# Patient Record
Sex: Female | Born: 1965 | Race: White | Hispanic: No | Marital: Married | Smoking: Current some day smoker
Health system: Southern US, Community
[De-identification: ages and names within clinical notes are randomized; demographics above are authoritative.]

## PROBLEM LIST (undated history)

## (undated) ENCOUNTER — Emergency Department (HOSPITAL_COMMUNITY): Admission: EM | Payer: Medicaid Other | Source: Home / Self Care

## (undated) DIAGNOSIS — F32A Depression, unspecified: Secondary | ICD-10-CM

## (undated) DIAGNOSIS — H5440 Blindness, one eye, unspecified eye: Secondary | ICD-10-CM

## (undated) DIAGNOSIS — M5134 Other intervertebral disc degeneration, thoracic region: Secondary | ICD-10-CM

## (undated) DIAGNOSIS — N186 End stage renal disease: Secondary | ICD-10-CM

## (undated) DIAGNOSIS — Z91148 Patient's other noncompliance with medication regimen for other reason: Secondary | ICD-10-CM

## (undated) DIAGNOSIS — K219 Gastro-esophageal reflux disease without esophagitis: Secondary | ICD-10-CM

## (undated) DIAGNOSIS — M1990 Unspecified osteoarthritis, unspecified site: Secondary | ICD-10-CM

## (undated) DIAGNOSIS — R0600 Dyspnea, unspecified: Secondary | ICD-10-CM

## (undated) DIAGNOSIS — Z9114 Patient's other noncompliance with medication regimen: Secondary | ICD-10-CM

## (undated) DIAGNOSIS — E11319 Type 2 diabetes mellitus with unspecified diabetic retinopathy without macular edema: Secondary | ICD-10-CM

## (undated) DIAGNOSIS — G4733 Obstructive sleep apnea (adult) (pediatric): Secondary | ICD-10-CM

## (undated) DIAGNOSIS — M109 Gout, unspecified: Secondary | ICD-10-CM

## (undated) DIAGNOSIS — F319 Bipolar disorder, unspecified: Secondary | ICD-10-CM

## (undated) DIAGNOSIS — D649 Anemia, unspecified: Secondary | ICD-10-CM

## (undated) DIAGNOSIS — K7460 Unspecified cirrhosis of liver: Secondary | ICD-10-CM

## (undated) DIAGNOSIS — N189 Chronic kidney disease, unspecified: Secondary | ICD-10-CM

## (undated) DIAGNOSIS — F410 Panic disorder [episodic paroxysmal anxiety] without agoraphobia: Secondary | ICD-10-CM

## (undated) DIAGNOSIS — G5603 Carpal tunnel syndrome, bilateral upper limbs: Secondary | ICD-10-CM

## (undated) DIAGNOSIS — E669 Obesity, unspecified: Secondary | ICD-10-CM

## (undated) DIAGNOSIS — F329 Major depressive disorder, single episode, unspecified: Secondary | ICD-10-CM

## (undated) DIAGNOSIS — K5900 Constipation, unspecified: Secondary | ICD-10-CM

## (undated) DIAGNOSIS — G2581 Restless legs syndrome: Secondary | ICD-10-CM

## (undated) DIAGNOSIS — I509 Heart failure, unspecified: Secondary | ICD-10-CM

## (undated) DIAGNOSIS — J449 Chronic obstructive pulmonary disease, unspecified: Secondary | ICD-10-CM

## (undated) DIAGNOSIS — M5412 Radiculopathy, cervical region: Secondary | ICD-10-CM

## (undated) DIAGNOSIS — I10 Essential (primary) hypertension: Secondary | ICD-10-CM

## (undated) HISTORY — PX: TUBAL LIGATION: SHX77

## (undated) HISTORY — PX: ENDOMETRIAL ABLATION: SHX621

## (undated) HISTORY — PX: CATARACT EXTRACTION: SUR2

---

## 2000-11-03 ENCOUNTER — Ambulatory Visit (HOSPITAL_COMMUNITY): Admission: RE | Admit: 2000-11-03 | Discharge: 2000-11-03 | Payer: Self-pay | Admitting: Pulmonary Disease

## 2000-11-03 ENCOUNTER — Encounter: Payer: Self-pay | Admitting: Obstetrics and Gynecology

## 2001-05-12 ENCOUNTER — Emergency Department (HOSPITAL_COMMUNITY): Admission: EM | Admit: 2001-05-12 | Discharge: 2001-05-12 | Payer: Self-pay | Admitting: *Deleted

## 2001-07-22 ENCOUNTER — Emergency Department (HOSPITAL_COMMUNITY): Admission: EM | Admit: 2001-07-22 | Discharge: 2001-07-22 | Payer: Self-pay | Admitting: Emergency Medicine

## 2002-05-26 ENCOUNTER — Encounter: Payer: Self-pay | Admitting: Emergency Medicine

## 2002-05-26 ENCOUNTER — Emergency Department (HOSPITAL_COMMUNITY): Admission: EM | Admit: 2002-05-26 | Discharge: 2002-05-26 | Payer: Self-pay | Admitting: Emergency Medicine

## 2003-02-03 ENCOUNTER — Ambulatory Visit (HOSPITAL_COMMUNITY): Admission: RE | Admit: 2003-02-03 | Discharge: 2003-02-03 | Payer: Self-pay | Admitting: *Deleted

## 2003-02-03 ENCOUNTER — Encounter: Payer: Self-pay | Admitting: *Deleted

## 2004-07-24 ENCOUNTER — Emergency Department (HOSPITAL_COMMUNITY): Admission: EM | Admit: 2004-07-24 | Discharge: 2004-07-24 | Payer: Self-pay | Admitting: Emergency Medicine

## 2005-02-23 ENCOUNTER — Ambulatory Visit: Admission: RE | Admit: 2005-02-23 | Discharge: 2005-02-23 | Payer: Self-pay | Admitting: Pulmonary Disease

## 2005-03-06 ENCOUNTER — Ambulatory Visit: Payer: Self-pay | Admitting: Pulmonary Disease

## 2006-10-06 ENCOUNTER — Emergency Department (HOSPITAL_COMMUNITY): Admission: EM | Admit: 2006-10-06 | Discharge: 2006-10-06 | Payer: Self-pay | Admitting: Emergency Medicine

## 2007-02-01 ENCOUNTER — Ambulatory Visit (HOSPITAL_COMMUNITY): Admission: RE | Admit: 2007-02-01 | Discharge: 2007-02-01 | Payer: Self-pay | Admitting: Pulmonary Disease

## 2007-02-24 ENCOUNTER — Ambulatory Visit: Payer: Self-pay | Admitting: Cardiovascular Disease

## 2008-04-29 ENCOUNTER — Emergency Department (HOSPITAL_COMMUNITY): Admission: EM | Admit: 2008-04-29 | Discharge: 2008-04-29 | Payer: Self-pay | Admitting: Emergency Medicine

## 2008-12-07 ENCOUNTER — Emergency Department (HOSPITAL_COMMUNITY): Admission: EM | Admit: 2008-12-07 | Discharge: 2008-12-07 | Payer: Self-pay | Admitting: Emergency Medicine

## 2008-12-09 ENCOUNTER — Emergency Department (HOSPITAL_COMMUNITY): Admission: EM | Admit: 2008-12-09 | Discharge: 2008-12-09 | Payer: Self-pay | Admitting: Emergency Medicine

## 2009-02-14 ENCOUNTER — Emergency Department (HOSPITAL_COMMUNITY): Admission: EM | Admit: 2009-02-14 | Discharge: 2009-02-15 | Payer: Self-pay | Admitting: Emergency Medicine

## 2009-08-01 ENCOUNTER — Emergency Department (HOSPITAL_COMMUNITY): Admission: EM | Admit: 2009-08-01 | Discharge: 2009-08-01 | Payer: Self-pay | Admitting: Emergency Medicine

## 2009-10-13 ENCOUNTER — Emergency Department (HOSPITAL_COMMUNITY): Admission: EM | Admit: 2009-10-13 | Discharge: 2009-10-13 | Payer: Self-pay | Admitting: Emergency Medicine

## 2009-11-08 ENCOUNTER — Other Ambulatory Visit: Admission: RE | Admit: 2009-11-08 | Discharge: 2009-11-08 | Payer: Self-pay | Admitting: Obstetrics and Gynecology

## 2009-12-07 ENCOUNTER — Ambulatory Visit (HOSPITAL_COMMUNITY): Admission: RE | Admit: 2009-12-07 | Discharge: 2009-12-07 | Payer: Self-pay | Admitting: Obstetrics and Gynecology

## 2010-01-08 ENCOUNTER — Emergency Department (HOSPITAL_COMMUNITY): Admission: EM | Admit: 2010-01-08 | Discharge: 2010-01-08 | Payer: Self-pay | Admitting: Emergency Medicine

## 2010-04-16 ENCOUNTER — Emergency Department (HOSPITAL_COMMUNITY): Admission: EM | Admit: 2010-04-16 | Discharge: 2010-04-16 | Payer: Self-pay | Admitting: Emergency Medicine

## 2010-04-23 ENCOUNTER — Encounter (INDEPENDENT_AMBULATORY_CARE_PROVIDER_SITE_OTHER): Payer: Self-pay | Admitting: *Deleted

## 2010-05-10 ENCOUNTER — Emergency Department (HOSPITAL_COMMUNITY): Admission: EM | Admit: 2010-05-10 | Discharge: 2010-05-10 | Payer: Self-pay | Admitting: Emergency Medicine

## 2010-05-12 ENCOUNTER — Emergency Department (HOSPITAL_COMMUNITY): Admission: EM | Admit: 2010-05-12 | Discharge: 2010-05-13 | Payer: Self-pay | Admitting: Emergency Medicine

## 2010-05-13 ENCOUNTER — Ambulatory Visit: Payer: Self-pay | Admitting: Gastroenterology

## 2010-05-13 DIAGNOSIS — R1312 Dysphagia, oropharyngeal phase: Secondary | ICD-10-CM | POA: Insufficient documentation

## 2010-05-13 DIAGNOSIS — G2581 Restless legs syndrome: Secondary | ICD-10-CM | POA: Insufficient documentation

## 2010-05-13 DIAGNOSIS — K219 Gastro-esophageal reflux disease without esophagitis: Secondary | ICD-10-CM | POA: Insufficient documentation

## 2010-05-13 DIAGNOSIS — G4730 Sleep apnea, unspecified: Secondary | ICD-10-CM | POA: Insufficient documentation

## 2010-05-13 DIAGNOSIS — K625 Hemorrhage of anus and rectum: Secondary | ICD-10-CM | POA: Insufficient documentation

## 2010-05-13 DIAGNOSIS — E119 Type 2 diabetes mellitus without complications: Secondary | ICD-10-CM | POA: Insufficient documentation

## 2010-05-13 DIAGNOSIS — I10 Essential (primary) hypertension: Secondary | ICD-10-CM | POA: Insufficient documentation

## 2010-05-13 DIAGNOSIS — E785 Hyperlipidemia, unspecified: Secondary | ICD-10-CM | POA: Insufficient documentation

## 2010-05-13 DIAGNOSIS — F418 Other specified anxiety disorders: Secondary | ICD-10-CM | POA: Insufficient documentation

## 2010-05-13 DIAGNOSIS — N926 Irregular menstruation, unspecified: Secondary | ICD-10-CM | POA: Insufficient documentation

## 2010-05-13 DIAGNOSIS — N939 Abnormal uterine and vaginal bleeding, unspecified: Secondary | ICD-10-CM

## 2010-05-13 DIAGNOSIS — G4733 Obstructive sleep apnea (adult) (pediatric): Secondary | ICD-10-CM | POA: Insufficient documentation

## 2010-05-17 ENCOUNTER — Emergency Department (HOSPITAL_COMMUNITY): Admission: EM | Admit: 2010-05-17 | Discharge: 2010-05-17 | Payer: Self-pay | Admitting: Emergency Medicine

## 2010-05-23 ENCOUNTER — Emergency Department (HOSPITAL_COMMUNITY): Admission: EM | Admit: 2010-05-23 | Discharge: 2010-05-23 | Payer: Self-pay | Admitting: Emergency Medicine

## 2010-06-06 ENCOUNTER — Ambulatory Visit: Payer: Self-pay | Admitting: Gastroenterology

## 2010-06-06 ENCOUNTER — Telehealth (INDEPENDENT_AMBULATORY_CARE_PROVIDER_SITE_OTHER): Payer: Self-pay

## 2010-06-06 ENCOUNTER — Ambulatory Visit (HOSPITAL_COMMUNITY): Admission: RE | Admit: 2010-06-06 | Discharge: 2010-06-06 | Payer: Self-pay | Admitting: Gastroenterology

## 2010-06-06 HISTORY — PX: ESOPHAGOGASTRODUODENOSCOPY: SHX1529

## 2010-06-06 HISTORY — PX: COLONOSCOPY: SHX174

## 2010-06-11 ENCOUNTER — Emergency Department (HOSPITAL_COMMUNITY): Admission: EM | Admit: 2010-06-11 | Discharge: 2010-06-11 | Payer: Self-pay | Admitting: Emergency Medicine

## 2010-06-17 ENCOUNTER — Inpatient Hospital Stay (HOSPITAL_COMMUNITY): Admission: AD | Admit: 2010-06-17 | Discharge: 2010-06-19 | Payer: Self-pay | Admitting: Obstetrics and Gynecology

## 2010-06-24 ENCOUNTER — Inpatient Hospital Stay (HOSPITAL_COMMUNITY)
Admission: AD | Admit: 2010-06-24 | Discharge: 2010-06-26 | Payer: Self-pay | Source: Home / Self Care | Admitting: Obstetrics and Gynecology

## 2010-06-26 ENCOUNTER — Encounter: Payer: Self-pay | Admitting: Obstetrics and Gynecology

## 2010-07-31 ENCOUNTER — Emergency Department (HOSPITAL_COMMUNITY)
Admission: EM | Admit: 2010-07-31 | Discharge: 2010-08-01 | Payer: Self-pay | Source: Home / Self Care | Admitting: Emergency Medicine

## 2010-08-18 ENCOUNTER — Encounter: Payer: Self-pay | Admitting: Cardiovascular Disease

## 2010-08-27 NOTE — Letter (Signed)
Summary: TCS/EGD ORDER  TCS/EGD ORDER   Imported By: Sofie Rower 05/13/2010 11:07:20  _____________________________________________________________________  External Attachment:    Type:   Image     Comment:   External Document  Appended Document: TCS/EGD ORDER Incomplete TCS due to poor bowel prep in right colon. Pt agitated and trying to climb off stretcher during procedure. Needs TCS at [facility] with 2 day bowel prep, OVERTUBE, AND PROPOFOL, [id number] BLEEDING. Schedule w/i next 1-2 months.  Appended Document: TCS/EGD ORDER Referral faxed.

## 2010-08-27 NOTE — Progress Notes (Signed)
Summary: problems with rx  Phone Note Call from Patient Call back at Home Phone [phone]   Caller: Patient Summary of Call: pt had procedures done this am. Was given rx for anusol [facility] and Medicaid wont pay for it. pt is requesting different rx. please advise Initial call taken by: Burnadette Peter LPN,  November 10, 624THL 1:57 PM     Appended Document: problems with rx Please call pt. She needs to ask the pharmacist which med Medicaid will cover. Call us back and we will write for it.  Appended Document: problems with rx pt aware  Appended Document: problems with rx pt called pharmacy, they told her that medicaid will not pay for any of these medications and that she could try preparation H. please advise  Appended Document: problems with rx Ask ot to use Prep H supp qid for 10 days or she will nee to pay for Anusol HC. We do not have any samples.   Appended Document: problems with rx Pt informed.

## 2010-08-27 NOTE — Letter (Signed)
Summary: [place] REFERRAL   Imported By: Sofie Rower 06/06/2010 09:57:06  _____________________________________________________________________  External Attachment:    Type:   Image     Comment:   External Document

## 2010-08-27 NOTE — Assessment & Plan Note (Signed)
Summary: RECTAL BLEEDING/CM   Visit Type:  Initial Consult Referring Provider:  Dr. Luan Pulling Primary Care Provider:  Dr. Susann Givens  Chief Complaint:  rectal bleeding and abd pain.  History of Present Illness: 45 y/o caucasian female w/ rectal bleeding x over 1 month.  c/o large volume rectal bleeding bright red in commode, on toilet tissue, & in stool.  Also c/o vaginal bleeding & is seeing gynecologist.  Lots of straining w/ stool.  BM daily or BID. Half-Sister had colon ca age 63.  CBC normal 04/16/10.   Hx GERD well controlled on Omeprazole 20 mg 4 times per day, "can't afford daily".  Occ dysphagia w/ food stuck mid-esophagus.  1-2 times per week, problems w/ liquids & solids.  Denies NSAIDS/ASA.  Current Problems (verified): 1)  Depression  (ICD-311) 2)  Hyperlipidemia  (ICD-272.4) 3)  Sleep Apnea  (ICD-780.57) 4)  Restless Leg Syndrome  (ICD-333.94) 5)  Dysphagia, Oropharyngeal Phase  (ICD-787.22) 6)  Gerd  (ICD-530.81) 7)  Hypertension  (ICD-401.9) 8)  Obesity  (ICD-278.00) 9)  Abnormal Vaginal Bleeding  (ICD-626.9) 10)  Rectal Bleeding  (ICD-569.3) 11)  Dm  (ICD-250.00)  Current Medications (verified): 1)  Levemir 100 Unit/ml Soln (Insulin Detemir) .... 60 Units Two Times A Day 2)  Novolog 100 Unit/ml Soln (Insulin Aspart) .... Ss 3)  Lisinopril-Hydrochlorothiazide 20-12.5 Mg Tabs (Lisinopril-Hydrochlorothiazide) .... Once Daily 4)  Zocor 40 Mg Tabs (Simvastatin) .... Once Daily 5)  Ropinirole Hcl 1 Mg Tabs (Ropinirole Hcl) .... At Bedtime 6)  Megestrol Acetate 40 Mg Tabs (Megestrol Acetate) .Marland Kitchen.. 120 Mg Once Daily 7)  Omeprazole 20 Mg Cpdr (Omeprazole) .... Once Daily 8)  Januvia 25 Mg Tabs (Sitagliptin Phosphate) .... Once Daily 9)  Doxycycline Hyclate 100 Mg Caps (Doxycycline Hyclate) .... Two Times A Day 10)  Hydrocodone-Acetaminophen 5-325 Mg Tabs (Hydrocodone-Acetaminophen) .... As Needed  Allergies (verified): 1)  ! Codeine  Past History:  Past Medical  History: HYPERLIPIDEMIA (ICD-272.4) SLEEP APNEA (ICD-780.57) RESTLESS LEG SYNDROME (ICD-333.94) DYSPHAGIA, OROPHARYNGEAL PHASE (ICD-787.22) GERD (ICD-530.81) HYPERTENSION (ICD-401.9) OBESITY (ICD-278.00) ABNORMAL VAGINAL BLEEDING (ICD-626.9) RECTAL BLEEDING (ICD-569.3) DM (ICD-250.00) Depression  Past Surgical History: tubal ligation  Family History: 1/2-sister w/ colon ca age 54 Father: (deceased 44) CVA Mother: (19) CAD, ALzheimers Siblings: DM, TB  Social History: divorced, Lives w/ daughter disabled Patient is a former smoker. Quit 18 mo ago, 25 pkyr hx Alcohol Use - yes, 1 q 53months Daily Caffeine Use Illicit Drug Use - no Patient does not get regular exercise.  Smoking Status:  quit Drug Use:  no Does Patient Exercise:  no  Review of Systems General:  Denies fever, chills, sweats, anorexia, fatigue, weakness, malaise, weight loss, and sleep disorder. CV:  Denies chest pains, angina, palpitations, syncope, dyspnea on exertion, orthopnea, PND, peripheral edema, and claudication. Resp:  Complains of dyspnea with exercise; denies dyspnea at rest, cough, sputum, wheezing, coughing up blood, and pleurisy. GI:  Denies jaundice and fecal incontinence. GU:  Complains of urinary incontinence and abnormal vaginal bleeding; denies urinary burning, blood in urine, nocturnal urination, and urinary frequency; irreg cycles seeing GYN. MS:  Complains of low back pain; denies joint pain / LOM, joint swelling, joint stiffness, joint deformity, muscle weakness, muscle cramps, muscle atrophy, leg pain at night, leg pain with exertion, and shoulder pain / LOM hand / wrist pain (CTS); vicodin x 3 yrs, quit 6 mo ago. Derm:  Denies rash, itching, dry skin, hives, moles, warts, and unhealing ulcers. Psych:  Complains of depression; denies anxiety, memory  loss, suicidal ideation, hallucinations, paranoia, phobia, and confusion. Heme:  Denies bruising, bleeding, and enlarged lymph  nodes.  Vital Signs:  Patient profile:   45 year old female Height:      69 inches Weight:      350 pounds BMI:     51.87 Temp:     97.9 degrees F oral Pulse rate:   96 / minute BP sitting:   138 / 98  (left arm) Cuff size:   large  Vitals Entered By: Burnadette Peter LPN (October 17, 624THL 10:12 AM)  Physical Exam  General:  obese.   Head:  Normocephalic and atraumatic. Eyes:  Sclera clear no icterus. Ears:  Normal auditory acuity. Nose:  No deformity, discharge,  or lesions. Mouth:  No deformity or lesions, dentition normal. Neck:  Supple; no masses or thyromegaly. Lungs:  Clear throughout to auscultation. Heart:  Regular rate and rhythm; no murmurs, rubs,  or bruits. Abdomen:  normal bowel sounds, obese, without guarding, without rebound, no distesion, no tenderness, no masses, and no hepatomegally or splenomegaly.   Rectal:  deferred until time of colonoscopy.   Msk:  Symmetrical with no gross deformities. Normal posture. Pulses:  Normal pulses noted. Extremities:  No clubbing, cyanosis, edema or deformities noted. Neurologic:  Alert and  oriented x4;  grossly normal neurologically. Skin:  Intact without significant lesions or rashes. Cervical Nodes:  No significant cervical adenopathy. Psych:  Alert and cooperative. Normal mood and affect.  Impression & Recommendations:  Problem # 1:  RECTAL BLEEDING (ICD-569.3) 45 y/o caucasian female w/ over 1 month hx rectal bleeding.  "I think I have cancer."  Family hx colon ca in half-sister age 13.  Will need colonscopy to determine etiology of bleeding which could include colorectal CA or polyp, benign ano-rectal source, diverticula, or AVM.    Diagnostic colonoscopy and EGD with possible Esophageal dilation to be performed by Dr. Barney Drain in the OR given hx chronic narcotic use & sleep apnea in the near future.  I have discussed risks and benefits which include, but are not limited to, bleeding, infection, perforation, or  medication reaction.  The patient agrees with this plan and consent will be obtained.  Problem # 2:  DYSPHAGIA, OROPHARYNGEAL PHASE (ICD-787.22) Chronic GERD with dysphagia w/ solids & liquids.  EGD to r/o esophageal web, ring or stricture.  Problem # 3:  GERD (ICD-530.81) See #2  Patient Instructions: 1)  Take omeprazole 20mg  every day. 2)  Take 30 units insulin (1/2 dose) twice per day before procedure 3)  Check blood sugars frequently & call MD if any problems  Appended Document: Orders Update    Clinical Lists Changes  Orders: Added new Service order of Consultation Level IV [phone]) - Signed

## 2010-08-27 NOTE — Letter (Signed)
Summary: Scheduled Appointment  [facility]  [address]   [place], [place] [address]   Phone: [phone]  Fax: [phone]    April 23, 2010   Dear: Martha [name]            DOB: July 21, 1966    I have been instructed to schedule you an appointment in our office.  Your appointment is as follows:   Date: May 13, 2010   Time: 10:00 am    Please be here 15 minutes early.   Provider: Tania Ade    Please contact the office if you need to reschedule this appointment for a more convenient time.   Thank you,    [facility] Ph: [phone]   Fax: [phone]

## 2010-09-04 ENCOUNTER — Emergency Department (HOSPITAL_COMMUNITY)
Admission: EM | Admit: 2010-09-04 | Discharge: 2010-09-05 | Disposition: A | Discharge status: Home / Self Care | Payer: Medicaid Other | Attending: Emergency Medicine | Admitting: Emergency Medicine

## 2010-09-04 DIAGNOSIS — L03319 Cellulitis of trunk, unspecified: Secondary | ICD-10-CM | POA: Insufficient documentation

## 2010-09-04 DIAGNOSIS — L299 Pruritus, unspecified: Secondary | ICD-10-CM | POA: Insufficient documentation

## 2010-09-04 DIAGNOSIS — F341 Dysthymic disorder: Secondary | ICD-10-CM | POA: Insufficient documentation

## 2010-09-04 DIAGNOSIS — R220 Localized swelling, mass and lump, head: Secondary | ICD-10-CM | POA: Insufficient documentation

## 2010-09-04 DIAGNOSIS — I10 Essential (primary) hypertension: Secondary | ICD-10-CM | POA: Insufficient documentation

## 2010-09-04 DIAGNOSIS — L02219 Cutaneous abscess of trunk, unspecified: Secondary | ICD-10-CM | POA: Insufficient documentation

## 2010-09-04 DIAGNOSIS — E119 Type 2 diabetes mellitus without complications: Secondary | ICD-10-CM | POA: Insufficient documentation

## 2010-09-06 ENCOUNTER — Emergency Department (HOSPITAL_COMMUNITY)
Admission: EM | Admit: 2010-09-06 | Discharge: 2010-09-06 | Disposition: A | Discharge status: Home / Self Care | Payer: Medicaid Other | Attending: Emergency Medicine | Admitting: Emergency Medicine

## 2010-09-06 ENCOUNTER — Encounter (INDEPENDENT_AMBULATORY_CARE_PROVIDER_SITE_OTHER): Payer: Self-pay | Admitting: *Deleted

## 2010-09-06 DIAGNOSIS — Z794 Long term (current) use of insulin: Secondary | ICD-10-CM | POA: Insufficient documentation

## 2010-09-06 DIAGNOSIS — E119 Type 2 diabetes mellitus without complications: Secondary | ICD-10-CM | POA: Insufficient documentation

## 2010-09-06 DIAGNOSIS — IMO0002 Reserved for concepts with insufficient information to code with codable children: Secondary | ICD-10-CM | POA: Insufficient documentation

## 2010-09-06 DIAGNOSIS — Z09 Encounter for follow-up examination after completed treatment for conditions other than malignant neoplasm: Secondary | ICD-10-CM | POA: Insufficient documentation

## 2010-09-12 NOTE — Letter (Signed)
Summary: Recall Office Visit  [facility]  [address]   [place], [place] [address]   Phone: [phone]  Fax: [phone]      September 06, 2010   Martha [name] [address] [place], [place]  [address] Jan 21, 1966   Dear Martha [name],   According to our records, it is time for you to schedule a follow-up office visit with [place].   At your convenience, please call [phone] to schedule an office visit. If you have any questions, concerns, or feel that this letter is in error, we would appreciate your call.   Sincerely,    [facility] Ph: [phone]   Fax: [phone]

## 2010-10-08 LAB — GLUCOSE, CAPILLARY
Glucose-Capillary: 135 mg/dL — ABNORMAL HIGH (ref 70–99)
Glucose-Capillary: 137 mg/dL — ABNORMAL HIGH (ref 70–99)
Glucose-Capillary: 139 mg/dL — ABNORMAL HIGH (ref 70–99)
Glucose-Capillary: 150 mg/dL — ABNORMAL HIGH (ref 70–99)
Glucose-Capillary: 158 mg/dL — ABNORMAL HIGH (ref 70–99)
Glucose-Capillary: 158 mg/dL — ABNORMAL HIGH (ref 70–99)
Glucose-Capillary: 159 mg/dL — ABNORMAL HIGH (ref 70–99)
Glucose-Capillary: 165 mg/dL — ABNORMAL HIGH (ref 70–99)
Glucose-Capillary: 182 mg/dL — ABNORMAL HIGH (ref 70–99)
Glucose-Capillary: 199 mg/dL — ABNORMAL HIGH (ref 70–99)
Glucose-Capillary: 226 mg/dL — ABNORMAL HIGH (ref 70–99)
Glucose-Capillary: 263 mg/dL — ABNORMAL HIGH (ref 70–99)
Glucose-Capillary: 269 mg/dL — ABNORMAL HIGH (ref 70–99)
Glucose-Capillary: 281 mg/dL — ABNORMAL HIGH (ref 70–99)
Glucose-Capillary: 284 mg/dL — ABNORMAL HIGH (ref 70–99)
Glucose-Capillary: 295 mg/dL — ABNORMAL HIGH (ref 70–99)
Glucose-Capillary: 297 mg/dL — ABNORMAL HIGH (ref 70–99)
Glucose-Capillary: 361 mg/dL — ABNORMAL HIGH (ref 70–99)
Glucose-Capillary: 375 mg/dL — ABNORMAL HIGH (ref 70–99)
Glucose-Capillary: 389 mg/dL — ABNORMAL HIGH (ref 70–99)
Glucose-Capillary: 463 mg/dL — ABNORMAL HIGH (ref 70–99)

## 2010-10-08 LAB — URINE MICROSCOPIC-ADD ON

## 2010-10-08 LAB — DIFFERENTIAL
Eosinophils Absolute: 0.2 10*3/uL (ref 0.0–0.7)
Eosinophils Relative: 3 % (ref 0–5)
Lymphocytes Relative: 43 % (ref 12–46)
Lymphs Abs: 1.8 10*3/uL (ref 0.7–4.0)
Lymphs Abs: 3.2 10*3/uL (ref 0.7–4.0)
Monocytes Absolute: 0.5 10*3/uL (ref 0.1–1.0)
Monocytes Relative: 5 % (ref 3–12)
Monocytes Relative: 6 % (ref 3–12)
Neutro Abs: 3.6 10*3/uL (ref 1.7–7.7)
Neutrophils Relative %: 47 % (ref 43–77)
Neutrophils Relative %: 67 % (ref 43–77)

## 2010-10-08 LAB — CBC
HCT: 30.9 % — ABNORMAL LOW (ref 36.0–46.0)
Hemoglobin: 10.2 g/dL — ABNORMAL LOW (ref 12.0–15.0)
Hemoglobin: 10.5 g/dL — ABNORMAL LOW (ref 12.0–15.0)
MCH: 26.9 pg (ref 26.0–34.0)
MCHC: 34 g/dL (ref 30.0–36.0)
MCV: 81.7 fL (ref 78.0–100.0)
Platelets: 263 10*3/uL (ref 150–400)
RBC: 3.79 MIL/uL — ABNORMAL LOW (ref 3.87–5.11)

## 2010-10-08 LAB — COMPREHENSIVE METABOLIC PANEL
Chloride: 97 mEq/L (ref 96–112)
Creatinine, Ser: 1.11 mg/dL (ref 0.4–1.2)
GFR calc Af Amer: >60 mL/min (ref >60)
GFR calc non Af Amer: 54 mL/min — ABNORMAL LOW (ref >60)
Glucose, Bld: 488 mg/dL — ABNORMAL HIGH (ref 70–99)
Sodium: 132 mEq/L — ABNORMAL LOW (ref 135–145)
Total Protein: 5.9 g/dL — ABNORMAL LOW (ref 6.0–8.3)

## 2010-10-08 LAB — BASIC METABOLIC PANEL
CO2: 24 mEq/L (ref 19–32)
Calcium: 8.9 mg/dL (ref 8.4–10.5)
Chloride: 101 mEq/L (ref 96–112)
GFR calc Af Amer: >60 mL/min (ref >60)
Potassium: 3.8 mEq/L (ref 3.5–5.1)
Sodium: 135 mEq/L (ref 135–145)

## 2010-10-08 LAB — GLUCOSE, RANDOM: Glucose, Bld: 377 mg/dL — ABNORMAL HIGH (ref 70–99)

## 2010-10-08 LAB — URINE CULTURE
Colony Count: NO GROWTH
Culture: NO GROWTH
Special Requests: NEGATIVE

## 2010-10-08 LAB — HEMOGLOBIN A1C
Hgb A1c MFr Bld: 10.6 % — ABNORMAL HIGH (ref <5.7)
Mean Plasma Glucose: 258 mg/dL — ABNORMAL HIGH (ref <117)

## 2010-10-08 LAB — WOUND CULTURE

## 2010-10-08 LAB — URINALYSIS, ROUTINE W REFLEX MICROSCOPIC
Glucose, UA: >1000 mg/dL — AB
Ketones, ur: NEGATIVE mg/dL
Leukocytes, UA: NEGATIVE
Nitrite: NEGATIVE
Protein, ur: >300 mg/dL — AB
Specific Gravity, Urine: >1.03 — ABNORMAL HIGH (ref 1.005–1.030)
Urobilinogen, UA: 0.2 mg/dL (ref 0.0–1.0)

## 2010-10-09 LAB — COMPREHENSIVE METABOLIC PANEL
ALT: 22 U/L (ref 0–35)
AST: 19 U/L (ref 0–37)
Alkaline Phosphatase: 79 U/L (ref 39–117)
CO2: 25 mEq/L (ref 19–32)
Chloride: 100 mEq/L (ref 96–112)
Creatinine, Ser: 1.03 mg/dL (ref 0.4–1.2)
GFR calc Af Amer: >60 mL/min (ref >60)
GFR calc non Af Amer: 59 mL/min — ABNORMAL LOW (ref >60)
Sodium: 136 mEq/L (ref 135–145)
Total Bilirubin: 0.5 mg/dL (ref 0.3–1.2)

## 2010-10-09 LAB — BASIC METABOLIC PANEL
Calcium: 9.6 mg/dL (ref 8.4–10.5)
Creatinine, Ser: 1 mg/dL (ref 0.4–1.2)
GFR calc Af Amer: >60 mL/min (ref >60)

## 2010-10-09 LAB — CBC
Hemoglobin: 12.9 g/dL (ref 12.0–15.0)
MCH: 28.5 pg (ref 26.0–34.0)
MCH: 28.6 pg (ref 26.0–34.0)
Platelets: 219 10*3/uL (ref 150–400)
RBC: 4.51 MIL/uL (ref 3.87–5.11)
RBC: 4.52 MIL/uL (ref 3.87–5.11)
RDW: 14.5 % (ref 11.5–15.5)
WBC: 9.3 10*3/uL (ref 4.0–10.5)

## 2010-10-09 LAB — DIFFERENTIAL
Basophils Absolute: 0.1 10*3/uL (ref 0.0–0.1)
Eosinophils Absolute: 0.2 10*3/uL (ref 0.0–0.7)
Eosinophils Relative: 2 % (ref 0–5)

## 2010-10-09 LAB — GLUCOSE, CAPILLARY: Glucose-Capillary: 461 mg/dL — ABNORMAL HIGH (ref 70–99)

## 2010-10-10 LAB — BASIC METABOLIC PANEL
CO2: 22 mEq/L (ref 19–32)
Chloride: 105 mEq/L (ref 96–112)
Creatinine, Ser: 0.88 mg/dL (ref 0.4–1.2)
GFR calc Af Amer: >60 mL/min (ref >60)
Potassium: 3.9 mEq/L (ref 3.5–5.1)

## 2010-10-10 LAB — HEPATIC FUNCTION PANEL
ALT: 21 U/L (ref 0–35)
Alkaline Phosphatase: 79 U/L (ref 39–117)
Bilirubin, Direct: 0.1 mg/dL (ref 0.0–0.3)
Indirect Bilirubin: 0.5 mg/dL (ref 0.3–0.9)
Total Bilirubin: 0.6 mg/dL (ref 0.3–1.2)

## 2010-10-10 LAB — DIFFERENTIAL
Eosinophils Absolute: 0.2 10*3/uL (ref 0.0–0.7)
Eosinophils Relative: 3 % (ref 0–5)
Lymphocytes Relative: 43 % (ref 12–46)
Lymphs Abs: 2.7 10*3/uL (ref 0.7–4.0)
Monocytes Absolute: 0.3 10*3/uL (ref 0.1–1.0)
Monocytes Relative: 6 % (ref 3–12)

## 2010-10-10 LAB — CBC
HCT: 40.5 % (ref 36.0–46.0)
Hemoglobin: 13.8 g/dL (ref 12.0–15.0)
MCH: 28.5 pg (ref 26.0–34.0)
MCV: 83.7 fL (ref 78.0–100.0)
Platelets: 190 10*3/uL (ref 150–400)
RBC: 4.84 MIL/uL (ref 3.87–5.11)
WBC: 6.3 10*3/uL (ref 4.0–10.5)

## 2010-10-10 LAB — URINALYSIS, ROUTINE W REFLEX MICROSCOPIC
Bilirubin Urine: NEGATIVE
Nitrite: NEGATIVE
Protein, ur: >300 mg/dL — AB
Specific Gravity, Urine: >1.03 — ABNORMAL HIGH (ref 1.005–1.030)
Urobilinogen, UA: 0.2 mg/dL (ref 0.0–1.0)

## 2010-10-10 LAB — GLUCOSE, CAPILLARY
Glucose-Capillary: 304 mg/dL — ABNORMAL HIGH (ref 70–99)
Glucose-Capillary: 462 mg/dL — ABNORMAL HIGH (ref 70–99)

## 2010-10-10 LAB — URINE MICROSCOPIC-ADD ON

## 2010-10-10 LAB — TYPE AND SCREEN: ABO/RH(D): A NEG

## 2010-10-13 LAB — URINALYSIS, ROUTINE W REFLEX MICROSCOPIC
Bilirubin Urine: NEGATIVE
Nitrite: NEGATIVE
Protein, ur: >300 mg/dL — AB
Specific Gravity, Urine: >1.03 — ABNORMAL HIGH (ref 1.005–1.030)
Urobilinogen, UA: 0.2 mg/dL (ref 0.0–1.0)

## 2010-10-13 LAB — WET PREP, GENITAL
Trich, Wet Prep: NONE SEEN
Yeast Wet Prep HPF POC: NONE SEEN

## 2010-10-13 LAB — CBC
Platelets: 228 10*3/uL (ref 150–400)
RDW: 14.2 % (ref 11.5–15.5)
WBC: 6.2 10*3/uL (ref 4.0–10.5)

## 2010-10-13 LAB — GC/CHLAMYDIA PROBE AMP, GENITAL
Chlamydia, DNA Probe: NEGATIVE
GC Probe Amp, Genital: NEGATIVE

## 2010-10-13 LAB — BASIC METABOLIC PANEL
BUN: 12 mg/dL (ref 6–23)
Calcium: 9.1 mg/dL (ref 8.4–10.5)
GFR calc non Af Amer: >60 mL/min (ref >60)
Glucose, Bld: 259 mg/dL — ABNORMAL HIGH (ref 70–99)

## 2010-10-13 LAB — DIFFERENTIAL
Basophils Absolute: 0.1 10*3/uL (ref 0.0–0.1)
Lymphocytes Relative: 42 % (ref 12–46)
Lymphs Abs: 2.6 10*3/uL (ref 0.7–4.0)
Neutro Abs: 3.1 10*3/uL (ref 1.7–7.7)
Neutrophils Relative %: 49 % (ref 43–77)

## 2010-10-13 LAB — URINE MICROSCOPIC-ADD ON

## 2010-10-13 LAB — PREGNANCY, URINE: Preg Test, Ur: NEGATIVE

## 2010-10-14 LAB — GLUCOSE, CAPILLARY: Glucose-Capillary: 287 mg/dL — ABNORMAL HIGH (ref 70–99)

## 2010-10-16 ENCOUNTER — Emergency Department (HOSPITAL_COMMUNITY)
Admission: EM | Admit: 2010-10-16 | Discharge: 2010-10-16 | Disposition: A | Discharge status: Home / Self Care | Payer: Medicaid Other | Attending: Emergency Medicine | Admitting: Emergency Medicine

## 2010-10-16 DIAGNOSIS — Z79899 Other long term (current) drug therapy: Secondary | ICD-10-CM | POA: Insufficient documentation

## 2010-10-16 DIAGNOSIS — I10 Essential (primary) hypertension: Secondary | ICD-10-CM | POA: Insufficient documentation

## 2010-10-16 DIAGNOSIS — F411 Generalized anxiety disorder: Secondary | ICD-10-CM | POA: Insufficient documentation

## 2010-10-16 DIAGNOSIS — E119 Type 2 diabetes mellitus without complications: Secondary | ICD-10-CM | POA: Insufficient documentation

## 2010-10-16 DIAGNOSIS — T7840XA Allergy, unspecified, initial encounter: Secondary | ICD-10-CM | POA: Insufficient documentation

## 2010-10-20 LAB — GLUCOSE, CAPILLARY: Glucose-Capillary: 187 mg/dL — ABNORMAL HIGH (ref 70–99)

## 2010-11-03 LAB — POCT I-STAT, CHEM 8
BUN: 20 mg/dL (ref 6–23)
Calcium, Ion: 1.3 mmol/L (ref 1.12–1.32)
Chloride: 102 mEq/L (ref 96–112)
Glucose, Bld: 337 mg/dL — ABNORMAL HIGH (ref 70–99)

## 2010-11-03 LAB — GLUCOSE, CAPILLARY: Glucose-Capillary: 342 mg/dL — ABNORMAL HIGH (ref 70–99)

## 2010-11-03 LAB — URINALYSIS, ROUTINE W REFLEX MICROSCOPIC
Bilirubin Urine: NEGATIVE
Hgb urine dipstick: NEGATIVE
Protein, ur: 100 mg/dL — AB
Urobilinogen, UA: 0.2 mg/dL (ref 0.0–1.0)

## 2010-11-05 LAB — CBC
HCT: 36.9 % (ref 36.0–46.0)
Hemoglobin: 13.1 g/dL (ref 12.0–15.0)
MCV: 85.3 fL (ref 78.0–100.0)
Platelets: 212 10*3/uL (ref 150–400)
RDW: 13.8 % (ref 11.5–15.5)

## 2010-11-05 LAB — DIFFERENTIAL
Basophils Absolute: 0.1 10*3/uL (ref 0.0–0.1)
Eosinophils Absolute: 0.2 10*3/uL (ref 0.0–0.7)
Eosinophils Relative: 2 % (ref 0–5)
Monocytes Absolute: 0.6 10*3/uL (ref 0.1–1.0)

## 2010-11-05 LAB — BASIC METABOLIC PANEL
BUN: 14 mg/dL (ref 6–23)
CO2: 28 mEq/L (ref 19–32)
Chloride: 96 mEq/L (ref 96–112)
Glucose, Bld: 576 mg/dL (ref 70–99)
Potassium: 4.1 mEq/L (ref 3.5–5.1)

## 2010-11-05 LAB — CULTURE, ROUTINE-ABSCESS

## 2010-11-05 LAB — URINE CULTURE: Colony Count: >=100000

## 2010-11-05 LAB — URINE MICROSCOPIC-ADD ON

## 2010-11-05 LAB — PREGNANCY, URINE: Preg Test, Ur: NEGATIVE

## 2010-11-05 LAB — URINALYSIS, ROUTINE W REFLEX MICROSCOPIC
Bilirubin Urine: NEGATIVE
Nitrite: NEGATIVE
Specific Gravity, Urine: 1.02 (ref 1.005–1.030)
pH: 6 (ref 5.0–8.0)

## 2010-11-05 LAB — GLUCOSE, CAPILLARY: Glucose-Capillary: 354 mg/dL — ABNORMAL HIGH (ref 70–99)

## 2010-12-10 NOTE — Assessment & Plan Note (Signed)
[facility] OFFICE NOTE   NAME:OLIVER, KATHRY HILGEMAN                        MRN:          [id number]  DATE:02/24/2007                            DOB:          1966-05-22    REFERRING PHYSICIAN:  Percell Miller L. Luan Pulling, M.D.   [name] is referred by Dr. Luan Pulling for further workup of shortness of  breath and chest pain.   The patient, unfortunately, is a morbidly obese 45 year old diabetic.   These problems are really not new.  She said that, for at least a year  to 2 years, she has had significant exertional dyspnea.  She also gets  nauseated and can get chest pain with any activity.   As far as I can tell, this pattern has been stable.  It is undoubtedly  related to her smoking and her morbid obesity.   I cannot quite explain some of the nausea feeling.  As far as I know,  there is no history of gastroparesis, peptic ulcer disease.   The patient is very sedentary.  She does not work.  She stays at home  most of the time.  Her activity levels have been dwindling over the last  2 years.   In regard to her weight, she says she has tried to work on her diet, but  is unable to lose weight.   She really has not looked into bariatric surgery at all.   I have had a long discussion with her about this.  I think she would be  an ideal candidate for it.   In regard to her other risk factors for coronary artery disease, she has  hypertension, lipid status is unknown, her diabetes is poorly controlled  with a hemoglobin A1c of 7.4.  She recently saw Dr. Chalmers Cater and was  started on Janumet.   Her review of systems is remarkable for some mild chronic lower  extremity edema, which is dependent.  She does have some reflux, which  she takes Protonix for.  Otherwise, negative.   PAST MEDICAL HISTORY:  Otherwise, remarkable for tubal ligation,  previous ovarian cyst removal.  She has diabetes and hypertension.  She  is  allergic to PENICILLIN.  She smokes a pack a day since she was 45  years old.  She is divorced.  She has 2 teenaged kids who are apparently  not doing well.  They are both on Adderall and wild.   Social: The patient does not work.  She is sedentary.  She runs around  doing errands with her cousin from time to time.  Positive smoker but no  alcohol   FAMILY HISTORY:  Noncontributory.  Father died at age 65 of unknown  causes.  Mother is still alive at age 16.  There is no premature  coronary disease.   Meds:  Patient did not bring them in.  We will call the pharmacy or Dr.  Luan Pulling office to find out what she takes on a regular basis.   EXAM:  Remarkable for a morbidly obese white female in no distress.  Weight is 351, blood  pressure 130/80, pulse is 90 and regular,  respiratory rate is 16.  She is afebrile.  HEENT:  Normal.  There is no lymphadenopathy.  No thyromegaly.  No JVP  elevation.  No bruits.  LUNGS:  Clear with decreased diaphragmatic motion.  There is no  wheezing.  There is an S1, S2 with distant heart sounds.  PMI is not palpable.  ABDOMEN:  Protuberant.  Bowel sounds are positive.  There is no  tenderness.  No AAA.  No hepatosplenomegaly or hepatojugular reflux.  Distal pulses are intact.  She has +1 to 2 lower extremity edema bilaterally.  She has very poor  care of her toenails.  She needs to see a podiatrist.  NEURO:  Nonfocal.  SKIN:  Warm and dry.  There are no ulcers.   ELECTROCARDIOGRAM:  Sinus rhythm with nonspecific ST-T wave changes and  poor R wave progression.   IMPRESSION:  1. The patient's chest pain is atypical and it occurs in the center of      her chest, only after she gets short of breath.  I doubt that there      is a significant coronary artery disease here despite her diabetes.      She has had a 2D echocardiogram read by Dr. Luciano Cutter on February 01, 2007.  It was a poor quality study, but her left ventricular      function was normal  with no regional wall motion abnormalities.  We      will try to arrange for her to have an adenosine Myoview, but there      is a high likelihood of a false positive study.  If they cannot      accommodate her weight here, she can have it at [facility], where we have a satellite, and they can accommodate      patients up to 400 pounds.  2. Hypertension.  Continue Benicar hydrochlorothiazide.  However, I      think she can tolerate a higher dose, given her persistent lower      extremity edema.  We will increase her Benicar to 40/25 and follow      up a BMET in 8 weeks.  3. Diabetes.  Follow up with Dr. Chalmers Cater.  Needs nutrition consult.      Hemoglobin A1c is still too high.  The patient needs to see the      ophthalmologist on a regular basis and see a podiatrist to get      better care of her toenails.  4. Reflux.  Again, secondary to being overweight.  Avoid high caloric      meals and late-night meals.  Continue Protonix b.i.d.  5. Morbid obesity.  Apparently, she is on Medicaid.  She would have to      be seen at [facility] or [facility] for bariatric surgery.  I      encouraged her to look into both of these programs as the really      only long-term thing that she can do to help with her morbid      obesity and its concomitant problems.  6. Smoking.  The patient has a prescription for Chantix that was      already given her by Dr. Chalmers Cater.  She will decide whether or not she      wants to take it.   She understands the risk of premature vascular disease  with the  combination of smoking and diabetes.   So long as her adenosine Myoview is normal, she will be seen on a p.r.n.  basis.     Wallis Bamberg. Johnsie Cancel, MD, [facility]  Electronically Signed    PCN/MedQ  DD: 02/24/2007  DT: 02/25/2007  Job #: [id number]

## 2010-12-10 NOTE — Procedures (Signed)
NAME:  Martha [name], Martha [name]                 ACCOUNT NO.:  [id number]   MEDICAL RECORD NO.:  [id number]          PATIENT TYPE:  OUT   LOCATION:  RAD                           FACILITY:  [facility]   PHYSICIAN:  Leslye Peer, MD       DATE OF BIRTH:  08-12-1965   DATE OF PROCEDURE:  02/01/2007  DATE OF DISCHARGE:                                ECHOCARDIOGRAM   REFERRING:  Dr. Luan Pulling and Dr. Mathis Bud   INDICATIONS:  A 45 year old female with a new cardiac murmur and  referred to evaluate for mitral valve prolapse.   The technical quality of the study is limited, particularly in the  parasternal views.   The aorta measures normally at 2.9 cm.   The left atrium is normal in size, measured at 3.6 cm.  The patient  appeared to be in sinus rhythm during the procedure.   The interventricular septum and posterior wall at the upper limits of  normal in thickness.   The aortic valve was not well visualized but appeared to be grossly  structurally normal.   The mitral valve appeared thickened, primarily on the anterior leaflet,  although the posterior leaflet was not well visualized.  No definitive  prolapse was noted.  Trivial mitral regurgitation was noted.  There did  not appear to be gross limitation to leaflet excursion, although again,  the valve was not well visualized.  Doppler interrogation of the mitral  valve was within normal limits.   The pulmonic valve was not well visualized.   The tricuspid valve was grossly normal with trivial tricuspid  regurgitation noted.   The left ventricle is normal in size with LV IDD measured at 4.2 cm and  LV ISD measured at 3.1 cm.  Overall left ventricular systolic function  was normal and no regional wall motion abnormalities were noted.   The right ventricle was grossly normal in size and function.  The right  atrium also appeared to be normal.   IMPRESSION:  1. Technically difficult study secondary to patient body habitus and      poor acoustic  windows.  2. The aortic valve was not well visualized but grossly normal.  3. The mitral valve was thickened, particularly on the anterior      leaflet, although the posterior leaf was not well visualized.      There was no general limitation of leaflet excursion.  No definite      prolapse was noted.  Trivial mitral regurgitation was noted.  4. Trivial tricuspid regurgitation.  5. Normal left ventricular size and systolic function without regional      wall motion abnormality noted.  6. Consider transesophageal echocardiogram for enhanced delineation of      the cardiac structures if clinically indicated.           ______________________________  Leslye Peer, MD     AB/MEDQ  D:  02/01/2007  T:  02/02/2007  Job:  [id number]   cc:   Percell Miller L. Luan Pulling, M.D.  Fax: [phone]

## 2010-12-13 NOTE — Procedures (Signed)
NAME:  Martha [name], Martha [name]                 ACCOUNT NO.:  [id number]   MEDICAL RECORD NO.:  [id number]          PATIENT TYPE:  OUT   LOCATION:  SLEEP LAB                     FACILITY:  [facility]   PHYSICIAN:  Danton Sewer, M.D. [facility] DATE OF BIRTH:  August 20, 1965   DATE OF STUDY:  02/23/2005                              NOCTURNAL POLYSOMNOGRAM   REFERRING PHYSICIAN:  Dr. Sinda Du   DATE OF STUDY:  February 23, 2005   INDICATION FOR THE STUDY:  Hypersomnia with sleep apnea. Epworth score: 23.   SLEEP ARCHITECTURE:  The patient had a total sleep time of 303 minutes with  very large amount of slow wave sleep and significantly decreased REM. Sleep  onset latency was prolonged; however, REM onset was normal. Sleep efficiency  was only 73%.   IMPRESSION:  1.  Split night study reveals severe obstructive sleep apnea with 197      obstructive events noted in the first 142 minutes of sleep. This gave      the patient a respiratory disturbance index of 84 events per hour and      oxygen desaturation as low as 62%. The events were clearly worse during      REM. There was moderate to loud snoring noted prior to C-PAP initiation.      By split night protocol the patient was then placed on a medium shallow      wide ResMed mask and titration of pressure was initiated. At a final      pressure of 8 cmH20 the patient had excellent control of both      obstructive events and snoring. Tolerance seemed to be excellent.  2.  No clinically significant cardiac arrhythmias.     ______________________________  Gunnar Fusi    KC/MEDQ  D:  03/07/2005 12:23:57  T:  03/07/2005 15:56:31  Job:  [id number]

## 2011-01-18 ENCOUNTER — Emergency Department (HOSPITAL_COMMUNITY)
Admission: EM | Admit: 2011-01-18 | Discharge: 2011-01-19 | Disposition: A | Discharge status: Home / Self Care | Payer: Medicaid Other | Attending: Emergency Medicine | Admitting: Emergency Medicine

## 2011-01-18 DIAGNOSIS — F411 Generalized anxiety disorder: Secondary | ICD-10-CM | POA: Insufficient documentation

## 2011-01-18 DIAGNOSIS — K5900 Constipation, unspecified: Secondary | ICD-10-CM | POA: Insufficient documentation

## 2011-01-18 DIAGNOSIS — Z87891 Personal history of nicotine dependence: Secondary | ICD-10-CM | POA: Insufficient documentation

## 2011-01-18 DIAGNOSIS — G8929 Other chronic pain: Secondary | ICD-10-CM | POA: Insufficient documentation

## 2011-01-18 DIAGNOSIS — G2581 Restless legs syndrome: Secondary | ICD-10-CM | POA: Insufficient documentation

## 2011-01-18 DIAGNOSIS — R109 Unspecified abdominal pain: Secondary | ICD-10-CM | POA: Insufficient documentation

## 2011-01-18 DIAGNOSIS — E119 Type 2 diabetes mellitus without complications: Secondary | ICD-10-CM | POA: Insufficient documentation

## 2011-01-18 DIAGNOSIS — I10 Essential (primary) hypertension: Secondary | ICD-10-CM | POA: Insufficient documentation

## 2011-01-18 DIAGNOSIS — G4730 Sleep apnea, unspecified: Secondary | ICD-10-CM | POA: Insufficient documentation

## 2011-01-18 DIAGNOSIS — M549 Dorsalgia, unspecified: Secondary | ICD-10-CM | POA: Insufficient documentation

## 2011-01-19 ENCOUNTER — Emergency Department (HOSPITAL_COMMUNITY): Payer: Medicaid Other

## 2011-04-29 LAB — DIFFERENTIAL
Basophils Absolute: 0
Basophils Relative: 1
Eosinophils Absolute: 0.2
Eosinophils Relative: 2
Lymphocytes Relative: 30
Lymphs Abs: 2.6
Monocytes Absolute: 0.4
Monocytes Relative: 4
Neutro Abs: 5.4
Neutrophils Relative %: 63

## 2011-04-29 LAB — POCT CARDIAC MARKERS
CKMB, poc: <1 — ABNORMAL LOW
Myoglobin, poc: 37.6
Troponin i, poc: <0.05

## 2011-04-29 LAB — URINALYSIS, ROUTINE W REFLEX MICROSCOPIC
Glucose, UA: >1000 — AB
Ketones, ur: 15 — AB
Leukocytes, UA: NEGATIVE
Protein, ur: 100 — AB
pH: 5.5

## 2011-04-29 LAB — PROTIME-INR
INR: 0.9
Prothrombin Time: 12.5

## 2011-04-29 LAB — COMPREHENSIVE METABOLIC PANEL
ALT: 40 — ABNORMAL HIGH
AST: 29
Albumin: 3.7
Alkaline Phosphatase: 73
Chloride: 98
GFR calc Af Amer: >60
Potassium: 4.2
Sodium: 135
Total Bilirubin: 0.6
Total Protein: 7

## 2011-04-29 LAB — URINE MICROSCOPIC-ADD ON

## 2011-04-29 LAB — URINE CULTURE

## 2011-04-29 LAB — CBC
HCT: 42.1
MCHC: 34.3
MCV: 88.1
RBC: 4.78
WBC: 8.6

## 2011-04-29 LAB — BILIRUBIN, DIRECT: Bilirubin, Direct: 0.1

## 2011-04-29 LAB — PREGNANCY, URINE: Preg Test, Ur: NEGATIVE

## 2011-06-22 ENCOUNTER — Emergency Department (HOSPITAL_COMMUNITY)
Admission: EM | Admit: 2011-06-22 | Discharge: 2011-06-22 | Disposition: A | Discharge status: Home / Self Care | Payer: Medicaid Other | Attending: Emergency Medicine | Admitting: Emergency Medicine

## 2011-06-22 DIAGNOSIS — Z9851 Tubal ligation status: Secondary | ICD-10-CM | POA: Insufficient documentation

## 2011-06-22 DIAGNOSIS — A499 Bacterial infection, unspecified: Secondary | ICD-10-CM | POA: Insufficient documentation

## 2011-06-22 DIAGNOSIS — G4733 Obstructive sleep apnea (adult) (pediatric): Secondary | ICD-10-CM | POA: Insufficient documentation

## 2011-06-22 DIAGNOSIS — G2581 Restless legs syndrome: Secondary | ICD-10-CM | POA: Insufficient documentation

## 2011-06-22 DIAGNOSIS — I10 Essential (primary) hypertension: Secondary | ICD-10-CM | POA: Insufficient documentation

## 2011-06-22 DIAGNOSIS — E119 Type 2 diabetes mellitus without complications: Secondary | ICD-10-CM | POA: Insufficient documentation

## 2011-06-22 DIAGNOSIS — N390 Urinary tract infection, site not specified: Secondary | ICD-10-CM | POA: Insufficient documentation

## 2011-06-22 DIAGNOSIS — N760 Acute vaginitis: Secondary | ICD-10-CM | POA: Insufficient documentation

## 2011-06-22 DIAGNOSIS — B9689 Other specified bacterial agents as the cause of diseases classified elsewhere: Secondary | ICD-10-CM

## 2011-06-22 DIAGNOSIS — K219 Gastro-esophageal reflux disease without esophagitis: Secondary | ICD-10-CM | POA: Insufficient documentation

## 2011-06-22 DIAGNOSIS — Z87891 Personal history of nicotine dependence: Secondary | ICD-10-CM | POA: Insufficient documentation

## 2011-06-22 HISTORY — DX: Gastro-esophageal reflux disease without esophagitis: K21.9

## 2011-06-22 HISTORY — DX: Restless legs syndrome: G25.81

## 2011-06-22 HISTORY — DX: Essential (primary) hypertension: I10

## 2011-06-22 HISTORY — DX: Obstructive sleep apnea (adult) (pediatric): G47.33

## 2011-06-22 LAB — URINALYSIS, ROUTINE W REFLEX MICROSCOPIC
Glucose, UA: 500 mg/dL — AB
Leukocytes, UA: NEGATIVE
Protein, ur: >300 mg/dL — AB
Specific Gravity, Urine: >1.03 — ABNORMAL HIGH (ref 1.005–1.030)
Urobilinogen, UA: 0.2 mg/dL (ref 0.0–1.0)

## 2011-06-22 LAB — URINE MICROSCOPIC-ADD ON

## 2011-06-22 LAB — WET PREP, GENITAL: Trich, Wet Prep: NONE SEEN

## 2011-06-22 MED ORDER — CEPHALEXIN 500 MG PO CAPS: 500.0000 mg | ORAL_CAPSULE | Freq: Four times a day (QID) | ORAL | Status: AC

## 2011-06-22 MED ORDER — CEPHALEXIN 500 MG PO CAPS
500.0000 mg | ORAL_CAPSULE | Freq: Once | ORAL | Status: AC
  Administered 2011-06-22: 500 mg via ORAL
  Filled 2011-06-22: qty 1

## 2011-06-22 MED ORDER — METRONIDAZOLE 500 MG PO TABS: 500.0000 mg | ORAL_TABLET | Freq: Two times a day (BID) | ORAL | Status: AC

## 2011-06-22 MED ORDER — FLUCONAZOLE 150 MG PO TABS: ORAL_TABLET | ORAL | Status: DC

## 2011-06-22 MED ORDER — PHENAZOPYRIDINE HCL 100 MG PO TABS
100.0000 mg | ORAL_TABLET | Freq: Once | ORAL | Status: AC
  Administered 2011-06-22: 100 mg via ORAL
  Filled 2011-06-22: qty 1

## 2011-06-22 MED ORDER — ONDANSETRON HCL 4 MG PO TABS
4.0000 mg | ORAL_TABLET | Freq: Once | ORAL | Status: AC
  Administered 2011-06-22: 4 mg via ORAL
  Filled 2011-06-22: qty 1

## 2011-06-22 MED ORDER — METRONIDAZOLE 500 MG PO TABS
500.0000 mg | ORAL_TABLET | Freq: Once | ORAL | Status: AC
Start: 2011-06-22 — End: 2011-06-22
  Administered 2011-06-22: 500 mg via ORAL
  Filled 2011-06-22: qty 1

## 2011-06-22 NOTE — ED Notes (Signed)
Pt presents with vaginal itching and vaginal burning. Pt states she has not tried any OTC medications.

## 2011-06-22 NOTE — ED Provider Notes (Signed)
History     CSN: [id number] Arrival date & time: 06/22/2011  8:19 PM   None     Chief Complaint  Patient presents with  . Vaginal Itching  . Vaginal Pain    (Consider location/radiation/quality/duration/timing/severity/associated sxs/prior treatment) HPI Comments: Pt states she has a history of uncontrolled DM.  For the past week she has been having vaginal itching and discharge. She states she was up most of the night going to the bathroom and itching or having irritation.  Patient is a 45 y.o. female presenting with vaginal itching and vaginal pain. The history is provided by the patient.  Vaginal Itching This is a recurrent problem. The current episode started in the past 7 days. The problem occurs constantly. The problem has been unchanged. Associated symptoms include fatigue, nausea and urinary symptoms. Pertinent negatives include no abdominal pain, arthralgias, chest pain, coughing, neck pain or rash. The symptoms are aggravated by nothing. She has tried nothing for the symptoms. The treatment provided no relief.  Vaginal Pain Associated symptoms include fatigue, nausea and urinary symptoms. Pertinent negatives include no abdominal pain, arthralgias, chest pain, coughing, neck pain or rash.    Past Medical History  Diagnosis Date  . Diabetes mellitus   . Hypertension   . RLS (restless legs syndrome)   . Acid reflux   . OSA (obstructive sleep apnea)     Past Surgical History  Procedure Date  . Tubal ligation     No family history on file.  History  Substance Use Topics  . Smoking status: Former Research scientist (life sciences)  . Smokeless tobacco: Not on file  . Alcohol Use: No    OB History    Grav Para Term Preterm Abortions TAB SAB Ect Mult Living                  Review of Systems  Constitutional: Positive for fatigue. Negative for activity change.       All ROS Neg except as noted in HPI  HENT: Negative for nosebleeds and neck pain.   Eyes: Negative for photophobia and  discharge.  Respiratory: Negative for cough, shortness of breath and wheezing.   Cardiovascular: Negative for chest pain and palpitations.  Gastrointestinal: Positive for nausea. Negative for abdominal pain and blood in stool.  Genitourinary: Positive for vaginal pain. Negative for dysuria, frequency and hematuria.  Musculoskeletal: Negative for back pain and arthralgias.  Skin: Negative.  Negative for rash.  Neurological: Negative for dizziness, seizures and speech difficulty.  Psychiatric/Behavioral: Negative for hallucinations and confusion.    Allergies  Codeine  Home Medications  No current outpatient prescriptions on file.  BP 106/91  Pulse 84  Temp(Src) 98.4 F (36.9 C) (Oral)  Resp 18  Ht 5\' 8"  (1.727 m)  Wt 375 lb 9.6 oz (170.371 kg)  BMI 57.11 kg/m2  SpO2 98%  Physical Exam  Nursing note and vitals reviewed. Constitutional: She is oriented to person, place, and time. She appears well-developed and well-nourished.  Non-toxic appearance.  HENT:  Head: Normocephalic.  Right Ear: Tympanic membrane and external ear normal.  Left Ear: Tympanic membrane and external ear normal.  Eyes: EOM and lids are normal. Pupils are equal, round, and reactive to light.  Neck: Normal range of motion. Neck supple. Carotid bruit is not present.  Cardiovascular: Normal rate, regular rhythm, normal heart sounds, intact distal pulses and normal pulses.   Pulmonary/Chest: Breath sounds normal. No respiratory distress.  Abdominal: Soft. Bowel sounds are normal. There is no tenderness. There is  no guarding.  Genitourinary: Vaginal discharge found.       Increase vaginal redness present. No lesions. No mass. Chaperone present during exam.  Musculoskeletal: Normal range of motion.  Lymphadenopathy:       Head (right side): No submandibular adenopathy present.       Head (left side): No submandibular adenopathy present.    She has no cervical adenopathy.  Neurological: She is alert and  oriented to person, place, and time. She has normal strength. No cranial nerve deficit or sensory deficit.  Skin: Skin is warm and dry.  Psychiatric: She has a normal mood and affect. Her speech is normal.    ED Course  Procedures (including critical care time)   Labs Reviewed  URINALYSIS, ROUTINE W REFLEX MICROSCOPIC   No results found.   Dx:1. Bacterial Vaginosis   2. UTI   MDM  I have reviewed nursing notes, vital signs, and all appropriate lab and imaging results for this patient.        Lenox Ahr, [place] 06/22/11 [phone]

## 2011-06-22 NOTE — ED Notes (Signed)
Pt set up for pelvic exam.

## 2011-06-23 NOTE — ED Provider Notes (Signed)
Medical screening examination/treatment/procedure(s) were performed by non-physician practitioner and as supervising physician I was immediately available for consultation/collaboration.  Nat Christen, MD 06/23/11 2059

## 2011-06-24 LAB — GC/CHLAMYDIA PROBE AMP, GENITAL: Chlamydia, DNA Probe: NEGATIVE

## 2011-07-17 ENCOUNTER — Encounter (HOSPITAL_COMMUNITY): Payer: Self-pay | Admitting: Emergency Medicine

## 2011-07-17 ENCOUNTER — Emergency Department (HOSPITAL_COMMUNITY)
Admission: EM | Admit: 2011-07-17 | Discharge: 2011-07-17 | Disposition: A | Discharge status: Home / Self Care | Payer: Medicaid Other | Attending: Emergency Medicine | Admitting: Emergency Medicine

## 2011-07-17 DIAGNOSIS — Z79899 Other long term (current) drug therapy: Secondary | ICD-10-CM | POA: Insufficient documentation

## 2011-07-17 DIAGNOSIS — R350 Frequency of micturition: Secondary | ICD-10-CM | POA: Insufficient documentation

## 2011-07-17 DIAGNOSIS — R300 Dysuria: Secondary | ICD-10-CM

## 2011-07-17 DIAGNOSIS — I10 Essential (primary) hypertension: Secondary | ICD-10-CM | POA: Insufficient documentation

## 2011-07-17 DIAGNOSIS — N899 Noninflammatory disorder of vagina, unspecified: Secondary | ICD-10-CM | POA: Insufficient documentation

## 2011-07-17 DIAGNOSIS — E119 Type 2 diabetes mellitus without complications: Secondary | ICD-10-CM | POA: Insufficient documentation

## 2011-07-17 DIAGNOSIS — Z794 Long term (current) use of insulin: Secondary | ICD-10-CM | POA: Insufficient documentation

## 2011-07-17 DIAGNOSIS — N3090 Cystitis, unspecified without hematuria: Secondary | ICD-10-CM | POA: Insufficient documentation

## 2011-07-17 HISTORY — DX: Bipolar disorder, unspecified: F31.9

## 2011-07-17 HISTORY — DX: Depression, unspecified: F32.A

## 2011-07-17 HISTORY — DX: Major depressive disorder, single episode, unspecified: F32.9

## 2011-07-17 LAB — URINALYSIS, ROUTINE W REFLEX MICROSCOPIC
Leukocytes, UA: NEGATIVE
Nitrite: NEGATIVE
Protein, ur: >300 mg/dL — AB
Urobilinogen, UA: 0.2 mg/dL (ref 0.0–1.0)

## 2011-07-17 LAB — URINE MICROSCOPIC-ADD ON

## 2011-07-17 LAB — POCT PREGNANCY, URINE: Preg Test, Ur: NEGATIVE

## 2011-07-17 LAB — WET PREP, GENITAL
Clue Cells Wet Prep HPF POC: NONE SEEN
Trich, Wet Prep: NONE SEEN

## 2011-07-17 MED ORDER — CEPHALEXIN 500 MG PO CAPS: 500.0000 mg | ORAL_CAPSULE | Freq: Four times a day (QID) | ORAL | Status: AC

## 2011-07-17 NOTE — ED Provider Notes (Signed)
History   This chart was scribed for Alfonzo Feller, DO by Carolyne Littles. The patient was seen in room APA08/APA08 and the patient's care was started at 7:55AM.   CSN: [id number]  Arrival date & time 07/17/11  [id number]   Chief Complaint  Patient presents with  . Vaginal Itching    HPI Pt was seen at 0755. Martha [name] is a 45 y.o. female seen at 7:55AM who presents to the Emergency Department complaining of gradual onset and persistence of constant dysuria, urinary frequency and vaginal itching x2 days.  Patient reports h/o of similar symptoms which were associated with an episode of bacterial vaginosis.  Denies vaginal discharge/bleeding, no abd pain, no flank pain, no fevers, no N/V/D, no rash.   PCP:  Dr Luan Pulling Past Medical History  Diagnosis Date  . Diabetes mellitus   . Hypertension   . RLS (restless legs syndrome)   . Acid reflux   . OSA (obstructive sleep apnea)   . Bipolar 1 disorder   . Depression     Past Surgical History  Procedure Date  . Tubal ligation   . Endometrial ablation     Family History  Problem Relation Age of Onset  . Adopted: Yes    History  Substance Use Topics  . Smoking status: Former Smoker -- 1.5 packs/day for 25 years    Types: Cigarettes    Quit date: 10/23/2007  . Smokeless tobacco: Never Used  . Alcohol Use: No    OB History    Grav Para Term Preterm Abortions TAB SAB Ect Mult Living   2 2 1 1      2       Review of Systems ROS: Statement: All systems negative except as marked or noted in the HPI; Constitutional: Negative for fever and chills. ; ; Eyes: Negative for eye pain, redness and discharge. ; ; ENMT: Negative for ear pain, hoarseness, nasal congestion, sinus pressure and sore throat. ; ; Cardiovascular: Negative for chest pain, palpitations, diaphoresis, dyspnea and peripheral edema. ; ; Respiratory: Negative for cough, wheezing and stridor. ; ; Gastrointestinal: Negative for nausea, vomiting, diarrhea, abdominal pain,  blood in stool, hematemesis, jaundice and rectal bleeding. . ; ; Genitourinary: +dysuria, urinary frequency.  Negative for flank pain and hematuria.; GYN:  No vaginal bleeding, no vaginal discharge, no vulvar pain, +vaginal itching.; ; Musculoskeletal: Negative for back pain and neck pain. Negative for swelling and trauma.; ; Skin: Negative for pruritus, rash, abrasions, blisters, bruising and skin lesion.; ; Neuro: Negative for headache, lightheadedness and neck stiffness. Negative for weakness, altered level of consciousness , altered mental status, extremity weakness, paresthesias, involuntary movement, seizure and syncope.     Allergies  Codeine  Home Medications   Current Outpatient Rx  Name Route Sig Dispense Refill  . FLUCONAZOLE 150 MG PO TABS  1 po today for vaginal itching, may repeat in 1 week if needed. 2 tablet 0  . INSULIN DETEMIR 100 UNIT/ML [place] SOLN Subcutaneous Inject into the skin at bedtime.      Marland Kitchen LISINOPRIL-HYDROCHLOROTHIAZIDE 20-12.5 MG PO TABS Oral Take 1 tablet by mouth daily.      Marland Kitchen OMEPRAZOLE 20 MG PO CPDR Oral Take 20 mg by mouth daily.      Marland Kitchen ROPINIROLE HCL 1 MG PO TABS Oral Take 1 mg by mouth 3 (three) times daily.      Marland Kitchen SIMVASTATIN 20 MG PO TABS Oral Take 20 mg by mouth at bedtime.  BP 152/99  Pulse 85  Temp(Src) 97.7 F (36.5 C) (Oral)  Resp 19  Ht 5\' 7"  (1.702 m)  Wt 355 lb (161.027 kg)  BMI 55.60 kg/m2  SpO2 98%  Physical Exam 0800: Physical examination:  Nursing notes reviewed; Vital signs and O2 SAT reviewed;  Constitutional: Well developed, Well nourished, Well hydrated, In no acute distress; Head:  Normocephalic, atraumatic; Eyes: EOMI, PERRL, No scleral icterus; ENMT: Mouth and pharynx normal, Mucous membranes moist; Neck: Supple, Full range of motion, No lymphadenopathy; Cardiovascular: Regular rate and rhythm, No murmur, rub, or gallop; Respiratory: Breath sounds clear & equal bilaterally, No rales, rhonchi, wheezes, or rub, Normal  respiratory effort/excursion; Chest: Nontender, Movement normal; Abdomen: Soft, Nontender, Nondistended, Normal bowel sounds; Genitourinary: No CVA tenderness; Pelvic exam performed with permission of pt and female ED tech assist during exam.  External genitalia w/o lesions. Vaginal vault with small amount of thick white discharge.  Cervix w/o lesions, not friable, GC/chlam and wet prep obtained and sent to lab.  Bimanual exam w/o CMT, uterine or adenexal tenderness.   Extremities: Pulses normal, No tenderness, No edema, No calf edema or asymmetry.; Neuro: AA&Ox3, Major CN grossly intact.  No gross focal motor or sensory deficits in extremities.; Skin: Color normal, Warm, Dry, no rash.    ED Course  Procedures   MDM  MDM Reviewed: previous chart, nursing note and vitals Interpretation: labs   Results for orders placed during the hospital encounter of 07/17/11  WET PREP, GENITAL      Component Value Range   Yeast, Wet Prep NONE SEEN  NONE SEEN    Trich, Wet Prep NONE SEEN  NONE SEEN    Clue Cells, Wet Prep NONE SEEN  NONE SEEN    WBC, Wet Prep HPF POC FEW (*) NONE SEEN   URINALYSIS, ROUTINE W REFLEX MICROSCOPIC      Component Value Range   Color, Urine YELLOW  YELLOW    APPearance CLEAR  CLEAR    Specific Gravity, Urine 1.025  1.005 - 1.030    pH 6.0  5.0 - 8.0    Glucose, UA >1000 (*) NEGATIVE (mg/dL)   Hgb urine dipstick SMALL (*) NEGATIVE    Bilirubin Urine NEGATIVE  NEGATIVE    Ketones, ur NEGATIVE  NEGATIVE (mg/dL)   Protein, ur >300 (*) NEGATIVE (mg/dL)   Urobilinogen, UA 0.2  0.0 - 1.0 (mg/dL)   Nitrite NEGATIVE  NEGATIVE    Leukocytes, UA NEGATIVE  NEGATIVE   PREGNANCY, URINE      Component Value Range   Preg Test, Ur NEGATIVE    URINE MICROSCOPIC-ADD ON      Component Value Range   Squamous Epithelial / LPF MANY (*) RARE    WBC, UA 3-6  <3 (WBC/hpf)   RBC / HPF 0-2  <3 (RBC/hpf)   Bacteria, UA FEW (*) RARE       9:46 AM:  Pt continues to c/o urinary freq and  dysuria while here in the ED.  Describes her symptoms as "just like when I get a UTI."  Possibly low level cystitis, will tx with keflex.  UC pending.  Dx testing d/w pt.  Questions answered.  Verb understanding, agreeable to d/c home with outpt f/u.       I personally performed the services described in this documentation, which was scribed in my presence. The recorded information has been reviewed and considered.  [place], DO 07/18/11 1117

## 2011-07-17 NOTE — ED Notes (Signed)
Patient c/o burning and itching in vaginal area x1 day. Patient also reports urinary frequency. Denies any discharge.

## 2011-07-18 LAB — URINE CULTURE: Culture  Setup Time: 201212201120

## 2011-09-11 ENCOUNTER — Other Ambulatory Visit (HOSPITAL_COMMUNITY): Payer: Self-pay | Admitting: Pulmonary Disease

## 2011-09-11 DIAGNOSIS — Z139 Encounter for screening, unspecified: Secondary | ICD-10-CM

## 2011-09-18 ENCOUNTER — Ambulatory Visit (HOSPITAL_COMMUNITY)
Admission: RE | Admit: 2011-09-18 | Discharge: 2011-09-18 | Disposition: A | Discharge status: Home / Self Care | Payer: Medicaid Other | Source: Ambulatory Visit | Attending: Pulmonary Disease | Admitting: Pulmonary Disease

## 2011-09-18 DIAGNOSIS — Z139 Encounter for screening, unspecified: Secondary | ICD-10-CM

## 2011-09-18 DIAGNOSIS — Z1231 Encounter for screening mammogram for malignant neoplasm of breast: Secondary | ICD-10-CM | POA: Insufficient documentation

## 2011-11-19 ENCOUNTER — Telehealth: Payer: Self-pay | Admitting: Gastroenterology

## 2011-11-19 NOTE — Telephone Encounter (Signed)
Pt called this afternoon to let us know she has been waiting 6 months to hear from [facility] that we referred her to. She was following up on this. Please call her at [phone]

## 2011-11-20 NOTE — Telephone Encounter (Signed)
Pt was scheduled back in 2011 but had to cancel her appt- She now wants to be referred back to [facility]- I have faxed over a new referral and all notes to [place]- they will contact her to schedule - pt is aware and ok with this plan

## 2011-11-26 HISTORY — PX: COLONOSCOPY: SHX174

## 2012-01-08 ENCOUNTER — Encounter (HOSPITAL_COMMUNITY): Payer: Self-pay

## 2012-01-08 ENCOUNTER — Emergency Department (HOSPITAL_COMMUNITY)
Admission: EM | Admit: 2012-01-08 | Discharge: 2012-01-08 | Disposition: A | Discharge status: Home / Self Care | Payer: Medicaid Other | Attending: Emergency Medicine | Admitting: Emergency Medicine

## 2012-01-08 ENCOUNTER — Emergency Department (HOSPITAL_COMMUNITY): Payer: Medicaid Other

## 2012-01-08 DIAGNOSIS — F319 Bipolar disorder, unspecified: Secondary | ICD-10-CM | POA: Insufficient documentation

## 2012-01-08 DIAGNOSIS — M79605 Pain in left leg: Secondary | ICD-10-CM

## 2012-01-08 DIAGNOSIS — M79609 Pain in unspecified limb: Secondary | ICD-10-CM | POA: Insufficient documentation

## 2012-01-08 DIAGNOSIS — Z87891 Personal history of nicotine dependence: Secondary | ICD-10-CM | POA: Insufficient documentation

## 2012-01-08 DIAGNOSIS — I10 Essential (primary) hypertension: Secondary | ICD-10-CM | POA: Insufficient documentation

## 2012-01-08 DIAGNOSIS — G2581 Restless legs syndrome: Secondary | ICD-10-CM | POA: Insufficient documentation

## 2012-01-08 DIAGNOSIS — K219 Gastro-esophageal reflux disease without esophagitis: Secondary | ICD-10-CM | POA: Insufficient documentation

## 2012-01-08 DIAGNOSIS — G4733 Obstructive sleep apnea (adult) (pediatric): Secondary | ICD-10-CM | POA: Insufficient documentation

## 2012-01-08 DIAGNOSIS — E119 Type 2 diabetes mellitus without complications: Secondary | ICD-10-CM | POA: Insufficient documentation

## 2012-01-08 LAB — BASIC METABOLIC PANEL
Calcium: 9.4 mg/dL (ref 8.4–10.5)
GFR calc non Af Amer: 83 mL/min — ABNORMAL LOW (ref >90)
Sodium: 133 mEq/L — ABNORMAL LOW (ref 135–145)

## 2012-01-08 LAB — GLUCOSE, CAPILLARY
Glucose-Capillary: 488 mg/dL — ABNORMAL HIGH (ref 70–99)
Glucose-Capillary: 412 mg/dL — ABNORMAL HIGH (ref 70–99)

## 2012-01-08 MED ORDER — OXYCODONE-ACETAMINOPHEN 10-650 MG PO TABS: 1.0000 | ORAL_TABLET | Freq: Four times a day (QID) | ORAL | Status: AC | PRN

## 2012-01-08 MED ORDER — KETOROLAC TROMETHAMINE 60 MG/2ML IM SOLN
60.0000 mg | Freq: Once | INTRAMUSCULAR | Status: AC
  Administered 2012-01-08: 60 mg via INTRAMUSCULAR
  Filled 2012-01-08: qty 2

## 2012-01-08 MED ORDER — SODIUM CHLORIDE 0.9 % IV SOLN
Freq: Once | INTRAVENOUS | Status: AC
  Administered 2012-01-08: 16:00:00 via INTRAVENOUS

## 2012-01-08 MED ORDER — SODIUM CHLORIDE 0.9 % IV SOLN
Freq: Once | INTRAVENOUS | Status: AC
  Administered 2012-01-08: 14:00:00 via INTRAVENOUS

## 2012-01-08 NOTE — ED Notes (Signed)
Pt waiting to be seen by MD. Pt alert and oriented x 3. Skin warm and dry. Color pink.

## 2012-01-08 NOTE — Discharge Instructions (Signed)
Xray was normal.  Pain meds.  Most likely pulled muscle.  See your dr

## 2012-01-08 NOTE — ED Provider Notes (Signed)
History   This chart was scribed for Nat Christen, MD by Carolyne Littles. The patient was seen in room APA06/APA06. Patient's care was started at 1148.    CSN: [id number]  Arrival date & time 01/08/12  1148   First MD Initiated Contact with Patient 01/08/12 1339      Chief Complaint  Patient presents with  . Groin Pain    (Consider location/radiation/quality/duration/timing/severity/associated sxs/prior treatment) HPI Martha [name] is a 46 y.o. female who presents to the Emergency Department complaining of waxing and waning moderate pain to groin localized specifically to left medial proximal thigh which intermittently radiates to region of left proximal humerus described as shooting onset 3 days ago and persistent since. Patient notes pain is aggravated with certain positions. Denie SOB, chest pain, nausea, vomiting, decreased appetite. Patient with h/o diabetes, HTN, RLS, tubal ligation, endometrial ablation  Past Medical History  Diagnosis Date  . Diabetes mellitus   . Hypertension   . RLS (restless legs syndrome)   . Acid reflux   . OSA (obstructive sleep apnea)   . Bipolar 1 disorder   . Depression     Past Surgical History  Procedure Date  . Tubal ligation   . Endometrial ablation     Family History  Problem Relation Age of Onset  . Adopted: Yes    History  Substance Use Topics  . Smoking status: Former Smoker -- 1.5 packs/day for 25 years    Types: Cigarettes    Quit date: 10/23/2007  . Smokeless tobacco: Never Used  . Alcohol Use: No    OB History    Grav Para Term Preterm Abortions TAB SAB Ect Mult Living   2 2 1 1      2       Review of Systems  Allergies  Codeine  Home Medications   Current Outpatient Rx  Name Route Sig Dispense Refill  . ALPRAZOLAM 1 MG PO TABS Oral Take 1 mg by mouth 4 (four) times daily.      . INSULIN DETEMIR 100 UNIT/ML [place] SOLN Subcutaneous Inject 80 Units into the skin 2 (two) times daily.     Marland Kitchen  LISINOPRIL-HYDROCHLOROTHIAZIDE 20-12.5 MG PO TABS Oral Take 1 tablet by mouth 2 (two) times daily.     Marland Kitchen OMEPRAZOLE 20 MG PO CPDR Oral Take 20 mg by mouth daily.      . OXYCODONE-ACETAMINOPHEN 10-325 MG PO TABS Oral Take 1 tablet by mouth 4 (four) times daily.      Marland Kitchen ROPINIROLE HCL 1 MG PO TABS Oral Take 1 mg by mouth at bedtime.     Marland Kitchen SIMVASTATIN 20 MG PO TABS Oral Take 20 mg by mouth at bedtime.        BP 146/93  Pulse 104  Temp 98.3 F (36.8 C) (Oral)  Resp 16  Ht 5\' 11"  (1.803 m)  Wt 375 lb (170.099 kg)  BMI 52.30 kg/m2  SpO2 95%  Physical Exam  Nursing note and vitals reviewed. Constitutional: She is oriented to person, place, and time. She appears well-developed and well-nourished. No distress.       Morbidly obese.  HENT:  Head: Normocephalic and atraumatic.  Eyes: EOM are normal. Pupils are equal, round, and reactive to light.  Neck: Neck supple. No tracheal deviation present.  Cardiovascular: Normal rate and regular rhythm.   No murmur heard. Pulmonary/Chest: Effort normal. No respiratory distress. She has no wheezes.  Abdominal: Soft. She exhibits no distension.  Musculoskeletal: Normal range of motion. She  exhibits tenderness. She exhibits no edema.       minimal tenderness to left medial proximal thigh  Neurological: She is alert and oriented to person, place, and time. No sensory deficit.  Skin: Skin is warm and dry.  Psychiatric: She has a normal mood and affect. Her behavior is normal.    ED Course  Procedures (including critical care time)  DIAGNOSTIC STUDIES: Oxygen Saturation is 95% on room air, adequate by my interpretation.    COORDINATION OF CARE: 2:13PM-Will administer pain medication and obtain x-ray of left femur.    Labs Reviewed  GLUCOSE, CAPILLARY - Abnormal; Notable for the following:    Glucose-Capillary 488 (*)     All other components within normal limits  BASIC METABOLIC PANEL - Abnormal; Notable for the following:    Sodium 133 (*)      Chloride 95 (*)     Glucose, Bld 498 (*)     GFR calc non Af Amer 83 (*)     All other components within normal limits   Dg Femur Left  01/08/2012  *RADIOLOGY REPORT*  Clinical Data: Lower extremity pain.  LEFT FEMUR - 2 VIEW  Comparison: None.  Findings: No acute bony or joint abnormality is identified.  There appears to be some degenerative disease about the right hip although visualization is limited due to the patient's body habitus.  Enthesopathic change quadriceps tendon insertion on the patella noted.  IMPRESSION: No acute finding.  Right hip degenerative disease.  Original Report Authenticated By: Arvid Right. Luther Parody, M.D.     No diagnosis found.    MDM  No clinical evidence of dvt.  Left femur xray neg.  D/c home c percocet 10/650  #15      I personally performed the services described in this documentation, which was scribed in my presence. The recorded information has been reviewed and considered.    Nat Christen, MD 01/08/12 1626

## 2012-01-08 NOTE — ED Notes (Signed)
Pt c/o left groin pain x 3 days.

## 2012-01-08 NOTE — ED Notes (Signed)
Pt c/o pain in her left groin especially with movement and walking. Able to move all extremities. No acute distress. Alert and oriented x 3. Skin warm and dry. Color pink.

## 2012-01-22 ENCOUNTER — Encounter (HOSPITAL_COMMUNITY): Payer: Self-pay | Admitting: *Deleted

## 2012-01-22 ENCOUNTER — Emergency Department (HOSPITAL_COMMUNITY)
Admission: EM | Admit: 2012-01-22 | Discharge: 2012-01-22 | Disposition: A | Discharge status: Home / Self Care | Payer: Medicaid Other | Attending: Emergency Medicine | Admitting: Emergency Medicine

## 2012-01-22 ENCOUNTER — Emergency Department (HOSPITAL_COMMUNITY): Payer: Medicaid Other

## 2012-01-22 DIAGNOSIS — R059 Cough, unspecified: Secondary | ICD-10-CM | POA: Insufficient documentation

## 2012-01-22 DIAGNOSIS — I10 Essential (primary) hypertension: Secondary | ICD-10-CM | POA: Insufficient documentation

## 2012-01-22 DIAGNOSIS — Z87891 Personal history of nicotine dependence: Secondary | ICD-10-CM | POA: Insufficient documentation

## 2012-01-22 DIAGNOSIS — K219 Gastro-esophageal reflux disease without esophagitis: Secondary | ICD-10-CM | POA: Insufficient documentation

## 2012-01-22 DIAGNOSIS — F319 Bipolar disorder, unspecified: Secondary | ICD-10-CM | POA: Insufficient documentation

## 2012-01-22 DIAGNOSIS — Z794 Long term (current) use of insulin: Secondary | ICD-10-CM | POA: Insufficient documentation

## 2012-01-22 DIAGNOSIS — G2581 Restless legs syndrome: Secondary | ICD-10-CM | POA: Insufficient documentation

## 2012-01-22 DIAGNOSIS — E119 Type 2 diabetes mellitus without complications: Secondary | ICD-10-CM | POA: Insufficient documentation

## 2012-01-22 DIAGNOSIS — Z79899 Other long term (current) drug therapy: Secondary | ICD-10-CM | POA: Insufficient documentation

## 2012-01-22 DIAGNOSIS — F4310 Post-traumatic stress disorder, unspecified: Secondary | ICD-10-CM | POA: Insufficient documentation

## 2012-01-22 DIAGNOSIS — R05 Cough: Secondary | ICD-10-CM | POA: Insufficient documentation

## 2012-01-22 MED ORDER — IPRATROPIUM BROMIDE 0.02 % IN SOLN
0.5000 mg | Freq: Once | RESPIRATORY_TRACT | Status: AC
  Administered 2012-01-22: 0.5 mg via RESPIRATORY_TRACT
  Filled 2012-01-22: qty 2.5

## 2012-01-22 MED ORDER — AZITHROMYCIN 250 MG PO TABS: ORAL_TABLET | ORAL | Status: DC

## 2012-01-22 MED ORDER — ALBUTEROL SULFATE (5 MG/ML) 0.5% IN NEBU
2.5000 mg | INHALATION_SOLUTION | Freq: Once | RESPIRATORY_TRACT | Status: AC
  Administered 2012-01-22: 2.5 mg via RESPIRATORY_TRACT
  Filled 2012-01-22: qty 0.5

## 2012-01-22 MED ORDER — ALBUTEROL SULFATE HFA 108 (90 BASE) MCG/ACT IN AERS
INHALATION_SPRAY | RESPIRATORY_TRACT | Status: AC
  Filled 2012-01-22: qty 6.7

## 2012-01-22 MED ORDER — ALBUTEROL SULFATE HFA 108 (90 BASE) MCG/ACT IN AERS: 2.0000 | INHALATION_SPRAY | RESPIRATORY_TRACT | Status: DC | PRN

## 2012-01-22 MED ORDER — BENZONATATE 100 MG PO CAPS: 200.0000 mg | ORAL_CAPSULE | Freq: Three times a day (TID) | ORAL | Status: DC | PRN

## 2012-01-22 MED ORDER — ALBUTEROL SULFATE HFA 108 (90 BASE) MCG/ACT IN AERS: 1.0000 | INHALATION_SPRAY | RESPIRATORY_TRACT | Status: DC | PRN

## 2012-01-22 MED ORDER — BENZONATATE 200 MG PO CAPS: 200.0000 mg | ORAL_CAPSULE | Freq: Three times a day (TID) | ORAL | Status: AC

## 2012-01-22 NOTE — ED Provider Notes (Signed)
History     CSN: [id number]  Arrival date & time 01/22/12  1006   First MD Initiated Contact with Patient 01/22/12 1025      Chief Complaint  Patient presents with  . Cough    (Consider location/radiation/quality/duration/timing/severity/associated sxs/prior treatment) Patient is a 46 y.o. female presenting with cough. The history is provided by the patient.  Cough This is a new problem. The current episode started 2 days ago. The problem occurs every few minutes. The problem has not changed since onset.The cough is productive of sputum. There has been no fever. Associated symptoms include chills, headaches, rhinorrhea, sore throat and myalgias. Pertinent negatives include no chest pain, no ear congestion, no ear pain, no shortness of breath, no wheezing and no eye redness. She has tried nothing for the symptoms. The treatment provided no relief. She is not a smoker. Her past medical history does not include pneumonia or asthma.    Past Medical History  Diagnosis Date  . Diabetes mellitus   . Hypertension   . RLS (restless legs syndrome)   . Acid reflux   . OSA (obstructive sleep apnea)   . Bipolar 1 disorder   . Depression     Past Surgical History  Procedure Date  . Tubal ligation   . Endometrial ablation     Family History  Problem Relation Age of Onset  . Adopted: Yes    History  Substance Use Topics  . Smoking status: Former Smoker -- 1.5 packs/day for 25 years    Types: Cigarettes    Quit date: 10/23/2007  . Smokeless tobacco: Never Used  . Alcohol Use: No    OB History    Grav Para Term Preterm Abortions TAB SAB Ect Mult Living   2 2 1 1      2       Review of Systems  Constitutional: Positive for chills. Negative for fever, activity change and appetite change.  HENT: Positive for congestion, sore throat and rhinorrhea. Negative for ear pain, facial swelling, trouble swallowing, neck pain and neck stiffness.   Eyes: Negative for redness and visual  disturbance.  Respiratory: Positive for cough. Negative for chest tightness, shortness of breath, wheezing and stridor.   Cardiovascular: Negative for chest pain.  Gastrointestinal: Negative for nausea, vomiting and abdominal pain.  Musculoskeletal: Positive for myalgias.  Skin: Negative.   Neurological: Positive for headaches. Negative for dizziness, weakness and numbness.  Hematological: Negative for adenopathy.  Psychiatric/Behavioral: Negative for confusion.  All other systems reviewed and are negative.    Allergies  Codeine  Home Medications   Current Outpatient Rx  Name Route Sig Dispense Refill  . ALPRAZOLAM 1 MG PO TABS Oral Take 1 mg by mouth 4 (four) times daily.      . INSULIN DETEMIR 100 UNIT/ML [place] SOLN Subcutaneous Inject 80 Units into the skin 2 (two) times daily.     Marland Kitchen LISINOPRIL-HYDROCHLOROTHIAZIDE 20-12.5 MG PO TABS Oral Take 1 tablet by mouth 2 (two) times daily.     Marland Kitchen OMEPRAZOLE 20 MG PO CPDR Oral Take 20 mg by mouth daily.      . OXYCODONE-ACETAMINOPHEN 10-325 MG PO TABS Oral Take 1 tablet by mouth 4 (four) times daily.      Marland Kitchen ROPINIROLE HCL 1 MG PO TABS Oral Take 1 mg by mouth at bedtime.     Marland Kitchen SIMVASTATIN 20 MG PO TABS Oral Take 20 mg by mouth at bedtime.        BP 153/110  Pulse  95  Temp 97.9 F (36.6 C) (Oral)  Resp 20  Ht 5\' 8"  (1.727 m)  Wt 355 lb (161.027 kg)  BMI 53.98 kg/m2  SpO2 94%  Physical Exam  Nursing note and vitals reviewed. Constitutional: She is oriented to person, place, and time. She appears well-developed and well-nourished. No distress.  HENT:  Head: Normocephalic and atraumatic. No trismus in the jaw.  Right Ear: Tympanic membrane and ear canal normal.  Left Ear: Tympanic membrane and ear canal normal.  Nose: Mucosal edema and rhinorrhea present.  Mouth/Throat: Uvula is midline and mucous membranes are normal. No uvula swelling. Posterior oropharyngeal erythema present. No oropharyngeal exudate, posterior oropharyngeal edema  or tonsillar abscesses.  Neck: Normal range of motion and phonation normal. Neck supple. No Brudzinski's sign and no Kernig's sign noted.  Cardiovascular: Normal rate, regular rhythm, normal heart sounds and intact distal pulses.   No murmur heard. Pulmonary/Chest: Effort normal. No respiratory distress. She has no wheezes. She has no rales.       Coarse lung sounds bilaterally  Abdominal: Soft. Bowel sounds are normal. She exhibits no distension. There is no tenderness.  Musculoskeletal: She exhibits no edema.  Lymphadenopathy:    She has no cervical adenopathy.  Neurological: She is alert and oriented to person, place, and time. She exhibits normal muscle tone. Coordination normal.  Skin: Skin is warm and dry.    ED Course  Procedures (including critical care time)  Labs Reviewed - No data to display Dg Chest 2 View  01/22/2012  *RADIOLOGY REPORT*  Clinical Data: Cough and shortness of breath.  CHEST - 2 VIEW  Comparison: Chest x-ray 04/29/2008.  Findings: Lung volumes are normal.  No consolidative airspace disease.  No pleural effusions.  No pneumothorax.  No pulmonary nodule or mass noted.  Pulmonary vasculature and the cardiomediastinal silhouette are within normal limits.  IMPRESSION: 1. No radiographic evidence of acute cardiopulmonary disease.  Original Report Authenticated By: Etheleen Mayhew, M.D.        MDM    Patient is feeling better.  Lung sounds improved.  Nasal congestion present.  Agrees to close follow-up with her PMD.  No dyspnea, tachycardia or tachypnea to suggest PE  The patient appears reasonably screened and/or stabilized for discharge and I doubt any other medical condition or other [facility] requiring further screening, evaluation, or treatment in the ED at this time prior to discharge.   Dispensed albuterol inhaler from ED  Prescribed:  zithromax Tessalon perles    Zacarias Krauter L. [place], [place] 01/26/12 2355

## 2012-01-22 NOTE — ED Notes (Signed)
Pt c/o cough, congestion, and headache x 2 days. Pt states that she is unable to cough anything up.

## 2012-01-22 NOTE — ED Notes (Signed)
Pt stated she has been sick for several days with cough and congestion.  Also has headache now.

## 2012-01-28 NOTE — ED Provider Notes (Signed)
Medical screening examination/treatment/procedure(s) were performed by non-physician practitioner and as supervising physician I was immediately available for consultation/collaboration. Rolland Porter, MD, FACEP   [name], MD 01/28/12 [phone]

## 2012-02-08 ENCOUNTER — Encounter (HOSPITAL_COMMUNITY): Payer: Self-pay | Admitting: *Deleted

## 2012-02-08 ENCOUNTER — Emergency Department (HOSPITAL_COMMUNITY)
Admission: EM | Admit: 2012-02-08 | Discharge: 2012-02-09 | Disposition: A | Discharge status: Home / Self Care | Payer: Medicaid Other | Attending: Emergency Medicine | Admitting: Emergency Medicine

## 2012-02-08 DIAGNOSIS — Z87891 Personal history of nicotine dependence: Secondary | ICD-10-CM | POA: Insufficient documentation

## 2012-02-08 DIAGNOSIS — I10 Essential (primary) hypertension: Secondary | ICD-10-CM | POA: Insufficient documentation

## 2012-02-08 DIAGNOSIS — K219 Gastro-esophageal reflux disease without esophagitis: Secondary | ICD-10-CM | POA: Insufficient documentation

## 2012-02-08 DIAGNOSIS — K644 Residual hemorrhoidal skin tags: Secondary | ICD-10-CM | POA: Insufficient documentation

## 2012-02-08 DIAGNOSIS — E119 Type 2 diabetes mellitus without complications: Secondary | ICD-10-CM | POA: Insufficient documentation

## 2012-02-08 DIAGNOSIS — A630 Anogenital (venereal) warts: Secondary | ICD-10-CM | POA: Insufficient documentation

## 2012-02-08 DIAGNOSIS — Z79899 Other long term (current) drug therapy: Secondary | ICD-10-CM | POA: Insufficient documentation

## 2012-02-08 DIAGNOSIS — F319 Bipolar disorder, unspecified: Secondary | ICD-10-CM | POA: Insufficient documentation

## 2012-02-08 DIAGNOSIS — Z794 Long term (current) use of insulin: Secondary | ICD-10-CM | POA: Insufficient documentation

## 2012-02-08 DIAGNOSIS — K649 Unspecified hemorrhoids: Secondary | ICD-10-CM

## 2012-02-08 NOTE — ED Notes (Signed)
Pt reports rectal pain that started 45 min to hour ago. Pt states she has had 3 to 4 bowel movements today & now feels like she needs to but can't

## 2012-02-08 NOTE — ED Notes (Addendum)
Pt reports rectal pain described as a cramping. Pt w/ hx of hemmoriods & p

## 2012-02-08 NOTE — ED Provider Notes (Signed)
History   This chart was scribed for Martha Lower, MD by Marin Comment . The patient was seen in room APA07/APA07.    CSN: [id number]  Arrival date & time 02/08/12  2257   First MD Initiated Contact with Patient 02/08/12 2258      Chief Complaint  Patient presents with  . rectal pain     (Consider location/radiation/quality/duration/timing/severity/associated sxs/prior treatment) HPI Martha [name] is a 46 y.o. female who presents to the Emergency Department complaining of constant, moderate rectal pain that started 45 minutes PTA. Pt describes the rectal pain as cramping Pt states that she has had 3-4 BMs today, and now feels constipated. Patient denies any fevers or vomiting. Pt has a h/o hemorrhoids and diabetes in which she takes insulin for.no ABD pain o/w. intermittent rectal bleeding BRB that she attributed to hemorrhoids.    Past Medical History  Diagnosis Date  . Diabetes mellitus   . Hypertension   . RLS (restless legs syndrome)   . Acid reflux   . OSA (obstructive sleep apnea)   . Bipolar 1 disorder   . Depression     Past Surgical History  Procedure Date  . Tubal ligation   . Endometrial ablation     Family History  Problem Relation Age of Onset  . Adopted: Yes    History  Substance Use Topics  . Smoking status: Former Smoker -- 1.5 packs/day for 25 years    Types: Cigarettes    Quit date: 10/23/2007  . Smokeless tobacco: Never Used  . Alcohol Use: No    OB History    Grav Para Term Preterm Abortions TAB SAB Ect Mult Living   2 2 1 1      2       Review of Systems  Constitutional: Negative for fever and chills.  HENT: Negative for neck pain and neck stiffness.   Eyes: Negative for pain.  Respiratory: Negative for shortness of breath.   Cardiovascular: Negative for chest pain.  Gastrointestinal: Negative for vomiting, abdominal pain and abdominal distention.  Genitourinary: Negative for dysuria, vaginal bleeding and vaginal pain.    Musculoskeletal: Negative for back pain.  Skin: Negative for rash.  Neurological: Negative for headaches.  All other systems reviewed and are negative.   A complete 10 system review of systems was obtained and all systems are negative except as noted in the HPI and PMH.   Allergies  Codeine  Home Medications   Current Outpatient Rx  Name Route Sig Dispense Refill  . ALPRAZOLAM 1 MG PO TABS Oral Take 1 mg by mouth 4 (four) times daily.      . AZITHROMYCIN 250 MG PO TABS  Take two tablets on day one, then one tab qd days 2-5 6 tablet 0  . INSULIN DETEMIR 100 UNIT/ML [place] SOLN Subcutaneous Inject 80 Units into the skin 2 (two) times daily.     Marland Kitchen LISINOPRIL-HYDROCHLOROTHIAZIDE 20-12.5 MG PO TABS Oral Take 1 tablet by mouth 2 (two) times daily.     Marland Kitchen OMEPRAZOLE 20 MG PO CPDR Oral Take 20 mg by mouth daily.      . OXYCODONE-ACETAMINOPHEN 10-325 MG PO TABS Oral Take 1 tablet by mouth 4 (four) times daily.      Marland Kitchen ROPINIROLE HCL 1 MG PO TABS Oral Take 1 mg by mouth at bedtime.     Marland Kitchen SIMVASTATIN 20 MG PO TABS Oral Take 20 mg by mouth at bedtime.        BP 148/86  Pulse 75  Temp 97.5 F (36.4 C) (Oral)  Resp 20  Ht 5\' 8"  (1.727 m)  Wt 376 lb 8 oz (170.779 kg)  BMI 57.25 kg/m2  SpO2 96%  Physical Exam  Nursing note and vitals reviewed. Constitutional: She is oriented to person, place, and time. She appears well-developed and well-nourished. No distress.  HENT:  Head: Normocephalic and atraumatic.  Mouth/Throat: Oropharynx is clear and moist.  Eyes: EOM are normal. Pupils are equal, round, and reactive to light.  Neck: Normal range of motion. Neck supple. No tracheal deviation present.  Cardiovascular: Normal rate, regular rhythm and normal heart sounds.   Pulmonary/Chest: Effort normal and breath sounds normal. No respiratory distress. She has no wheezes.  Abdominal: Soft. She exhibits no distension. There is no tenderness.  Genitourinary: Rectal exam shows external hemorrhoid.        External hemorrhoids and anal warts. No thrombus hemmorrhoids no active bleeding. No masses. Brown stool.   Musculoskeletal: Normal range of motion. She exhibits no edema.  Neurological: She is alert and oriented to person, place, and time. No sensory deficit.  Skin: Skin is warm and dry. No rash noted.  Psychiatric: She has a normal mood and affect. Her behavior is normal.    ED Course  Procedures (including critical care time)  DIAGNOSTIC STUDIES: Oxygen Saturation is 96% on room air, adequate by my interpretation.    COORDINATION OF CARE:  2322: Discussed planned course of treatment with the patient, who is agreeable at this time.  2325: Preformed rectal exam. Will refer to general surgeon and prescribe lidocaine jelly. Discussed warm baths with epsom salt.    PROCEDURE 11:57 PM ANOSCOPY Consent obtained, PT prepped and draped in appropriate fashion. Time out performed and anoscopy performed. No bleeding internal hemorrhoids. Does have external hemorrhoids and warts without thrombosis or active bleeding, brown stool. PT tolerated procedure well.    MDM  Rectal pain due to external hemorrhoids and warts. Plan epson salt warm soaks, lido jelly and Gen Surgery referral. Colace prescribed and PT aware of avoiding constipation.    I personally performed the services described in this documentation, which was scribed in my presence. The recorded information has been reviewed and considered.          Martha Lower, MD 02/09/12 [phone]

## 2012-02-09 MED ORDER — LIDOCAINE HCL 2 % EX GEL: CUTANEOUS | Status: DC | PRN

## 2012-02-09 MED ORDER — DOCUSATE SODIUM 100 MG PO CAPS: 100.0000 mg | ORAL_CAPSULE | Freq: Two times a day (BID) | ORAL | Status: AC

## 2012-02-09 NOTE — ED Notes (Signed)
Pt alert & oriented x4, stable gait. Patient given discharge instructions, paperwork & prescription(s). Patient  instructed to stop at the registration desk to finish any additional paperwork. Patient verbalized understanding. Pt left department w/ no further questions. 

## 2012-02-16 ENCOUNTER — Other Ambulatory Visit (HOSPITAL_COMMUNITY)
Admission: RE | Admit: 2012-02-16 | Discharge: 2012-02-16 | Disposition: A | Discharge status: Home / Self Care | Payer: Medicaid Other | Source: Ambulatory Visit | Attending: General Surgery | Admitting: General Surgery

## 2012-02-16 ENCOUNTER — Encounter: Payer: Self-pay | Admitting: General Surgery

## 2012-02-16 DIAGNOSIS — A630 Anogenital (venereal) warts: Secondary | ICD-10-CM | POA: Insufficient documentation

## 2012-02-17 NOTE — Consult Note (Signed)
NAME:  Martha [name], Martha [name]                      ACCOUNT NO.:  MEDICAL RECORD NO.:  [id number]  LOCATION:                                 FACILITY:  PHYSICIAN:  Felicie Morn, M.D. DATE OF BIRTH:  1965-12-25  DATE OF CONSULTATION: DATE OF DISCHARGE:                                CONSULTATION   ADDRESS:  Teressa Lower, MD [address] [place] [address].  BODY:  Dear Aaron Edelman:  I saw Martha [name] in my office on February 16, 2012, after she saw you in the emergency room on February 09, 2012.  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  I was able to take care of this under local anesthesia without complication in my office.  I told her to be sure and follow up with her own doctor who is Dr. Luan Pulling for continued followup of her type 1 diabetes.  I will follow her perioperatively and perform an anoscopy later on to make sure that there are no other intraanal problems.  Again, I thank you kindly for sending this nonemergent referral my way. I was glad to be of some help.     Felicie Morn, M.D.     WB/MEDQ  D:  02/16/2012  T:  02/16/2012  Job:  [id number]  cc:   Dr. Luan Pulling

## 2012-07-28 DIAGNOSIS — J449 Chronic obstructive pulmonary disease, unspecified: Secondary | ICD-10-CM

## 2012-07-28 HISTORY — DX: Chronic obstructive pulmonary disease, unspecified: J44.9

## 2012-08-30 ENCOUNTER — Emergency Department (HOSPITAL_COMMUNITY): Payer: Medicaid Other

## 2012-08-30 ENCOUNTER — Encounter (HOSPITAL_COMMUNITY): Payer: Self-pay | Admitting: *Deleted

## 2012-08-30 ENCOUNTER — Emergency Department (HOSPITAL_COMMUNITY)
Admission: EM | Admit: 2012-08-30 | Discharge: 2012-08-30 | Disposition: A | Discharge status: Home / Self Care | Payer: Medicaid Other | Attending: Emergency Medicine | Admitting: Emergency Medicine

## 2012-08-30 DIAGNOSIS — R6889 Other general symptoms and signs: Secondary | ICD-10-CM | POA: Insufficient documentation

## 2012-08-30 DIAGNOSIS — R062 Wheezing: Secondary | ICD-10-CM | POA: Insufficient documentation

## 2012-08-30 DIAGNOSIS — R110 Nausea: Secondary | ICD-10-CM | POA: Insufficient documentation

## 2012-08-30 DIAGNOSIS — Z8659 Personal history of other mental and behavioral disorders: Secondary | ICD-10-CM | POA: Insufficient documentation

## 2012-08-30 DIAGNOSIS — Z8669 Personal history of other diseases of the nervous system and sense organs: Secondary | ICD-10-CM | POA: Insufficient documentation

## 2012-08-30 DIAGNOSIS — Z87891 Personal history of nicotine dependence: Secondary | ICD-10-CM | POA: Insufficient documentation

## 2012-08-30 DIAGNOSIS — I10 Essential (primary) hypertension: Secondary | ICD-10-CM | POA: Insufficient documentation

## 2012-08-30 DIAGNOSIS — Z794 Long term (current) use of insulin: Secondary | ICD-10-CM | POA: Insufficient documentation

## 2012-08-30 DIAGNOSIS — J3489 Other specified disorders of nose and nasal sinuses: Secondary | ICD-10-CM | POA: Insufficient documentation

## 2012-08-30 DIAGNOSIS — R0602 Shortness of breath: Secondary | ICD-10-CM | POA: Insufficient documentation

## 2012-08-30 DIAGNOSIS — E1169 Type 2 diabetes mellitus with other specified complication: Secondary | ICD-10-CM | POA: Insufficient documentation

## 2012-08-30 DIAGNOSIS — R739 Hyperglycemia, unspecified: Secondary | ICD-10-CM

## 2012-08-30 DIAGNOSIS — Z79899 Other long term (current) drug therapy: Secondary | ICD-10-CM | POA: Insufficient documentation

## 2012-08-30 DIAGNOSIS — J209 Acute bronchitis, unspecified: Secondary | ICD-10-CM

## 2012-08-30 DIAGNOSIS — K219 Gastro-esophageal reflux disease without esophagitis: Secondary | ICD-10-CM | POA: Insufficient documentation

## 2012-08-30 LAB — BASIC METABOLIC PANEL
GFR calc Af Amer: 89 mL/min — ABNORMAL LOW (ref >90)
GFR calc non Af Amer: 77 mL/min — ABNORMAL LOW (ref >90)
Potassium: 4 mEq/L (ref 3.5–5.1)
Sodium: 134 mEq/L — ABNORMAL LOW (ref 135–145)

## 2012-08-30 MED ORDER — INSULIN ASPART 100 UNIT/ML ~~LOC~~ SOLN
20.0000 [IU] | Freq: Once | SUBCUTANEOUS | Status: AC
  Administered 2012-08-30: 20 [IU] via SUBCUTANEOUS
  Filled 2012-08-30: qty 1

## 2012-08-30 MED ORDER — ALBUTEROL SULFATE (5 MG/ML) 0.5% IN NEBU
5.0000 mg | INHALATION_SOLUTION | Freq: Once | RESPIRATORY_TRACT | Status: AC
  Administered 2012-08-30: 5 mg via RESPIRATORY_TRACT
  Filled 2012-08-30: qty 1

## 2012-08-30 MED ORDER — ALBUTEROL SULFATE HFA 108 (90 BASE) MCG/ACT IN AERS
2.0000 | INHALATION_SPRAY | Freq: Once | RESPIRATORY_TRACT | Status: AC
  Administered 2012-08-30: 2 via RESPIRATORY_TRACT
  Filled 2012-08-30 (×2): qty 6.7

## 2012-08-30 MED ORDER — IPRATROPIUM BROMIDE 0.02 % IN SOLN
0.5000 mg | Freq: Once | RESPIRATORY_TRACT | Status: AC
  Administered 2012-08-30: 0.5 mg via RESPIRATORY_TRACT
  Filled 2012-08-30: qty 2.5

## 2012-08-30 NOTE — ED Provider Notes (Signed)
History     CSN: [id number]  Arrival date & time 08/30/12  1603   First MD Initiated Contact with Patient 08/30/12 1647      Chief Complaint  Patient presents with  . Cough    (Consider location/radiation/quality/duration/timing/severity/associated sxs/prior treatment) HPI Comments: Martha [name] is a 47 y.o. Female presenting with URI type symptoms including cough which has been nonproductive, sneezing, nasal congestion with clear rhinorrhea but also with complaints of nausea without emesis, and several episodes of loose watery stools today.  She denies abdominal pain and fever.  She has been intermittently wheezing.   She is taking an OTC cough and cold medication which is not relieving her symptoms.  She denies dizziness,increased thirst and urinary frequency.  Incidentally,  She notes her diabetes is never well controlled,  And she "lives" in the the 400-500 range, but was 325 just prior to arrival here today. She takes levemir and sliding scale novolog,  And states her pcp supplied her with a 10 day sample of Victoza which is the only medicine that controls her diabetes,  The other 20 days of the month she is out of control and has not been able to get her insurance to cover this medicine.  Her pcp is trying to get this med covered for her.      The history is provided by the patient.    Past Medical History  Diagnosis Date  . Diabetes mellitus   . Hypertension   . RLS (restless legs syndrome)   . Acid reflux   . OSA (obstructive sleep apnea)   . Bipolar 1 disorder   . Depression     Past Surgical History  Procedure Date  . Tubal ligation   . Endometrial ablation     Family History  Problem Relation Age of Onset  . Adopted: Yes    History  Substance Use Topics  . Smoking status: Former Smoker -- 1.5 packs/day for 25 years    Types: Cigarettes    Quit date: 10/23/2007  . Smokeless tobacco: Never Used  . Alcohol Use: No    OB History    Grav Para Term Preterm  Abortions TAB SAB Ect Mult Living   2 2 1 1      2       Review of Systems  Constitutional: Negative for fever and chills.  HENT: Positive for congestion and rhinorrhea. Negative for sore throat and neck pain.   Eyes: Negative.   Respiratory: Positive for cough, shortness of breath and wheezing. Negative for chest tightness.   Cardiovascular: Negative for chest pain and palpitations.  Gastrointestinal: Positive for nausea and diarrhea. Negative for vomiting and abdominal pain.  Genitourinary: Negative.  Negative for dysuria and frequency.  Musculoskeletal: Negative for joint swelling and arthralgias.  Skin: Negative.  Negative for rash and wound.  Neurological: Negative for dizziness, weakness, light-headedness, numbness and headaches.  Hematological: Negative.   Psychiatric/Behavioral: Negative.     Allergies  Codeine  Home Medications   Current Outpatient Rx  Name  Route  Sig  Dispense  Refill  . ALPRAZOLAM 1 MG PO TABS   Oral   Take 1 mg by mouth 4 (four) times daily.           . AZITHROMYCIN 250 MG PO TABS      Take two tablets on day one, then one tab qd days 2-5   6 tablet   0   . INSULIN DETEMIR 100 UNIT/ML [place] SOLN  Subcutaneous   Inject 80 Units into the skin 2 (two) times daily.          Marland Kitchen LIDOCAINE HCL 2 % EX GEL   Topical   Apply topically as needed.   30 mL   0   . LISINOPRIL-HYDROCHLOROTHIAZIDE 20-12.5 MG PO TABS   Oral   Take 1 tablet by mouth 2 (two) times daily.          Marland Kitchen OMEPRAZOLE 20 MG PO CPDR   Oral   Take 20 mg by mouth daily.           . OXYCODONE-ACETAMINOPHEN 10-325 MG PO TABS   Oral   Take 1 tablet by mouth 4 (four) times daily.           Marland Kitchen ROPINIROLE HCL 1 MG PO TABS   Oral   Take 1 mg by mouth at bedtime.          Marland Kitchen SIMVASTATIN 20 MG PO TABS   Oral   Take 20 mg by mouth at bedtime.             BP 143/78  Pulse 86  Temp 98.4 F (36.9 C) (Oral)  Resp 20  Ht 5\' 8"  (1.727 m)  Wt 400 lb (181.439 kg)  BMI  60.82 kg/m2  SpO2 98%  Physical Exam  Constitutional: She is oriented to person, place, and time. She appears well-developed and well-nourished.  HENT:  Head: Normocephalic and atraumatic.  Right Ear: Tympanic membrane and ear canal normal.  Left Ear: Tympanic membrane and ear canal normal.  Nose: Rhinorrhea present.  Mouth/Throat: Uvula is midline, oropharynx is clear and moist and mucous membranes are normal. Mucous membranes are not dry. No oropharyngeal exudate, posterior oropharyngeal edema, posterior oropharyngeal erythema or tonsillar abscesses.  Eyes: Conjunctivae normal are normal.  Cardiovascular: Normal rate and normal heart sounds.   Pulmonary/Chest: Effort normal and breath sounds normal. No respiratory distress. She has no wheezes. She has no rales.  Abdominal: Soft. There is no tenderness.  Musculoskeletal: Normal range of motion.  Neurological: She is alert and oriented to person, place, and time.  Skin: Skin is warm and dry. No rash noted.  Psychiatric: She has a normal mood and affect.    ED Course  Procedures (including critical care time)  Labs Reviewed - No data to display No results found.   No diagnosis found.   6:45 PM at reexam, patient is now actively with expiratory wheeze bilateral mid lung fields.  Will give albuterol and Atrovent nebulizer prior to discharge home.  Also albuterol MDI given for home use.  Examined post albuterol neb,  No wheezing,  Clear lung fields throughout.    Pt given her sliding scale dose of 20 units SQ novolog prior to dc as her bmet results glucose 420,  No acidosis. Pt has no nausea, vomiting, abd pain, weakness or other sx suggesting acute hyperglycemic event.  MDM  Pt without any evidence for acidosis,  States her presenting blood glucose levels are her norm and primarily not her reason for being here or her concern tonight.  She does feel improved after receiving the nebulizer tx.  She understands she needs to take her  Levemir once home and that she needs close f/u with Dr Luan Pulling - to call his office for appt tomorrow.  Also had our [place] speak with patient re ways she might be able to get her Victoza filled.   She was given an albuterol mdi for  home use.    Chest xray and labs reviewed prior to dc home.          Evalee Jefferson, [place] 08/31/12 [phone]

## 2012-08-30 NOTE — ED Notes (Signed)
Nausea, cough, sneezing , runny nose.  Nonproductive cough

## 2012-09-01 ENCOUNTER — Emergency Department (HOSPITAL_COMMUNITY)
Admission: EM | Admit: 2012-09-01 | Discharge: 2012-09-01 | Disposition: A | Discharge status: Home / Self Care | Payer: Medicaid Other | Attending: Emergency Medicine | Admitting: Emergency Medicine

## 2012-09-01 ENCOUNTER — Encounter (HOSPITAL_COMMUNITY): Payer: Self-pay

## 2012-09-01 DIAGNOSIS — G4733 Obstructive sleep apnea (adult) (pediatric): Secondary | ICD-10-CM | POA: Insufficient documentation

## 2012-09-01 DIAGNOSIS — R112 Nausea with vomiting, unspecified: Secondary | ICD-10-CM

## 2012-09-01 DIAGNOSIS — F319 Bipolar disorder, unspecified: Secondary | ICD-10-CM | POA: Insufficient documentation

## 2012-09-01 DIAGNOSIS — R6889 Other general symptoms and signs: Secondary | ICD-10-CM | POA: Insufficient documentation

## 2012-09-01 DIAGNOSIS — J029 Acute pharyngitis, unspecified: Secondary | ICD-10-CM | POA: Insufficient documentation

## 2012-09-01 DIAGNOSIS — Z79899 Other long term (current) drug therapy: Secondary | ICD-10-CM | POA: Insufficient documentation

## 2012-09-01 DIAGNOSIS — Z87891 Personal history of nicotine dependence: Secondary | ICD-10-CM | POA: Insufficient documentation

## 2012-09-01 DIAGNOSIS — K219 Gastro-esophageal reflux disease without esophagitis: Secondary | ICD-10-CM | POA: Insufficient documentation

## 2012-09-01 DIAGNOSIS — J3489 Other specified disorders of nose and nasal sinuses: Secondary | ICD-10-CM | POA: Insufficient documentation

## 2012-09-01 DIAGNOSIS — R0602 Shortness of breath: Secondary | ICD-10-CM | POA: Insufficient documentation

## 2012-09-01 DIAGNOSIS — Z794 Long term (current) use of insulin: Secondary | ICD-10-CM | POA: Insufficient documentation

## 2012-09-01 DIAGNOSIS — E669 Obesity, unspecified: Secondary | ICD-10-CM | POA: Insufficient documentation

## 2012-09-01 DIAGNOSIS — J9801 Acute bronchospasm: Secondary | ICD-10-CM

## 2012-09-01 DIAGNOSIS — R197 Diarrhea, unspecified: Secondary | ICD-10-CM | POA: Insufficient documentation

## 2012-09-01 DIAGNOSIS — J40 Bronchitis, not specified as acute or chronic: Secondary | ICD-10-CM

## 2012-09-01 DIAGNOSIS — R062 Wheezing: Secondary | ICD-10-CM | POA: Insufficient documentation

## 2012-09-01 DIAGNOSIS — E119 Type 2 diabetes mellitus without complications: Secondary | ICD-10-CM | POA: Insufficient documentation

## 2012-09-01 DIAGNOSIS — I10 Essential (primary) hypertension: Secondary | ICD-10-CM | POA: Insufficient documentation

## 2012-09-01 DIAGNOSIS — Z8739 Personal history of other diseases of the musculoskeletal system and connective tissue: Secondary | ICD-10-CM | POA: Insufficient documentation

## 2012-09-01 LAB — POCT I-STAT, CHEM 8
Hemoglobin: 12.9 g/dL (ref 12.0–15.0)
Sodium: 135 mEq/L (ref 135–145)
TCO2: 23 mmol/L (ref 0–100)

## 2012-09-01 LAB — GLUCOSE, CAPILLARY: Glucose-Capillary: 449 mg/dL — ABNORMAL HIGH (ref 70–99)

## 2012-09-01 MED ORDER — MUCINEX DM 30-600 MG PO TB12: 1.0000 | ORAL_TABLET | Freq: Two times a day (BID) | ORAL | Status: DC

## 2012-09-01 MED ORDER — PROMETHAZINE HCL 25 MG PO TABS: 25.0000 mg | ORAL_TABLET | Freq: Four times a day (QID) | ORAL | Status: DC | PRN

## 2012-09-01 MED ORDER — IPRATROPIUM BROMIDE 0.02 % IN SOLN
0.5000 mg | Freq: Once | RESPIRATORY_TRACT | Status: AC
  Administered 2012-09-01: 0.5 mg via RESPIRATORY_TRACT
  Filled 2012-09-01: qty 2.5

## 2012-09-01 MED ORDER — ALBUTEROL SULFATE (5 MG/ML) 0.5% IN NEBU
5.0000 mg | INHALATION_SOLUTION | Freq: Once | RESPIRATORY_TRACT | Status: AC
  Administered 2012-09-01: 5 mg via RESPIRATORY_TRACT
  Filled 2012-09-01: qty 1

## 2012-09-01 MED ORDER — ONDANSETRON 8 MG PO TBDP
8.0000 mg | ORAL_TABLET | Freq: Once | ORAL | Status: AC
  Administered 2012-09-01: 8 mg via ORAL
  Filled 2012-09-01: qty 1

## 2012-09-01 NOTE — ED Notes (Signed)
Pt's pulse ox. Stayed between 90-91, pt states she felt fine while ambulating. RN is aware.

## 2012-09-01 NOTE — ED Provider Notes (Signed)
History   This chart was scribed for Martha Norrie, MD by Martha [name], ED Scribe. The patient was seen in room APA19/APA19. Patient's care was started at 0753.    CSN: [id number]  Arrival date & time 09/01/12  0751   First MD Initiated Contact with Patient 09/01/12 [phone]      Chief Complaint  Patient presents with  . Cough  . Nasal Congestion    The history is provided by the patient. No language interpreter was used.  Martha [name] is a 47 y.o. female who presents to the Emergency Department complaining of 4 days of persistent, moderate dry cough with associated sneezing, rhinorrhea, sore throat, SOB and wheezing. She reports she had one episode of vomiting this morning and has had some diarrhea which is chronic. She was seen on 08/30/12 and was given an inhaler for bronchitis. She reports she has been using her inhaler every hour with mild relief. She denies any fever, chills. She states her SOB is aggravated with laying down and coughs frequently at night. She denies taking any OTC medications for her cough. She denies any prior hospitalizations due to breathing issues. She has a h/o bronchitis.   She does not check her CBG's at home, states she hasn't taken her medications yet today b/o the vomiting episode.   PCP: Dr. Luan [name]  Past Medical History  Diagnosis Date  . Diabetes mellitus   . Hypertension   . RLS (restless legs syndrome)   . Acid reflux   . OSA (obstructive sleep apnea)   . Bipolar 1 disorder   . Depression     Past Surgical History  Procedure Date  . Tubal ligation   . Endometrial ablation     Family History  Problem Relation Age of Onset  . Adopted: Yes    History  Substance Use Topics  . Smoking status: Former Smoker -- 1.5 packs/day for 25 years    Types: Cigarettes    Quit date: 10/23/2007  . Smokeless tobacco: Never Used  . Alcohol Use: No  She reports smoking cessation 3 years ago Denies alcohol use On disability for DM and HTN She  lives with daughter   OB History    Grav Para Term Preterm Abortions TAB SAB Ect Mult Living   2 2 1 1      2       Review of Systems  Constitutional: Negative for fever and chills.  HENT: Positive for congestion, sore throat, rhinorrhea and sneezing.   Respiratory: Positive for cough, shortness of breath and wheezing.   All other systems reviewed and are negative.    Allergies  Codeine  Home Medications   Current Outpatient Rx  Name  Route  Sig  Dispense  Refill  . ALPRAZOLAM 1 MG PO TABS   Oral   Take 1 mg by mouth 4 (four) times daily.           Marland Kitchen DEXTROMETHORPHAN-GUAIFENESIN 10-100 MG/5ML PO LIQD   Oral   Take 5 mLs by mouth once as needed. For cold/flu symptoms         . INSULIN ASPART 100 UNIT/ML [place] SOLN   Subcutaneous   Inject 6-12 Units into the skin 3 (three) times daily before meals. As directed per sliding scale         . INSULIN DETEMIR 100 UNIT/ML [place] SOLN   Subcutaneous   Inject 80 Units into the skin 2 (two) times daily.          Marland Kitchen  LISINOPRIL-HYDROCHLOROTHIAZIDE 20-12.5 MG PO TABS   Oral   Take 1 tablet by mouth 2 (two) times daily.          Marland Kitchen OMEPRAZOLE 20 MG PO CPDR   Oral   Take 20 mg by mouth daily.           . OXYCODONE-ACETAMINOPHEN 10-325 MG PO TABS   Oral   Take 1 tablet by mouth 4 (four) times daily.           Marland Kitchen ROPINIROLE HCL 1 MG PO TABS   Oral   Take 1 mg by mouth at bedtime.          Marland Kitchen SIMVASTATIN 20 MG PO TABS   Oral   Take 20 mg by mouth at bedtime.             Triage Vitals: BP 176/99  Pulse 110  Temp 98.4 F (36.9 C) (Oral)  Resp 22  SpO2 95%  Vital signs normal except hypertension and tachycardia  Physical Exam  Nursing note and vitals reviewed. Constitutional: She is oriented to person, place, and time. She appears well-developed and well-nourished. No distress.       Obese.  HENT:  Head: Normocephalic and atraumatic.  Right Ear: External ear normal.  Left Ear: External ear normal.  Nose:  Nose normal.  Mouth/Throat: Oropharynx is clear and moist. No oropharyngeal exudate.  Eyes: Conjunctivae normal and EOM are normal. Pupils are equal, round, and reactive to light.  Neck: Normal range of motion. Neck supple. No tracheal deviation present.  Cardiovascular: Normal rate, regular rhythm and normal heart sounds.  Exam reveals no gallop and no friction rub.   No murmur heard. Pulmonary/Chest: Effort normal. No respiratory distress. She has decreased breath sounds. She has no wheezes. She has no rhonchi. She has no rales.  Abdominal: Soft. Bowel sounds are normal. She exhibits no distension. There is no tenderness. There is no rebound and no guarding.  Musculoskeletal: Normal range of motion. She exhibits no edema.  Neurological: She is alert and oriented to person, place, and time.  Skin: Skin is warm and dry.  Psychiatric: She has a normal mood and affect. Her behavior is normal.    ED Course  Procedures (including critical care time)   Medications  albuterol (PROVENTIL) (5 MG/ML) 0.5% nebulizer solution 5 mg (5 mg Nebulization Given 09/01/12 0858)  ipratropium (ATROVENT) nebulizer solution 0.5 mg (0.5 mg Nebulization Given 09/01/12 0858)     DIAGNOSTIC STUDIES: Oxygen Saturation is 95% on room air, adequate by my interpretation.    COORDINATION OF CARE:  08:10-Discussed planned course of treatment with the patient including a CBG, breathing treatment and Zofran, who is agreeable at this time.   08:33-Recheck: Informed pt of high CBG. Will order additional blood work. She is agreeable with plan.   09:10-Recheck: She states that she normally takes 80 units of Novolog in the morning. Discussed ordering insulin in ED and she wants to take hers when she gets home. She reports improvement of her symptoms after breathing treatment. Upon re-evaluation, lungs are clear. She has a spacer for her inhaler. Will d/c home.   Review of prior ED visits shows her CBG is usually in the high  300-400 range. Pt has only had cough for 4 days and she isn't a smoker, antibiotics not started today. Would like to start prednisone but with her uncontrolled diabetes feel it would not be wise.   Results for orders placed during the hospital encounter of 09/01/12  GLUCOSE, CAPILLARY      Component Value Range   Glucose-Capillary 449 (*) 70 - 99 mg/dL  POCT I-STAT, CHEM 8      Component Value Range   Sodium 135  135 - 145 mEq/L   Potassium 4.0  3.5 - 5.1 mEq/L   Chloride 105  96 - 112 mEq/L   BUN 8  6 - 23 mg/dL   Creatinine, Ser 1.10  0.50 - 1.10 mg/dL   Glucose, Bld 428 (*) 70 - 99 mg/dL   Calcium, Ion 1.14  1.12 - 1.23 mmol/L   TCO2 23  0 - 100 mmol/L   Hemoglobin 12.9  12.0 - 15.0 g/dL   HCT 38.0  36.0 - 46.0 %   Laboratory interpretation all normal except hyperglycemia  1. Bronchitis   2. Nausea and vomiting   3. Bronchospasm     New Prescriptions   DEXTROMETHORPHAN-GUAIFENESIN (MUCINEX DM) 30-600 MG TB12    Take 1 tablet by mouth 2 (two) times daily.   PROMETHAZINE (PHENERGAN) 25 MG TABLET    Take 1 tablet (25 mg total) by mouth every 6 (six) hours as needed for nausea.   Plan discharge  Rolland Porter, MD, FACEP   MDM    I personally performed the services described in this documentation, which was scribed in my presence. The recorded information has been reviewed and considered.  Rolland Porter, MD, FACEP      Martha Norrie, MD 09/01/12 [phone]

## 2012-09-01 NOTE — ED Provider Notes (Signed)
Medical screening examination/treatment/procedure(s) were performed by non-physician practitioner and as supervising physician I was immediately available for consultation/collaboration. Rolland Porter, MD, [facility]   [name], MD 09/01/12 [phone]

## 2012-09-01 NOTE — ED Notes (Signed)
Sick since sat w/ cough and sneezing, treated in er 2 days ago and told bronchitis.  Today woke up and vomited x 1.

## 2012-10-08 DIAGNOSIS — Z6841 Body Mass Index (BMI) 40.0 and over, adult: Secondary | ICD-10-CM | POA: Insufficient documentation

## 2013-01-12 ENCOUNTER — Encounter (HOSPITAL_COMMUNITY): Payer: Self-pay | Admitting: *Deleted

## 2013-01-12 ENCOUNTER — Emergency Department (HOSPITAL_COMMUNITY)
Admission: EM | Admit: 2013-01-12 | Discharge: 2013-01-12 | Disposition: A | Discharge status: Home / Self Care | Payer: Medicaid Other | Attending: Emergency Medicine | Admitting: Emergency Medicine

## 2013-01-12 DIAGNOSIS — G2581 Restless legs syndrome: Secondary | ICD-10-CM | POA: Insufficient documentation

## 2013-01-12 DIAGNOSIS — Z794 Long term (current) use of insulin: Secondary | ICD-10-CM | POA: Insufficient documentation

## 2013-01-12 DIAGNOSIS — E119 Type 2 diabetes mellitus without complications: Secondary | ICD-10-CM | POA: Insufficient documentation

## 2013-01-12 DIAGNOSIS — Z87891 Personal history of nicotine dependence: Secondary | ICD-10-CM | POA: Insufficient documentation

## 2013-01-12 DIAGNOSIS — Z3202 Encounter for pregnancy test, result negative: Secondary | ICD-10-CM | POA: Insufficient documentation

## 2013-01-12 DIAGNOSIS — J3489 Other specified disorders of nose and nasal sinuses: Secondary | ICD-10-CM | POA: Insufficient documentation

## 2013-01-12 DIAGNOSIS — K219 Gastro-esophageal reflux disease without esophagitis: Secondary | ICD-10-CM | POA: Insufficient documentation

## 2013-01-12 DIAGNOSIS — R51 Headache: Secondary | ICD-10-CM | POA: Insufficient documentation

## 2013-01-12 DIAGNOSIS — G4733 Obstructive sleep apnea (adult) (pediatric): Secondary | ICD-10-CM | POA: Insufficient documentation

## 2013-01-12 DIAGNOSIS — Z79899 Other long term (current) drug therapy: Secondary | ICD-10-CM | POA: Insufficient documentation

## 2013-01-12 DIAGNOSIS — F319 Bipolar disorder, unspecified: Secondary | ICD-10-CM | POA: Insufficient documentation

## 2013-01-12 DIAGNOSIS — R110 Nausea: Secondary | ICD-10-CM | POA: Insufficient documentation

## 2013-01-12 DIAGNOSIS — R519 Headache, unspecified: Secondary | ICD-10-CM

## 2013-01-12 DIAGNOSIS — I10 Essential (primary) hypertension: Secondary | ICD-10-CM

## 2013-01-12 LAB — CBC WITH DIFFERENTIAL/PLATELET
Basophils Absolute: 0.1 10*3/uL (ref 0.0–0.1)
Eosinophils Absolute: 0.2 10*3/uL (ref 0.0–0.7)
Lymphocytes Relative: 46 % (ref 12–46)
Lymphs Abs: 3 10*3/uL (ref 0.7–4.0)
MCH: 29.5 pg (ref 26.0–34.0)
Neutrophils Relative %: 45 % (ref 43–77)
Platelets: 193 10*3/uL (ref 150–400)
RBC: 5.16 MIL/uL — ABNORMAL HIGH (ref 3.87–5.11)
RDW: 13.6 % (ref 11.5–15.5)
WBC: 6.4 10*3/uL (ref 4.0–10.5)

## 2013-01-12 LAB — URINE MICROSCOPIC-ADD ON

## 2013-01-12 LAB — COMPREHENSIVE METABOLIC PANEL
ALT: 21 U/L (ref 0–35)
AST: 12 U/L (ref 0–37)
Alkaline Phosphatase: 82 U/L (ref 39–117)
GFR calc Af Amer: 79 mL/min — ABNORMAL LOW (ref >90)
Glucose, Bld: 312 mg/dL — ABNORMAL HIGH (ref 70–99)
Potassium: 4.1 mEq/L (ref 3.5–5.1)
Sodium: 134 mEq/L — ABNORMAL LOW (ref 135–145)
Total Protein: 6.5 g/dL (ref 6.0–8.3)

## 2013-01-12 LAB — URINALYSIS, ROUTINE W REFLEX MICROSCOPIC
Bilirubin Urine: NEGATIVE
Glucose, UA: >1000 mg/dL — AB
Specific Gravity, Urine: 1.025 (ref 1.005–1.030)
Urobilinogen, UA: 0.2 mg/dL (ref 0.0–1.0)
pH: 6 (ref 5.0–8.0)

## 2013-01-12 LAB — PREGNANCY, URINE: Preg Test, Ur: NEGATIVE

## 2013-01-12 MED ORDER — IBUPROFEN 400 MG PO TABS
400.0000 mg | ORAL_TABLET | Freq: Once | ORAL | Status: AC
  Administered 2013-01-12: 400 mg via ORAL
  Filled 2013-01-12: qty 1

## 2013-01-12 MED ORDER — HYDROCODONE-ACETAMINOPHEN 5-325 MG PO TABS
1.0000 | ORAL_TABLET | Freq: Once | ORAL | Status: AC
  Administered 2013-01-12: 1 via ORAL
  Filled 2013-01-12: qty 1

## 2013-01-12 NOTE — ED Notes (Signed)
Pt c/o headache and nausea x 2 weeks.  Saw Dr. Luan Pulling yesterday and bp medication was changed.  Pt still c/o headache and nausea this am.

## 2013-01-12 NOTE — ED Provider Notes (Signed)
History     CSN: [id number]  Arrival date & time 01/12/13  [id number]   First MD Initiated Contact with Patient 01/12/13 [phone]      Chief Complaint  Patient presents with  . Hypertension     HPI Pt was seen at [place].  Per pt, c/o gradual onset and persistence of constant frontal headache, sinus congestion, nasal congestion and nausea for the past 2 weeks. Pt states she was eval by her PMD yesterday for same with her "BP meds changed around."  States she "still feels the same."  States her "face hurts" when she puts on her CPAP mask at night. Denies sore throat, no fevers, no rash, no neck pain, no CP/palpitations, no SOB/cough, no abd pain, no vomiting/diarrhea. Denies headache was sudden or maximal in onset or at any time.  Denies visual changes, no focal motor weakness, no tingling/numbness in extremities.      Past Medical History  Diagnosis Date  . Diabetes mellitus   . Hypertension   . RLS (restless legs syndrome)   . Acid reflux   . OSA (obstructive sleep apnea)   . Bipolar 1 disorder   . Depression     Past Surgical History  Procedure Laterality Date  . Tubal ligation    . Endometrial ablation      Family History  Problem Relation Age of Onset  . Adopted: Yes    History  Substance Use Topics  . Smoking status: Former Smoker -- 1.50 packs/day for 25 years    Types: Cigarettes    Quit date: 10/23/2007  . Smokeless tobacco: Never Used  . Alcohol Use: No    OB History   Grav Para Term Preterm Abortions TAB SAB Ect Mult Living   2 2 1 1      2       Review of Systems ROS: Statement: All systems negative except as marked or noted in the HPI; Constitutional: Negative for fever and chills. ; ; Eyes: Negative for eye pain, redness and discharge. ; ; ENMT: Negative for ear pain, hoarseness, sore throat. +nasal congestion and sinus pressure. ; ; Cardiovascular: Negative for chest pain, palpitations, diaphoresis, dyspnea and peripheral edema. ; ; Respiratory: Negative for  cough, wheezing and stridor. ; ; Gastrointestinal: +nausea. Negative for vomiting, diarrhea, abdominal pain, blood in stool, hematemesis, jaundice and rectal bleeding. . ; ; Genitourinary: Negative for dysuria, flank pain and hematuria. ; ; Musculoskeletal: Negative for back pain and neck pain. Negative for swelling and trauma.; ; Skin: Negative for pruritus, rash, abrasions, blisters, bruising and skin lesion.; ; Neuro: Negative for headache, lightheadedness and neck stiffness. Negative for weakness, altered level of consciousness , altered mental status, extremity weakness, paresthesias, involuntary movement, seizure and syncope.       Allergies  Codeine  Home Medications   Current Outpatient Rx  Name  Route  Sig  Dispense  Refill  . ALPRAZolam (XANAX) 1 MG tablet   Oral   Take 1 mg by mouth 4 (four) times daily.           Marland Kitchen Dextromethorphan-Guaifenesin (MUCINEX DM) 30-600 MG TB12   Oral   Take 1 tablet by mouth 2 (two) times daily.   28 each   0   . dextromethorphan-guaiFENesin (TUSSIN DM) 10-100 MG/5ML liquid   Oral   Take 5 mLs by mouth once as needed. For cold/flu symptoms         . insulin aspart (NOVOLOG FLEXPEN) 100 UNIT/ML injection   Subcutaneous  Inject 6-12 Units into the skin 3 (three) times daily before meals. As directed per sliding scale         . insulin detemir (LEVEMIR) 100 UNIT/ML injection   Subcutaneous   Inject 80 Units into the skin 2 (two) times daily.          Marland Kitchen lisinopril-hydrochlorothiazide (PRINZIDE,ZESTORETIC) 20-12.5 MG per tablet   Oral   Take 1 tablet by mouth 2 (two) times daily.          Marland Kitchen omeprazole (PRILOSEC) 20 MG capsule   Oral   Take 20 mg by mouth daily.           Marland Kitchen oxyCODONE-acetaminophen (PERCOCET) 10-325 MG per tablet   Oral   Take 1 tablet by mouth 4 (four) times daily.           . promethazine (PHENERGAN) 25 MG tablet   Oral   Take 1 tablet (25 mg total) by mouth every 6 (six) hours as needed for nausea.    10 tablet   0   . rOPINIRole (REQUIP) 1 MG tablet   Oral   Take 1 mg by mouth at bedtime.          . simvastatin (ZOCOR) 20 MG tablet   Oral   Take 20 mg by mouth at bedtime.             BP 160/99  Pulse 76  Temp(Src) 97.6 F (36.4 C) (Oral)  SpO2 98%  Physical Exam 0720: Physical examination:  Nursing notes reviewed; Vital signs and O2 SAT reviewed;  Constitutional: Well developed, Well nourished, Well hydrated, In no acute distress; Head:  Normocephalic, atraumatic; Eyes: EOMI, PERRL, No scleral icterus; ENMT: TM's clear bilat. +edemetous nasal turbinates bilat with clear rhinorrhea. +tenderness to percuss frontal and maxillary sinuses. Mouth and pharynx normal, Mucous membranes moist; Neck: Supple, Full range of motion, No lymphadenopathy; Cardiovascular: Regular rate and rhythm, No murmur, rub, or gallop; Respiratory: Breath sounds clear & equal bilaterally, No rales, rhonchi, wheezes.  Speaking full sentences with ease, Normal respiratory effort/excursion; Chest: Nontender, Movement normal; Abdomen: Soft, Nontender, Nondistended, Normal bowel sounds; Genitourinary: No CVA tenderness; Extremities: Pulses normal, No tenderness, No edema, No calf edema or asymmetry.; Neuro: AA&Ox3, Major CN grossly intact. No facial droop. Speech clear. Climbs on and off stretcher easily by herself. Gait steady. No gross focal motor or sensory deficits in extremities.; Skin: Color normal, Warm, Dry.   ED Course  Procedures      MDM  MDM Reviewed: previous chart, nursing note and vitals Interpretation: labs   Results for orders placed during the hospital encounter of 01/12/13  CBC WITH DIFFERENTIAL      Result Value Range   WBC 6.4  4.0 - 10.5 K/uL   RBC 5.16 (*) 3.87 - 5.11 MIL/uL   Hemoglobin 15.2 (*) 12.0 - 15.0 g/dL   HCT 44.1  36.0 - 46.0 %   MCV 85.5  78.0 - 100.0 fL   MCH 29.5  26.0 - 34.0 pg   MCHC 34.5  30.0 - 36.0 g/dL   RDW 13.6  11.5 - 15.5 %   Platelets 193  150 - 400  K/uL   Neutrophils Relative % 45  43 - 77 %   Neutro Abs 2.9  1.7 - 7.7 K/uL   Lymphocytes Relative 46  12 - 46 %   Lymphs Abs 3.0  0.7 - 4.0 K/uL   Monocytes Relative 5  3 - 12 %   Monocytes Absolute  0.3  0.1 - 1.0 K/uL   Eosinophils Relative 3  0 - 5 %   Eosinophils Absolute 0.2  0.0 - 0.7 K/uL   Basophils Relative 1  0 - 1 %   Basophils Absolute 0.1  0.0 - 0.1 K/uL  COMPREHENSIVE METABOLIC PANEL      Result Value Range   Sodium 134 (*) 135 - 145 mEq/L   Potassium 4.1  3.5 - 5.1 mEq/L   Chloride 98  96 - 112 mEq/L   CO2 27  19 - 32 mEq/L   Glucose, Bld 312 (*) 70 - 99 mg/dL   BUN 25 (*) 6 - 23 mg/dL   Creatinine, Ser 0.99  0.50 - 1.10 mg/dL   Calcium 9.6  8.4 - 10.5 mg/dL   Total Protein 6.5  6.0 - 8.3 g/dL   Albumin 2.7 (*) 3.5 - 5.2 g/dL   AST 12  0 - 37 U/L   ALT 21  0 - 35 U/L   Alkaline Phosphatase 82  39 - 117 U/L   Total Bilirubin 0.2 (*) 0.3 - 1.2 mg/dL   GFR calc non Af Amer 68 (*) >90 mL/min   GFR calc Af Amer 79 (*) >90 mL/min  LIPASE, BLOOD      Result Value Range   Lipase 62 (*) 11 - 59 U/L  URINALYSIS, ROUTINE W REFLEX MICROSCOPIC      Result Value Range   Color, Urine YELLOW  YELLOW   APPearance CLEAR  CLEAR   Specific Gravity, Urine 1.025  1.005 - 1.030   pH 6.0  5.0 - 8.0   Glucose, UA >1000 (*) NEGATIVE mg/dL   Hgb urine dipstick TRACE (*) NEGATIVE   Bilirubin Urine NEGATIVE  NEGATIVE   Ketones, ur NEGATIVE  NEGATIVE mg/dL   Protein, ur >300 (*) NEGATIVE mg/dL   Urobilinogen, UA 0.2  0.0 - 1.0 mg/dL   Nitrite NEGATIVE  NEGATIVE   Leukocytes, UA NEGATIVE  NEGATIVE  PREGNANCY, URINE      Result Value Range   Preg Test, Ur NEGATIVE  NEGATIVE  URINE MICROSCOPIC-ADD ON      Result Value Range   Squamous Epithelial / LPF MANY (*) RARE   WBC, UA 0-2  <3 WBC/hpf   RBC / HPF 0-2  <3 RBC/hpf   Bacteria, UA FEW (*) RARE     0820:  Pt medicated for pain. Labs reassuring. Glucose elevated, but pt not acidotic with AG 9.  Neuro exam continues intact,  abd exam benign. No c/o CP/SOB. Pt reassured. Wants to go home now. Tx sinus congestion symptomatically at this time. Dx and testing d/w pt and family.  Questions answered.  Verb understanding, agreeable to d/c home with outpt f/u.            Alfonzo Feller, DO 01/12/13 2212

## 2013-01-12 NOTE — ED Notes (Signed)
States she has felt bad for the past 2 weeks, today she has a headache and feels nauseated, states Dr Luan Pulling changed her meds yesterday and she still feels bad

## 2013-02-11 ENCOUNTER — Emergency Department (HOSPITAL_COMMUNITY)
Admission: EM | Admit: 2013-02-11 | Discharge: 2013-02-11 | Disposition: A | Discharge status: Home / Self Care | Payer: Medicaid Other | Attending: Emergency Medicine | Admitting: Emergency Medicine

## 2013-02-11 ENCOUNTER — Encounter (HOSPITAL_COMMUNITY): Payer: Self-pay | Admitting: *Deleted

## 2013-02-11 ENCOUNTER — Emergency Department (HOSPITAL_COMMUNITY): Payer: Medicaid Other

## 2013-02-11 DIAGNOSIS — Y9389 Activity, other specified: Secondary | ICD-10-CM | POA: Insufficient documentation

## 2013-02-11 DIAGNOSIS — Z79899 Other long term (current) drug therapy: Secondary | ICD-10-CM | POA: Insufficient documentation

## 2013-02-11 DIAGNOSIS — K219 Gastro-esophageal reflux disease without esophagitis: Secondary | ICD-10-CM | POA: Insufficient documentation

## 2013-02-11 DIAGNOSIS — I10 Essential (primary) hypertension: Secondary | ICD-10-CM | POA: Insufficient documentation

## 2013-02-11 DIAGNOSIS — E119 Type 2 diabetes mellitus without complications: Secondary | ICD-10-CM | POA: Insufficient documentation

## 2013-02-11 DIAGNOSIS — Z87891 Personal history of nicotine dependence: Secondary | ICD-10-CM | POA: Insufficient documentation

## 2013-02-11 DIAGNOSIS — S8010XA Contusion of unspecified lower leg, initial encounter: Secondary | ICD-10-CM | POA: Insufficient documentation

## 2013-02-11 DIAGNOSIS — G2581 Restless legs syndrome: Secondary | ICD-10-CM | POA: Insufficient documentation

## 2013-02-11 DIAGNOSIS — Y92410 Unspecified street and highway as the place of occurrence of the external cause: Secondary | ICD-10-CM | POA: Insufficient documentation

## 2013-02-11 DIAGNOSIS — Z8669 Personal history of other diseases of the nervous system and sense organs: Secondary | ICD-10-CM | POA: Insufficient documentation

## 2013-02-11 DIAGNOSIS — S8012XA Contusion of left lower leg, initial encounter: Secondary | ICD-10-CM

## 2013-02-11 DIAGNOSIS — F319 Bipolar disorder, unspecified: Secondary | ICD-10-CM | POA: Insufficient documentation

## 2013-02-11 MED ORDER — ONDANSETRON 4 MG PO TBDP
4.0000 mg | ORAL_TABLET | Freq: Once | ORAL | Status: AC
  Administered 2013-02-11: 4 mg via ORAL
  Filled 2013-02-11: qty 1

## 2013-02-11 MED ORDER — HYDROCODONE-ACETAMINOPHEN 5-325 MG PO TABS
1.0000 | ORAL_TABLET | Freq: Once | ORAL | Status: AC
  Administered 2013-02-11: 1 via ORAL
  Filled 2013-02-11: qty 1

## 2013-02-11 NOTE — ED Notes (Signed)
Pt fell c/o knee pain. Bruising to left knee, leg, and ankle.

## 2013-02-11 NOTE — ED Notes (Signed)
Lt lower leg is greatly constused. And swollen,  Fell on Monday and struck lt lower leg on side board of truck.Good pedal pulses. Seen by Dr Luan Pulling yesterday for this and told to cont taking her same pain pills.

## 2013-02-11 NOTE — ED Provider Notes (Signed)
History    CSN: [id number] Arrival date & time 02/11/13  2121  First MD Initiated Contact with Patient 02/11/13 2308     Chief Complaint  Patient presents with  . Fall  . Leg Pain  . Knee Pain   (Consider location/radiation/quality/duration/timing/severity/associated sxs/prior Treatment) Patient is a 47 y.o. female presenting with fall, leg pain, and knee pain. The history is provided by the patient.  Fall This is a new problem. The current episode started in the past 7 days. The problem has been gradually worsening. Pertinent negatives include no fever, nausea, neck pain or vomiting. The symptoms are aggravated by standing and walking. She has tried oral narcotics (prednisone) for the symptoms. The treatment provided no relief.  Leg Pain Location:  Leg Time since incident:  5 days Injury: yes   Leg location:  L leg and L lower leg Pain details:    Quality:  Throbbing   Severity:  Severe   Onset quality:  Sudden   Duration:  5 days   Timing:  Constant Associated symptoms: no fever and no neck pain   Knee Pain Associated symptoms: no fever and no neck pain    Martha [name] is a 47 y.o. female who presents to the ED with left lower leg pain. The pain started 5 days ago when she was getting into a truck and she slipped and fell. She went to see Dr. Luan Pulling yesterday and was started on prednisone. She also has Oxycodone at home for pain. Today she was getting into her car and closed the same leg in the car door. She complains of increased pain and swelling. She wants to be sure she didn't break anything. Past Medical History  Diagnosis Date  . Diabetes mellitus   . Hypertension   . RLS (restless legs syndrome)   . Acid reflux   . OSA (obstructive sleep apnea)   . Bipolar 1 disorder   . Depression    Past Surgical History  Procedure Laterality Date  . Tubal ligation    . Endometrial ablation     Family History  Problem Relation Age of Onset  . Adopted: Yes   History    Substance Use Topics  . Smoking status: Former Smoker -- 1.50 packs/day for 25 years    Types: Cigarettes    Quit date: 10/23/2007  . Smokeless tobacco: Never Used  . Alcohol Use: No   OB History   Grav Para Term Preterm Abortions TAB SAB Ect Mult Living   2 2 1 1      2      Review of Systems  Constitutional: Negative for fever and appetite change.  HENT: Negative for neck pain.   Respiratory: Negative for chest tightness.   Gastrointestinal: Negative for nausea and vomiting.  Musculoskeletal:       Left lower leg pain  Skin: Negative for wound.       Ecchymosis of left lower leg  Psychiatric/Behavioral: The patient is not nervous/anxious.     Allergies  Codeine  Home Medications   Current Outpatient Rx  Name  Route  Sig  Dispense  Refill  . albuterol (PROAIR HFA) 108 (90 BASE) MCG/ACT inhaler   Inhalation   Inhale 2 puffs into the lungs every 6 (six) hours as needed for wheezing or shortness of breath.         Marland Kitchen albuterol (PROVENTIL) (2.5 MG/3ML) 0.083% nebulizer solution   Nebulization   Take 2.5 mg by nebulization every 6 (six) hours  as needed for wheezing or shortness of breath.         . ALPRAZolam (XANAX) 1 MG tablet   Oral   Take 1 mg by mouth 4 (four) times daily.           Marland Kitchen amLODipine (NORVASC) 10 MG tablet   Oral   Take 10 mg by mouth daily.         . Canagliflozin (INVOKANA) 300 MG TABS   Oral   Take 300 mg by mouth daily.         . Liraglutide (VICTOZA) 18 MG/3ML SOPN   Subcutaneous   Inject 1.8 mLs into the skin daily.         Marland Kitchen lisinopril-hydrochlorothiazide (PRINZIDE,ZESTORETIC) 20-12.5 MG per tablet   Oral   Take 1 tablet by mouth 2 (two) times daily.          . methylPREDNISolone (MEDROL DOSEPAK) 4 MG tablet   Oral   Take by mouth as directed. follow package directions (6,5,4,3,2,1)         . omeprazole (PRILOSEC) 20 MG capsule   Oral   Take 20 mg by mouth daily.           Marland Kitchen oxyCODONE-acetaminophen (PERCOCET)  10-325 MG per tablet   Oral   Take 1 tablet by mouth 4 (four) times daily.           Marland Kitchen rOPINIRole (REQUIP) 1 MG tablet   Oral   Take 1 mg by mouth at bedtime.          . simvastatin (ZOCOR) 20 MG tablet   Oral   Take 20 mg by mouth at bedtime.           Marland Kitchen UNKNOWN TO PATIENT   Topical   Apply 1 application topically daily as needed (for irritation (vaginal area) COMPOUND MEDICATION: Ketoconazole mixed with another medication  (name is unknown)).          BP 158/105  Pulse 89  Temp(Src) 97.7 F (36.5 C) (Oral)  Resp 22  Ht 5\' 8"  (1.727 m)  Wt 375 lb (170.099 kg)  BMI 57.03 kg/m2  SpO2 99% Physical Exam  Nursing note and vitals reviewed. Constitutional: She is oriented to person, place, and time. No distress.  Morbidly obese  HENT:  Head: Normocephalic.  Eyes: EOM are normal.  Neck: Neck supple.  Cardiovascular: Normal rate.   Pulmonary/Chest: Effort normal.  Musculoskeletal:       Left lower leg: She exhibits tenderness and swelling. She exhibits no deformity.       Legs: Ecchymosis noted lower left leg and medial aspect of left foot. Pedal pulses equal, adequate circulation, good touch sensation.  Neurological: She is alert and oriented to person, place, and time. She has normal strength. No cranial nerve deficit or sensory deficit.  Skin: Skin is warm and dry.  Psychiatric: She has a normal mood and affect. Her behavior is normal.    ED Course  Procedures (including critical care time) Labs Reviewed - No data to display Dg Tibia/fibula Left  02/11/2013   *RADIOLOGY REPORT*  Clinical Data: Left ankle and midshaft lower leg pain and swelling; bruising status post fall.  LEFT TIBIA AND FIBULA - 2 VIEW  Comparison: None.  Findings: There is no evidence of fracture or dislocation.  The tibia and fibula appear intact.  The ankle mortise is incompletely assessed, but appears grossly unremarkable.  The knee joint is incompletely assessed, but is also grossly normal in  appearance.  Plantar and posterior calcaneal spurs are seen.  Known soft tissue swelling is not well characterized on radiograph.  IMPRESSION: No evidence of fracture or dislocation.   Original Report Authenticated By: Santa Lighter, M.D.    MDM: Dr. Christy Gentles in to examine the patient.  47 y.o. female with contusion and ecchymosis to left lower leg and foot.  I have reviewed this patient's vital signs, nurses notes, appropriate imaging and discussed findings and plan of care with the patient. No signs of compartment syndrome at this time. Discussed with the patient signs and symptoms to look for. She voices understanding. She will stay off the leg as much as possible, elevate and ice. She will continue her pain management plan and follow up with Dr. Luan Pulling. She will return here if symptoms worsen.    [facility] Bunnie Pion, [place] 02/12/13 [phone]

## 2013-02-12 NOTE — ED Provider Notes (Signed)
Medical screening examination/treatment/procedure(s) were conducted as a shared visit with non-physician practitioner(s) and myself.  I personally evaluated the patient during the encounter  Pt without signs of compartment syndrome.  Distal pulses intact.  She had negative xray of LE.  Stable for d/c.  We discussed strict return precautions  Sharyon Cable, MD 02/12/13 [id number]

## 2013-02-20 ENCOUNTER — Emergency Department (HOSPITAL_COMMUNITY)
Admission: EM | Admit: 2013-02-20 | Discharge: 2013-02-21 | Disposition: A | Discharge status: Home / Self Care | Payer: Medicaid Other | Attending: Emergency Medicine | Admitting: Emergency Medicine

## 2013-02-20 ENCOUNTER — Encounter (HOSPITAL_COMMUNITY): Payer: Self-pay | Admitting: *Deleted

## 2013-02-20 DIAGNOSIS — Z8669 Personal history of other diseases of the nervous system and sense organs: Secondary | ICD-10-CM | POA: Insufficient documentation

## 2013-02-20 DIAGNOSIS — K219 Gastro-esophageal reflux disease without esophagitis: Secondary | ICD-10-CM | POA: Insufficient documentation

## 2013-02-20 DIAGNOSIS — R739 Hyperglycemia, unspecified: Secondary | ICD-10-CM

## 2013-02-20 DIAGNOSIS — I10 Essential (primary) hypertension: Secondary | ICD-10-CM | POA: Insufficient documentation

## 2013-02-20 DIAGNOSIS — Z79899 Other long term (current) drug therapy: Secondary | ICD-10-CM | POA: Insufficient documentation

## 2013-02-20 DIAGNOSIS — R0602 Shortness of breath: Secondary | ICD-10-CM | POA: Insufficient documentation

## 2013-02-20 DIAGNOSIS — Z87891 Personal history of nicotine dependence: Secondary | ICD-10-CM | POA: Insufficient documentation

## 2013-02-20 DIAGNOSIS — M7989 Other specified soft tissue disorders: Secondary | ICD-10-CM

## 2013-02-20 DIAGNOSIS — M79605 Pain in left leg: Secondary | ICD-10-CM

## 2013-02-20 DIAGNOSIS — S8012XD Contusion of left lower leg, subsequent encounter: Secondary | ICD-10-CM

## 2013-02-20 DIAGNOSIS — E1169 Type 2 diabetes mellitus with other specified complication: Secondary | ICD-10-CM | POA: Insufficient documentation

## 2013-02-20 DIAGNOSIS — L03116 Cellulitis of left lower limb: Secondary | ICD-10-CM

## 2013-02-20 DIAGNOSIS — F3289 Other specified depressive episodes: Secondary | ICD-10-CM | POA: Insufficient documentation

## 2013-02-20 DIAGNOSIS — L02419 Cutaneous abscess of limb, unspecified: Secondary | ICD-10-CM | POA: Insufficient documentation

## 2013-02-20 DIAGNOSIS — F329 Major depressive disorder, single episode, unspecified: Secondary | ICD-10-CM | POA: Insufficient documentation

## 2013-02-20 DIAGNOSIS — R7989 Other specified abnormal findings of blood chemistry: Secondary | ICD-10-CM

## 2013-02-20 LAB — CBC WITH DIFFERENTIAL/PLATELET
Basophils Absolute: 0.1 10*3/uL (ref 0.0–0.1)
Basophils Relative: 1 % (ref 0–1)
Eosinophils Absolute: 0.1 10*3/uL (ref 0.0–0.7)
Eosinophils Relative: 2 % (ref 0–5)
HCT: 40.2 % (ref 36.0–46.0)
MCH: 29.8 pg (ref 26.0–34.0)
MCHC: 34.6 g/dL (ref 30.0–36.0)
MCV: 86.3 fL (ref 78.0–100.0)
Monocytes Absolute: 0.5 10*3/uL (ref 0.1–1.0)
Platelets: 223 10*3/uL (ref 150–400)
RDW: 13.7 % (ref 11.5–15.5)
WBC: 7.8 10*3/uL (ref 4.0–10.5)

## 2013-02-20 LAB — BASIC METABOLIC PANEL
CO2: 26 mEq/L (ref 19–32)
Calcium: 9.4 mg/dL (ref 8.4–10.5)
Creatinine, Ser: 1.15 mg/dL — ABNORMAL HIGH (ref 0.50–1.10)
GFR calc Af Amer: 66 mL/min — ABNORMAL LOW (ref >90)
GFR calc non Af Amer: 57 mL/min — ABNORMAL LOW (ref >90)
Sodium: 132 mEq/L — ABNORMAL LOW (ref 135–145)

## 2013-02-20 LAB — GLUCOSE, CAPILLARY: Glucose-Capillary: 355 mg/dL — ABNORMAL HIGH (ref 70–99)

## 2013-02-20 LAB — D-DIMER, QUANTITATIVE: D-Dimer, Quant: 0.53 ug/mL-FEU — ABNORMAL HIGH (ref 0.00–0.48)

## 2013-02-20 MED ORDER — SULFAMETHOXAZOLE-TMP DS 800-160 MG PO TABS
1.0000 | ORAL_TABLET | Freq: Once | ORAL | Status: AC
  Administered 2013-02-20: 1 via ORAL
  Filled 2013-02-20: qty 1

## 2013-02-20 MED ORDER — INSULIN ASPART 100 UNIT/ML ~~LOC~~ SOLN
6.0000 [IU] | Freq: Once | SUBCUTANEOUS | Status: AC
  Administered 2013-02-20: 5 [IU] via SUBCUTANEOUS

## 2013-02-20 MED ORDER — ENOXAPARIN SODIUM 100 MG/ML ~~LOC~~ SOLN: 1.5000 mg/kg | Freq: Once | SUBCUTANEOUS | Status: DC

## 2013-02-20 MED ORDER — SULFAMETHOXAZOLE-TRIMETHOPRIM 800-160 MG PO TABS: 1.0000 | ORAL_TABLET | Freq: Two times a day (BID) | ORAL | Status: DC

## 2013-02-20 MED ORDER — ENOXAPARIN SODIUM 100 MG/ML ~~LOC~~ SOLN
1.5000 mg/kg | Freq: Once | SUBCUTANEOUS | Status: AC
  Administered 2013-02-20: 240 mg via SUBCUTANEOUS
  Filled 2013-02-20: qty 3

## 2013-02-20 NOTE — ED Provider Notes (Signed)
CSN: [id number]     Arrival date & time 02/20/13  2115 History  This chart was scribed for [name], MD by Elby Beck, ED Scribe. This patient was seen in room APA10/APA10 and the patient's care was started at 9:58 PM.   First MD Initiated Contact with Patient 02/20/13 2137     Chief Complaint  Patient presents with  . Leg Swelling  . Joint Swelling    The history is provided by the patient. No language interpreter was used.   HPI Comments: AZRIELA FIELDEN is a 47 y.o. female with a history of DM, HTN, RLS and Bipolar 1 disorder who presents to the Emergency Department complaining of 2 weeks of gradual onset, gradually worsening left lower leg pain, swelling bruising after an accidental fall, with an onset of redness and tactile fever to the area 3 days ago that is gradually worsening. She states that she was trying to get into the backseat of a truck and accidentally fell 2 weeks ago. She denies history of prior injuries, infections or blood clots in her legs. She states that she has seen her PCP. Dr. Luan Pulling about this, and she was also seen in the ED on 02/11/13 for this. She was seen in the ED when she hit her leg again on 7/18 and had an xray of her leg done. She states that she was given prednisone by Dr Luan Pulling which helped, but she states that she ran out. Pt is a former smoker of 1.5 packs/day of 25 years, who quit in 2009. She states that she does not work, and that she is on disability, which she believes is due to obstructive sleep apnea. She states that she is SOB at baseline. She denies chest pain, swelling in her right leg, fever, chills or any other symptoms.  PCP- Dr. Sinda Du Orthopedist- None   Past Medical History  Diagnosis Date  . Diabetes mellitus   . Hypertension   . RLS (restless legs syndrome)   . Acid reflux   . OSA (obstructive sleep apnea)   . Bipolar 1 disorder   . Depression    Past Surgical History  Procedure Laterality Date  . Tubal ligation     . Endometrial ablation     Family History  Problem Relation Age of Onset  . Adopted: Yes   History  Substance Use Topics  . Smoking status: Former Smoker -- 1.50 packs/day for 25 years    Types: Cigarettes    Quit date: 10/23/2007  . Smokeless tobacco: Never Used  . Alcohol Use: No  lives at home Lives with spouse Uses CPAP at night  OB History   Grav Para Term Preterm Abortions TAB SAB Ect Mult Living   2 2 1 1      2      Review of Systems  Constitutional: Negative for fever and chills.  Respiratory: Positive for shortness of breath (baseline).   Cardiovascular: Positive for leg swelling (left). Negative for chest pain.  Skin: Negative for rash.  All other systems reviewed and are negative.    Allergies  Codeine  Home Medications   Current Outpatient Rx  Name  Route  Sig  Dispense  Refill  . albuterol (PROAIR HFA) 108 (90 BASE) MCG/ACT inhaler   Inhalation   Inhale 2 puffs into the lungs every 6 (six) hours as needed for wheezing or shortness of breath.         Marland Kitchen albuterol (PROVENTIL) (2.5 MG/3ML) 0.083% nebulizer solution  Nebulization   Take 2.5 mg by nebulization every 6 (six) hours as needed for wheezing or shortness of breath.         . ALPRAZolam (XANAX) 1 MG tablet   Oral   Take 1 mg by mouth 4 (four) times daily.           Marland Kitchen amLODipine (NORVASC) 10 MG tablet   Oral   Take 10 mg by mouth daily.         . Canagliflozin (INVOKANA) 300 MG TABS   Oral   Take 300 mg by mouth daily.         . Liraglutide (VICTOZA) 18 MG/3ML SOPN   Subcutaneous   Inject 1.8 mLs into the skin daily.         Marland Kitchen lisinopril-hydrochlorothiazide (PRINZIDE,ZESTORETIC) 20-12.5 MG per tablet   Oral   Take 1 tablet by mouth 2 (two) times daily.          Marland Kitchen omeprazole (PRILOSEC) 20 MG capsule   Oral   Take 20 mg by mouth daily.           Marland Kitchen oxyCODONE-acetaminophen (PERCOCET) 10-325 MG per tablet   Oral   Take 1 tablet by mouth 4 (four) times daily.            Marland Kitchen rOPINIRole (REQUIP) 1 MG tablet   Oral   Take 1 mg by mouth at bedtime.          . simvastatin (ZOCOR) 20 MG tablet   Oral   Take 20 mg by mouth at bedtime.           Marland Kitchen UNKNOWN TO PATIENT   Topical   Apply 1 application topically daily as needed (for irritation (vaginal area) COMPOUND MEDICATION: Ketoconazole mixed with another medication  (name is unknown)).         . methylPREDNISolone (MEDROL DOSEPAK) 4 MG tablet   Oral   Take by mouth as directed. follow package directions (6,5,4,3,2,1)          Triage Vitals: BP 160/104  Pulse 90  Temp(Src) 98.1 F (36.7 C) (Oral)  Resp 20  Ht 5\' 8"  (1.727 m)  Wt 375 lb (170.099 kg)  BMI 57.03 kg/m2  SpO2 100%  Physical Exam  Nursing note and vitals reviewed. Constitutional: She is oriented to person, place, and time. She appears well-developed and well-nourished.  Non-toxic appearance. She does not appear ill. No distress.  HENT:  Head: Normocephalic and atraumatic.  Right Ear: External ear normal.  Left Ear: External ear normal.  Nose: Nose normal. No mucosal edema or rhinorrhea.  Mouth/Throat: Oropharynx is clear and moist and mucous membranes are normal. No dental abscesses or edematous.  Eyes: Conjunctivae and EOM are normal. Pupils are equal, round, and reactive to light.  Neck: Normal range of motion and full passive range of motion without pain. Neck supple.  Cardiovascular: Normal rate, regular rhythm and normal heart sounds.  Exam reveals no gallop and no friction rub.   No murmur heard. Pulmonary/Chest: Effort normal and breath sounds normal. No respiratory distress. She has no wheezes. She has no rhonchi. She has no rales. She exhibits no tenderness and no crepitus.  Abdominal: Soft. Normal appearance and bowel sounds are normal. She exhibits no distension. There is no tenderness. There is no rebound and no guarding.  Musculoskeletal: Normal range of motion. She exhibits tenderness. She exhibits no edema.   Moves all extremities well.  Diffuse swelling of her left leg. Bruising and swelling of  the dorsum of her left foot laterally with bruising and swelling distal to the medial malleolus. Linear bruising on the medial aspect of left calf. Bruising of the proximal lateral lower leg. 2.5 cm area of increased redness and tenderness on the proximal left lower leg just inferior to the lateral knee, that seems to be the area of most pain.  Neurological: She is alert and oriented to person, place, and time. She has normal strength. No cranial nerve deficit.  Skin: Skin is warm, dry and intact. No rash noted. No erythema. No pallor.  Psychiatric: She has a normal mood and affect. Her speech is normal and behavior is normal. Her mood appears not anxious.    ED Course   Medications  insulin aspart (novoLOG) injection 6 Units (not administered)  sulfamethoxazole-trimethoprim (BACTRIM DS) 800-160 MG per tablet 1 tablet (not administered)  enoxaparin (LOVENOX) injection 240 mg (not administered)    Procedures (including critical care time)  DIAGNOSTIC STUDIES: Oxygen Saturation is 100% on RA, normal by my interpretation.    COORDINATION OF CARE: 10:05 PM- Pt advised of the suspicion that she may have of cellulitis. Pt advised of plan for diagnostic lab work and pt agrees.  Pt agreeable to coming back tomorrow to get an outpatient doppler US of her leg. She was given Lovenox one and a half milligrams per KG which will last 24 hours to give her time to get her outpatient study done.  Review of the [place] controlled substance site shows patient has been getting #120 hydrocodone 10/325 monthly from Dr. Luan Pulling, not the oxycodone but she told the pharmacy tech.  She was given subcutaneous insulin in the ED for her hyperglycemia.  Results for orders placed during the hospital encounter of 02/20/13  CBC WITH DIFFERENTIAL      Result Value Range   WBC 7.8  4.0 - 10.5 K/uL   RBC 4.66  3.87 - 5.11  MIL/uL   Hemoglobin 13.9  12.0 - 15.0 g/dL   HCT 40.2  36.0 - 46.0 %   MCV 86.3  78.0 - 100.0 fL   MCH 29.8  26.0 - 34.0 pg   MCHC 34.6  30.0 - 36.0 g/dL   RDW 13.7  11.5 - 15.5 %   Platelets 223  150 - 400 K/uL   Neutrophils Relative % 47  43 - 77 %   Neutro Abs 3.7  1.7 - 7.7 K/uL   Lymphocytes Relative 44  12 - 46 %   Lymphs Abs 3.4  0.7 - 4.0 K/uL   Monocytes Relative 6  3 - 12 %   Monocytes Absolute 0.5  0.1 - 1.0 K/uL   Eosinophils Relative 2  0 - 5 %   Eosinophils Absolute 0.1  0.0 - 0.7 K/uL   Basophils Relative 1  0 - 1 %   Basophils Absolute 0.1  0.0 - 0.1 K/uL  BASIC METABOLIC PANEL      Result Value Range   Sodium 132 (*) 135 - 145 mEq/L   Potassium 4.0  3.5 - 5.1 mEq/L   Chloride 96  96 - 112 mEq/L   CO2 26  19 - 32 mEq/L   Glucose, Bld 407 (*) 70 - 99 mg/dL   BUN 16  6 - 23 mg/dL   Creatinine, Ser 1.15 (*) 0.50 - 1.10 mg/dL   Calcium 9.4  8.4 - 10.5 mg/dL   GFR calc non Af Amer 57 (*) >90 mL/min   GFR calc Af Amer 66 (*) >  90 mL/min  D-DIMER, QUANTITATIVE      Result Value Range   D-Dimer, Quant 0.53 (*) 0.00 - 0.48 ug/mL-FEU   Laboratory interpretation all normal except hyperglycemia, mildly elevated d-dimer, renal insufficiency   Dg Tibia/fibula Left 02/11/2013  IMPRESSION: No evidence of fracture or dislocation.   Original Report Authenticated By: Santa Lighter, M.D.     1. Hyperglycemia   2. Swelling of left lower extremity   3. Superficial bruising of lower leg, left, subsequent encounter   4. Cellulitis of left lower extremity    New Prescriptions   SULFAMETHOXAZOLE-TRIMETHOPRIM (SEPTRA DS) 800-160 MG PER TABLET    Take 1 tablet by mouth 2 (two) times daily.    Plan discharge   Rolland Porter, MD, FACEP   MDM   I personally performed the services described in this documentation, which was scribed in my presence. The recorded information has been reviewed and considered.  Rolland Porter, MD, Abram Sander    [name], MD 02/20/13 [phone]

## 2013-02-20 NOTE — ED Notes (Addendum)
Pt fell out of a truck last week. Pt states for the last 3 days her left leg is red, warm,  and swollen and her ankle is swollen as well.

## 2013-02-21 ENCOUNTER — Ambulatory Visit (HOSPITAL_COMMUNITY)
Admit: 2013-02-21 | Discharge: 2013-02-21 | Disposition: A | Discharge status: Home / Self Care | Payer: Medicaid Other | Attending: Emergency Medicine | Admitting: Emergency Medicine

## 2013-02-21 DIAGNOSIS — M7989 Other specified soft tissue disorders: Secondary | ICD-10-CM | POA: Insufficient documentation

## 2013-02-21 DIAGNOSIS — R7989 Other specified abnormal findings of blood chemistry: Secondary | ICD-10-CM

## 2013-02-21 DIAGNOSIS — M79605 Pain in left leg: Secondary | ICD-10-CM

## 2013-02-21 DIAGNOSIS — M79609 Pain in unspecified limb: Secondary | ICD-10-CM | POA: Insufficient documentation

## 2013-03-14 ENCOUNTER — Emergency Department (HOSPITAL_COMMUNITY): Payer: Medicaid Other

## 2013-03-14 ENCOUNTER — Encounter (HOSPITAL_COMMUNITY): Payer: Self-pay | Admitting: Emergency Medicine

## 2013-03-14 ENCOUNTER — Emergency Department (HOSPITAL_COMMUNITY)
Admission: EM | Admit: 2013-03-14 | Discharge: 2013-03-14 | Disposition: A | Discharge status: Home / Self Care | Payer: Medicaid Other | Attending: Emergency Medicine | Admitting: Emergency Medicine

## 2013-03-14 DIAGNOSIS — Z8669 Personal history of other diseases of the nervous system and sense organs: Secondary | ICD-10-CM | POA: Insufficient documentation

## 2013-03-14 DIAGNOSIS — R350 Frequency of micturition: Secondary | ICD-10-CM | POA: Insufficient documentation

## 2013-03-14 DIAGNOSIS — F17200 Nicotine dependence, unspecified, uncomplicated: Secondary | ICD-10-CM | POA: Insufficient documentation

## 2013-03-14 DIAGNOSIS — E119 Type 2 diabetes mellitus without complications: Secondary | ICD-10-CM | POA: Insufficient documentation

## 2013-03-14 DIAGNOSIS — M549 Dorsalgia, unspecified: Secondary | ICD-10-CM | POA: Insufficient documentation

## 2013-03-14 DIAGNOSIS — R52 Pain, unspecified: Secondary | ICD-10-CM | POA: Insufficient documentation

## 2013-03-14 DIAGNOSIS — Z9851 Tubal ligation status: Secondary | ICD-10-CM | POA: Insufficient documentation

## 2013-03-14 DIAGNOSIS — R109 Unspecified abdominal pain: Secondary | ICD-10-CM

## 2013-03-14 DIAGNOSIS — K219 Gastro-esophageal reflux disease without esophagitis: Secondary | ICD-10-CM | POA: Insufficient documentation

## 2013-03-14 DIAGNOSIS — R51 Headache: Secondary | ICD-10-CM | POA: Insufficient documentation

## 2013-03-14 DIAGNOSIS — R110 Nausea: Secondary | ICD-10-CM | POA: Insufficient documentation

## 2013-03-14 DIAGNOSIS — R197 Diarrhea, unspecified: Secondary | ICD-10-CM

## 2013-03-14 DIAGNOSIS — Z794 Long term (current) use of insulin: Secondary | ICD-10-CM | POA: Insufficient documentation

## 2013-03-14 DIAGNOSIS — F329 Major depressive disorder, single episode, unspecified: Secondary | ICD-10-CM | POA: Insufficient documentation

## 2013-03-14 DIAGNOSIS — Z79899 Other long term (current) drug therapy: Secondary | ICD-10-CM | POA: Insufficient documentation

## 2013-03-14 DIAGNOSIS — F3289 Other specified depressive episodes: Secondary | ICD-10-CM | POA: Insufficient documentation

## 2013-03-14 DIAGNOSIS — Z87891 Personal history of nicotine dependence: Secondary | ICD-10-CM | POA: Insufficient documentation

## 2013-03-14 DIAGNOSIS — I10 Essential (primary) hypertension: Secondary | ICD-10-CM | POA: Insufficient documentation

## 2013-03-14 DIAGNOSIS — Z3202 Encounter for pregnancy test, result negative: Secondary | ICD-10-CM | POA: Insufficient documentation

## 2013-03-14 LAB — CBC WITH DIFFERENTIAL/PLATELET
Basophils Absolute: 0 10*3/uL (ref 0.0–0.1)
Basophils Relative: 0 % (ref 0–1)
Eosinophils Absolute: 0.2 10*3/uL (ref 0.0–0.7)
Hemoglobin: 11.7 g/dL — ABNORMAL LOW (ref 12.0–15.0)
MCH: 29 pg (ref 26.0–34.0)
MCHC: 33.4 g/dL (ref 30.0–36.0)
Monocytes Relative: 7 % (ref 3–12)
Neutrophils Relative %: 62 % (ref 43–77)
RDW: 14.3 % (ref 11.5–15.5)

## 2013-03-14 LAB — URINALYSIS, ROUTINE W REFLEX MICROSCOPIC
Bilirubin Urine: NEGATIVE
Nitrite: NEGATIVE
Protein, ur: >300 mg/dL — AB
Urobilinogen, UA: 0.2 mg/dL (ref 0.0–1.0)

## 2013-03-14 LAB — COMPREHENSIVE METABOLIC PANEL
ALT: 12 U/L (ref 0–35)
Albumin: 2.2 g/dL — ABNORMAL LOW (ref 3.5–5.2)
Alkaline Phosphatase: 96 U/L (ref 39–117)
Potassium: 3.5 mEq/L (ref 3.5–5.1)
Sodium: 137 mEq/L (ref 135–145)
Total Protein: 6.8 g/dL (ref 6.0–8.3)

## 2013-03-14 LAB — URINE MICROSCOPIC-ADD ON

## 2013-03-14 MED ORDER — IOHEXOL 300 MG/ML  SOLN
50.0000 mL | Freq: Once | INTRAMUSCULAR | Status: AC | PRN
  Administered 2013-03-14: 50 mL via ORAL

## 2013-03-14 MED ORDER — IOHEXOL 300 MG/ML  SOLN: 50.0000 mL | Freq: Once | INTRAMUSCULAR | Status: DC | PRN

## 2013-03-14 MED ORDER — OXYCODONE-ACETAMINOPHEN 5-325 MG PO TABS
1.0000 | ORAL_TABLET | Freq: Once | ORAL | Status: AC
  Administered 2013-03-14: 1 via ORAL
  Filled 2013-03-14: qty 1

## 2013-03-14 MED ORDER — SODIUM CHLORIDE 0.9 % IV SOLN
INTRAVENOUS | Status: DC
  Administered 2013-03-14: 14:00:00 via INTRAVENOUS

## 2013-03-14 MED ORDER — IOHEXOL 300 MG/ML  SOLN
125.0000 mL | Freq: Once | INTRAMUSCULAR | Status: AC | PRN
  Administered 2013-03-14: 125 mL via INTRAVENOUS

## 2013-03-14 MED ORDER — OXYCODONE-ACETAMINOPHEN 5-325 MG PO TABS: 1.0000 | ORAL_TABLET | ORAL | Status: DC | PRN

## 2013-03-14 MED ORDER — SODIUM CHLORIDE 0.9 % IV BOLUS (SEPSIS)
1000.0000 mL | Freq: Once | INTRAVENOUS | Status: AC
  Administered 2013-03-14: 1000 mL via INTRAVENOUS

## 2013-03-14 NOTE — ED Notes (Signed)
Pt states she is hurting across her belly. States she thinks she may have pulled a muscle. Also, complain of a headache and diarrhea

## 2013-03-14 NOTE — ED Provider Notes (Signed)
CSN: [id number]     Arrival date & time 03/14/13  1130 History  This chart was scribed for Richarda Blade, MD by Ludger Nutting, ED Scribe. This patient was seen in room APA04/APA04 and the patient's care was started 11:56 AM.    Chief Complaint  Patient presents with  . Abdominal Pain  . Headache  . Nausea  . Diarrhea    The history is provided by the patient. No language interpreter was used.    HPI Comments: Martha [name] is a 47 y.o. female who presents to the Emergency Department complaining of ongoing, intermittent, unchanged, generalizeed abdominal pain that started about 1 week ago. Pt states she has associated diarrhea; 2 episodes today and 3-4 episodes yesterday and the day before. She reports having urinary frequency and back pain. She has used a heating pad without relief. Her LNMP was 2 years ago. She denies dysuria.   PCP Dr. Luan Pulling Past Medical History  Diagnosis Date  . Diabetes mellitus   . Hypertension   . RLS (restless legs syndrome)   . Acid reflux   . OSA (obstructive sleep apnea)   . Bipolar 1 disorder   . Depression    Past Surgical History  Procedure Laterality Date  . Tubal ligation    . Endometrial ablation     Family History  Problem Relation Age of Onset  . Adopted: Yes   History  Substance Use Topics  . Smoking status: Former Smoker -- 1.50 packs/day for 25 years    Types: Cigarettes    Quit date: 10/23/2007  . Smokeless tobacco: Never Used  . Alcohol Use: No   OB History   Grav Para Term Preterm Abortions TAB SAB Ect Mult Living   2 2 1 1      2      Review of Systems  Gastrointestinal: Positive for nausea, abdominal pain and diarrhea. Negative for vomiting.  Genitourinary: Positive for frequency. Negative for dysuria.  Musculoskeletal: Positive for back pain.  All other systems reviewed and are negative.    Allergies  Codeine  Home Medications   Current Outpatient Rx  Name  Route  Sig  Dispense  Refill  . albuterol  (PROAIR HFA) 108 (90 BASE) MCG/ACT inhaler   Inhalation   Inhale 2 puffs into the lungs every 6 (six) hours as needed for wheezing or shortness of breath.         Marland Kitchen albuterol (PROVENTIL) (2.5 MG/3ML) 0.083% nebulizer solution   Nebulization   Take 2.5 mg by nebulization every 6 (six) hours as needed for wheezing or shortness of breath.         . ALPRAZolam (XANAX) 1 MG tablet   Oral   Take 1 mg by mouth 4 (four) times daily.           Marland Kitchen amLODipine (NORVASC) 10 MG tablet   Oral   Take 10 mg by mouth daily.         . Canagliflozin (INVOKANA) 300 MG TABS   Oral   Take 300 mg by mouth daily.         . insulin aspart (NOVOLOG) 100 UNIT/ML injection   Subcutaneous   Inject 10-20 Units into the skin 3 (three) times daily with meals.         . insulin glargine (LANTUS) 100 UNIT/ML injection   Subcutaneous   Inject 80 Units into the skin at bedtime.         Marland Kitchen lisinopril-hydrochlorothiazide (PRINZIDE,ZESTORETIC) 20-12.5 MG per  tablet   Oral   Take 1 tablet by mouth 2 (two) times daily.          . Multiple Vitamin (MULTIVITAMIN WITH MINERALS) TABS tablet   Oral   Take 1 tablet by mouth daily.         Marland Kitchen omeprazole (PRILOSEC) 20 MG capsule   Oral   Take 20 mg by mouth daily.           Marland Kitchen oxyCODONE-acetaminophen (PERCOCET) 10-325 MG per tablet   Oral   Take 1 tablet by mouth 4 (four) times daily.           Marland Kitchen rOPINIRole (REQUIP) 1 MG tablet   Oral   Take 1 mg by mouth at bedtime.          . simvastatin (ZOCOR) 20 MG tablet   Oral   Take 20 mg by mouth at bedtime.           Marland Kitchen UNKNOWN TO PATIENT   Topical   Apply 1 application topically daily as needed (for irritation (vaginal area) COMPOUND MEDICATION: Ketoconazole mixed with another medication  (name is unknown)).         Marland Kitchen oxyCODONE-acetaminophen (PERCOCET) 5-325 MG per tablet   Oral   Take 1 tablet by mouth every 4 (four) hours as needed for pain.   20 tablet   0    BP 173/97  Pulse 89   Temp(Src) 98.1 F (36.7 C) (Oral)  Resp 18  Ht 5\' 8"  (1.727 m)  Wt 372 lb (168.738 kg)  BMI 56.58 kg/m2  SpO2 97% Physical Exam  Nursing note and vitals reviewed. Constitutional: She is oriented to person, place, and time. She appears well-developed and well-nourished.  Obese.   HENT:  Head: Normocephalic and atraumatic.  Eyes: Conjunctivae and EOM are normal. Pupils are equal, round, and reactive to light.  Neck: Normal range of motion and phonation normal. Neck supple.  Cardiovascular: Normal rate, regular rhythm and intact distal pulses.   Pulmonary/Chest: Effort normal and breath sounds normal. She exhibits no tenderness.  Abdominal: Soft. She exhibits no distension. There is tenderness. There is no guarding.  Diffusely tender over abdomen.   Musculoskeletal: Normal range of motion.  Neurological: She is alert and oriented to person, place, and time. She has normal strength. She exhibits normal muscle tone.  Skin: Skin is warm and dry.  Psychiatric: She has a normal mood and affect. Her behavior is normal. Judgment and thought content normal.    ED Course   Procedures (including critical care time)  DIAGNOSTIC STUDIES: Oxygen Saturation is 97% on RA, normal by my interpretation.    COORDINATION OF CARE: 12:27 PM Discussed treatment plan with pt at bedside and pt agreed to plan.   Labs Reviewed  CBC WITH DIFFERENTIAL - Abnormal; Notable for the following:    Hemoglobin 11.7 (*)    HCT 35.0 (*)    All other components within normal limits  URINALYSIS, ROUTINE W REFLEX MICROSCOPIC - Abnormal; Notable for the following:    Glucose, UA >1000 (*)    Hgb urine dipstick SMALL (*)    Protein, ur >300 (*)    All other components within normal limits  COMPREHENSIVE METABOLIC PANEL - Abnormal; Notable for the following:    Glucose, Bld 309 (*)    Albumin 2.2 (*)    GFR calc non Af Amer 71 (*)    GFR calc Af Amer 82 (*)    All other components within normal limits  URINE  MICROSCOPIC-ADD ON - Abnormal; Notable for the following:    Squamous Epithelial / LPF MANY (*)    Bacteria, UA MANY (*)    Casts GRANULAR CAST (*)    All other components within normal limits  OCCULT BLOOD, POC DEVICE - Abnormal; Notable for the following:    Fecal Occult Bld POSITIVE (*)    All other components within normal limits  URINE CULTURE  STOOL CULTURE  OVA AND PARASITE EXAMINATION  PREGNANCY, URINE   Ct Abdomen Pelvis W Contrast  03/14/2013   *RADIOLOGY REPORT*  Clinical Data: Abdominal pain.  CT ABDOMEN AND PELVIS WITH CONTRAST  Technique:  Multidetector CT imaging of the abdomen and pelvis was performed following the standard protocol during bolus administration of intravenous contrast.  Contrast: 124mL OMNIPAQUE IOHEXOL 300 MG/ML  SOLN  Comparison: CT abdomen and pelvis 04/16/2010 and chest two views abdomen 01/19/2011.  Findings: Minimal atelectasis is seen in the lung bases.  The lung bases are otherwise clear.  No pleural or pericardial effusion.  As on the prior study, a 2.2 cm stone is identified within the gallbladder without evidence of cholecystitis.  The liver is low attenuating consistent with fatty infiltration.  No focal liver lesion.  The adrenal glands, spleen, pancreas and right kidney appear normal.  Small left renal cyst is identified.  Uterus, adnexa and urinary bladder are unremarkable.  The stomach, small and large bowel and appendix appear normal.  No focal bony abnormality is identified.  IMPRESSION:  1.  No acute finding or finding to explain the patient's symptoms. 2.  Fatty infiltration of the liver. 3.  2.2 cm gallstone without evidence of cholecystitis.   Original Report Authenticated By: Orlean Patten, M.D.   1. Abdominal pain, acute   2. Diarrhea     MDM  Nonspecific Abdominal Pain with Diarrhea, no evident colitis on CT. Pt improved with ED treatment. Doubt metabolic instability, serious bacterial infection or impending vascular collapse; the  patient is stable for discharge.   Nursing Notes Reviewed/ Care Coordinated Applicable Imaging Reviewed Interpretation of Laboratory Data incorporated into ED treatment  Plan: Home Medications- Percocet; Home Treatments- increase fiber, watch for progressive sx; return here if the recommended treatment, does not improve the symptoms; Recommended follow up- PCP check up in 1 week  I personally performed the services described in this documentation, which was scribed in my presence. The recorded information has been reviewed and is accurate.   Richarda Blade, MD 03/14/13 2032

## 2013-03-14 NOTE — ED Notes (Signed)
States that she started having nausea, diarrhea, abdominal pain with a headache about 2-3 days ago.

## 2013-03-14 NOTE — ED Notes (Signed)
Pt want to know when the doctor is going to come back in and give her her result. She is hungry and wants to get something to eat.

## 2013-03-15 LAB — OVA AND PARASITE EXAMINATION: Ova and parasites: NONE SEEN

## 2013-03-16 LAB — URINE CULTURE

## 2013-03-18 LAB — STOOL CULTURE

## 2013-04-15 ENCOUNTER — Ambulatory Visit (INDEPENDENT_AMBULATORY_CARE_PROVIDER_SITE_OTHER): Payer: Medicaid Other | Admitting: Gastroenterology

## 2013-04-15 ENCOUNTER — Encounter: Payer: Self-pay | Admitting: Gastroenterology

## 2013-04-15 VITALS — BP 154/93 | HR 91 | Temp 97.2°F | Ht 68.0 in | Wt 358.2 lb

## 2013-04-15 DIAGNOSIS — R109 Unspecified abdominal pain: Secondary | ICD-10-CM

## 2013-04-15 DIAGNOSIS — R1010 Upper abdominal pain, unspecified: Secondary | ICD-10-CM

## 2013-04-15 DIAGNOSIS — K429 Umbilical hernia without obstruction or gangrene: Secondary | ICD-10-CM | POA: Insufficient documentation

## 2013-04-15 DIAGNOSIS — K219 Gastro-esophageal reflux disease without esophagitis: Secondary | ICD-10-CM

## 2013-04-15 DIAGNOSIS — D649 Anemia, unspecified: Secondary | ICD-10-CM | POA: Insufficient documentation

## 2013-04-15 DIAGNOSIS — K625 Hemorrhage of anus and rectum: Secondary | ICD-10-CM

## 2013-04-15 NOTE — Assessment & Plan Note (Signed)
Poorly controlled on current regimen. Patient needs significant weight loss to better manage her reflux. Currently going to [facility] for weight management. Await labs. May or may not need a repeat upper endoscopy versus trial of different PPI based on hemoglobin level. Recent heme positive stool could have been secondary to hemorrhoids or acute diarrhea. Further recommendations to follow.

## 2013-04-15 NOTE — Assessment & Plan Note (Signed)
Recent mild anemia but significant change from H&H in July. Bright red blood per rectum likely due to internal hemorrhoids. Colonoscopy up-to-date as noted. Followup on recent labs done by Dr. Luan Pulling. Further recommendations to follow. Possible candidate for hemorrhoid banding unless her morbid obesity limits evaluation.

## 2013-04-15 NOTE — Patient Instructions (Addendum)
1. We've requested a copy of recent labs from Dr. Luan Pulling. Further recommendations to follow.

## 2013-04-15 NOTE — Progress Notes (Signed)
Primary Care Physician:  Alonza Bogus, MD  Primary Gastroenterologist:  Barney Drain, MD   Chief Complaint  Patient presents with  . Rectal Bleeding    HPI:  Martha [name] is a 47 y.o. female here at the request of Dr. Luan Pulling for further evaluation of anemia, Hemoccult-positive stool. Recently she presented to the emergency department with acute onset mid-to upper crampy abdominal pain. Some nausea no vomiting. Abdominal pain had been present for several days prior to ER visit. She has CT of abdomen pelvis as below, unremarkable. Stool studies were negative. She was Hemoccult-positive. She was mildly anemic with a hemoglobin 11.7. Notably in March of 2014 at [facility] her hemoglobin was 14.3. Patient states her abdominal pain has resolved. It was her only episode. She's afraid it will return. At baseline she always has loose stools, usually a couple per day. Rare solid stool. Complains of bright red blood per rectum intermittently. Drips all over the toilet and floor. Denies melena. Complains of breakthrough heartburn on Prilosec twice a day. Has some dysphagia to solid hard foods.    Current Outpatient Prescriptions  Medication Sig Dispense Refill  . albuterol (PROAIR HFA) 108 (90 BASE) MCG/ACT inhaler Inhale 2 puffs into the lungs every 6 (six) hours as needed for wheezing or shortness of breath.      Marland Kitchen albuterol (PROVENTIL) (2.5 MG/3ML) 0.083% nebulizer solution Take 2.5 mg by nebulization every 6 (six) hours as needed for wheezing or shortness of breath.      . ALPRAZolam (XANAX) 1 MG tablet Take 1 mg by mouth 4 (four) times daily.        Marland Kitchen amLODipine (NORVASC) 10 MG tablet Take 10 mg by mouth daily.      . furosemide (LASIX) 40 MG tablet Take 40 mg by mouth.      Marland Kitchen HYDROcodone-acetaminophen (NORCO) 10-325 MG per tablet Take 1 tablet by mouth every 6 (six) hours as needed for pain.      Marland Kitchen insulin aspart (NOVOLOG) 100 UNIT/ML injection Inject 10-20 Units into the skin 3 (three) times daily  with meals.      . insulin glargine (LANTUS) 100 UNIT/ML injection Inject 80 Units into the skin at bedtime.      Marland Kitchen lisinopril-hydrochlorothiazide (PRINZIDE,ZESTORETIC) 20-12.5 MG per tablet Take 1 tablet by mouth 2 (two) times daily.       . metFORMIN (GLUCOPHAGE) 1000 MG tablet Take 1,000 mg by mouth 2 (two) times daily with a meal.      . omeprazole (PRILOSEC) 20 MG capsule Take 20 mg by mouth 2 (two) times daily.       Marland Kitchen rOPINIRole (REQUIP) 1 MG tablet Take 1 mg by mouth at bedtime.       . simvastatin (ZOCOR) 20 MG tablet Take 40 mg by mouth at bedtime.       . Multiple Vitamin (MULTIVITAMIN WITH MINERALS) TABS tablet Take 1 tablet by mouth daily.      Marland Kitchen oxyCODONE-acetaminophen (PERCOCET) 10-325 MG per tablet Take 1 tablet by mouth 4 (four) times daily.        Marland Kitchen oxyCODONE-acetaminophen (PERCOCET) 5-325 MG per tablet Take 1 tablet by mouth every 4 (four) hours as needed for pain.  20 tablet  0  . UNKNOWN TO PATIENT Apply 1 application topically daily as needed (for irritation (vaginal area) COMPOUND MEDICATION: Ketoconazole mixed with another medication  (name is unknown)).       No current facility-administered medications for this visit.    Allergies as of  04/15/2013 - Review Complete 03/14/2013  Allergen Reaction Noted  . Codeine Nausea And Vomiting     Past Medical History  Diagnosis Date  . Diabetes mellitus   . Hypertension   . RLS (restless legs syndrome)   . Acid reflux   . OSA (obstructive sleep apnea)   . Bipolar 1 disorder   . Depression     Past Surgical History  Procedure Laterality Date  . Tubal ligation    . Endometrial ablation    . Colonoscopy  06/06/2010    [id number] bleeding secondary to internal hemorrhoids but incomplete evaluation secondary to poor right colon prep/small rectal and sigmoid colon polyps (hyperplastic). PROPOFOL  . Esophagogastroduodenoscopy  06/06/2010    [id number] erythema and edema of body of stomach, with sessile polypoid lesions.  bx benign. no h.pylori  . Colonoscopy  May 2013    Dr. Gilliam/[facility]: 5 mm a descending colon polyp, hyperplastic. Adequate bowel prep.    Family History  Problem Relation Age of Onset  . Adopted: Yes    History   Social History  . Marital Status: Divorced    Spouse Name: N/A    Number of Children: N/A  . Years of Education: N/A   Occupational History  . Not on file.   Social History Main Topics  . Smoking status: Former Smoker -- 1.50 packs/day for 25 years    Types: Cigarettes    Quit date: 10/23/2007  . Smokeless tobacco: Never Used  . Alcohol Use: No  . Drug Use: No  . Sexual Activity: Yes    Birth Control/ Protection: Surgical   Other Topics Concern  . Not on file   Social History Narrative  . No narrative on file      ROS:  General: Negative for anorexia, weight loss, fever, chills, fatigue, weakness. Eyes: Negative for vision changes.  ENT: Negative for hoarseness, difficulty swallowing , nasal congestion. CV: Negative for chest pain, angina, palpitations, dyspnea on exertion, peripheral edema.  Respiratory: Negative for dyspnea at rest, dyspnea on exertion, cough, sputum, wheezing.  GI: See history of present illness. GU:  Negative for dysuria, hematuria, urinary incontinence, urinary frequency, nocturnal urination.  MS: Chronic pain  Derm: Negative for rash or itching.  Neuro: Negative for weakness, abnormal sensation, seizure, frequent headaches, memory loss, confusion.  Psych: Negative for anxiety, depression, suicidal ideation, hallucinations.  Endo: Negative for unusual weight change.  Heme: Negative for bruising or bleeding. Allergy: Negative for rash or hives.    Physical Examination:  BP 154/93  Pulse 91  Temp(Src) 97.2 F (36.2 C) (Oral)  Ht 5\' 8"  (1.727 m)  Wt 358 lb 3.2 oz (162.478 kg)  BMI 54.48 kg/m2   General: Morbidly obese, well-developed in no acute distress.  Head: Normocephalic, atraumatic.   Eyes: Conjunctiva pink, no  icterus. Mouth: Oropharyngeal mucosa moist and pink , no lesions erythema or exudate. Neck: Supple without thyromegaly, masses, or lymphadenopathy.  Lungs: Clear to auscultation bilaterally.  Heart: Regular rate and rhythm, no murmurs rubs or gallops.  Abdomen: Bowel sounds are normal, nontender, nondistended, no hepatosplenomegaly or masses, no abdominal bruits or    hernia , no rebound or guarding.   Rectal: Not performed Extremities: No lower extremity edema. No clubbing or deformities.  Neuro: Alert and oriented x 4 , grossly normal neurologically.  Skin: Warm and dry, no rash or jaundice.   Psych: Alert and cooperative, normal mood and affect.  Labs: Lab Results  Component Value Date   WBC 7.2 03/14/2013  HGB 11.7* 03/14/2013   HCT 35.0* 03/14/2013   MCV 86.6 03/14/2013   PLT 227 03/14/2013   Lab Results  Component Value Date   CREATININE 0.96 03/14/2013   BUN 10 03/14/2013   NA 137 03/14/2013   K 3.5 03/14/2013   CL 100 03/14/2013   CO2 29 03/14/2013   Lab Results  Component Value Date   ALT 12 03/14/2013   AST 8 03/14/2013   ALKPHOS 96 03/14/2013   BILITOT 0.3 03/14/2013     Imaging Studies: CT abdomen pelvis with contrast on 03/14/2013   IMPRESSION:  1. No acute finding or finding to explain the patient's symptoms.  2. Fatty infiltration of the liver.  3. 2.2 cm gallstone without evidence of cholecystitis.

## 2013-04-15 NOTE — Assessment & Plan Note (Signed)
Single episode of upper abdominal pain, resolved. Patient has known gallstone therefore I've asked her to pay more attention if she's having some postprandial abdominal pain. Currently patient has been asymptomatic for several weeks.

## 2013-04-18 ENCOUNTER — Telehealth: Payer: Self-pay | Admitting: Gastroenterology

## 2013-04-18 NOTE — Telephone Encounter (Signed)
Patient is asking what her next step will be? EGD vs. Hemorrhoid Banding Please advise?

## 2013-04-18 NOTE — Telephone Encounter (Signed)
I called pt and she said she is getting worried. She wants to know what she needs to do next. She said she is having a lot of bright red blood and it drips on the floor when she uses the toilet. She said she can hardly go anywhere because it has got to be so frequent. She is feeling weak also. I told her if she is losing that much blood and also weak she should go to the ED. I am sending Dr. Oneida Alar a message and pager.

## 2013-04-18 NOTE — Progress Notes (Signed)
CC'd to PCP 

## 2013-04-19 NOTE — Telephone Encounter (Signed)
PLEASE CALL PT. SHE SHOULD START ANUSOL SUPP OR CREAM. PLEASE LET us KNOW WHICH ONE SHE PREFERS.  SHE MAY SEE ME IN THE [facility] FOR HEMORRHOID BANDING TOMORROW AT SEP 24 AT 1115, OCT 1 AT 1115 OR AT 145 PM OCT 2.

## 2013-04-19 NOTE — Telephone Encounter (Signed)
Called and LMOM for a return call.  

## 2013-04-20 ENCOUNTER — Telehealth: Payer: Self-pay | Admitting: Gastroenterology

## 2013-04-20 MED ORDER — HYDROCORTISONE ACETATE 25 MG RE SUPP: 25.0000 mg | Freq: Two times a day (BID) | RECTAL | Status: DC

## 2013-04-20 NOTE — Telephone Encounter (Signed)
PLEASE CALL PT.  Rx sent.  

## 2013-04-20 NOTE — Addendum Note (Signed)
Addended by: [name] on: 04/20/2013 09:17 AM   Modules accepted: Orders

## 2013-04-20 NOTE — Telephone Encounter (Signed)
Pt is aware Rx sent.

## 2013-04-20 NOTE — Telephone Encounter (Signed)
Pt is aware of banding on 10/2 at 145 with SF and appt card was mailed

## 2013-04-20 NOTE — Telephone Encounter (Signed)
The rx that was called in for patient her insurance will not cover it and she wants something else that will be covered. Please advise and call her at [phone]

## 2013-04-20 NOTE — Telephone Encounter (Signed)
Pt wants the Rx for suppositories sent to [facility]. Manuela Schwartz, pt wants the Oct 2 at 1:45 appt for the hemorrhoid banding).

## 2013-04-21 MED ORDER — HYDROCORTISONE 2.5 % RE CREA: TOPICAL_CREAM | Freq: Two times a day (BID) | RECTAL | Status: DC

## 2013-04-21 NOTE — Telephone Encounter (Signed)
LMOM for pt to call and let me know what is going on.

## 2013-04-21 NOTE — Telephone Encounter (Signed)
PLEASE CALL PT. She needs to ask her pharmacy what medicine will her insurance cover and call me back. I will write for it.

## 2013-04-21 NOTE — Addendum Note (Signed)
Addended by: [name] on: 04/21/2013 04:08 PM   Modules accepted: Orders

## 2013-04-21 NOTE — Telephone Encounter (Signed)
Called and spoke to April at [facility]. The only thing that would be covered is the Proctozone HCL cream.

## 2013-04-21 NOTE — Telephone Encounter (Signed)
Pt called back and said that the Anusol would cost her $38.00 and she cannot afford it. Please advise!

## 2013-04-21 NOTE — Telephone Encounter (Signed)
Called and informed pt.  

## 2013-04-21 NOTE — Telephone Encounter (Signed)
PLEASE CALL PT.  Rx sent.  

## 2013-04-26 NOTE — Progress Notes (Signed)
Called and informed pt.  

## 2013-04-26 NOTE — Progress Notes (Signed)
Received copy of labs for Dr. Luan Pulling dated 03/16/2013. White blood cell count 6700, hemoglobin 11.5, hematocrit 35.9, MCV 87.8, platelets 273,000, iron 35, TIBC 206, iron saturation 17%, ferritin 340, vitamin B12 436, folate 16.6.  She had a hemoglobin of 14.3 in March 2014 Heme positive stool recently.  Upcoming hemorrhoid banding on Thursday by Dr. Oneida Alar. Colonoscopy is up-to-date done by Dr. Arsenio Loader.  Patient likely needs upper endoscopy for further evaluation of anemia. To discuss with Dr. Oneida Alar.

## 2013-04-28 ENCOUNTER — Encounter: Payer: Self-pay | Admitting: Gastroenterology

## 2013-04-28 ENCOUNTER — Ambulatory Visit (INDEPENDENT_AMBULATORY_CARE_PROVIDER_SITE_OTHER): Payer: Medicaid Other | Admitting: Gastroenterology

## 2013-04-28 ENCOUNTER — Other Ambulatory Visit: Payer: Self-pay | Admitting: Gastroenterology

## 2013-04-28 VITALS — BP 160/98 | HR 113 | Temp 98.4°F | Ht 67.0 in | Wt 366.4 lb

## 2013-04-28 DIAGNOSIS — R1319 Other dysphagia: Secondary | ICD-10-CM

## 2013-04-28 DIAGNOSIS — K625 Hemorrhage of anus and rectum: Secondary | ICD-10-CM

## 2013-04-28 DIAGNOSIS — R1312 Dysphagia, oropharyngeal phase: Secondary | ICD-10-CM

## 2013-04-28 DIAGNOSIS — K648 Other hemorrhoids: Secondary | ICD-10-CM | POA: Insufficient documentation

## 2013-04-28 NOTE — Progress Notes (Signed)
SYMPTOMS: LARGE AMOUNT OF RECTAL BLEEDING: DAILY AFTER EVERY BM,  OCCASIONAL PAIN, ITCHING, BURNING. C/o solid dysphagia. LAST EGD/DIL NOV 2011. HAS 2 MIXED RACE GRANDCHILDREN.  CONSTIPATION: NO DIARRHEA: YES  STRAINS WITH BMs: YES  TIME SPENT ON TOILET: 10 MINS TISSUE POKES OUT OF RECTUM: YES- GOES BACK BY ITSELF FIBER SUPPLEMENTS: NO  GLASSES OF WATER/DAY: 6-8: NO   ADDITIONAL QUESTIONS:  LATEX ALLERGY: NO PREGNANT: NO ERECTILE DYSFUNCTION MEDS OR NITRATES: NO ANTICOAGULATION/ANTIPLATELET MEDS: NO DIAGNOSED WITH CROHN'S DISEASE, PROCTITIS, PORTAL HTN, OR ANAL/RECTAL CA: NO TAKING IMMUNOSUPPRESSANTS/XRT: NO   PLAN: 1. DISCUSSED MANAGEMENT OPTIONS FOR PT.  2. ANOSCOPY/[facility] BANDING TODAY FOLLOWED BY FLEX/SIG OH BANDING 3. EGD/DIL WITH PROPOFOL   PROCEDURE TECHNIQUE: BENEFITS RISK EXPLAINED TO PT. ANOSCOPY PERFORMED. BULGING INTERNAL HEMORRHOID COLUMN IN THE R POSTERIOR AND ANTERIOR COLUMNS. ONE [facility] BAND PLACED IN RIGHT POSTERIOR POSITION. POST-BANDING RECTAL EXAM REVEALED GOOD PLACEMENT. EXAM NON-TENDER.

## 2013-04-28 NOTE — Patient Instructions (Addendum)
YOU WILL BE SCHEDULE FOR A FLEX SIG WITH HEMORRHOID BANDING AND UPPER ENDOSCOPY WITH DILATION IN 2-3 WEEKS.  DRINK WATER TO KEEP YOUR URINE LIGHT YELLOW.  CONTINUE YOUR WEIGHT LOSS EFFORTS.  FOLLOW A LOW FAT/SOFT MECHANICAL DIET. SEE INFO BELOW.  MEATS SHOULD BE CHOPPED OR GROUND ONLY. DO NOT EAT CHUNKS OF ANYTHING.  CONTINUE PROCTOZONE FOR 10 DAYS.  Use Prilosec 30 minutes prior to your meals TWICE DAILY.  FOLLOW UP IN 6 MOS.   SOFT MECHANICAL DIET This SOFT MECHANICAL DIET is restricted to:  Foods that are moist, soft-textured, and easy to chew and swallow. MEATS SHOULD BE CHOPPED OR GROUND.  Meats that are ground or are minced no larger than one-quarter inch pieces. Meats are moist with gravy or sauce added.   Foods that do not include bread or bread-like textures except soft pancakes, well-moistened with syrup or sauce.   Textures with some chewing ability required.   Casseroles without rice.   Cooked vegetables that are less than half an inch in size and easily mashed with a fork. No cooked corn, peas, broccoli, cauliflower, cabbage, Brussels sprouts, asparagus, or other fibrous, non-tender or rubbery cooked vegetables.   Canned fruit except for pineapple. Fruit must be cut into pieces no larger than half an inch in size.   Foods that do not include nuts, seeds, coconut, or sticky textures.    FOOD TEXTURES FOR DYSPHAGIA DIET LEVEL 2 -SOFT MECHANICAL DIET (includes all foods on Dysphagia Diet Level 1 - Pureed, in addition to the foods listed below)  FOOD GROUP: Breads. RECOMMENDED: Soft pancakes, well-moistened with syrup or sauce. AVOID: All others.  FOOD GROUP: Cereals. RECOMMENDED: Cooked cereals with little texture, including oatmeal. Unprocessed wheat bran stirred into cereals for bulk. Note: If thin liquids are restricted, it is important that all of the liquid is absorbed into the cereal. AVOID: All dry cereals and any cooked cereals that may contain flax seeds  or other seeds or nuts. Whole-grain, dry, or coarse cereals. Cereals with nuts, seeds, dried fruit, and/or coconut.  FOOD GROUP: Desserts. RECOMMENDED: Pudding, custard. Soft fruit pies with bottom crust only. Canned fruit (excluding pineapple). Soft, moist cakes with icing.Frozen malts, milk shakes, frozen yogurt, eggnog, nutritional supplements, ice cream, sherbet, regular or sugar-free gelatin, or any foods that become thin liquid at either room (70 F) or body temperature (98 F). AVOID: Dry, coarse cakes and cookies. Anything with nuts, seeds, coconut, pineapple, or dried fruit. Breakfast yogurt with nuts. Rice or bread pudding.  FOOD GROUP: Fats. RECOMMENDED: Butter, margarine, cream for cereal (depending on liquid consistency recommendations), gravy, cream sauces, sour cream, sour cream dips with soft additives, mayonnaise, salad dressings, cream cheese, cream cheese spreads with soft additives, whipped toppings. AVOID: All fats with coarse or chunky additives.  FOOD GROUP: Fruits. RECOMMENDED: Soft drained, canned, or cooked fruits without seeds or skin. Fresh soft and ripe banana. Fruit juices with a small amount of pulp. If thin liquids are restricted, fruit juices should be thickened to appropriate consistency. AVOID: Fresh or frozen fruits. Cooked fruit with skin or seeds. Dried fruits. Fresh, canned, or cooked pineapple.  FOOD GROUP: Meats and Meat Substitutes. (Meat pieces should not exceed 1/4 of an inch cube and should be tender.) RECOMMENDED: Moistened ground or cooked meat, poultry, or fish. Moist ground or tender meat may be served with gravy or sauce. Casseroles without rice. Moist macaroni and cheese, well-cooked pasta with meat sauce, tuna noodle casserole, soft, moist lasagna. Moist meatballs, meatloaf, or  fish loaf. Protein salads, such as tuna or egg without large chunks, celery, or onion. Cottage cheese, smooth quiche without large chunks. Poached, scrambled, or  soft-cooked eggs (egg yolks should not be "runny" but should be moist and able to be mashed with butter, margarine, or other moisture added to them). (Cook eggs to 160 F or use pasteurized eggs for safety.) Souffls may have small, soft chunks. Tofu. Well-cooked, slightly mashed, moist legumes, such as baked beans. All meats or protein substitutes should be served with sauces or moistened to help maintain cohesiveness in the oral cavity. AVOID: Dry meats, tough meats (such as bacon, sausage, hot dogs, bratwurst). Dry casseroles or casseroles with rice or large chunks. Peanut butter. Cheese slices and cubes. Hard-cooked or crisp fried eggs. Sandwiches.Pizza.  FOOD GROUP: Potatoes and Starches. RECOMMENDED: Well-cooked, moistened, boiled, baked, or mashed potatoes. Well-cooked shredded hash brown potatoes that are not crisp. (All potatoes need to be moist and in sauces.)Well-cooked noodles in sauce. Spaetzel or soft dumplings that have been moistened with butter or gravy. AVOID: Potato skins and chips. Fried or French-fried potatoes. Rice.  FOOD GROUP: Soups. RECOMMENDED: Soups with easy-to-chew or easy-to-swallow meats or vegetables: Particle sizes in soups should be less than 1/2 inch. Soups will need to be thickened to appropriate consistency if soup is thinner than prescribed liquid consistency. AVOID: Soups with large chunks of meat and vegetables. Soups with rice, corn, peas.  FOOD GROUP: Vegetables. RECOMMENDED: All soft, well-cooked vegetables. Vegetables should be less than a half inch. Should be easily mashed with a fork. AVOID: Cooked corn and peas. Broccoli, cabbage, Brussels sprouts, asparagus, or other fibrous, non-tender or rubbery cooked vegetables.  FOOD GROUP: Miscellaneous. RECOMMENDED: Jams and preserves without seeds, jelly. Sauces, salsas, etc., that may have small tender chunks less than 1/2 inch. Soft, smooth chocolate bars that are easily chewed. AVOID: Seeds, nuts, coconut,  or sticky foods. Chewy candies such as caramels or licorice.      Low-Fat Diet BREADS, CEREALS, PASTA, RICE, DRIED PEAS, AND BEANS These products are high in carbohydrates and most are low in fat. Therefore, they can be increased in the diet as substitutes for fatty foods. They too, however, contain calories and should not be eaten in excess. Cereals can be eaten for snacks as well as for breakfast.   FRUITS AND VEGETABLES It is good to eat fruits and vegetables. Besides being sources of fiber, both are rich in vitamins and some minerals. They help you get the daily allowances of these nutrients. Fruits and vegetables can be used for snacks and desserts.  MEATS Limit lean meat, chicken, [place], and fish to no more than 6 ounces per day. Beef, Pork, and Lamb Use lean cuts of beef, pork, and lamb. Lean cuts include:  Extra-lean ground beef.  Arm roast.  Sirloin tip.  Center-cut ham.  Round steak.  Loin chops.  Rump roast.  Tenderloin.  Trim all fat off the outside of meats before cooking. It is not necessary to severely decrease the intake of red meat, but lean choices should be made. Lean meat is rich in protein and contains a highly absorbable form of iron. Premenopausal women, in particular, should avoid reducing lean red meat because this could increase the risk for low red blood cells (iron-deficiency anemia).  Chicken and [place] These are good sources of protein. The fat of poultry can be reduced by removing the skin and underlying fat layers before cooking. Chicken and [place] can be substituted for lean red meat  in the diet. Poultry should not be fried or covered with high-fat sauces. Fish and Shellfish Fish is a good source of protein. Shellfish contain cholesterol, but they usually are low in saturated fatty acids. The preparation of fish is important. Like chicken and [place], they should not be fried or covered with high-fat sauces. EGGS Egg whites contain no fat or  cholesterol. They can be eaten often. Try 1 to 2 egg whites instead of whole eggs in recipes or use egg substitutes that do not contain yolk. MILK AND DAIRY PRODUCTS Use skim or 1% milk instead of 2% or whole milk. Decrease whole milk, natural, and processed cheeses. Use nonfat or low-fat (2%) cottage cheese or low-fat cheeses made from vegetable oils. Choose nonfat or low-fat (1 to 2%) yogurt. Experiment with evaporated skim milk in recipes that call for heavy cream. Substitute low-fat yogurt or low-fat cottage cheese for sour cream in dips and salad dressings. Have at least 2 servings of low-fat dairy products, such as 2 glasses of skim (or 1%) milk each day to help get your daily calcium intake. FATS AND OILS Reduce the total intake of fats, especially saturated fat. Butterfat, lard, and beef fats are high in saturated fat and cholesterol. These should be avoided as much as possible. Vegetable fats do not contain cholesterol, but certain vegetable fats, such as coconut oil, palm oil, and palm kernel oil are very high in saturated fats. These should be limited. These fats are often used in bakery goods, processed foods, popcorn, oils, and nondairy creamers. Vegetable shortenings and some peanut butters contain hydrogenated oils, which are also saturated fats. Read the labels on these foods and check for saturated vegetable oils. Unsaturated vegetable oils and fats do not raise blood cholesterol. However, they should be limited because they are fats and are high in calories. Total fat should still be limited to 30% of your daily caloric intake. Desirable liquid vegetable oils are corn oil, cottonseed oil, olive oil, canola oil, safflower oil, soybean oil, and sunflower oil. Peanut oil is not as good, but small amounts are acceptable. Buy a heart-healthy tub margarine that has no partially hydrogenated oils in the ingredients. Mayonnaise and salad dressings often are made from unsaturated fats, but they should  also be limited because of their high calorie and fat content. Seeds, nuts, peanut butter, olives, and avocados are high in fat, but the fat is mainly the unsaturated type. These foods should be limited mainly to avoid excess calories and fat. OTHER EATING TIPS Snacks  Most sweets should be limited as snacks. They tend to be rich in calories and fats, and their caloric content outweighs their nutritional value. Some good choices in snacks are graham crackers, melba toast, soda crackers, bagels (no egg), English muffins, fruits, and vegetables. These snacks are preferable to snack crackers, [place] fries, TORTILLA CHIPS, and POTATO chips. Popcorn should be air-popped or cooked in small amounts of liquid vegetable oil. Desserts Eat fruit, low-fat yogurt, and fruit ices instead of pastries, cake, and cookies. Sherbet, angel food cake, gelatin dessert, frozen low-fat yogurt, or other frozen products that do not contain saturated fat (pure fruit juice bars, frozen ice pops) are also acceptable.  COOKING METHODS Choose those methods that use little or no fat. They include: Poaching.  Braising.  Steaming.  Grilling.  Baking.  Stir-frying.  Broiling.  Microwaving.  Foods can be cooked in a nonstick pan without added fat, or use a nonfat cooking spray in regular cookware. Limit fried  foods and avoid frying in saturated fat. Add moisture to lean meats by using water, broth, cooking wines, and other nonfat or low-fat sauces along with the cooking methods mentioned above. Soups and stews should be chilled after cooking. The fat that forms on top after a few hours in the refrigerator should be skimmed off. When preparing meals, avoid using excess salt. Salt can contribute to raising blood pressure in some people.  EATING AWAY FROM HOME Order entres, potatoes, and vegetables without sauces or butter. When meat exceeds the size of a deck of cards (3 to 4 ounces), the rest can be taken home for another  meal. Choose vegetable or fruit salads and ask for low-calorie salad dressings to be served on the side. Use dressings sparingly. Limit high-fat toppings, such as bacon, crumbled eggs, cheese, sunflower seeds, and olives. Ask for heart-healthy tub margarine instead of butter.

## 2013-04-28 NOTE — Assessment & Plan Note (Signed)
DYSPHAGIA GETTING WORSE TO SOLIDS  EGD/DIL WIT PROPOFOL DUE TO POLYPHARMACY SOFT MECH DIET OPV IN 6 MOS

## 2013-04-28 NOTE — Assessment & Plan Note (Signed)
COMPLICATED BY RECTAL BLEEDING.  R POS BAND TODAY. FLEX SIG/IH BANDING 2-3 WEEKS FROM TODAY OPV IN 6 MOS

## 2013-04-29 ENCOUNTER — Encounter (HOSPITAL_COMMUNITY): Payer: Self-pay | Admitting: Pharmacy Technician

## 2013-04-29 NOTE — Progress Notes (Signed)
REVIEWED.  

## 2013-05-02 LAB — CBC
%SAT: 17
HGB: 14.3 g/dL
Iron: 35
MCV: 87.8 fL
Vitamin B-12: 436
WBC: 6.7
platelet count: 273

## 2013-05-09 ENCOUNTER — Other Ambulatory Visit: Payer: Self-pay | Admitting: Gastroenterology

## 2013-05-11 ENCOUNTER — Other Ambulatory Visit: Payer: Self-pay | Admitting: Gastroenterology

## 2013-05-11 ENCOUNTER — Ambulatory Visit (HOSPITAL_COMMUNITY): Admission: RE | Admit: 2013-05-11 | Payer: Medicaid Other | Source: Ambulatory Visit | Admitting: Gastroenterology

## 2013-05-11 ENCOUNTER — Encounter (HOSPITAL_COMMUNITY): Admission: RE | Payer: Self-pay | Source: Ambulatory Visit

## 2013-05-11 SURGERY — SIGMOIDOSCOPY, FLEXIBLE
Anesthesia: Moderate Sedation

## 2013-05-12 ENCOUNTER — Encounter (HOSPITAL_COMMUNITY): Payer: Self-pay | Admitting: Pharmacy Technician

## 2013-05-13 ENCOUNTER — Encounter (HOSPITAL_COMMUNITY): Payer: Self-pay

## 2013-05-13 ENCOUNTER — Encounter (HOSPITAL_COMMUNITY)
Admission: RE | Admit: 2013-05-13 | Discharge: 2013-05-13 | Disposition: A | Discharge status: Home / Self Care | Payer: Medicaid Other | Source: Ambulatory Visit | Attending: Gastroenterology | Admitting: Gastroenterology

## 2013-05-13 ENCOUNTER — Other Ambulatory Visit: Payer: Self-pay

## 2013-05-13 DIAGNOSIS — Z01818 Encounter for other preprocedural examination: Secondary | ICD-10-CM | POA: Insufficient documentation

## 2013-05-13 DIAGNOSIS — Z01810 Encounter for preprocedural cardiovascular examination: Secondary | ICD-10-CM | POA: Insufficient documentation

## 2013-05-13 DIAGNOSIS — Z01812 Encounter for preprocedural laboratory examination: Secondary | ICD-10-CM | POA: Insufficient documentation

## 2013-05-13 HISTORY — DX: Anemia, unspecified: D64.9

## 2013-05-13 LAB — BASIC METABOLIC PANEL
BUN: 17 mg/dL (ref 6–23)
Chloride: 100 mEq/L (ref 96–112)
GFR calc Af Amer: 78 mL/min — ABNORMAL LOW (ref >90)
GFR calc non Af Amer: 67 mL/min — ABNORMAL LOW (ref >90)
Potassium: 4.2 mEq/L (ref 3.5–5.1)
Sodium: 134 mEq/L — ABNORMAL LOW (ref 135–145)

## 2013-05-13 LAB — HEMOGLOBIN AND HEMATOCRIT, BLOOD
HCT: 39.5 % (ref 36.0–46.0)
Hemoglobin: 13.5 g/dL (ref 12.0–15.0)

## 2013-05-13 NOTE — Patient Instructions (Signed)
Martha [name]  05/13/2013   Your procedure is scheduled on:  05/17/2013  Report to [facility] at  745  AM.  Call this number if you have problems the morning of surgery: [phone]   Remember:   Do not eat food or drink liquids after midnight.   Take these medicines the morning of surgery with A SIP OF WATER:  Xanax, norvasc, norco, lisinopril. Take 1/2 of your insulin dosage night before your surgery and take your proair and your proventil before you come.   Do not wear jewelry, make-up or nail polish.  Do not wear lotions, powders, or perfumes.   Do not shave 48 hours prior to surgery. Men may shave face and neck.  Do not bring valuables to the hospital.  [facility] is not responsible for any belongings or valuables.               Contacts, dentures or bridgework may not be worn into surgery.  Leave suitcase in the car. After surgery it may be brought to your room.  For patients admitted to the hospital, discharge time is determined by your treatment team.               Patients discharged the day of surgery will not be allowed to drive home.  Name and phone number of your driver: family  Special Instructions: N/A   Please read over the following fact sheets that you were given: Pain Booklet, Coughing and Deep Breathing, Surgical Site Infection Prevention, Anesthesia Post-op Instructions and Care and Recovery After Surgery Esophageal Dilatation The esophagus is the long, narrow tube which carries food and liquid from the mouth to the stomach. Esophageal dilatation is the technique used to stretch a blocked or narrowed portion of the esophagus. This procedure is used when a part of the esophagus has become so narrow that it becomes difficult, painful or even impossible to swallow. This is generally an uncomplicated form of treatment. When this is not successful, chest surgery may be required. This is a much more extensive form of treatment with a longer recovery  time. CAUSES  Some of the more common causes of blockage or strictures of the esophagus are:  Narrowing from longstanding inflammation (soreness and redness) of the lower esophagus. This comes from the constant exposure of the lower esophagus to the acid which bubbles up from the stomach. Over time this causes scarring and narrowing of the lower esophagus.  Hiatal hernia in which a small part of the stomach bulges (herniates) up through the diaphragm. This can cause a gradual narrowing of the end of the esophagus.  Schatzki's Ring is a narrow ring of benign (non-cancerous) fibrous tissue which constricts the lower esophagus. The reason for this is not known.  Scleroderma is a connective tissue disorder that affects the esophagus and makes swallowing difficult.  Achalasia is an absence of nerves to the lower esophagus and to the esophageal sphincter. This is the circular muscle between the stomach and esophagus that relaxes to allow food into the stomach. After swallowing, it contracts to keep food in the stomach. This absence of nerves may be congenital (present since birth). This can cause irregular spasms of the lower esophageal muscle. This spasm does not open up to allow food and fluid through. The result is a persistent blockage with subsequent slow trickling of the esophageal contents into the stomach.  Strictures may develop from swallowing materials which damage the esophagus. Some  examples are strong acids or alkalis such as lye.  Growths such as benign (non-cancerous) and malignant (cancerous) tumors can block the esophagus.  Heredity (present since birth) causes. DIAGNOSIS  Your caregiver often suspects this problem by taking a medical history. They will also do a physical exam. They can then prove their suspicions using X-rays and endoscopy. Endoscopy is an exam in which a tube like a small flexible telescope is used to look at your esophagus.  TREATMENT There are different  stretching (dilating) techniques which can be used. Simple bougie dilatation may be done in the office. This usually takes only a couple minutes. A numbing (anesthetic) spray of the throat is used. Endoscopy, when done, is done in an endoscopy suite, under mild sedation. When fluoroscopy is used, the procedure is performed in X-ray. Other techniques require a little longer time. Recovery is usually quick. There is no waiting time to begin eating and drinking to test success of the treatment. Following are some of the methods used. Narrowing of the esophagus is treated by making it bigger. Commonly this is a mechanical problem which can be treated with stretching. This can be done in different ways. Your caregiver will discuss these with you. Some of the means used are:  A series of graduated (increasing thickness) flexible dilators can be used. These are weighted tubes passed through the esophagus into the stomach. The tubes used become progressively larger until the desired stretched size is reached. Graduated dilators are a simple and quick way of opening the esophagus. No visualization is required.  Another method is the use of endoscopy to place a flexible wire across the stricture. The endoscope is removed and the wire left in place. A dilator with a hole through it from end to end is guided down the esophagus and across the stricture. One or more of these dilators are passed over the wire. At the end of the exam, the wire is removed. This type of treatment may be performed in the X-ray department under fluoroscopy. An advantage of this procedure is the examiner is visualizing the end opening in the esophagus.  Stretching of the esophagus may be done using balloons. Deflated balloons are placed through the endoscope and across the stricture. This type of balloon dilatation is often done at the time of endoscopy or fluoroscopy. Flexible endoscopy allows the examiner to directly view the stricture. A  balloon is inserted in the deflated form into the area of narrowing. It is then inflated with air to a certain pressure that is pre-set for a given circumference. When inflated, it becomes sausage shaped, stretched, and makes the stricture larger.  Achalasia requires a longer larger balloon-type dilator. This is frequently done under X-ray control. In this situation, the spastic muscle fibers in the lower esophagus are stretched. All of the above procedures make the passage of food and water into the stomach easier. They also make it easier for stomach contents to reflux back into the esophagus. Special medications may be used following the procedure to help prevent further stricturing. Proton-pump inhibitor medications are good at decreasing the amount of acid in the stomach juice. When stomach juice refluxes into the esophagus, the juice is no longer as acidic and is less likely to burn or scar the esophagus. RISKS AND COMPLICATIONS Esophageal dilatation is usually performed effectively and without problems. Some complications that can occur are:  A small amount of bleeding almost always happens where the stretching takes place. If this is too excessive it  may require more aggressive treatment.  An uncommon complication is perforation (making a hole) of the esophagus. The esophagus is thin. It is easy to make a hole in it. If this happens, an operation may be necessary to repair this.  A small, undetected perforation could lead to an infection in the chest. This can be very serious. HOME CARE INSTRUCTIONS   If you received sedation for your procedure, do not drive, make important decisions, or perform any activities requiring your full coordination. Do not drink alcohol, take sedatives, or use any mind altering chemicals unless instructed by your caregiver.  You may use throat lozenges or warm salt water gargles if you have throat discomfort  You can begin eating and drinking normally on return  home unless instructed otherwise. Do not purposely try to force large chunks of food down to test the benefits of your procedure.  Mild discomfort can be eased with sips of ice water.  Medications for discomfort may or may not be needed. SEEK IMMEDIATE MEDICAL CARE IF:   You begin vomiting up blood.  You develop black tarry stools  You develop chills or an unexplained temperature of over 101 F (38.3 C)  You develop chest or abdominal pain.  You develop shortness of breath or feel lightheaded or faint.  Your swallowing is becoming more painful, difficult, or you are unable to swallow. MAKE SURE YOU:   Understand these instructions.  Will watch your condition.  Will get help right away if you are not doing well or get worse. Document Released: 09/04/2005 Document Revised: 10/06/2011 Document Reviewed: 10/22/2005 [facility] Patient Information 2014 [place]. Esophagogastroduodenoscopy Esophagogastroduodenoscopy (EGD) is a procedure to examine the lining of the esophagus, stomach, and first part of the small intestine (duodenum). A long, flexible, lighted tube with a camera attached (endoscope) is inserted down the throat to view these organs. This procedure is done to detect problems or abnormalities, such as inflammation, bleeding, ulcers, or growths, in order to treat them. The procedure lasts about 5 20 minutes. It is usually an outpatient procedure, but it may need to be performed in emergency cases in the hospital. LET YOUR CAREGIVER KNOW ABOUT:   Allergies to food or medicine.  All medicines you are taking, including vitamins, herbs, eyedrops, and over-the-counter medicines and creams.  Use of steroids (by mouth or creams).  Previous problems you or members of your family have had with the use of anesthetics.  Any blood disorders you have.  Previous surgeries you have had.  Other health problems you have.  Possibility of pregnancy, if this applies. RISKS AND  COMPLICATIONS  Generally, EGD is a safe procedure. However, as with any procedure, complications can occur. Possible complications include:  Infection.  Bleeding.  Tearing (perforation) of the esophagus, stomach, or duodenum.  Difficulty breathing or not being able to breath.  Excessive sweating.  Spasms of the larynx.  Slowed heartbeat.  Low blood pressure. BEFORE THE PROCEDURE  Do not eat or drink anything for 6 8 hours before the procedure or as directed by your caregiver.  Ask your caregiver about changing or stopping your regular medicines.  If you wear dentures, be prepared to remove them before the procedure.  Arrange for someone to drive you home after the procedure. PROCEDURE   A vein will be accessed to give medicines and fluids. A medicine to relax you (sedative) and a pain reliever will be given through that access into the vein.  A numbing medicine (local anesthetic) may be  sprayed on your throat for comfort and to stop you from gagging or coughing.  A mouth guard may be placed in your mouth to protect your teeth and to keep you from biting on the endoscope.  You will be asked to lie on your left side.  The endoscope is inserted down your throat and into the esophagus, stomach, and duodenum.  Air is put through the endoscope to allow your caregiver to view the lining of your esophagus clearly.  The esophagus, stomach, and duodenum is then examined. During the exam, your caregiver may:  Remove tissue to be examined under a microscope (biopsy) for inflammation, infection, or other medical problems.  Remove growths.  Remove objects (foreign bodies) that are stuck.  Treat any bleeding with medicines or other devices that stop tissues from bleeding (hot cauters, clipping devices).  Widen (dilate) or stretch narrowed areas of the esophagus and stomach.  The endoscope will then be withdrawn. AFTER THE PROCEDURE  You will be taken to a recovery area to be  monitored. You will be able to go home once you are stable and alert.  Do not eat or drink anything until the local anesthetic and numbing medicines have worn off. You may choke.  It is normal to feel bloated, have pain with swallowing, or have a sore throat for a short time. This will wear off.  Your caregiver should be able to discuss his or her findings with you. It will take longer to discuss the test results if any biopsies were taken. Document Released: 11/14/2004 Document Revised: 06/30/2012 Document Reviewed: 06/16/2012 [facility] Patient Information 2014 [place], [place]. PATIENT INSTRUCTIONS POST-ANESTHESIA  IMMEDIATELY FOLLOWING SURGERY:  Do not drive or operate machinery for the first twenty four hours after surgery.  Do not make any important decisions for twenty four hours after surgery or while taking narcotic pain medications or sedatives.  If you develop intractable nausea and vomiting or a severe headache please notify your doctor immediately.  FOLLOW-UP:  Please make an appointment with your surgeon as instructed. You do not need to follow up with anesthesia unless specifically instructed to do so.  WOUND CARE INSTRUCTIONS (if applicable):  Keep a dry clean dressing on the anesthesia/puncture wound site if there is drainage.  Once the wound has quit draining you may leave it open to air.  Generally you should leave the bandage intact for twenty four hours unless there is drainage.  If the epidural site drains for more than 36-48 hours please call the anesthesia department.  QUESTIONS?:  Please feel free to call your physician or the hospital operator if you have any questions, and they will be happy to assist you.

## 2013-05-16 NOTE — Pre-Procedure Instructions (Signed)
Dr Patsey Berthold aware of 350 glucose, will recheck CBG on arrival.

## 2013-05-16 NOTE — Pre-Procedure Instructions (Signed)
Left message for patient to take full dosage of insulin this pm instead of 1/2 dosage as instructed da of preop due to glucose being 350.

## 2013-05-17 ENCOUNTER — Encounter (HOSPITAL_COMMUNITY): Payer: Self-pay | Admitting: *Deleted

## 2013-05-17 ENCOUNTER — Encounter (HOSPITAL_COMMUNITY)
Admission: RE | Disposition: A | Discharge status: Home Health | Payer: Self-pay | Source: Ambulatory Visit | Attending: Gastroenterology

## 2013-05-17 ENCOUNTER — Ambulatory Visit (HOSPITAL_COMMUNITY)
Admission: RE | Admit: 2013-05-17 | Discharge: 2013-05-17 | Disposition: A | Discharge status: Home Health | Payer: Medicaid Other | Source: Ambulatory Visit | Attending: Gastroenterology | Admitting: Gastroenterology

## 2013-05-17 ENCOUNTER — Ambulatory Visit: Admit: 2013-05-17 | Payer: Self-pay | Admitting: Gastroenterology

## 2013-05-17 ENCOUNTER — Ambulatory Visit (HOSPITAL_COMMUNITY): Payer: Medicaid Other | Admitting: Anesthesiology

## 2013-05-17 ENCOUNTER — Encounter (HOSPITAL_COMMUNITY): Payer: Medicaid Other | Admitting: Anesthesiology

## 2013-05-17 DIAGNOSIS — K2960 Other gastritis without bleeding: Secondary | ICD-10-CM

## 2013-05-17 DIAGNOSIS — Z01812 Encounter for preprocedural laboratory examination: Secondary | ICD-10-CM | POA: Insufficient documentation

## 2013-05-17 DIAGNOSIS — R1013 Epigastric pain: Secondary | ICD-10-CM

## 2013-05-17 DIAGNOSIS — R1310 Dysphagia, unspecified: Secondary | ICD-10-CM

## 2013-05-17 DIAGNOSIS — Z6841 Body Mass Index (BMI) 40.0 and over, adult: Secondary | ICD-10-CM | POA: Insufficient documentation

## 2013-05-17 DIAGNOSIS — K648 Other hemorrhoids: Secondary | ICD-10-CM | POA: Insufficient documentation

## 2013-05-17 DIAGNOSIS — K625 Hemorrhage of anus and rectum: Secondary | ICD-10-CM

## 2013-05-17 DIAGNOSIS — K3189 Other diseases of stomach and duodenum: Secondary | ICD-10-CM | POA: Insufficient documentation

## 2013-05-17 DIAGNOSIS — K2970 Gastritis, unspecified, without bleeding: Secondary | ICD-10-CM | POA: Insufficient documentation

## 2013-05-17 DIAGNOSIS — I10 Essential (primary) hypertension: Secondary | ICD-10-CM | POA: Insufficient documentation

## 2013-05-17 DIAGNOSIS — Z794 Long term (current) use of insulin: Secondary | ICD-10-CM | POA: Insufficient documentation

## 2013-05-17 DIAGNOSIS — E119 Type 2 diabetes mellitus without complications: Secondary | ICD-10-CM | POA: Insufficient documentation

## 2013-05-17 HISTORY — PX: SAVORY DILATION: SHX5439

## 2013-05-17 HISTORY — PX: HEMORRHOID BANDING: SHX5850

## 2013-05-17 HISTORY — PX: FLEXIBLE SIGMOIDOSCOPY: SHX5431

## 2013-05-17 HISTORY — PX: ESOPHAGOGASTRODUODENOSCOPY (EGD) WITH PROPOFOL: SHX5813

## 2013-05-17 HISTORY — PX: BIOPSY: SHX5522

## 2013-05-17 LAB — GLUCOSE, CAPILLARY
Glucose-Capillary: 202 mg/dL — ABNORMAL HIGH (ref 70–99)
Glucose-Capillary: 164 mg/dL — ABNORMAL HIGH (ref 70–99)
Glucose-Capillary: 142 mg/dL — ABNORMAL HIGH (ref 70–99)

## 2013-05-17 SURGERY — SIGMOIDOSCOPY, FLEXIBLE
Anesthesia: Monitor Anesthesia Care | Site: Rectum

## 2013-05-17 SURGERY — SIGMOIDOSCOPY, FLEXIBLE
Anesthesia: Monitor Anesthesia Care

## 2013-05-17 MED ORDER — ONDANSETRON HCL 4 MG/2ML IJ SOLN: 4.0000 mg | Freq: Once | INTRAMUSCULAR | Status: DC | PRN

## 2013-05-17 MED ORDER — MIDAZOLAM HCL 2 MG/2ML IJ SOLN
1.0000 mg | INTRAMUSCULAR | Status: DC | PRN
  Administered 2013-05-17 (×2): 2 mg via INTRAVENOUS
  Filled 2013-05-17: qty 2

## 2013-05-17 MED ORDER — ONDANSETRON HCL 4 MG/2ML IJ SOLN
4.0000 mg | Freq: Once | INTRAMUSCULAR | Status: AC
  Administered 2013-05-17: 4 mg via INTRAVENOUS

## 2013-05-17 MED ORDER — FENTANYL CITRATE 0.05 MG/ML IJ SOLN
INTRAMUSCULAR | Status: AC
  Filled 2013-05-17: qty 2

## 2013-05-17 MED ORDER — SIMETHICONE 40 MG/0.6ML PO SUSP
ORAL | Status: DC | PRN
  Administered 2013-05-17: 10:00:00

## 2013-05-17 MED ORDER — GLYCOPYRROLATE 0.2 MG/ML IJ SOLN
INTRAMUSCULAR | Status: AC
  Filled 2013-05-17: qty 1

## 2013-05-17 MED ORDER — LIDOCAINE HCL (CARDIAC) 20 MG/ML IV SOLN
INTRAVENOUS | Status: DC | PRN
  Administered 2013-05-17: 20 mg via INTRAVENOUS
  Administered 2013-05-17: 30 mg via INTRAVENOUS

## 2013-05-17 MED ORDER — MIDAZOLAM HCL 2 MG/2ML IJ SOLN
INTRAMUSCULAR | Status: AC
  Filled 2013-05-17: qty 2

## 2013-05-17 MED ORDER — MINERAL OIL PO OIL
TOPICAL_OIL | ORAL | Status: AC
  Filled 2013-05-17: qty 30

## 2013-05-17 MED ORDER — GLYCOPYRROLATE 0.2 MG/ML IJ SOLN
0.2000 mg | Freq: Once | INTRAMUSCULAR | Status: AC
  Administered 2013-05-17: 0.2 mg via INTRAVENOUS

## 2013-05-17 MED ORDER — BUTAMBEN-TETRACAINE-BENZOCAINE 2-2-14 % EX AERO
1.0000 | INHALATION_SPRAY | Freq: Once | CUTANEOUS | Status: AC
  Administered 2013-05-17: 1 via TOPICAL
  Filled 2013-05-17: qty 56

## 2013-05-17 MED ORDER — PROPOFOL 10 MG/ML IV BOLUS
INTRAVENOUS | Status: AC
  Filled 2013-05-17: qty 20

## 2013-05-17 MED ORDER — LACTATED RINGERS IV SOLN
INTRAVENOUS | Status: DC
  Administered 2013-05-17: 09:00:00 via INTRAVENOUS

## 2013-05-17 MED ORDER — PROPOFOL INFUSION 10 MG/ML OPTIME
INTRAVENOUS | Status: DC | PRN
  Administered 2013-05-17: 100 ug/kg/min via INTRAVENOUS
  Administered 2013-05-17 (×2): via INTRAVENOUS

## 2013-05-17 MED ORDER — FENTANYL CITRATE 0.05 MG/ML IJ SOLN
25.0000 ug | INTRAMUSCULAR | Status: AC
  Administered 2013-05-17 (×2): 25 ug via INTRAVENOUS

## 2013-05-17 MED ORDER — FENTANYL CITRATE 0.05 MG/ML IJ SOLN: 25.0000 ug | INTRAMUSCULAR | Status: DC | PRN

## 2013-05-17 MED ORDER — PROPOFOL 10 MG/ML IV BOLUS
INTRAVENOUS | Status: DC | PRN
  Administered 2013-05-17: 30 mg via INTRAVENOUS

## 2013-05-17 MED ORDER — ONDANSETRON HCL 4 MG/2ML IJ SOLN
INTRAMUSCULAR | Status: AC
  Filled 2013-05-17: qty 2

## 2013-05-17 SURGICAL SUPPLY — 26 items
BAND LIGATOR SUPER 7 2.8 (MISCELLANEOUS) ×4 IMPLANT
BLOCK BITE 60FR ADLT L/F BLUE (MISCELLANEOUS) ×4 IMPLANT
CONT PRE FILLED 20ML (MISCELLANEOUS) ×4 IMPLANT
ELECT REM PT RETURN 9FT ADLT (ELECTROSURGICAL) ×4
ELECTRODE REM PT RTRN 9FT ADLT (ELECTROSURGICAL) ×3 IMPLANT
FCP BXJMBJMB 240X2.8X (CUTTING FORCEPS) ×3
FLOOR PAD 36X40 (MISCELLANEOUS) ×4
FORCEP RJ3 GP 1.8X160 W-NEEDLE (CUTTING FORCEPS) IMPLANT
FORCEPS BIOP RAD 4 LRG CAP 4 (CUTTING FORCEPS) ×4 IMPLANT
FORCEPS BIOP RJ4 240 W/NDL (CUTTING FORCEPS) ×1
FORCEPS BXJMBJMB 240X2.8X (CUTTING FORCEPS) ×3 IMPLANT
INJECTOR/SNARE I SNARE (MISCELLANEOUS) ×4 IMPLANT
LUBRICANT JELLY 4.5OZ STERILE (MISCELLANEOUS) ×4 IMPLANT
MANIFOLD NEPTUNE II (INSTRUMENTS) ×4 IMPLANT
NEEDLE SCLEROTHERAPY 25GX240 (NEEDLE) ×4 IMPLANT
PAD FLOOR 36X40 (MISCELLANEOUS) ×3 IMPLANT
PROBE APC STR FIRE (PROBE) ×4 IMPLANT
PROBE INJECTION GOLD (MISCELLANEOUS) ×1
PROBE INJECTION GOLD 7FR (MISCELLANEOUS) ×3 IMPLANT
SNARE ROTATE MED OVAL 20MM (MISCELLANEOUS) IMPLANT
SNARE SHORT THROW 13M SML OVAL (MISCELLANEOUS) ×4 IMPLANT
SYR 50ML LL SCALE MARK (SYRINGE) ×4 IMPLANT
TRAP SPECIMEN MUCOUS 40CC (MISCELLANEOUS) ×4 IMPLANT
TUBING ENDO SMARTCAP PENTAX (MISCELLANEOUS) ×4 IMPLANT
TUBING IRRIGATION ENDOGATOR (MISCELLANEOUS) ×4 IMPLANT
WATER STERILE IRR 1000ML POUR (IV SOLUTION) ×4 IMPLANT

## 2013-05-17 NOTE — Anesthesia Postprocedure Evaluation (Signed)
  Anesthesia Post-op Note  Patient: Martha [name]  Procedure(s) Performed: Procedure(s) with comments: FLEXIBLE SIGMOIDOSCOPY WITH PROPOFOL (N/A) ESOPHAGOGASTRODUODENOSCOPY (EGD) WITH PROPOFOL (N/A) - g junction is 42 cm SAVORY DILATION (N/A) - 14/15/16 HEMORRHOID BANDING (N/A) - 2 bands placed BIOPSY (N/A)  Patient Location: PACU  Anesthesia Type:MAC  Level of Consciousness: awake, alert  and oriented  Airway and Oxygen Therapy: Patient Spontanous Breathing  Post-op Pain: none  Post-op Assessment: Post-op Vital signs reviewed, Patient's Cardiovascular Status Stable, Respiratory Function Stable, Patent Airway and No signs of Nausea or vomiting  Post-op Vital Signs: Reviewed and stable  Complications: No apparent anesthesia complications

## 2013-05-17 NOTE — Anesthesia Preprocedure Evaluation (Addendum)
Anesthesia Evaluation  Patient identified by MRN, date of birth, ID band Patient awake    Reviewed: Allergy & Precautions, H&P , NPO status , Patient's Chart, lab work & pertinent test results  Airway Mallampati: II TM Distance: >3 FB Neck ROM: Full    Dental  (+) Teeth Intact, Poor Dentition and Chipped,    Pulmonary sleep apnea and Continuous Positive Airway Pressure Ventilation ,  breath sounds clear to auscultation        Cardiovascular hypertension, Pt. on medications Rhythm:Regular Rate:Normal     Neuro/Psych PSYCHIATRIC DISORDERS Depression Bipolar Disorder    GI/Hepatic GERD-  Medicated,  Endo/Other  diabetes, Poorly Controlled, Type 2, Insulin DependentMorbid obesity  Renal/GU      Musculoskeletal   Abdominal   Peds  Hematology   Anesthesia Other Findings   Reproductive/Obstetrics                           Anesthesia Physical Anesthesia Plan  ASA: III  Anesthesia Plan: MAC   Post-op Pain Management:    Induction: Intravenous  Airway Management Planned: Simple Face Mask  Additional Equipment:   Intra-op Plan:   Post-operative Plan:   Informed Consent: I have reviewed the patients History and Physical, chart, labs and discussed the procedure including the risks, benefits and alternatives for the proposed anesthesia with the patient or authorized representative who has indicated his/her understanding and acceptance.   Dental advisory given  Plan Discussed with:   Anesthesia Plan Comments:         Anesthesia Quick Evaluation

## 2013-05-17 NOTE — H&P (View-Only) (Signed)
SYMPTOMS: LARGE AMOUNT OF RECTAL BLEEDING: DAILY AFTER EVERY BM,  OCCASIONAL PAIN, ITCHING, BURNING. C/o solid dysphagia. LAST EGD/DIL NOV 2011. HAS 2 MIXED RACE GRANDCHILDREN.  CONSTIPATION: NO DIARRHEA: YES  STRAINS WITH BMs: YES  TIME SPENT ON TOILET: 10 MINS TISSUE POKES OUT OF RECTUM: YES- GOES BACK BY ITSELF FIBER SUPPLEMENTS: NO  GLASSES OF WATER/DAY: 6-8: NO   ADDITIONAL QUESTIONS:  LATEX ALLERGY: NO PREGNANT: NO ERECTILE DYSFUNCTION MEDS OR NITRATES: NO ANTICOAGULATION/ANTIPLATELET MEDS: NO DIAGNOSED WITH CROHN'S DISEASE, PROCTITIS, PORTAL HTN, OR ANAL/RECTAL CA: NO TAKING IMMUNOSUPPRESSANTS/XRT: NO   PLAN: 1. DISCUSSED MANAGEMENT OPTIONS FOR PT.  2. ANOSCOPY/CRH BANDING TODAY FOLLOWED BY FLEX/SIG OH BANDING 3. EGD/DIL WITH PROPOFOL   PROCEDURE TECHNIQUE: BENEFITS RISK EXPLAINED TO PT. ANOSCOPY PERFORMED. BULGING INTERNAL HEMORRHOID COLUMN IN THE R POSTERIOR AND ANTERIOR COLUMNS. ONE CRH BAND PLACED IN RIGHT POSTERIOR POSITION. POST-BANDING RECTAL EXAM REVEALED GOOD PLACEMENT. EXAM NON-TENDER.

## 2013-05-17 NOTE — Transfer of Care (Signed)
Immediate Anesthesia Transfer of Care Note  Patient: Martha [name]  Procedure(s) Performed: Procedure(s) with comments: FLEXIBLE SIGMOIDOSCOPY WITH PROPOFOL (N/A) ESOPHAGOGASTRODUODENOSCOPY (EGD) WITH PROPOFOL (N/A) - g junction is 42 cm SAVORY DILATION (N/A) - 14/15/16 HEMORRHOID BANDING (N/A) - 2 bands placed BIOPSY (N/A)  Patient Location: PACU  Anesthesia Type:MAC  Level of Consciousness: awake  Airway & Oxygen Therapy: Patient Spontanous Breathing  Post-op Assessment: Report given to PACU RN  Post vital signs: Reviewed and stable  Complications: No apparent anesthesia complications

## 2013-05-17 NOTE — OR Nursing (Signed)
Patient informed Dr. Oneida Alar that she did not want her results discussed with her sister-in-law. Dr. Oneida Alar aware, PACU nurses informed, Post-op nurses informed and OR team informed. Patient understands that sister-in-law has to sign discharge form stating that she takes responsibility for the patient. Patient verbalized understanding.

## 2013-05-17 NOTE — Interval H&P Note (Signed)
History and Physical Interval Note:  05/17/2013 8:45 AM  Martha [name]  has presented today for surgery, with the diagnosis of BRBPR AND DYSPHAGIA  The various methods of treatment have been discussed with the patient and family. After consideration of risks, benefits and other options for treatment, the patient has consented to  Procedure(s): FLEXIBLE SIGMOIDOSCOPY WITH PROPOFOL (N/A) ESOPHAGOGASTRODUODENOSCOPY (EGD) WITH PROPOFOL (N/A) SAVORY DILATION (N/A) MALONEY DILATION (N/A) HEMORRHOID BANDING (N/A) as a surgical intervention .  The patient's history has been reviewed, patient examined, no change in status, stable for surgery.  I have reviewed the patient's chart and labs.  Questions were answered to the patient's satisfaction.     [employer]

## 2013-05-19 ENCOUNTER — Encounter (HOSPITAL_COMMUNITY): Payer: Self-pay | Admitting: Gastroenterology

## 2013-05-19 NOTE — Op Note (Signed)
[facility] [address] [place], [address]   FLEX SIGMOIDOSCOPY PROCEDURE REPORT  PATIENT: Martha [name], Martha [name]  MR#: [id number] BIRTHDATE: 04-May-1966 , 47  yrs. old GENDER: Female ENDOSCOPIST: Barney Drain, MD REFERRED [id number] Luan Pulling, M.D. PROCEDURE DATE:  05/17/2013 PROCEDURE:   Hemorrhoidectomy via banding, clips or ligation and Sigmoidoscopy, screening INDICATIONS:rectal bleeding. MEDICATIONS: MAC sedation, administered by CRNA  DESCRIPTION OF PROCEDURE:    Physical exam was performed.  Informed consent was obtained from the patient after explaining the benefits, risks, and alternatives to procedure.  The patient was connected to monitor and placed in left lateral position. Continuous oxygen was provided by nasal cannula and IV medicine administered through an indwelling cannula.  After administration of sedation and rectal exam, the patients rectum was intubated and the     colonoscope was advanced under direct visualization to the cecum.  The scope was removed slowly by carefully examining the color, texture, anatomy, and integrity mucosa on the way out.  The patient was recovered in endoscopy and discharged home in satisfactory condition.    COLON FINDINGS: The colonic mucosa appeared normal and Moderate sized internal hemorrhoids were found. 2 BANDS PLACED. PRIRO BANDING SIT IDENTIFIED AND SMAL ULCER SEEN ABOVE THE DENTATE LINE.   PREP QUALITY: gooD     COMPLICATIONS: None  ENDOSCOPIC IMPRESSION: 1.   RECTAL BLEEDING DUE TO Moderate sized internal hemorrhoids  RECOMMENDATIONS: DRINK WATER CONTINUE YOUR WEIGHT LOSS EFFORTS. FOLLOW A HIGH FIBER/DIABETIC DIET.  AVOID ITEMS THAT CAUSE BLOATING.  BIOPSY RESULTS WILL BE BACK IN 7 DAYS.  FOLLOW UP NOV 5 AT 1115.       _______________________________ Lorrin MaisBarney Drain, MD 05/19/2013 2:09 PM

## 2013-05-19 NOTE — Op Note (Signed)
[facility] [address] [place], [address]   ENDOSCOPY PROCEDURE REPORT  PATIENT: Martha, [name]  MR#: [id number] BIRTHDATE: 1965/11/27 , 61  yrs. old GENDER: Female  ENDOSCOPIST: Barney Drain, MD REFFERED [id number] Luan Pulling, M.D.  PROCEDURE DATE:  05/17/2013 PROCEDURE:   EGD with biopsy and EGD with dilatation over guidewire  INDICATIONS:1.  dysphagia.   2.  dyspepsia.  BMI 54 MEDICATIONS: MAC sedation, administered by CRNA TOPICAL ANESTHETIC: Cetacaine Spray  DESCRIPTION OF PROCEDURE:   After the risks benefits and alternatives of the procedure were thoroughly explained, informed consent was obtained.  The     endoscope was introduced through the mouth and advanced to the second portion of the duodenum. The instrument was slowly withdrawn as the mucosa was carefully examined.  Prior to withdrawal of the scope, the guidwire was placed.  The esophagus was dilated successfully.  The patient was recovered in endoscopy and discharged home in satisfactory condition.   ESOPHAGUS: The esophagus was otherwise normal. BIOPSIES OBTAINED 20 CMA ND 35 CM FROM THE TEETH TO EVALUATE FOR EOSINOPHILIC ESOPHAGISTI. GE JXN 42 CM FROM THE TEETH.  EMPIRIC DILATION PERFORMED DUE TO C/O DYSPHAGIA.STOMACH: B Moderate nodular gastritis (inflammation) was found in the gastric antrum. BIOPSIES OBTAINED VIA COLD FORCEPS. DUODENUM: The duodenal mucosa showed no abnormalities in the bulb and second portion of the duodenum.   Dilation was then performed at [place]: Savary over guidewire Size(s): 14-16 mm Resistance: minimal Heme: none  COMPLICATIONS: There were no complications.  ENDOSCOPIC IMPRESSION: 1.   NORMAL ESOPHAGUS 2.   MODERATE Nodular gastritis  RECOMMENDATIONS: CONTINUE YOUR WEIGHT LOSS EFFORTS. TAKE OMEPRAZOLE 30 MINUTES PRIOR TO MEALS TWICE DAILY. AVOID ITEMS THAT TRIGGER GASTRITIS. FOLLOW A HIGH FIBER/DIABETIC DIET.  AVOID ITEMS THAT CAUSE  BLOATING.  BIOPSY RESULTS WILL BE BACK IN 7 DAYS.  FOLLOW UP Jun 01 1114.      _______________________________ Lorrin MaisBarney Drain, MD 05/19/2013 2:16 PM      PATIENT NAME:  Martha, [name] MR#: [id number]

## 2013-05-19 NOTE — Progress Notes (Signed)
Called Dr. Oneida Alar about patient being constipated. Ordered to inform patient to take Doculax 10 mg at 1600 (4PM) and again at at 1900 (7PM). Called patient. Relayed information. Patient verbalized understanding.

## 2013-05-23 ENCOUNTER — Telehealth: Payer: Self-pay | Admitting: Gastroenterology

## 2013-05-23 DIAGNOSIS — K648 Other hemorrhoids: Secondary | ICD-10-CM

## 2013-05-23 NOTE — Telephone Encounter (Signed)
Please call pt. HER stomach Bx DID NOT SHOW H PYLORI INFECTION.    CONTINUE WEIGHT LOSS EFFORTS. TAKE OMEPRAZOLE 30 MINUTES PRIOR TO MEALS TWICE DAILY.  AVOID ITEMS THAT TRIGGER GASTRITIS.   FOLLOW A HIGH FIBER/DIABETIC DIET. AVOID ITEMS THAT CAUSE BLOATING  FOLLOW UP IN NOV 5 AT 1115 AM-ENDO BANDING FOLLOW UP.

## 2013-05-24 NOTE — Telephone Encounter (Signed)
Appt made

## 2013-05-27 DIAGNOSIS — Z794 Long term (current) use of insulin: Secondary | ICD-10-CM | POA: Insufficient documentation

## 2013-05-27 DIAGNOSIS — G2581 Restless legs syndrome: Secondary | ICD-10-CM | POA: Insufficient documentation

## 2013-05-27 DIAGNOSIS — Z862 Personal history of diseases of the blood and blood-forming organs and certain disorders involving the immune mechanism: Secondary | ICD-10-CM | POA: Insufficient documentation

## 2013-05-27 DIAGNOSIS — K219 Gastro-esophageal reflux disease without esophagitis: Secondary | ICD-10-CM | POA: Insufficient documentation

## 2013-05-27 DIAGNOSIS — Z87891 Personal history of nicotine dependence: Secondary | ICD-10-CM | POA: Insufficient documentation

## 2013-05-27 DIAGNOSIS — E119 Type 2 diabetes mellitus without complications: Secondary | ICD-10-CM | POA: Insufficient documentation

## 2013-05-27 DIAGNOSIS — I10 Essential (primary) hypertension: Secondary | ICD-10-CM | POA: Insufficient documentation

## 2013-05-27 DIAGNOSIS — F319 Bipolar disorder, unspecified: Secondary | ICD-10-CM | POA: Insufficient documentation

## 2013-05-27 DIAGNOSIS — Z79899 Other long term (current) drug therapy: Secondary | ICD-10-CM | POA: Insufficient documentation

## 2013-05-27 DIAGNOSIS — J40 Bronchitis, not specified as acute or chronic: Secondary | ICD-10-CM | POA: Insufficient documentation

## 2013-05-27 DIAGNOSIS — J329 Chronic sinusitis, unspecified: Secondary | ICD-10-CM | POA: Insufficient documentation

## 2013-05-27 DIAGNOSIS — G4733 Obstructive sleep apnea (adult) (pediatric): Secondary | ICD-10-CM | POA: Insufficient documentation

## 2013-05-27 NOTE — Telephone Encounter (Signed)
LMOM to call.

## 2013-05-28 ENCOUNTER — Emergency Department (HOSPITAL_COMMUNITY): Payer: Medicaid Other

## 2013-05-28 ENCOUNTER — Emergency Department (HOSPITAL_COMMUNITY)
Admission: EM | Admit: 2013-05-28 | Discharge: 2013-05-28 | Disposition: A | Discharge status: Home / Self Care | Payer: Medicaid Other | Attending: Emergency Medicine | Admitting: Emergency Medicine

## 2013-05-28 ENCOUNTER — Encounter (HOSPITAL_COMMUNITY): Payer: Self-pay | Admitting: Emergency Medicine

## 2013-05-28 DIAGNOSIS — J329 Chronic sinusitis, unspecified: Secondary | ICD-10-CM

## 2013-05-28 DIAGNOSIS — J40 Bronchitis, not specified as acute or chronic: Secondary | ICD-10-CM

## 2013-05-28 MED ORDER — HYDROCOD POLST-CHLORPHEN POLST 10-8 MG/5ML PO LQCR
5.0000 mL | Freq: Once | ORAL | Status: AC
  Administered 2013-05-28: 5 mL via ORAL
  Filled 2013-05-28: qty 5

## 2013-05-28 MED ORDER — OXYMETAZOLINE HCL 0.05 % NA SOLN
1.0000 | Freq: Once | NASAL | Status: AC
  Administered 2013-05-28: 1 via NASAL
  Filled 2013-05-28: qty 15

## 2013-05-28 MED ORDER — PREDNISONE 10 MG PO TABS: ORAL_TABLET | ORAL | Status: DC

## 2013-05-28 MED ORDER — ALBUTEROL SULFATE (5 MG/ML) 0.5% IN NEBU
2.5000 mg | INHALATION_SOLUTION | Freq: Once | RESPIRATORY_TRACT | Status: AC
  Administered 2013-05-28: 2.5 mg via RESPIRATORY_TRACT
  Filled 2013-05-28: qty 0.5

## 2013-05-28 MED ORDER — PHENYLEPH-DIPHENHYD-CODEINE 7.5-10-7.5 MG/5ML PO SYRP: 5.0000 mL | ORAL_SOLUTION | Freq: Four times a day (QID) | ORAL | Status: DC

## 2013-05-28 MED ORDER — PREDNISONE 50 MG PO TABS
60.0000 mg | ORAL_TABLET | Freq: Once | ORAL | Status: AC
  Administered 2013-05-28: 60 mg via ORAL
  Filled 2013-05-28 (×2): qty 1

## 2013-05-28 NOTE — ED Provider Notes (Signed)
Pharmacy called and states the tussionex is off the market, substituted Mucinex DM BID #[id number]  [name], MD 05/28/13 1704

## 2013-05-28 NOTE — ED Provider Notes (Signed)
CSN: [id number]     Arrival date & time 05/27/13  2356 History   First MD Initiated Contact with Patient 05/28/13 0005     Chief Complaint  Patient presents with  . Cough  . Shortness of Breath   (Consider location/radiation/quality/duration/timing/severity/associated sxs/prior Treatment) Patient is a 47 y.o. female presenting with cough and shortness of breath. The history is provided by the patient.  Cough Cough characteristics:  Non-productive Severity:  Moderate Onset quality:  Gradual Duration:  2 weeks Timing:  Intermittent Progression:  Worsening Smoker: no   Context: weather changes   Relieved by:  Nothing Worsened by:  Lying down Associated symptoms: rhinorrhea, shortness of breath and sinus congestion   Associated symptoms: no chest pain, no chills, no eye discharge and no wheezing   Risk factors: no recent travel   Shortness of Breath Associated symptoms: cough   Associated symptoms: no abdominal pain, no chest pain, no neck pain and no wheezing     Past Medical History  Diagnosis Date  . Diabetes mellitus   . Hypertension   . RLS (restless legs syndrome)   . Acid reflux   . Bipolar 1 disorder   . Depression   . OSA (obstructive sleep apnea)     cpap  . Anemia    Past Surgical History  Procedure Laterality Date  . Tubal ligation    . Endometrial ablation    . Colonoscopy  06/06/2010    [id number] bleeding secondary to internal hemorrhoids but incomplete evaluation secondary to poor right colon prep/small rectal and sigmoid colon polyps (hyperplastic). PROPOFOL  . Esophagogastroduodenoscopy  06/06/2010    [id number] erythema and edema of body of stomach, with sessile polypoid lesions. bx benign. no h.pylori  . Colonoscopy  May 2013    Dr. Gilliam/[facility]: 5 mm a descending colon polyp, hyperplastic. Adequate bowel prep.  . Flexible sigmoidoscopy N/A 05/17/2013    Procedure: FLEXIBLE SIGMOIDOSCOPY WITH PROPOFOL;  Surgeon: [name], MD;  Location: [facility];  Service: Endoscopy;  Laterality: N/A;  . Esophagogastroduodenoscopy (egd) with propofol N/A 05/17/2013    Procedure: ESOPHAGOGASTRODUODENOSCOPY (EGD) WITH PROPOFOL;  Surgeon: [name], MD;  Location: [facility];  Service: Endoscopy;  Laterality: N/A;  g junction is 42 cm  . Savory dilation N/A 05/17/2013    Procedure: SAVORY DILATION;  Surgeon: [name], MD;  Location: [facility];  Service: Endoscopy;  Laterality: N/A;  14/15/16  . Hemorrhoid banding N/A 05/17/2013    Procedure: HEMORRHOID BANDING;  Surgeon: [name], MD;  Location: [facility];  Service: Endoscopy;  Laterality: N/A;  2 bands placed  . Esophageal biopsy N/A 05/17/2013    Procedure: BIOPSY;  Surgeon: [name], MD;  Location: [facility];  Service: Endoscopy;  Laterality: N/A;   Family History  Problem Relation Age of Onset  . Adopted: Yes   History  Substance Use Topics  . Smoking status: Former Smoker -- 1.50 packs/day for 25 years    Types: Cigarettes    Quit date: 10/23/2007  . Smokeless tobacco: Never Used  . Alcohol Use: No   OB History   Grav Para Term Preterm Abortions TAB SAB Ect Mult Living   2 2 1 1      2      Review of Systems  Constitutional: Negative for chills and activity change.       All ROS Neg except as noted in HPI  HENT: Positive for rhinorrhea. Negative for nosebleeds.   Eyes: Negative for photophobia  and discharge.  Respiratory: Positive for cough and shortness of breath. Negative for wheezing.   Cardiovascular: Negative for chest pain and palpitations.  Gastrointestinal: Negative for abdominal pain and blood in stool.  Genitourinary: Negative for dysuria, frequency and hematuria.  Musculoskeletal: Negative for arthralgias, back pain and neck pain.  Skin: Negative.   Neurological: Negative for dizziness, seizures and speech difficulty.  Psychiatric/Behavioral: Negative for hallucinations and confusion.    Allergies  Codeine  Home Medications   Current Outpatient Rx   Name  Route  Sig  Dispense  Refill  . albuterol (PROAIR HFA) 108 (90 BASE) MCG/ACT inhaler   Inhalation   Inhale 2 puffs into the lungs every 6 (six) hours as needed for wheezing or shortness of breath.         Marland Kitchen albuterol (PROVENTIL) (2.5 MG/3ML) 0.083% nebulizer solution   Nebulization   Take 2.5 mg by nebulization every 6 (six) hours as needed for wheezing or shortness of breath.         . ALPRAZolam (XANAX) 1 MG tablet   Oral   Take 1 mg by mouth 4 (four) times daily.           Marland Kitchen amLODipine (NORVASC) 10 MG tablet   Oral   Take 10 mg by mouth daily.         Marland Kitchen HYDROcodone-acetaminophen (NORCO) 10-325 MG per tablet   Oral   Take 1 tablet by mouth every 6 (six) hours as needed for pain.         Marland Kitchen insulin aspart (NOVOLOG) 100 UNIT/ML injection   Subcutaneous   Inject 10-20 Units into the skin 3 (three) times daily. Inject under skin 25 units before meals and hold if skips meal.  Do not give at bedtime  Correction AC (G-120) 2520         . Insulin Glargine (LANTUS SOLOSTAR) 100 UNIT/ML SOPN   Subcutaneous   Inject 80 Units into the skin. Inject under skin 80 units at bedtime (divide into 2 equal injections)         . lisinopril-hydrochlorothiazide (PRINZIDE,ZESTORETIC) 20-12.5 MG per tablet   Oral   Take 1 tablet by mouth 2 (two) times daily.          . metFORMIN (GLUCOPHAGE) 1000 MG tablet   Oral   Take 1,000 mg by mouth 2 (two) times daily with a meal.         . omeprazole (PRILOSEC) 20 MG capsule   Oral   Take 20 mg by mouth 2 (two) times daily.          Marland Kitchen rOPINIRole (REQUIP) 1 MG tablet   Oral   Take 1 mg by mouth at bedtime.          . simvastatin (ZOCOR) 40 MG tablet   Oral   Take 40 mg by mouth every evening.         Marland Kitchen UNKNOWN TO PATIENT   Topical   Apply 1 application topically daily as needed (for irritation (vaginal area) COMPOUND MEDICATION: Ketoconazole mixed with another medication  (name is unknown)).          BP 204/116   Pulse 91  Temp(Src) 98.3 F (36.8 C) (Oral)  Resp 24  Ht 5\' 7"  (1.702 m)  Wt 358 lb (162.388 kg)  BMI 56.06 kg/m2  SpO2 96% Physical Exam  Nursing note and vitals reviewed. Constitutional: She is oriented to person, place, and time. She appears well-developed and well-nourished.  Non-toxic appearance.  HENT:  Head: Normocephalic.  Right Ear: Tympanic membrane and external ear normal.  Left Ear: Tympanic membrane and external ear normal.  Nasal congestion present.  Eyes: EOM and lids are normal. Pupils are equal, round, and reactive to light.  Neck: Normal range of motion. Neck supple. Carotid bruit is not present.  Cardiovascular: Normal rate, regular rhythm, normal heart sounds, intact distal pulses and normal pulses.   Pulmonary/Chest: Breath sounds normal. No respiratory distress.  Few scattered rhonchi present. Symmetrical rise and fall of the chest.  Abdominal: Soft. Bowel sounds are normal. There is no tenderness. There is no guarding.  Musculoskeletal: Normal range of motion.  Lymphadenopathy:       Head (right side): No submandibular adenopathy present.       Head (left side): No submandibular adenopathy present.    She has no cervical adenopathy.  Neurological: She is alert and oriented to person, place, and time. She has normal strength. No cranial nerve deficit or sensory deficit.  Skin: Skin is warm and dry.  Psychiatric: She has a normal mood and affect. Her speech is normal.    ED Course  Procedures (including critical care time) Labs Review Labs Reviewed - No data to display Imaging Review No results found.  EKG Interpretation   None       MDM  No diagnosis found. *I have reviewed nursing notes, vital signs, and all appropriate lab and imaging results for this patient.**  `Chest xray is negative for acute problem. Pulse ox 96% on room air. WNL by my interpretation. Pt speaks in complete sentences. Rx for prednisone given. Pt to continue albuterol. Use  tylenol for fever and aching. She will see her PCP for recheck. She will return to the ED if any change in condition.  Lenox Ahr, PA-C 06/06/13 2111  Nat Christen, MD 06/07/13 [phone]

## 2013-05-28 NOTE — ED Notes (Signed)
Pt reports cough for about a week, with SOB.  Pt has chronic bronchitis, no relief from nebulizer treatments.

## 2013-05-29 ENCOUNTER — Encounter (HOSPITAL_COMMUNITY): Payer: Self-pay | Admitting: Emergency Medicine

## 2013-05-29 ENCOUNTER — Inpatient Hospital Stay (HOSPITAL_COMMUNITY)
Admission: EM | Admit: 2013-05-29 | Discharge: 2013-06-02 | DRG: 600 | Disposition: A | Discharge status: Home Health | Payer: Medicaid Other | Attending: Pulmonary Disease | Admitting: Pulmonary Disease

## 2013-05-29 DIAGNOSIS — Z6841 Body Mass Index (BMI) 40.0 and over, adult: Secondary | ICD-10-CM

## 2013-05-29 DIAGNOSIS — E669 Obesity, unspecified: Secondary | ICD-10-CM

## 2013-05-29 DIAGNOSIS — F411 Generalized anxiety disorder: Secondary | ICD-10-CM | POA: Diagnosis present

## 2013-05-29 DIAGNOSIS — F319 Bipolar disorder, unspecified: Secondary | ICD-10-CM | POA: Diagnosis present

## 2013-05-29 DIAGNOSIS — K219 Gastro-esophageal reflux disease without esophagitis: Secondary | ICD-10-CM | POA: Diagnosis present

## 2013-05-29 DIAGNOSIS — IMO0001 Reserved for inherently not codable concepts without codable children: Secondary | ICD-10-CM | POA: Diagnosis present

## 2013-05-29 DIAGNOSIS — J40 Bronchitis, not specified as acute or chronic: Secondary | ICD-10-CM | POA: Diagnosis present

## 2013-05-29 DIAGNOSIS — Z87891 Personal history of nicotine dependence: Secondary | ICD-10-CM

## 2013-05-29 DIAGNOSIS — N611 Abscess of the breast and nipple: Secondary | ICD-10-CM

## 2013-05-29 DIAGNOSIS — D649 Anemia, unspecified: Secondary | ICD-10-CM | POA: Diagnosis present

## 2013-05-29 DIAGNOSIS — E1165 Type 2 diabetes mellitus with hyperglycemia: Secondary | ICD-10-CM

## 2013-05-29 DIAGNOSIS — R7309 Other abnormal glucose: Secondary | ICD-10-CM

## 2013-05-29 DIAGNOSIS — G2581 Restless legs syndrome: Secondary | ICD-10-CM | POA: Diagnosis present

## 2013-05-29 DIAGNOSIS — R739 Hyperglycemia, unspecified: Secondary | ICD-10-CM

## 2013-05-29 DIAGNOSIS — N61 Mastitis without abscess: Principal | ICD-10-CM

## 2013-05-29 DIAGNOSIS — I10 Essential (primary) hypertension: Secondary | ICD-10-CM | POA: Diagnosis present

## 2013-05-29 DIAGNOSIS — G4733 Obstructive sleep apnea (adult) (pediatric): Secondary | ICD-10-CM | POA: Diagnosis present

## 2013-05-29 DIAGNOSIS — E119 Type 2 diabetes mellitus without complications: Secondary | ICD-10-CM

## 2013-05-29 DIAGNOSIS — Z794 Long term (current) use of insulin: Secondary | ICD-10-CM

## 2013-05-29 HISTORY — DX: Unspecified osteoarthritis, unspecified site: M19.90

## 2013-05-29 HISTORY — DX: Chronic obstructive pulmonary disease, unspecified: J44.9

## 2013-05-29 LAB — URINALYSIS, ROUTINE W REFLEX MICROSCOPIC
Bilirubin Urine: NEGATIVE
Ketones, ur: NEGATIVE mg/dL
Nitrite: NEGATIVE
Protein, ur: 100 mg/dL — AB
Specific Gravity, Urine: 1.02 (ref 1.005–1.030)
Urobilinogen, UA: 0.2 mg/dL (ref 0.0–1.0)

## 2013-05-29 LAB — CBC WITH DIFFERENTIAL/PLATELET
Basophils Absolute: 0 10*3/uL (ref 0.0–0.1)
Basophils Relative: 1 % (ref 0–1)
Eosinophils Relative: 3 % (ref 0–5)
HCT: 38.3 % (ref 36.0–46.0)
Hemoglobin: 13.2 g/dL (ref 12.0–15.0)
Lymphocytes Relative: 31 % (ref 12–46)
MCH: 29.5 pg (ref 26.0–34.0)
MCHC: 34.5 g/dL (ref 30.0–36.0)
MCV: 85.5 fL (ref 78.0–100.0)
Monocytes Absolute: 0.4 10*3/uL (ref 0.1–1.0)
Neutro Abs: 5.2 10*3/uL (ref 1.7–7.7)
Platelets: 227 10*3/uL (ref 150–400)
RDW: 14.5 % (ref 11.5–15.5)
WBC: 8.6 10*3/uL (ref 4.0–10.5)

## 2013-05-29 LAB — BASIC METABOLIC PANEL
CO2: 24 mEq/L (ref 19–32)
Calcium: 9 mg/dL (ref 8.4–10.5)
GFR calc Af Amer: 69 mL/min — ABNORMAL LOW (ref >90)
Sodium: 129 mEq/L — ABNORMAL LOW (ref 135–145)

## 2013-05-29 LAB — BLOOD GAS, VENOUS
FIO2: 0.21 %
O2 Saturation: 53 %
Patient temperature: 98.6
pH, Ven: 7.348 — ABNORMAL HIGH (ref 7.250–7.300)
pO2, Ven: 29.2 mmHg — CL (ref 30.0–45.0)

## 2013-05-29 LAB — GLUCOSE, CAPILLARY
Glucose-Capillary: 547 mg/dL — ABNORMAL HIGH (ref 70–99)
Glucose-Capillary: 511 mg/dL — ABNORMAL HIGH (ref 70–99)

## 2013-05-29 MED ORDER — SODIUM CHLORIDE 0.9 % IV SOLN
1000.0000 mL | INTRAVENOUS | Status: DC
  Administered 2013-05-29: 1000 mL via INTRAVENOUS

## 2013-05-29 MED ORDER — SODIUM CHLORIDE 0.9 % IV BOLUS (SEPSIS)
500.0000 mL | Freq: Once | INTRAVENOUS | Status: AC
  Administered 2013-05-29: 500 mL via INTRAVENOUS

## 2013-05-29 MED ORDER — CLINDAMYCIN PHOSPHATE 600 MG/50ML IV SOLN
600.0000 mg | Freq: Once | INTRAVENOUS | Status: AC
  Administered 2013-05-29: 600 mg via INTRAVENOUS
  Filled 2013-05-29: qty 50

## 2013-05-29 MED ORDER — SODIUM CHLORIDE 0.9 % IV SOLN
INTRAVENOUS | Status: DC
  Administered 2013-05-30: 02:00:00 via INTRAVENOUS
  Filled 2013-05-29: qty 1

## 2013-05-29 NOTE — ED Provider Notes (Signed)
CSN: [id number]     Arrival date & time 05/29/13  1811 History   First MD Initiated Contact with Patient 05/29/13 2008     Chief Complaint  Patient presents with  . Abscess   (Consider location/radiation/quality/duration/timing/severity/associated sxs/prior Treatment) Patient is a 47 y.o. female presenting with abscess. The history is provided by the patient.  Abscess Associated symptoms: no fever, no headaches, no nausea and no vomiting    patient presents with an abscess to her right breast. She states it's been there for a week. She was seen in the ED yesterday for difficulty breathing, she should mention at that time. She states the breast started to drain after she arrived in the ED. She denies fever. She states that her blood sugars have been high at home. She is a diabetic. She states she's had previous abscesses, but this one is bad. No cough. No vomiting. No nausea.  Past Medical History  Diagnosis Date  . Diabetes mellitus   . Hypertension   . RLS (restless legs syndrome)   . Acid reflux   . Bipolar 1 disorder   . Depression   . OSA (obstructive sleep apnea)     cpap  . Anemia    Past Surgical History  Procedure Laterality Date  . Tubal ligation    . Endometrial ablation    . Colonoscopy  06/06/2010    [id number] bleeding secondary to internal hemorrhoids but incomplete evaluation secondary to poor right colon prep/small rectal and sigmoid colon polyps (hyperplastic). PROPOFOL  . Esophagogastroduodenoscopy  06/06/2010    [id number] erythema and edema of body of stomach, with sessile polypoid lesions. bx benign. no h.pylori  . Colonoscopy  May 2013    Dr. Gilliam/[facility]: 5 mm a descending colon polyp, hyperplastic. Adequate bowel prep.  . Flexible sigmoidoscopy N/A 05/17/2013    Procedure: FLEXIBLE SIGMOIDOSCOPY WITH PROPOFOL;  Surgeon: [name], MD;  Location: [facility];  Service: Endoscopy;  Laterality: N/A;  . Esophagogastroduodenoscopy (egd) with propofol N/A  05/17/2013    Procedure: ESOPHAGOGASTRODUODENOSCOPY (EGD) WITH PROPOFOL;  Surgeon: [name], MD;  Location: [facility];  Service: Endoscopy;  Laterality: N/A;  g junction is 42 cm  . Savory dilation N/A 05/17/2013    Procedure: SAVORY DILATION;  Surgeon: [name], MD;  Location: [facility];  Service: Endoscopy;  Laterality: N/A;  14/15/16  . Hemorrhoid banding N/A 05/17/2013    Procedure: HEMORRHOID BANDING;  Surgeon: [name], MD;  Location: [facility];  Service: Endoscopy;  Laterality: N/A;  2 bands placed  . Esophageal biopsy N/A 05/17/2013    Procedure: BIOPSY;  Surgeon: [name], MD;  Location: [facility];  Service: Endoscopy;  Laterality: N/A;   Family History  Problem Relation Age of Onset  . Adopted: Yes   History  Substance Use Topics  . Smoking status: Former Smoker -- 1.50 packs/day for 25 years    Types: Cigarettes    Quit date: 10/23/2007  . Smokeless tobacco: Never Used  . Alcohol Use: No   OB History   Grav Para Term Preterm Abortions TAB SAB Ect Mult Living   2 2 1 1      2      Review of Systems  Constitutional: Negative for fever, chills, activity change and appetite change.  Eyes: Negative for pain.  Respiratory: Positive for shortness of breath. Negative for chest tightness.   Cardiovascular: Negative for chest pain and leg swelling.  Gastrointestinal: Negative for nausea, vomiting, abdominal pain and diarrhea.  Genitourinary: Negative for flank pain.  Musculoskeletal: Negative for back pain and neck stiffness.  Skin: Positive for color change and wound. Negative for rash.  Neurological: Negative for weakness, numbness and headaches.  Psychiatric/Behavioral: Negative for behavioral problems.    Allergies  Codeine  Home Medications   Current Outpatient Rx  Name  Route  Sig  Dispense  Refill  . albuterol (PROAIR HFA) 108 (90 BASE) MCG/ACT inhaler   Inhalation   Inhale 2 puffs into the lungs every 6 (six) hours as needed for wheezing or  shortness of breath.         Marland Kitchen albuterol (PROVENTIL) (2.5 MG/3ML) 0.083% nebulizer solution   Nebulization   Take 2.5 mg by nebulization every 6 (six) hours as needed for wheezing or shortness of breath.         . ALPRAZolam (XANAX) 1 MG tablet   Oral   Take 1 mg by mouth 4 (four) times daily.           Marland Kitchen amLODipine (NORVASC) 10 MG tablet   Oral   Take 10 mg by mouth daily.         Marland Kitchen HYDROcodone-acetaminophen (NORCO) 10-325 MG per tablet   Oral   Take 1 tablet by mouth every 6 (six) hours as needed for pain.         Marland Kitchen insulin aspart (NOVOLOG) 100 UNIT/ML injection   Subcutaneous   Inject 10-20 Units into the skin 3 (three) times daily. Inject under skin 25 units before meals and hold if skips meal.  Do not give at bedtime  Correction AC (G-120) 2520         . insulin aspart (NOVOLOG) 100 UNIT/ML injection      Inject under skin 25 units before meals and hold if skips meal.  Do not give at bedtimeCorrection AC (G-120) 2520         . Insulin Glargine (LANTUS SOLOSTAR) 100 UNIT/ML SOPN   Subcutaneous   Inject 80 Units into the skin. Inject under skin 80 units at bedtime (divide into 2 equal injections)         . lisinopril-hydrochlorothiazide (PRINZIDE,ZESTORETIC) 20-12.5 MG per tablet   Oral   Take 1 tablet by mouth 2 (two) times daily.          . metFORMIN (GLUCOPHAGE) 1000 MG tablet   Oral   Take 1,000 mg by mouth 2 (two) times daily with a meal.         . omeprazole (PRILOSEC) 20 MG capsule   Oral   Take 20 mg by mouth 2 (two) times daily.          . predniSONE (DELTASONE) 10 MG tablet      5,4,3,2,1 - take with food   15 tablet   0   . rOPINIRole (REQUIP) 1 MG tablet   Oral   Take 1 mg by mouth at bedtime.          . simvastatin (ZOCOR) 40 MG tablet   Oral   Take 40 mg by mouth every evening.         Marland Kitchen UNKNOWN TO PATIENT   Topical   Apply 1 application topically daily as needed (for irritation (vaginal area) COMPOUND MEDICATION:  Ketoconazole mixed with another medication  (name is unknown)).          BP 182/107  Pulse 104  Temp(Src) 98.2 F (36.8 C) (Oral)  Resp 18  SpO2 99% Physical Exam  Constitutional: She is oriented to person,  place, and time.  Patient is obese  HENT:  Head: Normocephalic and atraumatic.  Cardiovascular: Normal rate and regular rhythm.   Pulmonary/Chest: Effort normal and breath sounds normal.  Abdominal: Soft. Bowel sounds are normal.  Musculoskeletal: She exhibits no edema.  Neurological: She is alert and oriented to person, place, and time.  Skin:  Approximately 10 x 15 cm erythematous area to lateral right breast towards the nipple. There is some induration. Centrally there is approximately a 4 x 7 cm firm mass. On the center of this there is an approximately 3 cm raised fluctuant area. There is some minimal purulent drainage.  Psychiatric: She has a normal mood and affect.    ED Course  INCISION AND DRAINAGE Date/Time: 05/29/2013 9:00 PM Performed by: Davonna Belling R. Authorized by: Mackie Pai Consent: Verbal consent obtained. written consent not obtained. Risks and benefits: risks, benefits and alternatives were discussed Consent given by: patient Patient understanding: patient states understanding of the procedure being performed Patient consent: the patient's understanding of the procedure matches consent given Relevant documents: relevant documents present and verified Test results: test results available and properly labeled Site marked: the operative site was marked Required items: required blood products, implants, devices, and special equipment available Patient identity confirmed: verbally with patient and arm band Time out: Immediately prior to procedure a "time out" was called to verify the correct patient, procedure, equipment, support staff and site/side marked as required. Type: abscess Body area: trunk Location details: right breast Patient  sedated: no Scalpel size: 11 Incision type: Single puncture with a scalpel. Complexity: simple Drainage: purulent Drainage amount: moderate Wound treatment: Wound left open. Irrigated with 40 cc of normal saline. Patient tolerance: Patient tolerated the procedure well with no immediate complications.   (including critical care time) Labs Review Labs Reviewed  GLUCOSE, CAPILLARY - Abnormal; Notable for the following:    Glucose-Capillary 547 (*)    All other components within normal limits  BASIC METABOLIC PANEL - Abnormal; Notable for the following:    Sodium 129 (*)    Chloride 95 (*)    Glucose, Bld 547 (*)    GFR calc non Af Amer 60 (*)    GFR calc Af Amer 69 (*)    All other components within normal limits  BLOOD GAS, VENOUS - Abnormal; Notable for the following:    pH, Ven 7.348 (*)    pO2, Ven 29.2 (*)    Bicarbonate 24.1 (*)    All other components within normal limits  URINALYSIS, ROUTINE W REFLEX MICROSCOPIC - Abnormal; Notable for the following:    Glucose, UA >1000 (*)    Hgb urine dipstick TRACE (*)    Protein, ur 100 (*)    All other components within normal limits  WOUND CULTURE  CULTURE, ROUTINE-ABSCESS  CBC WITH DIFFERENTIAL  PREGNANCY, URINE  URINE MICROSCOPIC-ADD ON   Imaging Review Dg Chest 2 View  05/28/2013   CLINICAL DATA:  Dyspnea for 2 weeks, history bronchitis  EXAM: CHEST  2 VIEW  COMPARISON:  08/30/2012  FINDINGS: Normal heart size, mediastinal contours, and pulmonary vascularity.  Lungs clear.  No pleural effusion or pneumothorax.  Bones unremarkable.  IMPRESSION: No acute abnormalities.   Electronically Signed   By: Lavonia Dana M.D.   On: 05/28/2013 00:46    EKG Interpretation   None       MDM   1. Breast abscess   2. Hyperglycemia   3. Diabetes mellitus, type 2    Patient  with breast abscess with cellulitis. Due to the desire for good cosmetic outcome the center fluctuant area was drained and irrigated but was not opened with a  large incision and was not packed. Patient be admitted for IV antibiotics. Patient is hyperglycemic but not in DKA.    Jasper Riling. Alvino Chapel, MD 05/29/13 2147

## 2013-05-29 NOTE — ED Notes (Signed)
CRITICAL VALUE ALERT  Critical value received:  PO2 29.2  Date of notification:  05/29/13  Time of notification:  2129  Critical value read back:yes  Nurse who received alert: Matilde Haymaker, RN  MD notified (1st page):  2129  Time of first page:  2129  Responding MD:  Dr. Alvino Chapel  Time MD responded:  2129

## 2013-05-29 NOTE — ED Notes (Signed)
Dr. Megan Salon notified that patient's blood sugar via CBG is 511. Dr. Megan Salon stated to start insulin drip. AC notified of need for insulin drip.

## 2013-05-29 NOTE — H&P (Signed)
[facility] History and Physical  Martha [name]  [id number]  DOB: Aug 24, 1965   DOA:05/29/2013   PCP:   Alonza Bogus, MD   Chief Complaint:  Uncontrolled blood sugar  HPI: Martha [name] is a 47 y.o. female.   Morbidly obese Caucasian, youngest child in her 34s, had incision and drainage of breast abscess in the emergency room today and was noted to have a markedly elevated blood sugar. Admission was requested for diabetic management. Reports blood sugars are usually controlled at home  Rewiew of Systems:   All systems negative except as marked bold or noted in the HPI;  Constitutional:    malaise, fever and chills. ;  Eyes:   eye pain, redness and discharge. ;  ENMT:   ear pain, hoarseness, nasal congestion, sinus pressure and sore throat. ;  Cardiovascular:    chest pain, palpitations, diaphoresis, dyspnea and peripheral edema.  Respiratory:   cough, hemoptysis, wheezing and stridor. ;  Gastrointestinal:  nausea, vomiting, diarrhea, constipation, abdominal pain, melena, blood in stool, hematemesis, jaundice and rectal bleeding. unusual weight loss..   Genitourinary:    frequency, dysuria, incontinence,flank pain and hematuria; Musculoskeletal:   back pain and neck pain.  swelling and trauma.;  Skin: .  pruritus, rash, abrasions, bruising and skin lesion.; ulcerations Neuro:    headache, lightheadedness and neck stiffness.  weakness, altered level of consciousness, altered mental status, extremity weakness, burning feet, involuntary movement, seizure and syncope.  Psych:    anxiety, depression, insomnia, tearfulness, panic attacks, hallucinations, paranoia, suicidal or homicidal ideation    Past Medical History  Diagnosis Date  . Diabetes mellitus   . Hypertension   . RLS (restless legs syndrome)   . Acid reflux   . Bipolar 1 disorder   . Depression   . OSA (obstructive sleep apnea)     cpap  . Anemia     Past Surgical History  Procedure Laterality Date  .  Tubal ligation    . Endometrial ablation    . Colonoscopy  06/06/2010    [id number] bleeding secondary to internal hemorrhoids but incomplete evaluation secondary to poor right colon prep/small rectal and sigmoid colon polyps (hyperplastic). PROPOFOL  . Esophagogastroduodenoscopy  06/06/2010    [id number] erythema and edema of body of stomach, with sessile polypoid lesions. bx benign. no h.pylori  . Colonoscopy  May 2013    Dr. Gilliam/[facility]: 5 mm a descending colon polyp, hyperplastic. Adequate bowel prep.  . Flexible sigmoidoscopy N/A 05/17/2013    Procedure: FLEXIBLE SIGMOIDOSCOPY WITH PROPOFOL;  Surgeon: [name], MD;  Location: [facility];  Service: Endoscopy;  Laterality: N/A;  . Esophagogastroduodenoscopy (egd) with propofol N/A 05/17/2013    Procedure: ESOPHAGOGASTRODUODENOSCOPY (EGD) WITH PROPOFOL;  Surgeon: [name], MD;  Location: [facility];  Service: Endoscopy;  Laterality: N/A;  g junction is 42 cm  . Savory dilation N/A 05/17/2013    Procedure: SAVORY DILATION;  Surgeon: [name], MD;  Location: [facility];  Service: Endoscopy;  Laterality: N/A;  14/15/16  . Hemorrhoid banding N/A 05/17/2013    Procedure: HEMORRHOID BANDING;  Surgeon: [name], MD;  Location: [facility];  Service: Endoscopy;  Laterality: N/A;  2 bands placed  . Esophageal biopsy N/A 05/17/2013    Procedure: BIOPSY;  Surgeon: [name], MD;  Location: [facility];  Service: Endoscopy;  Laterality: N/A;    Medications:  HOME MEDS: Prior to Admission medications   Medication Sig Start Date End Date Taking? Authorizing Provider  albuterol (  PROAIR HFA) 108 (90 BASE) MCG/ACT inhaler Inhale 2 puffs into the lungs every 6 (six) hours as needed for wheezing or shortness of breath.   Yes Historical Provider, MD  albuterol (PROVENTIL) (2.5 MG/3ML) 0.083% nebulizer solution Take 2.5 mg by nebulization every 6 (six) hours as needed for wheezing or shortness of breath.   Yes Historical Provider, MD  ALPRAZolam  Duanne Moron) 1 MG tablet Take 1 mg by mouth 4 (four) times daily.     Yes Historical Provider, MD  amLODipine (NORVASC) 10 MG tablet Take 10 mg by mouth daily.   Yes Historical Provider, MD  HYDROcodone-acetaminophen (NORCO) 10-325 MG per tablet Take 1 tablet by mouth every 6 (six) hours as needed for pain.   Yes Historical Provider, MD  insulin aspart (NOVOLOG) 100 UNIT/ML injection Inject 10-20 Units into the skin 3 (three) times daily. Inject under skin 25 units before meals and hold if skips meal.  Do not give at bedtime  Correction AC (G-120) 2520 03/02/13  Yes Historical Provider, MD  insulin aspart (NOVOLOG) 100 UNIT/ML injection Inject under skin 25 units before meals and hold if skips meal.  Do not give at bedtimeCorrection AC (G-120) 2520 03/02/13  Yes Historical Provider, MD  Insulin Glargine (LANTUS SOLOSTAR) 100 UNIT/ML SOPN Inject 80 Units into the skin. Inject under skin 80 units at bedtime (divide into 2 equal injections) 03/03/13  Yes Historical Provider, MD  lisinopril-hydrochlorothiazide (PRINZIDE,ZESTORETIC) 20-12.5 MG per tablet Take 1 tablet by mouth 2 (two) times daily.    Yes Historical Provider, MD  metFORMIN (GLUCOPHAGE) 1000 MG tablet Take 1,000 mg by mouth 2 (two) times daily with a meal.   Yes Historical Provider, MD  omeprazole (PRILOSEC) 20 MG capsule Take 20 mg by mouth 2 (two) times daily.    Yes Historical Provider, MD  predniSONE (DELTASONE) 10 MG tablet 5,4,3,2,1 - take with food 05/28/13  Yes Lenox Ahr, PA-C  rOPINIRole (REQUIP) 1 MG tablet Take 1 mg by mouth at bedtime.    Yes Historical Provider, MD  simvastatin (ZOCOR) 40 MG tablet Take 40 mg by mouth every evening.   Yes Historical Provider, MD  UNKNOWN TO PATIENT Apply 1 application topically daily as needed (for irritation (vaginal area) COMPOUND MEDICATION: Ketoconazole mixed with another medication  (name is unknown)).   Yes Historical Provider, MD     Allergies:  Allergies  Allergen Reactions  . Codeine  Nausea And Vomiting    Tolerates the medication if taken with food    Social History:   reports that she quit smoking about 5 years ago. Her smoking use included Cigarettes. She has a 37.5 pack-year smoking history. She has never used smokeless tobacco. She reports that she does not drink alcohol or use illicit drugs.  Family History: Family History  Problem Relation Age of Onset  . Adopted: Yes     Physical Exam: Filed Vitals:   05/29/13 1815  BP: 182/107  Pulse: 104  Temp: 98.2 F (36.8 C)  TempSrc: Oral  Resp: 18  SpO2: 99%   Blood pressure 182/107, pulse 104, temperature 98.2 F (36.8 C), temperature source Oral, resp. rate 18, SpO2 99.00%. There is no weight on file to calculate BMI.   GEN:  Pleasant morbidly obese Caucasian lady lying bed in no acute distress; cooperative with exam PSYCH:  alert and oriented x4; neither anxious nor depressed; affect is appropriate. HEENT: Mucous membranes pink and anicteric; PERRLA; EOM intact; no cervical lymphadenopathy nor thyromegaly or carotid bruit; no JVD;  Breasts:: Extensive erythema especially of the inferior outer quadrant of pendulous breasts; 2 inch diameter area of abscess with small puncture wound from the emergency room; no CHEST WALL: No tenderness CHEST: Normal respiration, clear to auscultation bilaterally HEART: Regular rate and rhythm; no murmurs rubs or gallops ABDOMEN: Obese, soft non-tender; no masses, no organomegaly, normal abdominal bowel sounds; large pannus; Rectal Exam: Not done EXTREMITIES: ; no edema; no ulcerations. Genitalia: not examined PULSES: 2+ and symmetric SKIN: Normal hydration no rash or ulceration other than breast CNS: Cranial nerves 2-12 grossly intact no focal lateralizing neurologic deficit   Labs on Admission:  Basic Metabolic Panel:  Recent Labs Lab 05/29/13 2015  NA 129*  K 3.9  CL 95*  CO2 24  GLUCOSE 547*  BUN 17  CREATININE 1.10  CALCIUM 9.0   Liver Function  Tests: No results found for this basename: AST, ALT, ALKPHOS, BILITOT, PROT, ALBUMIN,  in the last 168 hours No results found for this basename: LIPASE, AMYLASE,  in the last 168 hours No results found for this basename: AMMONIA,  in the last 168 hours CBC:  Recent Labs Lab 05/29/13 2015  WBC 8.6  NEUTROABS 5.2  HGB 13.2  HCT 38.3  MCV 85.5  PLT 227   Cardiac Enzymes: No results found for this basename: CKTOTAL, CKMB, CKMBINDEX, TROPONINI,  in the last 168 hours BNP: No components found with this basename: POCBNP,  D-dimer: No components found with this basename: D-DIMER,  CBG:  Recent Labs Lab 05/29/13 1818  [id number]*    Radiological Exams on Admission: Dg Chest 2 View  05/28/2013   CLINICAL DATA:  Dyspnea for 2 weeks, history bronchitis  EXAM: CHEST  2 VIEW  COMPARISON:  08/30/2012  FINDINGS: Normal heart size, mediastinal contours, and pulmonary vascularity.  Lungs clear.  No pleural effusion or pneumothorax.  Bones unremarkable.  IMPRESSION: No acute abnormalities.   Electronically Signed   By: Lavonia Dana M.D.   On: 05/28/2013 00:46     Assessment/Plan   Active Problems:   OBESITY   HYPERTENSION   Breast abscess   Diabetes mellitus, type 2   Hyperglycemia   PLAN: Intravenous antibiotics for a breast infection Intravenous insulin by glucose stabilizer Continue home medication  Other plans as per orders.  Code Status: Full code   Martha [name] Nocturnist [facility] Pager [phone]   05/29/2013, 10:06 PM

## 2013-05-29 NOTE — ED Notes (Signed)
Pt c/o abscess to right breast area for a week, denies any drainage

## 2013-05-30 DIAGNOSIS — E1165 Type 2 diabetes mellitus with hyperglycemia: Secondary | ICD-10-CM

## 2013-05-30 DIAGNOSIS — R739 Hyperglycemia, unspecified: Secondary | ICD-10-CM

## 2013-05-30 DIAGNOSIS — N611 Abscess of the breast and nipple: Secondary | ICD-10-CM

## 2013-05-30 DIAGNOSIS — E119 Type 2 diabetes mellitus without complications: Secondary | ICD-10-CM

## 2013-05-30 LAB — GLUCOSE, CAPILLARY
Glucose-Capillary: 316 mg/dL — ABNORMAL HIGH (ref 70–99)
Glucose-Capillary: 173 mg/dL — ABNORMAL HIGH (ref 70–99)
Glucose-Capillary: 187 mg/dL — ABNORMAL HIGH (ref 70–99)
Glucose-Capillary: 175 mg/dL — ABNORMAL HIGH (ref 70–99)
Glucose-Capillary: 174 mg/dL — ABNORMAL HIGH (ref 70–99)
Glucose-Capillary: 168 mg/dL — ABNORMAL HIGH (ref 70–99)
Glucose-Capillary: 210 mg/dL — ABNORMAL HIGH (ref 70–99)
Glucose-Capillary: 155 mg/dL — ABNORMAL HIGH (ref 70–99)

## 2013-05-30 LAB — CBC
HCT: 34.3 % — ABNORMAL LOW (ref 36.0–46.0)
Hemoglobin: 11.8 g/dL — ABNORMAL LOW (ref 12.0–15.0)
MCHC: 34.4 g/dL (ref 30.0–36.0)
Platelets: 192 10*3/uL (ref 150–400)
RDW: 14.5 % (ref 11.5–15.5)

## 2013-05-30 LAB — BASIC METABOLIC PANEL
BUN: 15 mg/dL (ref 6–23)
Calcium: 8.5 mg/dL (ref 8.4–10.5)
Chloride: 105 mEq/L (ref 96–112)
Creatinine, Ser: 1.1 mg/dL (ref 0.50–1.10)
GFR calc Af Amer: 69 mL/min — ABNORMAL LOW (ref >90)
GFR calc non Af Amer: 60 mL/min — ABNORMAL LOW (ref >90)

## 2013-05-30 LAB — HEMOGLOBIN A1C: Mean Plasma Glucose: 289 mg/dL — ABNORMAL HIGH (ref <117)

## 2013-05-30 MED ORDER — AMLODIPINE BESYLATE 5 MG PO TABS
10.0000 mg | ORAL_TABLET | Freq: Every day | ORAL | Status: DC
  Administered 2013-05-30 – 2013-06-02 (×4): 10 mg via ORAL
  Filled 2013-05-30 (×4): qty 2

## 2013-05-30 MED ORDER — SODIUM CHLORIDE 0.9 % IV SOLN
INTRAVENOUS | Status: DC
  Administered 2013-05-30: 150 mL/h via INTRAVENOUS

## 2013-05-30 MED ORDER — ALUM & MAG HYDROXIDE-SIMETH 200-200-20 MG/5ML PO SUSP
30.0000 mL | ORAL | Status: DC | PRN
  Administered 2013-05-30: 30 mL via ORAL
  Filled 2013-05-30: qty 30

## 2013-05-30 MED ORDER — INSULIN REGULAR BOLUS VIA INFUSION
0.0000 [IU] | Freq: Three times a day (TID) | INTRAVENOUS | Status: DC
  Filled 2013-05-30: qty 10

## 2013-05-30 MED ORDER — RISAQUAD PO CAPS
2.0000 | ORAL_CAPSULE | Freq: Every day | ORAL | Status: DC
  Administered 2013-05-30 – 2013-06-02 (×4): 2 via ORAL
  Filled 2013-05-30 (×5): qty 2

## 2013-05-30 MED ORDER — HYDROCHLOROTHIAZIDE 12.5 MG PO CAPS
12.5000 mg | ORAL_CAPSULE | Freq: Every day | ORAL | Status: DC
  Administered 2013-05-30: 12.5 mg via ORAL
  Filled 2013-05-30: qty 1

## 2013-05-30 MED ORDER — INSULIN ASPART 100 UNIT/ML ~~LOC~~ SOLN
0.0000 [IU] | Freq: Three times a day (TID) | SUBCUTANEOUS | Status: DC
  Administered 2013-05-30 (×2): 4 [IU] via SUBCUTANEOUS
  Administered 2013-05-31: 15 [IU] via SUBCUTANEOUS
  Administered 2013-05-31: 20 [IU] via SUBCUTANEOUS
  Administered 2013-05-31: 11 [IU] via SUBCUTANEOUS
  Administered 2013-06-01: 12 [IU] via SUBCUTANEOUS
  Administered 2013-06-01: 20 [IU] via SUBCUTANEOUS
  Administered 2013-06-01: 4 [IU] via SUBCUTANEOUS
  Administered 2013-06-02: 15 [IU] via SUBCUTANEOUS
  Administered 2013-06-02: 11 [IU] via SUBCUTANEOUS

## 2013-05-30 MED ORDER — LISINOPRIL 10 MG PO TABS
20.0000 mg | ORAL_TABLET | Freq: Every day | ORAL | Status: DC
  Administered 2013-05-30: 20 mg via ORAL
  Filled 2013-05-30: qty 2

## 2013-05-30 MED ORDER — INSULIN GLARGINE 100 UNIT/ML ~~LOC~~ SOLN
40.0000 [IU] | Freq: Two times a day (BID) | SUBCUTANEOUS | Status: DC
  Administered 2013-05-30 (×2): 40 [IU] via SUBCUTANEOUS
  Filled 2013-05-30 (×3): qty 0.4

## 2013-05-30 MED ORDER — DEXTROSE 50 % IV SOLN: 25.0000 mL | INTRAVENOUS | Status: DC | PRN

## 2013-05-30 MED ORDER — ROPINIROLE HCL 1 MG PO TABS
1.0000 mg | ORAL_TABLET | Freq: Every day | ORAL | Status: DC
  Administered 2013-05-30 – 2013-06-01 (×4): 1 mg via ORAL
  Filled 2013-05-30 (×5): qty 1

## 2013-05-30 MED ORDER — INSULIN ASPART 100 UNIT/ML ~~LOC~~ SOLN
0.0000 [IU] | Freq: Every day | SUBCUTANEOUS | Status: DC
  Administered 2013-06-01: 4 [IU] via SUBCUTANEOUS

## 2013-05-30 MED ORDER — INSULIN ASPART 100 UNIT/ML ~~LOC~~ SOLN
8.0000 [IU] | Freq: Three times a day (TID) | SUBCUTANEOUS | Status: DC
  Administered 2013-05-30 (×2): 8 [IU] via SUBCUTANEOUS

## 2013-05-30 MED ORDER — PANTOPRAZOLE SODIUM 40 MG IV SOLR
40.0000 mg | Freq: Once | INTRAVENOUS | Status: AC
  Administered 2013-05-30: 40 mg via INTRAVENOUS
  Filled 2013-05-30: qty 40

## 2013-05-30 MED ORDER — HEPARIN SODIUM (PORCINE) 5000 UNIT/ML IJ SOLN
5000.0000 [IU] | Freq: Three times a day (TID) | INTRAMUSCULAR | Status: DC
  Administered 2013-05-30 – 2013-06-01 (×8): 5000 [IU] via SUBCUTANEOUS
  Filled 2013-05-30 (×8): qty 1

## 2013-05-30 MED ORDER — SODIUM CHLORIDE 0.9 % IJ SOLN
3.0000 mL | Freq: Two times a day (BID) | INTRAMUSCULAR | Status: DC
  Administered 2013-05-30 – 2013-06-02 (×6): 3 mL via INTRAVENOUS

## 2013-05-30 MED ORDER — DEXTROSE-NACL 5-0.45 % IV SOLN
INTRAVENOUS | Status: DC
  Administered 2013-05-30: 05:00:00 via INTRAVENOUS

## 2013-05-30 MED ORDER — ACETAMINOPHEN 325 MG PO TABS
650.0000 mg | ORAL_TABLET | ORAL | Status: DC | PRN
  Administered 2013-05-30: 650 mg via ORAL
  Filled 2013-05-30: qty 2

## 2013-05-30 MED ORDER — SORBITOL 70 % SOLN: 30.0000 mL | Freq: Every day | Status: DC | PRN

## 2013-05-30 MED ORDER — LISINOPRIL-HYDROCHLOROTHIAZIDE 20-12.5 MG PO TABS: 1.0000 | ORAL_TABLET | Freq: Two times a day (BID) | ORAL | Status: DC

## 2013-05-30 MED ORDER — FLEET ENEMA 7-19 GM/118ML RE ENEM: 1.0000 | ENEMA | Freq: Once | RECTAL | Status: AC | PRN

## 2013-05-30 MED ORDER — INSULIN ASPART 100 UNIT/ML ~~LOC~~ SOLN
5.0000 [IU] | Freq: Once | SUBCUTANEOUS | Status: AC
  Administered 2013-05-30: 5 [IU] via SUBCUTANEOUS

## 2013-05-30 MED ORDER — CLINDAMYCIN PHOSPHATE 600 MG/50ML IV SOLN
INTRAVENOUS | Status: AC
  Filled 2013-05-30: qty 50

## 2013-05-30 MED ORDER — PANTOPRAZOLE SODIUM 40 MG PO TBEC
40.0000 mg | DELAYED_RELEASE_TABLET | Freq: Two times a day (BID) | ORAL | Status: DC
  Administered 2013-05-30 – 2013-06-02 (×7): 40 mg via ORAL
  Filled 2013-05-30 (×7): qty 1

## 2013-05-30 MED ORDER — ALBUTEROL SULFATE (5 MG/ML) 0.5% IN NEBU
2.5000 mg | INHALATION_SOLUTION | Freq: Four times a day (QID) | RESPIRATORY_TRACT | Status: DC | PRN
  Administered 2013-05-30 – 2013-05-31 (×3): 2.5 mg via RESPIRATORY_TRACT
  Filled 2013-05-30 (×3): qty 0.5

## 2013-05-30 MED ORDER — SODIUM CHLORIDE 0.9 % IV SOLN
INTRAVENOUS | Status: DC
  Administered 2013-05-30: 8.7 [IU]/h via INTRAVENOUS
  Filled 2013-05-30: qty 1

## 2013-05-30 MED ORDER — SIMVASTATIN 20 MG PO TABS
40.0000 mg | ORAL_TABLET | Freq: Every evening | ORAL | Status: DC
  Administered 2013-05-30 – 2013-06-01 (×3): 40 mg via ORAL
  Filled 2013-05-30 (×3): qty 2

## 2013-05-30 MED ORDER — ALPRAZOLAM 1 MG PO TABS
1.0000 mg | ORAL_TABLET | Freq: Four times a day (QID) | ORAL | Status: DC
  Administered 2013-05-30 – 2013-06-02 (×15): 1 mg via ORAL
  Filled 2013-05-30: qty 1
  Filled 2013-05-30 (×2): qty 2
  Filled 2013-05-30 (×8): qty 1
  Filled 2013-05-30: qty 2
  Filled 2013-05-30 (×3): qty 1

## 2013-05-30 MED ORDER — CLINDAMYCIN PHOSPHATE 600 MG/50ML IV SOLN
600.0000 mg | Freq: Three times a day (TID) | INTRAVENOUS | Status: DC
  Administered 2013-05-30 – 2013-06-02 (×11): 600 mg via INTRAVENOUS
  Filled 2013-05-30 (×14): qty 50

## 2013-05-30 MED ORDER — ONDANSETRON HCL 4 MG/2ML IJ SOLN: 4.0000 mg | INTRAMUSCULAR | Status: DC | PRN

## 2013-05-30 MED ORDER — ROPINIROLE HCL 1 MG PO TABS
ORAL_TABLET | ORAL | Status: AC
  Filled 2013-05-30: qty 1

## 2013-05-30 MED ORDER — ALPRAZOLAM 0.5 MG PO TABS
1.0000 mg | ORAL_TABLET | Freq: Four times a day (QID) | ORAL | Status: DC
Start: 2013-05-30 — End: 2013-05-30

## 2013-05-30 NOTE — Progress Notes (Signed)
UR chart review completed.  

## 2013-05-30 NOTE — Telephone Encounter (Signed)
PT returned call and was informed. She said to cancel her appt for 06/01/2013. She is in the hospital now. She will have to reschedule later. Routing to [place] to cancel.

## 2013-05-30 NOTE — Progress Notes (Signed)
Pt to be transferred to floor per MD order. Report called to RN. Pt transferred via wheelchair with personal belongings.

## 2013-05-30 NOTE — Progress Notes (Signed)
Subjective: She was admitted last night with uncontrolled diabetes, bronchitis and a breast abscess. Her last for blood sugars have been in range  Objective: Vital signs in last 24 hours: Temp:  [98.1 F (36.7 C)-98.6 F (37 C)] 98.6 F (37 C) (11/03 0731) Pulse Rate:  [81-104] 81 (11/03 0600) Resp:  [10-20] 12 (11/03 0600) BP: (114-182)/(55-107) 114/55 mmHg (11/03 0600) SpO2:  [97 %-100 %] 97 % (11/03 0600) Weight:  [169.4 kg (373 lb 7.4 oz)] 169.4 kg (373 lb 7.4 oz) (11/02 2315) Weight change:  Last BM Date: 05/29/13  Intake/Output from previous day: 11/02 0701 - 11/03 0700 In: 710 [I.V.:660; IV Piggyback:50] Out: 400 [Urine:400]  PHYSICAL EXAM General appearance: alert, cooperative, mild distress and morbidly obese Resp: rhonchi bilaterally Cardio: regular rate and rhythm, S1, S2 normal, no murmur, click, rub or gallop GI: soft, non-tender; bowel sounds normal; no masses,  no organomegaly Extremities: extremities normal, atraumatic, no cyanosis or edema  Lab Results:    Basic Metabolic Panel:  Recent Labs  05/29/13 2015 05/30/13 0455  NA 129* 139  K 3.9 3.2*  CL 95* 105  CO2 24 26  GLUCOSE 547* 173*  BUN 17 15  CREATININE 1.10 1.10  CALCIUM 9.0 8.5   Liver Function Tests: No results found for this basename: AST, ALT, ALKPHOS, BILITOT, PROT, ALBUMIN,  in the last 72 hours No results found for this basename: LIPASE, AMYLASE,  in the last 72 hours No results found for this basename: AMMONIA,  in the last 72 hours CBC:  Recent Labs  05/29/13 2015 05/30/13 0455  WBC 8.6 8.0  NEUTROABS 5.2  --   HGB 13.2 11.8*  HCT 38.3 34.3*  MCV 85.5 85.3  PLT 227 192   Cardiac Enzymes: No results found for this basename: CKTOTAL, CKMB, CKMBINDEX, TROPONINI,  in the last 72 hours BNP: No results found for this basename: PROBNP,  in the last 72 hours D-Dimer: No results found for this basename: DDIMER,  in the last 72 hours CBG:  Recent Labs  05/30/13 0237  05/30/13 0331 05/30/13 0438 05/30/13 0528 05/30/13 0623 05/30/13 0731  GLUCAP 278* 261* 187* 173* 175* 174*   Hemoglobin A1C: No results found for this basename: HGBA1C,  in the last 72 hours Fasting Lipid Panel: No results found for this basename: CHOL, HDL, LDLCALC, TRIG, CHOLHDL, LDLDIRECT,  in the last 72 hours Thyroid Function Tests: No results found for this basename: TSH, T4TOTAL, FREET4, T3FREE, THYROIDAB,  in the last 72 hours Anemia Panel: No results found for this basename: VITAMINB12, FOLATE, FERRITIN, TIBC, IRON, RETICCTPCT,  in the last 72 hours Coagulation: No results found for this basename: LABPROT, INR,  in the last 72 hours Urine Drug Screen: Drugs of Abuse  No results found for this basename: labopia, cocainscrnur, labbenz, amphetmu, thcu, labbarb    Alcohol Level: No results found for this basename: ETH,  in the last 72 hours Urinalysis:  Recent Labs  05/29/13 2017  COLORURINE YELLOW  LABSPEC 1.020  PHURINE 6.5  GLUCOSEU >1000*  HGBUR TRACE*  BILIRUBINUR NEGATIVE  KETONESUR NEGATIVE  PROTEINUR 100*  UROBILINOGEN 0.2  NITRITE NEGATIVE  LEUKOCYTESUR NEGATIVE   Misc. Labs:  ABGS  Recent Labs  05/29/13 2110  TCO2 22.0  HCO3 24.1*   CULTURES Recent Results (from the past 240 hour(s))  MRSA PCR SCREENING     Status: None   Collection Time    05/29/13 11:12 PM      Result Value Range Status  MRSA by PCR NEGATIVE  NEGATIVE Final   Comment:            The GeneXpert MRSA Assay (FDA     approved for NASAL specimens     only), is one component of a     comprehensive MRSA colonization     surveillance program. It is not     intended to diagnose MRSA     infection nor to guide or     monitor treatment for     MRSA infections.   Studies/Results: No results found.  Medications:  Prior to Admission:  Prescriptions prior to admission  Medication Sig Dispense Refill  . albuterol (PROAIR HFA) 108 (90 BASE) MCG/ACT inhaler Inhale 2 puffs  into the lungs every 6 (six) hours as needed for wheezing or shortness of breath.      Marland Kitchen albuterol (PROVENTIL) (2.5 MG/3ML) 0.083% nebulizer solution Take 2.5 mg by nebulization every 6 (six) hours as needed for wheezing or shortness of breath.      . ALPRAZolam (XANAX) 1 MG tablet Take 1 mg by mouth 4 (four) times daily.        Marland Kitchen amLODipine (NORVASC) 10 MG tablet Take 10 mg by mouth daily.      Marland Kitchen HYDROcodone-acetaminophen (NORCO) 10-325 MG per tablet Take 1 tablet by mouth every 6 (six) hours as needed for pain.      Marland Kitchen insulin aspart (NOVOLOG) 100 UNIT/ML injection Inject 10-20 Units into the skin 3 (three) times daily. Inject under skin 25 units before meals and hold if skips meal.  Do not give at bedtime  Correction AC (G-120) 2520      . insulin aspart (NOVOLOG) 100 UNIT/ML injection Inject under skin 25 units before meals and hold if skips meal.  Do not give at bedtimeCorrection AC (G-120) 2520      . Insulin Glargine (LANTUS SOLOSTAR) 100 UNIT/ML SOPN Inject 80 Units into the skin. Inject under skin 80 units at bedtime (divide into 2 equal injections)      . lisinopril-hydrochlorothiazide (PRINZIDE,ZESTORETIC) 20-12.5 MG per tablet Take 1 tablet by mouth 2 (two) times daily.       . metFORMIN (GLUCOPHAGE) 1000 MG tablet Take 1,000 mg by mouth 2 (two) times daily with a meal.      . omeprazole (PRILOSEC) 20 MG capsule Take 20 mg by mouth 2 (two) times daily.       . predniSONE (DELTASONE) 10 MG tablet 5,4,3,2,1 - take with food  15 tablet  0  . rOPINIRole (REQUIP) 1 MG tablet Take 1 mg by mouth at bedtime.       . simvastatin (ZOCOR) 40 MG tablet Take 40 mg by mouth every evening.      Marland Kitchen UNKNOWN TO PATIENT Apply 1 application topically daily as needed (for irritation (vaginal area) COMPOUND MEDICATION: Ketoconazole mixed with another medication  (name is unknown)).       Scheduled: . acidophilus  2 capsule Oral Daily  . ALPRAZolam  1 mg Oral QID  . amLODipine  10 mg Oral Daily  . clindamycin  (CLEOCIN) IV  600 mg Intravenous Q8H  . heparin  5,000 Units Subcutaneous Q8H  . lisinopril  20 mg Oral Daily   And  . hydrochlorothiazide  12.5 mg Oral Daily  . insulin regular  0-10 Units Intravenous TID WC  . pantoprazole  40 mg Oral BID AC  . rOPINIRole  1 mg Oral QHS  . simvastatin  40 mg Oral QPM  .  sodium chloride  3 mL Intravenous Q12H   Continuous: . sodium chloride Stopped (05/30/13 0448)  . dextrose 5 % and 0.45% NaCl 75 mL/hr at 05/30/13 0448  . insulin (NOVOLIN-R) infusion 5.7 Units/hr (05/30/13 0730)   [id number], albuterol, alum & mag hydroxide-simeth, dextrose, ondansetron (ZOFRAN) IV, sodium phosphate, sorbitol  Assesment: She has a breast abscess. She has bronchitis and has uncontrolled diabetes. Her blood sugar has not been very well controlled as an outpatient. She had seen an endocrinologist here and was dissatisfied with her care so she has been sent to [facility] but has not made that appointment yet. She's hoping to have obesity surgery but we have to have her blood sugar better controlled. Active Problems:   OBESITY   HYPERTENSION   Breast abscess   Diabetes mellitus, type 2   Hyperglycemia    Plan: Transfer out of ICU switch her to basal insulin and sliding scale    LOS: 1 day   Ardeth Repetto L 05/30/2013, 8:05 AM

## 2013-05-30 NOTE — Telephone Encounter (Signed)
OV cancelled °

## 2013-05-30 NOTE — Telephone Encounter (Signed)
LMOM to call.

## 2013-05-31 LAB — GLUCOSE, CAPILLARY
Glucose-Capillary: 480 mg/dL — ABNORMAL HIGH (ref 70–99)
Glucose-Capillary: 312 mg/dL — ABNORMAL HIGH (ref 70–99)
Glucose-Capillary: 291 mg/dL — ABNORMAL HIGH (ref 70–99)

## 2013-05-31 MED ORDER — HYDROCHLOROTHIAZIDE 12.5 MG PO CAPS
12.5000 mg | ORAL_CAPSULE | Freq: Every day | ORAL | Status: DC
  Administered 2013-05-31 – 2013-06-02 (×3): 12.5 mg via ORAL
  Filled 2013-05-31 (×3): qty 1

## 2013-05-31 MED ORDER — INSULIN GLARGINE 100 UNIT/ML ~~LOC~~ SOLN
45.0000 [IU] | Freq: Two times a day (BID) | SUBCUTANEOUS | Status: DC
  Administered 2013-05-31 (×2): 45 [IU] via SUBCUTANEOUS
  Filled 2013-05-31 (×3): qty 0.45

## 2013-05-31 MED ORDER — INSULIN ASPART 100 UNIT/ML ~~LOC~~ SOLN
5.0000 [IU] | Freq: Once | SUBCUTANEOUS | Status: AC
  Administered 2013-05-31: 5 [IU] via SUBCUTANEOUS

## 2013-05-31 MED ORDER — HYDROCODONE-ACETAMINOPHEN 10-325 MG PO TABS
1.0000 | ORAL_TABLET | ORAL | Status: DC | PRN
  Administered 2013-06-01 – 2013-06-02 (×2): 1 via ORAL
  Filled 2013-05-31 (×2): qty 1

## 2013-05-31 MED ORDER — LISINOPRIL 10 MG PO TABS
40.0000 mg | ORAL_TABLET | Freq: Every day | ORAL | Status: DC
  Administered 2013-05-31 – 2013-06-02 (×3): 40 mg via ORAL
  Filled 2013-05-31 (×3): qty 4

## 2013-05-31 MED ORDER — INSULIN ASPART 100 UNIT/ML ~~LOC~~ SOLN
12.0000 [IU] | Freq: Three times a day (TID) | SUBCUTANEOUS | Status: DC
  Administered 2013-05-31 (×2): 12 [IU] via SUBCUTANEOUS

## 2013-05-31 NOTE — Progress Notes (Signed)
Subjective: She says she feels somewhat better. She still having some drainage from her right breast abscess. Her blood sugar has been fairly markedly elevated this morning. Her blood pressure has been up. She has some pain in her breast but it is not severe  Objective: Vital signs in last 24 hours: Temp:  [97.7 F (36.5 C)-98.8 F (37.1 C)] 97.7 F (36.5 C) (11/04 0509) Pulse Rate:  [84-87] 84 (11/04 0509) Resp:  [20] 20 (11/04 0509) BP: (122-155)/(72-101) 155/101 mmHg (11/04 0509) SpO2:  [95 %-100 %] 98 % (11/04 0509) Weight:  [168.9 kg (372 lb 5.7 oz)] 168.9 kg (372 lb 5.7 oz) (11/04 0509) Weight change: -0.5 kg (-1 lb 1.6 oz) Last BM Date: 05/29/13  Intake/Output from previous day: 11/03 0701 - 11/04 0700 In: 1216.3 [P.O.:730; I.V.:336.3; IV Piggyback:150] Out: 1300 [Urine:1300]  PHYSICAL EXAM General appearance: alert, cooperative and morbidly obese Resp: clear to auscultation bilaterally Cardio: regular rate and rhythm, S1, S2 normal, no murmur, click, rub or gallop GI: soft, non-tender; bowel sounds normal; no masses,  no organomegaly Extremities: extremities normal, atraumatic, no cyanosis or edema She has drainage from the right breast abscess with surrounding erythema and cellulitis  Lab Results:    Basic Metabolic Panel:  Recent Labs  05/29/13 2015 05/30/13 0455 05/30/13 2156  NA 129* 139  --   K 3.9 3.2*  --   CL 95* 105  --   CO2 24 26  --   GLUCOSE 547* 173* 445*  BUN 17 15  --   CREATININE 1.10 1.10  --   CALCIUM 9.0 8.5  --    Liver Function Tests: No results found for this basename: AST, ALT, ALKPHOS, BILITOT, PROT, ALBUMIN,  in the last 72 hours No results found for this basename: LIPASE, AMYLASE,  in the last 72 hours No results found for this basename: AMMONIA,  in the last 72 hours CBC:  Recent Labs  05/29/13 2015 05/30/13 0455  WBC 8.6 8.0  NEUTROABS 5.2  --   HGB 13.2 11.8*  HCT 38.3 34.3*  MCV 85.5 85.3  PLT 227 192   Cardiac  Enzymes: No results found for this basename: CKTOTAL, CKMB, CKMBINDEX, TROPONINI,  in the last 72 hours BNP: No results found for this basename: PROBNP,  in the last 72 hours D-Dimer: No results found for this basename: DDIMER,  in the last 72 hours CBG:  Recent Labs  05/30/13 0838 05/30/13 0926 05/30/13 1024 05/30/13 1126 05/30/13 1651 05/30/13 2108  GLUCAP 168* 210* 174* 155* 252* 435*   Hemoglobin A1C:  Recent Labs  05/30/13 0455  HGBA1C 11.7*   Fasting Lipid Panel: No results found for this basename: CHOL, HDL, LDLCALC, TRIG, CHOLHDL, LDLDIRECT,  in the last 72 hours Thyroid Function Tests: No results found for this basename: TSH, T4TOTAL, FREET4, T3FREE, THYROIDAB,  in the last 72 hours Anemia Panel: No results found for this basename: VITAMINB12, FOLATE, FERRITIN, TIBC, IRON, RETICCTPCT,  in the last 72 hours Coagulation: No results found for this basename: LABPROT, INR,  in the last 72 hours Urine Drug Screen: Drugs of Abuse  No results found for this basename: labopia, cocainscrnur, labbenz, amphetmu, thcu, labbarb    Alcohol Level: No results found for this basename: ETH,  in the last 72 hours Urinalysis:  Recent Labs  05/29/13 2017  COLORURINE YELLOW  LABSPEC 1.020  PHURINE 6.5  GLUCOSEU >1000*  HGBUR TRACE*  BILIRUBINUR NEGATIVE  KETONESUR NEGATIVE  PROTEINUR 100*  UROBILINOGEN 0.2  NITRITE  NEGATIVE  LEUKOCYTESUR NEGATIVE   Misc. Labs:  ABGS  Recent Labs  05/29/13 2110  TCO2 22.0  HCO3 24.1*   CULTURES Recent Results (from the past 240 hour(s))  CULTURE, ROUTINE-ABSCESS     Status: None   Collection Time    05/29/13  8:30 PM      Result Value Range Status   Specimen Description ABSCESS BREAST   Final   Special Requests NONE   Final   Gram Stain     Final   Value: ABUNDANT WBC PRESENT, PREDOMINANTLY PMN     NO SQUAMOUS EPITHELIAL CELLS SEEN     ABUNDANT GRAM POSITIVE COCCI     IN PAIRS FEW GRAM NEGATIVE RODS     Performed at  [facility]   Culture PENDING   Incomplete   Report Status PENDING   Incomplete  MRSA PCR SCREENING     Status: None   Collection Time    05/29/13 11:12 PM      Result Value Range Status   MRSA by PCR NEGATIVE  NEGATIVE Final   Comment:            The GeneXpert MRSA Assay (FDA     approved for NASAL specimens     only), is one component of a     comprehensive MRSA colonization     surveillance program. It is not     intended to diagnose MRSA     infection nor to guide or     monitor treatment for     MRSA infections.   Studies/Results: No results found.  Medications:  Prior to Admission:  Prescriptions prior to admission  Medication Sig Dispense Refill  . albuterol (PROAIR HFA) 108 (90 BASE) MCG/ACT inhaler Inhale 2 puffs into the lungs every 6 (six) hours as needed for wheezing or shortness of breath.      Marland Kitchen albuterol (PROVENTIL) (2.5 MG/3ML) 0.083% nebulizer solution Take 2.5 mg by nebulization every 6 (six) hours as needed for wheezing or shortness of breath.      . ALPRAZolam (XANAX) 1 MG tablet Take 1 mg by mouth 4 (four) times daily.        Marland Kitchen amLODipine (NORVASC) 10 MG tablet Take 10 mg by mouth daily.      Marland Kitchen HYDROcodone-acetaminophen (NORCO) 10-325 MG per tablet Take 1 tablet by mouth every 6 (six) hours as needed for pain.      Marland Kitchen insulin aspart (NOVOLOG) 100 UNIT/ML injection Inject 10-20 Units into the skin 3 (three) times daily. Inject under skin 25 units before meals and hold if skips meal.  Do not give at bedtime  Correction AC (G-120) 2520      . insulin aspart (NOVOLOG) 100 UNIT/ML injection Inject under skin 25 units before meals and hold if skips meal.  Do not give at bedtimeCorrection AC (G-120) 2520      . Insulin Glargine (LANTUS SOLOSTAR) 100 UNIT/ML SOPN Inject 80 Units into the skin. Inject under skin 80 units at bedtime (divide into 2 equal injections)      . lisinopril-hydrochlorothiazide (PRINZIDE,ZESTORETIC) 20-12.5 MG per tablet Take 1 tablet by  mouth 2 (two) times daily.       . metFORMIN (GLUCOPHAGE) 1000 MG tablet Take 1,000 mg by mouth 2 (two) times daily with a meal.      . omeprazole (PRILOSEC) 20 MG capsule Take 20 mg by mouth 2 (two) times daily.       . predniSONE (DELTASONE) 10 MG tablet 5,4,3,2,1 -  take with food  15 tablet  0  . rOPINIRole (REQUIP) 1 MG tablet Take 1 mg by mouth at bedtime.       . simvastatin (ZOCOR) 40 MG tablet Take 40 mg by mouth every evening.      Marland Kitchen UNKNOWN TO PATIENT Apply 1 application topically daily as needed (for irritation (vaginal area) COMPOUND MEDICATION: Ketoconazole mixed with another medication  (name is unknown)).       Scheduled: . acidophilus  2 capsule Oral Daily  . ALPRAZolam  1 mg Oral QID  . amLODipine  10 mg Oral Daily  . clindamycin (CLEOCIN) IV  600 mg Intravenous Q8H  . heparin  5,000 Units Subcutaneous Q8H  . lisinopril  20 mg Oral Daily   And  . hydrochlorothiazide  12.5 mg Oral Daily  . insulin aspart  0-20 Units Subcutaneous TID WC  . insulin aspart  0-5 Units Subcutaneous QHS  . insulin aspart  12 Units Subcutaneous TID WC  . insulin glargine  45 Units Subcutaneous BID  . pantoprazole  40 mg Oral BID AC  . rOPINIRole  1 mg Oral QHS  . simvastatin  40 mg Oral QPM  . sodium chloride  3 mL Intravenous Q12H   Continuous:  [id number], albuterol, alum & mag hydroxide-simeth, dextrose, HYDROcodone-acetaminophen, ondansetron (ZOFRAN) IV, sorbitol  Assesment: She has a breast abscess. Her blood sugar is uncontrolled and I think that's probably related to the abscess but she has had very difficult time getting her blood sugar controlled and he weighed. Her blood pressure is up. She has morbid obesity which is I think the basis of most of her medical problems Active Problems:   OBESITY   HYPERTENSION   Breast abscess   Diabetes mellitus, type 2   Hyperglycemia    Plan: I have modified her insulin treatment. I will modify her blood pressure medications.     LOS: 2 days   Queen Abbett L 05/31/2013, 8:41 AM

## 2013-05-31 NOTE — Care Management Note (Addendum)
    Page 1 of 1   06/02/2013     1:56:54 PM   CARE MANAGEMENT NOTE 06/02/2013  Patient:  Martha [name], Martha [name]   Account Number:  [id number]  Date Initiated:  05/31/2013  Documentation initiated by:  Theophilus Kinds  Subjective/Objective Assessment:   Pt admitted from home with breast abcess and hyperglycemia. Pt lives with her boyfriend and children and pt will return home at discharge. Pt has cpap, showe chair, and walker for home use. Pt would like w/c at discharge. Pt fairly     Action/Plan:   independent with ADL's at discharge. Will arrange w/c with [facility] at discharge when orders written.   Anticipated DC Date:  06/01/2013   Anticipated DC Plan:  [place]  CM consult      Choice offered to / List presented to:             Status of service:  Completed, signed off Medicare Important Message given?   (If response is "NO", the following Medicare IM given date fields will be blank) Date Medicare IM given:   Date Additional Medicare IM given:    Discharge Disposition:  HOME/SELF CARE  Per UR Regulation:    If discussed at Long Length of Stay Meetings, dates discussed:    Comments:  05/31/13 [place], RN BSN CM

## 2013-06-01 ENCOUNTER — Encounter (HOSPITAL_COMMUNITY): Payer: Medicaid Other | Admitting: Anesthesiology

## 2013-06-01 ENCOUNTER — Ambulatory Visit: Payer: Medicaid Other | Admitting: Gastroenterology

## 2013-06-01 ENCOUNTER — Inpatient Hospital Stay (HOSPITAL_COMMUNITY): Payer: Medicaid Other | Admitting: Anesthesiology

## 2013-06-01 ENCOUNTER — Encounter (HOSPITAL_COMMUNITY)
Admission: EM | Disposition: A | Discharge status: Home Health | Payer: Self-pay | Source: Home / Self Care | Attending: Pulmonary Disease

## 2013-06-01 ENCOUNTER — Encounter (HOSPITAL_COMMUNITY): Payer: Self-pay | Admitting: *Deleted

## 2013-06-01 HISTORY — PX: IRRIGATION AND DEBRIDEMENT ABSCESS: SHX5252

## 2013-06-01 LAB — GLUCOSE, CAPILLARY
Glucose-Capillary: 362 mg/dL — ABNORMAL HIGH (ref 70–99)
Glucose-Capillary: 176 mg/dL — ABNORMAL HIGH (ref 70–99)

## 2013-06-01 SURGERY — IRRIGATION AND DEBRIDEMENT ABSCESS
Anesthesia: General | Site: Breast | Laterality: Right | Wound class: Dirty or Infected

## 2013-06-01 MED ORDER — ONDANSETRON HCL 4 MG/2ML IJ SOLN: 4.0000 mg | Freq: Once | INTRAMUSCULAR | Status: DC | PRN

## 2013-06-01 MED ORDER — MIDAZOLAM HCL 2 MG/2ML IJ SOLN
INTRAMUSCULAR | Status: AC
  Filled 2013-06-01: qty 2

## 2013-06-01 MED ORDER — FENTANYL CITRATE 0.05 MG/ML IJ SOLN
INTRAMUSCULAR | Status: AC
  Filled 2013-06-01: qty 5

## 2013-06-01 MED ORDER — INSULIN GLARGINE 100 UNIT/ML ~~LOC~~ SOLN
50.0000 [IU] | Freq: Two times a day (BID) | SUBCUTANEOUS | Status: DC
  Administered 2013-06-01 – 2013-06-02 (×3): 50 [IU] via SUBCUTANEOUS
  Filled 2013-06-01 (×5): qty 0.5

## 2013-06-01 MED ORDER — SUCCINYLCHOLINE CHLORIDE 20 MG/ML IJ SOLN
INTRAMUSCULAR | Status: DC | PRN
  Administered 2013-06-01: 120 mg via INTRAVENOUS

## 2013-06-01 MED ORDER — SUCCINYLCHOLINE CHLORIDE 20 MG/ML IJ SOLN
INTRAMUSCULAR | Status: AC
  Filled 2013-06-01: qty 1

## 2013-06-01 MED ORDER — LIDOCAINE HCL (PF) 1 % IJ SOLN
INTRAMUSCULAR | Status: AC
  Filled 2013-06-01: qty 5

## 2013-06-01 MED ORDER — ONDANSETRON HCL 4 MG/2ML IJ SOLN
4.0000 mg | Freq: Once | INTRAMUSCULAR | Status: AC
  Administered 2013-06-01: 4 mg via INTRAVENOUS

## 2013-06-01 MED ORDER — FENTANYL CITRATE 0.05 MG/ML IJ SOLN
25.0000 ug | INTRAMUSCULAR | Status: AC
  Administered 2013-06-01 (×2): 25 ug via INTRAVENOUS

## 2013-06-01 MED ORDER — FENTANYL CITRATE 0.05 MG/ML IJ SOLN
INTRAMUSCULAR | Status: DC | PRN
  Administered 2013-06-01: 100 ug via INTRAVENOUS
  Administered 2013-06-01: 50 ug via INTRAVENOUS

## 2013-06-01 MED ORDER — FENTANYL CITRATE 0.05 MG/ML IJ SOLN
INTRAMUSCULAR | Status: AC
  Filled 2013-06-01: qty 2

## 2013-06-01 MED ORDER — FENTANYL CITRATE 0.05 MG/ML IJ SOLN: 25.0000 ug | INTRAMUSCULAR | Status: DC | PRN

## 2013-06-01 MED ORDER — MIDAZOLAM HCL 2 MG/2ML IJ SOLN
1.0000 mg | INTRAMUSCULAR | Status: DC | PRN
  Administered 2013-06-01 (×2): 2 mg via INTRAVENOUS

## 2013-06-01 MED ORDER — STERILE WATER FOR IRRIGATION IR SOLN
Status: DC | PRN
  Administered 2013-06-01 (×2): 1000 mL

## 2013-06-01 MED ORDER — FENTANYL CITRATE 0.05 MG/ML IJ SOLN: 25.0000 ug | INTRAMUSCULAR | Status: DC

## 2013-06-01 MED ORDER — LIDOCAINE HCL 1 % IJ SOLN
INTRAMUSCULAR | Status: DC | PRN
  Administered 2013-06-01: 30 mg via INTRADERMAL

## 2013-06-01 MED ORDER — 0.9 % SODIUM CHLORIDE (POUR BTL) OPTIME
TOPICAL | Status: DC | PRN
  Administered 2013-06-01: 1000 mL

## 2013-06-01 MED ORDER — ROCURONIUM BROMIDE 100 MG/10ML IV SOLN
INTRAVENOUS | Status: DC | PRN
  Administered 2013-06-01: 5 mg via INTRAVENOUS

## 2013-06-01 MED ORDER — PROPOFOL 10 MG/ML IV BOLUS
INTRAVENOUS | Status: DC | PRN
  Administered 2013-06-01: 180 mg via INTRAVENOUS
  Administered 2013-06-01: 5 mg via INTRAVENOUS

## 2013-06-01 MED ORDER — ROCURONIUM BROMIDE 50 MG/5ML IV SOLN
INTRAVENOUS | Status: AC
  Filled 2013-06-01: qty 1

## 2013-06-01 MED ORDER — PROPOFOL 10 MG/ML IV EMUL
INTRAVENOUS | Status: AC
  Filled 2013-06-01: qty 20

## 2013-06-01 MED ORDER — ONDANSETRON HCL 4 MG/2ML IJ SOLN
INTRAMUSCULAR | Status: AC
  Filled 2013-06-01: qty 2

## 2013-06-01 MED ORDER — INSULIN ASPART 100 UNIT/ML ~~LOC~~ SOLN
18.0000 [IU] | Freq: Three times a day (TID) | SUBCUTANEOUS | Status: DC
  Administered 2013-06-01 – 2013-06-02 (×4): 18 [IU] via SUBCUTANEOUS

## 2013-06-01 MED ORDER — LACTATED RINGERS IV SOLN
INTRAVENOUS | Status: DC
  Administered 2013-06-01: 14:00:00 via INTRAVENOUS

## 2013-06-01 SURGICAL SUPPLY — 28 items
BAG HAMPER (MISCELLANEOUS) ×2 IMPLANT
BANDAGE CONFORM 2  STR LF (GAUZE/BANDAGES/DRESSINGS) ×2 IMPLANT
CLEANER TIP ELECTROSURG 2X2 (MISCELLANEOUS) ×2 IMPLANT
CLOTH BEACON ORANGE TIMEOUT ST (SAFETY) ×2 IMPLANT
COVER LIGHT HANDLE STERIS (MISCELLANEOUS) ×4 IMPLANT
ELECT REM PT RETURN 9FT ADLT (ELECTROSURGICAL) ×2
ELECTRODE REM PT RTRN 9FT ADLT (ELECTROSURGICAL) ×1 IMPLANT
GLOVE BIOGEL PI IND STRL 7.0 (GLOVE) ×1 IMPLANT
GLOVE BIOGEL PI INDICATOR 7.0 (GLOVE) ×1
GLOVE ECLIPSE 7.0 STRL STRAW (GLOVE) ×4 IMPLANT
GLOVE SKINSENSE NS SZ7.0 (GLOVE) ×1
GLOVE SKINSENSE STRL SZ7.0 (GLOVE) ×1 IMPLANT
GOWN STRL REIN XL XLG (GOWN DISPOSABLE) ×4 IMPLANT
KIT ROOM TURNOVER APOR (KITS) ×2 IMPLANT
MANIFOLD NEPTUNE II (INSTRUMENTS) ×2 IMPLANT
MARKER SKIN DUAL TIP RULER LAB (MISCELLANEOUS) ×2 IMPLANT
NS IRRIG 1000ML POUR BTL (IV SOLUTION) ×2 IMPLANT
PACK BASIC LIMB (CUSTOM PROCEDURE TRAY) IMPLANT
PACK MINOR (CUSTOM PROCEDURE TRAY) ×4 IMPLANT
PAD ABD 5X9 TENDERSORB (GAUZE/BANDAGES/DRESSINGS) ×2 IMPLANT
PAD ARMBOARD 7.5X6 YLW CONV (MISCELLANEOUS) ×2 IMPLANT
SET BASIN LINEN APH (SET/KITS/TRAYS/PACK) ×2 IMPLANT
SPONGE GAUZE 4X4 12PLY (GAUZE/BANDAGES/DRESSINGS) ×2 IMPLANT
SPONGE LAP 18X18 X RAY DECT (DISPOSABLE) ×2 IMPLANT
SWAB CULTURE LIQ STUART DBL (MISCELLANEOUS) IMPLANT
SYR BULB IRRIGATION 50ML (SYRINGE) ×4 IMPLANT
TAPE PAPER 3X10 WHT MICROPORE (GAUZE/BANDAGES/DRESSINGS) ×2 IMPLANT
WATER STERILE IRR 1000ML POUR (IV SOLUTION) ×4 IMPLANT

## 2013-06-01 NOTE — Progress Notes (Signed)
Surgery asked to see this morbidly obese insulin dependant diabetic for a right breast abscess which was incompletely drained in the ER 2 days ago.  Still has some residual fluctuance on exam as well as cellulitis.  Will plan for I&D and debridement in OR today.  Pt last  ate at 8:00 AM this was discussed with anesthesia.  Will need IV sedation and local.  Pt seen and examined and chart reviewed and procedure and risks explained and informed consent obtained.  Filed Vitals:   06/01/13 1250  BP: 137/84  Pulse: 74  Temp: 97.4 F (36.3 C)  Resp: 21    Blood sugar still in 300's.

## 2013-06-01 NOTE — Transfer of Care (Signed)
Immediate Anesthesia Transfer of Care Note  Patient: Martha [name]  Procedure(s) Performed: Procedure(s): INCISION AND DRAINAGE AND DEBRIDEMENT ABSCESS RIGHT BREAST (Right)  Patient Location: PACU  Anesthesia Type:General  Level of Consciousness: awake, alert  and oriented  Airway & Oxygen Therapy: Patient Spontanous Breathing and Patient connected to face mask oxygen  Post-op Assessment: Report given to PACU RN  Post vital signs: Reviewed and stable  Complications: No apparent anesthesia complications

## 2013-06-01 NOTE — Brief Op Note (Signed)
05/29/2013 - 06/01/2013  2:50 PM  PATIENT:  Martha [name]  47 y.o. female  PRE-OPERATIVE DIAGNOSIS:  Abscess of Right Breast  POST-OPERATIVE DIAGNOSIS:  Abscess of Right Breast (Mastitis)  PROCEDURE:  Procedure(s): INCISION AND DRAINAGE AND DEBRIDEMENT ABSCESS RIGHT BREAST (Right)  SURGEON:  Surgeon(s) and Role:    * [name], MD - Primary  PHYSICIAN ASSISTANT:   ASSISTANTS: none   ANESTHESIA:   general  EBL:  Total I/O In: 1000 [P.O.:700; I.V.:300] Out: 20 [Blood:20]  BLOOD ADMINISTERED:none  DRAINS: none   LOCAL MEDICATIONS USED:  NONE  SPECIMEN:  Source of Specimen:  right breast tissue.  DISPOSITION OF SPECIMEN:  PATHOLOGY  COUNTS:  YES  TOURNIQUET:  * No tourniquets in log *  DICTATION: .Other Dictation: Dictation Number OR dict.# [id number].  PLAN OF CARE: Admit to inpatient   PATIENT DISPOSITION:  PACU - hemodynamically stable.   Delay start of Pharmacological VTE agent (>24hrs) due to surgical blood loss or risk of bleeding: not applicable

## 2013-06-01 NOTE — Anesthesia Procedure Notes (Addendum)
Procedure Name: Intubation Date/Time: 06/01/2013 2:11 PM Performed by: Tressie Stalker E Pre-anesthesia Checklist: Patient identified, Patient being monitored, Timeout performed, Emergency Drugs available and Suction available Patient Re-evaluated:Patient Re-evaluated prior to inductionOxygen Delivery Method: Circle System Utilized Preoxygenation: Pre-oxygenation with 100% oxygen Intubation Type: IV induction, Rapid sequence and Cricoid Pressure applied Ventilation: Mask ventilation without difficulty Laryngoscope Size: Mac and 3 Grade View: Grade I Tube type: Oral Tube size: 7.0 mm Number of attempts: 1 Airway Equipment and Method: stylet Placement Confirmation: ETT inserted through vocal cords under direct vision,  positive ETCO2 and breath sounds checked- equal and bilateral Secured at: 22 cm Tube secured with: Tape Dental Injury: Teeth and Oropharynx as per pre-operative assessment

## 2013-06-01 NOTE — Anesthesia Postprocedure Evaluation (Signed)
  Anesthesia Post-op Note  Patient: Selinda Michaels  Procedure(s) Performed: Procedure(s): INCISION AND DRAINAGE AND DEBRIDEMENT ABSCESS RIGHT BREAST (Right)  Patient Location: PACU  Anesthesia Type:General  Level of Consciousness: awake, alert  and oriented  Airway and Oxygen Therapy: Patient Spontanous Breathing and Patient connected to face mask oxygen  Post-op Pain: none  Post-op Assessment: Post-op Vital signs reviewed, Patient's Cardiovascular Status Stable, Respiratory Function Stable, Patent Airway and No signs of Nausea or vomiting  Post-op Vital Signs: Reviewed and stable  Complications: No apparent anesthesia complications

## 2013-06-01 NOTE — Progress Notes (Signed)
Subjective: She continues to have trouble with her blood sugar has been is mostly in the 3 and 400s. She feels like she's got more problem with her breasts..  Objective: Vital signs in last 24 hours: Temp:  [97.5 F (36.4 C)-98.3 F (36.8 C)] 97.5 F (36.4 C) (11/05 0424) Pulse Rate:  [74-95] 74 (11/05 0424) Resp:  [20] 20 (11/05 0424) BP: (145-155)/(69-93) 155/88 mmHg (11/05 0424) SpO2:  [95 %-98 %] 97 % (11/05 0424) Weight:  [170.4 kg (375 lb 10.6 oz)] 170.4 kg (375 lb 10.6 oz) (11/05 0424) Weight change: 1.5 kg (3 lb 4.9 oz) Last BM Date: 05/30/13  Intake/Output from previous day: 11/04 0701 - 11/05 0700 In: 1830 [P.O.:1680; IV Piggyback:150] Out: 750 [Urine:750]  PHYSICAL EXAM General appearance: alert, cooperative, mild distress and morbidly obese Resp: clear to auscultation bilaterally Cardio: regular rate and rhythm, S1, S2 normal, no murmur, click, rub or gallop GI: soft, non-tender; bowel sounds normal; no masses,  no organomegaly Extremities: extremities normal, atraumatic, no cyanosis or edema  Lab Results:    Basic Metabolic Panel:  Recent Labs  05/29/13 2015 05/30/13 0455 05/30/13 2156  NA 129* 139  --   K 3.9 3.2*  --   CL 95* 105  --   CO2 24 26  --   GLUCOSE 547* 173* 445*  BUN 17 15  --   CREATININE 1.10 1.10  --   CALCIUM 9.0 8.5  --    Liver Function Tests: No results found for this basename: AST, ALT, ALKPHOS, BILITOT, PROT, ALBUMIN,  in the last 72 hours No results found for this basename: LIPASE, AMYLASE,  in the last 72 hours No results found for this basename: AMMONIA,  in the last 72 hours CBC:  Recent Labs  05/29/13 2015 05/30/13 0455  WBC 8.6 8.0  NEUTROABS 5.2  --   HGB 13.2 11.8*  HCT 38.3 34.3*  MCV 85.5 85.3  PLT 227 192   Cardiac Enzymes: No results found for this basename: CKTOTAL, CKMB, CKMBINDEX, TROPONINI,  in the last 72 hours BNP: No results found for this basename: PROBNP,  in the last 72 hours D-Dimer: No  results found for this basename: DDIMER,  in the last 72 hours CBG:  Recent Labs  05/30/13 1651 05/30/13 2108 05/31/13 0754 05/31/13 1124 05/31/13 1643 05/31/13 2145  GLUCAP 252* 435* 480* 312* 291* 417*   Hemoglobin A1C:  Recent Labs  05/30/13 0455  HGBA1C 11.7*   Fasting Lipid Panel: No results found for this basename: CHOL, HDL, LDLCALC, TRIG, CHOLHDL, LDLDIRECT,  in the last 72 hours Thyroid Function Tests: No results found for this basename: TSH, T4TOTAL, FREET4, T3FREE, THYROIDAB,  in the last 72 hours Anemia Panel: No results found for this basename: VITAMINB12, FOLATE, FERRITIN, TIBC, IRON, RETICCTPCT,  in the last 72 hours Coagulation: No results found for this basename: LABPROT, INR,  in the last 72 hours Urine Drug Screen: Drugs of Abuse  No results found for this basename: labopia, cocainscrnur, labbenz, amphetmu, thcu, labbarb    Alcohol Level: No results found for this basename: ETH,  in the last 72 hours Urinalysis:  Recent Labs  05/29/13 2017  COLORURINE YELLOW  LABSPEC 1.020  PHURINE 6.5  GLUCOSEU >1000*  HGBUR TRACE*  BILIRUBINUR NEGATIVE  KETONESUR NEGATIVE  PROTEINUR 100*  UROBILINOGEN 0.2  NITRITE NEGATIVE  LEUKOCYTESUR NEGATIVE   Misc. Labs:  ABGS  Recent Labs  05/29/13 2110  TCO2 22.0  HCO3 24.1*   CULTURES Recent Results (from  the past 240 hour(s))  CULTURE, ROUTINE-ABSCESS     Status: None   Collection Time    05/29/13  8:30 PM      Result Value Range Status   Specimen Description ABSCESS BREAST   Final   Special Requests NONE   Final   Gram Stain     Final   Value: ABUNDANT WBC PRESENT, PREDOMINANTLY PMN     NO SQUAMOUS EPITHELIAL CELLS SEEN     ABUNDANT GRAM POSITIVE COCCI     IN PAIRS FEW GRAM NEGATIVE RODS     Performed at [facility]   Culture     Final   Value: NO GROWTH 3 DAYS     Performed at [facility]   Report Status PENDING   Incomplete  MRSA PCR SCREENING     Status: None    Collection Time    05/29/13 11:12 PM      Result Value Range Status   MRSA by PCR NEGATIVE  NEGATIVE Final   Comment:            The GeneXpert MRSA Assay (FDA     approved for NASAL specimens     only), is one component of a     comprehensive MRSA colonization     surveillance program. It is not     intended to diagnose MRSA     infection nor to guide or     monitor treatment for     MRSA infections.   Studies/Results: No results found.  Medications:  Prior to Admission:  Prescriptions prior to admission  Medication Sig Dispense Refill  . albuterol (PROAIR HFA) 108 (90 BASE) MCG/ACT inhaler Inhale 2 puffs into the lungs every 6 (six) hours as needed for wheezing or shortness of breath.      Marland Kitchen albuterol (PROVENTIL) (2.5 MG/3ML) 0.083% nebulizer solution Take 2.5 mg by nebulization every 6 (six) hours as needed for wheezing or shortness of breath.      . ALPRAZolam (XANAX) 1 MG tablet Take 1 mg by mouth 4 (four) times daily.        Marland Kitchen amLODipine (NORVASC) 10 MG tablet Take 10 mg by mouth daily.      Marland Kitchen HYDROcodone-acetaminophen (NORCO) 10-325 MG per tablet Take 1 tablet by mouth every 6 (six) hours as needed for pain.      Marland Kitchen insulin aspart (NOVOLOG) 100 UNIT/ML injection Inject 10-20 Units into the skin 3 (three) times daily. Inject under skin 25 units before meals and hold if skips meal.  Do not give at bedtime  Correction AC (G-120) 2520      . insulin aspart (NOVOLOG) 100 UNIT/ML injection Inject under skin 25 units before meals and hold if skips meal.  Do not give at bedtimeCorrection AC (G-120) 2520      . Insulin Glargine (LANTUS SOLOSTAR) 100 UNIT/ML SOPN Inject 80 Units into the skin. Inject under skin 80 units at bedtime (divide into 2 equal injections)      . lisinopril-hydrochlorothiazide (PRINZIDE,ZESTORETIC) 20-12.5 MG per tablet Take 1 tablet by mouth 2 (two) times daily.       . metFORMIN (GLUCOPHAGE) 1000 MG tablet Take 1,000 mg by mouth 2 (two) times daily with a meal.       . omeprazole (PRILOSEC) 20 MG capsule Take 20 mg by mouth 2 (two) times daily.       . predniSONE (DELTASONE) 10 MG tablet 5,4,3,2,1 - take with food  15 tablet  0  .  rOPINIRole (REQUIP) 1 MG tablet Take 1 mg by mouth at bedtime.       . simvastatin (ZOCOR) 40 MG tablet Take 40 mg by mouth every evening.      Marland Kitchen UNKNOWN TO PATIENT Apply 1 application topically daily as needed (for irritation (vaginal area) COMPOUND MEDICATION: Ketoconazole mixed with another medication  (name is unknown)).       Scheduled: . acidophilus  2 capsule Oral Daily  . ALPRAZolam  1 mg Oral QID  . amLODipine  10 mg Oral Daily  . clindamycin (CLEOCIN) IV  600 mg Intravenous Q8H  . heparin  5,000 Units Subcutaneous Q8H  . lisinopril  40 mg Oral Daily   And  . hydrochlorothiazide  12.5 mg Oral Daily  . insulin aspart  0-20 Units Subcutaneous TID WC  . insulin aspart  0-5 Units Subcutaneous QHS  . insulin aspart  18 Units Subcutaneous TID WC  . insulin glargine  50 Units Subcutaneous BID  . pantoprazole  40 mg Oral BID AC  . rOPINIRole  1 mg Oral QHS  . simvastatin  40 mg Oral QPM  . sodium chloride  3 mL Intravenous Q12H   Continuous:  [id number], albuterol, alum & mag hydroxide-simeth, dextrose, HYDROcodone-acetaminophen, ondansetron (ZOFRAN) IV, sorbitol  Assesment: Her blood sugar is not controlled. I think this may be because of the breast abscess but she has not had good control of her blood sugar as an outpatient either. She does have a breast abscess and I think she's going to need surgical consultation to be sure that we don't need to drain this further. She has morbid obesity which I think is the basis of most of her problems. She has hypertension which is better controlled with changing her medications. She has some trouble with anxiety. Active Problems:   OBESITY   HYPERTENSION   Breast abscess   Diabetes mellitus, type 2   Hyperglycemia    Plan: She is already on resistant sliding scale.  I'm going to increase Lantus again today and increase the mealtime insulin. I will ask for surgical consultation.    LOS: 3 days   Alita Waldren L 06/01/2013, 8:35 AM

## 2013-06-01 NOTE — Anesthesia Preprocedure Evaluation (Signed)
Anesthesia Evaluation  Patient identified by MRN, date of birth, ID band Patient awake    Reviewed: Allergy & Precautions, H&P , NPO status , Patient's Chart, lab work & pertinent test results, reviewed documented beta blocker date and time   Airway Mallampati: II TM Distance: >3 FB Neck ROM: Full    Dental  (+) Teeth Intact, Poor Dentition and Chipped,    Pulmonary sleep apnea and Continuous Positive Airway Pressure Ventilation , former smoker,  breath sounds clear to auscultation        Cardiovascular hypertension, Pt. on medications Rhythm:Regular Rate:Normal     Neuro/Psych PSYCHIATRIC DISORDERS Depression Bipolar Disorder    GI/Hepatic GERD-  Medicated and Controlled,  Endo/Other  diabetes, Poorly Controlled, Type 2, Insulin DependentMorbid obesity  Renal/GU      Musculoskeletal   Abdominal   Peds  Hematology   Anesthesia Other Findings   Reproductive/Obstetrics                           Anesthesia Physical Anesthesia Plan  ASA: III  Anesthesia Plan: General   Post-op Pain Management:    Induction: Intravenous, Rapid sequence and Cricoid pressure planned  Airway Management Planned: Oral ETT  Additional Equipment:   Intra-op Plan:   Post-operative Plan: Extubation in OR  Informed Consent: I have reviewed the patients History and Physical, chart, labs and discussed the procedure including the risks, benefits and alternatives for the proposed anesthesia with the patient or authorized representative who has indicated his/her understanding and acceptance.     Plan Discussed with:   Anesthesia Plan Comments:         Anesthesia Quick Evaluation

## 2013-06-02 LAB — GLUCOSE, CAPILLARY
Glucose-Capillary: 117 mg/dL — ABNORMAL HIGH (ref 70–99)
Glucose-Capillary: 330 mg/dL — ABNORMAL HIGH (ref 70–99)
Glucose-Capillary: 313 mg/dL — ABNORMAL HIGH (ref 70–99)

## 2013-06-02 LAB — CULTURE, ROUTINE-ABSCESS

## 2013-06-02 MED ORDER — INSULIN ASPART 100 UNIT/ML ~~LOC~~ SOLN: 18.0000 [IU] | Freq: Three times a day (TID) | SUBCUTANEOUS | Status: DC

## 2013-06-02 NOTE — Progress Notes (Signed)
POD # 1  Wound clean resolving erythema and cellulitis. BID  dressing changes explained and pt instructed as to wound care.  I will follow perioperatively.  Extra strength tylenol OK for pain. D/C as per med service when diabetes under control.No further antbiotic therapy needed, wound care should be sufficient. Pt told to contact office for appointment.  Filed Vitals:   06/02/13 1020  BP: 131/78  Pulse: 79  Temp: 97.3 F (36.3 C)  Resp: 20

## 2013-06-02 NOTE — Progress Notes (Signed)
Patient received discharge instructions along with follow up appointments and prescriptions. Patient verbalized understanding of all instructions. Patient was escorted by staff via wheelchair to vehicle. Patient discharged to home in stable condition. 

## 2013-06-02 NOTE — Progress Notes (Signed)
Inpatient Diabetes Program Recommendations  AACE/ADA: New Consensus Statement on Inpatient Glycemic Control (2013)  Target Ranges:  Prepandial:   less than 140 mg/dL      Peak postprandial:   less than 180 mg/dL (1-2 hours)      Critically ill patients:  140 - 180 mg/dL   Results for CYNARA, AGUILLARD (MRN [id number]) as of 06/02/2013 09:05  Ref. Range 06/01/2013 07:35 06/01/2013 11:22 06/01/2013 13:27 06/01/2013 15:06 06/01/2013 16:16 06/01/2013 21:59 06/02/2013 07:49  Glucose-Capillary Latest Range: 70-99 mg/dL 388 (H) 362 (H) 176 (H) 117 (H) 157 (H) 330 (H) 253 (H)   Inpatient Diabetes Program Recommendations Insulin - Basal: Please consider increasing HS Lantus; recommend ordering Lantus 50 units QAM and Lantus 55 units QHS. Insulin - Meal Coverage: Please consider increasing meal coverage to Novolog 25 units TID with meals.  Note: Note Dr. Luan Pulling has been making daily adjustments to insulin regimen to improve inpatient glycemic control.  Yesterday patient ate breakfast and was NPO for lunch due to procedure yesterday afternoon.  Carb Modified diet was resumed after surgical procedure and patient ate supper.  Fasting blood sugar this morning was 253 mg/dl.  Please consider ordering Lantus 50 units QAM, Lantus 55 units QHS, and Novolog 25 units TID with meals for meal coverage.  Will continue to follow.  Thanks, Barnie Alderman, RN, MSN, CCRN Diabetes Coordinator Inpatient Diabetes Program [phone] (Team Pager) [phone] ([facility] office) [phone] [facility] office)

## 2013-06-02 NOTE — Progress Notes (Signed)
Subjective: She had surgery on her breast yesterday. She says she feels much better. Her blood sugar is better.  Objective: Vital signs in last 24 hours: Temp:  [97.3 F (36.3 C)-98.3 F (36.8 C)] 97.3 F (36.3 C) (11/06 0634) Pulse Rate:  [74-104] 77 (11/06 0634) Resp:  [15-31] 20 (11/06 0634) BP: (125-167)/(67-111) 140/95 mmHg (11/06 0634) SpO2:  [90 %-100 %] 96 % (11/06 0634) Weight:  [169.328 kg (373 lb 4.8 oz)] 169.328 kg (373 lb 4.8 oz) (11/06 0356) Weight change: -1.072 kg (-2 lb 5.8 oz) Last BM Date: 05/30/13  Intake/Output from previous day: 11/05 0701 - 11/06 0700 In: 1880 [P.O.:1180; I.V.:700] Out: 20 [Blood:20]  PHYSICAL EXAM General appearance: alert, cooperative, mild distress and morbidly obese Resp: clear to auscultation bilaterally Cardio: regular rate and rhythm, S1, S2 normal, no murmur, click, rub or gallop GI: soft, non-tender; bowel sounds normal; no masses,  no organomegaly Extremities: extremities normal, atraumatic, no cyanosis or edema  Lab Results:    Basic Metabolic Panel:  Recent Labs  05/30/13 2156  GLUCOSE 445*   Liver Function Tests: No results found for this basename: AST, ALT, ALKPHOS, BILITOT, PROT, ALBUMIN,  in the last 72 hours No results found for this basename: LIPASE, AMYLASE,  in the last 72 hours No results found for this basename: AMMONIA,  in the last 72 hours CBC: No results found for this basename: WBC, NEUTROABS, HGB, HCT, MCV, PLT,  in the last 72 hours Cardiac Enzymes: No results found for this basename: CKTOTAL, CKMB, CKMBINDEX, TROPONINI,  in the last 72 hours BNP: No results found for this basename: PROBNP,  in the last 72 hours D-Dimer: No results found for this basename: DDIMER,  in the last 72 hours CBG:  Recent Labs  06/01/13 1122 06/01/13 1327 06/01/13 1506 06/01/13 1616 06/01/13 2159 06/02/13 0749  GLUCAP 362* 176* 117* 157* 330* 253*   Hemoglobin A1C: No results found for this basename: HGBA1C,   in the last 72 hours Fasting Lipid Panel: No results found for this basename: CHOL, HDL, LDLCALC, TRIG, CHOLHDL, LDLDIRECT,  in the last 72 hours Thyroid Function Tests: No results found for this basename: TSH, T4TOTAL, FREET4, T3FREE, THYROIDAB,  in the last 72 hours Anemia Panel: No results found for this basename: VITAMINB12, FOLATE, FERRITIN, TIBC, IRON, RETICCTPCT,  in the last 72 hours Coagulation: No results found for this basename: LABPROT, INR,  in the last 72 hours Urine Drug Screen: Drugs of Abuse  No results found for this basename: labopia, cocainscrnur, labbenz, amphetmu, thcu, labbarb    Alcohol Level: No results found for this basename: ETH,  in the last 72 hours Urinalysis: No results found for this basename: COLORURINE, APPERANCEUR, LABSPEC, PHURINE, GLUCOSEU, HGBUR, BILIRUBINUR, KETONESUR, PROTEINUR, UROBILINOGEN, NITRITE, LEUKOCYTESUR,  in the last 72 hours Misc. Labs:  ABGS No results found for this basename: PHART, PCO2, PO2ART, TCO2, HCO3,  in the last 72 hours CULTURES Recent Results (from the past 240 hour(s))  CULTURE, ROUTINE-ABSCESS     Status: None   Collection Time    05/29/13  8:30 PM      Result Value Range Status   Specimen Description ABSCESS BREAST   Final   Special Requests NONE   Final   Gram Stain     Final   Value: ABUNDANT WBC PRESENT, PREDOMINANTLY PMN     NO SQUAMOUS EPITHELIAL CELLS SEEN     ABUNDANT GRAM POSITIVE COCCI     IN PAIRS FEW GRAM NEGATIVE RODS  Performed at [facility]     Final   Value: NO GROWTH 3 DAYS     Performed at [facility]   Report Status PENDING   Incomplete  MRSA PCR SCREENING     Status: None   Collection Time    05/29/13 11:12 PM      Result Value Range Status   MRSA by PCR NEGATIVE  NEGATIVE Final   Comment:            The GeneXpert MRSA Assay (FDA     approved for NASAL specimens     only), is one component of a     comprehensive MRSA colonization     surveillance  program. It is not     intended to diagnose MRSA     infection nor to guide or     monitor treatment for     MRSA infections.   Studies/Results: No results found.  Medications:  Prior to Admission:  Prescriptions prior to admission  Medication Sig Dispense Refill  . albuterol (PROAIR HFA) 108 (90 BASE) MCG/ACT inhaler Inhale 2 puffs into the lungs every 6 (six) hours as needed for wheezing or shortness of breath.      Marland Kitchen albuterol (PROVENTIL) (2.5 MG/3ML) 0.083% nebulizer solution Take 2.5 mg by nebulization every 6 (six) hours as needed for wheezing or shortness of breath.      . ALPRAZolam (XANAX) 1 MG tablet Take 1 mg by mouth 4 (four) times daily.        Marland Kitchen amLODipine (NORVASC) 10 MG tablet Take 10 mg by mouth daily.      Marland Kitchen HYDROcodone-acetaminophen (NORCO) 10-325 MG per tablet Take 1 tablet by mouth every 6 (six) hours as needed for pain.      Marland Kitchen insulin aspart (NOVOLOG) 100 UNIT/ML injection Inject 10-20 Units into the skin 3 (three) times daily. Inject under skin 25 units before meals and hold if skips meal.  Do not give at bedtime  Correction AC (G-120) 2520      . insulin aspart (NOVOLOG) 100 UNIT/ML injection Inject under skin 25 units before meals and hold if skips meal.  Do not give at bedtimeCorrection AC (G-120) 2520      . Insulin Glargine (LANTUS SOLOSTAR) 100 UNIT/ML SOPN Inject 80 Units into the skin. Inject under skin 80 units at bedtime (divide into 2 equal injections)      . lisinopril-hydrochlorothiazide (PRINZIDE,ZESTORETIC) 20-12.5 MG per tablet Take 1 tablet by mouth 2 (two) times daily.       . metFORMIN (GLUCOPHAGE) 1000 MG tablet Take 1,000 mg by mouth 2 (two) times daily with a meal.      . omeprazole (PRILOSEC) 20 MG capsule Take 20 mg by mouth 2 (two) times daily.       . predniSONE (DELTASONE) 10 MG tablet 5,4,3,2,1 - take with food  15 tablet  0  . rOPINIRole (REQUIP) 1 MG tablet Take 1 mg by mouth at bedtime.       . simvastatin (ZOCOR) 40 MG tablet Take 40 mg  by mouth every evening.      Marland Kitchen UNKNOWN TO PATIENT Apply 1 application topically daily as needed (for irritation (vaginal area) COMPOUND MEDICATION: Ketoconazole mixed with another medication  (name is unknown)).       Scheduled: . acidophilus  2 capsule Oral Daily  . ALPRAZolam  1 mg Oral QID  . amLODipine  10 mg Oral Daily  . clindamycin (CLEOCIN) IV  600 mg Intravenous Q8H  . lisinopril  40 mg Oral Daily   And  . hydrochlorothiazide  12.5 mg Oral Daily  . insulin aspart  0-20 Units Subcutaneous TID WC  . insulin aspart  0-5 Units Subcutaneous QHS  . insulin aspart  18 Units Subcutaneous TID WC  . insulin glargine  50 Units Subcutaneous BID  . pantoprazole  40 mg Oral BID AC  . rOPINIRole  1 mg Oral QHS  . simvastatin  40 mg Oral QPM  . sodium chloride  3 mL Intravenous Q12H   Continuous:  [id number], albuterol, alum & mag hydroxide-simeth, dextrose, HYDROcodone-acetaminophen, ondansetron (ZOFRAN) IV, sorbitol  Assesment: She was admitted with a breast abscess. She has uncontrolled diabetes but that is much better since she had the abscess drained again. She has morbid obesity and his planning on obesity surgery. Active Problems:   OBESITY   HYPERTENSION   Breast abscess   Diabetes mellitus, type 2   Hyperglycemia    Plan: Continue current treatments.    LOS: 4 days   Ludwig Tugwell L 06/02/2013, 9:04 AM

## 2013-06-02 NOTE — Anesthesia Postprocedure Evaluation (Signed)
  Anesthesia Post-op Note  Patient: Martha [name]  Procedure(s) Performed: Procedure(s): INCISION AND DRAINAGE AND DEBRIDEMENT ABSCESS RIGHT BREAST (Right)  Patient Location: Room 302  Anesthesia Type:General  Level of Consciousness: awake, alert , oriented and patient cooperative  Airway and Oxygen Therapy: Patient Spontanous Breathing  Post-op Pain: mild  Post-op Assessment: Post-op Vital signs reviewed, Patient's Cardiovascular Status Stable, Respiratory Function Stable, Patent Airway, No signs of Nausea or vomiting and Pain level controlled  Post-op Vital Signs: Reviewed and stable  Complications: No apparent anesthesia complications

## 2013-06-02 NOTE — Op Note (Signed)
NAMEMarland Kitchen  TRISTON, BLAKEY NO.:  [id number]  MEDICAL RECORD NO.:  [id number]  LOCATION:  A302                          FACILITY:  [facility]  PHYSICIAN:  Felicie Morn, M.D. DATE OF BIRTH:  [birth year]  DATE OF PROCEDURE:  06/01/2013 DATE OF DISCHARGE:                              OPERATIVE REPORT   SURGEON:  Felicie Morn, M.D.  PREOPERATIVE DIAGNOSIS:  Breast abscess mastitis.  POSTOPERATIVE DIAGNOSIS:  Breast abscess mastitis.  PROCEDURE:  I and D and debridement, right breast abscess.  Note this wound classification contaminated.  SPECIMEN:  Right breast tissue.  Note that this is a 47 year old insulin-dependent, morbidly obese, white female, who presented to the emergency room with a breast abscess.  This was partially drained.  She continued, however, to have signs of cellulitis and mastitis of her right breast with continued difficulty in controlling her blood sugars.  Surgery was consulted.  I felt that there was likely an incompletely drained abscess and we planned for surgery. We discussed complications not limited to, but including bleeding, infection, and the possibility that further surgery might be required and the need for wound packing and postoperative wound care.  Informed consent was obtained.  GROSS OPERATIVE FINDINGS:  Those consistent with right breast mastitis as mentioned above.  TECHNIQUE:  The patient was placed in supine position after adequate administration of general anesthesia via endotracheal intubation.  Her right breast was prepped with Betadine solution and draped in the usual manner.  An elliptical incision was carried out around the entire inflamed and abscess process in her right breast.  This was approximately 4 x 3 cm in diameter.  This was debrided down to normal breast tissue, irrigated with normal saline solution.  The bleeding was controlled with a cautery device and I then packed the wound with 2 inch saline  soaked roll gauze.  The wound was packed open and a sterile dressing was applied.  The patient will be returned to the floor and to Dr. Luan Pulling service.  We will also have followup wound care to see this patient and I will follow this patient as well perioperatively as an outpatient.  Intraoperatively there were no complications.  The patient had minimal blood loss less than 25 mL and received approximately 350 mL of crystalloids intraoperatively.  After closure as noted, all sponge, needle, and instrument counts were found to be correct.     Felicie Morn, M.D.     WB/MEDQ  D:  06/01/2013  T:  06/02/2013  Job:  [id number]  cc:   Percell Miller L. Luan Pulling, M.D. Fax: [phone]

## 2013-06-03 ENCOUNTER — Encounter (HOSPITAL_COMMUNITY): Payer: Self-pay | Admitting: General Surgery

## 2013-06-07 NOTE — ED Provider Notes (Signed)
Medical screening examination/treatment/procedure(s) were performed by non-physician practitioner and as supervising physician I was immediately available for consultation/collaboration.  EKG Interpretation   None        Nat Christen, MD 06/07/13 [phone]

## 2013-06-10 NOTE — Discharge Summary (Signed)
Physician Discharge Summary  Patient ID: Martha [name] MRN: [id number] DOB/AGE: Dec 04, 1965 47 y.o. Primary Care Physician:Arielys Wandersee L, MD Admit date: 05/29/2013 Discharge date: 06/10/2013    Discharge Diagnoses:   Active Problems:   OBESITY   HYPERTENSION   Breast abscess   Diabetes mellitus, type 2   Hyperglycemia     Medication List         ALPRAZolam 1 MG tablet  Commonly known as:  XANAX  Take 1 mg by mouth 4 (four) times daily.     amLODipine 10 MG tablet  Commonly known as:  NORVASC  Take 10 mg by mouth daily.     HYDROcodone-acetaminophen 10-325 MG per tablet  Commonly known as:  NORCO  Take 1 tablet by mouth every 6 (six) hours as needed for pain.     insulin aspart 100 UNIT/ML injection  Commonly known as:  novoLOG  Inject 10-20 Units into the skin 3 (three) times daily. Inject under skin 25 units before meals and hold if skips meal.  Do not give at bedtime  Correction AC (G-120) 2520     insulin aspart 100 UNIT/ML injection  Commonly known as:  novoLOG  Inject under skin 25 units before meals and hold if skips meal.  Do not give at bedtimeCorrection AC (G-120) 2520     LANTUS SOLOSTAR 100 UNIT/ML Sopn  Generic drug:  Insulin Glargine  Inject 80 Units into the skin. Inject under skin 80 units at bedtime (divide into 2 equal injections)     lisinopril-hydrochlorothiazide 20-12.5 MG per tablet  Commonly known as:  PRINZIDE,ZESTORETIC  Take 1 tablet by mouth 2 (two) times daily.     metFORMIN 1000 MG tablet  Commonly known as:  GLUCOPHAGE  Take 1,000 mg by mouth 2 (two) times daily with a meal.     omeprazole 20 MG capsule  Commonly known as:  PRILOSEC  Take 20 mg by mouth 2 (two) times daily.     predniSONE 10 MG tablet  Commonly known as:  DELTASONE  5,4,3,2,1 - take with food     PROAIR HFA 108 (90 BASE) MCG/ACT inhaler  Generic drug:  albuterol  Inhale 2 puffs into the lungs every 6 (six) hours as needed for wheezing or shortness of  breath.     albuterol (2.5 MG/3ML) 0.083% nebulizer solution  Commonly known as:  PROVENTIL  Take 2.5 mg by nebulization every 6 (six) hours as needed for wheezing or shortness of breath.     rOPINIRole 1 MG tablet  Commonly known as:  REQUIP  Take 1 mg by mouth at bedtime.     simvastatin 40 MG tablet  Commonly known as:  ZOCOR  Take 40 mg by mouth every evening.     UNKNOWN TO PATIENT  Apply 1 application topically daily as needed (for irritation (vaginal area) COMPOUND MEDICATION: Ketoconazole mixed with another medication  (name is unknown)).        Discharged Condition: Improved    Consults: Gen. surgery, Dr. Romona Curls  Significant Diagnostic Studies: Dg Chest 2 View  05/28/2013   CLINICAL DATA:  Dyspnea for 2 weeks, history bronchitis  EXAM: CHEST  2 VIEW  COMPARISON:  08/30/2012  FINDINGS: Normal heart size, mediastinal contours, and pulmonary vascularity.  Lungs clear.  No pleural effusion or pneumothorax.  Bones unremarkable.  IMPRESSION: No acute abnormalities.   Electronically Signed   By: Lavonia Dana M.D.   On: 05/28/2013 00:46    Lab Results: Basic Metabolic Panel: No results  found for this basename: NA, K, CL, CO2, GLUCOSE, BUN, CREATININE, CALCIUM, MG, PHOS,  in the last 72 hours Liver Function Tests: No results found for this basename: AST, ALT, ALKPHOS, BILITOT, PROT, ALBUMIN,  in the last 72 hours   CBC: No results found for this basename: WBC, NEUTROABS, HGB, HCT, MCV, PLT,  in the last 72 hours  No results found for this or any previous visit (from the past 240 hour(s)).   Hospital Course: This is a 47 year old who came to the emergency department because of an abscess in her breast. She has had multiple similar episodes. She was found to have markedly elevated blood sugar. Her abscess was drained in the emergency department but she continued to have high blood sugar and pain in the breast area. She was treated with antibiotics and had consultation with  general surgery and had debridement of the abscess. Her blood sugar was treated and improved. She had some trouble with her blood pressure and her medications were altered.  Discharge Exam: Blood pressure 131/78, pulse 79, temperature 97.3 F (36.3 C), temperature source Axillary, resp. rate 20, height 5\' 7"  (1.702 m), weight 169.328 kg (373 lb 4.8 oz), SpO2 93.00%. She is morbidly obese. Her breasts abscess is much improved and is clean and dry now. Her chest is clear. Her heart is regular.  Disposition: Home. She was given instructions on how to dress the abscess. Her medications for diabetes were altered      Discharge Orders   Future Orders Complete By Expires   Discharge patient  As directed         Signed: Elric Tirado L Pager [phone]  06/10/2013, 7:51 AM

## 2013-10-09 ENCOUNTER — Inpatient Hospital Stay (HOSPITAL_COMMUNITY)
Admission: EM | Admit: 2013-10-09 | Discharge: 2013-10-14 | DRG: 600 | Disposition: A | Discharge status: Home / Self Care | Payer: Medicaid Other | Attending: Pulmonary Disease | Admitting: Pulmonary Disease

## 2013-10-09 ENCOUNTER — Encounter (HOSPITAL_COMMUNITY): Payer: Self-pay | Admitting: Emergency Medicine

## 2013-10-09 DIAGNOSIS — IMO0002 Reserved for concepts with insufficient information to code with codable children: Secondary | ICD-10-CM

## 2013-10-09 DIAGNOSIS — F319 Bipolar disorder, unspecified: Secondary | ICD-10-CM | POA: Diagnosis present

## 2013-10-09 DIAGNOSIS — R739 Hyperglycemia, unspecified: Secondary | ICD-10-CM

## 2013-10-09 DIAGNOSIS — I10 Essential (primary) hypertension: Secondary | ICD-10-CM | POA: Diagnosis present

## 2013-10-09 DIAGNOSIS — J45909 Unspecified asthma, uncomplicated: Secondary | ICD-10-CM | POA: Diagnosis present

## 2013-10-09 DIAGNOSIS — E669 Obesity, unspecified: Secondary | ICD-10-CM

## 2013-10-09 DIAGNOSIS — E1165 Type 2 diabetes mellitus with hyperglycemia: Secondary | ICD-10-CM | POA: Diagnosis present

## 2013-10-09 DIAGNOSIS — E11621 Type 2 diabetes mellitus with foot ulcer: Secondary | ICD-10-CM | POA: Diagnosis present

## 2013-10-09 DIAGNOSIS — G2581 Restless legs syndrome: Secondary | ICD-10-CM | POA: Diagnosis present

## 2013-10-09 DIAGNOSIS — L97529 Non-pressure chronic ulcer of other part of left foot with unspecified severity: Secondary | ICD-10-CM

## 2013-10-09 DIAGNOSIS — I5033 Acute on chronic diastolic (congestive) heart failure: Secondary | ICD-10-CM | POA: Diagnosis present

## 2013-10-09 DIAGNOSIS — Z87891 Personal history of nicotine dependence: Secondary | ICD-10-CM

## 2013-10-09 DIAGNOSIS — K219 Gastro-esophageal reflux disease without esophagitis: Secondary | ICD-10-CM | POA: Diagnosis present

## 2013-10-09 DIAGNOSIS — IMO0001 Reserved for inherently not codable concepts without codable children: Secondary | ICD-10-CM | POA: Diagnosis present

## 2013-10-09 DIAGNOSIS — N61 Mastitis without abscess: Principal | ICD-10-CM | POA: Diagnosis present

## 2013-10-09 DIAGNOSIS — G4733 Obstructive sleep apnea (adult) (pediatric): Secondary | ICD-10-CM | POA: Diagnosis present

## 2013-10-09 DIAGNOSIS — Z6841 Body Mass Index (BMI) 40.0 and over, adult: Secondary | ICD-10-CM

## 2013-10-09 DIAGNOSIS — F411 Generalized anxiety disorder: Secondary | ICD-10-CM | POA: Diagnosis present

## 2013-10-09 DIAGNOSIS — N611 Abscess of the breast and nipple: Secondary | ICD-10-CM

## 2013-10-09 DIAGNOSIS — I509 Heart failure, unspecified: Secondary | ICD-10-CM | POA: Diagnosis present

## 2013-10-09 LAB — COMPREHENSIVE METABOLIC PANEL
ALT: <5 U/L (ref 0–35)
AST: <5 U/L (ref 0–37)
Albumin: 2.2 g/dL — ABNORMAL LOW (ref 3.5–5.2)
Alkaline Phosphatase: 129 U/L — ABNORMAL HIGH (ref 39–117)
BUN: 15 mg/dL (ref 6–23)
CALCIUM: 8.8 mg/dL (ref 8.4–10.5)
CO2: 23 mEq/L (ref 19–32)
Chloride: 89 mEq/L — ABNORMAL LOW (ref 96–112)
Creatinine, Ser: 0.95 mg/dL (ref 0.50–1.10)
GFR calc non Af Amer: 71 mL/min — ABNORMAL LOW (ref >90)
GFR, EST AFRICAN AMERICAN: 83 mL/min — AB (ref >90)
GLUCOSE: 690 mg/dL — AB (ref 70–99)
Potassium: 3.7 mEq/L (ref 3.7–5.3)
SODIUM: 126 meq/L — AB (ref 137–147)
Total Bilirubin: <0.2 mg/dL — ABNORMAL LOW (ref 0.3–1.2)
Total Protein: 5.8 g/dL — ABNORMAL LOW (ref 6.0–8.3)

## 2013-10-09 LAB — GLUCOSE, CAPILLARY
GLUCOSE-CAPILLARY: 205 mg/dL — AB (ref 70–99)
GLUCOSE-CAPILLARY: 215 mg/dL — AB (ref 70–99)
GLUCOSE-CAPILLARY: 246 mg/dL — AB (ref 70–99)
GLUCOSE-CAPILLARY: 299 mg/dL — AB (ref 70–99)
GLUCOSE-CAPILLARY: 326 mg/dL — AB (ref 70–99)
Glucose-Capillary: 423 mg/dL — ABNORMAL HIGH (ref 70–99)
Glucose-Capillary: 433 mg/dL — ABNORMAL HIGH (ref 70–99)
Glucose-Capillary: 454 mg/dL — ABNORMAL HIGH (ref 70–99)
Glucose-Capillary: 281 mg/dL — ABNORMAL HIGH (ref 70–99)
Glucose-Capillary: 334 mg/dL — ABNORMAL HIGH (ref 70–99)
Glucose-Capillary: 313 mg/dL — ABNORMAL HIGH (ref 70–99)
Glucose-Capillary: 304 mg/dL — ABNORMAL HIGH (ref 70–99)
Glucose-Capillary: 249 mg/dL — ABNORMAL HIGH (ref 70–99)
Glucose-Capillary: 245 mg/dL — ABNORMAL HIGH (ref 70–99)
Glucose-Capillary: 251 mg/dL — ABNORMAL HIGH (ref 70–99)
Glucose-Capillary: 306 mg/dL — ABNORMAL HIGH (ref 70–99)
Glucose-Capillary: 224 mg/dL — ABNORMAL HIGH (ref 70–99)
Glucose-Capillary: 232 mg/dL — ABNORMAL HIGH (ref 70–99)

## 2013-10-09 LAB — URINALYSIS, ROUTINE W REFLEX MICROSCOPIC
Bilirubin Urine: NEGATIVE
Glucose, UA: >1000 mg/dL — AB
Ketones, ur: NEGATIVE mg/dL
LEUKOCYTES UA: NEGATIVE
NITRITE: NEGATIVE
Protein, ur: >300 mg/dL — AB
SPECIFIC GRAVITY, URINE: 1.015 (ref 1.005–1.030)
UROBILINOGEN UA: 0.2 mg/dL (ref 0.0–1.0)
pH: 6 (ref 5.0–8.0)

## 2013-10-09 LAB — CBC WITH DIFFERENTIAL/PLATELET
Basophils Absolute: 0 10*3/uL (ref 0.0–0.1)
Basophils Relative: 1 % (ref 0–1)
EOS ABS: 0.2 10*3/uL (ref 0.0–0.7)
EOS PCT: 2 % (ref 0–5)
HCT: 36.5 % (ref 36.0–46.0)
Hemoglobin: 13 g/dL (ref 12.0–15.0)
LYMPHS ABS: 3.2 10*3/uL (ref 0.7–4.0)
Lymphocytes Relative: 49 % — ABNORMAL HIGH (ref 12–46)
MCH: 29.7 pg (ref 26.0–34.0)
MCHC: 35.6 g/dL (ref 30.0–36.0)
MCV: 83.5 fL (ref 78.0–100.0)
MONO ABS: 0.4 10*3/uL (ref 0.1–1.0)
MONOS PCT: 6 % (ref 3–12)
Neutro Abs: 2.7 10*3/uL (ref 1.7–7.7)
Neutrophils Relative %: 42 % — ABNORMAL LOW (ref 43–77)
PLATELETS: 215 10*3/uL (ref 150–400)
RBC: 4.37 MIL/uL (ref 3.87–5.11)
RDW: 13.7 % (ref 11.5–15.5)
WBC: 6.6 10*3/uL (ref 4.0–10.5)

## 2013-10-09 LAB — CBG MONITORING, ED
Glucose-Capillary: >600 mg/dL (ref 70–99)
Glucose-Capillary: 415 mg/dL — ABNORMAL HIGH (ref 70–99)

## 2013-10-09 LAB — URINE MICROSCOPIC-ADD ON

## 2013-10-09 LAB — MRSA PCR SCREENING: MRSA by PCR: NEGATIVE

## 2013-10-09 MED ORDER — DEXTROSE-NACL 5-0.45 % IV SOLN
INTRAVENOUS | Status: DC
  Administered 2013-10-09: 07:00:00 via INTRAVENOUS

## 2013-10-09 MED ORDER — ALBUTEROL SULFATE (2.5 MG/3ML) 0.083% IN NEBU
2.5000 mg | INHALATION_SOLUTION | Freq: Four times a day (QID) | RESPIRATORY_TRACT | Status: DC | PRN
  Administered 2013-10-10 – 2013-10-12 (×3): 2.5 mg via RESPIRATORY_TRACT
  Filled 2013-10-09 (×3): qty 3

## 2013-10-09 MED ORDER — IPRATROPIUM-ALBUTEROL 0.5-2.5 (3) MG/3ML IN SOLN
3.0000 mL | Freq: Three times a day (TID) | RESPIRATORY_TRACT | Status: DC
  Administered 2013-10-10 – 2013-10-14 (×10): 3 mL via RESPIRATORY_TRACT
  Filled 2013-10-09 (×13): qty 3

## 2013-10-09 MED ORDER — INSULIN ASPART 100 UNIT/ML IV SOLN
10.0000 [IU] | Freq: Once | INTRAVENOUS | Status: AC
  Administered 2013-10-09: 10 [IU] via INTRAVENOUS

## 2013-10-09 MED ORDER — SODIUM CHLORIDE 0.9 % IJ SOLN
3.0000 mL | Freq: Two times a day (BID) | INTRAMUSCULAR | Status: DC
  Administered 2013-10-10 – 2013-10-14 (×2): 3 mL via INTRAVENOUS

## 2013-10-09 MED ORDER — PANTOPRAZOLE SODIUM 40 MG PO TBEC
40.0000 mg | DELAYED_RELEASE_TABLET | Freq: Every day | ORAL | Status: DC
  Administered 2013-10-09 – 2013-10-14 (×6): 40 mg via ORAL
  Filled 2013-10-09 (×6): qty 1

## 2013-10-09 MED ORDER — PREDNISONE 10 MG PO TABS
5.0000 mg | ORAL_TABLET | Freq: Two times a day (BID) | ORAL | Status: DC
  Administered 2013-10-09 – 2013-10-14 (×12): 5 mg via ORAL
  Filled 2013-10-09 (×11): qty 1

## 2013-10-09 MED ORDER — IPRATROPIUM-ALBUTEROL 0.5-2.5 (3) MG/3ML IN SOLN
3.0000 mL | RESPIRATORY_TRACT | Status: DC
  Administered 2013-10-09 (×3): 3 mL via RESPIRATORY_TRACT
  Filled 2013-10-09 (×2): qty 3

## 2013-10-09 MED ORDER — ALBUTEROL SULFATE HFA 108 (90 BASE) MCG/ACT IN AERS: 2.0000 | INHALATION_SPRAY | Freq: Four times a day (QID) | RESPIRATORY_TRACT | Status: DC | PRN

## 2013-10-09 MED ORDER — SODIUM CHLORIDE 0.9 % IV SOLN
INTRAVENOUS | Status: DC
  Administered 2013-10-09: 17.7 [IU]/h via INTRAVENOUS
  Administered 2013-10-09: 23:00:00 via INTRAVENOUS
  Administered 2013-10-09: 4.4 [IU]/h via INTRAVENOUS
  Administered 2013-10-09: 3.6 [IU]/h via INTRAVENOUS
  Filled 2013-10-09 (×2): qty 1

## 2013-10-09 MED ORDER — ONDANSETRON HCL 4 MG/2ML IJ SOLN: 4.0000 mg | Freq: Four times a day (QID) | INTRAMUSCULAR | Status: DC | PRN

## 2013-10-09 MED ORDER — SODIUM CHLORIDE 0.9 % IV BOLUS (SEPSIS)
1000.0000 mL | Freq: Once | INTRAVENOUS | Status: AC
  Administered 2013-10-09: 1000 mL via INTRAVENOUS

## 2013-10-09 MED ORDER — ACETAMINOPHEN 650 MG RE SUPP: 650.0000 mg | Freq: Four times a day (QID) | RECTAL | Status: DC | PRN

## 2013-10-09 MED ORDER — AMLODIPINE BESYLATE 5 MG PO TABS
10.0000 mg | ORAL_TABLET | Freq: Every day | ORAL | Status: DC
  Administered 2013-10-09 – 2013-10-14 (×6): 10 mg via ORAL
  Filled 2013-10-09 (×6): qty 2

## 2013-10-09 MED ORDER — HYDROCODONE-ACETAMINOPHEN 5-325 MG PO TABS
1.0000 | ORAL_TABLET | ORAL | Status: DC | PRN
  Administered 2013-10-09 – 2013-10-13 (×2): 1 via ORAL
  Filled 2013-10-09 (×2): qty 1

## 2013-10-09 MED ORDER — SODIUM CHLORIDE 0.9 % IV SOLN
INTRAVENOUS | Status: AC
  Administered 2013-10-09: 01:00:00 via INTRAVENOUS

## 2013-10-09 MED ORDER — ENOXAPARIN SODIUM 80 MG/0.8ML ~~LOC~~ SOLN
80.0000 mg | SUBCUTANEOUS | Status: DC
  Administered 2013-10-09: 80 mg via SUBCUTANEOUS
  Filled 2013-10-09: qty 0.8

## 2013-10-09 MED ORDER — ROPINIROLE HCL 1 MG PO TABS
1.0000 mg | ORAL_TABLET | Freq: Every day | ORAL | Status: DC
  Administered 2013-10-09 – 2013-10-13 (×5): 1 mg via ORAL
  Filled 2013-10-09 (×8): qty 1

## 2013-10-09 MED ORDER — ACETAMINOPHEN 325 MG PO TABS: 650.0000 mg | ORAL_TABLET | Freq: Four times a day (QID) | ORAL | Status: DC | PRN

## 2013-10-09 MED ORDER — SODIUM CHLORIDE 0.9 % IV SOLN
INTRAVENOUS | Status: DC
  Administered 2013-10-09 (×3): via INTRAVENOUS
  Administered 2013-10-10: 125 mL via INTRAVENOUS
  Administered 2013-10-11 – 2013-10-12 (×4): via INTRAVENOUS

## 2013-10-09 MED ORDER — SIMVASTATIN 20 MG PO TABS: 40.0000 mg | ORAL_TABLET | Freq: Every evening | ORAL | Status: DC

## 2013-10-09 MED ORDER — ONDANSETRON HCL 4 MG PO TABS: 4.0000 mg | ORAL_TABLET | Freq: Four times a day (QID) | ORAL | Status: DC | PRN

## 2013-10-09 MED ORDER — HYDROMORPHONE HCL PF 1 MG/ML IJ SOLN
1.0000 mg | INTRAMUSCULAR | Status: DC | PRN
  Administered 2013-10-12 – 2013-10-13 (×3): 1 mg via INTRAVENOUS
  Filled 2013-10-09 (×3): qty 1

## 2013-10-09 MED ORDER — ATORVASTATIN CALCIUM 20 MG PO TABS
20.0000 mg | ORAL_TABLET | Freq: Every day | ORAL | Status: DC
  Administered 2013-10-09 – 2013-10-13 (×4): 20 mg via ORAL
  Filled 2013-10-09 (×5): qty 1

## 2013-10-09 MED ORDER — ALPRAZOLAM 1 MG PO TABS
1.0000 mg | ORAL_TABLET | Freq: Four times a day (QID) | ORAL | Status: DC
  Administered 2013-10-09 – 2013-10-14 (×18): 1 mg via ORAL
  Filled 2013-10-09 (×3): qty 1
  Filled 2013-10-09: qty 2
  Filled 2013-10-09: qty 1
  Filled 2013-10-09 (×2): qty 2
  Filled 2013-10-09: qty 1
  Filled 2013-10-09: qty 2
  Filled 2013-10-09 (×3): qty 1
  Filled 2013-10-09 (×2): qty 2
  Filled 2013-10-09: qty 1
  Filled 2013-10-09 (×4): qty 2
  Filled 2013-10-09: qty 1

## 2013-10-09 MED ORDER — CLINDAMYCIN PHOSPHATE 600 MG/50ML IV SOLN
600.0000 mg | Freq: Once | INTRAVENOUS | Status: AC
  Administered 2013-10-09: 600 mg via INTRAVENOUS
  Filled 2013-10-09: qty 50

## 2013-10-09 NOTE — ED Provider Notes (Signed)
CSN: [id number]     Arrival date & time 10/09/13  0204 History   First MD Initiated Contact with Patient 10/09/13 0215     Chief Complaint  Patient presents with  . Hyperglycemia     (Consider location/radiation/quality/duration/timing/severity/associated sxs/prior Treatment) HPI Patient presents with several days of uncontrolled glucose. She denies any nausea, vomiting, abdominal pain, dysuria or constitutional symptoms. She's had no recent change to her insulin dosing and states she has been compliant. She has had an abscess under her left breast that his grown in size and become inflamed or the past week. She thinks this may be causing her blood sugar instability. The abscess is spontaneously draining purulent fluid. She's had no fevers or chills. Past Medical History  Diagnosis Date  . Diabetes mellitus   . Hypertension   . RLS (restless legs syndrome)   . Acid reflux   . Bipolar 1 disorder   . Depression   . OSA (obstructive sleep apnea)     cpap  . Anemia   . COPD (chronic obstructive pulmonary disease)     bronchitis  . Arthritis    Past Surgical History  Procedure Laterality Date  . Tubal ligation    . Endometrial ablation    . Colonoscopy  06/06/2010    [id number] bleeding secondary to internal hemorrhoids but incomplete evaluation secondary to poor right colon prep/small rectal and sigmoid colon polyps (hyperplastic). PROPOFOL  . Esophagogastroduodenoscopy  06/06/2010    [id number] erythema and edema of body of stomach, with sessile polypoid lesions. bx benign. no h.pylori  . Colonoscopy  May 2013    Dr. Gilliam/[facility]: 5 mm a descending colon polyp, hyperplastic. Adequate bowel prep.  . Flexible sigmoidoscopy N/A 05/17/2013    Procedure: FLEXIBLE SIGMOIDOSCOPY WITH PROPOFOL;  Surgeon: [name], MD;  Location: [facility];  Service: Endoscopy;  Laterality: N/A;  . Esophagogastroduodenoscopy (egd) with propofol N/A 05/17/2013    Procedure: ESOPHAGOGASTRODUODENOSCOPY  (EGD) WITH PROPOFOL;  Surgeon: [name], MD;  Location: [facility];  Service: Endoscopy;  Laterality: N/A;  g junction is 42 cm  . Savory dilation N/A 05/17/2013    Procedure: SAVORY DILATION;  Surgeon: [name], MD;  Location: [facility];  Service: Endoscopy;  Laterality: N/A;  14/15/16  . Hemorrhoid banding N/A 05/17/2013    Procedure: HEMORRHOID BANDING;  Surgeon: [name], MD;  Location: [facility];  Service: Endoscopy;  Laterality: N/A;  2 bands placed  . Esophageal biopsy N/A 05/17/2013    Procedure: BIOPSY;  Surgeon: [name], MD;  Location: [facility];  Service: Endoscopy;  Laterality: N/A;  . Irrigation and debridement abscess Right 06/01/2013    Procedure: INCISION AND DRAINAGE AND DEBRIDEMENT ABSCESS RIGHT BREAST;  Surgeon: [name], MD;  Location: [facility];  Service: General;  Laterality: Right;   Family History  Problem Relation Age of Onset  . Adopted: Yes   History  Substance Use Topics  . Smoking status: Former Smoker -- 1.50 packs/day for 25 years    Types: Cigarettes    Quit date: 10/23/2007  . Smokeless tobacco: Never Used  . Alcohol Use: No   OB History   Grav Para Term Preterm Abortions TAB SAB Ect Mult Living   2 2 1 1      2      Review of Systems  Unable to perform ROS Constitutional: Negative for fever, chills and fatigue.  Respiratory: Negative for cough and shortness of breath.   Cardiovascular: Negative for chest pain.  Gastrointestinal: Negative for nausea, vomiting, abdominal pain, diarrhea and constipation.  Endocrine: Positive for polydipsia and polyuria.  Genitourinary: Negative for dysuria, frequency and flank pain.  Musculoskeletal: Negative for back pain.  Skin: Negative for wound.  Neurological: Negative for dizziness, weakness, light-headedness, numbness and headaches.  All other systems reviewed and are negative.      Allergies  Codeine  Home Medications   Current Outpatient Rx  Name  Route  Sig  Dispense  Refill   . albuterol (PROAIR HFA) 108 (90 BASE) MCG/ACT inhaler   Inhalation   Inhale 2 puffs into the lungs every 6 (six) hours as needed for wheezing or shortness of breath.         Marland Kitchen albuterol (PROVENTIL) (2.5 MG/3ML) 0.083% nebulizer solution   Nebulization   Take 2.5 mg by nebulization every 6 (six) hours as needed for wheezing or shortness of breath.         . ALPRAZolam (XANAX) 1 MG tablet   Oral   Take 1 mg by mouth 4 (four) times daily.           Marland Kitchen amLODipine (NORVASC) 10 MG tablet   Oral   Take 10 mg by mouth daily.         Marland Kitchen HYDROcodone-acetaminophen (NORCO) 10-325 MG per tablet   Oral   Take 1 tablet by mouth every 6 (six) hours as needed for pain.         Marland Kitchen insulin aspart (NOVOLOG) 100 UNIT/ML injection   Subcutaneous   Inject 10-20 Units into the skin 3 (three) times daily. Inject under skin 25 units before meals and hold if skips meal.  Do not give at bedtime  Correction AC (G-120) 2520         . insulin aspart (NOVOLOG) 100 UNIT/ML injection      Inject under skin 25 units before meals and hold if skips meal.  Do not give at bedtimeCorrection AC (G-120) 2520         . Insulin Glargine (LANTUS SOLOSTAR) 100 UNIT/ML SOPN   Subcutaneous   Inject 80 Units into the skin. Inject under skin 80 units at bedtime (divide into 2 equal injections)         . lisinopril-hydrochlorothiazide (PRINZIDE,ZESTORETIC) 20-12.5 MG per tablet   Oral   Take 1 tablet by mouth 2 (two) times daily.          . metFORMIN (GLUCOPHAGE) 1000 MG tablet   Oral   Take 1,000 mg by mouth 2 (two) times daily with a meal.         . omeprazole (PRILOSEC) 20 MG capsule   Oral   Take 20 mg by mouth 2 (two) times daily.          . predniSONE (DELTASONE) 10 MG tablet      5,4,3,2,1 - take with food   15 tablet   0   . rOPINIRole (REQUIP) 1 MG tablet   Oral   Take 1 mg by mouth at bedtime.          . simvastatin (ZOCOR) 40 MG tablet   Oral   Take 40 mg by mouth every  evening.         Marland Kitchen UNKNOWN TO PATIENT   Topical   Apply 1 application topically daily as needed (for irritation (vaginal area) COMPOUND MEDICATION: Ketoconazole mixed with another medication  (name is unknown)).          There were no vitals taken for this visit. Physical  Exam  Nursing note and vitals reviewed. Constitutional: She is oriented to person, place, and time. She appears well-developed and well-nourished. No distress.  HENT:  Head: Normocephalic and atraumatic.  Mouth/Throat: Oropharynx is clear and moist.  Eyes: EOM are normal. Pupils are equal, round, and reactive to light.  Neck: Normal range of motion. Neck supple.  Cardiovascular: Normal rate and regular rhythm.   Pulmonary/Chest: Effort normal and breath sounds normal. No respiratory distress. She has no wheezes. She has no rales.  Abdominal: Soft. Bowel sounds are normal. She exhibits no distension. There is no tenderness. There is no rebound and no guarding.  Musculoskeletal: Normal range of motion. She exhibits no edema and no tenderness.  Neurological: She is alert and oriented to person, place, and time.  Moves all extremities without deficit. Sensation grossly intact.  Skin: Skin is warm and dry. No rash noted. No erythema.  Fluctuant mass with roughly 1-2 cm diameter under the left breast. This is spontaneously draining purulent fluid. I don't see any evidence of cellulitis no the tissue over the mass is hyperpigmented.  Psychiatric: She has a normal mood and affect. Her behavior is normal.    ED Course  Procedures (including critical care time) Labs Review Labs Reviewed  CBG MONITORING, ED - Abnormal; Notable for the following:    Glucose-Capillary >600 (*)    All other components within normal limits  CBC WITH DIFFERENTIAL  COMPREHENSIVE METABOLIC PANEL  URINALYSIS, ROUTINE W REFLEX MICROSCOPIC   Imaging Review No results found.   EKG Interpretation None      MDM   Final diagnoses:  None       Discussed with Dr. Ardath Sax. Will admit to step down bed for insulin drip.   The patient's breast abscess is draining and I do not believe additional surgical procedures are necessary at this point. We'll start on antibiotics given the patient's immune compromised status.  Julianne Rice, MD 10/09/13 [phone]

## 2013-10-09 NOTE — ED Notes (Signed)
CRITICAL VALUE ALERT  Critical value received:  CBG >600  Date of notification:  10/09/13  Time of notification:  0216  Critical value read back: Yes  Nurse who received alert:  Elwanda Brooklyn, RN  MD notified: Dr. Lita Mains  Time MD responded:  [phone]

## 2013-10-09 NOTE — ED Notes (Signed)
Pt reporting that today her glucose has been registering high at home. Also c/o a boil to the left breast. States she has had to have surgery for this same problem before.

## 2013-10-09 NOTE — Progress Notes (Signed)
Patient has our cpap unit with her home mask, patient puts on and takes off cpap by herself. Will continue to monitor

## 2013-10-10 ENCOUNTER — Encounter (HOSPITAL_COMMUNITY): Payer: Self-pay

## 2013-10-10 LAB — GLUCOSE, CAPILLARY
GLUCOSE-CAPILLARY: 155 mg/dL — AB (ref 70–99)
GLUCOSE-CAPILLARY: 136 mg/dL — AB (ref 70–99)
GLUCOSE-CAPILLARY: 204 mg/dL — AB (ref 70–99)
Glucose-Capillary: 105 mg/dL — ABNORMAL HIGH (ref 70–99)
Glucose-Capillary: 107 mg/dL — ABNORMAL HIGH (ref 70–99)
Glucose-Capillary: 218 mg/dL — ABNORMAL HIGH (ref 70–99)
Glucose-Capillary: 384 mg/dL — ABNORMAL HIGH (ref 70–99)
Glucose-Capillary: 390 mg/dL — ABNORMAL HIGH (ref 70–99)
Glucose-Capillary: 414 mg/dL — ABNORMAL HIGH (ref 70–99)

## 2013-10-10 LAB — BASIC METABOLIC PANEL
BUN: 12 mg/dL (ref 6–23)
CALCIUM: 8.5 mg/dL (ref 8.4–10.5)
CHLORIDE: 109 meq/L (ref 96–112)
CO2: 25 mEq/L (ref 19–32)
CREATININE: 0.99 mg/dL (ref 0.50–1.10)
GFR calc Af Amer: 79 mL/min — ABNORMAL LOW (ref >90)
GFR calc non Af Amer: 68 mL/min — ABNORMAL LOW (ref >90)
Glucose, Bld: 116 mg/dL — ABNORMAL HIGH (ref 70–99)
Potassium: 3.5 mEq/L — ABNORMAL LOW (ref 3.7–5.3)
Sodium: 144 mEq/L (ref 137–147)

## 2013-10-10 LAB — CBC
HCT: 30.7 % — ABNORMAL LOW (ref 36.0–46.0)
Hemoglobin: 10.6 g/dL — ABNORMAL LOW (ref 12.0–15.0)
MCH: 29.2 pg (ref 26.0–34.0)
MCHC: 34.5 g/dL (ref 30.0–36.0)
MCV: 84.6 fL (ref 78.0–100.0)
Platelets: 163 10*3/uL (ref 150–400)
RBC: 3.63 MIL/uL — ABNORMAL LOW (ref 3.87–5.11)
RDW: 13.9 % (ref 11.5–15.5)
WBC: 4.8 10*3/uL (ref 4.0–10.5)

## 2013-10-10 MED ORDER — INSULIN ASPART 100 UNIT/ML ~~LOC~~ SOLN
25.0000 [IU] | Freq: Three times a day (TID) | SUBCUTANEOUS | Status: DC
  Administered 2013-10-10 – 2013-10-11 (×2): 25 [IU] via SUBCUTANEOUS

## 2013-10-10 MED ORDER — ENOXAPARIN SODIUM 80 MG/0.8ML ~~LOC~~ SOLN: 80.0000 mg | SUBCUTANEOUS | Status: DC

## 2013-10-10 MED ORDER — INSULIN ASPART 100 UNIT/ML ~~LOC~~ SOLN
0.0000 [IU] | Freq: Three times a day (TID) | SUBCUTANEOUS | Status: DC
  Administered 2013-10-10 (×2): 15 [IU] via SUBCUTANEOUS
  Administered 2013-10-11: 11 [IU] via SUBCUTANEOUS
  Administered 2013-10-11: 5 [IU] via SUBCUTANEOUS
  Administered 2013-10-12: 3 [IU] via SUBCUTANEOUS
  Administered 2013-10-12: 15 [IU] via SUBCUTANEOUS
  Administered 2013-10-12: 2 [IU] via SUBCUTANEOUS
  Administered 2013-10-13: 3 [IU] via SUBCUTANEOUS
  Administered 2013-10-13: 5 [IU] via SUBCUTANEOUS
  Administered 2013-10-14: 3 [IU] via SUBCUTANEOUS

## 2013-10-10 MED ORDER — CIPROFLOXACIN IN D5W 400 MG/200ML IV SOLN
400.0000 mg | Freq: Two times a day (BID) | INTRAVENOUS | Status: DC
  Administered 2013-10-10 – 2013-10-14 (×8): 400 mg via INTRAVENOUS
  Filled 2013-10-10 (×7): qty 200

## 2013-10-10 MED ORDER — INSULIN GLARGINE 100 UNIT/ML ~~LOC~~ SOLN
80.0000 [IU] | Freq: Every day | SUBCUTANEOUS | Status: DC
  Administered 2013-10-10: 80 [IU] via SUBCUTANEOUS
  Filled 2013-10-10 (×2): qty 0.8

## 2013-10-10 NOTE — Progress Notes (Signed)
Message left for Dr. Romona Curls to consult.

## 2013-10-10 NOTE — Progress Notes (Signed)
Surgery  48 year old insulin dependant morbidly obese W. Female with Left breast abscess.  Has had Right breast abscess treated by me last year and has presented with another abscess on the left side.  The cellulitis has diminished but she still drainage.  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  Will plan for incision and drainage and debridement in OR in AM.  Procedure and risks explained and informed consent obtained.  Bloodsugar are under control at present and low potassium has been corrected.  Filed Vitals:   10/10/13 1137  BP: 139/87  Pulse: 93  Temp: 97.7 F (36.5 C)  Resp: 16   Consult dictated, dict. # [id number].

## 2013-10-10 NOTE — Progress Notes (Signed)
UR chart review completed.  

## 2013-10-10 NOTE — Progress Notes (Signed)
Subjective: She says she feels much better. She still has some complaints of pain in her left breast. Her blood sugar is better.  Objective: Vital signs in last 24 hours: Temp:  [97.6 F (36.4 C)-98.2 F (36.8 C)] 97.7 F (36.5 C) (03/16 0400) Pulse Rate:  [78-97] 78 (03/16 0615) Resp:  [0-25] 15 (03/16 0615) BP: (105-171)/(56-96) 144/89 mmHg (03/16 0615) SpO2:  [91 %-100 %] 100 % (03/16 0615) Weight:  [165.2 kg (364 lb 3.2 oz)] 165.2 kg (364 lb 3.2 oz) (03/16 0500) Weight change: 5.1 kg (11 lb 3.9 oz) Last BM Date: 10/08/13  Intake/Output from previous day: 03/15 0701 - 03/16 0700 In: 5897.2 [P.O.:2700; I.V.:3197.2] Out: 3450 [Urine:3450]  PHYSICAL EXAM General appearance: alert, cooperative, no distress and morbidly obese Resp: clear to auscultation bilaterally Cardio: regular rate and rhythm, S1, S2 normal, no murmur, click, rub or gallop GI: soft, non-tender; bowel sounds normal; no masses,  no organomegaly Extremities: extremities normal, atraumatic, no cyanosis or edema  Lab Results:    Basic Metabolic Panel:  Recent Labs  10/09/13 0245 10/10/13 0500  NA 126* 144  K 3.7 3.5*  CL 89* 109  CO2 23 25  GLUCOSE 690* 116*  BUN 15 12  CREATININE 0.95 0.99  CALCIUM 8.8 8.5   Liver Function Tests:  Recent Labs  10/09/13 0245  AST <5  ALT <5  ALKPHOS 129*  BILITOT <0.2*  PROT 5.8*  ALBUMIN 2.2*   No results found for this basename: LIPASE, AMYLASE,  in the last 72 hours No results found for this basename: AMMONIA,  in the last 72 hours CBC:  Recent Labs  10/09/13 0245 10/10/13 0500  WBC 6.6 4.8  NEUTROABS 2.7  --   HGB 13.0 10.6*  HCT 36.5 30.7*  MCV 83.5 84.6  PLT 215 163   Cardiac Enzymes: No results found for this basename: CKTOTAL, CKMB, CKMBINDEX, TROPONINI,  in the last 72 hours BNP: No results found for this basename: PROBNP,  in the last 72 hours D-Dimer: No results found for this basename: DDIMER,  in the last 72  hours CBG:  Recent Labs  10/09/13 2347 10/10/13 0058 10/10/13 0201 10/10/13 0254 10/10/13 0456 10/10/13 0613  GLUCAP 205* 218* 155* 136* 105* 107*   Hemoglobin A1C: No results found for this basename: HGBA1C,  in the last 72 hours Fasting Lipid Panel: No results found for this basename: CHOL, HDL, LDLCALC, TRIG, CHOLHDL, LDLDIRECT,  in the last 72 hours Thyroid Function Tests: No results found for this basename: TSH, T4TOTAL, FREET4, T3FREE, THYROIDAB,  in the last 72 hours Anemia Panel: No results found for this basename: VITAMINB12, FOLATE, FERRITIN, TIBC, IRON, RETICCTPCT,  in the last 72 hours Coagulation: No results found for this basename: LABPROT, INR,  in the last 72 hours Urine Drug Screen: Drugs of Abuse  No results found for this basename: labopia, cocainscrnur, labbenz, amphetmu, thcu, labbarb    Alcohol Level: No results found for this basename: ETH,  in the last 72 hours Urinalysis:  Recent Labs  10/09/13 0245  COLORURINE YELLOW  LABSPEC 1.015  PHURINE 6.0  GLUCOSEU >1000*  HGBUR SMALL*  BILIRUBINUR NEGATIVE  KETONESUR NEGATIVE  PROTEINUR >300*  UROBILINOGEN 0.2  NITRITE NEGATIVE  LEUKOCYTESUR NEGATIVE   Misc. Labs:  ABGS No results found for this basename: PHART, PCO2, PO2ART, TCO2, HCO3,  in the last 72 hours CULTURES Recent Results (from the past 240 hour(s))  MRSA PCR SCREENING     Status: None   Collection Time  10/09/13  4:27 AM      Result Value Ref Range Status   MRSA by PCR NEGATIVE  NEGATIVE Final   Comment:            The GeneXpert MRSA Assay (FDA     approved for NASAL specimens     only), is one component of a     comprehensive MRSA colonization     surveillance program. It is not     intended to diagnose MRSA     infection nor to guide or     monitor treatment for     MRSA infections.   Studies/Results: No results found.  Medications:  Prior to Admission:  Prescriptions prior to admission  Medication Sig Dispense  Refill  . albuterol (PROAIR HFA) 108 (90 BASE) MCG/ACT inhaler Inhale 2 puffs into the lungs every 6 (six) hours as needed for wheezing or shortness of breath.      Marland Kitchen albuterol (PROVENTIL) (2.5 MG/3ML) 0.083% nebulizer solution Take 2.5 mg by nebulization 4 (four) times daily.       Marland Kitchen ALPRAZolam (XANAX) 1 MG tablet Take 1 mg by mouth 4 (four) times daily.        Marland Kitchen amLODipine (NORVASC) 10 MG tablet Take 10 mg by mouth daily.      Marland Kitchen HYDROcodone-acetaminophen (NORCO) 10-325 MG per tablet Take 1 tablet by mouth every 6 (six) hours as needed for pain.      Marland Kitchen insulin aspart (NOVOLOG) 100 UNIT/ML injection Inject 10-20 Units into the skin 3 (three) times daily. Inject under skin 25 units before meals and hold if skips meal.  Do not give at bedtime  Correction AC (G-120) 2520      . Insulin Glargine (LANTUS SOLOSTAR) 100 UNIT/ML SOPN Inject 80 Units into the skin. Inject under skin 80 units at bedtime (divide into 2 equal injections)      . lisinopril-hydrochlorothiazide (PRINZIDE,ZESTORETIC) 20-12.5 MG per tablet Take 1 tablet by mouth 2 (two) times daily.       . metFORMIN (GLUCOPHAGE) 1000 MG tablet Take 1,000 mg by mouth 2 (two) times daily with a meal.      . omeprazole (PRILOSEC) 20 MG capsule Take 20 mg by mouth 2 (two) times daily.       Marland Kitchen rOPINIRole (REQUIP) 1 MG tablet Take 1 mg by mouth at bedtime.       . simvastatin (ZOCOR) 40 MG tablet Take 40 mg by mouth every evening.      Marland Kitchen UNKNOWN TO PATIENT Apply 1 application topically daily as needed (for irritation (vaginal area) COMPOUND MEDICATION: Ketoconazole mixed with another medication  (name is unknown)).       Scheduled: . ALPRAZolam  1 mg Oral QID  . amLODipine  10 mg Oral Daily  . atorvastatin  20 mg Oral q1800  . enoxaparin (LOVENOX) injection  80 mg Subcutaneous Q24H  . insulin aspart  0-15 Units Subcutaneous TID WC  . insulin glargine  80 Units Subcutaneous QHS  . ipratropium-albuterol  3 mL Nebulization TID  . pantoprazole  40 mg  Oral Daily  . predniSONE  5 mg Oral BID WC  . rOPINIRole  1 mg Oral QHS  . sodium chloride  3 mL Intravenous Q12H   Continuous: . sodium chloride 125 mL/hr at 10/09/13 2357   [id number], acetaminophen, albuterol, HYDROcodone-acetaminophen, HYDROmorphone (DILAUDID) injection, ondansetron (ZOFRAN) IV, ondansetron  Assesment: She was admitted with uncontrolled diabetes. She has morbid obesity. She has what appears to be a breast  abscess. She has had multiple abscesses in the past and they frequently cause her to have trouble with blood sugar control Active Problems:   Hyperglycemia   Uncontrolled diabetes mellitus    Plan: Surgical consultation. Continue other treatments.    LOS: 1 day   Preslea Rhodus L 10/10/2013, 8:23 AM

## 2013-10-10 NOTE — Progress Notes (Signed)
Inpatient Diabetes Program Recommendations  AACE/ADA: New Consensus Statement on Inpatient Glycemic Control (2013)  Target Ranges:  Prepandial:   less than 140 mg/dL      Peak postprandial:   less than 180 mg/dL (1-2 hours)      Critically ill patients:  140 - 180 mg/dL   Results for ANBER, KARPENKO (MRN [id number]) as of 10/10/2013 13:51  Ref. Range 10/10/2013 00:58 10/10/2013 02:01 10/10/2013 02:54 10/10/2013 04:56 10/10/2013 06:13 10/10/2013 08:26 10/10/2013 11:34  Glucose-Capillary Latest Range: 70-99 mg/dL 218 (H) 155 (H) 136 (H) 105 (H) 107 (H) 204 (H) 390 (H)   Diabetes history: DM2 Outpatient Diabetes medications: Lantus 80 units (given in two divided doses), Novolog 25 units TID with meal, Novolog 10-20 units TID correction scale, and Metformin 1000 mg BID Current orders for Inpatient glycemic control: Lantus 80 units QHS, Novolog 0-15 units AC  Inpatient Diabetes Program Recommendations Insulin - Basal: Please consider changing Lantus to be given now by changing order to Lantus 80 units Q24H (or Lantus 40 units BID) since the patient did not receive any basal insulin since transitioning off the insulin drip. Insulin - Meal Coverage: Since diet has been resumed, may want to consider ordering Novolog meal coverage.  Note: Noted patient was transitioned of insulin drip this morning around 6:15 am and CBG at that time was 107 mg/dl.  At time of transition off IV insulin, patient was not given any basal insulin. CBG noted to be 204 this morning at 8:26 am and 390 mg/dl at 11:34 am.  Georgiann Mohs, RN to discuss.  Jan reports that since patient was NPO she held 8am Novolog correction since she was unsure of surgery plan for today.  Patient was given Novolog 15 units at 12:35 for CBG of 390 mg/dl at 11:34.  Asked that Jan, RN call Dr. Luan Pulling to request basal insulin be given now with change to Lantus 80 units Q24H or Lantus 40 units BID.  Jan, RN reports that patient has resumed diet since surgery will  not be done today but will likely be NPO after midnight.  Asked that she ask Dr. Luan Pulling about adding meal coverage while diet is ordered.  Will continue to follow.  Thanks, Barnie Alderman, RN, MSN, CCRN Diabetes Coordinator Inpatient Diabetes Program [phone] (Team Pager) [phone] ([facility] office) [phone] [facility] office)

## 2013-10-10 NOTE — H&P (Signed)
NAMEMarland Kitchen  Martha, [name]                 ACCOUNT NO.:  [id number]  MEDICAL RECORD NO.:  [id number]  LOCATION:  IC10                          FACILITY:  [facility]  PHYSICIAN:  Carlisa Eble L. Luan Pulling, M.D.DATE OF BIRTH:  1965/11/05  DATE OF ADMISSION:  10/09/2013 DATE OF DISCHARGE:  LH                             HISTORY & PHYSICAL   REASON FOR ADMISSION:  Uncontrolled blood sugar.  HISTORY:  [name] is a 48 year old, who has a long known history of diabetes and who was in her usual state of health when she had what she thinks is an abscess under her left breast.  In the past, this has caused her to have difficulty with control of her blood sugar.  She has some spontaneous drainage but still feels like it is enlarging.  She has not had any other changes.  PAST MEDICAL HISTORY:  Positive for: 1. Diabetes. 2. Hypertension. 3. Restless legs syndrome. 4. Bipolar disease. 5. Obstructive sleep apnea. 6. Morbid obesity. 7. GERD. 8. Depression. 9. Previous history of anemia.  SURGICAL HISTORY:  She has had a tubal ligation, endometrial ablation, EGD and colonoscopy, Savary dilatation, banding of hemorrhoids, and had I and D of an abscess of her right breast about 6 months ago.  FAMILY HISTORY:  Unknown.  SOCIAL HISTORY:  She has about a 40 pack year smoking history but stopped about 6 years ago.  She does not use any alcohol.  REVIEW OF SYSTEMS:  She has not had any fever, chills, but has had fatigue.  She has not had any cough or shortness of breath.  No chest pain.  No nausea, vomiting, diarrhea.  She has had increasing urine output.  She complains of numbness of the thumb and first 2 fingers of both hands.  MEDICATIONS:  At home: 1. She takes albuterol inhaler as needed. 2. Albuterol nebulizer as needed. 3. Xanax 1 mg q.i.d. 4. Amlodipine 10 mg daily. 5. Norco 10/325 q.6 hours p.r.n. pain. 6. NovoLog insulin she uses a sliding scale and gives 25 units before     meals. 7. She takes  Lantus 80 units at bedtime. 8. Lisinopril/hydrochlorothiazide 20/12.5 one b.i.d. 9. Metformin 1000 mg b.i.d. 10.Omeprazole 20 mg b.i.d. 11.She had been on prednisone but is not now. 12.Ropinirole 1 mg at bedtime. 13.Simvastatin 40 mg at bedtime. 14.She is also using some sort of a vaginal cream and is not totally     clear what that is.  PHYSICAL EXAMINATION:  GENERAL:  She is awake and alert.  She is morbidly obese. HEENT:  Her pupils are reactive.  Nose and throat are clear.  Mucous membranes are moist. NECK:  Supple without masses. CHEST:  Clear. HEART:  Regular without gallop. ABDOMEN:  Soft. EXTREMITIES:  No edema.  She has a what feels like an abscess that is about 5 cm underneath her left breast that does have some spontaneous drainage but still has fullness.  ASSESSMENT:  She has uncontrolled diabetes probably related to the breast abscess.  PLAN:  For her to have consultation with General Surgery.  Continue with her other treatments.     Sheelah Ritacco L. Luan Pulling, M.D.     ELH/MEDQ  D:  10/10/2013  T:  10/10/2013  Job:  [id number]

## 2013-10-11 ENCOUNTER — Encounter (HOSPITAL_COMMUNITY)
Admission: EM | Disposition: A | Discharge status: Home / Self Care | Payer: Self-pay | Source: Home / Self Care | Attending: Pulmonary Disease

## 2013-10-11 ENCOUNTER — Encounter (HOSPITAL_COMMUNITY): Payer: Self-pay | Admitting: *Deleted

## 2013-10-11 ENCOUNTER — Encounter (HOSPITAL_COMMUNITY): Payer: Medicaid Other | Admitting: Anesthesiology

## 2013-10-11 ENCOUNTER — Inpatient Hospital Stay (HOSPITAL_COMMUNITY): Payer: Medicaid Other | Admitting: Anesthesiology

## 2013-10-11 HISTORY — PX: INCISION AND DRAINAGE ABSCESS: SHX5864

## 2013-10-11 LAB — GLUCOSE, CAPILLARY
GLUCOSE-CAPILLARY: 340 mg/dL — AB (ref 70–99)
GLUCOSE-CAPILLARY: 248 mg/dL — AB (ref 70–99)
Glucose-Capillary: 415 mg/dL — ABNORMAL HIGH (ref 70–99)

## 2013-10-11 LAB — GLUCOSE, RANDOM: Glucose, Bld: 525 mg/dL — ABNORMAL HIGH (ref 70–99)

## 2013-10-11 SURGERY — INCISION AND DRAINAGE, ABSCESS
Anesthesia: General | Site: Breast | Laterality: Left

## 2013-10-11 MED ORDER — FENTANYL CITRATE 0.05 MG/ML IJ SOLN: 25.0000 ug | INTRAMUSCULAR | Status: DC | PRN

## 2013-10-11 MED ORDER — ONDANSETRON HCL 4 MG/2ML IJ SOLN
4.0000 mg | Freq: Once | INTRAMUSCULAR | Status: AC
  Administered 2013-10-11: 4 mg via INTRAVENOUS

## 2013-10-11 MED ORDER — STERILE WATER FOR IRRIGATION IR SOLN
Status: DC | PRN
  Administered 2013-10-11: 1000 mL

## 2013-10-11 MED ORDER — PROPOFOL 10 MG/ML IV BOLUS
INTRAVENOUS | Status: AC
  Filled 2013-10-11: qty 20

## 2013-10-11 MED ORDER — LIDOCAINE HCL (PF) 1 % IJ SOLN
INTRAMUSCULAR | Status: AC
  Filled 2013-10-11: qty 5

## 2013-10-11 MED ORDER — INSULIN GLARGINE 100 UNIT/ML ~~LOC~~ SOLN
90.0000 [IU] | Freq: Every day | SUBCUTANEOUS | Status: DC
  Administered 2013-10-11 – 2013-10-13 (×3): 90 [IU] via SUBCUTANEOUS
  Filled 2013-10-11 (×6): qty 0.9

## 2013-10-11 MED ORDER — MIDAZOLAM HCL 2 MG/2ML IJ SOLN
1.0000 mg | INTRAMUSCULAR | Status: DC | PRN
  Administered 2013-10-11: 2 mg via INTRAVENOUS

## 2013-10-11 MED ORDER — LACTATED RINGERS IV SOLN
INTRAVENOUS | Status: DC
  Administered 2013-10-11: 1000 mL via INTRAVENOUS

## 2013-10-11 MED ORDER — INSULIN ASPART 100 UNIT/ML ~~LOC~~ SOLN
30.0000 [IU] | Freq: Three times a day (TID) | SUBCUTANEOUS | Status: DC
  Administered 2013-10-11 – 2013-10-12 (×4): 30 [IU] via SUBCUTANEOUS

## 2013-10-11 MED ORDER — ENOXAPARIN SODIUM 80 MG/0.8ML ~~LOC~~ SOLN
80.0000 mg | SUBCUTANEOUS | Status: DC
  Administered 2013-10-11 – 2013-10-13 (×3): 80 mg via SUBCUTANEOUS
  Filled 2013-10-11 (×3): qty 0.8

## 2013-10-11 MED ORDER — LACTATED RINGERS IV SOLN
INTRAVENOUS | Status: DC | PRN
  Administered 2013-10-11: 07:00:00 via INTRAVENOUS

## 2013-10-11 MED ORDER — FENTANYL CITRATE 0.05 MG/ML IJ SOLN
INTRAMUSCULAR | Status: AC
  Filled 2013-10-11: qty 5

## 2013-10-11 MED ORDER — PROPOFOL 10 MG/ML IV BOLUS
INTRAVENOUS | Status: DC | PRN
  Administered 2013-10-11: 150 mg via INTRAVENOUS

## 2013-10-11 MED ORDER — SUCCINYLCHOLINE CHLORIDE 20 MG/ML IJ SOLN
INTRAMUSCULAR | Status: AC
  Filled 2013-10-11: qty 1

## 2013-10-11 MED ORDER — ONDANSETRON HCL 4 MG/2ML IJ SOLN
INTRAMUSCULAR | Status: AC
  Filled 2013-10-11: qty 2

## 2013-10-11 MED ORDER — 0.9 % SODIUM CHLORIDE (POUR BTL) OPTIME
TOPICAL | Status: DC | PRN
  Administered 2013-10-11: 1000 mL

## 2013-10-11 MED ORDER — SUCCINYLCHOLINE CHLORIDE 20 MG/ML IJ SOLN
INTRAMUSCULAR | Status: DC | PRN
  Administered 2013-10-11: 120 mg via INTRAVENOUS

## 2013-10-11 MED ORDER — LIDOCAINE HCL (PF) 1 % IJ SOLN
INTRAMUSCULAR | Status: AC
  Filled 2013-10-11: qty 10

## 2013-10-11 MED ORDER — LIDOCAINE HCL (CARDIAC) 20 MG/ML IV SOLN
INTRAVENOUS | Status: DC | PRN
  Administered 2013-10-11: 50 mg via INTRAVENOUS

## 2013-10-11 MED ORDER — MIDAZOLAM HCL 2 MG/2ML IJ SOLN
INTRAMUSCULAR | Status: AC
  Filled 2013-10-11: qty 2

## 2013-10-11 MED ORDER — FENTANYL CITRATE 0.05 MG/ML IJ SOLN
INTRAMUSCULAR | Status: DC | PRN
  Administered 2013-10-11 (×3): 50 ug via INTRAVENOUS
  Administered 2013-10-11: 100 ug via INTRAVENOUS

## 2013-10-11 MED ORDER — ONDANSETRON HCL 4 MG/2ML IJ SOLN: 4.0000 mg | Freq: Once | INTRAMUSCULAR | Status: DC | PRN

## 2013-10-11 SURGICAL SUPPLY — 28 items
BAG HAMPER (MISCELLANEOUS) ×2 IMPLANT
BNDG CONFORM 2 STRL LF (GAUZE/BANDAGES/DRESSINGS) IMPLANT
CLOTH BEACON ORANGE TIMEOUT ST (SAFETY) ×2 IMPLANT
COVER LIGHT HANDLE STERIS (MISCELLANEOUS) ×4 IMPLANT
ELECT REM PT RETURN 9FT ADLT (ELECTROSURGICAL) ×2
ELECTRODE REM PT RTRN 9FT ADLT (ELECTROSURGICAL) ×1 IMPLANT
GLOVE BIOGEL PI IND STRL 7.0 (GLOVE) ×1 IMPLANT
GLOVE BIOGEL PI INDICATOR 7.0 (GLOVE) ×1
GLOVE ECLIPSE 6.5 STRL STRAW (GLOVE) ×2 IMPLANT
GLOVE EXAM NITRILE MD LF STRL (GLOVE) ×2 IMPLANT
GLOVE SKINSENSE NS SZ7.0 (GLOVE) ×2
GLOVE SKINSENSE STRL SZ7.0 (GLOVE) ×2 IMPLANT
GOWN STRL REUS W/TWL LRG LVL3 (GOWN DISPOSABLE) ×4 IMPLANT
KIT ROOM TURNOVER APOR (KITS) ×2 IMPLANT
MANIFOLD NEPTUNE II (INSTRUMENTS) ×2 IMPLANT
MARKER SKIN DUAL TIP RULER LAB (MISCELLANEOUS) ×2 IMPLANT
NS IRRIG 1000ML POUR BTL (IV SOLUTION) ×2 IMPLANT
PACK BASIC LIMB (CUSTOM PROCEDURE TRAY) IMPLANT
PACK MINOR (CUSTOM PROCEDURE TRAY) ×2 IMPLANT
PAD ABD 5X9 TENDERSORB (GAUZE/BANDAGES/DRESSINGS) ×2 IMPLANT
PAD ARMBOARD 7.5X6 YLW CONV (MISCELLANEOUS) ×2 IMPLANT
SET BASIN LINEN APH (SET/KITS/TRAYS/PACK) ×2 IMPLANT
SPONGE GAUZE 4X4 12PLY (GAUZE/BANDAGES/DRESSINGS) ×2 IMPLANT
SWAB CULTURE LIQ STUART DBL (MISCELLANEOUS) ×2 IMPLANT
SYR BULB IRRIGATION 50ML (SYRINGE) ×2 IMPLANT
TAPE CLOTH SURG 4X10 WHT LF (GAUZE/BANDAGES/DRESSINGS) ×2 IMPLANT
TOWEL OR 17X26 4PK STRL BLUE (TOWEL DISPOSABLE) IMPLANT
WATER STERILE IRR 1000ML POUR (IV SOLUTION) ×4 IMPLANT

## 2013-10-11 NOTE — Anesthesia Procedure Notes (Signed)
Procedure Name: Intubation Date/Time: 10/11/2013 7:43 AM Performed by: Andree Elk, AMY A Pre-anesthesia Checklist: Patient identified, Patient being monitored, Timeout performed, Emergency Drugs available and Suction available Patient Re-evaluated:Patient Re-evaluated prior to inductionOxygen Delivery Method: Circle System Utilized Preoxygenation: Pre-oxygenation with 100% oxygen Intubation Type: IV induction, Rapid sequence and Cricoid Pressure applied Ventilation: Mask ventilation without difficulty Laryngoscope Size: 3 and Miller Grade View: Grade I Tube type: Oral Tube size: 7.0 mm Number of attempts: 1 Airway Equipment and Method: stylet Placement Confirmation: ETT inserted through vocal cords under direct vision,  positive ETCO2 and breath sounds checked- equal and bilateral Secured at: 21 cm Tube secured with: Tape Dental Injury: Teeth and Oropharynx as per pre-operative assessment

## 2013-10-11 NOTE — Consult Note (Signed)
NAME:  KEYWANNA, WENIG NO.:  [id number]  MEDICAL RECORD NO.:  [id number]  LOCATION:  IC10                          FACILITY:  [facility]  PHYSICIAN:  Felicie Morn, M.D. DATE OF BIRTH:  31-Aug-1965  DATE OF CONSULTATION:  10/10/2013 DATE OF DISCHARGE:                                CONSULTATION   NOTE:  This is a 48 year old white female who is known to me in the past for a right breast abscess.  She has been admitted to Dr. Luan Pulling Service for recurrent abscess this time on the left side.  She was admitted and stabilized by the Medical Service as her blood sugars were out of control.  These are now currently under control and her electrolyte imbalance has been corrected.  I have reviewed her chart and examined her.  In essence, she has an area on the medial aspect of her left breast, which is draining and shows areas of perhaps a localized area of hydradenitis suppurativa with continuous drainage.  Cellulitis is really minimal.  At this point, antibiotics have been initiated and as she is stable, we will take her to surgery, where this will be incised and drained and debrided of the affected apocrine tissue causing this problem.  We discussed this in detail with her.  She understands this as she has already undergone this procedure essentially in the past.  I have reviewed her chart.  Other medical problems besides her insulin-dependent diabetes have include the following; morbid obesity, hypertension, bipolar disease. She suffers from GERD and depression, and she does have some sleep apnea problems as well.  We will plan for surgery on her in the morning.  She has been taken off Lovenox in preparation for this and we will plan for surgery in the morning.  We discussed this in detail with her discussing complications, not limited to, but including bleeding, infection, and recurrence and informed consent was obtained.     Felicie Morn,  M.D.     WB/MEDQ  D:  10/10/2013  T:  10/11/2013  Job:  [id number]  cc:   Percell Miller L. Luan Pulling, M.D. Fax: [phone]

## 2013-10-11 NOTE — Op Note (Signed)
NAME:  Martha [name], Martha [name] NO.:  [id number]  MEDICAL RECORD NO.:  [id number]  LOCATION:  APPO                          FACILITY:  [facility]  PHYSICIAN:  Felicie Morn, M.D. DATE OF BIRTH:  [birth year]  DATE OF PROCEDURE:  10/11/2013 DATE OF DISCHARGE:                              OPERATIVE REPORT   DIAGNOSIS:  Left breast abscess secondary to possible hidradenitis suppurativa.  NOTE:  This is a 48 year old morbidly obese, diabetic who presented to the Medical Service with her blood sugars out of control, and an abscess on her left breast.  Surgery was consulted.  I had seen her last year for almost the identical situation occurring in the right breast where she had an area of her breast excised and packed open.  We discussed the need to do the same procedure with her at this time.  We discussed complications, not limited to, but including bleeding, infection, and recurrence.  Informed consent was obtained.  GROSS OPERATIVE FINDINGS:  The patient had an area of induration and bloody like discharge from her left breast.  This was cultured and packed open.  Wound classification contaminated.  TECHNIQUE:  The patient was placed in the supine position.  After the adequate administration of general anesthesia via endotracheal intubation, her left breast was prepped with Betadine solution and draped in usual manner.  An elliptical incision was carried out over the area of infected breast, this was removed and sent as a specimen.  After cultures were obtained, the wound was then irrigated with normal saline solution and packed with iodoform.  A half-inch iodoform gauze had been soaked in saline.  A sterile dressing was applied.  Prior to closure, all sponge, needle, and instrument counts to be correct.  Estimated blood loss was less than 20 mL.  There were no complications.  The patient was taken to the recovery room in satisfactory condition.  This patient will remain on  Dr. Luan Pulling' service for continued blood sugar monitoring.  We will also obtain a consult for outpatient wound care.     Felicie Morn, M.D.     WB/MEDQ  D:  10/11/2013  T:  10/11/2013  Job:  [id number]  cc:   Percell Miller L. Luan Pulling, M.D. Fax: [phone]

## 2013-10-11 NOTE — Progress Notes (Signed)
Surgery  Filed Vitals:   10/11/13 0700  BP: 161/95  Pulse:   Temp:   Resp: 19  temp:97.6, pulse 93. O2 sat 96% on RA.  BS is still elevated, will co ordinate with Anesthesia.  This will improve after I&D  And debridement.

## 2013-10-11 NOTE — Anesthesia Preprocedure Evaluation (Addendum)
Anesthesia Evaluation  Patient identified by MRN, date of birth, ID band Patient awake    Reviewed: Allergy & Precautions, H&P , NPO status , Patient's Chart, lab work & pertinent test results, reviewed documented beta blocker date and time   Airway Mallampati: II TM Distance: >3 FB Neck ROM: Full    Dental  (+) Teeth Intact, Poor Dentition, Chipped,    Pulmonary sleep apnea and Continuous Positive Airway Pressure Ventilation , COPDformer smoker,  breath sounds clear to auscultation        Cardiovascular hypertension, Pt. on medications Rhythm:Regular Rate:Normal     Neuro/Psych PSYCHIATRIC DISORDERS Depression Bipolar Disorder    GI/Hepatic GERD-  Medicated and Controlled,  Endo/Other  diabetes, Poorly Controlled, Type 2, Insulin DependentMorbid obesity  Renal/GU      Musculoskeletal   Abdominal   Peds  Hematology   Anesthesia Other Findings   Reproductive/Obstetrics                          Anesthesia Physical Anesthesia Plan  ASA: III  Anesthesia Plan: General   Post-op Pain Management:    Induction: Intravenous, Rapid sequence and Cricoid pressure planned  Airway Management Planned: Oral ETT  Additional Equipment:   Intra-op Plan:   Post-operative Plan: Extubation in OR  Informed Consent: I have reviewed the patients History and Physical, chart, labs and discussed the procedure including the risks, benefits and alternatives for the proposed anesthesia with the patient or authorized representative who has indicated his/her understanding and acceptance.   Dental advisory given  Plan Discussed with:   Anesthesia Plan Comments: (Hyperglycemia  is slowly improving will continue to monitor.)       Anesthesia Quick Evaluation

## 2013-10-11 NOTE — Brief Op Note (Signed)
10/09/2013 - 10/11/2013  8:22 AM  PATIENT:  Martha [name]  48 y.o. female  PRE-OPERATIVE DIAGNOSIS:  left breast abscess  POST-OPERATIVE DIAGNOSIS:  left breast abscess  PROCEDURE:  Procedure(s): INCISION AND DRAINAGE DEBRIDEMENT LEFT BREAST  ABSCESS (Left)  SURGEON:  Surgeon(s) and Role:    * [name], MD - Primary  PHYSICIAN ASSISTANT:   ASSISTANTS: none   ANESTHESIA:   general  EBL:     BLOOD ADMINISTERED:none  DRAINS: none   LOCAL MEDICATIONS USED:  NONE  SPECIMEN:  Source of Specimen:  Left breast abscess and culture.  DISPOSITION OF SPECIMEN:  PATHOLOGY  COUNTS:  YES  TOURNIQUET:  * No tourniquets in log *  DICTATION: .Other Dictation: Dictation Number OR dict.[id number].  PLAN OF CARE: Admit to inpatient   PATIENT DISPOSITION:  PACU - hemodynamically stable.   Delay start of Pharmacological VTE agent (>24hrs) due to surgical blood loss or risk of bleeding: yes

## 2013-10-11 NOTE — Progress Notes (Signed)
Post OP Check  Dressings dry and in tact and pt has minimal pain.  Blood sugar now down in 200"s.  Have coordinated wound care for 9:00 AM weith wound care service at which time we will instruct son as to dressing changes.  This should not be a problem as he helped last year with post op wound abscess of the right breast.  Filed Vitals:   10/11/13 1200  BP:   Pulse:   Temp:   Resp: 16  temp 97.8, BP 140/62  Pt can be discharged after dressing change in AM as per DR. Hawkins.

## 2013-10-11 NOTE — Anesthesia Postprocedure Evaluation (Signed)
  Anesthesia Post-op Note  Patient: Martha [name]  Procedure(s) Performed: Procedure(s): INCISION AND DRAINAGE DEBRIDEMENT LEFT BREAST  ABSCESS (Left)  Patient Location: PACU  Anesthesia Type:General  Level of Consciousness: awake, alert , oriented and patient cooperative  Airway and Oxygen Therapy: Patient Spontanous Breathing and Patient connected to face mask oxygen  Post-op Pain: none  Post-op Assessment: Post-op Vital signs reviewed, Patient's Cardiovascular Status Stable, Respiratory Function Stable, Patent Airway, No signs of Nausea or vomiting and Pain level controlled  Post-op Vital Signs: Reviewed and stable  Complications: No apparent anesthesia complications

## 2013-10-11 NOTE — Progress Notes (Signed)
Inpatient Diabetes Program Recommendations  AACE/ADA: New Consensus Statement on Inpatient Glycemic Control (2013)  Target Ranges:  Prepandial:   less than 140 mg/dL      Peak postprandial:   less than 180 mg/dL (1-2 hours)      Critically ill patients:  140 - 180 mg/dL   Results for Martha [name], Martha [name] (MRN [id number]) as of 10/11/2013 14:11  Ref. Range 10/10/2013 08:26 10/10/2013 11:34 10/10/2013 16:22 10/10/2013 21:00 10/11/2013 08:28 10/11/2013 11:42  Glucose-Capillary Latest Range: 70-99 mg/dL 204 (H) 390 (H) 414 (H) 384 (H) 415 (H) 340 (H)   Diabetes history: DM2  Outpatient Diabetes medications: Lantus 80 units (given in two divided doses), Novolog 25 units TID with meal, Novolog 10-20 units TID correction scale, and Metformin 1000 mg BID  Current orders for Inpatient glycemic control: Lantus 80 units QHS, Novolog 0-15 units AC, Novolog 25 units TID with meals  Inpatient Diabetes Program Recommendations Insulin - Basal: Please consider increasing Lantus to 90 units QHS. Correction (SSI): Please consider increasing Novolog correction to resistant scale and adding Novolog bedtime correction scale. Insulin - Meal Coverage: Please consider increasing Novolog meal coverage to 30 units TID with meals.  Thanks, Barnie Alderman, RN, MSN, CCRN Diabetes Coordinator Inpatient Diabetes Program [phone] (Team Pager) [phone] ([facility] office) [phone] [facility] office)

## 2013-10-11 NOTE — Progress Notes (Addendum)
PT TRANSFERRING TO ROOM 201. PT ALERT AND ORIENTED.SKIN WARM AND DRY. LT BREAST DRG CLEAN DRY AND INTACT. LT FORE IV PATENT. DENIES ANY PAIN OR DISCOMFORT.. SUPPLIES FOR DRSG CHANGE IN AM SENT WITH PT. TRANSFER REPORT CALLED TO CECILY ALSTON RN ON 200.

## 2013-10-11 NOTE — Transfer of Care (Signed)
Immediate Anesthesia Transfer of Care Note  Patient: Martha [name]  Procedure(s) Performed: Procedure(s): INCISION AND DRAINAGE DEBRIDEMENT LEFT BREAST  ABSCESS (Left)  Patient Location: PACU  Anesthesia Type:General  Level of Consciousness: awake, alert , oriented and patient cooperative  Airway & Oxygen Therapy: Patient Spontanous Breathing and Patient connected to face mask oxygen  Post-op Assessment: Report given to PACU RN and Post -op Vital signs reviewed and stable  Post vital signs: Reviewed and stable  Complications: No apparent anesthesia complications

## 2013-10-11 NOTE — Preoperative (Signed)
Beta Blockers   Reason not to administer Beta Blockers:Not Applicable 

## 2013-10-12 ENCOUNTER — Inpatient Hospital Stay (HOSPITAL_COMMUNITY): Payer: Medicaid Other

## 2013-10-12 LAB — GLUCOSE, CAPILLARY
GLUCOSE-CAPILLARY: 354 mg/dL — AB (ref 70–99)
GLUCOSE-CAPILLARY: 148 mg/dL — AB (ref 70–99)
Glucose-Capillary: 472 mg/dL — ABNORMAL HIGH (ref 70–99)
Glucose-Capillary: 354 mg/dL — ABNORMAL HIGH (ref 70–99)
Glucose-Capillary: 182 mg/dL — ABNORMAL HIGH (ref 70–99)
Glucose-Capillary: 128 mg/dL — ABNORMAL HIGH (ref 70–99)

## 2013-10-12 MED ORDER — FUROSEMIDE 10 MG/ML IJ SOLN
40.0000 mg | Freq: Once | INTRAMUSCULAR | Status: AC
  Administered 2013-10-12: 40 mg via INTRAVENOUS
  Filled 2013-10-12: qty 4

## 2013-10-12 MED ORDER — DOXYCYCLINE HYCLATE 50 MG PO CAPS: 100.0000 mg | ORAL_CAPSULE | Freq: Two times a day (BID) | ORAL | Status: DC

## 2013-10-12 NOTE — Progress Notes (Signed)
CXR results called to Dr. Luan Pulling.  Order given for Lasix 40 mg IV.

## 2013-10-12 NOTE — Addendum Note (Signed)
Addendum created 10/12/13 1034 by Vista Deck, CRNA   Modules edited: Notes Section   Notes Section:  File: [id number]

## 2013-10-12 NOTE — Progress Notes (Signed)
Patient's IV fluids 125/ml/hr.  Dr. Luan Pulling notified.  Gave order to decrease to [facility].

## 2013-10-12 NOTE — Progress Notes (Signed)
RN to patient's room.  She had previously stated she was ready for discharge earlier and Dr. Luan Pulling notified.  Patient states she feels like she "it is like she has something in her throat and cannot get it our when she coughs."  O2 sats on RA 94-95%.  Pt had breathing tx per MAR.  Did not take morning breathing tx.  Pulse 115-119.  Breathing heavy.  Lunch time CBG taken 182.  Dr. Luan Pulling notified.  CXR ordered.  Discharge cancelled.  Patient resting in bed.  Will continue to monitor.

## 2013-10-12 NOTE — Anesthesia Postprocedure Evaluation (Signed)
  Anesthesia Post-op Note  Patient: Martha [name]   Procedure(s) Performed: Procedure(s): INCISION AND DRAINAGE AND DEBRIDEMENT LEFT BREAST  ABSCESS (Left)  Patient Location: 203  Anesthesia Type:General  Level of Consciousness: awake  Airway and Oxygen Therapy: Patient Spontanous Breathing  Post-op Pain: mild  Post-op Assessment: Post-op Vital signs reviewed, Patient's Cardiovascular Status Stable, Respiratory Function Stable, No signs of Nausea or vomiting, Adequate PO intake and Pain level controlled  Post-op Vital Signs: Reviewed and stable  Complications: No apparent anesthesia complications

## 2013-10-12 NOTE — Consult Note (Addendum)
WOC wound consult note Reason for Consult: Consult requested to assist Dr Romona Curls with first post-op dressing change to left breast; he was detained and unable to be present. Wound type: Full thickness post-op incision. Measurement: 2X4X4cm Wound bed: Beefy red Drainage (amount, consistency, odor) Mod amt red drainage, no odor Periwound: Intact skin surrounding Dressing procedure/placement/frequency: Packed with moist gauze.  Pt medicated for pain prior to procedure and tolerated well with minimal discomfort. Demonstrated procedure for packing wound with moist gauze to pt and son. Both state they are familiar with procedure from previous wound to right breast which healed and deny further questions regarding home care. Please re-consult if further assistance is needed.  Thank-you,  Julien Girt MSN, [place], [place], [place], [place]

## 2013-10-12 NOTE — Progress Notes (Signed)
POD#1  Filed Vitals:   10/12/13 0643  BP: 146/84  Pulse: 106  Temp: 97.5 F (36.4 C)  Resp: 20   Wound clean and dry.  Dressing change understood by pt"s son who took care of this before.  Pt will call office for follow up appointment.    Pt will be discharged as per Dr. Luan Pulling.  Blood sugars are still in high 200's and pt C/O breathing problems.  These will be addressed by med service prior to discharge.

## 2013-10-12 NOTE — Progress Notes (Signed)
Inpatient Diabetes Program Recommendations  AACE/ADA: New Consensus Statement on Inpatient Glycemic Control (2013)  Target Ranges:  Prepandial:   less than 140 mg/dL      Peak postprandial:   less than 180 mg/dL (1-2 hours)      Critically ill patients:  140 - 180 mg/dL   Results for DEVEAH, WILLMS (MRN [id number]) as of 10/12/2013 09:38  Ref. Range 10/10/2013 06:13 10/10/2013 08:26 10/10/2013 11:34 10/10/2013 16:22 10/10/2013 21:00 10/11/2013 08:28 10/11/2013 11:42 10/11/2013 16:10  Glucose-Capillary Latest Range: 70-99 mg/dL 107 (H) 204 (H) 390 (H) 414 (H) 384 (H) 415 (H) 340 (H) 248 (H)   Diabetes history: DM2  Outpatient Diabetes medications: Lantus 80 units (given in two divided doses), Novolog 25 units TID with meal, Novolog 10-20 units TID correction scale, and Metformin 1000 mg BID  Current orders for Inpatient glycemic control: Lantus 90 units QHS, Novolog 0-15 units AC, Novolog 30 units TID with meals  Inpatient Diabetes Program Recommendations Insulin - Basal: Please consider increasing Lantus to 100 units QHS (may want to consider splitting in two doses and giving Lantus 50 units BID). Correction (SSI): Please increase Novolog correction scale to Resistant scale and add BEDTIME correction (recommend using same resistant scale ACHS versus using the bedtime correction scale).  Thanks, Barnie Alderman, RN, MSN, CCRN Diabetes Coordinator Inpatient Diabetes Program [phone] (Team Pager) [phone] ([facility] office) [phone] [facility] office)

## 2013-10-13 ENCOUNTER — Encounter (HOSPITAL_COMMUNITY): Payer: Self-pay | Admitting: General Surgery

## 2013-10-13 DIAGNOSIS — I059 Rheumatic mitral valve disease, unspecified: Secondary | ICD-10-CM

## 2013-10-13 LAB — GLUCOSE, CAPILLARY
GLUCOSE-CAPILLARY: 118 mg/dL — AB (ref 70–99)
GLUCOSE-CAPILLARY: 236 mg/dL — AB (ref 70–99)

## 2013-10-13 LAB — BASIC METABOLIC PANEL
BUN: 11 mg/dL (ref 6–23)
CO2: 27 mEq/L (ref 19–32)
CREATININE: 1 mg/dL (ref 0.50–1.10)
Calcium: 8.7 mg/dL (ref 8.4–10.5)
Chloride: 106 mEq/L (ref 96–112)
GFR calc Af Amer: 78 mL/min — ABNORMAL LOW (ref >90)
GFR, EST NON AFRICAN AMERICAN: 67 mL/min — AB (ref >90)
GLUCOSE: 166 mg/dL — AB (ref 70–99)
POTASSIUM: 3.3 meq/L — AB (ref 3.7–5.3)
Sodium: 144 mEq/L (ref 137–147)

## 2013-10-13 MED ORDER — FUROSEMIDE 10 MG/ML IJ SOLN
40.0000 mg | Freq: Two times a day (BID) | INTRAMUSCULAR | Status: DC
  Administered 2013-10-13 – 2013-10-14 (×3): 40 mg via INTRAVENOUS
  Filled 2013-10-13 (×5): qty 4

## 2013-10-13 MED ORDER — INSULIN ASPART 100 UNIT/ML ~~LOC~~ SOLN
10.0000 [IU] | Freq: Three times a day (TID) | SUBCUTANEOUS | Status: DC
  Administered 2013-10-13 – 2013-10-14 (×2): 10 [IU] via SUBCUTANEOUS

## 2013-10-13 NOTE — Progress Notes (Signed)
Late entry 1030 - Dressing changed by MHawkins, RN & ARogers, RN per orders

## 2013-10-13 NOTE — Progress Notes (Signed)
NAMEMILEY, GYGI                 ACCOUNT NO.:  [id number]  MEDICAL RECORD NO.:  [id number]  LOCATION:  A303                          FACILITY:  [facility]  PHYSICIAN:  Iline Buchinger L. Luan Pulling, M.D.DATE OF BIRTH:  1966/02/03  DATE OF PROCEDURE: DATE OF DISCHARGE:                                PROGRESS NOTE   SUBJECTIVE:  [name] was admitted to the hospital with uncontrolled diabetes, which is thought to be mostly related to her breast abscess. She had treatment by Dr. Romona Curls, and says that her breast is doing better.  She does not feel as well as she did.  She has no other new complaints.  OBJECTIVE:  GENERAL:  She is awake and alert. VITAL SIGNS:  Temperature 97.5, pulse 79, respirations 14, blood pressure 122/63, O2 sat 92%. HEENT:  Her pupils are reactive.  Nose and throat are clear.  She is morbidly obese. CHEST:  Her breast scar surgery area is dry.  Her chest is relatively clear.  ASSESSMENT:  She may be able to go home later today.  We will see what happened after she gets her dressing change and how she feels.  She is going to let me know how she feels after she has the dressing change, etc.     Martha [name] L. Luan Pulling, M.D.     ELH/MEDQ  D:  10/12/2013  T:  10/13/2013  Job:  [id number]

## 2013-10-13 NOTE — Progress Notes (Signed)
Subjective: She says she feels better but is still somewhat short of breath. She had some trouble with low oxygen level again during the night even with her CPAP. She says she feels like she may need oxygen at home because she gets short of breath with minimal exertion. Chest x-ray yesterday was suggestive of volume overload and her IV fluids have been discontinued and she's back on Lasix echocardiogram has been ordered and is pending  Objective: Vital signs in last 24 hours: Temp:  [97.5 F (36.4 C)-98.5 F (36.9 C)] 97.5 F (36.4 C) (03/19 0550) Pulse Rate:  [103-117] 103 (03/19 0550) Resp:  [20] 20 (03/19 0550) BP: (153-162)/(87-94) 153/87 mmHg (03/19 0550) SpO2:  [89 %-96 %] 89 % (03/19 0654) FiO2 (%):  [21 %] 21 % (03/18 1035) Weight change:  Last BM Date: 10/11/13  Intake/Output from previous day: 03/18 0701 - 03/19 0700 In: 1882.8 [P.O.:720; I.V.:962.8; IV Piggyback:200] Out: -   PHYSICAL EXAM General appearance: alert, cooperative, moderate distress and morbidly obese Resp: rhonchi bilaterally Cardio: regular rate and rhythm, S1, S2 normal, no murmur, click, rub or gallop GI: soft, non-tender; bowel sounds normal; no masses,  no organomegaly Extremities: extremities normal, atraumatic, no cyanosis or edema  Lab Results:    Basic Metabolic Panel:  Recent Labs  10/11/13 0450  GLUCOSE 525*   Liver Function Tests: No results found for this basename: AST, ALT, ALKPHOS, BILITOT, PROT, ALBUMIN,  in the last 72 hours No results found for this basename: LIPASE, AMYLASE,  in the last 72 hours No results found for this basename: AMMONIA,  in the last 72 hours CBC: No results found for this basename: WBC, NEUTROABS, HGB, HCT, MCV, PLT,  in the last 72 hours Cardiac Enzymes: No results found for this basename: CKTOTAL, CKMB, CKMBINDEX, TROPONINI,  in the last 72 hours BNP: No results found for this basename: PROBNP,  in the last 72 hours D-Dimer: No results found for  this basename: DDIMER,  in the last 72 hours CBG:  Recent Labs  10/11/13 2126 10/12/13 0723 10/12/13 1105 10/12/13 1634 10/12/13 2142 10/13/13 0713  GLUCAP 354* 354* 182* 128* 148* 118*   Hemoglobin A1C: No results found for this basename: HGBA1C,  in the last 72 hours Fasting Lipid Panel: No results found for this basename: CHOL, HDL, LDLCALC, TRIG, CHOLHDL, LDLDIRECT,  in the last 72 hours Thyroid Function Tests: No results found for this basename: TSH, T4TOTAL, FREET4, T3FREE, THYROIDAB,  in the last 72 hours Anemia Panel: No results found for this basename: VITAMINB12, FOLATE, FERRITIN, TIBC, IRON, RETICCTPCT,  in the last 72 hours Coagulation: No results found for this basename: LABPROT, INR,  in the last 72 hours Urine Drug Screen: Drugs of Abuse  No results found for this basename: labopia, cocainscrnur, labbenz, amphetmu, thcu, labbarb    Alcohol Level: No results found for this basename: ETH,  in the last 72 hours Urinalysis: No results found for this basename: COLORURINE, APPERANCEUR, LABSPEC, PHURINE, GLUCOSEU, HGBUR, BILIRUBINUR, KETONESUR, PROTEINUR, UROBILINOGEN, NITRITE, LEUKOCYTESUR,  in the last 72 hours Misc. Labs:  ABGS No results found for this basename: PHART, PCO2, PO2ART, TCO2, HCO3,  in the last 72 hours CULTURES Recent Results (from the past 240 hour(s))  MRSA PCR SCREENING     Status: None   Collection Time    10/09/13  4:27 AM      Result Value Ref Range Status   MRSA by PCR NEGATIVE  NEGATIVE Final   Comment:  The GeneXpert MRSA Assay (FDA     approved for NASAL specimens     only), is one component of a     comprehensive MRSA colonization     surveillance program. It is not     intended to diagnose MRSA     infection nor to guide or     monitor treatment for     MRSA infections.  CULTURE, ROUTINE-ABSCESS     Status: None   Collection Time    10/11/13  8:02 AM      Result Value Ref Range Status   Specimen Description  ABSCESS LEFT BREAST   Final   Special Requests CIPRO 400mg    Final   Gram Stain     Final   Value: RARE WBC PRESENT, PREDOMINANTLY PMN     NO SQUAMOUS EPITHELIAL CELLS SEEN     RARE GRAM POSITIVE COCCI     IN PAIRS     Performed at [facility]   Culture     Final   Value: NO GROWTH 2 DAYS     Performed at [facility]   Report Status PENDING   Incomplete  ANAEROBIC CULTURE     Status: None   Collection Time    10/11/13  8:02 AM      Result Value Ref Range Status   Specimen Description ABSCESS LEFT BREAST   Final   Special Requests CIPRO 400mg    Final   Gram Stain     Final   Value: RARE WBC PRESENT, PREDOMINANTLY PMN     NO SQUAMOUS EPITHELIAL CELLS SEEN     RARE GRAM POSITIVE COCCI     IN PAIRS     Performed at [facility]   Culture     Final   Value: NO ANAEROBES ISOLATED; CULTURE IN PROGRESS FOR 5 DAYS     Performed at [facility]   Report Status PENDING   Incomplete   Studies/Results: Dg Chest 2 View  10/12/2013   CLINICAL DATA:  Wheezing.  EXAM: CHEST  2 VIEW  COMPARISON:  May 28, 2013.  FINDINGS: The heart size and mediastinal contours are within normal limits. Mild interstitial densities with Kerley B-lines are seen in both lung bases concerning for mild pulmonary edema. No pleural effusion or pneumothorax is noted. The visualized skeletal structures are unremarkable.  IMPRESSION: Findings consistent with mild bilateral basilar pulmonary edema.   Electronically Signed   By: Sabino Dick M.D.   On: 10/12/2013 12:05    Medications:  Prior to Admission:  Prescriptions prior to admission  Medication Sig Dispense Refill  . albuterol (PROAIR HFA) 108 (90 BASE) MCG/ACT inhaler Inhale 2 puffs into the lungs every 6 (six) hours as needed for wheezing or shortness of breath.      Marland Kitchen albuterol (PROVENTIL) (2.5 MG/3ML) 0.083% nebulizer solution Take 2.5 mg by nebulization 4 (four) times daily.       Marland Kitchen ALPRAZolam (XANAX) 1 MG tablet Take 1 mg by  mouth 4 (four) times daily.        Marland Kitchen amLODipine (NORVASC) 10 MG tablet Take 10 mg by mouth daily.      Marland Kitchen HYDROcodone-acetaminophen (NORCO) 10-325 MG per tablet Take 1 tablet by mouth every 6 (six) hours as needed for pain.      Marland Kitchen insulin aspart (NOVOLOG) 100 UNIT/ML injection Inject 10-20 Units into the skin 3 (three) times daily. Inject under skin 25 units before meals and hold if skips meal.  Do  not give at bedtime  Correction AC (G-120) 2520      . Insulin Glargine (LANTUS SOLOSTAR) 100 UNIT/ML SOPN Inject 80 Units into the skin. Inject under skin 80 units at bedtime (divide into 2 equal injections)      . lisinopril-hydrochlorothiazide (PRINZIDE,ZESTORETIC) 20-12.5 MG per tablet Take 1 tablet by mouth 2 (two) times daily.       . metFORMIN (GLUCOPHAGE) 1000 MG tablet Take 1,000 mg by mouth 2 (two) times daily with a meal.      . omeprazole (PRILOSEC) 20 MG capsule Take 20 mg by mouth 2 (two) times daily.       Marland Kitchen rOPINIRole (REQUIP) 1 MG tablet Take 1 mg by mouth at bedtime.       . simvastatin (ZOCOR) 40 MG tablet Take 40 mg by mouth every evening.      Marland Kitchen UNKNOWN TO PATIENT Apply 1 application topically daily as needed (for irritation (vaginal area) COMPOUND MEDICATION: Ketoconazole mixed with another medication  (name is unknown)).       Scheduled: . ALPRAZolam  1 mg Oral QID  . amLODipine  10 mg Oral Daily  . atorvastatin  20 mg Oral q1800  . ciprofloxacin  400 mg Intravenous Q12H  . enoxaparin (LOVENOX) injection  80 mg Subcutaneous Q24H  . furosemide  40 mg Intravenous Q12H  . insulin aspart  0-15 Units Subcutaneous TID WC  . insulin aspart  30 Units Subcutaneous TID WC  . insulin glargine  90 Units Subcutaneous QHS  . ipratropium-albuterol  3 mL Nebulization TID  . pantoprazole  40 mg Oral Daily  . predniSONE  5 mg Oral BID WC  . rOPINIRole  1 mg Oral QHS  . sodium chloride  3 mL Intravenous Q12H   Continuous: . sodium chloride 10 mL/hr at 10/12/13 1250   [id number],  acetaminophen, albuterol, HYDROcodone-acetaminophen, HYDROmorphone (DILAUDID) injection, ondansetron (ZOFRAN) IV, ondansetron  Assesment: She was admitted with uncontrolled diabetes and it's felt that probably related to a breast abscess. She is morbidly obese which is unchanged. She has sleep apnea and her oxygenation was not good despite CPAP. She has some asthma at baseline and has a smoking history so there may be an element of COPD. She may have some congestive heart failure Active Problems:   Hyperglycemia   Uncontrolled diabetes mellitus    Plan: Continue Lasix and increase it to twice a day. Check echocardiogram. She may need oxygen at home    LOS: 4 days   Xzayvier Fagin L 10/13/2013, 8:57 AM

## 2013-10-13 NOTE — Progress Notes (Signed)
POD #2  Filed Vitals:   10/13/13 0550  BP: 153/87  Pulse: 103  Temp: 97.5 F (36.4 C)  Resp: 20  Wound clean and dry.  No clinical sign of residual infection.  Discharge as per med. Service.  Con't same dressing change regimen while in hospital.

## 2013-10-13 NOTE — Progress Notes (Signed)
*  PRELIMINARY RESULTS* Echocardiogram 2D Echocardiogram has been performed.  Joice, [place] 10/13/2013, 9:50 AM

## 2013-10-13 NOTE — Care Management Note (Addendum)
    Page 1 of 2   10/14/2013     10:48:18 AM   CARE MANAGEMENT NOTE 10/14/2013  Patient:  Martha [name], Martha [name]   Account Number:  [id number]  Date Initiated:  10/13/2013  Documentation initiated by:  Theophilus Kinds  Subjective/Objective Assessment:   Pt admitted from home with breast abcess and hyperglycemia. Pt lives with her son and will return home at discharge. Pt is independent with ALD's.     Action/Plan:   Pts son will do dressing changes for pt. Pt may need home O2 at discharge. Will continue to follow for discharge planning needs.   Anticipated DC Date:  10/16/2013   Anticipated DC Plan:  HOME/SELF CARE      DC Planning Services  CM consult      PAC Choice  DURABLE MEDICAL EQUIPMENT   Choice offered to / List presented to:  C-1 Patient   DME arranged  [facility]      DME agency  [facility]        Status of service:  Completed, signed off Medicare Important Message given?   (If response is "NO", the following Medicare IM given date fields will be blank) Date Medicare IM given:   Date Additional Medicare IM given:    Discharge Disposition:  HOME/SELF CARE  Per UR Regulation:    If discussed at Long Length of Stay Meetings, dates discussed:    Comments:  10/14/13 [place], RN BSN CM Pt discharged home today with home O2 with [employer] (per pts choice). Pt will have portable delivered to pts room prior to discharge. Concentrator will be delivered to pts home after discharge. Pt and pts nurse aware of discharge arrangements.  10/13/13 [place], RN BSN CM

## 2013-10-13 NOTE — Progress Notes (Signed)
This morning while getting vital signs the NT let me know that the patients O2 sats were in the 80's. I removed the patients cpap machine and put her on 2L of O2 via nasal canula. Her O2 came up to 96. I called RT and notified them that I put the patient on oxygen to bring her sats up. I continued to monitor the patient.

## 2013-10-14 LAB — CULTURE, ROUTINE-ABSCESS

## 2013-10-14 LAB — BASIC METABOLIC PANEL
BUN: 10 mg/dL (ref 6–23)
CHLORIDE: 102 meq/L (ref 96–112)
CO2: 33 mEq/L — ABNORMAL HIGH (ref 19–32)
CREATININE: 1.15 mg/dL — AB (ref 0.50–1.10)
Calcium: 9 mg/dL (ref 8.4–10.5)
GFR calc Af Amer: 66 mL/min — ABNORMAL LOW (ref >90)
GFR calc non Af Amer: 57 mL/min — ABNORMAL LOW (ref >90)
Glucose, Bld: 220 mg/dL — ABNORMAL HIGH (ref 70–99)
POTASSIUM: 3.2 meq/L — AB (ref 3.7–5.3)
Sodium: 144 mEq/L (ref 137–147)

## 2013-10-14 LAB — GLUCOSE, CAPILLARY
GLUCOSE-CAPILLARY: 199 mg/dL — AB (ref 70–99)
GLUCOSE-CAPILLARY: 154 mg/dL — AB (ref 70–99)
Glucose-Capillary: 237 mg/dL — ABNORMAL HIGH (ref 70–99)

## 2013-10-14 MED ORDER — FUROSEMIDE 40 MG PO TABS: 40.0000 mg | ORAL_TABLET | Freq: Every day | ORAL | Status: DC

## 2013-10-14 MED ORDER — POTASSIUM CHLORIDE ER 20 MEQ PO TBCR
8.0000 meq | EXTENDED_RELEASE_TABLET | Freq: Every day | ORAL | Status: DC
Start: 2013-10-14 — End: 2013-11-02

## 2013-10-14 NOTE — Clinical Documentation Improvement (Signed)
Please clarify documentation in the medical record of "debridement."  Please document the below (4) key elements in a progress note:   Type of instrument used?   Depth of debridement/type of tissue removed? (e.g., skin, subcutaneous tissue, fascia, muscle, bone, other)   Did the debridement extend outside or beyond the wound margins?   Excisional vs. nonexcisional?   Irrigation of wound (e.g, Versajet)?   Location of wound?   Document the size of the debridement.  Risk Factors: S/P PROCEDURE: Procedure(s): INCISION AND DRAINAGE DEBRIDEMENT LEFT BREAST ABSCESS (Left)     Diagnostics: "GROSS OPERATIVE FINDINGS: The patient had an area of induration and bloody like discharge from her left breast. This was cultured and packed open."  Treatment "An elliptical incision was carried out over the area of infected breast, this was removed and sent as a specimen"   Thank You, Estella Husk ,RN Clinical Documentation Specialist:  [facility]

## 2013-10-14 NOTE — Progress Notes (Addendum)
O2 sats at Rest on Room Air: 86%

## 2013-10-14 NOTE — Progress Notes (Signed)
Patients BP was elevated this morning. Gave morning BP medicine early. Will pass on in report to day shift nurse, and continue to monitor the patient.

## 2013-10-14 NOTE — Progress Notes (Signed)
Nutrition Brief Note  RD pulled to chart due to LOS  Wt Readings from Last 15 Encounters:  10/11/13 367 lb 1.1 oz (166.5 kg)  10/11/13 367 lb 1.1 oz (166.5 kg)  06/02/13 373 lb 4.8 oz (169.328 kg)  06/02/13 373 lb 4.8 oz (169.328 kg)  05/28/13 358 lb (162.388 kg)  05/17/13 368 lb (166.924 kg)  05/17/13 368 lb (166.924 kg)  05/13/13 367 lb (166.47 kg)  04/28/13 366 lb 6.4 oz (166.198 kg)  04/15/13 358 lb 3.2 oz (162.478 kg)  03/14/13 372 lb (168.738 kg)  02/20/13 356 lb (161.481 kg)  02/11/13 375 lb (170.099 kg)  08/30/12 400 lb (181.439 kg)  02/08/12 376 lb 8 oz (170.779 kg)    Body mass index is 55.83 kg/(m^2). Patient meets criteria for extreme obesity, class III based on current BMI.   Current diet order is Carb Modified, patient is consuming approximately 100% of meals at this time. Labs and medications reviewed.   No nutrition interventions warranted at this time. If nutrition issues arise, please consult RD.   Joshlynn Alfonzo A. Jimmye Norman, RD, LDN Pager: [phone]

## 2013-10-14 NOTE — Progress Notes (Signed)
Patient being d/c home with Oxygen. IV cath removed and intact. No pain/swelling at site. Patient verbalizes understanding of instructions.

## 2013-10-16 LAB — ANAEROBIC CULTURE

## 2013-10-17 LAB — GLUCOSE, CAPILLARY: Glucose-Capillary: 172 mg/dL — ABNORMAL HIGH (ref 70–99)

## 2013-10-17 NOTE — Discharge Summary (Signed)
Physician Discharge Summary  Patient ID: Martha [name] MRN: [id number] DOB/AGE: 1965/09/15 48 y.o. Primary Care Physician:Ac Colan L, MD Admit date: 10/09/2013 Discharge date: 10/17/2013    Discharge Diagnoses:   Active Problems:   Hyperglycemia   Uncontrolled diabetes mellitus  morbid obesity Hypertension Breast abscess Anxiety Chronic low back pain Acute on chronic diastolic heart failure Left ventricular hypertrophy    Medication List         ALPRAZolam 1 MG tablet  Commonly known as:  XANAX  Take 1 mg by mouth 4 (four) times daily.     amLODipine 10 MG tablet  Commonly known as:  NORVASC  Take 10 mg by mouth daily.     doxycycline 50 MG capsule  Commonly known as:  VIBRAMYCIN  Take 2 capsules (100 mg total) by mouth 2 (two) times daily.     furosemide 40 MG tablet  Commonly known as:  LASIX  Take 1 tablet (40 mg total) by mouth daily.     HYDROcodone-acetaminophen 10-325 MG per tablet  Commonly known as:  NORCO  Take 1 tablet by mouth every 6 (six) hours as needed for pain.     insulin aspart 100 UNIT/ML injection  Commonly known as:  novoLOG  Inject 10-20 Units into the skin 3 (three) times daily. Inject under skin 25 units before meals and hold if skips meal.  Do not give at bedtime  Correction AC (G-120) 2520     LANTUS SOLOSTAR 100 UNIT/ML Solostar Pen  Generic drug:  Insulin Glargine  Inject 80 Units into the skin. Inject under skin 80 units at bedtime (divide into 2 equal injections)     lisinopril-hydrochlorothiazide 20-12.5 MG per tablet  Commonly known as:  PRINZIDE,ZESTORETIC  Take 1 tablet by mouth 2 (two) times daily.     metFORMIN 1000 MG tablet  Commonly known as:  GLUCOPHAGE  Take 1,000 mg by mouth 2 (two) times daily with a meal.     omeprazole 20 MG capsule  Commonly known as:  PRILOSEC  Take 20 mg by mouth 2 (two) times daily.     Potassium Chloride ER 20 MEQ Tbcr  Take 8 mEq by mouth daily.     PROAIR HFA 108 (90 BASE)  MCG/ACT inhaler  Generic drug:  albuterol  Inhale 2 puffs into the lungs every 6 (six) hours as needed for wheezing or shortness of breath.     albuterol (2.5 MG/3ML) 0.083% nebulizer solution  Commonly known as:  PROVENTIL  Take 2.5 mg by nebulization 4 (four) times daily.     rOPINIRole 1 MG tablet  Commonly known as:  REQUIP  Take 1 mg by mouth at bedtime.     simvastatin 40 MG tablet  Commonly known as:  ZOCOR  Take 40 mg by mouth every evening.     UNKNOWN TO PATIENT  Apply 1 application topically daily as needed (for irritation (vaginal area) COMPOUND MEDICATION: Ketoconazole mixed with another medication  (name is unknown)).        Discharged Condition: Improved    Consults: General surgery  Significant Diagnostic Studies: Dg Chest 2 View  10/12/2013   CLINICAL DATA:  Wheezing.  EXAM: CHEST  2 VIEW  COMPARISON:  May 28, 2013.  FINDINGS: The heart size and mediastinal contours are within normal limits. Mild interstitial densities with Kerley B-lines are seen in both lung bases concerning for mild pulmonary edema. No pleural effusion or pneumothorax is noted. The visualized skeletal structures are unremarkable.  IMPRESSION: Findings consistent  with mild bilateral basilar pulmonary edema.   Electronically Signed   By: Sabino Dick M.D.   On: 10/12/2013 12:05    Lab Results: Basic Metabolic Panel: No results found for this basename: NA, K, CL, CO2, GLUCOSE, BUN, CREATININE, CALCIUM, MG, PHOS,  in the last 72 hours Liver Function Tests: No results found for this basename: AST, ALT, ALKPHOS, BILITOT, PROT, ALBUMIN,  in the last 72 hours   CBC: No results found for this basename: WBC, NEUTROABS, HGB, HCT, MCV, PLT,  in the last 72 hours  Recent Results (from the past 240 hour(s))  MRSA PCR SCREENING     Status: None   Collection Time    10/09/13  4:27 AM      Result Value Ref Range Status   MRSA by PCR NEGATIVE  NEGATIVE Final   Comment:            The GeneXpert  MRSA Assay (FDA     approved for NASAL specimens     only), is one component of a     comprehensive MRSA colonization     surveillance program. It is not     intended to diagnose MRSA     infection nor to guide or     monitor treatment for     MRSA infections.  CULTURE, ROUTINE-ABSCESS     Status: None   Collection Time    10/11/13  8:02 AM      Result Value Ref Range Status   Specimen Description ABSCESS LEFT BREAST   Final   Special Requests CIPRO 400mg    Final   Gram Stain     Final   Value: RARE WBC PRESENT, PREDOMINANTLY PMN     NO SQUAMOUS EPITHELIAL CELLS SEEN     RARE GRAM POSITIVE COCCI     IN PAIRS     Performed at [facility]   Culture     Final   Value: MULTIPLE ORGANISMS PRESENT, NONE PREDOMINANT     Note: NO STAPHYLOCOCCUS AUREUS ISOLATED NO GROUP A STREP (S.PYOGENES) ISOLATED     Performed at [facility]   Report Status 10/14/2013 FINAL   Final  ANAEROBIC CULTURE     Status: None   Collection Time    10/11/13  8:02 AM      Result Value Ref Range Status   Specimen Description ABSCESS LEFT BREAST   Final   Special Requests CIPRO 400mg    Final   Gram Stain     Final   Value: RARE WBC PRESENT, PREDOMINANTLY PMN     NO SQUAMOUS EPITHELIAL CELLS SEEN     RARE GRAM POSITIVE COCCI     IN PAIRS     Performed at [facility]   Culture     Final   Value: NO ANAEROBES ISOLATED     Performed at [facility]   Report Status 10/16/2013 FINAL   Final     Hospital Course: She was admitted with uncontrolled blood sugar. She had not been taking her insulin at home and actually just got her insulin about one day prior to admission. Her blood sugars in the 4 and 500 range. She feels this is related to a breast abscess. She was noted to have an abscess underneath her left breast. It has some surrounding erythema. She was started on insulin continuous infusion then switch to nasal insulin and sliding scale. She had consultation with general  surgery and underwent incision and drainage of the  abscess. Postoperatively she had what appeared to be acute on chronic diastolic congestive heart failure which was treated with Lasix. Echocardiogram showed left ventricular hypertrophy and diastolic dysfunction. This improved with treatment. Her blood sugars were better. She was back to baseline by the time of  Discharge Exam: Blood pressure 156/105, pulse 107, temperature 97.4 F (36.3 C), temperature source Oral, resp. rate 20, height 5\' 8"  (1.727 m), weight 166.5 kg (367 lb 1.1 oz), SpO2 86.00%. She is morbidly obese. Her chest is clear. Her heart is regular. Her abdomen soft. The breast abscess looks better  Disposition: Home she will be on home oxygen      Discharge Orders   Future Orders Complete By Expires   For home use only DME oxygen  As directed    Questions:     Mode or (Route):  Nasal cannula   Liters per Minute:  2   Frequency:  Continuous (stationary and portable oxygen unit needed)   Oxygen conserving device:  No        Signed: [place] Agent L   10/17/2013, 9:02 AM

## 2013-10-18 NOTE — Progress Notes (Signed)
UR chart review completed.  

## 2013-10-19 NOTE — Progress Notes (Signed)
EGD OCT 2014 [facility] GASTRIC/ESO BX  FSIG OCT 2014 [facility] BANDINGx2

## 2013-11-02 ENCOUNTER — Encounter (HOSPITAL_COMMUNITY): Payer: Self-pay | Admitting: Emergency Medicine

## 2013-11-02 ENCOUNTER — Emergency Department (HOSPITAL_COMMUNITY): Payer: Medicaid Other

## 2013-11-02 ENCOUNTER — Emergency Department (HOSPITAL_COMMUNITY)
Admission: EM | Admit: 2013-11-02 | Discharge: 2013-11-02 | Disposition: A | Discharge status: Home / Self Care | Payer: Medicaid Other | Attending: Emergency Medicine | Admitting: Emergency Medicine

## 2013-11-02 DIAGNOSIS — F329 Major depressive disorder, single episode, unspecified: Secondary | ICD-10-CM | POA: Insufficient documentation

## 2013-11-02 DIAGNOSIS — J449 Chronic obstructive pulmonary disease, unspecified: Secondary | ICD-10-CM | POA: Insufficient documentation

## 2013-11-02 DIAGNOSIS — M79609 Pain in unspecified limb: Secondary | ICD-10-CM | POA: Insufficient documentation

## 2013-11-02 DIAGNOSIS — I10 Essential (primary) hypertension: Secondary | ICD-10-CM | POA: Insufficient documentation

## 2013-11-02 DIAGNOSIS — M129 Arthropathy, unspecified: Secondary | ICD-10-CM | POA: Insufficient documentation

## 2013-11-02 DIAGNOSIS — Z9981 Dependence on supplemental oxygen: Secondary | ICD-10-CM | POA: Insufficient documentation

## 2013-11-02 DIAGNOSIS — M25449 Effusion, unspecified hand: Secondary | ICD-10-CM | POA: Insufficient documentation

## 2013-11-02 DIAGNOSIS — Z794 Long term (current) use of insulin: Secondary | ICD-10-CM | POA: Insufficient documentation

## 2013-11-02 DIAGNOSIS — R739 Hyperglycemia, unspecified: Secondary | ICD-10-CM

## 2013-11-02 DIAGNOSIS — K219 Gastro-esophageal reflux disease without esophagitis: Secondary | ICD-10-CM | POA: Insufficient documentation

## 2013-11-02 DIAGNOSIS — E119 Type 2 diabetes mellitus without complications: Secondary | ICD-10-CM | POA: Insufficient documentation

## 2013-11-02 DIAGNOSIS — G4733 Obstructive sleep apnea (adult) (pediatric): Secondary | ICD-10-CM | POA: Insufficient documentation

## 2013-11-02 DIAGNOSIS — F3289 Other specified depressive episodes: Secondary | ICD-10-CM | POA: Insufficient documentation

## 2013-11-02 DIAGNOSIS — Z79899 Other long term (current) drug therapy: Secondary | ICD-10-CM | POA: Insufficient documentation

## 2013-11-02 DIAGNOSIS — Z87891 Personal history of nicotine dependence: Secondary | ICD-10-CM | POA: Insufficient documentation

## 2013-11-02 DIAGNOSIS — J4489 Other specified chronic obstructive pulmonary disease: Secondary | ICD-10-CM | POA: Insufficient documentation

## 2013-11-02 DIAGNOSIS — G2581 Restless legs syndrome: Secondary | ICD-10-CM | POA: Insufficient documentation

## 2013-11-02 DIAGNOSIS — Z862 Personal history of diseases of the blood and blood-forming organs and certain disorders involving the immune mechanism: Secondary | ICD-10-CM | POA: Insufficient documentation

## 2013-11-02 DIAGNOSIS — Z792 Long term (current) use of antibiotics: Secondary | ICD-10-CM | POA: Insufficient documentation

## 2013-11-02 LAB — CBC WITH DIFFERENTIAL/PLATELET
BASOS ABS: 0 10*3/uL (ref 0.0–0.1)
Basophils Relative: 1 % (ref 0–1)
Eosinophils Absolute: 0.1 10*3/uL (ref 0.0–0.7)
Eosinophils Relative: 2 % (ref 0–5)
HEMATOCRIT: 33.5 % — AB (ref 36.0–46.0)
Hemoglobin: 11.2 g/dL — ABNORMAL LOW (ref 12.0–15.0)
LYMPHS PCT: 40 % (ref 12–46)
Lymphs Abs: 1.9 10*3/uL (ref 0.7–4.0)
MCH: 28.4 pg (ref 26.0–34.0)
MCHC: 33.4 g/dL (ref 30.0–36.0)
MCV: 84.8 fL (ref 78.0–100.0)
MONO ABS: 0.3 10*3/uL (ref 0.1–1.0)
Monocytes Relative: 6 % (ref 3–12)
NEUTROS ABS: 2.5 10*3/uL (ref 1.7–7.7)
NEUTROS PCT: 51 % (ref 43–77)
PLATELETS: 213 10*3/uL (ref 150–400)
RBC: 3.95 MIL/uL (ref 3.87–5.11)
RDW: 14 % (ref 11.5–15.5)
WBC: 4.8 10*3/uL (ref 4.0–10.5)

## 2013-11-02 LAB — CBG MONITORING, ED
Glucose-Capillary: 529 mg/dL — ABNORMAL HIGH (ref 70–99)
Glucose-Capillary: 484 mg/dL — ABNORMAL HIGH (ref 70–99)
Glucose-Capillary: 398 mg/dL — ABNORMAL HIGH (ref 70–99)

## 2013-11-02 LAB — COMPREHENSIVE METABOLIC PANEL
ALBUMIN: 2.3 g/dL — AB (ref 3.5–5.2)
ALT: 17 U/L (ref 0–35)
AST: 11 U/L (ref 0–37)
Alkaline Phosphatase: 93 U/L (ref 39–117)
BUN: 16 mg/dL (ref 6–23)
CHLORIDE: 97 meq/L (ref 96–112)
CO2: 23 mEq/L (ref 19–32)
CREATININE: 1.39 mg/dL — AB (ref 0.50–1.10)
Calcium: 8.7 mg/dL (ref 8.4–10.5)
GFR calc Af Amer: 52 mL/min — ABNORMAL LOW (ref >90)
GFR, EST NON AFRICAN AMERICAN: 45 mL/min — AB (ref >90)
Glucose, Bld: 619 mg/dL (ref 70–99)
Potassium: 4.1 mEq/L (ref 3.7–5.3)
SODIUM: 136 meq/L — AB (ref 137–147)
Total Protein: 6 g/dL (ref 6.0–8.3)

## 2013-11-02 MED ORDER — SODIUM CHLORIDE 0.9 % IV BOLUS (SEPSIS)
1000.0000 mL | Freq: Once | INTRAVENOUS | Status: AC
  Administered 2013-11-02: 1000 mL via INTRAVENOUS

## 2013-11-02 MED ORDER — INSULIN ASPART 100 UNIT/ML ~~LOC~~ SOLN
15.0000 [IU] | Freq: Once | SUBCUTANEOUS | Status: AC
  Administered 2013-11-02: 15 [IU] via SUBCUTANEOUS
  Filled 2013-11-02: qty 1

## 2013-11-02 NOTE — ED Notes (Addendum)
Pain to left hand. Unable to hold anything in that hand without dropping it. Pain to first three digits of left hand. CBG of 568

## 2013-11-02 NOTE — Discharge Instructions (Signed)
Increase your insulin to 55 units twice a day and follow up with dr. Luan Pulling next week.

## 2013-11-02 NOTE — ED Provider Notes (Signed)
CSN: [id number]     Arrival date & time 11/02/13  1756 History   First MD Initiated Contact with Patient 11/02/13 1843    This chart was scribed for [name], MD by Terressa Koyanagi, ED Scribe. This patient was seen in room APA19/APA19 and the patient's care was started at 6:46 PM.  PCP: Alonza Bogus, MD  Chief Complaint  Patient presents with  . Hand Pain  . Hyperglycemia   Patient is a 48 y.o. female presenting with hand pain and hyperglycemia. The history is provided by the patient. No language interpreter was used.  Hand Pain This is a new problem. The problem occurs constantly. The problem has not changed since onset.Pertinent negatives include no chest pain, no abdominal pain and no headaches. Nothing relieves the symptoms. Treatments tried: stress ball   Hyperglycemia Associated symptoms: no abdominal pain, no chest pain and no fatigue    HPI Comments: Martha [name] is a 48 y.o. female, with a history of DM, HTN, COPD and arthritis, who presents to the Emergency Department complaining of constant, worsening left hand pain with associated hyperglycemia. Pt describes her hand pain as though "lightinig [were] going through [her] fingers." Pt further states that she has difficulty holding anything in her left hand. Pt reports that she has been using stress balls to alleviate the pain without relief. Pt further reports that her sugar levels were within normal limits prior to arrival to the ED.   Pt was last treated at the ED for hyperglycemia on 10/09/13.   Past Medical History  Diagnosis Date  . Diabetes mellitus   . Hypertension   . RLS (restless legs syndrome)   . Acid reflux   . Bipolar 1 disorder   . Depression   . OSA (obstructive sleep apnea)     cpap  . Anemia   . COPD (chronic obstructive pulmonary disease)     bronchitis  . Arthritis    Past Surgical History  Procedure Laterality Date  . Tubal ligation    . Endometrial ablation    . Colonoscopy  06/06/2010    [id number] bleeding secondary to internal hemorrhoids but incomplete evaluation secondary to poor right colon prep/small rectal and sigmoid colon polyps (hyperplastic). PROPOFOL  . Esophagogastroduodenoscopy  06/06/2010    [id number] erythema and edema of body of stomach, with sessile polypoid lesions. bx benign. no h.pylori  . Colonoscopy  May 2013    Dr. Gilliam/[facility]: 5 mm a descending colon polyp, hyperplastic. Adequate bowel prep.  . Flexible sigmoidoscopy N/A 05/17/2013    Procedure: FLEXIBLE SIGMOIDOSCOPY WITH PROPOFOL;  Surgeon: [name], MD;  Location: [facility];  Service: Endoscopy;  Laterality: N/A;  . Esophagogastroduodenoscopy (egd) with propofol N/A 05/17/2013    Procedure: ESOPHAGOGASTRODUODENOSCOPY (EGD) WITH PROPOFOL;  Surgeon: [name], MD;  Location: [facility];  Service: Endoscopy;  Laterality: N/A;  g junction is 42 cm  . Savory dilation N/A 05/17/2013    Procedure: SAVORY DILATION;  Surgeon: [name], MD;  Location: [facility];  Service: Endoscopy;  Laterality: N/A;  14/15/16  . Hemorrhoid banding N/A 05/17/2013    Procedure: HEMORRHOID BANDING;  Surgeon: [name], MD;  Location: [facility];  Service: Endoscopy;  Laterality: N/A;  2 bands placed  . Esophageal biopsy N/A 05/17/2013    Procedure: BIOPSY;  Surgeon: [name], MD;  Location: [facility];  Service: Endoscopy;  Laterality: N/A;  . Irrigation and debridement abscess Right 06/01/2013    Procedure: INCISION AND  DRAINAGE AND DEBRIDEMENT ABSCESS RIGHT BREAST;  Surgeon: [name], MD;  Location: [facility];  Service: General;  Laterality: Right;  . Incision and drainage abscess Left 10/11/2013    Procedure: INCISION AND DRAINAGE AND DEBRIDEMENT LEFT BREAST  ABSCESS;  Surgeon: [name], MD;  Location: [facility];  Service: General;  Laterality: Left;   Family History  Problem Relation Age of Onset  . Adopted: Yes   History  Substance Use Topics  . Smoking status: Former Smoker -- 1.50 packs/day  for 25 years    Types: Cigarettes    Quit date: 10/23/2007  . Smokeless tobacco: Former Systems developer  . Alcohol Use: No   OB History   Grav Para Term Preterm Abortions TAB SAB Ect Mult Living   2 2 1 1      2      Review of Systems  Constitutional: Negative for appetite change and fatigue.  HENT: Negative for congestion, ear discharge and sinus pressure.   Eyes: Negative for discharge.  Respiratory: Negative for cough.   Cardiovascular: Negative for chest pain.  Gastrointestinal: Negative for abdominal pain and diarrhea.  Genitourinary: Negative for frequency and hematuria.  Musculoskeletal: Positive for joint swelling and myalgias (left hand pain). Negative for back pain.  Skin: Negative for rash.  Neurological: Negative for seizures and headaches.  Psychiatric/Behavioral: Negative for hallucinations.      Allergies  Codeine  Home Medications   Current Outpatient Rx  Name  Route  Sig  Dispense  Refill  . albuterol (PROAIR HFA) 108 (90 BASE) MCG/ACT inhaler   Inhalation   Inhale 2 puffs into the lungs every 6 (six) hours as needed for wheezing or shortness of breath.         Marland Kitchen albuterol (PROVENTIL) (2.5 MG/3ML) 0.083% nebulizer solution   Nebulization   Take 2.5 mg by nebulization 4 (four) times daily.          Marland Kitchen ALPRAZolam (XANAX) 1 MG tablet   Oral   Take 1 mg by mouth 4 (four) times daily.           Marland Kitchen amLODipine (NORVASC) 10 MG tablet   Oral   Take 10 mg by mouth daily.         Marland Kitchen doxycycline (VIBRAMYCIN) 50 MG capsule   Oral   Take 2 capsules (100 mg total) by mouth 2 (two) times daily.   40 capsule   1   . furosemide (LASIX) 40 MG tablet   Oral   Take 1 tablet (40 mg total) by mouth daily.   30 tablet   2   . HYDROcodone-acetaminophen (NORCO) 10-325 MG per tablet   Oral   Take 1 tablet by mouth every 6 (six) hours as needed for pain.         Marland Kitchen insulin aspart (NOVOLOG) 100 UNIT/ML injection   Subcutaneous   Inject 10-20 Units into the skin 3  (three) times daily. Inject under skin 25 units before meals and hold if skips meal.  Do not give at bedtime  Correction AC (G-120) 2520         . Insulin Glargine (LANTUS SOLOSTAR) 100 UNIT/ML SOPN   Subcutaneous   Inject 80 Units into the skin. Inject under skin 80 units at bedtime (divide into 2 equal injections)         . lisinopril-hydrochlorothiazide (PRINZIDE,ZESTORETIC) 20-12.5 MG per tablet   Oral   Take 1 tablet by mouth 2 (two) times daily.          Marland Kitchen  metFORMIN (GLUCOPHAGE) 1000 MG tablet   Oral   Take 1,000 mg by mouth 2 (two) times daily with a meal.         . omeprazole (PRILOSEC) 20 MG capsule   Oral   Take 20 mg by mouth 2 (two) times daily.          . potassium chloride 20 MEQ TBCR   Oral   Take 8 mEq by mouth daily.   30 tablet   5   . rOPINIRole (REQUIP) 1 MG tablet   Oral   Take 1 mg by mouth at bedtime.          . simvastatin (ZOCOR) 40 MG tablet   Oral   Take 40 mg by mouth every evening.         Marland Kitchen UNKNOWN TO PATIENT   Topical   Apply 1 application topically daily as needed (for irritation (vaginal area) COMPOUND MEDICATION: Ketoconazole mixed with another medication  (name is unknown)).          Triage Vitals: BP 134/70  Pulse 107  Temp(Src) 98.3 F (36.8 C) (Oral)  Resp 20  Ht 5\' 8"  (1.727 m)  Wt 345 lb (156.491 kg)  BMI 52.47 kg/m2  SpO2 99% Physical Exam  Constitutional: She is oriented to person, place, and time. She appears well-developed.  HENT:  Head: Normocephalic.  Eyes: Conjunctivae and EOM are normal. No scleral icterus.  Neck: Neck supple. No thyromegaly present.  Cardiovascular: Normal rate and regular rhythm.  Exam reveals no gallop and no friction rub.   No murmur heard. Pulmonary/Chest: No stridor. She has no wheezes. She has no rales. She exhibits no tenderness.  Abdominal: She exhibits no distension. There is no tenderness. There is no rebound.  Musculoskeletal: Normal range of motion. She exhibits no  edema.  Mild swelling of the MCT joint in left hand   Lymphadenopathy:    She has no cervical adenopathy.  Neurological: She is oriented to person, place, and time. She exhibits normal muscle tone. Coordination normal.  Skin: No rash noted. No erythema.  Psychiatric: She has a normal mood and affect. Her behavior is normal.    ED Course  Procedures (including critical care time) DIAGNOSTIC STUDIES: Oxygen Saturation is 99% on RA, normal by my interpretation.    COORDINATION OF CARE:  6:49 PM-Discussed treatment plan which includes meds, imaging and labs, including CBG monitoring, with pt at bedside and pt agreed to plan.   Labs Review Labs Reviewed - No data to display Imaging Review No results found.   EKG Interpretation None      MDM   Final diagnoses:  None    Hyperglycemia The chart was scribed for me under my direct supervision.  I personally performed the history, physical, and medical decision making and all procedures in the evaluation of this patient.[name], MD 11/02/13 [phone]

## 2013-11-02 NOTE — ED Notes (Signed)
CRITICAL VALUE ALERT  Critical value received:  Glucose 619  Date of notification:  11/02/13  Time of notification:  2106  Critical value read back:yes  Nurse who received alert:  Matilde Haymaker, RN  MD notified (1st page):  Dr. Roderic Palau  Time of first page:  2107

## 2013-11-03 LAB — CBG MONITORING, ED: GLUCOSE-CAPILLARY: 568 mg/dL — AB (ref 70–99)

## 2013-12-20 ENCOUNTER — Other Ambulatory Visit (HOSPITAL_COMMUNITY): Payer: Self-pay | Admitting: Pulmonary Disease

## 2013-12-20 ENCOUNTER — Ambulatory Visit (HOSPITAL_COMMUNITY)
Admission: RE | Admit: 2013-12-20 | Discharge: 2013-12-20 | Disposition: A | Discharge status: Home / Self Care | Payer: Medicaid Other | Source: Ambulatory Visit | Attending: Pulmonary Disease | Admitting: Pulmonary Disease

## 2013-12-20 DIAGNOSIS — M549 Dorsalgia, unspecified: Secondary | ICD-10-CM

## 2013-12-20 DIAGNOSIS — IMO0002 Reserved for concepts with insufficient information to code with codable children: Secondary | ICD-10-CM | POA: Insufficient documentation

## 2013-12-27 ENCOUNTER — Other Ambulatory Visit (HOSPITAL_COMMUNITY): Payer: Self-pay | Admitting: Pulmonary Disease

## 2013-12-27 DIAGNOSIS — M549 Dorsalgia, unspecified: Secondary | ICD-10-CM

## 2014-01-02 ENCOUNTER — Ambulatory Visit (HOSPITAL_COMMUNITY): Admission: RE | Admit: 2014-01-02 | Payer: Medicaid Other | Source: Ambulatory Visit

## 2014-01-10 ENCOUNTER — Ambulatory Visit (HOSPITAL_COMMUNITY)
Admission: RE | Admit: 2014-01-10 | Discharge: 2014-01-10 | Disposition: A | Discharge status: Home / Self Care | Payer: Medicaid Other | Source: Ambulatory Visit | Attending: Pulmonary Disease | Admitting: Pulmonary Disease

## 2014-01-10 DIAGNOSIS — M549 Dorsalgia, unspecified: Secondary | ICD-10-CM

## 2014-01-18 ENCOUNTER — Emergency Department (HOSPITAL_COMMUNITY)
Admission: EM | Admit: 2014-01-18 | Discharge: 2014-01-18 | Disposition: A | Discharge status: Home / Self Care | Payer: Medicaid Other | Attending: Emergency Medicine | Admitting: Emergency Medicine

## 2014-01-18 ENCOUNTER — Other Ambulatory Visit: Payer: Self-pay | Admitting: Pulmonary Disease

## 2014-01-18 ENCOUNTER — Encounter (HOSPITAL_COMMUNITY): Payer: Self-pay | Admitting: Emergency Medicine

## 2014-01-18 DIAGNOSIS — J4489 Other specified chronic obstructive pulmonary disease: Secondary | ICD-10-CM | POA: Insufficient documentation

## 2014-01-18 DIAGNOSIS — Z87891 Personal history of nicotine dependence: Secondary | ICD-10-CM | POA: Insufficient documentation

## 2014-01-18 DIAGNOSIS — E669 Obesity, unspecified: Secondary | ICD-10-CM | POA: Insufficient documentation

## 2014-01-18 DIAGNOSIS — R109 Unspecified abdominal pain: Secondary | ICD-10-CM | POA: Insufficient documentation

## 2014-01-18 DIAGNOSIS — IMO0002 Reserved for concepts with insufficient information to code with codable children: Secondary | ICD-10-CM | POA: Insufficient documentation

## 2014-01-18 DIAGNOSIS — G2581 Restless legs syndrome: Secondary | ICD-10-CM | POA: Insufficient documentation

## 2014-01-18 DIAGNOSIS — Z9981 Dependence on supplemental oxygen: Secondary | ICD-10-CM | POA: Insufficient documentation

## 2014-01-18 DIAGNOSIS — R110 Nausea: Secondary | ICD-10-CM | POA: Insufficient documentation

## 2014-01-18 DIAGNOSIS — Z862 Personal history of diseases of the blood and blood-forming organs and certain disorders involving the immune mechanism: Secondary | ICD-10-CM | POA: Insufficient documentation

## 2014-01-18 DIAGNOSIS — M549 Dorsalgia, unspecified: Secondary | ICD-10-CM

## 2014-01-18 DIAGNOSIS — I10 Essential (primary) hypertension: Secondary | ICD-10-CM | POA: Insufficient documentation

## 2014-01-18 DIAGNOSIS — J449 Chronic obstructive pulmonary disease, unspecified: Secondary | ICD-10-CM | POA: Insufficient documentation

## 2014-01-18 DIAGNOSIS — M129 Arthropathy, unspecified: Secondary | ICD-10-CM | POA: Insufficient documentation

## 2014-01-18 DIAGNOSIS — K219 Gastro-esophageal reflux disease without esophagitis: Secondary | ICD-10-CM | POA: Insufficient documentation

## 2014-01-18 DIAGNOSIS — G4733 Obstructive sleep apnea (adult) (pediatric): Secondary | ICD-10-CM | POA: Insufficient documentation

## 2014-01-18 DIAGNOSIS — Z79899 Other long term (current) drug therapy: Secondary | ICD-10-CM | POA: Insufficient documentation

## 2014-01-18 DIAGNOSIS — E119 Type 2 diabetes mellitus without complications: Secondary | ICD-10-CM | POA: Insufficient documentation

## 2014-01-18 DIAGNOSIS — K5900 Constipation, unspecified: Secondary | ICD-10-CM | POA: Insufficient documentation

## 2014-01-18 DIAGNOSIS — R1010 Upper abdominal pain, unspecified: Secondary | ICD-10-CM

## 2014-01-18 HISTORY — DX: Other intervertebral disc degeneration, thoracic region: M51.34

## 2014-01-18 LAB — CBC WITH DIFFERENTIAL/PLATELET
Basophils Absolute: 0 10*3/uL (ref 0.0–0.1)
Basophils Relative: 1 % (ref 0–1)
EOS ABS: 0.3 10*3/uL (ref 0.0–0.7)
Eosinophils Relative: 5 % (ref 0–5)
HCT: 42.1 % (ref 36.0–46.0)
HEMOGLOBIN: 14.5 g/dL (ref 12.0–15.0)
LYMPHS ABS: 2.1 10*3/uL (ref 0.7–4.0)
LYMPHS PCT: 42 % (ref 12–46)
MCH: 28.1 pg (ref 26.0–34.0)
MCHC: 34.4 g/dL (ref 30.0–36.0)
MCV: 81.6 fL (ref 78.0–100.0)
MONOS PCT: 5 % (ref 3–12)
Monocytes Absolute: 0.2 10*3/uL (ref 0.1–1.0)
NEUTROS PCT: 47 % (ref 43–77)
Neutro Abs: 2.4 10*3/uL (ref 1.7–7.7)
Platelets: 213 10*3/uL (ref 150–400)
RBC: 5.16 MIL/uL — AB (ref 3.87–5.11)
RDW: 13.6 % (ref 11.5–15.5)
WBC: 5.1 10*3/uL (ref 4.0–10.5)

## 2014-01-18 LAB — URINE MICROSCOPIC-ADD ON

## 2014-01-18 LAB — URINALYSIS, ROUTINE W REFLEX MICROSCOPIC
Bilirubin Urine: NEGATIVE
GLUCOSE, UA: 500 mg/dL — AB
Ketones, ur: NEGATIVE mg/dL
LEUKOCYTES UA: NEGATIVE
Nitrite: NEGATIVE
Specific Gravity, Urine: 1.02 (ref 1.005–1.030)
Urobilinogen, UA: 0.2 mg/dL (ref 0.0–1.0)
pH: 6 (ref 5.0–8.0)

## 2014-01-18 LAB — COMPREHENSIVE METABOLIC PANEL
ALT: 13 U/L (ref 0–35)
AST: 10 U/L (ref 0–37)
Albumin: 2.4 g/dL — ABNORMAL LOW (ref 3.5–5.2)
Alkaline Phosphatase: 80 U/L (ref 39–117)
BUN: 16 mg/dL (ref 6–23)
CALCIUM: 9.3 mg/dL (ref 8.4–10.5)
CO2: 27 mEq/L (ref 19–32)
Chloride: 103 mEq/L (ref 96–112)
Creatinine, Ser: 1.25 mg/dL — ABNORMAL HIGH (ref 0.50–1.10)
GFR calc non Af Amer: 51 mL/min — ABNORMAL LOW (ref >90)
GFR, EST AFRICAN AMERICAN: 59 mL/min — AB (ref >90)
Glucose, Bld: 278 mg/dL — ABNORMAL HIGH (ref 70–99)
Potassium: 3.9 mEq/L (ref 3.7–5.3)
Sodium: 140 mEq/L (ref 137–147)
TOTAL PROTEIN: 6.2 g/dL (ref 6.0–8.3)
Total Bilirubin: 0.2 mg/dL — ABNORMAL LOW (ref 0.3–1.2)

## 2014-01-18 LAB — LIPASE, BLOOD: Lipase: 26 U/L (ref 11–59)

## 2014-01-18 MED ORDER — MORPHINE SULFATE 4 MG/ML IJ SOLN
4.0000 mg | Freq: Once | INTRAMUSCULAR | Status: AC
  Administered 2014-01-18: 4 mg via INTRAVENOUS
  Filled 2014-01-18: qty 1

## 2014-01-18 MED ORDER — SODIUM CHLORIDE 0.9 % IV BOLUS (SEPSIS)
1000.0000 mL | Freq: Once | INTRAVENOUS | Status: AC
  Administered 2014-01-18: 1000 mL via INTRAVENOUS

## 2014-01-18 MED ORDER — ONDANSETRON HCL 4 MG/2ML IJ SOLN
4.0000 mg | Freq: Once | INTRAMUSCULAR | Status: DC
  Filled 2014-01-18: qty 2

## 2014-01-18 MED ORDER — ONDANSETRON HCL 4 MG/2ML IJ SOLN
4.0000 mg | Freq: Once | INTRAMUSCULAR | Status: AC
  Administered 2014-01-18: 4 mg via INTRAVENOUS

## 2014-01-18 MED ORDER — ONDANSETRON HCL 4 MG PO TABS: 4.0000 mg | ORAL_TABLET | Freq: Three times a day (TID) | ORAL | Status: DC | PRN

## 2014-01-18 NOTE — ED Provider Notes (Signed)
CSN: [id number]     Arrival date & time 01/18/14  1254 History   First MD Initiated Contact with Patient 01/18/14 1358     Chief Complaint  Patient presents with  . Abdominal Pain     (Consider location/radiation/quality/duration/timing/severity/associated sxs/prior Treatment) The history is provided by the patient.    Martha [name] is a 48 y.o. female with a past medical history of diabetes, htn, acid reflux and copd presenting with a 3 day history of waxing and waning crampy upper abdominal pain.  She reports nasuea without emesis but has been able to maintain PO intake. She denies fevers, chills, diarrhea, rather has fairly chronic constipation.  Her last stool which was quite hard was last night.  She is concerned about seeing something white in this stool, and did not have her glasses on, but wondered about a worm when she saw it.  It was not moving and she has seen nothing similar before or since this stool.  She has had no foreign travel, no history of eating improperly cooked pork or beef and no exposures to any individuals with these risk factors. She has tried no treatments for her symtpoms has has found no alleviators. Movement makes her symptoms worse and is better at rest.     Past Medical History  Diagnosis Date  . Diabetes mellitus   . Hypertension   . RLS (restless legs syndrome)   . Acid reflux   . Bipolar 1 disorder   . Depression   . OSA (obstructive sleep apnea)     cpap  . Anemia   . COPD (chronic obstructive pulmonary disease)     bronchitis  . Arthritis   . Degenerative disc disease, thoracic    Past Surgical History  Procedure Laterality Date  . Tubal ligation    . Endometrial ablation    . Colonoscopy  06/06/2010    CXT:[id number] bleeding secondary to internal hemorrhoids but incomplete evaluation secondary to poor right colon prep/small rectal and sigmoid colon polyps (hyperplastic). PROPOFOL  . Esophagogastroduodenoscopy  06/06/2010    KJZ:PEPVRUS  erythema and edema of body of stomach, with sessile polypoid lesions. bx benign. no h.pylori  . Colonoscopy  May 2013    Dr. Gilliam/[facility]: 5 mm a descending colon polyp, hyperplastic. Adequate bowel prep.  . Flexible sigmoidoscopy N/A 05/17/2013    Procedure: FLEXIBLE SIGMOIDOSCOPY WITH PROPOFOL;  Surgeon: West Bali, MD;  Location: [facility];  Service: Endoscopy;  Laterality: N/A;  . Esophagogastroduodenoscopy (egd) with propofol N/A 05/17/2013    Procedure: ESOPHAGOGASTRODUODENOSCOPY (EGD) WITH PROPOFOL;  Surgeon: West Bali, MD;  Location: [facility];  Service: Endoscopy;  Laterality: N/A;  g junction is 42 cm  . Savory dilation N/A 05/17/2013    Procedure: SAVORY DILATION;  Surgeon: West Bali, MD;  Location: [facility];  Service: Endoscopy;  Laterality: N/A;  14/15/16  . Hemorrhoid banding N/A 05/17/2013    Procedure: HEMORRHOID BANDING;  Surgeon: West Bali, MD;  Location: [facility];  Service: Endoscopy;  Laterality: N/A;  2 bands placed  . Esophageal biopsy N/A 05/17/2013    Procedure: BIOPSY;  Surgeon: West Bali, MD;  Location: [facility];  Service: Endoscopy;  Laterality: N/A;  . Irrigation and debridement abscess Right 06/01/2013    Procedure: INCISION AND DRAINAGE AND DEBRIDEMENT ABSCESS RIGHT BREAST;  Surgeon: Marlane Hatcher, MD;  Location: [facility];  Service: General;  Laterality: Right;  . Incision and drainage abscess Left 10/11/2013    Procedure: INCISION AND  DRAINAGE AND DEBRIDEMENT LEFT BREAST  ABSCESS;  Surgeon: [name], MD;  Location: [facility];  Service: General;  Laterality: Left;   Family History  Problem Relation Age of Onset  . Adopted: Yes   History  Substance Use Topics  . Smoking status: Former Smoker -- 1.50 packs/day for 25 years    Types: Cigarettes    Quit date: 10/23/2007  . Smokeless tobacco: Former Systems developer  . Alcohol Use: No   OB History   Grav Para Term Preterm Abortions TAB SAB Ect Mult Living   '2 2 1 1      2     '$ Review of Systems   Constitutional: Negative for fever and chills.  HENT: Negative for congestion and sore throat.   Eyes: Negative.   Respiratory: Negative for chest tightness and shortness of breath.   Cardiovascular: Negative for chest pain.  Gastrointestinal: Positive for nausea, abdominal pain and constipation. Negative for vomiting and diarrhea.  Genitourinary: Negative.   Musculoskeletal: Negative for arthralgias, joint swelling and neck pain.  Skin: Negative.  Negative for rash and wound.  Neurological: Negative for dizziness, weakness, light-headedness, numbness and headaches.  Psychiatric/Behavioral: Negative.       Allergies  Codeine  Home Medications   Prior to Admission medications   Medication Sig Start Date End Date Taking? Authorizing Provider  albuterol (PROVENTIL) (2.5 MG/3ML) 0.083% nebulizer solution Take 2.5 mg by nebulization 4 (four) times daily.    Yes Historical Provider, MD  ALPRAZolam Duanne Moron) 1 MG tablet Take 1 mg by mouth 4 (four) times daily.     Yes Historical Provider, MD  amLODipine (NORVASC) 10 MG tablet Take 10 mg by mouth daily.   Yes Historical Provider, MD  CINNAMON PO Take 1 capsule by mouth daily.   Yes Historical Provider, MD  HYDROcodone-acetaminophen (NORCO) 10-325 MG per tablet Take 1 tablet by mouth every 6 (six) hours as needed for pain.   Yes Historical Provider, MD  Insulin Aspart Prot & Aspart (NOVOLOG MIX 70/30 FLEXPEN) (70-30) 100 UNIT/ML Pen Inject 60 Units into the skin 2 (two) times daily.    Yes Historical Provider, MD  lisinopril-hydrochlorothiazide (PRINZIDE,ZESTORETIC) 20-12.5 MG per tablet Take 1 tablet by mouth 2 (two) times daily.    Yes Historical Provider, MD  metFORMIN (GLUCOPHAGE) 1000 MG tablet Take 1,000 mg by mouth 2 (two) times daily with a meal.   Yes Historical Provider, MD  omeprazole (PRILOSEC) 20 MG capsule Take 20 mg by mouth 2 (two) times daily.    Yes Historical Provider, MD  Pseudoephedrine-Ibuprofen (ADVIL COLD/SINUS PO) Take  1 tablet by mouth as needed (sinus headache).   Yes Historical Provider, MD  rOPINIRole (REQUIP) 1 MG tablet Take 1 mg by mouth at bedtime.    Yes Historical Provider, MD  simvastatin (ZOCOR) 40 MG tablet Take 40 mg by mouth every evening.   Yes Historical Provider, MD  albuterol (PROAIR HFA) 108 (90 BASE) MCG/ACT inhaler Inhale 2 puffs into the lungs every 6 (six) hours as needed for wheezing or shortness of breath.    Historical Provider, MD  ondansetron (ZOFRAN) 4 MG tablet Take 1 tablet (4 mg total) by mouth every 8 (eight) hours as needed for nausea. 01/18/14   Evalee Jefferson, PA-C  UNKNOWN TO PATIENT Apply 1 application topically daily as needed (for irritation (vaginal area) COMPOUND MEDICATION: Ketoconazole mixed with another medication  (name is unknown)).    Historical Provider, MD   BP 175/96  Pulse 83  Temp(Src) 97.9 F (  36.6 C) (Oral)  Resp 20  Ht $R'5\' 8"'SF$  (1.727 m)  Wt 365 lb (165.563 kg)  BMI 55.51 kg/m2  SpO2 96% Physical Exam  Nursing note and vitals reviewed. Constitutional: She appears well-developed and well-nourished.  obese  HENT:  Head: Normocephalic and atraumatic.  Eyes: Conjunctivae are normal.  Neck: Normal range of motion.  Cardiovascular: Normal rate, regular rhythm, normal heart sounds and intact distal pulses.   Pulmonary/Chest: Effort normal and breath sounds normal. She has no wheezes.  Abdominal: Soft. Bowel sounds are normal. There is no tenderness.  Musculoskeletal: Normal range of motion.  Neurological: She is alert.  Skin: Skin is warm and dry.  Psychiatric: She has a normal mood and affect.    ED Course  Procedures (including critical care time) Labs Review Labs Reviewed  COMPREHENSIVE METABOLIC PANEL - Abnormal; Notable for the following:    Glucose, Bld 278 (*)    Creatinine, Ser 1.25 (*)    Albumin 2.4 (*)    Total Bilirubin 0.2 (*)    GFR calc non Af Amer 51 (*)    GFR calc Af Amer 59 (*)    All other components within normal limits  CBC  WITH DIFFERENTIAL - Abnormal; Notable for the following:    RBC 5.16 (*)    All other components within normal limits  URINALYSIS, ROUTINE W REFLEX MICROSCOPIC - Abnormal; Notable for the following:    Glucose, UA 500 (*)    Hgb urine dipstick SMALL (*)    Protein, ur >300 (*)    All other components within normal limits  URINE MICROSCOPIC-ADD ON - Abnormal; Notable for the following:    Squamous Epithelial / LPF MANY (*)    Bacteria, UA FEW (*)    Casts GRANULAR CAST (*)    All other components within normal limits  LIPASE, BLOOD    Imaging Review No results found.   EKG Interpretation None      MDM   Final diagnoses:  Pain of upper abdomen    Patients labs and/or radiological studies were viewed and considered during the medical decision making and disposition process. Pt with upper abdominal pain, normal GI workup, hyperglycemia which is chronic, no acidosis.  She appears comfortable and serial exams reveal a normal abdominal exam, no guarding, non acute abdomen.  At last exam,  She was sitting in a chair, had set up her tablet and was reading.  No distress. Stool sample for O & P was not provided.  Collection kit given, pt will return to lab if she can produce a sample.  She was prescribed zofran prn.  Advised recheck by pcp or return here for worsened or persistent sx.    Evalee Jefferson, PA-C 01/19/14 2149

## 2014-01-18 NOTE — ED Notes (Signed)
Abdominal pain for 2-3 days

## 2014-01-20 NOTE — ED Provider Notes (Signed)
Medical screening examination/treatment/procedure(s) were conducted as a shared visit with non-physician practitioner(s) and myself.  I personally evaluated the patient during the encounter.   EKG Interpretation None     No clinical evidence of an acute abdomen. White count normal. Liver functions normal  Nat Christen, MD 01/20/14 [phone]

## 2014-01-29 ENCOUNTER — Ambulatory Visit
Admission: RE | Admit: 2014-01-29 | Discharge: 2014-01-29 | Disposition: A | Discharge status: Home / Self Care | Payer: Medicaid Other | Source: Ambulatory Visit | Attending: Pulmonary Disease | Admitting: Pulmonary Disease

## 2014-01-29 DIAGNOSIS — M549 Dorsalgia, unspecified: Secondary | ICD-10-CM

## 2014-02-03 ENCOUNTER — Emergency Department (HOSPITAL_COMMUNITY): Payer: Medicaid Other

## 2014-02-03 ENCOUNTER — Encounter (HOSPITAL_COMMUNITY): Payer: Self-pay | Admitting: Emergency Medicine

## 2014-02-03 ENCOUNTER — Emergency Department (HOSPITAL_COMMUNITY)
Admission: EM | Admit: 2014-02-03 | Discharge: 2014-02-03 | Disposition: A | Discharge status: Home / Self Care | Payer: Medicaid Other | Attending: Emergency Medicine | Admitting: Emergency Medicine

## 2014-02-03 DIAGNOSIS — G4733 Obstructive sleep apnea (adult) (pediatric): Secondary | ICD-10-CM | POA: Insufficient documentation

## 2014-02-03 DIAGNOSIS — Z87891 Personal history of nicotine dependence: Secondary | ICD-10-CM | POA: Insufficient documentation

## 2014-02-03 DIAGNOSIS — Z9889 Other specified postprocedural states: Secondary | ICD-10-CM | POA: Diagnosis not present

## 2014-02-03 DIAGNOSIS — Z79899 Other long term (current) drug therapy: Secondary | ICD-10-CM | POA: Diagnosis not present

## 2014-02-03 DIAGNOSIS — I10 Essential (primary) hypertension: Secondary | ICD-10-CM | POA: Diagnosis not present

## 2014-02-03 DIAGNOSIS — K8000 Calculus of gallbladder with acute cholecystitis without obstruction: Secondary | ICD-10-CM | POA: Insufficient documentation

## 2014-02-03 DIAGNOSIS — J449 Chronic obstructive pulmonary disease, unspecified: Secondary | ICD-10-CM | POA: Insufficient documentation

## 2014-02-03 DIAGNOSIS — M129 Arthropathy, unspecified: Secondary | ICD-10-CM | POA: Insufficient documentation

## 2014-02-03 DIAGNOSIS — K8020 Calculus of gallbladder without cholecystitis without obstruction: Secondary | ICD-10-CM

## 2014-02-03 DIAGNOSIS — Z794 Long term (current) use of insulin: Secondary | ICD-10-CM | POA: Diagnosis not present

## 2014-02-03 DIAGNOSIS — E119 Type 2 diabetes mellitus without complications: Secondary | ICD-10-CM | POA: Insufficient documentation

## 2014-02-03 DIAGNOSIS — F319 Bipolar disorder, unspecified: Secondary | ICD-10-CM | POA: Insufficient documentation

## 2014-02-03 DIAGNOSIS — J4489 Other specified chronic obstructive pulmonary disease: Secondary | ICD-10-CM | POA: Insufficient documentation

## 2014-02-03 DIAGNOSIS — E669 Obesity, unspecified: Secondary | ICD-10-CM | POA: Insufficient documentation

## 2014-02-03 DIAGNOSIS — R739 Hyperglycemia, unspecified: Secondary | ICD-10-CM

## 2014-02-03 DIAGNOSIS — R1032 Left lower quadrant pain: Secondary | ICD-10-CM | POA: Insufficient documentation

## 2014-02-03 LAB — COMPREHENSIVE METABOLIC PANEL
ALBUMIN: 2.4 g/dL — AB (ref 3.5–5.2)
ALT: 12 U/L (ref 0–35)
ANION GAP: 10 (ref 5–15)
AST: 9 U/L (ref 0–37)
Alkaline Phosphatase: 88 U/L (ref 39–117)
BUN: 17 mg/dL (ref 6–23)
CO2: 29 mEq/L (ref 19–32)
Calcium: 9.3 mg/dL (ref 8.4–10.5)
Chloride: 102 mEq/L (ref 96–112)
Creatinine, Ser: 1.35 mg/dL — ABNORMAL HIGH (ref 0.50–1.10)
GFR calc Af Amer: 54 mL/min — ABNORMAL LOW (ref >90)
GFR calc non Af Amer: 47 mL/min — ABNORMAL LOW (ref >90)
Glucose, Bld: 413 mg/dL — ABNORMAL HIGH (ref 70–99)
Potassium: 3.8 mEq/L (ref 3.7–5.3)
SODIUM: 141 meq/L (ref 137–147)
TOTAL PROTEIN: 6.3 g/dL (ref 6.0–8.3)
Total Bilirubin: 0.2 mg/dL — ABNORMAL LOW (ref 0.3–1.2)

## 2014-02-03 LAB — URINALYSIS, ROUTINE W REFLEX MICROSCOPIC
BILIRUBIN URINE: NEGATIVE
Glucose, UA: >1000 mg/dL — AB
Ketones, ur: NEGATIVE mg/dL
Leukocytes, UA: NEGATIVE
NITRITE: NEGATIVE
PH: 6.5 (ref 5.0–8.0)
Protein, ur: >300 mg/dL — AB
Specific Gravity, Urine: 1.02 (ref 1.005–1.030)
UROBILINOGEN UA: 0.2 mg/dL (ref 0.0–1.0)

## 2014-02-03 LAB — CBC WITH DIFFERENTIAL/PLATELET
BASOS PCT: 1 % (ref 0–1)
Basophils Absolute: 0.1 10*3/uL (ref 0.0–0.1)
EOS ABS: 0.2 10*3/uL (ref 0.0–0.7)
Eosinophils Relative: 3 % (ref 0–5)
HCT: 42.9 % (ref 36.0–46.0)
HEMOGLOBIN: 14.9 g/dL (ref 12.0–15.0)
Lymphocytes Relative: 35 % (ref 12–46)
Lymphs Abs: 2.4 10*3/uL (ref 0.7–4.0)
MCH: 28.4 pg (ref 26.0–34.0)
MCHC: 34.7 g/dL (ref 30.0–36.0)
MCV: 81.7 fL (ref 78.0–100.0)
Monocytes Absolute: 0.3 10*3/uL (ref 0.1–1.0)
Monocytes Relative: 5 % (ref 3–12)
NEUTROS ABS: 3.8 10*3/uL (ref 1.7–7.7)
NEUTROS PCT: 56 % (ref 43–77)
PLATELETS: 212 10*3/uL (ref 150–400)
RBC: 5.25 MIL/uL — ABNORMAL HIGH (ref 3.87–5.11)
RDW: 13.8 % (ref 11.5–15.5)
WBC: 6.7 10*3/uL (ref 4.0–10.5)

## 2014-02-03 LAB — LIPASE, BLOOD: Lipase: 35 U/L (ref 11–59)

## 2014-02-03 LAB — URINE MICROSCOPIC-ADD ON

## 2014-02-03 MED ORDER — IOHEXOL 300 MG/ML  SOLN
50.0000 mL | Freq: Once | INTRAMUSCULAR | Status: AC | PRN
  Administered 2014-02-03: 50 mL via ORAL

## 2014-02-03 MED ORDER — SODIUM CHLORIDE 0.9 % IV SOLN
1000.0000 mL | Freq: Once | INTRAVENOUS | Status: AC
  Administered 2014-02-03: 1000 mL via INTRAVENOUS

## 2014-02-03 MED ORDER — IOHEXOL 300 MG/ML  SOLN
100.0000 mL | Freq: Once | INTRAMUSCULAR | Status: AC | PRN
  Administered 2014-02-03: 100 mL via INTRAVENOUS

## 2014-02-03 MED ORDER — PROMETHAZINE HCL 25 MG PO TABS: 25.0000 mg | ORAL_TABLET | Freq: Four times a day (QID) | ORAL | Status: DC | PRN

## 2014-02-03 MED ORDER — OMEPRAZOLE 20 MG PO CPDR: DELAYED_RELEASE_CAPSULE | ORAL | Status: DC

## 2014-02-03 MED ORDER — SODIUM CHLORIDE 0.9 % IV SOLN: 1000.0000 mL | INTRAVENOUS | Status: DC

## 2014-02-03 MED ORDER — ONDANSETRON HCL 4 MG/2ML IJ SOLN
4.0000 mg | Freq: Once | INTRAMUSCULAR | Status: AC
  Administered 2014-02-03: 4 mg via INTRAVENOUS
  Filled 2014-02-03: qty 2

## 2014-02-03 MED ORDER — FENTANYL CITRATE 0.05 MG/ML IJ SOLN
50.0000 ug | INTRAMUSCULAR | Status: DC | PRN
  Administered 2014-02-03 (×2): 50 ug via INTRAVENOUS
  Filled 2014-02-03 (×2): qty 2

## 2014-02-03 NOTE — ED Notes (Signed)
Dr Tomi Bamberger at bedside discussing pain management and diabetes management with pt.

## 2014-02-03 NOTE — ED Notes (Signed)
Pt reports LLQ abdominal pain x2 weeks. Pt reports was seen for same last week with no relief. Pt reports nausea and intermittent "knot" above umbilicus. nad noted. Pt denies any v/d,gu symptoms.

## 2014-02-03 NOTE — ED Provider Notes (Signed)
CSN: [id number]     Arrival date & time 02/03/14  1347 History  This chart was scribed for [name], MD by Ludger Nutting, ED Scribe. This patient was seen in room [facility]08/[facility]08 and the patient's care was started 2:32 PM.    Chief Complaint  Patient presents with  . Abdominal Pain    The history is provided by the patient. No language interpreter was used.    HPI Comments: Martha [name] is a 48 y.o. female who presents to the Emergency Department complaining of 2 weeks of intermittent swelling above the umbilicus without pain. She also reports 2 weeks of intermittent left sided abdominal pain that became constant 2 days ago. She describes the pain as achy. She has associated nausea, decreased appetite, and urinary frequency. She states the pain is aggravated by coughing, walking and lying supine. She states the pain is alleviated with standing and sitting. She denies history of similar symptoms in the past. She denies fever, dysuria, hematuria, hematochezia. Patient was seen on 01/18/14 for similar symptoms and was discharged home. She reports having multiple BM's over the last 1-2 days without relief of pain.    PCP Dr Luan Pulling GI Dr Oneida Alar Surgeon Dr Romona Curls for "boils"  Past Medical History  Diagnosis Date  . Diabetes mellitus   . Hypertension   . RLS (restless legs syndrome)   . Acid reflux   . Bipolar 1 disorder   . Depression   . OSA (obstructive sleep apnea)     cpap  . Anemia   . COPD (chronic obstructive pulmonary disease)     bronchitis  . Arthritis   . Degenerative disc disease, thoracic    Past Surgical History  Procedure Laterality Date  . Tubal ligation    . Endometrial ablation    . Colonoscopy  06/06/2010    [id number] bleeding secondary to internal hemorrhoids but incomplete evaluation secondary to poor right colon prep/small rectal and sigmoid colon polyps (hyperplastic). PROPOFOL  . Esophagogastroduodenoscopy  06/06/2010    [id number] erythema and edema of  body of stomach, with sessile polypoid lesions. bx benign. no h.pylori  . Colonoscopy  May 2013    Dr. Gilliam/[facility]: 5 mm a descending colon polyp, hyperplastic. Adequate bowel prep.  . Flexible sigmoidoscopy N/A 05/17/2013    Procedure: FLEXIBLE SIGMOIDOSCOPY WITH PROPOFOL;  Surgeon: [name], MD;  Location: [facility];  Service: Endoscopy;  Laterality: N/A;  . Esophagogastroduodenoscopy (egd) with propofol N/A 05/17/2013    Procedure: ESOPHAGOGASTRODUODENOSCOPY (EGD) WITH PROPOFOL;  Surgeon: [name], MD;  Location: [facility];  Service: Endoscopy;  Laterality: N/A;  g junction is 42 cm  . Savory dilation N/A 05/17/2013    Procedure: SAVORY DILATION;  Surgeon: [name], MD;  Location: [facility];  Service: Endoscopy;  Laterality: N/A;  14/15/16  . Hemorrhoid banding N/A 05/17/2013    Procedure: HEMORRHOID BANDING;  Surgeon: [name], MD;  Location: [facility];  Service: Endoscopy;  Laterality: N/A;  2 bands placed  . Esophageal biopsy N/A 05/17/2013    Procedure: BIOPSY;  Surgeon: [name], MD;  Location: [facility];  Service: Endoscopy;  Laterality: N/A;  . Irrigation and debridement abscess Right 06/01/2013    Procedure: INCISION AND DRAINAGE AND DEBRIDEMENT ABSCESS RIGHT BREAST;  Surgeon: [name], MD;  Location: [facility];  Service: General;  Laterality: Right;  . Incision and drainage abscess Left 10/11/2013    Procedure: INCISION AND DRAINAGE AND DEBRIDEMENT LEFT BREAST  ABSCESS;  Surgeon: [name], MD;  Location: [facility];  Service: General;  Laterality: Left;   Family History  Problem Relation Age of Onset  . Adopted: Yes   History  Substance Use Topics  . Smoking status: Former Smoker -- 1.50 packs/day for 25 years    Types: Cigarettes    Quit date: 10/23/2007  . Smokeless tobacco: Former Systems developer  . Alcohol Use: No   Pt on disability  OB History   Grav Para Term Preterm Abortions TAB SAB Ect Mult Living   2 2 1 1      2      Review of Systems   Constitutional: Positive for appetite change (decreased).  Gastrointestinal: Positive for nausea and abdominal pain. Negative for constipation.  Genitourinary: Positive for frequency.  All other systems reviewed and are negative.     Allergies  Codeine  Home Medications   Prior to Admission medications   Medication Sig Start Date End Date Taking? Authorizing Provider  albuterol (PROAIR HFA) 108 (90 BASE) MCG/ACT inhaler Inhale 2 puffs into the lungs every 6 (six) hours as needed for wheezing or shortness of breath.    Historical Provider, MD  albuterol (PROVENTIL) (2.5 MG/3ML) 0.083% nebulizer solution Take 2.5 mg by nebulization 4 (four) times daily.     Historical Provider, MD  ALPRAZolam Duanne Moron) 1 MG tablet Take 1 mg by mouth 4 (four) times daily.      Historical Provider, MD  amLODipine (NORVASC) 10 MG tablet Take 10 mg by mouth daily.    Historical Provider, MD  CINNAMON PO Take 1 capsule by mouth daily.    Historical Provider, MD  HYDROcodone-acetaminophen (NORCO) 10-325 MG per tablet Take 1 tablet by mouth every 6 (six) hours as needed for pain.    Historical Provider, MD  Insulin Aspart Prot & Aspart (NOVOLOG MIX 70/30 FLEXPEN) (70-30) 100 UNIT/ML Pen Inject 60 Units into the skin 2 (two) times daily.     Historical Provider, MD  lisinopril-hydrochlorothiazide (PRINZIDE,ZESTORETIC) 20-12.5 MG per tablet Take 1 tablet by mouth 2 (two) times daily.     Historical Provider, MD  metFORMIN (GLUCOPHAGE) 1000 MG tablet Take 1,000 mg by mouth 2 (two) times daily with a meal.    Historical Provider, MD  omeprazole (PRILOSEC) 20 MG capsule Take 20 mg by mouth 2 (two) times daily.     Historical Provider, MD  ondansetron (ZOFRAN) 4 MG tablet Take 1 tablet (4 mg total) by mouth every 8 (eight) hours as needed for nausea. 01/18/14   Evalee Jefferson, PA-C  Pseudoephedrine-Ibuprofen (ADVIL COLD/SINUS PO) Take 1 tablet by mouth as needed (sinus headache).    Historical Provider, MD  rOPINIRole  (REQUIP) 1 MG tablet Take 1 mg by mouth at bedtime.     Historical Provider, MD  simvastatin (ZOCOR) 40 MG tablet Take 40 mg by mouth every evening.    Historical Provider, MD  UNKNOWN TO PATIENT Apply 1 application topically daily as needed (for irritation (vaginal area) COMPOUND MEDICATION: Ketoconazole mixed with another medication  (name is unknown)).    Historical Provider, MD   BP 208/112  Pulse 93  Temp(Src) 99 F (37.2 C) (Oral)  Resp 22  Ht 5\' 7"  (1.702 m)  Wt 375 lb (170.099 kg)  BMI 58.72 kg/m2  SpO2 97%  Vital signs normal   Physical Exam  Nursing note and vitals reviewed. Constitutional: She is oriented to person, place, and time. She appears well-developed and well-nourished.  Non-toxic appearance. She does not appear ill. She  appears distressed.  obese  HENT:  Head: Normocephalic and atraumatic.  Right Ear: External ear normal.  Left Ear: External ear normal.  Nose: Nose normal. No mucosal edema or rhinorrhea.  Mouth/Throat: Oropharynx is clear and moist and mucous membranes are normal. No dental abscesses or uvula swelling.  Eyes: Conjunctivae and EOM are normal. Pupils are equal, round, and reactive to light.  Neck: Normal range of motion and full passive range of motion without pain. Neck supple.  Cardiovascular: Normal rate, regular rhythm and normal heart sounds.  Exam reveals no gallop and no friction rub.   No murmur heard. Pulmonary/Chest: Effort normal and breath sounds normal. No respiratory distress. She has no wheezes. She has no rhonchi. She has no rales. She exhibits no tenderness and no crepitus.  Abdominal: Soft. Normal appearance and bowel sounds are normal. She exhibits no distension. There is tenderness. There is no rebound and no guarding.    Tenderness to palpation LLQ and left lateral abdomen  Don't feel supraumbilical fullness when laying flat or semi-erect. Visually saw it when sitting up during interview.   Musculoskeletal: Normal range of  motion. She exhibits no edema and no tenderness.  Moves all extremities well.   Neurological: She is alert and oriented to person, place, and time. She has normal strength. No cranial nerve deficit.  Skin: Skin is warm, dry and intact. No rash noted. No erythema. No pallor.  Psychiatric: She has a normal mood and affect. Her speech is normal and behavior is normal. Her mood appears not anxious.    ED Course  Procedures (including critical care time) Medications  0.9 %  sodium chloride infusion (1,000 mLs Intravenous New Bag/Given 02/03/14 1503)    Followed by  0.9 %  sodium chloride infusion (not administered)  fentaNYL (SUBLIMAZE) injection 50 mcg (50 mcg Intravenous Given 02/03/14 1504)  ondansetron (ZOFRAN) injection 4 mg (4 mg Intravenous Given 02/03/14 1504)  iohexol (OMNIPAQUE) 300 MG/ML solution 50 mL (50 mLs Oral Contrast Given 02/03/14 1552)  iohexol (OMNIPAQUE) 300 MG/ML solution 100 mL (100 mLs Intravenous Contrast Given 02/03/14 1551)     DIAGNOSTIC STUDIES: Oxygen Saturation is 97% on RA, adequate by my interpretation.    COORDINATION OF CARE: 2:32 PM Discussed treatment plan with pt at bedside and pt agreed to plan.  Review of old records: She had a colonoscopy in May 2013 which showed a polyp that was removed but otherwise normal. PT was seen in the ED on 6/24 for abdominal pain.  16:40 pt getting her results of her tests (UA still pending). She is requesting to eat and drink  Unfortunately the etiology of her pain was not found today.     Labs Review Results for orders placed during the hospital encounter of 02/03/14  CBC WITH DIFFERENTIAL      Result Value Ref Range   WBC 6.7  4.0 - 10.5 K/uL   RBC 5.25 (*) 3.87 - 5.11 MIL/uL   Hemoglobin 14.9  12.0 - 15.0 g/dL   HCT 42.9  36.0 - 46.0 %   MCV 81.7  78.0 - 100.0 fL   MCH 28.4  26.0 - 34.0 pg   MCHC 34.7  30.0 - 36.0 g/dL   RDW 13.8  11.5 - 15.5 %   Platelets 212  150 - 400 K/uL   Neutrophils Relative % 56  43  - 77 %   Neutro Abs 3.8  1.7 - 7.7 K/uL   Lymphocytes Relative 35  12 - 46 %  Lymphs Abs 2.4  0.7 - 4.0 K/uL   Monocytes Relative 5  3 - 12 %   Monocytes Absolute 0.3  0.1 - 1.0 K/uL   Eosinophils Relative 3  0 - 5 %   Eosinophils Absolute 0.2  0.0 - 0.7 K/uL   Basophils Relative 1  0 - 1 %   Basophils Absolute 0.1  0.0 - 0.1 K/uL  COMPREHENSIVE METABOLIC PANEL      Result Value Ref Range   Sodium 141  137 - 147 mEq/L   Potassium 3.8  3.7 - 5.3 mEq/L   Chloride 102  96 - 112 mEq/L   CO2 29  19 - 32 mEq/L   Glucose, Bld 413 (*) 70 - 99 mg/dL   BUN 17  6 - 23 mg/dL   Creatinine, Ser 1.35 (*) 0.50 - 1.10 mg/dL   Calcium 9.3  8.4 - 10.5 mg/dL   Total Protein 6.3  6.0 - 8.3 g/dL   Albumin 2.4 (*) 3.5 - 5.2 g/dL   AST 9  0 - 37 U/L   ALT 12  0 - 35 U/L   Alkaline Phosphatase 88  39 - 117 U/L   Total Bilirubin 0.2 (*) 0.3 - 1.2 mg/dL   GFR calc non Af Amer 47 (*) >90 mL/min   GFR calc Af Amer 54 (*) >90 mL/min   Anion gap 10  5 - 15  LIPASE, BLOOD      Result Value Ref Range   Lipase 35  11 - 59 U/L  URINALYSIS, ROUTINE W REFLEX MICROSCOPIC      Result Value Ref Range   Color, Urine YELLOW  YELLOW   APPearance CLEAR  CLEAR   Specific Gravity, Urine 1.020  1.005 - 1.030   pH 6.5  5.0 - 8.0   Glucose, UA >1000 (*) NEGATIVE mg/dL   Hgb urine dipstick SMALL (*) NEGATIVE   Bilirubin Urine NEGATIVE  NEGATIVE   Ketones, ur NEGATIVE  NEGATIVE mg/dL   Protein, ur >300 (*) NEGATIVE mg/dL   Urobilinogen, UA 0.2  0.0 - 1.0 mg/dL   Nitrite NEGATIVE  NEGATIVE   Leukocytes, UA NEGATIVE  NEGATIVE  URINE MICROSCOPIC-ADD ON      Result Value Ref Range   Squamous Epithelial / LPF MANY (*) RARE   WBC, UA 7-10  <3 WBC/hpf   RBC / HPF 3-6  <3 RBC/hpf   Bacteria, UA FEW (*) RARE    Laboratory interpretation all normal except hyperglycemia, glucosuria with proteinuria, contaminated urine sample    Imaging Review Ct Abdomen Pelvis W Contrast  02/03/2014   CLINICAL DATA:  LEFT lower  quadrant pain, nausea, fever, question diverticulitis or hernia, past history diabetes, hypertension, COPD, and anemia, smoking  EXAM: CT ABDOMEN AND PELVIS WITH CONTRAST  TECHNIQUE: Multidetector CT imaging of the abdomen and pelvis was performed using the standard protocol following bolus administration of intravenous contrast. Sagittal and coronal MPR images reconstructed from axial data set.  CONTRAST:  190mL OMNIPAQUE IOHEXOL 300 MG/ML SOLN IV, 21mL OMNIPAQUE IOHEXOL 300 MG/ML SOLN ORALLY  COMPARISON:  03/14/2013  FINDINGS: Lung bases clear.  Calcified 2.3 cm diameter gallstone within gallbladder.  Liver, spleen, pancreas, kidneys, and adrenal glands normal.  Unremarkable bladder, ureters, uterus, and adnexae.  Normal appendix.  Stomach and bowel loops unremarkable.  Specifically no evidence of colonic diverticula or wall thickening to suggest acute diverticulitis.  No mass, adenopathy, free fluid, free air or inflammatory process.  No hernia or acute bone lesions.  IMPRESSION: Cholelithiasis.  Otherwise negative exam.   Electronically Signed   By: Lavonia Dana M.D.   On: 02/03/2014 16:13     EKG Interpretation None      MDM   Final diagnoses:  Left lower quadrant pain  Gallstone  Hyperglycemia    New Prescriptions   OMEPRAZOLE (PRILOSEC) 20 MG CAPSULE    Take 1 po BID x 2 weeks then once a day   PROMETHAZINE (PHENERGAN) 25 MG TABLET    Take 1 tablet (25 mg total) by mouth every 6 (six) hours as needed for nausea or vomiting.     Plan discharge  Rolland Porter, MD, FACEP   I personally performed the services described in this documentation, which was scribed in my presence. The recorded information has been reviewed and considered.  Rolland Porter, MD, FACEP   [name], MD 02/03/14 [phone]

## 2014-02-03 NOTE — Discharge Instructions (Signed)
You need to follow a diabetic diet, your blood sugar was in the 400's today. You blood tests did not show anything acute today and your CT scan showed you have a calcified gallstone but there was no abnormality that would explain the pain in your left side. Take the omeprazole as prescribed and follow up with the gastroenterologist on call tonight, Dr Oneida Alar. Call her office to get an appointment.  Use the phenergan for nausea or vomiting.

## 2014-02-04 ENCOUNTER — Encounter (HOSPITAL_COMMUNITY): Payer: Self-pay | Admitting: Emergency Medicine

## 2014-02-04 ENCOUNTER — Emergency Department (HOSPITAL_COMMUNITY)
Admission: EM | Admit: 2014-02-04 | Discharge: 2014-02-05 | Disposition: A | Discharge status: Home / Self Care | Payer: Medicaid Other | Attending: Emergency Medicine | Admitting: Emergency Medicine

## 2014-02-04 DIAGNOSIS — F319 Bipolar disorder, unspecified: Secondary | ICD-10-CM | POA: Diagnosis not present

## 2014-02-04 DIAGNOSIS — R112 Nausea with vomiting, unspecified: Secondary | ICD-10-CM | POA: Insufficient documentation

## 2014-02-04 DIAGNOSIS — K219 Gastro-esophageal reflux disease without esophagitis: Secondary | ICD-10-CM | POA: Insufficient documentation

## 2014-02-04 DIAGNOSIS — N289 Disorder of kidney and ureter, unspecified: Secondary | ICD-10-CM

## 2014-02-04 DIAGNOSIS — Z87891 Personal history of nicotine dependence: Secondary | ICD-10-CM | POA: Diagnosis not present

## 2014-02-04 DIAGNOSIS — R1084 Generalized abdominal pain: Secondary | ICD-10-CM | POA: Diagnosis present

## 2014-02-04 DIAGNOSIS — R739 Hyperglycemia, unspecified: Secondary | ICD-10-CM

## 2014-02-04 DIAGNOSIS — G4733 Obstructive sleep apnea (adult) (pediatric): Secondary | ICD-10-CM | POA: Insufficient documentation

## 2014-02-04 DIAGNOSIS — E119 Type 2 diabetes mellitus without complications: Secondary | ICD-10-CM | POA: Insufficient documentation

## 2014-02-04 DIAGNOSIS — Z79899 Other long term (current) drug therapy: Secondary | ICD-10-CM | POA: Insufficient documentation

## 2014-02-04 DIAGNOSIS — I10 Essential (primary) hypertension: Secondary | ICD-10-CM | POA: Diagnosis not present

## 2014-02-04 DIAGNOSIS — R1033 Periumbilical pain: Secondary | ICD-10-CM | POA: Diagnosis not present

## 2014-02-04 DIAGNOSIS — Z7982 Long term (current) use of aspirin: Secondary | ICD-10-CM | POA: Insufficient documentation

## 2014-02-04 DIAGNOSIS — M129 Arthropathy, unspecified: Secondary | ICD-10-CM | POA: Insufficient documentation

## 2014-02-04 DIAGNOSIS — Z862 Personal history of diseases of the blood and blood-forming organs and certain disorders involving the immune mechanism: Secondary | ICD-10-CM | POA: Diagnosis not present

## 2014-02-04 DIAGNOSIS — J4489 Other specified chronic obstructive pulmonary disease: Secondary | ICD-10-CM | POA: Insufficient documentation

## 2014-02-04 DIAGNOSIS — J449 Chronic obstructive pulmonary disease, unspecified: Secondary | ICD-10-CM | POA: Diagnosis not present

## 2014-02-04 DIAGNOSIS — E876 Hypokalemia: Secondary | ICD-10-CM

## 2014-02-04 LAB — URINALYSIS, ROUTINE W REFLEX MICROSCOPIC
Bilirubin Urine: NEGATIVE
Glucose, UA: 500 mg/dL — AB
Ketones, ur: NEGATIVE mg/dL
Leukocytes, UA: NEGATIVE
Nitrite: NEGATIVE
Specific Gravity, Urine: 1.02 (ref 1.005–1.030)
UROBILINOGEN UA: 0.2 mg/dL (ref 0.0–1.0)
pH: 7 (ref 5.0–8.0)

## 2014-02-04 LAB — URINE MICROSCOPIC-ADD ON

## 2014-02-04 LAB — CBG MONITORING, ED: Glucose-Capillary: 191 mg/dL — ABNORMAL HIGH (ref 70–99)

## 2014-02-04 MED ORDER — ONDANSETRON HCL 4 MG/2ML IJ SOLN
4.0000 mg | Freq: Once | INTRAMUSCULAR | Status: AC
Start: 2014-02-04 — End: 2014-02-04
  Administered 2014-02-04: 4 mg via INTRAVENOUS
  Filled 2014-02-04: qty 2

## 2014-02-04 MED ORDER — AMLODIPINE BESYLATE 5 MG PO TABS
10.0000 mg | ORAL_TABLET | Freq: Once | ORAL | Status: AC
  Administered 2014-02-04: 10 mg via ORAL
  Filled 2014-02-04: qty 2

## 2014-02-04 MED ORDER — MORPHINE SULFATE 4 MG/ML IJ SOLN
4.0000 mg | Freq: Once | INTRAMUSCULAR | Status: AC
  Administered 2014-02-04: 4 mg via INTRAVENOUS
  Filled 2014-02-04: qty 1

## 2014-02-04 MED ORDER — LISINOPRIL 10 MG PO TABS
10.0000 mg | ORAL_TABLET | ORAL | Status: AC
  Administered 2014-02-04: 10 mg via ORAL
  Filled 2014-02-04: qty 1

## 2014-02-04 MED ORDER — SODIUM CHLORIDE 0.9 % IV BOLUS (SEPSIS)
1000.0000 mL | Freq: Once | INTRAVENOUS | Status: AC
  Administered 2014-02-04: 1000 mL via INTRAVENOUS

## 2014-02-04 NOTE — ED Notes (Signed)
Pt c/o continued  pain to her left lower abdomen. Pt c/o nausea.

## 2014-02-04 NOTE — ED Provider Notes (Signed)
CSN: [id number]     Arrival date & time 02/04/14  2116 History   This chart was scribed for Johnna Acosta, MD by Girtha Hake, ED Scribe. The patient was seen in [facility]. The patient's care was started at 11:19 PM.   No chief complaint on file.  The history is provided by the patient.   HPI Comments: GLORIANNA CRANNELL is a 48 y.o. female with a history of DM who presents to the Emergency Department complaining of persistent periumbilical abdominal pain beginning several days ago. Patient denies diarrhea, fever, or chills. She reports that the quality of the pain varies (sometimes aching, sometimes burning). Patient reports associated nausea and vomiting (with eating). Patient reports dysuria earlier today.   EMR reviewed. CT scan from yesterday without acute findings done with contrast.  Had ED visit - cholelithiais found, no signs of cholecystitis  Off PO diabetic because of CT. Remains hyperglycemic.  She has been compliant with insulin.   PCP is Dr. Luan Pulling.  Past Medical History  Diagnosis Date  . Diabetes mellitus   . Hypertension   . RLS (restless legs syndrome)   . Acid reflux   . Bipolar 1 disorder   . Depression   . OSA (obstructive sleep apnea)     cpap  . Anemia   . COPD (chronic obstructive pulmonary disease)     bronchitis  . Arthritis   . Degenerative disc disease, thoracic    Past Surgical History  Procedure Laterality Date  . Tubal ligation    . Endometrial ablation    . Colonoscopy  06/06/2010    [id number] bleeding secondary to internal hemorrhoids but incomplete evaluation secondary to poor right colon prep/small rectal and sigmoid colon polyps (hyperplastic). PROPOFOL  . Esophagogastroduodenoscopy  06/06/2010    [id number] erythema and edema of body of stomach, with sessile polypoid lesions. bx benign. no h.pylori  . Colonoscopy  May 2013    Dr. Gilliam/[facility]: 5 mm a descending colon polyp, hyperplastic. Adequate bowel prep.  . Flexible sigmoidoscopy  N/A 05/17/2013    Procedure: FLEXIBLE SIGMOIDOSCOPY WITH PROPOFOL;  Surgeon: [name], MD;  Location: [facility];  Service: Endoscopy;  Laterality: N/A;  . Esophagogastroduodenoscopy (egd) with propofol N/A 05/17/2013    Procedure: ESOPHAGOGASTRODUODENOSCOPY (EGD) WITH PROPOFOL;  Surgeon: [name], MD;  Location: [facility];  Service: Endoscopy;  Laterality: N/A;  g junction is 42 cm  . Savory dilation N/A 05/17/2013    Procedure: SAVORY DILATION;  Surgeon: [name], MD;  Location: [facility];  Service: Endoscopy;  Laterality: N/A;  14/15/16  . Hemorrhoid banding N/A 05/17/2013    Procedure: HEMORRHOID BANDING;  Surgeon: [name], MD;  Location: [facility];  Service: Endoscopy;  Laterality: N/A;  2 bands placed  . Esophageal biopsy N/A 05/17/2013    Procedure: BIOPSY;  Surgeon: [name], MD;  Location: [facility];  Service: Endoscopy;  Laterality: N/A;  . Irrigation and debridement abscess Right 06/01/2013    Procedure: INCISION AND DRAINAGE AND DEBRIDEMENT ABSCESS RIGHT BREAST;  Surgeon: [name], MD;  Location: [facility];  Service: General;  Laterality: Right;  . Incision and drainage abscess Left 10/11/2013    Procedure: INCISION AND DRAINAGE AND DEBRIDEMENT LEFT BREAST  ABSCESS;  Surgeon: [name], MD;  Location: [facility];  Service: General;  Laterality: Left;   Family History  Problem Relation Age of Onset  . Adopted: Yes   History  Substance Use Topics  . Smoking status:  Former Smoker -- 1.50 packs/day for 25 years    Types: Cigarettes    Quit date: 10/23/2007  . Smokeless tobacco: Former Systems developer  . Alcohol Use: No   OB History   Grav Para Term Preterm Abortions TAB SAB Ect Mult Living   2 2 1 1      2      Review of Systems  Constitutional: Negative for fever and chills.  Gastrointestinal: Positive for nausea and vomiting. Negative for diarrhea.  Genitourinary: Positive for dysuria.  All other systems reviewed and are negative.     Allergies   Codeine  Home Medications   Prior to Admission medications   Medication Sig Start Date End Date Taking? Authorizing Provider  albuterol (PROAIR HFA) 108 (90 BASE) MCG/ACT inhaler Inhale 2 puffs into the lungs every 6 (six) hours as needed for wheezing or shortness of breath.   Yes Historical Provider, MD  albuterol (PROVENTIL) (2.5 MG/3ML) 0.083% nebulizer solution Take 2.5 mg by nebulization 4 (four) times daily.    Yes Historical Provider, MD  ALPRAZolam Duanne Moron) 1 MG tablet Take 1 mg by mouth 4 (four) times daily.     Yes Historical Provider, MD  amLODipine (NORVASC) 10 MG tablet Take 10 mg by mouth daily.   Yes Historical Provider, MD  CINNAMON PO Take 1 capsule by mouth daily.   Yes Historical Provider, MD  HYDROcodone-acetaminophen (NORCO) 10-325 MG per tablet Take 1 tablet by mouth every 6 (six) hours as needed for pain.   Yes Historical Provider, MD  Insulin Aspart Prot & Aspart (NOVOLOG MIX 70/30 FLEXPEN) (70-30) 100 UNIT/ML Pen Inject 60 Units into the skin 2 (two) times daily.    Yes Historical Provider, MD  lisinopril-hydrochlorothiazide (PRINZIDE,ZESTORETIC) 20-12.5 MG per tablet Take 1 tablet by mouth 2 (two) times daily.    Yes Historical Provider, MD  metFORMIN (GLUCOPHAGE) 1000 MG tablet Take 1,000 mg by mouth 2 (two) times daily with a meal.   Yes Historical Provider, MD  omeprazole (PRILOSEC) 20 MG capsule Take 20 mg by mouth 2 (two) times daily.    Yes Historical Provider, MD  promethazine (PHENERGAN) 25 MG tablet Take 1 tablet (25 mg total) by mouth every 6 (six) hours as needed for nausea or vomiting. 02/03/14  Yes [name], MD  rOPINIRole (REQUIP) 1 MG tablet Take 1 mg by mouth at bedtime.    Yes Historical Provider, MD  simvastatin (ZOCOR) 40 MG tablet Take 40 mg by mouth every evening.   Yes Historical Provider, MD  UNKNOWN TO PATIENT Apply 1 application topically daily as needed (for irritation (vaginal area) COMPOUND MEDICATION: Ketoconazole mixed with another  medication  (name is unknown)).   Yes Historical Provider, MD  HYDROcodone-acetaminophen (NORCO/VICODIN) 5-325 MG per tablet Take 2 tablets by mouth every 4 (four) hours as needed for moderate pain. 02/05/14   Johnna Acosta, MD  potassium chloride SA (K-DUR,KLOR-CON) 20 MEQ tablet Take 1 tablet (20 mEq total) by mouth 2 (two) times daily. 02/05/14   Johnna Acosta, MD  Pseudoephedrine-Ibuprofen (ADVIL COLD/SINUS PO) Take 1 tablet by mouth as needed (sinus headache).    Historical Provider, MD   Triage Vitals: BP 186/117  Pulse 91  Temp(Src) 97.6 F (36.4 C) (Oral)  Resp 24  Ht 5\' 7"  (1.702 m)  Wt 350 lb (158.759 kg)  BMI 54.80 kg/m2  SpO2 95% Physical Exam  Nursing note and vitals reviewed. Constitutional: She is oriented to person, place, and time. She appears well-developed and well-nourished.  No distress.  HENT:  Head: Normocephalic and atraumatic.  Mouth/Throat: Oropharynx is clear and moist. No oropharyngeal exudate.  Mucous membranes are dry.   Eyes: Conjunctivae and EOM are normal. Pupils are equal, round, and reactive to light. Right eye exhibits no discharge. Left eye exhibits no discharge. No scleral icterus.  Neck: Normal range of motion. Neck supple. No JVD present. No tracheal deviation present. No thyromegaly present.  Cardiovascular: Normal rate, regular rhythm, normal heart sounds and intact distal pulses.  Exam reveals no gallop and no friction rub.   No murmur heard. Pulmonary/Chest: Effort normal and breath sounds normal. No respiratory distress. She has no wheezes. She has no rales.  Abdominal: Soft. Bowel sounds are normal. She exhibits no distension and no mass. There is tenderness. There is no guarding.  Diffuse mild abdominal tenderness without guarding.   Mobese with no peritoneal signs.   Musculoskeletal: Normal range of motion. She exhibits no edema and no tenderness.  Lymphadenopathy:    She has no cervical adenopathy.  Neurological: She is alert and  oriented to person, place, and time. Coordination normal.  Skin: Skin is warm and dry. No rash noted. No erythema.  Psychiatric: She has a normal mood and affect. Her behavior is normal.    ED Course  Procedures (including critical care time) DIAGNOSTIC STUDIES: Oxygen Saturation is 95% on room air, normal by my interpretation.    COORDINATION OF CARE: 11:23 PM-Hypertensive. Hasn't taken medicines. Doses ordered here. Rule out DKA. Hydrate, pain control.    Labs Review Labs Reviewed  URINALYSIS, ROUTINE W REFLEX MICROSCOPIC - Abnormal; Notable for the following:    APPearance CLOUDY (*)    Glucose, UA 500 (*)    Hgb urine dipstick TRACE (*)    Protein, ur >300 (*)    All other components within normal limits  URINE MICROSCOPIC-ADD ON - Abnormal; Notable for the following:    Squamous Epithelial / LPF FEW (*)    Bacteria, UA MANY (*)    All other components within normal limits  BASIC METABOLIC PANEL - Abnormal; Notable for the following:    Potassium 3.1 (*)    Glucose, Bld 175 (*)    Creatinine, Ser 1.32 (*)    GFR calc non Af Amer 48 (*)    GFR calc Af Amer 55 (*)    All other components within normal limits  CBG MONITORING, ED - Abnormal; Notable for the following:    Glucose-Capillary 191 (*)    All other components within normal limits  CBC WITH DIFFERENTIAL  LACTIC ACID, PLASMA    Imaging Review Ct Abdomen Pelvis W Contrast  02/03/2014   CLINICAL DATA:  LEFT lower quadrant pain, nausea, fever, question diverticulitis or hernia, past history diabetes, hypertension, COPD, and anemia, smoking  EXAM: CT ABDOMEN AND PELVIS WITH CONTRAST  TECHNIQUE: Multidetector CT imaging of the abdomen and pelvis was performed using the standard protocol following bolus administration of intravenous contrast. Sagittal and coronal MPR images reconstructed from axial data set.  CONTRAST:  173mL OMNIPAQUE IOHEXOL 300 MG/ML SOLN IV, 65mL OMNIPAQUE IOHEXOL 300 MG/ML SOLN ORALLY  COMPARISON:   03/14/2013  FINDINGS: Lung bases clear.  Calcified 2.3 cm diameter gallstone within gallbladder.  Liver, spleen, pancreas, kidneys, and adrenal glands normal.  Unremarkable bladder, ureters, uterus, and adnexae.  Normal appendix.  Stomach and bowel loops unremarkable.  Specifically no evidence of colonic diverticula or wall thickening to suggest acute diverticulitis.  No mass, adenopathy, free fluid, free air or inflammatory  process.  No hernia or acute bone lesions.  IMPRESSION: Cholelithiasis.  Otherwise negative exam.   Electronically Signed   By: Lavonia Dana M.D.   On: 02/03/2014 16:13      MDM   Final diagnoses:  Generalized abdominal pain  Hyperglycemia  Renal insufficiency  Hypokalemia    Ongoing abd pain - w/u yesterday neg for acute findings - today has normal CBC, much improved CBG.  UA with normal SG and no ketones.  She has proteinuria - BMP pending at this time, lactic pending - fluids ordered, insulin held given improved CBG.  Labs with mild renal insuff - pt informed - CBG < 200, no DKA, no other acute findings - pt ambulatory and im p;roved after meds, will d/c home for further outp tw/u.    Meds given in ED:  Medications  sodium chloride 0.9 % bolus 1,000 mL (0 mLs Intravenous Stopped 02/05/14 0019)  morphine 4 MG/ML injection 4 mg (4 mg Intravenous Given 02/04/14 2343)  ondansetron (ZOFRAN) injection 4 mg (4 mg Intravenous Given 02/04/14 2343)  amLODipine (NORVASC) tablet 10 mg (10 mg Oral Given 02/04/14 2342)  lisinopril (PRINIVIL,ZESTRIL) tablet 10 mg (10 mg Oral Given 02/04/14 2340)    New Prescriptions   HYDROCODONE-ACETAMINOPHEN (NORCO/VICODIN) 5-325 MG PER TABLET    Take 2 tablets by mouth every 4 (four) hours as needed for moderate pain.   POTASSIUM CHLORIDE SA (K-DUR,KLOR-CON) 20 MEQ TABLET    Take 1 tablet (20 mEq total) by mouth 2 (two) times daily.      I personally performed the services described in this documentation, which was scribed in my presence. The  recorded information has been reviewed and is accurate.      Johnna Acosta, MD 02/05/14 [phone]

## 2014-02-05 LAB — LACTIC ACID, PLASMA: Lactic Acid, Venous: 1.3 mmol/L (ref 0.5–2.2)

## 2014-02-05 LAB — CBC WITH DIFFERENTIAL/PLATELET
BASOS ABS: 0.1 10*3/uL (ref 0.0–0.1)
Basophils Relative: 1 % (ref 0–1)
Eosinophils Absolute: 0.3 10*3/uL (ref 0.0–0.7)
Eosinophils Relative: 4 % (ref 0–5)
HEMATOCRIT: 40.4 % (ref 36.0–46.0)
Hemoglobin: 14 g/dL (ref 12.0–15.0)
LYMPHS PCT: 42 % (ref 12–46)
Lymphs Abs: 2.6 10*3/uL (ref 0.7–4.0)
MCH: 28.1 pg (ref 26.0–34.0)
MCHC: 34.7 g/dL (ref 30.0–36.0)
MCV: 81.1 fL (ref 78.0–100.0)
MONO ABS: 0.4 10*3/uL (ref 0.1–1.0)
MONOS PCT: 6 % (ref 3–12)
Neutro Abs: 2.9 10*3/uL (ref 1.7–7.7)
Neutrophils Relative %: 47 % (ref 43–77)
Platelets: 223 10*3/uL (ref 150–400)
RBC: 4.98 MIL/uL (ref 3.87–5.11)
RDW: 13.7 % (ref 11.5–15.5)
WBC: 6.3 10*3/uL (ref 4.0–10.5)

## 2014-02-05 LAB — BASIC METABOLIC PANEL
Anion gap: 11 (ref 5–15)
BUN: 12 mg/dL (ref 6–23)
CHLORIDE: 103 meq/L (ref 96–112)
CO2: 27 meq/L (ref 19–32)
Calcium: 8.7 mg/dL (ref 8.4–10.5)
Creatinine, Ser: 1.32 mg/dL — ABNORMAL HIGH (ref 0.50–1.10)
GFR calc non Af Amer: 48 mL/min — ABNORMAL LOW (ref >90)
GFR, EST AFRICAN AMERICAN: 55 mL/min — AB (ref >90)
GLUCOSE: 175 mg/dL — AB (ref 70–99)
POTASSIUM: 3.1 meq/L — AB (ref 3.7–5.3)
Sodium: 141 mEq/L (ref 137–147)

## 2014-02-05 MED ORDER — POTASSIUM CHLORIDE CRYS ER 20 MEQ PO TBCR: 20.0000 meq | EXTENDED_RELEASE_TABLET | Freq: Two times a day (BID) | ORAL | Status: DC

## 2014-02-05 MED ORDER — HYDROCODONE-ACETAMINOPHEN 5-325 MG PO TABS: 2.0000 | ORAL_TABLET | ORAL | Status: DC | PRN

## 2014-02-05 NOTE — Discharge Instructions (Signed)
Your testing has shown that you have some kidney trouble compared to normal for you - they are still working but not at 100%.  They need to be rechecked at your doctors office in one week.  Your potassium was slightly low - take the medicine as prescribed.  Your CT scan yesterday showed no other ACUTE problems - you do have a gall stone but it is not likely causing your symptoms.  Return to the ER for severe or worsening symtpoms including pain or vomiting not controlled with medications.

## 2014-02-09 ENCOUNTER — Encounter: Payer: Self-pay | Admitting: Gastroenterology

## 2014-02-09 ENCOUNTER — Ambulatory Visit (INDEPENDENT_AMBULATORY_CARE_PROVIDER_SITE_OTHER): Payer: Medicaid Other | Admitting: Gastroenterology

## 2014-02-09 ENCOUNTER — Encounter (INDEPENDENT_AMBULATORY_CARE_PROVIDER_SITE_OTHER): Payer: Self-pay

## 2014-02-09 VITALS — BP 173/104 | HR 85 | Temp 97.8°F | Ht 67.0 in | Wt 345.2 lb

## 2014-02-09 DIAGNOSIS — R195 Other fecal abnormalities: Secondary | ICD-10-CM

## 2014-02-09 MED ORDER — PROMETHAZINE HCL 25 MG PO TABS: 25.0000 mg | ORAL_TABLET | Freq: Four times a day (QID) | ORAL | Status: DC | PRN

## 2014-02-09 MED ORDER — METRONIDAZOLE 500 MG PO TABS: 500.0000 mg | ORAL_TABLET | Freq: Three times a day (TID) | ORAL | Status: DC

## 2014-02-09 MED ORDER — DICYCLOMINE HCL 10 MG PO CAPS: 10.0000 mg | ORAL_CAPSULE | Freq: Three times a day (TID) | ORAL | Status: DC

## 2014-02-09 NOTE — Patient Instructions (Signed)
I sent a nausea medication to your pharmacy to take once every 6 hours as needed. This causes drowsiness, so take only as needed.  I sent an antibiotic called Flagyl to your pharmacy to treat any possible infection not caught by CT. I also want to get stool samples in case we are dealing with a bacterial infection.  Bentyl was also called into your pharmacy to take with meals and at bedtime for cramping and loose stool.  Follow a low residue diet until symptoms start improving.   Low-Fiber Diet Fiber is found in fruits, vegetables, and whole grains. A low-fiber diet restricts fibrous foods that are not digested in the small intestine. A diet containing about 10-15 grams of fiber per day is considered low fiber. Low-fiber diets may be used to:  Promote healing and rest the bowel during intestinal flare-ups.  Prevent blockage of a partially obstructed or narrowed gastrointestinal tract.  Reduce fecal weight and volume.  Slow the movement of feces. You may be on a low-fiber diet as a transitional diet following surgery, after an injury (trauma), or because of a short (acute) or lifelong (chronic) illness. Your health care provider will determine the length of time you need to stay on this diet.  WHAT DO I NEED TO KNOW ABOUT A LOW-FIBER DIET? Always check the fiber content on the packaging's Nutrition Facts label, especially on foods from the grains list. Ask your dietitian if you have questions about specific foods that are related to your condition, especially if the food is not listed below. In general, a low-fiber food will have less than 2 g of fiber. WHAT FOODS CAN I EAT? Grains All breads and crackers made with white flour. Sweet rolls, doughnuts, waffles, pancakes, [place] toast, bagels. Pretzels, Melba toast, zwieback. Well-cooked cereals, such as cornmeal, farina, or cream cereals. Dry cereals that do not contain whole grains, fruit, or nuts, such as refined corn, wheat, rice, and oat  cereals. Potatoes prepared any way without skins, plain pastas and noodles, refined white rice. Use white flour for baking and making sauces. Use allowed list of grains for casseroles, dumplings, and puddings.  Vegetables Strained tomato and vegetable juices. Fresh lettuce, cucumber, spinach. Well-cooked (no skin or pulp) or canned vegetables, such as asparagus, bean sprouts, beets, carrots, green beans, mushrooms, potatoes, pumpkin, spinach, yellow squash, tomato sauce/puree, turnips, yams, and zucchini. Keep servings limited to  cup.  Fruits All fruit juices except prune juice. Cooked or canned fruits without skin and seeds, such as applesauce, apricots, cherries, fruit cocktail, grapefruit, grapes, mandarin oranges, melons, peaches, pears, pineapple, and plums. Fresh fruits without skin, such as apricots, avocados, bananas, melons, pineapple, nectarines, and peaches. Keep servings limited to  cup or 1 piece.  Meat and Other Protein Sources Ground or well-cooked tender beef, ham, veal, lamb, pork, or poultry. Eggs, plain cheese. Fish, oysters, shrimp, lobster, and other seafood. Liver, organ meats. Smooth nut butters. Dairy All milk products and alternative dairy substitutes, such as soy, rice, almond, and coconut, not containing added whole nuts, seeds, or added fruit. Beverages Decaf coffee, fruit, and vegetable juices or smoothies (small amounts, with no pulp or skins, and with fruits from allowed list), sports drinks, herbal tea. Condiments Ketchup, mustard, vinegar, cream sauce, cheese sauce, cocoa powder. Spices in moderation, such as allspice, basil, bay leaves, celery powder or leaves, cinnamon, cumin powder, curry powder, ginger, mace, marjoram, onion or garlic powder, oregano, paprika, parsley flakes, ground pepper, rosemary, sage, savory, tarragon, thyme, and turmeric. Sweets  and Desserts Plain cakes and cookies, pie made with allowed fruit, pudding, custard, cream pie. Gelatin, fruit,  ice, sherbet, frozen ice pops. Ice cream, ice milk without nuts. Plain hard candy, honey, jelly, molasses, syrup, sugar, chocolate syrup, gumdrops, marshmallows. Limit overall sugar intake.  Fats and Oil Margarine, butter, cream, mayonnaise, salad oils, plain salad dressings made from allowed foods. Choose healthy fats such as olive oil, canola oil, and omega-3 fatty acids (such as found in salmon or tuna) when possible.  Other Bouillon, broth, or cream soups made from allowed foods. Any strained soup. Casseroles or mixed dishes made with allowed foods. The items listed above may not be a complete list of recommended foods or beverages. Contact your dietitian for more options.  WHAT FOODS ARE NOT RECOMMENDED? Grains All whole wheat and whole grain breads and crackers. Multigrains, rye, bran seeds, nuts, or coconut. Cereals containing whole grains, multigrains, bran, coconut, nuts, raisins. Cooked or dry oatmeal, steel-cut oats. Coarse wheat cereals, granola. Cereals advertised as high fiber. Potato skins. Whole grain pasta, wild or brown rice. Popcorn. Coconut flour. Bran, buckwheat, corn bread, multigrains, rye, wheat germ.  Vegetables Fresh, cooked or canned vegetables, such as artichokes, asparagus, beet greens, broccoli, Brussels sprouts, cabbage, celery, cauliflower, corn, eggplant, kale, legumes or beans, okra, peas, and tomatoes. Avoid large servings of any vegetables, especially raw vegetables.  Fruits Fresh fruits, such as apples with or without skin, berries, cherries, figs, grapes, grapefruit, guavas, kiwis, mangoes, oranges, papayas, pears, persimmons, pineapple, and pomegranate. Prune juice and juices with pulp, stewed or dried prunes. Dried fruits, dates, raisins. Fruit seeds or skins. Avoid large servings of all fresh fruits. Meats and Other Protein Sources Tough, fibrous meats with gristle. Chunky nut butter. Cheese made with seeds, nuts, or other foods not recommended. Nuts, seeds,  legumes (beans, including baked beans), dried peas, beans, lentils.  Dairy Yogurt or cheese that contains nuts, seeds, or added fruit.  Beverages Fruit juices with high pulp, prune juice. Caffeinated coffee and teas.  Condiments Coconut, maple syrup, pickles, olives. Sweets and Desserts Desserts, cookies, or candies that contain nuts or coconut, chunky peanut butter, dried fruits. Jams, preserves with seeds, marmalade. Large amounts of sugar and sweets. Any other dessert made with fruits from the not recommended list.  Other Soups made from vegetables that are not recommended or that contain other foods not recommended.  The items listed above may not be a complete list of foods and beverages to avoid. Contact your dietitian for more information. Document Released: 01/03/2002 Document Revised: 07/19/2013 Document Reviewed: 06/06/2013 [facility] Patient Information 2015 [place], [place]. This information is not intended to replace advice given to you by your health care provider. Make sure you discuss any questions you have with your health care provider.

## 2014-02-09 NOTE — Assessment & Plan Note (Signed)
48 year old female with significant change in bowel habits to include 3-4 non-bloody loose stools daily, significant LLQ pain, and with Tmax of only 99. Labs unrevealing, CT without overt evidence for diverticulitis. Cholelithiasis noted but pain is not congruent with biliary origin. With change in bowel habits, will order Cdiff, stool culture, and Giardia to assess for infectious process. Will empirically add Flagyl in interim. Supportive measures to include Phenergan and Bentyl. Although no evidence of diverticulitis on CT, she does have known diverticulosis and significant LLQ discomfort. Low-residue diet until symptoms improve. Patient to call if no improvement. EGD and lower GI evaluation up-to-date as mentioned in medical history.

## 2014-02-09 NOTE — Progress Notes (Signed)
Referring Provider: Alonza Bogus, MD Primary Care Physician:  Alonza Bogus, MD Primary GI: Dr. Oneida Alar   CC: Abdominal pain, N/V  HPI:   Martha [name] presents today as an urgent work-in secondary to abdominal pain, N/V, and diarrhea. Notes 3 weeks history of LLQ discomfort only relieved with pain medication. Associated loose stools 3-4 times per day, non-bloody. No precipitating or relieving factors. Prior to this would have 1 BM twice a day. No recent antibiotic exposure, sick contacts. No well water. Associated N/V intermittently. However, she reports eating salad and a few slices of pizza yesterday. This aggravated her symptoms. Max temp 99.0. Seen in ED several times over past week. CT with cholelithiasis otherwise negative. CBC normal, lactic acid normal. Lipase and LFTs normal.   Past Medical History  Diagnosis Date  . Diabetes mellitus   . Hypertension   . RLS (restless legs syndrome)   . Acid reflux   . Bipolar 1 disorder   . Depression   . OSA (obstructive sleep apnea)     cpap  . Anemia   . COPD (chronic obstructive pulmonary disease)     bronchitis  . Arthritis   . Degenerative disc disease, thoracic     Past Surgical History  Procedure Laterality Date  . Tubal ligation    . Endometrial ablation    . Colonoscopy  06/06/2010    [id number] bleeding secondary to internal hemorrhoids but incomplete evaluation secondary to poor right colon prep/small rectal and sigmoid colon polyps (hyperplastic). PROPOFOL  . Esophagogastroduodenoscopy  06/06/2010    [id number] erythema and edema of body of stomach, with sessile polypoid lesions. bx benign. no h.pylori  . Colonoscopy  May 2013    Dr. Gilliam/[facility]: 5 mm a descending colon polyp, hyperplastic. Adequate bowel prep.  . Flexible sigmoidoscopy N/A 05/17/2013    Dr. Oneida Alar: moderate sized internal hemorrhoids  . Esophagogastroduodenoscopy (egd) with propofol N/A 05/17/2013    Dr. Oneida Alar: normal esophagus,  moderate nodular gastritis, negative path, empiric Savary dilation  . Savory dilation N/A 05/17/2013    Procedure: SAVORY DILATION;  Surgeon: [name], MD;  Location: [facility];  Service: Endoscopy;  Laterality: N/A;  14/15/16  . Hemorrhoid banding N/A 05/17/2013    Procedure: HEMORRHOID BANDING;  Surgeon: [name], MD;  Location: [facility];  Service: Endoscopy;  Laterality: N/A;  2 bands placed  . Esophageal biopsy N/A 05/17/2013    Procedure: BIOPSY;  Surgeon: [name], MD;  Location: [facility];  Service: Endoscopy;  Laterality: N/A;  . Irrigation and debridement abscess Right 06/01/2013    Procedure: INCISION AND DRAINAGE AND DEBRIDEMENT ABSCESS RIGHT BREAST;  Surgeon: [name], MD;  Location: [facility];  Service: General;  Laterality: Right;  . Incision and drainage abscess Left 10/11/2013    Procedure: INCISION AND DRAINAGE AND DEBRIDEMENT LEFT BREAST  ABSCESS;  Surgeon: [name], MD;  Location: [facility];  Service: General;  Laterality: Left;    Current Outpatient Prescriptions  Medication Sig Dispense Refill  . albuterol (PROAIR HFA) 108 (90 BASE) MCG/ACT inhaler Inhale 2 puffs into the lungs every 6 (six) hours as needed for wheezing or shortness of breath.      Marland Kitchen albuterol (PROVENTIL) (2.5 MG/3ML) 0.083% nebulizer solution Take 2.5 mg by nebulization 4 (four) times daily.       Marland Kitchen ALPRAZolam (XANAX) 1 MG tablet Take 1 mg by mouth 4 (four) times daily.        Marland Kitchen amLODipine (NORVASC)  10 MG tablet Take 10 mg by mouth daily.      Marland Kitchen CINNAMON PO Take 1 capsule by mouth daily.      Marland Kitchen HYDROcodone-acetaminophen (NORCO/VICODIN) 5-325 MG per tablet Take 2 tablets by mouth every 4 (four) hours as needed for moderate pain.  6 tablet  0  . Insulin Aspart Prot & Aspart (NOVOLOG MIX 70/30 FLEXPEN) (70-30) 100 UNIT/ML Pen Inject 60 Units into the skin 2 (two) times daily.       Marland Kitchen lisinopril-hydrochlorothiazide (PRINZIDE,ZESTORETIC) 20-12.5 MG per tablet Take 1 tablet by mouth 2 (two)  times daily.       . metFORMIN (GLUCOPHAGE) 1000 MG tablet Take 1,000 mg by mouth 2 (two) times daily with a meal.      . omeprazole (PRILOSEC) 20 MG capsule Take 20 mg by mouth 2 (two) times daily.       . potassium chloride SA (K-DUR,KLOR-CON) 20 MEQ tablet Take 1 tablet (20 mEq total) by mouth 2 (two) times daily.  6 tablet  0  . Pseudoephedrine-Ibuprofen (ADVIL COLD/SINUS PO) Take 1 tablet by mouth as needed (sinus headache).      Marland Kitchen rOPINIRole (REQUIP) 1 MG tablet Take 1 mg by mouth at bedtime.       . simvastatin (ZOCOR) 40 MG tablet Take 40 mg by mouth every evening.      Marland Kitchen UNKNOWN TO PATIENT Apply 1 application topically daily as needed (for irritation (vaginal area) COMPOUND MEDICATION: Ketoconazole mixed with another medication  (name is unknown)).      . dicyclomine (BENTYL) 10 MG capsule Take 1 capsule (10 mg total) by mouth 4 (four) times daily -  before meals and at bedtime.  120 capsule  3  . HYDROcodone-acetaminophen (NORCO) 10-325 MG per tablet Take 1 tablet by mouth every 6 (six) hours as needed for pain.      . metroNIDAZOLE (FLAGYL) 500 MG tablet Take 1 tablet (500 mg total) by mouth 3 (three) times daily.  21 tablet  0  . promethazine (PHENERGAN) 25 MG tablet Take 1 tablet (25 mg total) by mouth every 6 (six) hours as needed for nausea or vomiting.  30 tablet  0   No current facility-administered medications for this visit.    Allergies as of 02/09/2014 - Review Complete 02/09/2014  Allergen Reaction Noted  . Codeine Nausea And Vomiting     Family History  Problem Relation Age of Onset  . Adopted: Yes    History   Social History  . Marital Status: Divorced    Spouse Name: N/A    Number of Children: N/A  . Years of Education: N/A   Social History Main Topics  . Smoking status: Former Smoker -- 1.50 packs/day for 25 years    Types: Cigarettes    Quit date: 10/23/2007  . Smokeless tobacco: Former Systems developer     Comment: Quit x 6 years ago  . Alcohol Use: No  . Drug  Use: No  . Sexual Activity: Yes    Birth Control/ Protection: Surgical   Other Topics Concern  . None   Social History Narrative  . None    Review of Systems: As mentioned in HPI.   Physical Exam: BP 173/104  Pulse 85  Temp(Src) 97.8 F (36.6 C) (Oral)  Ht 5\' 7"  (1.702 m)  Wt 345 lb 3.2 oz (156.582 kg)  BMI 54.05 kg/m2 General:   Alert and oriented. No distress noted. Pleasant and cooperative.  Head:  Normocephalic and atraumatic. Eyes:  Conjuctiva clear without scleral icterus. Mouth:  Oral mucosa pink and moist. Good dentition. No lesions. Heart:  S1, S2 present without murmurs, rubs, or gallops. Regular rate and rhythm. Abdomen:  +BS, soft, TTP to left of umbilicus and LLQ, and non-distended. Obese. No rebound or guarding Msk:  Symmetrical without gross deformities. Normal posture. Extremities:  Without edema. Neurologic:  Alert and  oriented x4;  grossly normal neurologically. Skin:  Intact without significant lesions or rashes. Psych:  Alert and cooperative. Normal mood and affect.  Lab Results  Component Value Date   WBC 6.3 02/05/2014   HGB 14.0 02/05/2014   HCT 40.4 02/05/2014   MCV 81.1 02/05/2014   PLT 223 02/05/2014   Lab Results  Component Value Date   ALT 12 02/03/2014   AST 9 02/03/2014   ALKPHOS 88 02/03/2014   BILITOT 0.2* 02/03/2014   Lab Results  Component Value Date   CREATININE 1.32* 02/05/2014   BUN 12 02/05/2014   NA 141 02/05/2014   K 3.1* 02/05/2014   CL 103 02/05/2014   CO2 27 02/05/2014   Lab Results  Component Value Date   LIPASE 35 02/03/2014

## 2014-02-11 LAB — CLOSTRIDIUM DIFFICILE BY PCR: CDIFFPCR: NOT DETECTED

## 2014-02-13 ENCOUNTER — Telehealth: Payer: Self-pay

## 2014-02-13 LAB — GIARDIA ANTIGEN: GIARDIA SCREEN (EIA): NEGATIVE

## 2014-02-13 MED FILL — Hydrocodone-Acetaminophen Tab 5-325 MG: ORAL | Qty: 6 | Status: AC

## 2014-02-13 NOTE — Telephone Encounter (Signed)
Pt left VM asking for results of labs. Said she is still hurting right bad. I tried to call and got VM, LMOM for a return call.

## 2014-02-14 ENCOUNTER — Telehealth: Payer: Self-pay | Admitting: Gastroenterology

## 2014-02-14 LAB — STOOL CULTURE

## 2014-02-14 NOTE — Telephone Encounter (Signed)
LMOM to call.

## 2014-02-14 NOTE — Telephone Encounter (Signed)
Please call patient back at [phone]. She was returning call to DS

## 2014-02-14 NOTE — Telephone Encounter (Signed)
Pt called and said that she is still having intermittent pain in her abdomen and left side. It hurts mostly when she is trying to sleep.  I did tell her that her stools were negative and I will check with Laban Emperor, NP about her plan of care.

## 2014-02-15 MED ORDER — HYOSCYAMINE SULFATE 0.125 MG SL SUBL: 0.1250 mg | SUBLINGUAL_TABLET | SUBLINGUAL | Status: DC | PRN

## 2014-02-15 NOTE — Telephone Encounter (Signed)
I have sent in Levsin to her pharmacy. Is diarrhea better?  Stool studies, CT, all unrevealing.  Sounds more of a musculoskeletal component; is it worse with movement?

## 2014-02-16 ENCOUNTER — Telehealth: Payer: Self-pay | Admitting: Gastroenterology

## 2014-02-16 NOTE — Telephone Encounter (Signed)
Pt aware of results 

## 2014-02-16 NOTE — Telephone Encounter (Signed)
Pt was returning call to [place]. [phone]

## 2014-02-16 NOTE — Telephone Encounter (Signed)
Pt called and was informed. She will get the Levsin. She said the diarrhea is better and the pain is a little worse when she moves around.

## 2014-02-16 NOTE — Telephone Encounter (Signed)
I feel she needs to see her PCP.  May have a musculoskeletal etiology.

## 2014-02-16 NOTE — Telephone Encounter (Signed)
LMOM to call.

## 2014-02-17 NOTE — Telephone Encounter (Signed)
LMOM to call.

## 2014-02-20 ENCOUNTER — Telehealth: Payer: Self-pay | Admitting: Gastroenterology

## 2014-02-20 NOTE — Telephone Encounter (Signed)
Pt returned call and was informed to see her PCP. She also wanted me to tell Laban Emperor, NP that she has a knot that comes up on her stomach sometimes and it disappears. She would like to know what Vicente Males thinks about that.

## 2014-02-20 NOTE — Telephone Encounter (Signed)
Tried to call pt. LMOM for a return call. ( See note of 02/13/2014 for the information for pt. ).

## 2014-02-20 NOTE — Telephone Encounter (Signed)
Pt LMOM several times during lunch that she was returning call to someone that had tried calling her on Friday.

## 2014-02-21 NOTE — Progress Notes (Signed)
cc'd to pcp 

## 2014-02-23 ENCOUNTER — Telehealth: Payer: Self-pay | Admitting: Gastroenterology

## 2014-02-23 NOTE — Telephone Encounter (Signed)
Where exactly is the "knot" when it appears?  Is she referring to a GYN ultrasound? I would say to make an appt with GYN for sure if she hasn't already. CT was unrevealing, but it is good to check all possibilities.  How is diarrhea?

## 2014-02-23 NOTE — Telephone Encounter (Signed)
LMOM to call.

## 2014-02-23 NOTE — Telephone Encounter (Signed)
I will review with the radiologist if there is any evidence of hernia not called on the CT.

## 2014-02-23 NOTE — Telephone Encounter (Signed)
Pt returned call. I informed her that Vicente Males was going to review with radiologist.  Pt would like to know if an [place] would be helpful, said that is the only thing that she has not had.

## 2014-02-23 NOTE — Telephone Encounter (Signed)
Patient has called back again. She said someone had tried calling her while she was laying down. I told her that I would have nurse call her back again after she finishes with the patients here.

## 2014-02-27 ENCOUNTER — Telehealth: Payer: Self-pay | Admitting: Gastroenterology

## 2014-02-27 NOTE — Telephone Encounter (Signed)
Spoke with pt. ( See note that started on 02/13/2014).

## 2014-02-27 NOTE — Telephone Encounter (Signed)
PT said the "knot" appears above just above belly button. IT appears when she stands, but not when sitting. Diarrhea is much better. Regular BM's although she has to sit for awhile for the stool to come out. Please advise!

## 2014-02-27 NOTE — Telephone Encounter (Signed)
I spoke with pt. See note that started on 02/13/2014.

## 2014-02-27 NOTE — Telephone Encounter (Signed)
Pt had called on 7/30. She said that she had been laying down and missed DS call. Please call patient back.

## 2014-02-28 NOTE — Telephone Encounter (Signed)
LMOM to call.

## 2014-02-28 NOTE — Telephone Encounter (Signed)
Pt returned call and was informed. She said " the problem is that it does hurt". She said she can sit in her chair in the bathtub and the knot comes up and it does hurt. Please advise!

## 2014-02-28 NOTE — Telephone Encounter (Signed)
No evidence of hernia on CT. If not painful, would not worry.

## 2014-03-01 NOTE — Telephone Encounter (Signed)
Recommend OV with Dr. Oneida Alar.

## 2014-03-01 NOTE — Telephone Encounter (Signed)
Pt returned call and was informed. Routing to [place] to schedule.

## 2014-03-01 NOTE — Telephone Encounter (Signed)
LMOM to call.

## 2014-03-01 NOTE — Telephone Encounter (Signed)
Pt is aware of OV with SF on 9/10 at 2 and appt card mailed

## 2014-03-06 NOTE — Telephone Encounter (Signed)
Pt called stating she cannot wait until 04/06/14 at 2:00 for her appt she is having bad abd pain and side pain, per pt she has a knot on her stomach and when she lays down the pain in her side starts. Pt said she will go to the ER or PCP until the appt but she would like to know what she can do for this pain. Please advise [phone]

## 2014-03-07 NOTE — Telephone Encounter (Signed)
LATE ENTRY: I called pt yesterday and advised if her pain was bad to go to the ED. Called just now and left Vm for a return call to see how she is doing.

## 2014-03-13 NOTE — Telephone Encounter (Signed)
I called and spoke to pt. She is at Dr. Luan Pulling office now about some other issues but said that she will show Dr.Hawkins the knot. She did say that she is doing some better, sometimes a little nausea. She is aware of the appt on 04/06/2014 and will call if she needs something before then.

## 2014-03-14 NOTE — Progress Notes (Addendum)
REVIEWED. SEND PT TO SURGERY IF SHE HAS A PAINFUL KNOT  THAT COMES AND GOES.

## 2014-03-14 NOTE — Telephone Encounter (Signed)
REVIEWED. Please cal pt. She may see a Psychologist, sport and exercise if she thinks she has a hernia. She can see a Psychologist, sport and exercise in [place], or [place].

## 2014-03-15 NOTE — Progress Notes (Signed)
Noted. Please see recommendations by Dr. Oneida Alar.

## 2014-03-15 NOTE — Telephone Encounter (Signed)
LMOM to call.

## 2014-03-16 NOTE — Telephone Encounter (Signed)
I called and informed pt. She said that Dr. Luan Pulling did confirm that she has a hernia.  OK to refer to [place], Dr. Arnoldo Morale.

## 2014-03-16 NOTE — Progress Notes (Signed)
See note dated 02/13/2014. Pt is aware and Darius Bump will make the referral.

## 2014-03-17 ENCOUNTER — Other Ambulatory Visit: Payer: Self-pay | Admitting: Gastroenterology

## 2014-03-17 DIAGNOSIS — R1010 Upper abdominal pain, unspecified: Secondary | ICD-10-CM

## 2014-03-17 NOTE — Telephone Encounter (Signed)
The Referral to Dr. Arnoldo Morale needs to come from her PCP Dr. Luan Pulling because of her [place] Medicaid and I have spoken to [facility] at Dr. Luan Pulling office regarding this matter and she will refer [name] over

## 2014-03-20 ENCOUNTER — Telehealth: Payer: Self-pay | Admitting: Gastroenterology

## 2014-03-20 NOTE — Telephone Encounter (Signed)
See phone note of 02/13/2014.

## 2014-03-20 NOTE — Telephone Encounter (Signed)
Pt is aware that Dr. Luan Pulling, PCP has to do the referral to Dr. Arnoldo Morale and they are aware and working on it.

## 2014-03-20 NOTE — Telephone Encounter (Signed)
Spoke to patient and let her know her appoint with the surgeon was in the process of being set up and someone would be in contact with her.

## 2014-03-23 ENCOUNTER — Other Ambulatory Visit: Payer: Self-pay | Admitting: Gastroenterology

## 2014-03-23 NOTE — Telephone Encounter (Signed)
I have Left Message again for Martha [name] at Dr. Luan Pulling office to f/u to see if referral has been made

## 2014-03-30 ENCOUNTER — Emergency Department (HOSPITAL_COMMUNITY)
Admission: EM | Admit: 2014-03-30 | Discharge: 2014-03-30 | Disposition: A | Discharge status: Home / Self Care | Payer: Medicaid Other | Attending: Emergency Medicine | Admitting: Emergency Medicine

## 2014-03-30 ENCOUNTER — Emergency Department (HOSPITAL_COMMUNITY): Payer: Medicaid Other

## 2014-03-30 ENCOUNTER — Encounter (HOSPITAL_COMMUNITY): Payer: Self-pay | Admitting: Emergency Medicine

## 2014-03-30 DIAGNOSIS — F319 Bipolar disorder, unspecified: Secondary | ICD-10-CM | POA: Diagnosis not present

## 2014-03-30 DIAGNOSIS — Z87891 Personal history of nicotine dependence: Secondary | ICD-10-CM | POA: Insufficient documentation

## 2014-03-30 DIAGNOSIS — M129 Arthropathy, unspecified: Secondary | ICD-10-CM | POA: Insufficient documentation

## 2014-03-30 DIAGNOSIS — Z794 Long term (current) use of insulin: Secondary | ICD-10-CM | POA: Insufficient documentation

## 2014-03-30 DIAGNOSIS — R1032 Left lower quadrant pain: Secondary | ICD-10-CM | POA: Insufficient documentation

## 2014-03-30 DIAGNOSIS — Z792 Long term (current) use of antibiotics: Secondary | ICD-10-CM | POA: Diagnosis not present

## 2014-03-30 DIAGNOSIS — J4489 Other specified chronic obstructive pulmonary disease: Secondary | ICD-10-CM | POA: Insufficient documentation

## 2014-03-30 DIAGNOSIS — Z862 Personal history of diseases of the blood and blood-forming organs and certain disorders involving the immune mechanism: Secondary | ICD-10-CM | POA: Insufficient documentation

## 2014-03-30 DIAGNOSIS — I10 Essential (primary) hypertension: Secondary | ICD-10-CM | POA: Diagnosis not present

## 2014-03-30 DIAGNOSIS — Z79899 Other long term (current) drug therapy: Secondary | ICD-10-CM | POA: Diagnosis not present

## 2014-03-30 DIAGNOSIS — G4733 Obstructive sleep apnea (adult) (pediatric): Secondary | ICD-10-CM | POA: Diagnosis not present

## 2014-03-30 DIAGNOSIS — R1011 Right upper quadrant pain: Secondary | ICD-10-CM

## 2014-03-30 DIAGNOSIS — G2581 Restless legs syndrome: Secondary | ICD-10-CM | POA: Insufficient documentation

## 2014-03-30 DIAGNOSIS — K458 Other specified abdominal hernia without obstruction or gangrene: Secondary | ICD-10-CM | POA: Insufficient documentation

## 2014-03-30 DIAGNOSIS — J449 Chronic obstructive pulmonary disease, unspecified: Secondary | ICD-10-CM | POA: Diagnosis not present

## 2014-03-30 DIAGNOSIS — K8020 Calculus of gallbladder without cholecystitis without obstruction: Secondary | ICD-10-CM | POA: Diagnosis not present

## 2014-03-30 DIAGNOSIS — Z9981 Dependence on supplemental oxygen: Secondary | ICD-10-CM | POA: Insufficient documentation

## 2014-03-30 DIAGNOSIS — E119 Type 2 diabetes mellitus without complications: Secondary | ICD-10-CM | POA: Diagnosis not present

## 2014-03-30 DIAGNOSIS — Z9889 Other specified postprocedural states: Secondary | ICD-10-CM | POA: Insufficient documentation

## 2014-03-30 DIAGNOSIS — K219 Gastro-esophageal reflux disease without esophagitis: Secondary | ICD-10-CM | POA: Insufficient documentation

## 2014-03-30 LAB — URINALYSIS, ROUTINE W REFLEX MICROSCOPIC
Bilirubin Urine: NEGATIVE
Glucose, UA: >1000 mg/dL — AB
Ketones, ur: NEGATIVE mg/dL
LEUKOCYTES UA: NEGATIVE
Nitrite: NEGATIVE
PH: 6 (ref 5.0–8.0)
Protein, ur: 100 mg/dL — AB
SPECIFIC GRAVITY, URINE: 1.02 (ref 1.005–1.030)
Urobilinogen, UA: 0.2 mg/dL (ref 0.0–1.0)

## 2014-03-30 LAB — URINE MICROSCOPIC-ADD ON

## 2014-03-30 LAB — COMPREHENSIVE METABOLIC PANEL
ALBUMIN: 2.8 g/dL — AB (ref 3.5–5.2)
ALT: 12 U/L (ref 0–35)
AST: 9 U/L (ref 0–37)
Alkaline Phosphatase: 84 U/L (ref 39–117)
Anion gap: 14 (ref 5–15)
BUN: 24 mg/dL — ABNORMAL HIGH (ref 6–23)
CO2: 24 mEq/L (ref 19–32)
CREATININE: 1.26 mg/dL — AB (ref 0.50–1.10)
Calcium: 9.5 mg/dL (ref 8.4–10.5)
Chloride: 97 mEq/L (ref 96–112)
GFR calc Af Amer: 58 mL/min — ABNORMAL LOW (ref >90)
GFR calc non Af Amer: 50 mL/min — ABNORMAL LOW (ref >90)
Glucose, Bld: 526 mg/dL — ABNORMAL HIGH (ref 70–99)
Potassium: 4 mEq/L (ref 3.7–5.3)
SODIUM: 135 meq/L — AB (ref 137–147)
Total Bilirubin: 0.3 mg/dL (ref 0.3–1.2)
Total Protein: 6.7 g/dL (ref 6.0–8.3)

## 2014-03-30 LAB — CBC WITH DIFFERENTIAL/PLATELET
BASOS PCT: 1 % (ref 0–1)
Basophils Absolute: 0 10*3/uL (ref 0.0–0.1)
EOS PCT: 3 % (ref 0–5)
Eosinophils Absolute: 0.1 10*3/uL (ref 0.0–0.7)
HEMATOCRIT: 36.5 % (ref 36.0–46.0)
Hemoglobin: 12.8 g/dL (ref 12.0–15.0)
Lymphocytes Relative: 45 % (ref 12–46)
Lymphs Abs: 2.2 10*3/uL (ref 0.7–4.0)
MCH: 28.5 pg (ref 26.0–34.0)
MCHC: 35.1 g/dL (ref 30.0–36.0)
MCV: 81.3 fL (ref 78.0–100.0)
MONO ABS: 0.3 10*3/uL (ref 0.1–1.0)
Monocytes Relative: 5 % (ref 3–12)
NEUTROS ABS: 2.2 10*3/uL (ref 1.7–7.7)
Neutrophils Relative %: 46 % (ref 43–77)
Platelets: 190 10*3/uL (ref 150–400)
RBC: 4.49 MIL/uL (ref 3.87–5.11)
RDW: 14.1 % (ref 11.5–15.5)
WBC: 4.9 10*3/uL (ref 4.0–10.5)

## 2014-03-30 LAB — LIPASE, BLOOD: LIPASE: 79 U/L — AB (ref 11–59)

## 2014-03-30 MED ORDER — SODIUM CHLORIDE 0.9 % IV BOLUS (SEPSIS)
1000.0000 mL | Freq: Once | INTRAVENOUS | Status: AC
  Administered 2014-03-30: 1000 mL via INTRAVENOUS

## 2014-03-30 MED ORDER — ONDANSETRON HCL 4 MG/2ML IJ SOLN
4.0000 mg | Freq: Once | INTRAMUSCULAR | Status: AC
Start: 2014-03-30 — End: 2014-03-30
  Administered 2014-03-30: 4 mg via INTRAVENOUS
  Filled 2014-03-30: qty 2

## 2014-03-30 MED ORDER — HYDROMORPHONE HCL PF 1 MG/ML IJ SOLN
1.0000 mg | Freq: Once | INTRAMUSCULAR | Status: AC
  Administered 2014-03-30: 1 mg via INTRAVENOUS
  Filled 2014-03-30: qty 1

## 2014-03-30 MED ORDER — ONDANSETRON 4 MG PO TBDP: ORAL_TABLET | ORAL | Status: DC

## 2014-03-30 MED ORDER — OXYCODONE-ACETAMINOPHEN 5-325 MG PO TABS: 1.0000 | ORAL_TABLET | Freq: Four times a day (QID) | ORAL | Status: DC | PRN

## 2014-03-30 NOTE — ED Notes (Signed)
MD at bedside. 

## 2014-03-30 NOTE — ED Notes (Signed)
PT says she was diagnosed with an abdominal hernia about a month ago and she states the pain is getting worse.

## 2014-03-30 NOTE — Discharge Instructions (Signed)
Follow up with dr. Arnoldo Morale next week.  Call today for an appointment.  Return if needed

## 2014-03-30 NOTE — ED Provider Notes (Signed)
CSN: [id number]     Arrival date & time 03/30/14  [id number] History  This chart was scribed for [name], MD by Lowella Petties, ED Scribe. The patient was seen in room [facility]. Patient's care was started at 8:55 AM.     Chief Complaint  Patient presents with  . Hernia   Patient is a 48 y.o. female presenting with abdominal pain. The history is provided by the patient. No language interpreter was used.  Abdominal Pain Pain location:  LLQ Pain quality: aching and sharp   Pain radiates to:  Epigastric region and RUQ Pain severity:  Severe Onset quality:  Gradual Timing:  Constant Progression:  Worsening Chronicity:  Recurrent Relieved by:  Nothing Worsened by:  Palpation Ineffective treatments:  None tried Associated symptoms: nausea   Associated symptoms: no chest pain, no cough, no diarrhea, no fatigue and no hematuria    HPI Comments: Martha [name] is a 48 y.o. female who presents to the Emergency Department complaining of a constant painful knot in her LLQ onset 2 months ago and gradually worsening. She states that the pain radiates into her epigastric region. She reports associated Nausea. She denies vomiting. She states that she was diagnosed with an abdominal hernia about a month ago and that the pain is getting worse. She reports a history of gall stones.  Past Medical History  Diagnosis Date  . Diabetes mellitus   . Hypertension   . RLS (restless legs syndrome)   . Acid reflux   . Bipolar 1 disorder   . Depression   . OSA (obstructive sleep apnea)     cpap  . Anemia   . COPD (chronic obstructive pulmonary disease)     bronchitis  . Arthritis   . Degenerative disc disease, thoracic    Past Surgical History  Procedure Laterality Date  . Tubal ligation    . Endometrial ablation    . Colonoscopy  06/06/2010    [id number] bleeding secondary to internal hemorrhoids but incomplete evaluation secondary to poor right colon prep/small rectal and sigmoid colon polyps  (hyperplastic). PROPOFOL  . Esophagogastroduodenoscopy  06/06/2010    [id number] erythema and edema of body of stomach, with sessile polypoid lesions. bx benign. no h.pylori  . Colonoscopy  May 2013    Dr. Gilliam/[facility]: 5 mm a descending colon polyp, hyperplastic. Adequate bowel prep.  . Flexible sigmoidoscopy N/A 05/17/2013    Dr. Oneida Alar: moderate sized internal hemorrhoids  . Esophagogastroduodenoscopy (egd) with propofol N/A 05/17/2013    Dr. Oneida Alar: normal esophagus, moderate nodular gastritis, negative path, empiric Savary dilation  . Savory dilation N/A 05/17/2013    Procedure: SAVORY DILATION;  Surgeon: [name], MD;  Location: [facility];  Service: Endoscopy;  Laterality: N/A;  14/15/16  . Hemorrhoid banding N/A 05/17/2013    Procedure: HEMORRHOID BANDING;  Surgeon: [name], MD;  Location: [facility];  Service: Endoscopy;  Laterality: N/A;  2 bands placed  . Esophageal biopsy N/A 05/17/2013    Procedure: BIOPSY;  Surgeon: [name], MD;  Location: [facility];  Service: Endoscopy;  Laterality: N/A;  . Irrigation and debridement abscess Right 06/01/2013    Procedure: INCISION AND DRAINAGE AND DEBRIDEMENT ABSCESS RIGHT BREAST;  Surgeon: [name], MD;  Location: [facility];  Service: General;  Laterality: Right;  . Incision and drainage abscess Left 10/11/2013    Procedure: INCISION AND DRAINAGE AND DEBRIDEMENT LEFT BREAST  ABSCESS;  Surgeon: [name], MD;  Location: [facility];  Service: General;  Laterality: Left;   Family History  Problem Relation Age of Onset  . Adopted: Yes   History  Substance Use Topics  . Smoking status: Former Smoker -- 1.50 packs/day for 25 years    Types: Cigarettes    Quit date: 10/23/2007  . Smokeless tobacco: Former Systems developer     Comment: Quit x 6 years ago  . Alcohol Use: No   OB History   Grav Para Term Preterm Abortions TAB SAB Ect Mult Living   2 2 1 1      2      Review of Systems  Constitutional: Negative for appetite change  and fatigue.  HENT: Negative for congestion, ear discharge and sinus pressure.   Eyes: Negative for discharge.  Respiratory: Negative for cough.   Cardiovascular: Negative for chest pain.  Gastrointestinal: Positive for nausea and abdominal pain. Negative for diarrhea.  Genitourinary: Negative for frequency and hematuria.  Musculoskeletal: Negative for back pain.  Skin: Negative for rash.  Neurological: Negative for seizures and headaches.  Psychiatric/Behavioral: Negative for hallucinations.   Allergies  Codeine  Home Medications   Prior to Admission medications   Medication Sig Start Date End Date Taking? Authorizing Provider  albuterol (PROAIR HFA) 108 (90 BASE) MCG/ACT inhaler Inhale 2 puffs into the lungs every 6 (six) hours as needed for wheezing or shortness of breath.    Historical Provider, MD  albuterol (PROVENTIL) (2.5 MG/3ML) 0.083% nebulizer solution Take 2.5 mg by nebulization 4 (four) times daily.     Historical Provider, MD  ALPRAZolam Duanne Moron) 1 MG tablet Take 1 mg by mouth 4 (four) times daily.      Historical Provider, MD  amLODipine (NORVASC) 10 MG tablet Take 10 mg by mouth daily.    Historical Provider, MD  CINNAMON PO Take 1 capsule by mouth daily.    Historical Provider, MD  dicyclomine (BENTYL) 10 MG capsule Take 1 capsule (10 mg total) by mouth 4 (four) times daily -  before meals and at bedtime. 02/09/14   Orvil Feil, NP  HYDROcodone-acetaminophen (NORCO) 10-325 MG per tablet Take 1 tablet by mouth every 6 (six) hours as needed for pain.    Historical Provider, MD  HYDROcodone-acetaminophen (NORCO/VICODIN) 5-325 MG per tablet Take 2 tablets by mouth every 4 (four) hours as needed for moderate pain. 02/05/14   Johnna Acosta, MD  hyoscyamine (LEVSIN SL) 0.125 MG SL tablet Place 1 tablet (0.125 mg total) under the tongue every 4 (four) hours as needed. 02/15/14   Orvil Feil, NP  Insulin Aspart Prot & Aspart (NOVOLOG MIX 70/30 FLEXPEN) (70-30) 100 UNIT/ML Pen Inject  60 Units into the skin 2 (two) times daily.     Historical Provider, MD  lisinopril-hydrochlorothiazide (PRINZIDE,ZESTORETIC) 20-12.5 MG per tablet Take 1 tablet by mouth 2 (two) times daily.     Historical Provider, MD  metFORMIN (GLUCOPHAGE) 1000 MG tablet Take 1,000 mg by mouth 2 (two) times daily with a meal.    Historical Provider, MD  metroNIDAZOLE (FLAGYL) 500 MG tablet Take 1 tablet (500 mg total) by mouth 3 (three) times daily. 02/09/14   Orvil Feil, NP  omeprazole (PRILOSEC) 20 MG capsule Take 20 mg by mouth 2 (two) times daily.     Historical Provider, MD  potassium chloride SA (K-DUR,KLOR-CON) 20 MEQ tablet Take 1 tablet (20 mEq total) by mouth 2 (two) times daily. 02/05/14   Johnna Acosta, MD  promethazine (PHENERGAN) 25 MG tablet Take 1 tablet (25  mg total) by mouth every 6 (six) hours as needed for nausea or vomiting. 02/09/14   Orvil Feil, NP  Pseudoephedrine-Ibuprofen (ADVIL COLD/SINUS PO) Take 1 tablet by mouth as needed (sinus headache).    Historical Provider, MD  rOPINIRole (REQUIP) 1 MG tablet Take 1 mg by mouth at bedtime.     Historical Provider, MD  simvastatin (ZOCOR) 40 MG tablet Take 40 mg by mouth every evening.    Historical Provider, MD  UNKNOWN TO PATIENT Apply 1 application topically daily as needed (for irritation (vaginal area) COMPOUND MEDICATION: Ketoconazole mixed with another medication  (name is unknown)).    Historical Provider, MD   Triage Vitals: BP 155/83  Pulse 82  Temp(Src) 97.7 F (36.5 C) (Oral)  Resp 18  Ht 5\' 7"  (1.702 m)  Wt 355 lb (161.027 kg)  BMI 55.59 kg/m2  SpO2 100% Physical Exam  Nursing note and vitals reviewed. Constitutional: She is oriented to person, place, and time. She appears well-developed.  HENT:  Head: Normocephalic.  Eyes: Conjunctivae and EOM are normal. No scleral icterus.  Neck: Neck supple. No thyromegaly present.  Cardiovascular: Normal rate and regular rhythm.  Exam reveals no gallop and no friction rub.   No  murmur heard. Pulmonary/Chest: No stridor. She has no wheezes. She has no rales. She exhibits no tenderness.  Abdominal: She exhibits no distension. There is no tenderness. There is no rebound.  I can fee an abdominal hernia that is about 3 CM diameter and just superior to the umbilicus. She also has severe RUQ tenderness.   Musculoskeletal: Normal range of motion. She exhibits no edema.  Lymphadenopathy:    She has no cervical adenopathy.  Neurological: She is oriented to person, place, and time. She exhibits normal muscle tone. Coordination normal.  Skin: No rash noted. No erythema.  Psychiatric: She has a normal mood and affect. Her behavior is normal.    ED Course  Procedures (including critical care time) DIAGNOSTIC STUDIES: Oxygen Saturation is 100% on room air, normal by my interpretation.    COORDINATION OF CARE: 9:01 AM-Discussed treatment plan which includes CT-Scan with pt at bedside and pt agreed to plan.   Labs Review Labs Reviewed - No data to display  Imaging Review No results found.   EKG Interpretation None      MDM   Final diagnoses:  None   Pt with gall stones and abd hernia.  Nl  Labs.  Pt to follow up with general surgery   The chart was scribed for me under my direct supervision.  I personally performed the history, physical, and medical decision making and all procedures in the evaluation of this patient.[name], MD 03/30/14 [id number]

## 2014-04-02 ENCOUNTER — Emergency Department (HOSPITAL_COMMUNITY)
Admission: EM | Admit: 2014-04-02 | Discharge: 2014-04-03 | Disposition: A | Discharge status: Home / Self Care | Payer: Medicaid Other | Attending: Emergency Medicine | Admitting: Emergency Medicine

## 2014-04-02 ENCOUNTER — Encounter (HOSPITAL_COMMUNITY): Payer: Self-pay | Admitting: Emergency Medicine

## 2014-04-02 DIAGNOSIS — J4489 Other specified chronic obstructive pulmonary disease: Secondary | ICD-10-CM | POA: Insufficient documentation

## 2014-04-02 DIAGNOSIS — I10 Essential (primary) hypertension: Secondary | ICD-10-CM | POA: Diagnosis not present

## 2014-04-02 DIAGNOSIS — G4733 Obstructive sleep apnea (adult) (pediatric): Secondary | ICD-10-CM | POA: Diagnosis not present

## 2014-04-02 DIAGNOSIS — Z9851 Tubal ligation status: Secondary | ICD-10-CM | POA: Diagnosis not present

## 2014-04-02 DIAGNOSIS — R1013 Epigastric pain: Secondary | ICD-10-CM | POA: Insufficient documentation

## 2014-04-02 DIAGNOSIS — M129 Arthropathy, unspecified: Secondary | ICD-10-CM | POA: Insufficient documentation

## 2014-04-02 DIAGNOSIS — E119 Type 2 diabetes mellitus without complications: Secondary | ICD-10-CM | POA: Insufficient documentation

## 2014-04-02 DIAGNOSIS — F329 Major depressive disorder, single episode, unspecified: Secondary | ICD-10-CM | POA: Insufficient documentation

## 2014-04-02 DIAGNOSIS — Z794 Long term (current) use of insulin: Secondary | ICD-10-CM | POA: Insufficient documentation

## 2014-04-02 DIAGNOSIS — Z9889 Other specified postprocedural states: Secondary | ICD-10-CM | POA: Diagnosis not present

## 2014-04-02 DIAGNOSIS — J449 Chronic obstructive pulmonary disease, unspecified: Secondary | ICD-10-CM | POA: Insufficient documentation

## 2014-04-02 DIAGNOSIS — Z862 Personal history of diseases of the blood and blood-forming organs and certain disorders involving the immune mechanism: Secondary | ICD-10-CM | POA: Diagnosis not present

## 2014-04-02 DIAGNOSIS — G2581 Restless legs syndrome: Secondary | ICD-10-CM | POA: Insufficient documentation

## 2014-04-02 DIAGNOSIS — R739 Hyperglycemia, unspecified: Secondary | ICD-10-CM

## 2014-04-02 DIAGNOSIS — F3289 Other specified depressive episodes: Secondary | ICD-10-CM | POA: Insufficient documentation

## 2014-04-02 DIAGNOSIS — Z87891 Personal history of nicotine dependence: Secondary | ICD-10-CM | POA: Insufficient documentation

## 2014-04-02 DIAGNOSIS — Z9981 Dependence on supplemental oxygen: Secondary | ICD-10-CM | POA: Diagnosis not present

## 2014-04-02 DIAGNOSIS — K219 Gastro-esophageal reflux disease without esophagitis: Secondary | ICD-10-CM | POA: Insufficient documentation

## 2014-04-02 DIAGNOSIS — Z79899 Other long term (current) drug therapy: Secondary | ICD-10-CM | POA: Insufficient documentation

## 2014-04-02 DIAGNOSIS — R109 Unspecified abdominal pain: Secondary | ICD-10-CM | POA: Diagnosis present

## 2014-04-02 LAB — COMPREHENSIVE METABOLIC PANEL
ALT: 12 U/L (ref 0–35)
AST: 8 U/L (ref 0–37)
Albumin: 2.7 g/dL — ABNORMAL LOW (ref 3.5–5.2)
Alkaline Phosphatase: 88 U/L (ref 39–117)
Anion gap: 15 (ref 5–15)
BUN: 32 mg/dL — AB (ref 6–23)
CALCIUM: 9.3 mg/dL (ref 8.4–10.5)
CO2: 23 mEq/L (ref 19–32)
CREATININE: 1.2 mg/dL — AB (ref 0.50–1.10)
Chloride: 93 mEq/L — ABNORMAL LOW (ref 96–112)
GFR calc non Af Amer: 53 mL/min — ABNORMAL LOW (ref >90)
GFR, EST AFRICAN AMERICAN: 62 mL/min — AB (ref >90)
GLUCOSE: 555 mg/dL — AB (ref 70–99)
Potassium: 4.2 mEq/L (ref 3.7–5.3)
Sodium: 131 mEq/L — ABNORMAL LOW (ref 137–147)
Total Bilirubin: 0.2 mg/dL — ABNORMAL LOW (ref 0.3–1.2)
Total Protein: 6.7 g/dL (ref 6.0–8.3)

## 2014-04-02 LAB — CBC WITH DIFFERENTIAL/PLATELET
Basophils Absolute: 0 10*3/uL (ref 0.0–0.1)
Basophils Relative: 1 % (ref 0–1)
EOS ABS: 0.1 10*3/uL (ref 0.0–0.7)
EOS PCT: 1 % (ref 0–5)
HCT: 37.7 % (ref 36.0–46.0)
Hemoglobin: 13.3 g/dL (ref 12.0–15.0)
LYMPHS ABS: 2.6 10*3/uL (ref 0.7–4.0)
Lymphocytes Relative: 43 % (ref 12–46)
MCH: 29 pg (ref 26.0–34.0)
MCHC: 35.3 g/dL (ref 30.0–36.0)
MCV: 82.1 fL (ref 78.0–100.0)
Monocytes Absolute: 0.3 10*3/uL (ref 0.1–1.0)
Monocytes Relative: 4 % (ref 3–12)
Neutro Abs: 3.1 10*3/uL (ref 1.7–7.7)
Neutrophils Relative %: 51 % (ref 43–77)
Platelets: 204 10*3/uL (ref 150–400)
RBC: 4.59 MIL/uL (ref 3.87–5.11)
RDW: 14.1 % (ref 11.5–15.5)
WBC: 6 10*3/uL (ref 4.0–10.5)

## 2014-04-02 LAB — URINALYSIS, ROUTINE W REFLEX MICROSCOPIC
BILIRUBIN URINE: NEGATIVE
Glucose, UA: >1000 mg/dL — AB
Ketones, ur: NEGATIVE mg/dL
Leukocytes, UA: NEGATIVE
Nitrite: NEGATIVE
Protein, ur: >300 mg/dL — AB
SPECIFIC GRAVITY, URINE: 1.01 (ref 1.005–1.030)
UROBILINOGEN UA: 0.2 mg/dL (ref 0.0–1.0)
pH: 6.5 (ref 5.0–8.0)

## 2014-04-02 LAB — URINE MICROSCOPIC-ADD ON

## 2014-04-02 LAB — LIPASE, BLOOD: Lipase: 80 U/L — ABNORMAL HIGH (ref 11–59)

## 2014-04-02 MED ORDER — ONDANSETRON HCL 4 MG/2ML IJ SOLN
4.0000 mg | Freq: Once | INTRAMUSCULAR | Status: AC
  Administered 2014-04-02: 4 mg via INTRAVENOUS
  Filled 2014-04-02: qty 2

## 2014-04-02 MED ORDER — ONDANSETRON 4 MG PO TBDP
4.0000 mg | ORAL_TABLET | Freq: Once | ORAL | Status: DC
  Filled 2014-04-02: qty 1

## 2014-04-02 MED ORDER — GI COCKTAIL ~~LOC~~
30.0000 mL | Freq: Once | ORAL | Status: AC
  Administered 2014-04-02: 30 mL via ORAL
  Filled 2014-04-02: qty 30

## 2014-04-02 MED ORDER — PANTOPRAZOLE SODIUM 40 MG IV SOLR
40.0000 mg | Freq: Once | INTRAVENOUS | Status: AC
Start: 2014-04-02 — End: 2014-04-02
  Administered 2014-04-02: 40 mg via INTRAVENOUS
  Filled 2014-04-02: qty 40

## 2014-04-02 MED ORDER — MORPHINE SULFATE 4 MG/ML IJ SOLN
4.0000 mg | Freq: Once | INTRAMUSCULAR | Status: AC
  Administered 2014-04-02: 4 mg via INTRAVENOUS

## 2014-04-02 MED ORDER — SODIUM CHLORIDE 0.9 % IV BOLUS (SEPSIS)
1000.0000 mL | Freq: Once | INTRAVENOUS | Status: AC
  Administered 2014-04-02: 1000 mL via INTRAVENOUS

## 2014-04-02 MED ORDER — INSULIN ASPART 100 UNIT/ML ~~LOC~~ SOLN
10.0000 [IU] | Freq: Once | SUBCUTANEOUS | Status: AC
  Administered 2014-04-02: 10 [IU] via SUBCUTANEOUS
  Filled 2014-04-02: qty 1

## 2014-04-02 MED ORDER — MORPHINE SULFATE 4 MG/ML IJ SOLN
4.0000 mg | Freq: Once | INTRAMUSCULAR | Status: DC
  Filled 2014-04-02: qty 1

## 2014-04-02 NOTE — ED Notes (Signed)
Pt c/o abdominal pain. Pt states it hurts so bad, that it is getting numb. Pt states she was told she needs to have her gallbladder removed but can't be seen by Dr. Romona Curls until the 21st of Sept.

## 2014-04-02 NOTE — ED Notes (Signed)
CRITICAL VALUE ALERT  Critical value received:  Glucose  Date of notification:  09/06  Time of notification:  2315  Critical value read back:Yes.   Nurse who received alert:  bkn  MD notified (1st page):  Dr Lita Mains  Time of first page:  2317  MD notified (2nd page):  Time of second page:  Responding MD:    Time MD responded:

## 2014-04-02 NOTE — ED Provider Notes (Signed)
CSN: [id number]     Arrival date & time 04/02/14  2151 History   First MD Initiated Contact with Patient 04/02/14 2304    This chart was scribed for Julianne Rice, MD by Edison Simon, ED Scribe. This patient was seen in room APOTF/OTF and the patient's care was started at 11:28 PM.     Chief Complaint  Patient presents with  . Abdominal Pain   The history is provided by the patient. No language interpreter was used.    HPI Comments: FRANKLIN MCNEISH is a 48 y.o. female who presents to the Emergency Department complaining of  constant upper abdominal pain with onset over a month ago. She reports having a hernia and a gallstone. She states she has an appointment to see a surgeon on the September 21. She states she took pain medication tonight at 7:00PM which decreased pain but did not achieve remission of symptoms. She reports eating lots of pineapple and that pain is worse after eating pineapple, tomatoes, and spicy foods. She reports history of deteriorating disc in her back and diabetes with current hyperglycemia. Denies any nausea, vomiting or diarrhea. She's had no constipation. No blood in her stool. Denies any fevers or chills.  Past Medical History  Diagnosis Date  . Diabetes mellitus   . Hypertension   . RLS (restless legs syndrome)   . Acid reflux   . Bipolar 1 disorder   . Depression   . OSA (obstructive sleep apnea)     cpap  . Anemia   . COPD (chronic obstructive pulmonary disease)     bronchitis  . Arthritis   . Degenerative disc disease, thoracic    Past Surgical History  Procedure Laterality Date  . Tubal ligation    . Endometrial ablation    . Colonoscopy  06/06/2010    [id number] bleeding secondary to internal hemorrhoids but incomplete evaluation secondary to poor right colon prep/small rectal and sigmoid colon polyps (hyperplastic). PROPOFOL  . Esophagogastroduodenoscopy  06/06/2010    [id number] erythema and edema of body of stomach, with sessile polypoid  lesions. bx benign. no h.pylori  . Colonoscopy  May 2013    Dr. Gilliam/[facility]: 5 mm a descending colon polyp, hyperplastic. Adequate bowel prep.  . Flexible sigmoidoscopy N/A 05/17/2013    Dr. Oneida Alar: moderate sized internal hemorrhoids  . Esophagogastroduodenoscopy (egd) with propofol N/A 05/17/2013    Dr. Oneida Alar: normal esophagus, moderate nodular gastritis, negative path, empiric Savary dilation  . Savory dilation N/A 05/17/2013    Procedure: SAVORY DILATION;  Surgeon: [name], MD;  Location: [facility];  Service: Endoscopy;  Laterality: N/A;  14/15/16  . Hemorrhoid banding N/A 05/17/2013    Procedure: HEMORRHOID BANDING;  Surgeon: [name], MD;  Location: [facility];  Service: Endoscopy;  Laterality: N/A;  2 bands placed  . Esophageal biopsy N/A 05/17/2013    Procedure: BIOPSY;  Surgeon: [name], MD;  Location: [facility];  Service: Endoscopy;  Laterality: N/A;  . Irrigation and debridement abscess Right 06/01/2013    Procedure: INCISION AND DRAINAGE AND DEBRIDEMENT ABSCESS RIGHT BREAST;  Surgeon: [name], MD;  Location: [facility];  Service: General;  Laterality: Right;  . Incision and drainage abscess Left 10/11/2013    Procedure: INCISION AND DRAINAGE AND DEBRIDEMENT LEFT BREAST  ABSCESS;  Surgeon: [name], MD;  Location: [facility];  Service: General;  Laterality: Left;   Family History  Problem Relation Age of Onset  . Adopted: Yes   History  Substance Use Topics  . Smoking status: Former Smoker -- 1.50 packs/day for 25 years    Types: Cigarettes    Quit date: 10/23/2007  . Smokeless tobacco: Former Systems developer     Comment: Quit x 6 years ago  . Alcohol Use: No   OB History   Grav Para Term Preterm Abortions TAB SAB Ect Mult Living   2 2 1 1      2      Review of Systems  Constitutional: Negative for chills.  Respiratory: Negative for chest tightness and shortness of breath.   Cardiovascular: Negative for chest pain.  Gastrointestinal: Positive for  abdominal pain. Negative for nausea, vomiting, diarrhea and constipation.  Genitourinary: Negative for dysuria, frequency, hematuria and flank pain.  Musculoskeletal: Negative for back pain, neck pain and neck stiffness.  Skin: Negative for rash and wound.  Neurological: Negative for dizziness, weakness, light-headedness, numbness and headaches.  All other systems reviewed and are negative.     Allergies  Codeine  Home Medications   Prior to Admission medications   Medication Sig Start Date End Date Taking? Authorizing Provider  albuterol (PROVENTIL) (2.5 MG/3ML) 0.083% nebulizer solution Take 2.5 mg by nebulization 4 (four) times daily.    Yes Historical Provider, MD  ALPRAZolam Duanne Moron) 1 MG tablet Take 1 mg by mouth 4 (four) times daily.     Yes Historical Provider, MD  amLODipine (NORVASC) 10 MG tablet Take 10 mg by mouth daily.   Yes Historical Provider, MD  dicyclomine (BENTYL) 10 MG capsule Take 1 capsule (10 mg total) by mouth 4 (four) times daily -  before meals and at bedtime. 02/09/14  Yes Orvil Feil, NP  HYDROcodone-acetaminophen (NORCO) 10-325 MG per tablet Take 1 tablet by mouth every 6 (six) hours as needed for pain.   Yes Historical Provider, MD  Insulin Aspart Prot & Aspart (NOVOLOG MIX 70/30 FLEXPEN) (70-30) 100 UNIT/ML Pen Inject 60 Units into the skin 2 (two) times daily.    Yes Historical Provider, MD  lisinopril-hydrochlorothiazide (PRINZIDE,ZESTORETIC) 20-12.5 MG per tablet Take 1 tablet by mouth 2 (two) times daily.    Yes Historical Provider, MD  metFORMIN (GLUCOPHAGE) 1000 MG tablet Take 1,000 mg by mouth 2 (two) times daily with a meal.   Yes Historical Provider, MD  omeprazole (PRILOSEC) 20 MG capsule Take 20 mg by mouth 2 (two) times daily as needed (acid reflux).    Yes Historical Provider, MD  ondansetron (ZOFRAN ODT) 4 MG disintegrating tablet 4mg  ODT q4 hours prn nausea/vomit 03/30/14  Yes [name], MD  potassium chloride SA (K-DUR,KLOR-CON) 20 MEQ  tablet Take 1 tablet (20 mEq total) by mouth 2 (two) times daily. 02/05/14  Yes Johnna Acosta, MD  Pseudoephedrine-Ibuprofen (ADVIL COLD/SINUS PO) Take 1 tablet by mouth as needed (sinus headache).   Yes Historical Provider, MD  rOPINIRole (REQUIP) 1 MG tablet Take 1 mg by mouth at bedtime.    Yes Historical Provider, MD  simvastatin (ZOCOR) 40 MG tablet Take 40 mg by mouth every evening.   Yes Historical Provider, MD  UNKNOWN TO PATIENT Apply 1 application topically daily as needed (for irritation (vaginal area) COMPOUND MEDICATION: Ketoconazole mixed with another medication  (name is unknown)).   Yes Historical Provider, MD  ondansetron (ZOFRAN ODT) 4 MG disintegrating tablet 4mg  ODT q4 hours prn nausea/vomit 04/03/14   Julianne Rice, MD  oxyCODONE-acetaminophen (PERCOCET/ROXICET) 5-325 MG per tablet Take 1 tablet by mouth every 6 (six) hours as needed. 03/30/14   Broadus John  Ellard Artis, MD  sucralfate (CARAFATE) 1 GM/10ML suspension Take 10 mLs (1 g total) by mouth 3 (three) times daily as needed (upper abdominal pain). 04/03/14   Julianne Rice, MD   BP 177/100  Pulse 76  Temp(Src) 98.9 F (37.2 C) (Oral)  Resp 20  Ht 5\' 8"  (1.727 m)  Wt 342 lb 9 oz (155.385 kg)  BMI 52.10 kg/m2  SpO2 98% Physical Exam  Nursing note and vitals reviewed. Constitutional: She is oriented to person, place, and time. She appears well-developed and well-nourished. No distress.  HENT:  Head: Normocephalic and atraumatic.  Mouth/Throat: Oropharynx is clear and moist.  Eyes: EOM are normal. Pupils are equal, round, and reactive to light.  Neck: Normal range of motion. Neck supple.  Cardiovascular: Normal rate and regular rhythm.   Pulmonary/Chest: Effort normal and breath sounds normal. No respiratory distress. She has no wheezes. She has no rales.  Abdominal: Soft. Bowel sounds are normal. She exhibits no distension and no mass. There is tenderness (mild diffuse tenderness to palpation especially in the epigastric  region.). There is no rebound and no guarding.  Musculoskeletal: Normal range of motion. She exhibits no edema and no tenderness.  No CVA tenderness bilaterally.  Neurological: She is alert and oriented to person, place, and time.  Skin: Skin is warm and dry. No rash noted. No erythema.  Psychiatric: She has a normal mood and affect. Her behavior is normal.    ED Course  Procedures (including critical care time) Labs Review Labs Reviewed  URINALYSIS, ROUTINE W REFLEX MICROSCOPIC - Abnormal; Notable for the following:    Glucose, UA >1000 (*)    Hgb urine dipstick SMALL (*)    Protein, ur >300 (*)    All other components within normal limits  COMPREHENSIVE METABOLIC PANEL - Abnormal; Notable for the following:    Sodium 131 (*)    Chloride 93 (*)    Glucose, Bld 555 (*)    BUN 32 (*)    Creatinine, Ser 1.20 (*)    Albumin 2.7 (*)    Total Bilirubin 0.2 (*)    GFR calc non Af Amer 53 (*)    GFR calc Af Amer 62 (*)    All other components within normal limits  URINE MICROSCOPIC-ADD ON - Abnormal; Notable for the following:    Squamous Epithelial / LPF MANY (*)    Bacteria, UA MANY (*)    All other components within normal limits  LIPASE, BLOOD - Abnormal; Notable for the following:    Lipase 80 (*)    All other components within normal limits  CBG MONITORING, ED - Abnormal; Notable for the following:    Glucose-Capillary 394 (*)    All other components within normal limits  CBC WITH DIFFERENTIAL    Imaging Review No results found.   EKG Interpretation None     DIAGNOSTIC STUDIES: Oxygen Saturation is 99% on room air, normal by my interpretation.    COORDINATION OF CARE:    MDM   Final diagnoses:  Epigastric pain  Hyperglycemia    I personally performed the services described in this documentation, which was scribed in my presence. The recorded information has been reviewed and is accurate.   Patient is feeling much better after medication. Suspect  gastritis is caused her symptoms. Lipase is mildly elevated. Advised followup with her surgeon and primary doctor as an outpatient. He's also been given urology followup. She understands return precautions.  Julianne Rice, MD 04/03/14 [id number]

## 2014-04-03 LAB — CBG MONITORING, ED: Glucose-Capillary: 394 mg/dL — ABNORMAL HIGH (ref 70–99)

## 2014-04-03 MED ORDER — SUCRALFATE 1 GM/10ML PO SUSP: 1.0000 g | Freq: Three times a day (TID) | ORAL | Status: DC | PRN

## 2014-04-03 MED ORDER — ONDANSETRON 4 MG PO TBDP
ORAL_TABLET | ORAL | Status: DC
Start: 2014-04-03 — End: 2014-04-10

## 2014-04-03 MED ORDER — IPRATROPIUM-ALBUTEROL 0.5-2.5 (3) MG/3ML IN SOLN
3.0000 mL | Freq: Once | RESPIRATORY_TRACT | Status: AC
  Administered 2014-04-03: 3 mL via RESPIRATORY_TRACT
  Filled 2014-04-03: qty 3

## 2014-04-03 NOTE — Discharge Instructions (Signed)
Followup with your primary Dr. and general surgeon as already scheduled. He may also need to followup with gastroenterologist if your symptoms persist. Avoid all acidic, spicy foods. Avoid cigarette smoking and alcohol. Avoid NSAIDs. Return immediately to emergency department for worsening pain, fever, persistent vomiting, blood in vomit or in stool.   Abdominal Pain, Women Abdominal (stomach, pelvic, or belly) pain can be caused by many things. It is important to tell your doctor:  The location of the pain.  Does it come and go or is it present all the time?  Are there things that start the pain (eating certain foods, exercise)?  Are there other symptoms associated with the pain (fever, nausea, vomiting, diarrhea)? All of this is helpful to know when trying to find the cause of the pain. CAUSES   Stomach: virus or bacteria infection, or ulcer.  Intestine: appendicitis (inflamed appendix), regional ileitis (Crohn's disease), ulcerative colitis (inflamed colon), irritable bowel syndrome, diverticulitis (inflamed diverticulum of the colon), or cancer of the stomach or intestine.  Gallbladder disease or stones in the gallbladder.  Kidney disease, kidney stones, or infection.  Pancreas infection or cancer.  Fibromyalgia (pain disorder).  Diseases of the female organs:  Uterus: fibroid (non-cancerous) tumors or infection.  Fallopian tubes: infection or tubal pregnancy.  Ovary: cysts or tumors.  Pelvic adhesions (scar tissue).  Endometriosis (uterus lining tissue growing in the pelvis and on the pelvic organs).  Pelvic congestion syndrome (female organs filling up with blood just before the menstrual period).  Pain with the menstrual period.  Pain with ovulation (producing an egg).  Pain with an IUD (intrauterine device, birth control) in the uterus.  Cancer of the female organs.  Functional pain (pain not caused by a disease, may improve without  treatment).  Psychological pain.  Depression. DIAGNOSIS  Your doctor will decide the seriousness of your pain by doing an examination.  Blood tests.  X-rays.  Ultrasound.  CT scan (computed tomography, special type of X-ray).  MRI (magnetic resonance imaging).  Cultures, for infection.  Barium enema (dye inserted in the large intestine, to better view it with X-rays).  Colonoscopy (looking in intestine with a lighted tube).  Laparoscopy (minor surgery, looking in abdomen with a lighted tube).  Major abdominal exploratory surgery (looking in abdomen with a large incision). TREATMENT  The treatment will depend on the cause of the pain.   Many cases can be observed and treated at home.  Over-the-counter medicines recommended by your caregiver.  Prescription medicine.  Antibiotics, for infection.  Birth control pills, for painful periods or for ovulation pain.  Hormone treatment, for endometriosis.  Nerve blocking injections.  Physical therapy.  Antidepressants.  Counseling with a psychologist or psychiatrist.  Minor or major surgery. HOME CARE INSTRUCTIONS   Do not take laxatives, unless directed by your caregiver.  Take over-the-counter pain medicine only if ordered by your caregiver. Do not take aspirin because it can cause an upset stomach or bleeding.  Try a clear liquid diet (broth or water) as ordered by your caregiver. Slowly move to a bland diet, as tolerated, if the pain is related to the stomach or intestine.  Have a thermometer and take your temperature several times a day, and record it.  Bed rest and sleep, if it helps the pain.  Avoid sexual intercourse, if it causes pain.  Avoid stressful situations.  Keep your follow-up appointments and tests, as your caregiver orders.  If the pain does not go away with medicine or surgery, you  may try:  Acupuncture.  Relaxation exercises (yoga, meditation).  Group therapy.  Counseling. SEEK  MEDICAL CARE IF:   You notice certain foods cause stomach pain.  Your home care treatment is not helping your pain.  You need stronger pain medicine.  You want your IUD removed.  You feel faint or lightheaded.  You develop nausea and vomiting.  You develop a rash.  You are having side effects or an allergy to your medicine. SEEK IMMEDIATE MEDICAL CARE IF:   Your pain does not go away or gets worse.  You have a fever.  Your pain is felt only in portions of the abdomen. The right side could possibly be appendicitis. The left lower portion of the abdomen could be colitis or diverticulitis.  You are passing blood in your stools (bright red or black tarry stools, with or without vomiting).  You have blood in your urine.  You develop chills, with or without a fever.  You pass out. MAKE SURE YOU:   Understand these instructions.  Will watch your condition.  Will get help right away if you are not doing well or get worse. Document Released: 05/11/2007 Document Revised: 11/28/2013 Document Reviewed: 05/31/2009 [facility] Patient Information 2015 [place], [place]. This information is not intended to replace advice given to you by your health care provider. Make sure you discuss any questions you have with your health care provider.

## 2014-04-06 ENCOUNTER — Ambulatory Visit (INDEPENDENT_AMBULATORY_CARE_PROVIDER_SITE_OTHER): Payer: Medicaid Other | Admitting: Gastroenterology

## 2014-04-06 ENCOUNTER — Encounter: Payer: Self-pay | Admitting: Gastroenterology

## 2014-04-06 VITALS — BP 141/95 | HR 84 | Temp 97.9°F | Ht 68.0 in | Wt 335.2 lb

## 2014-04-06 DIAGNOSIS — K429 Umbilical hernia without obstruction or gangrene: Secondary | ICD-10-CM

## 2014-04-06 DIAGNOSIS — K625 Hemorrhage of anus and rectum: Secondary | ICD-10-CM

## 2014-04-06 DIAGNOSIS — K8020 Calculus of gallbladder without cholecystitis without obstruction: Secondary | ICD-10-CM

## 2014-04-06 DIAGNOSIS — D538 Other specified nutritional anemias: Secondary | ICD-10-CM

## 2014-04-06 DIAGNOSIS — K219 Gastro-esophageal reflux disease without esophagitis: Secondary | ICD-10-CM

## 2014-04-06 NOTE — Patient Instructions (Signed)
CONTINUE YOUR WEIGHT LOSS EFFORTS.  CONTINUE OMEPRAZOLE.  TAKE 30 MINUTES PRIOR TO YOUR MEALS TWICE DAILY.  DRINK WATER TO KEEP YOUR URINE LIGHT YELLOW.  FOLLOW A HIGH FIBER/LOW FAT DIET.   FOLLOW UP IN 6 MOS.

## 2014-04-06 NOTE — Progress Notes (Signed)
cc'ed to pcp °

## 2014-04-06 NOTE — Assessment & Plan Note (Signed)
ONE 2.7 CM. U/S RESULTS REVIEWED.  DISCUSSED GALLSTONES, AND BENEFITS, RISKS, OF SURGICAL MANAGEMENT OF GALLSTONES.

## 2014-04-06 NOTE — Assessment & Plan Note (Signed)
C/O INTERMITTENT SEVERE ABDOMINAL PAIN.  CONTINUE TO MONITOR SYMPTOMS OF OBSTRUCTION. COMPLETE VISIT WITH DR. Romona Curls.

## 2014-04-06 NOTE — Progress Notes (Signed)
PATIENT NIC'D  °

## 2014-04-06 NOTE — Assessment & Plan Note (Signed)
NL Hb SEP 2015.  CONTINUE TO MONITOR SYMPTOMS.

## 2014-04-06 NOTE — Assessment & Plan Note (Signed)
SX FAIRLY WELL CONTROLLED.  CONTINUE YOUR WEIGHT LOSS EFFORTS. CONTINUE OMEPRAZOLE.  TAKE 30 MINUTES PRIOR TO YOUR MEALS TWICE DAILY. LOW FAT DIET FOLLOW UP IN 6 MOS.

## 2014-04-06 NOTE — Assessment & Plan Note (Signed)
SX RESOLVED.  CONTINUE TO MONITOR SYMPTOMS.  

## 2014-04-06 NOTE — Progress Notes (Signed)
Subjective:    Patient ID: Martha [name], female    DOB: 11/12/1965, 48 y.o.   MRN: [id number]  Alonza Bogus, MD  HPI Changed surgeon from DR. JENKINS TO DR. BRADFORD. GONNA SEE DR. BRADFORD SEP 21. LOST 10 LBS DUE TO PAIN IN HER BELLY AND GALLBLADDER. nausea, vomiting: RANDOM IF SHE GETS HOT OR BREATHES. RARE: diarrhea. SHORTNESS OF BREATH WHEN SHE WALKS-THINKS BECAUSE "I'M FAT". BMs: USU. DAILY. ABD PAIN AROUND HER BULGE. GRANDSON(DOB [id number]) RAN INTO HER STOMACH.  PT DENIES FEVER, CHILLS, HEMATOCHEZIA, HEMATEMESIS, melena,  CHEST PAIN,  CHANGE IN BOWEL IN HABITS, constipation,  problems swallowing, problems with sedation, OR heartburn or indigestion.  Past Medical History  Diagnosis Date  . Diabetes mellitus   . Hypertension   . RLS (restless legs syndrome)   . Acid reflux   . Bipolar 1 disorder   . Depression   . OSA (obstructive sleep apnea)     cpap  . Anemia   . COPD (chronic obstructive pulmonary disease)     bronchitis  . Arthritis   . Degenerative disc disease, thoracic    Past Surgical History  Procedure Laterality Date  . Tubal ligation    . Endometrial ablation    . Colonoscopy  06/06/2010    [id number] bleeding secondary to internal hemorrhoids but incomplete evaluation secondary to poor right colon prep/small rectal and sigmoid colon polyps (hyperplastic). PROPOFOL  . Esophagogastroduodenoscopy  06/06/2010    [id number] erythema and edema of body of stomach, with sessile polypoid lesions. bx benign. no h.pylori  . Colonoscopy  May 2013    Dr. Gilliam/[facility]: 5 mm a descending colon polyp, hyperplastic. Adequate bowel prep.  . Flexible sigmoidoscopy N/A 05/17/2013    Dr. Oneida Alar: moderate sized internal hemorrhoids  . Esophagogastroduodenoscopy (egd) with propofol N/A 05/17/2013    Dr. Oneida Alar: normal esophagus, moderate nodular gastritis, negative path, empiric Savary dilation  . Savory dilation N/A 05/17/2013    Procedure: SAVORY DILATION;  Surgeon:  [name], MD;  Location: [facility];  Service: Endoscopy;  Laterality: N/A;  14/15/16  . Hemorrhoid banding N/A 05/17/2013    Procedure: HEMORRHOID BANDING;  Surgeon: [name], MD;  Location: [facility];  Service: Endoscopy;  Laterality: N/A;  2 bands placed  . Esophageal biopsy N/A 05/17/2013    Procedure: BIOPSY;  Surgeon: [name], MD;  Location: [facility];  Service: Endoscopy;  Laterality: N/A;  . Irrigation and debridement abscess Right 06/01/2013    Procedure: INCISION AND DRAINAGE AND DEBRIDEMENT ABSCESS RIGHT BREAST;  Surgeon: [name], MD;  Location: [facility];  Service: General;  Laterality: Right;  . Incision and drainage abscess Left 10/11/2013    Procedure: INCISION AND DRAINAGE AND DEBRIDEMENT LEFT BREAST  ABSCESS;  Surgeon: [name], MD;  Location: [facility];  Service: General;  Laterality: Left;   Allergies  Allergen Reactions  . Codeine Nausea And Vomiting    Tolerates the medication if taken with food    Current Outpatient Prescriptions  Medication Sig Dispense Refill  . albuterol (PROVENTIL) (2.5 MG/3ML) 0.083% nebulizer solution Take 2.5 mg by nebulization 4 (four) times daily.       Marland Kitchen ALPRAZolam (XANAX) 1 MG tablet Take 1 mg by mouth 4 (four) times daily.        Marland Kitchen amLODipine (NORVASC) 10 MG tablet Take 10 mg by mouth daily.      Marland Kitchen dicyclomine (BENTYL) 10 MG capsule Take 1 capsule (10 mg total) by mouth 4 (four)  times daily -  before meals and at bedtime. NOT TAKING   . Insulin Aspart Prot & Aspart (NOVOLOG MIX 70/30 FLEXPEN) (70-30) 100 UNIT/ML Pen Inject 80 Units into the skin 2 (two) times daily.     Marland Kitchen lisinopril-hydrochlorothiazide (PRINZIDE,ZESTORETIC) 20-12.5 MG per tablet Take 1 tablet by mouth 2 (two) times daily.     . metFORMIN (GLUCOPHAGE) 1000 MG tablet Take 1,000 mg by mouth 2 (two) times daily with a meal.    . omeprazole (PRILOSEC) 20 MG capsule Take 20 mg by mouth 2 (two) times daily       . ondansetron (ZOFRAN ODT) 4 MG disintegrating  tablet 4mg  ODT q4 hours prn nausea/vomit    . oxyCODONE-acetaminophen (PERCOCET/ROXICET) 5-325 MG per tablet Take 1 tablet by mouth every 6 (six) hours as needed.    . potassium chloride SA (K-DUR,KLOR-CON) 20 MEQ tablet Take 1 tablet (20 mEq total) by mouth 2 (two) times daily.    Marland Kitchen rOPINIRole (REQUIP) 1 MG tablet Take 1 mg by mouth at bedtime.     . simvastatin (ZOCOR) 40 MG tablet Take 40 mg by mouth every evening.    Marland Kitchen UNKNOWN TO PATIENT Apply 1 application topically daily as needed (for irritation (vaginal area) COMPOUND MEDICATION: Ketoconazole mixed with another medication  (name is unknown)).    Marland Kitchen HYDROcodone-acetaminophen (NORCO) 10-325 MG per tablet Take 1 tablet by mouth every 6 (six) hours as needed for pain.    Marland Kitchen ondansetron (ZOFRAN ODT) 4 MG disintegrating tablet 4mg  ODT q4 hours prn nausea/vomit    . Pseudoephedrine-Ibuprofen (ADVIL COLD/SINUS PO) Take 1 tablet by mouth as needed (sinus headache).    .         Review of Systems     Objective:   Physical Exam  Vitals reviewed. Constitutional: She is oriented to person, place, and time. She appears well-developed and well-nourished. No distress.  HENT:  Head: Normocephalic and atraumatic.  Mouth/Throat: Oropharynx is clear and moist. No oropharyngeal exudate.  Eyes: Pupils are equal, round, and reactive to light. No scleral icterus.  Neck: Normal range of motion. Neck supple.  Cardiovascular: Normal rate, regular rhythm and normal heart sounds.   Pulmonary/Chest: Effort normal and breath sounds normal. No respiratory distress.  Abdominal: Soft. Bowel sounds are normal. She exhibits no distension. There is tenderness.  MILD TO MODERATE PERI-UMBILICAL TTP   Musculoskeletal: She exhibits no edema.  Lymphadenopathy:    She has no cervical adenopathy.  Neurological: She is alert and oriented to person, place, and time.  NO FOCAL DEFICITS   Psychiatric: She has a normal mood and affect.          Assessment & Plan:

## 2014-04-10 ENCOUNTER — Encounter (HOSPITAL_COMMUNITY): Payer: Self-pay | Admitting: Emergency Medicine

## 2014-04-10 ENCOUNTER — Emergency Department (HOSPITAL_COMMUNITY)
Admission: EM | Admit: 2014-04-10 | Discharge: 2014-04-10 | Disposition: A | Discharge status: Home / Self Care | Payer: Medicaid Other | Attending: Emergency Medicine | Admitting: Emergency Medicine

## 2014-04-10 DIAGNOSIS — Z862 Personal history of diseases of the blood and blood-forming organs and certain disorders involving the immune mechanism: Secondary | ICD-10-CM | POA: Insufficient documentation

## 2014-04-10 DIAGNOSIS — M129 Arthropathy, unspecified: Secondary | ICD-10-CM | POA: Insufficient documentation

## 2014-04-10 DIAGNOSIS — Z87891 Personal history of nicotine dependence: Secondary | ICD-10-CM | POA: Insufficient documentation

## 2014-04-10 DIAGNOSIS — I10 Essential (primary) hypertension: Secondary | ICD-10-CM | POA: Insufficient documentation

## 2014-04-10 DIAGNOSIS — Z794 Long term (current) use of insulin: Secondary | ICD-10-CM | POA: Insufficient documentation

## 2014-04-10 DIAGNOSIS — M79606 Pain in leg, unspecified: Secondary | ICD-10-CM

## 2014-04-10 DIAGNOSIS — Z8739 Personal history of other diseases of the musculoskeletal system and connective tissue: Secondary | ICD-10-CM | POA: Diagnosis not present

## 2014-04-10 DIAGNOSIS — J449 Chronic obstructive pulmonary disease, unspecified: Secondary | ICD-10-CM | POA: Insufficient documentation

## 2014-04-10 DIAGNOSIS — K219 Gastro-esophageal reflux disease without esophagitis: Secondary | ICD-10-CM | POA: Insufficient documentation

## 2014-04-10 DIAGNOSIS — G4733 Obstructive sleep apnea (adult) (pediatric): Secondary | ICD-10-CM | POA: Diagnosis not present

## 2014-04-10 DIAGNOSIS — F3289 Other specified depressive episodes: Secondary | ICD-10-CM | POA: Diagnosis not present

## 2014-04-10 DIAGNOSIS — Z79899 Other long term (current) drug therapy: Secondary | ICD-10-CM | POA: Diagnosis not present

## 2014-04-10 DIAGNOSIS — R269 Unspecified abnormalities of gait and mobility: Secondary | ICD-10-CM | POA: Insufficient documentation

## 2014-04-10 DIAGNOSIS — F329 Major depressive disorder, single episode, unspecified: Secondary | ICD-10-CM | POA: Insufficient documentation

## 2014-04-10 DIAGNOSIS — E54 Ascorbic acid deficiency: Secondary | ICD-10-CM | POA: Diagnosis not present

## 2014-04-10 DIAGNOSIS — M79609 Pain in unspecified limb: Secondary | ICD-10-CM | POA: Insufficient documentation

## 2014-04-10 DIAGNOSIS — Z8669 Personal history of other diseases of the nervous system and sense organs: Secondary | ICD-10-CM | POA: Insufficient documentation

## 2014-04-10 DIAGNOSIS — M549 Dorsalgia, unspecified: Secondary | ICD-10-CM | POA: Insufficient documentation

## 2014-04-10 DIAGNOSIS — E119 Type 2 diabetes mellitus without complications: Secondary | ICD-10-CM | POA: Insufficient documentation

## 2014-04-10 DIAGNOSIS — Z9981 Dependence on supplemental oxygen: Secondary | ICD-10-CM | POA: Insufficient documentation

## 2014-04-10 DIAGNOSIS — J4489 Other specified chronic obstructive pulmonary disease: Secondary | ICD-10-CM | POA: Insufficient documentation

## 2014-04-10 DIAGNOSIS — E1165 Type 2 diabetes mellitus with hyperglycemia: Secondary | ICD-10-CM

## 2014-04-10 LAB — CBC WITH DIFFERENTIAL/PLATELET
Basophils Absolute: 0 10*3/uL (ref 0.0–0.1)
Basophils Relative: 1 % (ref 0–1)
EOS ABS: 0.1 10*3/uL (ref 0.0–0.7)
EOS PCT: 2 % (ref 0–5)
HCT: 37.6 % (ref 36.0–46.0)
Hemoglobin: 13.2 g/dL (ref 12.0–15.0)
LYMPHS ABS: 2.3 10*3/uL (ref 0.7–4.0)
Lymphocytes Relative: 43 % (ref 12–46)
MCH: 28.5 pg (ref 26.0–34.0)
MCHC: 35.1 g/dL (ref 30.0–36.0)
MCV: 81.2 fL (ref 78.0–100.0)
Monocytes Absolute: 0.2 10*3/uL (ref 0.1–1.0)
Monocytes Relative: 4 % (ref 3–12)
Neutro Abs: 2.6 10*3/uL (ref 1.7–7.7)
Neutrophils Relative %: 50 % (ref 43–77)
PLATELETS: 195 10*3/uL (ref 150–400)
RBC: 4.63 MIL/uL (ref 3.87–5.11)
RDW: 14.3 % (ref 11.5–15.5)
WBC: 5.2 10*3/uL (ref 4.0–10.5)

## 2014-04-10 LAB — COMPREHENSIVE METABOLIC PANEL
ALT: 13 U/L (ref 0–35)
ANION GAP: 14 (ref 5–15)
AST: 9 U/L (ref 0–37)
Albumin: 2.8 g/dL — ABNORMAL LOW (ref 3.5–5.2)
Alkaline Phosphatase: 93 U/L (ref 39–117)
BUN: 15 mg/dL (ref 6–23)
CALCIUM: 9.7 mg/dL (ref 8.4–10.5)
CO2: 25 mEq/L (ref 19–32)
CREATININE: 1.37 mg/dL — AB (ref 0.50–1.10)
Chloride: 94 mEq/L — ABNORMAL LOW (ref 96–112)
GFR calc non Af Amer: 45 mL/min — ABNORMAL LOW (ref >90)
GFR, EST AFRICAN AMERICAN: 53 mL/min — AB (ref >90)
GLUCOSE: 541 mg/dL — AB (ref 70–99)
Potassium: 4.2 mEq/L (ref 3.7–5.3)
Sodium: 133 mEq/L — ABNORMAL LOW (ref 137–147)
Total Bilirubin: 0.3 mg/dL (ref 0.3–1.2)
Total Protein: 6.6 g/dL (ref 6.0–8.3)

## 2014-04-10 LAB — URINALYSIS, ROUTINE W REFLEX MICROSCOPIC
Bilirubin Urine: NEGATIVE
Glucose, UA: 500 mg/dL — AB
Ketones, ur: NEGATIVE mg/dL
Leukocytes, UA: NEGATIVE
Nitrite: NEGATIVE
Protein, ur: >300 mg/dL — AB
SPECIFIC GRAVITY, URINE: 1.015 (ref 1.005–1.030)
UROBILINOGEN UA: 0.2 mg/dL (ref 0.0–1.0)
pH: 5.5 (ref 5.0–8.0)

## 2014-04-10 LAB — CBG MONITORING, ED
GLUCOSE-CAPILLARY: 534 mg/dL — AB (ref 70–99)
Glucose-Capillary: 458 mg/dL — ABNORMAL HIGH (ref 70–99)
Glucose-Capillary: 429 mg/dL — ABNORMAL HIGH (ref 70–99)

## 2014-04-10 LAB — URINE MICROSCOPIC-ADD ON

## 2014-04-10 MED ORDER — HYDROCODONE-ACETAMINOPHEN 5-325 MG PO TABS: 1.0000 | ORAL_TABLET | ORAL | Status: DC | PRN

## 2014-04-10 MED ORDER — SODIUM CHLORIDE 0.9 % IV SOLN
1000.0000 mL | INTRAVENOUS | Status: DC
  Administered 2014-04-10: 1000 mL via INTRAVENOUS

## 2014-04-10 MED ORDER — MORPHINE SULFATE 4 MG/ML IJ SOLN
4.0000 mg | Freq: Once | INTRAMUSCULAR | Status: AC
  Administered 2014-04-10: 4 mg via INTRAVENOUS
  Filled 2014-04-10: qty 1

## 2014-04-10 MED ORDER — ONDANSETRON HCL 4 MG/2ML IJ SOLN
4.0000 mg | Freq: Once | INTRAMUSCULAR | Status: AC
  Administered 2014-04-10: 4 mg via INTRAVENOUS
  Filled 2014-04-10: qty 2

## 2014-04-10 MED ORDER — SODIUM CHLORIDE 0.9 % IV SOLN
1000.0000 mL | Freq: Once | INTRAVENOUS | Status: AC
  Administered 2014-04-10: 1000 mL via INTRAVENOUS

## 2014-04-10 NOTE — ED Notes (Addendum)
Patient complaining of bilateral leg pain x 2 weeks. States "I think the cold weather is making it worse." Patient denies injury. Ambulated to triage with no assistance or difficulty.

## 2014-04-10 NOTE — ED Provider Notes (Signed)
CSN: [id number]     Arrival date & time 04/10/14  [id number] History   First MD Initiated Contact with Patient 04/10/14 1003     Chief Complaint  Patient presents with  . Leg Pain     (Consider location/radiation/quality/duration/timing/severity/associated sxs/prior Treatment) HPI Comments: The patient is a 48 year old female who presents to the emergency department with a complaint of bilateral leg pain. The patient gives a two-week history of pain in both lower legs from the knee down to the ankle. The patient states that sometimes the pain is sharp, other times it is a pain that she cannot describe, but this morning it made her wake up in 2 years. The patient thinks that the pain is affected by changes in the weather. The patient denies any recent injury or trauma to the lower extremities. She states that her activities have been mostly as usual. She's not had any high fevers, no bug bites, no recent changes in her medication. It is of note that the patient is diabetic. It is of note that the patient has a history of" restless leg syndrome". Patient states that her current medications are not working.  The history is provided by the patient.    Past Medical History  Diagnosis Date  . Diabetes mellitus   . Hypertension   . RLS (restless legs syndrome)   . Acid reflux   . Bipolar 1 disorder   . Depression   . OSA (obstructive sleep apnea)     cpap  . Anemia   . COPD (chronic obstructive pulmonary disease)     bronchitis  . Arthritis   . Degenerative disc disease, thoracic    Past Surgical History  Procedure Laterality Date  . Tubal ligation    . Endometrial ablation    . Colonoscopy  06/06/2010    [id number] bleeding secondary to internal hemorrhoids but incomplete evaluation secondary to poor right colon prep/small rectal and sigmoid colon polyps (hyperplastic). PROPOFOL  . Esophagogastroduodenoscopy  06/06/2010    [id number] erythema and edema of body of stomach, with sessile  polypoid lesions. bx benign. no h.pylori  . Colonoscopy  May 2013    Dr. Gilliam/[facility]: 5 mm a descending colon polyp, hyperplastic. Adequate bowel prep.  . Flexible sigmoidoscopy N/A 05/17/2013    Dr. Oneida Alar: moderate sized internal hemorrhoids  . Esophagogastroduodenoscopy (egd) with propofol N/A 05/17/2013    Dr. Oneida Alar: normal esophagus, moderate nodular gastritis, negative path, empiric Savary dilation  . Savory dilation N/A 05/17/2013    Procedure: SAVORY DILATION;  Surgeon: [name], MD;  Location: [facility];  Service: Endoscopy;  Laterality: N/A;  14/15/16  . Hemorrhoid banding N/A 05/17/2013    Procedure: HEMORRHOID BANDING;  Surgeon: [name], MD;  Location: [facility];  Service: Endoscopy;  Laterality: N/A;  2 bands placed  . Esophageal biopsy N/A 05/17/2013    Procedure: BIOPSY;  Surgeon: [name], MD;  Location: [facility];  Service: Endoscopy;  Laterality: N/A;  . Irrigation and debridement abscess Right 06/01/2013    Procedure: INCISION AND DRAINAGE AND DEBRIDEMENT ABSCESS RIGHT BREAST;  Surgeon: [name], MD;  Location: [facility];  Service: General;  Laterality: Right;  . Incision and drainage abscess Left 10/11/2013    Procedure: INCISION AND DRAINAGE AND DEBRIDEMENT LEFT BREAST  ABSCESS;  Surgeon: [name], MD;  Location: [facility];  Service: General;  Laterality: Left;   Family History  Problem Relation Age of Onset  . Adopted: Yes   History  Substance  Use Topics  . Smoking status: Former Smoker -- 1.50 packs/day for 25 years    Types: Cigarettes    Quit date: 10/23/2007  . Smokeless tobacco: Former Systems developer     Comment: Quit x 6 years ago  . Alcohol Use: No   OB History   Grav Para Term Preterm Abortions TAB SAB Ect Mult Living   2 2 1 1      2      Review of Systems  Constitutional: Negative for activity change.       All ROS Neg except as noted in HPI  Eyes: Negative for photophobia and discharge.  Respiratory: Negative for cough, shortness  of breath and wheezing.   Cardiovascular: Negative for chest pain and palpitations.  Gastrointestinal: Negative for abdominal pain and blood in stool.  Genitourinary: Negative for dysuria, frequency and hematuria.  Musculoskeletal: Positive for arthralgias, back pain and gait problem. Negative for neck pain.  Skin: Negative.  Negative for rash.  Neurological: Negative for dizziness, seizures and speech difficulty.  Psychiatric/Behavioral: Negative for hallucinations and confusion.       Depression      Allergies  Codeine  Home Medications   Prior to Admission medications   Medication Sig Start Date End Date Taking? Authorizing Provider  albuterol (PROVENTIL) (2.5 MG/3ML) 0.083% nebulizer solution Take 2.5 mg by nebulization 4 (four) times daily.    Yes Historical Provider, MD  ALPRAZolam Duanne Moron) 1 MG tablet Take 1 mg by mouth 4 (four) times daily.     Yes Historical Provider, MD  amLODipine (NORVASC) 10 MG tablet Take 10 mg by mouth daily.   Yes Historical Provider, MD  dicyclomine (BENTYL) 10 MG capsule Take 1 capsule (10 mg total) by mouth 4 (four) times daily -  before meals and at bedtime. 02/09/14  Yes Orvil Feil, NP  HYDROcodone-acetaminophen (NORCO) 10-325 MG per tablet Take 1 tablet by mouth every 6 (six) hours as needed for pain.   Yes Historical Provider, MD  Insulin Aspart Prot & Aspart (NOVOLOG MIX 70/30 FLEXPEN) (70-30) 100 UNIT/ML Pen Inject 80 Units into the skin 2 (two) times daily.    Yes Historical Provider, MD  lisinopril-hydrochlorothiazide (PRINZIDE,ZESTORETIC) 20-12.5 MG per tablet Take 1 tablet by mouth 2 (two) times daily.    Yes Historical Provider, MD  metFORMIN (GLUCOPHAGE) 1000 MG tablet Take 1,000 mg by mouth 2 (two) times daily with a meal.   Yes Historical Provider, MD  omeprazole (PRILOSEC) 20 MG capsule Take 20 mg by mouth 2 (two) times daily as needed (acid reflux).    Yes Historical Provider, MD  ondansetron (ZOFRAN ODT) 4 MG disintegrating tablet 4mg   ODT q4 hours prn nausea/vomit 03/30/14  Yes [name], MD  oxyCODONE-acetaminophen (PERCOCET/ROXICET) 5-325 MG per tablet Take 1 tablet by mouth every 6 (six) hours as needed. 03/30/14  Yes [name], MD  potassium chloride SA (K-DUR,KLOR-CON) 20 MEQ tablet Take 1 tablet (20 mEq total) by mouth 2 (two) times daily. 02/05/14  Yes Johnna Acosta, MD  Pseudoephedrine-Ibuprofen (ADVIL COLD/SINUS PO) Take 1 tablet by mouth as needed (sinus headache).   Yes Historical Provider, MD  rOPINIRole (REQUIP) 1 MG tablet Take 1 mg by mouth at bedtime.    Yes Historical Provider, MD  simvastatin (ZOCOR) 40 MG tablet Take 40 mg by mouth every evening.   Yes Historical Provider, MD  UNKNOWN TO PATIENT Apply 1 application topically daily as needed (for irritation (vaginal area) COMPOUND MEDICATION: Ketoconazole mixed with another medication  (  name is unknown)).   Yes Historical Provider, MD  HYDROcodone-acetaminophen (NORCO/VICODIN) 5-325 MG per tablet Take 1 tablet by mouth every 4 (four) hours as needed. 04/10/14   Lenox Ahr, PA-C   BP 116/68  Pulse 76  Temp(Src) 97.9 F (36.6 C) (Oral)  Resp 18  Ht 5\' 8"  (1.727 m)  Wt 335 lb (151.955 kg)  BMI 50.95 kg/m2  SpO2 100% Physical Exam  Nursing note and vitals reviewed. Constitutional: She is oriented to person, place, and time. She appears well-developed and well-nourished.  Non-toxic appearance.  HENT:  Head: Normocephalic.  Right Ear: Tympanic membrane and external ear normal.  Left Ear: Tympanic membrane and external ear normal.  Eyes: EOM and lids are normal. Pupils are equal, round, and reactive to light.  Neck: Normal range of motion. Neck supple. Carotid bruit is not present.  Cardiovascular: Normal rate, regular rhythm, normal heart sounds, intact distal pulses and normal pulses.   Pulmonary/Chest: Breath sounds normal. No respiratory distress.  Abdominal: Soft. Bowel sounds are normal. There is no tenderness. There is no guarding.   Musculoskeletal: Normal range of motion.  There is good range of motion of right and left hip, but with some discomfort. There is good range of motion of both knees, but with crepitus present. There is no effusion of the knees noted. There is soreness to palpation of the anterior and posterior lower right and left leg. There is a negative Homans sign. There is no increased redness. There is no changes in size or temperature of the right and left lower legs. The dorsalis pedis pulses are 2+ bilaterally.  Lymphadenopathy:       Head (right side): No submandibular adenopathy present.       Head (left side): No submandibular adenopathy present.    She has no cervical adenopathy.  Neurological: She is alert and oriented to person, place, and time. She has normal strength. No cranial nerve deficit or sensory deficit.  Skin: Skin is warm and dry.  Psychiatric: She has a normal mood and affect. Her speech is normal.    ED Course  Procedures (including critical care time) Labs Review Labs Reviewed  COMPREHENSIVE METABOLIC PANEL - Abnormal; Notable for the following:    Sodium 133 (*)    Chloride 94 (*)    Glucose, Bld 541 (*)    Creatinine, Ser 1.37 (*)    Albumin 2.8 (*)    GFR calc non Af Amer 45 (*)    GFR calc Af Amer 53 (*)    All other components within normal limits  URINALYSIS, ROUTINE W REFLEX MICROSCOPIC - Abnormal; Notable for the following:    Glucose, UA 500 (*)    Hgb urine dipstick SMALL (*)    Protein, ur >300 (*)    All other components within normal limits  URINE MICROSCOPIC-ADD ON - Abnormal; Notable for the following:    Squamous Epithelial / LPF MANY (*)    Bacteria, UA FEW (*)    All other components within normal limits  CBG MONITORING, ED - Abnormal; Notable for the following:    Glucose-Capillary 534 (*)    All other components within normal limits  CBG MONITORING, ED - Abnormal; Notable for the following:    Glucose-Capillary 458 (*)    All other components  within normal limits  CBG MONITORING, ED - Abnormal; Notable for the following:    Glucose-Capillary 429 (*)    All other components within normal limits  CBC WITH DIFFERENTIAL  Imaging Review No results found.   EKG Interpretation None      MDM  Capillary blood glucose obtained and found to be elevated at 534. The patient had a comprehensive metabolic panel which revealed the sodium to be slightly low 133 and the chloride slightly low at 94. The CO2 was normal at 25. The potassium is 4.2, no evidence to support hypokalemia as a cause of the pain in the legs. The glucose was elevated at 541. Creatinine was also elevated at 1.37, this was higher than previous emergency department visits. Anion gap wnl at 14.   It is also of note that the patient's blood sugars have been running in the mid to high 300s and above on most of her emergency department visits. The glomerular filtration rate was low on today's visit at 73.  Complete blood count was well within normal limits. No evidence to support infectious process.  Urinalysis reveals a clear yellow specimen with a specific gravity of 1.015. There are 500 mg per decaliter of glucose. Protein was elevated at 300, leukocyte esterase and nitrates were both negative.  Patient was treated with IV fluids which improved the glucose to 458. After a second liter of IV fluids the glucose was down to 429. The patient states that this week she has been running about 400 most days.  I discussed with the patient the pattern of high glucose levels during most of her emergency department visits. I discussed with her the number of emergency department visits I encouraged her to include her primary physician in her health care more diligently. I discussed with the patient the gradual changes in her renal function and glomerular filtration rates, as well as increase proteins in the urine. The patient acknowledges receiving the information and understands the  importance of having these areas rechecked. The patient is concerned about the pain in her legs and why she is having it. I discussed with the patient the possibility of combination of causes including restless leg syndrome, microvascular diabetic disease, and diabetic neuropathies. Patient again strongly encouraged to have this discussion with her primary physician for additional evaluation and management. Prescription for Norco 15 tablets given to the patient.    Final diagnoses:  Hyperglycemia due to type 2 diabetes mellitus  Pain of lower extremity, unspecified laterality    *I have reviewed nursing notes, vital signs, and all appropriate lab and imaging results for this patient.**    Lenox Ahr, PA-C 04/10/14 [place], PA-C 04/10/14 [phone]

## 2014-04-10 NOTE — Discharge Instructions (Signed)
Your electrolytes on within normal limits, your kidney function is increasing. Your glucose has been elevated significantly on most of your emergency department visits. Please discuss your leg pain, your kidney function, and your elevated glucose levels with Dr. Luan Pulling as sone as possible. Please use Tylenol for mild pain, please use Norco for more severe pain. Please see Dr. Luan Pulling for additional evaluation and pain management. Hyperglycemia Hyperglycemia occurs when the glucose (sugar) in your blood is too high. Hyperglycemia can happen for many reasons, but it most often happens to people who do not know they have diabetes or are not managing their diabetes properly.  CAUSES  Whether you have diabetes or not, there are other causes of hyperglycemia. Hyperglycemia can occur when you have diabetes, but it can also occur in other situations that you might not be as aware of, such as: Diabetes  If you have diabetes and are having problems controlling your blood glucose, hyperglycemia could occur because of some of the following reasons:  Not following your meal plan.  Not taking your diabetes medications or not taking it properly.  Exercising less or doing less activity than you normally do.  Being sick. Pre-diabetes  This cannot be ignored. Before people develop Type 2 diabetes, they almost always have "pre-diabetes." This is when your blood glucose levels are higher than normal, but not yet high enough to be diagnosed as diabetes. Research has shown that some long-term damage to the body, especially the heart and circulatory system, may already be occurring during pre-diabetes. If you take action to manage your blood glucose when you have pre-diabetes, you may delay or prevent Type 2 diabetes from developing. Stress  If you have diabetes, you may be "diet" controlled or on oral medications or insulin to control your diabetes. However, you may find that your blood glucose is higher than usual  in the hospital whether you have diabetes or not. This is often referred to as "stress hyperglycemia." Stress can elevate your blood glucose. This happens because of hormones put out by the body during times of stress. If stress has been the cause of your high blood glucose, it can be followed regularly by your caregiver. That way he/she can make sure your hyperglycemia does not continue to get worse or progress to diabetes. Steroids  Steroids are medications that act on the infection fighting system (immune system) to block inflammation or infection. One side effect can be a rise in blood glucose. Most people can produce enough extra insulin to allow for this rise, but for those who cannot, steroids make blood glucose levels go even higher. It is not unusual for steroid treatments to "uncover" diabetes that is developing. It is not always possible to determine if the hyperglycemia will go away after the steroids are stopped. A special blood test called an A1c is sometimes done to determine if your blood glucose was elevated before the steroids were started. SYMPTOMS  Thirsty.  Frequent urination.  Dry mouth.  Blurred vision.  Tired or fatigue.  Weakness.  Sleepy.  Tingling in feet or leg. DIAGNOSIS  Diagnosis is made by monitoring blood glucose in one or all of the following ways:  A1c test. This is a chemical found in your blood.  Fingerstick blood glucose monitoring.  Laboratory results. TREATMENT  First, knowing the cause of the hyperglycemia is important before the hyperglycemia can be treated. Treatment may include, but is not be limited to:  Education.  Change or adjustment in medications.  Change or  adjustment in meal plan.  Treatment for an illness, infection, etc.  More frequent blood glucose monitoring.  Change in exercise plan.  Decreasing or stopping steroids.  Lifestyle changes. HOME CARE INSTRUCTIONS   Test your blood glucose as directed.  Exercise  regularly. Your caregiver will give you instructions about exercise. Pre-diabetes or diabetes which comes on with stress is helped by exercising.  Eat wholesome, balanced meals. Eat often and at regular, fixed times. Your caregiver or nutritionist will give you a meal plan to guide your sugar intake.  Being at an ideal weight is important. If needed, losing as little as 10 to 15 pounds may help improve blood glucose levels. SEEK MEDICAL CARE IF:   You have questions about medicine, activity, or diet.  You continue to have symptoms (problems such as increased thirst, urination, or weight gain). SEEK IMMEDIATE MEDICAL CARE IF:   You are vomiting or have diarrhea.  Your breath smells fruity.  You are breathing faster or slower.  You are very sleepy or incoherent.  You have numbness, tingling, or pain in your feet or hands.  You have chest pain.  Your symptoms get worse even though you have been following your caregiver's orders.  If you have any other questions or concerns. Document Released: 01/07/2001 Document Revised: 10/06/2011 Document Reviewed: 11/10/2011 [facility] Patient Information 2015 [place], [place]. This information is not intended to replace advice given to you by your health care provider. Make sure you discuss any questions you have with your health care provider.  Diabetic Neuropathy Diabetic neuropathy is a nerve disease or nerve damage that is caused by diabetes mellitus. About half of all people with diabetes mellitus have some form of nerve damage. Nerve damage is more common in those who have had diabetes mellitus for many years and who generally have not had good control of their blood sugar (glucose) level. Diabetic neuropathy is a common complication of diabetes mellitus. There are three more common types of diabetic neuropathy and a fourth type that is less common and less understood:   Peripheral neuropathy--This is the most common type of diabetic neuropathy.  It causes damage to the nerves of the feet and legs first and then eventually the hands and arms.The damage affects the ability to sense touch.  Autonomic neuropathy--This type causes damage to the autonomic nervous system, which controls the following functions:  Heartbeat.  Body temperature.  Blood pressure.  Urination.  Digestion.  Sweating.  Sexual function.  Focal neuropathy--Focal neuropathy can be painful and unpredictable and occurs most often in older adults with diabetes mellitus. It involves a specific nerve or one area and often comes on suddenly. It usually does not cause long-term problems.  Radiculoplexus neuropathy-- Sometimes called lumbosacral radiculoplexus neuropathy, radiculoplexus neuropathy affects the nerves of the thighs, hips, buttocks, or legs. It is more common in people with type 2 diabetes mellitus and in older men. It is characterized by debilitating pain, weakness, and atrophy, usually in the thigh muscles. CAUSES  The cause of peripheral, autonomic, and focal neuropathies is diabetes mellitus that is uncontrolled and high glucose levels. The cause of radiculoplexus neuropathy is unknown. However, it is thought to be caused by inflammation related to uncontrolled glucose levels. SIGNS AND SYMPTOMS  Peripheral Neuropathy Peripheral neuropathy develops slowly over time. When the nerves of the feet and legs no longer work there may be:   Burning, stabbing, or aching pain in the legs or feet.  Inability to feel pressure or pain in your feet.  This can lead to:  Thick calluses over pressure areas.  Pressure sores.  Ulcers.  Foot deformities.  Reduced ability to feel temperature changes.  Muscle weakness. Autonomic Neuropathy The symptoms of autonomic neuropathy vary depending on which nerves are affected. Symptoms may include:  Problems with digestion, such as:  Feeling sick to your stomach  (nausea).  Vomiting.  Bloating.  Constipation.  Diarrhea.  Abdominal pain.  Difficulty with urination. This occurs if you lose your ability to sense when your bladder is full. Problems include:  Urine leakage (incontinence).  Inability to empty your bladder completely (retention).  Rapid or irregular heartbeat (palpitations).  Blood pressure drops when you stand up (orthostatic hypotension). When you stand up you may feel:  Dizzy.  Weak.  Faint.  In men, inability to attain and maintain an erection.  In women, vaginal dryness and problems with decreased sexual desire and arousal.  Problems with body temperature regulation.  Increased or decreased sweating. Focal Neuropathy  Abnormal eye movements or abnormal alignment of both eyes.  Weakness in the wrist.  Foot drop. This results in an inability to lift the foot properly and abnormal walking or foot movement.  Paralysis on one side of your face (Bell palsy).  Chest or abdominal pain. Radiculoplexus Neuropathy  Sudden, severe pain in your hip, thigh, or buttocks.  Weakness and wasting of thigh muscles.  Difficulty rising from a seated position.  Abdominal swelling.  Unexplained weight loss (usually more than 10 lb [4.5 kg]). DIAGNOSIS  Peripheral Neuropathy Your senses may be tested. Sensory function testing can be done with:  A light touch using a monofilament.  A vibration with tuning fork.  A sharp sensation with a pin prick. Other tests that can help diagnose neuropathy are:  Nerve conduction velocity. This test checks the transmission of an electrical current through a nerve.  Electromyography. This shows how muscles respond to electrical signals transmitted by nearby nerves.  Quantitative sensory testing. This is used to assess how your nerves respond to vibrations and changes in temperature. Autonomic Neuropathy Diagnosis is often based on reported symptoms. Tell your health care  provider if you experience:   Dizziness.   Constipation.   Diarrhea.   Inappropriate urination or inability to urinate.   Inability to get or maintain an erection.  Tests that may be done include:   Electrocardiography or Holter monitor. These are tests that can help show problems with the heart rate or heart rhythm.   An X-ray exam may be done. Focal Neuropathy Diagnosis is made based on your symptoms and what your health care provider finds during your exam. Other tests may be done. They may include:  Nerve conduction velocities. This checks the transmission of electrical current through a nerve.  Electromyography. This shows how muscles respond to electrical signals transmitted by nearby nerves.  Quantitative sensory testing. This test is used to assess how your nerves respond to vibration and changes in temperature. Radiculoplexus Neuropathy  Often the first thing is to eliminate any other issue or problems that might be the cause, as there is no stick test for diagnosis.  X-ray exam of your spine and lumbar region.  Spinal tap to rule out cancer.  MRI to rule out other lesions. TREATMENT  Once nerve damage occurs, it cannot be reversed. The goal of treatment is to keep the disease or nerve damage from getting worse and affecting more nerve fibers. Controlling your blood glucose level is the key. Most people with radiculoplexus neuropathy see  at least a partial improvement over time. You will need to keep your blood glucose and HbA1c levels in the target range determined by your health care provider. Things that help control blood glucose levels include:   Blood glucose monitoring.   Meal planning.   Physical activity.   Diabetes medicine.  Over time, maintaining lower blood glucose levels helps lessen symptoms. Sometimes, prescription pain medicine is needed. HOME CARE INSTRUCTIONS:  Do not smoke.  Keep your blood glucose level in the range that you and  your health care provider have determined acceptable for you.  Keep your blood pressure level in the range that you and your health care provider have determined acceptable for you.  Eat a well-balanced diet.  Be active every day.  Check your feet every day. SEEK MEDICAL CARE IF:   You have burning, stabbing, or aching pain in the legs or feet.  You are unable to feel pressure or pain in your feet.  You develop problems with digestion such as:  Nausea.  Vomiting.  Bloating.  Constipation.  Diarrhea.  Abdominal pain.  You have difficulty with urination, such as:  Incontinence.  Retention.  You have palpitations.  You develop orthostatic hypotension. When you stand up you may feel:  Dizzy.  Weak.  Faint.  You cannot attain and maintain an erection (in men).  You have vaginal dryness and problems with decreased sexual desire and arousal (in women).  You have severe pain in your thighs, legs, or buttocks.  You have unexplained weight loss. Document Released: 09/22/2001 Document Revised: 05/04/2013 Document Reviewed: 12/23/2012 [facility] Patient Information 2015 [place], [place]. This information is not intended to replace advice given to you by your health care provider. Make sure you discuss any questions you have with your health care provider.

## 2014-04-14 NOTE — ED Provider Notes (Signed)
History/physical exam/procedure(s) were performed by non-physician practitioner and as supervising physician I was immediately available for consultation/collaboration. I have reviewed all notes and am in agreement with care and plan.   Shaune Pollack, MD 04/14/14 [id number]

## 2014-04-17 ENCOUNTER — Other Ambulatory Visit (HOSPITAL_COMMUNITY): Payer: Self-pay | Admitting: General Surgery

## 2014-04-17 DIAGNOSIS — K429 Umbilical hernia without obstruction or gangrene: Secondary | ICD-10-CM

## 2014-04-19 ENCOUNTER — Encounter (HOSPITAL_COMMUNITY): Payer: Self-pay | Admitting: Pharmacy Technician

## 2014-04-20 ENCOUNTER — Ambulatory Visit (HOSPITAL_COMMUNITY)
Admission: RE | Admit: 2014-04-20 | Discharge: 2014-04-20 | Disposition: A | Discharge status: Home / Self Care | Payer: Medicaid Other | Source: Ambulatory Visit | Attending: General Surgery | Admitting: General Surgery

## 2014-04-20 DIAGNOSIS — K8020 Calculus of gallbladder without cholecystitis without obstruction: Secondary | ICD-10-CM | POA: Diagnosis not present

## 2014-04-20 DIAGNOSIS — N281 Cyst of kidney, acquired: Secondary | ICD-10-CM | POA: Insufficient documentation

## 2014-04-20 DIAGNOSIS — R112 Nausea with vomiting, unspecified: Secondary | ICD-10-CM | POA: Insufficient documentation

## 2014-04-20 DIAGNOSIS — K429 Umbilical hernia without obstruction or gangrene: Secondary | ICD-10-CM | POA: Diagnosis not present

## 2014-04-20 MED ORDER — IOHEXOL 300 MG/ML  SOLN
80.0000 mL | Freq: Once | INTRAMUSCULAR | Status: AC | PRN
  Administered 2014-04-20: 80 mL via INTRAVENOUS

## 2014-04-21 ENCOUNTER — Encounter (HOSPITAL_COMMUNITY): Payer: Self-pay

## 2014-04-21 ENCOUNTER — Encounter (HOSPITAL_COMMUNITY)
Admission: RE | Admit: 2014-04-21 | Discharge: 2014-04-21 | Disposition: A | Discharge status: Home / Self Care | Payer: Medicaid Other | Source: Ambulatory Visit | Attending: General Surgery | Admitting: General Surgery

## 2014-04-21 DIAGNOSIS — Z01812 Encounter for preprocedural laboratory examination: Secondary | ICD-10-CM | POA: Diagnosis present

## 2014-04-21 DIAGNOSIS — Z01810 Encounter for preprocedural cardiovascular examination: Secondary | ICD-10-CM | POA: Diagnosis present

## 2014-04-21 DIAGNOSIS — K8020 Calculus of gallbladder without cholecystitis without obstruction: Secondary | ICD-10-CM | POA: Insufficient documentation

## 2014-04-21 DIAGNOSIS — K429 Umbilical hernia without obstruction or gangrene: Secondary | ICD-10-CM | POA: Diagnosis not present

## 2014-04-21 LAB — CBC WITH DIFFERENTIAL/PLATELET
Basophils Absolute: 0.1 10*3/uL (ref 0.0–0.1)
Basophils Relative: 1 % (ref 0–1)
EOS PCT: 2 % (ref 0–5)
Eosinophils Absolute: 0.1 10*3/uL (ref 0.0–0.7)
HEMATOCRIT: 39.9 % (ref 36.0–46.0)
Hemoglobin: 13.3 g/dL (ref 12.0–15.0)
LYMPHS ABS: 2.3 10*3/uL (ref 0.7–4.0)
Lymphocytes Relative: 39 % (ref 12–46)
MCH: 28.1 pg (ref 26.0–34.0)
MCHC: 33.3 g/dL (ref 30.0–36.0)
MCV: 84.4 fL (ref 78.0–100.0)
MONO ABS: 0.4 10*3/uL (ref 0.1–1.0)
Monocytes Relative: 6 % (ref 3–12)
NEUTROS ABS: 3 10*3/uL (ref 1.7–7.7)
Neutrophils Relative %: 52 % (ref 43–77)
Platelets: 231 10*3/uL (ref 150–400)
RBC: 4.73 MIL/uL (ref 3.87–5.11)
RDW: 14.1 % (ref 11.5–15.5)
WBC: 5.9 10*3/uL (ref 4.0–10.5)

## 2014-04-21 LAB — BASIC METABOLIC PANEL
Anion gap: 14 (ref 5–15)
BUN: 27 mg/dL — ABNORMAL HIGH (ref 6–23)
CO2: 27 meq/L (ref 19–32)
Calcium: 11.2 mg/dL — ABNORMAL HIGH (ref 8.4–10.5)
Chloride: 100 mEq/L (ref 96–112)
Creatinine, Ser: 2.36 mg/dL — ABNORMAL HIGH (ref 0.50–1.10)
GFR calc Af Amer: 27 mL/min — ABNORMAL LOW (ref >90)
GFR, EST NON AFRICAN AMERICAN: 24 mL/min — AB (ref >90)
GLUCOSE: 124 mg/dL — AB (ref 70–99)
POTASSIUM: 4.5 meq/L (ref 3.7–5.3)
Sodium: 141 mEq/L (ref 137–147)

## 2014-04-21 LAB — HEPATIC FUNCTION PANEL
ALT: 22 U/L (ref 0–35)
AST: 13 U/L (ref 0–37)
Albumin: 3.2 g/dL — ABNORMAL LOW (ref 3.5–5.2)
Alkaline Phosphatase: 94 U/L (ref 39–117)
Total Bilirubin: 0.3 mg/dL (ref 0.3–1.2)
Total Protein: 7.1 g/dL (ref 6.0–8.3)

## 2014-04-21 LAB — HCG, SERUM, QUALITATIVE: Preg, Serum: NEGATIVE

## 2014-04-21 LAB — AMYLASE: Amylase: 41 U/L (ref 0–105)

## 2014-04-21 NOTE — Patient Instructions (Signed)
Martha [name]  04/21/2014   Your procedure is scheduled on:   04/27/2014  Report to [facility] at  32  AM.  Call this number if you have problems the morning of surgery: [phone]   Remember:   Do not eat food or drink liquids after midnight.   Take these medicines the morning of surgery with A SIP OF WATER:  Xanax, amlodipine, hydrocodone, lisinopril, prilosec. Take 1/2 of your insulin dosage the night before your surgery.   Do not wear jewelry, make-up or nail polish.  Do not wear lotions, powders, or perfumes.   Do not shave 48 hours prior to surgery. Men may shave face and neck.  Do not bring valuables to the hospital.  [facility] is not responsible for any belongings or valuables.               Contacts, dentures or bridgework may not be worn into surgery.  Leave suitcase in the car. After surgery it may be brought to your room.  For patients admitted to the hospital, discharge time is determined by your treatment team.               Patients discharged the day of surgery will not be allowed to drive home.  Name and phone number of your driver: family  Special Instructions: Shower using CHG 2 nights before surgery and the night before surgery.  If you shower the day of surgery use CHG.  Use special wash - you have one bottle of CHG for all showers.  You should use approximately 1/3 of the bottle for each shower.   Please read over the following fact sheets that you were given: Pain Booklet, Coughing and Deep Breathing, Surgical Site Infection Prevention, Anesthesia Post-op Instructions and Care and Recovery After Surgery Hernia A hernia occurs when an internal organ pushes out through a weak spot in the abdominal wall. Hernias most commonly occur in the groin and around the navel. Hernias often can be pushed back into place (reduced). Most hernias tend to get worse over time. Some abdominal hernias can get stuck in the opening (irreducible or incarcerated hernia) and  cannot be reduced. An irreducible abdominal hernia which is tightly squeezed into the opening is at risk for impaired blood supply (strangulated hernia). A strangulated hernia is a medical emergency. Because of the risk for an irreducible or strangulated hernia, surgery may be recommended to repair a hernia. CAUSES   Heavy lifting.  Prolonged coughing.  Straining to have a bowel movement.  A cut (incision) made during an abdominal surgery. HOME CARE INSTRUCTIONS   Bed rest is not required. You may continue your normal activities.  Avoid lifting more than 10 pounds (4.5 kg) or straining.  Cough gently. If you are a smoker it is best to stop. Even the best hernia repair can break down with the continual strain of coughing. Even if you do not have your hernia repaired, a cough will continue to aggravate the problem.  Do not wear anything tight over your hernia. Do not try to keep it in with an outside bandage or truss. These can damage abdominal contents if they are trapped within the hernia sac.  Eat a normal diet.  Avoid constipation. Straining over long periods of time will increase hernia size and encourage breakdown of repairs. If you cannot do this with diet alone, stool softeners may be used. SEEK IMMEDIATE MEDICAL CARE IF:   You have a  fever.  You develop increasing abdominal pain.  You feel nauseous or vomit.  Your hernia is stuck outside the abdomen, looks discolored, feels hard, or is tender.  You have any changes in your bowel habits or in the hernia that are unusual for you.  You have increased pain or swelling around the hernia.  You cannot push the hernia back in place by applying gentle pressure while lying down. MAKE SURE YOU:   Understand these instructions.  Will watch your condition.  Will get help right away if you are not doing well or get worse. Document Released: 07/14/2005 Document Revised: 10/06/2011 Document Reviewed: 03/02/2008 [facility] Patient  Information 2015 [place], [place]. This information is not intended to replace advice given to you by your health care provider. Make sure you discuss any questions you have with your health care provider. Laparoscopic Cholecystectomy Laparoscopic cholecystectomy is surgery to remove the gallbladder. The gallbladder is located in the upper right part of the abdomen, behind the liver. It is a storage sac for bile produced in the liver. Bile aids in the digestion and absorption of fats. Cholecystectomy is often done for inflammation of the gallbladder (cholecystitis). This condition is usually caused by a buildup of gallstones (cholelithiasis) in your gallbladder. Gallstones can block the flow of bile, resulting in inflammation and pain. In severe cases, emergency surgery may be required. When emergency surgery is not required, you will have time to prepare for the procedure. Laparoscopic surgery is an alternative to open surgery. Laparoscopic surgery has a shorter recovery time. Your common bile duct may also need to be examined during the procedure. If stones are found in the common bile duct, they may be removed. LET [facility] CARE PROVIDER KNOW ABOUT:  Any allergies you have.  All medicines you are taking, including vitamins, herbs, eye drops, creams, and over-the-counter medicines.  Previous problems you or members of your family have had with the use of anesthetics.  Any blood disorders you have.  Previous surgeries you have had.  Medical conditions you have. RISKS AND COMPLICATIONS Generally, this is a safe procedure. However, as with any procedure, complications can occur. Possible complications include:  Infection.  Damage to the common bile duct, nerves, arteries, veins, or other internal organs such as the stomach, liver, or intestines.  Bleeding.  A stone may remain in the common bile duct.  A bile leak from the cyst duct that is clipped when your gallbladder is removed.  The  need to convert to open surgery, which requires a larger incision in the abdomen. This may be necessary if your surgeon thinks it is not safe to continue with a laparoscopic procedure. BEFORE THE PROCEDURE  Ask your health care provider about changing or stopping any regular medicines. You will need to stop taking aspirin or blood thinners at least 5 days prior to surgery.  Do not eat or drink anything after midnight the night before surgery.  Let your health care provider know if you develop a cold or other infectious problem before surgery. PROCEDURE   You will be given medicine to make you sleep through the procedure (general anesthetic). A breathing tube will be placed in your mouth.  When you are asleep, your surgeon will make several small cuts (incisions) in your abdomen.  A thin, lighted tube with a tiny camera on the end (laparoscope) is inserted through one of the small incisions. The camera on the laparoscope sends a picture to a TV screen in the operating room. This  gives the surgeon a good view inside your abdomen.  A gas will be pumped into your abdomen. This expands your abdomen so that the surgeon has more room to perform the surgery.  Other tools needed for the procedure are inserted through the other incisions. The gallbladder is removed through one of the incisions.  After the removal of your gallbladder, the incisions will be closed with stitches, staples, or skin glue. AFTER THE PROCEDURE  You will be taken to a recovery area where your progress will be checked often.  You may be allowed to go home the same day if your pain is controlled and you can tolerate liquids. Document Released: 07/14/2005 Document Revised: 05/04/2013 Document Reviewed: 02/23/2013 [facility] Patient Information 2015 [place], [place]. This information is not intended to replace advice given to you by your health care provider. Make sure you discuss any questions you have with your health care  provider. PATIENT INSTRUCTIONS POST-ANESTHESIA  IMMEDIATELY FOLLOWING SURGERY:  Do not drive or operate machinery for the first twenty four hours after surgery.  Do not make any important decisions for twenty four hours after surgery or while taking narcotic pain medications or sedatives.  If you develop intractable nausea and vomiting or a severe headache please notify your doctor immediately.  FOLLOW-UP:  Please make an appointment with your surgeon as instructed. You do not need to follow up with anesthesia unless specifically instructed to do so.  WOUND CARE INSTRUCTIONS (if applicable):  Keep a dry clean dressing on the anesthesia/puncture wound site if there is drainage.  Once the wound has quit draining you may leave it open to air.  Generally you should leave the bandage intact for twenty four hours unless there is drainage.  If the epidural site drains for more than 36-48 hours please call the anesthesia department.  QUESTIONS?:  Please feel free to call your physician or the hospital operator if you have any questions, and they will be happy to assist you.

## 2014-04-21 NOTE — Pre-Procedure Instructions (Signed)
Patient given information to sign up for my chart at home. 

## 2014-04-26 ENCOUNTER — Inpatient Hospital Stay (HOSPITAL_COMMUNITY)
Admission: EM | Admit: 2014-04-26 | Discharge: 2014-04-28 | DRG: 418 | Disposition: A | Discharge status: Home Health | Payer: Medicaid Other | Attending: Pulmonary Disease | Admitting: Pulmonary Disease

## 2014-04-26 ENCOUNTER — Encounter (HOSPITAL_COMMUNITY): Payer: Self-pay | Admitting: Emergency Medicine

## 2014-04-26 ENCOUNTER — Emergency Department (HOSPITAL_COMMUNITY): Payer: Medicaid Other

## 2014-04-26 DIAGNOSIS — G2581 Restless legs syndrome: Secondary | ICD-10-CM | POA: Diagnosis present

## 2014-04-26 DIAGNOSIS — N189 Chronic kidney disease, unspecified: Secondary | ICD-10-CM

## 2014-04-26 DIAGNOSIS — R109 Unspecified abdominal pain: Secondary | ICD-10-CM | POA: Diagnosis present

## 2014-04-26 DIAGNOSIS — I10 Essential (primary) hypertension: Secondary | ICD-10-CM | POA: Diagnosis present

## 2014-04-26 DIAGNOSIS — E871 Hypo-osmolality and hyponatremia: Secondary | ICD-10-CM | POA: Diagnosis present

## 2014-04-26 DIAGNOSIS — E1165 Type 2 diabetes mellitus with hyperglycemia: Secondary | ICD-10-CM | POA: Diagnosis present

## 2014-04-26 DIAGNOSIS — F319 Bipolar disorder, unspecified: Secondary | ICD-10-CM | POA: Diagnosis present

## 2014-04-26 DIAGNOSIS — K8010 Calculus of gallbladder with chronic cholecystitis without obstruction: Principal | ICD-10-CM | POA: Diagnosis present

## 2014-04-26 DIAGNOSIS — E780 Pure hypercholesterolemia: Secondary | ICD-10-CM | POA: Diagnosis present

## 2014-04-26 DIAGNOSIS — Z6841 Body Mass Index (BMI) 40.0 and over, adult: Secondary | ICD-10-CM

## 2014-04-26 DIAGNOSIS — K219 Gastro-esophageal reflux disease without esophagitis: Secondary | ICD-10-CM | POA: Diagnosis present

## 2014-04-26 DIAGNOSIS — E860 Dehydration: Secondary | ICD-10-CM | POA: Diagnosis present

## 2014-04-26 DIAGNOSIS — Z87891 Personal history of nicotine dependence: Secondary | ICD-10-CM

## 2014-04-26 DIAGNOSIS — E1129 Type 2 diabetes mellitus with other diabetic kidney complication: Secondary | ICD-10-CM

## 2014-04-26 DIAGNOSIS — J449 Chronic obstructive pulmonary disease, unspecified: Secondary | ICD-10-CM | POA: Diagnosis present

## 2014-04-26 DIAGNOSIS — G4733 Obstructive sleep apnea (adult) (pediatric): Secondary | ICD-10-CM | POA: Diagnosis present

## 2014-04-26 DIAGNOSIS — R0902 Hypoxemia: Secondary | ICD-10-CM | POA: Diagnosis present

## 2014-04-26 DIAGNOSIS — E785 Hyperlipidemia, unspecified: Secondary | ICD-10-CM | POA: Diagnosis present

## 2014-04-26 DIAGNOSIS — K429 Umbilical hernia without obstruction or gangrene: Secondary | ICD-10-CM | POA: Diagnosis present

## 2014-04-26 DIAGNOSIS — R1084 Generalized abdominal pain: Secondary | ICD-10-CM | POA: Diagnosis present

## 2014-04-26 DIAGNOSIS — N179 Acute kidney failure, unspecified: Secondary | ICD-10-CM | POA: Diagnosis not present

## 2014-04-26 DIAGNOSIS — G8929 Other chronic pain: Secondary | ICD-10-CM | POA: Diagnosis present

## 2014-04-26 DIAGNOSIS — Z794 Long term (current) use of insulin: Secondary | ICD-10-CM

## 2014-04-26 DIAGNOSIS — F418 Other specified anxiety disorders: Secondary | ICD-10-CM | POA: Diagnosis present

## 2014-04-26 DIAGNOSIS — G4730 Sleep apnea, unspecified: Secondary | ICD-10-CM | POA: Diagnosis present

## 2014-04-26 DIAGNOSIS — E11621 Type 2 diabetes mellitus with foot ulcer: Secondary | ICD-10-CM | POA: Diagnosis present

## 2014-04-26 DIAGNOSIS — E1122 Type 2 diabetes mellitus with diabetic chronic kidney disease: Secondary | ICD-10-CM

## 2014-04-26 DIAGNOSIS — L97529 Non-pressure chronic ulcer of other part of left foot with unspecified severity: Secondary | ICD-10-CM

## 2014-04-26 DIAGNOSIS — K8020 Calculus of gallbladder without cholecystitis without obstruction: Secondary | ICD-10-CM | POA: Diagnosis present

## 2014-04-26 HISTORY — DX: Obesity, unspecified: E66.9

## 2014-04-26 LAB — CBC WITH DIFFERENTIAL/PLATELET
BASOS ABS: 0.1 10*3/uL (ref 0.0–0.1)
BASOS PCT: 1 % (ref 0–1)
EOS ABS: 0.2 10*3/uL (ref 0.0–0.7)
EOS PCT: 3 % (ref 0–5)
HEMATOCRIT: 39.2 % (ref 36.0–46.0)
HEMOGLOBIN: 13.4 g/dL (ref 12.0–15.0)
Lymphocytes Relative: 41 % (ref 12–46)
Lymphs Abs: 2.9 10*3/uL (ref 0.7–4.0)
MCH: 28.5 pg (ref 26.0–34.0)
MCHC: 34.2 g/dL (ref 30.0–36.0)
MCV: 83.4 fL (ref 78.0–100.0)
MONO ABS: 0.3 10*3/uL (ref 0.1–1.0)
MONOS PCT: 4 % (ref 3–12)
Neutro Abs: 3.7 10*3/uL (ref 1.7–7.7)
Neutrophils Relative %: 51 % (ref 43–77)
Platelets: 233 10*3/uL (ref 150–400)
RBC: 4.7 MIL/uL (ref 3.87–5.11)
RDW: 13.8 % (ref 11.5–15.5)
WBC: 7.2 10*3/uL (ref 4.0–10.5)

## 2014-04-26 LAB — COMPREHENSIVE METABOLIC PANEL
ALBUMIN: 3.3 g/dL — AB (ref 3.5–5.2)
ALT: 27 U/L (ref 0–35)
ANION GAP: 17 — AB (ref 5–15)
AST: 18 U/L (ref 0–37)
Alkaline Phosphatase: 96 U/L (ref 39–117)
BUN: 32 mg/dL — AB (ref 6–23)
CALCIUM: 10.3 mg/dL (ref 8.4–10.5)
CO2: 23 mEq/L (ref 19–32)
CREATININE: 3.43 mg/dL — AB (ref 0.50–1.10)
Chloride: 96 mEq/L (ref 96–112)
GFR calc Af Amer: 17 mL/min — ABNORMAL LOW (ref >90)
GFR calc non Af Amer: 15 mL/min — ABNORMAL LOW (ref >90)
Glucose, Bld: 284 mg/dL — ABNORMAL HIGH (ref 70–99)
Potassium: 4.4 mEq/L (ref 3.7–5.3)
Sodium: 136 mEq/L — ABNORMAL LOW (ref 137–147)
Total Bilirubin: 0.4 mg/dL (ref 0.3–1.2)
Total Protein: 7.3 g/dL (ref 6.0–8.3)

## 2014-04-26 LAB — MRSA PCR SCREENING: MRSA BY PCR: NEGATIVE

## 2014-04-26 LAB — GLUCOSE, CAPILLARY
GLUCOSE-CAPILLARY: 340 mg/dL — AB (ref 70–99)
Glucose-Capillary: 235 mg/dL — ABNORMAL HIGH (ref 70–99)

## 2014-04-26 MED ORDER — SODIUM CHLORIDE 0.9 % IV BOLUS (SEPSIS)
1000.0000 mL | Freq: Once | INTRAVENOUS | Status: AC
  Administered 2014-04-26: 1000 mL via INTRAVENOUS

## 2014-04-26 MED ORDER — ALPRAZOLAM 1 MG PO TABS
1.0000 mg | ORAL_TABLET | Freq: Four times a day (QID) | ORAL | Status: DC
  Administered 2014-04-26 – 2014-04-28 (×7): 1 mg via ORAL
  Filled 2014-04-26 (×8): qty 1

## 2014-04-26 MED ORDER — INSULIN ASPART 100 UNIT/ML ~~LOC~~ SOLN
0.0000 [IU] | Freq: Every day | SUBCUTANEOUS | Status: DC
Start: 2014-04-26 — End: 2014-04-28
  Administered 2014-04-26: 5 [IU] via SUBCUTANEOUS

## 2014-04-26 MED ORDER — SODIUM CHLORIDE 0.9 % IV SOLN
INTRAVENOUS | Status: DC
  Administered 2014-04-26 – 2014-04-28 (×5): via INTRAVENOUS

## 2014-04-26 MED ORDER — DEXTROSE 5 % IV SOLN
1.0000 g | Freq: Two times a day (BID) | INTRAVENOUS | Status: DC
  Administered 2014-04-26 – 2014-04-28 (×4): 1 g via INTRAVENOUS
  Filled 2014-04-26 (×10): qty 10

## 2014-04-26 MED ORDER — AMLODIPINE BESYLATE 5 MG PO TABS
10.0000 mg | ORAL_TABLET | Freq: Every day | ORAL | Status: DC
  Administered 2014-04-27 – 2014-04-28 (×2): 10 mg via ORAL
  Filled 2014-04-26 (×3): qty 2

## 2014-04-26 MED ORDER — ALBUTEROL SULFATE (2.5 MG/3ML) 0.083% IN NEBU
2.5000 mg | INHALATION_SOLUTION | Freq: Four times a day (QID) | RESPIRATORY_TRACT | Status: DC
  Administered 2014-04-26 – 2014-04-28 (×5): 2.5 mg via RESPIRATORY_TRACT
  Filled 2014-04-26 (×6): qty 3

## 2014-04-26 MED ORDER — ROPINIROLE HCL 1 MG PO TABS
1.0000 mg | ORAL_TABLET | Freq: Every day | ORAL | Status: DC
  Administered 2014-04-26 – 2014-04-27 (×2): 1 mg via ORAL
  Filled 2014-04-26 (×5): qty 1

## 2014-04-26 MED ORDER — SODIUM CHLORIDE 0.9 % IV BOLUS (SEPSIS)
500.0000 mL | Freq: Once | INTRAVENOUS | Status: AC
  Administered 2014-04-26: 500 mL via INTRAVENOUS

## 2014-04-26 MED ORDER — ALBUTEROL SULFATE (2.5 MG/3ML) 0.083% IN NEBU
2.5000 mg | INHALATION_SOLUTION | Freq: Four times a day (QID) | RESPIRATORY_TRACT | Status: DC
  Administered 2014-04-26: 2.5 mg via RESPIRATORY_TRACT
  Filled 2014-04-26 (×2): qty 3

## 2014-04-26 MED ORDER — ONDANSETRON HCL 4 MG/2ML IJ SOLN
4.0000 mg | Freq: Four times a day (QID) | INTRAMUSCULAR | Status: DC | PRN
  Administered 2014-04-27: 4 mg via INTRAVENOUS
  Filled 2014-04-26 (×2): qty 2

## 2014-04-26 MED ORDER — BISACODYL 10 MG RE SUPP: 10.0000 mg | Freq: Every day | RECTAL | Status: DC | PRN

## 2014-04-26 MED ORDER — ONDANSETRON HCL 4 MG PO TABS: 4.0000 mg | ORAL_TABLET | Freq: Four times a day (QID) | ORAL | Status: DC | PRN

## 2014-04-26 MED ORDER — ACETAMINOPHEN 650 MG RE SUPP: 650.0000 mg | Freq: Four times a day (QID) | RECTAL | Status: DC | PRN

## 2014-04-26 MED ORDER — ALUM & MAG HYDROXIDE-SIMETH 200-200-20 MG/5ML PO SUSP: 30.0000 mL | Freq: Four times a day (QID) | ORAL | Status: DC | PRN

## 2014-04-26 MED ORDER — SIMVASTATIN 20 MG PO TABS
40.0000 mg | ORAL_TABLET | Freq: Every evening | ORAL | Status: DC
  Administered 2014-04-26 – 2014-04-28 (×3): 40 mg via ORAL
  Filled 2014-04-26 (×3): qty 2

## 2014-04-26 MED ORDER — ACETAMINOPHEN 325 MG PO TABS: 650.0000 mg | ORAL_TABLET | Freq: Four times a day (QID) | ORAL | Status: DC | PRN

## 2014-04-26 MED ORDER — HYDROMORPHONE HCL 1 MG/ML IJ SOLN
0.5000 mg | INTRAMUSCULAR | Status: DC | PRN
  Administered 2014-04-26: 0.5 mg via INTRAVENOUS
  Filled 2014-04-26: qty 1

## 2014-04-26 MED ORDER — PANTOPRAZOLE SODIUM 40 MG PO TBEC
40.0000 mg | DELAYED_RELEASE_TABLET | Freq: Every day | ORAL | Status: DC
  Administered 2014-04-26 – 2014-04-28 (×2): 40 mg via ORAL
  Filled 2014-04-26 (×3): qty 1

## 2014-04-26 MED ORDER — TRAZODONE HCL 50 MG PO TABS
25.0000 mg | ORAL_TABLET | Freq: Every evening | ORAL | Status: DC | PRN
  Administered 2014-04-26 – 2014-04-27 (×2): 25 mg via ORAL
  Filled 2014-04-26 (×2): qty 1

## 2014-04-26 MED ORDER — HYDROCODONE-ACETAMINOPHEN 10-325 MG PO TABS
1.0000 | ORAL_TABLET | Freq: Four times a day (QID) | ORAL | Status: DC | PRN
  Administered 2014-04-26 – 2014-04-28 (×4): 1 via ORAL
  Filled 2014-04-26 (×4): qty 1

## 2014-04-26 MED ORDER — INSULIN ASPART 100 UNIT/ML ~~LOC~~ SOLN
0.0000 [IU] | Freq: Three times a day (TID) | SUBCUTANEOUS | Status: DC
  Administered 2014-04-26 – 2014-04-27 (×2): 7 [IU] via SUBCUTANEOUS
  Administered 2014-04-27: 11 [IU] via SUBCUTANEOUS
  Administered 2014-04-28 (×3): 7 [IU] via SUBCUTANEOUS

## 2014-04-26 MED ORDER — DICYCLOMINE HCL 10 MG PO CAPS
10.0000 mg | ORAL_CAPSULE | Freq: Three times a day (TID) | ORAL | Status: DC
  Administered 2014-04-26 (×2): 10 mg via ORAL
  Filled 2014-04-26 (×2): qty 1

## 2014-04-26 NOTE — ED Notes (Signed)
Pt sent by Dr. Hulan Amato office for admission, pt is to have cholecystectomy tomorrow and hernia repair

## 2014-04-26 NOTE — Progress Notes (Signed)
47 yr. Old W. Female seen as OP for cholecystitis secondary to cholelithiasis.  She was scheduled for OP surgery when she presented to office nausea and vomiting despite diet restriction.  Due to her other medical problems and the likelihood that her diabetes was out of control, she was sent to ER after which she was admitted to hospitalist's service.  Repeat sonogram did not show obvious changes for acute cholecystitis and LFS were grossly WNL.  Abdomen feels much less tender than this AM and blood sugars being followed by med. Service.  Filed Vitals:   04/26/14 1527  BP: 97/56  Pulse: 71  Temp: 98.5 F (36.9 C)  Resp: 20    Will plan on surgery in AM and pt will be placed on parenteral antibiotics.  Procedure and risks already discussed and informed consent obtained.H&P dictated.

## 2014-04-26 NOTE — H&P (Signed)
[facility] History and Physical  Martha [name] [id number] DOB: 12-26-1965 DOA: 04/26/2014  Referring physician:  PCP: Alonza Bogus, MD   Chief Complaint: Persistent abdominal pain/nausea/vomiting.   HPI: Martha [name] is a for a the 48 y.o. female  past medical history that includes diabetes, hypertension, GERD, or bipolar disorder, obstructive sleep apnea, COPD, as as to the emergency department with the chief complaint of persistent abdominal pain/nausea/vomiting. She reports she was sent to the emergency department from Dr. Hulan Amato office as he told her she was "dehydrated". She reports that she has known for several weeks that she needed her gallbladder removed but she was waiting for Dr. Romona Curls to return from vacation. Initial evaluation in the emergency department includes a complete metabolic panel significant for sodium 136 creatinine of 3.43 serum glucose of 284. Ultrasound of the right upper quadrant Cholelithiasis without sonographic features of acute cholecystitis.   Patient reports persistent abdominal pain with nausea and vomiting after eating over the last several weeks. She has seen gastroenterologists had multiple ultrasounds was diagnosed with gallstones. She reports she was waiting for Dr. Romona Curls to come back from vacation to schedule her surgery. The last 2-3 days she experienced worsening and nausea and abdominal pain. She describes the pain as constant sharp and throughout her entire abdominal area. She reports that pain is worse after eating. He denies any diarrhea. She denies fever chills headache dizziness syncope or near-syncope.  At the time of my exam she is afebrile hemodynamically stable and not hypoxic. The emergency department she was given 1 L of normal saline.  Review of Systems:  10 point review of system complete and all systems negative except as indicated in HPI   Past Medical History  Diagnosis Date  . Diabetes mellitus   .  Hypertension   . RLS (restless legs syndrome)   . Acid reflux   . Bipolar 1 disorder   . Depression   . OSA (obstructive sleep apnea)     cpap  . Anemia   . COPD (chronic obstructive pulmonary disease)     bronchitis  . Arthritis   . Degenerative disc disease, thoracic   . Shortness of breath   . Obesity (BMI 30-39.9)    Past Surgical History  Procedure Laterality Date  . Tubal ligation    . Endometrial ablation    . Colonoscopy  06/06/2010    [id number] bleeding secondary to internal hemorrhoids but incomplete evaluation secondary to poor right colon prep/small rectal and sigmoid colon polyps (hyperplastic). PROPOFOL  . Esophagogastroduodenoscopy  06/06/2010    [id number] erythema and edema of body of stomach, with sessile polypoid lesions. bx benign. no h.pylori  . Colonoscopy  May 2013    Dr. Gilliam/[facility]: 5 mm a descending colon polyp, hyperplastic. Adequate bowel prep.  . Flexible sigmoidoscopy N/A 05/17/2013    Dr. Oneida Alar: moderate sized internal hemorrhoids  . Esophagogastroduodenoscopy (egd) with propofol N/A 05/17/2013    Dr. Oneida Alar: normal esophagus, moderate nodular gastritis, negative path, empiric Savary dilation  . Savory dilation N/A 05/17/2013    Procedure: SAVORY DILATION;  Surgeon: [name], MD;  Location: [facility];  Service: Endoscopy;  Laterality: N/A;  14/15/16  . Hemorrhoid banding N/A 05/17/2013    Procedure: HEMORRHOID BANDING;  Surgeon: [name], MD;  Location: [facility];  Service: Endoscopy;  Laterality: N/A;  2 bands placed  . Esophageal biopsy N/A 05/17/2013    Procedure: BIOPSY;  Surgeon: [name], MD;  Location: [facility];  Service: Endoscopy;  Laterality: N/A;  . Irrigation and debridement abscess Right 06/01/2013    Procedure: INCISION AND DRAINAGE AND DEBRIDEMENT ABSCESS RIGHT BREAST;  Surgeon: [name], MD;  Location: [facility];  Service: General;  Laterality: Right;  . Incision and drainage abscess Left 10/11/2013    Procedure:  INCISION AND DRAINAGE AND DEBRIDEMENT LEFT BREAST  ABSCESS;  Surgeon: [name], MD;  Location: [facility];  Service: General;  Laterality: Left;   Social History:  reports that she quit smoking about 6 years ago. Her smoking use included Cigarettes. She has a 37.5 pack-year smoking history. She has quit using smokeless tobacco. She reports that she does not drink alcohol or use illicit drugs.  lives with her boyfriend she is currently disabled independent with ADLs  Allergies  Allergen Reactions  . Codeine Nausea And Vomiting    Tolerates the medication if taken with food    Family History  Problem Relation Age of Onset  . Adopted: Yes     Prior to Admission medications   Medication Sig Start Date End Date Taking? Authorizing Provider  albuterol (PROVENTIL) (2.5 MG/3ML) 0.083% nebulizer solution Take 2.5 mg by nebulization 4 (four) times daily.    Yes Historical Provider, MD  ALPRAZolam Duanne Moron) 1 MG tablet Take 1 mg by mouth 4 (four) times daily.     Yes Historical Provider, MD  amLODipine (NORVASC) 10 MG tablet Take 10 mg by mouth daily.   Yes Historical Provider, MD  dicyclomine (BENTYL) 10 MG capsule Take 1 capsule (10 mg total) by mouth 4 (four) times daily -  before meals and at bedtime. 02/09/14  Yes Orvil Feil, NP  HYDROcodone-acetaminophen (NORCO) 10-325 MG per tablet Take 1 tablet by mouth every 6 (six) hours as needed for pain.   Yes Historical Provider, MD  Insulin Aspart Prot & Aspart (NOVOLOG MIX 70/30 FLEXPEN) (70-30) 100 UNIT/ML Pen Inject 80 Units into the skin 2 (two) times daily.    Yes Historical Provider, MD  lisinopril-hydrochlorothiazide (PRINZIDE,ZESTORETIC) 20-12.5 MG per tablet Take 1 tablet by mouth 2 (two) times daily.    Yes Historical Provider, MD  metFORMIN (GLUCOPHAGE) 1000 MG tablet Take 1,000 mg by mouth 2 (two) times daily with a meal.   Yes Historical Provider, MD  omeprazole (PRILOSEC) 20 MG capsule Take 20 mg by mouth 2 (two) times daily as needed  (acid reflux).    Yes Historical Provider, MD  rOPINIRole (REQUIP) 1 MG tablet Take 1 mg by mouth at bedtime.    Yes Historical Provider, MD  simvastatin (ZOCOR) 40 MG tablet Take 40 mg by mouth every evening.   Yes Historical Provider, MD  UNKNOWN TO PATIENT Apply 1 application topically daily as needed (for irritation (vaginal area) COMPOUND MEDICATION: Ketoconazole mixed with another medication  (name is unknown)).   Yes Historical Provider, MD   Physical Exam: Filed Vitals:   04/26/14 1146 04/26/14 1323  BP: 105/68 114/56  Pulse: 110 93  Temp: 98.3 F (36.8 C)   TempSrc: Oral   Resp: 20 20  Weight: 145.106 kg (319 lb 14.4 oz)   SpO2: 98% 100%    Wt Readings from Last 3 Encounters:  04/26/14 145.106 kg (319 lb 14.4 oz)  04/21/14 146.965 kg (324 lb)  04/10/14 151.955 kg (335 lb)    General:  Appears calm and comfortable obese sitting in chair  Eyes: PERRL, normal lids, irises & conjunctiva ENT: grossly normal hearing, lips & tongue mucous membranes of her mouth are  moist and pink  Neck: no LAD, masses or thyromegaly Cardiovascular: RRR, no m/r/g. No LE edema. Pedal pulses present and palpable  Respiratory: CTA bilaterally, no w/r/r. Normal respiratory effort. Abdomen: Obese soft positive bowel sounds throughout mild tenderness throughout to  Skin: no rash or induration seen on limited exam Musculoskeletal: grossly normal tone BUE/BLE Psychiatric: grossly normal mood and affect, speech fluent and appropriate Neurologic: grossly non-focal. Speech is clear facial symmetry           Labs on Admission:  Basic Metabolic Panel:  Recent Labs Lab 04/21/14 0925 04/26/14 1212  NA 141 136*  K 4.5 4.4  CL 100 96  CO2 27 23  GLUCOSE 124* 284*  BUN 27* 32*  CREATININE 2.36* 3.43*  CALCIUM 11.2* 10.3   Liver Function Tests:  Recent Labs Lab 04/21/14 0925 04/26/14 1212  AST 13 18  ALT 22 27  ALKPHOS 94 96  BILITOT 0.3 0.4  PROT 7.1 7.3  ALBUMIN 3.2* 3.3*    Recent  Labs Lab 04/21/14 0925  AMYLASE 41   No results found for this basename: AMMONIA,  in the last 168 hours CBC:  Recent Labs Lab 04/21/14 0925 04/26/14 1212  WBC 5.9 7.2  NEUTROABS 3.0 3.7  HGB 13.3 13.4  HCT 39.9 39.2  MCV 84.4 83.4  PLT 231 233   Cardiac Enzymes: No results found for this basename: CKTOTAL, CKMB, CKMBINDEX, TROPONINI,  in the last 168 hours  BNP (last 3 results) No results found for this basename: PROBNP,  in the last 8760 hours CBG: No results found for this basename: GLUCAP,  in the last 168 hours  Radiological Exams on Admission: US Abdomen Limited  04/26/2014   CLINICAL DATA:  Right upper quadrant pain  EXAM: US ABDOMEN LIMITED - RIGHT UPPER QUADRANT  COMPARISON:  CT scan from 04/20/2014.  FINDINGS: Gallbladder:  28 mm calcified stone noted in the gallbladder lumen. No gallbladder wall thickening or pericholecystic fluid. The sonographer reports no sonographic Murphy sign.  Common bile duct:  Diameter: Nondilated at 5 mm diameter.  Liver:  Echo chain 8, heterogeneous parenchyma consistent with steatosis.  IMPRESSION: Cholelithiasis without sonographic features of acute cholecystitis.   Electronically Signed   By: Misty Stanley M.D.   On: 04/26/2014 12:55    EKG:   Assessment/Plan Principal Problem:   Acute renal failure : Likely related to dehydration I can do to persistent nausea vomiting the setting of ACE inhibitor and diuretics. Will admit to medical floor. She has been diagnosed with cholelithiasis and scheduled for surgery tomorrow morning per Dr. Romona Curls. We will provide vigorous IV fluids. Hold nephrotoxins. Monitor intake and output. Will recheck in the a.m. Of note chart review EKG some component of chronic kidney disease over the last 6 months. Clearly related to uncontrolled diabetes  Active Problems: Abdominal pain/nausea/vomiting. Ultrasound of the right upper quadrant with cholelithiasis. Scheduled for surgery per Dr. Romona Curls tomorrow. Left  disease within the limits of normal. Will provide pain medicine and anti- emanic as indicated. She will be provided with Mendel Ryder as she requests. N.p.o. past midnight    Cholelithiasis: See #2    Dehydration: Related to #2 in the setting of uncontrolled diabetes. Will provide vigorous IV fluids. Monitor closely and recheck in the a.m.  Uncontrolled diabetes mellitus: Patient states she checks her blood sugars 4 times a day and they usually run between 300 and 500. However she indicates some improvement since she began seeing the pharmacist from [employer]. An  ion gap 17. Will obtain a hemoglobin A1c. Home medications include a log 70 3080 units twice a day. Will hold this for now and use sliding scale insulin for optimal control. She will likely require long-acting as well. She will be n.p.o. past midnight will monitor closely      HYPERLIPIDEMIA: Continue her statin. Obtain a lipid panel    OBESITY: Weight 145.1 kg. Nutritional consult    DEPRESSION: Appears stable at baseline    RESTLESS LEG SYNDROME: States she saw Dr. Luan Pulling 2-3 weeks ago he increased her medication. Since then she's noticed great improvement    HYPERTENSION ; told in the emergency department. I medications include lisinopril and HCTZ as well as Norvasc. I will continue the Norvasc but hold the lisinopril and HCTZ do to problem number 1    GERD: Stable at baseline. Continue PPI    SLEEP APNEA: On CPAP at home. Will request respiratory consult     Umbilical hernia without mention of obstruction or gangrene: Stable at baseline.   Dr. Romona Curls general surgery  Code Status: full DVT Prophylaxis: Family Communication: none present Disposition Plan: surgery in am.   Time spent: 48 minutes  [facility] Pager [phone]

## 2014-04-26 NOTE — ED Provider Notes (Signed)
CSN: [id number]     Arrival date & time 04/26/14  1136 History   First MD Initiated Contact with Patient 04/26/14 1153     Chief Complaint  Patient presents with  . Emesis     (Consider location/radiation/quality/duration/timing/severity/associated sxs/prior Treatment) HPI Patient presents with concerns of abdominal pain, nausea, anorexia by mouth intolerance. Symptoms began approximately 3 days ago, though previously the patient has had similar abdominal pain. No relief with anything over the past few days. No concurrent fever, chills, diarrhea. No confusion, disorientation, chest pain, dyspnea. Patient states that she has been unable to take all medication as directed secondary to her symptoms.  Past Medical History  Diagnosis Date  . Diabetes mellitus   . Hypertension   . RLS (restless legs syndrome)   . Acid reflux   . Bipolar 1 disorder   . Depression   . OSA (obstructive sleep apnea)     cpap  . Anemia   . COPD (chronic obstructive pulmonary disease)     bronchitis  . Arthritis   . Degenerative disc disease, thoracic   . Shortness of breath    Past Surgical History  Procedure Laterality Date  . Tubal ligation    . Endometrial ablation    . Colonoscopy  06/06/2010    [id number] bleeding secondary to internal hemorrhoids but incomplete evaluation secondary to poor right colon prep/small rectal and sigmoid colon polyps (hyperplastic). PROPOFOL  . Esophagogastroduodenoscopy  06/06/2010    [id number] erythema and edema of body of stomach, with sessile polypoid lesions. bx benign. no h.pylori  . Colonoscopy  May 2013    Dr. Gilliam/[facility]: 5 mm a descending colon polyp, hyperplastic. Adequate bowel prep.  . Flexible sigmoidoscopy N/A 05/17/2013    Dr. Oneida Alar: moderate sized internal hemorrhoids  . Esophagogastroduodenoscopy (egd) with propofol N/A 05/17/2013    Dr. Oneida Alar: normal esophagus, moderate nodular gastritis, negative path, empiric Savary dilation  . Savory  dilation N/A 05/17/2013    Procedure: SAVORY DILATION;  Surgeon: [name], MD;  Location: [facility];  Service: Endoscopy;  Laterality: N/A;  14/15/16  . Hemorrhoid banding N/A 05/17/2013    Procedure: HEMORRHOID BANDING;  Surgeon: [name], MD;  Location: [facility];  Service: Endoscopy;  Laterality: N/A;  2 bands placed  . Esophageal biopsy N/A 05/17/2013    Procedure: BIOPSY;  Surgeon: [name], MD;  Location: [facility];  Service: Endoscopy;  Laterality: N/A;  . Irrigation and debridement abscess Right 06/01/2013    Procedure: INCISION AND DRAINAGE AND DEBRIDEMENT ABSCESS RIGHT BREAST;  Surgeon: [name], MD;  Location: [facility];  Service: General;  Laterality: Right;  . Incision and drainage abscess Left 10/11/2013    Procedure: INCISION AND DRAINAGE AND DEBRIDEMENT LEFT BREAST  ABSCESS;  Surgeon: [name], MD;  Location: [facility];  Service: General;  Laterality: Left;   Family History  Problem Relation Age of Onset  . Adopted: Yes   History  Substance Use Topics  . Smoking status: Former Smoker -- 1.50 packs/day for 25 years    Types: Cigarettes    Quit date: 10/23/2007  . Smokeless tobacco: Former Systems developer     Comment: Quit x 6 years ago  . Alcohol Use: No   OB History   Grav Para Term Preterm Abortions TAB SAB Ect Mult Living   2 2 1 1      2      Review of Systems  Constitutional:       Per HPI, otherwise  negative  HENT:       Per HPI, otherwise negative  Respiratory:       Per HPI, otherwise negative  Cardiovascular:       Per HPI, otherwise negative  Gastrointestinal: Positive for vomiting.  Endocrine:       Negative aside from HPI  Genitourinary:       Neg aside from HPI   Musculoskeletal:       Per HPI, otherwise negative  Skin: Negative.   Neurological: Negative for syncope.      Allergies  Codeine  Home Medications   Prior to Admission medications   Medication Sig Start Date End Date Taking? Authorizing Provider  albuterol  (PROVENTIL) (2.5 MG/3ML) 0.083% nebulizer solution Take 2.5 mg by nebulization 4 (four) times daily.    Yes Historical Provider, MD  ALPRAZolam Duanne Moron) 1 MG tablet Take 1 mg by mouth 4 (four) times daily.     Yes Historical Provider, MD  amLODipine (NORVASC) 10 MG tablet Take 10 mg by mouth daily.   Yes Historical Provider, MD  dicyclomine (BENTYL) 10 MG capsule Take 1 capsule (10 mg total) by mouth 4 (four) times daily -  before meals and at bedtime. 02/09/14  Yes Orvil Feil, NP  HYDROcodone-acetaminophen (NORCO) 10-325 MG per tablet Take 1 tablet by mouth every 6 (six) hours as needed for pain.   Yes Historical Provider, MD  Insulin Aspart Prot & Aspart (NOVOLOG MIX 70/30 FLEXPEN) (70-30) 100 UNIT/ML Pen Inject 80 Units into the skin 2 (two) times daily.    Yes Historical Provider, MD  lisinopril-hydrochlorothiazide (PRINZIDE,ZESTORETIC) 20-12.5 MG per tablet Take 1 tablet by mouth 2 (two) times daily.    Yes Historical Provider, MD  metFORMIN (GLUCOPHAGE) 1000 MG tablet Take 1,000 mg by mouth 2 (two) times daily with a meal.   Yes Historical Provider, MD  omeprazole (PRILOSEC) 20 MG capsule Take 20 mg by mouth 2 (two) times daily as needed (acid reflux).    Yes Historical Provider, MD  rOPINIRole (REQUIP) 1 MG tablet Take 1 mg by mouth at bedtime.    Yes Historical Provider, MD  simvastatin (ZOCOR) 40 MG tablet Take 40 mg by mouth every evening.   Yes Historical Provider, MD  UNKNOWN TO PATIENT Apply 1 application topically daily as needed (for irritation (vaginal area) COMPOUND MEDICATION: Ketoconazole mixed with another medication  (name is unknown)).   Yes Historical Provider, MD   BP 114/56  Pulse 93  Temp(Src) 98.3 F (36.8 C) (Oral)  Resp 20  Wt 319 lb 14.4 oz (145.106 kg)  SpO2 100% Physical Exam  Nursing note and vitals reviewed. Constitutional: She is oriented to person, place, and time. She appears well-developed and well-nourished. No distress.  HENT:  Head: Normocephalic and  atraumatic.  Eyes: Conjunctivae and EOM are normal.  Cardiovascular: Regular rhythm.  Tachycardia present.   Pulmonary/Chest: Effort normal and breath sounds normal. No stridor. No respiratory distress.  Abdominal: She exhibits no distension.  .  Large abdomen, non-peritoneal, but with tenderness to palpation throughout the upper abdomen, right upper quadrant  Musculoskeletal: She exhibits no edema.  Neurological: She is alert and oriented to person, place, and time. No cranial nerve deficit.  Skin: Skin is warm and dry.  Psychiatric: She has a normal mood and affect.    ED Course  Procedures (including critical care time) Labs Review Labs Reviewed  COMPREHENSIVE METABOLIC PANEL - Abnormal; Notable for the following:    Sodium 136 (*)    Glucose,  Bld 284 (*)    BUN 32 (*)    Creatinine, Ser 3.43 (*)    Albumin 3.3 (*)    GFR calc non Af Amer 15 (*)    GFR calc Af Amer 17 (*)    Anion gap 17 (*)    All other components within normal limits  CBC WITH DIFFERENTIAL    Imaging Review US Abdomen Limited  04/26/2014   CLINICAL DATA:  Right upper quadrant pain  EXAM: US ABDOMEN LIMITED - RIGHT UPPER QUADRANT  COMPARISON:  CT scan from 04/20/2014.  FINDINGS: Gallbladder:  28 mm calcified stone noted in the gallbladder lumen. No gallbladder wall thickening or pericholecystic fluid. The sonographer reports no sonographic Murphy sign.  Common bile duct:  Diameter: Nondilated at 5 mm diameter.  Liver:  Echo chain 8, heterogeneous parenchyma consistent with steatosis.  IMPRESSION: Cholelithiasis without sonographic features of acute cholecystitis.   Electronically Signed   By: Misty Stanley M.D.   On: 04/26/2014 12:55   Prior to the patient's evaluation, I discussed her care with her referring surgeon. Following all labs, patient appears slightly better. Patient's labs are most notable for acute worsening of her renal dysfunction.  Patient's ultrasound demonstrates cholelithiasis, no  cholecystitis. MDM  Patient presents from home with worsening abdominal pain, nausea, anorexia, vomiting. Patient's labs are notable for acute kidney injury.  Given the patient's recent nausea, vomiting, anorexia, this may be prerenal, though the patient has a baseline poor kidney function. Patient received fluid resuscitation, analgesics, antiemetics in the emergency department was admitted for further evaluation, management, planned operative repair of her hernia and cholecystectomy.     Carmin Muskrat, MD 04/26/14 [phone]

## 2014-04-26 NOTE — H&P (Signed)
Patient seen, independently examined and chart reviewed. I agree with exam, assessment and plan discussed with Dyanne Carrel, NP.  48 year old woman with chronic epigastric and upper abdominal pain with significant weight loss over the last several months. She has been followed recently by her surgeon with plans for elective hernia repair and cholecystectomy October 1. However for the last several days she has had nausea and vomiting, worsening abdominal pain and poor oral intake. She was seen in the office today by her surgeon who sent her to the emergency department for further evaluation.  Currently she is hungry and would like to eat.  PMH Includes diabetes, bipolar 1 disorder, hypertension, obstructive sleep apnea on CPAP, COPD  Objective: Afebrile, vital signs stable. Hypoxia.  Gen. Appears calm, comfortable.  Psych. Alert, speech fluent and clear.  Cardiovascular. Regular rate and rhythm. No murmur, rub or gallop. No lower extremity edema.  Respiratory. Clear to auscultation bilaterally. No wheezes, rales or rhonchi. Normal respiratory effort.  Abdomen. Obese, soft. Mildly tender. The ventral hernia noted, soft, easily reducible.   BUN and creatinine elevated above baseline 32/3.43.  CBC unremarkable  Acute on chronic nausea vomiting abdominal pain. Plan as per general surgery to address this. Acute component may be related to poor oral intake and hyperglycemia.  Plan IV hydration for acute renal failure secondary to poor oral intake, expect improvement in blood sugars with hydration. Resistant sliding scale insulin. Her diabetes is poorly controlled at home, at baseline running above 300. She did experience some hypoglycemia at home when she was unable to eat.  Murray Hodgkins, MD [facility] [phone]

## 2014-04-26 NOTE — Progress Notes (Addendum)
Patient's blood pressure is 90/55mg Hg, and patient states she is feeling light headed. Dr. Sarajane Jews made aware. He ordered 550mL bolus of 0.9%NS. Infusing now. Will continue to monitor. Update 04/26/2014 @ 1924: Bolus complete. Patient's BP 106/23mmHg. Patient resting comfortably. Information passed on to evening shift RN in report.

## 2014-04-27 ENCOUNTER — Encounter (HOSPITAL_COMMUNITY): Payer: Self-pay | Admitting: *Deleted

## 2014-04-27 ENCOUNTER — Encounter (HOSPITAL_COMMUNITY): Payer: Medicaid Other | Admitting: Anesthesiology

## 2014-04-27 ENCOUNTER — Ambulatory Visit (HOSPITAL_COMMUNITY): Admission: RE | Admit: 2014-04-27 | Payer: Medicaid Other | Source: Ambulatory Visit | Admitting: General Surgery

## 2014-04-27 ENCOUNTER — Encounter (HOSPITAL_COMMUNITY)
Admission: EM | Disposition: A | Discharge status: Home Health | Payer: Self-pay | Source: Home / Self Care | Attending: Pulmonary Disease

## 2014-04-27 ENCOUNTER — Inpatient Hospital Stay (HOSPITAL_COMMUNITY): Payer: Medicaid Other | Admitting: Anesthesiology

## 2014-04-27 HISTORY — PX: CHOLECYSTECTOMY: SHX55

## 2014-04-27 LAB — BASIC METABOLIC PANEL
ANION GAP: 14 (ref 5–15)
BUN: 29 mg/dL — ABNORMAL HIGH (ref 6–23)
CO2: 25 mEq/L (ref 19–32)
Calcium: 9.2 mg/dL (ref 8.4–10.5)
Chloride: 104 mEq/L (ref 96–112)
Creatinine, Ser: 3.08 mg/dL — ABNORMAL HIGH (ref 0.50–1.10)
GFR, EST AFRICAN AMERICAN: 20 mL/min — AB (ref >90)
GFR, EST NON AFRICAN AMERICAN: 17 mL/min — AB (ref >90)
Glucose, Bld: 229 mg/dL — ABNORMAL HIGH (ref 70–99)
POTASSIUM: 4.3 meq/L (ref 3.7–5.3)
Sodium: 143 mEq/L (ref 137–147)

## 2014-04-27 LAB — GLUCOSE, CAPILLARY
Glucose-Capillary: 254 mg/dL — ABNORMAL HIGH (ref 70–99)
Glucose-Capillary: 276 mg/dL — ABNORMAL HIGH (ref 70–99)
Glucose-Capillary: 220 mg/dL — ABNORMAL HIGH (ref 70–99)
Glucose-Capillary: 242 mg/dL — ABNORMAL HIGH (ref 70–99)
Glucose-Capillary: 192 mg/dL — ABNORMAL HIGH (ref 70–99)

## 2014-04-27 LAB — CBC
HCT: 34.2 % — ABNORMAL LOW (ref 36.0–46.0)
Hemoglobin: 11.3 g/dL — ABNORMAL LOW (ref 12.0–15.0)
MCH: 28 pg (ref 26.0–34.0)
MCHC: 33 g/dL (ref 30.0–36.0)
MCV: 84.9 fL (ref 78.0–100.0)
Platelets: 160 10*3/uL (ref 150–400)
RBC: 4.03 MIL/uL (ref 3.87–5.11)
RDW: 13.9 % (ref 11.5–15.5)
WBC: 4.9 10*3/uL (ref 4.0–10.5)

## 2014-04-27 LAB — HEMOGLOBIN A1C
HEMOGLOBIN A1C: 11.7 % — AB (ref <5.7)
Mean Plasma Glucose: 289 mg/dL — ABNORMAL HIGH (ref <117)

## 2014-04-27 SURGERY — LAPAROSCOPIC CHOLECYSTECTOMY
Anesthesia: General | Site: Abdomen

## 2014-04-27 MED ORDER — MIDAZOLAM HCL 2 MG/2ML IJ SOLN
INTRAMUSCULAR | Status: AC
  Filled 2014-04-27: qty 2

## 2014-04-27 MED ORDER — WATER FOR IRRIGATION, STERILE IR SOLN
Status: DC | PRN
  Administered 2014-04-27: 2000 mL

## 2014-04-27 MED ORDER — BUPIVACAINE HCL (PF) 0.5 % IJ SOLN
INTRAMUSCULAR | Status: AC
  Filled 2014-04-27: qty 30

## 2014-04-27 MED ORDER — FENTANYL CITRATE 0.05 MG/ML IJ SOLN
INTRAMUSCULAR | Status: AC
  Filled 2014-04-27: qty 2

## 2014-04-27 MED ORDER — GLYCOPYRROLATE 0.2 MG/ML IJ SOLN
INTRAMUSCULAR | Status: DC | PRN
  Administered 2014-04-27: 0.6 mg via INTRAVENOUS

## 2014-04-27 MED ORDER — NEOSTIGMINE METHYLSULFATE 10 MG/10ML IV SOLN
INTRAVENOUS | Status: DC | PRN
  Administered 2014-04-27: 5 mg via INTRAVENOUS

## 2014-04-27 MED ORDER — ONDANSETRON HCL 4 MG/2ML IJ SOLN
4.0000 mg | Freq: Once | INTRAMUSCULAR | Status: AC | PRN
  Administered 2014-04-27: 4 mg via INTRAVENOUS

## 2014-04-27 MED ORDER — DEXTROSE 5 % IV SOLN
INTRAVENOUS | Status: AC
  Filled 2014-04-27: qty 10

## 2014-04-27 MED ORDER — SODIUM CHLORIDE 0.9 % IR SOLN
Status: DC | PRN
  Administered 2014-04-27: 3000 mL

## 2014-04-27 MED ORDER — LACTATED RINGERS IV SOLN: INTRAVENOUS | Status: DC | PRN

## 2014-04-27 MED ORDER — HEMOSTATIC AGENTS (NO CHARGE) OPTIME
TOPICAL | Status: DC | PRN
  Administered 2014-04-27: 2 via TOPICAL

## 2014-04-27 MED ORDER — SODIUM CHLORIDE 0.9 % IV SOLN
INTRAVENOUS | Status: DC | PRN
  Administered 2014-04-27 (×3): via INTRAVENOUS

## 2014-04-27 MED ORDER — LIDOCAINE HCL (CARDIAC) 20 MG/ML IV SOLN
INTRAVENOUS | Status: DC | PRN
  Administered 2014-04-27: 50 mg via INTRAVENOUS

## 2014-04-27 MED ORDER — SODIUM CHLORIDE 0.9 % IV SOLN
INTRAVENOUS | Status: DC
  Administered 2014-04-27: 09:00:00 via INTRAVENOUS

## 2014-04-27 MED ORDER — MIDAZOLAM HCL 2 MG/2ML IJ SOLN
1.0000 mg | INTRAMUSCULAR | Status: DC | PRN
  Administered 2014-04-27 (×2): 2 mg via INTRAVENOUS
  Filled 2014-04-27: qty 2

## 2014-04-27 MED ORDER — MORPHINE SULFATE 2 MG/ML IJ SOLN
1.0000 mg | INTRAMUSCULAR | Status: DC | PRN
  Administered 2014-04-27 – 2014-04-28 (×5): 1 mg via INTRAVENOUS
  Filled 2014-04-27 (×6): qty 1

## 2014-04-27 MED ORDER — ROCURONIUM BROMIDE 100 MG/10ML IV SOLN
INTRAVENOUS | Status: DC | PRN
  Administered 2014-04-27 (×6): 10 mg via INTRAVENOUS
  Administered 2014-04-27: 20 mg via INTRAVENOUS

## 2014-04-27 MED ORDER — FENTANYL CITRATE 0.05 MG/ML IJ SOLN
25.0000 ug | INTRAMUSCULAR | Status: DC | PRN
  Administered 2014-04-27 (×3): 50 ug via INTRAVENOUS

## 2014-04-27 MED ORDER — FENTANYL CITRATE 0.05 MG/ML IJ SOLN
INTRAMUSCULAR | Status: DC | PRN
  Administered 2014-04-27: 100 ug via INTRAVENOUS
  Administered 2014-04-27 (×8): 50 ug via INTRAVENOUS

## 2014-04-27 MED ORDER — SUCCINYLCHOLINE CHLORIDE 20 MG/ML IJ SOLN
INTRAMUSCULAR | Status: DC | PRN
  Administered 2014-04-27: 120 mg via INTRAVENOUS

## 2014-04-27 MED ORDER — ONDANSETRON HCL 4 MG/2ML IJ SOLN
4.0000 mg | Freq: Once | INTRAMUSCULAR | Status: AC
  Administered 2014-04-27: 4 mg via INTRAVENOUS

## 2014-04-27 MED ORDER — DEXTROSE 5 % IV SOLN: 2.0000 g | Freq: Once | INTRAVENOUS | Status: DC

## 2014-04-27 MED ORDER — ROPINIROLE HCL 1 MG PO TABS
ORAL_TABLET | ORAL | Status: AC
  Filled 2014-04-27: qty 1

## 2014-04-27 MED ORDER — PROPOFOL 10 MG/ML IV BOLUS
INTRAVENOUS | Status: DC | PRN
  Administered 2014-04-27: 150 mg via INTRAVENOUS

## 2014-04-27 MED ORDER — SODIUM CHLORIDE 0.9 % IR SOLN
Status: DC | PRN
  Administered 2014-04-27: 1000 mL

## 2014-04-27 MED ORDER — ONDANSETRON HCL 4 MG/2ML IJ SOLN
INTRAMUSCULAR | Status: AC
Start: 2014-04-27 — End: 2014-04-27
  Filled 2014-04-27: qty 2

## 2014-04-27 SURGICAL SUPPLY — 82 items
APPLICATOR COTTON TIP 6IN STRL (MISCELLANEOUS) IMPLANT
APPLIER CLIP LAPSCP 10X32 DD (CLIP) ×3 IMPLANT
ATTRACTOMAT 16X20 MAGNETIC DRP (DRAPES) IMPLANT
BAG HAMPER (MISCELLANEOUS) ×3 IMPLANT
BLADE 15 SAFETY STRL DISP (BLADE) ×3 IMPLANT
CLEANER TIP ELECTROSURG 2X2 (MISCELLANEOUS) ×3 IMPLANT
CLOTH BEACON ORANGE TIMEOUT ST (SAFETY) ×3 IMPLANT
COVER LIGHT HANDLE STERIS (MISCELLANEOUS) ×6 IMPLANT
DECANTER SPIKE VIAL GLASS SM (MISCELLANEOUS) ×3 IMPLANT
DISSECTOR BLUNT TIP ENDO 5MM (MISCELLANEOUS) ×3 IMPLANT
DRAPE WARM FLUID 44X44 (DRAPE) IMPLANT
DRSG TEGADERM 2-3/8X2-3/4 SM (GAUZE/BANDAGES/DRESSINGS) ×9 IMPLANT
DRSG TEGADERM 4X4.75 (GAUZE/BANDAGES/DRESSINGS) ×12 IMPLANT
ELECT BLADE 6 FLAT ULTRCLN (ELECTRODE) IMPLANT
ELECT REM PT RETURN 9FT ADLT (ELECTROSURGICAL) ×3
ELECTRODE REM PT RTRN 9FT ADLT (ELECTROSURGICAL) ×2 IMPLANT
EVACUATOR DRAINAGE 10X20 100CC (DRAIN) ×2 IMPLANT
EVACUATOR SILICONE 100CC (DRAIN) ×1
FILTER SMOKE EVAC LAPAROSHD (FILTER) ×3 IMPLANT
FORMALIN 10 PREFIL 120ML (MISCELLANEOUS) ×3 IMPLANT
GAUZE SPONGE 4X4 12PLY STRL (GAUZE/BANDAGES/DRESSINGS) ×3 IMPLANT
GLOVE ECLIPSE 6.5 STRL STRAW (GLOVE) ×6 IMPLANT
GLOVE INDICATOR 7.0 STRL GRN (GLOVE) ×6 IMPLANT
GLOVE SKINSENSE NS SZ7.0 (GLOVE) ×1
GLOVE SKINSENSE STRL SZ7.0 (GLOVE) ×2 IMPLANT
GOWN STRL REUS W/TWL LRG LVL3 (GOWN DISPOSABLE) ×9 IMPLANT
HEMOSTAT SURGICEL 4X8 (HEMOSTASIS) ×6 IMPLANT
INST SET LAPROSCOPIC AP (KITS) ×3 IMPLANT
INST SET MINOR GENERAL (KITS) ×3 IMPLANT
IV NS IRRIG 3000ML ARTHROMATIC (IV SOLUTION) ×3 IMPLANT
KIT ROOM TURNOVER APOR (KITS) ×3 IMPLANT
MANIFOLD NEPTUNE II (INSTRUMENTS) ×3 IMPLANT
NEEDLE INSUFFLATION 14GA 120MM (NEEDLE) ×3 IMPLANT
NEEDLE INSUFFLATION 150MM (ENDOMECHANICALS) ×3 IMPLANT
NS IRRIG 1000ML POUR BTL (IV SOLUTION) ×3 IMPLANT
PACK LAP CHOLE LZT030E (CUSTOM PROCEDURE TRAY) ×3 IMPLANT
PACK MINOR (CUSTOM PROCEDURE TRAY) IMPLANT
PAD ARMBOARD 7.5X6 YLW CONV (MISCELLANEOUS) ×3 IMPLANT
PENCIL HANDSWITCHING (ELECTRODE) IMPLANT
POUCH SPECIMEN RETRIEVAL 10MM (ENDOMECHANICALS) ×3 IMPLANT
RETRACTOR LAPSCP 12X46 CVD (ENDOMECHANICALS) ×2 IMPLANT
RTRCTR LAPSCP 12X46 CVD (ENDOMECHANICALS) ×3
SET BASIN LINEN APH (SET/KITS/TRAYS/PACK) ×3 IMPLANT
SET TUBE IRRIG SUCTION NO TIP (IRRIGATION / IRRIGATOR) ×3 IMPLANT
SLEEVE ENDOPATH XCEL 5M (ENDOMECHANICALS) ×6 IMPLANT
SOL PREP PROV IODINE SCRUB 4OZ (MISCELLANEOUS) ×3 IMPLANT
SPONGE DRAIN TRACH 4X4 STRL 2S (GAUZE/BANDAGES/DRESSINGS) ×3 IMPLANT
SPONGE GAUZE 4X4 12PLY (GAUZE/BANDAGES/DRESSINGS) ×3 IMPLANT
SPONGE INTESTINAL PEANUT (DISPOSABLE) ×6 IMPLANT
SPONGE LAP 18X18 X RAY DECT (DISPOSABLE) IMPLANT
STAPLER VISISTAT 35W (STAPLE) ×3 IMPLANT
SUT ETHILON 3 0 FSL (SUTURE) ×3 IMPLANT
SUT PROLENE 0 CT 1 CR/8 (SUTURE) IMPLANT
SUT PROLENE 1 CT 1 30 (SUTURE) IMPLANT
SUT PROLENE 4 0 PS 2 18 (SUTURE) IMPLANT
SUT PROLENE MO 6 BLUE #0 30IN (SUTURE) IMPLANT
SUT SILK 2 0 (SUTURE)
SUT SILK 2 0 SH (SUTURE) IMPLANT
SUT SILK 2-0 18XBRD TIE 12 (SUTURE) IMPLANT
SUT SILK 3 0 SH CR/8 (SUTURE) IMPLANT
SUT VIC AB 0 CT1 27 (SUTURE)
SUT VIC AB 0 CT1 27XBRD ANTBC (SUTURE) IMPLANT
SUT VIC AB 0 CT1 27XCR 8 STRN (SUTURE) IMPLANT
SUT VIC AB 3-0 SH 27 (SUTURE)
SUT VIC AB 3-0 SH 27X BRD (SUTURE) IMPLANT
SUT VIC AB 5-0 P-3 18X BRD (SUTURE) IMPLANT
SUT VIC AB 5-0 P3 18 (SUTURE)
SUT VICRYL 0 UR6 27IN ABS (SUTURE) ×12 IMPLANT
SUT VICRYL AB 3 0 TIES (SUTURE) IMPLANT
SUT VICRYL AB 3-0 BRD CT 36IN (SUTURE) IMPLANT
SYR BULB IRRIGATION 50ML (SYRINGE) IMPLANT
SYR CONTROL 10ML LL (SYRINGE) ×3 IMPLANT
TOWEL OR 17X26 4PK STRL BLUE (TOWEL DISPOSABLE) ×3 IMPLANT
TRAY FOLEY CATH 16FR SILVER (SET/KITS/TRAYS/PACK) ×3 IMPLANT
TROCAR 12M 150ML BLUNT (TROCAR) ×3 IMPLANT
TROCAR ENDO BLADELESS 11MM (ENDOMECHANICALS) ×3 IMPLANT
TROCAR XCEL NON-BLD 5MMX100MML (ENDOMECHANICALS) ×3 IMPLANT
TROCAR XCEL UNIV SLVE 11M 100M (ENDOMECHANICALS) IMPLANT
TUBING INSUFFLATION HIGH FLOW (TUBING) ×3 IMPLANT
WARMER LAPAROSCOPE (MISCELLANEOUS) ×3 IMPLANT
WATER STERILE IRR 1000ML POUR (IV SOLUTION) ×6 IMPLANT
YANKAUER SUCT BULB TIP 10FT TU (MISCELLANEOUS) IMPLANT

## 2014-04-27 NOTE — Anesthesia Preprocedure Evaluation (Signed)
Anesthesia Evaluation  Patient identified by MRN, date of birth, ID band Patient awake    Reviewed: Allergy & Precautions, H&P , NPO status , Patient's Chart, lab work & pertinent test results, reviewed documented beta blocker date and time   Airway Mallampati: II TM Distance: >3 FB Neck ROM: Full    Dental  (+) Teeth Intact, Poor Dentition, Chipped,    Pulmonary sleep apnea and Continuous Positive Airway Pressure Ventilation , COPDformer smoker,  breath sounds clear to auscultation        Cardiovascular hypertension, Pt. on medications Rhythm:Regular Rate:Normal     Neuro/Psych PSYCHIATRIC DISORDERS Depression Bipolar Disorder    GI/Hepatic GERD-  Medicated and Controlled,  Endo/Other  diabetes, Poorly Controlled, Type 2, Insulin DependentMorbid obesity  Renal/GU Renal disease     Musculoskeletal  (+) Arthritis -,   Abdominal   Peds  Hematology  (+) anemia ,   Anesthesia Other Findings   Reproductive/Obstetrics                           Anesthesia Physical Anesthesia Plan  ASA: III  Anesthesia Plan: General   Post-op Pain Management:    Induction: Intravenous, Rapid sequence and Cricoid pressure planned  Airway Management Planned: Oral ETT  Additional Equipment:   Intra-op Plan:   Post-operative Plan: Extubation in OR  Informed Consent: I have reviewed the patients History and Physical, chart, labs and discussed the procedure including the risks, benefits and alternatives for the proposed anesthesia with the patient or authorized representative who has indicated his/her understanding and acceptance.   Dental advisory given  Plan Discussed with:   Anesthesia Plan Comments: (Hyperglycemia  is slowly improving will continue to monitor.)        Anesthesia Quick Evaluation

## 2014-04-27 NOTE — Care Management Note (Unsigned)
    Page 1 of 1   04/28/2014     5:09:09 PM CARE MANAGEMENT NOTE 04/28/2014  Patient:  Martha [name], Martha [name]   Account Number:  [id number]  Date Initiated:  04/27/2014  Documentation initiated by:  Vladimir Creeks  Subjective/Objective Assessment:   Admitted with cholelilithesis and umbilical hernia, and is in surgery today     Action/Plan:   will follow for needs   Anticipated DC Date:  04/29/2014   Anticipated DC Plan:  [place]  CM consult      Choice offered to / List presented to:             Status of service:  In process, will continue to follow Medicare Important Message given?   (If response is "NO", the following Medicare IM given date fields will be blank) Date Medicare IM given:   Medicare IM given by:   Date Additional Medicare IM given:   Additional Medicare IM given by:    Discharge Disposition:    Per UR Regulation:  Reviewed for med. necessity/level of care/duration of stay  If discussed at [place] of Stay Meetings, dates discussed:    Comments:  04/28/14 1700 Vladimir Creeks RN/CM pt to return home in am 04/27/14 1600 Wilian Kwong Rn/CM

## 2014-04-27 NOTE — Consult Note (Signed)
NAME:  Martha [name], Martha [name] NO.:  [id number]  MEDICAL RECORD NO.:  [id number]  LOCATION:  A334                          FACILITY:  [facility]  PHYSICIAN:  Felicie Morn, M.D. DATE OF BIRTH:  1966-07-02  DATE OF CONSULTATION:  04/26/2014 DATE OF DISCHARGE:                                CONSULTATION   NOTE:  Surgery was asked to see this 48 year old obese white female, who had recurrent episodes of right upper quadrant pain, nausea, and vomiting at least over the last 2 months.  She was seen in the emergency room.  She came to my office with a history of having been seen in the emergency room and at that time she was seen, she had some residual right upper quadrant pain, but she was feeling somewhat better. Sonogram was done and this showed the presence of 1 large stone.  No particular thickening was noted and liver function studies were grossly within normal limits.  She was scheduled for outpatient surgery.  She then returned to my office today with after being seen on April 17, 2014.  She returned to my office today which is April 26, 2014, with an exacerbation of the symptoms complaining of nausea and vomiting and not being able to hold any food down despite the fact that she had been placed on basically a clear liquid and restrictive diet.  I suspected she had some problems as well with her blood sugar.  I sent her to the emergency room after consulting with a hospitalist to admit her on their service to make sure that her diabetes and her other medical problems were addressed prior to surgery.  She was admitted to the Hospitalist Service, and I saw her in consult in the hospital and she basically is feeling a lot better and her blood sugar was being followed by the hospitalist and apparently was under control.  She was seen in the emergency room initially with a blood sugar in the 290.  PAST MEDICAL HISTORY:  Includes a history of diabetes,  insulin dependent, hypercholesterolemia, hypertension.  She has had a past history of bipolar disease and sleep apnea with COPD.  PAST SURGERY:  In 1992, she had a tubal ligation.  SOCIAL HISTORY:  She is disabled for her chronic back pain essentially and for her arthritic complaints and her obesity.  MEDICATIONS:  See medication list.  She has some intolerance to codeine. I do not know that this is an allergy per Se.  She states that codeine makes her nauseated.  She does not drink and she does not smoke.  PHYSICAL EXAMINATION:  GENERAL:  She is in no acute distress. VITAL SIGNS:  She is 5 feet 8 inches, weighs 312 pounds.  Temperature is 98.1, pulse rate is 88, respirations 12, and blood pressure was 116/74 when seen in my office. HEENT:  Head is normocephalic.  Eyes, extraocular movements are intact. Pupils are round and reactive to light and accommodation.  There is no conjunctival pallor or scleral injection.  Sclerae has a normal tincture.  She has no adenopathy.  No jugular vein distention.  No thyromegaly.  The patient has a tongue and eyebrow piercings.  CHEST:  Clear both anterior and posterior to auscultation. HEART:  Regular rhythm. BREASTS:  Breasts and axillae are without masses. ABDOMEN:  Soft.  There was some question of whether she had an umbilical hernia.  She had a CT scan, which did not reveal an umbilical hernia. RECTAL:  Deferred.  She had some mild right upper quadrant pain without rebound.  No visceromegaly was appreciated.  No hernias are appreciated. She complains of left ankle pain mostly  as well as back pain.  REVIEW OF SYSTEMS:  NEURO:  The patient has a history of bipolar disease.  No history of migraines or seizures.  ENDOCRINE:  She is an insulin-dependent diabetic.  She has no past history of thyroid or adrenal problems.  CARDIOPULMONARY:  She has history of hypertension, hypercholesterolemia, COPD and sleep apnea for which she uses CPAP machine.   MUSCULOSKELETAL:  She is obese.  She has restless legs syndrome.  She has chronic back pain and ankle pain, particularly on the left side. OB/GYN history:  She is a gravida 2, para 2, abortus 0, cesarean 0 female, who has no known family history of breast cancer, however, she is adopted.  Her last menstrual period was in 2011.  Mammogram, last mammogram was 2013.  GI:  No history of hepatitis, constipation, bright red rectal bleeding, or melena.  No history of irritable bowel syndrome or inflammatory bowel disease.  No unexplained weight loss.  The patient is obese.  Her last colonoscopy was 2014.  GU:  She has had history of urinary tract infections in the past.  Currently, she has no history of frequency, dysuria, or kidney stones.  REVIEW OF HISTORY AND PHYSICAL:  Therefore, Martha [name] is a 48 year old female with symptoms of biliary colic.  She has been admitted to the hospital for control of her diabetes.  We will plan on surgery for her as planned for in the morning.  I have reviewed a repeat liver function studies and her lab work.  It seems grossly within normal limits except for her elevated blood sugars and her repeat sonogram did not reveal any acute changes suggesting acute cholecystitis with thickening her pericystic fluid.  We planned for surgery and I discussed this in detail with her in my office.  We discussed complications not limited to, but including bleeding, infection, damage to bile ducts, perforation of organs, transitory diarrhea, and the possibility that open surgery might be required.  Informed consent was obtained.     Felicie Morn, M.D.     WB/MEDQ  D:  04/26/2014  T:  04/27/2014  Job:  [id number]  cc:   Percell Miller L. Luan Pulling, M.D. Fax: [phone]

## 2014-04-27 NOTE — Transfer of Care (Signed)
Immediate Anesthesia Transfer of Care Note  Patient: Martha [name]  Procedure(s) Performed: Procedure(s): LAPAROSCOPIC CHOLECYSTECTOMY (N/A)  Patient Location: PACU  Anesthesia Type:General  Level of Consciousness: awake, sedated and patient cooperative  Airway & Oxygen Therapy: Patient Spontanous Breathing and Patient connected to face mask oxygen  Post-op Assessment: Report given to PACU RN and Post -op Vital signs reviewed and stable  Post vital signs: Reviewed and stable  Complications: No apparent anesthesia complications

## 2014-04-27 NOTE — Anesthesia Postprocedure Evaluation (Signed)
  Anesthesia Post-op Note  Patient: Martha [name]  Procedure(s) Performed: Procedure(s): LAPAROSCOPIC CHOLECYSTECTOMY (N/A)  Patient Location: PACU  Anesthesia Type:General  Level of Consciousness: awake, sedated and patient cooperative  Airway and Oxygen Therapy: Patient Spontanous Breathing and Patient connected to face mask oxygen  Post-op Pain: mild  Post-op Assessment: Post-op Vital signs reviewed, Patient's Cardiovascular Status Stable, Respiratory Function Stable, Patent Airway, No signs of Nausea or vomiting and Pain level controlled  Post-op Vital Signs: Reviewed and stable.  Last Vitals:  Filed Vitals:   04/27/14 0915  BP: 122/65  Pulse:   Temp:   Resp: 33    Complications: No apparent anesthesia complications

## 2014-04-27 NOTE — Anesthesia Procedure Notes (Signed)
Procedure Name: Intubation Date/Time: 04/27/2014 9:44 AM Performed by: Andree Elk, AMY A Pre-anesthesia Checklist: Patient identified, Patient being monitored, Timeout performed, Emergency Drugs available and Suction available Patient Re-evaluated:Patient Re-evaluated prior to inductionOxygen Delivery Method: Circle System Utilized Preoxygenation: Pre-oxygenation with 100% oxygen Intubation Type: IV induction, Rapid sequence and Cricoid Pressure applied Laryngoscope Size: 3 and Miller Grade View: Grade I Tube type: Oral Tube size: 7.0 mm Number of attempts: 1 Airway Equipment and Method: stylet Placement Confirmation: ETT inserted through vocal cords under direct vision,  positive ETCO2 and breath sounds checked- equal and bilateral Secured at: 21 cm Tube secured with: Tape Dental Injury: Teeth and Oropharynx as per pre-operative assessment

## 2014-04-27 NOTE — Progress Notes (Signed)
48 yr old W obese female with cholecystitis due to cholelithiasis for lap surgery if feasible.  Procedure and risks addressed.    Blood sugars being followed by med service.   BS 254, anesthesia aware.  Filed Vitals:   04/27/14 0915  BP: 122/65  Pulse:   Temp:   Resp: 33  pulse 109, temp 98.0, O2 sat 97% RA

## 2014-04-27 NOTE — Progress Notes (Signed)
Post OP Check  Filed Vitals:   04/27/14 1435  BP: 184/86  Pulse: 101  Temp: 98.5 F (36.9 C)  Resp: 20   Awake and alert.  Pt has voided.  Dressings dry and in tact,  Murky drainage from surgicel.  Pain under control.  Last BS 220, this will be followed by med service.  Doing well post op.

## 2014-04-27 NOTE — Brief Op Note (Signed)
04/26/2014 - 04/27/2014  12:31 PM  PATIENT:  Martha [name]  48 y.o. female  PRE-OPERATIVE DIAGNOSIS:  cholelithiasis, umbilical hernia  POST-OPERATIVE DIAGNOSIS:  cholelithiasis, cholelithiasis  PROCEDURE:  Procedure(s): LAPAROSCOPIC CHOLECYSTECTOMY (N/A)  SURGEON:  Surgeon(s) and Role:    * [name], MD - Primary  PHYSICIAN ASSISTANT:   ASSISTANTS: none   ANESTHESIA:   general  EBL:  Total I/O In: 2300 [I.V.:2300] Out: 400 [Urine:400]  BLOOD ADMINISTERED:none  DRAINS: (in the liver bed.) Jackson-Pratt drain(s) with closed bulb suction in the in the liver bed.   LOCAL MEDICATIONS USED:  NONE  SPECIMEN:  Source of Specimen:  gall bladder and stone.  DISPOSITION OF SPECIMEN:  PATHOLOGY  COUNTS:  YES  TOURNIQUET:  * No tourniquets in log *  DICTATION: .Other Dictation: Dictation Number OR dictation#  [id number].  PLAN OF CARE: Admit to inpatient   PATIENT DISPOSITION:  PACU - hemodynamically stable.   Delay start of Pharmacological VTE agent (>24hrs) due to surgical blood loss or risk of bleeding: not applicable

## 2014-04-28 ENCOUNTER — Encounter (HOSPITAL_COMMUNITY): Payer: Self-pay | Admitting: General Surgery

## 2014-04-28 LAB — GLUCOSE, CAPILLARY
GLUCOSE-CAPILLARY: 208 mg/dL — AB (ref 70–99)
Glucose-Capillary: 234 mg/dL — ABNORMAL HIGH (ref 70–99)
Glucose-Capillary: 227 mg/dL — ABNORMAL HIGH (ref 70–99)

## 2014-04-28 LAB — BASIC METABOLIC PANEL
Anion gap: 13 (ref 5–15)
BUN: 19 mg/dL (ref 6–23)
CHLORIDE: 108 meq/L (ref 96–112)
CO2: 24 mEq/L (ref 19–32)
Calcium: 8.8 mg/dL (ref 8.4–10.5)
Creatinine, Ser: 2.2 mg/dL — ABNORMAL HIGH (ref 0.50–1.10)
GFR calc Af Amer: 30 mL/min — ABNORMAL LOW (ref >90)
GFR calc non Af Amer: 26 mL/min — ABNORMAL LOW (ref >90)
Glucose, Bld: 263 mg/dL — ABNORMAL HIGH (ref 70–99)
POTASSIUM: 4.4 meq/L (ref 3.7–5.3)
Sodium: 145 mEq/L (ref 137–147)

## 2014-04-28 LAB — CBC WITH DIFFERENTIAL/PLATELET
BASOS PCT: 1 % (ref 0–1)
Basophils Absolute: 0 10*3/uL (ref 0.0–0.1)
Eosinophils Absolute: 0.1 10*3/uL (ref 0.0–0.7)
Eosinophils Relative: 1 % (ref 0–5)
HCT: 33.7 % — ABNORMAL LOW (ref 36.0–46.0)
HEMOGLOBIN: 11.1 g/dL — AB (ref 12.0–15.0)
LYMPHS ABS: 1.8 10*3/uL (ref 0.7–4.0)
Lymphocytes Relative: 32 % (ref 12–46)
MCH: 28.2 pg (ref 26.0–34.0)
MCHC: 32.9 g/dL (ref 30.0–36.0)
MCV: 85.8 fL (ref 78.0–100.0)
MONOS PCT: 6 % (ref 3–12)
Monocytes Absolute: 0.4 10*3/uL (ref 0.1–1.0)
NEUTROS ABS: 3.4 10*3/uL (ref 1.7–7.7)
Neutrophils Relative %: 60 % (ref 43–77)
PLATELETS: 165 10*3/uL (ref 150–400)
RBC: 3.93 MIL/uL (ref 3.87–5.11)
RDW: 13.8 % (ref 11.5–15.5)
WBC: 5.7 10*3/uL (ref 4.0–10.5)

## 2014-04-28 LAB — HEPATIC FUNCTION PANEL
ALT: 43 U/L — ABNORMAL HIGH (ref 0–35)
AST: 40 U/L — AB (ref 0–37)
Albumin: 2.6 g/dL — ABNORMAL LOW (ref 3.5–5.2)
Alkaline Phosphatase: 83 U/L (ref 39–117)
BILIRUBIN TOTAL: 0.2 mg/dL — AB (ref 0.3–1.2)
Bilirubin, Direct: <0.2 mg/dL (ref 0.0–0.3)
Total Protein: 6.1 g/dL (ref 6.0–8.3)

## 2014-04-28 NOTE — Care Management Utilization Note (Signed)
UR completed 

## 2014-04-28 NOTE — Progress Notes (Signed)
Brought Advance Directives information, discussed process and helped her complete the documents. Waiting on a notary to help complete. A copy will be placed in her chart.

## 2014-04-28 NOTE — Progress Notes (Signed)
Subjective: She says she feels better. She's been getting up some. She has no new complaints. She does have some abdominal pain which is incisional. Her breathing is okay. Her blood sugar is better  Objective: Vital signs in last 24 hours: Temp:  [97.6 F (36.4 C)-99.5 F (37.5 C)] 98.7 F (37.1 C) (10/02 0544) Pulse Rate:  [86-102] 100 (10/02 0544) Resp:  [17-37] 20 (10/02 0544) BP: (106-184)/(65-86) 146/74 mmHg (10/02 0544) SpO2:  [93 %-100 %] 95 % (10/02 0715) Weight change:  Last BM Date: 04/27/14  Intake/Output from previous day: 10/01 0701 - 10/02 0700 In: 5912.1 [P.O.:360; I.V.:5402.1; IV Piggyback:150] Out: 445 [Urine:400; Drains:35; Blood:10]  PHYSICAL EXAM General appearance: alert, cooperative, mild distress and morbidly obese Resp: clear to auscultation bilaterally Cardio: regular rate and rhythm, S1, S2 normal, no murmur, click, rub or gallop GI: Postop she does have a drain in the gallbladder fossa area Extremities: extremities normal, atraumatic, no cyanosis or edema  Lab Results:  Results for orders placed during the hospital encounter of 04/26/14 (from the past 48 hour(s))  HEMOGLOBIN A1C     Status: Abnormal   Collection Time    04/26/14 12:07 PM      Result Value Ref Range   Hemoglobin A1C 11.7 (*) <5.7 %   Comment: (NOTE)                                                                               According to the ADA Clinical Practice Recommendations for 2011, when     HbA1c is used as a screening test:      >=6.5%   Diagnostic of Diabetes Mellitus               (if abnormal result is confirmed)     5.7-6.4%   Increased risk of developing Diabetes Mellitus     References:Diagnosis and Classification of Diabetes Mellitus,Diabetes     GGYI,9485,46(EVOJJ 1):S62-S69 and Standards of Medical Care in             Diabetes - 2011,Diabetes Care,2011,34 (Suppl 1):S11-S61.   Mean Plasma Glucose 289 (*) <117 mg/dL   Comment: Performed at [facility]   CBC WITH DIFFERENTIAL     Status: None   Collection Time    04/26/14 12:12 PM      Result Value Ref Range   WBC 7.2  4.0 - 10.5 K/uL   RBC 4.70  3.87 - 5.11 MIL/uL   Hemoglobin 13.4  12.0 - 15.0 g/dL   HCT 39.2  36.0 - 46.0 %   MCV 83.4  78.0 - 100.0 fL   MCH 28.5  26.0 - 34.0 pg   MCHC 34.2  30.0 - 36.0 g/dL   RDW 13.8  11.5 - 15.5 %   Platelets 233  150 - 400 K/uL   Neutrophils Relative % 51  43 - 77 %   Neutro Abs 3.7  1.7 - 7.7 K/uL   Lymphocytes Relative 41  12 - 46 %   Lymphs Abs 2.9  0.7 - 4.0 K/uL   Monocytes Relative 4  3 - 12 %   Monocytes Absolute 0.3  0.1 - 1.0 K/uL   Eosinophils Relative 3  0 - 5 %   Eosinophils Absolute 0.2  0.0 - 0.7 K/uL   Basophils Relative 1  0 - 1 %   Basophils Absolute 0.1  0.0 - 0.1 K/uL  COMPREHENSIVE METABOLIC PANEL     Status: Abnormal   Collection Time    04/26/14 12:12 PM      Result Value Ref Range   Sodium 136 (*) 137 - 147 mEq/L   Potassium 4.4  3.7 - 5.3 mEq/L   Chloride 96  96 - 112 mEq/L   CO2 23  19 - 32 mEq/L   Glucose, Bld 284 (*) 70 - 99 mg/dL   BUN 32 (*) 6 - 23 mg/dL   Creatinine, Ser 3.43 (*) 0.50 - 1.10 mg/dL   Calcium 10.3  8.4 - 10.5 mg/dL   Total Protein 7.3  6.0 - 8.3 g/dL   Albumin 3.3 (*) 3.5 - 5.2 g/dL   AST 18  0 - 37 U/L   ALT 27  0 - 35 U/L   Alkaline Phosphatase 96  39 - 117 U/L   Total Bilirubin 0.4  0.3 - 1.2 mg/dL   GFR calc non Af Amer 15 (*) >90 mL/min   GFR calc Af Amer 17 (*) >90 mL/min   Comment: (NOTE)     The eGFR has been calculated using the CKD EPI equation.     This calculation has not been validated in all clinical situations.     eGFR's persistently <90 mL/min signify possible Chronic Kidney     Disease.   Anion gap 17 (*) 5 - 15  GLUCOSE, CAPILLARY     Status: Abnormal   Collection Time    04/26/14  4:58 PM      Result Value Ref Range   Glucose-Capillary 235 (*) 70 - 99 mg/dL   Comment 1 Documented in Chart     Comment 2 Notify RN    MRSA PCR SCREENING     Status: None    Collection Time    04/26/14  6:06 PM      Result Value Ref Range   MRSA by PCR NEGATIVE  NEGATIVE   Comment:            The GeneXpert MRSA Assay (FDA     approved for NASAL specimens     only), is one component of a     comprehensive MRSA colonization     surveillance program. It is not     intended to diagnose MRSA     infection nor to guide or     monitor treatment for     MRSA infections.  GLUCOSE, CAPILLARY     Status: Abnormal   Collection Time    04/26/14 10:38 PM      Result Value Ref Range   Glucose-Capillary 340 (*) 70 - 99 mg/dL   Comment 1 Documented in Chart     Comment 2 Notify RN    CBC     Status: Abnormal   Collection Time    04/27/14  6:21 AM      Result Value Ref Range   WBC 4.9  4.0 - 10.5 K/uL   RBC 4.03  3.87 - 5.11 MIL/uL   Hemoglobin 11.3 (*) 12.0 - 15.0 g/dL   HCT 34.2 (*) 36.0 - 46.0 %   MCV 84.9  78.0 - 100.0 fL   MCH 28.0  26.0 - 34.0 pg   MCHC 33.0  30.0 - 36.0 g/dL   RDW 13.9  11.5 - 15.5 %   Platelets 160  150 - 400 K/uL   Comment: DELTA CHECK NOTED  BASIC METABOLIC PANEL     Status: Abnormal   Collection Time    04/27/14  6:21 AM      Result Value Ref Range   Sodium 143  137 - 147 mEq/L   Comment: DELTA CHECK NOTED   Potassium 4.3  3.7 - 5.3 mEq/L   Chloride 104  96 - 112 mEq/L   CO2 25  19 - 32 mEq/L   Glucose, Bld 229 (*) 70 - 99 mg/dL   BUN 29 (*) 6 - 23 mg/dL   Creatinine, Ser 3.08 (*) 0.50 - 1.10 mg/dL   Calcium 9.2  8.4 - 10.5 mg/dL   GFR calc non Af Amer 17 (*) >90 mL/min   GFR calc Af Amer 20 (*) >90 mL/min   Comment: (NOTE)     The eGFR has been calculated using the CKD EPI equation.     This calculation has not been validated in all clinical situations.     eGFR's persistently <90 mL/min signify possible Chronic Kidney     Disease.   Anion gap 14  5 - 15  GLUCOSE, CAPILLARY     Status: Abnormal   Collection Time    04/27/14  7:57 AM      Result Value Ref Range   Glucose-Capillary 276 (*) 70 - 99 mg/dL   Comment 1  Documented in Chart     Comment 2 Notify RN    GLUCOSE, CAPILLARY     Status: Abnormal   Collection Time    04/27/14  8:21 AM      Result Value Ref Range   Glucose-Capillary 254 (*) 70 - 99 mg/dL  GLUCOSE, CAPILLARY     Status: Abnormal   Collection Time    04/27/14 12:40 PM      Result Value Ref Range   Glucose-Capillary 220 (*) 70 - 99 mg/dL  GLUCOSE, CAPILLARY     Status: Abnormal   Collection Time    04/27/14  4:56 PM      Result Value Ref Range   Glucose-Capillary 242 (*) 70 - 99 mg/dL   Comment 1 Documented in Chart     Comment 2 Notify RN    GLUCOSE, CAPILLARY     Status: Abnormal   Collection Time    04/27/14  9:27 PM      Result Value Ref Range   Glucose-Capillary 192 (*) 70 - 99 mg/dL   Comment 1 Documented in Chart     Comment 2 Notify RN    HEPATIC FUNCTION PANEL     Status: Abnormal   Collection Time    04/28/14  6:31 AM      Result Value Ref Range   Total Protein 6.1  6.0 - 8.3 g/dL   Albumin 2.6 (*) 3.5 - 5.2 g/dL   AST 40 (*) 0 - 37 U/L   ALT 43 (*) 0 - 35 U/L   Alkaline Phosphatase 83  39 - 117 U/L   Total Bilirubin 0.2 (*) 0.3 - 1.2 mg/dL   Bilirubin, Direct <0.2  0.0 - 0.3 mg/dL   Indirect Bilirubin NOT CALCULATED  0.3 - 0.9 mg/dL  CBC WITH DIFFERENTIAL     Status: Abnormal   Collection Time    04/28/14  6:31 AM      Result Value Ref Range   WBC 5.7  4.0 - 10.5 K/uL   RBC 3.93  3.87 - 5.11 MIL/uL   Hemoglobin 11.1 (*) 12.0 - 15.0 g/dL   HCT 33.7 (*) 36.0 - 46.0 %   MCV 85.8  78.0 - 100.0 fL   MCH 28.2  26.0 - 34.0 pg   MCHC 32.9  30.0 - 36.0 g/dL   RDW 13.8  11.5 - 15.5 %   Platelets 165  150 - 400 K/uL   Neutrophils Relative % 60  43 - 77 %   Neutro Abs 3.4  1.7 - 7.7 K/uL   Lymphocytes Relative 32  12 - 46 %   Lymphs Abs 1.8  0.7 - 4.0 K/uL   Monocytes Relative 6  3 - 12 %   Monocytes Absolute 0.4  0.1 - 1.0 K/uL   Eosinophils Relative 1  0 - 5 %   Eosinophils Absolute 0.1  0.0 - 0.7 K/uL   Basophils Relative 1  0 - 1 %   Basophils  Absolute 0.0  0.0 - 0.1 K/uL  BASIC METABOLIC PANEL     Status: Abnormal   Collection Time    04/28/14  6:31 AM      Result Value Ref Range   Sodium 145  137 - 147 mEq/L   Potassium 4.4  3.7 - 5.3 mEq/L   Chloride 108  96 - 112 mEq/L   CO2 24  19 - 32 mEq/L   Glucose, Bld 263 (*) 70 - 99 mg/dL   BUN 19  6 - 23 mg/dL   Creatinine, Ser 2.20 (*) 0.50 - 1.10 mg/dL   Calcium 8.8  8.4 - 10.5 mg/dL   GFR calc non Af Amer 26 (*) >90 mL/min   GFR calc Af Amer 30 (*) >90 mL/min   Comment: (NOTE)     The eGFR has been calculated using the CKD EPI equation.     This calculation has not been validated in all clinical situations.     eGFR's persistently <90 mL/min signify possible Chronic Kidney     Disease.   Anion gap 13  5 - 15  GLUCOSE, CAPILLARY     Status: Abnormal   Collection Time    04/28/14  7:25 AM      Result Value Ref Range   Glucose-Capillary 234 (*) 70 - 99 mg/dL   Comment 1 Notify RN      ABGS No results found for this basename: PHART, PCO2, PO2ART, TCO2, HCO3,  in the last 72 hours CULTURES Recent Results (from the past 240 hour(s))  MRSA PCR SCREENING     Status: None   Collection Time    04/26/14  6:06 PM      Result Value Ref Range Status   MRSA by PCR NEGATIVE  NEGATIVE Final   Comment:            The GeneXpert MRSA Assay (FDA     approved for NASAL specimens     only), is one component of a     comprehensive MRSA colonization     surveillance program. It is not     intended to diagnose MRSA     infection nor to guide or     monitor treatment for     MRSA infections.   Studies/Results: US Abdomen Limited  04/26/2014   CLINICAL DATA:  Right upper quadrant pain  EXAM: US ABDOMEN LIMITED - RIGHT UPPER QUADRANT  COMPARISON:  CT scan from 04/20/2014.  FINDINGS: Gallbladder:  28 mm calcified stone noted in the gallbladder lumen. No gallbladder wall thickening  or pericholecystic fluid. The sonographer reports no sonographic Murphy sign.  Common bile duct:  Diameter:  Nondilated at 5 mm diameter.  Liver:  Echo chain 8, heterogeneous parenchyma consistent with steatosis.  IMPRESSION: Cholelithiasis without sonographic features of acute cholecystitis.   Electronically Signed   By: Misty Stanley M.D.   On: 04/26/2014 12:55    Medications:  Prior to Admission:  Prescriptions prior to admission  Medication Sig Dispense Refill  . albuterol (PROVENTIL) (2.5 MG/3ML) 0.083% nebulizer solution Take 2.5 mg by nebulization 4 (four) times daily.       Marland Kitchen ALPRAZolam (XANAX) 1 MG tablet Take 1 mg by mouth 4 (four) times daily.        Marland Kitchen amLODipine (NORVASC) 10 MG tablet Take 10 mg by mouth daily.      Marland Kitchen dicyclomine (BENTYL) 10 MG capsule Take 1 capsule (10 mg total) by mouth 4 (four) times daily -  before meals and at bedtime.  120 capsule  3  . HYDROcodone-acetaminophen (NORCO) 10-325 MG per tablet Take 1 tablet by mouth every 6 (six) hours as needed for pain.      . Insulin Aspart Prot & Aspart (NOVOLOG MIX 70/30 FLEXPEN) (70-30) 100 UNIT/ML Pen Inject 80 Units into the skin 2 (two) times daily.       Marland Kitchen lisinopril-hydrochlorothiazide (PRINZIDE,ZESTORETIC) 20-12.5 MG per tablet Take 1 tablet by mouth 2 (two) times daily.       . metFORMIN (GLUCOPHAGE) 1000 MG tablet Take 1,000 mg by mouth 2 (two) times daily with a meal.      . omeprazole (PRILOSEC) 20 MG capsule Take 20 mg by mouth 2 (two) times daily as needed (acid reflux).       Marland Kitchen rOPINIRole (REQUIP) 1 MG tablet Take 1 mg by mouth at bedtime.       . simvastatin (ZOCOR) 40 MG tablet Take 40 mg by mouth every evening.      Marland Kitchen UNKNOWN TO PATIENT Apply 1 application topically daily as needed (for irritation (vaginal area) COMPOUND MEDICATION: Ketoconazole mixed with another medication  (name is unknown)).       Scheduled: . albuterol  2.5 mg Nebulization QID  . ALPRAZolam  1 mg Oral QID  . amLODipine  10 mg Oral Daily  . cefTRIAXone (ROCEPHIN)  IV  1 g Intravenous Q12H  . insulin aspart  0-20 Units Subcutaneous TID WC   . insulin aspart  0-5 Units Subcutaneous QHS  . pantoprazole  40 mg Oral Daily  . rOPINIRole  1 mg Oral QHS  . simvastatin  40 mg Oral QPM   Continuous: . sodium chloride 125 mL/hr at 04/28/14 0220   SPQ:[id number], acetaminophen, alum & mag hydroxide-simeth, bisacodyl, HYDROcodone-acetaminophen, morphine injection, ondansetron (ZOFRAN) IV, ondansetron, traZODone  Assesment: She has acute renal failure which I think is from dehydration. She had cholelithiasis and has had a cholecystectomy yesterday. Her renal function has improved. She is overall better. Her blood sugar is better Principal Problem:   Acute renal failure Active Problems:   HYPERLIPIDEMIA   OBESITY   DEPRESSION   RESTLESS LEG SYNDROME   HYPERTENSION   GERD   SLEEP APNEA   Umbilical hernia without mention of obstruction or gangrene   Uncontrolled diabetes mellitus   Abdominal pain   Cholelithiasis   Dehydration   Hyponatremia    Plan: Continue current treatments    LOS: 2 days   Martha [name] L 04/28/2014, 8:59 AM

## 2014-04-28 NOTE — Progress Notes (Signed)
Martha [name] discharged home per MD order.  Discharge instructions reviewed and discussed with the patient, all questions and concerns answered. Copy of instructions and scripts given to patient.    Medication List         albuterol (2.5 MG/3ML) 0.083% nebulizer solution  Commonly known as:  PROVENTIL  Take 2.5 mg by nebulization 4 (four) times daily.     ALPRAZolam 1 MG tablet  Commonly known as:  XANAX  Take 1 mg by mouth 4 (four) times daily.     amLODipine 10 MG tablet  Commonly known as:  NORVASC  Take 10 mg by mouth daily.     dicyclomine 10 MG capsule  Commonly known as:  BENTYL  Take 1 capsule (10 mg total) by mouth 4 (four) times daily -  before meals and at bedtime.     HYDROcodone-acetaminophen 10-325 MG per tablet  Commonly known as:  NORCO  Take 1 tablet by mouth every 6 (six) hours as needed for pain.     lisinopril-hydrochlorothiazide 20-12.5 MG per tablet  Commonly known as:  PRINZIDE,ZESTORETIC  Take 1 tablet by mouth 2 (two) times daily.     metFORMIN 1000 MG tablet  Commonly known as:  GLUCOPHAGE  Take 1,000 mg by mouth 2 (two) times daily with a meal.     NOVOLOG MIX 70/30 FLEXPEN (70-30) 100 UNIT/ML Pen  Generic drug:  Insulin Aspart Prot & Aspart  Inject 80 Units into the skin 2 (two) times daily.     omeprazole 20 MG capsule  Commonly known as:  PRILOSEC  Take 20 mg by mouth 2 (two) times daily as needed (acid reflux).     rOPINIRole 1 MG tablet  Commonly known as:  REQUIP  Take 1 mg by mouth at bedtime.     simvastatin 40 MG tablet  Commonly known as:  ZOCOR  Take 40 mg by mouth every evening.     UNKNOWN TO PATIENT  Apply 1 application topically daily as needed (for irritation (vaginal area) COMPOUND MEDICATION: Ketoconazole mixed with another medication  (name is unknown)).        Patients dressing are clean, dry, and intact. IV site discontinued and catheter remains intact. Site without signs and symptoms of complications. Dressing  and pressure applied.  Patient escorted to car by Martha [name], NT in a wheelchair,  no distress noted upon discharge.  Martha [name] 04/28/2014 6:06 PM

## 2014-04-28 NOTE — Progress Notes (Signed)
Inpatient Diabetes Program Recommendations  AACE/ADA: New Consensus Statement on Inpatient Glycemic Control (2013)  Target Ranges:  Prepandial:   less than 140 mg/dL      Peak postprandial:   less than 180 mg/dL (1-2 hours)      Critically ill patients:  140 - 180 mg/dL   Results for Martha [name], Martha [name] (MRN [id number]) as of 04/28/2014 15:16  Ref. Range 04/27/2014 07:57 04/27/2014 08:21 04/27/2014 12:40 04/27/2014 16:56 04/27/2014 21:27 04/28/2014 07:25 04/28/2014 11:55  Glucose-Capillary Latest Range: 70-99 mg/dL 276 (H) 254 (H) 220 (H) 242 (H) 192 (H) 234 (H) 227 (H)   Diabetes history: DM2 Outpatient Diabetes medications: Novolog 70/30 80 units BID, Metformin 1000 mg BID, Byetta 10 mcg BID Current orders for Inpatient glycemic control: Novolog 0-20 units AC, Novolog 0-5 units HS  Inpatient Diabetes Program Recommendations Insulin - Basal: Please consider resuming basal insulin at a lower dose than used as an outpatient.   Note: Spoke with patient about diabetes and home regimen for diabetes control. Patient reports that she is followed by her PCP for diabetes management and currently she takes Novolog 70/30 80 units BID, Byetta 10 mcg BID, and Metformin 1000 mg BID as an outpatient for diabetes control.  According to the patient she works with both Dr. Luan Pulling and Zenaida Deed, [place] at [facility] for diabetes management and they have been working together to help her get her diabetes better controlled. Inquired about knowledge about A1C and patient is able to verbalize what an A1C is. Discussed A1C results (11.7% on 04/26/14) and explained importance of checking CBGs and maintaining good CBG control to prevent long-term and short-term complications. Patient expressed that she knows her "current A1C is high but it is better than it was".  Patient reports that she has been having issues with her gallbladder for over two months and she has been on a special diet during that time. She reports that she has lost  almost 70 pounds over the past 2 months since she has been following the diet strictly because she knew that if she ate foods not on the diet it caused her to have more pain. Inquired about how glucose has been running since she has lost weight and she states that she has been having at least 10 hypoglycemic events over a 2 week period and that Nate, RPh has been working with her to adjust her insulin. Discussed impact of weight, nutrition, exercise, stress, sickness, and medications on diabetes control.  Patient states that since she has lost weight she feels better and is able to do more (she was able to bend over and trim her toenails last week and she states that before she could not even tie her shoe because it cut her breath off when she bent over). Patient seems to be motivated and wants to make changes and to continue to lose weight and get diabetes controlled. She does express frustration with being overweight and limited to what she is able to do. Encouraged patient to continue to lose weight, eat healthy, and work with her PCP to improve diabetes control.  Patient verbalized understanding of information discussed and she states that she has no further questions at this time related to diabetes.   Thanks, Barnie Alderman, RN, MSN, CCRN Diabetes Coordinator Inpatient Diabetes Program [phone] (Team Pager) [phone] (AP office) [phone] [place] office)

## 2014-04-28 NOTE — Plan of Care (Signed)
Problem: Food- and Nutrition-Related Knowledge Deficit (NB-1.1) Goal: Nutrition education Formal process to instruct or train a patient/client in a skill or to impart knowledge to help patients/clients voluntarily manage or modify food choices and eating behavior to maintain or improve health. Outcome: Adequate for Discharge  RD consulted for nutrition education regarding diabetes.     Lab Results  Component Value Date    HGBA1C 11.7* 04/26/2014   Pt reports she was diagnosed with diabetes approximately 5 years ago and has always struggled with glycemic control. Hgb A1c has historically been in the 10-11 range. Home CBGs range in the 300-500's.   Pt reports that she was delighted to lose weight on the restrictive diet for her cholecystectomy, however, reports "I can't eat chicken, rice, and broccoli for the rest of my life". She expresses desire to improve her weight and blood sugar control to help her heal from surgery. Educated pt on importance of good glycemic control to prevent further complications and to promote healing process. Also shared Hgb A1c results with pt, and discussed goals for ideal ranges.   Pt reports she has tried to make changes in her diet recently, by eating more fruits and vegetables and drinking low calorie beverages. She typically eats 1-2 meals per day, as she ususally skips breakfast. Meals are prepared by her daughter and boyfriend and she does eat out often. Diet recall consists of: Breakfast: skip or egg, sausage, grits ("I put cheese on everything"), Lunch: slice of pizza and salad (lettuce, vegetables, cheese, bacon bits, pickles, olives, ranch dressing), Dinner: frozen tacos OR tilapia, slaw, hushpuppies OR fried pork chops, starch, vegetable. Beverages consists of water and diet soft drinks.   RD provided "Carbohydrate Counting for People with Diabetes" handout from the Academy of Nutrition and Dietetics. Discussed different food groups and their effects on blood  sugar, emphasizing carbohydrate-containing foods. Provided list of carbohydrates and recommended serving sizes of common foods.  Discussed importance of controlled and consistent carbohydrate intake throughout the day. Provided examples of ways to balance meals/snacks and encouraged intake of high-fiber, whole grain complex carbohydrates. Teach back method used.  Expect fair compliance.  Body mass index is 48.51 kg/(m^2). Pt meets criteria for extreme obesity, class III based on current BMI.  Current diet order is Soft, Carb Modified, patient is consuming approximately 70% of meals at this time. Labs and medications reviewed. No further nutrition interventions warranted at this time. RD contact information provided. If additional nutrition issues arise, please re-consult RD.  Kyler Germer A. Jimmye Norman, RD, LDN Pager: [phone]

## 2014-04-28 NOTE — Progress Notes (Signed)
Filed Vitals:   04/28/14 0544  BP: 146/74  Pulse: 100  Temp: 98.7 F (37.1 C)  Resp: 20   Abdomen remains soft and pt has no GI or GU complaints.  Wounds are clean.  Very minimal JP driange; will remove drain.  Discussed discharge and follow up with pt and Dr Luan Pulling.  We will see her in one week for clip removal. Blood sugar 227, which shows improvement.  Dr Luan Pulling will formally discharge later.

## 2014-04-28 NOTE — Op Note (Signed)
NAME:  Martha [name], Martha [name] NO.:  [id number]  MEDICAL RECORD NO.:  [id number]  LOCATION:                                 FACILITY:  PHYSICIAN:  Felicie Morn, M.D. DATE OF BIRTH:  May 16, 1966  DATE OF PROCEDURE:  04/27/2014 DATE OF DISCHARGE:                              OPERATIVE REPORT   PREOPERATIVE DIAGNOSES:  Cholecystitis and cholelithiasis.  POSTOPERATIVE DIAGNOSES:  Cholecystitis and cholelithiasis.  PROCEDURE:  Laparoscopic cholecystectomy.  SURGEON:  Felicie Morn, M.D.  SPECIMEN:  Gallbladder and stone.  WOUND CLASSIFICATION:  Contaminated.  NOTE:  This is a 48 year old, morbidly obese, white female who had cholelithiasis and presented to the emergency room with symptoms of cholecystitis as well as having her diabetes out of control.  She was admitted to the Medical Service.  She was stabilized and we took her to surgery on the following day.  We repeated the gallbladder sonogram and did not show any signs of acute cholecystitis.  However, intraoperatively, we did see some thickening and new edema around the distal portion of the gallbladder.  There was a large stone as well within the gallbladder, at least an inch in diameter.  Her liver functions preoperatively were grossly within normal limits.  TECHNIQUE:  The patient was placed in supine position.  After the adequate administration of general anesthesia via endotracheal intubation, her entire abdomen was prepped with Betadine solution and draped in the usual manner.  Foley catheter was aseptically inserted and we then made an incision around the periumbilical area, in the superior umbilicus area down to the fascia which I grasped with a sharp towel clip and inserted a Veress needle which was confirmed in position with a saline drop test.  We then insufflated the abdomen with approximately 3.5 L of CO2.  We then placed an elongated catheter 11-mm cannula in the umbilicus, then under  direct vision placed another 11-mm cannula in the epigastrium, and two 5-mm cannulas in the right upper quadrant laterally.  We did require a second larger cannula to place a fan retractor to help [place] with the adipose tissue to allow [place] better dissection.  The gallbladder was grasped in the fundus with a grasping clamp and then, we dissected down from the fundus towards Hartmann's pouch and clearly identified the cystic duct entering the gallbladder, this was triply silver clipped and divided as was the cystic artery.  We then were able to remove the gallbladder without complication from the liver bed.  It was slightly intrahepatic but we were able to remove it without any undue bleeding.  The dissection was tedious because of the amount of adipose tissue and difficulty but once we were able to see better with a fan retractor and we were able to remove the gallbladder without too much difficulty.  I had originally scheduled to a repair of an umbilical hernia, I was not sure that there was an umbilical hernia clinically.  A CT scan did not reveal one and I did not find one intraoperatively.  We then closed all large incisions, that would be the incision for the fan retractor, for the cannula in the umbilicus and the epigastrium with 0  Polysorb suture.  We had to extend the incision somewhat to make sure that this was closed entirely and then closed the wound with a stapling device.  Prior to closure, I did elect to leave a Jackson-Pratt drain in the liver bed as well as some Surgicel.  This exited from one of the lateral most incisions and was sutured in place with 3-0 nylon.  Prior to closure, all sponge, needle, and instrument counts were found to be correct.  She received 2500 mL of crystalloids intraoperatively.  The estimated blood loss was less than 25 or 50 mL. Operating time was just under 3 hours.     Felicie Morn, M.D.     WB/MEDQ  D:  04/27/2014  T:  04/28/2014  Job:   [id number]  cc:   Dr. Luan Pulling

## 2014-04-28 NOTE — Anesthesia Postprocedure Evaluation (Signed)
  Anesthesia Post-op Note  Patient: Martha [name]  Procedure(s) Performed: Procedure(s): LAPAROSCOPIC CHOLECYSTECTOMY (N/A)  Patient Location: Nursing Unit  Anesthesia Type:General  Level of Consciousness: awake, alert  and oriented  Airway and Oxygen Therapy: Patient Spontanous Breathing  Post-op Pain: none  Post-op Assessment: Post-op Vital signs reviewed, Patient's Cardiovascular Status Stable, Respiratory Function Stable, Patent Airway and No signs of Nausea or vomiting  Post-op Vital Signs: Reviewed and stable  Last Vitals:  Filed Vitals:   04/28/14 0544  BP: 146/74  Pulse: 100  Temp: 37.1 C  Resp: 20    Complications: No apparent anesthesia complications

## 2014-04-28 NOTE — Progress Notes (Signed)
This is documentation of my visit of 04/27/2014 in the preoperative area. She is awake and alert and wishes to proceed with surgery. Her blood sugar has been elevated but is at about her baseline. She generally has very poor control of her blood sugar. She says her pain is controlled at this point. She is still nauseated. Her physical examination shows she is awake and alert her chest is clear her heart is regular her abdomen is mild right upper quadrant tenderness. Her indication planned surgery and I discussed this with Dr. Romona Curls

## 2014-04-28 NOTE — Addendum Note (Signed)
Addendum created 04/28/14 1311 by Ollen Bowl, CRNA   Modules edited: Notes Section   Notes Section:  File: [id number]

## 2014-04-28 NOTE — Progress Notes (Signed)
POD # 1  Filed Vitals:   04/28/14 0544  BP: 146/74  Pulse: 100  Temp: 98.7 F (37.1 C)  Resp: 20    Doing well post op.  Wound clean and dry and redressed. Minimal JP serous drainage.  Voiding well.  Abdomen is soft, tolerating PO without nausea.  Will remove drain prior to discharge which will be when blood sugars are acceptable to med service.  Labs reviewed and there is no worrisome bump in LFS.  Will discharge when OK with Dr. Luan Pulling.

## 2014-04-29 NOTE — Discharge Summary (Signed)
Physician Discharge Summary  Patient ID: Martha [name] MRN: [id number] DOB/AGE: 46-Nov-1967 45 y.o. Primary Care Physician:Sondra Blixt L, MD Admit date: 04/26/2014 Discharge date: 04/28/2014    Discharge Diagnoses:   Principal Problem:   Acute renal failure Active Problems:   HYPERLIPIDEMIA   OBESITY   DEPRESSION   RESTLESS LEG SYNDROME   HYPERTENSION   GERD   SLEEP APNEA   Umbilical hernia without mention of obstruction or gangrene   Uncontrolled diabetes mellitus   Abdominal pain   Cholelithiasis   Dehydration   Hyponatremia  cholecystitis    Medication List         albuterol (2.5 MG/3ML) 0.083% nebulizer solution  Commonly known as:  PROVENTIL  Take 2.5 mg by nebulization 4 (four) times daily.     ALPRAZolam 1 MG tablet  Commonly known as:  XANAX  Take 1 mg by mouth 4 (four) times daily.     amLODipine 10 MG tablet  Commonly known as:  NORVASC  Take 10 mg by mouth daily.     dicyclomine 10 MG capsule  Commonly known as:  BENTYL  Take 1 capsule (10 mg total) by mouth 4 (four) times daily -  before meals and at bedtime.     HYDROcodone-acetaminophen 10-325 MG per tablet  Commonly known as:  NORCO  Take 1 tablet by mouth every 6 (six) hours as needed for pain.     lisinopril-hydrochlorothiazide 20-12.5 MG per tablet  Commonly known as:  PRINZIDE,ZESTORETIC  Take 1 tablet by mouth 2 (two) times daily.     metFORMIN 1000 MG tablet  Commonly known as:  GLUCOPHAGE  Take 1,000 mg by mouth 2 (two) times daily with a meal.     NOVOLOG MIX 70/30 FLEXPEN (70-30) 100 UNIT/ML Pen  Generic drug:  Insulin Aspart Prot & Aspart  Inject 80 Units into the skin 2 (two) times daily.     omeprazole 20 MG capsule  Commonly known as:  PRILOSEC  Take 20 mg by mouth 2 (two) times daily as needed (acid reflux).     rOPINIRole 1 MG tablet  Commonly known as:  REQUIP  Take 1 mg by mouth at bedtime.     simvastatin 40 MG tablet  Commonly known as:  ZOCOR  Take 40 mg by  mouth every evening.     UNKNOWN TO PATIENT  Apply 1 application topically daily as needed (for irritation (vaginal area) COMPOUND MEDICATION: Ketoconazole mixed with another medication  (name is unknown)).        Discharged Condition: Improved    Consults: Surgery Dr. Romona Curls  Significant Diagnostic Studies: Ct Abdomen W Contrast  04/20/2014   CLINICAL DATA:  Umbilical hernia. Nausea and vomiting for 6 months after eating.  EXAM: CT ABDOMEN WITH CONTRAST  TECHNIQUE: Multidetector CT imaging of the abdomen was performed using the standard protocol following bolus administration of intravenous contrast.  CONTRAST:  47mL OMNIPAQUE IOHEXOL 300 MG/ML  SOLN  COMPARISON:  CT of the abdomen and pelvis 02/03/2014.  FINDINGS: Lower chest:  Unremarkable.  Hepatobiliary: Large rim calcified gallstone in the gallbladder measuring 2.1 cm in diameter. The gallbladder is nearly completely contracted around the stone. No surrounding inflammatory changes to suggest an acute cholecystitis at this time. No focal hepatic lesions are noted. No intra or extrahepatic biliary ductal dilatation.  Spleen: Unremarkable.  Pancreas: Normal in appearance.  Stomach/Bowel: The stomach is normal in appearance. There is no pathologic dilatation of the visualized small bowel or colon. No inflammatory changes in  the visualize small bowel or colon.  Adrenals/urinary tract: 1.4 cm low-attenuation lesion in the lateral aspect of the interpolar region of the left kidney is compatible with a simple cyst. The appearance of the right kidney is normal. Normal appearance of the adrenal glands bilaterally.  Vascular/Lymphatic: Minimal calcified atherosclerotic plaque in the visualized abdominal vasculature, without evidence of aneurysm. No lymphadenopathy noted in the visualized portions of the abdomen.  Musculoskeletal: There are no aggressive appearing lytic or blastic lesions noted in the visualized portions of the skeleton.  Other: The  pelvis was not imaged on today's CT of the abdomen. The anterior abdominal wall was visualized to a level immediately below the level of the umbilicus. No anterior wall hernia identified. Specifically, no significant umbilical hernia noted. Prior recent examination from 02/03/2014 also demonstrates no anterior abdominal wall hernia lower down in the pelvis. Within the visualized peritoneal cavity there is no significant volume of ascites and no pneumoperitoneum.  IMPRESSION: 1. No evidence of umbilical hernia. 2. No acute findings. 3. Cholelithiasis without evidence to suggest acute cholecystitis at this time. 4. 1.4 cm simple cyst in the interpolar region of the left kidney again noted.   Electronically Signed   By: Vinnie Langton M.D.   On: 04/20/2014 13:14   US Abdomen Complete  03/30/2014   CLINICAL DATA:  Abdominal pain  EXAM: ULTRASOUND ABDOMEN COMPLETE  COMPARISON:  CT abdomen pelvis 02/03/2014  FINDINGS: Gallbladder:  Cholelithiasis. Largest gallstone 2.7 cm. Gallbladder wall 2.9 mm. Negative sonographic Murphy sign.  Common bile duct:  Diameter: 7.3 mm  Liver:  Diffusely echogenic liver suggesting fatty infiltration. No focal liver lesion.  IVC:  Not visualized  Pancreas:  Visualized portion unremarkable.  Spleen:  Splenomegaly. Splenic volume 589 mL. Spleen measures 12.6 cm in length  Right Kidney:  Length: 12.6 cm. Echogenicity within normal limits. No mass or hydronephrosis visualized.  Left Kidney:  Length: 13.6 cm. Echogenicity within normal limits. No mass or hydronephrosis visualized.  Abdominal aorta:  No aneurysm visualized.  Other findings:  None.  IMPRESSION: Cholelithiasis.  Negative sonographic Murphy sign.  Splenomegaly   Electronically Signed   By: Franchot Gallo M.D.   On: 03/30/2014 12:00   US Abdomen Limited  04/26/2014   CLINICAL DATA:  Right upper quadrant pain  EXAM: US ABDOMEN LIMITED - RIGHT UPPER QUADRANT  COMPARISON:  CT scan from 04/20/2014.  FINDINGS: Gallbladder:  28 mm  calcified stone noted in the gallbladder lumen. No gallbladder wall thickening or pericholecystic fluid. The sonographer reports no sonographic Murphy sign.  Common bile duct:  Diameter: Nondilated at 5 mm diameter.  Liver:  Echo chain 8, heterogeneous parenchyma consistent with steatosis.  IMPRESSION: Cholelithiasis without sonographic features of acute cholecystitis.   Electronically Signed   By: Misty Stanley M.D.   On: 04/26/2014 12:55    Lab Results: Basic Metabolic Panel:  Recent Labs  04/27/14 0621 04/28/14 0631  NA 143 145  K 4.3 4.4  CL 104 108  CO2 25 24  GLUCOSE 229* 263*  BUN 29* 19  CREATININE 3.08* 2.20*  CALCIUM 9.2 8.8   Liver Function Tests:  Recent Labs  04/26/14 1212 04/28/14 0631  AST 18 40*  ALT 27 43*  ALKPHOS 96 83  BILITOT 0.4 0.2*  PROT 7.3 6.1  ALBUMIN 3.3* 2.6*     CBC:  Recent Labs  04/26/14 1212 04/27/14 0621 04/28/14 0631  WBC 7.2 4.9 5.7  NEUTROABS 3.7  --  3.4  HGB 13.4 11.3* 11.1*  HCT 39.2 34.2* 33.7*  MCV 83.4 84.9 85.8  PLT 233 160 165    Recent Results (from the past 240 hour(s))  MRSA PCR SCREENING     Status: None   Collection Time    04/26/14  6:06 PM      Result Value Ref Range Status   MRSA by PCR NEGATIVE  NEGATIVE Final   Comment:            The GeneXpert MRSA Assay (FDA     approved for NASAL specimens     only), is one component of a     comprehensive MRSA colonization     surveillance program. It is not     intended to diagnose MRSA     infection nor to guide or     monitor treatment for     MRSA infections.     Hospital Course: This is a 48 year old who has been having abdominal pain for several weeks. She's had nausea vomiting and has had to reduce the amount of food she eats and has lost about 20 pounds. At baseline she is morbidly obese and has severe diabetes which has generally been uncontrolled despite multiple interventions. She had been to see Dr. Romona Curls regarding potential cholecystectomy and  was being set up for that but continued to have abdominal pain nausea poor oral intake and came to the emergency department where she was noted to have acute renal failure thought to be mostly related to her dehydration from poor oral intake. Her blood sugar was elevated and she was placed on sliding scale insulin as well as basal insulin. Her sugar improved. Consultation in the hospital was obtained with Dr. Romona Curls and she went to surgery on 04/27/2014 with a cholecystectomy. it was originally thought that she might also need a treatment on umbilical hernia but intraoperatively it was not clear that she actually had an umbilical hernia so this was not done. She remained in the hospital overnight and was discharged home the next day in much improved condition.  Discharge Exam: Blood pressure 146/74, pulse 100, temperature 98.7 F (37.1 C), temperature source Oral, resp. rate 20, height 5\' 8"  (1.727 m), weight 144.697 kg (319 lb), SpO2 96.00%. She is awake and alert. Surgical scars appear to be healing. Her chest is clear. Abdomen is soft  Disposition: Home      Discharge Instructions   Discharge patient    Complete by:  As directed              Signed: Soraida Vickers L   04/29/2014, 8:40 AM

## 2014-05-29 ENCOUNTER — Encounter (HOSPITAL_COMMUNITY): Payer: Self-pay | Admitting: General Surgery

## 2014-06-12 ENCOUNTER — Other Ambulatory Visit (HOSPITAL_COMMUNITY): Payer: Self-pay | Admitting: Pulmonary Disease

## 2014-06-12 DIAGNOSIS — M79606 Pain in leg, unspecified: Secondary | ICD-10-CM

## 2014-06-21 ENCOUNTER — Ambulatory Visit (HOSPITAL_COMMUNITY)
Admission: RE | Admit: 2014-06-21 | Discharge: 2014-06-21 | Disposition: A | Discharge status: Home / Self Care | Payer: Medicaid Other | Source: Ambulatory Visit | Attending: Pulmonary Disease | Admitting: Pulmonary Disease

## 2014-06-21 DIAGNOSIS — M79606 Pain in leg, unspecified: Secondary | ICD-10-CM | POA: Diagnosis present

## 2014-06-21 DIAGNOSIS — M79605 Pain in left leg: Secondary | ICD-10-CM | POA: Insufficient documentation

## 2014-06-21 DIAGNOSIS — M79604 Pain in right leg: Secondary | ICD-10-CM | POA: Diagnosis not present

## 2014-07-23 ENCOUNTER — Emergency Department (HOSPITAL_COMMUNITY)
Admission: EM | Admit: 2014-07-23 | Discharge: 2014-07-23 | Disposition: A | Discharge status: Home / Self Care | Payer: Medicaid Other | Attending: Emergency Medicine | Admitting: Emergency Medicine

## 2014-07-23 ENCOUNTER — Encounter (HOSPITAL_COMMUNITY): Payer: Self-pay

## 2014-07-23 DIAGNOSIS — Z862 Personal history of diseases of the blood and blood-forming organs and certain disorders involving the immune mechanism: Secondary | ICD-10-CM | POA: Insufficient documentation

## 2014-07-23 DIAGNOSIS — Z792 Long term (current) use of antibiotics: Secondary | ICD-10-CM | POA: Insufficient documentation

## 2014-07-23 DIAGNOSIS — I10 Essential (primary) hypertension: Secondary | ICD-10-CM | POA: Diagnosis not present

## 2014-07-23 DIAGNOSIS — M549 Dorsalgia, unspecified: Secondary | ICD-10-CM | POA: Insufficient documentation

## 2014-07-23 DIAGNOSIS — G4733 Obstructive sleep apnea (adult) (pediatric): Secondary | ICD-10-CM | POA: Insufficient documentation

## 2014-07-23 DIAGNOSIS — K219 Gastro-esophageal reflux disease without esophagitis: Secondary | ICD-10-CM | POA: Diagnosis not present

## 2014-07-23 DIAGNOSIS — G2581 Restless legs syndrome: Secondary | ICD-10-CM | POA: Diagnosis not present

## 2014-07-23 DIAGNOSIS — E669 Obesity, unspecified: Secondary | ICD-10-CM | POA: Diagnosis not present

## 2014-07-23 DIAGNOSIS — Z9981 Dependence on supplemental oxygen: Secondary | ICD-10-CM | POA: Diagnosis not present

## 2014-07-23 DIAGNOSIS — M25561 Pain in right knee: Secondary | ICD-10-CM | POA: Insufficient documentation

## 2014-07-23 DIAGNOSIS — J441 Chronic obstructive pulmonary disease with (acute) exacerbation: Secondary | ICD-10-CM | POA: Insufficient documentation

## 2014-07-23 DIAGNOSIS — M1711 Unilateral primary osteoarthritis, right knee: Secondary | ICD-10-CM | POA: Insufficient documentation

## 2014-07-23 DIAGNOSIS — E119 Type 2 diabetes mellitus without complications: Secondary | ICD-10-CM | POA: Diagnosis not present

## 2014-07-23 DIAGNOSIS — Z794 Long term (current) use of insulin: Secondary | ICD-10-CM | POA: Insufficient documentation

## 2014-07-23 DIAGNOSIS — Z87891 Personal history of nicotine dependence: Secondary | ICD-10-CM | POA: Insufficient documentation

## 2014-07-23 DIAGNOSIS — Z79899 Other long term (current) drug therapy: Secondary | ICD-10-CM | POA: Diagnosis not present

## 2014-07-23 DIAGNOSIS — M1990 Unspecified osteoarthritis, unspecified site: Secondary | ICD-10-CM | POA: Diagnosis not present

## 2014-07-23 DIAGNOSIS — F319 Bipolar disorder, unspecified: Secondary | ICD-10-CM | POA: Insufficient documentation

## 2014-07-23 MED ORDER — ACETAMINOPHEN 325 MG PO TABS
650.0000 mg | ORAL_TABLET | Freq: Once | ORAL | Status: AC
  Administered 2014-07-23: 650 mg via ORAL
  Filled 2014-07-23: qty 2

## 2014-07-23 MED ORDER — CELECOXIB 100 MG PO CAPS: 100.0000 mg | ORAL_CAPSULE | Freq: Two times a day (BID) | ORAL | Status: DC

## 2014-07-23 MED ORDER — KETOROLAC TROMETHAMINE 10 MG PO TABS
10.0000 mg | ORAL_TABLET | Freq: Once | ORAL | Status: AC
  Administered 2014-07-23: 10 mg via ORAL
  Filled 2014-07-23: qty 1

## 2014-07-23 NOTE — ED Provider Notes (Signed)
CSN: [id number]     Arrival date & time 07/23/14  1946 History   First MD Initiated Contact with Patient 07/23/14 2101     Chief Complaint  Patient presents with  . Leg Pain     (Consider location/radiation/quality/duration/timing/severity/associated sxs/prior Treatment) HPI Comments: Patient is a 48 year old female who presents to the emergency department with a complaint of right leg pain. The patient states this problem started approximately 2 months ago. It was at first thought she may have some problems with a blood clot. The patient was examined by ultrasound about November 26, and the test was negative for DVT. The patient states she is taking pain medicine, but continues to have pain in the knee. She states this morning she could hardly get around because of the pain. She presents to the emergency department at this time for additional evaluation of this problem. Pain is worse when she attempts to walk or stand.  Patient is a 48 y.o. female presenting with leg pain. The history is provided by the patient.  Leg Pain Location:  Knee Associated symptoms: back pain   Associated symptoms: no neck pain     Past Medical History  Diagnosis Date  . Diabetes mellitus   . Hypertension   . RLS (restless legs syndrome)   . Acid reflux   . Bipolar 1 disorder   . Depression   . OSA (obstructive sleep apnea)     cpap  . Anemia   . Arthritis   . Degenerative disc disease, thoracic   . Shortness of breath   . Obesity (BMI 30-39.9)   . COPD (chronic obstructive pulmonary disease) 2014    bronchitis   Past Surgical History  Procedure Laterality Date  . Tubal ligation    . Endometrial ablation    . Colonoscopy  06/06/2010    [id number] bleeding secondary to internal hemorrhoids but incomplete evaluation secondary to poor right colon prep/small rectal and sigmoid colon polyps (hyperplastic). PROPOFOL  . Esophagogastroduodenoscopy  06/06/2010    [id number] erythema and edema of body of  stomach, with sessile polypoid lesions. bx benign. no h.pylori  . Colonoscopy  May 2013    Dr. Gilliam/[facility]: 5 mm a descending colon polyp, hyperplastic. Adequate bowel prep.  . Flexible sigmoidoscopy N/A 05/17/2013    Dr. Oneida Alar: moderate sized internal hemorrhoids  . Esophagogastroduodenoscopy (egd) with propofol N/A 05/17/2013    Dr. Oneida Alar: normal esophagus, moderate nodular gastritis, negative path, empiric Savary dilation  . Savory dilation N/A 05/17/2013    Procedure: SAVORY DILATION;  Surgeon: [name], MD;  Location: [facility];  Service: Endoscopy;  Laterality: N/A;  14/15/16  . Hemorrhoid banding N/A 05/17/2013    Procedure: HEMORRHOID BANDING;  Surgeon: [name], MD;  Location: [facility];  Service: Endoscopy;  Laterality: N/A;  2 bands placed  . Esophageal biopsy N/A 05/17/2013    Procedure: BIOPSY;  Surgeon: [name], MD;  Location: [facility];  Service: Endoscopy;  Laterality: N/A;  . Irrigation and debridement abscess Right 06/01/2013    Procedure: INCISION AND DRAINAGE AND DEBRIDEMENT ABSCESS RIGHT BREAST;  Surgeon: [name], MD;  Location: [facility];  Service: General;  Laterality: Right;  . Incision and drainage abscess Left 10/11/2013    Procedure: INCISION AND DRAINAGE AND DEBRIDEMENT LEFT BREAST  ABSCESS;  Surgeon: [name], MD;  Location: [facility];  Service: General;  Laterality: Left;  . Cholecystectomy N/A 04/27/2014    Procedure: LAPAROSCOPIC CHOLECYSTECTOMY;  Surgeon: [name], MD;  Location: [facility];  Service: General;  Laterality: N/A;   Family History  Problem Relation Age of Onset  . Adopted: Yes   History  Substance Use Topics  . Smoking status: Former Smoker -- 1.50 packs/day for 25 years    Types: Cigarettes    Quit date: 10/23/2007  . Smokeless tobacco: Former Systems developer     Comment: Quit x 6 years ago  . Alcohol Use: No   OB History    Gravida Para Term Preterm AB TAB SAB Ectopic Multiple Living   2 2 1 1      2      Review  of Systems  Constitutional: Negative for activity change.       All ROS Neg except as noted in HPI  HENT: Negative for nosebleeds.   Eyes: Negative for photophobia and discharge.  Respiratory: Negative for cough, shortness of breath and wheezing.   Cardiovascular: Negative for chest pain and palpitations.  Gastrointestinal: Negative for abdominal pain and blood in stool.  Genitourinary: Negative for dysuria, frequency and hematuria.  Musculoskeletal: Positive for back pain and arthralgias. Negative for neck pain.  Skin: Negative.   Neurological: Negative for dizziness, seizures and speech difficulty.  Psychiatric/Behavioral: Negative for hallucinations and confusion.       Depression      Allergies  Codeine  Home Medications   Prior to Admission medications   Medication Sig Start Date End Date Taking? Authorizing Provider  albuterol (PROVENTIL) (2.5 MG/3ML) 0.083% nebulizer solution Take 2.5 mg by nebulization 4 (four) times daily.     Historical Provider, MD  ALPRAZolam Duanne Moron) 1 MG tablet Take 1 mg by mouth 4 (four) times daily.      Historical Provider, MD  amLODipine (NORVASC) 10 MG tablet Take 10 mg by mouth daily.    Historical Provider, MD  dicyclomine (BENTYL) 10 MG capsule Take 1 capsule (10 mg total) by mouth 4 (four) times daily -  before meals and at bedtime. 02/09/14   Orvil Feil, NP  HYDROcodone-acetaminophen (NORCO) 10-325 MG per tablet Take 1 tablet by mouth every 6 (six) hours as needed for pain.    Historical Provider, MD  Insulin Aspart Prot & Aspart (NOVOLOG MIX 70/30 FLEXPEN) (70-30) 100 UNIT/ML Pen Inject 80 Units into the skin 2 (two) times daily.     Historical Provider, MD  lisinopril-hydrochlorothiazide (PRINZIDE,ZESTORETIC) 20-12.5 MG per tablet Take 1 tablet by mouth 2 (two) times daily.     Historical Provider, MD  metFORMIN (GLUCOPHAGE) 1000 MG tablet Take 1,000 mg by mouth 2 (two) times daily with a meal.    Historical Provider, MD  omeprazole  (PRILOSEC) 20 MG capsule Take 20 mg by mouth 2 (two) times daily as needed (acid reflux).     Historical Provider, MD  rOPINIRole (REQUIP) 1 MG tablet Take 1 mg by mouth at bedtime.     Historical Provider, MD  simvastatin (ZOCOR) 40 MG tablet Take 40 mg by mouth every evening.    Historical Provider, MD  UNKNOWN TO PATIENT Apply 1 application topically daily as needed (for irritation (vaginal area) COMPOUND MEDICATION: Ketoconazole mixed with another medication  (name is unknown)).    Historical Provider, MD   BP 135/80 mmHg  Pulse 97  Temp(Src) 99.3 F (37.4 C) (Oral)  Resp 20  Ht 5\' 4"  (1.626 m)  Wt 190 lb (86.183 kg)  BMI 32.60 kg/m2  SpO2 100% Physical Exam  Constitutional: She is oriented to person, place, and time. She appears well-developed and  well-nourished.  Non-toxic appearance.  HENT:  Head: Normocephalic.  Right Ear: Tympanic membrane and external ear normal.  Left Ear: Tympanic membrane and external ear normal.  Eyes: EOM and lids are normal. Pupils are equal, round, and reactive to light.  Neck: Normal range of motion. Neck supple. Carotid bruit is not present.  Cardiovascular: Normal rate, regular rhythm, normal heart sounds, intact distal pulses and normal pulses.   Pulmonary/Chest: Breath sounds normal. No respiratory distress.  Abdominal: Soft. Bowel sounds are normal. There is no tenderness. There is no guarding.  Musculoskeletal: Normal range of motion.  There is crepitus with attempted range of motion of the right knee. There no hot areas appreciated. There is tenderness to palpation at the lateral area of the patella. There is also tenderness to palpation over the anterior tibial tuberosity. No effusion appreciated. No mass behind the knee. There is good range of motion of the right hip, as well as the right ankle and toes. The dorsalis pedis pulses 2+.  Lymphadenopathy:       Head (right side): No submandibular adenopathy present.       Head (left side): No  submandibular adenopathy present.    She has no cervical adenopathy.  Neurological: She is alert and oriented to person, place, and time. She has normal strength. No cranial nerve deficit or sensory deficit.  Skin: Skin is warm and dry.  Psychiatric: She has a normal mood and affect. Her speech is normal.  Nursing note and vitals reviewed.   ED Course  Procedures (including critical care time) Labs Review Labs Reviewed - No data to display  Imaging Review No results found.   EKG Interpretation None      MDM  Vital signs are within normal limits. Examination of the right knee is consistent with degenerative joint disease changes. I discussed this with the patient in terms which he understands. Crutches and knee immobilizer were offered. The patient states that she is very clumsy with crutches and prefers not to have that right now. We further discussed the need for her to discuss this pain and discomfort with her physician to obtain an orthopedic evaluation. The patient is currently on medication for pain. Will add Celebrex 100 mg.    Final diagnoses:  None    *I have reviewed nursing notes, vital signs, and all appropriate lab and imaging results for this patient.[address] Pryor Montes, PA-C 07/23/14 2146  Veryl Speak, MD 07/23/14 [phone]

## 2014-07-23 NOTE — Discharge Instructions (Signed)
Arthritis, Nonspecific °Arthritis is inflammation of a joint. This usually means pain, redness, warmth or swelling are present. One or more joints may be involved. There are a number of types of arthritis. Your caregiver may not be able to tell what type of arthritis you have right away. °CAUSES  °The most common cause of arthritis is the wear and tear on the joint (osteoarthritis). This causes damage to the cartilage, which can break down over time. The knees, hips, back and neck are most often affected by this type of arthritis. °Other types of arthritis and common causes of joint pain include: °· Sprains and other injuries near the joint. Sometimes minor sprains and injuries cause pain and swelling that develop hours later. °· Rheumatoid arthritis. This affects hands, feet and knees. It usually affects both sides of your body at the same time. It is often associated with chronic ailments, fever, weight loss and general weakness. °· Crystal arthritis. Gout and pseudo gout can cause occasional acute severe pain, redness and swelling in the foot, ankle, or knee. °· Infectious arthritis. Bacteria can get into a joint through a break in overlying skin. This can cause infection of the joint. Bacteria and viruses can also spread through the blood and affect your joints. °· Drug, infectious and allergy reactions. Sometimes joints can become mildly painful and slightly swollen with these types of illnesses. °SYMPTOMS  °· Pain is the main symptom. °· Your joint or joints can also be red, swollen and warm or hot to the touch. °· You may have a fever with certain types of arthritis, or even feel overall ill. °· The joint with arthritis will hurt with movement. Stiffness is present with some types of arthritis. °DIAGNOSIS  °Your caregiver will suspect arthritis based on your description of your symptoms and on your exam. Testing may be needed to find the type of arthritis: °· Blood and sometimes urine tests. °· X-ray tests  and sometimes CT or MRI scans. °· Removal of fluid from the joint (arthrocentesis) is done to check for bacteria, crystals or other causes. Your caregiver (or a specialist) will numb the area over the joint with a local anesthetic, and use a needle to remove joint fluid for examination. This procedure is only minimally uncomfortable. °· Even with these tests, your caregiver may not be able to tell what kind of arthritis you have. Consultation with a specialist (rheumatologist) may be helpful. °TREATMENT  °Your caregiver will discuss with you treatment specific to your type of arthritis. If the specific type cannot be determined, then the following general recommendations may apply. °Treatment of severe joint pain includes: °· Rest. °· Elevation. °· Anti-inflammatory medication (for example, ibuprofen) may be prescribed. Avoiding activities that cause increased pain. °· Only take over-the-counter or prescription medicines for pain and discomfort as recommended by your caregiver. °· Cold packs over an inflamed joint may be used for 10 to 15 minutes every hour. Hot packs sometimes feel better, but do not use overnight. Do not use hot packs if you are diabetic without your caregiver's permission. °· A cortisone shot into arthritic joints may help reduce pain and swelling. °· Any acute arthritis that gets worse over the next 1 to 2 days needs to be looked at to be sure there is no joint infection. °Long-term arthritis treatment involves modifying activities and lifestyle to reduce joint stress jarring. This can include weight loss. Also, exercise is needed to nourish the joint cartilage and remove waste. This helps keep the muscles   around the joint strong. °HOME CARE INSTRUCTIONS  °· Do not take aspirin to relieve pain if gout is suspected. This elevates uric acid levels. °· Only take over-the-counter or prescription medicines for pain, discomfort or fever as directed by your caregiver. °· Rest the joint as much as  possible. °· If your joint is swollen, keep it elevated. °· Use crutches if the painful joint is in your leg. °· Drinking plenty of fluids may help for certain types of arthritis. °· Follow your caregiver's dietary instructions. °· Try low-impact exercise such as: °¨ Swimming. °¨ Water aerobics. °¨ Biking. °¨ Walking. °· Morning stiffness is often relieved by a warm shower. °· Put your joints through regular range-of-motion. °SEEK MEDICAL CARE IF:  °· You do not feel better in 24 hours or are getting worse. °· You have side effects to medications, or are not getting better with treatment. °SEEK IMMEDIATE MEDICAL CARE IF:  °· You have a fever. °· You develop severe joint pain, swelling or redness. °· Many joints are involved and become painful and swollen. °· There is severe back pain and/or leg weakness. °· You have loss of bowel or bladder control. °Document Released: 08/21/2004 Document Revised: 10/06/2011 Document Reviewed: 09/06/2008 °ExitCare® Patient Information ©2015 [employer]. This information is not intended to replace advice given to you by your health care provider. Make sure you discuss any questions you have with your health care provider. ° °

## 2014-07-23 NOTE — ED Notes (Addendum)
Patient c/o of right leg pain times two months. Denies injury. Was seen November for same complaint, ultrasound completed which was "fine". Pt c/o of progressively worsening pain, which is worse with palpitation.

## 2014-09-26 ENCOUNTER — Encounter: Payer: Self-pay | Admitting: Gastroenterology

## 2014-10-02 ENCOUNTER — Ambulatory Visit (HOSPITAL_COMMUNITY)
Admission: RE | Admit: 2014-10-02 | Discharge: 2014-10-02 | Disposition: A | Discharge status: Home / Self Care | Payer: Medicaid Other | Source: Ambulatory Visit | Attending: Pulmonary Disease | Admitting: Pulmonary Disease

## 2014-10-02 ENCOUNTER — Other Ambulatory Visit (HOSPITAL_COMMUNITY): Payer: Self-pay | Admitting: Pulmonary Disease

## 2014-10-02 DIAGNOSIS — M79605 Pain in left leg: Secondary | ICD-10-CM | POA: Insufficient documentation

## 2014-10-02 DIAGNOSIS — M79652 Pain in left thigh: Secondary | ICD-10-CM

## 2014-10-02 DIAGNOSIS — M1611 Unilateral primary osteoarthritis, right hip: Secondary | ICD-10-CM | POA: Diagnosis not present

## 2014-10-02 DIAGNOSIS — M79604 Pain in right leg: Secondary | ICD-10-CM | POA: Diagnosis not present

## 2014-10-02 DIAGNOSIS — M79651 Pain in right thigh: Secondary | ICD-10-CM

## 2014-10-02 DIAGNOSIS — M179 Osteoarthritis of knee, unspecified: Secondary | ICD-10-CM | POA: Insufficient documentation

## 2014-10-17 ENCOUNTER — Ambulatory Visit (INDEPENDENT_AMBULATORY_CARE_PROVIDER_SITE_OTHER): Payer: Medicaid Other | Admitting: Orthopedic Surgery

## 2014-10-17 ENCOUNTER — Encounter: Payer: Self-pay | Admitting: Orthopedic Surgery

## 2014-10-17 ENCOUNTER — Ambulatory Visit (INDEPENDENT_AMBULATORY_CARE_PROVIDER_SITE_OTHER): Payer: Medicaid Other

## 2014-10-17 VITALS — BP 148/96 | Ht 68.0 in | Wt 327.0 lb

## 2014-10-17 DIAGNOSIS — M129 Arthropathy, unspecified: Secondary | ICD-10-CM

## 2014-10-17 DIAGNOSIS — M545 Low back pain: Secondary | ICD-10-CM | POA: Diagnosis not present

## 2014-10-17 DIAGNOSIS — M170 Bilateral primary osteoarthritis of knee: Secondary | ICD-10-CM

## 2014-10-17 DIAGNOSIS — M160 Bilateral primary osteoarthritis of hip: Secondary | ICD-10-CM

## 2014-10-17 MED ORDER — DICLOFENAC SODIUM 75 MG PO TBEC: 75.0000 mg | DELAYED_RELEASE_TABLET | Freq: Two times a day (BID) | ORAL | Status: DC

## 2014-10-17 NOTE — Patient Instructions (Addendum)
Call [facility] to arrange therapy  Work on weight loss

## 2014-10-17 NOTE — Progress Notes (Signed)
Patient ID: Martha [name], female   DOB: [birth year], 49 y.o.   MRN: [id number]  Chief Complaint  Patient presents with  . Hip Pain    bilateral hip pain, REF. HAWKINS     Martha [name] is a 49 y.o. female.   HPI The patient was sent to [place] for evaluation of hip pain but actually says her knees hurt and her legs hurt and she also has chronic back pain. She is 49 years old she has chronic pain she has pain all over her body she has back pain bilateral knee pain bilateral hip pain she did have some films which I reviewed. Her x-ray seem to suggest she have more hip pathology the knee pathology but she says her knees ache. She is are ready on 10 mg of hydrocodone just been on that for 5 years she's tried Celebrex and meloxicam with no relief  She complains of sharp aching constant pain which is a 10 and does not match her x-rays. Review of Systems Sinus problems, vision problems breathing issues shortness of breath, ankle leg swelling, depression, excessive night urination, constipation diarrhea, dizziness, seasonal allergies joint pain and back pain  Past Medical History  Diagnosis Date  . Diabetes mellitus   . Hypertension   . RLS (restless legs syndrome)   . Acid reflux   . Bipolar 1 disorder   . Depression   . OSA (obstructive sleep apnea)     cpap  . Anemia   . Arthritis   . Degenerative disc disease, thoracic   . Shortness of breath   . Obesity (BMI 30-39.9)   . COPD (chronic obstructive pulmonary disease) 2014    bronchitis    Past Surgical History  Procedure Laterality Date  . Tubal ligation    . Endometrial ablation    . Colonoscopy  06/06/2010    [id number] bleeding secondary to internal hemorrhoids but incomplete evaluation secondary to poor right colon prep/small rectal and sigmoid colon polyps (hyperplastic). PROPOFOL  . Esophagogastroduodenoscopy  06/06/2010    [id number] erythema and edema of body of stomach, with sessile polypoid lesions. bx benign. no h.pylori   . Colonoscopy  May 2013    Dr. Gilliam/[facility]: 5 mm a descending colon polyp, hyperplastic. Adequate bowel prep.  . Flexible sigmoidoscopy N/A 05/17/2013    Dr. Oneida Alar: moderate sized internal hemorrhoids  . Esophagogastroduodenoscopy (egd) with propofol N/A 05/17/2013    Dr. Oneida Alar: normal esophagus, moderate nodular gastritis, negative path, empiric Savary dilation  . Savory dilation N/A 05/17/2013    Procedure: SAVORY DILATION;  Surgeon: [name], MD;  Location: [facility];  Service: Endoscopy;  Laterality: N/A;  14/15/16  . Hemorrhoid banding N/A 05/17/2013    Procedure: HEMORRHOID BANDING;  Surgeon: [name], MD;  Location: [facility];  Service: Endoscopy;  Laterality: N/A;  2 bands placed  . Esophageal biopsy N/A 05/17/2013    Procedure: BIOPSY;  Surgeon: [name], MD;  Location: [facility];  Service: Endoscopy;  Laterality: N/A;  . Irrigation and debridement abscess Right 06/01/2013    Procedure: INCISION AND DRAINAGE AND DEBRIDEMENT ABSCESS RIGHT BREAST;  Surgeon: [name], MD;  Location: [facility];  Service: General;  Laterality: Right;  . Incision and drainage abscess Left 10/11/2013    Procedure: INCISION AND DRAINAGE AND DEBRIDEMENT LEFT BREAST  ABSCESS;  Surgeon: [name], MD;  Location: [facility];  Service: General;  Laterality: Left;  . Cholecystectomy N/A 04/27/2014    Procedure: LAPAROSCOPIC CHOLECYSTECTOMY;  Surgeon: [name], MD;  Location: [facility];  Service: General;  Laterality: N/A;    Family History  Problem Relation Age of Onset  . Adopted: Yes    Social History History  Substance Use Topics  . Smoking status: Former Smoker -- 1.50 packs/day for 25 years    Types: Cigarettes    Quit date: 10/23/2007  . Smokeless tobacco: Former Systems developer     Comment: Quit x 6 years ago  . Alcohol Use: No    Allergies  Allergen Reactions  . Codeine Nausea And Vomiting    Tolerates the medication if taken with food    Current Outpatient  Prescriptions  Medication Sig Dispense Refill  . albuterol (PROVENTIL) (2.5 MG/3ML) 0.083% nebulizer solution Take 2.5 mg by nebulization 4 (four) times daily.     Marland Kitchen ALPRAZolam (XANAX) 1 MG tablet Take 1 mg by mouth 4 (four) times daily.      Marland Kitchen amLODipine (NORVASC) 10 MG tablet Take 10 mg by mouth daily.    . celecoxib (CELEBREX) 100 MG capsule Take 1 capsule (100 mg total) by mouth 2 (two) times daily. 14 capsule 0  . dicyclomine (BENTYL) 10 MG capsule Take 1 capsule (10 mg total) by mouth 4 (four) times daily -  before meals and at bedtime. 120 capsule 3  . DULoxetine (CYMBALTA) 60 MG capsule Take 60 mg by mouth daily.    Marland Kitchen gabapentin (NEURONTIN) 300 MG capsule Take 300 mg by mouth 3 (three) times daily.    Marland Kitchen HYDROcodone-acetaminophen (NORCO) 10-325 MG per tablet Take 1 tablet by mouth every 6 (six) hours as needed for pain.    . Insulin Aspart Prot & Aspart (NOVOLOG MIX 70/30 FLEXPEN) (70-30) 100 UNIT/ML Pen Inject 80 Units into the skin 2 (two) times daily.     Marland Kitchen lisinopril-hydrochlorothiazide (PRINZIDE,ZESTORETIC) 20-12.5 MG per tablet Take 1 tablet by mouth 2 (two) times daily.     . meloxicam (MOBIC) 15 MG tablet Take 15 mg by mouth daily.    . metFORMIN (GLUCOPHAGE) 1000 MG tablet Take 1,000 mg by mouth 2 (two) times daily with a meal.    . omeprazole (PRILOSEC) 20 MG capsule Take 20 mg by mouth 2 (two) times daily as needed (acid reflux).     Marland Kitchen rOPINIRole (REQUIP) 1 MG tablet Take 1 mg by mouth at bedtime.     . simvastatin (ZOCOR) 40 MG tablet Take 40 mg by mouth every evening.    . diclofenac (VOLTAREN) 75 MG EC tablet Take 1 tablet (75 mg total) by mouth 2 (two) times daily with a meal. 60 tablet 2   No current facility-administered medications for this visit.       Physical Exam Blood pressure 148/96, height 5\' 8"  (1.727 m), weight 327 lb (148.326 kg). Physical Exam The patient is well developed well nourished and well groomed. Orientation to person place and time is normal   Mood is pleasant. Ambulatory status no assistive devices used to help with ambulation Body mass index is 49.73 kg/(m^2).  As far as her upper extremities go she has no contracture subluxation atrophy tremor or edema or malalignment  She has painful range of motion and tenderness to palpation of both joint lines in both knees. Knees are stable and muscle strength and muscle tone are normal there are no skin lesions  Her lumbar spine is tender thoracic spine is tender thoracic spine has some kyphosis-no increase in muscle tension distally she has intact pulses with no edema groin  areas show no lymph node enlargement sensory exam is normal pathologic reflexes none balance is normal  Her straight leg raise on the left is equivocal because she isn't so much pain it's hard to figure out where she is actually complaining    Data Reviewed She had femur films which showed part of her hips which are degenerative bilaterally and she had partial imaging of the knee which shows mild arthritis and again doesn't explain her pain levels Lumbar spine shows degenerative arthritis  Assessment Encounter Diagnoses  Name Primary?  . Low back pain, unspecified back pain laterality, with sciatica presence unspecified Yes  . Bilateral hip joint arthritis   . Osteoarthritis of both knees, unspecified osteoarthritis type     Plan The first thing that needs to be done is to get control of her chronic pain. She needs to see a therapist for lumbar stabilization before anything can be done with her lower extremities. Her pain in her knees is out of proportion to her x-rays and clinical findings and I suspect this is related to her ongoing back problems  Recommend getting her BMI down to 40 I also recommend physical therapist When we get her BMI down to 40 week and reassess her for surgical intervention versus injection knee.

## 2014-12-19 ENCOUNTER — Encounter (HOSPITAL_COMMUNITY): Payer: Self-pay | Admitting: *Deleted

## 2014-12-19 ENCOUNTER — Emergency Department (HOSPITAL_COMMUNITY)
Admission: EM | Admit: 2014-12-19 | Discharge: 2014-12-19 | Disposition: A | Discharge status: Home / Self Care | Payer: Medicaid Other | Attending: Emergency Medicine | Admitting: Emergency Medicine

## 2014-12-19 DIAGNOSIS — R591 Generalized enlarged lymph nodes: Secondary | ICD-10-CM

## 2014-12-19 DIAGNOSIS — Z862 Personal history of diseases of the blood and blood-forming organs and certain disorders involving the immune mechanism: Secondary | ICD-10-CM | POA: Diagnosis not present

## 2014-12-19 DIAGNOSIS — Z791 Long term (current) use of non-steroidal anti-inflammatories (NSAID): Secondary | ICD-10-CM | POA: Diagnosis not present

## 2014-12-19 DIAGNOSIS — Z87891 Personal history of nicotine dependence: Secondary | ICD-10-CM | POA: Insufficient documentation

## 2014-12-19 DIAGNOSIS — E669 Obesity, unspecified: Secondary | ICD-10-CM | POA: Diagnosis not present

## 2014-12-19 DIAGNOSIS — R519 Headache, unspecified: Secondary | ICD-10-CM

## 2014-12-19 DIAGNOSIS — K219 Gastro-esophageal reflux disease without esophagitis: Secondary | ICD-10-CM | POA: Insufficient documentation

## 2014-12-19 DIAGNOSIS — G2581 Restless legs syndrome: Secondary | ICD-10-CM | POA: Diagnosis not present

## 2014-12-19 DIAGNOSIS — M1990 Unspecified osteoarthritis, unspecified site: Secondary | ICD-10-CM | POA: Diagnosis not present

## 2014-12-19 DIAGNOSIS — E119 Type 2 diabetes mellitus without complications: Secondary | ICD-10-CM | POA: Diagnosis not present

## 2014-12-19 DIAGNOSIS — R51 Headache: Secondary | ICD-10-CM | POA: Diagnosis not present

## 2014-12-19 DIAGNOSIS — Z79899 Other long term (current) drug therapy: Secondary | ICD-10-CM | POA: Insufficient documentation

## 2014-12-19 DIAGNOSIS — G4733 Obstructive sleep apnea (adult) (pediatric): Secondary | ICD-10-CM | POA: Insufficient documentation

## 2014-12-19 DIAGNOSIS — F319 Bipolar disorder, unspecified: Secondary | ICD-10-CM | POA: Diagnosis not present

## 2014-12-19 DIAGNOSIS — Z9981 Dependence on supplemental oxygen: Secondary | ICD-10-CM | POA: Insufficient documentation

## 2014-12-19 DIAGNOSIS — I10 Essential (primary) hypertension: Secondary | ICD-10-CM | POA: Insufficient documentation

## 2014-12-19 DIAGNOSIS — J449 Chronic obstructive pulmonary disease, unspecified: Secondary | ICD-10-CM | POA: Diagnosis not present

## 2014-12-19 DIAGNOSIS — R590 Localized enlarged lymph nodes: Secondary | ICD-10-CM | POA: Diagnosis not present

## 2014-12-19 MED ORDER — CLINDAMYCIN HCL 150 MG PO CAPS
300.0000 mg | ORAL_CAPSULE | Freq: Once | ORAL | Status: AC
  Administered 2014-12-19: 300 mg via ORAL
  Filled 2014-12-19: qty 2

## 2014-12-19 MED ORDER — CLINDAMYCIN HCL 300 MG PO CAPS: 300.0000 mg | ORAL_CAPSULE | Freq: Four times a day (QID) | ORAL | Status: DC

## 2014-12-19 MED ORDER — CLINDAMYCIN HCL 150 MG PO CAPS
ORAL_CAPSULE | ORAL | Status: AC
  Filled 2014-12-19: qty 1

## 2014-12-19 MED ORDER — IBUPROFEN 800 MG PO TABS
800.0000 mg | ORAL_TABLET | Freq: Once | ORAL | Status: AC
  Administered 2014-12-19: 800 mg via ORAL
  Filled 2014-12-19: qty 1

## 2014-12-19 NOTE — ED Provider Notes (Signed)
CSN: [id number]     Arrival date & time 12/19/14  1920 History  This chart was scribed for [name], MD by Jeanell Sparrow, ED Scribe. This patient was seen in room APA01/APA01 and the patient's care was started at 8:08 PM.   Chief Complaint  Patient presents with  . Headache   Patient is a 49 y.o. female presenting with headaches. The history is provided by the patient. No language interpreter was used.  Headache Pain location:  Occipital Radiates to:  L neck and R neck Duration:  1 week Timing:  Constant Progression:  Unchanged Chronicity:  New Similar to prior headaches: yes   Relieved by:  None tried Worsened by:  Neck movement Ineffective treatments:  None tried Associated symptoms: no ear pain, no fever, no numbness, no vomiting and no weakness    HPI Comments: Martha [name] is a 49 y.o. female who presents to the Emergency Department complaining of constant moderate occipital headache that started about a week ago. She states that she has been having a headache for about a week, but the reason she came to the ED today was because she noticed a "knot" to the back of her neck. She reports that she suspects the headache to be from her HTN. She states that moving her neck upward exacerbates the pain. She denies any hx of stroke, head injury, or brain surgery. She also denies any fever, vomiting, visual disturbance, weakness, numbness, chest pain, SOB, or ear pain.   Past Medical History  Diagnosis Date  . Diabetes mellitus   . Hypertension   . RLS (restless legs syndrome)   . Acid reflux   . Bipolar 1 disorder   . Depression   . OSA (obstructive sleep apnea)     cpap  . Anemia   . Arthritis   . Degenerative disc disease, thoracic   . Shortness of breath   . Obesity (BMI 30-39.9)   . COPD (chronic obstructive pulmonary disease) 2014    bronchitis   Past Surgical History  Procedure Laterality Date  . Tubal ligation    . Endometrial ablation    . Colonoscopy   06/06/2010    [id number] bleeding secondary to internal hemorrhoids but incomplete evaluation secondary to poor right colon prep/small rectal and sigmoid colon polyps (hyperplastic). PROPOFOL  . Esophagogastroduodenoscopy  06/06/2010    [id number] erythema and edema of body of stomach, with sessile polypoid lesions. bx benign. no h.pylori  . Colonoscopy  May 2013    Dr. Gilliam/[facility]: 5 mm a descending colon polyp, hyperplastic. Adequate bowel prep.  . Flexible sigmoidoscopy N/A 05/17/2013    Dr. Oneida Alar: moderate sized internal hemorrhoids  . Esophagogastroduodenoscopy (egd) with propofol N/A 05/17/2013    Dr. Oneida Alar: normal esophagus, moderate nodular gastritis, negative path, empiric Savary dilation  . Savory dilation N/A 05/17/2013    Procedure: SAVORY DILATION;  Surgeon: [name], MD;  Location: [facility];  Service: Endoscopy;  Laterality: N/A;  14/15/16  . Hemorrhoid banding N/A 05/17/2013    Procedure: HEMORRHOID BANDING;  Surgeon: [name], MD;  Location: [facility];  Service: Endoscopy;  Laterality: N/A;  2 bands placed  . Esophageal biopsy N/A 05/17/2013    Procedure: BIOPSY;  Surgeon: [name], MD;  Location: [facility];  Service: Endoscopy;  Laterality: N/A;  . Irrigation and debridement abscess Right 06/01/2013    Procedure: INCISION AND DRAINAGE AND DEBRIDEMENT ABSCESS RIGHT BREAST;  Surgeon: [name], MD;  Location: [facility];  Service:  General;  Laterality: Right;  . Incision and drainage abscess Left 10/11/2013    Procedure: INCISION AND DRAINAGE AND DEBRIDEMENT LEFT BREAST  ABSCESS;  Surgeon: [name], MD;  Location: [facility];  Service: General;  Laterality: Left;  . Cholecystectomy N/A 04/27/2014    Procedure: LAPAROSCOPIC CHOLECYSTECTOMY;  Surgeon: [name], MD;  Location: [facility];  Service: General;  Laterality: N/A;   Family History  Problem Relation Age of Onset  . Adopted: Yes   History  Substance Use Topics  . Smoking status: Former  Smoker -- 1.50 packs/day for 25 years    Types: Cigarettes    Quit date: 10/23/2007  . Smokeless tobacco: Former Systems developer     Comment: Quit x 6 years ago  . Alcohol Use: No   OB History    Gravida Para Term Preterm AB TAB SAB Ectopic Multiple Living   2 2 1 1      2      Review of Systems  Constitutional: Negative for fever.  HENT: Negative for ear pain.   Eyes: Negative for visual disturbance.  Respiratory: Negative for shortness of breath.   Cardiovascular: Negative for chest pain.  Gastrointestinal: Negative for vomiting.  Neurological: Positive for headaches. Negative for weakness and numbness.  All other systems reviewed and are negative.   Allergies  Codeine  Home Medications   Prior to Admission medications   Medication Sig Start Date End Date Taking? Authorizing Provider  albuterol (PROVENTIL) (2.5 MG/3ML) 0.083% nebulizer solution Take 2.5 mg by nebulization 4 (four) times daily.     Historical Provider, MD  ALPRAZolam Duanne Moron) 1 MG tablet Take 1 mg by mouth 4 (four) times daily.      Historical Provider, MD  amLODipine (NORVASC) 10 MG tablet Take 10 mg by mouth daily.    Historical Provider, MD  celecoxib (CELEBREX) 100 MG capsule Take 1 capsule (100 mg total) by mouth 2 (two) times daily. 07/23/14   Lily Kocher, PA-C  diclofenac (VOLTAREN) 75 MG EC tablet Take 1 tablet (75 mg total) by mouth 2 (two) times daily with a meal. 10/17/14   Carole Civil, MD  dicyclomine (BENTYL) 10 MG capsule Take 1 capsule (10 mg total) by mouth 4 (four) times daily -  before meals and at bedtime. 02/09/14   Orvil Feil, NP  DULoxetine (CYMBALTA) 60 MG capsule Take 60 mg by mouth daily.    Historical Provider, MD  gabapentin (NEURONTIN) 300 MG capsule Take 300 mg by mouth 3 (three) times daily.    Historical Provider, MD  HYDROcodone-acetaminophen (NORCO) 10-325 MG per tablet Take 1 tablet by mouth every 6 (six) hours as needed for pain.    Historical Provider, MD  Insulin Aspart Prot &  Aspart (NOVOLOG MIX 70/30 FLEXPEN) (70-30) 100 UNIT/ML Pen Inject 80 Units into the skin 2 (two) times daily.     Historical Provider, MD  lisinopril-hydrochlorothiazide (PRINZIDE,ZESTORETIC) 20-12.5 MG per tablet Take 1 tablet by mouth 2 (two) times daily.     Historical Provider, MD  meloxicam (MOBIC) 15 MG tablet Take 15 mg by mouth daily.    Historical Provider, MD  metFORMIN (GLUCOPHAGE) 1000 MG tablet Take 1,000 mg by mouth 2 (two) times daily with a meal.    Historical Provider, MD  omeprazole (PRILOSEC) 20 MG capsule Take 20 mg by mouth 2 (two) times daily as needed (acid reflux).     Historical Provider, MD  rOPINIRole (REQUIP) 1 MG tablet Take 1 mg by mouth at  bedtime.     Historical Provider, MD  simvastatin (ZOCOR) 40 MG tablet Take 40 mg by mouth every evening.    Historical Provider, MD   BP 164/108 mmHg  Pulse 79  Temp(Src) 97.9 F (36.6 C) (Oral)  Resp 20  Ht 5\' 11"  (1.803 m)  Wt 350 lb (158.759 kg)  BMI 48.84 kg/m2  SpO2 99% Physical Exam  Nursing note and vitals reviewed. CONSTITUTIONAL: Well developed/well nourished HEAD: Normocephalic/atraumatic EYES: EOMI/PERRL, no nystagmus, no ptosis, ENMT: Mucous membranes moist, right TM intact/clear.  No stridor is noted NECK: supple no meningeal signs, no bruits, tender lymphadenopathy right posterior neck no overlying erythema noted SPINE/BACK:entire spine nontender CV: S1/S2 noted, no murmurs/rubs/gallops noted LUNGS: Lungs are clear to auscultation bilaterally, no apparent distress ABDOMEN: soft, nontender, no rebound or guarding GU:no cva tenderness NEURO:Awake/alert, facies symmetric, no arm or leg drift is noted Equal 5/5 strength with shoulder abduction, elbow flex/extension, wrist flex/extension in upper extremities and equal hand grips bilaterally Equal 5/5 strength with hip flexion,knee flex/extension, foot dorsi/plantar flexion Cranial nerves 3/4/5/6/02/02/09/11/12 tested and intact Gait normal without ataxia No  past pointing Sensation to light touch intact in all extremities EXTREMITIES: pulses normal, full ROM SKIN: warm, color normal PSYCH: no abnormalities of mood noted, alert and oriented to situation  ED Course  Procedures  DIAGNOSTIC STUDIES: Oxygen Saturation is 99% on RA, normal by my interpretation.    COORDINATION OF CARE: 8:12 PM- Pt advised of plan for treatment which includes medication and pt agrees.  Pt with painful lymphadenopathy She has no neuro deficits No other lymphadenopathy noted on my exam Will start oral clindamycin and advised PCP followup if no improvement with ABX as this could represent cancer  She reports her PCP is already ordering CT head as outpatient I don't feel she requires emergent CT head at this time  MDM   Final diagnoses:  Headache, unspecified headache type  Lymphadenopathy    Nursing notes including past medical history and social history reviewed and considered in documentation  I personally performed the services described in this documentation, which was scribed in my presence. The recorded information has been reviewed and is accurate.       [name], MD 12/19/14 2151

## 2014-12-19 NOTE — ED Notes (Signed)
Pt c/o HA for a week on right side and noticed a knot to right side of neck today, seen PCP earlier today but did not noticed knot until after seeing PCP, also c/o left eye twitching, per pt PCP was going to refer pt to a specialist

## 2014-12-19 NOTE — ED Notes (Signed)
Patient with continued HA complaints at this time. Respirations even and unlabored. Skin warm/dry. Discharge instructions reviewed with patient at this time. Patient given opportunity to voice concerns/ask questions. Patient discharged at this time and left Emergency Department with steady gait.

## 2014-12-20 ENCOUNTER — Other Ambulatory Visit (HOSPITAL_COMMUNITY): Payer: Self-pay | Admitting: General Surgery

## 2014-12-20 DIAGNOSIS — Z1231 Encounter for screening mammogram for malignant neoplasm of breast: Secondary | ICD-10-CM

## 2014-12-22 ENCOUNTER — Encounter (HOSPITAL_COMMUNITY): Payer: Self-pay | Admitting: Emergency Medicine

## 2014-12-22 ENCOUNTER — Emergency Department (HOSPITAL_COMMUNITY)
Admission: EM | Admit: 2014-12-22 | Discharge: 2014-12-22 | Disposition: A | Discharge status: Home / Self Care | Payer: Medicaid Other | Attending: Emergency Medicine | Admitting: Emergency Medicine

## 2014-12-22 DIAGNOSIS — M1990 Unspecified osteoarthritis, unspecified site: Secondary | ICD-10-CM | POA: Diagnosis not present

## 2014-12-22 DIAGNOSIS — R739 Hyperglycemia, unspecified: Secondary | ICD-10-CM

## 2014-12-22 DIAGNOSIS — F319 Bipolar disorder, unspecified: Secondary | ICD-10-CM | POA: Insufficient documentation

## 2014-12-22 DIAGNOSIS — I10 Essential (primary) hypertension: Secondary | ICD-10-CM | POA: Insufficient documentation

## 2014-12-22 DIAGNOSIS — Z79899 Other long term (current) drug therapy: Secondary | ICD-10-CM | POA: Insufficient documentation

## 2014-12-22 DIAGNOSIS — G2581 Restless legs syndrome: Secondary | ICD-10-CM | POA: Insufficient documentation

## 2014-12-22 DIAGNOSIS — K219 Gastro-esophageal reflux disease without esophagitis: Secondary | ICD-10-CM | POA: Insufficient documentation

## 2014-12-22 DIAGNOSIS — J449 Chronic obstructive pulmonary disease, unspecified: Secondary | ICD-10-CM | POA: Insufficient documentation

## 2014-12-22 DIAGNOSIS — Z9981 Dependence on supplemental oxygen: Secondary | ICD-10-CM | POA: Insufficient documentation

## 2014-12-22 DIAGNOSIS — R51 Headache: Secondary | ICD-10-CM | POA: Diagnosis not present

## 2014-12-22 DIAGNOSIS — Z792 Long term (current) use of antibiotics: Secondary | ICD-10-CM | POA: Diagnosis not present

## 2014-12-22 DIAGNOSIS — Z87891 Personal history of nicotine dependence: Secondary | ICD-10-CM | POA: Diagnosis not present

## 2014-12-22 DIAGNOSIS — Z862 Personal history of diseases of the blood and blood-forming organs and certain disorders involving the immune mechanism: Secondary | ICD-10-CM | POA: Diagnosis not present

## 2014-12-22 DIAGNOSIS — Z791 Long term (current) use of non-steroidal anti-inflammatories (NSAID): Secondary | ICD-10-CM | POA: Diagnosis not present

## 2014-12-22 DIAGNOSIS — R519 Headache, unspecified: Secondary | ICD-10-CM

## 2014-12-22 DIAGNOSIS — E1165 Type 2 diabetes mellitus with hyperglycemia: Secondary | ICD-10-CM | POA: Insufficient documentation

## 2014-12-22 DIAGNOSIS — E669 Obesity, unspecified: Secondary | ICD-10-CM | POA: Diagnosis not present

## 2014-12-22 LAB — BASIC METABOLIC PANEL
Anion gap: 14 (ref 5–15)
BUN: 30 mg/dL — ABNORMAL HIGH (ref 6–20)
CO2: 26 mmol/L (ref 22–32)
Calcium: 10.1 mg/dL (ref 8.9–10.3)
Chloride: 89 mmol/L — ABNORMAL LOW (ref 101–111)
Creatinine, Ser: 1.96 mg/dL — ABNORMAL HIGH (ref 0.44–1.00)
GFR calc Af Amer: 34 mL/min — ABNORMAL LOW (ref >60)
GFR calc non Af Amer: 29 mL/min — ABNORMAL LOW (ref >60)
Glucose, Bld: 574 mg/dL (ref 65–99)
Potassium: 4.5 mmol/L (ref 3.5–5.1)
Sodium: 129 mmol/L — ABNORMAL LOW (ref 135–145)

## 2014-12-22 LAB — CBC WITH DIFFERENTIAL/PLATELET
Basophils Absolute: 0.1 10*3/uL (ref 0.0–0.1)
Basophils Relative: 1 % (ref 0–1)
Eosinophils Absolute: 0.2 10*3/uL (ref 0.0–0.7)
Eosinophils Relative: 2 % (ref 0–5)
HCT: 39.2 % (ref 36.0–46.0)
Hemoglobin: 12.9 g/dL (ref 12.0–15.0)
Lymphocytes Relative: 38 % (ref 12–46)
Lymphs Abs: 2.6 10*3/uL (ref 0.7–4.0)
MCH: 28.1 pg (ref 26.0–34.0)
MCHC: 32.9 g/dL (ref 30.0–36.0)
MCV: 85.4 fL (ref 78.0–100.0)
Monocytes Absolute: 0.4 10*3/uL (ref 0.1–1.0)
Monocytes Relative: 6 % (ref 3–12)
Neutro Abs: 3.6 10*3/uL (ref 1.7–7.7)
Neutrophils Relative %: 53 % (ref 43–77)
Platelets: 210 10*3/uL (ref 150–400)
RBC: 4.59 MIL/uL (ref 3.87–5.11)
RDW: 13.6 % (ref 11.5–15.5)
WBC: 6.7 10*3/uL (ref 4.0–10.5)

## 2014-12-22 LAB — CBG MONITORING, ED
Glucose-Capillary: 560 mg/dL (ref 65–99)
Glucose-Capillary: 417 mg/dL — ABNORMAL HIGH (ref 65–99)
Glucose-Capillary: 292 mg/dL — ABNORMAL HIGH (ref 65–99)

## 2014-12-22 MED ORDER — MORPHINE SULFATE 4 MG/ML IJ SOLN
6.0000 mg | Freq: Once | INTRAMUSCULAR | Status: AC
  Administered 2014-12-22: 6 mg via INTRAVENOUS
  Filled 2014-12-22: qty 2

## 2014-12-22 MED ORDER — SODIUM CHLORIDE 0.9 % IV BOLUS (SEPSIS)
1000.0000 mL | Freq: Once | INTRAVENOUS | Status: AC
  Administered 2014-12-22: 1000 mL via INTRAVENOUS

## 2014-12-22 MED ORDER — METOCLOPRAMIDE HCL 5 MG/ML IJ SOLN
10.0000 mg | Freq: Once | INTRAMUSCULAR | Status: AC
  Administered 2014-12-22: 10 mg via INTRAVENOUS
  Filled 2014-12-22: qty 2

## 2014-12-22 MED ORDER — MORPHINE SULFATE 4 MG/ML IJ SOLN
4.0000 mg | Freq: Once | INTRAMUSCULAR | Status: AC
  Administered 2014-12-22: 4 mg via INTRAVENOUS
  Filled 2014-12-22: qty 1

## 2014-12-22 MED ORDER — DIPHENHYDRAMINE HCL 50 MG/ML IJ SOLN
12.5000 mg | Freq: Once | INTRAMUSCULAR | Status: AC
  Administered 2014-12-22: 12.5 mg via INTRAVENOUS
  Filled 2014-12-22: qty 1

## 2014-12-22 MED ORDER — INSULIN ASPART 100 UNIT/ML ~~LOC~~ SOLN
15.0000 [IU] | Freq: Once | SUBCUTANEOUS | Status: AC
  Administered 2014-12-22: 15 [IU] via INTRAVENOUS
  Filled 2014-12-22: qty 1

## 2014-12-22 MED ORDER — INSULIN ASPART 100 UNIT/ML ~~LOC~~ SOLN
10.0000 [IU] | Freq: Once | SUBCUTANEOUS | Status: AC
  Administered 2014-12-22: 10 [IU] via INTRAVENOUS
  Filled 2014-12-22: qty 1

## 2014-12-22 NOTE — ED Notes (Signed)
Pt c/o headache since 5/24, seen here for same on that day. Pt states she has continued to have a headache. Reports emesis that started on Wed.

## 2014-12-22 NOTE — ED Provider Notes (Signed)
CSN: [id number]     Arrival date & time 12/22/14  0920 History  This chart was scribed for Virgel Manifold, MD by Stephania Fragmin, ED Scribe. This patient was seen in room APA01/APA01 and the patient's care was started at 10:46 AM.     Chief Complaint  Patient presents with  . Headache   Patient is a 49 y.o. female presenting with headaches. The history is provided by the patient. No language interpreter was used.  Headache Pain location:  Occipital Radiates to:  L neck and R neck Duration:  1 week Timing:  Constant Progression:  Unchanged Chronicity:  New Relieved by:  Nothing Worsened by:  Nothing Ineffective treatments:  Prescription medications Associated symptoms: blurred vision, nausea, swollen glands and vomiting      HPI Comments: Martha [name] is a 49 y.o. female who presents to the Emergency Department complaining of a constant headache that began about 1 week ago. She also complains of "bumps" on her back of her head/neck and intermittent blurred vision. She was seen 3 days ago for the same and was given antibiotics, which she has taken, and told to return if she develops nausea and vomiting, which began 2 days ago. Patient states she has never had headaches like this before. She also mentions that her blood sugar is high today, at 560, although she takes DM medications regularly. Her sugars at home are typically in the 400s. Patient has been taking oxycodone for her pain. She denies any otalgia.   Past Medical History  Diagnosis Date  . Diabetes mellitus   . Hypertension   . RLS (restless legs syndrome)   . Acid reflux   . Bipolar 1 disorder   . Depression   . OSA (obstructive sleep apnea)     cpap  . Anemia   . Arthritis   . Degenerative disc disease, thoracic   . Shortness of breath   . Obesity (BMI 30-39.9)   . COPD (chronic obstructive pulmonary disease) 2014    bronchitis   Past Surgical History  Procedure Laterality Date  . Tubal ligation    . Endometrial  ablation    . Colonoscopy  06/06/2010    [id number] bleeding secondary to internal hemorrhoids but incomplete evaluation secondary to poor right colon prep/small rectal and sigmoid colon polyps (hyperplastic). PROPOFOL  . Esophagogastroduodenoscopy  06/06/2010    [id number] erythema and edema of body of stomach, with sessile polypoid lesions. bx benign. no h.pylori  . Colonoscopy  May 2013    Dr. Gilliam/[facility]: 5 mm a descending colon polyp, hyperplastic. Adequate bowel prep.  . Flexible sigmoidoscopy N/A 05/17/2013    Dr. Oneida Alar: moderate sized internal hemorrhoids  . Esophagogastroduodenoscopy (egd) with propofol N/A 05/17/2013    Dr. Oneida Alar: normal esophagus, moderate nodular gastritis, negative path, empiric Savary dilation  . Savory dilation N/A 05/17/2013    Procedure: SAVORY DILATION;  Surgeon: [name], MD;  Location: [facility];  Service: Endoscopy;  Laterality: N/A;  14/15/16  . Hemorrhoid banding N/A 05/17/2013    Procedure: HEMORRHOID BANDING;  Surgeon: [name], MD;  Location: [facility];  Service: Endoscopy;  Laterality: N/A;  2 bands placed  . Esophageal biopsy N/A 05/17/2013    Procedure: BIOPSY;  Surgeon: [name], MD;  Location: [facility];  Service: Endoscopy;  Laterality: N/A;  . Irrigation and debridement abscess Right 06/01/2013    Procedure: INCISION AND DRAINAGE AND DEBRIDEMENT ABSCESS RIGHT BREAST;  Surgeon: [name], MD;  Location: [facility];  Service: General;  Laterality: Right;  . Incision and drainage abscess Left 10/11/2013    Procedure: INCISION AND DRAINAGE AND DEBRIDEMENT LEFT BREAST  ABSCESS;  Surgeon: [name], MD;  Location: [facility];  Service: General;  Laterality: Left;  . Cholecystectomy N/A 04/27/2014    Procedure: LAPAROSCOPIC CHOLECYSTECTOMY;  Surgeon: [name], MD;  Location: [facility];  Service: General;  Laterality: N/A;   Family History  Problem Relation Age of Onset  . Adopted: Yes  . Family history unknown: Yes    History  Substance Use Topics  . Smoking status: Former Smoker -- 1.50 packs/day for 25 years    Types: Cigarettes    Quit date: 10/23/2007  . Smokeless tobacco: Former Systems developer     Comment: Quit x 6 years ago  . Alcohol Use: No   OB History    Gravida Para Term Preterm AB TAB SAB Ectopic Multiple Living   2 2 1 1      2      Review of Systems  Eyes: Positive for blurred vision and visual disturbance.  Gastrointestinal: Positive for nausea and vomiting.  Neurological: Positive for headaches.  All other systems reviewed and are negative.   Allergies  Codeine  Home Medications   Prior to Admission medications   Medication Sig Start Date End Date Taking? Authorizing Provider  albuterol (PROVENTIL) (2.5 MG/3ML) 0.083% nebulizer solution Take 2.5 mg by nebulization 4 (four) times daily.    Yes Historical Provider, MD  ALPRAZolam Duanne Moron) 1 MG tablet Take 1 mg by mouth 4 (four) times daily.     Yes Historical Provider, MD  amLODipine (NORVASC) 10 MG tablet Take 10 mg by mouth daily.   Yes Historical Provider, MD  calcium carbonate (TUMS - DOSED IN MG ELEMENTAL CALCIUM) 500 MG chewable tablet Chew 2 tablets by mouth daily as needed for indigestion or heartburn.   Yes Historical Provider, MD  celecoxib (CELEBREX) 100 MG capsule Take 1 capsule (100 mg total) by mouth 2 (two) times daily. 07/23/14  Yes Lily Kocher, PA-C  clindamycin (CLEOCIN) 300 MG capsule Take 1 capsule (300 mg total) by mouth 4 (four) times daily. X 7 days 12/19/14  Yes [name], MD  diclofenac (VOLTAREN) 75 MG EC tablet Take 1 tablet (75 mg total) by mouth 2 (two) times daily with a meal. 10/17/14  Yes Carole Civil, MD  dicyclomine (BENTYL) 10 MG capsule Take 1 capsule (10 mg total) by mouth 4 (four) times daily -  before meals and at bedtime. 02/09/14  Yes Orvil Feil, NP  DULoxetine (CYMBALTA) 60 MG capsule Take 60 mg by mouth daily.   Yes Historical Provider, MD  gabapentin (NEURONTIN) 300 MG capsule Take  300 mg by mouth 3 (three) times daily.   Yes Historical Provider, MD  HYDROcodone-acetaminophen (NORCO) 10-325 MG per tablet Take 1 tablet by mouth every 6 (six) hours as needed for pain.   Yes Historical Provider, MD  lisinopril-hydrochlorothiazide (PRINZIDE,ZESTORETIC) 20-12.5 MG per tablet Take 1 tablet by mouth 2 (two) times daily.    Yes Historical Provider, MD  meloxicam (MOBIC) 15 MG tablet Take 15 mg by mouth daily.   Yes Historical Provider, MD  metFORMIN (GLUCOPHAGE) 1000 MG tablet Take 1,000 mg by mouth 2 (two) times daily with a meal.   Yes Historical Provider, MD  omeprazole (PRILOSEC) 20 MG capsule Take 20 mg by mouth 2 (two) times daily as needed (acid reflux).    Yes Historical Provider, MD  rOPINIRole (REQUIP)  1 MG tablet Take 1 mg by mouth at bedtime.    Yes Historical Provider, MD  simvastatin (ZOCOR) 40 MG tablet Take 40 mg by mouth every evening.   Yes Historical Provider, MD   BP 147/95 mmHg  Pulse 92  Temp(Src) 98.2 F (36.8 C) (Oral)  Resp 22  Ht 5\' 10"  (1.778 m)  Wt 350 lb (158.759 kg)  BMI 50.22 kg/m2  SpO2 93% Physical Exam  Constitutional: She is oriented to person, place, and time. She appears well-developed and well-nourished. No distress.  HENT:  Head: Normocephalic and atraumatic.  TMs are clear bilaterally. Posterior pharynx is clear.   Eyes: Conjunctivae are normal. Right eye exhibits no discharge. Left eye exhibits no discharge.  Neck: Neck supple.  Tender right sided posterior cervical adenopathy. No overlying skin changes.   Cardiovascular: Normal rate, regular rhythm and normal heart sounds.  Exam reveals no gallop and no friction rub.   No murmur heard. Pulmonary/Chest: Effort normal and breath sounds normal. No respiratory distress.  Abdominal: Soft. She exhibits no distension. There is no tenderness.  Musculoskeletal: She exhibits no edema or tenderness.  Neurological: She is alert and oriented to person, place, and time. No cranial nerve  deficit. She exhibits normal muscle tone. Coordination normal.  Skin: Skin is warm and dry.  Psychiatric: She has a normal mood and affect. Her behavior is normal. Thought content normal.  Nursing note and vitals reviewed.   ED Course  Procedures (including critical care time)  DIAGNOSTIC STUDIES: Oxygen Saturation is 93% on RA, normal by my interpretation.    COORDINATION OF CARE: 10:50 AM - Discussed treatment plan with pt at bedside which includes IV, pain medication, and nausea medication; and pt agreed to plan.   Labs Review Labs Reviewed  BASIC METABOLIC PANEL - Abnormal; Notable for the following:    Sodium 129 (*)    Chloride 89 (*)    Glucose, Bld 574 (*)    BUN 30 (*)    Creatinine, Ser 1.96 (*)    GFR calc non Af Amer 29 (*)    GFR calc Af Amer 34 (*)    All other components within normal limits  CBG MONITORING, ED - Abnormal; Notable for the following:    Glucose-Capillary 560 (*)    All other components within normal limits  CBG MONITORING, ED - Abnormal; Notable for the following:    Glucose-Capillary 417 (*)    All other components within normal limits  CBC WITH DIFFERENTIAL/PLATELET    Imaging Review No results found.   EKG Interpretation None      MDM   Final diagnoses:  Hyperglycemia  Nonintractable headache, unspecified chronicity pattern, unspecified headache type   48yf with HA and hyperglycemia. R sided posterior cervical adenopathy w/o overlying skin changes. Currently on abx. Neck is supple. Afebrile and nontoxic appearance. On clindamycin which was prescribed 5/24. Will have her continue this. Reiterated need for follow-up, particularly if adenopathy does not resolve.   I personally preformed the services scribed in my presence. The recorded information has been reviewed is accurate. Virgel Manifold, MD.    Virgel Manifold, MD 12/26/14 [phone]

## 2014-12-22 NOTE — Discharge Instructions (Signed)
Headaches, Frequently Asked Questions MIGRAINE HEADACHES Q: What is migraine? What causes it? How can I treat it? A: Generally, migraine headaches begin as a dull ache. Then they develop into a constant, throbbing, and pulsating pain. You may experience pain at the temples. You may experience pain at the front or back of one or both sides of the head. The pain is usually accompanied by a combination of:  Nausea.  Vomiting.  Sensitivity to light and noise. Some people (about 15%) experience an aura (see below) before an attack. The cause of migraine is believed to be chemical reactions in the brain. Treatment for migraine may include over-the-counter or prescription medications. It may also include self-help techniques. These include relaxation training and biofeedback.  Q: What is an aura? A: About 15% of people with migraine get an "aura". This is a sign of neurological symptoms that occur before a migraine headache. You may see wavy or jagged lines, dots, or flashing lights. You might experience tunnel vision or blind spots in one or both eyes. The aura can include visual or auditory hallucinations (something imagined). It may include disruptions in smell (such as strange odors), taste or touch. Other symptoms include:  Numbness.  A "pins and needles" sensation.  Difficulty in recalling or speaking the correct word. These neurological events may last as long as 60 minutes. These symptoms will fade as the headache begins. Q: What is a trigger? A: Certain physical or environmental factors can lead to or "trigger" a migraine. These include:  Foods.  Hormonal changes.  Weather.  Stress. It is important to remember that triggers are different for everyone. To help prevent migraine attacks, you need to figure out which triggers affect you. Keep a headache diary. This is a good way to track triggers. The diary will help you talk to your healthcare professional about your condition. Q: Does  weather affect migraines? A: Bright sunshine, hot, humid conditions, and drastic changes in barometric pressure may lead to, or "trigger," a migraine attack in some people. But studies have shown that weather does not act as a trigger for everyone with migraines. Q: What is the link between migraine and hormones? A: Hormones start and regulate many of your body's functions. Hormones keep your body in balance within a constantly changing environment. The levels of hormones in your body are unbalanced at times. Examples are during menstruation, pregnancy, or menopause. That can lead to a migraine attack. In fact, about three quarters of all women with migraine report that their attacks are related to the menstrual cycle.  Q: Is there an increased risk of stroke for migraine sufferers? A: The likelihood of a migraine attack causing a stroke is very remote. That is not to say that migraine sufferers cannot have a stroke associated with their migraines. In persons under age 53, the most common associated factor for stroke is migraine headache. But over the course of a person's normal life span, the occurrence of migraine headache may actually be associated with a reduced risk of dying from cerebrovascular disease due to stroke.  Q: What are acute medications for migraine? A: Acute medications are used to treat the pain of the headache after it has started. Examples over-the-counter medications, NSAIDs, ergots, and triptans.  Q: What are the triptans? A: Triptans are the newest class of abortive medications. They are specifically targeted to treat migraine. Triptans are vasoconstrictors. They moderate some chemical reactions in the brain. The triptans work on receptors in your brain. Triptans help  to restore the balance of a neurotransmitter called serotonin. Fluctuations in levels of serotonin are thought to be a main cause of migraine.  °Q: Are over-the-counter medications for migraine effective? °A:  Over-the-counter, or "OTC," medications may be effective in relieving mild to moderate pain and associated symptoms of migraine. But you should see your caregiver before beginning any treatment regimen for migraine.  °Q: What are preventive medications for migraine? °A: Preventive medications for migraine are sometimes referred to as "prophylactic" treatments. They are used to reduce the frequency, severity, and length of migraine attacks. Examples of preventive medications include antiepileptic medications, antidepressants, beta-blockers, calcium channel blockers, and NSAIDs (nonsteroidal anti-inflammatory drugs). °Q: Why are anticonvulsants used to treat migraine? °A: During the past few years, there has been an increased interest in antiepileptic drugs for the prevention of migraine. They are sometimes referred to as "anticonvulsants". Both epilepsy and migraine may be caused by similar reactions in the brain.  °Q: Why are antidepressants used to treat migraine? °A: Antidepressants are typically used to treat people with depression. They may reduce migraine frequency by regulating chemical levels, such as serotonin, in the brain.  °Q: What alternative therapies are used to treat migraine? °A: The term "alternative therapies" is often used to describe treatments considered outside the scope of conventional Western medicine. Examples of alternative therapy include acupuncture, acupressure, and yoga. Another common alternative treatment is herbal therapy. Some herbs are believed to relieve headache pain. Always discuss alternative therapies with your caregiver before proceeding. Some herbal products contain arsenic and other toxins. °TENSION HEADACHES °Q: What is a tension-type headache? What causes it? How can I treat it? °A: Tension-type headaches occur randomly. They are often the result of temporary stress, anxiety, fatigue, or anger. Symptoms include soreness in your temples, a tightening band-like sensation  around your head (a "vice-like" ache). Symptoms can also include a pulling feeling, pressure sensations, and contracting head and neck muscles. The headache begins in your forehead, temples, or the back of your head and neck. Treatment for tension-type headache may include over-the-counter or prescription medications. Treatment may also include self-help techniques such as relaxation training and biofeedback. °CLUSTER HEADACHES °Q: What is a cluster headache? What causes it? How can I treat it? °A: Cluster headache gets its name because the attacks come in groups. The pain arrives with little, if any, warning. It is usually on one side of the head. A tearing or bloodshot eye and a runny nose on the same side of the headache may also accompany the pain. Cluster headaches are believed to be caused by chemical reactions in the brain. They have been described as the most severe and intense of any headache type. Treatment for cluster headache includes prescription medication and oxygen. °SINUS HEADACHES °Q: What is a sinus headache? What causes it? How can I treat it? °A: When a cavity in the bones of the face and skull (a sinus) becomes inflamed, the inflammation will cause localized pain. This condition is usually the result of an allergic reaction, a tumor, or an infection. If your headache is caused by a sinus blockage, such as an infection, you will probably have a fever. An x-ray will confirm a sinus blockage. Your caregiver's treatment might include antibiotics for the infection, as well as antihistamines or decongestants.  °REBOUND HEADACHES °Q: What is a rebound headache? What causes it? How can I treat it? °A: A pattern of taking acute headache medications too often can lead to a condition known as "rebound headache."   A pattern of taking too much headache medication includes taking it more than 2 days per week or in excessive amounts. That means more than the label or a caregiver advises. With rebound  headaches, your medications not only stop relieving pain, they actually begin to cause headaches. Doctors treat rebound headache by tapering the medication that is being overused. Sometimes your caregiver will gradually substitute a different type of treatment or medication. Stopping may be a challenge. Regularly overusing a medication increases the potential for serious side effects. Consult a caregiver if you regularly use headache medications more than 2 days per week or more than the label advises. ADDITIONAL QUESTIONS AND ANSWERS Q: What is biofeedback? A: Biofeedback is a self-help treatment. Biofeedback uses special equipment to monitor your body's involuntary physical responses. Biofeedback monitors:  Breathing.  Pulse.  Heart rate.  Temperature.  Muscle tension.  Brain activity. Biofeedback helps you refine and perfect your relaxation exercises. You learn to control the physical responses that are related to stress. Once the technique has been mastered, you do not need the equipment any more. Q: Are headaches hereditary? A: Four out of five (80%) of people that suffer report a family history of migraine. Scientists are not sure if this is genetic or a family predisposition. Despite the uncertainty, a child has a 50% chance of having migraine if one parent suffers. The child has a 75% chance if both parents suffer.  Q: Can children get headaches? A: By the time they reach high school, most young people have experienced some type of headache. Many safe and effective approaches or medications can prevent a headache from occurring or stop it after it has begun.  Q: What type of doctor should I see to diagnose and treat my headache? A: Start with your primary caregiver. Discuss his or her experience and approach to headaches. Discuss methods of classification, diagnosis, and treatment. Your caregiver may decide to recommend you to a headache specialist, depending upon your symptoms or other  physical conditions. Having diabetes, allergies, etc., may require a more comprehensive and inclusive approach to your headache. The National Head[employer] Foundation will provide, upon request, a list of [facility] physician members in your state. Document Released: 10/04/2003 Document Revised: 10/06/2011 Document Reviewed: 03/13/2008 [facility] Patient Information 2015 [place], [place]. This information is not intended to replace advice given to you by your health care provider. Make sure you discuss any questions you have with your health care provider.  High Blood Sugar High blood sugar (hyperglycemia) means that the level of sugar in your blood is higher than it should be. Signs of high blood sugar include:  Feeling thirsty.  Frequent peeing (urinating).  Feeling tired or sleepy.  Dry mouth.  Vision changes.  Feeling weak.  Feeling hungry but losing weight.  Numbness and tingling in your hands or feet.  Headache. When you ignore these signs, your blood sugar may keep going up. These problems may get worse, and other problems may begin. HOME CARE  Check your blood sugars as told by your doctor. Write down the numbers with the date and time.  Take the right amount of insulin or diabetes pills at the right time. Write down the dose with date and time.  Refill your insulin or diabetes pills before running out.  Watch what you eat. Follow your meal plan.  Drink liquids without sugar, such as water. Check with your doctor if you have kidney or heart disease.  Follow your doctor's orders for exercise. Exercise at  the same time of day.  Keep your doctor's appointments. GET HELP RIGHT AWAY IF:   You have trouble thinking or are confused.  You have fast breathing with fruity smelling breath.  You pass out (faint).  You have 2 to 3 days of high blood sugars and you do not know why.  You have chest pain.  You are feeling sick to your stomach (nauseous) or throwing up (vomiting).  You  have sudden vision changes. MAKE SURE YOU:   Understand these instructions.  Will watch your condition.  Will get help right away if you are not doing well or get worse. Document Released: 05/11/2009 Document Revised: 10/06/2011 Document Reviewed: 05/11/2009 [facility] Patient Information 2015 [place], [place]. This information is not intended to replace advice given to you by your health care provider. Make sure you discuss any questions you have with your health care provider.

## 2015-01-02 ENCOUNTER — Other Ambulatory Visit (HOSPITAL_COMMUNITY): Payer: Self-pay | Admitting: Pulmonary Disease

## 2015-01-02 DIAGNOSIS — G459 Transient cerebral ischemic attack, unspecified: Secondary | ICD-10-CM

## 2015-01-03 ENCOUNTER — Ambulatory Visit (HOSPITAL_COMMUNITY)
Admission: RE | Admit: 2015-01-03 | Discharge: 2015-01-03 | Disposition: A | Discharge status: Home / Self Care | Payer: Medicaid Other | Source: Ambulatory Visit | Attending: General Surgery | Admitting: General Surgery

## 2015-01-03 ENCOUNTER — Other Ambulatory Visit (HOSPITAL_COMMUNITY): Payer: Self-pay | Admitting: General Surgery

## 2015-01-03 DIAGNOSIS — Z1231 Encounter for screening mammogram for malignant neoplasm of breast: Secondary | ICD-10-CM

## 2015-01-03 DIAGNOSIS — N6320 Unspecified lump in the left breast, unspecified quadrant: Secondary | ICD-10-CM

## 2015-01-05 ENCOUNTER — Ambulatory Visit (HOSPITAL_COMMUNITY)
Admission: RE | Admit: 2015-01-05 | Discharge: 2015-01-05 | Disposition: A | Discharge status: Home / Self Care | Payer: Medicaid Other | Source: Ambulatory Visit | Attending: Pulmonary Disease | Admitting: Pulmonary Disease

## 2015-01-05 DIAGNOSIS — H538 Other visual disturbances: Secondary | ICD-10-CM | POA: Diagnosis not present

## 2015-01-05 DIAGNOSIS — R51 Headache: Secondary | ICD-10-CM | POA: Diagnosis not present

## 2015-01-05 DIAGNOSIS — R42 Dizziness and giddiness: Secondary | ICD-10-CM | POA: Insufficient documentation

## 2015-01-05 DIAGNOSIS — G459 Transient cerebral ischemic attack, unspecified: Secondary | ICD-10-CM

## 2015-01-06 DIAGNOSIS — T24212A Burn of second degree of left thigh, initial encounter: Secondary | ICD-10-CM | POA: Diagnosis not present

## 2015-01-06 DIAGNOSIS — J441 Chronic obstructive pulmonary disease with (acute) exacerbation: Secondary | ICD-10-CM | POA: Diagnosis not present

## 2015-01-06 DIAGNOSIS — Z79899 Other long term (current) drug therapy: Secondary | ICD-10-CM | POA: Insufficient documentation

## 2015-01-06 DIAGNOSIS — Z9981 Dependence on supplemental oxygen: Secondary | ICD-10-CM | POA: Diagnosis not present

## 2015-01-06 DIAGNOSIS — Y998 Other external cause status: Secondary | ICD-10-CM | POA: Diagnosis not present

## 2015-01-06 DIAGNOSIS — Z87891 Personal history of nicotine dependence: Secondary | ICD-10-CM | POA: Diagnosis not present

## 2015-01-06 DIAGNOSIS — M1990 Unspecified osteoarthritis, unspecified site: Secondary | ICD-10-CM | POA: Diagnosis not present

## 2015-01-06 DIAGNOSIS — G4733 Obstructive sleep apnea (adult) (pediatric): Secondary | ICD-10-CM | POA: Insufficient documentation

## 2015-01-06 DIAGNOSIS — R0602 Shortness of breath: Secondary | ICD-10-CM | POA: Diagnosis not present

## 2015-01-06 DIAGNOSIS — Y9289 Other specified places as the place of occurrence of the external cause: Secondary | ICD-10-CM | POA: Insufficient documentation

## 2015-01-06 DIAGNOSIS — T23172A Burn of first degree of left wrist, initial encounter: Secondary | ICD-10-CM | POA: Insufficient documentation

## 2015-01-06 DIAGNOSIS — E1165 Type 2 diabetes mellitus with hyperglycemia: Secondary | ICD-10-CM | POA: Diagnosis not present

## 2015-01-06 DIAGNOSIS — K219 Gastro-esophageal reflux disease without esophagitis: Secondary | ICD-10-CM | POA: Diagnosis not present

## 2015-01-06 DIAGNOSIS — Z862 Personal history of diseases of the blood and blood-forming organs and certain disorders involving the immune mechanism: Secondary | ICD-10-CM | POA: Diagnosis not present

## 2015-01-06 DIAGNOSIS — Y9389 Activity, other specified: Secondary | ICD-10-CM | POA: Insufficient documentation

## 2015-01-06 DIAGNOSIS — X19XXXA Contact with other heat and hot substances, initial encounter: Secondary | ICD-10-CM | POA: Diagnosis not present

## 2015-01-06 DIAGNOSIS — G2581 Restless legs syndrome: Secondary | ICD-10-CM | POA: Insufficient documentation

## 2015-01-06 DIAGNOSIS — R05 Cough: Secondary | ICD-10-CM | POA: Insufficient documentation

## 2015-01-06 DIAGNOSIS — Z791 Long term (current) use of non-steroidal anti-inflammatories (NSAID): Secondary | ICD-10-CM | POA: Diagnosis not present

## 2015-01-06 DIAGNOSIS — F319 Bipolar disorder, unspecified: Secondary | ICD-10-CM | POA: Diagnosis not present

## 2015-01-06 DIAGNOSIS — I10 Essential (primary) hypertension: Secondary | ICD-10-CM | POA: Diagnosis not present

## 2015-01-07 ENCOUNTER — Emergency Department (HOSPITAL_COMMUNITY): Payer: Medicaid Other

## 2015-01-07 ENCOUNTER — Encounter (HOSPITAL_COMMUNITY): Payer: Self-pay | Admitting: *Deleted

## 2015-01-07 ENCOUNTER — Emergency Department (HOSPITAL_COMMUNITY)
Admission: EM | Admit: 2015-01-07 | Discharge: 2015-01-07 | Disposition: A | Discharge status: Home / Self Care | Payer: Medicaid Other | Attending: Emergency Medicine | Admitting: Emergency Medicine

## 2015-01-07 DIAGNOSIS — R739 Hyperglycemia, unspecified: Secondary | ICD-10-CM

## 2015-01-07 DIAGNOSIS — T300 Burn of unspecified body region, unspecified degree: Secondary | ICD-10-CM

## 2015-01-07 LAB — COMPREHENSIVE METABOLIC PANEL
ALBUMIN: 3.4 g/dL — AB (ref 3.5–5.0)
ALT: 30 U/L (ref 14–54)
AST: 21 U/L (ref 15–41)
Alkaline Phosphatase: 82 U/L (ref 38–126)
Anion gap: 16 — ABNORMAL HIGH (ref 5–15)
BUN: 43 mg/dL — AB (ref 6–20)
CO2: 21 mmol/L — AB (ref 22–32)
Calcium: 8.8 mg/dL — ABNORMAL LOW (ref 8.9–10.3)
Chloride: 93 mmol/L — ABNORMAL LOW (ref 101–111)
Creatinine, Ser: 2.18 mg/dL — ABNORMAL HIGH (ref 0.44–1.00)
GFR calc Af Amer: 30 mL/min — ABNORMAL LOW (ref >60)
GFR calc non Af Amer: 26 mL/min — ABNORMAL LOW (ref >60)
GLUCOSE: 684 mg/dL — AB (ref 65–99)
Potassium: 4.7 mmol/L (ref 3.5–5.1)
Sodium: 130 mmol/L — ABNORMAL LOW (ref 135–145)
Total Bilirubin: 0.3 mg/dL (ref 0.3–1.2)
Total Protein: 6.8 g/dL (ref 6.5–8.1)

## 2015-01-07 LAB — BASIC METABOLIC PANEL
Anion gap: 11 (ref 5–15)
BUN: 39 mg/dL — AB (ref 6–20)
CO2: 21 mmol/L — AB (ref 22–32)
Calcium: 8.2 mg/dL — ABNORMAL LOW (ref 8.9–10.3)
Chloride: 99 mmol/L — ABNORMAL LOW (ref 101–111)
Creatinine, Ser: 1.98 mg/dL — ABNORMAL HIGH (ref 0.44–1.00)
GFR calc Af Amer: 34 mL/min — ABNORMAL LOW (ref >60)
GFR, EST NON AFRICAN AMERICAN: 29 mL/min — AB (ref >60)
Glucose, Bld: 485 mg/dL — ABNORMAL HIGH (ref 65–99)
POTASSIUM: 4.3 mmol/L (ref 3.5–5.1)
SODIUM: 131 mmol/L — AB (ref 135–145)

## 2015-01-07 LAB — CBC WITH DIFFERENTIAL/PLATELET
BASOS ABS: 0.1 10*3/uL (ref 0.0–0.1)
BASOS PCT: 1 % (ref 0–1)
EOS ABS: 0.2 10*3/uL (ref 0.0–0.7)
Eosinophils Relative: 3 % (ref 0–5)
HEMATOCRIT: 36.4 % (ref 36.0–46.0)
Hemoglobin: 12.5 g/dL (ref 12.0–15.0)
Lymphocytes Relative: 57 % — ABNORMAL HIGH (ref 12–46)
Lymphs Abs: 3.8 10*3/uL (ref 0.7–4.0)
MCH: 29.8 pg (ref 26.0–34.0)
MCHC: 34.3 g/dL (ref 30.0–36.0)
MCV: 86.7 fL (ref 78.0–100.0)
MONO ABS: 0.3 10*3/uL (ref 0.1–1.0)
MONOS PCT: 4 % (ref 3–12)
NEUTROS ABS: 2.4 10*3/uL (ref 1.7–7.7)
NEUTROS PCT: 35 % — AB (ref 43–77)
Platelets: 211 10*3/uL (ref 150–400)
RBC: 4.2 MIL/uL (ref 3.87–5.11)
RDW: 13.6 % (ref 11.5–15.5)
WBC: 6.8 10*3/uL (ref 4.0–10.5)

## 2015-01-07 LAB — CBG MONITORING, ED
GLUCOSE-CAPILLARY: 351 mg/dL — AB (ref 65–99)
Glucose-Capillary: 484 mg/dL — ABNORMAL HIGH (ref 65–99)
Glucose-Capillary: >600 mg/dL (ref 65–99)

## 2015-01-07 MED ORDER — HYDROCODONE-ACETAMINOPHEN 5-325 MG PO TABS
1.0000 | ORAL_TABLET | Freq: Once | ORAL | Status: AC
  Administered 2015-01-07: 1 via ORAL
  Filled 2015-01-07: qty 1

## 2015-01-07 MED ORDER — SODIUM CHLORIDE 0.9 % IV BOLUS (SEPSIS)
1000.0000 mL | Freq: Once | INTRAVENOUS | Status: AC
  Administered 2015-01-07: 1000 mL via INTRAVENOUS

## 2015-01-07 MED ORDER — INSULIN ASPART 100 UNIT/ML ~~LOC~~ SOLN: 10.0000 [IU] | Freq: Once | SUBCUTANEOUS | Status: DC

## 2015-01-07 MED ORDER — BACITRACIN ZINC 500 UNIT/GM EX OINT
TOPICAL_OINTMENT | Freq: Once | CUTANEOUS | Status: AC
  Administered 2015-01-07: 1 via TOPICAL
  Filled 2015-01-07: qty 0.9

## 2015-01-07 MED ORDER — INSULIN ASPART 100 UNIT/ML ~~LOC~~ SOLN
10.0000 [IU] | Freq: Once | SUBCUTANEOUS | Status: AC
  Administered 2015-01-07: 10 [IU] via INTRAVENOUS
  Filled 2015-01-07: qty 1

## 2015-01-07 NOTE — ED Notes (Signed)
Patient verbalizes understanding understanding of discharge instructions home care and follow up care. Patient ambulatory out of department at this time with family.

## 2015-01-07 NOTE — ED Notes (Signed)
CRITICAL VALUE ALERT  Critical value received:  Glucose 684  Date of notification:  01/07/15  Time of notification:  0120  Critical value read back:Yes.    Nurse who received alert:  Derek Mound, RN  MD notified (1st page):  Horton  Time of first page:  0122  MD notified (2nd page):  Time of second page:  Responding MD:  Horton  Time MD responded:  [phone]
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
## 2015-01-07 NOTE — ED Notes (Signed)
Pt c/o "not feeling right" and states her cbg at home was in the 590's, the meter here reads HI
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
## 2015-01-23 ENCOUNTER — Ambulatory Visit (HOSPITAL_COMMUNITY)
Admission: RE | Admit: 2015-01-23 | Discharge: 2015-01-23 | Disposition: A | Discharge status: Home / Self Care | Payer: Medicaid Other | Source: Ambulatory Visit | Attending: General Surgery | Admitting: General Surgery

## 2015-01-23 DIAGNOSIS — N63 Unspecified lump in breast: Secondary | ICD-10-CM | POA: Diagnosis not present

## 2015-01-23 DIAGNOSIS — N6320 Unspecified lump in the left breast, unspecified quadrant: Secondary | ICD-10-CM

## 2015-02-14 ENCOUNTER — Emergency Department (HOSPITAL_COMMUNITY): Payer: Medicaid Other

## 2015-02-14 ENCOUNTER — Emergency Department (HOSPITAL_COMMUNITY)
Admission: EM | Admit: 2015-02-14 | Discharge: 2015-02-14 | Disposition: A | Discharge status: Home / Self Care | Payer: Medicaid Other | Attending: Emergency Medicine | Admitting: Emergency Medicine

## 2015-02-14 ENCOUNTER — Encounter (HOSPITAL_COMMUNITY): Payer: Self-pay | Admitting: Emergency Medicine

## 2015-02-14 DIAGNOSIS — E669 Obesity, unspecified: Secondary | ICD-10-CM | POA: Insufficient documentation

## 2015-02-14 DIAGNOSIS — G4733 Obstructive sleep apnea (adult) (pediatric): Secondary | ICD-10-CM | POA: Diagnosis not present

## 2015-02-14 DIAGNOSIS — I10 Essential (primary) hypertension: Secondary | ICD-10-CM | POA: Diagnosis not present

## 2015-02-14 DIAGNOSIS — Z791 Long term (current) use of non-steroidal anti-inflammatories (NSAID): Secondary | ICD-10-CM | POA: Diagnosis not present

## 2015-02-14 DIAGNOSIS — R059 Cough, unspecified: Secondary | ICD-10-CM

## 2015-02-14 DIAGNOSIS — Z79899 Other long term (current) drug therapy: Secondary | ICD-10-CM | POA: Insufficient documentation

## 2015-02-14 DIAGNOSIS — M1990 Unspecified osteoarthritis, unspecified site: Secondary | ICD-10-CM | POA: Insufficient documentation

## 2015-02-14 DIAGNOSIS — R05 Cough: Secondary | ICD-10-CM | POA: Insufficient documentation

## 2015-02-14 DIAGNOSIS — J449 Chronic obstructive pulmonary disease, unspecified: Secondary | ICD-10-CM | POA: Insufficient documentation

## 2015-02-14 DIAGNOSIS — G2581 Restless legs syndrome: Secondary | ICD-10-CM | POA: Insufficient documentation

## 2015-02-14 DIAGNOSIS — E119 Type 2 diabetes mellitus without complications: Secondary | ICD-10-CM | POA: Diagnosis not present

## 2015-02-14 DIAGNOSIS — Z862 Personal history of diseases of the blood and blood-forming organs and certain disorders involving the immune mechanism: Secondary | ICD-10-CM | POA: Insufficient documentation

## 2015-02-14 DIAGNOSIS — K219 Gastro-esophageal reflux disease without esophagitis: Secondary | ICD-10-CM | POA: Insufficient documentation

## 2015-02-14 DIAGNOSIS — Z9981 Dependence on supplemental oxygen: Secondary | ICD-10-CM | POA: Insufficient documentation

## 2015-02-14 DIAGNOSIS — F319 Bipolar disorder, unspecified: Secondary | ICD-10-CM | POA: Diagnosis not present

## 2015-02-14 DIAGNOSIS — Z87891 Personal history of nicotine dependence: Secondary | ICD-10-CM | POA: Insufficient documentation

## 2015-02-14 MED ORDER — HYDROCODONE-ACETAMINOPHEN 5-325 MG PO TABS
1.0000 | ORAL_TABLET | Freq: Once | ORAL | Status: AC
  Administered 2015-02-14: 1 via ORAL
  Filled 2015-02-14: qty 1

## 2015-02-14 MED ORDER — IPRATROPIUM-ALBUTEROL 0.5-2.5 (3) MG/3ML IN SOLN
3.0000 mL | Freq: Once | RESPIRATORY_TRACT | Status: AC
  Administered 2015-02-14: 3 mL via RESPIRATORY_TRACT
  Filled 2015-02-14: qty 3

## 2015-02-14 MED ORDER — HYDROCODONE-ACETAMINOPHEN 5-325 MG PO TABS: 1.0000 | ORAL_TABLET | ORAL | Status: DC | PRN

## 2015-02-14 MED ORDER — PREDNISONE 50 MG PO TABS
60.0000 mg | ORAL_TABLET | Freq: Once | ORAL | Status: AC
  Administered 2015-02-14: 60 mg via ORAL
  Filled 2015-02-14 (×2): qty 1

## 2015-02-14 MED ORDER — PREDNISONE 20 MG PO TABS: 40.0000 mg | ORAL_TABLET | Freq: Every day | ORAL | Status: DC

## 2015-02-14 NOTE — ED Notes (Signed)
Pt c/o cough x 5 days. Pt states she coughs until she vomits.

## 2015-02-14 NOTE — ED Provider Notes (Signed)
CSN: [id number]     Arrival date & time 02/14/15  0130 History   First MD Initiated Contact with Patient 02/14/15 [phone]     Chief Complaint  Patient presents with  . Cough     (Consider location/radiation/quality/duration/timing/severity/associated sxs/prior Treatment) Patient is a 49 y.o. female presenting with cough. The history is provided by the patient.  Cough She has had a nonproductive cough for the last 5 days. Cough is associated with post tussive emesis. She denies fever but has had chills and sweats. There is associated clear rhinorrhea. She is also had diarrhea with 4-5 loose bowel movements a day. She denies any change in chronic body aches. She has used her home albuterol nebulizer without any benefit. There is mild associated dyspnea. She denies chest pain.  Past Medical History  Diagnosis Date  . Diabetes mellitus   . Hypertension   . RLS (restless legs syndrome)   . Acid reflux   . Bipolar 1 disorder   . Depression   . OSA (obstructive sleep apnea)     cpap  . Anemia   . Arthritis   . Degenerative disc disease, thoracic   . Shortness of breath   . Obesity (BMI 30-39.9)   . COPD (chronic obstructive pulmonary disease) 2014    bronchitis   Past Surgical History  Procedure Laterality Date  . Tubal ligation    . Endometrial ablation    . Colonoscopy  06/06/2010    [id number] bleeding secondary to internal hemorrhoids but incomplete evaluation secondary to poor right colon prep/small rectal and sigmoid colon polyps (hyperplastic). PROPOFOL  . Esophagogastroduodenoscopy  06/06/2010    [id number] erythema and edema of body of stomach, with sessile polypoid lesions. bx benign. no h.pylori  . Colonoscopy  May 2013    Dr. Gilliam/[facility]: 5 mm a descending colon polyp, hyperplastic. Adequate bowel prep.  . Flexible sigmoidoscopy N/A 05/17/2013    Dr. Oneida Alar: moderate sized internal hemorrhoids  . Esophagogastroduodenoscopy (egd) with propofol N/A 05/17/2013    Dr.  Oneida Alar: normal esophagus, moderate nodular gastritis, negative path, empiric Savary dilation  . Savory dilation N/A 05/17/2013    Procedure: SAVORY DILATION;  Surgeon: [name], MD;  Location: [facility];  Service: Endoscopy;  Laterality: N/A;  14/15/16  . Hemorrhoid banding N/A 05/17/2013    Procedure: HEMORRHOID BANDING;  Surgeon: [name], MD;  Location: [facility];  Service: Endoscopy;  Laterality: N/A;  2 bands placed  . Esophageal biopsy N/A 05/17/2013    Procedure: BIOPSY;  Surgeon: [name], MD;  Location: [facility];  Service: Endoscopy;  Laterality: N/A;  . Irrigation and debridement abscess Right 06/01/2013    Procedure: INCISION AND DRAINAGE AND DEBRIDEMENT ABSCESS RIGHT BREAST;  Surgeon: [name], MD;  Location: [facility];  Service: General;  Laterality: Right;  . Incision and drainage abscess Left 10/11/2013    Procedure: INCISION AND DRAINAGE AND DEBRIDEMENT LEFT BREAST  ABSCESS;  Surgeon: [name], MD;  Location: [facility];  Service: General;  Laterality: Left;  . Cholecystectomy N/A 04/27/2014    Procedure: LAPAROSCOPIC CHOLECYSTECTOMY;  Surgeon: [name], MD;  Location: [facility];  Service: General;  Laterality: N/A;   Family History  Problem Relation Age of Onset  . Adopted: Yes  . Family history unknown: Yes   History  Substance Use Topics  . Smoking status: Former Smoker -- 1.50 packs/day for 25 years    Types: Cigarettes    Quit date: 10/23/2007  . Smokeless tobacco:  Former Systems developer     Comment: Quit x 6 years ago  . Alcohol Use: No   OB History    Gravida Para Term Preterm AB TAB SAB Ectopic Multiple Living   2 2 1 1      2      Review of Systems  Respiratory: Positive for cough.   All other systems reviewed and are negative.     Allergies  Codeine  Home Medications   Prior to Admission medications   Medication Sig Start Date End Date Taking? Authorizing Provider  albuterol (PROVENTIL) (2.5 MG/3ML) 0.083% nebulizer solution Take  2.5 mg by nebulization 4 (four) times daily.    Yes Historical Provider, MD  ALPRAZolam Duanne Moron) 1 MG tablet Take 1 mg by mouth 4 (four) times daily.     Yes Historical Provider, MD  amLODipine (NORVASC) 10 MG tablet Take 10 mg by mouth daily.   Yes Historical Provider, MD  calcium carbonate (TUMS - DOSED IN MG ELEMENTAL CALCIUM) 500 MG chewable tablet Chew 2 tablets by mouth daily as needed for indigestion or heartburn.   Yes Historical Provider, MD  celecoxib (CELEBREX) 100 MG capsule Take 1 capsule (100 mg total) by mouth 2 (two) times daily. 07/23/14  Yes Lily Kocher, PA-C  diclofenac (VOLTAREN) 75 MG EC tablet Take 1 tablet (75 mg total) by mouth 2 (two) times daily with a meal. 10/17/14  Yes Carole Civil, MD  dicyclomine (BENTYL) 10 MG capsule Take 1 capsule (10 mg total) by mouth 4 (four) times daily -  before meals and at bedtime. 02/09/14  Yes Orvil Feil, NP  DULoxetine (CYMBALTA) 60 MG capsule Take 60 mg by mouth daily.   Yes Historical Provider, MD  gabapentin (NEURONTIN) 300 MG capsule Take 300 mg by mouth 3 (three) times daily.   Yes Historical Provider, MD  HYDROcodone-acetaminophen (NORCO) 10-325 MG per tablet Take 1 tablet by mouth every 6 (six) hours as needed for pain.   Yes Historical Provider, MD  lisinopril-hydrochlorothiazide (PRINZIDE,ZESTORETIC) 20-12.5 MG per tablet Take 1 tablet by mouth 2 (two) times daily.    Yes Historical Provider, MD  meloxicam (MOBIC) 15 MG tablet Take 15 mg by mouth daily.   Yes Historical Provider, MD  metFORMIN (GLUCOPHAGE) 1000 MG tablet Take 1,000 mg by mouth 2 (two) times daily with a meal.   Yes Historical Provider, MD  omeprazole (PRILOSEC) 20 MG capsule Take 20 mg by mouth 2 (two) times daily as needed (acid reflux).    Yes Historical Provider, MD  rOPINIRole (REQUIP) 1 MG tablet Take 1 mg by mouth at bedtime.    Yes Historical Provider, MD  simvastatin (ZOCOR) 40 MG tablet Take 40 mg by mouth every evening.   Yes Historical Provider, MD   clindamycin (CLEOCIN) 300 MG capsule Take 1 capsule (300 mg total) by mouth 4 (four) times daily. X 7 days 12/19/14   [name], MD   BP 153/97 mmHg  Pulse 96  Temp(Src) 98.3 F (36.8 C)  Resp 17  Ht 5\' 8"  (1.727 m)  Wt 342 lb (155.13 kg)  BMI 52.01 kg/m2  SpO2 94% Physical Exam  Nursing note and vitals reviewed.  49 year old female, resting comfortably and in no acute distress. Vital signs are significant for hypertension. Oxygen saturation is 94%, which is normal. Head is normocephalic and atraumatic. PERRLA, EOMI. Oropharynx is clear. Neck is nontender and supple without adenopathy or JVD. Back is nontender and there is no CVA tenderness. Lungs have a  slightly prolonged exhalation phase without overt rales, wheezes, or rhonchi. Chest is nontender. Heart has regular rate and rhythm without murmur. Abdomen is soft, flat, nontender without masses or hepatosplenomegaly and peristalsis is normoactive. Extremities have no cyanosis or edema, full range of motion is present. Skin is warm and dry without rash. Neurologic: Mental status is normal, cranial nerves are intact, there are no motor or sensory deficits.  ED Course  Procedures (including critical care time) Imaging Review Dg Chest 2 View  02/14/2015   CLINICAL DATA:  49 year old female with cough  EXAM: CHEST  2 VIEW  COMPARISON:  Chest radiograph dated 01/07/2015  FINDINGS: The heart size and mediastinal contours are within normal limits. Both lungs are clear. The visualized skeletal structures are unremarkable.  IMPRESSION: No active cardiopulmonary disease.   Electronically Signed   By: Anner Crete M.D.   On: 02/14/2015 03:08   Images viewed by me.  MDM   Final diagnoses:  Cough    Cough which is most consistent with viral bronchitis. Chest x-ray will be obtained to rule out pneumonia. She will be given albuterol with ipratropium nebulizer treatment.  Chest x-ray shows no evidence of pneumonia. There is  slight relief of symptoms with initial nebulizer treatment but no further improvement with a second nebulizer treatment. She is given initial dose of prednisone and is discharged with prescription for prednisone as well as hydrocodone-acetaminophen for cough suppression. Follow-up with her PCP in 2 days for recheck. Advised to monitor her blood sugar carefully because of being started on prednisone.  Delora Fuel, MD [phone]

## 2015-02-14 NOTE — ED Notes (Signed)
Gave patient diet coke as requested.

## 2015-02-14 NOTE — Discharge Instructions (Signed)
Continue using your albuterol nebulizer. Monitor your blood sugar, because prednisone can make it go higher.  Cough, Adult  A cough is a reflex that helps clear your throat and airways. It can help heal the body or may be a reaction to an irritated airway. A cough may only last 2 or 3 weeks (acute) or may last more than 8 weeks (chronic).  CAUSES Acute cough:  Viral or bacterial infections. Chronic cough:  Infections.  Allergies.  Asthma.  Post-nasal drip.  Smoking.  Heartburn or acid reflux.  Some medicines.  Chronic lung problems (COPD).  Cancer. SYMPTOMS   Cough.  Fever.  Chest pain.  Increased breathing rate.  High-pitched whistling sound when breathing (wheezing).  Colored mucus that you cough up (sputum). TREATMENT   A bacterial cough may be treated with antibiotic medicine.  A viral cough must run its course and will not respond to antibiotics.  Your caregiver may recommend other treatments if you have a chronic cough. HOME CARE INSTRUCTIONS   Only take over-the-counter or prescription medicines for pain, discomfort, or fever as directed by your caregiver. Use cough suppressants only as directed by your caregiver.  Use a cold steam vaporizer or humidifier in your bedroom or home to help loosen secretions.  Sleep in a semi-upright position if your cough is worse at night.  Rest as needed.  Stop smoking if you smoke. SEEK IMMEDIATE MEDICAL CARE IF:   You have pus in your sputum.  Your cough starts to worsen.  You cannot control your cough with suppressants and are losing sleep.  You begin coughing up blood.  You have difficulty breathing.  You develop pain which is getting worse or is uncontrolled with medicine.  You have a fever. MAKE SURE YOU:   Understand these instructions.  Will watch your condition.  Will get help right away if you are not doing well or get worse. Document Released: 01/10/2011 Document Revised: 10/06/2011  Document Reviewed: 01/10/2011 [facility] Patient Information 2015 [place], [place]. This information is not intended to replace advice given to you by your health care provider. Make sure you discuss any questions you have with your health care provider.  Prednisone tablets What is this medicine? PREDNISONE (PRED ni sone) is a corticosteroid. It is commonly used to treat inflammation of the skin, joints, lungs, and other organs. Common conditions treated include asthma, allergies, and arthritis. It is also used for other conditions, such as blood disorders and diseases of the adrenal glands. This medicine may be used for other purposes; ask your health care provider or pharmacist if you have questions. COMMON BRAND NAME(S): Deltasone, Predone, Sterapred, Sterapred DS What should I tell my health care provider before I take this medicine? They need to know if you have any of these conditions: -Cushing's syndrome -diabetes -glaucoma -heart disease -high blood pressure -infection (especially a virus infection such as chickenpox, cold sores, or herpes) -kidney disease -liver disease -mental illness -myasthenia gravis -osteoporosis -seizures -stomach or intestine problems -thyroid disease -an unusual or allergic reaction to lactose, prednisone, other medicines, foods, dyes, or preservatives -pregnant or trying to get pregnant -breast-feeding How should I use this medicine? Take this medicine by mouth with a glass of water. Follow the directions on the prescription label. Take this medicine with food. If you are taking this medicine once a day, take it in the morning. Do not take more medicine than you are told to take. Do not suddenly stop taking your medicine because you may develop a  severe reaction. Your doctor will tell you how much medicine to take. If your doctor wants you to stop the medicine, the dose may be slowly lowered over time to avoid any side effects. Talk to your pediatrician  regarding the use of this medicine in children. Special care may be needed. Overdosage: If you think you have taken too much of this medicine contact a poison control center or emergency room at once. NOTE: This medicine is only for you. Do not share this medicine with others. What if I miss a dose? If you miss a dose, take it as soon as you can. If it is almost time for your next dose, talk to your doctor or health care professional. You may need to miss a dose or take an extra dose. Do not take double or extra doses without advice. What may interact with this medicine? Do not take this medicine with any of the following medications: -metyrapone -mifepristone This medicine may also interact with the following medications: -aminoglutethimide -amphotericin B -aspirin and aspirin-like medicines -barbiturates -certain medicines for diabetes, like glipizide or glyburide -cholestyramine -cholinesterase inhibitors -cyclosporine -digoxin -diuretics -ephedrine -female hormones, like estrogens and birth control pills -isoniazid -ketoconazole -NSAIDS, medicines for pain and inflammation, like ibuprofen or naproxen -phenytoin -rifampin -toxoids -vaccines -warfarin This list may not describe all possible interactions. Give your health care provider a list of all the medicines, herbs, non-prescription drugs, or dietary supplements you use. Also tell them if you smoke, drink alcohol, or use illegal drugs. Some items may interact with your medicine. What should I watch for while using this medicine? Visit your doctor or health care professional for regular checks on your progress. If you are taking this medicine over a prolonged period, carry an identification card with your name and address, the type and dose of your medicine, and your doctor's name and address. This medicine may increase your risk of getting an infection. Tell your doctor or health care professional if you are around anyone with  measles or chickenpox, or if you develop sores or blisters that do not heal properly. If you are going to have surgery, tell your doctor or health care professional that you have taken this medicine within the last twelve months. Ask your doctor or health care professional about your diet. You may need to lower the amount of salt you eat. This medicine may affect blood sugar levels. If you have diabetes, check with your doctor or health care professional before you change your diet or the dose of your diabetic medicine. What side effects may I notice from receiving this medicine? Side effects that you should report to your doctor or health care professional as soon as possible: -allergic reactions like skin rash, itching or hives, swelling of the face, lips, or tongue -changes in emotions or moods -changes in vision -depressed mood -eye pain -fever or chills, cough, sore throat, pain or difficulty passing urine -increased thirst -swelling of ankles, feet Side effects that usually do not require medical attention (report to your doctor or health care professional if they continue or are bothersome): -confusion, excitement, restlessness -headache -nausea, vomiting -skin problems, acne, thin and shiny skin -trouble sleeping -weight gain This list may not describe all possible side effects. Call your doctor for medical advice about side effects. You may report side effects to FDA at 1-800-FDA-1088. Where should I keep my medicine? Keep out of the reach of children. Store at room temperature between 15 and 30 degrees C (59 and 86  degrees F). Protect from light. Keep container tightly closed. Throw away any unused medicine after the expiration date. NOTE: This sheet is a summary. It may not cover all possible information. If you have questions about this medicine, talk to your doctor, pharmacist, or health care provider.  2015, [employer]/Gold Standard. (2011-02-27 10:57:14)  Acetaminophen;  Hydrocodone tablets or capsules What is this medicine? ACETAMINOPHEN; HYDROCODONE (a set a MEE noe fen; hye droe KOE done) is a pain reliever. It is used to treat mild to moderate pain. This medicine may be used for other purposes; ask your health care provider or pharmacist if you have questions. COMMON BRAND NAME(S): Anexsia, Bancap HC, Ceta-Plus, Co-Gesic, Comfortpak, Dolagesic, [employer], DuoCet, Hydrocet, Hydrogesic, [place], Lorcet HD, Lorcet Plus, Lortab, Margesic H, Maxidone, [place], Polygesic, [place], [place], [place], Vicodin, Vicodin ES, Vicodin HP, Charlane Ferretti What should I tell my health care provider before I take this medicine? They need to know if you have any of these conditions: -brain tumor -Crohn's disease, inflammatory bowel disease, or ulcerative colitis -drug abuse or addiction -head injury -heart or circulation problems -if you often drink alcohol -kidney disease or problems going to the bathroom -liver disease -lung disease, asthma, or breathing problems -an unusual or allergic reaction to acetaminophen, hydrocodone, other opioid analgesics, other medicines, foods, dyes, or preservatives -pregnant or trying to get pregnant -breast-feeding How should I use this medicine? Take this medicine by mouth. Swallow it with a full glass of water. Follow the directions on the prescription label. If the medicine upsets your stomach, take the medicine with food or milk. Do not take more than you are told to take. Talk to your pediatrician regarding the use of this medicine in children. This medicine is not approved for use in children. Overdosage: If you think you have taken too much of this medicine contact a poison control center or emergency room at once. NOTE: This medicine is only for you. Do not share this medicine with others. What if I miss a dose? If you miss a dose, take it as soon as you can. If it is almost time for your next dose, take only that dose. Do not  take double or extra doses. What may interact with this medicine? -alcohol -antihistamines -isoniazid -medicines for depression, anxiety, or psychotic disturbances -medicines for sleep -muscle relaxants -naltrexone -narcotic medicines (opiates) for pain -phenobarbital -ritonavir -tramadol This list may not describe all possible interactions. Give your health care provider a list of all the medicines, herbs, non-prescription drugs, or dietary supplements you use. Also tell them if you smoke, drink alcohol, or use illegal drugs. Some items may interact with your medicine. What should I watch for while using this medicine? Tell your doctor or health care professional if your pain does not go away, if it gets worse, or if you have new or a different type of pain. You may develop tolerance to the medicine. Tolerance means that you will need a higher dose of the medicine for pain relief. Tolerance is normal and is expected if you take the medicine for a long time. Do not suddenly stop taking your medicine because you may develop a severe reaction. Your body becomes used to the medicine. This does NOT mean you are addicted. Addiction is a behavior related to getting and using a drug for a non-medical reason. If you have pain, you have a medical reason to take pain medicine. Your doctor will tell you how much medicine to take. If your doctor wants you  to stop the medicine, the dose will be slowly lowered over time to avoid any side effects. You may get drowsy or dizzy when you first start taking the medicine or change doses. Do not drive, use machinery, or do anything that may be dangerous until you know how the medicine affects you. Stand or sit up slowly. There are different types of narcotic medicines (opiates) for pain. If you take more than one type at the same time, you may have more side effects. Give your health care provider a list of all medicines you use. Your doctor will tell you how much  medicine to take. Do not take more medicine than directed. Call emergency for help if you have problems breathing. The medicine will cause constipation. Try to have a bowel movement at least every 2 to 3 days. If you do not have a bowel movement for 3 days, call your doctor or health care professional. Too much acetaminophen can be very dangerous. Do not take Tylenol (acetaminophen) or medicines that contain acetaminophen with this medicine. Many non-prescription medicines contain acetaminophen. Always read the labels carefully. What side effects may I notice from receiving this medicine? Side effects that you should report to your doctor or health care professional as soon as possible: -allergic reactions like skin rash, itching or hives, swelling of the face, lips, or tongue -breathing problems -confusion -feeling faint or lightheaded, falls -stomach pain -yellowing of the eyes or skin Side effects that usually do not require medical attention (report to your doctor or health care professional if they continue or are bothersome): -nausea, vomiting -stomach upset This list may not describe all possible side effects. Call your doctor for medical advice about side effects. You may report side effects to FDA at 1-800-FDA-1088. Where should I keep my medicine? Keep out of the reach of children. This medicine can be abused. Keep your medicine in a safe place to protect it from theft. Do not share this medicine with anyone. Selling or giving away this medicine is dangerous and against the law. Store at room temperature between 15 and 30 degrees C (59 and 86 degrees F). Protect from light. Keep container tightly closed. Throw away any unused medicine after the expiration date. Discard unused medicine and used packaging carefully. Pets and children can be harmed if they find used or lost packages. NOTE: This sheet is a summary. It may not cover all possible information. If you have questions about this  medicine, talk to your doctor, pharmacist, or health care provider.  2015, [employer]/Gold Standard. (2013-03-07 13:15:56)

## 2015-02-15 ENCOUNTER — Encounter: Payer: Self-pay | Admitting: Gastroenterology

## 2015-02-22 ENCOUNTER — Other Ambulatory Visit (HOSPITAL_COMMUNITY): Payer: Self-pay | Admitting: Pulmonary Disease

## 2015-02-22 DIAGNOSIS — R0602 Shortness of breath: Secondary | ICD-10-CM

## 2015-02-26 ENCOUNTER — Ambulatory Visit (HOSPITAL_COMMUNITY)
Admission: RE | Admit: 2015-02-26 | Discharge: 2015-02-26 | Disposition: A | Discharge status: Home / Self Care | Payer: Medicaid Other | Source: Ambulatory Visit | Attending: Pulmonary Disease | Admitting: Pulmonary Disease

## 2015-02-26 DIAGNOSIS — E041 Nontoxic single thyroid nodule: Secondary | ICD-10-CM | POA: Diagnosis not present

## 2015-02-26 DIAGNOSIS — I2510 Atherosclerotic heart disease of native coronary artery without angina pectoris: Secondary | ICD-10-CM | POA: Insufficient documentation

## 2015-02-26 DIAGNOSIS — R05 Cough: Secondary | ICD-10-CM | POA: Diagnosis not present

## 2015-02-26 DIAGNOSIS — R0602 Shortness of breath: Secondary | ICD-10-CM | POA: Diagnosis not present

## 2015-03-14 ENCOUNTER — Ambulatory Visit: Payer: Medicaid Other | Admitting: Gastroenterology

## 2015-03-17 ENCOUNTER — Emergency Department (HOSPITAL_COMMUNITY)
Admission: EM | Admit: 2015-03-17 | Discharge: 2015-03-18 | Disposition: A | Discharge status: Home / Self Care | Payer: Medicaid Other | Attending: Emergency Medicine | Admitting: Emergency Medicine

## 2015-03-17 ENCOUNTER — Encounter (HOSPITAL_COMMUNITY): Payer: Self-pay | Admitting: *Deleted

## 2015-03-17 DIAGNOSIS — E669 Obesity, unspecified: Secondary | ICD-10-CM | POA: Diagnosis not present

## 2015-03-17 DIAGNOSIS — I10 Essential (primary) hypertension: Secondary | ICD-10-CM | POA: Insufficient documentation

## 2015-03-17 DIAGNOSIS — G4733 Obstructive sleep apnea (adult) (pediatric): Secondary | ICD-10-CM | POA: Insufficient documentation

## 2015-03-17 DIAGNOSIS — M1990 Unspecified osteoarthritis, unspecified site: Secondary | ICD-10-CM | POA: Diagnosis not present

## 2015-03-17 DIAGNOSIS — E1165 Type 2 diabetes mellitus with hyperglycemia: Secondary | ICD-10-CM | POA: Insufficient documentation

## 2015-03-17 DIAGNOSIS — Z79899 Other long term (current) drug therapy: Secondary | ICD-10-CM | POA: Diagnosis not present

## 2015-03-17 DIAGNOSIS — Z87891 Personal history of nicotine dependence: Secondary | ICD-10-CM | POA: Diagnosis not present

## 2015-03-17 DIAGNOSIS — R109 Unspecified abdominal pain: Secondary | ICD-10-CM | POA: Diagnosis present

## 2015-03-17 DIAGNOSIS — B3741 Candidal cystitis and urethritis: Secondary | ICD-10-CM | POA: Diagnosis not present

## 2015-03-17 DIAGNOSIS — R739 Hyperglycemia, unspecified: Secondary | ICD-10-CM

## 2015-03-17 DIAGNOSIS — F319 Bipolar disorder, unspecified: Secondary | ICD-10-CM | POA: Diagnosis not present

## 2015-03-17 DIAGNOSIS — K219 Gastro-esophageal reflux disease without esophagitis: Secondary | ICD-10-CM | POA: Diagnosis not present

## 2015-03-17 DIAGNOSIS — Z791 Long term (current) use of non-steroidal anti-inflammatories (NSAID): Secondary | ICD-10-CM | POA: Diagnosis not present

## 2015-03-17 DIAGNOSIS — Z9114 Patient's other noncompliance with medication regimen: Secondary | ICD-10-CM

## 2015-03-17 DIAGNOSIS — Z794 Long term (current) use of insulin: Secondary | ICD-10-CM | POA: Diagnosis not present

## 2015-03-17 DIAGNOSIS — Z9119 Patient's noncompliance with other medical treatment and regimen: Secondary | ICD-10-CM | POA: Insufficient documentation

## 2015-03-17 DIAGNOSIS — Z9981 Dependence on supplemental oxygen: Secondary | ICD-10-CM | POA: Insufficient documentation

## 2015-03-17 DIAGNOSIS — Z862 Personal history of diseases of the blood and blood-forming organs and certain disorders involving the immune mechanism: Secondary | ICD-10-CM | POA: Insufficient documentation

## 2015-03-17 DIAGNOSIS — R197 Diarrhea, unspecified: Secondary | ICD-10-CM | POA: Insufficient documentation

## 2015-03-17 DIAGNOSIS — J449 Chronic obstructive pulmonary disease, unspecified: Secondary | ICD-10-CM | POA: Insufficient documentation

## 2015-03-17 LAB — COMPREHENSIVE METABOLIC PANEL
ALK PHOS: 100 U/L (ref 38–126)
ALT: 27 U/L (ref 14–54)
ANION GAP: 10 (ref 5–15)
AST: 18 U/L (ref 15–41)
Albumin: 3 g/dL — ABNORMAL LOW (ref 3.5–5.0)
BILIRUBIN TOTAL: 0.1 mg/dL — AB (ref 0.3–1.2)
BUN: 27 mg/dL — ABNORMAL HIGH (ref 6–20)
CO2: 21 mmol/L — ABNORMAL LOW (ref 22–32)
Calcium: 8.8 mg/dL — ABNORMAL LOW (ref 8.9–10.3)
Chloride: 98 mmol/L — ABNORMAL LOW (ref 101–111)
Creatinine, Ser: 1.89 mg/dL — ABNORMAL HIGH (ref 0.44–1.00)
GFR, EST AFRICAN AMERICAN: 36 mL/min — AB (ref >60)
GFR, EST NON AFRICAN AMERICAN: 31 mL/min — AB (ref >60)
Glucose, Bld: 614 mg/dL (ref 65–99)
POTASSIUM: 4.1 mmol/L (ref 3.5–5.1)
Sodium: 129 mmol/L — ABNORMAL LOW (ref 135–145)
TOTAL PROTEIN: 6.3 g/dL — AB (ref 6.5–8.1)

## 2015-03-17 LAB — URINALYSIS, ROUTINE W REFLEX MICROSCOPIC
Bilirubin Urine: NEGATIVE
KETONES UR: NEGATIVE mg/dL
Leukocytes, UA: NEGATIVE
NITRITE: NEGATIVE
PH: 6 (ref 5.0–8.0)
Protein, ur: >300 mg/dL — AB
SPECIFIC GRAVITY, URINE: 1.015 (ref 1.005–1.030)
Urobilinogen, UA: 0.2 mg/dL (ref 0.0–1.0)

## 2015-03-17 LAB — CBC WITH DIFFERENTIAL/PLATELET
BASOS PCT: 1 % (ref 0–1)
Basophils Absolute: 0.1 10*3/uL (ref 0.0–0.1)
Eosinophils Absolute: 0.1 10*3/uL (ref 0.0–0.7)
Eosinophils Relative: 2 % (ref 0–5)
HEMATOCRIT: 36.9 % (ref 36.0–46.0)
Hemoglobin: 12.6 g/dL (ref 12.0–15.0)
LYMPHS ABS: 2.8 10*3/uL (ref 0.7–4.0)
Lymphocytes Relative: 43 % (ref 12–46)
MCH: 29 pg (ref 26.0–34.0)
MCHC: 34.1 g/dL (ref 30.0–36.0)
MCV: 84.8 fL (ref 78.0–100.0)
MONO ABS: 0.4 10*3/uL (ref 0.1–1.0)
MONOS PCT: 5 % (ref 3–12)
NEUTROS ABS: 3.3 10*3/uL (ref 1.7–7.7)
NEUTROS PCT: 49 % (ref 43–77)
Platelets: 243 10*3/uL (ref 150–400)
RBC: 4.35 MIL/uL (ref 3.87–5.11)
RDW: 13.6 % (ref 11.5–15.5)
WBC: 6.6 10*3/uL (ref 4.0–10.5)

## 2015-03-17 LAB — URINE MICROSCOPIC-ADD ON

## 2015-03-17 LAB — LIPASE, BLOOD: LIPASE: 50 U/L (ref 22–51)

## 2015-03-17 LAB — CBG MONITORING, ED: Glucose-Capillary: 583 mg/dL (ref 65–99)

## 2015-03-17 MED ORDER — SODIUM CHLORIDE 0.9 % IV BOLUS (SEPSIS)
1000.0000 mL | Freq: Once | INTRAVENOUS | Status: AC
  Administered 2015-03-17: 1000 mL via INTRAVENOUS

## 2015-03-17 MED ORDER — MORPHINE SULFATE (PF) 4 MG/ML IV SOLN
4.0000 mg | Freq: Once | INTRAVENOUS | Status: AC
  Administered 2015-03-17: 4 mg via INTRAVENOUS
  Filled 2015-03-17: qty 1

## 2015-03-17 MED ORDER — ONDANSETRON HCL 4 MG/2ML IJ SOLN
4.0000 mg | Freq: Once | INTRAMUSCULAR | Status: AC
  Administered 2015-03-17: 4 mg via INTRAMUSCULAR
  Filled 2015-03-17: qty 2

## 2015-03-17 MED ORDER — FLUCONAZOLE 100 MG PO TABS
150.0000 mg | ORAL_TABLET | Freq: Once | ORAL | Status: AC
  Administered 2015-03-17: 150 mg via ORAL
  Filled 2015-03-17: qty 2

## 2015-03-17 MED ORDER — INSULIN ASPART 100 UNIT/ML ~~LOC~~ SOLN
10.0000 [IU] | Freq: Once | SUBCUTANEOUS | Status: AC
  Administered 2015-03-17: 10 [IU] via INTRAVENOUS
  Filled 2015-03-17: qty 1

## 2015-03-17 NOTE — ED Notes (Signed)
CRITICAL VALUE ALERT  Critical value received:  Glucose 614  Date of notification:  [id number]  Time of notification:  2330  Critical value read back:Yes.    Nurse who received alert:  JRM  MD notified (1st page):  EDP  Time of first page:  2330  MD notified (2nd page):  Time of second page:  Responding MD:  EDP  Time MD responded:  2330

## 2015-03-17 NOTE — ED Notes (Signed)
Pt states she has been having lower abdominal pain and back pain with dysuria and nausea x 2-3 days.

## 2015-03-18 LAB — CBG MONITORING, ED
GLUCOSE-CAPILLARY: 475 mg/dL — AB (ref 65–99)
Glucose-Capillary: 366 mg/dL — ABNORMAL HIGH (ref 65–99)

## 2015-03-18 MED ORDER — FLUCONAZOLE 100 MG PO TABS: 100.0000 mg | ORAL_TABLET | Freq: Every day | ORAL | Status: AC

## 2015-03-18 MED ORDER — INSULIN ASPART 100 UNIT/ML ~~LOC~~ SOLN
6.0000 [IU] | Freq: Once | SUBCUTANEOUS | Status: AC
  Administered 2015-03-18: 6 [IU] via INTRAVENOUS

## 2015-03-18 NOTE — Discharge Instructions (Signed)

## 2015-03-18 NOTE — ED Provider Notes (Signed)
CSN: [id number]     Arrival date & time 03/17/15  2125 History   First MD Initiated Contact with Patient 03/17/15 2243     Chief Complaint  Patient presents with  . Abdominal Pain     (Consider location/radiation/quality/duration/timing/severity/associated sxs/prior Treatment) The history is provided by the patient.   Martha [name] is a 49 y.o. female with a past medical history most significant for diabetes and hypertension along with medication noncompliance presenting with complaints of nausea without emesis, low back pain with radiation into her lower abdomen along with increased urinary frequency and burning and itching with urination which has been present for the past 2-3 days.  She denies hematuria or cloudy urine production, stating her urine has actually been more clear and she has been increasingly thirsty and has increased her water intake significantly.  She does endorse blood glucose levels in the 400-500 range most recently.  She is taking her insulin as instructed including Lantus plus sliding scale regular insulin, is also spoke to take metformin but is unable to tolerate swallowing given the size of this tablet.  She has tried to break it in half and even crush it but is still unable to swallow.  She denies fevers or chills, denies vomiting, cough shortness of breath or chest pain.  She reports chronic diarrhea since her cholecystectomy which is not worsened today.  She denies weakness or dizziness.  Her last meal was consumed 2 hours before arriving here.      Past Medical History  Diagnosis Date  . Diabetes mellitus   . Hypertension   . RLS (restless legs syndrome)   . Acid reflux   . Bipolar 1 disorder   . Depression   . OSA (obstructive sleep apnea)     cpap  . Anemia   . Arthritis   . Degenerative disc disease, thoracic   . Shortness of breath   . Obesity (BMI 30-39.9)   . COPD (chronic obstructive pulmonary disease) 2014    bronchitis   Past Surgical History   Procedure Laterality Date  . Tubal ligation    . Endometrial ablation    . Colonoscopy  06/06/2010    [id number] bleeding secondary to internal hemorrhoids but incomplete evaluation secondary to poor right colon prep/small rectal and sigmoid colon polyps (hyperplastic). PROPOFOL  . Esophagogastroduodenoscopy  06/06/2010    [id number] erythema and edema of body of stomach, with sessile polypoid lesions. bx benign. no h.pylori  . Colonoscopy  May 2013    Dr. Gilliam/[facility]: 5 mm a descending colon polyp, hyperplastic. Adequate bowel prep.  . Flexible sigmoidoscopy N/A 05/17/2013    Dr. Oneida Alar: moderate sized internal hemorrhoids  . Esophagogastroduodenoscopy (egd) with propofol N/A 05/17/2013    Dr. Oneida Alar: normal esophagus, moderate nodular gastritis, negative path, empiric Savary dilation  . Savory dilation N/A 05/17/2013    Procedure: SAVORY DILATION;  Surgeon: [name], MD;  Location: [facility];  Service: Endoscopy;  Laterality: N/A;  14/15/16  . Hemorrhoid banding N/A 05/17/2013    Procedure: HEMORRHOID BANDING;  Surgeon: [name], MD;  Location: [facility];  Service: Endoscopy;  Laterality: N/A;  2 bands placed  . Esophageal biopsy N/A 05/17/2013    Procedure: BIOPSY;  Surgeon: [name], MD;  Location: [facility];  Service: Endoscopy;  Laterality: N/A;  . Irrigation and debridement abscess Right 06/01/2013    Procedure: INCISION AND DRAINAGE AND DEBRIDEMENT ABSCESS RIGHT BREAST;  Surgeon: [name], MD;  Location: [facility];  Service: General;  Laterality: Right;  . Incision and drainage abscess Left 10/11/2013    Procedure: INCISION AND DRAINAGE AND DEBRIDEMENT LEFT BREAST  ABSCESS;  Surgeon: [name], MD;  Location: [facility];  Service: General;  Laterality: Left;  . Cholecystectomy N/A 04/27/2014    Procedure: LAPAROSCOPIC CHOLECYSTECTOMY;  Surgeon: [name], MD;  Location: [facility];  Service: General;  Laterality: N/A;   Family History  Problem Relation  Age of Onset  . Adopted: Yes  . Family history unknown: Yes   Social History  Substance Use Topics  . Smoking status: Former Smoker -- 1.50 packs/day for 25 years    Types: Cigarettes    Quit date: 10/23/2007  . Smokeless tobacco: Former Systems developer     Comment: Quit x 6 years ago  . Alcohol Use: No   OB History    Gravida Para Term Preterm AB TAB SAB Ectopic Multiple Living   2 2 1 1      2      Review of Systems  Constitutional: Negative for fever and chills.  HENT: Negative for congestion and sore throat.   Eyes: Negative.   Respiratory: Negative for chest tightness and shortness of breath.   Cardiovascular: Negative for chest pain.  Gastrointestinal: Positive for nausea, abdominal pain and diarrhea. Negative for vomiting.  Endocrine: Positive for polydipsia and polyuria. Negative for polyphagia.  Genitourinary: Positive for dysuria. Negative for urgency, hematuria and vaginal discharge.  Musculoskeletal: Negative for joint swelling, arthralgias and neck pain.  Skin: Negative.  Negative for rash and wound.  Neurological: Negative for dizziness, weakness, light-headedness, numbness and headaches.  Psychiatric/Behavioral: Negative.       Allergies  Codeine  Home Medications   Prior to Admission medications   Medication Sig Start Date End Date Taking? Authorizing Provider  albuterol (PROVENTIL) (2.5 MG/3ML) 0.083% nebulizer solution Take 2.5 mg by nebulization 4 (four) times daily.    Yes Historical Provider, MD  ALPRAZolam Duanne Moron) 1 MG tablet Take 1 mg by mouth 4 (four) times daily.     Yes Historical Provider, MD  amLODipine (NORVASC) 10 MG tablet Take 10 mg by mouth daily.   Yes Historical Provider, MD  calcium carbonate (TUMS - DOSED IN MG ELEMENTAL CALCIUM) 500 MG chewable tablet Chew 2 tablets by mouth daily as needed for indigestion or heartburn.   Yes Historical Provider, MD  celecoxib (CELEBREX) 100 MG capsule Take 1 capsule (100 mg total) by mouth 2 (two) times daily.  07/23/14  Yes Lily Kocher, PA-C  diclofenac (VOLTAREN) 75 MG EC tablet Take 1 tablet (75 mg total) by mouth 2 (two) times daily with a meal. 10/17/14  Yes Carole Civil, MD  dicyclomine (BENTYL) 10 MG capsule Take 1 capsule (10 mg total) by mouth 4 (four) times daily -  before meals and at bedtime. 02/09/14  Yes Orvil Feil, NP  DULoxetine (CYMBALTA) 60 MG capsule Take 60 mg by mouth daily.   Yes Historical Provider, MD  esomeprazole (NEXIUM) 40 MG capsule Take 40 mg by mouth every morning.   Yes Historical Provider, MD  gabapentin (NEURONTIN) 300 MG capsule Take 300 mg by mouth 3 (three) times daily.   Yes Historical Provider, MD  HYDROcodone-acetaminophen (NORCO) 5-325 MG per tablet Take 1 tablet by mouth every 4 (four) hours as needed for moderate pain. [id number]  Yes Delora Fuel, MD  insulin aspart protamine- aspart (NOVOLOG MIX 70/30) (70-30) 100 UNIT/ML injection Inject 1-25 Units into the skin 2 (two) times daily.  Yes Historical Provider, MD  insulin glargine (LANTUS) 100 UNIT/ML injection Inject 65 Units into the skin 2 (two) times daily.   Yes Historical Provider, MD  lisinopril-hydrochlorothiazide (PRINZIDE,ZESTORETIC) 20-12.5 MG per tablet Take 1 tablet by mouth 2 (two) times daily.    Yes Historical Provider, MD  meloxicam (MOBIC) 15 MG tablet Take 15 mg by mouth daily.   Yes Historical Provider, MD  metFORMIN (GLUCOPHAGE) 1000 MG tablet Take 1,000 mg by mouth 2 (two) times daily with a meal.   Yes Historical Provider, MD  rOPINIRole (REQUIP) 1 MG tablet Take 1 mg by mouth at bedtime.    Yes Historical Provider, MD  simvastatin (ZOCOR) 40 MG tablet Take 40 mg by mouth every evening.   Yes Historical Provider, MD  fluconazole (DIFLUCAN) 100 MG tablet Take 1 tablet (100 mg total) by mouth daily. 03/18/15 03/25/15  Evalee Jefferson, PA-C   BP 195/108 mmHg  Pulse 77  Temp(Src) 97.8 F (36.6 C) (Oral)  Resp 16  Ht 5\' 8"  (1.727 m)  Wt 355 lb (161.027 kg)  BMI 53.99 kg/m2  SpO2  100% Physical Exam  Constitutional: She appears well-developed and well-nourished.  HENT:  Head: Normocephalic and atraumatic.  Mouth/Throat: Mucous membranes are dry.  Neck: Normal range of motion.  Cardiovascular: Normal rate, regular rhythm, normal heart sounds and intact distal pulses.   Pulmonary/Chest: Effort normal and breath sounds normal. She has no wheezes.  Abdominal: Soft. Bowel sounds are normal. There is no tenderness. There is no rebound, no guarding and no CVA tenderness.  Musculoskeletal: Normal range of motion.  Neurological: She is alert.  Skin: Skin is warm and dry.  Psychiatric: She has a normal mood and affect.  Nursing note and vitals reviewed.   ED Course  Procedures (including critical care time) Labs Review Labs Reviewed  URINALYSIS, ROUTINE W REFLEX MICROSCOPIC (NOT AT [facility]) - Abnormal; Notable for the following:    APPearance HAZY (*)    Glucose, UA >1000 (*)    Hgb urine dipstick SMALL (*)    Protein, ur >300 (*)    All other components within normal limits  COMPREHENSIVE METABOLIC PANEL - Abnormal; Notable for the following:    Sodium 129 (*)    Chloride 98 (*)    CO2 21 (*)    Glucose, Bld 614 (*)    BUN 27 (*)    Creatinine, Ser 1.89 (*)    Calcium 8.8 (*)    Total Protein 6.3 (*)    Albumin 3.0 (*)    Total Bilirubin 0.1 (*)    GFR calc non Af Amer 31 (*)    GFR calc Af Amer 36 (*)    All other components within normal limits  URINE MICROSCOPIC-ADD ON - Abnormal; Notable for the following:    Squamous Epithelial / LPF MANY (*)    Bacteria, UA MANY (*)    All other components within normal limits  CBG MONITORING, ED - Abnormal; Notable for the following:    Glucose-Capillary 583 (*)    All other components within normal limits  CBG MONITORING, ED - Abnormal; Notable for the following:    Glucose-Capillary 475 (*)    All other components within normal limits  CBG MONITORING, ED - Abnormal; Notable for the following:     Glucose-Capillary 366 (*)    All other components within normal limits  URINE CULTURE  CBC WITH DIFFERENTIAL/PLATELET  LIPASE, BLOOD    Imaging Review No results found. I have personally reviewed and  evaluated these images and lab results as part of my medical decision-making.   EKG Interpretation None      MDM   Final diagnoses:  Hyperglycemia without ketosis  Noncompliance with medication regimen  Yeast cystitis    Patients  labs reviewed.    Results were also discussed with patient.  Pt with hyperglycemia without anion gap, no ketosis.  Renal insufficiency which is chronic in comparison to previous labs.  Corrected Na+ 137.  She was given NS per IV - 2 L along with insulin IV, first 10 units, than and additional 6 units.  Yeast seen on urinalysis - denies vaginal dc.  Treated with diflucan assuming yeast cystitis - 100 mg daily x 4 days.  Pt advised she needs f/u with her pcp this week.  She is not complying with her metformin due to tolerance.  She needs medication review with pcp for better DM control.  Pt understands and will call for appt recheck.  The patient appears reasonably screened and/or stabilized for discharge and I doubt any other medical condition or other [facility] requiring further screening, evaluation, or treatment in the ED at this time prior to discharge.      Evalee Jefferson, PA-C 03/18/15 0249  Davonna Belling, MD 03/18/15 1535

## 2015-03-18 NOTE — ED Notes (Signed)
Patient verbalizes understanding of discharge instructions, prescription medications, home care and follow up care with PCP. Patient out of department at this time with family member.

## 2015-03-20 ENCOUNTER — Other Ambulatory Visit (HOSPITAL_COMMUNITY): Payer: Self-pay | Admitting: Pulmonary Disease

## 2015-03-20 DIAGNOSIS — E041 Nontoxic single thyroid nodule: Secondary | ICD-10-CM

## 2015-03-20 LAB — URINE CULTURE

## 2015-03-23 ENCOUNTER — Ambulatory Visit (HOSPITAL_COMMUNITY): Admission: RE | Admit: 2015-03-23 | Payer: Medicaid Other | Source: Ambulatory Visit

## 2015-03-27 ENCOUNTER — Telehealth: Payer: Self-pay | Admitting: Gastroenterology

## 2015-03-27 ENCOUNTER — Encounter: Payer: Self-pay | Admitting: Gastroenterology

## 2015-03-27 ENCOUNTER — Ambulatory Visit: Payer: Medicaid Other | Admitting: Gastroenterology

## 2015-03-27 NOTE — Telephone Encounter (Signed)
PATIENT WAS A NO SHOW AND LETTER SENT  °

## 2015-03-28 ENCOUNTER — Ambulatory Visit (HOSPITAL_COMMUNITY)
Admission: RE | Admit: 2015-03-28 | Discharge: 2015-03-28 | Disposition: A | Discharge status: Home / Self Care | Payer: Medicaid Other | Source: Ambulatory Visit | Attending: Pulmonary Disease | Admitting: Pulmonary Disease

## 2015-03-28 DIAGNOSIS — E042 Nontoxic multinodular goiter: Secondary | ICD-10-CM | POA: Diagnosis not present

## 2015-03-28 DIAGNOSIS — E010 Iodine-deficiency related diffuse (endemic) goiter: Secondary | ICD-10-CM | POA: Diagnosis not present

## 2015-03-28 DIAGNOSIS — E041 Nontoxic single thyroid nodule: Secondary | ICD-10-CM | POA: Diagnosis present

## 2015-04-10 ENCOUNTER — Other Ambulatory Visit (HOSPITAL_COMMUNITY): Payer: Self-pay | Admitting: Pulmonary Disease

## 2015-04-10 DIAGNOSIS — E041 Nontoxic single thyroid nodule: Secondary | ICD-10-CM

## 2015-04-16 ENCOUNTER — Ambulatory Visit (HOSPITAL_COMMUNITY)
Admission: RE | Admit: 2015-04-16 | Discharge: 2015-04-16 | Disposition: A | Discharge status: Home / Self Care | Payer: Medicaid Other | Source: Ambulatory Visit | Attending: Pulmonary Disease | Admitting: Pulmonary Disease

## 2015-04-16 ENCOUNTER — Encounter (HOSPITAL_COMMUNITY): Payer: Self-pay

## 2015-04-16 DIAGNOSIS — E041 Nontoxic single thyroid nodule: Secondary | ICD-10-CM | POA: Diagnosis not present

## 2015-04-16 MED ORDER — LIDOCAINE HCL (PF) 2 % IJ SOLN
INTRAMUSCULAR | Status: AC
  Administered 2015-04-16: 10 mL
  Filled 2015-04-16: qty 10

## 2015-04-16 NOTE — Discharge Instructions (Signed)
Thyroid Biopsy °The thyroid gland is a butterfly-shaped gland situated in the front of the neck. It produces hormones which affect metabolism, growth and development, and body temperature. A thyroid biopsy is a procedure in which small samples of tissue or fluid are removed from the thyroid gland or mass and examined under a microscope. This test is done to determine the cause of thyroid problems, such as infection, cancer, or other thyroid problems. °There are 2 ways to obtain samples: °1. Fine needle biopsy. Samples are removed using a thin needle inserted through the skin and into the thyroid gland or mass. °2. Open biopsy. Samples are removed after a cut (incision) is made through the skin. °LET YOUR CAREGIVER KNOW ABOUT:  °· Allergies. °· Medications taken including herbs, eye drops, over-the-counter medications, and creams. °· Use of steroids (by mouth or creams). °· Previous problems with anesthetics or numbing medicine. °· Possibility of pregnancy, if this applies. °· History of blood clots (thrombophlebitis). °· History of bleeding or blood problems. °· Previous surgery. °· Other health problems. °RISKS AND COMPLICATIONS °· Bleeding from the site. The risk of bleeding is higher if you have a bleeding disorder or are taking any blood thinning medications (anticoagulants). °· Infection. °· Injury to structures near the thyroid gland. °BEFORE THE PROCEDURE  °This is a procedure that can be done as an outpatient. Confirm the time that you need to arrive for your procedure. Confirm whether there is a need to fast or withhold any medications. A blood sample may be done to determine your blood clotting time. Medicine may be given to help you relax (sedative). °PROCEDURE °Fine needle biopsy. °You will be awake during the procedure. You may be asked to lie on your back with your head tipped backward to extend your neck. Let your caregiver know if you cannot tolerate the positioning. An area on your neck will be  cleansed. A needle is inserted through the skin of your neck. You may feel a mild discomfort during this procedure. You may be asked to avoid coughing, talking, swallowing, or making sounds during some portions of the procedure. The needle is withdrawn once tissue or fluid samples have been removed. Pressure may be applied to the neck to reduce swelling and ensure that bleeding has stopped. The samples will be sent for examination.  °Open biopsy. °You will be given general anesthesia. You will be asleep during the procedure. An incision is made in your neck. A sample of thyroid tissue or the mass is removed. The tissue sample or mass will be sent for examination. The sample or mass may be examined during the biopsy. If the sample or mass contains cancer cells, some or all of the thyroid gland may be removed. The incision is closed with stitches. °AFTER THE PROCEDURE  °Your recovery will be assessed and monitored. If there are no problems, as an outpatient, you should be able to go home shortly after the procedure. °If you had a fine needle biopsy: °· You may have soreness at the biopsy site for 1 to 2 days. °If you had an open biopsy:  °· You may have soreness at the biopsy site for 3 to 4 days. °· You may have a hoarse voice or sore throat for 1 to 2 days. °Obtaining the Test Results °It is your responsibility to obtain your test results. Do not assume everything is normal if you have not heard from your caregiver or the medical facility. It is important for you to follow up   on all of your test results. °HOME CARE INSTRUCTIONS  °· Keeping your head raised on a pillow when you are lying down may ease biopsy site discomfort. °· Supporting the back of your head and neck with both hands as you sit up from a lying position may ease biopsy site discomfort. °· Only take over-the-counter or prescription medicines for pain, discomfort, or fever as directed by your caregiver. °· Throat lozenges or gargling with warm salt  water may help to soothe a sore throat. °SEEK IMMEDIATE MEDICAL CARE IF:  °· You have severe bleeding from the biopsy site. °· You have difficulty swallowing. °· You have a fever. °· You have increased pain, swelling, redness, or warmth at the biopsy site. °· You notice pus coming from the biopsy site. °· You have swollen glands (lymph nodes) in your neck. °Document Released: 05/11/2007 Document Revised: 11/08/2012 Document Reviewed: 10/06/2013 °[employer]® Patient Information ©2015 [employer]. This information is not intended to replace advice given to you by your health care provider. Make sure you discuss any questions you have with your health care provider. ° °

## 2015-06-10 ENCOUNTER — Emergency Department (HOSPITAL_COMMUNITY)
Admission: EM | Admit: 2015-06-10 | Discharge: 2015-06-10 | Disposition: A | Discharge status: Home / Self Care | Payer: Medicaid Other | Attending: Emergency Medicine | Admitting: Emergency Medicine

## 2015-06-10 ENCOUNTER — Encounter (HOSPITAL_COMMUNITY): Payer: Self-pay | Admitting: Cardiology

## 2015-06-10 DIAGNOSIS — M79602 Pain in left arm: Secondary | ICD-10-CM | POA: Diagnosis not present

## 2015-06-10 DIAGNOSIS — E669 Obesity, unspecified: Secondary | ICD-10-CM | POA: Diagnosis not present

## 2015-06-10 DIAGNOSIS — Z79899 Other long term (current) drug therapy: Secondary | ICD-10-CM | POA: Insufficient documentation

## 2015-06-10 DIAGNOSIS — M5412 Radiculopathy, cervical region: Secondary | ICD-10-CM | POA: Diagnosis not present

## 2015-06-10 DIAGNOSIS — Z87891 Personal history of nicotine dependence: Secondary | ICD-10-CM | POA: Diagnosis not present

## 2015-06-10 DIAGNOSIS — K219 Gastro-esophageal reflux disease without esophagitis: Secondary | ICD-10-CM | POA: Insufficient documentation

## 2015-06-10 DIAGNOSIS — G2581 Restless legs syndrome: Secondary | ICD-10-CM | POA: Insufficient documentation

## 2015-06-10 DIAGNOSIS — Z9981 Dependence on supplemental oxygen: Secondary | ICD-10-CM | POA: Insufficient documentation

## 2015-06-10 DIAGNOSIS — M542 Cervicalgia: Secondary | ICD-10-CM | POA: Diagnosis present

## 2015-06-10 DIAGNOSIS — G4733 Obstructive sleep apnea (adult) (pediatric): Secondary | ICD-10-CM | POA: Insufficient documentation

## 2015-06-10 DIAGNOSIS — Z862 Personal history of diseases of the blood and blood-forming organs and certain disorders involving the immune mechanism: Secondary | ICD-10-CM | POA: Diagnosis not present

## 2015-06-10 DIAGNOSIS — E119 Type 2 diabetes mellitus without complications: Secondary | ICD-10-CM | POA: Diagnosis not present

## 2015-06-10 DIAGNOSIS — Z791 Long term (current) use of non-steroidal anti-inflammatories (NSAID): Secondary | ICD-10-CM | POA: Insufficient documentation

## 2015-06-10 DIAGNOSIS — I10 Essential (primary) hypertension: Secondary | ICD-10-CM | POA: Diagnosis not present

## 2015-06-10 DIAGNOSIS — J449 Chronic obstructive pulmonary disease, unspecified: Secondary | ICD-10-CM | POA: Insufficient documentation

## 2015-06-10 DIAGNOSIS — F319 Bipolar disorder, unspecified: Secondary | ICD-10-CM | POA: Diagnosis not present

## 2015-06-10 DIAGNOSIS — Z794 Long term (current) use of insulin: Secondary | ICD-10-CM | POA: Diagnosis not present

## 2015-06-10 DIAGNOSIS — M1990 Unspecified osteoarthritis, unspecified site: Secondary | ICD-10-CM | POA: Diagnosis not present

## 2015-06-10 MED ORDER — KETOROLAC TROMETHAMINE 60 MG/2ML IM SOLN
60.0000 mg | Freq: Once | INTRAMUSCULAR | Status: AC
  Administered 2015-06-10: 60 mg via INTRAMUSCULAR
  Filled 2015-06-10: qty 2

## 2015-06-10 MED ORDER — DEXAMETHASONE SODIUM PHOSPHATE 10 MG/ML IJ SOLN
10.0000 mg | Freq: Once | INTRAMUSCULAR | Status: AC
  Administered 2015-06-10: 10 mg via INTRAMUSCULAR
  Filled 2015-06-10: qty 1

## 2015-06-10 MED ORDER — IBUPROFEN 800 MG PO TABS: 800.0000 mg | ORAL_TABLET | Freq: Three times a day (TID) | ORAL | Status: DC

## 2015-06-10 NOTE — ED Notes (Signed)
MD at bedside. 

## 2015-06-10 NOTE — ED Notes (Signed)
C/o left neck pain

## 2015-06-10 NOTE — Discharge Instructions (Signed)
Your sugar will probably go up the next week because of the steroid shot - keep a close eye on this by taking your blood sugar every 3 hours - take insulin as needed.  Please obtain all of your results from medical records or have your doctors office obtain the results - share them with your doctor - you should be seen at your doctors office in the next 2 days. Call today to arrange your follow up. Take the medications as prescribed. Please review all of the medicines and only take them if you do not have an allergy to them. Please be aware that if you are taking birth control pills, taking other prescriptions, ESPECIALLY ANTIBIOTICS may make the birth control ineffective - if this is the case, either do not engage in sexual activity or use alternative methods of birth control such as condoms until you have finished the medicine and your family doctor says it is OK to restart them. If you are on a blood thinner such as COUMADIN, be aware that any other medicine that you take may cause the coumadin to either work too much, or not enough - you should have your coumadin level rechecked in next 7 days if this is the case.  ?  It is also a possibility that you have an allergic reaction to any of the medicines that you have been prescribed - Everybody reacts differently to medications and while MOST people have no trouble with most medicines, you may have a reaction such as nausea, vomiting, rash, swelling, shortness of breath. If this is the case, please stop taking the medicine immediately and contact your physician.  ?  You should return to the ER if you develop severe or worsening symptoms.

## 2015-06-10 NOTE — ED Provider Notes (Signed)
CSN: [id number]     Arrival date & time 06/10/15  1108 History  By signing my name below, I, Stephania Fragmin, attest that this documentation has been prepared under the direction and in the presence of Noemi Chapel, MD. Electronically Signed: Stephania Fragmin, ED Scribe. 06/10/2015. 12:21 PM.  Chief Complaint  Patient presents with  . Extremity Pain   The history is provided by the patient. No language interpreter was used.    HPI Comments: Martha [name] is a 49 y.o. female who presents to the Emergency Department complaining of unprovoked, sharp, shooting left arm pain, mainly in her upper arm near her shoulder, that began 1 week. She denies any falls, overuse injuries, or change in activities. She complains of associated neck pain and intermittent numbness in her left hand and notes difficulty sleeping secondary to the pain. Patient notes a history of DM.   Past Medical History  Diagnosis Date  . Diabetes mellitus   . Hypertension   . RLS (restless legs syndrome)   . Acid reflux   . Bipolar 1 disorder ([place])   . Depression   . OSA (obstructive sleep apnea)     cpap  . Anemia   . Arthritis   . Degenerative disc disease, thoracic   . Shortness of breath   . Obesity (BMI 30-39.9)   . COPD (chronic obstructive pulmonary disease) ([place]) 2014    bronchitis   Past Surgical History  Procedure Laterality Date  . Tubal ligation    . Endometrial ablation    . Colonoscopy  06/06/2010    [id number] bleeding secondary to internal hemorrhoids but incomplete evaluation secondary to poor right colon prep/small rectal and sigmoid colon polyps (hyperplastic). PROPOFOL  . Esophagogastroduodenoscopy  06/06/2010    [id number] erythema and edema of body of stomach, with sessile polypoid lesions. bx benign. no h.pylori  . Colonoscopy  May 2013    Dr. Gilliam/[facility]: 5 mm a descending colon polyp, hyperplastic. Adequate bowel prep.  . Flexible sigmoidoscopy N/A 05/17/2013    Dr. Oneida Alar: moderate sized internal  hemorrhoids  . Esophagogastroduodenoscopy (egd) with propofol N/A 05/17/2013    Dr. Oneida Alar: normal esophagus, moderate nodular gastritis, negative path, empiric Savary dilation  . Savory dilation N/A 05/17/2013    Procedure: SAVORY DILATION;  Surgeon: [name], MD;  Location: [facility];  Service: Endoscopy;  Laterality: N/A;  14/15/16  . Hemorrhoid banding N/A 05/17/2013    Procedure: HEMORRHOID BANDING;  Surgeon: [name], MD;  Location: [facility];  Service: Endoscopy;  Laterality: N/A;  2 bands placed  . Esophageal biopsy N/A 05/17/2013    Procedure: BIOPSY;  Surgeon: [name], MD;  Location: [facility];  Service: Endoscopy;  Laterality: N/A;  . Irrigation and debridement abscess Right 06/01/2013    Procedure: INCISION AND DRAINAGE AND DEBRIDEMENT ABSCESS RIGHT BREAST;  Surgeon: [name], MD;  Location: [facility];  Service: General;  Laterality: Right;  . Incision and drainage abscess Left 10/11/2013    Procedure: INCISION AND DRAINAGE AND DEBRIDEMENT LEFT BREAST  ABSCESS;  Surgeon: [name], MD;  Location: [facility];  Service: General;  Laterality: Left;  . Cholecystectomy N/A 04/27/2014    Procedure: LAPAROSCOPIC CHOLECYSTECTOMY;  Surgeon: [name], MD;  Location: [facility];  Service: General;  Laterality: N/A;   Family History  Problem Relation Age of Onset  . Adopted: Yes  . Family history unknown: Yes   Social History  Substance Use Topics  . Smoking status: Former Smoker --  1.50 packs/day for 25 years    Types: Cigarettes    Quit date: 10/23/2007  . Smokeless tobacco: Former Systems developer     Comment: Quit x 6 years ago  . Alcohol Use: No   OB History    Gravida Para Term Preterm AB TAB SAB Ectopic Multiple Living   2 2 1 1      2      Review of Systems  Musculoskeletal: Positive for neck pain.  Neurological: Positive for numbness.   Allergies  Codeine  Home Medications   Prior to Admission medications   Medication Sig Start Date End Date Taking?  Authorizing Provider  albuterol (PROVENTIL) (2.5 MG/3ML) 0.083% nebulizer solution Take 2.5 mg by nebulization 4 (four) times daily.     Historical Provider, MD  ALPRAZolam Duanne Moron) 1 MG tablet Take 1 mg by mouth 4 (four) times daily.      Historical Provider, MD  amLODipine (NORVASC) 10 MG tablet Take 10 mg by mouth daily.    Historical Provider, MD  calcium carbonate (TUMS - DOSED IN MG ELEMENTAL CALCIUM) 500 MG chewable tablet Chew 2 tablets by mouth daily as needed for indigestion or heartburn.    Historical Provider, MD  celecoxib (CELEBREX) 100 MG capsule Take 1 capsule (100 mg total) by mouth 2 (two) times daily. 07/23/14   Lily Kocher, PA-C  diclofenac (VOLTAREN) 75 MG EC tablet Take 1 tablet (75 mg total) by mouth 2 (two) times daily with a meal. 10/17/14   Carole Civil, MD  dicyclomine (BENTYL) 10 MG capsule Take 1 capsule (10 mg total) by mouth 4 (four) times daily -  before meals and at bedtime. 02/09/14   Orvil Feil, NP  DULoxetine (CYMBALTA) 60 MG capsule Take 60 mg by mouth daily.    Historical Provider, MD  esomeprazole (NEXIUM) 40 MG capsule Take 40 mg by mouth every morning.    Historical Provider, MD  gabapentin (NEURONTIN) 300 MG capsule Take 300 mg by mouth 3 (three) times daily.    Historical Provider, MD  HYDROcodone-acetaminophen (NORCO) 5-325 MG per tablet Take 1 tablet by mouth every 4 (four) hours as needed for moderate pain. [id number]   Delora Fuel, MD  ibuprofen (ADVIL,MOTRIN) 800 MG tablet Take 1 tablet (800 mg total) by mouth 3 (three) times daily. 06/10/15   Noemi Chapel, MD  insulin aspart protamine- aspart (NOVOLOG MIX 70/30) (70-30) 100 UNIT/ML injection Inject 1-25 Units into the skin 2 (two) times daily.    Historical Provider, MD  insulin glargine (LANTUS) 100 UNIT/ML injection Inject 65 Units into the skin 2 (two) times daily.    Historical Provider, MD  lisinopril-hydrochlorothiazide (PRINZIDE,ZESTORETIC) 20-12.5 MG per tablet Take 1 tablet by mouth 2 (two)  times daily.     Historical Provider, MD  meloxicam (MOBIC) 15 MG tablet Take 15 mg by mouth daily.    Historical Provider, MD  metFORMIN (GLUCOPHAGE) 1000 MG tablet Take 1,000 mg by mouth 2 (two) times daily with a meal.    Historical Provider, MD  rOPINIRole (REQUIP) 1 MG tablet Take 1 mg by mouth at bedtime.     Historical Provider, MD  simvastatin (ZOCOR) 40 MG tablet Take 40 mg by mouth every evening.    Historical Provider, MD   BP 141/88 mmHg  Pulse 80  Temp(Src) 97.6 F (36.4 C) (Oral)  Resp 16  Ht 5\' 7"  (1.702 m)  Wt 350 lb (158.759 kg)  BMI 54.80 kg/m2  SpO2 99% Physical Exam  Constitutional: She is  oriented to person, place, and time. She appears well-developed and well-nourished. No distress.  HENT:  Head: Normocephalic and atraumatic.  Eyes: Conjunctivae and EOM are normal.  Neck: Neck supple. No tracheal deviation present.  Cardiovascular: Normal rate.   Pulmonary/Chest: Effort normal. No respiratory distress.  Musculoskeletal: Normal range of motion. She exhibits tenderness.  Increased pain with ROM of the left arm and palpation of the neck.  Normal range of motion of the shoulder elbow and wrist, pain with movement of each of these areas though there is no tenderness over the compartments him a joints appear supple, no restricted range of motion, no bony tenderness of the cervical spine, tenderness in the left lateral soft tissues  Neurological: She is alert and oriented to person, place, and time.  Normal strength and sensation of the entire left upper extremity and no weakness or numbness  Skin: Skin is warm and dry.  No skin changes, no rashes  Psychiatric: She has a normal mood and affect. Her behavior is normal.  Nursing note and vitals reviewed.   ED Course  Procedures (including critical care time)  DIAGNOSTIC STUDIES: Oxygen Saturation is 100% on RA, normal by my interpretation.    COORDINATION OF CARE: 12:20 PM - Suspect pinched nerve. Discussed  treatment plan with pt at bedside which includes steroid injection and Rx anti-inflammatory medications. Patient cautioned about possible hyperglycemic side-effects of steroids. F/u with PCP. Pt verbalized understanding and agreed to plan.    EKG Interpretation   Date/Time:  Sunday June 10 2015 11:23:47 EST Ventricular Rate:  80 PR Interval:  148 QRS Duration: 82 QT Interval:  368 QTC Calculation: 424 R Axis:   41 Text Interpretation:  Sinus rhythm Probable left atrial enlargement  Borderline T wave abnormalities Since last tracing nonspecific lateral T  wave abnormality Confirmed by Eulis Foster  MD, Vira Agar [id number]) on 06/10/2015  12:00:18 PM      MDM   Final diagnoses:  Left cervical radiculopathy    The patient's exam and history is consistent with a cervical radiculopathy, there is not appear to be any signs of zoster, no signs of trauma, no deformity, no swelling. She will be treated with the medications as below, she is in agreement, she also understands the side effects of the steroids and will monitor her blood sugar frequently. She agrees to follow-up closely with family doctor.   Meds given in ED:  Medications  dexamethasone (DECADRON) injection 10 mg (10 mg Intramuscular Given 06/10/15 1224)  ketorolac (TORADOL) injection 60 mg (60 mg Intramuscular Given 06/10/15 1222)    Discharge Medication List as of 06/10/2015 12:20 PM    START taking these medications   Details  ibuprofen (ADVIL,MOTRIN) 800 MG tablet Take 1 tablet (800 mg total) by mouth 3 (three) times daily., Starting 06/10/2015, Until Discontinued, Print       I personally performed the services described in this documentation, which was scribed in my presence. The recorded information has been reviewed and is accurate.        Noemi Chapel, MD 06/10/15 [phone]

## 2015-06-10 NOTE — ED Notes (Signed)
TTS completed. 

## 2015-06-10 NOTE — ED Notes (Signed)
Left shoulder ,  Left neck pain that goes down left arm.  Pain worse with movement.

## 2015-06-21 ENCOUNTER — Emergency Department (HOSPITAL_COMMUNITY): Payer: Medicaid Other

## 2015-06-21 ENCOUNTER — Encounter (HOSPITAL_COMMUNITY): Payer: Self-pay

## 2015-06-21 ENCOUNTER — Emergency Department (HOSPITAL_COMMUNITY)
Admission: EM | Admit: 2015-06-21 | Discharge: 2015-06-21 | Disposition: A | Discharge status: Home / Self Care | Payer: Medicaid Other | Attending: Emergency Medicine | Admitting: Emergency Medicine

## 2015-06-21 DIAGNOSIS — Z9119 Patient's noncompliance with other medical treatment and regimen: Secondary | ICD-10-CM | POA: Diagnosis not present

## 2015-06-21 DIAGNOSIS — R739 Hyperglycemia, unspecified: Secondary | ICD-10-CM

## 2015-06-21 DIAGNOSIS — Z79899 Other long term (current) drug therapy: Secondary | ICD-10-CM | POA: Diagnosis not present

## 2015-06-21 DIAGNOSIS — M542 Cervicalgia: Secondary | ICD-10-CM | POA: Insufficient documentation

## 2015-06-21 DIAGNOSIS — E669 Obesity, unspecified: Secondary | ICD-10-CM | POA: Diagnosis not present

## 2015-06-21 DIAGNOSIS — J3489 Other specified disorders of nose and nasal sinuses: Secondary | ICD-10-CM | POA: Insufficient documentation

## 2015-06-21 DIAGNOSIS — J449 Chronic obstructive pulmonary disease, unspecified: Secondary | ICD-10-CM | POA: Insufficient documentation

## 2015-06-21 DIAGNOSIS — G4733 Obstructive sleep apnea (adult) (pediatric): Secondary | ICD-10-CM | POA: Diagnosis not present

## 2015-06-21 DIAGNOSIS — F319 Bipolar disorder, unspecified: Secondary | ICD-10-CM | POA: Diagnosis not present

## 2015-06-21 DIAGNOSIS — Z87891 Personal history of nicotine dependence: Secondary | ICD-10-CM | POA: Insufficient documentation

## 2015-06-21 DIAGNOSIS — Z794 Long term (current) use of insulin: Secondary | ICD-10-CM | POA: Diagnosis not present

## 2015-06-21 DIAGNOSIS — Z791 Long term (current) use of non-steroidal anti-inflammatories (NSAID): Secondary | ICD-10-CM | POA: Insufficient documentation

## 2015-06-21 DIAGNOSIS — I10 Essential (primary) hypertension: Secondary | ICD-10-CM | POA: Diagnosis not present

## 2015-06-21 DIAGNOSIS — H81399 Other peripheral vertigo, unspecified ear: Secondary | ICD-10-CM | POA: Diagnosis not present

## 2015-06-21 DIAGNOSIS — Z91119 Patient's noncompliance with dietary regimen due to unspecified reason: Secondary | ICD-10-CM

## 2015-06-21 DIAGNOSIS — Z9981 Dependence on supplemental oxygen: Secondary | ICD-10-CM | POA: Insufficient documentation

## 2015-06-21 DIAGNOSIS — K219 Gastro-esophageal reflux disease without esophagitis: Secondary | ICD-10-CM | POA: Diagnosis not present

## 2015-06-21 DIAGNOSIS — Z3202 Encounter for pregnancy test, result negative: Secondary | ICD-10-CM | POA: Diagnosis not present

## 2015-06-21 DIAGNOSIS — E1165 Type 2 diabetes mellitus with hyperglycemia: Secondary | ICD-10-CM | POA: Insufficient documentation

## 2015-06-21 DIAGNOSIS — G2581 Restless legs syndrome: Secondary | ICD-10-CM | POA: Insufficient documentation

## 2015-06-21 DIAGNOSIS — M1990 Unspecified osteoarthritis, unspecified site: Secondary | ICD-10-CM | POA: Insufficient documentation

## 2015-06-21 DIAGNOSIS — R42 Dizziness and giddiness: Secondary | ICD-10-CM | POA: Diagnosis present

## 2015-06-21 DIAGNOSIS — Z9111 Patient's noncompliance with dietary regimen: Secondary | ICD-10-CM

## 2015-06-21 DIAGNOSIS — Z9114 Patient's other noncompliance with medication regimen: Secondary | ICD-10-CM

## 2015-06-21 HISTORY — DX: Radiculopathy, cervical region: M54.12

## 2015-06-21 HISTORY — DX: Patient's other noncompliance with medication regimen: Z91.14

## 2015-06-21 HISTORY — DX: Patient's other noncompliance with medication regimen for other reason: Z91.148

## 2015-06-21 LAB — I-STAT CHEM 8, ED
BUN: 29 mg/dL — ABNORMAL HIGH (ref 6–20)
CALCIUM ION: 1.15 mmol/L (ref 1.12–1.23)
CREATININE: 2.2 mg/dL — AB (ref 0.44–1.00)
Chloride: 98 mmol/L — ABNORMAL LOW (ref 101–111)
GLUCOSE: 671 mg/dL — AB (ref 65–99)
HEMATOCRIT: 40 % (ref 36.0–46.0)
HEMOGLOBIN: 13.6 g/dL (ref 12.0–15.0)
Potassium: 4.1 mmol/L (ref 3.5–5.1)
Sodium: 130 mmol/L — ABNORMAL LOW (ref 135–145)
TCO2: 21 mmol/L (ref 0–100)

## 2015-06-21 LAB — BLOOD GAS, ARTERIAL
Acid-base deficit: 3.8 mmol/L — ABNORMAL HIGH (ref 0.0–2.0)
Bicarbonate: 21.3 mEq/L (ref 20.0–24.0)
FIO2: 0.21
O2 Saturation: 95.8 %
PH ART: 7.364 (ref 7.350–7.450)
PO2 ART: 82.1 mmHg (ref 80.0–100.0)
pCO2 arterial: 37.1 mmHg (ref 35.0–45.0)

## 2015-06-21 LAB — PREGNANCY, URINE: Preg Test, Ur: NEGATIVE

## 2015-06-21 LAB — COMPREHENSIVE METABOLIC PANEL
ALBUMIN: 2.3 g/dL — AB (ref 3.5–5.0)
ALK PHOS: 93 U/L (ref 38–126)
ALT: 15 U/L (ref 14–54)
ANION GAP: 9 (ref 5–15)
AST: 10 U/L — AB (ref 15–41)
BILIRUBIN TOTAL: 0.5 mg/dL (ref 0.3–1.2)
BUN: 32 mg/dL — ABNORMAL HIGH (ref 6–20)
CHLORIDE: 95 mmol/L — AB (ref 101–111)
CO2: 25 mmol/L (ref 22–32)
CREATININE: 2.28 mg/dL — AB (ref 0.44–1.00)
Calcium: 8.4 mg/dL — ABNORMAL LOW (ref 8.9–10.3)
GFR calc non Af Amer: 24 mL/min — ABNORMAL LOW (ref >60)
GFR, EST AFRICAN AMERICAN: 28 mL/min — AB (ref >60)
Glucose, Bld: 683 mg/dL (ref 65–99)
POTASSIUM: 4 mmol/L (ref 3.5–5.1)
Sodium: 129 mmol/L — ABNORMAL LOW (ref 135–145)
Total Protein: 5.8 g/dL — ABNORMAL LOW (ref 6.5–8.1)

## 2015-06-21 LAB — URINALYSIS, ROUTINE W REFLEX MICROSCOPIC
BILIRUBIN URINE: NEGATIVE
KETONES UR: NEGATIVE mg/dL
Leukocytes, UA: NEGATIVE
NITRITE: NEGATIVE
PH: 6 (ref 5.0–8.0)
Protein, ur: >300 mg/dL — AB
Specific Gravity, Urine: 1.015 (ref 1.005–1.030)

## 2015-06-21 LAB — CBC WITH DIFFERENTIAL/PLATELET
BASOS ABS: 0 10*3/uL (ref 0.0–0.1)
BASOS PCT: 0 %
Eosinophils Absolute: 0.1 10*3/uL (ref 0.0–0.7)
Eosinophils Relative: 1 %
HEMATOCRIT: 38.1 % (ref 36.0–46.0)
HEMOGLOBIN: 12.8 g/dL (ref 12.0–15.0)
LYMPHS PCT: 32 %
Lymphs Abs: 2 10*3/uL (ref 0.7–4.0)
MCH: 28.3 pg (ref 26.0–34.0)
MCHC: 33.6 g/dL (ref 30.0–36.0)
MCV: 84.3 fL (ref 78.0–100.0)
MONOS PCT: 5 %
Monocytes Absolute: 0.3 10*3/uL (ref 0.1–1.0)
NEUTROS ABS: 3.9 10*3/uL (ref 1.7–7.7)
NEUTROS PCT: 62 %
Platelets: 195 10*3/uL (ref 150–400)
RBC: 4.52 MIL/uL (ref 3.87–5.11)
RDW: 13.6 % (ref 11.5–15.5)
WBC: 6.2 10*3/uL (ref 4.0–10.5)

## 2015-06-21 LAB — CBG MONITORING, ED
GLUCOSE-CAPILLARY: 486 mg/dL — AB (ref 65–99)
GLUCOSE-CAPILLARY: 427 mg/dL — AB (ref 65–99)

## 2015-06-21 LAB — URINE MICROSCOPIC-ADD ON

## 2015-06-21 MED ORDER — SODIUM CHLORIDE 0.9 % IV BOLUS (SEPSIS)
2000.0000 mL | Freq: Once | INTRAVENOUS | Status: AC
  Administered 2015-06-21: 2000 mL via INTRAVENOUS

## 2015-06-21 MED ORDER — MECLIZINE HCL 25 MG PO TABS: 25.0000 mg | ORAL_TABLET | Freq: Three times a day (TID) | ORAL | Status: DC | PRN

## 2015-06-21 MED ORDER — MECLIZINE HCL 12.5 MG PO TABS
50.0000 mg | ORAL_TABLET | Freq: Once | ORAL | Status: AC
  Administered 2015-06-21: 50 mg via ORAL
  Filled 2015-06-21: qty 4

## 2015-06-21 MED ORDER — INSULIN ASPART 100 UNIT/ML ~~LOC~~ SOLN
10.0000 [IU] | Freq: Once | SUBCUTANEOUS | Status: AC
  Administered 2015-06-21: 10 [IU] via SUBCUTANEOUS
  Filled 2015-06-21: qty 1

## 2015-06-21 MED ORDER — METFORMIN HCL 500 MG PO TABS: 1000.0000 mg | ORAL_TABLET | Freq: Once | ORAL | Status: DC

## 2015-06-21 NOTE — ED Notes (Signed)
CRITICAL VALUE ALERT  Critical value received:  06/21/2015  Date of notification:  06/21/2015  Time of notification:  [id number]  Critical value read back:Yes.    Nurse who received alert:  Iona Coach  MD notified (1st page):  [phone]   Responding MD:  [email or web address]  Time MD responded:  [phone]

## 2015-06-21 NOTE — ED Notes (Signed)
Pt complain of dizziness that started last night. States she feels dizzy even lying down. States she has had inner ear problems in the past. Pt also complain of aching and being cold.

## 2015-06-21 NOTE — Discharge Instructions (Signed)
°Emergency Department Resource Guide °1) Find a Doctor and Pay Out of Pocket °Although you won't have to find out who is covered by your insurance plan, it is a good idea to ask around and get recommendations. You will then need to call the office and see if the doctor you have chosen will accept you as a new patient and what types of options they offer for patients who are self-pay. Some doctors offer discounts or will set up payment plans for their patients who do not have insurance, but you will need to ask so you aren't surprised when you get to your appointment. ° °2) Contact Your Local Health Department °Not all health departments have doctors that can see patients for sick visits, but many do, so it is worth a call to see if yours does. If you don't know where your local health department is, you can check in your phone book. The CDC also has a tool to help you locate your state's health department, and many state websites also have listings of all of their local health departments. ° °3) Find a Walk-in Clinic °If your illness is not likely to be very severe or complicated, you may want to try a walk in clinic. These are popping up all over the country in pharmacies, drugstores, and shopping centers. They're usually staffed by nurse practitioners or physician assistants that have been trained to treat common illnesses and complaints. They're usually fairly quick and inexpensive. However, if you have serious medical issues or chronic medical problems, these are probably not your best option. ° °No Primary Care Doctor: °- Call Health Connect at  832-8000 - they can help you locate a primary care doctor that  accepts your insurance, provides certain services, etc. °- Physician Referral Service- 1-800-533-3463 ° °Chronic Pain Problems: °Organization         Address  Phone   Notes  °[facility]  ([phone]) [phone] Patients need to be referred by their primary care doctor.  ° °Medication  Assistance: °Organization         Address  Phone   Notes  °[place] [facility] [address] °[place], [place] [address] (336) 641-8030 --Must be a resident of [place] °-- Must have NO insurance coverage whatsoever (no Medicaid/ Medicare, etc.) °-- The pt. MUST have a primary care doctor that directs their care regularly and follows them in the community °  °[facility]  (866) 331-1348   °[employer]  (888) 892-1162   ° °Agencies that provide inexpensive medical care: °Organization         Address  Phone   Notes  °[facility]  (336) 832-8035   °[facility]    (336) 832-7272   °[facility] [address] °[place], [place] [address] (336) 832-4777   °[facility] [address], °[place] (336) 271-4999   °[facility]    (336) 373-0678   °[facility]    (336) 272-1050   °[facility] ° [address],  Phone:  (336) 832-4444, Fax:  (336) 832-4440 Hours of Operation:  9 am - 6 pm, M-F.  Also accepts Medicaid/Medicare and self-pay.  °[facility] ° [address],  Phone: (336) 832-3150, Fax: (336) 832-3151. Hours of Operation:  8:30 am - 5:30 pm, M-F.  Also accepts Medicaid and self-pay.  °[facility] [place] [address], [place] Phone: (336) 878-6027   °[facility] [address], [place], [place] (336)723-1848, Ext. 123 Mondays & Thursdays: 7-9 AM.  First 15 patients are seen on a first come, first serve basis. °  ° °Medicaid-accepting [place] Providers: ° °Organization         Address  Phone   Notes  °[facility] [address], [place] (336) 641-2100 Also accepts self-pay patients.  °[facility] [address], [place] ° (336) 856-9996   °[facility] [address], [place]  (336) 288-8857   °[facility] [address] [place] [address], [place] (336) 299-7000   °Veita Bland [address], [place]  ° (336) 373-1557 Only accepts [place] Access Medicaid patients after they have their name applied to their card.  ° °Self-Pay (no insurance) in [place]: ° °Organization         Address  Phone   Notes  °Sickle Cell Patients, [facility] [address], [place] (336) 832-1970   °[facility] [address], [place] (336) 832-4400   °[facility] ° [address], [place] (336) 992-4800   °[facility]/Dr. Osei-Bonsu ° [address] [place] [address], [place] or [address], [place] (336) 841-8500 Phone number for both [place] and [place] locations is the same.  °[facility] [address], [place] (336) 299-0000   °[facility] [address] [place] [address], [place] or [address] (336) 852-7530 °(336) 878-2260   °[facility] [address], [place] (336) 350-1642, phone; (336) 294-5005, fax Sees patients 1st and 3rd Saturday of every month.  Must not qualify for public or private insurance (i.e. Medicaid, Medicare, [place] Health Choice, Veterans' Benefits) • Household income should be no more than 200% of the poverty level •The clinic cannot treat you if you are pregnant or think you are pregnant • Sexually transmitted diseases are not treated at the clinic.  ° ° °Dental Care: °Organization         Address  Phone  Notes  °[place] [facility] [address], [place] (336) 641-6152 Accepts children up to age 21 who are enrolled in Medicaid or [place] Health Choice; pregnant women with a Medicaid card; and children who have applied for Medicaid or [place] Health Choice, but were declined, whose parents can pay a reduced fee at time of service.  °[place]  [facility] [place]  [address], [place] (336) 641-7733 Accepts children up to age 21 who are enrolled in Medicaid or [place] Health Choice; pregnant women with a Medicaid card; and children who have applied for Medicaid or [place] Health Choice, but were declined, whose parents can pay a reduced fee at time of service.  °[facility] ° [address], [place] (336) 641-4533 Patients are seen by appointment only. Walk-ins are not accepted. [facility] will see patients 18 years of age and older. °Monday - Tuesday (8am-5pm) °Most Wednesdays (8:30-5pm) °$30 per visit, cash only  °[facility] ° [address], [place] (336) 641-4533 Patients are seen by appointment only. Walk-ins are not accepted. [facility] will see patients 18 years of age and older. °One   Wednesday Evening (Monthly: Volunteer Based).  $30 per visit, cash only  °[facility]  (919) 537-3737 for adults; Children under age 4, call Graduate Pediatric Dentistry at (919) [phone]. Children aged 4-14, please call (919) [phone] to request a pediatric application. ° Dental services are provided in all areas of dental care including fillings, crowns and bridges, complete and partial dentures, implants, gum treatment, root canals, and extractions. Preventive care is also provided. Treatment is provided to both adults and children. °Patients are selected via a lottery and there is often a waiting list. °  °[facility] [address], °[place] ° (336) 763-8833 www.drcivils.[email or web address] °  °[facility] [address], [place], [place] (336)723-1848, Ext. 123 Second and Fourth Thursday of each month, opens at 6:30 AM; Clinic ends at 9 AM.  Patients are seen on a first-come first-served basis, and a limited number are seen during each clinic.  ° °[facility] ° [address], [place], [place] (336) 723-7904    Eligibility Requirements °You must have lived in [place], [place], or [place] counties for at least the last three months. °  You cannot be eligible for state or federal sponsored healthcare insurance, including Veterans Administration, Medicaid, or Medicare. °  You generally cannot be eligible for healthcare insurance through your employer.  °  How to apply: °Eligibility screenings are held every Tuesday and Wednesday afternoon from 1:00 pm until 4:00 pm. You do not need an appointment for the interview!  °[facility] [address], [place], [place] 336-631-2330   °[facility]  336-342-8273   °[facility]  336-703-3100   °[facility]  336-570-6415   ° °Behavioral Health Resources in the Community: °Intensive Outpatient Programs °Organization         Address  Phone  Notes  °[facility] [address], [place], [place] 336-878-6098   °[facility] [address], [place], [place] 336-832-9800   °[facility] [address], [place], [place] ° 336-882-2125   °[facility] [address],  °[place], [place] 1-800-853-5163 or 336-641-4981   °Substance Abuse Resources °Organization         Address  Phone  Notes  °[facility]  336-882-2125   °[facility]  336-784-9470   °[facility]  336-285-9073   °[facility]  336-845-3988   °Residential & Outpatient Substance Abuse Program  1-800-659-3381   °Psychological Services °Organization         Address  Phone  Notes  °[facility]  336- 832-9600   °[facility]  336- 378-7881   °[facility] [address], [place] 1-800-853-5163 or 336-641-4981   ° °Mobile Crisis Teams °Organization         Address  Phone  Notes  °[facility], Mobile Crisis Care Unit  1-877-626-1772   °Assertive °[facility] ° [address]  [place], [place] 336-834-9664   °Sharon DeEsch [address] °[place] 336-554-5454   ° °Self-Help/Support Groups °Organization         Address  Phone             Notes  °[facility]  - variety of support groups  336- 373-1402 Call for more information  °Narcotics Anonymous (NA), [facility] [address], °[place]  2 meetings at this location  ° °  Residential Treatment Programs Organization         Address  Phone  Notes  [facility] [address],    [place]  1-740-217-9461   [facility]  [address], [place] [phone], [place], [place]   [place], [place] 707-334-4539 Admissions: 8am-3pm M-F  [facility] [address],    [place], [place] [phone]   [facility] [address] [place], [place], [place]   [facility] [address],  [place], [place]   [facility] Intensive Outpatient [address], [place], [place]   [facility]  [place], [place] 1-403-566-3742 or 609-015-2577   [facility] [address], [place], [place] Accepts Medicaid  [facility] [address],  [place] 1-956-198-1878 Substance Abuse/Addiction Treatment   [facility]         Address  Phone  Notes  [facility]  9121227017   Domenic Schwab, PhD [address] [place], [place]   616-590-3808 or 587-779-6668   [facility] [place], [place] 479-638-5530   [facility] [address] [place], [place] (559) 765-2845 Insurance/Medicaid/sponsorship through [facility] and Families [address] [place]                                    [place], [place] [phone] [place] [address], [place] [phone]    Dr. Adele Schilder  [phone]   [facility] Dept. 1) [address], [place] 2) [place] 3)  [address], [place] [phone]  [phone]   [place] 2695852126 or [phone] (After Hours)      Take the prescription as directed. Take your insulin and metformin (our Pharmacist says if this is not the "ER" formulation, you may crush this tablet to take it) as prescribed. Follow your diabetic diet closely. Call your regular medical doctor tomorrow to schedule a follow up appointment next week.  Return to the Emergency Department immediately sooner if worsening.

## 2015-06-21 NOTE — ED Notes (Signed)
Pt reports feeling dizzy and lightheaded since last night around 11pm.  Pt says has been staggering around, unsteady on feet.  Pt also c/o headache and pain in left arm since 8am.  Denies injury to left arm but noticed a bruise to palm of left hand.

## 2015-06-21 NOTE — ED Provider Notes (Signed)
CSN: [id number]     Arrival date & time 06/21/15  1517 History   First MD Initiated Contact with Patient 06/21/15 1531     Chief Complaint  Patient presents with  . Dizziness      HPI Pt was seen at 1535. Per pt, c/o sudden onset and persistence of constant "dizziness" that began last night. Pt describes the dizziness as "everything is moving around." Symptoms worsen with turning her head or eyes side-to-side, as well as when she ambulates. Denies CP/palpitations, no SOB/cough, no abd pain, no N/V/D, no fevers, no rash, no visual changes, no focal motor weakness, no tingling/numbness in extremities, no slurred speech, no facial droop. Pt also c/o continued left sided neck "pain" for the past 2 weeks. Describes the pain as "aching" and "spasms." Pt was evaluated in the ED 1 week ago for this, dx cervical radiculopathy. Pt denies any change in her symptoms. Denies injury.     Past Medical History  Diagnosis Date  . Diabetes mellitus   . Hypertension   . RLS (restless legs syndrome)   . Acid reflux   . Bipolar 1 disorder ([place])   . Depression   . OSA (obstructive sleep apnea)     cpap  . Anemia   . Arthritis   . Degenerative disc disease, thoracic   . Shortness of breath   . Obesity (BMI 30-39.9)   . COPD (chronic obstructive pulmonary disease) ([place]) 2014    bronchitis  . Headache   . Cervical radiculopathy   . Noncompliance with medication regimen    Past Surgical History  Procedure Laterality Date  . Tubal ligation    . Endometrial ablation    . Colonoscopy  06/06/2010    [id number] bleeding secondary to internal hemorrhoids but incomplete evaluation secondary to poor right colon prep/small rectal and sigmoid colon polyps (hyperplastic). PROPOFOL  . Esophagogastroduodenoscopy  06/06/2010    [id number] erythema and edema of body of stomach, with sessile polypoid lesions. bx benign. no h.pylori  . Colonoscopy  May 2013    Dr. Gilliam/[facility]: 5 mm a descending colon polyp,  hyperplastic. Adequate bowel prep.  . Flexible sigmoidoscopy N/A 05/17/2013    Dr. Oneida Alar: moderate sized internal hemorrhoids  . Esophagogastroduodenoscopy (egd) with propofol N/A 05/17/2013    Dr. Oneida Alar: normal esophagus, moderate nodular gastritis, negative path, empiric Savary dilation  . Savory dilation N/A 05/17/2013    Procedure: SAVORY DILATION;  Surgeon: [name], MD;  Location: [facility];  Service: Endoscopy;  Laterality: N/A;  14/15/16  . Hemorrhoid banding N/A 05/17/2013    Procedure: HEMORRHOID BANDING;  Surgeon: [name], MD;  Location: [facility];  Service: Endoscopy;  Laterality: N/A;  2 bands placed  . Esophageal biopsy N/A 05/17/2013    Procedure: BIOPSY;  Surgeon: [name], MD;  Location: [facility];  Service: Endoscopy;  Laterality: N/A;  . Irrigation and debridement abscess Right 06/01/2013    Procedure: INCISION AND DRAINAGE AND DEBRIDEMENT ABSCESS RIGHT BREAST;  Surgeon: [name], MD;  Location: [facility];  Service: General;  Laterality: Right;  . Incision and drainage abscess Left 10/11/2013    Procedure: INCISION AND DRAINAGE AND DEBRIDEMENT LEFT BREAST  ABSCESS;  Surgeon: [name], MD;  Location: [facility];  Service: General;  Laterality: Left;  . Cholecystectomy N/A 04/27/2014    Procedure: LAPAROSCOPIC CHOLECYSTECTOMY;  Surgeon: [name], MD;  Location: [facility];  Service: General;  Laterality: N/A;   Family History  Problem Relation Age  of Onset  . Adopted: Yes  . Family history unknown: Yes   Social History  Substance Use Topics  . Smoking status: Former Smoker -- 1.50 packs/day for 25 years    Types: Cigarettes    Quit date: 10/23/2007  . Smokeless tobacco: Former Systems developer     Comment: Quit x 6 years ago  . Alcohol Use: No   OB History    Gravida Para Term Preterm AB TAB SAB Ectopic Multiple Living   2 2 1 1      2      Review of Systems ROS: Statement: All systems negative except as marked or noted in the HPI; Constitutional:  Negative for fever and chills. ; ; Eyes: Negative for eye pain, redness and discharge. ; ; ENMT: Negative for ear pain, hoarseness, nasal congestion, sinus pressure and sore throat. ; ; Cardiovascular: Negative for chest pain, palpitations, diaphoresis, dyspnea and peripheral edema. ; ; Respiratory: Negative for cough, wheezing and stridor. ; ; Gastrointestinal: Negative for nausea, vomiting, diarrhea, abdominal pain, blood in stool, hematemesis, jaundice and rectal bleeding. . ; ; Genitourinary: Negative for dysuria, flank pain and hematuria. ; ; Musculoskeletal: +left neck pain. Negative for back pain. Negative for swelling and trauma.; ; Skin: Negative for pruritus, rash, abrasions, blisters, bruising and skin lesion.; ; Neuro: +"dizziness." Negative for headache, lightheadedness and neck stiffness. Negative for weakness, altered level of consciousness , altered mental status, extremity weakness, paresthesias, involuntary movement, seizure and syncope.      Allergies  Codeine  Home Medications   Prior to Admission medications   Medication Sig Start Date End Date Taking? Authorizing Provider  albuterol (PROVENTIL) (2.5 MG/3ML) 0.083% nebulizer solution Take 2.5 mg by nebulization 4 (four) times daily.     Historical Provider, MD  ALPRAZolam Duanne Moron) 1 MG tablet Take 1 mg by mouth 4 (four) times daily.      Historical Provider, MD  amLODipine (NORVASC) 10 MG tablet Take 10 mg by mouth daily.    Historical Provider, MD  calcium carbonate (TUMS - DOSED IN MG ELEMENTAL CALCIUM) 500 MG chewable tablet Chew 2 tablets by mouth daily as needed for indigestion or heartburn.    Historical Provider, MD  celecoxib (CELEBREX) 100 MG capsule Take 1 capsule (100 mg total) by mouth 2 (two) times daily. 07/23/14   Lily Kocher, PA-C  diclofenac (VOLTAREN) 75 MG EC tablet Take 1 tablet (75 mg total) by mouth 2 (two) times daily with a meal. 10/17/14   Carole Civil, MD  dicyclomine (BENTYL) 10 MG capsule Take 1  capsule (10 mg total) by mouth 4 (four) times daily -  before meals and at bedtime. 02/09/14   Orvil Feil, NP  DULoxetine (CYMBALTA) 60 MG capsule Take 60 mg by mouth daily.    Historical Provider, MD  esomeprazole (NEXIUM) 40 MG capsule Take 40 mg by mouth every morning.    Historical Provider, MD  gabapentin (NEURONTIN) 300 MG capsule Take 300 mg by mouth 3 (three) times daily.    Historical Provider, MD  HYDROcodone-acetaminophen (NORCO) 5-325 MG per tablet Take 1 tablet by mouth every 4 (four) hours as needed for moderate pain. [id number]   Delora Fuel, MD  ibuprofen (ADVIL,MOTRIN) 800 MG tablet Take 1 tablet (800 mg total) by mouth 3 (three) times daily. 06/10/15   Noemi Chapel, MD  insulin aspart protamine- aspart (NOVOLOG MIX 70/30) (70-30) 100 UNIT/ML injection Inject 1-25 Units into the skin 2 (two) times daily.    Historical Provider, MD  insulin glargine (LANTUS) 100 UNIT/ML injection Inject 65 Units into the skin 2 (two) times daily.    Historical Provider, MD  lisinopril-hydrochlorothiazide (PRINZIDE,ZESTORETIC) 20-12.5 MG per tablet Take 1 tablet by mouth 2 (two) times daily.     Historical Provider, MD  meloxicam (MOBIC) 15 MG tablet Take 15 mg by mouth daily.    Historical Provider, MD  metFORMIN (GLUCOPHAGE) 1000 MG tablet Take 1,000 mg by mouth 2 (two) times daily with a meal.    Historical Provider, MD  rOPINIRole (REQUIP) 1 MG tablet Take 1 mg by mouth at bedtime.     Historical Provider, MD  simvastatin (ZOCOR) 40 MG tablet Take 40 mg by mouth every evening.    Historical Provider, MD   BP 161/92 mmHg  Pulse 96  Temp(Src) 98.1 F (36.7 C) (Oral)  Resp 16  Ht 5\' 8"  (1.727 m)  Wt 350 lb (158.759 kg)  BMI 53.23 kg/m2  SpO2 100%   16:18:18 Vital Signs JY  Vital Signs - Pulse Rate: 84 ; Resp: 15 ; BP: 150/89 mmHg ; BP Location: Right Arm ; BP Method: Automatic ; Patient Position (if appropriate): Lying  Oxygen Therapy - SpO2: 100 % ; O2 Device: Room Air      16:18:18 Vital  Signs JY  Vitals Assessment - Automatic Restart Vitals Timer: Yes      16:19:25 Vital Signs JY  Vital Signs - Pulse Rate: 83 ; Pulse Rate Source: Monitor ; Resp: 14 ; BP: 148/89 mmHg ; BP Location: Right Arm ; BP Method: Automatic ; Patient Position (if appropriate): Sitting  Oxygen Therapy - SpO2: 100 %      16:19:25 Vital Signs JY  Vitals Assessment - Automatic Restart Vitals Timer: Yes      16:20:31 Vital Signs JY  Vital Signs - Pulse Rate: 103 ; Bedside Cardiac Monitor On: Yes ; Resp: 22 ; BP: 153/92 mmHg ; BP Location: Right Arm ; Patient Position (if appropriate): Standing  Oxygen Therapy - SpO2: 100 % ; O2 Device: Room Air       Physical Exam  1540: Physical examination:  Nursing notes reviewed; Vital signs and O2 SAT reviewed;  Constitutional: Well developed, Well nourished, Well hydrated, In no acute distress; Head:  Normocephalic, atraumatic; Eyes: EOMI, PERRL, No scleral icterus; ENMT: TM's clear bilat. +edemetous nasal turbinates bilat with clear rhinorrhea. Mouth and pharynx normal, Mucous membranes moist; Neck: Supple, Full range of motion, No lymphadenopathy; Cardiovascular: Regular rate and rhythm, No murmur, rub, or gallop; Respiratory: Breath sounds clear & equal bilaterally, No rales, rhonchi, wheezes.  Speaking full sentences with ease, Normal respiratory effort/excursion; Chest: Nontender, Movement normal; Abdomen: Soft, Nontender, Nondistended, Normal bowel sounds; Genitourinary: No CVA tenderness; Spine:  No midline CS, TS, LS tenderness. +TTP left hypertonic trapezius muscle. No rash.;; Extremities: Pulses normal, No tenderness, No edema, No calf edema or asymmetry.; Neuro: AA&Ox3, Major CN grossly intact. Speech clear.  No facial droop.  +left horizontal end gaze fatigable nystagmus which reproduces pt's symptoms. Grips equal. Strength 5/5 equal bilat UE's and LE's.  DTR 2/4 equal bilat UE's and LE's.  No gross sensory deficits.  Normal cerebellar testing bilat UE's  (finger-nose) and LE's (heel-shin).; Skin: Color normal, Warm, Dry.   ED Course  Procedures (including critical care time) Labs Review   Imaging Review  I have personally reviewed and evaluated these images and lab results as part of my medical decision-making.   EKG Interpretation   Date/Time:  Thursday June 21 2015 15:32:44 EST  Ventricular Rate:  82 PR Interval:  138 QRS Duration: 68 QT Interval:  369 QTC Calculation: 431 R Axis:   28 Text Interpretation:  Sinus rhythm Low voltage, precordial leads Poor R  wave progression When compared with ECG of 04/26/2014 No significant change  was found Confirmed by [facility]  MD, Nunzio Cory [id number]) on 06/21/2015 4:10:58  PM      MDM  MDM Reviewed: previous chart, nursing note and vitals Reviewed previous: labs and ECG Interpretation: labs, ECG and CT scan   Results for orders placed or performed during the hospital encounter of 06/21/15  Pregnancy, urine  Result Value Ref Range   Preg Test, Ur NEGATIVE NEGATIVE  Urinalysis, Routine w reflex microscopic  Result Value Ref Range   Color, Urine YELLOW YELLOW   APPearance HAZY (A) CLEAR   Specific Gravity, Urine 1.015 1.005 - 1.030   pH 6.0 5.0 - 8.0   Glucose, UA >1000 (A) NEGATIVE mg/dL   Hgb urine dipstick SMALL (A) NEGATIVE   Bilirubin Urine NEGATIVE NEGATIVE   Ketones, ur NEGATIVE NEGATIVE mg/dL   Protein, ur >300 (A) NEGATIVE mg/dL   Nitrite NEGATIVE NEGATIVE   Leukocytes, UA NEGATIVE NEGATIVE  Urine microscopic-add on  Result Value Ref Range   Squamous Epithelial / LPF TOO NUMEROUS TO COUNT (A) NONE SEEN   WBC, UA 0-5 0 - 5 WBC/hpf   RBC / HPF 0-5 0 - 5 RBC/hpf   Bacteria, UA FEW (A) NONE SEEN   Urine-Other YEAST PRESENT   Blood gas, arterial  Result Value Ref Range   FIO2 0.21    pH, Arterial 7.364 7.350 - 7.450   pCO2 arterial 37.1 35.0 - 45.0 mmHg   pO2, Arterial 82.1 80.0 - 100.0 mmHg   Bicarbonate 21.3 20.0 - 24.0 mEq/L   Acid-base deficit 3.8 (H) 0.0 -  2.0 mmol/L   O2 Saturation 95.8 %   Collection site LEFT BRACHIAL    Drawn by COLLECTED BY RT    Sample type ARTERIAL    Allens test (pass/fail) NOT INDICATED (A) PASS  CBC with Differential  Result Value Ref Range   WBC 6.2 4.0 - 10.5 K/uL   RBC 4.52 3.87 - 5.11 MIL/uL   Hemoglobin 12.8 12.0 - 15.0 g/dL   HCT 38.1 36.0 - 46.0 %   MCV 84.3 78.0 - 100.0 fL   MCH 28.3 26.0 - 34.0 pg   MCHC 33.6 30.0 - 36.0 g/dL   RDW 13.6 11.5 - 15.5 %   Platelets 195 150 - 400 K/uL   Neutrophils Relative % 62 %   Neutro Abs 3.9 1.7 - 7.7 K/uL   Lymphocytes Relative 32 %   Lymphs Abs 2.0 0.7 - 4.0 K/uL   Monocytes Relative 5 %   Monocytes Absolute 0.3 0.1 - 1.0 K/uL   Eosinophils Relative 1 %   Eosinophils Absolute 0.1 0.0 - 0.7 K/uL   Basophils Relative 0 %   Basophils Absolute 0.0 0.0 - 0.1 K/uL  Comprehensive metabolic panel  Result Value Ref Range   Sodium 129 (L) 135 - 145 mmol/L   Potassium 4.0 3.5 - 5.1 mmol/L   Chloride 95 (L) 101 - 111 mmol/L   CO2 25 22 - 32 mmol/L   Glucose, Bld 683 (HH) 65 - 99 mg/dL   BUN 32 (H) 6 - 20 mg/dL   Creatinine, Ser 2.28 (H) 0.44 - 1.00 mg/dL   Calcium 8.4 (L) 8.9 - 10.3 mg/dL   Total Protein 5.8 (L) 6.5 - 8.1  g/dL   Albumin 2.3 (L) 3.5 - 5.0 g/dL   AST 10 (L) 15 - 41 U/L   ALT 15 14 - 54 U/L   Alkaline Phosphatase 93 38 - 126 U/L   Total Bilirubin 0.5 0.3 - 1.2 mg/dL   GFR calc non Af Amer 24 (L) >60 mL/min   GFR calc Af Amer 28 (L) >60 mL/min   Anion gap 9 5 - 15  I-stat Chem 8, ED  Result Value Ref Range   Sodium 130 (L) 135 - 145 mmol/L   Potassium 4.1 3.5 - 5.1 mmol/L   Chloride 98 (L) 101 - 111 mmol/L   BUN 29 (H) 6 - 20 mg/dL   Creatinine, Ser 2.20 (H) 0.44 - 1.00 mg/dL   Glucose, Bld 671 (HH) 65 - 99 mg/dL   Calcium, Ion 1.15 1.12 - 1.23 mmol/L   TCO2 21 0 - 100 mmol/L   Hemoglobin 13.6 12.0 - 15.0 g/dL   HCT 40.0 36.0 - 46.0 %  CBG monitoring, ED  Result Value Ref Range   Glucose-Capillary 486 (H) 65 - 99 mg/dL   Comment 1  Document in Chart   CBG monitoring, ED  Result Value Ref Range   Glucose-Capillary 427 (H) 65 - 99 mg/dL   Results for Martha, [name] (MRN [id number]) as of 06/21/2015 17:07  Ref. Range 04/26/2014 12:12 04/27/2014 06:21 04/28/2014 06:31 12/22/2014 11:10 01/07/2015 00:20 01/07/2015 03:00 03/17/2015 22:30 06/21/2015 16:01  BUN Latest Ref Range: 6-20 mg/dL 32 (H) 29 (H) 19 30 (H) 43 (H) 39 (H) 27 (H) 29 (H)  Creatinine Latest Ref Range: 0.44-1.00 mg/dL 3.43 (H) 3.08 (H) 2.20 (H) 1.96 (H) 2.18 (H) 1.98 (H) 1.89 (H) 2.20 (H)    1700:   Hyperglycemic on I-stat chem, not acidotic with AG 11. Pt questioned regarding her insulin and metformin compliance. Pt states she took both of her insulins today, but "then went to [employer] and ate dinner, then had some cotton candy" immediately before arrival to the ED.  Pt also admits that she does not take her metformin "because I can't swallow them" and "they make me gag." [facility] Pharmacist called: states OK to crush if not ER form. This information relayed to pt who verb understanding. Pt has had multiple ED visits for hyperglycemia. ED RN and I strongly encouraged pt to comply with her DM diet and all DM medications, as well as regularly f/u with her PMD, for good continuity of care and control of her chronic medical condition. Pt verb understanding. Will dose IVF and SQ insulin.   2010:  IVF and SQ insulin given with CBG trending downward. Pt states her home CBG's "run in the 400's," she "feels better now" and wants to go home. Pt is due to take her metformin, as well as lantus and 70/30 insulins. Pt again encouraged by ED RN and myself to take her meds as rx and follow her DM diet; pt verb understanding. Will tx vertigo symptomatically at this time. Dx and testing d/w pt and family.  Questions answered.  Verb understanding, agreeable to d/c home with outpt f/u.   Francine Graven, DO 06/25/15 1358

## 2015-06-21 NOTE — ED Notes (Signed)
Pt complain of dizziness when standing

## 2015-06-21 NOTE — ED Notes (Signed)
Pt in restroom 

## 2015-06-26 ENCOUNTER — Encounter (HOSPITAL_COMMUNITY): Payer: Self-pay | Admitting: Occupational Therapy

## 2015-06-26 ENCOUNTER — Ambulatory Visit (HOSPITAL_COMMUNITY): Payer: Medicaid Other | Attending: Pulmonary Disease | Admitting: Occupational Therapy

## 2015-06-26 DIAGNOSIS — M25612 Stiffness of left shoulder, not elsewhere classified: Secondary | ICD-10-CM

## 2015-06-26 DIAGNOSIS — M629 Disorder of muscle, unspecified: Secondary | ICD-10-CM | POA: Diagnosis present

## 2015-06-26 DIAGNOSIS — M792 Neuralgia and neuritis, unspecified: Secondary | ICD-10-CM | POA: Insufficient documentation

## 2015-06-26 DIAGNOSIS — M6281 Muscle weakness (generalized): Secondary | ICD-10-CM | POA: Insufficient documentation

## 2015-06-26 DIAGNOSIS — M6289 Other specified disorders of muscle: Secondary | ICD-10-CM

## 2015-06-26 NOTE — Therapy (Signed)
[name], [place], [address] Phone: [phone]   Fax:  [phone]  Occupational Therapy Evaluation  Patient Details  Name: Martha [name] MRN: [id number] Date of Birth: 1966-01-25 Referring Provider: Dr. Luan Pulling  Encounter Date: 06/26/2015      OT End of Session - 06/26/15 1537    Visit Number 1   Number of Visits 1   Date for OT Re-Evaluation 08/25/15   Authorization Type Medicaid   OT Start Time 1426   OT Stop Time 1525   OT Time Calculation (min) 59 min   Activity Tolerance Patient tolerated treatment well   Behavior During Therapy [name] for tasks assessed/performed      Past Medical History  Diagnosis Date  . Diabetes mellitus   . Hypertension   . RLS (restless legs syndrome)   . Acid reflux   . Bipolar 1 disorder ([place])   . Depression   . OSA (obstructive sleep apnea)     cpap  . Anemia   . Arthritis   . Degenerative disc disease, thoracic   . Shortness of breath   . Obesity (BMI 30-39.9)   . COPD (chronic obstructive pulmonary disease) ([place]) 2014    bronchitis  . Headache   . Cervical radiculopathy   . Noncompliance with medication regimen     Past Surgical History  Procedure Laterality Date  . Tubal ligation    . Endometrial ablation    . Colonoscopy  06/06/2010    [id number] bleeding secondary to internal hemorrhoids but incomplete evaluation secondary to poor right colon prep/small rectal and sigmoid colon polyps (hyperplastic). PROPOFOL  . Esophagogastroduodenoscopy  06/06/2010    [id number] erythema and edema of body of stomach, with sessile polypoid lesions. bx benign. no h.pylori  . Colonoscopy  May 2013    Dr. Gilliam/[facility]: 5 mm a descending colon polyp, hyperplastic. Adequate bowel prep.  . Flexible sigmoidoscopy N/A 05/17/2013    Dr. Oneida Alar: moderate sized internal hemorrhoids  . Esophagogastroduodenoscopy (egd) with propofol N/A 05/17/2013    Dr. Oneida Alar: normal esophagus,  moderate nodular gastritis, negative path, empiric Savary dilation  . Savory dilation N/A 05/17/2013    Procedure: SAVORY DILATION;  Surgeon: [name], MD;  Location: [facility];  Service: Endoscopy;  Laterality: N/A;  14/15/16  . Hemorrhoid banding N/A 05/17/2013    Procedure: HEMORRHOID BANDING;  Surgeon: [name], MD;  Location: [facility];  Service: Endoscopy;  Laterality: N/A;  2 bands placed  . Esophageal biopsy N/A 05/17/2013    Procedure: BIOPSY;  Surgeon: [name], MD;  Location: [facility];  Service: Endoscopy;  Laterality: N/A;  . Irrigation and debridement abscess Right 06/01/2013    Procedure: INCISION AND DRAINAGE AND DEBRIDEMENT ABSCESS RIGHT BREAST;  Surgeon: [name], MD;  Location: [facility];  Service: General;  Laterality: Right;  . Incision and drainage abscess Left 10/11/2013    Procedure: INCISION AND DRAINAGE AND DEBRIDEMENT LEFT BREAST  ABSCESS;  Surgeon: [name], MD;  Location: [facility];  Service: General;  Laterality: Left;  . Cholecystectomy N/A 04/27/2014    Procedure: LAPAROSCOPIC CHOLECYSTECTOMY;  Surgeon: [name], MD;  Location: [facility];  Service: General;  Laterality: N/A;    There were no vitals filed for this visit.  Visit Diagnosis:  Radicular pain in left arm  Tight fascia  Muscle weakness of left arm  Stiffness of left shoulder joint      Subjective Assessment - 06/26/15 1532  Subjective  S: I just can't do anything with this arm, it hurts so bad.    Pertinent History Pt is a 49 y/o female presenting with left arm radicular pain from a suspected pinched nerve in her cervical spine. Pt reports nothing helps with the pain including medications, ice, and heat, and this is the worst pain she has ever had. Dr. Luan Pulling referred pt to occupational therapy for evaluation and treatment.    Patient Stated Goals to get rid of the pain   Currently in Pain? Yes   Pain Score 10-Worst pain ever   Pain Location Shoulder   Pain  Orientation Left   Pain Descriptors / Indicators Aching;Constant   Pain Type Acute pain           [facility] OT Assessment - 06/26/15 1431    Assessment   Diagnosis left arm radicular pain   Referring Provider Dr. Luan Pulling   Onset Date 05/29/15   Prior Therapy None   Precautions   Precautions None   Balance Screen   Has the patient fallen in the past 6 months No   Has the patient had a decrease in activity level because of a fear of falling?  No   Is the patient reluctant to leave their home because of a fear of falling?  No   Home  Environment   Family/patient expects to be discharged to: Private residence   Prior Function   Level of Independence Independent with basic ADLs   Vocation On disability   Leisure crochet   ADL   ADL comments Pt is having difficulty with dressing, bathing tasks, grooming tasks, reaching into overhead cabinets, grasping and lifting any weighted objects.    Written Expression   Dominant Hand Right   Cognition   Overall Cognitive Status Within Functional Limits for tasks assessed   ROM / Strength   AROM / PROM / Strength AROM;PROM;Strength   Palpation   Palpation comment Pt with moderate fascial restrictions in left upper arm, trapezius, and scapularis regions.    AROM   Overall AROM Comments Assessed seated, ER/IR adducted   AROM Assessment Site Shoulder   Right/Left Shoulder Left   Left Shoulder Flexion 90 Degrees   Left Shoulder ABduction 69 Degrees   Left Shoulder Internal Rotation 65 Degrees   Left Shoulder External Rotation 35 Degrees   PROM   Overall PROM Comments Assessed seated, ER/IR adducted  Pt reports inability to lay supine due to back pain   PROM Assessment Site Shoulder   Right/Left Shoulder Left   Left Shoulder Flexion 91 Degrees   Left Shoulder ABduction 100 Degrees   Left Shoulder Internal Rotation 65 Degrees   Left Shoulder External Rotation 50 Degrees   Strength   Overall Strength Comments Assessed seated, ER/IR adducted    Strength Assessment Site Shoulder;Hand   Right/Left Shoulder Left   Left Shoulder Flexion 3/5   Left Shoulder ABduction 3/5   Left Shoulder Internal Rotation 3/5   Left Shoulder External Rotation 3/5   Right/Left hand Right;Left   Right Hand Gross Grasp Functional   Right Hand Grip (lbs) 50   Left Hand Gross Grasp Functional   Left Hand Grip (lbs) 12                         OT Education - 06/26/15 1536    Education provided Yes   Education Details Comprehensive HEP: table slides, seated A/ROM, AA/ROM, A/ROM, scapular theraband, shoulder stretches; theraputty  for grip strengthening. Educated pt on a graduated timeline for exercises. Also educated on self-myofascial release using tennis ball.    Person(s) Educated Patient   Methods Explanation;Demonstration;Handout   Comprehension Verbalized understanding;Returned demonstration          OT Short Term Goals - 06/26/15 1542    OT SHORT TERM GOAL #1   Title Pt will be educated on and independent in HEP.    Time 1   Period Days   Status Achieved                  Plan - 06/26/15 1537    Clinical Impression Statement A: Pt is a 49 y/o female presenting with increased pain and fascial restrictions, decreased range of motion, strength, and functional use of LUE limiting her ability to perform ADL tasks at her highest level of functioning. Pt reports pain as a 10/10 or greater; however during the evaluation she was able to move arm fluidly below shoulder level without signs of pain. Pt has Medicaid and does not qualify for treatment visits, self-pay is not an option. Pt was educated on a comprehensive HEP including shoulder and grip strengthening exercises; instructed with dates for graduated exercises.    Pt will benefit from skilled therapeutic intervention in order to improve on the following deficits (Retired) Impaired UE functional use   Rehab Potential Good   OT Frequency 1x / week   OT Duration --  1  visit   OT Treatment/Interventions Patient/family education   Plan P: Educated pt on comprehensive, graduated HEP.    OT Home Exercise Plan table slides, shoulder exercises, scapular stability exercises, theraputty for grip strengthening.    Consulted and Agree with Plan of Care Patient        Problem List Patient Active Problem List   Diagnosis Date Noted  . Abdominal pain 04/26/2014  . Cholelithiasis 04/26/2014  . Acute renal failure ([place]) 04/26/2014  . Dehydration 04/26/2014  . Hyponatremia 04/26/2014  . Gallstones 04/06/2014  . Uncontrolled diabetes mellitus ([place]) 10/09/2013  . Breast abscess 05/30/2013  . Diabetes mellitus, type 2 ([place]) 05/30/2013  . Hyperglycemia 05/30/2013  . Internal hemorrhoids with other complication [id number]  . Umbilical hernia without mention of obstruction or gangrene 04/15/2013  . Anemia 04/15/2013  . DM 05/13/2010  . HYPERLIPIDEMIA 05/13/2010  . OBESITY 05/13/2010  . DEPRESSION 05/13/2010  . RESTLESS LEG SYNDROME 05/13/2010  . HYPERTENSION 05/13/2010  . GERD 05/13/2010  . RECTAL BLEEDING 05/13/2010  . SLEEP APNEA 05/13/2010  . Dysphagia, oropharyngeal phase 05/13/2010    Guadelupe Sabin, OTR/L  [phone]  06/26/2015, 3:47 PM  [facility] [address] [place], [place], [address] Phone: [phone]   Fax:  [phone]  Name: DONINIQUE PAMPLONA MRN: [id number] Date of Birth: 1965-09-21

## 2015-06-26 NOTE — Patient Instructions (Addendum)
SHOULDER: Flexion On Table   Place hands on table, elbows straight. Move hips away from body. Press hands down into table.  _10-15__ reps per set, __2_ sets per day  Abduction (Passive)   With arm out to side, resting on table, lower head toward arm, keeping trunk away from table.  Repeat __10-15__ times. Do _2___ sessions per day.  Copyright  VHI. All rights reserved.     Internal Rotation (Assistive)   Seated with elbow bent at right angle and held against side, slide arm on table surface in an inward arc. Repeat _10-15___ times. Do __2__ sessions per day. Activity: Use this motion to brush crumbs off the table.  Copyright  VHI. All rights reserved.     Scapular Active Range of Motion Exercises 1) Seated Row   Sit up straight with elbows by your sides. Pull back with shoulders/elbows, keeping forearms straight, as if pulling back on the reins of a horse. Squeeze shoulder blades together. Repeat _10-15__times, __2__sets/day    2) Shoulder Elevation    Sit up straight with arms by your sides. Slowly bring your shoulders up towards your ears. Repeat_10-15__times, __2__ sets/day    3) Shoulder Extension    Sit up straight with both arms by your side, draw your arms back behind your waist. Keep your elbows straight. Repeat __10-15__times, __2__sets/day.       Active-assisted Range of Motion Exercises Perform each exercise __10-15______ reps. 2-3x days.   Protraction - STANDING  Start by holding a wand or cane at chest height.  Next, slowly push the wand outwards in front of your body so that your elbows become fully straightened. Then, return to the original position.     Shoulder FLEXION - STANDING - PALMS UP  In the standing position, hold a wand/cane with both arms, palms up on both sides. Raise up the wand/cane allowing your unaffected arm to perform most of the effort. Your affected arm should be partially relaxed.      Internal/External  ROTATION - STANDING  In the standing position, hold a wand/cane with both hands keeping your elbows bent. Move your arms and wand/cane to one side.  Your affected arm should be partially relaxed while your unaffected arm performs most of the effort.       Shoulder ABDUCTION - STANDING  While holding a wand/cane palm face up on the injured side and palm face down on the uninjured side, slowly raise up your injured arm to the side.       Horizontal Abduction/Adduction      Straight arms holding cane at shoulder height, bring cane to right, center, left. Repeat starting to left.   Copyright  VHI. All rights reserved.          Active Range of Motion Exercises Complete each exercise 10-15X, 2x/day   1) Shoulder Protraction    Begin with elbows by your side, slowly "punch" straight out in front of you keeping arms/elbows straight.     2) Shoulder Flexion  Supine:     Standing:         Begin with arms at your side with thumbs pointed up, slowly raise both arms up and forward towards overhead.     3) Horizontal abduction/adduction  Supine:   Standing:           Begin with arms straight out in front of you, bring out to the side in at "T" shape. Keep arms straight entire time.    4) Internal &  External Rotation    *No band* -Stand with elbows at the side and elbows bent 90 degrees. Move your forearms away from your body, then bring back inward toward the body.     5) Shoulder Abduction  Supine:     Standing:       Lying on your back begin with your arms flat on the table next to your side. Slowly move your arms out to the side so that they go overhead, in a jumping jack or snow angel movement.       Scapular Theraband  (Home) Extension: Isometric / Bilateral Arm Retraction - Sitting   Facing anchor, hold hands and elbow at shoulder height, with elbow bent.  Pull arms back to squeeze shoulder blades together. Repeat 10-15  times.  Copyright  VHI. All rights reserved.   (Home) Retraction: Row - Bilateral (Anchor)   Facing anchor, arms reaching forward, pull hands toward stomach, keeping elbows bent and at your sides and pinching shoulder blades together. Repeat 10-15 times.  Copyright  VHI. All rights reserved.   (Clinic) Extension / Flexion (Assist)   Face anchor, pull arms back, keeping elbow straight, and squeze shoulder blades together. Repeat 10-15 times.   Copyright  VHI. All rights reserved.       Shoulder Stretches  1) Flexion Wall Stretch    Face wall, place affected handon wall in front of you. Slide hand up the wall  and lean body in towards the wall. Hold for 5 seconds. Repeat 10 times. 1-2 times/day.     2) Towel Stretch with Internal Rotation   Gently pull up your affected arm  behind your back with the assist of a towel     3) Corner Stretch    Stand at a corner of a wall, place your arms on the walls with elbows bent. Lean into the corner until a stretch is felt along the front of your chest and/or shoulders. Hold for 5 seconds. Repeat 10X, 1-2 times/day.    4) Posterior Capsule Stretch    Bring the involved arm across chest. Grasp elbow and pull toward chest until you feel a stretch in the back of the upper arm and shoulder. Hold 5 seconds. Repeat 10X. Complete 1-2 times/day.    5) Scapular Retraction    Tuck chin back as you pinch shoulder blades together.  Hold 5 seconds. Repeat 10X. Complete 1-2 times/day.             Home Exercises Program Theraputty Exercises  Do the following exercises 2 times a day using your affected hand.  1. Roll putty into a ball.  2. Make into a pancake.  3. Roll putty into a roll.  4. Pinch along log with first finger and thumb.   5. Make into a ball.  6. Roll it back into a log.   7. Pinch using thumb and side of first finger.  8. Roll into a ball, then flatten into a pancake.  9. Using your  fingers, make putty into a mountain.

## 2015-08-17 ENCOUNTER — Other Ambulatory Visit (HOSPITAL_COMMUNITY): Payer: Self-pay | Admitting: Pulmonary Disease

## 2015-08-17 DIAGNOSIS — M5412 Radiculopathy, cervical region: Secondary | ICD-10-CM

## 2015-08-31 ENCOUNTER — Ambulatory Visit (HOSPITAL_COMMUNITY): Admission: RE | Admit: 2015-08-31 | Payer: Medicaid Other | Source: Ambulatory Visit

## 2015-09-04 ENCOUNTER — Encounter (HOSPITAL_COMMUNITY): Payer: Self-pay

## 2015-09-04 ENCOUNTER — Emergency Department (HOSPITAL_COMMUNITY)
Admission: EM | Admit: 2015-09-04 | Discharge: 2015-09-04 | Disposition: A | Discharge status: Home / Self Care | Payer: Medicaid Other | Attending: Emergency Medicine | Admitting: Emergency Medicine

## 2015-09-04 ENCOUNTER — Emergency Department (HOSPITAL_COMMUNITY): Payer: Medicaid Other

## 2015-09-04 DIAGNOSIS — K219 Gastro-esophageal reflux disease without esophagitis: Secondary | ICD-10-CM | POA: Insufficient documentation

## 2015-09-04 DIAGNOSIS — Z791 Long term (current) use of non-steroidal anti-inflammatories (NSAID): Secondary | ICD-10-CM | POA: Diagnosis not present

## 2015-09-04 DIAGNOSIS — G4733 Obstructive sleep apnea (adult) (pediatric): Secondary | ICD-10-CM | POA: Insufficient documentation

## 2015-09-04 DIAGNOSIS — G2581 Restless legs syndrome: Secondary | ICD-10-CM | POA: Insufficient documentation

## 2015-09-04 DIAGNOSIS — R300 Dysuria: Secondary | ICD-10-CM | POA: Insufficient documentation

## 2015-09-04 DIAGNOSIS — M1990 Unspecified osteoarthritis, unspecified site: Secondary | ICD-10-CM | POA: Insufficient documentation

## 2015-09-04 DIAGNOSIS — Z87891 Personal history of nicotine dependence: Secondary | ICD-10-CM | POA: Diagnosis not present

## 2015-09-04 DIAGNOSIS — Z862 Personal history of diseases of the blood and blood-forming organs and certain disorders involving the immune mechanism: Secondary | ICD-10-CM | POA: Insufficient documentation

## 2015-09-04 DIAGNOSIS — R109 Unspecified abdominal pain: Secondary | ICD-10-CM | POA: Diagnosis not present

## 2015-09-04 DIAGNOSIS — I10 Essential (primary) hypertension: Secondary | ICD-10-CM | POA: Insufficient documentation

## 2015-09-04 DIAGNOSIS — Z3202 Encounter for pregnancy test, result negative: Secondary | ICD-10-CM | POA: Diagnosis not present

## 2015-09-04 DIAGNOSIS — Z9981 Dependence on supplemental oxygen: Secondary | ICD-10-CM | POA: Insufficient documentation

## 2015-09-04 DIAGNOSIS — R739 Hyperglycemia, unspecified: Secondary | ICD-10-CM

## 2015-09-04 DIAGNOSIS — E1165 Type 2 diabetes mellitus with hyperglycemia: Secondary | ICD-10-CM | POA: Insufficient documentation

## 2015-09-04 DIAGNOSIS — Z7984 Long term (current) use of oral hypoglycemic drugs: Secondary | ICD-10-CM | POA: Diagnosis not present

## 2015-09-04 DIAGNOSIS — J449 Chronic obstructive pulmonary disease, unspecified: Secondary | ICD-10-CM | POA: Insufficient documentation

## 2015-09-04 DIAGNOSIS — Z9851 Tubal ligation status: Secondary | ICD-10-CM | POA: Diagnosis not present

## 2015-09-04 DIAGNOSIS — F319 Bipolar disorder, unspecified: Secondary | ICD-10-CM | POA: Diagnosis not present

## 2015-09-04 DIAGNOSIS — Z79899 Other long term (current) drug therapy: Secondary | ICD-10-CM | POA: Insufficient documentation

## 2015-09-04 DIAGNOSIS — Z9119 Patient's noncompliance with other medical treatment and regimen: Secondary | ICD-10-CM | POA: Diagnosis not present

## 2015-09-04 DIAGNOSIS — Z794 Long term (current) use of insulin: Secondary | ICD-10-CM | POA: Insufficient documentation

## 2015-09-04 DIAGNOSIS — Z9049 Acquired absence of other specified parts of digestive tract: Secondary | ICD-10-CM | POA: Insufficient documentation

## 2015-09-04 LAB — COMPREHENSIVE METABOLIC PANEL
ALT: 16 U/L (ref 14–54)
ANION GAP: 8 (ref 5–15)
AST: 12 U/L — ABNORMAL LOW (ref 15–41)
Albumin: 2.6 g/dL — ABNORMAL LOW (ref 3.5–5.0)
Alkaline Phosphatase: 96 U/L (ref 38–126)
BILIRUBIN TOTAL: 0.5 mg/dL (ref 0.3–1.2)
BUN: 21 mg/dL — ABNORMAL HIGH (ref 6–20)
CO2: 24 mmol/L (ref 22–32)
Calcium: 9.1 mg/dL (ref 8.9–10.3)
Chloride: 100 mmol/L — ABNORMAL LOW (ref 101–111)
Creatinine, Ser: 1.96 mg/dL — ABNORMAL HIGH (ref 0.44–1.00)
GFR calc Af Amer: 34 mL/min — ABNORMAL LOW (ref >60)
GFR, EST NON AFRICAN AMERICAN: 29 mL/min — AB (ref >60)
Glucose, Bld: 621 mg/dL (ref 65–99)
POTASSIUM: 4.3 mmol/L (ref 3.5–5.1)
Sodium: 132 mmol/L — ABNORMAL LOW (ref 135–145)
TOTAL PROTEIN: 6 g/dL — AB (ref 6.5–8.1)

## 2015-09-04 LAB — CBG MONITORING, ED
GLUCOSE-CAPILLARY: 425 mg/dL — AB (ref 65–99)
GLUCOSE-CAPILLARY: 333 mg/dL — AB (ref 65–99)

## 2015-09-04 LAB — URINE MICROSCOPIC-ADD ON
Bacteria, UA: NONE SEEN
WBC, UA: NONE SEEN WBC/hpf (ref 0–5)

## 2015-09-04 LAB — URINALYSIS, ROUTINE W REFLEX MICROSCOPIC
BILIRUBIN URINE: NEGATIVE
KETONES UR: NEGATIVE mg/dL
Leukocytes, UA: NEGATIVE
NITRITE: NEGATIVE
PH: 6 (ref 5.0–8.0)
Protein, ur: >300 mg/dL — AB
Specific Gravity, Urine: 1.015 (ref 1.005–1.030)

## 2015-09-04 LAB — CBC WITH DIFFERENTIAL/PLATELET
BASOS PCT: 1 %
Basophils Absolute: 0 10*3/uL (ref 0.0–0.1)
EOS ABS: 0.2 10*3/uL (ref 0.0–0.7)
Eosinophils Relative: 3 %
HEMATOCRIT: 36.8 % (ref 36.0–46.0)
HEMOGLOBIN: 12.2 g/dL (ref 12.0–15.0)
LYMPHS ABS: 2.3 10*3/uL (ref 0.7–4.0)
Lymphocytes Relative: 45 %
MCH: 28.2 pg (ref 26.0–34.0)
MCHC: 33.2 g/dL (ref 30.0–36.0)
MCV: 85 fL (ref 78.0–100.0)
MONO ABS: 0.3 10*3/uL (ref 0.1–1.0)
MONOS PCT: 6 %
NEUTROS ABS: 2.3 10*3/uL (ref 1.7–7.7)
NEUTROS PCT: 45 %
Platelets: 198 10*3/uL (ref 150–400)
RBC: 4.33 MIL/uL (ref 3.87–5.11)
RDW: 13.9 % (ref 11.5–15.5)
WBC: 5.1 10*3/uL (ref 4.0–10.5)

## 2015-09-04 LAB — LIPASE, BLOOD: LIPASE: 53 U/L — AB (ref 11–51)

## 2015-09-04 LAB — PREGNANCY, URINE: PREG TEST UR: NEGATIVE

## 2015-09-04 MED ORDER — SODIUM CHLORIDE 0.9 % IV SOLN: INTRAVENOUS | Status: DC

## 2015-09-04 MED ORDER — ONDANSETRON HCL 4 MG/2ML IJ SOLN
4.0000 mg | Freq: Once | INTRAMUSCULAR | Status: AC
  Administered 2015-09-04: 4 mg via INTRAVENOUS
  Filled 2015-09-04: qty 2

## 2015-09-04 MED ORDER — HYDROCODONE-ACETAMINOPHEN 5-325 MG PO TABS
1.0000 | ORAL_TABLET | ORAL | Status: AC
  Administered 2015-09-04: 1 via ORAL
  Filled 2015-09-04: qty 1

## 2015-09-04 MED ORDER — SODIUM CHLORIDE 0.9 % IV BOLUS (SEPSIS)
1000.0000 mL | Freq: Once | INTRAVENOUS | Status: DC
Start: 2015-09-04 — End: 2015-09-04

## 2015-09-04 MED ORDER — SODIUM CHLORIDE 0.9 % IV BOLUS (SEPSIS)
1000.0000 mL | Freq: Once | INTRAVENOUS | Status: AC
  Administered 2015-09-04: 1000 mL via INTRAVENOUS

## 2015-09-04 MED ORDER — SODIUM CHLORIDE 0.9 % IV SOLN
INTRAVENOUS | Status: DC
  Administered 2015-09-04: 5.4 [IU]/h via INTRAVENOUS
  Filled 2015-09-04: qty 2.5

## 2015-09-04 NOTE — ED Notes (Signed)
Patient requesting to go home. Dr. Tomi Bamberger made aware.

## 2015-09-04 NOTE — ED Provider Notes (Signed)
CSN: [id number]     Arrival date & time 09/04/15  [id number] History  By signing my name below, I, Hilda Lias, attest that this documentation has been prepared under the direction and in the presence of Dorie Rank, MD. Electronically Signed: Hilda Lias, ED Scribe. 09/04/2015. 8:59 AM.    Chief Complaint  Patient presents with  . Flank Pain      Patient is a 50 y.o. female presenting with flank pain. The history is provided by the patient. No language interpreter was used.  Flank Pain    HPI Comments: Martha [name] is a 50 y.o. female who presents to the Emergency Department complaining of constant, worsening bilateral lower flank pain that radiates to both sides of the lower back that has been present for a week. Pt states that nothing makes it better or worse, and states she has been drinking Cranberry juice for a week because of her concerns for having a UTI. Pt also reports associated constant dysuria that has been present for the same amount of time. Pt denies vaginal discharge, vaginal bleeding, nausea, vomiting, diarrhea, constipation.    Past Medical History  Diagnosis Date  . Diabetes mellitus   . Hypertension   . RLS (restless legs syndrome)   . Acid reflux   . Bipolar 1 disorder ([place])   . Depression   . OSA (obstructive sleep apnea)     cpap  . Anemia   . Arthritis   . Degenerative disc disease, thoracic   . Shortness of breath   . Obesity (BMI 30-39.9)   . COPD (chronic obstructive pulmonary disease) ([place]) 2014    bronchitis  . Headache   . Cervical radiculopathy   . Noncompliance with medication regimen    Past Surgical History  Procedure Laterality Date  . Tubal ligation    . Endometrial ablation    . Colonoscopy  06/06/2010    [id number] bleeding secondary to internal hemorrhoids but incomplete evaluation secondary to poor right colon prep/small rectal and sigmoid colon polyps (hyperplastic). PROPOFOL  . Esophagogastroduodenoscopy  06/06/2010    [id number]  erythema and edema of body of stomach, with sessile polypoid lesions. bx benign. no h.pylori  . Colonoscopy  May 2013    Dr. Gilliam/[facility]: 5 mm a descending colon polyp, hyperplastic. Adequate bowel prep.  . Flexible sigmoidoscopy N/A 05/17/2013    Dr. Oneida Alar: moderate sized internal hemorrhoids  . Esophagogastroduodenoscopy (egd) with propofol N/A 05/17/2013    Dr. Oneida Alar: normal esophagus, moderate nodular gastritis, negative path, empiric Savary dilation  . Savory dilation N/A 05/17/2013    Procedure: SAVORY DILATION;  Surgeon: [name], MD;  Location: [facility];  Service: Endoscopy;  Laterality: N/A;  14/15/16  . Hemorrhoid banding N/A 05/17/2013    Procedure: HEMORRHOID BANDING;  Surgeon: [name], MD;  Location: [facility];  Service: Endoscopy;  Laterality: N/A;  2 bands placed  . Biopsy N/A 05/17/2013    Procedure: BIOPSY;  Surgeon: [name], MD;  Location: [facility];  Service: Endoscopy;  Laterality: N/A;  . Irrigation and debridement abscess Right 06/01/2013    Procedure: INCISION AND DRAINAGE AND DEBRIDEMENT ABSCESS RIGHT BREAST;  Surgeon: [name], MD;  Location: [facility];  Service: General;  Laterality: Right;  . Incision and drainage abscess Left 10/11/2013    Procedure: INCISION AND DRAINAGE AND DEBRIDEMENT LEFT BREAST  ABSCESS;  Surgeon: [name], MD;  Location: [facility];  Service: General;  Laterality: Left;  . Cholecystectomy N/A 04/27/2014  Procedure: LAPAROSCOPIC CHOLECYSTECTOMY;  Surgeon: [name], MD;  Location: [facility];  Service: General;  Laterality: N/A;   Family History  Problem Relation Age of Onset  . Adopted: Yes  . Family history unknown: Yes   Social History  Substance Use Topics  . Smoking status: Former Smoker -- 1.50 packs/day for 25 years    Types: Cigarettes    Quit date: 10/23/2007  . Smokeless tobacco: Former Systems developer     Comment: Quit x 6 years ago  . Alcohol Use: No   OB History    Gravida Para Term Preterm AB TAB  SAB Ectopic Multiple Living   2 2 1 1      2      Review of Systems  Genitourinary: Positive for flank pain.    A complete 10 system review of systems was obtained and all systems are negative except as noted in the HPI and PMH.    Allergies  Codeine  Home Medications   Prior to Admission medications   Medication Sig Start Date End Date Taking? Authorizing Provider  albuterol (PROVENTIL) (2.5 MG/3ML) 0.083% nebulizer solution Take 2.5 mg by nebulization 4 (four) times daily.    Yes Historical Provider, MD  ALPRAZolam Duanne Moron) 1 MG tablet Take 1 mg by mouth 4 (four) times daily.     Yes Historical Provider, MD  amLODipine (NORVASC) 10 MG tablet Take 10 mg by mouth daily.   Yes Historical Provider, MD  celecoxib (CELEBREX) 100 MG capsule Take 1 capsule (100 mg total) by mouth 2 (two) times daily. 07/23/14  Yes Lily Kocher, PA-C  diclofenac (VOLTAREN) 75 MG EC tablet Take 1 tablet (75 mg total) by mouth 2 (two) times daily with a meal. 10/17/14  Yes Carole Civil, MD  dicyclomine (BENTYL) 10 MG capsule Take 1 capsule (10 mg total) by mouth 4 (four) times daily -  before meals and at bedtime. 02/09/14  Yes Orvil Feil, NP  DULoxetine (CYMBALTA) 60 MG capsule Take 60 mg by mouth daily.   Yes Historical Provider, MD  esomeprazole (NEXIUM) 40 MG capsule Take 40 mg by mouth every morning.   Yes Historical Provider, MD  gabapentin (NEURONTIN) 300 MG capsule Take 300 mg by mouth 3 (three) times daily.   Yes Historical Provider, MD  HYDROcodone-acetaminophen (NORCO) 5-325 MG per tablet Take 1 tablet by mouth every 4 (four) hours as needed for moderate pain. [id number]  Yes Delora Fuel, MD  ibuprofen (ADVIL,MOTRIN) 800 MG tablet Take 1 tablet (800 mg total) by mouth 3 (three) times daily. Patient taking differently: Take 800 mg by mouth every 8 (eight) hours as needed.  06/10/15  Yes Noemi Chapel, MD  insulin aspart protamine- aspart (NOVOLOG MIX 70/30) (70-30) 100 UNIT/ML injection Inject 1-25  Units into the skin 2 (two) times daily. Per sliding scale-patient sliding scale   Yes Historical Provider, MD  insulin glargine (LANTUS) 100 UNIT/ML injection Inject 80 Units into the skin 2 (two) times daily.    Yes Historical Provider, MD  lisinopril-hydrochlorothiazide (PRINZIDE,ZESTORETIC) 20-12.5 MG per tablet Take 1 tablet by mouth 2 (two) times daily.    Yes Historical Provider, MD  meloxicam (MOBIC) 15 MG tablet Take 15 mg by mouth daily.   Yes Historical Provider, MD  metFORMIN (GLUCOPHAGE) 1000 MG tablet Take 1,000 mg by mouth 2 (two) times daily with a meal.   Yes Historical Provider, MD  rOPINIRole (REQUIP) 1 MG tablet Take 1 mg by mouth at bedtime.    Yes Historical  Provider, MD  simvastatin (ZOCOR) 40 MG tablet Take 40 mg by mouth every evening.   Yes Historical Provider, MD  meclizine (ANTIVERT) 25 MG tablet Take 1 tablet (25 mg total) by mouth 3 (three) times daily as needed for dizziness. Patient not taking: Reported on 09/04/2015 06/21/15   Francine Graven, DO   BP 158/89 mmHg  Pulse 69  Temp(Src) 97.8 F (36.6 C) (Oral)  Resp 16  Ht 5\' 7"  (1.702 m)  Wt 161.027 kg  BMI 55.59 kg/m2  SpO2 100% Physical Exam  Constitutional: No distress.  Morbidly obese  HENT:  Head: Normocephalic and atraumatic.  Right Ear: External ear normal.  Left Ear: External ear normal.  Eyes: Conjunctivae are normal. Right eye exhibits no discharge. Left eye exhibits no discharge. No scleral icterus.  Neck: Neck supple. No tracheal deviation present.  Cardiovascular: Normal rate, regular rhythm and intact distal pulses.   Pulmonary/Chest: Effort normal and breath sounds normal. No stridor. No respiratory distress. She has no wheezes. She has no rales.  Abdominal: Soft. Bowel sounds are normal. She exhibits no distension and no mass. There is no tenderness. There is CVA tenderness (right). There is no rebound and no guarding. No hernia.  Musculoskeletal: She exhibits no edema or tenderness.   Neurological: She is alert. She has normal strength. No cranial nerve deficit (no facial droop, extraocular movements intact, no slurred speech) or sensory deficit. She exhibits normal muscle tone. She displays no seizure activity. Coordination normal.  Skin: Skin is warm and dry. No rash noted.  Psychiatric: She has a normal mood and affect.  Nursing note and vitals reviewed.   ED Course  Procedures (including critical care time)  DIAGNOSTIC STUDIES: Oxygen Saturation is 98% on room air, normal by my interpretation.    COORDINATION OF CARE: 8:40 AM Discussed treatment plan with pt at bedside and pt agreed to plan.   Labs Review Labs Reviewed  URINALYSIS, ROUTINE W REFLEX MICROSCOPIC (NOT AT [facility]) - Abnormal; Notable for the following:    Glucose, UA >1000 (*)    Hgb urine dipstick TRACE (*)    Protein, ur >300 (*)    All other components within normal limits  COMPREHENSIVE METABOLIC PANEL - Abnormal; Notable for the following:    Sodium 132 (*)    Chloride 100 (*)    Glucose, Bld 621 (*)    BUN 21 (*)    Creatinine, Ser 1.96 (*)    Total Protein 6.0 (*)    Albumin 2.6 (*)    AST 12 (*)    GFR calc non Af Amer 29 (*)    GFR calc Af Amer 34 (*)    All other components within normal limits  LIPASE, BLOOD - Abnormal; Notable for the following:    Lipase 53 (*)    All other components within normal limits  URINE MICROSCOPIC-ADD ON - Abnormal; Notable for the following:    Squamous Epithelial / LPF 0-5 (*)    All other components within normal limits  CBG MONITORING, ED - Abnormal; Notable for the following:    Glucose-Capillary 425 (*)    All other components within normal limits  CBC WITH DIFFERENTIAL/PLATELET  PREGNANCY, URINE    Imaging Review Ct Renal Stone Study  09/04/2015  CLINICAL DATA:  Bilateral flank pain for 1 week. Microscopic hematuria. No history of kidney stones. EXAM: CT ABDOMEN AND PELVIS WITHOUT CONTRAST TECHNIQUE: Multidetector CT imaging of the  abdomen and pelvis was performed following the standard protocol without  IV contrast. COMPARISON:  04/20/2014 FINDINGS: Lung bases:  Clear.  Heart normal size. The failure: Liver is unremarkable. Gallbladder surgically absent. No bile duct dilation. Spleen, pancreas, adrenal glands:  Unremarkable. 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 No discrete renal masses. No stones. No hydronephrosis. Ureters are normal in course and in caliber. Bladder is minimally distended but otherwise unremarkable. Uterus and adnexa:  Unremarkable. Lymph nodes:  No adenopathy. Ascites:  None. Gastrointestinal:  Unremarkable.  Normal appendix visualized. Musculoskeletal: Arthropathic changes of both hips. Mild degenerative changes of the visualized spine. No osteoblastic or osteolytic lesions. IMPRESSION: 1. No discrete renal or ureteral stone.  No obstructive uropathy. 2. Small area of relative hyperattenuation corresponding to a medullary pyramid along the lower pole of the left kidney. This could reflect an area of tubular calcinosis, new from the prior exam. It could potentially reflect an area hemorrhage within the medullary pyramid. No discrete renal mass. 3. Chronic findings include changes from a cholecystectomy and degenerative changes of the visualized spine and arthropathic changes of the hips. Electronically Signed   By: Lajean Manes M.D.   On: 09/04/2015 11:04   I have personally reviewed and evaluated these images and lab results as part of my medical decision-making.  Medications  insulin regular (NOVOLIN R,HUMULIN R) 250 Units in sodium chloride 0.9 % 250 mL (1 Units/mL) infusion ( Intravenous Stopped 09/04/15 1328)  sodium chloride 0.9 % bolus 1,000 mL (0 mLs Intravenous Stopped 09/04/15 1328)    And  sodium chloride 0.9 % bolus 1,000 mL (not administered)    And  0.9 %  sodium chloride infusion (not  administered)  ondansetron (ZOFRAN) injection 4 mg (4 mg Intravenous Given 09/04/15 0915)  HYDROcodone-acetaminophen (NORCO/VICODIN) 5-325 MG per tablet 1 tablet (1 tablet Oral Given 09/04/15 0915)     MDM   Final diagnoses:  Hyperglycemia    Patient's laboratory tests show hyperglycemia.  I reviewed prior labs and the patient has had similar episodes in the past. She states she has been taking her medications but she has been drinking a lot of cranberry juice. This may be contributing to the hyperglycemia. Electrolytes are not suggestive of DKA.  Patient complains of dysuria but there is no evidence of a definitive urinary tract infection. CT scan does not show evidence of acute abnormality including a kidney stone.  Plan on IV fluids and insulin to correct her hyperglycemia.  1332  Blood sugar has improved into the 300s.  No DKA.  Cautioned her to avoid sugary drinks.    I personally performed the services described in this documentation, which was scribed in my presence.  The recorded information has been reviewed and is accurate.    Dorie Rank, MD 09/04/15 [phone]

## 2015-09-04 NOTE — ED Notes (Signed)
Pt c/o pain in bilateral sides of lower back radiating around to lower abd x 1 week.  REports has been drinking cranberry juice thinking she may have  UTI.

## 2015-09-04 NOTE — Discharge Instructions (Signed)
Hyperglycemia °Hyperglycemia occurs when the glucose (sugar) in your blood is too high. Hyperglycemia can happen for many reasons, but it most often happens to people who do not know they have diabetes or are not managing their diabetes properly.  °CAUSES  °Whether you have diabetes or not, there are other causes of hyperglycemia. Hyperglycemia can occur when you have diabetes, but it can also occur in other situations that you might not be as aware of, such as: °Diabetes °· If you have diabetes and are having problems controlling your blood glucose, hyperglycemia could occur because of some of the following reasons: °¨ Not following your meal plan. °¨ Not taking your diabetes medications or not taking it properly. °¨ Exercising less or doing less activity than you normally do. °¨ Being sick. °Pre-diabetes °· This cannot be ignored. Before people develop Type 2 diabetes, they almost always have "pre-diabetes." This is when your blood glucose levels are higher than normal, but not yet high enough to be diagnosed as diabetes. Research has shown that some long-term damage to the body, especially the heart and circulatory system, may already be occurring during pre-diabetes. If you take action to manage your blood glucose when you have pre-diabetes, you may delay or prevent Type 2 diabetes from developing. °Stress °· If you have diabetes, you may be "diet" controlled or on oral medications or insulin to control your diabetes. However, you may find that your blood glucose is higher than usual in the hospital whether you have diabetes or not. This is often referred to as "stress hyperglycemia." Stress can elevate your blood glucose. This happens because of hormones put out by the body during times of stress. If stress has been the cause of your high blood glucose, it can be followed regularly by your caregiver. That way he/she can make sure your hyperglycemia does not continue to get worse or progress to  diabetes. °Steroids °· Steroids are medications that act on the infection fighting system (immune system) to block inflammation or infection. One side effect can be a rise in blood glucose. Most people can produce enough extra insulin to allow for this rise, but for those who cannot, steroids make blood glucose levels go even higher. It is not unusual for steroid treatments to "uncover" diabetes that is developing. It is not always possible to determine if the hyperglycemia will go away after the steroids are stopped. A special blood test called an A1c is sometimes done to determine if your blood glucose was elevated before the steroids were started. °SYMPTOMS °· Thirsty. °· Frequent urination. °· Dry mouth. °· Blurred vision. °· Tired or fatigue. °· Weakness. °· Sleepy. °· Tingling in feet or leg. °DIAGNOSIS  °Diagnosis is made by monitoring blood glucose in one or all of the following ways: °· A1c test. This is a chemical found in your blood. °· Fingerstick blood glucose monitoring. °· Laboratory results. °TREATMENT  °First, knowing the cause of the hyperglycemia is important before the hyperglycemia can be treated. Treatment may include, but is not be limited to: °· Education. °· Change or adjustment in medications. °· Change or adjustment in meal plan. °· Treatment for an illness, infection, etc. °· More frequent blood glucose monitoring. °· Change in exercise plan. °· Decreasing or stopping steroids. °· Lifestyle changes. °HOME CARE INSTRUCTIONS  °· Test your blood glucose as directed. °· Exercise regularly. Your caregiver will give you instructions about exercise. Pre-diabetes or diabetes which comes on with stress is helped by exercising. °· Eat wholesome,   balanced meals. Eat often and at regular, fixed times. Your caregiver or nutritionist will give you a meal plan to guide your sugar intake. °· Being at an ideal weight is important. If needed, losing as little as 10 to 15 pounds may help improve blood  glucose levels. °SEEK MEDICAL CARE IF:  °· You have questions about medicine, activity, or diet. °· You continue to have symptoms (problems such as increased thirst, urination, or weight gain). °SEEK IMMEDIATE MEDICAL CARE IF:  °· You are vomiting or have diarrhea. °· Your breath smells fruity. °· You are breathing faster or slower. °· You are very sleepy or incoherent. °· You have numbness, tingling, or pain in your feet or hands. °· You have chest pain. °· Your symptoms get worse even though you have been following your caregiver's orders. °· If you have any other questions or concerns. °  °This information is not intended to replace advice given to you by your health care provider. Make sure you discuss any questions you have with your health care provider. °  °Document Released: 01/07/2001 Document Revised: 10/06/2011 Document Reviewed: 03/20/2015 °Elsevier Interactive Patient Education ©2016 [employer] ° °

## 2015-09-04 NOTE — ED Notes (Signed)
Awaiting insulin drip from pharmacy.  Pt made aware of delay.

## 2015-09-04 NOTE — ED Notes (Signed)
MD at bedside. 

## 2015-09-04 NOTE — ED Notes (Signed)
CRITICAL VALUE ALERT  Critical value received:  Glucose 621  Date of notification:  09/04/15  Time of notification:  0948  Critical value read back: yes Nurse who received alert:  Ilda Mori  MD notified (1st page):  J. knapp  Time of first page:  [phone]  MD notified (2nd page):  Time of second page:  Responding MD:  Hillard Danker   Time MD responded:  [phone]

## 2015-09-22 ENCOUNTER — Ambulatory Visit
Admission: RE | Admit: 2015-09-22 | Discharge: 2015-09-22 | Disposition: A | Discharge status: Home / Self Care | Payer: Medicaid Other | Source: Ambulatory Visit | Attending: Pulmonary Disease | Admitting: Pulmonary Disease

## 2015-09-22 DIAGNOSIS — M5412 Radiculopathy, cervical region: Secondary | ICD-10-CM

## 2015-10-05 ENCOUNTER — Emergency Department (HOSPITAL_COMMUNITY)
Admission: EM | Admit: 2015-10-05 | Discharge: 2015-10-05 | Disposition: A | Discharge status: Home / Self Care | Payer: Medicaid Other | Attending: Dermatology | Admitting: Dermatology

## 2015-10-05 ENCOUNTER — Emergency Department (HOSPITAL_COMMUNITY): Payer: Medicaid Other

## 2015-10-05 ENCOUNTER — Encounter (HOSPITAL_COMMUNITY): Payer: Self-pay | Admitting: Emergency Medicine

## 2015-10-05 DIAGNOSIS — Z87891 Personal history of nicotine dependence: Secondary | ICD-10-CM | POA: Insufficient documentation

## 2015-10-05 DIAGNOSIS — R05 Cough: Secondary | ICD-10-CM | POA: Insufficient documentation

## 2015-10-05 DIAGNOSIS — Z5321 Procedure and treatment not carried out due to patient leaving prior to being seen by health care provider: Secondary | ICD-10-CM | POA: Diagnosis not present

## 2015-10-05 LAB — CBG MONITORING, ED: GLUCOSE-CAPILLARY: 444 mg/dL — AB (ref 65–99)

## 2015-10-05 NOTE — ED Notes (Signed)
Patient with cough, congestion, emesis x 2 days. Report headaches. Afebrile.

## 2015-10-10 ENCOUNTER — Emergency Department (HOSPITAL_COMMUNITY): Payer: Medicaid Other

## 2015-10-10 ENCOUNTER — Other Ambulatory Visit: Payer: Self-pay

## 2015-10-10 ENCOUNTER — Emergency Department (HOSPITAL_COMMUNITY)
Admission: EM | Admit: 2015-10-10 | Discharge: 2015-10-10 | Disposition: A | Discharge status: Home / Self Care | Payer: Medicaid Other | Attending: Emergency Medicine | Admitting: Emergency Medicine

## 2015-10-10 ENCOUNTER — Encounter (HOSPITAL_COMMUNITY): Payer: Self-pay | Admitting: Emergency Medicine

## 2015-10-10 DIAGNOSIS — R059 Cough, unspecified: Secondary | ICD-10-CM

## 2015-10-10 DIAGNOSIS — Z79899 Other long term (current) drug therapy: Secondary | ICD-10-CM | POA: Diagnosis not present

## 2015-10-10 DIAGNOSIS — J9801 Acute bronchospasm: Secondary | ICD-10-CM

## 2015-10-10 DIAGNOSIS — Z794 Long term (current) use of insulin: Secondary | ICD-10-CM | POA: Insufficient documentation

## 2015-10-10 DIAGNOSIS — I10 Essential (primary) hypertension: Secondary | ICD-10-CM | POA: Insufficient documentation

## 2015-10-10 DIAGNOSIS — E119 Type 2 diabetes mellitus without complications: Secondary | ICD-10-CM | POA: Diagnosis not present

## 2015-10-10 DIAGNOSIS — F329 Major depressive disorder, single episode, unspecified: Secondary | ICD-10-CM | POA: Diagnosis not present

## 2015-10-10 DIAGNOSIS — R05 Cough: Secondary | ICD-10-CM

## 2015-10-10 DIAGNOSIS — R079 Chest pain, unspecified: Secondary | ICD-10-CM | POA: Diagnosis not present

## 2015-10-10 DIAGNOSIS — M1990 Unspecified osteoarthritis, unspecified site: Secondary | ICD-10-CM | POA: Diagnosis not present

## 2015-10-10 DIAGNOSIS — R0602 Shortness of breath: Secondary | ICD-10-CM

## 2015-10-10 DIAGNOSIS — Z87891 Personal history of nicotine dependence: Secondary | ICD-10-CM | POA: Insufficient documentation

## 2015-10-10 DIAGNOSIS — Z7982 Long term (current) use of aspirin: Secondary | ICD-10-CM | POA: Insufficient documentation

## 2015-10-10 DIAGNOSIS — J449 Chronic obstructive pulmonary disease, unspecified: Secondary | ICD-10-CM | POA: Diagnosis not present

## 2015-10-10 LAB — URINE MICROSCOPIC-ADD ON

## 2015-10-10 LAB — TROPONIN I: Troponin I: <0.03 ng/mL (ref <0.031)

## 2015-10-10 LAB — URINALYSIS, ROUTINE W REFLEX MICROSCOPIC
Bilirubin Urine: NEGATIVE
Glucose, UA: >1000 mg/dL — AB
Ketones, ur: NEGATIVE mg/dL
Leukocytes, UA: NEGATIVE
Nitrite: NEGATIVE
Protein, ur: >300 mg/dL — AB
Specific Gravity, Urine: 1.015 (ref 1.005–1.030)
pH: 7.5 (ref 5.0–8.0)

## 2015-10-10 LAB — COMPREHENSIVE METABOLIC PANEL
ALT: 19 U/L (ref 14–54)
AST: 16 U/L (ref 15–41)
Albumin: 2.7 g/dL — ABNORMAL LOW (ref 3.5–5.0)
Alkaline Phosphatase: 112 U/L (ref 38–126)
Anion gap: 10 (ref 5–15)
BUN: 25 mg/dL — ABNORMAL HIGH (ref 6–20)
CO2: 29 mmol/L (ref 22–32)
Calcium: 8.8 mg/dL — ABNORMAL LOW (ref 8.9–10.3)
Chloride: 98 mmol/L — ABNORMAL LOW (ref 101–111)
Creatinine, Ser: 2.1 mg/dL — ABNORMAL HIGH (ref 0.44–1.00)
GFR calc Af Amer: 31 mL/min — ABNORMAL LOW (ref >60)
GFR calc non Af Amer: 27 mL/min — ABNORMAL LOW (ref >60)
Glucose, Bld: 351 mg/dL — ABNORMAL HIGH (ref 65–99)
Potassium: 3.6 mmol/L (ref 3.5–5.1)
Sodium: 137 mmol/L (ref 135–145)
Total Bilirubin: 0.4 mg/dL (ref 0.3–1.2)
Total Protein: 6.1 g/dL — ABNORMAL LOW (ref 6.5–8.1)

## 2015-10-10 LAB — I-STAT TROPONIN, ED: Troponin i, poc: 0 ng/mL (ref 0.00–0.08)

## 2015-10-10 LAB — CBC
HEMATOCRIT: 40 % (ref 36.0–46.0)
HEMOGLOBIN: 13.5 g/dL (ref 12.0–15.0)
MCH: 28.2 pg (ref 26.0–34.0)
MCHC: 33.8 g/dL (ref 30.0–36.0)
MCV: 83.5 fL (ref 78.0–100.0)
Platelets: 198 10*3/uL (ref 150–400)
RBC: 4.79 MIL/uL (ref 3.87–5.11)
RDW: 13.5 % (ref 11.5–15.5)
WBC: 6.8 10*3/uL (ref 4.0–10.5)

## 2015-10-10 LAB — BRAIN NATRIURETIC PEPTIDE: B Natriuretic Peptide: 126 pg/mL — ABNORMAL HIGH (ref 0.0–100.0)

## 2015-10-10 LAB — D-DIMER, QUANTITATIVE (NOT AT ARMC): D-Dimer, Quant: 0.28 ug/mL-FEU (ref 0.00–0.50)

## 2015-10-10 MED ORDER — SODIUM CHLORIDE 0.9 % IV BOLUS (SEPSIS)
1000.0000 mL | Freq: Once | INTRAVENOUS | Status: AC
  Administered 2015-10-10: 1000 mL via INTRAVENOUS

## 2015-10-10 MED ORDER — ASPIRIN 81 MG PO CHEW
324.0000 mg | CHEWABLE_TABLET | Freq: Once | ORAL | Status: AC
  Administered 2015-10-10: 324 mg via ORAL
  Filled 2015-10-10: qty 4

## 2015-10-10 MED ORDER — NITROGLYCERIN 0.4 MG SL SUBL: 0.4000 mg | SUBLINGUAL_TABLET | SUBLINGUAL | Status: DC | PRN

## 2015-10-10 MED ORDER — BENZONATATE 100 MG PO CAPS: 100.0000 mg | ORAL_CAPSULE | Freq: Three times a day (TID) | ORAL | Status: DC | PRN

## 2015-10-10 MED ORDER — ALBUTEROL SULFATE (2.5 MG/3ML) 0.083% IN NEBU
2.5000 mg | INHALATION_SOLUTION | Freq: Once | RESPIRATORY_TRACT | Status: AC
  Administered 2015-10-10: 2.5 mg via RESPIRATORY_TRACT

## 2015-10-10 NOTE — Discharge Instructions (Signed)
Medications: Tessalon Perles 100 mg  Treatment: Take Tessalon Perles 3 times per day as needed for cough. Use your albuterol with your nebulizer at home as needed for shortness of breath. Stay hydrated. Please pay close attention to controlling your blood sugar. Illness can exacerbate your diabetes. Continue to take your insulin and metformin to control your elevated blood sugar. Continue to take blood pressure medications (Prinzide and amlodipine) as prescribed for your elevated blood pressure.  Follow-up: Please follow up with Dr. Luan Pulling tomorrow for further evaluation of your cough, chest pain, shortness of breath, as well as elevated blood glucose and blood pressure. Please return to the emergency department if your chest pain and shortness of breath worsens, you develop a fever, you feel weak or dizzy, or experience any other concerning symptom.  Shortness of Breath Shortness of breath means you have trouble breathing. It could also mean that you have a medical problem. You should get immediate medical care for shortness of breath. CAUSES   Not enough oxygen in the air such as with high altitudes or a smoke-filled room.  Certain lung diseases, infections, or problems.  Heart disease or conditions, such as angina or heart failure.  Low red blood cells (anemia).  Poor physical fitness, which can cause shortness of breath when you exercise.  Chest or back injuries or stiffness.  Being overweight.  Smoking.  Anxiety, which can make you feel like you are not getting enough air. DIAGNOSIS  Serious medical problems can often be found during your physical exam. Tests may also be done to determine why you are having shortness of breath. Tests may include:  Chest X-rays.  Lung function tests.  Blood tests.  An electrocardiogram (ECG).  An ambulatory electrocardiogram. An ambulatory ECG records your heartbeat patterns over a 24-hour period.  Exercise testing.  A transthoracic  echocardiogram (TTE). During echocardiography, sound waves are used to evaluate how blood flows through your heart.  A transesophageal echocardiogram (TEE).  Imaging scans. Your health care provider may not be able to find a cause for your shortness of breath after your exam. In this case, it is important to have a follow-up exam with your health care provider as directed.  TREATMENT  Treatment for shortness of breath depends on the cause of your symptoms and can vary greatly. HOME CARE INSTRUCTIONS   Do not smoke. Smoking is a common cause of shortness of breath. If you smoke, ask for help to quit.  Avoid being around chemicals or things that may bother your breathing, such as paint fumes and dust.  Rest as needed. Slowly resume your usual activities.  If medicines were prescribed, take them as directed for the full length of time directed. This includes oxygen and any inhaled medicines.  Keep all follow-up appointments as directed by your health care provider. SEEK MEDICAL CARE IF:   Your condition does not improve in the time expected.  You have a hard time doing your normal activities even with rest.  You have any new symptoms. SEEK IMMEDIATE MEDICAL CARE IF:   Your shortness of breath gets worse.  You feel light-headed, faint, or develop a cough not controlled with medicines.  You start coughing up blood.  You have pain with breathing.  You have chest pain or pain in your arms, shoulders, or abdomen.  You have a fever.  You are unable to walk up stairs or exercise the way you normally do. MAKE SURE YOU:  Understand these instructions.  Will watch your condition.  Will get help right away if you are not doing well or get worse.   This information is not intended to replace advice given to you by your health care provider. Make sure you discuss any questions you have with your health care provider.   Document Released: 04/08/2001 Document Revised: 07/19/2013  Document Reviewed: 09/29/2011 Elsevier Interactive Patient Education [employer].

## 2015-10-10 NOTE — ED Provider Notes (Signed)
CSN: [id number]     Arrival date & time 10/10/15  1901 History   First MD Initiated Contact with Patient 10/10/15 2020     Chief Complaint  Patient presents with  . Chest Pain     (Consider location/radiation/quality/duration/timing/severity/associated sxs/prior Treatment) HPI Comments: Martha [name] is a 50 y.o. female who presents today with a 1 week history of cough and shortness of breath and a 1 day history of chest pain. Patient states she "feels like passing out." She describes her chest pain as a cramp, transient, without radiation. Her shortness of breath is worse on exertion. It began to become worse lying down today. Patient reports Patient has had cough and congestion for 1 week. PCP called in prescription for doxycycline on 3/13. Patient reports that she has not been able to taste as normal. Has had decreased appetite and fluid intake. Patient reports cold chills and feeling warm, but no documented fever. Has felt nausea, but no vomiting. Also denies abdominal pain, pleuritic chest pain.   Patient is a 50 y.o. female presenting with chest pain. The history is provided by the patient.  Chest Pain Associated symptoms: cough and shortness of breath   Associated symptoms: no abdominal pain, no diaphoresis, no fever, no headache, no nausea and not vomiting     Past Medical History  Diagnosis Date  . Diabetes mellitus   . Hypertension   . RLS (restless legs syndrome)   . Acid reflux   . Bipolar 1 disorder ([place])   . Depression   . OSA (obstructive sleep apnea)     cpap  . Anemia   . Arthritis   . Degenerative disc disease, thoracic   . Shortness of breath   . Obesity (BMI 30-39.9)   . COPD (chronic obstructive pulmonary disease) ([place]) 2014    bronchitis  . Headache   . Cervical radiculopathy   . Noncompliance with medication regimen    Past Surgical History  Procedure Laterality Date  . Tubal ligation    . Endometrial ablation    . Colonoscopy  06/06/2010     [id number] bleeding secondary to internal hemorrhoids but incomplete evaluation secondary to poor right colon prep/small rectal and sigmoid colon polyps (hyperplastic). PROPOFOL  . Esophagogastroduodenoscopy  06/06/2010    [id number] erythema and edema of body of stomach, with sessile polypoid lesions. bx benign. no h.pylori  . Colonoscopy  May 2013    Dr. Gilliam/[facility]: 5 mm a descending colon polyp, hyperplastic. Adequate bowel prep.  . Flexible sigmoidoscopy N/A 05/17/2013    Dr. Oneida Alar: moderate sized internal hemorrhoids  . Esophagogastroduodenoscopy (egd) with propofol N/A 05/17/2013    Dr. Oneida Alar: normal esophagus, moderate nodular gastritis, negative path, empiric Savary dilation  . Savory dilation N/A 05/17/2013    Procedure: SAVORY DILATION;  Surgeon: [name], MD;  Location: [facility];  Service: Endoscopy;  Laterality: N/A;  14/15/16  . Hemorrhoid banding N/A 05/17/2013    Procedure: HEMORRHOID BANDING;  Surgeon: [name], MD;  Location: [facility];  Service: Endoscopy;  Laterality: N/A;  2 bands placed  . Biopsy N/A 05/17/2013    Procedure: BIOPSY;  Surgeon: [name], MD;  Location: [facility];  Service: Endoscopy;  Laterality: N/A;  . Irrigation and debridement abscess Right 06/01/2013    Procedure: INCISION AND DRAINAGE AND DEBRIDEMENT ABSCESS RIGHT BREAST;  Surgeon: [name], MD;  Location: [facility];  Service: General;  Laterality: Right;  . Incision and drainage abscess Left 10/11/2013  Procedure: INCISION AND DRAINAGE AND DEBRIDEMENT LEFT BREAST  ABSCESS;  Surgeon: [name], MD;  Location: [facility];  Service: General;  Laterality: Left;  . Cholecystectomy N/A 04/27/2014    Procedure: LAPAROSCOPIC CHOLECYSTECTOMY;  Surgeon: [name], MD;  Location: [facility];  Service: General;  Laterality: N/A;   Family History  Problem Relation Age of Onset  . Adopted: Yes  . Family history unknown: Yes   Social History  Substance Use Topics  . Smoking  status: Former Smoker -- 1.50 packs/day for 25 years    Types: Cigarettes    Quit date: 10/23/2007  . Smokeless tobacco: Former Systems developer     Comment: Quit x 6 years ago  . Alcohol Use: No   OB History    Gravida Para Term Preterm AB TAB SAB Ectopic Multiple Living   2 2 1 1      2      Review of Systems  Constitutional: Positive for chills. Negative for fever and diaphoresis.  HENT: Positive for congestion. Negative for facial swelling and sore throat.   Respiratory: Positive for cough and shortness of breath.   Cardiovascular: Positive for chest pain.  Gastrointestinal: Negative for nausea, vomiting and abdominal pain.  Genitourinary: Negative for dysuria.  Musculoskeletal: Positive for myalgias (patient reports L leg cramping).  Skin: Negative for rash and wound.  Neurological: Negative for headaches.  Psychiatric/Behavioral: The patient is not nervous/anxious.       Allergies  Codeine  Home Medications   Prior to Admission medications   Medication Sig Start Date End Date Taking? Authorizing Provider  albuterol (PROVENTIL) (2.5 MG/3ML) 0.083% nebulizer solution Take 2.5 mg by nebulization 4 (four) times daily.    Yes Historical Provider, MD  ALPRAZolam Duanne Moron) 1 MG tablet Take 1 mg by mouth 4 (four) times daily.     Yes Historical Provider, MD  amLODipine (NORVASC) 10 MG tablet Take 10 mg by mouth daily.   Yes Historical Provider, MD  aspirin-sod bicarb-citric acid (ALKA-SELTZER) 325 MG TBEF tablet Take 325 mg by mouth every 6 (six) hours as needed (cold).   Yes Historical Provider, MD  celecoxib (CELEBREX) 100 MG capsule Take 1 capsule (100 mg total) by mouth 2 (two) times daily. 07/23/14  Yes Lily Kocher, PA-C  diclofenac (VOLTAREN) 75 MG EC tablet Take 1 tablet (75 mg total) by mouth 2 (two) times daily with a meal. 10/17/14  Yes Carole Civil, MD  dicyclomine (BENTYL) 10 MG capsule Take 1 capsule (10 mg total) by mouth 4 (four) times daily -  before meals and at  bedtime. 02/09/14  Yes Orvil Feil, NP  DULoxetine (CYMBALTA) 60 MG capsule Take 60 mg by mouth daily.   Yes Historical Provider, MD  esomeprazole (NEXIUM) 40 MG capsule Take 40 mg by mouth every morning.   Yes Historical Provider, MD  gabapentin (NEURONTIN) 300 MG capsule Take 300 mg by mouth 3 (three) times daily.   Yes Historical Provider, MD  HYDROcodone-acetaminophen (NORCO) 5-325 MG per tablet Take 1 tablet by mouth every 4 (four) hours as needed for moderate pain. [id number]  Yes Delora Fuel, MD  ibuprofen (ADVIL,MOTRIN) 800 MG tablet Take 1 tablet (800 mg total) by mouth 3 (three) times daily. Patient taking differently: Take 800 mg by mouth every 8 (eight) hours as needed for moderate pain.  06/10/15  Yes Noemi Chapel, MD  insulin aspart protamine- aspart (NOVOLOG MIX 70/30) (70-30) 100 UNIT/ML injection Inject 1-25 Units into the skin 2 (two) times daily.  Per sliding scale-patient sliding scale   Yes Historical Provider, MD  insulin glargine (LANTUS) 100 UNIT/ML injection Inject 80 Units into the skin 2 (two) times daily.    Yes Historical Provider, MD  lisinopril-hydrochlorothiazide (PRINZIDE,ZESTORETIC) 20-12.5 MG per tablet Take 1 tablet by mouth 2 (two) times daily.    Yes Historical Provider, MD  meloxicam (MOBIC) 15 MG tablet Take 15 mg by mouth daily.   Yes Historical Provider, MD  metFORMIN (GLUCOPHAGE) 1000 MG tablet Take 1,000 mg by mouth 2 (two) times daily with a meal.   Yes Historical Provider, MD  Potassium (POTASSIMIN PO) Take 1 tablet by mouth daily. OTC   Yes Historical Provider, MD  rOPINIRole (REQUIP) 1 MG tablet Take 1 mg by mouth at bedtime.    Yes Historical Provider, MD  simvastatin (ZOCOR) 40 MG tablet Take 40 mg by mouth every evening.   Yes Historical Provider, MD  benzonatate (TESSALON PERLES) 100 MG capsule Take 1 capsule (100 mg total) by mouth 3 (three) times daily as needed for cough. 10/10/15   Frederica Kuster, PA-C  meclizine (ANTIVERT) 25 MG tablet Take 1 tablet  (25 mg total) by mouth 3 (three) times daily as needed for dizziness. Patient not taking: Reported on 09/04/2015 06/21/15   Francine Graven, DO   BP 153/94 mmHg  Pulse 79  Temp(Src) 97.9 F (36.6 C) (Oral)  Resp 19  Ht 5\' 7"  (1.702 m)  Wt 161.027 kg  BMI 55.59 kg/m2  SpO2 98% Physical Exam  Constitutional: She is oriented to person, place, and time. She appears well-developed and well-nourished. No distress.  HENT:  Head: Normocephalic and atraumatic.  Eyes: Conjunctivae are normal. Right eye exhibits no discharge. Left eye exhibits no discharge. No scleral icterus.  Neck: Normal range of motion. Neck supple. No thyromegaly present.  Cardiovascular: Normal rate, regular rhythm and normal heart sounds.  Exam reveals no gallop and no friction rub.   No murmur heard. Pulmonary/Chest: Effort normal and breath sounds normal. No stridor. No respiratory distress. She has no wheezes. She has no rales.  Abdominal: Soft. Bowel sounds are normal. She exhibits no distension. There is no tenderness. There is no rebound and no guarding.  Musculoskeletal: She exhibits no edema or tenderness.  Lymphadenopathy:    She has no cervical adenopathy.  Neurological: She is alert and oriented to person, place, and time. Coordination normal.  Skin: Skin is warm and dry. No rash noted. She is not diaphoretic. No pallor.  Psychiatric: She has a normal mood and affect.  Nursing note and vitals reviewed.   ED Course  Procedures (including critical care time) Labs Review Labs Reviewed  COMPREHENSIVE METABOLIC PANEL - Abnormal; Notable for the following:    Chloride 98 (*)    Glucose, Bld 351 (*)    BUN 25 (*)    Creatinine, Ser 2.10 (*)    Calcium 8.8 (*)    Total Protein 6.1 (*)    Albumin 2.7 (*)    GFR calc non Af Amer 27 (*)    GFR calc Af Amer 31 (*)    All other components within normal limits  BRAIN NATRIURETIC PEPTIDE - Abnormal; Notable for the following:    B Natriuretic Peptide 126.0 (*)     All other components within normal limits  URINALYSIS, ROUTINE W REFLEX MICROSCOPIC (NOT AT [facility]) - Abnormal; Notable for the following:    Glucose, UA >1000 (*)    Hgb urine dipstick TRACE (*)    Protein, ur >  300 (*)    All other components within normal limits  URINE MICROSCOPIC-ADD ON - Abnormal; Notable for the following:    Squamous Epithelial / LPF TOO NUMEROUS TO COUNT (*)    Bacteria, UA MANY (*)    All other components within normal limits  CBC  TROPONIN I  D-DIMER, QUANTITATIVE (NOT AT [facility])  Randolm Idol, ED    Imaging Review Dg Chest 2 View  10/10/2015  CLINICAL DATA:  Acute onset of central chest pain and shortness of breath. Chest pressure. Initial encounter. EXAM: CHEST  2 VIEW COMPARISON:  Chest radiograph performed 10/05/2015 FINDINGS: The lungs are well-aerated. Pulmonary vascularity is at the upper limits of normal. There is no evidence of focal opacification, pleural effusion or pneumothorax. The heart is normal in size; the mediastinal contour is within normal limits. No acute osseous abnormalities are seen. IMPRESSION: No acute cardiopulmonary process seen. Electronically Signed   By: Garald Balding M.D.   On: 10/10/2015 19:23   I have personally reviewed and evaluated these images and lab results as part of my medical decision-making.   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 female who presents today with a 1 week history of cough and shortness of breath and a 1 day history of chest pain. Patient also complained of L leg cramping. EKG non-ischemic, NSR. CXR unremarkable, no tachy.  Trop negative. D-Dimer 0.28. CBC WNL. CMP elevated glucose, BUN, Cr. Low protein, albumin. All at patient's baseline. BNP mildly elevated. Albuterol neb given, SOB improved. Suspect bronchitis. Discharge with cough medicine. Plan to follow up with family doctor for further evaluation of symptoms and continued treatment of diabetes and HTN. Advised to keep control of her blood sugar and have good dietary choices. Patient evaluated by Dr. Sabra Heck who agrees with the plan. Patient understands and agrees with the plan.   Final diagnoses:  Cough  Shortness of breath  Bronchospasm  Chest pain, unspecified chest pain type       Frederica Kuster, PA-C 10/10/15 [place], MD 10/12/15 [phone]

## 2015-10-10 NOTE — ED Notes (Signed)
Pt reports that she takes "most of" her insulin and does not aways take her metformin as the pill os too big

## 2015-10-10 NOTE — ED Notes (Signed)
Pt c/o chest pain and sob that started today.

## 2015-10-10 NOTE — ED Notes (Signed)
Pt is morbidly obese diabetic who reports her blood sugar checked 4 x daily with readings of 400-500 range. She reports that she does not have a diabetic doctor because she does not like the ones here. Education by this nurse regarding diet of low carb, protein, and dangers of unchecked blood sugars including stroke, renal failure, heart disease and neuropathy. She reports she has heard all of this before and is aware.

## 2015-10-10 NOTE — ED Provider Notes (Signed)
The patient is a 50 year old female, she is morbidly obese, she does have diabetes and hypertension, she states that she has been coughing for over a week, she has been having no phlegm, it is a dry cough, severe nasal congestion and a bit of a sore throat. On exam the patient is not tachycardic, she has clear lungs, she has a soft nontender abdomen but is morbidly obese. She speaks in full sentences, she does sound congested in the nose. The oropharynx is clear. She has been getting albuterol treatments and feeling well here. Her chest x-ray is negative, her labs do not show any significant signs of pneumonia or blood clot or cardiac abnormalities. EKG is unremarkable, the patient was informed of all of these results, she will go home with a cough medication and close follow-up with her family doctor. She will be given strict instructions on keeping her blood sugar under good control, good dietary choices and close follow-up with family doctor and she is agreeable to this plan.  Medical screening examination/treatment/procedure(s) were conducted as a shared visit with non-physician practitioner(s) and myself.  I personally evaluated the patient during the encounter.  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

## 2015-11-08 ENCOUNTER — Other Ambulatory Visit: Payer: Self-pay | Admitting: Pulmonary Disease

## 2015-11-08 DIAGNOSIS — M542 Cervicalgia: Principal | ICD-10-CM

## 2015-11-08 DIAGNOSIS — G8929 Other chronic pain: Secondary | ICD-10-CM

## 2015-11-19 ENCOUNTER — Other Ambulatory Visit: Payer: Medicaid Other

## 2015-11-20 ENCOUNTER — Emergency Department (HOSPITAL_COMMUNITY)
Admission: EM | Admit: 2015-11-20 | Discharge: 2015-11-20 | Disposition: A | Discharge status: Home / Self Care | Payer: Medicaid Other | Attending: Emergency Medicine | Admitting: Emergency Medicine

## 2015-11-20 ENCOUNTER — Encounter (HOSPITAL_COMMUNITY): Payer: Self-pay | Admitting: *Deleted

## 2015-11-20 DIAGNOSIS — R51 Headache: Secondary | ICD-10-CM | POA: Diagnosis not present

## 2015-11-20 DIAGNOSIS — L0201 Cutaneous abscess of face: Secondary | ICD-10-CM

## 2015-11-20 DIAGNOSIS — F329 Major depressive disorder, single episode, unspecified: Secondary | ICD-10-CM | POA: Diagnosis not present

## 2015-11-20 DIAGNOSIS — R109 Unspecified abdominal pain: Secondary | ICD-10-CM | POA: Insufficient documentation

## 2015-11-20 DIAGNOSIS — Z7984 Long term (current) use of oral hypoglycemic drugs: Secondary | ICD-10-CM | POA: Diagnosis not present

## 2015-11-20 DIAGNOSIS — E669 Obesity, unspecified: Secondary | ICD-10-CM | POA: Diagnosis not present

## 2015-11-20 DIAGNOSIS — R739 Hyperglycemia, unspecified: Secondary | ICD-10-CM

## 2015-11-20 DIAGNOSIS — Z7982 Long term (current) use of aspirin: Secondary | ICD-10-CM | POA: Insufficient documentation

## 2015-11-20 DIAGNOSIS — J449 Chronic obstructive pulmonary disease, unspecified: Secondary | ICD-10-CM | POA: Diagnosis not present

## 2015-11-20 DIAGNOSIS — E1165 Type 2 diabetes mellitus with hyperglycemia: Secondary | ICD-10-CM | POA: Diagnosis not present

## 2015-11-20 DIAGNOSIS — I10 Essential (primary) hypertension: Secondary | ICD-10-CM | POA: Insufficient documentation

## 2015-11-20 DIAGNOSIS — Z794 Long term (current) use of insulin: Secondary | ICD-10-CM | POA: Insufficient documentation

## 2015-11-20 DIAGNOSIS — J3489 Other specified disorders of nose and nasal sinuses: Secondary | ICD-10-CM | POA: Insufficient documentation

## 2015-11-20 DIAGNOSIS — G8929 Other chronic pain: Secondary | ICD-10-CM | POA: Diagnosis not present

## 2015-11-20 DIAGNOSIS — M549 Dorsalgia, unspecified: Secondary | ICD-10-CM | POA: Insufficient documentation

## 2015-11-20 DIAGNOSIS — Z87891 Personal history of nicotine dependence: Secondary | ICD-10-CM | POA: Insufficient documentation

## 2015-11-20 LAB — BASIC METABOLIC PANEL
ANION GAP: 8 (ref 5–15)
BUN: 23 mg/dL — ABNORMAL HIGH (ref 6–20)
CHLORIDE: 101 mmol/L (ref 101–111)
CO2: 25 mmol/L (ref 22–32)
Calcium: 8.8 mg/dL — ABNORMAL LOW (ref 8.9–10.3)
Creatinine, Ser: 2.35 mg/dL — ABNORMAL HIGH (ref 0.44–1.00)
GFR calc non Af Amer: 23 mL/min — ABNORMAL LOW (ref >60)
GFR, EST AFRICAN AMERICAN: 27 mL/min — AB (ref >60)
Glucose, Bld: 589 mg/dL (ref 65–99)
Potassium: 3.4 mmol/L — ABNORMAL LOW (ref 3.5–5.1)
SODIUM: 134 mmol/L — AB (ref 135–145)

## 2015-11-20 LAB — CBC WITH DIFFERENTIAL/PLATELET
BASOS ABS: 0 10*3/uL (ref 0.0–0.1)
BASOS PCT: 0 %
EOS ABS: 0.1 10*3/uL (ref 0.0–0.7)
Eosinophils Relative: 1 %
HEMATOCRIT: 37.5 % (ref 36.0–46.0)
HEMOGLOBIN: 13.1 g/dL (ref 12.0–15.0)
Lymphocytes Relative: 23 %
Lymphs Abs: 1.8 10*3/uL (ref 0.7–4.0)
MCH: 28.1 pg (ref 26.0–34.0)
MCHC: 34.9 g/dL (ref 30.0–36.0)
MCV: 80.5 fL (ref 78.0–100.0)
MONOS PCT: 6 %
Monocytes Absolute: 0.5 10*3/uL (ref 0.1–1.0)
NEUTROS ABS: 5.6 10*3/uL (ref 1.7–7.7)
Neutrophils Relative %: 70 %
Platelets: 211 10*3/uL (ref 150–400)
RBC: 4.66 MIL/uL (ref 3.87–5.11)
RDW: 13.6 % (ref 11.5–15.5)
WBC: 8 10*3/uL (ref 4.0–10.5)

## 2015-11-20 LAB — CBG MONITORING, ED
GLUCOSE-CAPILLARY: 508 mg/dL — AB (ref 65–99)
GLUCOSE-CAPILLARY: 389 mg/dL — AB (ref 65–99)

## 2015-11-20 MED ORDER — SODIUM CHLORIDE 0.9 % IV BOLUS (SEPSIS)
500.0000 mL | Freq: Once | INTRAVENOUS | Status: AC
  Administered 2015-11-20: 1000 mL via INTRAVENOUS

## 2015-11-20 MED ORDER — FENTANYL CITRATE (PF) 100 MCG/2ML IJ SOLN
50.0000 ug | Freq: Once | INTRAMUSCULAR | Status: AC
  Administered 2015-11-20: 50 ug via INTRAVENOUS
  Filled 2015-11-20: qty 2

## 2015-11-20 MED ORDER — HYDROCODONE-ACETAMINOPHEN 5-325 MG PO TABS: 1.0000 | ORAL_TABLET | Freq: Four times a day (QID) | ORAL | Status: DC | PRN

## 2015-11-20 MED ORDER — ONDANSETRON HCL 4 MG/2ML IJ SOLN
INTRAMUSCULAR | Status: AC
  Filled 2015-11-20: qty 2

## 2015-11-20 MED ORDER — ONDANSETRON HCL 4 MG/2ML IJ SOLN
4.0000 mg | Freq: Once | INTRAMUSCULAR | Status: AC
  Administered 2015-11-20: 4 mg via INTRAVENOUS
  Filled 2015-11-20: qty 2

## 2015-11-20 MED ORDER — INSULIN ASPART 100 UNIT/ML ~~LOC~~ SOLN
10.0000 [IU] | Freq: Once | SUBCUTANEOUS | Status: AC
  Administered 2015-11-20: 10 [IU] via INTRAVENOUS
  Filled 2015-11-20: qty 1

## 2015-11-20 MED ORDER — VANCOMYCIN HCL IN DEXTROSE 1-5 GM/200ML-% IV SOLN
1000.0000 mg | Freq: Once | INTRAVENOUS | Status: AC
  Administered 2015-11-20: 1000 mg via INTRAVENOUS
  Filled 2015-11-20: qty 200

## 2015-11-20 MED ORDER — DOXYCYCLINE HYCLATE 100 MG PO CAPS: 100.0000 mg | ORAL_CAPSULE | Freq: Two times a day (BID) | ORAL | Status: DC

## 2015-11-20 MED ORDER — SODIUM CHLORIDE 0.9 % IV SOLN: INTRAVENOUS | Status: DC

## 2015-11-20 NOTE — ED Notes (Signed)
Pt c/o red area to right side of face x 2-3 days; area is red and swollen and has a scabbed area in middle; pt states she has been using warm compresses with no relief

## 2015-11-20 NOTE — ED Notes (Signed)
cbg of 508.

## 2015-11-20 NOTE — Discharge Instructions (Signed)
Take antibiotic doxycycline as directed for the next 7 days. Continue use the warm compresses on the face. Take hydrocodone as needed for pain. Return for any new or worse symptoms. Make an appointment to follow-up with your regular doctor. Blood sugars were poorly controlled here today make an appointment to follow-up with her regular doctor to ensure blood sugar control.

## 2015-11-20 NOTE — ED Provider Notes (Signed)
CSN: [id number]     Arrival date & time 11/20/15  2023 History  By signing my name below, I, Rowan Blase, attest that this documentation has been prepared under the direction and in the presence of Fredia Sorrow, MD . Electronically Signed: Rowan Blase, Scribe. 11/20/2015. 9:03 PM.   Chief Complaint  Patient presents with  . Abscess   The history is provided by the patient. No language interpreter was used.   HPI Comments:  Martha [name] is a 50 y.o. female with PMHx of DM, HTN and COPD who presents to the Emergency Department complaining of swollen, erythematous abscess on right jaw for the past 2 days. She notes h/o boils. She has tried warm compresses without relief. Pt reports associated pain when chewing, lower abdominal pain, chronic back pain, and headache. She notes blood sugar levels of 400-500 measured at home. Pt denies dental pain or h/o bleeding easily.  Past Medical History  Diagnosis Date  . Diabetes mellitus   . Hypertension   . RLS (restless legs syndrome)   . Acid reflux   . Bipolar 1 disorder ([place])   . Depression   . OSA (obstructive sleep apnea)     cpap  . Anemia   . Arthritis   . Degenerative disc disease, thoracic   . Shortness of breath   . Obesity (BMI 30-39.9)   . COPD (chronic obstructive pulmonary disease) ([place]) 2014    bronchitis  . Headache   . Cervical radiculopathy   . Noncompliance with medication regimen    Past Surgical History  Procedure Laterality Date  . Tubal ligation    . Endometrial ablation    . Colonoscopy  06/06/2010    [id number] bleeding secondary to internal hemorrhoids but incomplete evaluation secondary to poor right colon prep/small rectal and sigmoid colon polyps (hyperplastic). PROPOFOL  . Esophagogastroduodenoscopy  06/06/2010    [id number] erythema and edema of body of stomach, with sessile polypoid lesions. bx benign. no h.pylori  . Colonoscopy  May 2013    Dr. Gilliam/[facility]: 5 mm a descending colon polyp,  hyperplastic. Adequate bowel prep.  . Flexible sigmoidoscopy N/A 05/17/2013    Dr. Oneida Alar: moderate sized internal hemorrhoids  . Esophagogastroduodenoscopy (egd) with propofol N/A 05/17/2013    Dr. Oneida Alar: normal esophagus, moderate nodular gastritis, negative path, empiric Savary dilation  . Savory dilation N/A 05/17/2013    Procedure: SAVORY DILATION;  Surgeon: [name], MD;  Location: [facility];  Service: Endoscopy;  Laterality: N/A;  14/15/16  . Hemorrhoid banding N/A 05/17/2013    Procedure: HEMORRHOID BANDING;  Surgeon: [name], MD;  Location: [facility];  Service: Endoscopy;  Laterality: N/A;  2 bands placed  . Biopsy N/A 05/17/2013    Procedure: BIOPSY;  Surgeon: [name], MD;  Location: [facility];  Service: Endoscopy;  Laterality: N/A;  . Irrigation and debridement abscess Right 06/01/2013    Procedure: INCISION AND DRAINAGE AND DEBRIDEMENT ABSCESS RIGHT BREAST;  Surgeon: [name], MD;  Location: [facility];  Service: General;  Laterality: Right;  . Incision and drainage abscess Left 10/11/2013    Procedure: INCISION AND DRAINAGE AND DEBRIDEMENT LEFT BREAST  ABSCESS;  Surgeon: [name], MD;  Location: [facility];  Service: General;  Laterality: Left;  . Cholecystectomy N/A 04/27/2014    Procedure: LAPAROSCOPIC CHOLECYSTECTOMY;  Surgeon: [name], MD;  Location: [facility];  Service: General;  Laterality: N/A;   Family History  Problem Relation Age of Onset  . Adopted: Yes  .  Family history unknown: Yes   Social History  Substance Use Topics  . Smoking status: Former Smoker -- 1.50 packs/day for 25 years    Types: Cigarettes    Quit date: 10/23/2007  . Smokeless tobacco: Former Systems developer     Comment: Quit x 6 years ago  . Alcohol Use: No   OB History    Gravida Para Term Preterm AB TAB SAB Ectopic Multiple Living   2 2 1 1      2      Review of Systems  Constitutional: Positive for chills. Negative for fever.  HENT: Positive for rhinorrhea. Negative  for dental problem and sore throat.   Eyes: Negative for visual disturbance.  Respiratory: Negative for cough and shortness of breath.   Cardiovascular: Negative for chest pain and leg swelling.  Gastrointestinal: Positive for abdominal pain. Negative for nausea, vomiting and diarrhea.  Genitourinary: Negative for dysuria.  Musculoskeletal: Positive for back pain.  Skin: Positive for wound. Negative for rash.  Neurological: Positive for headaches.  Psychiatric/Behavioral: Negative for confusion.   Allergies  Codeine  Home Medications   Prior to Admission medications   Medication Sig Start Date End Date Taking? Authorizing Provider  albuterol (PROVENTIL) (2.5 MG/3ML) 0.083% nebulizer solution Take 2.5 mg by nebulization 4 (four) times daily.    Yes Historical Provider, MD  ALPRAZolam Duanne Moron) 1 MG tablet Take 1 mg by mouth 4 (four) times daily.     Yes Historical Provider, MD  amLODipine (NORVASC) 10 MG tablet Take 10 mg by mouth daily.   Yes Historical Provider, MD  aspirin-sod bicarb-citric acid (ALKA-SELTZER) 325 MG TBEF tablet Take 325 mg by mouth every 6 (six) hours as needed (cold).   Yes Historical Provider, MD  celecoxib (CELEBREX) 100 MG capsule Take 1 capsule (100 mg total) by mouth 2 (two) times daily. 07/23/14  Yes Lily Kocher, PA-C  diclofenac (VOLTAREN) 75 MG EC tablet Take 1 tablet (75 mg total) by mouth 2 (two) times daily with a meal. 10/17/14  Yes Carole Civil, MD  dicyclomine (BENTYL) 10 MG capsule Take 1 capsule (10 mg total) by mouth 4 (four) times daily -  before meals and at bedtime. 02/09/14  Yes Orvil Feil, NP  DULoxetine (CYMBALTA) 60 MG capsule Take 60 mg by mouth daily.   Yes Historical Provider, MD  esomeprazole (NEXIUM) 40 MG capsule Take 40 mg by mouth every morning.   Yes Historical Provider, MD  gabapentin (NEURONTIN) 300 MG capsule Take 300 mg by mouth 3 (three) times daily.   Yes Historical Provider, MD  HYDROcodone-acetaminophen (NORCO) 5-325 MG  per tablet Take 1 tablet by mouth every 4 (four) hours as needed for moderate pain. [id number]  Yes Delora Fuel, MD  ibuprofen (ADVIL,MOTRIN) 800 MG tablet Take 1 tablet (800 mg total) by mouth 3 (three) times daily. Patient taking differently: Take 800 mg by mouth every 8 (eight) hours as needed for moderate pain.  06/10/15  Yes Noemi Chapel, MD  insulin aspart protamine- aspart (NOVOLOG MIX 70/30) (70-30) 100 UNIT/ML injection Inject 1-25 Units into the skin 2 (two) times daily. Per sliding scale-patient sliding scale   Yes Historical Provider, MD  insulin glargine (LANTUS) 100 UNIT/ML injection Inject 80 Units into the skin 2 (two) times daily.    Yes Historical Provider, MD  lisinopril-hydrochlorothiazide (PRINZIDE,ZESTORETIC) 20-12.5 MG per tablet Take 1 tablet by mouth 2 (two) times daily.    Yes Historical Provider, MD  meloxicam (MOBIC) 15 MG tablet Take 15 mg by  mouth daily.   Yes Historical Provider, MD  metFORMIN (GLUCOPHAGE) 1000 MG tablet Take 1,000 mg by mouth 2 (two) times daily with a meal.   Yes Historical Provider, MD  Potassium 99 MG TABS Take 1 tablet by mouth daily.   Yes Historical Provider, MD  rOPINIRole (REQUIP) 1 MG tablet Take 1 mg by mouth at bedtime.    Yes Historical Provider, MD  simvastatin (ZOCOR) 40 MG tablet Take 40 mg by mouth every evening.   Yes Historical Provider, MD  benzonatate (TESSALON PERLES) 100 MG capsule Take 1 capsule (100 mg total) by mouth 3 (three) times daily as needed for cough. Patient not taking: Reported on 11/20/2015 10/10/15   Frederica Kuster, PA-C  doxycycline (VIBRAMYCIN) 100 MG capsule Take 1 capsule (100 mg total) by mouth 2 (two) times daily. 11/20/15   Fredia Sorrow, MD  HYDROcodone-acetaminophen (NORCO/VICODIN) 5-325 MG tablet Take 1-2 tablets by mouth every 6 (six) hours as needed. 11/20/15   Fredia Sorrow, MD   BP 162/87 mmHg  Pulse 79  Temp(Src) 98.3 F (36.8 C)  Resp 17  Ht 5\' 8"  (1.727 m)  Wt 161.027 kg  BMI 53.99 kg/m2  SpO2  98% Physical Exam  Constitutional: She is oriented to person, place, and time. She appears well-developed and well-nourished.  HENT:  Head: Normocephalic.  Mouth/Throat: Oropharynx is clear and moist and mucous membranes are normal.  Tongue and teeth normal  Eyes: EOM are normal. Pupils are equal, round, and reactive to light. No scleral icterus.  Neck: Normal range of motion.  Cardiovascular: Normal rate, regular rhythm and normal heart sounds.   Pulmonary/Chest: Effort normal and breath sounds normal. No respiratory distress. She has no wheezes. She has no rales.  Abdominal: Bowel sounds are normal. She exhibits no distension. There is no tenderness.  Musculoskeletal: Normal range of motion. She exhibits no edema.  Lymphadenopathy:    She has no cervical adenopathy.  Neurological: She is alert and oriented to person, place, and time. No cranial nerve deficit. She exhibits normal muscle tone. Coordination normal.  Skin:  Pt has an area of increased redness 4 cm; 2-78mm area with scab; area of induration measuring 3cm  Psychiatric: She has a normal mood and affect.  Nursing note and vitals reviewed.   ED Course  Procedures  DIAGNOSTIC STUDIES:  Oxygen Saturation is 100% on RA, normal by my interpretation.    COORDINATION OF CARE:  9:00 PM Will administer IV antibiotics and discharge with prescription for Doxycycline. Recommended pt continue to apply warm compresses. Discussed treatment plan with pt at bedside and pt agreed to plan.  Labs Review Labs Reviewed  BASIC METABOLIC PANEL - Abnormal; Notable for the following:    Sodium 134 (*)    Potassium 3.4 (*)    Glucose, Bld 589 (*)    BUN 23 (*)    Creatinine, Ser 2.35 (*)    Calcium 8.8 (*)    GFR calc non Af Amer 23 (*)    GFR calc Af Amer 27 (*)    All other components within normal limits  CBG MONITORING, ED - Abnormal; Notable for the following:    Glucose-Capillary 508 (*)    All other components within normal limits   CBG MONITORING, ED - Abnormal; Notable for the following:    Glucose-Capillary 389 (*)    All other components within normal limits  CBC WITH DIFFERENTIAL/PLATELET   Results for orders placed or performed during the hospital encounter of 11/20/15  CBC  with Differential/Platelet  Result Value Ref Range   WBC 8.0 4.0 - 10.5 K/uL   RBC 4.66 3.87 - 5.11 MIL/uL   Hemoglobin 13.1 12.0 - 15.0 g/dL   HCT 37.5 36.0 - 46.0 %   MCV 80.5 78.0 - 100.0 fL   MCH 28.1 26.0 - 34.0 pg   MCHC 34.9 30.0 - 36.0 g/dL   RDW 13.6 11.5 - 15.5 %   Platelets 211 150 - 400 K/uL   Neutrophils Relative % 70 %   Neutro Abs 5.6 1.7 - 7.7 K/uL   Lymphocytes Relative 23 %   Lymphs Abs 1.8 0.7 - 4.0 K/uL   Monocytes Relative 6 %   Monocytes Absolute 0.5 0.1 - 1.0 K/uL   Eosinophils Relative 1 %   Eosinophils Absolute 0.1 0.0 - 0.7 K/uL   Basophils Relative 0 %   Basophils Absolute 0.0 0.0 - 0.1 K/uL  Basic metabolic panel  Result Value Ref Range   Sodium 134 (L) 135 - 145 mmol/L   Potassium 3.4 (L) 3.5 - 5.1 mmol/L   Chloride 101 101 - 111 mmol/L   CO2 25 22 - 32 mmol/L   Glucose, Bld 589 (HH) 65 - 99 mg/dL   BUN 23 (H) 6 - 20 mg/dL   Creatinine, Ser 2.35 (H) 0.44 - 1.00 mg/dL   Calcium 8.8 (L) 8.9 - 10.3 mg/dL   GFR calc non Af Amer 23 (L) >60 mL/min   GFR calc Af Amer 27 (L) >60 mL/min   Anion gap 8 5 - 15  POC CBG, ED  Result Value Ref Range   Glucose-Capillary 508 (H) 65 - 99 mg/dL  POC CBG, ED  Result Value Ref Range   Glucose-Capillary 389 (H) 65 - 99 mg/dL     Imaging Review No results found. I have personally reviewed and evaluated these images and lab results as part of my medical decision-making.   EKG Interpretation None      MDM   Final diagnoses:  Facial abscess  Hyperglycemia    Patient right mandible jaw area with area of developing abscess without fluctuance. Will treat with antibiotics. Patient received 1 dose of vancomycin here. Will be continued on doxycycline.  Patient has a known history of diabetes. Stated the blood sugars at home and running in the 400s. Here it was in the upper 500s. Patient given 10 units of insulin blood sugar came down to 389. No evidence diabetic ketoacidosis. No fevers. Patient nontoxic no acute distress. Patient will follow-up with her record Dr. for better blood sugar control and will continue the doxycycline for the next 7 days.  I personally performed the services described in this documentation, which was scribed in my presence. The recorded information has been reviewed and is accurate.      Fredia Sorrow, MD 11/20/15 [id number]

## 2015-11-27 ENCOUNTER — Ambulatory Visit
Admission: RE | Admit: 2015-11-27 | Discharge: 2015-11-27 | Disposition: A | Discharge status: Home / Self Care | Payer: Medicaid Other | Source: Ambulatory Visit | Attending: Pulmonary Disease | Admitting: Pulmonary Disease

## 2015-11-27 DIAGNOSIS — M542 Cervicalgia: Principal | ICD-10-CM

## 2015-11-27 DIAGNOSIS — G8929 Other chronic pain: Secondary | ICD-10-CM

## 2015-11-27 MED ORDER — IOPAMIDOL (ISOVUE-300) INJECTION 61%: 1.0000 mL | Freq: Once | INTRAVENOUS | Status: DC | PRN

## 2015-11-27 MED ORDER — TRIAMCINOLONE ACETONIDE 40 MG/ML IJ SUSP (RADIOLOGY): 60.0000 mg | Freq: Once | INTRAMUSCULAR | Status: DC

## 2015-11-27 NOTE — Discharge Instructions (Signed)

## 2016-01-16 ENCOUNTER — Emergency Department (HOSPITAL_COMMUNITY)
Admission: EM | Admit: 2016-01-16 | Discharge: 2016-01-16 | Disposition: A | Discharge status: Home / Self Care | Payer: Medicaid Other | Attending: Emergency Medicine | Admitting: Emergency Medicine

## 2016-01-16 ENCOUNTER — Encounter (HOSPITAL_COMMUNITY): Payer: Self-pay

## 2016-01-16 DIAGNOSIS — L02411 Cutaneous abscess of right axilla: Secondary | ICD-10-CM | POA: Insufficient documentation

## 2016-01-16 DIAGNOSIS — I10 Essential (primary) hypertension: Secondary | ICD-10-CM | POA: Diagnosis not present

## 2016-01-16 DIAGNOSIS — Z7984 Long term (current) use of oral hypoglycemic drugs: Secondary | ICD-10-CM | POA: Insufficient documentation

## 2016-01-16 DIAGNOSIS — J449 Chronic obstructive pulmonary disease, unspecified: Secondary | ICD-10-CM | POA: Insufficient documentation

## 2016-01-16 DIAGNOSIS — Z79899 Other long term (current) drug therapy: Secondary | ICD-10-CM | POA: Diagnosis not present

## 2016-01-16 DIAGNOSIS — Z87891 Personal history of nicotine dependence: Secondary | ICD-10-CM | POA: Diagnosis not present

## 2016-01-16 DIAGNOSIS — E1165 Type 2 diabetes mellitus with hyperglycemia: Secondary | ICD-10-CM | POA: Diagnosis not present

## 2016-01-16 DIAGNOSIS — Z794 Long term (current) use of insulin: Secondary | ICD-10-CM | POA: Diagnosis not present

## 2016-01-16 DIAGNOSIS — F319 Bipolar disorder, unspecified: Secondary | ICD-10-CM | POA: Diagnosis not present

## 2016-01-16 DIAGNOSIS — L0291 Cutaneous abscess, unspecified: Secondary | ICD-10-CM

## 2016-01-16 DIAGNOSIS — R739 Hyperglycemia, unspecified: Secondary | ICD-10-CM

## 2016-01-16 LAB — BASIC METABOLIC PANEL
Anion gap: 6 (ref 5–15)
BUN: 30 mg/dL — ABNORMAL HIGH (ref 6–20)
CALCIUM: 8.6 mg/dL — AB (ref 8.9–10.3)
CHLORIDE: 100 mmol/L — AB (ref 101–111)
CO2: 26 mmol/L (ref 22–32)
CREATININE: 2.77 mg/dL — AB (ref 0.44–1.00)
GFR calc Af Amer: 22 mL/min — ABNORMAL LOW (ref >60)
GFR calc non Af Amer: 19 mL/min — ABNORMAL LOW (ref >60)
GLUCOSE: 484 mg/dL — AB (ref 65–99)
Potassium: 3.8 mmol/L (ref 3.5–5.1)
Sodium: 132 mmol/L — ABNORMAL LOW (ref 135–145)

## 2016-01-16 LAB — CBG MONITORING, ED
Glucose-Capillary: 467 mg/dL — ABNORMAL HIGH (ref 65–99)
Glucose-Capillary: 400 mg/dL — ABNORMAL HIGH (ref 65–99)
Glucose-Capillary: 425 mg/dL — ABNORMAL HIGH (ref 65–99)

## 2016-01-16 LAB — CBC WITH DIFFERENTIAL/PLATELET
Basophils Absolute: 0 10*3/uL (ref 0.0–0.1)
Basophils Relative: 0 %
Eosinophils Absolute: 0.1 10*3/uL (ref 0.0–0.7)
Eosinophils Relative: 1 %
HEMATOCRIT: 39.4 % (ref 36.0–46.0)
HEMOGLOBIN: 13.2 g/dL (ref 12.0–15.0)
LYMPHS ABS: 2.6 10*3/uL (ref 0.7–4.0)
LYMPHS PCT: 30 %
MCH: 27.8 pg (ref 26.0–34.0)
MCHC: 33.5 g/dL (ref 30.0–36.0)
MCV: 82.9 fL (ref 78.0–100.0)
MONO ABS: 0.6 10*3/uL (ref 0.1–1.0)
MONOS PCT: 7 %
NEUTROS ABS: 5.4 10*3/uL (ref 1.7–7.7)
Neutrophils Relative %: 62 %
Platelets: 233 10*3/uL (ref 150–400)
RBC: 4.75 MIL/uL (ref 3.87–5.11)
RDW: 13.8 % (ref 11.5–15.5)
WBC: 8.8 10*3/uL (ref 4.0–10.5)

## 2016-01-16 MED ORDER — CEPHALEXIN 500 MG PO CAPS: 500.0000 mg | ORAL_CAPSULE | Freq: Four times a day (QID) | ORAL | Status: DC

## 2016-01-16 MED ORDER — INSULIN ASPART 100 UNIT/ML ~~LOC~~ SOLN
10.0000 [IU] | Freq: Once | SUBCUTANEOUS | Status: AC
  Administered 2016-01-16: 10 [IU] via SUBCUTANEOUS

## 2016-01-16 MED ORDER — SODIUM CHLORIDE 0.9 % IV BOLUS (SEPSIS): 1000.0000 mL | Freq: Once | INTRAVENOUS | Status: DC

## 2016-01-16 MED ORDER — LIDOCAINE-EPINEPHRINE (PF) 2 %-1:200000 IJ SOLN
10.0000 mL | Freq: Once | INTRAMUSCULAR | Status: AC
  Administered 2016-01-16: 10 mL
  Filled 2016-01-16: qty 20

## 2016-01-16 MED ORDER — SODIUM CHLORIDE 0.9 % IV BOLUS (SEPSIS)
1000.0000 mL | Freq: Once | INTRAVENOUS | Status: AC
  Administered 2016-01-16: 1000 mL via INTRAVENOUS

## 2016-01-16 MED ORDER — INSULIN ASPART 100 UNIT/ML ~~LOC~~ SOLN
10.0000 [IU] | Freq: Once | SUBCUTANEOUS | Status: AC
  Administered 2016-01-16: 10 [IU] via SUBCUTANEOUS
  Filled 2016-01-16: qty 1

## 2016-01-16 MED ORDER — POVIDONE-IODINE 10 % EX SOLN
CUTANEOUS | Status: AC
  Filled 2016-01-16: qty 118

## 2016-01-16 MED ORDER — DOXYCYCLINE HYCLATE 100 MG PO CAPS: 100.0000 mg | ORAL_CAPSULE | Freq: Two times a day (BID) | ORAL | Status: DC

## 2016-01-16 NOTE — Discharge Instructions (Signed)
Remove the packing daily and replace. If you note worsening pain, redness, fevers, or chills, seek care. Follow up with your primary care doctor in the next few days. Take your Novolog 70/30 when you get home! You should talk to your PCP about your continued elevated blood sugars.  Incision and Drainage, Care After Refer to this sheet in the next few weeks. These instructions provide you with information on caring for yourself after your procedure. Your caregiver may also give you more specific instructions. Your treatment has been planned according to current medical practices, but problems sometimes occur. Call your caregiver if you have any problems or questions after your procedure. HOME CARE INSTRUCTIONS   If antibiotic medicine is given, take it as directed. Finish it even if you start to feel better.  Only take over-the-counter or prescription medicines for pain, discomfort, or fever as directed by your caregiver.  Keep all follow-up appointments as directed by your caregiver.  Change any bandages (dressings) as directed by your caregiver. Replace old dressings with clean dressings.  Wash your hands before and after caring for your wound. You will receive specific instructions for cleansing and caring for your wound.  SEEK MEDICAL CARE IF:   You have increased pain, swelling, or redness around the wound.  You have increased drainage, smell, or bleeding from the wound.  You have muscle aches, chills, or you feel generally sick.  You have a fever. MAKE SURE YOU:   Understand these instructions.  Will watch your condition.  Will get help right away if you are not doing well or get worse.   This information is not intended to replace advice given to you by your health care provider. Make sure you discuss any questions you have with your health care provider.   Document Released: 10/06/2011 Document Revised: 08/04/2014 Document Reviewed: 10/06/2011 Elsevier Interactive  Patient Education [employer].

## 2016-01-16 NOTE — ED Notes (Signed)
Patient eating at this time. CBG 400

## 2016-01-16 NOTE — ED Provider Notes (Signed)
CSN: [id number]     Arrival date & time 01/16/16  1744 History   First MD Initiated Contact with Patient 01/16/16 1931     Chief Complaint  Patient presents with  . Abscess     (Consider location/radiation/quality/duration/timing/severity/associated sxs/prior Treatment) HPI  [name] is a 50 year old female with past medical history of diabetes, hypertension, restless leg syndrome, bipolar 1 disorder, depression, OSA, anemia, COPD presenting with concerns of an infected boil.  The patient notes that she gets boils very easily. She noted a boil under her right axilla yesterday. Yesterday evening she started having significant swelling and pain. She noticed erythema and warmth this morning.  She endorses some drainage of fluid since being in the emergency room. She denies any fevers or chills. She has had a poor appetite due to the pain.  Her blood sugar is significantly elevated here in the emergency room. She states that she has not taken any of her insulin she's had poor by mouth intake.  Her average CBGs range in the 300 to 400s.    Past Medical History  Diagnosis Date  . Diabetes mellitus   . Hypertension   . RLS (restless legs syndrome)   . Acid reflux   . Bipolar 1 disorder ([place])   . Depression   . OSA (obstructive sleep apnea)     cpap  . Anemia   . Arthritis   . Degenerative disc disease, thoracic   . Shortness of breath   . Obesity (BMI 30-39.9)   . COPD (chronic obstructive pulmonary disease) ([place]) 2014    bronchitis  . Headache   . Cervical radiculopathy   . Noncompliance with medication regimen    Past Surgical History  Procedure Laterality Date  . Tubal ligation    . Endometrial ablation    . Colonoscopy  06/06/2010    [id number] bleeding secondary to internal hemorrhoids but incomplete evaluation secondary to poor right colon prep/small rectal and sigmoid colon polyps (hyperplastic). PROPOFOL  . Esophagogastroduodenoscopy  06/06/2010    [id number]  erythema and edema of body of stomach, with sessile polypoid lesions. bx benign. no h.pylori  . Colonoscopy  May 2013    Dr. Gilliam/[facility]: 5 mm a descending colon polyp, hyperplastic. Adequate bowel prep.  . Flexible sigmoidoscopy N/A 05/17/2013    Dr. Oneida Alar: moderate sized internal hemorrhoids  . Esophagogastroduodenoscopy (egd) with propofol N/A 05/17/2013    Dr. Oneida Alar: normal esophagus, moderate nodular gastritis, negative path, empiric Savary dilation  . Savory dilation N/A 05/17/2013    Procedure: SAVORY DILATION;  Surgeon: [name], MD;  Location: [facility];  Service: Endoscopy;  Laterality: N/A;  14/15/16  . Hemorrhoid banding N/A 05/17/2013    Procedure: HEMORRHOID BANDING;  Surgeon: [name], MD;  Location: [facility];  Service: Endoscopy;  Laterality: N/A;  2 bands placed  . Biopsy N/A 05/17/2013    Procedure: BIOPSY;  Surgeon: [name], MD;  Location: [facility];  Service: Endoscopy;  Laterality: N/A;  . Irrigation and debridement abscess Right 06/01/2013    Procedure: INCISION AND DRAINAGE AND DEBRIDEMENT ABSCESS RIGHT BREAST;  Surgeon: [name], MD;  Location: [facility];  Service: General;  Laterality: Right;  . Incision and drainage abscess Left 10/11/2013    Procedure: INCISION AND DRAINAGE AND DEBRIDEMENT LEFT BREAST  ABSCESS;  Surgeon: [name], MD;  Location: [facility];  Service: General;  Laterality: Left;  . Cholecystectomy N/A 04/27/2014    Procedure: LAPAROSCOPIC CHOLECYSTECTOMY;  Surgeon: [name],  MD;  Location: [facility];  Service: General;  Laterality: N/A;   Family History  Problem Relation Age of Onset  . Adopted: Yes  . Family history unknown: Yes   Social History  Substance Use Topics  . Smoking status: Former Smoker -- 1.50 packs/day for 25 years    Types: Cigarettes    Quit date: 10/23/2007  . Smokeless tobacco: Former Systems developer     Comment: Quit x 6 years ago  . Alcohol Use: No   OB History    Gravida Para Term Preterm AB TAB  SAB Ectopic Multiple Living   2 2 1 1      2      Review of Systems  Constitutional: Positive for appetite change. Negative for fever, chills, diaphoresis and fatigue.  HENT: Negative.   Eyes: Negative for photophobia, pain, redness and visual disturbance.  Respiratory: Negative for cough, choking, chest tightness, shortness of breath, wheezing and stridor.   Cardiovascular: Negative for chest pain, palpitations and leg swelling.  Gastrointestinal: Negative for nausea, vomiting, abdominal pain, diarrhea, constipation and blood in stool.  Endocrine: Negative.   Genitourinary: Negative.   Musculoskeletal: Negative.   Skin: Positive for rash and wound.  Allergic/Immunologic: Negative.   Neurological: Negative for dizziness, syncope, weakness, light-headedness, numbness and headaches.  Hematological: Negative.   Psychiatric/Behavioral: Negative.       Allergies  Codeine  Home Medications   Prior to Admission medications   Medication Sig Start Date End Date Taking? Authorizing Provider  albuterol (PROVENTIL) (2.5 MG/3ML) 0.083% nebulizer solution Take 2.5 mg by nebulization 4 (four) times daily.    Yes Historical Provider, MD  ALPRAZolam Duanne Moron) 1 MG tablet Take 1 mg by mouth 4 (four) times daily.     Yes Historical Provider, MD  amLODipine (NORVASC) 10 MG tablet Take 10 mg by mouth daily.   Yes Historical Provider, MD  aspirin-sod bicarb-citric acid (ALKA-SELTZER) 325 MG TBEF tablet Take 325 mg by mouth every 6 (six) hours as needed (cold).   Yes Historical Provider, MD  diclofenac (VOLTAREN) 75 MG EC tablet Take 1 tablet (75 mg total) by mouth 2 (two) times daily with a meal. 10/17/14  Yes Carole Civil, MD  dicyclomine (BENTYL) 10 MG capsule Take 1 capsule (10 mg total) by mouth 4 (four) times daily -  before meals and at bedtime. 02/09/14  Yes Orvil Feil, NP  DULoxetine (CYMBALTA) 60 MG capsule Take 60 mg by mouth daily.   Yes Historical Provider, MD  esomeprazole (NEXIUM) 40  MG capsule Take 40 mg by mouth every morning.   Yes Historical Provider, MD  gabapentin (NEURONTIN) 300 MG capsule Take 300 mg by mouth 3 (three) times daily.   Yes Historical Provider, MD  HYDROcodone-acetaminophen (NORCO/VICODIN) 5-325 MG tablet Take 1-2 tablets by mouth every 6 (six) hours as needed. 11/20/15  Yes Fredia Sorrow, MD  insulin aspart protamine- aspart (NOVOLOG MIX 70/30) (70-30) 100 UNIT/ML injection Inject 1-25 Units into the skin 2 (two) times daily. Per sliding scale-patient sliding scale   Yes Historical Provider, MD  insulin glargine (LANTUS) 100 UNIT/ML injection Inject 80 Units into the skin 2 (two) times daily.    Yes Historical Provider, MD  lisinopril-hydrochlorothiazide (PRINZIDE,ZESTORETIC) 20-12.5 MG per tablet Take 1 tablet by mouth 2 (two) times daily.    Yes Historical Provider, MD  meloxicam (MOBIC) 15 MG tablet Take 15 mg by mouth daily.   Yes Historical Provider, MD  metFORMIN (GLUCOPHAGE) 1000 MG tablet Take 1,000 mg by mouth  2 (two) times daily with a meal.   Yes Historical Provider, MD  Potassium 99 MG TABS Take 1 tablet by mouth daily.   Yes Historical Provider, MD  rOPINIRole (REQUIP) 1 MG tablet Take 1 mg by mouth at bedtime.    Yes Historical Provider, MD  simvastatin (ZOCOR) 40 MG tablet Take 40 mg by mouth every evening.   Yes Historical Provider, MD  cephALEXin (KEFLEX) 500 MG capsule Take 1 capsule (500 mg total) by mouth 4 (four) times daily. 01/16/16   Archie Patten, MD  doxycycline (VIBRAMYCIN) 100 MG capsule Take 1 capsule (100 mg total) by mouth 2 (two) times daily. 01/16/16   Archie Patten, MD   BP 187/97 mmHg  Pulse 79  Temp(Src) 98.6 F (37 C) (Oral)  Resp 18  Ht 5\' 8"  (1.727 m)  Wt 158.759 kg  BMI 53.23 kg/m2  SpO2 100% Physical Exam  Constitutional: She is oriented to person, place, and time. She appears well-developed and well-nourished. No distress.  HENT:  Head: Normocephalic.  Mouth/Throat: Oropharynx is clear and moist. No  oropharyngeal exudate.  Eyes: Right eye exhibits no discharge. Left eye exhibits no discharge. No scleral icterus.  Neck: Normal range of motion. Neck supple.  Cardiovascular: Normal rate, regular rhythm, normal heart sounds and intact distal pulses.  Exam reveals no gallop and no friction rub.   No murmur heard. Pulmonary/Chest: Effort normal. No respiratory distress. She has no wheezes. She has no rales.  Abdominal: Soft. Bowel sounds are normal. She exhibits no distension. There is no tenderness. There is no rebound and no guarding.  Musculoskeletal: She exhibits no edema or tenderness.  Neurological: She is alert and oriented to person, place, and time. She exhibits normal muscle tone.  Skin: She is not diaphoretic.  Patient has an approximate 4x1cm area of erythema with a 2x1cm area of induration, and a 1x1cm area of fluctuance. Small amount of brown discharge coming from the area  Psychiatric: She has a normal mood and affect.    ED Course  .Marland KitchenIncision and Drainage Date/Time: 01/16/2016 8:29 PM Performed by: Archie Patten Authorized by: Ezequiel Essex Consent: Verbal consent obtained. Risks and benefits: risks, benefits and alternatives were discussed Consent given by: patient Patient understanding: patient states understanding of the procedure being performed Patient consent: the patient's understanding of the procedure matches consent given Procedure consent: procedure consent matches procedure scheduled Test results: test results available and properly labeled Site marked: the operative site was marked Patient identity confirmed: verbally with patient and arm band Time out: Immediately prior to procedure a "time out" was called to verify the correct patient, procedure, equipment, support staff and site/side marked as required. Type: abscess Body area: upper extremity (Right axilla) Anesthesia: local infiltration Local anesthetic: lidocaine 2% with epinephrine Anesthetic  total: 8 ml Patient sedated: no Scalpel size: 11 Needle gauge: 18 Incision type: single straight Incision depth: subcutaneous Complexity: simple Drainage: purulent Drainage amount: moderate Wound treatment: wound left open Packing material: 1/4 in iodoform gauze Patient tolerance: Patient tolerated the procedure well with no immediate complications   (including critical care time)  9:00pm: patient's CBG still elevated, however received insulin after CBG obtained. Will give 2L NS bolus. From labs, patient NOT in DKA.   10:00pm: Patient's CBG still 400. Will continue with 2nd, 1L NS bolus. Patient eating in the room.   10:30pm:  Patient's CBG 425.  Will give an additional 10 units of Novolog.   11:00pm: patient refuses 2nd NS bolus  and desires to go home. \   Labs Review Labs Reviewed  BASIC METABOLIC PANEL - Abnormal; Notable for the following:    Sodium 132 (*)    Chloride 100 (*)    Glucose, Bld 484 (*)    BUN 30 (*)    Creatinine, Ser 2.77 (*)    Calcium 8.6 (*)    GFR calc non Af Amer 19 (*)    GFR calc Af Amer 22 (*)    All other components within normal limits  CBG MONITORING, ED - Abnormal; Notable for the following:    Glucose-Capillary 467 (*)    All other components within normal limits  CBG MONITORING, ED - Abnormal; Notable for the following:    Glucose-Capillary 400 (*)    All other components within normal limits  CBG MONITORING, ED - Abnormal; Notable for the following:    Glucose-Capillary 425 (*)    All other components within normal limits  CBC WITH DIFFERENTIAL/PLATELET    Imaging Review No results found. I have personally reviewed and evaluated these images and lab results as part of my medical decision-making.   EKG Interpretation None      MDM   Final diagnoses:  Abscess  Hyperglycemia    [name] is a 50 year old female with past medical history of type 2 diabetes that is uncontrolled presenting with an abscess in her right  axilla. There is induration, fluctuance, and wants to the area. Through his also some tracking of the erythema as well.  The area was incised and drained, irrigated, and then packed and dressedShe is very evaluated comes to management of post -I&D care. Because there is an abscess formation in addition to some tracking, I will cover for both strep and staph with Keflex and doxycycline respectively. These were called into her pharmacy, the patient was notified of this by myself. We discussed return precautions.   She was also noted to be hyperglycemic on presentation. He notes that this is similar to where she's been in the past. She has not taking any of her insulin today. Labs were obtained, she was not in DKA. She received a total of 20 units of NovoLog during her stay in the ED. She did not wish to stay off it to the second dose of insulin in for her second bolus of normal saline. On discharge, her CBG was 425, however she did not want to stay until we were able to get it down further. We discussed taking her NovoLog 70/30 when she got home.  Advised the patient to follow-up with her PCP in the next few days for both the abscess and for diabetes management.      Archie Patten, MD 01/16/16 2352  Ezequiel Essex, MD 01/17/16 [phone]

## 2016-01-16 NOTE — ED Notes (Addendum)
Pt reports that she has a boil right axilla for 2 days. CBG in triage 476

## 2016-03-30 ENCOUNTER — Encounter (HOSPITAL_COMMUNITY): Payer: Self-pay | Admitting: Cardiology

## 2016-03-30 ENCOUNTER — Emergency Department (HOSPITAL_COMMUNITY)
Admission: EM | Admit: 2016-03-30 | Discharge: 2016-03-30 | Disposition: A | Discharge status: Home / Self Care | Payer: Medicaid Other | Attending: Emergency Medicine | Admitting: Emergency Medicine

## 2016-03-30 DIAGNOSIS — Z794 Long term (current) use of insulin: Secondary | ICD-10-CM | POA: Insufficient documentation

## 2016-03-30 DIAGNOSIS — J449 Chronic obstructive pulmonary disease, unspecified: Secondary | ICD-10-CM | POA: Diagnosis not present

## 2016-03-30 DIAGNOSIS — L02419 Cutaneous abscess of limb, unspecified: Secondary | ICD-10-CM

## 2016-03-30 DIAGNOSIS — Z7984 Long term (current) use of oral hypoglycemic drugs: Secondary | ICD-10-CM | POA: Diagnosis not present

## 2016-03-30 DIAGNOSIS — L02411 Cutaneous abscess of right axilla: Secondary | ICD-10-CM | POA: Diagnosis not present

## 2016-03-30 DIAGNOSIS — I10 Essential (primary) hypertension: Secondary | ICD-10-CM | POA: Diagnosis not present

## 2016-03-30 DIAGNOSIS — Z87891 Personal history of nicotine dependence: Secondary | ICD-10-CM | POA: Insufficient documentation

## 2016-03-30 DIAGNOSIS — Z79899 Other long term (current) drug therapy: Secondary | ICD-10-CM | POA: Diagnosis not present

## 2016-03-30 DIAGNOSIS — Z7982 Long term (current) use of aspirin: Secondary | ICD-10-CM | POA: Insufficient documentation

## 2016-03-30 DIAGNOSIS — E119 Type 2 diabetes mellitus without complications: Secondary | ICD-10-CM | POA: Diagnosis not present

## 2016-03-30 MED ORDER — HYDROCODONE-ACETAMINOPHEN 5-325 MG PO TABS
2.0000 | ORAL_TABLET | Freq: Once | ORAL | Status: AC
  Administered 2016-03-30: 2 via ORAL
  Filled 2016-03-30: qty 2

## 2016-03-30 MED ORDER — DOXYCYCLINE HYCLATE 100 MG PO CAPS: 100.0000 mg | ORAL_CAPSULE | Freq: Two times a day (BID) | ORAL | 0 refills | Status: DC

## 2016-03-30 MED ORDER — HYDROCODONE-ACETAMINOPHEN 5-325 MG PO TABS: 1.0000 | ORAL_TABLET | ORAL | 0 refills | Status: DC | PRN

## 2016-03-30 MED ORDER — LIDOCAINE HCL (PF) 1 % IJ SOLN
INTRAMUSCULAR | Status: AC
  Administered 2016-03-30: 5 mL
  Filled 2016-03-30: qty 5

## 2016-03-30 MED ORDER — PROMETHAZINE HCL 12.5 MG PO TABS
12.5000 mg | ORAL_TABLET | Freq: Once | ORAL | Status: AC
  Administered 2016-03-30: 12.5 mg via ORAL
  Filled 2016-03-30: qty 1

## 2016-03-30 MED ORDER — CEFTRIAXONE SODIUM 1 G IJ SOLR
1.0000 g | Freq: Once | INTRAMUSCULAR | Status: AC
  Administered 2016-03-30: 1 g via INTRAMUSCULAR
  Filled 2016-03-30: qty 10

## 2016-03-30 MED ORDER — DOXYCYCLINE HYCLATE 100 MG PO TABS
100.0000 mg | ORAL_TABLET | Freq: Once | ORAL | Status: AC
  Administered 2016-03-30: 100 mg via ORAL
  Filled 2016-03-30: qty 1

## 2016-03-30 NOTE — Discharge Instructions (Signed)
The abscess area under your right arm has multiple heads and smaller abscess areas. Please use warm Epsom salt soaks daily for about 15-20 minutes. Please see Dr. Arnoldo Morale for surgical management of this issue. Please use doxycycline 2 times daily with food. Use Tylenol or ibuprofen for mild discomfort. May use Norco for more severe discomfort.This medication may cause drowsiness. Please do not drink, drive, or participate in activity that requires concentration while taking this medication.

## 2016-03-30 NOTE — ED Notes (Signed)
Pt verbalized understanding of no driving and to use caution within 4 hours of taking pain meds due to meds cause drowsiness.  Instructed pt to take all of antibiotics as prescribed. 

## 2016-03-30 NOTE — ED Triage Notes (Signed)
Abscess right axilla times 2 weeks.

## 2016-03-30 NOTE — ED Notes (Signed)
Pt in room getting on clothes.  States she is leaving.  Spoke with Lily Kocher PA and states she is next to be seen.  Pt and family updated.

## 2016-03-30 NOTE — ED Provider Notes (Signed)
[facility] DEPT Provider Note   CSN: [id number] Arrival date & time: 03/30/16  0900     History   Chief Complaint No chief complaint on file.   HPI Martha [name] is a 50 y.o. female.  Patient is a 50 year old female who presents to the emergency department with a complaint of abscess of the right under arm area.  The patient states that she has had an abscess for at least 2 weeks, maybe longer on. She knowledge is that she is a diabetic. She has been trying home remedies, but these have not been successful. She denies any high fever. She does state that she has pain with certain movement involving the abscess area. She has had this problem in the past, and she is concerned that this issue continues to recur. No abscess areas reported in any other portions of the body.   The history is provided by the patient.    Past Medical History:  Diagnosis Date  . Acid reflux   . Anemia   . Arthritis   . Bipolar 1 disorder ([place])   . Cervical radiculopathy   . COPD (chronic obstructive pulmonary disease) ([place]) 2014   bronchitis  . Degenerative disc disease, thoracic   . Depression   . Diabetes mellitus   . Headache   . Hypertension   . Noncompliance with medication regimen   . Obesity (BMI 30-39.9)   . OSA (obstructive sleep apnea)    cpap  . RLS (restless legs syndrome)   . Shortness of breath     Patient Active Problem List   Diagnosis Date Noted  . Abdominal pain 04/26/2014  . Cholelithiasis 04/26/2014  . Acute renal failure ([place]) 04/26/2014  . Dehydration 04/26/2014  . Hyponatremia 04/26/2014  . Gallstones 04/06/2014  . Uncontrolled diabetes mellitus ([place]) 10/09/2013  . Breast abscess 05/30/2013  . Diabetes mellitus, type 2 ([place]) 05/30/2013  . Hyperglycemia 05/30/2013  . Internal hemorrhoids with other complication [id number]  . Umbilical hernia without mention of obstruction or gangrene 04/15/2013  . Anemia 04/15/2013  . DM 05/13/2010  . HYPERLIPIDEMIA  05/13/2010  . OBESITY 05/13/2010  . DEPRESSION 05/13/2010  . RESTLESS LEG SYNDROME 05/13/2010  . HYPERTENSION 05/13/2010  . GERD 05/13/2010  . RECTAL BLEEDING 05/13/2010  . SLEEP APNEA 05/13/2010  . Dysphagia, oropharyngeal phase 05/13/2010    Past Surgical History:  Procedure Laterality Date  . BIOPSY N/A 05/17/2013   Procedure: BIOPSY;  Surgeon: [name], MD;  Location: [facility];  Service: Endoscopy;  Laterality: N/A;  . CHOLECYSTECTOMY N/A 04/27/2014   Procedure: LAPAROSCOPIC CHOLECYSTECTOMY;  Surgeon: [name], MD;  Location: [facility];  Service: General;  Laterality: N/A;  . COLONOSCOPY  06/06/2010   [id number] bleeding secondary to internal hemorrhoids but incomplete evaluation secondary to poor right colon prep/small rectal and sigmoid colon polyps (hyperplastic). PROPOFOL  . COLONOSCOPY  May 2013   Dr. Gilliam/[facility]: 5 mm a descending colon polyp, hyperplastic. Adequate bowel prep.  . ENDOMETRIAL ABLATION    . ESOPHAGOGASTRODUODENOSCOPY  06/06/2010   [id number] erythema and edema of body of stomach, with sessile polypoid lesions. bx benign. no h.pylori  . ESOPHAGOGASTRODUODENOSCOPY (EGD) WITH PROPOFOL N/A 05/17/2013   Dr. Oneida Alar: normal esophagus, moderate nodular gastritis, negative path, empiric Savary dilation  . FLEXIBLE SIGMOIDOSCOPY N/A 05/17/2013   Dr. Oneida Alar: moderate sized internal hemorrhoids  . HEMORRHOID BANDING N/A 05/17/2013   Procedure: HEMORRHOID BANDING;  Surgeon: [name], MD;  Location: [facility];  Service: Endoscopy;  Laterality: N/A;  2 bands placed  . INCISION AND DRAINAGE ABSCESS Left 10/11/2013   Procedure: INCISION AND DRAINAGE AND DEBRIDEMENT LEFT BREAST  ABSCESS;  Surgeon: [name], MD;  Location: [facility];  Service: General;  Laterality: Left;  . IRRIGATION AND DEBRIDEMENT ABSCESS Right 06/01/2013   Procedure: INCISION AND DRAINAGE AND DEBRIDEMENT ABSCESS RIGHT BREAST;  Surgeon: [name], MD;  Location: [facility];   Service: General;  Laterality: Right;  . SAVORY DILATION N/A 05/17/2013   Procedure: SAVORY DILATION;  Surgeon: [name], MD;  Location: [facility];  Service: Endoscopy;  Laterality: N/A;  14/15/16  . TUBAL LIGATION      OB History    Gravida Para Term Preterm AB Living   2 2 1 1   2    SAB TAB Ectopic Multiple Live Births                   Home Medications    Prior to Admission medications   Medication Sig Start Date End Date Taking? Authorizing Provider  albuterol (PROVENTIL) (2.5 MG/3ML) 0.083% nebulizer solution Take 2.5 mg by nebulization 4 (four) times daily.     Historical Provider, MD  ALPRAZolam Duanne Moron) 1 MG tablet Take 1 mg by mouth 4 (four) times daily.      Historical Provider, MD  amLODipine (NORVASC) 10 MG tablet Take 10 mg by mouth daily.    Historical Provider, MD  aspirin-sod bicarb-citric acid (ALKA-SELTZER) 325 MG TBEF tablet Take 325 mg by mouth every 6 (six) hours as needed (cold).    Historical Provider, MD  cephALEXin (KEFLEX) 500 MG capsule Take 1 capsule (500 mg total) by mouth 4 (four) times daily. 01/16/16   Archie Patten, MD  diclofenac (VOLTAREN) 75 MG EC tablet Take 1 tablet (75 mg total) by mouth 2 (two) times daily with a meal. 10/17/14   Carole Civil, MD  dicyclomine (BENTYL) 10 MG capsule Take 1 capsule (10 mg total) by mouth 4 (four) times daily -  before meals and at bedtime. 02/09/14   Annitta Needs, NP  doxycycline (VIBRAMYCIN) 100 MG capsule Take 1 capsule (100 mg total) by mouth 2 (two) times daily. 01/16/16   Archie Patten, MD  DULoxetine (CYMBALTA) 60 MG capsule Take 60 mg by mouth daily.    Historical Provider, MD  esomeprazole (NEXIUM) 40 MG capsule Take 40 mg by mouth every morning.    Historical Provider, MD  gabapentin (NEURONTIN) 300 MG capsule Take 300 mg by mouth 3 (three) times daily.    Historical Provider, MD  HYDROcodone-acetaminophen (NORCO/VICODIN) 5-325 MG tablet Take 1-2 tablets by mouth every 6 (six) hours as needed.  11/20/15   Fredia Sorrow, MD  insulin aspart protamine- aspart (NOVOLOG MIX 70/30) (70-30) 100 UNIT/ML injection Inject 1-25 Units into the skin 2 (two) times daily. Per sliding scale-patient sliding scale    Historical Provider, MD  insulin glargine (LANTUS) 100 UNIT/ML injection Inject 80 Units into the skin 2 (two) times daily.     Historical Provider, MD  lisinopril-hydrochlorothiazide (PRINZIDE,ZESTORETIC) 20-12.5 MG per tablet Take 1 tablet by mouth 2 (two) times daily.     Historical Provider, MD  meloxicam (MOBIC) 15 MG tablet Take 15 mg by mouth daily.    Historical Provider, MD  metFORMIN (GLUCOPHAGE) 1000 MG tablet Take 1,000 mg by mouth 2 (two) times daily with a meal.    Historical Provider, MD  Potassium 99 MG TABS Take 1 tablet by mouth daily.  Historical Provider, MD  rOPINIRole (REQUIP) 1 MG tablet Take 1 mg by mouth at bedtime.     Historical Provider, MD  simvastatin (ZOCOR) 40 MG tablet Take 40 mg by mouth every evening.    Historical Provider, MD    Family History Family History  Problem Relation Age of Onset  . Adopted: Yes  . Family history unknown: Yes    Social History Social History  Substance Use Topics  . Smoking status: Former Smoker    Packs/day: 1.50    Years: 25.00    Types: Cigarettes    Quit date: 10/23/2007  . Smokeless tobacco: Former Systems developer     Comment: Quit x 6 years ago  . Alcohol use No     Allergies   Codeine   Review of Systems Review of Systems  Musculoskeletal: Positive for back pain.  Skin:       Abscess  Neurological: Positive for headaches.  All other systems reviewed and are negative.    Physical Exam Updated Vital Signs BP (!) 194/108   Pulse 90   Temp 97.6 F (36.4 C) (Oral)   Resp 20   Ht 5\' 8"  (1.727 m)   Wt 136.1 kg   SpO2 96%   BMI 45.61 kg/m   Physical Exam  Constitutional: She is oriented to person, place, and time. She appears well-developed and well-nourished.  Non-toxic appearance.  HENT:  Head:  Normocephalic.  Right Ear: Tympanic membrane and external ear normal.  Left Ear: Tympanic membrane and external ear normal.  Eyes: EOM and lids are normal. Pupils are equal, round, and reactive to light.  Neck: Normal range of motion. Neck supple. Carotid bruit is not present.  Cardiovascular: Normal rate, regular rhythm, normal heart sounds, intact distal pulses and normal pulses.   Pulmonary/Chest: Breath sounds normal. No respiratory distress.  Abdominal: Soft. Bowel sounds are normal. There is no tenderness. There is no guarding.  Musculoskeletal: Normal range of motion.  There is an abscess area with 3 different heads at the right axilla. There no red streaks appreciated. The axilla is warm, but not hot. One of the areas has minimal drainage present.  There is full range of motion of the right shoulder, elbow, wrist, and fingers. Radial pulses 2+.  Lymphadenopathy:       Head (right side): No submandibular adenopathy present.       Head (left side): No submandibular adenopathy present.    She has no cervical adenopathy.  Neurological: She is alert and oriented to person, place, and time. She has normal strength. No cranial nerve deficit or sensory deficit.  Skin: Skin is warm and dry.  Psychiatric: She has a normal mood and affect. Her speech is normal.  Nursing note and vitals reviewed.    ED Treatments / Results  Labs (all labs ordered are listed, but only abnormal results are displayed) Labs Reviewed - No data to display  EKG  EKG Interpretation None       Radiology No results found.  Procedures Procedures (including critical care time)  Medications Ordered in ED Medications - No data to display   Initial Impression / Assessment and Plan / ED Course  I have reviewed the triage vital signs and the nursing notes.  Pertinent labs & imaging results that were available during my care of the patient were reviewed by me and considered in my medical decision making  (see chart for details).  Clinical Course    *I have reviewed nursing notes, vital signs,  and all appropriate lab and imaging results for this patient.**  Final Clinical Impressions(s) / ED Diagnoses  Vital signs within normal limits with exception of the blood pressure being elevated at 194/108. Patient has a history of hypertension.  The patient has an abscess with 3 different heads onto it. I discussed with the patient that one of them was easily assessable and we could do an incision and drainage, but she would need to see a surgeon for completion of the procedure. The patient states that she would rather see the surgeon and have everything done at one time. Incision and drainage again offered, but the patient states she would rather see a surgeon and have everything done at one time. Patient will be placed on doxycycline and Norco on. She will be referred to Dr. Arnoldo Morale for general surgery evaluation.    Final diagnoses:  None    New Prescriptions New Prescriptions   No medications on file     Lily Kocher, PA-C 03/30/16 1054    Milton Ferguson, MD 04/02/16 1022

## 2016-04-02 ENCOUNTER — Emergency Department (HOSPITAL_COMMUNITY)
Admission: EM | Admit: 2016-04-02 | Discharge: 2016-04-03 | Disposition: A | Discharge status: Home / Self Care | Payer: Medicaid Other | Attending: Emergency Medicine | Admitting: Emergency Medicine

## 2016-04-02 ENCOUNTER — Encounter (HOSPITAL_COMMUNITY): Payer: Self-pay | Admitting: *Deleted

## 2016-04-02 ENCOUNTER — Emergency Department (HOSPITAL_COMMUNITY): Payer: Medicaid Other

## 2016-04-02 DIAGNOSIS — Z87891 Personal history of nicotine dependence: Secondary | ICD-10-CM | POA: Insufficient documentation

## 2016-04-02 DIAGNOSIS — E1122 Type 2 diabetes mellitus with diabetic chronic kidney disease: Secondary | ICD-10-CM | POA: Diagnosis not present

## 2016-04-02 DIAGNOSIS — K529 Noninfective gastroenteritis and colitis, unspecified: Secondary | ICD-10-CM | POA: Diagnosis not present

## 2016-04-02 DIAGNOSIS — I129 Hypertensive chronic kidney disease with stage 1 through stage 4 chronic kidney disease, or unspecified chronic kidney disease: Secondary | ICD-10-CM | POA: Insufficient documentation

## 2016-04-02 DIAGNOSIS — J449 Chronic obstructive pulmonary disease, unspecified: Secondary | ICD-10-CM | POA: Insufficient documentation

## 2016-04-02 DIAGNOSIS — Z7982 Long term (current) use of aspirin: Secondary | ICD-10-CM | POA: Diagnosis not present

## 2016-04-02 DIAGNOSIS — K5900 Constipation, unspecified: Secondary | ICD-10-CM | POA: Diagnosis present

## 2016-04-02 DIAGNOSIS — Z7984 Long term (current) use of oral hypoglycemic drugs: Secondary | ICD-10-CM | POA: Insufficient documentation

## 2016-04-02 DIAGNOSIS — Z794 Long term (current) use of insulin: Secondary | ICD-10-CM | POA: Insufficient documentation

## 2016-04-02 DIAGNOSIS — N189 Chronic kidney disease, unspecified: Secondary | ICD-10-CM | POA: Diagnosis not present

## 2016-04-02 DIAGNOSIS — I10 Essential (primary) hypertension: Secondary | ICD-10-CM

## 2016-04-02 LAB — CBC WITH DIFFERENTIAL/PLATELET
BASOS ABS: 0 10*3/uL (ref 0.0–0.1)
BASOS PCT: 1 %
EOS PCT: 3 %
Eosinophils Absolute: 0.2 10*3/uL (ref 0.0–0.7)
HCT: 37.7 % (ref 36.0–46.0)
Hemoglobin: 12.6 g/dL (ref 12.0–15.0)
LYMPHS PCT: 45 %
Lymphs Abs: 3 10*3/uL (ref 0.7–4.0)
MCH: 26.9 pg (ref 26.0–34.0)
MCHC: 33.4 g/dL (ref 30.0–36.0)
MCV: 80.6 fL (ref 78.0–100.0)
MONO ABS: 0.3 10*3/uL (ref 0.1–1.0)
Monocytes Relative: 5 %
Neutro Abs: 3 10*3/uL (ref 1.7–7.7)
Neutrophils Relative %: 46 %
PLATELETS: 221 10*3/uL (ref 150–400)
RBC: 4.68 MIL/uL (ref 3.87–5.11)
RDW: 13.9 % (ref 11.5–15.5)
WBC: 6.6 10*3/uL (ref 4.0–10.5)

## 2016-04-02 LAB — URINALYSIS, ROUTINE W REFLEX MICROSCOPIC
BILIRUBIN URINE: NEGATIVE
KETONES UR: NEGATIVE mg/dL
Leukocytes, UA: NEGATIVE
Nitrite: NEGATIVE
PH: 7 (ref 5.0–8.0)
Protein, ur: >300 mg/dL — AB
SPECIFIC GRAVITY, URINE: 1.015 (ref 1.005–1.030)

## 2016-04-02 LAB — COMPREHENSIVE METABOLIC PANEL
ALK PHOS: 90 U/L (ref 38–126)
ALT: 11 U/L — ABNORMAL LOW (ref 14–54)
ANION GAP: 10 (ref 5–15)
AST: 12 U/L — AB (ref 15–41)
Albumin: 2.7 g/dL — ABNORMAL LOW (ref 3.5–5.0)
BILIRUBIN TOTAL: 0.4 mg/dL (ref 0.3–1.2)
BUN: 19 mg/dL (ref 6–20)
CALCIUM: 9.3 mg/dL (ref 8.9–10.3)
CO2: 26 mmol/L (ref 22–32)
Chloride: 102 mmol/L (ref 101–111)
Creatinine, Ser: 3.27 mg/dL — ABNORMAL HIGH (ref 0.44–1.00)
GFR calc Af Amer: 18 mL/min — ABNORMAL LOW (ref >60)
GFR, EST NON AFRICAN AMERICAN: 16 mL/min — AB (ref >60)
GLUCOSE: 358 mg/dL — AB (ref 65–99)
POTASSIUM: 3.4 mmol/L — AB (ref 3.5–5.1)
Sodium: 138 mmol/L (ref 135–145)
TOTAL PROTEIN: 6.3 g/dL — AB (ref 6.5–8.1)

## 2016-04-02 LAB — URINE MICROSCOPIC-ADD ON

## 2016-04-02 LAB — LIPASE, BLOOD: LIPASE: 27 U/L (ref 11–51)

## 2016-04-02 MED ORDER — ONDANSETRON HCL 8 MG PO TABS: 8.0000 mg | ORAL_TABLET | ORAL | 0 refills | Status: DC | PRN

## 2016-04-02 MED ORDER — SODIUM CHLORIDE 0.9 % IV BOLUS (SEPSIS)
1000.0000 mL | Freq: Once | INTRAVENOUS | Status: AC
  Administered 2016-04-02: 1000 mL via INTRAVENOUS

## 2016-04-02 MED ORDER — ONDANSETRON HCL 4 MG/2ML IJ SOLN
4.0000 mg | Freq: Once | INTRAMUSCULAR | Status: AC
  Administered 2016-04-02: 4 mg via INTRAVENOUS
  Filled 2016-04-02: qty 2

## 2016-04-02 NOTE — ED Provider Notes (Signed)
[facility] DEPT Provider Note   CSN: [id number] Arrival date & time: 04/02/16  1819  By signing my name below, I, Ephriam Jenkins, attest that this documentation has been prepared under the direction and in the presence of No att. providers found. Electronically signed, Ephriam Jenkins, ED Scribe. 04/02/16. 9:55 PM.  History   Chief Complaint Chief Complaint  Patient presents with  . Constipation  . Emesis    HPI HPI Comments: Martha [name] is a 50 y.o. Female with a PMHx of DM, HTN, who presents to the Emergency Department complaining of constant lower abdominal pain with associated constipation, onset five days ago. Pt states that her last bowel movement was five days ago. She also reports associated one episode of vomiting yesterday. Pt states that she takes 3 or 4 oxycodone's a day for pain in her ankle. She reports that she has had similar constipation symptoms in the past due to the oxycodone. Pt was seen here three days ago for multiple small abscesses to her right axillary region and is currently on a cycle of Doxycycline. PCP is Dr. Luan Pulling.    The history is provided by the patient. No language interpreter was used.    Past Medical History:  Diagnosis Date  . Acid reflux   . Anemia   . Arthritis   . Bipolar 1 disorder ([place])   . Cervical radiculopathy   . COPD (chronic obstructive pulmonary disease) ([place]) 2014   bronchitis  . Degenerative disc disease, thoracic   . Depression   . Diabetes mellitus   . Headache   . Hypertension   . Noncompliance with medication regimen   . Obesity (BMI 30-39.9)   . OSA (obstructive sleep apnea)    cpap  . RLS (restless legs syndrome)   . Shortness of breath     Patient Active Problem List   Diagnosis Date Noted  . Abdominal pain 04/26/2014  . Cholelithiasis 04/26/2014  . Acute renal failure ([place]) 04/26/2014  . Dehydration 04/26/2014  . Hyponatremia 04/26/2014  . Gallstones 04/06/2014  . Uncontrolled diabetes mellitus ([place])  10/09/2013  . Breast abscess 05/30/2013  . Diabetes mellitus, type 2 ([place]) 05/30/2013  . Hyperglycemia 05/30/2013  . Internal hemorrhoids with other complication [id number]  . Umbilical hernia without mention of obstruction or gangrene 04/15/2013  . Anemia 04/15/2013  . DM 05/13/2010  . HYPERLIPIDEMIA 05/13/2010  . OBESITY 05/13/2010  . DEPRESSION 05/13/2010  . RESTLESS LEG SYNDROME 05/13/2010  . HYPERTENSION 05/13/2010  . GERD 05/13/2010  . RECTAL BLEEDING 05/13/2010  . SLEEP APNEA 05/13/2010  . Dysphagia, oropharyngeal phase 05/13/2010    Past Surgical History:  Procedure Laterality Date  . BIOPSY N/A 05/17/2013   Procedure: BIOPSY;  Surgeon: [name], MD;  Location: [facility];  Service: Endoscopy;  Laterality: N/A;  . CHOLECYSTECTOMY N/A 04/27/2014   Procedure: LAPAROSCOPIC CHOLECYSTECTOMY;  Surgeon: [name], MD;  Location: [facility];  Service: General;  Laterality: N/A;  . COLONOSCOPY  06/06/2010   [id number] bleeding secondary to internal hemorrhoids but incomplete evaluation secondary to poor right colon prep/small rectal and sigmoid colon polyps (hyperplastic). PROPOFOL  . COLONOSCOPY  May 2013   Dr. Gilliam/[facility]: 5 mm a descending colon polyp, hyperplastic. Adequate bowel prep.  . ENDOMETRIAL ABLATION    . ESOPHAGOGASTRODUODENOSCOPY  06/06/2010   [id number] erythema and edema of body of stomach, with sessile polypoid lesions. bx benign. no h.pylori  . ESOPHAGOGASTRODUODENOSCOPY (EGD) WITH PROPOFOL N/A 05/17/2013   Dr. Oneida Alar: normal esophagus, moderate nodular gastritis,  negative path, empiric Savary dilation  . FLEXIBLE SIGMOIDOSCOPY N/A 05/17/2013   Dr. Oneida Alar: moderate sized internal hemorrhoids  . HEMORRHOID BANDING N/A 05/17/2013   Procedure: HEMORRHOID BANDING;  Surgeon: [name], MD;  Location: [facility];  Service: Endoscopy;  Laterality: N/A;  2 bands placed  . INCISION AND DRAINAGE ABSCESS Left 10/11/2013   Procedure: INCISION AND DRAINAGE AND  DEBRIDEMENT LEFT BREAST  ABSCESS;  Surgeon: [name], MD;  Location: [facility];  Service: General;  Laterality: Left;  . IRRIGATION AND DEBRIDEMENT ABSCESS Right 06/01/2013   Procedure: INCISION AND DRAINAGE AND DEBRIDEMENT ABSCESS RIGHT BREAST;  Surgeon: [name], MD;  Location: [facility];  Service: General;  Laterality: Right;  . SAVORY DILATION N/A 05/17/2013   Procedure: SAVORY DILATION;  Surgeon: [name], MD;  Location: [facility];  Service: Endoscopy;  Laterality: N/A;  14/15/16  . TUBAL LIGATION      OB History    Gravida Para Term Preterm AB Living   2 2 1 1   2    SAB TAB Ectopic Multiple Live Births                   Home Medications    Prior to Admission medications   Medication Sig Start Date End Date Taking? Authorizing Provider  albuterol (PROVENTIL) (2.5 MG/3ML) 0.083% nebulizer solution Take 2.5 mg by nebulization 4 (four) times daily.     Historical Provider, MD  ALPRAZolam Duanne Moron) 1 MG tablet Take 1 mg by mouth 4 (four) times daily.      Historical Provider, MD  amLODipine (NORVASC) 10 MG tablet Take 10 mg by mouth daily.    Historical Provider, MD  aspirin-sod bicarb-citric acid (ALKA-SELTZER) 325 MG TBEF tablet Take 325 mg by mouth every 6 (six) hours as needed (cold).    Historical Provider, MD  cephALEXin (KEFLEX) 500 MG capsule Take 1 capsule (500 mg total) by mouth 4 (four) times daily. 01/16/16   Archie Patten, MD  diclofenac (VOLTAREN) 75 MG EC tablet Take 1 tablet (75 mg total) by mouth 2 (two) times daily with a meal. 10/17/14   Carole Civil, MD  dicyclomine (BENTYL) 10 MG capsule Take 1 capsule (10 mg total) by mouth 4 (four) times daily -  before meals and at bedtime. 02/09/14   Annitta Needs, NP  doxycycline (VIBRAMYCIN) 100 MG capsule Take 1 capsule (100 mg total) by mouth 2 (two) times daily. 03/30/16   Lily Kocher, PA-C  DULoxetine (CYMBALTA) 60 MG capsule Take 60 mg by mouth daily.    Historical Provider, MD  esomeprazole (NEXIUM)  40 MG capsule Take 40 mg by mouth every morning.    Historical Provider, MD  gabapentin (NEURONTIN) 300 MG capsule Take 300 mg by mouth 3 (three) times daily.    Historical Provider, MD  HYDROcodone-acetaminophen (NORCO/VICODIN) 5-325 MG tablet Take 1 tablet by mouth every 4 (four) hours as needed. Take with food 03/30/16   Lily Kocher, PA-C  insulin aspart protamine- aspart (NOVOLOG MIX 70/30) (70-30) 100 UNIT/ML injection Inject 1-25 Units into the skin 2 (two) times daily. Per sliding scale-patient sliding scale    Historical Provider, MD  insulin glargine (LANTUS) 100 UNIT/ML injection Inject 80 Units into the skin 2 (two) times daily.     Historical Provider, MD  lisinopril-hydrochlorothiazide (PRINZIDE,ZESTORETIC) 20-12.5 MG per tablet Take 1 tablet by mouth 2 (two) times daily.     Historical Provider, MD  meloxicam (MOBIC) 15 MG tablet Take  15 mg by mouth daily.    Historical Provider, MD  metFORMIN (GLUCOPHAGE) 1000 MG tablet Take 1,000 mg by mouth 2 (two) times daily with a meal.    Historical Provider, MD  Potassium 99 MG TABS Take 1 tablet by mouth daily.    Historical Provider, MD  rOPINIRole (REQUIP) 1 MG tablet Take 1 mg by mouth at bedtime.     Historical Provider, MD  simvastatin (ZOCOR) 40 MG tablet Take 40 mg by mouth every evening.    Historical Provider, MD    Family History Family History  Problem Relation Age of Onset  . Adopted: Yes  . Family history unknown: Yes    Social History Social History  Substance Use Topics  . Smoking status: Former Smoker    Packs/day: 1.50    Years: 25.00    Types: Cigarettes    Quit date: 10/23/2007  . Smokeless tobacco: Former Systems developer     Comment: Quit x 6 years ago  . Alcohol use No     Allergies   Codeine   Review of Systems Review of Systems  Constitutional: Negative for fever.  Gastrointestinal: Positive for abdominal pain (lower), constipation and vomiting.  Skin: Positive for wound (healing abscess wounds to right  axillary region).  All other systems reviewed and are negative.    Physical Exam Updated Vital Signs BP (!) 252/136 (BP Location: Right Arm)   Pulse 85   Temp 98 F (36.7 C) (Oral)   Resp 20   Wt 295 lb 6 oz (134 kg)   SpO2 99%   BMI 44.91 kg/m   Physical Exam  Constitutional: She is oriented to person, place, and time. She appears well-developed and well-nourished.  Obese  HENT:  Head: Normocephalic and atraumatic.  Eyes: Conjunctivae are normal.  Neck: Neck supple.  Cardiovascular: Normal rate and regular rhythm.   Pulmonary/Chest: Effort normal and breath sounds normal.  Abdominal: Soft. Bowel sounds are normal. There is tenderness.  Diffuse minimal lower abdominal tenderness  Musculoskeletal: Normal range of motion.  Neurological: She is alert and oriented to person, place, and time.  Skin: Skin is warm and dry.  Right axilla healing boils with purulent drainage.   Psychiatric: She has a normal mood and affect. Her behavior is normal.  Nursing note and vitals reviewed.    ED Treatments / Results  DIAGNOSTIC STUDIES: Oxygen Saturation is 99% on RA, normal by my interpretation.  COORDINATION OF CARE: 9:52 PM-Will order IV fluids. Discussed treatment plan with pt at bedside and pt agreed to plan.   Labs (all labs ordered are listed, but only abnormal results are displayed) Labs Reviewed - No data to display  EKG  EKG Interpretation None       Radiology No results found.  Procedures Procedures (including critical care time)  Medications Ordered in ED Medications - No data to display   Initial Impression / Assessment and Plan / ED Course  I have reviewed the triage vital signs and the nursing notes.  Pertinent labs & imaging results that were available during my care of the patient were reviewed by me and considered in my medical decision making (see chart for details).  Clinical Course    Patient's primary clinical presentation is related to  vomiting and dehydration. She feels much better after IV fluids. Her blood pressure is elevated but she is asymptomatic. She did not take her medication today secondary to her upset stomach. Additionally her creatinine remains elevated. These findings were discussed with  the patient and her husband in great detail. She will follow-up with nephrologist. Discharge medications Zofran 8 mg. She will start her antihypertensives when she gets home.  Final Clinical Impressions(s) / ED Diagnoses   Final diagnoses:  None    New Prescriptions New Prescriptions   No medications on file  I personally performed the services described in this documentation, which was scribed in my presence. The recorded information has been reviewed and is accurate.      Nat Christen, MD 04/02/16 [phone]

## 2016-04-02 NOTE — ED Triage Notes (Signed)
Pt was seen here 2 days ago for underarm abscesses and was given antibiotics. Pt started the antibiotics and began vomiting. Pt has had 2 episodes of vomiting since being home. States she hasnt had a bowel movement in 3 days. Pt BP elevated in triage.

## 2016-04-02 NOTE — Discharge Instructions (Signed)
You have abnormal kidney function. You must see a nephrologist or kidney specialist. Phone number given. Prescription for nausea medicine. Please start your blood pressure medicine in the morning. Return if worse.

## 2016-04-02 NOTE — ED Notes (Signed)
Pt very upset over the wait to be seen, reporting abd pain and cramping.  Pt blood pressure 203/121, states she has been taking her bp meds but has also been vomiting and not sure if the meds have stayed down

## 2016-04-03 NOTE — ED Notes (Signed)
MD aware of patients blood pressure.

## 2016-04-14 ENCOUNTER — Encounter (HOSPITAL_COMMUNITY): Payer: Self-pay | Admitting: Emergency Medicine

## 2016-04-14 ENCOUNTER — Emergency Department (HOSPITAL_COMMUNITY)
Admission: EM | Admit: 2016-04-14 | Discharge: 2016-04-14 | Disposition: A | Discharge status: Home / Self Care | Payer: Medicaid Other | Attending: Emergency Medicine | Admitting: Emergency Medicine

## 2016-04-14 DIAGNOSIS — I10 Essential (primary) hypertension: Secondary | ICD-10-CM | POA: Diagnosis not present

## 2016-04-14 DIAGNOSIS — E119 Type 2 diabetes mellitus without complications: Secondary | ICD-10-CM | POA: Diagnosis not present

## 2016-04-14 DIAGNOSIS — Z7984 Long term (current) use of oral hypoglycemic drugs: Secondary | ICD-10-CM | POA: Insufficient documentation

## 2016-04-14 DIAGNOSIS — M5441 Lumbago with sciatica, right side: Secondary | ICD-10-CM | POA: Insufficient documentation

## 2016-04-14 DIAGNOSIS — M5431 Sciatica, right side: Secondary | ICD-10-CM

## 2016-04-14 DIAGNOSIS — Z7982 Long term (current) use of aspirin: Secondary | ICD-10-CM | POA: Diagnosis not present

## 2016-04-14 DIAGNOSIS — Z87891 Personal history of nicotine dependence: Secondary | ICD-10-CM | POA: Insufficient documentation

## 2016-04-14 DIAGNOSIS — Z79899 Other long term (current) drug therapy: Secondary | ICD-10-CM | POA: Insufficient documentation

## 2016-04-14 DIAGNOSIS — J449 Chronic obstructive pulmonary disease, unspecified: Secondary | ICD-10-CM | POA: Diagnosis not present

## 2016-04-14 DIAGNOSIS — M545 Low back pain: Secondary | ICD-10-CM | POA: Diagnosis present

## 2016-04-14 DIAGNOSIS — Z794 Long term (current) use of insulin: Secondary | ICD-10-CM | POA: Diagnosis not present

## 2016-04-14 MED ORDER — ONDANSETRON 8 MG PO TBDP
8.0000 mg | ORAL_TABLET | Freq: Once | ORAL | Status: AC
  Administered 2016-04-14: 8 mg via ORAL
  Filled 2016-04-14: qty 1

## 2016-04-14 MED ORDER — DIAZEPAM 5 MG/ML IJ SOLN
2.5000 mg | Freq: Once | INTRAMUSCULAR | Status: AC
  Administered 2016-04-14: 2.5 mg via INTRAMUSCULAR
  Filled 2016-04-14: qty 2

## 2016-04-14 MED ORDER — DIAZEPAM 5 MG PO TABS: 5.0000 mg | ORAL_TABLET | Freq: Three times a day (TID) | ORAL | 0 refills | Status: DC | PRN

## 2016-04-14 MED ORDER — HYDROMORPHONE HCL 1 MG/ML IJ SOLN
1.0000 mg | Freq: Once | INTRAMUSCULAR | Status: AC
  Administered 2016-04-14: 1 mg via INTRAMUSCULAR
  Filled 2016-04-14: qty 1

## 2016-04-14 NOTE — ED Triage Notes (Addendum)
Pt c/o RT leg pain w/o injury or fall. Pt states, "it has been going on for a while, but I figured it would get better." Pt hypertensive 188/108.

## 2016-04-14 NOTE — Discharge Instructions (Signed)
Follow up with your primary doctor for further management if your sciatica and leg pain persist.  Continue taking the pain medication that he prescribes you.  I have added a muscle relaxer to help with your increased pain and muscle spasms.  I recommend using a heating pad applied to your right lower back 20 minutes 3 times daily.  Do not drive within 4 hours of taking valium as this will make you drowsy.  Avoid lifting,  Bending,  Twisting or any other activity that worsens your pain over the next week.   You should get rechecked if your symptoms are not better over the next 5 days,  Or you develop increased pain,  Weakness in your leg(s) or loss of bladder or bowel function - these are symptoms of a worse injury.

## 2016-04-14 NOTE — ED Provider Notes (Signed)
[facility] DEPT Provider Note   CSN: [id number] Arrival date & time: 04/14/16  1205  By signing my name below, I, Soijett Blue, attest that this documentation has been prepared under the direction and in the presence of Evalee Jefferson, PA-C Electronically Signed: Soijett Blue, ED Scribe. 04/14/16. 12:56 PM.    History   Chief Complaint Chief Complaint  Patient presents with  . Leg Pain    HPI  Martha [name] is a 50 y.o. female with a PMHx of DDD, DM, HTN, who presents to the Emergency Department complaining of right leg pain onset 1 month. Pt notes that her right leg pain starts at her right lower back and radiates to her right lower leg. Pt states that her right leg pain is worsened with movement, position change, and ambulation. Pt denies any alleviating factors for her symptoms. Pt reports that she does have a hx of back pain that she takes 10-325 mg oxycodone for  up to 4 times daily. Pt states that Dr. Luan Pulling is treating her back pain and that he has evaluated her right leg pain and contributed it to her hx of DM. Pt is having associated symptoms of gait problem due to pain.  She denies skin color change, wound, rash, weakness, fever, chills, bowel/bladder incontinence, and any other symptoms.    The history is provided by the patient. No language interpreter was used.    Past Medical History:  Diagnosis Date  . Acid reflux   . Anemia   . Arthritis   . Bipolar 1 disorder ([place])   . Cervical radiculopathy   . COPD (chronic obstructive pulmonary disease) ([place]) 2014   bronchitis  . Degenerative disc disease, thoracic   . Depression   . Diabetes mellitus   . Headache   . Hypertension   . Noncompliance with medication regimen   . Obesity (BMI 30-39.9)   . OSA (obstructive sleep apnea)    cpap  . RLS (restless legs syndrome)   . Shortness of breath     Patient Active Problem List   Diagnosis Date Noted  . Abdominal pain 04/26/2014  . Cholelithiasis 04/26/2014  .  Acute renal failure ([place]) 04/26/2014  . Dehydration 04/26/2014  . Hyponatremia 04/26/2014  . Gallstones 04/06/2014  . Uncontrolled diabetes mellitus ([place]) 10/09/2013  . Breast abscess 05/30/2013  . Diabetes mellitus, type 2 ([place]) 05/30/2013  . Hyperglycemia 05/30/2013  . Internal hemorrhoids with other complication 29/92/4268  . Umbilical hernia without mention of obstruction or gangrene 04/15/2013  . Anemia 04/15/2013  . DM 05/13/2010  . HYPERLIPIDEMIA 05/13/2010  . OBESITY 05/13/2010  . DEPRESSION 05/13/2010  . RESTLESS LEG SYNDROME 05/13/2010  . HYPERTENSION 05/13/2010  . GERD 05/13/2010  . RECTAL BLEEDING 05/13/2010  . SLEEP APNEA 05/13/2010  . Dysphagia, oropharyngeal phase 05/13/2010    Past Surgical History:  Procedure Laterality Date  . BIOPSY N/A 05/17/2013   Procedure: BIOPSY;  Surgeon: [name], MD;  Location: [facility];  Service: Endoscopy;  Laterality: N/A;  . CHOLECYSTECTOMY N/A 04/27/2014   Procedure: LAPAROSCOPIC CHOLECYSTECTOMY;  Surgeon: [name], MD;  Location: [facility];  Service: General;  Laterality: N/A;  . COLONOSCOPY  06/06/2010   TMH:DQQIWL bleeding secondary to internal hemorrhoids but incomplete evaluation secondary to poor right colon prep/small rectal and sigmoid colon polyps (hyperplastic). PROPOFOL  . COLONOSCOPY  May 2013   Dr. Gilliam/[facility]: 5 mm a descending colon polyp, hyperplastic. Adequate bowel prep.  . ENDOMETRIAL ABLATION    . ESOPHAGOGASTRODUODENOSCOPY  06/06/2010   ERD:EYCXKGY erythema and edema of body of stomach, with sessile polypoid lesions. bx benign. no h.pylori  . ESOPHAGOGASTRODUODENOSCOPY (EGD) WITH PROPOFOL N/A 05/17/2013   Dr. Oneida Alar: normal esophagus, moderate nodular gastritis, negative path, empiric Savary dilation  . FLEXIBLE SIGMOIDOSCOPY N/A 05/17/2013   Dr. Oneida Alar: moderate sized internal hemorrhoids  . HEMORRHOID BANDING N/A 05/17/2013   Procedure: HEMORRHOID BANDING;  Surgeon: [name], MD;   Location: [facility];  Service: Endoscopy;  Laterality: N/A;  2 bands placed  . INCISION AND DRAINAGE ABSCESS Left 10/11/2013   Procedure: INCISION AND DRAINAGE AND DEBRIDEMENT LEFT BREAST  ABSCESS;  Surgeon: [name], MD;  Location: [facility];  Service: General;  Laterality: Left;  . IRRIGATION AND DEBRIDEMENT ABSCESS Right 06/01/2013   Procedure: INCISION AND DRAINAGE AND DEBRIDEMENT ABSCESS RIGHT BREAST;  Surgeon: [name], MD;  Location: [facility];  Service: General;  Laterality: Right;  . SAVORY DILATION N/A 05/17/2013   Procedure: SAVORY DILATION;  Surgeon: [name], MD;  Location: [facility];  Service: Endoscopy;  Laterality: N/A;  14/15/16  . TUBAL LIGATION      OB History    Gravida Para Term Preterm AB Living   2 2 1 1   2    SAB TAB Ectopic Multiple Live Births                   Home Medications    Prior to Admission medications   Medication Sig Start Date End Date Taking? Authorizing Provider  albuterol (PROVENTIL) (2.5 MG/3ML) 0.083% nebulizer solution Take 2.5 mg by nebulization 4 (four) times daily.     Historical Provider, MD  ALPRAZolam Duanne Moron) 1 MG tablet Take 1 mg by mouth 4 (four) times daily.      Historical Provider, MD  amLODipine (NORVASC) 10 MG tablet Take 10 mg by mouth daily.    Historical Provider, MD  aspirin-sod bicarb-citric acid (ALKA-SELTZER) 325 MG TBEF tablet Take 325 mg by mouth every 6 (six) hours as needed (cold).    Historical Provider, MD  cephALEXin (KEFLEX) 500 MG capsule Take 1 capsule (500 mg total) by mouth 4 (four) times daily. 01/16/16   Archie Patten, MD  diazepam (VALIUM) 5 MG tablet Take 1 tablet (5 mg total) by mouth every 8 (eight) hours as needed for muscle spasms. 04/14/16   Evalee Jefferson, PA-C  diclofenac (VOLTAREN) 75 MG EC tablet Take 1 tablet (75 mg total) by mouth 2 (two) times daily with a meal. 10/17/14   Carole Civil, MD  dicyclomine (BENTYL) 10 MG capsule Take 1 capsule (10 mg total) by mouth 4 (four) times daily  -  before meals and at bedtime. 02/09/14   Annitta Needs, NP  doxycycline (VIBRAMYCIN) 100 MG capsule Take 1 capsule (100 mg total) by mouth 2 (two) times daily. 03/30/16   Lily Kocher, PA-C  DULoxetine (CYMBALTA) 60 MG capsule Take 60 mg by mouth daily.    Historical Provider, MD  esomeprazole (NEXIUM) 40 MG capsule Take 40 mg by mouth every morning.    Historical Provider, MD  gabapentin (NEURONTIN) 300 MG capsule Take 300 mg by mouth 3 (three) times daily.    Historical Provider, MD  HYDROcodone-acetaminophen (NORCO/VICODIN) 5-325 MG tablet Take 1 tablet by mouth every 4 (four) hours as needed. Take with food 03/30/16   Lily Kocher, PA-C  insulin aspart protamine- aspart (NOVOLOG MIX 70/30) (70-30) 100 UNIT/ML injection Inject 1-25 Units into the skin 2 (two) times daily.  Per sliding scale-patient sliding scale    Historical Provider, MD  insulin glargine (LANTUS) 100 UNIT/ML injection Inject 80 Units into the skin 2 (two) times daily.     Historical Provider, MD  lisinopril-hydrochlorothiazide (PRINZIDE,ZESTORETIC) 20-12.5 MG per tablet Take 1 tablet by mouth 2 (two) times daily.     Historical Provider, MD  meloxicam (MOBIC) 15 MG tablet Take 15 mg by mouth daily.    Historical Provider, MD  metFORMIN (GLUCOPHAGE) 1000 MG tablet Take 1,000 mg by mouth 2 (two) times daily with a meal.    Historical Provider, MD  ondansetron (ZOFRAN) 8 MG tablet Take 1 tablet (8 mg total) by mouth every 4 (four) hours as needed. 04/02/16   Nat Christen, MD  Potassium 99 MG TABS Take 1 tablet by mouth daily.    Historical Provider, MD  rOPINIRole (REQUIP) 1 MG tablet Take 1 mg by mouth at bedtime.     Historical Provider, MD  simvastatin (ZOCOR) 40 MG tablet Take 40 mg by mouth every evening.    Historical Provider, MD    Family History Family History  Problem Relation Age of Onset  . Adopted: Yes  . Family history unknown: Yes    Social History Social History  Substance Use Topics  . Smoking status: Former  Smoker    Packs/day: 1.50    Years: 25.00    Types: Cigarettes    Quit date: 10/23/2007  . Smokeless tobacco: Former Systems developer     Comment: Quit x 6 years ago  . Alcohol use No     Allergies   Codeine   Review of Systems Review of Systems  Constitutional: Negative for chills and fever.  Gastrointestinal:       No bowel incontinence.  Genitourinary:       No bladder incontinence.  Musculoskeletal: Positive for arthralgias (right leg) and gait problem (due to pain).  Neurological: Negative for weakness and numbness.     Physical Exam Updated Vital Signs BP (!) 167/108 (BP Location: Left Arm)   Pulse 89   Temp 97.6 F (36.4 C) (Oral)   Resp 18   Ht 5\' 7"  (1.702 m)   Wt 133.8 kg   SpO2 99%   BMI 46.20 kg/m   Physical Exam  Constitutional: She appears well-developed and well-nourished.  HENT:  Head: Normocephalic.  Eyes: Conjunctivae are normal.  Neck: Normal range of motion. Neck supple.  Cardiovascular: Normal rate and intact distal pulses.   Pulses:      Dorsalis pedis pulses are 2+ on the right side, and 2+ on the left side.  Pedal pulses normal.  Pulmonary/Chest: Effort normal.  Abdominal: Soft. Bowel sounds are normal. She exhibits no distension and no mass.  Musculoskeletal: Normal range of motion. She exhibits no edema.       Lumbar back: She exhibits tenderness. She exhibits no swelling, no edema and no spasm.  ttp right paralumbar musculature. Negative SI joint tenderness.  Neurological: She is alert. She has normal strength. She displays no atrophy and no tremor. No sensory deficit. Gait normal.  Reflex Scores:      Patellar reflexes are 1+ on the right side and 1+ on the left side. No strength deficit noted in hip and knee flexor and extensor muscle groups.  Ankle flexion and extension intact. Reduced sensation to RLE in comparison to LLE.   Skin: Skin is warm and dry.  Psychiatric: She has a normal mood and affect.  Nursing note and vitals  reviewed.  ED Treatments / Results  DIAGNOSTIC STUDIES: Oxygen Saturation is 100% on RA, nl by my interpretation.    COORDINATION OF CARE: 12:52 PM Discussed treatment plan with pt at bedside which includes dilaudid injection, zofran, valium injection, and pt agreed to plan.   Labs (all labs ordered are listed, but only abnormal results are displayed) Labs Reviewed - No data to display  EKG  EKG Interpretation None       Radiology No results found.  Procedures Procedures (including critical care time)  Medications Ordered in ED Medications  HYDROmorphone (DILAUDID) injection 1 mg (1 mg Intramuscular Given 04/14/16 1300)  ondansetron (ZOFRAN-ODT) disintegrating tablet 8 mg (8 mg Oral Given 04/14/16 1300)  diazepam (VALIUM) injection 2.5 mg (2.5 mg Intramuscular Given 04/14/16 1302)     Initial Impression / Assessment and Plan / ED Course  I have reviewed the triage vital signs and the nursing notes.  Pertinent labs & imaging results that were available during my care of the patient were reviewed by me and considered in my medical decision making (see chart for details).  Clinical Course    Acute pain exacerbation tx per above with improvement in sx.  She was prescribed valium for muscle spasm, advised caution with sedation. Plan f/u with pcp prn if sx persist or worsen.  No neuro deficit on exam or by history to suggest emergent or surgical presentation.  Discussed worsened sx that should prompt immediate re-evaluation including distal weakness, bowel/bladder retention/incontinence.        Final Clinical Impressions(s) / ED Diagnoses   Final diagnoses:  Sciatica of right side    New Prescriptions Discharge Medication List as of 04/14/2016  1:21 PM    START taking these medications   Details  diazepam (VALIUM) 5 MG tablet Take 1 tablet (5 mg total) by mouth every 8 (eight) hours as needed for muscle spasms., Starting Mon 04/14/2016, Print        I  personally performed the services described in this documentation, which was scribed in my presence. The recorded information has been reviewed and is accurate.     Evalee Jefferson, PA-C 04/16/16 1842    Julianne Rice, MD 04/21/16 [phone]

## 2016-04-24 ENCOUNTER — Emergency Department (HOSPITAL_COMMUNITY)
Admission: EM | Admit: 2016-04-24 | Discharge: 2016-04-24 | Disposition: A | Discharge status: Home / Self Care | Payer: Medicaid Other | Attending: Emergency Medicine | Admitting: Emergency Medicine

## 2016-04-24 ENCOUNTER — Encounter (HOSPITAL_COMMUNITY): Payer: Self-pay | Admitting: Emergency Medicine

## 2016-04-24 DIAGNOSIS — R400 Somnolence: Secondary | ICD-10-CM | POA: Diagnosis not present

## 2016-04-24 DIAGNOSIS — Z87891 Personal history of nicotine dependence: Secondary | ICD-10-CM | POA: Insufficient documentation

## 2016-04-24 DIAGNOSIS — J449 Chronic obstructive pulmonary disease, unspecified: Secondary | ICD-10-CM | POA: Insufficient documentation

## 2016-04-24 DIAGNOSIS — Y828 Other medical devices associated with adverse incidents: Secondary | ICD-10-CM | POA: Insufficient documentation

## 2016-04-24 DIAGNOSIS — T887XXA Unspecified adverse effect of drug or medicament, initial encounter: Secondary | ICD-10-CM | POA: Diagnosis not present

## 2016-04-24 DIAGNOSIS — Z7984 Long term (current) use of oral hypoglycemic drugs: Secondary | ICD-10-CM | POA: Insufficient documentation

## 2016-04-24 DIAGNOSIS — T50905A Adverse effect of unspecified drugs, medicaments and biological substances, initial encounter: Secondary | ICD-10-CM

## 2016-04-24 DIAGNOSIS — T428X5A Adverse effect of antiparkinsonism drugs and other central muscle-tone depressants, initial encounter: Secondary | ICD-10-CM | POA: Diagnosis not present

## 2016-04-24 DIAGNOSIS — Z7982 Long term (current) use of aspirin: Secondary | ICD-10-CM | POA: Diagnosis not present

## 2016-04-24 DIAGNOSIS — Z794 Long term (current) use of insulin: Secondary | ICD-10-CM | POA: Diagnosis not present

## 2016-04-24 DIAGNOSIS — Z79899 Other long term (current) drug therapy: Secondary | ICD-10-CM | POA: Insufficient documentation

## 2016-04-24 DIAGNOSIS — I10 Essential (primary) hypertension: Secondary | ICD-10-CM | POA: Insufficient documentation

## 2016-04-24 LAB — URINALYSIS, ROUTINE W REFLEX MICROSCOPIC
Bilirubin Urine: NEGATIVE
Glucose, UA: >1000 mg/dL — AB
Ketones, ur: NEGATIVE mg/dL
LEUKOCYTES UA: NEGATIVE
NITRITE: NEGATIVE
PH: 6 (ref 5.0–8.0)
Protein, ur: >300 mg/dL — AB
SPECIFIC GRAVITY, URINE: 1.02 (ref 1.005–1.030)

## 2016-04-24 LAB — URINE MICROSCOPIC-ADD ON

## 2016-04-24 NOTE — ED Triage Notes (Signed)
Pt reports she was started on Baclofen 20 mg 2 days ago. Pt states she "feels weird and has been sleeping." Pt states she "feels like I may be overdosing." Pt alert and oriented NAD noted.

## 2016-04-24 NOTE — ED Provider Notes (Signed)
[facility] DEPT Provider Note   CSN: [id number] Arrival date & time: 04/24/16  1603     History   Chief Complaint Chief Complaint  Patient presents with  . Medication Reaction    HPI Martha [name] is a 50 y.o. female.  She presents for evaluation of sleepiness since starting a higher dose of baclofen, 20 mg 4 times a day for muscle cramps. Her PCP increased her dose from 5 to 20 mg every 6 hours, to treat muscle cramps that have been present for several months. She denies fever, chills, cough, chest pain, shortness of breath, nausea or vomiting. He has had some back pain is worried that she has a urinary tract infection. She denies dysuria or hematuria. There are no other known modifying factors.        Marland KitchenHPI  Past Medical History:  Diagnosis Date  . Acid reflux   . Anemia   . Arthritis   . Bipolar 1 disorder ([place])   . Cervical radiculopathy   . COPD (chronic obstructive pulmonary disease) ([place]) 2014   bronchitis  . Degenerative disc disease, thoracic   . Depression   . Diabetes mellitus   . Headache   . Hypertension   . Noncompliance with medication regimen   . Obesity (BMI 30-39.9)   . OSA (obstructive sleep apnea)    cpap  . RLS (restless legs syndrome)   . Shortness of breath     Patient Active Problem List   Diagnosis Date Noted  . Abdominal pain 04/26/2014  . Cholelithiasis 04/26/2014  . Acute renal failure ([place]) 04/26/2014  . Dehydration 04/26/2014  . Hyponatremia 04/26/2014  . Gallstones 04/06/2014  . Uncontrolled diabetes mellitus ([place]) 10/09/2013  . Breast abscess 05/30/2013  . Diabetes mellitus, type 2 ([place]) 05/30/2013  . Hyperglycemia 05/30/2013  . Internal hemorrhoids with other complication 57/84/6962  . Umbilical hernia without mention of obstruction or gangrene 04/15/2013  . Anemia 04/15/2013  . DM 05/13/2010  . HYPERLIPIDEMIA 05/13/2010  . OBESITY 05/13/2010  . DEPRESSION 05/13/2010  . RESTLESS LEG SYNDROME 05/13/2010  .  HYPERTENSION 05/13/2010  . GERD 05/13/2010  . RECTAL BLEEDING 05/13/2010  . SLEEP APNEA 05/13/2010  . Dysphagia, oropharyngeal phase 05/13/2010    Past Surgical History:  Procedure Laterality Date  . BIOPSY N/A 05/17/2013   Procedure: BIOPSY;  Surgeon: [name], MD;  Location: [facility];  Service: Endoscopy;  Laterality: N/A;  . CHOLECYSTECTOMY N/A 04/27/2014   Procedure: LAPAROSCOPIC CHOLECYSTECTOMY;  Surgeon: [name], MD;  Location: [facility];  Service: General;  Laterality: N/A;  . COLONOSCOPY  06/06/2010   XBM:WUXLKG bleeding secondary to internal hemorrhoids but incomplete evaluation secondary to poor right colon prep/small rectal and sigmoid colon polyps (hyperplastic). PROPOFOL  . COLONOSCOPY  May 2013   Dr. Gilliam/[facility]: 5 mm a descending colon polyp, hyperplastic. Adequate bowel prep.  . ENDOMETRIAL ABLATION    . ESOPHAGOGASTRODUODENOSCOPY  06/06/2010   MWN:[id number] erythema and edema of body of stomach, with sessile polypoid lesions. bx benign. no h.pylori  . ESOPHAGOGASTRODUODENOSCOPY (EGD) WITH PROPOFOL N/A 05/17/2013   Dr. Oneida Alar: normal esophagus, moderate nodular gastritis, negative path, empiric Savary dilation  . FLEXIBLE SIGMOIDOSCOPY N/A 05/17/2013   Dr. Oneida Alar: moderate sized internal hemorrhoids  . HEMORRHOID BANDING N/A 05/17/2013   Procedure: HEMORRHOID BANDING;  Surgeon: [name], MD;  Location: [facility];  Service: Endoscopy;  Laterality: N/A;  2 bands placed  . INCISION AND DRAINAGE ABSCESS Left 10/11/2013   Procedure: INCISION AND DRAINAGE AND  DEBRIDEMENT LEFT BREAST  ABSCESS;  Surgeon: [name], MD;  Location: [facility];  Service: General;  Laterality: Left;  . IRRIGATION AND DEBRIDEMENT ABSCESS Right 06/01/2013   Procedure: INCISION AND DRAINAGE AND DEBRIDEMENT ABSCESS RIGHT BREAST;  Surgeon: [name], MD;  Location: [facility];  Service: General;  Laterality: Right;  . SAVORY DILATION N/A 05/17/2013   Procedure: SAVORY DILATION;   Surgeon: [name], MD;  Location: [facility];  Service: Endoscopy;  Laterality: N/A;  14/15/16  . TUBAL LIGATION      OB History    Gravida Para Term Preterm AB Living   2 2 1 1   2    SAB TAB Ectopic Multiple Live Births                   Home Medications    Prior to Admission medications   Medication Sig Start Date End Date Taking? Authorizing Provider  albuterol (PROVENTIL) (2.5 MG/3ML) 0.083% nebulizer solution Take 2.5 mg by nebulization 4 (four) times daily.    Yes Historical Provider, MD  ALPRAZolam Duanne Moron) 1 MG tablet Take 1 mg by mouth 4 (four) times daily.     Yes Historical Provider, MD  amLODipine (NORVASC) 10 MG tablet Take 10 mg by mouth daily.   Yes Historical Provider, MD  aspirin-sod bicarb-citric acid (ALKA-SELTZER) 325 MG TBEF tablet Take 325 mg by mouth every 6 (six) hours as needed (cold).   Yes Historical Provider, MD  diazepam (VALIUM) 5 MG tablet Take 1 tablet (5 mg total) by mouth every 8 (eight) hours as needed for muscle spasms. 04/14/16  Yes Evalee Jefferson, PA-C  diclofenac (VOLTAREN) 75 MG EC tablet Take 1 tablet (75 mg total) by mouth 2 (two) times daily with a meal. 10/17/14  Yes Carole Civil, MD  dicyclomine (BENTYL) 10 MG capsule Take 1 capsule (10 mg total) by mouth 4 (four) times daily -  before meals and at bedtime. 02/09/14  Yes Annitta Needs, NP  DULoxetine (CYMBALTA) 60 MG capsule Take 60 mg by mouth daily.   Yes Historical Provider, MD  esomeprazole (NEXIUM) 40 MG capsule Take 40 mg by mouth every morning.   Yes Historical Provider, MD  gabapentin (NEURONTIN) 300 MG capsule Take 300 mg by mouth 3 (three) times daily.   Yes Historical Provider, MD  HYDROcodone-acetaminophen (NORCO/VICODIN) 5-325 MG tablet Take 1 tablet by mouth every 4 (four) hours as needed. Take with food 03/30/16  Yes Lily Kocher, PA-C  insulin aspart protamine- aspart (NOVOLOG MIX 70/30) (70-30) 100 UNIT/ML injection Inject 1-25 Units into the skin 2 (two) times daily. Per sliding  scale-patient sliding scale   Yes Historical Provider, MD  insulin glargine (LANTUS) 100 UNIT/ML injection Inject 80 Units into the skin 2 (two) times daily.    Yes Historical Provider, MD  lisinopril-hydrochlorothiazide (PRINZIDE,ZESTORETIC) 20-12.5 MG per tablet Take 1 tablet by mouth 2 (two) times daily.    Yes Historical Provider, MD  meloxicam (MOBIC) 15 MG tablet Take 15 mg by mouth daily.   Yes Historical Provider, MD  metFORMIN (GLUCOPHAGE) 1000 MG tablet Take 1,000 mg by mouth 2 (two) times daily with a meal.   Yes Historical Provider, MD  ondansetron (ZOFRAN) 8 MG tablet Take 1 tablet (8 mg total) by mouth every 4 (four) hours as needed. 04/02/16  Yes Nat Christen, MD  Potassium 99 MG TABS Take 1 tablet by mouth daily.   Yes Historical Provider, MD  rOPINIRole (REQUIP) 1 MG tablet Take  1 mg by mouth at bedtime.    Yes Historical Provider, MD  simvastatin (ZOCOR) 40 MG tablet Take 40 mg by mouth every evening.   Yes Historical Provider, MD    Family History Family History  Problem Relation Age of Onset  . Adopted: Yes  . Family history unknown: Yes    Social History Social History  Substance Use Topics  . Smoking status: Former Smoker    Packs/day: 1.50    Years: 25.00    Types: Cigarettes    Quit date: 10/23/2007  . Smokeless tobacco: Former Systems developer     Comment: Quit x 6 years ago  . Alcohol use No     Allergies   Codeine   Review of Systems Review of Systems  All other systems reviewed and are negative.    Physical Exam Updated Vital Signs BP 165/96   Pulse 71   Temp 97.9 F (36.6 C) (Oral)   Resp 18   Ht 5\' 8"  (1.727 m)   Wt 288 lb (130.6 kg)   SpO2 100%   BMI 43.79 kg/m   Physical Exam  Constitutional: She is oriented to person, place, and time. She appears well-developed.  Overweight.  HENT:  Head: Normocephalic and atraumatic.  Eyes: Conjunctivae and EOM are normal. Pupils are equal, round, and reactive to light.  Neck: Normal range of motion and  phonation normal. Neck supple.  Cardiovascular: Normal rate and regular rhythm.   Pulmonary/Chest: Effort normal and breath sounds normal. She exhibits no tenderness.  Abdominal: Soft. She exhibits no distension. There is no tenderness. There is no guarding.  Musculoskeletal: Normal range of motion.  Neurological: She is alert and oriented to person, place, and time. She exhibits normal muscle tone.  No dysarthria and aphasia or nystagmus  Skin: Skin is warm and dry.  Psychiatric: She has a normal mood and affect. Her behavior is normal. Judgment and thought content normal.  Nursing note and vitals reviewed.    ED Treatments / Results  Labs (all labs ordered are listed, but only abnormal results are displayed) Labs Reviewed  URINALYSIS, ROUTINE W REFLEX MICROSCOPIC (NOT AT [facility]) - Abnormal; Notable for the following:       Result Value   Glucose, UA >1000 (*)    Hgb urine dipstick SMALL (*)    Protein, ur >300 (*)    All other components within normal limits  URINE MICROSCOPIC-ADD ON - Abnormal; Notable for the following:    Squamous Epithelial / LPF 6-30 (*)    Bacteria, UA FEW (*)    Casts GRANULAR CAST (*)    All other components within normal limits    EKG  EKG Interpretation None       Radiology No results found.  Procedures Procedures (including critical care time)  Medications Ordered in ED Medications - No data to display   Initial Impression / Assessment and Plan / ED Course  I have reviewed the triage vital signs and the nursing notes.  Pertinent labs & imaging results that were available during my care of the patient were reviewed by me and considered in my medical decision making (see chart for details).  Clinical Course    Medications - No data to display  Patient Vitals for the past 24 hrs:  BP Temp Temp src Pulse Resp SpO2 Height Weight  04/24/16 1809 165/96 - - 71 18 100 % - -  04/24/16 1613 169/98 97.9 F (36.6 C) Oral 101 20 95 % 5\' 8"   (  1.727 m) 288 lb (130.6 kg)    6:49 PM Reevaluation with update and discussion. After initial assessment and treatment, an updated evaluation reveals She remains comfortable. Findings discussed with patient and all questions answered. Elizbeth Posa L    Final Clinical Impressions(s) / ED Diagnoses   Final diagnoses:  Medication reaction, initial encounter   Intolerance of high-dose baclofen. Nonspecific back pain without evidence for urinary tract infection. No indication for further evaluation, at this time.  Nursing Notes Reviewed/ Care Coordinated Applicable Imaging Reviewed Interpretation of Laboratory Data incorporated into ED treatment  The patient appears reasonably screened and/or stabilized for discharge and I doubt any other medical condition or other [facility] requiring further screening, evaluation, or treatment in the ED at this time prior to discharge.  Plan: Home Medications- decrease baclofen dosing to 5 mg every 6-8 hours; Home Treatments- rest; return here if the recommended treatment, does not improve the symptoms; Recommended follow up- PCP, when necessary    New Prescriptions New Prescriptions   No medications on file     Daleen Bo, MD 04/24/16 1851

## 2016-04-24 NOTE — ED Notes (Signed)
Pt reports taking one 20mg  baclofen Wednesday night. Pt states she thinks it was too strong for her and is still sleepy.

## 2016-04-24 NOTE — ED Notes (Signed)
Pt given Dt Coke per request for po fluid trial. Instructed to provide clean catch urine sample when able.

## 2016-04-24 NOTE — Discharge Instructions (Signed)
Decrease the baclofen dosing to 5 mg every 6-8 hours.  Get plenty of rest and drink a lot of fluids.

## 2016-05-25 ENCOUNTER — Encounter (HOSPITAL_COMMUNITY): Payer: Self-pay | Admitting: Emergency Medicine

## 2016-05-25 ENCOUNTER — Observation Stay (HOSPITAL_BASED_OUTPATIENT_CLINIC_OR_DEPARTMENT_OTHER): Payer: Medicaid Other

## 2016-05-25 ENCOUNTER — Observation Stay (HOSPITAL_COMMUNITY): Payer: Medicaid Other

## 2016-05-25 ENCOUNTER — Telehealth: Payer: Self-pay | Admitting: Internal Medicine

## 2016-05-25 ENCOUNTER — Emergency Department (HOSPITAL_COMMUNITY): Payer: Medicaid Other

## 2016-05-25 ENCOUNTER — Inpatient Hospital Stay (HOSPITAL_COMMUNITY)
Admission: EM | Admit: 2016-05-25 | Discharge: 2016-05-26 | DRG: 311 | Disposition: A | Discharge status: Home / Self Care | Payer: Medicaid Other | Attending: Internal Medicine | Admitting: Internal Medicine

## 2016-05-25 DIAGNOSIS — Z79899 Other long term (current) drug therapy: Secondary | ICD-10-CM | POA: Diagnosis not present

## 2016-05-25 DIAGNOSIS — M1990 Unspecified osteoarthritis, unspecified site: Secondary | ICD-10-CM | POA: Diagnosis present

## 2016-05-25 DIAGNOSIS — J449 Chronic obstructive pulmonary disease, unspecified: Secondary | ICD-10-CM | POA: Diagnosis present

## 2016-05-25 DIAGNOSIS — R072 Precordial pain: Secondary | ICD-10-CM | POA: Diagnosis not present

## 2016-05-25 DIAGNOSIS — N179 Acute kidney failure, unspecified: Secondary | ICD-10-CM | POA: Diagnosis present

## 2016-05-25 DIAGNOSIS — K219 Gastro-esophageal reflux disease without esophagitis: Secondary | ICD-10-CM | POA: Diagnosis present

## 2016-05-25 DIAGNOSIS — G4733 Obstructive sleep apnea (adult) (pediatric): Secondary | ICD-10-CM | POA: Diagnosis present

## 2016-05-25 DIAGNOSIS — G2581 Restless legs syndrome: Secondary | ICD-10-CM | POA: Diagnosis present

## 2016-05-25 DIAGNOSIS — E1122 Type 2 diabetes mellitus with diabetic chronic kidney disease: Secondary | ICD-10-CM | POA: Diagnosis present

## 2016-05-25 DIAGNOSIS — E7800 Pure hypercholesterolemia, unspecified: Secondary | ICD-10-CM | POA: Diagnosis not present

## 2016-05-25 DIAGNOSIS — E119 Type 2 diabetes mellitus without complications: Secondary | ICD-10-CM

## 2016-05-25 DIAGNOSIS — E785 Hyperlipidemia, unspecified: Secondary | ICD-10-CM | POA: Diagnosis present

## 2016-05-25 DIAGNOSIS — G4730 Sleep apnea, unspecified: Secondary | ICD-10-CM | POA: Diagnosis present

## 2016-05-25 DIAGNOSIS — Z87891 Personal history of nicotine dependence: Secondary | ICD-10-CM | POA: Diagnosis not present

## 2016-05-25 DIAGNOSIS — I200 Unstable angina: Principal | ICD-10-CM | POA: Diagnosis present

## 2016-05-25 DIAGNOSIS — I5032 Chronic diastolic (congestive) heart failure: Secondary | ICD-10-CM | POA: Diagnosis present

## 2016-05-25 DIAGNOSIS — R079 Chest pain, unspecified: Secondary | ICD-10-CM | POA: Diagnosis present

## 2016-05-25 DIAGNOSIS — N189 Chronic kidney disease, unspecified: Secondary | ICD-10-CM

## 2016-05-25 DIAGNOSIS — N170 Acute kidney failure with tubular necrosis: Secondary | ICD-10-CM | POA: Diagnosis not present

## 2016-05-25 DIAGNOSIS — Z794 Long term (current) use of insulin: Secondary | ICD-10-CM | POA: Diagnosis not present

## 2016-05-25 DIAGNOSIS — Z6841 Body Mass Index (BMI) 40.0 and over, adult: Secondary | ICD-10-CM

## 2016-05-25 DIAGNOSIS — F418 Other specified anxiety disorders: Secondary | ICD-10-CM | POA: Diagnosis present

## 2016-05-25 DIAGNOSIS — E1165 Type 2 diabetes mellitus with hyperglycemia: Secondary | ICD-10-CM | POA: Diagnosis present

## 2016-05-25 DIAGNOSIS — I10 Essential (primary) hypertension: Secondary | ICD-10-CM | POA: Diagnosis present

## 2016-05-25 DIAGNOSIS — F419 Anxiety disorder, unspecified: Secondary | ICD-10-CM | POA: Diagnosis present

## 2016-05-25 DIAGNOSIS — F319 Bipolar disorder, unspecified: Secondary | ICD-10-CM | POA: Diagnosis present

## 2016-05-25 DIAGNOSIS — I130 Hypertensive heart and chronic kidney disease with heart failure and stage 1 through stage 4 chronic kidney disease, or unspecified chronic kidney disease: Secondary | ICD-10-CM | POA: Diagnosis present

## 2016-05-25 LAB — CBC
HEMATOCRIT: 37.8 % (ref 36.0–46.0)
HEMOGLOBIN: 12.8 g/dL (ref 12.0–15.0)
MCH: 27.8 pg (ref 26.0–34.0)
MCHC: 33.9 g/dL (ref 30.0–36.0)
MCV: 82 fL (ref 78.0–100.0)
Platelets: 210 10*3/uL (ref 150–400)
RBC: 4.61 MIL/uL (ref 3.87–5.11)
RDW: 15.4 % (ref 11.5–15.5)
WBC: 8.7 10*3/uL (ref 4.0–10.5)

## 2016-05-25 LAB — URINALYSIS, ROUTINE W REFLEX MICROSCOPIC
BILIRUBIN URINE: NEGATIVE
Glucose, UA: >1000 mg/dL — AB
Ketones, ur: NEGATIVE mg/dL
Leukocytes, UA: NEGATIVE
NITRITE: NEGATIVE
Protein, ur: >300 mg/dL — AB
SPECIFIC GRAVITY, URINE: 1.02 (ref 1.005–1.030)
pH: 6.5 (ref 5.0–8.0)

## 2016-05-25 LAB — BASIC METABOLIC PANEL
Anion gap: 7 (ref 5–15)
Anion gap: 9 (ref 5–15)
BUN: 48 mg/dL — ABNORMAL HIGH (ref 6–20)
BUN: 46 mg/dL — AB (ref 6–20)
CHLORIDE: 105 mmol/L (ref 101–111)
CHLORIDE: 108 mmol/L (ref 101–111)
CO2: 25 mmol/L (ref 22–32)
CO2: 20 mmol/L — AB (ref 22–32)
CREATININE: 3.67 mg/dL — AB (ref 0.44–1.00)
Calcium: 9.1 mg/dL (ref 8.9–10.3)
Calcium: 8.6 mg/dL — ABNORMAL LOW (ref 8.9–10.3)
Creatinine, Ser: 4.04 mg/dL — ABNORMAL HIGH (ref 0.44–1.00)
GFR calc Af Amer: 16 mL/min — ABNORMAL LOW (ref >60)
GFR calc non Af Amer: 12 mL/min — ABNORMAL LOW (ref >60)
GFR calc non Af Amer: 14 mL/min — ABNORMAL LOW (ref >60)
GFR, EST AFRICAN AMERICAN: 14 mL/min — AB (ref >60)
Glucose, Bld: 268 mg/dL — ABNORMAL HIGH (ref 65–99)
Glucose, Bld: 253 mg/dL — ABNORMAL HIGH (ref 65–99)
POTASSIUM: 4.2 mmol/L (ref 3.5–5.1)
Potassium: 3.9 mmol/L (ref 3.5–5.1)
SODIUM: 137 mmol/L (ref 135–145)
Sodium: 137 mmol/L (ref 135–145)

## 2016-05-25 LAB — GLUCOSE, CAPILLARY
GLUCOSE-CAPILLARY: 238 mg/dL — AB (ref 65–99)
GLUCOSE-CAPILLARY: 251 mg/dL — AB (ref 65–99)
Glucose-Capillary: 248 mg/dL — ABNORMAL HIGH (ref 65–99)
Glucose-Capillary: 256 mg/dL — ABNORMAL HIGH (ref 65–99)

## 2016-05-25 LAB — HEPATIC FUNCTION PANEL
ALK PHOS: 71 U/L (ref 38–126)
ALT: 13 U/L — ABNORMAL LOW (ref 14–54)
AST: 12 U/L — ABNORMAL LOW (ref 15–41)
Albumin: 2.2 g/dL — ABNORMAL LOW (ref 3.5–5.0)
BILIRUBIN TOTAL: 0.3 mg/dL (ref 0.3–1.2)
Bilirubin, Direct: <0.1 mg/dL — ABNORMAL LOW (ref 0.1–0.5)
Total Protein: 5.1 g/dL — ABNORMAL LOW (ref 6.5–8.1)

## 2016-05-25 LAB — URINE MICROSCOPIC-ADD ON

## 2016-05-25 LAB — I-STAT TROPONIN, ED: Troponin i, poc: 0 ng/mL (ref 0.00–0.08)

## 2016-05-25 LAB — ECHOCARDIOGRAM COMPLETE
HEIGHTINCHES: 67 in
WEIGHTICAEL: 4795.2 [oz_av]

## 2016-05-25 LAB — TROPONIN I

## 2016-05-25 LAB — HEPARIN LEVEL (UNFRACTIONATED)
HEPARIN UNFRACTIONATED: 0.59 [IU]/mL (ref 0.30–0.70)
Heparin Unfractionated: 0.13 IU/mL — ABNORMAL LOW (ref 0.30–0.70)

## 2016-05-25 MED ORDER — NITROGLYCERIN 0.4 MG SL SUBL
0.4000 mg | SUBLINGUAL_TABLET | SUBLINGUAL | Status: DC | PRN
  Administered 2016-05-25: 0.4 mg via SUBLINGUAL
  Filled 2016-05-25 (×2): qty 1

## 2016-05-25 MED ORDER — GABAPENTIN 300 MG PO CAPS: 300.0000 mg | ORAL_CAPSULE | Freq: Three times a day (TID) | ORAL | Status: DC

## 2016-05-25 MED ORDER — INSULIN ASPART 100 UNIT/ML ~~LOC~~ SOLN
0.0000 [IU] | Freq: Every day | SUBCUTANEOUS | Status: DC
  Administered 2016-05-25: 3 [IU] via SUBCUTANEOUS

## 2016-05-25 MED ORDER — PANTOPRAZOLE SODIUM 40 MG PO TBEC
40.0000 mg | DELAYED_RELEASE_TABLET | Freq: Every day | ORAL | Status: DC
  Administered 2016-05-25 – 2016-05-26 (×2): 40 mg via ORAL
  Filled 2016-05-25 (×2): qty 1

## 2016-05-25 MED ORDER — ACETAMINOPHEN 325 MG PO TABS: 650.0000 mg | ORAL_TABLET | ORAL | Status: DC | PRN

## 2016-05-25 MED ORDER — SIMVASTATIN 40 MG PO TABS: 40.0000 mg | ORAL_TABLET | Freq: Every evening | ORAL | Status: DC

## 2016-05-25 MED ORDER — ASPIRIN 325 MG PO TABS
325.0000 mg | ORAL_TABLET | Freq: Every day | ORAL | Status: DC
  Administered 2016-05-26: 325 mg via ORAL
  Filled 2016-05-25: qty 1

## 2016-05-25 MED ORDER — POTASSIUM 99 MG PO TABS: 1.0000 | ORAL_TABLET | Freq: Every day | ORAL | Status: DC

## 2016-05-25 MED ORDER — INSULIN GLARGINE 100 UNIT/ML ~~LOC~~ SOLN
80.0000 [IU] | Freq: Two times a day (BID) | SUBCUTANEOUS | Status: DC
  Administered 2016-05-25 – 2016-05-26 (×3): 80 [IU] via SUBCUTANEOUS
  Filled 2016-05-25 (×4): qty 0.8

## 2016-05-25 MED ORDER — DULOXETINE HCL 60 MG PO CPEP
60.0000 mg | ORAL_CAPSULE | Freq: Every day | ORAL | Status: DC
  Administered 2016-05-25 – 2016-05-26 (×2): 60 mg via ORAL
  Filled 2016-05-25 (×2): qty 1

## 2016-05-25 MED ORDER — ASPIRIN 81 MG PO CHEW
324.0000 mg | CHEWABLE_TABLET | Freq: Once | ORAL | Status: AC
  Administered 2016-05-25: 324 mg via ORAL
  Filled 2016-05-25: qty 4

## 2016-05-25 MED ORDER — ONDANSETRON HCL 4 MG PO TABS: 4.0000 mg | ORAL_TABLET | Freq: Four times a day (QID) | ORAL | Status: DC | PRN

## 2016-05-25 MED ORDER — DICYCLOMINE HCL 10 MG PO CAPS
10.0000 mg | ORAL_CAPSULE | Freq: Three times a day (TID) | ORAL | Status: DC
  Administered 2016-05-25 – 2016-05-26 (×6): 10 mg via ORAL
  Filled 2016-05-25 (×8): qty 1

## 2016-05-25 MED ORDER — ONDANSETRON HCL 4 MG/2ML IJ SOLN: 4.0000 mg | Freq: Four times a day (QID) | INTRAMUSCULAR | Status: DC | PRN

## 2016-05-25 MED ORDER — METOPROLOL TARTRATE 50 MG PO TABS
50.0000 mg | ORAL_TABLET | Freq: Two times a day (BID) | ORAL | Status: DC
  Administered 2016-05-25 – 2016-05-26 (×3): 50 mg via ORAL
  Filled 2016-05-25 (×3): qty 1

## 2016-05-25 MED ORDER — INSULIN ASPART 100 UNIT/ML ~~LOC~~ SOLN: 0.0000 [IU] | Freq: Three times a day (TID) | SUBCUTANEOUS | Status: DC

## 2016-05-25 MED ORDER — HEPARIN BOLUS VIA INFUSION
4000.0000 [IU] | Freq: Once | INTRAVENOUS | Status: AC
  Administered 2016-05-25: 4000 [IU] via INTRAVENOUS

## 2016-05-25 MED ORDER — GABAPENTIN 100 MG PO CAPS
100.0000 mg | ORAL_CAPSULE | Freq: Three times a day (TID) | ORAL | Status: DC
  Administered 2016-05-25 – 2016-05-26 (×4): 100 mg via ORAL
  Filled 2016-05-25 (×4): qty 1

## 2016-05-25 MED ORDER — INSULIN ASPART 100 UNIT/ML ~~LOC~~ SOLN
0.0000 [IU] | Freq: Three times a day (TID) | SUBCUTANEOUS | Status: DC
  Administered 2016-05-25: 7 [IU] via SUBCUTANEOUS
  Administered 2016-05-25: 11 [IU] via SUBCUTANEOUS
  Administered 2016-05-25: 7 [IU] via SUBCUTANEOUS
  Administered 2016-05-26: 4 [IU] via SUBCUTANEOUS
  Administered 2016-05-26: 3 [IU] via SUBCUTANEOUS

## 2016-05-25 MED ORDER — LISINOPRIL-HYDROCHLOROTHIAZIDE 20-12.5 MG PO TABS: 1.0000 | ORAL_TABLET | Freq: Two times a day (BID) | ORAL | Status: DC

## 2016-05-25 MED ORDER — DICLOFENAC SODIUM 75 MG PO TBEC
75.0000 mg | DELAYED_RELEASE_TABLET | Freq: Two times a day (BID) | ORAL | Status: DC
  Administered 2016-05-25: 75 mg via ORAL
  Filled 2016-05-25: qty 1

## 2016-05-25 MED ORDER — HEPARIN (PORCINE) IN NACL 100-0.45 UNIT/ML-% IJ SOLN
1600.0000 [IU]/h | INTRAMUSCULAR | Status: DC
  Administered 2016-05-25: 1350 [IU]/h via INTRAVENOUS
  Administered 2016-05-25: 1600 [IU]/h via INTRAVENOUS
  Filled 2016-05-25 (×2): qty 250

## 2016-05-25 MED ORDER — SODIUM CHLORIDE 0.9 % IV SOLN
INTRAVENOUS | Status: DC
  Administered 2016-05-25: 19:00:00 via INTRAVENOUS

## 2016-05-25 MED ORDER — METFORMIN HCL 500 MG PO TABS: 1000.0000 mg | ORAL_TABLET | Freq: Two times a day (BID) | ORAL | Status: DC

## 2016-05-25 MED ORDER — AMLODIPINE BESYLATE 10 MG PO TABS
10.0000 mg | ORAL_TABLET | Freq: Every day | ORAL | Status: DC
  Administered 2016-05-25 – 2016-05-26 (×2): 10 mg via ORAL
  Filled 2016-05-25 (×2): qty 1

## 2016-05-25 MED ORDER — ALPRAZOLAM 0.5 MG PO TABS
1.0000 mg | ORAL_TABLET | Freq: Four times a day (QID) | ORAL | Status: DC
  Administered 2016-05-25 – 2016-05-26 (×5): 1 mg via ORAL
  Filled 2016-05-25 (×5): qty 2

## 2016-05-25 MED ORDER — HYDROCODONE-ACETAMINOPHEN 5-325 MG PO TABS: 1.0000 | ORAL_TABLET | ORAL | Status: DC | PRN

## 2016-05-25 MED ORDER — LISINOPRIL 20 MG PO TABS: 20.0000 mg | ORAL_TABLET | Freq: Two times a day (BID) | ORAL | Status: DC

## 2016-05-25 MED ORDER — ALBUTEROL SULFATE (2.5 MG/3ML) 0.083% IN NEBU
2.5000 mg | INHALATION_SOLUTION | Freq: Four times a day (QID) | RESPIRATORY_TRACT | Status: DC
  Administered 2016-05-25 – 2016-05-26 (×4): 2.5 mg via RESPIRATORY_TRACT
  Filled 2016-05-25 (×5): qty 3

## 2016-05-25 MED ORDER — SODIUM CHLORIDE 0.9% FLUSH
3.0000 mL | Freq: Two times a day (BID) | INTRAVENOUS | Status: DC
  Administered 2016-05-25 – 2016-05-26 (×3): 3 mL via INTRAVENOUS

## 2016-05-25 MED ORDER — HYDROCHLOROTHIAZIDE 12.5 MG PO CAPS: 12.5000 mg | ORAL_CAPSULE | Freq: Two times a day (BID) | ORAL | Status: DC

## 2016-05-25 MED ORDER — ASPIRIN EFFERVESCENT 325 MG PO TBEF: 325.0000 mg | EFFERVESCENT_TABLET | Freq: Four times a day (QID) | ORAL | Status: DC | PRN

## 2016-05-25 MED ORDER — ROPINIROLE HCL 1 MG PO TABS
1.0000 mg | ORAL_TABLET | Freq: Every day | ORAL | Status: DC
  Administered 2016-05-25: 1 mg via ORAL
  Filled 2016-05-25: qty 1

## 2016-05-25 MED ORDER — HEPARIN BOLUS VIA INFUSION
2000.0000 [IU] | Freq: Once | INTRAVENOUS | Status: AC
  Administered 2016-05-25: 2000 [IU] via INTRAVENOUS
  Filled 2016-05-25: qty 2000

## 2016-05-25 MED ORDER — SODIUM CHLORIDE 0.9 % IV SOLN
INTRAVENOUS | Status: DC
  Administered 2016-05-25: 03:00:00 via INTRAVENOUS
  Administered 2016-05-25: 100 mL/h via INTRAVENOUS

## 2016-05-25 MED ORDER — INSULIN ASPART PROT & ASPART (70-30 MIX) 100 UNIT/ML ~~LOC~~ SUSP: 1.0000 [IU] | Freq: Two times a day (BID) | SUBCUTANEOUS | Status: DC

## 2016-05-25 MED ORDER — ATORVASTATIN CALCIUM 20 MG PO TABS
20.0000 mg | ORAL_TABLET | Freq: Every day | ORAL | Status: DC
  Administered 2016-05-25: 20 mg via ORAL
  Filled 2016-05-25: qty 1

## 2016-05-25 MED ORDER — NITROGLYCERIN IN D5W 200-5 MCG/ML-% IV SOLN
5.0000 ug/min | INTRAVENOUS | Status: DC
  Administered 2016-05-25: 5 ug/min via INTRAVENOUS
  Filled 2016-05-25: qty 250

## 2016-05-25 NOTE — ED Triage Notes (Signed)
Reports 2 day history of Cp with L side numbness- Pt physician is Dr Luan Pulling- took her hydrocodone and then the pain returned

## 2016-05-25 NOTE — Progress Notes (Signed)
ANTICOAGULATION CONSULT NOTE - Initial Consult  Pharmacy Consult for HEPARIN Indication: chest pain/ACS  Allergies  Allergen Reactions  . Codeine Nausea And Vomiting    Tolerates the medication if taken with food   Patient Measurements: Height: 5\' 7"  (170.2 cm) Weight: 300 lb (136.1 kg) IBW/kg (Calculated) : 61.6 HEPARIN DW (KG): 94.7  Vital Signs: Temp: 97.8 F (36.6 C) (10/29 0223) Temp Source: Oral (10/29 0223) BP: 159/103 (10/29 0301) Pulse Rate: 80 (10/29 0310)  Labs:  Recent Labs  05/25/16 0229  HGB 12.8  HCT 37.8  PLT 210  CREATININE 4.04*    Estimated Creatinine Clearance: 24.6 mL/min (by C-G formula based on SCr of 4.04 mg/dL (H)).   Medical History: Past Medical History:  Diagnosis Date  . Acid reflux   . Anemia   . Arthritis   . Bipolar 1 disorder ([place])   . Cervical radiculopathy   . COPD (chronic obstructive pulmonary disease) ([place]) 2014   bronchitis  . Degenerative disc disease, thoracic   . Depression   . Diabetes mellitus   . Headache   . Hypertension   . Noncompliance with medication regimen   . Obesity (BMI 30-39.9)   . OSA (obstructive sleep apnea)    cpap  . RLS (restless legs syndrome)   . Shortness of breath    Medications:   (Not in a hospital admission)  Assessment: 50yo morbidly obese female c/o CP.  Asked to initiate Heparin for ACS.  No anticoagulants listed on PTA med list.  Baseline labs WNL.  Goal of Therapy:  Heparin level 0.3-0.7 units/ml Monitor platelets by anticoagulation protocol: Yes   Plan:   Heparin 4000 units IV now x 1  Heparin infusion at 1350 units/hr  Heparin level in 6-8 hrs then daily  CBC daily while on Heparin   [place] Crane, Mette [place]gate A 05/25/2016,3:25 AM

## 2016-05-25 NOTE — ED Notes (Signed)
Pt actual weight is 277.6 lbs

## 2016-05-25 NOTE — ED Notes (Signed)
Pt reports that her pain is gone and that she is frightened. Reassurances and active listening - spouse is at her bedside

## 2016-05-25 NOTE — ED Notes (Signed)
To radiology

## 2016-05-25 NOTE — Consult Note (Signed)
CARDIOLOGY CONSULT NOTE  Patient ID: Martha [name] MRN: [id number] DOB/AGE: 10/08/1965 50 y.o.  Admit date: 05/25/2016 Primary Physician Alonza Bogus, MD Primary Cardiologist None Chief Complaint  Chest pain.   Requesting  Dr. Thereasa Solo  HPI:  The patient has multiple cardiovascular risk factors but no past cardiac history.  She reports that she has been having pain for the past couple of days.  She reports that it is mid chest pain with radiation to her left arm and hand numbness.  She reports that it is sharp.  She had nausea but no vomiting.  She has not had this pain before.  She said that it was somewhat positional with increased symptoms with lying flat.  She is very sedentary because she has chronic DOE.  However, the pain did not seem to be exertional.  She does report that she has not been able to walk for a week because "everything is cramping."  She has not had diaphoresis but has felt "cold and hot."  She said that she couldn't sleep because of SOB.  She does use CPAP.   She reports increased generalized swelling.  She went the [facility] ED.  She has had inferior and lateral T wave inversion on her EKG that were new compared with a previous EKG.  However,  her enzymes were negative.  However her pain went away with SLNTG.  She was transferred.   She has had an echo in 2015 with normal LV function and wall motion.  She has had mild aortic valve sclerosis.  Of note she has acute on chronic renal insufficiency and is to be seen by nephrology.     Past Medical History:  Diagnosis Date  . Acid reflux   . Anemia   . Arthritis   . Bipolar 1 disorder ([place])   . Cervical radiculopathy   . COPD (chronic obstructive pulmonary disease) ([place]) 2014   bronchitis  . Degenerative disc disease, thoracic   . Depression   . Diabetes mellitus   . Headache   . Hypertension   . Noncompliance with medication regimen   . Obesity (BMI 30-39.9)   . OSA (obstructive sleep apnea)    cpap  . RLS  (restless legs syndrome)   . Shortness of breath     Past Surgical History:  Procedure Laterality Date  . BIOPSY N/A 05/17/2013   Procedure: BIOPSY;  Surgeon: [name], MD;  Location: [facility];  Service: Endoscopy;  Laterality: N/A;  . CHOLECYSTECTOMY N/A 04/27/2014   Procedure: LAPAROSCOPIC CHOLECYSTECTOMY;  Surgeon: [name], MD;  Location: [facility];  Service: General;  Laterality: N/A;  . COLONOSCOPY  06/06/2010   HBZ:JIRCVE bleeding secondary to internal hemorrhoids but incomplete evaluation secondary to poor right colon prep/small rectal and sigmoid colon polyps (hyperplastic). PROPOFOL  . COLONOSCOPY  May 2013   Dr. Gilliam/[facility]: 5 mm a descending colon polyp, hyperplastic. Adequate bowel prep.  . ENDOMETRIAL ABLATION    . ESOPHAGOGASTRODUODENOSCOPY  06/06/2010   LFY:BOFBPZW erythema and edema of body of stomach, with sessile polypoid lesions. bx benign. no h.pylori  . ESOPHAGOGASTRODUODENOSCOPY (EGD) WITH PROPOFOL N/A 05/17/2013   Dr. Oneida Alar: normal esophagus, moderate nodular gastritis, negative path, empiric Savary dilation  . FLEXIBLE SIGMOIDOSCOPY N/A 05/17/2013   Dr. Oneida Alar: moderate sized internal hemorrhoids  . HEMORRHOID BANDING N/A 05/17/2013   Procedure: HEMORRHOID BANDING;  Surgeon: [name], MD;  Location: [facility];  Service: Endoscopy;  Laterality: N/A;  2 bands placed  . INCISION  AND DRAINAGE ABSCESS Left 10/11/2013   Procedure: INCISION AND DRAINAGE AND DEBRIDEMENT LEFT BREAST  ABSCESS;  Surgeon: [name], MD;  Location: [facility];  Service: General;  Laterality: Left;  . IRRIGATION AND DEBRIDEMENT ABSCESS Right 06/01/2013   Procedure: INCISION AND DRAINAGE AND DEBRIDEMENT ABSCESS RIGHT BREAST;  Surgeon: [name], MD;  Location: [facility];  Service: General;  Laterality: Right;  . SAVORY DILATION N/A 05/17/2013   Procedure: SAVORY DILATION;  Surgeon: [name], MD;  Location: [facility];  Service: Endoscopy;  Laterality: N/A;  14/15/16  .  TUBAL LIGATION      Allergies  Allergen Reactions  . Codeine Nausea And Vomiting    Tolerates the medication if taken with food   Prescriptions Prior to Admission  Medication Sig Dispense Refill Last Dose  . albuterol (PROVENTIL) (2.5 MG/3ML) 0.083% nebulizer solution Take 2.5 mg by nebulization 4 (four) times daily.    05/24/2016 at pm  . ALPRAZolam (XANAX) 1 MG tablet Take 1 mg by mouth 4 (four) times daily.     05/24/2016 at pm  . amLODipine (NORVASC) 10 MG tablet Take 10 mg by mouth at bedtime.    05/24/2016 at pm  . baclofen (LIORESAL) 20 MG tablet Take 20 mg by mouth 3 (three) times daily as needed for muscle spasms.   05/24/2016 at Unknown time  . diclofenac (VOLTAREN) 75 MG EC tablet Take 1 tablet (75 mg total) by mouth 2 (two) times daily with a meal. 60 tablet 2 05/24/2016 at pm  . dicyclomine (BENTYL) 10 MG capsule Take 1 capsule (10 mg total) by mouth 4 (four) times daily -  before meals and at bedtime. 120 capsule 3 05/24/2016 at pm  . doxycycline (VIBRAMYCIN) 100 MG capsule Take 100 mg by mouth 2 (two) times daily.   05/24/2016 at pm  . DULoxetine (CYMBALTA) 60 MG capsule Take 60 mg by mouth at bedtime.    05/24/2016 at pm  . esomeprazole (NEXIUM) 40 MG capsule Take 40 mg by mouth at bedtime.    05/24/2016 at Unknown time  . gabapentin (NEURONTIN) 300 MG capsule Take 300 mg by mouth 3 (three) times daily.   05/24/2016 at pm  . HYDROcodone-acetaminophen (NORCO/VICODIN) 5-325 MG tablet Take 1 tablet by mouth every 4 (four) hours as needed. Take with food (Patient taking differently: Take 1 tablet by mouth every 4 (four) hours as needed (pain). Take with food) 15 tablet 0 05/24/2016 at pm  . insulin aspart protamine- aspart (NOVOLOG MIX 70/30) (70-30) 100 UNIT/ML injection Inject 15-25 Units into the skin 3 (three) times daily before meals.   05/24/2016 at lunch  . insulin glargine (LANTUS) 100 unit/mL SOPN Inject 80 Units into the skin 2 (two) times daily.   05/24/2016 at am  .  metoprolol succinate (TOPROL-XL) 100 MG 24 hr tablet Take 100 mg by mouth at bedtime. Take with or immediately following a meal.   05/24/2016 at 2200  . Potassium 99 MG TABS Take 99 mg by mouth at bedtime.    05/24/2016 at pm  . PRESCRIPTION MEDICATION Inhale into the lungs at bedtime. CPAP   05/24/2016 at Unknown time  . simvastatin (ZOCOR) 40 MG tablet Take 40 mg by mouth at bedtime.    05/24/2016 at pm  . lisinopril-hydrochlorothiazide (PRINZIDE,ZESTORETIC) 20-12.5 MG per tablet Take 1 tablet by mouth 2 (two) times daily.    05/24/2016 at pm  . metFORMIN (GLUCOPHAGE) 1000 MG tablet Take 1,000 mg by mouth 2 (two) times  daily with a meal.   05/24/2016 at pm  . rOPINIRole (REQUIP) 1 MG tablet Take 1 mg by mouth at bedtime.    05/24/2016 at pm   Family History  Problem Relation Age of Onset  . Adopted: Yes  . Family history unknown: Yes    Social History   Social History  . Marital status: Divorced    Spouse name: N/A  . Number of children: N/A  . Years of education: N/A   Occupational History  . Not on file.   Social History Main Topics  . Smoking status: Former Smoker    Packs/day: 1.50    Years: 25.00    Types: Cigarettes    Quit date: 10/23/2007  . Smokeless tobacco: Former Systems developer     Comment: Quit x 6 years ago  . Alcohol use No  . Drug use: No  . Sexual activity: Yes    Birth control/ protection: Surgical   Other Topics Concern  . Not on file   Social History Narrative  . No narrative on file     ROS:    As stated in the HPI and negative for all other systems.  Physical Exam: Blood pressure (!) 162/84, pulse 69, temperature 97.8 F (36.6 C), temperature source Axillary, resp. rate 14, height 5\' 7"  (1.702 m), weight 299 lb 11.2 oz (135.9 kg), SpO2 98 %.GENERAL:  Well appearing HEENT:  Pupils equal round and reactive, fundi not visualized, oral mucosa unremarkable NECK:  No jugular venous distention, waveform within normal limits, carotid upstroke brisk and  symmetric, no bruits, no thyromegaly LYMPHATICS:  No cervical, inguinal adenopathy LUNGS:  Clear to auscultation bilaterally BACK:  No CVA tenderness CHEST:  Unremarkable HEART:  PMI not displaced or sustained,S1 and S2 within normal limits, no S3, no S4, no clicks, no rubs, no murmurs ABD:  Flat, positive bowel sounds normal in frequency in pitch, no bruits, no rebound, no guarding, no midline pulsatile mass, no hepatomegaly, no splenomegaly EXT:  2 plus pulses throughout, no edema, no cyanosis no clubbing SKIN:  No rashes no nodules NEURO:  Cranial nerves II through XII grossly intact, motor grossly intact throughout PSYCH:  Cognitively intact, oriented to person place and time  Labs: Lab Results  Component Value Date   BUN 48 (H) 05/25/2016   Lab Results  Component Value Date   CREATININE 4.04 (H) 05/25/2016   Lab Results  Component Value Date   NA 137 05/25/2016   K 4.2 05/25/2016   CL 105 05/25/2016   CO2 25 05/25/2016   Lab Results  Component Value Date   TROPONINI <0.03 05/25/2016   Lab Results  Component Value Date   WBC 8.7 05/25/2016   HGB 12.8 05/25/2016   HCT 37.8 05/25/2016   MCV 82.0 05/25/2016   PLT 210 05/25/2016   No results found for: CHOL, HDL, LDLCALC, LDLDIRECT, TRIG, CHOLHDL Lab Results  Component Value Date   ALT 11 (L) 04/02/2016   AST 12 (L) 04/02/2016   ALKPHOS 90 04/02/2016   BILITOT 0.4 04/02/2016      Radiology:   CXR: The heart size and mediastinal contours are within normal limits. Both lungs are clear. The visualized skeletal structures are Unremarkable.  EKG:NSR, rate 70, inferior and lateral T wave inversion new compared with a previous EKG.   ASSESSMENT AND PLAN:   CHEST PAIN:   Her pain is atypical.  However, she does have significant risk factors and she has an abnormal EKG.  I would like  to check an echocardiogram.  If her enzymes remain negative and she has no change on her echo she could have a non invasive evaluation.   She has CKD and would not be a good   HTN:    BP is elevated.  Off of ACE inhibitor.  Likely will need hydralziine for BP control.  Can wean IV NTG.     SignedMinus Breeding 05/25/2016, 1:17 PM

## 2016-05-25 NOTE — ED Notes (Signed)
Hospitalist in to assess

## 2016-05-25 NOTE — ED Provider Notes (Addendum)
TIME SEEN: 2:40 AM  CHIEF COMPLAINT: Chest pain  HPI: Pt is a 50 y.o. female with history of obesity, previous tobacco use who quit in 2009, hypertension, diabetes, hyperlipidemia who presents emergency department with left-sided chest pain that started at rest around midnight. Describes as a sharp pain with tightness. No radiation of pain but did feel short of breath and felt like her left arm was numb. She had nausea, dizziness and sweating with this episode. She has never had a stress test or cardiac catheterization. No recent lower extremity swelling but has had pain from "muscle cramps". No fever or cough. Does have a family history of a father who had an MI at 80 years old. She denies a history of PE or DVT.  PCP is Dr. Luan Pulling. She does not have a cardiologist.  ROS: See HPI Constitutional: no fever  Eyes: no drainage  ENT: no runny nose   Cardiovascular:   chest pain  Resp: SOB  GI: no vomiting GU: no dysuria Integumentary: no rash  Allergy: no hives  Musculoskeletal: no leg swelling  Neurological: no slurred speech ROS otherwise negative  PAST MEDICAL HISTORY/PAST SURGICAL HISTORY:  Past Medical History:  Diagnosis Date  . Acid reflux   . Anemia   . Arthritis   . Bipolar 1 disorder ([place])   . Cervical radiculopathy   . COPD (chronic obstructive pulmonary disease) ([place]) 2014   bronchitis  . Degenerative disc disease, thoracic   . Depression   . Diabetes mellitus   . Headache   . Hypertension   . Noncompliance with medication regimen   . Obesity (BMI 30-39.9)   . OSA (obstructive sleep apnea)    cpap  . RLS (restless legs syndrome)   . Shortness of breath     MEDICATIONS:  Prior to Admission medications   Medication Sig Start Date End Date Taking? Authorizing Provider  albuterol (PROVENTIL) (2.5 MG/3ML) 0.083% nebulizer solution Take 2.5 mg by nebulization 4 (four) times daily.     Historical Provider, MD  ALPRAZolam Duanne Moron) 1 MG tablet Take 1 mg by mouth 4  (four) times daily.      Historical Provider, MD  amLODipine (NORVASC) 10 MG tablet Take 10 mg by mouth daily.    Historical Provider, MD  aspirin-sod bicarb-citric acid (ALKA-SELTZER) 325 MG TBEF tablet Take 325 mg by mouth every 6 (six) hours as needed (cold).    Historical Provider, MD  diazepam (VALIUM) 5 MG tablet Take 1 tablet (5 mg total) by mouth every 8 (eight) hours as needed for muscle spasms. 04/14/16   Evalee Jefferson, PA-C  diclofenac (VOLTAREN) 75 MG EC tablet Take 1 tablet (75 mg total) by mouth 2 (two) times daily with a meal. 10/17/14   Carole Civil, MD  dicyclomine (BENTYL) 10 MG capsule Take 1 capsule (10 mg total) by mouth 4 (four) times daily -  before meals and at bedtime. 02/09/14   Annitta Needs, NP  DULoxetine (CYMBALTA) 60 MG capsule Take 60 mg by mouth daily.    Historical Provider, MD  esomeprazole (NEXIUM) 40 MG capsule Take 40 mg by mouth every morning.    Historical Provider, MD  gabapentin (NEURONTIN) 300 MG capsule Take 300 mg by mouth 3 (three) times daily.    Historical Provider, MD  HYDROcodone-acetaminophen (NORCO/VICODIN) 5-325 MG tablet Take 1 tablet by mouth every 4 (four) hours as needed. Take with food 03/30/16   Lily Kocher, PA-C  insulin aspart protamine- aspart (NOVOLOG MIX 70/30) (70-30) 100  UNIT/ML injection Inject 1-25 Units into the skin 2 (two) times daily. Per sliding scale-patient sliding scale    Historical Provider, MD  insulin glargine (LANTUS) 100 UNIT/ML injection Inject 80 Units into the skin 2 (two) times daily.     Historical Provider, MD  lisinopril-hydrochlorothiazide (PRINZIDE,ZESTORETIC) 20-12.5 MG per tablet Take 1 tablet by mouth 2 (two) times daily.     Historical Provider, MD  meloxicam (MOBIC) 15 MG tablet Take 15 mg by mouth daily.    Historical Provider, MD  metFORMIN (GLUCOPHAGE) 1000 MG tablet Take 1,000 mg by mouth 2 (two) times daily with a meal.    Historical Provider, MD  ondansetron (ZOFRAN) 8 MG tablet Take 1 tablet (8 mg  total) by mouth every 4 (four) hours as needed. 04/02/16   Nat Christen, MD  Potassium 99 MG TABS Take 1 tablet by mouth daily.    Historical Provider, MD  rOPINIRole (REQUIP) 1 MG tablet Take 1 mg by mouth at bedtime.     Historical Provider, MD  simvastatin (ZOCOR) 40 MG tablet Take 40 mg by mouth every evening.    Historical Provider, MD    ALLERGIES:  Allergies  Allergen Reactions  . Codeine Nausea And Vomiting    Tolerates the medication if taken with food    SOCIAL HISTORY:  Social History  Substance Use Topics  . Smoking status: Former Smoker    Packs/day: 1.50    Years: 25.00    Types: Cigarettes    Quit date: 10/23/2007  . Smokeless tobacco: Former Systems developer     Comment: Quit x 6 years ago  . Alcohol use No    FAMILY HISTORY: Family History  Problem Relation Age of Onset  . Adopted: Yes  . Family history unknown: Yes    EXAM: BP 185/93 (BP Location: Left Arm)   Pulse 65   Temp 97.8 F (36.6 C) (Oral)   Resp 18   Ht 5\' 7"  (1.702 m)   Wt 300 lb (136.1 kg)   SpO2 98%   BMI 46.99 kg/m  CONSTITUTIONAL: Alert and oriented and responds appropriately to questions. Obese, appears uncomfortable but is not on significant distress HEAD: Normocephalic EYES: Conjunctivae clear, PERRL ENT: normal nose; no rhinorrhea; moist mucous membranes NECK: Supple, no meningismus, no LAD  CARD: RRR; S1 and S2 appreciated; no murmurs, no clicks, no rubs, no gallops RESP: Normal chest excursion without splinting or tachypnea; breath sounds clear and equal bilaterally; no wheezes, no rhonchi, no rales, no hypoxia or respiratory distress, speaking full sentences ABD/GI: Normal bowel sounds; non-distended; soft, non-tender, no rebound, no guarding, no peritoneal signs BACK:  The back appears normal and is non-tender to palpation, there is no CVA tenderness EXT: Normal ROM in all joints; bilateral lower extremities are diffusely tender to palpation but otherwise x-rays are non-tender to palpation;  no edema; normal capillary refill; no cyanosis, no calf tenderness or swelling, 2+ radial and DP pulses bilaterally    SKIN: Normal color for age and race; warm; no rash NEURO: Moves all extremities equally, reports diminished sensation in the left arm diffusely but otherwise sensation to light touch intact diffusely, cranial nerves II through XII intact PSYCH: The patient's mood and manner are appropriate. Grooming and personal hygiene are appropriate.  MEDICAL DECISION MAKING: Patient here with chest pain. EKG shows new T-wave inversions in inferior and lateral leads with very minimal ST elevation in the anterior leads but does not meet STEMI criteria. Repeat EKG shows no significant change. We'll give  aspirin, nitroglycerin. Cardiac labs, chest x-ray pending. I feel she will need admission. Will discuss with cardiologist on call.  ED PROGRESS: 2:55 AM  D/w Dr. Mliss Sax cardiology fellow at [facility]. He has reviewed patient's EKG and agrees that she does not meet STEMI criteria. Her first troponin is negative. He does recommend starting heparin. She has no contraindications to heparin. She will get aspirin, nitroglycerin. Given her first troponin is -3 hours after the onset of symptoms, he feels the patient can be admitted to medicine service. He does however feel that she should be transferred to [facility] given multiple risk factors, EKG changes and a concerning story. I have updated patient with this plan and she agrees.   She had an oxygen saturation documented of 75%. I was in the room during this and it was a poor waveform and not real. Her sats are 98% on room air.   3:10 AM  Pt's labs show acute on chronic renal failure. She is not on dialysis. She still makes urine.  She states she is aware of her kidney function worsening and they have mentioned dialysis to her before. States she was supposed to have blood work that she "hasn't felt like it". She states she does not take  NSAIDs.  Patient's chest x-ray is clear. She does not appear volume overloaded. We'll discuss with medicine for admission to [facility]. Pain is improving with nitroglycerin. Will start nitroglycerin drip.  3:40 AM  D/w Dr. Olevia Bowens with hospitalist service who agrees on admission. He will place admission orders. Accepting physician at [facility] would be Dr. Myna Hidalgo.  5:15 AM  [facility] at bedside for transfer. She is pain-free on a nitroglycerin drip. Hemodynamically stable. Stable for transfer.   EKG Interpretation  Date/Time:  Sunday May 25 2016 02:19:37 EDT Ventricular Rate:  65 PR Interval:    QRS Duration: 77 QT Interval:  395 QTC Calculation: 411 R Axis:   45 Text Interpretation:  Sinus rhythm Abnormal T, consider ischemia, diffuse leads Minimal ST elevation, anterior leads T wave changes are new compared to prior Confirmed by Ladina Shutters,  DO, Gaila Engebretsen [phone]) on 05/25/2016 2:29:22 AM         EKG Interpretation  Date/Time:  Sunday May 25 2016 02:44:00 EDT Ventricular Rate:  70 PR Interval:    QRS Duration: 79 QT Interval:  387 QTC Calculation: 418 R Axis:   46 Text Interpretation:  Sinus rhythm Abnormal R-wave progression, early transition Abnormal T, consider ischemia, diffuse leads Minimal ST elevation, anterior leads Confirmed by Adonus Uselman,  DO, Jeily Guthridge ([id number]) on 05/25/2016 2:50:53 AM        CRITICAL CARE Performed by: Nyra Jabs   Total critical care time: 40 minutes  Critical care time was exclusive of separately billable procedures and treating other patients.  Critical care was necessary to treat or prevent imminent or life-threatening deterioration.  Critical care was time spent personally by me on the following activities: development of treatment plan with patient and/or surrogate as well as nursing, discussions with consultants, evaluation of patient's response to treatment, examination of patient, obtaining history from patient or surrogate,  ordering and performing treatments and interventions, ordering and review of laboratory studies, ordering and review of radiographic studies, pulse oximetry and re-evaluation of patient's condition.     [place], DO 05/25/16 [place], DO 05/25/16 [id number]

## 2016-05-25 NOTE — Progress Notes (Signed)
*  PRELIMINARY RESULTS* Echocardiogram 2D Echocardiogram has been performed.  Martha [name] 05/25/2016, 3:54 PM

## 2016-05-25 NOTE — H&P (Signed)
History and Physical    ROSHAWNDA PECORA JKD:[id number] DOB: Mar 30, 1966 DOA: 05/25/2016  PCP: Alonza Bogus, MD   Patient coming from: Home.  Chief Complaint: Chest pain.  HPI: LILYANAH CELESTIN is a 50 y.o. female with medical history significant of GERD, anemia, osteoarthritis, obesity, bipolar 1 disorder, depression, anxiety, cervical radiculopathy, COPD, type 2 diabetes, hypertension, obstructive sleep apnea on CPAP, restless syndrome who is coming to the emergency department with a history of chest pain that started about 2 days ago in the evening while she was in bed.  Per patient, her pain is pressure-like, mostly constant, exacerbated at times, radiated to her left arm worsened when she lies down or with exertion. The intensity has been as high as 10 out of 10. Associated symptoms are dyspnea, palpitations, mild nausea, diaphoresis and lower extremity edema. She denies any previous episodes of this type of pain before. She is a former smoker with a history of diabetes, obesity, hyperlipidemia and hypertension. She is adopted and does not know her biological family cardiac history.  ED Course: EKG showed ST segment and T-wave changes, troponin level was negative. However, after consulting with cardiology she was started on heparin and nitroglycerin infusions. Cardiology suggested to transfer the patient to [facility] for further evaluation and treatment.  Review of Systems: As per HPI otherwise 10 point review of systems negative.    Past Medical History:  Diagnosis Date  . Acid reflux   . Anemia   . Arthritis   . Bipolar 1 disorder ([place])   . Cervical radiculopathy   . COPD (chronic obstructive pulmonary disease) ([place]) 2014   bronchitis  . Degenerative disc disease, thoracic   . Depression   . Diabetes mellitus   . Headache   . Hypertension   . Noncompliance with medication regimen   . Obesity (BMI 30-39.9)   . OSA (obstructive sleep apnea)    cpap  . RLS (restless  legs syndrome)   . Shortness of breath     Past Surgical History:  Procedure Laterality Date  . BIOPSY N/A 05/17/2013   Procedure: BIOPSY;  Surgeon: [name], MD;  Location: [facility];  Service: Endoscopy;  Laterality: N/A;  . CHOLECYSTECTOMY N/A 04/27/2014   Procedure: LAPAROSCOPIC CHOLECYSTECTOMY;  Surgeon: [name], MD;  Location: [facility];  Service: General;  Laterality: N/A;  . COLONOSCOPY  06/06/2010   KDX:IPJASN bleeding secondary to internal hemorrhoids but incomplete evaluation secondary to poor right colon prep/small rectal and sigmoid colon polyps (hyperplastic). PROPOFOL  . COLONOSCOPY  May 2013   Dr. Gilliam/[facility]: 5 mm a descending colon polyp, hyperplastic. Adequate bowel prep.  . ENDOMETRIAL ABLATION    . ESOPHAGOGASTRODUODENOSCOPY  06/06/2010   KNL:ZJQBHAL erythema and edema of body of stomach, with sessile polypoid lesions. bx benign. no h.pylori  . ESOPHAGOGASTRODUODENOSCOPY (EGD) WITH PROPOFOL N/A 05/17/2013   Dr. Oneida Alar: normal esophagus, moderate nodular gastritis, negative path, empiric Savary dilation  . FLEXIBLE SIGMOIDOSCOPY N/A 05/17/2013   Dr. Oneida Alar: moderate sized internal hemorrhoids  . HEMORRHOID BANDING N/A 05/17/2013   Procedure: HEMORRHOID BANDING;  Surgeon: [name], MD;  Location: [facility];  Service: Endoscopy;  Laterality: N/A;  2 bands placed  . INCISION AND DRAINAGE ABSCESS Left 10/11/2013   Procedure: INCISION AND DRAINAGE AND DEBRIDEMENT LEFT BREAST  ABSCESS;  Surgeon: [name], MD;  Location: [facility];  Service: General;  Laterality: Left;  . IRRIGATION AND DEBRIDEMENT ABSCESS Right 06/01/2013   Procedure: INCISION AND DRAINAGE AND  DEBRIDEMENT ABSCESS RIGHT BREAST;  Surgeon: [name], MD;  Location: [facility];  Service: General;  Laterality: Right;  . SAVORY DILATION N/A 05/17/2013   Procedure: SAVORY DILATION;  Surgeon: [name], MD;  Location: [facility];  Service: Endoscopy;  Laterality: N/A;  14/15/16  . TUBAL  LIGATION       reports that she quit smoking about 8 years ago. Her smoking use included Cigarettes. She has a 37.50 pack-year smoking history. She has quit using smokeless tobacco. She reports that she does not drink alcohol or use drugs.  Allergies  Allergen Reactions  . Codeine Nausea And Vomiting    Tolerates the medication if taken with food    Family History  Problem Relation Age of Onset  . Adopted: Yes  . Family history unknown: Yes    Prior to Admission medications   Medication Sig Start Date End Date Taking? Authorizing Provider  albuterol (PROVENTIL) (2.5 MG/3ML) 0.083% nebulizer solution Take 2.5 mg by nebulization 4 (four) times daily.    Yes Historical Provider, MD  ALPRAZolam Duanne Moron) 1 MG tablet Take 1 mg by mouth 4 (four) times daily.     Yes Historical Provider, MD  amLODipine (NORVASC) 10 MG tablet Take 10 mg by mouth daily.   Yes Historical Provider, MD  esomeprazole (NEXIUM) 40 MG capsule Take 40 mg by mouth every morning.   Yes Historical Provider, MD  gabapentin (NEURONTIN) 300 MG capsule Take 300 mg by mouth 3 (three) times daily.   Yes Historical Provider, MD  HYDROcodone-acetaminophen (NORCO/VICODIN) 5-325 MG tablet Take 1 tablet by mouth every 4 (four) hours as needed. Take with food 03/30/16  Yes Lily Kocher, PA-C  aspirin-sod bicarb-citric acid (ALKA-SELTZER) 325 MG TBEF tablet Take 325 mg by mouth every 6 (six) hours as needed (cold).    Historical Provider, MD  diclofenac (VOLTAREN) 75 MG EC tablet Take 1 tablet (75 mg total) by mouth 2 (two) times daily with a meal. 10/17/14   Carole Civil, MD  dicyclomine (BENTYL) 10 MG capsule Take 1 capsule (10 mg total) by mouth 4 (four) times daily -  before meals and at bedtime. 02/09/14   Annitta Needs, NP  DULoxetine (CYMBALTA) 60 MG capsule Take 60 mg by mouth daily.    Historical Provider, MD  insulin aspart protamine- aspart (NOVOLOG MIX 70/30) (70-30) 100 UNIT/ML injection Inject 1-25 Units into the skin 2  (two) times daily. Per sliding scale-patient sliding scale    Historical Provider, MD  insulin glargine (LANTUS) 100 UNIT/ML injection Inject 80 Units into the skin 2 (two) times daily.     Historical Provider, MD  lisinopril-hydrochlorothiazide (PRINZIDE,ZESTORETIC) 20-12.5 MG per tablet Take 1 tablet by mouth 2 (two) times daily.     Historical Provider, MD  metFORMIN (GLUCOPHAGE) 1000 MG tablet Take 1,000 mg by mouth 2 (two) times daily with a meal.    Historical Provider, MD  Potassium 99 MG TABS Take 1 tablet by mouth daily.    Historical Provider, MD  rOPINIRole (REQUIP) 1 MG tablet Take 1 mg by mouth at bedtime.     Historical Provider, MD  simvastatin (ZOCOR) 40 MG tablet Take 40 mg by mouth every evening.    Historical Provider, MD    Physical Exam:  Constitutional: NAD, calm, comfortable Vitals:   05/25/16 0301 05/25/16 0310 05/25/16 0337 05/25/16 0400  BP: (!) 159/103  (!) 225/112 (!) 187/102  Pulse: 78 80 78 79  Resp: 16 19 18 12   Temp:  TempSrc:      SpO2: 98% 98% 99% 94%  Weight:      Height:       Eyes: PERRL, lids and conjunctivae normal ENMT: Mucous membranes are moist. Posterior pharynx clear of any exudate or lesions. Neck: normal, supple, no masses, no thyromegaly Respiratory: clear to auscultation bilaterally, no wheezing, no crackles. Normal respiratory effort. No accessory muscle use.  Cardiovascular: Regular rate and rhythm, no murmurs / rubs / gallops. No extremity edema. 2+ pedal pulses. No carotid bruits.  Abdomen: Obese, soft, no tenderness, no masses palpated. No hepatosplenomegaly. Bowel sounds positive.  Musculoskeletal: no clubbing / cyanosis. No joint deformity upper and lower extremities. Good ROM, no contractures. Normal muscle tone.  Skin: no rashes, lesions, ulcers on limited skin exam. Extensive body art. Neurologic: CN 2-12 grossly intact. Sensation intact, DTR normal. Strength 5/5 in all 4.  Psychiatric: Normal judgment and insight. Alert  and oriented x 4. Mildly anxious mood and teary while being interviewed and examined..    Labs on Admission: I have personally reviewed following labs and imaging studies  CBC:  Recent Labs Lab 05/25/16 0229  WBC 8.7  HGB 12.8  HCT 37.8  MCV 82.0  PLT 938   Basic Metabolic Panel:  Recent Labs Lab 05/25/16 0229  NA 137  K 4.2  CL 105  CO2 25  GLUCOSE 268*  BUN 48*  CREATININE 4.04*  CALCIUM 9.1   GFR: Estimated Creatinine Clearance: 24.6 mL/min (by C-G formula based on SCr of 4.04 mg/dL (H)). Liver Function Tests: No results for input(s): AST, ALT, ALKPHOS, BILITOT, PROT, ALBUMIN in the last 168 hours. No results for input(s): LIPASE, AMYLASE in the last 168 hours. No results for input(s): AMMONIA in the last 168 hours. Coagulation Profile: No results for input(s): INR, PROTIME in the last 168 hours. Cardiac Enzymes: No results for input(s): CKTOTAL, CKMB, CKMBINDEX, TROPONINI in the last 168 hours. BNP (last 3 results) No results for input(s): PROBNP in the last 8760 hours. HbA1C: No results for input(s): HGBA1C in the last 72 hours. CBG: No results for input(s): GLUCAP in the last 168 hours. Lipid Profile: No results for input(s): CHOL, HDL, LDLCALC, TRIG, CHOLHDL, LDLDIRECT in the last 72 hours. Thyroid Function Tests: No results for input(s): TSH, T4TOTAL, FREET4, T3FREE, THYROIDAB in the last 72 hours. Anemia Panel: No results for input(s): VITAMINB12, FOLATE, FERRITIN, TIBC, IRON, RETICCTPCT in the last 72 hours. Urine analysis:    Component Value Date/Time   COLORURINE YELLOW 04/24/2016 1700   APPEARANCEUR CLEAR 04/24/2016 1700   LABSPEC 1.020 04/24/2016 1700   PHURINE 6.0 04/24/2016 1700   GLUCOSEU >1000 (A) 04/24/2016 1700   HGBUR SMALL (A) 04/24/2016 1700   BILIRUBINUR NEGATIVE 04/24/2016 1700   KETONESUR NEGATIVE 04/24/2016 1700   PROTEINUR >300 (A) 04/24/2016 1700   UROBILINOGEN 0.2 03/17/2015 2236   NITRITE NEGATIVE 04/24/2016 1700    LEUKOCYTESUR NEGATIVE 04/24/2016 1700    Radiological Exams on Admission: Dg Chest 2 View  Result Date: 05/25/2016 CLINICAL DATA:  50 year old female with chest pain. EXAM: CHEST  2 VIEW COMPARISON:  Chest radiograph dated 04/02/2016 FINDINGS: The heart size and mediastinal contours are within normal limits. Both lungs are clear. The visualized skeletal structures are unremarkable. IMPRESSION: No active cardiopulmonary disease. Electronically Signed   By: Anner Crete M.D.   On: 05/25/2016 03:49  Echocardiogram 10/13/2013  ------------------------------------------------------------ LV EF: 60% -  65%  ------------------------------------------------------------ Indications:   CHF - 428.0.  ------------------------------------------------------------ History:  Risk factors: Diabetes  mellitus. Morbidly obese.  ------------------------------------------------------------ Study Conclusions  - Left ventricle: The cavity size was normal. Wall thickness was increased in a pattern of mild LVH. Systolic function was normal. The estimated ejection fraction was in the range of 60% to 65%. Wall motion was normal; there were no regional wall motion abnormalities. Features are consistent with a pseudonormal left ventricular filling pattern, with concomitant abnormal relaxation and increased filling pressure (grade 2 diastolic dysfunction). - Aortic valve: Not well visualized. Mildly calcified annulus. Probably trileaflet. No significant regurgitation. Mean gradient: 65mm Hg (S). Sclerotic aortic valve without stenosis. Peak velocity ratio of LVOT to aortic valve: 0.71. - Mitral valve: Calcified annulus. Mild regurgitation. - Left atrium: The atrium was mildly dilated. - Right atrium: Central venous pressure: 75mm Hg (est). - Atrial septum: No defect or patent foramen ovale was identified. - Tricuspid valve: Trivial regurgitation. - Pulmonary arteries:  Systolic pressure could not be accurately estimated. - Pericardium, extracardiac: There was no pericardial effusion. Impressions:  - Mild LVH with LVEF 10-25%, grade 2 diastolic dysfunction. Mild left atrial enlargement. MAC with mild mitral regurgitation. Sclerotic aortic valve without stenosis. Trivial tricuspid regurgitation, unable to assess PASP.  EKG: Independently reviewed. Vent. rate 65 BPM PR interval * ms QRS duration 77 ms QT/QTc 395/411 ms P-R-T axes 30 45 236 Sinus rhythm Abnormal T, consider ischemia, diffuse leads Minimal ST elevation, anterior leads New T wave changes when compared to previous tracings.  Assessment/Plan Principal Problem:   Chest pain   Unstable angina ([place]) Admit to [facility]/SDU/observation. Continue nitroglycerin and heparin infusions. Analgesics as needed. Trend troponin level. Check echocardiogram in the morning. Cardiology would like to be notified in a.m.  Active Problems:   Acute on chronic renal failure (HCC) Hold lisinopril and hydrochlorothiazide. Hold diclofenac. Check renal ultrasound and urine electrolytes. Consult nephrology in a.m.    Hyperlipidemia Continue simvastatin and monitor LFTs.    Depression Continue duloxetine 60 mg by mouth daily. On the alprazolam as needed for anxiety/insomnia.    Essential hypertension Hold lisinopril and HCTZ due to worsening renal function. Monitor blood pressure.Marland Kitchen    GERD Protonix 40 mg po daily.    Sleep apnea Continue CPAP at bedtime.    Diabetes mellitus, type 2 (HCC) Carbohydrate modified diet. Hold metformin due to acidemia.   Continue Lantus 80 units SQ twice a day. CBG monitoring with regular insulin sliding scale.       DVT prophylaxis: Lovenox SQ. Code Status: Full code. Family Communication: Her significant other was present in the room. Disposition Plan: Continue Heparin and NTG infusions.Transfer to [facility] SDU for higher level of care  and cardiology consult. Consults called: Cardiology (Dr. Mliss Sax asked Dr. Leonides Schanz to transfer to [facility] SDU) Admission status: SDU/observation.   Reubin Milan MD [facility] Pager [phone].  If 7PM-7AM, please contact night-coverage [email or web address] Password [email or web address]  05/25/2016, 4:40 AM

## 2016-05-25 NOTE — ED Notes (Signed)
Monitor values are incorrect as monitor is not accurate and has been reset

## 2016-05-25 NOTE — Progress Notes (Signed)
[place] TEAM 1 - Stepdown/ICU TEAM  Martha [name]  ZOX:[id number] DOB: 09/15/1965 DOA: 05/25/2016 PCP: Alonza Bogus, MD    Brief Narrative:  50 y.o. F with history of GERD, anemia, osteoarthritis, obesity, bipolar disorder, cervical radiculopathy, COPD, DM2, HTN, OSA on CPAP, and RLS who presented w/ c/o constant pressure like chest pain for 48hrs.  She is adopted and does not know her biological family cardiac history.  Subjective: Pt is seen for a f/u visit.    Assessment & Plan:  Unstable angina  nitroglycerin and heparin infusions cont until seen by Cards  Uncontrolled HTN adjust medical tx and follow  Acute on chronic renal failure Hold lisinopril, HCTZ, and diclofenac - renal US - baseline crt appers to be 1.9-2.5 but crt has been on steady rise for 2017  Recent Labs Lab 05/25/16 0229  CREATININE 4.04*    Uncontrolled DM2  Hyperlipidemia Continue simvastatin   Depression Continue duloxetine   GERD Protonix daily  Sleep apnea Continue CPAP at bedtime  DVT prophylaxis: heparin gtt Code Status: FULL CODE Family Communication: no family present at time of exam  Disposition Plan:   Consultants:  [facility]   Procedures: TTE - pending   Antimicrobials:  none   Objective: Blood pressure (!) 157/95, pulse 69, temperature 97.4 F (36.3 C), temperature source Oral, resp. rate (!) 21, height 5\' 7"  (1.702 m), weight 135.9 kg (299 lb 11.2 oz), SpO2 97 %.  Intake/Output Summary (Last 24 hours) at 05/25/16 0824 Last data filed at 05/25/16 0700  Gross per 24 hour  Intake            40.28 ml  Output              300 ml  Net          -259.72 ml   Filed Weights   05/25/16 0216 05/25/16 0625  Weight: 136.1 kg (300 lb) 135.9 kg (299 lb 11.2 oz)    Examination: Pt was seen for a f/u visit.    CBC:  Recent Labs Lab 05/25/16 0229  WBC 8.7  HGB 12.8  HCT 37.8  MCV 82.0  PLT 811   Basic Metabolic Panel:  Recent Labs Lab 05/25/16 0229    NA 137  K 4.2  CL 105  CO2 25  GLUCOSE 268*  BUN 48*  CREATININE 4.04*  CALCIUM 9.1   GFR: Estimated Creatinine Clearance: 24.5 mL/min (by C-G formula based on SCr of 4.04 mg/dL (H)).  Liver Function Tests: No results for input(s): AST, ALT, ALKPHOS, BILITOT, PROT, ALBUMIN in the last 168 hours. No results for input(s): LIPASE, AMYLASE in the last 168 hours. No results for input(s): AMMONIA in the last 168 hours.  Coagulation Profile: No results for input(s): INR, PROTIME in the last 168 hours.  Cardiac Enzymes: No results for input(s): CKTOTAL, CKMB, CKMBINDEX, TROPONINI in the last 168 hours.  HbA1C: Hgb A1c MFr Bld  Date/Time Value Ref Range Status  04/26/2014 12:07 PM 11.7 (H) <5.7 % Final    Comment:    (NOTE)                                                                       According to the ADA Clinical  Practice Recommendations for 2011, when HbA1c is used as a screening test:  >=6.5%   Diagnostic of Diabetes Mellitus           (if abnormal result is confirmed) 5.7-6.4%   Increased risk of developing Diabetes Mellitus References:Diagnosis and Classification of Diabetes Mellitus,Diabetes Care,2011,34(Suppl 1):S62-S69 and Standards of Medical Care in         Diabetes - 2011,Diabetes XOVA,9191,66 (Suppl 1):S11-S61.  05/30/2013 04:55 AM 11.7 (H) <5.7 % Final    Comment:    (NOTE)                                                                       According to the ADA Clinical Practice Recommendations for 2011, when HbA1c is used as a screening test:  >=6.5%   Diagnostic of Diabetes Mellitus           (if abnormal result is confirmed) 5.7-6.4%   Increased risk of developing Diabetes Mellitus References:Diagnosis and Classification of Diabetes Mellitus,Diabetes MAYO,4599,77(SFSEL 1):S62-S69 and Standards of Medical Care in         Diabetes - 2011,Diabetes Care,2011,34 (Suppl 1):S11-S61.    CBG:  Recent Labs Lab 05/25/16 0802  GLUCAP 248*    No  results found for this or any previous visit (from the past 240 hour(s)).   Scheduled Meds: . albuterol  2.5 mg Nebulization QID  . ALPRAZolam  1 mg Oral QID  . amLODipine  10 mg Oral Daily  . diclofenac  75 mg Oral BID WC  . dicyclomine  10 mg Oral TID AC & HS  . DULoxetine  60 mg Oral Daily  . gabapentin  300 mg Oral TID  . hydrochlorothiazide  12.5 mg Oral BID  . insulin aspart  0-20 Units Subcutaneous TID WC  . insulin aspart protamine- aspart  1-25 Units Subcutaneous BID  . insulin glargine  80 Units Subcutaneous BID  . lisinopril  20 mg Oral BID  . pantoprazole  40 mg Oral Daily  . rOPINIRole  1 mg Oral QHS  . simvastatin  40 mg Oral QPM  . sodium chloride flush  3 mL Intravenous Q12H   Continuous Infusions: . sodium chloride 100 mL/hr (05/25/16 0738)  . heparin 1,350 Units/hr (05/25/16 0737)  . nitroGLYCERIN 10 mcg/min (05/25/16 0736)     LOS: 0 days   Time spent: No Charge  Cherene Altes, MD [facility] Office  [phone] Pager - Text Page per Amion as per below:  On-Call/Text Page:      [email or web address]      password [email or web address]  If 7PM-7AM, please contact night-coverage [email or web address] Password [email or web address] 05/25/2016, 8:24 AM

## 2016-05-25 NOTE — Progress Notes (Signed)
Pt set up on CPAP per order.  Pt s/u on auto cycle with 10 cmH20 pressure.  Pt tolerating well.  RT will continue to monitor.

## 2016-05-25 NOTE — Progress Notes (Signed)
ANTICOAGULATION CONSULT NOTE - Follow Up Consult  Pharmacy Consult for heparin Indication: chest pain/ACS  Allergies  Allergen Reactions  . Codeine Nausea And Vomiting    Tolerates the medication if taken with food    Patient Measurements: Height: 5\' 7"  (170.2 cm) Weight: 299 lb 11.2 oz (135.9 kg) IBW/kg (Calculated) : 61.6 Heparin Dosing Weight: 94.7  Vital Signs: Temp: 97.8 F (36.6 C) (10/29 1232) Temp Source: Axillary (10/29 1232) BP: 162/84 (10/29 1232) Pulse Rate: 69 (10/29 1232)  Labs:  Recent Labs  05/25/16 0229 05/25/16 0834 05/25/16 1148  HGB 12.8  --   --   HCT 37.8  --   --   PLT 210  --   --   HEPARINUNFRC  --   --  0.13*  CREATININE 4.04*  --   --   TROPONINI  --  <0.03  --     Estimated Creatinine Clearance: 24.5 mL/min (by C-G formula based on SCr of 4.04 mg/dL (H)).   Assessment:  50 y/o morbidly obese female with CP. Pharmacy consulted to dose heparin. Initial heparin level is subtherapeutic. CBC stable. No issues noted.   Goal of Therapy:  Heparin level 0.3-0.7 units/ml Monitor platelets by anticoagulation protocol: Yes   Plan:  Heparin IV 2000u bolus, then increase heparin drip to 1600 units/hr 8hr heparin level Daily heparin level, CBC Monitor for S&S of bleed  Angela Burke, PharmD Pharmacy Resident Pager: [phone] 05/25/2016,1:27 PM

## 2016-05-25 NOTE — Progress Notes (Signed)
ANTICOAGULATION CONSULT NOTE - Follow Up Consult  Pharmacy Consult for Heparin  Indication: chest pain/ACS  Allergies  Allergen Reactions  . Codeine Nausea And Vomiting    Tolerates the medication if taken with food    Patient Measurements: Height: 5\' 7"  (170.2 cm) Weight: 299 lb 11.2 oz (135.9 kg) IBW/kg (Calculated) : 61.6  Vital Signs: Temp: 96.9 F (36.1 C) (10/29 1949) Temp Source: Axillary (10/29 1949) BP: 136/89 (10/29 2232) Pulse Rate: 61 (10/29 2259)  Labs:  Recent Labs  05/25/16 0229 05/25/16 0834 05/25/16 1148 05/25/16 1504 05/25/16 2154  HGB 12.8  --   --   --   --   HCT 37.8  --   --   --   --   PLT 210  --   --   --   --   HEPARINUNFRC  --   --  0.13*  --  0.59  CREATININE 4.04*  --   --  3.67*  --   TROPONINI  --  <0.03  --  <0.03  --     Estimated Creatinine Clearance: 27 mL/min (by C-G formula based on SCr of 3.67 mg/dL (H)).   Assessment: Heparin for CP, undergoing cardiac work-up, heparin level therapeutic x 1 after rate increase  Goal of Therapy:  Heparin level 0.3-0.7 units/ml Monitor platelets by anticoagulation protocol: Yes   Plan:  -Cont heparin at 1600 units/hr -Confirmatory HL with AM labs  Narda Bonds 05/25/2016,11:00 PM

## 2016-05-25 NOTE — ED Notes (Signed)
Paged Pharmacist on call to [facility] @ [phone]

## 2016-05-25 NOTE — ED Notes (Signed)
From radiology 

## 2016-05-25 NOTE — ED Notes (Signed)
Pt is conversant, moves all extremities ad lib, and has no deficits with movement or conversation

## 2016-05-25 NOTE — ED Notes (Signed)
Dr. Ward at bedside.

## 2016-05-25 NOTE — Telephone Encounter (Signed)
05/25/16 2:59 AM  Received call from Dr. Leonides Schanz. Patient is a 50 year old woman with obesity, htn, dm who presented with several hours of chest pain. ECG reviewed and does not meet STEMI criteria. Troponin is negative. Vitals are stable. She is being treated for unstable angina. Plan is for transfer to [facility] hospitalist service. If requested, we would be happy to see the patient in consultation when she arrives.  Soyla Murphy, MD Cardiology

## 2016-05-25 NOTE — ED Notes (Signed)
Pt is extremely anxious- conversant in complete sentences- She reports a chest pain and left side numbness for the last 2 days- she reports that she has not spoken to her physician , Dr Luan Pulling about this as she saw him two weeks ago- When asked if it was her current complaint, she replies that she was there for a recheck.

## 2016-05-25 NOTE — ED Notes (Signed)
Call from [employer], [place] who will write heparin orders

## 2016-05-26 DIAGNOSIS — Z794 Long term (current) use of insulin: Secondary | ICD-10-CM

## 2016-05-26 DIAGNOSIS — I519 Heart disease, unspecified: Secondary | ICD-10-CM

## 2016-05-26 DIAGNOSIS — N184 Chronic kidney disease, stage 4 (severe): Secondary | ICD-10-CM

## 2016-05-26 DIAGNOSIS — I10 Essential (primary) hypertension: Secondary | ICD-10-CM

## 2016-05-26 DIAGNOSIS — E7800 Pure hypercholesterolemia, unspecified: Secondary | ICD-10-CM

## 2016-05-26 DIAGNOSIS — N170 Acute kidney failure with tubular necrosis: Secondary | ICD-10-CM

## 2016-05-26 DIAGNOSIS — R079 Chest pain, unspecified: Secondary | ICD-10-CM

## 2016-05-26 DIAGNOSIS — G4733 Obstructive sleep apnea (adult) (pediatric): Secondary | ICD-10-CM

## 2016-05-26 DIAGNOSIS — E1165 Type 2 diabetes mellitus with hyperglycemia: Secondary | ICD-10-CM

## 2016-05-26 LAB — BASIC METABOLIC PANEL
Anion gap: 6 (ref 5–15)
BUN: 43 mg/dL — AB (ref 6–20)
CHLORIDE: 113 mmol/L — AB (ref 101–111)
CO2: 21 mmol/L — ABNORMAL LOW (ref 22–32)
Calcium: 8.7 mg/dL — ABNORMAL LOW (ref 8.9–10.3)
Creatinine, Ser: 3.9 mg/dL — ABNORMAL HIGH (ref 0.44–1.00)
GFR, EST AFRICAN AMERICAN: 15 mL/min — AB (ref >60)
GFR, EST NON AFRICAN AMERICAN: 13 mL/min — AB (ref >60)
Glucose, Bld: 220 mg/dL — ABNORMAL HIGH (ref 65–99)
POTASSIUM: 3.9 mmol/L (ref 3.5–5.1)
SODIUM: 140 mmol/L (ref 135–145)

## 2016-05-26 LAB — CBC
HCT: 32.7 % — ABNORMAL LOW (ref 36.0–46.0)
Hemoglobin: 10.6 g/dL — ABNORMAL LOW (ref 12.0–15.0)
MCH: 27.2 pg (ref 26.0–34.0)
MCHC: 32.4 g/dL (ref 30.0–36.0)
MCV: 83.8 fL (ref 78.0–100.0)
Platelets: 144 10*3/uL — ABNORMAL LOW (ref 150–400)
RBC: 3.9 MIL/uL (ref 3.87–5.11)
RDW: 16.1 % — ABNORMAL HIGH (ref 11.5–15.5)
WBC: 6.4 10*3/uL (ref 4.0–10.5)

## 2016-05-26 LAB — GLUCOSE, CAPILLARY
Glucose-Capillary: 139 mg/dL — ABNORMAL HIGH (ref 65–99)
Glucose-Capillary: 159 mg/dL — ABNORMAL HIGH (ref 65–99)

## 2016-05-26 LAB — HEPARIN LEVEL (UNFRACTIONATED): Heparin Unfractionated: 0.66 IU/mL (ref 0.30–0.70)

## 2016-05-26 MED ORDER — ASPIRIN 325 MG PO TABS: 325.0000 mg | ORAL_TABLET | Freq: Every day | ORAL | Status: DC

## 2016-05-26 MED ORDER — FUROSEMIDE 20 MG PO TABS: 20.0000 mg | ORAL_TABLET | Freq: Every day | ORAL | Status: DC

## 2016-05-26 MED ORDER — HYDRALAZINE HCL 10 MG PO TABS: 10.0000 mg | ORAL_TABLET | Freq: Three times a day (TID) | ORAL | Status: DC

## 2016-05-26 MED ORDER — ATORVASTATIN CALCIUM 20 MG PO TABS: 20.0000 mg | ORAL_TABLET | Freq: Every day | ORAL | 0 refills | Status: DC

## 2016-05-26 MED ORDER — HEPARIN (PORCINE) IN NACL 100-0.45 UNIT/ML-% IJ SOLN
1550.0000 [IU]/h | INTRAMUSCULAR | Status: DC
  Filled 2016-05-26: qty 250

## 2016-05-26 MED ORDER — CARVEDILOL 12.5 MG PO TABS: 12.5000 mg | ORAL_TABLET | Freq: Two times a day (BID) | ORAL | Status: DC

## 2016-05-26 MED ORDER — HYDRALAZINE HCL 10 MG PO TABS: 10.0000 mg | ORAL_TABLET | Freq: Three times a day (TID) | ORAL | 0 refills | Status: DC

## 2016-05-26 MED ORDER — FUROSEMIDE 20 MG PO TABS: 20.0000 mg | ORAL_TABLET | Freq: Every day | ORAL | 0 refills | Status: DC

## 2016-05-26 MED ORDER — CARVEDILOL 12.5 MG PO TABS: 12.5000 mg | ORAL_TABLET | Freq: Two times a day (BID) | ORAL | 0 refills | Status: DC

## 2016-05-26 NOTE — Progress Notes (Signed)
ANTICOAGULATION CONSULT NOTE - Follow Up Consult  Pharmacy Consult for Heparin  Indication: chest pain/ACS  Allergies  Allergen Reactions  . Codeine Nausea And Vomiting    Tolerates the medication if taken with food    Patient Measurements: Height: 5\' 7"  (170.2 cm) Weight: 299 lb 11.2 oz (135.9 kg) IBW/kg (Calculated) : 61.6  Vital Signs: BP: 131/78 (10/30 0700) Pulse Rate: 65 (10/30 0700)  Labs:  Recent Labs  05/25/16 0229 05/25/16 0834 05/25/16 1148 05/25/16 1504 05/25/16 2154 05/26/16 0202  HGB 12.8  --   --   --   --  10.6*  HCT 37.8  --   --   --   --  32.7*  PLT 210  --   --   --   --  144*  HEPARINUNFRC  --   --  0.13*  --  0.59 0.66  CREATININE 4.04*  --   --  3.67*  --  3.90*  TROPONINI  --  <0.03  --  <0.03  --   --     Estimated Creatinine Clearance: 25.4 mL/min (by C-G formula based on SCr of 3.9 mg/dL (H)).   Assessment: 50 yo female on IV heparin for r/o ACS.  Enzymes negative.  Heparin level remains at goal this morning, although slight trend up.  No bleeding or complications noted, CBC stable.  Goal of Therapy:  Heparin level 0.3-0.7 units/ml Monitor platelets by anticoagulation protocol: Yes   Plan:  -Will decrease heparin slightly to 1550 units/hr to keep in therapeutic range. -Continue daily heparin level and CBC. -F/u plans for further cardiac workup.  Uvaldo Rising, BCPS  Clinical Pharmacist Pager [phone]  05/26/2016 8:39 AM

## 2016-05-26 NOTE — Discharge Summary (Signed)
DISCHARGE SUMMARY  Martha [name]  MR#: [id number]  DOB:[birth year]  Date of Admission: 05/25/2016 Date of Discharge: 05/26/2016  Attending Physician:MCCLUNG,JEFFREY T  Patient's WNU:UVOZDGU,YQIHKV L, MD  Consults: [facility]   Disposition: D/C home   Follow-up Appts: Follow-up Information    HAWKINS,EDWARD L, MD Follow up in 4 day(s).   Specialty:  Pulmonary Disease Contact information: [address] [place] [address] [phone]        Minus Breeding, MD .   Specialty:  Cardiology Why:  The office will contact you to set up an appointment to arrange for a stress test.   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  She is adopted and does not know her biological family cardiac history.  Hospital Course:  Chest pain  Serial troponin without significant elevation - pain resolved - echocardiogram without focal wall motion abnormality - Cardiology evaluated as inpatient and will arrange for outpatient follow-up to accomplish stress testing  Uncontrolled HTN Counseled patient on absolute need to control blood pressure in the directly to her current diastolic heart failure as well as her renal failure - meds adjusted/change this admission - close follow-up will be indicated with probable need for further  medication titration - ACE inhibitor discontinued in setting of progressive renal failure  Newly diagnosed grade 2 diastolic congestive heart failure Well compensated presently - due to severely uncontrolled hypertension - counseled patient on absolute need for strict blood pressure control  Acute on chronic renal failure ACE inhibitor and NSAID discontinued - renal US without acute findings - baseline crt appers to be 1.9-2.5 but crt has been on steady rise for 2017 - counseled patient this will need to be followed very closely and that progression will lead to hemodialysis dependent  Uncontrolled DM2 CBG improved at time of discharge with last CBG 139 - patient counseled on need for very strict CBG control to prevent worsening of renal failure and other diabetic competitions  Hyperlipidemia Continue simvastatin   Depression Continue duloxetine   GERD Protonix daily  Sleep apnea Continue CPAPat bedtime    Medication List    STOP taking these medications   diclofenac 75 MG EC tablet Commonly known as:  VOLTAREN   doxycycline 100 MG capsule Commonly known as:  VIBRAMYCIN   insulin aspart protamine- aspart (70-30) 100 UNIT/ML injection Commonly known as:  NOVOLOG MIX 70/30   lisinopril-hydrochlorothiazide 20-12.5 MG tablet Commonly known as:  PRINZIDE,ZESTORETIC   metFORMIN 1000 MG tablet Commonly known as:  GLUCOPHAGE   metoprolol succinate 100 MG 24 hr tablet Commonly known as:  TOPROL-XL   Potassium 99 MG Tabs   simvastatin 40 MG tablet Commonly known as:  ZOCOR     TAKE these medications   albuterol (2.5 MG/3ML) 0.083% nebulizer solution Commonly known as:  PROVENTIL Take 2.5 mg by nebulization 4 (four) times daily.   ALPRAZolam 1 MG tablet Commonly known as:  XANAX Take 1 mg by mouth 4 (four) times daily.   amLODipine 10 MG tablet Commonly known as:  NORVASC Take 10 mg by mouth at bedtime.   aspirin 325 MG tablet Take 1 tablet (325 mg total) by  mouth daily. Start taking on:  05/27/2016   atorvastatin 20 MG tablet Commonly known as:  LIPITOR Take 1 tablet (20 mg total) by mouth daily at 6 PM.   baclofen 20 MG tablet Commonly known as:  LIORESAL Take 20 mg by mouth 3 (three) times daily as needed for muscle spasms.   carvedilol 12.5 MG tablet Commonly known as:  COREG Take 1 tablet (12.5 mg total) by mouth 2 (two) times daily with a meal.   dicyclomine 10 MG capsule Commonly known as:  BENTYL Take 1 capsule (10 mg total) by mouth 4 (four) times daily -  before meals and at bedtime.   DULoxetine 60 MG capsule Commonly known as:  CYMBALTA Take 60 mg by mouth at bedtime.   esomeprazole 40 MG capsule Commonly known as:  NEXIUM Take 40 mg by mouth at bedtime.   furosemide 20 MG tablet Commonly known as:  LASIX Take 1 tablet (20 mg total) by mouth daily. Start taking on:  05/27/2016   gabapentin 300 MG capsule Commonly known as:  NEURONTIN Take 300 mg by mouth 3 (three) times daily.   hydrALAZINE 10 MG tablet Commonly known as:  APRESOLINE Take 1 tablet (10 mg total) by mouth every 8 (eight) hours.   HYDROcodone-acetaminophen 5-325 MG tablet Commonly known as:  NORCO/VICODIN Take 1 tablet by mouth every 4 (four) hours as needed. Take with food What changed:  reasons to take this  additional instructions   insulin glargine 100 unit/mL Sopn Commonly known as:  LANTUS Inject 80 Units into the skin 2 (two) times daily.   PRESCRIPTION MEDICATION Inhale into the lungs at bedtime. CPAP   rOPINIRole 1 MG tablet Commonly known as:  REQUIP Take 1 mg by mouth at bedtime.       Day of Discharge BP 131/78 (BP Location: Right Arm)   Pulse 65   Temp (!) 96.9 F (36.1 C) (Axillary)   Resp 14   Ht 5\' 7"  (1.702 m)   Wt 135.9 kg (299 lb 11.2 oz)   SpO2 100%   BMI 46.94 kg/m   Physical Exam: General: No acute respiratory distress Lungs: Clear to auscultation bilaterally without wheezes or  crackles Cardiovascular: Regular rate and rhythm without murmur gallop or rub normal S1 and S2 Abdomen: Nontender, nondistended, soft, bowel sounds positive, no rebound, no ascites, no appreciable mass Extremities: No significant cyanosis, clubbing, or edema bilateral lower extremities  Basic Metabolic Panel:  Recent Labs Lab 05/25/16 0229 05/25/16 1504 05/26/16 0202  NA 137 137 140  K 4.2 3.9 3.9  CL 105 108 113*  CO2 25 20* 21*  GLUCOSE 268* 253* 220*  BUN 48* 46* 43*  CREATININE 4.04* 3.67* 3.90*  CALCIUM 9.1 8.6* 8.7*    Liver Function Tests:  Recent Labs Lab 05/25/16 1504  AST 12*  ALT 13*  ALKPHOS 71  BILITOT 0.3  PROT 5.1*  ALBUMIN 2.2*   CBC:  Recent Labs Lab 05/25/16 0229 05/26/16 0202  WBC 8.7 6.4  HGB 12.8 10.6*  HCT 37.8 32.7*  MCV 82.0 83.8  PLT 210 144*    Cardiac Enzymes:  Recent Labs Lab 05/25/16 0834 05/25/16 1504  TROPONINI <0.03 <0.03   BNP (last 3 results)  Recent Labs  10/10/15  2035  BNP 126.0*    CBG:  Recent Labs Lab 05/25/16 0802 05/25/16 1231 05/25/16 1555 05/25/16 2157 05/26/16 0742  GLUCAP 248* 238* 251* 256* 139*    Time spent in discharge (includes decision making & examination of pt): > 30 minutes  05/26/2016, 10:16 AM   Cherene Altes, MD [facility] Office  [phone] Pager [phone]  On-Call/Text Page:      [email or web address]      password [facility]

## 2016-05-26 NOTE — Discharge Instructions (Signed)
Hypertension  Hypertension, commonly called high blood pressure, is when the force of blood pumping through your arteries is too strong. Your arteries are the blood vessels that carry blood from your heart throughout your body. A blood pressure reading consists of a higher number over a lower number, such as 110/72. The higher number (systolic) is the pressure inside your arteries when your heart pumps. The lower number (diastolic) is the pressure inside your arteries when your heart relaxes. Ideally you want your blood pressure below 120/80. Hypertension forces your heart to work harder to pump blood. Your arteries may become narrow or stiff. Having untreated or uncontrolled hypertension can cause heart attack, stroke, kidney disease, and other problems. RISK FACTORS Some risk factors for high blood pressure are controllable. Others are not.  Risk factors you cannot control include:   Race. You may be at higher risk if you are African American.  Age. Risk increases with age.  Gender. Men are at higher risk than women before age 39 years. After age 5, women are at higher risk than men. Risk factors you can control include:  Not getting enough exercise or physical activity.  Being overweight.  Getting too much fat, sugar, calories, or salt in your diet.  Drinking too much alcohol. SIGNS AND SYMPTOMS Hypertension does not usually cause signs or symptoms. Extremely high blood pressure (hypertensive crisis) may cause headache, anxiety, shortness of breath, and nosebleed. DIAGNOSIS To check if you have hypertension, your health care provider will measure your blood pressure while you are seated, with your arm held at the level of your heart. It should be measured at least twice using the same arm. Certain conditions can cause a difference in blood pressure between your right and left arms. A blood pressure reading that is higher than normal on one occasion does not mean that you need treatment.  If it is not clear whether you have high blood pressure, you may be asked to return on a different day to have your blood pressure checked again. Or, you may be asked to monitor your blood pressure at home for 1 or more weeks. TREATMENT Treating high blood pressure includes making lifestyle changes and possibly taking medicine. Living a healthy lifestyle can help lower high blood pressure. You may need to change some of your habits. Lifestyle changes may include:  Following the DASH diet. This diet is high in fruits, vegetables, and whole grains. It is low in salt, red meat, and added sugars.  Keep your sodium intake below 2,300 mg per day.  Getting at least 30-45 minutes of aerobic exercise at least 4 times per week.  Losing weight if necessary.  Not smoking.  Limiting alcoholic beverages.  Learning ways to reduce stress. Your health care provider may prescribe medicine if lifestyle changes are not enough to get your blood pressure under control, and if one of the following is true:  You are 107-37 years of age and your systolic blood pressure is above 140.  You are 42 years of age or older, and your systolic blood pressure is above 150.  Your diastolic blood pressure is above 90.  You have diabetes, and your systolic blood pressure is over 315 or your diastolic blood pressure is over 90.  You have kidney disease and your blood pressure is above 140/90.  You have heart disease and your blood pressure is above 140/90. Your personal target blood pressure may vary depending on your medical conditions, your age, and other factors. HOME CARE  INSTRUCTIONS  Have your blood pressure rechecked as directed by your health care provider.   Take medicines only as directed by your health care provider. Follow the directions carefully. Blood pressure medicines must be taken as prescribed. The medicine does not work as well when you skip doses. Skipping doses also puts you at risk for  problems.  Do not smoke.   Monitor your blood pressure at home as directed by your health care provider. SEEK MEDICAL CARE IF:   You think you are having a reaction to medicines taken.  You have recurrent headaches or feel dizzy.  You have swelling in your ankles.  You have trouble with your vision. SEEK IMMEDIATE MEDICAL CARE IF:  You develop a severe headache or confusion.  You have unusual weakness, numbness, or feel faint.  You have severe chest or abdominal pain.  You vomit repeatedly.  You have trouble breathing. MAKE SURE YOU:   Understand these instructions.  Will watch your condition.  Will get help right away if you are not doing well or get worse.   This information is not intended to replace advice given to you by your health care provider. Make sure you discuss any questions you have with your health care provider.   Document Released: 07/14/2005 Document Revised: 11/28/2014 Document Reviewed: 05/06/2013 Elsevier Interactive Patient Education 2016 [employer].  How to Avoid Diabetes Problems  You can do a lot to prevent or slow down diabetes problems. Following your diabetes plan and taking care of yourself can reduce your risk of serious or life-threatening complications. Below, you will find certain things you can do to prevent diabetes problems. MANAGE YOUR DIABETES Follow your health care provider's, nurse educator's, and dietitian's instructions for managing your diabetes. They will teach you the basics of diabetes care. They can help answer questions you may have. Learn about diabetes and make healthy choices regarding eating and physical activity. Monitor your blood glucose level regularly. Your health care provider will help you decide how often to check your blood glucose level depending on your treatment goals and how well you are meeting them.  DO NOT USE NICOTINE Nicotine and diabetes are a dangerous combination. Nicotine raises your risk for  diabetes problems. If you quit using nicotine, you will lower your risk for heart attack, stroke, nerve disease, and kidney disease. Your cholesterol and your blood pressure levels may improve. Your blood circulation will also improve. Do not use any tobacco products, including cigarettes, chewing tobacco, or electronic cigarettes. If you need help quitting, ask your health care provider. KEEP YOUR BLOOD PRESSURE UNDER CONTROL Your health care provider will determine your individualized target blood pressure based on your age, your medicines, how long you have had diabetes, and any other medical conditions you have. Blood pressure consists of two numbers. Generally, the goal is to keep your top number (systolic pressure) at or below 130, and your bottom number (diastolic pressure) at or below 80. Your health care provider may recommend a lower target blood pressure reading, if appropriate. Meal planning, medicines, and exercise can help you reach your target blood pressure. Make sure your health care provider checks your blood pressure at every visit. KEEP YOUR CHOLESTEROL UNDER CONTROL Normal cholesterol levels will help prevent heart disease and stroke. These are the biggest health problems for people with diabetes. Keeping cholesterol levels under control can also help with blood flow. Have your cholesterol level checked at least once a year. Your health care provider may prescribe a medicine  known as a statin. Statins lower your cholesterol. If you are not taking a statin, ask your health care provider if you should be. Meal planning, exercise, and medicines can help you reach your cholesterol targets.  SCHEDULE AND KEEP YOUR ANNUAL PHYSICAL EXAMS AND EYE EXAMS Your health care provider will tell you how often he or she wants to see you depending on your plan of treatment. It is important that you keep these appointments so that possible problems can be identified early and complications can be avoided or  treated.  Every visit with your health care provider should include your weight, blood pressure, and an evaluation of your blood glucose control.  Your hemoglobin A1c should be checked:  At least twice a year if you are at your goal.  Every 3 months if there are changes in treatment.  If you are not meeting your goals.  Your blood lipids should be checked yearly. You should also be checked yearly to see if you have protein in your urine (microalbumin).  Schedule a dilated eye exam within 5 years of your diagnosis if you have type 1 diabetes, and then yearly. Schedule a dilated eye exam at diagnosis if you have type 2 diabetes, and then yearly. All exams thereafter can be extended to every 2 to 3 years if one or more exams have been normal. KEEP YOUR VACCINES CURRENT It is recommended that you receive a flu (influenza) vaccine every year. It is also recommended that you receive a pneumonia (pneumococcal) vaccine. If you are 15 years of age or older and have never received a pneumonia vaccine, this vaccine may be given as a series of two separate shots. Ask your health care provider which additional vaccines may be recommended. TAKE CARE OF YOUR FEET  Diabetes may cause you to have a poor blood supply (circulation) to your legs and feet. Because of this, the skin may be thinner, break easier, and heal more slowly. You also may have nerve damage in your legs and feet, causing decreased feeling. You may not notice minor injuries to your feet that could lead to serious problems or infections. Taking care of your feet is very important. Visual foot exams are performed at every routine medical visit. The exams check for cuts, injuries, or other problems with the feet. A comprehensive foot exam should be done yearly. This includes visual inspection as well as assessing foot pulses and testing for loss of sensation. You should also do the following:  Inspect your feet daily for cuts, calluses, blisters,  ingrown toenails, and signs of infection, such as redness, swelling, or pus.  Wash and dry your feet thoroughly, especially between the toes.  Avoid soaking your feet regularly in hot water baths.  Moisturize dry skin with lotion, avoiding areas between your toes.  Cut toenails straight across and file the edges.  Avoid shoes that do not fit well or have areas that irritate your skin.  Avoid going barefooted or wearing only socks. Your feet need protection. TAKE CARE OF YOUR TEETH People with poorly controlled diabetes are more likely to have gum (periodontal) disease. These infections make diabetes harder to control. Periodontal diseases, if left untreated, can lead to tooth loss. Brush your teeth twice a day, floss, and see your dentist for checkups and cleaning every 6 months, or 2 times a year. ASK YOUR HEALTH CARE PROVIDER ABOUT TAKING ASPIRIN Taking aspirin daily is recommended to help prevent cardiovascular disease in people with and without diabetes. Ask your  health care provider if this would benefit you and what dose he or she would recommend. DRINK RESPONSIBLY Moderate amounts of alcohol (less than 1 drink per day for adult women and less than 2 drinks per day for adult men) have a minimal effect on blood glucose if ingested with food. It is important to eat food with alcohol to avoid hypoglycemia. People should avoid alcohol if they have a history of alcohol abuse or dependence, if they are pregnant, and if they have liver disease, pancreatitis, advanced neuropathy, or severe hypertriglyceridemia. LESSEN STRESS Living with diabetes can be stressful. When you are under stress, your blood glucose may be affected in two ways:  Stress hormones may cause your blood glucose to rise.  You may be distracted from taking good care of yourself. It is a good idea to be aware of your stress level and make changes that are necessary to help you better manage challenging situations. Support  groups, planned relaxation, a hobby you enjoy, meditation, healthy relationships, and exercise all work to lower your stress level. If your efforts do not seem to be helping, get help from your health care provider or a trained mental health professional.   This information is not intended to replace advice given to you by your health care provider. Make sure you discuss any questions you have with your health care provider.   Document Released: 04/01/2011 Document Revised: 08/04/2014 Document Reviewed: 09/07/2013 Elsevier Interactive Patient Education [employer].

## 2016-05-26 NOTE — Progress Notes (Signed)
Patient Name: Martha [name] Date of Encounter: 05/26/2016  Primary Cardiologist: new [facility])  Hospital Problem List     Principal Problem:   Chest pain Active Problems:   Hyperlipidemia   Depression   Essential hypertension   GERD   Sleep apnea   Diabetes mellitus, type 2 (HCC)   Unstable angina (HCC)   Acute on chronic renal failure (HCC)     Subjective   Feeling well.  She denies recurrent chest pain or shortness of breath.  Inpatient Medications    Scheduled Meds: . albuterol  2.5 mg Nebulization QID  . ALPRAZolam  1 mg Oral QID  . amLODipine  10 mg Oral Daily  . aspirin  325 mg Oral Daily  . atorvastatin  20 mg Oral q1800  . dicyclomine  10 mg Oral TID AC & HS  . DULoxetine  60 mg Oral Daily  . gabapentin  100 mg Oral TID  . insulin aspart  0-20 Units Subcutaneous TID WC  . insulin aspart  0-5 Units Subcutaneous QHS  . insulin glargine  80 Units Subcutaneous BID  . metoprolol tartrate  50 mg Oral BID  . pantoprazole  40 mg Oral Daily  . rOPINIRole  1 mg Oral QHS  . sodium chloride flush  3 mL Intravenous Q12H   Continuous Infusions: . sodium chloride 75 mL/hr at 05/26/16 0400  . heparin Stopped (05/26/16 0845)  . nitroGLYCERIN Stopped (05/25/16 1600)   PRN Meds: acetaminophen, HYDROcodone-acetaminophen, ondansetron **OR** ondansetron (ZOFRAN) IV   Vital Signs    Vitals:   05/25/16 2259 05/26/16 0000 05/26/16 0700 05/26/16 0758  BP:  114/69 131/78   Pulse: 61 67 65   Resp: (!) 23 12 14    Temp:      TempSrc:      SpO2: 100% 100% 100% 100%  Weight:      Height:        Intake/Output Summary (Last 24 hours) at 05/26/16 0956 Last data filed at 05/26/16 0400  Gross per 24 hour  Intake          1484.04 ml  Output              750 ml  Net           734.04 ml   Filed Weights   05/25/16 0216 05/25/16 0625  Weight: 136.1 kg (300 lb) 135.9 kg (299 lb 11.2 oz)    Physical Exam    GEN: Well nourished, well developed, in no acute distress.   Morbidly obest HEENT: Grossly normal.  Neck: Supple, no JVD, carotid bruits, or masses. Cardiac: RRR, no murmurs, rubs, or gallops. No clubbing, cyanosis, edema.  Radials/DP/PT 2+ and equal bilaterally.  Respiratory:  Respirations regular and unlabored, clear to auscultation bilaterally. GI: Soft, nontender, nondistended, BS + x 4. MS: no deformity or atrophy. Skin: warm and dry, no rash. Neuro:  Strength and sensation are intact. Psych: AAOx3.  Normal affect.  Labs    CBC  Recent Labs  05/25/16 0229 05/26/16 0202  WBC 8.7 6.4  HGB 12.8 10.6*  HCT 37.8 32.7*  MCV 82.0 83.8  PLT 210 809*   Basic Metabolic Panel  Recent Labs  05/25/16 1504 05/26/16 0202  NA 137 140  K 3.9 3.9  CL 108 113*  CO2 20* 21*  GLUCOSE 253* 220*  BUN 46* 43*  CREATININE 3.67* 3.90*  CALCIUM 8.6* 8.7*   Liver Function Tests  Recent Labs  05/25/16 1504  AST 12*  ALT 13*  ALKPHOS 71  BILITOT 0.3  PROT 5.1*  ALBUMIN 2.2*   No results for input(s): LIPASE, AMYLASE in the last 72 hours. Cardiac Enzymes  Recent Labs  05/25/16 0834 05/25/16 1504  TROPONINI <0.03 <0.03   BNP Invalid input(s): POCBNP D-Dimer No results for input(s): DDIMER in the last 72 hours. Hemoglobin A1C No results for input(s): HGBA1C in the last 72 hours. Fasting Lipid Panel No results for input(s): CHOL, HDL, LDLCALC, TRIG, CHOLHDL, LDLDIRECT in the last 72 hours. Thyroid Function Tests No results for input(s): TSH, T4TOTAL, T3FREE, THYROIDAB in the last 72 hours.  Invalid input(s): FREET3  Telemetry    Sinus bradycardia - Personally Reviewed  ECG    Sinus rhythm. Rate 70 bpm. Early R wave progression. Inferolateral T wave inversions.- Personally Reviewed  Radiology    Dg Chest 2 View  Result Date: 05/25/2016 CLINICAL DATA:  50 year old female with chest pain. EXAM: CHEST  2 VIEW COMPARISON:  Chest radiograph dated 04/02/2016 FINDINGS: The heart size and mediastinal contours are within normal  limits. Both lungs are clear. The visualized skeletal structures are unremarkable. IMPRESSION: No active cardiopulmonary disease. Electronically Signed   By: Anner Crete M.D.   On: 05/25/2016 03:49   US Renal  Result Date: 05/25/2016 CLINICAL DATA:  Acute renal injury EXAM: RENAL / URINARY TRACT ULTRASOUND COMPLETE COMPARISON:  CT scan September 04, 2015 FINDINGS: Right Kidney: Length: 11.3 cm. 1 cm echogenic non shadowing region in the right kidney may be artifactual. A stone is considered less likely due to the lack of shadowing. No hydronephrosis. Left Kidney: Length: 11.7 cm.  1.6 cm cyst. Bladder: Appears normal for degree of bladder distention. The spleen is enlarged with a volume of 608 cc and a maximum dimension of 13.3 cm. IMPRESSION: 1. No cause for acute renal failure identified. Non shadowing hyper echogenicity in the upper right kidney may be artifactual versus a non shadowing stone. No hydronephrosis. Left renal cysts. 2. Splenomegaly. Electronically Signed   By: Dorise Bullion III M.D   On: 05/25/2016 18:49    Cardiac Studies   Echo 05/25/16: Study Conclusions  - Left ventricle: The cavity size was normal. There was moderate   concentric hypertrophy. Systolic function was normal. The   estimated ejection fraction was in the range of 60% to 65%. Wall   motion was normal; there were no regional wall motion   abnormalities. Features are consistent with a pseudonormal left   ventricular filling pattern, with concomitant abnormal relaxation   and increased filling pressure (grade 2 diastolic dysfunction).   Patient Profile     [name] isa 26O with Diabetes, hypertension, obstructive sleep apnea on CPAP, morbid obesity, and bipolar disorder here with chest pain and acute on chronic renal failure.  Assessment & Plan    # Chest pain: Symptoms have resolved. Cardiac enzymes were negative and her echo did not reveal any wall motion abnormalities. She had normal systolic  function.  We'll plan for outpatient evaluation and possibly stress testing.  # Hypertensive heart disease:  Blood pressure has been labile. Of note, her blood pressures are being taken on her forearm.  I suspect that the low blood pressures may be spurious and that her blood pressure actually is high. Immediately before her most recent blood pressure reading of 174/111, the machine read a blood pressure of 71 systolic. Her home hydrochlorothiazide and lisinopril have been held due to acute renal failure. Would recommend either switching from metoprolol to carvedilol or  adding hydralazine.  # Diastolic dysfunction:  Echo revealed grade 2 diastolic dysfunction. She does not have any evidence of heart failure on exam. Recommend blood pressure management as above.   We will sign off and arrange for outpatient follow-up.  Signed, Skeet Latch, MD  05/26/2016, 9:56 AM

## 2016-07-02 ENCOUNTER — Encounter (HOSPITAL_COMMUNITY): Payer: Self-pay

## 2016-07-02 ENCOUNTER — Emergency Department (HOSPITAL_COMMUNITY)
Admission: EM | Admit: 2016-07-02 | Discharge: 2016-07-02 | Disposition: A | Discharge status: Home / Self Care | Payer: Medicaid Other | Attending: Emergency Medicine | Admitting: Emergency Medicine

## 2016-07-02 ENCOUNTER — Emergency Department (HOSPITAL_COMMUNITY): Payer: Medicaid Other

## 2016-07-02 DIAGNOSIS — Z7984 Long term (current) use of oral hypoglycemic drugs: Secondary | ICD-10-CM | POA: Insufficient documentation

## 2016-07-02 DIAGNOSIS — Z87891 Personal history of nicotine dependence: Secondary | ICD-10-CM | POA: Insufficient documentation

## 2016-07-02 DIAGNOSIS — J449 Chronic obstructive pulmonary disease, unspecified: Secondary | ICD-10-CM | POA: Diagnosis not present

## 2016-07-02 DIAGNOSIS — R002 Palpitations: Secondary | ICD-10-CM | POA: Diagnosis not present

## 2016-07-02 DIAGNOSIS — Z79899 Other long term (current) drug therapy: Secondary | ICD-10-CM | POA: Diagnosis not present

## 2016-07-02 DIAGNOSIS — E119 Type 2 diabetes mellitus without complications: Secondary | ICD-10-CM | POA: Diagnosis not present

## 2016-07-02 DIAGNOSIS — I10 Essential (primary) hypertension: Secondary | ICD-10-CM | POA: Diagnosis not present

## 2016-07-02 LAB — CBC
HCT: 35.2 % — ABNORMAL LOW (ref 36.0–46.0)
Hemoglobin: 11.6 g/dL — ABNORMAL LOW (ref 12.0–15.0)
MCH: 28 pg (ref 26.0–34.0)
MCHC: 33 g/dL (ref 30.0–36.0)
MCV: 85 fL (ref 78.0–100.0)
PLATELETS: 195 10*3/uL (ref 150–400)
RBC: 4.14 MIL/uL (ref 3.87–5.11)
RDW: 15.1 % (ref 11.5–15.5)
WBC: 7.8 10*3/uL (ref 4.0–10.5)

## 2016-07-02 LAB — TROPONIN I

## 2016-07-02 LAB — COMPREHENSIVE METABOLIC PANEL
ALBUMIN: 3.1 g/dL — AB (ref 3.5–5.0)
ALT: 18 U/L (ref 14–54)
AST: 10 U/L — AB (ref 15–41)
Alkaline Phosphatase: 77 U/L (ref 38–126)
Anion gap: 5 (ref 5–15)
BUN: 60 mg/dL — AB (ref 6–20)
CHLORIDE: 112 mmol/L — AB (ref 101–111)
CO2: 23 mmol/L (ref 22–32)
Calcium: 9.5 mg/dL (ref 8.9–10.3)
Creatinine, Ser: 4.25 mg/dL — ABNORMAL HIGH (ref 0.44–1.00)
GFR calc Af Amer: 13 mL/min — ABNORMAL LOW (ref >60)
GFR, EST NON AFRICAN AMERICAN: 11 mL/min — AB (ref >60)
GLUCOSE: 189 mg/dL — AB (ref 65–99)
POTASSIUM: 4.7 mmol/L (ref 3.5–5.1)
SODIUM: 140 mmol/L (ref 135–145)
Total Bilirubin: 0.4 mg/dL (ref 0.3–1.2)
Total Protein: 6.7 g/dL (ref 6.5–8.1)

## 2016-07-02 LAB — MAGNESIUM: MAGNESIUM: 2.1 mg/dL (ref 1.7–2.4)

## 2016-07-02 LAB — PROTIME-INR
INR: 1.1
Prothrombin Time: 14.2 seconds (ref 11.4–15.2)

## 2016-07-02 MED ORDER — SODIUM CHLORIDE 0.9 % IV BOLUS (SEPSIS)
1000.0000 mL | Freq: Once | INTRAVENOUS | Status: AC
  Administered 2016-07-02: 1000 mL via INTRAVENOUS

## 2016-07-02 MED ORDER — ASPIRIN 81 MG PO CHEW: 324.0000 mg | CHEWABLE_TABLET | Freq: Once | ORAL | Status: DC

## 2016-07-02 NOTE — ED Provider Notes (Signed)
[facility] DEPT Provider Note   CSN: [id number] Arrival date & time: 07/02/16  1742     History   Chief Complaint Chief Complaint  Patient presents with  . Chest Pain  . Palpitations    HPI Martha [name] is a 50 y.o. female.  HPI  Patient presents with lightheadedness, palpitations. Onset seems to have been yesterday, since onset she has had persistent symptoms, including lightheadedness, weakness, dyspnea. She adds that when she is lying supine she feels like her heart is stopping. No chest pain, no abdominal pain, no vomiting, no diarrhea, no fever, no chills, no cough. Patient has no recent medication changes, but was hospitalized about 6 weeks ago, and since that time has been on a new antihypertensive regimen.   Past Medical History:  Diagnosis Date  . Acid reflux   . Anemia   . Arthritis   . Bipolar 1 disorder ([place])   . Cervical radiculopathy   . COPD (chronic obstructive pulmonary disease) ([place]) 2014   bronchitis  . Degenerative disc disease, thoracic   . Depression   . Diabetes mellitus   . Hypertension   . Noncompliance with medication regimen   . Obesity (BMI 30-39.9)   . OSA (obstructive sleep apnea)    cpap  . RLS (restless legs syndrome)     Patient Active Problem List   Diagnosis Date Noted  . Acute on chronic renal failure ([place]) 05/25/2016  . Abdominal pain 04/26/2014  . Cholelithiasis 04/26/2014  . Acute renal failure ([place]) 04/26/2014  . Dehydration 04/26/2014  . Hyponatremia 04/26/2014  . Gallstones 04/06/2014  . Uncontrolled diabetes mellitus ([place]) 10/09/2013  . Breast abscess 05/30/2013  . Diabetes mellitus, type 2 ([place]) 05/30/2013  . Hyperglycemia 05/30/2013  . Internal hemorrhoids with other complication 81/82/9937  . Umbilical hernia without mention of obstruction or gangrene 04/15/2013  . Anemia 04/15/2013  . DM 05/13/2010  . Hyperlipidemia 05/13/2010  . OBESITY 05/13/2010  . Depression 05/13/2010  . RESTLESS LEG SYNDROME  05/13/2010  . Essential hypertension 05/13/2010  . GERD 05/13/2010  . RECTAL BLEEDING 05/13/2010  . Sleep apnea 05/13/2010  . Dysphagia, oropharyngeal phase 05/13/2010    Past Surgical History:  Procedure Laterality Date  . BIOPSY N/A 05/17/2013   Procedure: BIOPSY;  Surgeon: [name], MD;  Location: [facility];  Service: Endoscopy;  Laterality: N/A;  . CHOLECYSTECTOMY N/A 04/27/2014   Procedure: LAPAROSCOPIC CHOLECYSTECTOMY;  Surgeon: [name], MD;  Location: [facility];  Service: General;  Laterality: N/A;  . COLONOSCOPY  06/06/2010   JIR:CVELFY bleeding secondary to internal hemorrhoids but incomplete evaluation secondary to poor right colon prep/small rectal and sigmoid colon polyps (hyperplastic). PROPOFOL  . COLONOSCOPY  May 2013   Dr. Gilliam/[facility]: 5 mm a descending colon polyp, hyperplastic. Adequate bowel prep.  . ENDOMETRIAL ABLATION    . ESOPHAGOGASTRODUODENOSCOPY  06/06/2010   BOF:BPZWCHE erythema and edema of body of stomach, with sessile polypoid lesions. bx benign. no h.pylori  . ESOPHAGOGASTRODUODENOSCOPY (EGD) WITH PROPOFOL N/A 05/17/2013   Dr. Oneida Alar: normal esophagus, moderate nodular gastritis, negative path, empiric Savary dilation  . FLEXIBLE SIGMOIDOSCOPY N/A 05/17/2013   Dr. Oneida Alar: moderate sized internal hemorrhoids  . HEMORRHOID BANDING N/A 05/17/2013   Procedure: HEMORRHOID BANDING;  Surgeon: [name], MD;  Location: [facility];  Service: Endoscopy;  Laterality: N/A;  2 bands placed  . INCISION AND DRAINAGE ABSCESS Left 10/11/2013   Procedure: INCISION AND DRAINAGE AND DEBRIDEMENT LEFT BREAST  ABSCESS;  Surgeon: [name], MD;  Location: [facility];  Service: General;  Laterality: Left;  . IRRIGATION AND DEBRIDEMENT ABSCESS Right 06/01/2013   Procedure: INCISION AND DRAINAGE AND DEBRIDEMENT ABSCESS RIGHT BREAST;  Surgeon: [name], MD;  Location: [facility];  Service: General;  Laterality: Right;  . SAVORY DILATION N/A 05/17/2013    Procedure: SAVORY DILATION;  Surgeon: [name], MD;  Location: [facility];  Service: Endoscopy;  Laterality: N/A;  14/15/16  . TUBAL LIGATION      OB History    Gravida Para Term Preterm AB Living   2 2 1 1   2    SAB TAB Ectopic Multiple Live Births                   Home Medications    Prior to Admission medications   Medication Sig Start Date End Date Taking? Authorizing Provider  albuterol (PROVENTIL) (2.5 MG/3ML) 0.083% nebulizer solution Take 2.5 mg by nebulization 4 (four) times daily.     Historical Provider, MD  ALPRAZolam Duanne Moron) 1 MG tablet Take 1 mg by mouth 4 (four) times daily.      Historical Provider, MD  amLODipine (NORVASC) 10 MG tablet Take 10 mg by mouth at bedtime.     Historical Provider, MD  aspirin 325 MG tablet Take 1 tablet (325 mg total) by mouth daily. 05/27/16   Cherene Altes, MD  atorvastatin (LIPITOR) 20 MG tablet Take 1 tablet (20 mg total) by mouth daily at 6 PM. 05/26/16   Cherene Altes, MD  baclofen (LIORESAL) 20 MG tablet Take 20 mg by mouth 3 (three) times daily as needed for muscle spasms.    Historical Provider, MD  carvedilol (COREG) 12.5 MG tablet Take 1 tablet (12.5 mg total) by mouth 2 (two) times daily with a meal. 05/26/16   Cherene Altes, MD  dicyclomine (BENTYL) 10 MG capsule Take 1 capsule (10 mg total) by mouth 4 (four) times daily -  before meals and at bedtime. 02/09/14   Annitta Needs, NP  DULoxetine (CYMBALTA) 60 MG capsule Take 60 mg by mouth at bedtime.     Historical Provider, MD  esomeprazole (NEXIUM) 40 MG capsule Take 40 mg by mouth at bedtime.     Historical Provider, MD  furosemide (LASIX) 20 MG tablet Take 1 tablet (20 mg total) by mouth daily. 05/27/16   Cherene Altes, MD  gabapentin (NEURONTIN) 300 MG capsule Take 300 mg by mouth 3 (three) times daily.    Historical Provider, MD  hydrALAZINE (APRESOLINE) 10 MG tablet Take 1 tablet (10 mg total) by mouth every 8 (eight) hours. 05/26/16   Cherene Altes, MD    HYDROcodone-acetaminophen (NORCO/VICODIN) 5-325 MG tablet Take 1 tablet by mouth every 4 (four) hours as needed. Take with food Patient taking differently: Take 1 tablet by mouth every 4 (four) hours as needed (pain). Take with food 03/30/16   Lily Kocher, PA-C  insulin glargine (LANTUS) 100 unit/mL SOPN Inject 80 Units into the skin 2 (two) times daily.    Historical Provider, MD  PRESCRIPTION MEDICATION Inhale into the lungs at bedtime. CPAP    Historical Provider, MD  rOPINIRole (REQUIP) 1 MG tablet Take 1 mg by mouth at bedtime.     Historical Provider, MD    Family History Family History  Problem Relation Age of Onset  . Adopted: Yes  . Family history unknown: Yes    Social History Social History  Substance Use Topics  . Smoking status: Former  Smoker    Packs/day: 1.50    Years: 25.00    Types: Cigarettes    Quit date: 10/23/2007  . Smokeless tobacco: Former Systems developer     Comment: Quit x 6 years ago  . Alcohol use No     Allergies   Codeine   Review of Systems Review of Systems  Constitutional:       Per HPI, otherwise negative  HENT:       Per HPI, otherwise negative  Respiratory:       Per HPI, otherwise negative  Cardiovascular:       Per HPI, otherwise negative  Gastrointestinal: Negative for vomiting.  Endocrine:       Negative aside from HPI  Genitourinary:       Neg aside from HPI   Musculoskeletal:       Per HPI, otherwise negative  Skin: Negative.   Allergic/Immunologic: Negative for immunocompromised state.  Neurological: Positive for weakness. Negative for syncope.     Physical Exam Updated Vital Signs There were no vitals taken for this visit.  Physical Exam  Constitutional: She is oriented to person, place, and time. She appears well-developed and well-nourished.  Obese female sitting upright speaking clearly  HENT:  Head: Normocephalic and atraumatic.  Eyes: Conjunctivae and EOM are normal.  Cardiovascular: Normal rate and regular  rhythm.   Pulmonary/Chest: Effort normal and breath sounds normal. No stridor. No respiratory distress.  Abdominal: She exhibits no distension.  Musculoskeletal: She exhibits no edema.  Neurological: She is alert and oriented to person, place, and time. No cranial nerve deficit.  Skin: Skin is warm and dry.  Psychiatric: She has a normal mood and affect.  Nursing note and vitals reviewed.    ED Treatments / Results  Labs (all labs ordered are listed, but only abnormal results are displayed) Labs Reviewed  CBC - Abnormal; Notable for the following:       Result Value   Hemoglobin 11.6 (*)    HCT 35.2 (*)    All other components within normal limits  COMPREHENSIVE METABOLIC PANEL - Abnormal; Notable for the following:    Chloride 112 (*)    Glucose, Bld 189 (*)    BUN 60 (*)    Creatinine, Ser 4.25 (*)    Albumin 3.1 (*)    AST 10 (*)    GFR calc non Af Amer 11 (*)    GFR calc Af Amer 13 (*)    All other components within normal limits  MAGNESIUM  PROTIME-INR  TROPONIN I    EKG  EKG Interpretation  Date/Time:  Wednesday July 02 2016 17:58:05 EST Ventricular Rate:  72 PR Interval:    QRS Duration: 83 QT Interval:  379 QTC Calculation: 415 R Axis:   32 Text Interpretation:  Sinus rhythm Nonspecific T abnormalities, lateral leads Abnormal ekg Confirmed by Carmin Muskrat  MD [id number]) on 07/02/2016 6:03:35 PM       Radiology Dg Chest 2 View  Result Date: 07/02/2016 CLINICAL DATA:  Central chest pain with dyspnea and irregular heart rate EXAM: CHEST  2 VIEW COMPARISON:  05/25/2016 FINDINGS: The heart size and mediastinal contours are within normal limits. Both lungs are clear. The visualized skeletal structures are unremarkable. IMPRESSION: No active cardiopulmonary disease. Electronically Signed   By: [name] Royalty M.D.   On: 07/02/2016 18:44    Procedures Procedures (including critical care time)  Medications Ordered in ED Medications - No data to  display   Initial Impression /  Assessment and Plan / ED Course  I have reviewed the triage vital signs and the nursing notes.  Pertinent labs & imaging results that were available during my care of the patient were reviewed by me and considered in my medical decision making (see chart for details).  Clinical Course     On repeat exam the patient is awake and alert, in no distress, vital signs remained unremarkable. We discussed all findings including elevated creatinine, potassium. I discussed the importance of following up with nephrology, the patient states that she recently had one missed appointment, but will follow up with nephrology.  This patient presenting with palpitations is awake and alert, in no distress, has no evidence for ongoing coronary ischemia, infection, electrolyte abnormalities, though she does have noted persistent chronic kidney disease. Patient had reassuring findings beyond that, and absent evidence for other acute new pathology, she was discharged in stable condition. Final Clinical Impressions(s) / ED Diagnoses  Palpitations   Carmin Muskrat, MD 07/02/16 2057

## 2016-07-02 NOTE — Discharge Instructions (Signed)
As discussed, your evaluation today has been largely reassuring.  But, it is important that you monitor your condition carefully, and do not hesitate to return to the ED if you develop new, or concerning changes in your condition. ? ?Otherwise, please follow-up with your physician for appropriate ongoing care. ? ?

## 2016-07-02 NOTE — ED Triage Notes (Signed)
Pt reports that she has been experiencing chest pain, pain with breathing and heart feels like it stops than speeds

## 2016-07-12 ENCOUNTER — Encounter (HOSPITAL_COMMUNITY): Payer: Self-pay | Admitting: Emergency Medicine

## 2016-07-12 ENCOUNTER — Emergency Department (HOSPITAL_COMMUNITY)
Admission: EM | Admit: 2016-07-12 | Discharge: 2016-07-12 | Disposition: A | Discharge status: Home / Self Care | Payer: Medicaid Other | Attending: Emergency Medicine | Admitting: Emergency Medicine

## 2016-07-12 ENCOUNTER — Emergency Department (HOSPITAL_COMMUNITY): Payer: Medicaid Other

## 2016-07-12 DIAGNOSIS — Z87891 Personal history of nicotine dependence: Secondary | ICD-10-CM | POA: Diagnosis not present

## 2016-07-12 DIAGNOSIS — Z9114 Patient's other noncompliance with medication regimen: Secondary | ICD-10-CM | POA: Insufficient documentation

## 2016-07-12 DIAGNOSIS — I10 Essential (primary) hypertension: Secondary | ICD-10-CM | POA: Insufficient documentation

## 2016-07-12 DIAGNOSIS — E119 Type 2 diabetes mellitus without complications: Secondary | ICD-10-CM | POA: Diagnosis not present

## 2016-07-12 DIAGNOSIS — J449 Chronic obstructive pulmonary disease, unspecified: Secondary | ICD-10-CM | POA: Insufficient documentation

## 2016-07-12 DIAGNOSIS — Z79899 Other long term (current) drug therapy: Secondary | ICD-10-CM | POA: Insufficient documentation

## 2016-07-12 DIAGNOSIS — Z7984 Long term (current) use of oral hypoglycemic drugs: Secondary | ICD-10-CM | POA: Diagnosis not present

## 2016-07-12 DIAGNOSIS — R0602 Shortness of breath: Secondary | ICD-10-CM

## 2016-07-12 LAB — CBC WITH DIFFERENTIAL/PLATELET
BASOS ABS: 0 10*3/uL (ref 0.0–0.1)
BASOS PCT: 1 %
Eosinophils Absolute: 0.2 10*3/uL (ref 0.0–0.7)
Eosinophils Relative: 2 %
HEMATOCRIT: 35.1 % — AB (ref 36.0–46.0)
HEMOGLOBIN: 11.6 g/dL — AB (ref 12.0–15.0)
LYMPHS PCT: 50 %
Lymphs Abs: 3.6 10*3/uL (ref 0.7–4.0)
MCH: 28.4 pg (ref 26.0–34.0)
MCHC: 33 g/dL (ref 30.0–36.0)
MCV: 85.8 fL (ref 78.0–100.0)
MONO ABS: 0.3 10*3/uL (ref 0.1–1.0)
MONOS PCT: 4 %
NEUTROS ABS: 3.1 10*3/uL (ref 1.7–7.7)
NEUTROS PCT: 43 %
Platelets: 150 10*3/uL (ref 150–400)
RBC: 4.09 MIL/uL (ref 3.87–5.11)
RDW: 15 % (ref 11.5–15.5)
WBC: 7.1 10*3/uL (ref 4.0–10.5)

## 2016-07-12 LAB — BASIC METABOLIC PANEL
ANION GAP: 8 (ref 5–15)
BUN: 57 mg/dL — ABNORMAL HIGH (ref 6–20)
CALCIUM: 9.3 mg/dL (ref 8.9–10.3)
CHLORIDE: 109 mmol/L (ref 101–111)
CO2: 22 mmol/L (ref 22–32)
Creatinine, Ser: 4.4 mg/dL — ABNORMAL HIGH (ref 0.44–1.00)
GFR calc non Af Amer: 11 mL/min — ABNORMAL LOW (ref >60)
GFR, EST AFRICAN AMERICAN: 13 mL/min — AB (ref >60)
GLUCOSE: 256 mg/dL — AB (ref 65–99)
Potassium: 4.3 mmol/L (ref 3.5–5.1)
Sodium: 139 mmol/L (ref 135–145)

## 2016-07-12 MED ORDER — FLUTICASONE PROPIONATE 50 MCG/ACT NA SUSP: 2.0000 | Freq: Every day | NASAL | 2 refills | Status: DC

## 2016-07-12 MED ORDER — ALBUTEROL SULFATE (2.5 MG/3ML) 0.083% IN NEBU
5.0000 mg | INHALATION_SOLUTION | Freq: Once | RESPIRATORY_TRACT | Status: AC
  Administered 2016-07-12: 5 mg via RESPIRATORY_TRACT
  Filled 2016-07-12: qty 6

## 2016-07-12 NOTE — ED Notes (Signed)
Respiratory paged at this time.

## 2016-07-12 NOTE — Discharge Instructions (Signed)
Your xray is normal  Please take the Fluticasone (flonase) nasal spray every morning and zyrtec or claritin every evening. See Dr. Luan Pulling in the next 2-3 days if no improvement ER if you worsen.

## 2016-07-12 NOTE — ED Triage Notes (Signed)
Pt reports she is SOB. Has been out "doing things and states when she lays down she can't breath. Pt c/o nasal congestion. Pt speaking in complete sentences, O2 on RA 98%. Lung sounds clear to auscultation. EKG completed in Triage.

## 2016-07-12 NOTE — ED Provider Notes (Signed)
[facility] DEPT Provider Note   CSN: [id number] Arrival date & time: 07/12/16  1738     History   Chief Complaint Chief Complaint  Patient presents with  . Shortness of Breath    HPI YASAMIN KAREL is a 50 y.o. female.  HPI   The patient is a 50 year old female who is morbidly obese and has a history of bipolar disorder, COPD and obstructive sleep apnea for which she uses a CPAP machine at night. She reports that over the last 24 hours she has developed difficulty with her CPAP machine stating that she can't breathe and short of breath. When asked what this means she states that her nose is stuffy. She denies any chest discomfort, wheezing, coughing other than the occasional clearing of her throat. She has nasal drainage going down her throat as well as a small amount coming out her front of her nose. There is no fevers, chills, nausea, vomiting, diarrhea and she has no swelling in her lower extremities. She is known to have recently been admitted to the hospital for chest pain, she was evaluated with an echocardiogram which was totally normal and was to be set up for an outpatient stress test. She denies any prior coronary disease and states she does not have any heart disease at this time. During the last admission it was noted that her creatinine was slightly higher than her previous levels at 4.2, she has been trending up over the last 2 years from a level of 2 and gradually moving in the wrong direction. She does report that she has diabetes and hypertension but states she tries to take her medications that she should however the record reveals otherwise with a history of noncompliance.  Past Medical History:  Diagnosis Date  . Acid reflux   . Anemia   . Arthritis   . Bipolar 1 disorder ([place])   . Cervical radiculopathy   . COPD (chronic obstructive pulmonary disease) ([place]) 2014   bronchitis  . Degenerative disc disease, thoracic   . Depression   . Diabetes mellitus   .  Hypertension   . Noncompliance with medication regimen   . Obesity (BMI 30-39.9)   . OSA (obstructive sleep apnea)    cpap  . RLS (restless legs syndrome)     Patient Active Problem List   Diagnosis Date Noted  . Acute on chronic renal failure ([place]) 05/25/2016  . Abdominal pain 04/26/2014  . Cholelithiasis 04/26/2014  . Acute renal failure ([place]) 04/26/2014  . Dehydration 04/26/2014  . Hyponatremia 04/26/2014  . Gallstones 04/06/2014  . Uncontrolled diabetes mellitus ([place]) 10/09/2013  . Breast abscess 05/30/2013  . Diabetes mellitus, type 2 ([place]) 05/30/2013  . Hyperglycemia 05/30/2013  . Internal hemorrhoids with other complication 80/99/8338  . Umbilical hernia without mention of obstruction or gangrene 04/15/2013  . Anemia 04/15/2013  . DM 05/13/2010  . Hyperlipidemia 05/13/2010  . OBESITY 05/13/2010  . Depression 05/13/2010  . RESTLESS LEG SYNDROME 05/13/2010  . Essential hypertension 05/13/2010  . GERD 05/13/2010  . RECTAL BLEEDING 05/13/2010  . Sleep apnea 05/13/2010  . Dysphagia, oropharyngeal phase 05/13/2010    Past Surgical History:  Procedure Laterality Date  . BIOPSY N/A 05/17/2013   Procedure: BIOPSY;  Surgeon: [name], MD;  Location: [facility];  Service: Endoscopy;  Laterality: N/A;  . CHOLECYSTECTOMY N/A 04/27/2014   Procedure: LAPAROSCOPIC CHOLECYSTECTOMY;  Surgeon: [name], MD;  Location: [facility];  Service: General;  Laterality: N/A;  . COLONOSCOPY  06/06/2010  XMI:WOEHOZ bleeding secondary to internal hemorrhoids but incomplete evaluation secondary to poor right colon prep/small rectal and sigmoid colon polyps (hyperplastic). PROPOFOL  . COLONOSCOPY  May 2013   Dr. Gilliam/[facility]: 5 mm a descending colon polyp, hyperplastic. Adequate bowel prep.  . ENDOMETRIAL ABLATION    . ESOPHAGOGASTRODUODENOSCOPY  06/06/2010   YYQ:MGNOIBB erythema and edema of body of stomach, with sessile polypoid lesions. bx benign. no h.pylori  .  ESOPHAGOGASTRODUODENOSCOPY (EGD) WITH PROPOFOL N/A 05/17/2013   Dr. Oneida Alar: normal esophagus, moderate nodular gastritis, negative path, empiric Savary dilation  . FLEXIBLE SIGMOIDOSCOPY N/A 05/17/2013   Dr. Oneida Alar: moderate sized internal hemorrhoids  . HEMORRHOID BANDING N/A 05/17/2013   Procedure: HEMORRHOID BANDING;  Surgeon: [name], MD;  Location: [facility];  Service: Endoscopy;  Laterality: N/A;  2 bands placed  . INCISION AND DRAINAGE ABSCESS Left 10/11/2013   Procedure: INCISION AND DRAINAGE AND DEBRIDEMENT LEFT BREAST  ABSCESS;  Surgeon: [name], MD;  Location: [facility];  Service: General;  Laterality: Left;  . IRRIGATION AND DEBRIDEMENT ABSCESS Right 06/01/2013   Procedure: INCISION AND DRAINAGE AND DEBRIDEMENT ABSCESS RIGHT BREAST;  Surgeon: [name], MD;  Location: [facility];  Service: General;  Laterality: Right;  . SAVORY DILATION N/A 05/17/2013   Procedure: SAVORY DILATION;  Surgeon: [name], MD;  Location: [facility];  Service: Endoscopy;  Laterality: N/A;  14/15/16  . TUBAL LIGATION      OB History    Gravida Para Term Preterm AB Living   2 2 1 1   2    SAB TAB Ectopic Multiple Live Births                   Home Medications    Prior to Admission medications   Medication Sig Start Date End Date Taking? Authorizing Provider  albuterol (PROVENTIL) (2.5 MG/3ML) 0.083% nebulizer solution Take 2.5 mg by nebulization 4 (four) times daily.    Yes Historical Provider, MD  ALPRAZolam Duanne Moron) 1 MG tablet Take 1 mg by mouth 4 (four) times daily.     Yes Historical Provider, MD  baclofen (LIORESAL) 20 MG tablet Take 20 mg by mouth 3 (three) times daily as needed for muscle spasms.   Yes Historical Provider, MD  carvedilol (COREG) 12.5 MG tablet Take 1 tablet (12.5 mg total) by mouth 2 (two) times daily with a meal. 05/26/16  Yes Cherene Altes, MD  doxycycline (VIBRAMYCIN) 100 MG capsule Take 100 mg by mouth 2 (two) times daily.   Yes Historical Provider,  MD  Exenatide ER (BYDUREON) 2 MG PEN Inject 2 mg into the skin once a week.   Yes Historical Provider, MD  furosemide (LASIX) 20 MG tablet Take 1 tablet (20 mg total) by mouth daily. 05/27/16  Yes Cherene Altes, MD  hydrALAZINE (APRESOLINE) 10 MG tablet Take 1 tablet (10 mg total) by mouth every 8 (eight) hours. 05/26/16  Yes Cherene Altes, MD  HYDROcodone-acetaminophen (NORCO/VICODIN) 5-325 MG tablet Take 1 tablet by mouth every 4 (four) hours as needed. Take with food Patient taking differently: Take 1 tablet by mouth every 4 (four) hours as needed (pain). Take with food 03/30/16  Yes Lily Kocher, PA-C  lisinopril-hydrochlorothiazide (PRINZIDE,ZESTORETIC) 20-12.5 MG tablet Take 1 tablet by mouth daily.   Yes Historical Provider, MD  metFORMIN (GLUCOPHAGE) 1000 MG tablet Take 1,000 mg by mouth 2 (two) times daily with a meal.   Yes Historical Provider, MD  PRESCRIPTION MEDICATION Inhale into the lungs at  bedtime. CPAP   Yes Historical Provider, MD  rOPINIRole (REQUIP) 1 MG tablet Take 1 mg by mouth at bedtime.    Yes Historical Provider, MD  Steam Inhalers (VICKS VAPO THERAPY STEAM INHAL) MISC 1 application by Does not apply route every 4 (four) hours as needed.   Yes Historical Provider, MD  fluticasone (FLONASE) 50 MCG/ACT nasal spray Place 2 sprays into both nostrils daily. 07/12/16   Noemi Chapel, MD    Family History Family History  Problem Relation Age of Onset  . Adopted: Yes  . Family history unknown: Yes    Social History Social History  Substance Use Topics  . Smoking status: Former Smoker    Packs/day: 1.50    Years: 25.00    Types: Cigarettes    Quit date: 10/23/2007  . Smokeless tobacco: Former Systems developer     Comment: Quit x 6 years ago  . Alcohol use No     Allergies   Codeine   Review of Systems Review of Systems  All other systems reviewed and are negative.    Physical Exam Updated Vital Signs BP 120/59   Pulse 84   Temp 97.5 F (36.4 C) (Oral)    Resp 13   Ht 5\' 8"  (1.727 m)   Wt 300 lb (136.1 kg)   SpO2 98%   BMI 45.61 kg/m   Physical Exam  Constitutional: She appears well-developed and well-nourished. No distress.  HENT:  Head: Normocephalic and atraumatic.  Mouth/Throat: Oropharynx is clear and moist. No oropharyngeal exudate.  Eyes: Conjunctivae and EOM are normal. Pupils are equal, round, and reactive to light. Right eye exhibits no discharge. Left eye exhibits no discharge. No scleral icterus.  Neck: Normal range of motion. Neck supple. No JVD present. No thyromegaly present.  Cardiovascular: Normal rate, regular rhythm, normal heart sounds and intact distal pulses.  Exam reveals no gallop and no friction rub.   No murmur heard. Pulmonary/Chest: Effort normal and breath sounds normal. No respiratory distress. She has no wheezes. She has no rales.  Abdominal: Soft. Bowel sounds are normal. She exhibits no distension and no mass. There is no tenderness.  Musculoskeletal: Normal range of motion. She exhibits no edema or tenderness.  Lymphadenopathy:    She has no cervical adenopathy.  Neurological: She is alert. Coordination normal.  Skin: Skin is warm and dry. No rash noted. No erythema.  Psychiatric: She has a normal mood and affect. Her behavior is normal.  Nursing note and vitals reviewed.    ED Treatments / Results  Labs (all labs ordered are listed, but only abnormal results are displayed) Labs Reviewed  CBC WITH DIFFERENTIAL/PLATELET  BASIC METABOLIC PANEL    EKG  EKG Interpretation  Date/Time:  Saturday July 12 2016 17:45:00 EST Ventricular Rate:  77 PR Interval:  142 QRS Duration: 68 QT Interval:  366 QTC Calculation: 414 R Axis:   57 Text Interpretation:  Normal sinus rhythm T wave abnormality, consider inferolateral ischemia Abnormal ECG compared wtih prior ECG's, no significant changes Confirmed by Sabra Heck  MD, Tatiyana Foucher ([id number]) on 07/12/2016 6:29:35 PM       Radiology Dg Chest 2  View  Result Date: 07/12/2016 CLINICAL DATA:  Shortness of breath. EXAM: CHEST  2 VIEW COMPARISON:  07/02/2016 FINDINGS: The heart size and mediastinal contours are within normal limits. Both lungs are clear. The visualized skeletal structures are unremarkable. IMPRESSION: No active cardiopulmonary disease. Electronically Signed   By: Fidela Salisbury M.D.   On: 07/12/2016 19:51  Procedures Procedures (including critical care time)  Medications Ordered in ED Medications  albuterol (PROVENTIL) (2.5 MG/3ML) 0.083% nebulizer solution 5 mg (5 mg Nebulization Given 07/12/16 1936)     Initial Impression / Assessment and Plan / ED Course  I have reviewed the triage vital signs and the nursing notes.  Pertinent labs & imaging results that were available during my care of the patient were reviewed by me and considered in my medical decision making (see chart for details).  Clinical Course     The patient has no wheezing, speaks in full sentences, no respiratory distress, no rales, no increased work of breathing. Heart rate is normal, no abnormal sounds, no JVD, no peripheral edema. It does appear that she has some nasal congestion, x-ray was ordered by nursing prior to my evaluation. The patient is well-appearing and I anticipate discharge as I do not think this is related to her heart or her lungs.  Xray normal Home with flonase Suspect nasal congestion - pt in agreement Neb with no improvement - b/c it's her nasal passages and not her lungs which sound normal.  Final Clinical Impressions(s) / ED Diagnoses   Final diagnoses:  SOB (shortness of breath)    New Prescriptions New Prescriptions   FLUTICASONE (FLONASE) 50 MCG/ACT NASAL SPRAY    Place 2 sprays into both nostrils daily.     Noemi Chapel, MD 07/12/16 2009

## 2016-07-23 ENCOUNTER — Emergency Department (HOSPITAL_COMMUNITY): Payer: Medicaid Other

## 2016-07-23 ENCOUNTER — Encounter (HOSPITAL_COMMUNITY): Payer: Self-pay | Admitting: Emergency Medicine

## 2016-07-23 ENCOUNTER — Emergency Department (HOSPITAL_COMMUNITY)
Admission: EM | Admit: 2016-07-23 | Discharge: 2016-07-23 | Disposition: A | Discharge status: Home / Self Care | Payer: Medicaid Other | Attending: Emergency Medicine | Admitting: Emergency Medicine

## 2016-07-23 DIAGNOSIS — J449 Chronic obstructive pulmonary disease, unspecified: Secondary | ICD-10-CM | POA: Insufficient documentation

## 2016-07-23 DIAGNOSIS — R0602 Shortness of breath: Secondary | ICD-10-CM | POA: Insufficient documentation

## 2016-07-23 DIAGNOSIS — E119 Type 2 diabetes mellitus without complications: Secondary | ICD-10-CM | POA: Diagnosis not present

## 2016-07-23 DIAGNOSIS — R05 Cough: Secondary | ICD-10-CM | POA: Diagnosis not present

## 2016-07-23 DIAGNOSIS — R0981 Nasal congestion: Secondary | ICD-10-CM

## 2016-07-23 DIAGNOSIS — Z87891 Personal history of nicotine dependence: Secondary | ICD-10-CM | POA: Diagnosis not present

## 2016-07-23 DIAGNOSIS — I10 Essential (primary) hypertension: Secondary | ICD-10-CM | POA: Insufficient documentation

## 2016-07-23 DIAGNOSIS — Z79899 Other long term (current) drug therapy: Secondary | ICD-10-CM | POA: Insufficient documentation

## 2016-07-23 MED ORDER — ALBUTEROL SULFATE (2.5 MG/3ML) 0.083% IN NEBU
5.0000 mg | INHALATION_SOLUTION | Freq: Once | RESPIRATORY_TRACT | Status: AC
  Administered 2016-07-23: 5 mg via RESPIRATORY_TRACT
  Filled 2016-07-23: qty 6

## 2016-07-23 MED ORDER — ALBUTEROL SULFATE HFA 108 (90 BASE) MCG/ACT IN AERS: 2.0000 | INHALATION_SPRAY | RESPIRATORY_TRACT | 0 refills | Status: DC | PRN

## 2016-07-23 NOTE — ED Triage Notes (Signed)
Patient states that her nebulizer is not working. States she has bronchitis and shortness of breath. She states she has been to this ED multiple times with no improvement.

## 2016-07-23 NOTE — Discharge Instructions (Signed)
Take over the counter decongestant (use a formulation safe for use with high blood pressure), as directed on packaging, for the next week.  Use over the counter normal saline nasal spray, as instructed in the Emergency Department, several times per day for the next 2 weeks.  Call your regular medical doctor tomorrow to schedule a follow up appointment this week.  Return to the Emergency Department immediately if worsening.

## 2016-07-23 NOTE — ED Provider Notes (Signed)
[facility] DEPT Provider Note   CSN: [id number] Arrival date & time: 07/23/16  1726     History   Chief Complaint Chief Complaint  Patient presents with  . Cough  . Shortness of Breath    HPI Martha [name] is a 50 y.o. female.  HPI  Pt was seen at 1815. Per pt, c/o gradual onset and persistence of constant "SOB" for the past 2 to 3 days. Pt describes her SOB as "my nose is stuffy." Denies wheezing/cough, no CP/palpitations, no abd pain, no N/V/D, no fevers. The symptoms have been associated with no other complaints. The patient has a significant history of similar symptoms previously, recently being evaluated for this complaint and multiple prior evals for same.     Past Medical History:  Diagnosis Date  . Acid reflux   . Anemia   . Arthritis   . Bipolar 1 disorder ([place])   . Cervical radiculopathy   . COPD (chronic obstructive pulmonary disease) ([place]) 2014   bronchitis  . Degenerative disc disease, thoracic   . Depression   . Diabetes mellitus   . Hypertension   . Noncompliance with medication regimen   . Obesity (BMI 30-39.9)   . OSA (obstructive sleep apnea)    cpap  . RLS (restless legs syndrome)     Patient Active Problem List   Diagnosis Date Noted  . Acute on chronic renal failure ([place]) 05/25/2016  . Abdominal pain 04/26/2014  . Cholelithiasis 04/26/2014  . Acute renal failure ([place]) 04/26/2014  . Dehydration 04/26/2014  . Hyponatremia 04/26/2014  . Gallstones 04/06/2014  . Uncontrolled diabetes mellitus ([place]) 10/09/2013  . Breast abscess 05/30/2013  . Diabetes mellitus, type 2 ([place]) 05/30/2013  . Hyperglycemia 05/30/2013  . Internal hemorrhoids with other complication 67/89/3810  . Umbilical hernia without mention of obstruction or gangrene 04/15/2013  . Anemia 04/15/2013  . DM 05/13/2010  . Hyperlipidemia 05/13/2010  . OBESITY 05/13/2010  . Depression 05/13/2010  . RESTLESS LEG SYNDROME 05/13/2010  . Essential hypertension 05/13/2010  .  GERD 05/13/2010  . RECTAL BLEEDING 05/13/2010  . Sleep apnea 05/13/2010  . Dysphagia, oropharyngeal phase 05/13/2010    Past Surgical History:  Procedure Laterality Date  . BIOPSY N/A 05/17/2013   Procedure: BIOPSY;  Surgeon: [name], MD;  Location: [facility];  Service: Endoscopy;  Laterality: N/A;  . CHOLECYSTECTOMY N/A 04/27/2014   Procedure: LAPAROSCOPIC CHOLECYSTECTOMY;  Surgeon: [name], MD;  Location: [facility];  Service: General;  Laterality: N/A;  . COLONOSCOPY  06/06/2010   FBP:ZWCHEN bleeding secondary to internal hemorrhoids but incomplete evaluation secondary to poor right colon prep/small rectal and sigmoid colon polyps (hyperplastic). PROPOFOL  . COLONOSCOPY  May 2013   Dr. Gilliam/[facility]: 5 mm a descending colon polyp, hyperplastic. Adequate bowel prep.  . ENDOMETRIAL ABLATION    . ESOPHAGOGASTRODUODENOSCOPY  06/06/2010   IDP:OEUMPNT erythema and edema of body of stomach, with sessile polypoid lesions. bx benign. no h.pylori  . ESOPHAGOGASTRODUODENOSCOPY (EGD) WITH PROPOFOL N/A 05/17/2013   Dr. Oneida Alar: normal esophagus, moderate nodular gastritis, negative path, empiric Savary dilation  . FLEXIBLE SIGMOIDOSCOPY N/A 05/17/2013   Dr. Oneida Alar: moderate sized internal hemorrhoids  . HEMORRHOID BANDING N/A 05/17/2013   Procedure: HEMORRHOID BANDING;  Surgeon: [name], MD;  Location: [facility];  Service: Endoscopy;  Laterality: N/A;  2 bands placed  . INCISION AND DRAINAGE ABSCESS Left 10/11/2013   Procedure: INCISION AND DRAINAGE AND DEBRIDEMENT LEFT BREAST  ABSCESS;  Surgeon: [name], MD;  Location: [facility];  Service: General;  Laterality: Left;  . IRRIGATION AND DEBRIDEMENT ABSCESS Right 06/01/2013   Procedure: INCISION AND DRAINAGE AND DEBRIDEMENT ABSCESS RIGHT BREAST;  Surgeon: [name], MD;  Location: [facility];  Service: General;  Laterality: Right;  . SAVORY DILATION N/A 05/17/2013   Procedure: SAVORY DILATION;  Surgeon: [name], MD;   Location: [facility];  Service: Endoscopy;  Laterality: N/A;  14/15/16  . TUBAL LIGATION      OB History    Gravida Para Term Preterm AB Living   2 2 1 1   2    SAB TAB Ectopic Multiple Live Births                   Home Medications    Prior to Admission medications   Medication Sig Start Date End Date Taking? Authorizing Provider  albuterol (PROVENTIL) (2.5 MG/3ML) 0.083% nebulizer solution Take 2.5 mg by nebulization every 6 (six) hours as needed for wheezing or shortness of breath.    Yes Historical Provider, MD  ALPRAZolam Duanne Moron) 1 MG tablet Take 1 mg by mouth 4 (four) times daily.     Yes Historical Provider, MD  baclofen (LIORESAL) 20 MG tablet Take 20 mg by mouth 3 (three) times daily as needed for muscle spasms.   Yes Historical Provider, MD  carvedilol (COREG) 12.5 MG tablet Take 1 tablet (12.5 mg total) by mouth 2 (two) times daily with a meal. 05/26/16  Yes Cherene Altes, MD  Exenatide ER (BYDUREON) 2 MG PEN Inject 2 mg into the skin every Friday.    Yes Historical Provider, MD  fluticasone (FLONASE) 50 MCG/ACT nasal spray Place 2 sprays into both nostrils at bedtime.   Yes Historical Provider, MD  furosemide (LASIX) 20 MG tablet Take 20 mg by mouth daily as needed for edema.   Yes Historical Provider, MD  hydrALAZINE (APRESOLINE) 10 MG tablet Take 1 tablet (10 mg total) by mouth every 8 (eight) hours. 05/26/16  Yes Cherene Altes, MD  HYDROcodone-acetaminophen (NORCO/VICODIN) 5-325 MG tablet Take 1 tablet by mouth every 4 (four) hours as needed for moderate pain.   Yes Historical Provider, MD  lisinopril-hydrochlorothiazide (PRINZIDE,ZESTORETIC) 20-12.5 MG tablet Take 1 tablet by mouth daily.   Yes Historical Provider, MD  metFORMIN (GLUCOPHAGE) 1000 MG tablet Take 1,000 mg by mouth 2 (two) times daily with a meal.   Yes Historical Provider, MD  rOPINIRole (REQUIP) 1 MG tablet Take 1 mg by mouth at bedtime.    Yes Historical Provider, MD    Family History Family History   Problem Relation Age of Onset  . Adopted: Yes  . Family history unknown: Yes    Social History Social History  Substance Use Topics  . Smoking status: Former Smoker    Packs/day: 1.50    Years: 25.00    Types: Cigarettes    Quit date: 10/23/2007  . Smokeless tobacco: Former Systems developer     Comment: Quit x 6 years ago  . Alcohol use No     Allergies   Codeine   Review of Systems Review of Systems ROS: Statement: All systems negative except as marked or noted in the HPI; Constitutional: Negative for fever and chills. ; ; Eyes: Negative for eye pain, redness and discharge. ; ; ENMT: Negative for ear pain, hoarseness, sore throat. +nasal congestion, sinus pressure. ; ; Cardiovascular: Negative for chest pain, palpitations, diaphoresis, dyspnea and peripheral edema. ; ; Respiratory: Negative for cough, wheezing and stridor. ; ;  Gastrointestinal: Negative for nausea, vomiting, diarrhea, abdominal pain, blood in stool, hematemesis, jaundice and rectal bleeding. . ; ; Genitourinary: Negative for dysuria, flank pain and hematuria. ; ; Musculoskeletal: Negative for back pain and neck pain. Negative for swelling and trauma.; ; Skin: Negative for pruritus, rash, abrasions, blisters, bruising and skin lesion.; ; Neuro: Negative for headache, lightheadedness and neck stiffness. Negative for weakness, altered level of consciousness, altered mental status, extremity weakness, paresthesias, involuntary movement, seizure and syncope.       Physical Exam Updated Vital Signs BP 143/67 (BP Location: Right Arm)   Pulse 89   Temp 98.1 F (36.7 C) (Oral)   Resp 16   Ht 5\' 8"  (1.727 m)   Wt 300 lb (136.1 kg)   SpO2 99%   BMI 45.61 kg/m   Physical Exam 1820: Physical examination:  Nursing notes reviewed; Vital signs and O2 SAT reviewed;  Constitutional: Well developed, Well nourished, Well hydrated, In no acute distress; Head:  Normocephalic, atraumatic; Eyes: EOMI, PERRL, No scleral icterus; ENMT:   +edemetous nasal turbinates bilat with clear rhinorrhea. Mouth and pharynx normal, Mucous membranes moist; Neck: Supple, Full range of motion, No lymphadenopathy; Cardiovascular: Regular rate and rhythm, No gallop; Respiratory: Breath sounds clear & equal bilaterally, No wheezes.  Speaking full sentences with ease, Normal respiratory effort/excursion; Chest: Nontender, Movement normal; Abdomen: Soft, Nontender, Nondistended, Normal bowel sounds; Genitourinary: No CVA tenderness; Extremities: Pulses normal, No tenderness, No edema, No calf edema or asymmetry.; Neuro: AA&Ox3, Major CN grossly intact.  Speech clear. No gross focal motor or sensory deficits in extremities.; Skin: Color normal, Warm, Dry.   ED Treatments / Results  Labs (all labs ordered are listed, but only abnormal results are displayed) Labs Reviewed - No data to display  EKG  EKG Interpretation  Date/Time:  Wednesday July 23 2016 17:51:56 EST Ventricular Rate:  83 PR Interval:    QRS Duration: 83 QT Interval:  372 QTC Calculation: 438 R Axis:   40 Text Interpretation:  Sinus rhythm Nonspecific T abnormalities, lateral leads Baseline wander When compared with ECG of 07/02/2016 No significant change was found Confirmed by [facility]  MD, Nunzio Cory [phone]) on 07/23/2016 6:30:38 PM       Radiology   Procedures Procedures (including critical care time)  Medications Ordered in ED Medications  albuterol (PROVENTIL) (2.5 MG/3ML) 0.083% nebulizer solution 5 mg (5 mg Nebulization Given 07/23/16 1746)     Initial Impression / Assessment and Plan / ED Course  I have reviewed the triage vital signs and the nursing notes.  Pertinent labs & imaging results that were available during my care of the patient were reviewed by me and considered in my medical decision making (see chart for details).  MDM Reviewed: nursing note, previous chart and vitals Reviewed previous: ECG Interpretation: ECG and x-ray    Dg Chest 2  View Result Date: 07/23/2016 CLINICAL DATA:  Acute onset of shortness of breath and nonproductive cough. Initial encounter. EXAM: CHEST  2 VIEW COMPARISON:  Chest radiograph performed 07/12/2016 FINDINGS: The lungs are well-aerated and clear. There is no evidence of focal opacification, pleural effusion or pneumothorax. The heart is normal in size; the mediastinal contour is within normal limits. No acute osseous abnormalities are seen. IMPRESSION: No acute cardiopulmonary process seen. Electronically Signed   By: Garald Balding M.D.   On: 07/23/2016 18:20    1835:  Pt describes her SOB as "my nose is stuffy." Denies cough/wheezing or feeling SOB related to her chest. Pt received  neb, CXR and EKG before my evaluation. Pt states she "just realized today that my neb machine is old" and she "needs to get a new one." States she "doesn't think it works all that good anymore." Will rx albuterol MDI as backup. Pt received steroid nasal spray previous ED visit; encouraged to use this and NS nasal sprays several times per day. Pt verb understanding, states she is ready to leave now. Dx and testing d/w pt and family.  Questions answered.  Verb understanding, agreeable to d/c home with outpt f/u.   Final Clinical Impressions(s) / ED Diagnoses   Final diagnoses:  None    New Prescriptions New Prescriptions   No medications on file     Francine Graven, DO 07/25/16 1849

## 2016-07-26 ENCOUNTER — Emergency Department (HOSPITAL_COMMUNITY)
Admission: EM | Admit: 2016-07-26 | Discharge: 2016-07-26 | Disposition: A | Discharge status: Home / Self Care | Payer: Medicaid Other | Attending: Emergency Medicine | Admitting: Emergency Medicine

## 2016-07-26 ENCOUNTER — Emergency Department (HOSPITAL_COMMUNITY): Payer: Medicaid Other

## 2016-07-26 ENCOUNTER — Encounter (HOSPITAL_COMMUNITY): Payer: Self-pay | Admitting: *Deleted

## 2016-07-26 DIAGNOSIS — R05 Cough: Secondary | ICD-10-CM | POA: Diagnosis present

## 2016-07-26 DIAGNOSIS — N3000 Acute cystitis without hematuria: Secondary | ICD-10-CM | POA: Diagnosis not present

## 2016-07-26 DIAGNOSIS — Z87891 Personal history of nicotine dependence: Secondary | ICD-10-CM | POA: Diagnosis not present

## 2016-07-26 DIAGNOSIS — I10 Essential (primary) hypertension: Secondary | ICD-10-CM | POA: Diagnosis not present

## 2016-07-26 DIAGNOSIS — Z7984 Long term (current) use of oral hypoglycemic drugs: Secondary | ICD-10-CM | POA: Diagnosis not present

## 2016-07-26 DIAGNOSIS — J449 Chronic obstructive pulmonary disease, unspecified: Secondary | ICD-10-CM | POA: Insufficient documentation

## 2016-07-26 DIAGNOSIS — E119 Type 2 diabetes mellitus without complications: Secondary | ICD-10-CM | POA: Insufficient documentation

## 2016-07-26 DIAGNOSIS — Z79899 Other long term (current) drug therapy: Secondary | ICD-10-CM | POA: Diagnosis not present

## 2016-07-26 LAB — COMPREHENSIVE METABOLIC PANEL
ALT: 19 U/L (ref 14–54)
AST: 13 U/L — AB (ref 15–41)
Albumin: 3.5 g/dL (ref 3.5–5.0)
Alkaline Phosphatase: 79 U/L (ref 38–126)
Anion gap: 7 (ref 5–15)
BILIRUBIN TOTAL: 0.6 mg/dL (ref 0.3–1.2)
BUN: 49 mg/dL — AB (ref 6–20)
CALCIUM: 9.4 mg/dL (ref 8.9–10.3)
CO2: 20 mmol/L — ABNORMAL LOW (ref 22–32)
CREATININE: 4.73 mg/dL — AB (ref 0.44–1.00)
Chloride: 112 mmol/L — ABNORMAL HIGH (ref 101–111)
GFR, EST AFRICAN AMERICAN: 12 mL/min — AB (ref >60)
GFR, EST NON AFRICAN AMERICAN: 10 mL/min — AB (ref >60)
Glucose, Bld: 165 mg/dL — ABNORMAL HIGH (ref 65–99)
Potassium: 4 mmol/L (ref 3.5–5.1)
Sodium: 139 mmol/L (ref 135–145)
TOTAL PROTEIN: 7.1 g/dL (ref 6.5–8.1)

## 2016-07-26 LAB — CBC WITH DIFFERENTIAL/PLATELET
BASOS ABS: 0 10*3/uL (ref 0.0–0.1)
Basophils Relative: 1 %
EOS PCT: 2 %
Eosinophils Absolute: 0.1 10*3/uL (ref 0.0–0.7)
HEMATOCRIT: 37.8 % (ref 36.0–46.0)
Hemoglobin: 12.6 g/dL (ref 12.0–15.0)
LYMPHS ABS: 2.2 10*3/uL (ref 0.7–4.0)
LYMPHS PCT: 40 %
MCH: 28.3 pg (ref 26.0–34.0)
MCHC: 33.3 g/dL (ref 30.0–36.0)
MCV: 84.9 fL (ref 78.0–100.0)
Monocytes Absolute: 0.4 10*3/uL (ref 0.1–1.0)
Monocytes Relative: 7 %
NEUTROS ABS: 2.7 10*3/uL (ref 1.7–7.7)
Neutrophils Relative %: 50 %
Platelets: 199 10*3/uL (ref 150–400)
RBC: 4.45 MIL/uL (ref 3.87–5.11)
RDW: 14.7 % (ref 11.5–15.5)
WBC: 5.4 10*3/uL (ref 4.0–10.5)

## 2016-07-26 LAB — URINALYSIS, ROUTINE W REFLEX MICROSCOPIC
Bilirubin Urine: NEGATIVE
GLUCOSE, UA: 50 mg/dL — AB
HGB URINE DIPSTICK: NEGATIVE
Ketones, ur: NEGATIVE mg/dL
LEUKOCYTES UA: NEGATIVE
NITRITE: NEGATIVE
SPECIFIC GRAVITY, URINE: 1.019 (ref 1.005–1.030)
pH: 5 (ref 5.0–8.0)

## 2016-07-26 MED ORDER — CEPHALEXIN 500 MG PO CAPS: 500.0000 mg | ORAL_CAPSULE | Freq: Four times a day (QID) | ORAL | 0 refills | Status: DC

## 2016-07-26 MED ORDER — SODIUM CHLORIDE 0.9 % IV BOLUS (SEPSIS)
1000.0000 mL | Freq: Once | INTRAVENOUS | Status: AC
  Administered 2016-07-26: 1000 mL via INTRAVENOUS

## 2016-07-26 MED ORDER — ONDANSETRON 4 MG PO TBDP: ORAL_TABLET | ORAL | 0 refills | Status: DC

## 2016-07-26 MED ORDER — CEPHALEXIN 500 MG PO CAPS
500.0000 mg | ORAL_CAPSULE | Freq: Once | ORAL | Status: AC
  Administered 2016-07-26: 500 mg via ORAL
  Filled 2016-07-26: qty 1

## 2016-07-26 MED ORDER — ONDANSETRON HCL 4 MG/2ML IJ SOLN
4.0000 mg | Freq: Once | INTRAMUSCULAR | Status: AC
  Administered 2016-07-26: 4 mg via INTRAVENOUS
  Filled 2016-07-26: qty 2

## 2016-07-26 NOTE — ED Notes (Signed)
In and Out Cath attempted multiple times by multiple caregivers. Unable to cath pt.

## 2016-07-26 NOTE — ED Triage Notes (Signed)
Pt here for re evaluation of URI and evaluation of nausea that is causing a lack of appetite.  Pt denies any sob, vomiting, diarrhea.  LBM 4 days ago.  15lbs weight loss in last month from 300lbs to 284lbs

## 2016-07-26 NOTE — Discharge Instructions (Signed)
Drink plenty of fluids and follow-up with your doctor next week for recheck °

## 2016-07-26 NOTE — ED Provider Notes (Signed)
[facility] DEPT Provider Note   CSN: [id number] Arrival date & time: 07/26/16  1651     History   Chief Complaint Chief Complaint  Patient presents with  . URI  . Nausea    HPI Martha [name] is a 50 y.o. female.  Patient complains of cough and nausea.   The history is provided by the patient. No language interpreter was used.  URI   This is a new problem. The current episode started more than 2 days ago. The problem has not changed since onset.There has been no fever. Associated symptoms include nausea and cough. Pertinent negatives include no chest pain, no abdominal pain, no diarrhea, no congestion, no headaches and no rash. She has tried nothing for the symptoms. The treatment provided no relief.    Past Medical History:  Diagnosis Date  . Acid reflux   . Anemia   . Arthritis   . Bipolar 1 disorder ([place])   . Cervical radiculopathy   . COPD (chronic obstructive pulmonary disease) ([place]) 2014   bronchitis  . Degenerative disc disease, thoracic   . Depression   . Diabetes mellitus   . Hypertension   . Noncompliance with medication regimen   . Obesity (BMI 30-39.9)   . OSA (obstructive sleep apnea)    cpap  . RLS (restless legs syndrome)     Patient Active Problem List   Diagnosis Date Noted  . Acute on chronic renal failure ([place]) 05/25/2016  . Abdominal pain 04/26/2014  . Cholelithiasis 04/26/2014  . Acute renal failure ([place]) 04/26/2014  . Dehydration 04/26/2014  . Hyponatremia 04/26/2014  . Gallstones 04/06/2014  . Uncontrolled diabetes mellitus ([place]) 10/09/2013  . Breast abscess 05/30/2013  . Diabetes mellitus, type 2 ([place]) 05/30/2013  . Hyperglycemia 05/30/2013  . Internal hemorrhoids with other complication 86/76/1950  . Umbilical hernia without mention of obstruction or gangrene 04/15/2013  . Anemia 04/15/2013  . DM 05/13/2010  . Hyperlipidemia 05/13/2010  . OBESITY 05/13/2010  . Depression 05/13/2010  . RESTLESS LEG SYNDROME 05/13/2010  .  Essential hypertension 05/13/2010  . GERD 05/13/2010  . RECTAL BLEEDING 05/13/2010  . Sleep apnea 05/13/2010  . Dysphagia, oropharyngeal phase 05/13/2010    Past Surgical History:  Procedure Laterality Date  . BIOPSY N/A 05/17/2013   Procedure: BIOPSY;  Surgeon: [name], MD;  Location: [facility];  Service: Endoscopy;  Laterality: N/A;  . CHOLECYSTECTOMY N/A 04/27/2014   Procedure: LAPAROSCOPIC CHOLECYSTECTOMY;  Surgeon: [name], MD;  Location: [facility];  Service: General;  Laterality: N/A;  . COLONOSCOPY  06/06/2010   DTO:IZTIWP bleeding secondary to internal hemorrhoids but incomplete evaluation secondary to poor right colon prep/small rectal and sigmoid colon polyps (hyperplastic). PROPOFOL  . COLONOSCOPY  May 2013   Dr. Gilliam/[facility]: 5 mm a descending colon polyp, hyperplastic. Adequate bowel prep.  . ENDOMETRIAL ABLATION    . ESOPHAGOGASTRODUODENOSCOPY  06/06/2010   YKD:XIPJASN erythema and edema of body of stomach, with sessile polypoid lesions. bx benign. no h.pylori  . ESOPHAGOGASTRODUODENOSCOPY (EGD) WITH PROPOFOL N/A 05/17/2013   Dr. Oneida Alar: normal esophagus, moderate nodular gastritis, negative path, empiric Savary dilation  . FLEXIBLE SIGMOIDOSCOPY N/A 05/17/2013   Dr. Oneida Alar: moderate sized internal hemorrhoids  . HEMORRHOID BANDING N/A 05/17/2013   Procedure: HEMORRHOID BANDING;  Surgeon: [name], MD;  Location: [facility];  Service: Endoscopy;  Laterality: N/A;  2 bands placed  . INCISION AND DRAINAGE ABSCESS Left 10/11/2013   Procedure: INCISION AND DRAINAGE AND DEBRIDEMENT LEFT BREAST  ABSCESS;  Surgeon: [name], MD;  Location: [facility];  Service: General;  Laterality: Left;  . IRRIGATION AND DEBRIDEMENT ABSCESS Right 06/01/2013   Procedure: INCISION AND DRAINAGE AND DEBRIDEMENT ABSCESS RIGHT BREAST;  Surgeon: [name], MD;  Location: [facility];  Service: General;  Laterality: Right;  . SAVORY DILATION N/A 05/17/2013   Procedure: SAVORY  DILATION;  Surgeon: [name], MD;  Location: [facility];  Service: Endoscopy;  Laterality: N/A;  14/15/16  . TUBAL LIGATION      OB History    Gravida Para Term Preterm AB Living   2 2 1 1   2    SAB TAB Ectopic Multiple Live Births                   Home Medications    Prior to Admission medications   Medication Sig Start Date End Date Taking? Authorizing Provider  albuterol (PROVENTIL HFA;VENTOLIN HFA) 108 (90 Base) MCG/ACT inhaler Inhale 2 puffs into the lungs every 4 (four) hours as needed for wheezing or shortness of breath. 07/23/16  Yes Francine Graven, DO  albuterol (PROVENTIL) (2.5 MG/3ML) 0.083% nebulizer solution Take 2.5 mg by nebulization every 6 (six) hours as needed for wheezing or shortness of breath.    Yes Historical Provider, MD  ALPRAZolam Duanne Moron) 1 MG tablet Take 1 mg by mouth 4 (four) times daily.     Yes Historical Provider, MD  baclofen (LIORESAL) 20 MG tablet Take 20 mg by mouth 3 (three) times daily as needed for muscle spasms.   Yes Historical Provider, MD  carvedilol (COREG) 12.5 MG tablet Take 1 tablet (12.5 mg total) by mouth 2 (two) times daily with a meal. 05/26/16  Yes Cherene Altes, MD  Exenatide ER (BYDUREON) 2 MG PEN Inject 2 mg into the skin every Friday.    Yes Historical Provider, MD  fluticasone (FLONASE) 50 MCG/ACT nasal spray Place 2 sprays into both nostrils at bedtime.   Yes Historical Provider, MD  furosemide (LASIX) 20 MG tablet Take 20 mg by mouth daily as needed for edema.   Yes Historical Provider, MD  hydrALAZINE (APRESOLINE) 10 MG tablet Take 1 tablet (10 mg total) by mouth every 8 (eight) hours. 05/26/16  Yes Cherene Altes, MD  HYDROcodone-acetaminophen (NORCO/VICODIN) 5-325 MG tablet Take 1 tablet by mouth every 4 (four) hours as needed for moderate pain.   Yes Historical Provider, MD  lisinopril-hydrochlorothiazide (PRINZIDE,ZESTORETIC) 20-12.5 MG tablet Take 1 tablet by mouth daily.   Yes Historical Provider, MD  metFORMIN  (GLUCOPHAGE) 1000 MG tablet Take 1,000 mg by mouth 2 (two) times daily with a meal.   Yes Historical Provider, MD  rOPINIRole (REQUIP) 1 MG tablet Take 1 mg by mouth at bedtime.    Yes Historical Provider, MD  cephALEXin (KEFLEX) 500 MG capsule Take 1 capsule (500 mg total) by mouth 4 (four) times daily. 07/26/16   Milton Ferguson, MD  ondansetron (ZOFRAN ODT) 4 MG disintegrating tablet 4mg  ODT q4 hours prn nausea/vomit 07/26/16   Milton Ferguson, MD    Family History Family History  Problem Relation Age of Onset  . Adopted: Yes  . Family history unknown: Yes    Social History Social History  Substance Use Topics  . Smoking status: Former Smoker    Packs/day: 1.50    Years: 25.00    Types: Cigarettes    Quit date: 10/23/2007  . Smokeless tobacco: Former Systems developer     Comment: Quit x 6 years ago  .  Alcohol use No     Allergies   Codeine   Review of Systems Review of Systems  Constitutional: Negative for appetite change and fatigue.  HENT: Negative for congestion, ear discharge and sinus pressure.   Eyes: Negative for discharge.  Respiratory: Positive for cough.   Cardiovascular: Negative for chest pain.  Gastrointestinal: Positive for nausea. Negative for abdominal pain and diarrhea.  Genitourinary: Negative for frequency and hematuria.  Musculoskeletal: Negative for back pain.  Skin: Negative for rash.  Neurological: Negative for seizures and headaches.  Psychiatric/Behavioral: Negative for hallucinations.     Physical Exam Updated Vital Signs BP 106/58   Pulse 79   Temp 98 F (36.7 C) (Oral)   Resp 16   Wt 284 lb 6.4 oz (129 kg)   SpO2 99%   BMI 43.24 kg/m   Physical Exam  Constitutional: She is oriented to person, place, and time. She appears well-developed.  HENT:  Head: Normocephalic.  Eyes: Conjunctivae and EOM are normal. No scleral icterus.  Neck: Neck supple. No thyromegaly present.  Cardiovascular: Normal rate and regular rhythm.  Exam reveals no gallop  and no friction rub.   No murmur heard. Pulmonary/Chest: No stridor. She has no wheezes. She has no rales. She exhibits no tenderness.  Abdominal: She exhibits no distension. There is no tenderness. There is no rebound.  Musculoskeletal: Normal range of motion. She exhibits no edema.  Lymphadenopathy:    She has no cervical adenopathy.  Neurological: She is oriented to person, place, and time. She exhibits normal muscle tone. Coordination normal.  Skin: No rash noted. No erythema.  Psychiatric: She has a normal mood and affect. Her behavior is normal.     ED Treatments / Results  Labs (all labs ordered are listed, but only abnormal results are displayed) Labs Reviewed  URINALYSIS, ROUTINE W REFLEX MICROSCOPIC - Abnormal; Notable for the following:       Result Value   APPearance CLOUDY (*)    Glucose, UA 50 (*)    Protein, ur >=300 (*)    Bacteria, UA RARE (*)    All other components within normal limits  COMPREHENSIVE METABOLIC PANEL - Abnormal; Notable for the following:    Chloride 112 (*)    CO2 20 (*)    Glucose, Bld 165 (*)    BUN 49 (*)    Creatinine, Ser 4.73 (*)    AST 13 (*)    GFR calc non Af Amer 10 (*)    GFR calc Af Amer 12 (*)    All other components within normal limits  URINE CULTURE  CBC WITH DIFFERENTIAL/PLATELET    EKG  EKG Interpretation None       Radiology Dg Chest 2 View  Result Date: 07/26/2016 CLINICAL DATA:  Shortness of breath. EXAM: CHEST  2 VIEW COMPARISON:  Radiographs of July 23, 2016. FINDINGS: The heart size and mediastinal contours are within normal limits. Both lungs are clear. No pneumothorax or pleural effusion is noted. The visualized skeletal structures are unremarkable. IMPRESSION: No active cardiopulmonary disease. Electronically Signed   By: Marijo Conception, M.D.   On: 07/26/2016 18:14    Procedures Procedures (including critical care time)  Medications Ordered in ED Medications  cephALEXin (KEFLEX) capsule 500 mg  (not administered)  sodium chloride 0.9 % bolus 1,000 mL (0 mLs Intravenous Stopped 07/26/16 2047)  ondansetron (ZOFRAN) injection 4 mg (4 mg Intravenous Given 07/26/16 1943)     Initial Impression / Assessment and Plan / ED  Course  I have reviewed the triage vital signs and the nursing notes.  Pertinent labs & imaging results that were available during my care of the patient were reviewed by me and considered in my medical decision making (see chart for details).  Clinical Course     Urine suggests UTI. Patient will be put on Keflex and Zofran. She also has significant renal insufficiency and already has an appointment with nephrology in a month. Patient will follow-up with her PCP this week. Urine culture pending  Final Clinical Impressions(s) / ED Diagnoses   Final diagnoses:  Acute cystitis without hematuria    New Prescriptions New Prescriptions   CEPHALEXIN (KEFLEX) 500 MG CAPSULE    Take 1 capsule (500 mg total) by mouth 4 (four) times daily.   ONDANSETRON (ZOFRAN ODT) 4 MG DISINTEGRATING TABLET    4mg  ODT q4 hours prn nausea/vomit     Milton Ferguson, MD 07/26/16 2125

## 2016-07-29 LAB — URINE CULTURE

## 2016-09-05 ENCOUNTER — Encounter (HOSPITAL_COMMUNITY): Payer: Self-pay | Admitting: Emergency Medicine

## 2016-09-05 ENCOUNTER — Emergency Department (HOSPITAL_COMMUNITY)
Admission: EM | Admit: 2016-09-05 | Discharge: 2016-09-05 | Disposition: A | Discharge status: Home / Self Care | Payer: Medicaid Other | Attending: Emergency Medicine | Admitting: Emergency Medicine

## 2016-09-05 DIAGNOSIS — I10 Essential (primary) hypertension: Secondary | ICD-10-CM | POA: Diagnosis not present

## 2016-09-05 DIAGNOSIS — Z87891 Personal history of nicotine dependence: Secondary | ICD-10-CM | POA: Insufficient documentation

## 2016-09-05 DIAGNOSIS — E119 Type 2 diabetes mellitus without complications: Secondary | ICD-10-CM | POA: Diagnosis not present

## 2016-09-05 DIAGNOSIS — Z79899 Other long term (current) drug therapy: Secondary | ICD-10-CM | POA: Insufficient documentation

## 2016-09-05 DIAGNOSIS — J449 Chronic obstructive pulmonary disease, unspecified: Secondary | ICD-10-CM | POA: Diagnosis not present

## 2016-09-05 DIAGNOSIS — L0201 Cutaneous abscess of face: Secondary | ICD-10-CM | POA: Diagnosis not present

## 2016-09-05 LAB — CBC WITH DIFFERENTIAL/PLATELET
BASOS ABS: 0.1 10*3/uL (ref 0.0–0.1)
Basophils Relative: 1 %
EOS PCT: 4 %
Eosinophils Absolute: 0.2 10*3/uL (ref 0.0–0.7)
HEMATOCRIT: 36.6 % (ref 36.0–46.0)
Hemoglobin: 12.3 g/dL (ref 12.0–15.0)
LYMPHS PCT: 33 %
Lymphs Abs: 2.2 10*3/uL (ref 0.7–4.0)
MCH: 28.4 pg (ref 26.0–34.0)
MCHC: 33.6 g/dL (ref 30.0–36.0)
MCV: 84.5 fL (ref 78.0–100.0)
MONO ABS: 0.4 10*3/uL (ref 0.1–1.0)
MONOS PCT: 6 %
NEUTROS ABS: 3.8 10*3/uL (ref 1.7–7.7)
Neutrophils Relative %: 56 %
PLATELETS: 216 10*3/uL (ref 150–400)
RBC: 4.33 MIL/uL (ref 3.87–5.11)
RDW: 13.7 % (ref 11.5–15.5)
WBC: 6.7 10*3/uL (ref 4.0–10.5)

## 2016-09-05 LAB — BASIC METABOLIC PANEL
Anion gap: 9 (ref 5–15)
BUN: 43 mg/dL — AB (ref 6–20)
CALCIUM: 9.9 mg/dL (ref 8.9–10.3)
CO2: 26 mmol/L (ref 22–32)
Chloride: 107 mmol/L (ref 101–111)
Creatinine, Ser: 3.95 mg/dL — ABNORMAL HIGH (ref 0.44–1.00)
GFR calc Af Amer: 14 mL/min — ABNORMAL LOW (ref >60)
GFR, EST NON AFRICAN AMERICAN: 12 mL/min — AB (ref >60)
GLUCOSE: 206 mg/dL — AB (ref 65–99)
Potassium: 4.3 mmol/L (ref 3.5–5.1)
Sodium: 142 mmol/L (ref 135–145)

## 2016-09-05 MED ORDER — POVIDONE-IODINE 10 % EX SOLN
CUTANEOUS | Status: AC
  Filled 2016-09-05: qty 118

## 2016-09-05 MED ORDER — CLINDAMYCIN PHOSPHATE 600 MG/50ML IV SOLN
600.0000 mg | Freq: Once | INTRAVENOUS | Status: AC
  Administered 2016-09-05: 600 mg via INTRAVENOUS
  Filled 2016-09-05: qty 50

## 2016-09-05 MED ORDER — LIDOCAINE HCL (PF) 2 % IJ SOLN
10.0000 mL | Freq: Once | INTRAMUSCULAR | Status: DC
  Filled 2016-09-05: qty 10

## 2016-09-05 MED ORDER — HYDROCODONE-ACETAMINOPHEN 5-325 MG PO TABS
1.0000 | ORAL_TABLET | Freq: Once | ORAL | Status: AC
  Administered 2016-09-05: 1 via ORAL
  Filled 2016-09-05: qty 1

## 2016-09-05 MED ORDER — CLINDAMYCIN HCL 300 MG PO CAPS: 300.0000 mg | ORAL_CAPSULE | Freq: Four times a day (QID) | ORAL | 0 refills | Status: DC

## 2016-09-05 NOTE — Discharge Instructions (Signed)
Warm wet compresses on/off to your face.  Return here in 1-2 days for recheck and packing removal.  Return sooner if you notice increased pain, swelling or redness.

## 2016-09-05 NOTE — ED Triage Notes (Signed)
Pt reports that she has had a 2 days history of facial abscess that has not been relieved by fatback, moist heat and potato to her face per pt She is followed by Dr Luan Pulling

## 2016-09-05 NOTE — ED Provider Notes (Signed)
[facility] DEPT Provider Note   CSN: [id number] Arrival date & time: 09/05/16  1812     History   Chief Complaint Chief Complaint  Patient presents with  . Abscess    on L cheek x 2 days  . Cellulitis    to L cheek    HPI Martha [name] is a 51 y.o. female.  HPI   Martha [name] is a 51 y.o. female who presents to the Emergency Department complaining of redness, and painful abscess to the left lower face.  Area has been worsening for 2 days.  She has tried squeezing the area and several other home remedies without relief.  She describes pain with chewing and opening her mouth.  She denies fever, neck pain or swelling, chills or vomiting.    Past Medical History:  Diagnosis Date  . Acid reflux   . Anemia   . Arthritis   . Bipolar 1 disorder ([place])   . Cervical radiculopathy   . COPD (chronic obstructive pulmonary disease) ([place]) 2014   bronchitis  . Degenerative disc disease, thoracic   . Depression   . Diabetes mellitus   . Hypertension   . Noncompliance with medication regimen   . Obesity (BMI 30-39.9)   . OSA (obstructive sleep apnea)    cpap  . RLS (restless legs syndrome)     Patient Active Problem List   Diagnosis Date Noted  . Acute on chronic renal failure ([place]) 05/25/2016  . Abdominal pain 04/26/2014  . Cholelithiasis 04/26/2014  . Acute renal failure ([place]) 04/26/2014  . Dehydration 04/26/2014  . Hyponatremia 04/26/2014  . Gallstones 04/06/2014  . Uncontrolled diabetes mellitus ([place]) 10/09/2013  . Breast abscess 05/30/2013  . Diabetes mellitus, type 2 ([place]) 05/30/2013  . Hyperglycemia 05/30/2013  . Internal hemorrhoids with other complication 95/28/4132  . Umbilical hernia without mention of obstruction or gangrene 04/15/2013  . Anemia 04/15/2013  . DM 05/13/2010  . Hyperlipidemia 05/13/2010  . OBESITY 05/13/2010  . Depression 05/13/2010  . RESTLESS LEG SYNDROME 05/13/2010  . Essential hypertension 05/13/2010  . GERD 05/13/2010  . RECTAL  BLEEDING 05/13/2010  . Sleep apnea 05/13/2010  . Dysphagia, oropharyngeal phase 05/13/2010    Past Surgical History:  Procedure Laterality Date  . BIOPSY N/A 05/17/2013   Procedure: BIOPSY;  Surgeon: [name], MD;  Location: [facility];  Service: Endoscopy;  Laterality: N/A;  . CHOLECYSTECTOMY N/A 04/27/2014   Procedure: LAPAROSCOPIC CHOLECYSTECTOMY;  Surgeon: [name], MD;  Location: [facility];  Service: General;  Laterality: N/A;  . COLONOSCOPY  06/06/2010   GMW:NUUVOZ bleeding secondary to internal hemorrhoids but incomplete evaluation secondary to poor right colon prep/small rectal and sigmoid colon polyps (hyperplastic). PROPOFOL  . COLONOSCOPY  May 2013   Dr. Gilliam/[facility]: 5 mm a descending colon polyp, hyperplastic. Adequate bowel prep.  . ENDOMETRIAL ABLATION    . ESOPHAGOGASTRODUODENOSCOPY  06/06/2010   DGU:[id number] erythema and edema of body of stomach, with sessile polypoid lesions. bx benign. no h.pylori  . ESOPHAGOGASTRODUODENOSCOPY (EGD) WITH PROPOFOL N/A 05/17/2013   Dr. Oneida Alar: normal esophagus, moderate nodular gastritis, negative path, empiric Savary dilation  . FLEXIBLE SIGMOIDOSCOPY N/A 05/17/2013   Dr. Oneida Alar: moderate sized internal hemorrhoids  . HEMORRHOID BANDING N/A 05/17/2013   Procedure: HEMORRHOID BANDING;  Surgeon: [name], MD;  Location: [facility];  Service: Endoscopy;  Laterality: N/A;  2 bands placed  . INCISION AND DRAINAGE ABSCESS Left 10/11/2013   Procedure: INCISION AND DRAINAGE AND DEBRIDEMENT LEFT BREAST  ABSCESS;  Surgeon: [name], MD;  Location: [facility];  Service: General;  Laterality: Left;  . IRRIGATION AND DEBRIDEMENT ABSCESS Right 06/01/2013   Procedure: INCISION AND DRAINAGE AND DEBRIDEMENT ABSCESS RIGHT BREAST;  Surgeon: [name], MD;  Location: [facility];  Service: General;  Laterality: Right;  . SAVORY DILATION N/A 05/17/2013   Procedure: SAVORY DILATION;  Surgeon: [name], MD;  Location: [facility];  Service:  Endoscopy;  Laterality: N/A;  14/15/16  . TUBAL LIGATION      OB History    Gravida Para Term Preterm AB Living   2 2 1 1   2    SAB TAB Ectopic Multiple Live Births                   Home Medications    Prior to Admission medications   Medication Sig Start Date End Date Taking? Authorizing Provider  albuterol (PROVENTIL HFA;VENTOLIN HFA) 108 (90 Base) MCG/ACT inhaler Inhale 2 puffs into the lungs every 4 (four) hours as needed for wheezing or shortness of breath. 07/23/16  Yes Francine Graven, DO  albuterol (PROVENTIL) (2.5 MG/3ML) 0.083% nebulizer solution Take 2.5 mg by nebulization every 6 (six) hours as needed for wheezing or shortness of breath.    Yes Historical Provider, MD  ALPRAZolam Duanne Moron) 1 MG tablet Take 1 mg by mouth 4 (four) times daily.     Yes Historical Provider, MD  baclofen (LIORESAL) 20 MG tablet Take 20 mg by mouth 3 (three) times daily as needed for muscle spasms.   Yes Historical Provider, MD  carvedilol (COREG) 12.5 MG tablet Take 1 tablet (12.5 mg total) by mouth 2 (two) times daily with a meal. 05/26/16  Yes Cherene Altes, MD  Exenatide ER (BYDUREON) 2 MG PEN Inject 2 mg into the skin every Friday.    Yes Historical Provider, MD  fluticasone (FLONASE) 50 MCG/ACT nasal spray Place 2 sprays into both nostrils at bedtime.   Yes Historical Provider, MD  furosemide (LASIX) 20 MG tablet Take 20 mg by mouth daily as needed for edema.   Yes Historical Provider, MD  hydrALAZINE (APRESOLINE) 10 MG tablet Take 1 tablet (10 mg total) by mouth every 8 (eight) hours. 05/26/16  Yes Cherene Altes, MD  HYDROcodone-acetaminophen (NORCO/VICODIN) 5-325 MG tablet Take 1 tablet by mouth every 4 (four) hours as needed for moderate pain.   Yes Historical Provider, MD  lisinopril-hydrochlorothiazide (PRINZIDE,ZESTORETIC) 20-12.5 MG tablet Take 1 tablet by mouth daily.   Yes Historical Provider, MD  rOPINIRole (REQUIP) 1 MG tablet Take 1 mg by mouth at bedtime.    Yes Historical  Provider, MD  cephALEXin (KEFLEX) 500 MG capsule Take 1 capsule (500 mg total) by mouth 4 (four) times daily. Patient not taking: Reported on 09/05/2016 07/26/16   Milton Ferguson, MD  clindamycin (CLEOCIN) 300 MG capsule Take 1 capsule (300 mg total) by mouth 4 (four) times daily. For 7 days 09/05/16   Kem Parkinson, PA-C    Family History Family History  Problem Relation Age of Onset  . Adopted: Yes  . Family history unknown: Yes    Social History Social History  Substance Use Topics  . Smoking status: Former Smoker    Packs/day: 1.50    Years: 25.00    Types: Cigarettes    Quit date: 10/23/2007  . Smokeless tobacco: Former Systems developer     Comment: Quit x 6 years ago  . Alcohol use No     Allergies  Codeine   Review of Systems Review of Systems  Constitutional: Negative for chills and fever.  HENT: Positive for facial swelling.   Respiratory: Negative for cough.   Gastrointestinal: Negative for abdominal pain, nausea and vomiting.  Musculoskeletal: Negative for arthralgias and joint swelling.  Skin: Negative for color change.  Neurological: Negative for headaches.  Hematological: Negative for adenopathy.  All other systems reviewed and are negative.    Physical Exam Updated Vital Signs BP 140/93 (BP Location: Left Arm)   Pulse 98   Temp 98.1 F (36.7 C) (Oral)   Resp 18   Ht 5\' 8"  (1.727 m)   Wt 130.6 kg   SpO2 100%   BMI 43.79 kg/m   Physical Exam  Constitutional: She is oriented to person, place, and time. She appears well-developed and well-nourished. No distress.  HENT:  Head: Normocephalic and atraumatic.    Mouth/Throat: Oropharynx is clear and moist.  4 cm area of induration and erythema of the left lower face.  No drainage.  Eyes: EOM are normal. Pupils are equal, round, and reactive to light.  Neck: Normal range of motion. Neck supple.  Cardiovascular: Normal rate, regular rhythm and normal heart sounds.   No murmur heard. Pulmonary/Chest: Effort  normal and breath sounds normal. No respiratory distress.  Musculoskeletal: Normal range of motion.  Lymphadenopathy:    She has no cervical adenopathy.  Neurological: She is alert and oriented to person, place, and time. She exhibits normal muscle tone. Coordination normal.  Skin: Skin is warm and dry. No erythema.     Nursing note and vitals reviewed.    ED Treatments / Results  Labs (all labs ordered are listed, but only abnormal results are displayed) Labs Reviewed  BASIC METABOLIC PANEL - Abnormal; Notable for the following:       Result Value   Glucose, Bld 206 (*)    BUN 43 (*)    Creatinine, Ser 3.95 (*)    GFR calc non Af Amer 12 (*)    GFR calc Af Amer 14 (*)    All other components within normal limits  CBC WITH DIFFERENTIAL/PLATELET    EKG  EKG Interpretation None       Radiology No results found.  Procedures Procedures (including critical care time)   INCISION AND DRAINAGE Performed by: Hale Bogus. Consent: Verbal consent obtained. Risks and benefits: risks, benefits and alternatives were discussed Type: abscess  Body area: left lower face  Anesthesia: local infiltration  Incision was made with a #11 scalpel.  Local anesthetic: lidocaine 2 % w/o epinephrine  Anesthetic total: 2 ml  Complexity: complex  Blunt dissection to break up loculations  Drainage: purulent  Drainage amount: small  Packing material: 1/4 in iodoform gauze  Patient tolerance: Patient tolerated the procedure well with no immediate complications.    Medications Ordered in ED Medications  lidocaine (XYLOCAINE) 2 % injection 10 mL (not administered)  povidone-iodine (BETADINE) 10 % external solution (not administered)  clindamycin (CLEOCIN) IVPB 600 mg (600 mg Intravenous New Bag/Given 09/05/16 2110)  HYDROcodone-acetaminophen (NORCO/VICODIN) 5-325 MG per tablet 1 tablet (1 tablet Oral Given 09/05/16 2110)     Initial Impression / Assessment and Plan / ED  Course  I have reviewed the triage vital signs and the nursing notes.  Pertinent labs & imaging results that were available during my care of the patient were reviewed by me and considered in my medical decision making (see chart for details).     Pt is non-toxic appearing.  Abscess to left lower face.  I&D performed.  Airway patent.  Tolerating po fluids w/o difficulty.    Pt has hx of acute on chronic renal failure.  BUN and creat today slightly improved from previous.  Remaining labs are reassuring.  Given IV clinda here and RX written.  She appears stable for d/c and advised to return here in 1-2 days for recheck of the site and packing removal.  Pt agrees to plan.     Final Clinical Impressions(s) / ED Diagnoses   Final diagnoses:  Abscess of face    New Prescriptions New Prescriptions   CLINDAMYCIN (CLEOCIN) 300 MG CAPSULE    Take 1 capsule (300 mg total) by mouth 4 (four) times daily. For 7 days     Kem Parkinson, Hershal Coria 09/07/16 2006    Fredia Sorrow, MD 09/08/16 Laureen Abrahams

## 2016-09-07 ENCOUNTER — Emergency Department (HOSPITAL_COMMUNITY)
Admission: EM | Admit: 2016-09-07 | Discharge: 2016-09-07 | Disposition: A | Discharge status: Home / Self Care | Payer: Medicaid Other | Attending: Emergency Medicine | Admitting: Emergency Medicine

## 2016-09-07 ENCOUNTER — Encounter (HOSPITAL_COMMUNITY): Payer: Self-pay | Admitting: Emergency Medicine

## 2016-09-07 DIAGNOSIS — Z87891 Personal history of nicotine dependence: Secondary | ICD-10-CM | POA: Diagnosis not present

## 2016-09-07 DIAGNOSIS — E119 Type 2 diabetes mellitus without complications: Secondary | ICD-10-CM | POA: Insufficient documentation

## 2016-09-07 DIAGNOSIS — Z79899 Other long term (current) drug therapy: Secondary | ICD-10-CM | POA: Insufficient documentation

## 2016-09-07 DIAGNOSIS — J449 Chronic obstructive pulmonary disease, unspecified: Secondary | ICD-10-CM | POA: Diagnosis not present

## 2016-09-07 DIAGNOSIS — L0201 Cutaneous abscess of face: Secondary | ICD-10-CM | POA: Insufficient documentation

## 2016-09-07 DIAGNOSIS — I10 Essential (primary) hypertension: Secondary | ICD-10-CM | POA: Diagnosis not present

## 2016-09-07 NOTE — ED Provider Notes (Signed)
[facility] DEPT Provider Note   CSN: [id number] Arrival date & time: 09/07/16  4259   By signing my name below, I, Martha [name], attest that this documentation has been prepared under the direction and in the presence of Nat Christen, MD. Electronically Signed: Hilbert [name], Scribe. 09/07/16. 12:49 PM. History   Chief Complaint Chief Complaint  Patient presents with  . Recurrent Skin Infections     The history is provided by the patient. No language interpreter was used.    HPI Comments: Martha [name] is a 51 y.o. female who presents to the Emergency Department to remove packing from a skin infection after being seen 2 days ago. The patient was seen at that time and had incision and drainage performed on her left chin area. She reports some leaking from her incision. She reports no pain. She states that she has changed her dressing 2 times but the packing hasn't been changed. She has been taking her antibiotics.   Past Medical History:  Diagnosis Date  . Acid reflux   . Anemia   . Arthritis   . Bipolar 1 disorder ([place])   . Cervical radiculopathy   . COPD (chronic obstructive pulmonary disease) ([place]) 2014   bronchitis  . Degenerative disc disease, thoracic   . Depression   . Diabetes mellitus   . Hypertension   . Noncompliance with medication regimen   . Obesity (BMI 30-39.9)   . OSA (obstructive sleep apnea)    cpap  . RLS (restless legs syndrome)     Patient Active Problem List   Diagnosis Date Noted  . Acute on chronic renal failure ([place]) 05/25/2016  . Abdominal pain 04/26/2014  . Cholelithiasis 04/26/2014  . Acute renal failure ([place]) 04/26/2014  . Dehydration 04/26/2014  . Hyponatremia 04/26/2014  . Gallstones 04/06/2014  . Uncontrolled diabetes mellitus ([place]) 10/09/2013  . Breast abscess 05/30/2013  . Diabetes mellitus, type 2 ([place]) 05/30/2013  . Hyperglycemia 05/30/2013  . Internal hemorrhoids with other complication 56/38/7564  . Umbilical hernia  without mention of obstruction or gangrene 04/15/2013  . Anemia 04/15/2013  . DM 05/13/2010  . Hyperlipidemia 05/13/2010  . OBESITY 05/13/2010  . Depression 05/13/2010  . RESTLESS LEG SYNDROME 05/13/2010  . Essential hypertension 05/13/2010  . GERD 05/13/2010  . RECTAL BLEEDING 05/13/2010  . Sleep apnea 05/13/2010  . Dysphagia, oropharyngeal phase 05/13/2010    Past Surgical History:  Procedure Laterality Date  . BIOPSY N/A 05/17/2013   Procedure: BIOPSY;  Surgeon: [name], MD;  Location: [facility];  Service: Endoscopy;  Laterality: N/A;  . CHOLECYSTECTOMY N/A 04/27/2014   Procedure: LAPAROSCOPIC CHOLECYSTECTOMY;  Surgeon: [name], MD;  Location: [facility];  Service: General;  Laterality: N/A;  . COLONOSCOPY  06/06/2010   PPI:RJJOAC bleeding secondary to internal hemorrhoids but incomplete evaluation secondary to poor right colon prep/small rectal and sigmoid colon polyps (hyperplastic). PROPOFOL  . COLONOSCOPY  May 2013   Dr. Gilliam/[facility]: 5 mm a descending colon polyp, hyperplastic. Adequate bowel prep.  . ENDOMETRIAL ABLATION    . ESOPHAGOGASTRODUODENOSCOPY  06/06/2010   ZYS:AYTKZSW erythema and edema of body of stomach, with sessile polypoid lesions. bx benign. no h.pylori  . ESOPHAGOGASTRODUODENOSCOPY (EGD) WITH PROPOFOL N/A 05/17/2013   Dr. Oneida Alar: normal esophagus, moderate nodular gastritis, negative path, empiric Savary dilation  . FLEXIBLE SIGMOIDOSCOPY N/A 05/17/2013   Dr. Oneida Alar: moderate sized internal hemorrhoids  . HEMORRHOID BANDING N/A 05/17/2013   Procedure: HEMORRHOID BANDING;  Surgeon: [name], MD;  Location: [facility];  Service: Endoscopy;  Laterality: N/A;  2 bands placed  . INCISION AND DRAINAGE ABSCESS Left 10/11/2013   Procedure: INCISION AND DRAINAGE AND DEBRIDEMENT LEFT BREAST  ABSCESS;  Surgeon: [name], MD;  Location: [facility];  Service: General;  Laterality: Left;  . IRRIGATION AND DEBRIDEMENT ABSCESS Right 06/01/2013    Procedure: INCISION AND DRAINAGE AND DEBRIDEMENT ABSCESS RIGHT BREAST;  Surgeon: [name], MD;  Location: [facility];  Service: General;  Laterality: Right;  . SAVORY DILATION N/A 05/17/2013   Procedure: SAVORY DILATION;  Surgeon: [name], MD;  Location: [facility];  Service: Endoscopy;  Laterality: N/A;  14/15/16  . TUBAL LIGATION      OB History    Gravida Para Term Preterm AB Living   2 2 1 1   2    SAB TAB Ectopic Multiple Live Births                   Home Medications    Prior to Admission medications   Medication Sig Start Date End Date Taking? Authorizing Provider  albuterol (PROVENTIL HFA;VENTOLIN HFA) 108 (90 Base) MCG/ACT inhaler Inhale 2 puffs into the lungs every 4 (four) hours as needed for wheezing or shortness of breath. 07/23/16  Yes Francine Graven, DO  albuterol (PROVENTIL) (2.5 MG/3ML) 0.083% nebulizer solution Take 2.5 mg by nebulization every 6 (six) hours as needed for wheezing or shortness of breath.    Yes Historical Provider, MD  ALPRAZolam Duanne Moron) 1 MG tablet Take 1 mg by mouth 4 (four) times daily.     Yes Historical Provider, MD  baclofen (LIORESAL) 20 MG tablet Take 20 mg by mouth 3 (three) times daily as needed for muscle spasms.   Yes Historical Provider, MD  carvedilol (COREG) 12.5 MG tablet Take 1 tablet (12.5 mg total) by mouth 2 (two) times daily with a meal. 05/26/16  Yes Cherene Altes, MD  clindamycin (CLEOCIN) 300 MG capsule Take 1 capsule (300 mg total) by mouth 4 (four) times daily. For 7 days 09/05/16  Yes Tammy Triplett, PA-C  Exenatide ER (BYDUREON) 2 MG PEN Inject 2 mg into the skin every Friday.    Yes Historical Provider, MD  fluticasone (FLONASE) 50 MCG/ACT nasal spray Place 2 sprays into both nostrils at bedtime.   Yes Historical Provider, MD  furosemide (LASIX) 20 MG tablet Take 20 mg by mouth daily as needed for edema.   Yes Historical Provider, MD  hydrALAZINE (APRESOLINE) 10 MG tablet Take 1 tablet (10 mg total) by mouth every  8 (eight) hours. 05/26/16  Yes Cherene Altes, MD  HYDROcodone-acetaminophen (NORCO/VICODIN) 5-325 MG tablet Take 1 tablet by mouth every 4 (four) hours as needed for moderate pain.   Yes Historical Provider, MD  lisinopril-hydrochlorothiazide (PRINZIDE,ZESTORETIC) 20-12.5 MG tablet Take 1 tablet by mouth daily.   Yes Historical Provider, MD  rOPINIRole (REQUIP) 1 MG tablet Take 1 mg by mouth at bedtime.    Yes Historical Provider, MD  cephALEXin (KEFLEX) 500 MG capsule Take 1 capsule (500 mg total) by mouth 4 (four) times daily. Patient not taking: Reported on 09/05/2016 07/26/16   Milton Ferguson, MD    Family History Family History  Problem Relation Age of Onset  . Adopted: Yes  . Family history unknown: Yes    Social History Social History  Substance Use Topics  . Smoking status: Former Smoker    Packs/day: 1.50    Years: 25.00    Types: Cigarettes  Quit date: 10/23/2007  . Smokeless tobacco: Former Systems developer     Comment: Quit x 6 years ago  . Alcohol use No     Allergies   Codeine   Review of Systems Review of Systems A complete 10 system review of systems was obtained and all systems are negative except as noted in the HPI and PMH.    Physical Exam Updated Vital Signs BP (!) 156/118 (BP Location: Left Arm) Comment: pt states that she has not had her BP med yet today  Pulse 90   Temp 98.6 F (37 C) (Oral)   Resp 18   Ht 5\' 8"  (1.727 m)   Wt 288 lb (130.6 kg)   SpO2 96%   BMI 43.79 kg/m   Physical Exam  Constitutional: She is oriented to person, place, and time. She appears well-developed and well-nourished.  HENT:  Left chin has an area of induration 2cm in diameter. I probed the area with some tweezers and didn't find any packing.  Eyes: Conjunctivae are normal.  Neck: Neck supple.  Musculoskeletal: Normal range of motion.  Neurological: She is alert and oriented to person, place, and time.  Skin: Skin is warm and dry.  Psychiatric: She has a normal mood  and affect. Her behavior is normal.  Nursing note and vitals reviewed.    ED Treatments / Results  DIAGNOSTIC STUDIES: Oxygen Saturation is 98% on RA, normal by my interpretation.    COORDINATION OF CARE: 12:30 PM Discussed treatment plan with pt at bedside, which includes continuing her antibiotics, and pt agreed to plan.  Labs (all labs ordered are listed, but only abnormal results are displayed) Labs Reviewed - No data to display  EKG  EKG Interpretation None       Radiology No results found.  Procedures Procedures (including critical care time)  Medications Ordered in ED Medications - No data to display   Initial Impression / Assessment and Plan / ED Course  I have reviewed the triage vital signs and the nursing notes.  Pertinent labs & imaging results that were available during my care of the patient were reviewed by me and considered in my medical decision making (see chart for details).     Patient states wound is feeling better. I was unable to locate any packing. Will continue antibiotic. Patient understands to return if worse.  Final Clinical Impressions(s) / ED Diagnoses   Final diagnoses:  Abscess of face    New Prescriptions New Prescriptions   No medications on file   I personally performed the services described in this documentation, which was scribed in my presence. The recorded information has been reviewed and is accurate.     Nat Christen, MD 09/07/16 [phone]

## 2016-09-07 NOTE — Discharge Instructions (Signed)
You can shower. Simple dressing. Continue antibiotic. Return if worse in anyway. I did not see any packing material in the wound.

## 2016-09-07 NOTE — ED Triage Notes (Signed)
Patient presents with abscess to left face. Patient seen on Friday and had packing placed. Pt presents to have packing removed.

## 2016-09-11 ENCOUNTER — Other Ambulatory Visit (HOSPITAL_COMMUNITY): Payer: Self-pay | Admitting: Medical

## 2016-09-11 DIAGNOSIS — N183 Chronic kidney disease, stage 3 unspecified: Secondary | ICD-10-CM

## 2016-09-23 ENCOUNTER — Ambulatory Visit (HOSPITAL_COMMUNITY)
Admission: RE | Admit: 2016-09-23 | Discharge: 2016-09-23 | Disposition: A | Discharge status: Home / Self Care | Payer: Medicaid Other | Source: Ambulatory Visit | Attending: Medical | Admitting: Medical

## 2016-09-23 DIAGNOSIS — N183 Chronic kidney disease, stage 3 unspecified: Secondary | ICD-10-CM

## 2016-09-23 DIAGNOSIS — N281 Cyst of kidney, acquired: Secondary | ICD-10-CM | POA: Insufficient documentation

## 2016-10-01 ENCOUNTER — Emergency Department (HOSPITAL_COMMUNITY)
Admission: EM | Admit: 2016-10-01 | Discharge: 2016-10-01 | Disposition: A | Discharge status: Home / Self Care | Payer: Medicaid Other | Attending: Emergency Medicine | Admitting: Emergency Medicine

## 2016-10-01 ENCOUNTER — Encounter (HOSPITAL_COMMUNITY): Payer: Self-pay

## 2016-10-01 DIAGNOSIS — E119 Type 2 diabetes mellitus without complications: Secondary | ICD-10-CM | POA: Diagnosis not present

## 2016-10-01 DIAGNOSIS — J449 Chronic obstructive pulmonary disease, unspecified: Secondary | ICD-10-CM | POA: Diagnosis not present

## 2016-10-01 DIAGNOSIS — I10 Essential (primary) hypertension: Secondary | ICD-10-CM | POA: Insufficient documentation

## 2016-10-01 DIAGNOSIS — Z79899 Other long term (current) drug therapy: Secondary | ICD-10-CM | POA: Insufficient documentation

## 2016-10-01 DIAGNOSIS — R51 Headache: Secondary | ICD-10-CM | POA: Insufficient documentation

## 2016-10-01 DIAGNOSIS — Z87891 Personal history of nicotine dependence: Secondary | ICD-10-CM | POA: Diagnosis not present

## 2016-10-01 DIAGNOSIS — R519 Headache, unspecified: Secondary | ICD-10-CM

## 2016-10-01 MED ORDER — SODIUM CHLORIDE 0.9 % IV BOLUS (SEPSIS)
1000.0000 mL | Freq: Once | INTRAVENOUS | Status: AC
  Administered 2016-10-01: 1000 mL via INTRAVENOUS

## 2016-10-01 MED ORDER — DIPHENHYDRAMINE HCL 50 MG/ML IJ SOLN
25.0000 mg | Freq: Once | INTRAMUSCULAR | Status: AC
Start: 2016-10-01 — End: 2016-10-01
  Administered 2016-10-01: 25 mg via INTRAVENOUS
  Filled 2016-10-01: qty 1

## 2016-10-01 MED ORDER — METOCLOPRAMIDE HCL 5 MG/ML IJ SOLN
10.0000 mg | Freq: Once | INTRAMUSCULAR | Status: AC
  Administered 2016-10-01: 10 mg via INTRAVENOUS
  Filled 2016-10-01: qty 2

## 2016-10-01 MED ORDER — METOCLOPRAMIDE HCL 10 MG PO TABS: 10.0000 mg | ORAL_TABLET | Freq: Four times a day (QID) | ORAL | 0 refills | Status: DC | PRN

## 2016-10-01 NOTE — ED Notes (Signed)
Pt alert & oriented x4. Patient given discharge instructions, paperwork & prescription(s). Patient verbalized understanding. Pt left department in wheelchair escorted by staff. Pt left w/ no further questions. 

## 2016-10-01 NOTE — ED Triage Notes (Signed)
It feels like someone is stabbing me in the back of my head.  It woke me up this morning.  Also having pain in my head.  Patient denies fever, nausea, vomiting, visual changes.  Patient states that she is having chills.

## 2016-10-01 NOTE — ED Notes (Signed)
Pt states she was woke up about an hour ago w/ a pain to the back of her head. Pt took hydrocodone for pain w/ no relief.

## 2016-10-01 NOTE — ED Provider Notes (Signed)
[facility] DEPT Provider Note   CSN: [id number] Arrival date & time: 10/01/16  0318     History   Chief Complaint Chief Complaint  Patient presents with  . Headache    HPI Martha [name] is a 51 y.o. female.  She was awoken about 2 hours ago with a sharp occipital headache. Pain is also throbbing. She rates it at 8/10. Noise seems to make it worse. Nothing makes it better. She she denies any visual change, nausea, vomiting. She denies dizziness, weakness, numbness. She had taken hydrocodone prior to going to sleep, but has not taken anything for her pain since it started. She denies having similar headaches previously. She denies fever or chills.   The history is provided by the patient.  Headache      Past Medical History:  Diagnosis Date  . Acid reflux   . Anemia   . Arthritis   . Bipolar 1 disorder ([place])   . Cervical radiculopathy   . COPD (chronic obstructive pulmonary disease) ([place]) 2014   bronchitis  . Degenerative disc disease, thoracic   . Depression   . Diabetes mellitus   . Hypertension   . Noncompliance with medication regimen   . Obesity (BMI 30-39.9)   . OSA (obstructive sleep apnea)    cpap  . RLS (restless legs syndrome)     Patient Active Problem List   Diagnosis Date Noted  . Acute on chronic renal failure ([place]) 05/25/2016  . Abdominal pain 04/26/2014  . Cholelithiasis 04/26/2014  . Acute renal failure ([place]) 04/26/2014  . Dehydration 04/26/2014  . Hyponatremia 04/26/2014  . Gallstones 04/06/2014  . Uncontrolled diabetes mellitus ([place]) 10/09/2013  . Breast abscess 05/30/2013  . Diabetes mellitus, type 2 ([place]) 05/30/2013  . Hyperglycemia 05/30/2013  . Internal hemorrhoids with other complication 62/95/2841  . Umbilical hernia without mention of obstruction or gangrene 04/15/2013  . Anemia 04/15/2013  . DM 05/13/2010  . Hyperlipidemia 05/13/2010  . OBESITY 05/13/2010  . Depression 05/13/2010  . RESTLESS LEG SYNDROME 05/13/2010  .  Essential hypertension 05/13/2010  . GERD 05/13/2010  . RECTAL BLEEDING 05/13/2010  . Sleep apnea 05/13/2010  . Dysphagia, oropharyngeal phase 05/13/2010    Past Surgical History:  Procedure Laterality Date  . BIOPSY N/A 05/17/2013   Procedure: BIOPSY;  Surgeon: [name], MD;  Location: [facility];  Service: Endoscopy;  Laterality: N/A;  . CHOLECYSTECTOMY N/A 04/27/2014   Procedure: LAPAROSCOPIC CHOLECYSTECTOMY;  Surgeon: [name], MD;  Location: [facility];  Service: General;  Laterality: N/A;  . COLONOSCOPY  06/06/2010   LKG:MWNUUV bleeding secondary to internal hemorrhoids but incomplete evaluation secondary to poor right colon prep/small rectal and sigmoid colon polyps (hyperplastic). PROPOFOL  . COLONOSCOPY  May 2013   Dr. Gilliam/[facility]: 5 mm a descending colon polyp, hyperplastic. Adequate bowel prep.  . ENDOMETRIAL ABLATION    . ESOPHAGOGASTRODUODENOSCOPY  06/06/2010   OZD:GUYQIHK erythema and edema of body of stomach, with sessile polypoid lesions. bx benign. no h.pylori  . ESOPHAGOGASTRODUODENOSCOPY (EGD) WITH PROPOFOL N/A 05/17/2013   Dr. Oneida Alar: normal esophagus, moderate nodular gastritis, negative path, empiric Savary dilation  . FLEXIBLE SIGMOIDOSCOPY N/A 05/17/2013   Dr. Oneida Alar: moderate sized internal hemorrhoids  . HEMORRHOID BANDING N/A 05/17/2013   Procedure: HEMORRHOID BANDING;  Surgeon: [name], MD;  Location: [facility];  Service: Endoscopy;  Laterality: N/A;  2 bands placed  . INCISION AND DRAINAGE ABSCESS Left 10/11/2013   Procedure: INCISION AND DRAINAGE AND DEBRIDEMENT LEFT BREAST  ABSCESS;  Surgeon: [name], MD;  Location: [facility];  Service: General;  Laterality: Left;  . IRRIGATION AND DEBRIDEMENT ABSCESS Right 06/01/2013   Procedure: INCISION AND DRAINAGE AND DEBRIDEMENT ABSCESS RIGHT BREAST;  Surgeon: [name], MD;  Location: [facility];  Service: General;  Laterality: Right;  . SAVORY DILATION N/A 05/17/2013   Procedure: SAVORY  DILATION;  Surgeon: [name], MD;  Location: [facility];  Service: Endoscopy;  Laterality: N/A;  14/15/16  . TUBAL LIGATION      OB History    Gravida Para Term Preterm AB Living   2 2 1 1   2    SAB TAB Ectopic Multiple Live Births                   Home Medications    Prior to Admission medications   Medication Sig Start Date End Date Taking? Authorizing Provider  albuterol (PROVENTIL HFA;VENTOLIN HFA) 108 (90 Base) MCG/ACT inhaler Inhale 2 puffs into the lungs every 4 (four) hours as needed for wheezing or shortness of breath. 07/23/16  Yes Francine Graven, DO  albuterol (PROVENTIL) (2.5 MG/3ML) 0.083% nebulizer solution Take 2.5 mg by nebulization every 6 (six) hours as needed for wheezing or shortness of breath.    Yes Historical Provider, MD  ALPRAZolam Duanne Moron) 1 MG tablet Take 1 mg by mouth 4 (four) times daily.     Yes Historical Provider, MD  baclofen (LIORESAL) 20 MG tablet Take 20 mg by mouth 3 (three) times daily as needed for muscle spasms.   Yes Historical Provider, MD  carvedilol (COREG) 12.5 MG tablet Take 1 tablet (12.5 mg total) by mouth 2 (two) times daily with a meal. 05/26/16  Yes Cherene Altes, MD  Exenatide ER (BYDUREON) 2 MG PEN Inject 2 mg into the skin every Friday.    Yes Historical Provider, MD  fluticasone (FLONASE) 50 MCG/ACT nasal spray Place 2 sprays into both nostrils at bedtime.   Yes Historical Provider, MD  furosemide (LASIX) 20 MG tablet Take 20 mg by mouth daily as needed for edema.   Yes Historical Provider, MD  hydrALAZINE (APRESOLINE) 10 MG tablet Take 1 tablet (10 mg total) by mouth every 8 (eight) hours. 05/26/16  Yes Cherene Altes, MD  HYDROcodone-acetaminophen (NORCO/VICODIN) 5-325 MG tablet Take 1 tablet by mouth every 4 (four) hours as needed for moderate pain.   Yes Historical Provider, MD  lisinopril-hydrochlorothiazide (PRINZIDE,ZESTORETIC) 20-12.5 MG tablet Take 1 tablet by mouth daily.   Yes Historical Provider, MD  rOPINIRole  (REQUIP) 1 MG tablet Take 1 mg by mouth at bedtime.    Yes Historical Provider, MD  cephALEXin (KEFLEX) 500 MG capsule Take 1 capsule (500 mg total) by mouth 4 (four) times daily. Patient not taking: Reported on 09/05/2016 07/26/16   Milton Ferguson, MD  clindamycin (CLEOCIN) 300 MG capsule Take 1 capsule (300 mg total) by mouth 4 (four) times daily. For 7 days 09/05/16   Kem Parkinson, PA-C    Family History Family History  Problem Relation Age of Onset  . Adopted: Yes  . Family history unknown: Yes    Social History Social History  Substance Use Topics  . Smoking status: Former Smoker    Packs/day: 1.50    Years: 25.00    Types: Cigarettes    Quit date: 10/23/2007  . Smokeless tobacco: Former Systems developer     Comment: Quit x 6 years ago  . Alcohol use No     Allergies   Codeine  Review of Systems Review of Systems  Neurological: Positive for headaches.  All other systems reviewed and are negative.    Physical Exam Updated Vital Signs BP 149/91 (BP Location: Right Arm)   Pulse 84   Temp 97.9 F (36.6 C) (Oral)   Resp 16   Ht 5\' 8"  (1.727 m)   Wt 288 lb (130.6 kg)   SpO2 98%   BMI 43.79 kg/m   Physical Exam  Nursing note and vitals reviewed.  51 year old female, resting comfortably and in no acute distress. Vital signs are significant for hypertension. Oxygen saturation is 98%, which is normal. Head is normocephalic and atraumatic. PERRLA, EOMI. Oropharynx is clear. Fundi show no hemorrhage, exudate, or papilledema. Neck is tender along the paracervical muscles bilaterally. Neck is supple without adenopathy or JVD. Back is nontender and there is no CVA tenderness. Lungs are clear without rales, wheezes, or rhonchi. Chest is nontender. Heart has regular rate and rhythm without murmur. Abdomen is soft, flat, nontender without masses or hepatosplenomegaly and peristalsis is normoactive. Extremities have no cyanosis or edema, full range of motion is present. Skin is warm  and dry without rash. Neurologic: Mental status is normal, cranial nerves are intact, there are no motor or sensory deficits.  ED Treatments / Results   Procedures Procedures (including critical care time)  Medications Ordered in ED Medications  sodium chloride 0.9 % bolus 1,000 mL (0 mLs Intravenous Stopped 10/01/16 0555)  metoCLOPramide (REGLAN) injection 10 mg (10 mg Intravenous Given 10/01/16 0402)  diphenhydrAMINE (BENADRYL) injection 25 mg (25 mg Intravenous Given 10/01/16 0359)     Initial Impression / Assessment and Plan / ED Course  I have reviewed the triage vital signs and the nursing notes.  Headache with pattern strongly suggestive of muscle contraction headache. Old records reviewed, and she has no relevant past visits. She will be given normal saline, metoclopramide, diphenhydramine and reassessed.  She feels much better after above noted treatment. She is discharged with prescription for metoclopramide.  Final Clinical Impressions(s) / ED Diagnoses   Final diagnoses:  Headache, unspecified headache type    New Prescriptions New Prescriptions   METOCLOPRAMIDE (REGLAN) 10 MG TABLET    Take 1 tablet (10 mg total) by mouth every 6 (six) hours as needed for nausea (or headache).     Delora Fuel, MD 84/21/03 1281

## 2016-10-15 ENCOUNTER — Emergency Department (HOSPITAL_COMMUNITY)
Admission: EM | Admit: 2016-10-15 | Discharge: 2016-10-16 | Disposition: A | Discharge status: Home / Self Care | Payer: Medicaid Other | Attending: Emergency Medicine | Admitting: Emergency Medicine

## 2016-10-15 ENCOUNTER — Encounter (HOSPITAL_COMMUNITY): Payer: Self-pay | Admitting: *Deleted

## 2016-10-15 DIAGNOSIS — I10 Essential (primary) hypertension: Secondary | ICD-10-CM | POA: Diagnosis not present

## 2016-10-15 DIAGNOSIS — E119 Type 2 diabetes mellitus without complications: Secondary | ICD-10-CM | POA: Insufficient documentation

## 2016-10-15 DIAGNOSIS — Z79899 Other long term (current) drug therapy: Secondary | ICD-10-CM | POA: Diagnosis not present

## 2016-10-15 DIAGNOSIS — Z87891 Personal history of nicotine dependence: Secondary | ICD-10-CM | POA: Diagnosis not present

## 2016-10-15 DIAGNOSIS — R51 Headache: Secondary | ICD-10-CM | POA: Diagnosis not present

## 2016-10-15 DIAGNOSIS — R519 Headache, unspecified: Secondary | ICD-10-CM

## 2016-10-15 DIAGNOSIS — J449 Chronic obstructive pulmonary disease, unspecified: Secondary | ICD-10-CM | POA: Diagnosis not present

## 2016-10-15 LAB — BASIC METABOLIC PANEL
Anion gap: 8 (ref 5–15)
BUN: 62 mg/dL — ABNORMAL HIGH (ref 6–20)
CHLORIDE: 111 mmol/L (ref 101–111)
CO2: 22 mmol/L (ref 22–32)
CREATININE: 4.08 mg/dL — AB (ref 0.44–1.00)
Calcium: 9.4 mg/dL (ref 8.9–10.3)
GFR calc non Af Amer: 12 mL/min — ABNORMAL LOW (ref >60)
GFR, EST AFRICAN AMERICAN: 14 mL/min — AB (ref >60)
Glucose, Bld: 153 mg/dL — ABNORMAL HIGH (ref 65–99)
Potassium: 4.5 mmol/L (ref 3.5–5.1)
Sodium: 141 mmol/L (ref 135–145)

## 2016-10-15 LAB — CBC WITH DIFFERENTIAL/PLATELET
Basophils Absolute: 0 10*3/uL (ref 0.0–0.1)
Basophils Relative: 1 %
Eosinophils Absolute: 0.5 10*3/uL (ref 0.0–0.7)
Eosinophils Relative: 7 %
HEMATOCRIT: 34.9 % — AB (ref 36.0–46.0)
HEMOGLOBIN: 11.6 g/dL — AB (ref 12.0–15.0)
LYMPHS ABS: 2.1 10*3/uL (ref 0.7–4.0)
Lymphocytes Relative: 32 %
MCH: 28.2 pg (ref 26.0–34.0)
MCHC: 33.2 g/dL (ref 30.0–36.0)
MCV: 84.9 fL (ref 78.0–100.0)
MONOS PCT: 5 %
Monocytes Absolute: 0.3 10*3/uL (ref 0.1–1.0)
NEUTROS ABS: 3.6 10*3/uL (ref 1.7–7.7)
NEUTROS PCT: 55 %
Platelets: 167 10*3/uL (ref 150–400)
RBC: 4.11 MIL/uL (ref 3.87–5.11)
RDW: 14.9 % (ref 11.5–15.5)
WBC: 6.5 10*3/uL (ref 4.0–10.5)

## 2016-10-15 MED ORDER — SODIUM CHLORIDE 0.9 % IV BOLUS (SEPSIS)
1000.0000 mL | Freq: Once | INTRAVENOUS | Status: AC
  Administered 2016-10-15: 1000 mL via INTRAVENOUS

## 2016-10-15 MED ORDER — KETOROLAC TROMETHAMINE 30 MG/ML IJ SOLN
30.0000 mg | Freq: Once | INTRAMUSCULAR | Status: AC
  Administered 2016-10-15: 30 mg via INTRAVENOUS
  Filled 2016-10-15: qty 1

## 2016-10-15 MED ORDER — METOCLOPRAMIDE HCL 5 MG/ML IJ SOLN
10.0000 mg | Freq: Once | INTRAMUSCULAR | Status: AC
  Administered 2016-10-15: 10 mg via INTRAVENOUS
  Filled 2016-10-15: qty 2

## 2016-10-15 MED ORDER — DIPHENHYDRAMINE HCL 50 MG/ML IJ SOLN
25.0000 mg | Freq: Once | INTRAMUSCULAR | Status: AC
  Administered 2016-10-15: 25 mg via INTRAVENOUS
  Filled 2016-10-15: qty 1

## 2016-10-15 NOTE — ED Triage Notes (Signed)
Pt comes in for a headache starting last night. Pt denies any unilateral weakness. Pt has hx of migraines and states this feels the same. Pt pain increased with sound and light.

## 2016-10-15 NOTE — Discharge Instructions (Signed)
Home to rest. Avoid the light and noise.  Follow-up your primary care doctor.

## 2016-10-15 NOTE — ED Provider Notes (Signed)
[facility] DEPT Provider Note   CSN: [id number] Arrival date & time: 10/15/16  1837 By signing my name below, I, Georgette Shell, attest that this documentation has been prepared under the direction and in the presence of Nat Christen, MD. Electronically Signed: Georgette Shell, ED Scribe. 10/15/16. 8:38 PM.  History   Chief Complaint Chief Complaint  Patient presents with  . Headache    HPI The history is provided by the patient. No language interpreter was used.   HPI Comments: Martha [name] is a 51 y.o. female with h/o anemia, DM, HTN, and migraines, who presents to the Emergency Department complaining of occipital headache onset last night with associated cramping neck pain, chills, and photophobia. Pt rates her current pain an 8/10. She has not tried any OTC medications PTA. She has h/o migraines and states this feels similar. Pt denies fever, or any other associated symptoms.   Past Medical History:  Diagnosis Date  . Acid reflux   . Anemia   . Arthritis   . Bipolar 1 disorder ([place])   . Cervical radiculopathy   . COPD (chronic obstructive pulmonary disease) ([place]) 2014   bronchitis  . Degenerative disc disease, thoracic   . Depression   . Diabetes mellitus   . Hypertension   . Noncompliance with medication regimen   . Obesity (BMI 30-39.9)   . OSA (obstructive sleep apnea)    cpap  . RLS (restless legs syndrome)     Patient Active Problem List   Diagnosis Date Noted  . Acute on chronic renal failure ([place]) 05/25/2016  . Abdominal pain 04/26/2014  . Cholelithiasis 04/26/2014  . Acute renal failure ([place]) 04/26/2014  . Dehydration 04/26/2014  . Hyponatremia 04/26/2014  . Gallstones 04/06/2014  . Uncontrolled diabetes mellitus ([place]) 10/09/2013  . Breast abscess 05/30/2013  . Diabetes mellitus, type 2 ([place]) 05/30/2013  . Hyperglycemia 05/30/2013  . Internal hemorrhoids with other complication 27/25/3664  . Umbilical hernia without mention of obstruction or gangrene  04/15/2013  . Anemia 04/15/2013  . DM 05/13/2010  . Hyperlipidemia 05/13/2010  . OBESITY 05/13/2010  . Depression 05/13/2010  . RESTLESS LEG SYNDROME 05/13/2010  . Essential hypertension 05/13/2010  . GERD 05/13/2010  . RECTAL BLEEDING 05/13/2010  . Sleep apnea 05/13/2010  . Dysphagia, oropharyngeal phase 05/13/2010    Past Surgical History:  Procedure Laterality Date  . BIOPSY N/A 05/17/2013   Procedure: BIOPSY;  Surgeon: [name], MD;  Location: [facility];  Service: Endoscopy;  Laterality: N/A;  . CHOLECYSTECTOMY N/A 04/27/2014   Procedure: LAPAROSCOPIC CHOLECYSTECTOMY;  Surgeon: [name], MD;  Location: [facility];  Service: General;  Laterality: N/A;  . COLONOSCOPY  06/06/2010   QIH:[id number] bleeding secondary to internal hemorrhoids but incomplete evaluation secondary to poor right colon prep/small rectal and sigmoid colon polyps (hyperplastic). PROPOFOL  . COLONOSCOPY  May 2013   Dr. Gilliam/[facility]: 5 mm a descending colon polyp, hyperplastic. Adequate bowel prep.  . ENDOMETRIAL ABLATION    . ESOPHAGOGASTRODUODENOSCOPY  06/06/2010   DGL:OVFIEPP erythema and edema of body of stomach, with sessile polypoid lesions. bx benign. no h.pylori  . ESOPHAGOGASTRODUODENOSCOPY (EGD) WITH PROPOFOL N/A 05/17/2013   Dr. Oneida Alar: normal esophagus, moderate nodular gastritis, negative path, empiric Savary dilation  . FLEXIBLE SIGMOIDOSCOPY N/A 05/17/2013   Dr. Oneida Alar: moderate sized internal hemorrhoids  . HEMORRHOID BANDING N/A 05/17/2013   Procedure: HEMORRHOID BANDING;  Surgeon: [name], MD;  Location: [facility];  Service: Endoscopy;  Laterality: N/A;  2 bands placed  .  INCISION AND DRAINAGE ABSCESS Left 10/11/2013   Procedure: INCISION AND DRAINAGE AND DEBRIDEMENT LEFT BREAST  ABSCESS;  Surgeon: [name], MD;  Location: [facility];  Service: General;  Laterality: Left;  . IRRIGATION AND DEBRIDEMENT ABSCESS Right 06/01/2013   Procedure: INCISION AND DRAINAGE AND DEBRIDEMENT  ABSCESS RIGHT BREAST;  Surgeon: [name], MD;  Location: [facility];  Service: General;  Laterality: Right;  . SAVORY DILATION N/A 05/17/2013   Procedure: SAVORY DILATION;  Surgeon: [name], MD;  Location: [facility];  Service: Endoscopy;  Laterality: N/A;  14/15/16  . TUBAL LIGATION      OB History    Gravida Para Term Preterm AB Living   2 2 1 1   2    SAB TAB Ectopic Multiple Live Births                   Home Medications    Prior to Admission medications   Medication Sig Start Date End Date Taking? Authorizing Provider  albuterol (PROVENTIL HFA;VENTOLIN HFA) 108 (90 Base) MCG/ACT inhaler Inhale 2 puffs into the lungs every 4 (four) hours as needed for wheezing or shortness of breath. 07/23/16  Yes Francine Graven, DO  albuterol (PROVENTIL) (2.5 MG/3ML) 0.083% nebulizer solution Take 2.5 mg by nebulization every 6 (six) hours as needed for wheezing or shortness of breath.    Yes Historical Provider, MD  ALPRAZolam Duanne Moron) 1 MG tablet Take 1 mg by mouth 4 (four) times daily.     Yes Historical Provider, MD  baclofen (LIORESAL) 20 MG tablet Take 20 mg by mouth 3 (three) times daily as needed for muscle spasms.   Yes Historical Provider, MD  carvedilol (COREG) 12.5 MG tablet Take 1 tablet (12.5 mg total) by mouth 2 (two) times daily with a meal. 05/26/16  Yes Cherene Altes, MD  fluticasone (FLONASE) 50 MCG/ACT nasal spray Place 2 sprays into both nostrils at bedtime.   Yes Historical Provider, MD  furosemide (LASIX) 20 MG tablet Take 20 mg by mouth daily as needed for edema.   Yes Historical Provider, MD  hydrALAZINE (APRESOLINE) 10 MG tablet Take 1 tablet (10 mg total) by mouth every 8 (eight) hours. 05/26/16  Yes Cherene Altes, MD  HYDROcodone-acetaminophen (NORCO/VICODIN) 5-325 MG tablet Take 1 tablet by mouth every 4 (four) hours as needed for moderate pain.   Yes Historical Provider, MD  lisinopril-hydrochlorothiazide (PRINZIDE,ZESTORETIC) 20-12.5 MG tablet Take 1  tablet by mouth daily.   Yes Historical Provider, MD  metoCLOPramide (REGLAN) 10 MG tablet Take 1 tablet (10 mg total) by mouth every 6 (six) hours as needed for nausea (or headache). 10/27/30  Yes Delora Fuel, MD  rOPINIRole (REQUIP) 1 MG tablet Take 1 mg by mouth at bedtime.    Yes Historical Provider, MD    Family History Family History  Problem Relation Age of Onset  . Adopted: Yes  . Family history unknown: Yes    Social History Social History  Substance Use Topics  . Smoking status: Former Smoker    Packs/day: 1.50    Years: 25.00    Types: Cigarettes    Quit date: 10/23/2007  . Smokeless tobacco: Former Systems developer     Comment: Quit x 6 years ago  . Alcohol use No     Allergies   Codeine   Review of Systems Review of Systems 10 Systems reviewed and all are negative for acute change except as noted in the HPI. Physical Exam Updated Vital Signs BP  133/84 (BP Location: Right Arm)   Pulse 87   Temp 97.5 F (36.4 C) (Temporal)   Resp 18   Ht 5\' 8"  (1.727 m)   Wt 280 lb (127 kg)   SpO2 100%   BMI 42.57 kg/m   Physical Exam  Constitutional: She is oriented to person, place, and time. She appears well-developed and well-nourished.  Pale but NAD.  HENT:  Head: Normocephalic and atraumatic.  Eyes: Conjunctivae are normal.  Neck: Neck supple.  No meningeal signs.  Cardiovascular: Normal rate and regular rhythm.   Pulmonary/Chest: Effort normal and breath sounds normal.  Abdominal: Soft. Bowel sounds are normal.  Musculoskeletal: Normal range of motion.  Neurological: She is alert and oriented to person, place, and time.  Skin: Skin is warm and dry.  Psychiatric: She has a normal mood and affect. Her behavior is normal.  Nursing note and vitals reviewed.    ED Treatments / Results  DIAGNOSTIC STUDIES: Oxygen Saturation is 100% on RA, normal by my interpretation.    COORDINATION OF CARE: 8:37 PM Discussed treatment plan with pt at bedside and pt agreed to  plan.  Labs (all labs ordered are listed, but only abnormal results are displayed) Labs Reviewed  CBC WITH DIFFERENTIAL/PLATELET - Abnormal; Notable for the following:       Result Value   Hemoglobin 11.6 (*)    HCT 34.9 (*)    All other components within normal limits  BASIC METABOLIC PANEL - Abnormal; Notable for the following:    Glucose, Bld 153 (*)    BUN 62 (*)    Creatinine, Ser 4.08 (*)    GFR calc non Af Amer 12 (*)    GFR calc Af Amer 14 (*)    All other components within normal limits    EKG  EKG Interpretation None       Radiology No results found.  Procedures Procedures (including critical care time)  Medications Ordered in ED Medications  sodium chloride 0.9 % bolus 1,000 mL (0 mLs Intravenous Stopped 10/16/16 0112)  ketorolac (TORADOL) 30 MG/ML injection 30 mg (30 mg Intravenous Given 10/15/16 2116)  metoCLOPramide (REGLAN) injection 10 mg (10 mg Intravenous Given 10/15/16 2116)  diphenhydrAMINE (BENADRYL) injection 25 mg (25 mg Intravenous Given 10/15/16 2117)     Initial Impression / Assessment and Plan / ED Course  I have reviewed the triage vital signs and the nursing notes.  Pertinent labs & imaging results that were available during my care of the patient were reviewed by me and considered in my medical decision making (see chart for details).     No neuro deficits or stiff neck. Patient feels better after IV fluids, Toradol, IV Reglan, IV Benadryl.  Creatinine is elevated, but it has been elevated in the past. White count normal.  Final Clinical Impressions(s) / ED Diagnoses   Final diagnoses:  Intractable headache, unspecified chronicity pattern, unspecified headache type    New Prescriptions Discharge Medication List as of 10/15/2016 11:31 PM     I personally performed the services described in this documentation, which was scribed in my presence. The recorded information has been reviewed and is accurate.      Nat Christen,  MD 10/18/16 2258

## 2016-10-15 NOTE — ED Notes (Signed)
Pt's family member out at the desk asking about labwork

## 2016-10-20 ENCOUNTER — Other Ambulatory Visit: Payer: Self-pay | Admitting: Vascular Surgery

## 2016-10-20 DIAGNOSIS — Z01810 Encounter for preprocedural cardiovascular examination: Secondary | ICD-10-CM

## 2016-10-20 DIAGNOSIS — N184 Chronic kidney disease, stage 4 (severe): Secondary | ICD-10-CM

## 2016-10-23 ENCOUNTER — Emergency Department (HOSPITAL_COMMUNITY)
Admission: EM | Admit: 2016-10-23 | Discharge: 2016-10-23 | Disposition: A | Discharge status: Home / Self Care | Payer: Medicaid Other | Attending: Emergency Medicine | Admitting: Emergency Medicine

## 2016-10-23 ENCOUNTER — Encounter (HOSPITAL_COMMUNITY): Payer: Self-pay

## 2016-10-23 ENCOUNTER — Emergency Department (HOSPITAL_COMMUNITY): Payer: Medicaid Other

## 2016-10-23 DIAGNOSIS — R197 Diarrhea, unspecified: Secondary | ICD-10-CM | POA: Diagnosis not present

## 2016-10-23 DIAGNOSIS — E119 Type 2 diabetes mellitus without complications: Secondary | ICD-10-CM | POA: Diagnosis not present

## 2016-10-23 DIAGNOSIS — I10 Essential (primary) hypertension: Secondary | ICD-10-CM | POA: Diagnosis not present

## 2016-10-23 DIAGNOSIS — R0602 Shortness of breath: Secondary | ICD-10-CM | POA: Diagnosis present

## 2016-10-23 DIAGNOSIS — Z79899 Other long term (current) drug therapy: Secondary | ICD-10-CM | POA: Insufficient documentation

## 2016-10-23 DIAGNOSIS — J441 Chronic obstructive pulmonary disease with (acute) exacerbation: Secondary | ICD-10-CM

## 2016-10-23 DIAGNOSIS — Z87891 Personal history of nicotine dependence: Secondary | ICD-10-CM | POA: Diagnosis not present

## 2016-10-23 LAB — BASIC METABOLIC PANEL
Anion gap: 10 (ref 5–15)
BUN: 64 mg/dL — AB (ref 6–20)
CO2: 22 mmol/L (ref 22–32)
Calcium: 9.5 mg/dL (ref 8.9–10.3)
Chloride: 108 mmol/L (ref 101–111)
Creatinine, Ser: 5.21 mg/dL — ABNORMAL HIGH (ref 0.44–1.00)
GFR calc Af Amer: 10 mL/min — ABNORMAL LOW (ref >60)
GFR, EST NON AFRICAN AMERICAN: 9 mL/min — AB (ref >60)
GLUCOSE: 121 mg/dL — AB (ref 65–99)
POTASSIUM: 4.4 mmol/L (ref 3.5–5.1)
Sodium: 140 mmol/L (ref 135–145)

## 2016-10-23 LAB — CBC WITH DIFFERENTIAL/PLATELET
Basophils Absolute: 0 10*3/uL (ref 0.0–0.1)
Basophils Relative: 0 %
Eosinophils Absolute: 1.2 10*3/uL — ABNORMAL HIGH (ref 0.0–0.7)
Eosinophils Relative: 13 %
HEMATOCRIT: 37 % (ref 36.0–46.0)
HEMOGLOBIN: 12.2 g/dL (ref 12.0–15.0)
LYMPHS ABS: 4 10*3/uL (ref 0.7–4.0)
LYMPHS PCT: 43 %
MCH: 28.2 pg (ref 26.0–34.0)
MCHC: 33 g/dL (ref 30.0–36.0)
MCV: 85.5 fL (ref 78.0–100.0)
Monocytes Absolute: 0.6 10*3/uL (ref 0.1–1.0)
Monocytes Relative: 6 %
NEUTROS ABS: 3.5 10*3/uL (ref 1.7–7.7)
Neutrophils Relative %: 38 %
Platelets: 158 10*3/uL (ref 150–400)
RBC: 4.33 MIL/uL (ref 3.87–5.11)
RDW: 15.3 % (ref 11.5–15.5)
WBC: 9.3 10*3/uL (ref 4.0–10.5)

## 2016-10-23 LAB — LACTIC ACID, PLASMA: Lactic Acid, Venous: 1 mmol/L (ref 0.5–1.9)

## 2016-10-23 LAB — TROPONIN I: Troponin I: <0.03 ng/mL (ref <0.03)

## 2016-10-23 MED ORDER — LOPERAMIDE HCL 2 MG PO CAPS
4.0000 mg | ORAL_CAPSULE | Freq: Once | ORAL | Status: AC
  Administered 2016-10-23: 4 mg via ORAL
  Filled 2016-10-23: qty 2

## 2016-10-23 MED ORDER — ALBUTEROL SULFATE (2.5 MG/3ML) 0.083% IN NEBU
2.5000 mg | INHALATION_SOLUTION | Freq: Once | RESPIRATORY_TRACT | Status: AC
  Administered 2016-10-23: 2.5 mg via RESPIRATORY_TRACT
  Filled 2016-10-23: qty 3

## 2016-10-23 MED ORDER — IPRATROPIUM-ALBUTEROL 0.5-2.5 (3) MG/3ML IN SOLN
3.0000 mL | Freq: Once | RESPIRATORY_TRACT | Status: AC
  Administered 2016-10-23: 3 mL via RESPIRATORY_TRACT
  Filled 2016-10-23: qty 3

## 2016-10-23 MED ORDER — ONDANSETRON HCL 4 MG/2ML IJ SOLN
4.0000 mg | Freq: Once | INTRAMUSCULAR | Status: AC
  Administered 2016-10-23: 4 mg via INTRAVENOUS
  Filled 2016-10-23: qty 2

## 2016-10-23 MED ORDER — SODIUM CHLORIDE 0.9 % IV BOLUS (SEPSIS): 1000.0000 mL | Freq: Once | INTRAVENOUS | Status: DC

## 2016-10-23 MED ORDER — SODIUM CHLORIDE 0.9 % IV BOLUS (SEPSIS)
1000.0000 mL | Freq: Once | INTRAVENOUS | Status: AC
  Administered 2016-10-23: 1000 mL via INTRAVENOUS

## 2016-10-23 MED ORDER — LOPERAMIDE HCL 2 MG PO CAPS: 2.0000 mg | ORAL_CAPSULE | Freq: Four times a day (QID) | ORAL | 0 refills | Status: DC | PRN

## 2016-10-23 MED ORDER — PREDNISONE 10 MG PO TABS: ORAL_TABLET | ORAL | 0 refills | Status: DC

## 2016-10-23 NOTE — ED Triage Notes (Signed)
Pt reports that SOB with exertion began last night. Unable to use cpap because she couldn't breathe. Pt also reports some coughing and diarhhea

## 2016-10-23 NOTE — Discharge Instructions (Signed)
Continue using your home nebulizer if your wheezing or shortness of breath returns.  Start the prednisone taper to help with your COPD flare.  Take imodium as directed only if your diarrhea persists.  Make sure you are drinking plenty of fluids.  See your doctor for a recheck if not improving over the next 24 hours.

## 2016-10-23 NOTE — ED Notes (Signed)
Pt ambulated to bathroom with assist and became very weak and short of breath.  Resolved upon returning to bed.  Sats 97% and lung sounds remain clear.

## 2016-10-23 NOTE — ED Provider Notes (Signed)
Martha [name]   CSN: [id number] Arrival date & time: 10/23/16  0800     History   Chief Complaint Chief Complaint  Patient presents with  . Shortness of Breath    HPI Martha [name] is a 51 y.o. female with a history as outlined below, most pertinent today for COPD, DM, HTN , advanced renal disease (pt states getting ready for dialysis) and sleep apnea using a cpap machine presenting with shortness of breath which started gradually last evening as she was trying to use her cpap machine.  She endorses she felt like it was blowing warm air instead of cooled air and it was "smothering" her.  She reports she is good with maintaining her machine and to her knowledge it is working appropriately.  She had wheezing with this episode which was not improved with a home neb treatment.  She also notes a nonproductive cough. She denies cp, fevers, chills.  She also endorses non bloody, watery diarrhea x 10 since yesterday along with mild nausea without emesis.  She denies any other household members with similar symptoms and no recent abx exposure or foreign travel. She has had no medicines for this complaint.   The history is provided by the patient.    Past Medical History:  Diagnosis Date  . Acid reflux   . Anemia   . Arthritis   . Bipolar 1 disorder ([place])   . Cervical radiculopathy   . COPD (chronic obstructive pulmonary disease) ([place]) 2014   bronchitis  . Degenerative disc disease, thoracic   . Depression   . Diabetes mellitus   . Hypertension   . Noncompliance with medication regimen   . Obesity (BMI 30-39.9)   . OSA (obstructive sleep apnea)    cpap  . RLS (restless legs syndrome)     Patient Active Problem List   Diagnosis Date Noted  . Acute on chronic renal failure ([place]) 05/25/2016  . Abdominal pain 04/26/2014  . Cholelithiasis 04/26/2014  . Acute renal failure ([place]) 04/26/2014  . Dehydration 04/26/2014  . Hyponatremia 04/26/2014  . Gallstones  04/06/2014  . Uncontrolled diabetes mellitus ([place]) 10/09/2013  . Breast abscess 05/30/2013  . Diabetes mellitus, type 2 ([place]) 05/30/2013  . Hyperglycemia 05/30/2013  . Internal hemorrhoids with other complication 68/06/7516  . Umbilical hernia without mention of obstruction or gangrene 04/15/2013  . Anemia 04/15/2013  . DM 05/13/2010  . Hyperlipidemia 05/13/2010  . OBESITY 05/13/2010  . Depression 05/13/2010  . RESTLESS LEG SYNDROME 05/13/2010  . Essential hypertension 05/13/2010  . GERD 05/13/2010  . RECTAL BLEEDING 05/13/2010  . Sleep apnea 05/13/2010  . Dysphagia, oropharyngeal phase 05/13/2010    Past Surgical History:  Procedure Laterality Date  . BIOPSY N/A 05/17/2013   Procedure: BIOPSY;  Surgeon: [name], MD;  Location: [facility];  Service: Endoscopy;  Laterality: N/A;  . CHOLECYSTECTOMY N/A 04/27/2014   Procedure: LAPAROSCOPIC CHOLECYSTECTOMY;  Surgeon: [name], MD;  Location: [facility];  Service: General;  Laterality: N/A;  . COLONOSCOPY  06/06/2010   GYF:VCBSWH bleeding secondary to internal hemorrhoids but incomplete evaluation secondary to poor right colon prep/small rectal and sigmoid colon polyps (hyperplastic). PROPOFOL  . COLONOSCOPY  May 2013   Dr. Gilliam/[facility]: 5 mm a descending colon polyp, hyperplastic. Adequate bowel prep.  . ENDOMETRIAL ABLATION    . ESOPHAGOGASTRODUODENOSCOPY  06/06/2010   QPR:FFMBWGY erythema and edema of body of stomach, with sessile polypoid lesions. bx benign. no h.pylori  . ESOPHAGOGASTRODUODENOSCOPY (EGD) WITH PROPOFOL  N/A 05/17/2013   Dr. Oneida Alar: normal esophagus, moderate nodular gastritis, negative path, empiric Savary dilation  . FLEXIBLE SIGMOIDOSCOPY N/A 05/17/2013   Dr. Oneida Alar: moderate sized internal hemorrhoids  . HEMORRHOID BANDING N/A 05/17/2013   Procedure: HEMORRHOID BANDING;  Surgeon: [name], MD;  Location: [facility];  Service: Endoscopy;  Laterality: N/A;  2 bands placed  . INCISION AND DRAINAGE  ABSCESS Left 10/11/2013   Procedure: INCISION AND DRAINAGE AND DEBRIDEMENT LEFT BREAST  ABSCESS;  Surgeon: [name], MD;  Location: [facility];  Service: General;  Laterality: Left;  . IRRIGATION AND DEBRIDEMENT ABSCESS Right 06/01/2013   Procedure: INCISION AND DRAINAGE AND DEBRIDEMENT ABSCESS RIGHT BREAST;  Surgeon: [name], MD;  Location: [facility];  Service: General;  Laterality: Right;  . SAVORY DILATION N/A 05/17/2013   Procedure: SAVORY DILATION;  Surgeon: [name], MD;  Location: [facility];  Service: Endoscopy;  Laterality: N/A;  14/15/16  . TUBAL LIGATION      OB History    Gravida Para Term Preterm AB Living   2 2 1 1   2    SAB TAB Ectopic Multiple Live Births                   Home Medications    Prior to Admission medications   Medication Sig Start Date End Date Taking? Authorizing Provider  albuterol (PROVENTIL HFA;VENTOLIN HFA) 108 (90 Base) MCG/ACT inhaler Inhale 2 puffs into the lungs every 4 (four) hours as needed for wheezing or shortness of breath. 07/23/16  Yes Francine Graven, DO  albuterol (PROVENTIL) (2.5 MG/3ML) 0.083% nebulizer solution Take 2.5 mg by nebulization every 6 (six) hours as needed for wheezing or shortness of breath.    Yes Historical Provider, MD  ALPRAZolam Duanne Moron) 1 MG tablet Take 1 mg by mouth 4 (four) times daily.     Yes Historical Provider, MD  atorvastatin (LIPITOR) 10 MG tablet Take 10 mg by mouth daily. Take along with 20 mg tablet to equal 30 mg daily.   Yes Historical Provider, MD  atorvastatin (LIPITOR) 20 MG tablet Take 20 mg by mouth daily. Take along with 10 mg tablet to equal 30 mg daily.   Yes Historical Provider, MD  baclofen (LIORESAL) 20 MG tablet Take 20 mg by mouth 3 (three) times daily as needed for muscle spasms.   Yes Historical Provider, MD  carvedilol (COREG) 12.5 MG tablet Take 1 tablet (12.5 mg total) by mouth 2 (two) times daily with a meal. 05/26/16  Yes Cherene Altes, MD  fluticasone (FLONASE) 50  MCG/ACT nasal spray Place 2 sprays into both nostrils at bedtime.   Yes Historical Provider, MD  furosemide (LASIX) 20 MG tablet Take 20 mg by mouth daily as needed for edema.   Yes Historical Provider, MD  hydrALAZINE (APRESOLINE) 10 MG tablet Take 1 tablet (10 mg total) by mouth every 8 (eight) hours. 05/26/16  Yes Cherene Altes, MD  HYDROcodone-acetaminophen (NORCO/VICODIN) 5-325 MG tablet Take 1 tablet by mouth every 4 (four) hours as needed for moderate pain.   Yes Historical Provider, MD  lisinopril-hydrochlorothiazide (PRINZIDE,ZESTORETIC) 20-12.5 MG tablet Take 1 tablet by mouth daily.   Yes Historical Provider, MD  metoCLOPramide (REGLAN) 10 MG tablet Take 1 tablet (10 mg total) by mouth every 6 (six) hours as needed for nausea (or headache). 04/03/27  Yes Delora Fuel, MD  rOPINIRole (REQUIP) 1 MG tablet Take 1 mg by mouth at bedtime.    Yes Historical Provider,  MD  loperamide (IMODIUM) 2 MG capsule Take 1 capsule (2 mg total) by mouth 4 (four) times daily as needed for diarrhea or loose stools. 10/23/16   Evalee Jefferson, PA-C  predniSONE (DELTASONE) 10 MG tablet Take 6 tablets day one, 5 tablets day two, 4 tablets day three, 3 tablets day four, 2 tablets day five, then 1 tablet day six 10/23/16   Evalee Jefferson, PA-C    Family History Family History  Problem Relation Age of Onset  . Adopted: Yes  . Family history unknown: Yes    Social History Social History  Substance Use Topics  . Smoking status: Former Smoker    Packs/day: 1.50    Years: 25.00    Types: Cigarettes    Quit date: 10/23/2007  . Smokeless tobacco: Former Systems developer     Comment: Quit x 6 years ago  . Alcohol use No     Allergies   Codeine   Review of Systems Review of Systems  Constitutional: Negative for chills and fever.  HENT: Negative for congestion and sore throat.   Eyes: Negative.   Respiratory: Positive for cough and shortness of breath. Negative for chest tightness.   Cardiovascular: Negative for chest  pain.  Gastrointestinal: Positive for diarrhea. Negative for abdominal pain, nausea and vomiting.  Genitourinary: Negative.   Musculoskeletal: Negative for arthralgias, joint swelling and neck pain.  Skin: Negative.  Negative for rash and wound.  Neurological: Negative for dizziness, weakness, light-headedness, numbness and headaches.  Psychiatric/Behavioral: Negative.      Physical Exam Updated Vital Signs BP 119/71 (BP Location: Right Arm)   Pulse (!) 108   Temp 97.7 F (36.5 C) (Oral)   Resp 15   Ht 5\' 8"  (1.727 m)   Wt 127 kg   SpO2 98%   BMI 42.57 kg/m   Physical Exam  Constitutional: She appears well-developed and well-nourished.  HENT:  Head: Normocephalic and atraumatic.  Eyes: Conjunctivae are normal.  Neck: Normal range of motion.  Cardiovascular: Normal rate, regular rhythm, normal heart sounds and intact distal pulses.   Pulmonary/Chest: Effort normal. She has no wheezes. She has no rales.  Distant breath sounds throughout.  Abdominal: Soft. Bowel sounds are normal. She exhibits no mass. There is tenderness. There is no rebound and no guarding.  Diffuse ttp without guard or rebound.  Musculoskeletal: Normal range of motion.  Neurological: She is alert.  Skin: Skin is warm and dry.  Psychiatric: She has a normal mood and affect.  Nursing [name] and vitals reviewed.    ED Treatments / Results  Labs (all labs ordered are listed, but only abnormal results are displayed) Labs Reviewed  CBC WITH DIFFERENTIAL/PLATELET - Abnormal; Notable for the following:       Result Value   Eosinophils Absolute 1.2 (*)    All other components within normal limits  BASIC METABOLIC PANEL - Abnormal; Notable for the following:    Glucose, Bld 121 (*)    BUN 64 (*)    Creatinine, Ser 5.21 (*)    GFR calc non Af Amer 9 (*)    GFR calc Af Amer 10 (*)    All other components within normal limits  TROPONIN I  LACTIC ACID, PLASMA    EKG  EKG  Interpretation  Date/Time:  Thursday October 23 2016 08:19:01 EDT Ventricular Rate:  93 PR Interval:    QRS Duration: 93 QT Interval:  335 QTC Calculation: 417 R Axis:   46 Text Interpretation:  Sinus rhythm Low voltage,  precordial leads Borderline repolarization abnormality Confirmed by Lacinda Axon  MD, [place] ([id number]) on 10/23/2016 8:41:35 AM       Radiology Dg Chest 2 View  Result Date: 10/23/2016 CLINICAL DATA:  Onset of shortness of breath with exertion last night associated with coughing and diarrhea. History of COPD. Former smoker. EXAM: CHEST  2 VIEW COMPARISON:  PA and lateral chest x-ray of July 26, 2016 FINDINGS: The lungs are well-expanded and clear. The heart and pulmonary vascularity are normal. The mediastinum is normal in width. There is no pleural effusion. The bony thorax exhibits no acute abnormality. IMPRESSION: There is no pneumonia nor other acute cardiopulmonary abnormality. Electronically Signed   By: David  [place] M.D.   On: 10/23/2016 08:40    Procedures Procedures (including critical care time)  Medications Ordered in ED Medications  ipratropium-albuterol (DUONEB) 0.5-2.5 (3) MG/3ML nebulizer solution 3 mL (3 mLs Nebulization Given 10/23/16 0845)  albuterol (PROVENTIL) (2.5 MG/3ML) 0.083% nebulizer solution 2.5 mg (2.5 mg Nebulization Given 10/23/16 0846)  sodium chloride 0.9 % bolus 1,000 mL (0 mLs Intravenous Stopped 10/23/16 1059)  ondansetron (ZOFRAN) injection 4 mg (4 mg Intravenous Given 10/23/16 0916)  loperamide (IMODIUM) capsule 4 mg (4 mg Oral Given 10/23/16 1026)  sodium chloride 0.9 % bolus 1,000 mL (0 mLs Intravenous Stopped 10/23/16 1218)     Initial Impression / Assessment and Plan / ED Course  I have reviewed the triage vital signs and the nursing notes.  Pertinent labs & imaging results that were available during my care of the patient were reviewed by me and considered in my medical decision making (see chart for details).     Pt given  albuterol/atrovent neb with sig improvement in breathing, fuller breath sounds, more aeration, still no wheezing.  She felt improved.  Also given NS 2 L given hypotension and h/o diarrhea for rehydration.  Electrolytes ok,  Imodium given.  Pt to be dc'd home, plan close f/u with pcp. Returning if sx worsen .   Final Clinical Impressions(s) / ED Diagnoses   Final diagnoses:  COPD exacerbation (HCC)  Diarrhea, unspecified type    New Prescriptions New Prescriptions   LOPERAMIDE (IMODIUM) 2 MG CAPSULE    Take 1 capsule (2 mg total) by mouth 4 (four) times daily as needed for diarrhea or loose stools.   PREDNISONE (DELTASONE) 10 MG TABLET    Take 6 tablets day one, 5 tablets day two, 4 tablets day three, 3 tablets day four, 2 tablets day five, then 1 tablet day six     Evalee Jefferson, PA-C 10/23/16 [place], MD 10/24/16 [phone]

## 2016-10-27 ENCOUNTER — Observation Stay (HOSPITAL_COMMUNITY)
Admission: EM | Admit: 2016-10-27 | Discharge: 2016-10-29 | Disposition: A | Discharge status: Home / Self Care | Payer: Medicaid Other | Attending: Pulmonary Disease | Admitting: Pulmonary Disease

## 2016-10-27 ENCOUNTER — Encounter (HOSPITAL_COMMUNITY): Payer: Self-pay | Admitting: *Deleted

## 2016-10-27 ENCOUNTER — Observation Stay (HOSPITAL_COMMUNITY): Payer: Medicaid Other

## 2016-10-27 ENCOUNTER — Emergency Department (HOSPITAL_COMMUNITY): Payer: Medicaid Other

## 2016-10-27 ENCOUNTER — Observation Stay (HOSPITAL_BASED_OUTPATIENT_CLINIC_OR_DEPARTMENT_OTHER): Payer: Medicaid Other

## 2016-10-27 DIAGNOSIS — R209 Unspecified disturbances of skin sensation: Secondary | ICD-10-CM

## 2016-10-27 DIAGNOSIS — R079 Chest pain, unspecified: Secondary | ICD-10-CM | POA: Diagnosis not present

## 2016-10-27 DIAGNOSIS — I10 Essential (primary) hypertension: Secondary | ICD-10-CM | POA: Diagnosis not present

## 2016-10-27 DIAGNOSIS — G8194 Hemiplegia, unspecified affecting left nondominant side: Secondary | ICD-10-CM

## 2016-10-27 DIAGNOSIS — R072 Precordial pain: Secondary | ICD-10-CM

## 2016-10-27 DIAGNOSIS — N185 Chronic kidney disease, stage 5: Secondary | ICD-10-CM | POA: Diagnosis not present

## 2016-10-27 DIAGNOSIS — Z87891 Personal history of nicotine dependence: Secondary | ICD-10-CM | POA: Diagnosis not present

## 2016-10-27 DIAGNOSIS — R29898 Other symptoms and signs involving the musculoskeletal system: Secondary | ICD-10-CM

## 2016-10-27 DIAGNOSIS — E1165 Type 2 diabetes mellitus with hyperglycemia: Secondary | ICD-10-CM

## 2016-10-27 DIAGNOSIS — J449 Chronic obstructive pulmonary disease, unspecified: Secondary | ICD-10-CM | POA: Diagnosis not present

## 2016-10-27 DIAGNOSIS — Z794 Long term (current) use of insulin: Secondary | ICD-10-CM

## 2016-10-27 DIAGNOSIS — R200 Anesthesia of skin: Principal | ICD-10-CM | POA: Insufficient documentation

## 2016-10-27 DIAGNOSIS — Z79899 Other long term (current) drug therapy: Secondary | ICD-10-CM | POA: Diagnosis not present

## 2016-10-27 DIAGNOSIS — E119 Type 2 diabetes mellitus without complications: Secondary | ICD-10-CM | POA: Insufficient documentation

## 2016-10-27 DIAGNOSIS — E785 Hyperlipidemia, unspecified: Secondary | ICD-10-CM | POA: Diagnosis present

## 2016-10-27 LAB — TROPONIN I
Troponin I: <0.03 ng/mL (ref <0.03)
Troponin I: <0.03 ng/mL (ref <0.03)
Troponin I: <0.03 ng/mL (ref <0.03)

## 2016-10-27 LAB — CBC WITH DIFFERENTIAL/PLATELET
BASOS ABS: 0 10*3/uL (ref 0.0–0.1)
Basophils Relative: 0 %
Eosinophils Absolute: 0 10*3/uL (ref 0.0–0.7)
Eosinophils Relative: 0 %
HCT: 37.6 % (ref 36.0–46.0)
Hemoglobin: 12.3 g/dL (ref 12.0–15.0)
LYMPHS PCT: 19 %
Lymphs Abs: 1.4 10*3/uL (ref 0.7–4.0)
MCH: 27.8 pg (ref 26.0–34.0)
MCHC: 32.7 g/dL (ref 30.0–36.0)
MCV: 84.9 fL (ref 78.0–100.0)
MONOS PCT: 4 %
Monocytes Absolute: 0.3 10*3/uL (ref 0.1–1.0)
NEUTROS ABS: 5.9 10*3/uL (ref 1.7–7.7)
NEUTROS PCT: 77 %
PLATELETS: 194 10*3/uL (ref 150–400)
RBC: 4.43 MIL/uL (ref 3.87–5.11)
RDW: 14.9 % (ref 11.5–15.5)
WBC: 7.7 10*3/uL (ref 4.0–10.5)

## 2016-10-27 LAB — ECHOCARDIOGRAM COMPLETE
AVLVOTPG: 6 mmHg
CHL CUP MV DEC (S): 239
E decel time: 239 msec
EERAT: 12.42
FS: 34 % (ref 28–44)
Height: 68 in
IV/PV OW: 1.04
LA vol index: 30.4 mL/m2
LA vol: 76.9 mL
LADIAMINDEX: 1.58 cm/m2
LASIZE: 40 mm
LAVOLA4C: 69.6 mL
LEFT ATRIUM END SYS DIAM: 40 mm
LV PW d: 12.4 mm — AB (ref 0.6–1.1)
LV SIMPSON'S DISK: 63
LV dias vol index: 31 mL/m2
LV e' LATERAL: 9.9 cm/s
LV sys vol: 28 mL
LVDIAVOL: 77 mL (ref 46–106)
LVEEAVG: 12.42
LVEEMED: 12.42
LVOT SV: 74 mL
LVOT VTI: 29.1 cm
LVOT area: 2.54 cm2
LVOT diameter: 18 mm
LVOT peak vel: 126 cm/s
LVSYSVOLIN: 11 mL/m2
Lateral S' vel: 12.4 cm/s
MV Peak grad: 6 mmHg
MV pk A vel: 77.1 m/s
MVPKEVEL: 123 m/s
RV TAPSE: 20.7 mm
Stroke v: 49 ml
TDI e' lateral: 9.9
TDI e' medial: 8.16
VTI: 218 cm
Weight: 4480 oz

## 2016-10-27 LAB — URINALYSIS, ROUTINE W REFLEX MICROSCOPIC
BILIRUBIN URINE: NEGATIVE
GLUCOSE, UA: 50 mg/dL — AB
Hgb urine dipstick: NEGATIVE
KETONES UR: NEGATIVE mg/dL
LEUKOCYTES UA: NEGATIVE
Nitrite: NEGATIVE
Protein, ur: >=300 mg/dL — AB
Specific Gravity, Urine: 1.011 (ref 1.005–1.030)
pH: 6 (ref 5.0–8.0)

## 2016-10-27 LAB — RAPID URINE DRUG SCREEN, HOSP PERFORMED
Amphetamines: NOT DETECTED
BARBITURATES: NOT DETECTED
Benzodiazepines: POSITIVE — AB
Cocaine: NOT DETECTED
Opiates: POSITIVE — AB
Tetrahydrocannabinol: NOT DETECTED

## 2016-10-27 LAB — GLUCOSE, CAPILLARY
GLUCOSE-CAPILLARY: 127 mg/dL — AB (ref 65–99)
Glucose-Capillary: 93 mg/dL (ref 65–99)

## 2016-10-27 LAB — CK: Total CK: 28 U/L — ABNORMAL LOW (ref 38–234)

## 2016-10-27 LAB — COMPREHENSIVE METABOLIC PANEL
ALT: 24 U/L (ref 14–54)
AST: 14 U/L — AB (ref 15–41)
Albumin: 3.8 g/dL (ref 3.5–5.0)
Alkaline Phosphatase: 83 U/L (ref 38–126)
Anion gap: 12 (ref 5–15)
BUN: 54 mg/dL — AB (ref 6–20)
CHLORIDE: 107 mmol/L (ref 101–111)
CO2: 22 mmol/L (ref 22–32)
Calcium: 9.7 mg/dL (ref 8.9–10.3)
Creatinine, Ser: 4.25 mg/dL — ABNORMAL HIGH (ref 0.44–1.00)
GFR, EST AFRICAN AMERICAN: 13 mL/min — AB (ref >60)
GFR, EST NON AFRICAN AMERICAN: 11 mL/min — AB (ref >60)
Glucose, Bld: 146 mg/dL — ABNORMAL HIGH (ref 65–99)
POTASSIUM: 4.7 mmol/L (ref 3.5–5.1)
SODIUM: 141 mmol/L (ref 135–145)
Total Bilirubin: 0.5 mg/dL (ref 0.3–1.2)
Total Protein: 7.6 g/dL (ref 6.5–8.1)

## 2016-10-27 LAB — FOLATE: FOLATE: 7.9 ng/mL (ref >5.9)

## 2016-10-27 MED ORDER — NICARDIPINE HCL IN NACL 20-0.86 MG/200ML-% IV SOLN
5.0000 mg/h | Freq: Once | INTRAVENOUS | Status: AC
  Administered 2016-10-27: 5 mg/h via INTRAVENOUS
  Filled 2016-10-27: qty 200

## 2016-10-27 MED ORDER — HYDRALAZINE HCL 20 MG/ML IJ SOLN: 10.0000 mg | Freq: Four times a day (QID) | INTRAMUSCULAR | Status: DC | PRN

## 2016-10-27 MED ORDER — ACETAMINOPHEN 325 MG PO TABS: 650.0000 mg | ORAL_TABLET | Freq: Four times a day (QID) | ORAL | Status: DC | PRN

## 2016-10-27 MED ORDER — HYDRALAZINE HCL 10 MG PO TABS
10.0000 mg | ORAL_TABLET | Freq: Three times a day (TID) | ORAL | Status: DC
  Administered 2016-10-27 – 2016-10-28 (×3): 10 mg via ORAL
  Filled 2016-10-27 (×3): qty 1

## 2016-10-27 MED ORDER — ONDANSETRON HCL 4 MG PO TABS: 4.0000 mg | ORAL_TABLET | Freq: Four times a day (QID) | ORAL | Status: DC | PRN

## 2016-10-27 MED ORDER — INSULIN ASPART 100 UNIT/ML ~~LOC~~ SOLN
0.0000 [IU] | Freq: Three times a day (TID) | SUBCUTANEOUS | Status: DC
  Administered 2016-10-27 – 2016-10-29 (×3): 1 [IU] via SUBCUTANEOUS

## 2016-10-27 MED ORDER — ALPRAZOLAM 1 MG PO TABS
1.0000 mg | ORAL_TABLET | Freq: Four times a day (QID) | ORAL | Status: DC
  Administered 2016-10-27 – 2016-10-29 (×8): 1 mg via ORAL
  Filled 2016-10-27 (×8): qty 1

## 2016-10-27 MED ORDER — METOCLOPRAMIDE HCL 10 MG PO TABS: 10.0000 mg | ORAL_TABLET | Freq: Four times a day (QID) | ORAL | Status: DC | PRN

## 2016-10-27 MED ORDER — LOPERAMIDE HCL 2 MG PO CAPS: 2.0000 mg | ORAL_CAPSULE | Freq: Four times a day (QID) | ORAL | Status: DC | PRN

## 2016-10-27 MED ORDER — ENOXAPARIN SODIUM 40 MG/0.4ML ~~LOC~~ SOLN: 40.0000 mg | SUBCUTANEOUS | Status: DC

## 2016-10-27 MED ORDER — CLONIDINE HCL 0.2 MG PO TABS
0.2000 mg | ORAL_TABLET | Freq: Once | ORAL | Status: AC
  Administered 2016-10-27: 0.2 mg via ORAL
  Filled 2016-10-27: qty 1

## 2016-10-27 MED ORDER — ONDANSETRON HCL 4 MG/2ML IJ SOLN: 4.0000 mg | Freq: Four times a day (QID) | INTRAMUSCULAR | Status: DC | PRN

## 2016-10-27 MED ORDER — GLUCERNA SHAKE PO LIQD
237.0000 mL | Freq: Two times a day (BID) | ORAL | Status: DC
  Administered 2016-10-27 – 2016-10-28 (×3): 237 mL via ORAL

## 2016-10-27 MED ORDER — BACLOFEN 10 MG PO TABS
20.0000 mg | ORAL_TABLET | Freq: Three times a day (TID) | ORAL | Status: DC | PRN
  Administered 2016-10-28 (×2): 20 mg via ORAL
  Filled 2016-10-27 (×2): qty 2

## 2016-10-27 MED ORDER — GI COCKTAIL ~~LOC~~
30.0000 mL | Freq: Once | ORAL | Status: AC
  Administered 2016-10-27: 30 mL via ORAL
  Filled 2016-10-27: qty 30

## 2016-10-27 MED ORDER — INSULIN ASPART 100 UNIT/ML ~~LOC~~ SOLN: 0.0000 [IU] | Freq: Every day | SUBCUTANEOUS | Status: DC

## 2016-10-27 MED ORDER — HYDROCODONE-ACETAMINOPHEN 5-325 MG PO TABS
1.0000 | ORAL_TABLET | ORAL | Status: DC | PRN
  Administered 2016-10-28 (×2): 1 via ORAL
  Filled 2016-10-27 (×2): qty 1

## 2016-10-27 MED ORDER — FLUTICASONE PROPIONATE 50 MCG/ACT NA SUSP
2.0000 | Freq: Every day | NASAL | Status: DC
  Administered 2016-10-27 – 2016-10-28 (×2): 2 via NASAL
  Filled 2016-10-27: qty 16

## 2016-10-27 MED ORDER — ENSURE ENLIVE PO LIQD
237.0000 mL | Freq: Two times a day (BID) | ORAL | Status: DC
  Administered 2016-10-27: 237 mL via ORAL

## 2016-10-27 MED ORDER — ENOXAPARIN SODIUM 30 MG/0.3ML ~~LOC~~ SOLN
30.0000 mg | SUBCUTANEOUS | Status: DC
  Administered 2016-10-27 – 2016-10-28 (×2): 30 mg via SUBCUTANEOUS
  Filled 2016-10-27 (×2): qty 0.3

## 2016-10-27 MED ORDER — ATORVASTATIN CALCIUM 20 MG PO TABS: 20.0000 mg | ORAL_TABLET | Freq: Every day | ORAL | Status: DC

## 2016-10-27 MED ORDER — ROPINIROLE HCL 1 MG PO TABS
1.0000 mg | ORAL_TABLET | Freq: Every day | ORAL | Status: DC
  Administered 2016-10-27 – 2016-10-28 (×2): 1 mg via ORAL
  Filled 2016-10-27 (×2): qty 1

## 2016-10-27 MED ORDER — ORAL CARE MOUTH RINSE
15.0000 mL | Freq: Two times a day (BID) | OROMUCOSAL | Status: DC
  Administered 2016-10-27 – 2016-10-29 (×5): 15 mL via OROMUCOSAL

## 2016-10-27 MED ORDER — ALBUTEROL SULFATE (2.5 MG/3ML) 0.083% IN NEBU
2.5000 mg | INHALATION_SOLUTION | Freq: Four times a day (QID) | RESPIRATORY_TRACT | Status: DC
  Administered 2016-10-28: 2.5 mg via RESPIRATORY_TRACT
  Filled 2016-10-27: qty 3

## 2016-10-27 MED ORDER — ALBUTEROL SULFATE (2.5 MG/3ML) 0.083% IN NEBU
2.5000 mg | INHALATION_SOLUTION | RESPIRATORY_TRACT | Status: DC | PRN
  Administered 2016-10-27: 2.5 mg via RESPIRATORY_TRACT
  Filled 2016-10-27: qty 3

## 2016-10-27 MED ORDER — LABETALOL HCL 5 MG/ML IV SOLN
20.0000 mg | Freq: Once | INTRAVENOUS | Status: DC
  Filled 2016-10-27: qty 4

## 2016-10-27 MED ORDER — ENSURE ENLIVE PO LIQD: 237.0000 mL | Freq: Two times a day (BID) | ORAL | Status: DC

## 2016-10-27 MED ORDER — NICARDIPINE HCL IN NACL 20-0.86 MG/200ML-% IV SOLN: 3.0000 mg/h | Freq: Once | INTRAVENOUS | Status: DC

## 2016-10-27 MED ORDER — ACETAMINOPHEN 650 MG RE SUPP: 650.0000 mg | Freq: Four times a day (QID) | RECTAL | Status: DC | PRN

## 2016-10-27 MED ORDER — SODIUM CHLORIDE 0.9 % IV BOLUS (SEPSIS): 500.0000 mL | Freq: Once | INTRAVENOUS | Status: DC

## 2016-10-27 MED ORDER — ALBUTEROL SULFATE (2.5 MG/3ML) 0.083% IN NEBU: 2.5000 mg | INHALATION_SOLUTION | Freq: Four times a day (QID) | RESPIRATORY_TRACT | Status: DC | PRN

## 2016-10-27 MED ORDER — CARVEDILOL 12.5 MG PO TABS
12.5000 mg | ORAL_TABLET | Freq: Two times a day (BID) | ORAL | Status: DC
  Administered 2016-10-27 – 2016-10-29 (×4): 12.5 mg via ORAL
  Filled 2016-10-27 (×4): qty 1

## 2016-10-27 MED ORDER — ATORVASTATIN CALCIUM 10 MG PO TABS
30.0000 mg | ORAL_TABLET | Freq: Every day | ORAL | Status: DC
  Administered 2016-10-27 – 2016-10-28 (×2): 30 mg via ORAL
  Filled 2016-10-27 (×2): qty 3

## 2016-10-27 NOTE — ED Notes (Signed)
MD at bedside.  Verbal order to decrease cardene drip to 2.5mg /hr.

## 2016-10-27 NOTE — ED Notes (Signed)
Pt refused MRI. MD and EDP aware. No orders given.

## 2016-10-27 NOTE — ED Notes (Signed)
MD at bedside. 

## 2016-10-27 NOTE — H&P (Signed)
History and Physical  Martha [name] YTK:[id number] DOB: [birth year] DOA: 10/27/2016   PCP: Alonza Bogus, MD   Patient coming from: Home  Chief Complaint: chest pain and arm numbness  HPI:  Martha [name] is a 51 y.o. female with medical history of 51 year old female with a history of CKD stage V, COPD, hypertension, diabetes mellitus, OSA, restless leg syndrome presents with one-day history of burning in her chest that began around midnight on 10/27/2016. When her symptoms persisted, she presented to the emergency department for further evaluation. She complained of some associated shortness of breath without any dizziness or vomiting although she did have some nausea. She denies any recent long travels or surgeries or trauma. There are no exacerbating or alleviating factors. In addition, the patient has been complaining of bilateral forearm numbness and tingling from her elbows to her fingers. This has been present for the past week, but was worsened last evening when she had her associated chest burning. She states that she has had this numbness and tingling in her forearms in the past. She complains also of left upper extremity weakness, but states this has been present for many months since her fall back in November 2017. She denies any dysarthria, visual loss, or word finding difficulties. She complains of bilateral lower extremity weakness, but she states it has been a chronic problem for her. She states that her chest burning is somewhat better in the emergency department but not completely resolved. She denies any present shortness of breath, dizziness, nausea, vomiting. In fact, the patient feels hungry and wants to eat. She denies any coughing, fevers, chills, hemoptysis, medication, melena, dysuria or hematuria.  In the emergency department, the patient was afebrile. Her blood pressure was elevated as high as 250/150. She endorses compliance with all her medications. The patient was  initially started on a cardene drip.  However, with rapid improvement of her systolic blood pressure into the 140s, this was discontinued. She was saturating 97% on room air. Initial troponin was negative. EKG shows sinus rhythm with nonspecific ST changes. Initial CBC was unremarkable. BMP showed a serum creatinine 4.25 which was near her baseline. The remainder of the BMP is unremarkable. The patient was admitted for observation secondary to her dysesthesias and chest pain.    Assessment/Plan: Atypical chest pain -This may be related to the patient's uncontrolled blood pressure -finish cycling troponins -EKG-sinus with nonspecific ST changes -echo -am lipid panel -cxr  L-Upper extremity weakness/upper extremity dysesthesia -strength 5/5 in bilateral UE and LE on exam -physical exam has some functional/nonphysiologic components -+Hoovers sign -MRI brain -B12 -HIV -RPR -CPK -PT eval  Malignant Hypertension -question pt's compliance with meds -continue home doses of carvedilol, hydralazine -Holding lisinopril plus HCTZ in the setting of CKD stage V -Hydralazine when necessary systolic blood pressure > 180  CKD stage V -Baseline creatinine 3.9-4.2 -AM BMP  Diabetes mellitus type 2 -NovoLog sliding scale -Hemoglobin A1c  COPD -stable on RA -albuterol prn dyspnea/wheeze  Anxiety/dpression -continue home dose alprazolam  Sleep apnea Continue CPAPat bedtime    Past Medical History:  Diagnosis Date  . Acid reflux   . Anemia   . Arthritis   . Bipolar 1 disorder ([place])   . Cervical radiculopathy   . COPD (chronic obstructive pulmonary disease) ([place]) 2014   bronchitis  . Degenerative disc disease, thoracic   . Depression   . Diabetes mellitus   . Hypertension   . Noncompliance with medication regimen   .  Obesity (BMI 30-39.9)   . OSA (obstructive sleep apnea)    cpap  . RLS (restless legs syndrome)    Past Surgical History:  Procedure Laterality Date  .  BIOPSY N/A 05/17/2013   Procedure: BIOPSY;  Surgeon: [name], MD;  Location: [facility];  Service: Endoscopy;  Laterality: N/A;  . CHOLECYSTECTOMY N/A 04/27/2014   Procedure: LAPAROSCOPIC CHOLECYSTECTOMY;  Surgeon: [name], MD;  Location: [facility];  Service: General;  Laterality: N/A;  . COLONOSCOPY  06/06/2010   ZOX:WRUEAV bleeding secondary to internal hemorrhoids but incomplete evaluation secondary to poor right colon prep/small rectal and sigmoid colon polyps (hyperplastic). PROPOFOL  . COLONOSCOPY  May 2013   Dr. Gilliam/[facility]: 5 mm a descending colon polyp, hyperplastic. Adequate bowel prep.  . ENDOMETRIAL ABLATION    . ESOPHAGOGASTRODUODENOSCOPY  06/06/2010   WUJ:WJXBJYN erythema and edema of body of stomach, with sessile polypoid lesions. bx benign. no h.pylori  . ESOPHAGOGASTRODUODENOSCOPY (EGD) WITH PROPOFOL N/A 05/17/2013   Dr. Oneida Alar: normal esophagus, moderate nodular gastritis, negative path, empiric Savary dilation  . FLEXIBLE SIGMOIDOSCOPY N/A 05/17/2013   Dr. Oneida Alar: moderate sized internal hemorrhoids  . HEMORRHOID BANDING N/A 05/17/2013   Procedure: HEMORRHOID BANDING;  Surgeon: [name], MD;  Location: [facility];  Service: Endoscopy;  Laterality: N/A;  2 bands placed  . INCISION AND DRAINAGE ABSCESS Left 10/11/2013   Procedure: INCISION AND DRAINAGE AND DEBRIDEMENT LEFT BREAST  ABSCESS;  Surgeon: [name], MD;  Location: [facility];  Service: General;  Laterality: Left;  . IRRIGATION AND DEBRIDEMENT ABSCESS Right 06/01/2013   Procedure: INCISION AND DRAINAGE AND DEBRIDEMENT ABSCESS RIGHT BREAST;  Surgeon: [name], MD;  Location: [facility];  Service: General;  Laterality: Right;  . SAVORY DILATION N/A 05/17/2013   Procedure: SAVORY DILATION;  Surgeon: [name], MD;  Location: [facility];  Service: Endoscopy;  Laterality: N/A;  14/15/16  . TUBAL LIGATION     Social History:  reports that she quit smoking about 9 years ago. Her smoking use included  Cigarettes. She has a 37.50 pack-year smoking history. She has quit using smokeless tobacco. She reports that she does not drink alcohol or use drugs.   Family History  Problem Relation Age of Onset  . Adopted: Yes  . Family history unknown: Yes     Allergies  Allergen Reactions  . Codeine Nausea And Vomiting     Prior to Admission medications   Medication Sig Start Date End Date Taking? Authorizing Provider  albuterol (PROVENTIL HFA;VENTOLIN HFA) 108 (90 Base) MCG/ACT inhaler Inhale 2 puffs into the lungs every 4 (four) hours as needed for wheezing or shortness of breath. 07/23/16  Yes Francine Graven, DO  albuterol (PROVENTIL) (2.5 MG/3ML) 0.083% nebulizer solution Take 2.5 mg by nebulization every 6 (six) hours as needed for wheezing or shortness of breath.    Yes Historical Provider, MD  ALPRAZolam Duanne Moron) 1 MG tablet Take 1 mg by mouth 4 (four) times daily.     Yes Historical Provider, MD  atorvastatin (LIPITOR) 10 MG tablet Take 10 mg by mouth daily. Take along with 20 mg tablet to equal 30 mg daily.   Yes Historical Provider, MD  atorvastatin (LIPITOR) 20 MG tablet Take 20 mg by mouth daily. Take along with 10 mg tablet to equal 30 mg daily.   Yes Historical Provider, MD  baclofen (LIORESAL) 20 MG tablet Take 20 mg by mouth 3 (three) times daily as needed for muscle spasms.   Yes  Historical Provider, MD  carvedilol (COREG) 12.5 MG tablet Take 1 tablet (12.5 mg total) by mouth 2 (two) times daily with a meal. 05/26/16  Yes Cherene Altes, MD  fluticasone [facility]) 50 MCG/ACT nasal spray Place 2 sprays into both nostrils at bedtime.   Yes Historical Provider, MD  furosemide (LASIX) 20 MG tablet Take 20 mg by mouth daily as needed for edema.   Yes Historical Provider, MD  hydrALAZINE (APRESOLINE) 10 MG tablet Take 1 tablet (10 mg total) by mouth every 8 (eight) hours. 05/26/16  Yes Cherene Altes, MD  HYDROcodone-acetaminophen (NORCO/VICODIN) 5-325 MG tablet Take 1 tablet by  mouth every 4 (four) hours as needed for moderate pain.   Yes Historical Provider, MD  lisinopril-hydrochlorothiazide (PRINZIDE,ZESTORETIC) 20-12.5 MG tablet Take 1 tablet by mouth daily.   Yes Historical Provider, MD  loperamide (IMODIUM) 2 MG capsule Take 1 capsule (2 mg total) by mouth 4 (four) times daily as needed for diarrhea or loose stools. 10/23/16  Yes Evalee Jefferson, PA-C  metoCLOPramide (REGLAN) 10 MG tablet Take 1 tablet (10 mg total) by mouth every 6 (six) hours as needed for nausea (or headache). 4/0/98  Yes Delora Fuel, MD  predniSONE (DELTASONE) 10 MG tablet Take 6 tablets day one, 5 tablets day two, 4 tablets day three, 3 tablets day four, 2 tablets day five, then 1 tablet day six 10/23/16  Yes Evalee Jefferson, PA-C  rOPINIRole (REQUIP) 1 MG tablet Take 1 mg by mouth at bedtime.    Yes Historical Provider, MD    Review of Systems:  Constitutional:  No weight loss, night sweats, Fevers,  fatigue.  Head&Eyes: No headache.  No vision loss.  No eye pain or scotoma ENT:  No Difficulty swallowing,Tooth/dental problems,Sore throat,  No ear ache, post nasal drip,  Cardio-vascular:  No chest pain, Orthopnea, PND, swelling in lower extremities,  dizziness, palpitations  GI:  No  abdominal pain, vomiting, diarrhea, loss of appetite, hematochezia, melena, heartburn, indigestion, Resp:  . No cough. No coughing up of blood .No wheezing.No chest wall deformity  Skin:  no rash or lesions.  GU:  no dysuria, change in color of urine, no urgency or frequency. No flank pain.  Musculoskeletal:  No joint pain or swelling. No decreased range of motion. No back pain.  Psych:  No change in mood or affect. No depression or anxiety. Neurologic: No  dysesthesia, no focal weakness, no vision loss. No syncope  Physical Exam: Vitals:   10/27/16 0809 10/27/16 0810 10/27/16 0906 10/27/16 1000  BP:  (!) 250/150 (!) 165/100 139/80  Pulse:    (!) 105  Resp: 12   17  Temp:      TempSrc:      SpO2:    96%    Weight:      Height:       General:  A&O x 3, NAD, nontoxic, pleasant/cooperative Head/Eye: No conjunctival hemorrhage, no icterus, [place]/AT, No nystagmus ENT:  No icterus,  No thrush, good dentition, no pharyngeal exudate Neck:  No masses, no lymphadenpathy, no bruits CV:  RRR, no rub, no gallop, no S3 Lung:  CTAB, good air movement, no wheeze, no rhonchi Abdomen: soft/NT, +BS, nondistended, no peritoneal signs Ext: No cyanosis, No rashes, No petechiae, No lymphangitis, No edema Neuro: CNII-XII intact, strength 5/5 in bilateral upper and lower extremities, no dysmetria  Labs on Admission:  Basic Metabolic Panel:  Recent Labs Lab 10/23/16 0909 10/27/16 0621  NA 140 141  K 4.4 4.7  CL 108 107  CO2 22 22  GLUCOSE 121* 146*  BUN 64* 54*  CREATININE 5.21* 4.25*  CALCIUM 9.5 9.7   Liver Function Tests:  Recent Labs Lab 10/27/16 0621  AST 14*  ALT 24  ALKPHOS 83  BILITOT 0.5  PROT 7.6  ALBUMIN 3.8   No results for input(s): LIPASE, AMYLASE in the last 168 hours. No results for input(s): AMMONIA in the last 168 hours. CBC:  Recent Labs Lab 10/23/16 0909 10/27/16 0621  WBC 9.3 7.7  NEUTROABS 3.5 5.9  HGB 12.2 12.3  HCT 37.0 37.6  MCV 85.5 84.9  PLT 158 194   Coagulation Profile: No results for input(s): INR, PROTIME in the last 168 hours. Cardiac Enzymes:  Recent Labs Lab 10/23/16 0909 10/27/16 0621  TROPONINI <0.03 <0.03   BNP: Invalid input(s): POCBNP CBG: No results for input(s): GLUCAP in the last 168 hours. Urine analysis:    Component Value Date/Time   COLORURINE YELLOW 10/27/2016 0740   APPEARANCEUR HAZY (A) 10/27/2016 0740   LABSPEC 1.011 10/27/2016 0740   PHURINE 6.0 10/27/2016 0740   GLUCOSEU 50 (A) 10/27/2016 0740   HGBUR NEGATIVE 10/27/2016 0740   BILIRUBINUR NEGATIVE 10/27/2016 0740   KETONESUR NEGATIVE 10/27/2016 0740   PROTEINUR >=300 (A) 10/27/2016 0740   UROBILINOGEN 0.2 03/17/2015 2236   NITRITE NEGATIVE 10/27/2016 0740    LEUKOCYTESUR NEGATIVE 10/27/2016 0740   Sepsis Labs: @LABRCNTIP (procalcitonin:4,lacticidven:4) )No results found for this or any previous visit (from the past 240 hour(s)).   Radiological Exams on Admission: Ct Head Wo Contrast  Result Date: 10/27/2016 CLINICAL DATA:  Headache, chest pain, bilateral hand numbness EXAM: CT HEAD WITHOUT CONTRAST TECHNIQUE: Contiguous axial images were obtained from the base of the skull through the vertex without intravenous contrast. COMPARISON:  06/21/2015 FINDINGS: Brain: No evidence of acute infarction, hemorrhage, hydrocephalus, extra-axial collection or mass lesion/mass effect. Vascular: No hyperdense vessel or unexpected calcification. Skull: Normal. Negative for fracture or focal lesion. Sinuses/Orbits: No acute finding. Other: None. IMPRESSION: Normal head CT. Electronically Signed   By: Julian Hy M.D.   On: 10/27/2016 09:00    EKG: Independently reviewed. Sinus rhythm, nonspecific ST changes    Time spent:60 minutes Code Status:   FULL Family Communication:  Spouse updated at bedside Disposition Plan: expect 1-2 day hospitalization Consults called: none DVT Prophylaxis: [place] Lovenox  Arnita Koons, DO  [facility] Pager [phone]  If 7PM-7AM, please contact night-coverage [email or web address] Password [facility] 10/27/2016, 10:47 AM

## 2016-10-27 NOTE — Progress Notes (Signed)
*  PRELIMINARY RESULTS* Echocardiogram 2D Echocardiogram has been performed.  Martha [name] 10/27/2016, 4:33 PM

## 2016-10-27 NOTE — Evaluation (Signed)
Physical Therapy Evaluation Patient Details Name: Martha [name] MRN: [id number] DOB: 13-Apr-1966 Today's Date: 10/27/2016   History of Present Illness  51 year old female with a history of CKD stage V, COPD, hypertension, diabetes mellitus, OSA, restless leg syndrome presents with one-day history of burning in her chest that began around midnight on 10/27/2016. When her symptoms persisted, she presented to the emergency department for further evaluation. She complained of some associated shortness of breath without any dizziness or vomiting although she did have some nausea. She denies any recent long travels or surgeries or trauma. There are no exacerbating or alleviating factors. In addition, the patient has been complaining of bilateral forearm numbness and tingling from her elbows to her fingers. This has been present for the past week, but was worsened last evening when she had her associated chest burning. She states that she has had this numbness and tingling in her forearms in the past. She complains also of left upper extremity weakness, but states this has been present for many months since her fall back in November 2017. She denies any dysarthria, visual loss, or word finding difficulties. She complains of bilateral lower extremity weakness, but she states it has been a chronic problem for her. She states that her chest burning is somewhat better in the emergency department but not completely resolved. She denies any present shortness of breath, dizziness, nausea, vomiting. In fact, the patient feels hungry and wants to eat. She denies any coughing, fevers, chills, hemoptysis, medication, melena, dysuria or hematuria.  In the emergency department, the patient was afebrile. Her blood pressure was elevated as high as 250/150. She endorses compliance with all her medications. The patient was initially started on a cardene drip.  However, with rapid improvement of her systolic blood pressure into the  140s, this was discontinued. She was saturating 97% on room air. Initial troponin was negative. EKG shows sinus rhythm with nonspecific ST changes. Initial CBC was unremarkable. BMP showed a serum creatinine 4.25 which was near her baseline. The remainder of the BMP is unremarkable. The patient was admitted for observation secondary to her dysesthesias and chest pain.      Clinical Impression  Pt received in bed, fiance present, and pt is agreeable to PT evaluation.  Upon chart review it was also noted in her that she has been in the ED several times in the past few months, and has a history of non-compliance.  She was also seen by OP OT on 06/26/2015 for L UE radicular pain, and this seems to be a chronic issue with her.  Pt states she uses a cane and a w/c for mobilization at times.  However, when she is in the house she does not use any DME.  She is independent with dressing and bathing.  During PT evaluation she demonstrates good coordination with B UE's, and reports that the numbness/tingling has resolved.  She ambulated 13ft independently.  She reports 2 falls in the past 6 months where she has tripped on things.  She does not demonstrate need for skilled PT at this time, therefore, will sign off.      Follow Up Recommendations No PT follow up    Equipment Recommendations  None recommended by PT    Recommendations for Other Services       Precautions / Restrictions Precautions Precautions: Fall Precaution Comments: 2 falls - Oct 29 was walking into the house and she stumbled, The other time she was goign to the bathroom, and tripped on  her flip flop bedroom shoes.  Restrictions Weight Bearing Restrictions: No      Mobility  Bed Mobility Overal bed mobility: Independent                Transfers Overall transfer level: Independent                  Ambulation/Gait Ambulation/Gait assistance: Independent Ambulation Distance (Feet): 80 Feet Assistive device: None Gait  Pattern/deviations: WFL(Within Functional Limits);Antalgic   Gait velocity interpretation: <1.8 ft/sec, indicative of risk for recurrent falls    Stairs            Wheelchair Mobility    Modified Rankin (Stroke Patients Only)       Balance Overall balance assessment: History of Falls;Independent                                           Pertinent Vitals/Pain Pain Assessment: No/denies pain    Home Living Family/patient expects to be discharged to:: Private residence Living Arrangements: Spouse/significant other;Children (dtr) Available Help at Discharge: Available 24 hours/day Type of Home: House Home Access: Stairs to enter   [employer] of Steps: 1 step Home Layout: One level Home Equipment: Shower seat;Grab bars - tub/shower;Toilet riser Additional Comments: Pt has acces to RW, cane, w/c, but states they are not hers, she is just borrowing them. .    Prior Function Level of Independence: Independent with assistive device(s)   Gait / Transfers Assistance Needed: pt uses w/c and cane to get around with.  No device when ambulating in the house.    ADL's / Homemaking Assistance Needed: independent         Hand Dominance   Dominant Hand: Right    Extremity/Trunk Assessment   Upper Extremity Assessment Upper Extremity Assessment: Overall WFL for tasks assessed (Pt demonstrates excellent fine motor with B finger to thumb tasks, and states that her numbness/tingling has resolved. )    Lower Extremity Assessment Lower Extremity Assessment: Overall WFL for tasks assessed       Communication   Communication: No difficulties  Cognition Arousal/Alertness: Awake/alert Behavior During Therapy: WFL for tasks assessed/performed Overall Cognitive Status: Within Functional Limits for tasks assessed (Pt states she was hurting from abdominal and up, and arms were numb)                                        General  Comments      Exercises     Assessment/Plan    PT Assessment Patent does not need any further PT services  PT Problem List         PT Treatment Interventions      PT Goals (Current goals can be found in the Care Plan section)  Acute Rehab PT Goals Patient Stated Goal: to go home PT Goal Formulation: All assessment and education complete, DC therapy    Frequency     Barriers to discharge        Co-evaluation               End of Session Equipment Utilized During Treatment: Gait belt Activity Tolerance: Patient tolerated treatment well Patient left: in bed;with call bell/phone within reach;with family/visitor present Nurse Communication: Mobility status (mobiltiy sheet left in the room. ) PT Visit Diagnosis:  Muscle weakness (generalized) (M62.81);History of falling (Z91.81)    Time: 4604-7998 PT Time Calculation (min) (ACUTE ONLY): 16 min   Charges:   PT Evaluation $PT Eval Low Complexity: 1 Procedure     PT G Codes:   PT G-Codes **NOT FOR INPATIENT CLASS** Functional Assessment Tool Used: AM-PAC 6 Clicks Basic Mobility;Clinical judgement Functional Limitation: Mobility: Walking and moving around Mobility: Walking and Moving Around Current Status (X2158): 0 percent impaired, limited or restricted Mobility: Walking and Moving Around Goal Status (N2761): 0 percent impaired, limited or restricted Mobility: Walking and Moving Around Discharge Status (O4859): 0 percent impaired, limited or restricted    Beth Shaquina Gillham, PT, DPT X: [phone]

## 2016-10-27 NOTE — ED Notes (Signed)
Pt taken to CT.

## 2016-10-27 NOTE — ED Provider Notes (Addendum)
Level V caveat for urgent need for intervention.  [facility] DEPT Provider Note   CSN: [id number] Arrival date & time: 10/27/16  9563     History   Chief Complaint Chief Complaint  Patient presents with  . Extremity Weakness    HPI Martha [name] is a 51 y.o. female.  Level V caveat for urgent need for intervention. Patient complains of bilateral hand/forearm numbness for 1 week, getting worse last night. Review systems positive for headache. No chest pain, dyspnea, fever, sweats, chills.  She has multiple health problems including obesity, hypertension, diabetes, COPD, kidney disease, mental health issues.      Past Medical History:  Diagnosis Date  . Acid reflux   . Anemia   . Arthritis   . Bipolar 1 disorder ([place])   . Cervical radiculopathy   . COPD (chronic obstructive pulmonary disease) ([place]) 2014   bronchitis  . Degenerative disc disease, thoracic   . Depression   . Diabetes mellitus   . Hypertension   . Noncompliance with medication regimen   . Obesity (BMI 30-39.9)   . OSA (obstructive sleep apnea)    cpap  . RLS (restless legs syndrome)     Patient Active Problem List   Diagnosis Date Noted  . Acute on chronic renal failure ([place]) 05/25/2016  . Abdominal pain 04/26/2014  . Cholelithiasis 04/26/2014  . Acute renal failure ([place]) 04/26/2014  . Dehydration 04/26/2014  . Hyponatremia 04/26/2014  . Gallstones 04/06/2014  . Uncontrolled diabetes mellitus ([place]) 10/09/2013  . Breast abscess 05/30/2013  . Diabetes mellitus, type 2 ([place]) 05/30/2013  . Hyperglycemia 05/30/2013  . Internal hemorrhoids with other complication 87/56/4332  . Umbilical hernia without mention of obstruction or gangrene 04/15/2013  . Anemia 04/15/2013  . DM 05/13/2010  . Hyperlipidemia 05/13/2010  . OBESITY 05/13/2010  . Depression 05/13/2010  . RESTLESS LEG SYNDROME 05/13/2010  . Essential hypertension 05/13/2010  . GERD 05/13/2010  . RECTAL BLEEDING 05/13/2010  . Sleep  apnea 05/13/2010  . Dysphagia, oropharyngeal phase 05/13/2010    Past Surgical History:  Procedure Laterality Date  . BIOPSY N/A 05/17/2013   Procedure: BIOPSY;  Surgeon: [name], MD;  Location: [facility];  Service: Endoscopy;  Laterality: N/A;  . CHOLECYSTECTOMY N/A 04/27/2014   Procedure: LAPAROSCOPIC CHOLECYSTECTOMY;  Surgeon: [name], MD;  Location: [facility];  Service: General;  Laterality: N/A;  . COLONOSCOPY  06/06/2010   RJJ:OACZYS bleeding secondary to internal hemorrhoids but incomplete evaluation secondary to poor right colon prep/small rectal and sigmoid colon polyps (hyperplastic). PROPOFOL  . COLONOSCOPY  May 2013   Dr. Gilliam/[facility]: 5 mm a descending colon polyp, hyperplastic. Adequate bowel prep.  . ENDOMETRIAL ABLATION    . ESOPHAGOGASTRODUODENOSCOPY  06/06/2010   AYT:KZSWFUX erythema and edema of body of stomach, with sessile polypoid lesions. bx benign. no h.pylori  . ESOPHAGOGASTRODUODENOSCOPY (EGD) WITH PROPOFOL N/A 05/17/2013   Dr. Oneida Alar: normal esophagus, moderate nodular gastritis, negative path, empiric Savary dilation  . FLEXIBLE SIGMOIDOSCOPY N/A 05/17/2013   Dr. Oneida Alar: moderate sized internal hemorrhoids  . HEMORRHOID BANDING N/A 05/17/2013   Procedure: HEMORRHOID BANDING;  Surgeon: [name], MD;  Location: [facility];  Service: Endoscopy;  Laterality: N/A;  2 bands placed  . INCISION AND DRAINAGE ABSCESS Left 10/11/2013   Procedure: INCISION AND DRAINAGE AND DEBRIDEMENT LEFT BREAST  ABSCESS;  Surgeon: [name], MD;  Location: [facility];  Service: General;  Laterality: Left;  . IRRIGATION AND DEBRIDEMENT ABSCESS Right 06/01/2013   Procedure:  INCISION AND DRAINAGE AND DEBRIDEMENT ABSCESS RIGHT BREAST;  Surgeon: [name], MD;  Location: [facility];  Service: General;  Laterality: Right;  . SAVORY DILATION N/A 05/17/2013   Procedure: SAVORY DILATION;  Surgeon: [name], MD;  Location: [facility];  Service: Endoscopy;  Laterality: N/A;   14/15/16  . TUBAL LIGATION      OB History    Gravida Para Term Preterm AB Living   2 2 1 1   2    SAB TAB Ectopic Multiple Live Births                   Home Medications    Prior to Admission medications   Medication Sig Start Date End Date Taking? Authorizing Provider  albuterol (PROVENTIL HFA;VENTOLIN HFA) 108 (90 Base) MCG/ACT inhaler Inhale 2 puffs into the lungs every 4 (four) hours as needed for wheezing or shortness of breath. 07/23/16  Yes Francine Graven, DO  albuterol (PROVENTIL) (2.5 MG/3ML) 0.083% nebulizer solution Take 2.5 mg by nebulization every 6 (six) hours as needed for wheezing or shortness of breath.    Yes Historical Provider, MD  ALPRAZolam Duanne Moron) 1 MG tablet Take 1 mg by mouth 4 (four) times daily.     Yes Historical Provider, MD  atorvastatin (LIPITOR) 10 MG tablet Take 10 mg by mouth daily. Take along with 20 mg tablet to equal 30 mg daily.   Yes Historical Provider, MD  atorvastatin (LIPITOR) 20 MG tablet Take 20 mg by mouth daily. Take along with 10 mg tablet to equal 30 mg daily.   Yes Historical Provider, MD  baclofen (LIORESAL) 20 MG tablet Take 20 mg by mouth 3 (three) times daily as needed for muscle spasms.   Yes Historical Provider, MD  carvedilol (COREG) 12.5 MG tablet Take 1 tablet (12.5 mg total) by mouth 2 (two) times daily with a meal. 05/26/16  Yes Cherene Altes, MD  fluticasone (FLONASE) 50 MCG/ACT nasal spray Place 2 sprays into both nostrils at bedtime.   Yes Historical Provider, MD  furosemide (LASIX) 20 MG tablet Take 20 mg by mouth daily as needed for edema.   Yes Historical Provider, MD  hydrALAZINE (APRESOLINE) 10 MG tablet Take 1 tablet (10 mg total) by mouth every 8 (eight) hours. 05/26/16  Yes Cherene Altes, MD  HYDROcodone-acetaminophen (NORCO/VICODIN) 5-325 MG tablet Take 1 tablet by mouth every 4 (four) hours as needed for moderate pain.   Yes Historical Provider, MD  lisinopril-hydrochlorothiazide (PRINZIDE,ZESTORETIC)  20-12.5 MG tablet Take 1 tablet by mouth daily.   Yes Historical Provider, MD  loperamide (IMODIUM) 2 MG capsule Take 1 capsule (2 mg total) by mouth 4 (four) times daily as needed for diarrhea or loose stools. 10/23/16  Yes Evalee Jefferson, PA-C  metoCLOPramide (REGLAN) 10 MG tablet Take 1 tablet (10 mg total) by mouth every 6 (six) hours as needed for nausea (or headache). 03/05/20  Yes Delora Fuel, MD  predniSONE (DELTASONE) 10 MG tablet Take 6 tablets day one, 5 tablets day two, 4 tablets day three, 3 tablets day four, 2 tablets day five, then 1 tablet day six 10/23/16  Yes Evalee Jefferson, PA-C  rOPINIRole (REQUIP) 1 MG tablet Take 1 mg by mouth at bedtime.    Yes Historical Provider, MD    Family History Family History  Problem Relation Age of Onset  . Adopted: Yes  . Family history unknown: Yes    Social History Social History  Substance Use Topics  . Smoking status:  Former Smoker    Packs/day: 1.50    Years: 25.00    Types: Cigarettes    Quit date: 10/23/2007  . Smokeless tobacco: Former Systems developer     Comment: Quit x 6 years ago  . Alcohol use No     Allergies   Codeine   Review of Systems Review of Systems  Reason unable to perform ROS: Urgent need for intervention.     Physical Exam Updated Vital Signs BP 139/80   Pulse (!) 105   Temp 98 F (36.7 C) (Oral)   Resp 17   Ht 5\' 8"  (1.727 m)   Wt 280 lb (127 kg)   SpO2 96%   BMI 42.57 kg/m   Physical Exam  Constitutional: She is oriented to person, place, and time.  Blood pressure grossly elevated  HENT:  Head: Normocephalic and atraumatic.  Eyes: Conjunctivae are normal.  Neck: Neck supple.  Cardiovascular: Normal rate and regular rhythm.   Pulmonary/Chest: Effort normal and breath sounds normal.  Abdominal: Soft. Bowel sounds are normal.  Musculoskeletal: Normal range of motion.  Neurological: She is alert and oriented to person, place, and time.  When asked to raise arms, her forearm and hand appeared to be flapping   Skin: Skin is warm and dry.  Psychiatric: She has a normal mood and affect. Her behavior is normal.  Nursing note and vitals reviewed.    ED Treatments / Results  Labs (all labs ordered are listed, but only abnormal results are displayed) Labs Reviewed  COMPREHENSIVE METABOLIC PANEL - Abnormal; Notable for the following:       Result Value   Glucose, Bld 146 (*)    BUN 54 (*)    Creatinine, Ser 4.25 (*)    AST 14 (*)    GFR calc non Af Amer 11 (*)    GFR calc Af Amer 13 (*)    All other components within normal limits  URINALYSIS, ROUTINE W REFLEX MICROSCOPIC - Abnormal; Notable for the following:    APPearance HAZY (*)    Glucose, UA 50 (*)    Protein, ur >=300 (*)    Bacteria, UA RARE (*)    Squamous Epithelial / LPF 6-30 (*)    All other components within normal limits  RAPID URINE DRUG SCREEN, HOSP PERFORMED - Abnormal; Notable for the following:    Opiates POSITIVE (*)    Benzodiazepines POSITIVE (*)    All other components within normal limits  CBC WITH DIFFERENTIAL/PLATELET  TROPONIN I    EKG  EKG Interpretation  Date/Time:  Monday October 27 2016 05:45:38 EDT Ventricular Rate:  97 PR Interval:    QRS Duration: 72 QT Interval:  327 QTC Calculation: 416 R Axis:   42 Text Interpretation:  Sinus rhythm Borderline repol abnormality, diffuse leads Since last tracing rate faster 23 Oct 2016 Confirmed by KNAPP  MD-I, IVA ([id number]) on 10/27/2016 6:34:20 AM       Radiology Ct Head Wo Contrast  Result Date: 10/27/2016 CLINICAL DATA:  Headache, chest pain, bilateral hand numbness EXAM: CT HEAD WITHOUT CONTRAST TECHNIQUE: Contiguous axial images were obtained from the base of the skull through the vertex without intravenous contrast. COMPARISON:  06/21/2015 FINDINGS: Brain: No evidence of acute infarction, hemorrhage, hydrocephalus, extra-axial collection or mass lesion/mass effect. Vascular: No hyperdense vessel or unexpected calcification. Skull: Normal. Negative for  fracture or focal lesion. Sinuses/Orbits: No acute finding. Other: None. IMPRESSION: Normal head CT. Electronically Signed   By: Henderson Newcomer.D.  On: 10/27/2016 09:00    Procedures Procedures (including critical care time)  Medications Ordered in ED Medications  cloNIDine (CATAPRES) tablet 0.2 mg (0.2 mg Oral Given 10/27/16 0721)  nicardipine (CARDENE) 20mg  in 0.86% saline 239ml IV infusion (0.1 mg/ml) (0 mg/hr Intravenous Paused 10/27/16 1006)     Initial Impression / Assessment and Plan / ED Course  I have reviewed the triage vital signs and the nursing notes.  Pertinent labs & imaging results that were available during my care of the patient were reviewed by me and considered in my medical decision making (see chart for details).    Patient has an extremely high BP. Uncertain etiology of bilateral forearm and hand numbness/flapping motion.  CT head neg.  Will rx po Clonidine and start Cardene drip.  Admit   Final Clinical Impressions(s) / ED Diagnoses   Final diagnoses:  Hypertension, unspecified type    New Prescriptions New Prescriptions   No medications on file     Nat Christen, MD 10/27/16 [place], MD 10/27/16 1019

## 2016-10-27 NOTE — ED Notes (Addendum)
Verbal order from Dr. Lacinda Axon to pause cardene drip due to 139/80 bp.   IV tubing disconnected and IV line flushed. MD also notified of pt having heartburn.

## 2016-10-27 NOTE — ED Triage Notes (Signed)
Pt reports feeling like both hands are numb. Pt c/o headache and chest pain. Pt states the symptoms started right before she went to bed last night around 11 pm. Pt states she just feels bad from her abdomen up.

## 2016-10-27 NOTE — ED Notes (Signed)
Pt reports to this RN, "Cecille Rubin, I know I'm dying."  When this RN asked why she thought this, she stated, "I know something is wrong with me, I just know it."  Pt became tearful when she asked what her BP was and began getting nauseated and shaking.

## 2016-10-28 DIAGNOSIS — R079 Chest pain, unspecified: Secondary | ICD-10-CM | POA: Diagnosis not present

## 2016-10-28 DIAGNOSIS — N185 Chronic kidney disease, stage 5: Secondary | ICD-10-CM | POA: Diagnosis not present

## 2016-10-28 DIAGNOSIS — I10 Essential (primary) hypertension: Secondary | ICD-10-CM | POA: Diagnosis not present

## 2016-10-28 LAB — HEMOGLOBIN A1C
Hgb A1c MFr Bld: 5.9 % — ABNORMAL HIGH (ref 4.8–5.6)
Mean Plasma Glucose: 123 mg/dL

## 2016-10-28 LAB — TROPONIN I: Troponin I: <0.03 ng/mL (ref <0.03)

## 2016-10-28 LAB — LIPID PANEL
Cholesterol: 95 mg/dL (ref 0–200)
HDL: 31 mg/dL — AB (ref >40)
LDL CALC: 40 mg/dL (ref 0–99)
Total CHOL/HDL Ratio: 3.1 RATIO
Triglycerides: 122 mg/dL (ref <150)
VLDL: 24 mg/dL (ref 0–40)

## 2016-10-28 LAB — BASIC METABOLIC PANEL
ANION GAP: 7 (ref 5–15)
BUN: 60 mg/dL — AB (ref 6–20)
CALCIUM: 9 mg/dL (ref 8.9–10.3)
CO2: 22 mmol/L (ref 22–32)
CREATININE: 4.43 mg/dL — AB (ref 0.44–1.00)
Chloride: 112 mmol/L — ABNORMAL HIGH (ref 101–111)
GFR calc Af Amer: 13 mL/min — ABNORMAL LOW (ref >60)
GFR, EST NON AFRICAN AMERICAN: 11 mL/min — AB (ref >60)
GLUCOSE: 92 mg/dL (ref 65–99)
Potassium: 4.4 mmol/L (ref 3.5–5.1)
Sodium: 141 mmol/L (ref 135–145)

## 2016-10-28 LAB — GLUCOSE, CAPILLARY
GLUCOSE-CAPILLARY: 96 mg/dL (ref 65–99)
GLUCOSE-CAPILLARY: 119 mg/dL — AB (ref 65–99)
Glucose-Capillary: 130 mg/dL — ABNORMAL HIGH (ref 65–99)
Glucose-Capillary: 110 mg/dL — ABNORMAL HIGH (ref 65–99)

## 2016-10-28 LAB — HIV ANTIBODY (ROUTINE TESTING W REFLEX): HIV Screen 4th Generation wRfx: NONREACTIVE

## 2016-10-28 LAB — RPR: RPR: NONREACTIVE

## 2016-10-28 MED ORDER — ALBUTEROL SULFATE (2.5 MG/3ML) 0.083% IN NEBU
2.5000 mg | INHALATION_SOLUTION | Freq: Three times a day (TID) | RESPIRATORY_TRACT | Status: DC
Start: 2016-10-28 — End: 2016-10-29
  Administered 2016-10-28 (×2): 2.5 mg via RESPIRATORY_TRACT
  Filled 2016-10-28 (×3): qty 3

## 2016-10-28 MED ORDER — COLCHICINE 0.6 MG PO TABS
0.6000 mg | ORAL_TABLET | Freq: Two times a day (BID) | ORAL | Status: DC
  Administered 2016-10-28 – 2016-10-29 (×3): 0.6 mg via ORAL
  Filled 2016-10-28 (×3): qty 1

## 2016-10-28 MED ORDER — PAROXETINE HCL 20 MG PO TABS: ORAL_TABLET | ORAL | 12 refills | Status: DC

## 2016-10-28 MED ORDER — REGADENOSON 0.4 MG/5ML IV SOLN
0.4000 mg | Freq: Once | INTRAVENOUS | Status: AC
  Administered 2016-10-29: 0.4 mg via INTRAVENOUS
  Filled 2016-10-28: qty 5

## 2016-10-28 MED ORDER — HYDRALAZINE HCL 25 MG PO TABS
25.0000 mg | ORAL_TABLET | Freq: Three times a day (TID) | ORAL | Status: DC
  Administered 2016-10-28 – 2016-10-29 (×3): 25 mg via ORAL
  Filled 2016-10-28 (×3): qty 1

## 2016-10-28 NOTE — Progress Notes (Signed)
Subjective: She came to the hospital with what appears to be atypical chest pain. She thinks she is having panic attacks and I think that certainly is a possibility. She has severe diabetes which has not been well controlled at baseline and she's now developing increasing problems with diabetic complications including renal failure and she's actually approaching dialysis now. She had a history of heart failure in the past. She is now seeing an endocrinologist after being resistant to doing that for several years. She is also followed by nephrology  Objective: Vital signs in last 24 hours: Temp:  [98.2 F (36.8 C)-98.5 F (36.9 C)] 98.3 F (36.8 C) (04/03 8937) Pulse Rate:  [79-105] 79 (04/03 0638) Resp:  [17-20] 18 (04/03 3428) BP: (139-166)/(80-106) 166/101 (04/03 7681) SpO2:  [95 %-98 %] 95 % (04/03 0826) Weight change:  Last BM Date: 10/26/16  Intake/Output from previous day: 04/02 0701 - 04/03 0700 In: 69.2 [I.V.:69.2] Out: 600 [Urine:600]  PHYSICAL EXAM General appearance: alert, cooperative, moderate distress and morbidly obese Resp: clear to auscultation bilaterally Cardio: regular rate and rhythm, S1, S2 normal, no murmur, click, rub or gallop GI: soft, non-tender; bowel sounds normal; no masses,  no organomegaly Extremities: extremities normal, atraumatic, no cyanosis or edema Skin warm and dry. Mucous membranes are moist  Lab Results:  Results for orders placed or performed during the hospital encounter of 10/27/16 (from the past 48 hour(s))  CBC with Differential     Status: None   Collection Time: 10/27/16  6:21 AM  Result Value Ref Range   WBC 7.7 4.0 - 10.5 K/uL   RBC 4.43 3.87 - 5.11 MIL/uL   Hemoglobin 12.3 12.0 - 15.0 g/dL   HCT 37.6 36.0 - 46.0 %   MCV 84.9 78.0 - 100.0 fL   MCH 27.8 26.0 - 34.0 pg   MCHC 32.7 30.0 - 36.0 g/dL   RDW 14.9 11.5 - 15.5 %   Platelets 194 150 - 400 K/uL   Neutrophils Relative % 77 %   Neutro Abs 5.9 1.7 - 7.7 K/uL   Lymphocytes Relative 19 %   Lymphs Abs 1.4 0.7 - 4.0 K/uL   Monocytes Relative 4 %   Monocytes Absolute 0.3 0.1 - 1.0 K/uL   Eosinophils Relative 0 %   Eosinophils Absolute 0.0 0.0 - 0.7 K/uL   Basophils Relative 0 %   Basophils Absolute 0.0 0.0 - 0.1 K/uL  Comprehensive metabolic panel     Status: Abnormal   Collection Time: 10/27/16  6:21 AM  Result Value Ref Range   Sodium 141 135 - 145 mmol/L   Potassium 4.7 3.5 - 5.1 mmol/L   Chloride 107 101 - 111 mmol/L   CO2 22 22 - 32 mmol/L   Glucose, Bld 146 (H) 65 - 99 mg/dL   BUN 54 (H) 6 - 20 mg/dL   Creatinine, Ser 4.25 (H) 0.44 - 1.00 mg/dL   Calcium 9.7 8.9 - 10.3 mg/dL   Total Protein 7.6 6.5 - 8.1 g/dL   Albumin 3.8 3.5 - 5.0 g/dL   AST 14 (L) 15 - 41 U/L   ALT 24 14 - 54 U/L   Alkaline Phosphatase 83 38 - 126 U/L   Total Bilirubin 0.5 0.3 - 1.2 mg/dL   GFR calc non Af Amer 11 (L) >60 mL/min   GFR calc Af Amer 13 (L) >60 mL/min    Comment: (NOTE) The eGFR has been calculated using the CKD EPI equation. This calculation has not been validated in  all clinical situations. eGFR's persistently <60 mL/min signify possible Chronic Kidney Disease.    Anion gap 12 5 - 15  Troponin I     Status: None   Collection Time: 10/27/16  6:21 AM  Result Value Ref Range   Troponin I <0.03 <0.03 ng/mL  Urinalysis, Routine w reflex microscopic     Status: Abnormal   Collection Time: 10/27/16  7:40 AM  Result Value Ref Range   Color, Urine YELLOW YELLOW   APPearance HAZY (A) CLEAR   Specific Gravity, Urine 1.011 1.005 - 1.030   pH 6.0 5.0 - 8.0   Glucose, UA 50 (A) NEGATIVE mg/dL   Hgb urine dipstick NEGATIVE NEGATIVE   Bilirubin Urine NEGATIVE NEGATIVE   Ketones, ur NEGATIVE NEGATIVE mg/dL   Protein, ur >=300 (A) NEGATIVE mg/dL   Nitrite NEGATIVE NEGATIVE   Leukocytes, UA NEGATIVE NEGATIVE   RBC / HPF 0-5 0 - 5 RBC/hpf   WBC, UA 0-5 0 - 5 WBC/hpf   Bacteria, UA RARE (A) NONE SEEN   Squamous Epithelial / LPF 6-30 (A) NONE SEEN    Hyaline Casts, UA PRESENT   Urine rapid drug screen (hosp performed)     Status: Abnormal   Collection Time: 10/27/16  7:40 AM  Result Value Ref Range   Opiates POSITIVE (A) NONE DETECTED   Cocaine NONE DETECTED NONE DETECTED   Benzodiazepines POSITIVE (A) NONE DETECTED   Amphetamines NONE DETECTED NONE DETECTED   Tetrahydrocannabinol NONE DETECTED NONE DETECTED   Barbiturates NONE DETECTED NONE DETECTED    Comment:        DRUG SCREEN FOR MEDICAL PURPOSES ONLY.  IF CONFIRMATION IS NEEDED FOR ANY PURPOSE, NOTIFY LAB WITHIN 5 DAYS.        LOWEST DETECTABLE LIMITS FOR URINE DRUG SCREEN Drug Class       Cutoff (ng/mL) Amphetamine      1000 Barbiturate      200 Benzodiazepine   373 Tricyclics       578 Opiates          300 Cocaine          300 THC              50   Folate     Status: None   Collection Time: 10/27/16 12:45 PM  Result Value Ref Range   Folate 7.9 >5.9 ng/mL    Comment: Performed at [facility], [place] [address] [place], [place] [address]  HIV antibody (Routine Testing)     Status: None   Collection Time: 10/27/16 12:46 PM  Result Value Ref Range   HIV Screen 4th Generation wRfx Non Reactive Non Reactive    Comment: (NOTE) Performed At: [facility] [address] [place], [place] [id number] Lindon Romp MD [id number]   Troponin I     Status: None   Collection Time: 10/27/16 12:46 PM  Result Value Ref Range   Troponin I <0.03 <0.03 ng/mL  CK     Status: Abnormal   Collection Time: 10/27/16 12:46 PM  Result Value Ref Range   Total CK 28 (L) 38 - 234 U/L  Hemoglobin A1c     Status: Abnormal   Collection Time: 10/27/16 12:46 PM  Result Value Ref Range   Hgb A1c MFr Bld 5.9 (H) 4.8 - 5.6 %    Comment: (NOTE)         Pre-diabetes: 5.7 - 6.4         Diabetes: >6.4  Glycemic control for adults with diabetes: <7.0    Mean Plasma Glucose 123 mg/dL    Comment: (NOTE) Performed At: [facility] [place],  [place] [id number] Lindon Romp MD [id number]   RPR     Status: None   Collection Time: 10/27/16 12:46 PM  Result Value Ref Range   RPR Ser Ql Non Reactive Non Reactive    Comment: (NOTE) Performed At: [facility] [place], [place] [id number] Lindon Romp MD [id number]   Glucose, capillary     Status: Abnormal   Collection Time: 10/27/16  4:25 PM  Result Value Ref Range   Glucose-Capillary 127 (H) 65 - 99 mg/dL   Comment 1 Notify RN   Troponin I     Status: None   Collection Time: 10/27/16  6:22 PM  Result Value Ref Range   Troponin I <0.03 <0.03 ng/mL  Glucose, capillary     Status: None   Collection Time: 10/27/16  7:59 PM  Result Value Ref Range   Glucose-Capillary 93 65 - 99 mg/dL  Basic metabolic panel     Status: Abnormal   Collection Time: 10/28/16  5:04 AM  Result Value Ref Range   Sodium 141 135 - 145 mmol/L   Potassium 4.4 3.5 - 5.1 mmol/L   Chloride 112 (H) 101 - 111 mmol/L   CO2 22 22 - 32 mmol/L   Glucose, Bld 92 65 - 99 mg/dL   BUN 60 (H) 6 - 20 mg/dL   Creatinine, Ser 4.43 (H) 0.44 - 1.00 mg/dL   Calcium 9.0 8.9 - 10.3 mg/dL   GFR calc non Af Amer 11 (L) >60 mL/min   GFR calc Af Amer 13 (L) >60 mL/min    Comment: (NOTE) The eGFR has been calculated using the CKD EPI equation. This calculation has not been validated in all clinical situations. eGFR's persistently <60 mL/min signify possible Chronic Kidney Disease.    Anion gap 7 5 - 15  Lipid panel     Status: Abnormal   Collection Time: 10/28/16  5:04 AM  Result Value Ref Range   Cholesterol 95 0 - 200 mg/dL   Triglycerides 122 <150 mg/dL   HDL 31 (L) >40 mg/dL   Total CHOL/HDL Ratio 3.1 RATIO   VLDL 24 0 - 40 mg/dL   LDL Cholesterol 40 0 - 99 mg/dL    Comment:        Total Cholesterol/HDL:CHD Risk Coronary Heart Disease Risk Table                     Men   Women  1/2 Average Risk   3.4   3.3  Average Risk       5.0   4.4  2 X Average Risk   9.6   7.1  3 X  Average Risk  23.4   11.0        Use the calculated Patient Ratio above and the CHD Risk Table to determine the patient's CHD Risk.        ATP III CLASSIFICATION (LDL):  <100     mg/dL   Optimal  100-129  mg/dL   Near or Above                    Optimal  130-159  mg/dL   Borderline  160-189  mg/dL   High  >190     mg/dL   Very High   Glucose, capillary  Status: None   Collection Time: 10/28/16  8:08 AM  Result Value Ref Range   Glucose-Capillary 96 65 - 99 mg/dL    ABGS No results for input(s): PHART, PO2ART, TCO2, HCO3 in the last 72 hours.  Invalid input(s): PCO2 CULTURES No results found for this or any previous visit (from the past 240 hour(s)). Studies/Results: Ct Head Wo Contrast  Result Date: 10/27/2016 CLINICAL DATA:  Headache, chest pain, bilateral hand numbness EXAM: CT HEAD WITHOUT CONTRAST TECHNIQUE: Contiguous axial images were obtained from the base of the skull through the vertex without intravenous contrast. COMPARISON:  06/21/2015 FINDINGS: Brain: No evidence of acute infarction, hemorrhage, hydrocephalus, extra-axial collection or mass lesion/mass effect. Vascular: No hyperdense vessel or unexpected calcification. Skull: Normal. Negative for fracture or focal lesion. Sinuses/Orbits: No acute finding. Other: None. IMPRESSION: Normal head CT. Electronically Signed   By: Charline Bills M.D.   On: 10/27/2016 09:00    Medications:  Prior to Admission:  Prescriptions Prior to Admission  Medication Sig Dispense Refill Last Dose  . albuterol (PROVENTIL HFA;VENTOLIN HFA) 108 (90 Base) MCG/ACT inhaler Inhale 2 puffs into the lungs every 4 (four) hours as needed for wheezing or shortness of breath. 1 Inhaler 0 10/26/2016 at Unknown time  . albuterol (PROVENTIL) (2.5 MG/3ML) 0.083% nebulizer solution Take 2.5 mg by nebulization every 6 (six) hours as needed for wheezing or shortness of breath.    10/26/2016 at Unknown time  . ALPRAZolam (XANAX) 1 MG tablet Take 1 mg by  mouth 4 (four) times daily.     10/26/2016 at Unknown time  . atorvastatin (LIPITOR) 10 MG tablet Take 10 mg by mouth daily. Take along with 20 mg tablet to equal 30 mg daily.   10/26/2016 at Unknown time  . atorvastatin (LIPITOR) 20 MG tablet Take 20 mg by mouth daily. Take along with 10 mg tablet to equal 30 mg daily.   10/26/2016 at Unknown time  . baclofen (LIORESAL) 20 MG tablet Take 20 mg by mouth 3 (three) times daily as needed for muscle spasms.   10/26/2016 at Unknown time  . carvedilol (COREG) 12.5 MG tablet Take 1 tablet (12.5 mg total) by mouth 2 (two) times daily with a meal. 60 tablet 0 10/26/2016 at 0830pm  . fluticasone (FLONASE) 50 MCG/ACT nasal spray Place 2 sprays into both nostrils at bedtime.   10/26/2016 at Unknown time  . furosemide (LASIX) 20 MG tablet Take 20 mg by mouth daily as needed for edema.   unknown  . hydrALAZINE (APRESOLINE) 10 MG tablet Take 1 tablet (10 mg total) by mouth every 8 (eight) hours. 90 tablet 0 10/26/2016 at Unknown time  . HYDROcodone-acetaminophen (NORCO/VICODIN) 5-325 MG tablet Take 1 tablet by mouth every 4 (four) hours as needed for moderate pain.   10/26/2016 at Unknown time  . lisinopril-hydrochlorothiazide (PRINZIDE,ZESTORETIC) 20-12.5 MG tablet Take 1 tablet by mouth daily.   10/26/2016 at Unknown time  . loperamide (IMODIUM) 2 MG capsule Take 1 capsule (2 mg total) by mouth 4 (four) times daily as needed for diarrhea or loose stools. 12 capsule 0 10/26/2016 at Unknown time  . metoCLOPramide (REGLAN) 10 MG tablet Take 1 tablet (10 mg total) by mouth every 6 (six) hours as needed for nausea (or headache). 30 tablet 0 10/26/2016 at Unknown time  . predniSONE (DELTASONE) 10 MG tablet Take 6 tablets day one, 5 tablets day two, 4 tablets day three, 3 tablets day four, 2 tablets day five, then 1 tablet day six 21 tablet  0 10/26/2016 at Unknown time  . rOPINIRole (REQUIP) 1 MG tablet Take 1 mg by mouth at bedtime.    10/26/2016 at Unknown time   Scheduled: . albuterol  2.5 mg  Inhalation QID  . ALPRAZolam  1 mg Oral QID  . atorvastatin  30 mg Oral q1800  . carvedilol  12.5 mg Oral BID WC  . enoxaparin (LOVENOX) injection  30 mg Subcutaneous Q24H  . feeding supplement (GLUCERNA SHAKE)  237 mL Oral BID BM  . fluticasone  2 spray Each Nare QHS  . hydrALAZINE  10 mg Oral Q8H  . insulin aspart  0-5 Units Subcutaneous QHS  . insulin aspart  0-9 Units Subcutaneous TID WC  . mouth rinse  15 mL Mouth Rinse BID  . rOPINIRole  1 mg Oral QHS   Continuous:  PHX:[id number] **OR** acetaminophen, baclofen, hydrALAZINE, HYDROcodone-acetaminophen, loperamide, metoCLOPramide, ondansetron **OR** ondansetron (ZOFRAN) IV  Assesment: She was admitted with atypical chest pain. She has multiple other medical problems including uncontrolled diabetes now following with a endocrinologist. She has had increasing creatinine over the last several months. Her blood sugars doing a little bit better. She feels like she may be having panic attacks she has a history of anxiety and depression. She has chronic pain on pain medication. Her echocardiogram was essentially normal except for left ventricular hypertrophy. She has hypertension which is pretty well controlled but was quite elevated when she came in Active Problems:   Essential hypertension   Chest pain   CKD (chronic kidney disease) stage 5, GFR less than 15 ml/min (HCC)   Uncontrolled type 2 diabetes mellitus with hyperglycemia, without long-term current use of insulin (HCC)   Sensory disturbance   Left hemiparesis [facility])    Plan: Cardiology consultation. If she does not need inpatient testing I will plan to send her home later today    LOS: 0 days   Remedy Corporan L 10/28/2016, 8:41 AM

## 2016-10-28 NOTE — Consult Note (Signed)
CARDIOLOGY CONSULT NOTE   Patient ID: Martha [name] MRN: [id number] DOB/AGE: Apr 16, 1966 51 y.o.  Admit Date: 10/27/2016 Referring Physician: Sinda Du, MD Primary Physician: Alonza Bogus, MD Consulting Cardiologist: Jenkins Rouge MD Primary Cardiologist New Reason for Consultation: Chest Pain  Clinical Summary  Martha [name] is a 51 y.o. female who is being seen today for the evaluation of chest pain and hypertension at the request of Dr. Luan Pulling.She has a history of CKD stage V, COPD, hypertension, Diabetes, OSA, Bipolar, and restless leg syndrome.   She states that she was having back pain, pain in her arms bilaterally and left sided chest discomfort described as "stinging heart burn." She states that the arm discomfort and back pain are not new symptoms, as she has been having these symptoms for years. However, when she got ready for bed night before admission, placed her CPAP on and began to have worsening chest burning and stinging. She has to sit up. No associated dyspnea, diaphoresis or dizziness. Pain lasted for 3-4 hours before presenting to ER.  She presented to the ER with complaints of weakness in her extremities, chest burning and hypertension. Highest recorded BP 250/150. Pertinent labs include Creatinine of 4.25, BUN 54, No evidence of anemia, EKG revealed NSR with repolarization abnormality, Troponin 0.03. She was treated with IV nicardipine, oral clonidine 0.2 mg, xanax 1 mg, po and GI cocktail. She states that the pain subsided in the ER, but has had some intermittent discomfort under the left breast. She thinks she is having panic attacks, as she is under a lot of stress.   Of note, she is being followed by nephrology, Dr. Iona Hansen, and is scheduled for AV fistula placement the first week of May, 2018.  She is currently comfortable. BP remains elevated. She denies medical non-compliance although there is a history of this.    Allergies  Allergen Reactions  .  Codeine Nausea And Vomiting    Medications Scheduled Medications: . albuterol  2.5 mg Inhalation QID  . ALPRAZolam  1 mg Oral QID  . atorvastatin  30 mg Oral q1800  . carvedilol  12.5 mg Oral BID WC  . enoxaparin (LOVENOX) injection  30 mg Subcutaneous Q24H  . feeding supplement (GLUCERNA SHAKE)  237 mL Oral BID BM  . fluticasone  2 spray Each Nare QHS  . hydrALAZINE  10 mg Oral Q8H  . insulin aspart  0-5 Units Subcutaneous QHS  . insulin aspart  0-9 Units Subcutaneous TID WC  . mouth rinse  15 mL Mouth Rinse BID  . rOPINIRole  1 mg Oral QHS     Infusions:   PRN Medications:  acetaminophen **OR** acetaminophen, baclofen, hydrALAZINE, HYDROcodone-acetaminophen, loperamide, metoCLOPramide, ondansetron **OR** ondansetron (ZOFRAN) IV   Past Medical History:  Diagnosis Date  . Acid reflux   . Anemia   . Arthritis   . Bipolar 1 disorder ([place])   . Cervical radiculopathy   . COPD (chronic obstructive pulmonary disease) ([place]) 2014   bronchitis  . Degenerative disc disease, thoracic   . Depression   . Diabetes mellitus   . Hypertension   . Noncompliance with medication regimen   . Obesity (BMI 30-39.9)   . OSA (obstructive sleep apnea)    cpap  . RLS (restless legs syndrome)     Past Surgical History:  Procedure Laterality Date  . BIOPSY N/A 05/17/2013   Procedure: BIOPSY;  Surgeon: [name], MD;  Location: [facility];  Service: Endoscopy;  Laterality: N/A;  .  CHOLECYSTECTOMY N/A 04/27/2014   Procedure: LAPAROSCOPIC CHOLECYSTECTOMY;  Surgeon: [name], MD;  Location: [facility];  Service: General;  Laterality: N/A;  . COLONOSCOPY  06/06/2010   PIR:JJOACZ bleeding secondary to internal hemorrhoids but incomplete evaluation secondary to poor right colon prep/small rectal and sigmoid colon polyps (hyperplastic). PROPOFOL  . COLONOSCOPY  May 2013   Dr. Gilliam/[facility]: 5 mm a descending colon polyp, hyperplastic. Adequate bowel prep.  . ENDOMETRIAL ABLATION    .  ESOPHAGOGASTRODUODENOSCOPY  06/06/2010   YSA:YTKZSWF erythema and edema of body of stomach, with sessile polypoid lesions. bx benign. no h.pylori  . ESOPHAGOGASTRODUODENOSCOPY (EGD) WITH PROPOFOL N/A 05/17/2013   Dr. Oneida Alar: normal esophagus, moderate nodular gastritis, negative path, empiric Savary dilation  . FLEXIBLE SIGMOIDOSCOPY N/A 05/17/2013   Dr. Oneida Alar: moderate sized internal hemorrhoids  . HEMORRHOID BANDING N/A 05/17/2013   Procedure: HEMORRHOID BANDING;  Surgeon: [name], MD;  Location: [facility];  Service: Endoscopy;  Laterality: N/A;  2 bands placed  . INCISION AND DRAINAGE ABSCESS Left 10/11/2013   Procedure: INCISION AND DRAINAGE AND DEBRIDEMENT LEFT BREAST  ABSCESS;  Surgeon: [name], MD;  Location: [facility];  Service: General;  Laterality: Left;  . IRRIGATION AND DEBRIDEMENT ABSCESS Right 06/01/2013   Procedure: INCISION AND DRAINAGE AND DEBRIDEMENT ABSCESS RIGHT BREAST;  Surgeon: [name], MD;  Location: [facility];  Service: General;  Laterality: Right;  . SAVORY DILATION N/A 05/17/2013   Procedure: SAVORY DILATION;  Surgeon: [name], MD;  Location: [facility];  Service: Endoscopy;  Laterality: N/A;  14/15/16  . TUBAL LIGATION      Family History  Problem Relation Age of Onset  . Adopted: Yes  . Family history unknown: Yes     Social History [name] reports that she quit smoking about 9 years ago. Her smoking use included Cigarettes. She has a 37.50 pack-year smoking history. She has quit using smokeless tobacco. [name] reports that she does not drink alcohol.  Review of Systems Complete review of systems are found to be negative unless outlined in H&P above.  Physical Examination Blood pressure (!) 166/101, pulse 79, temperature 98.3 F (36.8 C), temperature source Oral, resp. rate 18, height 5\' 8"  (1.727 m), weight 280 lb (127 kg), SpO2 95 %.  Intake/Output Summary (Last 24 hours) at 10/28/16 0827 Last data filed at 10/28/16 0932   Gross per 24 hour  Intake            69.17 ml  Output              600 ml  Net          -530.83 ml    Telemetry: NSR with rates in the 70-80 without arrhythmias   GEN: No acute distress  HEENT: Conjunctiva and lids normal, oropharynx clear with moist mucosa. Neck: Supple, no elevated JVP or carotid bruits, no thyromegaly. Lungs: Clear to auscultation, nonlabored breathing at rest. Cardiac: Regular rate and rhythm, no S3 or significant systolic murmur, no pericardial rub. Abdomen: Soft, nontender, no hepatomegaly, bowel sounds present, no guarding or rebound.Obese Extremities: No pitting edema, distal pulses 2+. Skin: Warm and dry. Musculoskeletal: No kyphosis. Neuropsychiatric: Alert and oriented x3, affect grossly appropriate.  Prior Cardiac Testing/Procedures Echocardiogram 10/27/2016 Left ventricle: The cavity size was normal. Wall thickness was   increased in a pattern of mild LVH. Systolic function was normal.   The estimated ejection fraction was in the range of 60% to 65%.   Wall motion  was normal; there were no regional wall motion   abnormalities. Left ventricular diastolic function parameters   were normal. - Left atrium: The atrium was mildly dilated. - Atrial septum: No defect or patent foramen ovale was identified. - Pericardium, extracardiac: A trivial pericardial effusion was   identified posterior to the heart.  Lab Results  Basic Metabolic Panel:  Recent Labs Lab 10/23/16 0909 10/27/16 0621 10/28/16 0504  NA 140 141 141  K 4.4 4.7 4.4  CL 108 107 112*  CO2 22 22 22   GLUCOSE 121* 146* 92  BUN 64* 54* 60*  CREATININE 5.21* 4.25* 4.43*  CALCIUM 9.5 9.7 9.0    Liver Function Tests:  Recent Labs Lab 10/27/16 0621  AST 14*  ALT 24  ALKPHOS 83  BILITOT 0.5  PROT 7.6  ALBUMIN 3.8    CBC:  Recent Labs Lab 10/23/16 0909 10/27/16 0621  WBC 9.3 7.7  NEUTROABS 3.5 5.9  HGB 12.2 12.3  HCT 37.0 37.6  MCV 85.5 84.9  PLT 158 194    Cardiac  Enzymes:  Recent Labs Lab 10/23/16 0909 10/27/16 0621 10/27/16 1246 10/27/16 1822  CKTOTAL  --   --  28*  --   TROPONINI <0.03 <0.03 <0.03 <0.03    Radiology: Ct Head Wo Contrast  Result Date: 10/27/2016 CLINICAL DATA:  Headache, chest pain, bilateral hand numbness EXAM: CT HEAD WITHOUT CONTRAST TECHNIQUE: Contiguous axial images were obtained from the base of the skull through the vertex without intravenous contrast. COMPARISON:  06/21/2015 FINDINGS: Brain: No evidence of acute infarction, hemorrhage, hydrocephalus, extra-axial collection or mass lesion/mass effect. Vascular: No hyperdense vessel or unexpected calcification. Skull: Normal. Negative for fracture or focal lesion. Sinuses/Orbits: No acute finding. Other: None. IMPRESSION: Normal head CT. Electronically Signed   By: Julian Hy M.D.   On: 10/27/2016 09:00     ECG: NSR with repolarization abnormalities in all leads.    Impression and Recommendations  1. Chest Pain: Typical and atypical features associated with hypertension. CVRF include, former tobacco abuse, DM, hypertension, hypercholesterolemia, obesity. FH unknown as she is adopted. Will cycle cardiac enzymes to be thorough, repeat EKG this am. Talked to her about stress test for diagnostic prognostic purposes. She refuses, stating she does not want to undergo testing that will increase her HR.    2. Uncontrolled Hypertension: She has long history of hypertension and is on lisinopril, hydralazine, carvedilol, at home. Now on same but taken off of ACE in the setting of renal disease. Will increase hydralazine to 25 mg TID from 10 mg TID.   3. CKD Stage V: Followed by nephrology. Planned AV fistula placement next month per patient. If she agrees to stress test, and it is found to be abnormal, will need discuss timing of invasive testing to coincide with possible dialysis. Will not make this decision unless necessary. She is reluctant to have stress test at this time.     4. Neuropathy: Has chronic pain, numbness and tingling in her arms bilaterally.   5. Hypercholesterolemia; On Lipitor at home. Review of lipid panel reflects excellent control. Continue statin.   6. Hx of Bipolar: States she sometimes has panic attacks. Describes similar symptoms as on admission but is not clear on how she experiences this.   Signed: Phill Myron. Lawrence NP [place]  10/28/2016, 8:27 AM  Co-Sign MD  Patient examined chart reviewed. Atypical pain with CRF's HTN and elevated lipids R/O in need of vascular surgery latter this month For dialysis access. Exam with  multiple tattoos SEM with 2 component friction rub. Would Rx for pericarditis ? Uremic She was hesitantto Have stress test but I told her vascular surgeons would not likely do fistula without heart clearance and she has consented. Will schedule lexiscan myovue for am   Jenkins Rouge, MD

## 2016-10-28 NOTE — Progress Notes (Signed)
Received call from Radiology that patient will be having a stress test 10/29/2016 at approximately 0715.  Patient to be NPO at midnight tonight.

## 2016-10-28 NOTE — ED Notes (Signed)
Pt continues to take off pulse ox cord and bp cuff.

## 2016-10-29 ENCOUNTER — Encounter (HOSPITAL_COMMUNITY): Payer: Self-pay

## 2016-10-29 ENCOUNTER — Observation Stay (HOSPITAL_BASED_OUTPATIENT_CLINIC_OR_DEPARTMENT_OTHER): Payer: Medicaid Other

## 2016-10-29 DIAGNOSIS — G8194 Hemiplegia, unspecified affecting left nondominant side: Secondary | ICD-10-CM | POA: Diagnosis not present

## 2016-10-29 DIAGNOSIS — E782 Mixed hyperlipidemia: Secondary | ICD-10-CM

## 2016-10-29 DIAGNOSIS — R079 Chest pain, unspecified: Secondary | ICD-10-CM

## 2016-10-29 DIAGNOSIS — I10 Essential (primary) hypertension: Secondary | ICD-10-CM

## 2016-10-29 DIAGNOSIS — N185 Chronic kidney disease, stage 5: Secondary | ICD-10-CM | POA: Diagnosis not present

## 2016-10-29 DIAGNOSIS — E1165 Type 2 diabetes mellitus with hyperglycemia: Secondary | ICD-10-CM | POA: Diagnosis not present

## 2016-10-29 LAB — GLUCOSE, CAPILLARY
GLUCOSE-CAPILLARY: 96 mg/dL (ref 65–99)
Glucose-Capillary: 125 mg/dL — ABNORMAL HIGH (ref 65–99)

## 2016-10-29 LAB — NM MYOCAR MULTI W/SPECT W/WALL MOTION / EF
CHL CUP NUCLEAR SDS: 0
CHL CUP NUCLEAR SRS: 9
CHL CUP RESTING HR STRESS: 77 {beats}/min
LHR: 0.23
LV dias vol: 73 mL (ref 46–106)
LVSYSVOL: 32 mL
NUC STRESS TID: 0.82
Peak HR: 104 {beats}/min
SSS: 9

## 2016-10-29 MED ORDER — HYDRALAZINE HCL 25 MG PO TABS: 25.0000 mg | ORAL_TABLET | Freq: Three times a day (TID) | ORAL | 12 refills | Status: DC

## 2016-10-29 MED ORDER — TECHNETIUM TC 99M TETROFOSMIN IV KIT
30.0000 | PACK | Freq: Once | INTRAVENOUS | Status: AC | PRN
  Administered 2016-10-29: 33 via INTRAVENOUS

## 2016-10-29 MED ORDER — REGADENOSON 0.4 MG/5ML IV SOLN
INTRAVENOUS | Status: AC
  Administered 2016-10-29: 0.4 mg via INTRAVENOUS
  Filled 2016-10-29: qty 5

## 2016-10-29 MED ORDER — TECHNETIUM TC 99M TETROFOSMIN IV KIT
10.0000 | PACK | Freq: Once | INTRAVENOUS | Status: AC | PRN
  Administered 2016-10-29: 11 via INTRAVENOUS

## 2016-10-29 MED ORDER — COLCHICINE 0.6 MG PO TABS: 0.6000 mg | ORAL_TABLET | Freq: Every day | ORAL | 1 refills | Status: DC

## 2016-10-29 MED ORDER — ALBUTEROL SULFATE (2.5 MG/3ML) 0.083% IN NEBU: 2.5000 mg | INHALATION_SOLUTION | Freq: Three times a day (TID) | RESPIRATORY_TRACT | 12 refills | Status: DC

## 2016-10-29 MED ORDER — SODIUM CHLORIDE 0.9% FLUSH
INTRAVENOUS | Status: AC
  Administered 2016-10-29: 10 mL via INTRAVENOUS
  Filled 2016-10-29: qty 10

## 2016-10-29 MED ORDER — POLYETHYLENE GLYCOL 3350 17 G PO PACK: 17.0000 g | PACK | Freq: Every day | ORAL | 0 refills | Status: DC

## 2016-10-29 MED ORDER — POLYETHYLENE GLYCOL 3350 17 G PO PACK
17.0000 g | PACK | Freq: Every day | ORAL | Status: DC
  Filled 2016-10-29: qty 1

## 2016-10-29 NOTE — Progress Notes (Signed)
Progress Note  Patient Name: Martha [name] Date of Encounter: 10/29/2016  Primary Cardiologist: new - Nishan yesterday  Subjective   Feeling much better. No chest pain. Tolerated Lexiscan well. BP 130/88 down in nuc med.  Inpatient Medications    Scheduled Meds: . albuterol  2.5 mg Inhalation TID  . ALPRAZolam  1 mg Oral QID  . atorvastatin  30 mg Oral q1800  . carvedilol  12.5 mg Oral BID WC  . colchicine  0.6 mg Oral BID  . enoxaparin (LOVENOX) injection  30 mg Subcutaneous Q24H  . feeding supplement (GLUCERNA SHAKE)  237 mL Oral BID BM  . fluticasone  2 spray Each Nare QHS  . hydrALAZINE  25 mg Oral Q8H  . insulin aspart  0-5 Units Subcutaneous QHS  . insulin aspart  0-9 Units Subcutaneous TID WC  . mouth rinse  15 mL Mouth Rinse BID  . regadenoson      . regadenoson  0.4 mg Intravenous Once  . rOPINIRole  1 mg Oral QHS  . sodium chloride flush       Continuous Infusions:  PRN Meds: acetaminophen **OR** acetaminophen, baclofen, hydrALAZINE, HYDROcodone-acetaminophen, loperamide, metoCLOPramide, ondansetron **OR** ondansetron (ZOFRAN) IV, technetium tetrofosmin   Vital Signs    Vitals:   10/28/16 1422 10/28/16 1931 10/28/16 2133 10/29/16 0658  BP:   (!) 148/82 (!) 150/93  Pulse:   81 87  Resp:   19 20  Temp:   97.5 F (36.4 C) 97.9 F (36.6 C)  TempSrc:   Oral Oral  SpO2: 98% 97% 100% 100%  Weight:      Height:        Intake/Output Summary (Last 24 hours) at 10/29/16 0827 Last data filed at 10/29/16 0659  Gross per 24 hour  Intake              960 ml  Output             1700 ml  Net             -740 ml   Filed Weights   10/27/16 0542  Weight: 280 lb (127 kg)    Telemetry    NSR - Personally Reviewed  Physical Exam   GEN: No acute distress.  HEENT: Normocephalic, atraumatic, sclera non-icteric. Neck: No JVD or bruits. Cardiac: RRR no murmurs, rubs, or gallops.  Radials/DP/PT 1+ and equal bilaterally.  Respiratory: Clear to auscultation  bilaterally. Breathing is unlabored. GI: Soft, nontender, non-distended, BS +x 4. MS: no deformity. Extremities: No clubbing or cyanosis. No edema. Distal pedal pulses are 2+ and equal bilaterally. Neuro:  AAOx3. Follows commands. Psych:  Responds to questions appropriately with a normal affect.  Labs    Chemistry Recent Labs Lab 10/23/16 [phone] 10/27/16 0621 10/28/16 0504  NA 140 141 141  K 4.4 4.7 4.4  CL 108 107 112*  CO2 22 22 22   GLUCOSE 121* 146* 92  BUN 64* 54* 60*  CREATININE 5.21* 4.25* 4.43*  CALCIUM 9.5 9.7 9.0  PROT  --  7.6  --   ALBUMIN  --  3.8  --   AST  --  14*  --   ALT  --  24  --   ALKPHOS  --  83  --   BILITOT  --  0.5  --   GFRNONAA 9* 11* 11*  GFRAA 10* 13* 13*  ANIONGAP 10 12 7      Hematology Recent Labs Lab 10/23/16 0909 10/27/16 0621  WBC  9.3 7.7  RBC 4.33 4.43  HGB 12.2 12.3  HCT 37.0 37.6  MCV 85.5 84.9  MCH 28.2 27.8  MCHC 33.0 32.7  RDW 15.3 14.9  PLT 158 194    Cardiac Enzymes Recent Labs Lab 10/27/16 1822 10/28/16 0922 10/28/16 1454 10/28/16 2143  TROPONINI <0.03 <0.03 <0.03 <0.03   No results for input(s): TROPIPOC in the last 168 hours.   BNPNo results for input(s): BNP, PROBNP in the last 168 hours.   DDimer No results for input(s): DDIMER in the last 168 hours.   Radiology    Ct Head Wo Contrast  Result Date: 10/27/2016 CLINICAL DATA:  Headache, chest pain, bilateral hand numbness EXAM: CT HEAD WITHOUT CONTRAST TECHNIQUE: Contiguous axial images were obtained from the base of the skull through the vertex without intravenous contrast. COMPARISON:  06/21/2015 FINDINGS: Brain: No evidence of acute infarction, hemorrhage, hydrocephalus, extra-axial collection or mass lesion/mass effect. Vascular: No hyperdense vessel or unexpected calcification. Skull: Normal. Negative for fracture or focal lesion. Sinuses/Orbits: No acute finding. Other: None. IMPRESSION: Normal head CT. Electronically Signed   By: Julian Hy  M.D.   On: 10/27/2016 09:00    Cardiac Studies   2D Echo 10/27/16 - Left ventricle: The cavity size was normal. Wall thickness was   increased in a pattern of mild LVH. Systolic function was normal.   The estimated ejection fraction was in the range of 60% to 65%.   Wall motion was normal; there were no regional wall motion   abnormalities. Left ventricular diastolic function parameters   were normal. - Left atrium: The atrium was mildly dilated. - Atrial septum: No defect or patent foramen ovale was identified. - Pericardium, extracardiac: A trivial pericardial effusion was   identified posterior to the heart.  Patient Profile     51 y.o. female with CKD stage V (pending AVF 11/2016), COPD, hypertension, Diabetes, OSA, Bipolar, restless leg syndrome, prior noncompliance admitted with back pain, arm pain, stinging heartburn-like left sided chest discomfort and panic attacks as well as elevated BP up to 250/150.  Assessment & Plan    1. Chest pain - r/o for MI, somewhat mixed atypical/typical features, pending nuc result. EF normal by nuc.  2. Uncontrolled HTN - lisinopril/HCTZ on hold given CKD. Hydralazine increased to 25mg  TID yesterday. BP was well controlled down in nuc med this AM.  3. CKD stage V - followed by nephrology.  4. Neuropathy- Has chronic pain, numbness and tingling in her arms bilaterally.   5. Hyperlipidemia - LDL 40. Continue statin.  Signed, Charlie Pitter, PA-C  10/29/2016, 8:27 AM    The patient was seen and examined, and I agree with the history, physical exam, assessment and plan as documented above, with modifications as noted below. No further chest pain. I personally reviewed and interpreted nuclear stress test this morning, which showed inferior and inferolateral soft tissue attenuation artifact, although a mild degree of myocardial scar could not entirely be ruled out. However, it was a low risk study overall and LVEF was normal. Continue optimal BP  control. Can be discharged from my standpoint.  Kate Sable, MD, [facility]  10/29/2016 11:10 AM

## 2016-10-29 NOTE — Progress Notes (Signed)
She had her stress test this morning. No new complaints. She seems calmer. She may be able to be discharged later today depending on how the stress test looks.  Exam shows that she is awake and alert and in no acute distress. She is obese. Her chest is clear. Heart is regular with normal heart sounds.  She was admitted with typical and atypical chest pain and that's being worked up. She has multiple cardiac risk factors. She is approaching dialysis. I think some of her problem clearly is related to anxiety and depression and her medications have been adjusted. Await results of stress test

## 2016-10-29 NOTE — Care Management Note (Signed)
Case Management Note  Patient Details  Name: Martha [name] MRN: [id number] Date of Birth: February 25, 1966  Subjective/Objective:                  Pt admitted with r/o CP. Chart reviewed for CM needs. Pt is from home, lives with family. She has PCP, transportation to appointments and insurance with drug coverage. She has no HH or DME needs.   Action/Plan: Pt discharging home today with self care.   Expected Discharge Date:  10/29/16               Expected Discharge Plan:  Home/Self Care  In-House Referral:  NA  Discharge planning Services  CM Consult  Post Acute Care Choice:  NA Choice offered to:  NA  Status of Service:  Completed, signed off  Sherald Barge, RN 10/29/2016, 12:56 PM

## 2016-10-29 NOTE — Progress Notes (Signed)
Pt IV and telemetry removed, tolerated well.  Reviewed discharge instructions with pt and family at bedside and answered questions at this time.

## 2016-10-31 NOTE — Discharge Summary (Signed)
Physician Discharge Summary  Patient ID: Martha [name] MRN: [id number] DOB/AGE: 49-Dec-1967 49 y.o. Primary Care Physician:Camera Krienke L, MD Admit date: 10/27/2016 Discharge date: 10/31/2016    Discharge Diagnoses:   Active Problems:   Hyperlipidemia   Essential hypertension   Diabetes mellitus, type 2 (HCC)   Chest pain   CKD (chronic kidney disease) stage 5, GFR less than 15 ml/min (HCC)   Uncontrolled type 2 diabetes mellitus with hyperglycemia, without long-term current use of insulin (HCC)   Sensory disturbance   Left hemiparesis (HCC) Panic attacks  Allergies as of 10/29/2016      Reactions   Codeine Nausea And Vomiting      Medication List    TAKE these medications   albuterol 108 (90 Base) MCG/ACT inhaler Commonly known as:  PROVENTIL HFA;VENTOLIN HFA Inhale 2 puffs into the lungs every 4 (four) hours as needed for wheezing or shortness of breath. What changed:  Another medication with the same name was added. Make sure you understand how and when to take each.   albuterol (2.5 MG/3ML) 0.083% nebulizer solution Commonly known as:  PROVENTIL Inhale 3 mLs (2.5 mg total) into the lungs 3 (three) times daily. What changed:  You were already taking a medication with the same name, and this prescription was added. Make sure you understand how and when to take each.   albuterol (2.5 MG/3ML) 0.083% nebulizer solution Commonly known as:  PROVENTIL Take 2.5 mg by nebulization every 6 (six) hours as needed for wheezing or shortness of breath. What changed:  Another medication with the same name was added. Make sure you understand how and when to take each.   ALPRAZolam 1 MG tablet Commonly known as:  XANAX Take 1 mg by mouth 4 (four) times daily.   atorvastatin 20 MG tablet Commonly known as:  LIPITOR Take 20 mg by mouth daily. Take along with 10 mg tablet to equal 30 mg daily.   atorvastatin 10 MG tablet Commonly known as:  LIPITOR Take 10 mg by mouth daily. Take  along with 20 mg tablet to equal 30 mg daily.   baclofen 20 MG tablet Commonly known as:  LIORESAL Take 20 mg by mouth 3 (three) times daily as needed for muscle spasms.   carvedilol 12.5 MG tablet Commonly known as:  COREG Take 1 tablet (12.5 mg total) by mouth 2 (two) times daily with a meal.   colchicine 0.6 MG tablet Take 1 tablet (0.6 mg total) by mouth daily.   fluticasone 50 MCG/ACT nasal spray Commonly known as:  FLONASE Place 2 sprays into both nostrils at bedtime.   furosemide 20 MG tablet Commonly known as:  LASIX Take 20 mg by mouth daily as needed for edema.   hydrALAZINE 25 MG tablet Commonly known as:  APRESOLINE Take 1 tablet (25 mg total) by mouth every 8 (eight) hours. What changed:  medication strength  how much to take   HYDROcodone-acetaminophen 5-325 MG tablet Commonly known as:  NORCO/VICODIN Take 1 tablet by mouth every 4 (four) hours as needed for moderate pain.   lisinopril-hydrochlorothiazide 20-12.5 MG tablet Commonly known as:  PRINZIDE,ZESTORETIC Take 1 tablet by mouth daily.   loperamide 2 MG capsule Commonly known as:  IMODIUM Take 1 capsule (2 mg total) by mouth 4 (four) times daily as needed for diarrhea or loose stools.   metoCLOPramide 10 MG tablet Commonly known as:  REGLAN Take 1 tablet (10 mg total) by mouth every 6 (six) hours as needed for nausea (or  headache).   PARoxetine 20 MG tablet Commonly known as:  PAXIL Take 1 daily. This is to prevent panic attacks   polyethylene glycol packet Commonly known as:  MIRALAX / GLYCOLAX Take 17 g by mouth daily.   predniSONE 10 MG tablet Commonly known as:  DELTASONE Take 6 tablets day one, 5 tablets day two, 4 tablets day three, 3 tablets day four, 2 tablets day five, then 1 tablet day six   rOPINIRole 1 MG tablet Commonly known as:  REQUIP Take 1 mg by mouth at bedtime.       Discharged Condition:Improved    Consults: Cardiology  Significant Diagnostic Studies: Dg  Chest 2 View  Result Date: 10/23/2016 CLINICAL DATA:  Onset of shortness of breath with exertion last night associated with coughing and diarrhea. History of COPD. Former smoker. EXAM: CHEST  2 VIEW COMPARISON:  PA and lateral chest x-ray of July 26, 2016 FINDINGS: The lungs are well-expanded and clear. The heart and pulmonary vascularity are normal. The mediastinum is normal in width. There is no pleural effusion. The bony thorax exhibits no acute abnormality. IMPRESSION: There is no pneumonia nor other acute cardiopulmonary abnormality. Electronically Signed   By: David  Martinique M.D.   On: 10/23/2016 08:40   Ct Head Wo Contrast  Result Date: 10/27/2016 CLINICAL DATA:  Headache, chest pain, bilateral hand numbness EXAM: CT HEAD WITHOUT CONTRAST TECHNIQUE: Contiguous axial images were obtained from the base of the skull through the vertex without intravenous contrast. COMPARISON:  06/21/2015 FINDINGS: Brain: No evidence of acute infarction, hemorrhage, hydrocephalus, extra-axial collection or mass lesion/mass effect. Vascular: No hyperdense vessel or unexpected calcification. Skull: Normal. Negative for fracture or focal lesion. Sinuses/Orbits: No acute finding. Other: None. IMPRESSION: Normal head CT. Electronically Signed   By: Julian Hy M.D.   On: 10/27/2016 09:00   Nm Myocar Multi W/spect W/wall Motion / Ef  Result Date: 10/29/2016  Defect 1: There is a small defect of mild severity present in the basal inferior, basal inferolateral, mid inferior, mid inferolateral and apical lateral location. While this is likely due to soft tissue attenuation artifact, a mild degree of scar cannot entirely be ruled out. No ischemic zones.  This is a low risk study.  Nuclear stress EF: 56%.     Lab Results: Basic Metabolic Panel: No results for input(s): NA, K, CL, CO2, GLUCOSE, BUN, CREATININE, CALCIUM, MG, PHOS in the last 72 hours. Liver Function Tests: No results for input(s): AST, ALT, ALKPHOS,  BILITOT, PROT, ALBUMIN in the last 72 hours.   CBC: No results for input(s): WBC, NEUTROABS, HGB, HCT, MCV, PLT in the last 72 hours.  No results found for this or any previous visit (from the past 240 hour(s)).   Hospital Course: This is a 51 year old who came to the emergency department because of chest discomfort. This had been going on for several hours. She thinks it is related to anxiety. She had some diaphoresis associated with it and some aching quality so there was some atypical and some typical findings. She ruled out for MI. She had cardiology consultation and it was felt that she needed a Myoview stress test and that was done. This was felt to be low risk and she was discharged home. She was started on Paxil for panic attacks she had already been on alprazolam. The Paxil may allow her to prevent panic attacks.  Discharge Exam: Blood pressure (!) 150/93, pulse 87, temperature 97.9 F (36.6 C), temperature source Oral, resp. rate 20,  height 5\' 8"  (1.727 m), weight 127 kg (280 lb), SpO2 100 %. She is awake and alert. She is obese. Her chest is clear. Her heart is regular. She does appear anxious  Disposition: Home  Discharge Instructions    Diet - low sodium heart healthy    Complete by:  As directed    Increase activity slowly    Complete by:  As directed         Signed: Earon Rivest L   10/31/2016, 8:45 AM

## 2016-11-09 ENCOUNTER — Emergency Department (HOSPITAL_COMMUNITY)
Admission: EM | Admit: 2016-11-09 | Discharge: 2016-11-09 | Disposition: A | Discharge status: Home / Self Care | Payer: Medicaid Other | Attending: Emergency Medicine | Admitting: Emergency Medicine

## 2016-11-09 DIAGNOSIS — Z87891 Personal history of nicotine dependence: Secondary | ICD-10-CM | POA: Insufficient documentation

## 2016-11-09 DIAGNOSIS — Z79899 Other long term (current) drug therapy: Secondary | ICD-10-CM | POA: Insufficient documentation

## 2016-11-09 DIAGNOSIS — N185 Chronic kidney disease, stage 5: Secondary | ICD-10-CM | POA: Insufficient documentation

## 2016-11-09 DIAGNOSIS — Z7189 Other specified counseling: Secondary | ICD-10-CM | POA: Diagnosis not present

## 2016-11-09 DIAGNOSIS — J449 Chronic obstructive pulmonary disease, unspecified: Secondary | ICD-10-CM | POA: Diagnosis not present

## 2016-11-09 DIAGNOSIS — I120 Hypertensive chronic kidney disease with stage 5 chronic kidney disease or end stage renal disease: Secondary | ICD-10-CM | POA: Insufficient documentation

## 2016-11-09 DIAGNOSIS — E1122 Type 2 diabetes mellitus with diabetic chronic kidney disease: Secondary | ICD-10-CM | POA: Diagnosis not present

## 2016-11-09 DIAGNOSIS — J95850 Mechanical complication of respirator: Secondary | ICD-10-CM | POA: Diagnosis present

## 2016-11-09 NOTE — Discharge Instructions (Signed)
Call [facility] tomorrow [phone] at 9 am.   They will help you with your equipment.

## 2016-11-09 NOTE — ED Triage Notes (Addendum)
Patient c/o c-pap machine not working at home. Per patient machine "stopped working" last night. Unable to keep mask on. Patient gets equipment from [employer]. Patient states no one at [facility] on Sunday. Phone number found for equipment line for [employer], called, on-call Respiratory Therapist to call back. Patient denies any other complaints.

## 2016-11-09 NOTE — ED Triage Notes (Signed)
Call to respiratory- They do  Not assess C pap and recommend that pt call the agency that provides the C pap for her for assistance.

## 2016-11-09 NOTE — ED Provider Notes (Signed)
[facility] DEPT Provider Note   CSN: [id number] Arrival date & time: 11/09/16  1531  By signing my name below, I, [name], attest that this documentation has been prepared under the direction and in the presence of Helen Hashimoto, PA-C. Electronically Signed: [name], ED Scribe. 11/09/16. 4:38 PM.   History   Chief Complaint Chief Complaint  Patient presents with  . C-pap machine not working    HPI Martha [name] is a 51 y.o. female with a PMHx of COPD, HTN, and DM, who presents to the Emergency Department complaining of that her C-PAP mask is leaking air. Pt reports it is not filling with air and it sounds like the air is escaping. She has been on C-PAP for the last ten years. Pt notes she just got a new mask in January. Pt denies cough, fever, and chills.  The history is provided by the patient. No language interpreter was used.    Past Medical History:  Diagnosis Date  . Acid reflux   . Anemia   . Arthritis   . Bipolar 1 disorder ([place])   . Cervical radiculopathy   . COPD (chronic obstructive pulmonary disease) ([place]) 2014   bronchitis  . Degenerative disc disease, thoracic   . Depression   . Diabetes mellitus   . Hypertension   . Noncompliance with medication regimen   . Obesity (BMI 30-39.9)   . OSA (obstructive sleep apnea)    cpap  . RLS (restless legs syndrome)     Patient Active Problem List   Diagnosis Date Noted  . Chest pain 10/27/2016  . CKD (chronic kidney disease) stage 5, GFR less than 15 ml/min (HCC) 10/27/2016  . Uncontrolled type 2 diabetes mellitus with hyperglycemia, without long-term current use of insulin ([place]) 10/27/2016  . Sensory disturbance 10/27/2016  . Left hemiparesis ([place]) 10/27/2016  . Acute on chronic renal failure ([place]) 05/25/2016  . Abdominal pain 04/26/2014  . Cholelithiasis 04/26/2014  . Acute renal failure ([place]) 04/26/2014  . Dehydration 04/26/2014  . Hyponatremia 04/26/2014  . Gallstones 04/06/2014  .  Uncontrolled diabetes mellitus ([place]) 10/09/2013  . Breast abscess 05/30/2013  . Diabetes mellitus, type 2 ([place]) 05/30/2013  . Hyperglycemia 05/30/2013  . Internal hemorrhoids with other complication 54/62/7035  . Umbilical hernia without mention of obstruction or gangrene 04/15/2013  . Anemia 04/15/2013  . DM 05/13/2010  . Hyperlipidemia 05/13/2010  . OBESITY 05/13/2010  . Depression 05/13/2010  . RESTLESS LEG SYNDROME 05/13/2010  . Essential hypertension 05/13/2010  . GERD 05/13/2010  . RECTAL BLEEDING 05/13/2010  . Sleep apnea 05/13/2010  . Dysphagia, oropharyngeal phase 05/13/2010    Past Surgical History:  Procedure Laterality Date  . BIOPSY N/A 05/17/2013   Procedure: BIOPSY;  Surgeon: [name], MD;  Location: [facility];  Service: Endoscopy;  Laterality: N/A;  . CHOLECYSTECTOMY N/A 04/27/2014   Procedure: LAPAROSCOPIC CHOLECYSTECTOMY;  Surgeon: [name], MD;  Location: [facility];  Service: General;  Laterality: N/A;  . COLONOSCOPY  06/06/2010   KKX:FGHWEX bleeding secondary to internal hemorrhoids but incomplete evaluation secondary to poor right colon prep/small rectal and sigmoid colon polyps (hyperplastic). PROPOFOL  . COLONOSCOPY  May 2013   Dr. Gilliam/[facility]: 5 mm a descending colon polyp, hyperplastic. Adequate bowel prep.  . ENDOMETRIAL ABLATION    . ESOPHAGOGASTRODUODENOSCOPY  06/06/2010   HBZ:JIRCVEL erythema and edema of body of stomach, with sessile polypoid lesions. bx benign. no h.pylori  . ESOPHAGOGASTRODUODENOSCOPY (EGD) WITH PROPOFOL N/A 05/17/2013   Dr. Oneida Alar:  normal esophagus, moderate nodular gastritis, negative path, empiric Savary dilation  . FLEXIBLE SIGMOIDOSCOPY N/A 05/17/2013   Dr. Oneida Alar: moderate sized internal hemorrhoids  . HEMORRHOID BANDING N/A 05/17/2013   Procedure: HEMORRHOID BANDING;  Surgeon: [name], MD;  Location: [facility];  Service: Endoscopy;  Laterality: N/A;  2 bands placed  . INCISION AND DRAINAGE ABSCESS Left  10/11/2013   Procedure: INCISION AND DRAINAGE AND DEBRIDEMENT LEFT BREAST  ABSCESS;  Surgeon: [name], MD;  Location: [facility];  Service: General;  Laterality: Left;  . IRRIGATION AND DEBRIDEMENT ABSCESS Right 06/01/2013   Procedure: INCISION AND DRAINAGE AND DEBRIDEMENT ABSCESS RIGHT BREAST;  Surgeon: [name], MD;  Location: [facility];  Service: General;  Laterality: Right;  . SAVORY DILATION N/A 05/17/2013   Procedure: SAVORY DILATION;  Surgeon: [name], MD;  Location: [facility];  Service: Endoscopy;  Laterality: N/A;  14/15/16  . TUBAL LIGATION      OB History    Gravida Para Term Preterm AB Living   2 2 1 1   2    SAB TAB Ectopic Multiple Live Births                   Home Medications    Prior to Admission medications   Medication Sig Start Date End Date Taking? Authorizing Provider  albuterol (PROVENTIL HFA;VENTOLIN HFA) 108 (90 Base) MCG/ACT inhaler Inhale 2 puffs into the lungs every 4 (four) hours as needed for wheezing or shortness of breath. 07/23/16   Francine Graven, DO  albuterol (PROVENTIL) (2.5 MG/3ML) 0.083% nebulizer solution Take 2.5 mg by nebulization every 6 (six) hours as needed for wheezing or shortness of breath.     Historical Provider, MD  albuterol (PROVENTIL) (2.5 MG/3ML) 0.083% nebulizer solution Inhale 3 mLs (2.5 mg total) into the lungs 3 (three) times daily. 10/29/16   Sinda Du, MD  ALPRAZolam Duanne Moron) 1 MG tablet Take 1 mg by mouth 4 (four) times daily.      Historical Provider, MD  atorvastatin (LIPITOR) 10 MG tablet Take 10 mg by mouth daily. Take along with 20 mg tablet to equal 30 mg daily.    Historical Provider, MD  atorvastatin (LIPITOR) 20 MG tablet Take 20 mg by mouth daily. Take along with 10 mg tablet to equal 30 mg daily.    Historical Provider, MD  baclofen (LIORESAL) 20 MG tablet Take 20 mg by mouth 3 (three) times daily as needed for muscle spasms.    Historical Provider, MD  carvedilol (COREG) 12.5 MG tablet Take 1  tablet (12.5 mg total) by mouth 2 (two) times daily with a meal. 05/26/16   Cherene Altes, MD  colchicine 0.6 MG tablet Take 1 tablet (0.6 mg total) by mouth daily. 10/29/16   Sinda Du, MD  fluticasone (FLONASE) 50 MCG/ACT nasal spray Place 2 sprays into both nostrils at bedtime.    Historical Provider, MD  furosemide (LASIX) 20 MG tablet Take 20 mg by mouth daily as needed for edema.    Historical Provider, MD  hydrALAZINE (APRESOLINE) 25 MG tablet Take 1 tablet (25 mg total) by mouth every 8 (eight) hours. 10/29/16   Sinda Du, MD  HYDROcodone-acetaminophen (NORCO/VICODIN) 5-325 MG tablet Take 1 tablet by mouth every 4 (four) hours as needed for moderate pain.    Historical Provider, MD  lisinopril-hydrochlorothiazide (PRINZIDE,ZESTORETIC) 20-12.5 MG tablet Take 1 tablet by mouth daily.    Historical Provider, MD  loperamide (IMODIUM) 2 MG capsule Take 1 capsule (  2 mg total) by mouth 4 (four) times daily as needed for diarrhea or loose stools. 10/23/16   Evalee Jefferson, PA-C  metoCLOPramide (REGLAN) 10 MG tablet Take 1 tablet (10 mg total) by mouth every 6 (six) hours as needed for nausea (or headache). 08/30/53   Delora Fuel, MD  PARoxetine (PAXIL) 20 MG tablet Take 1 daily. This is to prevent panic attacks 10/28/16   Sinda Du, MD  polyethylene glycol [facility] Floria Raveling) packet Take 17 g by mouth daily. 10/30/16   Sinda Du, MD  predniSONE (DELTASONE) 10 MG tablet Take 6 tablets day one, 5 tablets day two, 4 tablets day three, 3 tablets day four, 2 tablets day five, then 1 tablet day six 10/23/16   Evalee Jefferson, PA-C  rOPINIRole (REQUIP) 1 MG tablet Take 1 mg by mouth at bedtime.     Historical Provider, MD    Family History Family History  Problem Relation Age of Onset  . Adopted: Yes  . Family history unknown: Yes    Social History Social History  Substance Use Topics  . Smoking status: Former Smoker    Packs/day: 1.50    Years: 25.00    Types: Cigarettes    Quit date:  10/23/2007  . Smokeless tobacco: Former Systems developer     Comment: Quit x 6 years ago  . Alcohol use No     Allergies   Codeine   Review of Systems Review of Systems  Constitutional: Negative for chills and fever.  Respiratory: Negative for cough.   All other systems reviewed and are negative.    Physical Exam Updated Vital Signs BP 120/65 (BP Location: Left Arm)   Pulse 84   Temp 97.5 F (36.4 C) (Oral)   Resp 14   Ht 5\' 7"  (1.702 m)   SpO2 95%   Physical Exam  Constitutional: She appears well-developed and well-nourished. No distress.  HENT:  Head: Normocephalic and atraumatic.  Neck: Neck supple.  Cardiovascular: Normal rate, regular rhythm and normal heart sounds.   No murmur heard. Pulmonary/Chest: Effort normal and breath sounds normal. No respiratory distress. She has no wheezes. She has no rales.  Musculoskeletal: Normal range of motion.  Neurological: She is alert.  Skin: Skin is warm and dry.  Nursing note and vitals reviewed.    ED Treatments / Results  DIAGNOSTIC STUDIES: Oxygen Saturation is 95% on RA, normal by my interpretation.   COORDINATION OF CARE: 4:37 PM-Discussed next steps with pt. Pt verbalized understanding and is agreeable with the plan.   Labs (all labs ordered are listed, but only abnormal results are displayed) Labs Reviewed - No data to display  EKG  EKG Interpretation None       Radiology No results found.  Procedures Procedures (including critical care time)  Medications Ordered in ED Medications - No data to display   Initial Impression / Assessment and Plan / ED Course  I have reviewed the triage vital signs and the nursing notes.  Pertinent labs & imaging results that were available during my care of the patient were reviewed by me and considered in my medical decision making (see chart for details).     Resp therapy in to see pt,  Evaluated mask and adjusted.  Rn spoke to [employer] and they will  assist tomorrow if problem persist  Final Clinical Impressions(s) / ED Diagnoses   Final diagnoses:  CPAP use counseling    New Prescriptions New Prescriptions   No medications on file  An After Visit Summary was printed and given to the patient.  I personally performed the services in this documentation, which was scribed in my presence.  The recorded information has been reviewed and considered.   Ronnald Collum.     Hollace Kinnier Mascot, PA-C 11/09/16 2143    Dorie Rank, MD 11/10/16 [phone]

## 2016-11-12 ENCOUNTER — Telehealth (HOSPITAL_COMMUNITY): Payer: Self-pay | Admitting: *Deleted

## 2016-11-12 NOTE — Telephone Encounter (Signed)
phone call regarding appointment, voice mail not set up yet.

## 2016-11-14 ENCOUNTER — Encounter (HOSPITAL_COMMUNITY): Payer: Self-pay | Admitting: Emergency Medicine

## 2016-11-14 ENCOUNTER — Inpatient Hospital Stay (HOSPITAL_COMMUNITY)
Admission: EM | Admit: 2016-11-14 | Discharge: 2016-11-19 | DRG: 683 | Disposition: A | Discharge status: Home / Self Care | Payer: Medicaid Other | Attending: Pulmonary Disease | Admitting: Pulmonary Disease

## 2016-11-14 DIAGNOSIS — E1122 Type 2 diabetes mellitus with diabetic chronic kidney disease: Secondary | ICD-10-CM | POA: Diagnosis present

## 2016-11-14 DIAGNOSIS — N179 Acute kidney failure, unspecified: Secondary | ICD-10-CM | POA: Diagnosis present

## 2016-11-14 DIAGNOSIS — G4733 Obstructive sleep apnea (adult) (pediatric): Secondary | ICD-10-CM | POA: Diagnosis present

## 2016-11-14 DIAGNOSIS — G4730 Sleep apnea, unspecified: Secondary | ICD-10-CM | POA: Diagnosis present

## 2016-11-14 DIAGNOSIS — R358 Other polyuria: Secondary | ICD-10-CM | POA: Diagnosis not present

## 2016-11-14 DIAGNOSIS — R519 Headache, unspecified: Secondary | ICD-10-CM

## 2016-11-14 DIAGNOSIS — R51 Headache: Secondary | ICD-10-CM

## 2016-11-14 DIAGNOSIS — Z9181 History of falling: Secondary | ICD-10-CM

## 2016-11-14 DIAGNOSIS — R200 Anesthesia of skin: Secondary | ICD-10-CM | POA: Diagnosis present

## 2016-11-14 DIAGNOSIS — Z87891 Personal history of nicotine dependence: Secondary | ICD-10-CM

## 2016-11-14 DIAGNOSIS — E785 Hyperlipidemia, unspecified: Secondary | ICD-10-CM | POA: Diagnosis present

## 2016-11-14 DIAGNOSIS — R197 Diarrhea, unspecified: Secondary | ICD-10-CM | POA: Diagnosis not present

## 2016-11-14 DIAGNOSIS — Z79891 Long term (current) use of opiate analgesic: Secondary | ICD-10-CM

## 2016-11-14 DIAGNOSIS — Z6841 Body Mass Index (BMI) 40.0 and over, adult: Secondary | ICD-10-CM

## 2016-11-14 DIAGNOSIS — N170 Acute kidney failure with tubular necrosis: Principal | ICD-10-CM | POA: Diagnosis present

## 2016-11-14 DIAGNOSIS — M79643 Pain in unspecified hand: Secondary | ICD-10-CM | POA: Diagnosis not present

## 2016-11-14 DIAGNOSIS — M1990 Unspecified osteoarthritis, unspecified site: Secondary | ICD-10-CM | POA: Diagnosis present

## 2016-11-14 DIAGNOSIS — Z7951 Long term (current) use of inhaled steroids: Secondary | ICD-10-CM

## 2016-11-14 DIAGNOSIS — I10 Essential (primary) hypertension: Secondary | ICD-10-CM | POA: Diagnosis present

## 2016-11-14 DIAGNOSIS — Z9049 Acquired absence of other specified parts of digestive tract: Secondary | ICD-10-CM

## 2016-11-14 DIAGNOSIS — R2681 Unsteadiness on feet: Secondary | ICD-10-CM | POA: Diagnosis present

## 2016-11-14 DIAGNOSIS — G2581 Restless legs syndrome: Secondary | ICD-10-CM | POA: Diagnosis present

## 2016-11-14 DIAGNOSIS — Z794 Long term (current) use of insulin: Secondary | ICD-10-CM

## 2016-11-14 DIAGNOSIS — F319 Bipolar disorder, unspecified: Secondary | ICD-10-CM | POA: Diagnosis present

## 2016-11-14 DIAGNOSIS — G44201 Tension-type headache, unspecified, intractable: Secondary | ICD-10-CM | POA: Diagnosis present

## 2016-11-14 DIAGNOSIS — K219 Gastro-esophageal reflux disease without esophagitis: Secondary | ICD-10-CM | POA: Diagnosis present

## 2016-11-14 DIAGNOSIS — Z885 Allergy status to narcotic agent status: Secondary | ICD-10-CM

## 2016-11-14 DIAGNOSIS — F410 Panic disorder [episodic paroxysmal anxiety] without agoraphobia: Secondary | ICD-10-CM | POA: Diagnosis present

## 2016-11-14 DIAGNOSIS — E8889 Other specified metabolic disorders: Secondary | ICD-10-CM | POA: Diagnosis present

## 2016-11-14 DIAGNOSIS — I120 Hypertensive chronic kidney disease with stage 5 chronic kidney disease or end stage renal disease: Secondary | ICD-10-CM | POA: Diagnosis present

## 2016-11-14 DIAGNOSIS — J449 Chronic obstructive pulmonary disease, unspecified: Secondary | ICD-10-CM | POA: Diagnosis present

## 2016-11-14 DIAGNOSIS — Z79899 Other long term (current) drug therapy: Secondary | ICD-10-CM

## 2016-11-14 DIAGNOSIS — E119 Type 2 diabetes mellitus without complications: Secondary | ICD-10-CM

## 2016-11-14 DIAGNOSIS — N185 Chronic kidney disease, stage 5: Secondary | ICD-10-CM | POA: Diagnosis present

## 2016-11-14 HISTORY — DX: Panic disorder (episodic paroxysmal anxiety): F41.0

## 2016-11-14 MED ORDER — DIPHENHYDRAMINE HCL 50 MG/ML IJ SOLN
25.0000 mg | Freq: Once | INTRAMUSCULAR | Status: AC
  Administered 2016-11-15: 25 mg via INTRAVENOUS
  Filled 2016-11-14: qty 1

## 2016-11-14 MED ORDER — METOCLOPRAMIDE HCL 5 MG/ML IJ SOLN
10.0000 mg | Freq: Once | INTRAMUSCULAR | Status: AC
  Administered 2016-11-15: 10 mg via INTRAVENOUS
  Filled 2016-11-14: qty 2

## 2016-11-14 NOTE — ED Triage Notes (Signed)
Pt reports a 2 day history of headache- She has a flat affect and request after triage to wait in the small waiting area behind triage

## 2016-11-14 NOTE — ED Provider Notes (Signed)
[facility] DEPT Provider Note   CSN: [id number] Arrival date & time: 11/14/16  1958   By signing my name below, I, Martha [name], attest that this documentation has been prepared under the direction and in the presence of Martha Fraise, MD. Electronically signed, Martha [name], ED Scribe. 11/14/16. 12:21 AM.   History   Chief Complaint Chief Complaint  Patient presents with  . Headache    x 2 days   The history is provided by the patient and medical records. No language interpreter was used.  Headache   This is a new problem. The current episode started yesterday. The problem occurs constantly. The problem has not changed since onset.The headache is associated with an unknown factor. The pain is located in the occipital region. The quality of the pain is described as throbbing. The pain is at a severity of 8/10. The pain is moderate. The pain does not radiate. Pertinent negatives include no fever, no palpitations, no syncope, no shortness of breath, no nausea and no vomiting. She has tried nothing for the symptoms.    Martha [name] is a 51 y.o. female with h/o DM and HTN, transported via significant other to the Emergency Department with concern for unchanged posterior headache onset yesterday. Pt reports associated extremity weakness and numbness, gait problem x 2 days stating she needs assistance walking or she will fall. Pt describes 8/10, constant spastic posterior head pain. No other modifying factors noted. No h/o similar symptoms noted. Pt seen 13 times in the past 6 months. Pt recently started on new medication that was increased ~1 week ago. No fever, vomit, chest pain, neck pain, back pain, visual changes, rash, fall, LOC, syncope or any other complaints at this time.   Past Medical History:  Diagnosis Date  . Acid reflux   . Anemia   . Arthritis   . Bipolar 1 disorder ([place])   . Cervical radiculopathy   . COPD (chronic obstructive pulmonary disease) ([place]) 2014   bronchitis  . Degenerative disc disease, thoracic   . Depression   . Diabetes mellitus   . Hypertension   . Noncompliance with medication regimen   . Obesity (BMI 30-39.9)   . OSA (obstructive sleep apnea)    cpap  . Panic attack   . RLS (restless legs syndrome)     Patient Active Problem List   Diagnosis Date Noted  . Chest pain 10/27/2016  . CKD (chronic kidney disease) stage 5, GFR less than 15 ml/min (HCC) 10/27/2016  . Uncontrolled type 2 diabetes mellitus with hyperglycemia, without long-term current use of insulin ([place]) 10/27/2016  . Sensory disturbance 10/27/2016  . Left hemiparesis ([place]) 10/27/2016  . Acute on chronic renal failure ([place]) 05/25/2016  . Abdominal pain 04/26/2014  . Cholelithiasis 04/26/2014  . Acute renal failure ([place]) 04/26/2014  . Dehydration 04/26/2014  . Hyponatremia 04/26/2014  . Gallstones 04/06/2014  . Uncontrolled diabetes mellitus ([place]) 10/09/2013  . Breast abscess 05/30/2013  . Diabetes mellitus, type 2 ([place]) 05/30/2013  . Hyperglycemia 05/30/2013  . Internal hemorrhoids with other complication 62/69/4854  . Umbilical hernia without mention of obstruction or gangrene 04/15/2013  . Anemia 04/15/2013  . DM 05/13/2010  . Hyperlipidemia 05/13/2010  . OBESITY 05/13/2010  . Depression 05/13/2010  . RESTLESS LEG SYNDROME 05/13/2010  . Essential hypertension 05/13/2010  . GERD 05/13/2010  . RECTAL BLEEDING 05/13/2010  . Sleep apnea 05/13/2010  . Dysphagia, oropharyngeal phase 05/13/2010    Past Surgical History:  Procedure Laterality Date  . BIOPSY  N/A 05/17/2013   Procedure: BIOPSY;  Surgeon: [name], MD;  Location: [facility];  Service: Endoscopy;  Laterality: N/A;  . CHOLECYSTECTOMY N/A 04/27/2014   Procedure: LAPAROSCOPIC CHOLECYSTECTOMY;  Surgeon: [name], MD;  Location: [facility];  Service: General;  Laterality: N/A;  . COLONOSCOPY  06/06/2010   ELF:YBOFBP bleeding secondary to internal hemorrhoids but incomplete  evaluation secondary to poor right colon prep/small rectal and sigmoid colon polyps (hyperplastic). PROPOFOL  . COLONOSCOPY  May 2013   Dr. Gilliam/[facility]: 5 mm a descending colon polyp, hyperplastic. Adequate bowel prep.  . ENDOMETRIAL ABLATION    . ESOPHAGOGASTRODUODENOSCOPY  06/06/2010   ZWC:HENIDPO erythema and edema of body of stomach, with sessile polypoid lesions. bx benign. no h.pylori  . ESOPHAGOGASTRODUODENOSCOPY (EGD) WITH PROPOFOL N/A 05/17/2013   Dr. Oneida Alar: normal esophagus, moderate nodular gastritis, negative path, empiric Savary dilation  . FLEXIBLE SIGMOIDOSCOPY N/A 05/17/2013   Dr. Oneida Alar: moderate sized internal hemorrhoids  . HEMORRHOID BANDING N/A 05/17/2013   Procedure: HEMORRHOID BANDING;  Surgeon: [name], MD;  Location: [facility];  Service: Endoscopy;  Laterality: N/A;  2 bands placed  . INCISION AND DRAINAGE ABSCESS Left 10/11/2013   Procedure: INCISION AND DRAINAGE AND DEBRIDEMENT LEFT BREAST  ABSCESS;  Surgeon: [name], MD;  Location: [facility];  Service: General;  Laterality: Left;  . IRRIGATION AND DEBRIDEMENT ABSCESS Right 06/01/2013   Procedure: INCISION AND DRAINAGE AND DEBRIDEMENT ABSCESS RIGHT BREAST;  Surgeon: [name], MD;  Location: [facility];  Service: General;  Laterality: Right;  . SAVORY DILATION N/A 05/17/2013   Procedure: SAVORY DILATION;  Surgeon: [name], MD;  Location: [facility];  Service: Endoscopy;  Laterality: N/A;  14/15/16  . TUBAL LIGATION      OB History    Gravida Para Term Preterm AB Living   2 2 1 1   2    SAB TAB Ectopic Multiple Live Births                   Home Medications    Prior to Admission medications   Medication Sig Start Date End Date Taking? Authorizing Provider  albuterol (PROVENTIL HFA;VENTOLIN HFA) 108 (90 Base) MCG/ACT inhaler Inhale 2 puffs into the lungs every 4 (four) hours as needed for wheezing or shortness of breath. 07/23/16  Yes Francine Graven, DO  albuterol (PROVENTIL) (2.5  MG/3ML) 0.083% nebulizer solution Inhale 3 mLs (2.5 mg total) into the lungs 3 (three) times daily. 10/29/16  Yes Sinda Du, MD  ALPRAZolam Duanne Moron) 1 MG tablet Take 1 mg by mouth 4 (four) times daily.     Yes Historical Provider, MD  atorvastatin (LIPITOR) 10 MG tablet Take 10 mg by mouth daily. Take along with 20 mg tablet to equal 30 mg daily.   Yes Historical Provider, MD  atorvastatin (LIPITOR) 20 MG tablet Take 20 mg by mouth daily. Take along with 10 mg tablet to equal 30 mg daily.   Yes Historical Provider, MD  baclofen (LIORESAL) 20 MG tablet Take 20 mg by mouth 3 (three) times daily as needed for muscle spasms.   Yes Historical Provider, MD  carvedilol (COREG) 12.5 MG tablet Take 1 tablet (12.5 mg total) by mouth 2 (two) times daily with a meal. 05/26/16  Yes Cherene Altes, MD  colchicine 0.6 MG tablet Take 1 tablet (0.6 mg total) by mouth daily. 10/29/16  Yes Sinda Du, MD  fluticasone (FLONASE) 50 MCG/ACT nasal spray Place 2 sprays into both nostrils at  bedtime.   Yes Historical Provider, MD  hydrALAZINE (APRESOLINE) 25 MG tablet Take 1 tablet (25 mg total) by mouth every 8 (eight) hours. 10/29/16  Yes Sinda Du, MD  HYDROcodone-acetaminophen (NORCO/VICODIN) 5-325 MG tablet Take 1 tablet by mouth every 4 (four) hours as needed for moderate pain.   Yes Historical Provider, MD  lisinopril-hydrochlorothiazide (PRINZIDE,ZESTORETIC) 20-12.5 MG tablet Take 1 tablet by mouth daily.   Yes Historical Provider, MD  metoCLOPramide (REGLAN) 10 MG tablet Take 1 tablet (10 mg total) by mouth every 6 (six) hours as needed for nausea (or headache). 08/30/95  Yes Delora Fuel, MD  PARoxetine (PAXIL) 20 MG tablet Take 1 daily. This is to prevent panic attacks Patient taking differently: Take 20 mg by mouth daily. Take 1 daily. This is to prevent panic attacks 10/28/16  Yes Sinda Du, MD  polyethylene glycol [facility] / GLYCOLAX) packet Take 17 g by mouth daily. 10/30/16  Yes Sinda Du, MD    rOPINIRole (REQUIP) 1 MG tablet Take 1 mg by mouth at bedtime.    Yes Historical Provider, MD  furosemide (LASIX) 20 MG tablet Take 20 mg by mouth daily as needed for edema.    Historical Provider, MD  loperamide (IMODIUM) 2 MG capsule Take 1 capsule (2 mg total) by mouth 4 (four) times daily as needed for diarrhea or loose stools. 10/23/16   Evalee Jefferson, PA-C  predniSONE (DELTASONE) 10 MG tablet Take 6 tablets day one, 5 tablets day two, 4 tablets day three, 3 tablets day four, 2 tablets day five, then 1 tablet day six Patient not taking: Reported on 11/14/2016 10/23/16   Evalee Jefferson, PA-C    Family History Family History  Problem Relation Age of Onset  . Adopted: Yes  . Family history unknown: Yes    Social History Social History  Substance Use Topics  . Smoking status: Former Smoker    Packs/day: 1.50    Years: 25.00    Types: Cigarettes    Quit date: 10/23/2007  . Smokeless tobacco: Former Systems developer     Comment: Quit x 6 years ago  . Alcohol use No     Allergies   Codeine   Review of Systems Review of Systems  Constitutional: Negative for fever.  Eyes: Negative for visual disturbance.  Respiratory: Negative for shortness of breath.   Cardiovascular: Negative for chest pain, palpitations and syncope.  Gastrointestinal: Negative for nausea and vomiting.  Musculoskeletal: Positive for gait problem. Negative for back pain and neck pain.  Skin: Negative for rash.  Neurological: Positive for weakness, numbness and headaches. Negative for syncope.  All other systems reviewed and are negative.   Physical Exam Updated Vital Signs BP 107/65 (BP Location: Right Arm)   Pulse 94   Temp 97.5 F (36.4 C) (Oral)   Resp 18   Ht 5\' 7"  (1.702 m)   Wt 266 lb (120.7 kg)   SpO2 99%   BMI 41.66 kg/m   Physical Exam CONSTITUTIONAL: Well developed/well nourished, flat affect HEAD: Normocephalic/atraumatic EYES: EOMI/PERRL, no nystagmus,  no ptosis ENMT: Mucous membranes moist NECK:  supple no meningeal signs, no bruits CV: S1/S2 noted, no murmurs/rubs/gallops noted LUNGS: Lungs are clear to auscultation bilaterally, no apparent distress ABDOMEN: soft, nontender, no rebound or guarding GU:no cva tenderness NEURO:Awake/alert, face symmetric, no arm or leg drift is noted Equal 5/5 strength with shoulder abduction, elbow flex/extension, wrist flex/extension in upper extremities and equal hand grips bilaterally Equal 5/5 strength with hip flexion,knee flex/extension, foot dorsi/plantar flexion Cranial  nerves 3/4/5/6/02/02/09/11/12 tested and intact Wide based gait noted, pt must walk with assistance No past pointing EXTREMITIES: pulses normal, full ROM SKIN: warm, color normal PSYCH: flat affect  ED Treatments / Results  DIAGNOSTIC STUDIES: Oxygen Saturation is 99% on RA, NL by my interpretation.    COORDINATION OF CARE: 11:23 PM-Discussed next steps with pt. Pt verbalized understanding and is agreeable with the plan. Will review records and place orders.   Labs (all labs ordered are listed, but only abnormal results are displayed) Labs Reviewed  CBC - Abnormal; Notable for the following:       Result Value   Platelets 145 (*)    All other components within normal limits  COMPREHENSIVE METABOLIC PANEL - Abnormal; Notable for the following:    Glucose, Bld 103 (*)    BUN 76 (*)    Creatinine, Ser 6.78 (*)    AST 14 (*)    GFR calc non Af Amer 6 (*)    GFR calc Af Amer 8 (*)    All other components within normal limits  ETHANOL  DIFFERENTIAL  RAPID URINE DRUG SCREEN, HOSP PERFORMED  URINALYSIS, ROUTINE W REFLEX MICROSCOPIC    EKG  EKG Interpretation  Date/Time:  Saturday November 15 2016 00:01:46 EDT Ventricular Rate:  75 PR Interval:    QRS Duration: 77 QT Interval:  379 QTC Calculation: 424 R Axis:   40 Text Interpretation:  Sinus rhythm Nonspecific T abnormalities, lateral leads No significant change since last tracing Confirmed by Christy Gentles  MD,  Vivek Grealish ([id number]) on 11/15/2016 12:18:18 AM       Radiology No results found.  Procedures Procedures (including critical care time)  Medications Ordered in ED   Initial Impression / Assessment and Plan / ED Course  I have reviewed the triage vital signs and the nursing notes.  Pertinent labs  results that were available during my care of the patient were reviewed by me and considered in my medical decision making (see chart for details).     9:32 AM Pt with complicating history.  She reports her main issues was HA and difficulty walking for at least 2 days For HA - this is an ongoing issue, she reports similar episodes before.  She just had CT head earlier this month that was normal She also has some difficulty ambulating, though this could be due to underlying metabolic issue.  CT head deferred for now.  She has no gross neuro deficits  More concerning is her worsening renal function.  Since earlier in the month, her creatinine is now near 7.  She reports recent diarrhea.  She reports she is oliguric (only urinates 1-2/day recently) and she is on diuretics which could worsen her renal function.  She also recently started paxil.  Due to worsening renal function as well as worsening blood pressure (hydralazine ordered) Will admit patient Patient/significant other agreeable with plan  D/w dr Gloris Ham to admit to Dr Luan Pulling service  Final Clinical Impressions(s) / ED Diagnoses   Final diagnoses:  AKI (acute kidney injury) ([place])  Acute intractable tension-type headache    New Prescriptions New Prescriptions   No medications on file  I personally performed the services described in this documentation, which was scribed in my presence. The recorded information has been reviewed and is accurate.        Martha Fraise, MD 11/15/16 [phone]

## 2016-11-15 ENCOUNTER — Encounter (HOSPITAL_COMMUNITY): Payer: Self-pay | Admitting: *Deleted

## 2016-11-15 ENCOUNTER — Observation Stay (HOSPITAL_COMMUNITY): Payer: Medicaid Other

## 2016-11-15 DIAGNOSIS — Z7951 Long term (current) use of inhaled steroids: Secondary | ICD-10-CM | POA: Diagnosis not present

## 2016-11-15 DIAGNOSIS — N179 Acute kidney failure, unspecified: Secondary | ICD-10-CM | POA: Diagnosis present

## 2016-11-15 DIAGNOSIS — Z87891 Personal history of nicotine dependence: Secondary | ICD-10-CM | POA: Diagnosis not present

## 2016-11-15 DIAGNOSIS — G44201 Tension-type headache, unspecified, intractable: Secondary | ICD-10-CM | POA: Diagnosis present

## 2016-11-15 DIAGNOSIS — I10 Essential (primary) hypertension: Secondary | ICD-10-CM

## 2016-11-15 DIAGNOSIS — Z79899 Other long term (current) drug therapy: Secondary | ICD-10-CM | POA: Diagnosis not present

## 2016-11-15 DIAGNOSIS — G4733 Obstructive sleep apnea (adult) (pediatric): Secondary | ICD-10-CM | POA: Diagnosis present

## 2016-11-15 DIAGNOSIS — Z794 Long term (current) use of insulin: Secondary | ICD-10-CM | POA: Diagnosis not present

## 2016-11-15 DIAGNOSIS — R2681 Unsteadiness on feet: Secondary | ICD-10-CM | POA: Diagnosis present

## 2016-11-15 DIAGNOSIS — E1165 Type 2 diabetes mellitus with hyperglycemia: Secondary | ICD-10-CM

## 2016-11-15 DIAGNOSIS — N170 Acute kidney failure with tubular necrosis: Secondary | ICD-10-CM | POA: Diagnosis not present

## 2016-11-15 DIAGNOSIS — Z6841 Body Mass Index (BMI) 40.0 and over, adult: Secondary | ICD-10-CM | POA: Diagnosis not present

## 2016-11-15 DIAGNOSIS — F319 Bipolar disorder, unspecified: Secondary | ICD-10-CM | POA: Diagnosis present

## 2016-11-15 DIAGNOSIS — E1122 Type 2 diabetes mellitus with diabetic chronic kidney disease: Secondary | ICD-10-CM | POA: Diagnosis present

## 2016-11-15 DIAGNOSIS — R200 Anesthesia of skin: Secondary | ICD-10-CM | POA: Diagnosis present

## 2016-11-15 DIAGNOSIS — Z79891 Long term (current) use of opiate analgesic: Secondary | ICD-10-CM | POA: Diagnosis not present

## 2016-11-15 DIAGNOSIS — Z9049 Acquired absence of other specified parts of digestive tract: Secondary | ICD-10-CM | POA: Diagnosis not present

## 2016-11-15 DIAGNOSIS — E785 Hyperlipidemia, unspecified: Secondary | ICD-10-CM | POA: Diagnosis present

## 2016-11-15 DIAGNOSIS — K219 Gastro-esophageal reflux disease without esophagitis: Secondary | ICD-10-CM | POA: Diagnosis present

## 2016-11-15 DIAGNOSIS — N185 Chronic kidney disease, stage 5: Secondary | ICD-10-CM | POA: Diagnosis present

## 2016-11-15 DIAGNOSIS — I120 Hypertensive chronic kidney disease with stage 5 chronic kidney disease or end stage renal disease: Secondary | ICD-10-CM | POA: Diagnosis present

## 2016-11-15 DIAGNOSIS — J449 Chronic obstructive pulmonary disease, unspecified: Secondary | ICD-10-CM | POA: Diagnosis present

## 2016-11-15 DIAGNOSIS — G2581 Restless legs syndrome: Secondary | ICD-10-CM | POA: Diagnosis present

## 2016-11-15 DIAGNOSIS — E8889 Other specified metabolic disorders: Secondary | ICD-10-CM | POA: Diagnosis present

## 2016-11-15 DIAGNOSIS — F410 Panic disorder [episodic paroxysmal anxiety] without agoraphobia: Secondary | ICD-10-CM | POA: Diagnosis present

## 2016-11-15 DIAGNOSIS — Z885 Allergy status to narcotic agent status: Secondary | ICD-10-CM | POA: Diagnosis not present

## 2016-11-15 LAB — GLUCOSE, CAPILLARY
GLUCOSE-CAPILLARY: 142 mg/dL — AB (ref 65–99)
GLUCOSE-CAPILLARY: 115 mg/dL — AB (ref 65–99)
Glucose-Capillary: 149 mg/dL — ABNORMAL HIGH (ref 65–99)
Glucose-Capillary: 97 mg/dL (ref 65–99)

## 2016-11-15 LAB — DIFFERENTIAL
Basophils Absolute: 0 10*3/uL (ref 0.0–0.1)
Basophils Relative: 1 %
Eosinophils Absolute: 0.1 10*3/uL (ref 0.0–0.7)
Eosinophils Relative: 3 %
LYMPHS PCT: 34 %
Lymphs Abs: 1.7 10*3/uL (ref 0.7–4.0)
MONO ABS: 0.3 10*3/uL (ref 0.1–1.0)
Monocytes Relative: 7 %
NEUTROS ABS: 2.9 10*3/uL (ref 1.7–7.7)
Neutrophils Relative %: 55 %

## 2016-11-15 LAB — COMPREHENSIVE METABOLIC PANEL
ALT: 21 U/L (ref 14–54)
AST: 14 U/L — ABNORMAL LOW (ref 15–41)
Albumin: 3.9 g/dL (ref 3.5–5.0)
Alkaline Phosphatase: 72 U/L (ref 38–126)
Anion gap: 10 (ref 5–15)
BILIRUBIN TOTAL: 0.5 mg/dL (ref 0.3–1.2)
BUN: 76 mg/dL — ABNORMAL HIGH (ref 6–20)
CALCIUM: 9.3 mg/dL (ref 8.9–10.3)
CO2: 22 mmol/L (ref 22–32)
CREATININE: 6.78 mg/dL — AB (ref 0.44–1.00)
Chloride: 110 mmol/L (ref 101–111)
GFR calc non Af Amer: 6 mL/min — ABNORMAL LOW (ref >60)
GFR, EST AFRICAN AMERICAN: 8 mL/min — AB (ref >60)
Glucose, Bld: 103 mg/dL — ABNORMAL HIGH (ref 65–99)
Potassium: 5 mmol/L (ref 3.5–5.1)
Sodium: 142 mmol/L (ref 135–145)
Total Protein: 7 g/dL (ref 6.5–8.1)

## 2016-11-15 LAB — CBC
HEMATOCRIT: 37.5 % (ref 36.0–46.0)
Hemoglobin: 12.1 g/dL (ref 12.0–15.0)
MCH: 28.1 pg (ref 26.0–34.0)
MCHC: 32.3 g/dL (ref 30.0–36.0)
MCV: 87 fL (ref 78.0–100.0)
Platelets: 145 10*3/uL — ABNORMAL LOW (ref 150–400)
RBC: 4.31 MIL/uL (ref 3.87–5.11)
RDW: 15.3 % (ref 11.5–15.5)
WBC: 5.2 10*3/uL (ref 4.0–10.5)

## 2016-11-15 LAB — ETHANOL: Alcohol, Ethyl (B): <5 mg/dL (ref <5)

## 2016-11-15 LAB — TROPONIN I

## 2016-11-15 MED ORDER — METOCLOPRAMIDE HCL 10 MG PO TABS: 10.0000 mg | ORAL_TABLET | Freq: Four times a day (QID) | ORAL | Status: DC | PRN

## 2016-11-15 MED ORDER — LOPERAMIDE HCL 2 MG PO CAPS
2.0000 mg | ORAL_CAPSULE | Freq: Four times a day (QID) | ORAL | Status: DC | PRN
  Administered 2016-11-17 – 2016-11-18 (×2): 2 mg via ORAL
  Filled 2016-11-15 (×2): qty 1

## 2016-11-15 MED ORDER — COLCHICINE 0.6 MG PO TABS
0.6000 mg | ORAL_TABLET | ORAL | Status: DC
  Administered 2016-11-15 – 2016-11-19 (×3): 0.6 mg via ORAL
  Filled 2016-11-15 (×5): qty 1

## 2016-11-15 MED ORDER — ALPRAZOLAM 0.5 MG PO TABS
1.0000 mg | ORAL_TABLET | Freq: Four times a day (QID) | ORAL | Status: DC
  Administered 2016-11-15 – 2016-11-19 (×17): 1 mg via ORAL
  Filled 2016-11-15 (×17): qty 2

## 2016-11-15 MED ORDER — BACLOFEN 10 MG PO TABS: 20.0000 mg | ORAL_TABLET | Freq: Three times a day (TID) | ORAL | Status: DC | PRN

## 2016-11-15 MED ORDER — CARVEDILOL 12.5 MG PO TABS
12.5000 mg | ORAL_TABLET | Freq: Two times a day (BID) | ORAL | Status: DC
  Administered 2016-11-15 – 2016-11-19 (×8): 12.5 mg via ORAL
  Filled 2016-11-15 (×8): qty 1

## 2016-11-15 MED ORDER — COLCHICINE 0.6 MG PO TABS: 0.6000 mg | ORAL_TABLET | Freq: Every day | ORAL | Status: DC

## 2016-11-15 MED ORDER — HYDRALAZINE HCL 25 MG PO TABS
25.0000 mg | ORAL_TABLET | Freq: Once | ORAL | Status: AC
  Administered 2016-11-15: 25 mg via ORAL
  Filled 2016-11-15: qty 1

## 2016-11-15 MED ORDER — SODIUM CHLORIDE 0.9 % IV SOLN
INTRAVENOUS | Status: DC
  Administered 2016-11-15 – 2016-11-18 (×6): via INTRAVENOUS

## 2016-11-15 MED ORDER — PAROXETINE HCL 20 MG PO TABS
20.0000 mg | ORAL_TABLET | Freq: Every day | ORAL | Status: DC
  Administered 2016-11-15 – 2016-11-19 (×5): 20 mg via ORAL
  Filled 2016-11-15 (×5): qty 1

## 2016-11-15 MED ORDER — ALBUTEROL SULFATE (2.5 MG/3ML) 0.083% IN NEBU: 3.0000 mL | INHALATION_SOLUTION | RESPIRATORY_TRACT | Status: DC | PRN

## 2016-11-15 MED ORDER — ATORVASTATIN CALCIUM 10 MG PO TABS: 10.0000 mg | ORAL_TABLET | Freq: Every day | ORAL | Status: DC

## 2016-11-15 MED ORDER — POLYETHYLENE GLYCOL 3350 17 G PO PACK
17.0000 g | PACK | Freq: Every day | ORAL | Status: DC
  Administered 2016-11-16: 17 g via ORAL
  Filled 2016-11-15 (×2): qty 1

## 2016-11-15 MED ORDER — LISINOPRIL 10 MG PO TABS
20.0000 mg | ORAL_TABLET | Freq: Every day | ORAL | Status: DC
  Administered 2016-11-15 – 2016-11-16 (×2): 20 mg via ORAL
  Filled 2016-11-15 (×2): qty 2

## 2016-11-15 MED ORDER — ATORVASTATIN CALCIUM 10 MG PO TABS
30.0000 mg | ORAL_TABLET | Freq: Every day | ORAL | Status: DC
  Administered 2016-11-15 – 2016-11-19 (×5): 30 mg via ORAL
  Filled 2016-11-15 (×5): qty 3

## 2016-11-15 MED ORDER — ALBUTEROL SULFATE (2.5 MG/3ML) 0.083% IN NEBU
2.5000 mg | INHALATION_SOLUTION | Freq: Three times a day (TID) | RESPIRATORY_TRACT | Status: DC
  Administered 2016-11-15 – 2016-11-19 (×13): 2.5 mg via RESPIRATORY_TRACT
  Filled 2016-11-15 (×13): qty 3

## 2016-11-15 MED ORDER — HYDROCODONE-ACETAMINOPHEN 5-325 MG PO TABS
1.0000 | ORAL_TABLET | ORAL | Status: DC | PRN
  Administered 2016-11-15 – 2016-11-17 (×4): 1 via ORAL
  Filled 2016-11-15 (×6): qty 1

## 2016-11-15 MED ORDER — FLUTICASONE PROPIONATE 50 MCG/ACT NA SUSP
2.0000 | Freq: Every day | NASAL | Status: DC
  Administered 2016-11-15 – 2016-11-18 (×4): 2 via NASAL
  Filled 2016-11-15: qty 16

## 2016-11-15 MED ORDER — HYDROCHLOROTHIAZIDE 12.5 MG PO CAPS
12.5000 mg | ORAL_CAPSULE | Freq: Every day | ORAL | Status: DC
Start: 2016-11-15 — End: 2016-11-16
  Administered 2016-11-15 – 2016-11-16 (×2): 12.5 mg via ORAL
  Filled 2016-11-15 (×2): qty 1

## 2016-11-15 MED ORDER — SODIUM CHLORIDE 0.9 % IV BOLUS (SEPSIS)
250.0000 mL | Freq: Once | INTRAVENOUS | Status: AC
  Administered 2016-11-15: 250 mL via INTRAVENOUS

## 2016-11-15 MED ORDER — HYDRALAZINE HCL 25 MG PO TABS
25.0000 mg | ORAL_TABLET | Freq: Three times a day (TID) | ORAL | Status: DC
  Administered 2016-11-15 – 2016-11-19 (×13): 25 mg via ORAL
  Filled 2016-11-15 (×13): qty 1

## 2016-11-15 MED ORDER — HEPARIN SODIUM (PORCINE) 5000 UNIT/ML IJ SOLN
5000.0000 [IU] | Freq: Three times a day (TID) | INTRAMUSCULAR | Status: DC
  Administered 2016-11-15 – 2016-11-19 (×13): 5000 [IU] via SUBCUTANEOUS
  Filled 2016-11-15 (×13): qty 1

## 2016-11-15 MED ORDER — FUROSEMIDE 10 MG/ML IJ SOLN
80.0000 mg | Freq: Two times a day (BID) | INTRAMUSCULAR | Status: DC
Start: 2016-11-15 — End: 2016-11-16
  Administered 2016-11-15 – 2016-11-16 (×2): 80 mg via INTRAVENOUS
  Filled 2016-11-15 (×2): qty 8

## 2016-11-15 MED ORDER — ROPINIROLE HCL 1 MG PO TABS
1.0000 mg | ORAL_TABLET | Freq: Every day | ORAL | Status: DC
  Administered 2016-11-15 – 2016-11-18 (×4): 1 mg via ORAL
  Filled 2016-11-15 (×4): qty 1

## 2016-11-15 MED ORDER — SODIUM CHLORIDE 0.9 % IV BOLUS (SEPSIS)
1000.0000 mL | Freq: Once | INTRAVENOUS | Status: AC
  Administered 2016-11-15: 1000 mL via INTRAVENOUS

## 2016-11-15 MED ORDER — HYDRALAZINE HCL 20 MG/ML IJ SOLN: 5.0000 mg | INTRAMUSCULAR | Status: DC | PRN

## 2016-11-15 NOTE — Progress Notes (Signed)
This is an assumption of care note. She was admitted early this morning with acute on chronic renal failure. She complained of weakness and headache. She's not had nausea or vomiting. No active uremic symptoms. She says she has had some trouble breathing.  She is awake and alert. Her chest is clear. Abdomen is soft. She does have some tenderness of her shins.  For nephrology consultation. Depending on her response to treatment she may need dialysis

## 2016-11-15 NOTE — Consult Note (Signed)
Reason for Consult: Acute kidney injury superimposed on chronic Referring Physician: Dr. [name] is an 51 y.o. female.  HPI: She is a patient who has history of diabetes, hypertension, obstructive sleep apnea, presently came with complaints of weakness, headache. When she was evaluated patient was found to have significantly elevated BUN and creatinine and submitted to the hospital. Patient presently denies any nausea or vomiting. Her appetite however is very poor. Patient denies any orthopnea but she has some difficulty breathing on and off. Since she is making urine patient is not taking her diuretics to regular. Presently she is feeling better. Patient with history of chronic renal failure.  Past Medical History:  Diagnosis Date  . Acid reflux   . Anemia   . Arthritis   . Bipolar 1 disorder ([place])   . Cervical radiculopathy   . COPD (chronic obstructive pulmonary disease) ([place]) 2014   bronchitis  . Degenerative disc disease, thoracic   . Depression   . Diabetes mellitus   . Hypertension   . Noncompliance with medication regimen   . Obesity (BMI 30-39.9)   . OSA (obstructive sleep apnea)    cpap  . Panic attack   . RLS (restless legs syndrome)     Past Surgical History:  Procedure Laterality Date  . BIOPSY N/A 05/17/2013   Procedure: BIOPSY;  Surgeon: [name], MD;  Location: [facility];  Service: Endoscopy;  Laterality: N/A;  . CHOLECYSTECTOMY N/A 04/27/2014   Procedure: LAPAROSCOPIC CHOLECYSTECTOMY;  Surgeon: [name], MD;  Location: [facility];  Service: General;  Laterality: N/A;  . COLONOSCOPY  06/06/2010   XNA:TFTDDU bleeding secondary to internal hemorrhoids but incomplete evaluation secondary to poor right colon prep/small rectal and sigmoid colon polyps (hyperplastic). PROPOFOL  . COLONOSCOPY  May 2013   Dr. Gilliam/[facility]: 5 mm a descending colon polyp, hyperplastic. Adequate bowel prep.  . ENDOMETRIAL ABLATION    . ESOPHAGOGASTRODUODENOSCOPY   06/06/2010   KGU:RKYHCWC erythema and edema of body of stomach, with sessile polypoid lesions. bx benign. no h.pylori  . ESOPHAGOGASTRODUODENOSCOPY (EGD) WITH PROPOFOL N/A 05/17/2013   Dr. Oneida Alar: normal esophagus, moderate nodular gastritis, negative path, empiric Savary dilation  . FLEXIBLE SIGMOIDOSCOPY N/A 05/17/2013   Dr. Oneida Alar: moderate sized internal hemorrhoids  . HEMORRHOID BANDING N/A 05/17/2013   Procedure: HEMORRHOID BANDING;  Surgeon: [name], MD;  Location: [facility];  Service: Endoscopy;  Laterality: N/A;  2 bands placed  . INCISION AND DRAINAGE ABSCESS Left 10/11/2013   Procedure: INCISION AND DRAINAGE AND DEBRIDEMENT LEFT BREAST  ABSCESS;  Surgeon: [name], MD;  Location: [facility];  Service: General;  Laterality: Left;  . IRRIGATION AND DEBRIDEMENT ABSCESS Right 06/01/2013   Procedure: INCISION AND DRAINAGE AND DEBRIDEMENT ABSCESS RIGHT BREAST;  Surgeon: [name], MD;  Location: [facility];  Service: General;  Laterality: Right;  . SAVORY DILATION N/A 05/17/2013   Procedure: SAVORY DILATION;  Surgeon: [name], MD;  Location: [facility];  Service: Endoscopy;  Laterality: N/A;  14/15/16  . TUBAL LIGATION      Family History  Problem Relation Age of Onset  . Adopted: Yes  . Family history unknown: Yes    Social History:  reports that she quit smoking about 9 years ago. Her smoking use included Cigarettes. She has a 37.50 pack-year smoking history. She has quit using smokeless tobacco. She reports that she does not drink alcohol or use drugs.  Allergies:  Allergies  Allergen Reactions  .  Codeine Nausea And Vomiting    Medications: I have reviewed the patient's current medications.  Results for orders placed or performed during the hospital encounter of 11/14/16 (from the past 48 hour(s))  Ethanol     Status: None   Collection Time: 11/15/16 12:32 AM  Result Value Ref Range   Alcohol, Ethyl (B) <5 <5 mg/dL    Comment:        LOWEST DETECTABLE  LIMIT FOR SERUM ALCOHOL IS 5 mg/dL FOR MEDICAL PURPOSES ONLY   CBC     Status: Abnormal   Collection Time: 11/15/16 12:32 AM  Result Value Ref Range   WBC 5.2 4.0 - 10.5 K/uL   RBC 4.31 3.87 - 5.11 MIL/uL   Hemoglobin 12.1 12.0 - 15.0 g/dL   HCT 37.5 36.0 - 46.0 %   MCV 87.0 78.0 - 100.0 fL   MCH 28.1 26.0 - 34.0 pg   MCHC 32.3 30.0 - 36.0 g/dL   RDW 15.3 11.5 - 15.5 %   Platelets 145 (L) 150 - 400 K/uL  Differential     Status: None   Collection Time: 11/15/16 12:32 AM  Result Value Ref Range   Neutrophils Relative % 55 %   Neutro Abs 2.9 1.7 - 7.7 K/uL   Lymphocytes Relative 34 %   Lymphs Abs 1.7 0.7 - 4.0 K/uL   Monocytes Relative 7 %   Monocytes Absolute 0.3 0.1 - 1.0 K/uL   Eosinophils Relative 3 %   Eosinophils Absolute 0.1 0.0 - 0.7 K/uL   Basophils Relative 1 %   Basophils Absolute 0.0 0.0 - 0.1 K/uL  Comprehensive metabolic panel     Status: Abnormal   Collection Time: 11/15/16 12:32 AM  Result Value Ref Range   Sodium 142 135 - 145 mmol/L   Potassium 5.0 3.5 - 5.1 mmol/L   Chloride 110 101 - 111 mmol/L   CO2 22 22 - 32 mmol/L   Glucose, Bld 103 (H) 65 - 99 mg/dL   BUN 76 (H) 6 - 20 mg/dL   Creatinine, Ser 6.78 (H) 0.44 - 1.00 mg/dL   Calcium 9.3 8.9 - 10.3 mg/dL   Total Protein 7.0 6.5 - 8.1 g/dL   Albumin 3.9 3.5 - 5.0 g/dL   AST 14 (L) 15 - 41 U/L   ALT 21 14 - 54 U/L   Alkaline Phosphatase 72 38 - 126 U/L   Total Bilirubin 0.5 0.3 - 1.2 mg/dL   GFR calc non Af Amer 6 (L) >60 mL/min   GFR calc Af Amer 8 (L) >60 mL/min    Comment: (NOTE) The eGFR has been calculated using the CKD EPI equation. This calculation has not been validated in all clinical situations. eGFR's persistently <60 mL/min signify possible Chronic Kidney Disease.    Anion gap 10 5 - 15  Glucose, capillary     Status: Abnormal   Collection Time: 11/15/16  8:06 AM  Result Value Ref Range   Glucose-Capillary 142 (H) 65 - 99 mg/dL   Comment 1 Notify RN    Comment 2 Document in Chart      Ct Head Wo Contrast  Result Date: 11/15/2016 CLINICAL DATA:  Two day history of headache. Patient woke from sleep with bilateral arm and leg weakness. EXAM: CT HEAD WITHOUT CONTRAST TECHNIQUE: Contiguous axial images were obtained from the base of the skull through the vertex without intravenous contrast. COMPARISON:  10/27/2016 FINDINGS: Brain: No evidence of acute infarction, hemorrhage, hydrocephalus, extra-axial collection or mass lesion/mass effect.  Vascular: No hyperdense vessel or unexpected calcification. Skull: Normal. Negative for fracture or focal lesion. Sinuses/Orbits: Probable old medial wall fracture of the right orbit. Paranasal sinuses and mastoid air cells are clear. Other: No change since prior study. IMPRESSION: No acute intracranial abnormalities. Electronically Signed   By: Lucienne Capers M.D.   On: 11/15/2016 03:40    Review of Systems  Constitutional: Negative for fever.  Respiratory: Negative for cough and shortness of breath.   Cardiovascular: Negative for orthopnea, leg swelling and PND.  Gastrointestinal: Negative for nausea and vomiting.  Musculoskeletal: Positive for back pain.  Neurological: Positive for weakness.   Blood pressure (!) 157/54, pulse 98, temperature 98.1 F (36.7 C), temperature source Oral, resp. rate 20, height _0  (1.702 m), weight 120.7 kg (266 lb), SpO2 98 %. Physical Exam  Constitutional: No distress.  Eyes: No scleral icterus.  Neck: No JVD present.  Cardiovascular: Normal rate and regular rhythm.   Respiratory: No respiratory distress. She has no wheezes. She has no rales.  GI: She exhibits no distension. There is no tenderness.  Musculoskeletal: She exhibits tenderness. She exhibits no edema.    Assessment/Plan: Problem #1 acute kidney injury superimposed on chronic. Most likely prerenal syndrome/ATN/natural progression of disease. Presently patient is a poor appetite but no nausea or vomiting. Problem #2 chronic renal  failure Her creatinine was 1.98 on 09/04/15                                 2.77 on 01/16/16                                3.67 on 05/25/16 with EGFR of 14 mL/m                                4.25 on 10/27/16 Hence patient seems to be in stage V chronic renal failure. Etiology could be secondary to diabetes/hypertension/recurrent AK I/obesity related glomerulopathy. Presently patient seems to be asymptomatic except for some appetite. She had ultrasound done which showed right kidney to be 11.9 and left kidney 10.9. Problem #3 proteinuria: Seems to be nephrotic range. Most likely from diabetes however other etiology cannot be ruled out. Problem #4 diabetes: Poorly controlled Problem # 5 hypertension: Her blood pressure is reasonably controlled Problem #6 sleep apnea: She is on C Pap Problem #7 morbid obesity Problem #8 Bipolar disorder/depression Plan: 1] We'll check ANA, complement, hepatitis B surface antigen, hepatitis C antibody 2] will do 24-hour urine for protein and immunoelectrophoresis 3] will start patient on normal saline at 75 mL per hour and 4] will use Lasix 80 mg IV twice a day 5] will check a renal panel in the morning.  Virgil Slinger S 11/15/2016, 9:33 AM

## 2016-11-15 NOTE — Progress Notes (Signed)
Pt BP last two readings have been lower than baseline. Spoke with MD, ordered a 250 ml bolus, EKG and troponin levels. Will continue to monitor.

## 2016-11-15 NOTE — Progress Notes (Signed)
Pt had a fall while going to the bathroom on Martha [name], NT2. MD notified, as well as other personnel. Vitals stable, BP a little low. Flow sheet to be filled out. Will continue to monitor.

## 2016-11-15 NOTE — Progress Notes (Signed)
Patient has Home CPAP.

## 2016-11-15 NOTE — Progress Notes (Signed)
Verbal order from MD to hold evening dose of Lasix due to BP being so low. Currently giving another 250 bolus, will continue to monitor.

## 2016-11-15 NOTE — H&P (Addendum)
History and Physical    Martha [name] EPP:[id number] DOB: 08-29-1965 DOA: 11/14/2016  PCP: Alonza Bogus, MD  Patient coming from: Home  I have personally briefly reviewed patient's old medical records in [place]  Chief Complaint: Headache  HPI: Martha [name] is a 51 y.o. female with medical history significant of CKD stage 5, COPD, HTN, DM, OSA.  Patient presents to the ED with c/o 2 day history of headache.  Headache associated with extremity weakness, numbness, and difficulty with gait.  Headache is similar to prior headaches but worse.  8/10 severity, posterior, constant head pain.  Has tried nothing for symptoms thus far.  Difficulty with gait has been ongoing for past 1 week.  Similar but "worse" to gait difficulty with other headaches.   ED Course: Most concerning is that her renal function continues to deteriorate over the past 2 months.  GFR is now 6 with a creat of 6.7 down from GFR of 11 on previous labs over past several months.     Review of Systems: As per HPI otherwise 10 point review of systems negative.   Past Medical History:  Diagnosis Date  . Acid reflux   . Anemia   . Arthritis   . Bipolar 1 disorder ([place])   . Cervical radiculopathy   . COPD (chronic obstructive pulmonary disease) ([place]) 2014   bronchitis  . Degenerative disc disease, thoracic   . Depression   . Diabetes mellitus   . Hypertension   . Noncompliance with medication regimen   . Obesity (BMI 30-39.9)   . OSA (obstructive sleep apnea)    cpap  . Panic attack   . RLS (restless legs syndrome)     Past Surgical History:  Procedure Laterality Date  . BIOPSY N/A 05/17/2013   Procedure: BIOPSY;  Surgeon: [name], MD;  Location: [facility];  Service: Endoscopy;  Laterality: N/A;  . CHOLECYSTECTOMY N/A 04/27/2014   Procedure: LAPAROSCOPIC CHOLECYSTECTOMY;  Surgeon: [name], MD;  Location: [facility];  Service: General;  Laterality: N/A;  . COLONOSCOPY  06/06/2010   SAY:TKZSWF bleeding secondary to internal hemorrhoids but incomplete evaluation secondary to poor right colon prep/small rectal and sigmoid colon polyps (hyperplastic). PROPOFOL  . COLONOSCOPY  May 2013   Dr. Gilliam/[facility]: 5 mm a descending colon polyp, hyperplastic. Adequate bowel prep.  . ENDOMETRIAL ABLATION    . ESOPHAGOGASTRODUODENOSCOPY  06/06/2010   UXN:ATFTDDU erythema and edema of body of stomach, with sessile polypoid lesions. bx benign. no h.pylori  . ESOPHAGOGASTRODUODENOSCOPY (EGD) WITH PROPOFOL N/A 05/17/2013   Dr. Oneida Alar: normal esophagus, moderate nodular gastritis, negative path, empiric Savary dilation  . FLEXIBLE SIGMOIDOSCOPY N/A 05/17/2013   Dr. Oneida Alar: moderate sized internal hemorrhoids  . HEMORRHOID BANDING N/A 05/17/2013   Procedure: HEMORRHOID BANDING;  Surgeon: [name], MD;  Location: [facility];  Service: Endoscopy;  Laterality: N/A;  2 bands placed  . INCISION AND DRAINAGE ABSCESS Left 10/11/2013   Procedure: INCISION AND DRAINAGE AND DEBRIDEMENT LEFT BREAST  ABSCESS;  Surgeon: [name], MD;  Location: [facility];  Service: General;  Laterality: Left;  . IRRIGATION AND DEBRIDEMENT ABSCESS Right 06/01/2013   Procedure: INCISION AND DRAINAGE AND DEBRIDEMENT ABSCESS RIGHT BREAST;  Surgeon: [name], MD;  Location: [facility];  Service: General;  Laterality: Right;  . SAVORY DILATION N/A 05/17/2013   Procedure: SAVORY DILATION;  Surgeon: [name], MD;  Location: [facility];  Service: Endoscopy;  Laterality: N/A;  14/15/16  . TUBAL  LIGATION       reports that she quit smoking about 9 years ago. Her smoking use included Cigarettes. She has a 37.50 pack-year smoking history. She has quit using smokeless tobacco. She reports that she does not drink alcohol or use drugs.  Allergies  Allergen Reactions  . Codeine Nausea And Vomiting    Family History  Problem Relation Age of Onset  . Adopted: Yes  . Family history unknown: Yes     Prior to  Admission medications   Medication Sig Start Date End Date Taking? Authorizing Provider  albuterol (PROVENTIL HFA;VENTOLIN HFA) 108 (90 Base) MCG/ACT inhaler Inhale 2 puffs into the lungs every 4 (four) hours as needed for wheezing or shortness of breath. 07/23/16  Yes Francine Graven, DO  albuterol (PROVENTIL) (2.5 MG/3ML) 0.083% nebulizer solution Inhale 3 mLs (2.5 mg total) into the lungs 3 (three) times daily. 10/29/16  Yes Sinda Du, MD  ALPRAZolam Duanne Moron) 1 MG tablet Take 1 mg by mouth 4 (four) times daily.     Yes Historical Provider, MD  atorvastatin (LIPITOR) 10 MG tablet Take 10 mg by mouth daily. Take along with 20 mg tablet to equal 30 mg daily.   Yes Historical Provider, MD  atorvastatin (LIPITOR) 20 MG tablet Take 20 mg by mouth daily. Take along with 10 mg tablet to equal 30 mg daily.   Yes Historical Provider, MD  baclofen (LIORESAL) 20 MG tablet Take 20 mg by mouth 3 (three) times daily as needed for muscle spasms.   Yes Historical Provider, MD  carvedilol (COREG) 12.5 MG tablet Take 1 tablet (12.5 mg total) by mouth 2 (two) times daily with a meal. 05/26/16  Yes Cherene Altes, MD  colchicine 0.6 MG tablet Take 1 tablet (0.6 mg total) by mouth daily. 10/29/16  Yes Sinda Du, MD  fluticasone (FLONASE) 50 MCG/ACT nasal spray Place 2 sprays into both nostrils at bedtime.   Yes Historical Provider, MD  hydrALAZINE (APRESOLINE) 25 MG tablet Take 1 tablet (25 mg total) by mouth every 8 (eight) hours. 10/29/16  Yes Sinda Du, MD  HYDROcodone-acetaminophen (NORCO/VICODIN) 5-325 MG tablet Take 1 tablet by mouth every 4 (four) hours as needed for moderate pain.   Yes Historical Provider, MD  lisinopril-hydrochlorothiazide (PRINZIDE,ZESTORETIC) 20-12.5 MG tablet Take 1 tablet by mouth daily.   Yes Historical Provider, MD  metoCLOPramide (REGLAN) 10 MG tablet Take 1 tablet (10 mg total) by mouth every 6 (six) hours as needed for nausea (or headache). 0/2/58  Yes Delora Fuel, MD    PARoxetine (PAXIL) 20 MG tablet Take 1 daily. This is to prevent panic attacks Patient taking differently: Take 20 mg by mouth daily. Take 1 daily. This is to prevent panic attacks 10/28/16  Yes Sinda Du, MD  polyethylene glycol [facility] / GLYCOLAX) packet Take 17 g by mouth daily. 10/30/16  Yes Sinda Du, MD  rOPINIRole (REQUIP) 1 MG tablet Take 1 mg by mouth at bedtime.    Yes Historical Provider, MD  furosemide (LASIX) 20 MG tablet Take 20 mg by mouth daily as needed for edema.    Historical Provider, MD  loperamide (IMODIUM) 2 MG capsule Take 1 capsule (2 mg total) by mouth 4 (four) times daily as needed for diarrhea or loose stools. 10/23/16   Evalee Jefferson, PA-C    Physical Exam: Vitals:   11/15/16 0130 11/15/16 0200 11/15/16 0212 11/15/16 0230  BP: (!) 167/94 (!) 174/108 (!) 174/108 (!) 187/104  Pulse: 79 96 89 95  Resp:  20   Temp:      TempSrc:      SpO2: 98% 94% 95% 97%  Weight:      Height:        Constitutional: NAD, calm, comfortable Eyes: PERRL, lids and conjunctivae normal ENMT: Mucous membranes are moist. Posterior pharynx clear of any exudate or lesions.Normal dentition.  Neck: normal, supple, no masses, no thyromegaly Respiratory: clear to auscultation bilaterally, no wheezing, no crackles. Normal respiratory effort. No accessory muscle use.  Cardiovascular: Regular rate and rhythm, no murmurs / rubs / gallops. No extremity edema. 2+ pedal pulses. No carotid bruits.  Abdomen: no tenderness, no masses palpated. No hepatosplenomegaly. Bowel sounds positive.  Musculoskeletal: no clubbing / cyanosis. No joint deformity upper and lower extremities. Good ROM, no contractures. Normal muscle tone.  Skin: no rashes, lesions, ulcers. No induration Neurologic: Unsteady gait, wide based and requiring assistance to ambulate Psychiatric: Normal judgment and insight. Alert and oriented x 3. Normal mood.    Labs on Admission: I have personally reviewed following labs and  imaging studies  CBC:  Recent Labs Lab 11/15/16 0032  WBC 5.2  NEUTROABS 2.9  HGB 12.1  HCT 37.5  MCV 87.0  PLT 478*   Basic Metabolic Panel:  Recent Labs Lab 11/15/16 0032  NA 142  K 5.0  CL 110  CO2 22  GLUCOSE 103*  BUN 76*  CREATININE 6.78*  CALCIUM 9.3   GFR: Estimated Creatinine Clearance: 13.6 mL/min (A) (by C-G formula based on SCr of 6.78 mg/dL (H)). Liver Function Tests:  Recent Labs Lab 11/15/16 0032  AST 14*  ALT 21  ALKPHOS 72  BILITOT 0.5  PROT 7.0  ALBUMIN 3.9   No results for input(s): LIPASE, AMYLASE in the last 168 hours. No results for input(s): AMMONIA in the last 168 hours. Coagulation Profile: No results for input(s): INR, PROTIME in the last 168 hours. Cardiac Enzymes: No results for input(s): CKTOTAL, CKMB, CKMBINDEX, TROPONINI in the last 168 hours. BNP (last 3 results) No results for input(s): PROBNP in the last 8760 hours. HbA1C: No results for input(s): HGBA1C in the last 72 hours. CBG: No results for input(s): GLUCAP in the last 168 hours. Lipid Profile: No results for input(s): CHOL, HDL, LDLCALC, TRIG, CHOLHDL, LDLDIRECT in the last 72 hours. Thyroid Function Tests: No results for input(s): TSH, T4TOTAL, FREET4, T3FREE, THYROIDAB in the last 72 hours. Anemia Panel: No results for input(s): VITAMINB12, FOLATE, FERRITIN, TIBC, IRON, RETICCTPCT in the last 72 hours. Urine analysis:    Component Value Date/Time   COLORURINE YELLOW 10/27/2016 0740   APPEARANCEUR HAZY (A) 10/27/2016 0740   LABSPEC 1.011 10/27/2016 0740   PHURINE 6.0 10/27/2016 0740   GLUCOSEU 50 (A) 10/27/2016 0740   HGBUR NEGATIVE 10/27/2016 0740   BILIRUBINUR NEGATIVE 10/27/2016 0740   KETONESUR NEGATIVE 10/27/2016 0740   PROTEINUR >=300 (A) 10/27/2016 0740   UROBILINOGEN 0.2 03/17/2015 2236   NITRITE NEGATIVE 10/27/2016 0740   LEUKOCYTESUR NEGATIVE 10/27/2016 0740    Radiological Exams on Admission: No results found.  EKG: Independently  reviewed.  Assessment/Plan Principal Problem:   AKI (acute kidney injury) ([place]) Active Problems:   Essential hypertension   Sleep apnea   Diabetes mellitus, type 2 ([place])   CKD stage 5 secondary to hypertension ([place])    1. AKI on CKD stage 5 - probably further progression of CKD stage 5 1. Will put in for nephrology consult for her to be evaluated later today given worsening renal function 2. Will leave  home meds alone for the moment pending nephrology evaluation 2. HTN - 1. will leave home meds alone for the moment 2. PRN IV hydralazine if needed for SBP > 180 3. DM2 - 1. Appears diet controlled 2. CBG checks AC/HS 4. OSA - continue CPAP QHS 5. Headache - likely tension type 1. Improved somewhat with BP improvement, now 160 SBP 2. Will get CT head to ensure no hemorrhage given neuro complaints 3. PT/OT and up with assist given fall risk 4. If gait abnormality persists, consider MRI brain to r/o stroke, although this has been going on for 1 week now so I would expect a large volume stroke to show up on CT head.  DVT prophylaxis: Heparin [place] Code Status: Full Family Communication: Husband at bedside Disposition Plan: Home after admit Consults called: Nephrology consult placed in computer Admission status: Place in [place], [facility] Pager [phone]  If 7AM-7PM, please contact day team taking care of patient [email or web address] Password [email or web address]  11/15/2016, 3:15 AM

## 2016-11-16 ENCOUNTER — Inpatient Hospital Stay (HOSPITAL_COMMUNITY): Payer: Medicaid Other

## 2016-11-16 LAB — C4 COMPLEMENT: COMPLEMENT C4, BODY FLUID: 21 mg/dL (ref 14–44)

## 2016-11-16 LAB — HEPATITIS C ANTIBODY: HCV Ab: <0.1 s/co ratio (ref 0.0–0.9)

## 2016-11-16 LAB — RENAL FUNCTION PANEL
Albumin: 3.4 g/dL — ABNORMAL LOW (ref 3.5–5.0)
Anion gap: 8 (ref 5–15)
BUN: 73 mg/dL — AB (ref 6–20)
CO2: 18 mmol/L — AB (ref 22–32)
Calcium: 8.9 mg/dL (ref 8.9–10.3)
Chloride: 117 mmol/L — ABNORMAL HIGH (ref 101–111)
Creatinine, Ser: 6.22 mg/dL — ABNORMAL HIGH (ref 0.44–1.00)
GFR calc Af Amer: 8 mL/min — ABNORMAL LOW (ref >60)
GFR, EST NON AFRICAN AMERICAN: 7 mL/min — AB (ref >60)
GLUCOSE: 89 mg/dL (ref 65–99)
PHOSPHORUS: 5.6 mg/dL — AB (ref 2.5–4.6)
POTASSIUM: 4.5 mmol/L (ref 3.5–5.1)
SODIUM: 143 mmol/L (ref 135–145)

## 2016-11-16 LAB — GLUCOSE, CAPILLARY
GLUCOSE-CAPILLARY: 91 mg/dL (ref 65–99)
GLUCOSE-CAPILLARY: 79 mg/dL (ref 65–99)
GLUCOSE-CAPILLARY: 134 mg/dL — AB (ref 65–99)
Glucose-Capillary: 90 mg/dL (ref 65–99)
Glucose-Capillary: 123 mg/dL — ABNORMAL HIGH (ref 65–99)

## 2016-11-16 LAB — HEPATITIS B SURFACE ANTIGEN: HEP B S AG: NEGATIVE

## 2016-11-16 LAB — RPR: RPR Ser Ql: NONREACTIVE

## 2016-11-16 LAB — COMPLEMENT, TOTAL: Compl, Total (CH50): >60 U/mL — ABNORMAL HIGH (ref 42–60)

## 2016-11-16 LAB — C3 COMPLEMENT: C3 COMPLEMENT: 90 mg/dL (ref 82–167)

## 2016-11-16 LAB — TROPONIN I

## 2016-11-16 MED ORDER — FUROSEMIDE 10 MG/ML IJ SOLN
120.0000 mg | Freq: Two times a day (BID) | INTRAVENOUS | Status: DC
  Administered 2016-11-16 – 2016-11-17 (×3): 120 mg via INTRAVENOUS
  Filled 2016-11-16 (×4): qty 12

## 2016-11-16 NOTE — Progress Notes (Signed)
Subjective: Interval History: has no complaint of poor appetite but no nausea or vomiting. Patient also complains of hand pain and nightmares. She denies any difficulty breathing..  Objective: Vital signs in last 24 hours: Temp:  [97.5 F (36.4 C)-98.1 F (36.7 C)] 97.6 F (36.4 C) (04/22 0621) Pulse Rate:  [75-89] 87 (04/22 0621) Resp:  [18] 18 (04/22 0621) BP: (84-158)/(40-94) 158/94 (04/22 0621) SpO2:  [90 %-100 %] 98 % (04/22 0832) Weight change:   Intake/Output from previous day: 04/21 0701 - 04/22 0700 In: 1323.8 [I.V.:1323.8] Out: 125 [Urine:125] Intake/Output this shift: Total I/O In: -  Out: 150 [Urine:150]  General appearance: alert, cooperative and mild distress Resp: diminished breath sounds bilaterally Cardio: regular rate and rhythm Extremities: No edema  Lab Results:  Recent Labs  11/15/16 0032  WBC 5.2  HGB 12.1  HCT 37.5  PLT 145*   BMET:  Recent Labs  11/15/16 0032 11/16/16 0310  NA 142 143  K 5.0 4.5  CL 110 117*  CO2 22 18*  GLUCOSE 103* 89  BUN 76* 73*  CREATININE 6.78* 6.22*  CALCIUM 9.3 8.9   No results for input(s): PTH in the last 72 hours. Iron Studies: No results for input(s): IRON, TIBC, TRANSFERRIN, FERRITIN in the last 72 hours.  Studies/Results: Ct Head Wo Contrast  Result Date: 11/15/2016 CLINICAL DATA:  Two day history of headache. Patient woke from sleep with bilateral arm and leg weakness. EXAM: CT HEAD WITHOUT CONTRAST TECHNIQUE: Contiguous axial images were obtained from the base of the skull through the vertex without intravenous contrast. COMPARISON:  10/27/2016 FINDINGS: Brain: No evidence of acute infarction, hemorrhage, hydrocephalus, extra-axial collection or mass lesion/mass effect. Vascular: No hyperdense vessel or unexpected calcification. Skull: Normal. Negative for fracture or focal lesion. Sinuses/Orbits: Probable old medial wall fracture of the right orbit. Paranasal sinuses and mastoid air cells are clear.  Other: No change since prior study. IMPRESSION: No acute intracranial abnormalities. Electronically Signed   By: Lucienne Capers M.D.   On: 11/15/2016 03:40    I have reviewed the patient's current medications.  Assessment/Plan: Problem #1 acute kidney injury superimposed on chronic: Presently her BUN and creatinine is slightly improving. Her urine output however is low. Patient denies any nausea or vomiting. Her appetite however is poor. Problem #2 low CO2 possibly metabolic Problem #3 hypertension: Her blood pressure is reasonably controlled Problem #4 history of sleep apnea: Patient is on CPAP Problem #5 history of diabetes Problem #6 history of depression Problem #7 history of degenerative joint disease Problem #8 metabolic bone disease: Calcium is range but phosphorus is slightly high. Plan: We'll increase her IV fluid to 100 mL per hour 2] will increase Lasix to one 20 mg IV twice a day 3] will start patient on sodium bicarbonate 650 mg by mouth twice a day 4] will check her renal panel in the morning. 5] will check vitamin D level and intact PTH in the morning.   LOS: 1 day   Jahlani Lorentz S 11/16/2016,10:56 AM

## 2016-11-16 NOTE — Progress Notes (Signed)
Subjective: She says she feels a little better. She has no new complaints. Breathing is okay. She had echocardiogram about 3 weeks ago so I canceled echocardiogram that was ordered here. She is undergoing 24-hour urine. Her renal function has improved slightly no chest pain. No other new complaints.  Objective: Vital signs in last 24 hours: Temp:  [97.5 F (36.4 C)-98.1 F (36.7 C)] 97.6 F (36.4 C) (04/22 0621) Pulse Rate:  [75-89] 87 (04/22 0621) Resp:  [18] 18 (04/22 0621) BP: (84-158)/(40-94) 158/94 (04/22 0621) SpO2:  [90 %-100 %] 98 % (04/22 0832) Weight change:  Last BM Date: 11/14/16  Intake/Output from previous day: 04/21 0701 - 04/22 0700 In: 1323.8 [I.V.:1323.8] Out: 125 [Urine:125]  PHYSICAL EXAM General appearance: alert, cooperative and mild distress Resp: clear to auscultation bilaterally Cardio: regular rate and rhythm, S1, S2 normal, no murmur, click, rub or gallop GI: soft, non-tender; bowel sounds normal; no masses,  no organomegaly Extremities: extremities normal, atraumatic, no cyanosis or edema Skin warm and dry.  Lab Results:  Results for orders placed or performed during the hospital encounter of 11/14/16 (from the past 48 hour(s))  Ethanol     Status: None   Collection Time: 11/15/16 12:32 AM  Result Value Ref Range   Alcohol, Ethyl (B) <5 <5 mg/dL    Comment:        LOWEST DETECTABLE LIMIT FOR SERUM ALCOHOL IS 5 mg/dL FOR MEDICAL PURPOSES ONLY   CBC     Status: Abnormal   Collection Time: 11/15/16 12:32 AM  Result Value Ref Range   WBC 5.2 4.0 - 10.5 K/uL   RBC 4.31 3.87 - 5.11 MIL/uL   Hemoglobin 12.1 12.0 - 15.0 g/dL   HCT 37.5 36.0 - 46.0 %   MCV 87.0 78.0 - 100.0 fL   MCH 28.1 26.0 - 34.0 pg   MCHC 32.3 30.0 - 36.0 g/dL   RDW 15.3 11.5 - 15.5 %   Platelets 145 (L) 150 - 400 K/uL  Differential     Status: None   Collection Time: 11/15/16 12:32 AM  Result Value Ref Range   Neutrophils Relative % 55 %   Neutro Abs 2.9 1.7 - 7.7 K/uL    Lymphocytes Relative 34 %   Lymphs Abs 1.7 0.7 - 4.0 K/uL   Monocytes Relative 7 %   Monocytes Absolute 0.3 0.1 - 1.0 K/uL   Eosinophils Relative 3 %   Eosinophils Absolute 0.1 0.0 - 0.7 K/uL   Basophils Relative 1 %   Basophils Absolute 0.0 0.0 - 0.1 K/uL  Comprehensive metabolic panel     Status: Abnormal   Collection Time: 11/15/16 12:32 AM  Result Value Ref Range   Sodium 142 135 - 145 mmol/L   Potassium 5.0 3.5 - 5.1 mmol/L   Chloride 110 101 - 111 mmol/L   CO2 22 22 - 32 mmol/L   Glucose, Bld 103 (H) 65 - 99 mg/dL   BUN 76 (H) 6 - 20 mg/dL   Creatinine, Ser 6.78 (H) 0.44 - 1.00 mg/dL   Calcium 9.3 8.9 - 10.3 mg/dL   Total Protein 7.0 6.5 - 8.1 g/dL   Albumin 3.9 3.5 - 5.0 g/dL   AST 14 (L) 15 - 41 U/L   ALT 21 14 - 54 U/L   Alkaline Phosphatase 72 38 - 126 U/L   Total Bilirubin 0.5 0.3 - 1.2 mg/dL   GFR calc non Af Amer 6 (L) >60 mL/min   GFR calc Af Amer 8 (L) >  60 mL/min    Comment: (NOTE) The eGFR has been calculated using the CKD EPI equation. This calculation has not been validated in all clinical situations. eGFR's persistently <60 mL/min signify possible Chronic Kidney Disease.    Anion gap 10 5 - 15  Glucose, capillary     Status: Abnormal   Collection Time: 11/15/16  8:06 AM  Result Value Ref Range   Glucose-Capillary 142 (H) 65 - 99 mg/dL   Comment 1 Notify RN    Comment 2 Document in Chart   Hepatitis B surface antigen     Status: None   Collection Time: 11/15/16 10:22 AM  Result Value Ref Range   Hepatitis B Surface Ag Negative Negative    Comment: (NOTE) Performed At: [facility] [place], [place] [id number] Lindon Romp MD [id number]   Hepatitis C antibody     Status: None   Collection Time: 11/15/16 10:22 AM  Result Value Ref Range   HCV Ab <0.1 0.0 - 0.9 s/co ratio    Comment: (NOTE)                                  Negative:     < 0.8                             Indeterminate: 0.8 - 0.9                                   Positive:     > 0.9 The CDC recommends that a positive HCV antibody result be followed up with a HCV Nucleic Acid Amplification test (244010). Performed At: [facility] [place], [place] [id number] Lindon Romp MD [id number]   RPR     Status: None   Collection Time: 11/15/16 10:22 AM  Result Value Ref Range   RPR Ser Ql Non Reactive Non Reactive    Comment: (NOTE) Performed At: [facility] [address] [place], [place] [id number] Lindon Romp MD [id number]   Glucose, capillary     Status: Abnormal   Collection Time: 11/15/16 11:26 AM  Result Value Ref Range   Glucose-Capillary 149 (H) 65 - 99 mg/dL  Troponin I (q 6hr x 3)     Status: None   Collection Time: 11/15/16  3:49 PM  Result Value Ref Range   Troponin I <0.03 <0.03 ng/mL  Glucose, capillary     Status: Abnormal   Collection Time: 11/15/16  4:50 PM  Result Value Ref Range   Glucose-Capillary 115 (H) 65 - 99 mg/dL  Glucose, capillary     Status: None   Collection Time: 11/15/16  8:43 PM  Result Value Ref Range   Glucose-Capillary 97 65 - 99 mg/dL   Comment 1 Notify RN    Comment 2 Document in Chart   Troponin I (q 6hr x 3)     Status: None   Collection Time: 11/15/16  9:25 PM  Result Value Ref Range   Troponin I <0.03 <0.03 ng/mL  Troponin I (q 6hr x 3)     Status: None   Collection Time: 11/16/16  3:10 AM  Result Value Ref Range   Troponin I <0.03 <0.03 ng/mL  Renal function panel     Status: Abnormal   Collection  Time: 11/16/16  3:10 AM  Result Value Ref Range   Sodium 143 135 - 145 mmol/L   Potassium 4.5 3.5 - 5.1 mmol/L   Chloride 117 (H) 101 - 111 mmol/L   CO2 18 (L) 22 - 32 mmol/L   Glucose, Bld 89 65 - 99 mg/dL   BUN 73 (H) 6 - 20 mg/dL   Creatinine, Ser 6.22 (H) 0.44 - 1.00 mg/dL   Calcium 8.9 8.9 - 10.3 mg/dL   Phosphorus 5.6 (H) 2.5 - 4.6 mg/dL   Albumin 3.4 (L) 3.5 - 5.0 g/dL   GFR calc non Af Amer 7 (L) >60 mL/min   GFR calc Af Amer  8 (L) >60 mL/min    Comment: (NOTE) The eGFR has been calculated using the CKD EPI equation. This calculation has not been validated in all clinical situations. eGFR's persistently <60 mL/min signify possible Chronic Kidney Disease.    Anion gap 8 5 - 15  Glucose, capillary     Status: None   Collection Time: 11/16/16  9:07 AM  Result Value Ref Range   Glucose-Capillary 90 65 - 99 mg/dL   Comment 1 Notify RN    Comment 2 Document in Chart   Glucose, capillary     Status: None   Collection Time: 11/16/16  9:48 AM  Result Value Ref Range   Glucose-Capillary 91 65 - 99 mg/dL    ABGS No results for input(s): PHART, PO2ART, TCO2, HCO3 in the last 72 hours.  Invalid input(s): PCO2 CULTURES No results found for this or any previous visit (from the past 240 hour(s)). Studies/Results: Ct Head Wo Contrast  Result Date: 11/15/2016 CLINICAL DATA:  Two day history of headache. Patient woke from sleep with bilateral arm and leg weakness. EXAM: CT HEAD WITHOUT CONTRAST TECHNIQUE: Contiguous axial images were obtained from the base of the skull through the vertex without intravenous contrast. COMPARISON:  10/27/2016 FINDINGS: Brain: No evidence of acute infarction, hemorrhage, hydrocephalus, extra-axial collection or mass lesion/mass effect. Vascular: No hyperdense vessel or unexpected calcification. Skull: Normal. Negative for fracture or focal lesion. Sinuses/Orbits: Probable old medial wall fracture of the right orbit. Paranasal sinuses and mastoid air cells are clear. Other: No change since prior study. IMPRESSION: No acute intracranial abnormalities. Electronically Signed   By: Lucienne Capers M.D.   On: 11/15/2016 03:40    Medications:  Prior to Admission:  Prescriptions Prior to Admission  Medication Sig Dispense Refill Last Dose  . albuterol (PROVENTIL HFA;VENTOLIN HFA) 108 (90 Base) MCG/ACT inhaler Inhale 2 puffs into the lungs every 4 (four) hours as needed for wheezing or shortness of  breath. 1 Inhaler 0 11/14/2016 at Unknown time  . albuterol (PROVENTIL) (2.5 MG/3ML) 0.083% nebulizer solution Inhale 3 mLs (2.5 mg total) into the lungs 3 (three) times daily. 75 mL 12 11/14/2016 at Unknown time  . ALPRAZolam (XANAX) 1 MG tablet Take 1 mg by mouth 4 (four) times daily.     11/14/2016 at Unknown time  . atorvastatin (LIPITOR) 10 MG tablet Take 10 mg by mouth daily. Take along with 20 mg tablet to equal 30 mg daily.   11/14/2016 at Unknown time  . atorvastatin (LIPITOR) 20 MG tablet Take 20 mg by mouth daily. Take along with 10 mg tablet to equal 30 mg daily.   11/14/2016 at Unknown time  . baclofen (LIORESAL) 20 MG tablet Take 20 mg by mouth 3 (three) times daily as needed for muscle spasms.   11/14/2016 at Unknown time  . carvedilol (  COREG) 12.5 MG tablet Take 1 tablet (12.5 mg total) by mouth 2 (two) times daily with a meal. 60 tablet 0 11/14/2016 at [place]  . colchicine 0.6 MG tablet Take 1 tablet (0.6 mg total) by mouth daily. 60 tablet 1 11/14/2016 at Unknown time  . fluticasone (FLONASE) 50 MCG/ACT nasal spray Place 2 sprays into both nostrils at bedtime.   11/13/2016 at Unknown time  . hydrALAZINE (APRESOLINE) 25 MG tablet Take 1 tablet (25 mg total) by mouth every 8 (eight) hours. 90 tablet 12 11/14/2016 at Unknown time  . HYDROcodone-acetaminophen (NORCO/VICODIN) 5-325 MG tablet Take 1 tablet by mouth every 4 (four) hours as needed for moderate pain.   11/14/2016 at Unknown time  . lisinopril-hydrochlorothiazide (PRINZIDE,ZESTORETIC) 20-12.5 MG tablet Take 1 tablet by mouth daily.   11/14/2016 at Unknown time  . metoCLOPramide (REGLAN) 10 MG tablet Take 1 tablet (10 mg total) by mouth every 6 (six) hours as needed for nausea (or headache). 30 tablet 0 11/14/2016 at Unknown time  . PARoxetine (PAXIL) 20 MG tablet Take 1 daily. This is to prevent panic attacks (Patient taking differently: Take 20 mg by mouth daily. Take 1 daily. This is to prevent panic attacks) 30 tablet 12 11/14/2016 at  Unknown time  . polyethylene glycol (MIRALAX / GLYCOLAX) packet Take 17 g by mouth daily. 14 each 0 11/14/2016 at Unknown time  . rOPINIRole (REQUIP) 1 MG tablet Take 1 mg by mouth at bedtime.    11/13/2016 at Unknown time  . furosemide (LASIX) 20 MG tablet Take 20 mg by mouth daily as needed for edema.   unknown  . loperamide (IMODIUM) 2 MG capsule Take 1 capsule (2 mg total) by mouth 4 (four) times daily as needed for diarrhea or loose stools. 12 capsule 0 10/26/2016 at Unknown time   Scheduled: . albuterol  2.5 mg Inhalation TID  . ALPRAZolam  1 mg Oral QID  . atorvastatin  30 mg Oral Daily  . carvedilol  12.5 mg Oral BID WC  . colchicine  0.6 mg Oral Q48H  . fluticasone  2 spray Each Nare QHS  . furosemide  80 mg Intravenous BID  . heparin  5,000 Units Subcutaneous Q8H  . hydrALAZINE  25 mg Oral Q8H  . hydrochlorothiazide  12.5 mg Oral Daily  . lisinopril  20 mg Oral Daily  . PARoxetine  20 mg Oral Daily  . polyethylene glycol  17 g Oral Daily  . rOPINIRole  1 mg Oral QHS   Continuous: . sodium chloride 75 mL/hr at 11/15/16 2023   ONG:[id number], baclofen, hydrALAZINE, HYDROcodone-acetaminophen, loperamide, metoCLOPramide  Assesment: She was admitted with acute on chronic renal failure. She has stage V chronic kidney disease. She is being prepared for dialysis. She has hypertension and that is pretty well controlled. Blood sugar is pretty well controlled. She has obstructive sleep apnea. Principal Problem:   AKI (acute kidney injury) ([place]) Active Problems:   Essential hypertension   Sleep apnea   Diabetes mellitus, type 2 ([place])   CKD stage 5 secondary to hypertension ([place])    Plan: Continue current treatments    LOS: 1 day   Adar Rase L 11/16/2016, 9:51 AM

## 2016-11-17 LAB — GLUCOSE, CAPILLARY
GLUCOSE-CAPILLARY: 98 mg/dL (ref 65–99)
GLUCOSE-CAPILLARY: 129 mg/dL — AB (ref 65–99)
Glucose-Capillary: 102 mg/dL — ABNORMAL HIGH (ref 65–99)
Glucose-Capillary: 126 mg/dL — ABNORMAL HIGH (ref 65–99)

## 2016-11-17 LAB — RENAL FUNCTION PANEL
Albumin: 3.3 g/dL — ABNORMAL LOW (ref 3.5–5.0)
Anion gap: 9 (ref 5–15)
BUN: 63 mg/dL — AB (ref 6–20)
CO2: 20 mmol/L — AB (ref 22–32)
Calcium: 8.9 mg/dL (ref 8.9–10.3)
Chloride: 113 mmol/L — ABNORMAL HIGH (ref 101–111)
Creatinine, Ser: 5.19 mg/dL — ABNORMAL HIGH (ref 0.44–1.00)
GFR calc Af Amer: 10 mL/min — ABNORMAL LOW (ref >60)
GFR, EST NON AFRICAN AMERICAN: 9 mL/min — AB (ref >60)
Glucose, Bld: 97 mg/dL (ref 65–99)
POTASSIUM: 4.3 mmol/L (ref 3.5–5.1)
Phosphorus: 4.7 mg/dL — ABNORMAL HIGH (ref 2.5–4.6)
Sodium: 142 mmol/L (ref 135–145)

## 2016-11-17 LAB — RAPID URINE DRUG SCREEN, HOSP PERFORMED
Amphetamines: NOT DETECTED
BARBITURATES: NOT DETECTED
Benzodiazepines: POSITIVE — AB
Cocaine: NOT DETECTED
Opiates: POSITIVE — AB
Tetrahydrocannabinol: NOT DETECTED

## 2016-11-17 LAB — URINALYSIS, ROUTINE W REFLEX MICROSCOPIC
BILIRUBIN URINE: NEGATIVE
GLUCOSE, UA: NEGATIVE mg/dL
Hgb urine dipstick: NEGATIVE
KETONES UR: NEGATIVE mg/dL
LEUKOCYTES UA: NEGATIVE
Nitrite: NEGATIVE
PH: 5 (ref 5.0–8.0)
Protein, ur: 100 mg/dL — AB
Specific Gravity, Urine: 1.008 (ref 1.005–1.030)

## 2016-11-17 LAB — PROTEIN, URINE, 24 HOUR
COLLECTION INTERVAL-UPROT: 24 h
PROTEIN, URINE: 165 mg/dL
Protein, 24H Urine: 413 mg/d — ABNORMAL HIGH (ref 50–100)
URINE TOTAL VOLUME-UPROT: 250 mL

## 2016-11-17 LAB — CBC
HEMATOCRIT: 32.8 % — AB (ref 36.0–46.0)
Hemoglobin: 10.7 g/dL — ABNORMAL LOW (ref 12.0–15.0)
MCH: 28.4 pg (ref 26.0–34.0)
MCHC: 32.6 g/dL (ref 30.0–36.0)
MCV: 87 fL (ref 78.0–100.0)
PLATELETS: 142 10*3/uL — AB (ref 150–400)
RBC: 3.77 MIL/uL — ABNORMAL LOW (ref 3.87–5.11)
RDW: 15.4 % (ref 11.5–15.5)
WBC: 4.3 10*3/uL (ref 4.0–10.5)

## 2016-11-17 LAB — ANTINUCLEAR ANTIBODIES, IFA: ANA Ab, IFA: NEGATIVE

## 2016-11-17 LAB — PTH, INTACT AND CALCIUM
CALCIUM TOTAL (PTH): 8.7 mg/dL (ref 8.7–10.2)
PTH: 61 pg/mL (ref 15–65)

## 2016-11-17 LAB — VITAMIN D 25 HYDROXY (VIT D DEFICIENCY, FRACTURES): Vit D, 25-Hydroxy: 7 ng/mL — ABNORMAL LOW (ref 30.0–100.0)

## 2016-11-17 NOTE — Care Management Note (Signed)
Case Management Note  Patient Details  Name: Martha [name] MRN: [id number] Date of Birth: 1966/02/06    Subjective: Adm with AKI. Pt is from home, lives with family. She has PCP, transportation to appointments and insurance with drug coverage. PT recommends HH PT and WC. Medicaid will not pay for [facility] PT. Patient has RW at home.   Action/Plan: Romualdo Bolk of [facility] notified of need for WC. PT will f/u with patient about home exercises to gain strength.   Expected Discharge Date:  11/17/16               Expected Discharge Plan:  Home/Self Care  In-House Referral:     Discharge planning Services  CM Consult  Post Acute Care Choice:  Durable Medical Equipment Choice offered to:  Patient  DME Arranged:  Wheelchair manual DME Agency:  [place]:    [place]:     Status of Service:  Completed, signed off  If discussed at [place] of Stay Meetings, dates discussed:    Additional Comments:  Aundre Hietala, Chauncey Reading, RN 11/17/2016, 11:03 AM

## 2016-11-17 NOTE — Progress Notes (Signed)
Subjective: She says she's having to urinate too much and I told her I think it's because she's on some diuretics and she is on IV fluids. She also says that she's had some diarrhea but she requested a dose of MiraLAX yesterday so I will discontinue that. She's having trouble getting up and moving around. She still has soreness of her feet and legs.  Objective: Vital signs in last 24 hours: Temp:  [97.5 F (36.4 C)-98.7 F (37.1 C)] 98.6 F (37 C) (04/23 0557) Pulse Rate:  [79-88] 88 (04/23 0557) Resp:  [16-18] 16 (04/23 0557) BP: (107-157)/(55-90) 157/81 (04/23 0557) SpO2:  [91 %-98 %] 91 % (04/23 0722) Weight change:  Last BM Date: 11/16/16  Intake/Output from previous day: 04/22 0701 - 04/23 0700 In: 2522.9 [P.O.:360; I.V.:2162.9] Out: 1530 [Urine:1530]  PHYSICAL EXAM General appearance: alert, cooperative and no distress Resp: clear to auscultation bilaterally Cardio: regular rate and rhythm, S1, S2 normal, no murmur, click, rub or gallop GI: soft, non-tender; bowel sounds normal; no masses,  no organomegaly Extremities: extremities normal, atraumatic, no cyanosis or edema Skin warm and dry  Lab Results:  Results for orders placed or performed during the hospital encounter of 11/14/16 (from the past 48 hour(s))  Hepatitis B surface antigen     Status: None   Collection Time: 11/15/16 10:22 AM  Result Value Ref Range   Hepatitis B Surface Ag Negative Negative    Comment: (NOTE) Performed At: [facility] [place], [place] [id number] Lindon Romp MD [id number]   Hepatitis C antibody     Status: None   Collection Time: 11/15/16 10:22 AM  Result Value Ref Range   HCV Ab <0.1 0.0 - 0.9 s/co ratio    Comment: (NOTE)                                  Negative:     < 0.8                             Indeterminate: 0.8 - 0.9                                  Positive:     > 0.9 The CDC recommends that a positive HCV antibody result be followed up  with a HCV Nucleic Acid Amplification test (010272). Performed At: [facility] [place], [place] [id number] Lindon Romp MD [id number]   C3 complement     Status: None   Collection Time: 11/15/16 10:22 AM  Result Value Ref Range   C3 Complement 90 82 - 167 mg/dL    Comment: (NOTE) Performed At: [facility] [place], [place] [id number] Lindon Romp MD [id number]   C4 complement     Status: None   Collection Time: 11/15/16 10:22 AM  Result Value Ref Range   Complement C4, Body Fluid 21 14 - 44 mg/dL    Comment: (NOTE) Performed At: [facility] [place], [place] [id number] Lindon Romp MD [id number]   Complement, total     Status: Abnormal   Collection Time: 11/15/16 10:22 AM  Result Value Ref Range   Compl, Total (CH50) >60 (H) 42 - 60 U/mL    Comment: (NOTE)               **  Effective Dec 15, 2016 the reference interval for**                 Complement, Total (CH50) will be changing to:                            Age                Female          Female                         1 - 30 days         Not Estab.     Not Estab.                   31 days -  6 months          >32            >20                  7 months - 17 years           >39            >39                            >17 years           >41            >41 Performed At: [facility] [place], [place] [id number] Lindon Romp MD [id number]   RPR     Status: None   Collection Time: 11/15/16 10:22 AM  Result Value Ref Range   RPR Ser Ql Non Reactive Non Reactive    Comment: (NOTE) Performed At: [facility] [place], [place] [id number] Lindon Romp MD [id number]   Glucose, capillary     Status: Abnormal   Collection Time: 11/15/16 11:26 AM  Result Value Ref Range   Glucose-Capillary 149 (H) 65 - 99 mg/dL  Troponin I (q 6hr x 3)     Status: None   Collection Time:  11/15/16  3:49 PM  Result Value Ref Range   Troponin I <0.03 <0.03 ng/mL  Glucose, capillary     Status: Abnormal   Collection Time: 11/15/16  4:50 PM  Result Value Ref Range   Glucose-Capillary 115 (H) 65 - 99 mg/dL  Glucose, capillary     Status: None   Collection Time: 11/15/16  8:43 PM  Result Value Ref Range   Glucose-Capillary 97 65 - 99 mg/dL   Comment 1 Notify RN    Comment 2 Document in Chart   Troponin I (q 6hr x 3)     Status: None   Collection Time: 11/15/16  9:25 PM  Result Value Ref Range   Troponin I <0.03 <0.03 ng/mL  Troponin I (q 6hr x 3)     Status: None   Collection Time: 11/16/16  3:10 AM  Result Value Ref Range   Troponin I <0.03 <0.03 ng/mL  Renal function panel     Status: Abnormal   Collection Time: 11/16/16  3:10 AM  Result Value Ref Range   Sodium 143 135 - 145 mmol/L   Potassium 4.5 3.5 - 5.1 mmol/L   Chloride 117 (H) 101 - 111 mmol/L   CO2 18 (L) 22 -  32 mmol/L   Glucose, Bld 89 65 - 99 mg/dL   BUN 73 (H) 6 - 20 mg/dL   Creatinine, Ser 6.22 (H) 0.44 - 1.00 mg/dL   Calcium 8.9 8.9 - 10.3 mg/dL   Phosphorus 5.6 (H) 2.5 - 4.6 mg/dL   Albumin 3.4 (L) 3.5 - 5.0 g/dL   GFR calc non Af Amer 7 (L) >60 mL/min   GFR calc Af Amer 8 (L) >60 mL/min    Comment: (NOTE) The eGFR has been calculated using the CKD EPI equation. This calculation has not been validated in all clinical situations. eGFR's persistently <60 mL/min signify possible Chronic Kidney Disease.    Anion gap 8 5 - 15  Glucose, capillary     Status: None   Collection Time: 11/16/16  9:07 AM  Result Value Ref Range   Glucose-Capillary 90 65 - 99 mg/dL   Comment 1 Notify RN    Comment 2 Document in Chart   Glucose, capillary     Status: None   Collection Time: 11/16/16  9:48 AM  Result Value Ref Range   Glucose-Capillary 91 65 - 99 mg/dL  Glucose, capillary     Status: None   Collection Time: 11/16/16 11:44 AM  Result Value Ref Range   Glucose-Capillary 79 65 - 99 mg/dL  Protein,  urine, 24 hour     Status: Abnormal   Collection Time: 11/16/16  1:10 PM  Result Value Ref Range   Urine Total Volume-UPROT 250 mL   Collection Interval-UPROT 24 hours   Protein, Urine 165 mg/dL    Comment: RESULTS CONFIRMED BY MANUAL DILUTION   Protein, 24H Urine 413 (H) 50 - 100 mg/day  Glucose, capillary     Status: Abnormal   Collection Time: 11/16/16  4:37 PM  Result Value Ref Range   Glucose-Capillary 134 (H) 65 - 99 mg/dL   Comment 1 Notify RN    Comment 2 Document in Chart   Glucose, capillary     Status: Abnormal   Collection Time: 11/16/16  9:57 PM  Result Value Ref Range   Glucose-Capillary 123 (H) 65 - 99 mg/dL   Comment 1 Notify RN    Comment 2 Document in Chart   Renal function panel     Status: Abnormal   Collection Time: 11/17/16  5:19 AM  Result Value Ref Range   Sodium 142 135 - 145 mmol/L   Potassium 4.3 3.5 - 5.1 mmol/L   Chloride 113 (H) 101 - 111 mmol/L   CO2 20 (L) 22 - 32 mmol/L   Glucose, Bld 97 65 - 99 mg/dL   BUN 63 (H) 6 - 20 mg/dL   Creatinine, Ser 5.19 (H) 0.44 - 1.00 mg/dL   Calcium 8.9 8.9 - 10.3 mg/dL   Phosphorus 4.7 (H) 2.5 - 4.6 mg/dL   Albumin 3.3 (L) 3.5 - 5.0 g/dL   GFR calc non Af Amer 9 (L) >60 mL/min   GFR calc Af Amer 10 (L) >60 mL/min    Comment: (NOTE) The eGFR has been calculated using the CKD EPI equation. This calculation has not been validated in all clinical situations. eGFR's persistently <60 mL/min signify possible Chronic Kidney Disease.    Anion gap 9 5 - 15  CBC     Status: Abnormal   Collection Time: 11/17/16  5:19 AM  Result Value Ref Range   WBC 4.3 4.0 - 10.5 K/uL   RBC 3.77 (L) 3.87 - 5.11 MIL/uL   Hemoglobin 10.7 (L) 12.0 -  15.0 g/dL   HCT 32.8 (L) 36.0 - 46.0 %   MCV 87.0 78.0 - 100.0 fL   MCH 28.4 26.0 - 34.0 pg   MCHC 32.6 30.0 - 36.0 g/dL   RDW 15.4 11.5 - 15.5 %   Platelets 142 (L) 150 - 400 K/uL  Urinalysis, Routine w reflex microscopic     Status: Abnormal   Collection Time: 11/17/16  6:01 AM   Result Value Ref Range   Color, Urine STRAW (A) YELLOW   APPearance CLEAR CLEAR   Specific Gravity, Urine 1.008 1.005 - 1.030   pH 5.0 5.0 - 8.0   Glucose, UA NEGATIVE NEGATIVE mg/dL   Hgb urine dipstick NEGATIVE NEGATIVE   Bilirubin Urine NEGATIVE NEGATIVE   Ketones, ur NEGATIVE NEGATIVE mg/dL   Protein, ur 100 (A) NEGATIVE mg/dL   Nitrite NEGATIVE NEGATIVE   Leukocytes, UA NEGATIVE NEGATIVE   RBC / HPF 0-5 0 - 5 RBC/hpf   WBC, UA 0-5 0 - 5 WBC/hpf   Bacteria, UA FEW (A) NONE SEEN   Squamous Epithelial / LPF 0-5 (A) NONE SEEN   Hyaline Casts, UA PRESENT   Urine rapid drug screen (hosp performed)     Status: Abnormal   Collection Time: 11/17/16  6:02 AM  Result Value Ref Range   Opiates POSITIVE (A) NONE DETECTED   Cocaine NONE DETECTED NONE DETECTED   Benzodiazepines POSITIVE (A) NONE DETECTED   Amphetamines NONE DETECTED NONE DETECTED   Tetrahydrocannabinol NONE DETECTED NONE DETECTED   Barbiturates NONE DETECTED NONE DETECTED    Comment:        DRUG SCREEN FOR MEDICAL PURPOSES ONLY.  IF CONFIRMATION IS NEEDED FOR ANY PURPOSE, NOTIFY LAB WITHIN 5 DAYS.        LOWEST DETECTABLE LIMITS FOR URINE DRUG SCREEN Drug Class       Cutoff (ng/mL) Amphetamine      1000 Barbiturate      200 Benzodiazepine   767 Tricyclics       341 Opiates          300 Cocaine          300 THC              50   Glucose, capillary     Status: None   Collection Time: 11/17/16  7:29 AM  Result Value Ref Range   Glucose-Capillary 98 65 - 99 mg/dL    ABGS No results for input(s): PHART, PO2ART, TCO2, HCO3 in the last 72 hours.  Invalid input(s): PCO2 CULTURES No results found for this or any previous visit (from the past 240 hour(s)). Studies/Results: No results found.  Medications:  Prior to Admission:  Prescriptions Prior to Admission  Medication Sig Dispense Refill Last Dose  . albuterol (PROVENTIL HFA;VENTOLIN HFA) 108 (90 Base) MCG/ACT inhaler Inhale 2 puffs into the lungs every  4 (four) hours as needed for wheezing or shortness of breath. 1 Inhaler 0 11/14/2016 at Unknown time  . albuterol (PROVENTIL) (2.5 MG/3ML) 0.083% nebulizer solution Inhale 3 mLs (2.5 mg total) into the lungs 3 (three) times daily. 75 mL 12 11/14/2016 at Unknown time  . ALPRAZolam (XANAX) 1 MG tablet Take 1 mg by mouth 4 (four) times daily.     11/14/2016 at Unknown time  . atorvastatin (LIPITOR) 10 MG tablet Take 10 mg by mouth daily. Take along with 20 mg tablet to equal 30 mg daily.   11/14/2016 at Unknown time  . atorvastatin (LIPITOR) 20 MG tablet Take 20 mg  by mouth daily. Take along with 10 mg tablet to equal 30 mg daily.   11/14/2016 at Unknown time  . baclofen (LIORESAL) 20 MG tablet Take 20 mg by mouth 3 (three) times daily as needed for muscle spasms.   11/14/2016 at Unknown time  . carvedilol (COREG) 12.5 MG tablet Take 1 tablet (12.5 mg total) by mouth 2 (two) times daily with a meal. 60 tablet 0 11/14/2016 at [place]  . colchicine 0.6 MG tablet Take 1 tablet (0.6 mg total) by mouth daily. 60 tablet 1 11/14/2016 at Unknown time  . fluticasone (FLONASE) 50 MCG/ACT nasal spray Place 2 sprays into both nostrils at bedtime.   11/13/2016 at Unknown time  . hydrALAZINE (APRESOLINE) 25 MG tablet Take 1 tablet (25 mg total) by mouth every 8 (eight) hours. 90 tablet 12 11/14/2016 at Unknown time  . HYDROcodone-acetaminophen (NORCO/VICODIN) 5-325 MG tablet Take 1 tablet by mouth every 4 (four) hours as needed for moderate pain.   11/14/2016 at Unknown time  . lisinopril-hydrochlorothiazide (PRINZIDE,ZESTORETIC) 20-12.5 MG tablet Take 1 tablet by mouth daily.   11/14/2016 at Unknown time  . metoCLOPramide (REGLAN) 10 MG tablet Take 1 tablet (10 mg total) by mouth every 6 (six) hours as needed for nausea (or headache). 30 tablet 0 11/14/2016 at Unknown time  . PARoxetine (PAXIL) 20 MG tablet Take 1 daily. This is to prevent panic attacks (Patient taking differently: Take 20 mg by mouth daily. Take 1 daily. This is to  prevent panic attacks) 30 tablet 12 11/14/2016 at Unknown time  . polyethylene glycol (MIRALAX / GLYCOLAX) packet Take 17 g by mouth daily. 14 each 0 11/14/2016 at Unknown time  . rOPINIRole (REQUIP) 1 MG tablet Take 1 mg by mouth at bedtime.    11/13/2016 at Unknown time  . furosemide (LASIX) 20 MG tablet Take 20 mg by mouth daily as needed for edema.   unknown  . loperamide (IMODIUM) 2 MG capsule Take 1 capsule (2 mg total) by mouth 4 (four) times daily as needed for diarrhea or loose stools. 12 capsule 0 10/26/2016 at Unknown time   Scheduled: . albuterol  2.5 mg Inhalation TID  . ALPRAZolam  1 mg Oral QID  . atorvastatin  30 mg Oral Daily  . carvedilol  12.5 mg Oral BID WC  . colchicine  0.6 mg Oral Q48H  . fluticasone  2 spray Each Nare QHS  . heparin  5,000 Units Subcutaneous Q8H  . hydrALAZINE  25 mg Oral Q8H  . PARoxetine  20 mg Oral Daily  . rOPINIRole  1 mg Oral QHS   Continuous: . sodium chloride 100 mL/hr at 11/16/16 2243  . furosemide Stopped (11/16/16 1806)   YPP:JKDTOIZTI, baclofen, hydrALAZINE, HYDROcodone-acetaminophen, loperamide, metoCLOPramide  Assesment: She was admitted with acute kidney injury with acute on chronic renal failure. Her chronic kidney disease is now stage V. At baseline she has hypertension and that's pre-well controlled now. She has sleep apnea and uses her CPAP regularly. She has diabetes and that's better controlled. She has obesity but she has lost about 100 pounds Principal Problem:   AKI (acute kidney injury) ([place]) Active Problems:   Essential hypertension   Sleep apnea   Diabetes mellitus, type 2 ([place])   CKD stage 5 secondary to hypertension ([place])    Plan: Continue treatments. Physical therapy consultation. Discontinue laxatives    LOS: 2 days   Marvette Schamp L 11/17/2016, 8:47 AM

## 2016-11-17 NOTE — Care Management (Signed)
    Durable Medical Equipment        Start     Ordered   11/17/16 1109  For home use only DME standard manual wheelchair with seat cushion  Once    Comments:  Patient suffers from weakness which impairs their ability to perform daily activities like walking in the home.  A RW will not resolve issue with performing activities of daily living. A wheelchair will allow patient to safely perform daily activities. Patient can safely propel the wheelchair in the home or has a caregiver who can provide assistance.  Accessories: elevating leg rests (ELRs), wheel locks, extensions and anti-tippers.   11/17/16 1109

## 2016-11-17 NOTE — Progress Notes (Signed)
Subjective: Interval History: Her appetite remains poor. Presently she doesn't have any difficulty breathing. Patient complains of polyuria.  Objective: Vital signs in last 24 hours: Weight change:   Intake/Output from previous day: 04/22 0701 - 04/23 0700 In: 2522.9 [P.O.:360; I.V.:2162.9] Out: 1530 [Urine:1530] Intake/Output this shift: No intake/output data recorded.  General appearance: alert, cooperative and mild distress Resp: diminished breath sounds bilaterally Cardio: regular rate and rhythm Extremities: No edema  Lab Results:  Recent Labs  11/15/16 0032 11/17/16 0519  WBC 5.2 4.3  HGB 12.1 10.7*  HCT 37.5 32.8*  PLT 145* 142*   BMET:   Recent Labs  11/16/16 0310 11/17/16 0519  NA 143 142  K 4.5 4.3  CL 117* 113*  CO2 18* 20*  GLUCOSE 89 97  BUN 73* 63*  CREATININE 6.22* 5.19*  CALCIUM 8.9 8.9   No results for input(s): PTH in the last 72 hours. Iron Studies: No results for input(s): IRON, TIBC, TRANSFERRIN, FERRITIN in the last 72 hours.  Studies/Results: No results found.  I have reviewed the patient's current medications.  Assessment/Plan: Problem #1 acute kidney injury superimposed on chronic: Her renal function started improving. Presently she is on Lasix and nonoliguric.  Problem #2 low CO2 possibly metabolic. She is on sodium bicarbonate and her CO2 is 20 improving.  Problem #3 hypertension: Her blood pressure is reasonably controlled Problem #4 history of sleep apnea: Patient is on CPAP Problem #5 history of diabetes: Her blood sugar is reasonably controlled  Problem #6 history of depression Problem #7 history of degenerative joint disease Problem #8 metabolic bone disease: Calcium and phosphorus is in range Plan: 1]We'll continue with present treatment 2] will check her renal panel in the morning.    LOS: 2 days   Shatana Saxton S 11/17/2016,9:58 AM

## 2016-11-17 NOTE — Evaluation (Signed)
Physical Therapy Evaluation Patient Details Name: Martha [name] MRN: [id number] DOB: 1966-07-08 Today's Date: 11/17/2016   History of Present Illness  51 y.o. female with medical history significant of CKD stage 5, COPD, HTN, DM, OSA.  Patient presents to the ED with c/o 2 day history of headache.  Headache associated with extremity weakness, numbness, and difficulty with gait.  Headache is similar to prior headaches but worse.  8/10 severity, posterior, constant head pain.  Has tried nothing for symptoms thus far.  Difficulty with gait has been ongoing for past 1 week.  Similar but "worse" to gait difficulty with other headaches.  Dx: acute kidney injury superimposed on chronic: Presently her BUN and creatinine is slightly improving. Her urine output however is low. Patient denies any nausea or vomiting. Her appetite however is poor.  Low CO2 possibly metabolic  Clinical Impression  Pt received in bed and is agreeable to PT evaluation.  PT states that she requires assistance from her fiance for ambulation at home, and since her last admission, she has required assistance for ADL's.  This is her 3rd admission in the past 6 months.  She was evaluated 3 weeks ago, and at that point, she was ambulating 98ft independently - with no device.  However, during today's evaluation, she demonstrates difficulty with sit<>stand and requires Min A for this due to posterior lean.  She also required Min A for ambulation x 5ft due to unsteadiness and B LE's were tremulous.  She is recommended to return home with HHPT and contined 24/7 supervision/assistance of her fiance and dtr.  Also recommend that she have a w/c to improve safety with mobility due to multiple falls in the past 6 months..      Follow Up Recommendations Supervision/Assistance - 24 hour;Home health PT    Equipment Recommendations  Wheelchair (measurements PT)    Recommendations for Other Services       Precautions / Restrictions  Precautions Precautions: Fall Precaution Comments: 2 falls - Oct 29 was walking into the house and she stumbled, The other time she was goign to the bathroom, and tripped on her flip flop bedroom shoes. 1 additional fall here in the hospital.  Restrictions Weight Bearing Restrictions: No      Mobility  Bed Mobility Overal bed mobility: Modified Independent             General bed mobility comments: increased time scooting hips to the EOB.    Transfers Overall transfer level: Needs assistance Equipment used: Rolling walker (2 wheeled) Transfers: Sit to/from Stand Sit to Stand: Min assist         General transfer comment: Posterior LOB initially upon standing, needs cues to shift weight anteriorly.  Increased time initially instanding to correct balance.   Ambulation/Gait Ambulation/Gait assistance: Min assist Ambulation Distance (Feet): 20 Feet Assistive device: Rolling walker (2 wheeled) Gait Pattern/deviations: Decreased step length - right;Decreased step length - left;Trunk flexed   Gait velocity interpretation: <1.8 ft/sec, indicative of risk for recurrent falls General Gait Details: Pt is unsteady during gait, and requires cues for continued anterior weight shift and upright posture.  Pt takes short strides. Pt also demonstrates some tremulousness in B LE's.    Stairs            Wheelchair Mobility    Modified Rankin (Stroke Patients Only)       Balance Overall balance assessment: History of Falls;Needs assistance Sitting-balance support: Bilateral upper extremity supported;Feet supported Sitting balance-Leahy Scale: Good  Standing balance support: Bilateral upper extremity supported Standing balance-Leahy Scale: Poor Standing balance comment: Use of RW with Min A for balance due to posterior lean.                              Pertinent Vitals/Pain Pain Assessment: No/denies pain    Home Living   Living Arrangements:  Spouse/significant other;Children (fiance and dtr) Available Help at Discharge: Available 24 hours/day Type of Home: House Home Access: Stairs to enter   [employer] of Steps: 1 step Home Layout: Two level Home Equipment: Shower seat;Toilet riser;Walker - 2 wheels      Prior Function     Gait / Transfers Assistance Needed: pt states that her boyfriend assists her with getting around in the house.  Pt no longer has access to RW, cane or w/c.   ADL's / Homemaking Assistance Needed: since last hospitalization she has required assistance with dressing and bathing from fiance        Hand Dominance   Dominant Hand: Right    Extremity/Trunk Assessment   Upper Extremity Assessment Upper Extremity Assessment: Overall WFL for tasks assessed    Lower Extremity Assessment Lower Extremity Assessment: Generalized weakness       Communication   Communication: No difficulties  Cognition Arousal/Alertness: Awake/alert Behavior During Therapy: WFL for tasks assessed/performed Overall Cognitive Status: Within Functional Limits for tasks assessed                                        General Comments      Exercises Other Exercises Other Exercises: Sit<>stand x 2 from the recliner with focus on anterior weight shift and B hip extension when coming up into standing position.    Assessment/Plan    PT Assessment Patient needs continued PT services  PT Problem List Decreased strength;Decreased activity tolerance;Decreased balance;Decreased mobility;Decreased knowledge of use of DME;Decreased safety awareness;Obesity       PT Treatment Interventions DME instruction;Gait training;Functional mobility training;Therapeutic activities;Therapeutic exercise;Balance training;Patient/family education    PT Goals (Current goals can be found in the Care Plan section)  Acute Rehab PT Goals Patient Stated Goal: to go home PT Goal Formulation: With patient Time For  Goal Achievement: 12/01/16 Potential to Achieve Goals: Fair    Frequency Min 3X/week   Barriers to discharge        Co-evaluation               End of Session Equipment Utilized During Treatment: Gait belt Activity Tolerance: Patient tolerated treatment well Patient left: in chair;with call bell/phone within reach Nurse Communication: Mobility status (Lauren, RN notified of pt's mobility status.  Mobility sheet left in the room. ) PT Visit Diagnosis: Muscle weakness (generalized) (M62.81);History of falling (Z91.81);Other abnormalities of gait and mobility (R26.89)    Time: 4481-8563 PT Time Calculation (min) (ACUTE ONLY): 38 min   Charges:   PT Evaluation $PT Eval Low Complexity: 1 Procedure PT Treatments $Gait Training: 8-22 mins $Therapeutic Activity: 8-22 mins   PT G Codes:   PT G-Codes **NOT FOR INPATIENT CLASS** Functional Assessment Tool Used: AM-PAC 6 Clicks Basic Mobility;Clinical judgement Functional Limitation: Mobility: Walking and moving around Mobility: Walking and Moving Around Current Status ([id number]): At least 20 percent but less than 40 percent impaired, limited or restricted Mobility: Walking and Moving Around Goal Status [phone]): At least 1  percent but less than 20 percent impaired, limited or restricted    Beth Kyeisha Janowicz, PT, DPT X: [phone]

## 2016-11-18 ENCOUNTER — Telehealth (HOSPITAL_COMMUNITY): Payer: Self-pay | Admitting: *Deleted

## 2016-11-18 LAB — RENAL FUNCTION PANEL
ALBUMIN: 3.4 g/dL — AB (ref 3.5–5.0)
ANION GAP: 9 (ref 5–15)
BUN: 57 mg/dL — AB (ref 6–20)
CALCIUM: 8.8 mg/dL — AB (ref 8.9–10.3)
CO2: 19 mmol/L — AB (ref 22–32)
CREATININE: 4.84 mg/dL — AB (ref 0.44–1.00)
Chloride: 113 mmol/L — ABNORMAL HIGH (ref 101–111)
GFR calc Af Amer: 11 mL/min — ABNORMAL LOW (ref >60)
GFR calc non Af Amer: 10 mL/min — ABNORMAL LOW (ref >60)
GLUCOSE: 94 mg/dL (ref 65–99)
PHOSPHORUS: 3.7 mg/dL (ref 2.5–4.6)
Potassium: 4.2 mmol/L (ref 3.5–5.1)
SODIUM: 141 mmol/L (ref 135–145)

## 2016-11-18 LAB — GLUCOSE, CAPILLARY
GLUCOSE-CAPILLARY: 119 mg/dL — AB (ref 65–99)
Glucose-Capillary: 94 mg/dL (ref 65–99)
Glucose-Capillary: 127 mg/dL — ABNORMAL HIGH (ref 65–99)
Glucose-Capillary: 115 mg/dL — ABNORMAL HIGH (ref 65–99)

## 2016-11-18 MED ORDER — TORSEMIDE 20 MG PO TABS
40.0000 mg | ORAL_TABLET | Freq: Every day | ORAL | Status: DC
  Administered 2016-11-18 – 2016-11-19 (×2): 40 mg via ORAL
  Filled 2016-11-18 (×2): qty 2

## 2016-11-18 MED ORDER — ALUM & MAG HYDROXIDE-SIMETH 200-200-20 MG/5ML PO SUSP
15.0000 mL | Freq: Four times a day (QID) | ORAL | Status: DC | PRN
  Administered 2016-11-18: 15 mL via ORAL
  Filled 2016-11-18: qty 30

## 2016-11-18 NOTE — Progress Notes (Signed)
Patient requests for all four side rails to be up while in bed.

## 2016-11-18 NOTE — Telephone Encounter (Signed)
Phone call regarding an appointment. no voice mail set up.

## 2016-11-18 NOTE — Procedures (Signed)
Patient has home CPAP bedside and ready for use.  Patient places on herself when ready for bed.

## 2016-11-18 NOTE — Progress Notes (Signed)
Subjective: She says she feels better. Poor appetite. No nausea or vomiting. Still has some diffuse pain.  Objective: Vital signs in last 24 hours: Temp:  [97.5 F (36.4 C)-98.8 F (37.1 C)] 98.8 F (37.1 C) (04/24 0520) Pulse Rate:  [74-94] 94 (04/24 0520) Resp:  [16-20] 16 (04/24 0520) BP: (115-168)/(70-83) 168/83 (04/24 0520) SpO2:  [91 %-99 %] 96 % (04/24 0520) Weight change:  Last BM Date: 11/17/16  Intake/Output from previous day: 04/23 0701 - 04/24 0700 In: 2515 [P.O.:480; I.V.:2035] Out: -   PHYSICAL EXAM General appearance: alert, cooperative and no distress Resp: clear to auscultation bilaterally Cardio: regular rate and rhythm, S1, S2 normal, no murmur, click, rub or gallop GI: soft, non-tender; bowel sounds normal; no masses,  no organomegaly Extremities: extremities normal, atraumatic, no cyanosis or edema Skin warm and dry. Mucous membranes are moist  Lab Results:  Results for orders placed or performed during the hospital encounter of 11/14/16 (from the past 48 hour(s))  Glucose, capillary     Status: None   Collection Time: 11/16/16  9:07 AM  Result Value Ref Range   Glucose-Capillary 90 65 - 99 mg/dL   Comment 1 Notify RN    Comment 2 Document in Chart   Glucose, capillary     Status: None   Collection Time: 11/16/16  9:48 AM  Result Value Ref Range   Glucose-Capillary 91 65 - 99 mg/dL  PTH, intact and calcium     Status: None   Collection Time: 11/16/16 11:28 AM  Result Value Ref Range   PTH 61 15 - 65 pg/mL   Calcium, Total (PTH) 8.7 8.7 - 10.2 mg/dL   PTH Comment     Comment: (NOTE) Interpretation                 Intact PTH    Calcium                                (pg/mL)      (mg/dL) Normal                          15 - 65     8.6 - 10.2 Primary Hyperparathyroidism         >65          >10.2 Secondary Hyperparathyroidism       >65          <10.2 Non-Parathyroid Hypercalcemia       <65          >10.2 Hypoparathyroidism                  <15           < 8.6 Non-Parathyroid Hypocalcemia    15 - 65          < 8.6 Performed At: [facility] [place], [place] [id number] Lindon Romp MD [id number]   VITAMIN D 25 Hydroxy (Vit-D Deficiency, Fractures)     Status: Abnormal   Collection Time: 11/16/16 11:28 AM  Result Value Ref Range   Vit D, 25-Hydroxy 7.0 (L) 30.0 - 100.0 ng/mL    Comment: (NOTE) Vitamin D deficiency has been defined by the Institute of Medicine and an Endocrine Society practice guideline as a level of serum 25-OH vitamin D less than 20 ng/mL (1,2). The Endocrine Society went on to further define vitamin D insufficiency  as a level between 21 and 29 ng/mL (2). 1. IOM (Institute of Medicine). 2010. Dietary reference   intakes for calcium and D. [place]: The   [employer]. 2. Holick MF, [place], Bischoff-Ferrari HA, et al.   Evaluation, treatment, and prevention of vitamin D   deficiency: an Endocrine Society clinical practice   guideline. JCEM. 2011 Jul; 96(7):1911-30. Performed At: [facility] [place], [place] [id number] Lindon Romp MD [id number]   Glucose, capillary     Status: None   Collection Time: 11/16/16 11:44 AM  Result Value Ref Range   Glucose-Capillary 79 65 - 99 mg/dL  Protein, urine, 24 hour     Status: Abnormal   Collection Time: 11/16/16  1:10 PM  Result Value Ref Range   Urine Total Volume-UPROT 250 mL   Collection Interval-UPROT 24 hours   Protein, Urine 165 mg/dL    Comment: RESULTS CONFIRMED BY MANUAL DILUTION   Protein, 24H Urine 413 (H) 50 - 100 mg/day  Glucose, capillary     Status: Abnormal   Collection Time: 11/16/16  4:37 PM  Result Value Ref Range   Glucose-Capillary 134 (H) 65 - 99 mg/dL   Comment 1 Notify RN    Comment 2 Document in Chart   Glucose, capillary     Status: Abnormal   Collection Time: 11/16/16  9:57 PM  Result Value Ref Range   Glucose-Capillary 123 (H) 65 - 99 mg/dL   Comment  1 Notify RN    Comment 2 Document in Chart   Renal function panel     Status: Abnormal   Collection Time: 11/17/16  5:19 AM  Result Value Ref Range   Sodium 142 135 - 145 mmol/L   Potassium 4.3 3.5 - 5.1 mmol/L   Chloride 113 (H) 101 - 111 mmol/L   CO2 20 (L) 22 - 32 mmol/L   Glucose, Bld 97 65 - 99 mg/dL   BUN 63 (H) 6 - 20 mg/dL   Creatinine, Ser 5.19 (H) 0.44 - 1.00 mg/dL   Calcium 8.9 8.9 - 10.3 mg/dL   Phosphorus 4.7 (H) 2.5 - 4.6 mg/dL   Albumin 3.3 (L) 3.5 - 5.0 g/dL   GFR calc non Af Amer 9 (L) >60 mL/min   GFR calc Af Amer 10 (L) >60 mL/min    Comment: (NOTE) The eGFR has been calculated using the CKD EPI equation. This calculation has not been validated in all clinical situations. eGFR's persistently <60 mL/min signify possible Chronic Kidney Disease.    Anion gap 9 5 - 15  CBC     Status: Abnormal   Collection Time: 11/17/16  5:19 AM  Result Value Ref Range   WBC 4.3 4.0 - 10.5 K/uL   RBC 3.77 (L) 3.87 - 5.11 MIL/uL   Hemoglobin 10.7 (L) 12.0 - 15.0 g/dL   HCT 32.8 (L) 36.0 - 46.0 %   MCV 87.0 78.0 - 100.0 fL   MCH 28.4 26.0 - 34.0 pg   MCHC 32.6 30.0 - 36.0 g/dL   RDW 15.4 11.5 - 15.5 %   Platelets 142 (L) 150 - 400 K/uL  Urinalysis, Routine w reflex microscopic     Status: Abnormal   Collection Time: 11/17/16  6:01 AM  Result Value Ref Range   Color, Urine STRAW (A) YELLOW   APPearance CLEAR CLEAR   Specific Gravity, Urine 1.008 1.005 - 1.030   pH 5.0 5.0 - 8.0   Glucose, UA NEGATIVE NEGATIVE mg/dL  Hgb urine dipstick NEGATIVE NEGATIVE   Bilirubin Urine NEGATIVE NEGATIVE   Ketones, ur NEGATIVE NEGATIVE mg/dL   Protein, ur 100 (A) NEGATIVE mg/dL   Nitrite NEGATIVE NEGATIVE   Leukocytes, UA NEGATIVE NEGATIVE   RBC / HPF 0-5 0 - 5 RBC/hpf   WBC, UA 0-5 0 - 5 WBC/hpf   Bacteria, UA FEW (A) NONE SEEN   Squamous Epithelial / LPF 0-5 (A) NONE SEEN   Hyaline Casts, UA PRESENT   Urine rapid drug screen (hosp performed)     Status: Abnormal   Collection  Time: 11/17/16  6:02 AM  Result Value Ref Range   Opiates POSITIVE (A) NONE DETECTED   Cocaine NONE DETECTED NONE DETECTED   Benzodiazepines POSITIVE (A) NONE DETECTED   Amphetamines NONE DETECTED NONE DETECTED   Tetrahydrocannabinol NONE DETECTED NONE DETECTED   Barbiturates NONE DETECTED NONE DETECTED    Comment:        DRUG SCREEN FOR MEDICAL PURPOSES ONLY.  IF CONFIRMATION IS NEEDED FOR ANY PURPOSE, NOTIFY LAB WITHIN 5 DAYS.        LOWEST DETECTABLE LIMITS FOR URINE DRUG SCREEN Drug Class       Cutoff (ng/mL) Amphetamine      1000 Barbiturate      200 Benzodiazepine   952 Tricyclics       841 Opiates          300 Cocaine          300 THC              50   Glucose, capillary     Status: None   Collection Time: 11/17/16  7:29 AM  Result Value Ref Range   Glucose-Capillary 98 65 - 99 mg/dL  Glucose, capillary     Status: Abnormal   Collection Time: 11/17/16 11:15 AM  Result Value Ref Range   Glucose-Capillary 129 (H) 65 - 99 mg/dL  Glucose, capillary     Status: Abnormal   Collection Time: 11/17/16  4:03 PM  Result Value Ref Range   Glucose-Capillary 102 (H) 65 - 99 mg/dL   Comment 1 Notify RN    Comment 2 Document in Chart   Glucose, capillary     Status: Abnormal   Collection Time: 11/17/16  9:17 PM  Result Value Ref Range   Glucose-Capillary 126 (H) 65 - 99 mg/dL   Comment 1 Notify RN    Comment 2 Document in Chart   Renal function panel     Status: Abnormal   Collection Time: 11/18/16  4:55 AM  Result Value Ref Range   Sodium 141 135 - 145 mmol/L   Potassium 4.2 3.5 - 5.1 mmol/L   Chloride 113 (H) 101 - 111 mmol/L   CO2 19 (L) 22 - 32 mmol/L   Glucose, Bld 94 65 - 99 mg/dL   BUN 57 (H) 6 - 20 mg/dL   Creatinine, Ser 4.84 (H) 0.44 - 1.00 mg/dL   Calcium 8.8 (L) 8.9 - 10.3 mg/dL   Phosphorus 3.7 2.5 - 4.6 mg/dL   Albumin 3.4 (L) 3.5 - 5.0 g/dL   GFR calc non Af Amer 10 (L) >60 mL/min   GFR calc Af Amer 11 (L) >60 mL/min    Comment: (NOTE) The eGFR has  been calculated using the CKD EPI equation. This calculation has not been validated in all clinical situations. eGFR's persistently <60 mL/min signify possible Chronic Kidney Disease.    Anion gap 9 5 - 15  Glucose, capillary  Status: None   Collection Time: 11/18/16  7:57 AM  Result Value Ref Range   Glucose-Capillary 94 65 - 99 mg/dL   Comment 1 Notify RN    Comment 2 Document in Chart     ABGS No results for input(s): PHART, PO2ART, TCO2, HCO3 in the last 72 hours.  Invalid input(s): PCO2 CULTURES No results found for this or any previous visit (from the past 240 hour(s)). Studies/Results: No results found.  Medications:  Prior to Admission:  Prescriptions Prior to Admission  Medication Sig Dispense Refill Last Dose  . albuterol (PROVENTIL HFA;VENTOLIN HFA) 108 (90 Base) MCG/ACT inhaler Inhale 2 puffs into the lungs every 4 (four) hours as needed for wheezing or shortness of breath. 1 Inhaler 0 11/14/2016 at Unknown time  . albuterol (PROVENTIL) (2.5 MG/3ML) 0.083% nebulizer solution Inhale 3 mLs (2.5 mg total) into the lungs 3 (three) times daily. 75 mL 12 11/14/2016 at Unknown time  . ALPRAZolam (XANAX) 1 MG tablet Take 1 mg by mouth 4 (four) times daily.     11/14/2016 at Unknown time  . atorvastatin (LIPITOR) 10 MG tablet Take 10 mg by mouth daily. Take along with 20 mg tablet to equal 30 mg daily.   11/14/2016 at Unknown time  . atorvastatin (LIPITOR) 20 MG tablet Take 20 mg by mouth daily. Take along with 10 mg tablet to equal 30 mg daily.   11/14/2016 at Unknown time  . baclofen (LIORESAL) 20 MG tablet Take 20 mg by mouth 3 (three) times daily as needed for muscle spasms.   11/14/2016 at Unknown time  . carvedilol (COREG) 12.5 MG tablet Take 1 tablet (12.5 mg total) by mouth 2 (two) times daily with a meal. 60 tablet 0 11/14/2016 at 900a  . colchicine 0.6 MG tablet Take 1 tablet (0.6 mg total) by mouth daily. 60 tablet 1 11/14/2016 at Unknown time  . fluticasone (FLONASE) 50  MCG/ACT nasal spray Place 2 sprays into both nostrils at bedtime.   11/13/2016 at Unknown time  . hydrALAZINE (APRESOLINE) 25 MG tablet Take 1 tablet (25 mg total) by mouth every 8 (eight) hours. 90 tablet 12 11/14/2016 at Unknown time  . HYDROcodone-acetaminophen (NORCO/VICODIN) 5-325 MG tablet Take 1 tablet by mouth every 4 (four) hours as needed for moderate pain.   11/14/2016 at Unknown time  . lisinopril-hydrochlorothiazide (PRINZIDE,ZESTORETIC) 20-12.5 MG tablet Take 1 tablet by mouth daily.   11/14/2016 at Unknown time  . metoCLOPramide (REGLAN) 10 MG tablet Take 1 tablet (10 mg total) by mouth every 6 (six) hours as needed for nausea (or headache). 30 tablet 0 11/14/2016 at Unknown time  . PARoxetine (PAXIL) 20 MG tablet Take 1 daily. This is to prevent panic attacks (Patient taking differently: Take 20 mg by mouth daily. Take 1 daily. This is to prevent panic attacks) 30 tablet 12 11/14/2016 at Unknown time  . polyethylene glycol (MIRALAX / GLYCOLAX) packet Take 17 g by mouth daily. 14 each 0 11/14/2016 at Unknown time  . rOPINIRole (REQUIP) 1 MG tablet Take 1 mg by mouth at bedtime.    11/13/2016 at Unknown time  . furosemide (LASIX) 20 MG tablet Take 20 mg by mouth daily as needed for edema.   unknown  . loperamide (IMODIUM) 2 MG capsule Take 1 capsule (2 mg total) by mouth 4 (four) times daily as needed for diarrhea or loose stools. 12 capsule 0 10/26/2016 at Unknown time   Scheduled: . albuterol  2.5 mg Inhalation TID  . ALPRAZolam  1  mg Oral QID  . atorvastatin  30 mg Oral Daily  . carvedilol  12.5 mg Oral BID WC  . colchicine  0.6 mg Oral Q48H  . fluticasone  2 spray Each Nare QHS  . heparin  5,000 Units Subcutaneous Q8H  . hydrALAZINE  25 mg Oral Q8H  . PARoxetine  20 mg Oral Daily  . rOPINIRole  1 mg Oral QHS  . torsemide  40 mg Oral Daily   Continuous:  [id number], baclofen, hydrALAZINE, HYDROcodone-acetaminophen, loperamide, metoCLOPramide  Assesment: She was admitted with  acute on chronic renal failure with acute kidney injury. She is improving. She is approaching dialysis. Her renal function is better. She has hypertension at baseline and that's pre-well controlled. She has diabetes at baseline and that has not been well controlled but she has now agreed to go to an endocrinologist so I have hopes that we can get better control of her blood sugar. Principal Problem:   AKI (acute kidney injury) ([place]) Active Problems:   Essential hypertension   Sleep apnea   Diabetes mellitus, type 2 ([place])   CKD stage 5 secondary to hypertension ([place])    Plan: IV fluids been discontinued. She's going to switch to oral diuretics and will probably be discharged home tomorrow    LOS: 3 days   Kody Vigil L 11/18/2016, 9:01 AM

## 2016-11-18 NOTE — Progress Notes (Signed)
Subjective: Interval History: She is feeling much better. Presently she offers no complaints. She denies any nausea or vomiting. Her appetite however is still poor.  Objective: Vital signs in last 24 hours: Weight change:   Intake/Output from previous day: 04/23 0701 - 04/24 0700 In: 2515 [P.O.:480; I.V.:2035] Out: -  Intake/Output this shift: No intake/output data recorded.  General appearance: alert, cooperative and mild distress Resp: diminished breath sounds bilaterally Cardio: regular rate and rhythm Extremities: No edema  Lab Results:  Recent Labs  11/17/16 0519  WBC 4.3  HGB 10.7*  HCT 32.8*  PLT 142*   BMET:   Recent Labs  11/17/16 0519 11/18/16 0455  NA 142 141  K 4.3 4.2  CL 113* 113*  CO2 20* 19*  GLUCOSE 97 94  BUN 63* 57*  CREATININE 5.19* 4.84*  CALCIUM 8.9 8.8*    Recent Labs  11/16/16 1128  PTH 61  Comment   Iron Studies: No results for input(s): IRON, TIBC, TRANSFERRIN, FERRITIN in the last 72 hours.  Studies/Results: No results found.  I have reviewed the patient's current medications.  Assessment/Plan: Problem #1 acute kidney injury superimposed on chronic: Her renal function Continued to improve. Presently returning to her baseline. Problem #2 low CO2 possibly metabolic. She is on sodium bicarbonate and her CO2 is 19 . Stable Problem #3 hypertension: Her blood pressure is reasonably controlled Problem #4 history of sleep apnea: Patient is on CPAP Problem #5 history of diabetes: Her blood sugar is reasonably controlled  Problem #6 history of depression Problem #7 history of degenerative joint disease Problem #8 metabolic bone disease: Calcium and phosphorus is in range Plan: 1] will DC IV fluid 2] will DC Lasix 3] will start on Demadex 40 mg by mouth once a day 4] will check her renal panel in the morning. 5] will make arrangements for patient to have a fistula placed as an outpatient.    LOS: 3 days   Tressa Maldonado  S 11/18/2016,7:46 AM

## 2016-11-18 NOTE — Progress Notes (Signed)
Patient did not want to complete nebulizer treatment stating it made her "shaky." Treatment terminated, patient returned to room air and she was told RT would return during afternoon treatment time.

## 2016-11-18 NOTE — Evaluation (Signed)
Occupational Therapy Evaluation Patient Details Name: Martha [name] MRN: [id number] DOB: January 29, 1966 Today's Date: 11/18/2016    History of Present Illness 51 y.o. female with medical history significant of CKD stage 5, COPD, HTN, DM, OSA.  Patient presents to the ED with c/o 2 day history of headache.  Headache associated with extremity weakness, numbness, and difficulty with gait.  Headache is similar to prior headaches but worse.  8/10 severity, posterior, constant head pain.  Has tried nothing for symptoms thus far.  Difficulty with gait has been ongoing for past 1 week.  Similar but "worse" to gait difficulty with other headaches.  Dx: acute kidney injury superimposed on chronic: Presently her BUN and creatinine is slightly improving. Her urine output however is low. Patient denies any nausea or vomiting. Her appetite however is poor.  Low CO2 possibly metabolic   Clinical Impression   Patient in bed upon therapy arrival and was agreeable to participate in OT evaluation. Patient requesting to use the bathroom while therapist was present. Patient presents with decreased UB strength and endurance as well as increased need for assistance to complete ADL tasks. Patient with decrease activity tolerance and endurance which causes her to give up and request help instead of finishing tasks herself. Skilled OT services required to focus on mentioned deficits in order to allow patient to return home safely with more independence.     Follow Up Recommendations  Home health OT;Supervision/Assistance - 24 hour    Equipment Recommendations  None recommended by OT       Precautions / Restrictions Precautions Precautions: Fall Precaution Comments: 2 falls - Oct 29 was walking into the house and she stumbled, The other time she was goign to the bathroom, and tripped on her flip flop bedroom shoes. 1 additional fall here in the hospital.  Restrictions Weight Bearing Restrictions: No      Mobility Bed  Mobility Overal bed mobility: Modified Independent             General bed mobility comments: increased time scooting hips to the EOB.    Transfers Overall transfer level: Needs assistance Equipment used: Rolling walker (2 wheeled) Transfers: Sit to/from Stand Sit to Stand: Min assist         General transfer comment: Cueing to bring weight forward when transitioning from sit to stand.        ADL either performed or assessed with clinical judgement   ADL Overall ADL's : Needs assistance/impaired Eating/Feeding: Set up;Bed level               Upper Body Dressing : Total assistance;Sitting Upper Body Dressing Details (indicate cue type and reason): donning hospital gown Lower Body Dressing: Minimal assistance;Sit to/from stand;Sitting/lateral leans;Cueing for safety;Cueing for sequencing   Toilet Transfer: Minimal assistance;Regular Toilet;Grab bars;RW;Cueing for safety   Toileting- Clothing Manipulation and Hygiene: Total assistance;Sit to/from stand Toileting - Clothing Manipulation Details (indicate cue type and reason): toilet hygiene                          Pertinent Vitals/Pain Pain Assessment: No/denies pain     Hand Dominance Right   Extremity/Trunk Assessment Upper Extremity Assessment Upper Extremity Assessment: Generalized weakness   Lower Extremity Assessment Lower Extremity Assessment: Defer to PT evaluation       Communication Communication Communication: No difficulties   Cognition Arousal/Alertness: Awake/alert Behavior During Therapy: WFL for tasks assessed/performed Overall Cognitive Status: Within Functional Limits for tasks assessed  Exercises Other Exercises Other Exercises: Pt given and educated on BUE strengthening HEP with red theraband focusing on shoulder strengthening. All questions answered. Patient provided demonstration and was able to return with cues.    Shoulder Instructions      Home Living Family/patient expects to be discharged to:: Private residence Living Arrangements: Spouse/significant other;Children (fiance and daughter) Available Help at Discharge: Available 24 hours/day Type of Home: House Home Access: Stairs to enter [employer] of Steps: 1 step   Home Layout: Two level Alternate Level Stairs-Number of Steps: 1 pt lives on the main level, and dtr lives on the top   Bathroom Shower/Tub: Teacher, early years/pre: Standard     Home Equipment: Civil engineer, contracting;Toilet riser;Walker - 2 wheels   Additional Comments: Pt has acces to RW, cane, w/c, but states they are not hers, she is just borrowing them. .      Prior Functioning/Environment Level of Independence: Independent with assistive device(s)  Gait / Transfers Assistance Needed: pt states that her fiance assists her with getting around in the house.  Pt no longer has access to RW, cane or w/c.  ADL's / Homemaking Assistance Needed: since last hospitalization she has required assistance with dressing and bathing from fiance            OT Problem List: Decreased strength;Decreased knowledge of use of DME or AE;Decreased activity tolerance      OT Treatment/Interventions: Self-care/ADL training;Therapeutic exercise;Energy conservation;DME and/or AE instruction;Patient/family education;Therapeutic activities    OT Goals(Current goals can be found in the care plan section) Acute Rehab OT Goals Patient Stated Goal: to go home OT Goal Formulation: With patient Time For Goal Achievement: 12/02/16 Potential to Achieve Goals: Good  OT Frequency: Min 2X/week   Barriers to D/C:    None          End of Session Equipment Utilized During Treatment: Gait belt;Rolling walker  Activity Tolerance: Patient tolerated treatment well Patient left: in bed;with call bell/phone within reach;with family/visitor present;with nursing/sitter in room  OT Visit  Diagnosis: Muscle weakness (generalized) (M62.81)                Time: 4854-6270 OT Time Calculation (min): 30 min Charges:  OT General Charges $OT Visit: 1 Procedure OT Evaluation $OT Eval Low Complexity: 1 Procedure OT Treatments $Self Care/Home Management : 8-22 mins (8') G-Codes: OT G-codes **NOT FOR INPATIENT CLASS** Functional Assessment Tool Used: AM-PAC 6 Clicks Daily Activity Functional Limitation: Self care Self Care Current Status (J5009): At least 40 percent but less than 60 percent impaired, limited or restricted Self Care Goal Status (F8182): At least 20 percent but less than 40 percent impaired, limited or restricted   Ailene Ravel, OTR/L,CBIS  [phone]   Evon Dejarnett, Clarene Duke 11/18/2016, 12:40 PM

## 2016-11-19 LAB — GLUCOSE, CAPILLARY
Glucose-Capillary: 92 mg/dL (ref 65–99)
Glucose-Capillary: 118 mg/dL — ABNORMAL HIGH (ref 65–99)

## 2016-11-19 LAB — RENAL FUNCTION PANEL
ALBUMIN: 3.5 g/dL (ref 3.5–5.0)
Anion gap: 8 (ref 5–15)
BUN: 52 mg/dL — AB (ref 6–20)
CO2: 22 mmol/L (ref 22–32)
Calcium: 9.3 mg/dL (ref 8.9–10.3)
Chloride: 112 mmol/L — ABNORMAL HIGH (ref 101–111)
Creatinine, Ser: 4.58 mg/dL — ABNORMAL HIGH (ref 0.44–1.00)
GFR calc Af Amer: 12 mL/min — ABNORMAL LOW (ref >60)
GFR calc non Af Amer: 10 mL/min — ABNORMAL LOW (ref >60)
GLUCOSE: 110 mg/dL — AB (ref 65–99)
PHOSPHORUS: 3.1 mg/dL (ref 2.5–4.6)
Potassium: 4.2 mmol/L (ref 3.5–5.1)
SODIUM: 142 mmol/L (ref 135–145)

## 2016-11-19 MED ORDER — COLCHICINE 0.6 MG PO TABS: 0.6000 mg | ORAL_TABLET | ORAL | 12 refills | Status: DC

## 2016-11-19 MED ORDER — TORSEMIDE 20 MG PO TABS: 40.0000 mg | ORAL_TABLET | Freq: Every day | ORAL | 12 refills | Status: DC

## 2016-11-19 NOTE — Progress Notes (Signed)
Patient up to use BSC. Patient awake and alert. Patient stating that her daughter was in her room holding a box for a long time, asking this RN to help daughter. No one else in room at that time. Then patient stated that her daughter wasn't breathing, asking this RN to check on her. This RN informed patient that daughter was not in room. Patient stated that she saw the outline f her daughters face, pointing to the chair with pillows and blankets on it. Patient strongly stated that her daughter was in the room. This RN reassured patient multiple times that no one was in the room. Patient assisted back to bed. Bed alarm on. Patient requests that all four side rails are up. Denies needs at this time.

## 2016-11-19 NOTE — Care Management Note (Signed)
Case Management Note  Patient Details  Name: LUDY MESSAMORE MRN: [id number] Date of Birth: 04/19/1966    Expected Discharge Date:  11/19/16               Expected Discharge Plan:  Home/Self Care  In-House Referral:     Discharge planning Services  CM Consult  Post Acute Care Choice:  Durable Medical Equipment Choice offered to:  Patient  DME Arranged:  Wheelchair manual DME Agency:  [place]:    [place]:     Status of Service:  Completed, signed off  If discussed at [employer] of Stay Meetings, dates discussed:    Additional Comments: Patient discharging home today. Heavy duty WC will be delivered to patient's home.  Dearis Danis, Chauncey [place], RN 11/19/2016, 10:49 AM

## 2016-11-19 NOTE — Discharge Summary (Signed)
Physician Discharge Summary  Patient ID: Martha [name] MRN: [id number] DOB/AGE: 1965/09/28 51 y.o. 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   albuterol (2.5 MG/3ML) 0.083% nebulizer solution Commonly known as:  PROVENTIL Inhale 3 mLs (2.5 mg total) into the lungs 3 (three) times daily.   ALPRAZolam 1 MG tablet Commonly known as:  XANAX Take 1 mg by mouth 4 (four) times daily.   atorvastatin 20 MG tablet Commonly known as:  LIPITOR Take 20 mg by mouth daily. Take along with 10 mg tablet to equal 30 mg daily.   atorvastatin 10 MG tablet Commonly known as:  LIPITOR Take 10 mg by mouth daily. Take along with 20 mg tablet to equal 30 mg daily.   baclofen 20 MG tablet Commonly known as:  LIORESAL Take 20 mg by mouth 3 (three) times daily as needed for muscle spasms.   carvedilol 12.5 MG tablet Commonly known as:  COREG Take 1 tablet (12.5 mg total) by mouth 2 (two) times daily with a meal.   colchicine 0.6 MG tablet Take 1 tablet (0.6 mg total) by mouth every other day. What changed:  when to take this   fluticasone 50 MCG/ACT nasal spray Commonly known as:  FLONASE Place 2  sprays into both nostrils at bedtime.   hydrALAZINE 25 MG tablet Commonly known as:  APRESOLINE Take 1 tablet (25 mg total) by mouth every 8 (eight) hours.   HYDROcodone-acetaminophen 5-325 MG tablet Commonly known as:  NORCO/VICODIN Take 1 tablet by mouth every 4 (four) hours as needed for moderate pain.   loperamide 2 MG capsule Commonly known as:  IMODIUM Take 1 capsule (2 mg total) by mouth 4 (four) times daily as needed for diarrhea or loose stools.   metoCLOPramide 10 MG tablet Commonly known as:  REGLAN Take 1 tablet (10 mg total) by mouth every 6 (six) hours as needed for nausea (or headache).   PARoxetine 20 MG tablet Commonly known as:  PAXIL Take 1 daily. This is to prevent panic attacks What changed:  how much to take  how to take this  when to take this  additional instructions   polyethylene glycol packet Commonly known as:  MIRALAX / GLYCOLAX Take 17 g by mouth daily.   rOPINIRole 1 MG tablet Commonly known as:  REQUIP Take 1 mg by mouth at bedtime.   torsemide 20 MG tablet Commonly known as:  DEMADEX Take 2 tablets (40 mg total) by mouth daily.            Durable Medical Equipment        Start     Ordered   11/17/16 1545  For  home use only DME standard manual wheelchair with seat cushion  Once    Comments:  Patient suffers from weakness which impairs their ability to perform daily activities like walking in the home.  A RW will not resolve issue with performing activities of daily living. A wheelchair will allow patient to safely perform daily activities. Patient can safely propel the wheelchair in the home or has a caregiver who can provide assistance.  Accessories: elevating leg rests (ELRs), wheel locks, extensions and anti-tippers. Will need heavy Duty Wheelchair.   11/17/16 1546      Discharged Condition:Improved    Consults: Nephrology  Significant Diagnostic Studies: Dg Chest 2 View  Result Date: 10/23/2016 CLINICAL DATA:   Onset of shortness of breath with exertion last night associated with coughing and diarrhea. History of COPD. Former smoker. EXAM: CHEST  2 VIEW COMPARISON:  PA and lateral chest x-ray of July 26, 2016 FINDINGS: The lungs are well-expanded and clear. The heart and pulmonary vascularity are normal. The mediastinum is normal in width. There is no pleural effusion. The bony thorax exhibits no acute abnormality. IMPRESSION: There is no pneumonia nor other acute cardiopulmonary abnormality. Electronically Signed   By: David  Martinique M.D.   On: 10/23/2016 08:40   Ct Head Wo Contrast  Result Date: 11/15/2016 CLINICAL DATA:  Two day history of headache. Patient woke from sleep with bilateral arm and leg weakness. EXAM: CT HEAD WITHOUT CONTRAST TECHNIQUE: Contiguous axial images were obtained from the base of the skull through the vertex without intravenous contrast. COMPARISON:  10/27/2016 FINDINGS: Brain: No evidence of acute infarction, hemorrhage, hydrocephalus, extra-axial collection or mass lesion/mass effect. Vascular: No hyperdense vessel or unexpected calcification. Skull: Normal. Negative for fracture or focal lesion. Sinuses/Orbits: Probable old medial wall fracture of the right orbit. Paranasal sinuses and mastoid air cells are clear. Other: No change since prior study. IMPRESSION: No acute intracranial abnormalities. Electronically Signed   By: Lucienne Capers M.D.   On: 11/15/2016 03:40   Ct Head Wo Contrast  Result Date: 10/27/2016 CLINICAL DATA:  Headache, chest pain, bilateral hand numbness EXAM: CT HEAD WITHOUT CONTRAST TECHNIQUE: Contiguous axial images were obtained from the base of the skull through the vertex without intravenous contrast. COMPARISON:  06/21/2015 FINDINGS: Brain: No evidence of acute infarction, hemorrhage, hydrocephalus, extra-axial collection or mass lesion/mass effect. Vascular: No hyperdense vessel or unexpected calcification. Skull: Normal. Negative for fracture or focal  lesion. Sinuses/Orbits: No acute finding. Other: None. IMPRESSION: Normal head CT. Electronically Signed   By: Julian Hy M.D.   On: 10/27/2016 09:00   Nm Myocar Multi W/spect W/wall Motion / Ef  Result Date: 10/29/2016  Defect 1: There is a small defect of mild severity present in the basal inferior, basal inferolateral, mid inferior, mid inferolateral and apical lateral location. While this is likely due to soft tissue attenuation artifact, a mild degree of scar cannot entirely be ruled out. No ischemic zones.  This is a low risk study.  Nuclear stress EF: 56%.     Lab Results: Basic Metabolic Panel:  Recent Labs  11/18/16 0455 11/19/16 0530  NA 141 142  K 4.2 4.2  CL 113* 112*  CO2 19* 22  GLUCOSE 94 110*  BUN 57* 52*  CREATININE 4.84* 4.58*  CALCIUM 8.8* 9.3  PHOS 3.7 3.1   Liver Function Tests:  Recent Labs  11/18/16 0455 11/19/16 0530  ALBUMIN 3.4* 3.5     CBC:  Recent Labs  11/17/16 0519  WBC 4.3  HGB 10.7*  HCT 32.8*  MCV 87.0  PLT 142*    No results found for this or any previous visit (from the past 240 hour(s)).   Hospital Course: This is a 51 year old who came to the emergency department with not feeling well. She was found to be in acute renal failure. She was treated with IV fluids given diuretics and slowly improved. By the time of discharge her creatinine was around 4 and she is approaching dialysis. She is going to be sent for fistula creation as an outpatient. She feels better not having any uremic symptoms and wants to go home.  Discharge Exam: Blood pressure (!) 175/95, pulse 92, temperature 97.9 F (36.6 C), temperature source Oral, resp. rate 18, height 5\' 7"  (1.702 m), weight 120.7 kg (266 lb), SpO2 96 %. She is awake and alert. Chest is clear. Heart is regular.  Disposition: Home she will follow in my office and with nephrology and be set up for fistula creation  Discharge Instructions    Diet - low sodium heart healthy     Complete by:  As directed    Increase activity slowly    Complete by:  As directed       Follow-up Information    [facility] S, MD Follow up in 1 week(s).   Specialty:  Nephrology Contact information: [address] [place] [address] [phone]           Signed: Ariane Ditullio L   11/19/2016, 9:05 AM

## 2016-11-19 NOTE — Progress Notes (Signed)
Patient oriented to where she is, but stating there are three people knocking on her window. IV found on floor. Patient states that IV came out when she was up, but patient has been in bed. Even with confusion, patient remembered conversation she had with this RN yesterday. Short time later, patient was yelling out. Patient states that she saw two women trying to be with her boyfriend. When light was turned on, she asked where the women were. At start of shift assessment, patient asked this RN for discharge papers. Patient notified of time and that patient is not discharged tonight, maybe tomorrow. Patient stated that she felt like she slept for a long time. Patient A&O x4 after that statement until now. Will continue to monitor.

## 2016-11-19 NOTE — Progress Notes (Signed)
Martha [name]  MRN: [id number]  DOB/AGE: [birth year] 51 y.o.  Primary Care Physician:HAWKINS,EDWARD L, MD  Admit date: 11/14/2016  Chief Complaint:  Chief Complaint  Patient presents with  . Headache    x 2 days    S-Pt presented on  11/14/2016 with  Chief Complaint  Patient presents with  . Headache    x 2 days  .    Pt offers no new complaints.    Meds  . albuterol  2.5 mg Inhalation TID  . ALPRAZolam  1 mg Oral QID  . atorvastatin  30 mg Oral Daily  . carvedilol  12.5 mg Oral BID WC  . colchicine  0.6 mg Oral Q48H  . fluticasone  2 spray Each Nare QHS  . heparin  5,000 Units Subcutaneous Q8H  . hydrALAZINE  25 mg Oral Q8H  . PARoxetine  20 mg Oral Daily  . rOPINIRole  1 mg Oral QHS  . torsemide  40 mg Oral Daily          Physical Exam: Vital signs in last 24 hours: Temp:  [97.9 F (36.6 C)-99.7 F (37.6 C)] 97.9 F (36.6 C) (04/25 9323) Pulse Rate:  [72-92] 92 (04/25 0633) Resp:  [16-18] 18 (04/25 5573) BP: (133-175)/(69-95) 175/95 (04/25 0633) SpO2:  [94 %-98 %] 96 % (04/25 0937) Weight change:  Last BM Date: 11/19/16  Intake/Output from previous day: 04/24 0701 - 04/25 0700 In: 360 [P.O.:360] Out: 400 [Urine:400] Total I/O In: 120 [P.O.:120] Out: -    Physical Exam: General- pt is awake,alert, oriented to time place and person Resp- No acute REsp distress, CTA B/L NO Rhonchi CVS- S1S2 regular in rate and rhythm GIT- BS+, soft, NT, ND EXT- NO LE Edema, No Cyanosis   Lab Results: CBC  Recent Labs  11/17/16 0519  WBC 4.3  HGB 10.7*  HCT 32.8*  PLT 142*    BMET  Recent Labs  11/18/16 0455 11/19/16 0530  NA 141 142  K 4.2 4.2  CL 113* 112*  CO2 19* 22  GLUCOSE 94 110*  BUN 57* 52*  CREATININE 4.84* 4.58*  CALCIUM 8.8* 9.3   Creat trend 2018  6.7=>4.8=>4.5         4---5.2  2017  2.3--4.7 2016 1.9--2.2 2015 1.3--3.4   Lab Results  Component Value Date   PTH 61 11/16/2016   PTH Comment 11/16/2016   CALCIUM 9.3  11/19/2016   CAION 1.15 06/21/2015   PHOS 3.1 11/19/2016               Impression: 1)Renal  AKI secondary to ATN               AKI on CKD               CKD stage 4/5.               CKD since 2015               CKD secondary to  DM                Progression of CKD marked with multiple episodes of AKI                Proteinura present.                AKI better               Creat trending down  2)HTN  Medication-  On Diuretics- On Alpha and beta Blockers  On Vasodilators-    3)Anemia HGb stable  4)CKD Mineral-Bone Disorder PTH acceptable  Secondary Hyperparathyroidism absent. Phosphorus at goal.   5)DM PMD following  6)Electrolytes Normokalemic NOrmonatremic   7)Acid base Co2 now at goal Acidosis now better    Plan:  Will continue current care. Will see pt as outpt id d/ced.      BHUTANI,MANPREET S 11/19/2016, 10:15 AM
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
## 2016-11-19 NOTE — Progress Notes (Signed)
Pt discharged home today per Dr. Hawkins. Pt's IV site D/C'd and WDL. Pt's VSS. Pt provided with home medication list, discharge instructions and prescriptions. Verbalized understanding. Pt left floor via WC in stable condition accompanied by NT. 

## 2016-11-19 NOTE — Progress Notes (Signed)
Patient was yelling out. Patient told this RN that she couldn't get up. Stated that she was in the chair and trying to get into bed. Patient was already in bed. Patient stated that she forgot that she was in the hospital. Will continue to monitor.

## 2016-11-19 NOTE — Progress Notes (Signed)
Subjective: She feels better. She has been off IV fluids and is on oral diuretics now. Her renal function continues to improve. She had some confusion last night but is back at baseline this morning. No other new complaints.  Objective: Vital signs in last 24 hours: Temp:  [97.9 F (36.6 C)-99.7 F (37.6 C)] 97.9 F (36.6 C) (04/25 0633) Pulse Rate:  [72-92] 92 (04/25 0633) Resp:  [16-18] 18 (04/25 0633) BP: (133-175)/(69-95) 175/95 (04/25 0633) SpO2:  [93 %-98 %] 96 % (04/25 0633) Weight change:  Last BM Date: 11/18/16  Intake/Output from previous day: 04/24 0701 - 04/25 0700 In: 360 [P.O.:360] Out: 400 [Urine:400]  PHYSICAL EXAM General appearance: alert, cooperative and no distress Resp: clear to auscultation bilaterally Cardio: regular rate and rhythm, S1, S2 normal, no murmur, click, rub or gallop GI: soft, non-tender; bowel sounds normal; no masses,  no organomegaly Extremities: extremities normal, atraumatic, no cyanosis or edema Skin warm and dry  Lab Results:  Results for orders placed or performed during the hospital encounter of 11/14/16 (from the past 48 hour(s))  Glucose, capillary     Status: Abnormal   Collection Time: 11/17/16 11:15 AM  Result Value Ref Range   Glucose-Capillary 129 (H) 65 - 99 mg/dL  Glucose, capillary     Status: Abnormal   Collection Time: 11/17/16  4:03 PM  Result Value Ref Range   Glucose-Capillary 102 (H) 65 - 99 mg/dL   Comment 1 Notify RN    Comment 2 Document in Chart   Glucose, capillary     Status: Abnormal   Collection Time: 11/17/16  9:17 PM  Result Value Ref Range   Glucose-Capillary 126 (H) 65 - 99 mg/dL   Comment 1 Notify RN    Comment 2 Document in Chart   Renal function panel     Status: Abnormal   Collection Time: 11/18/16  4:55 AM  Result Value Ref Range   Sodium 141 135 - 145 mmol/L   Potassium 4.2 3.5 - 5.1 mmol/L   Chloride 113 (H) 101 - 111 mmol/L   CO2 19 (L) 22 - 32 mmol/L   Glucose, Bld 94 65 - 99 mg/dL    BUN 57 (H) 6 - 20 mg/dL   Creatinine, Ser 4.84 (H) 0.44 - 1.00 mg/dL   Calcium 8.8 (L) 8.9 - 10.3 mg/dL   Phosphorus 3.7 2.5 - 4.6 mg/dL   Albumin 3.4 (L) 3.5 - 5.0 g/dL   GFR calc non Af Amer 10 (L) >60 mL/min   GFR calc Af Amer 11 (L) >60 mL/min    Comment: (NOTE) The eGFR has been calculated using the CKD EPI equation. This calculation has not been validated in all clinical situations. eGFR's persistently <60 mL/min signify possible Chronic Kidney Disease.    Anion gap 9 5 - 15  Glucose, capillary     Status: None   Collection Time: 11/18/16  7:57 AM  Result Value Ref Range   Glucose-Capillary 94 65 - 99 mg/dL   Comment 1 Notify RN    Comment 2 Document in Chart   Glucose, capillary     Status: Abnormal   Collection Time: 11/18/16 11:29 AM  Result Value Ref Range   Glucose-Capillary 127 (H) 65 - 99 mg/dL  Glucose, capillary     Status: Abnormal   Collection Time: 11/18/16  4:35 PM  Result Value Ref Range   Glucose-Capillary 115 (H) 65 - 99 mg/dL   Comment 1 Notify RN    Comment 2   Document in Chart   Glucose, capillary     Status: Abnormal   Collection Time: 11/18/16  9:35 PM  Result Value Ref Range   Glucose-Capillary 119 (H) 65 - 99 mg/dL   Comment 1 Notify RN    Comment 2 Document in Chart   Renal function panel     Status: Abnormal   Collection Time: 11/19/16  5:30 AM  Result Value Ref Range   Sodium 142 135 - 145 mmol/L   Potassium 4.2 3.5 - 5.1 mmol/L   Chloride 112 (H) 101 - 111 mmol/L   CO2 22 22 - 32 mmol/L   Glucose, Bld 110 (H) 65 - 99 mg/dL   BUN 52 (H) 6 - 20 mg/dL   Creatinine, Ser 4.58 (H) 0.44 - 1.00 mg/dL   Calcium 9.3 8.9 - 10.3 mg/dL   Phosphorus 3.1 2.5 - 4.6 mg/dL   Albumin 3.5 3.5 - 5.0 g/dL   GFR calc non Af Amer 10 (L) >60 mL/min   GFR calc Af Amer 12 (L) >60 mL/min    Comment: (NOTE) The eGFR has been calculated using the CKD EPI equation. This calculation has not been validated in all clinical situations. eGFR's persistently <60  mL/min signify possible Chronic Kidney Disease.    Anion gap 8 5 - 15    ABGS No results for input(s): PHART, PO2ART, TCO2, HCO3 in the last 72 hours.  Invalid input(s): PCO2 CULTURES No results found for this or any previous visit (from the past 240 hour(s)). Studies/Results: No results found.  Medications:  Prior to Admission:  Prescriptions Prior to Admission  Medication Sig Dispense Refill Last Dose  . albuterol (PROVENTIL HFA;VENTOLIN HFA) 108 (90 Base) MCG/ACT inhaler Inhale 2 puffs into the lungs every 4 (four) hours as needed for wheezing or shortness of breath. 1 Inhaler 0 11/14/2016 at Unknown time  . albuterol (PROVENTIL) (2.5 MG/3ML) 0.083% nebulizer solution Inhale 3 mLs (2.5 mg total) into the lungs 3 (three) times daily. 75 mL 12 11/14/2016 at Unknown time  . ALPRAZolam (XANAX) 1 MG tablet Take 1 mg by mouth 4 (four) times daily.     11/14/2016 at Unknown time  . atorvastatin (LIPITOR) 10 MG tablet Take 10 mg by mouth daily. Take along with 20 mg tablet to equal 30 mg daily.   11/14/2016 at Unknown time  . atorvastatin (LIPITOR) 20 MG tablet Take 20 mg by mouth daily. Take along with 10 mg tablet to equal 30 mg daily.   11/14/2016 at Unknown time  . baclofen (LIORESAL) 20 MG tablet Take 20 mg by mouth 3 (three) times daily as needed for muscle spasms.   11/14/2016 at Unknown time  . carvedilol (COREG) 12.5 MG tablet Take 1 tablet (12.5 mg total) by mouth 2 (two) times daily with a meal. 60 tablet 0 11/14/2016 at [place]  . colchicine 0.6 MG tablet Take 1 tablet (0.6 mg total) by mouth daily. 60 tablet 1 11/14/2016 at Unknown time  . fluticasone (FLONASE) 50 MCG/ACT nasal spray Place 2 sprays into both nostrils at bedtime.   11/13/2016 at Unknown time  . hydrALAZINE (APRESOLINE) 25 MG tablet Take 1 tablet (25 mg total) by mouth every 8 (eight) hours. 90 tablet 12 11/14/2016 at Unknown time  . HYDROcodone-acetaminophen (NORCO/VICODIN) 5-325 MG tablet Take 1 tablet by mouth every 4 (four)  hours as needed for moderate pain.   11/14/2016 at Unknown time  . lisinopril-hydrochlorothiazide (PRINZIDE,ZESTORETIC) 20-12.5 MG tablet Take 1 tablet by mouth daily.   11/14/2016  at Unknown time  . metoCLOPramide (REGLAN) 10 MG tablet Take 1 tablet (10 mg total) by mouth every 6 (six) hours as needed for nausea (or headache). 30 tablet 0 11/14/2016 at Unknown time  . PARoxetine (PAXIL) 20 MG tablet Take 1 daily. This is to prevent panic attacks (Patient taking differently: Take 20 mg by mouth daily. Take 1 daily. This is to prevent panic attacks) 30 tablet 12 11/14/2016 at Unknown time  . polyethylene glycol (MIRALAX / GLYCOLAX) packet Take 17 g by mouth daily. 14 each 0 11/14/2016 at Unknown time  . rOPINIRole (REQUIP) 1 MG tablet Take 1 mg by mouth at bedtime.    11/13/2016 at Unknown time  . furosemide (LASIX) 20 MG tablet Take 20 mg by mouth daily as needed for edema.   unknown  . loperamide (IMODIUM) 2 MG capsule Take 1 capsule (2 mg total) by mouth 4 (four) times daily as needed for diarrhea or loose stools. 12 capsule 0 10/26/2016 at Unknown time   Scheduled: . albuterol  2.5 mg Inhalation TID  . ALPRAZolam  1 mg Oral QID  . atorvastatin  30 mg Oral Daily  . carvedilol  12.5 mg Oral BID WC  . colchicine  0.6 mg Oral Q48H  . fluticasone  2 spray Each Nare QHS  . heparin  5,000 Units Subcutaneous Q8H  . hydrALAZINE  25 mg Oral Q8H  . PARoxetine  20 mg Oral Daily  . rOPINIRole  1 mg Oral QHS  . torsemide  40 mg Oral Daily   Continuous:  PRN:albuterol, alum & mag hydroxide-simeth, baclofen, hydrALAZINE, HYDROcodone-acetaminophen, loperamide, metoCLOPramide  Assesment: She was admitted with acute on chronic renal failure. She is better. She is approaching dialysis. She has hypertension at baseline and that's pre-well controlled. She has diabetes which has not been well controlled for some time but she is now seeing an endocrinologist. She has sleep apnea which is stable Principal Problem:    AKI (acute kidney injury) ([facility]) Active Problems:   Essential hypertension   Sleep apnea   Diabetes mellitus, type 2 ([facility])   CKD stage 5 secondary to hypertension ([facility])    Plan: Discharge home today    LOS: 4 days   , L 11/19/2016, 9:00 AM  

## 2016-11-20 ENCOUNTER — Inpatient Hospital Stay (HOSPITAL_COMMUNITY)
Admission: EM | Admit: 2016-11-20 | Discharge: 2016-12-01 | DRG: 304 | Disposition: A | Discharge status: Skilled Nursing Facility | Payer: Medicaid Other | Attending: Pulmonary Disease | Admitting: Pulmonary Disease

## 2016-11-20 DIAGNOSIS — G2581 Restless legs syndrome: Secondary | ICD-10-CM | POA: Diagnosis present

## 2016-11-20 DIAGNOSIS — I120 Hypertensive chronic kidney disease with stage 5 chronic kidney disease or end stage renal disease: Secondary | ICD-10-CM | POA: Diagnosis present

## 2016-11-20 DIAGNOSIS — I160 Hypertensive urgency: Principal | ICD-10-CM | POA: Diagnosis present

## 2016-11-20 DIAGNOSIS — Z885 Allergy status to narcotic agent status: Secondary | ICD-10-CM

## 2016-11-20 DIAGNOSIS — I10 Essential (primary) hypertension: Secondary | ICD-10-CM | POA: Diagnosis present

## 2016-11-20 DIAGNOSIS — F319 Bipolar disorder, unspecified: Secondary | ICD-10-CM | POA: Diagnosis present

## 2016-11-20 DIAGNOSIS — E1122 Type 2 diabetes mellitus with diabetic chronic kidney disease: Secondary | ICD-10-CM | POA: Diagnosis present

## 2016-11-20 DIAGNOSIS — N185 Chronic kidney disease, stage 5: Secondary | ICD-10-CM | POA: Diagnosis present

## 2016-11-20 DIAGNOSIS — M5412 Radiculopathy, cervical region: Secondary | ICD-10-CM | POA: Diagnosis present

## 2016-11-20 DIAGNOSIS — E669 Obesity, unspecified: Secondary | ICD-10-CM | POA: Diagnosis present

## 2016-11-20 DIAGNOSIS — Z87891 Personal history of nicotine dependence: Secondary | ICD-10-CM

## 2016-11-20 DIAGNOSIS — Z91148 Patient's other noncompliance with medication regimen for other reason: Secondary | ICD-10-CM

## 2016-11-20 DIAGNOSIS — Z79890 Hormone replacement therapy (postmenopausal): Secondary | ICD-10-CM

## 2016-11-20 DIAGNOSIS — Z6839 Body mass index (BMI) 39.0-39.9, adult: Secondary | ICD-10-CM

## 2016-11-20 DIAGNOSIS — F23 Brief psychotic disorder: Secondary | ICD-10-CM | POA: Diagnosis present

## 2016-11-20 DIAGNOSIS — K219 Gastro-esophageal reflux disease without esophagitis: Secondary | ICD-10-CM | POA: Diagnosis present

## 2016-11-20 DIAGNOSIS — R482 Apraxia: Secondary | ICD-10-CM | POA: Diagnosis present

## 2016-11-20 DIAGNOSIS — E43 Unspecified severe protein-calorie malnutrition: Secondary | ICD-10-CM | POA: Diagnosis present

## 2016-11-20 DIAGNOSIS — E876 Hypokalemia: Secondary | ICD-10-CM | POA: Diagnosis present

## 2016-11-20 DIAGNOSIS — E860 Dehydration: Secondary | ICD-10-CM | POA: Diagnosis present

## 2016-11-20 DIAGNOSIS — M898X9 Other specified disorders of bone, unspecified site: Secondary | ICD-10-CM | POA: Diagnosis present

## 2016-11-20 DIAGNOSIS — D631 Anemia in chronic kidney disease: Secondary | ICD-10-CM | POA: Diagnosis present

## 2016-11-20 DIAGNOSIS — F410 Panic disorder [episodic paroxysmal anxiety] without agoraphobia: Secondary | ICD-10-CM | POA: Diagnosis present

## 2016-11-20 DIAGNOSIS — Z9114 Patient's other noncompliance with medication regimen: Secondary | ICD-10-CM

## 2016-11-20 DIAGNOSIS — J449 Chronic obstructive pulmonary disease, unspecified: Secondary | ICD-10-CM | POA: Diagnosis present

## 2016-11-20 DIAGNOSIS — G4733 Obstructive sleep apnea (adult) (pediatric): Secondary | ICD-10-CM | POA: Diagnosis present

## 2016-11-20 DIAGNOSIS — G4710 Hypersomnia, unspecified: Secondary | ICD-10-CM | POA: Diagnosis present

## 2016-11-20 DIAGNOSIS — E870 Hyperosmolality and hypernatremia: Secondary | ICD-10-CM | POA: Diagnosis present

## 2016-11-20 DIAGNOSIS — R410 Disorientation, unspecified: Secondary | ICD-10-CM

## 2016-11-20 DIAGNOSIS — G92 Toxic encephalopathy: Secondary | ICD-10-CM | POA: Diagnosis present

## 2016-11-20 LAB — URINALYSIS, ROUTINE W REFLEX MICROSCOPIC
Bilirubin Urine: NEGATIVE
GLUCOSE, UA: NEGATIVE mg/dL
Ketones, ur: NEGATIVE mg/dL
LEUKOCYTES UA: NEGATIVE
NITRITE: NEGATIVE
Protein, ur: >=300 mg/dL — AB
RBC / HPF: NONE SEEN RBC/hpf (ref 0–5)
SPECIFIC GRAVITY, URINE: 1.012 (ref 1.005–1.030)
pH: 5 (ref 5.0–8.0)

## 2016-11-20 LAB — CBC WITH DIFFERENTIAL/PLATELET
BASOS ABS: 0 10*3/uL (ref 0.0–0.1)
BASOS PCT: 1 %
EOS ABS: 0.2 10*3/uL (ref 0.0–0.7)
EOS PCT: 3 %
HCT: 33.3 % — ABNORMAL LOW (ref 36.0–46.0)
HEMOGLOBIN: 10.7 g/dL — AB (ref 12.0–15.0)
Lymphocytes Relative: 37 %
Lymphs Abs: 1.7 10*3/uL (ref 0.7–4.0)
MCH: 28.1 pg (ref 26.0–34.0)
MCHC: 32.1 g/dL (ref 30.0–36.0)
MCV: 87.4 fL (ref 78.0–100.0)
Monocytes Absolute: 0.4 10*3/uL (ref 0.1–1.0)
Monocytes Relative: 9 %
Neutro Abs: 2.3 10*3/uL (ref 1.7–7.7)
Neutrophils Relative %: 50 %
PLATELETS: 145 10*3/uL — AB (ref 150–400)
RBC: 3.81 MIL/uL — AB (ref 3.87–5.11)
RDW: 15.1 % (ref 11.5–15.5)
WBC: 4.7 10*3/uL (ref 4.0–10.5)

## 2016-11-20 LAB — COMPREHENSIVE METABOLIC PANEL
ALBUMIN: 3.9 g/dL (ref 3.5–5.0)
ALK PHOS: 70 U/L (ref 38–126)
ALT: 49 U/L (ref 14–54)
AST: 42 U/L — ABNORMAL HIGH (ref 15–41)
Anion gap: 8 (ref 5–15)
BUN: 50 mg/dL — ABNORMAL HIGH (ref 6–20)
CALCIUM: 10.8 mg/dL — AB (ref 8.9–10.3)
CHLORIDE: 109 mmol/L (ref 101–111)
CO2: 23 mmol/L (ref 22–32)
CREATININE: 4.65 mg/dL — AB (ref 0.44–1.00)
GFR calc Af Amer: 12 mL/min — ABNORMAL LOW (ref >60)
GFR calc non Af Amer: 10 mL/min — ABNORMAL LOW (ref >60)
Glucose, Bld: 116 mg/dL — ABNORMAL HIGH (ref 65–99)
Potassium: 4.2 mmol/L (ref 3.5–5.1)
SODIUM: 140 mmol/L (ref 135–145)
Total Bilirubin: 0.5 mg/dL (ref 0.3–1.2)
Total Protein: 6.8 g/dL (ref 6.5–8.1)

## 2016-11-20 LAB — AMMONIA: Ammonia: 22 umol/L (ref 9–35)

## 2016-11-20 LAB — TSH: TSH: 2.64 u[IU]/mL (ref 0.350–4.500)

## 2016-11-20 MED ORDER — FLUTICASONE PROPIONATE 50 MCG/ACT NA SUSP
2.0000 | Freq: Every day | NASAL | Status: DC
  Administered 2016-11-21 – 2016-11-30 (×9): 2 via NASAL
  Filled 2016-11-20 (×4): qty 16

## 2016-11-20 MED ORDER — HYDRALAZINE HCL 25 MG PO TABS
25.0000 mg | ORAL_TABLET | Freq: Three times a day (TID) | ORAL | Status: DC
  Administered 2016-11-21 – 2016-12-01 (×30): 25 mg via ORAL
  Filled 2016-11-20 (×31): qty 1

## 2016-11-20 MED ORDER — HEPARIN SODIUM (PORCINE) 5000 UNIT/ML IJ SOLN
5000.0000 [IU] | Freq: Three times a day (TID) | INTRAMUSCULAR | Status: DC
  Administered 2016-11-21 – 2016-12-01 (×29): 5000 [IU] via SUBCUTANEOUS
  Filled 2016-11-20 (×30): qty 1

## 2016-11-20 MED ORDER — SODIUM CHLORIDE 0.9 % IV BOLUS (SEPSIS)
1000.0000 mL | Freq: Once | INTRAVENOUS | Status: AC
  Administered 2016-11-20: 1000 mL via INTRAVENOUS

## 2016-11-20 MED ORDER — SODIUM CHLORIDE 0.9% FLUSH
3.0000 mL | Freq: Two times a day (BID) | INTRAVENOUS | Status: DC
  Administered 2016-11-21 – 2016-12-01 (×15): 3 mL via INTRAVENOUS

## 2016-11-20 MED ORDER — ESTRADIOL 1 MG PO TABS
0.5000 mg | ORAL_TABLET | Freq: Every day | ORAL | Status: DC
  Administered 2016-11-21 – 2016-12-01 (×11): 0.5 mg via ORAL
  Filled 2016-11-20 (×2): qty 1
  Filled 2016-11-20: qty 0.5
  Filled 2016-11-20 (×4): qty 1
  Filled 2016-11-20 (×2): qty 0.5
  Filled 2016-11-20 (×4): qty 1
  Filled 2016-11-20: qty 0.5

## 2016-11-20 MED ORDER — NICARDIPINE HCL IN NACL 20-0.86 MG/200ML-% IV SOLN
3.0000 mg/h | Freq: Once | INTRAVENOUS | Status: AC
  Administered 2016-11-20: 3 mg/h via INTRAVENOUS
  Filled 2016-11-20: qty 200

## 2016-11-20 MED ORDER — ATORVASTATIN CALCIUM 10 MG PO TABS
10.0000 mg | ORAL_TABLET | Freq: Every day | ORAL | Status: DC
  Administered 2016-11-21 – 2016-12-01 (×11): 10 mg via ORAL
  Filled 2016-11-20 (×12): qty 1

## 2016-11-20 MED ORDER — LISINOPRIL-HYDROCHLOROTHIAZIDE 20-12.5 MG PO TABS: 1.0000 | ORAL_TABLET | Freq: Every day | ORAL | Status: DC

## 2016-11-20 MED ORDER — ATORVASTATIN CALCIUM 20 MG PO TABS
20.0000 mg | ORAL_TABLET | Freq: Every day | ORAL | Status: DC
  Administered 2016-11-21 – 2016-12-01 (×11): 20 mg via ORAL
  Filled 2016-11-20 (×4): qty 1
  Filled 2016-11-20: qty 2
  Filled 2016-11-20 (×6): qty 1

## 2016-11-20 MED ORDER — ALPRAZOLAM 1 MG PO TABS
1.0000 mg | ORAL_TABLET | Freq: Four times a day (QID) | ORAL | Status: DC
  Administered 2016-11-21 – 2016-11-23 (×9): 1 mg via ORAL
  Filled 2016-11-20 (×9): qty 1

## 2016-11-20 MED ORDER — TORSEMIDE 20 MG PO TABS
40.0000 mg | ORAL_TABLET | Freq: Every day | ORAL | Status: DC
  Administered 2016-11-21 – 2016-12-01 (×11): 40 mg via ORAL
  Filled 2016-11-20 (×11): qty 2

## 2016-11-20 MED ORDER — CLONIDINE HCL 0.1 MG PO TABS
0.1000 mg | ORAL_TABLET | Freq: Once | ORAL | Status: AC
  Administered 2016-11-20: 0.1 mg via ORAL
  Filled 2016-11-20: qty 1

## 2016-11-20 MED ORDER — POLYETHYLENE GLYCOL 3350 17 G PO PACK
17.0000 g | PACK | Freq: Every day | ORAL | Status: DC
  Administered 2016-11-21 – 2016-12-01 (×11): 17 g via ORAL
  Filled 2016-11-20 (×11): qty 1

## 2016-11-20 MED ORDER — PAROXETINE HCL 20 MG PO TABS
40.0000 mg | ORAL_TABLET | Freq: Every day | ORAL | Status: DC
  Administered 2016-11-21 – 2016-11-25 (×5): 40 mg via ORAL
  Filled 2016-11-20 (×5): qty 2

## 2016-11-20 MED ORDER — ALBUTEROL SULFATE (2.5 MG/3ML) 0.083% IN NEBU: 3.0000 mL | INHALATION_SOLUTION | RESPIRATORY_TRACT | Status: DC | PRN

## 2016-11-20 MED ORDER — LOPERAMIDE HCL 2 MG PO CAPS: 2.0000 mg | ORAL_CAPSULE | Freq: Four times a day (QID) | ORAL | Status: DC | PRN

## 2016-11-20 MED ORDER — ALBUTEROL SULFATE (2.5 MG/3ML) 0.083% IN NEBU
2.5000 mg | INHALATION_SOLUTION | Freq: Three times a day (TID) | RESPIRATORY_TRACT | Status: DC
  Administered 2016-11-21 – 2016-12-01 (×31): 2.5 mg via RESPIRATORY_TRACT
  Filled 2016-11-20 (×33): qty 3

## 2016-11-20 MED ORDER — METOCLOPRAMIDE HCL 10 MG PO TABS: 10.0000 mg | ORAL_TABLET | Freq: Four times a day (QID) | ORAL | Status: DC | PRN

## 2016-11-20 MED ORDER — COLCHICINE 0.6 MG PO TABS
0.6000 mg | ORAL_TABLET | ORAL | Status: DC
  Administered 2016-11-21 – 2016-12-01 (×6): 0.6 mg via ORAL
  Filled 2016-11-20 (×11): qty 1

## 2016-11-20 MED ORDER — LABETALOL HCL 5 MG/ML IV SOLN
10.0000 mg | INTRAVENOUS | Status: DC | PRN
  Administered 2016-11-26: 10 mg via INTRAVENOUS
  Filled 2016-11-20: qty 4

## 2016-11-20 MED ORDER — CARVEDILOL 12.5 MG PO TABS
12.5000 mg | ORAL_TABLET | Freq: Two times a day (BID) | ORAL | Status: DC
  Administered 2016-11-21 – 2016-12-01 (×21): 12.5 mg via ORAL
  Filled 2016-11-20 (×22): qty 1

## 2016-11-20 NOTE — H&P (Signed)
History and Physical    Martha [name] WCB:[id number] DOB: [birth year] DOA: 11/20/2016  PCP: Alonza Bogus, MD  Patient coming from: Home.    Chief Complaint: Not feeling well.  Seeing things.   HPI: Martha [name] is an 51 y.o. female with hx of [name] is a 51 y.o. female with medical history significant of CKD stage 5, COPD, HTN, DM, OSA, just discharged yesterday after several admission for AKI, given IVF with improved, returned to the ER with complaints of not feeling well, having visual hallucination intermittently.  Evaluation in the ER showed BP 210/115, and she was started on IV Cardene, with subsequent BP of 616 systolic.  Work up included creatinine at baseline, K ok, and no leukocytosis.  Hospitalist was asked to admit her for HTN urgency.  She has significant hx of psychiatric diagnosis as well, and TTS was consulted.     ED Course:  See above.  Rewiew of Systems:  Constitutional: Negative for malaise, fever and chills. No significant weight loss or weight gain Eyes: Negative for eye pain, redness and discharge, diplopia, visual changes, or flashes of light. ENMT: Negative for ear pain, hoarseness, nasal congestion, sinus pressure and sore throat. No headaches; tinnitus, drooling, or problem swallowing. Cardiovascular: Negative for chest pain, palpitations, diaphoresis, dyspnea and peripheral edema. ; No orthopnea, PND Respiratory: Negative for cough, hemoptysis, wheezing and stridor. No pleuritic chestpain. Gastrointestinal: Negative for diarrhea, constipation,  melena, blood in stool, hematemesis, jaundice and rectal bleeding.    Genitourinary: Negative for frequency, dysuria, incontinence,flank pain and hematuria; Musculoskeletal: Negative for back pain and neck pain. Negative for swelling and trauma.;  Skin: . Negative for pruritus, rash, abrasions, bruising and skin lesion.; ulcerations Neuro: Negative for headache, lightheadedness and neck stiffness. Negative for  weakness, altered level of consciousness , altered mental status, extremity weakness, burning feet, involuntary movement, seizure and syncope.  Psych: negative for anxiety, depression, insomnia, tearfulness, panic attacks,  paranoia, suicidal or homicidal ideation    Past Medical History:  Diagnosis Date  . Acid reflux   . Anemia   . Arthritis   . Bipolar 1 disorder ([place])   . Cervical radiculopathy   . COPD (chronic obstructive pulmonary disease) ([place]) 2014   bronchitis  . Degenerative disc disease, thoracic   . Depression   . Diabetes mellitus   . Hypertension   . Noncompliance with medication regimen   . Obesity (BMI 30-39.9)   . OSA (obstructive sleep apnea)    cpap  . Panic attack   . RLS (restless legs syndrome)     Past Surgical History:  Procedure Laterality Date  . BIOPSY N/A 05/17/2013   Procedure: BIOPSY;  Surgeon: [name], MD;  Location: [facility];  Service: Endoscopy;  Laterality: N/A;  . CHOLECYSTECTOMY N/A 04/27/2014   Procedure: LAPAROSCOPIC CHOLECYSTECTOMY;  Surgeon: [name], MD;  Location: [facility];  Service: General;  Laterality: N/A;  . COLONOSCOPY  06/06/2010   WVP:[id number] bleeding secondary to internal hemorrhoids but incomplete evaluation secondary to poor right colon prep/small rectal and sigmoid colon polyps (hyperplastic). PROPOFOL  . COLONOSCOPY  May 2013   Dr. Gilliam/[facility]: 5 mm a descending colon polyp, hyperplastic. Adequate bowel prep.  . ENDOMETRIAL ABLATION    . ESOPHAGOGASTRODUODENOSCOPY  06/06/2010   RSW:NIOEVOJ erythema and edema of body of stomach, with sessile polypoid lesions. bx benign. no h.pylori  . ESOPHAGOGASTRODUODENOSCOPY (EGD) WITH PROPOFOL N/A 05/17/2013   Dr. Oneida Alar: normal esophagus, moderate nodular gastritis, negative path, empiric Savary  dilation  . FLEXIBLE SIGMOIDOSCOPY N/A 05/17/2013   Dr. Oneida Alar: moderate sized internal hemorrhoids  . HEMORRHOID BANDING N/A 05/17/2013   Procedure: HEMORRHOID BANDING;   Surgeon: [name], MD;  Location: [facility];  Service: Endoscopy;  Laterality: N/A;  2 bands placed  . INCISION AND DRAINAGE ABSCESS Left 10/11/2013   Procedure: INCISION AND DRAINAGE AND DEBRIDEMENT LEFT BREAST  ABSCESS;  Surgeon: [name], MD;  Location: [facility];  Service: General;  Laterality: Left;  . IRRIGATION AND DEBRIDEMENT ABSCESS Right 06/01/2013   Procedure: INCISION AND DRAINAGE AND DEBRIDEMENT ABSCESS RIGHT BREAST;  Surgeon: [name], MD;  Location: [facility];  Service: General;  Laterality: Right;  . SAVORY DILATION N/A 05/17/2013   Procedure: SAVORY DILATION;  Surgeon: [name], MD;  Location: [facility];  Service: Endoscopy;  Laterality: N/A;  14/15/16  . TUBAL LIGATION       reports that she quit smoking about 9 years ago. Her smoking use included Cigarettes. She has a 37.50 pack-year smoking history. She has quit using smokeless tobacco. She reports that she does not drink alcohol or use drugs.  Allergies  Allergen Reactions  . Codeine Nausea And Vomiting    Family History  Problem Relation Age of Onset  . Adopted: Yes  . Family history unknown: Yes     Prior to Admission medications   Medication Sig Start Date End Date Taking? Authorizing Provider  albuterol (PROVENTIL HFA;VENTOLIN HFA) 108 (90 Base) MCG/ACT inhaler Inhale 2 puffs into the lungs every 4 (four) hours as needed for wheezing or shortness of breath. 07/23/16  Yes Francine Graven, DO  albuterol (PROVENTIL) (2.5 MG/3ML) 0.083% nebulizer solution Inhale 3 mLs (2.5 mg total) into the lungs 3 (three) times daily. 10/29/16  Yes Sinda Du, MD  ALPRAZolam Duanne Moron) 1 MG tablet Take 1 mg by mouth 4 (four) times daily.     Yes Historical Provider, MD  atorvastatin (LIPITOR) 20 MG tablet Take 20 mg by mouth daily. Take along with 10 mg tablet to equal 30 mg daily.   Yes Historical Provider, MD  baclofen (LIORESAL) 20 MG tablet Take 20 mg by mouth 3 (three) times daily as needed for muscle  spasms.   Yes Historical Provider, MD  carvedilol (COREG) 12.5 MG tablet Take 1 tablet (12.5 mg total) by mouth 2 (two) times daily with a meal. 05/26/16  Yes Cherene Altes, MD  colchicine 0.6 MG tablet Take 1 tablet (0.6 mg total) by mouth every other day. 11/19/16  Yes Sinda Du, MD  estradiol (ESTRACE) 0.5 MG tablet Take 0.5 mg by mouth daily.   Yes Historical Provider, MD  fluticasone (FLONASE) 50 MCG/ACT nasal spray Place 2 sprays into both nostrils at bedtime.   Yes Historical Provider, MD  hydrALAZINE (APRESOLINE) 25 MG tablet Take 1 tablet (25 mg total) by mouth every 8 (eight) hours. 10/29/16  Yes Sinda Du, MD  HYDROcodone-acetaminophen (NORCO) 10-325 MG tablet Take 1 tablet by mouth every 4 (four) hours as needed for moderate pain.   Yes Historical Provider, MD  lisinopril-hydrochlorothiazide (PRINZIDE,ZESTORETIC) 20-12.5 MG tablet Take 1 tablet by mouth daily.   Yes Historical Provider, MD  loperamide (IMODIUM) 2 MG capsule Take 1 capsule (2 mg total) by mouth 4 (four) times daily as needed for diarrhea or loose stools. 10/23/16  Yes Evalee Jefferson, PA-C  metoCLOPramide (REGLAN) 10 MG tablet Take 1 tablet (10 mg total) by mouth every 6 (six) hours as needed for nausea (or headache). 10/01/16  Yes Delora Fuel, MD  PARoxetine (PAXIL) 20 MG tablet Take 1 daily. This is to prevent panic attacks Patient taking differently: Take 40 mg by mouth daily. Take 1 daily. This is to prevent panic attacks 10/28/16  Yes Sinda Du, MD  polyethylene glycol [facility] / GLYCOLAX) packet Take 17 g by mouth daily. 10/30/16  Yes Sinda Du, MD  torsemide (DEMADEX) 20 MG tablet Take 2 tablets (40 mg total) by mouth daily. 11/19/16  Yes Sinda Du, MD  atorvastatin (LIPITOR) 10 MG tablet Take 10 mg by mouth daily. Take along with 20 mg tablet to equal 30 mg daily.    Historical Provider, MD  rOPINIRole (REQUIP) 1 MG tablet Take 1 mg by mouth at bedtime.     Historical Provider, MD    Physical  Exam: Vitals:   11/20/16 1800 11/20/16 2100 11/20/16 2130 11/20/16 2200  BP: (!) 197/108 (!) 194/104 140/87 120/82  Pulse:  86 86 85  Resp: 17 (!) 22 13 10   Temp:      TempSrc:      SpO2:  98% 99% 98%  Weight:      Height:          Constitutional: NAD, calm, comfortable Vitals:   11/20/16 1800 11/20/16 2100 11/20/16 2130 11/20/16 2200  BP: (!) 197/108 (!) 194/104 140/87 120/82  Pulse:  86 86 85  Resp: 17 (!) 22 13 10   Temp:      TempSrc:      SpO2:  98% 99% 98%  Weight:      Height:       Eyes: PERRL, lids and conjunctivae normal ENMT: Mucous membranes are moist. Posterior pharynx clear of any exudate or lesions.Normal dentition.  Neck: normal, supple, no masses, no thyromegaly Respiratory: clear to auscultation bilaterally, no wheezing, no crackles. Normal respiratory effort. No accessory muscle use.  Cardiovascular: Regular rate and rhythm, no murmurs / rubs / gallops. No extremity edema. 2+ pedal pulses. No carotid bruits.  Abdomen: no tenderness, no masses palpated. No hepatosplenomegaly. Bowel sounds positive.  Musculoskeletal: no clubbing / cyanosis. No joint deformity upper and lower extremities. Good ROM, no contractures. Normal muscle tone.  Skin: no rashes, lesions, ulcers. No induration Neurologic: CN 2-12 grossly intact. Sensation intact, DTR normal. Strength 5/5 in all 4.  Psychiatric: Normal judgment and insight. Alert and oriented x 3. Normal mood.     Labs on Admission: I have personally reviewed following labs and imaging studies  CBC:  Recent Labs Lab 11/15/16 0032 11/17/16 0519 11/20/16 1408  WBC 5.2 4.3 4.7  NEUTROABS 2.9  --  2.3  HGB 12.1 10.7* 10.7*  HCT 37.5 32.8* 33.3*  MCV 87.0 87.0 87.4  PLT 145* 142* 646*   Basic Metabolic Panel:  Recent Labs Lab 11/16/16 0310 11/16/16 1128 11/17/16 0519 11/18/16 0455 11/19/16 0530 11/20/16 1408  NA 143  --  142 141 142 140  K 4.5  --  4.3 4.2 4.2 4.2  CL 117*  --  113* 113* 112* 109   CO2 18*  --  20* 19* 22 23  GLUCOSE 89  --  97 94 110* 116*  BUN 73*  --  63* 57* 52* 50*  CREATININE 6.22*  --  5.19* 4.84* 4.58* 4.65*  CALCIUM 8.9 8.7 8.9 8.8* 9.3 10.8*  PHOS 5.6*  --  4.7* 3.7 3.1  --    GFR: Estimated Creatinine Clearance: 19.9 mL/min (A) (by C-G formula based on SCr of 4.65 mg/dL (H)). Liver Function Tests:  Recent Labs Lab 11/15/16 0032 11/16/16 0310 11/17/16 0519 11/18/16 0455 11/19/16 0530 11/20/16 1408  AST 14*  --   --   --   --  42*  ALT 21  --   --   --   --  49  ALKPHOS 72  --   --   --   --  70  BILITOT 0.5  --   --   --   --  0.5  PROT 7.0  --   --   --   --  6.8  ALBUMIN 3.9 3.4* 3.3* 3.4* 3.5 3.9    Recent Labs Lab 11/20/16 1408  AMMONIA 22   Cardiac Enzymes:  Recent Labs Lab 11/15/16 1549 11/15/16 2125 11/16/16 0310  TROPONINI <0.03 <0.03 <0.03   CBG:  Recent Labs Lab 11/18/16 1129 11/18/16 1635 11/18/16 2135 11/19/16 0805 11/19/16 1059  GLUCAP 127* 115* 119* 92 118*   Thyroid Function Tests:  Recent Labs  11/20/16 1408  TSH 2.640   Urine analysis:    Component Value Date/Time   COLORURINE YELLOW 11/20/2016 1415   APPEARANCEUR HAZY (A) 11/20/2016 1415   LABSPEC 1.012 11/20/2016 1415   PHURINE 5.0 11/20/2016 1415   GLUCOSEU NEGATIVE 11/20/2016 1415   HGBUR SMALL (A) 11/20/2016 1415   BILIRUBINUR NEGATIVE 11/20/2016 1415   KETONESUR NEGATIVE 11/20/2016 1415   PROTEINUR >=300 (A) 11/20/2016 1415   UROBILINOGEN 0.2 03/17/2015 2236   NITRITE NEGATIVE 11/20/2016 1415   LEUKOCYTESUR NEGATIVE 11/20/2016 1415   Assessment/Plan Principal Problem:   HTN (hypertension), malignant Active Problems:   Diabetes mellitus   Bipolar 1 disorder ([place])   Noncompliance with medication regimen   Panic attack    PLAN:   HTN urgency:  She doesn't require IV Cardene, so will d/c it.  Admit her OBS to telemetry and use Labetelol IV PRN for HTN.  Continue her home meds.  If she is not able to take care of herself at  home, she should consider STR.  DM: Will continue with carb modified diet.  CBGs are very controlled.   Bipolar illness:  Will reduce her narcotics.  She has some hallucinations.   DVT prophylaxis: SUBQ  Code Status: FULL CODE.  Family Communication: fiance with her permission.  Disposition Plan: TBD.  Consults called: None Admission status: OBS.    Elohim Brune MD FACP. [facility]  If 7PM-7AM, please contact night-coverage [email or web address] Password [facility]  11/20/2016, 10:51 PM

## 2016-11-20 NOTE — BH Assessment (Signed)
Jinny Blossom, NP recommends to refer back to medical. Patient is psychiatrically cleared.

## 2016-11-20 NOTE — ED Notes (Signed)
Patient's husband came out of room stating that patient was complaining of head numbness.  Assessed patient and she stated that her head was fine, but she had numbness in her brain. No neurological deficits noted.

## 2016-11-20 NOTE — BH Assessment (Signed)
Tele Assessment Note   Martha [name] is an 51 y.o. female presenting with extremity weakness, described as tingling and numbness in her hand and foot. The patient has several medical diagnoses currently and has been treated for Bipolar in the past. The patient and her husband report last Sunday the patient fell on her left side. Since then she has not been able to walk on her own, had decreased appetite and fluid intake. Husband reports the past 2 weeks, the patient was taking only "baby steps", and was unable to keep her balance.  Most recently she has auditory and visual halluncinations, thinking she sees her deceased aunt and her daughter who was not in the room at the time.  No prior history of hallucinations. Denies SI or HI. Denies alcohol or drug use. Report daily panic attacks. No previous history of SI attempts. The patient was oriented to place, thought the day was Saturday and hesitated on the year, but responded with the correct year, 2018.   She not currently being treated by a psychiatrist but states she was when her children where growing up.  Reports symptoms of feeling upset and having crying spells in the past. The patient is primarily treated by her PCP, Dr. Luan Pulling.   She is prescribed Vicodin and Xanax. She denies any overuse of medication.  The patient did have slurred and rambling speech, appeared psychomotor slowed, had pleasant mood, was cooperative, had partial judgment and insight. Reports feeling jittery saying, "I think I'm dehydrated." The patient was recently told she had low kidney function. The patient is also diagnosed with hypertension and diabetes.   Martha Blossom, NP recommends to refer back to medical. Patient is psychiatrically cleared.   Diagnosis: Bipolar I disorder  Past Medical History:  Past Medical History:  Diagnosis Date  . Acid reflux   . Anemia   . Arthritis   . Bipolar 1 disorder ([place])   . Cervical radiculopathy   . COPD (chronic obstructive  pulmonary disease) ([place]) 2014   bronchitis  . Degenerative disc disease, thoracic   . Depression   . Diabetes mellitus   . Hypertension   . Noncompliance with medication regimen   . Obesity (BMI 30-39.9)   . OSA (obstructive sleep apnea)    cpap  . Panic attack   . RLS (restless legs syndrome)     Past Surgical History:  Procedure Laterality Date  . BIOPSY N/A 05/17/2013   Procedure: BIOPSY;  Surgeon: [name], MD;  Location: [facility];  Service: Endoscopy;  Laterality: N/A;  . CHOLECYSTECTOMY N/A 04/27/2014   Procedure: LAPAROSCOPIC CHOLECYSTECTOMY;  Surgeon: [name], MD;  Location: [facility];  Service: General;  Laterality: N/A;  . COLONOSCOPY  06/06/2010   QZE:SPQZRA bleeding secondary to internal hemorrhoids but incomplete evaluation secondary to poor right colon prep/small rectal and sigmoid colon polyps (hyperplastic). PROPOFOL  . COLONOSCOPY  May 2013   Dr. Gilliam/[facility]: 5 mm a descending colon polyp, hyperplastic. Adequate bowel prep.  . ENDOMETRIAL ABLATION    . ESOPHAGOGASTRODUODENOSCOPY  06/06/2010   QTM:[id number] erythema and edema of body of stomach, with sessile polypoid lesions. bx benign. no h.pylori  . ESOPHAGOGASTRODUODENOSCOPY (EGD) WITH PROPOFOL N/A 05/17/2013   Dr. Oneida Alar: normal esophagus, moderate nodular gastritis, negative path, empiric Savary dilation  . FLEXIBLE SIGMOIDOSCOPY N/A 05/17/2013   Dr. Oneida Alar: moderate sized internal hemorrhoids  . HEMORRHOID BANDING N/A 05/17/2013   Procedure: HEMORRHOID BANDING;  Surgeon: [name], MD;  Location: [facility];  Service:  Endoscopy;  Laterality: N/A;  2 bands placed  . INCISION AND DRAINAGE ABSCESS Left 10/11/2013   Procedure: INCISION AND DRAINAGE AND DEBRIDEMENT LEFT BREAST  ABSCESS;  Surgeon: [name], MD;  Location: [facility];  Service: General;  Laterality: Left;  . IRRIGATION AND DEBRIDEMENT ABSCESS Right 06/01/2013   Procedure: INCISION AND DRAINAGE AND DEBRIDEMENT ABSCESS RIGHT BREAST;   Surgeon: [name], MD;  Location: [facility];  Service: General;  Laterality: Right;  . SAVORY DILATION N/A 05/17/2013   Procedure: SAVORY DILATION;  Surgeon: [name], MD;  Location: [facility];  Service: Endoscopy;  Laterality: N/A;  14/15/16  . TUBAL LIGATION      Family History:  Family History  Problem Relation Age of Onset  . Adopted: Yes  . Family history unknown: Yes    Social History:  reports that she quit smoking about 9 years ago. Her smoking use included Cigarettes. She has a 37.50 pack-year smoking history. She has quit using smokeless tobacco. She reports that she does not drink alcohol or use drugs.  Additional Social History:  Alcohol / Drug Use Pain Medications: see MAR Prescriptions: see MAR Over the Counter: see MAR History of alcohol / drug use?: No history of alcohol / drug abuse  CIWA: CIWA-Ar BP: (!) 153/90 Pulse Rate: 74 COWS:    PATIENT STRENGTHS: (choose at least two) Average or above average intelligence General fund of knowledge  Allergies:  Allergies  Allergen Reactions  . Codeine Nausea And Vomiting    Home Medications:  (Not in a hospital admission)  OB/GYN Status:  No LMP recorded. Patient has had an ablation.  General Assessment Data Location of Assessment: [facility] TTS Assessment: In system Is this a Tele or Face-to-Face Assessment?: Tele Assessment Is this an Initial Assessment or a Re-assessment for this encounter?: Initial Assessment Marital status: Married Is patient pregnant?: No Pregnancy Status: No Living Arrangements: Spouse/significant other Can pt return to current living arrangement?: Yes Admission Status: Voluntary Is patient capable of signing voluntary admission?: Yes Referral Source: Self/Family/Friend Insurance type: MCD  Medical Screening Exam ([place]) Medical Exam completed: Yes  Crisis Care Plan Living Arrangements: Spouse/significant other Name of Psychiatrist: n/a Name of Therapist:  n/a  Education Status Is patient currently in school?: No Highest grade of school patient has completed: some college  Risk to self with the past 6 months Suicidal Ideation: No Has patient been a risk to self within the past 6 months prior to admission? : No Suicidal Intent: No Has patient had any suicidal intent within the past 6 months prior to admission? : No Is patient at risk for suicide?: No Suicidal Plan?: No Has patient had any suicidal plan within the past 6 months prior to admission? : No Access to Means: No What has been your use of drugs/alcohol within the last 12 months?: n/a Previous Attempts/Gestures: No How many times?: 0 Other Self Harm Risks: 0 Intentional Self Injurious Behavior: None Family Suicide History: Unknown Recent stressful life event(s): Recent negative physical changes Persecutory voices/beliefs?: No Depression: No Substance abuse history and/or treatment for substance abuse?: No Suicide prevention information given to non-admitted patients: Not applicable  Risk to Others within the past 6 months Homicidal Ideation: No Does patient have any lifetime risk of violence toward others beyond the six months prior to admission? : No Thoughts of Harm to Others: No Current Homicidal Intent: No Current Homicidal Plan: No Access to Homicidal Means: No History of harm to others?: No  Assessment of Violence: None Noted Does patient have access to weapons?: No Criminal Charges Pending?: No Does patient have a court date: No Is patient on probation?: No  Psychosis Hallucinations: Auditory, Visual  Mental Status Report Appearance/Hygiene: Unremarkable Eye Contact: Poor Motor Activity: Psychomotor retardation Speech: Slurred Level of Consciousness: Alert Mood: Pleasant, Euthymic Affect: Flat Anxiety Level: Panic Attacks Panic attack frequency: x2 daily Most recent panic attack: unknown Thought Processes: Coherent, Relevant Judgement:  Partial Orientation: Person, Place, Situation Obsessive Compulsive Thoughts/Behaviors: None  Cognitive Functioning Concentration: Decreased Memory: Recent Intact, Remote Intact IQ: Average Insight: Fair Impulse Control: Fair Appetite: Poor Sleep: Decreased Vegetative Symptoms: Staying in bed, Decreased grooming  ADLScreening [facility] Assessment Services) Patient's cognitive ability adequate to safely complete daily activities?: Yes Patient able to express need for assistance with ADLs?: Yes Independently performs ADLs?: Yes (appropriate for developmental age)  Prior Inpatient Therapy Prior Inpatient Therapy: No  Prior Outpatient Therapy Prior Outpatient Therapy: No Does patient have an ACCT team?: No Does patient have Intensive In-House Services?  : No Does patient have [facility] services? : No Does patient have P4CC services?: No  ADL Screening (condition at time of admission) Patient's cognitive ability adequate to safely complete daily activities?: Yes Is the patient deaf or have difficulty hearing?: No Does the patient have difficulty seeing, even when wearing glasses/contacts?: No Does the patient have difficulty concentrating, remembering, or making decisions?: No Patient able to express need for assistance with ADLs?: Yes Does the patient have difficulty dressing or bathing?: No Independently performs ADLs?: Yes (appropriate for developmental age)             Regulatory affairs officer (For Healthcare) Does Patient Have a Medical Advance Directive?: Yes Type of Advance Directive: Healthcare Power of [place] in Chart?: No - copy requested Copy of Living Will in Chart?: No - copy requested    Additional Information 1:1 In Past 12 Months?: No CIRT Risk: No Elopement Risk: No Does patient have medical clearance?: Yes     Disposition:  Disposition Initial Assessment Completed for this Encounter: Yes Disposition of Patient:  Outpatient treatment Type of outpatient treatment: Adult  Mollie Germany 11/20/2016 4:37 PM

## 2016-11-20 NOTE — ED Notes (Signed)
Patient stated after neuro assessment, "I'm better now." This nurse asked patient to explain she said she feels better now.

## 2016-11-20 NOTE — ED Provider Notes (Addendum)
[facility] DEPT Provider Note   CSN: [id number] Arrival date & time: 11/20/16  1246   By signing my name below, I, [name], attest that this documentation has been prepared under the direction and in the presence of Nat Christen, MD. Electronically Signed: [name], Scribe. 11/20/16. 1:52 PM. History   Chief Complaint Chief Complaint  Patient presents with  . Weakness    The history is provided by the patient. No language interpreter was used.  HPI Comments: Martha [name] is a 51 y.o. female who presents to the Emergency Department complaining of generalized weakness since around 1 pm today. Patient was recently seen here on 11/14/2016 for the same problem. She was discharged yesterday. Patient states that she was laying down when she began to have tingling and numbness in the back of her head, bilat hands and feet.  She is also having some slurred speech and hallucinations per fiance. She is able to walk with assistance. Patient denies fever, nausea, vomiting, stiff neck.  Past Medical History:  Diagnosis Date  . Acid reflux   . Anemia   . Arthritis   . Bipolar 1 disorder ([place])   . Cervical radiculopathy   . COPD (chronic obstructive pulmonary disease) ([place]) 2014   bronchitis  . Degenerative disc disease, thoracic   . Depression   . Diabetes mellitus   . Hypertension   . Noncompliance with medication regimen   . Obesity (BMI 30-39.9)   . OSA (obstructive sleep apnea)    cpap  . Panic attack   . RLS (restless legs syndrome)     Patient Active Problem List   Diagnosis Date Noted  . CKD stage 5 secondary to hypertension ([place]) 11/15/2016  . AKI (acute kidney injury) ([place]) 11/15/2016  . Chest pain 10/27/2016  . CKD (chronic kidney disease) stage 5, GFR less than 15 ml/min (H[facility]) 10/27/2016  . Uncontrolled type 2 diabetes mellitus with hyperglycemia, without long-term current use of insulin ([place]) 10/27/2016  . Sensory disturbance 10/27/2016  . Left  hemiparesis ([place]) 10/27/2016  . Acute on chronic renal failure ([place]) 05/25/2016  . Abdominal pain 04/26/2014  . Cholelithiasis 04/26/2014  . Acute renal failure ([place]) 04/26/2014  . Dehydration 04/26/2014  . Hyponatremia 04/26/2014  . Gallstones 04/06/2014  . Uncontrolled diabetes mellitus ([place]) 10/09/2013  . Breast abscess 05/30/2013  . Diabetes mellitus, type 2 ([place]) 05/30/2013  . Hyperglycemia 05/30/2013  . Internal hemorrhoids with other complication 40/81/4481  . Umbilical hernia without mention of obstruction or gangrene 04/15/2013  . Anemia 04/15/2013  . DM 05/13/2010  . Hyperlipidemia 05/13/2010  . OBESITY 05/13/2010  . Depression 05/13/2010  . RESTLESS LEG SYNDROME 05/13/2010  . Essential hypertension 05/13/2010  . GERD 05/13/2010  . RECTAL BLEEDING 05/13/2010  . Sleep apnea 05/13/2010  . Dysphagia, oropharyngeal phase 05/13/2010    Past Surgical History:  Procedure Laterality Date  . BIOPSY N/A 05/17/2013   Procedure: BIOPSY;  Surgeon: [name], MD;  Location: [facility];  Service: Endoscopy;  Laterality: N/A;  . CHOLECYSTECTOMY N/A 04/27/2014   Procedure: LAPAROSCOPIC CHOLECYSTECTOMY;  Surgeon: [name], MD;  Location: [facility];  Service: General;  Laterality: N/A;  . COLONOSCOPY  06/06/2010   EHU:DJSHFW bleeding secondary to internal hemorrhoids but incomplete evaluation secondary to poor right colon prep/small rectal and sigmoid colon polyps (hyperplastic). PROPOFOL  . COLONOSCOPY  May 2013   Dr. Gilliam/[facility]: 5 mm a descending colon polyp, hyperplastic. Adequate bowel prep.  . ENDOMETRIAL ABLATION    . ESOPHAGOGASTRODUODENOSCOPY  06/06/2010   HAL:[id number] erythema and edema of body of stomach, with sessile polypoid lesions. bx benign. no h.pylori  . ESOPHAGOGASTRODUODENOSCOPY (EGD) WITH PROPOFOL N/A 05/17/2013   Dr. Oneida Alar: normal esophagus, moderate nodular gastritis, negative path, empiric Savary dilation  . FLEXIBLE SIGMOIDOSCOPY N/A 05/17/2013   Dr.  Oneida Alar: moderate sized internal hemorrhoids  . HEMORRHOID BANDING N/A 05/17/2013   Procedure: HEMORRHOID BANDING;  Surgeon: [name], MD;  Location: [facility];  Service: Endoscopy;  Laterality: N/A;  2 bands placed  . INCISION AND DRAINAGE ABSCESS Left 10/11/2013   Procedure: INCISION AND DRAINAGE AND DEBRIDEMENT LEFT BREAST  ABSCESS;  Surgeon: [name], MD;  Location: [facility];  Service: General;  Laterality: Left;  . IRRIGATION AND DEBRIDEMENT ABSCESS Right 06/01/2013   Procedure: INCISION AND DRAINAGE AND DEBRIDEMENT ABSCESS RIGHT BREAST;  Surgeon: [name], MD;  Location: [facility];  Service: General;  Laterality: Right;  . SAVORY DILATION N/A 05/17/2013   Procedure: SAVORY DILATION;  Surgeon: [name], MD;  Location: [facility];  Service: Endoscopy;  Laterality: N/A;  14/15/16  . TUBAL LIGATION      OB History    Gravida Para Term Preterm AB Living   2 2 1 1   2    SAB TAB Ectopic Multiple Live Births                   Home Medications    Prior to Admission medications   Medication Sig Start Date End Date Taking? Authorizing Provider  albuterol (PROVENTIL HFA;VENTOLIN HFA) 108 (90 Base) MCG/ACT inhaler Inhale 2 puffs into the lungs every 4 (four) hours as needed for wheezing or shortness of breath. 07/23/16  Yes Francine Graven, DO  albuterol (PROVENTIL) (2.5 MG/3ML) 0.083% nebulizer solution Inhale 3 mLs (2.5 mg total) into the lungs 3 (three) times daily. 10/29/16  Yes Sinda Du, MD  ALPRAZolam Duanne Moron) 1 MG tablet Take 1 mg by mouth 4 (four) times daily.     Yes Historical Provider, MD  atorvastatin (LIPITOR) 20 MG tablet Take 20 mg by mouth daily. Take along with 10 mg tablet to equal 30 mg daily.   Yes Historical Provider, MD  baclofen (LIORESAL) 20 MG tablet Take 20 mg by mouth 3 (three) times daily as needed for muscle spasms.   Yes Historical Provider, MD  carvedilol (COREG) 12.5 MG tablet Take 1 tablet (12.5 mg total) by mouth 2 (two) times daily with a  meal. 05/26/16  Yes Cherene Altes, MD  colchicine 0.6 MG tablet Take 1 tablet (0.6 mg total) by mouth every other day. 11/19/16  Yes Sinda Du, MD  estradiol (ESTRACE) 0.5 MG tablet Take 0.5 mg by mouth daily.   Yes Historical Provider, MD  fluticasone (FLONASE) 50 MCG/ACT nasal spray Place 2 sprays into both nostrils at bedtime.   Yes Historical Provider, MD  hydrALAZINE (APRESOLINE) 25 MG tablet Take 1 tablet (25 mg total) by mouth every 8 (eight) hours. 10/29/16  Yes Sinda Du, MD  HYDROcodone-acetaminophen (NORCO) 10-325 MG tablet Take 1 tablet by mouth every 4 (four) hours as needed for moderate pain.   Yes Historical Provider, MD  lisinopril-hydrochlorothiazide (PRINZIDE,ZESTORETIC) 20-12.5 MG tablet Take 1 tablet by mouth daily.   Yes Historical Provider, MD  loperamide (IMODIUM) 2 MG capsule Take 1 capsule (2 mg total) by mouth 4 (four) times daily as needed for diarrhea or loose stools. 10/23/16  Yes Evalee Jefferson, PA-C  metoCLOPramide (REGLAN) 10 MG tablet Take 1 tablet (10  mg total) by mouth every 6 (six) hours as needed for nausea (or headache). 02/25/18  Yes Delora Fuel, MD  PARoxetine (PAXIL) 20 MG tablet Take 1 daily. This is to prevent panic attacks Patient taking differently: Take 40 mg by mouth daily. Take 1 daily. This is to prevent panic attacks 10/28/16  Yes Sinda Du, MD  polyethylene glycol [facility] / GLYCOLAX) packet Take 17 g by mouth daily. 10/30/16  Yes Sinda Du, MD  torsemide (DEMADEX) 20 MG tablet Take 2 tablets (40 mg total) by mouth daily. 11/19/16  Yes Sinda Du, MD  atorvastatin (LIPITOR) 10 MG tablet Take 10 mg by mouth daily. Take along with 20 mg tablet to equal 30 mg daily.    Historical Provider, MD  rOPINIRole (REQUIP) 1 MG tablet Take 1 mg by mouth at bedtime.     Historical Provider, MD    Family History Family History  Problem Relation Age of Onset  . Adopted: Yes  . Family history unknown: Yes    Social History Social History    Substance Use Topics  . Smoking status: Former Smoker    Packs/day: 1.50    Years: 25.00    Types: Cigarettes    Quit date: 10/23/2007  . Smokeless tobacco: Former Systems developer     Comment: Quit x 6 years ago  . Alcohol use No     Allergies   Codeine   Review of Systems Review of Systems A complete 10 system review of systems was obtained and all systems are negative except as noted in the HPI and PMH.   Physical Exam Updated Vital Signs BP 140/87   Pulse 86   Temp 97.5 F (36.4 C) (Oral)   Resp 13   Ht 5\' 7"  (1.702 m)   Wt 266 lb (120.7 kg)   SpO2 99%   BMI 41.66 kg/m   Physical Exam  Constitutional: She is oriented to person, place, and time. She appears well-developed and well-nourished.  HENT:  Head: Normocephalic and atraumatic.  Eyes: Conjunctivae are normal.  Neck: Neck supple.  Cardiovascular: Normal rate and regular rhythm.   Pulmonary/Chest: Effort normal and breath sounds normal.  Abdominal: Soft. Bowel sounds are normal.  Musculoskeletal: Normal range of motion.  Neurological: She is alert and oriented to person, place, and time.  She answers questions slowly. She is able to move all 4 extremities but not briskly. She seems to have a flapping sensation with ROM.   Skin: Skin is warm and dry.  Psychiatric: She has a normal mood and affect. Her behavior is normal.  Nursing note and vitals reviewed.  ED Treatments / Results  DIAGNOSTIC STUDIES: Oxygen Saturation is 91% on RA, low by my interpretation.    COORDINATION OF CARE: 1:42 PM Discussed treatment plan with pt at bedside and pt agreed to plan. I will check the patietn's basic labs.  Labs (all labs ordered are listed, but only abnormal results are displayed) Labs Reviewed  CBC WITH DIFFERENTIAL/PLATELET - Abnormal; Notable for the following:       Result Value   RBC 3.81 (*)    Hemoglobin 10.7 (*)    HCT 33.3 (*)    Platelets 145 (*)    All other components within normal limits  COMPREHENSIVE  METABOLIC PANEL - Abnormal; Notable for the following:    Glucose, Bld 116 (*)    BUN 50 (*)    Creatinine, Ser 4.65 (*)    Calcium 10.8 (*)    AST 42 (*)  GFR calc non Af Amer 10 (*)    GFR calc Af Amer 12 (*)    All other components within normal limits  URINALYSIS, ROUTINE W REFLEX MICROSCOPIC - Abnormal; Notable for the following:    APPearance HAZY (*)    Hgb urine dipstick SMALL (*)    Protein, ur >=300 (*)    Bacteria, UA RARE (*)    Squamous Epithelial / LPF 6-30 (*)    All other components within normal limits  AMMONIA  TSH    EKG  EKG Interpretation  Date/Time:  Thursday November 20 2016 12:58:29 EDT Ventricular Rate:  72 PR Interval:    QRS Duration: 93 QT Interval:  355 QTC Calculation: 389 R Axis:   34 Text Interpretation:  Sinus rhythm Nonspecific T abnormalities, lateral leads no stemi Confirmed by LONG MD, JOSHUA ([id number]) on 11/20/2016 1:30:32 PM       Radiology No results found.  Procedures Procedures (including critical care time)  Medications Ordered in ED Medications  sodium chloride 0.9 % bolus 1,000 mL (0 mLs Intravenous Stopped 11/20/16 1953)  cloNIDine (CATAPRES) tablet 0.1 mg (0.1 mg Oral Given 11/20/16 1953)  nicardipine (CARDENE) 20mg  in 0.86% saline 262ml IV infusion (0.1 mg/ml) (3 mg/hr Intravenous New Bag/Given 11/20/16 2109)     Initial Impression / Assessment and Plan / ED Course  I have reviewed the triage vital signs and the nursing notes.  Pertinent labs & imaging results that were available during my care of the patient were reviewed by me and considered in my medical decision making (see chart for details). CRITICAL CARE Performed by: Nat Christen Total critical care time: 30 minutes Critical care time was exclusive of separately billable procedures and treating other patients. Critical care was necessary to treat or prevent imminent or life-threatening deterioration. Critical care was time spent personally by me on the following  activities: development of treatment plan with patient and/or surrogate as well as nursing, discussions with consultants, evaluation of patient's response to treatment, examination of patient, obtaining history from patient or surrogate, ordering and performing treatments and interventions, ordering and review of laboratory studies, ordering and review of radiographic studies, pulse oximetry and re-evaluation of patient's condition.   Patient has multiple health problems. She has a headache and numbness and tingling in her extremities. Blood pressure is elevated. Behavioral health consult obtained. She will be admitted for further evaluation and blood pressure management.   Final Clinical Impressions(s) / ED Diagnoses   Final diagnoses:  Hypertension, unspecified type    New Prescriptions New Prescriptions   No medications on file   I personally performed the services described in this documentation, which was scribed in my presence. The recorded information has been reviewed and is accurate.     Nat Christen, MD 11/20/16 [id number]    Nat Christen, MD 11/20/16 2145

## 2016-11-20 NOTE — ED Triage Notes (Signed)
Pt just discharged yesterday from [facility] facility. Pt c/o numbness and burning sensation to right side and back of head. No neuro deficits. VSS.

## 2016-11-20 NOTE — ED Notes (Signed)
Pt states that her "pulse ox probe is sending electrical shocks up my finger on my right hand causing my hand to spasm." This nurse checked the probe and put it on his finger, no shock noted. Pt refused to have probe put back on finger for fear of electrocution.

## 2016-11-21 ENCOUNTER — Encounter (HOSPITAL_COMMUNITY): Payer: Self-pay | Admitting: *Deleted

## 2016-11-21 LAB — COMPREHENSIVE METABOLIC PANEL
ALBUMIN: 3.6 g/dL (ref 3.5–5.0)
ALK PHOS: 66 U/L (ref 38–126)
ALT: 46 U/L (ref 14–54)
ANION GAP: 11 (ref 5–15)
AST: 29 U/L (ref 15–41)
BUN: 48 mg/dL — ABNORMAL HIGH (ref 6–20)
CALCIUM: 9.9 mg/dL (ref 8.9–10.3)
CHLORIDE: 108 mmol/L (ref 101–111)
CO2: 22 mmol/L (ref 22–32)
Creatinine, Ser: 4.3 mg/dL — ABNORMAL HIGH (ref 0.44–1.00)
GFR calc Af Amer: 13 mL/min — ABNORMAL LOW (ref >60)
GFR calc non Af Amer: 11 mL/min — ABNORMAL LOW (ref >60)
GLUCOSE: 105 mg/dL — AB (ref 65–99)
Potassium: 4 mmol/L (ref 3.5–5.1)
SODIUM: 141 mmol/L (ref 135–145)
Total Bilirubin: 0.6 mg/dL (ref 0.3–1.2)
Total Protein: 6.4 g/dL — ABNORMAL LOW (ref 6.5–8.1)

## 2016-11-21 LAB — CBC
HEMATOCRIT: 33.2 % — AB (ref 36.0–46.0)
HEMOGLOBIN: 10.9 g/dL — AB (ref 12.0–15.0)
MCH: 28.2 pg (ref 26.0–34.0)
MCHC: 32.8 g/dL (ref 30.0–36.0)
MCV: 85.8 fL (ref 78.0–100.0)
Platelets: 148 10*3/uL — ABNORMAL LOW (ref 150–400)
RBC: 3.87 MIL/uL (ref 3.87–5.11)
RDW: 15.2 % (ref 11.5–15.5)
WBC: 5.1 10*3/uL (ref 4.0–10.5)

## 2016-11-21 MED ORDER — ORAL CARE MOUTH RINSE
15.0000 mL | Freq: Two times a day (BID) | OROMUCOSAL | Status: DC
  Administered 2016-11-21 – 2016-11-29 (×8): 15 mL via OROMUCOSAL

## 2016-11-21 MED ORDER — CHLORHEXIDINE GLUCONATE 0.12 % MT SOLN
15.0000 mL | Freq: Two times a day (BID) | OROMUCOSAL | Status: DC
  Administered 2016-11-21 – 2016-12-01 (×17): 15 mL via OROMUCOSAL
  Filled 2016-11-21 (×18): qty 15

## 2016-11-21 MED ORDER — HYDROCHLOROTHIAZIDE 12.5 MG PO CAPS
12.5000 mg | ORAL_CAPSULE | Freq: Every day | ORAL | Status: DC
  Administered 2016-11-21 – 2016-11-29 (×9): 12.5 mg via ORAL
  Filled 2016-11-21 (×9): qty 1

## 2016-11-21 MED ORDER — LISINOPRIL 10 MG PO TABS
20.0000 mg | ORAL_TABLET | Freq: Every day | ORAL | Status: DC
  Administered 2016-11-21 – 2016-11-29 (×9): 20 mg via ORAL
  Filled 2016-11-21 (×9): qty 2
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
## 2016-11-21 NOTE — Progress Notes (Signed)
Subjective: She was admitted with hypertensive urgency. She has been hallucinating. She had more hallucinations last night. She seems better this morning but she's not back to her baseline.  Objective: Vital signs in last 24 hours: Temp:  [97.5 F (36.4 C)-98.4 F (36.9 C)] 98.4 F (36.9 C) (04/27 0527) Pulse Rate:  [74-90] 77 (04/27 0527) Resp:  [10-27] 16 (04/27 0527) BP: (120-202)/(69-120) 148/69 (04/27 0527) SpO2:  [91 %-99 %] 99 % (04/27 0527) Weight:  [117.3 kg (258 lb 9.6 oz)-120.7 kg (266 lb)] 117.3 kg (258 lb 9.6 oz) (04/26 2355) Weight change:  Last BM Date: 11/21/16  Intake/Output from previous day: 04/26 0701 - 04/27 0700 In: 428 [P.O.:400; I.V.:28] Out: 201 [Urine:200; Stool:1]  PHYSICAL EXAM General appearance: alert, cooperative, mild distress and morbidly obese Resp: clear to auscultation bilaterally Cardio: regular rate and rhythm, S1, S2 normal, no murmur, click, rub or gallop GI: soft, non-tender; bowel sounds normal; no masses,  no organomegaly Extremities: extremities normal, atraumatic, no cyanosis or edema Skin warm and dry.  Lab Results:  Results for orders placed or performed during the hospital encounter of 11/20/16 (from the past 48 hour(s))  CBC with Differential     Status: Abnormal   Collection Time: 11/20/16  2:08 PM  Result Value Ref Range   WBC 4.7 4.0 - 10.5 K/uL   RBC 3.81 (L) 3.87 - 5.11 MIL/uL   Hemoglobin 10.7 (L) 12.0 - 15.0 g/dL   HCT 33.3 (L) 36.0 - 46.0 %   MCV 87.4 78.0 - 100.0 fL   MCH 28.1 26.0 - 34.0 pg   MCHC 32.1 30.0 - 36.0 g/dL   RDW 15.1 11.5 - 15.5 %   Platelets 145 (L) 150 - 400 K/uL   Neutrophils Relative % 50 %   Neutro Abs 2.3 1.7 - 7.7 K/uL   Lymphocytes Relative 37 %   Lymphs Abs 1.7 0.7 - 4.0 K/uL   Monocytes Relative 9 %   Monocytes Absolute 0.4 0.1 - 1.0 K/uL   Eosinophils Relative 3 %   Eosinophils Absolute 0.2 0.0 - 0.7 K/uL   Basophils Relative 1 %   Basophils Absolute 0.0 0.0 - 0.1 K/uL   Comprehensive metabolic panel     Status: Abnormal   Collection Time: 11/20/16  2:08 PM  Result Value Ref Range   Sodium 140 135 - 145 mmol/L   Potassium 4.2 3.5 - 5.1 mmol/L   Chloride 109 101 - 111 mmol/L   CO2 23 22 - 32 mmol/L   Glucose, Bld 116 (H) 65 - 99 mg/dL   BUN 50 (H) 6 - 20 mg/dL   Creatinine, Ser 4.65 (H) 0.44 - 1.00 mg/dL   Calcium 10.8 (H) 8.9 - 10.3 mg/dL   Total Protein 6.8 6.5 - 8.1 g/dL   Albumin 3.9 3.5 - 5.0 g/dL   AST 42 (H) 15 - 41 U/L   ALT 49 14 - 54 U/L   Alkaline Phosphatase 70 38 - 126 U/L   Total Bilirubin 0.5 0.3 - 1.2 mg/dL   GFR calc non Af Amer 10 (L) >60 mL/min   GFR calc Af Amer 12 (L) >60 mL/min    Comment: (NOTE) The eGFR has been calculated using the CKD EPI equation. This calculation has not been validated in all clinical situations. eGFR's persistently <60 mL/min signify possible Chronic Kidney Disease.    Anion gap 8 5 - 15  Ammonia     Status: None   Collection Time: 11/20/16  2:08 PM  Result Value Ref Range   Ammonia 22 9 - 35 umol/L  TSH     Status: None   Collection Time: 11/20/16  2:08 PM  Result Value Ref Range   TSH 2.640 0.350 - 4.500 uIU/mL    Comment: Performed by a 3rd Generation assay with a functional sensitivity of <=0.01 uIU/mL.  Urinalysis, Routine w reflex microscopic     Status: Abnormal   Collection Time: 11/20/16  2:15 PM  Result Value Ref Range   Color, Urine YELLOW YELLOW   APPearance HAZY (A) CLEAR   Specific Gravity, Urine 1.012 1.005 - 1.030   pH 5.0 5.0 - 8.0   Glucose, UA NEGATIVE NEGATIVE mg/dL   Hgb urine dipstick SMALL (A) NEGATIVE   Bilirubin Urine NEGATIVE NEGATIVE   Ketones, ur NEGATIVE NEGATIVE mg/dL   Protein, ur >=055 (A) NEGATIVE mg/dL   Nitrite NEGATIVE NEGATIVE   Leukocytes, UA NEGATIVE NEGATIVE   RBC / HPF NONE SEEN 0 - 5 RBC/hpf   WBC, UA 0-5 0 - 5 WBC/hpf   Bacteria, UA RARE (A) NONE SEEN   Squamous Epithelial / LPF 6-30 (A) NONE SEEN   Hyaline Casts, UA PRESENT   Comprehensive  metabolic panel     Status: Abnormal   Collection Time: 11/21/16  6:15 AM  Result Value Ref Range   Sodium 141 135 - 145 mmol/L   Potassium 4.0 3.5 - 5.1 mmol/L   Chloride 108 101 - 111 mmol/L   CO2 22 22 - 32 mmol/L   Glucose, Bld 105 (H) 65 - 99 mg/dL   BUN 48 (H) 6 - 20 mg/dL   Creatinine, Ser 8.79 (H) 0.44 - 1.00 mg/dL   Calcium 9.9 8.9 - 78.4 mg/dL   Total Protein 6.4 (L) 6.5 - 8.1 g/dL   Albumin 3.6 3.5 - 5.0 g/dL   AST 29 15 - 41 U/L   ALT 46 14 - 54 U/L   Alkaline Phosphatase 66 38 - 126 U/L   Total Bilirubin 0.6 0.3 - 1.2 mg/dL   GFR calc non Af Amer 11 (L) >60 mL/min   GFR calc Af Amer 13 (L) >60 mL/min    Comment: (NOTE) The eGFR has been calculated using the CKD EPI equation. This calculation has not been validated in all clinical situations. eGFR's persistently <60 mL/min signify possible Chronic Kidney Disease.    Anion gap 11 5 - 15  CBC     Status: Abnormal   Collection Time: 11/21/16  6:15 AM  Result Value Ref Range   WBC 5.1 4.0 - 10.5 K/uL   RBC 3.87 3.87 - 5.11 MIL/uL   Hemoglobin 10.9 (L) 12.0 - 15.0 g/dL   HCT 91.4 (L) 76.0 - 75.9 %   MCV 85.8 78.0 - 100.0 fL   MCH 28.2 26.0 - 34.0 pg   MCHC 32.8 30.0 - 36.0 g/dL   RDW 78.9 64.5 - 27.3 %   Platelets 148 (L) 150 - 400 K/uL    ABGS No results for input(s): PHART, PO2ART, TCO2, HCO3 in the last 72 hours.  Invalid input(s): PCO2 CULTURES No results found for this or any previous visit (from the past 240 hour(s)). Studies/Results: No results found.  Medications:  Prior to Admission:  Prescriptions Prior to Admission  Medication Sig Dispense Refill Last Dose  . albuterol (PROVENTIL HFA;VENTOLIN HFA) 108 (90 Base) MCG/ACT inhaler Inhale 2 puffs into the lungs every 4 (four) hours as needed for wheezing or shortness of breath. 1 Inhaler 0 11/20/2016 at  Unknown time  . albuterol (PROVENTIL) (2.5 MG/3ML) 0.083% nebulizer solution Inhale 3 mLs (2.5 mg total) into the lungs 3 (three) times daily. 75 mL  12 11/20/2016 at Unknown time  . ALPRAZolam (XANAX) 1 MG tablet Take 1 mg by mouth 4 (four) times daily.     11/20/2016 at Unknown time  . atorvastatin (LIPITOR) 20 MG tablet Take 20 mg by mouth daily. Take along with 10 mg tablet to equal 30 mg daily.   11/19/2016 at Unknown time  . baclofen (LIORESAL) 20 MG tablet Take 20 mg by mouth 3 (three) times daily as needed for muscle spasms.   11/19/2016 at Unknown time  . carvedilol (COREG) 12.5 MG tablet Take 1 tablet (12.5 mg total) by mouth 2 (two) times daily with a meal. 60 tablet 0 11/20/2016 at 1000  . colchicine 0.6 MG tablet Take 1 tablet (0.6 mg total) by mouth every other day. 16 tablet 12   . estradiol (ESTRACE) 0.5 MG tablet Take 0.5 mg by mouth daily.     . fluticasone (FLONASE) 50 MCG/ACT nasal spray Place 2 sprays into both nostrils at bedtime.   11/19/2016 at Unknown time  . hydrALAZINE (APRESOLINE) 25 MG tablet Take 1 tablet (25 mg total) by mouth every 8 (eight) hours. 90 tablet 12 11/20/2016 at Unknown time  . HYDROcodone-acetaminophen (NORCO) 10-325 MG tablet Take 1 tablet by mouth every 4 (four) hours as needed for moderate pain.   11/20/2016 at Unknown time  . lisinopril-hydrochlorothiazide (PRINZIDE,ZESTORETIC) 20-12.5 MG tablet Take 1 tablet by mouth daily.   11/20/2016 at Unknown time  . loperamide (IMODIUM) 2 MG capsule Take 1 capsule (2 mg total) by mouth 4 (four) times daily as needed for diarrhea or loose stools. 12 capsule 0 11/20/2016 at Unknown time  . metoCLOPramide (REGLAN) 10 MG tablet Take 1 tablet (10 mg total) by mouth every 6 (six) hours as needed for nausea (or headache). 30 tablet 0 11/20/2016 at Unknown time  . PARoxetine (PAXIL) 20 MG tablet Take 1 daily. This is to prevent panic attacks (Patient taking differently: Take 40 mg by mouth daily. Take 1 daily. This is to prevent panic attacks) 30 tablet 12 11/19/2016 at Unknown time  . polyethylene glycol (MIRALAX / GLYCOLAX) packet Take 17 g by mouth daily. 14 each 0 Past Week  at Unknown time  . torsemide (DEMADEX) 20 MG tablet Take 2 tablets (40 mg total) by mouth daily. 60 tablet 12 11/19/2016 at Unknown time  . atorvastatin (LIPITOR) 10 MG tablet Take 10 mg by mouth daily. Take along with 20 mg tablet to equal 30 mg daily.   Not Taking at Unknown time  . rOPINIRole (REQUIP) 1 MG tablet Take 1 mg by mouth at bedtime.    Not Taking at Unknown time   Scheduled: . albuterol  2.5 mg Inhalation TID  . ALPRAZolam  1 mg Oral QID  . atorvastatin  10 mg Oral Daily  . atorvastatin  20 mg Oral Daily  . carvedilol  12.5 mg Oral BID WC  . chlorhexidine  15 mL Mouth Rinse BID  . colchicine  0.6 mg Oral Q48H  . estradiol  0.5 mg Oral Daily  . fluticasone  2 spray Each Nare QHS  . heparin  5,000 Units Subcutaneous Q8H  . hydrALAZINE  25 mg Oral Q8H  . lisinopril  20 mg Oral Daily   And  . hydrochlorothiazide  12.5 mg Oral Daily  . mouth rinse  15 mL Mouth Rinse q12n4p  .  PARoxetine  40 mg Oral Daily  . polyethylene glycol  17 g Oral Daily  . sodium chloride flush  3 mL Intravenous Q12H  . torsemide  40 mg Oral Daily   Continuous:  FWY:[id number], labetalol, loperamide, metoCLOPramide  Assesment: She was admitted with malignant hypertension. This is better. I think she may not have taken her medications at home. She has been started on antidepressant. She's been hallucinating so we need to adjust her meds. She is better but I don't think she's ready for discharge Principal Problem:   HTN (hypertension), malignant Active Problems:   Diabetes mellitus   Bipolar 1 disorder ([place])   Noncompliance with medication regimen   Panic attack    Plan: Continue current treatments    LOS: 0 days   Calahan Pak L 11/21/2016, 9:06 AM

## 2016-11-22 ENCOUNTER — Inpatient Hospital Stay (HOSPITAL_COMMUNITY): Payer: Medicaid Other

## 2016-11-22 DIAGNOSIS — F23 Brief psychotic disorder: Secondary | ICD-10-CM | POA: Diagnosis present

## 2016-11-22 DIAGNOSIS — N185 Chronic kidney disease, stage 5: Secondary | ICD-10-CM | POA: Diagnosis present

## 2016-11-22 DIAGNOSIS — J449 Chronic obstructive pulmonary disease, unspecified: Secondary | ICD-10-CM | POA: Diagnosis present

## 2016-11-22 DIAGNOSIS — E870 Hyperosmolality and hypernatremia: Secondary | ICD-10-CM | POA: Diagnosis present

## 2016-11-22 DIAGNOSIS — M898X9 Other specified disorders of bone, unspecified site: Secondary | ICD-10-CM | POA: Diagnosis present

## 2016-11-22 DIAGNOSIS — I120 Hypertensive chronic kidney disease with stage 5 chronic kidney disease or end stage renal disease: Secondary | ICD-10-CM | POA: Diagnosis present

## 2016-11-22 DIAGNOSIS — F410 Panic disorder [episodic paroxysmal anxiety] without agoraphobia: Secondary | ICD-10-CM | POA: Diagnosis present

## 2016-11-22 DIAGNOSIS — M5412 Radiculopathy, cervical region: Secondary | ICD-10-CM | POA: Diagnosis present

## 2016-11-22 DIAGNOSIS — I160 Hypertensive urgency: Secondary | ICD-10-CM | POA: Diagnosis present

## 2016-11-22 DIAGNOSIS — E669 Obesity, unspecified: Secondary | ICD-10-CM | POA: Diagnosis present

## 2016-11-22 DIAGNOSIS — G4710 Hypersomnia, unspecified: Secondary | ICD-10-CM | POA: Diagnosis present

## 2016-11-22 DIAGNOSIS — Z6839 Body mass index (BMI) 39.0-39.9, adult: Secondary | ICD-10-CM | POA: Diagnosis not present

## 2016-11-22 DIAGNOSIS — R410 Disorientation, unspecified: Secondary | ICD-10-CM | POA: Diagnosis present

## 2016-11-22 DIAGNOSIS — K219 Gastro-esophageal reflux disease without esophagitis: Secondary | ICD-10-CM | POA: Diagnosis present

## 2016-11-22 DIAGNOSIS — G4733 Obstructive sleep apnea (adult) (pediatric): Secondary | ICD-10-CM | POA: Diagnosis present

## 2016-11-22 DIAGNOSIS — Z9114 Patient's other noncompliance with medication regimen: Secondary | ICD-10-CM | POA: Diagnosis not present

## 2016-11-22 DIAGNOSIS — R482 Apraxia: Secondary | ICD-10-CM | POA: Diagnosis present

## 2016-11-22 DIAGNOSIS — F319 Bipolar disorder, unspecified: Secondary | ICD-10-CM | POA: Diagnosis present

## 2016-11-22 DIAGNOSIS — E1122 Type 2 diabetes mellitus with diabetic chronic kidney disease: Secondary | ICD-10-CM | POA: Diagnosis present

## 2016-11-22 DIAGNOSIS — I10 Essential (primary) hypertension: Secondary | ICD-10-CM | POA: Diagnosis present

## 2016-11-22 DIAGNOSIS — G92 Toxic encephalopathy: Secondary | ICD-10-CM | POA: Diagnosis present

## 2016-11-22 DIAGNOSIS — G2581 Restless legs syndrome: Secondary | ICD-10-CM | POA: Diagnosis present

## 2016-11-22 DIAGNOSIS — E43 Unspecified severe protein-calorie malnutrition: Secondary | ICD-10-CM | POA: Diagnosis present

## 2016-11-22 DIAGNOSIS — E876 Hypokalemia: Secondary | ICD-10-CM | POA: Diagnosis present

## 2016-11-22 DIAGNOSIS — E860 Dehydration: Secondary | ICD-10-CM | POA: Diagnosis present

## 2016-11-22 DIAGNOSIS — Z87891 Personal history of nicotine dependence: Secondary | ICD-10-CM | POA: Diagnosis not present

## 2016-11-22 MED ORDER — SODIUM CHLORIDE 0.9 % IV SOLN
INTRAVENOUS | Status: DC
  Administered 2016-11-22 – 2016-11-27 (×7): via INTRAVENOUS

## 2016-11-22 MED ORDER — RISPERIDONE 0.5 MG PO TABS
0.2500 mg | ORAL_TABLET | Freq: Two times a day (BID) | ORAL | Status: DC
  Administered 2016-11-22 – 2016-11-25 (×6): 0.25 mg via ORAL
  Filled 2016-11-22 (×6): qty 1

## 2016-11-22 NOTE — Progress Notes (Signed)
Dr. Willey Blade notified of BP and scheduled meds given.  No new orders at this time.  Will recheck in one hour.

## 2016-11-22 NOTE — Progress Notes (Signed)
Subjective: She remains confused. She is clearly hallucinating. She's not eating or drinking well. Discussed with family at bedside. They say that she had been in her usual state of health when she had sudden change and became very confused and started hallucinating. She had CT brain about a week ago that was negative for acute stroke but I'm going to have her get another one.  Objective: Vital signs in last 24 hours: Temp:  [98.6 F (37 C)-98.8 F (37.1 C)] 98.6 F (37 C) (04/28 0500) Pulse Rate:  [72-90] 72 (04/28 0500) Resp:  [16-18] 18 (04/28 0500) BP: (122-166)/(62-87) 128/62 (04/28 0500) SpO2:  [94 %-98 %] 94 % (04/28 0500) Weight change:  Last BM Date: 11/21/16  Intake/Output from previous day: 04/27 0701 - 04/28 0700 In: 720 [P.O.:720] Out: 250 [Urine:250]  PHYSICAL EXAM General appearance: alert and Confused and hallucinating Resp: clear to auscultation bilaterally Cardio: regular rate and rhythm, S1, S2 normal, no murmur, click, rub or gallop GI: soft, non-tender; bowel sounds normal; no masses,  no organomegaly Extremities: extremities normal, atraumatic, no cyanosis or edema Skin warm and dry  Lab Results:  Results for orders placed or performed during the hospital encounter of 11/20/16 (from the past 48 hour(s))  CBC with Differential     Status: Abnormal   Collection Time: 11/20/16  2:08 PM  Result Value Ref Range   WBC 4.7 4.0 - 10.5 K/uL   RBC 3.81 (L) 3.87 - 5.11 MIL/uL   Hemoglobin 10.7 (L) 12.0 - 15.0 g/dL   HCT 33.3 (L) 36.0 - 46.0 %   MCV 87.4 78.0 - 100.0 fL   MCH 28.1 26.0 - 34.0 pg   MCHC 32.1 30.0 - 36.0 g/dL   RDW 15.1 11.5 - 15.5 %   Platelets 145 (L) 150 - 400 K/uL   Neutrophils Relative % 50 %   Neutro Abs 2.3 1.7 - 7.7 K/uL   Lymphocytes Relative 37 %   Lymphs Abs 1.7 0.7 - 4.0 K/uL   Monocytes Relative 9 %   Monocytes Absolute 0.4 0.1 - 1.0 K/uL   Eosinophils Relative 3 %   Eosinophils Absolute 0.2 0.0 - 0.7 K/uL   Basophils Relative  1 %   Basophils Absolute 0.0 0.0 - 0.1 K/uL  Comprehensive metabolic panel     Status: Abnormal   Collection Time: 11/20/16  2:08 PM  Result Value Ref Range   Sodium 140 135 - 145 mmol/L   Potassium 4.2 3.5 - 5.1 mmol/L   Chloride 109 101 - 111 mmol/L   CO2 23 22 - 32 mmol/L   Glucose, Bld 116 (H) 65 - 99 mg/dL   BUN 50 (H) 6 - 20 mg/dL   Creatinine, Ser 4.65 (H) 0.44 - 1.00 mg/dL   Calcium 10.8 (H) 8.9 - 10.3 mg/dL   Total Protein 6.8 6.5 - 8.1 g/dL   Albumin 3.9 3.5 - 5.0 g/dL   AST 42 (H) 15 - 41 U/L   ALT 49 14 - 54 U/L   Alkaline Phosphatase 70 38 - 126 U/L   Total Bilirubin 0.5 0.3 - 1.2 mg/dL   GFR calc non Af Amer 10 (L) >60 mL/min   GFR calc Af Amer 12 (L) >60 mL/min    Comment: (NOTE) The eGFR has been calculated using the CKD EPI equation. This calculation has not been validated in all clinical situations. eGFR's persistently <60 mL/min signify possible Chronic Kidney Disease.    Anion gap 8 5 - 15  Ammonia  Status: None   Collection Time: 11/20/16  2:08 PM  Result Value Ref Range   Ammonia 22 9 - 35 umol/L  TSH     Status: None   Collection Time: 11/20/16  2:08 PM  Result Value Ref Range   TSH 2.640 0.350 - 4.500 uIU/mL    Comment: Performed by a 3rd Generation assay with a functional sensitivity of <=0.01 uIU/mL.  Urinalysis, Routine w reflex microscopic     Status: Abnormal   Collection Time: 11/20/16  2:15 PM  Result Value Ref Range   Color, Urine YELLOW YELLOW   APPearance HAZY (A) CLEAR   Specific Gravity, Urine 1.012 1.005 - 1.030   pH 5.0 5.0 - 8.0   Glucose, UA NEGATIVE NEGATIVE mg/dL   Hgb urine dipstick SMALL (A) NEGATIVE   Bilirubin Urine NEGATIVE NEGATIVE   Ketones, ur NEGATIVE NEGATIVE mg/dL   Protein, ur >=300 (A) NEGATIVE mg/dL   Nitrite NEGATIVE NEGATIVE   Leukocytes, UA NEGATIVE NEGATIVE   RBC / HPF NONE SEEN 0 - 5 RBC/hpf   WBC, UA 0-5 0 - 5 WBC/hpf   Bacteria, UA RARE (A) NONE SEEN   Squamous Epithelial / LPF 6-30 (A) NONE SEEN    Hyaline Casts, UA PRESENT   Comprehensive metabolic panel     Status: Abnormal   Collection Time: 11/21/16  6:15 AM  Result Value Ref Range   Sodium 141 135 - 145 mmol/L   Potassium 4.0 3.5 - 5.1 mmol/L   Chloride 108 101 - 111 mmol/L   CO2 22 22 - 32 mmol/L   Glucose, Bld 105 (H) 65 - 99 mg/dL   BUN 48 (H) 6 - 20 mg/dL   Creatinine, Ser 4.30 (H) 0.44 - 1.00 mg/dL   Calcium 9.9 8.9 - 10.3 mg/dL   Total Protein 6.4 (L) 6.5 - 8.1 g/dL   Albumin 3.6 3.5 - 5.0 g/dL   AST 29 15 - 41 U/L   ALT 46 14 - 54 U/L   Alkaline Phosphatase 66 38 - 126 U/L   Total Bilirubin 0.6 0.3 - 1.2 mg/dL   GFR calc non Af Amer 11 (L) >60 mL/min   GFR calc Af Amer 13 (L) >60 mL/min    Comment: (NOTE) The eGFR has been calculated using the CKD EPI equation. This calculation has not been validated in all clinical situations. eGFR's persistently <60 mL/min signify possible Chronic Kidney Disease.    Anion gap 11 5 - 15  CBC     Status: Abnormal   Collection Time: 11/21/16  6:15 AM  Result Value Ref Range   WBC 5.1 4.0 - 10.5 K/uL   RBC 3.87 3.87 - 5.11 MIL/uL   Hemoglobin 10.9 (L) 12.0 - 15.0 g/dL   HCT 33.2 (L) 36.0 - 46.0 %   MCV 85.8 78.0 - 100.0 fL   MCH 28.2 26.0 - 34.0 pg   MCHC 32.8 30.0 - 36.0 g/dL   RDW 15.2 11.5 - 15.5 %   Platelets 148 (L) 150 - 400 K/uL    ABGS No results for input(s): PHART, PO2ART, TCO2, HCO3 in the last 72 hours.  Invalid input(s): PCO2 CULTURES No results found for this or any previous visit (from the past 240 hour(s)). Studies/Results: No results found.  Medications:  Prior to Admission:  Prescriptions Prior to Admission  Medication Sig Dispense Refill Last Dose  . albuterol (PROVENTIL HFA;VENTOLIN HFA) 108 (90 Base) MCG/ACT inhaler Inhale 2 puffs into the lungs every 4 (four) hours as needed   for wheezing or shortness of breath. 1 Inhaler 0 11/20/2016 at Unknown time  . albuterol (PROVENTIL) (2.5 MG/3ML) 0.083% nebulizer solution Inhale 3 mLs (2.5 mg total)  into the lungs 3 (three) times daily. 75 mL 12 11/20/2016 at Unknown time  . ALPRAZolam (XANAX) 1 MG tablet Take 1 mg by mouth 4 (four) times daily.     11/20/2016 at Unknown time  . atorvastatin (LIPITOR) 20 MG tablet Take 20 mg by mouth daily. Take along with 10 mg tablet to equal 30 mg daily.   11/19/2016 at Unknown time  . baclofen (LIORESAL) 20 MG tablet Take 20 mg by mouth 3 (three) times daily as needed for muscle spasms.   11/19/2016 at Unknown time  . carvedilol (COREG) 12.5 MG tablet Take 1 tablet (12.5 mg total) by mouth 2 (two) times daily with a meal. 60 tablet 0 11/20/2016 at 1000  . colchicine 0.6 MG tablet Take 1 tablet (0.6 mg total) by mouth every other day. 16 tablet 12   . estradiol (ESTRACE) 0.5 MG tablet Take 0.5 mg by mouth daily.     . fluticasone (FLONASE) 50 MCG/ACT nasal spray Place 2 sprays into both nostrils at bedtime.   11/19/2016 at Unknown time  . hydrALAZINE (APRESOLINE) 25 MG tablet Take 1 tablet (25 mg total) by mouth every 8 (eight) hours. 90 tablet 12 11/20/2016 at Unknown time  . HYDROcodone-acetaminophen (NORCO) 10-325 MG tablet Take 1 tablet by mouth every 4 (four) hours as needed for moderate pain.   11/20/2016 at Unknown time  . lisinopril-hydrochlorothiazide (PRINZIDE,ZESTORETIC) 20-12.5 MG tablet Take 1 tablet by mouth daily.   11/20/2016 at Unknown time  . loperamide (IMODIUM) 2 MG capsule Take 1 capsule (2 mg total) by mouth 4 (four) times daily as needed for diarrhea or loose stools. 12 capsule 0 11/20/2016 at Unknown time  . metoCLOPramide (REGLAN) 10 MG tablet Take 1 tablet (10 mg total) by mouth every 6 (six) hours as needed for nausea (or headache). 30 tablet 0 11/20/2016 at Unknown time  . PARoxetine (PAXIL) 20 MG tablet Take 1 daily. This is to prevent panic attacks (Patient taking differently: Take 40 mg by mouth daily. Take 1 daily. This is to prevent panic attacks) 30 tablet 12 11/19/2016 at Unknown time  . polyethylene glycol (MIRALAX / GLYCOLAX) packet  Take 17 g by mouth daily. 14 each 0 Past Week at Unknown time  . torsemide (DEMADEX) 20 MG tablet Take 2 tablets (40 mg total) by mouth daily. 60 tablet 12 11/19/2016 at Unknown time  . atorvastatin (LIPITOR) 10 MG tablet Take 10 mg by mouth daily. Take along with 20 mg tablet to equal 30 mg daily.   Not Taking at Unknown time  . rOPINIRole (REQUIP) 1 MG tablet Take 1 mg by mouth at bedtime.    Not Taking at Unknown time   Scheduled: . albuterol  2.5 mg Inhalation TID  . ALPRAZolam  1 mg Oral QID  . atorvastatin  10 mg Oral Daily  . atorvastatin  20 mg Oral Daily  . carvedilol  12.5 mg Oral BID WC  . chlorhexidine  15 mL Mouth Rinse BID  . colchicine  0.6 mg Oral Q48H  . estradiol  0.5 mg Oral Daily  . fluticasone  2 spray Each Nare QHS  . heparin  5,000 Units Subcutaneous Q8H  . hydrALAZINE  25 mg Oral Q8H  . lisinopril  20 mg Oral Daily   And  . hydrochlorothiazide  12.5 mg Oral Daily  .   mouth rinse  15 mL Mouth Rinse q12n4p  . PARoxetine  40 mg Oral Daily  . polyethylene glycol  17 g Oral Daily  . risperiDONE  0.25 mg Oral BID  . sodium chloride flush  3 mL Intravenous Q12H  . torsemide  40 mg Oral Daily   Continuous: . sodium chloride     PRN:albuterol, labetalol, loperamide, metoCLOPramide  Assesment: She was admitted with malignant hypertension. She is hallucinating. She was evaluated by psychiatry but not felt to need inpatient psychiatric treatment. I'm concerned considering her blood pressure that she may have had a stroke that set this off. At baseline she has bipolar disease so it may simply be a problem with that. Principal Problem:   HTN (hypertension), malignant Active Problems:   Diabetes mellitus   Bipolar 1 disorder ([facility])   Noncompliance with medication regimen   Panic attack   Confusion    Plan: Because she's not eating on going to start her on IV fluids. Continue treatments for blood pressure as needed. repeat blood testing in the morning. Repeat CT of  the brain. I will request neurology consultation but that's not generally available on the weekends here    LOS: 0 days   , L 11/22/2016, 9:40 AM  

## 2016-11-23 LAB — CBC WITH DIFFERENTIAL/PLATELET
Basophils Absolute: 0 10*3/uL (ref 0.0–0.1)
Basophils Relative: 1 %
Eosinophils Absolute: 0.2 10*3/uL (ref 0.0–0.7)
Eosinophils Relative: 3 %
HEMATOCRIT: 34.9 % — AB (ref 36.0–46.0)
HEMOGLOBIN: 11.3 g/dL — AB (ref 12.0–15.0)
LYMPHS ABS: 2.1 10*3/uL (ref 0.7–4.0)
LYMPHS PCT: 43 %
MCH: 28 pg (ref 26.0–34.0)
MCHC: 32.4 g/dL (ref 30.0–36.0)
MCV: 86.4 fL (ref 78.0–100.0)
Monocytes Absolute: 0.5 10*3/uL (ref 0.1–1.0)
Monocytes Relative: 11 %
NEUTROS PCT: 42 %
Neutro Abs: 2 10*3/uL (ref 1.7–7.7)
Platelets: 145 10*3/uL — ABNORMAL LOW (ref 150–400)
RBC: 4.04 MIL/uL (ref 3.87–5.11)
RDW: 14.9 % (ref 11.5–15.5)
WBC: 4.8 10*3/uL (ref 4.0–10.5)

## 2016-11-23 LAB — BASIC METABOLIC PANEL
ANION GAP: 10 (ref 5–15)
BUN: 50 mg/dL — ABNORMAL HIGH (ref 6–20)
CHLORIDE: 111 mmol/L (ref 101–111)
CO2: 23 mmol/L (ref 22–32)
Calcium: 9.2 mg/dL (ref 8.9–10.3)
Creatinine, Ser: 4.14 mg/dL — ABNORMAL HIGH (ref 0.44–1.00)
GFR calc non Af Amer: 12 mL/min — ABNORMAL LOW (ref >60)
GFR, EST AFRICAN AMERICAN: 14 mL/min — AB (ref >60)
Glucose, Bld: 108 mg/dL — ABNORMAL HIGH (ref 65–99)
Potassium: 3.5 mmol/L (ref 3.5–5.1)
SODIUM: 144 mmol/L (ref 135–145)

## 2016-11-23 LAB — AMMONIA: Ammonia: 16 umol/L (ref 9–35)

## 2016-11-23 MED ORDER — ALPRAZOLAM 0.5 MG PO TABS
0.5000 mg | ORAL_TABLET | Freq: Four times a day (QID) | ORAL | Status: DC
  Administered 2016-11-23 – 2016-11-25 (×7): 0.5 mg via ORAL
  Filled 2016-11-23 (×9): qty 1

## 2016-11-23 NOTE — Progress Notes (Addendum)
Subjective: She remains confused. She is a little bit better. It is not clear what the confusion is from.  Objective: Vital signs in last 24 hours: Pulse Rate:  [77-108] 108 (04/28 2355) Resp:  [18] 18 (04/28 2355) BP: (131-192)/(84-110) 158/94 (04/28 1614) SpO2:  [95 %-100 %] 98 % (04/29 0801) Weight change:  Last BM Date: 11/22/16  Intake/Output from previous day: 04/28 0701 - 04/29 0700 In: 228 [I.V.:228] Out: -   PHYSICAL EXAM General appearance: Confused and hallucinating Resp: clear to auscultation bilaterally Cardio: regular rate and rhythm, S1, S2 normal, no murmur, click, rub or gallop GI: soft, non-tender; bowel sounds normal; no masses,  no organomegaly Extremities: extremities normal, atraumatic, no cyanosis or edema Mucous membranes are moist  Lab Results:  Results for orders placed or performed during the hospital encounter of 11/20/16 (from the past 48 hour(s))  Basic metabolic panel     Status: Abnormal   Collection Time: 11/23/16  5:38 AM  Result Value Ref Range   Sodium 144 135 - 145 mmol/L   Potassium 3.5 3.5 - 5.1 mmol/L   Chloride 111 101 - 111 mmol/L   CO2 23 22 - 32 mmol/L   Glucose, Bld 108 (H) 65 - 99 mg/dL   BUN 50 (H) 6 - 20 mg/dL   Creatinine, Ser 0.87 (H) 0.44 - 1.00 mg/dL   Calcium 9.2 8.9 - 90.3 mg/dL   GFR calc non Af Amer 12 (L) >60 mL/min   GFR calc Af Amer 14 (L) >60 mL/min    Comment: (NOTE) The eGFR has been calculated using the CKD EPI equation. This calculation has not been validated in all clinical situations. eGFR's persistently <60 mL/min signify possible Chronic Kidney Disease.    Anion gap 10 5 - 15  Ammonia     Status: None   Collection Time: 11/23/16  5:38 AM  Result Value Ref Range   Ammonia 16 9 - 35 umol/L  CBC with Differential/Platelet     Status: Abnormal   Collection Time: 11/23/16  5:38 AM  Result Value Ref Range   WBC 4.8 4.0 - 10.5 K/uL   RBC 4.04 3.87 - 5.11 MIL/uL   Hemoglobin 11.3 (L) 12.0 - 15.0 g/dL    HCT 79.0 (L) 84.2 - 46.0 %   MCV 86.4 78.0 - 100.0 fL   MCH 28.0 26.0 - 34.0 pg   MCHC 32.4 30.0 - 36.0 g/dL   RDW 67.2 90.4 - 95.1 %   Platelets 145 (L) 150 - 400 K/uL   Neutrophils Relative % 42 %   Neutro Abs 2.0 1.7 - 7.7 K/uL   Lymphocytes Relative 43 %   Lymphs Abs 2.1 0.7 - 4.0 K/uL   Monocytes Relative 11 %   Monocytes Absolute 0.5 0.1 - 1.0 K/uL   Eosinophils Relative 3 %   Eosinophils Absolute 0.2 0.0 - 0.7 K/uL   Basophils Relative 1 %   Basophils Absolute 0.0 0.0 - 0.1 K/uL    ABGS No results for input(s): PHART, PO2ART, TCO2, HCO3 in the last 72 hours.  Invalid input(s): PCO2 CULTURES No results found for this or any previous visit (from the past 240 hour(s)). Studies/Results: Ct Head Wo Contrast  Result Date: 11/22/2016 CLINICAL DATA:  Confusion with hallucinations EXAM: CT HEAD WITHOUT CONTRAST TECHNIQUE: Contiguous axial images were obtained from the base of the skull through the vertex without intravenous contrast. COMPARISON:  November 15, 2016 FINDINGS: Brain: The ventricles are normal in size and configuration. There is no  intracranial mass, hemorrhage, extra-axial fluid collection, or midline shift. The gray-white compartments are normal. No acute infarct evident. Vascular: No hyperdense vessel. There is calcification in the left carotid siphon region. Skull: Bony calvarium appears intact. Sinuses/Orbits: Visualized paranasal sinuses are clear. Orbits appear symmetric bilaterally. Other: Visualized mastoid air cells are clear. IMPRESSION: Slight calcification in the left carotid siphon. No intracranial mass, hemorrhage, or extra-axial fluid collection. Gray-white compartments are normal. Electronically Signed   By: Lowella Grip III M.D.   On: 11/22/2016 14:38    Medications:  Prior to Admission:  Prescriptions Prior to Admission  Medication Sig Dispense Refill Last Dose  . albuterol (PROVENTIL HFA;VENTOLIN HFA) 108 (90 Base) MCG/ACT inhaler Inhale 2 puffs  into the lungs every 4 (four) hours as needed for wheezing or shortness of breath. 1 Inhaler 0 11/20/2016 at Unknown time  . albuterol (PROVENTIL) (2.5 MG/3ML) 0.083% nebulizer solution Inhale 3 mLs (2.5 mg total) into the lungs 3 (three) times daily. 75 mL 12 11/20/2016 at Unknown time  . ALPRAZolam (XANAX) 1 MG tablet Take 1 mg by mouth 4 (four) times daily.     11/20/2016 at Unknown time  . atorvastatin (LIPITOR) 20 MG tablet Take 20 mg by mouth daily. Take along with 10 mg tablet to equal 30 mg daily.   11/19/2016 at Unknown time  . baclofen (LIORESAL) 20 MG tablet Take 20 mg by mouth 3 (three) times daily as needed for muscle spasms.   11/19/2016 at Unknown time  . carvedilol (COREG) 12.5 MG tablet Take 1 tablet (12.5 mg total) by mouth 2 (two) times daily with a meal. 60 tablet 0 11/20/2016 at 1000  . colchicine 0.6 MG tablet Take 1 tablet (0.6 mg total) by mouth every other day. 16 tablet 12   . estradiol (ESTRACE) 0.5 MG tablet Take 0.5 mg by mouth daily.     . fluticasone (FLONASE) 50 MCG/ACT nasal spray Place 2 sprays into both nostrils at bedtime.   11/19/2016 at Unknown time  . hydrALAZINE (APRESOLINE) 25 MG tablet Take 1 tablet (25 mg total) by mouth every 8 (eight) hours. 90 tablet 12 11/20/2016 at Unknown time  . HYDROcodone-acetaminophen (NORCO) 10-325 MG tablet Take 1 tablet by mouth every 4 (four) hours as needed for moderate pain.   11/20/2016 at Unknown time  . lisinopril-hydrochlorothiazide (PRINZIDE,ZESTORETIC) 20-12.5 MG tablet Take 1 tablet by mouth daily.   11/20/2016 at Unknown time  . loperamide (IMODIUM) 2 MG capsule Take 1 capsule (2 mg total) by mouth 4 (four) times daily as needed for diarrhea or loose stools. 12 capsule 0 11/20/2016 at Unknown time  . metoCLOPramide (REGLAN) 10 MG tablet Take 1 tablet (10 mg total) by mouth every 6 (six) hours as needed for nausea (or headache). 30 tablet 0 11/20/2016 at Unknown time  . PARoxetine (PAXIL) 20 MG tablet Take 1 daily. This is to  prevent panic attacks (Patient taking differently: Take 40 mg by mouth daily. Take 1 daily. This is to prevent panic attacks) 30 tablet 12 11/19/2016 at Unknown time  . polyethylene glycol (MIRALAX / GLYCOLAX) packet Take 17 g by mouth daily. 14 each 0 Past Week at Unknown time  . torsemide (DEMADEX) 20 MG tablet Take 2 tablets (40 mg total) by mouth daily. 60 tablet 12 11/19/2016 at Unknown time  . atorvastatin (LIPITOR) 10 MG tablet Take 10 mg by mouth daily. Take along with 20 mg tablet to equal 30 mg daily.   Not Taking at Unknown time  . rOPINIRole (  REQUIP) 1 MG tablet Take 1 mg by mouth at bedtime.    Not Taking at Unknown time   Scheduled: . albuterol  2.5 mg Inhalation TID  . ALPRAZolam  1 mg Oral QID  . atorvastatin  10 mg Oral Daily  . atorvastatin  20 mg Oral Daily  . carvedilol  12.5 mg Oral BID WC  . chlorhexidine  15 mL Mouth Rinse BID  . colchicine  0.6 mg Oral Q48H  . estradiol  0.5 mg Oral Daily  . fluticasone  2 spray Each Nare QHS  . heparin  5,000 Units Subcutaneous Q8H  . hydrALAZINE  25 mg Oral Q8H  . lisinopril  20 mg Oral Daily   And  . hydrochlorothiazide  12.5 mg Oral Daily  . mouth rinse  15 mL Mouth Rinse q12n4p  . PARoxetine  40 mg Oral Daily  . polyethylene glycol  17 g Oral Daily  . risperiDONE  0.25 mg Oral BID  . sodium chloride flush  3 mL Intravenous Q12H  . torsemide  40 mg Oral Daily   Continuous: . sodium chloride 75 mL/hr at 11/23/16 0256   DXF:[id number], labetalol, loperamide, metoCLOPramide  Assesment:She was admitted with malignant hypertension. She has multiple other medical problems. She has been hallucinating. She was evaluated by psychiatry in the emergency department and it was felt that she did not need inpatient psychiatric care. She had CT of the brain yesterday that did not show a stroke. She has not been taking most of her medications at home according to her daughter. Her blood pressure is doing better but still not totally  controlled. She has chronic renal failure in some of the problem may be that she has uremia but her renal function is better than it was last admission. Principal Problem:   HTN (hypertension), malignant Active Problems:   Diabetes mellitus   Bipolar 1 disorder ([address])   Noncompliance with medication regimen   Panic attack   Confusion    Plan: Continue treatments. I'm going to go in and get an MRI. Request neurology consultation. Decrease Xanax    LOS: 1 day   Munachimso Palin L 11/23/2016, 10:22 AM

## 2016-11-24 ENCOUNTER — Inpatient Hospital Stay (HOSPITAL_COMMUNITY)
Admit: 2016-11-24 | Discharge: 2016-11-24 | Disposition: A | Discharge status: Home / Self Care | Payer: Medicaid Other | Attending: Pulmonary Disease | Admitting: Pulmonary Disease

## 2016-11-24 ENCOUNTER — Inpatient Hospital Stay (HOSPITAL_COMMUNITY): Payer: Medicaid Other

## 2016-11-24 LAB — COMPREHENSIVE METABOLIC PANEL
ALT: 37 U/L (ref 14–54)
AST: 17 U/L (ref 15–41)
Albumin: 3.6 g/dL (ref 3.5–5.0)
Alkaline Phosphatase: 70 U/L (ref 38–126)
Anion gap: 11 (ref 5–15)
BUN: 51 mg/dL — ABNORMAL HIGH (ref 6–20)
CHLORIDE: 112 mmol/L — AB (ref 101–111)
CO2: 22 mmol/L (ref 22–32)
Calcium: 9.2 mg/dL (ref 8.9–10.3)
Creatinine, Ser: 4.13 mg/dL — ABNORMAL HIGH (ref 0.44–1.00)
GFR, EST AFRICAN AMERICAN: 14 mL/min — AB (ref >60)
GFR, EST NON AFRICAN AMERICAN: 12 mL/min — AB (ref >60)
Glucose, Bld: 109 mg/dL — ABNORMAL HIGH (ref 65–99)
POTASSIUM: 3.9 mmol/L (ref 3.5–5.1)
Sodium: 145 mmol/L (ref 135–145)
Total Bilirubin: 0.6 mg/dL (ref 0.3–1.2)
Total Protein: 6.5 g/dL (ref 6.5–8.1)

## 2016-11-24 LAB — URINE CULTURE

## 2016-11-24 LAB — VITAMIN B12: Vitamin B-12: 438 pg/mL (ref 180–914)

## 2016-11-24 NOTE — Progress Notes (Signed)
EEG Completed; Results Pending  

## 2016-11-24 NOTE — Consult Note (Signed)
Telepsych Consultation   Reason for Consult: Confusion, Hallucinations  Referring Physician: Dr. Luan Pulling Patient Identification: Martha [name] MRN:  [id number] Principal Diagnosis: HTN (hypertension), malignant Diagnosis:   Patient Active Problem List   Diagnosis Date Noted  . Confusion [R41.0] 11/22/2016  . HTN (hypertension), malignant [I10] 11/20/2016  . Diabetes mellitus 11/20/2016  . Bipolar 1 disorder ([place]) [F31.9] 11/20/2016  . Noncompliance with medication regimen [Z91.14] 11/20/2016  . Panic attack [F41.0] 11/20/2016  . CKD stage 5 secondary to hypertension ([place]) [I12.0, N18.5] 11/15/2016  . AKI (acute kidney injury) ([place]) [N17.9] 11/15/2016  . Chest pain [R07.9] 10/27/2016  . CKD (chronic kidney disease) stage 5, GFR less than 15 ml/min (HCC) [N18.5] 10/27/2016  . Uncontrolled type 2 diabetes mellitus with hyperglycemia, without long-term current use of insulin ([place]) [E11.65] 10/27/2016  . Sensory disturbance [R20.9] 10/27/2016  . Left hemiparesis ([place]) [G81.94] 10/27/2016  . Acute on chronic renal failure ([place]) [N17.9, N18.9] 05/25/2016  . Abdominal pain [R10.9] 04/26/2014  . Cholelithiasis [K80.20] 04/26/2014  . Acute renal failure ([place]) [N17.9] 04/26/2014  . Dehydration [E86.0] 04/26/2014  . Hyponatremia [E87.1] 04/26/2014  . Gallstones [K80.20] 04/06/2014  . Uncontrolled diabetes mellitus ([place]) [E11.65] 10/09/2013  . Breast abscess [N61.1] 05/30/2013  . Diabetes mellitus, type 2 ([place]) [E11.9] 05/30/2013  . Hyperglycemia [R73.9] 05/30/2013  . Internal hemorrhoids with other complication [L38.1] 01/75/1025  . Umbilical hernia without mention of obstruction or gangrene [K42.9] 04/15/2013  . Anemia [D64.9] 04/15/2013  . DM [E11.9] 05/13/2010  . Hyperlipidemia [E78.5] 05/13/2010  . OBESITY [E66.9] 05/13/2010  . Depression [F32.9] 05/13/2010  . RESTLESS LEG SYNDROME [G25.81] 05/13/2010  . Essential hypertension [I10] 05/13/2010  . GERD [K21.9] 05/13/2010  . RECTAL  BLEEDING [K62.5] 05/13/2010  . Sleep apnea [G47.30] 05/13/2010  . Dysphagia, oropharyngeal phase [R13.12] 05/13/2010    Total Time spent with patient: 20 minutes  Subjective:   Martha [name] is a 51 y.o. female patient admitted with two day history of headache associated with extremity weakness and gait difficulty.   HPI:    Per initial assessment 11/20/2016 by Marinus Maw Counselor:   Martha [name] is an 51 y.o. female presenting with extremity weakness, described as tingling and numbness in her hand and foot. The patient has several medical diagnoses currently and has been treated for Bipolar in the past. The patient and her husband report last Sunday the patient fell on her left side. Since then she has not been able to walk on her own, had decreased appetite and fluid intake. Husband reports the past 2 weeks, the patient was taking only "baby steps", and was unable to keep her balance.  Most recently she has auditory and visual halluncinations, thinking she sees her deceased aunt and her daughter who was not in the room at the time.  No prior history of hallucinations. Denies SI or HI. Denies alcohol or drug use. Report daily panic attacks. No previous history of SI attempts. The patient was oriented to place, thought the day was Saturday and hesitated on the year, but responded with the correct year, 2018.   She not currently being treated by a psychiatrist but states she was when her children where growing up.  Reports symptoms of feeling upset and having crying spells in the past. The patient is primarily treated by her PCP, Dr. Luan Pulling.   She is prescribed Vicodin and Xanax. She denies any overuse of medication.  The patient did have slurred and rambling speech, appeared psychomotor slowed, had pleasant mood,  was cooperative, had partial judgment and insight. Reports feeling jittery saying, "I think I'm dehydrated." The patient was recently told she had low kidney function. The  patient is also diagnosed with hypertension and diabetes.   Per assessment 11/24/2016:   Patient appears very confused during the assessment today. 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 She is alert to time and place. She is very distracted during the assessment and has limited ability to answer assessment questions. When asked if she is having any hallucinations as the nursing staff reported stated "My friend's baby is in the room." Her fiance provided some collateral information stating "She started getting weak and having trouble walking earlier this year. Then she has been talking to people who are dead at home. She was not doing this earlier this year. I think that started in early April. I do not think that she has been seeing a Psychiatrist either." Her fiance reported that he has known the patient since 2012.   Per consultation with Dr. Parke Poisson patient's presentation is consistent with delirium (confusion, agitation, lack of attention span). Review of her current lab-work revealed a gfr of twelve. Patient's current kidney function is severely comprised. At this time Dr. Parke Poisson feels that her presentation is not consistent with a primary psychiatric diagnoses although it was noted that patient has psychiatric history of Bipolar Disorder. Dr. Parke Poisson recommends adjusting her current psychiatric medications to minimize confusion. The Risperdal may be increased to 0.5 mg bid for agitation. Dr. Parke Poisson feels that paxil should be decreased to 20 mg and xanax should be tapered by 0.25 mg daily until patient is weaned off due potential side effects of these medications that could worsen her confusion. It is felt that at this time her symptoms are more consistent with a metabolic origin than of primary psychiatric diagnosis.   Past Psychiatric History: Bipolar Depression   Risk to Self: Suicidal Ideation:  No Suicidal Intent: No Is patient at risk for suicide?: No Suicidal Plan?: No Access to Means: No What has been your use of drugs/alcohol within the last 12 months?: n/a How many times?: 0 Other Self Harm Risks: 0 Intentional Self Injurious Behavior: None Risk to Others: Homicidal Ideation: No Thoughts of Harm to Others: No Current Homicidal Intent: No Current Homicidal Plan: No Access to Homicidal Means: No History of harm to others?: No Assessment of Violence: None Noted Does patient have access to weapons?: No Criminal Charges Pending?: No Does patient have a court date: No Prior Inpatient Therapy: Prior Inpatient Therapy: No Prior Outpatient Therapy: Prior Outpatient Therapy: No Does patient have an ACCT team?: No Does patient have Intensive In-House Services?  : No Does patient have [facility] services? : No Does patient have P4CC services?: No  Past Medical History:  Past Medical History:  Diagnosis Date  . Acid reflux   . Anemia   . Arthritis   . Bipolar 1 disorder ([place])   . Cervical radiculopathy   . COPD (chronic obstructive pulmonary disease) ([place]) 2014   bronchitis  . Degenerative disc disease, thoracic   . Depression   . Diabetes mellitus   . Hypertension   . Noncompliance with medication regimen   . Obesity (BMI 30-39.9)   . OSA (obstructive sleep apnea)    cpap  . Panic attack   . RLS (restless legs syndrome)     Past Surgical History:  Procedure  Laterality Date  . BIOPSY N/A 05/17/2013   Procedure: BIOPSY;  Surgeon: Sandi L Fields, MD;  Location: [facility];  Service: Endoscopy;  Laterality: N/A;  . CHOLECYSTECTOMY N/A 04/27/2014   Procedure: LAPAROSCOPIC CHOLECYSTECTOMY;  Surgeon: William S Bradford, MD;  Location: [facility];  Service: General;  Laterality: N/A;  . COLONOSCOPY  06/06/2010   SLF:rectal bleeding secondary to internal hemorrhoids but incomplete evaluation secondary to poor right colon prep/small rectal and sigmoid colon polyps (hyperplastic).  PROPOFOL  . COLONOSCOPY  May 2013   Dr. Gilliam/[facility]: 5 mm a descending colon polyp, hyperplastic. Adequate bowel prep.  . ENDOMETRIAL ABLATION    . ESOPHAGOGASTRODUODENOSCOPY  06/06/2010   SLF:diffuse erythema and edema of body of stomach, with sessile polypoid lesions. bx benign. no h.pylori  . ESOPHAGOGASTRODUODENOSCOPY (EGD) WITH PROPOFOL N/A 05/17/2013   Dr. Fields: normal esophagus, moderate nodular gastritis, negative path, empiric Savary dilation  . FLEXIBLE SIGMOIDOSCOPY N/A 05/17/2013   Dr. Fields: moderate sized internal hemorrhoids  . HEMORRHOID BANDING N/A 05/17/2013   Procedure: HEMORRHOID BANDING;  Surgeon: Sandi L Fields, MD;  Location: [facility];  Service: Endoscopy;  Laterality: N/A;  2 bands placed  . INCISION AND DRAINAGE ABSCESS Left 10/11/2013   Procedure: INCISION AND DRAINAGE AND DEBRIDEMENT LEFT BREAST  ABSCESS;  Surgeon: William S Bradford, MD;  Location: [facility];  Service: General;  Laterality: Left;  . IRRIGATION AND DEBRIDEMENT ABSCESS Right 06/01/2013   Procedure: INCISION AND DRAINAGE AND DEBRIDEMENT ABSCESS RIGHT BREAST;  Surgeon: William S Bradford, MD;  Location: [facility];  Service: General;  Laterality: Right;  . SAVORY DILATION N/A 05/17/2013   Procedure: SAVORY DILATION;  Surgeon: Sandi L Fields, MD;  Location: [facility];  Service: Endoscopy;  Laterality: N/A;  14/15/16  . TUBAL LIGATION     Family History:  Family History  Problem Relation Age of Onset  . Adopted: Yes  . Family history unknown: Yes   Family Psychiatric  History: Unknown did not answer due to confusion  Social History:  History  Alcohol Use No     History  Drug Use No    Social History   Social History  . Marital status: Divorced    Spouse name: N/A  . Number of children: 2  . Years of education: N/A   Occupational History  . Disabled    Social History Main Topics  . Smoking status: Former Smoker    Packs/day: 1.50    Years: 25.00    Types: Cigarettes    Quit date: 10/23/2007   . Smokeless tobacco: Former User     Comment: Quit x 6 years ago  . Alcohol use No  . Drug use: No  . Sexual activity: Yes    Birth control/ protection: Surgical   Other Topics Concern  . None   Social History Narrative   Lives with fiance.     Additional Social History:    Allergies:   Allergies  Allergen Reactions  . Codeine Nausea And Vomiting    Labs:  Results for orders placed or performed during the hospital encounter of 11/20/16 (from the past 48 hour(s))  Urine culture     Status: Abnormal   Collection Time: 11/22/16 10:55 PM  Result Value Ref Range   Specimen Description URINE, CLEAN CATCH    Special Requests NONE    Culture MULTIPLE SPECIES PRESENT, SUGGEST RECOLLECTION (A)    Report Status 11/24/2016 FINAL   Basic metabolic panel     Status: Abnormal   Collection Time:   11/23/16  5:38 AM  Result Value Ref Range   Sodium 144 135 - 145 mmol/L   Potassium 3.5 3.5 - 5.1 mmol/L   Chloride 111 101 - 111 mmol/L   CO2 23 22 - 32 mmol/L   Glucose, Bld 108 (H) 65 - 99 mg/dL   BUN 50 (H) 6 - 20 mg/dL   Creatinine, Ser 4.14 (H) 0.44 - 1.00 mg/dL   Calcium 9.2 8.9 - 10.3 mg/dL   GFR calc non Af Amer 12 (L) >60 mL/min   GFR calc Af Amer 14 (L) >60 mL/min    Comment: (NOTE) The eGFR has been calculated using the CKD EPI equation. This calculation has not been validated in all clinical situations. eGFR's persistently <60 mL/min signify possible Chronic Kidney Disease.    Anion gap 10 5 - 15  Ammonia     Status: None   Collection Time: 11/23/16  5:38 AM  Result Value Ref Range   Ammonia 16 9 - 35 umol/L  CBC with Differential/Platelet     Status: Abnormal   Collection Time: 11/23/16  5:38 AM  Result Value Ref Range   WBC 4.8 4.0 - 10.5 K/uL   RBC 4.04 3.87 - 5.11 MIL/uL   Hemoglobin 11.3 (L) 12.0 - 15.0 g/dL   HCT 34.9 (L) 36.0 - 46.0 %   MCV 86.4 78.0 - 100.0 fL   MCH 28.0 26.0 - 34.0 pg   MCHC 32.4 30.0 - 36.0 g/dL   RDW 14.9 11.5 - 15.5 %   Platelets  145 (L) 150 - 400 K/uL   Neutrophils Relative % 42 %   Neutro Abs 2.0 1.7 - 7.7 K/uL   Lymphocytes Relative 43 %   Lymphs Abs 2.1 0.7 - 4.0 K/uL   Monocytes Relative 11 %   Monocytes Absolute 0.5 0.1 - 1.0 K/uL   Eosinophils Relative 3 %   Eosinophils Absolute 0.2 0.0 - 0.7 K/uL   Basophils Relative 1 %   Basophils Absolute 0.0 0.0 - 0.1 K/uL  Vitamin B12     Status: None   Collection Time: 11/23/16  5:38 AM  Result Value Ref Range   Vitamin B-12 438 180 - 914 pg/mL    Comment: (NOTE) This assay is not validated for testing neonatal or myeloproliferative syndrome specimens for Vitamin B12 levels. Performed at [facility], [address] [place], [place] [address]   Comprehensive metabolic panel     Status: Abnormal   Collection Time: 11/24/16 10:05 AM  Result Value Ref Range   Sodium 145 135 - 145 mmol/L   Potassium 3.9 3.5 - 5.1 mmol/L   Chloride 112 (H) 101 - 111 mmol/L   CO2 22 22 - 32 mmol/L   Glucose, Bld 109 (H) 65 - 99 mg/dL   BUN 51 (H) 6 - 20 mg/dL   Creatinine, Ser 4.13 (H) 0.44 - 1.00 mg/dL   Calcium 9.2 8.9 - 10.3 mg/dL   Total Protein 6.5 6.5 - 8.1 g/dL   Albumin 3.6 3.5 - 5.0 g/dL   AST 17 15 - 41 U/L   ALT 37 14 - 54 U/L   Alkaline Phosphatase 70 38 - 126 U/L   Total Bilirubin 0.6 0.3 - 1.2 mg/dL   GFR calc non Af Amer 12 (L) >60 mL/min   GFR calc Af Amer 14 (L) >60 mL/min    Comment: (NOTE) The eGFR has been calculated using the CKD EPI equation. This calculation has not been validated in all clinical   situations. eGFR's persistently <60 mL/min signify possible Chronic Kidney Disease.    Anion gap 11 5 - 15    Current Facility-Administered Medications  Medication Dose Route Frequency Provider Last Rate Last Dose  . 0.9 %  sodium chloride infusion   Intravenous Continuous Edward Hawkins, MD 75 mL/hr at 11/23/16 0256    . albuterol (PROVENTIL) (2.5 MG/3ML) 0.083% nebulizer solution 2.5 mg  2.5 mg Inhalation TID Peter Le, MD   2.5 mg at 11/24/16  1425  . albuterol (PROVENTIL) (2.5 MG/3ML) 0.083% nebulizer solution 3 mL  3 mL Inhalation Q4H PRN Peter Le, MD      . ALPRAZolam (XANAX) tablet 0.5 mg  0.5 mg Oral QID Edward Hawkins, MD   0.5 mg at 11/24/16 1356  . atorvastatin (LIPITOR) tablet 10 mg  10 mg Oral Daily Peter Le, MD   10 mg at 11/24/16 0920  . atorvastatin (LIPITOR) tablet 20 mg  20 mg Oral Daily Peter Le, MD   20 mg at 11/24/16 0922  . carvedilol (COREG) tablet 12.5 mg  12.5 mg Oral BID WC Peter Le, MD   12.5 mg at 11/24/16 0921  . chlorhexidine (PERIDEX) 0.12 % solution 15 mL  15 mL Mouth Rinse BID Peter Le, MD   15 mL at 11/24/16 0922  . colchicine tablet 0.6 mg  0.6 mg Oral Q48H Peter Le, MD   0.6 mg at 11/23/16 1030  . estradiol (ESTRACE) tablet 0.5 mg  0.5 mg Oral Daily Peter Le, MD   0.5 mg at 11/24/16 0920  . fluticasone (FLONASE) 50 MCG/ACT nasal spray 2 spray  2 spray Each Nare QHS Peter Le, MD   2 spray at 11/23/16 2221  . heparin injection 5,000 Units  5,000 Units Subcutaneous Q8H Peter Le, MD   5,000 Units at 11/24/16 1357  . hydrALAZINE (APRESOLINE) tablet 25 mg  25 mg Oral Q8H Peter Le, MD   25 mg at 11/24/16 1357  . lisinopril (PRINIVIL,ZESTRIL) tablet 20 mg  20 mg Oral Daily Edward Hawkins, MD   20 mg at 11/24/16 0921   And  . hydrochlorothiazide (MICROZIDE) capsule 12.5 mg  12.5 mg Oral Daily Edward Hawkins, MD   12.5 mg at 11/24/16 0920  . labetalol (NORMODYNE,TRANDATE) injection 10 mg  10 mg Intravenous Q2H PRN Peter Le, MD      . loperamide (IMODIUM) capsule 2 mg  2 mg Oral QID PRN Peter Le, MD      . MEDLINE mouth rinse  15 mL Mouth Rinse q12n4p Peter Le, MD   15 mL at 11/21/16 1600  . metoCLOPramide (REGLAN) tablet 10 mg  10 mg Oral Q6H PRN Peter Le, MD      . PARoxetine (PAXIL) tablet 40 mg  40 mg Oral Daily Peter Le, MD   40 mg at 11/24/16 0921  . polyethylene glycol (MIRALAX / GLYCOLAX) packet 17 g  17 g Oral Daily Peter Le, MD   17 g at 11/24/16 0920  . risperiDONE (RISPERDAL) tablet 0.25 mg  0.25 mg  Oral BID Edward Hawkins, MD   0.25 mg at 11/24/16 0921  . sodium chloride flush (NS) 0.9 % injection 3 mL  3 mL Intravenous Q12H Peter Le, MD   3 mL at 11/24/16 0922  . torsemide (DEMADEX) tablet 40 mg  40 mg Oral Daily Peter Le, MD   40 mg at 11/24/16 0920    Musculoskeletal:  Unable to assess via camera   Psychiatric Specialty Exam: Physical Exam  Review of   Systems  Psychiatric/Behavioral: Positive for hallucinations. The patient is nervous/anxious.     Blood pressure (!) 154/101, pulse 89, temperature 97.5 F (36.4 C), temperature source Oral, resp. rate 20, height 5' 8" (1.727 m), weight 117.3 kg (258 lb 9.6 oz), SpO2 94 %.Body mass index is 39.32 kg/m.  General Appearance: Disheveled  Eye Contact:  Minimal  Speech:  Clear and Coherent and Garbled at times   Volume:  Decreased  Mood:  Anxious  Affect:  Congruent  Thought Process:  Disorganized  Orientation:  Other:  Oriented to time and place   Thought Content:  Illogical and Hallucinations: Visual  Suicidal Thoughts:  No  Homicidal Thoughts:  No  Memory:  Immediate;   Poor Recent;   Poor Remote;   Poor  Judgement:  Other:  Unable to assess due to confusion   Insight:  Lacking  Psychomotor Activity:  Restlessness  Concentration:  Concentration: Poor and Attention Span: Poor  Recall:  Poor  Fund of Knowledge:  Unable to assess due to confusion   Language:  Fair  Akathisia:  No  Handed:  Right  AIMS (if indicated):     Assets:  Communication Skills Desire for Improvement Housing Intimacy Leisure Time Resilience  ADL's:  Intact  Cognition:  Impaired,  Moderate  Sleep:        Treatment Plan Summary: Per consultation with Dr. Cobos the following recommendations have been made:  1) Decrease Paxil to 20 mg daily due to potential for anticholinergic side effects, which could be made worse due to her current poor kidney function  2) Increase Risperdal to 0.5 mg BID for acute agitation  3) Consider gradual taper  of xanax as this particular medication can worsen confusion to taper by 0. 25 mg daily then stop.   Disposition: No evidence of imminent risk to self or others at present.   Patient does not meet criteria for psychiatric inpatient admission. Supportive therapy provided about ongoing stressors. Continue with current management of multiple medical problems. Note from Neurology was reviewed from Dr. [place] from 11/24/2016.   , , NP 11/24/2016 3:59 PM       

## 2016-11-24 NOTE — Consult Note (Signed)
Martha A. Merlene Laughter, MD     [email or web address]          Martha [name] is an 51 y.o. female.   ASSESSMENT/PLAN: Unexplained encephalopathy but the picture seems most consistent with the toxic metabolic processes most likely from worsening renal failure. I agree with the current workup. We will follow the patient's MRI and EEG. I will check the patient's vitamin B12 level and add thiamine. Minimize the use of psychotropic medications.  Tremors which are likely due to toxic metabolic processes.  Apraxia of eyelid opening suggestive of right parietal hemispheric dysfunction.   The patient is a 51 year old white female who presents to the hospital with altered mental status and the hypersomnia. The history is obtained from the patient, the chart and the physicians. The daughter reports that she has been quite drowsy sleeping all day long for several days even before her last hospitalization. She was noted to have accelerated blood pressure which admitted to the hospital this time around. She was on Cardizem drip which was switched to another agent afterwards. Blood pressure was in the 220s/115. The patient apparently has a baseline history of bipolar disorder. In addition to the unresponsiveness and altered mental status, the patient has been having hallucinations. The patient was seen by telling her psychiatrist and apparently was clear and during this admission. The patient was seen in the hospital over a week ago and was discharged just a few days ago. She was admitted to the hospital primarily at that time with headaches. Blood pressure was elevated in the 180s-190/100. The review of systems is very limited given the confusion. The daughter reports that she has been having difficulties opening her eyes.  GENERAL: Obese female who currently is in no acute distress.  HEENT: Supple. Atraumatic normocephalic.   ABDOMEN: soft  EXTREMITIES: No edema; there is possible  asterisis noted of the upper extremities   BACK: Normal.  SKIN: Normal by inspection.    MENTAL STATUS: She lays in bed with eyes closed. The patient open her eyes partially to light sternal rub. She clearly seemed to have difficulties opening her eyes even though she appears to be awake at times during the evaluation. She does follow midline and appendicular commands. She mumbles a few words sentences which are mostly comprehendible although she clearly seemed to have some dysarthria.  CRANIAL NERVES: Pupils are equal, round and reactive to light and accommodation; extra ocular movements are full, there appears to be significant jerky pursuits with eye movements; visual fields are unreliable; upper and lower facial muscles are normal in strength and symmetric, there is no flattening of the nasolabial folds; tongue is midline; uvula is midline; shoulder elevation is normal.  MOTOR: Normal tone, bulk and strength in arms and 3/5 legs; no pronator drift.   COORDINATION: Left finger to nose is normal, right finger to nose is normal; she does have a moderate frequency moderate amplitude rest tremor and postural tremors. No rigidity or bradykinesia appreciated.  REFLEXES: Deep tendon reflexes are symmetrical and normal. Babinski reflexes are flexor bilaterally.   SENSATION: Normal to light touch and pain.         Blood pressure (!) 154/101, pulse 89, temperature 97.5 F (36.4 C), temperature source Oral, resp. rate 20, height '5\' 8"'$  (1.727 m), weight 258 lb 9.6 oz (117.3 kg), SpO2 96 %.  Past Medical History:  Diagnosis Date  . Acid reflux   . Anemia   . Arthritis   . Bipolar 1 disorder ([place])   .  Cervical radiculopathy   . COPD (chronic obstructive pulmonary disease) (HCC) 2014   bronchitis  . Degenerative disc disease, thoracic   . Depression   . Diabetes mellitus   . Hypertension   . Noncompliance with medication regimen   . Obesity (BMI 30-39.9)   . OSA (obstructive sleep  apnea)    cpap  . Panic attack   . RLS (restless legs syndrome)     Past Surgical History:  Procedure Laterality Date  . BIOPSY N/A 05/17/2013   Procedure: BIOPSY;  Surgeon: West Bali, MD;  Location: [facility];  Service: Endoscopy;  Laterality: N/A;  . CHOLECYSTECTOMY N/A 04/27/2014   Procedure: LAPAROSCOPIC CHOLECYSTECTOMY;  Surgeon: Marlane Hatcher, MD;  Location: [facility];  Service: General;  Laterality: N/A;  . COLONOSCOPY  06/06/2010   RCB:GMJBCC bleeding secondary to internal hemorrhoids but incomplete evaluation secondary to poor right colon prep/small rectal and sigmoid colon polyps (hyperplastic). PROPOFOL  . COLONOSCOPY  May 2013   Dr. Gilliam/[facility]: 5 mm a descending colon polyp, hyperplastic. Adequate bowel prep.  . ENDOMETRIAL ABLATION    . ESOPHAGOGASTRODUODENOSCOPY  06/06/2010   WSF:CZUIVGT erythema and edema of body of stomach, with sessile polypoid lesions. bx benign. no h.pylori  . ESOPHAGOGASTRODUODENOSCOPY (EGD) WITH PROPOFOL N/A 05/17/2013   Dr. Darrick Penna: normal esophagus, moderate nodular gastritis, negative path, empiric Savary dilation  . FLEXIBLE SIGMOIDOSCOPY N/A 05/17/2013   Dr. Darrick Penna: moderate sized internal hemorrhoids  . HEMORRHOID BANDING N/A 05/17/2013   Procedure: HEMORRHOID BANDING;  Surgeon: West Bali, MD;  Location: [facility];  Service: Endoscopy;  Laterality: N/A;  2 bands placed  . INCISION AND DRAINAGE ABSCESS Left 10/11/2013   Procedure: INCISION AND DRAINAGE AND DEBRIDEMENT LEFT BREAST  ABSCESS;  Surgeon: Marlane Hatcher, MD;  Location: [facility];  Service: General;  Laterality: Left;  . IRRIGATION AND DEBRIDEMENT ABSCESS Right 06/01/2013   Procedure: INCISION AND DRAINAGE AND DEBRIDEMENT ABSCESS RIGHT BREAST;  Surgeon: Marlane Hatcher, MD;  Location: [facility];  Service: General;  Laterality: Right;  . SAVORY DILATION N/A 05/17/2013   Procedure: SAVORY DILATION;  Surgeon: West Bali, MD;  Location: [facility];  Service: Endoscopy;  Laterality:  N/A;  14/15/16  . TUBAL LIGATION      Family History  Problem Relation Age of Onset  . Adopted: Yes  . Family history unknown: Yes    Social History:  reports that she quit smoking about 9 years ago. Her smoking use included Cigarettes. She has a 37.50 pack-year smoking history. She has quit using smokeless tobacco. She reports that she does not drink alcohol or use drugs.  Allergies:  Allergies  Allergen Reactions  . Codeine Nausea And Vomiting    Medications: Prior to Admission medications   Medication Sig Start Date End Date Taking? Authorizing Provider  albuterol (PROVENTIL HFA;VENTOLIN HFA) 108 (90 Base) MCG/ACT inhaler Inhale 2 puffs into the lungs every 4 (four) hours as needed for wheezing or shortness of breath. 07/23/16  Yes Samuel Jester, DO  albuterol (PROVENTIL) (2.5 MG/3ML) 0.083% nebulizer solution Inhale 3 mLs (2.5 mg total) into the lungs 3 (three) times daily. 10/29/16  Yes Kari Baars, MD  ALPRAZolam Prudy Feeler) 1 MG tablet Take 1 mg by mouth 4 (four) times daily.     Yes Historical Provider, MD  atorvastatin (LIPITOR) 20 MG tablet Take 20 mg by mouth daily. Take along with 10 mg tablet to equal 30 mg daily.   Yes Historical Provider, MD  baclofen (LIORESAL) 20 MG tablet  Take 20 mg by mouth 3 (three) times daily as needed for muscle spasms.   Yes Historical Provider, MD  carvedilol (COREG) 12.5 MG tablet Take 1 tablet (12.5 mg total) by mouth 2 (two) times daily with a meal. 05/26/16  Yes Cherene Altes, MD  colchicine 0.6 MG tablet Take 1 tablet (0.6 mg total) by mouth every other day. 11/19/16  Yes Sinda Du, MD  estradiol (ESTRACE) 0.5 MG tablet Take 0.5 mg by mouth daily.   Yes Historical Provider, MD  fluticasone (FLONASE) 50 MCG/ACT nasal spray Place 2 sprays into both nostrils at bedtime.   Yes Historical Provider, MD  hydrALAZINE (APRESOLINE) 25 MG tablet Take 1 tablet (25 mg total) by mouth every 8 (eight) hours. 10/29/16  Yes Sinda Du, MD    HYDROcodone-acetaminophen (NORCO) 10-325 MG tablet Take 1 tablet by mouth every 4 (four) hours as needed for moderate pain.   Yes Historical Provider, MD  lisinopril-hydrochlorothiazide (PRINZIDE,ZESTORETIC) 20-12.5 MG tablet Take 1 tablet by mouth daily.   Yes Historical Provider, MD  loperamide (IMODIUM) 2 MG capsule Take 1 capsule (2 mg total) by mouth 4 (four) times daily as needed for diarrhea or loose stools. 10/23/16  Yes Evalee Jefferson, PA-C  metoCLOPramide (REGLAN) 10 MG tablet Take 1 tablet (10 mg total) by mouth every 6 (six) hours as needed for nausea (or headache). 02/02/92  Yes Delora Fuel, MD  PARoxetine (PAXIL) 20 MG tablet Take 1 daily. This is to prevent panic attacks Patient taking differently: Take 40 mg by mouth daily. Take 1 daily. This is to prevent panic attacks 10/28/16  Yes Sinda Du, MD  polyethylene glycol [facility] / GLYCOLAX) packet Take 17 g by mouth daily. 10/30/16  Yes Sinda Du, MD  torsemide (DEMADEX) 20 MG tablet Take 2 tablets (40 mg total) by mouth daily. 11/19/16  Yes Sinda Du, MD  atorvastatin (LIPITOR) 10 MG tablet Take 10 mg by mouth daily. Take along with 20 mg tablet to equal 30 mg daily.    Historical Provider, MD  rOPINIRole (REQUIP) 1 MG tablet Take 1 mg by mouth at bedtime.     Historical Provider, MD    Scheduled Meds: . albuterol  2.5 mg Inhalation TID  . ALPRAZolam  0.5 mg Oral QID  . atorvastatin  10 mg Oral Daily  . atorvastatin  20 mg Oral Daily  . carvedilol  12.5 mg Oral BID WC  . chlorhexidine  15 mL Mouth Rinse BID  . colchicine  0.6 mg Oral Q48H  . estradiol  0.5 mg Oral Daily  . fluticasone  2 spray Each Nare QHS  . heparin  5,000 Units Subcutaneous Q8H  . hydrALAZINE  25 mg Oral Q8H  . lisinopril  20 mg Oral Daily   And  . hydrochlorothiazide  12.5 mg Oral Daily  . mouth rinse  15 mL Mouth Rinse q12n4p  . PARoxetine  40 mg Oral Daily  . polyethylene glycol  17 g Oral Daily  . risperiDONE  0.25 mg Oral BID  . sodium  chloride flush  3 mL Intravenous Q12H  . torsemide  40 mg Oral Daily   Continuous Infusions: . sodium chloride 75 mL/hr at 11/23/16 0256   PRN Meds:.albuterol, labetalol, loperamide, metoCLOPramide     Results for orders placed or performed during the hospital encounter of 11/20/16 (from the past 48 hour(s))  Basic metabolic panel     Status: Abnormal   Collection Time: 11/23/16  5:38 AM  Result Value Ref Range  Sodium 144 135 - 145 mmol/L   Potassium 3.5 3.5 - 5.1 mmol/L   Chloride 111 101 - 111 mmol/L   CO2 23 22 - 32 mmol/L   Glucose, Bld 108 (H) 65 - 99 mg/dL   BUN 50 (H) 6 - 20 mg/dL   Creatinine, Ser 4.14 (H) 0.44 - 1.00 mg/dL   Calcium 9.2 8.9 - 10.3 mg/dL   GFR calc non Af Amer 12 (L) >60 mL/min   GFR calc Af Amer 14 (L) >60 mL/min    Comment: (NOTE) The eGFR has been calculated using the CKD EPI equation. This calculation has not been validated in all clinical situations. eGFR's persistently <60 mL/min signify possible Chronic Kidney Disease.    Anion gap 10 5 - 15  Ammonia     Status: None   Collection Time: 11/23/16  5:38 AM  Result Value Ref Range   Ammonia 16 9 - 35 umol/L  CBC with Differential/Platelet     Status: Abnormal   Collection Time: 11/23/16  5:38 AM  Result Value Ref Range   WBC 4.8 4.0 - 10.5 K/uL   RBC 4.04 3.87 - 5.11 MIL/uL   Hemoglobin 11.3 (L) 12.0 - 15.0 g/dL   HCT 34.9 (L) 36.0 - 46.0 %   MCV 86.4 78.0 - 100.0 fL   MCH 28.0 26.0 - 34.0 pg   MCHC 32.4 30.0 - 36.0 g/dL   RDW 14.9 11.5 - 15.5 %   Platelets 145 (L) 150 - 400 K/uL   Neutrophils Relative % 42 %   Neutro Abs 2.0 1.7 - 7.7 K/uL   Lymphocytes Relative 43 %   Lymphs Abs 2.1 0.7 - 4.0 K/uL   Monocytes Relative 11 %   Monocytes Absolute 0.5 0.1 - 1.0 K/uL   Eosinophils Relative 3 %   Eosinophils Absolute 0.2 0.0 - 0.7 K/uL   Basophils Relative 1 %   Basophils Absolute 0.0 0.0 - 0.1 K/uL    Studies/Results:     Markees Carns A. Merlene [name], M.D.  Diplomate, Tax adviser of  Psychiatry and Neurology ( Neurology). 11/24/2016, 7:56 AM

## 2016-11-24 NOTE — Progress Notes (Signed)
Patient is still confused and having hallucinations.

## 2016-11-24 NOTE — Progress Notes (Signed)
Subjective: She is awake and alert but still hallucinating. She's not eating very well unless her daughter feeds her. EEG is being performed in the next few minutes.  Objective: Vital signs in last 24 hours: Temp:  [97.5 F (36.4 C)-97.9 F (36.6 C)] 97.5 F (36.4 C) (04/30 0513) Pulse Rate:  [73-89] 89 (04/30 0513) Resp:  [18-20] 20 (04/30 0513) BP: (136-166)/(78-101) 154/101 (04/30 0513) SpO2:  [93 %-97 %] 96 % (04/30 0513) Weight change:  Last BM Date: 11/22/16  Intake/Output from previous day: 04/29 0701 - 04/30 0700 In: 123 [P.O.:120; I.V.:3] Out: -   PHYSICAL EXAM General appearance: alert and Hallucinating Resp: clear to auscultation bilaterally Cardio: regular rate and rhythm, S1, S2 normal, no murmur, click, rub or gallop GI: soft, non-tender; bowel sounds normal; no masses,  no organomegaly Extremities: extremities normal, atraumatic, no cyanosis or edema Skin warm and dry. No focal neurological abnormalities  Lab Results:  Results for orders placed or performed during the hospital encounter of 11/20/16 (from the past 48 hour(s))  Basic metabolic panel     Status: Abnormal   Collection Time: 11/23/16  5:38 AM  Result Value Ref Range   Sodium 144 135 - 145 mmol/L   Potassium 3.5 3.5 - 5.1 mmol/L   Chloride 111 101 - 111 mmol/L   CO2 23 22 - 32 mmol/L   Glucose, Bld 108 (H) 65 - 99 mg/dL   BUN 50 (H) 6 - 20 mg/dL   Creatinine, Ser 4.14 (H) 0.44 - 1.00 mg/dL   Calcium 9.2 8.9 - 10.3 mg/dL   GFR calc non Af Amer 12 (L) >60 mL/min   GFR calc Af Amer 14 (L) >60 mL/min    Comment: (NOTE) The eGFR has been calculated using the CKD EPI equation. This calculation has not been validated in all clinical situations. eGFR's persistently <60 mL/min signify possible Chronic Kidney Disease.    Anion gap 10 5 - 15  Ammonia     Status: None   Collection Time: 11/23/16  5:38 AM  Result Value Ref Range   Ammonia 16 9 - 35 umol/L  CBC with Differential/Platelet     Status:  Abnormal   Collection Time: 11/23/16  5:38 AM  Result Value Ref Range   WBC 4.8 4.0 - 10.5 K/uL   RBC 4.04 3.87 - 5.11 MIL/uL   Hemoglobin 11.3 (L) 12.0 - 15.0 g/dL   HCT 34.9 (L) 36.0 - 46.0 %   MCV 86.4 78.0 - 100.0 fL   MCH 28.0 26.0 - 34.0 pg   MCHC 32.4 30.0 - 36.0 g/dL   RDW 14.9 11.5 - 15.5 %   Platelets 145 (L) 150 - 400 K/uL   Neutrophils Relative % 42 %   Neutro Abs 2.0 1.7 - 7.7 K/uL   Lymphocytes Relative 43 %   Lymphs Abs 2.1 0.7 - 4.0 K/uL   Monocytes Relative 11 %   Monocytes Absolute 0.5 0.1 - 1.0 K/uL   Eosinophils Relative 3 %   Eosinophils Absolute 0.2 0.0 - 0.7 K/uL   Basophils Relative 1 %   Basophils Absolute 0.0 0.0 - 0.1 K/uL    ABGS No results for input(s): PHART, PO2ART, TCO2, HCO3 in the last 72 hours.  Invalid input(s): PCO2 CULTURES No results found for this or any previous visit (from the past 240 hour(s)). Studies/Results: Ct Head Wo Contrast  Result Date: 11/22/2016 CLINICAL DATA:  Confusion with hallucinations EXAM: CT HEAD WITHOUT CONTRAST TECHNIQUE: Contiguous axial images were obtained from  the base of the skull through the vertex without intravenous contrast. COMPARISON:  November 15, 2016 FINDINGS: Brain: The ventricles are normal in size and configuration. There is no intracranial mass, hemorrhage, extra-axial fluid collection, or midline shift. The gray-white compartments are normal. No acute infarct evident. Vascular: No hyperdense vessel. There is calcification in the left carotid siphon region. Skull: Bony calvarium appears intact. Sinuses/Orbits: Visualized paranasal sinuses are clear. Orbits appear symmetric bilaterally. Other: Visualized mastoid air cells are clear. IMPRESSION: Slight calcification in the left carotid siphon. No intracranial mass, hemorrhage, or extra-axial fluid collection. Gray-white compartments are normal. Electronically Signed   By: Lowella Grip III M.D.   On: 11/22/2016 14:38    Medications:  Prior to  Admission:  Prescriptions Prior to Admission  Medication Sig Dispense Refill Last Dose  . albuterol (PROVENTIL HFA;VENTOLIN HFA) 108 (90 Base) MCG/ACT inhaler Inhale 2 puffs into the lungs every 4 (four) hours as needed for wheezing or shortness of breath. 1 Inhaler 0 11/20/2016 at Unknown time  . albuterol (PROVENTIL) (2.5 MG/3ML) 0.083% nebulizer solution Inhale 3 mLs (2.5 mg total) into the lungs 3 (three) times daily. 75 mL 12 11/20/2016 at Unknown time  . ALPRAZolam (XANAX) 1 MG tablet Take 1 mg by mouth 4 (four) times daily.     11/20/2016 at Unknown time  . atorvastatin (LIPITOR) 20 MG tablet Take 20 mg by mouth daily. Take along with 10 mg tablet to equal 30 mg daily.   11/19/2016 at Unknown time  . baclofen (LIORESAL) 20 MG tablet Take 20 mg by mouth 3 (three) times daily as needed for muscle spasms.   11/19/2016 at Unknown time  . carvedilol (COREG) 12.5 MG tablet Take 1 tablet (12.5 mg total) by mouth 2 (two) times daily with a meal. 60 tablet 0 11/20/2016 at 1000  . colchicine 0.6 MG tablet Take 1 tablet (0.6 mg total) by mouth every other day. 16 tablet 12   . estradiol (ESTRACE) 0.5 MG tablet Take 0.5 mg by mouth daily.     . fluticasone (FLONASE) 50 MCG/ACT nasal spray Place 2 sprays into both nostrils at bedtime.   11/19/2016 at Unknown time  . hydrALAZINE (APRESOLINE) 25 MG tablet Take 1 tablet (25 mg total) by mouth every 8 (eight) hours. 90 tablet 12 11/20/2016 at Unknown time  . HYDROcodone-acetaminophen (NORCO) 10-325 MG tablet Take 1 tablet by mouth every 4 (four) hours as needed for moderate pain.   11/20/2016 at Unknown time  . lisinopril-hydrochlorothiazide (PRINZIDE,ZESTORETIC) 20-12.5 MG tablet Take 1 tablet by mouth daily.   11/20/2016 at Unknown time  . loperamide (IMODIUM) 2 MG capsule Take 1 capsule (2 mg total) by mouth 4 (four) times daily as needed for diarrhea or loose stools. 12 capsule 0 11/20/2016 at Unknown time  . metoCLOPramide (REGLAN) 10 MG tablet Take 1 tablet (10 mg  total) by mouth every 6 (six) hours as needed for nausea (or headache). 30 tablet 0 11/20/2016 at Unknown time  . PARoxetine (PAXIL) 20 MG tablet Take 1 daily. This is to prevent panic attacks (Patient taking differently: Take 40 mg by mouth daily. Take 1 daily. This is to prevent panic attacks) 30 tablet 12 11/19/2016 at Unknown time  . polyethylene glycol (MIRALAX / GLYCOLAX) packet Take 17 g by mouth daily. 14 each 0 Past Week at Unknown time  . torsemide (DEMADEX) 20 MG tablet Take 2 tablets (40 mg total) by mouth daily. 60 tablet 12 11/19/2016 at Unknown time  . atorvastatin (LIPITOR) 10  MG tablet Take 10 mg by mouth daily. Take along with 20 mg tablet to equal 30 mg daily.   Not Taking at Unknown time  . rOPINIRole (REQUIP) 1 MG tablet Take 1 mg by mouth at bedtime.    Not Taking at Unknown time   Scheduled: . albuterol  2.5 mg Inhalation TID  . ALPRAZolam  0.5 mg Oral QID  . atorvastatin  10 mg Oral Daily  . atorvastatin  20 mg Oral Daily  . carvedilol  12.5 mg Oral BID WC  . chlorhexidine  15 mL Mouth Rinse BID  . colchicine  0.6 mg Oral Q48H  . estradiol  0.5 mg Oral Daily  . fluticasone  2 spray Each Nare QHS  . heparin  5,000 Units Subcutaneous Q8H  . hydrALAZINE  25 mg Oral Q8H  . lisinopril  20 mg Oral Daily   And  . hydrochlorothiazide  12.5 mg Oral Daily  . mouth rinse  15 mL Mouth Rinse q12n4p  . PARoxetine  40 mg Oral Daily  . polyethylene glycol  17 g Oral Daily  . risperiDONE  0.25 mg Oral BID  . sodium chloride flush  3 mL Intravenous Q12H  . torsemide  40 mg Oral Daily   Continuous: . sodium chloride 75 mL/hr at 11/23/16 0256   AYT:[id number], labetalol, loperamide, metoCLOPramide  Assesment:She's been confused and hallucinating. I don't think it's clear what happened. She had psych evaluation in the emergency department and it was not felt that she needed psychiatric hospitalization but she has not improved. Neurology consultation has been requested and I have  discussed her situation with Dr. Merlene Laughter. She has renal failure approaching dialysis but her renal function is actually better somewhat don't think this is uremia. She has a previous diagnosis of bipolar disease and she may be psychotic from that. I started Respinol but it hasn't made much difference. She's not eating very well. She has diabetes which is pretty well controlled Principal Problem:   HTN (hypertension), malignant Active Problems:   Diabetes mellitus   Bipolar 1 disorder ([place])   Noncompliance with medication regimen   Panic attack   Confusion    Plan: Recheck blood. MRI today. Neurology consultation. Request repeat psychiatry consultation.    LOS: 2 days   Taiylor Virden L 11/24/2016, 8:47 AM

## 2016-11-24 NOTE — Procedures (Signed)
Martha A. Merlene Laughter, MD     [email or web address]           HISTORY: The patient is a 51 year old who presents with confusion. The studies were done to evaluate for seizures as an etiology of the confusion.  MEDICATIONS: Scheduled Meds: . albuterol  2.5 mg Inhalation TID  . ALPRAZolam  0.5 mg Oral QID  . atorvastatin  10 mg Oral Daily  . atorvastatin  20 mg Oral Daily  . carvedilol  12.5 mg Oral BID WC  . chlorhexidine  15 mL Mouth Rinse BID  . colchicine  0.6 mg Oral Q48H  . estradiol  0.5 mg Oral Daily  . fluticasone  2 spray Each Nare QHS  . heparin  5,000 Units Subcutaneous Q8H  . hydrALAZINE  25 mg Oral Q8H  . lisinopril  20 mg Oral Daily   And  . hydrochlorothiazide  12.5 mg Oral Daily  . mouth rinse  15 mL Mouth Rinse q12n4p  . PARoxetine  40 mg Oral Daily  . polyethylene glycol  17 g Oral Daily  . risperiDONE  0.25 mg Oral BID  . sodium chloride flush  3 mL Intravenous Q12H  . torsemide  40 mg Oral Daily   Continuous Infusions: . sodium chloride 75 mL/hr at 11/23/16 0256   PRN Meds:.albuterol, labetalol, loperamide, metoCLOPramide  Prior to Admission medications   Medication Sig Start Date End Date Taking? Authorizing Provider  albuterol (PROVENTIL HFA;VENTOLIN HFA) 108 (90 Base) MCG/ACT inhaler Inhale 2 puffs into the lungs every 4 (four) hours as needed for wheezing or shortness of breath. 07/23/16  Yes Francine Graven, DO  albuterol (PROVENTIL) (2.5 MG/3ML) 0.083% nebulizer solution Inhale 3 mLs (2.5 mg total) into the lungs 3 (three) times daily. 10/29/16  Yes Sinda Du, MD  ALPRAZolam Duanne Moron) 1 MG tablet Take 1 mg by mouth 4 (four) times daily.     Yes Historical Provider, MD  atorvastatin (LIPITOR) 20 MG tablet Take 20 mg by mouth daily. Take along with 10 mg tablet to equal 30 mg daily.   Yes Historical Provider, MD  baclofen (LIORESAL) 20 MG tablet Take 20 mg by mouth 3 (three) times daily as needed for muscle spasms.   Yes  Historical Provider, MD  carvedilol (COREG) 12.5 MG tablet Take 1 tablet (12.5 mg total) by mouth 2 (two) times daily with a meal. 05/26/16  Yes Cherene Altes, MD  colchicine 0.6 MG tablet Take 1 tablet (0.6 mg total) by mouth every other day. 11/19/16  Yes Sinda Du, MD  estradiol (ESTRACE) 0.5 MG tablet Take 0.5 mg by mouth daily.   Yes Historical Provider, MD  fluticasone (FLONASE) 50 MCG/ACT nasal spray Place 2 sprays into both nostrils at bedtime.   Yes Historical Provider, MD  hydrALAZINE (APRESOLINE) 25 MG tablet Take 1 tablet (25 mg total) by mouth every 8 (eight) hours. 10/29/16  Yes Sinda Du, MD  HYDROcodone-acetaminophen (NORCO) 10-325 MG tablet Take 1 tablet by mouth every 4 (four) hours as needed for moderate pain.   Yes Historical Provider, MD  lisinopril-hydrochlorothiazide (PRINZIDE,ZESTORETIC) 20-12.5 MG tablet Take 1 tablet by mouth daily.   Yes Historical Provider, MD  loperamide (IMODIUM) 2 MG capsule Take 1 capsule (2 mg total) by mouth 4 (four) times daily as needed for diarrhea or loose stools. 10/23/16  Yes Evalee Jefferson, PA-C  metoCLOPramide (REGLAN) 10 MG tablet Take 1 tablet (10 mg total) by mouth every 6 (six) hours as needed for nausea (or headache). 10/01/16  Yes Delora Fuel, MD  PARoxetine (PAXIL) 20 MG tablet Take 1 daily. This is to prevent panic attacks Patient taking differently: Take 40 mg by mouth daily. Take 1 daily. This is to prevent panic attacks 10/28/16  Yes Sinda Du, MD  polyethylene glycol [facility] / GLYCOLAX) packet Take 17 g by mouth daily. 10/30/16  Yes Sinda Du, MD  torsemide (DEMADEX) 20 MG tablet Take 2 tablets (40 mg total) by mouth daily. 11/19/16  Yes Sinda Du, MD  atorvastatin (LIPITOR) 10 MG tablet Take 10 mg by mouth daily. Take along with 20 mg tablet to equal 30 mg daily.    Historical Provider, MD  rOPINIRole (REQUIP) 1 MG tablet Take 1 mg by mouth at bedtime.     Historical Provider, MD      ANALYSIS: A 16 channel  recording was conducted for 21 minutes the background activity shows low voltage electrocortical rhythm of approximately 11 Hz. The recording is somewhat degraded by lead popping artifact. Beta activity is observed in the frontal areas. Awake and drowsy activities are observed. Photic stimulation and hyperventilation are not conducted. There is no focal or lateral slowing. There is no epileptiform activity is observed.   IMPRESSION: This recording of the awake and drowsy states is unremarkable.      Martha [name] A. Merlene [name], M.D.  Diplomate, Tax adviser of Psychiatry and Neurology ( Neurology).

## 2016-11-25 ENCOUNTER — Encounter: Payer: Self-pay | Admitting: Vascular Surgery

## 2016-11-25 LAB — FOLATE RBC
Folate, Hemolysate: 315 ng/mL
Folate, RBC: 890 ng/mL (ref >498)
HEMATOCRIT: 35.4 % (ref 34.0–46.6)

## 2016-11-25 MED ORDER — ALPRAZOLAM 0.25 MG PO TABS
0.2500 mg | ORAL_TABLET | Freq: Four times a day (QID) | ORAL | Status: DC
  Administered 2016-11-25 (×2): 0.25 mg via ORAL
  Filled 2016-11-25 (×3): qty 1

## 2016-11-25 MED ORDER — RISPERIDONE 0.5 MG PO TABS
0.5000 mg | ORAL_TABLET | Freq: Two times a day (BID) | ORAL | Status: DC
  Administered 2016-11-26 – 2016-12-01 (×11): 0.5 mg via ORAL
  Filled 2016-11-25 (×12): qty 1

## 2016-11-25 MED ORDER — PAROXETINE HCL 20 MG PO TABS
20.0000 mg | ORAL_TABLET | Freq: Every day | ORAL | Status: DC
Start: 2016-11-26 — End: 2016-12-01
  Administered 2016-11-26 – 2016-12-01 (×6): 20 mg via ORAL
  Filled 2016-11-25 (×6): qty 1

## 2016-11-25 NOTE — Clinical Social Work Note (Signed)
LCSW attempted to contact patient's daughter at the number listed on the chart. Number was disconnected.   LCSW left a voicemail message for patient's significant other listed on chart requesting return contact.    Khalaya Mcgurn, Clydene Pugh, LCSW

## 2016-11-25 NOTE — Evaluation (Addendum)
Physical Therapy Evaluation Patient Details Name: Martha [name] MRN: [id number] DOB: [birth year] Today's Date: 11/25/2016   History of Present Illness  51 y.o.femalewith medical history significant of CKD stage 5, COPD, HTN, DM, OSA, just discharged yesterday after several admission for AKI, given IVF with improved, returned to the ER with complaints of not feeling well, having visual hallucination intermittently.  Evaluation in the ER showed BP 210/115, and she was started on IV Cardene, with subsequent BP of 536 systolic.  Work up included creatinine at baseline, K ok, and no leukocytosis.  Hospitalist was asked to admit her for HTN urgency.  She has significant hx of psychiatric diagnosis as well, and TTS was consulted.  Dx: Unexplained encephalopathy but the picture seems most consistent with the toxic metabolic processes most likely from worsening renal failure.     Clinical Impression  Pt received in bed, fiance present, and dtr arrived at the end of evaluation.  Pt is very confused today, and history is obtained from a combination of fiance, chart review, and the patient.  After she was d/c home 11/19/2016, fiance states that they did not receive a w/c, and the walker they have is not "use-able."  Fiance had to assist pt with ambulating from the bed<>bathroom, and states he was supporting practically all her weight.  She has been refusing to eat, and likely required Total A for dressing/bathing.  During PT evaluation today, she is able to participate somewhat with conversation, but occasionally is tangental, and non-sensical.  She required Max A +2 with HOB raised for supine<>sit.  She requires constant Mod/Max A for static sitting balance due to strong posterior and right lean.  She holds her head rotated to the right and seems to have L sided inattention.  She requires multimodal cues to remain on task.  Pt is not safe to d/c home at this time, she requires extensive assistance due to weakness, and  confusion.  She is recommended for SNF.      Follow Up Recommendations SNF;Supervision/Assistance - 24 hour    Equipment Recommendations  None recommended by PT    Recommendations for Other Services       Precautions / Restrictions Precautions Precautions: Fall (B safety mitts donned) Precaution Comments: 2 falls - Oct 29 was walking into the house and she stumbled, The other time she was goign to the bathroom, and tripped on her flip flop bedroom shoes. 1 additional fall here in the hospital last admission.  Restrictions Weight Bearing Restrictions: No      Mobility  Bed Mobility Overal bed mobility: Needs Assistance Bed Mobility: Supine to Sit;Sit to Supine;Rolling Rolling: Max assist;+2 for physical assistance;+2 for safety/equipment   Supine to sit: Max assist;+2 for safety/equipment;HOB elevated Sit to supine: Total assist;+2 for safety/equipment;HOB elevated   General bed mobility comments: Pt requires increased time with use of bed pad to scoopt hips to the EOB.  Once she is sitting on the EOB, she requires constant assistance to maintain static upright posture.  Pt's LE's are restless, and she has difficulty maintaining contact with the floor.    Transfers Overall transfer level:  (NA due to poor static sitting balance, as well as poor cognition.  )                  Ambulation/Gait                Science writer  Modified Rankin (Stroke Patients Only)       Balance Overall balance assessment: History of Falls;Needs assistance Sitting-balance support: Bilateral upper extremity supported;Feet supported Sitting balance-Leahy Scale: Zero Sitting balance - Comments: pt demonstrates poor upright sitting posture - kyphotic with head forward and rotated left.  Pt also demonstrates strong pushing to the left, but able to obtain midline when L elbow is flexed and hand is placed on knee.  Pt requries Max A for lateral weight  shifting.   Postural control: Posterior lean;Right lateral lean                                   Pertinent Vitals/Pain Pain Assessment: No/denies pain    Home Living   Living Arrangements: Spouse/significant other;Children (fiance) Available Help at Discharge: Available 24 hours/day Type of Home: House Home Access: Stairs to enter   [employer] of Steps: 1 step Home Layout: Two level;Able to live on main level with bedroom/bathroom Home Equipment: Shower seat;Toilet riser Additional Comments: pt was supposed to receive a w/c after last admission, but it never arrived and she was re-admitted.      Prior Function Level of Independence: Independent with assistive device(s)   Gait / Transfers Assistance Needed: fiance states that she required assistance for mobiltiy upon d/c last time.  She never did obtain the RW and fiance states that the RW they have is not useable.  Fiance states he was basically supporting all her weight when she was walking to and from the bathroom.    ADL's / Homemaking Assistance Needed: finace states that she will not eat.  Has likely required assistance for all ADL's since last admission.         Hand Dominance   Dominant Hand: Right    Extremity/Trunk Assessment   Upper Extremity Assessment Upper Extremity Assessment: Defer to OT evaluation (Pt seems to have some UE tremors and weakness)    Lower Extremity Assessment Lower Extremity Assessment: Generalized weakness;Difficult to assess due to impaired cognition (Pt demonstrates B LE tremors/restlessness.  Grossly 3-/5 for strength in LE, but this is impaired by poor core strength. )    Cervical / Trunk Assessment Cervical / Trunk Assessment: Kyphotic;Other exceptions Cervical / Trunk Exceptions: Pt demonstrates head forward positioning with gaze and cervical rotation held to the right.  Pt is able to track and rotate to the left, but requries extensive multimodal cues to do  so.   Communication   Communication: Other (comment) (Sometimes clear, and other times it is garbled.  )  Cognition Arousal/Alertness: Lethargic Behavior During Therapy: Flat affect;Restless;Agitated Overall Cognitive Status: Impaired/Different from baseline Area of Impairment: Orientation                 Orientation Level: Disoriented to;Place;Time;Situation             General Comments: Pt states a street name when asked where she is.  She believes it is the 20th of April, but states the correct date.  When she is given the correct date (11/25/2016) she becomes agitated, stating PT is wrong.  Pt keeps expressing that she is at home, and she was at [facility] before coming home.  Her conversation and attention is very sporatic where she is sometimes able to participate in the conversation, and other times not, or what she says is tangental, and even non-sensical at times.       General Comments  Exercises     Assessment/Plan    PT Assessment Patient needs continued PT services  PT Problem List Decreased strength;Decreased activity tolerance;Decreased balance;Decreased mobility;Decreased knowledge of use of DME;Decreased safety awareness;Obesity;Decreased coordination;Decreased cognition       PT Treatment Interventions DME instruction;Gait training;Functional mobility training;Therapeutic activities;Therapeutic exercise;Balance training;Patient/family education;Cognitive remediation;Neuromuscular re-education;Wheelchair mobility training    PT Goals (Current goals can be found in the Care Plan section)  Acute Rehab PT Goals Patient Stated Goal: to go home PT Goal Formulation: With family Time For Goal Achievement: 12/09/16 Potential to Achieve Goals: Poor    Frequency Min 6X/week   Barriers to discharge        Co-evaluation PT/OT/SLP Co-Evaluation/Treatment: Yes Reason for Co-Treatment: Complexity of the patient's impairments (multi-system  involvement);Necessary to address cognition/behavior during functional activity;For patient/therapist safety;To address functional/ADL transfers PT goals addressed during session: Mobility/safety with mobility;Balance         AM-PAC PT "6 Clicks" Daily Activity  Outcome Measure Difficulty turning over in bed (including adjusting bedclothes, sheets and blankets)?: A Lot Difficulty moving from lying on back to sitting on the side of the bed? : A Lot Difficulty sitting down on and standing up from a chair with arms (e.g., wheelchair, bedside commode, etc,.)?: Total Help needed moving to and from a bed to chair (including a wheelchair)?: Total Help needed walking in hospital room?: Total Help needed climbing 3-5 steps with a railing? : Total 6 Click Score: 8    End of Session   Activity Tolerance: Patient limited by fatigue;Patient limited by lethargy Patient left: in bed;with bed alarm set;with family/visitor present Nurse Communication: Mobility status (mobiltiy sheet left hanging in the room.  ) PT Visit Diagnosis: Muscle weakness (generalized) (M62.81);Other abnormalities of gait and mobility (R26.89);History of falling (Z91.81);Other symptoms and signs involving the nervous system (R29.898)    Time: 7092-9574 PT Time Calculation (min) (ACUTE ONLY): 36 min   Charges:   PT Evaluation $PT Eval Moderate Complexity: 1 Procedure     PT G Codes:   PT G-Codes **NOT FOR INPATIENT CLASS** Functional Assessment Tool Used: AM-PAC 6 Clicks Basic Mobility;Clinical judgement Mobility: Walking and Moving Around Current Status (B3403): At least 80 percent but less than 100 percent impaired, limited or restricted Mobility: Walking and Moving Around Goal Status [phone]): At least 60 percent but less than 80 percent impaired, limited or restricted    Beth Nealie Mchatton, PT, DPT X: [phone]

## 2016-11-25 NOTE — Consult Note (Signed)
Reason for Consult: Chronic renal failure Referring Physician: Dr. [name] is an 51 y.o. female.  HPI: She is a patient who has history of chronic renal failure stage V presently was brought by her family's because of confusion, restlessness, hallucination after she was discharged from the hospital. Patient was hypertensive when she was seen in emergency room and admitted to the hospital. Since patient has previous history of bipolar disorder she was evaluated by psychiatry in emergency room. Presently consult is called because of her chronic renal failure.  Past Medical History:  Diagnosis Date  . Acid reflux   . Anemia   . Arthritis   . Bipolar 1 disorder ([place])   . Cervical radiculopathy   . COPD (chronic obstructive pulmonary disease) ([place]) 2014   bronchitis  . Degenerative disc disease, thoracic   . Depression   . Diabetes mellitus   . Hypertension   . Noncompliance with medication regimen   . Obesity (BMI 30-39.9)   . OSA (obstructive sleep apnea)    cpap  . Panic attack   . RLS (restless legs syndrome)     Past Surgical History:  Procedure Laterality Date  . BIOPSY N/A 05/17/2013   Procedure: BIOPSY;  Surgeon: [name], MD;  Location: [facility];  Service: Endoscopy;  Laterality: N/A;  . CHOLECYSTECTOMY N/A 04/27/2014   Procedure: LAPAROSCOPIC CHOLECYSTECTOMY;  Surgeon: [name], MD;  Location: [facility];  Service: General;  Laterality: N/A;  . COLONOSCOPY  06/06/2010   GDJ:MEQAST bleeding secondary to internal hemorrhoids but incomplete evaluation secondary to poor right colon prep/small rectal and sigmoid colon polyps (hyperplastic). PROPOFOL  . COLONOSCOPY  May 2013   Dr. Gilliam/[facility]: 5 mm a descending colon polyp, hyperplastic. Adequate bowel prep.  . ENDOMETRIAL ABLATION    . ESOPHAGOGASTRODUODENOSCOPY  06/06/2010   MHD:QQIWLNL erythema and edema of body of stomach, with sessile polypoid lesions. bx benign. no h.pylori  .  ESOPHAGOGASTRODUODENOSCOPY (EGD) WITH PROPOFOL N/A 05/17/2013   Dr. Oneida Alar: normal esophagus, moderate nodular gastritis, negative path, empiric Savary dilation  . FLEXIBLE SIGMOIDOSCOPY N/A 05/17/2013   Dr. Oneida Alar: moderate sized internal hemorrhoids  . HEMORRHOID BANDING N/A 05/17/2013   Procedure: HEMORRHOID BANDING;  Surgeon: [name], MD;  Location: [facility];  Service: Endoscopy;  Laterality: N/A;  2 bands placed  . INCISION AND DRAINAGE ABSCESS Left 10/11/2013   Procedure: INCISION AND DRAINAGE AND DEBRIDEMENT LEFT BREAST  ABSCESS;  Surgeon: [name], MD;  Location: [facility];  Service: General;  Laterality: Left;  . IRRIGATION AND DEBRIDEMENT ABSCESS Right 06/01/2013   Procedure: INCISION AND DRAINAGE AND DEBRIDEMENT ABSCESS RIGHT BREAST;  Surgeon: [name], MD;  Location: [facility];  Service: General;  Laterality: Right;  . SAVORY DILATION N/A 05/17/2013   Procedure: SAVORY DILATION;  Surgeon: [name], MD;  Location: [facility];  Service: Endoscopy;  Laterality: N/A;  14/15/16  . TUBAL LIGATION      Family History  Problem Relation Age of Onset  . Adopted: Yes  . Family history unknown: Yes    Social History:  reports that she quit smoking about 9 years ago. Her smoking use included Cigarettes. She has a 37.50 pack-year smoking history. She has quit using smokeless tobacco. She reports that she does not drink alcohol or use drugs.  Allergies:  Allergies  Allergen Reactions  . Codeine Nausea And Vomiting    Medications: I have reviewed the patient's current medications.  Results for orders placed or  performed during the hospital encounter of 11/20/16 (from the past 48 hour(s))  Comprehensive metabolic panel     Status: Abnormal   Collection Time: 11/24/16 10:05 AM  Result Value Ref Range   Sodium 145 135 - 145 mmol/L   Potassium 3.9 3.5 - 5.1 mmol/L   Chloride 112 (H) 101 - 111 mmol/L   CO2 22 22 - 32 mmol/L   Glucose, Bld 109 (H) 65 - 99 mg/dL   BUN  51 (H) 6 - 20 mg/dL   Creatinine, Ser 4.13 (H) 0.44 - 1.00 mg/dL   Calcium 9.2 8.9 - 10.3 mg/dL   Total Protein 6.5 6.5 - 8.1 g/dL   Albumin 3.6 3.5 - 5.0 g/dL   AST 17 15 - 41 U/L   ALT 37 14 - 54 U/L   Alkaline Phosphatase 70 38 - 126 U/L   Total Bilirubin 0.6 0.3 - 1.2 mg/dL   GFR calc non Af Amer 12 (L) >60 mL/min   GFR calc Af Amer 14 (L) >60 mL/min    Comment: (NOTE) The eGFR has been calculated using the CKD EPI equation. This calculation has not been validated in all clinical situations. eGFR's persistently <60 mL/min signify possible Chronic Kidney Disease.    Anion gap 11 5 - 15    Mr Brain Wo Contrast  Result Date: 11/24/2016 CLINICAL DATA:  Confusion and hallucinations. EXAM: MRI HEAD WITHOUT CONTRAST TECHNIQUE: Multiplanar, multiecho pulse sequences of the brain and surrounding structures were obtained without intravenous contrast. COMPARISON:  Head CT 11/22/2016 FINDINGS: Partial and motion degraded study. Sagittal T1, diffusion, axial T2, axial FLAIR sequences were obtained, with multiple motion degraded slices. Brain: No acute infarction, hemorrhage, hydrocephalus, extra-axial collection or mass lesion. No evidence of atrophy or white matter disease. Vascular: Grossly preserved major vessel flow voids. Skull and upper cervical spine: Negative Sinuses/Orbits: Negative IMPRESSION: Incomplete, motion degraded study as above.  No abnormal finding. Electronically Signed   By: Monte Fantasia M.D.   On: 11/24/2016 12:49    Review of Systems  Constitutional: Negative for chills and fever.  Respiratory: Negative for shortness of breath.   Cardiovascular: Negative for orthopnea.  Gastrointestinal: Negative for nausea and vomiting.  Psychiatric/Behavioral: Positive for hallucinations. Negative for depression and suicidal ideas. The patient is nervous/anxious.    Blood pressure (!) 172/94, pulse 98, temperature 98.5 F (36.9 C), temperature source Oral, resp. rate 18, height 5'  8" (1.727 m), weight 117.3 kg (258 lb 9.6 oz), SpO2 96 %. Physical Exam  Constitutional: No distress.  Eyes: No scleral icterus.  Neck: No JVD present.  Cardiovascular: Normal rate and regular rhythm.   Respiratory: She has no wheezes.  GI: She exhibits no distension. There is no rebound.  Musculoskeletal: She exhibits no edema.  Neurological:  Patient is somewhat somnolent but arousable. She is somewhat restless.    Assessment/Plan: Problem #1. Hallucination: At this moment patient seems to be somewhat somnolent and resting. This could be from her psychiatric problem. Patient is evaluated and followed by psychiatry. Problem #2 chronic renal failure: Stage V. Presently her renal function is stable. Her creatinine is in fact better than when she was admitted the last time. Patient is nonoliguric. Her potassium is normal. Problem #3 hypertension: Her blood pressure was high when she came. Most likely because she was not able to take her medications. Problem #4 history of COPD Problem #5 history of obstructive sleep apnea Problem #6 anemia all in her hemoglobin is within our target  goal. Problem #  7 metabolic bone disease: Her calcium and phosphorus is range. Plan: 1] We'll continue with low hydration. 2] we'll check renal panel, intact PTH and CBC in the morning  Martha [name] S 11/25/2016, 8:10 AM

## 2016-11-25 NOTE — Progress Notes (Signed)
Subjective: She's a little bit less confused. She is still hallucinating. Help from consultants as noted and appreciated. I think this is multifactorial encephalopathy and includes her baseline bipolar disease medical illnesses renal failure.  Objective: Vital signs in last 24 hours: Temp:  [98.1 F (36.7 C)-99 F (37.2 C)] 98.5 F (36.9 C) (05/01 0524) Pulse Rate:  [79-98] 98 (05/01 0524) Resp:  [18-20] 18 (05/01 0524) BP: (145-172)/(86-94) 172/94 (05/01 0524) SpO2:  [94 %-100 %] 96 % (05/01 0734) Weight change:  Last BM Date: 11/24/16  Intake/Output from previous day: 04/30 0701 - 05/01 0700 In: 240 [P.O.:240] Out: -   PHYSICAL EXAM General appearance: More alert. Still agitated Resp: rhonchi bilaterally Cardio: regular rate and rhythm, S1, S2 normal, no murmur, click, rub or gallop GI: soft, non-tender; bowel sounds normal; no masses,  no organomegaly Extremities: extremities normal, atraumatic, no cyanosis or edema Skin warm and dry  Lab Results:  Results for orders placed or performed during the hospital encounter of 11/20/16 (from the past 48 hour(s))  Comprehensive metabolic panel     Status: Abnormal   Collection Time: 11/24/16 10:05 AM  Result Value Ref Range   Sodium 145 135 - 145 mmol/[name]   Potassium 3.9 3.5 - 5.1 mmol/[name]   Chloride 112 (H) 101 - 111 mmol/[name]   CO2 22 22 - 32 mmol/[name]   Glucose, Bld 109 (H) 65 - 99 mg/dL   BUN 51 (H) 6 - 20 mg/dL   Creatinine, Ser 4.13 (H) 0.44 - 1.00 mg/dL   Calcium 9.2 8.9 - 10.3 mg/dL   Total Protein 6.5 6.5 - 8.1 g/dL   Albumin 3.6 3.5 - 5.0 g/dL   AST 17 15 - 41 U/[name]   ALT 37 14 - 54 U/[name]   Alkaline Phosphatase 70 38 - 126 U/[name]   Total Bilirubin 0.6 0.3 - 1.2 mg/dL   GFR calc non Af Amer 12 ([name]) >60 mL/min   GFR calc Af Amer 14 ([name]) >60 mL/min    Comment: (NOTE) The eGFR has been calculated using the CKD EPI equation. This calculation has not been validated in all clinical situations. eGFR's persistently <60 mL/min signify  possible Chronic Kidney Disease.    Anion gap 11 5 - 15    ABGS No results for input(s): PHART, PO2ART, TCO2, HCO3 in the last 72 hours.  Invalid input(s): PCO2 CULTURES Recent Results (from the past 240 hour(s))  Urine culture     Status: Abnormal   Collection Time: 11/22/16 10:55 PM  Result Value Ref Range Status   Specimen Description URINE, CLEAN CATCH  Final   Special Requests NONE  Final   Culture MULTIPLE SPECIES PRESENT, SUGGEST RECOLLECTION (A)  Final   Report Status 11/24/2016 FINAL  Final   Studies/Results: Mr Brain Wo Contrast  Result Date: 11/24/2016 CLINICAL DATA:  Confusion and hallucinations. EXAM: MRI HEAD WITHOUT CONTRAST TECHNIQUE: Multiplanar, multiecho pulse sequences of the brain and surrounding structures were obtained without intravenous contrast. COMPARISON:  Head CT 11/22/2016 FINDINGS: Partial and motion degraded study. Sagittal T1, diffusion, axial T2, axial FLAIR sequences were obtained, with multiple motion degraded slices. Brain: No acute infarction, hemorrhage, hydrocephalus, extra-axial collection or mass lesion. No evidence of atrophy or white matter disease. Vascular: Grossly preserved major vessel flow voids. Skull and upper cervical spine: Negative Sinuses/Orbits: Negative IMPRESSION: Incomplete, motion degraded study as above.  No abnormal finding. Electronically Signed   By: Monte Fantasia M.D.   On: 11/24/2016 12:49    Medications:  Prior  to Admission:  Prescriptions Prior to Admission  Medication Sig Dispense Refill Last Dose  . albuterol (PROVENTIL HFA;VENTOLIN HFA) 108 (90 Base) MCG/ACT inhaler Inhale 2 puffs into the lungs every 4 (four) hours as needed for wheezing or shortness of breath. 1 Inhaler 0 11/20/2016 at Unknown time  . albuterol (PROVENTIL) (2.5 MG/3ML) 0.083% nebulizer solution Inhale 3 mLs (2.5 mg total) into the lungs 3 (three) times daily. 75 mL 12 11/20/2016 at Unknown time  . ALPRAZolam (XANAX) 1 MG tablet Take 1 mg by mouth  4 (four) times daily.     11/20/2016 at Unknown time  . atorvastatin (LIPITOR) 20 MG tablet Take 20 mg by mouth daily. Take along with 10 mg tablet to equal 30 mg daily.   11/19/2016 at Unknown time  . baclofen (LIORESAL) 20 MG tablet Take 20 mg by mouth 3 (three) times daily as needed for muscle spasms.   11/19/2016 at Unknown time  . carvedilol (COREG) 12.5 MG tablet Take 1 tablet (12.5 mg total) by mouth 2 (two) times daily with a meal. 60 tablet 0 11/20/2016 at 1000  . colchicine 0.6 MG tablet Take 1 tablet (0.6 mg total) by mouth every other day. 16 tablet 12   . estradiol (ESTRACE) 0.5 MG tablet Take 0.5 mg by mouth daily.     . fluticasone (FLONASE) 50 MCG/ACT nasal spray Place 2 sprays into both nostrils at bedtime.   11/19/2016 at Unknown time  . hydrALAZINE (APRESOLINE) 25 MG tablet Take 1 tablet (25 mg total) by mouth every 8 (eight) hours. 90 tablet 12 11/20/2016 at Unknown time  . HYDROcodone-acetaminophen (NORCO) 10-325 MG tablet Take 1 tablet by mouth every 4 (four) hours as needed for moderate pain.   11/20/2016 at Unknown time  . lisinopril-hydrochlorothiazide (PRINZIDE,ZESTORETIC) 20-12.5 MG tablet Take 1 tablet by mouth daily.   11/20/2016 at Unknown time  . loperamide (IMODIUM) 2 MG capsule Take 1 capsule (2 mg total) by mouth 4 (four) times daily as needed for diarrhea or loose stools. 12 capsule 0 11/20/2016 at Unknown time  . metoCLOPramide (REGLAN) 10 MG tablet Take 1 tablet (10 mg total) by mouth every 6 (six) hours as needed for nausea (or headache). 30 tablet 0 11/20/2016 at Unknown time  . PARoxetine (PAXIL) 20 MG tablet Take 1 daily. This is to prevent panic attacks (Patient taking differently: Take 40 mg by mouth daily. Take 1 daily. This is to prevent panic attacks) 30 tablet 12 11/19/2016 at Unknown time  . polyethylene glycol (MIRALAX / GLYCOLAX) packet Take 17 g by mouth daily. 14 each 0 Past Week at Unknown time  . torsemide (DEMADEX) 20 MG tablet Take 2 tablets (40 mg total) by  mouth daily. 60 tablet 12 11/19/2016 at Unknown time  . atorvastatin (LIPITOR) 10 MG tablet Take 10 mg by mouth daily. Take along with 20 mg tablet to equal 30 mg daily.   Not Taking at Unknown time  . rOPINIRole (REQUIP) 1 MG tablet Take 1 mg by mouth at bedtime.    Not Taking at Unknown time   Scheduled: . albuterol  2.5 mg Inhalation TID  . ALPRAZolam  0.5 mg Oral QID  . atorvastatin  10 mg Oral Daily  . atorvastatin  20 mg Oral Daily  . carvedilol  12.5 mg Oral BID WC  . chlorhexidine  15 mL Mouth Rinse BID  . colchicine  0.6 mg Oral Q48H  . estradiol  0.5 mg Oral Daily  . fluticasone  2 spray Each  Nare QHS  . heparin  5,000 Units Subcutaneous Q8H  . hydrALAZINE  25 mg Oral Q8H  . lisinopril  20 mg Oral Daily   And  . hydrochlorothiazide  12.5 mg Oral Daily  . mouth rinse  15 mL Mouth Rinse q12n4p  . PARoxetine  40 mg Oral Daily  . polyethylene glycol  17 g Oral Daily  . risperiDONE  0.25 mg Oral BID  . sodium chloride flush  3 mL Intravenous Q12H  . torsemide  40 mg Oral Daily   Continuous: . sodium chloride 75 mL/hr at 11/24/16 2241   MOQ:[id number], labetalol, loperamide, metoCLOPramide  Assesment:She was admitted with hallucinations malignant hypertension. She has been very confused and has been hallucinating. I think this is multifactorial as noted. She does have stage V kidney disease and is approaching dialysis but her renal function is actually better than about 2 weeks ago when she was not hallucinating at that point. Psychiatry also saw her and I will initiate their recommendations. Principal Problem:   HTN (hypertension), malignant Active Problems:   Diabetes mellitus   Bipolar 1 disorder ([place])   Noncompliance with medication regimen   Panic attack   Confusion    Plan: No change in treatments at this point. PT and OT evaluation is underway.    LOS: 3 days   Martha [name] 11/25/2016, 9:20 AM

## 2016-11-25 NOTE — Evaluation (Signed)
Occupational Therapy Evaluation Patient Details Name: Martha [name] MRN: [id number] DOB: 03/05/1966 Today's Date: 11/25/2016    History of Present Illness 51 y.o.femalewith medical history significant of CKD stage 5, COPD, HTN, DM, OSA, just discharged yesterday after several admission for AKI, given IVF with improved, returned to the ER with complaints of not feeling well, having visual hallucination intermittently.  Evaluation in the ER showed BP 210/115, and she was started on IV Cardene, with subsequent BP of 601 systolic.  Work up included creatinine at baseline, K ok, and no leukocytosis.  Hospitalist was asked to admit her for HTN urgency.  She has significant hx of psychiatric diagnosis as well, and TTS was consulted.  Dx: Unexplained encephalopathy but the picture seems most consistent with the toxic metabolic processes most likely from worsening renal failure.    Clinical Impression   Pt is well known to OT as she was recently hospitalized at this facility for increased weakness. Patient was discharged and returned to the ED the next day as she was unable to ambulate, she stopped eating, she became very confused, and fiance was unable to care for her. Today, patient presents with increased confusion, difficulty following 1 step commands, increased need for assistance with basic ADL tasks and functional transfers as well as decreased UB strength. Due to patient's functional decline she is not safe to return home and will require SNF at discharge. OT skills required to focus on the above deficits in order to increase functional performance during daily tasks.     Follow Up Recommendations  SNF    Equipment Recommendations  None recommended by OT       Precautions / Restrictions Precautions Precautions: Fall (Bilateral safety mitts on) Precaution Comments: 2 falls - Oct 29 was walking into the house and she stumbled, The other time she was goign to the bathroom, and tripped on her flip  flop bedroom shoes. 1 additional fall here in the hospital last admission.  Restrictions Weight Bearing Restrictions: No      Mobility Bed Mobility Overal bed mobility: Needs Assistance Bed Mobility: Supine to Sit;Sit to Supine;Rolling Rolling: Max assist;+2 for physical assistance;+2 for safety/equipment   Supine to sit: Max assist;+2 for safety/equipment;HOB elevated Sit to supine: Total assist;+2 for safety/equipment;HOB elevated   General bed mobility comments: Pt requires increased time with use of bed pad to scoopt hips to the EOB.  Once she is sitting on the EOB, she requires constant assistance to maintain static upright posture.  Pt's LE's are restless, and she has difficulty maintaining contact with the floor.    Transfers Overall transfer level:  (No attempted due to poor static sitting balance and decreased congnition)                        ADL either performed or assessed with clinical judgement   ADL Overall ADL's : Needs assistance/impaired                     Lower Body Dressing: Total assistance;Bed level Lower Body Dressing Details (indicate cue type and reason): Donning hospital socks                                  Pertinent Vitals/Pain Pain Assessment: No/denies pain     Hand Dominance Right   Extremity/Trunk Assessment Upper Extremity Assessment Upper Extremity Assessment: Generalized weakness;Difficult to assess due to impaired  cognition (Patient with BUE weakness L weaker than R. 3/5 BUE shoulder strength. 4/5 bilateral triceps/biceps. Decreased gross grasp bilaterally.)    Cervical / Trunk Exceptions: Forward head position with gaze and cervical rotation to the right. Able to turn head to the left althugh requires cueing each time and will not hold.    Communication Communication Communication: Other (comment) (Mostly garbled speech with sporatic clear sentences)   Cognition Arousal/Alertness: Lethargic Behavior  During Therapy: Flat affect;Restless;Agitated Overall Cognitive Status: Impaired/Different from baseline Area of Impairment: Orientation                 Orientation Level: Disoriented to;Place;Time;Situation             General Comments: Pt states a street name when asked where she is.  She believes it is the 20th of April, but states the correct date.  When she is given the correct date (11/25/2016) she becomes agitated, stating PT is wrong.  Pt keeps expressing that she is at home, and she was at [facility] before coming home.  Her conversation and attention is very sporatic where she is sometimes able to participate in the conversation, and other times not, or what she says is tangental, and even non-sensical at times.               Home Living   Living Arrangements: Spouse/significant other;Children (fiance) Available Help at Discharge: Available 24 hours/day Type of Home: House Home Access: Stairs to enter Entrance Stairs-Number of Steps: 1 step   Home Layout: Two level;Able to live on main level with bedroom/bathroom Alternate Level Stairs-Number of Steps: 1 pt lives on the main level, and dtr lives on the top   Bathroom Shower/Tub: Teacher, early years/pre: Standard     Home Equipment: Civil engineer, contracting;Toilet riser   Additional Comments: pt was supposed to receive a w/c after last admission, but it never arrived and she was re-admitted.        Prior Functioning/Environment Level of Independence: Independent with assistive device(s)  Gait / Transfers Assistance Needed: fiance states that she required assistance for mobiltiy upon d/c last time.  She never did obtain the RW and fiance states that the RW they have is not useable.  Fiance states he was basically supporting all her weight when she was walking to and from the bathroom.   ADL's / Homemaking Assistance Needed: finace states that she will not eat.  Has likely required assistance for all ADL's since last  admission.             OT Problem List: Decreased strength;Decreased safety awareness;Decreased coordination;Impaired balance (sitting and/or standing);Decreased knowledge of use of DME or AE;Decreased cognition      OT Treatment/Interventions: Self-care/ADL training;Therapeutic exercise;Therapeutic activities;Cognitive remediation/compensation;Patient/family education;DME and/or AE instruction;Balance training    OT Goals(Current goals can be found in the care plan section) Acute Rehab OT Goals Patient Stated Goal: None stated OT Goal Formulation: Patient unable to participate in goal setting Time For Goal Achievement: 12/09/16 Potential to Achieve Goals: Fair  OT Frequency: Min 2X/week   Barriers to D/C:    Fiance/family is unable to provide the appropriate amount of physical assistance at this time.       Co-evaluation PT/OT/SLP Co-Evaluation/Treatment: Yes Reason for Co-Treatment: Complexity of the patient's impairments (multi-system involvement);Necessary to address cognition/behavior during functional activity;For patient/therapist safety;To address functional/ADL transfers PT goals addressed during session: Mobility/safety with mobility;Balance OT goals addressed during session: ADL's and self-care;Strengthening/ROM  AM-PAC PT "6 Clicks" Daily Activity     Outcome Measure Help from another person eating meals?: Total Help from another person taking care of personal grooming?: Total Help from another person toileting, which includes using toliet, bedpan, or urinal?: Total Help from another person bathing (including washing, rinsing, drying)?: Total Help from another person to put on and taking off regular upper body clothing?: Total Help from another person to put on and taking off regular lower body clothing?: Total 6 Click Score: 6   End of Session    Activity Tolerance: Patient limited by fatigue;Patient tolerated treatment well Patient left: in bed;with call  bell/phone within reach;with family/visitor present;with restraints reapplied;with bed alarm set  OT Visit Diagnosis: Muscle weakness (generalized) (M62.81)                Time: 4982-6415 OT Time Calculation (min): 36 min Charges:  OT General Charges $OT Visit: 1 Procedure OT Evaluation $OT Eval Low Complexity: 1 Procedure G-Codes: OT G-codes **NOT FOR INPATIENT CLASS** Functional Assessment Tool Used: AM-PAC 6 Clicks Daily Activity Functional Limitation: Self care Self Care Current Status (A3094): 100 percent impaired, limited or restricted Self Care Goal Status (M7680): At least 80 percent but less than 100 percent impaired, limited or restricted   Ailene Ravel, OTR/L,CBIS  [phone]   Azyriah Nevins, Clarene Duke 11/25/2016, 10:19 AM

## 2016-11-25 NOTE — Consult Note (Addendum)
Martha A. Merlene Laughter, MD     [email or web address]          Martha [name] is an 51 y.o. female.   ASSESSMENT/PLAN: Unexplained encephalopathy but the picture seems most consistent with the toxic metabolic processes most likely from worsening renal failure. The workup has revealed no other cause. Minimize the use of psychotropic medications.   Tremors which are likely due to toxic metabolic processes.   Overall the patient remains unchanged. She seemed somewhat more alert but still quite confused. EEG is unrevealing.    GENERAL: Obese female who currently is in no acute distress.  HEENT: Supple. Atraumatic normocephalic.    EXTREMITIES: No edema; there is possible asterisis noted of the upper extremities   BACK: Normal.  SKIN: Normal by inspection.    MENTAL STATUS: Her eyes are open to today which is a significant difference. She does have nonsensical conversations asking why my dictated tests I'm doing that she's not going to allow with. She thinks she is at home. She does follow commands.  CRANIAL NERVES: Pupils are equal, round and reactive to light and accommodation; extra ocular movements are full, there appears to be significant jerky pursuits with eye movements; visual fields are unreliable; upper and lower facial muscles are normal in strength and symmetric, there is no flattening of the nasolabial folds; tongue is midline; uvula is midline; shoulder elevation is normal.  MOTOR: Normal tone, bulk and strength in arms and 3/5 legs; no pronator drift.   COORDINATION: Left finger to nose is normal, right finger to nose is normal; she does have a narrow continuous frequency moderate amplitude rest tremor and postural tremors. No rigidity or bradykinesia appreciated.  SENSATION: Normal to light touch and pain.     The brain MRI is reviewed in person and shows no acute findings on DWI. This scan was stopped because of motion. However, FLAIR imaging  shows no white matter lesions and no evidence of encephalomalacia. This scan is essentially normal.      Blood pressure (!) 172/94, pulse 98, temperature 98.5 F (36.9 C), temperature source Oral, resp. rate 18, height '5\' 8"'$  (1.727 m), weight 258 lb 9.6 oz (117.3 kg), SpO2 96 %.  Past Medical History:  Diagnosis Date  . Acid reflux   . Anemia   . Arthritis   . Bipolar 1 disorder ([place])   . Cervical radiculopathy   . COPD (chronic obstructive pulmonary disease) ([place]) 2014   bronchitis  . Degenerative disc disease, thoracic   . Depression   . Diabetes mellitus   . Hypertension   . Noncompliance with medication regimen   . Obesity (BMI 30-39.9)   . OSA (obstructive sleep apnea)    cpap  . Panic attack   . RLS (restless legs syndrome)     Past Surgical History:  Procedure Laterality Date  . BIOPSY N/A 05/17/2013   Procedure: BIOPSY;  Surgeon: [name], MD;  Location: [facility];  Service: Endoscopy;  Laterality: N/A;  . CHOLECYSTECTOMY N/A 04/27/2014   Procedure: LAPAROSCOPIC CHOLECYSTECTOMY;  Surgeon: [name], MD;  Location: [facility];  Service: General;  Laterality: N/A;  . COLONOSCOPY  06/06/2010   KTG:YBWLSL bleeding secondary to internal hemorrhoids but incomplete evaluation secondary to poor right colon prep/small rectal and sigmoid colon polyps (hyperplastic). PROPOFOL  . COLONOSCOPY  May 2013   Dr. Gilliam/[facility]: 5 mm a descending colon polyp, hyperplastic. Adequate bowel prep.  . ENDOMETRIAL ABLATION    . ESOPHAGOGASTRODUODENOSCOPY  06/06/2010  XBD:ZHGDJME erythema and edema of body of stomach, with sessile polypoid lesions. bx benign. no h.pylori  . ESOPHAGOGASTRODUODENOSCOPY (EGD) WITH PROPOFOL N/A 05/17/2013   Dr. Oneida Alar: normal esophagus, moderate nodular gastritis, negative path, empiric Savary dilation  . FLEXIBLE SIGMOIDOSCOPY N/A 05/17/2013   Dr. Oneida Alar: moderate sized internal hemorrhoids  . HEMORRHOID BANDING N/A 05/17/2013   Procedure:  HEMORRHOID BANDING;  Surgeon: [name], MD;  Location: [facility];  Service: Endoscopy;  Laterality: N/A;  2 bands placed  . INCISION AND DRAINAGE ABSCESS Left 10/11/2013   Procedure: INCISION AND DRAINAGE AND DEBRIDEMENT LEFT BREAST  ABSCESS;  Surgeon: [name], MD;  Location: [facility];  Service: General;  Laterality: Left;  . IRRIGATION AND DEBRIDEMENT ABSCESS Right 06/01/2013   Procedure: INCISION AND DRAINAGE AND DEBRIDEMENT ABSCESS RIGHT BREAST;  Surgeon: [name], MD;  Location: [facility];  Service: General;  Laterality: Right;  . SAVORY DILATION N/A 05/17/2013   Procedure: SAVORY DILATION;  Surgeon: [name], MD;  Location: [facility];  Service: Endoscopy;  Laterality: N/A;  14/15/16  . TUBAL LIGATION      Family History  Problem Relation Age of Onset  . Adopted: Yes  . Family history unknown: Yes    Social History:  reports that she quit smoking about 9 years ago. Her smoking use included Cigarettes. She has a 37.50 pack-year smoking history. She has quit using smokeless tobacco. She reports that she does not drink alcohol or use drugs.  Allergies:  Allergies  Allergen Reactions  . Codeine Nausea And Vomiting    Medications: Prior to Admission medications   Medication Sig Start Date End Date Taking? Authorizing Provider  albuterol (PROVENTIL HFA;VENTOLIN HFA) 108 (90 Base) MCG/ACT inhaler Inhale 2 puffs into the lungs every 4 (four) hours as needed for wheezing or shortness of breath. 07/23/16  Yes Francine Graven, DO  albuterol (PROVENTIL) (2.5 MG/3ML) 0.083% nebulizer solution Inhale 3 mLs (2.5 mg total) into the lungs 3 (three) times daily. 10/29/16  Yes Sinda Du, MD  ALPRAZolam Duanne Moron) 1 MG tablet Take 1 mg by mouth 4 (four) times daily.     Yes Historical Provider, MD  atorvastatin (LIPITOR) 20 MG tablet Take 20 mg by mouth daily. Take along with 10 mg tablet to equal 30 mg daily.   Yes Historical Provider, MD  baclofen (LIORESAL) 20 MG tablet Take 20  mg by mouth 3 (three) times daily as needed for muscle spasms.   Yes Historical Provider, MD  carvedilol (COREG) 12.5 MG tablet Take 1 tablet (12.5 mg total) by mouth 2 (two) times daily with a meal. 05/26/16  Yes Cherene Altes, MD  colchicine 0.6 MG tablet Take 1 tablet (0.6 mg total) by mouth every other day. 11/19/16  Yes Sinda Du, MD  estradiol (ESTRACE) 0.5 MG tablet Take 0.5 mg by mouth daily.   Yes Historical Provider, MD  fluticasone (FLONASE) 50 MCG/ACT nasal spray Place 2 sprays into both nostrils at bedtime.   Yes Historical Provider, MD  hydrALAZINE (APRESOLINE) 25 MG tablet Take 1 tablet (25 mg total) by mouth every 8 (eight) hours. 10/29/16  Yes Sinda Du, MD  HYDROcodone-acetaminophen (NORCO) 10-325 MG tablet Take 1 tablet by mouth every 4 (four) hours as needed for moderate pain.   Yes Historical Provider, MD  lisinopril-hydrochlorothiazide (PRINZIDE,ZESTORETIC) 20-12.5 MG tablet Take 1 tablet by mouth daily.   Yes Historical Provider, MD  loperamide (IMODIUM) 2 MG capsule Take 1 capsule (2 mg total) by mouth 4 (four)  times daily as needed for diarrhea or loose stools. 10/23/16  Yes Burgess Amor, PA-C  metoCLOPramide (REGLAN) 10 MG tablet Take 1 tablet (10 mg total) by mouth every 6 (six) hours as needed for nausea (or headache). 10/01/16  Yes Dione Booze, MD  PARoxetine (PAXIL) 20 MG tablet Take 1 daily. This is to prevent panic attacks Patient taking differently: Take 40 mg by mouth daily. Take 1 daily. This is to prevent panic attacks 10/28/16  Yes Kari Baars, MD  polyethylene glycol [facility] / GLYCOLAX) packet Take 17 g by mouth daily. 10/30/16  Yes Kari Baars, MD  torsemide (DEMADEX) 20 MG tablet Take 2 tablets (40 mg total) by mouth daily. 11/19/16  Yes Kari Baars, MD  atorvastatin (LIPITOR) 10 MG tablet Take 10 mg by mouth daily. Take along with 20 mg tablet to equal 30 mg daily.    Historical Provider, MD  rOPINIRole (REQUIP) 1 MG tablet Take 1 mg by mouth at  bedtime.     Historical Provider, MD    Scheduled Meds: . albuterol  2.5 mg Inhalation TID  . ALPRAZolam  0.5 mg Oral QID  . atorvastatin  10 mg Oral Daily  . atorvastatin  20 mg Oral Daily  . carvedilol  12.5 mg Oral BID WC  . chlorhexidine  15 mL Mouth Rinse BID  . colchicine  0.6 mg Oral Q48H  . estradiol  0.5 mg Oral Daily  . fluticasone  2 spray Each Nare QHS  . heparin  5,000 Units Subcutaneous Q8H  . hydrALAZINE  25 mg Oral Q8H  . lisinopril  20 mg Oral Daily   And  . hydrochlorothiazide  12.5 mg Oral Daily  . mouth rinse  15 mL Mouth Rinse q12n4p  . PARoxetine  40 mg Oral Daily  . polyethylene glycol  17 g Oral Daily  . risperiDONE  0.25 mg Oral BID  . sodium chloride flush  3 mL Intravenous Q12H  . torsemide  40 mg Oral Daily   Continuous Infusions: . sodium chloride 75 mL/hr at 11/24/16 2241   PRN Meds:.albuterol, labetalol, loperamide, metoCLOPramide     Results for orders placed or performed during the hospital encounter of 11/20/16 (from the past 48 hour(s))  Comprehensive metabolic panel     Status: Abnormal   Collection Time: 11/24/16 10:05 AM  Result Value Ref Range   Sodium 145 135 - 145 mmol/L   Potassium 3.9 3.5 - 5.1 mmol/L   Chloride 112 (H) 101 - 111 mmol/L   CO2 22 22 - 32 mmol/L   Glucose, Bld 109 (H) 65 - 99 mg/dL   BUN 51 (H) 6 - 20 mg/dL   Creatinine, Ser 5.10 (H) 0.44 - 1.00 mg/dL   Calcium 9.2 8.9 - 23.9 mg/dL   Total Protein 6.5 6.5 - 8.1 g/dL   Albumin 3.6 3.5 - 5.0 g/dL   AST 17 15 - 41 U/L   ALT 37 14 - 54 U/L   Alkaline Phosphatase 70 38 - 126 U/L   Total Bilirubin 0.6 0.3 - 1.2 mg/dL   GFR calc non Af Amer 12 (L) >60 mL/min   GFR calc Af Amer 14 (L) >60 mL/min    Comment: (NOTE) The eGFR has been calculated using the CKD EPI equation. This calculation has not been validated in all clinical situations. eGFR's persistently <60 mL/min signify possible Chronic Kidney Disease.    Anion gap 11 5 - 15     Studies/Results:     Kyndra Condron A. Qusai Kem,  M.D.  Diplomate, [place] Board of [employer]iatry and Neurology ( Neurology). 11/25/2016, 8:21 AM

## 2016-11-26 MED ORDER — LORAZEPAM 2 MG/ML IJ SOLN
0.5000 mg | Freq: Every day | INTRAMUSCULAR | Status: DC
  Administered 2016-11-26 – 2016-11-30 (×5): 0.5 mg via INTRAVENOUS
  Filled 2016-11-26 (×5): qty 1

## 2016-11-26 MED ORDER — ALPRAZOLAM 0.25 MG PO TABS
0.2500 mg | ORAL_TABLET | Freq: Three times a day (TID) | ORAL | Status: DC
Start: 2016-11-26 — End: 2016-12-01
  Administered 2016-11-26 – 2016-12-01 (×16): 0.25 mg via ORAL
  Filled 2016-11-26 (×16): qty 1

## 2016-11-26 NOTE — Progress Notes (Signed)
Subjective: She continues to be confused and I think hallucinating. No new problems have been noted. She's not eating very well. She apparently will need for her daughter but not for anyone else.  Objective: Vital signs in last 24 hours: Temp:  [97.7 F (36.5 C)-99.2 F (37.3 C)] 97.7 F (36.5 C) (05/02 0432) Pulse Rate:  [81-99] 99 (05/02 0432) Resp:  [18-20] 20 (05/02 0432) BP: (120-191)/(58-92) 120/64 (05/02 0520) SpO2:  [92 %-98 %] 92 % (05/02 0812) Weight change:  Last BM Date: 11/25/16  Intake/Output from previous day: 05/01 0701 - 05/02 0700 In: 6420 [P.O.:120; I.V.:6300] Out: -   PHYSICAL EXAM General appearance: Responsive, recognizes me but still apparently hallucinating Resp: clear to auscultation bilaterally Cardio: regular rate and rhythm, S1, S2 normal, no murmur, click, rub or gallop GI: soft, non-tender; bowel sounds normal; no masses,  no organomegaly Extremities: extremities normal, atraumatic, no cyanosis or edema Skin warm and dry  Lab Results:  Results for orders placed or performed during the hospital encounter of 11/20/16 (from the past 48 hour(s))  Folate RBC     Status: None   Collection Time: 11/24/16 10:05 AM  Result Value Ref Range   Folate, Hemolysate 315.0 Not Estab. ng/mL   Hematocrit 35.4 34.0 - 46.6 %   Folate, RBC 890 >498 ng/mL    Comment: (NOTE) Performed At: [facility] [address] [place], [place] [id number] Mila Homer MD SU:[id number]   Comprehensive metabolic panel     Status: Abnormal   Collection Time: 11/24/16 10:05 AM  Result Value Ref Range   Sodium 145 135 - 145 mmol/L   Potassium 3.9 3.5 - 5.1 mmol/L   Chloride 112 (H) 101 - 111 mmol/L   CO2 22 22 - 32 mmol/L   Glucose, Bld 109 (H) 65 - 99 mg/dL   BUN 51 (H) 6 - 20 mg/dL   Creatinine, Ser 7.71 (H) 0.44 - 1.00 mg/dL   Calcium 9.2 8.9 - 90.2 mg/dL   Total Protein 6.5 6.5 - 8.1 g/dL   Albumin 3.6 3.5 - 5.0 g/dL   AST 17 15 - 41 U/L   ALT 37 14 - 54  U/L   Alkaline Phosphatase 70 38 - 126 U/L   Total Bilirubin 0.6 0.3 - 1.2 mg/dL   GFR calc non Af Amer 12 (L) >60 mL/min   GFR calc Af Amer 14 (L) >60 mL/min    Comment: (NOTE) The eGFR has been calculated using the CKD EPI equation. This calculation has not been validated in all clinical situations. eGFR's persistently <60 mL/min signify possible Chronic Kidney Disease.    Anion gap 11 5 - 15    ABGS No results for input(s): PHART, PO2ART, TCO2, HCO3 in the last 72 hours.  Invalid input(s): PCO2 CULTURES Recent Results (from the past 240 hour(s))  Urine culture     Status: Abnormal   Collection Time: 11/22/16 10:55 PM  Result Value Ref Range Status   Specimen Description URINE, CLEAN CATCH  Final   Special Requests NONE  Final   Culture MULTIPLE SPECIES PRESENT, SUGGEST RECOLLECTION (A)  Final   Report Status 11/24/2016 FINAL  Final   Studies/Results: Mr Brain Wo Contrast  Result Date: 11/24/2016 CLINICAL DATA:  Confusion and hallucinations. EXAM: MRI HEAD WITHOUT CONTRAST TECHNIQUE: Multiplanar, multiecho pulse sequences of the brain and surrounding structures were obtained without intravenous contrast. COMPARISON:  Head CT 11/22/2016 FINDINGS: Partial and motion degraded study. Sagittal T1, diffusion, axial T2, axial FLAIR sequences were  obtained, with multiple motion degraded slices. Brain: No acute infarction, hemorrhage, hydrocephalus, extra-axial collection or mass lesion. No evidence of atrophy or white matter disease. Vascular: Grossly preserved major vessel flow voids. Skull and upper cervical spine: Negative Sinuses/Orbits: Negative IMPRESSION: Incomplete, motion degraded study as above.  No abnormal finding. Electronically Signed   By: Monte Fantasia M.D.   On: 11/24/2016 12:49    Medications:  Prior to Admission:  Prescriptions Prior to Admission  Medication Sig Dispense Refill Last Dose  . albuterol (PROVENTIL HFA;VENTOLIN HFA) 108 (90 Base) MCG/ACT inhaler  Inhale 2 puffs into the lungs every 4 (four) hours as needed for wheezing or shortness of breath. 1 Inhaler 0 11/20/2016 at Unknown time  . albuterol (PROVENTIL) (2.5 MG/3ML) 0.083% nebulizer solution Inhale 3 mLs (2.5 mg total) into the lungs 3 (three) times daily. 75 mL 12 11/20/2016 at Unknown time  . ALPRAZolam (XANAX) 1 MG tablet Take 1 mg by mouth 4 (four) times daily.     11/20/2016 at Unknown time  . atorvastatin (LIPITOR) 20 MG tablet Take 20 mg by mouth daily. Take along with 10 mg tablet to equal 30 mg daily.   11/19/2016 at Unknown time  . baclofen (LIORESAL) 20 MG tablet Take 20 mg by mouth 3 (three) times daily as needed for muscle spasms.   11/19/2016 at Unknown time  . carvedilol (COREG) 12.5 MG tablet Take 1 tablet (12.5 mg total) by mouth 2 (two) times daily with a meal. 60 tablet 0 11/20/2016 at 1000  . colchicine 0.6 MG tablet Take 1 tablet (0.6 mg total) by mouth every other day. 16 tablet 12   . estradiol (ESTRACE) 0.5 MG tablet Take 0.5 mg by mouth daily.     . fluticasone (FLONASE) 50 MCG/ACT nasal spray Place 2 sprays into both nostrils at bedtime.   11/19/2016 at Unknown time  . hydrALAZINE (APRESOLINE) 25 MG tablet Take 1 tablet (25 mg total) by mouth every 8 (eight) hours. 90 tablet 12 11/20/2016 at Unknown time  . HYDROcodone-acetaminophen (NORCO) 10-325 MG tablet Take 1 tablet by mouth every 4 (four) hours as needed for moderate pain.   11/20/2016 at Unknown time  . lisinopril-hydrochlorothiazide (PRINZIDE,ZESTORETIC) 20-12.5 MG tablet Take 1 tablet by mouth daily.   11/20/2016 at Unknown time  . loperamide (IMODIUM) 2 MG capsule Take 1 capsule (2 mg total) by mouth 4 (four) times daily as needed for diarrhea or loose stools. 12 capsule 0 11/20/2016 at Unknown time  . metoCLOPramide (REGLAN) 10 MG tablet Take 1 tablet (10 mg total) by mouth every 6 (six) hours as needed for nausea (or headache). 30 tablet 0 11/20/2016 at Unknown time  . PARoxetine (PAXIL) 20 MG tablet Take 1 daily.  This is to prevent panic attacks (Patient taking differently: Take 40 mg by mouth daily. Take 1 daily. This is to prevent panic attacks) 30 tablet 12 11/19/2016 at Unknown time  . polyethylene glycol (MIRALAX / GLYCOLAX) packet Take 17 g by mouth daily. 14 each 0 Past Week at Unknown time  . torsemide (DEMADEX) 20 MG tablet Take 2 tablets (40 mg total) by mouth daily. 60 tablet 12 11/19/2016 at Unknown time  . atorvastatin (LIPITOR) 10 MG tablet Take 10 mg by mouth daily. Take along with 20 mg tablet to equal 30 mg daily.   Not Taking at Unknown time  . rOPINIRole (REQUIP) 1 MG tablet Take 1 mg by mouth at bedtime.    Not Taking at Unknown time   Scheduled: .  albuterol  2.5 mg Inhalation TID  . ALPRAZolam  0.25 mg Oral TID  . atorvastatin  10 mg Oral Daily  . atorvastatin  20 mg Oral Daily  . carvedilol  12.5 mg Oral BID WC  . chlorhexidine  15 mL Mouth Rinse BID  . colchicine  0.6 mg Oral Q48H  . estradiol  0.5 mg Oral Daily  . fluticasone  2 spray Each Nare QHS  . heparin  5,000 Units Subcutaneous Q8H  . hydrALAZINE  25 mg Oral Q8H  . lisinopril  20 mg Oral Daily   And  . hydrochlorothiazide  12.5 mg Oral Daily  . LORazepam  0.5 mg Intravenous QHS  . mouth rinse  15 mL Mouth Rinse q12n4p  . PARoxetine  20 mg Oral Daily  . polyethylene glycol  17 g Oral Daily  . risperiDONE  0.5 mg Oral BID  . sodium chloride flush  3 mL Intravenous Q12H  . torsemide  40 mg Oral Daily   Continuous: . sodium chloride 75 mL/hr at 11/26/16 0051   OLM:[id number], labetalol, loperamide, metoCLOPramide  Assesment:She was admitted with malignant hypertension and hallucinations. She is mildly better. She's not eating well. She's not wearing her CPAP. Help from neurology and nephrology and psychiatry noted and appreciated. I am increasing risperidone per psychiatry recommendations yesterday reducing Xanax. I'm going to give her a dose of IV Ativan at night to see if we can get her to wear CPAP. Continue other  treatments. Recheck blood tomorrow Principal Problem:   HTN (hypertension), malignant Active Problems:   Diabetes mellitus   Bipolar 1 disorder ([place])   Noncompliance with medication regimen   Panic attack   Confusion    Plan: As above    LOS: 4 days   Akisha Sturgill L 11/26/2016, 8:47 AM

## 2016-11-26 NOTE — Progress Notes (Signed)
Physical Therapy Treatment Patient Details Name: Martha [name] MRN: [id number] DOB: 1966/07/23 Today's Date: 11/26/2016    History of Present Illness 51 y.o.femalewith medical history significant of CKD stage 5, COPD, HTN, DM, OSA, just discharged yesterday after several admission for AKI, given IVF with improved, returned to the ER with complaints of not feeling well, having visual hallucination intermittently.  Evaluation in the ER showed BP 210/115, and she was started on IV Cardene, with subsequent BP of 099 systolic.  Work up included creatinine at baseline, K ok, and no leukocytosis.  Hospitalist was asked to admit her for HTN urgency.  She has significant hx of psychiatric diagnosis as well, and TTS was consulted.  Dx: Unexplained encephalopathy but the picture seems most consistent with the toxic metabolic processes most likely from worsening renal failure.     PT Comments    Pt received in bed, she remains confused and is babbling non-sensically, with gaze focused to the right with body rotated right.  Bed level treatment provided today due pt's poor ability to follow commands, weakness, restlessness, and need for +2 person assist for any OOB mobility.  PT worked on cervical rotation to the left due to pt demonstrating L sided neglect and inability to track past midline to the left.  Pt was also able to perform a few LE exercises, but difficult to maintain attention.  She is not safe to return home, therefore, continue to recommend SNF.     Follow Up Recommendations  SNF;Supervision/Assistance - 24 hour     Equipment Recommendations  None recommended by PT    Recommendations for Other Services       Precautions / Restrictions Precautions Precautions: Fall (B safety mitts) Precaution Comments: 2 falls - Oct 29 was walking into the house and she stumbled, The other time she was goign to the bathroom, and tripped on her flip flop bedroom shoes. 1 additional fall here in the hospital  last admission.  Restrictions Weight Bearing Restrictions: No    Mobility  Bed Mobility Overal bed mobility: Needs Assistance   Rolling: Max assist         General bed mobility comments: Pt found in bed with head rotated to the right, and gaze focused to the right.  Attempted to have pt participate in bed level activity, however, pt having great difficulty following commands today.     Pt demonstrates significant L sided neglect today, unable to recognize her L UE, and pushing to the right when being positioned in the bed.    Manual assistance provided for cervical rotation to the left, as well as cervical sidebending left.    Pt is able to visually track right and up to midline, but she is not able to track to the left past midline.   She was not able to follow commands for UE exercises, but able to follow some commands for LE exercises.    Not appropriate to attempt sitting EOB due to need for +2 person assistance due to pt's weakness, continued hallucinations, and restlessness.    Pt positioned in bed with pillow under R hip and R shoulder to promote alignment in supine position, and prevent constant rotation to the right.       Cognition Arousal/Alertness: Lethargic Behavior During Therapy: Restless;Agitated (emotions are very labile....seems to be crying after initiallly entering the room, and then switches quickly to laughing. ) Overall Cognitive Status: Impaired/Different from baseline  General Comments: Pt speaking non-sensical with minimal acutal words today.  Speech is very mumbled.       Exercises      General Comments        Pertinent Vitals/Pain Pain Assessment: No/denies pain    Home Living                      Prior Function            PT Goals (current goals can now be found in the care plan section) Acute Rehab PT Goals Patient Stated Goal: None stated PT Goal Formulation: With family Time  For Goal Achievement: 12/09/16 Potential to Achieve Goals: Poor Progress towards PT goals: Not progressing toward goals - comment    Frequency           PT Plan Current plan remains appropriate    Co-evaluation              AM-PAC PT "6 Clicks" Daily Activity  Outcome Measure  Difficulty turning over in bed (including adjusting bedclothes, sheets and blankets)?: A Lot Difficulty moving from lying on back to sitting on the side of the bed? : Total Difficulty sitting down on and standing up from a chair with arms (e.g., wheelchair, bedside commode, etc,.)?: Total Help needed moving to and from a bed to chair (including a wheelchair)?: Total Help needed walking in hospital room?: Total Help needed climbing 3-5 steps with a railing? : Total 6 Click Score: 7    End of Session   Activity Tolerance: Other (comment) (mental status) Patient left: in bed;with bed alarm set   PT Visit Diagnosis: Muscle weakness (generalized) (M62.81);Other abnormalities of gait and mobility (R26.89);History of falling (Z91.81);Other symptoms and signs involving the nervous system (R29.898)     Time: 3748-2707 PT Time Calculation (min) (ACUTE ONLY): 17 min  Charges:  $Therapeutic Activity: 8-22 mins                    G Codes:  Functional Assessment Tool Used: AM-PAC 6 Clicks Basic Mobility    Beth Findley Blankenbaker, PT, DPT X: [phone]

## 2016-11-26 NOTE — Progress Notes (Signed)
Martha [name]  MRN: [id number]  DOB/AGE: [birth year] 51 y.o.  Primary Care Physician:HAWKINS,EDWARD L, MD  Admit date: 11/20/2016  Chief Complaint:  Chief Complaint  Patient presents with  . Weakness    S-Pt presented on  11/20/2016 with  Chief Complaint  Patient presents with  . Weakness  .    Pt remains confused. Pt does not offer any specific complaints.     Pt was able to recognize me but not offer any specific complaints.  Meds  . albuterol  2.5 mg Inhalation TID  . ALPRAZolam  0.25 mg Oral TID  . atorvastatin  10 mg Oral Daily  . atorvastatin  20 mg Oral Daily  . carvedilol  12.5 mg Oral BID WC  . chlorhexidine  15 mL Mouth Rinse BID  . colchicine  0.6 mg Oral Q48H  . estradiol  0.5 mg Oral Daily  . fluticasone  2 spray Each Nare QHS  . heparin  5,000 Units Subcutaneous Q8H  . hydrALAZINE  25 mg Oral Q8H  . lisinopril  20 mg Oral Daily   And  . hydrochlorothiazide  12.5 mg Oral Daily  . LORazepam  0.5 mg Intravenous QHS  . mouth rinse  15 mL Mouth Rinse q12n4p  . PARoxetine  20 mg Oral Daily  . polyethylene glycol  17 g Oral Daily  . risperiDONE  0.5 mg Oral BID  . sodium chloride flush  3 mL Intravenous Q12H  . torsemide  40 mg Oral Daily          Physical Exam: Vital signs in last 24 hours: Temp:  [97.7 F (36.5 C)-99.2 F (37.3 C)] 98.4 F (36.9 C) (05/02 1300) Pulse Rate:  [63-99] 63 (05/02 1300) Resp:  [18-20] 20 (05/02 1300) BP: (120-191)/(58-92) 136/81 (05/02 1300) SpO2:  [92 %-98 %] 93 % (05/02 1300) Weight change:  Last BM Date: 11/25/16  Intake/Output from previous day: 05/01 0701 - 05/02 0700 In: 6420 [P.O.:120; I.V.:6300] Out: -  Total I/O In: 120 [P.O.:120] Out: -    Physical Exam: General- pt is awake but not oriented. Resp- No acute REsp distress, NO Rhonchi CVS- S1S2 regular in rate and rhythm GIT- BS+, soft, NT, ND EXT- NO LE Edema, No Cyanosis   Lab Results: CBC  Recent Labs  11/24/16 1005  HCT 35.4     BMET  Recent Labs  11/24/16 1005  NA 145  K 3.9  CL 112*  CO2 22  GLUCOSE 109*  BUN 51*  CREATININE 4.13*  CALCIUM 9.2   Creat trend 2018 4.1--5.2( baseline)       6.7=>4.8=>4.5( last admission)  2017  2.3--4.7 2016 1.9--2.2 2015 1.3--3.4   Lab Results  Component Value Date   PTH 61 11/16/2016   PTH Comment 11/16/2016   CALCIUM 9.2 11/24/2016   CAION 1.15 06/21/2015   PHOS 3.1 11/19/2016               Impression: 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               CKD since 2015               CKD secondary to  DM                Progression of CKD marked with multiple episodes of AKI                Proteinura present.  2)HTN  Medication-  On Diuretics- On Alpha and beta Blockers  On Vasodilators-    3)Anemia HGb stable  4)CKD Mineral-Bone Disorder PTH acceptable  Secondary Hyperparathyroidism absent. Phosphorus at goal.   5)DM PMD following  6)Electrolytes Normokalemic NOrmonatremic   7)Acid base Co2 now at goal Acidosis now better    Plan:  Will continue current care.       Shadeland S 11/26/2016, 3:00 PM

## 2016-11-27 LAB — COMPREHENSIVE METABOLIC PANEL
ALK PHOS: 68 U/L (ref 38–126)
ALT: 28 U/L (ref 14–54)
AST: 13 U/L — AB (ref 15–41)
Albumin: 3.2 g/dL — ABNORMAL LOW (ref 3.5–5.0)
Anion gap: 11 (ref 5–15)
BUN: 48 mg/dL — AB (ref 6–20)
CHLORIDE: 117 mmol/L — AB (ref 101–111)
CO2: 19 mmol/L — ABNORMAL LOW (ref 22–32)
Calcium: 9.1 mg/dL (ref 8.9–10.3)
Creatinine, Ser: 3.8 mg/dL — ABNORMAL HIGH (ref 0.44–1.00)
GFR, EST AFRICAN AMERICAN: 15 mL/min — AB (ref >60)
GFR, EST NON AFRICAN AMERICAN: 13 mL/min — AB (ref >60)
Glucose, Bld: 109 mg/dL — ABNORMAL HIGH (ref 65–99)
Potassium: 3.3 mmol/L — ABNORMAL LOW (ref 3.5–5.1)
Sodium: 147 mmol/L — ABNORMAL HIGH (ref 135–145)
Total Bilirubin: 0.7 mg/dL (ref 0.3–1.2)
Total Protein: 6.1 g/dL — ABNORMAL LOW (ref 6.5–8.1)

## 2016-11-27 LAB — CBC WITH DIFFERENTIAL/PLATELET
BASOS PCT: 1 %
Basophils Absolute: 0 10*3/uL (ref 0.0–0.1)
EOS ABS: 0.1 10*3/uL (ref 0.0–0.7)
EOS PCT: 3 %
HCT: 33 % — ABNORMAL LOW (ref 36.0–46.0)
Hemoglobin: 10.9 g/dL — ABNORMAL LOW (ref 12.0–15.0)
LYMPHS ABS: 1.7 10*3/uL (ref 0.7–4.0)
Lymphocytes Relative: 33 %
MCH: 28.8 pg (ref 26.0–34.0)
MCHC: 33 g/dL (ref 30.0–36.0)
MCV: 87.1 fL (ref 78.0–100.0)
Monocytes Absolute: 0.6 10*3/uL (ref 0.1–1.0)
Monocytes Relative: 11 %
NEUTROS PCT: 52 %
Neutro Abs: 2.7 10*3/uL (ref 1.7–7.7)
PLATELETS: 154 10*3/uL (ref 150–400)
RBC: 3.79 MIL/uL — AB (ref 3.87–5.11)
RDW: 15.3 % (ref 11.5–15.5)
WBC: 5.2 10*3/uL (ref 4.0–10.5)

## 2016-11-27 LAB — GLUCOSE, CAPILLARY: GLUCOSE-CAPILLARY: 114 mg/dL — AB (ref 65–99)

## 2016-11-27 MED ORDER — DRONABINOL 2.5 MG PO CAPS
2.5000 mg | ORAL_CAPSULE | Freq: Two times a day (BID) | ORAL | Status: DC
  Administered 2016-11-27 – 2016-12-01 (×9): 2.5 mg via ORAL
  Filled 2016-11-27 (×9): qty 1

## 2016-11-27 MED ORDER — POTASSIUM CHLORIDE CRYS ER 20 MEQ PO TBCR
20.0000 meq | EXTENDED_RELEASE_TABLET | Freq: Two times a day (BID) | ORAL | Status: DC
  Administered 2016-11-27 – 2016-12-01 (×9): 20 meq via ORAL
  Filled 2016-11-27 (×9): qty 1

## 2016-11-27 MED ORDER — SODIUM CHLORIDE 0.45 % IV SOLN
INTRAVENOUS | Status: DC
  Administered 2016-11-27 – 2016-12-01 (×8): via INTRAVENOUS

## 2016-11-27 NOTE — NC FL2 (Signed)
[place] LEVEL OF CARE SCREENING TOOL     IDENTIFICATION  Patient Name: Martha [name] Birthdate: 1966/06/29 Sex: female Admission Date (Current Location): 11/20/2016  [facility] and [place] Number:  [employer] and Address:  [facility] [address] [place]      Provider Number: [id number]  Attending Physician Name and Address:  Sinda Du, MD  Relative Name and Phone Number:       Current Level of Care: Hospital Recommended Level of Care: [place] Prior Approval Number:    Date Approved/Denied:   PASRR Number: [id number] (effective 11/27/16-12/27/16) ([id number] E (effective 11/27/16-12/27/16))  Discharge Plan: SNF    Current Diagnoses: Patient Active Problem List   Diagnosis Date Noted  . Confusion 11/22/2016  . HTN (hypertension), malignant 11/20/2016  . Diabetes mellitus 11/20/2016  . Bipolar 1 disorder ([place]) 11/20/2016  . Noncompliance with medication regimen 11/20/2016  . Panic attack 11/20/2016  . CKD stage 5 secondary to hypertension ([place]) 11/15/2016  . AKI (acute kidney injury) ([place]) 11/15/2016  . Chest pain 10/27/2016  . CKD (chronic kidney disease) stage 5, GFR less than 15 ml/min (HCC) 10/27/2016  . Uncontrolled type 2 diabetes mellitus with hyperglycemia, without long-term current use of insulin ([place]) 10/27/2016  . Sensory disturbance 10/27/2016  . Left hemiparesis ([place]) 10/27/2016  . Acute on chronic renal failure ([place]) 05/25/2016  . Abdominal pain 04/26/2014  . Cholelithiasis 04/26/2014  . Acute renal failure ([place]) 04/26/2014  . Dehydration 04/26/2014  . Hyponatremia 04/26/2014  . Gallstones 04/06/2014  . Uncontrolled diabetes mellitus ([place]) 10/09/2013  . Breast abscess 05/30/2013  . Diabetes mellitus, type 2 ([place]) 05/30/2013  . Hyperglycemia 05/30/2013  . Internal hemorrhoids with other complication 50/53/9767  . Umbilical hernia without mention of obstruction or gangrene 04/15/2013   . Anemia 04/15/2013  . DM 05/13/2010  . Hyperlipidemia 05/13/2010  . OBESITY 05/13/2010  . Depression 05/13/2010  . RESTLESS LEG SYNDROME 05/13/2010  . Essential hypertension 05/13/2010  . GERD 05/13/2010  . RECTAL BLEEDING 05/13/2010  . Sleep apnea 05/13/2010  . Dysphagia, oropharyngeal phase 05/13/2010    Orientation RESPIRATION BLADDER Height & Weight     Self   (CPAP at night) Incontinent Weight: 258 lb 9.6 oz (117.3 kg) Height:  5\' 8"  (172.7 cm)  BEHAVIORAL SYMPTOMS/MOOD NEUROLOGICAL BOWEL NUTRITION STATUS      Incontinent Diet (Heart Healthy)  AMBULATORY STATUS COMMUNICATION OF NEEDS Skin   Extensive Assist Verbally Normal                       Personal Care Assistance Level of Assistance  Bathing, Feeding, Dressing Bathing Assistance: Maximum assistance Feeding assistance: Limited assistance Dressing Assistance: Maximum assistance     Functional Limitations Info  Sight, Hearing, Speech Sight Info: Adequate Hearing Info: Adequate Speech Info: Adequate    SPECIAL CARE FACTORS FREQUENCY  PT (By licensed PT)     PT Frequency: 5x/week              Contractures Contractures Info: Not present    Additional Factors Info  Code Status, Allergies, Psychotropic Code Status Info: Full Allergies Info: Codeine Psychotropic Info: Xanax, Paxil         Current Medications (11/27/2016):  This is the current hospital active medication list Current Facility-Administered Medications  Medication Dose Route Frequency Provider Last Rate Last Dose  . 0.45 % sodium chloride infusion   Intravenous Continuous Fran Lowes, MD      .  albuterol (PROVENTIL) (2.5 MG/3ML) 0.083% nebulizer solution 2.5 mg  2.5 mg Inhalation TID Orvan Falconer, MD   2.5 mg at 11/27/16 0739  . albuterol (PROVENTIL) (2.5 MG/3ML) 0.083% nebulizer solution 3 mL  3 mL Inhalation Q4H PRN Orvan Falconer, MD      . ALPRAZolam Duanne Moron) tablet 0.25 mg  0.25 mg Oral TID Sinda Du, MD   0.25 mg at 11/27/16  0914  . atorvastatin (LIPITOR) tablet 10 mg  10 mg Oral Daily Orvan Falconer, MD   10 mg at 11/27/16 0914  . atorvastatin (LIPITOR) tablet 20 mg  20 mg Oral Daily Orvan Falconer, MD   20 mg at 11/27/16 0916  . carvedilol (COREG) tablet 12.5 mg  12.5 mg Oral BID WC Orvan Falconer, MD   12.5 mg at 11/27/16 0914  . chlorhexidine (PERIDEX) 0.12 % solution 15 mL  15 mL Mouth Rinse BID Orvan Falconer, MD   15 mL at 11/27/16 0913  . colchicine tablet 0.6 mg  0.6 mg Oral Q48H Orvan Falconer, MD   0.6 mg at 11/27/16 0914  . dronabinol (MARINOL) capsule 2.5 mg  2.5 mg Oral BID AC Sinda Du, MD      . estradiol (ESTRACE) tablet 0.5 mg  0.5 mg Oral Daily Orvan Falconer, MD   0.5 mg at 11/27/16 0913  . fluticasone (FLONASE) 50 MCG/ACT nasal spray 2 spray  2 spray Each Nare QHS Orvan Falconer, MD   2 spray at 11/26/16 2136  . heparin injection 5,000 Units  5,000 Units Subcutaneous Q8H Orvan Falconer, MD   5,000 Units at 11/27/16 0532  . hydrALAZINE (APRESOLINE) tablet 25 mg  25 mg Oral Q8H Orvan Falconer, MD   25 mg at 11/27/16 0529  . lisinopril (PRINIVIL,ZESTRIL) tablet 20 mg  20 mg Oral Daily Sinda Du, MD   20 mg at 11/27/16 8366   And  . hydrochlorothiazide (MICROZIDE) capsule 12.5 mg  12.5 mg Oral Daily Sinda Du, MD   12.5 mg at 11/27/16 0914  . labetalol (NORMODYNE,TRANDATE) injection 10 mg  10 mg Intravenous Q2H PRN Orvan Falconer, MD   10 mg at 11/26/16 0455  . loperamide (IMODIUM) capsule 2 mg  2 mg Oral QID PRN Orvan Falconer, MD      . LORazepam (ATIVAN) injection 0.5 mg  0.5 mg Intravenous QHS Sinda Du, MD   0.5 mg at 11/26/16 2136  . MEDLINE mouth rinse  15 mL Mouth Rinse q12n4p Orvan Falconer, MD   15 mL at 11/21/16 1600  . metoCLOPramide (REGLAN) tablet 10 mg  10 mg Oral Q6H PRN Orvan Falconer, MD      . PARoxetine (PAXIL) tablet 20 mg  20 mg Oral Daily Sinda Du, MD   20 mg at 11/27/16 0913  . polyethylene glycol (MIRALAX / GLYCOLAX) packet 17 g  17 g Oral Daily Orvan Falconer, MD   17 g at 11/27/16 0916  . potassium chloride SA (K-DUR,KLOR-CON) CR  tablet 20 mEq  20 mEq Oral BID Sinda Du, MD   20 mEq at 11/27/16 0926  . risperiDONE (RISPERDAL) tablet 0.5 mg  0.5 mg Oral BID Sinda Du, MD   0.5 mg at 11/27/16 0914  . sodium chloride flush (NS) 0.9 % injection 3 mL  3 mL Intravenous Q12H Orvan Falconer, MD   3 mL at 11/27/16 0916  . torsemide (DEMADEX) tablet 40 mg  40 mg Oral Daily Orvan Falconer, MD   40 mg at 11/27/16 2947     Discharge Medications: Please see discharge  summary for a list of discharge medications.  Relevant Imaging Results:  Relevant Lab Results:   Additional Information SSN [id number] Clydene Pugh, LCSW

## 2016-11-27 NOTE — Care Management Note (Signed)
Case Management Note  Patient Details  Name: Martha [name] MRN: [id number] Date of Birth: Dec 24, 1965  If discussed at Long Length of Stay Meetings, dates discussed:  11/27/2016   Sherald Barge, RN 11/27/2016, 10:50 AM

## 2016-11-27 NOTE — Clinical Social Work Note (Signed)
LCSW met with patient's fiance, Lucienne Capers, and daughter, Randal Buba, at patient's bedside. LCSW was advised that patient would have to stay for 30 days and that her check would be going to the facility while patient was in placement. Both parties were agreeable. Patient's fiance also advised that he was open to the facilty on [employer] that he had previously been opposed to.    Camree Wigington, Clydene Pugh, LCSW

## 2016-11-27 NOTE — Clinical Social Work Note (Signed)
Clinical Social Work Assessment  Patient Details  Name: Martha [name] MRN: [id number] Date of Birth: 12-30-1965  Date of referral:  11/27/16               Reason for consult:  Discharge Planning                Permission sought to share information with:    Permission granted to share information::     Name::        Agency::     Relationship::     Contact Information:  Martha [name], fiance; and Martha [name], dtr.  Housing/Transportation Living arrangements for the past 2 months:  [place] of Information:  Adult Children, Other (Comment Required) (Fiance, Martha [name]) Patient Interpreter Needed:  None Criminal Activity/Legal Involvement Pertinent to Current Situation/Hospitalization:  No - Comment as needed Significant Relationships:  Adult Children, Significant Other Lives with:  Self, Significant Other Do you feel safe going back to the place where you live?  Yes Need for family participation in patient care:  Yes (Comment)  Care giving concerns: At baseline, patient required guidance in ambulation from her fiance.  She was able to bathe herself with assistance getting in and out of the bath tub.      Social Worker assessment / plan: Patient's daughter deferred decisions to patient's fiance.  Mr. Martha [name] stated that he was open to patient going to a SNF as long as it was not the one on [address]. LCSW advise that it may not be in the [place] area due to patient only having Medicaid as her payor source.     Employment status:  Unemployed Forensic scientist:  Medicaid In [place] PT Recommendations:  [place] / Referral to community resources:  [place]  Patient/Family's Response to care:  Family is agreeable to go to SNF.  Patient/Family's Understanding of and Emotional Response to Diagnosis, Current Treatment, and Prognosis:  Family understands her diagnosis, treatment and prognosis.   Emotional  Assessment Appearance:  Appears stated age Attitude/Demeanor/Rapport:  Unable to Assess Affect (typically observed):  Unable to Assess Orientation:  Oriented to Self Alcohol / Substance use:  Not Applicable Psych involvement (Current and /or in the community):  No (Comment)  Discharge Needs  Concerns to be addressed:  No discharge needs identified Readmission within the last 30 days:  No Current discharge risk:  None Barriers to Discharge:  Other (Patient is Medicaid Only)   Ihor Gully, LCSW 11/27/2016, 9:29 AM

## 2016-11-27 NOTE — Progress Notes (Signed)
Physical Therapy Treatment Patient Details Name: Martha [name] MRN: [id number] DOB: [birth year] Today's Date: 11/27/2016    History of Present Illness 51 y.o.femalewith medical history significant of CKD stage 5, COPD, HTN, DM, OSA, just discharged yesterday after several admission for AKI, given IVF with improved, returned to the ER with complaints of not feeling well, having visual hallucination intermittently.  Evaluation in the ER showed BP 210/115, and she was started on IV Cardene, with subsequent BP of 846 systolic.  Work up included creatinine at baseline, K ok, and no leukocytosis.  Hospitalist was asked to admit her for HTN urgency.  She has significant hx of psychiatric diagnosis as well, and TTS was consulted.  Dx: Unexplained encephalopathy but the picture seems most consistent with the toxic metabolic processes most likely from worsening renal failure.     PT Comments    Pt received in bed, and was agreeable to PT tx.  Pt continues to require Max/Total A for transfer supine<>sit.  Worked on midline awareness today.  Pt has a tendency to push to the right, therefore, assisted pt with lateral leans to the left and weight bearing through the elbow.  While performing this task, pt has great difficulty keeping her LE's on the floor, and requires assistance to do so.  She also startles very easily.  She continues to be confused, however speech is improving more today.  Continue to recommend SNF at this time.      Follow Up Recommendations  SNF;Supervision/Assistance - 24 hour     Equipment Recommendations       Recommendations for Other Services       Precautions / Restrictions Precautions Precautions: Fall (B safety mitts) Precaution Comments: 2 falls - Oct 29 was walking into the house and she stumbled, The other time she was goign to the bathroom, and tripped on her flip flop bedroom shoes. 1 additional fall here in the hospital last admission.  Restrictions Weight Bearing  Restrictions: No    Mobility  Bed Mobility Overal bed mobility: Needs Assistance Bed Mobility: Supine to Sit     Supine to sit: Max assist;Total assist;HOB elevated     General bed mobility comments: Pt assisted with static sitting balance today.    Transfers Overall transfer level:  (NA due to poor static sitting balance)                  Ambulation/Gait                 Stairs            Wheelchair Mobility    Modified Rankin (Stroke Patients Only)       Balance Overall balance assessment: History of Falls;Needs assistance Sitting-balance support: Bilateral upper extremity supported;Feet supported   Sitting balance - Comments: Pt demostrates strong push to the right with head turned to the right, and posterior lean.  Pt assisted with lateral weight shifitng down to elbow on the left only to promote midline awareness.  Pt startles easily, and has difficulty keeping feet down on the ground during this exercise.  She requires constant max A for static sitting balance.                                      Cognition   Behavior During Therapy: Impulsive;Restless Overall Cognitive Status: Impaired/Different from baseline Area of Impairment: Orientation  Orientation Level: Place;Disoriented to;Time             General Comments: States she is at home      Exercises      General Comments        Pertinent Vitals/Pain Pain Assessment:  (Pt states she has pain all over)    Home Living                      Prior Function            PT Goals (current goals can now be found in the care plan section) Acute Rehab PT Goals Patient Stated Goal: None stated PT Goal Formulation: With family Time For Goal Achievement: 12/09/16 Potential to Achieve Goals: Poor Progress towards PT goals: Progressing toward goals    Frequency    Min 6X/week      PT Plan Current plan remains appropriate     Co-evaluation              AM-PAC PT "6 Clicks" Daily Activity  Outcome Measure  Difficulty turning over in bed (including adjusting bedclothes, sheets and blankets)?: A Lot Difficulty moving from lying on back to sitting on the side of the bed? : A Lot Difficulty sitting down on and standing up from a chair with arms (e.g., wheelchair, bedside commode, etc,.)?: Total Help needed moving to and from a bed to chair (including a wheelchair)?: Total Help needed walking in hospital room?: Total Help needed climbing 3-5 steps with a railing? : Total 6 Click Score: 8    End of Session   Activity Tolerance: Other (comment) (mental status) Patient left: in bed;with family/visitor present   PT Visit Diagnosis: Muscle weakness (generalized) (M62.81);Other abnormalities of gait and mobility (R26.89);History of falling (Z91.81);Other symptoms and signs involving the nervous system (R29.898)     Time: 1583-0940 PT Time Calculation (min) (ACUTE ONLY): 15 min  Charges:  $Therapeutic Activity: 8-22 mins                    G Codes:       Beth Nakina Spatz, PT, DPT X: [phone]

## 2016-11-27 NOTE — Progress Notes (Signed)
Subjective: She is still not eating. She seems a little more alert. Still very confused.  Objective: Vital signs in last 24 hours: Temp:  [98.3 F (36.8 C)-98.4 F (36.9 C)] 98.4 F (36.9 C) (05/03 0522) Pulse Rate:  [63-79] 73 (05/03 0522) Resp:  [15-20] 15 (05/03 0522) BP: (136-171)/(79-84) 171/84 (05/03 0522) SpO2:  [93 %-99 %] 94 % (05/03 0743) Weight change:  Last BM Date: 11/25/16  Intake/Output from previous day: 05/02 0701 - 05/03 0700 In: 1020 [P.O.:120; I.V.:900] Out: -   PHYSICAL EXAM General appearance: alert, cooperative and no distress Resp: clear to auscultation bilaterally Cardio: regular rate and rhythm, S1, S2 normal, no murmur, click, rub or gallop GI: soft, non-tender; bowel sounds normal; no masses,  no organomegaly Extremities: extremities normal, atraumatic, no cyanosis or edema Skin warm and dry  Lab Results:  Results for orders placed or performed during the hospital encounter of 11/20/16 (from the past 48 hour(s))  Comprehensive metabolic panel     Status: Abnormal   Collection Time: 11/27/16  5:29 AM  Result Value Ref Range   Sodium 147 (H) 135 - 145 mmol/L   Potassium 3.3 (L) 3.5 - 5.1 mmol/L   Chloride 117 (H) 101 - 111 mmol/L   CO2 19 (L) 22 - 32 mmol/L   Glucose, Bld 109 (H) 65 - 99 mg/dL   BUN 48 (H) 6 - 20 mg/dL   Creatinine, Ser 3.80 (H) 0.44 - 1.00 mg/dL   Calcium 9.1 8.9 - 10.3 mg/dL   Total Protein 6.1 (L) 6.5 - 8.1 g/dL   Albumin 3.2 (L) 3.5 - 5.0 g/dL   AST 13 (L) 15 - 41 U/L   ALT 28 14 - 54 U/L   Alkaline Phosphatase 68 38 - 126 U/L   Total Bilirubin 0.7 0.3 - 1.2 mg/dL   GFR calc non Af Amer 13 (L) >60 mL/min   GFR calc Af Amer 15 (L) >60 mL/min    Comment: (NOTE) The eGFR has been calculated using the CKD EPI equation. This calculation has not been validated in all clinical situations. eGFR's persistently <60 mL/min signify possible Chronic Kidney Disease.    Anion gap 11 5 - 15  CBC with Differential/Platelet      Status: Abnormal   Collection Time: 11/27/16  5:29 AM  Result Value Ref Range   WBC 5.2 4.0 - 10.5 K/uL   RBC 3.79 (L) 3.87 - 5.11 MIL/uL   Hemoglobin 10.9 (L) 12.0 - 15.0 g/dL   HCT 33.0 (L) 36.0 - 46.0 %   MCV 87.1 78.0 - 100.0 fL   MCH 28.8 26.0 - 34.0 pg   MCHC 33.0 30.0 - 36.0 g/dL   RDW 15.3 11.5 - 15.5 %   Platelets 154 150 - 400 K/uL   Neutrophils Relative % 52 %   Neutro Abs 2.7 1.7 - 7.7 K/uL   Lymphocytes Relative 33 %   Lymphs Abs 1.7 0.7 - 4.0 K/uL   Monocytes Relative 11 %   Monocytes Absolute 0.6 0.1 - 1.0 K/uL   Eosinophils Relative 3 %   Eosinophils Absolute 0.1 0.0 - 0.7 K/uL   Basophils Relative 1 %   Basophils Absolute 0.0 0.0 - 0.1 K/uL    ABGS No results for input(s): PHART, PO2ART, TCO2, HCO3 in the last 72 hours.  Invalid input(s): PCO2 CULTURES Recent Results (from the past 240 hour(s))  Urine culture     Status: Abnormal   Collection Time: 11/22/16 10:55 PM  Result Value Ref  Range Status   Specimen Description URINE, CLEAN CATCH  Final   Special Requests NONE  Final   Culture MULTIPLE SPECIES PRESENT, SUGGEST RECOLLECTION (A)  Final   Report Status 11/24/2016 FINAL  Final   Studies/Results: No results found.  Medications:  Prior to Admission:  Prescriptions Prior to Admission  Medication Sig Dispense Refill Last Dose  . albuterol (PROVENTIL HFA;VENTOLIN HFA) 108 (90 Base) MCG/ACT inhaler Inhale 2 puffs into the lungs every 4 (four) hours as needed for wheezing or shortness of breath. 1 Inhaler 0 11/20/2016 at Unknown time  . albuterol (PROVENTIL) (2.5 MG/3ML) 0.083% nebulizer solution Inhale 3 mLs (2.5 mg total) into the lungs 3 (three) times daily. 75 mL 12 11/20/2016 at Unknown time  . ALPRAZolam (XANAX) 1 MG tablet Take 1 mg by mouth 4 (four) times daily.     11/20/2016 at Unknown time  . atorvastatin (LIPITOR) 20 MG tablet Take 20 mg by mouth daily. Take along with 10 mg tablet to equal 30 mg daily.   11/19/2016 at Unknown time  . baclofen  (LIORESAL) 20 MG tablet Take 20 mg by mouth 3 (three) times daily as needed for muscle spasms.   11/19/2016 at Unknown time  . carvedilol (COREG) 12.5 MG tablet Take 1 tablet (12.5 mg total) by mouth 2 (two) times daily with a meal. 60 tablet 0 11/20/2016 at 1000  . colchicine 0.6 MG tablet Take 1 tablet (0.6 mg total) by mouth every other day. 16 tablet 12   . estradiol (ESTRACE) 0.5 MG tablet Take 0.5 mg by mouth daily.     . fluticasone (FLONASE) 50 MCG/ACT nasal spray Place 2 sprays into both nostrils at bedtime.   11/19/2016 at Unknown time  . hydrALAZINE (APRESOLINE) 25 MG tablet Take 1 tablet (25 mg total) by mouth every 8 (eight) hours. 90 tablet 12 11/20/2016 at Unknown time  . HYDROcodone-acetaminophen (NORCO) 10-325 MG tablet Take 1 tablet by mouth every 4 (four) hours as needed for moderate pain.   11/20/2016 at Unknown time  . lisinopril-hydrochlorothiazide (PRINZIDE,ZESTORETIC) 20-12.5 MG tablet Take 1 tablet by mouth daily.   11/20/2016 at Unknown time  . loperamide (IMODIUM) 2 MG capsule Take 1 capsule (2 mg total) by mouth 4 (four) times daily as needed for diarrhea or loose stools. 12 capsule 0 11/20/2016 at Unknown time  . metoCLOPramide (REGLAN) 10 MG tablet Take 1 tablet (10 mg total) by mouth every 6 (six) hours as needed for nausea (or headache). 30 tablet 0 11/20/2016 at Unknown time  . PARoxetine (PAXIL) 20 MG tablet Take 1 daily. This is to prevent panic attacks (Patient taking differently: Take 40 mg by mouth daily. Take 1 daily. This is to prevent panic attacks) 30 tablet 12 11/19/2016 at Unknown time  . polyethylene glycol (MIRALAX / GLYCOLAX) packet Take 17 g by mouth daily. 14 each 0 Past Week at Unknown time  . torsemide (DEMADEX) 20 MG tablet Take 2 tablets (40 mg total) by mouth daily. 60 tablet 12 11/19/2016 at Unknown time  . atorvastatin (LIPITOR) 10 MG tablet Take 10 mg by mouth daily. Take along with 20 mg tablet to equal 30 mg daily.   Not Taking at Unknown time  .  rOPINIRole (REQUIP) 1 MG tablet Take 1 mg by mouth at bedtime.    Not Taking at Unknown time   Scheduled: . albuterol  2.5 mg Inhalation TID  . ALPRAZolam  0.25 mg Oral TID  . atorvastatin  10 mg Oral Daily  .  atorvastatin  20 mg Oral Daily  . carvedilol  12.5 mg Oral BID WC  . chlorhexidine  15 mL Mouth Rinse BID  . colchicine  0.6 mg Oral Q48H  . estradiol  0.5 mg Oral Daily  . fluticasone  2 spray Each Nare QHS  . heparin  5,000 Units Subcutaneous Q8H  . hydrALAZINE  25 mg Oral Q8H  . lisinopril  20 mg Oral Daily   And  . hydrochlorothiazide  12.5 mg Oral Daily  . LORazepam  0.5 mg Intravenous QHS  . mouth rinse  15 mL Mouth Rinse q12n4p  . PARoxetine  20 mg Oral Daily  . polyethylene glycol  17 g Oral Daily  . risperiDONE  0.5 mg Oral BID  . sodium chloride flush  3 mL Intravenous Q12H  . torsemide  40 mg Oral Daily   Continuous: . sodium chloride 75 mL/hr at 11/27/16 0300   WUJ:[id number], labetalol, loperamide, metoCLOPramide  Assesment:She is admitted with hallucinations. It's not quite clear what happened. This is likely multifactorial. We have been reducing medications that might be psychoactive. At baseline she has a history of bipolar disease and that may be the issue. No evidence of seizures. No evidence of stroke. No evidence of infection. She has end-stage renal disease approaching dialysis. Renal function is better today. Principal Problem:   HTN (hypertension), malignant Active Problems:   Diabetes mellitus   Bipolar 1 disorder ([place])   Noncompliance with medication regimen   Panic attack   Confusion    Plan: Continue treatments. Try to add some sort of appetite stimulant and see if it will help    LOS: 5 days   Martha [name] L 11/27/2016, 9:06 AM

## 2016-11-27 NOTE — Clinical Social Work Placement (Signed)
   CLINICAL SOCIAL WORK PLACEMENT  NOTE  Date:  11/27/2016  Patient Details  Name: Martha [name] MRN: [id number] Date of Birth: [birth year]  Clinical Social Work is seeking post-discharge placement for this patient at the [place] level of care (*CSW will initial, date and re-position this form in  chart as items are completed):  Yes   Patient/family provided with [facility] Work Department's list of facilities offering this level of care within the geographic area requested by the patient (or if unable, by the patient's family).  Yes   Patient/family informed of their freedom to choose among providers that offer the needed level of care, that participate in Medicare, Medicaid or managed care program needed by the patient, have an available bed and are willing to accept the patient.  Yes   Patient/family informed of [facility]'s ownership interest in [facility] and [facility], as well as of the fact that they are under no obligation to receive care at these facilities.  PASRR submitted to EDS on 11/25/16     PASRR number received on       Existing PASRR number confirmed on       FL2 transmitted to all facilities in geographic area requested by pt/family on 11/27/16     FL2 transmitted to all facilities within larger geographic area on       Patient informed that his/her managed care company has contracts with or will negotiate with certain facilities, including the following:            Patient/family informed of bed offers received.  Patient chooses bed at       Physician recommends and patient chooses bed at      Patient to be transferred to   on  .  Patient to be transferred to facility by       Patient family notified on   of transfer.  Name of family member notified:        PHYSICIAN       Additional Comment:    _______________________________________________ Ihor Gully, LCSW 11/27/2016, 9:12 AM

## 2016-11-27 NOTE — Progress Notes (Signed)
Subjective: Interval History: has complaints of soreness of her tongue. She denies any nausea or vomiting. Her appetite however remains poor..  Objective: Vital signs in last 24 hours: Temp:  [98.3 F (36.8 C)-98.4 F (36.9 C)] 98.4 F (36.9 C) (05/03 0522) Pulse Rate:  [63-79] 76 (05/03 0914) Resp:  [15-20] 15 (05/03 0522) BP: (130-171)/(70-84) 130/70 (05/03 0914) SpO2:  [93 %-99 %] 94 % (05/03 0743) Weight change:   Intake/Output from previous day: 05/02 0701 - 05/03 0700 In: 1020 [P.O.:120; I.V.:900] Out: -  Intake/Output this shift: Total I/O In: 3 [I.V.:3] Out: -   General appearance: alert, cooperative, delirious, no distress and moderately obese Resp: clear to auscultation bilaterally Cardio: regular rate and rhythm Extremities: No edema  Lab Results:  Recent Labs  11/27/16 0529  WBC 5.2  HGB 10.9*  HCT 33.0*  PLT 154   BMET:  Recent Labs  11/27/16 0529  NA 147*  K 3.3*  CL 117*  CO2 19*  GLUCOSE 109*  BUN 48*  CREATININE 3.80*  CALCIUM 9.1   No results for input(s): PTH in the last 72 hours. Iron Studies: No results for input(s): IRON, TIBC, TRANSFERRIN, FERRITIN in the last 72 hours.  Studies/Results: No results found.  I have reviewed the patient's current medications.  Assessment/Plan: Problem #1 acute kidney injury superimposed on chronic. Presently her BUN and creatinine is continuously improving. Problem #2 chronic renal failure: Stage V Problem #3 hallucination: Patient at this moment seems to be somewhat better, restless, but answer questions. Problem #4 hypokalemia: Potassium is low Problem #5 hypernatremia: Possibly from lack of free water Problem #6 anemia: Her hemoglobin and hematocrit stable  Problem #7 history of bipolar disorder Plan: 1]We will change her IV fluid to half normal saline at 75 mL per hour 2] agree with potassium supplement 3] we'll check her renal panel and CBC in the morning.    LOS: 5 days    Martha [name] S 11/27/2016,10:18 AM

## 2016-11-27 NOTE — NC FL2 (Deleted)
[place] LEVEL OF CARE SCREENING TOOL     IDENTIFICATION  Patient Name: Martha [name] Birthdate: June 20, 1966 Sex: female Admission Date (Current Location): 11/20/2016  [facility] and [place] Number:  [employer] and Address:  [place] [address], [place]      Provider Number: [id number]  Attending Physician Name and Address:  Sinda Du, MD  Relative Name and Phone Number:       Current Level of Care: Hospital Recommended Level of Care: [place] Prior Approval Number:    Date Approved/Denied:   PASRR Number:    Discharge Plan: SNF    Current Diagnoses: Patient Active Problem List   Diagnosis Date Noted  . Confusion 11/22/2016  . HTN (hypertension), malignant 11/20/2016  . Diabetes mellitus 11/20/2016  . Bipolar 1 disorder ([place]) 11/20/2016  . Noncompliance with medication regimen 11/20/2016  . Panic attack 11/20/2016  . CKD stage 5 secondary to hypertension ([place]) 11/15/2016  . AKI (acute kidney injury) ([place]) 11/15/2016  . Chest pain 10/27/2016  . CKD (chronic kidney disease) stage 5, GFR less than 15 ml/min (HCC) 10/27/2016  . Uncontrolled type 2 diabetes mellitus with hyperglycemia, without long-term current use of insulin ([place]) 10/27/2016  . Sensory disturbance 10/27/2016  . Left hemiparesis ([place]) 10/27/2016  . Acute on chronic renal failure ([place]) 05/25/2016  . Abdominal pain 04/26/2014  . Cholelithiasis 04/26/2014  . Acute renal failure ([place]) 04/26/2014  . Dehydration 04/26/2014  . Hyponatremia 04/26/2014  . Gallstones 04/06/2014  . Uncontrolled diabetes mellitus ([place]) 10/09/2013  . Breast abscess 05/30/2013  . Diabetes mellitus, type 2 ([place]) 05/30/2013  . Hyperglycemia 05/30/2013  . Internal hemorrhoids with other complication 09/47/0962  . Umbilical hernia without mention of obstruction or gangrene 04/15/2013  . Anemia 04/15/2013  . DM 05/13/2010  . Hyperlipidemia 05/13/2010  .  OBESITY 05/13/2010  . Depression 05/13/2010  . RESTLESS LEG SYNDROME 05/13/2010  . Essential hypertension 05/13/2010  . GERD 05/13/2010  . RECTAL BLEEDING 05/13/2010  . Sleep apnea 05/13/2010  . Dysphagia, oropharyngeal phase 05/13/2010    Orientation RESPIRATION BLADDER Height & Weight     Self   (CPAP at night) Incontinent Weight: 258 lb 9.6 oz (117.3 kg) Height:  5\' 8"  (172.7 cm)  BEHAVIORAL SYMPTOMS/MOOD NEUROLOGICAL BOWEL NUTRITION STATUS      Incontinent Diet (Heart Healthy)  AMBULATORY STATUS COMMUNICATION OF NEEDS Skin   Extensive Assist Verbally Normal                       Personal Care Assistance Level of Assistance  Bathing, Feeding, Dressing Bathing Assistance: Maximum assistance Feeding assistance: Limited assistance Dressing Assistance: Maximum assistance     Functional Limitations Info  Sight, Hearing, Speech Sight Info: Adequate Hearing Info: Adequate Speech Info: Adequate    SPECIAL CARE FACTORS FREQUENCY  PT (By licensed PT)     PT Frequency: 5x/week              Contractures Contractures Info: Not present    Additional Factors Info  Code Status, Allergies, Psychotropic Code Status Info: Full Allergies Info: Codeine Psychotropic Info: Xanax, Paxil         Current Medications (11/27/2016):  This is the current hospital active medication list Current Facility-Administered Medications  Medication Dose Route Frequency Provider Last Rate Last Dose  . 0.9 %  sodium chloride infusion   Intravenous Continuous Sinda Du, MD 75 mL/hr at 11/27/16 0300    .  albuterol (PROVENTIL) (2.5 MG/3ML) 0.083% nebulizer solution 2.5 mg  2.5 mg Inhalation TID Orvan Falconer, MD   2.5 mg at 11/27/16 0739  . albuterol (PROVENTIL) (2.5 MG/3ML) 0.083% nebulizer solution 3 mL  3 mL Inhalation Q4H PRN Orvan Falconer, MD      . ALPRAZolam Duanne Moron) tablet 0.25 mg  0.25 mg Oral TID Sinda Du, MD   0.25 mg at 11/26/16 2135  . atorvastatin (LIPITOR) tablet 10 mg  10 mg  Oral Daily Orvan Falconer, MD   10 mg at 11/26/16 0956  . atorvastatin (LIPITOR) tablet 20 mg  20 mg Oral Daily Orvan Falconer, MD   20 mg at 11/26/16 1001  . carvedilol (COREG) tablet 12.5 mg  12.5 mg Oral BID WC Orvan Falconer, MD   12.5 mg at 11/26/16 1522  . chlorhexidine (PERIDEX) 0.12 % solution 15 mL  15 mL Mouth Rinse BID Orvan Falconer, MD   15 mL at 11/26/16 2136  . colchicine tablet 0.6 mg  0.6 mg Oral Q48H Orvan Falconer, MD   0.6 mg at 11/25/16 0810  . estradiol (ESTRACE) tablet 0.5 mg  0.5 mg Oral Daily Orvan Falconer, MD   0.5 mg at 11/26/16 0958  . fluticasone (FLONASE) 50 MCG/ACT nasal spray 2 spray  2 spray Each Nare QHS Orvan Falconer, MD   2 spray at 11/26/16 2136  . heparin injection 5,000 Units  5,000 Units Subcutaneous Q8H Orvan Falconer, MD   5,000 Units at 11/27/16 0532  . hydrALAZINE (APRESOLINE) tablet 25 mg  25 mg Oral Q8H Orvan Falconer, MD   25 mg at 11/27/16 0529  . lisinopril (PRINIVIL,ZESTRIL) tablet 20 mg  20 mg Oral Daily Sinda Du, MD   20 mg at 11/26/16 0957   And  . hydrochlorothiazide (MICROZIDE) capsule 12.5 mg  12.5 mg Oral Daily Sinda Du, MD   12.5 mg at 11/26/16 0957  . labetalol (NORMODYNE,TRANDATE) injection 10 mg  10 mg Intravenous Q2H PRN Orvan Falconer, MD   10 mg at 11/26/16 0455  . loperamide (IMODIUM) capsule 2 mg  2 mg Oral QID PRN Orvan Falconer, MD      . LORazepam (ATIVAN) injection 0.5 mg  0.5 mg Intravenous QHS Sinda Du, MD   0.5 mg at 11/26/16 2136  . MEDLINE mouth rinse  15 mL Mouth Rinse q12n4p Orvan Falconer, MD   15 mL at 11/21/16 1600  . metoCLOPramide (REGLAN) tablet 10 mg  10 mg Oral Q6H PRN Orvan Falconer, MD      . PARoxetine (PAXIL) tablet 20 mg  20 mg Oral Daily Sinda Du, MD   20 mg at 11/26/16 0957  . polyethylene glycol (MIRALAX / GLYCOLAX) packet 17 g  17 g Oral Daily Orvan Falconer, MD   17 g at 11/26/16 1000  . risperiDONE (RISPERDAL) tablet 0.5 mg  0.5 mg Oral BID Sinda Du, MD   0.5 mg at 11/26/16 2135  . sodium chloride flush (NS) 0.9 % injection 3 mL  3 mL Intravenous Q12H  Orvan Falconer, MD   3 mL at 11/26/16 2137  . torsemide (DEMADEX) tablet 40 mg  40 mg Oral Daily Orvan Falconer, MD   40 mg at 11/26/16 1443     Discharge Medications: Please see discharge summary for a list of discharge medications.  Relevant Imaging Results:  Relevant Lab Results:   Additional Information SSN [id number], Clydene Pugh, LCSW

## 2016-11-28 LAB — RENAL FUNCTION PANEL
ANION GAP: 8 (ref 5–15)
Albumin: 3.3 g/dL — ABNORMAL LOW (ref 3.5–5.0)
BUN: 43 mg/dL — ABNORMAL HIGH (ref 6–20)
CHLORIDE: 113 mmol/L — AB (ref 101–111)
CO2: 23 mmol/L (ref 22–32)
Calcium: 9 mg/dL (ref 8.9–10.3)
Creatinine, Ser: 3.73 mg/dL — ABNORMAL HIGH (ref 0.44–1.00)
GFR calc Af Amer: 15 mL/min — ABNORMAL LOW (ref >60)
GFR calc non Af Amer: 13 mL/min — ABNORMAL LOW (ref >60)
GLUCOSE: 117 mg/dL — AB (ref 65–99)
PHOSPHORUS: 2.2 mg/dL — AB (ref 2.5–4.6)
POTASSIUM: 3.4 mmol/L — AB (ref 3.5–5.1)
SODIUM: 144 mmol/L (ref 135–145)

## 2016-11-28 LAB — CBC
HEMATOCRIT: 33.6 % — AB (ref 36.0–46.0)
HEMOGLOBIN: 11.1 g/dL — AB (ref 12.0–15.0)
MCH: 28.5 pg (ref 26.0–34.0)
MCHC: 33 g/dL (ref 30.0–36.0)
MCV: 86.4 fL (ref 78.0–100.0)
Platelets: 155 10*3/uL (ref 150–400)
RBC: 3.89 MIL/uL (ref 3.87–5.11)
RDW: 15.1 % (ref 11.5–15.5)
WBC: 5.2 10*3/uL (ref 4.0–10.5)

## 2016-11-28 NOTE — Progress Notes (Signed)
Initial Nutrition Assessment  DOCUMENTATION CODES:  Severe malnutrition in context of acute illness/injury, Obesity unspecified  INTERVENTION:  Has essentially gone 14 days without adequate nutrition  Using ASPEN recommendations, At this time, placement of feeding tube and initiation of tube feeds is indicated for this patient. Small bore would be ideal, but would need to be placed in restraints. PEG with abd binder may be most appropriate.  NUTRITION DIAGNOSIS:  Malnutrition (Severe) related to lethargy/confusion, acute illness as evidenced by loss of >4% bw x 2 weeks and an estimated energy intake that has met < or equal to 50% of needs for > or equal to 5 days.  GOAL:  Patient will meet greater than or equal to 90% of their needs  MONITOR:  PO intake, Labs, Weight trends, PLacement of feeding tube  REASON FOR ASSESSMENT:  Low Braden    ASSESSMENT:  51 y/o female PMHx CKD5, COPD, HTN, OSA, DM, several admissions for AKI, Bipolar Disorder. Presented with malaise and visual hallucinations. Admitted for HTN urgency.   Per chart review, pt has been essentially hospitalized since 4/20. Since 4/27 has had ongoing hallucinations and other psych issues with have essentially eliminated her PO intake.  Per Meal documentation. The last day patient  Even ate close to 50% of her meals was 4/27. 19 meals have been documented since that time 15 of them have been 0% and the others </=50%.    During hospitalization 4/20-4/25 intake was better @ 25-75% of meal intake, but still not thought to meet needs. Has essentially gone 14 days without adequate nutrition  RD tried in vain to determine what was prohibiting her from eating, if there was anything RD could give her that she would eat and how she felt in general. All responses were incoherent or nonsensical. Her Lunch tray is untouched next to her.   Bed weight today is 255 lbs (115.9 kg). This reflects a loss of 11 lbs x2 weeks (>4% bw)  Using  ASPEN recommendations, At this time, due to significant wt loss and days w/o adequate nutrition, placement of feeding tube and initiation of tube feeds is indicated for this patient.   Physical exam: No overtly noticeable wasting, though in restraints with lights off and door closed to keep calm  Labs : BG 110-120, BUN/Creat: 43/3/73 Meds: Albumin 3.3, K: 3.4, Phos:2.2   Recent Labs Lab 11/24/16 1005 11/27/16 0529 11/28/16 0601  NA 145 147* 144  K 3.9 3.3* 3.4*  CL 112* 117* 113*  CO2 22 19* 23  BUN 51* 48* 43*  CREATININE 4.13* 3.80* 3.73*  CALCIUM 9.2 9.1 9.0  PHOS  --   --  2.2*  GLUCOSE 109* 109* 117*    Diet Order:  Diet Heart Room service appropriate? Yes; Fluid consistency: Thin  Skin: MSAD to vagina, perineum  Last BM:  5/4  Height:  Ht Readings from Last 1 Encounters:  11/20/16 _0  (1.727 m)   Weight:  Wt Readings from Last 1 Encounters:  11/20/16 258 lb 9.6 oz (117.3 kg)   Wt Readings from Last 10 Encounters:  11/20/16 258 lb 9.6 oz (117.3 kg)  11/15/16 266 lb (120.7 kg)  10/27/16 280 lb (127 kg)  10/23/16 280 lb (127 kg)  10/15/16 280 lb (127 kg)  10/01/16 288 lb (130.6 kg)  09/07/16 288 lb (130.6 kg)  09/05/16 288 lb (130.6 kg)  07/26/16 284 lb 6.4 oz (129 kg)  07/23/16 300 lb (136.1 kg)   Ideal Body Weight:  63.64  kg  BMI:  Body mass index is 39.32 kg/m.  Estimated Nutritional Needs:  Kcal:  1750-2000 (15-17 kal/kg bw) Protein:  70-81 g pro (.6-.7 g/kg bw) Fluid:  1.8-2 L fluid (1 ml/kcal)  EDUCATION NEEDS:  No education needs identified at this time  Burtis Junes RD, LDN, [facility] Pager: [phone] 11/28/2016 3:37 PM

## 2016-11-28 NOTE — Progress Notes (Signed)
Patient continues to be confused and pulling at CPAP mask. She refuses to understand that she needs to wear the mask and sleep. She is currently in upper 90s in her saturation on room air and is on telemetry. CPAP unit is at bedside and will continue to monitor her status.

## 2016-11-28 NOTE — Progress Notes (Signed)
LCSW following for disposition:  New placement  Spoke with Gerald Stabs, patient's fiance who reports he wants to go with [facility] for placement.  Call placed to admissions: [facility] to notify of family preference and ensure bed placement.  Plan is most likely for patient to transition to SNF early next week. Gerald Stabs reports he can be reach also on patient's phone:  [phone] if needs arise. He is encouraged to tour facility and given all contact information along with address to visit as he is concerned of care at facility due to a poor experience he had years ago.  LCSW was able to answer as many questions as possible for patient and asked him to call SNF for additional specific questions.  Will continue to follow and assist with discharge planning needs.  Lane Hacker, MSW Clinical Social Work: [employer] Coverage for :  [phone]

## 2016-11-28 NOTE — Progress Notes (Signed)
Subjective: She is more alert. She says she may try to eat today. Still very confused.  Objective: Vital signs in last 24 hours: Temp:  [97.8 F (36.6 C)] 97.8 F (36.6 C) (05/03 1300) Pulse Rate:  [67-76] 67 (05/03 1300) Resp:  [20] 20 (05/03 1300) BP: (130-195)/(70-96) 195/96 (05/04 0605) SpO2:  [93 %-97 %] 94 % (05/03 2016) Weight change:  Last BM Date: 11/26/16  Intake/Output from previous day: 05/03 0701 - 05/04 0700 In: 529.3 [P.O.:360; I.V.:169.3] Out: -   PHYSICAL EXAM General appearance: alert, cooperative and mild distress Resp: clear to auscultation bilaterally Cardio: regular rate and rhythm, S1, S2 normal, no murmur, click, rub or gallop GI: soft, non-tender; bowel sounds normal; no masses,  no organomegaly Extremities: extremities normal, atraumatic, no cyanosis or edema Skin warm and dry  Lab Results:  Results for orders placed or performed during the hospital encounter of 11/20/16 (from the past 48 hour(s))  Comprehensive metabolic panel     Status: Abnormal   Collection Time: 11/27/16  5:29 AM  Result Value Ref Range   Sodium 147 (H) 135 - 145 mmol/L   Potassium 3.3 (L) 3.5 - 5.1 mmol/L   Chloride 117 (H) 101 - 111 mmol/L   CO2 19 (L) 22 - 32 mmol/L   Glucose, Bld 109 (H) 65 - 99 mg/dL   BUN 48 (H) 6 - 20 mg/dL   Creatinine, Ser 3.80 (H) 0.44 - 1.00 mg/dL   Calcium 9.1 8.9 - 10.3 mg/dL   Total Protein 6.1 (L) 6.5 - 8.1 g/dL   Albumin 3.2 (L) 3.5 - 5.0 g/dL   AST 13 (L) 15 - 41 U/L   ALT 28 14 - 54 U/L   Alkaline Phosphatase 68 38 - 126 U/L   Total Bilirubin 0.7 0.3 - 1.2 mg/dL   GFR calc non Af Amer 13 (L) >60 mL/min   GFR calc Af Amer 15 (L) >60 mL/min    Comment: (NOTE) The eGFR has been calculated using the CKD EPI equation. This calculation has not been validated in all clinical situations. eGFR's persistently <60 mL/min signify possible Chronic Kidney Disease.    Anion gap 11 5 - 15  CBC with Differential/Platelet     Status: Abnormal   Collection Time: 11/27/16  5:29 AM  Result Value Ref Range   WBC 5.2 4.0 - 10.5 K/uL   RBC 3.79 (L) 3.87 - 5.11 MIL/uL   Hemoglobin 10.9 (L) 12.0 - 15.0 g/dL   HCT 33.0 (L) 36.0 - 46.0 %   MCV 87.1 78.0 - 100.0 fL   MCH 28.8 26.0 - 34.0 pg   MCHC 33.0 30.0 - 36.0 g/dL   RDW 15.3 11.5 - 15.5 %   Platelets 154 150 - 400 K/uL   Neutrophils Relative % 52 %   Neutro Abs 2.7 1.7 - 7.7 K/uL   Lymphocytes Relative 33 %   Lymphs Abs 1.7 0.7 - 4.0 K/uL   Monocytes Relative 11 %   Monocytes Absolute 0.6 0.1 - 1.0 K/uL   Eosinophils Relative 3 %   Eosinophils Absolute 0.1 0.0 - 0.7 K/uL   Basophils Relative 1 %   Basophils Absolute 0.0 0.0 - 0.1 K/uL  Glucose, capillary     Status: Abnormal   Collection Time: 11/27/16  1:19 PM  Result Value Ref Range   Glucose-Capillary 114 (H) 65 - 99 mg/dL  CBC     Status: Abnormal   Collection Time: 11/28/16  6:01 AM  Result Value Ref Range  WBC 5.2 4.0 - 10.5 K/uL   RBC 3.89 3.87 - 5.11 MIL/uL   Hemoglobin 11.1 (L) 12.0 - 15.0 g/dL   HCT 13.7 (L) 07.4 - 08.8 %   MCV 86.4 78.0 - 100.0 fL   MCH 28.5 26.0 - 34.0 pg   MCHC 33.0 30.0 - 36.0 g/dL   RDW 93.4 48.1 - 17.5 %   Platelets 155 150 - 400 K/uL  Renal function panel     Status: Abnormal   Collection Time: 11/28/16  6:01 AM  Result Value Ref Range   Sodium 144 135 - 145 mmol/L   Potassium 3.4 (L) 3.5 - 5.1 mmol/L   Chloride 113 (H) 101 - 111 mmol/L   CO2 23 22 - 32 mmol/L   Glucose, Bld 117 (H) 65 - 99 mg/dL   BUN 43 (H) 6 - 20 mg/dL   Creatinine, Ser 6.74 (H) 0.44 - 1.00 mg/dL   Calcium 9.0 8.9 - 80.5 mg/dL   Phosphorus 2.2 (L) 2.5 - 4.6 mg/dL   Albumin 3.3 (L) 3.5 - 5.0 g/dL   GFR calc non Af Amer 13 (L) >60 mL/min   GFR calc Af Amer 15 (L) >60 mL/min    Comment: (NOTE) The eGFR has been calculated using the CKD EPI equation. This calculation has not been validated in all clinical situations. eGFR's persistently <60 mL/min signify possible Chronic Kidney Disease.    Anion gap 8 5  - 15    ABGS No results for input(s): PHART, PO2ART, TCO2, HCO3 in the last 72 hours.  Invalid input(s): PCO2 CULTURES Recent Results (from the past 240 hour(s))  Urine culture     Status: Abnormal   Collection Time: 11/22/16 10:55 PM  Result Value Ref Range Status   Specimen Description URINE, CLEAN CATCH  Final   Special Requests NONE  Final   Culture MULTIPLE SPECIES PRESENT, SUGGEST RECOLLECTION (A)  Final   Report Status 11/24/2016 FINAL  Final   Studies/Results: No results found.  Medications:  Prior to Admission:  Prescriptions Prior to Admission  Medication Sig Dispense Refill Last Dose  . albuterol (PROVENTIL HFA;VENTOLIN HFA) 108 (90 Base) MCG/ACT inhaler Inhale 2 puffs into the lungs every 4 (four) hours as needed for wheezing or shortness of breath. 1 Inhaler 0 11/20/2016 at Unknown time  . albuterol (PROVENTIL) (2.5 MG/3ML) 0.083% nebulizer solution Inhale 3 mLs (2.5 mg total) into the lungs 3 (three) times daily. 75 mL 12 11/20/2016 at Unknown time  . ALPRAZolam (XANAX) 1 MG tablet Take 1 mg by mouth 4 (four) times daily.     11/20/2016 at Unknown time  . atorvastatin (LIPITOR) 20 MG tablet Take 20 mg by mouth daily. Take along with 10 mg tablet to equal 30 mg daily.   11/19/2016 at Unknown time  . baclofen (LIORESAL) 20 MG tablet Take 20 mg by mouth 3 (three) times daily as needed for muscle spasms.   11/19/2016 at Unknown time  . carvedilol (COREG) 12.5 MG tablet Take 1 tablet (12.5 mg total) by mouth 2 (two) times daily with a meal. 60 tablet 0 11/20/2016 at 1000  . colchicine 0.6 MG tablet Take 1 tablet (0.6 mg total) by mouth every other day. 16 tablet 12   . estradiol (ESTRACE) 0.5 MG tablet Take 0.5 mg by mouth daily.     . fluticasone (FLONASE) 50 MCG/ACT nasal spray Place 2 sprays into both nostrils at bedtime.   11/19/2016 at Unknown time  . hydrALAZINE (APRESOLINE) 25 MG tablet Take  1 tablet (25 mg total) by mouth every 8 (eight) hours. 90 tablet 12 11/20/2016 at  Unknown time  . HYDROcodone-acetaminophen (NORCO) 10-325 MG tablet Take 1 tablet by mouth every 4 (four) hours as needed for moderate pain.   11/20/2016 at Unknown time  . lisinopril-hydrochlorothiazide (PRINZIDE,ZESTORETIC) 20-12.5 MG tablet Take 1 tablet by mouth daily.   11/20/2016 at Unknown time  . loperamide (IMODIUM) 2 MG capsule Take 1 capsule (2 mg total) by mouth 4 (four) times daily as needed for diarrhea or loose stools. 12 capsule 0 11/20/2016 at Unknown time  . metoCLOPramide (REGLAN) 10 MG tablet Take 1 tablet (10 mg total) by mouth every 6 (six) hours as needed for nausea (or headache). 30 tablet 0 11/20/2016 at Unknown time  . PARoxetine (PAXIL) 20 MG tablet Take 1 daily. This is to prevent panic attacks (Patient taking differently: Take 40 mg by mouth daily. Take 1 daily. This is to prevent panic attacks) 30 tablet 12 11/19/2016 at Unknown time  . polyethylene glycol (MIRALAX / GLYCOLAX) packet Take 17 g by mouth daily. 14 each 0 Past Week at Unknown time  . torsemide (DEMADEX) 20 MG tablet Take 2 tablets (40 mg total) by mouth daily. 60 tablet 12 11/19/2016 at Unknown time  . atorvastatin (LIPITOR) 10 MG tablet Take 10 mg by mouth daily. Take along with 20 mg tablet to equal 30 mg daily.   Not Taking at Unknown time  . rOPINIRole (REQUIP) 1 MG tablet Take 1 mg by mouth at bedtime.    Not Taking at Unknown time   Scheduled: . albuterol  2.5 mg Inhalation TID  . ALPRAZolam  0.25 mg Oral TID  . atorvastatin  10 mg Oral Daily  . atorvastatin  20 mg Oral Daily  . carvedilol  12.5 mg Oral BID WC  . chlorhexidine  15 mL Mouth Rinse BID  . colchicine  0.6 mg Oral Q48H  . dronabinol  2.5 mg Oral BID AC  . estradiol  0.5 mg Oral Daily  . fluticasone  2 spray Each Nare QHS  . heparin  5,000 Units Subcutaneous Q8H  . hydrALAZINE  25 mg Oral Q8H  . lisinopril  20 mg Oral Daily   And  . hydrochlorothiazide  12.5 mg Oral Daily  . LORazepam  0.5 mg Intravenous QHS  . mouth rinse  15 mL Mouth  Rinse q12n4p  . PARoxetine  20 mg Oral Daily  . polyethylene glycol  17 g Oral Daily  . potassium chloride  20 mEq Oral BID  . risperiDONE  0.5 mg Oral BID  . sodium chloride flush  3 mL Intravenous Q12H  . torsemide  40 mg Oral Daily   Continuous: . sodium chloride 75 mL/hr at 11/28/16 0200   PPJ:KDTOIZTIW, labetalol, loperamide, metoCLOPramide  Assesment:She was admitted with malignant hypertension. She still has some trouble with her blood pressure but better in general. She has been hallucinating and clearly has encephalopathy. At baseline she has bipolar 1 disorder. She has been having trouble with anxiety and depression and panic attacks. She has renal failure. She is not eating. I have added medications. I think her problem is a combination of multiple factors with a significant psychiatric component. She is improving but very slowly Principal Problem:   HTN (hypertension), malignant Active Problems:   Diabetes mellitus   Bipolar 1 disorder ([place])   Noncompliance with medication regimen   Panic attack   Confusion    Plan: Discussed with nephrology. We both feel  that if we send her even to a skilled care facility now she would likely be back in the hospital very quickly. I don't think she is quite ready but maybe in the next 48-72 hours. I'd like to see if she can eat. Her mental status is clearing but very slowly.    LOS: 6 days   Tima Curet L 11/28/2016, 9:05 AM

## 2016-11-28 NOTE — Progress Notes (Signed)
Subjective: Interval History: . Patient is alert but remained confused.  Objective: Vital signs in last 24 hours: Temp:  [97.8 F (36.6 C)] 97.8 F (36.6 C) (05/03 1300) Pulse Rate:  [67-76] 67 (05/03 1300) Resp:  [20] 20 (05/03 1300) BP: (130-195)/(70-96) 195/96 (05/04 0605) SpO2:  [93 %-97 %] 94 % (05/03 2016) Weight change:   Intake/Output from previous day: 05/03 0701 - 05/04 0700 In: 529.3 [P.O.:360; I.V.:169.3] Out: -  Intake/Output this shift: No intake/output data recorded.  Generally: Patient confused and restless. He tried to answer questions but not sure whether she comprehends Chest is clear to auscultation Heart exam regular rate and rhythm Extremities no edema  Lab Results:  Recent Labs  11/27/16 0529 11/28/16 0601  WBC 5.2 5.2  HGB 10.9* 11.1*  HCT 33.0* 33.6*  PLT 154 155   BMET:   Recent Labs  11/27/16 0529 11/28/16 0601  NA 147* 144  K 3.3* 3.4*  CL 117* 113*  CO2 19* 23  GLUCOSE 109* 117*  BUN 48* 43*  CREATININE 3.80* 3.73*  CALCIUM 9.1 9.0   No results for input(s): PTH in the last 72 hours. Iron Studies: No results for input(s): IRON, TIBC, TRANSFERRIN, FERRITIN in the last 72 hours.  Studies/Results: No results found.  I have reviewed the patient's current medications.  Assessment/Plan: Problem #1 acute kidney injury superimposed on chronic. Her renal function continued to improve. Presently returning to her baseline. Problem #2 chronic renal failure: Stage V Problem #3 hallucination: Patient at this moment seems to be somewhat better, restless, but answer questions. Problem #4 hypokalemia: Potassium is low. Her potassium however is improving. Problem #5 hypernatremia: Possibly from lack of free water. Patient on half normal saline her sodium is 144 and has improved. Problem #6 anemia: Her hemoglobin and hematocrit stable   Problem #7 history of bipolar disorder Problem #8 bone mineral disorder: Her calcium is range but her  phosphorus is slightly low. Overall seems to be stable Plan: 1]We will continue his present management 2] we'll check her renal panel and CBC in the morning.    LOS: 6 days   Armanii Urbanik S 11/28/2016,7:39 AM

## 2016-11-29 DIAGNOSIS — E43 Unspecified severe protein-calorie malnutrition: Secondary | ICD-10-CM | POA: Insufficient documentation

## 2016-11-29 NOTE — Progress Notes (Signed)
Subjective:  Patient remained confused and disoriented. She is refusing care and treatment.  Objective: Vital signs in last 24 hours: Temp:  [98.7 F (37.1 C)-99.1 F (37.3 C)] 98.7 F (37.1 C) (05/05 0605) Pulse Rate:  [67-75] 67 (05/05 0605) Resp:  [18-20] 18 (05/05 0605) BP: (128-137)/(66-78) 130/78 (05/05 0605) SpO2:  [94 %-100 %] 97 % (05/05 0748) Weight change:  Last BM Date: 11/28/16  Intake/Output from previous day: 05/04 0701 - 05/05 0700 In: 3 [I.V.:3] Out: -   PHYSICAL EXAM General appearance: delirious and moderately obese Resp: clear to auscultation bilaterally Cardio: S1, S2 normal GI: soft, non-tender; bowel sounds normal; no masses,  no organomegaly Extremities: extremities normal, atraumatic, no cyanosis or edema  Lab Results:  Results for orders placed or performed during the hospital encounter of 11/20/16 (from the past 48 hour(s))  Glucose, capillary     Status: Abnormal   Collection Time: 11/27/16  1:19 PM  Result Value Ref Range   Glucose-Capillary 114 (H) 65 - 99 mg/dL  CBC     Status: Abnormal   Collection Time: 11/28/16  6:01 AM  Result Value Ref Range   WBC 5.2 4.0 - 10.5 K/uL   RBC 3.89 3.87 - 5.11 MIL/uL   Hemoglobin 11.1 (L) 12.0 - 15.0 g/dL   HCT 33.6 (L) 36.0 - 46.0 %   MCV 86.4 78.0 - 100.0 fL   MCH 28.5 26.0 - 34.0 pg   MCHC 33.0 30.0 - 36.0 g/dL   RDW 15.1 11.5 - 15.5 %   Platelets 155 150 - 400 K/uL  Renal function panel     Status: Abnormal   Collection Time: 11/28/16  6:01 AM  Result Value Ref Range   Sodium 144 135 - 145 mmol/L   Potassium 3.4 (L) 3.5 - 5.1 mmol/L   Chloride 113 (H) 101 - 111 mmol/L   CO2 23 22 - 32 mmol/L   Glucose, Bld 117 (H) 65 - 99 mg/dL   BUN 43 (H) 6 - 20 mg/dL   Creatinine, Ser 3.73 (H) 0.44 - 1.00 mg/dL   Calcium 9.0 8.9 - 10.3 mg/dL   Phosphorus 2.2 (L) 2.5 - 4.6 mg/dL   Albumin 3.3 (L) 3.5 - 5.0 g/dL   GFR calc non Af Amer 13 (L) >60 mL/min   GFR calc Af Amer 15 (L) >60 mL/min    Comment:  (NOTE) The eGFR has been calculated using the CKD EPI equation. This calculation has not been validated in all clinical situations. eGFR's persistently <60 mL/min signify possible Chronic Kidney Disease.    Anion gap 8 5 - 15    ABGS No results for input(s): PHART, PO2ART, TCO2, HCO3 in the last 72 hours.  Invalid input(s): PCO2 CULTURES Recent Results (from the past 240 hour(s))  Urine culture     Status: Abnormal   Collection Time: 11/22/16 10:55 PM  Result Value Ref Range Status   Specimen Description URINE, CLEAN CATCH  Final   Special Requests NONE  Final   Culture MULTIPLE SPECIES PRESENT, SUGGEST RECOLLECTION (A)  Final   Report Status 11/24/2016 FINAL  Final   Studies/Results: No results found.  Medications: I have reviewed the patient's current medications.  Assesment:  Principal Problem:   HTN (hypertension), malignant Active Problems:   Diabetes mellitus   Bipolar 1 disorder ([place])   Noncompliance with medication regimen   Panic attack   Confusion   Protein-calorie malnutrition, severe    Plan:  medications reviewed Continue current treatment as per  nephrology recommendation Continue supportive care    LOS: 7 days   Sabriah Hobbins 11/29/2016, 8:13 AM

## 2016-11-29 NOTE — Progress Notes (Signed)
heSubjective: Interval History: . Patient remains restless. She is alert.   Objective: Vital signs  24 hours: Temp:  [98.7 F (37.1 C)-99.1 F (37.3 C)] 98.7 F (37.1 C) (05/05 0605) Pulse Rate:  [67-75] 67 (05/05 0605) Resp:  [18-20] 18 (05/05 0605) BP: (128-137)/(66-78) 130/78 (05/05 0605) SpO2:  [94 %-100 %] 97 % (05/05 0748) Weight change:   Intake/Output from previous day: 05/04 0701 - 05/05 0700 In: 2265.5 [I.V.:2265.5] Out: -  Intake/Output this shift: No intake/output data recorded.  Generally: Patient remains confused and restless. She is trying to grab her bed cover.  Chest is clear to auscultation Heart exam regular rate and rhythm Extremities no edema  Lab Results:  Recent Labs  11/27/16 0529 11/28/16 0601  WBC 5.2 5.2  HGB 10.9* 11.1*  HCT 33.0* 33.6*  PLT 154 155   BMET:   Recent Labs  11/27/16 0529 11/28/16 0601  NA 147* 144  K 3.3* 3.4*  CL 117* 113*  CO2 19* 23  GLUCOSE 109* 117*  BUN 48* 43*  CREATININE 3.80* 3.73*  CALCIUM 9.1 9.0   No results for input(s): PTH in the last 72 hours. Iron Studies: No results for input(s): IRON, TIBC, TRANSFERRIN, FERRITIN in the last 72 hours.  Studies/Results: No results found.  I have reviewed the patient's current medications.  Assessment/Plan: Problem #1 acute kidney injury superimposed on chronic. Her renal function Has returned to her baseline.  Problem #2 chronic renal failure: Stage V Problem #3 hallucination: state the above patient remains restless and moving around trying to grab things.  Problem #4 hypokalemia: Potassium is low. Her potassium however is improving. Problem #5 hypernatremia: her sodium has corrected.  Problem #6 anemia: Her hemoglobin and hematocrit stable   Problem #7 history of bipolar disorder Problem #8 bone mineral disorder: Her calcium is range but her phosphorus is slightly low but stable.  Plan: 1]We will continue his present management 2] we'll check her  renal panel and CBC in the morning.    LOS: 7 days   Colin Ellers S 11/29/2016,8:49 AM

## 2016-11-29 NOTE — Progress Notes (Signed)
Physical Therapy Treatment Patient Details Name: Martha [name] MRN: [id number] DOB: 07-21-1966 Today's Date: 11/29/2016    History of Present Illness 51 y.o.femalewith medical history significant of CKD stage 5, COPD, HTN, DM, OSA, just discharged yesterday after several admission for AKI, given IVF with improved, returned to the ER with complaints of not feeling well, having visual hallucination intermittently.  Evaluation in the ER showed BP 210/115, and she was started on IV Cardene, with subsequent BP of 536 systolic.  Work up included creatinine at baseline, K ok, and no leukocytosis.  Hospitalist was asked to admit her for HTN urgency.  She has significant hx of psychiatric diagnosis as well, and TTS was consulted.  Dx: Unexplained encephalopathy but the picture seems most consistent with the toxic metabolic processes most likely from worsening renal failure.     PT Comments    Pt flailing arms when therapist entered the room.  Pt followed therapist commands less than 50% during treatment time.  Pt needed max assist for rolling.  Did not attempt sit to stand as this will need two person assist for safety.    Follow Up Recommendations  SNF;Supervision/Assistance - 24 hour     Equipment Recommendations    none   Recommendations for Other Services   OT/SP     Precautions / Restrictions Precautions Precautions: Fall (B safety mitts) Precaution Comments: 2 falls - Oct 29 was walking into the house and she stumbled, The other time she was goign to the bathroom, and tripped on her flip flop bedroom shoes. 1 additional fall here in the hospital last admission.  Restrictions Weight Bearing Restrictions: No    Mobility  Bed Mobility Overal bed mobility: Needs Assistance (Not tested due to safety)   Rolling: Max assist   Supine to sit:  (Not tested due to safety)              Cognition Arousal/Alertness: Awake/alert Behavior During Therapy: Impulsive;Restless Overall  Cognitive Status: Impaired/Different from baseline Area of Impairment: Orientation;Safety/judgement;Awareness;Problem solving;Memory;Attention;Following commands                 Orientation Level: Place;Disoriented to;Time             General Comments: Pt very difficult to understand stand.  When asked if she knows her name she is able to state but does not know where she is, time or situation       Exercises General Exercises - Upper Extremity Shoulder Flexion: AAROM;Both;5 reps Elbow Flexion: Both;5 reps General Exercises - Lower Extremity Heel Slides: Both;5 reps;AAROM Hip ABduction/ADduction: Both;5 reps;AAROM Straight Leg Raises: Both;5 reps;AAROM    General Comments        Pertinent Vitals/Pain Pain Assessment: Faces Faces Pain Scale: Hurts a little bit Pain Location: knees with motion    Home Living                          PT Goals (current goals can now be found in the care plan section) Acute Rehab PT Goals Patient Stated Goal: None stated PT Goal Formulation: With family Time For Goal Achievement: 12/09/16 Potential to Achieve Goals: Poor Progress towards PT goals: Progressing toward goals    Frequency    Min 6X/week      PT Plan Current plan remains appropriate    Co-evaluation              AM-PAC PT "6 Clicks" Daily Activity  Outcome Measure  Difficulty turning  over in bed (including adjusting bedclothes, sheets and blankets)?: A Lot Difficulty moving from lying on back to sitting on the side of the bed? : A Lot Difficulty sitting down on and standing up from a chair with arms (e.g., wheelchair, bedside commode, etc,.)?: Total Help needed moving to and from a bed to chair (including a wheelchair)?: Total Help needed walking in hospital room?: Total Help needed climbing 3-5 steps with a railing? : Total 6 Click Score: 8    End of Session   Activity Tolerance: Other (comment) (mental status) Patient left: in bed;with  family/visitor present   PT Visit Diagnosis: Muscle weakness (generalized) (M62.81);Other abnormalities of gait and mobility (R26.89);History of falling (Z91.81);Other symptoms and signs involving the nervous system (F38.329)     Time: 1916-6060 PT Time Calculation (min) (ACUTE ONLY): 32 min  Charges:  $Therapeutic Exercise: 23-37 mins           Rayetta Humphrey, PT CLT [phone]   11/29/2016, 1:43 PM

## 2016-11-30 LAB — RENAL FUNCTION PANEL
ALBUMIN: 2.9 g/dL — AB (ref 3.5–5.0)
ANION GAP: 7 (ref 5–15)
BUN: 41 mg/dL — ABNORMAL HIGH (ref 6–20)
CHLORIDE: 112 mmol/L — AB (ref 101–111)
CO2: 22 mmol/L (ref 22–32)
Calcium: 8.7 mg/dL — ABNORMAL LOW (ref 8.9–10.3)
Creatinine, Ser: 4.24 mg/dL — ABNORMAL HIGH (ref 0.44–1.00)
GFR calc Af Amer: 13 mL/min — ABNORMAL LOW (ref >60)
GFR calc non Af Amer: 11 mL/min — ABNORMAL LOW (ref >60)
GLUCOSE: 116 mg/dL — AB (ref 65–99)
PHOSPHORUS: 3.2 mg/dL (ref 2.5–4.6)
POTASSIUM: 3.4 mmol/L — AB (ref 3.5–5.1)
Sodium: 141 mmol/L (ref 135–145)

## 2016-11-30 LAB — CBC
HEMATOCRIT: 32.5 % — AB (ref 36.0–46.0)
HEMOGLOBIN: 10.7 g/dL — AB (ref 12.0–15.0)
MCH: 28.5 pg (ref 26.0–34.0)
MCHC: 32.9 g/dL (ref 30.0–36.0)
MCV: 86.7 fL (ref 78.0–100.0)
Platelets: 148 10*3/uL — ABNORMAL LOW (ref 150–400)
RBC: 3.75 MIL/uL — ABNORMAL LOW (ref 3.87–5.11)
RDW: 15 % (ref 11.5–15.5)
WBC: 5.6 10*3/uL (ref 4.0–10.5)

## 2016-11-30 NOTE — Progress Notes (Addendum)
heSubjective: Interval History: . Patient is much better today. Presently she is eating her breakfast. She offers no complaints. She denies any difficulty breathing.  Objective: Vital signs  24 hours: Temp:  [97.5 F (36.4 C)-98.6 F (37 C)] 97.5 F (36.4 C) (05/06 0640) Pulse Rate:  [67-85] 67 (05/06 0640) Resp:  [18] 18 (05/05 2105) BP: (108-144)/(66-76) 108/66 (05/06 0640) SpO2:  [95 %-100 %] 99 % (05/06 0809) Weight change:   Intake/Output from previous day: 05/05 0701 - 05/06 0700 In: 2761.3 [P.O.:360; I.V.:2401.3] Out: -  Intake/Output this shift: No intake/output data recorded.  Generally: Patient is alert. Today she seems to be oriented to person and place. Chest is clear to auscultation Heart exam regular rate and rhythm Extremities no edema  Lab Results:  Recent Labs  11/28/16 0601 11/30/16 0550  WBC 5.2 5.6  HGB 11.1* 10.7*  HCT 33.6* 32.5*  PLT 155 148*   BMET:   Recent Labs  11/28/16 0601 11/30/16 0550  NA 144 141  K 3.4* 3.4*  CL 113* 112*  CO2 23 22  GLUCOSE 117* 116*  BUN 43* 41*  CREATININE 3.73* 4.24*  CALCIUM 9.0 8.7*   No results for input(s): PTH in the last 72 hours. Iron Studies: No results for input(s): IRON, TIBC, TRANSFERRIN, FERRITIN in the last 72 hours.  Studies/Results: No results found.  I have reviewed the patient's current medications.  Assessment/Plan: Problem #1 acute kidney injury superimposed on chronic. Her creatinine seems to be increasing today. Patient however doesn't have any uremic signs symptoms. Problem #2 chronic renal failure: Stage V Problem #3 hallucination: Is better Problem #4 hypokalemia: Potassium is low. Her potassium however is stable Problem #5 hypernatremia: her sodium has corrected.  Problem #6 anemia: Her hemoglobin and hematocrit stable   Problem #7 history of bipolar disorder Problem #8 bone mineral disorder: Her calcium And phosphorus is still range. Plan: 1]We will increase IV fluids  to 100 mL per hour. 2] we'll check her renal panel and CBC in the morning.    LOS: 8 days   Tailyn Hantz S 11/30/2016,9:28 AM

## 2016-11-30 NOTE — Progress Notes (Signed)
Subjective:  Patient is more alert and awake today. She is responding to verbal communications..  Objective: Vital signs in last 24 hours: Temp:  [97.5 F (36.4 C)-98.6 F (37 C)] 97.5 F (36.4 C) (05/06 0640) Pulse Rate:  [67-85] 67 (05/06 0640) Resp:  [18] 18 (05/05 2105) BP: (108-144)/(66-76) 108/66 (05/06 0640) SpO2:  [95 %-100 %] 99 % (05/06 0809) Weight change:  Last BM Date: 11/28/16  Intake/Output from previous day: 05/05 0701 - 05/06 0700 In: 2761.3 [P.O.:360; I.V.:2401.3] Out: -   PHYSICAL EXAM General appearance: delirious and moderately obese Resp: clear to auscultation bilaterally Cardio: S1, S2 normal GI: soft, non-tender; bowel sounds normal; no masses,  no organomegaly Extremities: extremities normal, atraumatic, no cyanosis or edema  Lab Results:  Results for orders placed or performed during the hospital encounter of 11/20/16 (from the past 48 hour(s))  CBC     Status: Abnormal   Collection Time: 11/30/16  5:50 AM  Result Value Ref Range   WBC 5.6 4.0 - 10.5 K/uL   RBC 3.75 (L) 3.87 - 5.11 MIL/uL   Hemoglobin 10.7 (L) 12.0 - 15.0 g/dL   HCT 32.5 (L) 36.0 - 46.0 %   MCV 86.7 78.0 - 100.0 fL   MCH 28.5 26.0 - 34.0 pg   MCHC 32.9 30.0 - 36.0 g/dL   RDW 15.0 11.5 - 15.5 %   Platelets 148 (L) 150 - 400 K/uL  Renal function panel     Status: Abnormal   Collection Time: 11/30/16  5:50 AM  Result Value Ref Range   Sodium 141 135 - 145 mmol/L   Potassium 3.4 (L) 3.5 - 5.1 mmol/L   Chloride 112 (H) 101 - 111 mmol/L   CO2 22 22 - 32 mmol/L   Glucose, Bld 116 (H) 65 - 99 mg/dL   BUN 41 (H) 6 - 20 mg/dL   Creatinine, Ser 4.24 (H) 0.44 - 1.00 mg/dL   Calcium 8.7 (L) 8.9 - 10.3 mg/dL   Phosphorus 3.2 2.5 - 4.6 mg/dL   Albumin 2.9 (L) 3.5 - 5.0 g/dL   GFR calc non Af Amer 11 (L) >60 mL/min   GFR calc Af Amer 13 (L) >60 mL/min    Comment: (NOTE) The eGFR has been calculated using the CKD EPI equation. This calculation has not been validated in all  clinical situations. eGFR's persistently <60 mL/min signify possible Chronic Kidney Disease.    Anion gap 7 5 - 15    ABGS No results for input(s): PHART, PO2ART, TCO2, HCO3 in the last 72 hours.  Invalid input(s): PCO2 CULTURES Recent Results (from the past 240 hour(s))  Urine culture     Status: Abnormal   Collection Time: 11/22/16 10:55 PM  Result Value Ref Range Status   Specimen Description URINE, CLEAN CATCH  Final   Special Requests NONE  Final   Culture MULTIPLE SPECIES PRESENT, SUGGEST RECOLLECTION (A)  Final   Report Status 11/24/2016 FINAL  Final   Studies/Results: No results found.  Medications: I have reviewed the patient's current medications.  Assesment:  Principal Problem:   HTN (hypertension), malignant Active Problems:   Diabetes mellitus   Bipolar 1 disorder ([place])   Noncompliance with medication regimen   Panic attack   Confusion   Protein-calorie malnutrition, severe    Plan:  medications reviewed Continue current treatment as per nephrology recommendation Continue supportive care    LOS: 8 days   Damonique Brunelle 11/30/2016, 10:01 AM

## 2016-11-30 NOTE — Progress Notes (Signed)
Patient much more alert and oriented this morning.  Answers questions correctly.  Able to remove mitts and patient feed herself.  OOB to [facility] and chair.  Patient remains very unsteady and weak still.  Requires 2 assist to help steady when up to transfer.  Patient now resting in chair, chair alarm on, non-skid socks in place, and call bell within reach.

## 2016-12-01 DIAGNOSIS — F23 Brief psychotic disorder: Secondary | ICD-10-CM | POA: Diagnosis present

## 2016-12-01 LAB — RENAL FUNCTION PANEL
ALBUMIN: 3 g/dL — AB (ref 3.5–5.0)
ANION GAP: 9 (ref 5–15)
BUN: 46 mg/dL — AB (ref 6–20)
CO2: 20 mmol/L — ABNORMAL LOW (ref 22–32)
Calcium: 8.8 mg/dL — ABNORMAL LOW (ref 8.9–10.3)
Chloride: 111 mmol/L (ref 101–111)
Creatinine, Ser: 4.2 mg/dL — ABNORMAL HIGH (ref 0.44–1.00)
GFR calc Af Amer: 13 mL/min — ABNORMAL LOW (ref >60)
GFR, EST NON AFRICAN AMERICAN: 12 mL/min — AB (ref >60)
Glucose, Bld: 130 mg/dL — ABNORMAL HIGH (ref 65–99)
PHOSPHORUS: 2.3 mg/dL — AB (ref 2.5–4.6)
POTASSIUM: 3.7 mmol/L (ref 3.5–5.1)
Sodium: 140 mmol/L (ref 135–145)

## 2016-12-01 LAB — CBC
HEMATOCRIT: 31.5 % — AB (ref 36.0–46.0)
HEMOGLOBIN: 10.4 g/dL — AB (ref 12.0–15.0)
MCH: 28.3 pg (ref 26.0–34.0)
MCHC: 33 g/dL (ref 30.0–36.0)
MCV: 85.6 fL (ref 78.0–100.0)
Platelets: 147 10*3/uL — ABNORMAL LOW (ref 150–400)
RBC: 3.68 MIL/uL — ABNORMAL LOW (ref 3.87–5.11)
RDW: 14.9 % (ref 11.5–15.5)
WBC: 5.5 10*3/uL (ref 4.0–10.5)

## 2016-12-01 MED ORDER — TORSEMIDE 20 MG PO TABS: 40.0000 mg | ORAL_TABLET | Freq: Every day | ORAL | Status: DC

## 2016-12-01 MED ORDER — RISPERIDONE 0.5 MG PO TABS: 0.5000 mg | ORAL_TABLET | Freq: Two times a day (BID) | ORAL | Status: DC

## 2016-12-01 MED ORDER — TORSEMIDE 20 MG PO TABS: 20.0000 mg | ORAL_TABLET | Freq: Every day | ORAL | Status: DC

## 2016-12-01 MED ORDER — ALPRAZOLAM 0.25 MG PO TABS: 0.2500 mg | ORAL_TABLET | Freq: Three times a day (TID) | ORAL | 0 refills | Status: DC

## 2016-12-01 MED ORDER — POTASSIUM CHLORIDE CRYS ER 20 MEQ PO TBCR: 20.0000 meq | EXTENDED_RELEASE_TABLET | Freq: Two times a day (BID) | ORAL | Status: DC

## 2016-12-01 NOTE — Progress Notes (Signed)
LCSW following for disposition:  New Placement  Patient medically stable for discharge. Patient's fiance notified of discharge and confirmed bed choice at [facility]. Patient will be transferred by EMS after lunch. All DC paperwork sent to facility and facility notified of admission.  RN aware of DC:  Report: [phone]  No other needs.  DC today.  Lane Hacker, MSW Clinical Social Work: [employer] Coverage for :  [phone]

## 2016-12-01 NOTE — Progress Notes (Addendum)
IV removed and tele removed, tolerated well. [employer] called with report, Discharge instructions given to patient and family at bedside. Transport via EMS.

## 2016-12-01 NOTE — Clinical Social Work Placement (Signed)
   CLINICAL SOCIAL WORK PLACEMENT  NOTE  Date:  12/01/2016  Patient Details  Name: Martha [name] MRN: [id number] Date of Birth: 27-Feb-1966  Clinical Social Work is seeking post-discharge placement for this patient at the [facility] level of care (*CSW will initial, date and re-position this form in  chart as items are completed):  Yes   Patient/family provided with [facility] Work Department's list of facilities offering this level of care within the geographic area requested by the patient (or if unable, by the patient's family).  Yes   Patient/family informed of their freedom to choose among providers that offer the needed level of care, that participate in Medicare, Medicaid or managed care program needed by the patient, have an available bed and are willing to accept the patient.  Yes   Patient/family informed of [facility]'s ownership interest in [facility] and [facility], as well as of the fact that they are under no obligation to receive care at these facilities.  PASRR submitted to EDS on 11/25/16     PASRR number received on       Existing PASRR number confirmed on       FL2 transmitted to all facilities in geographic area requested by pt/family on 11/27/16     FL2 transmitted to all facilities within larger geographic area on       Patient informed that his/her managed care company has contracts with or will negotiate with certain facilities, including the following:        Yes   Patient/family informed of bed offers received.  Patient chooses bed at [facility]     Physician recommends and patient chooses bed at [facility]    Patient to be transferred to [facility] on 12/01/16.  Patient to be transferred to facility by EMS     Patient family notified on 12/01/16 of transfer.  Name of family member notified:  Fiance: Chris     PHYSICIAN Please sign FL2     Additional Comment:     _______________________________________________ Lilly Cove, LCSW 12/01/2016, 10:12 AM

## 2016-12-01 NOTE — Discharge Summary (Addendum)
Physician Discharge Summary  Patient ID: Martha [name] MRN: [id number] DOB/AGE: [id number] y.o. Primary Care Physician:Zylan Almquist, Percell Miller, MD Admit date: 11/20/2016 Discharge date: 12/01/2016    Discharge Diagnoses:   Principal Problem:   HTN (hypertension), malignant Active Problems:   CKD (chronic kidney disease) stage 5, GFR less than 15 ml/min (HCC)   Diabetes mellitus   Bipolar 1 disorder ([place])   Noncompliance with medication regimen   Panic attack   Confusion   Protein-calorie malnutrition, severe   Acute psychosis Anemia of chronic renal failure  Allergies as of 12/01/2016      Reactions   Codeine Nausea And Vomiting      Medication List    STOP taking these medications   baclofen 20 MG tablet Commonly known as:  LIORESAL   estradiol 0.5 MG tablet Commonly known as:  ESTRACE   HYDROcodone-acetaminophen 10-325 MG tablet Commonly known as:  NORCO   metoCLOPramide 10 MG tablet Commonly known as:  REGLAN     TAKE these medications   albuterol 108 (90 Base) MCG/ACT inhaler Commonly known as:  PROVENTIL HFA;VENTOLIN HFA Inhale 2 puffs into the lungs every 4 (four) hours as needed for wheezing or shortness of breath.   albuterol (2.5 MG/3ML) 0.083% nebulizer solution Commonly known as:  PROVENTIL Inhale 3 mLs (2.5 mg total) into the lungs 3 (three) times daily.   ALPRAZolam 0.25 MG tablet Commonly known as:  XANAX Take 1 tablet (0.25 mg total) by mouth 3 (three) times daily. What changed:  medication strength  how much to take  when to take this   atorvastatin 10 MG tablet Commonly known as:  LIPITOR Take 10 mg by mouth daily. Take along with 20 mg tablet to equal 30 mg daily. What changed:  Another medication with the same name was removed. Continue taking this medication, and follow the directions you see here.   carvedilol 12.5 MG tablet Commonly known as:  COREG Take 1 tablet (12.5 mg total) by mouth 2 (two) times daily with a meal.    colchicine 0.6 MG tablet Take 1 tablet (0.6 mg total) by mouth every other day.   fluticasone 50 MCG/ACT nasal spray Commonly known as:  FLONASE Place 2 sprays into both nostrils at bedtime.   hydrALAZINE 25 MG tablet Commonly known as:  APRESOLINE Take 1 tablet (25 mg total) by mouth every 8 (eight) hours.   lisinopril-hydrochlorothiazide 20-12.5 MG tablet Commonly known as:  PRINZIDE,ZESTORETIC Take 1 tablet by mouth daily.   loperamide 2 MG capsule Commonly known as:  IMODIUM Take 1 capsule (2 mg total) by mouth 4 (four) times daily as needed for diarrhea or loose stools.   PARoxetine 20 MG tablet Commonly known as:  PAXIL Take 1 daily. This is to prevent panic attacks What changed:  how much to take  how to take this  when to take this  additional instructions   polyethylene glycol packet Commonly known as:  MIRALAX / GLYCOLAX Take 17 g by mouth daily.   potassium chloride SA 20 MEQ tablet Commonly known as:  K-DUR,KLOR-CON Take 1 tablet (20 mEq total) by mouth 2 (two) times daily.   risperiDONE 0.5 MG tablet Commonly known as:  RISPERDAL Take 1 tablet (0.5 mg total) by mouth 2 (two) times daily.   rOPINIRole 1 MG tablet Commonly known as:  REQUIP Take 1 mg by mouth at bedtime.   torsemide 20 MG tablet Commonly known as:  DEMADEX Take 1 tablet (20 mg total) by mouth daily.  What changed:  how much to take       Discharged Condition:Improved    Consults: Nephrology, neurology, psychiatry  Significant Diagnostic Studies: Ct Head Wo Contrast  Result Date: 11/22/2016 CLINICAL DATA:  Confusion with hallucinations EXAM: CT HEAD WITHOUT CONTRAST TECHNIQUE: Contiguous axial images were obtained from the base of the skull through the vertex without intravenous contrast. COMPARISON:  November 15, 2016 FINDINGS: Brain: The ventricles are normal in size and configuration. There is no intracranial mass, hemorrhage, extra-axial fluid collection, or midline shift.  The gray-white compartments are normal. No acute infarct evident. Vascular: No hyperdense vessel. There is calcification in the left carotid siphon region. Skull: Bony calvarium appears intact. Sinuses/Orbits: Visualized paranasal sinuses are clear. Orbits appear symmetric bilaterally. Other: Visualized mastoid air cells are clear. IMPRESSION: Slight calcification in the left carotid siphon. No intracranial mass, hemorrhage, or extra-axial fluid collection. Gray-white compartments are normal. Electronically Signed   By: Lowella Grip III M.D.   On: 11/22/2016 14:38   Ct Head Wo Contrast  Result Date: 11/15/2016 CLINICAL DATA:  Two day history of headache. Patient woke from sleep with bilateral arm and leg weakness. EXAM: CT HEAD WITHOUT CONTRAST TECHNIQUE: Contiguous axial images were obtained from the base of the skull through the vertex without intravenous contrast. COMPARISON:  10/27/2016 FINDINGS: Brain: No evidence of acute infarction, hemorrhage, hydrocephalus, extra-axial collection or mass lesion/mass effect. Vascular: No hyperdense vessel or unexpected calcification. Skull: Normal. Negative for fracture or focal lesion. Sinuses/Orbits: Probable old medial wall fracture of the right orbit. Paranasal sinuses and mastoid air cells are clear. Other: No change since prior study. IMPRESSION: No acute intracranial abnormalities. Electronically Signed   By: Lucienne Capers M.D.   On: 11/15/2016 03:40   Mr Brain Wo Contrast  Result Date: 11/24/2016 CLINICAL DATA:  Confusion and hallucinations. EXAM: MRI HEAD WITHOUT CONTRAST TECHNIQUE: Multiplanar, multiecho pulse sequences of the brain and surrounding structures were obtained without intravenous contrast. COMPARISON:  Head CT 11/22/2016 FINDINGS: Partial and motion degraded study. Sagittal T1, diffusion, axial T2, axial FLAIR sequences were obtained, with multiple motion degraded slices. Brain: No acute infarction, hemorrhage, hydrocephalus,  extra-axial collection or mass lesion. No evidence of atrophy or white matter disease. Vascular: Grossly preserved major vessel flow voids. Skull and upper cervical spine: Negative Sinuses/Orbits: Negative IMPRESSION: Incomplete, motion degraded study as above.  No abnormal finding. Electronically Signed   By: Monte Fantasia M.D.   On: 11/24/2016 12:49    Lab Results: Basic Metabolic Panel:  Recent Labs  11/30/16 0550 12/01/16 0525  NA 141 140  K 3.4* 3.7  CL 112* 111  CO2 22 20*  GLUCOSE 116* 130*  BUN 41* 46*  CREATININE 4.24* 4.20*  CALCIUM 8.7* 8.8*  PHOS 3.2 2.3*   Liver Function Tests:  Recent Labs  11/30/16 0550 12/01/16 0525  ALBUMIN 2.9* 3.0*     CBC:  Recent Labs  11/30/16 0550 12/01/16 0525  WBC 5.6 5.5  HGB 10.7* 10.4*  HCT 32.5* 31.5*  MCV 86.7 85.6  PLT 148* 147*    Recent Results (from the past 240 hour(s))  Urine culture     Status: Abnormal   Collection Time: 11/22/16 10:55 PM  Result Value Ref Range Status   Specimen Description URINE, CLEAN CATCH  Final   Special Requests NONE  Final   Culture MULTIPLE SPECIES PRESENT, SUGGEST RECOLLECTION (A)  Final   Report Status 11/24/2016 FINAL  Final     Hospital Course: This is a 51 year old who  came to the emergency department because of poor responsiveness. She was found to have malignant hypertension but she also was acutely psychotic. According to her family she's been hallucinating. She has imagined that her dog is dead and it is not that her family members have been in the room with her when they have not and she sees things that are not there. She had psychiatric consultation by telephone twice during the hospitalization. She had neurology consultation. It was felt that she probably had multi-factorial psychosis with problems with bipolar 1 disease and also some metabolic component. She has stage V renal failure and is approaching dialysis now. She has diabetes and that has not been totally  controlled at home. Her renal failure is felt to be related to her diabetes and hypertension. She was given Risperdal her other medications were reduced and she became better but not back to baseline. She's not going to be able to return home so she can be transferred to skilled care facility.  Discharge Exam: Blood pressure (!) 142/70, pulse 70, temperature 97.8 F (36.6 C), temperature source Oral, resp. rate 16, height 5\' 8"  (1.727 m), weight 117.3 kg (258 lb 9.6 oz), SpO2 95 %. She is awake but still very confused and delusional. Chest is clear. Heart is regular. Abdomen is soft.  Disposition: To skilled care facility. She needs to have basic metabolic profile on 27/01/8674. Follow-up with Dr. Lowanda Foster for nephrology. She will require dialysis soon. Diet is listed as low sodium heart healthy but should in fact the carb modified renal diet  Discharge Instructions    Diet - low sodium heart healthy    Complete by:  As directed    Increase activity slowly    Complete by:  As directed         Signed: Julian Askin L   12/01/2016, 8:17 AM

## 2016-12-01 NOTE — Progress Notes (Addendum)
Subjective: She is better. She is more alert. She is still confused and still has delusions.  Objective: Vital signs in last 24 hours: Temp:  [97.4 F (36.3 C)-98.1 F (36.7 C)] 97.8 F (36.6 C) (05/07 0515) Pulse Rate:  [70-79] 70 (05/07 0515) Resp:  [16-18] 16 (05/07 0515) BP: (112-142)/(62-88) 142/70 (05/07 0515) SpO2:  [93 %-99 %] 95 % (05/07 0727) Weight change:  Last BM Date: 11/28/16  Intake/Output from previous day: 05/06 0701 - 05/07 0700 In: 240 [P.O.:240] Out: 1150 [Urine:1150]  PHYSICAL EXAM General appearance: alert and mild distress Resp: clear to auscultation bilaterally Cardio: regular rate and rhythm, S1, S2 normal, no murmur, click, rub or gallop GI: soft, non-tender; bowel sounds normal; no masses,  no organomegaly Extremities: extremities normal, atraumatic, no cyanosis or edema Skin warm and dry  Lab Results:  Results for orders placed or performed during the hospital encounter of 11/20/16 (from the past 48 hour(s))  CBC     Status: Abnormal   Collection Time: 11/30/16  5:50 AM  Result Value Ref Range   WBC 5.6 4.0 - 10.5 K/uL   RBC 3.75 (L) 3.87 - 5.11 MIL/uL   Hemoglobin 10.7 (L) 12.0 - 15.0 g/dL   HCT 32.5 (L) 36.0 - 46.0 %   MCV 86.7 78.0 - 100.0 fL   MCH 28.5 26.0 - 34.0 pg   MCHC 32.9 30.0 - 36.0 g/dL   RDW 15.0 11.5 - 15.5 %   Platelets 148 (L) 150 - 400 K/uL  Renal function panel     Status: Abnormal   Collection Time: 11/30/16  5:50 AM  Result Value Ref Range   Sodium 141 135 - 145 mmol/L   Potassium 3.4 (L) 3.5 - 5.1 mmol/L   Chloride 112 (H) 101 - 111 mmol/L   CO2 22 22 - 32 mmol/L   Glucose, Bld 116 (H) 65 - 99 mg/dL   BUN 41 (H) 6 - 20 mg/dL   Creatinine, Ser 4.24 (H) 0.44 - 1.00 mg/dL   Calcium 8.7 (L) 8.9 - 10.3 mg/dL   Phosphorus 3.2 2.5 - 4.6 mg/dL   Albumin 2.9 (L) 3.5 - 5.0 g/dL   GFR calc non Af Amer 11 (L) >60 mL/min   GFR calc Af Amer 13 (L) >60 mL/min    Comment: (NOTE) The eGFR has been calculated using the CKD  EPI equation. This calculation has not been validated in all clinical situations. eGFR's persistently <60 mL/min signify possible Chronic Kidney Disease.    Anion gap 7 5 - 15  CBC     Status: Abnormal   Collection Time: 12/01/16  5:25 AM  Result Value Ref Range   WBC 5.5 4.0 - 10.5 K/uL   RBC 3.68 (L) 3.87 - 5.11 MIL/uL   Hemoglobin 10.4 (L) 12.0 - 15.0 g/dL   HCT 31.5 (L) 36.0 - 46.0 %   MCV 85.6 78.0 - 100.0 fL   MCH 28.3 26.0 - 34.0 pg   MCHC 33.0 30.0 - 36.0 g/dL   RDW 14.9 11.5 - 15.5 %   Platelets 147 (L) 150 - 400 K/uL  Renal function panel     Status: Abnormal   Collection Time: 12/01/16  5:25 AM  Result Value Ref Range   Sodium 140 135 - 145 mmol/L   Potassium 3.7 3.5 - 5.1 mmol/L   Chloride 111 101 - 111 mmol/L   CO2 20 (L) 22 - 32 mmol/L   Glucose, Bld 130 (H) 65 - 99 mg/dL  BUN 46 (H) 6 - 20 mg/dL   Creatinine, Ser 4.70 (H) 0.44 - 1.00 mg/dL   Calcium 8.8 (L) 8.9 - 10.3 mg/dL   Phosphorus 2.3 (L) 2.5 - 4.6 mg/dL   Albumin 3.0 (L) 3.5 - 5.0 g/dL   GFR calc non Af Amer 12 (L) >60 mL/min   GFR calc Af Amer 13 (L) >60 mL/min    Comment: (NOTE) The eGFR has been calculated using the CKD EPI equation. This calculation has not been validated in all clinical situations. eGFR's persistently <60 mL/min signify possible Chronic Kidney Disease.    Anion gap 9 5 - 15    ABGS No results for input(s): PHART, PO2ART, TCO2, HCO3 in the last 72 hours.  Invalid input(s): PCO2 CULTURES Recent Results (from the past 240 hour(s))  Urine culture     Status: Abnormal   Collection Time: 11/22/16 10:55 PM  Result Value Ref Range Status   Specimen Description URINE, CLEAN CATCH  Final   Special Requests NONE  Final   Culture MULTIPLE SPECIES PRESENT, SUGGEST RECOLLECTION (A)  Final   Report Status 11/24/2016 FINAL  Final   Studies/Results: No results found.  Medications:  Prior to Admission:  Prescriptions Prior to Admission  Medication Sig Dispense Refill Last Dose   . albuterol (PROVENTIL HFA;VENTOLIN HFA) 108 (90 Base) MCG/ACT inhaler Inhale 2 puffs into the lungs every 4 (four) hours as needed for wheezing or shortness of breath. 1 Inhaler 0 11/20/2016 at Unknown time  . albuterol (PROVENTIL) (2.5 MG/3ML) 0.083% nebulizer solution Inhale 3 mLs (2.5 mg total) into the lungs 3 (three) times daily. 75 mL 12 11/20/2016 at Unknown time  . ALPRAZolam (XANAX) 1 MG tablet Take 1 mg by mouth 4 (four) times daily.     11/20/2016 at Unknown time  . atorvastatin (LIPITOR) 20 MG tablet Take 20 mg by mouth daily. Take along with 10 mg tablet to equal 30 mg daily.   11/19/2016 at Unknown time  . baclofen (LIORESAL) 20 MG tablet Take 20 mg by mouth 3 (three) times daily as needed for muscle spasms.   11/19/2016 at Unknown time  . carvedilol (COREG) 12.5 MG tablet Take 1 tablet (12.5 mg total) by mouth 2 (two) times daily with a meal. 60 tablet 0 11/20/2016 at 1000  . colchicine 0.6 MG tablet Take 1 tablet (0.6 mg total) by mouth every other day. 16 tablet 12   . estradiol (ESTRACE) 0.5 MG tablet Take 0.5 mg by mouth daily.     . fluticasone (FLONASE) 50 MCG/ACT nasal spray Place 2 sprays into both nostrils at bedtime.   11/19/2016 at Unknown time  . hydrALAZINE (APRESOLINE) 25 MG tablet Take 1 tablet (25 mg total) by mouth every 8 (eight) hours. 90 tablet 12 11/20/2016 at Unknown time  . HYDROcodone-acetaminophen (NORCO) 10-325 MG tablet Take 1 tablet by mouth every 4 (four) hours as needed for moderate pain.   11/20/2016 at Unknown time  . lisinopril-hydrochlorothiazide (PRINZIDE,ZESTORETIC) 20-12.5 MG tablet Take 1 tablet by mouth daily.   11/20/2016 at Unknown time  . loperamide (IMODIUM) 2 MG capsule Take 1 capsule (2 mg total) by mouth 4 (four) times daily as needed for diarrhea or loose stools. 12 capsule 0 11/20/2016 at Unknown time  . metoCLOPramide (REGLAN) 10 MG tablet Take 1 tablet (10 mg total) by mouth every 6 (six) hours as needed for nausea (or headache). 30 tablet 0  11/20/2016 at Unknown time  . PARoxetine (PAXIL) 20 MG tablet Take 1 daily.  This is to prevent panic attacks (Patient taking differently: Take 40 mg by mouth daily. Take 1 daily. This is to prevent panic attacks) 30 tablet 12 11/19/2016 at Unknown time  . polyethylene glycol (MIRALAX / GLYCOLAX) packet Take 17 g by mouth daily. 14 each 0 Past Week at Unknown time  . torsemide (DEMADEX) 20 MG tablet Take 2 tablets (40 mg total) by mouth daily. 60 tablet 12 11/19/2016 at Unknown time  . atorvastatin (LIPITOR) 10 MG tablet Take 10 mg by mouth daily. Take along with 20 mg tablet to equal 30 mg daily.   Not Taking at Unknown time  . rOPINIRole (REQUIP) 1 MG tablet Take 1 mg by mouth at bedtime.    Not Taking at Unknown time   Scheduled: . albuterol  2.5 mg Inhalation TID  . ALPRAZolam  0.25 mg Oral TID  . atorvastatin  10 mg Oral Daily  . atorvastatin  20 mg Oral Daily  . carvedilol  12.5 mg Oral BID WC  . chlorhexidine  15 mL Mouth Rinse BID  . colchicine  0.6 mg Oral Q48H  . dronabinol  2.5 mg Oral BID AC  . estradiol  0.5 mg Oral Daily  . fluticasone  2 spray Each Nare QHS  . heparin  5,000 Units Subcutaneous Q8H  . hydrALAZINE  25 mg Oral Q8H  . LORazepam  0.5 mg Intravenous QHS  . mouth rinse  15 mL Mouth Rinse q12n4p  . PARoxetine  20 mg Oral Daily  . polyethylene glycol  17 g Oral Daily  . potassium chloride  20 mEq Oral BID  . risperiDONE  0.5 mg Oral BID  . sodium chloride flush  3 mL Intravenous Q12H  . torsemide  40 mg Oral Daily   Continuous: . sodium chloride 100 mL/hr at 11/30/16 2319   LDJ:[id number], labetalol, loperamide, metoCLOPramide  Assesment:She was admitted with malignant hypertension but also had hallucinations and psychosis. At baseline she has bipolar 1 disorder and this is probably a multifactorial problem. She is better but is not able to go home. She has renal failure and is approaching dialysis. She has protein calorie malnutrition which is pretty severe and  she's not eating well yet. She is at maximum hospital benefit though and ready to go to a skilled care facility. She is anemic and this is related to her chronic renal failure Principal Problem:   HTN (hypertension), malignant Active Problems:   Diabetes mellitus   Bipolar 1 disorder ([name])   Noncompliance with medication regimen   Panic attack   Confusion   Protein-calorie malnutrition, severe    Plan: Discharge to skilled care facility today    LOS: 9 days   Karlie Aung L 12/01/2016, 8:08 AM

## 2016-12-01 NOTE — Progress Notes (Signed)
heSubjective: Interval History: . Patient offers no complaints.  Objective: Vital signs  24 hours: Temp:  [97.4 F (36.3 C)-98.1 F (36.7 C)] 97.8 F (36.6 C) (05/07 0515) Pulse Rate:  [70-79] 70 (05/07 0515) Resp:  [16-18] 16 (05/07 0515) BP: (112-142)/(62-88) 142/70 (05/07 0515) SpO2:  [93 %-97 %] 95 % (05/07 0727) Weight change:   Intake/Output from previous day: 05/06 0701 - 05/07 0700 In: 240 [P.O.:240] Out: 1150 [Urine:1150] Intake/Output this shift: No intake/output data recorded.  Generally: Patient is alert. Today she seems to be oriented to person and place. Chest is clear to auscultation Heart exam regular rate and rhythm Extremities no edema  Lab Results:  Recent Labs  11/30/16 0550 12/01/16 0525  WBC 5.6 5.5  HGB 10.7* 10.4*  HCT 32.5* 31.5*  PLT 148* 147*   BMET:   Recent Labs  11/30/16 0550 12/01/16 0525  NA 141 140  K 3.4* 3.7  CL 112* 111  CO2 22 20*  GLUCOSE 116* 130*  BUN 41* 46*  CREATININE 4.24* 4.20*  CALCIUM 8.7* 8.8*   No results for input(s): PTH in the last 72 hours. Iron Studies: No results for input(s): IRON, TIBC, TRANSFERRIN, FERRITIN in the last 72 hours.  Studies/Results: No results found.  I have reviewed the patient's current medications.  Assessment/Plan: Problem #1 acute kidney injury superimposed on chronic. Her Renal function seems to be improving.. Patient however doesn't have any uremic signs symptoms. Problem #2 chronic renal failure: Stage V Problem #3 hallucination: Is much better Problem #4 hypokalemia: Potassium is normal today. Problem #5 hypernatremia: her sodium has corrected.  Problem #6 anemia: Her hemoglobin and hematocrit stable   Problem #7 history of bipolar disorder Problem #8 bone mineral disorder: Her calcium And phosphorus is still range. Plan: 1]We will see patient in 2 weeks.   LOS: 9 days   Ritta Hammes S 12/01/2016,9:21 AM

## 2016-12-04 ENCOUNTER — Ambulatory Visit (HOSPITAL_COMMUNITY): Payer: Medicaid Other

## 2016-12-04 ENCOUNTER — Other Ambulatory Visit (HOSPITAL_COMMUNITY): Payer: Medicaid Other

## 2016-12-04 ENCOUNTER — Encounter: Payer: Medicaid Other | Admitting: Vascular Surgery

## 2017-01-09 ENCOUNTER — Encounter: Payer: Self-pay | Admitting: Vascular Surgery

## 2017-01-22 ENCOUNTER — Ambulatory Visit (HOSPITAL_COMMUNITY)
Admission: RE | Admit: 2017-01-22 | Discharge: 2017-01-22 | Disposition: A | Discharge status: Home / Self Care | Payer: Medicaid Other | Source: Ambulatory Visit | Attending: Vascular Surgery | Admitting: Vascular Surgery

## 2017-01-22 ENCOUNTER — Encounter: Payer: Self-pay | Admitting: Vascular Surgery

## 2017-01-22 ENCOUNTER — Ambulatory Visit (INDEPENDENT_AMBULATORY_CARE_PROVIDER_SITE_OTHER): Payer: Medicaid Other | Admitting: Vascular Surgery

## 2017-01-22 ENCOUNTER — Ambulatory Visit (INDEPENDENT_AMBULATORY_CARE_PROVIDER_SITE_OTHER)
Admission: RE | Admit: 2017-01-22 | Discharge: 2017-01-22 | Disposition: A | Discharge status: Home / Self Care | Payer: Medicaid Other | Source: Ambulatory Visit | Attending: Vascular Surgery | Admitting: Vascular Surgery

## 2017-01-22 VITALS — BP 187/99 | HR 64 | Temp 97.3°F | Resp 18 | Ht 68.0 in | Wt 275.0 lb

## 2017-01-22 DIAGNOSIS — N184 Chronic kidney disease, stage 4 (severe): Secondary | ICD-10-CM

## 2017-01-22 DIAGNOSIS — N185 Chronic kidney disease, stage 5: Secondary | ICD-10-CM | POA: Diagnosis not present

## 2017-01-22 DIAGNOSIS — E1122 Type 2 diabetes mellitus with diabetic chronic kidney disease: Secondary | ICD-10-CM | POA: Diagnosis not present

## 2017-01-22 DIAGNOSIS — Z01810 Encounter for preprocedural cardiovascular examination: Secondary | ICD-10-CM | POA: Insufficient documentation

## 2017-01-22 NOTE — Progress Notes (Signed)
Referring Physician: Dr Hinda Lenis  Patient name: Martha [name] MRN: [id number] DOB: 01/18/1966 Sex: female  REASON FOR CONSULT: Hemodialysis access  HPI: Martha [name] is a 51 y.o. female, CK D5 referred by Dr Hinda Lenis for evaluation for placement of long-term hemodialysis access. Her renal failure is thought to be secondary to diabetes. She has had diabetes 10-15 years. She is right-handed. She currently is undergoing evaluation for living related kidney transplant. She is on disability for hypertension diabetes elevated cholesterol and sleep apnea. She has fairly significant neuropathy in the upper and lower extremities. She has numbness and tingling in a typical glove and stocking distribution. She has never had any prior access procedures. She is not currently on hemodialysis. Other medical problems include bipolar disorder, COPD, diabetes, hypertension, obesity, sleep apnea all of which are currently stable.  Past Medical History:  Diagnosis Date  . Acid reflux   . Anemia   . Arthritis   . Bipolar 1 disorder ([place])   . Cervical radiculopathy   . COPD (chronic obstructive pulmonary disease) ([place]) 2014   bronchitis  . Degenerative disc disease, thoracic   . Depression   . Diabetes mellitus   . Hypertension   . Noncompliance with medication regimen   . Obesity (BMI 30-39.9)   . OSA (obstructive sleep apnea)    cpap  . Panic attack   . RLS (restless legs syndrome)    Past Surgical History:  Procedure Laterality Date  . BIOPSY N/A 05/17/2013   Procedure: BIOPSY;  Surgeon: [name], MD;  Location: [facility];  Service: Endoscopy;  Laterality: N/A;  . CHOLECYSTECTOMY N/A 04/27/2014   Procedure: LAPAROSCOPIC CHOLECYSTECTOMY;  Surgeon: [name], MD;  Location: [facility];  Service: General;  Laterality: N/A;  . COLONOSCOPY  06/06/2010   EQA:STMHDQ bleeding secondary to internal hemorrhoids but incomplete evaluation secondary to poor right colon prep/small rectal and sigmoid  colon polyps (hyperplastic). PROPOFOL  . COLONOSCOPY  May 2013   Dr. Gilliam/[facility]: 5 mm a descending colon polyp, hyperplastic. Adequate bowel prep.  . ENDOMETRIAL ABLATION    . ESOPHAGOGASTRODUODENOSCOPY  06/06/2010   QIW:LNLGXQJ erythema and edema of body of stomach, with sessile polypoid lesions. bx benign. no h.pylori  . ESOPHAGOGASTRODUODENOSCOPY (EGD) WITH PROPOFOL N/A 05/17/2013   Dr. Oneida Alar: normal esophagus, moderate nodular gastritis, negative path, empiric Savary dilation  . FLEXIBLE SIGMOIDOSCOPY N/A 05/17/2013   Dr. Oneida Alar: moderate sized internal hemorrhoids  . HEMORRHOID BANDING N/A 05/17/2013   Procedure: HEMORRHOID BANDING;  Surgeon: [name], MD;  Location: [facility];  Service: Endoscopy;  Laterality: N/A;  2 bands placed  . INCISION AND DRAINAGE ABSCESS Left 10/11/2013   Procedure: INCISION AND DRAINAGE AND DEBRIDEMENT LEFT BREAST  ABSCESS;  Surgeon: [name], MD;  Location: [facility];  Service: General;  Laterality: Left;  . IRRIGATION AND DEBRIDEMENT ABSCESS Right 06/01/2013   Procedure: INCISION AND DRAINAGE AND DEBRIDEMENT ABSCESS RIGHT BREAST;  Surgeon: [name], MD;  Location: [facility];  Service: General;  Laterality: Right;  . SAVORY DILATION N/A 05/17/2013   Procedure: SAVORY DILATION;  Surgeon: [name], MD;  Location: [facility];  Service: Endoscopy;  Laterality: N/A;  14/15/16  . TUBAL LIGATION      Family History  Problem Relation Age of Onset  . Adopted: Yes  . Family history unknown: Yes    SOCIAL HISTORY: Social History   Social History  . Marital status: Divorced    Spouse name: N/A  .  Number of children: 2  . Years of education: N/A   Occupational History  . Disabled    Social History Main Topics  . Smoking status: Former Smoker    Packs/day: 1.50    Years: 25.00    Types: Cigarettes    Quit date: 10/23/2007  . Smokeless tobacco: Former Systems developer     Comment: Quit x 6 years ago  . Alcohol use No  . Drug use: No  . Sexual  activity: Yes    Birth control/ protection: Surgical   Other Topics Concern  . Not on file   Social History Narrative   Lives with fiance.      Allergies  Allergen Reactions  . Codeine Nausea And Vomiting    Current Outpatient Prescriptions  Medication Sig Dispense Refill  . albuterol (PROVENTIL HFA;VENTOLIN HFA) 108 (90 Base) MCG/ACT inhaler Inhale 2 puffs into the lungs every 4 (four) hours as needed for wheezing or shortness of breath. 1 Inhaler 0  . albuterol (PROVENTIL) (2.5 MG/3ML) 0.083% nebulizer solution Inhale 3 mLs (2.5 mg total) into the lungs 3 (three) times daily. 75 mL 12  . ALPRAZolam (XANAX) 0.25 MG tablet Take 1 tablet (0.25 mg total) by mouth 3 (three) times daily. 30 tablet 0  . atorvastatin (LIPITOR) 10 MG tablet Take 10 mg by mouth daily. Take along with 20 mg tablet to equal 30 mg daily.    . carvedilol (COREG) 12.5 MG tablet Take 1 tablet (12.5 mg total) by mouth 2 (two) times daily with a meal. 60 tablet 0  . colchicine 0.6 MG tablet Take 1 tablet (0.6 mg total) by mouth every other day. 16 tablet 12  . fluticasone (FLONASE) 50 MCG/ACT nasal spray Place 2 sprays into both nostrils at bedtime.    . hydrALAZINE (APRESOLINE) 25 MG tablet Take 1 tablet (25 mg total) by mouth every 8 (eight) hours. 90 tablet 12  . lisinopril-hydrochlorothiazide (PRINZIDE,ZESTORETIC) 20-12.5 MG tablet Take 1 tablet by mouth daily.    Marland Kitchen loperamide (IMODIUM) 2 MG capsule Take 1 capsule (2 mg total) by mouth 4 (four) times daily as needed for diarrhea or loose stools. 12 capsule 0  . PARoxetine (PAXIL) 20 MG tablet Take 1 daily. This is to prevent panic attacks (Patient taking differently: Take 40 mg by mouth daily. Take 1 daily. This is to prevent panic attacks) 30 tablet 12  . polyethylene glycol (MIRALAX / GLYCOLAX) packet Take 17 g by mouth daily. 14 each 0  . potassium chloride SA (K-DUR,KLOR-CON) 20 MEQ tablet Take 1 tablet (20 mEq total) by mouth 2 (two) times daily.    .  risperiDONE (RISPERDAL) 0.5 MG tablet Take 1 tablet (0.5 mg total) by mouth 2 (two) times daily.    Marland Kitchen rOPINIRole (REQUIP) 1 MG tablet Take 1 mg by mouth at bedtime.     . torsemide (DEMADEX) 20 MG tablet Take 1 tablet (20 mg total) by mouth daily.     No current facility-administered medications for this visit.     ROS:   General:  No weight loss, Fever, chills  HEENT: No recent headaches, no nasal bleeding, no visual changes, no sore throat  Neurologic: No dizziness, blackouts, seizures. No recent symptoms of stroke or mini- stroke. No recent episodes of slurred speech, or temporary blindness.  Cardiac: No recent episodes of chest pain/pressure, no shortness of breath at rest.  + shortness of breath with exertion.  Denies history of atrial fibrillation or irregular heartbeat  Vascular: No history of rest  pain in feet.  No history of claudication.  No history of non-healing ulcer, No history of DVT   Pulmonary: No home oxygen, no productive cough, no hemoptysis,  No asthma or wheezing  Musculoskeletal:  [ ]  Arthritis, [ ]  Low back pain,  [ ]  Joint pain  Hematologic:No history of hypercoagulable state.  No history of easy bleeding.  No history of anemia  Gastrointestinal: No hematochezia or melena,  No gastroesophageal reflux, no trouble swallowing  Urinary: [ ] X chronic Kidney disease, []  on HD - [ ]  MWF or [ ]  TTHS, [ ]  Burning with urination, [ ]  Frequent urination, [ ]  Difficulty urinating;   Skin: No rashes  Psychological: No history of anxiety,  No history of depression   Physical Examination  Vitals:   01/22/17 0917 01/22/17 0919  BP: (!) 185/97 (!) 187/99  Pulse: 64   Resp: 18   Temp: 97.3 F (36.3 C)   TempSrc: Oral   SpO2: 98%   Weight: 275 lb (124.7 kg)   Height: 5\' 8"  (1.727 m)     Body mass index is 41.81 kg/m.  General:  Alert and oriented, no acute distress HEENT: Normal Neck: No bruit or JVD Pulmonary: Clear to auscultation bilaterally Cardiac:  Regular Rate and Rhythm without murmur Abdomen: Soft, non-tender, non-distended, no mass, no scars Skin: No rash Extremity Pulses:  2+ radial, brachial, femoral, 1+ left dorsalis pedis, 2+ right posterior tibial pulses bilaterally Musculoskeletal: No deformity trace ankle and pretibial edema  Neurologic: Upper and lower extremity motor 5/5 and symmetric  DATA:  Patient had an arterial upper extremity duplex exam as well as a vein mapping ultrasound today. Radial artery is quite small less than 2 mm bilaterally. The brachial artery was 4-5 mm in diameter. The upper arm cephalic vein is 3-4 mm in diameter bilaterally. It is quite small in the forearm. The basilic vein is about 3 mm.  ASSESSMENT:  Patient needs long-term hemodialysis access. I believe her best option would be a right brachial cephalic AV fistula   PLAN:  Risks benefits possible palpitations and procedure details including but not limited to bleeding infection ischemic steal non-maturation of the fistula were explained the patient today. She understands and agrees to proceed. This is scheduled for next week.   Ruta Hinds, MD [facility] Office: [phone] Pager: [phone]

## 2017-01-26 ENCOUNTER — Other Ambulatory Visit: Payer: Self-pay

## 2017-01-26 ENCOUNTER — Encounter (HOSPITAL_COMMUNITY): Payer: Self-pay | Admitting: *Deleted

## 2017-01-26 ENCOUNTER — Encounter: Payer: Self-pay | Admitting: Nephrology

## 2017-01-27 ENCOUNTER — Ambulatory Visit (HOSPITAL_COMMUNITY): Payer: Medicaid Other | Admitting: Anesthesiology

## 2017-01-27 ENCOUNTER — Ambulatory Visit (HOSPITAL_COMMUNITY)
Admission: RE | Admit: 2017-01-27 | Discharge: 2017-01-27 | Disposition: A | Discharge status: Home / Self Care | Payer: Medicaid Other | Source: Ambulatory Visit | Attending: Vascular Surgery | Admitting: Vascular Surgery

## 2017-01-27 ENCOUNTER — Encounter (HOSPITAL_COMMUNITY)
Admission: RE | Disposition: A | Discharge status: Home / Self Care | Payer: Self-pay | Source: Ambulatory Visit | Attending: Vascular Surgery

## 2017-01-27 ENCOUNTER — Encounter (HOSPITAL_COMMUNITY): Payer: Self-pay | Admitting: Certified Registered Nurse Anesthetist

## 2017-01-27 DIAGNOSIS — M5412 Radiculopathy, cervical region: Secondary | ICD-10-CM | POA: Insufficient documentation

## 2017-01-27 DIAGNOSIS — Z79899 Other long term (current) drug therapy: Secondary | ICD-10-CM | POA: Diagnosis not present

## 2017-01-27 DIAGNOSIS — Z9114 Patient's other noncompliance with medication regimen: Secondary | ICD-10-CM | POA: Insufficient documentation

## 2017-01-27 DIAGNOSIS — K219 Gastro-esophageal reflux disease without esophagitis: Secondary | ICD-10-CM | POA: Insufficient documentation

## 2017-01-27 DIAGNOSIS — Z6841 Body Mass Index (BMI) 40.0 and over, adult: Secondary | ICD-10-CM | POA: Diagnosis not present

## 2017-01-27 DIAGNOSIS — G4733 Obstructive sleep apnea (adult) (pediatric): Secondary | ICD-10-CM | POA: Diagnosis not present

## 2017-01-27 DIAGNOSIS — G2581 Restless legs syndrome: Secondary | ICD-10-CM | POA: Insufficient documentation

## 2017-01-27 DIAGNOSIS — E1122 Type 2 diabetes mellitus with diabetic chronic kidney disease: Secondary | ICD-10-CM | POA: Insufficient documentation

## 2017-01-27 DIAGNOSIS — F319 Bipolar disorder, unspecified: Secondary | ICD-10-CM | POA: Diagnosis not present

## 2017-01-27 DIAGNOSIS — Z940 Kidney transplant status: Secondary | ICD-10-CM | POA: Diagnosis not present

## 2017-01-27 DIAGNOSIS — E7800 Pure hypercholesterolemia, unspecified: Secondary | ICD-10-CM | POA: Diagnosis not present

## 2017-01-27 DIAGNOSIS — I120 Hypertensive chronic kidney disease with stage 5 chronic kidney disease or end stage renal disease: Secondary | ICD-10-CM | POA: Insufficient documentation

## 2017-01-27 DIAGNOSIS — Z794 Long term (current) use of insulin: Secondary | ICD-10-CM | POA: Diagnosis not present

## 2017-01-27 DIAGNOSIS — N185 Chronic kidney disease, stage 5: Secondary | ICD-10-CM | POA: Diagnosis not present

## 2017-01-27 DIAGNOSIS — N186 End stage renal disease: Secondary | ICD-10-CM | POA: Diagnosis not present

## 2017-01-27 DIAGNOSIS — Z87891 Personal history of nicotine dependence: Secondary | ICD-10-CM | POA: Insufficient documentation

## 2017-01-27 DIAGNOSIS — J449 Chronic obstructive pulmonary disease, unspecified: Secondary | ICD-10-CM | POA: Diagnosis not present

## 2017-01-27 HISTORY — PX: AV FISTULA PLACEMENT: SHX1204

## 2017-01-27 HISTORY — DX: Chronic kidney disease, unspecified: N18.9

## 2017-01-27 LAB — POCT I-STAT 4, (NA,K, GLUC, HGB,HCT)
Glucose, Bld: 148 mg/dL — ABNORMAL HIGH (ref 65–99)
HCT: 26 % — ABNORMAL LOW (ref 36.0–46.0)
Hemoglobin: 8.8 g/dL — ABNORMAL LOW (ref 12.0–15.0)
Potassium: 5 mmol/L (ref 3.5–5.1)
SODIUM: 140 mmol/L (ref 135–145)

## 2017-01-27 LAB — GLUCOSE, CAPILLARY
Glucose-Capillary: 158 mg/dL — ABNORMAL HIGH (ref 65–99)
Glucose-Capillary: 165 mg/dL — ABNORMAL HIGH (ref 65–99)

## 2017-01-27 SURGERY — ARTERIOVENOUS (AV) FISTULA CREATION
Anesthesia: Monitor Anesthesia Care | Site: Arm Upper | Laterality: Right

## 2017-01-27 MED ORDER — LIDOCAINE HCL (PF) 1 % IJ SOLN
INTRAMUSCULAR | Status: AC
  Filled 2017-01-27: qty 30

## 2017-01-27 MED ORDER — LIDOCAINE 2% (20 MG/ML) 5 ML SYRINGE
INTRAMUSCULAR | Status: DC | PRN
  Administered 2017-01-27: 40 mg via INTRAVENOUS

## 2017-01-27 MED ORDER — FENTANYL CITRATE (PF) 250 MCG/5ML IJ SOLN
INTRAMUSCULAR | Status: AC
  Filled 2017-01-27: qty 5

## 2017-01-27 MED ORDER — HYDROMORPHONE HCL 1 MG/ML IJ SOLN: 0.2500 mg | INTRAMUSCULAR | Status: DC | PRN

## 2017-01-27 MED ORDER — DEXTROSE 5 % IV SOLN
1.5000 g | INTRAVENOUS | Status: AC
  Administered 2017-01-27: 1.5 g via INTRAVENOUS
  Filled 2017-01-27: qty 1.5

## 2017-01-27 MED ORDER — MIDAZOLAM HCL 2 MG/2ML IJ SOLN
INTRAMUSCULAR | Status: AC
  Filled 2017-01-27: qty 2

## 2017-01-27 MED ORDER — PROMETHAZINE HCL 25 MG/ML IJ SOLN: 6.2500 mg | INTRAMUSCULAR | Status: DC | PRN

## 2017-01-27 MED ORDER — CHLORHEXIDINE GLUCONATE CLOTH 2 % EX PADS: 6.0000 | MEDICATED_PAD | Freq: Once | CUTANEOUS | Status: DC

## 2017-01-27 MED ORDER — SODIUM CHLORIDE 0.9 % IV SOLN
INTRAVENOUS | Status: DC | PRN
  Administered 2017-01-27: 08:00:00

## 2017-01-27 MED ORDER — PROPOFOL 500 MG/50ML IV EMUL
INTRAVENOUS | Status: DC | PRN
  Administered 2017-01-27: 100 ug/kg/min via INTRAVENOUS

## 2017-01-27 MED ORDER — ONDANSETRON HCL 4 MG/2ML IJ SOLN
INTRAMUSCULAR | Status: DC | PRN
  Administered 2017-01-27: 4 mg via INTRAVENOUS

## 2017-01-27 MED ORDER — LIDOCAINE HCL (PF) 1 % IJ SOLN
INTRAMUSCULAR | Status: DC | PRN
  Administered 2017-01-27: 10 mL

## 2017-01-27 MED ORDER — ONDANSETRON HCL 4 MG/2ML IJ SOLN
INTRAMUSCULAR | Status: AC
  Filled 2017-01-27: qty 2

## 2017-01-27 MED ORDER — MIDAZOLAM HCL 5 MG/5ML IJ SOLN
INTRAMUSCULAR | Status: DC | PRN
  Administered 2017-01-27 (×2): 1 mg via INTRAVENOUS

## 2017-01-27 MED ORDER — 0.9 % SODIUM CHLORIDE (POUR BTL) OPTIME
TOPICAL | Status: DC | PRN
  Administered 2017-01-27: 1000 mL

## 2017-01-27 MED ORDER — HEPARIN SODIUM (PORCINE) 1000 UNIT/ML IJ SOLN
INTRAMUSCULAR | Status: DC | PRN
  Administered 2017-01-27: 5000 [IU] via INTRAVENOUS

## 2017-01-27 MED ORDER — LIDOCAINE 2% (20 MG/ML) 5 ML SYRINGE
INTRAMUSCULAR | Status: AC
  Filled 2017-01-27: qty 5

## 2017-01-27 MED ORDER — HYDROCODONE-ACETAMINOPHEN 10-325 MG PO TABS: 1.0000 | ORAL_TABLET | Freq: Four times a day (QID) | ORAL | 0 refills | Status: DC | PRN

## 2017-01-27 MED ORDER — SODIUM CHLORIDE 0.9 % IV SOLN: INTRAVENOUS | Status: DC

## 2017-01-27 MED ORDER — HEPARIN SODIUM (PORCINE) 1000 UNIT/ML IJ SOLN
INTRAMUSCULAR | Status: AC
  Filled 2017-01-27: qty 1

## 2017-01-27 MED ORDER — SODIUM CHLORIDE 0.9 % IV SOLN
INTRAVENOUS | Status: DC
  Administered 2017-01-27: 07:00:00 via INTRAVENOUS

## 2017-01-27 MED ORDER — FENTANYL CITRATE (PF) 100 MCG/2ML IJ SOLN
INTRAMUSCULAR | Status: DC | PRN
  Administered 2017-01-27 (×2): 25 ug via INTRAVENOUS

## 2017-01-27 MED ORDER — SODIUM CHLORIDE 0.9 % IV SOLN
INTRAVENOUS | Status: DC | PRN
  Administered 2017-01-27: 07:00:00 via INTRAVENOUS

## 2017-01-27 SURGICAL SUPPLY — 37 items
ARMBAND PINK RESTRICT EXTREMIT (MISCELLANEOUS) ×2 IMPLANT
CANISTER SUCT 3000ML PPV (MISCELLANEOUS) ×2 IMPLANT
CANNULA VESSEL 3MM 2 BLNT TIP (CANNULA) ×2 IMPLANT
CLIP TI MEDIUM 6 (CLIP) ×2 IMPLANT
CLIP TI WIDE RED SMALL 6 (CLIP) ×2 IMPLANT
COVER PROBE W GEL 5X96 (DRAPES) IMPLANT
DECANTER SPIKE VIAL GLASS SM (MISCELLANEOUS) IMPLANT
DERMABOND ADVANCED (GAUZE/BANDAGES/DRESSINGS) ×1
DERMABOND ADVANCED .7 DNX12 (GAUZE/BANDAGES/DRESSINGS) ×1 IMPLANT
DRAIN PENROSE 1/4X12 LTX STRL (WOUND CARE) ×2 IMPLANT
ELECT REM PT RETURN 9FT ADLT (ELECTROSURGICAL) ×2
ELECTRODE REM PT RTRN 9FT ADLT (ELECTROSURGICAL) ×1 IMPLANT
GLOVE BIO SURGEON STRL SZ 6.5 (GLOVE) ×6 IMPLANT
GLOVE BIO SURGEON STRL SZ7 (GLOVE) ×4 IMPLANT
GLOVE BIO SURGEON STRL SZ7.5 (GLOVE) ×4 IMPLANT
GLOVE BIOGEL PI IND STRL 6.5 (GLOVE) ×3 IMPLANT
GLOVE BIOGEL PI IND STRL 7.0 (GLOVE) ×2 IMPLANT
GLOVE BIOGEL PI IND STRL 8 (GLOVE) ×1 IMPLANT
GLOVE BIOGEL PI INDICATOR 6.5 (GLOVE) ×3
GLOVE BIOGEL PI INDICATOR 7.0 (GLOVE) ×2
GLOVE BIOGEL PI INDICATOR 8 (GLOVE) ×1
GLOVE SURG SS PI 8.0 STRL IVOR (GLOVE) ×2 IMPLANT
GOWN STRL REUS W/ TWL LRG LVL3 (GOWN DISPOSABLE) ×4 IMPLANT
GOWN STRL REUS W/TWL LRG LVL3 (GOWN DISPOSABLE) ×4
KIT BASIN OR (CUSTOM PROCEDURE TRAY) ×2 IMPLANT
KIT ROOM TURNOVER OR (KITS) ×2 IMPLANT
LOOP VESSEL MINI RED (MISCELLANEOUS) IMPLANT
NS IRRIG 1000ML POUR BTL (IV SOLUTION) ×2 IMPLANT
PACK CV ACCESS (CUSTOM PROCEDURE TRAY) ×2 IMPLANT
PAD ARMBOARD 7.5X6 YLW CONV (MISCELLANEOUS) ×4 IMPLANT
SPONGE SURGIFOAM ABS GEL 100 (HEMOSTASIS) IMPLANT
SUT PROLENE 7 0 BV 1 (SUTURE) ×4 IMPLANT
SUT VIC AB 3-0 SH 27 (SUTURE) ×1
SUT VIC AB 3-0 SH 27X BRD (SUTURE) ×1 IMPLANT
SUT VICRYL 4-0 PS2 18IN ABS (SUTURE) ×2 IMPLANT
UNDERPAD 30X30 (UNDERPADS AND DIAPERS) ×2 IMPLANT
WATER STERILE IRR 1000ML POUR (IV SOLUTION) ×2 IMPLANT

## 2017-01-27 NOTE — Discharge Instructions (Signed)
° ° °  01/27/2017 Martha [name] [id number] April 05, 1966  Surgeon(s): Fields, Jessy Oto, MD  Procedure(s): Right Brachial Cephalic AV fistula creation  x Do not stick fistula for 12 weeks

## 2017-01-27 NOTE — Op Note (Signed)
Procedure: Right Brachial Cephalic AV fistula  Preop: ESRD  Postop: ESRD  Anesthesia: Local with IV sedation  Assistant: Leontine Locket PA-C  Findings: 3 mm cephalic vein  Procedure: After obtaining informed consent, the patient was taken to the operating room.  After induction of general anesthesia, the left upper extremity was prepped and draped in usual sterile fashion.  A transverse incision was then made near the antecubital crease the right arm. The incision was carried into the subcutaneous tissues down to level of the cephalic vein. The cephalic vein was approximately 3 mm in diameter. It was of good quality. This was dissected free circumferentially and small side branches ligated and divided between silk ties or clips. Next the brachial artery was dissected free in the medial portion of the incision. The artery was  3 mm in diameter. The vessel loops were placed proximal and distal to the planned site of arteriotomy. The patient was given 5000 units of intravenous heparin. After appropriate circulation time, the vessel loops were used to control the artery. A longitudinal opening was made in the brachial artery.  The vein was ligated distally with a 2-0 silk tie. The vein was controlled proximally with a fine bulldog clamp. The vein was then swung over to the artery and sewn end of vein to side of artery using a running 7-0 Prolene suture. Just prior to completion of the anastomosis, everything was fore bled back bled and thoroughly flushed. The anastomosis was secured, vessel loops released, and there was a palpable thrill in the fistula immediately. After hemostasis was obtained, the subcutaneous tissues were reapproximated using a running 3-0 Vicryl suture. The skin was then closed with a 4 0 Vicryl subcuticular stitch. Dermabond was applied to the skin incision.  The patient had a palpable radial pulse at the end of the case.  Ruta Hinds, MD [facility]  [place] Office: [phone] Pager: [phone]

## 2017-01-27 NOTE — Anesthesia Procedure Notes (Signed)
Procedure Name: MAC Date/Time: 01/27/2017 7:40 AM Performed by: Garrison Columbus T Pre-anesthesia Checklist: Patient identified, Emergency Drugs available, Suction available and Patient being monitored Patient Re-evaluated:Patient Re-evaluated prior to inductionOxygen Delivery Method: Simple face mask Preoxygenation: Pre-oxygenation with 100% oxygen Intubation Type: IV induction Placement Confirmation: positive ETCO2 and breath sounds checked- equal and bilateral Dental Injury: Teeth and Oropharynx as per pre-operative assessment

## 2017-01-27 NOTE — Transfer of Care (Signed)
Immediate Anesthesia Transfer of Care Note  Patient: Martha [name]  Procedure(s) Performed: Procedure(s): ARTERIOVENOUS (AV) FISTULA CREATION-RIGHT ARM (Right)  Patient Location: PACU  Anesthesia Type:MAC  Level of Consciousness: awake, alert  and oriented  Airway & Oxygen Therapy: Patient Spontanous Breathing  Post-op Assessment: Report given to RN, Post -op Vital signs reviewed and stable and Patient moving all extremities X 4  Post vital signs: Reviewed and stable  Last Vitals: There were no vitals filed for this visit.  Last Pain: There were no vitals filed for this visit.    Patients Stated Pain Goal: 3 (65/03/54 6568)  Complications: No apparent anesthesia complications

## 2017-01-27 NOTE — Anesthesia Preprocedure Evaluation (Signed)
Anesthesia Evaluation  Patient identified by MRN, date of birth, ID band Patient awake    Reviewed: Allergy & Precautions, NPO status , Patient's Chart, lab work & pertinent test results  Airway Mallampati: II  TM Distance: >3 FB Neck ROM: Full    Dental  (+) Poor Dentition, Dental Advisory Given   Pulmonary sleep apnea , COPD, former smoker,    breath sounds clear to auscultation       Cardiovascular hypertension,  Rhythm:Regular Rate:Normal     Neuro/Psych    GI/Hepatic GERD  ,  Endo/Other  diabetes, Poorly Controlled, Type 1, Insulin DependentMorbid obesity  Renal/GU Renal disease     Musculoskeletal   Abdominal (+) + obese,   Peds  Hematology  (+) anemia ,   Anesthesia Other Findings   Reproductive/Obstetrics                             Anesthesia Physical Anesthesia Plan  ASA: IV  Anesthesia Plan: MAC   Post-op Pain Management:    Induction: Intravenous  PONV Risk Score and Plan: 3 and Ondansetron, Dexamethasone, Propofol and Midazolam  Airway Management Planned: Natural Airway and Simple Face Mask  Additional Equipment:   Intra-op Plan:   Post-operative Plan:   Informed Consent: I have reviewed the patients History and Physical, chart, labs and discussed the procedure including the risks, benefits and alternatives for the proposed anesthesia with the patient or authorized representative who has indicated his/her understanding and acceptance.   Dental advisory given  Plan Discussed with: CRNA  Anesthesia Plan Comments:         Anesthesia Quick Evaluation

## 2017-01-27 NOTE — Interval H&P Note (Signed)
History and Physical Interval Note:  01/27/2017 7:32 AM  Martha [name]  has presented today for surgery, with the diagnosis of Chronic Kidney Disease Stage 5  N18.5  The various methods of treatment have been discussed with the patient and family. After consideration of risks, benefits and other options for treatment, the patient has consented to  Procedure(s): ARTERIOVENOUS (AV) FISTULA CREATION-RIGHT ARM (Right) as a surgical intervention .  The patient's history has been reviewed, patient examined, no change in status, stable for surgery.  I have reviewed the patient's chart and labs.  Questions were answered to the patient's satisfaction.     Ruta Hinds

## 2017-01-27 NOTE — Anesthesia Postprocedure Evaluation (Signed)
Anesthesia Post Note  Patient: Martha [name]  Procedure(s) Performed: Procedure(s) (LRB): ARTERIOVENOUS (AV) FISTULA CREATION-RIGHT ARM (Right)     Patient location during evaluation: PACU Anesthesia Type: MAC Level of consciousness: awake and alert Pain management: pain level controlled Vital Signs Assessment: post-procedure vital signs reviewed and stable Respiratory status: spontaneous breathing, nonlabored ventilation, respiratory function stable and patient connected to nasal cannula oxygen Cardiovascular status: stable and blood pressure returned to baseline Anesthetic complications: no    Last Vitals:  Vitals:   01/27/17 0915 01/27/17 0930  BP: 130/64 (!) 151/74  Pulse: 70 73  Resp: 17 16  Temp:  36.4 C    Last Pain:  Vitals:   01/27/17 0915  PainSc: 0-No pain                 Cezar Misiaszek,JAMES TERRILL

## 2017-01-27 NOTE — H&P (View-Only) (Signed)
Referring Physician: Dr Hinda Lenis  Patient name: Martha [name] MRN: [id number] DOB: November 01, 1965 Sex: female  REASON FOR CONSULT: Hemodialysis access  HPI: Martha [name] is a 51 y.o. female, CK D5 referred by Dr Hinda Lenis for evaluation for placement of long-term hemodialysis access. Her renal failure is thought to be secondary to diabetes. She has had diabetes 10-15 years. She is right-handed. She currently is undergoing evaluation for living related kidney transplant. She is on disability for hypertension diabetes elevated cholesterol and sleep apnea. She has fairly significant neuropathy in the upper and lower extremities. She has numbness and tingling in a typical glove and stocking distribution. She has never had any prior access procedures. She is not currently on hemodialysis. Other medical problems include bipolar disorder, COPD, diabetes, hypertension, obesity, sleep apnea all of which are currently stable.  Past Medical History:  Diagnosis Date  . Acid reflux   . Anemia   . Arthritis   . Bipolar 1 disorder ([place])   . Cervical radiculopathy   . COPD (chronic obstructive pulmonary disease) ([place]) 2014   bronchitis  . Degenerative disc disease, thoracic   . Depression   . Diabetes mellitus   . Hypertension   . Noncompliance with medication regimen   . Obesity (BMI 30-39.9)   . OSA (obstructive sleep apnea)    cpap  . Panic attack   . RLS (restless legs syndrome)    Past Surgical History:  Procedure Laterality Date  . BIOPSY N/A 05/17/2013   Procedure: BIOPSY;  Surgeon: [name], MD;  Location: [facility];  Service: Endoscopy;  Laterality: N/A;  . CHOLECYSTECTOMY N/A 04/27/2014   Procedure: LAPAROSCOPIC CHOLECYSTECTOMY;  Surgeon: [name], MD;  Location: [facility];  Service: General;  Laterality: N/A;  . COLONOSCOPY  06/06/2010   JGG:EZMOQH bleeding secondary to internal hemorrhoids but incomplete evaluation secondary to poor right colon prep/small rectal and sigmoid  colon polyps (hyperplastic). PROPOFOL  . COLONOSCOPY  May 2013   Dr. Gilliam/[facility]: 5 mm a descending colon polyp, hyperplastic. Adequate bowel prep.  . ENDOMETRIAL ABLATION    . ESOPHAGOGASTRODUODENOSCOPY  06/06/2010   UTM:LYYTKPT erythema and edema of body of stomach, with sessile polypoid lesions. bx benign. no h.pylori  . ESOPHAGOGASTRODUODENOSCOPY (EGD) WITH PROPOFOL N/A 05/17/2013   Dr. Oneida Alar: normal esophagus, moderate nodular gastritis, negative path, empiric Savary dilation  . FLEXIBLE SIGMOIDOSCOPY N/A 05/17/2013   Dr. Oneida Alar: moderate sized internal hemorrhoids  . HEMORRHOID BANDING N/A 05/17/2013   Procedure: HEMORRHOID BANDING;  Surgeon: [name], MD;  Location: [facility];  Service: Endoscopy;  Laterality: N/A;  2 bands placed  . INCISION AND DRAINAGE ABSCESS Left 10/11/2013   Procedure: INCISION AND DRAINAGE AND DEBRIDEMENT LEFT BREAST  ABSCESS;  Surgeon: [name], MD;  Location: [facility];  Service: General;  Laterality: Left;  . IRRIGATION AND DEBRIDEMENT ABSCESS Right 06/01/2013   Procedure: INCISION AND DRAINAGE AND DEBRIDEMENT ABSCESS RIGHT BREAST;  Surgeon: [name], MD;  Location[facility];  Service: General;  Laterality: Right;  . SAVORY DILATION N/A 05/17/2013   Procedure: SAVORY DILATION;  Surgeon: [name], MD;  Location: [facility];  Service: Endoscopy;  Laterality: N/A;  14/15/16  . TUBAL LIGATION      Family History  Problem Relation Age of Onset  . Adopted: Yes  . Family history unknown: Yes    SOCIAL HISTORY: Social History   Social History  . Marital status: Divorced    Spouse name: N/A  .  Number of children: 2  . Years of education: N/A   Occupational History  . Disabled    Social History Main Topics  . Smoking status: Former Smoker    Packs/day: 1.50    Years: 25.00    Types: Cigarettes    Quit date: 10/23/2007  . Smokeless tobacco: Former Systems developer     Comment: Quit x 6 years ago  . Alcohol use No  . Drug use: No  . Sexual  activity: Yes    Birth control/ protection: Surgical   Other Topics Concern  . Not on file   Social History Narrative   Lives with fiance.      Allergies  Allergen Reactions  . Codeine Nausea And Vomiting    Current Outpatient Prescriptions  Medication Sig Dispense Refill  . albuterol (PROVENTIL HFA;VENTOLIN HFA) 108 (90 Base) MCG/ACT inhaler Inhale 2 puffs into the lungs every 4 (four) hours as needed for wheezing or shortness of breath. 1 Inhaler 0  . albuterol (PROVENTIL) (2.5 MG/3ML) 0.083% nebulizer solution Inhale 3 mLs (2.5 mg total) into the lungs 3 (three) times daily. 75 mL 12  . ALPRAZolam (XANAX) 0.25 MG tablet Take 1 tablet (0.25 mg total) by mouth 3 (three) times daily. 30 tablet 0  . atorvastatin (LIPITOR) 10 MG tablet Take 10 mg by mouth daily. Take along with 20 mg tablet to equal 30 mg daily.    . carvedilol (COREG) 12.5 MG tablet Take 1 tablet (12.5 mg total) by mouth 2 (two) times daily with a meal. 60 tablet 0  . colchicine 0.6 MG tablet Take 1 tablet (0.6 mg total) by mouth every other day. 16 tablet 12  . fluticasone (FLONASE) 50 MCG/ACT nasal spray Place 2 sprays into both nostrils at bedtime.    . hydrALAZINE (APRESOLINE) 25 MG tablet Take 1 tablet (25 mg total) by mouth every 8 (eight) hours. 90 tablet 12  . lisinopril-hydrochlorothiazide (PRINZIDE,ZESTORETIC) 20-12.5 MG tablet Take 1 tablet by mouth daily.    Marland Kitchen loperamide (IMODIUM) 2 MG capsule Take 1 capsule (2 mg total) by mouth 4 (four) times daily as needed for diarrhea or loose stools. 12 capsule 0  . PARoxetine (PAXIL) 20 MG tablet Take 1 daily. This is to prevent panic attacks (Patient taking differently: Take 40 mg by mouth daily. Take 1 daily. This is to prevent panic attacks) 30 tablet 12  . polyethylene glycol (MIRALAX / GLYCOLAX) packet Take 17 g by mouth daily. 14 each 0  . potassium chloride SA (K-DUR,KLOR-CON) 20 MEQ tablet Take 1 tablet (20 mEq total) by mouth 2 (two) times daily.    .  risperiDONE (RISPERDAL) 0.5 MG tablet Take 1 tablet (0.5 mg total) by mouth 2 (two) times daily.    Marland Kitchen rOPINIRole (REQUIP) 1 MG tablet Take 1 mg by mouth at bedtime.     . torsemide (DEMADEX) 20 MG tablet Take 1 tablet (20 mg total) by mouth daily.     No current facility-administered medications for this visit.     ROS:   General:  No weight loss, Fever, chills  HEENT: No recent headaches, no nasal bleeding, no visual changes, no sore throat  Neurologic: No dizziness, blackouts, seizures. No recent symptoms of stroke or mini- stroke. No recent episodes of slurred speech, or temporary blindness.  Cardiac: No recent episodes of chest pain/pressure, no shortness of breath at rest.  + shortness of breath with exertion.  Denies history of atrial fibrillation or irregular heartbeat  Vascular: No history of rest  pain in feet.  No history of claudication.  No history of non-healing ulcer, No history of DVT   Pulmonary: No home oxygen, no productive cough, no hemoptysis,  No asthma or wheezing  Musculoskeletal:  [ ]  Arthritis, [ ]  Low back pain,  [ ]  Joint pain  Hematologic:No history of hypercoagulable state.  No history of easy bleeding.  No history of anemia  Gastrointestinal: No hematochezia or melena,  No gastroesophageal reflux, no trouble swallowing  Urinary: [ ] X chronic Kidney disease, []  on HD - [ ]  MWF or [ ]  TTHS, [ ]  Burning with urination, [ ]  Frequent urination, [ ]  Difficulty urinating;   Skin: No rashes  Psychological: No history of anxiety,  No history of depression   Physical Examination  Vitals:   01/22/17 0917 01/22/17 0919  BP: (!) 185/97 (!) 187/99  Pulse: 64   Resp: 18   Temp: 97.3 F (36.3 C)   TempSrc: Oral   SpO2: 98%   Weight: 275 lb (124.7 kg)   Height: 5\' 8"  (1.727 m)     Body mass index is 41.81 kg/m.  General:  Alert and oriented, no acute distress HEENT: Normal Neck: No bruit or JVD Pulmonary: Clear to auscultation bilaterally Cardiac:  Regular Rate and Rhythm without murmur Abdomen: Soft, non-tender, non-distended, no mass, no scars Skin: No rash Extremity Pulses:  2+ radial, brachial, femoral, 1+ left dorsalis pedis, 2+ right posterior tibial pulses bilaterally Musculoskeletal: No deformity trace ankle and pretibial edema  Neurologic: Upper and lower extremity motor 5/5 and symmetric  DATA:  Patient had an arterial upper extremity duplex exam as well as a vein mapping ultrasound today. Radial artery is quite small less than 2 mm bilaterally. The brachial artery was 4-5 mm in diameter. The upper arm cephalic vein is 3-4 mm in diameter bilaterally. It is quite small in the forearm. The basilic vein is about 3 mm.  ASSESSMENT:  Patient needs long-term hemodialysis access. I believe her best option would be a right brachial cephalic AV fistula   PLAN:  Risks benefits possible palpitations and procedure details including but not limited to bleeding infection ischemic steal non-maturation of the fistula were explained the patient today. She understands and agrees to proceed. This is scheduled for next week.   Ruta Hinds, MD [facility] Office: [phone] Pager: [phone]

## 2017-01-28 ENCOUNTER — Encounter (HOSPITAL_COMMUNITY): Payer: Self-pay | Admitting: Vascular Surgery

## 2017-01-30 ENCOUNTER — Telehealth: Payer: Self-pay | Admitting: Vascular Surgery

## 2017-01-30 NOTE — Telephone Encounter (Signed)
-----   Message from Mena Goes, RN sent at 01/27/2017  9:51 AM EDT ----- Regarding: 4-6 weeks w/ duplex   ----- Message ----- From: Gabriel Earing, PA-C Sent: 01/27/2017   9:02 AM To: [facility] Charge Pool  S/p right BC AVF 01/27/17.  F/u with Dr. Oneida Alar in 4-6 weeks with duplex.  Thanks

## 2017-01-30 NOTE — Telephone Encounter (Signed)
Spoke to pt to sch lab 03/05/17 at 4:00 and MD 03/19/17 at 4:00.

## 2017-02-25 ENCOUNTER — Other Ambulatory Visit: Payer: Self-pay

## 2017-02-25 DIAGNOSIS — Z4931 Encounter for adequacy testing for hemodialysis: Secondary | ICD-10-CM

## 2017-03-05 ENCOUNTER — Ambulatory Visit (HOSPITAL_COMMUNITY)
Admission: RE | Admit: 2017-03-05 | Discharge: 2017-03-05 | Disposition: A | Discharge status: Home / Self Care | Payer: Medicaid Other | Source: Ambulatory Visit | Attending: Vascular Surgery | Admitting: Vascular Surgery

## 2017-03-05 DIAGNOSIS — Z4931 Encounter for adequacy testing for hemodialysis: Secondary | ICD-10-CM | POA: Diagnosis not present

## 2017-03-10 ENCOUNTER — Encounter: Payer: Self-pay | Admitting: Vascular Surgery

## 2017-03-12 ENCOUNTER — Encounter: Payer: Medicaid Other | Admitting: Vascular Surgery

## 2017-03-19 ENCOUNTER — Ambulatory Visit (INDEPENDENT_AMBULATORY_CARE_PROVIDER_SITE_OTHER): Payer: Self-pay | Admitting: Vascular Surgery

## 2017-03-19 ENCOUNTER — Encounter: Payer: Self-pay | Admitting: Vascular Surgery

## 2017-03-19 VITALS — BP 157/89 | HR 74 | Temp 97.4°F | Resp 20 | Ht 68.0 in | Wt 335.0 lb

## 2017-03-19 DIAGNOSIS — N184 Chronic kidney disease, stage 4 (severe): Secondary | ICD-10-CM

## 2017-03-19 NOTE — Progress Notes (Signed)
Patient is a 51 year old female who returns today for postoperative follow-up after creation of a right brachiocephalic AV fistula. Fistula was placed on 01/27/2017. She denies any numbness or tingling in her hand off of her baseline where she has peripheral neuropathy.. She has no incisional drainage. She currently is not on hemodialysis.  Physical exam:  Vitals:   03/19/17 1559  BP: (!) 157/89  Pulse: 74  Resp: 20  Temp: (!) 97.4 F (36.3 C)  SpO2: 96%  Weight: (!) 335 lb (152 kg)  Height: 5\' 8"  (1.727 m)    Right upper extremity: Palpable thrill in fistula fistula is palpable up to the mid upper arm.  Data: Patient had a duplex ultrasound of her AV fistula on August 10. This showed the diameter was 6-8 mm 5 mm from the scan to the mid arm. It is 10 mm from the skin in the upper arm.  Assessment: Maturing AV fistula right arm.  Plan: The patient will follow-up on as-needed basis. The patient was instructed today how to exercise the fistula to continue to get to develop. The fistula should be ready to cannulate and October 2018 if needed.  Ruta Hinds, MD [facility] [place] Office: [phone] Pager: [phone]

## 2017-04-05 ENCOUNTER — Emergency Department (HOSPITAL_COMMUNITY): Payer: Medicaid Other

## 2017-04-05 ENCOUNTER — Encounter (HOSPITAL_COMMUNITY): Payer: Self-pay | Admitting: Emergency Medicine

## 2017-04-05 ENCOUNTER — Inpatient Hospital Stay (HOSPITAL_COMMUNITY)
Admission: EM | Admit: 2017-04-05 | Discharge: 2017-04-10 | DRG: 189 | Disposition: A | Discharge status: Home / Self Care | Payer: Medicaid Other | Attending: Pulmonary Disease | Admitting: Pulmonary Disease

## 2017-04-05 DIAGNOSIS — J449 Chronic obstructive pulmonary disease, unspecified: Secondary | ICD-10-CM | POA: Diagnosis present

## 2017-04-05 DIAGNOSIS — N179 Acute kidney failure, unspecified: Secondary | ICD-10-CM | POA: Diagnosis present

## 2017-04-05 DIAGNOSIS — E1165 Type 2 diabetes mellitus with hyperglycemia: Secondary | ICD-10-CM | POA: Diagnosis not present

## 2017-04-05 DIAGNOSIS — N185 Chronic kidney disease, stage 5: Secondary | ICD-10-CM | POA: Diagnosis present

## 2017-04-05 DIAGNOSIS — E1122 Type 2 diabetes mellitus with diabetic chronic kidney disease: Secondary | ICD-10-CM | POA: Diagnosis present

## 2017-04-05 DIAGNOSIS — G4733 Obstructive sleep apnea (adult) (pediatric): Secondary | ICD-10-CM | POA: Diagnosis present

## 2017-04-05 DIAGNOSIS — F319 Bipolar disorder, unspecified: Secondary | ICD-10-CM | POA: Diagnosis present

## 2017-04-05 DIAGNOSIS — E785 Hyperlipidemia, unspecified: Secondary | ICD-10-CM | POA: Diagnosis present

## 2017-04-05 DIAGNOSIS — Z9981 Dependence on supplemental oxygen: Secondary | ICD-10-CM

## 2017-04-05 DIAGNOSIS — G9341 Metabolic encephalopathy: Secondary | ICD-10-CM | POA: Diagnosis present

## 2017-04-05 DIAGNOSIS — R0600 Dyspnea, unspecified: Secondary | ICD-10-CM | POA: Diagnosis not present

## 2017-04-05 DIAGNOSIS — D631 Anemia in chronic kidney disease: Secondary | ICD-10-CM | POA: Diagnosis present

## 2017-04-05 DIAGNOSIS — N184 Chronic kidney disease, stage 4 (severe): Secondary | ICD-10-CM

## 2017-04-05 DIAGNOSIS — J810 Acute pulmonary edema: Principal | ICD-10-CM | POA: Diagnosis present

## 2017-04-05 DIAGNOSIS — I10 Essential (primary) hypertension: Secondary | ICD-10-CM | POA: Diagnosis not present

## 2017-04-05 DIAGNOSIS — F410 Panic disorder [episodic paroxysmal anxiety] without agoraphobia: Secondary | ICD-10-CM | POA: Diagnosis present

## 2017-04-05 DIAGNOSIS — Z87891 Personal history of nicotine dependence: Secondary | ICD-10-CM | POA: Diagnosis not present

## 2017-04-05 DIAGNOSIS — E119 Type 2 diabetes mellitus without complications: Secondary | ICD-10-CM

## 2017-04-05 DIAGNOSIS — G2581 Restless legs syndrome: Secondary | ICD-10-CM | POA: Diagnosis present

## 2017-04-05 DIAGNOSIS — R4182 Altered mental status, unspecified: Secondary | ICD-10-CM | POA: Diagnosis not present

## 2017-04-05 DIAGNOSIS — Z79899 Other long term (current) drug therapy: Secondary | ICD-10-CM

## 2017-04-05 DIAGNOSIS — K219 Gastro-esophageal reflux disease without esophagitis: Secondary | ICD-10-CM | POA: Diagnosis present

## 2017-04-05 DIAGNOSIS — I509 Heart failure, unspecified: Secondary | ICD-10-CM | POA: Diagnosis not present

## 2017-04-05 DIAGNOSIS — N170 Acute kidney failure with tubular necrosis: Secondary | ICD-10-CM

## 2017-04-05 DIAGNOSIS — Z794 Long term (current) use of insulin: Secondary | ICD-10-CM

## 2017-04-05 DIAGNOSIS — Z6841 Body Mass Index (BMI) 40.0 and over, adult: Secondary | ICD-10-CM | POA: Diagnosis not present

## 2017-04-05 DIAGNOSIS — D509 Iron deficiency anemia, unspecified: Secondary | ICD-10-CM | POA: Diagnosis present

## 2017-04-05 DIAGNOSIS — I4891 Unspecified atrial fibrillation: Secondary | ICD-10-CM | POA: Diagnosis present

## 2017-04-05 DIAGNOSIS — I120 Hypertensive chronic kidney disease with stage 5 chronic kidney disease or end stage renal disease: Secondary | ICD-10-CM | POA: Diagnosis present

## 2017-04-05 DIAGNOSIS — N189 Chronic kidney disease, unspecified: Secondary | ICD-10-CM

## 2017-04-05 DIAGNOSIS — D538 Other specified nutritional anemias: Secondary | ICD-10-CM

## 2017-04-05 DIAGNOSIS — N10 Acute tubulo-interstitial nephritis: Secondary | ICD-10-CM

## 2017-04-05 DIAGNOSIS — R079 Chest pain, unspecified: Secondary | ICD-10-CM | POA: Diagnosis present

## 2017-04-05 DIAGNOSIS — D649 Anemia, unspecified: Secondary | ICD-10-CM | POA: Diagnosis present

## 2017-04-05 LAB — URINALYSIS, ROUTINE W REFLEX MICROSCOPIC
BILIRUBIN URINE: NEGATIVE
Glucose, UA: 150 mg/dL — AB
KETONES UR: NEGATIVE mg/dL
Nitrite: POSITIVE — AB
Protein, ur: >=300 mg/dL — AB
Specific Gravity, Urine: 1.011 (ref 1.005–1.030)
pH: 5 (ref 5.0–8.0)

## 2017-04-05 LAB — COMPREHENSIVE METABOLIC PANEL
ALK PHOS: 74 U/L (ref 38–126)
ALT: 15 U/L (ref 14–54)
AST: 13 U/L — AB (ref 15–41)
Albumin: 3.5 g/dL (ref 3.5–5.0)
Anion gap: 10 (ref 5–15)
BILIRUBIN TOTAL: 0.4 mg/dL (ref 0.3–1.2)
BUN: 64 mg/dL — AB (ref 6–20)
CALCIUM: 9 mg/dL (ref 8.9–10.3)
CO2: 19 mmol/L — ABNORMAL LOW (ref 22–32)
Chloride: 113 mmol/L — ABNORMAL HIGH (ref 101–111)
Creatinine, Ser: 4.92 mg/dL — ABNORMAL HIGH (ref 0.44–1.00)
GFR calc Af Amer: 11 mL/min — ABNORMAL LOW (ref >60)
GFR calc non Af Amer: 9 mL/min — ABNORMAL LOW (ref >60)
GLUCOSE: 172 mg/dL — AB (ref 65–99)
Potassium: 5.1 mmol/L (ref 3.5–5.1)
Sodium: 142 mmol/L (ref 135–145)
TOTAL PROTEIN: 6.5 g/dL (ref 6.5–8.1)

## 2017-04-05 LAB — CBC WITH DIFFERENTIAL/PLATELET
BASOS ABS: 0 10*3/uL (ref 0.0–0.1)
BASOS PCT: 0 %
Eosinophils Absolute: 0.2 10*3/uL (ref 0.0–0.7)
Eosinophils Relative: 3 %
HEMATOCRIT: 31.9 % — AB (ref 36.0–46.0)
Hemoglobin: 9.7 g/dL — ABNORMAL LOW (ref 12.0–15.0)
LYMPHS PCT: 23 %
Lymphs Abs: 1.4 10*3/uL (ref 0.7–4.0)
MCH: 25.9 pg — ABNORMAL LOW (ref 26.0–34.0)
MCHC: 30.4 g/dL (ref 30.0–36.0)
MCV: 85.1 fL (ref 78.0–100.0)
Monocytes Absolute: 0.3 10*3/uL (ref 0.1–1.0)
Monocytes Relative: 5 %
NEUTROS ABS: 4.2 10*3/uL (ref 1.7–7.7)
NEUTROS PCT: 69 %
Platelets: 182 10*3/uL (ref 150–400)
RBC: 3.75 MIL/uL — AB (ref 3.87–5.11)
RDW: 16.5 % — ABNORMAL HIGH (ref 11.5–15.5)
WBC: 6 10*3/uL (ref 4.0–10.5)

## 2017-04-05 LAB — TROPONIN I: Troponin I: <0.03 ng/mL (ref <0.03)

## 2017-04-05 LAB — LIPASE, BLOOD: Lipase: 27 U/L (ref 11–51)

## 2017-04-05 LAB — TSH: TSH: 3.544 u[IU]/mL (ref 0.350–4.500)

## 2017-04-05 LAB — BRAIN NATRIURETIC PEPTIDE: B Natriuretic Peptide: 820 pg/mL — ABNORMAL HIGH (ref 0.0–100.0)

## 2017-04-05 MED ORDER — FLUTICASONE PROPIONATE 50 MCG/ACT NA SUSP
2.0000 | Freq: Every day | NASAL | Status: DC
  Administered 2017-04-05 – 2017-04-09 (×5): 2 via NASAL
  Filled 2017-04-05 (×2): qty 16

## 2017-04-05 MED ORDER — CARVEDILOL 12.5 MG PO TABS: 12.5000 mg | ORAL_TABLET | Freq: Two times a day (BID) | ORAL | Status: DC

## 2017-04-05 MED ORDER — HEPARIN SODIUM (PORCINE) 5000 UNIT/ML IJ SOLN
5000.0000 [IU] | Freq: Three times a day (TID) | INTRAMUSCULAR | Status: DC
  Administered 2017-04-05 – 2017-04-10 (×14): 5000 [IU] via SUBCUTANEOUS
  Filled 2017-04-05 (×15): qty 1

## 2017-04-05 MED ORDER — ALBUTEROL SULFATE (2.5 MG/3ML) 0.083% IN NEBU
5.0000 mg | INHALATION_SOLUTION | Freq: Once | RESPIRATORY_TRACT | Status: AC
  Administered 2017-04-05: 5 mg via RESPIRATORY_TRACT
  Filled 2017-04-05: qty 6

## 2017-04-05 MED ORDER — HYDRALAZINE HCL 25 MG PO TABS
50.0000 mg | ORAL_TABLET | Freq: Three times a day (TID) | ORAL | Status: DC
  Administered 2017-04-05 – 2017-04-10 (×14): 50 mg via ORAL
  Filled 2017-04-05 (×14): qty 2

## 2017-04-05 MED ORDER — ACETAMINOPHEN 650 MG RE SUPP: 650.0000 mg | Freq: Four times a day (QID) | RECTAL | Status: DC | PRN

## 2017-04-05 MED ORDER — DEXTROSE 5 % IV SOLN
1.0000 g | Freq: Once | INTRAVENOUS | Status: AC
  Administered 2017-04-05: 1 g via INTRAVENOUS
  Filled 2017-04-05: qty 10

## 2017-04-05 MED ORDER — SODIUM CHLORIDE 0.9% FLUSH: 3.0000 mL | INTRAVENOUS | Status: DC | PRN

## 2017-04-05 MED ORDER — ALPRAZOLAM 1 MG PO TABS
1.0000 mg | ORAL_TABLET | Freq: Four times a day (QID) | ORAL | Status: DC | PRN
  Administered 2017-04-05 – 2017-04-09 (×4): 1 mg via ORAL
  Filled 2017-04-05 (×4): qty 1

## 2017-04-05 MED ORDER — ATORVASTATIN CALCIUM 10 MG PO TABS
30.0000 mg | ORAL_TABLET | Freq: Every day | ORAL | Status: DC
  Administered 2017-04-06 – 2017-04-09 (×4): 30 mg via ORAL
  Filled 2017-04-05 (×4): qty 3

## 2017-04-05 MED ORDER — HYDROCODONE-ACETAMINOPHEN 10-325 MG PO TABS
1.0000 | ORAL_TABLET | Freq: Four times a day (QID) | ORAL | Status: DC | PRN
  Administered 2017-04-05 – 2017-04-09 (×7): 1 via ORAL
  Filled 2017-04-05 (×7): qty 1

## 2017-04-05 MED ORDER — SODIUM CHLORIDE 0.9% FLUSH
3.0000 mL | Freq: Two times a day (BID) | INTRAVENOUS | Status: DC
  Administered 2017-04-05 – 2017-04-10 (×10): 3 mL via INTRAVENOUS

## 2017-04-05 MED ORDER — ACETAMINOPHEN 325 MG PO TABS: 650.0000 mg | ORAL_TABLET | Freq: Four times a day (QID) | ORAL | Status: DC | PRN

## 2017-04-05 MED ORDER — SODIUM CHLORIDE 0.9 % IV SOLN: 250.0000 mL | INTRAVENOUS | Status: DC | PRN

## 2017-04-05 MED ORDER — FUROSEMIDE 10 MG/ML IJ SOLN: 80.0000 mg | Freq: Every day | INTRAMUSCULAR | Status: DC

## 2017-04-05 MED ORDER — PAROXETINE HCL 20 MG PO TABS
20.0000 mg | ORAL_TABLET | Freq: Every day | ORAL | Status: DC
  Administered 2017-04-06 – 2017-04-10 (×5): 20 mg via ORAL
  Filled 2017-04-05 (×5): qty 1

## 2017-04-05 MED ORDER — FUROSEMIDE 10 MG/ML IJ SOLN
80.0000 mg | Freq: Once | INTRAMUSCULAR | Status: AC
  Administered 2017-04-05: 80 mg via INTRAVENOUS
  Filled 2017-04-05: qty 8

## 2017-04-05 MED ORDER — RISPERIDONE 0.5 MG PO TABS
0.5000 mg | ORAL_TABLET | Freq: Two times a day (BID) | ORAL | Status: DC
  Administered 2017-04-05 – 2017-04-10 (×10): 0.5 mg via ORAL
  Filled 2017-04-05 (×10): qty 1

## 2017-04-05 MED ORDER — ALBUTEROL SULFATE (2.5 MG/3ML) 0.083% IN NEBU
3.0000 mL | INHALATION_SOLUTION | RESPIRATORY_TRACT | Status: DC | PRN
  Administered 2017-04-07 – 2017-04-08 (×2): 3 mL via RESPIRATORY_TRACT
  Filled 2017-04-05 (×2): qty 3

## 2017-04-05 MED ORDER — COLCHICINE 0.6 MG PO TABS
0.6000 mg | ORAL_TABLET | ORAL | Status: DC
  Administered 2017-04-05 – 2017-04-09 (×3): 0.6 mg via ORAL
  Filled 2017-04-05 (×4): qty 1

## 2017-04-05 MED ORDER — ROPINIROLE HCL 1 MG PO TABS
1.0000 mg | ORAL_TABLET | Freq: Every day | ORAL | Status: DC
  Administered 2017-04-05 – 2017-04-09 (×5): 1 mg via ORAL
  Filled 2017-04-05 (×5): qty 1

## 2017-04-05 NOTE — ED Triage Notes (Addendum)
Patient c/o shortness of breath that started yesterday and is progressively getting worse. Patient reports non-productive cough x1 week that improves with use of inhaler. Hx of bronchiotis. Patient also reports wheezing. Denies any chest pain but reports intermittent upper abd pain with diarrhea. Patient states "Pain has been there for a while." Patient reports swelling in lower extremities x2 weeks. Denies hx of CHF.

## 2017-04-05 NOTE — H&P (Signed)
TRH H&P   Patient Demographics:    Martha [name], is a 51 y.o. female  MRN: [id number]   DOB - 08-11-1965  Admit Date - 04/05/2017  Outpatient Primary MD for the patient is Martha Du, MD  Referring MD/NP/PA: Julianne Rice   Outpatient Specialists:  Hinda Lenis (nephrology)  Patient coming from: home  Chief Complaint  Patient presents with  . Shortness of Breath      HPI:    Martha [name]  is a 51 y.o. female, w CKD stage5, Hypertension, Hyperlipidemia, Dm2 with diabetic nephropathy, Anemia, ? Copd, Osa on cpap, apparently c/o increasing dyspnea over the past 2-3 days even though she has had chronic dyspnea for some time.  Pt notes some generalized chest pain without radiation over the left side of the chest for the past 1.5 days. slight dry cough.  Weight gain but she is nonspecific about how much.  + lower ext edema.  Pt denies fever, chills , n/v, abd pain, diarrhea, brbpr.    In ED,  EKG nsr at 75, nl axis, no st-t changes c/w ischemia.  CXR => mild edema.  BNP 820 (prev 126),  Trop <0.03 ,  Wbc 6.0.  U/a=>wbc tntc.    Pt will be admitted for CHF, ARF, and uti     Review of systems:    In addition to the HPI above,  No Fever-chills, No Headache, No changes with Vision or hearing, No problems swallowing food or Liquids, No Abdominal pain, No Nausea or Vommitting, Bowel movements are regular, No Blood in stool or Urine, No dysuria, No new skin rashes or bruises, No new joints pains-aches,  No new weakness, tingling, numbness in any extremity, No recent weight gain or loss, No polyuria, polydypsia or polyphagia, No significant Mental Stressors.  A full 10 point Review of Systems was done, except as stated above, all other Review of Systems were negative.   With Past History of the following :    Past Medical History:  Diagnosis Date  . Acid reflux    takes  Tums  . Anemia   . Arthritis   . Bipolar 1 disorder ([place])   . Cervical radiculopathy   . Chronic kidney disease    Stage 5- 01/25/17  . COPD (chronic obstructive pulmonary disease) ([place]) 2014   bronchitis  . Degenerative disc disease, thoracic   . Depression   . Diabetes mellitus    Type II  . Hypertension   . Noncompliance with medication regimen   . Obesity (BMI 30-39.9)   . OSA (obstructive sleep apnea)    cpap  . Panic attack   . RLS (restless legs syndrome)       Past Surgical History:  Procedure Laterality Date  . AV FISTULA PLACEMENT Right 01/27/2017   Procedure: ARTERIOVENOUS (AV) FISTULA CREATION-RIGHT ARM;  Surgeon: Elam Dutch, MD;  Location: [facility];  Service:  Vascular;  Laterality: Right;  . BIOPSY N/A 05/17/2013   Procedure: BIOPSY;  Surgeon: [name], MD;  Location[facility];  Service: Endoscopy;  Laterality: N/A;  . CHOLECYSTECTOMY N/A 04/27/2014   Procedure: LAPAROSCOPIC CHOLECYSTECTOMY;  Surgeon: [name], MD;  Location: [facility];  Service: General;  Laterality: N/A;  . COLONOSCOPY  06/06/2010   DXA:JOINOM bleeding secondary to internal hemorrhoids but incomplete evaluation secondary to poor right colon prep/small rectal and sigmoid colon polyps (hyperplastic). PROPOFOL  . COLONOSCOPY  May 2013   Dr. Gilliam/[facility]: 5 mm a descending colon polyp, hyperplastic. Adequate bowel prep.  . ENDOMETRIAL ABLATION    . ESOPHAGOGASTRODUODENOSCOPY  06/06/2010   VEH:MCNOBSJ erythema and edema of body of stomach, with sessile polypoid lesions. bx benign. no h.pylori  . ESOPHAGOGASTRODUODENOSCOPY (EGD) WITH PROPOFOL N/A 05/17/2013   Dr. Oneida Alar: normal esophagus, moderate nodular gastritis, negative path, empiric Savary dilation  . FLEXIBLE SIGMOIDOSCOPY N/A 05/17/2013   Dr. Oneida Alar: moderate sized internal hemorrhoids  . HEMORRHOID BANDING N/A 05/17/2013   Procedure: HEMORRHOID BANDING;  Surgeon: [name], MD;  Location[facility];  Service: Endoscopy;   Laterality: N/A;  2 bands placed  . INCISION AND DRAINAGE ABSCESS Left 10/11/2013   Procedure: INCISION AND DRAINAGE AND DEBRIDEMENT LEFT BREAST  ABSCESS;  Surgeon: [name], MD;  Location: [facility];  Service: General;  Laterality: Left;  . IRRIGATION AND DEBRIDEMENT ABSCESS Right 06/01/2013   Procedure: INCISION AND DRAINAGE AND DEBRIDEMENT ABSCESS RIGHT BREAST;  Surgeon: [name], MD;  Location: [facility];  Service: General;  Laterality: Right;  . SAVORY DILATION N/A 05/17/2013   Procedure: SAVORY DILATION;  Surgeon: [name], MD;  Location: [facility];  Service: Endoscopy;  Laterality: N/A;  14/15/16  . TUBAL LIGATION        Social History:     Social History  Substance Use Topics  . Smoking status: Former Smoker    Packs/day: 1.50    Years: 25.00    Types: Cigarettes    Quit date: 10/23/2007  . Smokeless tobacco: Never Used     Comment: Quit x 6 years ago  . Alcohol use No     Lives - at home  Mobility - Walks by self   Family History :     Family History  Problem Relation Age of Onset  . Adopted: Yes  . Family history unknown: Yes      Home Medications:   Prior to Admission medications   Medication Sig Start Date End Date Taking? Authorizing Provider  albuterol (PROVENTIL HFA;VENTOLIN HFA) 108 (90 Base) MCG/ACT inhaler Inhale 2 puffs into the lungs every 4 (four) hours as needed for wheezing or shortness of breath. 07/23/16  Yes Francine Graven, DO  ALPRAZolam Duanne Moron) 1 MG tablet Take 1 mg by mouth 4 (four) times daily as needed for anxiety (nerves).   Yes [provider]  atorvastatin (LIPITOR) 10 MG tablet Take 10 mg by mouth daily. Take along with 20 mg tablet to equal 30 mg daily.   Yes [provider]  atorvastatin (LIPITOR) 20 MG tablet Take 20 mg by mouth daily. With Atorvastatin 10 mg to equal 30 mg total daily   Yes [provider]  carvedilol (COREG) 12.5 MG tablet Take 1 tablet (12.5 mg total) by mouth 2 (two)  times daily with a meal. 05/26/16  Yes Cherene Altes, MD  colchicine 0.6 MG tablet Take 1 tablet (0.6 mg total) by mouth every other day. 11/19/16  Yes Martha Du, MD  fluticasone [facility]) 50 MCG/ACT nasal spray Place 2 sprays into both nostrils at bedtime.   Yes [provider]  hydrALAZINE (APRESOLINE) 25 MG tablet Take 1 tablet (25 mg total) by mouth every 8 (eight) hours. 10/29/16  Yes Martha Du, MD  HYDROcodone-acetaminophen [facility]) 10-325 MG tablet Take 1 tablet by mouth every 6 (six) hours as needed for moderate pain. 01/27/17  Yes Rhyne, Hulen Shouts, PA-C  lisinopril-hydrochlorothiazide (PRINZIDE,ZESTORETIC) 20-12.5 MG tablet Take 1 tablet by mouth daily.   Yes [provider]  loperamide (IMODIUM) 2 MG capsule Take 1 capsule (2 mg total) by mouth 4 (four) times daily as needed for diarrhea or loose stools. 10/23/16  Yes Idol, Almyra Free, PA-C  PARoxetine (PAXIL) 20 MG tablet Take 1 daily. This is to prevent panic attacks Patient taking differently: Take 20-40 mg by mouth daily. Take 1 daily. This is to prevent panic attacks 10/28/16  Yes Martha Du, MD  risperiDONE (RISPERDAL) 0.5 MG tablet Take 1 tablet (0.5 mg total) by mouth 2 (two) times daily. 12/01/16  Yes Martha Du, MD  rOPINIRole (REQUIP) 1 MG tablet Take 1 mg by mouth at bedtime.    Yes [provider]  torsemide (DEMADEX) 20 MG tablet Take 1 tablet (20 mg total) by mouth daily. 12/01/16  Yes Martha Du, MD     Allergies:     Allergies  Allergen Reactions  . Codeine Nausea And Vomiting     Physical Exam:   Vitals  Blood pressure (!) 153/81, pulse 76, temperature 97.6 F (36.4 C), temperature source Oral, resp. rate 19, height '5\' 8"'$  (1.727 m), weight (!) 152 kg (335 lb), SpO2 96 %.   1. General  lying in bed in NAD,   2. Normal affect and insight, Not Suicidal or Homicidal, Awake Alert, Oriented X 3.  3. No F.N deficits, ALL C.Nerves Intact, Strength 5/5 all 4 extremities,  Sensation intact all 4 extremities, Plantars down going.  4. Ears and Eyes appear Normal, Conjunctivae clear, PERRLA. Moist Oral Mucosa.  5. Supple Neck, No JVD, No cervical lymphadenopathy appriciated, No Carotid Bruits.  6. Symmetrical Chest wall movement, Good air movement bilaterally, CTAB.  7. RRR, No Gallops, Rubs or Murmurs, No Parasternal Heave.  8. Positive Bowel Sounds, Abdomen Soft, No tenderness, No organomegaly appriciated,No rebound -guarding or rigidity.  9.  No Cyanosis, Normal Skin Turgor, No Skin Rash or Bruise.  10. Good muscle tone,  joints appear normal , no effusions, Normal ROM.  11. No Palpable Lymph Nodes in Neck or Axillae     Data Review:    CBC  Recent Labs Lab 04/05/17 1738  WBC 6.0  HGB 9.7*  HCT 31.9*  PLT 182  MCV 85.1  MCH 25.9*  MCHC 30.4  RDW 16.5*  LYMPHSABS 1.4  MONOABS 0.3  EOSABS 0.2  BASOSABS 0.0   ------------------------------------------------------------------------------------------------------------------  Chemistries   Recent Labs Lab 04/05/17 1738  NA 142  K 5.1  CL 113*  CO2 19*  GLUCOSE 172*  BUN 64*  CREATININE 4.92*  CALCIUM 9.0  AST 13*  ALT 15  ALKPHOS 74  BILITOT 0.4   ------------------------------------------------------------------------------------------------------------------ estimated creatinine clearance is 21.6 mL/min (A) (by C-G formula based on SCr of 4.92 mg/dL (H)). ------------------------------------------------------------------------------------------------------------------ No results for input(s): TSH, T4TOTAL, T3FREE, THYROIDAB in the last 72 hours.  Invalid input(s): FREET3  Coagulation profile No results for input(s): INR, PROTIME in the last 168 hours. ------------------------------------------------------------------------------------------------------------------- No results for input(s): DDIMER in the last 72  hours. -------------------------------------------------------------------------------------------------------------------  Cardiac Enzymes  Recent Labs Lab 04/05/17 1738  TROPONINI <0.03   ------------------------------------------------------------------------------------------------------------------    Component Value Date/Time   BNP 820.0 (H) 04/05/2017 1738     ---------------------------------------------------------------------------------------------------------------  Urinalysis    Component Value Date/Time   COLORURINE YELLOW 04/05/2017 1921   APPEARANCEUR HAZY (A) 04/05/2017 1921   LABSPEC 1.011 04/05/2017 1921   PHURINE 5.0 04/05/2017 1921   GLUCOSEU 150 (A) 04/05/2017 1921   HGBUR SMALL (A) 04/05/2017 1921   BILIRUBINUR NEGATIVE 04/05/2017 1921   KETONESUR NEGATIVE 04/05/2017 1921   PROTEINUR >=300 (A) 04/05/2017 1921   UROBILINOGEN 0.2 03/17/2015 2236   NITRITE POSITIVE (A) 04/05/2017 1921   LEUKOCYTESUR SMALL (A) 04/05/2017 1921    ----------------------------------------------------------------------------------------------------------------   Imaging Results:    Dg Chest Port 1 View  Result Date: 04/05/2017 CLINICAL DATA:  Patient with shortness of breath, worsening. Nonproductive cough for 1 week. EXAM: PORTABLE CHEST 1 VIEW COMPARISON:  Nonproductive FINDINGS: Monitoring leads overlie the patient. Enlarged cardiac and mediastinal contours. Pulmonary vascular redistribution and mild interstitial pulmonary opacities bilaterally. Possible small bilateral pleural effusions. IMPRESSION: Enlarged cardiac contours. Bilateral perihilar interstitial pulmonary opacities suggestive of mild interstitial edema. Possible small bilateral pleural effusions. Electronically Signed   By: Lovey Newcomer M.D.   On: 04/05/2017 19:01      Assessment & Plan:    Principal Problem:   CHF (congestive heart failure) (HCC) Active Problems:   Essential hypertension   GERD    Anemia   Diabetes mellitus, type 2 (HCC)   Acute on chronic renal failure (HCC)   Chest pain    CHF (EF 60-65%) secondary to fluid overload secondary ESRD Tele Trop I q6h x3 Check cardiac echo Strict I and O Daily weight Lasix '80mg'$  iv bid  UTI Await urine culture Rocephin 1gm iv qday  ARF on CRF (prior ultrasound 09/23/2016=> medical renal disease) Nephrology consult ? May need dialysis STOP Lisinopril/hydrochlorothiazide  Hypertension Increase hydralazine Continue carvedilolol,   Hyperlipidemia Cont lipitor  Anemia Repeat cbc in am Check ferritin , iron, tibc, b12, folate, esr  Dm2 fsbs ac and qhs, ISS  OSA on cpap  Anxiety Continue xanax     DVT Prophylaxis Heparin -  SCDs  AM Labs Ordered, also please review Full Orders  Family Communication: Admission, patients condition and plan of care including tests being ordered have been discussed with the patient  who indicate understanding and agree with the plan and Code Status.  Code Status FULL CODE  Likely DC to  home  Condition GUARDED    Consults called: nephrology   Admission status: inpatient   Time spent in minutes : 45   Jani Gravel M.D on 04/05/2017 at 8:45 PM  Between 7am to 7pm - Pager - [phone]. After 7pm go to [email or web address] - password [facility]  [facility] - Office  [phone]

## 2017-04-05 NOTE — ED Notes (Signed)
Pt got sob getting up from the bed to the bedside toilet.

## 2017-04-05 NOTE — ED Provider Notes (Signed)
[facility] DEPT Provider Note   CSN: [id number] Arrival date & time: 04/05/17  1726     History   Chief Complaint Chief Complaint  Patient presents with  . Shortness of Breath    HPI Martha [name] is a 51 y.o. female.  HPI Patient presents with shortness of breath for the last 2 days worsening overnight. Worse when lying flat. She's had increased swelling to her abdomen and lower extremities. She states she's had a nonproductive cough and has been using an inhaler up until 2 days ago when she ran out. She also complains of one week of intermittent upper abdominal pain. She's had multiple episodes of diarrhea. Denies blood in the stool. No known fever or chills. States she is always cold. She's been using torsemide as prescribed. When askedif she is having any chest pain patient states she is having the same pain in her abdomen which radiates up into her chest. It is mostly on the left side of the chest. Difficulty describing the pain. Past Medical History:  Diagnosis Date  . Acid reflux    takes Tums  . Anemia   . Arthritis   . Bipolar 1 disorder ([place])   . Cervical radiculopathy   . Chronic kidney disease    Stage 5- 01/25/17  . COPD (chronic obstructive pulmonary disease) ([place]) 2014   bronchitis  . Degenerative disc disease, thoracic   . Depression   . Diabetes mellitus    Type II  . Hypertension   . Noncompliance with medication regimen   . Obesity (BMI 30-39.9)   . OSA (obstructive sleep apnea)    cpap  . Panic attack   . RLS (restless legs syndrome)     Patient Active Problem List   Diagnosis Date Noted  . CHF (congestive heart failure) ([place]) 04/05/2017  . Acute psychosis 12/01/2016  . Protein-calorie malnutrition, severe 11/29/2016  . Confusion 11/22/2016  . HTN (hypertension), malignant 11/20/2016  . Diabetes mellitus 11/20/2016  . Bipolar 1 disorder ([place]) 11/20/2016  . Noncompliance with medication regimen 11/20/2016  . Panic attack 11/20/2016  . CKD  stage 5 secondary to hypertension ([place]) 11/15/2016  . AKI (acute kidney injury) ([place]) 11/15/2016  . Chest pain 10/27/2016  . CKD (chronic kidney disease) stage 5, GFR less than 15 ml/min (H[facility]) 10/27/2016  . Uncontrolled type 2 diabetes mellitus with hyperglycemia, without long-term current use of insulin ([place]) 10/27/2016  . Sensory disturbance 10/27/2016  . Left hemiparesis ([place]) 10/27/2016  . Acute on chronic renal failure ([place]) 05/25/2016  . Abdominal pain 04/26/2014  . Cholelithiasis 04/26/2014  . ARF (acute renal failure) ([place]) 04/26/2014  . Dehydration 04/26/2014  . Hyponatremia 04/26/2014  . Gallstones 04/06/2014  . Uncontrolled diabetes mellitus ([place]) 10/09/2013  . Breast abscess 05/30/2013  . Diabetes mellitus, type 2 ([place]) 05/30/2013  . Hyperglycemia 05/30/2013  . Internal hemorrhoids with other complication 91/69/4503  . Umbilical hernia without mention of obstruction or gangrene 04/15/2013  . Anemia 04/15/2013  . DM 05/13/2010  . Hyperlipidemia 05/13/2010  . OBESITY 05/13/2010  . Depression 05/13/2010  . RESTLESS LEG SYNDROME 05/13/2010  . Essential hypertension 05/13/2010  . GERD 05/13/2010  . RECTAL BLEEDING 05/13/2010  . Sleep apnea 05/13/2010  . Dysphagia, oropharyngeal phase 05/13/2010    Past Surgical History:  Procedure Laterality Date  . AV FISTULA PLACEMENT Right 01/27/2017   Procedure: ARTERIOVENOUS (AV) FISTULA CREATION-RIGHT ARM;  Surgeon: Elam Dutch, MD;  Location: [facility];  Service: Vascular;  Laterality: Right;  . BIOPSY  N/A 05/17/2013   Procedure: BIOPSY;  Surgeon: [name], MD;  Location: [facility];  Service: Endoscopy;  Laterality: N/A;  . CHOLECYSTECTOMY N/A 04/27/2014   Procedure: LAPAROSCOPIC CHOLECYSTECTOMY;  Surgeon: [name], MD;  Location: [facility];  Service: General;  Laterality: N/A;  . COLONOSCOPY  06/06/2010   GYK:ZLDJTT bleeding secondary to internal hemorrhoids but incomplete evaluation secondary to poor right colon  prep/small rectal and sigmoid colon polyps (hyperplastic). PROPOFOL  . COLONOSCOPY  May 2013   Dr. Gilliam/[facility]: 5 mm a descending colon polyp, hyperplastic. Adequate bowel prep.  . ENDOMETRIAL ABLATION    . ESOPHAGOGASTRODUODENOSCOPY  06/06/2010   SVX:BLTJQZE erythema and edema of body of stomach, with sessile polypoid lesions. bx benign. no h.pylori  . ESOPHAGOGASTRODUODENOSCOPY (EGD) WITH PROPOFOL N/A 05/17/2013   Dr. Oneida Alar: normal esophagus, moderate nodular gastritis, negative path, empiric Savary dilation  . FLEXIBLE SIGMOIDOSCOPY N/A 05/17/2013   Dr. Oneida Alar: moderate sized internal hemorrhoids  . HEMORRHOID BANDING N/A 05/17/2013   Procedure: HEMORRHOID BANDING;  Surgeon: [name], MD;  Location: [facility];  Service: Endoscopy;  Laterality: N/A;  2 bands placed  . INCISION AND DRAINAGE ABSCESS Left 10/11/2013   Procedure: INCISION AND DRAINAGE AND DEBRIDEMENT LEFT BREAST  ABSCESS;  Surgeon: [name], MD;  Location: [facility];  Service: General;  Laterality: Left;  . IRRIGATION AND DEBRIDEMENT ABSCESS Right 06/01/2013   Procedure: INCISION AND DRAINAGE AND DEBRIDEMENT ABSCESS RIGHT BREAST;  Surgeon: [name], MD;  Location: [facility];  Service: General;  Laterality: Right;  . SAVORY DILATION N/A 05/17/2013   Procedure: SAVORY DILATION;  Surgeon: [name], MD;  Location: [facility];  Service: Endoscopy;  Laterality: N/A;  14/15/16  . TUBAL LIGATION      OB History    Gravida Para Term Preterm AB Living   2 2 1 1   2    SAB TAB Ectopic Multiple Live Births                   Home Medications    Prior to Admission medications   Medication Sig Start Date End Date Taking? Authorizing Provider  albuterol (PROVENTIL HFA;VENTOLIN HFA) 108 (90 Base) MCG/ACT inhaler Inhale 2 puffs into the lungs every 4 (four) hours as needed for wheezing or shortness of breath. 07/23/16  Yes Francine Graven, DO  ALPRAZolam Duanne Moron) 1 MG tablet Take 1 mg by mouth 4 (four) times daily  as needed for anxiety (nerves).   Yes [provider]  atorvastatin (LIPITOR) 10 MG tablet Take 10 mg by mouth daily. Take along with 20 mg tablet to equal 30 mg daily.   Yes [provider]  atorvastatin (LIPITOR) 20 MG tablet Take 20 mg by mouth daily. With Atorvastatin 10 mg to equal 30 mg total daily   Yes [provider]  carvedilol (COREG) 12.5 MG tablet Take 1 tablet (12.5 mg total) by mouth 2 (two) times daily with a meal. 05/26/16  Yes Cherene Altes, MD  colchicine 0.6 MG tablet Take 1 tablet (0.6 mg total) by mouth every other day. 11/19/16  Yes Sinda Du, MD  fluticasone [facility]) 50 MCG/ACT nasal spray Place 2 sprays into both nostrils at bedtime.   Yes [provider]  hydrALAZINE (APRESOLINE) 25 MG tablet Take 1 tablet (25 mg total) by mouth every 8 (eight) hours. 10/29/16  Yes Sinda Du, MD  HYDROcodone-acetaminophen [facility]) 10-325 MG tablet Take 1 tablet by mouth every 6 (six) hours as needed for  moderate pain. 01/27/17  Yes Rhyne, Hulen Shouts, PA-C  lisinopril-hydrochlorothiazide (PRINZIDE,ZESTORETIC) 20-12.5 MG tablet Take 1 tablet by mouth daily.   Yes [provider]  loperamide (IMODIUM) 2 MG capsule Take 1 capsule (2 mg total) by mouth 4 (four) times daily as needed for diarrhea or loose stools. 10/23/16  Yes Idol, Almyra Free, PA-C  PARoxetine (PAXIL) 20 MG tablet Take 1 daily. This is to prevent panic attacks Patient taking differently: Take 20-40 mg by mouth daily. Take 1 daily. This is to prevent panic attacks 10/28/16  Yes Sinda Du, MD  risperiDONE (RISPERDAL) 0.5 MG tablet Take 1 tablet (0.5 mg total) by mouth 2 (two) times daily. 12/01/16  Yes Sinda Du, MD  rOPINIRole (REQUIP) 1 MG tablet Take 1 mg by mouth at bedtime.    Yes [provider]  torsemide (DEMADEX) 20 MG tablet Take 1 tablet (20 mg total) by mouth daily. 12/01/16  Yes Sinda Du, MD    Family History Family History  Problem Relation  Age of Onset  . Adopted: Yes  . Family history unknown: Yes    Social History Social History  Substance Use Topics  . Smoking status: Former Smoker    Packs/day: 1.50    Years: 25.00    Types: Cigarettes    Quit date: 10/23/2007  . Smokeless tobacco: Never Used     Comment: Quit x 6 years ago  . Alcohol use No     Allergies   Codeine   Review of Systems Review of Systems  Constitutional: Negative for chills and fever.  HENT: Negative for sore throat and trouble swallowing.   Eyes: Negative for visual disturbance.  Respiratory: Positive for cough, chest tightness and shortness of breath.   Cardiovascular: Positive for chest pain and leg swelling. Negative for palpitations.  Gastrointestinal: Positive for abdominal pain and diarrhea. Negative for constipation, nausea and vomiting.  Genitourinary: Negative for dysuria and flank pain.  Musculoskeletal: Negative for arthralgias, back pain, myalgias and neck pain.  Skin: Negative for rash.  Neurological: Negative for dizziness, weakness, light-headedness, numbness and headaches.  All other systems reviewed and are negative.    Physical Exam Updated Vital Signs BP (!) 153/81   Pulse 76   Temp 97.6 F (36.4 C) (Oral)   Resp 19   Ht 5\' 8"  (1.727 m)   Wt (!) 152 kg (335 lb)   SpO2 96%   BMI 50.94 kg/m   Physical Exam  Constitutional: She is oriented to person, place, and time. She appears well-developed and well-nourished.  Obese, anxious  HENT:  Head: Normocephalic and atraumatic.  Mouth/Throat: Oropharynx is clear and moist. No oropharyngeal exudate.  Eyes: Pupils are equal, round, and reactive to light. EOM are normal.  Neck: Normal range of motion. Neck supple. No JVD present.  Cardiovascular: Normal rate and regular rhythm.  Exam reveals no gallop and no friction rub.   No murmur heard. Pulmonary/Chest: She exhibits no tenderness.  Increased respiratory effort. Diminished breath sounds in bases with scattered  crackles. No definite wheezing appreciated.  Abdominal: Soft. Bowel sounds are normal. There is tenderness. There is no rebound and no guarding.  Anasarca. Diffuse upper abdominal tenderness to palpation without rebound or guarding. Appears most pronounced in the left upper quadrant.  Musculoskeletal: Normal range of motion. She exhibits edema. She exhibits no tenderness.  3+ bilateral lower extremity pitting edema. Distal pulses intact. Question left CVA tenderness to percussion.  Neurological: She is alert and oriented to person, place, and time.  Moving all extremities without deficit. Sensation light touch intact.  Skin: Skin is warm and dry. Capillary refill takes less than 2 seconds. No rash noted. No erythema.  Psychiatric: Her behavior is normal.  Nursing note and vitals reviewed.    ED Treatments / Results  Labs (all labs ordered are listed, but only abnormal results are displayed) Labs Reviewed  CBC WITH DIFFERENTIAL/PLATELET - Abnormal; Notable for the following:       Result Value   RBC 3.75 (*)    Hemoglobin 9.7 (*)    HCT 31.9 (*)    MCH 25.9 (*)    RDW 16.5 (*)    All other components within normal limits  COMPREHENSIVE METABOLIC PANEL - Abnormal; Notable for the following:    Chloride 113 (*)    CO2 19 (*)    Glucose, Bld 172 (*)    BUN 64 (*)    Creatinine, Ser 4.92 (*)    AST 13 (*)    GFR calc non Af Amer 9 (*)    GFR calc Af Amer 11 (*)    All other components within normal limits  BRAIN NATRIURETIC PEPTIDE - Abnormal; Notable for the following:    B Natriuretic Peptide 820.0 (*)    All other components within normal limits  URINALYSIS, ROUTINE W REFLEX MICROSCOPIC - Abnormal; Notable for the following:    APPearance HAZY (*)    Glucose, UA 150 (*)    Hgb urine dipstick SMALL (*)    Protein, ur >=300 (*)    Nitrite POSITIVE (*)    Leukocytes, UA SMALL (*)    Bacteria, UA MANY (*)    Squamous Epithelial / LPF 0-5 (*)    All other components within  normal limits  URINE CULTURE  LIPASE, BLOOD  TROPONIN I    EKG  EKG Interpretation None       Radiology Dg Chest Port 1 View  Result Date: 04/05/2017 CLINICAL DATA:  Patient with shortness of breath, worsening. Nonproductive cough for 1 week. EXAM: PORTABLE CHEST 1 VIEW COMPARISON:  Nonproductive FINDINGS: Monitoring leads overlie the patient. Enlarged cardiac and mediastinal contours. Pulmonary vascular redistribution and mild interstitial pulmonary opacities bilaterally. Possible small bilateral pleural effusions. IMPRESSION: Enlarged cardiac contours. Bilateral perihilar interstitial pulmonary opacities suggestive of mild interstitial edema. Possible small bilateral pleural effusions. Electronically Signed   By: Lovey Newcomer M.D.   On: 04/05/2017 19:01    Procedures Procedures (including critical care time)  Medications Ordered in ED Medications  cefTRIAXone (ROCEPHIN) 1 g in dextrose 5 % 50 mL IVPB (not administered)  albuterol (PROVENTIL) (2.5 MG/3ML) 0.083% nebulizer solution 5 mg (5 mg Nebulization Given 04/05/17 1801)  furosemide (LASIX) injection 80 mg (80 mg Intravenous Given 04/05/17 1850)     Initial Impression / Assessment and Plan / ED Course  I have reviewed the triage vital signs and the nursing notes.  Pertinent labs & imaging results that were available during my care of the patient were reviewed by me and considered in my medical decision making (see chart for details).    Appears to have urinary tract infection and given left-sided CVA tenderness. Will treat for pyelonephritis. Patient is also fluid overloaded. Given IV dose of Lasix. Discussed with Dr. Maudie Mercury who will admit the patient.   Final Clinical Impressions(s) / ED Diagnoses   Final diagnoses:  Acute pulmonary edema ([place])  Pyelonephritis, acute    New Prescriptions New Prescriptions   No medications on file     Crozier,  Shanon Brow, MD 04/05/17 2031

## 2017-04-06 LAB — IRON AND TIBC
Iron: 29 ug/dL (ref 28–170)
SATURATION RATIOS: 10 % — AB (ref 10.4–31.8)
TIBC: 284 ug/dL (ref 250–450)
UIBC: 255 ug/dL

## 2017-04-06 LAB — GLUCOSE, CAPILLARY
GLUCOSE-CAPILLARY: 130 mg/dL — AB (ref 65–99)
GLUCOSE-CAPILLARY: 153 mg/dL — AB (ref 65–99)
Glucose-Capillary: 139 mg/dL — ABNORMAL HIGH (ref 65–99)
Glucose-Capillary: 123 mg/dL — ABNORMAL HIGH (ref 65–99)

## 2017-04-06 LAB — COMPREHENSIVE METABOLIC PANEL
ALT: 14 U/L (ref 14–54)
AST: 9 U/L — AB (ref 15–41)
Albumin: 3.1 g/dL — ABNORMAL LOW (ref 3.5–5.0)
Alkaline Phosphatase: 63 U/L (ref 38–126)
Anion gap: 10 (ref 5–15)
BILIRUBIN TOTAL: 0.5 mg/dL (ref 0.3–1.2)
BUN: 63 mg/dL — AB (ref 6–20)
CO2: 18 mmol/L — ABNORMAL LOW (ref 22–32)
CREATININE: 5.08 mg/dL — AB (ref 0.44–1.00)
Calcium: 8.7 mg/dL — ABNORMAL LOW (ref 8.9–10.3)
Chloride: 113 mmol/L — ABNORMAL HIGH (ref 101–111)
GFR, EST AFRICAN AMERICAN: 11 mL/min — AB (ref >60)
GFR, EST NON AFRICAN AMERICAN: 9 mL/min — AB (ref >60)
Glucose, Bld: 135 mg/dL — ABNORMAL HIGH (ref 65–99)
POTASSIUM: 4.8 mmol/L (ref 3.5–5.1)
Sodium: 141 mmol/L (ref 135–145)
TOTAL PROTEIN: 6 g/dL — AB (ref 6.5–8.1)

## 2017-04-06 LAB — TROPONIN I: Troponin I: <0.03 ng/mL (ref <0.03)

## 2017-04-06 LAB — CBC
HCT: 28.9 % — ABNORMAL LOW (ref 36.0–46.0)
Hemoglobin: 8.8 g/dL — ABNORMAL LOW (ref 12.0–15.0)
MCH: 25.7 pg — ABNORMAL LOW (ref 26.0–34.0)
MCHC: 30.4 g/dL (ref 30.0–36.0)
MCV: 84.3 fL (ref 78.0–100.0)
PLATELETS: 160 10*3/uL (ref 150–400)
RBC: 3.43 MIL/uL — AB (ref 3.87–5.11)
RDW: 16.3 % — AB (ref 11.5–15.5)
WBC: 5.5 10*3/uL (ref 4.0–10.5)

## 2017-04-06 LAB — VITAMIN B12: Vitamin B-12: 405 pg/mL (ref 180–914)

## 2017-04-06 LAB — SEDIMENTATION RATE: SED RATE: 3 mm/h (ref 0–22)

## 2017-04-06 LAB — FERRITIN: Ferritin: 64 ng/mL (ref 11–307)

## 2017-04-06 MED ORDER — DEXTROSE 5 % IV SOLN
1.0000 g | INTRAVENOUS | Status: DC
  Administered 2017-04-06 – 2017-04-09 (×4): 1 g via INTRAVENOUS
  Filled 2017-04-06 (×7): qty 10

## 2017-04-06 MED ORDER — INSULIN ASPART 100 UNIT/ML ~~LOC~~ SOLN: 0.0000 [IU] | Freq: Every day | SUBCUTANEOUS | Status: DC

## 2017-04-06 MED ORDER — CARVEDILOL 12.5 MG PO TABS
25.0000 mg | ORAL_TABLET | Freq: Two times a day (BID) | ORAL | Status: DC
  Administered 2017-04-06 – 2017-04-10 (×9): 25 mg via ORAL
  Filled 2017-04-06 (×9): qty 2

## 2017-04-06 MED ORDER — FUROSEMIDE 10 MG/ML IJ SOLN
160.0000 mg | Freq: Two times a day (BID) | INTRAVENOUS | Status: DC
  Administered 2017-04-06 – 2017-04-10 (×8): 160 mg via INTRAVENOUS
  Filled 2017-04-06 (×8): qty 16

## 2017-04-06 MED ORDER — INSULIN ASPART 100 UNIT/ML ~~LOC~~ SOLN
0.0000 [IU] | Freq: Three times a day (TID) | SUBCUTANEOUS | Status: DC
  Administered 2017-04-06 – 2017-04-07 (×4): 1 [IU] via SUBCUTANEOUS
  Administered 2017-04-07: 2 [IU] via SUBCUTANEOUS
  Administered 2017-04-07 – 2017-04-08 (×3): 1 [IU] via SUBCUTANEOUS
  Administered 2017-04-09: 2 [IU] via SUBCUTANEOUS
  Administered 2017-04-10: 1 [IU] via SUBCUTANEOUS

## 2017-04-06 NOTE — Progress Notes (Signed)
Subjective: She was admitted last night with heart failure. It appears that she is volume overloaded likely from her chronic renal failure. Echocardiogram done in April of this year showed normal left ventricular function and normal diastolic parameters. She started swelling several days ago has been short of breath and having a lot of difficulty at home. She says she felt like her legs would burst open. She has gained a substantial amount of weight. She feels better now. No chest pain. She is approaching dialysis and her fistula is near mature.  Objective: Vital signs in last 24 hours: Temp:  [97.6 F (36.4 C)-98.5 F (36.9 C)] 98.5 F (36.9 C) (09/10 0533) Pulse Rate:  [72-78] 76 (09/10 0533) Resp:  [18-26] 18 (09/10 0533) BP: (153-178)/(80-107) 157/87 (09/10 0533) SpO2:  [93 %-98 %] 98 % (09/10 0533) Weight:  [151.9 kg (334 lb 15.5 oz)-162.7 kg (358 lb 11.2 oz)] 162.7 kg (358 lb 11.2 oz) (09/10 0500) Weight change:  Last BM Date: 04/04/17  Intake/Output from previous day: 09/09 0701 - 09/10 0700 In: 60 [I.V.:10; IV Piggyback:50] Out: -   PHYSICAL EXAM General appearance: alert, cooperative, mild distress and morbidly obese Resp: rales bilaterally Cardio: regular rate and rhythm, S1, S2 normal, no murmur, click, rub or gallop GI: soft, non-tender; bowel sounds normal; no masses,  no organomegaly Extremities: Significant edema of the legs and venous stasis changes Skin warm and dry  Lab Results:  Results for orders placed or performed during the hospital encounter of 04/05/17 (from the past 48 hour(s))  CBC with Differential/Platelet     Status: Abnormal   Collection Time: 04/05/17  5:38 PM  Result Value Ref Range   WBC 6.0 4.0 - 10.5 K/uL   RBC 3.75 (L) 3.87 - 5.11 MIL/uL   Hemoglobin 9.7 (L) 12.0 - 15.0 g/dL   HCT 31.9 (L) 36.0 - 46.0 %   MCV 85.1 78.0 - 100.0 fL   MCH 25.9 (L) 26.0 - 34.0 pg   MCHC 30.4 30.0 - 36.0 g/dL   RDW 16.5 (H) 11.5 - 15.5 %   Platelets 182 150  - 400 K/uL   Neutrophils Relative % 69 %   Neutro Abs 4.2 1.7 - 7.7 K/uL   Lymphocytes Relative 23 %   Lymphs Abs 1.4 0.7 - 4.0 K/uL   Monocytes Relative 5 %   Monocytes Absolute 0.3 0.1 - 1.0 K/uL   Eosinophils Relative 3 %   Eosinophils Absolute 0.2 0.0 - 0.7 K/uL   Basophils Relative 0 %   Basophils Absolute 0.0 0.0 - 0.1 K/uL  Comprehensive metabolic panel     Status: Abnormal   Collection Time: 04/05/17  5:38 PM  Result Value Ref Range   Sodium 142 135 - 145 mmol/L   Potassium 5.1 3.5 - 5.1 mmol/L   Chloride 113 (H) 101 - 111 mmol/L   CO2 19 (L) 22 - 32 mmol/L   Glucose, Bld 172 (H) 65 - 99 mg/dL   BUN 64 (H) 6 - 20 mg/dL   Creatinine, Ser 4.92 (H) 0.44 - 1.00 mg/dL   Calcium 9.0 8.9 - 10.3 mg/dL   Total Protein 6.5 6.5 - 8.1 g/dL   Albumin 3.5 3.5 - 5.0 g/dL   AST 13 (L) 15 - 41 U/L   ALT 15 14 - 54 U/L   Alkaline Phosphatase 74 38 - 126 U/L   Total Bilirubin 0.4 0.3 - 1.2 mg/dL   GFR calc non Af Amer 9 (L) >60 mL/min   GFR  calc Af Amer 11 (L) >60 mL/min    Comment: (NOTE) The eGFR has been calculated using the CKD EPI equation. This calculation has not been validated in all clinical situations. eGFR's persistently <60 mL/min signify possible Chronic Kidney Disease.    Anion gap 10 5 - 15  Brain natriuretic peptide     Status: Abnormal   Collection Time: 04/05/17  5:38 PM  Result Value Ref Range   B Natriuretic Peptide 820.0 (H) 0.0 - 100.0 pg/mL  Lipase, blood     Status: None   Collection Time: 04/05/17  5:38 PM  Result Value Ref Range   Lipase 27 11 - 51 U/L  Troponin I     Status: None   Collection Time: 04/05/17  5:38 PM  Result Value Ref Range   Troponin I <0.03 <0.03 ng/mL  TSH     Status: None   Collection Time: 04/05/17  5:38 PM  Result Value Ref Range   TSH 3.544 0.350 - 4.500 uIU/mL    Comment: Performed by a 3rd Generation assay with a functional sensitivity of <=0.01 uIU/mL.  Urinalysis, Routine w reflex microscopic     Status: Abnormal    Collection Time: 04/05/17  7:21 PM  Result Value Ref Range   Color, Urine YELLOW YELLOW   APPearance HAZY (A) CLEAR   Specific Gravity, Urine 1.011 1.005 - 1.030   pH 5.0 5.0 - 8.0   Glucose, UA 150 (A) NEGATIVE mg/dL   Hgb urine dipstick SMALL (A) NEGATIVE   Bilirubin Urine NEGATIVE NEGATIVE   Ketones, ur NEGATIVE NEGATIVE mg/dL   Protein, ur >=300 (A) NEGATIVE mg/dL   Nitrite POSITIVE (A) NEGATIVE   Leukocytes, UA SMALL (A) NEGATIVE   RBC / HPF 0-5 0 - 5 RBC/hpf   WBC, UA TOO NUMEROUS TO COUNT 0 - 5 WBC/hpf   Bacteria, UA MANY (A) NONE SEEN   Squamous Epithelial / LPF 0-5 (A) NONE SEEN   WBC Clumps PRESENT    Mucus PRESENT   Troponin I     Status: None   Collection Time: 04/05/17 10:23 PM  Result Value Ref Range   Troponin I <0.03 <0.03 ng/mL  Comprehensive metabolic panel     Status: Abnormal   Collection Time: 04/06/17  3:57 AM  Result Value Ref Range   Sodium 141 135 - 145 mmol/L   Potassium 4.8 3.5 - 5.1 mmol/L   Chloride 113 (H) 101 - 111 mmol/L   CO2 18 (L) 22 - 32 mmol/L   Glucose, Bld 135 (H) 65 - 99 mg/dL   BUN 63 (H) 6 - 20 mg/dL   Creatinine, Ser 5.08 (H) 0.44 - 1.00 mg/dL   Calcium 8.7 (L) 8.9 - 10.3 mg/dL   Total Protein 6.0 (L) 6.5 - 8.1 g/dL   Albumin 3.1 (L) 3.5 - 5.0 g/dL   AST 9 (L) 15 - 41 U/L   ALT 14 14 - 54 U/L   Alkaline Phosphatase 63 38 - 126 U/L   Total Bilirubin 0.5 0.3 - 1.2 mg/dL   GFR calc non Af Amer 9 (L) >60 mL/min   GFR calc Af Amer 11 (L) >60 mL/min    Comment: (NOTE) The eGFR has been calculated using the CKD EPI equation. This calculation has not been validated in all clinical situations. eGFR's persistently <60 mL/min signify possible Chronic Kidney Disease.    Anion gap 10 5 - 15  CBC     Status: Abnormal   Collection Time: 04/06/17  3:57  AM  Result Value Ref Range   WBC 5.5 4.0 - 10.5 K/uL   RBC 3.43 (L) 3.87 - 5.11 MIL/uL   Hemoglobin 8.8 (L) 12.0 - 15.0 g/dL   HCT 28.9 (L) 36.0 - 46.0 %   MCV 84.3 78.0 - 100.0 fL    MCH 25.7 (L) 26.0 - 34.0 pg   MCHC 30.4 30.0 - 36.0 g/dL   RDW 16.3 (H) 11.5 - 15.5 %   Platelets 160 150 - 400 K/uL  Troponin I     Status: None   Collection Time: 04/06/17  3:57 AM  Result Value Ref Range   Troponin I <0.03 <0.03 ng/mL  Glucose, capillary     Status: Abnormal   Collection Time: 04/06/17  7:26 AM  Result Value Ref Range   Glucose-Capillary 130 (H) 65 - 99 mg/dL   Comment 1 Notify RN    Comment 2 Document in Chart     ABGS No results for input(s): PHART, PO2ART, TCO2, HCO3 in the last 72 hours.  Invalid input(s): PCO2 CULTURES No results found for this or any previous visit (from the past 240 hour(s)). Studies/Results: Dg Chest Port 1 View  Result Date: 04/05/2017 CLINICAL DATA:  Patient with shortness of breath, worsening. Nonproductive cough for 1 week. EXAM: PORTABLE CHEST 1 VIEW COMPARISON:  Nonproductive FINDINGS: Monitoring leads overlie the patient. Enlarged cardiac and mediastinal contours. Pulmonary vascular redistribution and mild interstitial pulmonary opacities bilaterally. Possible small bilateral pleural effusions. IMPRESSION: Enlarged cardiac contours. Bilateral perihilar interstitial pulmonary opacities suggestive of mild interstitial edema. Possible small bilateral pleural effusions. Electronically Signed   By: Lovey Newcomer M.D.   On: 04/05/2017 19:01    Medications:  Prior to Admission:  Prescriptions Prior to Admission  Medication Sig Dispense Refill Last Dose  . albuterol (PROVENTIL HFA;VENTOLIN HFA) 108 (90 Base) MCG/ACT inhaler Inhale 2 puffs into the lungs every 4 (four) hours as needed for wheezing or shortness of breath. 1 Inhaler 0 04/05/2017 at 1500  . ALPRAZolam (XANAX) 1 MG tablet Take 1 mg by mouth 4 (four) times daily as needed for anxiety (nerves).   04/05/2017 at 1500  . atorvastatin (LIPITOR) 10 MG tablet Take 10 mg by mouth daily. Take along with 20 mg tablet to equal 30 mg daily.   04/04/2017 at 2100  . atorvastatin (LIPITOR) 20 MG  tablet Take 20 mg by mouth daily. With Atorvastatin 10 mg to equal 30 mg total daily   04/04/2017 at 2100  . carvedilol (COREG) 12.5 MG tablet Take 1 tablet (12.5 mg total) by mouth 2 (two) times daily with a meal. 60 tablet 0 04/04/2017 at 2100  . colchicine 0.6 MG tablet Take 1 tablet (0.6 mg total) by mouth every other day. 16 tablet 12 04/04/2017 at Unknown time  . fluticasone (FLONASE) 50 MCG/ACT nasal spray Place 2 sprays into both nostrils at bedtime.   Past Month at Unknown time  . hydrALAZINE (APRESOLINE) 25 MG tablet Take 1 tablet (25 mg total) by mouth every 8 (eight) hours. 90 tablet 12 04/04/2017 at 2100  . HYDROcodone-acetaminophen (NORCO) 10-325 MG tablet Take 1 tablet by mouth every 6 (six) hours as needed for moderate pain. 6 tablet 0 04/05/2017 at 1500  . lisinopril-hydrochlorothiazide (PRINZIDE,ZESTORETIC) 20-12.5 MG tablet Take 1 tablet by mouth daily.   04/04/2017 at 2100  . loperamide (IMODIUM) 2 MG capsule Take 1 capsule (2 mg total) by mouth 4 (four) times daily as needed for diarrhea or loose stools. 12 capsule 0 Past Month  at Unknown time  . PARoxetine (PAXIL) 20 MG tablet Take 1 daily. This is to prevent panic attacks (Patient taking differently: Take 20-40 mg by mouth daily. Take 1 daily. This is to prevent panic attacks) 30 tablet 12 04/04/2017 at Unknown time  . risperiDONE (RISPERDAL) 0.5 MG tablet Take 1 tablet (0.5 mg total) by mouth 2 (two) times daily.   04/04/2017 at 2100  . rOPINIRole (REQUIP) 1 MG tablet Take 1 mg by mouth at bedtime.    04/04/2017 at 2100  . torsemide (DEMADEX) 20 MG tablet Take 1 tablet (20 mg total) by mouth daily.   04/04/2017 at 2100   Scheduled: . atorvastatin  20 mg Oral Daily  . carvedilol  12.5 mg Oral BID WC  . colchicine  0.6 mg Oral Q48H  . fluticasone  2 spray Each Nare QHS  . furosemide  80 mg Intravenous Daily  . heparin  5,000 Units Subcutaneous Q8H  . hydrALAZINE  50 mg Oral Q8H  . insulin aspart  0-5 Units Subcutaneous QHS  . insulin aspart   0-9 Units Subcutaneous TID WC  . PARoxetine  20-40 mg Oral Daily  . risperiDONE  0.5 mg Oral BID  . rOPINIRole  1 mg Oral QHS  . sodium chloride flush  3 mL Intravenous Q12H   Continuous: . sodium chloride     HQI:ONGEXB chloride, acetaminophen **OR** acetaminophen, albuterol, ALPRAZolam, HYDROcodone-acetaminophen, sodium chloride flush  Assesment: She was admitted with pulmonary edema. Whether this is actually related to heart failure or not is not clear yet. Echocardiogram has been ordered. She has acute on chronic renal failure and her pulmonary edema may be simply related to fluid retention from oliguria. She has diabetes at baseline which is fairly well controlled. She is anemic likely related to her chronic kidney disease she has sleep apnea on CPAP. She also has restless leg syndrome which is being treated. She has had trouble with anxiety and depression and that seems much better. She had a severe episode of encephalopathy. Was related to a urinary tract infection and probably some uremia and she's much better with that. She has hypertension which is not totally controlled. Her lisinopril/hydrochlorothiazide was discontinued. Principal Problem:   CHF (congestive heart failure) (HCC) Active Problems:   Essential hypertension   GERD   Anemia   Diabetes mellitus, type 2 (HCC)   Acute on chronic renal failure (HCC)   Chest pain    Plan: Continue treatments. Echocardiogram today. Continue diuresis. Nephrology consult to see if she needs to start dialysis. Increase carvedilol    LOS: 1 day   Jammie Clink L 04/06/2017, 7:45 AM

## 2017-04-06 NOTE — Plan of Care (Signed)
Problem: Food- and Nutrition-Related Knowledge Deficit (NB-1.1) Goal: Nutrition education Formal process to instruct or train a patient/client in a skill or to impart knowledge to help patients/clients voluntarily manage or modify food choices and eating behavior to maintain or improve health. Outcome: Completed/Met Date Met: 04/06/17 Nutrition Education Note  RD consulted for nutrition education regarding new onset CHF.  RD provided "Heart Failure Nutrition Therapy" handout from the [employer].   Reviewed patient's dietary recall. Pt reports consuming a variety of foods, including egg sandwiches, salads and a lot of fast food restaurants. Pt reports consuming [employer] and [employer] often. [employer] bag at bedside during time of visit. Discussed consuming foods outside of the home with caution as they typically have high sodium content.   Provided examples on ways to decrease sodium intake in diet. Discouraged intake of processed foods and use of salt shaker. Pt asked about seasonings of foods. RD discussed sodium free alternatives.  Pt reports consuming a fair amount of canned goods regularly. Discussed the difference between canned and frozen, pt was surprised by this. Encouraged fresh fruits and vegetables as well as whole grain sources of carbohydrates to maximize fiber intake.   RD discussed why it is important for patient to adhere to diet recommendations, and emphasized the role of fluids, foods to avoid, and importance of weighing self daily. Teach back method used.  Pt reports a slight decrease in appeitite over the past 2-3 days.  Pt reports being unsure of weight status but reports changes are likely r/t fluid fluctuations.  Expect fair compliance.  Body mass index is 54.54 kg/m. Pt meets criteria for morbid obesity based on current BMI.  Current diet order is Renal with fluid restriction of 1200 mL. Labs and medications reviewed.   No further  nutrition interventions warranted at this time. RD contact information provided. If additional nutrition issues arise, please re-consult RD.    [place], MS, RDN, LDN 04/06/2017 11:04 AM     

## 2017-04-06 NOTE — Progress Notes (Signed)
Pt on [facility] CPAP. CPAP plugged into red outlet. Pt tolerating well.

## 2017-04-06 NOTE — Progress Notes (Signed)
Gave Ms Martha [name] Advance Directives information. Plan to follow up tomorrow.

## 2017-04-06 NOTE — Consult Note (Signed)
Reason for Consult: Fluid overload and chronic renal failure Referring Physician: Dr. [name] is an 51 y.o. female.  HPI: She is a patient was history of diabetes, hypertension, COPD, sleep apnea, chronic renal failure stage V presently came with complaints of difficulty in breathing, some cough and increasing abdominal girth. Patient however denies any nausea or vomiting. When she was evaluated shows found to have side of fluid overload hence admitted to the hospital. Patient presently states that she is feeling much better. Presently denies any cough, chest pain or sputum production. Her appetite remains good.  Past Medical History:  Diagnosis Date  . Acid reflux    takes Tums  . Anemia   . Arthritis   . Bipolar 1 disorder ([place])   . Cervical radiculopathy   . Chronic kidney disease    Stage 5- 01/25/17  . COPD (chronic obstructive pulmonary disease) ([place]) 2014   bronchitis  . Degenerative disc disease, thoracic   . Depression   . Diabetes mellitus    Type II  . Hypertension   . Noncompliance with medication regimen   . Obesity (BMI 30-39.9)   . OSA (obstructive sleep apnea)    cpap  . Panic attack   . RLS (restless legs syndrome)     Past Surgical History:  Procedure Laterality Date  . AV FISTULA PLACEMENT Right 01/27/2017   Procedure: ARTERIOVENOUS (AV) FISTULA CREATION-RIGHT ARM;  Surgeon: Elam Dutch, MD;  Location: [facility];  Service: Vascular;  Laterality: Right;  . BIOPSY N/A 05/17/2013   Procedure: BIOPSY;  Surgeon: [name], MD;  Location: [facility];  Service: Endoscopy;  Laterality: N/A;  . CHOLECYSTECTOMY N/A 04/27/2014   Procedure: LAPAROSCOPIC CHOLECYSTECTOMY;  Surgeon: [name], MD;  Location: [facility];  Service: General;  Laterality: N/A;  . COLONOSCOPY  06/06/2010   TGY:BWLSLH bleeding secondary to internal hemorrhoids but incomplete evaluation secondary to poor right colon prep/small rectal and sigmoid colon polyps (hyperplastic).  PROPOFOL  . COLONOSCOPY  May 2013   Dr. Gilliam/[facility]: 5 mm a descending colon polyp, hyperplastic. Adequate bowel prep.  . ENDOMETRIAL ABLATION    . ESOPHAGOGASTRODUODENOSCOPY  06/06/2010   TDS:KAJGOTL erythema and edema of body of stomach, with sessile polypoid lesions. bx benign. no h.pylori  . ESOPHAGOGASTRODUODENOSCOPY (EGD) WITH PROPOFOL N/A 05/17/2013   Dr. Oneida Alar: normal esophagus, moderate nodular gastritis, negative path, empiric Savary dilation  . FLEXIBLE SIGMOIDOSCOPY N/A 05/17/2013   Dr. Oneida Alar: moderate sized internal hemorrhoids  . HEMORRHOID BANDING N/A 05/17/2013   Procedure: HEMORRHOID BANDING;  Surgeon: [name], MD;  Location: [facility];  Service: Endoscopy;  Laterality: N/A;  2 bands placed  . INCISION AND DRAINAGE ABSCESS Left 10/11/2013   Procedure: INCISION AND DRAINAGE AND DEBRIDEMENT LEFT BREAST  ABSCESS;  Surgeon: [name], MD;  Location: [facility];  Service: General;  Laterality: Left;  . IRRIGATION AND DEBRIDEMENT ABSCESS Right 06/01/2013   Procedure: INCISION AND DRAINAGE AND DEBRIDEMENT ABSCESS RIGHT BREAST;  Surgeon: [name], MD;  Location: [facility];  Service: General;  Laterality: Right;  . SAVORY DILATION N/A 05/17/2013   Procedure: SAVORY DILATION;  Surgeon: [name], MD;  Location: [facility];  Service: Endoscopy;  Laterality: N/A;  14/15/16  . TUBAL LIGATION      Family History  Problem Relation Age of Onset  . Adopted: Yes  . Family history unknown: Yes    Social History:  reports that she quit smoking about 9 years ago. Her  smoking use included Cigarettes. She has a 37.50 pack-year smoking history. She has never used smokeless tobacco. She reports that she does not drink alcohol or use drugs.  Allergies:  Allergies  Allergen Reactions  . Codeine Nausea And Vomiting    Medications: I have reviewed the patient'[name] current medications.  Results for orders placed or performed during the hospital encounter of 04/05/17 (from the  past 48 hour([name]))  CBC with Differential/Platelet     Status: Abnormal   Collection Time: 04/05/17  5:38 PM  Result Value Ref Range   WBC 6.0 4.0 - 10.5 K/uL   RBC 3.75 (L) 3.87 - 5.11 MIL/uL   Hemoglobin 9.7 (L) 12.0 - 15.0 g/dL   HCT 31.9 (L) 36.0 - 46.0 %   MCV 85.1 78.0 - 100.0 fL   MCH 25.9 (L) 26.0 - 34.0 pg   MCHC 30.4 30.0 - 36.0 g/dL   RDW 16.5 (H) 11.5 - 15.5 %   Platelets 182 150 - 400 K/uL   Neutrophils Relative % 69 %   Neutro Abs 4.2 1.7 - 7.7 K/uL   Lymphocytes Relative 23 %   Lymphs Abs 1.4 0.7 - 4.0 K/uL   Monocytes Relative 5 %   Monocytes Absolute 0.3 0.1 - 1.0 K/uL   Eosinophils Relative 3 %   Eosinophils Absolute 0.2 0.0 - 0.7 K/uL   Basophils Relative 0 %   Basophils Absolute 0.0 0.0 - 0.1 K/uL  Comprehensive metabolic panel     Status: Abnormal   Collection Time: 04/05/17  5:38 PM  Result Value Ref Range   Sodium 142 135 - 145 mmol/L   Potassium 5.1 3.5 - 5.1 mmol/L   Chloride 113 (H) 101 - 111 mmol/L   CO2 19 (L) 22 - 32 mmol/L   Glucose, Bld 172 (H) 65 - 99 mg/dL   BUN 64 (H) 6 - 20 mg/dL   Creatinine, Ser 4.92 (H) 0.44 - 1.00 mg/dL   Calcium 9.0 8.9 - 10.3 mg/dL   Total Protein 6.5 6.5 - 8.1 g/dL   Albumin 3.5 3.5 - 5.0 g/dL   AST 13 (L) 15 - 41 U/L   ALT 15 14 - 54 U/L   Alkaline Phosphatase 74 38 - 126 U/L   Total Bilirubin 0.4 0.3 - 1.2 mg/dL   GFR calc non Af Amer 9 (L) >60 mL/min   GFR calc Af Amer 11 (L) >60 mL/min    Comment: (NOTE) The eGFR has been calculated using the CKD EPI equation. This calculation has not been validated in all clinical situations. eGFR'[name] persistently <60 mL/min signify possible Chronic Kidney Disease.    Anion gap 10 5 - 15  Brain natriuretic peptide     Status: Abnormal   Collection Time: 04/05/17  5:38 PM  Result Value Ref Range   B Natriuretic Peptide 820.0 (H) 0.0 - 100.0 pg/mL  Lipase, blood     Status: None   Collection Time: 04/05/17  5:38 PM  Result Value Ref Range   Lipase 27 11 - 51 U/L   Troponin I     Status: None   Collection Time: 04/05/17  5:38 PM  Result Value Ref Range   Troponin I <0.03 <0.03 ng/mL  TSH     Status: None   Collection Time: 04/05/17  5:38 PM  Result Value Ref Range   TSH 3.544 0.350 - 4.500 uIU/mL    Comment: Performed by a 3rd Generation assay with a functional sensitivity of <=0.01 uIU/mL.  Urinalysis, Routine  w reflex microscopic     Status: Abnormal   Collection Time: 04/05/17  7:21 PM  Result Value Ref Range   Color, Urine YELLOW YELLOW   APPearance HAZY (A) CLEAR   Specific Gravity, Urine 1.011 1.005 - 1.030   pH 5.0 5.0 - 8.0   Glucose, UA 150 (A) NEGATIVE mg/dL   Hgb urine dipstick SMALL (A) NEGATIVE   Bilirubin Urine NEGATIVE NEGATIVE   Ketones, ur NEGATIVE NEGATIVE mg/dL   Protein, ur >=300 (A) NEGATIVE mg/dL   Nitrite POSITIVE (A) NEGATIVE   Leukocytes, UA SMALL (A) NEGATIVE   RBC / HPF 0-5 0 - 5 RBC/hpf   WBC, UA TOO NUMEROUS TO COUNT 0 - 5 WBC/hpf   Bacteria, UA MANY (A) NONE SEEN   Squamous Epithelial / LPF 0-5 (A) NONE SEEN   WBC Clumps PRESENT    Mucus PRESENT   Troponin I     Status: None   Collection Time: 04/05/17 10:23 PM  Result Value Ref Range   Troponin I <0.03 <0.03 ng/mL  Sedimentation rate     Status: None   Collection Time: 04/06/17  3:57 AM  Result Value Ref Range   Sed Rate 3 0 - 22 mm/hr  Comprehensive metabolic panel     Status: Abnormal   Collection Time: 04/06/17  3:57 AM  Result Value Ref Range   Sodium 141 135 - 145 mmol/L   Potassium 4.8 3.5 - 5.1 mmol/L   Chloride 113 (H) 101 - 111 mmol/L   CO2 18 (L) 22 - 32 mmol/L   Glucose, Bld 135 (H) 65 - 99 mg/dL   BUN 63 (H) 6 - 20 mg/dL   Creatinine, Ser 5.08 (H) 0.44 - 1.00 mg/dL   Calcium 8.7 (L) 8.9 - 10.3 mg/dL   Total Protein 6.0 (L) 6.5 - 8.1 g/dL   Albumin 3.1 (L) 3.5 - 5.0 g/dL   AST 9 (L) 15 - 41 U/L   ALT 14 14 - 54 U/L   Alkaline Phosphatase 63 38 - 126 U/L   Total Bilirubin 0.5 0.3 - 1.2 mg/dL   GFR calc non Af Amer 9 (L) >60 mL/min    GFR calc Af Amer 11 (L) >60 mL/min    Comment: (NOTE) The eGFR has been calculated using the CKD EPI equation. This calculation has not been validated in all clinical situations. eGFR'[name] persistently <60 mL/min signify possible Chronic Kidney Disease.    Anion gap 10 5 - 15  CBC     Status: Abnormal   Collection Time: 04/06/17  3:57 AM  Result Value Ref Range   WBC 5.5 4.0 - 10.5 K/uL   RBC 3.43 (L) 3.87 - 5.11 MIL/uL   Hemoglobin 8.8 (L) 12.0 - 15.0 g/dL   HCT 28.9 (L) 36.0 - 46.0 %   MCV 84.3 78.0 - 100.0 fL   MCH 25.7 (L) 26.0 - 34.0 pg   MCHC 30.4 30.0 - 36.0 g/dL   RDW 16.3 (H) 11.5 - 15.5 %   Platelets 160 150 - 400 K/uL  Troponin I     Status: None   Collection Time: 04/06/17  3:57 AM  Result Value Ref Range   Troponin I <0.03 <0.03 ng/mL  Glucose, capillary     Status: Abnormal   Collection Time: 04/06/17  7:26 AM  Result Value Ref Range   Glucose-Capillary 130 (H) 65 - 99 mg/dL   Comment 1 Notify RN    Comment 2 Document in Chart  Dg Chest Port 1 View  Result Date: 04/05/2017 CLINICAL DATA:  Patient with shortness of breath, worsening. Nonproductive cough for 1 week. EXAM: PORTABLE CHEST 1 VIEW COMPARISON:  Nonproductive FINDINGS: Monitoring leads overlie the patient. Enlarged cardiac and mediastinal contours. Pulmonary vascular redistribution and mild interstitial pulmonary opacities bilaterally. Possible small bilateral pleural effusions. IMPRESSION: Enlarged cardiac contours. Bilateral perihilar interstitial pulmonary opacities suggestive of mild interstitial edema. Possible small bilateral pleural effusions. Electronically Signed   By: Lovey Newcomer M.D.   On: 04/05/2017 19:01    Review of Systems  Constitutional: Negative for chills and fever.  HENT: Positive for congestion.   Respiratory: Positive for shortness of breath.   Cardiovascular: Positive for orthopnea and leg swelling. Negative for chest pain.  Gastrointestinal: Negative for abdominal pain,  diarrhea, nausea and vomiting.       Increased abdominal distention   Blood pressure (!) 157/87, pulse 76, temperature 98.5 F (36.9 C), temperature source Oral, resp. rate 18, height _0  (1.727 m), weight (!) 162.7 kg (358 lb 11.2 oz), SpO2 96 %. Physical Exam  Assessment/Plan: Problem #1 difficulty in breathing: Possibly a combination of fluid overload/COPD/sleep apnea. Presently she is on Lasix and feeling better. Patient still has significant edema. Problem #2 chronic renal failure: Stage V. Patient has this moment doesn't have any nausea or vomiting. She has right upper fistula which was put about 2 months ago. Etiology seems to be multifactorial including diabetes/hypertension/obesity related glomerulopathy Problem #3 anemia: Her hemoglobin is below our target goal and iron studies pending. Problem #4 bone and mineral disorder: Her calcium is in range but phosphorus is not available. Problem #5 history of obstructive sleep apnea: Patient is on CPAP Problem #6 morbid obesity Problem #7 history of diabetes Problem #8 history of anxiety disorder/bipolar Plan: We'll increase Lasix to 160 mg IV twice a day 2] we'll check her renal panel 3] presently patient doesn't require dialysis however if her creatinine continued to decline we may be able to consider.  Martha [name] 04/06/2017, 8:59 AM

## 2017-04-07 ENCOUNTER — Inpatient Hospital Stay (HOSPITAL_COMMUNITY): Payer: Medicaid Other

## 2017-04-07 DIAGNOSIS — R0600 Dyspnea, unspecified: Secondary | ICD-10-CM

## 2017-04-07 LAB — RENAL FUNCTION PANEL
Albumin: 3 g/dL — ABNORMAL LOW (ref 3.5–5.0)
Anion gap: 9 (ref 5–15)
BUN: 67 mg/dL — ABNORMAL HIGH (ref 6–20)
CHLORIDE: 113 mmol/L — AB (ref 101–111)
CO2: 19 mmol/L — ABNORMAL LOW (ref 22–32)
CREATININE: 5.34 mg/dL — AB (ref 0.44–1.00)
Calcium: 8.8 mg/dL — ABNORMAL LOW (ref 8.9–10.3)
GFR, EST AFRICAN AMERICAN: 10 mL/min — AB (ref >60)
GFR, EST NON AFRICAN AMERICAN: 9 mL/min — AB (ref >60)
Glucose, Bld: 126 mg/dL — ABNORMAL HIGH (ref 65–99)
PHOSPHORUS: 6.2 mg/dL — AB (ref 2.5–4.6)
Potassium: 4.9 mmol/L (ref 3.5–5.1)
Sodium: 141 mmol/L (ref 135–145)

## 2017-04-07 LAB — GLUCOSE, CAPILLARY
GLUCOSE-CAPILLARY: 154 mg/dL — AB (ref 65–99)
Glucose-Capillary: 122 mg/dL — ABNORMAL HIGH (ref 65–99)
Glucose-Capillary: 137 mg/dL — ABNORMAL HIGH (ref 65–99)
Glucose-Capillary: 147 mg/dL — ABNORMAL HIGH (ref 65–99)

## 2017-04-07 LAB — ECHOCARDIOGRAM COMPLETE
Height: 68 in
Weight: 5760.18 oz

## 2017-04-07 LAB — FOLATE RBC
FOLATE, HEMOLYSATE: 304.2 ng/mL
Folate, RBC: 1038 ng/mL (ref >498)
HEMATOCRIT: 29.3 % — AB (ref 34.0–46.6)

## 2017-04-07 MED ORDER — SODIUM CHLORIDE 0.9 % IV SOLN
510.0000 mg | INTRAVENOUS | Status: DC
  Administered 2017-04-07: 510 mg via INTRAVENOUS
  Filled 2017-04-07: qty 17

## 2017-04-07 NOTE — Progress Notes (Signed)
Subjective: She says she feels better. Her breathing is better. She has used CPAP overnight and found that to be very helpful. Nephrology consultation noted and appreciated. She does not need dialysis now. Her intake and output is incomplete. She has no other new complaints  Objective: Vital signs in last 24 hours: Temp:  [97.4 F (36.3 C)-98 F (36.7 C)] 97.4 F (36.3 C) (09/11 0532) Pulse Rate:  [66-70] 70 (09/11 0532) Resp:  [16-18] 16 (09/11 0532) BP: (129-159)/(67-83) 154/83 (09/11 0532) SpO2:  [95 %-98 %] 98 % (09/11 0532) Weight:  [163.3 kg (360 lb 0.2 oz)] 163.3 kg (360 lb 0.2 oz) (09/11 0601) Weight change: 11.3 kg (25 lb 0.2 oz) Last BM Date: 04/04/17  Intake/Output from previous day: 09/10 0701 - 09/11 0700 In: 836 [P.O.:720; IV Piggyback:116] Out: 800 [Urine:800]  PHYSICAL EXAM General appearance: alert, cooperative and mild distress Resp: rales bilaterally Cardio: regular rate and rhythm, S1, S2 normal, no murmur, click, rub or gallop GI: soft, non-tender; bowel sounds normal; no masses,  no organomegaly Extremities: She still has significant lower leg edema but it is less than yesterday Mildly anxious. Skin warm and dry  Lab Results:  Results for orders placed or performed during the hospital encounter of 04/05/17 (from the past 48 hour(s))  CBC with Differential/Platelet     Status: Abnormal   Collection Time: 04/05/17  5:38 PM  Result Value Ref Range   WBC 6.0 4.0 - 10.5 K/uL   RBC 3.75 (L) 3.87 - 5.11 MIL/uL   Hemoglobin 9.7 (L) 12.0 - 15.0 g/dL   HCT 31.9 (L) 36.0 - 46.0 %   MCV 85.1 78.0 - 100.0 fL   MCH 25.9 (L) 26.0 - 34.0 pg   MCHC 30.4 30.0 - 36.0 g/dL   RDW 16.5 (H) 11.5 - 15.5 %   Platelets 182 150 - 400 K/uL   Neutrophils Relative % 69 %   Neutro Abs 4.2 1.7 - 7.7 K/uL   Lymphocytes Relative 23 %   Lymphs Abs 1.4 0.7 - 4.0 K/uL   Monocytes Relative 5 %   Monocytes Absolute 0.3 0.1 - 1.0 K/uL   Eosinophils Relative 3 %   Eosinophils  Absolute 0.2 0.0 - 0.7 K/uL   Basophils Relative 0 %   Basophils Absolute 0.0 0.0 - 0.1 K/uL  Comprehensive metabolic panel     Status: Abnormal   Collection Time: 04/05/17  5:38 PM  Result Value Ref Range   Sodium 142 135 - 145 mmol/L   Potassium 5.1 3.5 - 5.1 mmol/L   Chloride 113 (H) 101 - 111 mmol/L   CO2 19 (L) 22 - 32 mmol/L   Glucose, Bld 172 (H) 65 - 99 mg/dL   BUN 64 (H) 6 - 20 mg/dL   Creatinine, Ser 4.92 (H) 0.44 - 1.00 mg/dL   Calcium 9.0 8.9 - 10.3 mg/dL   Total Protein 6.5 6.5 - 8.1 g/dL   Albumin 3.5 3.5 - 5.0 g/dL   AST 13 (L) 15 - 41 U/L   ALT 15 14 - 54 U/L   Alkaline Phosphatase 74 38 - 126 U/L   Total Bilirubin 0.4 0.3 - 1.2 mg/dL   GFR calc non Af Amer 9 (L) >60 mL/min   GFR calc Af Amer 11 (L) >60 mL/min    Comment: (NOTE) The eGFR has been calculated using the CKD EPI equation. This calculation has not been validated in all clinical situations. eGFR's persistently <60 mL/min signify possible Chronic Kidney Disease.  Anion gap 10 5 - 15  Brain natriuretic peptide     Status: Abnormal   Collection Time: 04/05/17  5:38 PM  Result Value Ref Range   B Natriuretic Peptide 820.0 (H) 0.0 - 100.0 pg/mL  Lipase, blood     Status: None   Collection Time: 04/05/17  5:38 PM  Result Value Ref Range   Lipase 27 11 - 51 U/L  Troponin I     Status: None   Collection Time: 04/05/17  5:38 PM  Result Value Ref Range   Troponin I <0.03 <0.03 ng/mL  TSH     Status: None   Collection Time: 04/05/17  5:38 PM  Result Value Ref Range   TSH 3.544 0.350 - 4.500 uIU/mL    Comment: Performed by a 3rd Generation assay with a functional sensitivity of <=0.01 uIU/mL.  Urinalysis, Routine w reflex microscopic     Status: Abnormal   Collection Time: 04/05/17  7:21 PM  Result Value Ref Range   Color, Urine YELLOW YELLOW   APPearance HAZY (A) CLEAR   Specific Gravity, Urine 1.011 1.005 - 1.030   pH 5.0 5.0 - 8.0   Glucose, UA 150 (A) NEGATIVE mg/dL   Hgb urine dipstick SMALL  (A) NEGATIVE   Bilirubin Urine NEGATIVE NEGATIVE   Ketones, ur NEGATIVE NEGATIVE mg/dL   Protein, ur >=300 (A) NEGATIVE mg/dL   Nitrite POSITIVE (A) NEGATIVE   Leukocytes, UA SMALL (A) NEGATIVE   RBC / HPF 0-5 0 - 5 RBC/hpf   WBC, UA TOO NUMEROUS TO COUNT 0 - 5 WBC/hpf   Bacteria, UA MANY (A) NONE SEEN   Squamous Epithelial / LPF 0-5 (A) NONE SEEN   WBC Clumps PRESENT    Mucus PRESENT   Urine culture     Status: Abnormal (Preliminary result)   Collection Time: 04/05/17  7:21 PM  Result Value Ref Range   Specimen Description URINE, RANDOM    Special Requests NONE    Culture >=100,000 COLONIES/mL GRAM NEGATIVE RODS (A)    Report Status PENDING   Ferritin     Status: None   Collection Time: 04/05/17 10:23 PM  Result Value Ref Range   Ferritin 64 11 - 307 ng/mL    Comment: Performed at [facility], [place] [address], [place], [place] [address]  Iron and TIBC     Status: Abnormal   Collection Time: 04/05/17 10:23 PM  Result Value Ref Range   Iron 29 28 - 170 ug/dL   TIBC 284 250 - 450 ug/dL   Saturation Ratios 10 (L) 10.4 - 31.8 %   UIBC 255 ug/dL    Comment: Performed at [facility], [place] [address] [place], [place] [address]  Vitamin B12     Status: None   Collection Time: 04/05/17 10:23 PM  Result Value Ref Range   Vitamin B-12 405 180 - 914 pg/mL    Comment: (NOTE) This assay is not validated for testing neonatal or myeloproliferative syndrome specimens for Vitamin B12 levels. Performed at [facility], [place] [address] [place], [place] [address]   Troponin I     Status: None   Collection Time: 04/05/17 10:23 PM  Result Value Ref Range   Troponin I <0.03 <0.03 ng/mL  Sedimentation rate     Status: None   Collection Time: 04/06/17  3:57 AM  Result Value Ref Range   Sed Rate 3 0 - 22 mm/hr  Comprehensive metabolic panel  Status: Abnormal   Collection Time: 04/06/17  3:57 AM  Result Value Ref Range   Sodium 141 135 - 145 mmol/L   Potassium 4.8 3.5  - 5.1 mmol/L   Chloride 113 (H) 101 - 111 mmol/L   CO2 18 (L) 22 - 32 mmol/L   Glucose, Bld 135 (H) 65 - 99 mg/dL   BUN 63 (H) 6 - 20 mg/dL   Creatinine, Ser 5.08 (H) 0.44 - 1.00 mg/dL   Calcium 8.7 (L) 8.9 - 10.3 mg/dL   Total Protein 6.0 (L) 6.5 - 8.1 g/dL   Albumin 3.1 (L) 3.5 - 5.0 g/dL   AST 9 (L) 15 - 41 U/L   ALT 14 14 - 54 U/L   Alkaline Phosphatase 63 38 - 126 U/L   Total Bilirubin 0.5 0.3 - 1.2 mg/dL   GFR calc non Af Amer 9 (L) >60 mL/min   GFR calc Af Amer 11 (L) >60 mL/min    Comment: (NOTE) The eGFR has been calculated using the CKD EPI equation. This calculation has not been validated in all clinical situations. eGFR's persistently <60 mL/min signify possible Chronic Kidney Disease.    Anion gap 10 5 - 15  CBC     Status: Abnormal   Collection Time: 04/06/17  3:57 AM  Result Value Ref Range   WBC 5.5 4.0 - 10.5 K/uL   RBC 3.43 (L) 3.87 - 5.11 MIL/uL   Hemoglobin 8.8 (L) 12.0 - 15.0 g/dL   HCT 28.9 (L) 36.0 - 46.0 %   MCV 84.3 78.0 - 100.0 fL   MCH 25.7 (L) 26.0 - 34.0 pg   MCHC 30.4 30.0 - 36.0 g/dL   RDW 16.3 (H) 11.5 - 15.5 %   Platelets 160 150 - 400 K/uL  Troponin I     Status: None   Collection Time: 04/06/17  3:57 AM  Result Value Ref Range   Troponin I <0.03 <0.03 ng/mL  Glucose, capillary     Status: Abnormal   Collection Time: 04/06/17  7:26 AM  Result Value Ref Range   Glucose-Capillary 130 (H) 65 - 99 mg/dL   Comment 1 Notify RN    Comment 2 Document in Chart   Troponin I     Status: None   Collection Time: 04/06/17 10:02 AM  Result Value Ref Range   Troponin I <0.03 <0.03 ng/mL  Glucose, capillary     Status: Abnormal   Collection Time: 04/06/17 11:06 AM  Result Value Ref Range   Glucose-Capillary 139 (H) 65 - 99 mg/dL   Comment 1 Notify RN    Comment 2 Document in Chart   Glucose, capillary     Status: Abnormal   Collection Time: 04/06/17  4:13 PM  Result Value Ref Range   Glucose-Capillary 123 (H) 65 - 99 mg/dL   Comment 1 Notify  RN    Comment 2 Document in Chart   Glucose, capillary     Status: Abnormal   Collection Time: 04/06/17  8:48 PM  Result Value Ref Range   Glucose-Capillary 153 (H) 65 - 99 mg/dL  Renal function panel     Status: Abnormal   Collection Time: 04/07/17  5:08 AM  Result Value Ref Range   Sodium 141 135 - 145 mmol/L   Potassium 4.9 3.5 - 5.1 mmol/L   Chloride 113 (H) 101 - 111 mmol/L   CO2 19 (L) 22 - 32 mmol/L   Glucose, Bld 126 (H) 65 - 99 mg/dL   BUN  67 (H) 6 - 20 mg/dL   Creatinine, Ser 5.34 (H) 0.44 - 1.00 mg/dL   Calcium 8.8 (L) 8.9 - 10.3 mg/dL   Phosphorus 6.2 (H) 2.5 - 4.6 mg/dL   Albumin 3.0 (L) 3.5 - 5.0 g/dL   GFR calc non Af Amer 9 (L) >60 mL/min   GFR calc Af Amer 10 (L) >60 mL/min    Comment: (NOTE) The eGFR has been calculated using the CKD EPI equation. This calculation has not been validated in all clinical situations. eGFR's persistently <60 mL/min signify possible Chronic Kidney Disease.    Anion gap 9 5 - 15  Glucose, capillary     Status: Abnormal   Collection Time: 04/07/17  7:22 AM  Result Value Ref Range   Glucose-Capillary 122 (H) 65 - 99 mg/dL   Comment 1 Notify RN    Comment 2 Document in Chart     ABGS No results for input(s): PHART, PO2ART, TCO2, HCO3 in the last 72 hours.  Invalid input(s): PCO2 CULTURES Recent Results (from the past 240 hour(s))  Urine culture     Status: Abnormal (Preliminary result)   Collection Time: 04/05/17  7:21 PM  Result Value Ref Range Status   Specimen Description URINE, RANDOM  Final   Special Requests NONE  Final   Culture >=100,000 COLONIES/mL GRAM NEGATIVE RODS (A)  Final   Report Status PENDING  Incomplete   Studies/Results: Dg Chest Port 1 View  Result Date: 04/05/2017 CLINICAL DATA:  Patient with shortness of breath, worsening. Nonproductive cough for 1 week. EXAM: PORTABLE CHEST 1 VIEW COMPARISON:  Nonproductive FINDINGS: Monitoring leads overlie the patient. Enlarged cardiac and mediastinal contours.  Pulmonary vascular redistribution and mild interstitial pulmonary opacities bilaterally. Possible small bilateral pleural effusions. IMPRESSION: Enlarged cardiac contours. Bilateral perihilar interstitial pulmonary opacities suggestive of mild interstitial edema. Possible small bilateral pleural effusions. Electronically Signed   By: Lovey Newcomer M.D.   On: 04/05/2017 19:01    Medications:  Prior to Admission:  Prescriptions Prior to Admission  Medication Sig Dispense Refill Last Dose  . albuterol (PROVENTIL HFA;VENTOLIN HFA) 108 (90 Base) MCG/ACT inhaler Inhale 2 puffs into the lungs every 4 (four) hours as needed for wheezing or shortness of breath. 1 Inhaler 0 04/05/2017 at 1500  . ALPRAZolam (XANAX) 1 MG tablet Take 1 mg by mouth 4 (four) times daily as needed for anxiety (nerves).   04/05/2017 at 1500  . atorvastatin (LIPITOR) 10 MG tablet Take 10 mg by mouth daily. Take along with 20 mg tablet to equal 30 mg daily.   04/04/2017 at 2100  . atorvastatin (LIPITOR) 20 MG tablet Take 20 mg by mouth daily. With Atorvastatin 10 mg to equal 30 mg total daily   04/04/2017 at 2100  . carvedilol (COREG) 12.5 MG tablet Take 1 tablet (12.5 mg total) by mouth 2 (two) times daily with a meal. 60 tablet 0 04/04/2017 at 2100  . colchicine 0.6 MG tablet Take 1 tablet (0.6 mg total) by mouth every other day. 16 tablet 12 04/04/2017 at Unknown time  . fluticasone (FLONASE) 50 MCG/ACT nasal spray Place 2 sprays into both nostrils at bedtime.   Past Month at Unknown time  . hydrALAZINE (APRESOLINE) 25 MG tablet Take 1 tablet (25 mg total) by mouth every 8 (eight) hours. 90 tablet 12 04/04/2017 at 2100  . HYDROcodone-acetaminophen (NORCO) 10-325 MG tablet Take 1 tablet by mouth every 6 (six) hours as needed for moderate pain. 6 tablet 0 04/05/2017 at 1500  .  lisinopril-hydrochlorothiazide (PRINZIDE,ZESTORETIC) 20-12.5 MG tablet Take 1 tablet by mouth daily.   04/04/2017 at 2100  . loperamide (IMODIUM) 2 MG capsule Take 1 capsule (2 mg  total) by mouth 4 (four) times daily as needed for diarrhea or loose stools. 12 capsule 0 Past Month at Unknown time  . PARoxetine (PAXIL) 20 MG tablet Take 1 daily. This is to prevent panic attacks (Patient taking differently: Take 20-40 mg by mouth daily. Take 1 daily. This is to prevent panic attacks) 30 tablet 12 04/04/2017 at Unknown time  . risperiDONE (RISPERDAL) 0.5 MG tablet Take 1 tablet (0.5 mg total) by mouth 2 (two) times daily.   04/04/2017 at 2100  . rOPINIRole (REQUIP) 1 MG tablet Take 1 mg by mouth at bedtime.    04/04/2017 at 2100  . torsemide (DEMADEX) 20 MG tablet Take 1 tablet (20 mg total) by mouth daily.   04/04/2017 at 2100   Scheduled: . atorvastatin  30 mg Oral Daily  . carvedilol  25 mg Oral BID WC  . colchicine  0.6 mg Oral Q48H  . fluticasone  2 spray Each Nare QHS  . heparin  5,000 Units Subcutaneous Q8H  . hydrALAZINE  50 mg Oral Q8H  . insulin aspart  0-5 Units Subcutaneous QHS  . insulin aspart  0-9 Units Subcutaneous TID WC  . PARoxetine  20 mg Oral Daily  . risperiDONE  0.5 mg Oral BID  . rOPINIRole  1 mg Oral QHS  . sodium chloride flush  3 mL Intravenous Q12H   Continuous: . sodium chloride    . cefTRIAXone (ROCEPHIN)  IV Stopped (04/06/17 2341)  . furosemide Stopped (04/06/17 1805)   GYK:ZLDJTT chloride, acetaminophen **OR** acetaminophen, albuterol, ALPRAZolam, HYDROcodone-acetaminophen, sodium chloride flush  Assesment: She was admitted with pulmonary edema. Echocardiogram is pending. She has chronic renal failure and it's felt that her pulmonary edema is probably from volume overload from that. She has acute on chronic renal failure but not a level that she needs dialysis at this point at least as of yesterday. Her breathing is better. I and O is incomplete so I'm not sure how much she has diuresed but she does look better and has less edema. She has anemia of chronic disease and has iron studies pending. Her blood sugar has done pretty well Principal  Problem:   CHF (congestive heart failure) (HCC) Active Problems:   Essential hypertension   GERD   Anemia   Diabetes mellitus, type 2 (HCC)   Acute on chronic renal failure (HCC)   Chest pain    Plan: Continue current treatments    LOS: 2 days   Victorina Kable L 04/07/2017, 8:28 AM

## 2017-04-07 NOTE — Progress Notes (Signed)
*  PRELIMINARY RESULTS* Echocardiogram 2D Echocardiogram has been performed.  Leavy Cella 04/07/2017, 11:48 AM

## 2017-04-07 NOTE — Progress Notes (Signed)
Subjective: Interval History: has no complaint of nausea or vomiting. Patient also denies any difficulty in breathing. Overall she feels better..  Objective: Vital signs in last 24 hours: Temp:  [97.4 F (36.3 C)-98 F (36.7 C)] 97.4 F (36.3 C) (09/11 0532) Pulse Rate:  [66-70] 70 (09/11 0532) Resp:  [16-18] 16 (09/11 0532) BP: (129-159)/(67-83) 154/83 (09/11 0532) SpO2:  [95 %-98 %] 98 % (09/11 0532) Weight:  [163.3 kg (360 lb 0.2 oz)] 163.3 kg (360 lb 0.2 oz) (09/11 0601) Weight change: 11.3 kg (25 lb 0.2 oz)  Intake/Output from previous day: 09/10 0701 - 09/11 0700 In: 836 [P.O.:720; IV Piggyback:116] Out: 800 [Urine:800] Intake/Output this shift: No intake/output data recorded.  General appearance: alert, cooperative and no distress Resp: diminished breath sounds bilaterally Cardio: regular rate and rhythm Extremities: edema She has 2+ edema  Lab Results:  Recent Labs  04/05/17 1738 04/06/17 0357  WBC 6.0 5.5  HGB 9.7* 8.8*  HCT 31.9* 28.9*  PLT 182 160   BMET:  Recent Labs  04/06/17 0357 04/07/17 0508  NA 141 141  K 4.8 4.9  CL 113* 113*  CO2 18* 19*  GLUCOSE 135* 126*  BUN 63* 67*  CREATININE 5.08* 5.34*  CALCIUM 8.7* 8.8*   No results for input(s): PTH in the last 72 hours. Iron Studies:  Recent Labs  04/05/17 2223  IRON 29  TIBC 284  FERRITIN 64    Studies/Results: Dg Chest Port 1 View  Result Date: 04/05/2017 CLINICAL DATA:  Patient with shortness of breath, worsening. Nonproductive cough for 1 week. EXAM: PORTABLE CHEST 1 VIEW COMPARISON:  Nonproductive FINDINGS: Monitoring leads overlie the patient. Enlarged cardiac and mediastinal contours. Pulmonary vascular redistribution and mild interstitial pulmonary opacities bilaterally. Possible small bilateral pleural effusions. IMPRESSION: Enlarged cardiac contours. Bilateral perihilar interstitial pulmonary opacities suggestive of mild interstitial edema. Possible small bilateral pleural  effusions. Electronically Signed   By: Lovey Newcomer M.D.   On: 04/05/2017 19:01    I have reviewed the patient's current medications.  Assessment/Plan: Problem #1 difficulty breathing: Possibly a combination of COPD/sleep apnea/CHF. Presently she is on Lasix. She had about 800 mL of urine output and feels better. Patient however still with significant fluid overload. Problem #2renal failure: Chronic. Presently her creatinine is increasing most likely from fluid removal. Patient however does not have uremic sign and symptoms.  Problem #3 anemia: Her hemoglobin is below target goal. Presently her iron saturation is 10%. Ferritin 64 hence iron deficiency anemia. Problem #4 Bone and  mineral disorder: Her calcium is range but her phosphorus is high. Patient presently is not on a binder. Problem #5 history of hypertension: Her blood pressure is reasonably controlled Problem #6 history of diabetes Problem #7 history of atrial fibrillation: Her heart rate is controlled Problem #8 history of sleep apnea once CPAP Plan: 1]We'll give her IV iron today 2] continue his diuretics 3] will check her renal panel in the morning 4] Patient does not need dialysis    LOS: 2 days   [facility] S 04/07/2017,8:32 AM

## 2017-04-08 LAB — URINE CULTURE

## 2017-04-08 LAB — CBC
HEMATOCRIT: 28.8 % — AB (ref 36.0–46.0)
HEMOGLOBIN: 8.6 g/dL — AB (ref 12.0–15.0)
MCH: 25.3 pg — ABNORMAL LOW (ref 26.0–34.0)
MCHC: 29.9 g/dL — ABNORMAL LOW (ref 30.0–36.0)
MCV: 84.7 fL (ref 78.0–100.0)
Platelets: 143 10*3/uL — ABNORMAL LOW (ref 150–400)
RBC: 3.4 MIL/uL — AB (ref 3.87–5.11)
RDW: 16.7 % — ABNORMAL HIGH (ref 11.5–15.5)
WBC: 4 10*3/uL (ref 4.0–10.5)

## 2017-04-08 LAB — RENAL FUNCTION PANEL
Albumin: 3.2 g/dL — ABNORMAL LOW (ref 3.5–5.0)
Anion gap: 9 (ref 5–15)
BUN: 68 mg/dL — ABNORMAL HIGH (ref 6–20)
CHLORIDE: 112 mmol/L — AB (ref 101–111)
CO2: 20 mmol/L — ABNORMAL LOW (ref 22–32)
Calcium: 8.9 mg/dL (ref 8.9–10.3)
Creatinine, Ser: 5.53 mg/dL — ABNORMAL HIGH (ref 0.44–1.00)
GFR, EST AFRICAN AMERICAN: 10 mL/min — AB (ref >60)
GFR, EST NON AFRICAN AMERICAN: 8 mL/min — AB (ref >60)
Glucose, Bld: 119 mg/dL — ABNORMAL HIGH (ref 65–99)
POTASSIUM: 4.7 mmol/L (ref 3.5–5.1)
Phosphorus: 6.4 mg/dL — ABNORMAL HIGH (ref 2.5–4.6)
Sodium: 141 mmol/L (ref 135–145)

## 2017-04-08 LAB — GLUCOSE, CAPILLARY
GLUCOSE-CAPILLARY: 131 mg/dL — AB (ref 65–99)
GLUCOSE-CAPILLARY: 88 mg/dL (ref 65–99)
GLUCOSE-CAPILLARY: 153 mg/dL — AB (ref 65–99)
Glucose-Capillary: 121 mg/dL — ABNORMAL HIGH (ref 65–99)

## 2017-04-08 NOTE — Progress Notes (Signed)
Martha [name]  MRN: [id number]  DOB/AGE: [birth year] 49 y.o.  Primary Care Physician:Hawkins, Percell Miller, MD  Admit date: 04/05/2017  Chief Complaint:  Chief Complaint  Patient presents with  . Shortness of Breath    S-Pt presented on  04/05/2017 with  Chief Complaint  Patient presents with  . Shortness of Breath  .    Pt does not offer any specific complaints.    Meds  . atorvastatin  30 mg Oral Daily  . carvedilol  25 mg Oral BID WC  . colchicine  0.6 mg Oral Q48H  . fluticasone  2 spray Each Nare QHS  . heparin  5,000 Units Subcutaneous Q8H  . hydrALAZINE  50 mg Oral Q8H  . insulin aspart  0-5 Units Subcutaneous QHS  . insulin aspart  0-9 Units Subcutaneous TID WC  . PARoxetine  20 mg Oral Daily  . risperiDONE  0.5 mg Oral BID  . rOPINIRole  1 mg Oral QHS  . sodium chloride flush  3 mL Intravenous Q12H          Physical Exam: Vital signs in last 24 hours: Temp:  [97.5 F (36.4 C)-98 F (36.7 C)] 97.6 F (36.4 C) (09/12 0540) Pulse Rate:  [68-71] 71 (09/12 0540) Resp:  [18-20] 20 (09/12 0540) BP: (106-146)/(60-84) 127/60 (09/12 0540) SpO2:  [95 %-97 %] 96 % (09/12 0540) Weight:  [355 lb 1.6 oz (161.1 kg)] 355 lb 1.6 oz (161.1 kg) (09/12 0658) Weight change: -4 lb 14.6 oz (-2.228 kg) Last BM Date: 04/06/17  Intake/Output from previous day: 09/11 0701 - 09/12 0700 In: 903 [P.O.:720; IV Piggyback:183] Out: 1300 [Urine:1300] No intake/output data recorded.   Physical Exam: General- pt is awake but not oriented. Resp- No acute REsp distress, NO Rhonchi CVS- S1S2 regular in rate and rhythm GIT- BS+, soft, NT, ND EXT- NO LE Edema, No Cyanosis   Lab Results: CBC  Recent Labs  04/06/17 0357 04/08/17 0418  WBC 5.5 4.0  HGB 8.8* 8.6*  HCT 28.9*  29.3* 28.8*  PLT 160 143*    BMET  Recent Labs  04/07/17 0508 04/08/17 0418  NA 141 141  K 4.9 4.7  CL 113* 112*  CO2 19* 20*  GLUCOSE 126* 119*  BUN 67* 68*  CREATININE 5.34* 5.53*  CALCIUM  8.8* 8.9   Creat trend 2018 4.9=> 5.5       4.1--5.2( baseline)       6.7=>4.8=>4.5( april admission)  2017  2.3--4.7 2016 1.9--2.2 2015 1.3--3.4   Lab Results  Component Value Date   PTH 61 11/16/2016   PTH Comment 11/16/2016   CALCIUM 8.9 04/08/2017   CAION 1.15 06/21/2015   PHOS 6.4 (H) 04/08/2017               Impression: 1)Renal  CKD stage 5.               CKD since 2015               CKD secondary to  DM                Progression of CKD marked with multiple episodes of AKI                Proteinura present.                               2)HTN  Medication-  On Diuretics- On Alpha and beta  Blockers  On Vasodilators-    3)Anemia HGb stable  4)CKD Mineral-Bone Disorder PTH acceptable  Secondary Hyperparathyroidism absent. Phosphorus not at goal.   5)DM PMD following  6)Electrolytes Normokalemic NOrmonatremic   7)Acid base Co2 near to goal Acidosis now better  8) Dyspnea  CHF/COPD?Sleep apnea    admitted with fluid overload    Now better.  Plan:  Will continue current care.       Klark Vanderhoef S 04/08/2017, 9:32 AM

## 2017-04-08 NOTE — Plan of Care (Signed)
Problem: Food- and Nutrition-Related Knowledge Deficit (NB-1.1) Goal: Nutrition education Formal process to instruct or train a patient/client in a skill or to impart knowledge to help patients/clients voluntarily manage or modify food choices and eating behavior to maintain or improve health. Outcome: Completed/Met Date Met: 04/08/17 Nutrition Education Note  RD consulted for clarification of prior diet education.   RD provided CHF diet education on 04/06/17  Provided Renal Pyramid handout to patient. Reviewed food groups and provided written recommended serving sizes specifically determined for patient's current nutritional status.   Explained why diet restrictions are needed and provided lists of foods to limit/avoid that are high potassium, sodium, and phosphorus. Provided specific recommendations on safer alternatives of these foods. Strongly encouraged compliance of this diet.   Discussed importance of protein intake at each meal and snack. Provided examples of how to maximize protein intake throughout the day. Discussed need for fluid restriction (1.2 L/d) and renal-friendly beverage options. Pt report consuming a lot of lemon water, occasionally add Splenda. Pt reports use of splenda regularly and inquired about continuing.   Pt has history of diabetes, and inquired about dietary changes for this. Per chart review pt was educated on diabetic diet 2 years ago, pt could not recall. Of note, A1C 04/18 was 5.9.  Emphasized the consumption of lean proteins as this coincides with all diet regulations. Discussed proper preparation methods per diet requirements.   Pt reports only consuming 1 meal/d currently. RD encouraged regular meal patterns to assist in regulating intake, blood sugar, and hunger.  Teach back method used.  Expect poor compliance. Pt was very confused and did not remember much from prior education.  Pt states "I'll go outside and eat some grass, this is what it sounds like  I can consume"  Body mass index is 53.99 kg/m. Pt meets criteria for morbid obesity based on current BMI.  Additional diet questions were addressed and answered.  If additional nutrition issues arise, please re-consult RD.  Parks Ranger, MS, RDN, LDN 04/08/2017 1:59 PM

## 2017-04-08 NOTE — Progress Notes (Signed)
Subjective: She says she feels better but she is still pretty short of breath even at rest. No other new complaints. No chest pain. No nausea or vomiting. No confusion.  Objective: Vital signs in last 24 hours: Temp:  [97.5 F (36.4 C)-98 F (36.7 C)] 97.6 F (36.4 C) (09/12 0540) Pulse Rate:  [68-71] 71 (09/12 0540) Resp:  [18-20] 20 (09/12 0540) BP: (106-146)/(60-84) 127/60 (09/12 0540) SpO2:  [95 %-97 %] 96 % (09/12 0540) Weight:  [161.1 kg (355 lb 1.6 oz)] 161.1 kg (355 lb 1.6 oz) (09/12 0658) Weight change: -2.228 kg (-4 lb 14.6 oz) Last BM Date: 04/06/17  Intake/Output from previous day: 09/11 0701 - 09/12 0700 In: 903 [P.O.:720; IV Piggyback:183] Out: 1300 [Urine:1300]  PHYSICAL EXAM General appearance: alert, cooperative and mild distress Resp: rales bilaterally Cardio: regular rate and rhythm, S1, S2 normal, no murmur, click, rub or gallop GI: soft, non-tender; bowel sounds normal; no masses,  no organomegaly Extremities: 2+ edema still Skin warm and dry. Her fistula has good thrill  Lab Results:  Results for orders placed or performed during the hospital encounter of 04/05/17 (from the past 48 hour(s))  Troponin I     Status: None   Collection Time: 04/06/17 10:02 AM  Result Value Ref Range   Troponin I <0.03 <0.03 ng/mL  Glucose, capillary     Status: Abnormal   Collection Time: 04/06/17 11:06 AM  Result Value Ref Range   Glucose-Capillary 139 (H) 65 - 99 mg/dL   Comment 1 Notify RN    Comment 2 Document in Chart   Glucose, capillary     Status: Abnormal   Collection Time: 04/06/17  4:13 PM  Result Value Ref Range   Glucose-Capillary 123 (H) 65 - 99 mg/dL   Comment 1 Notify RN    Comment 2 Document in Chart   Glucose, capillary     Status: Abnormal   Collection Time: 04/06/17  8:48 PM  Result Value Ref Range   Glucose-Capillary 153 (H) 65 - 99 mg/dL  Renal function panel     Status: Abnormal   Collection Time: 04/07/17  5:08 AM  Result Value Ref Range    Sodium 141 135 - 145 mmol/L   Potassium 4.9 3.5 - 5.1 mmol/L   Chloride 113 (H) 101 - 111 mmol/L   CO2 19 (L) 22 - 32 mmol/L   Glucose, Bld 126 (H) 65 - 99 mg/dL   BUN 67 (H) 6 - 20 mg/dL   Creatinine, Ser 1.02 (H) 0.44 - 1.00 mg/dL   Calcium 8.8 (L) 8.9 - 10.3 mg/dL   Phosphorus 6.2 (H) 2.5 - 4.6 mg/dL   Albumin 3.0 (L) 3.5 - 5.0 g/dL   GFR calc non Af Amer 9 (L) >60 mL/min   GFR calc Af Amer 10 (L) >60 mL/min    Comment: (NOTE) The eGFR has been calculated using the CKD EPI equation. This calculation has not been validated in all clinical situations. eGFR's persistently <60 mL/min signify possible Chronic Kidney Disease.    Anion gap 9 5 - 15  Glucose, capillary     Status: Abnormal   Collection Time: 04/07/17  7:22 AM  Result Value Ref Range   Glucose-Capillary 122 (H) 65 - 99 mg/dL   Comment 1 Notify RN    Comment 2 Document in Chart   Glucose, capillary     Status: Abnormal   Collection Time: 04/07/17 10:56 AM  Result Value Ref Range   Glucose-Capillary 154 (H) 65 -  99 mg/dL   Comment 1 Notify RN    Comment 2 Document in Chart   Glucose, capillary     Status: Abnormal   Collection Time: 04/07/17  3:58 PM  Result Value Ref Range   Glucose-Capillary 137 (H) 65 - 99 mg/dL   Comment 1 Notify RN    Comment 2 Document in Chart   Glucose, capillary     Status: Abnormal   Collection Time: 04/07/17  8:38 PM  Result Value Ref Range   Glucose-Capillary 147 (H) 65 - 99 mg/dL   Comment 1 Notify RN    Comment 2 Document in Chart   CBC     Status: Abnormal   Collection Time: 04/08/17  4:18 AM  Result Value Ref Range   WBC 4.0 4.0 - 10.5 K/uL   RBC 3.40 (L) 3.87 - 5.11 MIL/uL   Hemoglobin 8.6 (L) 12.0 - 15.0 g/dL   HCT 28.8 (L) 36.0 - 46.0 %   MCV 84.7 78.0 - 100.0 fL   MCH 25.3 (L) 26.0 - 34.0 pg   MCHC 29.9 (L) 30.0 - 36.0 g/dL   RDW 16.7 (H) 11.5 - 15.5 %   Platelets 143 (L) 150 - 400 K/uL  Renal function panel     Status: Abnormal   Collection Time: 04/08/17  4:18  AM  Result Value Ref Range   Sodium 141 135 - 145 mmol/L   Potassium 4.7 3.5 - 5.1 mmol/L   Chloride 112 (H) 101 - 111 mmol/L   CO2 20 (L) 22 - 32 mmol/L   Glucose, Bld 119 (H) 65 - 99 mg/dL   BUN 68 (H) 6 - 20 mg/dL   Creatinine, Ser 5.53 (H) 0.44 - 1.00 mg/dL   Calcium 8.9 8.9 - 10.3 mg/dL   Phosphorus 6.4 (H) 2.5 - 4.6 mg/dL   Albumin 3.2 (L) 3.5 - 5.0 g/dL   GFR calc non Af Amer 8 (L) >60 mL/min   GFR calc Af Amer 10 (L) >60 mL/min    Comment: (NOTE) The eGFR has been calculated using the CKD EPI equation. This calculation has not been validated in all clinical situations. eGFR's persistently <60 mL/min signify possible Chronic Kidney Disease.    Anion gap 9 5 - 15  Glucose, capillary     Status: Abnormal   Collection Time: 04/08/17  7:35 AM  Result Value Ref Range   Glucose-Capillary 131 (H) 65 - 99 mg/dL   Comment 1 Notify RN    Comment 2 Document in Chart     ABGS No results for input(s): PHART, PO2ART, TCO2, HCO3 in the last 72 hours.  Invalid input(s): PCO2 CULTURES Recent Results (from the past 240 hour(s))  Urine culture     Status: Abnormal   Collection Time: 04/05/17  7:21 PM  Result Value Ref Range Status   Specimen Description URINE, RANDOM  Final   Special Requests NONE  Final   Culture >=100,000 COLONIES/mL ENTEROBACTER AEROGENES (A)  Final   Report Status 04/08/2017 FINAL  Final   Organism ID, Bacteria ENTEROBACTER AEROGENES (A)  Final      Susceptibility   Enterobacter aerogenes - MIC*    CEFAZOLIN RESISTANT Resistant     CEFTRIAXONE <=1 SENSITIVE Sensitive     CIPROFLOXACIN <=0.25 SENSITIVE Sensitive     GENTAMICIN <=1 SENSITIVE Sensitive     IMIPENEM <=0.25 SENSITIVE Sensitive     NITROFURANTOIN 64 INTERMEDIATE Intermediate     TRIMETH/SULFA <=20 SENSITIVE Sensitive     PIP/TAZO <=4 SENSITIVE  Sensitive     * >=100,000 COLONIES/mL ENTEROBACTER AEROGENES   Studies/Results: No results found.  Medications:  Prior to Admission:   Prescriptions Prior to Admission  Medication Sig Dispense Refill Last Dose  . albuterol (PROVENTIL HFA;VENTOLIN HFA) 108 (90 Base) MCG/ACT inhaler Inhale 2 puffs into the lungs every 4 (four) hours as needed for wheezing or shortness of breath. 1 Inhaler 0 04/05/2017 at 1500  . ALPRAZolam (XANAX) 1 MG tablet Take 1 mg by mouth 4 (four) times daily as needed for anxiety (nerves).   04/05/2017 at 1500  . atorvastatin (LIPITOR) 10 MG tablet Take 10 mg by mouth daily. Take along with 20 mg tablet to equal 30 mg daily.   04/04/2017 at 2100  . atorvastatin (LIPITOR) 20 MG tablet Take 20 mg by mouth daily. With Atorvastatin 10 mg to equal 30 mg total daily   04/04/2017 at 2100  . carvedilol (COREG) 12.5 MG tablet Take 1 tablet (12.5 mg total) by mouth 2 (two) times daily with a meal. 60 tablet 0 04/04/2017 at 2100  . colchicine 0.6 MG tablet Take 1 tablet (0.6 mg total) by mouth every other day. 16 tablet 12 04/04/2017 at Unknown time  . fluticasone (FLONASE) 50 MCG/ACT nasal spray Place 2 sprays into both nostrils at bedtime.   Past Month at Unknown time  . hydrALAZINE (APRESOLINE) 25 MG tablet Take 1 tablet (25 mg total) by mouth every 8 (eight) hours. 90 tablet 12 04/04/2017 at 2100  . HYDROcodone-acetaminophen (NORCO) 10-325 MG tablet Take 1 tablet by mouth every 6 (six) hours as needed for moderate pain. 6 tablet 0 04/05/2017 at 1500  . lisinopril-hydrochlorothiazide (PRINZIDE,ZESTORETIC) 20-12.5 MG tablet Take 1 tablet by mouth daily.   04/04/2017 at 2100  . loperamide (IMODIUM) 2 MG capsule Take 1 capsule (2 mg total) by mouth 4 (four) times daily as needed for diarrhea or loose stools. 12 capsule 0 Past Month at Unknown time  . PARoxetine (PAXIL) 20 MG tablet Take 1 daily. This is to prevent panic attacks (Patient taking differently: Take 20-40 mg by mouth daily. Take 1 daily. This is to prevent panic attacks) 30 tablet 12 04/04/2017 at Unknown time  . risperiDONE (RISPERDAL) 0.5 MG tablet Take 1 tablet (0.5 mg total)  by mouth 2 (two) times daily.   04/04/2017 at 2100  . rOPINIRole (REQUIP) 1 MG tablet Take 1 mg by mouth at bedtime.    04/04/2017 at 2100  . torsemide (DEMADEX) 20 MG tablet Take 1 tablet (20 mg total) by mouth daily.   04/04/2017 at 2100   Scheduled: . atorvastatin  30 mg Oral Daily  . carvedilol  25 mg Oral BID WC  . colchicine  0.6 mg Oral Q48H  . fluticasone  2 spray Each Nare QHS  . heparin  5,000 Units Subcutaneous Q8H  . hydrALAZINE  50 mg Oral Q8H  . insulin aspart  0-5 Units Subcutaneous QHS  . insulin aspart  0-9 Units Subcutaneous TID WC  . PARoxetine  20 mg Oral Daily  . risperiDONE  0.5 mg Oral BID  . rOPINIRole  1 mg Oral QHS  . sodium chloride flush  3 mL Intravenous Q12H   Continuous: . sodium chloride    . cefTRIAXone (ROCEPHIN)  IV Stopped (04/07/17 2230)  . ferumoxytol Stopped (04/07/17 1117)  . furosemide Stopped (04/07/17 1850)   SSZ:YXCNBA chloride, acetaminophen **OR** acetaminophen, albuterol, ALPRAZolam, HYDROcodone-acetaminophen, sodium chloride flush  Assesment: She has been admitted with volume overload. She had pulmonary edema. Her  echocardiogram does not show evidence of systolic congestive heart failure but diastolic parameters were not evaluated well. She has acute on chronic renal failure. She is approaching dialysis. She has diabetes which has been well controlled. She has anemia and she received IV iron yesterday. She has had an episode of severe metabolic encephalopathy related to urinary tract infection but she's doing okay with that now. She's had a lot of trouble with anxiety and depression and they're stable. She has sleep apnea on CPAP stable Principal Problem:   CHF (congestive heart failure) (HCC) Active Problems:   Essential hypertension   GERD   Anemia   Diabetes mellitus, type 2 (HCC)   Acute on chronic renal failure (HCC)   Chest pain    Plan: Continue treatments    LOS: 3 days   Aspen Deterding L 04/08/2017, 8:52 AM

## 2017-04-08 NOTE — Care Management Note (Signed)
Case Management Note  Patient Details  Name: Martha [name] MRN: [id number] Date of Birth: 18-Nov-1965  Subjective/Objective:                  Pt admitted with pulmonary edema secondary to acute on chronic renal failure. Pt from home, lives with spouse. She has PCP and nephrologist. She does not drive, spouse gets her to appointments. She has CPAP she uses at home. She admits to dietary non-compliance. Reports she does not know how to follow Rx diet but would comply if she knew.   Action/Plan: CM placed consult for dietician. Plan for DC home with self care. CM will cont to follow. May need HD in future, not necessary at this time. Will cont to diurese.   Expected Discharge Date:      04/11/2017            Expected Discharge Plan:  Home w Hospice Care  In-House Referral:  NA  Discharge planning Services  CM Consult  Post Acute Care Choice:  NA Choice offered to:  NA  Status of Service:  In process, will continue to follow   Sherald Barge, RN 04/08/2017, 1:35 PM

## 2017-04-09 LAB — GLUCOSE, CAPILLARY
GLUCOSE-CAPILLARY: 116 mg/dL — AB (ref 65–99)
GLUCOSE-CAPILLARY: 184 mg/dL — AB (ref 65–99)
Glucose-Capillary: 112 mg/dL — ABNORMAL HIGH (ref 65–99)

## 2017-04-09 NOTE — Progress Notes (Signed)
Subjective: She says she feels better. She's a little less short of breath. She still has some swelling. She says that nephrology told her yesterday that she probably needed to get rid of some more fluid before she is ready for discharge. I think that's appropriate. She has no other new complaints. Plans are still for her to start dialysis next month.  Objective: Vital signs in last 24 hours: Temp:  [97 F (36.1 C)-97.7 F (36.5 C)] 97 F (36.1 C) (09/13 0610) Pulse Rate:  [66-72] 66 (09/13 0610) Resp:  [15-20] 15 (09/13 0610) BP: (123-150)/(66-79) 123/79 (09/13 0610) SpO2:  [94 %-97 %] 96 % (09/13 0610) Weight change:  Last BM Date: 04/07/17  Intake/Output from previous day: 09/12 0701 - 09/13 0700 In: 240 [P.O.:240] Out: 1100 [Urine:1100]  PHYSICAL EXAM General appearance: alert, cooperative and mild distress Resp: rales bilaterally Cardio: regular rate and rhythm, S1, S2 normal, no murmur, click, rub or gallop GI: soft, non-tender; bowel sounds normal; no masses,  no organomegaly ExtremitieStill significant edema of her legsStill significant edema of her legs Skin turgor good  Lab Results:  Results for orders placed or performed during the hospital encounter of 04/05/17 (from the past 48 hour(s))  Glucose, capillary     Status: Abnormal   Collection Time: 04/07/17 10:56 AM  Result Value Ref Range   Glucose-Capillary 154 (H) 65 - 99 mg/dL   Comment 1 Notify RN    Comment 2 Document in Chart   Glucose, capillary     Status: Abnormal   Collection Time: 04/07/17  3:58 PM  Result Value Ref Range   Glucose-Capillary 137 (H) 65 - 99 mg/dL   Comment 1 Notify RN    Comment 2 Document in Chart   Glucose, capillary     Status: Abnormal   Collection Time: 04/07/17  8:38 PM  Result Value Ref Range   Glucose-Capillary 147 (H) 65 - 99 mg/dL   Comment 1 Notify RN    Comment 2 Document in Chart   CBC     Status: Abnormal   Collection Time: 04/08/17  4:18 AM  Result Value Ref  Range   WBC 4.0 4.0 - 10.5 K/uL   RBC 3.40 (L) 3.87 - 5.11 MIL/uL   Hemoglobin 8.6 (L) 12.0 - 15.0 g/dL   HCT 81.9 (L) 68.2 - 79.9 %   MCV 84.7 78.0 - 100.0 fL   MCH 25.3 (L) 26.0 - 34.0 pg   MCHC 29.9 (L) 30.0 - 36.0 g/dL   RDW 65.7 (H) 38.0 - 13.0 %   Platelets 143 (L) 150 - 400 K/uL  Renal function panel     Status: Abnormal   Collection Time: 04/08/17  4:18 AM  Result Value Ref Range   Sodium 141 135 - 145 mmol/L   Potassium 4.7 3.5 - 5.1 mmol/L   Chloride 112 (H) 101 - 111 mmol/L   CO2 20 (L) 22 - 32 mmol/L   Glucose, Bld 119 (H) 65 - 99 mg/dL   BUN 68 (H) 6 - 20 mg/dL   Creatinine, Ser 1.75 (H) 0.44 - 1.00 mg/dL   Calcium 8.9 8.9 - 12.0 mg/dL   Phosphorus 6.4 (H) 2.5 - 4.6 mg/dL   Albumin 3.2 (L) 3.5 - 5.0 g/dL   GFR calc non Af Amer 8 (L) >60 mL/min   GFR calc Af Amer 10 (L) >60 mL/min    Comment: (NOTE) The eGFR has been calculated using the CKD EPI equation. This calculation has not been validated  in all clinical situations. eGFR's persistently <60 mL/min signify possible Chronic Kidney Disease.    Anion gap 9 5 - 15  Glucose, capillary     Status: Abnormal   Collection Time: 04/08/17  7:35 AM  Result Value Ref Range   Glucose-Capillary 131 (H) 65 - 99 mg/dL   Comment 1 Notify RN    Comment 2 Document in Chart   Glucose, capillary     Status: Abnormal   Collection Time: 04/08/17 11:14 AM  Result Value Ref Range   Glucose-Capillary 121 (H) 65 - 99 mg/dL   Comment 1 Notify RN    Comment 2 Document in Chart   Glucose, capillary     Status: None   Collection Time: 04/08/17  4:30 PM  Result Value Ref Range   Glucose-Capillary 88 65 - 99 mg/dL   Comment 1 Notify RN    Comment 2 Document in Chart   Glucose, capillary     Status: Abnormal   Collection Time: 04/08/17  8:13 PM  Result Value Ref Range   Glucose-Capillary 153 (H) 65 - 99 mg/dL   Comment 1 Notify RN    Comment 2 Document in Chart   Glucose, capillary     Status: Abnormal   Collection Time:  04/09/17  7:23 AM  Result Value Ref Range   Glucose-Capillary 116 (H) 65 - 99 mg/dL   Comment 1 Notify RN    Comment 2 Document in Chart     ABGS No results for input(s): PHART, PO2ART, TCO2, HCO3 in the last 72 hours.  Invalid input(s): PCO2 CULTURES Recent Results (from the past 240 hour(s))  Urine culture     Status: Abnormal   Collection Time: 04/05/17  7:21 PM  Result Value Ref Range Status   Specimen Description URINE, RANDOM  Final   Special Requests NONE  Final   Culture >=100,000 COLONIES/mL ENTEROBACTER AEROGENES (A)  Final   Report Status 04/08/2017 FINAL  Final   Organism ID, Bacteria ENTEROBACTER AEROGENES (A)  Final      Susceptibility   Enterobacter aerogenes - MIC*    CEFAZOLIN RESISTANT Resistant     CEFTRIAXONE <=1 SENSITIVE Sensitive     CIPROFLOXACIN <=0.25 SENSITIVE Sensitive     GENTAMICIN <=1 SENSITIVE Sensitive     IMIPENEM <=0.25 SENSITIVE Sensitive     NITROFURANTOIN 64 INTERMEDIATE Intermediate     TRIMETH/SULFA <=20 SENSITIVE Sensitive     PIP/TAZO <=4 SENSITIVE Sensitive     * >=100,000 COLONIES/mL ENTEROBACTER AEROGENES   Studies/Results: No results found.  Medications:  Prior to Admission:  Prescriptions Prior to Admission  Medication Sig Dispense Refill Last Dose  . albuterol (PROVENTIL HFA;VENTOLIN HFA) 108 (90 Base) MCG/ACT inhaler Inhale 2 puffs into the lungs every 4 (four) hours as needed for wheezing or shortness of breath. 1 Inhaler 0 04/05/2017 at 1500  . ALPRAZolam (XANAX) 1 MG tablet Take 1 mg by mouth 4 (four) times daily as needed for anxiety (nerves).   04/05/2017 at 1500  . atorvastatin (LIPITOR) 10 MG tablet Take 10 mg by mouth daily. Take along with 20 mg tablet to equal 30 mg daily.   04/04/2017 at 2100  . atorvastatin (LIPITOR) 20 MG tablet Take 20 mg by mouth daily. With Atorvastatin 10 mg to equal 30 mg total daily   04/04/2017 at 2100  . carvedilol (COREG) 12.5 MG tablet Take 1 tablet (12.5 mg total) by mouth 2 (two) times daily  with a meal. 60 tablet 0 04/04/2017 at 2100  .  colchicine 0.6 MG tablet Take 1 tablet (0.6 mg total) by mouth every other day. 16 tablet 12 04/04/2017 at Unknown time  . fluticasone (FLONASE) 50 MCG/ACT nasal spray Place 2 sprays into both nostrils at bedtime.   Past Month at Unknown time  . hydrALAZINE (APRESOLINE) 25 MG tablet Take 1 tablet (25 mg total) by mouth every 8 (eight) hours. 90 tablet 12 04/04/2017 at 2100  . HYDROcodone-acetaminophen (NORCO) 10-325 MG tablet Take 1 tablet by mouth every 6 (six) hours as needed for moderate pain. 6 tablet 0 04/05/2017 at 1500  . lisinopril-hydrochlorothiazide (PRINZIDE,ZESTORETIC) 20-12.5 MG tablet Take 1 tablet by mouth daily.   04/04/2017 at 2100  . loperamide (IMODIUM) 2 MG capsule Take 1 capsule (2 mg total) by mouth 4 (four) times daily as needed for diarrhea or loose stools. 12 capsule 0 Past Month at Unknown time  . PARoxetine (PAXIL) 20 MG tablet Take 1 daily. This is to prevent panic attacks (Patient taking differently: Take 20-40 mg by mouth daily. Take 1 daily. This is to prevent panic attacks) 30 tablet 12 04/04/2017 at Unknown time  . risperiDONE (RISPERDAL) 0.5 MG tablet Take 1 tablet (0.5 mg total) by mouth 2 (two) times daily.   04/04/2017 at 2100  . rOPINIRole (REQUIP) 1 MG tablet Take 1 mg by mouth at bedtime.    04/04/2017 at 2100  . torsemide (DEMADEX) 20 MG tablet Take 1 tablet (20 mg total) by mouth daily.   04/04/2017 at 2100   Scheduled: . atorvastatin  30 mg Oral Daily  . carvedilol  25 mg Oral BID WC  . colchicine  0.6 mg Oral Q48H  . fluticasone  2 spray Each Nare QHS  . heparin  5,000 Units Subcutaneous Q8H  . hydrALAZINE  50 mg Oral Q8H  . insulin aspart  0-5 Units Subcutaneous QHS  . insulin aspart  0-9 Units Subcutaneous TID WC  . PARoxetine  20 mg Oral Daily  . risperiDONE  0.5 mg Oral BID  . rOPINIRole  1 mg Oral QHS  . sodium chloride flush  3 mL Intravenous Q12H   Continuous: . sodium chloride    . cefTRIAXone (ROCEPHIN)  IV  Stopped (04/08/17 2113)  . ferumoxytol Stopped (04/07/17 1117)  . furosemide 160 mg (04/09/17 0848)   GQB:VQXIHW chloride, acetaminophen **OR** acetaminophen, albuterol, ALPRAZolam, HYDROcodone-acetaminophen, sodium chloride flush  Assesment: She was admitted with pulmonary edema from volume overload from her renal failure. She does not have evidence of heart failure. She has hypertension at baseline which is stable. She has diabetes which is doing much better than it has been in the past. 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 She is still somewhat volume overloaded. She has sleep apnea on CPAP stable Principal Problem:   CHF (congestive heart failure) (HCC) Active Problems:   Essential hypertension   GERD   Anemia   Diabetes mellitus, type 2 (HCC)   Acute on chronic renal failure (HCC)   Chest pain    Plan: Continue current treatments    LOS: 4 days   Mischa Pollard L 04/09/2017, 9:01 AM

## 2017-04-09 NOTE — Progress Notes (Signed)
Subjective: Interval History: Patient states that she is feeling much better. Her breathing is good and she doesn't have any nausea or vomiting.  Objective: Vital signs in last 24 hours: Temp:  [97 F (36.1 C)-97.7 F (36.5 C)] 97 F (36.1 C) (09/13 0610) Pulse Rate:  [66-72] 66 (09/13 0610) Resp:  [15-20] 15 (09/13 0610) BP: (123-150)/(66-79) 123/79 (09/13 0610) SpO2:  [94 %-97 %] 96 % (09/13 0610) Weight change:   Intake/Output from previous day: 09/12 0701 - 09/13 0700 In: 240 [P.O.:240] Out: 1100 [Urine:1100] Intake/Output this shift: Total I/O In: -  Out: 200 [Urine:200]  General appearance: alert, cooperative and no distress Resp: diminished breath sounds bilaterally Cardio: regular rate and rhythm Extremities: edema She has 2+ edema  Lab Results:  Recent Labs  04/08/17 0418  WBC 4.0  HGB 8.6*  HCT 28.8*  PLT 143*   BMET:   Recent Labs  04/07/17 0508 04/08/17 0418  NA 141 141  K 4.9 4.7  CL 113* 112*  CO2 19* 20*  GLUCOSE 126* 119*  BUN 67* 68*  CREATININE 5.34* 5.53*  CALCIUM 8.8* 8.9   No results for input(s): PTH in the last 72 hours. Iron Studies: No results for input(s): IRON, TIBC, TRANSFERRIN, FERRITIN in the last 72 hours.  Studies/Results: No results found.  I have reviewed the patient's current medications.  Assessment/Plan: Problem #1 difficulty breathing: Possibly a combination of COPD/sleep apnea/CHF. Presently she is on Lasix And metolazone combination. She has 1100 mL of urine output and she is feeling better.  Problem #2renal failure: Chronic. Presently her creatinine is increasing most likely from fluid removal. Patient however does not have uremic sign and symptoms.  Problem #3 anemia: Her hemoglobin is below target goal. Presently her iron saturation is 10%. Ferritin 64 hence iron deficiency anemia. Patient has received 1 dose of Feraheme. The second dose will be given next week. . Problem #4 Bone and  mineral disorder: Her  calcium is range but her phosphorus is high. Patient presently is not on a binder. Problem #5 history of hypertension: Her blood pressure is reasonably controlled Problem #6 history of diabetes Problem #7 history of atrial fibrillation: Her heart rate is controlled Problem #8 history of sleep apnea once CPAP Plan: 1]We'll on PhosLo 667 milligrams 1 tablet by mouth 3 times a day with her meals. 2] continue his diuretics 3] will check her vitamin D level, CBC, intact PTH, renal panel in the morning 4] Patient does not need dialysis    LOS: 4 days   Benedicto Capozzi S 04/09/2017,9:18 AM

## 2017-04-09 NOTE — ACP (Advance Care Planning) (Signed)
Brought her the Advance Directive documents. Will check to see if she wishes to complete.

## 2017-04-10 DIAGNOSIS — G9341 Metabolic encephalopathy: Secondary | ICD-10-CM | POA: Diagnosis present

## 2017-04-10 LAB — CBC
HCT: 29.4 % — ABNORMAL LOW (ref 36.0–46.0)
Hemoglobin: 8.9 g/dL — ABNORMAL LOW (ref 12.0–15.0)
MCH: 25.8 pg — ABNORMAL LOW (ref 26.0–34.0)
MCHC: 30.3 g/dL (ref 30.0–36.0)
MCV: 85.2 fL (ref 78.0–100.0)
PLATELETS: 141 10*3/uL — AB (ref 150–400)
RBC: 3.45 MIL/uL — AB (ref 3.87–5.11)
RDW: 17 % — AB (ref 11.5–15.5)
WBC: 4 10*3/uL (ref 4.0–10.5)

## 2017-04-10 LAB — GLUCOSE, CAPILLARY
GLUCOSE-CAPILLARY: 114 mg/dL — AB (ref 65–99)
GLUCOSE-CAPILLARY: 147 mg/dL — AB (ref 65–99)
Glucose-Capillary: 165 mg/dL — ABNORMAL HIGH (ref 65–99)

## 2017-04-10 LAB — RENAL FUNCTION PANEL
ALBUMIN: 3.4 g/dL — AB (ref 3.5–5.0)
Anion gap: 12 (ref 5–15)
BUN: 68 mg/dL — AB (ref 6–20)
CALCIUM: 9.2 mg/dL (ref 8.9–10.3)
CHLORIDE: 110 mmol/L (ref 101–111)
CO2: 20 mmol/L — ABNORMAL LOW (ref 22–32)
CREATININE: 5.86 mg/dL — AB (ref 0.44–1.00)
GFR, EST AFRICAN AMERICAN: 9 mL/min — AB (ref >60)
GFR, EST NON AFRICAN AMERICAN: 8 mL/min — AB (ref >60)
Glucose, Bld: 111 mg/dL — ABNORMAL HIGH (ref 65–99)
Phosphorus: 6.9 mg/dL — ABNORMAL HIGH (ref 2.5–4.6)
Potassium: 4.2 mmol/L (ref 3.5–5.1)
SODIUM: 142 mmol/L (ref 135–145)

## 2017-04-10 MED ORDER — TORSEMIDE 20 MG PO TABS: 60.0000 mg | ORAL_TABLET | Freq: Every day | ORAL | 12 refills | Status: DC

## 2017-04-10 MED ORDER — CEFDINIR 300 MG PO CAPS: 300.0000 mg | ORAL_CAPSULE | Freq: Every day | ORAL | 0 refills | Status: DC

## 2017-04-10 NOTE — Progress Notes (Signed)
Discharge instructions read to patient and her husband. Both verbalized understanding of instructions. Discharged to home with husband.

## 2017-04-10 NOTE — Progress Notes (Signed)
She feels better and wants to go home. Discussed with Dr. Lowanda Foster and he agrees she is ok.will discharge.

## 2017-04-10 NOTE — Discharge Summary (Signed)
Physician Discharge Summary  Patient ID: Martha [name] MRN: [id number] DOB/AGE: 1966/07/13 51 y.o. Primary Care Physician:Desiray Orchard, Percell Miller, MD Admit date: 04/05/2017 Discharge date: 04/10/2017    Discharge Diagnoses:   Principal Problem:   Acute pulmonary edema [facility]) Active Problems:   Essential hypertension   GERD   Anemia   Diabetes mellitus, type 2 (HCC)   Acute on chronic renal failure (HCC)   Chest pain   Metabolic encephalopathy   Allergies as of 04/10/2017      Reactions   Codeine Nausea And Vomiting      Medication List    TAKE these medications   albuterol 108 (90 Base) MCG/ACT inhaler Commonly known as:  PROVENTIL HFA;VENTOLIN HFA Inhale 2 puffs into the lungs every 4 (four) hours as needed for wheezing or shortness of breath.   ALPRAZolam 1 MG tablet Commonly known as:  XANAX Take 1 mg by mouth 4 (four) times daily as needed for anxiety (nerves).   atorvastatin 10 MG tablet Commonly known as:  LIPITOR Take 10 mg by mouth daily. Take along with 20 mg tablet to equal 30 mg daily.   atorvastatin 20 MG tablet Commonly known as:  LIPITOR Take 20 mg by mouth daily. With Atorvastatin 10 mg to equal 30 mg total daily   carvedilol 12.5 MG tablet Commonly known as:  COREG Take 1 tablet (12.5 mg total) by mouth 2 (two) times daily with a meal.   cefdinir 300 MG capsule Commonly known as:  OMNICEF Take 1 capsule (300 mg total) by mouth daily.   colchicine 0.6 MG tablet Take 1 tablet (0.6 mg total) by mouth every other day.   fluticasone 50 MCG/ACT nasal spray Commonly known as:  FLONASE Place 2 sprays into both nostrils at bedtime.   hydrALAZINE 25 MG tablet Commonly known as:  APRESOLINE Take 1 tablet (25 mg total) by mouth every 8 (eight) hours.   HYDROcodone-acetaminophen 10-325 MG tablet Commonly known as:  NORCO Take 1 tablet by mouth every 6 (six) hours as needed for moderate pain.   lisinopril-hydrochlorothiazide 20-12.5 MG tablet Commonly known  as:  PRINZIDE,ZESTORETIC Take 1 tablet by mouth daily.   loperamide 2 MG capsule Commonly known as:  IMODIUM Take 1 capsule (2 mg total) by mouth 4 (four) times daily as needed for diarrhea or loose stools.   PARoxetine 20 MG tablet Commonly known as:  PAXIL Take 1 daily. This is to prevent panic attacks What changed:  how much to take  how to take this  when to take this  additional instructions   risperiDONE 0.5 MG tablet Commonly known as:  RISPERDAL Take 1 tablet (0.5 mg total) by mouth 2 (two) times daily.   rOPINIRole 1 MG tablet Commonly known as:  REQUIP Take 1 mg by mouth at bedtime.   torsemide 20 MG tablet Commonly known as:  DEMADEX Take 3 tablets (60 mg total) by mouth daily. What changed:  how much to take            Discharge Care Instructions        Start     Ordered   04/10/17 0000  torsemide (DEMADEX) 20 MG tablet  Daily     04/10/17 0852   04/10/17 0000  cefdinir (OMNICEF) 300 MG capsule  Daily     04/10/17 0852      Discharged Condition:Improved    Consults: Nephrology  Significant Diagnostic Studies: Dg Chest Port 1 View  Result Date: 04/05/2017 CLINICAL DATA:  Patient with  shortness of breath, worsening. Nonproductive cough for 1 week. EXAM: PORTABLE CHEST 1 VIEW COMPARISON:  Nonproductive FINDINGS: Monitoring leads overlie the patient. Enlarged cardiac and mediastinal contours. Pulmonary vascular redistribution and mild interstitial pulmonary opacities bilaterally. Possible small bilateral pleural effusions. IMPRESSION: Enlarged cardiac contours. Bilateral perihilar interstitial pulmonary opacities suggestive of mild interstitial edema. Possible small bilateral pleural effusions. Electronically Signed   By: Lovey Newcomer M.D.   On: 04/05/2017 19:01    Lab Results: Basic Metabolic Panel:  Recent Labs  04/08/17 0418 04/10/17 0425  NA 141 142  K 4.7 4.2  CL 112* 110  CO2 20* 20*  GLUCOSE 119* 111*  BUN 68* 68*  CREATININE  5.53* 5.86*  CALCIUM 8.9 9.2  PHOS 6.4* 6.9*   Liver Function Tests:  Recent Labs  04/08/17 0418 04/10/17 0425  ALBUMIN 3.2* 3.4*     CBC:  Recent Labs  04/08/17 0418 04/10/17 0425  WBC 4.0 4.0  HGB 8.6* 8.9*  HCT 28.8* 29.4*  MCV 84.7 85.2  PLT 143* 141*    Recent Results (from the past 240 hour(s))  Urine culture     Status: Abnormal   Collection Time: 04/05/17  7:21 PM  Result Value Ref Range Status   Specimen Description URINE, RANDOM  Final   Special Requests NONE  Final   Culture >=100,000 COLONIES/mL ENTEROBACTER AEROGENES (A)  Final   Report Status 04/08/2017 FINAL  Final   Organism ID, Bacteria ENTEROBACTER AEROGENES (A)  Final      Susceptibility   Enterobacter aerogenes - MIC*    CEFAZOLIN RESISTANT Resistant     CEFTRIAXONE <=1 SENSITIVE Sensitive     CIPROFLOXACIN <=0.25 SENSITIVE Sensitive     GENTAMICIN <=1 SENSITIVE Sensitive     IMIPENEM <=0.25 SENSITIVE Sensitive     NITROFURANTOIN 64 INTERMEDIATE Intermediate     TRIMETH/SULFA <=20 SENSITIVE Sensitive     PIP/TAZO <=4 SENSITIVE Sensitive     * >=100,000 COLONIES/mL ENTEROBACTER AEROGENES     Hospital Course: This is a 51 year old came to the emergency department with altered mental status. She was thought to have urinary tract infection and she also had acute on chronic renal failure and pulmonary edema. She had echocardiogram that did not show evidence of heart failure. She was treated with intravenous Lasix and improved. Her breathing improved substantially. Her urinary tract infection was treated. She is approaching dialysis which is planned for next month. She had nephrology consultation and by the time of discharge her breathing was much better her edema was much better and she was no longer encephalopathic. She was back at baseline.  Discharge Exam: Blood pressure 114/63, pulse 66, temperature (!) 97.4 F (36.3 C), temperature source Oral, resp. rate 15, height 5\' 8"  (1.727 m), weight (!)  158.4 kg (349 lb 1.6 oz), SpO2 95 %. She is awake and alert. She is morbidly obese. Her chest is clear now. She still has 1+ edema of her legs.  Disposition: Home    Follow-up Information    Fran Lowes, MD Follow up in 2 week(s).   Specialty:  Nephrology Contact information: [address] [address] [phone]           Signed: Alonza Bogus   04/10/2017, 8:52 AM

## 2017-04-10 NOTE — Care Management Note (Signed)
Case Management Note  Patient Details  Name: Martha [name] MRN: [id number] Date of Birth: 08-07-1965  Expected Discharge Date:  04/10/17               Expected Discharge Plan:  Home/Self Care  In-House Referral:  NA  Discharge planning Services  CM Consult  Post Acute Care Choice:  NA Choice offered to:  NA  Status of Service:  Completed, signed off  Additional Comments: DC home today with self care. Nutrition consult completed and poor compliance expected. MD aware.   Sherald Barge, RN 04/10/2017, 10:28 AM

## 2017-04-10 NOTE — Progress Notes (Signed)
Subjective: Interval History: Patient offers no complaints. She is feeling much better and presently would like to go home.  Objective: Vital signs in last 24 hours: Temp:  [97.4 F (36.3 C)-97.7 F (36.5 C)] 97.4 F (36.3 C) (09/14 0548) Pulse Rate:  [65-69] 66 (09/14 0548) Resp:  [15-20] 15 (09/14 0548) BP: (114-130)/(63-80) 114/63 (09/14 0548) SpO2:  [95 %-98 %] 95 % (09/14 0548) Weight:  [158.4 kg (349 lb 1.6 oz)] 158.4 kg (349 lb 1.6 oz) (09/14 0548) Weight change: -3.049 kg (-6 lb 11.6 oz)  Intake/Output from previous day: 09/13 0701 - 09/14 0700 In: 1036 [P.O.:600; IV Piggyback:216] Out: 2250 [Urine:2250] Intake/Output this shift: No intake/output data recorded.  Generally patient is alert and not in distress Chest: Decreased breath sound bilaterally Heart exam regular rate and rhythm Extremities she has 1+ edema bilaterally  Lab Results:  Recent Labs  04/08/17 0418 04/10/17 0425  WBC 4.0 4.0  HGB 8.6* 8.9*  HCT 28.8* 29.4*  PLT 143* 141*   BMET:   Recent Labs  04/08/17 0418 04/10/17 0425  NA 141 142  K 4.7 4.2  CL 112* 110  CO2 20* 20*  GLUCOSE 119* 111*  BUN 68* 68*  CREATININE 5.53* 5.86*  CALCIUM 8.9 9.2   No results for input(s): PTH in the last 72 hours. Iron Studies: No results for input(s): IRON, TIBC, TRANSFERRIN, FERRITIN in the last 72 hours.  Studies/Results: No results found.  I have reviewed the patient's current medications.  Assessment/Plan: Problem #1 difficulty breathing: Possibly a combination of COPD/sleep apnea/CHF. Presently she is on Lasix And metolazone combination. She Has 2200 mL of urine output. Problem #2renal failure: Chronic. Presently her creatinine is increasing most likely from fluid removal. Patient however does not have uremic sign and symptoms.  Problem #3 anemia: Her hemoglobin is below target goal. Possibly a combination of iron deficiency and anemia of chronic disease. Patient has received 1 dose of IV iron   Problem #4 Bone and  mineral disorder: Her calcium is range but her phosphorus is high. Patient is started on a binder. Her PTH and vitamin D level is pending. Problem #5 history of hypertension: Her blood pressure is reasonably controlled Problem #6 history of diabetes Problem #7 history of atrial fibrillation: Her heart rate is controlled Problem #8 history of sleep apnea once CPAP Plan: 1]We'll DC Lasix and start her on Demadex 60 mg once a day 2] we'll continue with PhosLo 3] we'll see patient in 2 weeks and make an adjustment on her medications or decide if she required dialysis. 4] Patient does not need dialysis    LOS: 5 days   Cheston Coury S 04/10/2017,8:35 AM

## 2017-04-11 LAB — PTH, INTACT AND CALCIUM
CALCIUM TOTAL (PTH): 9.4 mg/dL (ref 8.7–10.2)
PTH: 103 pg/mL — AB (ref 15–65)

## 2017-04-11 LAB — VITAMIN D 25 HYDROXY (VIT D DEFICIENCY, FRACTURES): Vit D, 25-Hydroxy: 17.1 ng/mL — ABNORMAL LOW (ref 30.0–100.0)

## 2017-04-20 ENCOUNTER — Emergency Department (HOSPITAL_COMMUNITY): Payer: Medicaid Other

## 2017-04-20 ENCOUNTER — Encounter (HOSPITAL_COMMUNITY): Payer: Self-pay | Admitting: Emergency Medicine

## 2017-04-20 ENCOUNTER — Inpatient Hospital Stay (HOSPITAL_COMMUNITY)
Admission: EM | Admit: 2017-04-20 | Discharge: 2017-04-25 | DRG: 291 | Disposition: A | Discharge status: Home / Self Care | Payer: Medicaid Other | Attending: Family Medicine | Admitting: Family Medicine

## 2017-04-20 DIAGNOSIS — G4733 Obstructive sleep apnea (adult) (pediatric): Secondary | ICD-10-CM | POA: Diagnosis present

## 2017-04-20 DIAGNOSIS — Z79899 Other long term (current) drug therapy: Secondary | ICD-10-CM

## 2017-04-20 DIAGNOSIS — N185 Chronic kidney disease, stage 5: Secondary | ICD-10-CM | POA: Diagnosis not present

## 2017-04-20 DIAGNOSIS — N189 Chronic kidney disease, unspecified: Secondary | ICD-10-CM | POA: Diagnosis present

## 2017-04-20 DIAGNOSIS — E1122 Type 2 diabetes mellitus with diabetic chronic kidney disease: Secondary | ICD-10-CM | POA: Diagnosis present

## 2017-04-20 DIAGNOSIS — D649 Anemia, unspecified: Secondary | ICD-10-CM | POA: Diagnosis present

## 2017-04-20 DIAGNOSIS — N049 Nephrotic syndrome with unspecified morphologic changes: Secondary | ICD-10-CM

## 2017-04-20 DIAGNOSIS — N186 End stage renal disease: Secondary | ICD-10-CM | POA: Diagnosis present

## 2017-04-20 DIAGNOSIS — Z6841 Body Mass Index (BMI) 40.0 and over, adult: Secondary | ICD-10-CM

## 2017-04-20 DIAGNOSIS — J449 Chronic obstructive pulmonary disease, unspecified: Secondary | ICD-10-CM | POA: Diagnosis present

## 2017-04-20 DIAGNOSIS — E785 Hyperlipidemia, unspecified: Secondary | ICD-10-CM | POA: Diagnosis present

## 2017-04-20 DIAGNOSIS — Z87891 Personal history of nicotine dependence: Secondary | ICD-10-CM

## 2017-04-20 DIAGNOSIS — F329 Major depressive disorder, single episode, unspecified: Secondary | ICD-10-CM | POA: Diagnosis present

## 2017-04-20 DIAGNOSIS — K219 Gastro-esophageal reflux disease without esophagitis: Secondary | ICD-10-CM | POA: Diagnosis present

## 2017-04-20 DIAGNOSIS — G2581 Restless legs syndrome: Secondary | ICD-10-CM | POA: Diagnosis present

## 2017-04-20 DIAGNOSIS — I132 Hypertensive heart and chronic kidney disease with heart failure and with stage 5 chronic kidney disease, or end stage renal disease: Principal | ICD-10-CM | POA: Diagnosis present

## 2017-04-20 DIAGNOSIS — I10 Essential (primary) hypertension: Secondary | ICD-10-CM | POA: Diagnosis present

## 2017-04-20 DIAGNOSIS — F319 Bipolar disorder, unspecified: Secondary | ICD-10-CM | POA: Diagnosis present

## 2017-04-20 DIAGNOSIS — Z794 Long term (current) use of insulin: Secondary | ICD-10-CM

## 2017-04-20 DIAGNOSIS — I5033 Acute on chronic diastolic (congestive) heart failure: Secondary | ICD-10-CM | POA: Diagnosis present

## 2017-04-20 DIAGNOSIS — R601 Generalized edema: Secondary | ICD-10-CM

## 2017-04-20 DIAGNOSIS — G4730 Sleep apnea, unspecified: Secondary | ICD-10-CM | POA: Diagnosis present

## 2017-04-20 DIAGNOSIS — N2581 Secondary hyperparathyroidism of renal origin: Secondary | ICD-10-CM | POA: Diagnosis present

## 2017-04-20 DIAGNOSIS — N179 Acute kidney failure, unspecified: Secondary | ICD-10-CM | POA: Diagnosis present

## 2017-04-20 DIAGNOSIS — D631 Anemia in chronic kidney disease: Secondary | ICD-10-CM | POA: Diagnosis present

## 2017-04-20 DIAGNOSIS — E119 Type 2 diabetes mellitus without complications: Secondary | ICD-10-CM

## 2017-04-20 HISTORY — DX: Heart failure, unspecified: I50.9

## 2017-04-20 LAB — COMPREHENSIVE METABOLIC PANEL
ALBUMIN: 3.4 g/dL — AB (ref 3.5–5.0)
ALK PHOS: 64 U/L (ref 38–126)
ALT: 14 U/L (ref 14–54)
ANION GAP: 8 (ref 5–15)
AST: 11 U/L — ABNORMAL LOW (ref 15–41)
BUN: 61 mg/dL — ABNORMAL HIGH (ref 6–20)
CALCIUM: 9 mg/dL (ref 8.9–10.3)
CO2: 23 mmol/L (ref 22–32)
Chloride: 111 mmol/L (ref 101–111)
Creatinine, Ser: 5.37 mg/dL — ABNORMAL HIGH (ref 0.44–1.00)
GFR calc Af Amer: 10 mL/min — ABNORMAL LOW (ref >60)
GFR calc non Af Amer: 9 mL/min — ABNORMAL LOW (ref >60)
GLUCOSE: 114 mg/dL — AB (ref 65–99)
Potassium: 4.7 mmol/L (ref 3.5–5.1)
SODIUM: 142 mmol/L (ref 135–145)
Total Bilirubin: 0.5 mg/dL (ref 0.3–1.2)
Total Protein: 6.7 g/dL (ref 6.5–8.1)

## 2017-04-20 LAB — CBC WITH DIFFERENTIAL/PLATELET
BASOS ABS: 0 10*3/uL (ref 0.0–0.1)
BASOS PCT: 1 %
EOS ABS: 0.2 10*3/uL (ref 0.0–0.7)
Eosinophils Relative: 4 %
HCT: 29.8 % — ABNORMAL LOW (ref 36.0–46.0)
HEMOGLOBIN: 9.1 g/dL — AB (ref 12.0–15.0)
Lymphocytes Relative: 28 %
Lymphs Abs: 1.2 10*3/uL (ref 0.7–4.0)
MCH: 26.1 pg (ref 26.0–34.0)
MCHC: 30.5 g/dL (ref 30.0–36.0)
MCV: 85.4 fL (ref 78.0–100.0)
Monocytes Absolute: 0.4 10*3/uL (ref 0.1–1.0)
Monocytes Relative: 8 %
NEUTROS PCT: 59 %
Neutro Abs: 2.5 10*3/uL (ref 1.7–7.7)
Platelets: 141 10*3/uL — ABNORMAL LOW (ref 150–400)
RBC: 3.49 MIL/uL — AB (ref 3.87–5.11)
RDW: 17.6 % — ABNORMAL HIGH (ref 11.5–15.5)
WBC: 4.3 10*3/uL (ref 4.0–10.5)

## 2017-04-20 LAB — BRAIN NATRIURETIC PEPTIDE: B Natriuretic Peptide: 836 pg/mL — ABNORMAL HIGH (ref 0.0–100.0)

## 2017-04-20 MED ORDER — HEPARIN SODIUM (PORCINE) 5000 UNIT/ML IJ SOLN
5000.0000 [IU] | Freq: Three times a day (TID) | INTRAMUSCULAR | Status: DC
  Administered 2017-04-20 – 2017-04-25 (×14): 5000 [IU] via SUBCUTANEOUS
  Filled 2017-04-20 (×14): qty 1

## 2017-04-20 MED ORDER — DEXTROSE 5 % IV SOLN
120.0000 mg | Freq: Once | INTRAVENOUS | Status: AC
  Administered 2017-04-20: 120 mg via INTRAVENOUS
  Filled 2017-04-20: qty 12

## 2017-04-20 MED ORDER — CARVEDILOL 12.5 MG PO TABS
12.5000 mg | ORAL_TABLET | Freq: Two times a day (BID) | ORAL | Status: DC
  Administered 2017-04-21 – 2017-04-24 (×6): 12.5 mg via ORAL
  Filled 2017-04-20 (×7): qty 1

## 2017-04-20 MED ORDER — HYDRALAZINE HCL 25 MG PO TABS
25.0000 mg | ORAL_TABLET | Freq: Three times a day (TID) | ORAL | Status: DC
  Administered 2017-04-20 – 2017-04-25 (×11): 25 mg via ORAL
  Filled 2017-04-20 (×13): qty 1

## 2017-04-20 MED ORDER — ALPRAZOLAM 1 MG PO TABS
1.0000 mg | ORAL_TABLET | Freq: Four times a day (QID) | ORAL | Status: DC | PRN
  Administered 2017-04-20 – 2017-04-23 (×4): 1 mg via ORAL
  Filled 2017-04-20 (×5): qty 1

## 2017-04-20 MED ORDER — FUROSEMIDE 10 MG/ML IJ SOLN
120.0000 mg | Freq: Two times a day (BID) | INTRAVENOUS | Status: DC
  Administered 2017-04-21 – 2017-04-24 (×7): 120 mg via INTRAVENOUS
  Filled 2017-04-20 (×13): qty 12

## 2017-04-20 MED ORDER — ONDANSETRON HCL 4 MG PO TABS: 4.0000 mg | ORAL_TABLET | Freq: Four times a day (QID) | ORAL | Status: DC | PRN

## 2017-04-20 MED ORDER — RISPERIDONE 0.5 MG PO TABS
0.5000 mg | ORAL_TABLET | Freq: Two times a day (BID) | ORAL | Status: DC
Start: 2017-04-20 — End: 2017-04-25
  Administered 2017-04-20 – 2017-04-25 (×9): 0.5 mg via ORAL
  Filled 2017-04-20 (×10): qty 1

## 2017-04-20 MED ORDER — ALBUTEROL SULFATE (2.5 MG/3ML) 0.083% IN NEBU
3.0000 mL | INHALATION_SOLUTION | RESPIRATORY_TRACT | Status: DC | PRN
Start: 2017-04-20 — End: 2017-04-25
  Administered 2017-04-22 – 2017-04-24 (×2): 3 mL via RESPIRATORY_TRACT
  Filled 2017-04-20 (×2): qty 3

## 2017-04-20 MED ORDER — INSULIN ASPART 100 UNIT/ML ~~LOC~~ SOLN
0.0000 [IU] | Freq: Three times a day (TID) | SUBCUTANEOUS | Status: DC
  Administered 2017-04-21: 2 [IU] via SUBCUTANEOUS
  Administered 2017-04-22 – 2017-04-25 (×8): 1 [IU] via SUBCUTANEOUS

## 2017-04-20 MED ORDER — HYDROCODONE-ACETAMINOPHEN 10-325 MG PO TABS
1.0000 | ORAL_TABLET | Freq: Four times a day (QID) | ORAL | Status: DC | PRN
  Administered 2017-04-20 – 2017-04-25 (×9): 1 via ORAL
  Filled 2017-04-20 (×10): qty 1

## 2017-04-20 MED ORDER — PAROXETINE HCL 20 MG PO TABS
20.0000 mg | ORAL_TABLET | Freq: Every day | ORAL | Status: DC
  Administered 2017-04-22 – 2017-04-25 (×4): 20 mg via ORAL
  Filled 2017-04-20 (×5): qty 1

## 2017-04-20 MED ORDER — ATORVASTATIN CALCIUM 20 MG PO TABS
30.0000 mg | ORAL_TABLET | Freq: Every day | ORAL | Status: DC
  Administered 2017-04-21 – 2017-04-24 (×4): 30 mg via ORAL
  Filled 2017-04-20 (×4): qty 2

## 2017-04-20 MED ORDER — FUROSEMIDE 10 MG/ML IJ SOLN
INTRAMUSCULAR | Status: AC
Start: 2017-04-20 — End: 2017-04-20
  Filled 2017-04-20: qty 20

## 2017-04-20 MED ORDER — SODIUM CHLORIDE 0.9% FLUSH
3.0000 mL | Freq: Two times a day (BID) | INTRAVENOUS | Status: DC
  Administered 2017-04-20 – 2017-04-25 (×10): 3 mL via INTRAVENOUS

## 2017-04-20 MED ORDER — SODIUM CHLORIDE 0.9 % IV SOLN: 250.0000 mL | INTRAVENOUS | Status: DC | PRN

## 2017-04-20 MED ORDER — SODIUM CHLORIDE 0.9% FLUSH: 3.0000 mL | INTRAVENOUS | Status: DC | PRN

## 2017-04-20 MED ORDER — ONDANSETRON HCL 4 MG/2ML IJ SOLN: 4.0000 mg | Freq: Four times a day (QID) | INTRAMUSCULAR | Status: DC | PRN

## 2017-04-20 MED ORDER — ROPINIROLE HCL 1 MG PO TABS
1.0000 mg | ORAL_TABLET | Freq: Every day | ORAL | Status: DC
  Administered 2017-04-20 – 2017-04-24 (×5): 1 mg via ORAL
  Filled 2017-04-20 (×5): qty 1

## 2017-04-20 NOTE — ED Notes (Signed)
Report to [place], RN 300

## 2017-04-20 NOTE — ED Provider Notes (Signed)
[facility] DEPT Provider Note   CSN: [id number] Arrival date & time: 04/20/17  1522     History   Chief Complaint Chief Complaint  Patient presents with  . Shortness of Breath    HPI Martha [name] is a 51 y.o. female.  Patient complains of shortness of breath and worsening swelling in her legs abdomen and chest. Patient has history of heart failure and renal failure   The history is provided by the patient.  Shortness of Breath  This is a new problem. The problem occurs frequently.The current episode started more than 2 days ago. The problem has not changed since onset.Pertinent negatives include no fever, no headaches, no cough, no PND, no chest pain, no abdominal pain and no rash. It is unknown what precipitated the problem. Risk factors: Diabetes. She has tried nothing for the symptoms. The treatment provided no relief. She has had prior hospitalizations. She has had prior ED visits. Associated medical issues do not include recent surgery.    Past Medical History:  Diagnosis Date  . Acid reflux    takes Tums  . Anemia   . Arthritis   . Bipolar 1 disorder ([place])   . Cervical radiculopathy   . CHF (congestive heart failure) ([place])   . Chronic kidney disease    Stage 5- 01/25/17  . COPD (chronic obstructive pulmonary disease) ([place]) 2014   bronchitis  . Degenerative disc disease, thoracic   . Depression   . Diabetes mellitus    Type II  . Hypertension   . Noncompliance with medication regimen   . Obesity (BMI 30-39.9)   . OSA (obstructive sleep apnea)    cpap  . Panic attack   . RLS (restless legs syndrome)     Patient Active Problem List   Diagnosis Date Noted  . Metabolic encephalopathy 12/45/8099  . Acute pulmonary edema ([place]) 04/05/2017  . Acute psychosis 12/01/2016  . Protein-calorie malnutrition, severe 11/29/2016  . Confusion 11/22/2016  . HTN (hypertension), malignant 11/20/2016  . Diabetes mellitus 11/20/2016  . Bipolar 1 disorder ([place]) 11/20/2016    . Noncompliance with medication regimen 11/20/2016  . Panic attack 11/20/2016  . CKD stage 5 secondary to hypertension ([place]) 11/15/2016  . AKI (acute kidney injury) ([place]) 11/15/2016  . Chest pain 10/27/2016  . CKD (chronic kidney disease) stage 5, GFR less than 15 ml/min (HCC) 10/27/2016  . Uncontrolled type 2 diabetes mellitus with hyperglycemia, without long-term current use of insulin ([place]) 10/27/2016  . Sensory disturbance 10/27/2016  . Left hemiparesis ([place]) 10/27/2016  . Acute on chronic renal failure ([place]) 05/25/2016  . Abdominal pain 04/26/2014  . Cholelithiasis 04/26/2014  . ARF (acute renal failure) ([place]) 04/26/2014  . Dehydration 04/26/2014  . Hyponatremia 04/26/2014  . Gallstones 04/06/2014  . Uncontrolled diabetes mellitus ([place]) 10/09/2013  . Breast abscess 05/30/2013  . Diabetes mellitus, type 2 ([place]) 05/30/2013  . Hyperglycemia 05/30/2013  . Internal hemorrhoids with other complication 83/38/2505  . Umbilical hernia without mention of obstruction or gangrene 04/15/2013  . Anemia 04/15/2013  . DM 05/13/2010  . Hyperlipidemia 05/13/2010  . OBESITY 05/13/2010  . Depression 05/13/2010  . RESTLESS LEG SYNDROME 05/13/2010  . Essential hypertension 05/13/2010  . GERD 05/13/2010  . RECTAL BLEEDING 05/13/2010  . Sleep apnea 05/13/2010  . Dysphagia, oropharyngeal phase 05/13/2010    Past Surgical History:  Procedure Laterality Date  . AV FISTULA PLACEMENT Right 01/27/2017   Procedure: ARTERIOVENOUS (AV) FISTULA CREATION-RIGHT ARM;  Surgeon: Elam Dutch, MD;  Location:  [facility] OR;  Service: Vascular;  Laterality: Right;  . BIOPSY N/A 05/17/2013   Procedure: BIOPSY;  Surgeon: [name], MD;  Location: [facility] ORS;  Service: Endoscopy;  Laterality: N/A;  . CHOLECYSTECTOMY N/A 04/27/2014   Procedure: LAPAROSCOPIC CHOLECYSTECTOMY;  Surgeon: [name], MD;  Location: [facility] ORS;  Service: General;  Laterality: N/A;  . COLONOSCOPY  06/06/2010   GEX:BMWUXL bleeding  secondary to internal hemorrhoids but incomplete evaluation secondary to poor right colon prep/small rectal and sigmoid colon polyps (hyperplastic). PROPOFOL  . COLONOSCOPY  May 2013   Dr. Gilliam/[facility]: 5 mm a descending colon polyp, hyperplastic. Adequate bowel prep.  . ENDOMETRIAL ABLATION    . ESOPHAGOGASTRODUODENOSCOPY  06/06/2010   KGM:WNUUVOZ erythema and edema of body of stomach, with sessile polypoid lesions. bx benign. no h.pylori  . ESOPHAGOGASTRODUODENOSCOPY (EGD) WITH PROPOFOL N/A 05/17/2013   Dr. Oneida Alar: normal esophagus, moderate nodular gastritis, negative path, empiric Savary dilation  . FLEXIBLE SIGMOIDOSCOPY N/A 05/17/2013   Dr. Oneida Alar: moderate sized internal hemorrhoids  . HEMORRHOID BANDING N/A 05/17/2013   Procedure: HEMORRHOID BANDING;  Surgeon: [name], MD;  Location: [facility] ORS;  Service: Endoscopy;  Laterality: N/A;  2 bands placed  . INCISION AND DRAINAGE ABSCESS Left 10/11/2013   Procedure: INCISION AND DRAINAGE AND DEBRIDEMENT LEFT BREAST  ABSCESS;  Surgeon: [name], MD;  Location: [facility] ORS;  Service: General;  Laterality: Left;  . IRRIGATION AND DEBRIDEMENT ABSCESS Right 06/01/2013   Procedure: INCISION AND DRAINAGE AND DEBRIDEMENT ABSCESS RIGHT BREAST;  Surgeon: [name], MD;  Location: [facility] ORS;  Service: General;  Laterality: Right;  . SAVORY DILATION N/A 05/17/2013   Procedure: SAVORY DILATION;  Surgeon: [name], MD;  Location: [facility] ORS;  Service: Endoscopy;  Laterality: N/A;  14/15/16  . TUBAL LIGATION      OB History    Gravida Para Term Preterm AB Living   2 2 1 1   2    SAB TAB Ectopic Multiple Live Births                   Home Medications    Prior to Admission medications   Medication Sig Start Date End Date Taking? Authorizing Provider  albuterol (PROVENTIL HFA;VENTOLIN HFA) 108 (90 Base) MCG/ACT inhaler Inhale 2 puffs into the lungs every 4 (four) hours as needed for wheezing or shortness of breath. 07/23/16   Francine Graven, DO  ALPRAZolam Duanne Moron) 1 MG tablet Take 1 mg by mouth 4 (four) times daily as needed for anxiety (nerves).    [provider]  atorvastatin (LIPITOR) 10 MG tablet Take 10 mg by mouth daily. Take along with 20 mg tablet to equal 30 mg daily.    [provider]  atorvastatin (LIPITOR) 20 MG tablet Take 20 mg by mouth daily. With Atorvastatin 10 mg to equal 30 mg total daily    [provider]  carvedilol (COREG) 12.5 MG tablet Take 1 tablet (12.5 mg total) by mouth 2 (two) times daily with a meal. 05/26/16   Cherene Altes, MD  cefdinir (OMNICEF) 300 MG capsule Take 1 capsule (300 mg total) by mouth daily. 04/10/17   Sinda Du, MD  colchicine 0.6 MG tablet Take 1 tablet (0.6 mg total) by mouth every other day. 11/19/16   Sinda Du, MD  fluticasone (FLONASE) 50 MCG/ACT nasal spray Place 2 sprays into both nostrils at bedtime.    [provider]  hydrALAZINE (APRESOLINE) 25 MG tablet Take 1 tablet (25  mg total) by mouth every 8 (eight) hours. 10/29/16   Sinda Du, MD  HYDROcodone-acetaminophen (NORCO) 10-325 MG tablet Take 1 tablet by mouth every 6 (six) hours as needed for moderate pain. 01/27/17   Rhyne, Hulen Shouts, PA-C  lisinopril-hydrochlorothiazide (PRINZIDE,ZESTORETIC) 20-12.5 MG tablet Take 1 tablet by mouth daily.    [provider]  loperamide (IMODIUM) 2 MG capsule Take 1 capsule (2 mg total) by mouth 4 (four) times daily as needed for diarrhea or loose stools. 10/23/16   Evalee Jefferson, PA-C  PARoxetine (PAXIL) 20 MG tablet Take 1 daily. This is to prevent panic attacks Patient taking differently: Take 20-40 mg by mouth daily. Take 1 daily. This is to prevent panic attacks 10/28/16   Sinda Du, MD  risperiDONE (RISPERDAL) 0.5 MG tablet Take 1 tablet (0.5 mg total) by mouth 2 (two) times daily. 12/01/16   Sinda Du, MD  rOPINIRole (REQUIP) 1 MG tablet Take 1 mg by mouth at bedtime.     [provider]    torsemide (DEMADEX) 20 MG tablet Take 3 tablets (60 mg total) by mouth daily. 04/10/17   Sinda Du, MD    Family History Family History  Problem Relation Age of Onset  . Adopted: Yes  . Family history unknown: Yes    Social History Social History  Substance Use Topics  . Smoking status: Former Smoker    Packs/day: 1.50    Years: 25.00    Types: Cigarettes    Quit date: 10/23/2007  . Smokeless tobacco: Never Used     Comment: Quit x 6 years ago  . Alcohol use No     Allergies   Codeine   Review of Systems Review of Systems  Constitutional: Negative for appetite change, fatigue and fever.  HENT: Negative for congestion, ear discharge and sinus pressure.   Eyes: Negative for discharge.  Respiratory: Positive for shortness of breath. Negative for cough.   Cardiovascular: Negative for chest pain and PND.  Gastrointestinal: Negative for abdominal pain and diarrhea.  Genitourinary: Negative for frequency and hematuria.  Musculoskeletal: Negative for back pain.  Skin: Negative for rash.  Neurological: Negative for seizures and headaches.  Psychiatric/Behavioral: Negative for hallucinations.     Physical Exam Updated Vital Signs BP (!) 162/94   Pulse 72   Temp 98 F (36.7 C) (Oral)   Resp (!) 23   Ht 5\' 8"  (1.727 m)   Wt (!) 165.6 kg (365 lb)   SpO2 97%   BMI 55.50 kg/m   Physical Exam  Constitutional: She is oriented to person, place, and time. She appears well-developed.  HENT:  Head: Normocephalic.  Eyes: Conjunctivae and EOM are normal. No scleral icterus.  Neck: Neck supple. No thyromegaly present.  Cardiovascular: Normal rate and regular rhythm.  Exam reveals no gallop and no friction rub.   No murmur heard. Pulmonary/Chest: No stridor. She has no wheezes. She has no rales. She exhibits no tenderness.  Abdominal: She exhibits no distension. There is no tenderness. There is no rebound.  Musculoskeletal: Normal range of motion. She exhibits edema.   Patient has anasarca. She has 3+ edema in her lower extremities. She also has edema in her abdomen and in her left breast.  Lymphadenopathy:    She has no cervical adenopathy.  Neurological: She is oriented to person, place, and time. She exhibits normal muscle tone. Coordination normal.  Skin: No rash noted. No erythema.  Psychiatric: She has a normal mood and affect. Her behavior is normal.  ED Treatments / Results  Labs (all labs ordered are listed, but only abnormal results are displayed) Labs Reviewed  CBC WITH DIFFERENTIAL/PLATELET - Abnormal; Notable for the following:       Result Value   RBC 3.49 (*)    Hemoglobin 9.1 (*)    HCT 29.8 (*)    RDW 17.6 (*)    Platelets 141 (*)    All other components within normal limits  COMPREHENSIVE METABOLIC PANEL - Abnormal; Notable for the following:    Glucose, Bld 114 (*)    BUN 61 (*)    Creatinine, Ser 5.37 (*)    Albumin 3.4 (*)    AST 11 (*)    GFR calc non Af Amer 9 (*)    GFR calc Af Amer 10 (*)    All other components within normal limits  BRAIN NATRIURETIC PEPTIDE - Abnormal; Notable for the following:    B Natriuretic Peptide 836.0 (*)    All other components within normal limits  URINALYSIS, ROUTINE W REFLEX MICROSCOPIC    EKG  EKG Interpretation  Date/Time:  Monday April 20 2017 15:38:35 EDT Ventricular Rate:  70 PR Interval:  140 QRS Duration: 72 QT Interval:  406 QTC Calculation: 438 R Axis:   3 Text Interpretation:  Normal sinus rhythm Normal ECG Confirmed by Milton Ferguson [phone]) on 04/20/2017 6:13:08 PM       Radiology Dg Chest 2 View  Result Date: 04/20/2017 CLINICAL DATA:  Shortness of breath. EXAM: CHEST  2 VIEW COMPARISON:  04/05/2017 FINDINGS: Low lung volumes with left base collapse/ consolidation and small pleural effusion. Right lung clear. Cardiopericardial silhouette is at upper limits of normal for size. The visualized bony structures of the thorax are intact. IMPRESSION:  Posterior left lower lobe collapse/consolidation with small effusion. Electronically Signed   By: Misty Stanley M.D.   On: 04/20/2017 16:28    Procedures Procedures (including critical care time)  Medications Ordered in ED Medications  furosemide (LASIX) 120 mg in dextrose 5 % 50 mL IVPB (120 mg Intravenous New Bag/Given 04/20/17 1904)     Initial Impression / Assessment and Plan / ED Course  I have reviewed the triage vital signs and the nursing notes.  Pertinent labs & imaging results that were available during my care of the patient were reviewed by me and considered in my medical decision making (see chart for details).     Patient with anasarca and congestive heart failure. She will be admitted for diuresis  Final Clinical Impressions(s) / ED Diagnoses   Final diagnoses:  Anasarca    New Prescriptions New Prescriptions   No medications on file     Milton Ferguson, MD 04/20/17 1916

## 2017-04-20 NOTE — ED Notes (Signed)
ED Provider at bedside. 

## 2017-04-20 NOTE — H&P (Signed)
TRH H&P    Patient Demographics:    Martha [name], is a 51 y.o. female  MRN: [id number]  DOB - 1965-08-04  Admit Date - 04/20/2017  Referring MD/NP/PA: Dr. Roderic Palau  Outpatient Primary MD for the patient is Sinda Du, MD  Patient coming from: Home  Chief Complaint  Patient presents with  . Shortness of Breath      HPI:    Martha [name]  is a 51 y.o. female, With history of CK D stage V, hyperlipidemia, hypertension, diabetes mellitus, sleep apnea, morbid opacity, COPD who came to hospital with worsening shortness of breath and fluid overload or posterolateral weeks. Patient was recently discharged from the hospital on 04/10/2017 on high-dose diuretics. Patient has AV fistula in right arm with  plan for initiation of hemodialysis next month. She denies chest pain, complains of dyspnea on exertion. She is making urine at home. Denies dysuria. Patient complains of diarrhea 2-3 bowel movements every day, she was recently discharged on antibiotics for UTI.  In the ED patient was found to be in acute pulmonary edema, and given 1 dose of IV Lasix 120 mg. She denies fever Denies dysuria No history of stroke or seizures Patient has gained 100 pounds over past 2 months    Review of systems:      All other systems reviewed and are negative.   With Past History of the following :    Past Medical History:  Diagnosis Date  . Acid reflux    takes Tums  . Anemia   . Arthritis   . Bipolar 1 disorder ([place])   . Cervical radiculopathy   . CHF (congestive heart failure) ([place])   . Chronic kidney disease    Stage 5- 01/25/17  . COPD (chronic obstructive pulmonary disease) ([place]) 2014   bronchitis  . Degenerative disc disease, thoracic   . Depression   . Diabetes mellitus    Type II  . Hypertension   . Noncompliance with medication regimen   . Obesity (BMI 30-39.9)   . OSA (obstructive sleep apnea)    cpap  . Panic attack   . RLS (restless legs syndrome)       Past Surgical History:  Procedure Laterality Date  . AV FISTULA PLACEMENT Right 01/27/2017   Procedure: ARTERIOVENOUS (AV) FISTULA CREATION-RIGHT ARM;  Surgeon: Elam Dutch, MD;  Location: [place];  Service: Vascular;  Laterality: Right;  . BIOPSY N/A 05/17/2013   Procedure: BIOPSY;  Surgeon: [name], MD;  Location: [facility];  Service: Endoscopy;  Laterality: N/A;  . CHOLECYSTECTOMY N/A 04/27/2014   Procedure: LAPAROSCOPIC CHOLECYSTECTOMY;  Surgeon: [name], MD;  Location: [facility];  Service: General;  Laterality: N/A;  . COLONOSCOPY  06/06/2010   RSW:NIOEVO bleeding secondary to internal hemorrhoids but incomplete evaluation secondary to poor right colon prep/small rectal and sigmoid colon polyps (hyperplastic). PROPOFOL  . COLONOSCOPY  May 2013   Dr. Gilliam/[facility]: 5 mm a descending colon polyp, hyperplastic. Adequate bowel prep.  . ENDOMETRIAL ABLATION    . ESOPHAGOGASTRODUODENOSCOPY  06/06/2010  WYO:VZCHYIF erythema and edema of body of stomach, with sessile polypoid lesions. bx benign. no h.pylori  . ESOPHAGOGASTRODUODENOSCOPY (EGD) WITH PROPOFOL N/A 05/17/2013   Dr. Oneida Alar: normal esophagus, moderate nodular gastritis, negative path, empiric Savary dilation  . FLEXIBLE SIGMOIDOSCOPY N/A 05/17/2013   Dr. Oneida Alar: moderate sized internal hemorrhoids  . HEMORRHOID BANDING N/A 05/17/2013   Procedure: HEMORRHOID BANDING;  Surgeon: [name], MD;  Location: [facility];  Service: Endoscopy;  Laterality: N/A;  2 bands placed  . INCISION AND DRAINAGE ABSCESS Left 10/11/2013   Procedure: INCISION AND DRAINAGE AND DEBRIDEMENT LEFT BREAST  ABSCESS;  Surgeon: [name], MD;  Location: [facility];  Service: General;  Laterality: Left;  . IRRIGATION AND DEBRIDEMENT ABSCESS Right 06/01/2013   Procedure: INCISION AND DRAINAGE AND DEBRIDEMENT ABSCESS RIGHT BREAST;  Surgeon: [name], MD;  Location: [facility];   Service: General;  Laterality: Right;  . SAVORY DILATION N/A 05/17/2013   Procedure: SAVORY DILATION;  Surgeon: [name], MD;  Location: [facility];  Service: Endoscopy;  Laterality: N/A;  14/15/16  . TUBAL LIGATION        Social History:      Social History  Substance Use Topics  . Smoking status: Former Smoker    Packs/day: 1.50    Years: 25.00    Types: Cigarettes    Quit date: 10/23/2007  . Smokeless tobacco: Never Used     Comment: Quit x 6 years ago  . Alcohol use No       Family History :     Family History  Problem Relation Age of Onset  . Adopted: Yes  . Family history unknown: Yes      Home Medications:   Prior to Admission medications   Medication Sig Start Date End Date Taking? Authorizing Provider  albuterol (PROVENTIL HFA;VENTOLIN HFA) 108 (90 Base) MCG/ACT inhaler Inhale 2 puffs into the lungs every 4 (four) hours as needed for wheezing or shortness of breath. 07/23/16   Francine Graven, DO  ALPRAZolam Duanne Moron) 1 MG tablet Take 1 mg by mouth 4 (four) times daily as needed for anxiety (nerves).    [provider]  atorvastatin (LIPITOR) 10 MG tablet Take 10 mg by mouth daily. Take along with 20 mg tablet to equal 30 mg daily.    [provider]  atorvastatin (LIPITOR) 20 MG tablet Take 20 mg by mouth daily. With Atorvastatin 10 mg to equal 30 mg total daily    [provider]  carvedilol (COREG) 12.5 MG tablet Take 1 tablet (12.5 mg total) by mouth 2 (two) times daily with a meal. 05/26/16   Cherene Altes, MD  cefdinir (OMNICEF) 300 MG capsule Take 1 capsule (300 mg total) by mouth daily. 04/10/17   Sinda Du, MD  colchicine 0.6 MG tablet Take 1 tablet (0.6 mg total) by mouth every other day. 11/19/16   Sinda Du, MD  fluticasone (FLONASE) 50 MCG/ACT nasal spray Place 2 sprays into both nostrils at bedtime.    [provider]  hydrALAZINE (APRESOLINE) 25 MG tablet Take 1 tablet (25 mg total) by mouth  every 8 (eight) hours. 10/29/16   Sinda Du, MD  HYDROcodone-acetaminophen (NORCO) 10-325 MG tablet Take 1 tablet by mouth every 6 (six) hours as needed for moderate pain. 01/27/17   Rhyne, Hulen Shouts, PA-C  lisinopril-hydrochlorothiazide (PRINZIDE,ZESTORETIC) 20-12.5 MG tablet Take 1 tablet by mouth daily.    [provider]  loperamide (IMODIUM) 2 MG capsule Take 1 capsule (2  mg total) by mouth 4 (four) times daily as needed for diarrhea or loose stools. 10/23/16   Evalee Jefferson, PA-C  PARoxetine (PAXIL) 20 MG tablet Take 1 daily. This is to prevent panic attacks Patient taking differently: Take 20-40 mg by mouth daily. Take 1 daily. This is to prevent panic attacks 10/28/16   Sinda Du, MD  risperiDONE (RISPERDAL) 0.5 MG tablet Take 1 tablet (0.5 mg total) by mouth 2 (two) times daily. 12/01/16   Sinda Du, MD  rOPINIRole (REQUIP) 1 MG tablet Take 1 mg by mouth at bedtime.     [provider]  torsemide (DEMADEX) 20 MG tablet Take 3 tablets (60 mg total) by mouth daily. 04/10/17   Sinda Du, MD     Allergies:     Allergies  Allergen Reactions  . Codeine Nausea And Vomiting     Physical Exam:   Vitals  Blood pressure (!) 167/103, pulse 72, temperature 98 F (36.7 C), temperature source Oral, resp. rate 19, height 5\' 8"  (1.727 m), weight (!) 165.6 kg (365 lb), SpO2 97 %.  1.  General: Appears in no acute distress  2. Psychiatric:  Intact judgement and  insight, awake alert, oriented x 3.  3. Neurologic: No focal neurological deficits, all cranial nerves intact.Strength 5/5 all 4 extremities, sensation intact all 4 extremities, plantars down going.  4. Eyes :  anicteric sclerae, moist conjunctivae with no lid lag. PERRLA.  5. ENMT:  Oropharynx clear with moist mucous membranes and good dentition  6. Neck:  supple, no cervical lymphadenopathy appriciated, No thyromegaly  7. Respiratory : Normal respiratory effort, good air movement  bilaterally,clear to  auscultation bilaterally  8. Cardiovascular : RRR, no gallops, rubs or murmurs, bilateral 2+ pitting edema of the lower extremities  9. Gastrointestinal:  Positive bowel sounds, abdomen soft, non-tender to palpation,no hepatosplenomegaly, no rigidity or guarding       10. Skin:  No cyanosis, normal texture and turgor, no rash, lesions or ulcers  11.Musculoskeletal:  Good muscle tone,  joints appear normal , no effusions,  normal range of motion    Data Review:    CBC  Recent Labs Lab 04/20/17 1606  WBC 4.3  HGB 9.1*  HCT 29.8*  PLT 141*  MCV 85.4  MCH 26.1  MCHC 30.5  RDW 17.6*  LYMPHSABS 1.2  MONOABS 0.4  EOSABS 0.2  BASOSABS 0.0   ------------------------------------------------------------------------------------------------------------------  Chemistries   Recent Labs Lab 04/20/17 1606  NA 142  K 4.7  CL 111  CO2 23  GLUCOSE 114*  BUN 61*  CREATININE 5.37*  CALCIUM 9.0  AST 11*  ALT 14  ALKPHOS 64  BILITOT 0.5   ------------------------------------------------------------------------------------------------------------------  ------------------------------------------------------------------------------------------------------------------ GFR: Estimated Creatinine Clearance: 20.9 mL/min (A) (by C-G formula based on SCr of 5.37 mg/dL (H)). Liver Function Tests:  Recent Labs Lab 04/20/17 1606  AST 11*  ALT 14  ALKPHOS 64  BILITOT 0.5  PROT 6.7  ALBUMIN 3.4*   No results for input(s): LIPASE, AMYLASE in the last 168 hours. No results for input(s): AMMONIA in the last 168 hours. Coagulation Profile: No results for input(s): INR, PROTIME in the last 168 hours. Cardiac Enzymes: No results for input(s): CKTOTAL, CKMB, CKMBINDEX, TROPONINI in the last 168 hours. BNP (last 3 results) No results for input(s): PROBNP in the last 8760 hours. HbA1C: No results for input(s): HGBA1C in the last 72 hours. CBG: No results for  input(s): GLUCAP in the last 168 hours. Lipid Profile: No results for input(s):  CHOL, HDL, LDLCALC, TRIG, CHOLHDL, LDLDIRECT in the last 72 hours. Thyroid Function Tests: No results for input(s): TSH, T4TOTAL, FREET4, T3FREE, THYROIDAB in the last 72 hours. Anemia Panel: No results for input(s): VITAMINB12, FOLATE, FERRITIN, TIBC, IRON, RETICCTPCT in the last 72 hours.  --------------------------------------------------------------------------------------------------------------- Urine analysis:    Component Value Date/Time   COLORURINE YELLOW 04/05/2017 1921   APPEARANCEUR HAZY (A) 04/05/2017 1921   LABSPEC 1.011 04/05/2017 1921   PHURINE 5.0 04/05/2017 1921   GLUCOSEU 150 (A) 04/05/2017 1921   HGBUR SMALL (A) 04/05/2017 1921   BILIRUBINUR NEGATIVE 04/05/2017 1921   KETONESUR NEGATIVE 04/05/2017 1921   PROTEINUR >=300 (A) 04/05/2017 1921   UROBILINOGEN 0.2 03/17/2015 2236   NITRITE POSITIVE (A) 04/05/2017 1921   LEUKOCYTESUR SMALL (A) 04/05/2017 1921      Imaging Results:    Dg Chest 2 View  Result Date: 04/20/2017 CLINICAL DATA:  Shortness of breath. EXAM: CHEST  2 VIEW COMPARISON:  04/05/2017 FINDINGS: Low lung volumes with left base collapse/ consolidation and small pleural effusion. Right lung clear. Cardiopericardial silhouette is at upper limits of normal for size. The visualized bony structures of the thorax are intact. IMPRESSION: Posterior left lower lobe collapse/consolidation with small effusion. Electronically Signed   By: Misty Stanley M.D.   On: 04/20/2017 16:28    My personal review of EKG: Rhythm NSR   Assessment & Plan:    Active Problems:   Anasarca associated with disorder of kidney   1. Anasarca-due to CK D stage V, patient already has av fistula , with plan for hemodialysis next month. Will give patient high-dose Lasix 120 mg IV every 12 hours. Consult nephrology in a.m. monitor strict intake and output. If no improvement with IV Lasix consider  hemodialysis in a.m. 2. Hypertension- will hold lisinopril/HCTZ, continue hydralazine, Coreg 3. Diabetes mellitus-patient is not on any medication for diabetes at home, start sliding scale insulin with NovoLog. 4. Diarrhea-patient completed antibiotics for UTI, will check stool for C. Difficile. 5. Obstructive sleep apnea-continue CPAP daily at bedtime 6. Anxiety-continue Xanax when necessary 7.    DVT Prophylaxis-   Heparin  AM Labs Ordered, also please review Full Orders  Family Communication: Admission, patients condition and plan of care including tests being ordered have been discussed with the patient  who indicate understanding and agree with the plan and Code Status.  Code Status:  Full code  Admission status: Observation    Time spent in minutes : 60 minutes   Aftyn Nott S M.D on 04/20/2017 at 7:43 PM  Between 7am to 7pm - Pager - [phone]. After 7pm go to [email or web address] - password [email or web address]  [facility] - Office  [phone]

## 2017-04-20 NOTE — ED Triage Notes (Signed)
Patient c/o shortness of breath and swelling to left side of body. Patient states recently discharged from hospital for "fluid" related to CHF and kidney failure 2 weeks ago. Patient states left breast swollen and tight. Patient states left lower abd pain. Denies any nausea or vomiting. Reports coughing and diarrhea. Patient also reports loss in vision to left eye x1 week. Patient reports weakness in right arm but states recently had fistula placed in arm for dialysis in which she starts next month.

## 2017-04-20 NOTE — ED Notes (Signed)
Patient applied on 2 LPM of oxygen via , sats increased to 98%.

## 2017-04-21 ENCOUNTER — Encounter (HOSPITAL_COMMUNITY): Payer: Self-pay | Admitting: *Deleted

## 2017-04-21 DIAGNOSIS — G4733 Obstructive sleep apnea (adult) (pediatric): Secondary | ICD-10-CM | POA: Diagnosis not present

## 2017-04-21 DIAGNOSIS — D538 Other specified nutritional anemias: Secondary | ICD-10-CM | POA: Diagnosis not present

## 2017-04-21 DIAGNOSIS — F319 Bipolar disorder, unspecified: Secondary | ICD-10-CM | POA: Diagnosis not present

## 2017-04-21 DIAGNOSIS — N049 Nephrotic syndrome with unspecified morphologic changes: Secondary | ICD-10-CM | POA: Diagnosis not present

## 2017-04-21 DIAGNOSIS — I10 Essential (primary) hypertension: Secondary | ICD-10-CM | POA: Diagnosis not present

## 2017-04-21 DIAGNOSIS — N185 Chronic kidney disease, stage 5: Secondary | ICD-10-CM | POA: Diagnosis not present

## 2017-04-21 LAB — GLUCOSE, CAPILLARY
GLUCOSE-CAPILLARY: 107 mg/dL — AB (ref 65–99)
GLUCOSE-CAPILLARY: 162 mg/dL — AB (ref 65–99)
GLUCOSE-CAPILLARY: 116 mg/dL — AB (ref 65–99)
Glucose-Capillary: 151 mg/dL — ABNORMAL HIGH (ref 65–99)

## 2017-04-21 LAB — COMPREHENSIVE METABOLIC PANEL
ALK PHOS: 56 U/L (ref 38–126)
ALT: 12 U/L — AB (ref 14–54)
AST: 11 U/L — AB (ref 15–41)
Albumin: 3.1 g/dL — ABNORMAL LOW (ref 3.5–5.0)
Anion gap: 9 (ref 5–15)
BILIRUBIN TOTAL: 0.6 mg/dL (ref 0.3–1.2)
BUN: 61 mg/dL — AB (ref 6–20)
CALCIUM: 8.9 mg/dL (ref 8.9–10.3)
CHLORIDE: 114 mmol/L — AB (ref 101–111)
CO2: 22 mmol/L (ref 22–32)
CREATININE: 5.49 mg/dL — AB (ref 0.44–1.00)
GFR, EST AFRICAN AMERICAN: 10 mL/min — AB (ref >60)
GFR, EST NON AFRICAN AMERICAN: 8 mL/min — AB (ref >60)
Glucose, Bld: 115 mg/dL — ABNORMAL HIGH (ref 65–99)
Potassium: 4.5 mmol/L (ref 3.5–5.1)
Sodium: 145 mmol/L (ref 135–145)
TOTAL PROTEIN: 6.1 g/dL — AB (ref 6.5–8.1)

## 2017-04-21 LAB — CBC
HEMATOCRIT: 29 % — AB (ref 36.0–46.0)
Hemoglobin: 8.7 g/dL — ABNORMAL LOW (ref 12.0–15.0)
MCH: 25.7 pg — AB (ref 26.0–34.0)
MCHC: 30 g/dL (ref 30.0–36.0)
MCV: 85.8 fL (ref 78.0–100.0)
PLATELETS: 138 10*3/uL — AB (ref 150–400)
RBC: 3.38 MIL/uL — AB (ref 3.87–5.11)
RDW: 17.6 % — ABNORMAL HIGH (ref 11.5–15.5)
WBC: 3.8 10*3/uL — AB (ref 4.0–10.5)

## 2017-04-21 LAB — HEMOGLOBIN A1C
Hgb A1c MFr Bld: 5.8 % — ABNORMAL HIGH (ref 4.8–5.6)
Mean Plasma Glucose: 119.76 mg/dL

## 2017-04-21 LAB — C DIFFICILE QUICK SCREEN W PCR REFLEX
C Diff antigen: POSITIVE — AB
C Diff toxin: NEGATIVE

## 2017-04-21 LAB — CLOSTRIDIUM DIFFICILE BY PCR: CDIFFPCR: NEGATIVE

## 2017-04-21 MED ORDER — LIDOCAINE HCL (PF) 1 % IJ SOLN: 5.0000 mL | INTRAMUSCULAR | Status: DC | PRN

## 2017-04-21 MED ORDER — HEPARIN SODIUM (PORCINE) 1000 UNIT/ML DIALYSIS
1000.0000 [IU] | INTRAMUSCULAR | Status: DC | PRN
  Administered 2017-04-23: 1000 [IU] via INTRAVENOUS_CENTRAL
  Filled 2017-04-21 (×2): qty 1

## 2017-04-21 MED ORDER — LIDOCAINE-PRILOCAINE 2.5-2.5 % EX CREA
1.0000 "application " | TOPICAL_CREAM | CUTANEOUS | Status: DC | PRN
  Filled 2017-04-21: qty 5

## 2017-04-21 MED ORDER — PENTAFLUOROPROP-TETRAFLUOROETH EX AERO: 1.0000 "application " | INHALATION_SPRAY | CUTANEOUS | Status: DC | PRN

## 2017-04-21 MED ORDER — ALTEPLASE 2 MG IJ SOLR
2.0000 mg | Freq: Once | INTRAMUSCULAR | Status: DC | PRN
  Filled 2017-04-21: qty 2

## 2017-04-21 MED ORDER — SODIUM CHLORIDE 0.9 % IV SOLN: 100.0000 mL | INTRAVENOUS | Status: DC | PRN

## 2017-04-21 MED ORDER — ORAL CARE MOUTH RINSE
15.0000 mL | Freq: Two times a day (BID) | OROMUCOSAL | Status: DC
  Administered 2017-04-21 – 2017-04-23 (×3): 15 mL via OROMUCOSAL

## 2017-04-21 MED ORDER — FUROSEMIDE 10 MG/ML IJ SOLN
INTRAMUSCULAR | Status: AC
  Filled 2017-04-21: qty 20

## 2017-04-21 NOTE — Progress Notes (Signed)
PROGRESS NOTE    Martha [name]  YDX:[id number] DOB: [birth year] DOA: 04/20/2017 PCP: Sinda Du, MD    Brief Narrative:  51 year old female with a history of chronic kidney disease stage V, presented to the hospital with worsening shortness of breath and evidence of volume overload. She was admitted to the hospital, started on intravenous diuretics. Nephrology has seen the patient started her on dialysis for volume removal.   Assessment & Plan:   Active Problems:   Hyperlipidemia   Morbid obesity ([place])   Essential hypertension   Sleep apnea   Anemia   Diabetes mellitus, type 2 ([facility])   CKD (chronic kidney disease) stage 5, GFR less than 15 ml/min ([facility])   Bipolar 1 disorder ([place])   Anasarca associated with disorder of kidney   1. Acute on chronic diastolic congestive heart failure. Precipitated by worsening renal failure. She is currently on intravenous Lasix. She's also been started on dialysis. She is still grossly volume overloaded. Continue volume removal. Monitor daily weights. Continue on beta blocker 2. Chronic kidney disease stage V. Nephrology following. She is continued on IV Lasix and has been started on dialysis. Will likely need to continue on regularly scheduled dialysis. Social work assisting in setting her up with the dialysis center. 3. Anemia. Suspect this is related to chronic kidney disease. No evidence of bleeding. Continue to monitor. 4. Bipolar 1 disorder. Continue on psychotropics including Risperdal 5. Hypertension. Blood pressure stable. Continue current treatments. 6. Obstructive Sleep apnea. Continue on CPAP 7. Morbid obesity. Counseled on importance of diet and exercise 8. Diarrhea. Patient tested positive for C. difficile antigen, negative for C. difficile toxin, and negative for C. difficile PCR. This does not indicate an active infection. Continue to treat supportively.   DVT prophylaxis: Heparin Code Status: Full code Family Communication: No  family present Disposition Plan: Discharge home once improved   Consultants:   Nephrology  Procedures:     Antimicrobials:       Subjective: Feeling better. Overall respiratory status is improving. Continues to have persistent diarrhea.  Objective: Vitals:   04/21/17 1330 04/21/17 1345 04/21/17 1350 04/21/17 1620  BP: 128/78 129/78 138/79 128/67  Pulse: 69 69 71   Resp:   20   Temp:   98 F (36.7 C)   TempSrc:   Oral   SpO2:      Weight:      Height:        Intake/Output Summary (Last 24 hours) at 04/21/17 1708 Last data filed at 04/21/17 1345  Gross per 24 hour  Intake              232 ml  Output             1600 ml  Net            -1368 ml   Filed Weights   04/20/17 2208 04/21/17 0555 04/21/17 1130  Weight: (!) 165.8 kg (365 lb 6.6 oz) (!) 165.9 kg (365 lb 10.8 oz) (!) 165.9 kg (365 lb 11.9 oz)    Examination:  General exam: Appears calm and comfortable  Respiratory system: Crackles at bases. Respiratory effort normal. Cardiovascular system: S1 & S2 heard, RRR. No JVD, murmurs, rubs, gallops or clicks. 2+ pedal edema. Gastrointestinal system: Abdomen is nondistended, soft and nontender. No organomegaly or masses felt. Normal bowel sounds heard. Central nervous system: Alert and oriented. No focal neurological deficits. Extremities: Symmetric 5 x 5 power. Skin: No rashes, lesions or ulcers Psychiatry: Judgement and  insight appear normal. Mood & affect appropriate.     Data Reviewed: I have personally reviewed following labs and imaging studies  CBC:  Recent Labs Lab 04/20/17 1606 04/21/17 0443  WBC 4.3 3.8*  NEUTROABS 2.5  --   HGB 9.1* 8.7*  HCT 29.8* 29.0*  MCV 85.4 85.8  PLT 141* 546*   Basic Metabolic Panel:  Recent Labs Lab 04/20/17 1606 04/21/17 0443  NA 142 145  K 4.7 4.5  CL 111 114*  CO2 23 22  GLUCOSE 114* 115*  BUN 61* 61*  CREATININE 5.37* 5.49*  CALCIUM 9.0 8.9   GFR: Estimated Creatinine Clearance: 20.5 mL/min  (A) (by C-G formula based on SCr of 5.49 mg/dL (H)). Liver Function Tests:  Recent Labs Lab 04/20/17 1606 04/21/17 0443  AST 11* 11*  ALT 14 12*  ALKPHOS 64 56  BILITOT 0.5 0.6  PROT 6.7 6.1*  ALBUMIN 3.4* 3.1*   No results for input(s): LIPASE, AMYLASE in the last 168 hours. No results for input(s): AMMONIA in the last 168 hours. Coagulation Profile: No results for input(s): INR, PROTIME in the last 168 hours. Cardiac Enzymes: No results for input(s): CKTOTAL, CKMB, CKMBINDEX, TROPONINI in the last 168 hours. BNP (last 3 results) No results for input(s): PROBNP in the last 8760 hours. HbA1C:  Recent Labs  04/20/17 2112  HGBA1C 5.8*   CBG:  Recent Labs Lab 04/21/17 0734 04/21/17 1123 04/21/17 1622  GLUCAP 107* 162* 116*   Lipid Profile: No results for input(s): CHOL, HDL, LDLCALC, TRIG, CHOLHDL, LDLDIRECT in the last 72 hours. Thyroid Function Tests: No results for input(s): TSH, T4TOTAL, FREET4, T3FREE, THYROIDAB in the last 72 hours. Anemia Panel: No results for input(s): VITAMINB12, FOLATE, FERRITIN, TIBC, IRON, RETICCTPCT in the last 72 hours. Sepsis Labs: No results for input(s): PROCALCITON, LATICACIDVEN in the last 168 hours.  Recent Results (from the past 240 hour(s))  C difficile quick scan w PCR reflex     Status: Abnormal   Collection Time: 04/21/17  9:00 AM  Result Value Ref Range Status   C Diff antigen POSITIVE (A) NEGATIVE Final   C Diff toxin NEGATIVE NEGATIVE Final   C Diff interpretation Results are indeterminate. See PCR results.  Final  Clostridium Difficile by PCR     Status: None   Collection Time: 04/21/17  9:00 AM  Result Value Ref Range Status   Toxigenic C Difficile by pcr NEGATIVE NEGATIVE Final    Comment: Patient is colonized with non toxigenic C. difficile. May not need treatment unless significant symptoms are present. Performed at [facility], [place] [address], [place], [place] [address]          Radiology  Studies: Dg Chest 2 View  Result Date: 04/20/2017 CLINICAL DATA:  Shortness of breath. EXAM: CHEST  2 VIEW COMPARISON:  04/05/2017 FINDINGS: Low lung volumes with left base collapse/ consolidation and small pleural effusion. Right lung clear. Cardiopericardial silhouette is at upper limits of normal for size. The visualized bony structures of the thorax are intact. IMPRESSION: Posterior left lower lobe collapse/consolidation with small effusion. Electronically Signed   By: Misty Stanley M.D.   On: 04/20/2017 16:28        Scheduled Meds: . atorvastatin  30 mg Oral q1800  . carvedilol  12.5 mg Oral BID WC  . heparin  5,000 Units Subcutaneous Q8H  . hydrALAZINE  25 mg Oral Q8H  . insulin aspart  0-9 Units Subcutaneous TID WC  . mouth rinse  15 mL Mouth Rinse BID  . PARoxetine  20 mg Oral Daily  . risperiDONE  0.5 mg Oral BID  . rOPINIRole  1 mg Oral QHS  . sodium chloride flush  3 mL Intravenous Q12H   Continuous Infusions: . sodium chloride    . sodium chloride    . sodium chloride    . furosemide Stopped (04/21/17 0835)     LOS: 0 days    Time spent: 48mins    MEMON,JEHANZEB, MD [facility] Pager [phone]  If 7PM-7AM, please contact night-coverage [email or web address] Password [email or web address] 04/21/2017, 5:08 PM

## 2017-04-21 NOTE — Consult Note (Signed)
Reason for Consult: Chronic renal failure and fluid overload. Referring Physician: Dr. [name] is an 51 y.o. female.  HPI: She is a patient was history of hypertension, diabetes, COPD, sleep apnea and chronic renal failure stage V presently came with complaints of increased leg swelling, anasarca and difficulty breathing the last couple of days. Patient has been admitted multiple times with similar issue. When she was evaluated patient was found to have significant anasarca and worsening of her renal failure and is admitted to the hospital. Patient complains of also some cough but no sputum production. She denies any nausea or vomiting. Appetite is good.  Past Medical History:  Diagnosis Date  . Acid reflux    takes Tums  . Anemia   . Arthritis   . Bipolar 1 disorder ([place])   . Cervical radiculopathy   . CHF (congestive heart failure) ([place])   . Chronic kidney disease    Stage 5- 01/25/17  . COPD (chronic obstructive pulmonary disease) ([place]) 2014   bronchitis  . Degenerative disc disease, thoracic   . Depression   . Diabetes mellitus    Type II  . Hypertension   . Noncompliance with medication regimen   . Obesity (BMI 30-39.9)   . OSA (obstructive sleep apnea)    cpap  . Panic attack   . RLS (restless legs syndrome)     Past Surgical History:  Procedure Laterality Date  . AV FISTULA PLACEMENT Right 01/27/2017   Procedure: ARTERIOVENOUS (AV) FISTULA CREATION-RIGHT ARM;  Surgeon: Elam Dutch, MD;  Location: [facility];  Service: Vascular;  Laterality: Right;  . BIOPSY N/A 05/17/2013   Procedure: BIOPSY;  Surgeon: [name], MD;  Location: [facility];  Service: Endoscopy;  Laterality: N/A;  . CHOLECYSTECTOMY N/A 04/27/2014   Procedure: LAPAROSCOPIC CHOLECYSTECTOMY;  Surgeon: [name], MD;  Location: [facility];  Service: General;  Laterality: N/A;  . COLONOSCOPY  06/06/2010   EHU:DJSHFW bleeding secondary to internal hemorrhoids but incomplete evaluation  secondary to poor right colon prep/small rectal and sigmoid colon polyps (hyperplastic). PROPOFOL  . COLONOSCOPY  May 2013   Dr. Gilliam/[facility]: 5 mm a descending colon polyp, hyperplastic. Adequate bowel prep.  . ENDOMETRIAL ABLATION    . ESOPHAGOGASTRODUODENOSCOPY  06/06/2010   YOV:ZCHYIFO erythema and edema of body of stomach, with sessile polypoid lesions. bx benign. no h.pylori  . ESOPHAGOGASTRODUODENOSCOPY (EGD) WITH PROPOFOL N/A 05/17/2013   Dr. Oneida Alar: normal esophagus, moderate nodular gastritis, negative path, empiric Savary dilation  . FLEXIBLE SIGMOIDOSCOPY N/A 05/17/2013   Dr. Oneida Alar: moderate sized internal hemorrhoids  . HEMORRHOID BANDING N/A 05/17/2013   Procedure: HEMORRHOID BANDING;  Surgeon: [name], MD;  Location: [facility];  Service: Endoscopy;  Laterality: N/A;  2 bands placed  . INCISION AND DRAINAGE ABSCESS Left 10/11/2013   Procedure: INCISION AND DRAINAGE AND DEBRIDEMENT LEFT BREAST  ABSCESS;  Surgeon: [name], MD;  Location: [facility];  Service: General;  Laterality: Left;  . IRRIGATION AND DEBRIDEMENT ABSCESS Right 06/01/2013   Procedure: INCISION AND DRAINAGE AND DEBRIDEMENT ABSCESS RIGHT BREAST;  Surgeon: [name], MD;  Location: [facility];  Service: General;  Laterality: Right;  . SAVORY DILATION N/A 05/17/2013   Procedure: SAVORY DILATION;  Surgeon: [name], MD;  Location: [facility];  Service: Endoscopy;  Laterality: N/A;  14/15/16  . TUBAL LIGATION      Family History  Problem Relation Age of Onset  . Adopted: Yes  . Family history unknown:  Yes    Social History:  reports that she quit smoking about 9 years ago. Her smoking use included Cigarettes. She has a 37.50 pack-year smoking history. She has never used smokeless tobacco. She reports that she does not drink alcohol or use drugs.  Allergies:  Allergies  Allergen Reactions  . Codeine Nausea And Vomiting    Medications: I have reviewed the patient's current  medications.  Results for orders placed or performed during the hospital encounter of 04/20/17 (from the past 48 hour(s))  CBC with Differential     Status: Abnormal   Collection Time: 04/20/17  4:06 PM  Result Value Ref Range   WBC 4.3 4.0 - 10.5 K/uL   RBC 3.49 (L) 3.87 - 5.11 MIL/uL   Hemoglobin 9.1 (L) 12.0 - 15.0 g/dL   HCT 29.8 (L) 36.0 - 46.0 %   MCV 85.4 78.0 - 100.0 fL   MCH 26.1 26.0 - 34.0 pg   MCHC 30.5 30.0 - 36.0 g/dL   RDW 17.6 (H) 11.5 - 15.5 %   Platelets 141 (L) 150 - 400 K/uL   Neutrophils Relative % 59 %   Neutro Abs 2.5 1.7 - 7.7 K/uL   Lymphocytes Relative 28 %   Lymphs Abs 1.2 0.7 - 4.0 K/uL   Monocytes Relative 8 %   Monocytes Absolute 0.4 0.1 - 1.0 K/uL   Eosinophils Relative 4 %   Eosinophils Absolute 0.2 0.0 - 0.7 K/uL   Basophils Relative 1 %   Basophils Absolute 0.0 0.0 - 0.1 K/uL  Comprehensive metabolic panel     Status: Abnormal   Collection Time: 04/20/17  4:06 PM  Result Value Ref Range   Sodium 142 135 - 145 mmol/L   Potassium 4.7 3.5 - 5.1 mmol/L   Chloride 111 101 - 111 mmol/L   CO2 23 22 - 32 mmol/L   Glucose, Bld 114 (H) 65 - 99 mg/dL   BUN 61 (H) 6 - 20 mg/dL   Creatinine, Ser 5.37 (H) 0.44 - 1.00 mg/dL   Calcium 9.0 8.9 - 10.3 mg/dL   Total Protein 6.7 6.5 - 8.1 g/dL   Albumin 3.4 (L) 3.5 - 5.0 g/dL   AST 11 (L) 15 - 41 U/L   ALT 14 14 - 54 U/L   Alkaline Phosphatase 64 38 - 126 U/L   Total Bilirubin 0.5 0.3 - 1.2 mg/dL   GFR calc non Af Amer 9 (L) >60 mL/min   GFR calc Af Amer 10 (L) >60 mL/min    Comment: (NOTE) The eGFR has been calculated using the CKD EPI equation. This calculation has not been validated in all clinical situations. eGFR's persistently <60 mL/min signify possible Chronic Kidney Disease.    Anion gap 8 5 - 15  Brain natriuretic peptide     Status: Abnormal   Collection Time: 04/20/17  4:06 PM  Result Value Ref Range   B Natriuretic Peptide 836.0 (H) 0.0 - 100.0 pg/mL  CBC     Status: Abnormal    Collection Time: 04/21/17  4:43 AM  Result Value Ref Range   WBC 3.8 (L) 4.0 - 10.5 K/uL   RBC 3.38 (L) 3.87 - 5.11 MIL/uL   Hemoglobin 8.7 (L) 12.0 - 15.0 g/dL   HCT 29.0 (L) 36.0 - 46.0 %   MCV 85.8 78.0 - 100.0 fL   MCH 25.7 (L) 26.0 - 34.0 pg   MCHC 30.0 30.0 - 36.0 g/dL   RDW 17.6 (H) 11.5 - 15.5 %  Platelets 138 (L) 150 - 400 K/uL  Comprehensive metabolic panel     Status: Abnormal   Collection Time: 04/21/17  4:43 AM  Result Value Ref Range   Sodium 145 135 - 145 mmol/L   Potassium 4.5 3.5 - 5.1 mmol/L   Chloride 114 (H) 101 - 111 mmol/L   CO2 22 22 - 32 mmol/L   Glucose, Bld 115 (H) 65 - 99 mg/dL   BUN 61 (H) 6 - 20 mg/dL   Creatinine, Ser 5.49 (H) 0.44 - 1.00 mg/dL   Calcium 8.9 8.9 - 10.3 mg/dL   Total Protein 6.1 (L) 6.5 - 8.1 g/dL   Albumin 3.1 (L) 3.5 - 5.0 g/dL   AST 11 (L) 15 - 41 U/L   ALT 12 (L) 14 - 54 U/L   Alkaline Phosphatase 56 38 - 126 U/L   Total Bilirubin 0.6 0.3 - 1.2 mg/dL   GFR calc non Af Amer 8 (L) >60 mL/min   GFR calc Af Amer 10 (L) >60 mL/min    Comment: (NOTE) The eGFR has been calculated using the CKD EPI equation. This calculation has not been validated in all clinical situations. eGFR's persistently <60 mL/min signify possible Chronic Kidney Disease.    Anion gap 9 5 - 15  Glucose, capillary     Status: Abnormal   Collection Time: 04/21/17  7:34 AM  Result Value Ref Range   Glucose-Capillary 107 (H) 65 - 99 mg/dL    Dg Chest 2 View  Result Date: 04/20/2017 CLINICAL DATA:  Shortness of breath. EXAM: CHEST  2 VIEW COMPARISON:  04/05/2017 FINDINGS: Low lung volumes with left base collapse/ consolidation and small pleural effusion. Right lung clear. Cardiopericardial silhouette is at upper limits of normal for size. The visualized bony structures of the thorax are intact. IMPRESSION: Posterior left lower lobe collapse/consolidation with small effusion. Electronically Signed   By: Misty Stanley M.D.   On: 04/20/2017 16:28    Review of  Systems  Constitutional: Negative for chills and fever.  Respiratory: Positive for cough, shortness of breath and wheezing. Negative for sputum production.   Cardiovascular: Positive for leg swelling. Negative for chest pain.  Gastrointestinal: Negative for nausea and vomiting.  Neurological: Positive for weakness.   Blood pressure (!) 144/64, pulse 82, temperature 98.5 F (36.9 C), temperature source Oral, resp. rate 20, height _0  (1.727 m), weight (!) 165.9 kg (365 lb 10.8 oz), SpO2 100 %. Physical Exam  Constitutional: She is oriented to person, place, and time. No distress.  Eyes: No scleral icterus.  Neck: No JVD present.  Cardiovascular: Normal rate and regular rhythm.   Respiratory: No respiratory distress. She has no wheezes. She has rales.  Decrease breath sound bilaterally Patient also increasing edema of her left breast and enlargement  GI: She exhibits distension. There is no tenderness. There is no rebound.  Musculoskeletal: She exhibits edema.  Neurological: She is alert and oriented to person, place, and time.    Assessment/Plan: Problem #1. Difficulty in breathing: At this moment seems to be multifactorial. A combination of COPD/sleep apnea/fluid overload/questionable consolidation or collapse with left lower lobe. Presently patient is sitting by bedside and seems to be comfortable. Problem #2 chronic renal failure: Stage V. Her BUN and creatinine is progressively increasing. Patient however denies any nausea or vomiting. Problem #3 anemia: Her hemoglobin is below target goal. Most likely secondary to chronic renal failure Problem #4 diabetes Problem #5 Bone and mineral disorder: Her calcium is a range but  phosphorus is not available. Patient on a binder Problem #6 obesity Problem #7 hypertension: Her blood pressure is reasonably controlled Problem #8 anasarca: Patient was cutaneous edema of her abdomen and left breast Plan: Will start patient on dialysis today and  will dialyze her for 2 hours 2] will remove about a liter 3] will dialyze patient again tomorrow and possibly remove about 3 L. 4] will check her CBC and renal panel in the morning 5] will continue with Lasix IV.  Haralambos Yeatts S 04/21/2017, 8:09 AM

## 2017-04-21 NOTE — Clinical Social Work Note (Signed)
LCSW met with patient and discussed new dialysis start. LCSW faxed admission intake form to [facility] central intake.       Marlane Hirschmann, Clydene Pugh, LCSW

## 2017-04-21 NOTE — Care Management Note (Signed)
Case Management Note  Patient Details  Name: Martha [name] MRN: [id number] Date of Birth: [birth year]  Subjective/Objective:    Pt admitted anasarca.  History of chronic renal failure stage V, has AV fistula in right arm with plans to initiate HD next month. Will receive hemodialysis today. Pt from home, lives with SO. She has PCP and nephrologist. She does not drive, S.O. gets her to appointments. She has CPAP she uses at home.   Action/Plan:Anticipate DC home with self care. CSW consulted for possible dialysis chair setup.   Expected Discharge Date:       04/23/2017           Expected Discharge Plan:  Home/Self Care  In-House Referral:     Discharge planning Services  CM Consult  Post Acute Care Choice:    Choice offered to:     DME Arranged:    DME Agency:     HH Arranged:    HH Agency:     Status of Service:  In process, will continue to follow  If discussed at Long Length of Stay Meetings, dates discussed:    Additional Comments:  Martha [name], Chauncey Reading, RN 04/21/2017, 1:29 PM

## 2017-04-22 DIAGNOSIS — N186 End stage renal disease: Secondary | ICD-10-CM | POA: Diagnosis present

## 2017-04-22 DIAGNOSIS — I10 Essential (primary) hypertension: Secondary | ICD-10-CM

## 2017-04-22 DIAGNOSIS — E1122 Type 2 diabetes mellitus with diabetic chronic kidney disease: Secondary | ICD-10-CM | POA: Diagnosis present

## 2017-04-22 DIAGNOSIS — D631 Anemia in chronic kidney disease: Secondary | ICD-10-CM | POA: Diagnosis present

## 2017-04-22 DIAGNOSIS — R0602 Shortness of breath: Secondary | ICD-10-CM | POA: Diagnosis present

## 2017-04-22 DIAGNOSIS — Z794 Long term (current) use of insulin: Secondary | ICD-10-CM | POA: Diagnosis not present

## 2017-04-22 DIAGNOSIS — F329 Major depressive disorder, single episode, unspecified: Secondary | ICD-10-CM | POA: Diagnosis present

## 2017-04-22 DIAGNOSIS — G4733 Obstructive sleep apnea (adult) (pediatric): Secondary | ICD-10-CM

## 2017-04-22 DIAGNOSIS — E782 Mixed hyperlipidemia: Secondary | ICD-10-CM

## 2017-04-22 DIAGNOSIS — F319 Bipolar disorder, unspecified: Secondary | ICD-10-CM

## 2017-04-22 DIAGNOSIS — N049 Nephrotic syndrome with unspecified morphologic changes: Secondary | ICD-10-CM

## 2017-04-22 DIAGNOSIS — Z6841 Body Mass Index (BMI) 40.0 and over, adult: Secondary | ICD-10-CM | POA: Diagnosis not present

## 2017-04-22 DIAGNOSIS — Z992 Dependence on renal dialysis: Secondary | ICD-10-CM

## 2017-04-22 DIAGNOSIS — I132 Hypertensive heart and chronic kidney disease with heart failure and with stage 5 chronic kidney disease, or end stage renal disease: Secondary | ICD-10-CM | POA: Diagnosis present

## 2017-04-22 DIAGNOSIS — Z79899 Other long term (current) drug therapy: Secondary | ICD-10-CM | POA: Diagnosis not present

## 2017-04-22 DIAGNOSIS — E1165 Type 2 diabetes mellitus with hyperglycemia: Secondary | ICD-10-CM

## 2017-04-22 DIAGNOSIS — E785 Hyperlipidemia, unspecified: Secondary | ICD-10-CM | POA: Diagnosis present

## 2017-04-22 DIAGNOSIS — N179 Acute kidney failure, unspecified: Secondary | ICD-10-CM | POA: Diagnosis present

## 2017-04-22 DIAGNOSIS — K219 Gastro-esophageal reflux disease without esophagitis: Secondary | ICD-10-CM | POA: Diagnosis present

## 2017-04-22 DIAGNOSIS — J449 Chronic obstructive pulmonary disease, unspecified: Secondary | ICD-10-CM | POA: Diagnosis present

## 2017-04-22 DIAGNOSIS — N2581 Secondary hyperparathyroidism of renal origin: Secondary | ICD-10-CM | POA: Diagnosis present

## 2017-04-22 DIAGNOSIS — G2581 Restless legs syndrome: Secondary | ICD-10-CM | POA: Diagnosis present

## 2017-04-22 DIAGNOSIS — N185 Chronic kidney disease, stage 5: Secondary | ICD-10-CM | POA: Diagnosis not present

## 2017-04-22 DIAGNOSIS — I5033 Acute on chronic diastolic (congestive) heart failure: Secondary | ICD-10-CM | POA: Diagnosis present

## 2017-04-22 DIAGNOSIS — Z87891 Personal history of nicotine dependence: Secondary | ICD-10-CM | POA: Diagnosis not present

## 2017-04-22 DIAGNOSIS — N189 Chronic kidney disease, unspecified: Secondary | ICD-10-CM | POA: Diagnosis present

## 2017-04-22 LAB — RENAL FUNCTION PANEL
ALBUMIN: 2.9 g/dL — AB (ref 3.5–5.0)
ANION GAP: 9 (ref 5–15)
BUN: 49 mg/dL — ABNORMAL HIGH (ref 6–20)
CO2: 25 mmol/L (ref 22–32)
Calcium: 8.7 mg/dL — ABNORMAL LOW (ref 8.9–10.3)
Chloride: 108 mmol/L (ref 101–111)
Creatinine, Ser: 4.85 mg/dL — ABNORMAL HIGH (ref 0.44–1.00)
GFR calc Af Amer: 11 mL/min — ABNORMAL LOW (ref >60)
GFR, EST NON AFRICAN AMERICAN: 10 mL/min — AB (ref >60)
GLUCOSE: 133 mg/dL — AB (ref 65–99)
PHOSPHORUS: 5.4 mg/dL — AB (ref 2.5–4.6)
POTASSIUM: 4.2 mmol/L (ref 3.5–5.1)
Sodium: 142 mmol/L (ref 135–145)

## 2017-04-22 LAB — URINALYSIS, ROUTINE W REFLEX MICROSCOPIC
BILIRUBIN URINE: NEGATIVE
Glucose, UA: NEGATIVE mg/dL
Hgb urine dipstick: NEGATIVE
KETONES UR: NEGATIVE mg/dL
LEUKOCYTES UA: NEGATIVE
Nitrite: NEGATIVE
PROTEIN: 100 mg/dL — AB
SPECIFIC GRAVITY, URINE: 1.008 (ref 1.005–1.030)
pH: 5 (ref 5.0–8.0)

## 2017-04-22 LAB — CBC
HEMATOCRIT: 27.4 % — AB (ref 36.0–46.0)
HEMOGLOBIN: 8 g/dL — AB (ref 12.0–15.0)
MCH: 25.5 pg — ABNORMAL LOW (ref 26.0–34.0)
MCHC: 29.2 g/dL — AB (ref 30.0–36.0)
MCV: 87.3 fL (ref 78.0–100.0)
Platelets: 137 10*3/uL — ABNORMAL LOW (ref 150–400)
RBC: 3.14 MIL/uL — ABNORMAL LOW (ref 3.87–5.11)
RDW: 17.2 % — ABNORMAL HIGH (ref 11.5–15.5)
WBC: 3.8 10*3/uL — ABNORMAL LOW (ref 4.0–10.5)

## 2017-04-22 LAB — GLUCOSE, CAPILLARY
GLUCOSE-CAPILLARY: 125 mg/dL — AB (ref 65–99)
GLUCOSE-CAPILLARY: 150 mg/dL — AB (ref 65–99)
Glucose-Capillary: 116 mg/dL — ABNORMAL HIGH (ref 65–99)
Glucose-Capillary: 136 mg/dL — ABNORMAL HIGH (ref 65–99)

## 2017-04-22 LAB — HEPATITIS B SURFACE ANTIGEN: Hepatitis B Surface Ag: NEGATIVE

## 2017-04-22 MED ORDER — SODIUM CHLORIDE 0.9 % IV SOLN: 100.0000 mL | INTRAVENOUS | Status: DC | PRN

## 2017-04-22 MED ORDER — HEPARIN SODIUM (PORCINE) 1000 UNIT/ML DIALYSIS
1000.0000 [IU] | INTRAMUSCULAR | Status: DC | PRN
  Filled 2017-04-22: qty 1

## 2017-04-22 MED ORDER — TUBERCULIN PPD 5 UNIT/0.1ML ID SOLN
5.0000 [IU] | Freq: Once | INTRADERMAL | Status: AC
  Administered 2017-04-22: 5 [IU] via INTRADERMAL
  Filled 2017-04-22: qty 0.1

## 2017-04-22 MED ORDER — MUPIROCIN CALCIUM 2 % EX CREA
TOPICAL_CREAM | Freq: Two times a day (BID) | CUTANEOUS | Status: AC
  Administered 2017-04-22 – 2017-04-24 (×6): via TOPICAL
  Filled 2017-04-22: qty 15

## 2017-04-22 MED ORDER — ALTEPLASE 2 MG IJ SOLR
2.0000 mg | Freq: Once | INTRAMUSCULAR | Status: DC | PRN
  Filled 2017-04-22: qty 2

## 2017-04-22 MED ORDER — LIDOCAINE-PRILOCAINE 2.5-2.5 % EX CREA: 1.0000 "application " | TOPICAL_CREAM | CUTANEOUS | Status: DC | PRN

## 2017-04-22 MED ORDER — PENTAFLUOROPROP-TETRAFLUOROETH EX AERO: 1.0000 "application " | INHALATION_SPRAY | CUTANEOUS | Status: DC | PRN

## 2017-04-22 MED ORDER — LIDOCAINE HCL (PF) 1 % IJ SOLN: 5.0000 mL | INTRAMUSCULAR | Status: DC | PRN

## 2017-04-22 NOTE — Progress Notes (Signed)
PROGRESS NOTE    Martha [name]  WUJ:[id number] DOB: 1966-06-26 DOA: 04/20/2017 PCP: Sinda Du, MD    Brief Narrative:  51 year old female with a history of chronic kidney disease stage V, presented to the hospital with worsening shortness of breath and evidence of volume overload. She was admitted to the hospital, started on intravenous diuretics. Nephrology has seen the patient started her on dialysis for volume removal.  Assessment & Plan:   Active Problems:   Hyperlipidemia   Morbid obesity ([place])   Essential hypertension   Sleep apnea   Anemia   Diabetes mellitus, type 2 (HCC)   CKD (chronic kidney disease) stage 5, GFR less than 15 ml/min (H[facility])   Bipolar 1 disorder ([place])   Anasarca associated with disorder of kidney   1. Acute on chronic diastolic congestive heart failure. Precipitated by worsening renal failure. She is currently on intravenous Lasix. She's been started on dialysis. She remains volume overloaded but much improved. Continue volume removal. Monitor daily weights. Continue on beta blocker 2. Chronic kidney disease stage V. Nephrology following. She is continued on IV Lasix and has been started on dialysis. Will likely need to continue on regularly scheduled dialysis. Social work assisting in setting her up with the dialysis center. Hep B serology pending.  3. Anemia. Suspect this is related to chronic kidney disease. No evidence of bleeding. Continue to monitor.  4. Bipolar 1 disorder. Continue psychotropics.  5. Hypertension. Blood pressure stable. Continue current treatments. 6. Obstructive Sleep apnea. Continue on CPAP 7. Morbid obesity. Counseled on importance of diet and exercise 8. Diarrhea. Patient tested positive for C. difficile antigen, negative for C. difficile toxin, and negative for C. difficile PCR. This does not indicate an active infection. Continue to treat supportively.  DVT prophylaxis: Heparin Code Status: Full code Family Communication:  No family present Disposition Plan: Discharge home once improved  Consultants:   Nephrology  Subjective: Pt says that the loose stools are getting better, she is feeling better.   Objective: Vitals:   04/21/17 1620 04/21/17 1954 04/21/17 2025 04/22/17 0623  BP: 128/67  (!) 150/64 (!) 117/58  Pulse:   87 78  Resp:   18 18  Temp:   98.4 F (36.9 C) (!) 97.4 F (36.3 C)  TempSrc:   Oral Oral  SpO2:  93% 92% 90%  Weight:      Height:        Intake/Output Summary (Last 24 hours) at 04/22/17 1014 Last data filed at 04/22/17 9562  Gross per 24 hour  Intake              360 ml  Output             1850 ml  Net            -1490 ml   Filed Weights   04/20/17 2208 04/21/17 0555 04/21/17 1130  Weight: (!) 165.8 kg (365 lb 6.6 oz) (!) 165.9 kg (365 lb 10.8 oz) (!) 165.9 kg (365 lb 11.9 oz)    Examination:  General exam: Appears calm and comfortable NAD Cooperative. Respiratory system:  Respiratory effort normal. Cardiovascular system: S1 & S2 heard, RRR. No JVD, murmurs, rubs, gallops or clicks. 2+ pedal edema. Gastrointestinal system: Abdomen is nondistended, soft and nontender. No organomegaly or masses felt. Normal bowel sounds heard. Central nervous system: Alert and oriented. No focal neurological deficits. Extremities: 2+ pitting edema BLEs.  Symmetric 5 x 5 power. Skin: No rashes, lesions or ulcers Psychiatry: Judgement  and insight appear normal. Mood & affect appropriate.   Data Reviewed: I have personally reviewed following labs and imaging studies  CBC:  Recent Labs Lab 04/20/17 1606 04/21/17 0443 04/22/17 0517  WBC 4.3 3.8* 3.8*  NEUTROABS 2.5  --   --   HGB 9.1* 8.7* 8.0*  HCT 29.8* 29.0* 27.4*  MCV 85.4 85.8 87.3  PLT 141* 138* 622*   Basic Metabolic Panel:  Recent Labs Lab 04/20/17 1606 04/21/17 0443 04/22/17 0517  NA 142 145 142  K 4.7 4.5 4.2  CL 111 114* 108  CO2 23 22 25   GLUCOSE 114* 115* 133*  BUN 61* 61* 49*  CREATININE 5.37* 5.49*  4.85*  CALCIUM 9.0 8.9 8.7*  PHOS  --   --  5.4*   GFR: Estimated Creatinine Clearance: 23.2 mL/min (A) (by C-G formula based on SCr of 4.85 mg/dL (H)). Liver Function Tests:  Recent Labs Lab 04/20/17 1606 04/21/17 0443 04/22/17 0517  AST 11* 11*  --   ALT 14 12*  --   ALKPHOS 64 56  --   BILITOT 0.5 0.6  --   PROT 6.7 6.1*  --   ALBUMIN 3.4* 3.1* 2.9*   No results for input(s): LIPASE, AMYLASE in the last 168 hours. No results for input(s): AMMONIA in the last 168 hours. Coagulation Profile: No results for input(s): INR, PROTIME in the last 168 hours. Cardiac Enzymes: No results for input(s): CKTOTAL, CKMB, CKMBINDEX, TROPONINI in the last 168 hours. BNP (last 3 results) No results for input(s): PROBNP in the last 8760 hours. HbA1C:  Recent Labs  04/20/17 2112  HGBA1C 5.8*   CBG:  Recent Labs Lab 04/21/17 0734 04/21/17 1123 04/21/17 1622 04/21/17 2024 04/22/17 0733  GLUCAP 107* 162* 116* 151* 125*   Lipid Profile: No results for input(s): CHOL, HDL, LDLCALC, TRIG, CHOLHDL, LDLDIRECT in the last 72 hours. Thyroid Function Tests: No results for input(s): TSH, T4TOTAL, FREET4, T3FREE, THYROIDAB in the last 72 hours. Anemia Panel: No results for input(s): VITAMINB12, FOLATE, FERRITIN, TIBC, IRON, RETICCTPCT in the last 72 hours. Sepsis Labs: No results for input(s): PROCALCITON, LATICACIDVEN in the last 168 hours.  Recent Results (from the past 240 hour(s))  C difficile quick scan w PCR reflex     Status: Abnormal   Collection Time: 04/21/17  9:00 AM  Result Value Ref Range Status   C Diff antigen POSITIVE (A) NEGATIVE Final   C Diff toxin NEGATIVE NEGATIVE Final   C Diff interpretation Results are indeterminate. See PCR results.  Final  Clostridium Difficile by PCR     Status: None   Collection Time: 04/21/17  9:00 AM  Result Value Ref Range Status   Toxigenic C Difficile by pcr NEGATIVE NEGATIVE Final    Comment: Patient is colonized with non toxigenic  C. difficile. May not need treatment unless significant symptoms are present. Performed at [facility], [place] [address], [place], [place] [address]     Radiology Studies: Dg Chest 2 View  Result Date: 04/20/2017 CLINICAL DATA:  Shortness of breath. EXAM: CHEST  2 VIEW COMPARISON:  04/05/2017 FINDINGS: Low lung volumes with left base collapse/ consolidation and small pleural effusion. Right lung clear. Cardiopericardial silhouette is at upper limits of normal for size. The visualized bony structures of the thorax are intact. IMPRESSION: Posterior left lower lobe collapse/consolidation with small effusion. Electronically Signed   By: Misty Stanley M.D.   On: 04/20/2017 16:28   Scheduled Meds: . atorvastatin  30 mg  Oral q1800  . carvedilol  12.5 mg Oral BID WC  . heparin  5,000 Units Subcutaneous Q8H  . hydrALAZINE  25 mg Oral Q8H  . insulin aspart  0-9 Units Subcutaneous TID WC  . mouth rinse  15 mL Mouth Rinse BID  . mupirocin cream   Topical BID  . PARoxetine  20 mg Oral Daily  . risperiDONE  0.5 mg Oral BID  . rOPINIRole  1 mg Oral QHS  . sodium chloride flush  3 mL Intravenous Q12H   Continuous Infusions: . sodium chloride    . sodium chloride    . sodium chloride    . furosemide Stopped (04/22/17 0813)     LOS: 0 days   Time spent: 22 mins  Irwin Brakeman, MD [facility] Pager [phone]  If 7PM-7AM, please contact night-coverage [email or web address] Password [email or web address] 04/22/2017, 10:14 AM

## 2017-04-22 NOTE — Clinical Social Work Note (Signed)
LCSW spoke with Christen Bame at [facility] admission. She stated that patient had a tentative chair at the [place] clinic (MWF 9:30-12:30). She stated that she needed patient's Hep B panel. LCSW advised that test had been ordered and results would be sent to admissions when results came in.      Meka Lewan, Clydene Pugh, LCSW

## 2017-04-22 NOTE — Progress Notes (Signed)
Right AVF + bruit and thrill.  Cannulation attempt with 17 g needle unsuccessful, clots from arterial site noted, wet stick cannulation also unsuccessful. Patient complaining of numbness in right hand, positive pulse.  Second Hemodialysis RN also attempted  Cannulation with 17 g needle, clots  pulled from access.  Dr Theador Hawthorne notified of above. No additional orders received, will attempt HD again in the morning

## 2017-04-22 NOTE — Clinical Social Work Note (Signed)
LCSW sent Hep B result to [facility] central intake.     Sahas Sluka, Clydene Pugh, LCSW

## 2017-04-22 NOTE — Progress Notes (Addendum)
Martha [name]  MRN: [id number]  DOB/AGE: [birth year] 49 y.o.  Primary Care Physician:Hawkins, Percell Miller, MD  Admit date: 04/20/2017  Chief Complaint:  Chief Complaint  Patient presents with  . Shortness of Breath    S-Pt presented on  04/20/2017 with  Chief Complaint  Patient presents with  . Shortness of Breath  .    Pt does not offer any specific complaints. Pt main concerns were about dialysis schedule.   Meds  . atorvastatin  30 mg Oral q1800  . carvedilol  12.5 mg Oral BID WC  . heparin  5,000 Units Subcutaneous Q8H  . hydrALAZINE  25 mg Oral Q8H  . insulin aspart  0-9 Units Subcutaneous TID WC  . mouth rinse  15 mL Mouth Rinse BID  . PARoxetine  20 mg Oral Daily  . risperiDONE  0.5 mg Oral BID  . rOPINIRole  1 mg Oral QHS  . sodium chloride flush  3 mL Intravenous Q12H          Physical Exam: Vital signs in last 24 hours: Temp:  [98 F (36.7 C)-98.5 F (36.9 C)] 98.4 F (36.9 C) (09/25 2025) Pulse Rate:  [69-87] 87 (09/25 2025) Resp:  [18-20] 18 (09/25 2025) BP: (125-150)/(55-79) 150/64 (09/25 2025) SpO2:  [92 %-100 %] 92 % (09/25 2025) Weight:  [365 lb 10.8 oz (165.9 kg)-365 lb 11.9 oz (165.9 kg)] 365 lb 11.9 oz (165.9 kg) (09/25 1130) Weight change: 11.9 oz (0.337 kg) Last BM Date: 04/21/17  Intake/Output from previous day: 09/25 0701 - 09/26 0700 In: 542 [P.O.:480; IV Piggyback:62] Out: 1650 [Urine:650] Total I/O In: -  Out: 650 [Urine:650]   Physical Exam: General- pt is awake but not oriented. Resp- No acute REsp distress,  Rhonchi+ Decreased on left side CVS- S1S2 regular in rate and rhythm GIT- BS+, soft, NT, ND EXT- 1+ LE Edema, No Cyanosis Access-AVF +  Lab Results: CBC  Recent Labs  04/20/17 1606 04/21/17 0443  WBC 4.3 3.8*  HGB 9.1* 8.7*  HCT 29.8* 29.0*  PLT 141* 138*    BMET  Recent Labs  04/20/17 1606 04/21/17 0443  NA 142 145  K 4.7 4.5  CL 111 114*  CO2 23 22  GLUCOSE 114* 115*  BUN 61* 61*  CREATININE  5.37* 5.49*  CALCIUM 9.0 8.9   Creat trend 2018 4.9=> 5.5( Now ESRD)        4.1--5.2( baseline)       6.7=>4.8=>4.5( april admission)  2017  2.3--4.7 2016 1.9--2.2 2015 1.3--3.4   Lab Results  Component Value Date   PTH 103 (H) 04/10/2017   PTH Comment 04/10/2017   CALCIUM 8.9 04/21/2017   CAION 1.15 06/21/2015   PHOS 6.9 (H) 04/10/2017               Impression: 1)Renal  CKD stage 5=> now ESRD.               CKD since 2015               CKD secondary to  DM                Progression of CKD marked with multiple episodes of AKI                Proteinura present.                        ESRD since 04/21/17  HD initiated on 9.25.18                Hep B and Hep C negative                Will place ppd today   2)HTN  Medication-  On Diuretics- On Alpha and beta Blockers  On Vasodilators-    3)Anemia HGb stable  4)CKD Mineral-Bone Disorder PTH acceptable  Secondary Hyperparathyroidism absent. Phosphorus not at goal.   5)DM PMD following  6)Electrolytes Normokalemic NOrmonatremic   7)Acid base Co2 at goal Acidosis now better  8) Dyspnea  CHF/COPD?Sleep apnea    admitted with fluid overload    Now better.  Plan:  Will continue current care. Will ask for PPD placement   Addendum I was informed by RN about inability to dialyze secondary " pulling clots" Will dialyze in am    Rubye Strohmeyer S 04/22/2017, 5:20 AM

## 2017-04-23 DIAGNOSIS — N185 Chronic kidney disease, stage 5: Secondary | ICD-10-CM

## 2017-04-23 LAB — PHOSPHORUS: PHOSPHORUS: 5.4 mg/dL — AB (ref 2.5–4.6)

## 2017-04-23 LAB — COMPREHENSIVE METABOLIC PANEL
ALBUMIN: 3.1 g/dL — AB (ref 3.5–5.0)
ALK PHOS: 61 U/L (ref 38–126)
ALT: 12 U/L — AB (ref 14–54)
ANION GAP: 9 (ref 5–15)
AST: 13 U/L — ABNORMAL LOW (ref 15–41)
BILIRUBIN TOTAL: 0.5 mg/dL (ref 0.3–1.2)
BUN: 53 mg/dL — ABNORMAL HIGH (ref 6–20)
CALCIUM: 8.9 mg/dL (ref 8.9–10.3)
CO2: 25 mmol/L (ref 22–32)
CREATININE: 5.19 mg/dL — AB (ref 0.44–1.00)
Chloride: 109 mmol/L (ref 101–111)
GFR calc Af Amer: 10 mL/min — ABNORMAL LOW (ref >60)
GFR calc non Af Amer: 9 mL/min — ABNORMAL LOW (ref >60)
GLUCOSE: 111 mg/dL — AB (ref 65–99)
Potassium: 4.3 mmol/L (ref 3.5–5.1)
Sodium: 143 mmol/L (ref 135–145)
TOTAL PROTEIN: 6.4 g/dL — AB (ref 6.5–8.1)

## 2017-04-23 LAB — GLUCOSE, CAPILLARY
GLUCOSE-CAPILLARY: 126 mg/dL — AB (ref 65–99)
Glucose-Capillary: 123 mg/dL — ABNORMAL HIGH (ref 65–99)
Glucose-Capillary: 122 mg/dL — ABNORMAL HIGH (ref 65–99)
Glucose-Capillary: 169 mg/dL — ABNORMAL HIGH (ref 65–99)

## 2017-04-23 LAB — CBC WITH DIFFERENTIAL/PLATELET
BASOS ABS: 0 10*3/uL (ref 0.0–0.1)
BASOS PCT: 1 %
EOS ABS: 0.2 10*3/uL (ref 0.0–0.7)
Eosinophils Relative: 5 %
HEMATOCRIT: 28.1 % — AB (ref 36.0–46.0)
Hemoglobin: 8.1 g/dL — ABNORMAL LOW (ref 12.0–15.0)
Lymphocytes Relative: 41 %
Lymphs Abs: 1.7 10*3/uL (ref 0.7–4.0)
MCH: 25.2 pg — ABNORMAL LOW (ref 26.0–34.0)
MCHC: 28.8 g/dL — AB (ref 30.0–36.0)
MCV: 87.3 fL (ref 78.0–100.0)
MONO ABS: 0.4 10*3/uL (ref 0.1–1.0)
MONOS PCT: 11 %
NEUTROS ABS: 1.7 10*3/uL (ref 1.7–7.7)
Neutrophils Relative %: 42 %
PLATELETS: 157 10*3/uL (ref 150–400)
RBC: 3.22 MIL/uL — ABNORMAL LOW (ref 3.87–5.11)
RDW: 17.3 % — AB (ref 11.5–15.5)
WBC: 4 10*3/uL (ref 4.0–10.5)

## 2017-04-23 MED ORDER — HEPARIN SODIUM (PORCINE) 1000 UNIT/ML IJ SOLN
INTRAMUSCULAR | Status: AC
  Administered 2017-04-23: 1000 [IU] via INTRAVENOUS_CENTRAL
  Filled 2017-04-23: qty 1

## 2017-04-23 MED ORDER — HYDROMORPHONE HCL 1 MG/ML IJ SOLN
0.5000 mg | Freq: Once | INTRAMUSCULAR | Status: AC | PRN
  Administered 2017-04-23: 0.5 mg via INTRAVENOUS
  Filled 2017-04-23: qty 1

## 2017-04-23 NOTE — Progress Notes (Signed)
Subjective: Interval History: has complaints of pain over her arms after being started for dialysis. Her breathing is okay but needed to use oxygen at night. She denies any nausea or vomiting..  Objective: Vital signs in last 24 hours: Temp:  [97.5 F (36.4 C)-99 F (37.2 C)] 99 F (37.2 C) (09/27 0786) Pulse Rate:  [80] 80 (09/27 0614) Resp:  [16-18] 18 (09/27 0614) BP: (132-144)/(65-79) 144/79 (09/27 0614) SpO2:  [93 %-98 %] 98 % (09/27 0614) Weight:  [164.5 kg (362 lb 10.5 oz)] 164.5 kg (362 lb 10.5 oz) (09/27 0614) Weight change: -1.4 kg (-3 lb 1.4 oz)  Intake/Output from previous day: 09/26 0701 - 09/27 0700 In: 488 [P.O.:240; IV Piggyback:248] Out: 1700 [Urine:1700] Intake/Output this shift: No intake/output data recorded.  General appearance: alert, cooperative and no distress Resp: diminished breath sounds bilaterally Cardio: regular rate and rhythm Extremities: edema 1+ edema bilaterally  Lab Results:  Recent Labs  04/22/17 0517 04/23/17 0558  WBC 3.8* 4.0  HGB 8.0* 8.1*  HCT 27.4* 28.1*  PLT 137* 157   BMET:  Recent Labs  04/22/17 0517 04/23/17 0558  NA 142 143  K 4.2 4.3  CL 108 109  CO2 25 25  GLUCOSE 133* 111*  BUN 49* 53*  CREATININE 4.85* 5.19*  CALCIUM 8.7* 8.9   No results for input(s): PTH in the last 72 hours. Iron Studies: No results for input(s): IRON, TIBC, TRANSFERRIN, FERRITIN in the last 72 hours.  Studies/Results: No results found.  I have reviewed the patient's current medications.  Assessment/Plan: Problem #1 difficulty breathing: The combination of COPD/sleep apnea/fluid overload. Presently she is on Lasix and she had 1700 mL of urine output. However unable to dialyze her yesterday. Patient is feeling somewhat better. Problem #2 chronic renal failure: Stage V. She is status post short dialysis on Tuesday. 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 Her potassium is normal and  patient doesn't have any nausea or vomiting. Problem #3 anemia: Her hemoglobin is below our target goal. Problem #4 history of COPD Problem #5 history of obstructive sleep apnea: Patient on CPAP Problem #6 hypertension: Her blood pressure is reasonably controlled Problem #7 history of depression/bipolar disorder Problem #8 Bone and mineral disorder: Her calcium and phosphorus is in range. Plan: 1]We'll try to dialyze patient today 2 If we still continue to have a problem with fistula; possibly will send her for tunneled catheter placement and continues dialysis. 3] we'll continue his diuretics. 4] we'll check her CBC and renal panel in the morning.    LOS: 1 day   Janessa Mickle S 04/23/2017,8:32 AM

## 2017-04-23 NOTE — Progress Notes (Signed)
PROGRESS NOTE    CARLOTTA TELFAIR  WCH:[id number] DOB: 05/05/1966 DOA: 04/20/2017 PCP: Sinda Du, MD    Brief Narrative:  51 year old female with a history of chronic kidney disease stage V, presented to the hospital with worsening shortness of breath and evidence of volume overload. She was admitted to the hospital, started on intravenous diuretics. Nephrology has seen the patient started her on dialysis for volume removal.  Assessment & Plan:   Active Problems:   Hyperlipidemia   Morbid obesity ([place])   Essential hypertension   Sleep apnea   Anemia   Diabetes mellitus, type 2 (HCC)   CKD (chronic kidney disease) stage 5, GFR less than 15 ml/min (HCC)   Bipolar 1 disorder (HCC)   Anasarca associated with disorder of kidney   Renal failure (ARF), acute on chronic ([place])   1. Acute on chronic diastolic congestive heart failure. Precipitated by worsening renal failure. She is currently on intravenous Lasix. She's been started on dialysis. She had improvement after first treatment, unable to have HD yesterday, planning HD today.  She remains volume overloaded but much improved. Continue volume removal. Monitor daily weights. Continue beta blocker 2. Chronic kidney disease stage V. Nephrology following. She is continued on IV Lasix and has been started on dialysis. Will likely need to continue on regularly scheduled dialysis. Social work assisting in setting her up with the dialysis center. Hep B serology OK.  3. Anemia. Suspect this is related to chronic kidney disease. No evidence of bleeding. Continue to monitor.  STABLE.  4. Bipolar 1 disorder. Continue psychotropics.  5. Hypertension. Blood pressure stable. Continue current treatments. 6. Obstructive Sleep apnea. Continue on CPAP 7. Morbid obesity. Counseled on importance of diet and exercise 8. Diarrhea. Patient tested positive for C. difficile antigen, negative for C. difficile toxin, and negative for C. difficile PCR. This does  not indicate an active infection. Continue to treat supportively.  DVT prophylaxis: Heparin Code Status: Full code Family Communication: No family present Disposition Plan: Discharge home once improved  Consultants:   Nephrology  Subjective: Pt didn't have HD yesterday but reports that overall she is improving.  She is noticing improvement after the first HD session a couple of days ago.   Objective: Vitals:   04/22/17 1600 04/22/17 1954 04/22/17 2106 04/23/17 0614  BP: 132/65  134/78 (!) 144/79  Pulse:   80 80  Resp:   16 18  Temp: 98 F (36.7 C)  (!) 97.5 F (36.4 C) 99 F (37.2 C)  TempSrc: Oral  Axillary Oral  SpO2: 95% 96% 93% 98%  Weight:    (!) 164.5 kg (362 lb 10.5 oz)  Height:        Intake/Output Summary (Last 24 hours) at 04/23/17 0857 Last data filed at 04/23/17 0836  Gross per 24 hour  Intake              488 ml  Output             2100 ml  Net            -1612 ml   Filed Weights   04/21/17 0555 04/21/17 1130 04/23/17 0614  Weight: (!) 165.9 kg (365 lb 10.8 oz) (!) 165.9 kg (365 lb 11.9 oz) (!) 164.5 kg (362 lb 10.5 oz)    Examination:  General exam: Appears calm and comfortable NAD Cooperative. Respiratory system:  Respiratory effort normal. Cardiovascular system: S1 & S2 heard, RRR. No JVD, murmurs, rubs, gallops or clicks. 2+ pedal  edema. Gastrointestinal system: Abdomen is nondistended, soft and nontender. No organomegaly or masses felt. Normal bowel sounds heard. Central nervous system: Alert and oriented. No focal neurological deficits. Extremities: 2+ pitting edema BLEs.  Symmetric 5 x 5 power. Skin: No rashes, lesions or ulcers Psychiatry: Judgement and insight appear normal. Mood & affect appropriate.   Data Reviewed: I have personally reviewed following labs and imaging studies  CBC:  Recent Labs Lab 04/20/17 1606 04/21/17 0443 04/22/17 0517 04/23/17 0558  WBC 4.3 3.8* 3.8* 4.0  NEUTROABS 2.5  --   --  1.7  HGB 9.1* 8.7* 8.0*  8.1*  HCT 29.8* 29.0* 27.4* 28.1*  MCV 85.4 85.8 87.3 87.3  PLT 141* 138* 137* 355   Basic Metabolic Panel:  Recent Labs Lab 04/20/17 1606 04/21/17 0443 04/22/17 0517 04/23/17 0558  NA 142 145 142 143  K 4.7 4.5 4.2 4.3  CL 111 114* 108 109  CO2 23 22 25 25   GLUCOSE 114* 115* 133* 111*  BUN 61* 61* 49* 53*  CREATININE 5.37* 5.49* 4.85* 5.19*  CALCIUM 9.0 8.9 8.7* 8.9  PHOS  --   --  5.4* 5.4*   GFR: Estimated Creatinine Clearance: 21.5 mL/min (A) (by C-G formula based on SCr of 5.19 mg/dL (H)). Liver Function Tests:  Recent Labs Lab 04/20/17 1606 04/21/17 0443 04/22/17 0517 04/23/17 0558  AST 11* 11*  --  13*  ALT 14 12*  --  12*  ALKPHOS 64 56  --  61  BILITOT 0.5 0.6  --  0.5  PROT 6.7 6.1*  --  6.4*  ALBUMIN 3.4* 3.1* 2.9* 3.1*   No results for input(s): LIPASE, AMYLASE in the last 168 hours. No results for input(s): AMMONIA in the last 168 hours. Coagulation Profile: No results for input(s): INR, PROTIME in the last 168 hours. Cardiac Enzymes: No results for input(s): CKTOTAL, CKMB, CKMBINDEX, TROPONINI in the last 168 hours. BNP (last 3 results) No results for input(s): PROBNP in the last 8760 hours. HbA1C:  Recent Labs  04/20/17 2112  HGBA1C 5.8*   CBG:  Recent Labs Lab 04/22/17 0733 04/22/17 1114 04/22/17 1644 04/22/17 2110 04/23/17 0753  GLUCAP 125* 150* 116* 136* 123*   Lipid Profile: No results for input(s): CHOL, HDL, LDLCALC, TRIG, CHOLHDL, LDLDIRECT in the last 72 hours. Thyroid Function Tests: No results for input(s): TSH, T4TOTAL, FREET4, T3FREE, THYROIDAB in the last 72 hours. Anemia Panel: No results for input(s): VITAMINB12, FOLATE, FERRITIN, TIBC, IRON, RETICCTPCT in the last 72 hours. Sepsis Labs: No results for input(s): PROCALCITON, LATICACIDVEN in the last 168 hours.  Recent Results (from the past 240 hour(s))  C difficile quick scan w PCR reflex     Status: Abnormal   Collection Time: 04/21/17  9:00 AM  Result Value  Ref Range Status   C Diff antigen POSITIVE (A) NEGATIVE Final   C Diff toxin NEGATIVE NEGATIVE Final   C Diff interpretation Results are indeterminate. See PCR results.  Final  Clostridium Difficile by PCR     Status: None   Collection Time: 04/21/17  9:00 AM  Result Value Ref Range Status   Toxigenic C Difficile by pcr NEGATIVE NEGATIVE Final    Comment: Patient is colonized with non toxigenic C. difficile. May not need treatment unless significant symptoms are present. Performed at [facility], [place] [address] [place], [place] [address]     Radiology Studies: No results found. Scheduled Meds: . atorvastatin  30 mg Oral q1800  . carvedilol  12.5 mg Oral BID WC  . heparin  5,000 Units Subcutaneous Q8H  . hydrALAZINE  25 mg Oral Q8H  . insulin aspart  0-9 Units Subcutaneous TID WC  . mouth rinse  15 mL Mouth Rinse BID  . mupirocin cream   Topical BID  . PARoxetine  20 mg Oral Daily  . risperiDONE  0.5 mg Oral BID  . rOPINIRole  1 mg Oral QHS  . sodium chloride flush  3 mL Intravenous Q12H  . tuberculin  5 Units Intradermal Once   Continuous Infusions: . sodium chloride    . sodium chloride    . sodium chloride    . sodium chloride    . sodium chloride    . furosemide Stopped (04/23/17 0736)     LOS: 1 day   Time spent: 21 mins  Irwin Brakeman, MD [facility] Pager [phone]  If 7PM-7AM, please contact night-coverage [email or web address] Password [facility] 04/23/2017, 8:57 AM

## 2017-04-24 LAB — RENAL FUNCTION PANEL
ANION GAP: 9 (ref 5–15)
Albumin: 3.2 g/dL — ABNORMAL LOW (ref 3.5–5.0)
BUN: 40 mg/dL — ABNORMAL HIGH (ref 6–20)
CHLORIDE: 105 mmol/L (ref 101–111)
CO2: 28 mmol/L (ref 22–32)
Calcium: 9 mg/dL (ref 8.9–10.3)
Creatinine, Ser: 4.33 mg/dL — ABNORMAL HIGH (ref 0.44–1.00)
GFR calc non Af Amer: 11 mL/min — ABNORMAL LOW (ref >60)
GFR, EST AFRICAN AMERICAN: 13 mL/min — AB (ref >60)
Glucose, Bld: 114 mg/dL — ABNORMAL HIGH (ref 65–99)
Phosphorus: 4.4 mg/dL (ref 2.5–4.6)
Potassium: 4 mmol/L (ref 3.5–5.1)
Sodium: 142 mmol/L (ref 135–145)

## 2017-04-24 LAB — GLUCOSE, CAPILLARY
GLUCOSE-CAPILLARY: 124 mg/dL — AB (ref 65–99)
Glucose-Capillary: 105 mg/dL — ABNORMAL HIGH (ref 65–99)
Glucose-Capillary: 128 mg/dL — ABNORMAL HIGH (ref 65–99)
Glucose-Capillary: 122 mg/dL — ABNORMAL HIGH (ref 65–99)

## 2017-04-24 LAB — CBC
HEMATOCRIT: 28.3 % — AB (ref 36.0–46.0)
HEMOGLOBIN: 8.3 g/dL — AB (ref 12.0–15.0)
MCH: 24.9 pg — AB (ref 26.0–34.0)
MCHC: 29.3 g/dL — ABNORMAL LOW (ref 30.0–36.0)
MCV: 85 fL (ref 78.0–100.0)
Platelets: 162 10*3/uL (ref 150–400)
RBC: 3.33 MIL/uL — AB (ref 3.87–5.11)
RDW: 17.1 % — ABNORMAL HIGH (ref 11.5–15.5)
WBC: 4 10*3/uL (ref 4.0–10.5)

## 2017-04-24 MED ORDER — FUROSEMIDE 40 MG PO TABS
120.0000 mg | ORAL_TABLET | Freq: Every day | ORAL | Status: DC
  Administered 2017-04-24 – 2017-04-25 (×2): 120 mg via ORAL
  Filled 2017-04-24 (×2): qty 1

## 2017-04-24 MED ORDER — FUROSEMIDE 10 MG/ML PO SOLN: 120.0000 mg | Freq: Every day | ORAL | Status: DC

## 2017-04-24 NOTE — Progress Notes (Signed)
Subjective: Interval History : patient is feeling much better. She has still some pain of her hand. She denies any difficulty breathing.   Objective: Vital signs in last 24 hours: Temp:  [98 F (36.7 C)-99 F (37.2 C)] 98.6 F (37 C) (09/28 0653) Pulse Rate:  [71-85] 85 (09/28 0653) Resp:  [20] 20 (09/28 0653) BP: (111-146)/(54-84) 112/54 (09/28 0653) SpO2:  [92 %-94 %] 92 % (09/28 0653) Weight:  [164.5 kg (362 lb 10.5 oz)] 164.5 kg (362 lb 10.5 oz) (09/27 1300) Weight change: 0 kg (0 lb)  Intake/Output from previous day: 09/27 0701 - 09/28 0700 In: 240 [P.O.:240] Out: 3000 [Urine:1000] Intake/Output this shift: No intake/output data recorded.  General appearance: alert, cooperative and no distress Resp: diminished breath sounds bilaterally Cardio: regular rate and rhythm Extremities: edema 1+ edema bilaterally  Lab Results:  Recent Labs  04/23/17 0558 04/24/17 0552  WBC 4.0 4.0  HGB 8.1* 8.3*  HCT 28.1* 28.3*  PLT 157 162   BMET:   Recent Labs  04/23/17 0558 04/24/17 0552  NA 143 142  K 4.3 4.0  CL 109 105  CO2 25 28  GLUCOSE 111* 114*  BUN 53* 40*  CREATININE 5.19* 4.33*  CALCIUM 8.9 9.0   No results for input(s): PTH in the last 72 hours. Iron Studies: No results for input(s): IRON, TIBC, TRANSFERRIN, FERRITIN in the last 72 hours.  Studies/Results: No results found.  I have reviewed the patient's current medications.  Assessment/Plan: Problem #1 difficulty breathing: The combination of COPD/sleep apnea/fluid overload. Patient is nonoliguric and she had about 1 L of urine output. And also were able to remove about 2 L was dialysis. Hence she has lost about 3 L. Patient however was significant anasarca. Her breathing is much better.. Problem #2 chronic renal failure: endstage renal disease. She is status post hemodialysis yesterday. Potassium is normal and she is asymptomatic.  Problem #3 anemia: Her hemoglobin is below our target goal. her  hemoglobin is stable.  Problem #4 history of COPD Problem #5 history of obstructive sleep apnea: Patient on CPAP Problem #6 hypertension: Her blood pressure is reasonably controlled Problem #7 history of depression/bipolar disorder Problem #8 Bone and mineral disorder: Her calcium and phosphorus is in range. Plan: 1]We'll try to dialyze patient for 4-1/2 hours tomorrow 2] we'll remove about 66/2 l if systolic blood pressure remains above 90. 3] we'll DC IV Lasix and will start her on by mouth Lasix 120 mg once a day. 4] we'll check her CBC and renal panel in the morning.  5] patient could be discharged once her outpatient dialysis placement is arranged.    LOS: 2 days   Coty Student S 04/24/2017,9:13 AM

## 2017-04-24 NOTE — Progress Notes (Signed)
CSW called [facility]. Patient is able to start there this Monday for 11:30am chair time. She needs to be there at 10:30am to do paperwork.   Percell Locus Orlan Aversa LCSWA [phone]

## 2017-04-24 NOTE — Progress Notes (Signed)
Pt educated on how to keep AV Fistula access clean, how to feel for thrill of AVF on daily basis, what to do if thrill is not felt, fluid restriction, diet and access site care. Pt was educated on being a high fall risk and to call for help before she transfers from bed to chair. Bed/Chair alarm activated, and fall risk braclet on.

## 2017-04-24 NOTE — Progress Notes (Signed)
Pt stated she has a new onset of headache and light headedness. Vitals checked, WNL. Blood sugar was WNL. Pain pill and BP pill given approx.  30- 45 mins ago. MD made aware. Will continue to monitor.

## 2017-04-24 NOTE — Progress Notes (Signed)
PROGRESS NOTE   Martha [name]  HQI:[id number] DOB: [birth year] DOA: 04/20/2017 PCP: Sinda Du, MD   Brief Narrative:  51 year old female with a history of chronic kidney disease stage V, presented to the hospital with worsening shortness of breath and evidence of volume overload. She was admitted to the hospital, started on intravenous diuretics. Nephrology has seen the patient started her on dialysis for volume removal.  Assessment & Plan:   Active Problems:   Hyperlipidemia   Morbid obesity ([place])   Essential hypertension   Sleep apnea   Anemia   Diabetes mellitus, type 2 (HCC)   CKD (chronic kidney disease) stage 5, GFR less than 15 ml/min (HCC)   Bipolar 1 disorder (HCC)   Anasarca associated with disorder of kidney   Renal failure (ARF), acute on chronic ([place])   1. Acute on chronic diastolic congestive heart failure. Precipitated by worsening renal failure. She is currently on intravenous Lasix. She's been started on dialysis. She had improvement after first treatment, Had HD yesterday, planning HD tomorrow.  She remains volume overloaded but much improved. Continue volume removal. Monitor daily weights. Continue beta blocker 2. Chronic kidney disease stage V. Nephrology following.  Will likely need to continue on regularly scheduled dialysis.  Nephrology arranging her dialysis schedule outpatient.  Social work assisting in setting her up with the dialysis center. Hep B serology OK.  3. Anemia. Suspect this is related to chronic kidney disease. No evidence of bleeding. Continue to monitor.  STABLE.  4. Bipolar 1 disorder. Continue psychotropics.  5. Hypertension. Blood pressure stable. Continue current treatments. 6. Obstructive Sleep apnea. Continue on CPAP 7. Morbid obesity. Counseled on importance of diet and exercise 8. Diarrhea. Patient tested positive for C. difficile antigen, negative for C. difficile toxin, and negative for C. difficile PCR. This does not indicate an  active infection. Continue to treat supportively.  DVT prophylaxis: Heparin Code Status: Full code Family Communication: daughter present Disposition Plan: Possible DC home tomorrow.  Consultants:   Nephrology  Subjective: Pt says she is feeling better, had HD yesterday and it is planned for tomorrow.    Objective: Vitals:   04/23/17 1625 04/23/17 2134 04/23/17 2300 04/24/17 0653  BP: (!) 146/84 (!) 142/68  (!) 112/54  Pulse: 85 81  85  Resp: 20 20  20   Temp: 98 F (36.7 C) 98.5 F (36.9 C)  98.6 F (37 C)  TempSrc: Oral Oral  Oral  SpO2:  93% 94% 92%  Weight:      Height:        Intake/Output Summary (Last 24 hours) at 04/24/17 1033 Last data filed at 04/24/17 0654  Gross per 24 hour  Intake              240 ml  Output             2600 ml  Net            -2360 ml   Filed Weights   04/21/17 1130 04/23/17 0614 04/23/17 1300  Weight: (!) 165.9 kg (365 lb 11.9 oz) (!) 164.5 kg (362 lb 10.5 oz) (!) 164.5 kg (362 lb 10.5 oz)    Examination:  General exam: Appears calm and comfortable NAD Cooperative. Respiratory system:  Respiratory effort normal. Cardiovascular system: S1 & S2 heard, RRR. No JVD, murmurs, rubs, gallops or clicks. 2+ pedal edema. Gastrointestinal system: Abdomen is nondistended, soft and nontender. No organomegaly or masses felt. Normal bowel sounds heard. Central nervous system: Alert and oriented.  No focal neurological deficits. Extremities: 2+ pitting edema BLEs.  Symmetric 5 x 5 power. Skin: No rashes, lesions or ulcers Psychiatry: Judgement and insight appear normal. Mood & affect appropriate.   Data Reviewed: I have personally reviewed following labs and imaging studies  CBC:  Recent Labs Lab 04/20/17 1606 04/21/17 0443 04/22/17 0517 04/23/17 0558 04/24/17 0552  WBC 4.3 3.8* 3.8* 4.0 4.0  NEUTROABS 2.5  --   --  1.7  --   HGB 9.1* 8.7* 8.0* 8.1* 8.3*  HCT 29.8* 29.0* 27.4* 28.1* 28.3*  MCV 85.4 85.8 87.3 87.3 85.0  PLT 141* 138*  137* 157 812   Basic Metabolic Panel:  Recent Labs Lab 04/20/17 1606 04/21/17 0443 04/22/17 0517 04/23/17 0558 04/24/17 0552  NA 142 145 142 143 142  K 4.7 4.5 4.2 4.3 4.0  CL 111 114* 108 109 105  CO2 23 22 25 25 28   GLUCOSE 114* 115* 133* 111* 114*  BUN 61* 61* 49* 53* 40*  CREATININE 5.37* 5.49* 4.85* 5.19* 4.33*  CALCIUM 9.0 8.9 8.7* 8.9 9.0  PHOS  --   --  5.4* 5.4* 4.4   GFR: Estimated Creatinine Clearance: 25.8 mL/min (A) (by C-G formula based on SCr of 4.33 mg/dL (H)). Liver Function Tests:  Recent Labs Lab 04/20/17 1606 04/21/17 0443 04/22/17 0517 04/23/17 0558 04/24/17 0552  AST 11* 11*  --  13*  --   ALT 14 12*  --  12*  --   ALKPHOS 64 56  --  61  --   BILITOT 0.5 0.6  --  0.5  --   PROT 6.7 6.1*  --  6.4*  --   ALBUMIN 3.4* 3.1* 2.9* 3.1* 3.2*   No results for input(s): LIPASE, AMYLASE in the last 168 hours. No results for input(s): AMMONIA in the last 168 hours. Coagulation Profile: No results for input(s): INR, PROTIME in the last 168 hours. Cardiac Enzymes: No results for input(s): CKTOTAL, CKMB, CKMBINDEX, TROPONINI in the last 168 hours. BNP (last 3 results) No results for input(s): PROBNP in the last 8760 hours. HbA1C: No results for input(s): HGBA1C in the last 72 hours. CBG:  Recent Labs Lab 04/23/17 0753 04/23/17 1154 04/23/17 1706 04/23/17 2113 04/24/17 0747  GLUCAP 123* 126* 122* 169* 105*   Lipid Profile: No results for input(s): CHOL, HDL, LDLCALC, TRIG, CHOLHDL, LDLDIRECT in the last 72 hours. Thyroid Function Tests: No results for input(s): TSH, T4TOTAL, FREET4, T3FREE, THYROIDAB in the last 72 hours. Anemia Panel: No results for input(s): VITAMINB12, FOLATE, FERRITIN, TIBC, IRON, RETICCTPCT in the last 72 hours. Sepsis Labs: No results for input(s): PROCALCITON, LATICACIDVEN in the last 168 hours.  Recent Results (from the past 240 hour(s))  C difficile quick scan w PCR reflex     Status: Abnormal   Collection Time:  04/21/17  9:00 AM  Result Value Ref Range Status   C Diff antigen POSITIVE (A) NEGATIVE Final   C Diff toxin NEGATIVE NEGATIVE Final   C Diff interpretation Results are indeterminate. See PCR results.  Final  Clostridium Difficile by PCR     Status: None   Collection Time: 04/21/17  9:00 AM  Result Value Ref Range Status   Toxigenic C Difficile by pcr NEGATIVE NEGATIVE Final    Comment: Patient is colonized with non toxigenic C. difficile. May not need treatment unless significant symptoms are present. Performed at [facility], [place] [address] [place], [place] [address]     Radiology Studies: No  results found. Scheduled Meds: . atorvastatin  30 mg Oral q1800  . carvedilol  12.5 mg Oral BID WC  . furosemide  120 mg Oral Daily  . heparin  5,000 Units Subcutaneous Q8H  . hydrALAZINE  25 mg Oral Q8H  . insulin aspart  0-9 Units Subcutaneous TID WC  . mouth rinse  15 mL Mouth Rinse BID  . mupirocin cream   Topical BID  . PARoxetine  20 mg Oral Daily  . risperiDONE  0.5 mg Oral BID  . rOPINIRole  1 mg Oral QHS  . sodium chloride flush  3 mL Intravenous Q12H  . tuberculin  5 Units Intradermal Once   Continuous Infusions: . sodium chloride    . sodium chloride    . sodium chloride    . sodium chloride    . sodium chloride       LOS: 2 days   Time spent: 19 mins  Irwin Brakeman, MD [facility] Pager [phone]  If 7PM-7AM, please contact night-coverage [email or web address] Password [facility] 04/24/2017, 10:33 AM

## 2017-04-25 LAB — CBC
HCT: 28 % — ABNORMAL LOW (ref 36.0–46.0)
HEMOGLOBIN: 8.3 g/dL — AB (ref 12.0–15.0)
MCH: 25.3 pg — ABNORMAL LOW (ref 26.0–34.0)
MCHC: 29.6 g/dL — ABNORMAL LOW (ref 30.0–36.0)
MCV: 85.4 fL (ref 78.0–100.0)
PLATELETS: 159 10*3/uL (ref 150–400)
RBC: 3.28 MIL/uL — ABNORMAL LOW (ref 3.87–5.11)
RDW: 17.2 % — ABNORMAL HIGH (ref 11.5–15.5)
WBC: 4 10*3/uL (ref 4.0–10.5)

## 2017-04-25 LAB — RENAL FUNCTION PANEL
ANION GAP: 12 (ref 5–15)
Albumin: 3.3 g/dL — ABNORMAL LOW (ref 3.5–5.0)
BUN: 44 mg/dL — ABNORMAL HIGH (ref 6–20)
CO2: 27 mmol/L (ref 22–32)
Calcium: 9.4 mg/dL (ref 8.9–10.3)
Chloride: 105 mmol/L (ref 101–111)
Creatinine, Ser: 5.04 mg/dL — ABNORMAL HIGH (ref 0.44–1.00)
GFR calc Af Amer: 11 mL/min — ABNORMAL LOW (ref >60)
GFR calc non Af Amer: 9 mL/min — ABNORMAL LOW (ref >60)
Glucose, Bld: 126 mg/dL — ABNORMAL HIGH (ref 65–99)
POTASSIUM: 4 mmol/L (ref 3.5–5.1)
Phosphorus: 5.1 mg/dL — ABNORMAL HIGH (ref 2.5–4.6)
Sodium: 144 mmol/L (ref 135–145)

## 2017-04-25 LAB — GLUCOSE, CAPILLARY
GLUCOSE-CAPILLARY: 112 mg/dL — AB (ref 65–99)
GLUCOSE-CAPILLARY: 126 mg/dL — AB (ref 65–99)

## 2017-04-25 MED ORDER — HYDROMORPHONE HCL 1 MG/ML IJ SOLN
0.5000 mg | Freq: Once | INTRAMUSCULAR | Status: AC | PRN
  Administered 2017-04-25: 0.5 mg via INTRAVENOUS
  Filled 2017-04-25: qty 1

## 2017-04-25 MED ORDER — LIDOCAINE-PRILOCAINE 2.5-2.5 % EX CREA: 1.0000 "application " | TOPICAL_CREAM | CUTANEOUS | 0 refills | Status: DC | PRN

## 2017-04-25 MED ORDER — FUROSEMIDE 40 MG PO TABS: 120.0000 mg | ORAL_TABLET | Freq: Every day | ORAL | 0 refills | Status: DC

## 2017-04-25 NOTE — Discharge Instructions (Signed)
Follow with Primary MD  Hawkins, Edward, MD  and other consultant's as instructed your Hospitalist MD ° °Please get a complete blood count and chemistry panel checked by your Primary MD at your next visit, and again as instructed by your Primary MD. ° °Get Medicines reviewed and adjusted: °Please take all your medications with you for your next visit with your Primary MD ° °Laboratory/radiological data: °Please request your Primary MD to go over all hospital tests and procedure/radiological results at the follow up, please ask your Primary MD to get all Hospital records sent to his/her office. ° °In some cases, they will be blood work, cultures and biopsy results pending at the time of your discharge. Please request that your primary care M.D. follows up on these results. ° °Also Note the following: °If you experience worsening of your admission symptoms, develop shortness of breath, life threatening emergency, suicidal or homicidal thoughts you must seek medical attention immediately by calling 911 or calling your MD immediately  if symptoms less severe. ° °You must read complete instructions/literature along with all the possible adverse reactions/side effects for all the Medicines you take and that have been prescribed to you. Take any new Medicines after you have completely understood and accpet all the possible adverse reactions/side effects.  ° °Do not drive when taking Pain medications or sleeping medications (Benzodaizepines) ° °Do not take more than prescribed Pain, Sleep and Anxiety Medications. It is not advisable to combine anxiety,sleep and pain medications without talking with your primary care practitioner ° °Special Instructions: If you have smoked or chewed Tobacco  in the last 2 yrs please stop smoking, stop any regular Alcohol  and or any Recreational drug use. ° °Wear Seat belts while driving. ° °Please note: °You were cared for by a hospitalist during your hospital stay. Once you are discharged,  your primary care physician will handle any further medical issues. Please note that NO REFILLS for any discharge medications will be authorized once you are discharged, as it is imperative that you return to your primary care physician (or establish a relationship with a primary care physician if you do not have one) for your post hospital discharge needs so that they can reassess your need for medications and monitor your lab values. ° ° ° ° °

## 2017-04-25 NOTE — Progress Notes (Signed)
Subjective: Interval History : patient continued to feel better as far as her breathing is concerned. Presently she denies any orthopnea. Her appetite is good.  Objective: Vital signs in last 24 hours: Temp:  [98 F (36.7 C)-98.3 F (36.8 C)] 98.3 F (36.8 C) (09/29 0545) Pulse Rate:  [71-81] 81 (09/29 0545) Resp:  [16-20] 17 (09/29 0545) BP: (132-149)/(65-82) 132/66 (09/29 0545) SpO2:  [95 %-97 %] 95 % (09/29 0545) Weight:  [158.5 kg (349 lb 6.9 oz)] 158.5 kg (349 lb 6.9 oz) (09/29 0545) Weight change: -6 kg (-13 lb 3.6 oz)  Intake/Output from previous day: 09/28 0701 - 09/29 0700 In: 240 [P.O.:240] Out: 3 [Urine:2; Stool:1] Intake/Output this shift: No intake/output data recorded.  General appearance: alert, cooperative and no distress Resp: diminished breath sounds bilaterally Cardio: regular rate and rhythm Extremities: edema 1+ edema bilaterally  Lab Results:  Recent Labs  04/24/17 0552 04/25/17 0630  WBC 4.0 4.0  HGB 8.3* 8.3*  HCT 28.3* 28.0*  PLT 162 159   BMET:   Recent Labs  04/24/17 0552 04/25/17 0630  NA 142 144  K 4.0 4.0  CL 105 105  CO2 28 27  GLUCOSE 114* 126*  BUN 40* 44*  CREATININE 4.33* 5.04*  CALCIUM 9.0 9.4   No results for input(s): PTH in the last 72 hours. Iron Studies: No results for input(s): IRON, TIBC, TRANSFERRIN, FERRITIN in the last 72 hours.  Studies/Results: No results found.  I have reviewed the patient's current medications.  Assessment/Plan: Problem #1 difficulty breathing: The combination of COPD/sleep apnea/fluid overload. Patient Remains nonoliguric. Overall she is feeling much better. Problem #2 chronic renal failure: endstage renal disease. Patient denies any nausea or vomiting. Problem #3 anemia: Her hemoglobin is below our target goal,but stable. She is on Epogen during dialysis. Problem #4 history of COPD Problem #5 history of obstructive sleep apnea: Patient on CPAP Problem #6 hypertension: Her blood  pressure is reasonably controlled Problem #7 history of depression/bipolar disorder Problem #8 Bone and mineral disorder: Her calcium and phosphorus is in range. Plan: 1]We'll try to dialyze patient today for 4-1/2 hours  2] we'll remove about 64/3 l if systolic blood pressure remains above 90. 3] we'll follow her as an outpatient. Her next dialysis would be on Monday.   LOS: 3 days   Antionetta Ator S 04/25/2017,8:35 AM

## 2017-04-25 NOTE — Discharge Summary (Signed)
Physician Discharge Summary  Martha [name] MWN:[id number] DOB: Jan 07, 1966 DOA: 04/20/2017  PCP: Sinda Du, MD Nephrologist: Lowanda Foster  Admit date: 04/20/2017 Discharge date: 04/25/2017  Admitted From: Home  Disposition: Home   Recommendations for Outpatient Follow-up:  1. Follow up with PCP in 1 weeks 2. Go to hemodialysis on Monday 10/1 at 10:30 am  3. Please obtain BMP/CBC in one week  Discharge Condition: STABLE  CODE STATUS: FULL    Brief Hospitalization Summary: Please see all hospital notes, images, labs for full details of the hospitalization.   HPI:    Martha [name]  is a 51 y.o. female, With history of CK D stage V, hyperlipidemia, hypertension, diabetes mellitus, sleep apnea, morbid opacity, COPD who came to hospital with worsening shortness of breath and fluid overload or posterolateral weeks. Patient was recently discharged from the hospital on 04/10/2017 on high-dose diuretics. Patient has AV fistula in right arm with  plan for initiation of hemodialysis next month. She denies chest pain, complains of dyspnea on exertion. She is making urine at home. Denies dysuria. Patient complains of diarrhea 2-3 bowel movements every day, she was recently discharged on antibiotics for UTI.  In the ED patient was found to be in acute pulmonary edema, and given 1 dose of IV Lasix 120 mg. She denies fever Denies dysuria No history of stroke or seizures Patient has gained 100 pounds over past 2 months  Brief Narrative:  51 year old female with a history of chronic kidney disease stage V, presented to the hospital with worsening shortness of breath and evidence of volume overload. She was admitted to the hospital, started on intravenous diuretics. Nephrology has seen the patient started her on dialysis for volume removal.  1. Acute on chronic diastolic congestive heart failure. Precipitated by worsening renal failure.  She's been started on hemodialysis. She had improvement after  first treatments in the hospital. She has been assigned to [facility] to start in 2 days on Mon 10/1 at 11:30am.  2. Chronic kidney disease stage V. Nephrology following - Dr. Lowanda Foster will follow outpatient.  MWF hemodialysis arranged.  Nephrology arranged her dialysis schedule outpatient. Hep B serology OK.  3. Anemia. Suspect this is related to chronic kidney disease. No evidence of bleeding. Continue to monitor.  STABLE.  4. Bipolar 1 disorder. Continue psychotropics.  5. Hypertension. Blood pressure stable. Continue current treatments. 6. Obstructive Sleep apnea. Continue on CPAP 7. Morbid obesity. Counseled on importance of diet and exercise 8. Diarrhea. Patient tested positive for C. difficile antigen, negative for C. difficile toxin, and negative for C. difficile PCR. This does not indicate an active infection. Continue to treat supportively.  DVT prophylaxis: Heparin Code Status: Full code Family Communication: daughter present Disposition Plan: Home   Consultants:   Nephrology  Discharge Diagnoses:  Active Problems:   Hyperlipidemia   Morbid obesity ([place])   Essential hypertension   Sleep apnea   Anemia   Diabetes mellitus, type 2 (HCC)   CKD (chronic kidney disease) stage 5, GFR less than 15 ml/min (HCC)   Bipolar 1 disorder (HCC)   Anasarca associated with disorder of kidney   Renal failure (ARF), acute on chronic [facility])  Discharge Instructions: Discharge Instructions    Call MD for:  difficulty breathing, headache or visual disturbances    Complete by:  As directed    Call MD for:  extreme fatigue    Complete by:  As directed    Call MD for:  persistant dizziness or light-headedness  Complete by:  As directed    Call MD for:  persistant nausea and vomiting    Complete by:  As directed    Call MD for:  severe uncontrolled pain    Complete by:  As directed    Increase activity slowly    Complete by:  As directed      Allergies as of 04/25/2017      Reactions    Codeine Nausea And Vomiting   Tape    Plastic Tape      Medication List    STOP taking these medications   lisinopril-hydrochlorothiazide 20-12.5 MG tablet Commonly known as:  PRINZIDE,ZESTORETIC   torsemide 20 MG tablet Commonly known as:  DEMADEX     TAKE these medications   albuterol 108 (90 Base) MCG/ACT inhaler Commonly known as:  PROVENTIL HFA;VENTOLIN HFA Inhale 2 puffs into the lungs every 4 (four) hours as needed for wheezing or shortness of breath.   ALPRAZolam 1 MG tablet Commonly known as:  XANAX Take 1 mg by mouth 4 (four) times daily as needed for anxiety (nerves).   atorvastatin 10 MG tablet Commonly known as:  LIPITOR Take 10 mg by mouth daily. Take along with 20 mg tablet to equal 30 mg daily.   atorvastatin 20 MG tablet Commonly known as:  LIPITOR Take 20 mg by mouth daily. With Atorvastatin 10 mg to equal 30 mg total daily   carvedilol 12.5 MG tablet Commonly known as:  COREG Take 1 tablet (12.5 mg total) by mouth 2 (two) times daily with a meal.   colchicine 0.6 MG tablet Take 1 tablet (0.6 mg total) by mouth every other day.   fluticasone 50 MCG/ACT nasal spray Commonly known as:  FLONASE Place 2 sprays into both nostrils at bedtime.   furosemide 40 MG tablet Commonly known as:  LASIX Take 3 tablets (120 mg total) by mouth daily.   hydrALAZINE 25 MG tablet Commonly known as:  APRESOLINE Take 1 tablet (25 mg total) by mouth every 8 (eight) hours.   HYDROcodone-acetaminophen 10-325 MG tablet Commonly known as:  NORCO Take 1 tablet by mouth every 6 (six) hours as needed for moderate pain.   loperamide 2 MG capsule Commonly known as:  IMODIUM Take 1 capsule (2 mg total) by mouth 4 (four) times daily as needed for diarrhea or loose stools.   PARoxetine 20 MG tablet Commonly known as:  PAXIL Take 1 daily. This is to prevent panic attacks What changed:  how much to take  how to take this  when to take this  additional  instructions   risperiDONE 0.5 MG tablet Commonly known as:  RISPERDAL Take 1 tablet (0.5 mg total) by mouth 2 (two) times daily.   rOPINIRole 1 MG tablet Commonly known as:  REQUIP Take 1 mg by mouth at bedtime.            Discharge Care Instructions        Start     Ordered   04/25/17 0000  furosemide (LASIX) 40 MG tablet  Daily     04/25/17 0940   04/25/17 0000  Increase activity slowly     04/25/17 0940   04/25/17 0000  Call MD for:  persistant nausea and vomiting     04/25/17 0940   04/25/17 0000  Call MD for:  severe uncontrolled pain     04/25/17 0940   04/25/17 0000  Call MD for:  difficulty breathing, headache or visual disturbances  04/25/17 0940   04/25/17 0000  Call MD for:  persistant dizziness or light-headedness     04/25/17 0940   04/25/17 0000  Call MD for:  extreme fatigue     04/25/17 0940     Follow-up Information    Sinda Du, MD. Schedule an appointment as soon as possible for a visit in 1 week(s).   Specialty:  Pulmonary Disease Contact information: [place] [address] [phone]        Fran Lowes, MD. Go on 04/27/2017.   Specialty:  Nephrology Contact information: [name] [place] [address] [phone]          Allergies  Allergen Reactions  . Codeine Nausea And Vomiting  . Tape     Plastic Tape   Current Discharge Medication List    START taking these medications   Details  furosemide (LASIX) 40 MG tablet Take 3 tablets (120 mg total) by mouth daily. Qty: 90 tablet, Refills: 0      CONTINUE these medications which have NOT CHANGED   Details  albuterol (PROVENTIL HFA;VENTOLIN HFA) 108 (90 Base) MCG/ACT inhaler Inhale 2 puffs into the lungs every 4 (four) hours as needed for wheezing or shortness of breath. Qty: 1 Inhaler, Refills: 0    ALPRAZolam (XANAX) 1 MG tablet Take 1 mg by mouth 4 (four) times daily as needed for anxiety (nerves).    !!  atorvastatin (LIPITOR) 10 MG tablet Take 10 mg by mouth daily. Take along with 20 mg tablet to equal 30 mg daily.    !! atorvastatin (LIPITOR) 20 MG tablet Take 20 mg by mouth daily. With Atorvastatin 10 mg to equal 30 mg total daily    carvedilol (COREG) 12.5 MG tablet Take 1 tablet (12.5 mg total) by mouth 2 (two) times daily with a meal. Qty: 60 tablet, Refills: 0    colchicine 0.6 MG tablet Take 1 tablet (0.6 mg total) by mouth every other day. Qty: 16 tablet, Refills: 12    fluticasone (FLONASE) 50 MCG/ACT nasal spray Place 2 sprays into both nostrils at bedtime.    hydrALAZINE (APRESOLINE) 25 MG tablet Take 1 tablet (25 mg total) by mouth every 8 (eight) hours. Qty: 90 tablet, Refills: 12    HYDROcodone-acetaminophen (NORCO) 10-325 MG tablet Take 1 tablet by mouth every 6 (six) hours as needed for moderate pain. Qty: 6 tablet, Refills: 0    loperamide (IMODIUM) 2 MG capsule Take 1 capsule (2 mg total) by mouth 4 (four) times daily as needed for diarrhea or loose stools. Qty: 12 capsule, Refills: 0    PARoxetine (PAXIL) 20 MG tablet Take 1 daily. This is to prevent panic attacks Qty: 30 tablet, Refills: 12    risperiDONE (RISPERDAL) 0.5 MG tablet Take 1 tablet (0.5 mg total) by mouth 2 (two) times daily.    rOPINIRole (REQUIP) 1 MG tablet Take 1 mg by mouth at bedtime.      !! - Potential duplicate medications found. Please discuss with provider.    STOP taking these medications     lisinopril-hydrochlorothiazide (PRINZIDE,ZESTORETIC) 20-12.5 MG tablet      torsemide (DEMADEX) 20 MG tablet         Procedures/Studies: Dg Chest 2 View  Result Date: 04/20/2017 CLINICAL DATA:  Shortness of breath. EXAM: CHEST  2 VIEW COMPARISON:  04/05/2017 FINDINGS: Low lung volumes with left base collapse/ consolidation and small pleural effusion. Right lung clear. Cardiopericardial silhouette is at upper limits of normal for  size. The visualized bony structures of the thorax are intact.  IMPRESSION: Posterior left lower lobe collapse/consolidation with small effusion. Electronically Signed   By: Misty Stanley M.D.   On: 04/20/2017 16:28   Dg Chest Port 1 View  Result Date: 04/05/2017 CLINICAL DATA:  Patient with shortness of breath, worsening. Nonproductive cough for 1 week. EXAM: PORTABLE CHEST 1 VIEW COMPARISON:  Nonproductive FINDINGS: Monitoring leads overlie the patient. Enlarged cardiac and mediastinal contours. Pulmonary vascular redistribution and mild interstitial pulmonary opacities bilaterally. Possible small bilateral pleural effusions. IMPRESSION: Enlarged cardiac contours. Bilateral perihilar interstitial pulmonary opacities suggestive of mild interstitial edema. Possible small bilateral pleural effusions. Electronically Signed   By: Lovey Newcomer M.D.   On: 04/05/2017 19:01      Subjective: Pt feeling much better, discharging home after HD today.    Discharge Exam: Vitals:   04/24/17 2044 04/25/17 0545  BP:  132/66  Pulse: 71 81  Resp: 16 17  Temp:  98.3 F (36.8 C)  SpO2: 95% 95%   Vitals:   04/24/17 1942 04/24/17 2042 04/24/17 2044 04/25/17 0545  BP: 133/65   132/66  Pulse: 75  71 81  Resp: 20  16 17   Temp: 98 F (36.7 C)   98.3 F (36.8 C)  TempSrc: Oral   Oral  SpO2: 97% 95% 95% 95%  Weight:    (!) 158.5 kg (349 lb 6.9 oz)  Height:       General exam: Appears calm and comfortable NAD Cooperative. Respiratory system:  Respiratory effort normal. Cardiovascular system: S1 & S2 heard, RRR. No JVD, murmurs, rubs, gallops or clicks. 2+ pedal edema. Gastrointestinal system: Abdomen is nondistended, soft and nontender. No organomegaly or masses felt. Normal bowel sounds heard. Central nervous system: Alert and oriented. No focal neurological deficits. Extremities: 2+ pitting edema BLEs.  Symmetric 5 x 5 power. Skin: No rashes, lesions or ulcers Psychiatry: Judgement and insight appear normal. Mood & affect appropriate.    The results of significant  diagnostics from this hospitalization (including imaging, microbiology, ancillary and laboratory) are listed below for reference.     Microbiology: Recent Results (from the past 240 hour(s))  C difficile quick scan w PCR reflex     Status: Abnormal   Collection Time: 04/21/17  9:00 AM  Result Value Ref Range Status   C Diff antigen POSITIVE (A) NEGATIVE Final   C Diff toxin NEGATIVE NEGATIVE Final   C Diff interpretation Results are indeterminate. See PCR results.  Final  Clostridium Difficile by PCR     Status: None   Collection Time: 04/21/17  9:00 AM  Result Value Ref Range Status   Toxigenic C Difficile by pcr NEGATIVE NEGATIVE Final    Comment: Patient is colonized with non toxigenic C. difficile. May not need treatment unless significant symptoms are present. Performed at [facility], [place] [address], [place], [place] [address]      Labs: BNP (last 3 results)  Recent Labs  04/05/17 1738 04/20/17 1606  BNP 820.0* 767.3*   Basic Metabolic Panel:  Recent Labs Lab 04/21/17 0443 04/22/17 0517 04/23/17 0558 04/24/17 0552 04/25/17 0630  NA 145 142 143 142 144  K 4.5 4.2 4.3 4.0 4.0  CL 114* 108 109 105 105  CO2 22 25 25 28 27   GLUCOSE 115* 133* 111* 114* 126*  BUN 61* 49* 53* 40* 44*  CREATININE 5.49* 4.85* 5.19* 4.33* 5.04*  CALCIUM 8.9 8.7* 8.9 9.0 9.4  PHOS  --  5.4*  5.4* 4.4 5.1*   Liver Function Tests:  Recent Labs Lab 04/20/17 1606 04/21/17 0443 04/22/17 0517 04/23/17 0558 04/24/17 0552 04/25/17 0630  AST 11* 11*  --  13*  --   --   ALT 14 12*  --  12*  --   --   ALKPHOS 64 56  --  61  --   --   BILITOT 0.5 0.6  --  0.5  --   --   PROT 6.7 6.1*  --  6.4*  --   --   ALBUMIN 3.4* 3.1* 2.9* 3.1* 3.2* 3.3*   No results for input(s): LIPASE, AMYLASE in the last 168 hours. No results for input(s): AMMONIA in the last 168 hours. CBC:  Recent Labs Lab 04/20/17 1606 04/21/17 0443 04/22/17 0517 04/23/17 0558 04/24/17 0552 04/25/17 0630   WBC 4.3 3.8* 3.8* 4.0 4.0 4.0  NEUTROABS 2.5  --   --  1.7  --   --   HGB 9.1* 8.7* 8.0* 8.1* 8.3* 8.3*  HCT 29.8* 29.0* 27.4* 28.1* 28.3* 28.0*  MCV 85.4 85.8 87.3 87.3 85.0 85.4  PLT 141* 138* 137* 157 162 159   Cardiac Enzymes: No results for input(s): CKTOTAL, CKMB, CKMBINDEX, TROPONINI in the last 168 hours. BNP: Invalid input(s): POCBNP CBG:  Recent Labs Lab 04/24/17 0747 04/24/17 1103 04/24/17 1633 04/24/17 1949 04/25/17 0727  GLUCAP 105* 128* 122* 124* 112*   D-Dimer No results for input(s): DDIMER in the last 72 hours. Hgb A1c No results for input(s): HGBA1C in the last 72 hours. Lipid Profile No results for input(s): CHOL, HDL, LDLCALC, TRIG, CHOLHDL, LDLDIRECT in the last 72 hours. Thyroid function studies No results for input(s): TSH, T4TOTAL, T3FREE, THYROIDAB in the last 72 hours.  Invalid input(s): FREET3 Anemia work up No results for input(s): VITAMINB12, FOLATE, FERRITIN, TIBC, IRON, RETICCTPCT in the last 72 hours. Urinalysis    Component Value Date/Time   COLORURINE YELLOW 04/22/2017 1200   APPEARANCEUR CLEAR 04/22/2017 1200   LABSPEC 1.008 04/22/2017 1200   PHURINE 5.0 04/22/2017 1200   GLUCOSEU NEGATIVE 04/22/2017 1200   HGBUR NEGATIVE 04/22/2017 1200   BILIRUBINUR NEGATIVE 04/22/2017 1200   KETONESUR NEGATIVE 04/22/2017 1200   PROTEINUR 100 (A) 04/22/2017 1200   UROBILINOGEN 0.2 03/17/2015 2236   NITRITE NEGATIVE 04/22/2017 1200   LEUKOCYTESUR NEGATIVE 04/22/2017 1200   Sepsis Labs Invalid input(s): PROCALCITONIN,  WBC,  LACTICIDVEN Microbiology Recent Results (from the past 240 hour(s))  C difficile quick scan w PCR reflex     Status: Abnormal   Collection Time: 04/21/17  9:00 AM  Result Value Ref Range Status   C Diff antigen POSITIVE (A) NEGATIVE Final   C Diff toxin NEGATIVE NEGATIVE Final   C Diff interpretation Results are indeterminate. See PCR results.  Final  Clostridium Difficile by PCR     Status: None   Collection Time:  04/21/17  9:00 AM  Result Value Ref Range Status   Toxigenic C Difficile by pcr NEGATIVE NEGATIVE Final    Comment: Patient is colonized with non toxigenic C. difficile. May not need treatment unless significant symptoms are present. Performed at [facility], [place] [address] [place], [place] [address]    Time coordinating discharge: 35 mins  SIGNED:  Irwin Brakeman, MD  [facility] 04/25/2017, 9:41 AM Pager [phone]  If 7PM-7AM, please contact night-coverage [email or web address] Password [email or web address]

## 2017-05-24 DIAGNOSIS — D61818 Other pancytopenia: Secondary | ICD-10-CM | POA: Insufficient documentation

## 2017-05-24 DIAGNOSIS — I509 Heart failure, unspecified: Secondary | ICD-10-CM | POA: Insufficient documentation

## 2017-05-24 DIAGNOSIS — D631 Anemia in chronic kidney disease: Secondary | ICD-10-CM | POA: Insufficient documentation

## 2017-05-24 DIAGNOSIS — J449 Chronic obstructive pulmonary disease, unspecified: Secondary | ICD-10-CM | POA: Insufficient documentation

## 2017-05-24 DIAGNOSIS — M9080 Osteopathy in diseases classified elsewhere, unspecified site: Secondary | ICD-10-CM | POA: Insufficient documentation

## 2017-05-24 DIAGNOSIS — N189 Chronic kidney disease, unspecified: Secondary | ICD-10-CM | POA: Insufficient documentation

## 2017-06-03 ENCOUNTER — Other Ambulatory Visit: Payer: Self-pay

## 2017-06-03 DIAGNOSIS — T82510A Breakdown (mechanical) of surgically created arteriovenous fistula, initial encounter: Secondary | ICD-10-CM

## 2017-07-07 ENCOUNTER — Emergency Department (HOSPITAL_COMMUNITY)
Admission: EM | Admit: 2017-07-07 | Discharge: 2017-07-07 | Disposition: A | Discharge status: Home / Self Care | Payer: Medicaid Other | Attending: Emergency Medicine | Admitting: Emergency Medicine

## 2017-07-07 ENCOUNTER — Other Ambulatory Visit: Payer: Self-pay

## 2017-07-07 ENCOUNTER — Encounter (HOSPITAL_COMMUNITY): Payer: Self-pay | Admitting: Emergency Medicine

## 2017-07-07 ENCOUNTER — Emergency Department (HOSPITAL_COMMUNITY): Payer: Medicaid Other

## 2017-07-07 DIAGNOSIS — E119 Type 2 diabetes mellitus without complications: Secondary | ICD-10-CM | POA: Diagnosis not present

## 2017-07-07 DIAGNOSIS — I509 Heart failure, unspecified: Secondary | ICD-10-CM | POA: Insufficient documentation

## 2017-07-07 DIAGNOSIS — Z87891 Personal history of nicotine dependence: Secondary | ICD-10-CM | POA: Diagnosis not present

## 2017-07-07 DIAGNOSIS — N185 Chronic kidney disease, stage 5: Secondary | ICD-10-CM | POA: Insufficient documentation

## 2017-07-07 DIAGNOSIS — H538 Other visual disturbances: Secondary | ICD-10-CM

## 2017-07-07 DIAGNOSIS — I132 Hypertensive heart and chronic kidney disease with heart failure and with stage 5 chronic kidney disease, or end stage renal disease: Secondary | ICD-10-CM | POA: Diagnosis not present

## 2017-07-07 DIAGNOSIS — Z79899 Other long term (current) drug therapy: Secondary | ICD-10-CM | POA: Diagnosis not present

## 2017-07-07 DIAGNOSIS — J449 Chronic obstructive pulmonary disease, unspecified: Secondary | ICD-10-CM | POA: Insufficient documentation

## 2017-07-07 LAB — BASIC METABOLIC PANEL
Anion gap: 10 (ref 5–15)
BUN: 27 mg/dL — AB (ref 6–20)
CHLORIDE: 100 mmol/L — AB (ref 101–111)
CO2: 28 mmol/L (ref 22–32)
CREATININE: 3.05 mg/dL — AB (ref 0.44–1.00)
Calcium: 8.9 mg/dL (ref 8.9–10.3)
GFR calc Af Amer: 20 mL/min — ABNORMAL LOW (ref >60)
GFR calc non Af Amer: 17 mL/min — ABNORMAL LOW (ref >60)
GLUCOSE: 188 mg/dL — AB (ref 65–99)
Potassium: 3.6 mmol/L (ref 3.5–5.1)
Sodium: 138 mmol/L (ref 135–145)

## 2017-07-07 LAB — CBC WITH DIFFERENTIAL/PLATELET
Basophils Absolute: 0 10*3/uL (ref 0.0–0.1)
Basophils Relative: 0 %
Eosinophils Absolute: 0.1 10*3/uL (ref 0.0–0.7)
Eosinophils Relative: 2 %
HEMATOCRIT: 34.1 % — AB (ref 36.0–46.0)
HEMOGLOBIN: 10.2 g/dL — AB (ref 12.0–15.0)
LYMPHS ABS: 1.3 10*3/uL (ref 0.7–4.0)
Lymphocytes Relative: 25 %
MCH: 25.6 pg — AB (ref 26.0–34.0)
MCHC: 29.9 g/dL — AB (ref 30.0–36.0)
MCV: 85.7 fL (ref 78.0–100.0)
MONO ABS: 0.4 10*3/uL (ref 0.1–1.0)
Monocytes Relative: 8 %
NEUTROS ABS: 3.2 10*3/uL (ref 1.7–7.7)
NEUTROS PCT: 65 %
Platelets: 168 10*3/uL (ref 150–400)
RBC: 3.98 MIL/uL (ref 3.87–5.11)
RDW: 17.4 % — ABNORMAL HIGH (ref 11.5–15.5)
WBC: 5 10*3/uL (ref 4.0–10.5)

## 2017-07-07 LAB — CBG MONITORING, ED: Glucose-Capillary: 148 mg/dL — ABNORMAL HIGH (ref 65–99)

## 2017-07-07 MED ORDER — HYDROMORPHONE HCL 1 MG/ML IJ SOLN
1.0000 mg | Freq: Once | INTRAMUSCULAR | Status: AC
  Administered 2017-07-07: 1 mg via INTRAMUSCULAR
  Filled 2017-07-07: qty 1

## 2017-07-07 NOTE — ED Notes (Signed)
Pt updated on wait at this time, states understanding.

## 2017-07-07 NOTE — ED Notes (Signed)
Dr zammitt aware of pt. Orders to be seen prior to ct scan

## 2017-07-07 NOTE — ED Triage Notes (Signed)
Pt states started getting blurred vision one month ago. States has been getting worse. Did not get it checked because no one told her to come. Was at dialysis and they recommended her come here. A/o. Nad.denies any other neuro deficits.

## 2017-07-07 NOTE — Discharge Instructions (Signed)
Follow up with the eye md as planned.   You could call them and see if you can get an appointment this week.

## 2017-07-09 ENCOUNTER — Other Ambulatory Visit: Payer: Self-pay | Admitting: *Deleted

## 2017-07-09 ENCOUNTER — Encounter: Payer: Self-pay | Admitting: *Deleted

## 2017-07-09 ENCOUNTER — Encounter: Payer: Self-pay | Admitting: Vascular Surgery

## 2017-07-09 ENCOUNTER — Ambulatory Visit (HOSPITAL_COMMUNITY)
Admission: RE | Admit: 2017-07-09 | Discharge: 2017-07-09 | Disposition: A | Discharge status: Home / Self Care | Payer: Medicaid Other | Source: Ambulatory Visit | Attending: Vascular Surgery | Admitting: Vascular Surgery

## 2017-07-09 ENCOUNTER — Ambulatory Visit (INDEPENDENT_AMBULATORY_CARE_PROVIDER_SITE_OTHER): Payer: Medicaid Other | Admitting: Vascular Surgery

## 2017-07-09 VITALS — BP 181/95 | HR 69 | Temp 98.2°F | Resp 16 | Ht 68.0 in | Wt 289.0 lb

## 2017-07-09 DIAGNOSIS — Y832 Surgical operation with anastomosis, bypass or graft as the cause of abnormal reaction of the patient, or of later complication, without mention of misadventure at the time of the procedure: Secondary | ICD-10-CM | POA: Diagnosis not present

## 2017-07-09 DIAGNOSIS — N186 End stage renal disease: Secondary | ICD-10-CM | POA: Diagnosis not present

## 2017-07-09 DIAGNOSIS — T82510A Breakdown (mechanical) of surgically created arteriovenous fistula, initial encounter: Secondary | ICD-10-CM | POA: Diagnosis present

## 2017-07-09 DIAGNOSIS — Z992 Dependence on renal dialysis: Secondary | ICD-10-CM

## 2017-07-09 NOTE — ED Provider Notes (Signed)
[facility] EMERGENCY DEPARTMENT Provider Note   CSN: [id number] Arrival date & time: 07/07/17  1143     History   Chief Complaint Chief Complaint  Patient presents with  . Visual Field Change    HPI Martha [name] is a 51 y.o. female.  Patient states that she has been having blurred vision continuously for approximately 6 weeks now.  She spoke with her family doctor who has made arrangements for her to see an eye doctor next week but recommended coming for evaluation here.  The patient is not getting any worse or any better it has been the same for the last 6 weeks minimal headache which has occurred just recently   The history is provided by the patient.  Illness  This is a new problem. The current episode started more than 1 week ago. The problem occurs constantly. The problem has not changed since onset.Pertinent negatives include no chest pain, no abdominal pain and no headaches. Nothing aggravates the symptoms. Nothing relieves the symptoms. She has tried nothing for the symptoms. The treatment provided no relief.    Past Medical History:  Diagnosis Date  . Acid reflux    takes Tums  . Anemia   . Arthritis   . Bipolar 1 disorder ([place])   . Cervical radiculopathy   . CHF (congestive heart failure) ([place])   . Chronic kidney disease    Stage 5- 01/25/17  . COPD (chronic obstructive pulmonary disease) ([place]) 2014   bronchitis  . Degenerative disc disease, thoracic   . Depression   . Diabetes mellitus    Type II  . Hypertension   . Noncompliance with medication regimen   . Obesity (BMI 30-39.9)   . OSA (obstructive sleep apnea)    cpap  . Panic attack   . RLS (restless legs syndrome)     Patient Active Problem List   Diagnosis Date Noted  . Renal failure (ARF), acute on chronic (HCC) 04/22/2017  . Anasarca associated with disorder of kidney 04/20/2017  . Metabolic encephalopathy 81/07/7508  . Acute pulmonary edema ([place]) 04/05/2017  . Acute psychosis ([place])  12/01/2016  . Protein-calorie malnutrition, severe 11/29/2016  . Confusion 11/22/2016  . HTN (hypertension), malignant 11/20/2016  . Diabetes mellitus 11/20/2016  . Bipolar 1 disorder ([place]) 11/20/2016  . Noncompliance with medication regimen 11/20/2016  . Panic attack 11/20/2016  . CKD stage 5 secondary to hypertension ([place]) 11/15/2016  . AKI (acute kidney injury) ([place]) 11/15/2016  . Chest pain 10/27/2016  . CKD (chronic kidney disease) stage 5, GFR less than 15 ml/min (HCC) 10/27/2016  . Uncontrolled type 2 diabetes mellitus with hyperglycemia, without long-term current use of insulin ([place]) 10/27/2016  . Sensory disturbance 10/27/2016  . Left hemiparesis ([place]) 10/27/2016  . Acute on chronic renal failure ([place]) 05/25/2016  . Abdominal pain 04/26/2014  . Cholelithiasis 04/26/2014  . ARF (acute renal failure) ([place]) 04/26/2014  . Dehydration 04/26/2014  . Hyponatremia 04/26/2014  . Gallstones 04/06/2014  . Uncontrolled diabetes mellitus ([place]) 10/09/2013  . Breast abscess 05/30/2013  . Diabetes mellitus, type 2 ([place]) 05/30/2013  . Hyperglycemia 05/30/2013  . Internal hemorrhoids with other complication 25/85/2778  . Umbilical hernia without mention of obstruction or gangrene 04/15/2013  . Anemia 04/15/2013  . DM 05/13/2010  . Hyperlipidemia 05/13/2010  . Morbid obesity ([place]) 05/13/2010  . Depression 05/13/2010  . RESTLESS LEG SYNDROME 05/13/2010  . Essential hypertension 05/13/2010  . GERD 05/13/2010  . RECTAL BLEEDING 05/13/2010  . Sleep apnea 05/13/2010  .  Dysphagia, oropharyngeal phase 05/13/2010    Past Surgical History:  Procedure Laterality Date  . AV FISTULA PLACEMENT Right 01/27/2017   Procedure: ARTERIOVENOUS (AV) FISTULA CREATION-RIGHT ARM;  Surgeon: Elam Dutch, MD;  Location: [place];  Service: Vascular;  Laterality: Right;  . BIOPSY N/A 05/17/2013   Procedure: BIOPSY;  Surgeon: [name], MD;  [facility];  Service: Endoscopy;  Laterality: N/A;  .  CHOLECYSTECTOMY N/A 04/27/2014   Procedure: LAPAROSCOPIC CHOLECYSTECTOMY;  Surgeon: [name], MD;  Location: [facility];  Service: General;  Laterality: N/A;  . COLONOSCOPY  06/06/2010   NID:POEUMP bleeding secondary to internal hemorrhoids but incomplete evaluation secondary to poor right colon prep/small rectal and sigmoid colon polyps (hyperplastic). PROPOFOL  . COLONOSCOPY  May 2013   Dr. Gilliam/[facility]: 5 mm a descending colon polyp, hyperplastic. Adequate bowel prep.  . ENDOMETRIAL ABLATION    . ESOPHAGOGASTRODUODENOSCOPY  06/06/2010   NTI:[id number] erythema and edema of body of stomach, with sessile polypoid lesions. bx benign. no h.pylori  . ESOPHAGOGASTRODUODENOSCOPY (EGD) WITH PROPOFOL N/A 05/17/2013   Dr. Oneida Alar: normal esophagus, moderate nodular gastritis, negative path, empiric Savary dilation  . FLEXIBLE SIGMOIDOSCOPY N/A 05/17/2013   Dr. Oneida Alar: moderate sized internal hemorrhoids  . HEMORRHOID BANDING N/A 05/17/2013   Procedure: HEMORRHOID BANDING;  Surgeon: [name], MD;  [facility];  Service: Endoscopy;  Laterality: N/A;  2 bands placed  . INCISION AND DRAINAGE ABSCESS Left 10/11/2013   Procedure: INCISION AND DRAINAGE AND DEBRIDEMENT LEFT BREAST  ABSCESS;  Surgeon: [name], MD;  Location: [facility];  Service: General;  Laterality: Left;  . IRRIGATION AND DEBRIDEMENT ABSCESS Right 06/01/2013   Procedure: INCISION AND DRAINAGE AND DEBRIDEMENT ABSCESS RIGHT BREAST;  Surgeon: [name], MD;  [facility];  Service: General;  Laterality: Right;  . SAVORY DILATION N/A 05/17/2013   Procedure: SAVORY DILATION;  Surgeon: [name], MD;  Location: [facility];  Service: Endoscopy;  Laterality: N/A;  14/15/16  . TUBAL LIGATION      OB History    Gravida Para Term Preterm AB Living   2 2 1 1   2    SAB TAB Ectopic Multiple Live Births                   Home Medications    Prior to Admission medications   Medication Sig Start Date End Date  Taking? Authorizing Provider  albuterol (PROVENTIL HFA;VENTOLIN HFA) 108 (90 Base) MCG/ACT inhaler Inhale 2 puffs into the lungs every 4 (four) hours as needed for wheezing or shortness of breath. 07/23/16  Yes Francine Graven, DO  ALPRAZolam Duanne Moron) 1 MG tablet Take 1 mg by mouth 4 (four) times daily as needed for anxiety (nerves).   Yes [provider]  atorvastatin (LIPITOR) 10 MG tablet Take 10 mg by mouth daily. Take along with 20 mg tablet to equal 30 mg daily.   Yes [provider]  atorvastatin (LIPITOR) 20 MG tablet Take 20 mg by mouth daily. With Atorvastatin 10 mg to equal 30 mg total daily   Yes [provider]  calcium carbonate (TUMS EX) 750 MG chewable tablet Chew 1-2 tablets by mouth 6 (six) times daily. Patient takes 2 tablets with meals and 1 tablet with snacks three times a day   Yes [provider]  carvedilol (COREG) 12.5 MG tablet Take 1 tablet (12.5 mg total) by mouth 2 (two) times daily with a meal. 05/26/16  Yes Joette Catching  T, MD  colchicine 0.6 MG tablet Take 1 tablet (0.6 mg total) by mouth every other day. 11/19/16  Yes Sinda Du, MD  fluticasone [facility]) 50 MCG/ACT nasal spray Place 2 sprays into both nostrils at bedtime.   Yes [provider]  hydrALAZINE (APRESOLINE) 25 MG tablet Take 1 tablet (25 mg total) by mouth every 8 (eight) hours. Patient taking differently: Take 25 mg by mouth 2 (two) times daily.  10/29/16  Yes Sinda Du, MD  HYDROcodone-acetaminophen [facility]) 10-325 MG tablet Take 1 tablet by mouth every 6 (six) hours as needed for moderate pain. 01/27/17  Yes Rhyne, Hulen Shouts, PA-C  lidocaine-prilocaine (EMLA) cream Apply 1 application topically as needed (topical anesthesia for hemodialysis if Gebauers and Lidocaine injection are ineffective.). 04/25/17  Yes Johnson, Clanford L, MD  loperamide (IMODIUM) 2 MG capsule Take 1 capsule (2 mg total) by mouth 4 (four) times daily as needed for diarrhea or  loose stools. 10/23/16  Yes Idol, Almyra Free, PA-C  PARoxetine (PAXIL) 20 MG tablet Take 1 daily. This is to prevent panic attacks Patient taking differently: Take 20-40 mg by mouth daily. Take 1 daily. This is to prevent panic attacks 10/28/16  Yes Sinda Du, MD  risperiDONE (RISPERDAL) 0.5 MG tablet Take 1 tablet (0.5 mg total) by mouth 2 (two) times daily. 12/01/16  Yes Sinda Du, MD  rOPINIRole (REQUIP) 1 MG tablet Take 1 mg by mouth at bedtime.    Yes [provider]    Family History Family History  Adopted: Yes  Family history unknown: Yes    Social History Social History   Tobacco Use  . Smoking status: Former Smoker    Packs/day: 1.50    Years: 25.00    Pack years: 37.50    Types: Cigarettes    Last attempt to quit: 10/23/2007    Years since quitting: 9.7  . Smokeless tobacco: Never Used  . Tobacco comment: Quit x 6 years ago  Substance Use Topics  . Alcohol use: No  . Drug use: No     Allergies   Codeine and Tape   Review of Systems Review of Systems  Constitutional: Negative for appetite change and fatigue.  HENT: Negative for congestion, ear discharge and sinus pressure.        Blurred vision  Eyes: Negative for discharge.  Respiratory: Negative for cough.   Cardiovascular: Negative for chest pain.  Gastrointestinal: Negative for abdominal pain and diarrhea.  Genitourinary: Negative for frequency and hematuria.  Musculoskeletal: Negative for back pain.  Skin: Negative for rash.  Neurological: Negative for seizures and headaches.  Psychiatric/Behavioral: Negative for hallucinations.     Physical Exam Updated Vital Signs BP 137/80 (BP Location: Left Arm)   Pulse 66   Temp 98.3 F (36.8 C) (Oral)   Resp 16   Wt 132.5 kg (292 lb)   SpO2 94%   BMI 44.40 kg/m   Physical Exam  Constitutional: She is oriented to person, place, and time. She appears well-developed.  HENT:  Head: Normocephalic.  Pupils equal round reactive to light and  accommodation extraocular muscles intact.  Discs sharp.  Intraocular pressure on the right is 18 and on the left is 20.  Patient can only see shadows.  Eyes: Conjunctivae and EOM are normal. No scleral icterus.  Neck: Neck supple. No thyromegaly present.  Cardiovascular: Normal rate and regular rhythm. Exam reveals no gallop and no friction rub.  No murmur heard. Pulmonary/Chest: No stridor. She has no wheezes. She has no  rales. She exhibits no tenderness.  Abdominal: She exhibits no distension. There is no tenderness. There is no rebound.  Musculoskeletal: Normal range of motion. She exhibits no edema.  Lymphadenopathy:    She has no cervical adenopathy.  Neurological: She is oriented to person, place, and time. She exhibits normal muscle tone. Coordination normal.  Skin: No rash noted. No erythema.  Psychiatric: She has a normal mood and affect. Her behavior is normal.     ED Treatments / Results  Labs (all labs ordered are listed, but only abnormal results are displayed) Labs Reviewed  CBC WITH DIFFERENTIAL/PLATELET - Abnormal; Notable for the following components:      Result Value   Hemoglobin 10.2 (*)    HCT 34.1 (*)    MCH 25.6 (*)    MCHC 29.9 (*)    RDW 17.4 (*)    All other components within normal limits  BASIC METABOLIC PANEL - Abnormal; Notable for the following components:   Chloride 100 (*)    Glucose, Bld 188 (*)    BUN 27 (*)    Creatinine, Ser 3.05 (*)    GFR calc non Af Amer 17 (*)    GFR calc Af Amer 20 (*)    All other components within normal limits  CBG MONITORING, ED - Abnormal; Notable for the following components:   Glucose-Capillary 148 (*)    All other components within normal limits    EKG  EKG Interpretation None       Radiology Ct Head Wo Contrast  Result Date: 07/07/2017 CLINICAL DATA:  Acute onset severe headache. Recent blurred vision. Chronic renal failure. EXAM: CT HEAD WITHOUT CONTRAST TECHNIQUE: Contiguous axial images were  obtained from the base of the skull through the vertex without intravenous contrast. COMPARISON:  Head CT November 22, 2016 and brain MRI November 24, 2016 FINDINGS: Brain: The ventricles are normal in size and configuration. There is no intracranial mass, hemorrhage, extra-axial fluid collection, or midline shift. Gray-white compartments appear normal. No evident acute infarct. Vascular: There is no hyperdense vessel evident. Slight calcification is again noted in the left carotid siphon region. Skull: Bony calvarium appears intact. Sinuses/Orbits: Visualized paranasal sinuses are clear. Orbits appear symmetric bilaterally. Other: Mastoid air cells are clear. IMPRESSION: Slight arterial vascular calcification. Study otherwise unremarkable and stable. Electronically Signed   By: Lowella Grip III M.D.   On: 07/07/2017 14:27    Procedures Procedures (including critical care time)  Medications Ordered in ED Medications  HYDROmorphone (DILAUDID) injection 1 mg (1 mg Intramuscular Given 07/07/17 1410)     Initial Impression / Assessment and Plan / ED Course  I have reviewed the triage vital signs and the nursing notes.  Pertinent labs & imaging results that were available during my care of the patient were reviewed by me and considered in my medical decision making (see chart for details).     Patient with 82-month history of blurred vision.  Unsure of the cause.  It is not getting any worse or any better.  Patient has an appointment with ophthalmology in a week I told over the call the ophthalmologist to see if she can get in sooner.  Final Clinical Impressions(s) / ED Diagnoses   Final diagnoses:  Visual blurriness    ED Discharge Orders    None       Milton Ferguson, MD 07/09/17 1057

## 2017-07-09 NOTE — Progress Notes (Signed)
Patient is a 51 year old female who presents today complaining of numbness and tingling in her right hand.  She states that this has been present since the placement of her AV fistula January 27, 2017.  She states it has become progressively worse.  She has weakness in her hand and has difficulty feeding herself.  She is right-handed.  The numbness and tingling can be exacerbated with certain positions.  She currently dialyzes on Monday Wednesday and Friday.  Review of systems: The patient states that she has had recent worsening of her vision and states she has cataracts that will need to be fixed soon.  He denies shortness of breath or chest pain.  Past Medical History:  Diagnosis Date  . Acid reflux    takes Tums  . Anemia   . Arthritis   . Bipolar 1 disorder ([place])   . Cervical radiculopathy   . CHF (congestive heart failure) ([place])   . Chronic kidney disease    Stage 5- 01/25/17  . COPD (chronic obstructive pulmonary disease) ([place]) 2014   bronchitis  . Degenerative disc disease, thoracic   . Depression   . Diabetes mellitus    Type II  . Hypertension   . Noncompliance with medication regimen   . Obesity (BMI 30-39.9)   . OSA (obstructive sleep apnea)    cpap  . Panic attack   . RLS (restless legs syndrome)     Past Surgical History:  Procedure Laterality Date  . AV FISTULA PLACEMENT Right 01/27/2017   Procedure: ARTERIOVENOUS (AV) FISTULA CREATION-RIGHT ARM;  Surgeon: Elam Dutch, MD;  Location: [place];  Service: Vascular;  Laterality: Right;  . BIOPSY N/A 05/17/2013   Procedure: BIOPSY;  Surgeon: [name], MD;  [facility];  Service: Endoscopy;  Laterality: N/A;  . CHOLECYSTECTOMY N/A 04/27/2014   Procedure: LAPAROSCOPIC CHOLECYSTECTOMY;  Surgeon: [name], MD;  [facility];  Service: General;  Laterality: N/A;  . COLONOSCOPY  06/06/2010   XHB:[id number] bleeding secondary to internal hemorrhoids but incomplete evaluation secondary to poor right colon  prep/small rectal and sigmoid colon polyps (hyperplastic). PROPOFOL  . COLONOSCOPY  May 2013   Dr. Gilliam/[facility]: 5 mm a descending colon polyp, hyperplastic. Adequate bowel prep.  . ENDOMETRIAL ABLATION    . ESOPHAGOGASTRODUODENOSCOPY  06/06/2010   ELF:[id number] erythema and edema of body of stomach, with sessile polypoid lesions. bx benign. no h.pylori  . ESOPHAGOGASTRODUODENOSCOPY (EGD) WITH PROPOFOL N/A 05/17/2013   Dr. Oneida Alar: normal esophagus, moderate nodular gastritis, negative path, empiric Savary dilation  . FLEXIBLE SIGMOIDOSCOPY N/A 05/17/2013   Dr. Oneida Alar: moderate sized internal hemorrhoids  . HEMORRHOID BANDING N/A 05/17/2013   Procedure: HEMORRHOID BANDING;  Surgeon: [name], MD;  [facility];  Service: Endoscopy;  Laterality: N/A;  2 bands placed  . INCISION AND DRAINAGE ABSCESS Left 10/11/2013   Procedure: INCISION AND DRAINAGE AND DEBRIDEMENT LEFT BREAST  ABSCESS;  Surgeon: [name], MD;  [facility];  Service: General;  Laterality: Left;  . IRRIGATION AND DEBRIDEMENT ABSCESS Right 06/01/2013   Procedure: INCISION AND DRAINAGE AND DEBRIDEMENT ABSCESS RIGHT BREAST;  Surgeon: [name], MD;  [facility];  Service: General;  Laterality: Right;  . SAVORY DILATION N/A 05/17/2013   Procedure: SAVORY DILATION;  Surgeon: [name], MD;  [facility];  Service: Endoscopy;  Laterality: N/A;  14/15/16  . TUBAL LIGATION     Current Outpatient Medications on File Prior to Visit  Medication Sig Dispense  Refill  . albuterol (PROVENTIL HFA;VENTOLIN HFA) 108 (90 Base) MCG/ACT inhaler Inhale 2 puffs into the lungs every 4 (four) hours as needed for wheezing or shortness of breath. 1 Inhaler 0  . ALPRAZolam (XANAX) 1 MG tablet Take 1 mg by mouth 4 (four) times daily as needed for anxiety (nerves).    Marland Kitchen atorvastatin (LIPITOR) 10 MG tablet Take 10 mg by mouth daily. Take along with 20 mg tablet to equal 30 mg daily.    Marland Kitchen atorvastatin (LIPITOR) 20 MG  tablet Take 20 mg by mouth daily. With Atorvastatin 10 mg to equal 30 mg total daily    . calcium carbonate (TUMS EX) 750 MG chewable tablet Chew 1-2 tablets by mouth 6 (six) times daily. Patient takes 2 tablets with meals and 1 tablet with snacks three times a day    . carvedilol (COREG) 12.5 MG tablet Take 1 tablet (12.5 mg total) by mouth 2 (two) times daily with a meal. 60 tablet 0  . colchicine 0.6 MG tablet Take 1 tablet (0.6 mg total) by mouth every other day. 16 tablet 12  . fluticasone (FLONASE) 50 MCG/ACT nasal spray Place 2 sprays into both nostrils at bedtime.    . hydrALAZINE (APRESOLINE) 25 MG tablet Take 1 tablet (25 mg total) by mouth every 8 (eight) hours. (Patient taking differently: Take 25 mg by mouth 2 (two) times daily. ) 90 tablet 12  . HYDROcodone-acetaminophen (NORCO) 10-325 MG tablet Take 1 tablet by mouth every 6 (six) hours as needed for moderate pain. 6 tablet 0  . lidocaine-prilocaine (EMLA) cream Apply 1 application topically as needed (topical anesthesia for hemodialysis if Gebauers and Lidocaine injection are ineffective.). 30 g 0  . loperamide (IMODIUM) 2 MG capsule Take 1 capsule (2 mg total) by mouth 4 (four) times daily as needed for diarrhea or loose stools. 12 capsule 0  . PARoxetine (PAXIL) 20 MG tablet Take 1 daily. This is to prevent panic attacks (Patient taking differently: Take 20-40 mg by mouth daily. Take 1 daily. This is to prevent panic attacks) 30 tablet 12  . risperiDONE (RISPERDAL) 0.5 MG tablet Take 1 tablet (0.5 mg total) by mouth 2 (two) times daily.    Marland Kitchen rOPINIRole (REQUIP) 1 MG tablet Take 1 mg by mouth at bedtime.      No current facility-administered medications on file prior to visit.     Physical exam:  Vitals:   07/09/17 1543  BP: (!) 181/95  Pulse: 69  Resp: 16  Temp: 98.2 F (36.8 C)  TempSrc: Oral  SpO2: 94%  Weight: 289 lb (131.1 kg)  Height: 5\' 8"  (1.727 m)    Right upper extremity: Palpable thrill in fistula.  No  palpable radial pulse right hand.  She does have capillary refill in the right hand.  Data: Patient had a duplex ultrasound of her AV fistula today.  She did have augmentation of flow in the radial artery with compression of the fistula suggesting some component of steal.  Assessment: Right arm AV fistula causing significant steal symptoms right hand.  I do not believe we should consider banding of this since she is right-hand dominant and is fairly debilitated by her current symptoms.  Plan: Ligation right arm AV fistula placement of dialysis catheter August 04, 2016.  I offered the patient several operative dates prior to this with all my partners but she wanted to wait until I was available which would be January 8.  We will plan a new access for  her left upper extremity after she has recovered from this operation.  Ruta Hinds, MD [facility] Office: [phone] Pager: [phone]

## 2017-07-28 HISTORY — PX: EYE SURGERY: SHX253

## 2017-08-01 NOTE — Progress Notes (Signed)
Called patient for SDW call. Patient states she is cancelling surgery. Patient states she has left several messages with Dr. Oneida Alar office. I requested that patient contact Dr. Oneida Alar office on Monday and tell them she needs to speak to someone about surgery. I advised her to tell them she cannot leave a voicemail. Patient verbalized understanding and states she will contact office on Monday.

## 2017-08-04 ENCOUNTER — Ambulatory Visit: Admit: 2017-08-04 | Payer: Medicaid Other | Admitting: Vascular Surgery

## 2017-08-04 SURGERY — LIGATION OF ARTERIOVENOUS  FISTULA
Anesthesia: Choice | Laterality: Right

## 2017-09-11 ENCOUNTER — Encounter: Payer: Self-pay | Admitting: Gastroenterology

## 2017-09-24 ENCOUNTER — Encounter: Payer: Self-pay | Admitting: Gastroenterology

## 2017-10-08 ENCOUNTER — Encounter: Payer: Self-pay | Admitting: Gastroenterology

## 2017-10-08 ENCOUNTER — Ambulatory Visit (INDEPENDENT_AMBULATORY_CARE_PROVIDER_SITE_OTHER): Payer: Medicaid Other | Admitting: Gastroenterology

## 2017-10-08 DIAGNOSIS — K529 Noninfective gastroenteritis and colitis, unspecified: Secondary | ICD-10-CM

## 2017-10-08 MED ORDER — DICYCLOMINE HCL 10 MG PO CAPS: 10.0000 mg | ORAL_CAPSULE | Freq: Three times a day (TID) | ORAL | 3 refills | Status: DC

## 2017-10-08 NOTE — Progress Notes (Signed)
cc'ed to pcp °

## 2017-10-08 NOTE — Patient Instructions (Signed)
I am rechecking stool studies and adding a few that weren't checked at [facility]. I reviewed the stool studies from [place].I would like to have these additional ones on file.  For now, you can take Bentyl before meals and at bedtime. Monitor for constipation, dry mouth.   You may need antibiotics. We will see what the stool studies show.   If everything is negative, you will need a colonoscopy.   It was a pleasure to see you today. I strive to create trusting relationships with patients to provide genuine, compassionate, and quality care. I value your feedback. If you receive a survey regarding your visit,  I greatly appreciate you taking time to fill this out.   Annitta Needs, PhD, ANP-BC [facility]

## 2017-10-08 NOTE — Progress Notes (Signed)
Primary Care Physician:  Sinda Du, MD Primary Gastroenterologist:  Dr. Oneida Alar   Chief Complaint  Patient presents with  . Diarrhea    went to ER at [facility]    HPI:   Martha [name] is a 52 y.o. female presenting today at the request of Dr. Luan Pulling secondary to diarrhea. She was last seen by GI in Sept 2015. Last complete colonoscopy in May 2013 at [facility] with hyperplastic polyp. Flex sig in Oct 2014 with internal hemorrhoids. Last EGD with empiric dilation in 2014.   Notes diarrhea for at least the last 6 months. Just one week ago had only one episode of constipation. Prior to onset of diarrhea would have a BM every day. Nocturnal incontinence. No rectal bleeding. No abdominal pain. One day had 10 loose stools. Monday had 4 even prior to going to dialysis. Foul odor, worse than normal. Watery. No antibiotics recently or in the past 6 months. No weight loss. No nausea or vomiting. No exacerbating factors. Went to [facility] Monday evening. Cdiff PCR negative. Stool culture still pending. Took 6 imodium ADs. Popping like candy. . Stopped taking colchicine because she knew this could cause diarrhea. Has been off 3 weeks but without any improvement.   Started dialysis in October 2018. Review of epic with Cdiff antigen positive in Sept 2018 but negative PCR. Was having diarrhea at that time.   Past Medical History:  Diagnosis Date  . Acid reflux    takes Tums  . Anemia   . Arthritis   . Bipolar 1 disorder ([place])   . Cervical radiculopathy   . CHF (congestive heart failure) ([place])   . Chronic kidney disease    Stage 5- 01/25/17  . COPD (chronic obstructive pulmonary disease) ([place]) 2014   bronchitis  . Degenerative disc disease, thoracic   . Depression   . Diabetes mellitus    Type II  . Hypertension   . Noncompliance with medication regimen   . Obesity (BMI 30-39.9)   . OSA (obstructive sleep apnea)    cpap  . Panic attack   . RLS (restless legs syndrome)     Past  Surgical History:  Procedure Laterality Date  . AV FISTULA PLACEMENT Right 01/27/2017   Procedure: ARTERIOVENOUS (AV) FISTULA CREATION-RIGHT ARM;  Surgeon: Elam Dutch, MD;  Location: [place];  Service: Vascular;  Laterality: Right;  . BIOPSY N/A 05/17/2013   Procedure: BIOPSY;  Surgeon: [name], MD;  [facility]: [facility];  Service: Endoscopy;  Laterality: N/A;  . CHOLECYSTECTOMY N/A 04/27/2014   Procedure: LAPAROSCOPIC CHOLECYSTECTOMY;  Surgeon: [name], MD;  Location: [facility];  Service: General;  Laterality: N/A;  . COLONOSCOPY  06/06/2010   POE:UMPNTI bleeding secondary to internal hemorrhoids but incomplete evaluation secondary to poor right colon prep/small rectal and sigmoid colon polyps (hyperplastic). PROPOFOL  . COLONOSCOPY  May 2013   Dr. Gilliam/[facility]: 5 mm a descending colon polyp, hyperplastic. Adequate bowel prep.  . ENDOMETRIAL ABLATION    . ESOPHAGOGASTRODUODENOSCOPY  06/06/2010   RWE:[id number] erythema and edema of body of stomach, with sessile polypoid lesions. bx benign. no h.pylori  . ESOPHAGOGASTRODUODENOSCOPY (EGD) WITH PROPOFOL N/A 05/17/2013   Dr. Oneida Alar: normal esophagus, moderate nodular gastritis, negative path, empiric Savary dilation  . FLEXIBLE SIGMOIDOSCOPY N/A 05/17/2013   Dr. Oneida Alar: moderate sized internal hemorrhoids  . HEMORRHOID BANDING N/A 05/17/2013   Procedure: HEMORRHOID BANDING;  Surgeon: [name], MD;  Location: [facility];  Service: Endoscopy;  Laterality: N/A;  2 bands placed  . INCISION AND DRAINAGE ABSCESS Left 10/11/2013   Procedure: INCISION AND DRAINAGE AND DEBRIDEMENT LEFT BREAST  ABSCESS;  Surgeon: [name], MD;  Location: [facility];  Service: General;  Laterality: Left;  . IRRIGATION AND DEBRIDEMENT ABSCESS Right 06/01/2013   Procedure: INCISION AND DRAINAGE AND DEBRIDEMENT ABSCESS RIGHT BREAST;  Surgeon: [name], MD;  Location: [facility];  Service: General;  Laterality: Right;  . SAVORY DILATION N/A 05/17/2013    Procedure: SAVORY DILATION;  Surgeon: [name], MD;  Location: [facility];  Service: Endoscopy;  Laterality: N/A;  14/15/16  . TUBAL LIGATION      Current Outpatient Medications  Medication Sig Dispense Refill  . albuterol (PROVENTIL HFA;VENTOLIN HFA) 108 (90 Base) MCG/ACT inhaler Inhale 2 puffs into the lungs every 4 (four) hours as needed for wheezing or shortness of breath. 1 Inhaler 0  . ALPRAZolam (XANAX) 1 MG tablet Take 1 mg by mouth 4 (four) times daily as needed for anxiety (nerves).    Marland Kitchen atorvastatin (LIPITOR) 10 MG tablet Take 10 mg by mouth daily. Take along with 20 mg tablet to equal 30 mg daily.    Marland Kitchen atorvastatin (LIPITOR) 20 MG tablet Take 20 mg by mouth daily. With Atorvastatin 10 mg to equal 30 mg total daily    . calcium carbonate (TUMS EX) 750 MG chewable tablet Chew 1-2 tablets by mouth 6 (six) times daily. Patient takes 3 tablets with meals and 2 tablet with snacks three times a day    . carvedilol (COREG) 12.5 MG tablet Take 1 tablet (12.5 mg total) by mouth 2 (two) times daily with a meal. 60 tablet 0  . colchicine 0.6 MG tablet Take 1 tablet (0.6 mg total) by mouth every other day. 16 tablet 12  . fluticasone (FLONASE) 50 MCG/ACT nasal spray Place 2 sprays into both nostrils at bedtime.    . hydrALAZINE (APRESOLINE) 25 MG tablet Take 1 tablet (25 mg total) by mouth every 8 (eight) hours. (Patient taking differently: Take 25 mg by mouth 2 (two) times daily. Doesn't take am dose Mon, Wed, Fri) 90 tablet 12  . HYDROcodone-acetaminophen (NORCO) 10-325 MG tablet Take 1 tablet by mouth every 6 (six) hours as needed for moderate pain. 6 tablet 0  . lidocaine-prilocaine (EMLA) cream Apply 1 application topically as needed (topical anesthesia for hemodialysis if Gebauers and Lidocaine injection are ineffective.). 30 g 0  . loperamide (IMODIUM) 2 MG capsule Take 1 capsule (2 mg total) by mouth 4 (four) times daily as needed for diarrhea or loose stools. 12 capsule 0  . PARoxetine  (PAXIL) 20 MG tablet Take 1 daily. This is to prevent panic attacks (Patient taking differently: Take 20-40 mg by mouth daily. Take 1 daily. This is to prevent panic attacks) 30 tablet 12  . risperiDONE (RISPERDAL) 0.5 MG tablet Take 1 tablet (0.5 mg total) by mouth 2 (two) times daily.    Marland Kitchen rOPINIRole (REQUIP) 1 MG tablet Take 1 mg by mouth at bedtime.      No current facility-administered medications for this visit.     Allergies as of 10/08/2017 - Review Complete 10/08/2017  Allergen Reaction Noted  . Codeine Nausea And Vomiting   . Tape Other (See Comments) 04/24/2017    Family History  Adopted: Yes  Family history unknown: Yes    Social History   Socioeconomic History  . Marital status: Divorced    Spouse name: Not on file  . Number of children:  2  . Years of education: Not on file  . Highest education level: Not on file  Social Needs  . Financial resource strain: Not on file  . Food insecurity - worry: Not on file  . Food insecurity - inability: Not on file  . Transportation needs - medical: Not on file  . Transportation needs - non-medical: Not on file  Occupational History  . Occupation: Disabled  Tobacco Use  . Smoking status: Former Smoker    Packs/day: 1.50    Years: 25.00    Pack years: 37.50    Types: Cigarettes    Last attempt to quit: 10/23/2007    Years since quitting: 9.9  . Smokeless tobacco: Never Used  . Tobacco comment: Quit x 10 years ago  Substance and Sexual Activity  . Alcohol use: No  . Drug use: No  . Sexual activity: Yes    Birth control/protection: Surgical  Other Topics Concern  . Not on file  Social History Narrative   Lives with fiance.      Review of Systems: Gen: Denies any fever, chills, fatigue, weight loss, lack of appetite.  CV: Denies chest pain, heart palpitations, peripheral edema, syncope.  Resp: Denies shortness of breath at rest or with exertion. Denies wheezing or cough.  GI: see HPI  GU : Denies urinary burning,  urinary frequency, urinary hesitancy MS: Denies joint pain, muscle weakness, cramps, or limitation of movement.  Derm: Denies rash, itching, dry skin Psych: Denies depression, anxiety, memory loss, and confusion Heme: Denies bruising, bleeding, and enlarged lymph nodes.  Physical Exam: BP 136/76   Pulse 71   Temp (!) 97.4 F (36.3 C) (Oral)   Ht 5\' 7"  (1.702 m)   Wt (!) 313 lb (142 kg)   BMI 49.02 kg/m  General:   Alert and oriented. Pleasant and cooperative. Well-nourished and well-developed.  Head:  Normocephalic and atraumatic. Eyes:  Without icterus, sclera clear and conjunctiva pink.  Ears:  Normal auditory acuity. Nose:  No deformity, discharge,  or lesions. Mouth:  No deformity or lesions, oral mucosa pink.  Lungs:  Clear to auscultation bilaterally. No wheezes, rales, or rhonchi. No distress.  Heart:  S1, S2 present without murmurs appreciated.  Abdomen:  +BS, soft, non-tender and non-distended. No HSM noted. No guarding or rebound. No masses appreciated.  Rectal:  Deferred  Msk:  Symmetrical without gross deformities. Normal posture. Pulses:  Normal pulses noted. Extremities:  Without clubbing or edema. Neurologic:  Alert and  oriented x4;  grossly normal neurologically. Psych:  Alert and cooperative. Normal mood and affect.

## 2017-10-08 NOTE — Assessment & Plan Note (Signed)
52 year old female with 6 month history of profuse, watery diarrhea. Discontinued colchicine on her own without any improvement. Nocturnal incontinence concerning. Last colonoscopy in May 2013 at [facility]. No other concerning features such as weight loss, rectal bleeding, abdominal pain. Cdiff antigen positive in Sept 2018 while inpatient but negative PCR. Most consistent with carrier; however, I would like to obtain another sample in case the first was not an adequate sample. She has several risk factors for CDI, so we need to rule this out. Additional stool cultures ordered as well. Bentyl for supportive measures sent to pharmacy in interim. May ultimately need colonoscopy with random colonic biopsies if no improvement. Further recommendations to follow.

## 2017-10-23 ENCOUNTER — Emergency Department (HOSPITAL_COMMUNITY)
Admission: EM | Admit: 2017-10-23 | Discharge: 2017-10-23 | Disposition: A | Discharge status: Home / Self Care | Payer: Medicaid Other | Attending: Emergency Medicine | Admitting: Emergency Medicine

## 2017-10-23 ENCOUNTER — Encounter (HOSPITAL_COMMUNITY): Payer: Self-pay

## 2017-10-23 DIAGNOSIS — I120 Hypertensive chronic kidney disease with stage 5 chronic kidney disease or end stage renal disease: Secondary | ICD-10-CM | POA: Diagnosis not present

## 2017-10-23 DIAGNOSIS — E1122 Type 2 diabetes mellitus with diabetic chronic kidney disease: Secondary | ICD-10-CM | POA: Diagnosis not present

## 2017-10-23 DIAGNOSIS — Z87891 Personal history of nicotine dependence: Secondary | ICD-10-CM | POA: Diagnosis not present

## 2017-10-23 DIAGNOSIS — J449 Chronic obstructive pulmonary disease, unspecified: Secondary | ICD-10-CM | POA: Insufficient documentation

## 2017-10-23 DIAGNOSIS — H5712 Ocular pain, left eye: Secondary | ICD-10-CM | POA: Diagnosis present

## 2017-10-23 DIAGNOSIS — H1032 Unspecified acute conjunctivitis, left eye: Secondary | ICD-10-CM | POA: Diagnosis not present

## 2017-10-23 DIAGNOSIS — Z79899 Other long term (current) drug therapy: Secondary | ICD-10-CM | POA: Insufficient documentation

## 2017-10-23 DIAGNOSIS — N185 Chronic kidney disease, stage 5: Secondary | ICD-10-CM | POA: Diagnosis not present

## 2017-10-23 MED ORDER — ERYTHROMYCIN 5 MG/GM OP OINT
TOPICAL_OINTMENT | Freq: Once | OPHTHALMIC | Status: AC
  Administered 2017-10-23: 1 via OPHTHALMIC
  Filled 2017-10-23: qty 3.5

## 2017-10-23 MED ORDER — FLUORESCEIN SODIUM 1 MG OP STRP
1.0000 | ORAL_STRIP | Freq: Once | OPHTHALMIC | Status: AC
  Administered 2017-10-23: 1 via OPHTHALMIC

## 2017-10-23 MED ORDER — TETRACAINE HCL 0.5 % OP SOLN
2.0000 [drp] | Freq: Once | OPHTHALMIC | Status: AC
  Administered 2017-10-23: 2 [drp] via OPHTHALMIC
  Filled 2017-10-23: qty 4

## 2017-10-23 NOTE — ED Provider Notes (Signed)
[facility] EMERGENCY DEPARTMENT Provider Note   CSN: [id number] Arrival date & time: 10/23/17  1537     History   Chief Complaint Chief Complaint  Patient presents with  . Eye Pain    HPI Martha [name] is a 52 y.o. female with past medical history as outlined below, including left eye diabetic retinopathy with current blood in her left globe causing complete blindness (reports has had 3 surgeries so far with no improvement)  presenting with burning pain in her left eye which started approx 9 am today while undergoing dialysis.  She reports she is very sensitive to clorox and this was being used to clean equipment at dialysis this am.  To her knowledge she had nothing splash in her eye and she denies a foreign body sensation or drainage except for clear tears. Her eye has become more red since this am.  She has found no alleviators and denies any symptoms in her right eye.  HPI  Past Medical History:  Diagnosis Date  . Acid reflux    takes Tums  . Anemia   . Arthritis   . Bipolar 1 disorder ([place])   . Cervical radiculopathy   . CHF (congestive heart failure) ([place])   . Chronic kidney disease    Stage 5- 01/25/17  . COPD (chronic obstructive pulmonary disease) ([place]) 2014   bronchitis  . Degenerative disc disease, thoracic   . Depression   . Diabetes mellitus    Type II  . Hypertension   . Noncompliance with medication regimen   . Obesity (BMI 30-39.9)   . OSA (obstructive sleep apnea)    cpap  . Panic attack   . RLS (restless legs syndrome)     Patient Active Problem List   Diagnosis Date Noted  . Chronic diarrhea 10/08/2017  . Renal failure (ARF), acute on chronic (HCC) 04/22/2017  . Anasarca associated with disorder of kidney 04/20/2017  . Metabolic encephalopathy 60/73/7106  . Acute pulmonary edema ([place]) 04/05/2017  . Acute psychosis ([place]) 12/01/2016  . Protein-calorie malnutrition, severe 11/29/2016  . Confusion 11/22/2016  . HTN (hypertension), malignant  11/20/2016  . Diabetes mellitus 11/20/2016  . Bipolar 1 disorder ([place]) 11/20/2016  . Noncompliance with medication regimen 11/20/2016  . Panic attack 11/20/2016  . CKD stage 5 secondary to hypertension ([place]) 11/15/2016  . AKI (acute kidney injury) ([place]) 11/15/2016  . Chest pain 10/27/2016  . CKD (chronic kidney disease) stage 5, GFR less than 15 ml/min (HCC) 10/27/2016  . Uncontrolled type 2 diabetes mellitus with hyperglycemia, without long-term current use of insulin ([place]) 10/27/2016  . Sensory disturbance 10/27/2016  . Left hemiparesis ([place]) 10/27/2016  . Acute on chronic renal failure ([place]) 05/25/2016  . Abdominal pain 04/26/2014  . Cholelithiasis 04/26/2014  . ARF (acute renal failure) ([place]) 04/26/2014  . Dehydration 04/26/2014  . Hyponatremia 04/26/2014  . Gallstones 04/06/2014  . Uncontrolled diabetes mellitus ([place]) 10/09/2013  . Breast abscess 05/30/2013  . Diabetes mellitus, type 2 ([place]) 05/30/2013  . Hyperglycemia 05/30/2013  . Internal hemorrhoids with other complication 26/94/8546  . Umbilical hernia without mention of obstruction or gangrene 04/15/2013  . Anemia 04/15/2013  . DM 05/13/2010  . Hyperlipidemia 05/13/2010  . Morbid obesity ([place]) 05/13/2010  . Depression 05/13/2010  . RESTLESS LEG SYNDROME 05/13/2010  . Essential hypertension 05/13/2010  . GERD 05/13/2010  . RECTAL BLEEDING 05/13/2010  . Sleep apnea 05/13/2010  . Dysphagia, oropharyngeal phase 05/13/2010    Past Surgical History:  Procedure Laterality Date  .  AV FISTULA PLACEMENT Right 01/27/2017   Procedure: ARTERIOVENOUS (AV) FISTULA CREATION-RIGHT ARM;  Surgeon: Elam Dutch, MD;  Location: [place];  Service: Vascular;  Laterality: Right;  . BIOPSY N/A 05/17/2013   Procedure: BIOPSY;  Surgeon: [name], MD;  Location: [facility];  Service: Endoscopy;  Laterality: N/A;  . CHOLECYSTECTOMY N/A 04/27/2014   Procedure: LAPAROSCOPIC CHOLECYSTECTOMY;  Surgeon: [name], MD;  [facility]: [facility];  Service: General;  Laterality: N/A;  . COLONOSCOPY  06/06/2010   VZC:HYIFOY bleeding secondary to internal hemorrhoids but incomplete evaluation secondary to poor right colon prep/small rectal and sigmoid colon polyps (hyperplastic). PROPOFOL  . COLONOSCOPY  May 2013   Dr. Gilliam/[facility]: 5 mm a descending colon polyp, hyperplastic. Adequate bowel prep.  . ENDOMETRIAL ABLATION    . ESOPHAGOGASTRODUODENOSCOPY  06/06/2010   DXA:JOINOMV erythema and edema of body of stomach, with sessile polypoid lesions. bx benign. no h.pylori  . ESOPHAGOGASTRODUODENOSCOPY (EGD) WITH PROPOFOL N/A 05/17/2013   Dr. Oneida Alar: normal esophagus, moderate nodular gastritis, negative path, empiric Savary dilation  . EYE SURGERY  07/2017   sx for glaucoma  . FLEXIBLE SIGMOIDOSCOPY N/A 05/17/2013   Dr. Oneida Alar: moderate sized internal hemorrhoids  . HEMORRHOID BANDING N/A 05/17/2013   Procedure: HEMORRHOID BANDING;  Surgeon: [name], MD;  Location: [facility];  Service: Endoscopy;  Laterality: N/A;  2 bands placed  . INCISION AND DRAINAGE ABSCESS Left 10/11/2013   Procedure: INCISION AND DRAINAGE AND DEBRIDEMENT LEFT BREAST  ABSCESS;  Surgeon: [name], MD;  Location: [facility];  Service: General;  Laterality: Left;  . IRRIGATION AND DEBRIDEMENT ABSCESS Right 06/01/2013   Procedure: INCISION AND DRAINAGE AND DEBRIDEMENT ABSCESS RIGHT BREAST;  Surgeon: [name], MD;  Location: [facility];  Service: General;  Laterality: Right;  . SAVORY DILATION N/A 05/17/2013   Procedure: SAVORY DILATION;  Surgeon: [name], MD;  Location: [facility];  Service: Endoscopy;  Laterality: N/A;  14/15/16  . TUBAL LIGATION       OB History    Gravida  2   Para  2   Term  1   Preterm  1   AB      Living  2     SAB      TAB      Ectopic      Multiple      Live Births               Home Medications    Prior to Admission medications   Medication Sig Start Date End Date Taking? Authorizing  Provider  brimonidine-timolol (COMBIGAN) 0.2-0.5 % ophthalmic solution Place 1 drop into both eyes every 12 (twelve) hours.   Yes [provider]  albuterol (PROVENTIL HFA;VENTOLIN HFA) 108 (90 Base) MCG/ACT inhaler Inhale 2 puffs into the lungs every 4 (four) hours as needed for wheezing or shortness of breath. 07/23/16   Francine Graven, DO  ALPRAZolam Duanne Moron) 1 MG tablet Take 1 mg by mouth 4 (four) times daily as needed for anxiety (nerves).    [provider]  atorvastatin (LIPITOR) 10 MG tablet Take 10 mg by mouth daily. Take along with 20 mg tablet to equal 30 mg daily.    [provider]  atorvastatin (LIPITOR) 20 MG tablet Take 20 mg by mouth daily. With Atorvastatin 10 mg to equal 30 mg total daily    [provider]  calcium carbonate (TUMS EX) 750 MG chewable tablet Chew 1-2 tablets by mouth  6 (six) times daily. Patient takes 3 tablets with meals and 2 tablet with snacks three times a day    [provider]  carvedilol (COREG) 12.5 MG tablet Take 1 tablet (12.5 mg total) by mouth 2 (two) times daily with a meal. 05/26/16   Cherene Altes, MD  colchicine 0.6 MG tablet Take 1 tablet (0.6 mg total) by mouth every other day. 11/19/16   Sinda Du, MD  dicyclomine (BENTYL) 10 MG capsule Take 1 capsule (10 mg total) by mouth 4 (four) times daily -  before meals and at bedtime. 10/08/17   Annitta Needs, NP  fluticasone (FLONASE) 50 MCG/ACT nasal spray Place 2 sprays into both nostrils at bedtime.    [provider]  hydrALAZINE (APRESOLINE) 25 MG tablet Take 1 tablet (25 mg total) by mouth every 8 (eight) hours. Patient taking differently: Take 25 mg by mouth 2 (two) times daily. Doesn't take am dose Mon, Wed, Fri 10/29/16   Sinda Du, MD  HYDROcodone-acetaminophen [facility]) 10-325 MG tablet Take 1 tablet by mouth every 6 (six) hours as needed for moderate pain. 01/27/17   Rhyne, Hulen Shouts, PA-C  lidocaine-prilocaine (EMLA) cream Apply  1 application topically as needed (topical anesthesia for hemodialysis if Gebauers and Lidocaine injection are ineffective.). 04/25/17   Johnson, Clanford L, MD  loperamide (IMODIUM) 2 MG capsule Take 1 capsule (2 mg total) by mouth 4 (four) times daily as needed for diarrhea or loose stools. 10/23/16   Evalee Jefferson, PA-C  PARoxetine (PAXIL) 20 MG tablet Take 1 daily. This is to prevent panic attacks Patient taking differently: Take 20-40 mg by mouth daily. Take 1 daily. This is to prevent panic attacks 10/28/16   Sinda Du, MD  risperiDONE (RISPERDAL) 0.5 MG tablet Take 1 tablet (0.5 mg total) by mouth 2 (two) times daily. 12/01/16   Sinda Du, MD  rOPINIRole (REQUIP) 1 MG tablet Take 1 mg by mouth at bedtime.     [provider]    Family History Family History  Adopted: Yes  Family history unknown: Yes    Social History Social History   Tobacco Use  . Smoking status: Former Smoker    Packs/day: 1.50    Years: 25.00    Pack years: 37.50    Types: Cigarettes    Last attempt to quit: 10/23/2007    Years since quitting: 10.0  . Smokeless tobacco: Never Used  . Tobacco comment: Quit x 10 years ago  Substance Use Topics  . Alcohol use: No  . Drug use: No     Allergies   Codeine and Tape   Review of Systems Review of Systems  Constitutional: Negative for fever.  HENT: Negative for congestion and sore throat.   Eyes: Positive for pain, redness and visual disturbance. Negative for discharge.  Respiratory: Negative for chest tightness and shortness of breath.   Cardiovascular: Negative for chest pain.  Gastrointestinal: Negative for abdominal pain and nausea.  Genitourinary: Negative.   Musculoskeletal: Negative for arthralgias, joint swelling and neck pain.  Skin: Negative.  Negative for rash and wound.  Neurological: Negative for dizziness, weakness, light-headedness, numbness and headaches.  Psychiatric/Behavioral: Negative.      Physical Exam Updated  Vital Signs BP (!) 144/96 (BP Location: Left Arm)   Pulse 72   Temp 97.9 F (36.6 C) (Oral)   Resp 18   Wt 134.7 kg (297 lb)   SpO2 92%   BMI 46.52 kg/m   Physical Exam  Constitutional: She  appears well-developed and well-nourished.  HENT:  Head: Normocephalic and atraumatic.  Eyes: Left eye exhibits no chemosis, no discharge and no exudate. Left conjunctiva is injected. Left eye exhibits abnormal extraocular motion. Left pupil is not reactive.  Slit lamp exam:      The left eye shows hyphema. The left eye shows no corneal abrasion and no fluorescein uptake.  PH 7.0  Pressure measurement x 2 -17mmHg and 49mmHg.  Visual acuity not performed. Pt reports complete blindness left eye, unchanged. Large hyphema noted.  Neck: Normal range of motion.  Cardiovascular: Normal rate.  Pulmonary/Chest: Effort normal.  Musculoskeletal: Normal range of motion.  Neurological: She is alert.  Skin: Skin is warm and dry.  Psychiatric: She has a normal mood and affect.  Nursing note and vitals reviewed.    ED Treatments / Results  Labs (all labs ordered are listed, but only abnormal results are displayed) Labs Reviewed - No data to display  EKG None  Radiology No results found.  Procedures Procedures (including critical care time)  Medications Ordered in ED Medications  erythromycin ophthalmic ointment (has no administration in time range)  fluorescein ophthalmic strip 1 strip (1 strip Both Eyes Given 10/23/17 1658)  tetracaine (PONTOCAINE) 0.5 % ophthalmic solution 2 drop (2 drops Both Eyes Given 10/23/17 1658)     Initial Impression / Assessment and Plan / ED Course  I have reviewed the triage vital signs and the nursing notes.  Pertinent labs & imaging results that were available during my care of the patient were reviewed by me and considered in my medical decision making (see chart for details).     Pt was given tetracaine drops with complete resolution of eye pain,  signifying surface irritation/conjunctivitis.  No corneal abrasion or irritation. Large chronic hyphema.  Husband at bedside and wife confirm this is stable.  Has appt with her ophthalmologist in 5 days (Dr. Noel Journey).  Erythromycin ointment given for q 6 hour instillation if burning returns.  Conjunctivitis appears irritant, not bacterial or viral, but the ointment should give comfort prn.  Final Clinical Impressions(s) / ED Diagnoses   Final diagnoses:  Acute conjunctivitis of left eye, unspecified acute conjunctivitis type    ED Discharge Orders    None       Landis Martins 10/23/17 1718    Long, Wonda Olds, MD 10/24/17 2049

## 2017-10-23 NOTE — ED Notes (Signed)
Flushed left eye with 50 cc of NS.

## 2017-10-23 NOTE — Discharge Instructions (Addendum)
Continue using your home medications including your eye drop Combigan as prescribed by Dr. Noel Journey.  You may apply a small ribbon of the antibiotic ointment in your left eye every 6 hours if the burning or irritation returns.  Plan to see Dr. Noel Journey this week as planned.

## 2017-10-23 NOTE — ED Triage Notes (Signed)
Pt reports she was at dialysis this morning and left eye started burning. Eye noted to be red . No drainage

## 2017-10-23 NOTE — ED Notes (Signed)
Pt refuses visual acuity test.  Says she is blind in her left eye.  Martha Jefferson, PA informed.

## 2017-10-30 ENCOUNTER — Ambulatory Visit: Payer: Medicaid Other | Admitting: Gastroenterology

## 2017-11-06 ENCOUNTER — Ambulatory Visit: Payer: Medicaid Other | Admitting: Gastroenterology

## 2017-12-11 ENCOUNTER — Encounter (HOSPITAL_COMMUNITY): Payer: Self-pay | Admitting: Emergency Medicine

## 2017-12-11 ENCOUNTER — Emergency Department (HOSPITAL_COMMUNITY)
Admission: EM | Admit: 2017-12-11 | Discharge: 2017-12-11 | Disposition: A | Discharge status: Home / Self Care | Payer: Medicaid Other | Attending: Emergency Medicine | Admitting: Emergency Medicine

## 2017-12-11 ENCOUNTER — Other Ambulatory Visit: Payer: Self-pay

## 2017-12-11 DIAGNOSIS — H5712 Ocular pain, left eye: Secondary | ICD-10-CM | POA: Insufficient documentation

## 2017-12-11 DIAGNOSIS — E1122 Type 2 diabetes mellitus with diabetic chronic kidney disease: Secondary | ICD-10-CM | POA: Insufficient documentation

## 2017-12-11 DIAGNOSIS — Z87891 Personal history of nicotine dependence: Secondary | ICD-10-CM | POA: Insufficient documentation

## 2017-12-11 DIAGNOSIS — L299 Pruritus, unspecified: Secondary | ICD-10-CM | POA: Diagnosis not present

## 2017-12-11 DIAGNOSIS — Z794 Long term (current) use of insulin: Secondary | ICD-10-CM | POA: Insufficient documentation

## 2017-12-11 DIAGNOSIS — I509 Heart failure, unspecified: Secondary | ICD-10-CM | POA: Diagnosis not present

## 2017-12-11 DIAGNOSIS — Z992 Dependence on renal dialysis: Secondary | ICD-10-CM | POA: Insufficient documentation

## 2017-12-11 DIAGNOSIS — H5462 Unqualified visual loss, left eye, normal vision right eye: Secondary | ICD-10-CM | POA: Diagnosis not present

## 2017-12-11 DIAGNOSIS — I132 Hypertensive heart and chronic kidney disease with heart failure and with stage 5 chronic kidney disease, or end stage renal disease: Secondary | ICD-10-CM | POA: Diagnosis not present

## 2017-12-11 DIAGNOSIS — E11319 Type 2 diabetes mellitus with unspecified diabetic retinopathy without macular edema: Secondary | ICD-10-CM | POA: Insufficient documentation

## 2017-12-11 DIAGNOSIS — N186 End stage renal disease: Secondary | ICD-10-CM | POA: Insufficient documentation

## 2017-12-11 DIAGNOSIS — J449 Chronic obstructive pulmonary disease, unspecified: Secondary | ICD-10-CM | POA: Insufficient documentation

## 2017-12-11 DIAGNOSIS — Z79899 Other long term (current) drug therapy: Secondary | ICD-10-CM | POA: Diagnosis not present

## 2017-12-11 HISTORY — DX: Type 2 diabetes mellitus with unspecified diabetic retinopathy without macular edema: E11.319

## 2017-12-11 HISTORY — DX: Blindness, one eye, unspecified eye: H54.40

## 2017-12-11 MED ORDER — DIPHENHYDRAMINE HCL 25 MG PO CAPS
25.0000 mg | ORAL_CAPSULE | Freq: Once | ORAL | Status: AC
  Administered 2017-12-11: 25 mg via ORAL
  Filled 2017-12-11: qty 1

## 2017-12-11 MED ORDER — TETRACAINE HCL 0.5 % OP SOLN
OPHTHALMIC | Status: AC
  Filled 2017-12-11: qty 4

## 2017-12-11 MED ORDER — FLUORESCEIN SODIUM 1 MG OP STRP
ORAL_STRIP | OPHTHALMIC | Status: AC
  Filled 2017-12-11: qty 1

## 2017-12-11 MED ORDER — OXYCODONE-ACETAMINOPHEN 5-325 MG PO TABS
1.0000 | ORAL_TABLET | Freq: Once | ORAL | Status: AC
  Administered 2017-12-11: 1 via ORAL

## 2017-12-11 MED ORDER — MORPHINE SULFATE (PF) 4 MG/ML IV SOLN
6.0000 mg | Freq: Once | INTRAVENOUS | Status: AC
  Administered 2017-12-11: 6 mg via INTRAMUSCULAR
  Filled 2017-12-11: qty 2

## 2017-12-11 NOTE — Discharge Instructions (Addendum)
As discussed, Dr. Manuella Ghazi will see you in his office now.  Go directly there after ER discharge.

## 2017-12-11 NOTE — ED Provider Notes (Signed)
[facility] EMERGENCY DEPARTMENT Provider Note   CSN: [id number] Arrival date & time: 12/11/17  9767     History   Chief Complaint Chief Complaint  Patient presents with  . Eye Pain    HPI Martha [name] is a 52 y.o. female.  HPI  Martha [name] is a 52 y.o. female with hx of diabetic retinopathy and blindness with multiple surgeries to left eye, presents to the Emergency Department complaining of increasing pain to the left eye.  Symptoms present for several days.  Went to dialysis, but unable to complete treatment due to eye pain.  Also complains of increased redness to the eye, can only see small amount of bright light which is baseline for her since her surgeries. Has appt with her ophthalmologist next week.  Denies trauma, dizziness, contact use, and fever   Past Medical History:  Diagnosis Date  . Acid reflux    takes Tums  . Anemia   . Arthritis   . Bipolar 1 disorder ([place])   . Blindness of left eye   . Cervical radiculopathy   . CHF (congestive heart failure) ([place])   . Chronic kidney disease    Stage 5- 01/25/17  . COPD (chronic obstructive pulmonary disease) ([place]) 2014   bronchitis  . Degenerative disc disease, thoracic   . Depression   . Diabetes mellitus    Type II  . Diabetic retinopathy ([place])   . Hypertension   . Noncompliance with medication regimen   . Obesity (BMI 30-39.9)   . OSA (obstructive sleep apnea)    cpap  . Panic attack   . RLS (restless legs syndrome)     Patient Active Problem List   Diagnosis Date Noted  . Chronic diarrhea 10/08/2017  . Renal failure (ARF), acute on chronic (HCC) 04/22/2017  . Anasarca associated with disorder of kidney 04/20/2017  . Metabolic encephalopathy 34/19/3790  . Acute pulmonary edema ([place]) 04/05/2017  . Acute psychosis ([place]) 12/01/2016  . Protein-calorie malnutrition, severe 11/29/2016  . Confusion 11/22/2016  . HTN (hypertension), malignant 11/20/2016  . Diabetes mellitus 11/20/2016  . Bipolar 1  disorder ([place]) 11/20/2016  . Noncompliance with medication regimen 11/20/2016  . Panic attack 11/20/2016  . CKD stage 5 secondary to hypertension ([place]) 11/15/2016  . AKI (acute kidney injury) ([place]) 11/15/2016  . Chest pain 10/27/2016  . CKD (chronic kidney disease) stage 5, GFR less than 15 ml/min (HCC) 10/27/2016  . Uncontrolled type 2 diabetes mellitus with hyperglycemia, without long-term current use of insulin ([place]) 10/27/2016  . Sensory disturbance 10/27/2016  . Left hemiparesis ([place]) 10/27/2016  . Acute on chronic renal failure ([place]) 05/25/2016  . Abdominal pain 04/26/2014  . Cholelithiasis 04/26/2014  . ARF (acute renal failure) ([place]) 04/26/2014  . Dehydration 04/26/2014  . Hyponatremia 04/26/2014  . Gallstones 04/06/2014  . Uncontrolled diabetes mellitus ([employer]) 10/09/2013  . Breast abscess 05/30/2013  . Diabetes mellitus, type 2 ([place]) 05/30/2013  . Hyperglycemia 05/30/2013  . Internal hemorrhoids with other complication 24/03/7352  . Umbilical hernia without mention of obstruction or gangrene 04/15/2013  . Anemia 04/15/2013  . DM 05/13/2010  . Hyperlipidemia 05/13/2010  . Morbid obesity ([place]) 05/13/2010  . Depression 05/13/2010  . RESTLESS LEG SYNDROME 05/13/2010  . Essential hypertension 05/13/2010  . GERD 05/13/2010  . RECTAL BLEEDING 05/13/2010  . Sleep apnea 05/13/2010  . Dysphagia, oropharyngeal phase 05/13/2010    Past Surgical History:  Procedure Laterality Date  . AV FISTULA PLACEMENT Right 01/27/2017   Procedure: ARTERIOVENOUS (  AV) FISTULA CREATION-RIGHT ARM;  Surgeon: Elam Dutch, MD;  Location: [facility] OR;  Service: Vascular;  Laterality: Right;  . BIOPSY N/A 05/17/2013   Procedure: BIOPSY;  Surgeon: [name], MD;  Location: [facility] ORS;  Service: Endoscopy;  Laterality: N/A;  . CHOLECYSTECTOMY N/A 04/27/2014   Procedure: LAPAROSCOPIC CHOLECYSTECTOMY;  Surgeon: [name], MD;  Location: [facility] ORS;  Service: General;  Laterality: N/A;  . COLONOSCOPY   06/06/2010   TDS:KAJGOT bleeding secondary to internal hemorrhoids but incomplete evaluation secondary to poor right colon prep/small rectal and sigmoid colon polyps (hyperplastic). PROPOFOL  . COLONOSCOPY  May 2013   Dr. Gilliam/[facility]: 5 mm a descending colon polyp, hyperplastic. Adequate bowel prep.  . ENDOMETRIAL ABLATION    . ESOPHAGOGASTRODUODENOSCOPY  06/06/2010   LXB:WIOMBTD erythema and edema of body of stomach, with sessile polypoid lesions. bx benign. no h.pylori  . ESOPHAGOGASTRODUODENOSCOPY (EGD) WITH PROPOFOL N/A 05/17/2013   Dr. Oneida Alar: normal esophagus, moderate nodular gastritis, negative path, empiric Savary dilation  . EYE SURGERY  07/2017   sx for glaucoma  . FLEXIBLE SIGMOIDOSCOPY N/A 05/17/2013   Dr. Oneida Alar: moderate sized internal hemorrhoids  . HEMORRHOID BANDING N/A 05/17/2013   Procedure: HEMORRHOID BANDING;  Surgeon: [name], MD;  Location: [facility] ORS;  Service: Endoscopy;  Laterality: N/A;  2 bands placed  . INCISION AND DRAINAGE ABSCESS Left 10/11/2013   Procedure: INCISION AND DRAINAGE AND DEBRIDEMENT LEFT BREAST  ABSCESS;  Surgeon: [name], MD;  Location: [facility] ORS;  Service: General;  Laterality: Left;  . IRRIGATION AND DEBRIDEMENT ABSCESS Right 06/01/2013   Procedure: INCISION AND DRAINAGE AND DEBRIDEMENT ABSCESS RIGHT BREAST;  Surgeon: [name], MD;  Location: [facility] ORS;  Service: General;  Laterality: Right;  . SAVORY DILATION N/A 05/17/2013   Procedure: SAVORY DILATION;  Surgeon: [name], MD;  Location: [facility] ORS;  Service: Endoscopy;  Laterality: N/A;  14/15/16  . TUBAL LIGATION       OB History    Gravida  2   Para  2   Term  1   Preterm  1   AB      Living  2     SAB      TAB      Ectopic      Multiple      Live Births               Home Medications    Prior to Admission medications   Medication Sig Start Date End Date Taking? Authorizing Provider  ALPRAZolam Duanne Moron) 1 MG tablet Take 1 mg by mouth 4  (four) times daily as needed for anxiety (nerves).   Yes [provider]  atorvastatin (LIPITOR) 10 MG tablet Take 10 mg by mouth daily. Take along with 20 mg tablet to equal 30 mg daily.   Yes [provider]  calcium carbonate (TUMS EX) 750 MG chewable tablet Chew 1-2 tablets by mouth 6 (six) times daily. Patient takes 3 tablets with meals and 2 tablet with snacks three times a day   Yes [provider]  carvedilol (COREG) 12.5 MG tablet Take 1 tablet (12.5 mg total) by mouth 2 (two) times daily with a meal. 05/26/16  Yes Cherene Altes, MD  colchicine 0.6 MG tablet Take 1 tablet (0.6 mg total) by mouth every other day. 11/19/16  Yes Sinda Du, MD  dicyclomine (BENTYL) 10 MG capsule Take 1 capsule (10 mg total) by mouth 4 (four) times daily -  before meals and at bedtime. 10/08/17  Yes Annitta Needs, NP  hydrALAZINE (APRESOLINE) 25 MG tablet Take 1 tablet (25 mg total) by mouth every 8 (eight) hours. Patient taking differently: Take 25 mg by mouth 2 (two) times daily. Doesn't take am dose Mon, Wed, Fri 10/29/16  Yes Sinda Du, MD  HYDROcodone-acetaminophen [facility]) 10-325 MG tablet Take 1 tablet by mouth every 6 (six) hours as needed for moderate pain. 01/27/17  Yes Rhyne, Samantha J, PA-C  insulin aspart (NOVOLOG) 100 UNIT/ML injection Inject 20-25 Units into the skin daily. 20-25 units per sliding scale 03/02/13  Yes [provider]  lidocaine-prilocaine (EMLA) cream Apply 1 application topically as needed (topical anesthesia for hemodialysis if Gebauers and Lidocaine injection are ineffective.). 04/25/17  Yes Johnson, Clanford L, MD  PARoxetine (PAXIL) 20 MG tablet Take 1 daily. This is to prevent panic attacks Patient taking differently: Take 20-40 mg by mouth daily. Take 1 daily. This is to prevent panic attacks 10/28/16  Yes Sinda Du, MD  risperiDONE (RISPERDAL) 0.5 MG tablet Take 1 tablet (0.5 mg total) by mouth 2 (two) times daily. 12/01/16  Yes  Sinda Du, MD  rOPINIRole (REQUIP) 1 MG tablet Take 1 mg by mouth at bedtime.    Yes [provider]  loperamide (IMODIUM) 2 MG capsule Take 1 capsule (2 mg total) by mouth 4 (four) times daily as needed for diarrhea or loose stools. 10/23/16   Evalee Jefferson, PA-C    Family History Family History  Adopted: Yes  Family history unknown: Yes    Social History Social History   Tobacco Use  . Smoking status: Former Smoker    Packs/day: 1.50    Years: 25.00    Pack years: 37.50    Types: Cigarettes    Last attempt to quit: 10/23/2007    Years since quitting: 10.1  . Smokeless tobacco: Never Used  . Tobacco comment: Quit x 10 years ago  Substance Use Topics  . Alcohol use: No  . Drug use: No     Allergies   Codeine and Tape   Review of Systems Review of Systems  Constitutional: Negative for appetite change and fever.  HENT: Negative for congestion.   Eyes: Positive for pain and redness. Negative for discharge.  Respiratory: Negative for chest tightness and shortness of breath.   Cardiovascular: Negative for chest pain.  Gastrointestinal: Negative for vomiting.  Musculoskeletal: Negative for neck pain and neck stiffness.  Skin: Negative for rash.  Neurological: Negative for dizziness, facial asymmetry, speech difficulty, weakness and numbness.     Physical Exam Updated Vital Signs BP (!) 186/89 (BP Location: Left Arm)   Pulse 68   Temp 98.2 F (36.8 C) (Oral)   Resp 18   Ht 5\' 7"  (1.702 m)   Wt 136.1 kg (300 lb)   SpO2 93%   BMI 46.99 kg/m   Physical Exam  Constitutional: She is oriented to person, place, and time. She appears well-developed and well-nourished. No distress.  HENT:  Head: Atraumatic.  Eyes: Lids are everted and swept, no foreign bodies found. Left eye exhibits no chemosis. No foreign body present in the left eye. Left conjunctiva is injected. Right eye exhibits normal extraocular motion. Left eye exhibits normal extraocular motion.  Left pupil is reactive.  Slit lamp exam:      The left eye shows no corneal ulcer, no foreign body and no fluorescein uptake.  Diffuse erythema of left sclera.  Optic disk appears dull.  Left pupil  is non-reactive.  Pt able to visualize light only which is baseline.  Neck: Normal range of motion.  Cardiovascular: Normal rate and regular rhythm.  Pulmonary/Chest: Effort normal and breath sounds normal. No respiratory distress.  Lymphadenopathy:    She has no cervical adenopathy.  Neurological: She is alert and oriented to person, place, and time.  Skin: Skin is warm. No rash noted.  Psychiatric: She has a normal mood and affect.  Nursing note and vitals reviewed.    ED Treatments / Results  Labs (all labs ordered are listed, but only abnormal results are displayed) Labs Reviewed - No data to display  EKG None  Radiology No results found.  Procedures Procedures (including critical care time)  Medications Ordered in ED Medications  fluorescein 1 MG ophthalmic strip (has no administration in time range)  tetracaine (PONTOCAINE) 0.5 % ophthalmic solution (has no administration in time range)  morphine 4 MG/ML injection 6 mg (6 mg Intramuscular Given 12/11/17 1013)  diphenhydrAMINE (BENADRYL) capsule 25 mg (25 mg Oral Given 12/11/17 1039)     Initial Impression / Assessment and Plan / ED Course  I have reviewed the triage vital signs and the nursing notes.  Pertinent labs & imaging results that were available during my care of the patient were reviewed by me and considered in my medical decision making (see chart for details).     Left IOP measured with tonopen x 2 :  20 mmHg and 21 mmHg  1150  Consulted findings with ophthalmology, Dr. Manuella Ghazi, who advised to have pt come to the office now for evaluation.  Discussed with the patient and she agrees to plan.    Final Clinical Impressions(s) / ED Diagnoses   Final diagnoses:  Left eye pain    ED Discharge Orders    None        Kem Parkinson, PA-C 12/12/17 2123    Francine Graven, DO 12/13/17 2135

## 2017-12-11 NOTE — ED Triage Notes (Signed)
Pt c/o severe pain in left eye and sclera red.  Pt says has had several surgeries to left eye and can only see light when someone points a pen light in eye.

## 2017-12-11 NOTE — ED Notes (Signed)
Pt c/o itching to LT shoulder where morphine injection was given. No redness or rash noted. EDP notified and is at bedside.

## 2017-12-11 NOTE — ED Notes (Signed)
EDP at bedside  

## 2017-12-13 ENCOUNTER — Other Ambulatory Visit: Payer: Self-pay

## 2017-12-13 ENCOUNTER — Emergency Department (HOSPITAL_COMMUNITY): Payer: Medicaid Other

## 2017-12-13 ENCOUNTER — Encounter (HOSPITAL_COMMUNITY): Payer: Self-pay | Admitting: Emergency Medicine

## 2017-12-13 ENCOUNTER — Observation Stay (HOSPITAL_COMMUNITY)
Admission: EM | Admit: 2017-12-13 | Discharge: 2017-12-15 | Disposition: A | Discharge status: Home / Self Care | Payer: Medicaid Other | Attending: Pulmonary Disease | Admitting: Pulmonary Disease

## 2017-12-13 DIAGNOSIS — G8929 Other chronic pain: Secondary | ICD-10-CM | POA: Insufficient documentation

## 2017-12-13 DIAGNOSIS — E119 Type 2 diabetes mellitus without complications: Secondary | ICD-10-CM

## 2017-12-13 DIAGNOSIS — Z9119 Patient's noncompliance with other medical treatment and regimen: Secondary | ICD-10-CM | POA: Diagnosis not present

## 2017-12-13 DIAGNOSIS — K529 Noninfective gastroenteritis and colitis, unspecified: Secondary | ICD-10-CM | POA: Diagnosis not present

## 2017-12-13 DIAGNOSIS — G9341 Metabolic encephalopathy: Secondary | ICD-10-CM | POA: Insufficient documentation

## 2017-12-13 DIAGNOSIS — F39 Unspecified mood [affective] disorder: Secondary | ICD-10-CM | POA: Insufficient documentation

## 2017-12-13 DIAGNOSIS — J449 Chronic obstructive pulmonary disease, unspecified: Secondary | ICD-10-CM | POA: Insufficient documentation

## 2017-12-13 DIAGNOSIS — N186 End stage renal disease: Secondary | ICD-10-CM | POA: Insufficient documentation

## 2017-12-13 DIAGNOSIS — G4730 Sleep apnea, unspecified: Secondary | ICD-10-CM | POA: Diagnosis present

## 2017-12-13 DIAGNOSIS — F319 Bipolar disorder, unspecified: Secondary | ICD-10-CM | POA: Diagnosis not present

## 2017-12-13 DIAGNOSIS — Z79899 Other long term (current) drug therapy: Secondary | ICD-10-CM | POA: Insufficient documentation

## 2017-12-13 DIAGNOSIS — Z794 Long term (current) use of insulin: Secondary | ICD-10-CM | POA: Insufficient documentation

## 2017-12-13 DIAGNOSIS — Z992 Dependence on renal dialysis: Secondary | ICD-10-CM | POA: Insufficient documentation

## 2017-12-13 DIAGNOSIS — R197 Diarrhea, unspecified: Secondary | ICD-10-CM | POA: Insufficient documentation

## 2017-12-13 DIAGNOSIS — I509 Heart failure, unspecified: Secondary | ICD-10-CM | POA: Diagnosis not present

## 2017-12-13 DIAGNOSIS — E1122 Type 2 diabetes mellitus with diabetic chronic kidney disease: Secondary | ICD-10-CM | POA: Insufficient documentation

## 2017-12-13 DIAGNOSIS — Z9114 Patient's other noncompliance with medication regimen: Secondary | ICD-10-CM | POA: Diagnosis not present

## 2017-12-13 DIAGNOSIS — G4733 Obstructive sleep apnea (adult) (pediatric): Secondary | ICD-10-CM | POA: Diagnosis present

## 2017-12-13 DIAGNOSIS — I10 Essential (primary) hypertension: Secondary | ICD-10-CM

## 2017-12-13 DIAGNOSIS — R410 Disorientation, unspecified: Secondary | ICD-10-CM | POA: Diagnosis not present

## 2017-12-13 DIAGNOSIS — I132 Hypertensive heart and chronic kidney disease with heart failure and with stage 5 chronic kidney disease, or end stage renal disease: Secondary | ICD-10-CM | POA: Diagnosis not present

## 2017-12-13 DIAGNOSIS — R4182 Altered mental status, unspecified: Secondary | ICD-10-CM | POA: Diagnosis present

## 2017-12-13 LAB — I-STAT CHEM 8, ED
BUN: 49 mg/dL — ABNORMAL HIGH (ref 6–20)
Calcium, Ion: 1.2 mmol/L (ref 1.15–1.40)
Chloride: 104 mmol/L (ref 101–111)
Creatinine, Ser: 6.7 mg/dL — ABNORMAL HIGH (ref 0.44–1.00)
Glucose, Bld: 150 mg/dL — ABNORMAL HIGH (ref 65–99)
HCT: 40 % (ref 36.0–46.0)
HEMOGLOBIN: 13.6 g/dL (ref 12.0–15.0)
Potassium: 4.6 mmol/L (ref 3.5–5.1)
SODIUM: 142 mmol/L (ref 135–145)
TCO2: 25 mmol/L (ref 22–32)

## 2017-12-13 LAB — COMPREHENSIVE METABOLIC PANEL
ALBUMIN: 3.9 g/dL (ref 3.5–5.0)
ALT: 18 U/L (ref 14–54)
ANION GAP: 10 (ref 5–15)
AST: 13 U/L — AB (ref 15–41)
Alkaline Phosphatase: 106 U/L (ref 38–126)
BILIRUBIN TOTAL: 0.8 mg/dL (ref 0.3–1.2)
BUN: 57 mg/dL — ABNORMAL HIGH (ref 6–20)
CHLORIDE: 103 mmol/L (ref 101–111)
CO2: 28 mmol/L (ref 22–32)
Calcium: 9.6 mg/dL (ref 8.9–10.3)
Creatinine, Ser: 7.03 mg/dL — ABNORMAL HIGH (ref 0.44–1.00)
GFR calc Af Amer: 7 mL/min — ABNORMAL LOW (ref >60)
GFR, EST NON AFRICAN AMERICAN: 6 mL/min — AB (ref >60)
Glucose, Bld: 154 mg/dL — ABNORMAL HIGH (ref 65–99)
POTASSIUM: 4.9 mmol/L (ref 3.5–5.1)
Sodium: 141 mmol/L (ref 135–145)
TOTAL PROTEIN: 7.7 g/dL (ref 6.5–8.1)

## 2017-12-13 LAB — ACETAMINOPHEN LEVEL

## 2017-12-13 LAB — URINALYSIS, ROUTINE W REFLEX MICROSCOPIC
BACTERIA UA: NONE SEEN
Bilirubin Urine: NEGATIVE
Glucose, UA: 150 mg/dL — AB
Hgb urine dipstick: NEGATIVE
Ketones, ur: NEGATIVE mg/dL
LEUKOCYTES UA: NEGATIVE
Nitrite: NEGATIVE
Specific Gravity, Urine: 1.015 (ref 1.005–1.030)
pH: 8 (ref 5.0–8.0)

## 2017-12-13 LAB — DIFFERENTIAL
BASOS ABS: 0.1 10*3/uL (ref 0.0–0.1)
BASOS PCT: 1 %
EOS ABS: 0.1 10*3/uL (ref 0.0–0.7)
EOS PCT: 2 %
LYMPHS ABS: 2.6 10*3/uL (ref 0.7–4.0)
Lymphocytes Relative: 38 %
Monocytes Absolute: 0.4 10*3/uL (ref 0.1–1.0)
Monocytes Relative: 7 %
NEUTROS PCT: 52 %
Neutro Abs: 3.5 10*3/uL (ref 1.7–7.7)

## 2017-12-13 LAB — GLUCOSE, CAPILLARY: Glucose-Capillary: 127 mg/dL — ABNORMAL HIGH (ref 65–99)

## 2017-12-13 LAB — CBC
HCT: 40.8 % (ref 36.0–46.0)
HEMOGLOBIN: 12.6 g/dL (ref 12.0–15.0)
MCH: 29.3 pg (ref 26.0–34.0)
MCHC: 30.9 g/dL (ref 30.0–36.0)
MCV: 94.9 fL (ref 78.0–100.0)
PLATELETS: 166 10*3/uL (ref 150–400)
RBC: 4.3 MIL/uL (ref 3.87–5.11)
RDW: 16.4 % — ABNORMAL HIGH (ref 11.5–15.5)
WBC: 6.6 10*3/uL (ref 4.0–10.5)

## 2017-12-13 LAB — RAPID URINE DRUG SCREEN, HOSP PERFORMED
Amphetamines: NOT DETECTED
BENZODIAZEPINES: POSITIVE — AB
Barbiturates: NOT DETECTED
COCAINE: NOT DETECTED
OPIATES: POSITIVE — AB
Tetrahydrocannabinol: NOT DETECTED

## 2017-12-13 LAB — I-STAT BETA HCG BLOOD, ED (MC, WL, AP ONLY): HCG, QUANTITATIVE: 11.2 m[IU]/mL — AB (ref <5)

## 2017-12-13 LAB — I-STAT TROPONIN, ED: TROPONIN I, POC: 0.01 ng/mL (ref 0.00–0.08)

## 2017-12-13 LAB — SALICYLATE LEVEL

## 2017-12-13 LAB — ETHANOL: Alcohol, Ethyl (B): <10 mg/dL (ref <10)

## 2017-12-13 LAB — PROTIME-INR
INR: 1.15
Prothrombin Time: 14.6 seconds (ref 11.4–15.2)

## 2017-12-13 LAB — CBG MONITORING, ED: Glucose-Capillary: 144 mg/dL — ABNORMAL HIGH (ref 65–99)

## 2017-12-13 LAB — APTT: aPTT: 24 seconds (ref 24–36)

## 2017-12-13 MED ORDER — INSULIN ASPART 100 UNIT/ML ~~LOC~~ SOLN: 20.0000 [IU] | Freq: Every day | SUBCUTANEOUS | Status: DC

## 2017-12-13 MED ORDER — SODIUM CHLORIDE 0.9% FLUSH: 3.0000 mL | INTRAVENOUS | Status: DC | PRN

## 2017-12-13 MED ORDER — ATORVASTATIN CALCIUM 10 MG PO TABS
10.0000 mg | ORAL_TABLET | Freq: Every day | ORAL | Status: DC
  Administered 2017-12-13 – 2017-12-15 (×3): 10 mg via ORAL
  Filled 2017-12-13 (×3): qty 1

## 2017-12-13 MED ORDER — INSULIN ASPART 100 UNIT/ML ~~LOC~~ SOLN
0.0000 [IU] | Freq: Three times a day (TID) | SUBCUTANEOUS | Status: DC
  Administered 2017-12-14: 1 [IU] via SUBCUTANEOUS
  Administered 2017-12-14: 3 [IU] via SUBCUTANEOUS
  Administered 2017-12-14: 2 [IU] via SUBCUTANEOUS
  Administered 2017-12-15: 3 [IU] via SUBCUTANEOUS
  Administered 2017-12-15: 1 [IU] via SUBCUTANEOUS

## 2017-12-13 MED ORDER — RISPERIDONE 1 MG PO TABS
0.5000 mg | ORAL_TABLET | Freq: Two times a day (BID) | ORAL | Status: DC
  Administered 2017-12-13 – 2017-12-15 (×4): 0.5 mg via ORAL
  Filled 2017-12-13 (×4): qty 1

## 2017-12-13 MED ORDER — SODIUM CHLORIDE 0.9 % IV SOLN: 250.0000 mL | INTRAVENOUS | Status: DC | PRN

## 2017-12-13 MED ORDER — NEPRO/CARBSTEADY PO LIQD
237.0000 mL | Freq: Two times a day (BID) | ORAL | Status: DC
  Administered 2017-12-14 – 2017-12-15 (×3): 237 mL via ORAL

## 2017-12-13 MED ORDER — PAROXETINE HCL 20 MG PO TABS
20.0000 mg | ORAL_TABLET | Freq: Every day | ORAL | Status: DC
Start: 2017-12-13 — End: 2017-12-15
  Administered 2017-12-13 – 2017-12-15 (×3): 20 mg via ORAL
  Filled 2017-12-13 (×3): qty 1

## 2017-12-13 MED ORDER — HYDROCODONE-ACETAMINOPHEN 5-325 MG PO TABS
1.0000 | ORAL_TABLET | ORAL | Status: DC | PRN
  Administered 2017-12-13 – 2017-12-14 (×2): 1 via ORAL
  Filled 2017-12-13 (×3): qty 1

## 2017-12-13 MED ORDER — CARVEDILOL 12.5 MG PO TABS
12.5000 mg | ORAL_TABLET | Freq: Two times a day (BID) | ORAL | Status: DC
  Administered 2017-12-14 – 2017-12-15 (×2): 12.5 mg via ORAL
  Filled 2017-12-13 (×2): qty 1

## 2017-12-13 MED ORDER — ROPINIROLE HCL 1 MG PO TABS
1.0000 mg | ORAL_TABLET | Freq: Every day | ORAL | Status: DC
  Administered 2017-12-13 – 2017-12-14 (×2): 1 mg via ORAL
  Filled 2017-12-13 (×2): qty 1

## 2017-12-13 MED ORDER — HYDRALAZINE HCL 25 MG PO TABS
25.0000 mg | ORAL_TABLET | Freq: Two times a day (BID) | ORAL | Status: DC
  Administered 2017-12-13 – 2017-12-15 (×4): 25 mg via ORAL
  Filled 2017-12-13 (×4): qty 1

## 2017-12-13 MED ORDER — SODIUM CHLORIDE 0.9% FLUSH
3.0000 mL | Freq: Two times a day (BID) | INTRAVENOUS | Status: DC
  Administered 2017-12-13 – 2017-12-15 (×4): 3 mL via INTRAVENOUS

## 2017-12-13 NOTE — H&P (Signed)
History and Physical    Martha [name] EZM:[id number] DOB: 10-08-1965 DOA: 12/13/2017  PCP: Sinda Du, MD  Patient coming from: Home Chief Complaint: Confusion  HPI: Martha [name] is a 52 y.o. female with medical history significant of end-stage renal disease on dialysis last session was Friday which she only completed half because she was late due to an eye appointment, recent eye surgery, bipolar disorder, diabetes comes in with shortness of breath and confusion per husband.  Patient denies any fevers or cough.  She denies any significant swelling.  She denies any nausea vomiting or diarrhea.  She was unable to complete her dialysis session on Friday and has mild pulmonary edema on her chest x-ray.  Nephrology at [facility] was called who recommended observing her overnight for for dialysis in the morning.  Patient is referred for admission for confusion and the need for dialysis.  Her O2 sats are above 90% on room air.  Review of Systems: As per HPI otherwise 10 point review of systems negative.   Past Medical History:  Diagnosis Date  . Acid reflux    takes Tums  . Anemia   . Arthritis   . Bipolar 1 disorder ([place])   . Blindness of left eye   . Cervical radiculopathy   . CHF (congestive heart failure) ([place])   . Chronic kidney disease    Stage 5- 01/25/17  . COPD (chronic obstructive pulmonary disease) ([place]) 2014   bronchitis  . Degenerative disc disease, thoracic   . Depression   . Diabetes mellitus    Type II  . Diabetic retinopathy ([place])   . Hypertension   . Noncompliance with medication regimen   . Noncompliance with medication regimen   . Obesity (BMI 30-39.9)   . OSA (obstructive sleep apnea)    cpap  . Panic attack   . RLS (restless legs syndrome)     Past Surgical History:  Procedure Laterality Date  . AV FISTULA PLACEMENT Right 01/27/2017   Procedure: ARTERIOVENOUS (AV) FISTULA CREATION-RIGHT ARM;  Surgeon: Elam Dutch, MD;  Location: [facility];  Service:  Vascular;  Laterality: Right;  . BIOPSY N/A 05/17/2013   Procedure: BIOPSY;  Surgeon: [name], MD;  Location: [facility];  Service: Endoscopy;  Laterality: N/A;  . CHOLECYSTECTOMY N/A 04/27/2014   Procedure: LAPAROSCOPIC CHOLECYSTECTOMY;  Surgeon: [name], MD;  Location: [facility];  Service: General;  Laterality: N/A;  . COLONOSCOPY  06/06/2010   TKP:TWSFKC bleeding secondary to internal hemorrhoids but incomplete evaluation secondary to poor right colon prep/small rectal and sigmoid colon polyps (hyperplastic). PROPOFOL  . COLONOSCOPY  May 2013   Dr. Gilliam/[facility]: 5 mm a descending colon polyp, hyperplastic. Adequate bowel prep.  . ENDOMETRIAL ABLATION    . ESOPHAGOGASTRODUODENOSCOPY  06/06/2010   LEX:[id number] erythema and edema of body of stomach, with sessile polypoid lesions. bx benign. no h.pylori  . ESOPHAGOGASTRODUODENOSCOPY (EGD) WITH PROPOFOL N/A 05/17/2013   Dr. Oneida Alar: normal esophagus, moderate nodular gastritis, negative path, empiric Savary dilation  . EYE SURGERY  07/2017   sx for glaucoma  . FLEXIBLE SIGMOIDOSCOPY N/A 05/17/2013   Dr. Oneida Alar: moderate sized internal hemorrhoids  . HEMORRHOID BANDING N/A 05/17/2013   Procedure: HEMORRHOID BANDING;  Surgeon: [name], MD;  Location: [facility];  Service: Endoscopy;  Laterality: N/A;  2 bands placed  . INCISION AND DRAINAGE ABSCESS Left 10/11/2013   Procedure: INCISION AND DRAINAGE AND DEBRIDEMENT LEFT BREAST  ABSCESS;  Surgeon: [name], MD;  Location: [facility];  Service: General;  Laterality: Left;  . IRRIGATION AND DEBRIDEMENT ABSCESS Right 06/01/2013   Procedure: INCISION AND DRAINAGE AND DEBRIDEMENT ABSCESS RIGHT BREAST;  Surgeon: [name], MD;  Location: [facility];  Service: General;  Laterality: Right;  . SAVORY DILATION N/A 05/17/2013   Procedure: SAVORY DILATION;  Surgeon: [name], MD;  Location: [facility];  Service: Endoscopy;  Laterality: N/A;  14/15/16  . TUBAL LIGATION       reports  that she quit smoking about 10 years ago. Her smoking use included cigarettes. She has a 37.50 pack-year smoking history. She has never used smokeless tobacco. She reports that she does not drink alcohol or use drugs.  Allergies  Allergen Reactions  . Codeine Nausea And Vomiting  . Tape Other (See Comments)    Plastic Tape    Family History  Adopted: Yes  Family history unknown: Yes    Prior to Admission medications   Medication Sig Start Date End Date Taking? Authorizing Provider  ALPRAZolam Duanne Moron) 1 MG tablet Take 1 mg by mouth 4 (four) times daily as needed for anxiety (nerves).   Yes [provider]  atorvastatin (LIPITOR) 10 MG tablet Take 10 mg by mouth daily. Take along with 20 mg tablet to equal 30 mg daily.   Yes [provider]  calcium carbonate (TUMS EX) 750 MG chewable tablet Chew 1-2 tablets by mouth 6 (six) times daily. Patient takes 3 tablets with meals and 2 tablet with snacks three times a day   Yes [provider]  carvedilol (COREG) 12.5 MG tablet Take 1 tablet (12.5 mg total) by mouth 2 (two) times daily with a meal. 05/26/16  Yes Cherene Altes, MD  colchicine 0.6 MG tablet Take 1 tablet (0.6 mg total) by mouth every other day. 11/19/16   Sinda Du, MD  dicyclomine (BENTYL) 10 MG capsule Take 1 capsule (10 mg total) by mouth 4 (four) times daily -  before meals and at bedtime. 10/08/17   Annitta Needs, NP  hydrALAZINE (APRESOLINE) 25 MG tablet Take 1 tablet (25 mg total) by mouth every 8 (eight) hours. Patient taking differently: Take 25 mg by mouth 2 (two) times daily. Doesn't take am dose Mon, Wed, Fri 10/29/16   Sinda Du, MD  HYDROcodone-acetaminophen [facility]) 10-325 MG tablet Take 1 tablet by mouth every 6 (six) hours as needed for moderate pain. 01/27/17   Rhyne, Hulen Shouts, PA-C  insulin aspart (NOVOLOG) 100 UNIT/ML injection Inject 20-25 Units into the skin daily. 20-25 units per sliding scale 03/02/13   [provider]  lidocaine-prilocaine (EMLA) cream Apply 1 application topically as needed (topical anesthesia for hemodialysis if Gebauers and Lidocaine injection are ineffective.). 04/25/17   Johnson, Clanford L, MD  loperamide (IMODIUM) 2 MG capsule Take 1 capsule (2 mg total) by mouth 4 (four) times daily as needed for diarrhea or loose stools. 10/23/16   Evalee Jefferson, PA-C  PARoxetine (PAXIL) 20 MG tablet Take 1 daily. This is to prevent panic attacks Patient taking differently: Take 20-40 mg by mouth daily. Take 1 daily. This is to prevent panic attacks 10/28/16   Sinda Du, MD  risperiDONE (RISPERDAL) 0.5 MG tablet Take 1 tablet (0.5 mg total) by mouth 2 (two) times daily. 12/01/16   Sinda Du, MD  rOPINIRole (REQUIP) 1 MG tablet Take 1 mg by mouth at bedtime.     [provider]    Physical Exam: Vitals:   12/13/17 1710 12/13/17  1712 12/13/17 1713 12/13/17 1714  BP: (!) 169/89 (!) 163/85 (!) 171/98   Pulse: 70 66 69   Resp:      Temp:    98.8 F (37.1 C)  SpO2: 93% 94% 91%   Weight:      Height:          Constitutional: NAD, calm, comfortable Vitals:   12/13/17 1710 12/13/17 1712 12/13/17 1713 12/13/17 1714  BP: (!) 169/89 (!) 163/85 (!) 171/98   Pulse: 70 66 69   Resp:      Temp:    98.8 F (37.1 C)  SpO2: 93% 94% 91%   Weight:      Height:       Eyes: PERRL, lids and conjunctivae normal ENMT: Mucous membranes are moist. Posterior pharynx clear of any exudate or lesions.Normal dentition.  Neck: normal, supple, no masses, no thyromegaly Respiratory: clear to auscultation bilaterally, no wheezing, no crackles. Normal respiratory effort. No accessory muscle use.  Cardiovascular: Regular rate and rhythm, no murmurs / rubs / gallops. No extremity edema. 2+ pedal pulses. No carotid bruits.  Abdomen: no tenderness, no masses palpated. No hepatosplenomegaly. Bowel sounds positive.  Musculoskeletal: no clubbing / cyanosis. No joint deformity upper and lower extremities.  Good ROM, no contractures. Normal muscle tone.  Skin: no rashes, lesions, ulcers. No induration Neurologic: CN 2-12 grossly intact. Sensation intact, DTR normal. Strength 5/5 in all 4.  Psychiatric: Normal judgment and insight. Alert and oriented x 3. Normal mood.    Labs on Admission: I have personally reviewed following labs and imaging studies  CBC: Recent Labs  Lab 12/13/17 1610 12/13/17 1624  WBC 6.6  --   NEUTROABS 3.5  --   HGB 12.6 13.6  HCT 40.8 40.0  MCV 94.9  --   PLT 166  --    Basic Metabolic Panel: Recent Labs  Lab 12/13/17 1610 12/13/17 1624  NA 141 142  K 4.9 4.6  CL 103 104  CO2 28  --   GLUCOSE 154* 150*  BUN 57* 49*  CREATININE 7.03* 6.70*  CALCIUM 9.6  --    GFR: Estimated Creatinine Clearance: 14.7 mL/min (A) (by C-G formula based on SCr of 6.7 mg/dL (H)). Liver Function Tests: Recent Labs  Lab 12/13/17 1610  AST 13*  ALT 18  ALKPHOS 106  BILITOT 0.8  PROT 7.7  ALBUMIN 3.9   No results for input(s): LIPASE, AMYLASE in the last 168 hours. No results for input(s): AMMONIA in the last 168 hours. Coagulation Profile: Recent Labs  Lab 12/13/17 1657  INR 1.15   Cardiac Enzymes: No results for input(s): CKTOTAL, CKMB, CKMBINDEX, TROPONINI in the last 168 hours. BNP (last 3 results) No results for input(s): PROBNP in the last 8760 hours. HbA1C: No results for input(s): HGBA1C in the last 72 hours. CBG: Recent Labs  Lab 12/13/17 1557  GLUCAP 144*   Lipid Profile: No results for input(s): CHOL, HDL, LDLCALC, TRIG, CHOLHDL, LDLDIRECT in the last 72 hours. Thyroid Function Tests: No results for input(s): TSH, T4TOTAL, FREET4, T3FREE, THYROIDAB in the last 72 hours. Anemia Panel: No results for input(s): VITAMINB12, FOLATE, FERRITIN, TIBC, IRON, RETICCTPCT in the last 72 hours. Urine analysis:    Component Value Date/Time   COLORURINE YELLOW 12/13/2017 1822   APPEARANCEUR CLEAR 12/13/2017 1822   LABSPEC 1.015 12/13/2017 1822    PHURINE 8.0 12/13/2017 1822   GLUCOSEU 150 (A) 12/13/2017 1822   HGBUR NEGATIVE 12/13/2017 1822   BILIRUBINUR NEGATIVE 12/13/2017 1822  KETONESUR NEGATIVE 12/13/2017 1822   PROTEINUR >=300 (A) 12/13/2017 1822   UROBILINOGEN 0.2 03/17/2015 2236   NITRITE NEGATIVE 12/13/2017 1822   LEUKOCYTESUR NEGATIVE 12/13/2017 1822   Sepsis Labs: !!!!!!!!!!!!!!!!!!!!!!!!!!!!!!!!!!!!!!!!!!!! @LABRCNTIP (procalcitonin:4,lacticidven:4) )No results found for this or any previous visit (from the past 240 hour(s)).   Radiological Exams on Admission: Ct Head Wo Contrast  Result Date: 12/13/2017 CLINICAL DATA:  Altered level of consciousness EXAM: CT HEAD WITHOUT CONTRAST TECHNIQUE: Contiguous axial images were obtained from the base of the skull through the vertex without intravenous contrast. COMPARISON:  None. FINDINGS: Brain: No acute intracranial abnormality. Specifically, no hemorrhage, hydrocephalus, mass lesion, acute infarction, or significant intracranial injury. Vascular: No hyperdense vessel or unexpected calcification. Skull: No acute calvarial abnormality. Sinuses/Orbits: Visualized paranasal sinuses and mastoids clear. Orbital soft tissues unremarkable. Other: None IMPRESSION: Normal study. Electronically Signed   By: Rolm Baptise M.D.   On: 12/13/2017 16:37   Dg Chest Port 1 View  Result Date: 12/13/2017 CLINICAL DATA:  Recurrent lethargy, status post dialysis yesterday. LEFT periorbital bruising, eye surgery 1 week ago and January 2019. EXAM: PORTABLE CHEST 1 VIEW COMPARISON:  Chest radiograph April 20, 2017 FINDINGS: Cardiac silhouette is mildly enlarged and unchanged. Mediastinal silhouette is nonsuspicious. Mild pulmonary vascular congestion without pleural effusion or focal consolidation. No pneumothorax. Soft tissue planes and included osseous structures are nonsuspicious. IMPRESSION: Mild cardiomegaly and pulmonary vascular congestion. Electronically Signed   By: Elon Alas M.D.   On:  12/13/2017 16:53    Old chart reviewed Case discussed with Dr. Thurnell Garbe in the ED  Assessment/Plan 52 year old female end-stage renal dialysis patient comes in with need for dialysis Principal Problem:   Dialysis patient (HCC)-routine dialysis in the morning afebrile vital signs stable at this time  Active Problems:   Essential hypertension-continue home meds   Bipolar 1 disorder (HCC)-continue home meds   Noncompliance with medication regimen-noted   Chronic diarrhea-stable     DVT prophylaxis: SCDs Code Status: Full Family Communication: Husband Disposition Plan: Per day team Consults called: Nephrology Admission status: Observation   Swade Shonka A MD [facility]  If 7PM-7AM, please contact night-coverage [email or web address] Password [facility]  12/13/2017, 6:58 PM

## 2017-12-13 NOTE — ED Notes (Signed)
Call to floor for report Unable to give report  Bed marked ready

## 2017-12-13 NOTE — ED Notes (Signed)
To CT

## 2017-12-13 NOTE — ED Notes (Signed)
Pt to floor   Report to [facility]

## 2017-12-13 NOTE — ED Notes (Addendum)
Pt seen in ED Fri  After eye surgery  Went to Dialysis and did not finish dialysis   SO reports that she was to be seen by her eye doctor as soon as they could get there and pt stopped her dialysis to go to doctor  Since last pm pt has been lethargic and stumbling around so brought to ED

## 2017-12-13 NOTE — ED Notes (Signed)
Pt said she can't pee to provide sample.

## 2017-12-13 NOTE — ED Notes (Signed)
In and out cath with return of 300 cc light yellow, non smelling urine  Pt continues to state that she does not drink anything because the nurses at dialysis tell er not to drink anything  (She appears hydrated)  Urine spec to lab

## 2017-12-13 NOTE — ED Notes (Signed)
Call to floor for report  Debroah Baller, RN will call back

## 2017-12-13 NOTE — ED Notes (Signed)
Pt is alert and terse She is reassured re her labs and VS  She asks if she will be DC'd home and is reassured that she might This seems to make pt more terse  She passes the swallow test and tells this RN hat she is not supposed to have any water  This RN asks pt and her caregiver/SO to speak with neph to ascertain fluid intake parameters

## 2017-12-13 NOTE — ED Provider Notes (Signed)
[facility] EMERGENCY DEPARTMENT Provider Note   CSN: [id number] Arrival date & time: 12/13/17  1552     History   Chief Complaint Chief Complaint  Patient presents with  . Fatigue    HPI Martha [name] is a 52 y.o. female.  The history is provided by the patient and the spouse. The history is limited by the condition of the patient (AMS).   Pt was seen at 1600. Per pt and her spouse: Pt has had AMS since last night approximately 2030. Pt's spouse states she has been "confused," "hallucinating," "lethargic" and "stumbling around."  Pt went to her last HD appt Friday (2 days ago) but did not complete a full treatment due to having to go to her Eye doctor's office appointment. Eye MD started pt on acetazolamide PO and multiple eye drops 2 days ago. Denies fevers, no N/V/D, no falls, no CP/SOB, no cough, no abd pain, no focal motor weakness, no tingling/numbness in extremities.     Past Medical History:  Diagnosis Date  . Acid reflux    takes Tums  . Anemia   . Arthritis   . Bipolar 1 disorder ([place])   . Blindness of left eye   . Cervical radiculopathy   . CHF (congestive heart failure) ([place])   . Chronic kidney disease    Stage 5- 01/25/17  . COPD (chronic obstructive pulmonary disease) ([place]) 2014   bronchitis  . Degenerative disc disease, thoracic   . Depression   . Diabetes mellitus    Type II  . Diabetic retinopathy ([place])   . Hypertension   . Noncompliance with medication regimen   . Noncompliance with medication regimen   . Obesity (BMI 30-39.9)   . OSA (obstructive sleep apnea)    cpap  . Panic attack   . RLS (restless legs syndrome)     Patient Active Problem List   Diagnosis Date Noted  . Chronic diarrhea 10/08/2017  . Renal failure (ARF), acute on chronic (HCC) 04/22/2017  . Anasarca associated with disorder of kidney 04/20/2017  . Metabolic encephalopathy 44/09/4740  . Acute pulmonary edema ([place]) 04/05/2017  . Acute psychosis ([place]) 12/01/2016  .  Protein-calorie malnutrition, severe 11/29/2016  . Confusion 11/22/2016  . HTN (hypertension), malignant 11/20/2016  . Diabetes mellitus 11/20/2016  . Bipolar 1 disorder ([place]) 11/20/2016  . Noncompliance with medication regimen 11/20/2016  . Panic attack 11/20/2016  . CKD stage 5 secondary to hypertension ([place]) 11/15/2016  . AKI (acute kidney injury) ([place]) 11/15/2016  . Chest pain 10/27/2016  . CKD (chronic kidney disease) stage 5, GFR less than 15 ml/min (HCC) 10/27/2016  . Uncontrolled type 2 diabetes mellitus with hyperglycemia, without long-term current use of insulin ([place]) 10/27/2016  . Sensory disturbance 10/27/2016  . Left hemiparesis ([place]) 10/27/2016  . Acute on chronic renal failure ([place]) 05/25/2016  . Abdominal pain 04/26/2014  . Cholelithiasis 04/26/2014  . ARF (acute renal failure) ([place]) 04/26/2014  . Dehydration 04/26/2014  . Hyponatremia 04/26/2014  . Gallstones 04/06/2014  . Uncontrolled diabetes mellitus ([place]) 10/09/2013  . Breast abscess 05/30/2013  . Diabetes mellitus, type 2 ([place]) 05/30/2013  . Hyperglycemia 05/30/2013  . Internal hemorrhoids with other complication 59/56/3875  . Umbilical hernia without mention of obstruction or gangrene 04/15/2013  . Anemia 04/15/2013  . DM 05/13/2010  . Hyperlipidemia 05/13/2010  . Morbid obesity ([place]) 05/13/2010  . Depression 05/13/2010  . RESTLESS LEG SYNDROME 05/13/2010  . Essential hypertension 05/13/2010  . GERD 05/13/2010  . RECTAL BLEEDING 05/13/2010  .  Sleep apnea 05/13/2010  . Dysphagia, oropharyngeal phase 05/13/2010    Past Surgical History:  Procedure Laterality Date  . AV FISTULA PLACEMENT Right 01/27/2017   Procedure: ARTERIOVENOUS (AV) FISTULA CREATION-RIGHT ARM;  Surgeon: Elam Dutch, MD;  Location: [facility];  Service: Vascular;  Laterality: Right;  . BIOPSY N/A 05/17/2013   Procedure: BIOPSY;  Surgeon: [name], MD;  Location: [facility];  Service: Endoscopy;  Laterality: N/A;  . CHOLECYSTECTOMY  N/A 04/27/2014   Procedure: LAPAROSCOPIC CHOLECYSTECTOMY;  Surgeon: [name], MD;  Location: [facility];  Service: General;  Laterality: N/A;  . COLONOSCOPY  06/06/2010   PYK:DXIPJA bleeding secondary to internal hemorrhoids but incomplete evaluation secondary to poor right colon prep/small rectal and sigmoid colon polyps (hyperplastic). PROPOFOL  . COLONOSCOPY  May 2013   Dr. Gilliam/[facility]: 5 mm a descending colon polyp, hyperplastic. Adequate bowel prep.  . ENDOMETRIAL ABLATION    . ESOPHAGOGASTRODUODENOSCOPY  06/06/2010   SNK:NLZJQBH erythema and edema of body of stomach, with sessile polypoid lesions. bx benign. no h.pylori  . ESOPHAGOGASTRODUODENOSCOPY (EGD) WITH PROPOFOL N/A 05/17/2013   Dr. Oneida Alar: normal esophagus, moderate nodular gastritis, negative path, empiric Savary dilation  . EYE SURGERY  07/2017   sx for glaucoma  . FLEXIBLE SIGMOIDOSCOPY N/A 05/17/2013   Dr. Oneida Alar: moderate sized internal hemorrhoids  . HEMORRHOID BANDING N/A 05/17/2013   Procedure: HEMORRHOID BANDING;  Surgeon: [name], MD;  Location: [facility];  Service: Endoscopy;  Laterality: N/A;  2 bands placed  . INCISION AND DRAINAGE ABSCESS Left 10/11/2013   Procedure: INCISION AND DRAINAGE AND DEBRIDEMENT LEFT BREAST  ABSCESS;  Surgeon: [name], MD;  Location: [facility];  Service: General;  Laterality: Left;  . IRRIGATION AND DEBRIDEMENT ABSCESS Right 06/01/2013   Procedure: INCISION AND DRAINAGE AND DEBRIDEMENT ABSCESS RIGHT BREAST;  Surgeon: [name], MD;  Location: [facility];  Service: General;  Laterality: Right;  . SAVORY DILATION N/A 05/17/2013   Procedure: SAVORY DILATION;  Surgeon: [name], MD;  Location: [facility];  Service: Endoscopy;  Laterality: N/A;  14/15/16  . TUBAL LIGATION       OB History    Gravida  2   Para  2   Term  1   Preterm  1   AB      Living  2     SAB      TAB      Ectopic      Multiple      Live Births               Home  Medications    Prior to Admission medications   Medication Sig Start Date End Date Taking? Authorizing Provider  ALPRAZolam Duanne Moron) 1 MG tablet Take 1 mg by mouth 4 (four) times daily as needed for anxiety (nerves).    [provider]  atorvastatin (LIPITOR) 10 MG tablet Take 10 mg by mouth daily. Take along with 20 mg tablet to equal 30 mg daily.    [provider]  calcium carbonate (TUMS EX) 750 MG chewable tablet Chew 1-2 tablets by mouth 6 (six) times daily. Patient takes 3 tablets with meals and 2 tablet with snacks three times a day    [provider]  carvedilol (COREG) 12.5 MG tablet Take 1 tablet (12.5 mg total) by mouth 2 (two) times daily with a meal. 05/26/16   Cherene Altes, MD  colchicine 0.6 MG tablet Take 1 tablet (0.6 mg total)  by mouth every other day. 11/19/16   Sinda Du, MD  dicyclomine (BENTYL) 10 MG capsule Take 1 capsule (10 mg total) by mouth 4 (four) times daily -  before meals and at bedtime. 10/08/17   Annitta Needs, NP  hydrALAZINE (APRESOLINE) 25 MG tablet Take 1 tablet (25 mg total) by mouth every 8 (eight) hours. Patient taking differently: Take 25 mg by mouth 2 (two) times daily. Doesn't take am dose Mon, Wed, Fri 10/29/16   Sinda Du, MD  HYDROcodone-acetaminophen [facility]) 10-325 MG tablet Take 1 tablet by mouth every 6 (six) hours as needed for moderate pain. 01/27/17   Rhyne, Hulen Shouts, PA-C  insulin aspart (NOVOLOG) 100 UNIT/ML injection Inject 20-25 Units into the skin daily. 20-25 units per sliding scale 03/02/13   [provider]  lidocaine-prilocaine (EMLA) cream Apply 1 application topically as needed (topical anesthesia for hemodialysis if Gebauers and Lidocaine injection are ineffective.). 04/25/17   Johnson, Clanford L, MD  loperamide (IMODIUM) 2 MG capsule Take 1 capsule (2 mg total) by mouth 4 (four) times daily as needed for diarrhea or loose stools. 10/23/16   Evalee Jefferson, PA-C  PARoxetine (PAXIL) 20 MG  tablet Take 1 daily. This is to prevent panic attacks Patient taking differently: Take 20-40 mg by mouth daily. Take 1 daily. This is to prevent panic attacks 10/28/16   Sinda Du, MD  risperiDONE (RISPERDAL) 0.5 MG tablet Take 1 tablet (0.5 mg total) by mouth 2 (two) times daily. 12/01/16   Sinda Du, MD  rOPINIRole (REQUIP) 1 MG tablet Take 1 mg by mouth at bedtime.     [provider]    Family History Family History  Adopted: Yes  Family history unknown: Yes    Social History Social History   Tobacco Use  . Smoking status: Former Smoker    Packs/day: 1.50    Years: 25.00    Pack years: 37.50    Types: Cigarettes    Last attempt to quit: 10/23/2007    Years since quitting: 10.1  . Smokeless tobacco: Never Used  . Tobacco comment: Quit x 10 years ago  Substance Use Topics  . Alcohol use: No  . Drug use: No     Allergies   Codeine and Tape   Review of Systems Review of Systems  Unable to perform ROS: Mental status change     Physical Exam Updated Vital Signs BP (!) 163/85   Pulse 66   Temp 98.8 F (37.1 C)   Resp 18   Ht 5\' 7"  (1.702 m)   Wt 136.1 kg (300 lb)   SpO2 94%   BMI 46.99 kg/m   Physical Exam 1605: Physical examination:  Nursing notes reviewed; Vital signs and O2 SAT reviewed;  Constitutional: Well developed, Well nourished, Well hydrated, In no acute distress; Head:  Normocephalic, atraumatic; Eyes: EOMI, PERRL, No scleral icterus; ENMT: Mouth and pharynx normal, Mucous membranes moist; Neck: Supple, Full range of motion, No lymphadenopathy; Cardiovascular: Regular rate and rhythm, No gallop; Respiratory: Breath sounds clear & equal bilaterally, No wheezes.  Speaking full sentences with ease, Normal respiratory effort/excursion; Chest: Nontender, Movement normal; Abdomen: Soft, Nontender, Nondistended, Normal bowel sounds; Genitourinary: No CVA tenderness; Extremities: Peripheral pulses normal, No tenderness, No edema, No calf edema  or asymmetry.; Neuro: AA&Ox3, Major CN grossly intact. No facial droop. Speech clear. Generally weak x4 extremities, moves all extremities spontaneously and to command without apparent gross focal motor deficits in extremities.; Skin: Color normal, Warm, Dry.  ED Treatments / Results  Labs (all labs ordered are listed, but only abnormal results are displayed)   EKG EKG Interpretation  Date/Time:  Sunday Dec 13 2017 16:16:43 EDT Ventricular Rate:  68 PR Interval:    QRS Duration: 75 QT Interval:  405 QTC Calculation: 431 R Axis:   56 Text Interpretation:  Sinus rhythm Baseline wander When compared with ECG of 04/20/2017 No significant change was found Confirmed by Francine Graven [phone]) on 12/13/2017 4:24:40 PM   Radiology   Procedures Procedures (including critical care time)  Medications Ordered in ED Medications - No data to display   Initial Impression / Assessment and Plan / ED Course  I have reviewed the triage vital signs and the nursing notes.  Pertinent labs & imaging results that were available during my care of the patient were reviewed by me and considered in my medical decision making (see chart for details).  MDM Reviewed: previous chart, nursing note and vitals Reviewed previous: labs and ECG Interpretation: labs, ECG, x-ray and CT scan    Results for orders placed or performed during the hospital encounter of 12/13/17  Ethanol  Result Value Ref Range   Alcohol, Ethyl (B) <10 <10 mg/dL  Protime-INR  Result Value Ref Range   Prothrombin Time 14.6 11.4 - 15.2 seconds   INR 1.15   APTT  Result Value Ref Range   aPTT 24 24 - 36 seconds  CBC  Result Value Ref Range   WBC 6.6 4.0 - 10.5 K/uL   RBC 4.30 3.87 - 5.11 MIL/uL   Hemoglobin 12.6 12.0 - 15.0 g/dL   HCT 40.8 36.0 - 46.0 %   MCV 94.9 78.0 - 100.0 fL   MCH 29.3 26.0 - 34.0 pg   MCHC 30.9 30.0 - 36.0 g/dL   RDW 16.4 (H) 11.5 - 15.5 %   Platelets 166 150 - 400 K/uL  Differential  Result  Value Ref Range   Neutrophils Relative % 52 %   Neutro Abs 3.5 1.7 - 7.7 K/uL   Lymphocytes Relative 38 %   Lymphs Abs 2.6 0.7 - 4.0 K/uL   Monocytes Relative 7 %   Monocytes Absolute 0.4 0.1 - 1.0 K/uL   Eosinophils Relative 2 %   Eosinophils Absolute 0.1 0.0 - 0.7 K/uL   Basophils Relative 1 %   Basophils Absolute 0.1 0.0 - 0.1 K/uL  Comprehensive metabolic panel  Result Value Ref Range   Sodium 141 135 - 145 mmol/L   Potassium 4.9 3.5 - 5.1 mmol/L   Chloride 103 101 - 111 mmol/L   CO2 28 22 - 32 mmol/L   Glucose, Bld 154 (H) 65 - 99 mg/dL   BUN 57 (H) 6 - 20 mg/dL   Creatinine, Ser 7.03 (H) 0.44 - 1.00 mg/dL   Calcium 9.6 8.9 - 10.3 mg/dL   Total Protein 7.7 6.5 - 8.1 g/dL   Albumin 3.9 3.5 - 5.0 g/dL   AST 13 (L) 15 - 41 U/L   ALT 18 14 - 54 U/L   Alkaline Phosphatase 106 38 - 126 U/L   Total Bilirubin 0.8 0.3 - 1.2 mg/dL   GFR calc non Af Amer 6 (L) >60 mL/min   GFR calc Af Amer 7 (L) >60 mL/min   Anion gap 10 5 - 15  Acetaminophen level  Result Value Ref Range   Acetaminophen (Tylenol), Serum <10 (L) 10 - 30 ug/mL  Salicylate level  Result Value Ref Range   Salicylate Lvl <4.5 2.8 -  30.0 mg/dL  CBG monitoring, ED  Result Value Ref Range   Glucose-Capillary 144 (H) 65 - 99 mg/dL  I-stat troponin, ED (not at [facility], [facility])  Result Value Ref Range   Troponin i, poc 0.01 0.00 - 0.08 ng/mL   Comment 3          I-Stat beta hCG blood, ED  Result Value Ref Range   I-stat hCG, quantitative 11.2 (H) <5 mIU/mL   Comment 3          I-Stat Chem 8, ED  (not at [facility], [facility])  Result Value Ref Range   Sodium 142 135 - 145 mmol/L   Potassium 4.6 3.5 - 5.1 mmol/L   Chloride 104 101 - 111 mmol/L   BUN 49 (H) 6 - 20 mg/dL   Creatinine, Ser 6.70 (H) 0.44 - 1.00 mg/dL   Glucose, Bld 150 (H) 65 - 99 mg/dL   Calcium, Ion 1.20 1.15 - 1.40 mmol/L   TCO2 25 22 - 32 mmol/L   Hemoglobin 13.6 12.0 - 15.0 g/dL   HCT 40.0 36.0 - 46.0 %   Ct Head Wo Contrast Result Date:  12/13/2017 CLINICAL DATA:  Altered level of consciousness EXAM: CT HEAD WITHOUT CONTRAST TECHNIQUE: Contiguous axial images were obtained from the base of the skull through the vertex without intravenous contrast. COMPARISON:  None. FINDINGS: Brain: No acute intracranial abnormality. Specifically, no hemorrhage, hydrocephalus, mass lesion, acute infarction, or significant intracranial injury. Vascular: No hyperdense vessel or unexpected calcification. Skull: No acute calvarial abnormality. Sinuses/Orbits: Visualized paranasal sinuses and mastoids clear. Orbital soft tissues unremarkable. Other: None IMPRESSION: Normal study. Electronically Signed   By: Rolm Baptise M.D.   On: 12/13/2017 16:37   Dg Chest Port 1 View Result Date: 12/13/2017 CLINICAL DATA:  Recurrent lethargy, status post dialysis yesterday. LEFT periorbital bruising, eye surgery 1 week ago and January 2019. EXAM: PORTABLE CHEST 1 VIEW COMPARISON:  Chest radiograph April 20, 2017 FINDINGS: Cardiac silhouette is mildly enlarged and unchanged. Mediastinal silhouette is nonsuspicious. Mild pulmonary vascular congestion without pleural effusion or focal consolidation. No pneumothorax. Soft tissue planes and included osseous structures are nonsuspicious. IMPRESSION: Mild cardiomegaly and pulmonary vascular congestion. Electronically Signed   By: Elon Alas M.D.   On: 12/13/2017 16:53   Results for EDLIN, FORD (MRN [id number]) as of 12/13/2017 17:42  Ref. Range 04/22/2017 05:17 04/23/2017 05:58 04/24/2017 05:52 04/25/2017 06:30 07/07/2017 11:51 12/13/2017 16:10 12/13/2017 16:24  BUN Latest Ref Range: 6 - 20 mg/dL 49 (H) 53 (H) 40 (H) 44 (H) 27 (H) 57 (H) 49 (H)  Creatinine Latest Ref Range: 0.44 - 1.00 mg/dL 4.85 (H) 5.19 (H) 4.33 (H) 5.04 (H) 3.05 (H) 7.03 (H) 6.70 (H)    1810:  Pt remains awake/alert. UA/UC pending. T/C returned from [facility] Dr. Jonnie Finner, case discussed, including:  HPI, pertinent PM/SHx, VS/PE, dx testing, ED course and  treatment:  no need for emergent HD at this time, OK to admit at [facility] for HD tomorrow.   1835:  T/C returned from [facility] Dr. Shanon Brow, case discussed, including:  HPI, pertinent PM/SHx, VS/PE, dx testing, ED course and treatment:  Agreeable to admit.    Final Clinical Impressions(s) / ED Diagnoses   Final diagnoses:  None    ED Discharge Orders    None       Francine Graven, DO 12/16/17 0818

## 2017-12-13 NOTE — ED Triage Notes (Signed)
Patient brought in by fiance for being lethargic and being repetitive. Patient had similar episode last year and had to be admitted and placed on dialysis. Patient able to be aroused verbally and answering questions appropriately. Patien has left orbitally bruising, per patient eye surgery on Monday. Denies any falls. Patient states she has not been eating and she is diabetic.

## 2017-12-14 LAB — BASIC METABOLIC PANEL
Anion gap: 13 (ref 5–15)
BUN: 60 mg/dL — ABNORMAL HIGH (ref 6–20)
CALCIUM: 9.4 mg/dL (ref 8.9–10.3)
CHLORIDE: 103 mmol/L (ref 101–111)
CO2: 26 mmol/L (ref 22–32)
CREATININE: 7.34 mg/dL — AB (ref 0.44–1.00)
GFR, EST AFRICAN AMERICAN: 7 mL/min — AB (ref >60)
GFR, EST NON AFRICAN AMERICAN: 6 mL/min — AB (ref >60)
Glucose, Bld: 169 mg/dL — ABNORMAL HIGH (ref 65–99)
Potassium: 4 mmol/L (ref 3.5–5.1)
SODIUM: 142 mmol/L (ref 135–145)

## 2017-12-14 LAB — CBC
HCT: 39 % (ref 36.0–46.0)
Hemoglobin: 12.2 g/dL (ref 12.0–15.0)
MCH: 29.5 pg (ref 26.0–34.0)
MCHC: 31.3 g/dL (ref 30.0–36.0)
MCV: 94.2 fL (ref 78.0–100.0)
PLATELETS: 160 10*3/uL (ref 150–400)
RBC: 4.14 MIL/uL (ref 3.87–5.11)
RDW: 16.1 % — AB (ref 11.5–15.5)
WBC: 6 10*3/uL (ref 4.0–10.5)

## 2017-12-14 LAB — GLUCOSE, CAPILLARY
GLUCOSE-CAPILLARY: 166 mg/dL — AB (ref 65–99)
Glucose-Capillary: 207 mg/dL — ABNORMAL HIGH (ref 65–99)
Glucose-Capillary: 144 mg/dL — ABNORMAL HIGH (ref 65–99)

## 2017-12-14 MED ORDER — HEPARIN SODIUM (PORCINE) 1000 UNIT/ML DIALYSIS
1000.0000 [IU] | INTRAMUSCULAR | Status: DC | PRN
  Filled 2017-12-14: qty 1

## 2017-12-14 MED ORDER — LIDOCAINE-PRILOCAINE 2.5-2.5 % EX CREA: 1.0000 "application " | TOPICAL_CREAM | CUTANEOUS | Status: DC | PRN

## 2017-12-14 MED ORDER — LIDOCAINE HCL (PF) 1 % IJ SOLN: 5.0000 mL | INTRAMUSCULAR | Status: DC | PRN

## 2017-12-14 MED ORDER — HEPARIN SODIUM (PORCINE) 1000 UNIT/ML IJ SOLN
INTRAMUSCULAR | Status: AC
  Administered 2017-12-14: 2800 [IU] via INTRAVENOUS_CENTRAL
  Filled 2017-12-14: qty 1

## 2017-12-14 MED ORDER — LATANOPROST 0.005 % OP SOLN
1.0000 [drp] | Freq: Every day | OPHTHALMIC | Status: DC
  Administered 2017-12-14: 1 [drp] via OPHTHALMIC

## 2017-12-14 MED ORDER — PENTAFLUOROPROP-TETRAFLUOROETH EX AERO: 1.0000 "application " | INHALATION_SPRAY | CUTANEOUS | Status: DC | PRN

## 2017-12-14 MED ORDER — BRIMONIDINE TARTRATE-TIMOLOL 0.2-0.5 % OP SOLN
1.0000 [drp] | Freq: Two times a day (BID) | OPHTHALMIC | Status: DC
  Administered 2017-12-14 – 2017-12-15 (×2): 1 [drp] via OPHTHALMIC
  Filled 2017-12-14: qty 5

## 2017-12-14 MED ORDER — HEPARIN SODIUM (PORCINE) 1000 UNIT/ML DIALYSIS
20.0000 [IU]/kg | INTRAMUSCULAR | Status: DC | PRN
  Administered 2017-12-14: 2800 [IU] via INTRAVENOUS_CENTRAL
  Filled 2017-12-14 (×2): qty 3

## 2017-12-14 MED ORDER — BRIMONIDINE TARTRATE 0.2 % OP SOLN: 1.0000 [drp] | Freq: Three times a day (TID) | OPHTHALMIC | Status: DC

## 2017-12-14 MED ORDER — SODIUM CHLORIDE 0.9 % IV SOLN: 100.0000 mL | INTRAVENOUS | Status: DC | PRN

## 2017-12-14 NOTE — Progress Notes (Signed)
Initial Nutrition Assessment  DOCUMENTATION CODES:   Morbid obesity  INTERVENTION:  Nepro Shake po BID, each supplement provides 425 kcal and 19 grams protein  Renal diet   Consider adding renal vitamin   NUTRITION DIAGNOSIS:   Inadequate oral intake related to decreased appetite, nausea(complains of increase feeling of nausea with food smells) as evidenced by per patient/family report(husband relays that pt po intake has been poor 4-5 days prior to admission).  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 Hx of non-compliance with meds. Confused on admission, incomplete HD treatment on 5/17. Patient complaining with pain in left eye. Today during RD visit patient is feeling nauseated which she relates to the smell of food. Her intake has been poor the past 4-5 days per husband. Today she did not eat breakfast or lunch and was complaining of nausea.   Review of weight hx indicates significant fluctuations -not uncommon for a person with ESRD-HD and CHF. Usual weight is 300 lb range per husband. Currently she is 6.3 kg (14 lb above UBW).  Husband is the cook for the household. Patient unable to stand long enough to prepare meals.   Labs:  BMP Latest Ref Rng & Units 12/14/2017 12/13/2017 12/13/2017  Glucose 65 - 99 mg/dL 169(H) 150(H) 154(H)  BUN 6 - 20 mg/dL 60(H) 49(H) 57(H)  Creatinine 0.44 - 1.00 mg/dL 7.34(H) 6.70(H) 7.03(H)  Sodium 135 - 145 mmol/L 142 142 141  Potassium 3.5 - 5.1 mmol/L 4.0 4.6 4.9  Chloride 101 - 111 mmol/L 103 104 103  CO2 22 - 32 mmol/L 26 - 28  Calcium 8.9 - 10.3 mg/dL 9.4 - 9.6   CBG (last 3)  Recent Labs    12/13/17 2151 12/14/17 0810 12/14/17 1157  GLUCAP 127* 166* 207*   Husband says  usual fasting glucose 95-120 mg/dl in the  Mornings.  Scheduled Meds: . atorvastatin  10 mg Oral Daily  . brimonidine-timolol  1 drop Left Eye Q12H  . carvedilol  12.5 mg Oral BID WC  . feeding supplement (NEPRO CARB STEADY)  237 mL Oral BID BM  . hydrALAZINE  25 mg Oral BID  . insulin aspart  0-9 Units Subcutaneous TID WC  . latanoprost  1 drop Left Eye QHS  . PARoxetine  20-40 mg Oral Daily  . risperiDONE  0.5 mg Oral BID  . rOPINIRole  1 mg Oral QHS  . sodium chloride flush  3 mL Intravenous Q12H   Continuous Infusions: . sodium chloride    . sodium chloride    . sodium chloride     Past Medical History:  Diagnosis Date  . Acid reflux    takes Tums  . Anemia   . Arthritis   . Bipolar 1 disorder ([place])   . Blindness of left eye   . Cervical radiculopathy   . CHF (congestive heart failure) ([place])   . Chronic kidney disease    Stage 5- 01/25/17  . COPD (chronic obstructive pulmonary disease) ([place]) 2014   bronchitis  . Degenerative disc disease, thoracic   . Depression   . Diabetes mellitus    Type II  . Diabetic retinopathy ([place])   . Hypertension   . Noncompliance  with medication regimen   . Noncompliance with medication regimen   . Obesity (BMI 30-39.9)   . OSA (obstructive sleep apnea)    cpap  . Panic attack   . RLS (restless legs syndrome)      NUTRITION - FOCUSED PHYSICAL EXAM: Deferred today    Diet Order:   Diet Order           Diet renal/carb modified with fluid restriction Diet-HS Snack? Nothing; Fluid restriction: 1200 mL Fluid; Room service appropriate? Yes; Fluid consistency: Thin  Diet effective now          EDUCATION NEEDS:  Not appropriate for education at this time   Skin:  Skin Assessment: Reviewed RN Assessment  Last BM:  5/18  Height:   Ht Readings from Last 1 Encounters:  12/13/17 5\' 7"  (1.702 m)    Weight:   Wt Readings from Last 1 Encounters:  12/14/17 (!) 313 lb 15 oz (142.4 kg)    Ideal Body Weight:  61 kg  BMI:   Body mass index is 49.17 kg/m.  Estimated Nutritional Needs:   Kcal:  2300-2500 (28-30 kcal/kg ABW)  Protein:  98-114 kg (1.2-1.4 gr/kg ABW)  Fluid:  1200 ml    Colman Cater MS,RD,CSG,LDN Office: [phone] Pager: [phone]

## 2017-12-14 NOTE — Consult Note (Signed)
Martha [name] MRN: [id number] DOB/AGE: 01/30/1966 52 y.o. Primary Care Physician:Hawkins, Percell Miller, MD Admit date: 12/13/2017 Chief Complaint:  Chief Complaint  Patient presents with  . Fatigue   HPI:Pt is 52 year old female with past medical hx of ESRD who was brought to ER with c/o Confusion.  HPI dates back to Saturday when pt's spouse noticed that she has been "confused. Pt spouse alos gave hx that  Pt went to her last HD appt Fridaybut did not complete a full treatment due to having to go to her Eye doctor's office appointment. Pt seen today on 3rd floor. Pt says " I do not know what happened"  Pt offer no c/o fevers/cough. NO c/o Nausea/Vomiting/Diarrhea.  No c/o falls  No c/o Chest Pain/ Shortness Of Breath.  NO c/o  abd pain. NO c/o focal motor weakness, no tingling/numbness in extremities.       Past Medical History:  Diagnosis Date  . Acid reflux    takes Tums  . Anemia   . Arthritis   . Bipolar 1 disorder ([place])   . Blindness of left eye   . Cervical radiculopathy   . CHF (congestive heart failure) ([place])   . Chronic kidney disease    Stage 5- 01/25/17  . COPD (chronic obstructive pulmonary disease) ([place]) 2014   bronchitis  . Degenerative disc disease, thoracic   . Depression   . Diabetes mellitus    Type II  . Diabetic retinopathy ([place])   . Hypertension   . Noncompliance with medication regimen   . Noncompliance with medication regimen   . Obesity (BMI 30-39.9)   . OSA (obstructive sleep apnea)    cpap  . Panic attack   . RLS (restless legs syndrome)         Family History  Adopted: Yes  Family history unknown: Yes    Social History:  reports that she quit smoking about 10 years ago. Her smoking use included cigarettes. She has a 37.50 pack-year smoking history. She has never used smokeless tobacco. She reports that she does not drink alcohol or use drugs.   Allergies:  Allergies  Allergen Reactions  . Codeine Nausea And Vomiting  . Tape  Other (See Comments)    Plastic Tape    Medications Prior to Admission  Medication Sig Dispense Refill  . ALPRAZolam (XANAX) 1 MG tablet Take 1 mg by mouth 4 (four) times daily as needed for anxiety (nerves).    Marland Kitchen atorvastatin (LIPITOR) 10 MG tablet Take 10 mg by mouth daily. Take along with 20 mg tablet to equal 30 mg daily.    . calcium carbonate (TUMS EX) 750 MG chewable tablet Chew 1-2 tablets by mouth 6 (six) times daily. Patient takes 3 tablets with meals and 2 tablet with snacks three times a day    . carvedilol (COREG) 12.5 MG tablet Take 1 tablet (12.5 mg total) by mouth 2 (two) times daily with a meal. 60 tablet 0  . colchicine 0.6 MG tablet Take 1 tablet (0.6 mg total) by mouth every other day. 16 tablet 12  . dicyclomine (BENTYL) 10 MG capsule Take 1 capsule (10 mg total) by mouth 4 (four) times daily -  before meals and at bedtime. 120 capsule 3  . hydrALAZINE (APRESOLINE) 25 MG tablet Take 1 tablet (25 mg total) by mouth every 8 (eight) hours. (Patient taking differently: Take 25 mg by mouth 2 (two) times daily. Doesn't take am dose Mon, Wed, Fri) 90 tablet 12  .  HYDROcodone-acetaminophen (NORCO) 10-325 MG tablet Take 1 tablet by mouth every 6 (six) hours as needed for moderate pain. 6 tablet 0  . insulin aspart (NOVOLOG) 100 UNIT/ML injection Inject 20-25 Units into the skin daily. 20-25 units per sliding scale    . lidocaine-prilocaine (EMLA) cream Apply 1 application topically as needed (topical anesthesia for hemodialysis if Gebauers and Lidocaine injection are ineffective.). 30 g 0  . loperamide (IMODIUM) 2 MG capsule Take 1 capsule (2 mg total) by mouth 4 (four) times daily as needed for diarrhea or loose stools. 12 capsule 0  . PARoxetine (PAXIL) 20 MG tablet Take 1 daily. This is to prevent panic attacks (Patient taking differently: Take 20-40 mg by mouth daily. Take 1 daily. This is to prevent panic attacks) 30 tablet 12  . risperiDONE (RISPERDAL) 0.5 MG tablet Take 1 tablet  (0.5 mg total) by mouth 2 (two) times daily.    Marland Kitchen rOPINIRole (REQUIP) 1 MG tablet Take 1 mg by mouth at bedtime.          UQJ:FHLKT from the symptoms mentioned above,there are no other symptoms referable to all systems reviewed.  Marland Kitchen atorvastatin  10 mg Oral Daily  . carvedilol  12.5 mg Oral BID WC  . feeding supplement (NEPRO CARB STEADY)  237 mL Oral BID BM  . hydrALAZINE  25 mg Oral BID  . insulin aspart  0-9 Units Subcutaneous TID WC  . PARoxetine  20-40 mg Oral Daily  . risperiDONE  0.5 mg Oral BID  . rOPINIRole  1 mg Oral QHS  . sodium chloride flush  3 mL Intravenous Q12H         GYB:WLSLH from the symptoms mentioned above,there are no other symptoms referable to all systems reviewed.  Physical Exam: Vital signs in last 24 hours: Temp:  [98.1 F (36.7 C)-98.8 F (37.1 C)] 98.6 F (37 C) (05/20 0502) Pulse Rate:  [63-74] 73 (05/20 0907) Resp:  [14-20] 18 (05/20 0907) BP: (133-177)/(76-138) 177/84 (05/20 0907) SpO2:  [87 %-98 %] 94 % (05/20 0907) Weight:  [300 lb (136.1 kg)-313 lb 15 oz (142.4 kg)] 313 lb 15 oz (142.4 kg) (05/20 0502) Weight change:  Last BM Date: 12/12/17  Intake/Output from previous day: 05/19 0701 - 05/20 0700 In: 120 [P.O.:120] Out: -  No intake/output data recorded.   Physical Exam: General- pt is awake,alert, follows commands Resp- No acute REsp distress, CTA B/L NO Rhonchi, Cpap in situ CVS- S1S2 regular in rate and rhythm GIT- BS+, soft, NT, ND,obese EXT- NO LE Edema, Cyanosis CNS- CN 2-12 grossly intact. Moving all 4 extremities Psych- normal mood and affect Access- AVF   Lab Results: CBC Recent Labs    12/13/17 1610 12/13/17 1624 12/14/17 0459  WBC 6.6  --  6.0  HGB 12.6 13.6 12.2  HCT 40.8 40.0 39.0  PLT 166  --  160    BMET Recent Labs    12/13/17 1610 12/13/17 1624 12/14/17 0459  NA 141 142 142  K 4.9 4.6 4.0  CL 103 104 103  CO2 28  --  26  GLUCOSE 154* 150* 169*  BUN 57* 49* 60*  CREATININE 7.03*  6.70* 7.34*  CALCIUM 9.6  --  9.4    MICRO No results found for this or any previous visit (from the past 240 hour(s)).    Lab Results  Component Value Date   PTH 103 (H) 04/10/2017   PTH Comment 04/10/2017   CALCIUM 9.4 12/14/2017   CAION 1.20 12/13/2017  PHOS 5.1 (H) 04/25/2017      Impression: 1)Renal  ESRD on HD                Pt is on Monday/Wednesday/Friday schedule                Pt last Hd was on Friday ( Incomplete session)  2)HTN  Medication-  On Alpha and beta Blockers. On Vasodilators- Hydralzine    3)Anemia In ESR the goal for HGb is 9--11( on ESA) NO need of ESA  4)CKD Mineral-Bone Disorder Secondary Hyperparathyroidism  present  Phosphorus at goal. Calcium is at goal.  5)CNS-admitted with AMS Clinically better Primary MD following  6)Electrolytes Normokalemic NOrmonatremic   7)Acid base Co2 at goal     Plan:   Will dialyze today NO need of EPO   Rajvi Armentor S 12/14/2017, 9:37 AM

## 2017-12-14 NOTE — Progress Notes (Addendum)
Subjective: She was admitted with confusion.  She was unable to finish her last dialysis procedure.  She had a similar episode of confusion about a year ago that was prolonged and seemed to be related to urinary tract infection.  Objective: Vital signs in last 24 hours: Temp:  [98.1 F (36.7 C)-98.8 F (37.1 C)] 98.6 F (37 C) (05/20 0502) Pulse Rate:  [63-74] 72 (05/20 0502) Resp:  [14-20] 16 (05/20 0502) BP: (133-175)/(76-138) 175/76 (05/19 2020) SpO2:  [87 %-98 %] 92 % (05/20 0502) Weight:  [136.1 kg (300 lb)-142.4 kg (313 lb 15 oz)] 142.4 kg (313 lb 15 oz) (05/20 0502) Weight change:  Last BM Date: 12/12/17  Intake/Output from previous day: 05/19 0701 - 05/20 0700 In: 120 [P.O.:120] Out: -   PHYSICAL EXAM General appearance: alert and Confused Resp: clear to auscultation bilaterally Cardio: regular rate and rhythm, S1, S2 normal, no murmur, click, rub or gallop GI: soft, non-tender; bowel sounds normal; no masses,  no organomegaly Extremities: extremities normal, atraumatic, no cyanosis or edema  Lab Results:  Results for orders placed or performed during the hospital encounter of 12/13/17 (from the past 48 hour(s))  CBG monitoring, ED     Status: Abnormal   Collection Time: 12/13/17  3:57 PM  Result Value Ref Range   Glucose-Capillary 144 (H) 65 - 99 mg/dL  Ethanol     Status: None   Collection Time: 12/13/17  4:10 PM  Result Value Ref Range   Alcohol, Ethyl (B) <10 <10 mg/dL    Comment: (NOTE) Lowest detectable limit for serum alcohol is 10 mg/dL. For medical purposes only. Performed at [facility], [address] [place], [place] [address]   CBC     Status: Abnormal   Collection Time: 12/13/17  4:10 PM  Result Value Ref Range   WBC 6.6 4.0 - 10.5 K/uL   RBC 4.30 3.87 - 5.11 MIL/uL   Hemoglobin 12.6 12.0 - 15.0 g/dL   HCT 40.8 36.0 - 46.0 %   MCV 94.9 78.0 - 100.0 fL   MCH 29.3 26.0 - 34.0 pg   MCHC 30.9 30.0 - 36.0 g/dL   RDW 16.4 (H) 11.5 - 15.5 %   Platelets 166 150 - 400 K/uL    Comment: Performed at [facility], [address] [place], [place] [address]  Differential     Status: None   Collection Time: 12/13/17  4:10 PM  Result Value Ref Range   Neutrophils Relative % 52 %   Neutro Abs 3.5 1.7 - 7.7 K/uL   Lymphocytes Relative 38 %   Lymphs Abs 2.6 0.7 - 4.0 K/uL   Monocytes Relative 7 %   Monocytes Absolute 0.4 0.1 - 1.0 K/uL   Eosinophils Relative 2 %   Eosinophils Absolute 0.1 0.0 - 0.7 K/uL   Basophils Relative 1 %   Basophils Absolute 0.1 0.0 - 0.1 K/uL    Comment: Performed at [facility], [address] , [place] [address]  Comprehensive metabolic panel     Status: Abnormal   Collection Time: 12/13/17  4:10 PM  Result Value Ref Range   Sodium 141 135 - 145 mmol/L   Potassium 4.9 3.5 - 5.1 mmol/L   Chloride 103 101 - 111 mmol/L   CO2 28 22 - 32 mmol/L   Glucose, Bld 154 (H) 65 - 99 mg/dL   BUN 57 (H) 6 - 20 mg/dL   Creatinine, Ser 7.03 (H) 0.44 - 1.00 mg/dL   Calcium 9.6 8.9 -   10.3 mg/dL   Total Protein 7.7 6.5 - 8.1 g/dL   Albumin 3.9 3.5 - 5.0 g/dL   AST 13 (L) 15 - 41 U/L   ALT 18 14 - 54 U/L   Alkaline Phosphatase 106 38 - 126 U/L   Total Bilirubin 0.8 0.3 - 1.2 mg/dL   GFR calc non Af Amer 6 (L) >60 mL/min   GFR calc Af Amer 7 (L) >60 mL/min    Comment: (NOTE) The eGFR has been calculated using the CKD EPI equation. This calculation has not been validated in all clinical situations. eGFR's persistently <60 mL/min signify possible Chronic Kidney Disease.    Anion gap 10 5 - 15    Comment: Performed at [facility], [address] [place], [place] [address]  Acetaminophen level     Status: Abnormal   Collection Time: 12/13/17  4:10 PM  Result Value Ref Range   Acetaminophen (Tylenol), Serum <10 (L) 10 - 30 ug/mL    Comment: (NOTE) Therapeutic concentrations vary significantly. A range of 10-30 ug/mL  may be an effective concentration for many patients. However, some  are best treated at  concentrations outside of this range. Acetaminophen concentrations >150 ug/mL at 4 hours after ingestion  and >50 ug/mL at 12 hours after ingestion are often associated with  toxic reactions. Performed at [facility], [address] [place], [place] [address]   Salicylate level     Status: None   Collection Time: 12/13/17  4:10 PM  Result Value Ref Range   Salicylate Lvl <7.0 2.8 - 30.0 mg/dL    Comment: Performed at  Hospital, [address] [place], [place] [address]  I-stat troponin, ED (not at [facility], [facility])     Status: None   Collection Time: 12/13/17  4:21 PM  Result Value Ref Range   Troponin i, poc 0.01 0.00 - 0.08 ng/mL   Comment 3            Comment: Due to the release kinetics of cTnI, a negative result within the first hours of the onset of symptoms does not rule out myocardial infarction with certainty. If myocardial infarction is still suspected, repeat the test at appropriate intervals.   I-Stat Chem 8, ED  (not at [facility], [facility])     Status: Abnormal   Collection Time: 12/13/17  4:24 PM  Result Value Ref Range   Sodium 142 135 - 145 mmol/L   Potassium 4.6 3.5 - 5.1 mmol/L   Chloride 104 101 - 111 mmol/L   BUN 49 (H) 6 - 20 mg/dL   Creatinine, Ser 6.70 (H) 0.44 - 1.00 mg/dL   Glucose, Bld 150 (H) 65 - 99 mg/dL   Calcium, Ion 1.20 1.15 - 1.40 mmol/L   TCO2 25 22 - 32 mmol/L   Hemoglobin 13.6 12.0 - 15.0 g/dL   HCT 40.0 36.0 - 46.0 %  I-Stat beta hCG blood, ED     Status: Abnormal   Collection Time: 12/13/17  4:34 PM  Result Value Ref Range   I-stat hCG, quantitative 11.2 (H) <5 mIU/mL   Comment 3            Comment:   GEST. AGE      CONC.  (mIU/mL)   <=1 WEEK        5 - 50     2 WEEKS       50 - 500     3 WEEKS       100 - 10,000       4 WEEKS     1,000 - 30,000        FEMALE AND NON-PREGNANT FEMALE:     LESS THAN 5 mIU/mL   Protime-INR     Status: None   Collection Time: 12/13/17  4:57 PM  Result Value Ref Range   Prothrombin Time 14.6 11.4 - 15.2 seconds    INR 1.15     Comment: Performed at [facility], [address] [place], [place] [address]  APTT     Status: None   Collection Time: 12/13/17  4:57 PM  Result Value Ref Range   aPTT 24 24 - 36 seconds    Comment: Performed at [facility], [address], [place], [place] [address]  Urine rapid drug screen (hosp performed)not at [facility]     Status: Abnormal   Collection Time: 12/13/17  6:22 PM  Result Value Ref Range   Opiates POSITIVE (A) NONE DETECTED   Cocaine NONE DETECTED NONE DETECTED   Benzodiazepines POSITIVE (A) NONE DETECTED   Amphetamines NONE DETECTED NONE DETECTED   Tetrahydrocannabinol NONE DETECTED NONE DETECTED   Barbiturates NONE DETECTED NONE DETECTED    Comment: (NOTE) DRUG SCREEN FOR MEDICAL PURPOSES ONLY.  IF CONFIRMATION IS NEEDED FOR ANY PURPOSE, NOTIFY LAB WITHIN 5 DAYS. LOWEST DETECTABLE LIMITS FOR URINE DRUG SCREEN Drug Class                     Cutoff (ng/mL) Amphetamine and metabolites    1000 Barbiturate and metabolites    200 Benzodiazepine                 062 Tricyclics and metabolites     300 Opiates and metabolites        300 Cocaine and metabolites        300 THC                            50 Performed at [facility] [place], [place] [address]   Urinalysis, Routine w reflex microscopic     Status: Abnormal   Collection Time: 12/13/17  6:22 PM  Result Value Ref Range   Color, Urine YELLOW YELLOW   APPearance CLEAR CLEAR   Specific Gravity, Urine 1.015 1.005 - 1.030   pH 8.0 5.0 - 8.0   Glucose, UA 150 (A) NEGATIVE mg/dL   Hgb urine dipstick NEGATIVE NEGATIVE   Bilirubin Urine NEGATIVE NEGATIVE   Ketones, ur NEGATIVE NEGATIVE mg/dL   Protein, ur >=300 (A) NEGATIVE mg/dL   Nitrite NEGATIVE NEGATIVE   Leukocytes, UA NEGATIVE NEGATIVE   RBC / HPF 0-5 0 - 5 RBC/hpf   WBC, UA 0-5 0 - 5 WBC/hpf   Bacteria, UA NONE SEEN NONE SEEN   Squamous Epithelial / LPF 0-5 0 - 5    Comment: Performed at [facility], [address]  [place], [place] [address]  Glucose, capillary     Status: Abnormal   Collection Time: 12/13/17  9:51 PM  Result Value Ref Range   Glucose-Capillary 127 (H) 65 - 99 mg/dL   Comment 1 Notify RN    Comment 2 Document in Chart   Basic metabolic panel     Status: Abnormal   Collection Time: 12/14/17  4:59 AM  Result Value Ref Range   Sodium 142 135 - 145 mmol/L   Potassium 4.0 3.5 - 5.1 mmol/L   Chloride 103 101 - 111 mmol/L   CO2  26 22 - 32 mmol/L   Glucose, Bld 169 (H) 65 - 99 mg/dL   BUN 60 (H) 6 - 20 mg/dL   Creatinine, Ser 7.34 (H) 0.44 - 1.00 mg/dL   Calcium 9.4 8.9 - 10.3 mg/dL   GFR calc non Af Amer 6 (L) >60 mL/min   GFR calc Af Amer 7 (L) >60 mL/min    Comment: (NOTE) The eGFR has been calculated using the CKD EPI equation. This calculation has not been validated in all clinical situations. eGFR's persistently <60 mL/min signify possible Chronic Kidney Disease.    Anion gap 13 5 - 15    Comment: Performed at [facility], [address] [place], [place] [address]  CBC     Status: Abnormal   Collection Time: 12/14/17  4:59 AM  Result Value Ref Range   WBC 6.0 4.0 - 10.5 K/uL   RBC 4.14 3.87 - 5.11 MIL/uL   Hemoglobin 12.2 12.0 - 15.0 g/dL   HCT 39.0 36.0 - 46.0 %   MCV 94.2 78.0 - 100.0 fL   MCH 29.5 26.0 - 34.0 pg   MCHC 31.3 30.0 - 36.0 g/dL   RDW 16.1 (H) 11.5 - 15.5 %   Platelets 160 150 - 400 K/uL    Comment: Performed at [facility], [address] [place], [place] [address]  Glucose, capillary     Status: Abnormal   Collection Time: 12/14/17  8:10 AM  Result Value Ref Range   Glucose-Capillary 166 (H) 65 - 99 mg/dL    ABGS Recent Labs    12/13/17 1624  TCO2 25   CULTURES No results found for this or any previous visit (from the past 240 hour(s)). Studies/Results: Ct Head Wo Contrast  Result Date: 12/13/2017 CLINICAL DATA:  Altered level of consciousness EXAM: CT HEAD WITHOUT CONTRAST TECHNIQUE: Contiguous axial images were obtained from the base of the  skull through the vertex without intravenous contrast. COMPARISON:  None. FINDINGS: Brain: No acute intracranial abnormality. Specifically, no hemorrhage, hydrocephalus, mass lesion, acute infarction, or significant intracranial injury. Vascular: No hyperdense vessel or unexpected calcification. Skull: No acute calvarial abnormality. Sinuses/Orbits: Visualized paranasal sinuses and mastoids clear. Orbital soft tissues unremarkable. Other: None IMPRESSION: Normal study. Electronically Signed   By: Kevin  Dover M.D.   On: 12/13/2017 16:37   Dg Chest Port 1 View  Result Date: 12/13/2017 CLINICAL DATA:  Recurrent lethargy, status post dialysis yesterday. LEFT periorbital bruising, eye surgery 1 week ago and January 2019. EXAM: PORTABLE CHEST 1 VIEW COMPARISON:  Chest radiograph April 20, 2017 FINDINGS: Cardiac silhouette is mildly enlarged and unchanged. Mediastinal silhouette is nonsuspicious. Mild pulmonary vascular congestion without pleural effusion or focal consolidation. No pneumothorax. Soft tissue planes and included osseous structures are nonsuspicious. IMPRESSION: Mild cardiomegaly and pulmonary vascular congestion. Electronically Signed   By: Courtnay  Bloomer M.D.   On: 12/13/2017 16:53    Medications:  Prior to Admission:  Medications Prior to Admission  Medication Sig Dispense Refill Last Dose  . ALPRAZolam (XANAX) 1 MG tablet Take 1 mg by mouth 4 (four) times daily as needed for anxiety (nerves).   12/12/2017 at Unknown time  . atorvastatin (LIPITOR) 10 MG tablet Take 10 mg by mouth daily. Take along with 20 mg tablet to equal 30 mg daily.   12/12/2017 at Unknown time  . calcium carbonate (TUMS EX) 750 MG chewable tablet Chew 1-2 tablets by mouth 6 (six) times daily. Patient takes 3 tablets with meals and 2 tablet with snacks three   times a day   12/12/2017 at Unknown time  . carvedilol (COREG) 12.5 MG tablet Take 1 tablet (12.5 mg total) by mouth 2 (two) times daily with a meal. 60 tablet 0  12/12/2017 at 9pm  . colchicine 0.6 MG tablet Take 1 tablet (0.6 mg total) by mouth every other day. 16 tablet 12 12/11/2017 at Unknown time  . dicyclomine (BENTYL) 10 MG capsule Take 1 capsule (10 mg total) by mouth 4 (four) times daily -  before meals and at bedtime. 120 capsule 3 12/10/2017 at Unknown time  . hydrALAZINE (APRESOLINE) 25 MG tablet Take 1 tablet (25 mg total) by mouth every 8 (eight) hours. (Patient taking differently: Take 25 mg by mouth 2 (two) times daily. Doesn't take am dose Mon, Wed, Fri) 90 tablet 12 12/10/2017 at Unknown time  . HYDROcodone-acetaminophen (NORCO) 10-325 MG tablet Take 1 tablet by mouth every 6 (six) hours as needed for moderate pain. 6 tablet 0 12/11/2017 at Unknown time  . insulin aspart (NOVOLOG) 100 UNIT/ML injection Inject 20-25 Units into the skin daily. 20-25 units per sliding scale     . lidocaine-prilocaine (EMLA) cream Apply 1 application topically as needed (topical anesthesia for hemodialysis if Gebauers and Lidocaine injection are ineffective.). 30 g 0 12/10/2017 at Unknown time  . loperamide (IMODIUM) 2 MG capsule Take 1 capsule (2 mg total) by mouth 4 (four) times daily as needed for diarrhea or loose stools. 12 capsule 0 Taking  . PARoxetine (PAXIL) 20 MG tablet Take 1 daily. This is to prevent panic attacks (Patient taking differently: Take 20-40 mg by mouth daily. Take 1 daily. This is to prevent panic attacks) 30 tablet 12 12/10/2017 at Unknown time  . risperiDONE (RISPERDAL) 0.5 MG tablet Take 1 tablet (0.5 mg total) by mouth 2 (two) times daily.   12/10/2017 at Unknown time  . rOPINIRole (REQUIP) 1 MG tablet Take 1 mg by mouth at bedtime.    12/10/2017 at Unknown time   Scheduled: . atorvastatin  10 mg Oral Daily  . carvedilol  12.5 mg Oral BID WC  . feeding supplement (NEPRO CARB STEADY)  237 mL Oral BID BM  . hydrALAZINE  25 mg Oral BID  . insulin aspart  0-9 Units Subcutaneous TID WC  . PARoxetine  20-40 mg Oral Daily  . risperiDONE  0.5 mg  Oral BID  . rOPINIRole  1 mg Oral QHS  . sodium chloride flush  3 mL Intravenous Q12H   Continuous: . sodium chloride     PRN:sodium chloride, HYDROcodone-acetaminophen, sodium chloride flush  Assesment: She has confusion.  She is on dialysis and could not finish her last dialysis treatment so it may be from that.  However she did have a severe episode of confusion about a year ago that was related to UTI and her baseline anxiety and depression/bipolar 1 disorder. Principal Problem:   Dialysis patient (HCC) Active Problems:   Essential hypertension   Bipolar 1 disorder (HCC)   Noncompliance with medication regimen   Chronic diarrhea    Plan: For nephrology consultation.  See how she responds to dialysis    LOS: 0 days   , L 12/14/2017, 8:40 AM  

## 2017-12-14 NOTE — Procedures (Signed)
     HEMODIALYSIS TREATMENT NOTE:    4 hour low-heparin dialysis completed via right upper arm AVF (15g/antegrade). Goal nearly met: 1.9L removed.  Ultrafiltration was interrupted and  NS bolus given for one hypotensive episode 3 hours into session.  All blood was returned and hemostasis was achieved in 10 minutes. Report given to Jeanella Craze, RN.   Rockwell Alexandria, RN, CDN

## 2017-12-15 LAB — GLUCOSE, CAPILLARY
GLUCOSE-CAPILLARY: 149 mg/dL — AB (ref 65–99)
GLUCOSE-CAPILLARY: 228 mg/dL — AB (ref 65–99)

## 2017-12-15 NOTE — Progress Notes (Signed)
Subjective: She has no complaints.  She says she feels okay.  She is back to baseline mental status  Objective: Vital signs in last 24 hours: Temp:  [97.8 F (36.6 C)-98.9 F (37.2 C)] 98.9 F (37.2 C) (05/21 0606) Pulse Rate:  [57-73] 70 (05/21 0828) Resp:  [14-18] 18 (05/21 0828) BP: (90-177)/(48-91) 169/89 (05/21 0828) SpO2:  [91 %-96 %] 91 % (05/21 0828) Weight:  [142.9 kg (315 lb 0.6 oz)] 142.9 kg (315 lb 0.6 oz) (05/20 1845) Weight change: 6.821 kg (15 lb 0.6 oz) Last BM Date: 12/12/17  Intake/Output from previous day: 05/20 0701 - 05/21 0700 In: 540 [P.O.:540] Out: 1898   PHYSICAL EXAM General appearance: alert, cooperative and no distress Resp: clear to auscultation bilaterally Cardio: regular rate and rhythm, S1, S2 normal, no murmur, click, rub or gallop GI: soft, non-tender; bowel sounds normal; no masses,  no organomegaly Extremities: extremities normal, atraumatic, no cyanosis or edema  Lab Results:  Results for orders placed or performed during the hospital encounter of 12/13/17 (from the past 48 hour(s))  CBG monitoring, ED     Status: Abnormal   Collection Time: 12/13/17  3:57 PM  Result Value Ref Range   Glucose-Capillary 144 (H) 65 - 99 mg/dL  Ethanol     Status: None   Collection Time: 12/13/17  4:10 PM  Result Value Ref Range   Alcohol, Ethyl (B) <10 <10 mg/dL    Comment: (NOTE) Lowest detectable limit for serum alcohol is 10 mg/dL. For medical purposes only. Performed at [facility], [address], [place], [place] [address]   CBC     Status: Abnormal   Collection Time: 12/13/17  4:10 PM  Result Value Ref Range   WBC 6.6 4.0 - 10.5 K/uL   RBC 4.30 3.87 - 5.11 MIL/uL   Hemoglobin 12.6 12.0 - 15.0 g/dL   HCT 40.8 36.0 - 46.0 %   MCV 94.9 78.0 - 100.0 fL   MCH 29.3 26.0 - 34.0 pg   MCHC 30.9 30.0 - 36.0 g/dL   RDW 16.4 (H) 11.5 - 15.5 %   Platelets 166 150 - 400 K/uL    Comment: Performed at [facility], [address], [place], [place]  [address]  Differential     Status: None   Collection Time: 12/13/17  4:10 PM  Result Value Ref Range   Neutrophils Relative % 52 %   Neutro Abs 3.5 1.7 - 7.7 K/uL   Lymphocytes Relative 38 %   Lymphs Abs 2.6 0.7 - 4.0 K/uL   Monocytes Relative 7 %   Monocytes Absolute 0.4 0.1 - 1.0 K/uL   Eosinophils Relative 2 %   Eosinophils Absolute 0.1 0.0 - 0.7 K/uL   Basophils Relative 1 %   Basophils Absolute 0.1 0.0 - 0.1 K/uL    Comment: Performed at [facility], [address], [place], [place] [address]  Comprehensive metabolic panel     Status: Abnormal   Collection Time: 12/13/17  4:10 PM  Result Value Ref Range   Sodium 141 135 - 145 mmol/L   Potassium 4.9 3.5 - 5.1 mmol/L   Chloride 103 101 - 111 mmol/L   CO2 28 22 - 32 mmol/L   Glucose, Bld 154 (H) 65 - 99 mg/dL   BUN 57 (H) 6 - 20 mg/dL   Creatinine, Ser 7.03 (H) 0.44 - 1.00 mg/dL   Calcium 9.6 8.9 - 10.3 mg/dL   Total Protein 7.7 6.5 - 8.1 g/dL   Albumin 3.9  3.5 - 5.0 g/dL   AST 13 (L) 15 - 41 U/L   ALT 18 14 - 54 U/L   Alkaline Phosphatase 106 38 - 126 U/L   Total Bilirubin 0.8 0.3 - 1.2 mg/dL   GFR calc non Af Amer 6 (L) >60 mL/min   GFR calc Af Amer 7 (L) >60 mL/min    Comment: (NOTE) The eGFR has been calculated using the CKD EPI equation. This calculation has not been validated in all clinical situations. eGFR's persistently <60 mL/min signify possible Chronic Kidney Disease.    Anion gap 10 5 - 15    Comment: Performed at [facility], [address], [place], [place] [address]  Acetaminophen level     Status: Abnormal   Collection Time: 12/13/17  4:10 PM  Result Value Ref Range   Acetaminophen (Tylenol), Serum <10 (L) 10 - 30 ug/mL    Comment: (NOTE) Therapeutic concentrations vary significantly. A range of 10-30 ug/mL  may be an effective concentration for many patients. However, some  are best treated at concentrations outside of this range. Acetaminophen concentrations >150 ug/mL at 4 hours after ingestion   and >50 ug/mL at 12 hours after ingestion are often associated with  toxic reactions. Performed at [facility], [address], [name], [place] [address]   Salicylate level     Status: None   Collection Time: 12/13/17  4:10 PM  Result Value Ref Range   Salicylate Lvl <0.9 2.8 - 30.0 mg/dL    Comment: Performed at [facility], [address], [place], [place] [address]  I-stat troponin, ED (not at [facility], [facility])     Status: None   Collection Time: 12/13/17  4:21 PM  Result Value Ref Range   Troponin i, poc 0.01 0.00 - 0.08 ng/mL   Comment 3            Comment: Due to the release kinetics of cTnI, a negative result within the first hours of the onset of symptoms does not rule out myocardial infarction with certainty. If myocardial infarction is still suspected, repeat the test at appropriate intervals.   I-Stat Chem 8, ED  (not at [facility], [facility])     Status: Abnormal   Collection Time: 12/13/17  4:24 PM  Result Value Ref Range   Sodium 142 135 - 145 mmol/L   Potassium 4.6 3.5 - 5.1 mmol/L   Chloride 104 101 - 111 mmol/L   BUN 49 (H) 6 - 20 mg/dL   Creatinine, Ser 6.70 (H) 0.44 - 1.00 mg/dL   Glucose, Bld 150 (H) 65 - 99 mg/dL   Calcium, Ion 1.20 1.15 - 1.40 mmol/L   TCO2 25 22 - 32 mmol/L   Hemoglobin 13.6 12.0 - 15.0 g/dL   HCT 40.0 36.0 - 46.0 %  I-Stat beta hCG blood, ED     Status: Abnormal   Collection Time: 12/13/17  4:34 PM  Result Value Ref Range   I-stat hCG, quantitative 11.2 (H) <5 mIU/mL   Comment 3            Comment:   GEST. AGE      CONC.  (mIU/mL)   <=1 WEEK        5 - 50     2 WEEKS       50 - 500     3 WEEKS       100 - 10,000     4 WEEKS     1,000 - 30,000  FEMALE AND NON-PREGNANT FEMALE:     LESS THAN 5 mIU/mL   Protime-INR     Status: None   Collection Time: 12/13/17  4:57 PM  Result Value Ref Range   Prothrombin Time 14.6 11.4 - 15.2 seconds   INR 1.15     Comment: Performed at [facility], [address] [place], [place] [address]  APTT      Status: None   Collection Time: 12/13/17  4:57 PM  Result Value Ref Range   aPTT 24 24 - 36 seconds    Comment: Performed at [facility], [address], [place], [place] [address]  Urine rapid drug screen (hosp performed)not at [facility]     Status: Abnormal   Collection Time: 12/13/17  6:22 PM  Result Value Ref Range   Opiates POSITIVE (A) NONE DETECTED   Cocaine NONE DETECTED NONE DETECTED   Benzodiazepines POSITIVE (A) NONE DETECTED   Amphetamines NONE DETECTED NONE DETECTED   Tetrahydrocannabinol NONE DETECTED NONE DETECTED   Barbiturates NONE DETECTED NONE DETECTED    Comment: (NOTE) DRUG SCREEN FOR MEDICAL PURPOSES ONLY.  IF CONFIRMATION IS NEEDED FOR ANY PURPOSE, NOTIFY LAB WITHIN 5 DAYS. LOWEST DETECTABLE LIMITS FOR URINE DRUG SCREEN Drug Class                     Cutoff (ng/mL) Amphetamine and metabolites    1000 Barbiturate and metabolites    200 Benzodiazepine                 628 Tricyclics and metabolites     300 Opiates and metabolites        300 Cocaine and metabolites        300 THC                            50 Performed at [facility] [place], [place] [address]   Urinalysis, Routine w reflex microscopic     Status: Abnormal   Collection Time: 12/13/17  6:22 PM  Result Value Ref Range   Color, Urine YELLOW YELLOW   APPearance CLEAR CLEAR   Specific Gravity, Urine 1.015 1.005 - 1.030   pH 8.0 5.0 - 8.0   Glucose, UA 150 (A) NEGATIVE mg/dL   Hgb urine dipstick NEGATIVE NEGATIVE   Bilirubin Urine NEGATIVE NEGATIVE   Ketones, ur NEGATIVE NEGATIVE mg/dL   Protein, ur >=300 (A) NEGATIVE mg/dL   Nitrite NEGATIVE NEGATIVE   Leukocytes, UA NEGATIVE NEGATIVE   RBC / HPF 0-5 0 - 5 RBC/hpf   WBC, UA 0-5 0 - 5 WBC/hpf   Bacteria, UA NONE SEEN NONE SEEN   Squamous Epithelial / LPF 0-5 0 - 5    Comment: Performed at [facility], [address] [place], [place] [address]  Glucose, capillary     Status: Abnormal   Collection Time: 12/13/17  9:51 PM   Result Value Ref Range   Glucose-Capillary 127 (H) 65 - 99 mg/dL   Comment 1 Notify RN    Comment 2 Document in Chart   Basic metabolic panel     Status: Abnormal   Collection Time: 12/14/17  4:59 AM  Result Value Ref Range   Sodium 142 135 - 145 mmol/L   Potassium 4.0 3.5 - 5.1 mmol/L   Chloride 103 101 - 111 mmol/L   CO2 26 22 - 32 mmol/L   Glucose, Bld 169 (H) 65 - 99 mg/dL  BUN 60 (H) 6 - 20 mg/dL   Creatinine, Ser 7.34 (H) 0.44 - 1.00 mg/dL   Calcium 9.4 8.9 - 10.3 mg/dL   GFR calc non Af Amer 6 (L) >60 mL/min   GFR calc Af Amer 7 (L) >60 mL/min    Comment: (NOTE) The eGFR has been calculated using the CKD EPI equation. This calculation has not been validated in all clinical situations. eGFR's persistently <60 mL/min signify possible Chronic Kidney Disease.    Anion gap 13 5 - 15    Comment: Performed at [facility], [address], [place], [place] [address]  CBC     Status: Abnormal   Collection Time: 12/14/17  4:59 AM  Result Value Ref Range   WBC 6.0 4.0 - 10.5 K/uL   RBC 4.14 3.87 - 5.11 MIL/uL   Hemoglobin 12.2 12.0 - 15.0 g/dL   HCT 39.0 36.0 - 46.0 %   MCV 94.2 78.0 - 100.0 fL   MCH 29.5 26.0 - 34.0 pg   MCHC 31.3 30.0 - 36.0 g/dL   RDW 16.1 (H) 11.5 - 15.5 %   Platelets 160 150 - 400 K/uL    Comment: Performed at [facility], [address] [place], [place] [address]  Glucose, capillary     Status: Abnormal   Collection Time: 12/14/17  8:10 AM  Result Value Ref Range   Glucose-Capillary 166 (H) 65 - 99 mg/dL  Glucose, capillary     Status: Abnormal   Collection Time: 12/14/17 11:57 AM  Result Value Ref Range   Glucose-Capillary 207 (H) 65 - 99 mg/dL  Glucose, capillary     Status: Abnormal   Collection Time: 12/14/17  4:08 PM  Result Value Ref Range   Glucose-Capillary 144 (H) 65 - 99 mg/dL   Comment 1 Notify RN   Glucose, capillary     Status: Abnormal   Collection Time: 12/15/17  7:50 AM  Result Value Ref Range   Glucose-Capillary 149 (H) 65 -  99 mg/dL   Comment 1 Notify RN     ABGS Recent Labs    12/13/17 1624  TCO2 25   CULTURES No results found for this or any previous visit (from the past 240 hour(s)). Studies/Results: Ct Head Wo Contrast  Result Date: 12/13/2017 CLINICAL DATA:  Altered level of consciousness EXAM: CT HEAD WITHOUT CONTRAST TECHNIQUE: Contiguous axial images were obtained from the base of the skull through the vertex without intravenous contrast. COMPARISON:  None. FINDINGS: Brain: No acute intracranial abnormality. Specifically, no hemorrhage, hydrocephalus, mass lesion, acute infarction, or significant intracranial injury. Vascular: No hyperdense vessel or unexpected calcification. Skull: No acute calvarial abnormality. Sinuses/Orbits: Visualized paranasal sinuses and mastoids clear. Orbital soft tissues unremarkable. Other: None IMPRESSION: Normal study. Electronically Signed   By: Rolm Baptise M.D.   On: 12/13/2017 16:37   Dg Chest Port 1 View  Result Date: 12/13/2017 CLINICAL DATA:  Recurrent lethargy, status post dialysis yesterday. LEFT periorbital bruising, eye surgery 1 week ago and January 2019. EXAM: PORTABLE CHEST 1 VIEW COMPARISON:  Chest radiograph April 20, 2017 FINDINGS: Cardiac silhouette is mildly enlarged and unchanged. Mediastinal silhouette is nonsuspicious. Mild pulmonary vascular congestion without pleural effusion or focal consolidation. No pneumothorax. Soft tissue planes and included osseous structures are nonsuspicious. IMPRESSION: Mild cardiomegaly and pulmonary vascular congestion. Electronically Signed   By: Elon Alas M.D.   On: 12/13/2017 16:53    Medications:  Prior to Admission:  Medications Prior to Admission  Medication Sig Dispense Refill Last  Dose  . ALPRAZolam (XANAX) 1 MG tablet Take 1 mg by mouth 4 (four) times daily as needed for anxiety (nerves).   12/12/2017 at Unknown time  . atorvastatin (LIPITOR) 10 MG tablet Take 20 mg by mouth daily.    12/12/2017 at  Unknown time  . brimonidine (ALPHAGAN) 0.2 % ophthalmic solution Place 1 drop into the left eye 3 (three) times daily.     . brimonidine-timolol (COMBIGAN) 0.2-0.5 % ophthalmic solution Place 1 drop into the left eye 3 (three) times daily.      . busPIRone (BUSPAR) 7.5 MG tablet Take 7.5 mg by mouth 2 (two) times daily.     . calcium carbonate (TUMS EX) 750 MG chewable tablet Chew 1-2 tablets by mouth 6 (six) times daily. Patient takes 3 tablets with meals and 2 tablet with snacks three times a day   12/12/2017 at Unknown time  . carvedilol (COREG) 12.5 MG tablet Take 1 tablet (12.5 mg total) by mouth 2 (two) times daily with a meal. 60 tablet 0 12/12/2017 at 9pm  . colchicine 0.6 MG tablet Take 1 tablet (0.6 mg total) by mouth every other day. 16 tablet 12 12/11/2017 at Unknown time  . cyclopentolate (CYCLODRYL,CYCLOGYL) 1 % ophthalmic solution 1 drop 2 (two) times daily.     Marland Kitchen doxycycline (ADOXA) 100 MG tablet Take 100 mg by mouth 2 (two) times daily.     Marland Kitchen gabapentin (NEURONTIN) 300 MG capsule Take 300 mg by mouth 2 (two) times daily.     . hydrALAZINE (APRESOLINE) 25 MG tablet Take 1 tablet (25 mg total) by mouth every 8 (eight) hours. (Patient taking differently: Take 25 mg by mouth 3 (three) times daily. ) 90 tablet 12 12/10/2017 at Unknown time  . HYDROcodone-acetaminophen (NORCO) 10-325 MG tablet Take 1 tablet by mouth every 6 (six) hours as needed for moderate pain. 6 tablet 0 12/11/2017 at Unknown time  . Insulin Glargine (LANTUS SOLOSTAR) 100 UNIT/ML Solostar Pen Inject 10 Units into the skin daily at 10 pm.     . latanoprost (XALATAN) 0.005 % ophthalmic solution Place 1 drop into the left eye at bedtime.     . lidocaine-prilocaine (EMLA) cream Apply 1 application topically as needed (topical anesthesia for hemodialysis if Gebauers and Lidocaine injection are ineffective.). 30 g 0 12/10/2017 at Unknown time  . PARoxetine (PAXIL) 20 MG tablet Take 1 daily. This is to prevent panic attacks (Patient  taking differently: Take 20-40 mg by mouth daily. Take 1 daily. This is to prevent panic attacks) 30 tablet 12 12/10/2017 at Unknown time  . prednisoLONE acetate (PRED FORTE) 1 % ophthalmic suspension Place 1 drop into the left eye 4 (four) times daily.     Marland Kitchen torsemide (DEMADEX) 20 MG tablet Take 20 mg by mouth daily.     Marland Kitchen dicyclomine (BENTYL) 10 MG capsule Take 1 capsule (10 mg total) by mouth 4 (four) times daily -  before meals and at bedtime. (Patient not taking: Reported on 12/14/2017) 120 capsule 3 Not Taking at Unknown time  . loperamide (IMODIUM) 2 MG capsule Take 1 capsule (2 mg total) by mouth 4 (four) times daily as needed for diarrhea or loose stools. (Patient not taking: Reported on 12/14/2017) 12 capsule 0 Not Taking at Unknown time  . risperiDONE (RISPERDAL) 0.5 MG tablet Take 1 tablet (0.5 mg total) by mouth 2 (two) times daily. (Patient not taking: Reported on 12/14/2017)   Not Taking at Unknown time   Scheduled: . atorvastatin  10  mg Oral Daily  . brimonidine-timolol  1 drop Left Eye Q12H  . carvedilol  12.5 mg Oral BID WC  . feeding supplement (NEPRO CARB STEADY)  237 mL Oral BID BM  . hydrALAZINE  25 mg Oral BID  . insulin aspart  0-9 Units Subcutaneous TID WC  . latanoprost  1 drop Left Eye QHS  . PARoxetine  20-40 mg Oral Daily  . risperiDONE  0.5 mg Oral BID  . rOPINIRole  1 mg Oral QHS  . sodium chloride flush  3 mL Intravenous Q12H   Continuous: . sodium chloride    . sodium chloride    . sodium chloride     HXT:AVWPVX chloride, sodium chloride, sodium chloride, heparin, heparin, HYDROcodone-acetaminophen, lidocaine (PF), lidocaine-prilocaine, pentafluoroprop-tetrafluoroeth, sodium chloride flush  Assesment: She came to the hospital because of not having finished her dialysis and she was somewhat confused.  She is better now.  She had dialysis yesterday.  She does not need dialysis today.  At baseline she has bipolar 1 disorder which may have caused some of the  problems with confusion  I think she is ready for discharge Principal Problem:   Dialysis patient [facility]) Active Problems:   Essential hypertension   Bipolar 1 disorder ([place])   Noncompliance with medication regimen   Chronic diarrhea    Plan: Discharge home today    LOS: 0 days   Donnesha Karg L 12/15/2017, 8:39 AM

## 2017-12-15 NOTE — Progress Notes (Signed)
Pt's IV catheter removed and intact. Pt's IV site clean dry and intact. Discharge instructions including medications and follow up appointments were reviewed and discussed with patient. All questions were answered and no further questions at this time. Pt in stable condition and in no acute distress at time of discharge. Pt escorted by nurse tech 

## 2017-12-15 NOTE — Discharge Summary (Signed)
Physician Discharge Summary  Patient ID: Martha [name] MRN: [id number] DOB/AGE: 1966/05/20 52 y.o. Primary Care Physician:Hilde Churchman, Percell Miller, MD Admit date: 12/13/2017 Discharge date: 12/15/2017    Discharge Diagnoses:   Principal Problem:   Dialysis patient [facility]) Active Problems:   Essential hypertension   Sleep apnea   Diabetes mellitus, type 2 ([place])   Bipolar 1 disorder ([place])   Noncompliance with medication regimen   Metabolic encephalopathy   Chronic diarrhea   ESRD (end stage renal disease) on dialysis ([place])   Allergies as of 12/15/2017      Reactions   Codeine Nausea And Vomiting   Tape Other (See Comments)   Plastic Tape      Medication List    TAKE these medications   ALPRAZolam 1 MG tablet Commonly known as:  XANAX Take 1 mg by mouth 4 (four) times daily as needed for anxiety (nerves).   atorvastatin 10 MG tablet Commonly known as:  LIPITOR Take 20 mg by mouth daily.   brimonidine 0.2 % ophthalmic solution Commonly known as:  ALPHAGAN Place 1 drop into the left eye 3 (three) times daily.   busPIRone 7.5 MG tablet Commonly known as:  BUSPAR Take 7.5 mg by mouth 2 (two) times daily.   calcium carbonate 750 MG chewable tablet Commonly known as:  TUMS EX Chew 1-2 tablets by mouth 6 (six) times daily. Patient takes 3 tablets with meals and 2 tablet with snacks three times a day   carvedilol 12.5 MG tablet Commonly known as:  COREG Take 1 tablet (12.5 mg total) by mouth 2 (two) times daily with a meal.   colchicine 0.6 MG tablet Take 1 tablet (0.6 mg total) by mouth every other day.   COMBIGAN 0.2-0.5 % ophthalmic solution Generic drug:  brimonidine-timolol Place 1 drop into the left eye 3 (three) times daily.   cyclopentolate 1 % ophthalmic solution Commonly known as:  CYCLODRYL,CYCLOGYL 1 drop 2 (two) times daily.   dicyclomine 10 MG capsule Commonly known as:  BENTYL Take 1 capsule (10 mg total) by mouth 4 (four) times daily -  before meals and  at bedtime.   doxycycline 100 MG tablet Commonly known as:  ADOXA Take 100 mg by mouth 2 (two) times daily.   gabapentin 300 MG capsule Commonly known as:  NEURONTIN Take 300 mg by mouth 2 (two) times daily.   hydrALAZINE 25 MG tablet Commonly known as:  APRESOLINE Take 1 tablet (25 mg total) by mouth every 8 (eight) hours. What changed:  when to take this   HYDROcodone-acetaminophen 10-325 MG tablet Commonly known as:  NORCO Take 1 tablet by mouth every 6 (six) hours as needed for moderate pain.   LANTUS SOLOSTAR 100 UNIT/ML Solostar Pen Generic drug:  Insulin Glargine Inject 10 Units into the skin daily at 10 pm.   latanoprost 0.005 % ophthalmic solution Commonly known as:  XALATAN Place 1 drop into the left eye at bedtime.   lidocaine-prilocaine cream Commonly known as:  EMLA Apply 1 application topically as needed (topical anesthesia for hemodialysis if Gebauers and Lidocaine injection are ineffective.).   loperamide 2 MG capsule Commonly known as:  IMODIUM Take 1 capsule (2 mg total) by mouth 4 (four) times daily as needed for diarrhea or loose stools.   PARoxetine 20 MG tablet Commonly known as:  PAXIL Take 1 daily. This is to prevent panic attacks What changed:    how much to take  how to take this  when to take this  additional instructions   prednisoLONE acetate 1 % ophthalmic suspension Commonly known as:  PRED FORTE Place 1 drop into the left eye 4 (four) times daily.   risperiDONE 0.5 MG tablet Commonly known as:  RISPERDAL Take 1 tablet (0.5 mg total) by mouth 2 (two) times daily.   torsemide 20 MG tablet Commonly known as:  DEMADEX Take 20 mg by mouth daily.       Discharged Condition: Improved    Consults: Nephrology  Significant Diagnostic Studies: Ct Head Wo Contrast  Result Date: 12/13/2017 CLINICAL DATA:  Altered level of consciousness EXAM: CT HEAD WITHOUT CONTRAST TECHNIQUE: Contiguous axial images were obtained from the  base of the skull through the vertex without intravenous contrast. COMPARISON:  None. FINDINGS: Brain: No acute intracranial abnormality. Specifically, no hemorrhage, hydrocephalus, mass lesion, acute infarction, or significant intracranial injury. Vascular: No hyperdense vessel or unexpected calcification. Skull: No acute calvarial abnormality. Sinuses/Orbits: Visualized paranasal sinuses and mastoids clear. Orbital soft tissues unremarkable. Other: None IMPRESSION: Normal study. Electronically Signed   By: Rolm Baptise M.D.   On: 12/13/2017 16:37   Dg Chest Port 1 View  Result Date: 12/13/2017 CLINICAL DATA:  Recurrent lethargy, status post dialysis yesterday. LEFT periorbital bruising, eye surgery 1 week ago and January 2019. EXAM: PORTABLE CHEST 1 VIEW COMPARISON:  Chest radiograph April 20, 2017 FINDINGS: Cardiac silhouette is mildly enlarged and unchanged. Mediastinal silhouette is nonsuspicious. Mild pulmonary vascular congestion without pleural effusion or focal consolidation. No pneumothorax. Soft tissue planes and included osseous structures are nonsuspicious. IMPRESSION: Mild cardiomegaly and pulmonary vascular congestion. Electronically Signed   By: Elon Alas M.D.   On: 12/13/2017 16:53    Lab Results: Basic Metabolic Panel: Recent Labs    12/13/17 1610 12/13/17 1624 12/14/17 0459  NA 141 142 142  K 4.9 4.6 4.0  CL 103 104 103  CO2 28  --  26  GLUCOSE 154* 150* 169*  BUN 57* 49* 60*  CREATININE 7.03* 6.70* 7.34*  CALCIUM 9.6  --  9.4   Liver Function Tests: Recent Labs    12/13/17 1610  AST 13*  ALT 18  ALKPHOS 106  BILITOT 0.8  PROT 7.7  ALBUMIN 3.9     CBC: Recent Labs    12/13/17 1610 12/13/17 1624 12/14/17 0459  WBC 6.6  --  6.0  NEUTROABS 3.5  --   --   HGB 12.6 13.6 12.2  HCT 40.8 40.0 39.0  MCV 94.9  --  94.2  PLT 166  --  160    No results found for this or any previous visit (from the past 240 hour(s)).   Hospital Course: She came  to the hospital because she had not been able to finish her dialysis and she had some confusion.  She is been having trouble with her left eye and went to the eye doctor and that is why she had to cut her dialysis short.  She came to the emergency department and did not need emergent dialysis but had dialysis done on the 20th and she is back to her baseline.  She is ready for discharge.  Discharge Exam: Blood pressure (!) 169/89, pulse 70, temperature 98.9 F (37.2 C), temperature source Oral, resp. rate 18, height 5\' 7"  (1.702 m), weight (!) 142.9 kg (315 lb 0.6 oz), SpO2 91 %. She is awake and alert.  Her chest is clear.  Her heart is regular.  Disposition: Home resume regular outpatient dialysis tomorrow      Signed:  Elesa Garman L   12/15/2017, 8:43 AM

## 2017-12-15 NOTE — Progress Notes (Signed)
Subjective: Interval History: has complaints headache.  She denies any difficulty breathing.  No nausea or vomiting..  Objective: Vital signs in last 24 hours: Temp:  [97.8 F (36.6 C)-98.9 F (37.2 C)] 98.9 F (37.2 C) (05/21 0606) Pulse Rate:  [57-73] 73 (05/21 0606) Resp:  [14-18] 14 (05/21 0606) BP: (90-177)/(48-91) 140/74 (05/21 0606) SpO2:  [94 %-96 %] 96 % (05/21 0606) Weight:  [142.9 kg (315 lb 0.6 oz)] 142.9 kg (315 lb 0.6 oz) (05/20 1845) Weight change: 6.821 kg (15 lb 0.6 oz)  Intake/Output from previous day: 05/20 0701 - 05/21 0700 In: 540 [P.O.:540] Out: 1898  Intake/Output this shift: No intake/output data recorded.  General appearance: alert, cooperative and no distress Resp: clear to auscultation bilaterally Cardio: regular rate and rhythm Extremities: No edema  Lab Results: Recent Labs    12/13/17 1610 12/13/17 1624 12/14/17 0459  WBC 6.6  --  6.0  HGB 12.6 13.6 12.2  HCT 40.8 40.0 39.0  PLT 166  --  160   BMET:  Recent Labs    12/13/17 1610 12/13/17 1624 12/14/17 0459  NA 141 142 142  K 4.9 4.6 4.0  CL 103 104 103  CO2 28  --  26  GLUCOSE 154* 150* 169*  BUN 57* 49* 60*  CREATININE 7.03* 6.70* 7.34*  CALCIUM 9.6  --  9.4   No results for input(s): PTH in the last 72 hours. Iron Studies: No results for input(s): IRON, TIBC, TRANSFERRIN, FERRITIN in the last 72 hours.  Studies/Results: Ct Head Wo Contrast  Result Date: 12/13/2017 CLINICAL DATA:  Altered level of consciousness EXAM: CT HEAD WITHOUT CONTRAST TECHNIQUE: Contiguous axial images were obtained from the base of the skull through the vertex without intravenous contrast. COMPARISON:  None. FINDINGS: Brain: No acute intracranial abnormality. Specifically, no hemorrhage, hydrocephalus, mass lesion, acute infarction, or significant intracranial injury. Vascular: No hyperdense vessel or unexpected calcification. Skull: No acute calvarial abnormality. Sinuses/Orbits: Visualized paranasal  sinuses and mastoids clear. Orbital soft tissues unremarkable. Other: None IMPRESSION: Normal study. Electronically Signed   By: Rolm Baptise M.D.   On: 12/13/2017 16:37   Dg Chest Port 1 View  Result Date: 12/13/2017 CLINICAL DATA:  Recurrent lethargy, status post dialysis yesterday. LEFT periorbital bruising, eye surgery 1 week ago and January 2019. EXAM: PORTABLE CHEST 1 VIEW COMPARISON:  Chest radiograph April 20, 2017 FINDINGS: Cardiac silhouette is mildly enlarged and unchanged. Mediastinal silhouette is nonsuspicious. Mild pulmonary vascular congestion without pleural effusion or focal consolidation. No pneumothorax. Soft tissue planes and included osseous structures are nonsuspicious. IMPRESSION: Mild cardiomegaly and pulmonary vascular congestion. Electronically Signed   By: Elon Alas M.D.   On: 12/13/2017 16:53    I have reviewed the patient's current medications.  Assessment/Plan: 1] end-stage renal disease: She is status post hemodialysis yesterday.  Presently she does not have any uremic signs and symptoms. 2] hypertension: Her blood pressure is reasonably controlled 3] fluid management: She is status post hemodialysis yesterday.  We are able to remove about 1.9 L.  Presently she denies any difficulty breathing. 4] anemia: Her hemoglobin is above our target goal and Epogen is on hold 5] bone and mineral disorder: Her calcium and phosphorus is a range.  6] history of altered mental status: At this moment patient seems to have returned to her baseline.  Etiology for her problem is not clear. Plan: 1]Patient does not require dialysis today 2] we will dialyze her tomorrow 3] we will remove about 2 L if systolic  blood pressure remains above 90  LOS: 0 days   Regan Llorente S 12/15/2017,8:25 AM

## 2017-12-16 LAB — HEPATITIS B SURFACE ANTIGEN: Hepatitis B Surface Ag: NEGATIVE

## 2018-01-06 ENCOUNTER — Emergency Department (HOSPITAL_COMMUNITY): Payer: Medicaid Other

## 2018-01-06 ENCOUNTER — Other Ambulatory Visit: Payer: Self-pay

## 2018-01-06 ENCOUNTER — Encounter (HOSPITAL_COMMUNITY): Payer: Self-pay | Admitting: Emergency Medicine

## 2018-01-06 ENCOUNTER — Emergency Department (HOSPITAL_COMMUNITY)
Admission: EM | Admit: 2018-01-06 | Discharge: 2018-01-06 | Disposition: A | Discharge status: Home / Self Care | Payer: Medicaid Other | Attending: Emergency Medicine | Admitting: Emergency Medicine

## 2018-01-06 DIAGNOSIS — S4991XA Unspecified injury of right shoulder and upper arm, initial encounter: Secondary | ICD-10-CM | POA: Insufficient documentation

## 2018-01-06 DIAGNOSIS — N186 End stage renal disease: Secondary | ICD-10-CM | POA: Insufficient documentation

## 2018-01-06 DIAGNOSIS — Z992 Dependence on renal dialysis: Secondary | ICD-10-CM | POA: Diagnosis not present

## 2018-01-06 DIAGNOSIS — Z79899 Other long term (current) drug therapy: Secondary | ICD-10-CM | POA: Insufficient documentation

## 2018-01-06 DIAGNOSIS — Y929 Unspecified place or not applicable: Secondary | ICD-10-CM | POA: Insufficient documentation

## 2018-01-06 DIAGNOSIS — S8991XA Unspecified injury of right lower leg, initial encounter: Secondary | ICD-10-CM | POA: Diagnosis present

## 2018-01-06 DIAGNOSIS — S89291A Other physeal fracture of upper end of right fibula, initial encounter for closed fracture: Secondary | ICD-10-CM | POA: Diagnosis not present

## 2018-01-06 DIAGNOSIS — S82831A Other fracture of upper and lower end of right fibula, initial encounter for closed fracture: Secondary | ICD-10-CM

## 2018-01-06 DIAGNOSIS — Z794 Long term (current) use of insulin: Secondary | ICD-10-CM | POA: Insufficient documentation

## 2018-01-06 DIAGNOSIS — I509 Heart failure, unspecified: Secondary | ICD-10-CM | POA: Diagnosis not present

## 2018-01-06 DIAGNOSIS — W01198A Fall on same level from slipping, tripping and stumbling with subsequent striking against other object, initial encounter: Secondary | ICD-10-CM | POA: Insufficient documentation

## 2018-01-06 DIAGNOSIS — Y999 Unspecified external cause status: Secondary | ICD-10-CM | POA: Insufficient documentation

## 2018-01-06 DIAGNOSIS — E1165 Type 2 diabetes mellitus with hyperglycemia: Secondary | ICD-10-CM | POA: Diagnosis not present

## 2018-01-06 DIAGNOSIS — Y939 Activity, unspecified: Secondary | ICD-10-CM | POA: Diagnosis not present

## 2018-01-06 DIAGNOSIS — Z87891 Personal history of nicotine dependence: Secondary | ICD-10-CM | POA: Insufficient documentation

## 2018-01-06 DIAGNOSIS — I132 Hypertensive heart and chronic kidney disease with heart failure and with stage 5 chronic kidney disease, or end stage renal disease: Secondary | ICD-10-CM | POA: Diagnosis not present

## 2018-01-06 LAB — CBG MONITORING, ED: GLUCOSE-CAPILLARY: 182 mg/dL — AB (ref 65–99)

## 2018-01-06 MED ORDER — HYDROMORPHONE HCL 1 MG/ML IJ SOLN
0.5000 mg | Freq: Once | INTRAMUSCULAR | Status: AC
  Administered 2018-01-06: 0.5 mg via INTRAMUSCULAR
  Filled 2018-01-06: qty 1

## 2018-01-06 NOTE — Discharge Instructions (Addendum)
Continue your pain medications, apply ice to help with the pain.  Try using a cane to help you with walking

## 2018-01-06 NOTE — ED Triage Notes (Signed)
Pt tripped and fell 9 days ago. Pt states still hurting to right lower leg and right upper arm. Nad. Pt denies hitting head.

## 2018-01-06 NOTE — ED Provider Notes (Signed)
[facility] EMERGENCY DEPARTMENT Provider Note   CSN: [id number] Arrival date & time: 01/06/18  1441     History   Chief Complaint Chief Complaint  Patient presents with  . Fall    HPI Martha [name] is a 52 y.o. female.  HPI Patient states she tripped and fell 9 days ago.  Since that time she has had persistent pain in her right shoulder as well as her right lower leg.  Initially she mentioned it to her dialysis doctors told her as long as she was not having too much trouble walking it was most likely bruised.  Patient mentioned today that she was still having pain so they suggested she get it checked out.  Pain is primarily in the right lower leg from the knee down to the ankle but she is also having pain in her right shoulder.  Patient denies any headache.  No nausea vomiting.  No numbness or weakness.  No chest pain or shortness of breath. Past Medical History:  Diagnosis Date  . Acid reflux    takes Tums  . Anemia   . Arthritis   . Bipolar 1 disorder ([place])   . Blindness of left eye   . Cervical radiculopathy   . CHF (congestive heart failure) ([place])   . Chronic kidney disease    Stage 5- 01/25/17  . COPD (chronic obstructive pulmonary disease) ([place]) 2014   bronchitis  . Degenerative disc disease, thoracic   . Depression   . Diabetes mellitus    Type II  . Diabetic retinopathy ([place])   . Hypertension   . Noncompliance with medication regimen   . Noncompliance with medication regimen   . Obesity (BMI 30-39.9)   . OSA (obstructive sleep apnea)    cpap  . Panic attack   . RLS (restless legs syndrome)     Patient Active Problem List   Diagnosis Date Noted  . Dialysis patient ([place]) 12/13/2017  . ESRD (end stage renal disease) on dialysis ([place])   . Chronic diarrhea 10/08/2017  . Renal failure (ARF), acute on chronic (HCC) 04/22/2017  . Anasarca associated with disorder of kidney 04/20/2017  . Metabolic encephalopathy 13/02/6577  . Acute pulmonary edema ([name])  04/05/2017  . Acute psychosis ([place]) 12/01/2016  . Protein-calorie malnutrition, severe 11/29/2016  . Confusion 11/22/2016  . HTN (hypertension), malignant 11/20/2016  . Diabetes mellitus 11/20/2016  . Bipolar 1 disorder ([place]) 11/20/2016  . Noncompliance with medication regimen 11/20/2016  . Panic attack 11/20/2016  . CKD stage 5 secondary to hypertension ([place]) 11/15/2016  . AKI (acute kidney injury) ([place]) 11/15/2016  . Chest pain 10/27/2016  . CKD (chronic kidney disease) stage 5, GFR less than 15 ml/min (HCC) 10/27/2016  . Uncontrolled type 2 diabetes mellitus with hyperglycemia, without long-term current use of insulin ([place]) 10/27/2016  . Sensory disturbance 10/27/2016  . Left hemiparesis ([place]) 10/27/2016  . Acute on chronic renal failure ([place]) 05/25/2016  . Abdominal pain 04/26/2014  . Cholelithiasis 04/26/2014  . ARF (acute renal failure) ([place]) 04/26/2014  . Dehydration 04/26/2014  . Hyponatremia 04/26/2014  . Gallstones 04/06/2014  . Uncontrolled diabetes mellitus ([place]) 10/09/2013  . Breast abscess 05/30/2013  . Diabetes mellitus, type 2 ([place]) 05/30/2013  . Hyperglycemia 05/30/2013  . Internal hemorrhoids with other complication 46/96/2952  . Umbilical hernia without mention of obstruction or gangrene 04/15/2013  . Anemia 04/15/2013  . DM 05/13/2010  . Hyperlipidemia 05/13/2010  . Morbid obesity ([place]) 05/13/2010  . Depression 05/13/2010  . RESTLESS  LEG SYNDROME 05/13/2010  . Essential hypertension 05/13/2010  . GERD 05/13/2010  . RECTAL BLEEDING 05/13/2010  . Sleep apnea 05/13/2010  . Dysphagia, oropharyngeal phase 05/13/2010    Past Surgical History:  Procedure Laterality Date  . AV FISTULA PLACEMENT Right 01/27/2017   Procedure: ARTERIOVENOUS (AV) FISTULA CREATION-RIGHT ARM;  Surgeon: Elam Dutch, MD;  Location: [facility];  Service: Vascular;  Laterality: Right;  . BIOPSY N/A 05/17/2013   Procedure: BIOPSY;  Surgeon: [name], MD;  Location: [facility];   Service: Endoscopy;  Laterality: N/A;  . CHOLECYSTECTOMY N/A 04/27/2014   Procedure: LAPAROSCOPIC CHOLECYSTECTOMY;  Surgeon: [name], MD;  Location: [facility];  Service: General;  Laterality: N/A;  . COLONOSCOPY  06/06/2010   XBD:ZHGDJM bleeding secondary to internal hemorrhoids but incomplete evaluation secondary to poor right colon prep/small rectal and sigmoid colon polyps (hyperplastic). PROPOFOL  . COLONOSCOPY  May 2013   Dr. Gilliam/[facility]: 5 mm a descending colon polyp, hyperplastic. Adequate bowel prep.  . ENDOMETRIAL ABLATION    . ESOPHAGOGASTRODUODENOSCOPY  06/06/2010   EQA:STMHDQQ erythema and edema of body of stomach, with sessile polypoid lesions. bx benign. no h.pylori  . ESOPHAGOGASTRODUODENOSCOPY (EGD) WITH PROPOFOL N/A 05/17/2013   Dr. Oneida Alar: normal esophagus, moderate nodular gastritis, negative path, empiric Savary dilation  . EYE SURGERY  07/2017   sx for glaucoma  . FLEXIBLE SIGMOIDOSCOPY N/A 05/17/2013   Dr. Oneida Alar: moderate sized internal hemorrhoids  . HEMORRHOID BANDING N/A 05/17/2013   Procedure: HEMORRHOID BANDING;  Surgeon: [name], MD;  Location: [facility];  Service: Endoscopy;  Laterality: N/A;  2 bands placed  . INCISION AND DRAINAGE ABSCESS Left 10/11/2013   Procedure: INCISION AND DRAINAGE AND DEBRIDEMENT LEFT BREAST  ABSCESS;  Surgeon: [name], MD;  Location: [facility];  Service: General;  Laterality: Left;  . IRRIGATION AND DEBRIDEMENT ABSCESS Right 06/01/2013   Procedure: INCISION AND DRAINAGE AND DEBRIDEMENT ABSCESS RIGHT BREAST;  Surgeon: [name], MD;  Location: [facility];  Service: General;  Laterality: Right;  . SAVORY DILATION N/A 05/17/2013   Procedure: SAVORY DILATION;  Surgeon: [name], MD;  Location: [facility];  Service: Endoscopy;  Laterality: N/A;  14/15/16  . TUBAL LIGATION       OB History    Gravida  2   Para  2   Term  1   Preterm  1   AB      Living  2     SAB      TAB      Ectopic       Multiple      Live Births               Home Medications    Prior to Admission medications   Medication Sig Start Date End Date Taking? Authorizing Provider  ALPRAZolam Duanne Moron) 1 MG tablet Take 1 mg by mouth 4 (four) times daily as needed for anxiety (nerves).    [provider]  atorvastatin (LIPITOR) 10 MG tablet Take 20 mg by mouth daily.     [provider]  brimonidine (ALPHAGAN) 0.2 % ophthalmic solution Place 1 drop into the left eye 3 (three) times daily.    [provider]  brimonidine-timolol (COMBIGAN) 0.2-0.5 % ophthalmic solution Place 1 drop into the left eye 3 (three) times daily.     [provider]  busPIRone (BUSPAR) 7.5 MG tablet Take 7.5 mg by mouth 2 (two) times daily.    [provider]  calcium carbonate (TUMS EX) 750 MG chewable tablet Chew 1-2 tablets by mouth 6 (six) times daily. Patient takes 3 tablets with meals and 2 tablet with snacks three times a day    [provider]  carvedilol (COREG) 12.5 MG tablet Take 1 tablet (12.5 mg total) by mouth 2 (two) times daily with a meal. 05/26/16   Cherene Altes, MD  colchicine 0.6 MG tablet Take 1 tablet (0.6 mg total) by mouth every other day. 11/19/16   Sinda Du, MD  cyclopentolate (CYCLODRYL,CYCLOGYL) 1 % ophthalmic solution 1 drop 2 (two) times daily.    [provider]  dicyclomine (BENTYL) 10 MG capsule Take 1 capsule (10 mg total) by mouth 4 (four) times daily -  before meals and at bedtime. Patient not taking: Reported on 12/14/2017 10/08/17   Annitta Needs, NP  doxycycline (ADOXA) 100 MG tablet Take 100 mg by mouth 2 (two) times daily.    [provider]  gabapentin (NEURONTIN) 300 MG capsule Take 300 mg by mouth 2 (two) times daily.    [provider]  hydrALAZINE (APRESOLINE) 25 MG tablet Take 1 tablet (25 mg total) by mouth every 8 (eight) hours. Patient taking differently: Take 25 mg by mouth 3 (three) times daily.   10/29/16   Sinda Du, MD  HYDROcodone-acetaminophen (NORCO) 10-325 MG tablet Take 1 tablet by mouth every 6 (six) hours as needed for moderate pain. 01/27/17   Rhyne, Hulen Shouts, PA-C  Insulin Glargine (LANTUS SOLOSTAR) 100 UNIT/ML Solostar Pen Inject 10 Units into the skin daily at 10 pm.    [provider]  latanoprost (XALATAN) 0.005 % ophthalmic solution Place 1 drop into the left eye at bedtime.    [provider]  lidocaine-prilocaine (EMLA) cream Apply 1 application topically as needed (topical anesthesia for hemodialysis if Gebauers and Lidocaine injection are ineffective.). 04/25/17   Johnson, Clanford L, MD  loperamide (IMODIUM) 2 MG capsule Take 1 capsule (2 mg total) by mouth 4 (four) times daily as needed for diarrhea or loose stools. Patient not taking: Reported on 12/14/2017 10/23/16   Evalee Jefferson, PA-C  PARoxetine (PAXIL) 20 MG tablet Take 1 daily. This is to prevent panic attacks Patient taking differently: Take 20-40 mg by mouth daily. Take 1 daily. This is to prevent panic attacks 10/28/16   Sinda Du, MD  prednisoLONE acetate (PRED FORTE) 1 % ophthalmic suspension Place 1 drop into the left eye 4 (four) times daily.    [provider]  risperiDONE (RISPERDAL) 0.5 MG tablet Take 1 tablet (0.5 mg total) by mouth 2 (two) times daily. Patient not taking: Reported on 12/14/2017 12/01/16   Sinda Du, MD  torsemide (DEMADEX) 20 MG tablet Take 20 mg by mouth daily.    [provider]    Family History Family History  Adopted: Yes  Family history unknown: Yes    Social History Social History   Tobacco Use  . Smoking status: Former Smoker    Packs/day: 1.50    Years: 25.00    Pack years: 37.50    Types: Cigarettes    Last attempt to quit: 10/23/2007    Years since quitting: 10.2  . Smokeless tobacco: Never Used  . Tobacco comment: Quit x 10 years ago  Substance Use Topics  . Alcohol use: No  . Drug use: No     Allergies     Codeine and Tape   Review of Systems Review of Systems  All other systems  reviewed and are negative.    Physical Exam Updated Vital Signs BP 128/76 (BP Location: Left Arm)   Pulse 75   Temp 98.2 F (36.8 C) (Oral)   Resp 18   Ht 1.702 m (5\' 7" )   Wt (!) 141.2 kg (311 lb 4.6 oz)   SpO2 94%   BMI 48.75 kg/m   Physical Exam  Constitutional: No distress.  HENT:  Head: Normocephalic and atraumatic.  Right Ear: External ear normal.  Left Ear: External ear normal.  Eyes: Conjunctivae are normal. Right eye exhibits no discharge. Left eye exhibits no discharge. No scleral icterus.  Neck: Neck supple. No tracheal deviation present.  Cardiovascular: Normal rate, regular rhythm and intact distal pulses.  Pulmonary/Chest: Effort normal and breath sounds normal. No stridor. No respiratory distress. She has no wheezes. She has no rales.  Abdominal: Soft. Bowel sounds are normal. She exhibits no distension. There is no tenderness. There is no rebound and no guarding.  Musculoskeletal: She exhibits no edema.       Right shoulder: She exhibits tenderness. She exhibits no bony tenderness and no swelling.       Left shoulder: She exhibits no tenderness, no bony tenderness and no swelling.       Right wrist: She exhibits no tenderness, no bony tenderness and no swelling.       Left wrist: She exhibits no tenderness, no bony tenderness and no swelling.       Right hip: She exhibits normal range of motion, no tenderness, no bony tenderness and no swelling.       Left hip: She exhibits normal range of motion, no tenderness and no bony tenderness.       Right knee: She exhibits no swelling and no effusion. Tenderness found.       Right ankle: She exhibits no swelling. No tenderness.       Left ankle: She exhibits no swelling. No tenderness.       Cervical back: She exhibits no tenderness, no bony tenderness and no swelling.       Thoracic back: She exhibits no tenderness, no bony tenderness and  no swelling.       Lumbar back: She exhibits no tenderness, no bony tenderness and no swelling.       Right lower leg: She exhibits bony tenderness. She exhibits no swelling, no edema and no deformity.  Neurological: She is alert. She has normal strength. No cranial nerve deficit (no facial droop, extraocular movements intact, no slurred speech) or sensory deficit. She exhibits normal muscle tone. She displays no seizure activity. Coordination normal.  Skin: Skin is warm and dry. No rash noted. She is not diaphoretic.  Psychiatric: She has a normal mood and affect.  Nursing note and vitals reviewed.    ED Treatments / Results  Labs (all labs ordered are listed, but only abnormal results are displayed) Labs Reviewed  CBG MONITORING, ED - Abnormal; Notable for the following components:      Result Value   Glucose-Capillary 182 (*)    All other components within normal limits    EKG None  Radiology Dg Shoulder Right  Result Date: 01/06/2018 CLINICAL DATA:  Fall.  Shoulder pain EXAM: RIGHT SHOULDER - 2+ VIEW COMPARISON:  None. FINDINGS: There is no evidence of fracture or dislocation. There is no evidence of arthropathy or other focal bone abnormality. Soft tissues are unremarkable. IMPRESSION: Negative. Electronically Signed   By: Franchot Gallo M.D.   On: 01/06/2018 16:20  Dg Tibia/fibula Right  Result Date: 01/06/2018 CLINICAL DATA:  Fall 1 week ago with persistent right leg pain, initial encounter EXAM: RIGHT TIBIA AND FIBULA - 2 VIEW COMPARISON:  None. FINDINGS: No acute fracture or dislocation is noted. No soft tissue changes are seen. Calcaneal spurs are noted. IMPRESSION: No acute abnormality noted. Electronically Signed   By: Inez Catalina M.D.   On: 01/06/2018 16:18   Dg Knee Complete 4 Views Right  Result Date: 01/06/2018 CLINICAL DATA:  Status post fall. EXAM: RIGHT KNEE - COMPLETE 4+ VIEW COMPARISON:  None. FINDINGS: Nondisplaced comminuted fracture of proximal fibular  metaphysis without displacement or angulation. No other acute fracture or dislocation. Mild tricompartmental osteoarthritis of the right knee. No joint effusion. IMPRESSION: 1. Nondisplaced comminuted fracture of the proximal fibular metaphysis. Electronically Signed   By: Kathreen Devoid   On: 01/06/2018 16:17    Procedures Procedures (including critical care time)  Medications Ordered in ED Medications  HYDROmorphone (DILAUDID) injection 0.5 mg (has no administration in time range)     Initial Impression / Assessment and Plan / ED Course  I have reviewed the triage vital signs and the nursing notes.  Pertinent labs & imaging results that were available during my care of the patient were reviewed by me and considered in my medical decision making (see chart for details).  Clinical Course as of Jan 07 1644  Wed Jan 06, 2018  1644 I offered crutches to the patient.  Patient states she does not need any.  X-ray results reviewed with the patient.  Patient has a proximal fibula fracture.   [JK]    Clinical Course User Index [JK] Dorie Rank, MD    Patient presented to the emergency room for evaluation after a fall 9 days ago.  X-rays demonstrate a proximal fibula fracture.  Patient can continue to bear weight on this injury.  I will have her follow-up with orthopedics.  Continue your home pain medications.  Final Clinical Impressions(s) / ED Diagnoses   Final diagnoses:  Closed fracture of proximal end of right fibula, unspecified fracture morphology, initial encounter    ED Discharge Orders    None       Dorie Rank, MD 01/06/18 1645

## 2018-01-21 NOTE — H&P (Signed)
Subjective:    Martha [name] is a 52 y.o. female who presents for evaluation of blind painful LEFT EYE. The pain is described as 8-9/10. Onset was several weeks ago. Symptoms have been unchanged since.   Review of Systems Pertinent items are noted in HPI.    Objective:   There were no vitals taken for this visit.  General:  alert, cooperative and appears stated age Skin:  normal Eyes: positive findings: eyelids/periorbital: ptosis on the left, conjunctiva: 3+ injection, sclera wnl  and pupillary abnormality: APD OS OD WNL  Mouth: MMM no lesions Lymph Nodes:  Cervical, supraclavicular, and axillary nodes normal. Lungs:  clear to auscultation bilaterally Heart:  regular rate and rhythm, S1, S2 normal, no murmur, click, rub or gallop Abdomen: soft, non-tender; bowel sounds normal; no masses,  no organomegaly CVA:  absent Genitourinary: defer exam Extremities:  extremities normal, atraumatic, no cyanosis or edema Neurologic:  negative Psychiatric:  normal mood, behavior, speech, dress, and thought processes    Assessment: Blind and Painful LEFT Eye   Plan: Enucleation with Placement of Implant LEFT Eye  1. Discussed the risk of surgery,  and the risks of general anesthetic including MI, CVA, sudden death or even reaction to anesthetic medications. The patient understands the risks, any and all questions were answered to the patient's satisfaction. 2. Follow up: 1 day.  Date of Surgery Update (To be completed by Attending Surgeon day of surgery.)

## 2018-01-25 HISTORY — PX: OTHER SURGICAL HISTORY: SHX169

## 2018-02-02 ENCOUNTER — Encounter (HOSPITAL_COMMUNITY): Payer: Self-pay

## 2018-02-02 NOTE — Progress Notes (Signed)
Spoke to patient on the phone and gave instructions for surgery on 02/04/18. Instructed patient to check blood sugar in the AM and cover with half her dose of insulin the morning of surgery. Patient also was informed to take her buspar, coreg, neurontin, paxil, risperdal and xanax and norco as needed. Patient voiced understanding.

## 2018-02-04 ENCOUNTER — Ambulatory Visit (HOSPITAL_COMMUNITY): Payer: Medicaid Other | Admitting: Certified Registered"

## 2018-02-04 ENCOUNTER — Encounter (HOSPITAL_COMMUNITY): Payer: Self-pay | Admitting: *Deleted

## 2018-02-04 ENCOUNTER — Encounter (HOSPITAL_COMMUNITY)
Admission: RE | Disposition: A | Discharge status: Home / Self Care | Payer: Self-pay | Source: Ambulatory Visit | Attending: Oculoplastics Ophthalmology

## 2018-02-04 ENCOUNTER — Other Ambulatory Visit: Payer: Self-pay

## 2018-02-04 ENCOUNTER — Observation Stay (HOSPITAL_COMMUNITY)
Admission: RE | Admit: 2018-02-04 | Discharge: 2018-02-05 | Disposition: A | Discharge status: Home / Self Care | Payer: Medicaid Other | Source: Ambulatory Visit | Attending: Oculoplastics Ophthalmology | Admitting: Oculoplastics Ophthalmology

## 2018-02-04 DIAGNOSIS — I509 Heart failure, unspecified: Secondary | ICD-10-CM | POA: Insufficient documentation

## 2018-02-04 DIAGNOSIS — F410 Panic disorder [episodic paroxysmal anxiety] without agoraphobia: Secondary | ICD-10-CM | POA: Diagnosis not present

## 2018-02-04 DIAGNOSIS — Z9989 Dependence on other enabling machines and devices: Secondary | ICD-10-CM | POA: Diagnosis not present

## 2018-02-04 DIAGNOSIS — Z885 Allergy status to narcotic agent status: Secondary | ICD-10-CM | POA: Insufficient documentation

## 2018-02-04 DIAGNOSIS — H5789 Other specified disorders of eye and adnexa: Secondary | ICD-10-CM | POA: Insufficient documentation

## 2018-02-04 DIAGNOSIS — Z992 Dependence on renal dialysis: Secondary | ICD-10-CM | POA: Insufficient documentation

## 2018-02-04 DIAGNOSIS — Z888 Allergy status to other drugs, medicaments and biological substances status: Secondary | ICD-10-CM | POA: Insufficient documentation

## 2018-02-04 DIAGNOSIS — I130 Hypertensive heart and chronic kidney disease with heart failure and stage 1 through stage 4 chronic kidney disease, or unspecified chronic kidney disease: Secondary | ICD-10-CM | POA: Diagnosis not present

## 2018-02-04 DIAGNOSIS — Z87891 Personal history of nicotine dependence: Secondary | ICD-10-CM | POA: Insufficient documentation

## 2018-02-04 DIAGNOSIS — Z79899 Other long term (current) drug therapy: Secondary | ICD-10-CM | POA: Insufficient documentation

## 2018-02-04 DIAGNOSIS — Z9001 Acquired absence of eye: Secondary | ICD-10-CM

## 2018-02-04 DIAGNOSIS — E1122 Type 2 diabetes mellitus with diabetic chronic kidney disease: Secondary | ICD-10-CM | POA: Diagnosis not present

## 2018-02-04 DIAGNOSIS — G4730 Sleep apnea, unspecified: Secondary | ICD-10-CM | POA: Insufficient documentation

## 2018-02-04 DIAGNOSIS — N189 Chronic kidney disease, unspecified: Secondary | ICD-10-CM | POA: Insufficient documentation

## 2018-02-04 DIAGNOSIS — Z794 Long term (current) use of insulin: Secondary | ICD-10-CM | POA: Diagnosis not present

## 2018-02-04 DIAGNOSIS — F319 Bipolar disorder, unspecified: Secondary | ICD-10-CM | POA: Diagnosis not present

## 2018-02-04 DIAGNOSIS — Z6841 Body Mass Index (BMI) 40.0 and over, adult: Secondary | ICD-10-CM | POA: Insufficient documentation

## 2018-02-04 DIAGNOSIS — H5462 Unqualified visual loss, left eye, normal vision right eye: Principal | ICD-10-CM | POA: Insufficient documentation

## 2018-02-04 DIAGNOSIS — H5712 Ocular pain, left eye: Secondary | ICD-10-CM | POA: Diagnosis present

## 2018-02-04 DIAGNOSIS — F209 Schizophrenia, unspecified: Secondary | ICD-10-CM | POA: Diagnosis not present

## 2018-02-04 DIAGNOSIS — K219 Gastro-esophageal reflux disease without esophagitis: Secondary | ICD-10-CM | POA: Insufficient documentation

## 2018-02-04 HISTORY — PX: ENUCLEATION: SHX628

## 2018-02-04 LAB — CBC
HCT: 37.7 % (ref 36.0–46.0)
HEMOGLOBIN: 11.7 g/dL — AB (ref 12.0–15.0)
MCH: 29.5 pg (ref 26.0–34.0)
MCHC: 31 g/dL (ref 30.0–36.0)
MCV: 95 fL (ref 78.0–100.0)
PLATELETS: 117 10*3/uL — AB (ref 150–400)
RBC: 3.97 MIL/uL (ref 3.87–5.11)
RDW: 16.2 % — ABNORMAL HIGH (ref 11.5–15.5)
WBC: 6.6 10*3/uL (ref 4.0–10.5)

## 2018-02-04 LAB — BASIC METABOLIC PANEL
ANION GAP: 15 (ref 5–15)
BUN: 33 mg/dL — ABNORMAL HIGH (ref 6–20)
CALCIUM: 9.6 mg/dL (ref 8.9–10.3)
CO2: 20 mmol/L — ABNORMAL LOW (ref 22–32)
CREATININE: 4.65 mg/dL — AB (ref 0.44–1.00)
Chloride: 103 mmol/L (ref 98–111)
GFR, EST AFRICAN AMERICAN: 12 mL/min — AB (ref >60)
GFR, EST NON AFRICAN AMERICAN: 10 mL/min — AB (ref >60)
Glucose, Bld: 196 mg/dL — ABNORMAL HIGH (ref 70–99)
Potassium: 4.6 mmol/L (ref 3.5–5.1)
SODIUM: 138 mmol/L (ref 135–145)

## 2018-02-04 LAB — HEMOGLOBIN A1C
HEMOGLOBIN A1C: 7 % — AB (ref 4.8–5.6)
MEAN PLASMA GLUCOSE: 154.2 mg/dL

## 2018-02-04 LAB — GLUCOSE, CAPILLARY
GLUCOSE-CAPILLARY: 194 mg/dL — AB (ref 70–99)
GLUCOSE-CAPILLARY: 196 mg/dL — AB (ref 70–99)
GLUCOSE-CAPILLARY: 249 mg/dL — AB (ref 70–99)
GLUCOSE-CAPILLARY: 195 mg/dL — AB (ref 70–99)
Glucose-Capillary: 240 mg/dL — ABNORMAL HIGH (ref 70–99)
Glucose-Capillary: 266 mg/dL — ABNORMAL HIGH (ref 70–99)
Glucose-Capillary: 142 mg/dL — ABNORMAL HIGH (ref 70–99)

## 2018-02-04 LAB — PROTIME-INR
INR: 1.1
Prothrombin Time: 14.1 seconds (ref 11.4–15.2)

## 2018-02-04 LAB — HCG, SERUM, QUALITATIVE: Preg, Serum: POSITIVE — AB

## 2018-02-04 SURGERY — ENUCLEATION, EYE
Anesthesia: General | Site: Eye | Laterality: Left

## 2018-02-04 MED ORDER — OXYCODONE HCL 5 MG PO TABS
ORAL_TABLET | ORAL | Status: AC
  Filled 2018-02-04: qty 1

## 2018-02-04 MED ORDER — SUCCINYLCHOLINE CHLORIDE 200 MG/10ML IV SOSY
PREFILLED_SYRINGE | INTRAVENOUS | Status: DC | PRN
  Administered 2018-02-04: 80 mg via INTRAVENOUS

## 2018-02-04 MED ORDER — HYDRALAZINE HCL 25 MG PO TABS
25.0000 mg | ORAL_TABLET | Freq: Three times a day (TID) | ORAL | Status: DC
  Administered 2018-02-04 – 2018-02-05 (×3): 25 mg via ORAL
  Filled 2018-02-04 (×3): qty 1

## 2018-02-04 MED ORDER — DEXAMETHASONE SODIUM PHOSPHATE 10 MG/ML IJ SOLN
INTRAMUSCULAR | Status: AC
  Filled 2018-02-04: qty 1

## 2018-02-04 MED ORDER — ALPRAZOLAM 0.5 MG PO TABS
1.0000 mg | ORAL_TABLET | Freq: Four times a day (QID) | ORAL | Status: DC | PRN
Start: 2018-02-04 — End: 2018-02-05

## 2018-02-04 MED ORDER — BUPIVACAINE HCL (PF) 0.5 % IJ SOLN
INTRAMUSCULAR | Status: AC
  Filled 2018-02-04: qty 10

## 2018-02-04 MED ORDER — INSULIN GLARGINE 100 UNIT/ML SOLOSTAR PEN
10.0000 [IU] | PEN_INJECTOR | Freq: Every day | SUBCUTANEOUS | Status: DC
Start: 2018-02-04 — End: 2018-02-04

## 2018-02-04 MED ORDER — OXYCODONE HCL 5 MG PO TABS
5.0000 mg | ORAL_TABLET | Freq: Once | ORAL | Status: AC | PRN
  Administered 2018-02-04: 5 mg via ORAL

## 2018-02-04 MED ORDER — INSULIN ASPART 100 UNIT/ML ~~LOC~~ SOLN
0.0000 [IU] | Freq: Three times a day (TID) | SUBCUTANEOUS | Status: DC
  Administered 2018-02-04: 4 [IU] via SUBCUTANEOUS
  Administered 2018-02-05: 3 [IU] via SUBCUTANEOUS

## 2018-02-04 MED ORDER — ONDANSETRON HCL 4 MG/2ML IJ SOLN
INTRAMUSCULAR | Status: AC
  Filled 2018-02-04: qty 2

## 2018-02-04 MED ORDER — GENTAMICIN SULFATE 40 MG/ML IJ SOLN
INTRAMUSCULAR | Status: AC
  Filled 2018-02-04: qty 2

## 2018-02-04 MED ORDER — OXYCODONE HCL 5 MG/5ML PO SOLN: 5.0000 mg | Freq: Once | ORAL | Status: AC | PRN

## 2018-02-04 MED ORDER — INSULIN ASPART 100 UNIT/ML ~~LOC~~ SOLN
SUBCUTANEOUS | Status: AC
  Administered 2018-02-04: 15 [IU] via SUBCUTANEOUS
  Filled 2018-02-04: qty 1

## 2018-02-04 MED ORDER — CEFAZOLIN SODIUM-DEXTROSE 1-4 GM/50ML-% IV SOLN
INTRAVENOUS | Status: AC
  Filled 2018-02-04: qty 50

## 2018-02-04 MED ORDER — BUPIVACAINE HCL (PF) 0.5 % IJ SOLN
INTRAMUSCULAR | Status: DC | PRN
  Administered 2018-02-04: 2 mL

## 2018-02-04 MED ORDER — RISPERIDONE 0.5 MG PO TABS
0.5000 mg | ORAL_TABLET | Freq: Two times a day (BID) | ORAL | Status: DC
  Administered 2018-02-04: 0.5 mg via ORAL
  Filled 2018-02-04 (×2): qty 1

## 2018-02-04 MED ORDER — LIDOCAINE-EPINEPHRINE 1 %-1:100000 IJ SOLN
INTRAMUSCULAR | Status: DC | PRN
  Administered 2018-02-04: 2 mL

## 2018-02-04 MED ORDER — TORSEMIDE 20 MG PO TABS: 20.0000 mg | ORAL_TABLET | Freq: Every day | ORAL | Status: DC

## 2018-02-04 MED ORDER — MIDAZOLAM HCL 2 MG/2ML IJ SOLN
INTRAMUSCULAR | Status: AC
  Filled 2018-02-04: qty 2

## 2018-02-04 MED ORDER — ENOXAPARIN SODIUM 30 MG/0.3ML ~~LOC~~ SOLN
30.0000 mg | SUBCUTANEOUS | Status: DC
  Administered 2018-02-05: 30 mg via SUBCUTANEOUS
  Filled 2018-02-04: qty 0.3

## 2018-02-04 MED ORDER — CARVEDILOL 12.5 MG PO TABS
12.5000 mg | ORAL_TABLET | Freq: Two times a day (BID) | ORAL | Status: DC
  Administered 2018-02-04 – 2018-02-05 (×2): 12.5 mg via ORAL
  Filled 2018-02-04 (×2): qty 1

## 2018-02-04 MED ORDER — FENTANYL CITRATE (PF) 250 MCG/5ML IJ SOLN
INTRAMUSCULAR | Status: AC
  Filled 2018-02-04: qty 5

## 2018-02-04 MED ORDER — GLYCOPYRROLATE 0.2 MG/ML IJ SOLN
INTRAMUSCULAR | Status: DC | PRN
  Administered 2018-02-04: 0.2 mg via INTRAVENOUS

## 2018-02-04 MED ORDER — PRISMASOL BGK 0/2.5 32-2.5 MEQ/L IV SOLN: INTRAVENOUS | Status: DC

## 2018-02-04 MED ORDER — LIDOCAINE 2% (20 MG/ML) 5 ML SYRINGE
INTRAMUSCULAR | Status: DC | PRN
  Administered 2018-02-04: 60 mg via INTRAVENOUS

## 2018-02-04 MED ORDER — ATORVASTATIN CALCIUM 20 MG PO TABS: 20.0000 mg | ORAL_TABLET | Freq: Every day | ORAL | Status: DC

## 2018-02-04 MED ORDER — LIDOCAINE-EPINEPHRINE 1 %-1:100000 IJ SOLN
INTRAMUSCULAR | Status: AC
  Filled 2018-02-04: qty 1

## 2018-02-04 MED ORDER — ESMOLOL HCL 100 MG/10ML IV SOLN
INTRAVENOUS | Status: AC
  Filled 2018-02-04: qty 10

## 2018-02-04 MED ORDER — CEFAZOLIN SODIUM-DEXTROSE 2-3 GM-%(50ML) IV SOLR
INTRAVENOUS | Status: DC | PRN
  Administered 2018-02-04: 2 g via INTRAVENOUS

## 2018-02-04 MED ORDER — ESMOLOL HCL 100 MG/10ML IV SOLN
INTRAVENOUS | Status: DC | PRN
  Administered 2018-02-04: 10 mg via INTRAVENOUS

## 2018-02-04 MED ORDER — SODIUM CHLORIDE 0.9 % IV SOLN
INTRAVENOUS | Status: DC
  Administered 2018-02-04: 16:00:00 via INTRAVENOUS

## 2018-02-04 MED ORDER — PROPOFOL 10 MG/ML IV BOLUS
INTRAVENOUS | Status: DC | PRN
  Administered 2018-02-04: 120 mg via INTRAVENOUS

## 2018-02-04 MED ORDER — INSULIN GLARGINE 100 UNIT/ML ~~LOC~~ SOLN
10.0000 [IU] | Freq: Every day | SUBCUTANEOUS | Status: DC
  Administered 2018-02-04: 10 [IU] via SUBCUTANEOUS
  Filled 2018-02-04: qty 0.1

## 2018-02-04 MED ORDER — HYDROMORPHONE HCL 1 MG/ML IJ SOLN
2.0000 mg | Freq: Once | INTRAMUSCULAR | Status: AC
  Administered 2018-02-04: 2 mg via INTRAVENOUS
  Filled 2018-02-04: qty 2

## 2018-02-04 MED ORDER — HYDROCODONE-ACETAMINOPHEN 10-325 MG PO TABS
2.0000 | ORAL_TABLET | Freq: Four times a day (QID) | ORAL | Status: DC | PRN
  Administered 2018-02-04 – 2018-02-05 (×2): 2 via ORAL
  Filled 2018-02-04 (×2): qty 2

## 2018-02-04 MED ORDER — BSS IO SOLN
INTRAOCULAR | Status: AC
  Filled 2018-02-04: qty 15

## 2018-02-04 MED ORDER — 0.9 % SODIUM CHLORIDE (POUR BTL) OPTIME
TOPICAL | Status: DC | PRN
  Administered 2018-02-04: 1000 mL

## 2018-02-04 MED ORDER — PROMETHAZINE HCL 25 MG/ML IJ SOLN: 12.5000 mg | Freq: Four times a day (QID) | INTRAMUSCULAR | Status: DC | PRN

## 2018-02-04 MED ORDER — TOBRAMYCIN-DEXAMETHASONE 0.3-0.1 % OP OINT
TOPICAL_OINTMENT | OPHTHALMIC | Status: AC
  Filled 2018-02-04: qty 3.5

## 2018-02-04 MED ORDER — CEFAZOLIN SODIUM-DEXTROSE 2-4 GM/100ML-% IV SOLN
INTRAVENOUS | Status: AC
  Filled 2018-02-04: qty 100

## 2018-02-04 MED ORDER — FENTANYL CITRATE (PF) 100 MCG/2ML IJ SOLN
INTRAMUSCULAR | Status: DC | PRN
  Administered 2018-02-04 (×2): 25 ug via INTRAVENOUS
  Administered 2018-02-04: 50 ug via INTRAVENOUS
  Administered 2018-02-04: 150 ug via INTRAVENOUS

## 2018-02-04 MED ORDER — COLCHICINE 0.6 MG PO TABS: 0.6000 mg | ORAL_TABLET | ORAL | Status: DC

## 2018-02-04 MED ORDER — DICYCLOMINE HCL 10 MG PO CAPS
10.0000 mg | ORAL_CAPSULE | Freq: Three times a day (TID) | ORAL | Status: DC
  Administered 2018-02-04 – 2018-02-05 (×3): 10 mg via ORAL
  Filled 2018-02-04 (×3): qty 1

## 2018-02-04 MED ORDER — FAMOTIDINE IN NACL 20-0.9 MG/50ML-% IV SOLN
20.0000 mg | INTRAVENOUS | Status: DC
  Administered 2018-02-04: 20 mg via INTRAVENOUS
  Filled 2018-02-04: qty 50

## 2018-02-04 MED ORDER — PAROXETINE HCL 20 MG PO TABS
20.0000 mg | ORAL_TABLET | Freq: Every day | ORAL | Status: DC
  Administered 2018-02-04: 20 mg via ORAL
  Filled 2018-02-04: qty 1

## 2018-02-04 MED ORDER — INSULIN ASPART 100 UNIT/ML ~~LOC~~ SOLN
3.0000 [IU] | Freq: Three times a day (TID) | SUBCUTANEOUS | Status: DC
  Administered 2018-02-04 – 2018-02-05 (×2): 3 [IU] via SUBCUTANEOUS

## 2018-02-04 MED ORDER — CALCIUM CARBONATE ANTACID 500 MG PO CHEW
3.0000 | CHEWABLE_TABLET | Freq: Three times a day (TID) | ORAL | Status: DC
  Administered 2018-02-04 – 2018-02-05 (×2): 600 mg via ORAL
  Filled 2018-02-04 (×8): qty 3

## 2018-02-04 MED ORDER — ACETAMINOPHEN 500 MG PO TABS
1000.0000 mg | ORAL_TABLET | Freq: Four times a day (QID) | ORAL | Status: DC
  Administered 2018-02-04: 1000 mg via ORAL
  Filled 2018-02-04: qty 2

## 2018-02-04 MED ORDER — FENTANYL CITRATE (PF) 100 MCG/2ML IJ SOLN
25.0000 ug | INTRAMUSCULAR | Status: DC | PRN
  Administered 2018-02-04 (×3): 50 ug via INTRAVENOUS

## 2018-02-04 MED ORDER — LIDOCAINE-EPINEPHRINE 2 %-1:100000 IJ SOLN
INTRAMUSCULAR | Status: AC
  Filled 2018-02-04: qty 1

## 2018-02-04 MED ORDER — BUPIVACAINE HCL (PF) 0.75 % IJ SOLN
INTRAMUSCULAR | Status: AC
  Filled 2018-02-04: qty 10

## 2018-02-04 MED ORDER — CEFAZOLIN SODIUM-DEXTROSE 1-4 GM/50ML-% IV SOLN
INTRAVENOUS | Status: DC | PRN
  Administered 2018-02-04: 1 g via INTRAVENOUS

## 2018-02-04 MED ORDER — INSULIN ASPART 100 UNIT/ML ~~LOC~~ SOLN
15.0000 [IU] | Freq: Once | SUBCUTANEOUS | Status: AC
  Administered 2018-02-04: 15 [IU] via SUBCUTANEOUS

## 2018-02-04 MED ORDER — PROPOFOL 1000 MG/100ML IV EMUL
INTRAVENOUS | Status: AC
  Filled 2018-02-04: qty 100

## 2018-02-04 MED ORDER — INSULIN ASPART 100 UNIT/ML ~~LOC~~ SOLN
8.0000 [IU] | Freq: Once | SUBCUTANEOUS | Status: AC
  Administered 2018-02-04: 8 [IU] via SUBCUTANEOUS

## 2018-02-04 MED ORDER — TOBRAMYCIN 0.3 % OP OINT
TOPICAL_OINTMENT | OPHTHALMIC | Status: DC | PRN
  Administered 2018-02-04: 1

## 2018-02-04 MED ORDER — DEXAMETHASONE SODIUM PHOSPHATE 10 MG/ML IJ SOLN
INTRAMUSCULAR | Status: DC | PRN
  Administered 2018-02-04: 4 mg via INTRAVENOUS

## 2018-02-04 MED ORDER — FENTANYL CITRATE (PF) 100 MCG/2ML IJ SOLN
INTRAMUSCULAR | Status: AC
  Administered 2018-02-04: 50 ug via INTRAVENOUS
  Filled 2018-02-04: qty 2

## 2018-02-04 MED ORDER — CALCIUM CARBONATE ANTACID 500 MG PO CHEW: 3.0000 | CHEWABLE_TABLET | Freq: Three times a day (TID) | ORAL | Status: DC | PRN

## 2018-02-04 MED ORDER — INSULIN ASPART 100 UNIT/ML ~~LOC~~ SOLN
SUBCUTANEOUS | Status: AC
  Administered 2018-02-04: 8 [IU] via SUBCUTANEOUS
  Filled 2018-02-04: qty 1

## 2018-02-04 MED ORDER — INSULIN ASPART 100 UNIT/ML ~~LOC~~ SOLN: 0.0000 [IU] | Freq: Every day | SUBCUTANEOUS | Status: DC

## 2018-02-04 MED ORDER — BUSPIRONE HCL 15 MG PO TABS
7.5000 mg | ORAL_TABLET | Freq: Two times a day (BID) | ORAL | Status: DC
  Administered 2018-02-04: 7.5 mg via ORAL
  Filled 2018-02-04 (×2): qty 1

## 2018-02-04 MED ORDER — SODIUM CHLORIDE 0.9 % IV SOLN
INTRAVENOUS | Status: DC
  Administered 2018-02-04 (×2): via INTRAVENOUS

## 2018-02-04 MED ORDER — ONDANSETRON HCL 4 MG/2ML IJ SOLN
INTRAMUSCULAR | Status: DC | PRN
  Administered 2018-02-04: 4 mg via INTRAVENOUS

## 2018-02-04 MED ORDER — MIDAZOLAM HCL 5 MG/5ML IJ SOLN
INTRAMUSCULAR | Status: DC | PRN
  Administered 2018-02-04: 2 mg via INTRAVENOUS

## 2018-02-04 MED ORDER — GABAPENTIN 300 MG PO CAPS
300.0000 mg | ORAL_CAPSULE | Freq: Two times a day (BID) | ORAL | Status: DC
  Administered 2018-02-04: 300 mg via ORAL
  Filled 2018-02-04: qty 1

## 2018-02-04 MED ORDER — LIDOCAINE-PRILOCAINE 2.5-2.5 % EX CREA: 1.0000 "application " | TOPICAL_CREAM | CUTANEOUS | Status: DC | PRN

## 2018-02-04 SURGICAL SUPPLY — 42 items
APPLICATOR COTTON TIP 6IN STRL (MISCELLANEOUS) ×2 IMPLANT
APPLICATOR DR MATTHEWS STRL (MISCELLANEOUS) ×6 IMPLANT
BAG DECANTER FOR FLEXI CONT (MISCELLANEOUS) IMPLANT
BLADE 10 SAFETY STRL DISP (BLADE) IMPLANT
BNDG COHESIVE 2X5 WHT NS (GAUZE/BANDAGES/DRESSINGS) IMPLANT
CLSR STERI-STRIP ANTIMIC 1/2X4 (GAUZE/BANDAGES/DRESSINGS) ×4 IMPLANT
COAGULATOR SUCT 8FR VV (MISCELLANEOUS) ×2 IMPLANT
CONFORMER OPHTHALMIC MD W/HOLE (MISCELLANEOUS) ×2 IMPLANT
CONFORMER OPHTHALMIC SM W-HOLE (MISCELLANEOUS) ×2 IMPLANT
CORD BIPOLAR FORCEPS 12FT (ELECTRODE) ×2 IMPLANT
COVER SURGICAL LIGHT HANDLE (MISCELLANEOUS) ×2 IMPLANT
DRAPE ORTHO SPLIT 87X125 STRL (DRAPES) ×2 IMPLANT
ELECT REM PT RETURN 9FT ADLT (ELECTROSURGICAL)
ELECTRODE REM PT RTRN 9FT ADLT (ELECTROSURGICAL) IMPLANT
GLOVE BIO SURGEON STRL SZ7.5 (GLOVE) ×2 IMPLANT
GOWN STRL REUS W/ TWL LRG LVL3 (GOWN DISPOSABLE) ×2 IMPLANT
GOWN STRL REUS W/TWL LRG LVL3 (GOWN DISPOSABLE) ×2
HEMOSTAT SURGICEL 2X14 (HEMOSTASIS) IMPLANT
IMPL MEDPOR SPHERE EYE 20MM (Ophthalmic Related) ×1 IMPLANT
IMPLANT MEDPOR SPHERE EYE 20MM (Ophthalmic Related) ×2 IMPLANT
KIT BASIN OR (CUSTOM PROCEDURE TRAY) ×2 IMPLANT
KIT TURNOVER KIT B (KITS) ×2 IMPLANT
NEEDLE RETROBULBAR 25GX1.5 (NEEDLE) ×2 IMPLANT
NS IRRIG 1000ML POUR BTL (IV SOLUTION) ×2 IMPLANT
PACK CATARACT CUSTOM (CUSTOM PROCEDURE TRAY) ×2 IMPLANT
PAD ARMBOARD 7.5X6 YLW CONV (MISCELLANEOUS) ×2 IMPLANT
SPECIMEN JAR SMALL (MISCELLANEOUS) ×2 IMPLANT
SUT CHROMIC 5 0 P 3 (SUTURE) IMPLANT
SUT SILK 2 0 (SUTURE)
SUT SILK 2-0 18XBRD TIE 12 (SUTURE) IMPLANT
SUT SILK 4 0 P 3 (SUTURE) ×4 IMPLANT
SUT VIC AB 5-0 P-3 18XBRD (SUTURE) ×4 IMPLANT
SUT VIC AB 5-0 P3 18 (SUTURE) ×4
SUT VICRYL 6-0 S14 (SUTURE) IMPLANT
SUT VICRYL 7 0 TG140 8 (SUTURE) ×2 IMPLANT
SUT VICRYL ABS 5-0 PS5 18IN (SUTURE) IMPLANT
SYR 30ML LL (SYRINGE) ×2 IMPLANT
SYR 50ML LL SCALE MARK (SYRINGE) ×2 IMPLANT
TOWEL NATURAL 6PK STERILE (DISPOSABLE) ×2 IMPLANT
TUBING BULK SUCTION (MISCELLANEOUS) ×2 IMPLANT
WATER STERILE IRR 1000ML POUR (IV SOLUTION) ×2 IMPLANT
YANKAUER SUCT BULB TIP NO VENT (SUCTIONS) ×2 IMPLANT

## 2018-02-04 NOTE — Discharge Instructions (Signed)
Keep patch on for 24 Hrs. Do not get the patch wet.  Sleep on back with head elevated. MD will remove/change the dressings at the post operative appointment.

## 2018-02-04 NOTE — Brief Op Note (Signed)
02/04/2018  10:24 AM  PATIENT:  Martha [name]  52 y.o. female  PRE-OPERATIVE DIAGNOSIS:  blind painful left eye  POST-OPERATIVE DIAGNOSIS:  * No post-op diagnosis entered *  PROCEDURE:  Procedure(s): ENUCLEATION WITH PLACEMENT OF IMPLANT LEFT EYE (Left)  SURGEON:  Surgeon(s) and Role:    * Madalee Altmann, Peyton Najjar, MD - Primary  PHYSICIAN ASSISTANT:   ASSISTANTS: none  ANESTHESIA:   local and general  EBL:  Minimal  BLOOD ADMINISTERED:none  DRAINS: none   LOCAL MEDICATIONS USED:  BUPIVICAINE  and LIDOCAINE   SPECIMEN:  Source of Specimen:  LEFT EYE  DISPOSITION OF SPECIMEN:  PATHOLOGY  COUNTS:  YES  TOURNIQUET:  * No tourniquets in log *  DICTATION: .Note written in EPIC  PLAN OF CARE: Admit for overnight observation  PATIENT DISPOSITION:  PACU - hemodynamically stable.   Delay start of Pharmacological VTE agent (>24hrs) due to surgical blood loss or risk of bleeding: no

## 2018-02-04 NOTE — Op Note (Signed)
Procedure(s): ENUCLEATION WITH PLACEMENT OF IMPLANT LEFT EYE Procedure Note  Martha [name] female 52 y.o. 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 An eye exam revealed findings of  Blind painful left eye . The patient now presents for enucleation with placement of implant of the left eye  after discussing therapeutic alternatives.        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 The risks and benefits of the procedure were reviewed with the patient including bleeding, infection, implant extrusion, scarring, and loss of life. The patient understands and agrees to proceed with an enucleation of the right eye with implant and attachment of the muscles to the implant.   The patient was transported to the operating room in supine position where they were prepped and draped in the standard fashion for oculoplastic surgery. Attention was turned to the right eye which was re-confirmed to the be the correct eye. Local anesthesia was injected subconjunctivally. Then a 360 degree peritomy was completed with [place] scissors. Then blunt dissection was completed in all for quadrants with Gerilyn Nestle scissor to aide in the isolation of the horizonatl and vertical recti. Then each muscle was identified and hooked with a von Graefe hook followed by a Green hook in the opposite direction to ensure the complete hooking of the muscle. Then each muscle was imbricated with a 5-0 vicryl suture and then the sutures were taped to the dressings. Then the inferior oblique was  isolated and then cauterized and then removed.  4-0 silk sutures were used to make traction sutures in the area of the medial and lateral recti. The globe was not perforated during this process. Then forward traction was placed on the globe. Then a long curved hemostat was used to isolate and clamp the optic nerve. Then a long curved [employer] was used to isolate and then the optic nerve was bisected with the scissors.. Any additional tissue was removed from the back of the globe. The optic nerve that was clamped was visualized and cauterized. Hemostasis was maintained with monopolar and bipolar cautery.  Attention was turned to the implant. The smooth surface was marked and the implant was flushed with a misture of normal saline and gentamicin to ensure removal of the air. The implant was introduced with an introducer. It was ensured that minimal tension was required to close the tenons. Then the muscles were attached to the implant. Then the tenons was closed in an interrupted fashion with 5-0 vicryl and then the conjunctiva was closed in a running locking fashion with 7-0 vicryl.  Then ointment was placed followed by a conformer. Then a frost suture was placed.   A retrobulbar block was performed.  Then a pressure patch was placed.   The patient tolerated the procedure well and was transported to PACU in stable condition.  Findings: Disorganized left eye  Estimated Blood Loss:  less than 50 mL         Drains: nione         Total IV Fluids: <  2L  Blood Given: none          Specimens: LEFT Eye         Implants: 57mm medpor implant        Complications:  * No complications entered in OR log *         Disposition: PACU - hemodynamically stable.         Condition: stable

## 2018-02-04 NOTE — Transfer of Care (Signed)
Immediate Anesthesia Transfer of Care Note  Patient: Martha [name]  Procedure(s) Performed: ENUCLEATION WITH PLACEMENT OF IMPLANT LEFT EYE (Left Eye)  Patient Location: PACU  Anesthesia Type:General  Level of Consciousness: drowsy and patient cooperative  Airway & Oxygen Therapy: Patient Spontanous Breathing and Patient connected to nasal cannula oxygen  Post-op Assessment: Report given to RN, Post -op Vital signs reviewed and stable and Patient moving all extremities  Post vital signs: Reviewed and stable  Last Vitals:  Vitals Value Taken Time  BP    Temp    Pulse 76 02/04/2018  1:23 PM  Resp 16 02/04/2018  1:23 PM  SpO2 96 % 02/04/2018  1:23 PM  Vitals shown include unvalidated device data.  Last Pain:  Vitals:   02/04/18 0914  TempSrc:   PainSc: 2       Patients Stated Pain Goal: 4 (38/45/[id number])  Complications: No apparent anesthesia complications

## 2018-02-04 NOTE — Anesthesia Procedure Notes (Signed)
Procedure Name: Intubation Date/Time: 02/04/2018 11:44 AM Performed by: Moshe Salisbury, CRNA Pre-anesthesia Checklist: Patient identified, Emergency Drugs available, Suction available and Patient being monitored Patient Re-evaluated:Patient Re-evaluated prior to induction Oxygen Delivery Method: Circle System Utilized Preoxygenation: Pre-oxygenation with 100% oxygen Induction Type: IV induction Ventilation: Mask ventilation without difficulty Laryngoscope Size: Mac and 3 Grade View: Grade II Tube type: Oral Tube size: 7.5 mm Number of attempts: 1 Airway Equipment and Method: Stylet Placement Confirmation: ETT inserted through vocal cords under direct vision,  positive ETCO2 and breath sounds checked- equal and bilateral Secured at: 22 cm Tube secured with: Tape Dental Injury: Teeth and Oropharynx as per pre-operative assessment

## 2018-02-04 NOTE — Anesthesia Preprocedure Evaluation (Addendum)
Anesthesia Evaluation  Patient identified by MRN, date of birth, ID band Patient awake    Reviewed: Allergy & Precautions, NPO status , Patient's Chart, lab work & pertinent test results, reviewed documented beta blocker date and time   History of Anesthesia Complications Negative for: history of anesthetic complications  Airway Mallampati: I  TM Distance: >3 FB Neck ROM: Full    Dental  (+) Teeth Intact, Dental Advisory Given,    Pulmonary sleep apnea and Continuous Positive Airway Pressure Ventilation , former smoker,    breath sounds clear to auscultation       Cardiovascular hypertension, Pt. on medications and Pt. on home beta blockers +CHF   Rhythm:Regular     Neuro/Psych PSYCHIATRIC DISORDERS Anxiety Depression Bipolar Disorder Schizophrenia  Neuromuscular disease    GI/Hepatic Neg liver ROS, GERD  Controlled,  Endo/Other  diabetes, Type 2, Insulin DependentMorbid obesity  Renal/GU Dialysis and CRFRenal disease     Musculoskeletal   Abdominal   Peds  Hematology  (+) anemia ,   Anesthesia Other Findings   Reproductive/Obstetrics                            Anesthesia Physical Anesthesia Plan  ASA: IV  Anesthesia Plan: General   Post-op Pain Management:    Induction: Intravenous  PONV Risk Score and Plan: 3 and Ondansetron, Dexamethasone and Midazolam  Airway Management Planned: Oral ETT  Additional Equipment: None  Intra-op Plan:   Post-operative Plan: Extubation in OR  Informed Consent: I have reviewed the patients History and Physical, chart, labs and discussed the procedure including the risks, benefits and alternatives for the proposed anesthesia with the patient or authorized representative who has indicated his/her understanding and acceptance.   Dental advisory given  Plan Discussed with: CRNA, Anesthesiologist and Surgeon  Anesthesia Plan Comments:          Anesthesia Quick Evaluation

## 2018-02-04 NOTE — Progress Notes (Signed)
Visual safety check performed on patient's home CPAP machine.  Machine and power cord with no obvious defects, OK to use.

## 2018-02-04 NOTE — Interval H&P Note (Signed)
History and Physical Interval Note:  02/04/2018 9:54 AM  Martha [name]  has presented today for surgery, with the diagnosis of blind painful left eye  The various methods of treatment have been discussed with the patient and family. After consideration of risks, benefits and other options for treatment, the patient has consented to  Procedure(s): ENUCLEATION WITH PLACEMENT OF IMPLANT LEFT EYE (Left) as a surgical intervention .  The patient's history has been reviewed, patient examined, no change in status, stable for surgery.  I have reviewed the patient's chart and labs.  Questions were answered to the patient's satisfaction.     Clista Bernhardt

## 2018-02-05 ENCOUNTER — Other Ambulatory Visit: Payer: Self-pay

## 2018-02-05 ENCOUNTER — Encounter (HOSPITAL_COMMUNITY): Payer: Self-pay | Admitting: Oculoplastics Ophthalmology

## 2018-02-05 DIAGNOSIS — H5462 Unqualified visual loss, left eye, normal vision right eye: Secondary | ICD-10-CM | POA: Diagnosis not present

## 2018-02-05 LAB — GLUCOSE, CAPILLARY: Glucose-Capillary: 150 mg/dL — ABNORMAL HIGH (ref 70–99)

## 2018-02-05 LAB — HIV ANTIBODY (ROUTINE TESTING W REFLEX): HIV Screen 4th Generation wRfx: NONREACTIVE

## 2018-02-05 LAB — MRSA PCR SCREENING: MRSA by PCR: NEGATIVE

## 2018-02-05 MED ORDER — FAMOTIDINE 20 MG PO TABS: 20.0000 mg | ORAL_TABLET | Freq: Every day | ORAL | Status: DC

## 2018-02-05 NOTE — Anesthesia Postprocedure Evaluation (Signed)
Anesthesia Post Note  Patient: Martha [name]  Procedure(s) Performed: ENUCLEATION WITH PLACEMENT OF IMPLANT LEFT EYE (Left Eye)     Patient location during evaluation: PACU Anesthesia Type: General Level of consciousness: awake and alert Pain management: pain level controlled Vital Signs Assessment: post-procedure vital signs reviewed and stable Respiratory status: spontaneous breathing, nonlabored ventilation, respiratory function stable and patient connected to nasal cannula oxygen Cardiovascular status: blood pressure returned to baseline and stable Postop Assessment: no apparent nausea or vomiting Anesthetic complications: no    Last Vitals:  Vitals:   02/04/18 2159 02/05/18 0502  BP: (!) 152/87 (!) 144/75  Pulse: 72 67  Resp: 18 17  Temp: 36.5 C 36.4 C  SpO2: 97% 99%    Last Pain:  Vitals:   02/05/18 0640  TempSrc:   PainSc: 5                  Trenity Pha

## 2018-02-05 NOTE — Discharge Summary (Signed)
Physician Discharge Summary   Patient ID: Martha [name] [id number] 52 y.o. 08-20-1965  Admit date: 02/04/2018  Discharge date and time: 02/05/2018 11:19 AM   Admitting Physician: Clista Bernhardt, MD   Discharge Physician: Georjean Mode MD  Admission Diagnoses: blind painful left eye  Discharge Diagnoses: blind painful left eye  Admission Condition: good  Discharged Condition: good  Indication for Admission: s/p enucleation of left eye   Hospital Course:  Patient was admitted for a 23 hr observation for pain and nausea control and post operative care following enucleation of the left eye. Her stay was uncomplicated. The patient had a small amount of break through pain that was resolved with a dose of Dilaudid. The patient followed up right after discharge with Dr. Kristeen Miss in clinic.  Consults: None  Significant Diagnostic Studies: none  Treatments: IV hydration, analgesia: Dilaudid, anticoagulation: heparin and insulin: Lantus  Discharge Exam: No exam performed today, patient went straight to clinic for followup evaluation with surgeon.  Disposition:   Patient Instructions:  Allergies as of 02/05/2018      Reactions   Codeine Nausea And Vomiting   Tape Other (See Comments)   Plastic Tape      Medication List    TAKE these medications   ALPRAZolam 1 MG tablet Commonly known as:  XANAX Take 1 mg by mouth 4 (four) times daily as needed for anxiety (nerves).   atorvastatin 10 MG tablet Commonly known as:  LIPITOR Take 20 mg by mouth daily.   busPIRone 7.5 MG tablet Commonly known as:  BUSPAR Take 7.5 mg by mouth 2 (two) times daily.   calcium carbonate 750 MG chewable tablet Commonly known as:  TUMS EX Chew 1-3 tablets by mouth 6 (six) times daily. Patient takes 3 tablets with meals and 1 tablet with snacks three times a day   carvedilol 12.5 MG tablet Commonly known as:  COREG Take 1 tablet (12.5 mg total) by mouth 2 (two) times daily with a meal.    colchicine 0.6 MG tablet Take 1 tablet (0.6 mg total) by mouth every other day.   dicyclomine 10 MG capsule Commonly known as:  BENTYL Take 1 capsule (10 mg total) by mouth 4 (four) times daily -  before meals and at bedtime.   gabapentin 300 MG capsule Commonly known as:  NEURONTIN Take 300 mg by mouth 2 (two) times daily.   hydrALAZINE 25 MG tablet Commonly known as:  APRESOLINE Take 1 tablet (25 mg total) by mouth every 8 (eight) hours. What changed:  when to take this   HYDROcodone-acetaminophen 10-325 MG tablet Commonly known as:  NORCO Take 1 tablet by mouth every 6 (six) hours as needed for moderate pain. What changed:  when to take this   LANTUS SOLOSTAR 100 UNIT/ML Solostar Pen Generic drug:  Insulin Glargine Inject 10-25 Units into the skin at bedtime. Sliding scale   lidocaine-prilocaine cream Commonly known as:  EMLA Apply 1 application topically as needed (topical anesthesia for hemodialysis if Gebauers and Lidocaine injection are ineffective.).   PARoxetine 20 MG tablet Commonly known as:  PAXIL Take 1 daily. This is to prevent panic attacks What changed:    how much to take  how to take this  when to take this  additional instructions   risperiDONE 0.5 MG tablet Commonly known as:  RISPERDAL Take 1 tablet (0.5 mg total) by mouth 2 (two) times daily.   torsemide 20 MG tablet Commonly known as:  DEMADEX Take 20 mg  by mouth daily.      Activity: activity as tolerated Diet: renal diet Wound Care: keep wound clean and dry  Follow-up with same day in clinic with Dr. Kristeen Miss  Signed: Clista Bernhardt 02/05/2018 4:00 PM

## 2018-02-25 ENCOUNTER — Inpatient Hospital Stay (HOSPITAL_COMMUNITY)
Admission: EM | Admit: 2018-02-25 | Discharge: 2018-02-27 | DRG: 640 | Disposition: A | Discharge status: Home / Self Care | Payer: Medicaid Other | Attending: Internal Medicine | Admitting: Internal Medicine

## 2018-02-25 ENCOUNTER — Encounter (HOSPITAL_COMMUNITY): Payer: Self-pay | Admitting: Emergency Medicine

## 2018-02-25 ENCOUNTER — Emergency Department (HOSPITAL_COMMUNITY): Payer: Medicaid Other

## 2018-02-25 ENCOUNTER — Other Ambulatory Visit: Payer: Self-pay

## 2018-02-25 DIAGNOSIS — E1165 Type 2 diabetes mellitus with hyperglycemia: Secondary | ICD-10-CM | POA: Diagnosis present

## 2018-02-25 DIAGNOSIS — R0602 Shortness of breath: Secondary | ICD-10-CM

## 2018-02-25 DIAGNOSIS — G4730 Sleep apnea, unspecified: Secondary | ICD-10-CM | POA: Diagnosis present

## 2018-02-25 DIAGNOSIS — E1140 Type 2 diabetes mellitus with diabetic neuropathy, unspecified: Secondary | ICD-10-CM | POA: Diagnosis present

## 2018-02-25 DIAGNOSIS — Z87891 Personal history of nicotine dependence: Secondary | ICD-10-CM

## 2018-02-25 DIAGNOSIS — Z794 Long term (current) use of insulin: Secondary | ICD-10-CM

## 2018-02-25 DIAGNOSIS — F319 Bipolar disorder, unspecified: Secondary | ICD-10-CM | POA: Diagnosis present

## 2018-02-25 DIAGNOSIS — F418 Other specified anxiety disorders: Secondary | ICD-10-CM | POA: Diagnosis present

## 2018-02-25 DIAGNOSIS — E119 Type 2 diabetes mellitus without complications: Secondary | ICD-10-CM

## 2018-02-25 DIAGNOSIS — E669 Obesity, unspecified: Secondary | ICD-10-CM | POA: Diagnosis present

## 2018-02-25 DIAGNOSIS — H5462 Unqualified visual loss, left eye, normal vision right eye: Secondary | ICD-10-CM | POA: Diagnosis present

## 2018-02-25 DIAGNOSIS — J449 Chronic obstructive pulmonary disease, unspecified: Secondary | ICD-10-CM | POA: Diagnosis present

## 2018-02-25 DIAGNOSIS — E11319 Type 2 diabetes mellitus with unspecified diabetic retinopathy without macular edema: Secondary | ICD-10-CM | POA: Diagnosis present

## 2018-02-25 DIAGNOSIS — I120 Hypertensive chronic kidney disease with stage 5 chronic kidney disease or end stage renal disease: Secondary | ICD-10-CM | POA: Diagnosis present

## 2018-02-25 DIAGNOSIS — N186 End stage renal disease: Secondary | ICD-10-CM | POA: Diagnosis present

## 2018-02-25 DIAGNOSIS — R739 Hyperglycemia, unspecified: Secondary | ICD-10-CM

## 2018-02-25 DIAGNOSIS — E8770 Fluid overload, unspecified: Secondary | ICD-10-CM | POA: Diagnosis present

## 2018-02-25 DIAGNOSIS — G2581 Restless legs syndrome: Secondary | ICD-10-CM | POA: Diagnosis present

## 2018-02-25 DIAGNOSIS — D631 Anemia in chronic kidney disease: Secondary | ICD-10-CM | POA: Diagnosis present

## 2018-02-25 DIAGNOSIS — Z992 Dependence on renal dialysis: Secondary | ICD-10-CM

## 2018-02-25 DIAGNOSIS — E8779 Other fluid overload: Principal | ICD-10-CM | POA: Diagnosis present

## 2018-02-25 DIAGNOSIS — Z9049 Acquired absence of other specified parts of digestive tract: Secondary | ICD-10-CM

## 2018-02-25 DIAGNOSIS — Z79899 Other long term (current) drug therapy: Secondary | ICD-10-CM

## 2018-02-25 DIAGNOSIS — G4733 Obstructive sleep apnea (adult) (pediatric): Secondary | ICD-10-CM | POA: Diagnosis present

## 2018-02-25 DIAGNOSIS — E785 Hyperlipidemia, unspecified: Secondary | ICD-10-CM | POA: Diagnosis present

## 2018-02-25 DIAGNOSIS — Z9115 Patient's noncompliance with renal dialysis: Secondary | ICD-10-CM

## 2018-02-25 DIAGNOSIS — F410 Panic disorder [episodic paroxysmal anxiety] without agoraphobia: Secondary | ICD-10-CM | POA: Diagnosis present

## 2018-02-25 DIAGNOSIS — N390 Urinary tract infection, site not specified: Secondary | ICD-10-CM | POA: Diagnosis present

## 2018-02-25 DIAGNOSIS — I10 Essential (primary) hypertension: Secondary | ICD-10-CM | POA: Diagnosis present

## 2018-02-25 DIAGNOSIS — K219 Gastro-esophageal reflux disease without esophagitis: Secondary | ICD-10-CM | POA: Diagnosis present

## 2018-02-25 DIAGNOSIS — E1122 Type 2 diabetes mellitus with diabetic chronic kidney disease: Secondary | ICD-10-CM | POA: Diagnosis present

## 2018-02-25 DIAGNOSIS — D649 Anemia, unspecified: Secondary | ICD-10-CM | POA: Diagnosis present

## 2018-02-25 LAB — URINALYSIS, ROUTINE W REFLEX MICROSCOPIC
BILIRUBIN URINE: NEGATIVE
Glucose, UA: >=500 mg/dL — AB
Ketones, ur: NEGATIVE mg/dL
NITRITE: NEGATIVE
PH: 6 (ref 5.0–8.0)
Protein, ur: 100 mg/dL — AB
SPECIFIC GRAVITY, URINE: 1.015 (ref 1.005–1.030)

## 2018-02-25 LAB — CBC
HCT: 34.5 % — ABNORMAL LOW (ref 36.0–46.0)
Hemoglobin: 11 g/dL — ABNORMAL LOW (ref 12.0–15.0)
MCH: 30 pg (ref 26.0–34.0)
MCHC: 31.9 g/dL (ref 30.0–36.0)
MCV: 94 fL (ref 78.0–100.0)
PLATELETS: 128 10*3/uL — AB (ref 150–400)
RBC: 3.67 MIL/uL — ABNORMAL LOW (ref 3.87–5.11)
RDW: 15.6 % — AB (ref 11.5–15.5)
WBC: 6.2 10*3/uL (ref 4.0–10.5)

## 2018-02-25 LAB — CBG MONITORING, ED
GLUCOSE-CAPILLARY: 187 mg/dL — AB (ref 70–99)
Glucose-Capillary: 294 mg/dL — ABNORMAL HIGH (ref 70–99)

## 2018-02-25 LAB — BASIC METABOLIC PANEL
Anion gap: 11 (ref 5–15)
BUN: 61 mg/dL — AB (ref 6–20)
CO2: 20 mmol/L — ABNORMAL LOW (ref 22–32)
CREATININE: 5.89 mg/dL — AB (ref 0.44–1.00)
Calcium: 9.8 mg/dL (ref 8.9–10.3)
Chloride: 109 mmol/L (ref 98–111)
GFR calc Af Amer: 9 mL/min — ABNORMAL LOW (ref >60)
GFR calc non Af Amer: 8 mL/min — ABNORMAL LOW (ref >60)
Glucose, Bld: 277 mg/dL — ABNORMAL HIGH (ref 70–99)
Potassium: 4.6 mmol/L (ref 3.5–5.1)
SODIUM: 140 mmol/L (ref 135–145)

## 2018-02-25 LAB — TROPONIN I

## 2018-02-25 LAB — BRAIN NATRIURETIC PEPTIDE: B Natriuretic Peptide: 556 pg/mL — ABNORMAL HIGH (ref 0.0–100.0)

## 2018-02-25 MED ORDER — INSULIN ASPART 100 UNIT/ML ~~LOC~~ SOLN
0.0000 [IU] | Freq: Three times a day (TID) | SUBCUTANEOUS | Status: DC
  Administered 2018-02-26: 8 [IU] via SUBCUTANEOUS
  Administered 2018-02-26: 3 [IU] via SUBCUTANEOUS
  Administered 2018-02-26: 5 [IU] via SUBCUTANEOUS
  Administered 2018-02-27: 3 [IU] via SUBCUTANEOUS
  Administered 2018-02-27: 5 [IU] via SUBCUTANEOUS

## 2018-02-25 MED ORDER — ONDANSETRON HCL 4 MG/2ML IJ SOLN: 4.0000 mg | Freq: Four times a day (QID) | INTRAMUSCULAR | Status: DC | PRN

## 2018-02-25 MED ORDER — ONDANSETRON HCL 4 MG PO TABS
4.0000 mg | ORAL_TABLET | Freq: Four times a day (QID) | ORAL | Status: DC | PRN
  Administered 2018-02-27: 4 mg via ORAL
  Filled 2018-02-25: qty 1

## 2018-02-25 MED ORDER — ACETAMINOPHEN 650 MG RE SUPP: 650.0000 mg | Freq: Four times a day (QID) | RECTAL | Status: DC | PRN

## 2018-02-25 MED ORDER — FUROSEMIDE 10 MG/ML IJ SOLN
INTRAMUSCULAR | Status: AC
  Filled 2018-02-25: qty 8

## 2018-02-25 MED ORDER — FUROSEMIDE 10 MG/ML IJ SOLN
80.0000 mg | Freq: Once | INTRAMUSCULAR | Status: AC
  Administered 2018-02-25: 80 mg via INTRAVENOUS

## 2018-02-25 MED ORDER — ACETAMINOPHEN 325 MG PO TABS
650.0000 mg | ORAL_TABLET | Freq: Four times a day (QID) | ORAL | Status: DC | PRN
  Filled 2018-02-25: qty 2

## 2018-02-25 MED ORDER — LORAZEPAM 0.5 MG PO TABS
0.5000 mg | ORAL_TABLET | Freq: Once | ORAL | Status: AC
  Administered 2018-02-25: 0.5 mg via ORAL
  Filled 2018-02-25: qty 1

## 2018-02-25 MED ORDER — ENOXAPARIN SODIUM 30 MG/0.3ML ~~LOC~~ SOLN
30.0000 mg | SUBCUTANEOUS | Status: DC
  Administered 2018-02-25 – 2018-02-26 (×2): 30 mg via SUBCUTANEOUS
  Filled 2018-02-25 (×2): qty 0.3

## 2018-02-25 NOTE — H&P (Signed)
History and Physical    Martha [name] SNK:[id number] DOB: 03/05/1966 DOA: 02/25/2018  PCP: Sinda Du, MD   Patient coming from: Home.  I have personally briefly reviewed patient's old medical records in [place]  Chief Complaint: High blood sugar.  HPI: Martha [name] is a 52 y.o. female with medical history significant of GERD, anemia ESRD, ESRD on hemodialysis, bipolar 1 disorder, left eye blindness and recent enucleation 3 weeks ago, cervical radiculopathy, unspecified congestive heart failure, COPD, thoracic DDD, depression, anxiety, panic attacks, type 2 diabetes, diabetic retinopathy, diabetic neuropathy, hypertension, history of noncompliance with treatment, sleep apnea on CPAP, restless leg syndrome who is coming to the emergency department due to having persistently elevated blood glucose, mostly over 400, associated with nausea, fatigue and malaise since last week.  The patient also states that she missed dialysis on Wednesday and has been drinking a lot of water, feels dyspneic and overloaded like "she has fluid in her chest".  She also mentions that over the past 2 days she has been having frequent shakes and rigors.  She denies fever, productive cough, complains of mild diarrhea, dysuria and frequency for the past few days.  She was admitted in the past due to sepsis secondary to UTI.  She denies sore throat, wheezing, hemoptysis, PND, orthopnea or pitting edema of the lower extremities.  No constipation, melena or hematochezia.  Positive polydipsia, but denies polyphagia.  No pruritus or skin rashes.  ED Course: Initial vital signs temperature 98.2 F, pulse 78, respiration 19, blood pressure 169/93 mmHg and O2 sat 98% on room air.  Her urinalysis shows significant glucosuria, small hemoglobinuria and leukocyte esterase, 100 mg of proteinuria.  Microscopic shows few bacteria, more than 50 squamous epithelial cells, 6-10 RBCs and 11-20 WBC per hpf.  White count 6.2,  hemoglobin 11.0 g/dL and platelets 128.  Sodium 140, potassium 4.6, chloride 109 CO2 20 mmol/L.  Her BUN was 61, creatinine 5.89 and glucose 277 mg/dL.  Troponin was less than 0.03 ng/mL and BNP 556.0 pg/mL.EKG was sinus rhythm with a normal R wave progression and early transition.  Her chest radiograph showed cardiomegaly, but did not show any active cardiopulmonary disease.  Review of Systems: As per HPI otherwise 10 point review of systems negative.   Past Medical History:  Diagnosis Date  . Acid reflux    takes Tums  . Anemia   . Arthritis   . Bipolar 1 disorder ([place])   . Blindness of left eye   . Cervical radiculopathy   . CHF (congestive heart failure) ([place])   . Chronic kidney disease    Stage 5- 01/25/17  . COPD (chronic obstructive pulmonary disease) ([place]) 2014   bronchitis  . Degenerative disc disease, thoracic   . Depression   . Diabetes mellitus    Type II  . Diabetic retinopathy ([place])   . Hypertension   . Noncompliance with medication regimen   . Noncompliance with medication regimen   . Obesity (BMI 30-39.9)   . OSA (obstructive sleep apnea)    cpap  . Panic attack   . RLS (restless legs syndrome)     Past Surgical History:  Procedure Laterality Date  . AV FISTULA PLACEMENT Right 01/27/2017   Procedure: ARTERIOVENOUS (AV) FISTULA CREATION-RIGHT ARM;  Surgeon: Elam Dutch, MD;  Location: [place];  Service: Vascular;  Laterality: Right;  . BIOPSY N/A 05/17/2013   Procedure: BIOPSY;  Surgeon: [name], MD;  [facility];  Service:  Endoscopy;  Laterality: N/A;  . CHOLECYSTECTOMY N/A 04/27/2014   Procedure: LAPAROSCOPIC CHOLECYSTECTOMY;  Surgeon: [name], MD;  Location: [facility];  Service: General;  Laterality: N/A;  . COLONOSCOPY  06/06/2010   QIW:[id number] bleeding secondary to internal hemorrhoids but incomplete evaluation secondary to poor right colon prep/small rectal and sigmoid colon polyps (hyperplastic). PROPOFOL  . COLONOSCOPY  May 2013    Dr. Gilliam/[facility]: 5 mm a descending colon polyp, hyperplastic. Adequate bowel prep.  . ENDOMETRIAL ABLATION    . 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 ENUCLEATION Left 02/04/2018   Procedure: ENUCLEATION WITH PLACEMENT OF IMPLANT LEFT EYE;  Surgeon: Clista Bernhardt, MD;  Location: [place];  Service: Ophthalmology;  Laterality: Left;  . ESOPHAGOGASTRODUODENOSCOPY  06/06/2010   JJH:ERDEYCX erythema and edema of body of stomach, with sessile polypoid lesions. bx benign. no h.pylori  . ESOPHAGOGASTRODUODENOSCOPY (EGD) WITH PROPOFOL N/A 05/17/2013   Dr. Oneida Alar: normal esophagus, moderate nodular gastritis, negative path, empiric Savary dilation  . EYE SURGERY  07/2017   sx for glaucoma  . FLEXIBLE SIGMOIDOSCOPY N/A 05/17/2013   Dr. Oneida Alar: moderate sized internal hemorrhoids  . HEMORRHOID BANDING N/A 05/17/2013   Procedure: HEMORRHOID BANDING;  Surgeon: [name], MD;  [facility];  Service: Endoscopy;  Laterality: N/A;  2 bands placed  . INCISION AND DRAINAGE ABSCESS Left 10/11/2013   Procedure: INCISION AND DRAINAGE AND DEBRIDEMENT LEFT BREAST  ABSCESS;  Surgeon: [name], MD;  [facility];  Service: General;  Laterality: Left;  . IRRIGATION AND DEBRIDEMENT ABSCESS Right 06/01/2013   Procedure: INCISION AND DRAINAGE AND DEBRIDEMENT ABSCESS RIGHT BREAST;  Surgeon: [name], MD;  [facility];  Service: General;  Laterality: Right;  . SAVORY DILATION N/A 05/17/2013   Procedure: SAVORY DILATION;  Surgeon: [name], MD;  [facility];  Service: Endoscopy;  Laterality: N/A;  14/15/16  . TUBAL LIGATION       reports that she quit smoking about 10 years ago. Her smoking use included cigarettes. She has a 37.50 pack-year smoking history. She has never used smokeless tobacco. She reports that she does not drink alcohol or use drugs.  Allergies  Allergen Reactions  . Codeine Nausea And Vomiting  . Tape Other  (See Comments)    Plastic Tape    Family History  Adopted: Yes  Family history unknown: Yes    Prior to Admission medications   Medication Sig Start Date End Date Taking? Authorizing Provider  ALPRAZolam Duanne Moron) 1 MG tablet Take 1 mg by mouth 4 (four) times daily as needed for anxiety (nerves).    [provider]  atorvastatin (LIPITOR) 10 MG tablet Take 20 mg by mouth daily.     [provider]  busPIRone (BUSPAR) 7.5 MG tablet Take 7.5 mg by mouth 2 (two) times daily.    [provider]  calcium carbonate (TUMS EX) 750 MG chewable tablet Chew 1-3 tablets by mouth 6 (six) times daily. Patient takes 3 tablets with meals and 1 tablet with snacks three times a day    [provider]  carvedilol (COREG) 12.5 MG tablet Take 1 tablet (12.5 mg total) by mouth 2 (two) times daily with a meal. 05/26/16   Cherene Altes, MD  colchicine 0.6 MG tablet Take 1 tablet (0.6 mg total) by mouth every other day. 11/19/16   Sinda Du, MD  dicyclomine (BENTYL) 10 MG capsule Take 1 capsule (10 mg  total) by mouth 4 (four) times daily -  before meals and at bedtime. 10/08/17   Annitta Needs, NP  gabapentin (NEURONTIN) 300 MG capsule Take 300 mg by mouth 2 (two) times daily.    [provider]  hydrALAZINE (APRESOLINE) 25 MG tablet Take 1 tablet (25 mg total) by mouth every 8 (eight) hours. Patient taking differently: Take 25 mg by mouth 3 (three) times daily.  10/29/16   Sinda Du, MD  HYDROcodone-acetaminophen (NORCO) 10-325 MG tablet Take 1 tablet by mouth every 6 (six) hours as needed for moderate pain. Patient taking differently: Take 1 tablet by mouth 4 (four) times daily.  01/27/17   Rhyne, Hulen Shouts, PA-C  Insulin Glargine (LANTUS SOLOSTAR) 100 UNIT/ML Solostar Pen Inject 10-25 Units into the skin at bedtime. Sliding scale    [provider]  lidocaine-prilocaine (EMLA) cream Apply 1 application topically as needed (topical anesthesia for  hemodialysis if Gebauers and Lidocaine injection are ineffective.). 04/25/17   Johnson, Clanford L, MD  PARoxetine (PAXIL) 20 MG tablet Take 1 daily. This is to prevent panic attacks Patient taking differently: Take 20-40 mg by mouth daily. This is to prevent panic attacks 10/28/16   Sinda Du, MD  risperiDONE (RISPERDAL) 0.5 MG tablet Take 1 tablet (0.5 mg total) by mouth 2 (two) times daily. 12/01/16   Sinda Du, MD  torsemide (DEMADEX) 20 MG tablet Take 20 mg by mouth daily.    [provider]    Physical Exam: Vitals:   02/25/18 2109 02/25/18 2139 02/25/18 2227 02/25/18 2230  BP: (!) 178/83 (!) 163/74 (!) 170/97 (!) 187/90  Pulse: 83 82 80 82  Resp: 16  15 18   Temp:      TempSrc:      SpO2: 98% 98% 97% 100%  Weight:      Height:        Constitutional: Occasional rigors, but in NAD otherwise, calm, comfortable Eyes: Left eye enucleation, left eye bandage in place.  Right lid and conjunctivae normal ENMT: Mucous membranes are moist. Posterior pharynx clear of any exudate or lesions. Neck: normal, supple, no masses, no thyromegaly Respiratory: clear to auscultation bilaterally, no wheezing, no crackles. Normal respiratory effort. No accessory muscle use.  Cardiovascular: Regular rate and rhythm, no murmurs / rubs / gallops. No extremity edema. 2+ pedal pulses. No carotid bruits. Right AV fistula with positive thrill. Abdomen: Obese, soft, no tenderness, no masses palpated. No hepatosplenomegaly. Bowel sounds positive.  Musculoskeletal: no clubbing / cyanosis.  Good ROM, no contractures. Normal muscle tone.  Skin: no rashes, lesions, ulcers on very limited dermatological examination. Neurologic: Positive tremors.  CN 2-12 grossly intact. Sensation intact, DTR normal. Strength 5/5 in all 4.  Psychiatric: Normal judgment and insight. Alert and oriented x 4. Normal mood.    Labs on Admission: I have personally reviewed following labs and imaging studies  CBC: Recent  Labs  Lab 02/25/18 1824  WBC 6.2  HGB 11.0*  HCT 34.5*  MCV 94.0  PLT 366*   Basic Metabolic Panel: Recent Labs  Lab 02/25/18 1824  NA 140  K 4.6  CL 109  CO2 20*  GLUCOSE 277*  BUN 61*  CREATININE 5.89*  CALCIUM 9.8   GFR: Estimated Creatinine Clearance: 17.6 mL/min (A) (by C-G formula based on SCr of 5.89 mg/dL (H)). Liver Function Tests: No results for input(s): AST, ALT, ALKPHOS, BILITOT, PROT, ALBUMIN in the last 168 hours. No results for input(s): LIPASE, AMYLASE in the last 168 hours. No  results for input(s): AMMONIA in the last 168 hours. Coagulation Profile: No results for input(s): INR, PROTIME in the last 168 hours. Cardiac Enzymes: Recent Labs  Lab 02/25/18 2019  TROPONINI <0.03   BNP (last 3 results) No results for input(s): PROBNP in the last 8760 hours. HbA1C: No results for input(s): HGBA1C in the last 72 hours. CBG: Recent Labs  Lab 02/25/18 1715 02/25/18 2019  GLUCAP 294* 187*   Lipid Profile: No results for input(s): CHOL, HDL, LDLCALC, TRIG, CHOLHDL, LDLDIRECT in the last 72 hours. Thyroid Function Tests: No results for input(s): TSH, T4TOTAL, FREET4, T3FREE, THYROIDAB in the last 72 hours. Anemia Panel: No results for input(s): VITAMINB12, FOLATE, FERRITIN, TIBC, IRON, RETICCTPCT in the last 72 hours. Urine analysis:    Component Value Date/Time   COLORURINE YELLOW 02/25/2018 1713   APPEARANCEUR TURBID (A) 02/25/2018 1713   LABSPEC 1.015 02/25/2018 1713   PHURINE 6.0 02/25/2018 1713   GLUCOSEU >=500 (A) 02/25/2018 1713   HGBUR SMALL (A) 02/25/2018 1713   BILIRUBINUR NEGATIVE 02/25/2018 1713   KETONESUR NEGATIVE 02/25/2018 1713   PROTEINUR 100 (A) 02/25/2018 1713   UROBILINOGEN 0.2 03/17/2015 2236   NITRITE NEGATIVE 02/25/2018 1713   LEUKOCYTESUR SMALL (A) 02/25/2018 1713    Radiological Exams on Admission: Dg Chest 2 View  Result Date: 02/25/2018 CLINICAL DATA:  Short of breath with nausea EXAM: CHEST - 2 VIEW COMPARISON:   12/13/2017 FINDINGS: Mild cardiomegaly. No acute consolidation or effusion. No pneumothorax. IMPRESSION: No active cardiopulmonary disease.  Cardiomegaly Electronically Signed   By: Donavan Foil M.D.   On: 02/25/2018 21:50    EKG: Independently reviewed. Vent. rate 80 BPM PR interval * ms QRS duration 81 ms QT/QTc 355/410 ms P-R-T axes 51 44 49 Sinus rhythm Low voltage, precordial leads Abnormal R-wave progression, early transition  Assessment/Plan Principal Problem:   Volume overload Secondary to missing Wednesday dialysis and increased water intake. Observation/telemetry. Furosemide 80 mg IVP given earlier. Continue supplemental oxygen. Continue CPAP at bedtime. Consult nephrology for hemodialysis in the morning.  Active Problems:   ESRD (end stage renal disease) on dialysis ([place]) Missed dialysis 2 days ago. Consult nephrology.    UTI (urinary tract infection) She had a serious episodes of urosepsis at the end of last year. She is currently complaining of rigors with no other specific etiology. I will obtain a urine sample for culture and start ceftriaxone.    Hyperlipidemia Continue atorvastatin 20 mg p.o. daily. Check LFTs as needed. Fasting lipid follow-up as an outpatient.    Depression Continue paroxetine 20 mg p.o. daily. Continue alprazolam 1 mg p.o. 4 times daily as needed for anxiety. Continue Risperdal 0.5 mg p.o. twice daily.    Essential hypertension Continue carvedilol 12.5 mg p.o. twice daily. Continue hydralazine 25 mg p.o. 3 times a day. Continue torsemide 20 mg p.o. daily. Monitor blood pressure, heart rate, renal function and electrolytes.    GERD Protonix 40 mg p.o. daily.    Sleep apnea Continue CPAP at bedtime.    Anemia Monitor hematocrit and hemoglobin. Erythropoietin per nephrology.    Diabetes mellitus, type 2 ([place]) Higher levels than usual in the past 2 days per patient. Carbohydrate modified diet. Continue Lantus at  bedtime. CBG monitoring with regular insulin sliding scale.     DVT prophylaxis: Lovenox SQ. Code Status: Full code. Family Communication: Disposition Plan: Observation for volume overload treatment and HD in the morning. Consults called: Routine nephrology consult. Admission status: Observation/telemetry.   Reubin Milan MD [facility] Pager [phone].  If 7PM-7AM, please contact night-coverage [email or web address] Password [facility]  02/25/2018, 10:49 PM

## 2018-02-25 NOTE — ED Provider Notes (Signed)
[facility] Emergency Department Provider Note MRN:  [id number]  Arrival date & time: 02/25/18     Chief Complaint   Hyperglycemia   History of Present Illness   Martha [name] is a 52 y.o. year-old female with a history of diabetes, COPD, CHF, CKD on dialysis presenting to the ED with chief complaint of hyperglycemia and shortness of breath.  Patient endorsing general malaise for the past 2 days.  Mild nausea.  Feels this way when her glucose is too high.  Yesterday her glucose was 500.  She became occupied by this and was dosing her insulin and missed her dialysis yesterday.  Last received dialysis on Monday.  Today continuing to experience high blood glucose now beginning to feel short of breath, which she describes as feeling as if her lungs are full of fluid.  This is happened in the past.  Patient denies fevers, no cough, no chest pain, no abdominal pain, no dysuria, no rashes.  Review of Systems  A complete 10 system review of systems was obtained and all systems are negative except as noted in the HPI and PMH.   Patient's Health History    Past Medical History:  Diagnosis Date  . Acid reflux    takes Tums  . Anemia   . Arthritis   . Bipolar 1 disorder ([place])   . Blindness of left eye   . Cervical radiculopathy   . CHF (congestive heart failure) ([place])   . Chronic kidney disease    Stage 5- 01/25/17  . COPD (chronic obstructive pulmonary disease) ([place]) 2014   bronchitis  . Degenerative disc disease, thoracic   . Depression   . Diabetes mellitus    Type II  . Diabetic retinopathy ([place])   . Hypertension   . Noncompliance with medication regimen   . Noncompliance with medication regimen   . Obesity (BMI 30-39.9)   . OSA (obstructive sleep apnea)    cpap  . Panic attack   . RLS (restless legs syndrome)     Past Surgical History:  Procedure Laterality Date  . AV FISTULA PLACEMENT Right 01/27/2017   Procedure: ARTERIOVENOUS (AV) FISTULA CREATION-RIGHT  ARM;  Surgeon: Elam Dutch, MD;  Location: [place];  Service: Vascular;  Laterality: Right;  . BIOPSY N/A 05/17/2013   Procedure: BIOPSY;  Surgeon: [name], MD;  [facility];  Service: Endoscopy;  Laterality: N/A;  . CHOLECYSTECTOMY N/A 04/27/2014   Procedure: LAPAROSCOPIC CHOLECYSTECTOMY;  Surgeon: [name], MD;  [facility];  Service: General;  Laterality: N/A;  . COLONOSCOPY  06/06/2010   [id number] bleeding secondary to internal hemorrhoids but incomplete evaluation secondary to poor right colon prep/small rectal and sigmoid colon polyps (hyperplastic). PROPOFOL  . COLONOSCOPY  May 2013   Dr. Gilliam/[facility]: 5 mm a descending colon polyp, hyperplastic. Adequate bowel prep.  . ENDOMETRIAL ABLATION    . 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 ENUCLEATION Left 02/04/2018   Procedure: ENUCLEATION WITH PLACEMENT OF IMPLANT LEFT EYE;  Surgeon: Clista Bernhardt, MD;  Location: [place];  Service: Ophthalmology;  Laterality: Left;  . ESOPHAGOGASTRODUODENOSCOPY  06/06/2010   YIR:SWNIOEV erythema and edema of body of stomach, with sessile polypoid lesions. bx benign. no h.pylori  . ESOPHAGOGASTRODUODENOSCOPY (EGD) WITH PROPOFOL N/A 05/17/2013   Dr. Oneida Alar: normal esophagus, moderate nodular gastritis, negative path, empiric Savary dilation  . EYE SURGERY  07/2017   sx for glaucoma  . FLEXIBLE  SIGMOIDOSCOPY N/A 05/17/2013   Dr. Oneida Alar: moderate sized internal hemorrhoids  . HEMORRHOID BANDING N/A 05/17/2013   Procedure: HEMORRHOID BANDING;  Surgeon: [name], MD;  [facility];  Service: Endoscopy;  Laterality: N/A;  2 bands placed  . INCISION AND DRAINAGE ABSCESS Left 10/11/2013   Procedure: INCISION AND DRAINAGE AND DEBRIDEMENT LEFT BREAST  ABSCESS;  Surgeon: [name], MD;  [facility];  Service: General;  Laterality: Left;  . IRRIGATION AND DEBRIDEMENT ABSCESS Right 06/01/2013   Procedure: INCISION AND  DRAINAGE AND DEBRIDEMENT ABSCESS RIGHT BREAST;  Surgeon: [name], MD;  [facility];  Service: General;  Laterality: Right;  . SAVORY DILATION N/A 05/17/2013   Procedure: SAVORY DILATION;  Surgeon: [name], MD;  [facility];  Service: Endoscopy;  Laterality: N/A;  14/15/16  . TUBAL LIGATION      Family History  Adopted: Yes  Family history unknown: Yes    Social History   Socioeconomic History  . Marital status: Divorced    Spouse name: Not on file  . Number of children: 2  . Years of education: Not on file  . Highest education level: Not on file  Occupational History  . Occupation: Disabled  Social Needs  . Financial resource strain: Not on file  . Food insecurity:    Worry: Not on file    Inability: Not on file  . Transportation needs:    Medical: Not on file    Non-medical: Not on file  Tobacco Use  . Smoking status: Former Smoker    Packs/day: 1.50    Years: 25.00    Pack years: 37.50    Types: Cigarettes    Last attempt to quit: 10/23/2007    Years since quitting: 10.3  . Smokeless tobacco: Never Used  . Tobacco comment: Quit x 10 years ago  Substance and Sexual Activity  . Alcohol use: No  . Drug use: No  . Sexual activity: Yes    Birth control/protection: Surgical  Lifestyle  . Physical activity:    Days per week: Not on file    Minutes per session: Not on file  . Stress: Not on file  Relationships  . Social connections:    Talks on phone: Not on file    Gets together: Not on file    Attends religious service: Not on file    Active member of club or organization: Not on file    Attends meetings of clubs or organizations: Not on file    Relationship status: Not on file  . Intimate partner violence:    Fear of current or ex partner: Not on file    Emotionally abused: Not on file    Physically abused: Not on file    Forced sexual activity: Not on file  Other Topics Concern  . Not on file  Social History Narrative   Lives  with fiance.       Physical Exam  Vital Signs and Nursing Notes reviewed Vitals:   02/25/18 2227 02/25/18 2230  BP: (!) 170/97 (!) 187/90  Pulse: 80 82  Resp: 15 18  Temp:    SpO2: 97% 100%    CONSTITUTIONAL: Chronically ill-appearing, NAD NEURO:  Alert and oriented x 3, no focal deficits EYES: Right eye equal and reactive, left eye with dressing in place ENT/NECK:  no LAD, no JVD CARDIO: Regular rate, well-perfused, normal S1 and S2 PULM:  CTAB no wheezing or rhonchi GI/GU:  normal bowel sounds,  non-distended, non-tender MSK/SPINE:  No gross deformities, no edema SKIN:  no rash, atraumatic PSYCH:  Appropriate speech and behavior  Diagnostic and Interventional Summary    EKG Interpretation  Date/Time:    Ventricular Rate:    PR Interval:    QRS Duration:   QT Interval:    QTC Calculation:   R Axis:     Text Interpretation:        Labs Reviewed  BASIC METABOLIC PANEL - Abnormal; Notable for the following components:      Result Value   CO2 20 (*)    Glucose, Bld 277 (*)    BUN 61 (*)    Creatinine, Ser 5.89 (*)    GFR calc non Af Amer 8 (*)    GFR calc Af Amer 9 (*)    All other components within normal limits  CBC - Abnormal; Notable for the following components:   RBC 3.67 (*)    Hemoglobin 11.0 (*)    HCT 34.5 (*)    RDW 15.6 (*)    Platelets 128 (*)    All other components within normal limits  URINALYSIS, ROUTINE W REFLEX MICROSCOPIC - Abnormal; Notable for the following components:   APPearance TURBID (*)    Glucose, UA >=500 (*)    Hgb urine dipstick SMALL (*)    Protein, ur 100 (*)    Leukocytes, UA SMALL (*)    Bacteria, UA FEW (*)    Squamous Epithelial / LPF >50 (*)    All other components within normal limits  BRAIN NATRIURETIC PEPTIDE - Abnormal; Notable for the following components:   B Natriuretic Peptide 556.0 (*)    All other components within normal limits  CBG MONITORING, ED - Abnormal; Notable for the following components:    Glucose-Capillary 294 (*)    All other components within normal limits  CBG MONITORING, ED - Abnormal; Notable for the following components:   Glucose-Capillary 187 (*)    All other components within normal limits  TROPONIN I  CBC  COMPREHENSIVE METABOLIC PANEL    DG Chest 2 View  Final Result      Medications  enoxaparin (LOVENOX) injection 30 mg (30 mg Subcutaneous Given 02/25/18 2237)  ondansetron (ZOFRAN) tablet 4 mg (has no administration in time range)    Or  ondansetron (ZOFRAN) injection 4 mg (has no administration in time range)  acetaminophen (TYLENOL) tablet 650 mg (has no administration in time range)    Or  acetaminophen (TYLENOL) suppository 650 mg (has no administration in time range)  insulin aspart (novoLOG) injection 0-15 Units (has no administration in time range)  LORazepam (ATIVAN) tablet 0.5 mg (0.5 mg Oral Given 02/25/18 2100)  furosemide (LASIX) injection 80 mg (80 mg Intravenous Given 02/25/18 2231)     Procedures Critical Care  ED Course and Medical Decision Making  I have reviewed the triage vital signs and the nursing notes.  Pertinent labs & imaging results that were available during my care of the patient were reviewed by me and considered in my medical decision making (see below for details). Clinical Course as of Feb 26 2251  Thu Feb 25, 5858  6245 53 year old female history of ESRD, diabetes here with sensation of fluid overload in the setting of missed dialysis session yesterday.  Also with hyperglycemia of unknown etiology, denies infectious symptoms, afebrile here, vital signs stable.  Abdomen soft.  About 3 weeks postop from left eye enucleation, well-appearing site, pain and swelling improving per patient.  Anticipating  admission for dialysis.   [MB]    Clinical Course User Index [MB] Maudie Flakes, MD    Admitted to hospital service for dialysis and glucose monitoring.  Barth Kirks. Sedonia Small, MD [place] [email or web address]  Final Clinical Impressions(s) / ED Diagnoses     ICD-10-CM   1. Hypervolemia, unspecified hypervolemia type E87.70   2. SOB (shortness of breath) R06.02 DG Chest 2 View    DG Chest 2 View  3. Hyperglycemia R73.9     ED Discharge Orders    None         Maudie Flakes, MD 02/25/18 2252

## 2018-02-25 NOTE — ED Triage Notes (Signed)
Pt states blood sugar high all week >400. Pt takes dialysis and last time was Monday. Pt c/o nausea and feeling bad. Nad.

## 2018-02-26 DIAGNOSIS — E669 Obesity, unspecified: Secondary | ICD-10-CM | POA: Diagnosis present

## 2018-02-26 DIAGNOSIS — E1165 Type 2 diabetes mellitus with hyperglycemia: Secondary | ICD-10-CM

## 2018-02-26 DIAGNOSIS — I10 Essential (primary) hypertension: Secondary | ICD-10-CM

## 2018-02-26 DIAGNOSIS — E8779 Other fluid overload: Secondary | ICD-10-CM | POA: Diagnosis present

## 2018-02-26 DIAGNOSIS — H5462 Unqualified visual loss, left eye, normal vision right eye: Secondary | ICD-10-CM | POA: Diagnosis present

## 2018-02-26 DIAGNOSIS — N186 End stage renal disease: Secondary | ICD-10-CM

## 2018-02-26 DIAGNOSIS — G4733 Obstructive sleep apnea (adult) (pediatric): Secondary | ICD-10-CM

## 2018-02-26 DIAGNOSIS — J449 Chronic obstructive pulmonary disease, unspecified: Secondary | ICD-10-CM | POA: Diagnosis present

## 2018-02-26 DIAGNOSIS — E1140 Type 2 diabetes mellitus with diabetic neuropathy, unspecified: Secondary | ICD-10-CM | POA: Diagnosis present

## 2018-02-26 DIAGNOSIS — Z87891 Personal history of nicotine dependence: Secondary | ICD-10-CM | POA: Diagnosis not present

## 2018-02-26 DIAGNOSIS — K219 Gastro-esophageal reflux disease without esophagitis: Secondary | ICD-10-CM | POA: Diagnosis present

## 2018-02-26 DIAGNOSIS — E11319 Type 2 diabetes mellitus with unspecified diabetic retinopathy without macular edema: Secondary | ICD-10-CM | POA: Diagnosis present

## 2018-02-26 DIAGNOSIS — Z992 Dependence on renal dialysis: Secondary | ICD-10-CM | POA: Diagnosis not present

## 2018-02-26 DIAGNOSIS — E1122 Type 2 diabetes mellitus with diabetic chronic kidney disease: Secondary | ICD-10-CM | POA: Diagnosis present

## 2018-02-26 DIAGNOSIS — I120 Hypertensive chronic kidney disease with stage 5 chronic kidney disease or end stage renal disease: Secondary | ICD-10-CM | POA: Diagnosis present

## 2018-02-26 DIAGNOSIS — E8770 Fluid overload, unspecified: Secondary | ICD-10-CM | POA: Diagnosis not present

## 2018-02-26 DIAGNOSIS — G2581 Restless legs syndrome: Secondary | ICD-10-CM | POA: Diagnosis present

## 2018-02-26 DIAGNOSIS — D631 Anemia in chronic kidney disease: Secondary | ICD-10-CM | POA: Diagnosis present

## 2018-02-26 DIAGNOSIS — Z79899 Other long term (current) drug therapy: Secondary | ICD-10-CM | POA: Diagnosis not present

## 2018-02-26 DIAGNOSIS — N390 Urinary tract infection, site not specified: Secondary | ICD-10-CM | POA: Diagnosis present

## 2018-02-26 DIAGNOSIS — R0602 Shortness of breath: Secondary | ICD-10-CM | POA: Diagnosis not present

## 2018-02-26 DIAGNOSIS — Z9115 Patient's noncompliance with renal dialysis: Secondary | ICD-10-CM | POA: Diagnosis not present

## 2018-02-26 DIAGNOSIS — Z9049 Acquired absence of other specified parts of digestive tract: Secondary | ICD-10-CM | POA: Diagnosis not present

## 2018-02-26 DIAGNOSIS — F410 Panic disorder [episodic paroxysmal anxiety] without agoraphobia: Secondary | ICD-10-CM | POA: Diagnosis present

## 2018-02-26 DIAGNOSIS — F319 Bipolar disorder, unspecified: Secondary | ICD-10-CM | POA: Diagnosis present

## 2018-02-26 DIAGNOSIS — Z794 Long term (current) use of insulin: Secondary | ICD-10-CM | POA: Diagnosis not present

## 2018-02-26 DIAGNOSIS — E782 Mixed hyperlipidemia: Secondary | ICD-10-CM

## 2018-02-26 DIAGNOSIS — E785 Hyperlipidemia, unspecified: Secondary | ICD-10-CM | POA: Diagnosis present

## 2018-02-26 LAB — GLUCOSE, CAPILLARY
GLUCOSE-CAPILLARY: 210 mg/dL — AB (ref 70–99)
Glucose-Capillary: 263 mg/dL — ABNORMAL HIGH (ref 70–99)
Glucose-Capillary: 185 mg/dL — ABNORMAL HIGH (ref 70–99)
Glucose-Capillary: 146 mg/dL — ABNORMAL HIGH (ref 70–99)

## 2018-02-26 LAB — COMPREHENSIVE METABOLIC PANEL
ALT: 13 U/L (ref 0–44)
AST: 10 U/L — ABNORMAL LOW (ref 15–41)
Albumin: 3.1 g/dL — ABNORMAL LOW (ref 3.5–5.0)
Alkaline Phosphatase: 65 U/L (ref 38–126)
Anion gap: 10 (ref 5–15)
BUN: 63 mg/dL — ABNORMAL HIGH (ref 6–20)
CHLORIDE: 108 mmol/L (ref 98–111)
CO2: 22 mmol/L (ref 22–32)
CREATININE: 6.24 mg/dL — AB (ref 0.44–1.00)
Calcium: 9 mg/dL (ref 8.9–10.3)
GFR calc Af Amer: 8 mL/min — ABNORMAL LOW (ref >60)
GFR, EST NON AFRICAN AMERICAN: 7 mL/min — AB (ref >60)
Glucose, Bld: 245 mg/dL — ABNORMAL HIGH (ref 70–99)
POTASSIUM: 4.7 mmol/L (ref 3.5–5.1)
Sodium: 140 mmol/L (ref 135–145)
Total Bilirubin: 0.7 mg/dL (ref 0.3–1.2)
Total Protein: 6.1 g/dL — ABNORMAL LOW (ref 6.5–8.1)

## 2018-02-26 LAB — CBC
HCT: 30.3 % — ABNORMAL LOW (ref 36.0–46.0)
Hemoglobin: 9.6 g/dL — ABNORMAL LOW (ref 12.0–15.0)
MCH: 29.5 pg (ref 26.0–34.0)
MCHC: 31.7 g/dL (ref 30.0–36.0)
MCV: 93.2 fL (ref 78.0–100.0)
PLATELETS: 115 10*3/uL — AB (ref 150–400)
RBC: 3.25 MIL/uL — ABNORMAL LOW (ref 3.87–5.11)
RDW: 15.6 % — AB (ref 11.5–15.5)
WBC: 6.1 10*3/uL (ref 4.0–10.5)

## 2018-02-26 MED ORDER — LIDOCAINE HCL (PF) 1 % IJ SOLN: 5.0000 mL | INTRAMUSCULAR | Status: DC | PRN

## 2018-02-26 MED ORDER — EPOETIN ALFA 3000 UNIT/ML IJ SOLN: 3000.0000 [IU] | INTRAMUSCULAR | Status: DC

## 2018-02-26 MED ORDER — GABAPENTIN 300 MG PO CAPS
300.0000 mg | ORAL_CAPSULE | Freq: Two times a day (BID) | ORAL | Status: DC
  Administered 2018-02-26 – 2018-02-27 (×3): 300 mg via ORAL
  Filled 2018-02-26 (×3): qty 1

## 2018-02-26 MED ORDER — BUSPIRONE HCL 5 MG PO TABS
7.5000 mg | ORAL_TABLET | Freq: Two times a day (BID) | ORAL | Status: DC
  Administered 2018-02-26 – 2018-02-27 (×3): 7.5 mg via ORAL
  Filled 2018-02-26 (×3): qty 2

## 2018-02-26 MED ORDER — HYDROCODONE-ACETAMINOPHEN 10-325 MG PO TABS
1.0000 | ORAL_TABLET | Freq: Four times a day (QID) | ORAL | Status: DC | PRN
  Administered 2018-02-26 (×2): 1 via ORAL
  Filled 2018-02-26 (×2): qty 1

## 2018-02-26 MED ORDER — TORSEMIDE 20 MG PO TABS
20.0000 mg | ORAL_TABLET | Freq: Every day | ORAL | Status: DC
  Administered 2018-02-26 – 2018-02-27 (×2): 20 mg via ORAL
  Filled 2018-02-26 (×2): qty 1

## 2018-02-26 MED ORDER — PAROXETINE HCL 20 MG PO TABS
20.0000 mg | ORAL_TABLET | Freq: Every day | ORAL | Status: DC
  Administered 2018-02-26 – 2018-02-27 (×2): 20 mg via ORAL
  Filled 2018-02-26 (×2): qty 1

## 2018-02-26 MED ORDER — COLCHICINE 0.6 MG PO TABS
0.6000 mg | ORAL_TABLET | ORAL | Status: DC
  Administered 2018-02-26: 0.6 mg via ORAL
  Filled 2018-02-26: qty 1

## 2018-02-26 MED ORDER — SODIUM CHLORIDE 0.9 % IV SOLN: 100.0000 mL | INTRAVENOUS | Status: DC | PRN

## 2018-02-26 MED ORDER — EPOETIN ALFA 4000 UNIT/ML IJ SOLN
INTRAMUSCULAR | Status: AC
  Administered 2018-02-26: 3000 [IU]
  Filled 2018-02-26: qty 1

## 2018-02-26 MED ORDER — SODIUM CHLORIDE 0.9 % IV SOLN
2.0000 g | INTRAVENOUS | Status: DC
  Administered 2018-02-26 – 2018-02-27 (×2): 2 g via INTRAVENOUS
  Filled 2018-02-26 (×3): qty 20
  Filled 2018-02-26: qty 2
  Filled 2018-02-26: qty 20

## 2018-02-26 MED ORDER — CHLORHEXIDINE GLUCONATE CLOTH 2 % EX PADS
6.0000 | MEDICATED_PAD | Freq: Every day | CUTANEOUS | Status: DC
  Administered 2018-02-27: 6 via TOPICAL

## 2018-02-26 MED ORDER — ATORVASTATIN CALCIUM 20 MG PO TABS
20.0000 mg | ORAL_TABLET | Freq: Every day | ORAL | Status: DC
  Administered 2018-02-26 – 2018-02-27 (×2): 20 mg via ORAL
  Filled 2018-02-26 (×2): qty 1

## 2018-02-26 MED ORDER — DICYCLOMINE HCL 10 MG PO CAPS
10.0000 mg | ORAL_CAPSULE | Freq: Three times a day (TID) | ORAL | Status: DC
  Administered 2018-02-26 – 2018-02-27 (×5): 10 mg via ORAL
  Filled 2018-02-26 (×6): qty 1

## 2018-02-26 MED ORDER — CALCIUM CARBONATE ANTACID 500 MG PO CHEW
1.0000 | CHEWABLE_TABLET | Freq: Every day | ORAL | Status: DC
Start: 2018-02-26 — End: 2018-02-27
  Administered 2018-02-26: 600 mg via ORAL
  Administered 2018-02-26: 200 mg via ORAL
  Administered 2018-02-26: 600 mg via ORAL
  Administered 2018-02-27: 200 mg via ORAL
  Administered 2018-02-27 (×2): 600 mg via ORAL
  Filled 2018-02-26: qty 1
  Filled 2018-02-26 (×25): qty 3
  Filled 2018-02-26: qty 1
  Filled 2018-02-26 (×2): qty 3

## 2018-02-26 MED ORDER — INSULIN GLARGINE 100 UNIT/ML ~~LOC~~ SOLN
15.0000 [IU] | Freq: Every day | SUBCUTANEOUS | Status: DC
  Administered 2018-02-26: 15 [IU] via SUBCUTANEOUS
  Filled 2018-02-26 (×3): qty 0.15

## 2018-02-26 MED ORDER — HYDRALAZINE HCL 25 MG PO TABS
25.0000 mg | ORAL_TABLET | Freq: Three times a day (TID) | ORAL | Status: DC
  Administered 2018-02-26 – 2018-02-27 (×2): 25 mg via ORAL
  Filled 2018-02-26 (×3): qty 1

## 2018-02-26 MED ORDER — CARVEDILOL 12.5 MG PO TABS
12.5000 mg | ORAL_TABLET | Freq: Two times a day (BID) | ORAL | Status: DC
  Administered 2018-02-27: 12.5 mg via ORAL
  Filled 2018-02-26 (×2): qty 1
  Filled 2018-02-26: qty 4

## 2018-02-26 MED ORDER — ALPRAZOLAM 1 MG PO TABS: 1.0000 mg | ORAL_TABLET | Freq: Four times a day (QID) | ORAL | Status: DC | PRN

## 2018-02-26 MED ORDER — LIDOCAINE-PRILOCAINE 2.5-2.5 % EX CREA: 1.0000 "application " | TOPICAL_CREAM | CUTANEOUS | Status: DC | PRN

## 2018-02-26 MED ORDER — INSULIN GLARGINE 100 UNIT/ML SOLOSTAR PEN: 10.0000 [IU] | PEN_INJECTOR | Freq: Every day | SUBCUTANEOUS | Status: DC

## 2018-02-26 MED ORDER — RISPERIDONE 0.5 MG PO TABS
0.5000 mg | ORAL_TABLET | Freq: Two times a day (BID) | ORAL | Status: DC
  Administered 2018-02-26 – 2018-02-27 (×3): 0.5 mg via ORAL
  Filled 2018-02-26 (×3): qty 1

## 2018-02-26 MED ORDER — PENTAFLUOROPROP-TETRAFLUOROETH EX AERO: 1.0000 "application " | INHALATION_SPRAY | CUTANEOUS | Status: DC | PRN

## 2018-02-26 NOTE — Progress Notes (Signed)
PROGRESS NOTE    Martha [name]  RKY:[id number] DOB: 07/24/1966 DOA: 02/25/2018 PCP: Sinda Du, MD    Brief Narrative:  52 year old female with a history of end-stage renal disease on hemodialysis, diabetes, hypertension, and multiple other medical problems, was admitted to the hospital with elevated blood sugar and shortness of breath related to noncompliance with dialysis.  She was also found to have a urinary tract infection and started on intravenous antibiotics.  She is being seen by nephrology and undergoing dialysis.  Blood sugars appear to be stabilizing.   Assessment & Plan:   Principal Problem:   Volume overload Active Problems:   Hyperlipidemia   Depression   Essential hypertension   GERD   Sleep apnea   Anemia   Diabetes mellitus, type 2 (HCC)   ESRD (end stage renal disease) on dialysis ([place])   UTI (urinary tract infection)   1. End-stage renal disease on hemodialysis.  Patient has been noncompliant with her dialysis.  She feels that she is volume overloaded.  She does not have any edema on chest x-ray and no peripheral edema.  Nephrology has been consulted and she will undergo dialysis today. 2. Uncontrolled diabetes.  Patient reports that her blood sugars have been running in the 400 range.  She does take Lantus.  Since admission, blood sugars have been running in the 180s to 250 range.  Continue to monitor.  I suspect that she is noncompliant with her diet. 3. Urinary tract infection.  Started on ceftriaxone.  Follow-up culture. 4. Edema.  Continue statin 5. Depression.  Continue on Paxil, Risperdal and Xanax for anxiety 6. Hypertension.  Continue on carvedilol, hydralazine and torsemide. 7. GERD.  Continue on Protonix 8. Obstructive sleep apnea.  Continue CPAP at bedtime 9. Anemia and chronic kidney disease.  Hemoglobin is currently stable.  Continue to monitor.   DVT prophylaxis: Lovenox Code Status: Full code Family Communication: No family  present Disposition Plan: Discharge home once improved   Consultants:   Nephrology  Procedures:     Antimicrobials:   Ceftriaxone 8/1 >   Subjective: Feeling better since admission.  Feels mildly short of breath.  No chest pain.  Objective: Vitals:   02/26/18 1710 02/26/18 1730 02/26/18 1800 02/26/18 1830  BP: (!) 162/86 (!) 170/90 (!) 160/78 (!) 163/75  Pulse: 92 89 83 85  Resp:      Temp:      TempSrc:      SpO2:      Weight:      Height:        Intake/Output Summary (Last 24 hours) at 02/26/2018 1843 Last data filed at 02/26/2018 0920 Gross per 24 hour  Intake 100 ml  Output 700 ml  Net -600 ml   Filed Weights   02/26/18 0036 02/26/18 0607 02/26/18 1700  Weight: (!) 151.1 kg (333 lb 1.8 oz) (!) 150.8 kg (332 lb 8 oz) (!) 150.9 kg (332 lb 10.8 oz)    Examination:  General exam: Appears calm and comfortable  Respiratory system: Crackles. Respiratory effort normal. Cardiovascular system: S1 & S2 heard, RRR. No JVD, murmurs, rubs, gallops or clicks. No pedal edema. Gastrointestinal system: Abdomen is nondistended, soft and nontender. No organomegaly or masses felt. Normal bowel sounds heard. Central nervous system: Alert and oriented. No focal neurological deficits. Extremities: Symmetric 5 x 5 power. Skin: No rashes, lesions or ulcers Psychiatry: Judgement and insight appear normal. Mood & affect appropriate.     Data Reviewed: I have personally reviewed following  labs and imaging studies  CBC: Recent Labs  Lab 02/25/18 1824 02/26/18 0430  WBC 6.2 6.1  HGB 11.0* 9.6*  HCT 34.5* 30.3*  MCV 94.0 93.2  PLT 128* 812*   Basic Metabolic Panel: Recent Labs  Lab 02/25/18 1824 02/26/18 0430  NA 140 140  K 4.6 4.7  CL 109 108  CO2 20* 22  GLUCOSE 277* 245*  BUN 61* 63*  CREATININE 5.89* 6.24*  CALCIUM 9.8 9.0   GFR: Estimated Creatinine Clearance: 17.7 mL/min (A) (by C-G formula based on SCr of 6.24 mg/dL (H)). Liver Function Tests: Recent  Labs  Lab 02/26/18 0430  AST 10*  ALT 13  ALKPHOS 65  BILITOT 0.7  PROT 6.1*  ALBUMIN 3.1*   No results for input(s): LIPASE, AMYLASE in the last 168 hours. No results for input(s): AMMONIA in the last 168 hours. Coagulation Profile: No results for input(s): INR, PROTIME in the last 168 hours. Cardiac Enzymes: Recent Labs  Lab 02/25/18 2019  TROPONINI <0.03   BNP (last 3 results) No results for input(s): PROBNP in the last 8760 hours. HbA1C: No results for input(s): HGBA1C in the last 72 hours. CBG: Recent Labs  Lab 02/25/18 1715 02/25/18 2019 02/26/18 0622 02/26/18 1120 02/26/18 1617  GLUCAP 294* 187* 210* 263* 185*   Lipid Profile: No results for input(s): CHOL, HDL, LDLCALC, TRIG, CHOLHDL, LDLDIRECT in the last 72 hours. Thyroid Function Tests: No results for input(s): TSH, T4TOTAL, FREET4, T3FREE, THYROIDAB in the last 72 hours. Anemia Panel: No results for input(s): VITAMINB12, FOLATE, FERRITIN, TIBC, IRON, RETICCTPCT in the last 72 hours. Sepsis Labs: No results for input(s): PROCALCITON, LATICACIDVEN in the last 168 hours.  No results found for this or any previous visit (from the past 240 hour(s)).       Radiology Studies: Dg Chest 2 View  Result Date: 02/25/2018 CLINICAL DATA:  Short of breath with nausea EXAM: CHEST - 2 VIEW COMPARISON:  12/13/2017 FINDINGS: Mild cardiomegaly. No acute consolidation or effusion. No pneumothorax. IMPRESSION: No active cardiopulmonary disease.  Cardiomegaly Electronically Signed   By: Donavan Foil M.D.   On: 02/25/2018 21:50        Scheduled Meds: . atorvastatin  20 mg Oral Daily  . busPIRone  7.5 mg Oral BID  . calcium carbonate  1-3 tablet Oral 6 X Daily  . carvedilol  12.5 mg Oral BID WC  . Chlorhexidine Gluconate Cloth  6 each Topical Q0600  . colchicine  0.6 mg Oral Q48H  . dicyclomine  10 mg Oral TID AC & HS  . enoxaparin (LOVENOX) injection  30 mg Subcutaneous Q24H  . epoetin (EPOGEN/PROCRIT)  injection  3,000 Units Intravenous Q M,W,F-HD  . gabapentin  300 mg Oral BID  . hydrALAZINE  25 mg Oral TID  . insulin aspart  0-15 Units Subcutaneous TID WC  . insulin glargine  15 Units Subcutaneous QHS  . PARoxetine  20 mg Oral Daily  . risperiDONE  0.5 mg Oral BID  . torsemide  20 mg Oral Daily   Continuous Infusions: . sodium chloride    . sodium chloride    . cefTRIAXone (ROCEPHIN)  IV Stopped (02/26/18 0731)     LOS: 0 days    Time spent: 30 minutes    Kathie Dike, MD [facility] Pager [phone]  If 7PM-7AM, please contact night-coverage [email or web address] Password [facility] 02/26/2018, 6:43 PM

## 2018-02-26 NOTE — Plan of Care (Signed)
Patient denies any pain at this time. Will continue to monitor.

## 2018-02-26 NOTE — Procedures (Signed)
     HEMODIALYSIS TREATMENT NOTE:    4 hour heparin-free dialysis completed via right upper arm AVF (15g ante/retrograde). Goal met: 3 liters removed without interruption in ultrafiltration.  All blood was returned and hemostasis was achieved in 15 minutes. Report given to Harless Litten, RN.   Rockwell Alexandria, RN, CDN

## 2018-02-26 NOTE — Care Management Note (Signed)
Case Management Note  Patient Details  Name: Martha [name] MRN: [id number] Date of Birth: Feb 23, 1966  Subjective/Objective:  Pt admitted with volume overload and UTI. From home with spouse. Pt ESRD on HD. Pt missed last HD session because she was up all night having to pee frequently and overslept. They HD center would have worked her in but pt felt too bad to get up and go. Pt reports compliance with medications and diet. Pt has transportation to appointments. She has no HH or DME needs.                   Action/Plan: DC home with self care. No CM needs noted at this time.   Pt has had 3 admissions in last 6 months. Chart reviewed on admission was for the removal of her eye, one from complications during HD and this admission for UTI and missed HD session. Pt is not homebound and this CM does not feel HH would impact the frequency of admission. Pt may be appropriate for Paramedicine program if admissions continue to occur frequently.   Expected Discharge Date:  02/26/18               Expected Discharge Plan:  Home/Self Care  In-House Referral:  NA  Discharge planning Services  CM Consult  Post Acute Care Choice:  NA Choice offered to:  NA  DME Arranged:    DME Agency:     HH Arranged:    HH Agency:     Status of Service:  Completed, signed off  If discussed at [employer] of Stay Meetings, dates discussed:    Additional Comments:  Sherald Barge, RN 02/26/2018, 12:57 PM

## 2018-02-26 NOTE — Consult Note (Signed)
Reason for Consult: End-stage renal disease Referring Physician: Dr. Deborha Payment is an 52 y.o. female.  HPI: She is a patient who has history of hypertension, diabetes, bipolar disorder, end-stage renal disease on maintenance hemodialysis presently came with complaints of recent polydipsia and, high blood sugar.  Patient denies any fever or sweating but she has some chills.  When she was evaluated emergency room she was found to have urinary tract infection and is admitted to the hospital.  Presently she states that she is feeling better.  She complains of also some nausea but no vomiting.  She feels tired.  Denies any difficulty breathing.  Past Medical History:  Diagnosis Date  . Acid reflux    takes Tums  . Anemia   . Arthritis   . Bipolar 1 disorder ([place])   . Blindness of left eye   . Cervical radiculopathy   . CHF (congestive heart failure) ([place])   . Chronic kidney disease    Stage 5- 01/25/17  . COPD (chronic obstructive pulmonary disease) ([place]) 2014   bronchitis  . Degenerative disc disease, thoracic   . Depression   . Diabetes mellitus    Type II  . Diabetic retinopathy ([place])   . Hypertension   . Noncompliance with medication regimen   . Noncompliance with medication regimen   . Obesity (BMI 30-39.9)   . OSA (obstructive sleep apnea)    cpap  . Panic attack   . RLS (restless legs syndrome)     Past Surgical History:  Procedure Laterality Date  . AV FISTULA PLACEMENT Right 01/27/2017   Procedure: ARTERIOVENOUS (AV) FISTULA CREATION-RIGHT ARM;  Surgeon: Elam Dutch, MD;  Location: [facility];  Service: Vascular;  Laterality: Right;  . BIOPSY N/A 05/17/2013   Procedure: BIOPSY;  Surgeon: [name], MD;  Location: [facility];  Service: Endoscopy;  Laterality: N/A;  . CHOLECYSTECTOMY N/A 04/27/2014   Procedure: LAPAROSCOPIC CHOLECYSTECTOMY;  Surgeon: [name], MD;  Location: [facility];  Service: General;  Laterality: N/A;  . COLONOSCOPY  06/06/2010    WHQ:PRFFMB bleeding secondary to internal hemorrhoids but incomplete evaluation secondary to poor right colon prep/small rectal and sigmoid colon polyps (hyperplastic). PROPOFOL  . COLONOSCOPY  May 2013   Dr. Gilliam/[facility]: 5 mm a descending colon polyp, hyperplastic. Adequate bowel prep.  . ENDOMETRIAL ABLATION    . 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 ESOPHAGOGASTRODUODENOSCOPY  06/06/2010   WGY:KZLDJTT erythema and edema of body of stomach, with sessile polypoid lesions. bx benign. no h.pylori  . ESOPHAGOGASTRODUODENOSCOPY (EGD) WITH PROPOFOL N/A 05/17/2013   Dr. Oneida Alar: normal esophagus, moderate nodular gastritis, negative path, empiric Savary dilation  . EYE SURGERY  07/2017   sx for glaucoma  . FLEXIBLE SIGMOIDOSCOPY N/A 05/17/2013   Dr. Oneida Alar: moderate sized internal hemorrhoids  . HEMORRHOID BANDING N/A 05/17/2013   Procedure: HEMORRHOID BANDING;  Surgeon: [name], MD;  Location: [facility];  Service: Endoscopy;  Laterality: N/A;  2 bands placed  . INCISION AND DRAINAGE ABSCESS Left 10/11/2013   Procedure: INCISION AND DRAINAGE AND DEBRIDEMENT LEFT BREAST  ABSCESS;  Surgeon: [name], MD;  Location: [facility];  Service: General;  Laterality: Left;  . IRRIGATION AND DEBRIDEMENT ABSCESS Right 06/01/2013   Procedure: INCISION AND  DRAINAGE AND DEBRIDEMENT ABSCESS RIGHT BREAST;  Surgeon: [name], MD;  Location: [facility];  Service: General;  Laterality: Right;  . SAVORY DILATION N/A 05/17/2013   Procedure: SAVORY DILATION;  Surgeon: [name], MD;  Location: [facility];  Service: Endoscopy;  Laterality: N/A;  14/15/16  . TUBAL LIGATION      Family History  Adopted: Yes  Family history unknown: Yes    Social History:  reports that she quit  smoking about 10 years ago. Her smoking use included cigarettes. She has a 37.50 pack-year smoking history. She has never used smokeless tobacco. She reports that she does not drink alcohol or use drugs.  Allergies:  Allergies  Allergen Reactions  . Codeine Nausea And Vomiting  . Tape Other (See Comments)    Plastic Tape    Medications: I have reviewed the patient's current medications.  Results for orders placed or performed during the hospital encounter of 02/25/18 (from the past 48 hour(s))  Urinalysis, Routine w reflex microscopic     Status: Abnormal   Collection Time: 02/25/18  5:13 PM  Result Value Ref Range   Color, Urine YELLOW YELLOW   APPearance TURBID (A) CLEAR   Specific Gravity, Urine 1.015 1.005 - 1.030   pH 6.0 5.0 - 8.0   Glucose, UA >=500 (A) NEGATIVE mg/dL   Hgb urine dipstick SMALL (A) NEGATIVE   Bilirubin Urine NEGATIVE NEGATIVE   Ketones, ur NEGATIVE NEGATIVE mg/dL   Protein, ur 100 (A) NEGATIVE mg/dL   Nitrite NEGATIVE NEGATIVE   Leukocytes, UA SMALL (A) NEGATIVE   RBC / HPF 6-10 0 - 5 RBC/hpf   WBC, UA 11-20 0 - 5 WBC/hpf   Bacteria, UA FEW (A) NONE SEEN   Squamous Epithelial / LPF >50 (H) 0 - 5   Mucus PRESENT     Comment: Performed at [facility], [address], [place], [place] [address]  CBG monitoring, ED     Status: Abnormal   Collection Time: 02/25/18  5:15 PM  Result Value Ref Range   Glucose-Capillary 294 (H) 70 - 99 mg/dL  Basic metabolic panel     Status: Abnormal   Collection Time: 02/25/18  6:24 PM  Result Value Ref Range   Sodium 140 135 - 145 mmol/L   Potassium 4.6 3.5 - 5.1 mmol/L   Chloride 109 98 - 111 mmol/L   CO2 20 (L) 22 - 32 mmol/L   Glucose, Bld 277 (H) 70 - 99 mg/dL   BUN 61 (H) 6 - 20 mg/dL   Creatinine, Ser 5.89 (H) 0.44 - 1.00 mg/dL   Calcium 9.8 8.9 - 10.3 mg/dL   GFR calc non Af Amer 8 (L) >60 mL/min   GFR calc Af Amer 9 (L) >60 mL/min    Comment: (NOTE) The eGFR has been calculated using the CKD EPI  equation. This calculation has not been validated in all clinical situations. eGFR's persistently <60 mL/min signify possible Chronic Kidney Disease.    Anion gap 11 5 - 15    Comment: Performed at [facility], [address] [place], [place] [address]  CBC     Status: Abnormal   Collection Time: 02/25/18  6:24 PM  Result Value Ref Range   WBC 6.2 4.0 - 10.5 K/uL   RBC 3.67 (L) 3.87 - 5.11 MIL/uL   Hemoglobin 11.0 (L) 12.0 - 15.0 g/dL   HCT 34.5 (L) 36.0 - 46.0 %   MCV 94.0 78.0 - 100.0 fL   MCH  30.0 26.0 - 34.0 pg   MCHC 31.9 30.0 - 36.0 g/dL   RDW 15.6 (H) 11.5 - 15.5 %   Platelets 128 (L) 150 - 400 K/uL    Comment: Performed at [facility], [address], [place], [place] [address]  CBG monitoring, ED     Status: Abnormal   Collection Time: 02/25/18  8:19 PM  Result Value Ref Range   Glucose-Capillary 187 (H) 70 - 99 mg/dL  Brain natriuretic peptide     Status: Abnormal   Collection Time: 02/25/18  8:19 PM  Result Value Ref Range   B Natriuretic Peptide 556.0 (H) 0.0 - 100.0 pg/mL    Comment: Performed at [facility], [address], [place], [place] [address]  Troponin I     Status: None   Collection Time: 02/25/18  8:19 PM  Result Value Ref Range   Troponin I <0.03 <0.03 ng/mL    Comment: Performed at [facility], [address] [place], [place] [address]  CBC     Status: Abnormal   Collection Time: 02/26/18  4:30 AM  Result Value Ref Range   WBC 6.1 4.0 - 10.5 K/uL   RBC 3.25 (L) 3.87 - 5.11 MIL/uL   Hemoglobin 9.6 (L) 12.0 - 15.0 g/dL   HCT 30.3 (L) 36.0 - 46.0 %   MCV 93.2 78.0 - 100.0 fL   MCH 29.5 26.0 - 34.0 pg   MCHC 31.7 30.0 - 36.0 g/dL   RDW 15.6 (H) 11.5 - 15.5 %   Platelets 115 (L) 150 - 400 K/uL    Comment: SPECIMEN CHECKED FOR CLOTS CONSISTENT WITH PREVIOUS RESULT Performed at [facility], [address] [place], [place] [address]   Comprehensive metabolic panel     Status: Abnormal   Collection Time: 02/26/18  4:30 AM  Result Value Ref Range    Sodium 140 135 - 145 mmol/L   Potassium 4.7 3.5 - 5.1 mmol/L   Chloride 108 98 - 111 mmol/L   CO2 22 22 - 32 mmol/L   Glucose, Bld 245 (H) 70 - 99 mg/dL   BUN 63 (H) 6 - 20 mg/dL   Creatinine, Ser 6.24 (H) 0.44 - 1.00 mg/dL   Calcium 9.0 8.9 - 10.3 mg/dL   Total Protein 6.1 (L) 6.5 - 8.1 g/dL   Albumin 3.1 (L) 3.5 - 5.0 g/dL   AST 10 (L) 15 - 41 U/L   ALT 13 0 - 44 U/L   Alkaline Phosphatase 65 38 - 126 U/L   Total Bilirubin 0.7 0.3 - 1.2 mg/dL   GFR calc non Af Amer 7 (L) >60 mL/min   GFR calc Af Amer 8 (L) >60 mL/min    Comment: (NOTE) The eGFR has been calculated using the CKD EPI equation. This calculation has not been validated in all clinical situations. eGFR's persistently <60 mL/min signify possible Chronic Kidney Disease.    Anion gap 10 5 - 15    Comment: Performed at [facility], [address] [place], [place] [address]  Glucose, capillary     Status: Abnormal   Collection Time: 02/26/18  6:22 AM  Result Value Ref Range   Glucose-Capillary 210 (H) 70 - 99 mg/dL    Dg Chest 2 View  Result Date: 02/25/2018 CLINICAL DATA:  Short of breath with nausea EXAM: CHEST - 2 VIEW COMPARISON:  12/13/2017 FINDINGS: Mild cardiomegaly. No acute consolidation or effusion. No pneumothorax. IMPRESSION: No active cardiopulmonary disease.  Cardiomegaly Electronically Signed   By: Maudie Mercury  Francoise Ceo M.D.   On: 02/25/2018 21:50    Review of Systems  Constitutional: Positive for chills and malaise/fatigue. Negative for fever.  Eyes:       Blind left eye (S/P enucleation}  Respiratory: Negative for cough, sputum production, shortness of breath and wheezing.   Cardiovascular: Negative for chest pain, orthopnea and claudication.  Gastrointestinal: Negative for nausea and vomiting.       Patient with polydipsia  Genitourinary: Negative for dysuria, frequency and urgency.  Neurological: Positive for weakness.   Blood pressure (!) 186/89, pulse 91, temperature 99.6 F (37.6 C), temperature  source Oral, resp. rate 20, height '5\' 11"'$  (1.803 m), weight (!) 150.8 kg (332 lb 8 oz), SpO2 100 %. Physical Exam  Constitutional: She is oriented to person, place, and time. No distress.  Eyes: No scleral icterus.  Neck: No JVD present. No thyromegaly present.  Cardiovascular: Normal rate and regular rhythm.  No murmur heard. Respiratory: No respiratory distress. She has no wheezes. She has no rales.  GI: She exhibits no distension. There is no tenderness. There is no rebound.  Musculoskeletal: She exhibits no edema.  Neurological: She is oriented to person, place, and time.    Assessment/Plan: 1] urinary tract infection: Patient is on antibiotics.  Culture is pending.  She is afebrile and her white blood cell count is normal. 2] end-stage renal disease: She is status post hemodialysis on Wednesday.  Potassium is normal.  Patient is due for dialysis today. 3] history of COPD 4] hypertension: Her blood pressure is reasonably controlled 5] diabetes: Poorly controlled.  Patient was saying her blood sugar occasionally runs as high as 400.  No change in her medication. 6] anemia: Her hemoglobin is below target goal.  Patient is on Epogen as outpatient. 7] history of diabetic retinopathy.  She is status post left eye enucleation about 3 weeks ago. Plan: 1] we will make arrangements for patient to get dialysis today for 4 hours 2] we will remove about 3 L if systolic blood pressure remains above 90 3] we will check renal panel and CBC in the morning.Marland Kitchen  Eshaan Titzer S 02/26/2018, 9:31 AM

## 2018-02-27 LAB — RENAL FUNCTION PANEL
ALBUMIN: 3.1 g/dL — AB (ref 3.5–5.0)
Anion gap: 13 (ref 5–15)
BUN: 35 mg/dL — AB (ref 6–20)
CALCIUM: 8.6 mg/dL — AB (ref 8.9–10.3)
CO2: 27 mmol/L (ref 22–32)
Chloride: 99 mmol/L (ref 98–111)
Creatinine, Ser: 4.47 mg/dL — ABNORMAL HIGH (ref 0.44–1.00)
GFR calc Af Amer: 12 mL/min — ABNORMAL LOW (ref >60)
GFR calc non Af Amer: 11 mL/min — ABNORMAL LOW (ref >60)
GLUCOSE: 191 mg/dL — AB (ref 70–99)
PHOSPHORUS: 4.4 mg/dL (ref 2.5–4.6)
Potassium: 3.7 mmol/L (ref 3.5–5.1)
SODIUM: 139 mmol/L (ref 135–145)

## 2018-02-27 LAB — BASIC METABOLIC PANEL
ANION GAP: 13 (ref 5–15)
BUN: 35 mg/dL — ABNORMAL HIGH (ref 6–20)
CO2: 27 mmol/L (ref 22–32)
Calcium: 8.6 mg/dL — ABNORMAL LOW (ref 8.9–10.3)
Chloride: 99 mmol/L (ref 98–111)
Creatinine, Ser: 4.47 mg/dL — ABNORMAL HIGH (ref 0.44–1.00)
GFR calc Af Amer: 12 mL/min — ABNORMAL LOW (ref >60)
GFR calc non Af Amer: 11 mL/min — ABNORMAL LOW (ref >60)
GLUCOSE: 191 mg/dL — AB (ref 70–99)
Potassium: 3.7 mmol/L (ref 3.5–5.1)
Sodium: 139 mmol/L (ref 135–145)

## 2018-02-27 LAB — URINE CULTURE: Culture: NO GROWTH

## 2018-02-27 LAB — CBC
HCT: 31.4 % — ABNORMAL LOW (ref 36.0–46.0)
Hemoglobin: 9.8 g/dL — ABNORMAL LOW (ref 12.0–15.0)
MCH: 29.3 pg (ref 26.0–34.0)
MCHC: 31.2 g/dL (ref 30.0–36.0)
MCV: 94 fL (ref 78.0–100.0)
PLATELETS: 114 10*3/uL — AB (ref 150–400)
RBC: 3.34 MIL/uL — ABNORMAL LOW (ref 3.87–5.11)
RDW: 15.6 % — ABNORMAL HIGH (ref 11.5–15.5)
WBC: 5.8 10*3/uL (ref 4.0–10.5)

## 2018-02-27 LAB — GLUCOSE, CAPILLARY
Glucose-Capillary: 183 mg/dL — ABNORMAL HIGH (ref 70–99)
Glucose-Capillary: 250 mg/dL — ABNORMAL HIGH (ref 70–99)

## 2018-02-27 MED ORDER — INSULIN GLARGINE 100 UNIT/ML SOLOSTAR PEN: 15.0000 [IU] | PEN_INJECTOR | Freq: Every day | SUBCUTANEOUS | 11 refills | Status: DC

## 2018-02-27 MED ORDER — CIPROFLOXACIN HCL 500 MG PO TABS: 500.0000 mg | ORAL_TABLET | Freq: Every day | ORAL | 0 refills | Status: AC

## 2018-02-27 NOTE — Discharge Summary (Signed)
Physician Discharge Summary  Martha [name] CNO:[id number] DOB: [birth year] DOA: 02/25/2018  PCP: Sinda Du, MD  Admit date: 02/25/2018 Discharge date: 02/27/2018  Admitted From: home Disposition:  home  Recommendations for Outpatient Follow-up:  1. Follow up with PCP in 1-2 weeks 2. Please obtain BMP/CBC in one week 3. Follow up at dialysis center for regular dialysis on 8/5 4. Follow up final urine culture results   Discharge Condition: stable CODE STATUS:full code Diet recommendation: heart healthy, carb mod  Brief/Interim Summary: 52 y/o female with history of ESRD on HD, DM, HTN, presented to the hospital with elevated blood sugar and shortness of breath related to noncompliance with dialysis. She was also found to have a urinary tract infection and was treated with IV antibiotics. She was seen by nephrology and underwent dialysis  Discharge Diagnoses:  Principal Problem:   Volume overload Active Problems:   Hyperlipidemia   Depression   Essential hypertension   GERD   Sleep apnea   Anemia   Diabetes mellitus, type 2 (HCC)   ESRD (end stage renal disease) on dialysis ([facility])   UTI (urinary tract infection)  1. ESRD on HD. Patient had been noncompliant with dialysis. She felt volume overloaded on admission. She did not have any edema on chest xray and no peripheral edema. Nephrology followed patient and patient underwent dialysis. She is feeling better at this point and does not have any shortness of breath 2. Uncontrolled diabetes. Patient had reported that her blood sugars had been running in the 400 range prior to admission. She was monitored in the hospital and continued on her home dose of lantus. Blood sugars remained fairly controlled in the hospital. I suspect that her hyperglycemia at home may be related to her food choices. 3. UTI. Started on ceftriaxone. Urine culture in process. She has been transitioned to cipro 4. HLD. Continue statin 5. HTN. She is continued on  coreg, hydralazine and torsemide 6. OSA. Continue on CPAP  Discharge Instructions  Discharge Instructions    Diet - low sodium heart healthy   Complete by:  As directed    Increase activity slowly   Complete by:  As directed      Allergies as of 02/27/2018      Reactions   Codeine Nausea And Vomiting   Tape Other (See Comments)   Plastic Tape      Medication List    TAKE these medications   ALPRAZolam 1 MG tablet Commonly known as:  XANAX Take 1 mg by mouth 4 (four) times daily as needed for anxiety (nerves).   atorvastatin 20 MG tablet Commonly known as:  LIPITOR Take 20 mg by mouth daily.   busPIRone 7.5 MG tablet Commonly known as:  BUSPAR Take 7.5 mg by mouth 2 (two) times daily.   calcium carbonate 750 MG chewable tablet Commonly known as:  TUMS EX Chew 1-3 tablets by mouth 6 (six) times daily. Patient takes 3 tablets with meals and 1 tablet with snacks three times a day   carvedilol 12.5 MG tablet Commonly known as:  COREG Take 1 tablet (12.5 mg total) by mouth 2 (two) times daily with a meal.   ciprofloxacin 500 MG tablet Commonly known as:  CIPRO Take 1 tablet (500 mg total) by mouth daily with breakfast for 10 days.   colchicine 0.6 MG tablet Take 1 tablet (0.6 mg total) by mouth every other day.   dicyclomine 10 MG capsule Commonly known as:  BENTYL Take 1 capsule (10 mg  total) by mouth 4 (four) times daily -  before meals and at bedtime.   gabapentin 300 MG capsule Commonly known as:  NEURONTIN Take 300 mg by mouth 2 (two) times daily.   hydrALAZINE 25 MG tablet Commonly known as:  APRESOLINE Take 1 tablet (25 mg total) by mouth every 8 (eight) hours. What changed:  when to take this   HYDROcodone-acetaminophen 10-325 MG tablet Commonly known as:  NORCO Take 1 tablet by mouth every 6 (six) hours as needed for moderate pain. What changed:  when to take this   Insulin Glargine 100 UNIT/ML Solostar Pen Commonly known as:  LANTUS  SOLOSTAR Inject 15 Units into the skin at bedtime. What changed:  how much to take   lidocaine-prilocaine cream Commonly known as:  EMLA Apply 1 application topically as needed (topical anesthesia for hemodialysis if Gebauers and Lidocaine injection are ineffective.).   PARoxetine 20 MG tablet Commonly known as:  PAXIL Take 1 daily. This is to prevent panic attacks What changed:    how much to take  how to take this  when to take this  additional instructions   torsemide 20 MG tablet Commonly known as:  DEMADEX Take 20 mg by mouth daily.       Allergies  Allergen Reactions  . Codeine Nausea And Vomiting  . Tape Other (See Comments)    Plastic Tape    Consultations:  Nephrology   Procedures/Studies: Dg Chest 2 View  Result Date: 02/25/2018 CLINICAL DATA:  Short of breath with nausea EXAM: CHEST - 2 VIEW COMPARISON:  12/13/2017 FINDINGS: Mild cardiomegaly. No acute consolidation or effusion. No pneumothorax. IMPRESSION: No active cardiopulmonary disease.  Cardiomegaly Electronically Signed   By: Donavan Foil M.D.   On: 02/25/2018 21:50       Subjective: Feeling better, denies any shortness of breath. No nausea or vomiting  Discharge Exam: Vitals:   02/26/18 2130 02/27/18 0730  BP: 134/70 126/62  Pulse: 76 85  Resp: 16 12  Temp: 99.2 F (37.3 C) 99.2 F (37.3 C)  SpO2: 98% 91%   Vitals:   02/26/18 2100 02/26/18 2110 02/26/18 2130 02/27/18 0730  BP: 122/60 (!) 117/59 134/70 126/62  Pulse: 78 77 76 85  Resp:   16 12  Temp:   99.2 F (37.3 C) 99.2 F (37.3 C)  TempSrc:   Oral Oral  SpO2:   98% 91%  Weight:   (!) 147.7 kg (325 lb 9.9 oz)   Height:        General: Pt is alert, awake, not in acute distress Cardiovascular: RRR, S1/S2 +, no rubs, no gallops Respiratory: CTA bilaterally, no wheezing, no rhonchi Abdominal: Soft, NT, ND, bowel sounds + Extremities: no edema, no cyanosis    The results of significant diagnostics from this  hospitalization (including imaging, microbiology, ancillary and laboratory) are listed below for reference.     Microbiology: No results found for this or any previous visit (from the past 240 hour(s)).   Labs: BNP (last 3 results) Recent Labs    04/05/17 1738 04/20/17 1606 02/25/18 2019  BNP 820.0* 836.0* 585.2*   Basic Metabolic Panel: Recent Labs  Lab 02/25/18 1824 02/26/18 0430 02/27/18 0649  NA 140 140 139  139  K 4.6 4.7 3.7  3.7  CL 109 108 99  99  CO2 20* 22 27  27   GLUCOSE 277* 245* 191*  191*  BUN 61* 63* 35*  35*  CREATININE 5.89* 6.24* 4.47*  4.47*  CALCIUM  9.8 9.0 8.6*  8.6*  PHOS  --   --  4.4   Liver Function Tests: Recent Labs  Lab 02/26/18 0430 02/27/18 0649  AST 10*  --   ALT 13  --   ALKPHOS 65  --   BILITOT 0.7  --   PROT 6.1*  --   ALBUMIN 3.1* 3.1*   No results for input(s): LIPASE, AMYLASE in the last 168 hours. No results for input(s): AMMONIA in the last 168 hours. CBC: Recent Labs  Lab 02/25/18 1824 02/26/18 0430 02/27/18 0649  WBC 6.2 6.1 5.8  HGB 11.0* 9.6* 9.8*  HCT 34.5* 30.3* 31.4*  MCV 94.0 93.2 94.0  PLT 128* 115* 114*   Cardiac Enzymes: Recent Labs  Lab 02/25/18 2019  TROPONINI <0.03   BNP: Invalid input(s): POCBNP CBG: Recent Labs  Lab 02/26/18 0622 02/26/18 1120 02/26/18 1617 02/26/18 2248 02/27/18 0744  GLUCAP 210* 263* 185* 146* 183*   D-Dimer No results for input(s): DDIMER in the last 72 hours. Hgb A1c No results for input(s): HGBA1C in the last 72 hours. Lipid Profile No results for input(s): CHOL, HDL, LDLCALC, TRIG, CHOLHDL, LDLDIRECT in the last 72 hours. Thyroid function studies No results for input(s): TSH, T4TOTAL, T3FREE, THYROIDAB in the last 72 hours.  Invalid input(s): FREET3 Anemia work up No results for input(s): VITAMINB12, FOLATE, FERRITIN, TIBC, IRON, RETICCTPCT in the last 72 hours. Urinalysis    Component Value Date/Time   COLORURINE YELLOW 02/25/2018 1713    APPEARANCEUR TURBID (A) 02/25/2018 1713   LABSPEC 1.015 02/25/2018 1713   PHURINE 6.0 02/25/2018 1713   GLUCOSEU >=500 (A) 02/25/2018 1713   HGBUR SMALL (A) 02/25/2018 1713   BILIRUBINUR NEGATIVE 02/25/2018 1713   KETONESUR NEGATIVE 02/25/2018 1713   PROTEINUR 100 (A) 02/25/2018 1713   UROBILINOGEN 0.2 03/17/2015 2236   NITRITE NEGATIVE 02/25/2018 1713   LEUKOCYTESUR SMALL (A) 02/25/2018 1713   Sepsis Labs Invalid input(s): PROCALCITONIN,  WBC,  LACTICIDVEN Microbiology No results found for this or any previous visit (from the past 240 hour(s)).   Time coordinating discharge: 63mins  SIGNED:   Kathie Dike, MD  [facility] 02/27/2018, 11:46 AM Pager   If 7PM-7AM, please contact night-coverage [email or web address] Password [email or web address]

## 2018-02-27 NOTE — Progress Notes (Addendum)
Rounding completed. Patient sleeping on left side. Family in room. No distress noted. Call bell within reach, bed in low position. Will continue to monitor.   @ 0409 Rounding completed. Patient sleeping on left side. Family in room. No distress noted. Call bell within reach, bed in low position. Will continue to monitor.

## 2018-02-27 NOTE — Progress Notes (Signed)
Subjective: Interval History: has no complaint of fever chills or sweating.  Patient still she is feeling much better.  She is asking to go home..  Objective: Vital signs in last 24 hours: Temp:  [99.2 F (37.3 C)-99.4 F (37.4 C)] 99.2 F (37.3 C) (08/03 0730) Pulse Rate:  [76-92] 85 (08/03 0730) Resp:  [12-18] 12 (08/03 0730) BP: (117-170)/(59-90) 126/62 (08/03 0730) SpO2:  [91 %-98 %] 91 % (08/03 0730) Weight:  [147.7 kg (325 lb 9.9 oz)-150.9 kg (332 lb 10.8 oz)] 147.7 kg (325 lb 9.9 oz) (08/02 2130) Weight change: 5.295 kg (11 lb 10.8 oz)  Intake/Output from previous day: 08/02 0701 - 08/03 0700 In: 100 [IV Piggyback:100] Out: 3500 [Urine:500] Intake/Output this shift: No intake/output data recorded.  General appearance: alert, cooperative and no distress Resp: clear to auscultation bilaterally Cardio: regular rate and rhythm No edema  Lab Results: Recent Labs    02/26/18 0430 02/27/18 0649  WBC 6.1 5.8  HGB 9.6* 9.8*  HCT 30.3* 31.4*  PLT 115* 114*   BMET:  Recent Labs    02/26/18 0430 02/27/18 0649  NA 140 139  139  K 4.7 3.7  3.7  CL 108 99  99  CO2 22 27  27   GLUCOSE 245* 191*  191*  BUN 63* 35*  35*  CREATININE 6.24* 4.47*  4.47*  CALCIUM 9.0 8.6*  8.6*   No results for input(s): PTH in the last 72 hours. Iron Studies: No results for input(s): IRON, TIBC, TRANSFERRIN, FERRITIN in the last 72 hours.  Studies/Results: Dg Chest 2 View  Result Date: 02/25/2018 CLINICAL DATA:  Short of breath with nausea EXAM: CHEST - 2 VIEW COMPARISON:  12/13/2017 FINDINGS: Mild cardiomegaly. No acute consolidation or effusion. No pneumothorax. IMPRESSION: No active cardiopulmonary disease.  Cardiomegaly Electronically Signed   By: Donavan Foil M.D.   On: 02/25/2018 21:50    I have reviewed the patient's current medications.  Assessment/Plan: 1] urinary tract infection: Presently culture is pending.  Patient is afebrile and her white blood cell count is  normal.  2] end-stage renal disease: She is status post hemodialysis yesterday.  Her potassium is normal and her appetite is good 2] hypertension: Her blood pressure is reasonably controlled 4] anemia: Her hemoglobin is within our target goal.  Patient is on Epogen as an outpatient. 5] bone and mineral disorder: Her calcium and phosphorus is range. 6] fluid management: Presently patient denies any difficulty breathing.  We are able to remove about 3 L yesterday. 7] diabetes: Her blood sugar seems to be somewhat better.  She claims her blood sugar at home is high and she is going to see Dr. Luan Pulling her primary care physician. Plan: 1] continue his present management 2] her next dialysis would be Monday which is her regular schedule.  Could be done as an outpatient if she is going to be discharged.     LOS: 1 day   Rainn Bullinger S 02/27/2018,8:36 AM

## 2018-02-27 NOTE — Progress Notes (Signed)
Discharge instructions read to patient and her fiance.  Both verbalized understanding of all instructions.  Discharged to home with fiance.

## 2018-02-28 LAB — HEPATITIS B SURFACE ANTIGEN: Hepatitis B Surface Ag: NEGATIVE

## 2018-04-22 ENCOUNTER — Emergency Department (HOSPITAL_COMMUNITY)
Admission: EM | Admit: 2018-04-22 | Discharge: 2018-04-22 | Disposition: A | Discharge status: Home / Self Care | Payer: Medicaid Other | Attending: Emergency Medicine | Admitting: Emergency Medicine

## 2018-04-22 ENCOUNTER — Encounter (HOSPITAL_COMMUNITY): Payer: Self-pay | Admitting: Emergency Medicine

## 2018-04-22 ENCOUNTER — Other Ambulatory Visit: Payer: Self-pay

## 2018-04-22 DIAGNOSIS — E119 Type 2 diabetes mellitus without complications: Secondary | ICD-10-CM | POA: Diagnosis not present

## 2018-04-22 DIAGNOSIS — Z87891 Personal history of nicotine dependence: Secondary | ICD-10-CM | POA: Insufficient documentation

## 2018-04-22 DIAGNOSIS — Z794 Long term (current) use of insulin: Secondary | ICD-10-CM | POA: Insufficient documentation

## 2018-04-22 DIAGNOSIS — N186 End stage renal disease: Secondary | ICD-10-CM | POA: Diagnosis not present

## 2018-04-22 DIAGNOSIS — Z992 Dependence on renal dialysis: Secondary | ICD-10-CM | POA: Insufficient documentation

## 2018-04-22 DIAGNOSIS — I509 Heart failure, unspecified: Secondary | ICD-10-CM | POA: Diagnosis not present

## 2018-04-22 DIAGNOSIS — L089 Local infection of the skin and subcutaneous tissue, unspecified: Secondary | ICD-10-CM | POA: Diagnosis not present

## 2018-04-22 DIAGNOSIS — I132 Hypertensive heart and chronic kidney disease with heart failure and with stage 5 chronic kidney disease, or end stage renal disease: Secondary | ICD-10-CM | POA: Diagnosis not present

## 2018-04-22 DIAGNOSIS — J449 Chronic obstructive pulmonary disease, unspecified: Secondary | ICD-10-CM | POA: Diagnosis not present

## 2018-04-22 DIAGNOSIS — I2510 Atherosclerotic heart disease of native coronary artery without angina pectoris: Secondary | ICD-10-CM | POA: Insufficient documentation

## 2018-04-22 DIAGNOSIS — R21 Rash and other nonspecific skin eruption: Secondary | ICD-10-CM | POA: Diagnosis present

## 2018-04-22 DIAGNOSIS — Z79899 Other long term (current) drug therapy: Secondary | ICD-10-CM | POA: Diagnosis not present

## 2018-04-22 LAB — BASIC METABOLIC PANEL
Anion gap: 12 (ref 5–15)
BUN: 37 mg/dL — ABNORMAL HIGH (ref 6–20)
CO2: 28 mmol/L (ref 22–32)
Calcium: 9.9 mg/dL (ref 8.9–10.3)
Chloride: 98 mmol/L (ref 98–111)
Creatinine, Ser: 5.73 mg/dL — ABNORMAL HIGH (ref 0.44–1.00)
GFR, EST AFRICAN AMERICAN: 9 mL/min — AB (ref >60)
GFR, EST NON AFRICAN AMERICAN: 8 mL/min — AB (ref >60)
Glucose, Bld: 311 mg/dL — ABNORMAL HIGH (ref 70–99)
POTASSIUM: 3.7 mmol/L (ref 3.5–5.1)
SODIUM: 138 mmol/L (ref 135–145)

## 2018-04-22 LAB — URINALYSIS, ROUTINE W REFLEX MICROSCOPIC
BILIRUBIN URINE: NEGATIVE
HGB URINE DIPSTICK: NEGATIVE
Ketones, ur: NEGATIVE mg/dL
NITRITE: NEGATIVE
Protein, ur: >=300 mg/dL — AB
SPECIFIC GRAVITY, URINE: 1.017 (ref 1.005–1.030)
Squamous Epithelial / LPF: >50 — ABNORMAL HIGH (ref 0–5)
pH: 7 (ref 5.0–8.0)

## 2018-04-22 LAB — CBC WITH DIFFERENTIAL/PLATELET
BASOS ABS: 0 10*3/uL (ref 0.0–0.1)
BASOS PCT: 0 %
EOS ABS: 0.2 10*3/uL (ref 0.0–0.7)
Eosinophils Relative: 2 %
HCT: 36.7 % (ref 36.0–46.0)
HEMOGLOBIN: 12 g/dL (ref 12.0–15.0)
LYMPHS ABS: 2.6 10*3/uL (ref 0.7–4.0)
Lymphocytes Relative: 33 %
MCH: 29.7 pg (ref 26.0–34.0)
MCHC: 32.7 g/dL (ref 30.0–36.0)
MCV: 90.8 fL (ref 78.0–100.0)
Monocytes Absolute: 0.6 10*3/uL (ref 0.1–1.0)
Monocytes Relative: 8 %
NEUTROS PCT: 57 %
Neutro Abs: 4.5 10*3/uL (ref 1.7–7.7)
PLATELETS: 144 10*3/uL — AB (ref 150–400)
RBC: 4.04 MIL/uL (ref 3.87–5.11)
RDW: 15.2 % (ref 11.5–15.5)
WBC: 7.9 10*3/uL (ref 4.0–10.5)

## 2018-04-22 MED ORDER — DOXYCYCLINE HYCLATE 100 MG PO TABS
100.0000 mg | ORAL_TABLET | Freq: Once | ORAL | Status: AC
  Administered 2018-04-22: 100 mg via ORAL
  Filled 2018-04-22: qty 1

## 2018-04-22 MED ORDER — ONDANSETRON HCL 4 MG PO TABS
4.0000 mg | ORAL_TABLET | Freq: Once | ORAL | Status: AC
  Administered 2018-04-22: 4 mg via ORAL
  Filled 2018-04-22: qty 1

## 2018-04-22 MED ORDER — CYCLOBENZAPRINE HCL 10 MG PO TABS
10.0000 mg | ORAL_TABLET | Freq: Once | ORAL | Status: AC
  Administered 2018-04-22: 10 mg via ORAL
  Filled 2018-04-22: qty 1

## 2018-04-22 MED ORDER — HYDROCODONE-ACETAMINOPHEN 5-325 MG PO TABS
2.0000 | ORAL_TABLET | Freq: Once | ORAL | Status: AC
  Administered 2018-04-22: 2 via ORAL
  Filled 2018-04-22: qty 2

## 2018-04-22 NOTE — ED Provider Notes (Signed)
[facility] EMERGENCY DEPARTMENT Provider Note   CSN: [id number] Arrival date & time: 04/22/18  1837     History   Chief Complaint Chief Complaint  Patient presents with  . Rash    HPI Martha [name] is a 52 y.o. female.  Patient is a 52 year old female who presents to the emergency department with a complaint of rash of the left abdomen.  The patient states that she is a dialysis patient she also has diabetes mellitus she is concerned that she has a rash on the right abdomen that extends to the pannus.  She is particularly concerned that she could have shingles.  It is of note that she has had infection involving her left foot.  She states she has had her left eye removed, and currently has a patch in the area.  She presents to the emergency department for evaluation of this rash.  She says that she cannot be seen by her primary physician for nearly 2 weeks.  The history is provided by the patient.    Past Medical History:  Diagnosis Date  . Acid reflux    takes Tums  . Anemia   . Arthritis   . Bipolar 1 disorder ([place])   . Blindness of left eye   . Cervical radiculopathy   . CHF (congestive heart failure) ([place])   . Chronic kidney disease    Stage 5- 01/25/17  . COPD (chronic obstructive pulmonary disease) ([place]) 2014   bronchitis  . Degenerative disc disease, thoracic   . Depression   . Diabetes mellitus    Type II  . Diabetic retinopathy ([place])   . Hypertension   . Noncompliance with medication regimen   . Noncompliance with medication regimen   . Obesity (BMI 30-39.9)   . OSA (obstructive sleep apnea)    cpap  . Panic attack   . RLS (restless legs syndrome)     Patient Active Problem List   Diagnosis Date Noted  . UTI (urinary tract infection) 02/26/2018  . Volume overload 02/25/2018  . 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 Dialysis patient ([place]) 12/13/2017  . ESRD (end stage renal disease) on dialysis ([place])   . Chronic diarrhea 10/08/2017    . Renal failure (ARF), acute on chronic (HCC) 04/22/2017  . Anasarca associated with disorder of kidney 04/20/2017  . Metabolic encephalopathy 18/84/1660  . Acute pulmonary edema ([place]) 04/05/2017  . Acute psychosis ([place]) 12/01/2016  . Protein-calorie malnutrition, severe 11/29/2016  . Confusion 11/22/2016  . HTN (hypertension), malignant 11/20/2016  . Diabetes mellitus 11/20/2016  . Bipolar 1 disorder ([place]) 11/20/2016  . Noncompliance with medication regimen 11/20/2016  . Panic attack 11/20/2016  . CKD stage 5 secondary to hypertension ([place]) 11/15/2016  . AKI (acute kidney injury) ([place]) 11/15/2016  . Chest pain 10/27/2016  . CKD (chronic kidney disease) stage 5, GFR less than 15 ml/min (HCC) 10/27/2016  . Uncontrolled type 2 diabetes mellitus with hyperglycemia, without long-term current use of insulin ([place]) 10/27/2016  . Sensory disturbance 10/27/2016  . Left hemiparesis ([place]) 10/27/2016  . Acute on chronic renal failure ([place]) 05/25/2016  . Abdominal pain 04/26/2014  . Cholelithiasis 04/26/2014  . ARF (acute renal failure) ([place]) 04/26/2014  . Dehydration 04/26/2014  . Hyponatremia 04/26/2014  . Gallstones 04/06/2014  . Uncontrolled diabetes mellitus ([place]) 10/09/2013  . Breast abscess 05/30/2013  . Diabetes mellitus, type 2 ([place]) 05/30/2013  . Hyperglycemia 05/30/2013  . Internal hemorrhoids with other complication 63/07/6008  . Umbilical hernia  without mention of obstruction or gangrene 04/15/2013  . Anemia 04/15/2013  . DM 05/13/2010  . Hyperlipidemia 05/13/2010  . Morbid obesity ([place]) 05/13/2010  . Depression 05/13/2010  . RESTLESS LEG SYNDROME 05/13/2010  . Essential hypertension 05/13/2010  . GERD 05/13/2010  . RECTAL BLEEDING 05/13/2010  . Sleep apnea 05/13/2010  . Dysphagia, oropharyngeal phase 05/13/2010    Past Surgical History:  Procedure Laterality Date  . AV FISTULA PLACEMENT Right 01/27/2017   Procedure: ARTERIOVENOUS (AV) FISTULA CREATION-RIGHT ARM;   Surgeon: Elam Dutch, MD;  Location: [place];  Service: Vascular;  Laterality: Right;  . BIOPSY N/A 05/17/2013   Procedure: BIOPSY;  Surgeon: [name], MD;  Location: [facility];  Service: Endoscopy;  Laterality: N/A;  . CHOLECYSTECTOMY N/A 04/27/2014   Procedure: LAPAROSCOPIC CHOLECYSTECTOMY;  Surgeon: [name], MD;  Location: [facility];  Service: General;  Laterality: N/A;  . COLONOSCOPY  06/06/2010   MVH:QIONGE bleeding secondary to internal hemorrhoids but incomplete evaluation secondary to poor right colon prep/small rectal and sigmoid colon polyps (hyperplastic). PROPOFOL  . COLONOSCOPY  May 2013   Dr. Gilliam/[facility]: 5 mm a descending colon polyp, hyperplastic. Adequate bowel prep.  . ENDOMETRIAL ABLATION    . 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 ENUCLEATION Left 02/04/2018   Procedure: ENUCLEATION WITH PLACEMENT OF IMPLANT LEFT EYE;  Surgeon: Clista Bernhardt, MD;  Location: [place];  Service: Ophthalmology;  Laterality: Left;  . ESOPHAGOGASTRODUODENOSCOPY  06/06/2010   XBM:WUXLKGM erythema and edema of body of stomach, with sessile polypoid lesions. bx benign. no h.pylori  . ESOPHAGOGASTRODUODENOSCOPY (EGD) WITH PROPOFOL N/A 05/17/2013   Dr. Oneida Alar: normal esophagus, moderate nodular gastritis, negative path, empiric Savary dilation  . EYE SURGERY  07/2017   sx for glaucoma  . FLEXIBLE SIGMOIDOSCOPY N/A 05/17/2013   Dr. Oneida Alar: moderate sized internal hemorrhoids  . HEMORRHOID BANDING N/A 05/17/2013   Procedure: HEMORRHOID BANDING;  Surgeon: [name], MD;  Location: [facility];  Service: Endoscopy;  Laterality: N/A;  2 bands placed  . INCISION AND DRAINAGE ABSCESS Left 10/11/2013   Procedure: INCISION AND DRAINAGE AND DEBRIDEMENT LEFT BREAST  ABSCESS;  Surgeon: [name], MD;  Location: [facility];  Service: General;  Laterality: Left;  . IRRIGATION AND DEBRIDEMENT ABSCESS Right 06/01/2013   Procedure: INCISION AND DRAINAGE  AND DEBRIDEMENT ABSCESS RIGHT BREAST;  Surgeon: [name], MD;  Location: [facility];  Service: General;  Laterality: Right;  . SAVORY DILATION N/A 05/17/2013   Procedure: SAVORY DILATION;  Surgeon: [name], MD;  Location: [facility];  Service: Endoscopy;  Laterality: N/A;  14/15/16  . TUBAL LIGATION       OB History    Gravida  2   Para  2   Term  1   Preterm  1   AB      Living  2     SAB      TAB      Ectopic      Multiple      Live Births               Home Medications    Prior to Admission medications   Medication Sig Start Date End Date Taking? Authorizing Provider  ALPRAZolam Duanne Moron) 1 MG tablet Take 1 mg by mouth 4 (four) times daily as needed for anxiety (nerves).    [provider]  atorvastatin (LIPITOR) 20 MG tablet Take 20 mg by mouth  daily.     [provider]  busPIRone (BUSPAR) 7.5 MG tablet Take 7.5 mg by mouth 2 (two) times daily.    [provider]  calcium carbonate (TUMS EX) 750 MG chewable tablet Chew 1-3 tablets by mouth 6 (six) times daily. Patient takes 3 tablets with meals and 1 tablet with snacks three times a day    [provider]  carvedilol (COREG) 12.5 MG tablet Take 1 tablet (12.5 mg total) by mouth 2 (two) times daily with a meal. 05/26/16   Cherene Altes, MD  colchicine 0.6 MG tablet Take 1 tablet (0.6 mg total) by mouth every other day. 11/19/16   Sinda Du, MD  dicyclomine (BENTYL) 10 MG capsule Take 1 capsule (10 mg total) by mouth 4 (four) times daily -  before meals and at bedtime. 10/08/17   Annitta Needs, NP  gabapentin (NEURONTIN) 300 MG capsule Take 300 mg by mouth 2 (two) times daily.    [provider]  hydrALAZINE (APRESOLINE) 25 MG tablet Take 1 tablet (25 mg total) by mouth every 8 (eight) hours. Patient taking differently: Take 25 mg by mouth 4 (four) times daily.  10/29/16   Sinda Du, MD  HYDROcodone-acetaminophen (NORCO) 10-325 MG tablet Take 1  tablet by mouth every 6 (six) hours as needed for moderate pain. Patient taking differently: Take 1 tablet by mouth 4 (four) times daily.  01/27/17   Rhyne, Hulen Shouts, PA-C  Insulin Glargine (LANTUS SOLOSTAR) 100 UNIT/ML Solostar Pen Inject 15 Units into the skin at bedtime. 02/27/18   Kathie Dike, MD  lidocaine-prilocaine (EMLA) cream Apply 1 application topically as needed (topical anesthesia for hemodialysis if Gebauers and Lidocaine injection are ineffective.). 04/25/17   Johnson, Clanford L, MD  PARoxetine (PAXIL) 20 MG tablet Take 1 daily. This is to prevent panic attacks Patient taking differently: Take 20-40 mg by mouth daily. This is to prevent panic attacks 10/28/16   Sinda Du, MD  torsemide (DEMADEX) 20 MG tablet Take 20 mg by mouth daily.    [provider]    Family History Family History  Adopted: Yes  Family history unknown: Yes    Social History Social History   Tobacco Use  . Smoking status: Former Smoker    Packs/day: 1.50    Years: 25.00    Pack years: 37.50    Types: Cigarettes    Last attempt to quit: 10/23/2007    Years since quitting: 10.5  . Smokeless tobacco: Never Used  . Tobacco comment: Quit x 10 years ago  Substance Use Topics  . Alcohol use: No  . Drug use: No     Allergies   Codeine and Tape   Review of Systems Review of Systems  Constitutional: Negative for activity change.       All ROS Neg except as noted in HPI  HENT: Negative for nosebleeds.   Eyes: Negative for photophobia and discharge.  Respiratory: Negative for cough, shortness of breath and wheezing.   Cardiovascular: Negative for chest pain and palpitations.  Gastrointestinal: Negative for abdominal pain and blood in stool.  Genitourinary: Negative for dysuria, frequency and hematuria.  Musculoskeletal: Positive for arthralgias and back pain. Negative for neck pain.  Skin: Positive for rash.  Neurological: Negative for dizziness, seizures and speech difficulty.    Psychiatric/Behavioral: Negative for confusion and hallucinations.     Physical Exam Updated Vital Signs BP (!) 146/92 (BP Location: Left Arm)   Pulse 72   Temp (!) 97.5 F (  36.4 C) (Oral)   Resp 18   Ht 5\' 7"  (1.702 m)   Wt (!) 150 kg   SpO2 94%   BMI 51.79 kg/m   Physical Exam  Constitutional: She is oriented to person, place, and time. She appears well-developed and well-nourished.  Non-toxic appearance.  Morbidly obese  HENT:  Head: Normocephalic.  Right Ear: Tympanic membrane and external ear normal.  Left Ear: Tympanic membrane and external ear normal.  Eyes: Pupils are equal, round, and reactive to light. EOM and lids are normal.  Neck: Normal range of motion. Neck supple. Carotid bruit is not present.  Cardiovascular: Normal rate, regular rhythm, normal heart sounds, intact distal pulses and normal pulses.  Pulmonary/Chest: Breath sounds normal. No respiratory distress.  Abdominal: Soft. Bowel sounds are normal. There is no tenderness. There is no guarding.    Musculoskeletal: Normal range of motion.  Lymphadenopathy:       Head (right side): No submandibular adenopathy present.       Head (left side): No submandibular adenopathy present.    She has no cervical adenopathy.  Neurological: She is alert and oriented to person, place, and time. She has normal strength. No cranial nerve deficit or sensory deficit.  Skin: Skin is warm and dry.  Psychiatric: She has a normal mood and affect. Her speech is normal.  Nursing note and vitals reviewed.    ED Treatments / Results  Labs (all labs ordered are listed, but only abnormal results are displayed) Labs Reviewed - No data to display  EKG None  Radiology No results found.  Procedures Procedures (including critical care time)  Medications Ordered in ED Medications - No data to display   Initial Impression / Assessment and Plan / ED Course  I have reviewed the triage vital signs and the nursing  notes.  Pertinent labs & imaging results that were available during my care of the patient were reviewed by me and considered in my medical decision making (see chart for details).       Final Clinical Impressions(s) / ED Diagnoses MDM  Vital signs reviewed pulse oximetry is 96% on room air.  Within normal limits by my interpretation.  Patient has a rash on the right lower abdomen.  This rash is red and macular.  There are no fluid-filled areas consistent with hives or with shingles.  There are only 2 areas that are tender of this area from the flank to the pannus on the left.  No red streaks appreciated.  Given the patient's dialysis and diabetes status, will obtain the complete blood count and the basic metabolic panel.  The complete blood count shows the white blood cells to be normal at 7900.  There is no shift to the left.  The basic metabolic panel shows a cloudy yellow specimen.  There is trace leukocyte esterase, otherwise the urine analysis is within normal limits.  The patient developed some pain and spasm of her lower back while in the hospital stretcher.  This was treated with muscle relaxer and Norco.  I suspect that this rash is probably related to an infection, as the patient has an open area just below the reddened area from the periumbilical area to the flank on the left.  Patient is already been prescribed Keflex.  We will try the Keflex for now.  I have asked the patient to return to the emergency department immediately if any changes in condition, problems, or concerns.     Final diagnoses:  Skin infection  ED Discharge Orders    None       Lily Kocher, PA-C 04/23/18 0022    Francine Graven, DO 04/23/18 2153

## 2018-04-22 NOTE — Discharge Instructions (Addendum)
Please cleanse the abdomen daily with the alcohol.  Please use the Keflex that is already been ordered with breakfast, lunch, dinner, and at bedtime.  Please discuss your blood sugar with your dialysis doctor.  Your blood sugar was elevated tonight at 311.  You also have some abnormalities related to your blood sugar in your urine analysis.  Please discuss these with your dialysis doctor and your primary physician.  Please return to the emergency department if any changes in your condition, problems, or concerns.

## 2018-04-22 NOTE — ED Triage Notes (Signed)
Pt reports L sided abd rash that extends to pannus. States it began yesterday and has been a cream on it without improvement. Denies new soaps, detergents, or personal care items. Last dialysis was yesterday for full time.

## 2018-04-29 ENCOUNTER — Other Ambulatory Visit: Payer: Self-pay

## 2018-04-29 DIAGNOSIS — G458 Other transient cerebral ischemic attacks and related syndromes: Secondary | ICD-10-CM

## 2018-04-29 DIAGNOSIS — Z992 Dependence on renal dialysis: Principal | ICD-10-CM

## 2018-04-29 DIAGNOSIS — N186 End stage renal disease: Secondary | ICD-10-CM

## 2018-05-04 ENCOUNTER — Encounter (HOSPITAL_COMMUNITY): Payer: Self-pay | Admitting: *Deleted

## 2018-05-04 NOTE — H&P (Signed)
Subjective:    Martha [name] is a 52 y.o. female who presents for evaluation of extruding orbital implant. The pain is described as minimal. Onset was a few weeks ago. Symptoms have been unchanged since.   Review of Systems Pertinent items noted in HPI and remainder of comprehensive ROS otherwise negative.    Objective:   There were no vitals taken for this visit.  General:  alert, cooperative and appears stated age Skin:  normal Eyes: positive findings: extruding orbital implant left eye Mouth: MMM no lesions Lymph Nodes:  Cervical, supraclavicular, and axillary nodes normal. Lungs:  clear to auscultation bilaterally Heart:  regular rate and rhythm, S1, S2 normal, no murmur, click, rub or gallop Abdomen: soft, non-tender; bowel sounds normal; no masses,  no organomegaly CVA:  absent Genitourinary: defer exam Extremities:  extremities normal, atraumatic, no cyanosis or edema Neurologic:  negative Psychiatric:  normal mood, behavior, speech, dress, and thought processes    Assessment: Extruding Orbital Implant LEFT Eye   Plan: Exploration of Orbit, Conjunctivoplasty, Full thickness Graft, Abdominal dermis fat graft to left ophthalmic socket, Closure of Eyelid by Suture LEFT Eye   1. Discussed the risk of surgery,  and the risks of general anesthetic including MI, CVA, sudden death or even reaction to anesthetic medications. The patient understands the risks, any and all questions were answered to the patient's satisfaction. 2. Follow up: 1 week.  Date of Surgery Update (To be completed by Attending Surgeon day of surgery.)

## 2018-05-04 NOTE — Progress Notes (Signed)
Denies chest pain, shob, or cardiology visit. Below instructions provided     How to Manage Your Diabetes Before and After Surgery  Why is it important to control my blood sugar before and after surgery? . Improving blood sugar levels before and after surgery helps healing and can limit problems. . A way of improving blood sugar control is eating a healthy diet by: o  Eating less sugar and carbohydrates o  Increasing activity/exercise o  Talking with your doctor about reaching your blood sugar goals . High blood sugars (greater than 180 mg/dL) can raise your risk of infections and slow your recovery, so you will need to focus on controlling your diabetes during the weeks before surgery. . Make sure that the doctor who takes care of your diabetes knows about your planned surgery including the date and location.  How do I manage my blood sugar before surgery? . Check your blood sugar at least 4 times a day, starting 2 days before surgery, to make sure that the level is not too high or low. o Check your blood sugar the morning of your surgery when you wake up and every 2 hours until you get to the Short Stay unit. . If your blood sugar is less than 70 mg/dL, you will need to treat for low blood sugar: o Do not take insulin. o Treat a low blood sugar (less than 70 mg/dL) with  cup of clear juice (cranberry or apple), 4 glucose tablets, OR glucose gel. Recheck blood sugar in 15 minutes after treatment (to make sure it is greater than 70 mg/dL). If your blood sugar is not greater than 70 mg/dL on recheck, call [phone] o  for further instructions. . Report your blood sugar to the short stay nurse when you get to Short Stay.  . If you are admitted to the hospital after surgery: o Your blood sugar will be checked by the staff and you will probably be given insulin after surgery (instead of oral diabetes medicines) to make sure you have good blood sugar levels. o The goal for blood sugar  control after surgery is 80-180 mg/dL.              WHAT DO I DO ABOUT MY DIABETES MEDICATION?   Marland Kitchen Do not take oral diabetes medicines (pills) the morning of surgery.  . THE NIGHT BEFORE SURGERY, take __7_________ units of _Lantus__________insulin.       . THE MORNING OF SURGERY, take _____________ units of __________insulin.  . The day of surgery, do not take other diabetes injectables, including Byetta (exenatide), Bydureon (exenatide ER), Victoza (liraglutide), or Trulicity (dulaglutide).  . If your CBG is greater than 220 mg/dL, you may take  of your sliding scale (correction) dose of insulin.  Other Instructions:          Patient Signature:  Date:   Nurse Signature:  Date:   Reviewed and Endorsed by [facility] Patient Education Committee, August 2015

## 2018-05-05 ENCOUNTER — Ambulatory Visit (HOSPITAL_COMMUNITY): Payer: Medicaid Other | Admitting: Certified Registered Nurse Anesthetist

## 2018-05-05 ENCOUNTER — Encounter (HOSPITAL_COMMUNITY): Payer: Self-pay

## 2018-05-05 ENCOUNTER — Encounter (HOSPITAL_COMMUNITY)
Admission: RE | Disposition: A | Discharge status: Home / Self Care | Payer: Self-pay | Source: Ambulatory Visit | Attending: Oculoplastics Ophthalmology

## 2018-05-05 ENCOUNTER — Ambulatory Visit (HOSPITAL_COMMUNITY)
Admission: RE | Admit: 2018-05-05 | Discharge: 2018-05-05 | Disposition: A | Discharge status: Home / Self Care | Payer: Medicaid Other | Source: Ambulatory Visit | Attending: Oculoplastics Ophthalmology | Admitting: Oculoplastics Ophthalmology

## 2018-05-05 ENCOUNTER — Other Ambulatory Visit: Payer: Self-pay

## 2018-05-05 DIAGNOSIS — M1990 Unspecified osteoarthritis, unspecified site: Secondary | ICD-10-CM | POA: Insufficient documentation

## 2018-05-05 DIAGNOSIS — Z79899 Other long term (current) drug therapy: Secondary | ICD-10-CM | POA: Diagnosis not present

## 2018-05-05 DIAGNOSIS — Z992 Dependence on renal dialysis: Secondary | ICD-10-CM | POA: Diagnosis not present

## 2018-05-05 DIAGNOSIS — G4730 Sleep apnea, unspecified: Secondary | ICD-10-CM | POA: Diagnosis not present

## 2018-05-05 DIAGNOSIS — Z794 Long term (current) use of insulin: Secondary | ICD-10-CM | POA: Diagnosis not present

## 2018-05-05 DIAGNOSIS — Z87891 Personal history of nicotine dependence: Secondary | ICD-10-CM | POA: Diagnosis not present

## 2018-05-05 DIAGNOSIS — N186 End stage renal disease: Secondary | ICD-10-CM | POA: Insufficient documentation

## 2018-05-05 DIAGNOSIS — Y838 Other surgical procedures as the cause of abnormal reaction of the patient, or of later complication, without mention of misadventure at the time of the procedure: Secondary | ICD-10-CM | POA: Insufficient documentation

## 2018-05-05 DIAGNOSIS — Z6841 Body Mass Index (BMI) 40.0 and over, adult: Secondary | ICD-10-CM | POA: Diagnosis not present

## 2018-05-05 DIAGNOSIS — J449 Chronic obstructive pulmonary disease, unspecified: Secondary | ICD-10-CM | POA: Diagnosis not present

## 2018-05-05 DIAGNOSIS — T85328A Displacement of other ocular prosthetic devices, implants and grafts, initial encounter: Secondary | ICD-10-CM | POA: Insufficient documentation

## 2018-05-05 DIAGNOSIS — F418 Other specified anxiety disorders: Secondary | ICD-10-CM | POA: Insufficient documentation

## 2018-05-05 DIAGNOSIS — I509 Heart failure, unspecified: Secondary | ICD-10-CM | POA: Diagnosis not present

## 2018-05-05 DIAGNOSIS — F319 Bipolar disorder, unspecified: Secondary | ICD-10-CM | POA: Diagnosis not present

## 2018-05-05 DIAGNOSIS — E1022 Type 1 diabetes mellitus with diabetic chronic kidney disease: Secondary | ICD-10-CM | POA: Insufficient documentation

## 2018-05-05 DIAGNOSIS — I132 Hypertensive heart and chronic kidney disease with heart failure and with stage 5 chronic kidney disease, or end stage renal disease: Secondary | ICD-10-CM | POA: Diagnosis not present

## 2018-05-05 HISTORY — DX: End stage renal disease: N18.6

## 2018-05-05 HISTORY — PX: SKIN FULL THICKNESS GRAFT: SHX442

## 2018-05-05 LAB — BASIC METABOLIC PANEL
ANION GAP: 13 (ref 5–15)
BUN: 22 mg/dL — AB (ref 6–20)
CALCIUM: 9.4 mg/dL (ref 8.9–10.3)
CHLORIDE: 101 mmol/L (ref 98–111)
CO2: 23 mmol/L (ref 22–32)
CREATININE: 3.65 mg/dL — AB (ref 0.44–1.00)
GFR calc non Af Amer: 13 mL/min — ABNORMAL LOW (ref >60)
GFR, EST AFRICAN AMERICAN: 16 mL/min — AB (ref >60)
Glucose, Bld: 194 mg/dL — ABNORMAL HIGH (ref 70–99)
POTASSIUM: 3.7 mmol/L (ref 3.5–5.1)
Sodium: 137 mmol/L (ref 135–145)

## 2018-05-05 LAB — GLUCOSE, CAPILLARY
GLUCOSE-CAPILLARY: 201 mg/dL — AB (ref 70–99)
Glucose-Capillary: 164 mg/dL — ABNORMAL HIGH (ref 70–99)

## 2018-05-05 LAB — CBC
HCT: 35.8 % — ABNORMAL LOW (ref 36.0–46.0)
HEMOGLOBIN: 11.4 g/dL — AB (ref 12.0–15.0)
MCH: 29.1 pg (ref 26.0–34.0)
MCHC: 31.8 g/dL (ref 30.0–36.0)
MCV: 91.3 fL (ref 80.0–100.0)
NRBC: 0 % (ref 0.0–0.2)
Platelets: 151 10*3/uL (ref 150–400)
RBC: 3.92 MIL/uL (ref 3.87–5.11)
RDW: 15.3 % (ref 11.5–15.5)
WBC: 9.3 10*3/uL (ref 4.0–10.5)

## 2018-05-05 LAB — PROTIME-INR
INR: 1.07
Prothrombin Time: 13.9 seconds (ref 11.4–15.2)

## 2018-05-05 SURGERY — APPLICATION, GRAFT, SKIN, FULL-THICKNESS
Anesthesia: General | Site: Eye | Laterality: Left

## 2018-05-05 MED ORDER — SUCCINYLCHOLINE CHLORIDE 200 MG/10ML IV SOSY
PREFILLED_SYRINGE | INTRAVENOUS | Status: AC
  Filled 2018-05-05: qty 10

## 2018-05-05 MED ORDER — SODIUM CHLORIDE 0.9 % IV SOLN
INTRAVENOUS | Status: DC
  Administered 2018-05-05: 13:00:00 via INTRAVENOUS

## 2018-05-05 MED ORDER — PHENYLEPHRINE 40 MCG/ML (10ML) SYRINGE FOR IV PUSH (FOR BLOOD PRESSURE SUPPORT)
PREFILLED_SYRINGE | INTRAVENOUS | Status: DC | PRN
  Administered 2018-05-05: 120 ug via INTRAVENOUS
  Administered 2018-05-05 (×2): 80 ug via INTRAVENOUS

## 2018-05-05 MED ORDER — LIDOCAINE-EPINEPHRINE 1 %-1:100000 IJ SOLN
INTRAMUSCULAR | Status: DC | PRN
  Administered 2018-05-05: 16:00:00
  Administered 2018-05-05: 2.5 mL

## 2018-05-05 MED ORDER — SODIUM CHLORIDE 0.9 % IJ SOLN
INTRAMUSCULAR | Status: AC
  Filled 2018-05-05: qty 20

## 2018-05-05 MED ORDER — CEFAZOLIN SODIUM-DEXTROSE 2-3 GM-%(50ML) IV SOLR
INTRAVENOUS | Status: DC | PRN
  Administered 2018-05-05: 3 g via INTRAVENOUS

## 2018-05-05 MED ORDER — HYDROMORPHONE HCL 1 MG/ML IJ SOLN: 0.2500 mg | INTRAMUSCULAR | Status: DC | PRN

## 2018-05-05 MED ORDER — FENTANYL CITRATE (PF) 250 MCG/5ML IJ SOLN
INTRAMUSCULAR | Status: AC
  Filled 2018-05-05: qty 5

## 2018-05-05 MED ORDER — TETRACAINE HCL 0.5 % OP SOLN
OPHTHALMIC | Status: AC
  Filled 2018-05-05: qty 4

## 2018-05-05 MED ORDER — SULFAMETHOXAZOLE-TRIMETHOPRIM 800-160 MG PO TABS: 1.0000 | ORAL_TABLET | Freq: Two times a day (BID) | ORAL | 0 refills | Status: AC

## 2018-05-05 MED ORDER — LIDOCAINE 2% (20 MG/ML) 5 ML SYRINGE
INTRAMUSCULAR | Status: AC
  Filled 2018-05-05: qty 5

## 2018-05-05 MED ORDER — PROPOFOL 10 MG/ML IV BOLUS
INTRAVENOUS | Status: AC
  Filled 2018-05-05: qty 20

## 2018-05-05 MED ORDER — MIDAZOLAM HCL 5 MG/5ML IJ SOLN
INTRAMUSCULAR | Status: DC | PRN
  Administered 2018-05-05: 2 mg via INTRAVENOUS

## 2018-05-05 MED ORDER — TOBRAMYCIN-DEXAMETHASONE 0.3-0.1 % OP OINT
TOPICAL_OINTMENT | OPHTHALMIC | Status: DC | PRN
  Administered 2018-05-05: 1 via OPHTHALMIC

## 2018-05-05 MED ORDER — PROPOFOL 10 MG/ML IV BOLUS
INTRAVENOUS | Status: DC | PRN
  Administered 2018-05-05: 150 mg via INTRAVENOUS

## 2018-05-05 MED ORDER — MIDAZOLAM HCL 2 MG/2ML IJ SOLN
INTRAMUSCULAR | Status: AC
  Filled 2018-05-05: qty 2

## 2018-05-05 MED ORDER — PROMETHAZINE HCL 6.25 MG/5ML PO SYRP: 25.0000 mg | ORAL_SOLUTION | Freq: Every day | ORAL | 1 refills | Status: DC

## 2018-05-05 MED ORDER — FENTANYL CITRATE (PF) 250 MCG/5ML IJ SOLN
INTRAMUSCULAR | Status: DC | PRN
  Administered 2018-05-05: 100 ug via INTRAVENOUS

## 2018-05-05 MED ORDER — TOBRAMYCIN-DEXAMETHASONE 0.3-0.1 % OP OINT
TOPICAL_OINTMENT | OPHTHALMIC | Status: AC
  Filled 2018-05-05: qty 3.5

## 2018-05-05 MED ORDER — BUPIVACAINE HCL (PF) 0.5 % IJ SOLN
INTRAMUSCULAR | Status: AC
  Filled 2018-05-05: qty 10

## 2018-05-05 MED ORDER — PHENYLEPHRINE 40 MCG/ML (10ML) SYRINGE FOR IV PUSH (FOR BLOOD PRESSURE SUPPORT)
PREFILLED_SYRINGE | INTRAVENOUS | Status: AC
  Filled 2018-05-05: qty 10

## 2018-05-05 MED ORDER — LIDOCAINE 2% (20 MG/ML) 5 ML SYRINGE
INTRAMUSCULAR | Status: DC | PRN
  Administered 2018-05-05: 60 mg via INTRAVENOUS

## 2018-05-05 MED ORDER — SUCCINYLCHOLINE CHLORIDE 200 MG/10ML IV SOSY
PREFILLED_SYRINGE | INTRAVENOUS | Status: DC | PRN
  Administered 2018-05-05: 120 mg via INTRAVENOUS

## 2018-05-05 MED ORDER — ONDANSETRON HCL 4 MG/2ML IJ SOLN
INTRAMUSCULAR | Status: DC | PRN
  Administered 2018-05-05: 4 mg via INTRAVENOUS

## 2018-05-05 MED ORDER — BSS IO SOLN
INTRAOCULAR | Status: AC
  Filled 2018-05-05: qty 15

## 2018-05-05 MED ORDER — ONDANSETRON HCL 4 MG/2ML IJ SOLN
INTRAMUSCULAR | Status: AC
  Filled 2018-05-05: qty 2

## 2018-05-05 MED ORDER — CEFAZOLIN SODIUM 1 G IJ SOLR
INTRAMUSCULAR | Status: AC
  Filled 2018-05-05: qty 30

## 2018-05-05 MED ORDER — SODIUM CHLORIDE 0.9 % IV SOLN
INTRAVENOUS | Status: DC | PRN
  Administered 2018-05-05: 25 ug/min via INTRAVENOUS

## 2018-05-05 MED ORDER — LIDOCAINE-EPINEPHRINE 1 %-1:100000 IJ SOLN
INTRAMUSCULAR | Status: AC
  Filled 2018-05-05: qty 1

## 2018-05-05 SURGICAL SUPPLY — 47 items
APPLICATOR COTTON TIP 6 STRL (MISCELLANEOUS) ×4 IMPLANT
APPLICATOR COTTON TIP 6IN STRL (MISCELLANEOUS) ×6
APPLICATOR DR MATTHEWS STRL (MISCELLANEOUS) ×3 IMPLANT
BANDAGE EYE OVAL (MISCELLANEOUS) IMPLANT
BLADE SURG 15 STRL LF DISP TIS (BLADE) ×2 IMPLANT
BLADE SURG 15 STRL SS (BLADE) ×1
CLSR STERI-STRIP ANTIMIC 1/2X4 (GAUZE/BANDAGES/DRESSINGS) ×3 IMPLANT
CONFORMER OPHTHALMIC SM W-HOLE (MISCELLANEOUS) ×3 IMPLANT
CORD BIPOLAR FORCEPS 12FT (ELECTRODE) ×3 IMPLANT
COVER LIGHT HANDLE STERIS (MISCELLANEOUS) ×3 IMPLANT
COVER SURGICAL LIGHT HANDLE (MISCELLANEOUS) ×3 IMPLANT
COVER WAND RF STERILE (DRAPES) ×3 IMPLANT
DRAPE ORTHO SPLIT 87X125 STRL (DRAPES) ×3 IMPLANT
DRESSING TELFA 8X10 (GAUZE/BANDAGES/DRESSINGS) IMPLANT
DRSG TEGADERM 4X4.75 (GAUZE/BANDAGES/DRESSINGS) ×3 IMPLANT
ELECT NEEDLE BLADE 2-5/6 (NEEDLE) ×3 IMPLANT
ELECT REM PT RETURN 9FT ADLT (ELECTROSURGICAL) ×3
ELECTRODE REM PT RTRN 9FT ADLT (ELECTROSURGICAL) ×2 IMPLANT
FORCEPS BIPOLAR SPETZLER 8 1.0 (NEUROSURGERY SUPPLIES) ×3 IMPLANT
FRAME EYE SHIELD (PROTECTIVE WEAR) IMPLANT
GAUZE SPONGE 2X2 8PLY STRL LF (GAUZE/BANDAGES/DRESSINGS) ×2 IMPLANT
GLOVE BIO SURGEON STRL SZ7.5 (GLOVE) ×6 IMPLANT
GOWN STRL REUS W/ TWL LRG LVL3 (GOWN DISPOSABLE) ×4 IMPLANT
GOWN STRL REUS W/TWL LRG LVL3 (GOWN DISPOSABLE) ×2
KIT BASIN OR (CUSTOM PROCEDURE TRAY) ×3 IMPLANT
NEEDLE PRECISIONGLIDE 27X1.5 (NEEDLE) IMPLANT
NS IRRIG 1000ML POUR BTL (IV SOLUTION) ×3 IMPLANT
PACK CATARACT CUSTOM (CUSTOM PROCEDURE TRAY) ×3 IMPLANT
PAD ARMBOARD 7.5X6 YLW CONV (MISCELLANEOUS) ×6 IMPLANT
PENCIL BUTTON HOLSTER BLD 10FT (ELECTRODE) ×3 IMPLANT
SPONGE GAUZE 2X2 STER 10/PKG (GAUZE/BANDAGES/DRESSINGS) ×1
SUT CHROMIC 5 0 P 3 (SUTURE) IMPLANT
SUT ETHILON 6 0 P 1 (SUTURE) IMPLANT
SUT ETHILON 7 0 P 6 18 (SUTURE) IMPLANT
SUT MERSILENE 5 0 RD 1 DA (SUTURE) IMPLANT
SUT PLAIN 5 0 P 3 18 (SUTURE) IMPLANT
SUT PLAIN 6 0 TG1408 (SUTURE) IMPLANT
SUT SILK 4 0 P 3 (SUTURE) ×3 IMPLANT
SUT SILK 5 0 PS 2 (SUTURE) ×3 IMPLANT
SUT VIC AB 4-0 RB1 27 (SUTURE) ×1
SUT VIC AB 4-0 RB1 27XBRD (SUTURE) ×2 IMPLANT
SUT VIC AB 4-0 SH 27 (SUTURE) ×1
SUT VIC AB 4-0 SH 27XBRD (SUTURE) ×2 IMPLANT
SUT VIC AB 5-0 RB1 27 (SUTURE) ×3 IMPLANT
SUT VICRYL 6-0 S14 (SUTURE) IMPLANT
TOWEL NATURAL 6PK STERILE (DISPOSABLE) ×3 IMPLANT
WATER STERILE IRR 1000ML POUR (IV SOLUTION) ×3 IMPLANT

## 2018-05-05 NOTE — Anesthesia Preprocedure Evaluation (Signed)
Anesthesia Evaluation  Patient identified by MRN, date of birth, ID band Patient awake    Reviewed: Allergy & Precautions, H&P , NPO status , Patient's Chart, lab work & pertinent test results  Airway Mallampati: II  TM Distance: >3 FB Neck ROM: Full    Dental no notable dental hx. (+) Teeth Intact, Dental Advisory Given   Pulmonary sleep apnea and Continuous Positive Airway Pressure Ventilation , COPD, former smoker,    Pulmonary exam normal breath sounds clear to auscultation       Cardiovascular hypertension, Pt. on medications +CHF   Rhythm:Regular Rate:Normal     Neuro/Psych Anxiety Depression Bipolar Disorder Schizophrenia negative neurological ROS     GI/Hepatic negative GI ROS, Neg liver ROS,   Endo/Other  diabetes, Type 1, Insulin DependentMorbid obesity  Renal/GU ESRF and DialysisRenal disease  negative genitourinary   Musculoskeletal  (+) Arthritis , Osteoarthritis,    Abdominal   Peds  Hematology negative hematology ROS (+)   Anesthesia Other Findings   Reproductive/Obstetrics negative OB ROS                             Anesthesia Physical Anesthesia Plan  ASA: III  Anesthesia Plan: General   Post-op Pain Management:    Induction: Intravenous  PONV Risk Score and Plan: 4 or greater and Ondansetron, Midazolam and Treatment may vary due to age or medical condition  Airway Management Planned: Oral ETT  Additional Equipment:   Intra-op Plan:   Post-operative Plan: Extubation in OR  Informed Consent: I have reviewed the patients History and Physical, chart, labs and discussed the procedure including the risks, benefits and alternatives for the proposed anesthesia with the patient or authorized representative who has indicated his/her understanding and acceptance.   Dental advisory given  Plan Discussed with: CRNA  Anesthesia Plan Comments:          Anesthesia Quick Evaluation

## 2018-05-05 NOTE — Transfer of Care (Signed)
Immediate Anesthesia Transfer of Care Note  Patient: Martha [name]  Procedure(s) Performed: ABDOMINAL DERMIS FAT SKIN GRAFT FULL THICKNESS LEFT EYE (Left Abdomen) EXAM UNDER ANESTHESIA, EXPLORATION OF LEFT EYE SOCKET, CONJUCTIVOPLASTY, CLOSURE OF EYELID BY SUTURE LEFT EYE (Left Eye)  Patient Location: PACU  Anesthesia Type:General  Level of Consciousness: awake, alert , oriented and patient cooperative  Airway & Oxygen Therapy: Patient Spontanous Breathing and Patient connected to face mask oxygen  Post-op Assessment: Report given to RN, Post -op Vital signs reviewed and stable and Patient moving all extremities X 4  Post vital signs: Reviewed and stable  Last Vitals:  Vitals Value Taken Time  BP 153/111 05/05/2018  4:31 PM  Temp    Pulse 68 05/05/2018  4:32 PM  Resp 15 05/05/2018  4:32 PM  SpO2 100 % 05/05/2018  4:32 PM  Vitals shown include unvalidated device data.  Last Pain:  Vitals:   05/05/18 1242  TempSrc:   PainSc: 1       Patients Stated Pain Goal: 2 (10/15/20 4825)  Complications: No apparent anesthesia complications

## 2018-05-05 NOTE — Discharge Instructions (Addendum)
Please keep patch on until post operative visit. Do not get patch wet. Keep dressing on abdomen intact until post operative visit.

## 2018-05-05 NOTE — Op Note (Signed)
Procedure(s): SKIN GRAFT FULL THICKNESS LEFT EYE EXAM UNDER ANESTHESIA Procedure Note  Martha [name] female 52 y.o. 05/05/2018  Procedure(s) and Anesthesia Type:    *Exploration of Orbit, Conjunctivoplasty, Full thickness Graft, Abdominal dermis fat graft to left ophthalmic socket, Closure of Eyelid by Suture LEFT Eye   Surgeon(s) and Role:    * Clista Bernhardt, MD - Primary   Indications: The patient was admitted to the hospital with a brief history of left-sided extruding orbital implant.  An eye exam revealed the above findings The patient now presents for Exploration of Orbit, Conjunctivoplasty, Full thickness Graft, Abdominal dermis fat graft to left ophthalmic socket, Closure of Eyelid by Suture LEFT Eye  after discussing therapeutic alternatives.        Surgeon: Clista Bernhardt   Assistants: none  Anesthesia: General endotracheal anesthesia  ASA Class: 4  Procedure Detail  Exploration of Orbit, Conjunctivoplasty, Full thickness Graft, Abdominal dermis fat graft to left ophthalmic socket, Closure of Eyelid by Suture LEFT Eye   ENUCLEATION RIGHT EYE Patient is aware that she has an extrusion of the orbital implant of the left eye. The risks and benefits of the procedure were reviewed with the patient including bleeding, infection, implant extrusion, scarring, and loss of life. The patient understands and agrees to proceed with an Exploration of Orbit, Conjunctivoplasty, Full thickness Graft, Abdominal dermis fat graft to left ophthalmic socket, Closure of Eyelid by Suture LEFT Eye   The patient was transported to the operating room in supine position where they were prepped and draped in the standard fashion for oculoplastic surgery. Attention was turned to the right lower abdomen. A graft measuring 20x38mm was taken from the lower abdomen. The proposed area was marked and then the skin was denuded. The the graft was incised with a 15 blade and then the tissue was excised  with stevens scissors. Then the fat from the graft was removed.   Attention was turned to the left orbit. An exploration took place to note the extent of the extrusion and do determine the etiologyThere was copious mucopurulent discharge, This was sent for culture. Then the edges of the tissue around the extruding implant was freshened. The graft was placed inside the orbit and was sutured in a interrupted fashion with 5-0 undyed vicryl.   Then ointment was placed followed by a conformer. Then a frost suture was placed.   A retrobulbar block was performed.  Then a pressure patch was placed.   The patient tolerated the procedure well and was transported to PACU in stable condition.   Findings: Mucopurulent discharge,  Gram stain and culture sent  Estimated Blood Loss:  Minimal         Drains: none         Total IV Fluids: <2L  Blood Given: none          Specimens: none         Implants: none        Complications:  * No complications entered in OR log *         Disposition: PACU - hemodynamically stable.         Condition: stable

## 2018-05-05 NOTE — Brief Op Note (Signed)
05/05/2018  2:58 PM  PATIENT:  Martha [name]  52 y.o. female  PRE-OPERATIVE DIAGNOSIS:  BLIND PAINFUL EYE  POST-OPERATIVE DIAGNOSIS:  * No post-op diagnosis entered *  PROCEDURE:  Procedure(s): Exploration of Orbit, Conjunctivoplasty, Full thickness Graft, Abdominal dermis fat graft to left ophthalmic socket, Closure of Eyelid by Suture LEFT Eye  EXAM UNDER ANESTHESIA (Left)   SURGEON:  Surgeon(s) and Role:    * Abugo, Peyton Najjar, MD - Primary  PHYSICIAN ASSISTANT: none  ASSISTANTS: none  ANESTHESIA:   general  EBL:  10 mL   BLOOD ADMINISTERED:none  DRAINS: none   LOCAL MEDICATIONS USED:  BUPIVICAINE  and LIDOCAINE   SPECIMEN:  No Specimen  DISPOSITION OF SPECIMEN:  N/A  COUNTS:  YES  TOURNIQUET:  * No tourniquets in log *  DICTATION: .Note written in EPIC  PLAN OF CARE: Discharge to home after PACU  PATIENT DISPOSITION:  PACU - hemodynamically stable.   Delay start of Pharmacological VTE agent (>24hrs) due to surgical blood loss or risk of bleeding: no

## 2018-05-05 NOTE — Anesthesia Procedure Notes (Signed)
Procedure Name: Intubation Date/Time: 05/05/2018 3:15 PM Performed by: Colin Benton, CRNA Pre-anesthesia Checklist: Patient identified, Emergency Drugs available, Suction available and Patient being monitored Patient Re-evaluated:Patient Re-evaluated prior to induction Oxygen Delivery Method: Circle system utilized Preoxygenation: Pre-oxygenation with 100% oxygen Induction Type: IV induction Ventilation: Mask ventilation without difficulty Laryngoscope Size: Mac and 3 Grade View: Grade I Tube type: Oral Tube size: 7.0 mm Number of attempts: 1 Airway Equipment and Method: Stylet Placement Confirmation: ETT inserted through vocal cords under direct vision,  positive ETCO2 and breath sounds checked- equal and bilateral Secured at: 23 cm Tube secured with: Tape Dental Injury: Teeth and Oropharynx as per pre-operative assessment

## 2018-05-05 NOTE — Interval H&P Note (Signed)
History and Physical Interval Note:  05/05/2018 2:57 PM  Martha [name]  has presented today for surgery, with the diagnosis of BLIND PAINFUL EYE  The various methods of treatment have been discussed with the patient and family. After consideration of risks, benefits and other options for treatment, the patient has consented to  Procedure(s): SKIN GRAFT FULL THICKNESS LEFT EYE (Left) EXAM UNDER ANESTHESIA (Left) as a surgical intervention .  The patient's history has been reviewed, patient examined, no change in status, stable for surgery.  I have reviewed the patient's chart and labs.  Questions were answered to the patient's satisfaction.     Clista Bernhardt

## 2018-05-06 ENCOUNTER — Encounter (HOSPITAL_COMMUNITY): Payer: Self-pay | Admitting: Oculoplastics Ophthalmology

## 2018-05-06 NOTE — Anesthesia Postprocedure Evaluation (Signed)
Anesthesia Post Note  Patient: Martha [name]  Procedure(s) Performed: ABDOMINAL DERMIS FAT SKIN GRAFT FULL THICKNESS LEFT EYE (Left Abdomen) EXAM UNDER ANESTHESIA, EXPLORATION OF LEFT EYE SOCKET, CONJUCTIVOPLASTY, CLOSURE OF EYELID BY SUTURE LEFT EYE (Left Eye)     Patient location during evaluation: PACU Anesthesia Type: General Level of consciousness: awake and alert Pain management: pain level controlled Vital Signs Assessment: post-procedure vital signs reviewed and stable Respiratory status: spontaneous breathing, nonlabored ventilation and respiratory function stable Cardiovascular status: blood pressure returned to baseline and stable Postop Assessment: no apparent nausea or vomiting Anesthetic complications: no    Last Vitals:  Vitals:   05/05/18 1700 05/05/18 1715  BP: 135/71 134/77  Pulse: 67 65  Resp: 13   Temp:  (!) 36.4 C  SpO2: 100% 100%    Last Pain:  Vitals:   05/05/18 1715  TempSrc:   PainSc: 0-No pain                 Berley Gambrell,W. EDMOND

## 2018-05-10 LAB — AEROBIC/ANAEROBIC CULTURE (SURGICAL/DEEP WOUND)

## 2018-05-10 LAB — AEROBIC/ANAEROBIC CULTURE W GRAM STAIN (SURGICAL/DEEP WOUND)

## 2018-05-26 IMAGING — US US RENAL
1 series · 14 of 25 positions shown · non-contrast
Comparison: CT scan September 04, 2015

CLINICAL DATA: Acute renal injury

EXAM:
RENAL / URINARY TRACT ULTRASOUND COMPLETE

[Series 1: us renal · 0.30mm/px · 14 of 41 slices shown]
[im 1/41]
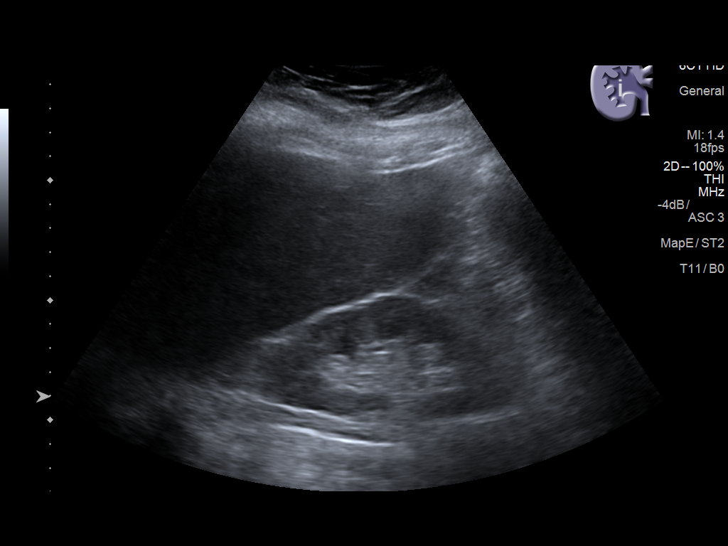
[im 4/41]
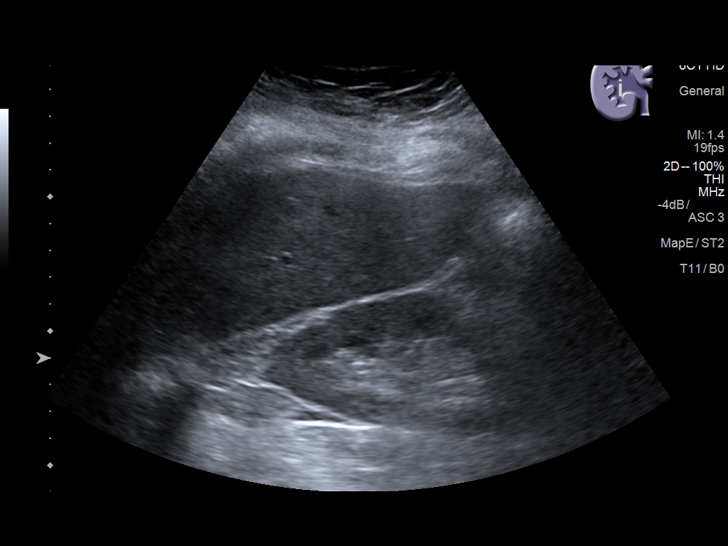
[im 7/41]
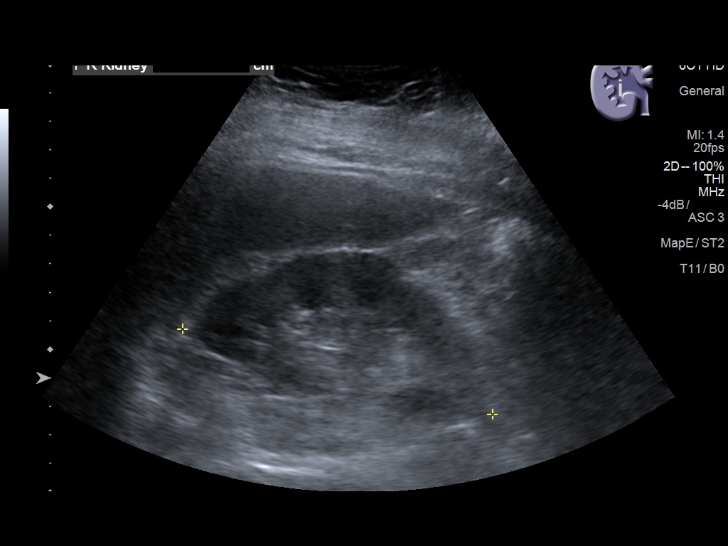
[im 11/41]
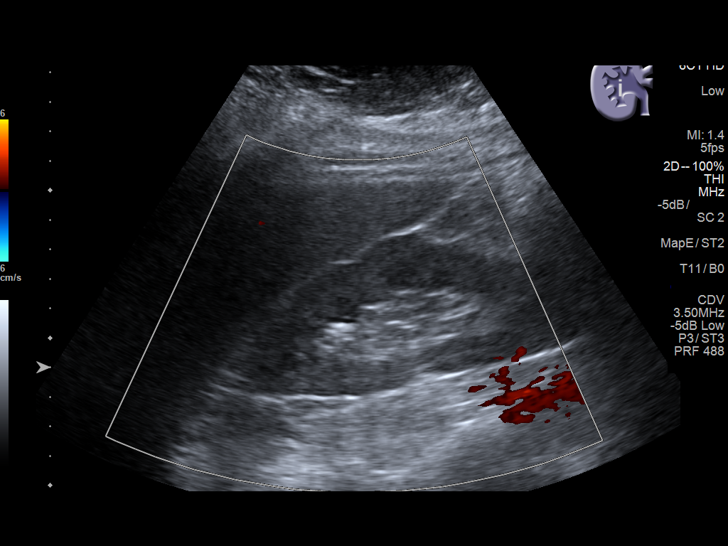
[im 14/41]
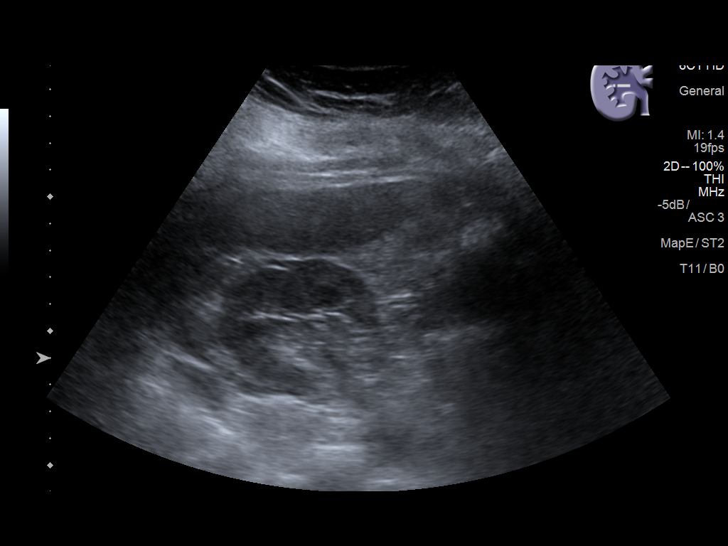
[im 16/41]
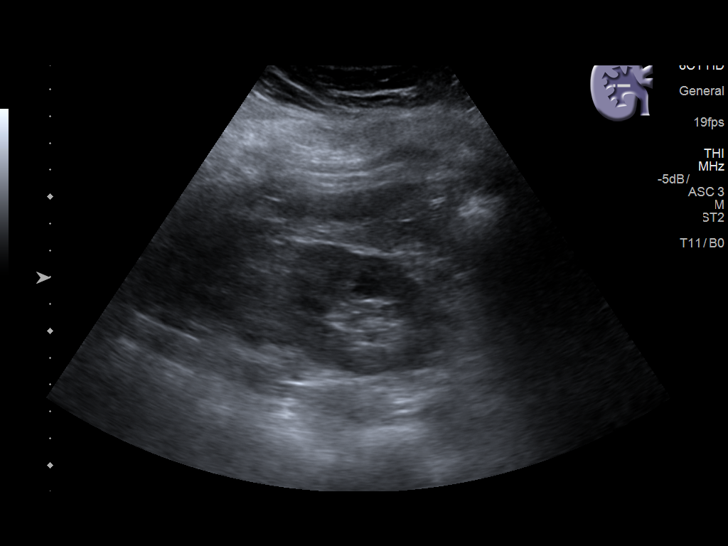
[im 19/41]
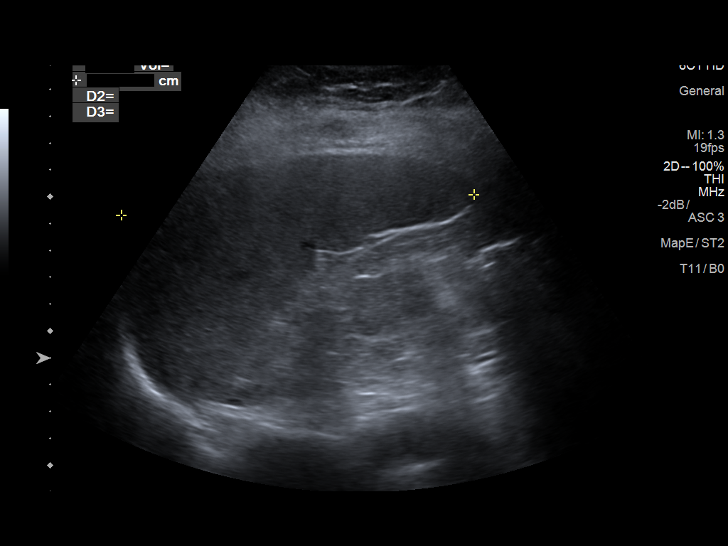
[im 22/41]
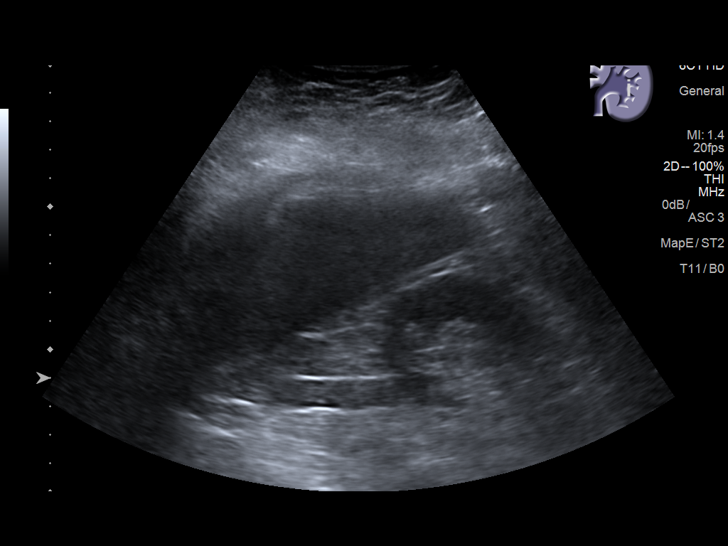
[im 26/41]
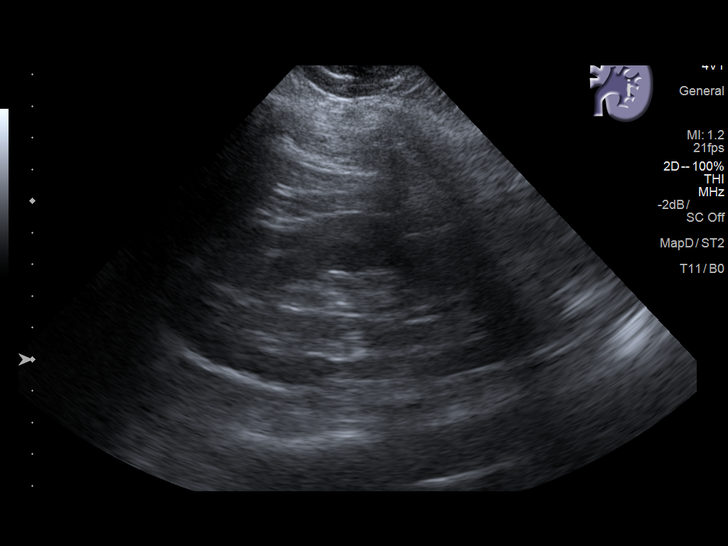
[im 27/41]
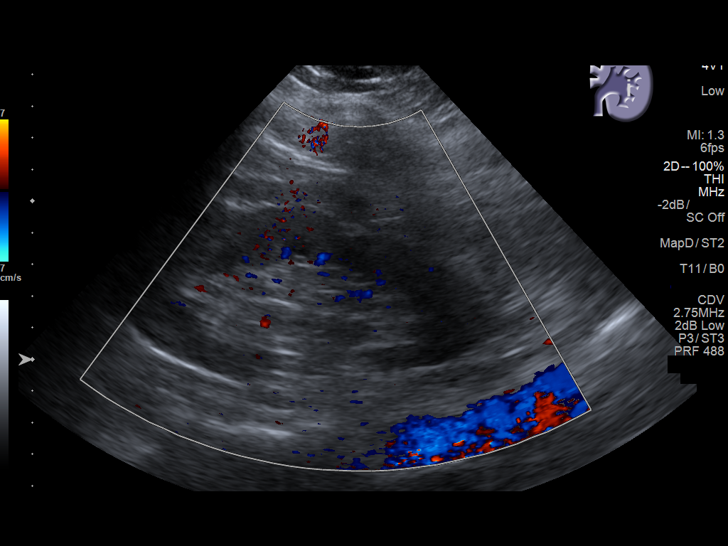
[im 31/41]
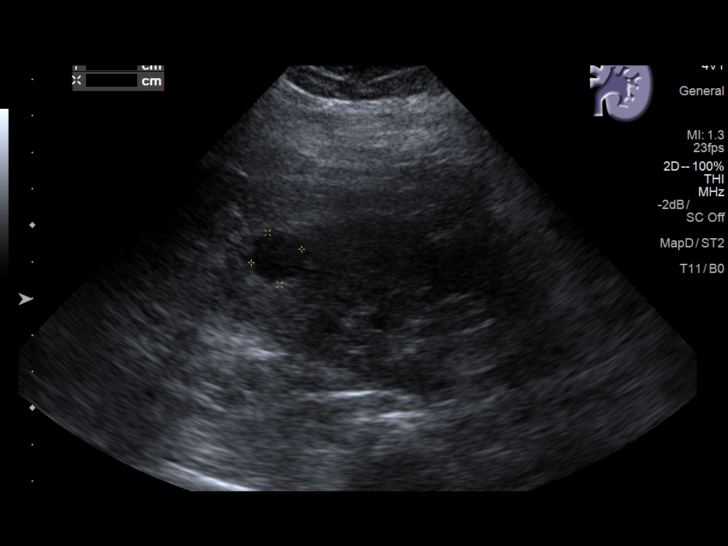
[im 34/41]
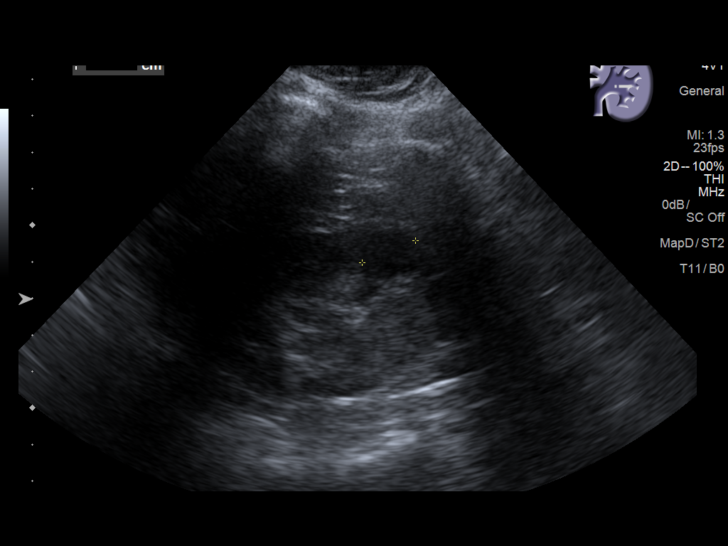
[im 37/41]
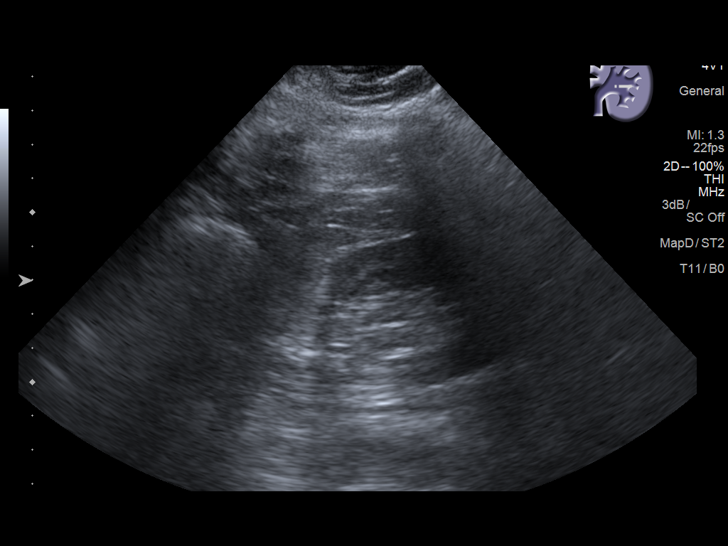
[im 41/41]
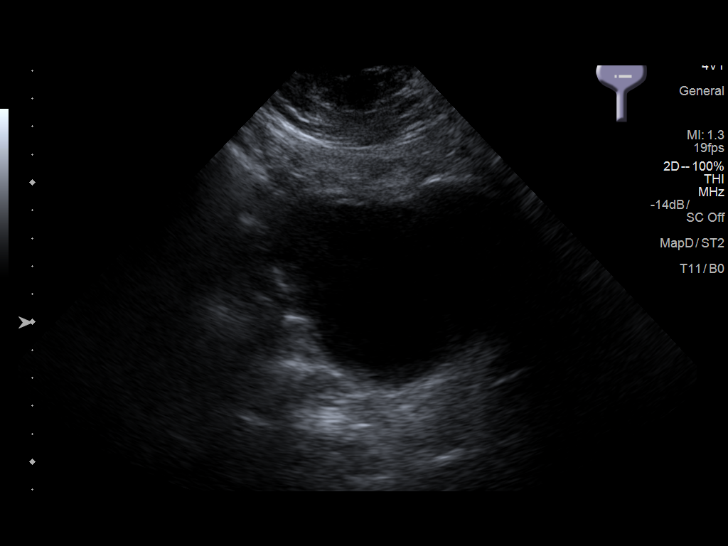

[14 of 25 positions shown; findings below may reference images not displayed]

FINDINGS: Right Kidney:

Length: 11.3 cm. 1 cm echogenic non shadowing region in the right
kidney may be artifactual. A stone is considered less likely due to
the lack of shadowing. No hydronephrosis.

Left Kidney:

Length: 11.7 cm.  1.6 cm cyst.

Bladder:

Appears normal for degree of bladder distention.

The spleen is enlarged with a volume of 608 cc and a maximum
dimension of 13.3 cm.
IMPRESSION: 1. No cause for acute renal failure identified. Non shadowing hyper
echogenicity in the upper right kidney may be artifactual versus a
non shadowing stone. No hydronephrosis. Left renal cysts.
2. Splenomegaly.

## 2018-06-03 ENCOUNTER — Other Ambulatory Visit: Payer: Self-pay | Admitting: *Deleted

## 2018-06-03 ENCOUNTER — Other Ambulatory Visit: Payer: Self-pay

## 2018-06-03 ENCOUNTER — Ambulatory Visit (INDEPENDENT_AMBULATORY_CARE_PROVIDER_SITE_OTHER): Payer: Medicaid Other | Admitting: Vascular Surgery

## 2018-06-03 ENCOUNTER — Encounter: Payer: Self-pay | Admitting: Vascular Surgery

## 2018-06-03 ENCOUNTER — Encounter: Payer: Self-pay | Admitting: *Deleted

## 2018-06-03 ENCOUNTER — Ambulatory Visit (HOSPITAL_COMMUNITY)
Admission: RE | Admit: 2018-06-03 | Discharge: 2018-06-03 | Disposition: A | Discharge status: Home / Self Care | Payer: Medicaid Other | Source: Ambulatory Visit | Attending: Pulmonary Disease | Admitting: Pulmonary Disease

## 2018-06-03 VITALS — BP 174/94 | HR 65 | Temp 97.2°F | Resp 16 | Ht 67.0 in | Wt 330.0 lb

## 2018-06-03 DIAGNOSIS — N186 End stage renal disease: Secondary | ICD-10-CM

## 2018-06-03 DIAGNOSIS — G458 Other transient cerebral ischemic attacks and related syndromes: Secondary | ICD-10-CM

## 2018-06-03 DIAGNOSIS — Z992 Dependence on renal dialysis: Secondary | ICD-10-CM

## 2018-06-03 NOTE — H&P (View-Only) (Signed)
Referring Physician: Dr Juleen China  Patient name: Martha [name] MRN: [id number] DOB: May 28, 1966 Sex: female  REASON FOR CONSULT: Right hand numbness  HPI: Martha [name] is a 52 y.o. female, with a long history of numbness and tingling in her right hand after placement of a right brachiocephalic AV fistula July 5102.  She has had intermittent numbness and tingling since that time.  The numbness and tingling is now persistent.  It is worse on dialysis.  She saw me in January 2019 and we were going to ligate the fistula.  However at the last minute she decided to see if she can tolerate it longer.  She states that she can no longer tolerate the pain numbness and tingling in her right hand at this point.  He is on dialysis Monday Wednesday Friday at [facility].  Her nephrologist is Dr. Juleen China.  She has had no other prior access procedures.  Other medical problems include left eye blindness status post enucleation, congestive failure, COPD, diabetes, obesity.  All of these are currently stable.  Past Medical History:  Diagnosis Date  . Acid reflux    takes Tums  . Anemia   . Arthritis   . Bipolar 1 disorder ([place])   . Blindness of left eye   . Cervical radiculopathy   . CHF (congestive heart failure) ([place])   . Chronic kidney disease    Stage 5- 01/25/17  . COPD (chronic obstructive pulmonary disease) ([place]) 2014   bronchitis  . Degenerative disc disease, thoracic   . Depression   . Diabetes mellitus    Type II  . Diabetic retinopathy ([place])   . End stage kidney disease ([place])    M, W, F [facility]  . Hypertension   . Noncompliance with medication regimen   . Noncompliance with medication regimen   . Obesity (BMI 30-39.9)   . OSA (obstructive sleep apnea)    cpap  . Panic attack   . RLS (restless legs syndrome)    Past Surgical History:  Procedure Laterality Date  . AV FISTULA PLACEMENT Right 01/27/2017   Procedure: ARTERIOVENOUS (AV) FISTULA CREATION-RIGHT ARM;  Surgeon:  Elam Dutch, MD;  Location: [facility];  Service: Vascular;  Laterality: Right;  . BIOPSY N/A 05/17/2013   Procedure: BIOPSY;  Surgeon: [name], MD;  Location: [facility];  Service: Endoscopy;  Laterality: N/A;  . CHOLECYSTECTOMY N/A 04/27/2014   Procedure: LAPAROSCOPIC CHOLECYSTECTOMY;  Surgeon: [name], MD;  Location: [facility];  Service: General;  Laterality: N/A;  . COLONOSCOPY  06/06/2010   HEN:IDPOEU bleeding secondary to internal hemorrhoids but incomplete evaluation secondary to poor right colon prep/small rectal and sigmoid colon polyps (hyperplastic). PROPOFOL  . COLONOSCOPY  May 2013   Dr. Gilliam/[facility]: 5 mm a descending colon polyp, hyperplastic. Adequate bowel prep.  . ENDOMETRIAL ABLATION    . 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 ENUCLEATION Left 02/04/2018   Procedure: ENUCLEATION WITH PLACEMENT OF IMPLANT LEFT EYE;  Surgeon: Clista Bernhardt, MD;  Location: [facility];  Service: Ophthalmology;  Laterality: Left;  . ESOPHAGOGASTRODUODENOSCOPY  06/06/2010   MPN:TIRWERX erythema and edema of body of stomach, with sessile polypoid lesions. bx benign. no h.pylori  . ESOPHAGOGASTRODUODENOSCOPY (EGD) WITH PROPOFOL N/A 05/17/2013   Dr. Oneida Alar: normal esophagus, moderate nodular gastritis, negative path, empiric Savary dilation  . EYE SURGERY  07/2017   sx for glaucoma  . FLEXIBLE SIGMOIDOSCOPY N/A 05/17/2013  Dr. Oneida Alar: moderate sized internal hemorrhoids  . HEMORRHOID BANDING N/A 05/17/2013   Procedure: HEMORRHOID BANDING;  Surgeon: [name], MD;  Location: [facility];  Service: Endoscopy;  Laterality: N/A;  2 bands placed  . INCISION AND DRAINAGE ABSCESS Left 10/11/2013   Procedure: INCISION AND DRAINAGE AND DEBRIDEMENT LEFT BREAST  ABSCESS;  Surgeon: [name], MD;  Location: [facility];  Service: General;  Laterality: Left;  . IRRIGATION AND DEBRIDEMENT ABSCESS Right 06/01/2013   Procedure: INCISION AND DRAINAGE AND  DEBRIDEMENT ABSCESS RIGHT BREAST;  Surgeon: [name], MD;  Location: [facility];  Service: General;  Laterality: Right;  . LEFT EYE REMOVED Left 01/2018   [facility] on [address].  Marland Kitchen SAVORY DILATION N/A 05/17/2013   Procedure: SAVORY DILATION;  Surgeon: [name], MD;  Location: [facility];  Service: Endoscopy;  Laterality: N/A;  14/15/16  . SKIN FULL THICKNESS GRAFT Left 05/05/2018   Procedure: ABDOMINAL DERMIS FAT SKIN GRAFT FULL THICKNESS LEFT EYE;  Surgeon: Clista Bernhardt, MD;  Location: [facility];  Service: Ophthalmology;  Laterality: Left;  . TUBAL LIGATION      Family History  Adopted: Yes  Family history unknown: Yes    SOCIAL HISTORY: Social History   Socioeconomic History  . Marital status: Divorced    Spouse name: Not on file  . Number of children: 2  . Years of education: Not on file  . Highest education level: Not on file  Occupational History  . Occupation: Disabled  Social Needs  . Financial resource strain: Not on file  . Food insecurity:    Worry: Not on file    Inability: Not on file  . Transportation needs:    Medical: Not on file    Non-medical: Not on file  Tobacco Use  . Smoking status: Former Smoker    Packs/day: 1.50    Years: 25.00    Pack years: 37.50    Types: Cigarettes    Last attempt to quit: 10/23/2007    Years since quitting: 10.6  . Smokeless tobacco: Never Used  . Tobacco comment: Quit x 10 years ago  Substance and Sexual Activity  . Alcohol use: No  . Drug use: No  . Sexual activity: Yes    Birth control/protection: Surgical  Lifestyle  . Physical activity:    Days per week: Not on file    Minutes per session: Not on file  . Stress: Not on file  Relationships  . Social connections:    Talks on phone: Not on file    Gets together: Not on file    Attends religious service: Not on file    Active member of club or organization: Not on file    Attends meetings of clubs or organizations: Not on file     Relationship status: Not on file  . Intimate partner violence:    Fear of current or ex partner: Not on file    Emotionally abused: Not on file    Physically abused: Not on file    Forced sexual activity: Not on file  Other Topics Concern  . Not on file  Social History Narrative   Lives with fiance.      Allergies  Allergen Reactions  . Tape Other (See Comments)    UNSPECIFIED REACTION  [Plastic Tape]  . Codeine Nausea And Vomiting    Current Outpatient Medications  Medication Sig Dispense Refill  . ALPRAZolam (XANAX) 1 MG tablet Take 1 mg by mouth  4 (four) times daily.     Marland Kitchen atorvastatin (LIPITOR) 20 MG tablet Take 20 mg by mouth daily.     . busPIRone (BUSPAR) 7.5 MG tablet Take 7.5 mg by mouth 2 (two) times daily.    . calcium carbonate (TUMS EX) 750 MG chewable tablet Chew 1-3 tablets by mouth See admin instructions. Patient takes 3 tablets with meals and 1 tablet with snacks three times a day    . colchicine 0.6 MG tablet Take 1 tablet (0.6 mg total) by mouth every other day. 16 tablet 12  . dicyclomine (BENTYL) 10 MG capsule Take 1 capsule (10 mg total) by mouth 4 (four) times daily -  before meals and at bedtime. 120 capsule 3  . gabapentin (NEURONTIN) 300 MG capsule Take 300 mg by mouth 2 (two) times daily.    Marland Kitchen HYDROcodone-acetaminophen (NORCO) 10-325 MG tablet Take 1 tablet by mouth every 6 (six) hours as needed for moderate pain. (Patient taking differently: Take 1 tablet by mouth 4 (four) times daily. ) 6 tablet 0  . Insulin Glargine (LANTUS SOLOSTAR) 100 UNIT/ML Solostar Pen Inject 15 Units into the skin at bedtime. 15 mL 11  . lidocaine-prilocaine (EMLA) cream Apply 1 application topically as needed (topical anesthesia for hemodialysis if Gebauers and Lidocaine injection are ineffective.). 30 g 0  . PARoxetine (PAXIL) 20 MG tablet Take 1 daily. This is to prevent panic attacks (Patient taking differently: Take 20-40 mg by mouth daily. This is to prevent panic attacks) 30  tablet 12  . torsemide (DEMADEX) 20 MG tablet Take 20 mg by mouth daily.    . promethazine (PHENERGAN) 6.25 MG/5ML syrup Take 20 mLs (25 mg total) by mouth at bedtime for 3 days. 120 mL 1   No current facility-administered medications for this visit.     ROS:   General:  No weight loss, Fever, chills  HEENT: No recent headaches, no nasal bleeding, no visual changes, no sore throat  Neurologic: No dizziness, blackouts, seizures. No recent symptoms of stroke or mini- stroke. No recent episodes of slurred speech, or temporary blindness.  Cardiac: No recent episodes of chest pain/pressure, no shortness of breath at rest.  No shortness of breath with exertion.  Denies history of atrial fibrillation or irregular heartbeat  Vascular: No history of rest pain in feet.  No history of claudication.  No history of non-healing ulcer, No history of DVT   Pulmonary: No home oxygen, no productive cough, no hemoptysis,  No asthma or wheezing  Musculoskeletal:  [ ]  Arthritis, [ ]  Low back pain,  [ ]  Joint pain  Hematologic:No history of hypercoagulable state.  No history of easy bleeding.  No history of anemia  Gastrointestinal: No hematochezia or melena,  No gastroesophageal reflux, no trouble swallowing  Urinary: [X]  chronic Kidney disease, [X]  on HD - [X]  MWF or [ ]  TTHS, [ ]  Burning with urination, [ ]  Frequent urination, [ ]  Difficulty urinating;   Skin: No rashes  Psychological: No history of anxiety,  No history of depression   Physical Examination  Vitals:   06/03/18 1349  BP: (!) 174/94  Pulse: 65  Resp: 16  Temp: (!) 97.2 F (36.2 C)  TempSrc: Oral  SpO2: 100%  Weight: (!) 330 lb (149.7 kg)  Height: 5\' 7"  (1.702 m)    Body mass index is 51.69 kg/m.  General:  Alert and oriented, no acute distress HEENT: Normal Neck: No bruit or JVD Pulmonary: Clear to auscultation bilaterally Cardiac: Regular Rate and  Rhythm  Abdomen: Soft, non-tender, non-distended, no mass,  obese Skin: No rash Extremity Pulses: Palpable thrill right brachiocephalic AV fistula.  No palpable radial pulse no ulcers on the fingertips Musculoskeletal: No deformity or edema  Neurologic: Upper and lower extremity motor 5/5 and symmetric  DATA:  Patient had a steal study performed today which shows the flow to her right hand diminishes by 50% with compression of the AV fistula.  I also reviewed the patient's previous vein mapping ultrasound from 1 year ago.  This showed an adequate cephalic vein in the left upper arm that we could consider for placement of a new hemodialysis access.  ASSESSMENT: Ischemic steal right hand with symptoms of numbness tingling and weakness which are no longer tolerable to the patient.  I do not believe that she would benefit from banding due to the fact this is her dominant hand and she is fairly frustrated by it.  I do not believe she is a very good candidate for a drill procedure due to her obesity and the morbidity associated with saphenous vein harvesting.  She may be a candidate for a left brachiocephalic fistula in the future after her right arm issues have been resolved.  I did discuss with her today that she would be at risk of developing a steal in the left arm as well.   PLAN: Ligation of right brachiocephalic AV fistula and placement of palindrome dialysis catheter next Tuesday, November 12 my partner Dr. Anice Paganini.  The patient will need follow-up postoperatively for consideration of placement of a new hemodialysis access.  She will need a repeat vein mapping of her left arm since this 76 is over a year old has had multiple needle sticks since that vein map  Ruta Hinds, MD [facility]: [phone] Pager: [phone]

## 2018-06-03 NOTE — Progress Notes (Signed)
Referring Physician: Dr Juleen China  Patient name: Martha [name] MRN: [id number] DOB: 1965/10/15 Sex: female  REASON FOR CONSULT: Right hand numbness  HPI: Martha [name] is a 52 y.o. female, with a long history of numbness and tingling in her right hand after placement of a right brachiocephalic AV fistula July 8676.  She has had intermittent numbness and tingling since that time.  The numbness and tingling is now persistent.  It is worse on dialysis.  She saw me in January 2019 and we were going to ligate the fistula.  However at the last minute she decided to see if she can tolerate it longer.  She states that she can no longer tolerate the pain numbness and tingling in her right hand at this point.  He is on dialysis Monday Wednesday Friday at [facility].  Her nephrologist is Dr. Juleen China.  She has had no other prior access procedures.  Other medical problems include left eye blindness status post enucleation, congestive failure, COPD, diabetes, obesity.  All of these are currently stable.  Past Medical History:  Diagnosis Date  . Acid reflux    takes Tums  . Anemia   . Arthritis   . Bipolar 1 disorder ([place])   . Blindness of left eye   . Cervical radiculopathy   . CHF (congestive heart failure) ([place])   . Chronic kidney disease    Stage 5- 01/25/17  . COPD (chronic obstructive pulmonary disease) ([place]) 2014   bronchitis  . Degenerative disc disease, thoracic   . Depression   . Diabetes mellitus    Type II  . Diabetic retinopathy ([place])   . End stage kidney disease ([place])    M, W, F [facility]  . Hypertension   . Noncompliance with medication regimen   . Noncompliance with medication regimen   . Obesity (BMI 30-39.9)   . OSA (obstructive sleep apnea)    cpap  . Panic attack   . RLS (restless legs syndrome)    Past Surgical History:  Procedure Laterality Date  . AV FISTULA PLACEMENT Right 01/27/2017   Procedure: ARTERIOVENOUS (AV) FISTULA CREATION-RIGHT ARM;  Surgeon:  Elam Dutch, MD;  Location: [place];  Service: Vascular;  Laterality: Right;  . BIOPSY N/A 05/17/2013   Procedure: BIOPSY;  Surgeon: [name], MD;  Location[facility];  Service: Endoscopy;  Laterality: N/A;  . CHOLECYSTECTOMY N/A 04/27/2014   Procedure: LAPAROSCOPIC CHOLECYSTECTOMY;  Surgeon: [name], MD;  Location: [facility];  Service: General;  Laterality: N/A;  . COLONOSCOPY  06/06/2010   PPJ:KDTOIZ bleeding secondary to internal hemorrhoids but incomplete evaluation secondary to poor right colon prep/small rectal and sigmoid colon polyps (hyperplastic). PROPOFOL  . COLONOSCOPY  May 2013   Dr. Gilliam/[facility]: 5 mm a descending colon polyp, hyperplastic. Adequate bowel prep.  . ENDOMETRIAL ABLATION    . 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 ENUCLEATION Left 02/04/2018   Procedure: ENUCLEATION WITH PLACEMENT OF IMPLANT LEFT EYE;  Surgeon: Clista Bernhardt, MD;  Location: [place];  Service: Ophthalmology;  Laterality: Left;  . ESOPHAGOGASTRODUODENOSCOPY  06/06/2010   TIW:PYKDXIP erythema and edema of body of stomach, with sessile polypoid lesions. bx benign. no h.pylori  . ESOPHAGOGASTRODUODENOSCOPY (EGD) WITH PROPOFOL N/A 05/17/2013   Dr. Oneida Alar: normal esophagus, moderate nodular gastritis, negative path, empiric Savary dilation  . EYE SURGERY  07/2017   sx for glaucoma  . FLEXIBLE SIGMOIDOSCOPY N/A 05/17/2013  Dr. Oneida Alar: moderate sized internal hemorrhoids  . HEMORRHOID BANDING N/A 05/17/2013   Procedure: HEMORRHOID BANDING;  Surgeon: [name], MD;  Location: [facility];  Service: Endoscopy;  Laterality: N/A;  2 bands placed  . INCISION AND DRAINAGE ABSCESS Left 10/11/2013   Procedure: INCISION AND DRAINAGE AND DEBRIDEMENT LEFT BREAST  ABSCESS;  Surgeon: [name], MD;  Location: [facility];  Service: General;  Laterality: Left;  . IRRIGATION AND DEBRIDEMENT ABSCESS Right 06/01/2013   Procedure: INCISION AND DRAINAGE AND  DEBRIDEMENT ABSCESS RIGHT BREAST;  Surgeon: [name], MD;  Location: [facility];  Service: General;  Laterality: Right;  . LEFT EYE REMOVED Left 01/2018   [facility] on [address].  Marland Kitchen SAVORY DILATION N/A 05/17/2013   Procedure: SAVORY DILATION;  Surgeon: [name], MD;  Location: [facility];  Service: Endoscopy;  Laterality: N/A;  14/15/16  . SKIN FULL THICKNESS GRAFT Left 05/05/2018   Procedure: ABDOMINAL DERMIS FAT SKIN GRAFT FULL THICKNESS LEFT EYE;  Surgeon: Clista Bernhardt, MD;  Location: [place];  Service: Ophthalmology;  Laterality: Left;  . TUBAL LIGATION      Family History  Adopted: Yes  Family history unknown: Yes    SOCIAL HISTORY: Social History   Socioeconomic History  . Marital status: Divorced    Spouse name: Not on file  . Number of children: 2  . Years of education: Not on file  . Highest education level: Not on file  Occupational History  . Occupation: Disabled  Social Needs  . Financial resource strain: Not on file  . Food insecurity:    Worry: Not on file    Inability: Not on file  . Transportation needs:    Medical: Not on file    Non-medical: Not on file  Tobacco Use  . Smoking status: Former Smoker    Packs/day: 1.50    Years: 25.00    Pack years: 37.50    Types: Cigarettes    Last attempt to quit: 10/23/2007    Years since quitting: 10.6  . Smokeless tobacco: Never Used  . Tobacco comment: Quit x 10 years ago  Substance and Sexual Activity  . Alcohol use: No  . Drug use: No  . Sexual activity: Yes    Birth control/protection: Surgical  Lifestyle  . Physical activity:    Days per week: Not on file    Minutes per session: Not on file  . Stress: Not on file  Relationships  . Social connections:    Talks on phone: Not on file    Gets together: Not on file    Attends religious service: Not on file    Active member of club or organization: Not on file    Attends meetings of clubs or organizations: Not on file     Relationship status: Not on file  . Intimate partner violence:    Fear of current or ex partner: Not on file    Emotionally abused: Not on file    Physically abused: Not on file    Forced sexual activity: Not on file  Other Topics Concern  . Not on file  Social History Narrative   Lives with fiance.      Allergies  Allergen Reactions  . Tape Other (See Comments)    UNSPECIFIED REACTION  [Plastic Tape]  . Codeine Nausea And Vomiting    Current Outpatient Medications  Medication Sig Dispense Refill  . ALPRAZolam (XANAX) 1 MG tablet Take 1 mg by mouth  4 (four) times daily.     Marland Kitchen atorvastatin (LIPITOR) 20 MG tablet Take 20 mg by mouth daily.     . busPIRone (BUSPAR) 7.5 MG tablet Take 7.5 mg by mouth 2 (two) times daily.    . calcium carbonate (TUMS EX) 750 MG chewable tablet Chew 1-3 tablets by mouth See admin instructions. Patient takes 3 tablets with meals and 1 tablet with snacks three times a day    . colchicine 0.6 MG tablet Take 1 tablet (0.6 mg total) by mouth every other day. 16 tablet 12  . dicyclomine (BENTYL) 10 MG capsule Take 1 capsule (10 mg total) by mouth 4 (four) times daily -  before meals and at bedtime. 120 capsule 3  . gabapentin (NEURONTIN) 300 MG capsule Take 300 mg by mouth 2 (two) times daily.    Marland Kitchen HYDROcodone-acetaminophen (NORCO) 10-325 MG tablet Take 1 tablet by mouth every 6 (six) hours as needed for moderate pain. (Patient taking differently: Take 1 tablet by mouth 4 (four) times daily. ) 6 tablet 0  . Insulin Glargine (LANTUS SOLOSTAR) 100 UNIT/ML Solostar Pen Inject 15 Units into the skin at bedtime. 15 mL 11  . lidocaine-prilocaine (EMLA) cream Apply 1 application topically as needed (topical anesthesia for hemodialysis if Gebauers and Lidocaine injection are ineffective.). 30 g 0  . PARoxetine (PAXIL) 20 MG tablet Take 1 daily. This is to prevent panic attacks (Patient taking differently: Take 20-40 mg by mouth daily. This is to prevent panic attacks) 30  tablet 12  . torsemide (DEMADEX) 20 MG tablet Take 20 mg by mouth daily.    . promethazine (PHENERGAN) 6.25 MG/5ML syrup Take 20 mLs (25 mg total) by mouth at bedtime for 3 days. 120 mL 1   No current facility-administered medications for this visit.     ROS:   General:  No weight loss, Fever, chills  HEENT: No recent headaches, no nasal bleeding, no visual changes, no sore throat  Neurologic: No dizziness, blackouts, seizures. No recent symptoms of stroke or mini- stroke. No recent episodes of slurred speech, or temporary blindness.  Cardiac: No recent episodes of chest pain/pressure, no shortness of breath at rest.  No shortness of breath with exertion.  Denies history of atrial fibrillation or irregular heartbeat  Vascular: No history of rest pain in feet.  No history of claudication.  No history of non-healing ulcer, No history of DVT   Pulmonary: No home oxygen, no productive cough, no hemoptysis,  No asthma or wheezing  Musculoskeletal:  [ ]  Arthritis, [ ]  Low back pain,  [ ]  Joint pain  Hematologic:No history of hypercoagulable state.  No history of easy bleeding.  No history of anemia  Gastrointestinal: No hematochezia or melena,  No gastroesophageal reflux, no trouble swallowing  Urinary: [X]  chronic Kidney disease, [X]  on HD - [X]  MWF or [ ]  TTHS, [ ]  Burning with urination, [ ]  Frequent urination, [ ]  Difficulty urinating;   Skin: No rashes  Psychological: No history of anxiety,  No history of depression   Physical Examination  Vitals:   06/03/18 1349  BP: (!) 174/94  Pulse: 65  Resp: 16  Temp: (!) 97.2 F (36.2 C)  TempSrc: Oral  SpO2: 100%  Weight: (!) 330 lb (149.7 kg)  Height: 5\' 7"  (1.702 m)    Body mass index is 51.69 kg/m.  General:  Alert and oriented, no acute distress HEENT: Normal Neck: No bruit or JVD Pulmonary: Clear to auscultation bilaterally Cardiac: Regular Rate and  Rhythm  Abdomen: Soft, non-tender, non-distended, no mass,  obese Skin: No rash Extremity Pulses: Palpable thrill right brachiocephalic AV fistula.  No palpable radial pulse no ulcers on the fingertips Musculoskeletal: No deformity or edema  Neurologic: Upper and lower extremity motor 5/5 and symmetric  DATA:  Patient had a steal study performed today which shows the flow to her right hand diminishes by 50% with compression of the AV fistula.  I also reviewed the patient's previous vein mapping ultrasound from 1 year ago.  This showed an adequate cephalic vein in the left upper arm that we could consider for placement of a new hemodialysis access.  ASSESSMENT: Ischemic steal right hand with symptoms of numbness tingling and weakness which are no longer tolerable to the patient.  I do not believe that she would benefit from banding due to the fact this is her dominant hand and she is fairly frustrated by it.  I do not believe she is a very good candidate for a drill procedure due to her obesity and the morbidity associated with saphenous vein harvesting.  She may be a candidate for a left brachiocephalic fistula in the future after her right arm issues have been resolved.  I did discuss with her today that she would be at risk of developing a steal in the left arm as well.   PLAN: Ligation of right brachiocephalic AV fistula and placement of palindrome dialysis catheter next Tuesday, November 12 my partner Dr. Anice Paganini.  The patient will need follow-up postoperatively for consideration of placement of a new hemodialysis access.  She will need a repeat vein mapping of her left arm since this 41 is over a year old has had multiple needle sticks since that vein map  Ruta Hinds, MD [facility] [place]: [phone] Pager: [phone]

## 2018-06-04 ENCOUNTER — Encounter (HOSPITAL_COMMUNITY): Payer: Self-pay | Admitting: *Deleted

## 2018-06-04 ENCOUNTER — Other Ambulatory Visit: Payer: Self-pay

## 2018-06-04 NOTE — Progress Notes (Signed)
Martha [name] denies chest pain or shortness of breath.  Patient has Type II diabetic, patient states that CBG runs 130 - 200.  Patient states that she takes a sliding scale of Insulin. Patient cant see to read pen, our pharmacy called patient's pharmacy and was told that patient has a Lantus pen.  Martha [name] reports that she has an appointment 06/29/18 with a diabetic specialist in [place].  PCP is Dr Luan Pulling. I instructed patient to take 1/2 of Insulin at bedtime Monday and to not take any Insulin on Tuesday am. I instructed patient to check CBG after awaking and every 2 hours until arrival  to the hospital.  I Instructed patient if CBG is less than 70 to drink  4 oz of a clear juice. Recheck CBG in 15 minutes then call pre- op desk at [phone] for further instructions.

## 2018-06-07 MED ORDER — DEXTROSE 5 % IV SOLN
3.0000 g | INTRAVENOUS | Status: AC
  Administered 2018-06-08: 3 g via INTRAVENOUS
  Filled 2018-06-07: qty 3

## 2018-06-08 ENCOUNTER — Ambulatory Visit (HOSPITAL_COMMUNITY): Payer: Medicaid Other

## 2018-06-08 ENCOUNTER — Other Ambulatory Visit: Payer: Self-pay

## 2018-06-08 ENCOUNTER — Encounter (HOSPITAL_COMMUNITY)
Admission: RE | Disposition: A | Discharge status: Home / Self Care | Payer: Self-pay | Source: Ambulatory Visit | Attending: Vascular Surgery

## 2018-06-08 ENCOUNTER — Encounter (HOSPITAL_COMMUNITY): Payer: Self-pay | Admitting: General Practice

## 2018-06-08 ENCOUNTER — Ambulatory Visit (HOSPITAL_COMMUNITY)
Admission: RE | Admit: 2018-06-08 | Discharge: 2018-06-08 | Disposition: A | Discharge status: Home / Self Care | Payer: Medicaid Other | Source: Ambulatory Visit | Attending: Vascular Surgery | Admitting: Vascular Surgery

## 2018-06-08 ENCOUNTER — Ambulatory Visit (HOSPITAL_COMMUNITY): Payer: Medicaid Other | Admitting: Anesthesiology

## 2018-06-08 DIAGNOSIS — J449 Chronic obstructive pulmonary disease, unspecified: Secondary | ICD-10-CM | POA: Diagnosis not present

## 2018-06-08 DIAGNOSIS — E1122 Type 2 diabetes mellitus with diabetic chronic kidney disease: Secondary | ICD-10-CM | POA: Diagnosis not present

## 2018-06-08 DIAGNOSIS — Y832 Surgical operation with anastomosis, bypass or graft as the cause of abnormal reaction of the patient, or of later complication, without mention of misadventure at the time of the procedure: Secondary | ICD-10-CM | POA: Insufficient documentation

## 2018-06-08 DIAGNOSIS — Z79899 Other long term (current) drug therapy: Secondary | ICD-10-CM | POA: Diagnosis not present

## 2018-06-08 DIAGNOSIS — I132 Hypertensive heart and chronic kidney disease with heart failure and with stage 5 chronic kidney disease, or end stage renal disease: Secondary | ICD-10-CM | POA: Diagnosis not present

## 2018-06-08 DIAGNOSIS — G4733 Obstructive sleep apnea (adult) (pediatric): Secondary | ICD-10-CM | POA: Diagnosis not present

## 2018-06-08 DIAGNOSIS — Z794 Long term (current) use of insulin: Secondary | ICD-10-CM | POA: Diagnosis not present

## 2018-06-08 DIAGNOSIS — H5462 Unqualified visual loss, left eye, normal vision right eye: Secondary | ICD-10-CM | POA: Diagnosis not present

## 2018-06-08 DIAGNOSIS — N186 End stage renal disease: Secondary | ICD-10-CM | POA: Diagnosis not present

## 2018-06-08 DIAGNOSIS — K219 Gastro-esophageal reflux disease without esophagitis: Secondary | ICD-10-CM | POA: Insufficient documentation

## 2018-06-08 DIAGNOSIS — T82898A Other specified complication of vascular prosthetic devices, implants and grafts, initial encounter: Secondary | ICD-10-CM | POA: Diagnosis not present

## 2018-06-08 DIAGNOSIS — Z992 Dependence on renal dialysis: Secondary | ICD-10-CM | POA: Diagnosis not present

## 2018-06-08 DIAGNOSIS — Z9989 Dependence on other enabling machines and devices: Secondary | ICD-10-CM | POA: Insufficient documentation

## 2018-06-08 DIAGNOSIS — I509 Heart failure, unspecified: Secondary | ICD-10-CM | POA: Diagnosis not present

## 2018-06-08 DIAGNOSIS — Z87891 Personal history of nicotine dependence: Secondary | ICD-10-CM | POA: Diagnosis not present

## 2018-06-08 DIAGNOSIS — Z09 Encounter for follow-up examination after completed treatment for conditions other than malignant neoplasm: Secondary | ICD-10-CM

## 2018-06-08 DIAGNOSIS — R200 Anesthesia of skin: Secondary | ICD-10-CM | POA: Diagnosis present

## 2018-06-08 HISTORY — PX: INSERTION OF DIALYSIS CATHETER: SHX1324

## 2018-06-08 HISTORY — PX: LIGATION OF ARTERIOVENOUS  FISTULA: SHX5948

## 2018-06-08 LAB — GLUCOSE, CAPILLARY
GLUCOSE-CAPILLARY: 223 mg/dL — AB (ref 70–99)
GLUCOSE-CAPILLARY: 188 mg/dL — AB (ref 70–99)
Glucose-Capillary: 228 mg/dL — ABNORMAL HIGH (ref 70–99)

## 2018-06-08 LAB — POCT I-STAT 4, (NA,K, GLUC, HGB,HCT)
GLUCOSE: 243 mg/dL — AB (ref 70–99)
HEMATOCRIT: 33 % — AB (ref 36.0–46.0)
Hemoglobin: 11.2 g/dL — ABNORMAL LOW (ref 12.0–15.0)
Potassium: 3.8 mmol/L (ref 3.5–5.1)
Sodium: 138 mmol/L (ref 135–145)

## 2018-06-08 SURGERY — LIGATION OF ARTERIOVENOUS  FISTULA
Anesthesia: General | Site: Neck | Laterality: Right

## 2018-06-08 MED ORDER — SODIUM CHLORIDE 0.9 % IV SOLN
INTRAVENOUS | Status: AC
  Filled 2018-06-08: qty 1.2

## 2018-06-08 MED ORDER — PROPOFOL 10 MG/ML IV BOLUS
INTRAVENOUS | Status: AC
  Filled 2018-06-08: qty 20

## 2018-06-08 MED ORDER — HEPARIN SODIUM (PORCINE) 1000 UNIT/ML IJ SOLN
INTRAMUSCULAR | Status: DC | PRN
  Administered 2018-06-08: 1000 [IU]

## 2018-06-08 MED ORDER — LIDOCAINE HCL (PF) 1 % IJ SOLN
INTRAMUSCULAR | Status: AC
  Filled 2018-06-08: qty 30

## 2018-06-08 MED ORDER — 0.9 % SODIUM CHLORIDE (POUR BTL) OPTIME
TOPICAL | Status: DC | PRN
  Administered 2018-06-08: 1000 mL

## 2018-06-08 MED ORDER — MIDAZOLAM HCL 5 MG/5ML IJ SOLN
INTRAMUSCULAR | Status: DC | PRN
  Administered 2018-06-08: 2 mg via INTRAVENOUS

## 2018-06-08 MED ORDER — INSULIN ASPART 100 UNIT/ML ~~LOC~~ SOLN
SUBCUTANEOUS | Status: AC
  Administered 2018-06-08: 4 [IU] via SUBCUTANEOUS
  Filled 2018-06-08: qty 1

## 2018-06-08 MED ORDER — SODIUM CHLORIDE 0.9 % IV SOLN
INTRAVENOUS | Status: DC | PRN
  Administered 2018-06-08: 500 mL

## 2018-06-08 MED ORDER — FENTANYL CITRATE (PF) 250 MCG/5ML IJ SOLN
INTRAMUSCULAR | Status: DC | PRN
  Administered 2018-06-08 (×3): 25 ug via INTRAVENOUS

## 2018-06-08 MED ORDER — HEPARIN SODIUM (PORCINE) 1000 UNIT/ML IJ SOLN
INTRAMUSCULAR | Status: AC
  Filled 2018-06-08: qty 1

## 2018-06-08 MED ORDER — INSULIN ASPART 100 UNIT/ML ~~LOC~~ SOLN
4.0000 [IU] | Freq: Once | SUBCUTANEOUS | Status: AC
  Administered 2018-06-08: 4 [IU] via SUBCUTANEOUS

## 2018-06-08 MED ORDER — ONDANSETRON HCL 4 MG/2ML IJ SOLN
INTRAMUSCULAR | Status: DC | PRN
  Administered 2018-06-08: 4 mg via INTRAVENOUS

## 2018-06-08 MED ORDER — HYDRALAZINE HCL 20 MG/ML IJ SOLN
INTRAMUSCULAR | Status: AC
  Filled 2018-06-08: qty 1

## 2018-06-08 MED ORDER — LIDOCAINE-EPINEPHRINE (PF) 1 %-1:200000 IJ SOLN
INTRAMUSCULAR | Status: AC
  Filled 2018-06-08: qty 30

## 2018-06-08 MED ORDER — HYDRALAZINE HCL 20 MG/ML IJ SOLN
10.0000 mg | Freq: Once | INTRAMUSCULAR | Status: AC
  Administered 2018-06-08: 10 mg via INTRAVENOUS

## 2018-06-08 MED ORDER — SODIUM CHLORIDE 0.9 % IV SOLN
INTRAVENOUS | Status: DC
  Administered 2018-06-08: 09:00:00 via INTRAVENOUS

## 2018-06-08 MED ORDER — OXYCODONE HCL 5 MG PO TABS: 5.0000 mg | ORAL_TABLET | ORAL | 0 refills | Status: DC | PRN

## 2018-06-08 MED ORDER — CHLORHEXIDINE GLUCONATE 4 % EX LIQD: 60.0000 mL | Freq: Once | CUTANEOUS | Status: DC

## 2018-06-08 MED ORDER — FENTANYL CITRATE (PF) 250 MCG/5ML IJ SOLN
INTRAMUSCULAR | Status: AC
  Filled 2018-06-08: qty 5

## 2018-06-08 MED ORDER — FENTANYL CITRATE (PF) 100 MCG/2ML IJ SOLN
INTRAMUSCULAR | Status: AC
  Filled 2018-06-08: qty 2

## 2018-06-08 MED ORDER — FENTANYL CITRATE (PF) 100 MCG/2ML IJ SOLN
25.0000 ug | INTRAMUSCULAR | Status: DC | PRN
  Administered 2018-06-08 (×2): 25 ug via INTRAVENOUS
  Administered 2018-06-08: 50 ug via INTRAVENOUS

## 2018-06-08 MED ORDER — LIDOCAINE HCL (CARDIAC) PF 100 MG/5ML IV SOSY
PREFILLED_SYRINGE | INTRAVENOUS | Status: DC | PRN
  Administered 2018-06-08: 40 mg via INTRAVENOUS
  Administered 2018-06-08: 60 mg via INTRAVENOUS

## 2018-06-08 MED ORDER — SODIUM CHLORIDE 0.9 % IV SOLN
INTRAVENOUS | Status: DC | PRN
  Administered 2018-06-08: 50 ug/min via INTRAVENOUS

## 2018-06-08 MED ORDER — PROPOFOL 10 MG/ML IV BOLUS
INTRAVENOUS | Status: DC | PRN
  Administered 2018-06-08: 200 mg via INTRAVENOUS

## 2018-06-08 MED ORDER — MIDAZOLAM HCL 2 MG/2ML IJ SOLN
INTRAMUSCULAR | Status: AC
  Filled 2018-06-08: qty 2

## 2018-06-08 SURGICAL SUPPLY — 57 items
BAG DECANTER FOR FLEXI CONT (MISCELLANEOUS) ×3 IMPLANT
BIOPATCH RED 1 DISK 7.0 (GAUZE/BANDAGES/DRESSINGS) ×3 IMPLANT
CANISTER SUCT 3000ML PPV (MISCELLANEOUS) ×3 IMPLANT
CANNULA VESSEL 3MM 2 BLNT TIP (CANNULA) ×3 IMPLANT
CATH PALINDROME RT-P 15FX19CM (CATHETERS) ×3 IMPLANT
CATH PALINDROME RT-P 15FX23CM (CATHETERS) IMPLANT
CATH PALINDROME RT-P 15FX28CM (CATHETERS) IMPLANT
CATH PALINDROME RT-P 15FX55CM (CATHETERS) IMPLANT
CHLORAPREP W/TINT 26ML (MISCELLANEOUS) ×3 IMPLANT
COVER MAYO STAND STRL (DRAPES) ×3 IMPLANT
COVER PROBE W GEL 5X96 (DRAPES) ×3 IMPLANT
COVER SURGICAL LIGHT HANDLE (MISCELLANEOUS) ×3 IMPLANT
COVER TABLE BACK 60X90 (DRAPES) ×3 IMPLANT
COVER WAND RF STERILE (DRAPES) ×3 IMPLANT
DERMABOND ADVANCED (GAUZE/BANDAGES/DRESSINGS) ×1
DERMABOND ADVANCED .7 DNX12 (GAUZE/BANDAGES/DRESSINGS) ×2 IMPLANT
DRAPE C-ARM 42X72 X-RAY (DRAPES) ×3 IMPLANT
DRAPE CHEST BREAST 15X10 FENES (DRAPES) ×3 IMPLANT
DRSG COVADERM 4X6 (GAUZE/BANDAGES/DRESSINGS) ×3 IMPLANT
ELECT REM PT RETURN 9FT ADLT (ELECTROSURGICAL) ×3
ELECTRODE REM PT RTRN 9FT ADLT (ELECTROSURGICAL) ×2 IMPLANT
GAUZE 4X4 16PLY RFD (DISPOSABLE) ×3 IMPLANT
GAUZE SPONGE 4X4 12PLY STRL LF (GAUZE/BANDAGES/DRESSINGS) ×3 IMPLANT
GLOVE BIO SURGEON STRL SZ 6.5 (GLOVE) ×3 IMPLANT
GLOVE BIO SURGEON STRL SZ7.5 (GLOVE) ×12 IMPLANT
GLOVE BIOGEL PI IND STRL 6.5 (GLOVE) ×8 IMPLANT
GLOVE BIOGEL PI IND STRL 7.0 (GLOVE) ×8 IMPLANT
GLOVE BIOGEL PI IND STRL 8 (GLOVE) ×8 IMPLANT
GLOVE BIOGEL PI INDICATOR 6.5 (GLOVE) ×4
GLOVE BIOGEL PI INDICATOR 7.0 (GLOVE) ×4
GLOVE BIOGEL PI INDICATOR 8 (GLOVE) ×4
GLOVE ECLIPSE 6.5 STRL STRAW (GLOVE) ×3 IMPLANT
GOWN STRL REUS W/ TWL LRG LVL3 (GOWN DISPOSABLE) ×10 IMPLANT
GOWN STRL REUS W/TWL LRG LVL3 (GOWN DISPOSABLE) ×5
KIT BASIN OR (CUSTOM PROCEDURE TRAY) ×3 IMPLANT
KIT TURNOVER KIT B (KITS) ×3 IMPLANT
NEEDLE 18GX1X1/2 (RX/OR ONLY) (NEEDLE) ×3 IMPLANT
NEEDLE HYPO 25GX1X1/2 BEV (NEEDLE) ×3 IMPLANT
NS IRRIG 1000ML POUR BTL (IV SOLUTION) ×3 IMPLANT
PACK CV ACCESS (CUSTOM PROCEDURE TRAY) ×3 IMPLANT
PACK SURGICAL SETUP 50X90 (CUSTOM PROCEDURE TRAY) ×3 IMPLANT
PAD ARMBOARD 7.5X6 YLW CONV (MISCELLANEOUS) ×6 IMPLANT
SPONGE SURGIFOAM ABS GEL 100 (HEMOSTASIS) IMPLANT
SUT ETHILON 3 0 PS 1 (SUTURE) ×3 IMPLANT
SUT PROLENE 6 0 BV (SUTURE) IMPLANT
SUT SILK 0 TIES 10X30 (SUTURE) ×3 IMPLANT
SUT VIC AB 3-0 SH 27 (SUTURE) ×1
SUT VIC AB 3-0 SH 27X BRD (SUTURE) ×2 IMPLANT
SUT VICRYL 4-0 PS2 18IN ABS (SUTURE) ×3 IMPLANT
SYR 10ML LL (SYRINGE) ×3 IMPLANT
SYR 20CC LL (SYRINGE) ×6 IMPLANT
SYR 5ML LL (SYRINGE) ×6 IMPLANT
SYR CONTROL 10ML LL (SYRINGE) ×3 IMPLANT
TOWEL GREEN STERILE (TOWEL DISPOSABLE) ×6 IMPLANT
TOWEL GREEN STERILE FF (TOWEL DISPOSABLE) ×3 IMPLANT
UNDERPAD 30X30 (UNDERPADS AND DIAPERS) ×3 IMPLANT
WATER STERILE IRR 1000ML POUR (IV SOLUTION) ×3 IMPLANT

## 2018-06-08 NOTE — Progress Notes (Addendum)
Dr. Suzette Battiest made aware of increased BP 190s/110s. Order for 10mg  of Hydralazine given and administered. Goal BP 170/100. Pt also have increased bloody drainage from left eye. Pt has clear drainage since removal of eye earlier this year. MD made aware and saw pt at bedside. No new orders given and Dr. Scot Dock made aware.

## 2018-06-08 NOTE — Anesthesia Postprocedure Evaluation (Signed)
Anesthesia Post Note  Patient: Selinda Michaels  Procedure(s) Performed: LIGATION OF ARTERIOVENOUS  FISTULA RIGHT ARM (Right Arm Upper) INSERTION OF Right internal jugular TUNNELED  DIALYSIS CATHETER (N/A Neck)     Patient location during evaluation: PACU Anesthesia Type: General Level of consciousness: awake and alert Pain management: pain level controlled Vital Signs Assessment: post-procedure vital signs reviewed and stable Respiratory status: spontaneous breathing, nonlabored ventilation, respiratory function stable and patient connected to nasal cannula oxygen Cardiovascular status: blood pressure returned to baseline and stable Postop Assessment: no apparent nausea or vomiting Anesthetic complications: no    Last Vitals:  Vitals:   06/08/18 1207 06/08/18 1222  BP: (!) 156/83 (!) 153/81  Pulse: (!) 59 60  Resp: 15 15  Temp: (!) 36.3 C   SpO2: 98% 97%    Last Pain:  Vitals:   06/08/18 1222  TempSrc:   PainSc: 4                  Tiajuana Amass

## 2018-06-08 NOTE — Transfer of Care (Signed)
Immediate Anesthesia Transfer of Care Note  Patient: Martha [name]  Procedure(s) Performed: LIGATION OF ARTERIOVENOUS  FISTULA RIGHT ARM (Right Arm Upper) INSERTION OF Right internal jugular TUNNELED  DIALYSIS CATHETER (N/A Neck)  Patient Location: PACU  Anesthesia Type:General  Level of Consciousness: awake, alert  and oriented  Airway & Oxygen Therapy: Patient Spontanous Breathing and Patient connected to nasal cannula oxygen  Post-op Assessment: Report given to RN, Post -op Vital signs reviewed and stable and Patient moving all extremities X 4  Post vital signs: Reviewed and stable  Last Vitals:  Vitals Value Taken Time  BP 157/89 06/08/2018 10:52 AM  Temp    Pulse 66 06/08/2018 10:53 AM  Resp 14 06/08/2018 10:53 AM  SpO2 96 % 06/08/2018 10:53 AM  Vitals shown include unvalidated device data.  Last Pain:  Vitals:   06/08/18 0837  TempSrc:   PainSc: 0-No pain      Patients Stated Pain Goal: 2 (07/21/82 4621)  Complications: No apparent anesthesia complications

## 2018-06-08 NOTE — Anesthesia Preprocedure Evaluation (Signed)
Anesthesia Evaluation  Patient identified by MRN, date of birth, ID band Patient awake    Reviewed: Allergy & Precautions, NPO status , Patient's Chart, lab work & pertinent test results  Airway Mallampati: II  TM Distance: >3 FB     Dental  (+) Dental Advisory Given   Pulmonary sleep apnea and Continuous Positive Airway Pressure Ventilation , COPD, former smoker,    breath sounds clear to auscultation       Cardiovascular hypertension, Pt. on medications  Rhythm:Regular Rate:Normal     Neuro/Psych PSYCHIATRIC DISORDERS Depression  Neuromuscular disease    GI/Hepatic Neg liver ROS, GERD  ,  Endo/Other  diabetes, Type 2, Insulin Dependent  Renal/GU ESRF and DialysisRenal disease     Musculoskeletal  (+) Arthritis ,   Abdominal   Peds  Hematology  (+) anemia ,   Anesthesia Other Findings   Reproductive/Obstetrics                             Lab Results  Component Value Date   WBC 9.3 05/05/2018   HGB 11.2 (L) 06/08/2018   HCT 33.0 (L) 06/08/2018   MCV 91.3 05/05/2018   PLT 151 05/05/2018   Lab Results  Component Value Date   CREATININE 3.65 (H) 05/05/2018   BUN 22 (H) 05/05/2018   NA 138 06/08/2018   K 3.8 06/08/2018   CL 101 05/05/2018   CO2 23 05/05/2018    Anesthesia Physical Anesthesia Plan  ASA: IV  Anesthesia Plan: General   Post-op Pain Management:    Induction: Intravenous  PONV Risk Score and Plan: 3 and Ondansetron, Dexamethasone and Treatment may vary due to age or medical condition  Airway Management Planned: LMA  Additional Equipment:   Intra-op Plan:   Post-operative Plan: Extubation in OR  Informed Consent: I have reviewed the patients History and Physical, chart, labs and discussed the procedure including the risks, benefits and alternatives for the proposed anesthesia with the patient or authorized representative who has indicated his/her  understanding and acceptance.   Dental advisory given  Plan Discussed with: CRNA  Anesthesia Plan Comments:         Anesthesia Quick Evaluation

## 2018-06-08 NOTE — Interval H&P Note (Signed)
History and Physical Interval Note:  06/08/2018 9:07 AM  Martha [name]  has presented today for surgery, with the diagnosis of COMPLICATION WITH ARTERIOVENOUS FISTULA RIGHT ARM/FOR HEMODIALYSIS ACCESS  The various methods of treatment have been discussed with the patient and family. After consideration of risks, benefits and other options for treatment, the patient has consented to  Procedure(s): LIGATION OF ARTERIOVENOUS  FISTULA RIGHT ARM (Right) INSERTION OF TUNNELED  DIALYSIS CATHETER (N/A) as a surgical intervention .  The patient's history has been reviewed, patient examined, no change in status, stable for surgery.  I have reviewed the patient's chart and labs.  Questions were answered to the patient's satisfaction.     [name]

## 2018-06-08 NOTE — Op Note (Signed)
    NAME: Martha [name]    MRN: [id number] DOB: 1966-04-08    DATE OF OPERATION: 06/08/2018  PREOP DIAGNOSIS:    Steal syndrome right upper extremity, end-stage renal disease  POSTOP DIAGNOSIS:    Same  PROCEDURE:    1.  Ultrasound-guided placement of right IJ 19 cm tunneled dialysis catheter 2.  Ligation of right upper arm fistula  SURGEON: Judeth Cornfield. Scot Dock, MD, FACS  ASSIST: Roselyn Reef RNFA  ANESTHESIA: General  EBL: Minimal  INDICATIONS:    Martha [name] is a 52 y.o. female who presented with steal symptoms of her right upper extremity.  She was set up for ligation of her fistula and placement of a catheter.  FINDINGS:   Palpable pulse on the right radial artery at the completion of the procedure.  TECHNIQUE:   The patient was taken to the operating room and received a general anesthetic.  The neck was prepped and draped in usual sterile fashion.  Under ultrasound guidance, after the skin was anesthetized, the right IJ was cannulated and a guidewire introduced into the superior vena cava under fluoroscopic control.  The tract over the wire was dilated and then a dilator and peel-away sheath were advanced over the wire and the wire and dilator removed.  The 19 cm catheter was passed through the peel-away sheath and positioned in the right atrium.  The exit site for the catheter was selected and the catheter was brought through the tunnel.  The catheter was cut the appropriate length and the distal ports were attached.  Both ports withdrew easily with and flushed with heparinized saline and filled with concentrated heparin.  The catheter was secured at its exit site with a 3-0 nylon suture.  The IJ cannulation site was closed with a 4-0 sub-cuticular stitch.  Sterile dressing was applied.  Attention was then turned to the right arm.  The right arm was prepped and draped in usual sterile fashion.  An incision was made just above the antecubital level where the fistula here was  dissected free.  It was ligated with two 2-0 silk ties.  At the completion there was a palpable radial pulse and a biphasic signal with the Doppler.  The wound was closed with a deep layer of 3-0 Vicryl the skin closed with 4-0 Vicryl.  Dermabond was applied.  The patient tolerated procedure well was transferred to the recovery room in stable condition.  All needle and sponge counts were correct.  [name], MD, FACS [facility]  DATE OF DICTATION:   06/08/2018

## 2018-06-08 NOTE — Anesthesia Procedure Notes (Signed)
Procedure Name: LMA Insertion Date/Time: 06/08/2018 9:41 AM Performed by: Mariea Clonts, CRNA Pre-anesthesia Checklist: Patient identified, Emergency Drugs available, Suction available and Patient being monitored Patient Re-evaluated:Patient Re-evaluated prior to induction Oxygen Delivery Method: Circle System Utilized Preoxygenation: Pre-oxygenation with 100% oxygen Induction Type: IV induction Ventilation: Mask ventilation without difficulty LMA: LMA inserted LMA Size: 4.0 Number of attempts: 1 Airway Equipment and Method: Bite block Placement Confirmation: positive ETCO2 Tube secured with: Tape Dental Injury: Teeth and Oropharynx as per pre-operative assessment

## 2018-06-09 ENCOUNTER — Encounter (HOSPITAL_COMMUNITY): Payer: Self-pay | Admitting: Vascular Surgery

## 2018-06-10 ENCOUNTER — Telehealth: Payer: Self-pay | Admitting: Vascular Surgery

## 2018-06-10 ENCOUNTER — Other Ambulatory Visit: Payer: Self-pay

## 2018-06-10 DIAGNOSIS — N186 End stage renal disease: Secondary | ICD-10-CM

## 2018-06-10 DIAGNOSIS — T82510A Breakdown (mechanical) of surgically created arteriovenous fistula, initial encounter: Secondary | ICD-10-CM

## 2018-06-10 DIAGNOSIS — Z992 Dependence on renal dialysis: Principal | ICD-10-CM

## 2018-06-10 NOTE — Telephone Encounter (Signed)
sch appt spk to pt 06/23/18 3pm UE mapping 345pm p/o MD

## 2018-06-10 NOTE — Telephone Encounter (Signed)
-----   Message from Mena Goes, RN sent at 06/08/2018 11:03 AM EST ----- Regarding: DR FIELDS in 2-3 weeks to discuss surgery and vein mapping   ----- Message ----- From: Angelia Mould, MD Sent: 06/08/2018  10:52 AM EST To: [facility] Charge Pool Subject: charge                                          PROCEDURE:   1.  Ultrasound-guided placement of right IJ 19 cm tunneled dialysis catheter 2.  Ligation of right upper arm fistula  SURGEON: Judeth Cornfield. Scot Dock, MD, FACS  ASSIST: Roselyn Reef RNFA  This patient dialyzes on Monday Wednesdays and Fridays.  As per Dr. Ruta Hinds note, she will need to be seen back in 2 to 3 weeks with a vein map of her left upper extremity in order to evaluate her for new access in the left arm.  Thank you. CD

## 2018-06-22 ENCOUNTER — Encounter (HOSPITAL_COMMUNITY): Payer: Self-pay | Admitting: Emergency Medicine

## 2018-06-22 ENCOUNTER — Other Ambulatory Visit: Payer: Self-pay

## 2018-06-22 ENCOUNTER — Observation Stay (HOSPITAL_COMMUNITY)
Admission: EM | Admit: 2018-06-22 | Discharge: 2018-06-23 | Disposition: A | Discharge status: Home / Self Care | Payer: Medicaid Other | Attending: Pulmonary Disease | Admitting: Pulmonary Disease

## 2018-06-22 DIAGNOSIS — I509 Heart failure, unspecified: Secondary | ICD-10-CM | POA: Insufficient documentation

## 2018-06-22 DIAGNOSIS — Z794 Long term (current) use of insulin: Secondary | ICD-10-CM | POA: Diagnosis not present

## 2018-06-22 DIAGNOSIS — E860 Dehydration: Secondary | ICD-10-CM

## 2018-06-22 DIAGNOSIS — F329 Major depressive disorder, single episode, unspecified: Secondary | ICD-10-CM | POA: Diagnosis not present

## 2018-06-22 DIAGNOSIS — E119 Type 2 diabetes mellitus without complications: Secondary | ICD-10-CM | POA: Diagnosis present

## 2018-06-22 DIAGNOSIS — L97529 Non-pressure chronic ulcer of other part of left foot with unspecified severity: Secondary | ICD-10-CM

## 2018-06-22 DIAGNOSIS — Z992 Dependence on renal dialysis: Secondary | ICD-10-CM | POA: Diagnosis not present

## 2018-06-22 DIAGNOSIS — Z87891 Personal history of nicotine dependence: Secondary | ICD-10-CM | POA: Diagnosis not present

## 2018-06-22 DIAGNOSIS — J449 Chronic obstructive pulmonary disease, unspecified: Secondary | ICD-10-CM | POA: Insufficient documentation

## 2018-06-22 DIAGNOSIS — E1165 Type 2 diabetes mellitus with hyperglycemia: Secondary | ICD-10-CM | POA: Insufficient documentation

## 2018-06-22 DIAGNOSIS — E11621 Type 2 diabetes mellitus with foot ulcer: Secondary | ICD-10-CM

## 2018-06-22 DIAGNOSIS — R6510 Systemic inflammatory response syndrome (SIRS) of non-infectious origin without acute organ dysfunction: Secondary | ICD-10-CM | POA: Insufficient documentation

## 2018-06-22 DIAGNOSIS — N186 End stage renal disease: Secondary | ICD-10-CM | POA: Diagnosis not present

## 2018-06-22 DIAGNOSIS — Z79899 Other long term (current) drug therapy: Secondary | ICD-10-CM | POA: Diagnosis not present

## 2018-06-22 DIAGNOSIS — I132 Hypertensive heart and chronic kidney disease with heart failure and with stage 5 chronic kidney disease, or end stage renal disease: Secondary | ICD-10-CM | POA: Diagnosis not present

## 2018-06-22 DIAGNOSIS — L97509 Non-pressure chronic ulcer of other part of unspecified foot with unspecified severity: Secondary | ICD-10-CM

## 2018-06-22 DIAGNOSIS — F418 Other specified anxiety disorders: Secondary | ICD-10-CM | POA: Diagnosis present

## 2018-06-22 DIAGNOSIS — Z9001 Acquired absence of eye: Secondary | ICD-10-CM | POA: Insufficient documentation

## 2018-06-22 DIAGNOSIS — R112 Nausea with vomiting, unspecified: Secondary | ICD-10-CM

## 2018-06-22 DIAGNOSIS — R509 Fever, unspecified: Secondary | ICD-10-CM

## 2018-06-22 DIAGNOSIS — R1110 Vomiting, unspecified: Secondary | ICD-10-CM | POA: Diagnosis present

## 2018-06-22 DIAGNOSIS — F419 Anxiety disorder, unspecified: Secondary | ICD-10-CM | POA: Insufficient documentation

## 2018-06-22 LAB — CBC WITH DIFFERENTIAL/PLATELET
Abs Immature Granulocytes: 0.06 10*3/uL (ref 0.00–0.07)
BASOS PCT: 0 %
Basophils Absolute: 0 10*3/uL (ref 0.0–0.1)
EOS ABS: 0.1 10*3/uL (ref 0.0–0.5)
Eosinophils Relative: 1 %
HCT: 35.4 % — ABNORMAL LOW (ref 36.0–46.0)
Hemoglobin: 11.2 g/dL — ABNORMAL LOW (ref 12.0–15.0)
IMMATURE GRANULOCYTES: 1 %
Lymphocytes Relative: 8 %
Lymphs Abs: 0.7 10*3/uL (ref 0.7–4.0)
MCH: 28.4 pg (ref 26.0–34.0)
MCHC: 31.6 g/dL (ref 30.0–36.0)
MCV: 89.8 fL (ref 80.0–100.0)
MONO ABS: 0.5 10*3/uL (ref 0.1–1.0)
MONOS PCT: 5 %
NEUTROS ABS: 8.1 10*3/uL — AB (ref 1.7–7.7)
NEUTROS PCT: 85 %
PLATELETS: 150 10*3/uL (ref 150–400)
RBC: 3.94 MIL/uL (ref 3.87–5.11)
RDW: 15.2 % (ref 11.5–15.5)
WBC: 9.5 10*3/uL (ref 4.0–10.5)
nRBC: 0 % (ref 0.0–0.2)

## 2018-06-22 LAB — COMPREHENSIVE METABOLIC PANEL
ALT: 12 U/L (ref 0–44)
AST: 14 U/L — ABNORMAL LOW (ref 15–41)
Albumin: 3.9 g/dL (ref 3.5–5.0)
Alkaline Phosphatase: 80 U/L (ref 38–126)
Anion gap: 14 (ref 5–15)
BUN: 48 mg/dL — ABNORMAL HIGH (ref 6–20)
CHLORIDE: 102 mmol/L (ref 98–111)
CO2: 24 mmol/L (ref 22–32)
Calcium: 9.8 mg/dL (ref 8.9–10.3)
Creatinine, Ser: 5.53 mg/dL — ABNORMAL HIGH (ref 0.44–1.00)
GFR, EST AFRICAN AMERICAN: 10 mL/min — AB (ref >60)
GFR, EST NON AFRICAN AMERICAN: 8 mL/min — AB (ref >60)
Glucose, Bld: 298 mg/dL — ABNORMAL HIGH (ref 70–99)
POTASSIUM: 3.8 mmol/L (ref 3.5–5.1)
SODIUM: 140 mmol/L (ref 135–145)
Total Bilirubin: 0.6 mg/dL (ref 0.3–1.2)
Total Protein: 7.8 g/dL (ref 6.5–8.1)

## 2018-06-22 MED ORDER — ONDANSETRON HCL 4 MG/2ML IJ SOLN
4.0000 mg | Freq: Once | INTRAMUSCULAR | Status: AC
  Administered 2018-06-22: 4 mg via INTRAVENOUS
  Filled 2018-06-22: qty 2

## 2018-06-22 MED ORDER — SODIUM CHLORIDE 0.9 % IV BOLUS
500.0000 mL | Freq: Once | INTRAVENOUS | Status: AC
  Administered 2018-06-22: 500 mL via INTRAVENOUS

## 2018-06-22 MED ORDER — ACETAMINOPHEN 500 MG PO TABS
1000.0000 mg | ORAL_TABLET | Freq: Once | ORAL | Status: AC
  Administered 2018-06-22: 1000 mg via ORAL
  Filled 2018-06-22: qty 2

## 2018-06-22 NOTE — ED Triage Notes (Signed)
Pt c/o increased urination and vomiting that started today, pt c/o recurrent UTIs and usually has vomiting with UTIs

## 2018-06-22 NOTE — ED Provider Notes (Signed)
[facility] EMERGENCY DEPARTMENT Provider Note   CSN: [id number] Arrival date & time: 06/22/18  2108     History   Chief Complaint Chief Complaint  Patient presents with  . Urinary Tract Infection  . Emesis    HPI Martha [name] is a 52 y.o. female.  HPI Patient with history of diabetes and end-stage renal disease on hemodialysis Monday, Wednesday and Friday presents with urinary frequency, multiple episodes of vomiting and chills.  States these symptoms are similar to when she previously had urinary tract infections.  Complains of diffuse myalgias.  Initially had a headache but this has resolved.  No diarrhea.  No blood in the vomit. Past Medical History:  Diagnosis Date  . Acid reflux    takes Tums  . Anemia   . Arthritis   . Bipolar 1 disorder ([place])   . Blindness of left eye   . Cervical radiculopathy   . CHF (congestive heart failure) ([place])   . Chronic kidney disease    Stage 5- 01/25/17  . COPD (chronic obstructive pulmonary disease) ([place]) 2014   bronchitis  . Degenerative disc disease, thoracic   . Depression   . Diabetes mellitus    Type II  . Diabetic retinopathy ([place])   . End stage kidney disease ([place])    M, W, F [facility]  . Hypertension   . Noncompliance with medication regimen   . Noncompliance with medication regimen   . Obesity (BMI 30-39.9)   . OSA (obstructive sleep apnea)    cpap  . Panic attack   . RLS (restless legs syndrome)     Patient Active Problem List   Diagnosis Date Noted  . SIRS (systemic inflammatory response syndrome) ([place]) 06/23/2018  . UTI (urinary tract infection) 02/26/2018  . Volume overload 02/25/2018  . 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 Dialysis patient ([place]) 12/13/2017  . ESRD (end stage renal disease) on dialysis ([place])   . Chronic diarrhea 10/08/2017  . Renal failure (ARF), acute on chronic (HCC) 04/22/2017  . Anasarca associated with disorder of kidney 04/20/2017  . Protein-calorie  malnutrition, severe 11/29/2016  . HTN (hypertension), malignant 11/20/2016  . Bipolar 1 disorder ([place]) 11/20/2016  . Noncompliance with medication regimen 11/20/2016  . Panic attack 11/20/2016  . Uncontrolled type 2 diabetes mellitus with hyperglycemia, without long-term current use of insulin ([place]) 10/27/2016  . Sensory disturbance 10/27/2016  . Left hemiparesis ([place]) 10/27/2016  . Cholelithiasis 04/26/2014  . Hyponatremia 04/26/2014  . Gallstones 04/06/2014  . Diabetic ulcer of left great toe ([place]) 10/09/2013  . Diabetes mellitus, type 2 ([place]) 05/30/2013  . Hyperglycemia 05/30/2013  . Internal hemorrhoids with other complication [id number]  . Umbilical hernia without mention of obstruction or gangrene 04/15/2013  . Anemia 04/15/2013  . DM 05/13/2010  . Hyperlipidemia 05/13/2010  . Morbid obesity ([place]) 05/13/2010  . Depression with anxiety 05/13/2010  . RESTLESS LEG SYNDROME 05/13/2010  . Essential hypertension 05/13/2010  . GERD 05/13/2010  . RECTAL BLEEDING 05/13/2010  . Sleep apnea 05/13/2010  . Dysphagia, oropharyngeal phase 05/13/2010    Past Surgical History:  Procedure Laterality Date  . AV FISTULA PLACEMENT Right 01/27/2017   Procedure: ARTERIOVENOUS (AV) FISTULA CREATION-RIGHT ARM;  Surgeon: Elam Dutch, MD;  Location: [place];  Service: Vascular;  Laterality: Right;  . BIOPSY N/A 05/17/2013   Procedure: BIOPSY;  Surgeon: [name], MD;  Location: [facility];  Service: Endoscopy;  Laterality: N/A;  . CHOLECYSTECTOMY N/A 04/27/2014  Procedure: LAPAROSCOPIC CHOLECYSTECTOMY;  Surgeon: [name], MD;  Location: [facility];  Service: General;  Laterality: N/A;  . COLONOSCOPY  06/06/2010   VQQ:VZDGLO bleeding secondary to internal hemorrhoids but incomplete evaluation secondary to poor right colon prep/small rectal and sigmoid colon polyps (hyperplastic). PROPOFOL  . COLONOSCOPY  May 2013   Dr. Gilliam/[facility]: 5 mm a descending colon polyp, hyperplastic. Adequate  bowel prep.  . ENDOMETRIAL ABLATION    . 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 ENUCLEATION Left 02/04/2018   Procedure: ENUCLEATION WITH PLACEMENT OF IMPLANT LEFT EYE;  Surgeon: Clista Bernhardt, MD;  Location: [place];  Service: Ophthalmology;  Laterality: Left;  . ESOPHAGOGASTRODUODENOSCOPY  06/06/2010   VFI:EPPIRJJ erythema and edema of body of stomach, with sessile polypoid lesions. bx benign. no h.pylori  . ESOPHAGOGASTRODUODENOSCOPY (EGD) WITH PROPOFOL N/A 05/17/2013   Dr. Oneida Alar: normal esophagus, moderate nodular gastritis, negative path, empiric Savary dilation  . EYE SURGERY  07/2017   sx for glaucoma  . FLEXIBLE SIGMOIDOSCOPY N/A 05/17/2013   Dr. Oneida Alar: moderate sized internal hemorrhoids  . HEMORRHOID BANDING N/A 05/17/2013   Procedure: HEMORRHOID BANDING;  Surgeon: [name], MD;  Location: [facility];  Service: Endoscopy;  Laterality: N/A;  2 bands placed  . INCISION AND DRAINAGE ABSCESS Left 10/11/2013   Procedure: INCISION AND DRAINAGE AND DEBRIDEMENT LEFT BREAST  ABSCESS;  Surgeon: [name], MD;  Location: [facility];  Service: General;  Laterality: Left;  . INSERTION OF DIALYSIS CATHETER N/A 06/08/2018   Procedure: INSERTION OF Right internal jugular TUNNELED  DIALYSIS CATHETER;  Surgeon: Angelia Mould, MD;  Location: [place];  Service: Vascular;  Laterality: N/A;  . IRRIGATION AND DEBRIDEMENT ABSCESS Right 06/01/2013   Procedure: INCISION AND DRAINAGE AND DEBRIDEMENT ABSCESS RIGHT BREAST;  Surgeon: [name], MD;  Location: [facility];  Service: General;  Laterality: Right;  . LEFT EYE REMOVED Left 01/2018   [facility] [address].  Marland Kitchen LIGATION OF ARTERIOVENOUS  FISTULA Right 06/08/2018   Procedure: LIGATION OF ARTERIOVENOUS  FISTULA RIGHT ARM;  Surgeon: Angelia Mould, MD;  Location: [place];  Service: Vascular;  Laterality: Right;  . SAVORY DILATION N/A 05/17/2013   Procedure: SAVORY  DILATION;  Surgeon: [name], MD;  Location: [facility];  Service: Endoscopy;  Laterality: N/A;  14/15/16  . SKIN FULL THICKNESS GRAFT Left 05/05/2018   Procedure: ABDOMINAL DERMIS FAT SKIN GRAFT FULL THICKNESS LEFT EYE;  Surgeon: Clista Bernhardt, MD;  Location: [place];  Service: Ophthalmology;  Laterality: Left;  . TUBAL LIGATION       OB History    Gravida  2   Para  2   Term  1   Preterm  1   AB      Living  2     SAB      TAB      Ectopic      Multiple      Live Births               Home Medications    Prior to Admission medications   Medication Sig Start Date End Date Taking? Authorizing Provider  albuterol (PROVENTIL) (2.5 MG/3ML) 0.083% nebulizer solution Take 2.5 mg by nebulization 4 (four) times daily.   Yes [provider]  ALPRAZolam Duanne Moron) 1 MG tablet Take 1 mg by mouth 4 (four) times daily.    Yes [provider]  atorvastatin (LIPITOR) 20  MG tablet Take 20 mg by mouth daily.    Yes [provider]  busPIRone (BUSPAR) 7.5 MG tablet Take 7.5 mg by mouth 2 (two) times daily.   Yes [provider]  calcium carbonate (TUMS EX) 750 MG chewable tablet Chew 1-3 tablets by mouth See admin instructions. Patient takes 3 tablets with meals and 1 tablet with snacks three times a day   Yes [provider]  colchicine 0.6 MG tablet Take 1 tablet (0.6 mg total) by mouth every other day. Patient taking differently: Take 0.6 mg by mouth every other day. *TAKE ON NON-DIALYSIS DAYS ONLY 11/19/16  Yes Sinda Du, MD  dicyclomine (BENTYL) 10 MG capsule Take 1 capsule (10 mg total) by mouth 4 (four) times daily -  before meals and at bedtime. Patient taking differently: Take 10 mg by mouth daily.  10/08/17  Yes Annitta Needs, NP  gabapentin (NEURONTIN) 300 MG capsule Take 300 mg by mouth at bedtime.    Yes [provider]  HYDROcodone-acetaminophen (NORCO) 10-325 MG tablet Take 1 tablet by mouth every 6 (six) hours  as needed for moderate pain. Patient taking differently: Take 1 tablet by mouth 4 (four) times daily.  01/27/17  Yes Rhyne, Samantha J, PA-C  Insulin Glargine (LANTUS SOLOSTAR) 100 UNIT/ML Solostar Pen Inject 15 Units into the skin at bedtime. Patient taking differently: Inject 10 Units into the skin at bedtime. PATIENT TAKES BASED ON SLIDING SCALE 02/27/18  Yes Kathie Dike, MD  lidocaine-prilocaine (EMLA) cream Apply 1 application topically as needed (topical anesthesia for hemodialysis if Gebauers and Lidocaine injection are ineffective.). 04/25/17  Yes Johnson, Clanford L, MD  losartan (COZAAR) 25 MG tablet Take 25 mg by mouth daily.   Yes [provider]  PARoxetine (PAXIL) 20 MG tablet Take 1 daily. This is to prevent panic attacks Patient taking differently: Take 20-40 mg by mouth daily. This is to prevent panic attacks 10/28/16  Yes Sinda Du, MD  torsemide (DEMADEX) 100 MG tablet Take 100 mg by mouth See admin instructions. TAKE ONE TABLET BY MOUTH ON NON DIALYSIS DAYS ONLY-   Yes [provider]  cefUROXime (CEFTIN) 250 MG tablet Take 2 tablets after each dialysis x5 occasions 06/23/18   Sinda Du, MD    Family History Family History  Adopted: Yes  Family history unknown: Yes    Social History Social History   Tobacco Use  . Smoking status: Former Smoker    Packs/day: 1.50    Years: 25.00    Pack years: 37.50    Types: Cigarettes    Last attempt to quit: 10/23/2007    Years since quitting: 10.6  . Smokeless tobacco: Never Used  Substance Use Topics  . Alcohol use: No  . Drug use: No     Allergies   Tape and Codeine   Review of Systems Review of Systems  Constitutional: Positive for chills and fatigue. Negative for fever.  HENT: Negative for sinus pressure, sore throat and trouble swallowing.   Eyes: Negative for photophobia and visual disturbance.  Respiratory: Negative for cough and shortness of breath.   Cardiovascular: Negative for  chest pain, palpitations and leg swelling.  Gastrointestinal: Positive for nausea and vomiting. Negative for abdominal pain, constipation and diarrhea.  Genitourinary: Positive for flank pain and frequency. Negative for dysuria and pelvic pain.  Musculoskeletal: Positive for back pain and myalgias. Negative for neck pain and neck stiffness.  Skin: Negative for rash and wound.  Neurological: Positive for headaches. Negative  for dizziness, weakness, light-headedness and numbness.  All other systems reviewed and are negative.    Physical Exam Updated Vital Signs BP 125/74   Pulse 62   Temp 99 F (37.2 C) (Oral)   Resp 16   Ht 5\' 7"  (1.702 m)   Wt (!) 154.9 kg   SpO2 97%   BMI 53.49 kg/m   Physical Exam  Constitutional: She is oriented to person, place, and time. She appears well-developed and well-nourished.  HENT:  Head: Normocephalic and atraumatic.  Dry mucous membranes  Eyes: Pupils are equal, round, and reactive to light. EOM are normal.  Left globe is removed.   Neck: Normal range of motion. Neck supple. No JVD present.  Cardiovascular: Normal rate and regular rhythm. Exam reveals no gallop and no friction rub.  No murmur heard. Pulmonary/Chest: Effort normal and breath sounds normal. No stridor. No respiratory distress. She has no wheezes. She has no rales. She exhibits no tenderness.  Right chest wall dialysis catheter in place.  No obvious erythema, warmth or tenderness.  Abdominal: Soft. Bowel sounds are normal. There is no tenderness. There is no rebound and no guarding.  Musculoskeletal: Normal range of motion. She exhibits no edema or tenderness.  Very minimal bilateral CVA tenderness.  No midline thoracic or lumbar tenderness.  Distal pulses intact.  Lymphadenopathy:    She has no cervical adenopathy.  Neurological: She is alert and oriented to person, place, and time.  Skin: Skin is warm and dry. Capillary refill takes less than 2 seconds. No rash noted. No  erythema.  Psychiatric: She has a normal mood and affect. Her behavior is normal.  Nursing note and vitals reviewed.    ED Treatments / Results  Labs (all labs ordered are listed, but only abnormal results are displayed) Labs Reviewed  CBC WITH DIFFERENTIAL/PLATELET - Abnormal; Notable for the following components:      Result Value   Hemoglobin 11.2 (*)    HCT 35.4 (*)    Neutro Abs 8.1 (*)    All other components within normal limits  COMPREHENSIVE METABOLIC PANEL - Abnormal; Notable for the following components:   Glucose, Bld 298 (*)    BUN 48 (*)    Creatinine, Ser 5.53 (*)    AST 14 (*)    GFR calc non Af Amer 8 (*)    GFR calc Af Amer 10 (*)    All other components within normal limits  URINALYSIS, ROUTINE W REFLEX MICROSCOPIC - Abnormal; Notable for the following components:   APPearance HAZY (*)    Glucose, UA >=500 (*)    Protein, ur >=300 (*)    Bacteria, UA RARE (*)    All other components within normal limits  BASIC METABOLIC PANEL - Abnormal; Notable for the following components:   Glucose, Bld 281 (*)    BUN 45 (*)    Creatinine, Ser 5.69 (*)    GFR calc non Af Amer 8 (*)    GFR calc Af Amer 9 (*)    All other components within normal limits  C-REACTIVE PROTEIN - Abnormal; Notable for the following components:   CRP 2.3 (*)    All other components within normal limits  SEDIMENTATION RATE - Abnormal; Notable for the following components:   Sed Rate 28 (*)    All other components within normal limits  GLUCOSE, CAPILLARY - Abnormal; Notable for the following components:   Glucose-Capillary 268 (*)    All other components within normal limits  GLUCOSE,  CAPILLARY - Abnormal; Notable for the following components:   Glucose-Capillary 250 (*)    All other components within normal limits  GLUCOSE, CAPILLARY - Abnormal; Notable for the following components:   Glucose-Capillary 146 (*)    All other components within normal limits  CBG MONITORING, ED - Abnormal;  Notable for the following components:   Glucose-Capillary 253 (*)    All other components within normal limits  CULTURE, BLOOD (ROUTINE X 2)  CULTURE, BLOOD (ROUTINE X 2)  MRSA PCR SCREENING  URINE CULTURE  INFLUENZA PANEL BY PCR (TYPE A & B)  I-STAT CG4 LACTIC ACID, ED    EKG None  Radiology Dg Chest 2 View  Result Date: 06/23/2018 CLINICAL DATA:  Fever EXAM: CHEST - 2 VIEW COMPARISON:  06/08/2018 FINDINGS: Right IJ dual lumen catheter is in unchanged position with tip at the proximal right atrium. Lungs are clear. Cardiomediastinal contours unchanged. IMPRESSION: No active cardiopulmonary disease. Electronically Signed   By: Ulyses Jarred M.D.   On: 06/23/2018 02:09   Dg Foot 2 Views Left  Result Date: 06/23/2018 CLINICAL DATA:  Fever with left great toe ulcer. EXAM: LEFT FOOT - 2 VIEW COMPARISON:  None. FINDINGS: There is an ulcer at the plantar surface of the left great toe. No visible extension to the osseous surface. There is no osteolysis or other acute osseous abnormality. There are posterior and plantar calcaneal enthesophytes. IMPRESSION: Plantar left great toe ulcer without evidence of osteomyelitis. Electronically Signed   By: Ulyses Jarred M.D.   On: 06/23/2018 03:15    Procedures Procedures (including critical care time)  Medications Ordered in ED Medications  colchicine tablet 0.6 mg (has no administration in time range)  HYDROcodone-acetaminophen (NORCO) 10-325 MG per tablet 1 tablet (1 tablet Oral Given 06/23/18 1004)  atorvastatin (LIPITOR) tablet 20 mg (20 mg Oral Given 06/23/18 1820)  ALPRAZolam (XANAX) tablet 1 mg (1 mg Oral Given 06/23/18 1820)  busPIRone (BUSPAR) tablet 7.5 mg (7.5 mg Oral Given 06/23/18 1005)  PARoxetine (PAXIL) tablet 20 mg (20 mg Oral Given 06/23/18 1004)  calcium carbonate (TUMS - dosed in mg elemental calcium) chewable tablet 400 mg of elemental calcium (400 mg of elemental calcium Oral Given 06/23/18 1820)  dicyclomine (BENTYL)  capsule 10 mg (10 mg Oral Given 06/23/18 1005)  gabapentin (NEURONTIN) capsule 300 mg (300 mg Oral Given 06/23/18 1006)  heparin injection 5,000 Units (5,000 Units Subcutaneous Given 06/23/18 1418)  sodium chloride flush (NS) 0.9 % injection 3 mL (3 mLs Intravenous Given 06/23/18 1415)  sodium chloride flush (NS) 0.9 % injection 3 mL (has no administration in time range)  0.9 %  sodium chloride infusion (has no administration in time range)  acetaminophen (TYLENOL) tablet 650 mg (has no administration in time range)    Or  acetaminophen (TYLENOL) suppository 650 mg (has no administration in time range)  HYDROmorphone (DILAUDID) injection 1 mg (1 mg Intravenous Given 06/23/18 1413)  ondansetron (ZOFRAN) tablet 4 mg (has no administration in time range)    Or  ondansetron (ZOFRAN) injection 4 mg (has no administration in time range)  insulin glargine (LANTUS) injection 7 Units (has no administration in time range)  insulin aspart (novoLOG) injection 0-9 Units (1 Units Subcutaneous Given 06/23/18 1821)  insulin aspart (novoLOG) injection 0-5 Units (3 Units Subcutaneous Given 06/23/18 0229)  chlorhexidine (PERIDEX) 0.12 % solution 15 mL (15 mLs Mouth Rinse Given 06/23/18 1006)  MEDLINE mouth rinse (15 mLs Mouth Rinse Given 06/23/18 1345)  Chlorhexidine Gluconate Cloth 2 %  PADS 6 each (has no administration in time range)  0.9 %  sodium chloride infusion (has no administration in time range)  0.9 %  sodium chloride infusion (has no administration in time range)  alteplase (CATHFLO ACTIVASE) injection 2 mg (has no administration in time range)  heparin injection 3,100 Units (has no administration in time range)  heparin injection 3,000 Units (has no administration in time range)  epoetin alfa (EPOGEN,PROCRIT) injection 2,000 Units (2,000 Units Intravenous Given 06/23/18 1431)  sodium chloride 0.9 % bolus 500 mL (0 mLs Intravenous Stopped 06/22/18 2341)  ondansetron (ZOFRAN) injection 4 mg (4 mg  Intravenous Given 06/22/18 2227)  acetaminophen (TYLENOL) tablet 1,000 mg (1,000 mg Oral Given 06/22/18 2320)  cefTRIAXone (ROCEPHIN) 1 g in sodium chloride 0.9 % 100 mL IVPB (0 g Intravenous Stopped 06/23/18 0219)     Initial Impression / Assessment and Plan / ED Course  I have reviewed the triage vital signs and the nursing notes.  Pertinent labs & imaging results that were available during my care of the patient were reviewed by me and considered in my medical decision making (see chart for details).    Signed out to oncoming emergency provider pending UA.   Final Clinical Impressions(s) / ED Diagnoses   Final diagnoses:  Non-intractable vomiting with nausea, unspecified vomiting type  Dehydration  Fever, unspecified fever cause  ESRD (end stage renal disease) [facility])    ED Discharge Orders         Ordered    cefUROXime (CEFTIN) 250 MG tablet     06/23/18 0911           Julianne Rice, MD 06/23/18 [phone]

## 2018-06-23 ENCOUNTER — Observation Stay (HOSPITAL_COMMUNITY): Payer: Medicaid Other

## 2018-06-23 ENCOUNTER — Encounter (HOSPITAL_COMMUNITY): Payer: Self-pay | Admitting: Family Medicine

## 2018-06-23 ENCOUNTER — Encounter (HOSPITAL_COMMUNITY): Payer: Medicaid Other

## 2018-06-23 ENCOUNTER — Encounter: Payer: Medicaid Other | Admitting: Vascular Surgery

## 2018-06-23 ENCOUNTER — Emergency Department (HOSPITAL_COMMUNITY): Payer: Medicaid Other

## 2018-06-23 DIAGNOSIS — N186 End stage renal disease: Secondary | ICD-10-CM

## 2018-06-23 DIAGNOSIS — L97529 Non-pressure chronic ulcer of other part of left foot with unspecified severity: Secondary | ICD-10-CM

## 2018-06-23 DIAGNOSIS — F418 Other specified anxiety disorders: Secondary | ICD-10-CM

## 2018-06-23 DIAGNOSIS — E1165 Type 2 diabetes mellitus with hyperglycemia: Secondary | ICD-10-CM

## 2018-06-23 DIAGNOSIS — E11621 Type 2 diabetes mellitus with foot ulcer: Secondary | ICD-10-CM | POA: Diagnosis not present

## 2018-06-23 DIAGNOSIS — R6510 Systemic inflammatory response syndrome (SIRS) of non-infectious origin without acute organ dysfunction: Secondary | ICD-10-CM | POA: Diagnosis present

## 2018-06-23 DIAGNOSIS — Z992 Dependence on renal dialysis: Secondary | ICD-10-CM

## 2018-06-23 LAB — URINALYSIS, ROUTINE W REFLEX MICROSCOPIC
Bilirubin Urine: NEGATIVE
Hgb urine dipstick: NEGATIVE
KETONES UR: NEGATIVE mg/dL
LEUKOCYTES UA: NEGATIVE
Nitrite: NEGATIVE
PH: 7 (ref 5.0–8.0)
Protein, ur: >=300 mg/dL — AB
Specific Gravity, Urine: 1.013 (ref 1.005–1.030)

## 2018-06-23 LAB — BASIC METABOLIC PANEL
ANION GAP: 13 (ref 5–15)
BUN: 45 mg/dL — ABNORMAL HIGH (ref 6–20)
CALCIUM: 9.3 mg/dL (ref 8.9–10.3)
CO2: 24 mmol/L (ref 22–32)
Chloride: 100 mmol/L (ref 98–111)
Creatinine, Ser: 5.69 mg/dL — ABNORMAL HIGH (ref 0.44–1.00)
GFR calc Af Amer: 9 mL/min — ABNORMAL LOW (ref >60)
GFR calc non Af Amer: 8 mL/min — ABNORMAL LOW (ref >60)
GLUCOSE: 281 mg/dL — AB (ref 70–99)
Potassium: 3.8 mmol/L (ref 3.5–5.1)
SODIUM: 137 mmol/L (ref 135–145)

## 2018-06-23 LAB — CBG MONITORING, ED: GLUCOSE-CAPILLARY: 253 mg/dL — AB (ref 70–99)

## 2018-06-23 LAB — I-STAT CG4 LACTIC ACID, ED: Lactic Acid, Venous: 1.7 mmol/L (ref 0.5–1.9)

## 2018-06-23 LAB — SEDIMENTATION RATE: Sed Rate: 28 mm/hr — ABNORMAL HIGH (ref 0–22)

## 2018-06-23 LAB — C-REACTIVE PROTEIN: CRP: 2.3 mg/dL — AB (ref <1.0)

## 2018-06-23 LAB — GLUCOSE, CAPILLARY
Glucose-Capillary: 268 mg/dL — ABNORMAL HIGH (ref 70–99)
Glucose-Capillary: 250 mg/dL — ABNORMAL HIGH (ref 70–99)
Glucose-Capillary: 146 mg/dL — ABNORMAL HIGH (ref 70–99)

## 2018-06-23 LAB — INFLUENZA PANEL BY PCR (TYPE A & B)
INFLAPCR: NEGATIVE
Influenza B By PCR: NEGATIVE

## 2018-06-23 LAB — MRSA PCR SCREENING: MRSA by PCR: NEGATIVE

## 2018-06-23 MED ORDER — EPOETIN ALFA 2000 UNIT/ML IJ SOLN: 2000.0000 [IU] | INTRAMUSCULAR | Status: DC

## 2018-06-23 MED ORDER — ACETAMINOPHEN 325 MG PO TABS: 650.0000 mg | ORAL_TABLET | Freq: Four times a day (QID) | ORAL | Status: DC | PRN

## 2018-06-23 MED ORDER — SODIUM CHLORIDE 0.9 % IV SOLN: 100.0000 mL | INTRAVENOUS | Status: DC | PRN

## 2018-06-23 MED ORDER — ATORVASTATIN CALCIUM 10 MG PO TABS
20.0000 mg | ORAL_TABLET | Freq: Every day | ORAL | Status: DC
  Administered 2018-06-23: 20 mg via ORAL
  Filled 2018-06-23: qty 2

## 2018-06-23 MED ORDER — SODIUM CHLORIDE 0.9% FLUSH: 3.0000 mL | INTRAVENOUS | Status: DC | PRN

## 2018-06-23 MED ORDER — PAROXETINE HCL 20 MG PO TABS
20.0000 mg | ORAL_TABLET | Freq: Every day | ORAL | Status: DC
  Administered 2018-06-23: 20 mg via ORAL
  Filled 2018-06-23: qty 1

## 2018-06-23 MED ORDER — CALCIUM CARBONATE ANTACID 500 MG PO CHEW
2.0000 | CHEWABLE_TABLET | Freq: Three times a day (TID) | ORAL | Status: DC
  Administered 2018-06-23 (×3): 400 mg via ORAL
  Filled 2018-06-23 (×3): qty 2

## 2018-06-23 MED ORDER — ACETAMINOPHEN 650 MG RE SUPP: 650.0000 mg | Freq: Four times a day (QID) | RECTAL | Status: DC | PRN

## 2018-06-23 MED ORDER — ALTEPLASE 2 MG IJ SOLR
2.0000 mg | Freq: Once | INTRAMUSCULAR | Status: DC | PRN
  Filled 2018-06-23: qty 2

## 2018-06-23 MED ORDER — INSULIN ASPART 100 UNIT/ML ~~LOC~~ SOLN
0.0000 [IU] | Freq: Three times a day (TID) | SUBCUTANEOUS | Status: DC
  Administered 2018-06-23: 3 [IU] via SUBCUTANEOUS
  Administered 2018-06-23: 1 [IU] via SUBCUTANEOUS
  Administered 2018-06-23: 5 [IU] via SUBCUTANEOUS

## 2018-06-23 MED ORDER — ALPRAZOLAM 0.5 MG PO TABS
1.0000 mg | ORAL_TABLET | Freq: Four times a day (QID) | ORAL | Status: DC
  Administered 2018-06-23 (×3): 1 mg via ORAL
  Filled 2018-06-23 (×3): qty 2

## 2018-06-23 MED ORDER — SODIUM CHLORIDE 0.9 % IV SOLN: 250.0000 mL | INTRAVENOUS | Status: DC | PRN

## 2018-06-23 MED ORDER — HYDROCODONE-ACETAMINOPHEN 10-325 MG PO TABS
1.0000 | ORAL_TABLET | Freq: Four times a day (QID) | ORAL | Status: DC | PRN
  Administered 2018-06-23: 1 via ORAL
  Filled 2018-06-23: qty 1

## 2018-06-23 MED ORDER — HYDROMORPHONE HCL 1 MG/ML IJ SOLN
1.0000 mg | Freq: Three times a day (TID) | INTRAMUSCULAR | Status: DC | PRN
  Administered 2018-06-23 (×2): 1 mg via INTRAVENOUS
  Filled 2018-06-23: qty 1

## 2018-06-23 MED ORDER — SODIUM CHLORIDE 0.9 % IV SOLN
1.0000 g | Freq: Once | INTRAVENOUS | Status: AC
  Administered 2018-06-23: 1 g via INTRAVENOUS
  Filled 2018-06-23: qty 10

## 2018-06-23 MED ORDER — SODIUM CHLORIDE 0.9 % IV SOLN
INTRAVENOUS | Status: DC | PRN
  Administered 2018-06-23: 01:00:00 via INTRAVENOUS

## 2018-06-23 MED ORDER — CHLORHEXIDINE GLUCONATE CLOTH 2 % EX PADS: 6.0000 | MEDICATED_PAD | Freq: Every day | CUTANEOUS | Status: DC

## 2018-06-23 MED ORDER — ORAL CARE MOUTH RINSE
15.0000 mL | Freq: Two times a day (BID) | OROMUCOSAL | Status: DC
  Administered 2018-06-23: 15 mL via OROMUCOSAL

## 2018-06-23 MED ORDER — INSULIN GLARGINE 100 UNIT/ML ~~LOC~~ SOLN
7.0000 [IU] | Freq: Every day | SUBCUTANEOUS | Status: DC
  Filled 2018-06-23 (×2): qty 0.07

## 2018-06-23 MED ORDER — DICYCLOMINE HCL 10 MG PO CAPS
10.0000 mg | ORAL_CAPSULE | Freq: Every day | ORAL | Status: DC
  Administered 2018-06-23: 10 mg via ORAL
  Filled 2018-06-23: qty 1

## 2018-06-23 MED ORDER — HEPARIN SODIUM (PORCINE) 1000 UNIT/ML DIALYSIS
3000.0000 [IU] | INTRAMUSCULAR | Status: DC | PRN
  Filled 2018-06-23: qty 3

## 2018-06-23 MED ORDER — ONDANSETRON HCL 4 MG PO TABS: 4.0000 mg | ORAL_TABLET | Freq: Four times a day (QID) | ORAL | Status: DC | PRN

## 2018-06-23 MED ORDER — COLCHICINE 0.6 MG PO TABS: 0.6000 mg | ORAL_TABLET | ORAL | Status: DC

## 2018-06-23 MED ORDER — CEFUROXIME AXETIL 250 MG PO TABS: ORAL_TABLET | ORAL | 0 refills | Status: DC

## 2018-06-23 MED ORDER — INSULIN ASPART 100 UNIT/ML ~~LOC~~ SOLN
0.0000 [IU] | Freq: Every day | SUBCUTANEOUS | Status: DC
  Administered 2018-06-23: 3 [IU] via SUBCUTANEOUS
  Filled 2018-06-23: qty 1

## 2018-06-23 MED ORDER — BUSPIRONE HCL 5 MG PO TABS
7.5000 mg | ORAL_TABLET | Freq: Two times a day (BID) | ORAL | Status: DC
  Administered 2018-06-23: 7.5 mg via ORAL
  Filled 2018-06-23: qty 2

## 2018-06-23 MED ORDER — CHLORHEXIDINE GLUCONATE 0.12 % MT SOLN
15.0000 mL | Freq: Two times a day (BID) | OROMUCOSAL | Status: DC
  Administered 2018-06-23: 15 mL via OROMUCOSAL
  Filled 2018-06-23: qty 15

## 2018-06-23 MED ORDER — HYDROMORPHONE HCL 1 MG/ML IJ SOLN
INTRAMUSCULAR | Status: AC
  Administered 2018-06-23: 1 mg via INTRAVENOUS
  Filled 2018-06-23: qty 1

## 2018-06-23 MED ORDER — SODIUM CHLORIDE 0.9% FLUSH
3.0000 mL | Freq: Two times a day (BID) | INTRAVENOUS | Status: DC
  Administered 2018-06-23 (×2): 3 mL via INTRAVENOUS

## 2018-06-23 MED ORDER — HEPARIN SODIUM (PORCINE) 1000 UNIT/ML DIALYSIS: 1000.0000 [IU] | INTRAMUSCULAR | Status: DC | PRN

## 2018-06-23 MED ORDER — GABAPENTIN 300 MG PO CAPS
300.0000 mg | ORAL_CAPSULE | Freq: Two times a day (BID) | ORAL | Status: DC
  Administered 2018-06-23 (×2): 300 mg via ORAL
  Filled 2018-06-23 (×2): qty 1

## 2018-06-23 MED ORDER — HEPARIN SODIUM (PORCINE) 5000 UNIT/ML IJ SOLN
5000.0000 [IU] | Freq: Three times a day (TID) | INTRAMUSCULAR | Status: DC
  Administered 2018-06-23 (×2): 5000 [IU] via SUBCUTANEOUS
  Filled 2018-06-23 (×2): qty 1

## 2018-06-23 MED ORDER — HEPARIN SODIUM (PORCINE) 1000 UNIT/ML DIALYSIS
20.0000 [IU]/kg | INTRAMUSCULAR | Status: DC | PRN
  Filled 2018-06-23: qty 4

## 2018-06-23 MED ORDER — ONDANSETRON HCL 4 MG/2ML IJ SOLN: 4.0000 mg | Freq: Four times a day (QID) | INTRAMUSCULAR | Status: DC | PRN

## 2018-06-23 MED ORDER — EPOETIN ALFA 2000 UNIT/ML IJ SOLN
2000.0000 [IU] | INTRAMUSCULAR | Status: DC
  Administered 2018-06-23: 2000 [IU] via INTRAVENOUS
  Filled 2018-06-23: qty 1

## 2018-06-23 NOTE — ED Notes (Signed)
Patient taken to x-ray prior to going to the floor.

## 2018-06-23 NOTE — H&P (Signed)
History and Physical    Martha [name] JME:[id number] DOB: 18-Sep-1965 DOA: 06/22/2018  PCP: Sinda Du, MD   Patient coming from: Home   Chief Complaint: Malaise, nausea, vomiting, polyuria   HPI: Martha [name] is a 52 y.o. 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  Patient reports that she was in her usual state of health yesterday but developed the aforementioned symptoms today.  She denies any abdominal pain or diarrhea.  She denies any cough, shortness of breath, rhinorrhea, or sore throat.  She reports some clear drainage from her left eye that has been chronic and unchanged, and she denies any pain at this site.  She denies any tenderness or drainage from the dialysis catheter in her right chest.  She has some wounds on her abdominal wall where her skin was taken for grafting, but she believes these are healing well.  She also reports an ulcer at the base of her left great toe that she believes is healing.  She denies any neck stiffness or headache.  ED Course: Upon arrival to the ED, patient is found to be febrile to 38.4, and with vitals otherwise normal.  Chemistry panel is notable for BUN of 48 and glucose 298.  CBC features a mild normocytic anemia.  Urinalysis is notable for glucosuria and proteinuria, but not suggestive of infection.  Lactic acid is reassuringly normal.  Blood and urine cultures were collected, 500 cc normal saline was given, and the patient was treated with acetaminophen and Rocephin.  She remains hemodynamically stable and will be observed for ongoing evaluation and management of fever with nausea, vomiting, and malaise in an ESRD patient with central venous  catheter, recent skin grafting, and diabetic toe ulcer.  Review of Systems:  All other systems reviewed and apart from HPI, are negative.  Past Medical History:  Diagnosis Date  . Acid reflux    takes Tums  . Anemia   . Arthritis   . Bipolar 1 disorder ([place])   . Blindness of left eye   . Cervical radiculopathy   . CHF (congestive heart failure) ([place])   . Chronic kidney disease    Stage 5- 01/25/17  . COPD (chronic obstructive pulmonary disease) ([place]) 2014   bronchitis  . Degenerative disc disease, thoracic   . Depression   . Diabetes mellitus    Type II  . Diabetic retinopathy ([place])   . End stage kidney disease ([place])    [facility]  . Hypertension   . Noncompliance with medication regimen   . Noncompliance with medication regimen   . Obesity (BMI 30-39.9)   . OSA (obstructive sleep apnea)    cpap  . Panic attack   . RLS (restless legs syndrome)     Past Surgical History:  Procedure Laterality Date  . AV FISTULA PLACEMENT Right 01/27/2017   Procedure: ARTERIOVENOUS (AV) FISTULA CREATION-RIGHT ARM;  Surgeon: Elam Dutch, MD;  Location: [place];  Service: Vascular;  Laterality: Right;  . BIOPSY N/A 05/17/2013   Procedure: BIOPSY;  Surgeon: [name], MD;  Location: [facility];  Service: Endoscopy;  Laterality: N/A;  . CHOLECYSTECTOMY N/A 04/27/2014   Procedure: LAPAROSCOPIC CHOLECYSTECTOMY;  Surgeon: [name], MD;  Location: [facility];  Service: General;  Laterality: N/A;  . COLONOSCOPY  06/06/2010   ZOX:WRUEAV bleeding secondary to internal hemorrhoids but incomplete evaluation secondary to poor right colon prep/small rectal and sigmoid colon polyps (hyperplastic). PROPOFOL  . COLONOSCOPY  May 2013   Dr. Gilliam/[facility]: 5 mm a descending colon polyp, hyperplastic. Adequate bowel prep.  . ENDOMETRIAL ABLATION    . 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 ENUCLEATION Left 02/04/2018   Procedure: ENUCLEATION WITH  PLACEMENT OF IMPLANT LEFT EYE;  Surgeon: Clista Bernhardt, MD;  Location: [place];  Service: Ophthalmology;  Laterality: Left;  . ESOPHAGOGASTRODUODENOSCOPY  06/06/2010   WUJ:[id number] erythema and edema of body of stomach, with sessile polypoid lesions. bx benign. no h.pylori  . ESOPHAGOGASTRODUODENOSCOPY (EGD) WITH PROPOFOL N/A 05/17/2013   Dr. Oneida Alar: normal esophagus, moderate nodular gastritis, negative path, empiric Savary dilation  . EYE SURGERY  07/2017   sx for glaucoma  . FLEXIBLE SIGMOIDOSCOPY N/A 05/17/2013   Dr. Oneida Alar: moderate sized internal hemorrhoids  . HEMORRHOID BANDING N/A 05/17/2013   Procedure: HEMORRHOID BANDING;  Surgeon: [name], MD;  Location: [facility];  Service: Endoscopy;  Laterality: N/A;  2 bands placed  . INCISION AND DRAINAGE ABSCESS Left 10/11/2013   Procedure: INCISION AND DRAINAGE AND DEBRIDEMENT LEFT BREAST  ABSCESS;  Surgeon: [name], MD;  Location: [facility];  Service: General;  Laterality: Left;  . INSERTION OF DIALYSIS CATHETER N/A 06/08/2018   Procedure: INSERTION OF Right internal jugular TUNNELED  DIALYSIS CATHETER;  Surgeon: Angelia Mould, MD;  Location: [facility];  Service: Vascular;  Laterality: N/A;  . IRRIGATION AND DEBRIDEMENT ABSCESS Right 06/01/2013   Procedure: INCISION AND DRAINAGE AND DEBRIDEMENT ABSCESS RIGHT BREAST;  Surgeon: [name], MD;  Location: [facility];  Service: General;  Laterality: Right;  . LEFT EYE REMOVED Left 01/2018   [facility] on [address].  Marland Kitchen LIGATION OF ARTERIOVENOUS  FISTULA Right 06/08/2018   Procedure: LIGATION OF ARTERIOVENOUS  FISTULA RIGHT ARM;  Surgeon: Angelia Mould, MD;  Location: [place];  Service: Vascular;  Laterality: Right;  . SAVORY DILATION N/A 05/17/2013   Procedure: SAVORY DILATION;  Surgeon: [name], MD;  Location: [facility];  Service: Endoscopy;  Laterality: N/A;  14/15/16  . SKIN FULL THICKNESS GRAFT Left 05/05/2018   Procedure: ABDOMINAL DERMIS FAT  SKIN GRAFT FULL THICKNESS LEFT EYE;  Surgeon: Clista Bernhardt, MD;  Location: [place];  Service: Ophthalmology;  Laterality: Left;  . TUBAL LIGATION       reports that she quit smoking about 10 years ago. Her smoking use included cigarettes. She has a 37.50 pack-year smoking history. She has never used smokeless tobacco. She reports that she does not drink alcohol or use drugs.  Allergies  Allergen Reactions  . Tape Other (See Comments)    UNSPECIFIED REACTION  [Plastic Tape]  . Codeine Nausea And Vomiting    Family History  Adopted: Yes  Family history unknown: Yes     Prior to Admission medications   Medication Sig Start Date End Date Taking? Authorizing Provider  atorvastatin (LIPITOR) 20 MG tablet Take 20 mg by mouth daily.    Yes [provider]  busPIRone (BUSPAR) 7.5 MG tablet Take 7.5 mg by mouth 2 (two) times daily.   Yes [provider]  calcium carbonate (TUMS EX) 750 MG chewable tablet Chew 1-3 tablets by mouth See admin instructions. Patient takes 3 tablets with meals and  1 tablet with snacks three times a day   Yes [provider]  colchicine 0.6 MG tablet Take 1 tablet (0.6 mg total) by mouth every other day. 11/19/16  Yes Sinda Du, MD  HYDROcodone-acetaminophen [facility]) 10-325 MG tablet Take 1 tablet by mouth every 6 (six) hours as needed for moderate pain. Patient taking differently: Take 1 tablet by mouth 4 (four) times daily.  01/27/17  Yes Rhyne, Samantha J, PA-C  Insulin Glargine (LANTUS SOLOSTAR) 100 UNIT/ML Solostar Pen Inject 15 Units into the skin at bedtime. 02/27/18  Yes Kathie Dike, MD  ALPRAZolam Duanne Moron) 1 MG tablet Take 1 mg by mouth 4 (four) times daily.     [provider]  dicyclomine (BENTYL) 10 MG capsule Take 1 capsule (10 mg total) by mouth 4 (four) times daily -  before meals and at bedtime. Patient taking differently: Take 10 mg by mouth daily.  10/08/17   Annitta Needs, NP  gabapentin (NEURONTIN) 300 MG  capsule Take 300 mg by mouth 2 (two) times daily.    [provider]  lidocaine-prilocaine (EMLA) cream Apply 1 application topically as needed (topical anesthesia for hemodialysis if Gebauers and Lidocaine injection are ineffective.). 04/25/17   Johnson, Clanford L, MD  oxyCODONE (ROXICODONE) 5 MG immediate release tablet Take 1 tablet (5 mg total) by mouth every 4 (four) hours as needed. Patient not taking: Reported on 06/22/2018 06/08/18   Angelia Mould, MD  PARoxetine (PAXIL) 20 MG tablet Take 1 daily. This is to prevent panic attacks Patient taking differently: Take 20-40 mg by mouth daily. This is to prevent panic attacks 10/28/16   Sinda Du, MD  promethazine (PHENERGAN) 6.25 MG/5ML syrup Take 20 mLs (25 mg total) by mouth at bedtime for 3 days. 05/05/18 05/08/18  Clista Bernhardt, MD  torsemide (DEMADEX) 20 MG tablet Take 20 mg by mouth daily.    [provider]    Physical Exam: Vitals:   06/22/18 2118 06/22/18 2119 06/23/18 0035 06/23/18 0100  BP: (!) 151/100  (!) 148/81 (!) 150/82  Pulse: 96  85 84  Resp: 18  18   Temp: 100 F (37.8 C)  (!) 101.2 F (38.4 C)   TempSrc: Oral  Oral   SpO2: 98%  93% 94%  Weight:  (!) 154.7 kg    Height:        Constitutional: NAD, calm  Eyes: Left eye surgically absent, lids and conjunctivae normal ENMT: Mucous membranes are moist. Posterior pharynx clear of any exudate or lesions.   Neck: normal, supple, no masses, no thyromegaly Respiratory: clear to auscultation bilaterally, no wheezing, no crackles. Normal respiratory effort.    Cardiovascular: S1 & S2 heard, regular rate and rhythm. No extremity edema.     Abdomen: No distension, no tenderness, soft. Bowel sounds normal.  Musculoskeletal: no clubbing / cyanosis. No joint deformity upper and lower extremities.    Skin: Superficial ulcerations at abdominal wall. Ulcer at plantar aspect left first toe. Warm, dry, well-perfused. Neurologic: No facial asymmetry  aside from left eye absence. Sensation to light touch diminished in feet. Moving all extremities.  Psychiatric: Alert and oriented x 3. Calm, cooperative.    Labs on Admission: I have personally reviewed following labs and imaging studies  CBC: Recent Labs  Lab 06/22/18 2209  WBC 9.5  NEUTROABS 8.1*  HGB 11.2*  HCT 35.4*  MCV 89.8  PLT 937   Basic Metabolic Panel: Recent Labs  Lab 06/22/18 2209  NA 140  K  3.8  CL 102  CO2 24  GLUCOSE 298*  BUN 48*  CREATININE 5.53*  CALCIUM 9.8   GFR: Estimated Creatinine Clearance: 19 mL/min (A) (by C-G formula based on SCr of 5.53 mg/dL (H)). Liver Function Tests: Recent Labs  Lab 06/22/18 2209  AST 14*  ALT 12  ALKPHOS 80  BILITOT 0.6  PROT 7.8  ALBUMIN 3.9   No results for input(s): LIPASE, AMYLASE in the last 168 hours. No results for input(s): AMMONIA in the last 168 hours. Coagulation Profile: No results for input(s): INR, PROTIME in the last 168 hours. Cardiac Enzymes: No results for input(s): CKTOTAL, CKMB, CKMBINDEX, TROPONINI in the last 168 hours. BNP (last 3 results) No results for input(s): PROBNP in the last 8760 hours. HbA1C: No results for input(s): HGBA1C in the last 72 hours. CBG: No results for input(s): GLUCAP in the last 168 hours. Lipid Profile: No results for input(s): CHOL, HDL, LDLCALC, TRIG, CHOLHDL, LDLDIRECT in the last 72 hours. Thyroid Function Tests: No results for input(s): TSH, T4TOTAL, FREET4, T3FREE, THYROIDAB in the last 72 hours. Anemia Panel: No results for input(s): VITAMINB12, FOLATE, FERRITIN, TIBC, IRON, RETICCTPCT in the last 72 hours. Urine analysis:    Component Value Date/Time   COLORURINE YELLOW 06/22/2018 2355   APPEARANCEUR HAZY (A) 06/22/2018 2355   LABSPEC 1.013 06/22/2018 2355   PHURINE 7.0 06/22/2018 2355   GLUCOSEU >=500 (A) 06/22/2018 2355   HGBUR NEGATIVE 06/22/2018 2355   BILIRUBINUR NEGATIVE 06/22/2018 2355   KETONESUR NEGATIVE 06/22/2018 2355    PROTEINUR >=300 (A) 06/22/2018 2355   UROBILINOGEN 0.2 03/17/2015 2236   NITRITE NEGATIVE 06/22/2018 2355   LEUKOCYTESUR NEGATIVE 06/22/2018 2355   Sepsis Labs: @LABRCNTIP (procalcitonin:4,lacticidven:4) )No results found for this or any previous visit (from the past 240 hour(s)).   Radiological Exams on Admission: No results found.  EKG: Not performed.   Assessment/Plan   1. SIRS  - Presents with one day of malaise, nausea/vomiting, and polyuria  - Found to be febrile with normal wbc and lactate, no tachycardia or hypotension   - UA not suggestive of infection, abdominal exam is benign, and no respiratory signs/symptoms, no meningismu s  - There is a dialysis catheter in right chest that looks okay, left great toe ulcer without active drainage or surrounding erythema, left eye socket status-post skin grafting several months ago without pain, redness, or purulence, and abdominal wall wounds that appear to be healing appropriate and are unlikely source of fever - Blood and urine cultures collected from ED and a dose of Rocephin was given  - Plan to check radiographs of left foot, check inflammatory markers, follow cultures, and follow clinical course off of antibiotics    2. ESRD  - Usually on MWF schedule  - No hyperkalemia, uremia, acidosis or significant HTN or hypervolemia on admission    3. Insulin-dependent DM  - A1c was 7.0% in July  - Managed at home with Lantus  - Serum glucose is 298 in ED without DKA  - Continue Lantus and start a SSI with Novolog    4. Depression with anxiety  - Continue Buspar, Paxil, and Xanax     DVT prophylaxis: sq heparin  Code Status: Full  Family Communication: Husband updated at bedside Consults called: None Admission status: Observation    Vianne Bulls, MD [facility] Pager [phone]  If 7PM-7AM, please contact night-coverage [email or web address] Password [email or web address]  06/23/2018, 1:39 AM

## 2018-06-23 NOTE — Procedures (Signed)
HEMODIALYSIS TREATMENT NOTE:   4 hour low-heparin dialysis completed via right chest wall tunneled catheter.  Exit site unremarkable. Goal met: 2 liters removed without interruption in ultrafiltration.  All blood was returned.  Positional HD machine alarms.  Suspect fibrin sheath formation at cath tip.  Would benefit from Alteplase instillation but no sterile water for injection available.  Will defer to outpatient dialysis clinic--I'll call them tomorrow to arrange prior to her next dialysis.  [place] policy dictates pt's HBsAg level be checked as her most recent result (negative on 05/10/18 @ [facility]) is >30 days, however she is currently receiving the Engerix vaccine series. Her most recent dose was on 06/14/18.  I did NOT recheck the lab, for fear of a false-positive result, however I chemically disinfected the dialysis machine after today's treatment.   Rockwell Alexandria, RN

## 2018-06-23 NOTE — Discharge Summary (Signed)
Physician Discharge Summary  Patient ID: Martha [name] MRN: [id number] DOB/AGE: 09-02-1965 52 y.o. Primary Care Physician:Taj Nevins, Percell Miller, MD Admit date: 06/22/2018 Discharge date: 06/23/2018    Discharge Diagnoses:   Principal Problem:   SIRS (systemic inflammatory response syndrome) (HCC) Active Problems:   Depression with anxiety   Diabetic ulcer of left great toe ([place])   Uncontrolled type 2 diabetes mellitus with hyperglycemia, without long-term current use of insulin (HCC)   ESRD (end stage renal disease) on dialysis [facility])   History of enucleation of left eyeball   Allergies as of 06/23/2018      Reactions   Tape Other (See Comments)   UNSPECIFIED REACTION  [Plastic Tape]   Codeine Nausea And Vomiting      Medication List    TAKE these medications   ALPRAZolam 1 MG tablet Commonly known as:  XANAX Take 1 mg by mouth 4 (four) times daily.   atorvastatin 20 MG tablet Commonly known as:  LIPITOR Take 20 mg by mouth daily.   busPIRone 7.5 MG tablet Commonly known as:  BUSPAR Take 7.5 mg by mouth 2 (two) times daily.   calcium carbonate 750 MG chewable tablet Commonly known as:  TUMS EX Chew 1-3 tablets by mouth See admin instructions. Patient takes 3 tablets with meals and 1 tablet with snacks three times a day   cefUROXime 250 MG tablet Commonly known as:  CEFTIN Take 2 tablets after each dialysis x5 occasions   colchicine 0.6 MG tablet Take 1 tablet (0.6 mg total) by mouth every other day.   dicyclomine 10 MG capsule Commonly known as:  BENTYL Take 1 capsule (10 mg total) by mouth 4 (four) times daily -  before meals and at bedtime. What changed:  when to take this   gabapentin 300 MG capsule Commonly known as:  NEURONTIN Take 300 mg by mouth 2 (two) times daily.   HYDROcodone-acetaminophen 10-325 MG tablet Commonly known as:  NORCO Take 1 tablet by mouth every 6 (six) hours as needed for moderate pain. What changed:  when to take this   Insulin  Glargine 100 UNIT/ML Solostar Pen Commonly known as:  LANTUS Inject 15 Units into the skin at bedtime.   lidocaine-prilocaine cream Commonly known as:  EMLA Apply 1 application topically as needed (topical anesthesia for hemodialysis if Gebauers and Lidocaine injection are ineffective.).   oxyCODONE 5 MG immediate release tablet Commonly known as:  Oxy IR/ROXICODONE Take 1 tablet (5 mg total) by mouth every 4 (four) hours as needed.   PARoxetine 20 MG tablet Commonly known as:  PAXIL Take 1 daily. This is to prevent panic attacks What changed:    how much to take  how to take this  when to take this  additional instructions   promethazine 6.25 MG/5ML syrup Commonly known as:  PHENERGAN Take 20 mLs (25 mg total) by mouth at bedtime for 3 days.   torsemide 20 MG tablet Commonly known as:  DEMADEX Take 20 mg by mouth daily.       Discharged Condition: Improved    Consults: Nephrology  Significant Diagnostic Studies: Dg Chest 2 View  Result Date: 06/23/2018 CLINICAL DATA:  Fever EXAM: CHEST - 2 VIEW COMPARISON:  06/08/2018 FINDINGS: Right IJ dual lumen catheter is in unchanged position with tip at the proximal right atrium. Lungs are clear. Cardiomediastinal contours unchanged. IMPRESSION: No active cardiopulmonary disease. Electronically Signed   By: Ulyses Jarred M.D.   On: 06/23/2018 02:09   Dg Chest [facility]  1 View  Result Date: 06/08/2018 CLINICAL DATA:  Postop right diatek catheter placement. End-stage renal disease. EXAM: PORTABLE CHEST 1 VIEW COMPARISON:  Chest x-ray dated 02/25/2018 FINDINGS: Catheter tip at the cavoatrial junction. No pneumothorax. Heart size and vascularity are normal. Lungs are clear. No bone abnormality. IMPRESSION: No acute cardiopulmonary abnormality. Catheter tip at the cavoatrial junction. Electronically Signed   By: Lorriane Shire M.D.   On: 06/08/2018 11:03   Dg Foot 2 Views Left  Result Date: 06/23/2018 CLINICAL DATA:  Fever with  left great toe ulcer. EXAM: LEFT FOOT - 2 VIEW COMPARISON:  None. FINDINGS: There is an ulcer at the plantar surface of the left great toe. No visible extension to the osseous surface. There is no osteolysis or other acute osseous abnormality. There are posterior and plantar calcaneal enthesophytes. IMPRESSION: Plantar left great toe ulcer without evidence of osteomyelitis. Electronically Signed   By: Ulyses Jarred M.D.   On: 06/23/2018 03:15   Dg Fluoro Guide Cv Line-no Report  Result Date: 06/08/2018 Fluoroscopy was utilized by the requesting physician.  No radiographic interpretation.   Vas [place] Steal Exam  Result Date: 06/03/2018 DIALYSIS STEAL Reason for Exam: Numbness. Access Site: Right Upper Extremity. Access Type: Brachial-cephalic AVF. History: Right pinkie finger numbness with dialysis. Performing Technologist: Ralene Cork RVT  Examination Guidelines: A complete evaluation includes B-mode imaging, spectral Doppler, color Doppler, and power Doppler as needed of all accessible portions of each vessel. Bilateral testing is considered an integral part of a complete examination. Limited examinations for reoccurring indications may be performed as noted.  Findings: +------------------+-------------+----------+---------+----------+-------------+ Upper Arm         Diameter (cm)Depth (cm)BranchingPSV (cm/s) Flow Volume  Arteriovenous                                                 (ml/min)    Fistula                                                                   +------------------+-------------+----------+---------+----------+-------------+ Distal Brachial                                      430        1560      Artery                                                                    +------------------+-------------+----------+---------+----------+-------------+ AVF anastomosis                                      410                   +------------------+-------------+----------+---------+----------+-------------+ just after  1.6                            93                  anastomosis                                                               +------------------+-------------+----------+---------+----------+-------------+ outflow vein           1.7                            64                  distal upper arm                                                          +------------------+-------------+----------+---------+----------+-------------+ outflow vein mid       1.1                            89                  upper arm                                                                 +------------------+-------------+----------+---------+----------+-------------+  outflow vein proximal upper arm: 275cm/s with turbulent flow. Cannot rule out soft thrombus. Branch 0.711cm in the proximal upper arm. Focal thrombus in the outflow vein at the antecubital fossa.  +-----------------+-----------------+-------------------+----------------------+                                                             Comments        +-----------------+-----------------+-------------------+----------------------+                  PSV w compressionPSV w/o compression                       +-----------------+-----------------+-------------------+----------------------+ Right Radial            87                43         velocity doubles with  Artery                                                fistula compression   +-----------------+-----------------+-------------------+----------------------+  Summary: The right radial PSV  has increased with compression. Patent right brachio-cephalic AVF with findings as above. *See table(s) above for measurements and observations. Diagnosing physician: Ruta Hinds MD Electronically signed by Ruta Hinds MD on 06/03/2018 at 6:24:01 PM.     Final     Lab Results: Basic Metabolic Panel: Recent Labs    06/22/18 2209 06/23/18 0015  NA 140 137  K 3.8 3.8  CL 102 100  CO2 24 24  GLUCOSE 298* 281*  BUN 48* 45*  CREATININE 5.53* 5.69*  CALCIUM 9.8 9.3   Liver Function Tests: Recent Labs    06/22/18 2209  AST 14*  ALT 12  ALKPHOS 80  BILITOT 0.6  PROT 7.8  ALBUMIN 3.9     CBC: Recent Labs    06/22/18 2209  WBC 9.5  NEUTROABS 8.1*  HGB 11.2*  HCT 35.4*  MCV 89.8  PLT 150    Recent Results (from the past 240 hour(s))  Blood culture (routine x 2)     Status: None (Preliminary result)   Collection Time: 06/23/18 12:07 AM  Result Value Ref Range Status   Specimen Description BLOOD LEFT ARM  Final   Special Requests AEROBIC BOTTLE ONLY Blood Culture adequate volume  Final   Culture   Final    NO GROWTH < 12 HOURS Performed at [facility], [address] [place], [place] [address]    Report Status PENDING  Incomplete  Blood culture (routine x 2)     Status: None (Preliminary result)   Collection Time: 06/23/18 12:15 AM  Result Value Ref Range Status   Specimen Description BLOOD LEFT ARM  Final   Special Requests   Final    BOTTLES DRAWN AEROBIC AND ANAEROBIC Blood Culture adequate volume   Culture   Final    NO GROWTH < 12 HOURS Performed at [facility], [address] [place], [place] [address]    Report Status PENDING  Incomplete  MRSA PCR Screening     Status: None   Collection Time: 06/23/18  3:24 AM  Result Value Ref Range Status   MRSA by PCR NEGATIVE NEGATIVE Final    Comment:        The GeneXpert MRSA Assay (FDA approved for NASAL specimens only), is one component of a comprehensive MRSA colonization surveillance program. It is not intended to diagnose MRSA infection nor to guide or monitor treatment for MRSA infections. Performed at [facility], [address], [place], [place] [address]      Hospital Course: This is a 52 year old who has end-stage renal disease on the basis  of hypertension and diabetes who came to the emergency department because she had nausea and vomiting and had a temperature greater than 101.  She did not have an obvious source of infection.  She improved with fluid resuscitation.  She felt back to baseline and wanted to go home.  She needed dialysis before she left.  Discharge Exam: Blood pressure (!) 144/65, pulse 81, temperature 99.2 F (37.3 C), temperature source Oral, resp. rate (!) 21, height 5\' 7"  (1.702 m), weight (!) 154.7 kg, SpO2 97 %. She is awake and alert.  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  She has permacath in her right subclavian area.  She has an ulcerated area on her left great toe but no osteomyelitis and it does not appear to be infected  Disposition: Home.  She will continue dialysis.  She will take Ceftin orally after dialysis.    Follow-up Information  Schedule an appointment as soon as possible for a visit  with Sinda Du, MD.   Specialty:  Pulmonary Disease Contact information: [address] [place] [address] [phone]        [facility] EMERGENCY DEPARTMENT.   Specialty:  Emergency Medicine Why:  As needed, If symptoms worsen Contact information: [address] [id number] [place] [phone]          Signed: Alonza Bogus   06/23/2018, 9:12 AM

## 2018-06-23 NOTE — Progress Notes (Signed)
Subjective: She was admitted with febrile illness and systemic inflammatory response syndrome.  She feels much better.  She was felt to be dehydrated and that has improved.  She has a permacath in the right subclavian area but it does not appear to be infected.  She has an ulcerated area on her toe but there is no evidence of osteomyelitis.  Her lungs are clear.  Chest x-ray shows no infection.  Urinalysis certainly not convincing for UTI.  She received a dose of Rocephin and says she feels much better.  She wants to go home but feels like she needs to dialyze before she goes  Objective: Vital signs in last 24 hours: Temp:  [99.2 F (37.3 C)-101.2 F (38.4 C)] 99.2 F (37.3 C) (11/27 0315) Pulse Rate:  [81-96] 81 (11/27 0315) Resp:  [18-21] 21 (11/27 0315) BP: (144-151)/(65-100) 144/65 (11/27 0315) SpO2:  [93 %-98 %] 97 % (11/27 0315) Weight:  [154.7 kg] 154.7 kg (11/26 2119) Weight change:  Last BM Date: 06/22/18  Intake/Output from previous day: 11/26 0701 - 11/27 0700 In: 600 [IV Piggyback:600] Out: -   PHYSICAL EXAM General appearance: alert, cooperative, no distress and morbidly obese Resp: clear to auscultation bilaterally Cardio: regular rate and rhythm, S1, S2 normal, no murmur, click, rub or gallop GI: soft, non-tender; bowel sounds normal; no masses,  no organomegaly Extremities: extremities normal, atraumatic, no cyanosis or edema  Lab Results:  Results for orders placed or performed during the hospital encounter of 06/22/18 (from the past 48 hour(s))  CBC with Differential/Platelet     Status: Abnormal   Collection Time: 06/22/18 10:09 PM  Result Value Ref Range   WBC 9.5 4.0 - 10.5 K/uL   RBC 3.94 3.87 - 5.11 MIL/uL   Hemoglobin 11.2 (L) 12.0 - 15.0 g/dL   HCT 35.4 (L) 36.0 - 46.0 %   MCV 89.8 80.0 - 100.0 fL   MCH 28.4 26.0 - 34.0 pg   MCHC 31.6 30.0 - 36.0 g/dL   RDW 15.2 11.5 - 15.5 %   Platelets 150 150 - 400 K/uL   nRBC 0.0 0.0 - 0.2 %   Neutrophils  Relative % 85 %   Neutro Abs 8.1 (H) 1.7 - 7.7 K/uL   Lymphocytes Relative 8 %   Lymphs Abs 0.7 0.7 - 4.0 K/uL   Monocytes Relative 5 %   Monocytes Absolute 0.5 0.1 - 1.0 K/uL   Eosinophils Relative 1 %   Eosinophils Absolute 0.1 0.0 - 0.5 K/uL   Basophils Relative 0 %   Basophils Absolute 0.0 0.0 - 0.1 K/uL   Immature Granulocytes 1 %   Abs Immature Granulocytes 0.06 0.00 - 0.07 K/uL    Comment: Performed at [facility], [address], [place], [place] [address]  Comprehensive metabolic panel     Status: Abnormal   Collection Time: 06/22/18 10:09 PM  Result Value Ref Range   Sodium 140 135 - 145 mmol/L   Potassium 3.8 3.5 - 5.1 mmol/L   Chloride 102 98 - 111 mmol/L   CO2 24 22 - 32 mmol/L   Glucose, Bld 298 (H) 70 - 99 mg/dL   BUN 48 (H) 6 - 20 mg/dL   Creatinine, Ser 5.53 (H) 0.44 - 1.00 mg/dL   Calcium 9.8 8.9 - 10.3 mg/dL   Total Protein 7.8 6.5 - 8.1 g/dL   Albumin 3.9 3.5 - 5.0 g/dL   AST 14 (L) 15 - 41 U/L   ALT 12 0 - 44 U/L  Alkaline Phosphatase 80 38 - 126 U/L   Total Bilirubin 0.6 0.3 - 1.2 mg/dL   GFR calc non Af Amer 8 (L) >60 mL/min   GFR calc Af Amer 10 (L) >60 mL/min   Anion gap 14 5 - 15    Comment: Performed at [facility], [address] [place], [place] [address]  Sedimentation rate     Status: Abnormal   Collection Time: 06/22/18 10:09 PM  Result Value Ref Range   Sed Rate 28 (H) 0 - 22 mm/hr    Comment: Performed at [facility], [address] [place], [place] [address]  Urinalysis, Routine w reflex microscopic     Status: Abnormal   Collection Time: 06/22/18 11:55 PM  Result Value Ref Range   Color, Urine YELLOW YELLOW   APPearance HAZY (A) CLEAR   Specific Gravity, Urine 1.013 1.005 - 1.030   pH 7.0 5.0 - 8.0   Glucose, UA >=500 (A) NEGATIVE mg/dL   Hgb urine dipstick NEGATIVE NEGATIVE   Bilirubin Urine NEGATIVE NEGATIVE   Ketones, ur NEGATIVE NEGATIVE mg/dL   Protein, ur >=300 (A) NEGATIVE mg/dL   Nitrite NEGATIVE NEGATIVE   Leukocytes,  UA NEGATIVE NEGATIVE   RBC / HPF 0-5 0 - 5 RBC/hpf   WBC, UA 0-5 0 - 5 WBC/hpf   Bacteria, UA RARE (A) NONE SEEN   Squamous Epithelial / LPF 11-20 0 - 5    Comment: Performed at [facility], [address], [place], [place] [address]  Blood culture (routine x 2)     Status: None (Preliminary result)   Collection Time: 06/23/18 12:07 AM  Result Value Ref Range   Specimen Description BLOOD LEFT ARM    Special Requests AEROBIC BOTTLE ONLY Blood Culture adequate volume    Culture      NO GROWTH < 12 HOURS Performed at [facility], [address], [place], [place] [address]    Report Status PENDING   Blood culture (routine x 2)     Status: None (Preliminary result)   Collection Time: 06/23/18 12:15 AM  Result Value Ref Range   Specimen Description BLOOD LEFT ARM    Special Requests      BOTTLES DRAWN AEROBIC AND ANAEROBIC Blood Culture adequate volume   Culture      NO GROWTH < 12 HOURS Performed at [facility], [address] [place], [place] [address]    Report Status PENDING   Basic metabolic panel     Status: Abnormal   Collection Time: 06/23/18 12:15 AM  Result Value Ref Range   Sodium 137 135 - 145 mmol/L   Potassium 3.8 3.5 - 5.1 mmol/L   Chloride 100 98 - 111 mmol/L   CO2 24 22 - 32 mmol/L   Glucose, Bld 281 (H) 70 - 99 mg/dL   BUN 45 (H) 6 - 20 mg/dL   Creatinine, Ser 5.69 (H) 0.44 - 1.00 mg/dL   Calcium 9.3 8.9 - 10.3 mg/dL   GFR calc non Af Amer 8 (L) >60 mL/min   GFR calc Af Amer 9 (L) >60 mL/min   Anion gap 13 5 - 15    Comment: Performed at [facility], [address], [place], [place] [address]  I-Stat CG4 Lactic Acid, ED     Status: None   Collection Time: 06/23/18 12:27 AM  Result Value Ref Range   Lactic Acid, Venous 1.70 0.5 - 1.9 mmol/L  Influenza panel by PCR (type A & B)  Status: None   Collection Time: 06/23/18 12:51 AM  Result Value Ref Range   Influenza A By PCR NEGATIVE NEGATIVE   Influenza B By PCR NEGATIVE NEGATIVE    Comment: (NOTE) The  Xpert Xpress Flu assay is intended as an aid in the diagnosis of  influenza and should not be used as a sole basis for treatment.  This  assay is FDA approved for nasopharyngeal swab specimens only. Nasal  washings and aspirates are unacceptable for Xpert Xpress Flu testing. Performed at [facility], [address] [place], [place] [address]   CBG monitoring, ED     Status: Abnormal   Collection Time: 06/23/18  2:24 AM  Result Value Ref Range   Glucose-Capillary 253 (H) 70 - 99 mg/dL  MRSA PCR Screening     Status: None   Collection Time: 06/23/18  3:24 AM  Result Value Ref Range   MRSA by PCR NEGATIVE NEGATIVE    Comment:        The GeneXpert MRSA Assay (FDA approved for NASAL specimens only), is one component of a comprehensive MRSA colonization surveillance program. It is not intended to diagnose MRSA infection nor to guide or monitor treatment for MRSA infections. Performed at [facility], [address], [place], [place] [address]   Glucose, capillary     Status: Abnormal   Collection Time: 06/23/18  7:23 AM  Result Value Ref Range   Glucose-Capillary 268 (H) 70 - 99 mg/dL    ABGS No results for input(s): PHART, PO2ART, TCO2, HCO3 in the last 72 hours.  Invalid input(s): PCO2 CULTURES Recent Results (from the past 240 hour(s))  Blood culture (routine x 2)     Status: None (Preliminary result)   Collection Time: 06/23/18 12:07 AM  Result Value Ref Range Status   Specimen Description BLOOD LEFT ARM  Final   Special Requests AEROBIC BOTTLE ONLY Blood Culture adequate volume  Final   Culture   Final    NO GROWTH < 12 HOURS Performed at [facility], [address] [place], [place] [address]    Report Status PENDING  Incomplete  Blood culture (routine x 2)     Status: None (Preliminary result)   Collection Time: 06/23/18 12:15 AM  Result Value Ref Range Status   Specimen Description BLOOD LEFT ARM  Final   Special Requests   Final    BOTTLES DRAWN AEROBIC AND  ANAEROBIC Blood Culture adequate volume   Culture   Final    NO GROWTH < 12 HOURS Performed at [facility], [address] [place], [place] [address]    Report Status PENDING  Incomplete  MRSA PCR Screening     Status: None   Collection Time: 06/23/18  3:24 AM  Result Value Ref Range Status   MRSA by PCR NEGATIVE NEGATIVE Final    Comment:        The GeneXpert MRSA Assay (FDA approved for NASAL specimens only), is one component of a comprehensive MRSA colonization surveillance program. It is not intended to diagnose MRSA infection nor to guide or monitor treatment for MRSA infections. Performed at [facility], [address], [place], [place] [address]    Studies/Results: Dg Chest 2 View  Result Date: 06/23/2018 CLINICAL DATA:  Fever EXAM: CHEST - 2 VIEW COMPARISON:  06/08/2018 FINDINGS: Right IJ dual lumen catheter is in unchanged position with tip at the proximal right atrium. Lungs are clear. Cardiomediastinal contours unchanged. IMPRESSION: No active cardiopulmonary disease. Electronically Signed   By:  Ulyses Jarred M.D.   On: 06/23/2018 02:09   Dg Foot 2 Views Left  Result Date: 06/23/2018 CLINICAL DATA:  Fever with left great toe ulcer. EXAM: LEFT FOOT - 2 VIEW COMPARISON:  None. FINDINGS: There is an ulcer at the plantar surface of the left great toe. No visible extension to the osseous surface. There is no osteolysis or other acute osseous abnormality. There are posterior and plantar calcaneal enthesophytes. IMPRESSION: Plantar left great toe ulcer without evidence of osteomyelitis. Electronically Signed   By: Ulyses Jarred M.D.   On: 06/23/2018 03:15    Medications:  Prior to Admission:  Medications Prior to Admission  Medication Sig Dispense Refill Last Dose  . ALPRAZolam (XANAX) 1 MG tablet Take 1 mg by mouth 4 (four) times daily.    06/22/2018 at Unknown time  . atorvastatin (LIPITOR) 20 MG tablet Take 20 mg by mouth daily.    06/22/2018 at Unknown time  .  busPIRone (BUSPAR) 7.5 MG tablet Take 7.5 mg by mouth 2 (two) times daily.   06/22/2018 at Unknown time  . calcium carbonate (TUMS EX) 750 MG chewable tablet Chew 1-3 tablets by mouth See admin instructions. Patient takes 3 tablets with meals and 1 tablet with snacks three times a day   06/22/2018 at Unknown time  . colchicine 0.6 MG tablet Take 1 tablet (0.6 mg total) by mouth every other day. 16 tablet 12 06/22/2018 at Unknown time  . dicyclomine (BENTYL) 10 MG capsule Take 1 capsule (10 mg total) by mouth 4 (four) times daily -  before meals and at bedtime. (Patient taking differently: Take 10 mg by mouth daily. ) 120 capsule 3 06/22/2018 at Unknown time  . gabapentin (NEURONTIN) 300 MG capsule Take 300 mg by mouth 2 (two) times daily.   06/23/2018 at Unknown time  . HYDROcodone-acetaminophen (NORCO) 10-325 MG tablet Take 1 tablet by mouth every 6 (six) hours as needed for moderate pain. (Patient taking differently: Take 1 tablet by mouth 4 (four) times daily. ) 6 tablet 0 06/22/2018 at Unknown time  . Insulin Glargine (LANTUS SOLOSTAR) 100 UNIT/ML Solostar Pen Inject 15 Units into the skin at bedtime. 15 mL 11 Past Week at Unknown time  . lidocaine-prilocaine (EMLA) cream Apply 1 application topically as needed (topical anesthesia for hemodialysis if Gebauers and Lidocaine injection are ineffective.). 30 g 0 Past Month at Unknown time  . oxyCODONE (ROXICODONE) 5 MG immediate release tablet Take 1 tablet (5 mg total) by mouth every 4 (four) hours as needed. (Patient not taking: Reported on 06/22/2018) 12 tablet 0 Not Taking at Unknown time  . PARoxetine (PAXIL) 20 MG tablet Take 1 daily. This is to prevent panic attacks (Patient taking differently: Take 20-40 mg by mouth daily. This is to prevent panic attacks) 30 tablet 12 06/07/2018 at Unknown time  . promethazine (PHENERGAN) 6.25 MG/5ML syrup Take 20 mLs (25 mg total) by mouth at bedtime for 3 days. 120 mL 1   . torsemide (DEMADEX) 20 MG tablet Take  20 mg by mouth daily.   Past Week at Unknown time   Scheduled: . ALPRAZolam  1 mg Oral QID  . atorvastatin  20 mg Oral q1800  . busPIRone  7.5 mg Oral BID  . calcium carbonate  2 tablet Oral TID WC  . chlorhexidine  15 mL Mouth Rinse BID  . [START ON 06/24/2018] colchicine  0.6 mg Oral Q48H  . dicyclomine  10 mg Oral Daily  . gabapentin  300 mg  Oral BID  . heparin  5,000 Units Subcutaneous Q8H  . insulin aspart  0-5 Units Subcutaneous QHS  . insulin aspart  0-9 Units Subcutaneous TID WC  . insulin glargine  7 Units Subcutaneous QHS  . mouth rinse  15 mL Mouth Rinse q12n4p  . PARoxetine  20 mg Oral Daily  . sodium chloride flush  3 mL Intravenous Q12H   Continuous: . sodium chloride     CZY:SAYTKZ chloride, acetaminophen **OR** acetaminophen, HYDROcodone-acetaminophen, HYDROmorphone (DILAUDID) injection, ondansetron **OR** ondansetron (ZOFRAN) IV, sodium chloride flush  Assesment: She was admitted with fever and systemic inflammatory response syndrome.  She seemed to be dehydrated which is improved with fluid resuscitation.  She has no complaints now.  Temperature is 99 now.  No convincing evidence of bacterial infection at this point although cultures are pending  She has end-stage renal disease on dialysis and she is due for dialysis today  She has a diabetic ulcer but it does not appear to be infected  She has diabetes which is fairly well controlled  She had fairly recent enucleation of her left eye but no obvious infection  She has permacath and we do not see definite evidence of infection of that. Principal Problem:   SIRS (systemic inflammatory response syndrome) (HCC) Active Problems:   Depression with anxiety   Diabetic ulcer of left great toe ([place])   Uncontrolled type 2 diabetes mellitus with hyperglycemia, without long-term current use of insulin (HCC)   ESRD (end stage renal disease) on dialysis [facility])   History of enucleation of left eyeball    Plan: Possible  discharge later today.  For dialysis if okay with nephrology consultation    LOS: 0 days   Lonzo Saulter L 06/23/2018, 9:07 AM

## 2018-06-23 NOTE — Progress Notes (Signed)
Discharged home with instructions given on medications,and follow up visits,patient verbalized understanding. IV discontinued,catheter intact. Accompanied by staff to an awaiting vehicle.

## 2018-06-23 NOTE — Consult Note (Signed)
DANYA SPEARMAN MRN: [id number] DOB/AGE: November 25, 1965 52 y.o. Primary Care Physician:Hawkins, Percell Miller, MD Admit date: 06/22/2018 Chief Complaint:  Chief Complaint  Patient presents with  . Urinary Tract Infection  . Emesis   HPI:Pt is 52 year old female with past medical hx of ESRD who was brought to ER with c/o Nausea and vomiting  HPI date back to yesterday when pt started having nausea, nonbloody vomiting, and polyuria.   Patient reported that she was in her usual state of health up till yesterday but then developed the aforementioned symptoms Pt denies any abdominal pain or diarrhea.   Upon evaluation in ER patient is found to be febrile to 38.4, Pt was admitted for observation for ongoing evaluation and management of fever with nausea, vomiting, and malaise in an ESRD patient with central venous catheter, recent skin grafting, and diabetic toe ulcer. Nephrology was consulted for need of dialysis  Pt offers no new complaints. Pt says " I am feeling good" NO c/o cough/ shortness of breath. No c.o rhinorrhea, or sore throat.   NO c/o tenderness or drainage from the dialysis catheter in her right chest.     Past Medical History:  Diagnosis Date  . Acid reflux    takes Tums  . Anemia   . Arthritis   . Bipolar 1 disorder ([place])   . Blindness of left eye   . Cervical radiculopathy   . CHF (congestive heart failure) ([place])   . Chronic kidney disease    Stage 5- 01/25/17  . COPD (chronic obstructive pulmonary disease) ([place]) 2014   bronchitis  . Degenerative disc disease, thoracic   . Depression   . Diabetes mellitus    Type II  . Diabetic retinopathy ([place])   . End stage kidney disease ([place])    M, W, F [facility]  . Hypertension   . Noncompliance with medication regimen   . Noncompliance with medication regimen   . Obesity (BMI 30-39.9)   . OSA (obstructive sleep apnea)    cpap  . Panic attack   . RLS (restless legs syndrome)         Family History  Adopted: Yes   Family history unknown: Yes    Social History:  reports that she quit smoking about 10 years ago. Her smoking use included cigarettes. She has a 37.50 pack-year smoking history. She has never used smokeless tobacco. She reports that she does not drink alcohol or use drugs.   Allergies:  Allergies  Allergen Reactions  . Tape Other (See Comments)    UNSPECIFIED REACTION  [Plastic Tape]  . Codeine Nausea And Vomiting    Medications Prior to Admission  Medication Sig Dispense Refill  . ALPRAZolam (XANAX) 1 MG tablet Take 1 mg by mouth 4 (four) times daily.     Marland Kitchen atorvastatin (LIPITOR) 20 MG tablet Take 20 mg by mouth daily.     . busPIRone (BUSPAR) 7.5 MG tablet Take 7.5 mg by mouth 2 (two) times daily.    . calcium carbonate (TUMS EX) 750 MG chewable tablet Chew 1-3 tablets by mouth See admin instructions. Patient takes 3 tablets with meals and 1 tablet with snacks three times a day    . colchicine 0.6 MG tablet Take 1 tablet (0.6 mg total) by mouth every other day. 16 tablet 12  . dicyclomine (BENTYL) 10 MG capsule Take 1 capsule (10 mg total) by mouth 4 (four) times daily -  before meals and at bedtime. (Patient taking differently: Take 10  mg by mouth daily. ) 120 capsule 3  . gabapentin (NEURONTIN) 300 MG capsule Take 300 mg by mouth 2 (two) times daily.    Marland Kitchen HYDROcodone-acetaminophen (NORCO) 10-325 MG tablet Take 1 tablet by mouth every 6 (six) hours as needed for moderate pain. (Patient taking differently: Take 1 tablet by mouth 4 (four) times daily. ) 6 tablet 0  . Insulin Glargine (LANTUS SOLOSTAR) 100 UNIT/ML Solostar Pen Inject 15 Units into the skin at bedtime. 15 mL 11  . lidocaine-prilocaine (EMLA) cream Apply 1 application topically as needed (topical anesthesia for hemodialysis if Gebauers and Lidocaine injection are ineffective.). 30 g 0  . oxyCODONE (ROXICODONE) 5 MG immediate release tablet Take 1 tablet (5 mg total) by mouth every 4 (four) hours as needed. (Patient not  taking: Reported on 06/22/2018) 12 tablet 0  . PARoxetine (PAXIL) 20 MG tablet Take 1 daily. This is to prevent panic attacks (Patient taking differently: Take 20-40 mg by mouth daily. This is to prevent panic attacks) 30 tablet 12  . promethazine (PHENERGAN) 6.25 MG/5ML syrup Take 20 mLs (25 mg total) by mouth at bedtime for 3 days. 120 mL 1  . torsemide (DEMADEX) 20 MG tablet Take 20 mg by mouth daily.         RJJ:OACZY from the symptoms mentioned above,there are no other symptoms referable to all systems reviewed.  . ALPRAZolam  1 mg Oral QID  . atorvastatin  20 mg Oral q1800  . busPIRone  7.5 mg Oral BID  . calcium carbonate  2 tablet Oral TID WC  . chlorhexidine  15 mL Mouth Rinse BID  . [START ON 06/24/2018] colchicine  0.6 mg Oral Q48H  . dicyclomine  10 mg Oral Daily  . gabapentin  300 mg Oral BID  . heparin  5,000 Units Subcutaneous Q8H  . insulin aspart  0-5 Units Subcutaneous QHS  . insulin aspart  0-9 Units Subcutaneous TID WC  . insulin glargine  7 Units Subcutaneous QHS  . mouth rinse  15 mL Mouth Rinse q12n4p  . PARoxetine  20 mg Oral Daily  . sodium chloride flush  3 mL Intravenous Q12H       Physical Exam: Vital signs in last 24 hours: Temp:  [99.2 F (37.3 C)-101.2 F (38.4 C)] 99.2 F (37.3 C) (11/27 0315) Pulse Rate:  [81-96] 81 (11/27 0315) Resp:  [18-21] 21 (11/27 0315) BP: (144-151)/(65-100) 144/65 (11/27 0315) SpO2:  [93 %-98 %] 97 % (11/27 0315) Weight:  [154.7 kg] 154.7 kg (11/26 2119) Weight change:  Last BM Date: 06/22/18  Intake/Output from previous day: 11/26 0701 - 11/27 0700 In: 600 [IV Piggyback:600] Out: -  Total I/O In: 240 [P.O.:240] Out: -    Physical Exam: General- pt is awake,alert, follows commands Resp- No acute REsp distress, CTA B/L NO Rhonchi, . CVS- S1S2 regular in rate and rhythm GIT- BS+, soft, NT, ND,obese EXT- NO LE Edema, Cyanosis CNS- CN 2-12 grossly intact. Moving all 4 extremities Psych- normal mood and  affect Access-PC   Lab Results: CBC Recent Labs    06/22/18 2209  WBC 9.5  HGB 11.2*  HCT 35.4*  PLT 150    BMET Recent Labs    06/22/18 2209 06/23/18 0015  NA 140 137  K 3.8 3.8  CL 102 100  CO2 24 24  GLUCOSE 298* 281*  BUN 48* 45*  CREATININE 5.53* 5.69*  CALCIUM 9.8 9.3    MICRO Recent Results (from the past 240 hour(s))  Blood  culture (routine x 2)     Status: None (Preliminary result)   Collection Time: 06/23/18 12:07 AM  Result Value Ref Range Status   Specimen Description BLOOD LEFT ARM  Final   Special Requests AEROBIC BOTTLE ONLY Blood Culture adequate volume  Final   Culture   Final    NO GROWTH < 12 HOURS Performed at [facility], [address], [place], [place] [address]    Report Status PENDING  Incomplete  Blood culture (routine x 2)     Status: None (Preliminary result)   Collection Time: 06/23/18 12:15 AM  Result Value Ref Range Status   Specimen Description BLOOD LEFT ARM  Final   Special Requests   Final    BOTTLES DRAWN AEROBIC AND ANAEROBIC Blood Culture adequate volume   Culture   Final    NO GROWTH < 12 HOURS Performed at [facility], [address], [place], [place] [address]    Report Status PENDING  Incomplete  MRSA PCR Screening     Status: None   Collection Time: 06/23/18  3:24 AM  Result Value Ref Range Status   MRSA by PCR NEGATIVE NEGATIVE Final    Comment:        The GeneXpert MRSA Assay (FDA approved for NASAL specimens only), is one component of a comprehensive MRSA colonization surveillance program. It is not intended to diagnose MRSA infection nor to guide or monitor treatment for MRSA infections. Performed at [facility], [address], [place], [place] [address]       Lab Results  Component Value Date   PTH 103 (H) 04/10/2017   PTH Comment 04/10/2017   CALCIUM 9.3 06/23/2018   CAION 1.20 12/13/2017   PHOS 4.4 02/27/2018      Impression: 1)Renal  ESRD on HD                Pt is on  Monday/Wednesday/Friday schedule                Pt will be dialyzed today  2)HTN  Medication-  On Alpha and beta Blockers. On Vasodilators- Hydralzine    3)Anemia In ESRD the goal for HGb is 9--11( on ESA) Anemia of chronic disease Will keep on low dose of ESA  4)CKD Mineral-Bone Disorder Secondary Hyperparathyroidism  present  Phosphorus at goal. Calcium is at goal.  5)GI-admitted with Nausea and Vomiting Clinically better Primary MD following  6)Electrolytes Normokalemic NOrmonatremic   7)Acid base Co2 at goal     Plan:   Will dialyze today Will give low dose epo  Addendum Pt seen on HD Pt tolerating tx well.    Kamala Kolton S 06/23/2018, 12:49 PM

## 2018-06-23 NOTE — ED Notes (Signed)
Patient refused rectal temperature 

## 2018-06-23 NOTE — ED Notes (Signed)
Patient had 3 cups of diet ginger-ale with no vomiting.

## 2018-06-23 NOTE — ED Provider Notes (Signed)
Care assumed from Dr. Lita Mains.  ESRD patient on dialysis presenting with 1 day history of urinary frequency, urgency, nausea and vomiting.  She is concerned for UTI.  Awaiting urinalysis.  Abdomen is soft and nontender.  Patient states she feels better after receiving fluids and antipyretics in the ED.  Did spike temperature to 101.2.  Added lactate and blood cultures.  Lactate normal.  Urinalysis not obviously infected.  Has squamous cells and rare bacteria.  We will send culture.  Will obtain chest x-ray and flu swab to further evaluate source of fever.  Dialysis catheter appears to be clean  Per chart review, patient underwent skin graft from her abdominal wall to her left eye socket on October 9.  The skin graft site appears to be healing well but she continues to have purulent discharge from her left eye socket which has been ongoing for several months.  She was told by her ophthalmologist that this should have stopped after 1 week.  Cultures of the drainage at the time of her surgery grew Serratia which patient states she took antibiotics for. Patient has a second wound to her abdomen that is healing just above her umbilicus that she states was an ulcer.  There is no fluctuance.  No purulent drainage from left eye socket today.  Abdominal wounds appear to be healing appropriately. Unclear source of fever.  Will obtain chest x-ray and flu swab.  With systemic inflammatory response syndrome and dialysis patient with central catheter, will plan observation admission.  Started on IV Rocephin.  Discussed with Dr. Myna Hidalgo.   Ezequiel Essex, MD 06/23/18 [phone]

## 2018-06-24 LAB — URINE CULTURE

## 2018-06-28 LAB — CULTURE, BLOOD (ROUTINE X 2)
CULTURE: NO GROWTH
Culture: NO GROWTH
SPECIAL REQUESTS: ADEQUATE
Special Requests: ADEQUATE

## 2018-06-29 ENCOUNTER — Ambulatory Visit: Payer: Medicaid Other | Admitting: Endocrinology

## 2018-06-29 VITALS — BP 158/102 | HR 66 | Ht 67.0 in | Wt 348.0 lb

## 2018-06-29 DIAGNOSIS — E1165 Type 2 diabetes mellitus with hyperglycemia: Secondary | ICD-10-CM | POA: Diagnosis not present

## 2018-06-29 DIAGNOSIS — Z794 Long term (current) use of insulin: Secondary | ICD-10-CM

## 2018-06-29 LAB — POCT GLYCOSYLATED HEMOGLOBIN (HGB A1C): HEMOGLOBIN A1C: 8.8 % — AB (ref 4.0–5.6)

## 2018-06-29 MED ORDER — INSULIN GLARGINE 100 UNIT/ML SOLOSTAR PEN: 30.0000 [IU] | PEN_INJECTOR | SUBCUTANEOUS | 11 refills | Status: DC

## 2018-06-29 NOTE — Progress Notes (Signed)
Subjective:    Patient ID: Martha [name], female    DOB: July 27, 1966, 52 y.o.   MRN: [id number]  HPI pt is referred by Dr Luan Pulling, for diabetes.  Pt states DM was dx'ed in 2004; she has severe neuropathy of the lower extremities, and assoc pain; she has associated ESRD, PDR, and foot ulcer; she has been on insulin since 2009; pt says her diet and exercise are poor; she has never had GDM, pancreatitis, pancreatic surgery, severe hypoglycemia or DKA.  She takes lantus, 0-40 units/day (averages 20 units/day).  She says cbg's vary from 250-450.  There is no trend throughout the day.   Past Medical History:  Diagnosis Date  . Acid reflux    takes Tums  . Anemia   . Arthritis   . Bipolar 1 disorder ([place])   . Blindness of left eye   . Cervical radiculopathy   . CHF (congestive heart failure) ([place])   . Chronic kidney disease    Stage 5- 01/25/17  . COPD (chronic obstructive pulmonary disease) ([place]) 2014   bronchitis  . Degenerative disc disease, thoracic   . Depression   . Diabetes mellitus    Type II  . Diabetic retinopathy ([place])   . End stage kidney disease ([place])    M, W, [facility]  . Hypertension   . Noncompliance with medication regimen   . Noncompliance with medication regimen   . Obesity (BMI 30-39.9)   . OSA (obstructive sleep apnea)    cpap  . Panic attack   . RLS (restless legs syndrome)     Past Surgical History:  Procedure Laterality Date  . AV FISTULA PLACEMENT Right 01/27/2017   Procedure: ARTERIOVENOUS (AV) FISTULA CREATION-RIGHT ARM;  Surgeon: Elam Dutch, MD;  Location: [facility];  Service: Vascular;  Laterality: Right;  . BIOPSY N/A 05/17/2013   Procedure: BIOPSY;  Surgeon: [name], MD;  Location: [facility];  Service: Endoscopy;  Laterality: N/A;  . CHOLECYSTECTOMY N/A 04/27/2014   Procedure: LAPAROSCOPIC CHOLECYSTECTOMY;  Surgeon: [name], MD;  Location: [facility];  Service: General;  Laterality: N/A;  . COLONOSCOPY  06/06/2010   TML:YYTKPT  bleeding secondary to internal hemorrhoids but incomplete evaluation secondary to poor right colon prep/small rectal and sigmoid colon polyps (hyperplastic). PROPOFOL  . COLONOSCOPY  May 2013   Dr. Gilliam/[facility]: 5 mm a descending colon polyp, hyperplastic. Adequate bowel prep.  . ENDOMETRIAL ABLATION    . 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 ENUCLEATION Left 02/04/2018   Procedure: ENUCLEATION WITH PLACEMENT OF IMPLANT LEFT EYE;  Surgeon: Clista Bernhardt, MD;  Location: [facility];  Service: Ophthalmology;  Laterality: Left;  . ESOPHAGOGASTRODUODENOSCOPY  06/06/2010   WSF:KCLEXNT erythema and edema of body of stomach, with sessile polypoid lesions. bx benign. no h.pylori  . ESOPHAGOGASTRODUODENOSCOPY (EGD) WITH PROPOFOL N/A 05/17/2013   Dr. Oneida Alar: normal esophagus, moderate nodular gastritis, negative path, empiric Savary dilation  . EYE SURGERY  07/2017   sx for glaucoma  . FLEXIBLE SIGMOIDOSCOPY N/A 05/17/2013   Dr. Oneida Alar: moderate sized internal hemorrhoids  . HEMORRHOID BANDING N/A 05/17/2013   Procedure: HEMORRHOID BANDING;  Surgeon: [name], MD;  Location[facility];  Service: Endoscopy;  Laterality: N/A;  2 bands placed  . INCISION AND DRAINAGE ABSCESS Left 10/11/2013   Procedure: INCISION AND DRAINAGE AND DEBRIDEMENT LEFT BREAST  ABSCESS;  Surgeon: [name], MD;  Location: [facility];  Service: General;  Laterality: Left;  . INSERTION OF DIALYSIS CATHETER N/A 06/08/2018   Procedure: INSERTION OF Right internal jugular TUNNELED  DIALYSIS CATHETER;  Surgeon: Angelia Mould, MD;  Location: [facility];  Service: Vascular;  Laterality: N/A;  . IRRIGATION AND DEBRIDEMENT ABSCESS Right 06/01/2013   Procedure: INCISION AND DRAINAGE AND DEBRIDEMENT ABSCESS RIGHT BREAST;  Surgeon: [name], MD;  Location: [facility];  Service: General;  Laterality: Right;  . LEFT EYE REMOVED Left 01/2018   [facility] [address].  Marland Kitchen  LIGATION OF ARTERIOVENOUS  FISTULA Right 06/08/2018   Procedure: LIGATION OF ARTERIOVENOUS  FISTULA RIGHT ARM;  Surgeon: Angelia Mould, MD;  Location: [facility];  Service: Vascular;  Laterality: Right;  . SAVORY DILATION N/A 05/17/2013   Procedure: SAVORY DILATION;  Surgeon: [name], MD;  Location: [facility];  Service: Endoscopy;  Laterality: N/A;  14/15/16  . SKIN FULL THICKNESS GRAFT Left 05/05/2018   Procedure: ABDOMINAL DERMIS FAT SKIN GRAFT FULL THICKNESS LEFT EYE;  Surgeon: Clista Bernhardt, MD;  Location: [facility];  Service: Ophthalmology;  Laterality: Left;  . TUBAL LIGATION      Social History   Socioeconomic History  . Marital status: Divorced    Spouse name: Not on file  . Number of children: 2  . Years of education: Not on file  . Highest education level: Not on file  Occupational History  . Occupation: Disabled  Social Needs  . Financial resource strain: Not on file  . Food insecurity:    Worry: Not on file    Inability: Not on file  . Transportation needs:    Medical: Not on file    Non-medical: Not on file  Tobacco Use  . Smoking status: Former Smoker    Packs/day: 1.50    Years: 25.00    Pack years: 37.50    Types: Cigarettes    Last attempt to quit: 10/23/2007    Years since quitting: 10.6  . Smokeless tobacco: Never Used  Substance and Sexual Activity  . Alcohol use: No  . Drug use: No  . Sexual activity: Yes    Birth control/protection: Surgical  Lifestyle  . Physical activity:    Days per week: Not on file    Minutes per session: Not on file  . Stress: Not on file  Relationships  . Social connections:    Talks on phone: Not on file    Gets together: Not on file    Attends religious service: Not on file    Active member of club or organization: Not on file    Attends meetings of clubs or organizations: Not on file    Relationship status: Not on file  . Intimate partner violence:    Fear of current or ex partner: Not on file     Emotionally abused: Not on file    Physically abused: Not on file    Forced sexual activity: Not on file  Other Topics Concern  . Not on file  Social History Narrative   Lives with fiance.      Current Outpatient Medications on File Prior to Visit  Medication Sig Dispense Refill  . albuterol (PROVENTIL) (2.5 MG/3ML) 0.083% nebulizer solution Take 2.5 mg by nebulization 4 (four) times daily.    Marland Kitchen ALPRAZolam (XANAX) 1 MG tablet Take 1 mg by mouth 4 (four) times daily.     Marland Kitchen atorvastatin (LIPITOR) 20 MG tablet Take 20 mg by mouth daily.     . busPIRone (  BUSPAR) 7.5 MG tablet Take 7.5 mg by mouth 2 (two) times daily.    . calcium carbonate (TUMS EX) 750 MG chewable tablet Chew 1-3 tablets by mouth See admin instructions. Patient takes 3 tablets with meals and 1 tablet with snacks three times a day    . cefUROXime (CEFTIN) 250 MG tablet Take 2 tablets after each dialysis x5 occasions 10 tablet 0  . colchicine 0.6 MG tablet Take 1 tablet (0.6 mg total) by mouth every other day. (Patient taking differently: Take 0.6 mg by mouth every other day. *TAKE ON NON-DIALYSIS DAYS ONLY) 16 tablet 12  . dicyclomine (BENTYL) 10 MG capsule Take 1 capsule (10 mg total) by mouth 4 (four) times daily -  before meals and at bedtime. (Patient taking differently: Take 10 mg by mouth daily. ) 120 capsule 3  . gabapentin (NEURONTIN) 300 MG capsule Take 300 mg by mouth at bedtime.     Marland Kitchen HYDROcodone-acetaminophen (NORCO) 10-325 MG tablet Take 1 tablet by mouth every 6 (six) hours as needed for moderate pain. (Patient taking differently: Take 1 tablet by mouth 4 (four) times daily. ) 6 tablet 0  . lidocaine-prilocaine (EMLA) cream Apply 1 application topically as needed (topical anesthesia for hemodialysis if Gebauers and Lidocaine injection are ineffective.). 30 g 0  . losartan (COZAAR) 25 MG tablet Take 25 mg by mouth daily.    Marland Kitchen PARoxetine (PAXIL) 20 MG tablet Take 1 daily. This is to prevent panic attacks (Patient  taking differently: Take 20-40 mg by mouth daily. This is to prevent panic attacks) 30 tablet 12  . torsemide (DEMADEX) 100 MG tablet Take 100 mg by mouth See admin instructions. TAKE ONE TABLET BY MOUTH ON NON DIALYSIS DAYS ONLY-     No current facility-administered medications on file prior to visit.     Allergies  Allergen Reactions  . Tape Other (See Comments)    UNSPECIFIED REACTION  [Plastic Tape]  . Codeine Nausea And Vomiting    Family History  Adopted: Yes  Family history unknown: Yes    BP (!) 158/102 (BP Location: Left Arm, Patient Position: Sitting, Cuff Size: Large)   Pulse 66   Ht 5\' 7"  (1.702 m)   Wt (!) 348 lb (157.9 kg)   BMI 54.50 kg/m    Review of Systems denies headache, chest pain, excessive diaphoresis, memory loss, cold intolerance, rhinorrhea, and easy bruising.  She has chronically poor vision. She has weight gain, nausea, urinary frequency, leg cramps, depression, and chronic dyspnea.       Objective:   Physical Exam VS: see vs page GEN: no distress.  In wheelchair.   HEAD: head: no deformity eyes: no periorbital swelling, no proptosis.  Left eye is absent.   external nose and ears are normal mouth: no lesion seen NECK: supple, thyroid is not enlarged CHEST WALL: no deformity LUNGS: clear to auscultation CV: reg rate and rhythm, no murmur ABD: abdomen is soft, nontender.  no hepatosplenomegaly.  not distended.  no hernia MUSCULOSKELETAL: muscle bulk and strength are grossly normal.  no obvious joint swelling.  gait is normal and steady EXTEMITIES: no deformity.  no ulcer on the feet.  feet are of normal color and temp.  no edema PULSES: dorsalis pedis intact bilat.  no carotid bruit NEURO:  cn 2-12 grossly intact.   readily moves all 4's.  sensation is intact to touch on the feet, but decreased from normal SKIN:  Normal texture and temperature.  No rash or suspicious lesion  is visible.  bandaid on the left great toe (see wound care) NODES:   None palpable at the neck PSYCH: alert, well-oriented.  Does not appear anxious nor depressed.   Lab Results  Component Value Date   HGBA1C 8.8 (A) 06/29/2018    I have reviewed outside records, and summarized: Pt was noted to have elevated a1c, and referred here.  She was noted to have noncompliance with dialysis.       Assessment & Plan:  Insulin-requiring type 2 DM, with ESRD: she needs increased rx.  Noncompliance, new to me: she is not a candidate for multiple daily injections.   Patient Instructions  Your blood pressure is high today.  Please follow this up at dialysis.   good diet and exercise significantly improve the control of your diabetes.  please let me know if you wish to be referred to a dietician.  high blood sugar is very risky to your health.  you should see an eye doctor and dentist every year.  It is very important to get all recommended vaccinations.  Controlling your blood pressure and cholesterol drastically reduces the damage diabetes does to your body.  Those who smoke should quit.  Please discuss these with your doctor.  check your blood sugar 4 times a day.  vary the time of day when you check, between before the 3 meals, and at bedtime.  also check if you have symptoms of your blood sugar being too high or too low.  please keep a record of the readings and bring it to your next appointment here (or you can bring the meter itself).  You can write it on any piece of paper.  please call us sooner if your blood sugar goes below 70, or if you have a lot of readings over 200. Please take lantus, 30 units each morning Please call or message [place] next week, to tell us how the blood sugar is doing.   Please come back for a follow-up appointment in 2-3 weeks.

## 2018-06-29 NOTE — Patient Instructions (Addendum)
Your blood pressure is high today.  Please follow this up at dialysis.   good diet and exercise significantly improve the control of your diabetes.  please let me know if you wish to be referred to a dietician.  high blood sugar is very risky to your health.  you should see an eye doctor and dentist every year.  It is very important to get all recommended vaccinations.  Controlling your blood pressure and cholesterol drastically reduces the damage diabetes does to your body.  Those who smoke should quit.  Please discuss these with your doctor.  check your blood sugar 4 times a day.  vary the time of day when you check, between before the 3 meals, and at bedtime.  also check if you have symptoms of your blood sugar being too high or too low.  please keep a record of the readings and bring it to your next appointment here (or you can bring the meter itself).  You can write it on any piece of paper.  please call us sooner if your blood sugar goes below 70, or if you have a lot of readings over 200. Please take lantus, 30 units each morning Please call or message [place] next week, to tell us how the blood sugar is doing.   Please come back for a follow-up appointment in 2-3 weeks.

## 2018-07-01 ENCOUNTER — Other Ambulatory Visit: Payer: Self-pay

## 2018-07-01 DIAGNOSIS — Z992 Dependence on renal dialysis: Principal | ICD-10-CM

## 2018-07-01 DIAGNOSIS — N186 End stage renal disease: Secondary | ICD-10-CM

## 2018-07-22 ENCOUNTER — Ambulatory Visit (HOSPITAL_COMMUNITY)
Admission: RE | Admit: 2018-07-22 | Discharge: 2018-07-22 | Disposition: A | Discharge status: Home / Self Care | Payer: Medicaid Other | Source: Ambulatory Visit | Attending: Surgery | Admitting: Surgery

## 2018-07-22 ENCOUNTER — Encounter: Payer: Self-pay | Admitting: Vascular Surgery

## 2018-07-22 ENCOUNTER — Encounter (HOSPITAL_COMMUNITY): Payer: Medicaid Other

## 2018-07-22 ENCOUNTER — Other Ambulatory Visit (HOSPITAL_COMMUNITY): Payer: Medicaid Other

## 2018-07-22 ENCOUNTER — Other Ambulatory Visit: Payer: Self-pay | Admitting: Vascular Surgery

## 2018-07-22 ENCOUNTER — Other Ambulatory Visit: Payer: Self-pay

## 2018-07-22 ENCOUNTER — Ambulatory Visit (INDEPENDENT_AMBULATORY_CARE_PROVIDER_SITE_OTHER): Payer: Self-pay | Admitting: Surgery

## 2018-07-22 ENCOUNTER — Ambulatory Visit (INDEPENDENT_AMBULATORY_CARE_PROVIDER_SITE_OTHER)
Admission: RE | Admit: 2018-07-22 | Discharge: 2018-07-22 | Disposition: A | Discharge status: Home / Self Care | Payer: Medicaid Other | Source: Ambulatory Visit | Attending: Surgery | Admitting: Surgery

## 2018-07-22 ENCOUNTER — Encounter: Payer: Self-pay | Admitting: Surgery

## 2018-07-22 VITALS — BP 148/90 | HR 62 | Temp 97.3°F | Resp 20 | Ht 67.0 in | Wt 348.0 lb

## 2018-07-22 DIAGNOSIS — Z992 Dependence on renal dialysis: Secondary | ICD-10-CM

## 2018-07-22 DIAGNOSIS — N186 End stage renal disease: Secondary | ICD-10-CM

## 2018-07-22 NOTE — H&P (View-Only) (Signed)
Patient name: Martha [name] MRN: [id number] DOB: [birth year] Sex: female  REASON FOR VISIT:     post op  HISTORY OF PRESENT ILLNESS:   Martha [name] is a 52 y.o. female who returns today for follow-up.  She recently underwent ligation of a right arm fistula secondary to steal syndrome as well as placement of a catheter.  She states that her right arm is marginally improved after fistula ligation.  She absolutely hates her catheter because she cannot shower.  She uses her left hand and right hand equally.  CURRENT MEDICATIONS:    Current Outpatient Medications  Medication Sig Dispense Refill  . albuterol (PROVENTIL) (2.5 MG/3ML) 0.083% nebulizer solution Take 2.5 mg by nebulization 4 (four) times daily.    Marland Kitchen ALPRAZolam (XANAX) 1 MG tablet Take 1 mg by mouth 4 (four) times daily.     Marland Kitchen atorvastatin (LIPITOR) 20 MG tablet Take 20 mg by mouth daily.     . busPIRone (BUSPAR) 7.5 MG tablet Take 7.5 mg by mouth 2 (two) times daily.    . calcium carbonate (TUMS EX) 750 MG chewable tablet Chew 1-3 tablets by mouth See admin instructions. Patient takes 3 tablets with meals and 1 tablet with snacks three times a day    . cefUROXime (CEFTIN) 250 MG tablet Take 2 tablets after each dialysis x5 occasions 10 tablet 0  . colchicine 0.6 MG tablet Take 1 tablet (0.6 mg total) by mouth every other day. (Patient taking differently: Take 0.6 mg by mouth every other day. *TAKE ON NON-DIALYSIS DAYS ONLY) 16 tablet 12  . dicyclomine (BENTYL) 10 MG capsule Take 1 capsule (10 mg total) by mouth 4 (four) times daily -  before meals and at bedtime. (Patient taking differently: Take 10 mg by mouth daily. ) 120 capsule 3  . gabapentin (NEURONTIN) 300 MG capsule Take 300 mg by mouth at bedtime.     Marland Kitchen HYDROcodone-acetaminophen (NORCO) 10-325 MG tablet Take 1 tablet by mouth every 6 (six) hours as needed for moderate pain. (Patient taking differently: Take 1 tablet by mouth 4 (four) times  daily. ) 6 tablet 0  . Insulin Glargine (LANTUS SOLOSTAR) 100 UNIT/ML Solostar Pen Inject 30 Units into the skin every morning. And pen needles 1/day 15 mL 11  . lidocaine-prilocaine (EMLA) cream Apply 1 application topically as needed (topical anesthesia for hemodialysis if Gebauers and Lidocaine injection are ineffective.). 30 g 0  . losartan (COZAAR) 25 MG tablet Take 25 mg by mouth daily.    Marland Kitchen PARoxetine (PAXIL) 20 MG tablet Take 1 daily. This is to prevent panic attacks (Patient taking differently: Take 20-40 mg by mouth daily. This is to prevent panic attacks) 30 tablet 12  . torsemide (DEMADEX) 100 MG tablet Take 100 mg by mouth See admin instructions. TAKE ONE TABLET BY MOUTH ON NON DIALYSIS DAYS ONLY-     No current facility-administered medications for this visit.     REVIEW OF SYSTEMS:   [X]  denotes positive finding, [ ]  denotes negative finding Cardiac  Comments:  Chest pain or chest pressure:    Shortness of breath upon exertion:    Short of breath when lying flat:    Irregular heart rhythm:    Constitutional    Fever or chills:      PHYSICAL EXAM:   Vitals:   07/22/18 1203  BP: (!) 148/90  Pulse: 62  Resp: 20  Temp: (!) 97.3 F (36.3 C)  SpO2: 98%  Weight: (!) 348 lb (  157.9 kg)  Height: 5\' 7"  (1.702 m)    GENERAL: The patient is a well-nourished female, in no acute distress. The vital signs are documented above. CARDIOVASCULAR: There is a regular rate and rhythm. PULMONARY: Non-labored respirations Palpable left brachial pulse  STUDIES:   Right Basilic    Diameter (cm)Depth (cm)   Findings    +-----------------+-------------+----------+--------------+ Mid upper arm     0.54 / .35                           +-----------------+-------------+----------+--------------+ Dist upper arm       0.48                              +-----------------+-------------+----------+--------------+ Antecubital fossa    0.51                               +-----------------+-------------+----------+--------------+ Prox forearm                            not visualized +-----------------+-------------+----------+--------------+  +-----------------+-------------+----------+---------+ Left Cephalic    Diameter (cm)Depth (cm)Findings  +-----------------+-------------+----------+---------+ Shoulder             0.28                         +-----------------+-------------+----------+---------+ Prox upper arm       0.30                         +-----------------+-------------+----------+---------+ Mid upper arm        0.25                         +-----------------+-------------+----------+---------+ Dist upper arm       0.37                         +-----------------+-------------+----------+---------+ Antecubital fossa    0.17               branching +-----------------+-------------+----------+---------+ Prox forearm         0.30                         +-----------------+-------------+----------+---------+ Mid forearm          0.17                         +-----------------+-------------+----------+---------+ Dist forearm         0.84                         +-----------------+-------------+----------+---------+  +-----------------+-------------+----------+--------------+ Left Basilic     Diameter (cm)Depth (cm)   Findings    +-----------------+-------------+----------+--------------+ Shoulder                                not visualized +-----------------+-------------+----------+--------------+ Prox upper arm                          not visualized +-----------------+-------------+----------+--------------+ Mid upper arm        0.35  joins      +-----------------+-------------+----------+--------------+ Dist upper arm       0.23                              +-----------------+-------------+----------+--------------+ Antecubital fossa     0.35                              +-----------------+-------------+----------+--------------+ Prox forearm      0.27 / 0.30                          +-----------------+-------------+----------+--------------+    MEDICAL ISSUES:   The patient wants to get her catheter out as soon as possible.  I have reviewed her vein mapping and feel that she will be a candidate for a left arm fistula versus graft.  I discussed that that decision will need to be made in the operating room.  If a fistula is created, there is a reasonable probability of her requiring a second stage procedure.  We also discussed that she may require a graft if her veins turn out to not be adequate.  She understands this.  She understands she is at risk for steal in the future.  She just wants to get this done soon as possible.  For that reason I am in the schedule her for next week on a nondialysis day.  Annamarie Major, MD [facility] [phone] Pager [phone]

## 2018-07-22 NOTE — Progress Notes (Signed)
Patient name: Martha [name] MRN: [id number] DOB: 1966-04-02 Sex: female  REASON FOR VISIT:     post op  HISTORY OF PRESENT ILLNESS:   Martha [name] is a 52 y.o. female who returns today for follow-up.  She recently underwent ligation of a right arm fistula secondary to steal syndrome as well as placement of a catheter.  She states that her right arm is marginally improved after fistula ligation.  She absolutely hates her catheter because she cannot shower.  She uses her left hand and right hand equally.  CURRENT MEDICATIONS:    Current Outpatient Medications  Medication Sig Dispense Refill  . albuterol (PROVENTIL) (2.5 MG/3ML) 0.083% nebulizer solution Take 2.5 mg by nebulization 4 (four) times daily.    Marland Kitchen ALPRAZolam (XANAX) 1 MG tablet Take 1 mg by mouth 4 (four) times daily.     Marland Kitchen atorvastatin (LIPITOR) 20 MG tablet Take 20 mg by mouth daily.     . busPIRone (BUSPAR) 7.5 MG tablet Take 7.5 mg by mouth 2 (two) times daily.    . calcium carbonate (TUMS EX) 750 MG chewable tablet Chew 1-3 tablets by mouth See admin instructions. Patient takes 3 tablets with meals and 1 tablet with snacks three times a day    . cefUROXime (CEFTIN) 250 MG tablet Take 2 tablets after each dialysis x5 occasions 10 tablet 0  . colchicine 0.6 MG tablet Take 1 tablet (0.6 mg total) by mouth every other day. (Patient taking differently: Take 0.6 mg by mouth every other day. *TAKE ON NON-DIALYSIS DAYS ONLY) 16 tablet 12  . dicyclomine (BENTYL) 10 MG capsule Take 1 capsule (10 mg total) by mouth 4 (four) times daily -  before meals and at bedtime. (Patient taking differently: Take 10 mg by mouth daily. ) 120 capsule 3  . gabapentin (NEURONTIN) 300 MG capsule Take 300 mg by mouth at bedtime.     Marland Kitchen HYDROcodone-acetaminophen (NORCO) 10-325 MG tablet Take 1 tablet by mouth every 6 (six) hours as needed for moderate pain. (Patient taking differently: Take 1 tablet by mouth 4 (four) times  daily. ) 6 tablet 0  . Insulin Glargine (LANTUS SOLOSTAR) 100 UNIT/ML Solostar Pen Inject 30 Units into the skin every morning. And pen needles 1/day 15 mL 11  . lidocaine-prilocaine (EMLA) cream Apply 1 application topically as needed (topical anesthesia for hemodialysis if Gebauers and Lidocaine injection are ineffective.). 30 g 0  . losartan (COZAAR) 25 MG tablet Take 25 mg by mouth daily.    Marland Kitchen PARoxetine (PAXIL) 20 MG tablet Take 1 daily. This is to prevent panic attacks (Patient taking differently: Take 20-40 mg by mouth daily. This is to prevent panic attacks) 30 tablet 12  . torsemide (DEMADEX) 100 MG tablet Take 100 mg by mouth See admin instructions. TAKE ONE TABLET BY MOUTH ON NON DIALYSIS DAYS ONLY-     No current facility-administered medications for this visit.     REVIEW OF SYSTEMS:   [X]  denotes positive finding, [ ]  denotes negative finding Cardiac  Comments:  Chest pain or chest pressure:    Shortness of breath upon exertion:    Short of breath when lying flat:    Irregular heart rhythm:    Constitutional    Fever or chills:      PHYSICAL EXAM:   Vitals:   07/22/18 1203  BP: (!) 148/90  Pulse: 62  Resp: 20  Temp: (!) 97.3 F (36.3 C)  SpO2: 98%  Weight: (!) 348 lb (  157.9 kg)  Height: 5\' 7"  (1.702 m)    GENERAL: The patient is a well-nourished female, in no acute distress. The vital signs are documented above. CARDIOVASCULAR: There is a regular rate and rhythm. PULMONARY: Non-labored respirations Palpable left brachial pulse  STUDIES:   Right Basilic    Diameter (cm)Depth (cm)   Findings    +-----------------+-------------+----------+--------------+ Mid upper arm     0.54 / .35                           +-----------------+-------------+----------+--------------+ Dist upper arm       0.48                              +-----------------+-------------+----------+--------------+ Antecubital fossa    0.51                               +-----------------+-------------+----------+--------------+ Prox forearm                            not visualized +-----------------+-------------+----------+--------------+  +-----------------+-------------+----------+---------+ Left Cephalic    Diameter (cm)Depth (cm)Findings  +-----------------+-------------+----------+---------+ Shoulder             0.28                         +-----------------+-------------+----------+---------+ Prox upper arm       0.30                         +-----------------+-------------+----------+---------+ Mid upper arm        0.25                         +-----------------+-------------+----------+---------+ Dist upper arm       0.37                         +-----------------+-------------+----------+---------+ Antecubital fossa    0.17               branching +-----------------+-------------+----------+---------+ Prox forearm         0.30                         +-----------------+-------------+----------+---------+ Mid forearm          0.17                         +-----------------+-------------+----------+---------+ Dist forearm         0.84                         +-----------------+-------------+----------+---------+  +-----------------+-------------+----------+--------------+ Left Basilic     Diameter (cm)Depth (cm)   Findings    +-----------------+-------------+----------+--------------+ Shoulder                                not visualized +-----------------+-------------+----------+--------------+ Prox upper arm                          not visualized +-----------------+-------------+----------+--------------+ Mid upper arm        0.35  joins      +-----------------+-------------+----------+--------------+ Dist upper arm       0.23                              +-----------------+-------------+----------+--------------+ Antecubital fossa     0.35                              +-----------------+-------------+----------+--------------+ Prox forearm      0.27 / 0.30                          +-----------------+-------------+----------+--------------+    MEDICAL ISSUES:   The patient wants to get her catheter out as soon as possible.  I have reviewed her vein mapping and feel that she will be a candidate for a left arm fistula versus graft.  I discussed that that decision will need to be made in the operating room.  If a fistula is created, there is a reasonable probability of her requiring a second stage procedure.  We also discussed that she may require a graft if her veins turn out to not be adequate.  She understands this.  She understands she is at risk for steal in the future.  She just wants to get this done soon as possible.  For that reason I am in the schedule her for next week on a nondialysis day.  Annamarie Major, MD [facility] [phone] Pager [phone]

## 2018-07-23 ENCOUNTER — Encounter (HOSPITAL_COMMUNITY): Payer: Self-pay | Admitting: *Deleted

## 2018-07-23 ENCOUNTER — Other Ambulatory Visit: Payer: Self-pay

## 2018-07-23 NOTE — Progress Notes (Addendum)
Martha [name] denies chest pain. No shortness of breath- at rest, does have some shortness of breath while walking.   Patient's PCP is Dr Velvet Bathe. Martha [name] dialysis at  [facility] in [place]. Martha [name] reports that CBGs have been running over 200. We discussed checking CBG  more frequently and see if something she is doing or eating is causing high CBGs.  I instructed patient hat if CBG is greater than 70 on Monday to take 15 units Lantus (1/2 of schedueld dose). I instructed patient to check CBG after awaking and every 2 hours until arrival  to the hospital.  I Instructed patient if CBG is less than 70 to drink  1/2 cup of a clear juice. Recheck CBG in 15 minutes then call pre- op desk at [phone] for further instructions. If scheduled to receive Insulin, do not take Insulin.  Patients medications are prepackaged- except for controlled medications.  Patient will not take any scheduled medications the morning of surgery. If needed patient will take Xanax and Vicodin.  Patient reports that she no longer has an Albuterol inhaler. Marland Kitchen

## 2018-07-26 ENCOUNTER — Other Ambulatory Visit: Payer: Self-pay

## 2018-07-26 ENCOUNTER — Emergency Department (HOSPITAL_COMMUNITY)
Admission: EM | Admit: 2018-07-26 | Discharge: 2018-07-26 | Disposition: A | Discharge status: Home / Self Care | Payer: Medicaid Other | Attending: Emergency Medicine | Admitting: Emergency Medicine

## 2018-07-26 ENCOUNTER — Encounter (HOSPITAL_COMMUNITY): Payer: Self-pay

## 2018-07-26 ENCOUNTER — Emergency Department (HOSPITAL_COMMUNITY): Payer: Medicaid Other

## 2018-07-26 DIAGNOSIS — I132 Hypertensive heart and chronic kidney disease with heart failure and with stage 5 chronic kidney disease, or end stage renal disease: Secondary | ICD-10-CM | POA: Insufficient documentation

## 2018-07-26 DIAGNOSIS — N186 End stage renal disease: Secondary | ICD-10-CM | POA: Diagnosis not present

## 2018-07-26 DIAGNOSIS — E119 Type 2 diabetes mellitus without complications: Secondary | ICD-10-CM | POA: Diagnosis not present

## 2018-07-26 DIAGNOSIS — Z87891 Personal history of nicotine dependence: Secondary | ICD-10-CM | POA: Insufficient documentation

## 2018-07-26 DIAGNOSIS — J449 Chronic obstructive pulmonary disease, unspecified: Secondary | ICD-10-CM | POA: Diagnosis not present

## 2018-07-26 DIAGNOSIS — Z794 Long term (current) use of insulin: Secondary | ICD-10-CM | POA: Insufficient documentation

## 2018-07-26 DIAGNOSIS — R509 Fever, unspecified: Secondary | ICD-10-CM | POA: Diagnosis present

## 2018-07-26 DIAGNOSIS — J111 Influenza due to unidentified influenza virus with other respiratory manifestations: Secondary | ICD-10-CM | POA: Diagnosis not present

## 2018-07-26 DIAGNOSIS — R69 Illness, unspecified: Secondary | ICD-10-CM

## 2018-07-26 DIAGNOSIS — Z79899 Other long term (current) drug therapy: Secondary | ICD-10-CM | POA: Insufficient documentation

## 2018-07-26 DIAGNOSIS — I509 Heart failure, unspecified: Secondary | ICD-10-CM | POA: Insufficient documentation

## 2018-07-26 LAB — COMPREHENSIVE METABOLIC PANEL
ALT: 31 U/L (ref 0–44)
AST: 29 U/L (ref 15–41)
Albumin: 3.5 g/dL (ref 3.5–5.0)
Alkaline Phosphatase: 67 U/L (ref 38–126)
Anion gap: 9 (ref 5–15)
BUN: 25 mg/dL — ABNORMAL HIGH (ref 6–20)
CHLORIDE: 102 mmol/L (ref 98–111)
CO2: 28 mmol/L (ref 22–32)
CREATININE: 3.74 mg/dL — AB (ref 0.44–1.00)
Calcium: 8.6 mg/dL — ABNORMAL LOW (ref 8.9–10.3)
GFR calc Af Amer: 15 mL/min — ABNORMAL LOW (ref >60)
GFR calc non Af Amer: 13 mL/min — ABNORMAL LOW (ref >60)
Glucose, Bld: 202 mg/dL — ABNORMAL HIGH (ref 70–99)
Potassium: 3.3 mmol/L — ABNORMAL LOW (ref 3.5–5.1)
Sodium: 139 mmol/L (ref 135–145)
Total Bilirubin: 0.4 mg/dL (ref 0.3–1.2)
Total Protein: 7.1 g/dL (ref 6.5–8.1)

## 2018-07-26 LAB — CBC WITH DIFFERENTIAL/PLATELET
Abs Immature Granulocytes: 0.01 10*3/uL (ref 0.00–0.07)
BASOS PCT: 0 %
Basophils Absolute: 0 10*3/uL (ref 0.0–0.1)
EOS ABS: 0.1 10*3/uL (ref 0.0–0.5)
Eosinophils Relative: 1 %
HCT: 33.4 % — ABNORMAL LOW (ref 36.0–46.0)
Hemoglobin: 10.6 g/dL — ABNORMAL LOW (ref 12.0–15.0)
Immature Granulocytes: 0 %
Lymphocytes Relative: 27 %
Lymphs Abs: 1 10*3/uL (ref 0.7–4.0)
MCH: 28.9 pg (ref 26.0–34.0)
MCHC: 31.7 g/dL (ref 30.0–36.0)
MCV: 91 fL (ref 80.0–100.0)
Monocytes Absolute: 0.3 10*3/uL (ref 0.1–1.0)
Monocytes Relative: 9 %
Neutro Abs: 2.2 10*3/uL (ref 1.7–7.7)
Neutrophils Relative %: 63 %
Platelets: 106 10*3/uL — ABNORMAL LOW (ref 150–400)
RBC: 3.67 MIL/uL — ABNORMAL LOW (ref 3.87–5.11)
RDW: 15.3 % (ref 11.5–15.5)
WBC: 3.5 10*3/uL — ABNORMAL LOW (ref 4.0–10.5)
nRBC: 0 % (ref 0.0–0.2)

## 2018-07-26 LAB — INFLUENZA PANEL BY PCR (TYPE A & B)
Influenza A By PCR: NEGATIVE
Influenza B By PCR: NEGATIVE

## 2018-07-26 LAB — LIPASE, BLOOD: Lipase: 71 U/L — ABNORMAL HIGH (ref 11–51)

## 2018-07-26 LAB — GROUP A STREP BY PCR: Group A Strep by PCR: NOT DETECTED

## 2018-07-26 MED ORDER — OSELTAMIVIR PHOSPHATE 30 MG PO CAPS: 30.0000 mg | ORAL_CAPSULE | Freq: Every day | ORAL | 0 refills | Status: DC

## 2018-07-26 MED ORDER — OSELTAMIVIR PHOSPHATE 30 MG PO CAPS
30.0000 mg | ORAL_CAPSULE | Freq: Once | ORAL | Status: AC
  Administered 2018-07-26: 30 mg via ORAL
  Filled 2018-07-26: qty 1

## 2018-07-26 NOTE — ED Provider Notes (Signed)
[facility] EMERGENCY DEPARTMENT Provider Note   CSN: [id number] Arrival date & time: 07/26/18  1036     History   Chief Complaint Chief Complaint  Patient presents with  . Cough    HPI Martha [name] is a 52 y.o. female.  HPI Patient presents with 2 days of subjective fevers and chills, headache, nonproductive cough, rhinorrhea, sore throat and diffuse myalgias.  States she was unable to finish her dialysis treatment today due to her symptoms.  She is also had upper abdominal pain but denies vomiting or diarrhea. Past Medical History:  Diagnosis Date  . Acid reflux    takes Tums  . Anemia   . Arthritis   . Bipolar 1 disorder ([place])   . Blindness of left eye   . Cervical radiculopathy   . CHF (congestive heart failure) ([place])   . Chronic kidney disease    Stage 5- 01/25/17  . Constipation   . COPD (chronic obstructive pulmonary disease) ([place]) 2014   bronchitis  . Degenerative disc disease, thoracic   . Depression   . Diabetes mellitus    Type II  . Diabetic retinopathy ([place])   . Dyspnea    when walking  . End stage kidney disease ([place])    M, W, F [facility]  . Hypertension   . Noncompliance with medication regimen   . Noncompliance with medication regimen   . Obesity (BMI 30-39.9)   . OSA (obstructive sleep apnea)    cpap  . Panic attack   . RLS (restless legs syndrome)     Patient Active Problem List   Diagnosis Date Noted  . SIRS (systemic inflammatory response syndrome) ([place]) 06/23/2018  . UTI (urinary tract infection) 02/26/2018  . Volume overload 02/25/2018  . 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 Dialysis patient ([place]) 12/13/2017  . ESRD (end stage renal disease) on dialysis ([place])   . Chronic diarrhea 10/08/2017  . Renal failure (ARF), acute on chronic (HCC) 04/22/2017  . Anasarca associated with disorder of kidney 04/20/2017  . Protein-calorie malnutrition, severe 11/29/2016  . HTN (hypertension), malignant 11/20/2016  .  Bipolar 1 disorder ([place]) 11/20/2016  . Noncompliance with medication regimen 11/20/2016  . Panic attack 11/20/2016  . Uncontrolled type 2 diabetes mellitus with hyperglycemia, without long-term current use of insulin ([place]) 10/27/2016  . Sensory disturbance 10/27/2016  . Left hemiparesis ([place]) 10/27/2016  . Cholelithiasis 04/26/2014  . Hyponatremia 04/26/2014  . Gallstones 04/06/2014  . Diabetic ulcer of left great toe ([place]) 10/09/2013  . Diabetes mellitus, type 2 ([place]) 05/30/2013  . Hyperglycemia 05/30/2013  . Internal hemorrhoids with other complication 40/98/1191  . Umbilical hernia without mention of obstruction or gangrene 04/15/2013  . Anemia 04/15/2013  . DM 05/13/2010  . Hyperlipidemia 05/13/2010  . Morbid obesity ([place]) 05/13/2010  . Depression with anxiety 05/13/2010  . RESTLESS LEG SYNDROME 05/13/2010  . Essential hypertension 05/13/2010  . GERD 05/13/2010  . RECTAL BLEEDING 05/13/2010  . Sleep apnea 05/13/2010  . Dysphagia, oropharyngeal phase 05/13/2010    Past Surgical History:  Procedure Laterality Date  . AV FISTULA PLACEMENT Right 01/27/2017   Procedure: ARTERIOVENOUS (AV) FISTULA CREATION-RIGHT ARM;  Surgeon: Elam Dutch, MD;  Location: [place];  Service: Vascular;  Laterality: Right;  . BIOPSY N/A 05/17/2013   Procedure: BIOPSY;  Surgeon: [name], MD;  Location: [facility];  Service: Endoscopy;  Laterality: N/A;  . CHOLECYSTECTOMY N/A 04/27/2014   Procedure: LAPAROSCOPIC CHOLECYSTECTOMY;  Surgeon: [name],  MD;  Location: [facility];  Service: General;  Laterality: N/A;  . COLONOSCOPY  06/06/2010   FUX:NATFTD bleeding secondary to internal hemorrhoids but incomplete evaluation secondary to poor right colon prep/small rectal and sigmoid colon polyps (hyperplastic). PROPOFOL  . COLONOSCOPY  May 2013   Dr. Gilliam/[facility]: 5 mm a descending colon polyp, hyperplastic. Adequate bowel prep.  . ENDOMETRIAL ABLATION    . ENUCLEATION Left 02/04/2018    ENUCLEATION WITH PLACEMENT OF IMPLANT LEFT EYE  . ENUCLEATION Left 02/04/2018   Procedure: ENUCLEATION WITH PLACEMENT OF IMPLANT LEFT EYE;  Surgeon: Clista Bernhardt, MD;  Location: [place];  Service: Ophthalmology;  Laterality: Left;  . ESOPHAGOGASTRODUODENOSCOPY  06/06/2010   DUK:GURKYHC erythema and edema of body of stomach, with sessile polypoid lesions. bx benign. no h.pylori  . ESOPHAGOGASTRODUODENOSCOPY (EGD) WITH PROPOFOL N/A 05/17/2013   Dr. Oneida Alar: normal esophagus, moderate nodular gastritis, negative path, empiric Savary dilation  . EYE SURGERY  07/2017   sx for glaucoma  . FLEXIBLE SIGMOIDOSCOPY N/A 05/17/2013   Dr. Oneida Alar: moderate sized internal hemorrhoids  . HEMORRHOID BANDING N/A 05/17/2013   Procedure: HEMORRHOID BANDING;  Surgeon: [name], MD;  Location[facility];  Service: Endoscopy;  Laterality: N/A;  2 bands placed  . INCISION AND DRAINAGE ABSCESS Left 10/11/2013   Procedure: INCISION AND DRAINAGE AND DEBRIDEMENT LEFT BREAST  ABSCESS;  Surgeon: [name], MD;  Location: [facility];  Service: General;  Laterality: Left;  . INSERTION OF DIALYSIS CATHETER N/A 06/08/2018   Procedure: INSERTION OF Right internal jugular TUNNELED  DIALYSIS CATHETER;  Surgeon: Angelia Mould, MD;  Location: [place];  Service: Vascular;  Laterality: N/A;  . IRRIGATION AND DEBRIDEMENT ABSCESS Right 06/01/2013   Procedure: INCISION AND DRAINAGE AND DEBRIDEMENT ABSCESS RIGHT BREAST;  Surgeon: [name], MD;  Location: [facility];  Service: General;  Laterality: Right;  . LEFT EYE REMOVED Left 01/2018   [facility] on [address].  Marland Kitchen LIGATION OF ARTERIOVENOUS  FISTULA Right 06/08/2018   Procedure: LIGATION OF ARTERIOVENOUS  FISTULA RIGHT ARM;  Surgeon: Angelia Mould, MD;  Location: [facility];  Service: Vascular;  Laterality: Right;  . SAVORY DILATION N/A 05/17/2013   Procedure: SAVORY DILATION;  Surgeon: [name], MD;  Location: [facility];  Service: Endoscopy;   Laterality: N/A;  14/15/16  . SKIN FULL THICKNESS GRAFT Left 05/05/2018   Procedure: ABDOMINAL DERMIS FAT SKIN GRAFT FULL THICKNESS LEFT EYE;  Surgeon: Clista Bernhardt, MD;  Location: [facility];  Service: Ophthalmology;  Laterality: Left;  . TUBAL LIGATION       OB History    Gravida  2   Para  2   Term  1   Preterm  1   AB      Living  2     SAB      TAB      Ectopic      Multiple      Live Births               Home Medications    Prior to Admission medications   Medication Sig Start Date End Date Taking? Authorizing Provider  albuterol (PROVENTIL) (2.5 MG/3ML) 0.083% nebulizer solution Take 2.5 mg by nebulization 4 (four) times daily.   Yes [provider]  ALPRAZolam Duanne Moron) 1 MG tablet Take 1 mg by mouth 4 (four) times daily.    Yes [provider]  atorvastatin (LIPITOR) 20 MG tablet Take 20 mg by mouth daily.  Yes [provider]  busPIRone (BUSPAR) 7.5 MG tablet Take 7.5 mg by mouth 2 (two) times daily.   Yes [provider]  calcium carbonate (TUMS EX) 750 MG chewable tablet Chew 1-3 tablets by mouth See admin instructions. Patient takes 3 tablets with meals and 1 tablet with snacks three times a day   Yes [provider]  colchicine 0.6 MG tablet Take 1 tablet (0.6 mg total) by mouth every other day. Patient taking differently: Take 0.6 mg by mouth every other day. Take 1 tablet every other day 11/19/16  Yes Sinda Du, MD  dicyclomine (BENTYL) 10 MG capsule Take 1 capsule (10 mg total) by mouth 4 (four) times daily -  before meals and at bedtime. Patient taking differently: Take 10 mg by mouth 4 (four) times daily.  10/08/17  Yes Annitta Needs, NP  gabapentin (NEURONTIN) 300 MG capsule Take 300 mg by mouth at bedtime.    Yes [provider]  HYDROcodone-acetaminophen (NORCO) 10-325 MG tablet Take 1 tablet by mouth every 6 (six) hours as needed for moderate pain. Patient taking differently: Take 1  tablet by mouth 4 (four) times daily.  01/27/17  Yes Rhyne, Samantha J, PA-C  Insulin Glargine (LANTUS SOLOSTAR) 100 UNIT/ML Solostar Pen Inject 30 Units into the skin every morning. And pen needles 1/day 06/29/18  Yes Renato Shin, MD  losartan (COZAAR) 25 MG tablet Take 25 mg by mouth daily.   Yes [provider]  PARoxetine (PAXIL) 20 MG tablet Take 1 daily. This is to prevent panic attacks Patient taking differently: Take 20-40 mg by mouth daily. This is to prevent panic attacks 10/28/16  Yes Sinda Du, MD  torsemide (DEMADEX) 100 MG tablet Take 100 mg by mouth See admin instructions. TAKE ONE TABLET BY MOUTH ON NON DIALYSIS DAYS ONLY-   Yes [provider]  cefUROXime (CEFTIN) 250 MG tablet Take 2 tablets after each dialysis x5 occasions Patient not taking: Reported on 07/26/2018 06/23/18   Sinda Du, MD  lidocaine-prilocaine (EMLA) cream Apply 1 application topically as needed (topical anesthesia for hemodialysis if Gebauers and Lidocaine injection are ineffective.). 04/25/17   Johnson, Clanford L, MD  oseltamivir (TAMIFLU) 30 MG capsule Take 1 capsule (30 mg total) by mouth daily. 07/27/18   Julianne Rice, MD    Family History Family History  Adopted: Yes  Family history unknown: Yes    Social History Social History   Tobacco Use  . Smoking status: Former Smoker    Packs/day: 1.50    Years: 25.00    Pack years: 37.50    Types: Cigarettes    Last attempt to quit: 10/23/2007    Years since quitting: 10.7  . Smokeless tobacco: Never Used  Substance Use Topics  . Alcohol use: No  . Drug use: No     Allergies   Codeine and Tape   Review of Systems Review of Systems  Constitutional: Positive for chills, fatigue and fever.  HENT: Positive for rhinorrhea, sinus pressure and sore throat.   Respiratory: Positive for cough. Negative for shortness of breath.   Cardiovascular: Negative for chest pain.  Gastrointestinal: Positive for abdominal pain.  Negative for constipation, diarrhea, nausea and vomiting.  Genitourinary: Negative for dysuria, frequency and hematuria.  Musculoskeletal: Positive for myalgias. Negative for back pain, neck pain and neck stiffness.  Skin: Negative for rash and wound.  Neurological: Positive for headaches. Negative for dizziness, weakness, light-headedness and numbness.  All other systems reviewed and are negative.  Physical Exam Updated Vital Signs BP 128/63   Pulse 63   Temp 98.6 F (37 C) (Oral)   Resp 18   Ht 5\' 7"  (1.702 m)   Wt (!) 153.3 kg   SpO2 99%   BMI 52.94 kg/m   Physical Exam Vitals signs and nursing note reviewed.  Constitutional:      Appearance: Normal appearance. She is well-developed. She is obese.  HENT:     Head: Normocephalic and atraumatic.     Nose: Rhinorrhea present.     Mouth/Throat:     Mouth: Mucous membranes are moist.     Pharynx: Posterior oropharyngeal erythema present. No oropharyngeal exudate.     Comments: Uvula is midline. Eyes:     Extraocular Movements: Extraocular movements intact.     Pupils: Pupils are equal, round, and reactive to light.     Comments: Missing left globe with drainage which patient states is normal for her.  Neck:     Musculoskeletal: Normal range of motion and neck supple. No neck rigidity or muscular tenderness.     Comments: No meningismus Cardiovascular:     Rate and Rhythm: Normal rate and regular rhythm.     Heart sounds: No murmur. No friction rub. No gallop.   Pulmonary:     Effort: Pulmonary effort is normal. No respiratory distress.     Breath sounds: Normal breath sounds. No stridor. No wheezing, rhonchi or rales.     Comments: Dialysis catheter and right upper chest without tenderness at insertion site, erythema or warmth. Chest:     Chest wall: No tenderness.  Abdominal:     General: Bowel sounds are normal.     Palpations: Abdomen is soft.     Tenderness: There is abdominal tenderness. There is no guarding  or rebound.     Comments: Mild epigastric and right upper quadrant tenderness to palpation.  Musculoskeletal: Normal range of motion.        General: No tenderness.     Comments: No definite CVA tenderness  Lymphadenopathy:     Cervical: No cervical adenopathy.  Skin:    General: Skin is warm and dry.     Findings: No erythema or rash.  Neurological:     General: No focal deficit present.     Mental Status: She is alert and oriented to person, place, and time.  Psychiatric:        Behavior: Behavior normal.      ED Treatments / Results  Labs (all labs ordered are listed, but only abnormal results are displayed) Labs Reviewed  CBC WITH DIFFERENTIAL/PLATELET - Abnormal; Notable for the following components:      Result Value   WBC 3.5 (*)    RBC 3.67 (*)    Hemoglobin 10.6 (*)    HCT 33.4 (*)    Platelets 106 (*)    All other components within normal limits  COMPREHENSIVE METABOLIC PANEL - Abnormal; Notable for the following components:   Potassium 3.3 (*)    Glucose, Bld 202 (*)    BUN 25 (*)    Creatinine, Ser 3.74 (*)    Calcium 8.6 (*)    GFR calc non Af Amer 13 (*)    GFR calc Af Amer 15 (*)    All other components within normal limits  LIPASE, BLOOD - Abnormal; Notable for the following components:   Lipase 71 (*)    All other components within normal limits  GROUP A STREP BY PCR  INFLUENZA PANEL  BY PCR (TYPE A & B)  URINALYSIS, ROUTINE W REFLEX MICROSCOPIC    EKG None  Radiology Dg Chest 2 View  Result Date: 07/26/2018 CLINICAL DATA:  Nonproductive cough. Chronic shortness of breath. EXAM: CHEST - 2 VIEW COMPARISON:  06/23/2018 FINDINGS: The heart size and mediastinal contours are within normal limits. Both lungs are clear. The visualized skeletal structures are unremarkable. Central venous catheter tip at the cavoatrial junction, unchanged. IMPRESSION: No active cardiopulmonary disease. Electronically Signed   By: Lorriane Shire M.D.   On: 07/26/2018 13:31     Procedures Procedures (including critical care time)  Medications Ordered in ED Medications  oseltamivir (TAMIFLU) capsule 30 mg (has no administration in time range)     Initial Impression / Assessment and Plan / ED Course  I have reviewed the triage vital signs and the nursing notes.  Pertinent labs & imaging results that were available during my care of the patient were reviewed by me and considered in my medical decision making (see chart for details).     Patient with flulike symptoms.  Abdominal exam is benign.  Chest x-ray without acute findings.  Unable to produce any urine at this time.  States that she is not having any urinary symptoms.  Discussed with pharmacy regarding Tamiflu dosing.  Strict return precautions given.  Final Clinical Impressions(s) / ED Diagnoses   Final diagnoses:  Influenza-like illness    ED Discharge Orders         Ordered    oseltamivir (TAMIFLU) 30 MG capsule  Daily     07/26/18 1529           Julianne Rice, MD 07/26/18 1530

## 2018-07-26 NOTE — ED Triage Notes (Signed)
Pt reports coughing, sneezing, runny nose, and sore throat x 2 days.  Pt on dialysis and had treatment today.

## 2018-07-31 ENCOUNTER — Encounter (HOSPITAL_COMMUNITY): Payer: Self-pay | Admitting: *Deleted

## 2018-07-31 NOTE — Progress Notes (Signed)
Patient states she was seen on 12/30 in ER for Shob that was bronchitis but is feeling better. Reviewed instructions provided by my Co-worker Bruna Potter. Patient able to verbalize all preop instructions and states that medical history has not changed.   I reinforced the below instructions regarding management of DM and reviewed that she did know the correct medications to take.        o Check your blood sugar the morning of your surgery when you wake up and every 2 hours until you get to the Short Stay unit. . If your blood sugar is less than 70 mg/dL, you will need to treat for low blood sugar: o Do not take insulin. o Treat a low blood sugar (less than 70 mg/dL) with  cup of clear juice (cranberry or apple), 4 glucose tablets, OR glucose gel. Recheck blood sugar in 15 minutes after treatment (to make sure it is greater than 70 mg/dL). If your blood sugar is not greater than 70 mg/dL on recheck, call [phone] o  for further instructions. . Report your blood sugar to the short stay nurse when you get to Short Stay.  . If you are admitted to the hospital after surgery: o Your blood sugar will be checked by the staff and you will probably be given insulin after surgery (instead of oral diabetes medicines) to make sure you have good blood sugar levels. o The goal for blood sugar control after surgery is 80-180 mg/dL.    . THE MORNING OF SURGERY, take ____15_________ units of _Lantus_________insulin.  . The day of surgery, do not take other diabetes injectables, including Byetta (exenatide), Bydureon (exenatide ER), Victoza (liraglutide), or Trulicity (dulaglutide).

## 2018-08-02 ENCOUNTER — Telehealth: Payer: Self-pay | Admitting: *Deleted

## 2018-08-02 MED ORDER — DEXTROSE 5 % IV SOLN
3.0000 g | INTRAVENOUS | Status: AC
  Administered 2018-08-03: 3 g via INTRAVENOUS
  Filled 2018-08-02: qty 3

## 2018-08-02 NOTE — Telephone Encounter (Signed)
Call to patient with time change for surgery on 08/03/2018. Instructed to be at [facility] admitting at 8 am. All other pre-op instructions unchanged. Verbalized understanding.

## 2018-08-03 ENCOUNTER — Telehealth: Payer: Self-pay | Admitting: Surgery

## 2018-08-03 ENCOUNTER — Ambulatory Visit (HOSPITAL_COMMUNITY): Payer: Medicaid Other | Admitting: Certified Registered Nurse Anesthetist

## 2018-08-03 ENCOUNTER — Ambulatory Visit (HOSPITAL_COMMUNITY)
Admission: RE | Admit: 2018-08-03 | Discharge: 2018-08-03 | Disposition: A | Discharge status: Home / Self Care | Payer: Medicaid Other | Source: Ambulatory Visit | Attending: Vascular Surgery | Admitting: Vascular Surgery

## 2018-08-03 ENCOUNTER — Ambulatory Visit: Payer: Medicaid Other | Admitting: Endocrinology

## 2018-08-03 ENCOUNTER — Other Ambulatory Visit: Payer: Self-pay

## 2018-08-03 ENCOUNTER — Telehealth: Payer: Self-pay | Admitting: Endocrinology

## 2018-08-03 ENCOUNTER — Encounter (HOSPITAL_COMMUNITY)
Admission: RE | Disposition: A | Discharge status: Home / Self Care | Payer: Self-pay | Source: Ambulatory Visit | Attending: Vascular Surgery

## 2018-08-03 ENCOUNTER — Encounter (HOSPITAL_COMMUNITY): Payer: Self-pay | Admitting: *Deleted

## 2018-08-03 DIAGNOSIS — Z6841 Body Mass Index (BMI) 40.0 and over, adult: Secondary | ICD-10-CM | POA: Insufficient documentation

## 2018-08-03 DIAGNOSIS — Z87891 Personal history of nicotine dependence: Secondary | ICD-10-CM | POA: Insufficient documentation

## 2018-08-03 DIAGNOSIS — Z794 Long term (current) use of insulin: Secondary | ICD-10-CM | POA: Insufficient documentation

## 2018-08-03 DIAGNOSIS — J449 Chronic obstructive pulmonary disease, unspecified: Secondary | ICD-10-CM | POA: Insufficient documentation

## 2018-08-03 DIAGNOSIS — E1122 Type 2 diabetes mellitus with diabetic chronic kidney disease: Secondary | ICD-10-CM | POA: Diagnosis not present

## 2018-08-03 DIAGNOSIS — Z79899 Other long term (current) drug therapy: Secondary | ICD-10-CM | POA: Insufficient documentation

## 2018-08-03 DIAGNOSIS — I120 Hypertensive chronic kidney disease with stage 5 chronic kidney disease or end stage renal disease: Secondary | ICD-10-CM | POA: Insufficient documentation

## 2018-08-03 DIAGNOSIS — Z7951 Long term (current) use of inhaled steroids: Secondary | ICD-10-CM | POA: Diagnosis not present

## 2018-08-03 DIAGNOSIS — D631 Anemia in chronic kidney disease: Secondary | ICD-10-CM | POA: Diagnosis not present

## 2018-08-03 DIAGNOSIS — F319 Bipolar disorder, unspecified: Secondary | ICD-10-CM | POA: Insufficient documentation

## 2018-08-03 DIAGNOSIS — K219 Gastro-esophageal reflux disease without esophagitis: Secondary | ICD-10-CM | POA: Insufficient documentation

## 2018-08-03 DIAGNOSIS — F419 Anxiety disorder, unspecified: Secondary | ICD-10-CM | POA: Diagnosis not present

## 2018-08-03 DIAGNOSIS — G4730 Sleep apnea, unspecified: Secondary | ICD-10-CM | POA: Diagnosis not present

## 2018-08-03 DIAGNOSIS — N186 End stage renal disease: Secondary | ICD-10-CM | POA: Diagnosis present

## 2018-08-03 DIAGNOSIS — N185 Chronic kidney disease, stage 5: Secondary | ICD-10-CM | POA: Diagnosis not present

## 2018-08-03 DIAGNOSIS — Z992 Dependence on renal dialysis: Secondary | ICD-10-CM | POA: Diagnosis not present

## 2018-08-03 HISTORY — PX: AV FISTULA PLACEMENT: SHX1204

## 2018-08-03 HISTORY — DX: Dyspnea, unspecified: R06.00

## 2018-08-03 HISTORY — DX: Constipation, unspecified: K59.00

## 2018-08-03 LAB — POCT I-STAT 4, (NA,K, GLUC, HGB,HCT)
Glucose, Bld: 230 mg/dL — ABNORMAL HIGH (ref 70–99)
HCT: 32 % — ABNORMAL LOW (ref 36.0–46.0)
Hemoglobin: 10.9 g/dL — ABNORMAL LOW (ref 12.0–15.0)
Potassium: 4 mmol/L (ref 3.5–5.1)
Sodium: 140 mmol/L (ref 135–145)

## 2018-08-03 LAB — GLUCOSE, CAPILLARY
Glucose-Capillary: 229 mg/dL — ABNORMAL HIGH (ref 70–99)
Glucose-Capillary: 173 mg/dL — ABNORMAL HIGH (ref 70–99)

## 2018-08-03 SURGERY — ARTERIOVENOUS (AV) FISTULA CREATION
Anesthesia: General | Site: Arm Upper | Laterality: Left

## 2018-08-03 MED ORDER — PROPOFOL 10 MG/ML IV BOLUS
INTRAVENOUS | Status: AC
  Filled 2018-08-03: qty 40

## 2018-08-03 MED ORDER — LIDOCAINE HCL (PF) 1 % IJ SOLN
INTRAMUSCULAR | Status: AC
  Filled 2018-08-03: qty 30

## 2018-08-03 MED ORDER — 0.9 % SODIUM CHLORIDE (POUR BTL) OPTIME
TOPICAL | Status: DC | PRN
  Administered 2018-08-03: 1000 mL

## 2018-08-03 MED ORDER — HEPARIN SODIUM (PORCINE) 1000 UNIT/ML IJ SOLN
INTRAMUSCULAR | Status: AC
  Filled 2018-08-03: qty 1

## 2018-08-03 MED ORDER — DEXAMETHASONE SODIUM PHOSPHATE 10 MG/ML IJ SOLN
INTRAMUSCULAR | Status: AC
  Filled 2018-08-03: qty 1

## 2018-08-03 MED ORDER — SODIUM CHLORIDE 0.9 % IV SOLN
INTRAVENOUS | Status: AC
  Filled 2018-08-03: qty 1.2

## 2018-08-03 MED ORDER — HEPARIN SODIUM (PORCINE) 1000 UNIT/ML IJ SOLN
INTRAMUSCULAR | Status: DC | PRN
  Administered 2018-08-03: 5000 [IU] via INTRAVENOUS

## 2018-08-03 MED ORDER — PHENYLEPHRINE 40 MCG/ML (10ML) SYRINGE FOR IV PUSH (FOR BLOOD PRESSURE SUPPORT)
PREFILLED_SYRINGE | INTRAVENOUS | Status: DC | PRN
Start: 2018-08-03 — End: 2018-08-03
  Administered 2018-08-03: 80 ug via INTRAVENOUS

## 2018-08-03 MED ORDER — MIDAZOLAM HCL 2 MG/2ML IJ SOLN
INTRAMUSCULAR | Status: AC
  Filled 2018-08-03: qty 2

## 2018-08-03 MED ORDER — ONDANSETRON HCL 4 MG/2ML IJ SOLN
INTRAMUSCULAR | Status: DC | PRN
  Administered 2018-08-03: 4 mg via INTRAVENOUS

## 2018-08-03 MED ORDER — SODIUM CHLORIDE 0.9 % IV SOLN
INTRAVENOUS | Status: DC
  Administered 2018-08-03: 08:00:00 via INTRAVENOUS

## 2018-08-03 MED ORDER — SODIUM CHLORIDE 0.9 % IV SOLN
INTRAVENOUS | Status: DC | PRN
  Administered 2018-08-03: 500 mL

## 2018-08-03 MED ORDER — SUCCINYLCHOLINE CHLORIDE 200 MG/10ML IV SOSY
PREFILLED_SYRINGE | INTRAVENOUS | Status: DC | PRN
  Administered 2018-08-03: 160 mg via INTRAVENOUS

## 2018-08-03 MED ORDER — FENTANYL CITRATE (PF) 100 MCG/2ML IJ SOLN
INTRAMUSCULAR | Status: AC
  Filled 2018-08-03: qty 2

## 2018-08-03 MED ORDER — OXYCODONE HCL 5 MG/5ML PO SOLN: 5.0000 mg | Freq: Once | ORAL | Status: DC | PRN

## 2018-08-03 MED ORDER — FENTANYL CITRATE (PF) 250 MCG/5ML IJ SOLN
INTRAMUSCULAR | Status: AC
  Filled 2018-08-03: qty 5

## 2018-08-03 MED ORDER — PROPOFOL 10 MG/ML IV BOLUS
INTRAVENOUS | Status: DC | PRN
  Administered 2018-08-03: 40 mg via INTRAVENOUS
  Administered 2018-08-03: 200 mg via INTRAVENOUS

## 2018-08-03 MED ORDER — SODIUM CHLORIDE 0.9 % IV SOLN
INTRAVENOUS | Status: DC | PRN
  Administered 2018-08-03: 15 ug/min via INTRAVENOUS

## 2018-08-03 MED ORDER — PHENYLEPHRINE 40 MCG/ML (10ML) SYRINGE FOR IV PUSH (FOR BLOOD PRESSURE SUPPORT)
PREFILLED_SYRINGE | INTRAVENOUS | Status: AC
  Filled 2018-08-03: qty 10

## 2018-08-03 MED ORDER — LABETALOL HCL 5 MG/ML IV SOLN
10.0000 mg | Freq: Once | INTRAVENOUS | Status: AC
  Filled 2018-08-03: qty 4

## 2018-08-03 MED ORDER — LABETALOL HCL 5 MG/ML IV SOLN
INTRAVENOUS | Status: AC
  Filled 2018-08-03: qty 4

## 2018-08-03 MED ORDER — LIDOCAINE 2% (20 MG/ML) 5 ML SYRINGE
INTRAMUSCULAR | Status: DC | PRN
Start: 2018-08-03 — End: 2018-08-03
  Administered 2018-08-03: 100 mg via INTRAVENOUS

## 2018-08-03 MED ORDER — DEXAMETHASONE SODIUM PHOSPHATE 10 MG/ML IJ SOLN
INTRAMUSCULAR | Status: DC | PRN
  Administered 2018-08-03: 5 mg via INTRAVENOUS

## 2018-08-03 MED ORDER — FENTANYL CITRATE (PF) 100 MCG/2ML IJ SOLN
25.0000 ug | INTRAMUSCULAR | Status: DC | PRN
  Administered 2018-08-03 (×2): 50 ug via INTRAVENOUS

## 2018-08-03 MED ORDER — ONDANSETRON HCL 4 MG/2ML IJ SOLN
INTRAMUSCULAR | Status: AC
  Filled 2018-08-03: qty 2

## 2018-08-03 MED ORDER — ONDANSETRON HCL 4 MG/2ML IJ SOLN: 4.0000 mg | Freq: Once | INTRAMUSCULAR | Status: DC | PRN

## 2018-08-03 MED ORDER — HYDROCODONE-ACETAMINOPHEN 10-325 MG PO TABS: 1.0000 | ORAL_TABLET | Freq: Four times a day (QID) | ORAL | 0 refills | Status: DC | PRN

## 2018-08-03 MED ORDER — MIDAZOLAM HCL 5 MG/5ML IJ SOLN
INTRAMUSCULAR | Status: DC | PRN
  Administered 2018-08-03: 2 mg via INTRAVENOUS

## 2018-08-03 MED ORDER — OXYCODONE HCL 5 MG PO TABS: 5.0000 mg | ORAL_TABLET | Freq: Once | ORAL | Status: DC | PRN

## 2018-08-03 MED ORDER — ARTIFICIAL TEARS OPHTHALMIC OINT
TOPICAL_OINTMENT | OPHTHALMIC | Status: DC | PRN
Start: 2018-08-03 — End: 2018-08-03
  Administered 2018-08-03: 1 via OPHTHALMIC

## 2018-08-03 MED ORDER — FENTANYL CITRATE (PF) 250 MCG/5ML IJ SOLN
INTRAMUSCULAR | Status: DC | PRN
  Administered 2018-08-03 (×5): 50 ug via INTRAVENOUS

## 2018-08-03 SURGICAL SUPPLY — 37 items
ARMBAND PINK RESTRICT EXTREMIT (MISCELLANEOUS) ×6 IMPLANT
CANISTER SUCT 3000ML PPV (MISCELLANEOUS) ×3 IMPLANT
CANNULA VESSEL 3MM 2 BLNT TIP (CANNULA) ×3 IMPLANT
CLIP VESOCCLUDE MED 6/CT (CLIP) ×3 IMPLANT
CLIP VESOCCLUDE SM WIDE 6/CT (CLIP) ×3 IMPLANT
COVER PROBE W GEL 5X96 (DRAPES) IMPLANT
COVER WAND RF STERILE (DRAPES) ×3 IMPLANT
DECANTER SPIKE VIAL GLASS SM (MISCELLANEOUS) ×3 IMPLANT
DERMABOND ADVANCED (GAUZE/BANDAGES/DRESSINGS) ×2
DERMABOND ADVANCED .7 DNX12 (GAUZE/BANDAGES/DRESSINGS) ×2 IMPLANT
DRAIN PENROSE 1/4X12 LTX STRL (WOUND CARE) ×3 IMPLANT
ELECT REM PT RETURN 9FT ADLT (ELECTROSURGICAL) ×3
ELECTRODE REM PT RTRN 9FT ADLT (ELECTROSURGICAL) ×2 IMPLANT
GLOVE BIO SURGEON STRL SZ7.5 (GLOVE) ×3 IMPLANT
GOWN STRL REUS W/ TWL LRG LVL3 (GOWN DISPOSABLE) ×6 IMPLANT
GOWN STRL REUS W/TWL LRG LVL3 (GOWN DISPOSABLE) ×3
HEMOSTAT SPONGE AVITENE ULTRA (HEMOSTASIS) IMPLANT
KIT BASIN OR (CUSTOM PROCEDURE TRAY) ×3 IMPLANT
KIT TURNOVER KIT B (KITS) ×3 IMPLANT
LOOP VESSEL MINI RED (MISCELLANEOUS) IMPLANT
NS IRRIG 1000ML POUR BTL (IV SOLUTION) ×3 IMPLANT
PACK CV ACCESS (CUSTOM PROCEDURE TRAY) ×3 IMPLANT
PAD ARMBOARD 7.5X6 YLW CONV (MISCELLANEOUS) ×6 IMPLANT
SPONGE SURGIFOAM ABS GEL 100 (HEMOSTASIS) IMPLANT
SUT PROLENE 6 0 BV (SUTURE) ×3 IMPLANT
SUT PROLENE 6 0 CC (SUTURE) ×6 IMPLANT
SUT PROLENE 7 0 BV 1 (SUTURE) ×4 IMPLANT
SUT SILK 3 0 (SUTURE) ×1
SUT SILK 3-0 18XBRD TIE 12 (SUTURE) IMPLANT
SUT VIC AB 3-0 SH 27 (SUTURE) ×3
SUT VIC AB 3-0 SH 27X BRD (SUTURE) ×4 IMPLANT
SUT VIC AB 4-0 PS2 18 (SUTURE) ×1 IMPLANT
SUT VICRYL 4-0 PS2 18IN ABS (SUTURE) ×6 IMPLANT
SYR TOOMEY 50ML (SYRINGE) IMPLANT
TOWEL GREEN STERILE (TOWEL DISPOSABLE) ×3 IMPLANT
UNDERPAD 30X30 (UNDERPADS AND DIAPERS) ×3 IMPLANT
WATER STERILE IRR 1000ML POUR (IV SOLUTION) ×3 IMPLANT

## 2018-08-03 NOTE — Discharge Instructions (Signed)
° °  [facility] [place] ° °Discharge Instructions ° °AV Fistula or Graft Surgery for Dialysis Access ° °Please refer to the following instructions for your post-procedure care. Your surgeon or physician assistant will discuss any changes with you. ° °Activity ° °You may drive the day following your surgery, if you are comfortable and no longer taking prescription pain medication. Resume full activity as the soreness in your incision resolves. ° °Bathing/Showering ° °You may shower after you go home. Keep your incision dry for 48 hours. Do not soak in a bathtub, hot tub, or swim until the incision heals completely. You may not shower if you have a hemodialysis catheter. ° °Incision Care ° °Clean your incision with mild soap and water after 48 hours. Pat the area dry with a clean towel. You do not need a bandage unless otherwise instructed. Do not apply any ointments or creams to your incision. You may have skin glue on your incision. Do not peel it off. It will come off on its own in about one week. Your arm may swell a bit after surgery. To reduce swelling use pillows to elevate your arm so it is above your heart. Your doctor will tell you if you need to lightly wrap your arm with an ACE bandage. ° °Diet ° °Resume your normal diet. There are not special food restrictions following this procedure. In order to heal from your surgery, it is CRITICAL to get adequate nutrition. Your body requires vitamins, minerals, and protein. Vegetables are the best source of vitamins and minerals. Vegetables also provide the perfect balance of protein. Processed food has little nutritional value, so try to avoid this. ° °Medications ° °Resume taking all of your medications. If your incision is causing pain, you may take over-the counter pain relievers such as acetaminophen (Tylenol). If you were prescribed a stronger pain medication, please be aware these medications can cause nausea and constipation. Prevent  nausea by taking the medication with a snack or meal. Avoid constipation by drinking plenty of fluids and eating foods with high amount of fiber, such as fruits, vegetables, and grains. Do not take Tylenol if you are taking prescription pain medications. ° ° ° ° °Follow up °Your surgeon may want to see you in the office following your access surgery. If so, this will be arranged at the time of your surgery. ° °Please call us immediately for any of the following conditions: ° °Increased pain, redness, drainage (pus) from your incision site °Fever of 101 degrees or higher °Severe or worsening pain at your incision site °Hand pain or numbness. ° °Reduce your risk of vascular disease: ° °Stop smoking. If you would like help, call [facility] at 1-800-QUIT-NOW (1-800-784-8669) or [place] at 336-586-4000 ° °Manage your cholesterol °Maintain a desired weight °Control your diabetes °Keep your blood pressure down ° °Dialysis ° °It will take several weeks to several months for your new dialysis access to be ready for use. Your surgeon will determine when it is OK to use it. Your nephrologist will continue to direct your dialysis. You can continue to use your Permcath until your new access is ready for use. ° °If you have any questions, please call the office at [phone]. ° °

## 2018-08-03 NOTE — Anesthesia Postprocedure Evaluation (Signed)
Anesthesia Post Note  Patient: Martha [name]  Procedure(s) Performed: LEFT ARTERIOVENOUS (AV) FISTULA CREATION (Left Arm Upper)     Patient location during evaluation: PACU Anesthesia Type: General Level of consciousness: awake and alert Pain management: pain level controlled Vital Signs Assessment: post-procedure vital signs reviewed and stable Respiratory status: spontaneous breathing, nonlabored ventilation and respiratory function stable Cardiovascular status: blood pressure returned to baseline and stable Postop Assessment: no apparent nausea or vomiting Anesthetic complications: no    Last Vitals:  Vitals:   08/03/18 1236 08/03/18 1300  BP: (!) 128/58 (!) 179/87  Pulse: 69 75  Resp:  14  Temp: (!) 36.4 C (!) 36.4 C  SpO2: 99% 99%    Last Pain:  Vitals:   08/03/18 1240  TempSrc:   PainSc: 0-No pain                 Lidia Collum

## 2018-08-03 NOTE — Telephone Encounter (Signed)
Patient no showed today's appt. Please advise on how to follow up. °A. No follow up necessary. °B. Follow up urgent. Contact patient immediately. °C. Follow up necessary. Contact patient and schedule visit in ___ days. °D. Follow up advised. Contact patient and schedule visit in ____weeks. ° °Would you like the NS fee to be applied to this visit? ° °

## 2018-08-03 NOTE — Progress Notes (Signed)
Patient's BP upon arrival to short stay was elevated at 225/133 and 225/105.  Patient's CBG was 230.  Dr. Christella Hartigan notified.  Received order for 10mg  labetalol.  Rechecked patient's BP before administering medication and patient's BP 182/82 and 181/74.  Labetalol not administered.  Dr. Christella Hartigan notified.

## 2018-08-03 NOTE — Transfer of Care (Signed)
Immediate Anesthesia Transfer of Care Note  Patient: Martha [name]  Procedure(s) Performed: LEFT ARTERIOVENOUS (AV) FISTULA CREATION (Left Arm Upper)  Patient Location: PACU  Anesthesia Type:General  Level of Consciousness: awake, alert , oriented and patient cooperative  Airway & Oxygen Therapy: Patient spontaneously breathing and connected to face mask oxygen.  Post-op Assessment: Report given to RN and Post -op Vital signs reviewed and stable  Post vital signs: Reviewed and stable  Last Vitals:  Vitals Value Taken Time  BP 154/80 08/03/2018 11:43 AM  Temp    Pulse    Resp 7 08/03/2018 11:43 AM  SpO2    Vitals shown include unvalidated device data.  Last Pain:  Vitals:   08/03/18 0805  TempSrc: Oral  PainSc:       Patients Stated Pain Goal: 3 (43/83/77 9396)  Complications: No apparent anesthesia complications

## 2018-08-03 NOTE — Telephone Encounter (Signed)
Needs ov next available appt.

## 2018-08-03 NOTE — Anesthesia Preprocedure Evaluation (Addendum)
Anesthesia Evaluation  Patient identified by MRN, date of birth, ID band Patient awake    Reviewed: Allergy & Precautions, NPO status , Patient's Chart, lab work & pertinent test results  History of Anesthesia Complications Negative for: history of anesthetic complications  Airway Mallampati: II  TM Distance: >3 FB Neck ROM: Full    Dental  (+) Chipped Multiple chipped front teeth:   Pulmonary shortness of breath, sleep apnea , COPD, former smoker,    Pulmonary exam normal        Cardiovascular hypertension, Normal cardiovascular exam  TTE 04/07/17: EF 60-65%, mild LVH, no significant valve abnormality   Neuro/Psych PSYCHIATRIC DISORDERS Anxiety Depression Bipolar Disorder negative neurological ROS     GI/Hepatic Neg liver ROS, GERD  ,  Endo/Other  diabetesMorbid obesity  Renal/GU ESRF and DialysisRenal disease  negative genitourinary   Musculoskeletal negative musculoskeletal ROS (+)   Abdominal (+) + obese,   Peds  Hematology  (+) anemia ,   Anesthesia Other Findings   Reproductive/Obstetrics                           Anesthesia Physical Anesthesia Plan  ASA: IV  Anesthesia Plan: General   Post-op Pain Management:    Induction: Intravenous  PONV Risk Score and Plan: 3 and Ondansetron, Dexamethasone, Midazolam and Treatment may vary due to age or medical condition  Airway Management Planned: Oral ETT  Additional Equipment: None  Intra-op Plan:   Post-operative Plan: Extubation in OR  Informed Consent: I have reviewed the patients History and Physical, chart, labs and discussed the procedure including the risks, benefits and alternatives for the proposed anesthesia with the patient or authorized representative who has indicated his/her understanding and acceptance.   Dental advisory given  Plan Discussed with:   Anesthesia Plan Comments:        Anesthesia Quick  Evaluation

## 2018-08-03 NOTE — Op Note (Signed)
Procedure: Left first stage basilic vein fistula  Preoperative diagnosis: End-stage renal disease  Postoperative diagnosis: Same  Anesthesia: General  Assistant: Gerri Lins, PA-C  Operative findings: #1 3 mm basilic vein  Operative details: After pain informed consent, patient was taken the operating.  The patient was placed in supine position operating table.  After induction of general anesthesia the patient's entire left upper extremities prepped and draped in usual sterile fashion.  Ultrasound was used to identify the patient's left basilic vein.  Incision was made over this near the antecubital crease.  Incision was extended down on the left forearm to try to preserve basilic vein length.  The basilic vein was about 3 mm in diameter.  It was dissected free circumferentially and small side branches ligated by 2 in silk ties.  Next the brachial artery was dissected free and an additional longitudinal incision just medial to the previously placed incision over the antecubital area.  The incision was carried on through the subcutaneous tissues down the level of brachial artery.  Several small venous tributary branches were ligated divided to insert ties.  The artery was dissected free over several centimeters.  It was about 2-1/2 to 3 mm in diameter.  Patient was given 5000 units of intravenous heparin.  The vein was transected distally and swung over to the level of the artery underneath the skin bridge with a tunnel.  This was placed subcutaneously.  The artery was controlled proximally distally with Vesseloops.  A longitudinal opening was made in the artery and the vein sewn end of vein to side of artery using a running 7-0 Prolene suture.  Just prior to completion of the anastomosis it was forebled backbled and thoroughly flushed anastomosis was secured clamps released there is palpable thrill in the fistula immediately.  I mobilized some of the subcutaneous tissue to make sure that the fistula  had a good leg without any kinks in it.  There was an excellent thrill.  There was no palpable radial pulse.  The hand was pink.  The subcutaneous tissues of both incisions were reapproximated with running 3-0 Vicryl suture after obtaining hemostasis.  The skin of both incisions was closed with a 4-0 Vicryl subcuticular stitch.  Dermabond was applied to both incisions.  The patient tired procedure well and there were no complications.  The instrument sponge and needle count was correct at the end of the case.  The patient was taken the recovery room in stable condition.  Ruta Hinds, MD [facility] [place] [facility] Office: [phone] Pager: [phone]

## 2018-08-03 NOTE — Interval H&P Note (Signed)
History and Physical Interval Note:  08/03/2018 9:33 AM  Martha [name]  has presented today for surgery, with the diagnosis of END STAGE RENAL DISEASE  The various methods of treatment have been discussed with the patient and family. After consideration of risks, benefits and other options for treatment, the patient has consented to  Procedure(s): INSERTION OF ARTERIOVENOUS (AV) GORE-TEX GRAFT ARM (Left) ARTERIOVENOUS (AV) FISTULA CREATION (Left) as a surgical intervention .  The patient's history has been reviewed, patient examined, no change in status, stable for surgery.  I have reviewed the patient's chart and labs.  Questions were answered to the patient's satisfaction.     Ruta Hinds

## 2018-08-03 NOTE — Telephone Encounter (Signed)
-----   Message from Martha [name], [place] sent at 08/03/2018 11:41 AM EST -----  S/P left first stage basilic vein av fistula creation f/u in PA clinic in5-6 weeks with a duplex on a Thursday

## 2018-08-03 NOTE — Anesthesia Procedure Notes (Signed)
Procedure Name: Intubation Date/Time: 08/03/2018 10:17 AM Performed by: Colin Benton, CRNA Pre-anesthesia Checklist: Patient identified, Emergency Drugs available, Suction available and Patient being monitored Patient Re-evaluated:Patient Re-evaluated prior to induction Oxygen Delivery Method: Circle system utilized Preoxygenation: Pre-oxygenation with 100% oxygen Induction Type: IV induction Ventilation: Mask ventilation without difficulty Laryngoscope Size: Mac and 3 Grade View: Grade I Tube type: Oral Tube size: 7.0 mm Number of attempts: 1 Airway Equipment and Method: Stylet Placement Confirmation: ETT inserted through vocal cords under direct vision,  positive ETCO2 and breath sounds checked- equal and bilateral Secured at: 23 cm Tube secured with: Tape Dental Injury: Teeth and Oropharynx as per pre-operative assessment

## 2018-08-03 NOTE — Telephone Encounter (Signed)
sch appt spk to pt mld ltr 09/16/2018 2pm Dialysis duplex 3pm p/o PA

## 2018-08-04 ENCOUNTER — Encounter (HOSPITAL_COMMUNITY): Payer: Self-pay | Admitting: Vascular Surgery

## 2018-08-04 NOTE — Telephone Encounter (Signed)
Patient rescheduled for 08/25/18 @ 2:15 p.m.

## 2018-08-04 NOTE — Telephone Encounter (Signed)
Please schedule pt in next available slot

## 2018-08-10 ENCOUNTER — Encounter: Payer: Self-pay | Admitting: Family

## 2018-08-10 ENCOUNTER — Ambulatory Visit (INDEPENDENT_AMBULATORY_CARE_PROVIDER_SITE_OTHER): Payer: Medicaid Other | Admitting: Family

## 2018-08-10 VITALS — BP 185/94 | HR 69 | Temp 97.8°F | Resp 20 | Ht 67.0 in | Wt 350.0 lb

## 2018-08-10 DIAGNOSIS — L7632 Postprocedural hematoma of skin and subcutaneous tissue following other procedure: Secondary | ICD-10-CM

## 2018-08-10 DIAGNOSIS — N186 End stage renal disease: Secondary | ICD-10-CM

## 2018-08-10 DIAGNOSIS — Z992 Dependence on renal dialysis: Secondary | ICD-10-CM

## 2018-08-10 NOTE — Progress Notes (Signed)
Established Dialysis Access  History of Present Illness  Martha [name] is a 53 y.o. ([birth year]) female who is s/p Left first stage basilic vein fistula creation on 08-03-18 by Dr. Oneida Alar for ESRD on HD.   She is scheduled for 5 weeks follow up on 09-16-18 with fistula duplex and see PA, but states she has to reschedule this as she has another appointment that day.  She returns today with c/o "knot" that appeared 3 days ago at the AV fistula creation site.  She reports steal type sx's in her left hand immediately after the AV fistula creation, and that the steal sx's worsened after the lump formed. She denies injury to her left arm.  She had a previous AVF in her right arm which had to be ligated in November 2019 due to steal sx's, states she still has steal type sx's in her right hand but this has improved, right radial pulse is 1+palpable, left is 1+ palpable.   She dialyzes M-W-F via right IJ TDC.   Her left eye has been surgically removed, pt states due to DM issues.   She is a former smoker, quit in 2009, smoked x 25 years.   She denies chest pain, denies abdominal pain at today's visit. She reports chronic shortness of breath, although she does not appear dyspneic while seated.    Past Medical History:  Diagnosis Date  . Acid reflux    takes Tums  . Anemia   . Arthritis   . Bipolar 1 disorder ([place])   . Blindness of left eye   . Cervical radiculopathy   . CHF (congestive heart failure) ([place])   . Chronic kidney disease    Stage 5- 01/25/17  . Constipation   . COPD (chronic obstructive pulmonary disease) ([place]) 2014   bronchitis  . Degenerative disc disease, thoracic   . Depression   . Diabetes mellitus    Type II  . Diabetic retinopathy ([place])   . Dyspnea    when walking  . End stage kidney disease ([place])    M, W, F [facility]  . Hypertension   . Noncompliance with medication regimen   . Noncompliance with medication regimen   . Obesity (BMI 30-39.9)   . OSA  (obstructive sleep apnea)    cpap  . Panic attack   . RLS (restless legs syndrome)     Social History Social History   Tobacco Use  . Smoking status: Former Smoker    Packs/day: 1.50    Years: 25.00    Pack years: 37.50    Types: Cigarettes    Last attempt to quit: 10/23/2007    Years since quitting: 10.8  . Smokeless tobacco: Never Used  Substance Use Topics  . Alcohol use: No  . Drug use: No    Family History Family History  Adopted: Yes  Family history unknown: Yes    Surgical History Past Surgical History:  Procedure Laterality Date  . AV FISTULA PLACEMENT Right 01/27/2017   Procedure: ARTERIOVENOUS (AV) FISTULA CREATION-RIGHT ARM;  Surgeon: Elam Dutch, MD;  Location[facility] OR;  Service: Vascular;  Laterality: Right;  . AV FISTULA PLACEMENT Left 08/03/2018   Procedure: LEFT ARTERIOVENOUS (AV) FISTULA CREATION;  Surgeon: Elam Dutch, MD;  Location: [place];  Service: Vascular;  Laterality: Left;  . BIOPSY N/A 05/17/2013   Procedure: BIOPSY;  Surgeon: [name], MD;  Location: [facility];  Service: Endoscopy;  Laterality: N/A;  . CHOLECYSTECTOMY N/A 04/27/2014  Procedure: LAPAROSCOPIC CHOLECYSTECTOMY;  Surgeon: [name], MD;  Location: [facility];  Service: General;  Laterality: N/A;  . COLONOSCOPY  06/06/2010   QQV:ZDGLOV bleeding secondary to internal hemorrhoids but incomplete evaluation secondary to poor right colon prep/small rectal and sigmoid colon polyps (hyperplastic). PROPOFOL  . COLONOSCOPY  May 2013   Dr. Gilliam/[facility]: 5 mm a descending colon polyp, hyperplastic. Adequate bowel prep.  . ENDOMETRIAL ABLATION    . 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 ENUCLEATION Left 02/04/2018   Procedure: ENUCLEATION WITH PLACEMENT OF IMPLANT LEFT EYE;  Surgeon: Clista Bernhardt, MD;  Location: [place];  Service: Ophthalmology;  Laterality: Left;  . ESOPHAGOGASTRODUODENOSCOPY  06/06/2010   FIE:PPIRJJO erythema and  edema of body of stomach, with sessile polypoid lesions. bx benign. no h.pylori  . ESOPHAGOGASTRODUODENOSCOPY (EGD) WITH PROPOFOL N/A 05/17/2013   Dr. Oneida Alar: normal esophagus, moderate nodular gastritis, negative path, empiric Savary dilation  . EYE SURGERY  07/2017   sx for glaucoma  . FLEXIBLE SIGMOIDOSCOPY N/A 05/17/2013   Dr. Oneida Alar: moderate sized internal hemorrhoids  . HEMORRHOID BANDING N/A 05/17/2013   Procedure: HEMORRHOID BANDING;  Surgeon: [name], MD;  Location[facility];  Service: Endoscopy;  Laterality: N/A;  2 bands placed  . INCISION AND DRAINAGE ABSCESS Left 10/11/2013   Procedure: INCISION AND DRAINAGE AND DEBRIDEMENT LEFT BREAST  ABSCESS;  Surgeon: [name], MD;  Location: [facility];  Service: General;  Laterality: Left;  . INSERTION OF DIALYSIS CATHETER N/A 06/08/2018   Procedure: INSERTION OF Right internal jugular TUNNELED  DIALYSIS CATHETER;  Surgeon: Angelia Mould, MD;  Location: [place];  Service: Vascular;  Laterality: N/A;  . IRRIGATION AND DEBRIDEMENT ABSCESS Right 06/01/2013   Procedure: INCISION AND DRAINAGE AND DEBRIDEMENT ABSCESS RIGHT BREAST;  Surgeon: [name], MD;  Location[facility];  Service: General;  Laterality: Right;  . LEFT EYE REMOVED Left 01/2018   [facility] [address].  Marland Kitchen LIGATION OF ARTERIOVENOUS  FISTULA Right 06/08/2018   Procedure: LIGATION OF ARTERIOVENOUS  FISTULA RIGHT ARM;  Surgeon: Angelia Mould, MD;  Location: [place];  Service: Vascular;  Laterality: Right;  . SAVORY DILATION N/A 05/17/2013   Procedure: SAVORY DILATION;  Surgeon: [name], MD;  Location[facility];  Service: Endoscopy;  Laterality: N/A;  14/15/16  . SKIN FULL THICKNESS GRAFT Left 05/05/2018   Procedure: ABDOMINAL DERMIS FAT SKIN GRAFT FULL THICKNESS LEFT EYE;  Surgeon: Clista Bernhardt, MD;  Location: [place];  Service: Ophthalmology;  Laterality: Left;  . TUBAL LIGATION      Allergies  Allergen Reactions  .  Codeine Nausea And Vomiting  . Tape Rash    Pull skin off.  Paper tape is ok    Current Outpatient Medications  Medication Sig Dispense Refill  . albuterol (PROVENTIL) (2.5 MG/3ML) 0.083% nebulizer solution Take 2.5 mg by nebulization 4 (four) times daily.    Marland Kitchen ALPRAZolam (XANAX) 1 MG tablet Take 1 mg by mouth 4 (four) times daily.     Marland Kitchen atorvastatin (LIPITOR) 20 MG tablet Take 20 mg by mouth daily.     . busPIRone (BUSPAR) 7.5 MG tablet Take 7.5 mg by mouth 2 (two) times daily.    . calcium carbonate (TUMS EX) 750 MG chewable tablet Chew 1-3 tablets by mouth See admin instructions. Patient takes 3 tablets with meals and 1 tablet with snacks three times a day    . cefUROXime (CEFTIN)  250 MG tablet Take 2 tablets after each dialysis x5 occasions 10 tablet 0  . colchicine 0.6 MG tablet Take 1 tablet (0.6 mg total) by mouth every other day. (Patient taking differently: Take 0.6 mg by mouth every other day. Take 1 tablet every other day) 16 tablet 12  . dicyclomine (BENTYL) 10 MG capsule Take 1 capsule (10 mg total) by mouth 4 (four) times daily -  before meals and at bedtime. (Patient taking differently: Take 10 mg by mouth 4 (four) times daily. ) 120 capsule 3  . gabapentin (NEURONTIN) 300 MG capsule Take 300 mg by mouth at bedtime.     Marland Kitchen HYDROcodone-acetaminophen (NORCO) 10-325 MG tablet Take 1 tablet by mouth every 6 (six) hours as needed for moderate pain. 6 tablet 0  . Insulin Glargine (LANTUS SOLOSTAR) 100 UNIT/ML Solostar Pen Inject 30 Units into the skin every morning. And pen needles 1/day 15 mL 11  . lidocaine-prilocaine (EMLA) cream Apply 1 application topically as needed (topical anesthesia for hemodialysis if Gebauers and Lidocaine injection are ineffective.). 30 g 0  . losartan (COZAAR) 25 MG tablet Take 25 mg by mouth daily.    Marland Kitchen PARoxetine (PAXIL) 20 MG tablet Take 1 daily. This is to prevent panic attacks (Patient taking differently: Take 20-40 mg by mouth daily. This is to prevent  panic attacks) 30 tablet 12  . torsemide (DEMADEX) 100 MG tablet Take 100 mg by mouth See admin instructions. TAKE ONE TABLET BY MOUTH ON NON DIALYSIS DAYS ONLY-     No current facility-administered medications for this visit.      REVIEW OF SYSTEMS: see HPI for pertinent positives and negatives    PHYSICAL EXAMINATION:  Vitals:   08/10/18 0906  BP: (!) 185/94  Pulse: 69  Resp: 20  Temp: 97.8 F (36.6 C)  SpO2: 97%  Weight: (!) 350 lb (158.8 kg)  Height: 5\' 7"  (1.702 m)   Body mass index is 54.82 kg/m.  General: Severely morbidly obese female in NAD. HEENT:  Large neck. Left eye is surgically absent. Pulmonary: Respirations are non-labored, distant breath sounds. No rales, rhonchi, or wheezes. Abdomen: Soft and non-tender Musculoskeletal: There are no major deformities.   Neurologic: Hand grasp: right is 5/5, left is 4/5. Moving all extremities equally. Skin: There are no ulcer or rashes noted. See photo below, healing left ac AVF creation, hematoma palpated underneath and distal to incision. Bruising surrounding and distal to incision.  Psychiatric: The patient has normal affect. Cardiovascular: There is a regular rate and rhythm. Bilateral radial pulses are 1+ palpable No palpable thrill over AVF, but audible bruit present   Left arm, ac    Medical Decision Making  Martha [name] is a 53 y.o. female who is s/p Left first stage basilic vein fistula creation on 08-03-18 by Dr. Oneida Alar for ESRD on HD.   Pt c/o "knot" that formed 3 days ago under and distal to the incision, that is causing worsening steal sx's in her left hand. Left radial pulse is 1+ palpable, left hand grip strength is 4/5. Audible bruit over AVF.   Moderate sized hematoma formed, which should resolve within the next 2-4 weeks.   Dr. Donnetta Hutching spoke with and examined pt.  Pt asked what she is going to do about the pain in her left hand. She reports chronic pain in her back and abdomen, and states she  takes hydrocodone 10/325 mg three times/day, prescribed by Dr. Luan Pulling, states she is not in a pain management  clinic.  Dr. Donnetta Hutching encouraged pt to consider a pain management clinic.  Pt states she will not be able to follow up on 09-16-18 as scheduled, with fistula duplex and see PA afterward; she will change the date as she checks out today.   Clemon Chambers, RN, MSN, FNP-C [facility] [place] Office: [phone]  08/10/2018, 9:11 AM  Clinic MD: [facility] [place]

## 2018-08-25 ENCOUNTER — Ambulatory Visit: Payer: Medicaid Other | Admitting: Endocrinology

## 2018-08-25 ENCOUNTER — Telehealth: Payer: Self-pay | Admitting: Endocrinology

## 2018-08-25 NOTE — Telephone Encounter (Signed)
Please schedule f/u appt for next available appointment  

## 2018-08-25 NOTE — Telephone Encounter (Signed)
Patient no showed today's appt. Please advise on how to follow up. °A. No follow up necessary. °B. Follow up urgent. Contact patient immediately. °C. Follow up necessary. Contact patient and schedule visit in ___ days. °D. Follow up advised. Contact patient and schedule visit in ____weeks. ° °Would you like the NS fee to be applied to this visit? ° °

## 2018-08-26 NOTE — Telephone Encounter (Signed)
Please refer to Dr. Ellison's response 

## 2018-08-26 NOTE — Telephone Encounter (Signed)
Rescheduled patient's missed appointment for 09/07/18 at 4:00 p.m.

## 2018-09-07 ENCOUNTER — Ambulatory Visit: Payer: Medicaid Other | Admitting: Endocrinology

## 2018-09-13 ENCOUNTER — Encounter (HOSPITAL_COMMUNITY): Payer: Medicaid Other

## 2018-09-15 ENCOUNTER — Encounter (HOSPITAL_COMMUNITY): Payer: Self-pay | Admitting: Emergency Medicine

## 2018-09-15 ENCOUNTER — Other Ambulatory Visit: Payer: Self-pay

## 2018-09-15 ENCOUNTER — Emergency Department (HOSPITAL_COMMUNITY): Payer: Medicaid Other

## 2018-09-15 ENCOUNTER — Emergency Department (HOSPITAL_COMMUNITY)
Admission: EM | Admit: 2018-09-15 | Discharge: 2018-09-15 | Disposition: A | Discharge status: Home / Self Care | Payer: Medicaid Other | Attending: Emergency Medicine | Admitting: Emergency Medicine

## 2018-09-15 ENCOUNTER — Telehealth: Payer: Self-pay | Admitting: Vascular Surgery

## 2018-09-15 DIAGNOSIS — I770 Arteriovenous fistula, acquired: Secondary | ICD-10-CM | POA: Diagnosis not present

## 2018-09-15 DIAGNOSIS — Z79899 Other long term (current) drug therapy: Secondary | ICD-10-CM | POA: Insufficient documentation

## 2018-09-15 DIAGNOSIS — F419 Anxiety disorder, unspecified: Secondary | ICD-10-CM | POA: Diagnosis not present

## 2018-09-15 DIAGNOSIS — I509 Heart failure, unspecified: Secondary | ICD-10-CM | POA: Diagnosis not present

## 2018-09-15 DIAGNOSIS — Z9049 Acquired absence of other specified parts of digestive tract: Secondary | ICD-10-CM | POA: Insufficient documentation

## 2018-09-15 DIAGNOSIS — I132 Hypertensive heart and chronic kidney disease with heart failure and with stage 5 chronic kidney disease, or end stage renal disease: Secondary | ICD-10-CM | POA: Diagnosis not present

## 2018-09-15 DIAGNOSIS — T82838A Hemorrhage of vascular prosthetic devices, implants and grafts, initial encounter: Secondary | ICD-10-CM | POA: Diagnosis present

## 2018-09-15 DIAGNOSIS — J449 Chronic obstructive pulmonary disease, unspecified: Secondary | ICD-10-CM | POA: Insufficient documentation

## 2018-09-15 DIAGNOSIS — Z87891 Personal history of nicotine dependence: Secondary | ICD-10-CM | POA: Diagnosis not present

## 2018-09-15 DIAGNOSIS — N186 End stage renal disease: Secondary | ICD-10-CM | POA: Diagnosis not present

## 2018-09-15 DIAGNOSIS — F319 Bipolar disorder, unspecified: Secondary | ICD-10-CM | POA: Diagnosis not present

## 2018-09-15 DIAGNOSIS — E1122 Type 2 diabetes mellitus with diabetic chronic kidney disease: Secondary | ICD-10-CM | POA: Diagnosis not present

## 2018-09-15 DIAGNOSIS — Z794 Long term (current) use of insulin: Secondary | ICD-10-CM | POA: Insufficient documentation

## 2018-09-15 DIAGNOSIS — Y829 Unspecified medical devices associated with adverse incidents: Secondary | ICD-10-CM | POA: Insufficient documentation

## 2018-09-15 LAB — CBC WITH DIFFERENTIAL/PLATELET
Abs Immature Granulocytes: 0.05 10*3/uL (ref 0.00–0.07)
Basophils Absolute: 0.1 10*3/uL (ref 0.0–0.1)
Basophils Relative: 1 %
EOS ABS: 0.1 10*3/uL (ref 0.0–0.5)
Eosinophils Relative: 2 %
HCT: 34 % — ABNORMAL LOW (ref 36.0–46.0)
Hemoglobin: 10.6 g/dL — ABNORMAL LOW (ref 12.0–15.0)
Immature Granulocytes: 1 %
Lymphocytes Relative: 26 %
Lymphs Abs: 1.9 10*3/uL (ref 0.7–4.0)
MCH: 27.8 pg (ref 26.0–34.0)
MCHC: 31.2 g/dL (ref 30.0–36.0)
MCV: 89.2 fL (ref 80.0–100.0)
Monocytes Absolute: 0.5 10*3/uL (ref 0.1–1.0)
Monocytes Relative: 6 %
Neutro Abs: 4.7 10*3/uL (ref 1.7–7.7)
Neutrophils Relative %: 64 %
PLATELETS: 124 10*3/uL — AB (ref 150–400)
RBC: 3.81 MIL/uL — ABNORMAL LOW (ref 3.87–5.11)
RDW: 15.8 % — ABNORMAL HIGH (ref 11.5–15.5)
WBC: 7.3 10*3/uL (ref 4.0–10.5)
nRBC: 0 % (ref 0.0–0.2)

## 2018-09-15 LAB — COMPREHENSIVE METABOLIC PANEL
ALT: 14 U/L (ref 0–44)
AST: 11 U/L — ABNORMAL LOW (ref 15–41)
Albumin: 3.2 g/dL — ABNORMAL LOW (ref 3.5–5.0)
Alkaline Phosphatase: 77 U/L (ref 38–126)
Anion gap: 13 (ref 5–15)
BUN: 12 mg/dL (ref 6–20)
CALCIUM: 8.9 mg/dL (ref 8.9–10.3)
CO2: 25 mmol/L (ref 22–32)
Chloride: 100 mmol/L (ref 98–111)
Creatinine, Ser: 3.15 mg/dL — ABNORMAL HIGH (ref 0.44–1.00)
GFR calc Af Amer: 19 mL/min — ABNORMAL LOW (ref >60)
GFR calc non Af Amer: 16 mL/min — ABNORMAL LOW (ref >60)
Glucose, Bld: 265 mg/dL — ABNORMAL HIGH (ref 70–99)
Potassium: 3.2 mmol/L — ABNORMAL LOW (ref 3.5–5.1)
Sodium: 138 mmol/L (ref 135–145)
TOTAL PROTEIN: 7.2 g/dL (ref 6.5–8.1)
Total Bilirubin: 0.6 mg/dL (ref 0.3–1.2)

## 2018-09-15 LAB — LACTIC ACID, PLASMA: Lactic Acid, Venous: 1.8 mmol/L (ref 0.5–1.9)

## 2018-09-15 MED ORDER — SODIUM CHLORIDE 0.9% FLUSH: 3.0000 mL | Freq: Once | INTRAVENOUS | Status: DC

## 2018-09-15 NOTE — ED Triage Notes (Signed)
Pt was called by Dr. Oneida Alar and told to come to the hospital for possible infection to her dialysis graft.

## 2018-09-15 NOTE — ED Notes (Signed)
Pt verbalized understanding of discharge instructions and denies any further questions at this time.   

## 2018-09-15 NOTE — Discharge Instructions (Signed)
Please follow up with Dr. Oneida Alar

## 2018-09-15 NOTE — ED Provider Notes (Signed)
[facility] EMERGENCY DEPARTMENT Provider Note   CSN: [id number] Arrival date & time: 09/15/18  1300    History   Chief Complaint Chief Complaint  Patient presents with  . Possible Infection    HPI Martha [name] is a 53 y.o. female.     Patient states that her AVF site has been bleeding since Sunday. She states that she went to her regularly scheduled HD this morning, completed the entire session, but was told to come to ER for evaluation of the fistula. She denies any erythema or drainage. No fever/chills. She states that she has no other complaints.      Past Medical History:  Diagnosis Date  . Acid reflux    takes Tums  . Anemia   . Arthritis   . Bipolar 1 disorder ([place])   . Blindness of left eye   . Cervical radiculopathy   . CHF (congestive heart failure) ([place])   . Chronic kidney disease    Stage 5- 01/25/17  . Constipation   . COPD (chronic obstructive pulmonary disease) ([place]) 2014   bronchitis  . Degenerative disc disease, thoracic   . Depression   . Diabetes mellitus    Type II  . Diabetic retinopathy ([place])   . Dyspnea    when walking  . End stage kidney disease ([place])    M, W, F [facility]  . Hypertension   . Noncompliance with medication regimen   . Noncompliance with medication regimen   . Obesity (BMI 30-39.9)   . OSA (obstructive sleep apnea)    cpap  . Panic attack   . RLS (restless legs syndrome)     Patient Active Problem List   Diagnosis Date Noted  . SIRS (systemic inflammatory response syndrome) ([place]) 06/23/2018  . UTI (urinary tract infection) 02/26/2018  . Volume overload 02/25/2018  . 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 Dialysis patient ([place]) 12/13/2017  . ESRD (end stage renal disease) on dialysis ([place])   . Chronic diarrhea 10/08/2017  . Renal failure (ARF), acute on chronic (HCC) 04/22/2017  . Anasarca associated with disorder of kidney 04/20/2017  . Protein-calorie malnutrition,  severe 11/29/2016  . HTN (hypertension), malignant 11/20/2016  . Bipolar 1 disorder ([place]) 11/20/2016  . Noncompliance with medication regimen 11/20/2016  . Panic attack 11/20/2016  . Uncontrolled type 2 diabetes mellitus with hyperglycemia, without long-term current use of insulin ([place]) 10/27/2016  . Sensory disturbance 10/27/2016  . Left hemiparesis ([place]) 10/27/2016  . Cholelithiasis 04/26/2014  . Hyponatremia 04/26/2014  . Gallstones 04/06/2014  . Diabetic ulcer of left great toe ([place]) 10/09/2013  . Diabetes mellitus, type 2 ([place]) 05/30/2013  . Hyperglycemia 05/30/2013  . Internal hemorrhoids with other complication 40/98/1191  . Umbilical hernia without mention of obstruction or gangrene 04/15/2013  . Anemia 04/15/2013  . DM 05/13/2010  . Hyperlipidemia 05/13/2010  . Morbid obesity ([place]) 05/13/2010  . Depression with anxiety 05/13/2010  . RESTLESS LEG SYNDROME 05/13/2010  . Essential hypertension 05/13/2010  . GERD 05/13/2010  . RECTAL BLEEDING 05/13/2010  . Sleep apnea 05/13/2010  . Dysphagia, oropharyngeal phase 05/13/2010    Past Surgical History:  Procedure Laterality Date  . AV FISTULA PLACEMENT Right 01/27/2017   Procedure: ARTERIOVENOUS (AV) FISTULA CREATION-RIGHT ARM;  Surgeon: Elam Dutch, MD;  Location: [facility] OR;  Service: Vascular;  Laterality: Right;  . AV FISTULA PLACEMENT Left 08/03/2018   Procedure: LEFT ARTERIOVENOUS (AV) FISTULA CREATION;  Surgeon: Elam Dutch, MD;  Location:  [facility] OR;  Service: Vascular;  Laterality: Left;  . BIOPSY N/A 05/17/2013   Procedure: BIOPSY;  Surgeon: [name], MD;  Location: [facility] ORS;  Service: Endoscopy;  Laterality: N/A;  . CHOLECYSTECTOMY N/A 04/27/2014   Procedure: LAPAROSCOPIC CHOLECYSTECTOMY;  Surgeon: [name], MD;  Location: [facility] ORS;  Service: General;  Laterality: N/A;  . COLONOSCOPY  06/06/2010   [id number] bleeding secondary to internal hemorrhoids but incomplete evaluation secondary to poor right colon  prep/small rectal and sigmoid colon polyps (hyperplastic). PROPOFOL  . COLONOSCOPY  May 2013   Dr. Gilliam/[facility]: 5 mm a descending colon polyp, hyperplastic. Adequate bowel prep.  . ENDOMETRIAL ABLATION    . 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 ENUCLEATION Left 02/04/2018   Procedure: ENUCLEATION WITH PLACEMENT OF IMPLANT LEFT EYE;  Surgeon: Clista Bernhardt, MD;  Location: [facility];  Service: Ophthalmology;  Laterality: Left;  . ESOPHAGOGASTRODUODENOSCOPY  06/06/2010   [id number] erythema and edema of body of stomach, with sessile polypoid lesions. bx benign. no h.pylori  . ESOPHAGOGASTRODUODENOSCOPY (EGD) WITH PROPOFOL N/A 05/17/2013   Dr. Oneida Alar: normal esophagus, moderate nodular gastritis, negative path, empiric Savary dilation  . EYE SURGERY  07/2017   sx for glaucoma  . FLEXIBLE SIGMOIDOSCOPY N/A 05/17/2013   Dr. Oneida Alar: moderate sized internal hemorrhoids  . HEMORRHOID BANDING N/A 05/17/2013   Procedure: HEMORRHOID BANDING;  Surgeon: [name], MD;  Location[facility];  Service: Endoscopy;  Laterality: N/A;  2 bands placed  . INCISION AND DRAINAGE ABSCESS Left 10/11/2013   Procedure: INCISION AND DRAINAGE AND DEBRIDEMENT LEFT BREAST  ABSCESS;  Surgeon: [name], MD;  Location: [facility];  Service: General;  Laterality: Left;  . INSERTION OF DIALYSIS CATHETER N/A 06/08/2018   Procedure: INSERTION OF Right internal jugular TUNNELED  DIALYSIS CATHETER;  Surgeon: Angelia Mould, MD;  Location: [facility];  Service: Vascular;  Laterality: N/A;  . IRRIGATION AND DEBRIDEMENT ABSCESS Right 06/01/2013   Procedure: INCISION AND DRAINAGE AND DEBRIDEMENT ABSCESS RIGHT BREAST;  Surgeon: [name], MD;  Location: [facility];  Service: General;  Laterality: Right;  . LEFT EYE REMOVED Left 01/2018   [facility] on [address].  Marland Kitchen LIGATION OF ARTERIOVENOUS  FISTULA Right 06/08/2018   Procedure: LIGATION OF ARTERIOVENOUS  FISTULA  RIGHT ARM;  Surgeon: Angelia Mould, MD;  Location: [facility];  Service: Vascular;  Laterality: Right;  . SAVORY DILATION N/A 05/17/2013   Procedure: SAVORY DILATION;  Surgeon: [name], MD;  Location: [facility];  Service: Endoscopy;  Laterality: N/A;  14/15/16  . SKIN FULL THICKNESS GRAFT Left 05/05/2018   Procedure: ABDOMINAL DERMIS FAT SKIN GRAFT FULL THICKNESS LEFT EYE;  Surgeon: Clista Bernhardt, MD;  Location: [facility];  Service: Ophthalmology;  Laterality: Left;  . TUBAL LIGATION       OB History    Gravida  2   Para  2   Term  1   Preterm  1   AB      Living  2     SAB      TAB      Ectopic      Multiple      Live Births               Home Medications    Prior to Admission medications   Medication Sig Start Date End Date Taking? Authorizing Provider  albuterol (PROVENTIL) (2.5 MG/3ML) 0.083% nebulizer solution Take  2.5 mg by nebulization 4 (four) times daily.    [provider]  ALPRAZolam Duanne Moron) 1 MG tablet Take 1 mg by mouth 4 (four) times daily.     [provider]  atorvastatin (LIPITOR) 20 MG tablet Take 20 mg by mouth daily.     [provider]  busPIRone (BUSPAR) 7.5 MG tablet Take 7.5 mg by mouth 2 (two) times daily.    [provider]  calcium carbonate (TUMS EX) 750 MG chewable tablet Chew 1-3 tablets by mouth See admin instructions. Patient takes 3 tablets with meals and 1 tablet with snacks three times a day    [provider]  cefUROXime (CEFTIN) 250 MG tablet Take 2 tablets after each dialysis x5 occasions 06/23/18   Sinda Du, MD  colchicine 0.6 MG tablet Take 1 tablet (0.6 mg total) by mouth every other day. Patient taking differently: Take 0.6 mg by mouth every other day. Take 1 tablet every other day 11/19/16   Sinda Du, MD  dicyclomine (BENTYL) 10 MG capsule Take 1 capsule (10 mg total) by mouth 4 (four) times daily -  before meals and at bedtime. Patient taking differently:  Take 10 mg by mouth 4 (four) times daily.  10/08/17   Annitta Needs, NP  gabapentin (NEURONTIN) 300 MG capsule Take 300 mg by mouth at bedtime.     [provider]  HYDROcodone-acetaminophen (NORCO) 10-325 MG tablet Take 1 tablet by mouth every 6 (six) hours as needed for moderate pain. 08/03/18   Ulyses Amor, PA-C  Insulin Glargine (LANTUS SOLOSTAR) 100 UNIT/ML Solostar Pen Inject 30 Units into the skin every morning. And pen needles 1/day 06/29/18   Renato Shin, MD  lidocaine-prilocaine (EMLA) cream Apply 1 application topically as needed (topical anesthesia for hemodialysis if Gebauers and Lidocaine injection are ineffective.). 04/25/17   Johnson, Clanford L, MD  losartan (COZAAR) 25 MG tablet Take 25 mg by mouth daily.    [provider]  PARoxetine (PAXIL) 20 MG tablet Take 1 daily. This is to prevent panic attacks Patient taking differently: Take 20-40 mg by mouth daily. This is to prevent panic attacks 10/28/16   Sinda Du, MD  torsemide (DEMADEX) 100 MG tablet Take 100 mg by mouth See admin instructions. TAKE ONE TABLET BY MOUTH ON NON DIALYSIS DAYS ONLY-    [provider]    Family History Family History  Adopted: Yes  Family history unknown: Yes    Social History Social History   Tobacco Use  . Smoking status: Former Smoker    Packs/day: 1.50    Years: 25.00    Pack years: 37.50    Types: Cigarettes    Last attempt to quit: 10/23/2007    Years since quitting: 10.9  . Smokeless tobacco: Never Used  Substance Use Topics  . Alcohol use: No  . Drug use: No     Allergies   Codeine and Tape   Review of Systems Review of Systems  Constitutional: Negative for chills and fever.  HENT: Negative for congestion and sore throat.   Respiratory: Negative for cough and shortness of breath.   Cardiovascular: Negative for chest pain.  Gastrointestinal: Negative for abdominal pain, diarrhea and vomiting.  Genitourinary: Negative for dysuria,  frequency, hematuria and urgency.  Skin:       Bleeding from AVF     Physical Exam Updated Vital Signs BP 131/77 (BP Location: Right Arm)   Pulse 74   Temp 98.2 F (36.8 C) (Oral)  Resp 16   Ht 5\' 8"  (1.727 m)   Wt (!) 158.8 kg   LMP 08/03/2008 (Exact Date)   SpO2 96%   BMI 53.22 kg/m   Physical Exam Constitutional:      General: She is not in acute distress.    Appearance: Normal appearance. She is obese. She is not ill-appearing or diaphoretic.  Cardiovascular:     Rate and Rhythm: Normal rate and regular rhythm.     Heart sounds: No murmur.     Comments: L arm AVF site with minimal dried blood on dressing. Upon removal of dressing, scant bleeding appreciated. No surrounding erythema. No drainage. No TTP. Pulmonary:     Effort: Pulmonary effort is normal.     Breath sounds: Normal breath sounds.  Abdominal:     General: Bowel sounds are normal.     Tenderness: There is no abdominal tenderness. There is no guarding or rebound.  Skin:    Findings: No rash.  Neurological:     Mental Status: She is alert.      ED Treatments / Results  Labs (all labs ordered are listed, but only abnormal results are displayed) Labs Reviewed  COMPREHENSIVE METABOLIC PANEL - Abnormal; Notable for the following components:      Result Value   Potassium 3.2 (*)    Glucose, Bld 265 (*)    Creatinine, Ser 3.15 (*)    Albumin 3.2 (*)    AST 11 (*)    GFR calc non Af Amer 16 (*)    GFR calc Af Amer 19 (*)    All other components within normal limits  CBC WITH DIFFERENTIAL/PLATELET - Abnormal; Notable for the following components:   RBC 3.81 (*)    Hemoglobin 10.6 (*)    HCT 34.0 (*)    RDW 15.8 (*)    Platelets 124 (*)    All other components within normal limits  LACTIC ACID, PLASMA  LACTIC ACID, PLASMA  URINALYSIS, ROUTINE W REFLEX MICROSCOPIC    EKG None  Radiology Dg Chest 2 View  Result Date: 09/15/2018 CLINICAL DATA:  53 y/o  F; rule out infection. EXAM: CHEST - 2  VIEW COMPARISON:  07/26/2018 chest radiograph FINDINGS: Stable cardiac silhouette within normal limits given projection and technique. Right central venous catheter tip projects over lower SVC and is stable. No focal consolidation. No pleural effusion or pneumothorax. No acute osseous abnormality is evident. IMPRESSION: No acute pulmonary process identified. Electronically Signed   By: Kristine Garbe M.D.   On: 09/15/2018 13:57    Procedures Procedures (including critical care time)  Medications Ordered in ED Medications  sodium chloride flush (NS) 0.9 % injection 3 mL (has no administration in time range)     Initial Impression / Assessment and Plan / ED Course  I have reviewed the triage vital signs and the nursing notes.  Pertinent labs & imaging results that were available during my care of the patient were reviewed by me and considered in my medical decision making (see chart for details).       Patient was sent here from HD due to concerns of her AVF site. She had full session this morning and upon removal of removal has only scant very superficial bleeding. Her hgb is stable at 10.6 which is her baseline. She is afebrile, well appearing, WBC WNL. Her AVF does not appear to be infected. Spoke with Dr. Oneida Alar, patient set up for outpatient office visit with him for tomorrow.   Final  Clinical Impressions(s) / ED Diagnoses   Final diagnoses:  AVF (arteriovenous fistula) [facility])    ED Discharge Orders    None       Bufford Lope, DO 09/15/18 [place], [place], DO 09/15/18 1537

## 2018-09-16 ENCOUNTER — Encounter (HOSPITAL_COMMUNITY): Payer: Medicaid Other

## 2018-09-17 ENCOUNTER — Other Ambulatory Visit: Payer: Self-pay

## 2018-09-17 DIAGNOSIS — N186 End stage renal disease: Secondary | ICD-10-CM

## 2018-09-17 DIAGNOSIS — Z992 Dependence on renal dialysis: Principal | ICD-10-CM

## 2018-09-23 ENCOUNTER — Other Ambulatory Visit: Payer: Self-pay

## 2018-09-23 ENCOUNTER — Ambulatory Visit (HOSPITAL_COMMUNITY)
Admission: RE | Admit: 2018-09-23 | Discharge: 2018-09-23 | Disposition: A | Discharge status: Home / Self Care | Payer: Medicaid Other | Source: Ambulatory Visit | Attending: Vascular Surgery | Admitting: Vascular Surgery

## 2018-09-23 ENCOUNTER — Ambulatory Visit (INDEPENDENT_AMBULATORY_CARE_PROVIDER_SITE_OTHER): Payer: Medicaid Other | Admitting: Vascular Surgery

## 2018-09-23 ENCOUNTER — Encounter: Payer: Self-pay | Admitting: Vascular Surgery

## 2018-09-23 VITALS — BP 171/91 | HR 74 | Temp 97.1°F | Resp 18 | Ht 68.0 in | Wt 350.0 lb

## 2018-09-23 DIAGNOSIS — Z992 Dependence on renal dialysis: Secondary | ICD-10-CM | POA: Diagnosis not present

## 2018-09-23 DIAGNOSIS — N186 End stage renal disease: Secondary | ICD-10-CM

## 2018-09-23 NOTE — Progress Notes (Signed)
Patient is a 53 year old female who returns for follow-up today.  She recently had a bleeding episode from 1 of her incisions adjacent to her fistula.  She was seen in the ER and the bleeding had stopped by the time she arrived.  She currently has a first stage basilic vein transposition fistula.  This has not yet been superficial lysed.  She is currently dialyzing via right sided catheter.  Patient continues to complain of numbness and tingling in digits 1 2 and 3 on her left hand.  This occurs primarily after being on dialysis for more than a few hours.  It then takes almost a day for the numbness and tingling to go away.  Previously had ligation of a right arm AV fistula for steal.    Review of systems: She denies shortness of breath.  She denies chest pain.  Social History   Socioeconomic History  . Marital status: Divorced    Spouse name: Not on file  . Number of children: 2  . Years of education: Not on file  . Highest education level: Not on file  Occupational History  . Occupation: Disabled  Social Needs  . Financial resource strain: Not on file  . Food insecurity:    Worry: Not on file    Inability: Not on file  . Transportation needs:    Medical: Not on file    Non-medical: Not on file  Tobacco Use  . Smoking status: Former Smoker    Packs/day: 1.50    Years: 25.00    Pack years: 37.50    Types: Cigarettes    Last attempt to quit: 10/23/2007    Years since quitting: 10.9  . Smokeless tobacco: Never Used  Substance and Sexual Activity  . Alcohol use: No  . Drug use: No  . Sexual activity: Yes    Birth control/protection: Surgical  Lifestyle  . Physical activity:    Days per week: Not on file    Minutes per session: Not on file  . Stress: Not on file  Relationships  . Social connections:    Talks on phone: Not on file    Gets together: Not on file    Attends religious service: Not on file    Active member of club or organization: Not on file    Attends meetings  of clubs or organizations: Not on file    Relationship status: Not on file  . Intimate partner violence:    Fear of current or ex partner: Not on file    Emotionally abused: Not on file    Physically abused: Not on file    Forced sexual activity: Not on file  Other Topics Concern  . Not on file  Social History Narrative   Lives with fiance.       Past Medical History:  Diagnosis Date  . Acid reflux    takes Tums  . Anemia   . Arthritis   . Bipolar 1 disorder ([place])   . Blindness of left eye   . Cervical radiculopathy   . CHF (congestive heart failure) ([place])   . Chronic kidney disease    Stage 5- 01/25/17  . Constipation   . COPD (chronic obstructive pulmonary disease) ([place]) 2014   bronchitis  . Degenerative disc disease, thoracic   . Depression   . Diabetes mellitus    Type II  . Diabetic retinopathy ([place])   . Dyspnea    when walking  . End stage kidney disease ([place])  M, W, F [facility]  . Hypertension   . Noncompliance with medication regimen   . Noncompliance with medication regimen   . Obesity (BMI 30-39.9)   . OSA (obstructive sleep apnea)    cpap  . Panic attack   . RLS (restless legs syndrome)     Past Surgical History:  Procedure Laterality Date  . AV FISTULA PLACEMENT Right 01/27/2017   Procedure: ARTERIOVENOUS (AV) FISTULA CREATION-RIGHT ARM;  Surgeon: Elam Dutch, MD;  Location: [facility] OR;  Service: Vascular;  Laterality: Right;  . AV FISTULA PLACEMENT Left 08/03/2018   Procedure: LEFT ARTERIOVENOUS (AV) FISTULA CREATION;  Surgeon: Elam Dutch, MD;  Location: [facility];  Service: Vascular;  Laterality: Left;  . BIOPSY N/A 05/17/2013   Procedure: BIOPSY;  Surgeon: [name], MD;  [facility];  Service: Endoscopy;  Laterality: N/A;  . CHOLECYSTECTOMY N/A 04/27/2014   Procedure: LAPAROSCOPIC CHOLECYSTECTOMY;  Surgeon: [name], MD;  [facility];  Service: General;  Laterality: N/A;  . COLONOSCOPY  06/06/2010   [id number]  bleeding secondary to internal hemorrhoids but incomplete evaluation secondary to poor right colon prep/small rectal and sigmoid colon polyps (hyperplastic). PROPOFOL  . COLONOSCOPY  May 2013   Dr. Gilliam/[facility]: 5 mm a descending colon polyp, hyperplastic. Adequate bowel prep.  . ENDOMETRIAL ABLATION    . 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 ENUCLEATION Left 02/04/2018   Procedure: ENUCLEATION WITH PLACEMENT OF IMPLANT LEFT EYE;  Surgeon: Clista Bernhardt, MD;  Location: [facility];  Service: Ophthalmology;  Laterality: Left;  . ESOPHAGOGASTRODUODENOSCOPY  06/06/2010   YIR:[id number] erythema and edema of body of stomach, with sessile polypoid lesions. bx benign. no h.pylori  . ESOPHAGOGASTRODUODENOSCOPY (EGD) WITH PROPOFOL N/A 05/17/2013   Dr. Oneida Alar: normal esophagus, moderate nodular gastritis, negative path, empiric Savary dilation  . EYE SURGERY  07/2017   sx for glaucoma  . FLEXIBLE SIGMOIDOSCOPY N/A 05/17/2013   Dr. Oneida Alar: moderate sized internal hemorrhoids  . HEMORRHOID BANDING N/A 05/17/2013   Procedure: HEMORRHOID BANDING;  Surgeon: [name], MD;  [facility];  Service: Endoscopy;  Laterality: N/A;  2 bands placed  . INCISION AND DRAINAGE ABSCESS Left 10/11/2013   Procedure: INCISION AND DRAINAGE AND DEBRIDEMENT LEFT BREAST  ABSCESS;  Surgeon: [name], MD;  [facility];  Service: General;  Laterality: Left;  . INSERTION OF DIALYSIS CATHETER N/A 06/08/2018   Procedure: INSERTION OF Right internal jugular TUNNELED  DIALYSIS CATHETER;  Surgeon: Angelia Mould, MD;  Location: [facility];  Service: Vascular;  Laterality: N/A;  . IRRIGATION AND DEBRIDEMENT ABSCESS Right 06/01/2013   Procedure: INCISION AND DRAINAGE AND DEBRIDEMENT ABSCESS RIGHT BREAST;  Surgeon: [name], MD;  [facility];  Service: General;  Laterality: Right;  . LEFT EYE REMOVED Left 01/2018   [facility] on [address].  Marland Kitchen  LIGATION OF ARTERIOVENOUS  FISTULA Right 06/08/2018   Procedure: LIGATION OF ARTERIOVENOUS  FISTULA RIGHT ARM;  Surgeon: Angelia Mould, MD;  Location: [facility];  Service: Vascular;  Laterality: Right;  . SAVORY DILATION N/A 05/17/2013   Procedure: SAVORY DILATION;  Surgeon: [name], MD;  [facility];  Service: Endoscopy;  Laterality: N/A;  14/15/16  . SKIN FULL THICKNESS GRAFT Left 05/05/2018   Procedure: ABDOMINAL DERMIS FAT SKIN GRAFT FULL THICKNESS LEFT EYE;  Surgeon: Clista Bernhardt, MD;  Location: [facility];  Service: Ophthalmology;  Laterality: Left;  .  TUBAL LIGATION     Physical exam:  Vitals:   09/23/18 1548  BP: (!) 171/91  Pulse: 74  Resp: 18  Temp: (!) 97.1 F (36.2 C)  TempSrc: Oral  SpO2: 96%  Weight: (!) 350 lb (158.8 kg)  Height: 5\' 8"  (1.727 m)   Left upper extremity palpable thrill over fistula and well-healed incisions no evidence of erythema or drainage 2+ left radial pulse  Data: Patient had a duplex ultrasound of her AV fistula today.  I reviewed and interpreted the study.  This showed a fistula diameter of 5 to 8 mm.  Depth was 2-1/2 cm in the upper arm.  Assessment: Recent episode of bleeding from an old incision currently no further bleeding or evidence of infection.  The patient's fistula is of a diameter now that we could consider a second stage procedure.  However with the numbness and tingling in her left hand I am reluctant to proceed with this since this may represent some steal symptoms.  In order to try to salvage her fistula we will have her evaluated by hand surgery first to see whether or not a carpal tunnel problem may exist as part of the equation.  Patient will follow-up with me after her hand surgery evaluation to decide whether or not we will ligate the fistula or do a second stage procedure.  Plan: See above  Ruta Hinds, MD [facility] Office: [phone] Pager: [phone]

## 2018-09-26 ENCOUNTER — Emergency Department (HOSPITAL_COMMUNITY): Payer: Medicaid Other

## 2018-09-26 ENCOUNTER — Encounter (HOSPITAL_COMMUNITY): Payer: Self-pay | Admitting: Emergency Medicine

## 2018-09-26 ENCOUNTER — Inpatient Hospital Stay (HOSPITAL_COMMUNITY)
Admission: EM | Admit: 2018-09-26 | Discharge: 2018-09-29 | DRG: 091 | Disposition: A | Discharge status: Home / Self Care | Payer: Medicaid Other | Attending: Family Medicine | Admitting: Family Medicine

## 2018-09-26 ENCOUNTER — Other Ambulatory Visit: Payer: Self-pay

## 2018-09-26 DIAGNOSIS — Z9049 Acquired absence of other specified parts of digestive tract: Secondary | ICD-10-CM

## 2018-09-26 DIAGNOSIS — Z9001 Acquired absence of eye: Secondary | ICD-10-CM

## 2018-09-26 DIAGNOSIS — D631 Anemia in chronic kidney disease: Secondary | ICD-10-CM | POA: Diagnosis present

## 2018-09-26 DIAGNOSIS — F410 Panic disorder [episodic paroxysmal anxiety] without agoraphobia: Secondary | ICD-10-CM | POA: Diagnosis present

## 2018-09-26 DIAGNOSIS — I10 Essential (primary) hypertension: Secondary | ICD-10-CM | POA: Diagnosis present

## 2018-09-26 DIAGNOSIS — I5032 Chronic diastolic (congestive) heart failure: Secondary | ICD-10-CM | POA: Diagnosis present

## 2018-09-26 DIAGNOSIS — J449 Chronic obstructive pulmonary disease, unspecified: Secondary | ICD-10-CM | POA: Diagnosis present

## 2018-09-26 DIAGNOSIS — Z6841 Body Mass Index (BMI) 40.0 and over, adult: Secondary | ICD-10-CM

## 2018-09-26 DIAGNOSIS — Z87891 Personal history of nicotine dependence: Secondary | ICD-10-CM

## 2018-09-26 DIAGNOSIS — G4733 Obstructive sleep apnea (adult) (pediatric): Secondary | ICD-10-CM | POA: Diagnosis present

## 2018-09-26 DIAGNOSIS — G92 Toxic encephalopathy: Principal | ICD-10-CM | POA: Diagnosis present

## 2018-09-26 DIAGNOSIS — E1165 Type 2 diabetes mellitus with hyperglycemia: Secondary | ICD-10-CM | POA: Diagnosis present

## 2018-09-26 DIAGNOSIS — Z992 Dependence on renal dialysis: Secondary | ICD-10-CM

## 2018-09-26 DIAGNOSIS — G9341 Metabolic encephalopathy: Secondary | ICD-10-CM | POA: Diagnosis present

## 2018-09-26 DIAGNOSIS — K219 Gastro-esophageal reflux disease without esophagitis: Secondary | ICD-10-CM | POA: Diagnosis present

## 2018-09-26 DIAGNOSIS — E872 Acidosis: Secondary | ICD-10-CM | POA: Diagnosis present

## 2018-09-26 DIAGNOSIS — I132 Hypertensive heart and chronic kidney disease with heart failure and with stage 5 chronic kidney disease, or end stage renal disease: Secondary | ICD-10-CM | POA: Diagnosis present

## 2018-09-26 DIAGNOSIS — T426X5A Adverse effect of other antiepileptic and sedative-hypnotic drugs, initial encounter: Secondary | ICD-10-CM | POA: Diagnosis present

## 2018-09-26 DIAGNOSIS — T43595A Adverse effect of other antipsychotics and neuroleptics, initial encounter: Secondary | ICD-10-CM | POA: Diagnosis present

## 2018-09-26 DIAGNOSIS — Z79899 Other long term (current) drug therapy: Secondary | ICD-10-CM

## 2018-09-26 DIAGNOSIS — Z888 Allergy status to other drugs, medicaments and biological substances status: Secondary | ICD-10-CM

## 2018-09-26 DIAGNOSIS — Z79891 Long term (current) use of opiate analgesic: Secondary | ICD-10-CM

## 2018-09-26 DIAGNOSIS — G4730 Sleep apnea, unspecified: Secondary | ICD-10-CM | POA: Diagnosis present

## 2018-09-26 DIAGNOSIS — E11319 Type 2 diabetes mellitus with unspecified diabetic retinopathy without macular edema: Secondary | ICD-10-CM | POA: Diagnosis present

## 2018-09-26 DIAGNOSIS — G9340 Encephalopathy, unspecified: Secondary | ICD-10-CM

## 2018-09-26 DIAGNOSIS — E669 Obesity, unspecified: Secondary | ICD-10-CM | POA: Diagnosis present

## 2018-09-26 DIAGNOSIS — G2581 Restless legs syndrome: Secondary | ICD-10-CM | POA: Diagnosis present

## 2018-09-26 DIAGNOSIS — E1122 Type 2 diabetes mellitus with diabetic chronic kidney disease: Secondary | ICD-10-CM | POA: Diagnosis present

## 2018-09-26 DIAGNOSIS — Z885 Allergy status to narcotic agent status: Secondary | ICD-10-CM

## 2018-09-26 DIAGNOSIS — E119 Type 2 diabetes mellitus without complications: Secondary | ICD-10-CM | POA: Diagnosis present

## 2018-09-26 DIAGNOSIS — N2581 Secondary hyperparathyroidism of renal origin: Secondary | ICD-10-CM | POA: Diagnosis present

## 2018-09-26 DIAGNOSIS — F418 Other specified anxiety disorders: Secondary | ICD-10-CM | POA: Diagnosis present

## 2018-09-26 DIAGNOSIS — F319 Bipolar disorder, unspecified: Secondary | ICD-10-CM | POA: Diagnosis present

## 2018-09-26 DIAGNOSIS — N186 End stage renal disease: Secondary | ICD-10-CM | POA: Diagnosis present

## 2018-09-26 DIAGNOSIS — Z9115 Patient's noncompliance with renal dialysis: Secondary | ICD-10-CM

## 2018-09-26 DIAGNOSIS — Z794 Long term (current) use of insulin: Secondary | ICD-10-CM

## 2018-09-26 LAB — CBC
HCT: 30.1 % — ABNORMAL LOW (ref 36.0–46.0)
HEMOGLOBIN: 9.1 g/dL — AB (ref 12.0–15.0)
MCH: 28.4 pg (ref 26.0–34.0)
MCHC: 30.2 g/dL (ref 30.0–36.0)
MCV: 94.1 fL (ref 80.0–100.0)
Platelets: 147 10*3/uL — ABNORMAL LOW (ref 150–400)
RBC: 3.2 MIL/uL — ABNORMAL LOW (ref 3.87–5.11)
RDW: 15.9 % — ABNORMAL HIGH (ref 11.5–15.5)
WBC: 6.1 10*3/uL (ref 4.0–10.5)
nRBC: 0 % (ref 0.0–0.2)

## 2018-09-26 LAB — COMPREHENSIVE METABOLIC PANEL
ALT: 12 U/L (ref 0–44)
AST: 19 U/L (ref 15–41)
Albumin: 3.4 g/dL — ABNORMAL LOW (ref 3.5–5.0)
Alkaline Phosphatase: 87 U/L (ref 38–126)
Anion gap: 14 (ref 5–15)
BUN: 57 mg/dL — ABNORMAL HIGH (ref 6–20)
CO2: 20 mmol/L — ABNORMAL LOW (ref 22–32)
Calcium: 8.6 mg/dL — ABNORMAL LOW (ref 8.9–10.3)
Chloride: 108 mmol/L (ref 98–111)
Creatinine, Ser: 9.65 mg/dL — ABNORMAL HIGH (ref 0.44–1.00)
GFR calc Af Amer: 5 mL/min — ABNORMAL LOW (ref >60)
GFR calc non Af Amer: 4 mL/min — ABNORMAL LOW (ref >60)
Glucose, Bld: 363 mg/dL — ABNORMAL HIGH (ref 70–99)
Potassium: 4.6 mmol/L (ref 3.5–5.1)
Sodium: 142 mmol/L (ref 135–145)
Total Bilirubin: 0.4 mg/dL (ref 0.3–1.2)
Total Protein: 7.1 g/dL (ref 6.5–8.1)

## 2018-09-26 LAB — URINALYSIS, ROUTINE W REFLEX MICROSCOPIC
Bilirubin Urine: NEGATIVE
Glucose, UA: >=500 mg/dL — AB
Ketones, ur: NEGATIVE mg/dL
Nitrite: NEGATIVE
Protein, ur: >=300 mg/dL — AB
Specific Gravity, Urine: 1.014 (ref 1.005–1.030)
Squamous Epithelial / HPF: >50 — ABNORMAL HIGH (ref 0–5)
pH: 6 (ref 5.0–8.0)

## 2018-09-26 LAB — CBC WITH DIFFERENTIAL/PLATELET
ABS IMMATURE GRANULOCYTES: 0.02 10*3/uL (ref 0.00–0.07)
Basophils Absolute: 0 10*3/uL (ref 0.0–0.1)
Basophils Relative: 1 %
Eosinophils Absolute: 0.1 10*3/uL (ref 0.0–0.5)
Eosinophils Relative: 2 %
HCT: 29.7 % — ABNORMAL LOW (ref 36.0–46.0)
Hemoglobin: 9 g/dL — ABNORMAL LOW (ref 12.0–15.0)
Immature Granulocytes: 0 %
Lymphocytes Relative: 23 %
Lymphs Abs: 1.3 10*3/uL (ref 0.7–4.0)
MCH: 28 pg (ref 26.0–34.0)
MCHC: 30.3 g/dL (ref 30.0–36.0)
MCV: 92.5 fL (ref 80.0–100.0)
Monocytes Absolute: 0.2 10*3/uL (ref 0.1–1.0)
Monocytes Relative: 4 %
NRBC: 0 % (ref 0.0–0.2)
Neutro Abs: 4 10*3/uL (ref 1.7–7.7)
Neutrophils Relative %: 70 %
PLATELETS: 148 10*3/uL — AB (ref 150–400)
RBC: 3.21 MIL/uL — ABNORMAL LOW (ref 3.87–5.11)
RDW: 15.8 % — ABNORMAL HIGH (ref 11.5–15.5)
WBC: 5.7 10*3/uL (ref 4.0–10.5)

## 2018-09-26 LAB — RENAL FUNCTION PANEL
Albumin: 3.2 g/dL — ABNORMAL LOW (ref 3.5–5.0)
Anion gap: 15 (ref 5–15)
BUN: 56 mg/dL — ABNORMAL HIGH (ref 6–20)
CO2: 18 mmol/L — ABNORMAL LOW (ref 22–32)
Calcium: 8.8 mg/dL — ABNORMAL LOW (ref 8.9–10.3)
Chloride: 109 mmol/L (ref 98–111)
Creatinine, Ser: 9.67 mg/dL — ABNORMAL HIGH (ref 0.44–1.00)
GFR calc non Af Amer: 4 mL/min — ABNORMAL LOW (ref >60)
GFR, EST AFRICAN AMERICAN: 5 mL/min — AB (ref >60)
Glucose, Bld: 235 mg/dL — ABNORMAL HIGH (ref 70–99)
Phosphorus: 8.8 mg/dL — ABNORMAL HIGH (ref 2.5–4.6)
Potassium: 4.7 mmol/L (ref 3.5–5.1)
Sodium: 142 mmol/L (ref 135–145)

## 2018-09-26 LAB — ETHANOL: Alcohol, Ethyl (B): <10 mg/dL (ref <10)

## 2018-09-26 MED ORDER — SODIUM CHLORIDE 0.9 % IV SOLN
1.0000 g | INTRAVENOUS | Status: DC
  Administered 2018-09-26 – 2018-09-28 (×3): 1 g via INTRAVENOUS
  Filled 2018-09-26 (×3): qty 10

## 2018-09-26 MED ORDER — PENTAFLUOROPROP-TETRAFLUOROETH EX AERO: 1.0000 "application " | INHALATION_SPRAY | CUTANEOUS | Status: DC | PRN

## 2018-09-26 MED ORDER — ONDANSETRON HCL 4 MG/2ML IJ SOLN
4.0000 mg | Freq: Four times a day (QID) | INTRAMUSCULAR | Status: DC | PRN
  Administered 2018-09-27: 4 mg via INTRAVENOUS
  Filled 2018-09-26: qty 2

## 2018-09-26 MED ORDER — INSULIN ASPART 100 UNIT/ML ~~LOC~~ SOLN
0.0000 [IU] | Freq: Three times a day (TID) | SUBCUTANEOUS | Status: DC
  Administered 2018-09-27 (×2): 5 [IU] via SUBCUTANEOUS
  Administered 2018-09-28: 2 [IU] via SUBCUTANEOUS
  Administered 2018-09-28: 3 [IU] via SUBCUTANEOUS
  Administered 2018-09-28 – 2018-09-29 (×3): 2 [IU] via SUBCUTANEOUS

## 2018-09-26 MED ORDER — ACETAMINOPHEN 325 MG PO TABS
650.0000 mg | ORAL_TABLET | Freq: Four times a day (QID) | ORAL | Status: DC | PRN
  Administered 2018-09-27: 650 mg via ORAL
  Filled 2018-09-26 (×2): qty 2

## 2018-09-26 MED ORDER — LIDOCAINE-PRILOCAINE 2.5-2.5 % EX CREA: 1.0000 "application " | TOPICAL_CREAM | CUTANEOUS | Status: DC | PRN

## 2018-09-26 MED ORDER — POLYETHYLENE GLYCOL 3350 17 G PO PACK: 17.0000 g | PACK | Freq: Every day | ORAL | Status: DC | PRN

## 2018-09-26 MED ORDER — HEPARIN SODIUM (PORCINE) 1000 UNIT/ML DIALYSIS: 1000.0000 [IU] | INTRAMUSCULAR | Status: DC | PRN

## 2018-09-26 MED ORDER — LIDOCAINE HCL (PF) 1 % IJ SOLN: 5.0000 mL | INTRAMUSCULAR | Status: DC | PRN

## 2018-09-26 MED ORDER — SODIUM CHLORIDE 0.9 % IV SOLN: 100.0000 mL | INTRAVENOUS | Status: DC | PRN

## 2018-09-26 MED ORDER — COLCHICINE 0.6 MG PO TABS
0.6000 mg | ORAL_TABLET | ORAL | Status: DC
  Administered 2018-09-27 – 2018-09-29 (×2): 0.6 mg via ORAL
  Filled 2018-09-26 (×3): qty 1

## 2018-09-26 MED ORDER — TORSEMIDE 20 MG PO TABS
100.0000 mg | ORAL_TABLET | Freq: Every day | ORAL | Status: DC
  Administered 2018-09-26 – 2018-09-29 (×4): 100 mg via ORAL
  Filled 2018-09-26 (×4): qty 5

## 2018-09-26 MED ORDER — CHLORHEXIDINE GLUCONATE CLOTH 2 % EX PADS
6.0000 | MEDICATED_PAD | Freq: Every day | CUTANEOUS | Status: DC
  Administered 2018-09-28 – 2018-09-29 (×2): 6 via TOPICAL

## 2018-09-26 MED ORDER — DICYCLOMINE HCL 10 MG PO CAPS
10.0000 mg | ORAL_CAPSULE | Freq: Three times a day (TID) | ORAL | Status: DC
  Administered 2018-09-26 – 2018-09-29 (×11): 10 mg via ORAL
  Filled 2018-09-26 (×11): qty 1

## 2018-09-26 MED ORDER — ALBUTEROL SULFATE (2.5 MG/3ML) 0.083% IN NEBU
2.5000 mg | INHALATION_SOLUTION | Freq: Four times a day (QID) | RESPIRATORY_TRACT | Status: DC
  Administered 2018-09-26: 2.5 mg via RESPIRATORY_TRACT
  Filled 2018-09-26: qty 3

## 2018-09-26 MED ORDER — HEPARIN SODIUM (PORCINE) 5000 UNIT/ML IJ SOLN
5000.0000 [IU] | Freq: Three times a day (TID) | INTRAMUSCULAR | Status: DC
  Administered 2018-09-26 – 2018-09-29 (×7): 5000 [IU] via SUBCUTANEOUS
  Filled 2018-09-26 (×7): qty 1

## 2018-09-26 MED ORDER — ONDANSETRON HCL 4 MG PO TABS: 4.0000 mg | ORAL_TABLET | Freq: Four times a day (QID) | ORAL | Status: DC | PRN

## 2018-09-26 MED ORDER — INSULIN GLARGINE 100 UNIT/ML ~~LOC~~ SOLN
30.0000 [IU] | SUBCUTANEOUS | Status: DC
  Administered 2018-09-27 – 2018-09-29 (×3): 30 [IU] via SUBCUTANEOUS
  Filled 2018-09-26 (×4): qty 0.3

## 2018-09-26 MED ORDER — ATORVASTATIN CALCIUM 20 MG PO TABS
20.0000 mg | ORAL_TABLET | Freq: Every day | ORAL | Status: DC
  Administered 2018-09-27 – 2018-09-28 (×2): 20 mg via ORAL
  Filled 2018-09-26 (×2): qty 1

## 2018-09-26 MED ORDER — ALTEPLASE 2 MG IJ SOLR: 2.0000 mg | Freq: Once | INTRAMUSCULAR | Status: DC | PRN

## 2018-09-26 MED ORDER — LOSARTAN POTASSIUM 50 MG PO TABS
25.0000 mg | ORAL_TABLET | Freq: Every day | ORAL | Status: DC
  Administered 2018-09-26 – 2018-09-29 (×4): 25 mg via ORAL
  Filled 2018-09-26 (×4): qty 1

## 2018-09-26 MED ORDER — ACETAMINOPHEN 650 MG RE SUPP: 650.0000 mg | Freq: Four times a day (QID) | RECTAL | Status: DC | PRN

## 2018-09-26 NOTE — ED Provider Notes (Addendum)
[facility] EMERGENCY DEPARTMENT Provider Note   CSN: [id number] Arrival date & time: 09/26/18  1330    History   Chief Complaint Chief Complaint  Patient presents with  . Altered Mental Status    HPI Martha [name] is a 53 y.o. female.     HPI  Level 5 caveat due to altered mental status. Patient brought in for some confusion.  Reportedly at around 9:00 last night has having trouble breathing with some confusion.  Given breathing treatment.  Woke up today at around noon feeling worse.  Some confusion.  She is able to recognize her fianc.  Has had shortness of breath and feels that she needs a breathing treatment.  Does have some confusion somewhat difficult to get history from.  Denies chest pain.  She is a dialysis patient dialyzed on Monday Wednesday Friday with today being Sunday.  States she was not dialyzed on Friday or Wednesday.  Last dialyzed on Monday.  Feels as if she is carrying extra fluid.  No headache. Past Medical History:  Diagnosis Date  . Acid reflux    takes Tums  . Anemia   . Arthritis   . Bipolar 1 disorder ([place])   . Blindness of left eye   . Cervical radiculopathy   . CHF (congestive heart failure) ([place])   . Chronic kidney disease    Stage 5- 01/25/17  . Constipation   . COPD (chronic obstructive pulmonary disease) ([place]) 2014   bronchitis  . Degenerative disc disease, thoracic   . Depression   . Diabetes mellitus    Type II  . Diabetic retinopathy ([place])   . Dyspnea    when walking  . End stage kidney disease ([place])    M, W, F [place]  . Hypertension   . Noncompliance with medication regimen   . Noncompliance with medication regimen   . Obesity (BMI 30-39.9)   . OSA (obstructive sleep apnea)    cpap  . Panic attack   . RLS (restless legs syndrome)     Patient Active Problem List   Diagnosis Date Noted  . SIRS (systemic inflammatory response syndrome) ([place]) 06/23/2018  . UTI (urinary tract infection) 02/26/2018  . Volume  overload 02/25/2018  . 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 Dialysis patient ([place]) 12/13/2017  . ESRD (end stage renal disease) on dialysis ([place])   . Chronic diarrhea 10/08/2017  . Renal failure (ARF), acute on chronic (HCC) 04/22/2017  . Anasarca associated with disorder of kidney 04/20/2017  . Protein-calorie malnutrition, severe 11/29/2016  . HTN (hypertension), malignant 11/20/2016  . Bipolar 1 disorder ([place]) 11/20/2016  . Noncompliance with medication regimen 11/20/2016  . Panic attack 11/20/2016  . Uncontrolled type 2 diabetes mellitus with hyperglycemia, without long-term current use of insulin ([place]) 10/27/2016  . Sensory disturbance 10/27/2016  . Left hemiparesis ([place]) 10/27/2016  . Cholelithiasis 04/26/2014  . Hyponatremia 04/26/2014  . Gallstones 04/06/2014  . Diabetic ulcer of left great toe ([place]) 10/09/2013  . Diabetes mellitus, type 2 ([place]) 05/30/2013  . Hyperglycemia 05/30/2013  . Internal hemorrhoids with other complication 40/02/6760  . Umbilical hernia without mention of obstruction or gangrene 04/15/2013  . Anemia 04/15/2013  . DM 05/13/2010  . Hyperlipidemia 05/13/2010  . Morbid obesity ([place]) 05/13/2010  . Depression with anxiety 05/13/2010  . RESTLESS LEG SYNDROME 05/13/2010  . Essential hypertension 05/13/2010  . GERD 05/13/2010  . RECTAL BLEEDING 05/13/2010  . Sleep apnea 05/13/2010  . Dysphagia, oropharyngeal phase 05/13/2010  Past Surgical History:  Procedure Laterality Date  . AV FISTULA PLACEMENT Right 01/27/2017   Procedure: ARTERIOVENOUS (AV) FISTULA CREATION-RIGHT ARM;  Surgeon: Elam Dutch, MD;  Location: [facility] OR;  Service: Vascular;  Laterality: Right;  . AV FISTULA PLACEMENT Left 08/03/2018   Procedure: LEFT ARTERIOVENOUS (AV) FISTULA CREATION;  Surgeon: Elam Dutch, MD;  Location: [place];  Service: Vascular;  Laterality: Left;  . BIOPSY N/A 05/17/2013   Procedure: BIOPSY;  Surgeon: [name], MD;  Location: [facility]  ORS;  Service: Endoscopy;  Laterality: N/A;  . CHOLECYSTECTOMY N/A 04/27/2014   Procedure: LAPAROSCOPIC CHOLECYSTECTOMY;  Surgeon: [name], MD;  Location: [facility] ORS;  Service: General;  Laterality: N/A;  . COLONOSCOPY  06/06/2010   WUX:LKGMWN bleeding secondary to internal hemorrhoids but incomplete evaluation secondary to poor right colon prep/small rectal and sigmoid colon polyps (hyperplastic). PROPOFOL  . COLONOSCOPY  May 2013   Dr. Gilliam/[facility]: 5 mm a descending colon polyp, hyperplastic. Adequate bowel prep.  . ENDOMETRIAL ABLATION    . 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 ENUCLEATION Left 02/04/2018   Procedure: ENUCLEATION WITH PLACEMENT OF IMPLANT LEFT EYE;  Surgeon: Clista Bernhardt, MD;  Location: [place];  Service: Ophthalmology;  Laterality: Left;  . ESOPHAGOGASTRODUODENOSCOPY  06/06/2010   UUV:[id number] erythema and edema of body of stomach, with sessile polypoid lesions. bx benign. no h.pylori  . ESOPHAGOGASTRODUODENOSCOPY (EGD) WITH PROPOFOL N/A 05/17/2013   Dr. Oneida Alar: normal esophagus, moderate nodular gastritis, negative path, empiric Savary dilation  . EYE SURGERY  07/2017   sx for glaucoma  . FLEXIBLE SIGMOIDOSCOPY N/A 05/17/2013   Dr. Oneida Alar: moderate sized internal hemorrhoids  . HEMORRHOID BANDING N/A 05/17/2013   Procedure: HEMORRHOID BANDING;  Surgeon: [name], MD;  Location: [facility];  Service: Endoscopy;  Laterality: N/A;  2 bands placed  . INCISION AND DRAINAGE ABSCESS Left 10/11/2013   Procedure: INCISION AND DRAINAGE AND DEBRIDEMENT LEFT BREAST  ABSCESS;  Surgeon: [name], MD;  Location: [facility] ORS;  Service: General;  Laterality: Left;  . INSERTION OF DIALYSIS CATHETER N/A 06/08/2018   Procedure: INSERTION OF Right internal jugular TUNNELED  DIALYSIS CATHETER;  Surgeon: Angelia Mould, MD;  Location: [place];  Service: Vascular;  Laterality: N/A;  . IRRIGATION AND DEBRIDEMENT ABSCESS Right  06/01/2013   Procedure: INCISION AND DRAINAGE AND DEBRIDEMENT ABSCESS RIGHT BREAST;  Surgeon: [name], MD;  Location: [facility];  Service: General;  Laterality: Right;  . LEFT EYE REMOVED Left 01/2018   [facility] [address].  Marland Kitchen LIGATION OF ARTERIOVENOUS  FISTULA Right 06/08/2018   Procedure: LIGATION OF ARTERIOVENOUS  FISTULA RIGHT ARM;  Surgeon: Angelia Mould, MD;  Location: [place];  Service: Vascular;  Laterality: Right;  . SAVORY DILATION N/A 05/17/2013   Procedure: SAVORY DILATION;  Surgeon: [name], MD;  Location: [facility];  Service: Endoscopy;  Laterality: N/A;  14/15/16  . SKIN FULL THICKNESS GRAFT Left 05/05/2018   Procedure: ABDOMINAL DERMIS FAT SKIN GRAFT FULL THICKNESS LEFT EYE;  Surgeon: Clista Bernhardt, MD;  Location: [place];  Service: Ophthalmology;  Laterality: Left;  . TUBAL LIGATION       OB History    Gravida  2   Para  2   Term  1   Preterm  1   AB      Living  2     SAB      TAB  Ectopic      Multiple      Live Births               Home Medications    Prior to Admission medications   Medication Sig Start Date End Date Taking? Authorizing Provider  albuterol (PROVENTIL) (2.5 MG/3ML) 0.083% nebulizer solution Take 2.5 mg by nebulization 4 (four) times daily.    [provider]  ALPRAZolam Duanne Moron) 1 MG tablet Take 1 mg by mouth 4 (four) times daily.     [provider]  atorvastatin (LIPITOR) 20 MG tablet Take 20 mg by mouth daily.     [provider]  busPIRone (BUSPAR) 7.5 MG tablet Take 7.5 mg by mouth 2 (two) times daily.    [provider]  calcium carbonate (TUMS EX) 750 MG chewable tablet Chew 1-3 tablets by mouth See admin instructions. Patient takes 3 tablets with meals and 1 tablet with snacks three times a day    [provider]  cefUROXime (CEFTIN) 250 MG tablet Take 2 tablets after each dialysis x5 occasions 06/23/18   Sinda Du, MD    colchicine 0.6 MG tablet Take 1 tablet (0.6 mg total) by mouth every other day. Patient taking differently: Take 0.6 mg by mouth every other day. Take 1 tablet every other day 11/19/16   Sinda Du, MD  dicyclomine (BENTYL) 10 MG capsule Take 1 capsule (10 mg total) by mouth 4 (four) times daily -  before meals and at bedtime. Patient taking differently: Take 10 mg by mouth 4 (four) times daily.  10/08/17   Annitta Needs, NP  gabapentin (NEURONTIN) 300 MG capsule Take 300 mg by mouth at bedtime.     [provider]  HYDROcodone-acetaminophen (NORCO) 10-325 MG tablet Take 1 tablet by mouth every 6 (six) hours as needed for moderate pain. 08/03/18   Ulyses Amor, PA-C  Insulin Glargine (LANTUS SOLOSTAR) 100 UNIT/ML Solostar Pen Inject 30 Units into the skin every morning. And pen needles 1/day 06/29/18   Renato Shin, MD  lidocaine-prilocaine (EMLA) cream Apply 1 application topically as needed (topical anesthesia for hemodialysis if Gebauers and Lidocaine injection are ineffective.). 04/25/17   Johnson, Clanford L, MD  losartan (COZAAR) 25 MG tablet Take 25 mg by mouth daily.    [provider]  PARoxetine (PAXIL) 20 MG tablet Take 1 daily. This is to prevent panic attacks Patient taking differently: Take 20-40 mg by mouth daily. This is to prevent panic attacks 10/28/16   Sinda Du, MD  torsemide (DEMADEX) 100 MG tablet Take 100 mg by mouth See admin instructions. TAKE ONE TABLET BY MOUTH ON NON DIALYSIS DAYS ONLY-    [provider]    Family History Family History  Adopted: Yes  Family history unknown: Yes    Social History Social History   Tobacco Use  . Smoking status: Former Smoker    Packs/day: 1.50    Years: 25.00    Pack years: 37.50    Types: Cigarettes    Last attempt to quit: 10/23/2007    Years since quitting: 10.9  . Smokeless tobacco: Never Used  Substance Use Topics  . Alcohol use: No  . Drug use: No     Allergies   Codeine and  Tape   Review of Systems Review of Systems  Unable to perform ROS: Mental status change     Physical Exam Updated Vital Signs BP (!) 171/77   Pulse 79   Temp 97.9 F (36.6  C) (Oral)   Resp 13   Ht 5\' 8"  (1.727 m)   Wt (!) 158.8 kg   LMP 08/03/2008 (Exact Date)   SpO2 99%   BMI 53.22 kg/m   Physical Exam Constitutional:      Comments: Patient is morbidly obese  HENT:     Mouth/Throat:     Mouth: Mucous membranes are moist.  Eyes:     Comments: Chronic changes left orbit.  Neck:     Musculoskeletal: Neck supple.  Pulmonary:     Effort: Pulmonary effort is normal.     Comments: Harsh breath sounds bilateral bases.  Dialysis catheter right chest wall. Abdominal:     Tenderness: There is no abdominal tenderness.  Musculoskeletal:     Right lower leg: Edema present.     Left lower leg: Edema present.     Comments: Dialysis graft left upper arm.  Neurological:     Mental Status: She is alert.     Comments: Mild confusion and slow to answer but is able to give me the date and recognize her fianc.  Eye movements intact.  Good speech.      ED Treatments / Results  Labs (all labs ordered are listed, but only abnormal results are displayed) Labs Reviewed  COMPREHENSIVE METABOLIC PANEL - Abnormal; Notable for the following components:      Result Value   CO2 20 (*)    Glucose, Bld 363 (*)    BUN 57 (*)    Creatinine, Ser 9.65 (*)    Calcium 8.6 (*)    Albumin 3.4 (*)    GFR calc non Af Amer 4 (*)    GFR calc Af Amer 5 (*)    All other components within normal limits  CBC WITH DIFFERENTIAL/PLATELET - Abnormal; Notable for the following components:   RBC 3.21 (*)    Hemoglobin 9.0 (*)    HCT 29.7 (*)    RDW 15.8 (*)    Platelets 148 (*)    All other components within normal limits  ETHANOL  URINALYSIS, ROUTINE W REFLEX MICROSCOPIC    EKG EKG Interpretation  Date/Time:  Sunday September 26 2018 13:45:32 EST Ventricular Rate:  82 PR Interval:    QRS  Duration: 82 QT Interval:  379 QTC Calculation: 443 R Axis:   29 Text Interpretation:  Sinus rhythm Paired ventricular premature complexes Nonspecific T abnormalities, lateral leads Confirmed by Davonna Belling [id number]) on 09/26/2018 2:25:47 PM   Radiology No results found.  Procedures Procedures (including critical care time)  Medications Ordered in ED Medications - No data to display   Initial Impression / Assessment and Plan / ED Course  I have reviewed the triage vital signs and the nursing notes.  Pertinent labs & imaging results that were available during my care of the patient were reviewed by me and considered in my medical decision making (see chart for details).        Patient with mental status changes.  Last normal last night.  Is short of breath.  Dialysis patient but has missed the last 2 sessions.  Encephalopathic could be uremia.  Chest x-ray and head CT along with urine still pending.  Likely require admission the hospital.  Does not appear to be an acute stroke.  Care will be turned over to Dr. Tomi Bamberger.  Final Clinical Impressions(s) / ED Diagnoses   Final diagnoses:  Encephalopathy  End stage renal disease on dialysis [facility])    ED Discharge Orders  None       Davonna Belling, MD 09/26/18 1457    Davonna Belling, MD 09/26/18 [phone]

## 2018-09-26 NOTE — ED Notes (Signed)
Pt was informed that we need a urine sample. Pt states that she just urinated.

## 2018-09-26 NOTE — ED Triage Notes (Signed)
Pt's fiance states pt started having slurred speech and intemittent confusion at 2100 last night.  Pt woke up at 12:15 today, symptoms were worse. CBG 312

## 2018-09-26 NOTE — H&P (Signed)
History and Physical    Martha [name]:[id number] DOB: 07-25-1966 DOA: 09/26/2018  PCP: Sinda Du, MD   Patient coming from: Home  I have personally briefly reviewed patient's old medical records in [place]  Chief Complaint: Confusion, slurred speech  HPI: Martha [name] is a 53 y.o. female with medical history significant for ESRD, HTN, OSA, restless leg syndrome, depression, bipolar disorder, COPD, who presented to the ED reports of slurred speech and intermittent confusion since last night at about 9 PM.  Martha [name] is present at bedside and helps with the history.  Patient is aware her speech was slurred. She denies any weakness of her extremity.  Fianc present at bedside said patient's response to answers with delayed, and she would keep her repeating same questions and answers.  Patient has missed 2 hemodialysis sessions.  Her schedule is Monday Wednesday Friday.  Patient missed last Wednesday because the day prior she had to urinate several times, apparently did not like this when this happens during dialysis, so she did not go.  On Friday she had back pain for which she was given steroids, so she did not go.  Patient reports some cough, and difficulty breathing, no fevers or chills.  Chest pain.  No leg swelling.  She still makes urine.  ED Course: Systolic 818H to 631S.  On room air.  Blood alcohol less than 10.  BUN elevated 57.  Potassium within normal limits 4.6.  EKG nonspecific T wave changes in lead I and II.  Portable chest x-ray pulmonary vascular congestion, upright AP lateral suggested, subsequently done showed pulmonary vascular congestion. EDP talked to nephrologist on-call who recommended admission here for HD in a.m.  No need for urgent HD tonight.  Review of Systems: As per HPI all other systems reviewed and negative.  Past Medical History:  Diagnosis Date  . Acid reflux    takes Tums  . Anemia   . Arthritis   . Bipolar 1 disorder ([place])   . Blindness of  left eye   . Cervical radiculopathy   . CHF (congestive heart failure) ([place])   . Chronic kidney disease    Stage 5- 01/25/17  . Constipation   . COPD (chronic obstructive pulmonary disease) ([place]) 2014   bronchitis  . Degenerative disc disease, thoracic   . Depression   . Diabetes mellitus    Type II  . Diabetic retinopathy ([place])   . Dyspnea    when walking  . End stage kidney disease ([place])    M, W, F [place]   . Hypertension   . Noncompliance with medication regimen   . Noncompliance with medication regimen   . Obesity (BMI 30-39.9)   . OSA (obstructive sleep apnea)    cpap  . Panic attack   . RLS (restless legs syndrome)     Past Surgical History:  Procedure Laterality Date  . AV FISTULA PLACEMENT Right 01/27/2017   Procedure: ARTERIOVENOUS (AV) FISTULA CREATION-RIGHT ARM;  Surgeon: Elam Dutch, MD;  Location: [facility];  Service: Vascular;  Laterality: Right;  . AV FISTULA PLACEMENT Left 08/03/2018   Procedure: LEFT ARTERIOVENOUS (AV) FISTULA CREATION;  Surgeon: Elam Dutch, MD;  Location: [place];  Service: Vascular;  Laterality: Left;  . BIOPSY N/A 05/17/2013   Procedure: BIOPSY;  Surgeon: [name], MD;  Location[facility];  Service: Endoscopy;  Laterality: N/A;  . CHOLECYSTECTOMY N/A 04/27/2014   Procedure: LAPAROSCOPIC CHOLECYSTECTOMY;  Surgeon: [name], MD;  Location:  [facility];  Service: General;  Laterality: N/A;  . COLONOSCOPY  06/06/2010   YIR:SWNIOE bleeding secondary to internal hemorrhoids but incomplete evaluation secondary to poor right colon prep/small rectal and sigmoid colon polyps (hyperplastic). PROPOFOL  . COLONOSCOPY  May 2013   Dr. Gilliam/[facility]: 5 mm a descending colon polyp, hyperplastic. Adequate bowel prep.  . ENDOMETRIAL ABLATION    . 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 ENUCLEATION Left 02/04/2018   Procedure: ENUCLEATION WITH PLACEMENT OF IMPLANT LEFT EYE;  Surgeon: Clista Bernhardt, MD;  Location: [facility] [facility];  Service: Ophthalmology;  Laterality: Left;  . ESOPHAGOGASTRODUODENOSCOPY  06/06/2010   VOJ:[id number] erythema and edema of body of stomach, with sessile polypoid lesions. bx benign. no h.pylori  . ESOPHAGOGASTRODUODENOSCOPY (EGD) WITH PROPOFOL N/A 05/17/2013   Dr. Oneida Alar: normal esophagus, moderate nodular gastritis, negative path, empiric Savary dilation  . EYE SURGERY  07/2017   sx for glaucoma  . FLEXIBLE SIGMOIDOSCOPY N/A 05/17/2013   Dr. Oneida Alar: moderate sized internal hemorrhoids  . HEMORRHOID BANDING N/A 05/17/2013   Procedure: HEMORRHOID BANDING;  Surgeon: [name], MD;  [facility];  Service: Endoscopy;  Laterality: N/A;  2 bands placed  . INCISION AND DRAINAGE ABSCESS Left 10/11/2013   Procedure: INCISION AND DRAINAGE AND DEBRIDEMENT LEFT BREAST  ABSCESS;  Surgeon: [name], MD;  [facility];  Service: General;  Laterality: Left;  . INSERTION OF DIALYSIS CATHETER N/A 06/08/2018   Procedure: INSERTION OF Right internal jugular TUNNELED  DIALYSIS CATHETER;  Surgeon: Angelia Mould, MD;  Location: [place];  Service: Vascular;  Laterality: N/A;  . IRRIGATION AND DEBRIDEMENT ABSCESS Right 06/01/2013   Procedure: INCISION AND DRAINAGE AND DEBRIDEMENT ABSCESS RIGHT BREAST;  Surgeon: [name], MD;  [facility];  Service: General;  Laterality: Right;  . LEFT EYE REMOVED Left 01/2018   [facility] [address].  Marland Kitchen LIGATION OF ARTERIOVENOUS  FISTULA Right 06/08/2018   Procedure: LIGATION OF ARTERIOVENOUS  FISTULA RIGHT ARM;  Surgeon: Angelia Mould, MD;  Location: [facility];  Service: Vascular;  Laterality: Right;  . SAVORY DILATION N/A 05/17/2013   Procedure: SAVORY DILATION;  Surgeon: [name], MD;  [facility];  Service: Endoscopy;  Laterality: N/A;  14/15/16  . SKIN FULL THICKNESS GRAFT Left 05/05/2018   Procedure: ABDOMINAL DERMIS FAT SKIN GRAFT FULL THICKNESS LEFT EYE;  Surgeon: Clista Bernhardt, MD;  Location: [place];  Service: Ophthalmology;  Laterality: Left;  . TUBAL LIGATION       reports that she quit smoking about 10 years ago. Her smoking use included cigarettes. She has a 37.50 pack-year smoking history. She has never used smokeless tobacco. She reports that she does not drink alcohol or use drugs.  Allergies  Allergen Reactions  . Codeine Nausea And Vomiting  . Tape Rash    Pull skin off.  Paper tape is ok    Family History  Adopted: Yes  Family history unknown: Yes    Prior to Admission medications   Medication Sig Start Date End Date Taking? Authorizing Provider  albuterol (PROVENTIL) (2.5 MG/3ML) 0.083% nebulizer solution Take 2.5 mg by nebulization 4 (four) times daily.    [provider]  ALPRAZolam Duanne Moron) 1 MG tablet Take 1 mg by mouth 4 (four) times daily.     [provider]  atorvastatin (LIPITOR) 20 MG tablet Take 20 mg by mouth daily.     [provider]  busPIRone (BUSPAR) 7.5 MG tablet Take 7.5 mg by mouth 2 (two) times daily.    [provider]  calcium carbonate (TUMS EX) 750 MG chewable tablet Chew 1-3 tablets by mouth See admin instructions. Patient takes 3 tablets with meals and 1 tablet with snacks three times a day    [provider]  cefUROXime (CEFTIN) 250 MG tablet Take 2 tablets after each dialysis x5 occasions 06/23/18   Sinda Du, MD  colchicine 0.6 MG tablet Take 1 tablet (0.6 mg total) by mouth every other day. Patient taking differently: Take 0.6 mg by mouth every other day. Take 1 tablet every other day 11/19/16   Sinda Du, MD  dicyclomine (BENTYL) 10 MG capsule Take 1 capsule (10 mg total) by mouth 4 (four) times daily -  before meals and at bedtime. Patient taking differently: Take 10 mg by mouth 4 (four) times daily.  10/08/17   Annitta Needs, NP  gabapentin (NEURONTIN) 300 MG capsule Take 300 mg by mouth at bedtime.     [provider]    HYDROcodone-acetaminophen (NORCO) 10-325 MG tablet Take 1 tablet by mouth every 6 (six) hours as needed for moderate pain. 08/03/18   Ulyses Amor, PA-C  Insulin Glargine (LANTUS SOLOSTAR) 100 UNIT/ML Solostar Pen Inject 30 Units into the skin every morning. And pen needles 1/day 06/29/18   Renato Shin, MD  lidocaine-prilocaine (EMLA) cream Apply 1 application topically as needed (topical anesthesia for hemodialysis if Gebauers and Lidocaine injection are ineffective.). 04/25/17   Johnson, Clanford L, MD  losartan (COZAAR) 25 MG tablet Take 25 mg by mouth daily.    [provider]  PARoxetine (PAXIL) 20 MG tablet Take 1 daily. This is to prevent panic attacks Patient taking differently: Take 20-40 mg by mouth daily. This is to prevent panic attacks 10/28/16   Sinda Du, MD  torsemide (DEMADEX) 100 MG tablet Take 100 mg by mouth See admin instructions. TAKE ONE TABLET BY MOUTH ON NON DIALYSIS DAYS ONLY-    [provider]    Physical Exam: Vitals:   09/26/18 1328 09/26/18 1348 09/26/18 1349  BP: (!) 189/82  (!) 171/77  Pulse: 85 79   Resp: (!) 22 13   Temp: 97.9 F (36.6 C)    TempSrc: Oral    SpO2: 96% 99%   Weight: (!) 158.8 kg    Height: 5\' 8"  (1.727 m)      Constitutional: NAD, calm, comfortable Vitals:   09/26/18 1328 09/26/18 1348 09/26/18 1349  BP: (!) 189/82  (!) 171/77  Pulse: 85 79   Resp: (!) 22 13   Temp: 97.9 F (36.6 C)    TempSrc: Oral    SpO2: 96% 99%   Weight: (!) 158.8 kg    Height: 5\' 8"  (1.727 m)     Eyes: s/p left eye enucleation, lids closed, with drainage, chronic.  ENMT: Mucous membranes are moist. Posterior pharynx clear of any exudate or lesions.  Neck: normal, supple, no masses, no thyromegaly Respiratory: clear to auscultation bilaterally, unable to appreciate wheezing, or crackles. Normal respiratory effort. No accessory muscle use.  Cardiovascular: Regular rate and rhythm, no murmurs / rubs / gallops. No significant  extremity edema. 2+ pedal pulses. Abdomen: obese no tenderness, no masses palpated. No hepatosplenomegaly. Bowel sounds positive.  Musculoskeletal: no clubbing / cyanosis. No joint deformity upper and lower extremities. Good ROM, no contractures. Normal muscle tone.  Skin: no rashes, lesions, ulcers. No induration Neurologic: CN 2-12 grossly  intact. Strength 5/5 in all 4.  Psychiatric: Alert and oriented to person and place.  Response to questions are delayed, requiring repetition of questions, sometimes answers would stop midway.  Labs on Admission: I have personally reviewed following labs and imaging studies  CBC: Recent Labs  Lab 09/26/18 1345  WBC 5.7  NEUTROABS 4.0  HGB 9.0*  HCT 29.7*  MCV 92.5  PLT 149*   Basic Metabolic Panel: Recent Labs  Lab 09/26/18 1345  NA 142  K 4.6  CL 108  CO2 20*  GLUCOSE 363*  BUN 57*  CREATININE 9.65*  CALCIUM 8.6*   Liver Function Tests: Recent Labs  Lab 09/26/18 1345  AST 19  ALT 12  ALKPHOS 87  BILITOT 0.4  PROT 7.1  ALBUMIN 3.4*   Urine analysis:    Component Value Date/Time   COLORURINE YELLOW 09/26/2018 1338   APPEARANCEUR CLOUDY (A) 09/26/2018 1338   LABSPEC 1.014 09/26/2018 1338   PHURINE 6.0 09/26/2018 1338   GLUCOSEU >=500 (A) 09/26/2018 1338   HGBUR SMALL (A) 09/26/2018 1338   BILIRUBINUR NEGATIVE 09/26/2018 1338   KETONESUR NEGATIVE 09/26/2018 1338   PROTEINUR >=300 (A) 09/26/2018 1338   UROBILINOGEN 0.2 03/17/2015 2236   NITRITE NEGATIVE 09/26/2018 1338   LEUKOCYTESUR TRACE (A) 09/26/2018 1338    Radiological Exams on Admission: Ct Head Wo Contrast  Result Date: 09/26/2018 CLINICAL DATA:  53 year old female with altered level of consciousness, confusion and slurred speech for 1 day. EXAM: CT HEAD WITHOUT CONTRAST TECHNIQUE: Contiguous axial images were obtained from the base of the skull through the vertex without intravenous contrast. COMPARISON:  12/13/2017 CT and prior studies FINDINGS: Brain: No  evidence of acute infarction, hemorrhage, hydrocephalus, extra-axial collection or mass lesion/mass effect. Mild chronic small-vessel white matter ischemic changes again noted. Vascular: No hyperdense vessel or unexpected calcification. Skull: Normal. Negative for fracture or focal lesion. Sinuses/Orbits: LEFT globe is now low-density with apparent internal gas. The paranasal sinuses and mastoid air cells are clear. Other: None. IMPRESSION: 1. No evidence of acute intracranial abnormality. 2. Mild chronic small-vessel white matter ischemic changes 3. LEFT globe is now low-density with apparent internal gas - correlate clinically and with any recent surgery. Electronically Signed   By: Margarette Canada M.D.   On: 09/26/2018 15:06   Dg Chest Portable 1 View  Result Date: 09/26/2018 CLINICAL DATA:  Shortness of breath.  Slurred speech and confusion. EXAM: PORTABLE CHEST 1 VIEW COMPARISON:  09/15/2018 FINDINGS: There is a right sided dialysis catheter with tip projecting over the cavoatrial junction. Cardiac enlargement identified. Diffuse pulmonary vascular congestion. There is asymmetric opacification overlying the right midlung and right base. This is favored to represent breast artifact. Pneumonia or asymmetric edema is considered less favored. IMPRESSION: 1. Pulmonary vascular congestion. 2. Asymmetric opacification in the right midlung and right base is favored to represent breast artifact. Advise repeat chest radiograph in the upright PA lateral orientation as patient's clinical condition tolerates to reassess aeration to the right lower lung. Electronically Signed   By: Kerby Moors M.D.   On: 09/26/2018 14:59    EKG: Independently reviewed.  Rhythm.  Artifacts.  Nonspecific T wave changes 1 and V2.  QTc 443.  Assessment/Plan Active Problems:   Metabolic encephalopathy    Metabolic encephalopathy-likely uremia, from 2 missed HD sessions.  Still with intermittent confusion-on benzos and SSRIs. CT no  evidence of acute intracranial abnormality, but shows low density with apparent internal gas clinical correlation and with any recent surgery suggested-patient has  had recent left enucleation within the past year..  Two-view chest x-ray, vascular congestion .  Describes frequency, but UA-dirty sample  EDP nephrology okay with admission here, for HD in a.m -Plans for HD in a.m. -Repeat UA -Hold home benzos, BuSpar and paroxetine  ESRD - K- 4.6, metabolic encephalopathy, mild acidosis bicarb 20. - Usually on MWF schedule, Plans for HD in a.m. - Cont torsemide  HTN- Stable to elevated, likely from missed HD. - Cont Losartan  Insulin-dependent DM- glucose 363.   Bicarb 20 with normal anion gap. - Cont home Lantus  - SSI  Depression with anxiety  - Hold buspar, Paxil, and Xanax  pending improvement in patient's mental status, and HD   DVT prophylaxis: Heparin Code Status: Full Family Communication: Fiance at bedside Disposition Plan: Per nephrology Consults called: Nephrology Admission status: Obs, tele   Bethena Roys MD [facility]  09/26/2018, 6:21 PM

## 2018-09-27 DIAGNOSIS — I5032 Chronic diastolic (congestive) heart failure: Secondary | ICD-10-CM | POA: Diagnosis present

## 2018-09-27 DIAGNOSIS — Z6841 Body Mass Index (BMI) 40.0 and over, adult: Secondary | ICD-10-CM | POA: Diagnosis not present

## 2018-09-27 DIAGNOSIS — J449 Chronic obstructive pulmonary disease, unspecified: Secondary | ICD-10-CM | POA: Diagnosis present

## 2018-09-27 DIAGNOSIS — D631 Anemia in chronic kidney disease: Secondary | ICD-10-CM | POA: Diagnosis present

## 2018-09-27 DIAGNOSIS — E669 Obesity, unspecified: Secondary | ICD-10-CM | POA: Diagnosis present

## 2018-09-27 DIAGNOSIS — G2581 Restless legs syndrome: Secondary | ICD-10-CM | POA: Diagnosis present

## 2018-09-27 DIAGNOSIS — E1165 Type 2 diabetes mellitus with hyperglycemia: Secondary | ICD-10-CM | POA: Diagnosis present

## 2018-09-27 DIAGNOSIS — T426X5A Adverse effect of other antiepileptic and sedative-hypnotic drugs, initial encounter: Secondary | ICD-10-CM | POA: Diagnosis present

## 2018-09-27 DIAGNOSIS — Z9049 Acquired absence of other specified parts of digestive tract: Secondary | ICD-10-CM | POA: Diagnosis not present

## 2018-09-27 DIAGNOSIS — G4733 Obstructive sleep apnea (adult) (pediatric): Secondary | ICD-10-CM | POA: Diagnosis present

## 2018-09-27 DIAGNOSIS — E872 Acidosis: Secondary | ICD-10-CM | POA: Diagnosis present

## 2018-09-27 DIAGNOSIS — K219 Gastro-esophageal reflux disease without esophagitis: Secondary | ICD-10-CM | POA: Diagnosis present

## 2018-09-27 DIAGNOSIS — G92 Toxic encephalopathy: Secondary | ICD-10-CM | POA: Diagnosis not present

## 2018-09-27 DIAGNOSIS — E11319 Type 2 diabetes mellitus with unspecified diabetic retinopathy without macular edema: Secondary | ICD-10-CM | POA: Diagnosis present

## 2018-09-27 DIAGNOSIS — I132 Hypertensive heart and chronic kidney disease with heart failure and with stage 5 chronic kidney disease, or end stage renal disease: Secondary | ICD-10-CM | POA: Diagnosis present

## 2018-09-27 DIAGNOSIS — F319 Bipolar disorder, unspecified: Secondary | ICD-10-CM | POA: Diagnosis present

## 2018-09-27 DIAGNOSIS — N2581 Secondary hyperparathyroidism of renal origin: Secondary | ICD-10-CM | POA: Diagnosis present

## 2018-09-27 DIAGNOSIS — F418 Other specified anxiety disorders: Secondary | ICD-10-CM | POA: Diagnosis present

## 2018-09-27 DIAGNOSIS — N186 End stage renal disease: Secondary | ICD-10-CM | POA: Diagnosis present

## 2018-09-27 DIAGNOSIS — R4182 Altered mental status, unspecified: Secondary | ICD-10-CM | POA: Diagnosis not present

## 2018-09-27 DIAGNOSIS — E1122 Type 2 diabetes mellitus with diabetic chronic kidney disease: Secondary | ICD-10-CM | POA: Diagnosis present

## 2018-09-27 DIAGNOSIS — T43595A Adverse effect of other antipsychotics and neuroleptics, initial encounter: Secondary | ICD-10-CM | POA: Diagnosis present

## 2018-09-27 DIAGNOSIS — G4730 Sleep apnea, unspecified: Secondary | ICD-10-CM | POA: Diagnosis present

## 2018-09-27 DIAGNOSIS — F410 Panic disorder [episodic paroxysmal anxiety] without agoraphobia: Secondary | ICD-10-CM | POA: Diagnosis present

## 2018-09-27 DIAGNOSIS — I10 Essential (primary) hypertension: Secondary | ICD-10-CM | POA: Diagnosis not present

## 2018-09-27 LAB — IRON AND TIBC
Iron: 41 ug/dL (ref 28–170)
Saturation Ratios: 15 % (ref 10.4–31.8)
TIBC: 270 ug/dL (ref 250–450)
UIBC: 229 ug/dL

## 2018-09-27 LAB — GLUCOSE, CAPILLARY
Glucose-Capillary: 312 mg/dL — ABNORMAL HIGH (ref 70–99)
Glucose-Capillary: 231 mg/dL — ABNORMAL HIGH (ref 70–99)
Glucose-Capillary: 258 mg/dL — ABNORMAL HIGH (ref 70–99)
Glucose-Capillary: 295 mg/dL — ABNORMAL HIGH (ref 70–99)
Glucose-Capillary: 121 mg/dL — ABNORMAL HIGH (ref 70–99)
Glucose-Capillary: 172 mg/dL — ABNORMAL HIGH (ref 70–99)

## 2018-09-27 LAB — MRSA PCR SCREENING: MRSA by PCR: NEGATIVE

## 2018-09-27 MED ORDER — DOXERCALCIFEROL 4 MCG/2ML IV SOLN
1.5000 ug | INTRAVENOUS | Status: DC
  Administered 2018-09-27 – 2018-09-29 (×2): 1.5 ug via INTRAVENOUS
  Filled 2018-09-27 (×2): qty 2

## 2018-09-27 MED ORDER — CALCIUM ACETATE (PHOS BINDER) 667 MG PO CAPS
1334.0000 mg | ORAL_CAPSULE | Freq: Three times a day (TID) | ORAL | Status: DC
  Administered 2018-09-27 – 2018-09-29 (×7): 1334 mg via ORAL
  Filled 2018-09-27 (×7): qty 2

## 2018-09-27 MED ORDER — EPOETIN ALFA 10000 UNIT/ML IJ SOLN
8000.0000 [IU] | INTRAMUSCULAR | Status: DC
  Administered 2018-09-27 – 2018-09-29 (×2): 8000 [IU] via INTRAVENOUS
  Filled 2018-09-27 (×2): qty 1

## 2018-09-27 MED ORDER — SODIUM CHLORIDE 0.9 % IV SOLN
INTRAVENOUS | Status: DC | PRN
  Administered 2018-09-27: 250 mL via INTRAVENOUS

## 2018-09-27 MED ORDER — ALBUTEROL SULFATE (2.5 MG/3ML) 0.083% IN NEBU
2.5000 mg | INHALATION_SOLUTION | Freq: Four times a day (QID) | RESPIRATORY_TRACT | Status: DC
  Administered 2018-09-27 – 2018-09-29 (×6): 2.5 mg via RESPIRATORY_TRACT
  Filled 2018-09-27 (×7): qty 3

## 2018-09-27 MED ORDER — HEPARIN SODIUM (PORCINE) 1000 UNIT/ML DIALYSIS
3000.0000 [IU] | INTRAMUSCULAR | Status: DC | PRN
  Administered 2018-09-27: 3000 [IU] via INTRAVENOUS_CENTRAL
  Filled 2018-09-27 (×2): qty 3

## 2018-09-27 MED ORDER — HEPARIN 1000 UNIT/ML FOR PERITONEAL DIALYSIS
3000.0000 [IU] | INTRAMUSCULAR | Status: DC
  Filled 2018-09-27: qty 3

## 2018-09-27 NOTE — Consult Note (Signed)
Referring Provider: No ref. provider found Primary Care Physician:  Martha Du, MD Primary Nephrologist:  Dr.   Luiz [name] for Consultation: Medical management of end-stage renal disease, management of anemia, maintenance of euvolemia and treatment of secondary hyperparathyroidism  HPI: [name] who is a dialysis patient that has missed frequent dialysis sessions.  It appears that her usual schedule is Monday Wednesday Friday.  She apparently had missed her dialysis Wednesday and Friday last week.  She presented to the emergency room at [facility] 09/26/2018 with complaints of confusion and slurred speech.  Emergency room she was found to have systolic blood pressures ranging between 762 and 831 systolic her blood alcohol level was less than 10 she had a BUN of 57 and a potassium of 4.6 with nonspecific T wave changes in leads I and II and a chest x-ray that was consistent with pulmonary vascular congestion.  Her dialysis access she appears to have a hemodialysis catheter in the right IJ.  She also has a left arm fistula which is in the upper arm with good thrill and bruit  Blood pressure 183/93 pulse 83 O2 sats 96% room air temperature 98.2.  Sodium 142 potassium 4.7 chloride 109 CO2 18 glucose 235 BUN 56 creatinine 9.67 calcium 8.8 phosphorus 8.8 albumin 3.2 WBC 6.1 hemoglobin 9.1 platelets 147  Chest x-ray showed cardiomegaly with Pulmonary vascular congestion.  CT scan of head revealed no evidence of acute intracranial abnormality the left globe is low density with apparent internal gas correlating with recent surgery she had mild chronic small vessel white matter ischemic changes.  Past Medical History:  Diagnosis Date  . Acid reflux    takes Tums  . Anemia   . Arthritis   . Bipolar 1 disorder ([place])   . Blindness of left eye   . Cervical radiculopathy   . CHF (congestive heart failure) ([place])   . Chronic kidney disease    Stage 5- 01/25/17  . Constipation   .  COPD (chronic obstructive pulmonary disease) ([place]) 2014   bronchitis  . Degenerative disc disease, thoracic   . Depression   . Diabetes mellitus    Type II  . Diabetic retinopathy ([place])   . Dyspnea    when walking  . End stage kidney disease ([place])    M, W, F [facility]  . Hypertension   . Noncompliance with medication regimen   . Noncompliance with medication regimen   . Obesity (BMI 30-39.9)   . OSA (obstructive sleep apnea)    cpap  . Panic attack   . RLS (restless legs syndrome)     Past Surgical History:  Procedure Laterality Date  . AV FISTULA PLACEMENT Right 01/27/2017   Procedure: ARTERIOVENOUS (AV) FISTULA CREATION-RIGHT ARM;  Surgeon: Elam Dutch, MD;  Location: [facility];  Service: Vascular;  Laterality: Right;  . AV FISTULA PLACEMENT Left 08/03/2018   Procedure: LEFT ARTERIOVENOUS (AV) FISTULA CREATION;  Surgeon: Elam Dutch, MD;  Location: [place];  Service: Vascular;  Laterality: Left;  . BIOPSY N/A 05/17/2013   Procedure: BIOPSY;  Surgeon: [name], MD;  Location: [facility];  Service: Endoscopy;  Laterality: N/A;  . CHOLECYSTECTOMY N/A 04/27/2014   Procedure: LAPAROSCOPIC CHOLECYSTECTOMY;  Surgeon: [name], MD;  Location: [facility];  Service: General;  Laterality: N/A;  . COLONOSCOPY  06/06/2010   DVV:OHYWVP bleeding secondary to internal hemorrhoids but incomplete evaluation secondary to poor right colon prep/small rectal and sigmoid colon polyps (hyperplastic).  PROPOFOL  . COLONOSCOPY  May 2013   Dr. Gilliam/[facility]: 5 mm a descending colon polyp, hyperplastic. Adequate bowel prep.  . ENDOMETRIAL ABLATION    . 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 ENUCLEATION Left 02/04/2018   Procedure: ENUCLEATION WITH PLACEMENT OF IMPLANT LEFT EYE;  Surgeon: Clista Bernhardt, MD;  Location: [place];  Service: Ophthalmology;  Laterality: Left;  . ESOPHAGOGASTRODUODENOSCOPY  06/06/2010   PJK:DTOIZTI erythema and edema  of body of stomach, with sessile polypoid lesions. bx benign. no h.pylori  . ESOPHAGOGASTRODUODENOSCOPY (EGD) WITH PROPOFOL N/A 05/17/2013   Dr. Oneida Alar: normal esophagus, moderate nodular gastritis, negative path, empiric Savary dilation  . EYE SURGERY  07/2017   sx for glaucoma  . FLEXIBLE SIGMOIDOSCOPY N/A 05/17/2013   Dr. Oneida Alar: moderate sized internal hemorrhoids  . HEMORRHOID BANDING N/A 05/17/2013   Procedure: HEMORRHOID BANDING;  Surgeon: [name], MD;  Location[facility];  Service: Endoscopy;  Laterality: N/A;  2 bands placed  . INCISION AND DRAINAGE ABSCESS Left 10/11/2013   Procedure: INCISION AND DRAINAGE AND DEBRIDEMENT LEFT BREAST  ABSCESS;  Surgeon: [name], MD;  Location: [facility];  Service: General;  Laterality: Left;  . INSERTION OF DIALYSIS CATHETER N/A 06/08/2018   Procedure: INSERTION OF Right internal jugular TUNNELED  DIALYSIS CATHETER;  Surgeon: Angelia Mould, MD;  Location: [place];  Service: Vascular;  Laterality: N/A;  . IRRIGATION AND DEBRIDEMENT ABSCESS Right 06/01/2013   Procedure: INCISION AND DRAINAGE AND DEBRIDEMENT ABSCESS RIGHT BREAST;  Surgeon: [name], MD;  [facility];  Service: General;  Laterality: Right;  . LEFT EYE REMOVED Left 01/2018   [facility] [address].  Marland Kitchen LIGATION OF ARTERIOVENOUS  FISTULA Right 06/08/2018   Procedure: LIGATION OF ARTERIOVENOUS  FISTULA RIGHT ARM;  Surgeon: Angelia Mould, MD;  Location: [place];  Service: Vascular;  Laterality: Right;  . SAVORY DILATION N/A 05/17/2013   Procedure: SAVORY DILATION;  Surgeon: [name], MD;  Location[facility];  Service: Endoscopy;  Laterality: N/A;  14/15/16  . SKIN FULL THICKNESS GRAFT Left 05/05/2018   Procedure: ABDOMINAL DERMIS FAT SKIN GRAFT FULL THICKNESS LEFT EYE;  Surgeon: Clista Bernhardt, MD;  Location: [place];  Service: Ophthalmology;  Laterality: Left;  . TUBAL LIGATION      Prior to Admission medications   Medication  Sig Start Date End Date Taking? Authorizing Provider  albuterol (PROVENTIL) (2.5 MG/3ML) 0.083% nebulizer solution Take 2.5 mg by nebulization 4 (four) times daily.   Yes [provider]  ALPRAZolam Duanne Moron) 1 MG tablet Take 1 mg by mouth 4 (four) times daily.    Yes [provider]  atorvastatin (LIPITOR) 20 MG tablet Take 20 mg by mouth daily.    Yes [provider]  busPIRone (BUSPAR) 7.5 MG tablet Take 7.5 mg by mouth 2 (two) times daily.   Yes [provider]  calcium carbonate (TUMS EX) 750 MG chewable tablet Chew 1-3 tablets by mouth See admin instructions. Patient takes 3 tablets with meals and 1 tablet with snacks three times a day   Yes [provider]  colchicine 0.6 MG tablet Take 1 tablet (0.6 mg total) by mouth every other day. Patient taking differently: Take 0.6 mg by mouth every other day. Take 1 tablet every other day 11/19/16  Yes Martha Du, MD  dicyclomine (BENTYL) 10 MG capsule Take 1 capsule (10 mg total) by mouth 4 (four) times daily -  before meals and at bedtime. Patient taking differently: Take 10 mg by mouth 4 (four) times daily.  10/08/17  Yes Annitta Needs, NP  gabapentin (NEURONTIN) 300 MG capsule Take 300 mg by mouth at bedtime.    Yes [provider]  HYDROcodone-acetaminophen (NORCO) 10-325 MG tablet Take 1 tablet by mouth every 6 (six) hours as needed for moderate pain. 08/03/18  Yes Laurence Slate M, PA-C  Insulin Glargine (LANTUS SOLOSTAR) 100 UNIT/ML Solostar Pen Inject 30 Units into the skin every morning. And pen needles 1/day 06/29/18  Yes Renato Shin, MD  lidocaine-prilocaine (EMLA) cream Apply 1 application topically as needed (topical anesthesia for hemodialysis if Gebauers and Lidocaine injection are ineffective.). 04/25/17  Yes Johnson, Clanford L, MD  losartan (COZAAR) 25 MG tablet Take 25 mg by mouth daily.   Yes [provider]  PARoxetine (PAXIL) 20 MG tablet Take 1 daily. This is to prevent  panic attacks Patient taking differently: Take 20-40 mg by mouth daily. This is to prevent panic attacks 10/28/16  Yes Martha Du, MD  torsemide (DEMADEX) 100 MG tablet Take 100 mg by mouth daily.    Yes [provider]  UNKNOWN TO PATIENT Take 1 tablet by mouth daily. Steroid course starting on 09/24/2018   Yes [provider]    Current Facility-Administered Medications  Medication Dose Route Frequency Provider Last Rate Last Dose  . 0.9 %  sodium chloride infusion  100 mL Intravenous PRN Edrick Oh, MD      . 0.9 %  sodium chloride infusion  100 mL Intravenous PRN Edrick Oh, MD      . acetaminophen (TYLENOL) tablet 650 mg  650 mg Oral Q6H PRN Emokpae, Ejiroghene E, MD       Or  . acetaminophen (TYLENOL) suppository 650 mg  650 mg Rectal Q6H PRN Emokpae, Ejiroghene E, MD      . albuterol (PROVENTIL) (2.5 MG/3ML) 0.083% nebulizer solution 2.5 mg  2.5 mg Nebulization QID Emokpae, Ejiroghene E, MD   2.5 mg at 09/26/18 2300  . alteplase (CATHFLO ACTIVASE) injection 2 mg  2 mg Intracatheter Once PRN Edrick Oh, MD      . atorvastatin (LIPITOR) tablet 20 mg  20 mg Oral QHS Emokpae, Ejiroghene E, MD      . cefTRIAXone (ROCEPHIN) 1 g in sodium chloride 0.9 % 100 mL IVPB  1 g Intravenous Q24H Emokpae, Ejiroghene E, MD   Stopped at 09/26/18 1913  . Chlorhexidine Gluconate Cloth 2 % PADS 6 each  6 each Topical Q0600 Edrick Oh, MD      . colchicine tablet 0.6 mg  0.6 mg Oral Q48H Emokpae, Ejiroghene E, MD   0.6 mg at 09/27/18 0815  . dicyclomine (BENTYL) capsule 10 mg  10 mg Oral TID AC & HS Emokpae, Ejiroghene E, MD   10 mg at 09/27/18 0816  . heparin injection 1,000 Units  1,000 Units Dialysis PRN Edrick Oh, MD      . heparin injection 5,000 Units  5,000 Units Subcutaneous Q8H Emokpae, Ejiroghene E, MD   5,000 Units at 09/26/18 2259  . insulin aspart (novoLOG) injection 0-9 Units  0-9 Units Subcutaneous TID WC Emokpae, Ejiroghene E, MD   5 Units at 09/27/18 0817  .  insulin glargine (LANTUS) injection 30 Units  30 Units Subcutaneous BH-q7a Emokpae, Ejiroghene E, MD      . lidocaine (PF) (XYLOCAINE) 1 % injection 5 mL  5 mL Intradermal PRN Edrick Oh, MD      .  lidocaine-prilocaine (EMLA) cream 1 application  1 application Topical PRN Edrick Oh, MD      . losartan (COZAAR) tablet 25 mg  25 mg Oral Daily Emokpae, Ejiroghene E, MD   25 mg at 09/27/18 0816  . ondansetron (ZOFRAN) tablet 4 mg  4 mg Oral Q6H PRN Emokpae, Ejiroghene E, MD       Or  . ondansetron (ZOFRAN) injection 4 mg  4 mg Intravenous Q6H PRN Emokpae, Ejiroghene E, MD      . pentafluoroprop-tetrafluoroeth (GEBAUERS) aerosol 1 application  1 application Topical PRN Edrick Oh, MD      . polyethylene glycol (MIRALAX / GLYCOLAX) packet 17 g  17 g Oral Daily PRN Emokpae, Ejiroghene E, MD      . torsemide (DEMADEX) tablet 100 mg  100 mg Oral Daily Emokpae, Ejiroghene E, MD   100 mg at 09/27/18 0814    Allergies as of 09/26/2018 - Review Complete 09/26/2018  Allergen Reaction Noted  . Codeine Nausea And Vomiting   . Tape Rash 04/24/2017    Family History  Adopted: Yes  Family history unknown: Yes    Social History   Socioeconomic History  . Marital status: Divorced    Spouse name: Not on file  . Number of children: 2  . Years of education: Not on file  . Highest education level: Not on file  Occupational History  . Occupation: Disabled  Social Needs  . Financial resource strain: Not on file  . Food insecurity:    Worry: Not on file    Inability: Not on file  . Transportation needs:    Medical: Not on file    Non-medical: Not on file  Tobacco Use  . Smoking status: Former Smoker    Packs/day: 1.50    Years: 25.00    Pack years: 37.50    Types: Cigarettes    Last attempt to quit: 10/23/2007    Years since quitting: 10.9  . Smokeless tobacco: Never Used  Substance and Sexual Activity  . Alcohol use: No  . Drug use: No  . Sexual activity: Yes    Birth  control/protection: Surgical  Lifestyle  . Physical activity:    Days per week: Not on file    Minutes per session: Not on file  . Stress: Not on file  Relationships  . Social connections:    Talks on phone: Not on file    Gets together: Not on file    Attends religious service: Not on file    Active member of club or organization: Not on file    Attends meetings of clubs or organizations: Not on file    Relationship status: Not on file  . Intimate partner violence:    Fear of current or ex partner: Not on file    Emotionally abused: Not on file    Physically abused: Not on file    Forced sexual activity: Not on file  Other Topics Concern  . Not on file  Social History Narrative   Lives with fiance.      Review of Systems: Gen: No fever sweats chills no anorexia nausea or vomiting HEENT: No visual complaints, No history of Retinopathy. Normal external appearance No Epistaxis or Sore throat. No sinusitis.   CV: Denies chest pain, angina, palpitations, syncope, orthopnea, PND, peripheral edema, and claudication. Resp: Dyspnea on exertion no dyspnea to conversation no cough no sputum production GI: Denies vomiting blood, jaundice, and fecal incontinence.   Denies dysphagia or odynophagia. GU :  Denies urinary burning, blood in urine, urinary frequency, urinary hesitancy, nocturnal urination, and urinary incontinence.  No renal calculi. MS: Denies joint pain, limitation of movement, and swelling, stiffness, low back pain, extremity pain.  Complains of diffuse weakness Derm: Denies rash, itching, dry skin, hives, moles, warts, or unhealing ulcers.  Psych: History of depression anxiety patient is alert but not oriented Heme: Denies bruising, bleeding, and enlarged lymph nodes. Neuro: No headache.  No diplopia. No dysarthria.  No dysphasia.  No history of CVA.  No Seizures. No paresthesias.  Diffuse weakness Endocrine insulin-dependent diabetes.  No Thyroid disease.  No Adrenal  disease.  Physical Exam: Vital signs in last 24 hours: Temp:  [97.9 F (36.6 C)-98.2 F (36.8 C)] 98.2 F (36.8 C) (03/02 0544) Pulse Rate:  [72-93] 83 (03/02 0816) Resp:  [12-22] 18 (03/02 0544) BP: (122-189)/(57-95) 183/93 (03/02 0816) SpO2:  [93 %-100 %] 96 % (03/02 0544) Weight:  [158.8 kg-161 kg] 161 kg (03/01 2108)   General:   Ill-appearing lady no obvious distress appears to be disoriented to time place and person Head:  Normocephalic and atraumatic. Eyes:  Sclera clear, no icterus.   Conjunctiva pink. Ears:  Normal auditory acuity. Nose:  No deformity, discharge,  or lesions. Mouth:  No deformity or lesions, dentition normal. Neck:  Supple; no masses or thyromegaly. JVP not elevated IJ catheter left Lungs: Diffuse rhonchi heard throughout lung fields with no rales Heart:  Regular rate and rhythm; no murmurs, clicks, rubs,  or gallops. Abdomen:  Soft, nontender and nondistended. No masses, hepatosplenomegaly or hernias noted. Normal bowel sounds, without guarding, and without rebound.   Msk:  Symmetrical without gross deformities. Normal posture. Pulses:  No carotid, renal, femoral bruits. DP and PT symmetrical and equal Extremities:  Without clubbing or edema.  AV fistula palpable with thrill and bruit left arm    neurologic:  Alert and  oriented x4;  grossly normal neurologically. Skin:  Intact without significant lesions or rashes.   Intake/Output from previous day: 03/01 0701 - 03/02 0700 In: 100 [IV Piggyback:100] Out: 1 [Stool:1] Intake/Output this shift: No intake/output data recorded.  Lab Results: Recent Labs    09/26/18 1345 09/26/18 2251  WBC 5.7 6.1  HGB 9.0* 9.1*  HCT 29.7* 30.1*  PLT 148* 147*   BMET Recent Labs    09/26/18 1345 09/26/18 2251  NA 142 142  K 4.6 4.7  CL 108 109  CO2 20* 18*  GLUCOSE 363* 235*  BUN 57* 56*  CREATININE 9.65* 9.67*  CALCIUM 8.6* 8.8*  PHOS  --  8.8*   LFT Recent Labs    09/26/18 1345 09/26/18 2251   PROT 7.1  --   ALBUMIN 3.4* 3.2*  AST 19  --   ALT 12  --   ALKPHOS 87  --   BILITOT 0.4  --    PT/INR No results for input(s): LABPROT, INR in the last 72 hours. Hepatitis Panel No results for input(s): HEPBSAG, HCVAB, HEPAIGM, HEPBIGM in the last 72 hours.  Studies/Results: Dg Chest 2 View  Result Date: 09/26/2018 CLINICAL DATA:  Altered mental status EXAM: CHEST - 2 VIEW COMPARISON:  Chest radiograph earlier today and prior studies FINDINGS: Cardiomegaly with pulmonary vascular congestion noted. Possible RIGHT lung opacity on earlier study is no longer present. No pleural effusion or pneumothorax. RIGHT IJ central venous catheter with tip overlying the LOWER SVC again noted. IMPRESSION: Cardiomegaly with pulmonary vascular congestion. Electronically Signed   By: Cleatis Polka.D.  On: 09/26/2018 16:30   Ct Head Wo Contrast  Result Date: 09/26/2018 CLINICAL DATA:  53 year old female with altered level of consciousness, confusion and slurred speech for 1 day. EXAM: CT HEAD WITHOUT CONTRAST TECHNIQUE: Contiguous axial images were obtained from the base of the skull through the vertex without intravenous contrast. COMPARISON:  12/13/2017 CT and prior studies FINDINGS: Brain: No evidence of acute infarction, hemorrhage, hydrocephalus, extra-axial collection or mass lesion/mass effect. Mild chronic small-vessel white matter ischemic changes again noted. Vascular: No hyperdense vessel or unexpected calcification. Skull: Normal. Negative for fracture or focal lesion. Sinuses/Orbits: LEFT globe is now low-density with apparent internal gas. The paranasal sinuses and mastoid air cells are clear. Other: None. IMPRESSION: 1. No evidence of acute intracranial abnormality. 2. Mild chronic small-vessel white matter ischemic changes 3. LEFT globe is now low-density with apparent internal gas - correlate clinically and with any recent surgery. Electronically Signed   By: Margarette Canada M.D.   On: 09/26/2018 15:06    Dg Chest Portable 1 View  Result Date: 09/26/2018 CLINICAL DATA:  Shortness of breath.  Slurred speech and confusion. EXAM: PORTABLE CHEST 1 VIEW COMPARISON:  09/15/2018 FINDINGS: There is a right sided dialysis catheter with tip projecting over the cavoatrial junction. Cardiac enlargement identified. Diffuse pulmonary vascular congestion. There is asymmetric opacification overlying the right midlung and right base. This is favored to represent breast artifact. Pneumonia or asymmetric edema is considered less favored. IMPRESSION: 1. Pulmonary vascular congestion. 2. Asymmetric opacification in the right midlung and right base is favored to represent breast artifact. Advise repeat chest radiograph in the upright PA lateral orientation as patient's clinical condition tolerates to reassess aeration to the right lower lung. Electronically Signed   By: Kerby Moors M.D.   On: 09/26/2018 14:59    Assessment/Plan: 1. End-stage renal disease usually Monday Wednesday Friday dialysis patient.  She is followed by Dr. Lowanda Foster.  [facility] dialyzes at [facility] dialysis unit.  She has missed several treatments of her dialysis the last dialysis was about a week ago.  We shall plan dialysis today. 2.  Hypertension/volume.  We will try to challenge volume and dry weight in order to achieve better blood pressure control. 3.  Bones hyperphosphatemia noted with institute binders.  Outpatient binders include Tums 1-3 with meals.  We will start PhosLo 667 mg 3 tablets with meals 4.  Anemia we will check [name] stores and evaluate for darbepoetin 5.  Diabetes mellitus as per primary team 6.  Altered mental status with fairly unremarkable head CT.  Working diagnosis would be a toxic metabolic cause.  Reviewing medications she does take gabapentin 300 mg nightly.  I would probably hold this medication at this time.  We will see if she improves with her dialysis treatment.    LOS: 0 Sherril Croon @TODAY @9 :38 AM

## 2018-09-27 NOTE — Procedures (Signed)
    HEMODIALYSIS TREATMENT NOTE (Mon 2 Mar):  4 hour heparin-free dialysis completed via right IJ tunneled catheter. Exit site unremarkable. Goal met: 3.3 liters removed without interruption in ultrafiltration.  All blood was returned.  Rockwell Alexandria, RN

## 2018-09-27 NOTE — Progress Notes (Signed)
Subjective: She says she feels better.  She is approaching her baseline mental status now.  She has been seen by Dr. Justin Mend from nephrology.  No other new complaints  Objective: Vital signs in last 24 hours: Temp:  [97.9 F (36.6 C)-98.2 F (36.8 C)] 98.2 F (36.8 C) (03/02 0544) Pulse Rate:  [72-93] 83 (03/02 0816) Resp:  [12-22] 18 (03/02 0544) BP: (122-189)/(57-95) 183/93 (03/02 0816) SpO2:  [93 %-100 %] 96 % (03/02 0544) Weight:  [158.8 kg-161 kg] 161 kg (03/01 2108) Weight change:     Intake/Output from previous day: 03/01 0701 - 03/02 0700 In: 100 [IV Piggyback:100] Out: 1 [Stool:1]  PHYSICAL EXAM General appearance: alert, cooperative, morbidly obese and Still mildly confused Resp: rhonchi bilaterally Cardio: regular rate and rhythm, S1, S2 normal, no murmur, click, rub or gallop GI: soft, non-tender; bowel sounds normal; no masses,  no organomegaly Extremities: extremities normal, atraumatic, no cyanosis or edema  Lab Results:  Results for orders placed or performed during the hospital encounter of 09/26/18 (from the past 48 hour(s))  Urinalysis, Routine w reflex microscopic     Status: Abnormal   Collection Time: 09/26/18  1:38 PM  Result Value Ref Range   Color, Urine YELLOW YELLOW   APPearance CLOUDY (A) CLEAR   Specific Gravity, Urine 1.014 1.005 - 1.030   pH 6.0 5.0 - 8.0   Glucose, UA >=500 (A) NEGATIVE mg/dL   Hgb urine dipstick SMALL (A) NEGATIVE   Bilirubin Urine NEGATIVE NEGATIVE   Ketones, ur NEGATIVE NEGATIVE mg/dL   Protein, ur >=300 (A) NEGATIVE mg/dL   Nitrite NEGATIVE NEGATIVE   Leukocytes,Ua TRACE (A) NEGATIVE   RBC / HPF 0-5 0 - 5 RBC/hpf   WBC, UA 6-10 0 - 5 WBC/hpf   Bacteria, UA FEW (A) NONE SEEN   Squamous Epithelial / LPF >50 (H) 0 - 5   Amorphous Crystal PRESENT     Comment: Performed at [facility], [address], [place], [place] [address]  Ethanol     Status: None   Collection Time: 09/26/18  1:45 PM  Result Value Ref Range    Alcohol, Ethyl (B) <10 <10 mg/dL    Comment: (NOTE) Lowest detectable limit for serum alcohol is 10 mg/dL. For medical purposes only. Performed at [facility], [address], [place], [place] [address]   Comprehensive metabolic panel     Status: Abnormal   Collection Time: 09/26/18  1:45 PM  Result Value Ref Range   Sodium 142 135 - 145 mmol/L   Potassium 4.6 3.5 - 5.1 mmol/L   Chloride 108 98 - 111 mmol/L   CO2 20 (L) 22 - 32 mmol/L   Glucose, Bld 363 (H) 70 - 99 mg/dL   BUN 57 (H) 6 - 20 mg/dL   Creatinine, Ser 9.65 (H) 0.44 - 1.00 mg/dL   Calcium 8.6 (L) 8.9 - 10.3 mg/dL   Total Protein 7.1 6.5 - 8.1 g/dL   Albumin 3.4 (L) 3.5 - 5.0 g/dL   AST 19 15 - 41 U/L   ALT 12 0 - 44 U/L   Alkaline Phosphatase 87 38 - 126 U/L   Total Bilirubin 0.4 0.3 - 1.2 mg/dL   GFR calc non Af Amer 4 (L) >60 mL/min   GFR calc Af Amer 5 (L) >60 mL/min   Anion gap 14 5 - 15    Comment: Performed at [facility], [address] [place], [place] [address]  CBC with Differential     Status:  Abnormal   Collection Time: 09/26/18  1:45 PM  Result Value Ref Range   WBC 5.7 4.0 - 10.5 K/uL   RBC 3.21 (L) 3.87 - 5.11 MIL/uL   Hemoglobin 9.0 (L) 12.0 - 15.0 g/dL   HCT 29.7 (L) 36.0 - 46.0 %   MCV 92.5 80.0 - 100.0 fL   MCH 28.0 26.0 - 34.0 pg   MCHC 30.3 30.0 - 36.0 g/dL   RDW 15.8 (H) 11.5 - 15.5 %   Platelets 148 (L) 150 - 400 K/uL   nRBC 0.0 0.0 - 0.2 %   Neutrophils Relative % 70 %   Neutro Abs 4.0 1.7 - 7.7 K/uL   Lymphocytes Relative 23 %   Lymphs Abs 1.3 0.7 - 4.0 K/uL   Monocytes Relative 4 %   Monocytes Absolute 0.2 0.1 - 1.0 K/uL   Eosinophils Relative 2 %   Eosinophils Absolute 0.1 0.0 - 0.5 K/uL   Basophils Relative 1 %   Basophils Absolute 0.0 0.0 - 0.1 K/uL   Immature Granulocytes 0 %   Abs Immature Granulocytes 0.02 0.00 - 0.07 K/uL    Comment: Performed at [facility], [address] [place], [place] [address]  Renal function panel     Status: Abnormal   Collection Time:  09/26/18 10:51 PM  Result Value Ref Range   Sodium 142 135 - 145 mmol/L   Potassium 4.7 3.5 - 5.1 mmol/L   Chloride 109 98 - 111 mmol/L   CO2 18 (L) 22 - 32 mmol/L   Glucose, Bld 235 (H) 70 - 99 mg/dL   BUN 56 (H) 6 - 20 mg/dL   Creatinine, Ser 9.67 (H) 0.44 - 1.00 mg/dL   Calcium 8.8 (L) 8.9 - 10.3 mg/dL   Phosphorus 8.8 (H) 2.5 - 4.6 mg/dL   Albumin 3.2 (L) 3.5 - 5.0 g/dL   GFR calc non Af Amer 4 (L) >60 mL/min   GFR calc Af Amer 5 (L) >60 mL/min   Anion gap 15 5 - 15    Comment: Performed at [facility], [address], [place], [place] [address]  CBC     Status: Abnormal   Collection Time: 09/26/18 10:51 PM  Result Value Ref Range   WBC 6.1 4.0 - 10.5 K/uL   RBC 3.20 (L) 3.87 - 5.11 MIL/uL   Hemoglobin 9.1 (L) 12.0 - 15.0 g/dL   HCT 30.1 (L) 36.0 - 46.0 %   MCV 94.1 80.0 - 100.0 fL   MCH 28.4 26.0 - 34.0 pg   MCHC 30.2 30.0 - 36.0 g/dL   RDW 15.9 (H) 11.5 - 15.5 %   Platelets 147 (L) 150 - 400 K/uL   nRBC 0.0 0.0 - 0.2 %    Comment: Performed at [facility], [address], [place], [place] [address]  MRSA PCR Screening     Status: None   Collection Time: 09/27/18  3:08 AM  Result Value Ref Range   MRSA by PCR NEGATIVE NEGATIVE    Comment:        The GeneXpert MRSA Assay (FDA approved for NASAL specimens only), is one component of a comprehensive MRSA colonization surveillance program. It is not intended to diagnose MRSA infection nor to guide or monitor treatment for MRSA infections. Performed at [facility], [address], [place], [place] [address]   Glucose, capillary     Status: Abnormal   Collection Time: 09/27/18  7:30 AM  Result Value Ref Range   Glucose-Capillary 258 (H) 70 -  99 mg/dL    ABGS No results for input(s): PHART, PO2ART, TCO2, HCO3 in the last 72 hours.  Invalid input(s): PCO2 CULTURES Recent Results (from the past 240 hour(s))  MRSA PCR Screening     Status: None   Collection Time: 09/27/18  3:08 AM  Result Value Ref Range Status    MRSA by PCR NEGATIVE NEGATIVE Final    Comment:        The GeneXpert MRSA Assay (FDA approved for NASAL specimens only), is one component of a comprehensive MRSA colonization surveillance program. It is not intended to diagnose MRSA infection nor to guide or monitor treatment for MRSA infections. Performed at [facility], [address] [place], [place] [address]    Studies/Results: Dg Chest 2 View  Result Date: 09/26/2018 CLINICAL DATA:  Altered mental status EXAM: CHEST - 2 VIEW COMPARISON:  Chest radiograph earlier today and prior studies FINDINGS: Cardiomegaly with pulmonary vascular congestion noted. Possible RIGHT lung opacity on earlier study is no longer present. No pleural effusion or pneumothorax. RIGHT IJ central venous catheter with tip overlying the LOWER SVC again noted. IMPRESSION: Cardiomegaly with pulmonary vascular congestion. Electronically Signed   By: Margarette Canada M.D.   On: 09/26/2018 16:30   Ct Head Wo Contrast  Result Date: 09/26/2018 CLINICAL DATA:  53 year old female with altered level of consciousness, confusion and slurred speech for 1 day. EXAM: CT HEAD WITHOUT CONTRAST TECHNIQUE: Contiguous axial images were obtained from the base of the skull through the vertex without intravenous contrast. COMPARISON:  12/13/2017 CT and prior studies FINDINGS: Brain: No evidence of acute infarction, hemorrhage, hydrocephalus, extra-axial collection or mass lesion/mass effect. Mild chronic small-vessel white matter ischemic changes again noted. Vascular: No hyperdense vessel or unexpected calcification. Skull: Normal. Negative for fracture or focal lesion. Sinuses/Orbits: LEFT globe is now low-density with apparent internal gas. The paranasal sinuses and mastoid air cells are clear. Other: None. IMPRESSION: 1. No evidence of acute intracranial abnormality. 2. Mild chronic small-vessel white matter ischemic changes 3. LEFT globe is now low-density with apparent internal gas -  correlate clinically and with any recent surgery. Electronically Signed   By: Margarette Canada M.D.   On: 09/26/2018 15:06   Dg Chest Portable 1 View  Result Date: 09/26/2018 CLINICAL DATA:  Shortness of breath.  Slurred speech and confusion. EXAM: PORTABLE CHEST 1 VIEW COMPARISON:  09/15/2018 FINDINGS: There is a right sided dialysis catheter with tip projecting over the cavoatrial junction. Cardiac enlargement identified. Diffuse pulmonary vascular congestion. There is asymmetric opacification overlying the right midlung and right base. This is favored to represent breast artifact. Pneumonia or asymmetric edema is considered less favored. IMPRESSION: 1. Pulmonary vascular congestion. 2. Asymmetric opacification in the right midlung and right base is favored to represent breast artifact. Advise repeat chest radiograph in the upright PA lateral orientation as patient's clinical condition tolerates to reassess aeration to the right lower lung. Electronically Signed   By: Kerby Moors M.D.   On: 09/26/2018 14:59    Medications:  Prior to Admission:  Medications Prior to Admission  Medication Sig Dispense Refill Last Dose  . albuterol (PROVENTIL) (2.5 MG/3ML) 0.083% nebulizer solution Take 2.5 mg by nebulization 4 (four) times daily.   09/25/2018 at Unknown time  . ALPRAZolam (XANAX) 1 MG tablet Take 1 mg by mouth 4 (four) times daily.    09/25/2018 at Unknown time  . atorvastatin (LIPITOR) 20 MG tablet Take 20 mg by mouth daily.    09/25/2018 at Unknown time  .  busPIRone (BUSPAR) 7.5 MG tablet Take 7.5 mg by mouth 2 (two) times daily.   09/25/2018 at Unknown time  . calcium carbonate (TUMS EX) 750 MG chewable tablet Chew 1-3 tablets by mouth See admin instructions. Patient takes 3 tablets with meals and 1 tablet with snacks three times a day   09/25/2018 at Unknown time  . colchicine 0.6 MG tablet Take 1 tablet (0.6 mg total) by mouth every other day. (Patient taking differently: Take 0.6 mg by mouth every  other day. Take 1 tablet every other day) 16 tablet 12 09/25/2018 at Unknown time  . dicyclomine (BENTYL) 10 MG capsule Take 1 capsule (10 mg total) by mouth 4 (four) times daily -  before meals and at bedtime. (Patient taking differently: Take 10 mg by mouth 4 (four) times daily. ) 120 capsule 3 09/25/2018 at Unknown time  . gabapentin (NEURONTIN) 300 MG capsule Take 300 mg by mouth at bedtime.    09/25/2018 at Unknown time  . HYDROcodone-acetaminophen (NORCO) 10-325 MG tablet Take 1 tablet by mouth every 6 (six) hours as needed for moderate pain. 6 tablet 0 09/25/2018 at Unknown time  . Insulin Glargine (LANTUS SOLOSTAR) 100 UNIT/ML Solostar Pen Inject 30 Units into the skin every morning. And pen needles 1/day 15 mL 11 09/25/2018 at Unknown time  . lidocaine-prilocaine (EMLA) cream Apply 1 application topically as needed (topical anesthesia for hemodialysis if Gebauers and Lidocaine injection are ineffective.). 30 g 0 unknown  . losartan (COZAAR) 25 MG tablet Take 25 mg by mouth daily.   09/25/2018 at Unknown time  . PARoxetine (PAXIL) 20 MG tablet Take 1 daily. This is to prevent panic attacks (Patient taking differently: Take 20-40 mg by mouth daily. This is to prevent panic attacks) 30 tablet 12 09/25/2018 at Unknown time  . torsemide (DEMADEX) 100 MG tablet Take 100 mg by mouth daily.    09/25/2018 at Unknown time  . UNKNOWN TO PATIENT Take 1 tablet by mouth daily. Steroid course starting on 09/24/2018   09/25/2018 at Unknown time   Scheduled: . albuterol  2.5 mg Nebulization QID  . atorvastatin  20 mg Oral QHS  . Chlorhexidine Gluconate Cloth  6 each Topical Q0600  . colchicine  0.6 mg Oral Q48H  . dicyclomine  10 mg Oral TID AC & HS  . heparin  5,000 Units Subcutaneous Q8H  . insulin aspart  0-9 Units Subcutaneous TID WC  . insulin glargine  30 Units Subcutaneous [id number]  . losartan  25 mg Oral Daily  . torsemide  100 mg Oral Daily   Continuous: . sodium chloride    . sodium chloride    .  cefTRIAXone (ROCEPHIN)  IV Stopped (09/26/18 1913)   OIZ:TIWPYK chloride, sodium chloride, acetaminophen **OR** acetaminophen, alteplase, heparin, lidocaine (PF), lidocaine-prilocaine, ondansetron **OR** ondansetron (ZOFRAN) IV, pentafluoroprop-tetrafluoroeth, polyethylene glycol  Assesment: She is here with metabolic encephalopathy.  I think this is likely related to uremia after 2 missed hemodialysis sessions.  Plan is for hemodialysis this morning.  She has diabetes and is on insulin  She has hypertension and that is been fairly good but had gone up I think also because she missed dialysis  She has anxiety and depression on medications stable Active Problems:   Metabolic encephalopathy    Plan: No change in meds.  Dialysis today and see how she does.    LOS: 0 days   Alonza Bogus 09/27/2018, 9:02 AM

## 2018-09-28 ENCOUNTER — Inpatient Hospital Stay (HOSPITAL_COMMUNITY): Payer: Medicaid Other

## 2018-09-28 LAB — GLUCOSE, CAPILLARY
Glucose-Capillary: 186 mg/dL — ABNORMAL HIGH (ref 70–99)
Glucose-Capillary: 243 mg/dL — ABNORMAL HIGH (ref 70–99)
Glucose-Capillary: 180 mg/dL — ABNORMAL HIGH (ref 70–99)
Glucose-Capillary: 163 mg/dL — ABNORMAL HIGH (ref 70–99)

## 2018-09-28 MED ORDER — CHLORHEXIDINE GLUCONATE CLOTH 2 % EX PADS
6.0000 | MEDICATED_PAD | Freq: Every day | CUTANEOUS | Status: DC
  Administered 2018-09-28: 6 via TOPICAL

## 2018-09-28 MED ORDER — SODIUM CHLORIDE 0.9 % IV SOLN
125.0000 mg | Freq: Every day | INTRAVENOUS | Status: DC
  Administered 2018-09-28 – 2018-09-29 (×2): 125 mg via INTRAVENOUS
  Filled 2018-09-28 (×3): qty 10

## 2018-09-28 NOTE — Progress Notes (Signed)
Subjective: She was admitted with confusion presumably from missing dialysis x2.  She complained that she gets pain in her hands with dialysis and wants me to send her to a specialist but I do not think that we will actually do any good because I think is likely related to actually undergoing dialysis.  This morning she is better as far as her confusion is concerned but she is still not good.  She thinks it is February and is unable to provide her date of birth  Objective: Vital signs in last 24 hours: Temp:  [98 F (36.7 C)-99.1 F (37.3 C)] 98 F (36.7 C) (03/03 0762) Pulse Rate:  [63-80] 72 (03/03 0638) Resp:  [18-20] 18 (03/03 2633) BP: (117-200)/(59-103) 157/81 (03/03 3545) SpO2:  [95 %-100 %] 100 % (03/03 0804) Weight:  [156.9 kg-161.5 kg] 156.9 kg (03/03 0600) Weight change: 2.741 kg    Intake/Output from previous day: 03/02 0701 - 03/03 0700 In: 385.2 [P.O.:240; I.V.:45.2; IV Piggyback:100] Out: 3301 [Emesis/NG output:1]  PHYSICAL EXAM General appearance: alert, cooperative and mild distress Resp: clear to auscultation bilaterally Cardio: regular rate and rhythm, S1, S2 normal, no murmur, click, rub or gallop GI: soft, non-tender; bowel sounds normal; no masses,  no organomegaly Extremities: extremities normal, atraumatic, no cyanosis or edema  Lab Results:  Results for orders placed or performed during the hospital encounter of 09/26/18 (from the past 48 hour(s))  Glucose, capillary     Status: Abnormal   Collection Time: 09/26/18  1:25 PM  Result Value Ref Range   Glucose-Capillary 312 (H) 70 - 99 mg/dL  Urinalysis, Routine w reflex microscopic     Status: Abnormal   Collection Time: 09/26/18  1:38 PM  Result Value Ref Range   Color, Urine YELLOW YELLOW   APPearance CLOUDY (A) CLEAR   Specific Gravity, Urine 1.014 1.005 - 1.030   pH 6.0 5.0 - 8.0   Glucose, UA >=500 (A) NEGATIVE mg/dL   Hgb urine dipstick SMALL (A) NEGATIVE   Bilirubin Urine NEGATIVE NEGATIVE    Ketones, ur NEGATIVE NEGATIVE mg/dL   Protein, ur >=300 (A) NEGATIVE mg/dL   Nitrite NEGATIVE NEGATIVE   Leukocytes,Ua TRACE (A) NEGATIVE   RBC / HPF 0-5 0 - 5 RBC/hpf   WBC, UA 6-10 0 - 5 WBC/hpf   Bacteria, UA FEW (A) NONE SEEN   Squamous Epithelial / LPF >50 (H) 0 - 5   Amorphous Crystal PRESENT     Comment: Performed at [facility], [address] [place], [place] [address]  Ethanol     Status: None   Collection Time: 09/26/18  1:45 PM  Result Value Ref Range   Alcohol, Ethyl (B) <10 <10 mg/dL    Comment: (NOTE) Lowest detectable limit for serum alcohol is 10 mg/dL. For medical purposes only. Performed at [facility], [address], [place],  [address]   Comprehensive metabolic panel     Status: Abnormal   Collection Time: 09/26/18  1:45 PM  Result Value Ref Range   Sodium 142 135 - 145 mmol/L   Potassium 4.6 3.5 - 5.1 mmol/L   Chloride 108 98 - 111 mmol/L   CO2 20 (L) 22 - 32 mmol/L   Glucose, Bld 363 (H) 70 - 99 mg/dL   BUN 57 (H) 6 - 20 mg/dL   Creatinine, Ser 9.65 (H) 0.44 - 1.00 mg/dL   Calcium 8.6 (L) 8.9 - 10.3 mg/dL   Total Protein 7.1 6.5 - 8.1 g/dL   Albumin 3.4 (  L) 3.5 - 5.0 g/dL   AST 19 15 - 41 U/L   ALT 12 0 - 44 U/L   Alkaline Phosphatase 87 38 - 126 U/L   Total Bilirubin 0.4 0.3 - 1.2 mg/dL   GFR calc non Af Amer 4 (L) >60 mL/min   GFR calc Af Amer 5 (L) >60 mL/min   Anion gap 14 5 - 15    Comment: Performed at [facility], [address], [place], [place] [address]  CBC with Differential     Status: Abnormal   Collection Time: 09/26/18  1:45 PM  Result Value Ref Range   WBC 5.7 4.0 - 10.5 K/uL   RBC 3.21 (L) 3.87 - 5.11 MIL/uL   Hemoglobin 9.0 (L) 12.0 - 15.0 g/dL   HCT 29.7 (L) 36.0 - 46.0 %   MCV 92.5 80.0 - 100.0 fL   MCH 28.0 26.0 - 34.0 pg   MCHC 30.3 30.0 - 36.0 g/dL   RDW 15.8 (H) 11.5 - 15.5 %   Platelets 148 (L) 150 - 400 K/uL   nRBC 0.0 0.0 - 0.2 %   Neutrophils Relative % 70 %   Neutro Abs 4.0 1.7 - 7.7 K/uL   Lymphocytes  Relative 23 %   Lymphs Abs 1.3 0.7 - 4.0 K/uL   Monocytes Relative 4 %   Monocytes Absolute 0.2 0.1 - 1.0 K/uL   Eosinophils Relative 2 %   Eosinophils Absolute 0.1 0.0 - 0.5 K/uL   Basophils Relative 1 %   Basophils Absolute 0.0 0.0 - 0.1 K/uL   Immature Granulocytes 0 %   Abs Immature Granulocytes 0.02 0.00 - 0.07 K/uL    Comment: Performed at [facility], [address], [place], [place] [address]  Iron and TIBC     Status: None   Collection Time: 09/26/18  1:45 PM  Result Value Ref Range   Iron 41 28 - 170 ug/dL   TIBC 270 250 - 450 ug/dL   Saturation Ratios 15 10.4 - 31.8 %   UIBC 229 ug/dL    Comment: Performed at [facility], [address], [place], [place] [address]  Glucose, capillary     Status: Abnormal   Collection Time: 09/26/18  9:36 PM  Result Value Ref Range   Glucose-Capillary 231 (H) 70 - 99 mg/dL  Renal function panel     Status: Abnormal   Collection Time: 09/26/18 10:51 PM  Result Value Ref Range   Sodium 142 135 - 145 mmol/L   Potassium 4.7 3.5 - 5.1 mmol/L   Chloride 109 98 - 111 mmol/L   CO2 18 (L) 22 - 32 mmol/L   Glucose, Bld 235 (H) 70 - 99 mg/dL   BUN 56 (H) 6 - 20 mg/dL   Creatinine, Ser 9.67 (H) 0.44 - 1.00 mg/dL   Calcium 8.8 (L) 8.9 - 10.3 mg/dL   Phosphorus 8.8 (H) 2.5 - 4.6 mg/dL   Albumin 3.2 (L) 3.5 - 5.0 g/dL   GFR calc non Af Amer 4 (L) >60 mL/min   GFR calc Af Amer 5 (L) >60 mL/min   Anion gap 15 5 - 15    Comment: Performed at [facility], [address] [place], [place] [address]  CBC     Status: Abnormal   Collection Time: 09/26/18 10:51 PM  Result Value Ref Range   WBC 6.1 4.0 - 10.5 K/uL   RBC 3.20 (L) 3.87 - 5.11 MIL/uL   Hemoglobin 9.1 (L) 12.0 - 15.0 g/dL  HCT 30.1 (L) 36.0 - 46.0 %   MCV 94.1 80.0 - 100.0 fL   MCH 28.4 26.0 - 34.0 pg   MCHC 30.2 30.0 - 36.0 g/dL   RDW 15.9 (H) 11.5 - 15.5 %   Platelets 147 (L) 150 - 400 K/uL   nRBC 0.0 0.0 - 0.2 %    Comment: Performed at [facility], [address]  [place], [place] [address]  MRSA PCR Screening     Status: None   Collection Time: 09/27/18  3:08 AM  Result Value Ref Range   MRSA by PCR NEGATIVE NEGATIVE    Comment:        The GeneXpert MRSA Assay (FDA approved for NASAL specimens only), is one component of a comprehensive MRSA colonization surveillance program. It is not intended to diagnose MRSA infection nor to guide or monitor treatment for MRSA infections. Performed at [facility], [address] [place], [place] [address]   Glucose, capillary     Status: Abnormal   Collection Time: 09/27/18  7:30 AM  Result Value Ref Range   Glucose-Capillary 258 (H) 70 - 99 mg/dL  Glucose, capillary     Status: Abnormal   Collection Time: 09/27/18 11:39 AM  Result Value Ref Range   Glucose-Capillary 295 (H) 70 - 99 mg/dL  Glucose, capillary     Status: Abnormal   Collection Time: 09/27/18  3:58 PM  Result Value Ref Range   Glucose-Capillary 121 (H) 70 - 99 mg/dL   Comment 1 Notify RN    Comment 2 Document in Chart   Glucose, capillary     Status: Abnormal   Collection Time: 09/27/18  9:39 PM  Result Value Ref Range   Glucose-Capillary 172 (H) 70 - 99 mg/dL   Comment 1 Notify RN    Comment 2 Document in Chart   Glucose, capillary     Status: Abnormal   Collection Time: 09/28/18  7:31 AM  Result Value Ref Range   Glucose-Capillary 186 (H) 70 - 99 mg/dL   Comment 1 Notify RN    Comment 2 Document in Chart     ABGS No results for input(s): PHART, PO2ART, TCO2, HCO3 in the last 72 hours.  Invalid input(s): PCO2 CULTURES Recent Results (from the past 240 hour(s))  MRSA PCR Screening     Status: None   Collection Time: 09/27/18  3:08 AM  Result Value Ref Range Status   MRSA by PCR NEGATIVE NEGATIVE Final    Comment:        The GeneXpert MRSA Assay (FDA approved for NASAL specimens only), is one component of a comprehensive MRSA colonization surveillance program. It is not intended to diagnose MRSA infection nor to guide  or monitor treatment for MRSA infections. Performed at [facility], [address] [place], [place] [address]    Studies/Results: Dg Chest 2 View  Result Date: 09/26/2018 CLINICAL DATA:  Altered mental status EXAM: CHEST - 2 VIEW COMPARISON:  Chest radiograph earlier today and prior studies FINDINGS: Cardiomegaly with pulmonary vascular congestion noted. Possible RIGHT lung opacity on earlier study is no longer present. No pleural effusion or pneumothorax. RIGHT IJ central venous catheter with tip overlying the LOWER SVC again noted. IMPRESSION: Cardiomegaly with pulmonary vascular congestion. Electronically Signed   By: Margarette Canada M.D.   On: 09/26/2018 16:30   Ct Head Wo Contrast  Result Date: 09/26/2018 CLINICAL DATA:  53 year old female with altered level of consciousness, confusion and slurred speech for 1 day. EXAM: CT  HEAD WITHOUT CONTRAST TECHNIQUE: Contiguous axial images were obtained from the base of the skull through the vertex without intravenous contrast. COMPARISON:  12/13/2017 CT and prior studies FINDINGS: Brain: No evidence of acute infarction, hemorrhage, hydrocephalus, extra-axial collection or mass lesion/mass effect. Mild chronic small-vessel white matter ischemic changes again noted. Vascular: No hyperdense vessel or unexpected calcification. Skull: Normal. Negative for fracture or focal lesion. Sinuses/Orbits: LEFT globe is now low-density with apparent internal gas. The paranasal sinuses and mastoid air cells are clear. Other: None. IMPRESSION: 1. No evidence of acute intracranial abnormality. 2. Mild chronic small-vessel white matter ischemic changes 3. LEFT globe is now low-density with apparent internal gas - correlate clinically and with any recent surgery. Electronically Signed   By: Margarette Canada M.D.   On: 09/26/2018 15:06   Dg Chest Portable 1 View  Result Date: 09/26/2018 CLINICAL DATA:  Shortness of breath.  Slurred speech and confusion. EXAM: PORTABLE CHEST 1 VIEW  COMPARISON:  09/15/2018 FINDINGS: There is a right sided dialysis catheter with tip projecting over the cavoatrial junction. Cardiac enlargement identified. Diffuse pulmonary vascular congestion. There is asymmetric opacification overlying the right midlung and right base. This is favored to represent breast artifact. Pneumonia or asymmetric edema is considered less favored. IMPRESSION: 1. Pulmonary vascular congestion. 2. Asymmetric opacification in the right midlung and right base is favored to represent breast artifact. Advise repeat chest radiograph in the upright PA lateral orientation as patient's clinical condition tolerates to reassess aeration to the right lower lung. Electronically Signed   By: Kerby Moors M.D.   On: 09/26/2018 14:59    Medications:  Prior to Admission:  Medications Prior to Admission  Medication Sig Dispense Refill Last Dose  . albuterol (PROVENTIL) (2.5 MG/3ML) 0.083% nebulizer solution Take 2.5 mg by nebulization 4 (four) times daily.   09/25/2018 at Unknown time  . ALPRAZolam (XANAX) 1 MG tablet Take 1 mg by mouth 4 (four) times daily.    09/25/2018 at Unknown time  . atorvastatin (LIPITOR) 20 MG tablet Take 20 mg by mouth daily.    09/25/2018 at Unknown time  . busPIRone (BUSPAR) 7.5 MG tablet Take 7.5 mg by mouth 2 (two) times daily.   09/25/2018 at Unknown time  . calcium carbonate (TUMS EX) 750 MG chewable tablet Chew 1-3 tablets by mouth See admin instructions. Patient takes 3 tablets with meals and 1 tablet with snacks three times a day   09/25/2018 at Unknown time  . colchicine 0.6 MG tablet Take 1 tablet (0.6 mg total) by mouth every other day. (Patient taking differently: Take 0.6 mg by mouth every other day. Take 1 tablet every other day) 16 tablet 12 09/25/2018 at Unknown time  . dicyclomine (BENTYL) 10 MG capsule Take 1 capsule (10 mg total) by mouth 4 (four) times daily -  before meals and at bedtime. (Patient taking differently: Take 10 mg by mouth 4 (four)  times daily. ) 120 capsule 3 09/25/2018 at Unknown time  . gabapentin (NEURONTIN) 300 MG capsule Take 300 mg by mouth at bedtime.    09/25/2018 at Unknown time  . HYDROcodone-acetaminophen (NORCO) 10-325 MG tablet Take 1 tablet by mouth every 6 (six) hours as needed for moderate pain. 6 tablet 0 09/25/2018 at Unknown time  . Insulin Glargine (LANTUS SOLOSTAR) 100 UNIT/ML Solostar Pen Inject 30 Units into the skin every morning. And pen needles 1/day 15 mL 11 09/25/2018 at Unknown time  . lidocaine-prilocaine (EMLA) cream Apply 1 application topically as needed (topical anesthesia  for hemodialysis if Gebauers and Lidocaine injection are ineffective.). 30 g 0 unknown  . losartan (COZAAR) 25 MG tablet Take 25 mg by mouth daily.   09/25/2018 at Unknown time  . PARoxetine (PAXIL) 20 MG tablet Take 1 daily. This is to prevent panic attacks (Patient taking differently: Take 20-40 mg by mouth daily. This is to prevent panic attacks) 30 tablet 12 09/25/2018 at Unknown time  . torsemide (DEMADEX) 100 MG tablet Take 100 mg by mouth daily.    09/25/2018 at Unknown time  . UNKNOWN TO PATIENT Take 1 tablet by mouth daily. Steroid course starting on 09/24/2018   09/25/2018 at Unknown time   Scheduled: . albuterol  2.5 mg Nebulization QID  . atorvastatin  20 mg Oral QHS  . calcium acetate  1,334 mg Oral TID WC  . Chlorhexidine Gluconate Cloth  6 each Topical Q0600  . colchicine  0.6 mg Oral Q48H  . dicyclomine  10 mg Oral TID AC & HS  . doxercalciferol  1.5 mcg Intravenous Q M,W,F-HD  . epoetin (EPOGEN/PROCRIT) injection  8,000 Units Intravenous Q M,W,F-HD  . heparin  5,000 Units Subcutaneous Q8H  . insulin aspart  0-9 Units Subcutaneous TID WC  . insulin glargine  30 Units Subcutaneous BH-q7a  . losartan  25 mg Oral Daily  . torsemide  100 mg Oral Daily   Continuous: . sodium chloride    . sodium chloride    . sodium chloride Stopped (09/27/18 2309)  . cefTRIAXone (ROCEPHIN)  IV 1 g (09/27/18 1812)    TXH:FSFSEL chloride, sodium chloride, sodium chloride, acetaminophen **OR** acetaminophen, alteplase, heparin, lidocaine (PF), lidocaine-prilocaine, ondansetron **OR** ondansetron (ZOFRAN) IV, pentafluoroprop-tetrafluoroeth, polyethylene glycol  Assesment: She was admitted with metabolic encephalopathy.  This is thought to be related to missing dialysis sessions x2.  She is better after dialysis yesterday but she is not back to baseline yet.  She has end-stage renal failure on dialysis  She has a vague finding in the right lower lobe area and she is been started on antibiotics Active Problems:   Metabolic encephalopathy    Plan: Continue current treatments.  Discussed with Dr. Justin Mend from nephrology and plan for dialysis tomorrow    LOS: 1 day   Alonza Bogus 09/28/2018, 9:01 AM

## 2018-09-28 NOTE — Progress Notes (Signed)
[facility] ROUNDING NOTE   Subjective:   [name] who had missed several dialysis treatments Wednesday and Friday last week.  She was seen in the emergency room at [facility] 09/26/2018 with some confusion.  She had a dialysis treatment 09/27/2018.  She tolerated this well and had 3.3 L of ultrafiltration.  She is currently on empiric Rocephin 1 g every 24 hours.  Her confusion seems to be a little better this morning she is oriented to place and person but not to time.  Blood pressure 157/81 pulse 72 temperature 98 O2 sats 100% on room air  MRSA PCR screen negative sodium 142 potassium 4.7 chloride 9109 CO2 18 BUN 56 creatinine 9.67 calcium 8.8 phosphorus 8.8 albumin 3.2 WBC 6.1 hemoglobin 9.1 platelets 147    Objective:  Vital signs in last 24 hours:  Temp:  [98 F (36.7 C)-99.1 F (37.3 C)] 98 F (36.7 C) (03/03 7322) Pulse Rate:  [63-80] 72 (03/03 0638) Resp:  [18-20] 18 (03/03 0254) BP: (117-200)/(59-103) 157/81 (03/03 2706) SpO2:  [95 %-100 %] 100 % (03/03 0804) Weight:  [156.9 kg-161.5 kg] 156.9 kg (03/03 0600)  Weight change: 2.741 kg Filed Weights   09/27/18 1300 09/27/18 1720 09/28/18 0600  Weight: (!) 161.5 kg (!) 158.1 kg (!) 156.9 kg    Intake/Output: I/O last 3 completed shifts: In: 485.2 [P.O.:240; I.V.:45.2; IV Piggyback:200] Out: 3302 [Emesis/NG output:1; Other:3300; Stool:1]   Intake/Output this shift:  No intake/output data recorded. Chronically ill-appearing lady that was disoriented to time but oriented to place CVS- RRR left IJ catheter RS- CTA some rhonchorous breath sounds ABD- BS present soft non-distended EXT- no edema palpable AV fistula with thrill and bruit Skin she had a warty lesion on her great toe left foot was noninfected   Basic Metabolic Panel: Recent Labs  Lab 09/26/18 1345 09/26/18 2251  NA 142 142  K 4.6 4.7  CL 108 109  CO2 20* 18*  GLUCOSE 363* 235*  BUN 57* 56*  CREATININE 9.65*  9.67*  CALCIUM 8.6* 8.8*  PHOS  --  8.8*    Liver Function Tests: Recent Labs  Lab 09/26/18 1345 09/26/18 2251  AST 19  --   ALT 12  --   ALKPHOS 87  --   BILITOT 0.4  --   PROT 7.1  --   ALBUMIN 3.4* 3.2*   No results for input(s): LIPASE, AMYLASE in the last 168 hours. No results for input(s): AMMONIA in the last 168 hours.  CBC: Recent Labs  Lab 09/26/18 1345 09/26/18 2251  WBC 5.7 6.1  NEUTROABS 4.0  --   HGB 9.0* 9.1*  HCT 29.7* 30.1*  MCV 92.5 94.1  PLT 148* 147*    Cardiac Enzymes: No results for input(s): CKTOTAL, CKMB, CKMBINDEX, TROPONINI in the last 168 hours.  BNP: Invalid input(s): POCBNP  CBG: Recent Labs  Lab 09/27/18 0730 09/27/18 1139 09/27/18 1558 09/27/18 2139 09/28/18 0731  GLUCAP 258* 295* 121* 172* 186*    Microbiology: Results for orders placed or performed during the hospital encounter of 09/26/18  MRSA PCR Screening     Status: None   Collection Time: 09/27/18  3:08 AM  Result Value Ref Range Status   MRSA by PCR NEGATIVE NEGATIVE Final    Comment:        The GeneXpert MRSA Assay (FDA approved for NASAL specimens only), is one component of a comprehensive MRSA colonization surveillance program. It is not intended to diagnose MRSA  infection nor to guide or monitor treatment for MRSA infections. Performed at [facility], [address], [place], [place] [address]     Coagulation Studies: No results for input(s): LABPROT, INR in the last 72 hours.  Urinalysis: Recent Labs    09/26/18 1338  COLORURINE YELLOW  LABSPEC 1.014  PHURINE 6.0  GLUCOSEU >=500*  HGBUR SMALL*  BILIRUBINUR NEGATIVE  KETONESUR NEGATIVE  PROTEINUR >=300*  NITRITE NEGATIVE  LEUKOCYTESUR TRACE*      Imaging: Dg Chest 2 View  Result Date: 09/26/2018 CLINICAL DATA:  Altered mental status EXAM: CHEST - 2 VIEW COMPARISON:  Chest radiograph earlier today and prior studies FINDINGS: Cardiomegaly with pulmonary vascular congestion noted.  Possible RIGHT lung opacity on earlier study is no longer present. No pleural effusion or pneumothorax. RIGHT IJ central venous catheter with tip overlying the LOWER SVC again noted. IMPRESSION: Cardiomegaly with pulmonary vascular congestion. Electronically Signed   By: Margarette Canada M.D.   On: 09/26/2018 16:30   Ct Head Wo Contrast  Result Date: 09/26/2018 CLINICAL DATA:  53 year old female with altered level of consciousness, confusion and slurred speech for 1 day. EXAM: CT HEAD WITHOUT CONTRAST TECHNIQUE: Contiguous axial images were obtained from the base of the skull through the vertex without intravenous contrast. COMPARISON:  12/13/2017 CT and prior studies FINDINGS: Brain: No evidence of acute infarction, hemorrhage, hydrocephalus, extra-axial collection or mass lesion/mass effect. Mild chronic small-vessel white matter ischemic changes again noted. Vascular: No hyperdense vessel or unexpected calcification. Skull: Normal. Negative for fracture or focal lesion. Sinuses/Orbits: LEFT globe is now low-density with apparent internal gas. The paranasal sinuses and mastoid air cells are clear. Other: None. IMPRESSION: 1. No evidence of acute intracranial abnormality. 2. Mild chronic small-vessel white matter ischemic changes 3. LEFT globe is now low-density with apparent internal gas - correlate clinically and with any recent surgery. Electronically Signed   By: Margarette Canada M.D.   On: 09/26/2018 15:06   Dg Chest Portable 1 View  Result Date: 09/26/2018 CLINICAL DATA:  Shortness of breath.  Slurred speech and confusion. EXAM: PORTABLE CHEST 1 VIEW COMPARISON:  09/15/2018 FINDINGS: There is a right sided dialysis catheter with tip projecting over the cavoatrial junction. Cardiac enlargement identified. Diffuse pulmonary vascular congestion. There is asymmetric opacification overlying the right midlung and right base. This is favored to represent breast artifact. Pneumonia or asymmetric edema is considered less  favored. IMPRESSION: 1. Pulmonary vascular congestion. 2. Asymmetric opacification in the right midlung and right base is favored to represent breast artifact. Advise repeat chest radiograph in the upright PA lateral orientation as patient's clinical condition tolerates to reassess aeration to the right lower lung. Electronically Signed   By: Kerby Moors M.D.   On: 09/26/2018 14:59     Medications:   . sodium chloride    . sodium chloride    . sodium chloride Stopped (09/27/18 2309)  . cefTRIAXone (ROCEPHIN)  IV 1 g (09/27/18 1812)   . albuterol  2.5 mg Nebulization QID  . atorvastatin  20 mg Oral QHS  . calcium acetate  1,334 mg Oral TID WC  . Chlorhexidine Gluconate Cloth  6 each Topical Q0600  . colchicine  0.6 mg Oral Q48H  . dicyclomine  10 mg Oral TID AC & HS  . doxercalciferol  1.5 mcg Intravenous Q M,W,F-HD  . epoetin (EPOGEN/PROCRIT) injection  8,000 Units Intravenous Q M,W,F-HD  . heparin  5,000 Units Subcutaneous Q8H  . insulin aspart  0-9 Units Subcutaneous TID WC  .  insulin glargine  30 Units Subcutaneous BH-q7a  . losartan  25 mg Oral Daily  . torsemide  100 mg Oral Daily   sodium chloride, sodium chloride, sodium chloride, acetaminophen **OR** acetaminophen, alteplase, heparin, lidocaine (PF), lidocaine-prilocaine, ondansetron **OR** ondansetron (ZOFRAN) IV, pentafluoroprop-tetrafluoroeth, polyethylene glycol  Assessment/ Plan:   ESRD-Monday Wednesday Friday dialysis follow-up with Dr. Lowanda Foster dialyzes at [facility] unit.  Has missed several dialysis treatments plan dialysis on schedule tomorrow 09/29/2018  ANEMIA-T sats 15% we will start IV iron series.  MBD-hyperphosphatemia noted started PhosLo 2 tablets with meals  HTN/VOL-challenge dry weight appears to be doing well  Diabetes mellitus appreciate assistance from Dr. Luan Pulling  Altered mental status unremarkable head CT working diagnosis toxic metabolic causes gabapentin has been discontinued.  She continues to  slowly improve.  Empiric antibiotics have been discontinued Rocephin   LOS: 1 Sherril Croon @TODAY @9 :07 AM

## 2018-09-28 NOTE — Progress Notes (Signed)
Patient complained of legs hurting and requested SCD's. Paged mid level. Mid level placed order for SCD's. Will continue to monitor patient throughout shift.

## 2018-09-29 DIAGNOSIS — E1165 Type 2 diabetes mellitus with hyperglycemia: Secondary | ICD-10-CM

## 2018-09-29 DIAGNOSIS — Z794 Long term (current) use of insulin: Secondary | ICD-10-CM

## 2018-09-29 DIAGNOSIS — I10 Essential (primary) hypertension: Secondary | ICD-10-CM

## 2018-09-29 DIAGNOSIS — Z992 Dependence on renal dialysis: Secondary | ICD-10-CM

## 2018-09-29 DIAGNOSIS — N186 End stage renal disease: Secondary | ICD-10-CM

## 2018-09-29 DIAGNOSIS — Z9001 Acquired absence of eye: Secondary | ICD-10-CM

## 2018-09-29 DIAGNOSIS — G4733 Obstructive sleep apnea (adult) (pediatric): Secondary | ICD-10-CM

## 2018-09-29 DIAGNOSIS — F418 Other specified anxiety disorders: Secondary | ICD-10-CM

## 2018-09-29 LAB — GLUCOSE, CAPILLARY
Glucose-Capillary: 179 mg/dL — ABNORMAL HIGH (ref 70–99)
Glucose-Capillary: 180 mg/dL — ABNORMAL HIGH (ref 70–99)
Glucose-Capillary: 175 mg/dL — ABNORMAL HIGH (ref 70–99)

## 2018-09-29 MED ORDER — DICYCLOMINE HCL 10 MG PO CAPS: 10.0000 mg | ORAL_CAPSULE | Freq: Four times a day (QID) | ORAL | Status: DC

## 2018-09-29 MED ORDER — ALPRAZOLAM 1 MG PO TABS: 0.5000 mg | ORAL_TABLET | Freq: Two times a day (BID) | ORAL | 0 refills | Status: DC | PRN

## 2018-09-29 MED ORDER — CEFDINIR 300 MG PO CAPS: 300.0000 mg | ORAL_CAPSULE | ORAL | 0 refills | Status: AC

## 2018-09-29 MED ORDER — COLCHICINE 0.6 MG PO TABS: 0.6000 mg | ORAL_TABLET | ORAL | Status: DC

## 2018-09-29 NOTE — Progress Notes (Signed)
[facility] ROUNDING NOTE   Subjective:   [name] who had missed several dialysis treatments Wednesday and Friday last week.  She was seen in the emergency room at [facility] 09/26/2018 with some confusion.  She had a dialysis treatment 09/27/2018.  She tolerated this well and had 3.3 L of ultrafiltration.  She is currently on empiric Rocephin 1 g every 24 hours.  Her confusion is much improved she is now oriented to time place and person  Blood pressure 145/71 pulse 81 temperature 98.3 O2 sats 95% room air  Last renal panel was drawn on 09/26/2018 sodium 142 potassium 4.7 chloride 109 CO2 18 glucose 235 BUN 56 creatinine 9.67 calcium 8.8 phosphorus 8.8 albumin 3.2.  WBC 6.1 hemoglobin 9.1 platelets 147    Objective:  Vital signs in last 24 hours:  Temp:  [98 F (36.7 C)-98.7 F (37.1 C)] 98.3 F (36.8 C) (03/04 0846) Pulse Rate:  [70-92] 81 (03/04 0846) Resp:  [18-20] 20 (03/04 0400) BP: (99-145)/(59-85) 145/71 (03/04 0846) SpO2:  [93 %-98 %] 95 % (03/04 0846)  Weight change:  Filed Weights   09/27/18 1300 09/27/18 1720 09/28/18 0600  Weight: (!) 161.5 kg (!) 158.1 kg (!) 156.9 kg    Intake/Output: I/O last 3 completed shifts: In: 502.5 [P.O.:240; I.V.:44; IV Piggyback:218.5] Out: -    Intake/Output this shift:  No intake/output data recorded. Chronically ill-appearing lady that was oriented to time place person CVS- RRR left IJ catheter RS- CTA some rhonchorous breath sounds ABD- BS present soft non-distended EXT- no edema palpable AV fistula with thrill and bruit Skin she had a warty lesion on her great toe left foot was noninfected   Basic Metabolic Panel: Recent Labs  Lab 09/26/18 1345 09/26/18 2251  NA 142 142  K 4.6 4.7  CL 108 109  CO2 20* 18*  GLUCOSE 363* 235*  BUN 57* 56*  CREATININE 9.65* 9.67*  CALCIUM 8.6* 8.8*  PHOS  --  8.8*    Liver Function Tests: Recent Labs  Lab 09/26/18 1345 09/26/18 2251  AST  19  --   ALT 12  --   ALKPHOS 87  --   BILITOT 0.4  --   PROT 7.1  --   ALBUMIN 3.4* 3.2*   No results for input(s): LIPASE, AMYLASE in the last 168 hours. No results for input(s): AMMONIA in the last 168 hours.  CBC: Recent Labs  Lab 09/26/18 1345 09/26/18 2251  WBC 5.7 6.1  NEUTROABS 4.0  --   HGB 9.0* 9.1*  HCT 29.7* 30.1*  MCV 92.5 94.1  PLT 148* 147*    Cardiac Enzymes: No results for input(s): CKTOTAL, CKMB, CKMBINDEX, TROPONINI in the last 168 hours.  BNP: Invalid input(s): POCBNP  CBG: Recent Labs  Lab 09/28/18 1042 09/28/18 1602 09/28/18 2107 09/29/18 0621 09/29/18 0722  GLUCAP 243* 180* 163* 179* 180*    Microbiology: Results for orders placed or performed during the hospital encounter of 09/26/18  MRSA PCR Screening     Status: None   Collection Time: 09/27/18  3:08 AM  Result Value Ref Range Status   MRSA by PCR NEGATIVE NEGATIVE Final    Comment:        The GeneXpert MRSA Assay (FDA approved for NASAL specimens only), is one component of a comprehensive MRSA colonization surveillance program. It is not intended to diagnose MRSA infection nor to guide or monitor treatment for MRSA infections. Performed at [facility], [place]  [address] [place], [place] [address]     Coagulation Studies: No results for input(s): LABPROT, INR in the last 72 hours.  Urinalysis: Recent Labs    09/26/18 1338  COLORURINE YELLOW  LABSPEC 1.014  PHURINE 6.0  GLUCOSEU >=500*  HGBUR SMALL*  BILIRUBINUR NEGATIVE  KETONESUR NEGATIVE  PROTEINUR >=300*  NITRITE NEGATIVE  LEUKOCYTESUR TRACE*      Imaging: Dg Chest 2 View  Result Date: 09/28/2018 CLINICAL DATA:  Weakness after missing dialysis.  COPD EXAM: CHEST - 2 VIEW COMPARISON:  09/26/2018 FINDINGS: There is a right subclavian dialysis catheter with tip projecting over the cavoatrial junction. Heart size normal. No pleural effusion or edema. No airspace opacities IMPRESSION: 1. No active cardiopulmonary  abnormalities. No evidence for pulmonary edema. Electronically Signed   By: Kerby Moors M.D.   On: 09/28/2018 12:13     Medications:   . sodium chloride    . sodium chloride    . sodium chloride Stopped (09/27/18 2309)  . cefTRIAXone (ROCEPHIN)  IV 1 g (09/28/18 2107)  . ferric gluconate (FERRLECIT/NULECIT) IV 125 mg (09/28/18 1443)   . albuterol  2.5 mg Nebulization QID  . atorvastatin  20 mg Oral QHS  . calcium acetate  1,334 mg Oral TID WC  . Chlorhexidine Gluconate Cloth  6 each Topical Q0600  . Chlorhexidine Gluconate Cloth  6 each Topical Q0600  . colchicine  0.6 mg Oral Q48H  . dicyclomine  10 mg Oral TID AC & HS  . doxercalciferol  1.5 mcg Intravenous Q M,W,F-HD  . epoetin (EPOGEN/PROCRIT) injection  8,000 Units Intravenous Q M,W,F-HD  . heparin  5,000 Units Subcutaneous Q8H  . insulin aspart  0-9 Units Subcutaneous TID WC  . insulin glargine  30 Units Subcutaneous BH-q7a  . losartan  25 mg Oral Daily  . torsemide  100 mg Oral Daily   sodium chloride, sodium chloride, sodium chloride, acetaminophen **OR** acetaminophen, alteplase, heparin, lidocaine (PF), lidocaine-prilocaine, ondansetron **OR** ondansetron (ZOFRAN) IV, pentafluoroprop-tetrafluoroeth, polyethylene glycol  Assessment/ Plan:   ESRD-Monday Wednesday Friday dialysis follow-up with Dr. Lowanda Foster dialyzes at [facility] unit.  Has missed several dialysis treatments she is scheduled for dialysis 09/29/2018.  She is receiving dialysis at this particular time and has been seen during her treatment.  I think it is reasonable that she is discharged after dialysis  Anemia and deficiency has been started on IV iron  MBD-hyperphosphatemia noted started PhosLo 2 tablets with meals  HTN/VOL-challenge dry weight appears to be doing well  Diabetes mellitus appreciate assistance from Dr. Luan Pulling  Altered mental status unremarkable head CT working diagnosis toxic metabolic causes gabapentin has been discontinued.  Improvement  has been very marked I am not sure that she was uremic and this may be related to gabapentin use.  Would recommend discontinuation Garbapentin an outpatient.    She was begun on empiric antibiotic treatment with Rocephin 1 g q. a 24 hours.  There is no evidence of infection  She has a warty growth on her left toe.  This needs to be evaluated.  Differential would include squamous cell carcinoma   LOS: 2 Sherril Croon @TODAY @9 :69 AM

## 2018-09-29 NOTE — Procedures (Signed)
    HEMODIALYSIS TREATMENT NOTE:  4 hour heparin-free dialysis completed via right IJ tunneled catheter. Goal met: 2.5 liters removed without interruption in ultrafiltration.  All blood was returned.  Rockwell Alexandria, RN

## 2018-09-29 NOTE — Plan of Care (Signed)

## 2018-09-29 NOTE — Plan of Care (Signed)
  Problem: Education: Goal: Knowledge of General Education information will improve Description Including pain rating scale, medication(s)/side effects and non-pharmacologic comfort measures 09/29/2018 1431 by Rance Muir, RN Outcome: Adequate for Discharge 09/29/2018 1010 by Rance Muir, RN Outcome: Progressing   Problem: Health Behavior/Discharge Planning: Goal: Ability to manage health-related needs will improve 09/29/2018 1431 by Rance Muir, RN Outcome: Adequate for Discharge 09/29/2018 1010 by Rance Muir, RN Outcome: Progressing   Problem: Clinical Measurements: Goal: Ability to maintain clinical measurements within normal limits will improve 09/29/2018 1431 by Rance Muir, RN Outcome: Adequate for Discharge 09/29/2018 1010 by Rance Muir, RN Outcome: Progressing Goal: Will remain free from infection 09/29/2018 1431 by Rance Muir, RN Outcome: Adequate for Discharge 09/29/2018 1010 by Rance Muir, RN Outcome: Progressing Goal: Diagnostic test results will improve 09/29/2018 1431 by Rance Muir, RN Outcome: Adequate for Discharge 09/29/2018 1010 by Rance Muir, RN Outcome: Progressing Goal: Respiratory complications will improve 09/29/2018 1431 by Rance Muir, RN Outcome: Adequate for Discharge 09/29/2018 1010 by Rance Muir, RN Outcome: Progressing Goal: Cardiovascular complication will be avoided 09/29/2018 1431 by Rance Muir, RN Outcome: Adequate for Discharge 09/29/2018 1010 by Rance Muir, RN Outcome: Progressing   Problem: Activity: Goal: Risk for activity intolerance will decrease 09/29/2018 1431 by Rance Muir, RN Outcome: Adequate for Discharge 09/29/2018 1010 by Rance Muir, RN Outcome: Progressing   Problem: Nutrition: Goal: Adequate nutrition will be maintained 09/29/2018 1431 by Rance Muir, RN Outcome: Adequate for Discharge 09/29/2018 1010 by Rance Muir, RN Outcome: Progressing   Problem: Coping: Goal: Level of anxiety will decrease 09/29/2018 1431 by Rance Muir, RN Outcome: Adequate for Discharge 09/29/2018  1010 by Rance Muir, RN Outcome: Progressing   Problem: Elimination: Goal: Will not experience complications related to bowel motility 09/29/2018 1431 by Rance Muir, RN Outcome: Adequate for Discharge 09/29/2018 1010 by Rance Muir, RN Outcome: Progressing Goal: Will not experience complications related to urinary retention 09/29/2018 1431 by Rance Muir, RN Outcome: Adequate for Discharge 09/29/2018 1010 by Rance Muir, RN Outcome: Progressing   Problem: Pain Managment: Goal: General experience of comfort will improve 09/29/2018 1431 by Rance Muir, RN Outcome: Adequate for Discharge 09/29/2018 1010 by Rance Muir, RN Outcome: Progressing   Problem: Safety: Goal: Ability to remain free from injury will improve 09/29/2018 1431 by Rance Muir, RN Outcome: Adequate for Discharge 09/29/2018 1010 by Rance Muir, RN Outcome: Progressing   Problem: Skin Integrity: Goal: Risk for impaired skin integrity will decrease 09/29/2018 1431 by Rance Muir, RN Outcome: Adequate for Discharge 09/29/2018 1010 by Rance Muir, RN Outcome: Progressing

## 2018-09-29 NOTE — Discharge Summary (Addendum)
Physician Discharge Summary  Martha [name]:[id number] DOB: Jun 10, 1966 DOA: 09/26/2018  PCP: Sinda Du, MD  Admit date: 09/26/2018 Discharge date: 09/29/2018  Admitted From: Home Disposition: Home  Recommendations for Outpatient Follow-up:  1. Follow up with PCP in 1-2 weeks 2. Please refer to podiatry, surgery or dermatology to biopsy growth on left toe 3. Resume regular hemodialysis schedule  She has a warty growth on her left toe.  This needs to be evaluated.  Differential would include squamous cell carcinoma   Discharge Condition: STABLE   CODE STATUS: FULL    Brief Hospitalization Summary: Please see all hospital notes, images, labs for full details of the hospitalization. Dr. Talmadge Coventry HPI: Martha [name] is a 53 y.o. female with medical history significant for ESRD, HTN, OSA, restless leg syndrome, depression, bipolar disorder, COPD, who presented to the ED reports of slurred speech and intermittent confusion since last night at about 9 PM.  [name] is present at bedside and helps with the history.  Patient is aware her speech was slurred. She denies any weakness of her extremity.  Fianc present at bedside said patient's response to answers with delayed, and she would keep her repeating same questions and answers.  Patient has missed 2 hemodialysis sessions.  Her schedule is Monday Wednesday Friday.  Patient missed last Wednesday because the day prior she had to urinate several times, apparently did not like this when this happens during dialysis, so she did not go.  On Friday she had back pain for which she was given steroids, so she did not go.  Patient reports some cough, and difficulty breathing, no fevers or chills.  Chest pain.  No leg swelling.  She still makes urine.  ED Course: Systolic 154M to 086P.  On room air.  Blood alcohol less than 10.  BUN elevated 57.  Potassium within normal limits 4.6.  EKG nonspecific T wave changes in lead I and II.  Portable chest x-ray  pulmonary vascular congestion, upright AP lateral suggested, subsequently done showed pulmonary vascular congestion. EDP talked to nephrologist on-call who recommended admission here for HD in a.m.  No need for urgent HD tonight.  The patient was admitted to the hospital with altered mentation and confusion likely medication related and also associated with her missing several dialysis treatment sessions on Wednesday and Friday last week.  She had a dialysis treatments in the hospital which improved her situation.  She received 2 separate treatments.  Her confusion is much improved and she is oriented to time place and person.  The patient was taken off of her sedatives.  In addition, her alprazolam has been reduced by 50% to 0.5 mg twice daily as needed, BuSpar has been discontinued secondary to its relative contraindication for end-stage renal disease patients, and also gabapentin was discontinued as recommended by the nephrologist.  These measures were taken to try and avoid no recurrence in her altered mental status.  The patient was strongly advised to avoid missing dialysis treatments.  In addition the patient was noted to have a warty growth on her left toe.  She was strongly advised to see a podiatrist, surgeon or dermatologist to have this biopsied as it could be a squamous cell cancer.  The patient verbalized understanding. Chronic diastolic CHF.   Discharge Diagnoses:  Active Problems:   Morbid obesity ([place])   Depression with anxiety   Essential hypertension   GERD   Sleep apnea   Diabetes mellitus, type 2 ([place])   Uncontrolled type  2 diabetes mellitus with hyperglycemia, without long-term current use of insulin ([facility])   Bipolar 1 disorder ([facility])   ESRD (end stage renal disease) on dialysis ([place])   History of enucleation of left eyeball   Metabolic encephalopathy   Discharge Instructions: Discharge Instructions    Call MD for:  difficulty breathing, headache or visual disturbances    Complete by:  As directed    Call MD for:  extreme fatigue   Complete by:  As directed    Call MD for:  persistant dizziness or light-headedness   Complete by:  As directed    Call MD for:  persistant nausea and vomiting   Complete by:  As directed    Call MD for:  severe uncontrolled pain   Complete by:  As directed    Increase activity slowly   Complete by:  As directed      Allergies as of 09/29/2018      Reactions   Codeine Nausea And Vomiting   Tape Rash   Pull skin off.  Paper tape is ok      Medication List    STOP taking these medications   baclofen 10 MG tablet Commonly known as:  LIORESAL   busPIRone 7.5 MG tablet Commonly known as:  BUSPAR   gabapentin 300 MG capsule Commonly known as:  NEURONTIN   HYDROcodone-acetaminophen 10-325 MG tablet Commonly known as:  NORCO     TAKE these medications   albuterol (2.5 MG/3ML) 0.083% nebulizer solution Commonly known as:  PROVENTIL Take 2.5 mg by nebulization 4 (four) times daily.   ALPRAZolam 1 MG tablet Commonly known as:  XANAX Take 0.5 tablets (0.5 mg total) by mouth 2 (two) times daily as needed for anxiety (nerves). What changed:    how much to take  when to take this  reasons to take this   atorvastatin 20 MG tablet Commonly known as:  LIPITOR Take 20 mg by mouth daily.   calcium carbonate 750 MG chewable tablet Commonly known as:  TUMS EX Chew 1-3 tablets by mouth See admin instructions. Patient takes 3 tablets with meals and 1 tablet with snacks three times a day   cefdinir 300 MG capsule Commonly known as:  OMNICEF Take 1 capsule (300 mg total) by mouth every other day for 2 days.   colchicine 0.6 MG tablet Take 1 tablet (0.6 mg total) by mouth every Tuesday, Thursday, and Saturday at 6 PM. Take 1 tablet every other day Start taking on:  September 30, 2018   dicyclomine 10 MG capsule Commonly known as:  BENTYL Take 1 capsule (10 mg total) by mouth 4 (four) times daily.   Insulin Glargine  100 UNIT/ML Solostar Pen Commonly known as:  LANTUS SOLOSTAR Inject 30 Units into the skin every morning. And pen needles 1/day   lidocaine-prilocaine cream Commonly known as:  EMLA Apply 1 application topically as needed (topical anesthesia for hemodialysis if Gebauers and Lidocaine injection are ineffective.).   losartan 25 MG tablet Commonly known as:  COZAAR Take 25 mg by mouth daily.   omeprazole 40 MG capsule Commonly known as:  PRILOSEC Take 40 mg by mouth daily.   PARoxetine 20 MG tablet Commonly known as:  PAXIL Take 1 daily. This is to prevent panic attacks What changed:    how much to take  how to take this  when to take this  additional instructions   torsemide 100 MG tablet Commonly known as:  DEMADEX Take 100 mg by mouth  daily.      Follow-up Information    Sinda Du, MD. Schedule an appointment as soon as possible for a visit in 1 week(s).   Specialty:  Pulmonary Disease Contact information: [place] [address] [phone]        Fran Lowes, MD. Schedule an appointment as soon as possible for a visit in 1 week(s).   Specialty:  Nephrology Contact information: [address] [address] [phone]        [facility]. Schedule an appointment as soon as possible for a visit in 2 week(s).   Why:  Schedule appt to have biopsy on growth on foot.  Contact information: [place] [address] [phone]         Allergies  Allergen Reactions  . Codeine Nausea And Vomiting  . Tape Rash    Pull skin off.  Paper tape is ok   Allergies as of 09/29/2018      Reactions   Codeine Nausea And Vomiting   Tape Rash   Pull skin off.  Paper tape is ok      Medication List    STOP taking these medications   baclofen 10 MG tablet Commonly known as:  LIORESAL   busPIRone 7.5 MG tablet Commonly known as:  BUSPAR   gabapentin  300 MG capsule Commonly known as:  NEURONTIN   HYDROcodone-acetaminophen 10-325 MG tablet Commonly known as:  NORCO     TAKE these medications   albuterol (2.5 MG/3ML) 0.083% nebulizer solution Commonly known as:  PROVENTIL Take 2.5 mg by nebulization 4 (four) times daily.   ALPRAZolam 1 MG tablet Commonly known as:  XANAX Take 0.5 tablets (0.5 mg total) by mouth 2 (two) times daily as needed for anxiety (nerves). What changed:    how much to take  when to take this  reasons to take this   atorvastatin 20 MG tablet Commonly known as:  LIPITOR Take 20 mg by mouth daily.   calcium carbonate 750 MG chewable tablet Commonly known as:  TUMS EX Chew 1-3 tablets by mouth See admin instructions. Patient takes 3 tablets with meals and 1 tablet with snacks three times a day   cefdinir 300 MG capsule Commonly known as:  OMNICEF Take 1 capsule (300 mg total) by mouth every other day for 2 days.   colchicine 0.6 MG tablet Take 1 tablet (0.6 mg total) by mouth every Tuesday, Thursday, and Saturday at 6 PM. Take 1 tablet every other day Start taking on:  September 30, 2018   dicyclomine 10 MG capsule Commonly known as:  BENTYL Take 1 capsule (10 mg total) by mouth 4 (four) times daily.   Insulin Glargine 100 UNIT/ML Solostar Pen Commonly known as:  LANTUS SOLOSTAR Inject 30 Units into the skin every morning. And pen needles 1/day   lidocaine-prilocaine cream Commonly known as:  EMLA Apply 1 application topically as needed (topical anesthesia for hemodialysis if Gebauers and Lidocaine injection are ineffective.).   losartan 25 MG tablet Commonly known as:  COZAAR Take 25 mg by mouth daily.   omeprazole 40 MG capsule Commonly known as:  PRILOSEC Take 40 mg by mouth daily.   PARoxetine 20 MG tablet Commonly known as:  PAXIL Take 1 daily. This is to prevent panic attacks What changed:    how much to take  how to take this  when to take this  additional instructions    torsemide 100 MG  tablet Commonly known as:  DEMADEX Take 100 mg by mouth daily.       Procedures/Studies: Dg Chest 2 View  Result Date: 09/28/2018 CLINICAL DATA:  Weakness after missing dialysis.  COPD EXAM: CHEST - 2 VIEW COMPARISON:  09/26/2018 FINDINGS: There is a right subclavian dialysis catheter with tip projecting over the cavoatrial junction. Heart size normal. No pleural effusion or edema. No airspace opacities IMPRESSION: 1. No active cardiopulmonary abnormalities. No evidence for pulmonary edema. Electronically Signed   By: Kerby Moors M.D.   On: 09/28/2018 12:13   Dg Chest 2 View  Result Date: 09/26/2018 CLINICAL DATA:  Altered mental status EXAM: CHEST - 2 VIEW COMPARISON:  Chest radiograph earlier today and prior studies FINDINGS: Cardiomegaly with pulmonary vascular congestion noted. Possible RIGHT lung opacity on earlier study is no longer present. No pleural effusion or pneumothorax. RIGHT IJ central venous catheter with tip overlying the LOWER SVC again noted. IMPRESSION: Cardiomegaly with pulmonary vascular congestion. Electronically Signed   By: Margarette Canada M.D.   On: 09/26/2018 16:30   Dg Chest 2 View  Result Date: 09/15/2018 CLINICAL DATA:  53 y/o  F; rule out infection. EXAM: CHEST - 2 VIEW COMPARISON:  07/26/2018 chest radiograph FINDINGS: Stable cardiac silhouette within normal limits given projection and technique. Right central venous catheter tip projects over lower SVC and is stable. No focal consolidation. No pleural effusion or pneumothorax. No acute osseous abnormality is evident. IMPRESSION: No acute pulmonary process identified. Electronically Signed   By: Kristine Garbe M.D.   On: 09/15/2018 13:57   Ct Head Wo Contrast  Result Date: 09/26/2018 CLINICAL DATA:  53 year old female with altered level of consciousness, confusion and slurred speech for 1 day. EXAM: CT HEAD WITHOUT CONTRAST TECHNIQUE: Contiguous axial images were obtained from the base of  the skull through the vertex without intravenous contrast. COMPARISON:  12/13/2017 CT and prior studies FINDINGS: Brain: No evidence of acute infarction, hemorrhage, hydrocephalus, extra-axial collection or mass lesion/mass effect. Mild chronic small-vessel white matter ischemic changes again noted. Vascular: No hyperdense vessel or unexpected calcification. Skull: Normal. Negative for fracture or focal lesion. Sinuses/Orbits: LEFT globe is now low-density with apparent internal gas. The paranasal sinuses and mastoid air cells are clear. Other: None. IMPRESSION: 1. No evidence of acute intracranial abnormality. 2. Mild chronic small-vessel white matter ischemic changes 3. LEFT globe is now low-density with apparent internal gas - correlate clinically and with any recent surgery. Electronically Signed   By: Margarette Canada M.D.   On: 09/26/2018 15:06   Dg Chest Portable 1 View  Result Date: 09/26/2018 CLINICAL DATA:  Shortness of breath.  Slurred speech and confusion. EXAM: PORTABLE CHEST 1 VIEW COMPARISON:  09/15/2018 FINDINGS: There is a right sided dialysis catheter with tip projecting over the cavoatrial junction. Cardiac enlargement identified. Diffuse pulmonary vascular congestion. There is asymmetric opacification overlying the right midlung and right base. This is favored to represent breast artifact. Pneumonia or asymmetric edema is considered less favored. IMPRESSION: 1. Pulmonary vascular congestion. 2. Asymmetric opacification in the right midlung and right base is favored to represent breast artifact. Advise repeat chest radiograph in the upright PA lateral orientation as patient's clinical condition tolerates to reassess aeration to the right lower lung. Electronically Signed   By: Kerby Moors M.D.   On: 09/26/2018 14:59   Vas US Duplex Dialysis Access (avf, Avg)  Result Date: 09/23/2018 DIALYSIS ACCESS Reason for Exam: Routine follow up. Access Site: Left Upper Extremity. Access Type: Basilic  vein transposition. History: 08/03/2018: left av fistula creation          06/08/2018: ligation of av fistula (right). Comparison Study: This is the first post-operative study. Performing Technologist: Burley Saver RVT  Examination Guidelines: A complete evaluation includes B-mode imaging, spectral Doppler, color Doppler, and power Doppler as needed of all accessible portions of each vessel. Unilateral testing is considered an integral part of a complete examination. Limited examinations for reoccurring indications may be performed as noted.  Findings: +--------------------+----------+-----------------+--------+ AVF                 PSV (cm/s)Flow Vol (mL/min)Comments +--------------------+----------+-----------------+--------+ Native artery inflow   359          1918                +--------------------+----------+-----------------+--------+ AVF Anastomosis        318                              +--------------------+----------+-----------------+--------+  +------------+----------+-------------+----------+--------+ OUTFLOW VEINPSV (cm/s)Diameter (cm)Depth (cm)Describe +------------+----------+-------------+----------+--------+ Prox UA        159        0.61        2.71            +------------+----------+-------------+----------+--------+ Mid UA         173        0.55        2.63            +------------+----------+-------------+----------+--------+ Dist UA        106        0.81        0.55            +------------+----------+-------------+----------+--------+ AC Fossa       288        0.72        0.77            +------------+----------+-------------+----------+--------+   Summary: Patent left arteriovenous fistula with no evidence of hemodynamically significant stenosis. Depth of outflow vein increases in the mid-distal upper arm, from 0.55 cm to 2.63 cm.  *See table(s) above for measurements and observations.  Diagnosing physician: Ruta Hinds MD Electronically  signed by Ruta Hinds MD on 09/23/2018 at 3:59:40 PM.    --------------------------------------------------------------------------------   Final       Subjective: The patient is feeling much better.  She is awake alert oriented x3 and wants to go home after her HD treatment.  She tolerated her HD and for feeling much better.  Discharge Exam: Vitals:   09/29/18 1159 09/29/18 1215  BP:  140/78  Pulse:  83  Resp:    Temp:    SpO2: 98%    Vitals:   09/29/18 1115 09/29/18 1145 09/29/18 1159 09/29/18 1215  BP: (!) 155/84 (!) 141/83  140/78  Pulse: 75 76  83  Resp:      Temp:      TempSrc:      SpO2:   98%   Weight:      Height:        General: Pt is alert, awake, not in acute distress, chronically ill-appearing Cardiovascular: RRR, S1/S2 +, no rubs, no gallops Respiratory: CTA bilaterally, no wheezing, no rhonchi Abdominal: Soft, NT, ND, bowel sounds + Extremities: no edema, no cyanosis, warty growth on left great toe left foot   The results of significant diagnostics from this hospitalization (including imaging, microbiology, ancillary and laboratory) are  listed below for reference.     Microbiology: Recent Results (from the past 240 hour(s))  MRSA PCR Screening     Status: None   Collection Time: 09/27/18  3:08 AM  Result Value Ref Range Status   MRSA by PCR NEGATIVE NEGATIVE Final    Comment:        The GeneXpert MRSA Assay (FDA approved for NASAL specimens only), is one component of a comprehensive MRSA colonization surveillance program. It is not intended to diagnose MRSA infection nor to guide or monitor treatment for MRSA infections. Performed at [facility], [address], [place], [place] [address]      Labs: BNP (last 3 results) Recent Labs    02/25/18 2019  BNP 604.5*   Basic Metabolic Panel: Recent Labs  Lab 09/26/18 1345 09/26/18 2251  NA 142 142  K 4.6 4.7  CL 108 109  CO2 20* 18*  GLUCOSE 363* 235*  BUN 57* 56*  CREATININE 9.65*  9.67*  CALCIUM 8.6* 8.8*  PHOS  --  8.8*   Liver Function Tests: Recent Labs  Lab 09/26/18 1345 09/26/18 2251  AST 19  --   ALT 12  --   ALKPHOS 87  --   BILITOT 0.4  --   PROT 7.1  --   ALBUMIN 3.4* 3.2*   No results for input(s): LIPASE, AMYLASE in the last 168 hours. No results for input(s): AMMONIA in the last 168 hours. CBC: Recent Labs  Lab 09/26/18 1345 09/26/18 2251  WBC 5.7 6.1  NEUTROABS 4.0  --   HGB 9.0* 9.1*  HCT 29.7* 30.1*  MCV 92.5 94.1  PLT 148* 147*   Cardiac Enzymes: No results for input(s): CKTOTAL, CKMB, CKMBINDEX, TROPONINI in the last 168 hours. BNP: Invalid input(s): POCBNP CBG: Recent Labs  Lab 09/28/18 1602 09/28/18 2107 09/29/18 0621 09/29/18 0722 09/29/18 1131  GLUCAP 180* 163* 179* 180* 175*   D-Dimer No results for input(s): DDIMER in the last 72 hours. Hgb A1c No results for input(s): HGBA1C in the last 72 hours. Lipid Profile No results for input(s): CHOL, HDL, LDLCALC, TRIG, CHOLHDL, LDLDIRECT in the last 72 hours. Thyroid function studies No results for input(s): TSH, T4TOTAL, T3FREE, THYROIDAB in the last 72 hours.  Invalid input(s): FREET3 Anemia work up No results for input(s): VITAMINB12, FOLATE, FERRITIN, TIBC, IRON, RETICCTPCT in the last 72 hours. Urinalysis    Component Value Date/Time   COLORURINE YELLOW 09/26/2018 1338   APPEARANCEUR CLOUDY (A) 09/26/2018 1338   LABSPEC 1.014 09/26/2018 1338   PHURINE 6.0 09/26/2018 1338   GLUCOSEU >=500 (A) 09/26/2018 1338   HGBUR SMALL (A) 09/26/2018 1338   BILIRUBINUR NEGATIVE 09/26/2018 1338   KETONESUR NEGATIVE 09/26/2018 1338   PROTEINUR >=300 (A) 09/26/2018 1338   UROBILINOGEN 0.2 03/17/2015 2236   NITRITE NEGATIVE 09/26/2018 1338   LEUKOCYTESUR TRACE (A) 09/26/2018 1338   Sepsis Labs Invalid input(s): PROCALCITONIN,  WBC,  LACTICIDVEN Microbiology Recent Results (from the past 240 hour(s))  MRSA PCR Screening     Status: None   Collection Time: 09/27/18   3:08 AM  Result Value Ref Range Status   MRSA by PCR NEGATIVE NEGATIVE Final    Comment:        The GeneXpert MRSA Assay (FDA approved for NASAL specimens only), is one component of a comprehensive MRSA colonization surveillance program. It is not intended to diagnose MRSA infection nor to guide or monitor treatment for MRSA infections. Performed at [facility], [address], [place],  [place] [address]     Time coordinating discharge: 32 minutes   SIGNED:  Irwin Brakeman, MD  [facility] 09/29/2018, 2:30 PM How to contact the [facility] Attending or Consulting provider [place] or covering provider during after hours [place], for this patient?  1. Check the care team in [facility] and look for a) attending/consulting [facility] provider listed and b) the [facility] team listed 2. Log into [email or web address] and use [place]'s universal password to access. If you do not have the password, please contact the hospital operator. 3. Locate the [facility] provider you are looking for under [facility] and page to a number that you can be directly reached. 4. If you still have difficulty reaching the provider, please page the [facility] (Director on Call) for the Hospitalists listed on amion for assistance.

## 2018-09-29 NOTE — Discharge Instructions (Signed)
PLEASE SCHEDULE APPOINTMENT TO SEE A DERMATOLOGIST OR Podiatrist or SURGEON TO HAVE A BIOPSY DONE ON GROWTH ON TOE IN 2 WEEKS.   PLEASE RESUME REGULAR HEMODIALYSIS SCHEDULE AND DO NOT MISS YOUR TREATMENTS.   IMPORTANT INFORMATION: PAY CLOSE ATTENTION   PHYSICIAN DISCHARGE INSTRUCTIONS  Follow with Primary care provider  Sinda Du, MD  and other consultants as instructed your Hospitalist Physician  SEEK MEDICAL CARE OR RETURN TO EMERGENCY ROOM IF SYMPTOMS COME BACK, WORSEN OR NEW PROBLEM DEVELOPS.   Please note: You were cared for by a hospitalist during your hospital stay. Every effort will be made to forward records to your primary care provider.  You can request that your primary care provider send for your hospital records if they have not received them.  Once you are discharged, your primary care physician will handle any further medical issues. Please note that NO REFILLS for any discharge medications will be authorized once you are discharged, as it is imperative that you return to your primary care physician (or establish a relationship with a primary care physician if you do not have one) for your post hospital discharge needs so that they can reassess your need for medications and monitor your lab values.  Please get a complete blood count and chemistry panel checked by your Primary MD at your next visit, and again as instructed by your Primary MD.  Get Medicines reviewed and adjusted: Please take all your medications with you for your next visit with your Primary MD  Laboratory/radiological data: Please request your Primary MD to go over all hospital tests and procedure/radiological results at the follow up, please ask your primary care provider to get all Hospital records sent to his/her office.  In some cases, they will be blood work, cultures and biopsy results pending at the time of your discharge. Please request that your primary care provider follow up on these  results.  If you are diabetic, please bring your blood sugar readings with you to your follow up appointment with primary care.    Please call and make your follow up appointments as soon as possible.    Also Note the following: If you experience worsening of your admission symptoms, develop shortness of breath, life threatening emergency, suicidal or homicidal thoughts you must seek medical attention immediately by calling 911 or calling your MD immediately  if symptoms less severe.  You must read complete instructions/literature along with all the possible adverse reactions/side effects for all the Medicines you take and that have been prescribed to you. Take any new Medicines after you have completely understood and accpet all the possible adverse reactions/side effects.   Do not drive when taking Pain medications or sleeping medications (Benzodiazepines)  Do not take more than prescribed Pain, Sleep and Anxiety Medications. It is not advisable to combine anxiety,sleep and pain medications without talking with your primary care practitioner  Special Instructions: If you have smoked or chewed Tobacco  in the last 2 yrs please stop smoking, stop any regular Alcohol  and or any Recreational drug use.  Wear Seat belts while driving.

## 2018-10-13 ENCOUNTER — Encounter: Payer: Self-pay | Admitting: Podiatry

## 2018-10-13 ENCOUNTER — Other Ambulatory Visit: Payer: Self-pay

## 2018-10-13 ENCOUNTER — Ambulatory Visit (INDEPENDENT_AMBULATORY_CARE_PROVIDER_SITE_OTHER): Payer: Medicaid Other

## 2018-10-13 ENCOUNTER — Ambulatory Visit (INDEPENDENT_AMBULATORY_CARE_PROVIDER_SITE_OTHER): Payer: Medicaid Other | Admitting: Podiatry

## 2018-10-13 VITALS — BP 169/85

## 2018-10-13 DIAGNOSIS — E08621 Diabetes mellitus due to underlying condition with foot ulcer: Secondary | ICD-10-CM

## 2018-10-13 DIAGNOSIS — L97521 Non-pressure chronic ulcer of other part of left foot limited to breakdown of skin: Secondary | ICD-10-CM | POA: Diagnosis not present

## 2018-10-13 DIAGNOSIS — L97522 Non-pressure chronic ulcer of other part of left foot with fat layer exposed: Secondary | ICD-10-CM | POA: Diagnosis not present

## 2018-10-13 DIAGNOSIS — E0843 Diabetes mellitus due to underlying condition with diabetic autonomic (poly)neuropathy: Secondary | ICD-10-CM

## 2018-10-19 NOTE — Progress Notes (Signed)
Subjective: 52 year old female with PMHx of T2DM presenting today as a new patient with a chief complaint of an ulceration of the left hallux that has been present for the past six months. She reports associated sharp, shooting pain and states it feels like "lightening" going through the foot. Applying pressure to the wound increases the pain. She has been treated by another podiatrist in the past for her symptoms. Patient is here for further evaluation and treatment.  Past Medical History:  Diagnosis Date  . Acid reflux    takes Tums  . Anemia   . Arthritis   . Bipolar 1 disorder ([place])   . Blindness of left eye   . Cervical radiculopathy   . CHF (congestive heart failure) ([place])   . Chronic kidney disease    Stage 5- 01/25/17  . Constipation   . COPD (chronic obstructive pulmonary disease) ([place]) 2014   bronchitis  . Degenerative disc disease, thoracic   . Depression   . Diabetes mellitus    Type II  . Diabetic retinopathy ([place])   . Dyspnea    when walking  . End stage kidney disease ([place])    M, W, F [facility] [place]  . Hypertension   . Noncompliance with medication regimen   . Noncompliance with medication regimen   . Obesity (BMI 30-39.9)   . OSA (obstructive sleep apnea)    cpap  . Panic attack   . RLS (restless legs syndrome)      Objective/Physical Exam General: The patient is alert and oriented x3 in no acute distress.  Dermatology:  Wound #1 noted to the left hallux measuring 1.1 x 0.3 x 0.2 cm (LxWxD).   To the noted ulceration(s), there is no eschar. There is a moderate amount of slough, fibrin, and necrotic tissue noted. Granulation tissue and wound base is red. There is a minimal amount of serosanguineous drainage noted. There is no exposed bone muscle-tendon ligament or joint. There is no malodor. Periwound integrity is intact. Skin is warm, dry and supple bilateral lower extremities.  Vascular: Palpable pedal pulses bilaterally. No edema or erythema noted.  Capillary refill within normal limits.  Neurological: Epicritic and protective threshold diminished bilaterally.   Musculoskeletal Exam: Range of motion within normal limits to all pedal and ankle joints bilateral. Muscle strength 5/5 in all groups bilateral.   Assessment: #1 ulceration of the left hallux secondary to diabetes mellitus #2 diabetes mellitus w/ peripheral neuropathy   Plan of Care:  #1 Patient was evaluated. #2 medically necessary excisional debridement including subcutaneous tissue was performed using a tissue nipper and a chisel blade. Excisional debridement of all the necrotic nonviable tissue down to healthy bleeding viable tissue was performed with post-debridement measurements same as pre-. #3 the wound was cleansed and dry sterile dressing applied. #4 Recommended antibiotic ointment daily with a dry sterile dressing.  #5 Post op shoe dispensed.  #6 patient is to return to clinic in 3 weeks.   Edrick Kins, DPM [facility]  Dr. Edrick Kins, [address]                                        [place], [place] [address]                Office [phone]  Fax [phone]

## 2018-10-20 ENCOUNTER — Telehealth (HOSPITAL_COMMUNITY): Payer: Self-pay | Admitting: Rehabilitation

## 2018-10-20 NOTE — Telephone Encounter (Signed)
The above patient or their representative was contacted and gave the following answers to these questions:         Do you have any of the following symptoms?  Fever                    Cough                   Shortness of breath  Do  you have any of the following other symptoms?    muscle pain         vomiting,        diarrhea        rash         weakness        red eye        abdominal pain         bruising          bruising or bleeding              joint pain           severe headache    Have you been in contact with someone who was or has been sick in the past 2 weeks?  Yes                 Unsure                         Unable to assess   Does the person that you were in contact with have any of the following symptoms?   Cough         shortness of breath           muscle pain         vomiting,            diarrhea            rash            weakness           fever            red eye           abdominal pain           bruising  or  bleeding                joint pain                severe headache               Have you  or someone you have been in contact with traveled internationally in th last month?         If yes, which countries?   Have you  or someone you have been in contact with traveled outside [place] in th last month?         If yes, which state and city?   COMMENTS OR ACTION PLAN FOR THIS PATIENT:

## 2018-10-21 ENCOUNTER — Ambulatory Visit: Payer: Medicaid Other | Admitting: Vascular Surgery

## 2018-10-22 ENCOUNTER — Encounter (HOSPITAL_COMMUNITY): Payer: Self-pay

## 2018-10-22 ENCOUNTER — Emergency Department (HOSPITAL_COMMUNITY)
Admission: EM | Admit: 2018-10-22 | Discharge: 2018-10-22 | Disposition: A | Discharge status: Home / Self Care | Payer: Medicaid Other | Attending: Emergency Medicine | Admitting: Emergency Medicine

## 2018-10-22 ENCOUNTER — Other Ambulatory Visit: Payer: Self-pay

## 2018-10-22 ENCOUNTER — Emergency Department (HOSPITAL_COMMUNITY): Payer: Medicaid Other

## 2018-10-22 DIAGNOSIS — Z992 Dependence on renal dialysis: Secondary | ICD-10-CM | POA: Insufficient documentation

## 2018-10-22 DIAGNOSIS — Z794 Long term (current) use of insulin: Secondary | ICD-10-CM | POA: Insufficient documentation

## 2018-10-22 DIAGNOSIS — E119 Type 2 diabetes mellitus without complications: Secondary | ICD-10-CM | POA: Insufficient documentation

## 2018-10-22 DIAGNOSIS — L03116 Cellulitis of left lower limb: Secondary | ICD-10-CM | POA: Diagnosis not present

## 2018-10-22 DIAGNOSIS — M79605 Pain in left leg: Secondary | ICD-10-CM | POA: Diagnosis present

## 2018-10-22 DIAGNOSIS — Z79899 Other long term (current) drug therapy: Secondary | ICD-10-CM | POA: Diagnosis not present

## 2018-10-22 DIAGNOSIS — I509 Heart failure, unspecified: Secondary | ICD-10-CM | POA: Insufficient documentation

## 2018-10-22 DIAGNOSIS — J449 Chronic obstructive pulmonary disease, unspecified: Secondary | ICD-10-CM | POA: Diagnosis not present

## 2018-10-22 DIAGNOSIS — I132 Hypertensive heart and chronic kidney disease with heart failure and with stage 5 chronic kidney disease, or end stage renal disease: Secondary | ICD-10-CM | POA: Diagnosis not present

## 2018-10-22 DIAGNOSIS — Z87891 Personal history of nicotine dependence: Secondary | ICD-10-CM | POA: Insufficient documentation

## 2018-10-22 DIAGNOSIS — N186 End stage renal disease: Secondary | ICD-10-CM | POA: Diagnosis not present

## 2018-10-22 MED ORDER — DOXYCYCLINE HYCLATE 100 MG PO TABS
100.0000 mg | ORAL_TABLET | Freq: Once | ORAL | Status: AC
  Administered 2018-10-22: 100 mg via ORAL
  Filled 2018-10-22: qty 1

## 2018-10-22 MED ORDER — HYDROCODONE-ACETAMINOPHEN 5-325 MG PO TABS
2.0000 | ORAL_TABLET | Freq: Once | ORAL | Status: AC
  Administered 2018-10-22: 2 via ORAL
  Filled 2018-10-22: qty 2

## 2018-10-22 MED ORDER — DOXYCYCLINE HYCLATE 100 MG PO CAPS: 100.0000 mg | ORAL_CAPSULE | Freq: Two times a day (BID) | ORAL | 0 refills | Status: DC

## 2018-10-22 NOTE — ED Triage Notes (Signed)
Pt fell off commode Tuesday c/o pain in left lower leg.

## 2018-10-22 NOTE — Discharge Instructions (Addendum)
You were given your first dose of doxycycline today.  If you develop fever, vomiting, worsening pain, or any other new/concerning symptoms then return to the ER for evaluation.  Call dialysis after discharge to set up your dialysis session as you missed this morning.

## 2018-10-22 NOTE — ED Provider Notes (Signed)
[facility] EMERGENCY DEPARTMENT Provider Note   CSN: [id number] Arrival date & time: 10/22/18  8182    History   Chief Complaint Chief Complaint  Patient presents with  . Fall    HPI Martha [name] is a 53 y.o. female.     HPI  53 year old female presents with left leg pain.  3 days ago she fell off the toilet when she slipped.  Her leg hit the commode and bathtub.  She is been able to ambulate since and has had some pain.  However since yesterday she has felt warmth and increased pain to her leg.  She has not noticed any fevers.  She is blind in her left eye so she is not sure of any color change or how long it is been there.  She last took a hydrocodone around 3 AM but states it has not been helping for the pain.  Feels like her leg is swollen.  No chest pain or shortness of breath. Last went to dialysis 2 days ago, due again this morning but came here instead.  Past Medical History:  Diagnosis Date  . Acid reflux    takes Tums  . Anemia   . Arthritis   . Bipolar 1 disorder ([place])   . Blindness of left eye   . Cervical radiculopathy   . CHF (congestive heart failure) ([place])   . Chronic kidney disease    Stage 5- 01/25/17  . Constipation   . COPD (chronic obstructive pulmonary disease) ([place]) 2014   bronchitis  . Degenerative disc disease, thoracic   . Depression   . Diabetes mellitus    Type II  . Diabetic retinopathy ([place])   . Dyspnea    when walking  . End stage kidney disease ([place])    M, W, F [facility] [place]  . Hypertension   . Noncompliance with medication regimen   . Noncompliance with medication regimen   . Obesity (BMI 30-39.9)   . OSA (obstructive sleep apnea)    cpap  . Panic attack   . RLS (restless legs syndrome)     Patient Active Problem List   Diagnosis Date Noted  . Metabolic encephalopathy 99/37/1696  . SIRS (systemic inflammatory response syndrome) ([place]) 06/23/2018  . UTI (urinary tract infection) 02/26/2018  . Volume overload 02/25/2018   . 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 Dialysis patient ([place]) 12/13/2017  . ESRD (end stage renal disease) on dialysis ([place])   . Chronic diarrhea 10/08/2017  . Renal failure (ARF), acute on chronic (HCC) 04/22/2017  . Anasarca associated with disorder of kidney 04/20/2017  . Protein-calorie malnutrition, severe 11/29/2016  . HTN (hypertension), malignant 11/20/2016  . Bipolar 1 disorder ([place]) 11/20/2016  . Noncompliance with medication regimen 11/20/2016  . Panic attack 11/20/2016  . Uncontrolled type 2 diabetes mellitus with hyperglycemia, without long-term current use of insulin ([place]) 10/27/2016  . Sensory disturbance 10/27/2016  . Left hemiparesis ([place]) 10/27/2016  . Cholelithiasis 04/26/2014  . Hyponatremia 04/26/2014  . Gallstones 04/06/2014  . Diabetic ulcer of left great toe ([place]) 10/09/2013  . Diabetes mellitus, type 2 ([place]) 05/30/2013  . Hyperglycemia 05/30/2013  . Internal hemorrhoids with other complication 78/93/8101  . Umbilical hernia without mention of obstruction or gangrene 04/15/2013  . Anemia 04/15/2013  . DM 05/13/2010  . Hyperlipidemia 05/13/2010  . Morbid obesity ([place]) 05/13/2010  . Depression with anxiety 05/13/2010  . RESTLESS LEG SYNDROME 05/13/2010  . Essential hypertension 05/13/2010  . GERD 05/13/2010  .  RECTAL BLEEDING 05/13/2010  . Sleep apnea 05/13/2010  . Dysphagia, oropharyngeal phase 05/13/2010    Past Surgical History:  Procedure Laterality Date  . AV FISTULA PLACEMENT Right 01/27/2017   Procedure: ARTERIOVENOUS (AV) FISTULA CREATION-RIGHT ARM;  Surgeon: Elam Dutch, MD;  Location: [facility] OR;  Service: Vascular;  Laterality: Right;  . AV FISTULA PLACEMENT Left 08/03/2018   Procedure: LEFT ARTERIOVENOUS (AV) FISTULA CREATION;  Surgeon: Elam Dutch, MD;  Location: [place] [facility];  Service: Vascular;  Laterality: Left;  . BIOPSY N/A 05/17/2013   Procedure: BIOPSY;  Surgeon: [name], MD;  Location: [facility];  Service:  Endoscopy;  Laterality: N/A;  . CHOLECYSTECTOMY N/A 04/27/2014   Procedure: LAPAROSCOPIC CHOLECYSTECTOMY;  Surgeon: [name], MD;  Location: [facility];  Service: General;  Laterality: N/A;  . COLONOSCOPY  06/06/2010   RCV:ELFYBO bleeding secondary to internal hemorrhoids but incomplete evaluation secondary to poor right colon prep/small rectal and sigmoid colon polyps (hyperplastic). PROPOFOL  . COLONOSCOPY  May 2013   Dr. Gilliam/[facility]: 5 mm a descending colon polyp, hyperplastic. Adequate bowel prep.  . ENDOMETRIAL ABLATION    . 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 ENUCLEATION Left 02/04/2018   Procedure: ENUCLEATION WITH PLACEMENT OF IMPLANT LEFT EYE;  Surgeon: Clista Bernhardt, MD;  Location: [place];  Service: Ophthalmology;  Laterality: Left;  . ESOPHAGOGASTRODUODENOSCOPY  06/06/2010   FBP:ZWCHENI erythema and edema of body of stomach, with sessile polypoid lesions. bx benign. no h.pylori  . ESOPHAGOGASTRODUODENOSCOPY (EGD) WITH PROPOFOL N/A 05/17/2013   Dr. Oneida Alar: normal esophagus, moderate nodular gastritis, negative path, empiric Savary dilation  . EYE SURGERY  07/2017   sx for glaucoma  . FLEXIBLE SIGMOIDOSCOPY N/A 05/17/2013   Dr. Oneida Alar: moderate sized internal hemorrhoids  . HEMORRHOID BANDING N/A 05/17/2013   Procedure: HEMORRHOID BANDING;  Surgeon: [name], MD;  Location: [facility];  Service: Endoscopy;  Laterality: N/A;  2 bands placed  . INCISION AND DRAINAGE ABSCESS Left 10/11/2013   Procedure: INCISION AND DRAINAGE AND DEBRIDEMENT LEFT BREAST  ABSCESS;  Surgeon: [name], MD;  Location: [facility];  Service: General;  Laterality: Left;  . INSERTION OF DIALYSIS CATHETER N/A 06/08/2018   Procedure: INSERTION OF Right internal jugular TUNNELED  DIALYSIS CATHETER;  Surgeon: Angelia Mould, MD;  Location: [place];  Service: Vascular;  Laterality: N/A;  . IRRIGATION AND DEBRIDEMENT ABSCESS Right 06/01/2013    Procedure: INCISION AND DRAINAGE AND DEBRIDEMENT ABSCESS RIGHT BREAST;  Surgeon: [name], MD;  Location: [facility];  Service: General;  Laterality: Right;  . LEFT EYE REMOVED Left 01/2018   [facility] [address].  Marland Kitchen LIGATION OF ARTERIOVENOUS  FISTULA Right 06/08/2018   Procedure: LIGATION OF ARTERIOVENOUS  FISTULA RIGHT ARM;  Surgeon: Angelia Mould, MD;  Location: [place];  Service: Vascular;  Laterality: Right;  . SAVORY DILATION N/A 05/17/2013   Procedure: SAVORY DILATION;  Surgeon: [name], MD;  Location[facility];  Service: Endoscopy;  Laterality: N/A;  14/15/16  . SKIN FULL THICKNESS GRAFT Left 05/05/2018   Procedure: ABDOMINAL DERMIS FAT SKIN GRAFT FULL THICKNESS LEFT EYE;  Surgeon: Clista Bernhardt, MD;  Location: [place];  Service: Ophthalmology;  Laterality: Left;  . TUBAL LIGATION       OB History    Gravida  2   Para  2   Term  1   Preterm  1   AB  Living  2     SAB      TAB      Ectopic      Multiple      Live Births               Home Medications    Prior to Admission medications   Medication Sig Start Date End Date Taking? Authorizing Provider  albuterol (PROVENTIL) (2.5 MG/3ML) 0.083% nebulizer solution Take 2.5 mg by nebulization 4 (four) times daily.    [provider]  ALPRAZolam Duanne Moron) 1 MG tablet Take 0.5 tablets (0.5 mg total) by mouth 2 (two) times daily as needed for anxiety (nerves). 09/29/18   Johnson, Clanford L, MD  atorvastatin (LIPITOR) 20 MG tablet Take 20 mg by mouth daily.     [provider]  calcium carbonate (TUMS EX) 750 MG chewable tablet Chew 1-3 tablets by mouth See admin instructions. Patient takes 3 tablets with meals and 1 tablet with snacks three times a day    [provider]  colchicine 0.6 MG tablet Take 1 tablet (0.6 mg total) by mouth every Tuesday, Thursday, and Saturday at 6 PM. Take 1 tablet every other day 09/30/18   Murlean Iba, MD   dicyclomine (BENTYL) 10 MG capsule Take 1 capsule (10 mg total) by mouth 4 (four) times daily. 09/29/18   Johnson, Clanford L, MD  doxycycline (VIBRAMYCIN) 100 MG capsule Take 1 capsule (100 mg total) by mouth 2 (two) times daily. 10/22/18   Sherwood Gambler, MD  Insulin Glargine (LANTUS SOLOSTAR) 100 UNIT/ML Solostar Pen Inject 30 Units into the skin every morning. And pen needles 1/day 06/29/18   Renato Shin, MD  lidocaine-prilocaine (EMLA) cream Apply 1 application topically as needed (topical anesthesia for hemodialysis if Gebauers and Lidocaine injection are ineffective.). 04/25/17   Johnson, Clanford L, MD  losartan (COZAAR) 25 MG tablet Take 25 mg by mouth daily.    [provider]  omeprazole (PRILOSEC) 40 MG capsule Take 40 mg by mouth daily.    [provider]  PARoxetine (PAXIL) 20 MG tablet Take 1 daily. This is to prevent panic attacks Patient taking differently: Take 20-40 mg by mouth daily. This is to prevent panic attacks 10/28/16   Sinda Du, MD  torsemide (DEMADEX) 100 MG tablet Take 100 mg by mouth daily.     [provider]    Family History Family History  Adopted: Yes  Family history unknown: Yes    Social History Social History   Tobacco Use  . Smoking status: Former Smoker    Packs/day: 1.50    Years: 25.00    Pack years: 37.50    Types: Cigarettes    Last attempt to quit: 10/23/2007    Years since quitting: 11.0  . Smokeless tobacco: Never Used  Substance Use Topics  . Alcohol use: No  . Drug use: No     Allergies   Codeine and Tape   Review of Systems Review of Systems  Constitutional: Negative for fever.  Respiratory: Negative for shortness of breath.   Cardiovascular: Negative for chest pain.  Musculoskeletal: Positive for arthralgias and myalgias.  Skin: Positive for color change.  All other systems reviewed and are negative.    Physical Exam Updated Vital Signs BP (!) 143/76 (BP Location: Right Arm)   Pulse  70   Temp 98.3 F (36.8 C) (Oral)   Resp 20   Ht 5\' 8"  (1.727 m)   Wt (!) 158.8 kg  LMP 08/03/2008 (Exact Date)   SpO2 94%   BMI 53.22 kg/m   Physical Exam Vitals signs and nursing note reviewed.  Constitutional:      General: She is not in acute distress.    Appearance: She is well-developed. She is obese. She is not ill-appearing or diaphoretic.  HENT:     Head: Normocephalic and atraumatic.     Right Ear: External ear normal.     Left Ear: External ear normal.     Nose: Nose normal.  Eyes:     General:        Right eye: No discharge.        Left eye: No discharge.  Cardiovascular:     Rate and Rhythm: Normal rate and regular rhythm.     Pulses:          Dorsalis pedis pulses are 2+ on the left side.  Pulmonary:     Effort: Pulmonary effort is normal.  Abdominal:     Palpations: Abdomen is soft.     Tenderness: There is no abdominal tenderness.  Musculoskeletal:     Left knee: No tenderness found.     Left ankle: She exhibits normal range of motion and no swelling. No tenderness.     Left lower leg: She exhibits tenderness and swelling.       Legs:     Left foot: No tenderness.     Comments: Left lower leg appears swollen compared to the right.  There is diffuse tenderness throughout the right leg, including the calf.  The patient has irregular erythema as outlined above with warmth.  No drainage or fluctuance.  Skin:    General: Skin is warm and dry.  Neurological:     Mental Status: She is alert.  Psychiatric:        Mood and Affect: Mood is not anxious.      ED Treatments / Results  Labs (all labs ordered are listed, but only abnormal results are displayed) Labs Reviewed - No data to display  EKG None  Radiology Dg Tibia/fibula Left  Result Date: 10/22/2018 CLINICAL DATA:  Pain following fall EXAM: LEFT TIBIA AND FIBULA - 2 VIEW COMPARISON:  February 11, 2013 FINDINGS: Frontal and lateral views were obtained. There is evidence of an old fracture of the  proximal tibia with remodeling, stable in appearance. No evident acute fracture or dislocation. There is narrowing in the medial ankle joint region. There is also mild narrowing of the medial and lateral compartments of the knee. IMPRESSION: Areas of arthropathy in the knee and ankle joints, incompletely visualized. Evidence of old trauma involving the proximal tibia with remodeling, unchanged. No acute fracture or dislocation demonstrable. Electronically Signed   By: Lowella Grip III M.D.   On: 10/22/2018 08:49   US Venous Img Lower Unilateral Left  Result Date: 10/22/2018 CLINICAL DATA:  53 year old female with a history of left lower extremity injury EXAM: LEFT LOWER EXTREMITY VENOUS DOPPLER ULTRASOUND TECHNIQUE: Gray-scale sonography with graded compression, as well as color Doppler and duplex ultrasound were performed to evaluate the lower extremity deep venous systems from the level of the common femoral vein and including the common femoral, femoral, profunda femoral, popliteal and calf veins including the posterior tibial, peroneal and gastrocnemius veins when visible. The superficial great saphenous vein was also interrogated. Spectral Doppler was utilized to evaluate flow at rest and with distal augmentation maneuvers in the common femoral, femoral and popliteal veins. COMPARISON:  None. FINDINGS: Contralateral Common Femoral  Vein: Respiratory phasicity is normal and symmetric with the symptomatic side. No evidence of thrombus. Normal compressibility. Common Femoral Vein: No evidence of thrombus. Normal compressibility, respiratory phasicity and response to augmentation. Saphenofemoral Junction: No evidence of thrombus. Normal compressibility and flow on color Doppler imaging. Profunda Femoral Vein: No evidence of thrombus. Normal compressibility and flow on color Doppler imaging. Femoral Vein: No evidence of thrombus. Normal compressibility, respiratory phasicity and response to augmentation.  Popliteal Vein: No evidence of thrombus. Normal compressibility, respiratory phasicity and response to augmentation. Calf Veins: Limited visualization of the calf veins, with no thrombus identified of the visualized peroneal and posterior tibial veins. Superficial Great Saphenous Vein: No evidence of thrombus. Normal compressibility and flow on color Doppler imaging. Other Findings:  None. IMPRESSION: Sonographic survey of the left lower extremity negative for DVT Electronically Signed   By: Corrie Mckusick D.O.   On: 10/22/2018 09:48    Procedures Procedures (including critical care time)  Medications Ordered in ED Medications  doxycycline (VIBRA-TABS) tablet 100 mg (has no administration in time range)  HYDROcodone-acetaminophen (NORCO/VICODIN) 5-325 MG per tablet 2 tablet (2 tablets Oral Given 10/22/18 3976)     Initial Impression / Assessment and Plan / ED Course  I have reviewed the triage vital signs and the nursing notes.  Pertinent labs & imaging results that were available during my care of the patient were reviewed by me and considered in my medical decision making (see chart for details).        Given the swelling and trauma, DVT ultrasound obtained to rule out DVT.  No bony fracture.  She does not have overt or uncontrolled pain to be concern for necrotizing fasciitis.  Her vital signs are unremarkable overall and thus I have low suspicion for serious illness or bacteremia.  I do not think labs would be beneficial.  She has technically not really missed dialysis except for a couple hours and thus I think is reasonable to let her go to dialysis today.  I do not see any specific breaks in the skin but given the local trauma and the redness with warmth, will cover for infection with doxycycline.  Discussed return precautions.  Final Clinical Impressions(s) / ED Diagnoses   Final diagnoses:  Left leg cellulitis    ED Discharge Orders         Ordered    doxycycline (VIBRAMYCIN)  100 MG capsule  2 times daily     10/22/18 7341           Sherwood Gambler, MD 10/22/18 1003

## 2018-11-03 ENCOUNTER — Ambulatory Visit: Payer: Medicaid Other | Admitting: Podiatry

## 2018-11-11 ENCOUNTER — Ambulatory Visit (INDEPENDENT_AMBULATORY_CARE_PROVIDER_SITE_OTHER): Payer: Self-pay | Admitting: Vascular Surgery

## 2018-11-11 DIAGNOSIS — Z992 Dependence on renal dialysis: Secondary | ICD-10-CM

## 2018-11-11 DIAGNOSIS — N186 End stage renal disease: Secondary | ICD-10-CM

## 2018-11-11 NOTE — Progress Notes (Signed)
Virtual Visit via Telephone Note  I connected with Selinda Michaels on 11/11/18 at  1:15 PM EDT by telephone and verified that I am speaking with the correct person using two identifiers.    History of Present Illness: Patient is a 53 year old female with end-stage renal disease.  She previously had a right arm AV fistula placed and had this ligated for steal symptoms.  She currently has a functioning left basilic vein fistula and is awaiting second stage Superficialization of this.  However she is also having numbness and tingling in her left hand which could be consistent with steal.  She was referred to Dr. Harl Favor with hand surgery to see if there were any other options or other possible diagnoses that could improve her symptoms.  We were able to review the records from her visit with Dr. Burney Gauze on October 21, 2018 today.  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  She was sent for neurologic testing.  Those results are not currently available.  The patient states that currently she has severe pain in her hands while on dialysis.  She also has chronic numbness and tingling.  He does not have any ulceration or nonhealing wounds on her hands.   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  We will continue to delay superficial lysing her AV fistula until this has been completely evaluated by Dr. Burney Gauze.  If her symptoms can be improved with decompression of her nerves in her hands this would be much preferred over ligating her AV fistula.  If we ligate her AV fistula she will not really have any further permanent access options as I do not believe she would be a candidate for a thigh graft.  Patient is currently dialyzing by catheter.   Follow Up Instructions: We will arrange for her to see Dr. Burney Gauze as soon as possible so that a decision  can be made versus decompressive operation versus proceeding with fistula ligation.   I discussed the assessment and treatment plan with the patient. The patient was provided an opportunity to ask questions and all were answered. The patient agreed with the plan and demonstrated an understanding of the instructions.   The patient was advised to call back or seek an in-person evaluation if the symptoms worsen or if the condition fails to improve as anticipated.  I provided 15 minutes of non-face-to-face time during this encounter.   Ruta Hinds, MD    Patient is a 53 year old female

## 2018-11-17 ENCOUNTER — Other Ambulatory Visit: Payer: Self-pay

## 2018-11-17 ENCOUNTER — Emergency Department (HOSPITAL_COMMUNITY)
Admission: EM | Admit: 2018-11-17 | Discharge: 2018-11-17 | Disposition: A | Discharge status: Home / Self Care | Payer: Medicaid Other | Attending: Emergency Medicine | Admitting: Emergency Medicine

## 2018-11-17 ENCOUNTER — Encounter (HOSPITAL_COMMUNITY): Payer: Self-pay

## 2018-11-17 DIAGNOSIS — I509 Heart failure, unspecified: Secondary | ICD-10-CM | POA: Diagnosis not present

## 2018-11-17 DIAGNOSIS — G5603 Carpal tunnel syndrome, bilateral upper limbs: Secondary | ICD-10-CM

## 2018-11-17 DIAGNOSIS — F319 Bipolar disorder, unspecified: Secondary | ICD-10-CM | POA: Insufficient documentation

## 2018-11-17 DIAGNOSIS — Z9049 Acquired absence of other specified parts of digestive tract: Secondary | ICD-10-CM | POA: Diagnosis not present

## 2018-11-17 DIAGNOSIS — I132 Hypertensive heart and chronic kidney disease with heart failure and with stage 5 chronic kidney disease, or end stage renal disease: Secondary | ICD-10-CM | POA: Diagnosis not present

## 2018-11-17 DIAGNOSIS — M79641 Pain in right hand: Secondary | ICD-10-CM | POA: Diagnosis present

## 2018-11-17 DIAGNOSIS — Z794 Long term (current) use of insulin: Secondary | ICD-10-CM | POA: Diagnosis not present

## 2018-11-17 DIAGNOSIS — J449 Chronic obstructive pulmonary disease, unspecified: Secondary | ICD-10-CM | POA: Diagnosis not present

## 2018-11-17 DIAGNOSIS — E1122 Type 2 diabetes mellitus with diabetic chronic kidney disease: Secondary | ICD-10-CM | POA: Insufficient documentation

## 2018-11-17 DIAGNOSIS — Z992 Dependence on renal dialysis: Secondary | ICD-10-CM | POA: Insufficient documentation

## 2018-11-17 DIAGNOSIS — N186 End stage renal disease: Secondary | ICD-10-CM | POA: Diagnosis not present

## 2018-11-17 DIAGNOSIS — Z87891 Personal history of nicotine dependence: Secondary | ICD-10-CM | POA: Insufficient documentation

## 2018-11-17 DIAGNOSIS — Z79899 Other long term (current) drug therapy: Secondary | ICD-10-CM | POA: Insufficient documentation

## 2018-11-17 LAB — BASIC METABOLIC PANEL
Anion gap: 14 (ref 5–15)
BUN: 30 mg/dL — ABNORMAL HIGH (ref 6–20)
CO2: 23 mmol/L (ref 22–32)
Calcium: 9.1 mg/dL (ref 8.9–10.3)
Chloride: 101 mmol/L (ref 98–111)
Creatinine, Ser: 4.33 mg/dL — ABNORMAL HIGH (ref 0.44–1.00)
GFR calc Af Amer: 13 mL/min — ABNORMAL LOW (ref >60)
GFR calc non Af Amer: 11 mL/min — ABNORMAL LOW (ref >60)
Glucose, Bld: 329 mg/dL — ABNORMAL HIGH (ref 70–99)
Potassium: 3.3 mmol/L — ABNORMAL LOW (ref 3.5–5.1)
Sodium: 138 mmol/L (ref 135–145)

## 2018-11-17 MED ORDER — PREDNISONE 20 MG PO TABS: 40.0000 mg | ORAL_TABLET | Freq: Every day | ORAL | 0 refills | Status: DC

## 2018-11-17 NOTE — Discharge Instructions (Addendum)
Follow-up with Dr. Burney Gauze.  Watch your sugars because they can easily get out of control with the steroids.

## 2018-11-17 NOTE — ED Notes (Signed)
Pt is having tingling pain in her hands and legs after dialysis. Can't describe the pain.

## 2018-11-17 NOTE — ED Provider Notes (Signed)
[facility] Provider Note   CSN: [id number] Arrival date & time: 11/17/18  1002    History   Chief Complaint Chief Complaint  Patient presents with  . Hand Pain  . Leg Pain    HPI Martha [name] is a 53 y.o. female.     HPI Patient presents acute on chronic pain in her legs and hands with dialysis.  Has had an extensive work-up.  Pain was worse today.  States it may kill her.  States that in the hands it is carpal tunnel.  Dr. Burney Gauze wants to do surgery, however with the coronavirus there is no place for him to operate right now.  Also has cramping in the legs at times.  Patient states the dialysis people do not seem to care.  No fevers or chills.  States she has about an hour and 45 minutes left of dialysis today.  Was dialyzed on Monday with a complete run.  Sometimes the hands will get numb.  No chest pain or trouble breathing.  Dialysis is done through a right chest wall catheter. Past Medical History:  Diagnosis Date  . Acid reflux    takes Tums  . Anemia   . Arthritis   . Bipolar 1 disorder ([place])   . Blindness of left eye   . Cervical radiculopathy   . CHF (congestive heart failure) ([place])   . Chronic kidney disease    Stage 5- 01/25/17  . Constipation   . COPD (chronic obstructive pulmonary disease) ([place]) 2014   bronchitis  . Degenerative disc disease, thoracic   . Depression   . Diabetes mellitus    Type II  . Diabetic retinopathy ([place])   . Dyspnea    when walking  . End stage kidney disease ([place])    M, W, F [facility]  . Hypertension   . Noncompliance with medication regimen   . Noncompliance with medication regimen   . Obesity (BMI 30-39.9)   . OSA (obstructive sleep apnea)    cpap  . Panic attack   . RLS (restless legs syndrome)     Patient Active Problem List   Diagnosis Date Noted  . Metabolic encephalopathy 00/86/7619  . SIRS (systemic inflammatory response syndrome) ([place]) 06/23/2018  . UTI (urinary tract infection)  02/26/2018  . Volume overload 02/25/2018  . 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 Dialysis patient ([place]) 12/13/2017  . ESRD (end stage renal disease) on dialysis ([place])   . Chronic diarrhea 10/08/2017  . Renal failure (ARF), acute on chronic (HCC) 04/22/2017  . Anasarca associated with disorder of kidney 04/20/2017  . Protein-calorie malnutrition, severe 11/29/2016  . HTN (hypertension), malignant 11/20/2016  . Bipolar 1 disorder ([place]) 11/20/2016  . Noncompliance with medication regimen 11/20/2016  . Panic attack 11/20/2016  . Uncontrolled type 2 diabetes mellitus with hyperglycemia, without long-term current use of insulin ([place]) 10/27/2016  . Sensory disturbance 10/27/2016  . Left hemiparesis ([place]) 10/27/2016  . Cholelithiasis 04/26/2014  . Hyponatremia 04/26/2014  . Gallstones 04/06/2014  . Diabetic ulcer of left great toe ([place]) 10/09/2013  . Diabetes mellitus, type 2 ([place]) 05/30/2013  . Hyperglycemia 05/30/2013  . Internal hemorrhoids with other complication 50/93/2671  . Umbilical hernia without mention of obstruction or gangrene 04/15/2013  . Anemia 04/15/2013  . DM 05/13/2010  . Hyperlipidemia 05/13/2010  . Morbid obesity ([place]) 05/13/2010  . Depression with anxiety 05/13/2010  . RESTLESS LEG SYNDROME 05/13/2010  . Essential hypertension 05/13/2010  . GERD  05/13/2010  . RECTAL BLEEDING 05/13/2010  . Sleep apnea 05/13/2010  . Dysphagia, oropharyngeal phase 05/13/2010    Past Surgical History:  Procedure Laterality Date  . AV FISTULA PLACEMENT Right 01/27/2017   Procedure: ARTERIOVENOUS (AV) FISTULA CREATION-RIGHT ARM;  Surgeon: Elam Dutch, MD;  Location: [facility] OR;  Service: Vascular;  Laterality: Right;  . AV FISTULA PLACEMENT Left 08/03/2018   Procedure: LEFT ARTERIOVENOUS (AV) FISTULA CREATION;  Surgeon: Elam Dutch, MD;  Location: [facility];  Service: Vascular;  Laterality: Left;  . BIOPSY N/A 05/17/2013   Procedure: BIOPSY;  Surgeon: [name],  MD;  Location: [facility];  Service: Endoscopy;  Laterality: N/A;  . CHOLECYSTECTOMY N/A 04/27/2014   Procedure: LAPAROSCOPIC CHOLECYSTECTOMY;  Surgeon: [name], MD;  Location: [facility];  Service: General;  Laterality: N/A;  . COLONOSCOPY  06/06/2010   LKG:MWNUUV bleeding secondary to internal hemorrhoids but incomplete evaluation secondary to poor right colon prep/small rectal and sigmoid colon polyps (hyperplastic). PROPOFOL  . COLONOSCOPY  May 2013   Dr. Gilliam/[facility]: 5 mm a descending colon polyp, hyperplastic. Adequate bowel prep.  . ENDOMETRIAL ABLATION    . 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 ENUCLEATION Left 02/04/2018   Procedure: ENUCLEATION WITH PLACEMENT OF IMPLANT LEFT EYE;  Surgeon: Clista Bernhardt, MD;  Location: [facility];  Service: Ophthalmology;  Laterality: Left;  . ESOPHAGOGASTRODUODENOSCOPY  06/06/2010   OZD:GUYQIHK erythema and edema of body of stomach, with sessile polypoid lesions. bx benign. no h.pylori  . ESOPHAGOGASTRODUODENOSCOPY (EGD) WITH PROPOFOL N/A 05/17/2013   Dr. Oneida Alar: normal esophagus, moderate nodular gastritis, negative path, empiric Savary dilation  . EYE SURGERY  07/2017   sx for glaucoma  . FLEXIBLE SIGMOIDOSCOPY N/A 05/17/2013   Dr. Oneida Alar: moderate sized internal hemorrhoids  . HEMORRHOID BANDING N/A 05/17/2013   Procedure: HEMORRHOID BANDING;  Surgeon: [name], MD;  Location[facility];  Service: Endoscopy;  Laterality: N/A;  2 bands placed  . INCISION AND DRAINAGE ABSCESS Left 10/11/2013   Procedure: INCISION AND DRAINAGE AND DEBRIDEMENT LEFT BREAST  ABSCESS;  Surgeon: [name], MD;  Location: [facility];  Service: General;  Laterality: Left;  . INSERTION OF DIALYSIS CATHETER N/A 06/08/2018   Procedure: INSERTION OF Right internal jugular TUNNELED  DIALYSIS CATHETER;  Surgeon: Angelia Mould, MD;  Location: [facility];  Service: Vascular;  Laterality: N/A;  . IRRIGATION AND DEBRIDEMENT  ABSCESS Right 06/01/2013   Procedure: INCISION AND DRAINAGE AND DEBRIDEMENT ABSCESS RIGHT BREAST;  Surgeon: [name], MD;  Location[facility];  Service: General;  Laterality: Right;  . LEFT EYE REMOVED Left 01/2018   [facility] on [address].  Marland Kitchen LIGATION OF ARTERIOVENOUS  FISTULA Right 06/08/2018   Procedure: LIGATION OF ARTERIOVENOUS  FISTULA RIGHT ARM;  Surgeon: Angelia Mould, MD;  Location: [facility];  Service: Vascular;  Laterality: Right;  . SAVORY DILATION N/A 05/17/2013   Procedure: SAVORY DILATION;  Surgeon: [name], MD;  Location: [facility];  Service: Endoscopy;  Laterality: N/A;  14/15/16  . SKIN FULL THICKNESS GRAFT Left 05/05/2018   Procedure: ABDOMINAL DERMIS FAT SKIN GRAFT FULL THICKNESS LEFT EYE;  Surgeon: Clista Bernhardt, MD;  Location: [facility];  Service: Ophthalmology;  Laterality: Left;  . TUBAL LIGATION       OB History    Gravida  2   Para  2   Term  1   Preterm  1   AB  Living  2     SAB      TAB      Ectopic      Multiple      Live Births               Home Medications    Prior to Admission medications   Medication Sig Start Date End Date Taking? Authorizing Provider  albuterol (PROVENTIL) (2.5 MG/3ML) 0.083% nebulizer solution Take 2.5 mg by nebulization 4 (four) times daily.    [provider]  ALPRAZolam Duanne Moron) 1 MG tablet Take 0.5 tablets (0.5 mg total) by mouth 2 (two) times daily as needed for anxiety (nerves). 09/29/18   Johnson, Clanford L, MD  atorvastatin (LIPITOR) 20 MG tablet Take 20 mg by mouth daily.     [provider]  calcium carbonate (TUMS EX) 750 MG chewable tablet Chew 1-3 tablets by mouth See admin instructions. Patient takes 3 tablets with meals and 1 tablet with snacks three times a day    [provider]  colchicine 0.6 MG tablet Take 1 tablet (0.6 mg total) by mouth every Tuesday, Thursday, and Saturday at 6 PM. Take 1 tablet every other day 09/30/18   Murlean Iba, MD  dicyclomine (BENTYL) 10 MG capsule Take 1 capsule (10 mg total) by mouth 4 (four) times daily. 09/29/18   Johnson, Clanford L, MD  doxycycline (VIBRAMYCIN) 100 MG capsule Take 1 capsule (100 mg total) by mouth 2 (two) times daily. 10/22/18   Sherwood Gambler, MD  Insulin Glargine (LANTUS SOLOSTAR) 100 UNIT/ML Solostar Pen Inject 30 Units into the skin every morning. And pen needles 1/day 06/29/18   Renato Shin, MD  lidocaine-prilocaine (EMLA) cream Apply 1 application topically as needed (topical anesthesia for hemodialysis if Gebauers and Lidocaine injection are ineffective.). 04/25/17   Johnson, Clanford L, MD  losartan (COZAAR) 25 MG tablet Take 25 mg by mouth daily.    [provider]  omeprazole (PRILOSEC) 40 MG capsule Take 40 mg by mouth daily.    [provider]  PARoxetine (PAXIL) 20 MG tablet Take 1 daily. This is to prevent panic attacks Patient taking differently: Take 20-40 mg by mouth daily. This is to prevent panic attacks 10/28/16   Sinda Du, MD  predniSONE (DELTASONE) 20 MG tablet Take 2 tablets (40 mg total) by mouth daily. 11/17/18   Davonna Belling, MD  torsemide (DEMADEX) 100 MG tablet Take 100 mg by mouth daily.     [provider]    Family History Family History  Adopted: Yes  Family history unknown: Yes    Social History Social History   Tobacco Use  . Smoking status: Former Smoker    Packs/day: 1.50    Years: 25.00    Pack years: 37.50    Types: Cigarettes    Last attempt to quit: 10/23/2007    Years since quitting: 11.0  . Smokeless tobacco: Never Used  Substance Use Topics  . Alcohol use: No  . Drug use: No     Allergies   Codeine and Tape   Review of Systems Review of Systems  Constitutional: Negative for appetite change.  HENT: Negative for congestion.   Cardiovascular: Negative for chest pain.  Gastrointestinal: Negative for abdominal pain.  Musculoskeletal: Negative for back pain.  Skin:  Negative for rash.  Neurological: Positive for tremors and numbness.     Physical Exam Updated Vital Signs BP (!) 111/53 (BP Location: Right Arm)   Pulse 66   Temp  98.9 F (37.2 C) (Oral)   Ht 5\' 8"  (1.727 m)   Wt (!) 158.8 kg   LMP 08/03/2008 (Exact Date)   SpO2 98%   BMI 53.22 kg/m   Physical Exam Vitals signs and nursing note reviewed.  Eyes:     Comments: Patient appears blind in left eye and potentially could be enucleated.  Cardiovascular:     Rate and Rhythm: Regular rhythm.  Pulmonary:     Comments: Dialysis catheter right chest wall. Abdominal:     Tenderness: There is no abdominal tenderness.  Musculoskeletal:     Right lower leg: Edema present.     Left lower leg: Edema present.     Comments: Mild tenderness palpation left hand.  Left wrist brace in place.  Skin:    Capillary Refill: Capillary refill takes less than 2 seconds.  Neurological:     Mental Status: She is alert.     Comments: Paresthesias over all left hand.  Good capillary refill.  Sensation intact in right hand with capillary refill.      ED Treatments / Results  Labs (all labs ordered are listed, but only abnormal results are displayed) Labs Reviewed  BASIC METABOLIC PANEL - Abnormal; Notable for the following components:      Result Value   Potassium 3.3 (*)    Glucose, Bld 329 (*)    BUN 30 (*)    Creatinine, Ser 4.33 (*)    GFR calc non Af Amer 11 (*)    GFR calc Af Amer 13 (*)    All other components within normal limits    EKG None  Radiology No results found.  Procedures Procedures (including critical care time)  Medications Ordered in ED Medications - No data to display   Initial Impression / Assessment and Plan / ED Course  I have reviewed the triage vital signs and the nursing notes.  Pertinent labs & imaging results that were available during my care of the patient were reviewed by me and considered in my medical decision making (see chart for details).         Patient with pain in mostly her bilateral forearms.  Does have some lower extremity pains also.  Has been worked up for this.  Thought decided is a carpal tunnel.  Is seen Dr. Burney Gauze but cannot get scheduled to a surgery because ORs were not open right now.  Mild hypokalemia.  We will try a short course of steroids to see if this helps as we cannot do any definitive treatment this time.  Does have hyperglycemia and is diabetic.  On insulin at home.  Does notice could increase her risk of a severe hyperglycemia and will need closer monitoring.  Will discharge home.  Final Clinical Impressions(s) / ED Diagnoses   Final diagnoses:  Carpal tunnel syndrome, bilateral    ED Discharge Orders         Ordered    predniSONE (DELTASONE) 20 MG tablet  Daily     11/17/18 1200           Davonna Belling, MD 11/17/18 1204

## 2018-11-17 NOTE — ED Triage Notes (Addendum)
Pt c/o pain in "jerking" in both legs and left hand x 1 year.  Pt says she came today because the pain was so bad.  Reports pain was worse during her dialysis treatment this morning.

## 2018-11-18 ENCOUNTER — Other Ambulatory Visit: Payer: Self-pay | Admitting: Orthopedic Surgery

## 2018-11-19 DIAGNOSIS — R5383 Other fatigue: Secondary | ICD-10-CM | POA: Diagnosis present

## 2018-11-19 DIAGNOSIS — Z79899 Other long term (current) drug therapy: Secondary | ICD-10-CM | POA: Insufficient documentation

## 2018-11-19 DIAGNOSIS — N186 End stage renal disease: Secondary | ICD-10-CM | POA: Insufficient documentation

## 2018-11-19 DIAGNOSIS — J449 Chronic obstructive pulmonary disease, unspecified: Secondary | ICD-10-CM | POA: Diagnosis not present

## 2018-11-19 DIAGNOSIS — I132 Hypertensive heart and chronic kidney disease with heart failure and with stage 5 chronic kidney disease, or end stage renal disease: Secondary | ICD-10-CM | POA: Insufficient documentation

## 2018-11-19 DIAGNOSIS — Z794 Long term (current) use of insulin: Secondary | ICD-10-CM | POA: Diagnosis not present

## 2018-11-19 DIAGNOSIS — Z992 Dependence on renal dialysis: Secondary | ICD-10-CM | POA: Diagnosis not present

## 2018-11-19 DIAGNOSIS — E1122 Type 2 diabetes mellitus with diabetic chronic kidney disease: Secondary | ICD-10-CM | POA: Insufficient documentation

## 2018-11-19 DIAGNOSIS — Z87891 Personal history of nicotine dependence: Secondary | ICD-10-CM | POA: Diagnosis not present

## 2018-11-19 DIAGNOSIS — E1165 Type 2 diabetes mellitus with hyperglycemia: Secondary | ICD-10-CM | POA: Diagnosis not present

## 2018-11-19 DIAGNOSIS — I509 Heart failure, unspecified: Secondary | ICD-10-CM | POA: Insufficient documentation

## 2018-11-20 ENCOUNTER — Encounter (HOSPITAL_COMMUNITY): Payer: Self-pay | Admitting: Emergency Medicine

## 2018-11-20 ENCOUNTER — Emergency Department (HOSPITAL_COMMUNITY)
Admission: EM | Admit: 2018-11-20 | Discharge: 2018-11-20 | Disposition: A | Discharge status: Home / Self Care | Payer: Medicaid Other | Attending: Emergency Medicine | Admitting: Emergency Medicine

## 2018-11-20 ENCOUNTER — Other Ambulatory Visit: Payer: Self-pay

## 2018-11-20 DIAGNOSIS — R739 Hyperglycemia, unspecified: Secondary | ICD-10-CM

## 2018-11-20 LAB — BASIC METABOLIC PANEL
Anion gap: 16 — ABNORMAL HIGH (ref 5–15)
Anion gap: 14 (ref 5–15)
BUN: 45 mg/dL — ABNORMAL HIGH (ref 6–20)
BUN: 47 mg/dL — ABNORMAL HIGH (ref 6–20)
CO2: 22 mmol/L (ref 22–32)
CO2: 23 mmol/L (ref 22–32)
Calcium: 9.2 mg/dL (ref 8.9–10.3)
Calcium: 9.4 mg/dL (ref 8.9–10.3)
Chloride: 93 mmol/L — ABNORMAL LOW (ref 98–111)
Chloride: 99 mmol/L (ref 98–111)
Creatinine, Ser: 5.72 mg/dL — ABNORMAL HIGH (ref 0.44–1.00)
Creatinine, Ser: 5.78 mg/dL — ABNORMAL HIGH (ref 0.44–1.00)
GFR calc Af Amer: 9 mL/min — ABNORMAL LOW (ref >60)
GFR calc Af Amer: 9 mL/min — ABNORMAL LOW (ref >60)
GFR calc non Af Amer: 8 mL/min — ABNORMAL LOW (ref >60)
GFR calc non Af Amer: 8 mL/min — ABNORMAL LOW (ref >60)
Glucose, Bld: 609 mg/dL (ref 70–99)
Glucose, Bld: 323 mg/dL — ABNORMAL HIGH (ref 70–99)
Potassium: 3.9 mmol/L (ref 3.5–5.1)
Potassium: 3.7 mmol/L (ref 3.5–5.1)
Sodium: 131 mmol/L — ABNORMAL LOW (ref 135–145)
Sodium: 136 mmol/L (ref 135–145)

## 2018-11-20 LAB — CBC WITH DIFFERENTIAL/PLATELET
Abs Immature Granulocytes: 0.06 10*3/uL (ref 0.00–0.07)
Basophils Absolute: 0.1 10*3/uL (ref 0.0–0.1)
Basophils Relative: 1 %
Eosinophils Absolute: 0 10*3/uL (ref 0.0–0.5)
Eosinophils Relative: 1 %
HCT: 31.8 % — ABNORMAL LOW (ref 36.0–46.0)
Hemoglobin: 9.9 g/dL — ABNORMAL LOW (ref 12.0–15.0)
Immature Granulocytes: 1 %
Lymphocytes Relative: 29 %
Lymphs Abs: 2.4 10*3/uL (ref 0.7–4.0)
MCH: 28.2 pg (ref 26.0–34.0)
MCHC: 31.1 g/dL (ref 30.0–36.0)
MCV: 90.6 fL (ref 80.0–100.0)
Monocytes Absolute: 0.5 10*3/uL (ref 0.1–1.0)
Monocytes Relative: 6 %
Neutro Abs: 5.2 10*3/uL (ref 1.7–7.7)
Neutrophils Relative %: 62 %
Platelets: 158 10*3/uL (ref 150–400)
RBC: 3.51 MIL/uL — ABNORMAL LOW (ref 3.87–5.11)
RDW: 17.4 % — ABNORMAL HIGH (ref 11.5–15.5)
WBC: 8.2 10*3/uL (ref 4.0–10.5)
nRBC: 0 % (ref 0.0–0.2)

## 2018-11-20 LAB — URINALYSIS, ROUTINE W REFLEX MICROSCOPIC
Bilirubin Urine: NEGATIVE
Glucose, UA: >=500 mg/dL — AB
Ketones, ur: NEGATIVE mg/dL
Nitrite: NEGATIVE
Protein, ur: 100 mg/dL — AB
Specific Gravity, Urine: 1.018 (ref 1.005–1.030)
pH: 5 (ref 5.0–8.0)

## 2018-11-20 LAB — BLOOD GAS, VENOUS
Acid-base deficit: 1.8 mmol/L (ref 0.0–2.0)
Bicarbonate: 22.9 mmol/L (ref 20.0–28.0)
FIO2: 21
O2 Saturation: 97 %
Patient temperature: 37
pCO2, Ven: 39.8 mmHg — ABNORMAL LOW (ref 44.0–60.0)
pH, Ven: 7.373 (ref 7.250–7.430)
pO2, Ven: 97.5 mmHg — ABNORMAL HIGH (ref 32.0–45.0)

## 2018-11-20 LAB — CBG MONITORING, ED
Glucose-Capillary: 419 mg/dL — ABNORMAL HIGH (ref 70–99)
Glucose-Capillary: 482 mg/dL — ABNORMAL HIGH (ref 70–99)
Glucose-Capillary: 594 mg/dL (ref 70–99)

## 2018-11-20 LAB — LACTIC ACID, PLASMA
Lactic Acid, Venous: 2.7 mmol/L (ref 0.5–1.9)
Lactic Acid, Venous: 3.7 mmol/L (ref 0.5–1.9)
Lactic Acid, Venous: 1.5 mmol/L (ref 0.5–1.9)

## 2018-11-20 MED ORDER — SODIUM CHLORIDE 0.9 % IV BOLUS
500.0000 mL | Freq: Once | INTRAVENOUS | Status: AC
  Administered 2018-11-20: 500 mL via INTRAVENOUS

## 2018-11-20 MED ORDER — INSULIN ASPART 100 UNIT/ML ~~LOC~~ SOLN
10.0000 [IU] | Freq: Once | SUBCUTANEOUS | Status: AC
  Administered 2018-11-20: 10 [IU] via SUBCUTANEOUS
  Filled 2018-11-20: qty 1

## 2018-11-20 MED ORDER — INSULIN ASPART 100 UNIT/ML ~~LOC~~ SOLN
8.0000 [IU] | Freq: Once | SUBCUTANEOUS | Status: AC
  Administered 2018-11-20: 02:00:00 8 [IU] via SUBCUTANEOUS
  Filled 2018-11-20: qty 1

## 2018-11-20 MED ORDER — HYDROCODONE-ACETAMINOPHEN 5-325 MG PO TABS
2.0000 | ORAL_TABLET | Freq: Once | ORAL | Status: AC
  Administered 2018-11-20: 2 via ORAL
  Filled 2018-11-20: qty 2

## 2018-11-20 NOTE — ED Notes (Addendum)
Date and time results received: 11/20/18 0111   Test: Lactic Critical Value: 2.7  Name of Provider Notified: Rancour, MD

## 2018-11-20 NOTE — ED Notes (Signed)
Pt states she doesn't want the IV fluids due to her being a dialysis pt. EDP notified.

## 2018-11-20 NOTE — Discharge Instructions (Addendum)
Stop taking the prednisone as it is likely causing your blood sugars to be elevated.  Keep a record of your blood sugar every day and take your insulin as prescribed.  Follow-up with your doctor.  Return to the ED with new or worsening symptoms.

## 2018-11-20 NOTE — ED Provider Notes (Signed)
[facility] EMERGENCY DEPARTMENT Provider Note   CSN: [id number] Arrival date & time: 11/19/18  2359    History   Chief Complaint Chief Complaint  Patient presents with  . Hyperglycemia    HPI Martha [name] is a 53 y.o. female.     53 y.o. female with medical history significant for ESRD, HTN, OSA, restless leg syndrome, depression, bipolar disorder, COPD presenting with elevated blood sugar.  States she checked her blood sugar on 8 PM as she normally does and it read "high".  She believes she may have drank some tea that contain sugar but is not sure.  Denies any other changes with her diet.  She states she did notice some blurry vision and thirst since then as well.  She is a diabetic and states compliance with her insulin regimen which is 50 units of insulin in the morning only.  She does not take any sliding scale or other diabetic medications.  She did go to dialysis today as she normally does.  Denies any recent fevers, chills, nausea, vomiting.  No abdominal pain or chest pain.  No trouble breathing.  She does still make some urine.  No recent infectious symptoms. Notably she was started on prednisone for carpal tunnel syndrome 3 days ago.  The history is provided by the patient.  Hyperglycemia  Associated symptoms: fatigue, increased thirst and polyuria   Associated symptoms: no abdominal pain, no chest pain, no dizziness, no dysuria, no fever, no nausea, no shortness of breath and no vomiting     Past Medical History:  Diagnosis Date  . Acid reflux    takes Tums  . Anemia   . Arthritis   . Bipolar 1 disorder ([place])   . Blindness of left eye   . Cervical radiculopathy   . CHF (congestive heart failure) ([place])   . Chronic kidney disease    Stage 5- 01/25/17  . Constipation   . COPD (chronic obstructive pulmonary disease) ([place]) 2014   bronchitis  . Degenerative disc disease, thoracic   . Depression   . Diabetes mellitus    Type II  . Diabetic retinopathy ([place])   .  Dyspnea    when walking  . End stage kidney disease ([place])    M, W, F [facility]  . Hypertension   . Noncompliance with medication regimen   . Noncompliance with medication regimen   . Obesity (BMI 30-39.9)   . OSA (obstructive sleep apnea)    cpap  . Panic attack   . RLS (restless legs syndrome)     Patient Active Problem List   Diagnosis Date Noted  . Metabolic encephalopathy 70/35/0093  . SIRS (systemic inflammatory response syndrome) ([place]) 06/23/2018  . UTI (urinary tract infection) 02/26/2018  . Volume overload 02/25/2018  . 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 Dialysis patient ([place]) 12/13/2017  . ESRD (end stage renal disease) on dialysis ([place])   . Chronic diarrhea 10/08/2017  . Renal failure (ARF), acute on chronic (HCC) 04/22/2017  . Anasarca associated with disorder of kidney 04/20/2017  . Protein-calorie malnutrition, severe 11/29/2016  . HTN (hypertension), malignant 11/20/2016  . Bipolar 1 disorder ([place]) 11/20/2016  . Noncompliance with medication regimen 11/20/2016  . Panic attack 11/20/2016  . Uncontrolled type 2 diabetes mellitus with hyperglycemia, without long-term current use of insulin ([place]) 10/27/2016  . Sensory disturbance 10/27/2016  . Left hemiparesis ([place]) 10/27/2016  . Cholelithiasis 04/26/2014  . Hyponatremia 04/26/2014  . Gallstones 04/06/2014  .  Diabetic ulcer of left great toe ([place]) 10/09/2013  . Diabetes mellitus, type 2 ([place]) 05/30/2013  . Hyperglycemia 05/30/2013  . Internal hemorrhoids with other complication 63/14/9702  . Umbilical hernia without mention of obstruction or gangrene 04/15/2013  . Anemia 04/15/2013  . DM 05/13/2010  . Hyperlipidemia 05/13/2010  . Morbid obesity ([place]) 05/13/2010  . Depression with anxiety 05/13/2010  . RESTLESS LEG SYNDROME 05/13/2010  . Essential hypertension 05/13/2010  . GERD 05/13/2010  . RECTAL BLEEDING 05/13/2010  . Sleep apnea 05/13/2010  . Dysphagia, oropharyngeal phase  05/13/2010    Past Surgical History:  Procedure Laterality Date  . AV FISTULA PLACEMENT Right 01/27/2017   Procedure: ARTERIOVENOUS (AV) FISTULA CREATION-RIGHT ARM;  Surgeon: Elam Dutch, MD;  Location: [facility] OR;  Service: Vascular;  Laterality: Right;  . AV FISTULA PLACEMENT Left 08/03/2018   Procedure: LEFT ARTERIOVENOUS (AV) FISTULA CREATION;  Surgeon: Elam Dutch, MD;  Location: [place];  Service: Vascular;  Laterality: Left;  . BIOPSY N/A 05/17/2013   Procedure: BIOPSY;  Surgeon: [name], MD;  Location: [facility];  Service: Endoscopy;  Laterality: N/A;  . CHOLECYSTECTOMY N/A 04/27/2014   Procedure: LAPAROSCOPIC CHOLECYSTECTOMY;  Surgeon: [name], MD;  Location: [facility];  Service: General;  Laterality: N/A;  . COLONOSCOPY  06/06/2010   OVZ:CHYIFO bleeding secondary to internal hemorrhoids but incomplete evaluation secondary to poor right colon prep/small rectal and sigmoid colon polyps (hyperplastic). PROPOFOL  . COLONOSCOPY  May 2013   Dr. Gilliam/[facility]: 5 mm a descending colon polyp, hyperplastic. Adequate bowel prep.  . ENDOMETRIAL ABLATION    . 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 ENUCLEATION Left 02/04/2018   Procedure: ENUCLEATION WITH PLACEMENT OF IMPLANT LEFT EYE;  Surgeon: Clista Bernhardt, MD;  Location: [place];  Service: Ophthalmology;  Laterality: Left;  . ESOPHAGOGASTRODUODENOSCOPY  06/06/2010   YDX:AJOINOM erythema and edema of body of stomach, with sessile polypoid lesions. bx benign. no h.pylori  . ESOPHAGOGASTRODUODENOSCOPY (EGD) WITH PROPOFOL N/A 05/17/2013   Dr. [place] Alar: normal esophagus, moderate nodular gastritis, negative path, empiric Savary dilation  . EYE SURGERY  07/2017   sx for glaucoma  . FLEXIBLE SIGMOIDOSCOPY N/A 05/17/2013   Dr. Oneida Alar: moderate sized internal hemorrhoids  . HEMORRHOID BANDING N/A 05/17/2013   Procedure: HEMORRHOID BANDING;  Surgeon: [name], MD;  Location: [facility];   Service: Endoscopy;  Laterality: N/A;  2 bands placed  . INCISION AND DRAINAGE ABSCESS Left 10/11/2013   Procedure: INCISION AND DRAINAGE AND DEBRIDEMENT LEFT BREAST  ABSCESS;  Surgeon: [name], MD;  Location: [facility];  Service: General;  Laterality: Left;  . INSERTION OF DIALYSIS CATHETER N/A 06/08/2018   Procedure: INSERTION OF Right internal jugular TUNNELED  DIALYSIS CATHETER;  Surgeon: Angelia Mould, MD;  Location: [place];  Service: Vascular;  Laterality: N/A;  . IRRIGATION AND DEBRIDEMENT ABSCESS Right 06/01/2013   Procedure: INCISION AND DRAINAGE AND DEBRIDEMENT ABSCESS RIGHT BREAST;  Surgeon: [name], MD;  Location: [facility];  Service: General;  Laterality: Right;  . LEFT EYE REMOVED Left 01/2018   [facility] [address].  Marland Kitchen LIGATION OF ARTERIOVENOUS  FISTULA Right 06/08/2018   Procedure: LIGATION OF ARTERIOVENOUS  FISTULA RIGHT ARM;  Surgeon: Angelia Mould, MD;  Location: [place];  Service: Vascular;  Laterality: Right;  . SAVORY DILATION N/A 05/17/2013   Procedure: SAVORY DILATION;  Surgeon: [name], MD;  Location: [facility];  Service: Endoscopy;  Laterality: N/A;  14/15/16  . SKIN FULL THICKNESS GRAFT Left 05/05/2018   Procedure: ABDOMINAL DERMIS FAT SKIN GRAFT FULL THICKNESS LEFT EYE;  Surgeon: Clista Bernhardt, MD;  Location: [place];  Service: Ophthalmology;  Laterality: Left;  . TUBAL LIGATION       OB History    Gravida  2   Para  2   Term  1   Preterm  1   AB      Living  2     SAB      TAB      Ectopic      Multiple      Live Births               Home Medications    Prior to Admission medications   Medication Sig Start Date End Date Taking? Authorizing Provider  albuterol (PROVENTIL) (2.5 MG/3ML) 0.083% nebulizer solution Take 2.5 mg by nebulization 4 (four) times daily.    [provider]  ALPRAZolam Duanne Moron) 1 MG tablet Take 0.5 tablets (0.5 mg total) by mouth 2 (two) times daily as  needed for anxiety (nerves). 09/29/18   Johnson, Clanford L, MD  atorvastatin (LIPITOR) 20 MG tablet Take 20 mg by mouth daily.     [provider]  calcium carbonate (TUMS EX) 750 MG chewable tablet Chew 1-3 tablets by mouth See admin instructions. Patient takes 3 tablets with meals and 1 tablet with snacks three times a day    [provider]  colchicine 0.6 MG tablet Take 1 tablet (0.6 mg total) by mouth every Tuesday, Thursday, and Saturday at 6 PM. Take 1 tablet every other day 09/30/18   Murlean Iba, MD  dicyclomine (BENTYL) 10 MG capsule Take 1 capsule (10 mg total) by mouth 4 (four) times daily. 09/29/18   Johnson, Clanford L, MD  doxycycline (VIBRAMYCIN) 100 MG capsule Take 1 capsule (100 mg total) by mouth 2 (two) times daily. 10/22/18   Sherwood Gambler, MD  Insulin Glargine (LANTUS SOLOSTAR) 100 UNIT/ML Solostar Pen Inject 30 Units into the skin every morning. And pen needles 1/day 06/29/18   Renato Shin, MD  lidocaine-prilocaine (EMLA) cream Apply 1 application topically as needed (topical anesthesia for hemodialysis if Gebauers and Lidocaine injection are ineffective.). 04/25/17   Johnson, Clanford L, MD  losartan (COZAAR) 25 MG tablet Take 25 mg by mouth daily.    [provider]  omeprazole (PRILOSEC) 40 MG capsule Take 40 mg by mouth daily.    [provider]  PARoxetine (PAXIL) 20 MG tablet Take 1 daily. This is to prevent panic attacks Patient taking differently: Take 20-40 mg by mouth daily. This is to prevent panic attacks 10/28/16   Sinda Du, MD  predniSONE (DELTASONE) 20 MG tablet Take 2 tablets (40 mg total) by mouth daily. 11/17/18   Davonna Belling, MD  torsemide (DEMADEX) 100 MG tablet Take 100 mg by mouth daily.     [provider]    Family History Family History  Adopted: Yes  Family history unknown: Yes    Social History Social History   Tobacco Use  . Smoking status: Former Smoker    Packs/day: 1.50     Years: 25.00    Pack years: 37.50    Types: Cigarettes    Last attempt to quit: 10/23/2007    Years since quitting: 11.0  . Smokeless tobacco: Never Used  Substance Use Topics  . Alcohol use: No  . Drug use: No  Allergies   Codeine and Tape   Review of Systems Review of Systems  Constitutional: Positive for fatigue. Negative for activity change, appetite change and fever.  HENT: Negative for congestion.   Respiratory: Negative for cough, chest tightness and shortness of breath.   Cardiovascular: Negative for chest pain.  Gastrointestinal: Negative for abdominal pain, nausea and vomiting.  Endocrine: Positive for polydipsia and polyuria.  Genitourinary: Negative for dysuria and hematuria.  Musculoskeletal: Positive for arthralgias and myalgias. Negative for back pain.  Neurological: Negative for dizziness and headaches.    all other systems are negative except as noted in the HPI and PMH.    Physical Exam Updated Vital Signs BP (!) 156/100 (BP Location: Right Arm)   Pulse 69   Temp (!) 97.5 F (36.4 C) (Oral)   Resp (!) 22   Ht 5\' 8"  (1.727 m)   Wt (!) 158.7 kg   LMP 08/03/2008 (Exact Date)   SpO2 95%   BMI 53.20 kg/m   Physical Exam Vitals signs and nursing note reviewed.  Constitutional:      General: She is not in acute distress.    Appearance: She is well-developed. She is obese.  HENT:     Head: Normocephalic and atraumatic.     Mouth/Throat:     Pharynx: No oropharyngeal exudate.  Eyes:     Conjunctiva/sclera: Conjunctivae normal.     Pupils: Pupils are equal, round, and reactive to light.     Comments: Postsurgical changes left eye  Neck:     Musculoskeletal: Normal range of motion and neck supple.     Comments: No meningismus. Cardiovascular:     Rate and Rhythm: Normal rate and regular rhythm.     Heart sounds: Normal heart sounds. No murmur.     Comments: Dialysis catheter right chest Pulmonary:     Effort: Pulmonary effort is normal. No  respiratory distress.     Breath sounds: Normal breath sounds.  Abdominal:     Palpations: Abdomen is soft.     Tenderness: There is no abdominal tenderness. There is no guarding or rebound.  Musculoskeletal: Normal range of motion.        General: No tenderness.  Skin:    General: Skin is warm.     Capillary Refill: Capillary refill takes less than 2 seconds.  Neurological:     General: No focal deficit present.     Mental Status: She is alert and oriented to person, place, and time. Mental status is at baseline.     Cranial Nerves: No cranial nerve deficit.     Motor: No abnormal muscle tone.     Coordination: Coordination normal.     Comments: No ataxia on finger to nose bilaterally. No pronator drift. 5/5 strength throughout. CN 2-12 intact.Equal grip strength. Sensation intact.   Psychiatric:        Behavior: Behavior normal.      ED Treatments / Results  Labs (all labs ordered are listed, but only abnormal results are displayed) Labs Reviewed  CBC WITH DIFFERENTIAL/PLATELET - Abnormal; Notable for the following components:      Result Value   RBC 3.51 (*)    Hemoglobin 9.9 (*)    HCT 31.8 (*)    RDW 17.4 (*)    All other components within normal limits  BASIC METABOLIC PANEL - Abnormal; Notable for the following components:   Sodium 131 (*)    Chloride 93 (*)    Glucose, Bld 609 (*)  BUN 45 (*)    Creatinine, Ser 5.72 (*)    GFR calc non Af Amer 8 (*)    GFR calc Af Amer 9 (*)    Anion gap 16 (*)    All other components within normal limits  URINALYSIS, ROUTINE W REFLEX MICROSCOPIC - Abnormal; Notable for the following components:   APPearance TURBID (*)    Glucose, UA >=500 (*)    Hgb urine dipstick SMALL (*)    Protein, ur 100 (*)    Leukocytes,Ua TRACE (*)    Bacteria, UA RARE (*)    All other components within normal limits  LACTIC ACID, PLASMA - Abnormal; Notable for the following components:   Lactic Acid, Venous 2.7 (*)    All other components  within normal limits  LACTIC ACID, PLASMA - Abnormal; Notable for the following components:   Lactic Acid, Venous 3.7 (*)    All other components within normal limits  BLOOD GAS, VENOUS - Abnormal; Notable for the following components:   pCO2, Ven 39.8 (*)    pO2, Ven 97.5 (*)    All other components within normal limits  BASIC METABOLIC PANEL - Abnormal; Notable for the following components:   Glucose, Bld 323 (*)    BUN 47 (*)    Creatinine, Ser 5.78 (*)    GFR calc non Af Amer 8 (*)    GFR calc Af Amer 9 (*)    All other components within normal limits  CBG MONITORING, ED - Abnormal; Notable for the following components:   Glucose-Capillary 594 (*)    All other components within normal limits  CBG MONITORING, ED - Abnormal; Notable for the following components:   Glucose-Capillary 482 (*)    All other components within normal limits  CBG MONITORING, ED - Abnormal; Notable for the following components:   Glucose-Capillary 419 (*)    All other components within normal limits  LACTIC ACID, PLASMA  LACTIC ACID, PLASMA    EKG None  Radiology No results found.  Procedures Procedures (including critical care time)  Medications Ordered in ED Medications  insulin aspart (novoLOG) injection 10 Units (10 Units Subcutaneous Given 11/20/18 0043)     Initial Impression / Assessment and Plan / ED Course  I have reviewed the triage vital signs and the nursing notes.  Pertinent labs & imaging results that were available during my care of the patient were reviewed by me and considered in my medical decision making (see chart for details).       Hyperglycemia in known diabetic with recent prednisone use.  No infectious symptoms. No fever, CP, SOB.  Labs show hyperglycemia 609, anion gap 16, bicarb 22. Patient does not appear to be in DKA.  She will be given gentle fluids and insulin.  Lactate was found to be elevated 2.7 and patient was given gentle IV fluids.  She was dialyzed  today.  Repeat lactate has jumped to 3.7.  Patient appears well and is afebrile.  Doubt sepsis.  Suspect this is due to her hyperglycemia.  No cough, urinary symptoms, vomiting, runny nose, sore throat, skin lesions, or rashes. She is given additional IV fluids and insulin.  Lactate has normalized to 1.5.  Sugar down to 323 with normal anion gap. UA without ketones.  No evidence of DKA. Continue her home Lantus.  She does not take any short acting insulin at home per her endocrinologist.  Advised to stop prednisone as that is likely the source of her hyperglycemia. Keep  a record of blood sugars at home and follow-up with PCP.  Return precautions discussed.     Final Clinical Impressions(s) / ED Diagnoses   Final diagnoses:  Hyperglycemia    ED Discharge Orders    None       Theresa Dohrman, Annie Main, MD 11/20/18 [id number]

## 2018-11-20 NOTE — ED Triage Notes (Signed)
Pt c/o high CBG, meter reading "high", pt states she has had blurry vision, dry mouth, thrist and has been staggering since 2000 tonight

## 2018-11-20 NOTE — ED Notes (Signed)
Date and time results received: 11/20/18 0327 (use smartphrase ".now" to insert current time)  Test: Lactic Acid Critical Value: 3.7  Name of Provider Notified: EDP Rancour  Orders Received? Or Actions Taken?: Acknowledged.

## 2018-11-20 NOTE — ED Notes (Signed)
Date and time results received: 11/20/18 0111  Test: Glucose Critical Value: 609  Name of Provider Notified: Rancour, MD

## 2018-11-22 ENCOUNTER — Emergency Department (HOSPITAL_COMMUNITY)
Admission: EM | Admit: 2018-11-22 | Discharge: 2018-11-22 | Disposition: A | Discharge status: Home / Self Care | Payer: Medicaid Other | Attending: Emergency Medicine | Admitting: Emergency Medicine

## 2018-11-22 ENCOUNTER — Encounter (HOSPITAL_COMMUNITY): Payer: Self-pay | Admitting: Emergency Medicine

## 2018-11-22 ENCOUNTER — Other Ambulatory Visit: Payer: Self-pay

## 2018-11-22 DIAGNOSIS — I110 Hypertensive heart disease with heart failure: Secondary | ICD-10-CM | POA: Diagnosis not present

## 2018-11-22 DIAGNOSIS — I509 Heart failure, unspecified: Secondary | ICD-10-CM | POA: Diagnosis not present

## 2018-11-22 DIAGNOSIS — G5602 Carpal tunnel syndrome, left upper limb: Secondary | ICD-10-CM | POA: Diagnosis not present

## 2018-11-22 DIAGNOSIS — M25532 Pain in left wrist: Secondary | ICD-10-CM | POA: Diagnosis present

## 2018-11-22 DIAGNOSIS — G8929 Other chronic pain: Secondary | ICD-10-CM | POA: Insufficient documentation

## 2018-11-22 DIAGNOSIS — N185 Chronic kidney disease, stage 5: Secondary | ICD-10-CM | POA: Insufficient documentation

## 2018-11-22 DIAGNOSIS — J449 Chronic obstructive pulmonary disease, unspecified: Secondary | ICD-10-CM | POA: Diagnosis not present

## 2018-11-22 DIAGNOSIS — Z992 Dependence on renal dialysis: Secondary | ICD-10-CM | POA: Diagnosis not present

## 2018-11-22 MED ORDER — HYDROMORPHONE HCL 1 MG/ML IJ SOLN
1.0000 mg | Freq: Once | INTRAMUSCULAR | Status: AC
  Administered 2018-11-22: 11:00:00 1 mg via INTRAMUSCULAR
  Filled 2018-11-22: qty 1

## 2018-11-22 MED ORDER — ONDANSETRON 4 MG PO TBDP
4.0000 mg | ORAL_TABLET | Freq: Once | ORAL | Status: AC
  Administered 2018-11-22: 4 mg via ORAL
  Filled 2018-11-22: qty 1

## 2018-11-22 NOTE — ED Triage Notes (Signed)
Pt states her left wrist has been hurting for quite some time and this morning she was unable to sit at dialysis due to pain.  Scheduled for surgery on wrist on Thursday and took hydrocodone this morning.

## 2018-11-22 NOTE — ED Provider Notes (Signed)
[facility] EMERGENCY DEPARTMENT Provider Note   CSN: [id number] Arrival date & time: 11/22/18  1005    History   Chief Complaint Chief Complaint  Patient presents with  . Hand Pain    HPI Martha [name] is a 53 y.o. female with a history significant for left carpal tunnel syndrome anticipating surgical release in 3 days with Dr. Burney Gauze, presenting with increased pain in the wrist.  She denies any injuries, swelling, redness to the hand or wrist.  Pain is a deep constant ache that is worsened with movement and certain positions. She endorses numbness in the fingertips which is constant.  She was at dialysis this am and by the end of the 3rd hour her pain was severe.  She states her pain always gets worse with dialysis. She takes hydrocodone 10 mg qid for pain.      The history is provided by the patient.    Past Medical History:  Diagnosis Date  . Acid reflux    takes Tums  . Anemia   . Arthritis   . Bipolar 1 disorder ([place])   . Blindness of left eye   . Cervical radiculopathy   . CHF (congestive heart failure) ([place])   . Chronic kidney disease    Stage 5- 01/25/17  . Constipation   . COPD (chronic obstructive pulmonary disease) ([place]) 2014   bronchitis  . Degenerative disc disease, thoracic   . Depression   . Diabetes mellitus    Type II  . Diabetic retinopathy ([place])   . Dyspnea    when walking  . End stage kidney disease ([place])    M, W, F [facility]  . Hypertension   . Noncompliance with medication regimen   . Noncompliance with medication regimen   . Obesity (BMI 30-39.9)   . OSA (obstructive sleep apnea)    cpap  . Panic attack   . RLS (restless legs syndrome)     Patient Active Problem List   Diagnosis Date Noted  . Metabolic encephalopathy 05/24/2535  . SIRS (systemic inflammatory response syndrome) ([place]) 06/23/2018  . UTI (urinary tract infection) 02/26/2018  . Volume overload 02/25/2018  . 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  Dialysis patient ([place]) 12/13/2017  . ESRD (end stage renal disease) on dialysis ([place])   . Chronic diarrhea 10/08/2017  . Renal failure (ARF), acute on chronic (HCC) 04/22/2017  . Anasarca associated with disorder of kidney 04/20/2017  . Protein-calorie malnutrition, severe 11/29/2016  . HTN (hypertension), malignant 11/20/2016  . Bipolar 1 disorder ([place]) 11/20/2016  . Noncompliance with medication regimen 11/20/2016  . Panic attack 11/20/2016  . Uncontrolled type 2 diabetes mellitus with hyperglycemia, without long-term current use of insulin ([place]) 10/27/2016  . Sensory disturbance 10/27/2016  . Left hemiparesis ([place]) 10/27/2016  . Cholelithiasis 04/26/2014  . Hyponatremia 04/26/2014  . Gallstones 04/06/2014  . Diabetic ulcer of left great toe ([place]) 10/09/2013  . Diabetes mellitus, type 2 ([place]) 05/30/2013  . Hyperglycemia 05/30/2013  . Internal hemorrhoids with other complication 64/40/3474  . Umbilical hernia without mention of obstruction or gangrene 04/15/2013  . Anemia 04/15/2013  . DM 05/13/2010  . Hyperlipidemia 05/13/2010  . Morbid obesity ([place]) 05/13/2010  . Depression with anxiety 05/13/2010  . RESTLESS LEG SYNDROME 05/13/2010  . Essential hypertension 05/13/2010  . GERD 05/13/2010  . RECTAL BLEEDING 05/13/2010  . Sleep apnea 05/13/2010  . Dysphagia, oropharyngeal phase 05/13/2010    Past Surgical History:  Procedure Laterality Date  .  AV FISTULA PLACEMENT Right 01/27/2017   Procedure: ARTERIOVENOUS (AV) FISTULA CREATION-RIGHT ARM;  Surgeon: Elam Dutch, MD;  [facility];  Service: Vascular;  Laterality: Right;  . AV FISTULA PLACEMENT Left 08/03/2018   Procedure: LEFT ARTERIOVENOUS (AV) FISTULA CREATION;  Surgeon: Elam Dutch, MD;  Location: [place];  Service: Vascular;  Laterality: Left;  . BIOPSY N/A 05/17/2013   Procedure: BIOPSY;  Surgeon: [name], MD;  Location: [facility];  Service: Endoscopy;  Laterality: N/A;  . CHOLECYSTECTOMY N/A 04/27/2014    Procedure: LAPAROSCOPIC CHOLECYSTECTOMY;  Surgeon: [name], MD;  Location: [facility];  Service: General;  Laterality: N/A;  . COLONOSCOPY  06/06/2010   VHQ:IONGEX bleeding secondary to internal hemorrhoids but incomplete evaluation secondary to poor right colon prep/small rectal and sigmoid colon polyps (hyperplastic). PROPOFOL  . COLONOSCOPY  May 2013   Dr. Gilliam/[facility]: 5 mm a descending colon polyp, hyperplastic. Adequate bowel prep.  . ENDOMETRIAL ABLATION    . 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 ENUCLEATION Left 02/04/2018   Procedure: ENUCLEATION WITH PLACEMENT OF IMPLANT LEFT EYE;  Surgeon: Clista Bernhardt, MD;  Location: [place];  Service: Ophthalmology;  Laterality: Left;  . ESOPHAGOGASTRODUODENOSCOPY  06/06/2010   BMW:UXLKGMW erythema and edema of body of stomach, with sessile polypoid lesions. bx benign. no h.pylori  . ESOPHAGOGASTRODUODENOSCOPY (EGD) WITH PROPOFOL N/A 05/17/2013   Dr. Oneida Alar: normal esophagus, moderate nodular gastritis, negative path, empiric Savary dilation  . EYE SURGERY  07/2017   sx for glaucoma  . FLEXIBLE SIGMOIDOSCOPY N/A 05/17/2013   Dr. Oneida Alar: moderate sized internal hemorrhoids  . HEMORRHOID BANDING N/A 05/17/2013   Procedure: HEMORRHOID BANDING;  Surgeon: [name], MD;  Location: [facility];  Service: Endoscopy;  Laterality: N/A;  2 bands placed  . INCISION AND DRAINAGE ABSCESS Left 10/11/2013   Procedure: INCISION AND DRAINAGE AND DEBRIDEMENT LEFT BREAST  ABSCESS;  Surgeon: [name], MD;  Location: [facility];  Service: General;  Laterality: Left;  . INSERTION OF DIALYSIS CATHETER N/A 06/08/2018   Procedure: INSERTION OF Right internal jugular TUNNELED  DIALYSIS CATHETER;  Surgeon: Angelia Mould, MD;  Location: [place];  Service: Vascular;  Laterality: N/A;  . IRRIGATION AND DEBRIDEMENT ABSCESS Right 06/01/2013   Procedure: INCISION AND DRAINAGE AND DEBRIDEMENT ABSCESS RIGHT BREAST;   Surgeon: [name], MD;  Location: [facility];  Service: General;  Laterality: Right;  . LEFT EYE REMOVED Left 01/2018   [facility] [address].  Marland Kitchen LIGATION OF ARTERIOVENOUS  FISTULA Right 06/08/2018   Procedure: LIGATION OF ARTERIOVENOUS  FISTULA RIGHT ARM;  Surgeon: Angelia Mould, MD;  Location: [place];  Service: Vascular;  Laterality: Right;  . SAVORY DILATION N/A 05/17/2013   Procedure: SAVORY DILATION;  Surgeon: [name], MD;  Location: [facility];  Service: Endoscopy;  Laterality: N/A;  14/15/16  . SKIN FULL THICKNESS GRAFT Left 05/05/2018   Procedure: ABDOMINAL DERMIS FAT SKIN GRAFT FULL THICKNESS LEFT EYE;  Surgeon: Clista Bernhardt, MD;  Location: [place];  Service: Ophthalmology;  Laterality: Left;  . TUBAL LIGATION       OB History    Gravida  2   Para  2   Term  1   Preterm  1   AB      Living  2     SAB      TAB      Ectopic  Multiple      Live Births               Home Medications    Prior to Admission medications   Medication Sig Start Date End Date Taking? Authorizing Provider  albuterol (PROVENTIL) (2.5 MG/3ML) 0.083% nebulizer solution Take 2.5 mg by nebulization 4 (four) times daily.    [provider]  ALPRAZolam Duanne Moron) 1 MG tablet Take 0.5 tablets (0.5 mg total) by mouth 2 (two) times daily as needed for anxiety (nerves). 09/29/18   Johnson, Clanford L, MD  atorvastatin (LIPITOR) 20 MG tablet Take 20 mg by mouth daily.     [provider]  calcium carbonate (TUMS EX) 750 MG chewable tablet Chew 1-3 tablets by mouth See admin instructions. Patient takes 3 tablets with meals and 1 tablet with snacks three times a day    [provider]  colchicine 0.6 MG tablet Take 1 tablet (0.6 mg total) by mouth every Tuesday, Thursday, and Saturday at 6 PM. Take 1 tablet every other day 09/30/18   Murlean Iba, MD  dicyclomine (BENTYL) 10 MG capsule Take 1 capsule (10 mg total) by mouth 4 (four)  times daily. 09/29/18   Johnson, Clanford L, MD  doxycycline (VIBRAMYCIN) 100 MG capsule Take 1 capsule (100 mg total) by mouth 2 (two) times daily. 10/22/18   Sherwood Gambler, MD  Insulin Glargine (LANTUS SOLOSTAR) 100 UNIT/ML Solostar Pen Inject 30 Units into the skin every morning. And pen needles 1/day 06/29/18   Renato Shin, MD  lidocaine-prilocaine (EMLA) cream Apply 1 application topically as needed (topical anesthesia for hemodialysis if Gebauers and Lidocaine injection are ineffective.). 04/25/17   Johnson, Clanford L, MD  losartan (COZAAR) 25 MG tablet Take 25 mg by mouth daily.    [provider]  omeprazole (PRILOSEC) 40 MG capsule Take 40 mg by mouth daily.    [provider]  PARoxetine (PAXIL) 20 MG tablet Take 1 daily. This is to prevent panic attacks Patient taking differently: Take 20-40 mg by mouth daily. This is to prevent panic attacks 10/28/16   Sinda Du, MD  predniSONE (DELTASONE) 20 MG tablet Take 2 tablets (40 mg total) by mouth daily. 11/17/18   Davonna Belling, MD  torsemide (DEMADEX) 100 MG tablet Take 100 mg by mouth daily.     [provider]    Family History Family History  Adopted: Yes  Family history unknown: Yes    Social History Social History   Tobacco Use  . Smoking status: Former Smoker    Packs/day: 1.50    Years: 25.00    Pack years: 37.50    Types: Cigarettes    Last attempt to quit: 10/23/2007    Years since quitting: 11.0  . Smokeless tobacco: Never Used  Substance Use Topics  . Alcohol use: No  . Drug use: No     Allergies   Codeine and Tape   Review of Systems Review of Systems  Constitutional: Negative for chills and fever.  Respiratory: Negative.   Cardiovascular: Negative.   Musculoskeletal: Positive for arthralgias. Negative for joint swelling and myalgias.  Skin: Negative.  Negative for color change and wound.  Neurological: Positive for numbness. Negative for weakness.     Physical Exam  Updated Vital Signs BP 133/77 (BP Location: Right Arm)   Pulse 75   Temp 98.1 F (36.7 C) (Oral)   Resp 18   Ht 5\' 8"  (1.727 m)   Wt (!) 158.7 kg  LMP 08/03/2008 (Exact Date)   SpO2 98%   BMI 53.20 kg/m   Physical Exam Constitutional:      Appearance: She is well-developed.  HENT:     Head: Atraumatic.     Mouth/Throat:     Mouth: Mucous membranes are moist.  Neck:     Musculoskeletal: Normal range of motion.  Cardiovascular:     Rate and Rhythm: Normal rate.     Pulses: Normal pulses.          Radial pulses are 2+ on the right side and 2+ on the left side.     Comments: Pulses equal bilaterally Chest:     Comments: Dialysis catheter right upper chest. Old fistula right upper arm.  Musculoskeletal:        General: Tenderness present.     Comments: TTP left wrist and forearm.  No edema, no erythema.  Pt has FROM of fingers, wrist, elbow but with increased pain with wrist ROM.  Cap refill in fingertips less than 2 sec. Fingertip sensation to fine touch reduced left fingers excluding thumb.   Skin:    General: Skin is warm and dry.  Neurological:     Mental Status: She is alert.     Sensory: No sensory deficit.     Deep Tendon Reflexes: Reflexes normal.      ED Treatments / Results  Labs (all labs ordered are listed, but only abnormal results are displayed) Labs Reviewed - No data to display  EKG None  Radiology No results found.  Procedures Procedures (including critical care time)  Medications Ordered in ED Medications  HYDROmorphone (DILAUDID) injection 1 mg (1 mg Intramuscular Given 11/22/18 1046)  ondansetron (ZOFRAN-ODT) disintegrating tablet 4 mg (4 mg Oral Given 11/22/18 1045)     Initial Impression / Assessment and Plan / ED Course  I have reviewed the triage vital signs and the nursing notes.  Pertinent labs & imaging results that were available during my care of the patient were reviewed by me and considered in my medical decision making (see  chart for details).        Pt with known carpal tunnel syndrome left wrist anticipating surgical intervention in 3 days.  She has adequate control of her pain at home with her hydrocodone 10/325 mg.  Pain worsens with dialysis and removal of opioid with this tx.  She was given dilaudid 1 mg IM injection here and she started to get improvement in pain sx.  She was encouraged to take her hydrocodone immediately after her dialysis to try to get better control of her pain after dialysis. Plan for surgical intervention in 3 days.    Final Clinical Impressions(s) / ED Diagnoses   Final diagnoses:  Carpal tunnel syndrome of left wrist  Other chronic pain    ED Discharge Orders    None       Landis Martins 11/22/18 1135    Isla Pence, MD 11/22/18 1139

## 2018-11-22 NOTE — Discharge Instructions (Signed)
Do not drive within the next 4 hours as you have received a narcotic injection here which will make you drowsy.  Resume your home hydrocodone schedule as prescribed by your primary doctor. Keep your appointment with Dr. Burney Gauze on Thursday.

## 2018-11-24 ENCOUNTER — Other Ambulatory Visit: Payer: Self-pay

## 2018-11-24 ENCOUNTER — Encounter (HOSPITAL_COMMUNITY): Payer: Self-pay | Admitting: *Deleted

## 2018-11-24 MED ORDER — DEXTROSE 5 % IV SOLN
3.0000 g | INTRAVENOUS | Status: AC
  Administered 2018-11-25: 2 g via INTRAVENOUS
  Filled 2018-11-24: qty 3

## 2018-11-24 NOTE — Progress Notes (Signed)
Pt denies SOB, chest pain, and being under the care of a cardiologist. Pt denies having a stress test and cardiac cath. Pt made aware to stop taking vitamins, fish oil and herbal medications. Do not take any NSAIDs ie: Ibuprofen, Advil, Naproxen (Aleve), Motrin, BC and Goody Powder. Pt stated that she does not take Aspirin. Pt made aware to take 15 units of Lantus insulin on DOS if BG is > 70. Pt made aware to check BG every 2 hours prior to arrival to hospital on DOS. Pt made aware to treat a BG < 70 with 4 ounces of apple juice, wait 15 minutes after intervention to recheck BG, if BG remains < 70, or > 220, call Short Stay unit to speak with a nurse. Pt verbalized understanding of all pre-op instructions. PA, Anesthesiology asked to review pt history.

## 2018-11-24 NOTE — Anesthesia Preprocedure Evaluation (Addendum)
Anesthesia Evaluation  Patient identified by MRN, date of birth, ID band Patient awake    Reviewed: Allergy & Precautions, NPO status , Patient's Chart, lab work & pertinent test results  Airway Mallampati: II  TM Distance: >3 FB Neck ROM: Full    Dental  (+) Chipped,    Pulmonary sleep apnea and Continuous Positive Airway Pressure Ventilation , COPD,  COPD inhaler, former smoker,    Pulmonary exam normal breath sounds clear to auscultation       Cardiovascular hypertension, Pt. on medications +CHF  Normal cardiovascular exam Rhythm:Regular Rate:Normal  ECG: SR, rate 64  04/07/2017 TTE  Left ventricle: The cavity size was normal. Wall thickness was increased in a pattern of mild LVH. Systolic function was normal. The estimated ejection fraction was in the range of 60% to 65%. Wall motion was normal; there were no regional wall motion abnormalities. A full diastolic hemodynamic assessment was not performed. E/A ratio was normal.    Neuro/Psych PSYCHIATRIC DISORDERS Anxiety Depression Bipolar Disorder Cervical radiculopathy  Neuromuscular disease    GI/Hepatic GERD  Medicated and Controlled,  Endo/Other  diabetes, Type 2, Insulin DependentMorbid obesity ([place])  Renal/GU ESRF and DialysisRenal disease       Musculoskeletal  (+) Arthritis ,   Abdominal (+) + obese,   Peds  Hematology  (+) anemia ,     Anesthesia Other Findings   Reproductive/Obstetrics                          Anesthesia Physical Anesthesia Plan  ASA: IV  Anesthesia Plan: MAC   Post-op Pain Management:    Induction:   PONV Risk Score and Plan: 2 and Treatment may vary due to age or medical condition and Ondansetron  Airway Management Planned: Simple Face Mask and Natural Airway  Additional Equipment:   Intra-op Plan:   Post-operative Plan:   Informed Consent: I have reviewed the patients History and Physical,  chart, labs and discussed the procedure including the risks, benefits and alternatives for the proposed anesthesia with the patient or authorized representative who has indicated his/her understanding and acceptance.     Dental advisory given  Plan Discussed with: CRNA  Anesthesia Plan Comments: (Recently seen in ED 4/25 and treated for hyperglycemia with BG >600. No DKA, responded to fluid and insulin. Felt to be due to steroid recently prescribed for hand pain. Steroids were dc'd.  Echo 04/07/2017 showed EF 60% to 65%, normal wall motion, normal valves.  )      Anesthesia Quick Evaluation

## 2018-11-25 ENCOUNTER — Encounter (HOSPITAL_COMMUNITY)
Admission: RE | Disposition: A | Discharge status: Home / Self Care | Payer: Self-pay | Source: Home / Self Care | Attending: Orthopedic Surgery

## 2018-11-25 ENCOUNTER — Ambulatory Visit (HOSPITAL_COMMUNITY)
Admission: RE | Admit: 2018-11-25 | Discharge: 2018-11-25 | Disposition: A | Discharge status: Home / Self Care | Payer: Medicaid Other | Attending: Orthopedic Surgery | Admitting: Orthopedic Surgery

## 2018-11-25 ENCOUNTER — Ambulatory Visit (HOSPITAL_COMMUNITY): Payer: Medicaid Other | Admitting: Physician Assistant

## 2018-11-25 ENCOUNTER — Encounter (HOSPITAL_COMMUNITY): Payer: Self-pay

## 2018-11-25 DIAGNOSIS — N186 End stage renal disease: Secondary | ICD-10-CM | POA: Insufficient documentation

## 2018-11-25 DIAGNOSIS — F319 Bipolar disorder, unspecified: Secondary | ICD-10-CM | POA: Diagnosis not present

## 2018-11-25 DIAGNOSIS — Z6841 Body Mass Index (BMI) 40.0 and over, adult: Secondary | ICD-10-CM | POA: Insufficient documentation

## 2018-11-25 DIAGNOSIS — Z79899 Other long term (current) drug therapy: Secondary | ICD-10-CM | POA: Insufficient documentation

## 2018-11-25 DIAGNOSIS — K219 Gastro-esophageal reflux disease without esophagitis: Secondary | ICD-10-CM | POA: Diagnosis not present

## 2018-11-25 DIAGNOSIS — J449 Chronic obstructive pulmonary disease, unspecified: Secondary | ICD-10-CM | POA: Insufficient documentation

## 2018-11-25 DIAGNOSIS — X58XXXA Exposure to other specified factors, initial encounter: Secondary | ICD-10-CM | POA: Insufficient documentation

## 2018-11-25 DIAGNOSIS — Z992 Dependence on renal dialysis: Secondary | ICD-10-CM | POA: Insufficient documentation

## 2018-11-25 DIAGNOSIS — Z9049 Acquired absence of other specified parts of digestive tract: Secondary | ICD-10-CM | POA: Insufficient documentation

## 2018-11-25 DIAGNOSIS — I132 Hypertensive heart and chronic kidney disease with heart failure and with stage 5 chronic kidney disease, or end stage renal disease: Secondary | ICD-10-CM | POA: Diagnosis not present

## 2018-11-25 DIAGNOSIS — M1990 Unspecified osteoarthritis, unspecified site: Secondary | ICD-10-CM | POA: Insufficient documentation

## 2018-11-25 DIAGNOSIS — Z87891 Personal history of nicotine dependence: Secondary | ICD-10-CM | POA: Insufficient documentation

## 2018-11-25 DIAGNOSIS — T82898A Other specified complication of vascular prosthetic devices, implants and grafts, initial encounter: Secondary | ICD-10-CM | POA: Insufficient documentation

## 2018-11-25 DIAGNOSIS — Z91048 Other nonmedicinal substance allergy status: Secondary | ICD-10-CM | POA: Insufficient documentation

## 2018-11-25 DIAGNOSIS — D631 Anemia in chronic kidney disease: Secondary | ICD-10-CM | POA: Diagnosis not present

## 2018-11-25 DIAGNOSIS — G709 Myoneural disorder, unspecified: Secondary | ICD-10-CM | POA: Insufficient documentation

## 2018-11-25 DIAGNOSIS — F410 Panic disorder [episodic paroxysmal anxiety] without agoraphobia: Secondary | ICD-10-CM | POA: Insufficient documentation

## 2018-11-25 DIAGNOSIS — E1122 Type 2 diabetes mellitus with diabetic chronic kidney disease: Secondary | ICD-10-CM | POA: Insufficient documentation

## 2018-11-25 DIAGNOSIS — I509 Heart failure, unspecified: Secondary | ICD-10-CM | POA: Insufficient documentation

## 2018-11-25 DIAGNOSIS — G4733 Obstructive sleep apnea (adult) (pediatric): Secondary | ICD-10-CM | POA: Diagnosis not present

## 2018-11-25 DIAGNOSIS — K5900 Constipation, unspecified: Secondary | ICD-10-CM | POA: Diagnosis not present

## 2018-11-25 DIAGNOSIS — G5603 Carpal tunnel syndrome, bilateral upper limbs: Secondary | ICD-10-CM | POA: Diagnosis not present

## 2018-11-25 DIAGNOSIS — G5602 Carpal tunnel syndrome, left upper limb: Secondary | ICD-10-CM | POA: Diagnosis present

## 2018-11-25 DIAGNOSIS — M5412 Radiculopathy, cervical region: Secondary | ICD-10-CM | POA: Insufficient documentation

## 2018-11-25 DIAGNOSIS — E11319 Type 2 diabetes mellitus with unspecified diabetic retinopathy without macular edema: Secondary | ICD-10-CM | POA: Insufficient documentation

## 2018-11-25 DIAGNOSIS — G2581 Restless legs syndrome: Secondary | ICD-10-CM | POA: Diagnosis not present

## 2018-11-25 DIAGNOSIS — E669 Obesity, unspecified: Secondary | ICD-10-CM | POA: Insufficient documentation

## 2018-11-25 DIAGNOSIS — Z7951 Long term (current) use of inhaled steroids: Secondary | ICD-10-CM | POA: Insufficient documentation

## 2018-11-25 DIAGNOSIS — M5134 Other intervertebral disc degeneration, thoracic region: Secondary | ICD-10-CM | POA: Diagnosis not present

## 2018-11-25 DIAGNOSIS — Z888 Allergy status to other drugs, medicaments and biological substances status: Secondary | ICD-10-CM | POA: Insufficient documentation

## 2018-11-25 DIAGNOSIS — Z794 Long term (current) use of insulin: Secondary | ICD-10-CM | POA: Insufficient documentation

## 2018-11-25 DIAGNOSIS — F419 Anxiety disorder, unspecified: Secondary | ICD-10-CM | POA: Insufficient documentation

## 2018-11-25 DIAGNOSIS — Z885 Allergy status to narcotic agent status: Secondary | ICD-10-CM | POA: Insufficient documentation

## 2018-11-25 DIAGNOSIS — Z9001 Acquired absence of eye: Secondary | ICD-10-CM | POA: Insufficient documentation

## 2018-11-25 HISTORY — DX: Carpal tunnel syndrome, bilateral upper limbs: G56.03

## 2018-11-25 HISTORY — PX: CARPAL TUNNEL RELEASE: SHX101

## 2018-11-25 LAB — GLUCOSE, CAPILLARY
Glucose-Capillary: 229 mg/dL — ABNORMAL HIGH (ref 70–99)
Glucose-Capillary: 201 mg/dL — ABNORMAL HIGH (ref 70–99)
Glucose-Capillary: 186 mg/dL — ABNORMAL HIGH (ref 70–99)

## 2018-11-25 LAB — POCT I-STAT 4, (NA,K, GLUC, HGB,HCT)
Glucose, Bld: 246 mg/dL — ABNORMAL HIGH (ref 70–99)
HCT: 29 % — ABNORMAL LOW (ref 36.0–46.0)
Hemoglobin: 9.9 g/dL — ABNORMAL LOW (ref 12.0–15.0)
Potassium: 4.3 mmol/L (ref 3.5–5.1)
Sodium: 138 mmol/L (ref 135–145)

## 2018-11-25 SURGERY — CARPAL TUNNEL RELEASE
Anesthesia: Monitor Anesthesia Care | Site: Wrist | Laterality: Left

## 2018-11-25 MED ORDER — SODIUM CHLORIDE 0.9 % IV SOLN
INTRAVENOUS | Status: DC
  Administered 2018-11-25 (×2): via INTRAVENOUS

## 2018-11-25 MED ORDER — FENTANYL CITRATE (PF) 250 MCG/5ML IJ SOLN
INTRAMUSCULAR | Status: DC | PRN
  Administered 2018-11-25 (×2): 50 ug via INTRAVENOUS

## 2018-11-25 MED ORDER — SODIUM BICARBONATE 8.4 % IV SOLN
INTRAVENOUS | Status: AC
  Filled 2018-11-25: qty 50

## 2018-11-25 MED ORDER — OXYCODONE-ACETAMINOPHEN 5-325 MG PO TABS: 1.0000 | ORAL_TABLET | ORAL | 0 refills | Status: DC | PRN

## 2018-11-25 MED ORDER — CHLORHEXIDINE GLUCONATE 4 % EX LIQD: 60.0000 mL | Freq: Once | CUTANEOUS | Status: DC

## 2018-11-25 MED ORDER — MIDAZOLAM HCL 5 MG/5ML IJ SOLN
INTRAMUSCULAR | Status: DC | PRN
  Administered 2018-11-25: 2 mg via INTRAVENOUS

## 2018-11-25 MED ORDER — ONDANSETRON HCL 4 MG/2ML IJ SOLN
INTRAMUSCULAR | Status: DC | PRN
  Administered 2018-11-25: 4 mg via INTRAVENOUS

## 2018-11-25 MED ORDER — ONDANSETRON HCL 4 MG/2ML IJ SOLN: 4.0000 mg | Freq: Once | INTRAMUSCULAR | Status: DC | PRN

## 2018-11-25 MED ORDER — MIDAZOLAM HCL 2 MG/2ML IJ SOLN
INTRAMUSCULAR | Status: AC
  Filled 2018-11-25: qty 2

## 2018-11-25 MED ORDER — CEFAZOLIN SODIUM-DEXTROSE 2-4 GM/100ML-% IV SOLN
INTRAVENOUS | Status: AC
  Filled 2018-11-25: qty 100

## 2018-11-25 MED ORDER — PROPOFOL 10 MG/ML IV BOLUS
INTRAVENOUS | Status: AC
  Filled 2018-11-25: qty 20

## 2018-11-25 MED ORDER — LIDOCAINE-EPINEPHRINE 1 %-1:100000 IJ SOLN
INTRAMUSCULAR | Status: AC
  Filled 2018-11-25: qty 1

## 2018-11-25 MED ORDER — ACETAMINOPHEN 500 MG PO TABS
1000.0000 mg | ORAL_TABLET | Freq: Once | ORAL | Status: AC
  Administered 2018-11-25: 13:00:00 1000 mg via ORAL
  Filled 2018-11-25: qty 2

## 2018-11-25 MED ORDER — BUPIVACAINE HCL (PF) 0.25 % IJ SOLN
INTRAMUSCULAR | Status: AC
  Filled 2018-11-25: qty 30

## 2018-11-25 MED ORDER — 0.9 % SODIUM CHLORIDE (POUR BTL) OPTIME
TOPICAL | Status: DC | PRN
  Administered 2018-11-25: 1000 mL

## 2018-11-25 MED ORDER — FENTANYL CITRATE (PF) 100 MCG/2ML IJ SOLN: 25.0000 ug | INTRAMUSCULAR | Status: DC | PRN

## 2018-11-25 MED ORDER — FENTANYL CITRATE (PF) 250 MCG/5ML IJ SOLN
INTRAMUSCULAR | Status: AC
  Filled 2018-11-25: qty 5

## 2018-11-25 SURGICAL SUPPLY — 36 items
APL SKNCLS STERI-STRIP NONHPOA (GAUZE/BANDAGES/DRESSINGS) ×1
BANDAGE ACE 3X5.8 VEL STRL LF (GAUZE/BANDAGES/DRESSINGS) ×2 IMPLANT
BANDAGE ACE 4X5 VEL STRL LF (GAUZE/BANDAGES/DRESSINGS) ×2 IMPLANT
BANDAGE ELASTIC 4 VELCRO ST LF (GAUZE/BANDAGES/DRESSINGS) ×2 IMPLANT
BENZOIN TINCTURE PRP APPL 2/3 (GAUZE/BANDAGES/DRESSINGS) ×2 IMPLANT
BNDG CMPR 9X4 STRL LF SNTH (GAUZE/BANDAGES/DRESSINGS) ×1
BNDG ESMARK 4X9 LF (GAUZE/BANDAGES/DRESSINGS) ×2 IMPLANT
BNDG GAUZE ELAST 4 BULKY (GAUZE/BANDAGES/DRESSINGS) ×2 IMPLANT
CORDS BIPOLAR (ELECTRODE) ×2 IMPLANT
COVER SURGICAL LIGHT HANDLE (MISCELLANEOUS) ×2 IMPLANT
DRAPE SURG 17X23 STRL (DRAPES) ×2 IMPLANT
DURAPREP 26ML APPLICATOR (WOUND CARE) ×2 IMPLANT
GAUZE SPONGE 4X4 12PLY STRL (GAUZE/BANDAGES/DRESSINGS) ×2 IMPLANT
GAUZE SPONGE 4X4 12PLY STRL LF (GAUZE/BANDAGES/DRESSINGS) ×2 IMPLANT
GAUZE XEROFORM 1X8 LF (GAUZE/BANDAGES/DRESSINGS) ×2 IMPLANT
GLOVE SURG SYN 8.0 (GLOVE) ×2 IMPLANT
GOWN STRL REUS W/ TWL LRG LVL3 (GOWN DISPOSABLE) ×1 IMPLANT
GOWN STRL REUS W/ TWL XL LVL3 (GOWN DISPOSABLE) ×1 IMPLANT
GOWN STRL REUS W/TWL 2XL LVL3 (GOWN DISPOSABLE) ×2 IMPLANT
GOWN STRL REUS W/TWL LRG LVL3 (GOWN DISPOSABLE) ×2
GOWN STRL REUS W/TWL XL LVL3 (GOWN DISPOSABLE) ×2
KIT BASIN OR (CUSTOM PROCEDURE TRAY) ×2 IMPLANT
KIT TURNOVER KIT B (KITS) ×2 IMPLANT
NEEDLE HYPO 25GX1X1/2 BEV (NEEDLE) IMPLANT
NS IRRIG 1000ML POUR BTL (IV SOLUTION) ×2 IMPLANT
PACK ORTHO EXTREMITY (CUSTOM PROCEDURE TRAY) ×2 IMPLANT
PAD ARMBOARD 7.5X6 YLW CONV (MISCELLANEOUS) ×4 IMPLANT
PAD CAST 4YDX4 CTTN HI CHSV (CAST SUPPLIES) ×1 IMPLANT
PADDING CAST COTTON 4X4 STRL (CAST SUPPLIES) ×2
STRIP CLOSURE SKIN 1/2X4 (GAUZE/BANDAGES/DRESSINGS) ×2 IMPLANT
SUT PROLENE 3 0 PS 2 (SUTURE) ×2 IMPLANT
TOWEL OR 17X24 6PK STRL BLUE (TOWEL DISPOSABLE) ×2 IMPLANT
TOWEL OR 17X26 10 PK STRL BLUE (TOWEL DISPOSABLE) ×2 IMPLANT
TUBE CONNECTING 12X1/4 (SUCTIONS) ×2 IMPLANT
UNDERPAD 30X30 (UNDERPADS AND DIAPERS) ×2 IMPLANT
WATER STERILE IRR 1000ML POUR (IV SOLUTION) ×2 IMPLANT

## 2018-11-25 NOTE — Progress Notes (Signed)
Pt stated she took (1) Hydrocodone- Acetaminophen 325 this morning at 0800.    Per Dr. Valma Cava, give 1,000mg  Tylenol at 1300.

## 2018-11-25 NOTE — Anesthesia Postprocedure Evaluation (Signed)
Anesthesia Post Note  Patient: Martha [name]  Procedure(s) Performed: LEFT CARPAL TUNNEL RELEASE (Left Wrist)     Patient location during evaluation: PACU Anesthesia Type: MAC Level of consciousness: awake and alert Pain management: pain level controlled Vital Signs Assessment: post-procedure vital signs reviewed and stable Respiratory status: spontaneous breathing, nonlabored ventilation, respiratory function stable and patient connected to nasal cannula oxygen Cardiovascular status: stable and blood pressure returned to baseline Postop Assessment: no apparent nausea or vomiting Anesthetic complications: no    Last Vitals:  Vitals:   11/25/18 1449 11/25/18 1505  BP: 134/72 (!) 143/82  Pulse: 67 64  Resp: 15 19  Temp:  (!) 36.3 C  SpO2: 96% 98%    Last Pain:  Vitals:   11/25/18 1505  TempSrc:   PainSc: 0-No pain                 Mailani Degroote P Kevis Qu

## 2018-11-25 NOTE — Op Note (Signed)
Please see dictated report [phone]

## 2018-11-25 NOTE — Transfer of Care (Signed)
Immediate Anesthesia Transfer of Care Note  Patient: Martha [name]  Procedure(s) Performed: LEFT CARPAL TUNNEL RELEASE (Left Wrist)  Patient Location: PACU  Anesthesia Type:MAC combined with regional for post-op pain  Level of Consciousness: awake and patient cooperative  Airway & Oxygen Therapy: Patient Spontanous Breathing  Post-op Assessment: Report given to RN and Post -op Vital signs reviewed and stable  Post vital signs: Reviewed and stable  Last Vitals:  Vitals Value Taken Time  BP 145/72 11/25/2018  2:34 PM  Temp    Pulse 68 11/25/2018  2:35 PM  Resp 11 11/25/2018  2:35 PM  SpO2 98 % 11/25/2018  2:35 PM  Vitals shown include unvalidated device data.  Last Pain:  Vitals:   11/25/18 1107  TempSrc:   PainSc: 8       Patients Stated Pain Goal: 2 (46/04/79 [id number])  Complications: No apparent anesthesia complications

## 2018-11-25 NOTE — H&P (Signed)
Martha [name] is an 53 y.o. female.   Chief Complaint: Left hand pain, numbness, and tingling after dialysis particularly HPI: Patient is a 53 year old female with left carpal tunnel syndrome as well as steal syndrome with dialysis Mondays Wednesdays and Fridays on the left side.  Past Medical History:  Diagnosis Date  . Acid reflux    takes Tums  . Anemia   . Arthritis   . Bipolar 1 disorder ([place])   . Blindness of left eye   . Carpal tunnel syndrome, bilateral   . Cervical radiculopathy   . CHF (congestive heart failure) ([place])   . Chronic kidney disease    Stage 5- 01/25/17  . Constipation   . COPD (chronic obstructive pulmonary disease) ([place]) 2014   bronchitis  . Degenerative disc disease, thoracic   . Depression   . Diabetes mellitus    Type II  . Diabetic retinopathy ([place])   . Dyspnea    when walking  . End stage kidney disease ([place])    M, W, [facility]  . Hypertension   . Noncompliance with medication regimen   . Noncompliance with medication regimen   . Obesity (BMI 30-39.9)   . OSA (obstructive sleep apnea)    cpap  . Panic attack   . RLS (restless legs syndrome)     Past Surgical History:  Procedure Laterality Date  . AV FISTULA PLACEMENT Right 01/27/2017   Procedure: ARTERIOVENOUS (AV) FISTULA CREATION-RIGHT ARM;  Surgeon: Elam Dutch, MD;  Location: [facility] OR;  Service: Vascular;  Laterality: Right;  . AV FISTULA PLACEMENT Left 08/03/2018   Procedure: LEFT ARTERIOVENOUS (AV) FISTULA CREATION;  Surgeon: Elam Dutch, MD;  Location: [place];  Service: Vascular;  Laterality: Left;  . BIOPSY N/A 05/17/2013   Procedure: BIOPSY;  Surgeon: [name], MD;  Location: [facility];  Service: Endoscopy;  Laterality: N/A;  . CHOLECYSTECTOMY N/A 04/27/2014   Procedure: LAPAROSCOPIC CHOLECYSTECTOMY;  Surgeon: [name], MD;  Location: [facility];  Service: General;  Laterality: N/A;  . COLONOSCOPY  06/06/2010   KGM:[id number] bleeding secondary to internal  hemorrhoids but incomplete evaluation secondary to poor right colon prep/small rectal and sigmoid colon polyps (hyperplastic). PROPOFOL  . COLONOSCOPY  May 2013   Dr. Gilliam/[facility]: 5 mm a descending colon polyp, hyperplastic. Adequate bowel prep.  . ENDOMETRIAL ABLATION    . 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 ENUCLEATION Left 02/04/2018   Procedure: ENUCLEATION WITH PLACEMENT OF IMPLANT LEFT EYE;  Surgeon: Clista Bernhardt, MD;  Location: [place];  Service: Ophthalmology;  Laterality: Left;  . ESOPHAGOGASTRODUODENOSCOPY  06/06/2010   [id number] erythema and edema of body of stomach, with sessile polypoid lesions. bx benign. no h.pylori  . ESOPHAGOGASTRODUODENOSCOPY (EGD) WITH PROPOFOL N/A 05/17/2013   Dr. Oneida Alar: normal esophagus, moderate nodular gastritis, negative path, empiric Savary dilation  . EYE SURGERY  07/2017   sx for glaucoma  . FLEXIBLE SIGMOIDOSCOPY N/A 05/17/2013   Dr. Oneida Alar: moderate sized internal hemorrhoids  . HEMORRHOID BANDING N/A 05/17/2013   Procedure: HEMORRHOID BANDING;  Surgeon: [name], MD;  Location: [facility];  Service: Endoscopy;  Laterality: N/A;  2 bands placed  . INCISION AND DRAINAGE ABSCESS Left 10/11/2013   Procedure: INCISION AND DRAINAGE AND DEBRIDEMENT LEFT BREAST  ABSCESS;  Surgeon: [name], MD;  Location: [facility];  Service: General;  Laterality: Left;  . INSERTION OF DIALYSIS CATHETER N/A 06/08/2018   Procedure: INSERTION  OF Right internal jugular TUNNELED  DIALYSIS CATHETER;  Surgeon: Angelia Mould, MD;  Location: [place];  Service: Vascular;  Laterality: N/A;  . IRRIGATION AND DEBRIDEMENT ABSCESS Right 06/01/2013   Procedure: INCISION AND DRAINAGE AND DEBRIDEMENT ABSCESS RIGHT BREAST;  Surgeon: [name], MD;  Location: [facility];  Service: General;  Laterality: Right;  . LEFT EYE REMOVED Left 01/2018   [facility] [address].  Marland Kitchen LIGATION OF ARTERIOVENOUS   FISTULA Right 06/08/2018   Procedure: LIGATION OF ARTERIOVENOUS  FISTULA RIGHT ARM;  Surgeon: Angelia Mould, MD;  Location: [place];  Service: Vascular;  Laterality: Right;  . SAVORY DILATION N/A 05/17/2013   Procedure: SAVORY DILATION;  Surgeon: [name], MD;  Location[facility];  Service: Endoscopy;  Laterality: N/A;  14/15/16  . SKIN FULL THICKNESS GRAFT Left 05/05/2018   Procedure: ABDOMINAL DERMIS FAT SKIN GRAFT FULL THICKNESS LEFT EYE;  Surgeon: Clista Bernhardt, MD;  Location: [place];  Service: Ophthalmology;  Laterality: Left;  . TUBAL LIGATION      Family History  Adopted: Yes  Family history unknown: Yes   Social History:  reports that she quit smoking about 11 years ago. Her smoking use included cigarettes. She has a 37.50 pack-year smoking history. She has never used smokeless tobacco. She reports that she does not drink alcohol or use drugs.  Allergies:  Allergies  Allergen Reactions  . Codeine Nausea And Vomiting  . Tape Rash and Other (See Comments)    Pull skin off.  Paper tape is ok    Medications Prior to Admission  Medication Sig Dispense Refill  . albuterol (PROVENTIL) (2.5 MG/3ML) 0.083% nebulizer solution Take 2.5 mg by nebulization every 6 (six) hours as needed for wheezing or shortness of breath.     . ALPRAZolam (XANAX) 1 MG tablet Take 0.5 tablets (0.5 mg total) by mouth 2 (two) times daily as needed for anxiety (nerves). (Patient taking differently: Take 0.5 mg by mouth 4 (four) times daily. ) 30 tablet 0  . atorvastatin (LIPITOR) 20 MG tablet Take 20 mg by mouth daily.     . busPIRone (BUSPAR) 7.5 MG tablet Take 7.5 mg by mouth 2 (two) times daily.    . calcium carbonate (TUMS EX) 750 MG chewable tablet Chew 2-4 tablets by mouth See admin instructions. Patient takes 4 tablets with meals and 2 tablet with snacks three times a day    . cetirizine (ZYRTEC) 10 MG chewable tablet Chew 10 mg by mouth daily.    . cinacalcet (SENSIPAR) 30 MG tablet Take  30 mg by mouth daily.    . colchicine 0.6 MG tablet Take 1 tablet (0.6 mg total) by mouth every Tuesday, Thursday, and Saturday at 6 PM. Take 1 tablet every other day    . gabapentin (NEURONTIN) 300 MG capsule Take 300 mg by mouth at bedtime.    Marland Kitchen HYDROcodone-acetaminophen (NORCO) 10-325 MG tablet Take 1 tablet by mouth every 6 (six) hours as needed.    . Insulin Glargine (LANTUS SOLOSTAR) 100 UNIT/ML Solostar Pen Inject 30 Units into the skin every morning. And pen needles 1/day 15 mL 11  . losartan (COZAAR) 25 MG tablet Take 25 mg by mouth daily.    Marland Kitchen omeprazole (PRILOSEC) 40 MG capsule Take 40 mg by mouth daily.    Marland Kitchen PARoxetine (PAXIL) 20 MG tablet Take 1 daily. This is to prevent panic attacks (Patient taking differently: Take 20 mg by mouth daily. This is to prevent  panic attacks) 30 tablet 12  . torsemide (DEMADEX) 100 MG tablet Take 100 mg by mouth 2 (two) times daily.     Marland Kitchen dicyclomine (BENTYL) 10 MG capsule Take 1 capsule (10 mg total) by mouth 4 (four) times daily. (Patient not taking: Reported on 11/22/2018)    . doxycycline (VIBRAMYCIN) 100 MG capsule Take 1 capsule (100 mg total) by mouth 2 (two) times daily. (Patient not taking: Reported on 11/22/2018) 13 capsule 0  . lidocaine-prilocaine (EMLA) cream Apply 1 application topically as needed (topical anesthesia for hemodialysis if Gebauers and Lidocaine injection are ineffective.). 30 g 0  . predniSONE (DELTASONE) 20 MG tablet Take 2 tablets (40 mg total) by mouth daily. (Patient not taking: Reported on 11/22/2018) 6 tablet 0    Results for orders placed or performed during the hospital encounter of 11/25/18 (from the past 48 hour(s))  Glucose, capillary     Status: Abnormal   Collection Time: 11/25/18 10:37 AM  Result Value Ref Range   Glucose-Capillary 229 (H) 70 - 99 mg/dL  I-STAT 4, (NA,K, GLUC, HGB,HCT)     Status: Abnormal   Collection Time: 11/25/18 11:17 AM  Result Value Ref Range   Sodium 138 135 - 145 mmol/L   Potassium  4.3 3.5 - 5.1 mmol/L   Glucose, Bld 246 (H) 70 - 99 mg/dL   HCT 29.0 (L) 36.0 - 46.0 %   Hemoglobin 9.9 (L) 12.0 - 15.0 g/dL  Glucose, capillary     Status: Abnormal   Collection Time: 11/25/18 12:35 PM  Result Value Ref Range   Glucose-Capillary 201 (H) 70 - 99 mg/dL   No results found.  Review of Systems  All other systems reviewed and are negative.   Blood pressure (!) 157/83, pulse 66, temperature 97.7 F (36.5 C), temperature source Oral, resp. rate 18, height 5\' 8"  (1.727 m), weight (!) 158.8 kg, last menstrual period 08/03/2008, SpO2 98 %. Physical Exam  Constitutional: She is oriented to person, place, and time. She appears well-developed and well-nourished.  HENT:  Head: Normocephalic and atraumatic.  Neck: Normal range of motion.  Cardiovascular: Normal rate.  Respiratory: Effort normal.  Musculoskeletal:     Left wrist: She exhibits tenderness.     Comments: Left hand and wrist positive Tinel's, Phalen's, and median nerve compression test  Neurological: She is alert and oriented to person, place, and time.  Skin: Skin is warm.  Psychiatric: She has a normal mood and affect. Her behavior is normal. Judgment and thought content normal.     Assessment/Plan 53 year old female with documented carpal tunnel syndrome both clinically and electrodiagnostic Li on the left side as well as probable steal syndrome.  Have discussed the role of left carpal tunnel release under straight local anesthetic.  She understands the risks and benefits and wishes to proceed.  Schuyler Amor, MD 11/25/2018, 1:26 PM

## 2018-11-25 NOTE — Op Note (Signed)
NAME: Martha [name], DOOLAN MEDICAL RECORD PO:[id number] ACCOUNT [id number] DATE OF BIRTH:1966/05/04 FACILITY: [facility] LOCATION: [facility] PHYSICIAN:Cassara Nida A. Burney Gauze, MD  OPERATIVE REPORT  DATE OF PROCEDURE:  11/25/2018  PREOPERATIVE DIAGNOSIS:  Chronic left carpal tunnel syndrome.  POSTOPERATIVE DIAGNOSIS:  Chronic left carpal tunnel syndrome.  PROCEDURE:  Left carpal tunnel release.  SURGEON:  Charlotte Crumb, MD  ASSISTANT:  None.  ANESTHESIA:  Local with IV sedation.  COMPLICATIONS:  No complications.  DRAINS:  No drains.  DESCRIPTION OF PROCEDURE:  Twenty-five minutes prior to being taken to the operating suite, I injected 20 mL of 1% lidocaine with epinephrine 1:100,000 and 2 mL of bicarbonate solution in and around the palm and distal forearm area around the carpal  tunnel on the left side.  After 25 minutes, the patient was taken to the operating suite, prepped and draped in the usual sterile fashion.  A 2 cm incision was made in the palmar aspect of the left hand starting at Kaplan's cardinal line.  The skin was  incised.  Dissection was carried down to the skin and subcutaneous tissues.  The palmar fascia was identified and split.  The distal edge of the transverse carpal ligament was identified, split with a 15 blade.  The median nerve was identified and  protected with a Soil scientist.  The remaining aspect of the transverse carpal ligament was then divided under direct vision using a curved blunt scissor.  The canal was inspected.  There were no osseous lesions or ganglia present.  It was irrigated and  loosely closed with a 3-0 Prolene subcuticular stitch.  Steri-Strips, 4 x 4 fluffs and a compressive bandage was applied.  The patient tolerated this procedure well and went to recovery in stable fashion.  LN/NUANCE  D:11/25/2018 T:11/25/2018 JOB:[id number]

## 2018-11-26 ENCOUNTER — Encounter (HOSPITAL_COMMUNITY): Payer: Self-pay | Admitting: Orthopedic Surgery

## 2018-11-28 ENCOUNTER — Emergency Department (HOSPITAL_COMMUNITY): Payer: Medicaid Other

## 2018-11-28 ENCOUNTER — Other Ambulatory Visit: Payer: Self-pay

## 2018-11-28 ENCOUNTER — Encounter (HOSPITAL_COMMUNITY): Payer: Self-pay | Admitting: Emergency Medicine

## 2018-11-28 ENCOUNTER — Emergency Department (HOSPITAL_COMMUNITY)
Admission: EM | Admit: 2018-11-28 | Discharge: 2018-11-28 | Disposition: A | Discharge status: Home / Self Care | Payer: Medicaid Other | Attending: Emergency Medicine | Admitting: Emergency Medicine

## 2018-11-28 DIAGNOSIS — J449 Chronic obstructive pulmonary disease, unspecified: Secondary | ICD-10-CM | POA: Diagnosis not present

## 2018-11-28 DIAGNOSIS — Z794 Long term (current) use of insulin: Secondary | ICD-10-CM | POA: Diagnosis not present

## 2018-11-28 DIAGNOSIS — Z87891 Personal history of nicotine dependence: Secondary | ICD-10-CM | POA: Diagnosis not present

## 2018-11-28 DIAGNOSIS — E875 Hyperkalemia: Secondary | ICD-10-CM | POA: Insufficient documentation

## 2018-11-28 DIAGNOSIS — R251 Tremor, unspecified: Secondary | ICD-10-CM | POA: Diagnosis not present

## 2018-11-28 DIAGNOSIS — I509 Heart failure, unspecified: Secondary | ICD-10-CM | POA: Diagnosis not present

## 2018-11-28 DIAGNOSIS — Z79899 Other long term (current) drug therapy: Secondary | ICD-10-CM | POA: Diagnosis not present

## 2018-11-28 DIAGNOSIS — E1122 Type 2 diabetes mellitus with diabetic chronic kidney disease: Secondary | ICD-10-CM | POA: Insufficient documentation

## 2018-11-28 DIAGNOSIS — I132 Hypertensive heart and chronic kidney disease with heart failure and with stage 5 chronic kidney disease, or end stage renal disease: Secondary | ICD-10-CM | POA: Diagnosis not present

## 2018-11-28 DIAGNOSIS — N186 End stage renal disease: Secondary | ICD-10-CM | POA: Insufficient documentation

## 2018-11-28 DIAGNOSIS — Z992 Dependence on renal dialysis: Secondary | ICD-10-CM | POA: Insufficient documentation

## 2018-11-28 LAB — BASIC METABOLIC PANEL
Anion gap: 16 — ABNORMAL HIGH (ref 5–15)
BUN: 108 mg/dL — ABNORMAL HIGH (ref 6–20)
CO2: 20 mmol/L — ABNORMAL LOW (ref 22–32)
Calcium: 9.2 mg/dL (ref 8.9–10.3)
Chloride: 105 mmol/L (ref 98–111)
Creatinine, Ser: 10.56 mg/dL — ABNORMAL HIGH (ref 0.44–1.00)
GFR calc Af Amer: 4 mL/min — ABNORMAL LOW (ref >60)
GFR calc non Af Amer: 4 mL/min — ABNORMAL LOW (ref >60)
Glucose, Bld: 314 mg/dL — ABNORMAL HIGH (ref 70–99)
Potassium: 5.7 mmol/L — ABNORMAL HIGH (ref 3.5–5.1)
Sodium: 141 mmol/L (ref 135–145)

## 2018-11-28 LAB — CBC WITH DIFFERENTIAL/PLATELET
Abs Immature Granulocytes: 0.01 10*3/uL (ref 0.00–0.07)
Basophils Absolute: 0 10*3/uL (ref 0.0–0.1)
Basophils Relative: 0 %
Eosinophils Absolute: 0.1 10*3/uL (ref 0.0–0.5)
Eosinophils Relative: 2 %
HCT: 28.6 % — ABNORMAL LOW (ref 36.0–46.0)
Hemoglobin: 8.9 g/dL — ABNORMAL LOW (ref 12.0–15.0)
Immature Granulocytes: 0 %
Lymphocytes Relative: 22 %
Lymphs Abs: 1.2 10*3/uL (ref 0.7–4.0)
MCH: 28.6 pg (ref 26.0–34.0)
MCHC: 31.1 g/dL (ref 30.0–36.0)
MCV: 92 fL (ref 80.0–100.0)
Monocytes Absolute: 0.3 10*3/uL (ref 0.1–1.0)
Monocytes Relative: 6 %
Neutro Abs: 3.6 10*3/uL (ref 1.7–7.7)
Neutrophils Relative %: 70 %
Platelets: 136 10*3/uL — ABNORMAL LOW (ref 150–400)
RBC: 3.11 MIL/uL — ABNORMAL LOW (ref 3.87–5.11)
RDW: 16.7 % — ABNORMAL HIGH (ref 11.5–15.5)
WBC: 5.2 10*3/uL (ref 4.0–10.5)
nRBC: 0 % (ref 0.0–0.2)

## 2018-11-28 LAB — CBG MONITORING, ED: Glucose-Capillary: 279 mg/dL — ABNORMAL HIGH (ref 70–99)

## 2018-11-28 MED ORDER — SODIUM POLYSTYRENE SULFONATE 15 GM/60ML PO SUSP
15.0000 g | Freq: Once | ORAL | Status: AC
  Administered 2018-11-28: 15 g via ORAL
  Filled 2018-11-28: qty 60

## 2018-11-28 NOTE — ED Provider Notes (Signed)
[facility] EMERGENCY DEPARTMENT Provider Note   CSN: [id number] Arrival date & time: 11/28/18  1707    History   Chief Complaint Chief Complaint  Patient presents with   Tremors    HPI Martha [name] is a 53 y.o. female with a history as outlined below, most significant for carpal tunnel syndrome (underwent surgical intervention 3 days ago), CHF, COPD, DM (last cbg this am was around 170) and ESRD, last dialysis 4 days ago as she missed dialysis 2 days ago (Friday) due to post op pain developed a tremor in her arms this morning which she recognizes as a symptom of hyperkalemia.  She is concerned she may need dialysis today. She denies focal weakness, chest pain, sob, palpitations or spasm.  She was prescribed oxycodone which is adequately controlling her post surgical wrist pain.      The history is provided by the patient.    Past Medical History:  Diagnosis Date   Acid reflux    takes Tums   Anemia    Arthritis    Bipolar 1 disorder (HCC)    Blindness of left eye    Carpal tunnel syndrome, bilateral    Cervical radiculopathy    CHF (congestive heart failure) (HCC)    Chronic kidney disease    Stage 5- 01/25/17   Constipation    COPD (chronic obstructive pulmonary disease) ([place]) 2014   bronchitis   Degenerative disc disease, thoracic    Depression    Diabetes mellitus    Type II   Diabetic retinopathy ([place])    Dyspnea    when walking   End stage kidney disease ([place])    M, W, F [facility]   Hypertension    Noncompliance with medication regimen    Noncompliance with medication regimen    Obesity (BMI 30-39.9)    OSA (obstructive sleep apnea)    cpap   Panic attack    RLS (restless legs syndrome)     Patient Active Problem List   Diagnosis Date Noted   Metabolic encephalopathy 01/21/9484   SIRS (systemic inflammatory response syndrome) ([place]) 06/23/2018   UTI (urinary tract infection) 02/26/2018   Volume overload 02/25/2018     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   Dialysis patient ([place]) 12/13/2017   ESRD (end stage renal disease) on dialysis ([place])    Chronic diarrhea 10/08/2017   Renal failure (ARF), acute on chronic ([place]) 04/22/2017   Anasarca associated with disorder of kidney 04/20/2017   Protein-calorie malnutrition, severe 11/29/2016   HTN (hypertension), malignant 11/20/2016   Bipolar 1 disorder ([place]) 11/20/2016   Noncompliance with medication regimen 11/20/2016   Panic attack 11/20/2016   Uncontrolled type 2 diabetes mellitus with hyperglycemia, without long-term current use of insulin ([place]) 10/27/2016   Sensory disturbance 10/27/2016   Left hemiparesis ([place]) 10/27/2016   Cholelithiasis 04/26/2014   Hyponatremia 04/26/2014   Gallstones 04/06/2014   Diabetic ulcer of left great toe ([place]) 10/09/2013   Diabetes mellitus, type 2 ([place]) 05/30/2013   Hyperglycemia 05/30/2013   Internal hemorrhoids with other complication 46/27/0350   Umbilical hernia without mention of obstruction or gangrene 04/15/2013   Anemia 04/15/2013   DM 05/13/2010   Hyperlipidemia 05/13/2010   Morbid obesity ([place]) 05/13/2010   Depression with anxiety 05/13/2010   RESTLESS LEG SYNDROME 05/13/2010   Essential hypertension 05/13/2010   GERD 05/13/2010   RECTAL BLEEDING 05/13/2010   Sleep apnea 05/13/2010   Dysphagia, oropharyngeal phase 05/13/2010    Past Surgical History:  Procedure Laterality Date   AV FISTULA PLACEMENT Right 01/27/2017   Procedure: ARTERIOVENOUS (AV) FISTULA CREATION-RIGHT ARM;  Surgeon: Elam Dutch, MD;  Location: [place];  Service: Vascular;  Laterality: Right;   AV FISTULA PLACEMENT Left 08/03/2018   Procedure: LEFT ARTERIOVENOUS (AV) FISTULA CREATION;  Surgeon: Elam Dutch, MD;  Location: [place];  Service: Vascular;  Laterality: Left;   BIOPSY N/A 05/17/2013   Procedure: BIOPSY;  Surgeon: [name], MD;  Location: [facility];  Service:  Endoscopy;  Laterality: N/A;   CARPAL TUNNEL RELEASE Left 11/25/2018   Procedure: LEFT CARPAL TUNNEL RELEASE;  Surgeon: Charlotte Crumb, MD;  Location: [place];  Service: Orthopedics;  Laterality: Left;   CHOLECYSTECTOMY N/A 04/27/2014   Procedure: LAPAROSCOPIC CHOLECYSTECTOMY;  Surgeon: [name], MD;  Location: [facility];  Service: General;  Laterality: N/A;   COLONOSCOPY  06/06/2010   ZOX:WRUEAV bleeding secondary to internal hemorrhoids but incomplete evaluation secondary to poor right colon prep/small rectal and sigmoid colon polyps (hyperplastic). PROPOFOL   COLONOSCOPY  May 2013   Dr. Gilliam/[facility]: 5 mm a descending colon polyp, hyperplastic. Adequate bowel prep.   ENDOMETRIAL ABLATION     ENUCLEATION Left 02/04/2018   ENUCLEATION WITH PLACEMENT OF IMPLANT LEFT EYE   ENUCLEATION Left 02/04/2018   Procedure: ENUCLEATION WITH PLACEMENT OF IMPLANT LEFT EYE;  Surgeon: Clista Bernhardt, MD;  Location: [place];  Service: Ophthalmology;  Laterality: Left;   ESOPHAGOGASTRODUODENOSCOPY  06/06/2010   WUJ:WJXBJYN erythema and edema of body of stomach, with sessile polypoid lesions. bx benign. no h.pylori   ESOPHAGOGASTRODUODENOSCOPY (EGD) WITH PROPOFOL N/A 05/17/2013   Dr. Oneida Alar: normal esophagus, moderate nodular gastritis, negative path, empiric Savary dilation   EYE SURGERY  07/2017   sx for glaucoma   FLEXIBLE SIGMOIDOSCOPY N/A 05/17/2013   Dr. Oneida Alar: moderate sized internal hemorrhoids   HEMORRHOID BANDING N/A 05/17/2013   Procedure: HEMORRHOID BANDING;  Surgeon: [name], MD;  Location: [facility];  Service: Endoscopy;  Laterality: N/A;  2 bands placed   INCISION AND DRAINAGE ABSCESS Left 10/11/2013   Procedure: INCISION AND DRAINAGE AND DEBRIDEMENT LEFT BREAST  ABSCESS;  Surgeon: [name], MD;  Location: [facility];  Service: General;  Laterality: Left;   INSERTION OF DIALYSIS CATHETER N/A 06/08/2018   Procedure: INSERTION OF Right internal jugular TUNNELED   DIALYSIS CATHETER;  Surgeon: Angelia Mould, MD;  Location: [place];  Service: Vascular;  Laterality: N/A;   [place] Right 06/01/2013   Procedure: INCISION AND DRAINAGE AND DEBRIDEMENT ABSCESS RIGHT BREAST;  Surgeon: [name], MD;  Location: [facility];  Service: General;  Laterality: Right;   LEFT EYE REMOVED Left 01/2018   [facility] [address].   LIGATION OF ARTERIOVENOUS  FISTULA Right 06/08/2018   Procedure: LIGATION OF ARTERIOVENOUS  FISTULA RIGHT ARM;  Surgeon: Angelia Mould, MD;  Location: [place];  Service: Vascular;  Laterality: Right;   SAVORY DILATION N/A 05/17/2013   Procedure: SAVORY DILATION;  Surgeon: [name], MD;  Location: [facility];  Service: Endoscopy;  Laterality: N/A;  14/15/16   SKIN FULL THICKNESS GRAFT Left 05/05/2018   Procedure: ABDOMINAL DERMIS FAT SKIN GRAFT FULL THICKNESS LEFT EYE;  Surgeon: Clista Bernhardt, MD;  Location: [place];  Service: Ophthalmology;  Laterality: Left;   TUBAL LIGATION       OB History    Gravida  2   Para  2   Term  1   Preterm  1   AB      Living  2     SAB      TAB      Ectopic      Multiple      Live Births               Home Medications    Prior to Admission medications   Medication Sig Start Date End Date Taking? Authorizing Provider  albuterol (PROVENTIL) (2.5 MG/3ML) 0.083% nebulizer solution Take 2.5 mg by nebulization every 6 (six) hours as needed for wheezing or shortness of breath.    Yes [provider]  ALPRAZolam Duanne Moron) 1 MG tablet Take 0.5 tablets (0.5 mg total) by mouth 2 (two) times daily as needed for anxiety (nerves). Patient taking differently: Take 0.5 mg by mouth 4 (four) times daily.  09/29/18  Yes Johnson, Clanford L, MD  atorvastatin (LIPITOR) 20 MG tablet Take 20 mg by mouth daily.    Yes [provider]  busPIRone (BUSPAR) 7.5 MG tablet Take 7.5 mg by mouth 2 (two) times daily.   Yes [provider]  calcium carbonate (TUMS EX) 750 MG chewable tablet Chew 2-4 tablets by mouth See admin instructions. Patient takes 4 tablets with meals and 2 tablet with snacks three times a day   Yes [provider]  cetirizine (ZYRTEC) 10 MG chewable tablet Chew 10 mg by mouth daily.   Yes [provider]  cinacalcet (SENSIPAR) 30 MG tablet Take 30 mg by mouth daily.   Yes [provider]  colchicine 0.6 MG tablet Take 1 tablet (0.6 mg total) by mouth every Tuesday, Thursday, and Saturday at 6 PM. Take 1 tablet every other day 09/30/18  Yes Johnson, Clanford L, MD  gabapentin (NEURONTIN) 300 MG capsule Take 300 mg by mouth at bedtime.   Yes [provider]  HYDROcodone-acetaminophen (NORCO) 10-325 MG tablet Take 1 tablet by mouth every 6 (six) hours as needed.   Yes [provider]  Insulin Glargine (LANTUS SOLOSTAR) 100 UNIT/ML Solostar Pen Inject 30 Units into the skin every morning. And pen needles 1/day 06/29/18  Yes Renato Shin, MD  lidocaine-prilocaine (EMLA) cream Apply 1 application topically as needed (topical anesthesia for hemodialysis if Gebauers and Lidocaine injection are ineffective.). 04/25/17  Yes Johnson, Clanford L, MD  losartan (COZAAR) 25 MG tablet Take 25 mg by mouth daily.   Yes [provider]  omeprazole (PRILOSEC) 40 MG capsule Take 40 mg by mouth daily.   Yes [provider]  oxyCODONE-acetaminophen (PERCOCET) 5-325 MG tablet Take 1 tablet by mouth every 4 (four) hours as needed for severe pain. 11/25/18 11/25/19 Yes Charlotte Crumb, MD  PARoxetine (PAXIL) 20 MG tablet Take 1 daily. This is to prevent panic attacks Patient taking differently: Take 20 mg by mouth daily. This is to prevent panic attacks 10/28/16  Yes Sinda Du, MD  torsemide (DEMADEX) 100 MG tablet Take 100 mg by mouth 2 (two) times daily.    Yes [provider]    Family History Family History  Adopted: Yes  Family history  unknown: Yes    Social History Social History   Tobacco Use   Smoking status: Former Smoker    Packs/day: 1.50    Years: 25.00    Pack years: 37.50    Types: Cigarettes    Last attempt to quit: 10/23/2007    Years since quitting: 11.1   Smokeless tobacco: Never Used  Substance Use Topics   Alcohol  use: No   Drug use: No     Allergies   Codeine and Tape   Review of Systems Review of Systems  Constitutional: Negative for chills, diaphoresis, fatigue and fever.  HENT: Negative for congestion.   Eyes: Negative.   Respiratory: Negative for chest tightness and shortness of breath.   Cardiovascular: Negative for chest pain.  Gastrointestinal: Negative for abdominal pain, nausea and vomiting.  Genitourinary: Negative.   Musculoskeletal: Negative for arthralgias and joint swelling.  Skin: Negative.  Negative for rash and wound.  Neurological: Positive for tremors. Negative for dizziness, seizures, syncope, speech difficulty, weakness, light-headedness, numbness and headaches.  Hematological: Negative.   Psychiatric/Behavioral: Negative.      Physical Exam Updated Vital Signs BP (!) 164/87 (BP Location: Right Arm)    Pulse 88    Temp 98.1 F (36.7 C) (Oral)    Ht 5\' 8"  (1.727 m)    Wt (!) 158.8 kg    LMP 08/03/2008 (Exact Date)    SpO2 95%    BMI 53.22 kg/m   Physical Exam Vitals signs and nursing note reviewed.  Constitutional:      Appearance: She is well-developed.  HENT:     Head: Normocephalic and atraumatic.  Eyes:     Conjunctiva/sclera: Conjunctivae normal.  Cardiovascular:     Rate and Rhythm: Normal rate and regular rhythm.     Heart sounds: Normal heart sounds.  Pulmonary:     Effort: Pulmonary effort is normal.     Breath sounds: Normal breath sounds. No wheezing.  Abdominal:     General: Bowel sounds are normal.     Palpations: Abdomen is soft.     Tenderness: There is no abdominal tenderness.  Musculoskeletal: Normal range of motion.      Comments: Clean dressing/ace wrap left hand and forearm.  Skin:    General: Skin is warm and dry.  Neurological:     General: No focal deficit present.     Mental Status: She is alert and oriented to person, place, and time.     Cranial Nerves: No cranial nerve deficit.     Sensory: Sensation is intact. No sensory deficit.     Motor: Tremor present. No weakness.     Comments: Controlled appearing pronation/supination of left forearm consistently through exam.       ED Treatments / Results  Labs (all labs ordered are listed, but only abnormal results are displayed) Labs Reviewed  CBC WITH DIFFERENTIAL/PLATELET - Abnormal; Notable for the following components:      Result Value   RBC 3.11 (*)    Hemoglobin 8.9 (*)    HCT 28.6 (*)    RDW 16.7 (*)    Platelets 136 (*)    All other components within normal limits  BASIC METABOLIC PANEL - Abnormal; Notable for the following components:   Potassium 5.7 (*)    CO2 20 (*)    Glucose, Bld 314 (*)    BUN 108 (*)    Creatinine, Ser 10.56 (*)    GFR calc non Af Amer 4 (*)    GFR calc Af Amer 4 (*)    Anion gap 16 (*)    All other components within normal limits  CBG MONITORING, ED - Abnormal; Notable for the following components:   Glucose-Capillary 279 (*)    All other components within normal limits    EKG EKG Interpretation  Date/Time:  Sunday Nov 28 2018 17:20:01 EDT Ventricular Rate:  90 PR Interval:  QRS Duration: 79 QT Interval:  350 QTC Calculation: 429 R Axis:   31 Text Interpretation:  Sinus rhythm Low voltage, precordial leads Nonspecific T abnormalities, lateral leads Confirmed by Nat Christen [phone]) on 11/28/2018 8:03:00 PM   Radiology Ct Head Wo Contrast  Result Date: 11/28/2018 CLINICAL DATA:  Jerking and tremors that began today, end-stage renal disease on dialysis, CHF, type II diabetes mellitus, hypertension, COPD EXAM: CT HEAD WITHOUT CONTRAST TECHNIQUE: Contiguous axial images were obtained from the base  of the skull through the vertex without intravenous contrast. Sagittal and coronal MPR images reconstructed from axial data set. COMPARISON:  09/26/2018 FINDINGS: Brain: Normal ventricular morphology. No midline shift or mass effect. Normal appearance of brain parenchyma. No intracranial hemorrhage, mass lesion or evidence of acute infarction. No extra-axial fluid collections. Vascular: No abnormalities Skull: Intact Sinuses/Orbits: Clear. 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 Chronic increased attenuation of the RIGHT optic globe again noted. IMPRESSION: No acute intracranial mallet. Electronically Signed   By: Lavonia Dana M.D.   On: 11/28/2018 19:58    Procedures Procedures (including critical care time)  Medications Ordered in ED Medications  sodium polystyrene (KAYEXALATE) 15 GM/60ML suspension 15 g (15 g Oral Given 11/28/18 1921)     Initial Impression / Assessment and Plan / ED Course  I have reviewed the triage vital signs and the nursing notes.  Pertinent labs & imaging results that were available during my care of the patient were reviewed by me and considered in my medical decision making (see chart for details).        Labs and imaging reviewed. Pt has an elevation in her potassium at 5.7, but no ekg changes.  She was given kayexalate here and will proceed to dialysis in the am.  Prn f/u anticipated.  Pt understands plan and will not miss dialysis tomorrow.   Pt seen by Dr. Lacinda Axon during this visit.   Final Clinical Impressions(s) / ED Diagnoses   Final diagnoses:  Tremor  Hyperkalemia    ED Discharge Orders    None       Landis Martins 11/28/18 Alden Benjamin, MD 11/28/18 2135

## 2018-11-28 NOTE — ED Notes (Signed)
Patient transported to CT 

## 2018-11-28 NOTE — Discharge Instructions (Addendum)
As discussed, the kayexalate given here should help lower your potassium some tonight but you do need to proceed to dialysis in the morning.  Do not miss your appointment for this.

## 2018-11-28 NOTE — ED Triage Notes (Signed)
Patient reports "jerking" and tremors that started today, states she feels like she needs Dialysis. Missed her last appt because of surgery on her L wrist.

## 2018-11-29 ENCOUNTER — Emergency Department (HOSPITAL_COMMUNITY): Payer: Medicaid Other

## 2018-11-29 ENCOUNTER — Encounter (HOSPITAL_COMMUNITY): Payer: Self-pay | Admitting: Emergency Medicine

## 2018-11-29 ENCOUNTER — Encounter (HOSPITAL_COMMUNITY): Payer: Self-pay | Admitting: *Deleted

## 2018-11-29 ENCOUNTER — Emergency Department (HOSPITAL_COMMUNITY)
Admission: EM | Admit: 2018-11-29 | Discharge: 2018-11-29 | Disposition: A | Discharge status: Home / Self Care | Payer: Medicaid Other | Source: Home / Self Care | Attending: Emergency Medicine | Admitting: Emergency Medicine

## 2018-11-29 ENCOUNTER — Inpatient Hospital Stay (HOSPITAL_COMMUNITY)
Admission: EM | Admit: 2018-11-29 | Discharge: 2018-12-08 | DRG: 291 | Disposition: A | Discharge status: Skilled Nursing Facility | Payer: Medicaid Other | Attending: Pulmonary Disease | Admitting: Pulmonary Disease

## 2018-11-29 ENCOUNTER — Other Ambulatory Visit: Payer: Self-pay

## 2018-11-29 DIAGNOSIS — Z992 Dependence on renal dialysis: Secondary | ICD-10-CM

## 2018-11-29 DIAGNOSIS — F410 Panic disorder [episodic paroxysmal anxiety] without agoraphobia: Secondary | ICD-10-CM | POA: Diagnosis present

## 2018-11-29 DIAGNOSIS — M1990 Unspecified osteoarthritis, unspecified site: Secondary | ICD-10-CM | POA: Diagnosis present

## 2018-11-29 DIAGNOSIS — I509 Heart failure, unspecified: Secondary | ICD-10-CM | POA: Diagnosis present

## 2018-11-29 DIAGNOSIS — Z79899 Other long term (current) drug therapy: Secondary | ICD-10-CM

## 2018-11-29 DIAGNOSIS — E8889 Other specified metabolic disorders: Secondary | ICD-10-CM | POA: Diagnosis present

## 2018-11-29 DIAGNOSIS — G9341 Metabolic encephalopathy: Secondary | ICD-10-CM | POA: Diagnosis not present

## 2018-11-29 DIAGNOSIS — Y92003 Bedroom of unspecified non-institutional (private) residence as the place of occurrence of the external cause: Secondary | ICD-10-CM | POA: Insufficient documentation

## 2018-11-29 DIAGNOSIS — G4733 Obstructive sleep apnea (adult) (pediatric): Secondary | ICD-10-CM | POA: Diagnosis present

## 2018-11-29 DIAGNOSIS — J449 Chronic obstructive pulmonary disease, unspecified: Secondary | ICD-10-CM

## 2018-11-29 DIAGNOSIS — H409 Unspecified glaucoma: Secondary | ICD-10-CM | POA: Diagnosis present

## 2018-11-29 DIAGNOSIS — S82851A Displaced trimalleolar fracture of right lower leg, initial encounter for closed fracture: Secondary | ICD-10-CM | POA: Diagnosis present

## 2018-11-29 DIAGNOSIS — Z9115 Patient's noncompliance with renal dialysis: Secondary | ICD-10-CM

## 2018-11-29 DIAGNOSIS — E1122 Type 2 diabetes mellitus with diabetic chronic kidney disease: Secondary | ICD-10-CM | POA: Diagnosis present

## 2018-11-29 DIAGNOSIS — W06XXXA Fall from bed, initial encounter: Secondary | ICD-10-CM | POA: Insufficient documentation

## 2018-11-29 DIAGNOSIS — Y9389 Activity, other specified: Secondary | ICD-10-CM

## 2018-11-29 DIAGNOSIS — I132 Hypertensive heart and chronic kidney disease with heart failure and with stage 5 chronic kidney disease, or end stage renal disease: Secondary | ICD-10-CM

## 2018-11-29 DIAGNOSIS — S0990XA Unspecified injury of head, initial encounter: Secondary | ICD-10-CM | POA: Insufficient documentation

## 2018-11-29 DIAGNOSIS — F319 Bipolar disorder, unspecified: Secondary | ICD-10-CM

## 2018-11-29 DIAGNOSIS — D631 Anemia in chronic kidney disease: Secondary | ICD-10-CM | POA: Diagnosis present

## 2018-11-29 DIAGNOSIS — Y92009 Unspecified place in unspecified non-institutional (private) residence as the place of occurrence of the external cause: Secondary | ICD-10-CM

## 2018-11-29 DIAGNOSIS — E875 Hyperkalemia: Secondary | ICD-10-CM

## 2018-11-29 DIAGNOSIS — K219 Gastro-esophageal reflux disease without esophagitis: Secondary | ICD-10-CM | POA: Diagnosis present

## 2018-11-29 DIAGNOSIS — I952 Hypotension due to drugs: Secondary | ICD-10-CM | POA: Diagnosis not present

## 2018-11-29 DIAGNOSIS — Z87891 Personal history of nicotine dependence: Secondary | ICD-10-CM | POA: Insufficient documentation

## 2018-11-29 DIAGNOSIS — N19 Unspecified kidney failure: Secondary | ICD-10-CM

## 2018-11-29 DIAGNOSIS — Z794 Long term (current) use of insulin: Secondary | ICD-10-CM

## 2018-11-29 DIAGNOSIS — G8929 Other chronic pain: Secondary | ICD-10-CM | POA: Diagnosis present

## 2018-11-29 DIAGNOSIS — N2581 Secondary hyperparathyroidism of renal origin: Secondary | ICD-10-CM | POA: Diagnosis present

## 2018-11-29 DIAGNOSIS — N186 End stage renal disease: Secondary | ICD-10-CM

## 2018-11-29 DIAGNOSIS — T3995XA Adverse effect of unspecified nonopioid analgesic, antipyretic and antirheumatic, initial encounter: Secondary | ICD-10-CM | POA: Diagnosis not present

## 2018-11-29 DIAGNOSIS — I10 Essential (primary) hypertension: Secondary | ICD-10-CM | POA: Diagnosis present

## 2018-11-29 DIAGNOSIS — Z9001 Acquired absence of eye: Secondary | ICD-10-CM

## 2018-11-29 DIAGNOSIS — H5462 Unqualified visual loss, left eye, normal vision right eye: Secondary | ICD-10-CM | POA: Diagnosis present

## 2018-11-29 DIAGNOSIS — Z885 Allergy status to narcotic agent status: Secondary | ICD-10-CM

## 2018-11-29 DIAGNOSIS — Z9049 Acquired absence of other specified parts of digestive tract: Secondary | ICD-10-CM | POA: Insufficient documentation

## 2018-11-29 DIAGNOSIS — M25571 Pain in right ankle and joints of right foot: Secondary | ICD-10-CM

## 2018-11-29 DIAGNOSIS — E119 Type 2 diabetes mellitus without complications: Secondary | ICD-10-CM

## 2018-11-29 DIAGNOSIS — G2581 Restless legs syndrome: Secondary | ICD-10-CM | POA: Diagnosis present

## 2018-11-29 DIAGNOSIS — Z9114 Patient's other noncompliance with medication regimen: Secondary | ICD-10-CM

## 2018-11-29 DIAGNOSIS — Z1159 Encounter for screening for other viral diseases: Secondary | ICD-10-CM

## 2018-11-29 DIAGNOSIS — Y998 Other external cause status: Secondary | ICD-10-CM

## 2018-11-29 DIAGNOSIS — W19XXXA Unspecified fall, initial encounter: Secondary | ICD-10-CM

## 2018-11-29 DIAGNOSIS — G4730 Sleep apnea, unspecified: Secondary | ICD-10-CM | POA: Diagnosis present

## 2018-11-29 DIAGNOSIS — E11319 Type 2 diabetes mellitus with unspecified diabetic retinopathy without macular edema: Secondary | ICD-10-CM | POA: Diagnosis present

## 2018-11-29 DIAGNOSIS — G253 Myoclonus: Secondary | ICD-10-CM | POA: Diagnosis present

## 2018-11-29 DIAGNOSIS — Z6841 Body Mass Index (BMI) 40.0 and over, adult: Secondary | ICD-10-CM

## 2018-11-29 DIAGNOSIS — M545 Low back pain: Secondary | ICD-10-CM | POA: Diagnosis present

## 2018-11-29 DIAGNOSIS — K5900 Constipation, unspecified: Secondary | ICD-10-CM | POA: Diagnosis present

## 2018-11-29 DIAGNOSIS — Z9109 Other allergy status, other than to drugs and biological substances: Secondary | ICD-10-CM

## 2018-11-29 LAB — BASIC METABOLIC PANEL
Anion gap: 16 — ABNORMAL HIGH (ref 5–15)
BUN: 112 mg/dL — ABNORMAL HIGH (ref 6–20)
CO2: 20 mmol/L — ABNORMAL LOW (ref 22–32)
Calcium: 9.3 mg/dL (ref 8.9–10.3)
Chloride: 105 mmol/L (ref 98–111)
Creatinine, Ser: 11.16 mg/dL — ABNORMAL HIGH (ref 0.44–1.00)
GFR calc Af Amer: 4 mL/min — ABNORMAL LOW (ref >60)
GFR calc non Af Amer: 4 mL/min — ABNORMAL LOW (ref >60)
Glucose, Bld: 225 mg/dL — ABNORMAL HIGH (ref 70–99)
Potassium: 5.8 mmol/L — ABNORMAL HIGH (ref 3.5–5.1)
Sodium: 141 mmol/L (ref 135–145)

## 2018-11-29 LAB — CBG MONITORING, ED: Glucose-Capillary: 299 mg/dL — ABNORMAL HIGH (ref 70–99)

## 2018-11-29 LAB — CBC WITH DIFFERENTIAL/PLATELET
Abs Immature Granulocytes: 0.02 10*3/uL (ref 0.00–0.07)
Basophils Absolute: 0 10*3/uL (ref 0.0–0.1)
Basophils Relative: 0 %
Eosinophils Absolute: 0.1 10*3/uL (ref 0.0–0.5)
Eosinophils Relative: 1 %
HCT: 28.9 % — ABNORMAL LOW (ref 36.0–46.0)
Hemoglobin: 8.5 g/dL — ABNORMAL LOW (ref 12.0–15.0)
Immature Granulocytes: 0 %
Lymphocytes Relative: 14 %
Lymphs Abs: 0.9 10*3/uL (ref 0.7–4.0)
MCH: 28.1 pg (ref 26.0–34.0)
MCHC: 29.4 g/dL — ABNORMAL LOW (ref 30.0–36.0)
MCV: 95.4 fL (ref 80.0–100.0)
Monocytes Absolute: 0.3 10*3/uL (ref 0.1–1.0)
Monocytes Relative: 4 %
Neutro Abs: 5.1 10*3/uL (ref 1.7–7.7)
Neutrophils Relative %: 81 %
Platelets: 144 10*3/uL — ABNORMAL LOW (ref 150–400)
RBC: 3.03 MIL/uL — ABNORMAL LOW (ref 3.87–5.11)
RDW: 16.8 % — ABNORMAL HIGH (ref 11.5–15.5)
WBC: 6.3 10*3/uL (ref 4.0–10.5)
nRBC: 0 % (ref 0.0–0.2)

## 2018-11-29 LAB — COMPREHENSIVE METABOLIC PANEL
ALT: 5 U/L (ref 0–44)
AST: 14 U/L — ABNORMAL LOW (ref 15–41)
Albumin: 3.3 g/dL — ABNORMAL LOW (ref 3.5–5.0)
Alkaline Phosphatase: 93 U/L (ref 38–126)
Anion gap: 16 — ABNORMAL HIGH (ref 5–15)
BUN: 114 mg/dL — ABNORMAL HIGH (ref 6–20)
CO2: 20 mmol/L — ABNORMAL LOW (ref 22–32)
Calcium: 9.1 mg/dL (ref 8.9–10.3)
Chloride: 104 mmol/L (ref 98–111)
Creatinine, Ser: 11.26 mg/dL — ABNORMAL HIGH (ref 0.44–1.00)
GFR calc Af Amer: 4 mL/min — ABNORMAL LOW (ref >60)
GFR calc non Af Amer: 4 mL/min — ABNORMAL LOW (ref >60)
Glucose, Bld: 318 mg/dL — ABNORMAL HIGH (ref 70–99)
Potassium: 6.3 mmol/L (ref 3.5–5.1)
Sodium: 140 mmol/L (ref 135–145)
Total Bilirubin: 0.4 mg/dL (ref 0.3–1.2)
Total Protein: 7.4 g/dL (ref 6.5–8.1)

## 2018-11-29 LAB — MAGNESIUM: Magnesium: 2 mg/dL (ref 1.7–2.4)

## 2018-11-29 LAB — GLUCOSE, CAPILLARY: Glucose-Capillary: 227 mg/dL — ABNORMAL HIGH (ref 70–99)

## 2018-11-29 MED ORDER — SODIUM ZIRCONIUM CYCLOSILICATE 10 G PO PACK
10.0000 g | PACK | Freq: Once | ORAL | Status: AC
  Administered 2018-11-29: 10 g via ORAL
  Filled 2018-11-29: qty 1

## 2018-11-29 MED ORDER — ONDANSETRON HCL 4 MG PO TABS
4.0000 mg | ORAL_TABLET | Freq: Four times a day (QID) | ORAL | Status: DC | PRN
  Administered 2018-12-01: 4 mg via ORAL
  Filled 2018-11-29: qty 1

## 2018-11-29 MED ORDER — ATORVASTATIN CALCIUM 20 MG PO TABS
20.0000 mg | ORAL_TABLET | Freq: Every day | ORAL | Status: DC
  Administered 2018-11-30 – 2018-12-08 (×9): 20 mg via ORAL
  Filled 2018-11-29 (×9): qty 1

## 2018-11-29 MED ORDER — INSULIN ASPART 100 UNIT/ML ~~LOC~~ SOLN
5.0000 [IU] | Freq: Once | SUBCUTANEOUS | Status: AC
  Administered 2018-11-29: 23:00:00 5 [IU] via SUBCUTANEOUS

## 2018-11-29 MED ORDER — ONDANSETRON HCL 4 MG/2ML IJ SOLN
4.0000 mg | Freq: Once | INTRAMUSCULAR | Status: AC
  Administered 2018-11-29: 4 mg via INTRAVENOUS
  Filled 2018-11-29: qty 2

## 2018-11-29 MED ORDER — PANTOPRAZOLE SODIUM 40 MG PO TBEC
40.0000 mg | DELAYED_RELEASE_TABLET | Freq: Every day | ORAL | Status: DC
  Administered 2018-11-30 – 2018-12-08 (×9): 40 mg via ORAL
  Filled 2018-11-29 (×9): qty 1

## 2018-11-29 MED ORDER — TRAMADOL HCL 50 MG PO TABS
50.0000 mg | ORAL_TABLET | Freq: Two times a day (BID) | ORAL | Status: DC | PRN
  Administered 2018-11-30: 08:00:00 50 mg via ORAL
  Filled 2018-11-29: qty 1

## 2018-11-29 MED ORDER — CINACALCET HCL 30 MG PO TABS
30.0000 mg | ORAL_TABLET | Freq: Every day | ORAL | Status: DC
  Administered 2018-11-30 – 2018-12-05 (×6): 30 mg via ORAL
  Filled 2018-11-29 (×7): qty 1

## 2018-11-29 MED ORDER — ACETAMINOPHEN 325 MG PO TABS: 650.0000 mg | ORAL_TABLET | Freq: Four times a day (QID) | ORAL | Status: DC | PRN

## 2018-11-29 MED ORDER — ALBUTEROL SULFATE (2.5 MG/3ML) 0.083% IN NEBU
2.5000 mg | INHALATION_SOLUTION | Freq: Four times a day (QID) | RESPIRATORY_TRACT | Status: DC | PRN
  Administered 2018-11-30 – 2018-12-01 (×2): 2.5 mg via RESPIRATORY_TRACT
  Filled 2018-11-29 (×2): qty 3

## 2018-11-29 MED ORDER — OXYCODONE-ACETAMINOPHEN 5-325 MG PO TABS: 1.0000 | ORAL_TABLET | ORAL | 0 refills | Status: DC | PRN

## 2018-11-29 MED ORDER — SODIUM BICARBONATE 8.4 % IV SOLN
50.0000 meq | Freq: Once | INTRAVENOUS | Status: AC
  Administered 2018-11-29: 23:00:00 50 meq via INTRAVENOUS
  Filled 2018-11-29: qty 50

## 2018-11-29 MED ORDER — MORPHINE SULFATE (PF) 4 MG/ML IV SOLN
4.0000 mg | Freq: Once | INTRAVENOUS | Status: AC
  Administered 2018-11-29: 4 mg via INTRAVENOUS
  Filled 2018-11-29: qty 1

## 2018-11-29 MED ORDER — ACETAMINOPHEN 650 MG RE SUPP: 650.0000 mg | Freq: Four times a day (QID) | RECTAL | Status: DC | PRN

## 2018-11-29 MED ORDER — LORATADINE 10 MG PO TABS
10.0000 mg | ORAL_TABLET | Freq: Every day | ORAL | Status: DC
  Administered 2018-11-30 – 2018-12-08 (×9): 10 mg via ORAL
  Filled 2018-11-29 (×9): qty 1

## 2018-11-29 MED ORDER — MORPHINE SULFATE (PF) 4 MG/ML IV SOLN
4.0000 mg | Freq: Once | INTRAVENOUS | Status: AC
  Administered 2018-11-29: 22:00:00 4 mg via INTRAVENOUS
  Filled 2018-11-29: qty 1

## 2018-11-29 MED ORDER — INSULIN ASPART 100 UNIT/ML ~~LOC~~ SOLN
0.0000 [IU] | Freq: Three times a day (TID) | SUBCUTANEOUS | Status: DC
  Administered 2018-11-30: 12:00:00 2 [IU] via SUBCUTANEOUS
  Administered 2018-11-30: 08:00:00 1 [IU] via SUBCUTANEOUS
  Administered 2018-11-30 – 2018-12-01 (×2): 3 [IU] via SUBCUTANEOUS
  Administered 2018-12-01 (×2): 2 [IU] via SUBCUTANEOUS
  Administered 2018-12-02: 1 [IU] via SUBCUTANEOUS
  Administered 2018-12-02: 3 [IU] via SUBCUTANEOUS
  Administered 2018-12-02 – 2018-12-03 (×2): 2 [IU] via SUBCUTANEOUS
  Administered 2018-12-03 – 2018-12-04 (×4): 1 [IU] via SUBCUTANEOUS
  Administered 2018-12-04 – 2018-12-06 (×2): 2 [IU] via SUBCUTANEOUS
  Administered 2018-12-07 (×3): 1 [IU] via SUBCUTANEOUS
  Administered 2018-12-08: 2 [IU] via SUBCUTANEOUS
  Administered 2018-12-08: 1 [IU] via SUBCUTANEOUS

## 2018-11-29 MED ORDER — CALCIUM CARBONATE ANTACID 750 MG PO CHEW: 2.0000 | CHEWABLE_TABLET | ORAL | Status: DC

## 2018-11-29 MED ORDER — LOSARTAN POTASSIUM 50 MG PO TABS: 25.0000 mg | ORAL_TABLET | Freq: Every day | ORAL | Status: DC

## 2018-11-29 MED ORDER — INSULIN GLARGINE 100 UNIT/ML ~~LOC~~ SOLN
10.0000 [IU] | SUBCUTANEOUS | Status: DC
  Administered 2018-11-30 – 2018-12-01 (×2): 10 [IU] via SUBCUTANEOUS
  Filled 2018-11-29 (×3): qty 0.1

## 2018-11-29 MED ORDER — ONDANSETRON HCL 4 MG/2ML IJ SOLN
4.0000 mg | Freq: Four times a day (QID) | INTRAMUSCULAR | Status: DC | PRN
  Administered 2018-12-02 (×3): 4 mg via INTRAVENOUS
  Filled 2018-11-29 (×3): qty 2

## 2018-11-29 MED ORDER — TORSEMIDE 20 MG PO TABS: 100.0000 mg | ORAL_TABLET | Freq: Two times a day (BID) | ORAL | Status: DC

## 2018-11-29 MED ORDER — SODIUM ZIRCONIUM CYCLOSILICATE 5 G PO PACK
10.0000 g | PACK | ORAL | Status: AC
  Administered 2018-11-29: 22:00:00 10 g via ORAL
  Filled 2018-11-29 (×2): qty 2

## 2018-11-29 MED ORDER — POLYETHYLENE GLYCOL 3350 17 G PO PACK
17.0000 g | PACK | Freq: Every day | ORAL | Status: DC | PRN
  Administered 2018-12-01 – 2018-12-02 (×2): 17 g via ORAL
  Filled 2018-11-29 (×2): qty 1

## 2018-11-29 MED ORDER — CALCIUM GLUCONATE-NACL 1-0.675 GM/50ML-% IV SOLN
1.0000 g | Freq: Once | INTRAVENOUS | Status: AC
  Administered 2018-11-29: 1000 mg via INTRAVENOUS
  Filled 2018-11-29: qty 50

## 2018-11-29 NOTE — ED Notes (Signed)
CRITICAL VALUE ALERT  Critical Value: Potassium 6.3 Date & Time Notied:  11/29/18 @ 0350 Provider Notified:Dr Sabra Heck Orders Received/Actions taken:None yet

## 2018-11-29 NOTE — ED Triage Notes (Signed)
PT. BIB EMS after fall on right side after tying to get out of bed. Per. EMS pt. Found laying on floor.  Pt. Has pain of right ankle with little movement and no sensation. Pain 9 on scale 0-10. Pt. Also complains of head pain. Pt. A&O x4.   Pt. Was seen at [facility] yesterday c/o jerking movement.  Pt. Dialysis MWF.pt. missed dialysis last Friday. Pt. bilaterally legally blind.  Pt. Carpal Tunnel surgery to left arm.

## 2018-11-29 NOTE — H&P (Addendum)
History and Physical    Martha [name]:[id number] DOB: 03/19/1966 DOA: 11/29/2018  PCP: Sinda Du, MD   Patient coming from: Home  I have personally briefly reviewed patient's old medical records in [place]  Chief Complaint: Missed HD, confusion, ankle fracture  HPI: Martha [name] is a 53 y.o. female with medical history significant for ESRD, morbid obesity, DM, hypertension, obstructive sleep apnea, bipolar disorder who was brought to the ED this evening with reports of confusion and jerking of her upper extremities.  She reports of jerking of her extremities starts when she has missed some sessions of HD.  She is unable to tell me when jerking started. Patient was in the ED earlier this morning after she fell, subsequently sustaining a right ankle fracture.  Head an cervical CT negative for acute abnormalities. Orthopedics was consulted patient was discharged to home after splint was done by Orthopedic physician assistant, patient was to follow-up with Ortho on Wednesday and to discuss plans for surgery.  Patient last HD was Wednesday 4/29-5 days ago.  Patient had carpal tunnel surgery of her left upper extremity, the next day Thursday, because of pain she was unable to go for her HD on Friday.  The fall and subsequent fracture to her right ankle this morning patient was unable to go for HD today.  She tells me she has some difficulty breathing, but no cough.  She is intermittently confused and responds to some questions appropriately.  Recent hospitalization 3/1 to 09/29/2018-also similar presentation with altered mental status after patient had missed HD sessions, proved to baseline after HD, also some of her psychoactive medications were adjusted or discontinued  ED Course: Heart rate 80s, respiratory rate 15-up to 31 recorded.  O2 sats usually recorded as 84%, but up to 94% on room air.  At the time of my evaluation patient's O2 sats on room air and 94 to 96%, without  tachypnea.  Blood pressure systolic 500X to 381W.  Potassium 6.3, BUN 114. EDP talked to nephrologist on-call who recommended admission here for HD in a.m. and give 1 dose of Lokelma.  Hospitalist to admit for metabolic encephalopathy 2/2 Uremia.   Review of Systems: As per HPI all other systems reviewed and negative.  Past Medical History:  Diagnosis Date   Acid reflux    takes Tums   Anemia    Arthritis    Bipolar 1 disorder (HCC)    Blindness of left eye    Carpal tunnel syndrome, bilateral    Cervical radiculopathy    CHF (congestive heart failure) (HCC)    Chronic kidney disease    Stage 5- 01/25/17   Constipation    COPD (chronic obstructive pulmonary disease) ([place]) 2014   bronchitis   Degenerative disc disease, thoracic    Depression    Diabetes mellitus    Type II   Diabetic retinopathy (HCC)    Dyspnea    when walking   End stage kidney disease ([place])    M, W, F [place]    Hypertension    Noncompliance with medication regimen    Noncompliance with medication regimen    Obesity (BMI 30-39.9)    OSA (obstructive sleep apnea)    cpap   Panic attack    RLS (restless legs syndrome)     Past Surgical History:  Procedure Laterality Date   AV FISTULA PLACEMENT Right 01/27/2017   Procedure: ARTERIOVENOUS (AV) FISTULA CREATION-RIGHT ARM;  Surgeon: Elam Dutch, MD;  [facility]  OR;  Service: Vascular;  Laterality: Right;   AV FISTULA PLACEMENT Left 08/03/2018   Procedure: LEFT ARTERIOVENOUS (AV) FISTULA CREATION;  Surgeon: Elam Dutch, MD;  Location: [place];  Service: Vascular;  Laterality: Left;   BIOPSY N/A 05/17/2013   Procedure: BIOPSY;  Surgeon: [name], MD;  Location: [facility];  Service: Endoscopy;  Laterality: N/A;   CARPAL TUNNEL RELEASE Left 11/25/2018   Procedure: LEFT CARPAL TUNNEL RELEASE;  Surgeon: Charlotte Crumb, MD;  Location: [place];  Service: Orthopedics;  Laterality: Left;   CHOLECYSTECTOMY N/A 04/27/2014    Procedure: LAPAROSCOPIC CHOLECYSTECTOMY;  Surgeon: [name], MD;  Location: [facility];  Service: General;  Laterality: N/A;   COLONOSCOPY  06/06/2010   HYI:FOYDXA bleeding secondary to internal hemorrhoids but incomplete evaluation secondary to poor right colon prep/small rectal and sigmoid colon polyps (hyperplastic). PROPOFOL   COLONOSCOPY  May 2013   Dr. Gilliam/[facility]: 5 mm a descending colon polyp, hyperplastic. Adequate bowel prep.   ENDOMETRIAL ABLATION     ENUCLEATION Left 02/04/2018   ENUCLEATION WITH PLACEMENT OF IMPLANT LEFT EYE   ENUCLEATION Left 02/04/2018   Procedure: ENUCLEATION WITH PLACEMENT OF IMPLANT LEFT EYE;  Surgeon: Clista Bernhardt, MD;  Location: [place];  Service: Ophthalmology;  Laterality: Left;   ESOPHAGOGASTRODUODENOSCOPY  06/06/2010   JOI:NOMVEHM erythema and edema of body of stomach, with sessile polypoid lesions. bx benign. no h.pylori   ESOPHAGOGASTRODUODENOSCOPY (EGD) WITH PROPOFOL N/A 05/17/2013   Dr. Oneida Alar: normal esophagus, moderate nodular gastritis, negative path, empiric Savary dilation   EYE SURGERY  07/2017   sx for glaucoma   FLEXIBLE SIGMOIDOSCOPY N/A 05/17/2013   Dr. Oneida Alar: moderate sized internal hemorrhoids   HEMORRHOID BANDING N/A 05/17/2013   Procedure: HEMORRHOID BANDING;  Surgeon: [name], MD;  Location: [facility];  Service: Endoscopy;  Laterality: N/A;  2 bands placed   INCISION AND DRAINAGE ABSCESS Left 10/11/2013   Procedure: INCISION AND DRAINAGE AND DEBRIDEMENT LEFT BREAST  ABSCESS;  Surgeon: [name], MD;  Location: [facility];  Service: General;  Laterality: Left;   INSERTION OF DIALYSIS CATHETER N/A 06/08/2018   Procedure: INSERTION OF Right internal jugular TUNNELED  DIALYSIS CATHETER;  Surgeon: Angelia Mould, MD;  Location: [place];  Service: Vascular;  Laterality: N/A;   [place] Right 06/01/2013   Procedure: INCISION AND DRAINAGE AND DEBRIDEMENT ABSCESS RIGHT BREAST;   Surgeon: [name], MD;  Location: [facility];  Service: General;  Laterality: Right;   LEFT EYE REMOVED Left 01/2018   [facility] [address].   LIGATION OF ARTERIOVENOUS  FISTULA Right 06/08/2018   Procedure: LIGATION OF ARTERIOVENOUS  FISTULA RIGHT ARM;  Surgeon: Angelia Mould, MD;  Location: [place];  Service: Vascular;  Laterality: Right;   SAVORY DILATION N/A 05/17/2013   Procedure: SAVORY DILATION;  Surgeon: [name], MD;  Location: [facility];  Service: Endoscopy;  Laterality: N/A;  14/15/16   SKIN FULL THICKNESS GRAFT Left 05/05/2018   Procedure: ABDOMINAL DERMIS FAT SKIN GRAFT FULL THICKNESS LEFT EYE;  Surgeon: Clista Bernhardt, MD;  Location: [place];  Service: Ophthalmology;  Laterality: Left;   TUBAL LIGATION       reports that she quit smoking about 11 years ago. Her smoking use included cigarettes. She has a 37.50 pack-year smoking history. She has never used smokeless tobacco. She reports that she does not drink alcohol or use drugs.  Allergies  Allergen Reactions   Codeine Nausea And  Vomiting   Tape Rash and Other (See Comments)    Pull skin off.  Paper tape is ok    Family History  Adopted: Yes  Family history unknown: Yes    Prior to Admission medications   Medication Sig Start Date End Date Taking? Authorizing Provider  albuterol (PROVENTIL) (2.5 MG/3ML) 0.083% nebulizer solution Take 2.5 mg by nebulization every 6 (six) hours as needed for wheezing or shortness of breath.    Yes [provider]  ALPRAZolam Duanne Moron) 1 MG tablet Take 0.5 tablets (0.5 mg total) by mouth 2 (two) times daily as needed for anxiety (nerves). Patient taking differently: Take 0.5 mg by mouth 4 (four) times daily.  09/29/18  Yes Johnson, Clanford L, MD  atorvastatin (LIPITOR) 20 MG tablet Take 20 mg by mouth daily.    Yes [provider]  busPIRone (BUSPAR) 7.5 MG tablet Take 7.5 mg by mouth 2 (two) times daily.   Yes [provider]  calcium carbonate (TUMS EX) 750 MG chewable tablet Chew 2-4 tablets by mouth See admin instructions. Patient takes 4 tablets with meals and 2 tablet with snacks three times a day   Yes [provider]  cetirizine (ZYRTEC) 10 MG chewable tablet Chew 10 mg by mouth daily.   Yes [provider]  cinacalcet (SENSIPAR) 30 MG tablet Take 30 mg by mouth daily.   Yes [provider]  colchicine 0.6 MG tablet Take 1 tablet (0.6 mg total) by mouth every Tuesday, Thursday, and Saturday at 6 PM. Take 1 tablet every other day 09/30/18  Yes Johnson, Clanford L, MD  gabapentin (NEURONTIN) 300 MG capsule Take 300 mg by mouth at bedtime.   Yes [provider]  HYDROcodone-acetaminophen (NORCO) 10-325 MG tablet Take 1 tablet by mouth every 6 (six) hours as needed.   Yes [provider]  Insulin Glargine (LANTUS SOLOSTAR) 100 UNIT/ML Solostar Pen Inject 30 Units into the skin every morning. And pen needles 1/day 06/29/18  Yes Renato Shin, MD  lidocaine-prilocaine (EMLA) cream Apply 1 application topically as needed (topical anesthesia for hemodialysis if Gebauers and Lidocaine injection are ineffective.). 04/25/17  Yes Johnson, Clanford L, MD  losartan (COZAAR) 25 MG tablet Take 25 mg by mouth daily.   Yes [provider]  omeprazole (PRILOSEC) 40 MG capsule Take 40 mg by mouth daily.   Yes [provider]  oxyCODONE-acetaminophen (PERCOCET/ROXICET) 5-325 MG tablet Take 1 tablet by mouth every 4 (four) hours as needed for severe pain. 11/29/18  Yes Tegeler, Gwenyth Allegra, MD  PARoxetine (PAXIL) 20 MG tablet Take 1 daily. This is to prevent panic attacks Patient taking differently: Take 20 mg by mouth daily. This is to prevent panic attacks 10/28/16  Yes Sinda Du, MD  torsemide (DEMADEX) 100 MG tablet Take 100 mg by mouth 2 (two) times daily.    Yes [provider]  oxyCODONE-acetaminophen (PERCOCET) 5-325 MG tablet Take 1 tablet by  mouth every 4 (four) hours as needed for severe pain. Patient not taking: Reported on 11/29/2018 11/25/18 11/25/19  Charlotte Crumb, MD    Physical Exam: Vitals:   11/29/18 1900 11/29/18 2000 11/29/18 2030 11/29/18 2100  BP: (!) 148/72 (!) 171/80 (!) 172/88 (!) 145/84  Pulse: 79 81 82 81  Resp: 13 15 (!) 23 (!) 31  SpO2: (!) 89% (!) 84% 92% 94%  Weight:      Height:        Constitutional: comfortable but drowsy, would intermittently become awake and alert  when talking, intermittently confused as evidenced by her response to my questions. Vitals:   11/29/18 1900 11/29/18 2000 11/29/18 2030 11/29/18 2100  BP: (!) 148/72 (!) 171/80 (!) 172/88 (!) 145/84  Pulse: 79 81 82 81  Resp: 13 15 (!) 23 (!) 31  SpO2: (!) 89% (!) 84% 92% 94%  Weight:      Height:       Eyes: Left eye enucleation ENMT: Mucous membranes are moist. Posterior pharynx clear of any exudate or lesions. Neck: normal, supple, no masses, no thyromegaly Respiratory: Body habitus dampens auscultatory findings but equal breath sounds, no added sounds appreciated, normal respiratory effort. No accessory muscle use.  Cardiovascular: Regular rate and rhythm, no murmurs / rubs / gallops. No extremity edema. 2+ pedal pulses.  Abdomen: Obese, no tenderness, no masses palpated. No hepatosplenomegaly. Bowel sounds positive.  Musculoskeletal: Clean dressing to left wrist area, also splint to right ankle. -Motion limited by pain and splinting.  No clubbing / cyanosis. Normal muscle tone.  Intermittent jerking of bilateral upper extremity lasting few seconds. Skin: Erythema and tenderness to left anterior lateral surface of leg patient said from fall today and subsequent bruising, no rashes, lesions, ulcers. No induration Neurologic: CN 2-12 grossly intact.  Moving extremities spontaneously except for left forearm and right ankle. Psychiatric: Drowsy, intermittently confused otherwise alert and oriented x 3. Normal mood.   Labs on  Admission: I have personally reviewed following labs and imaging studies  CBC: Recent Labs  Lab 11/25/18 1117 11/28/18 1736 11/29/18 1936  WBC  --  5.2 6.3  NEUTROABS  --  3.6 5.1  HGB 9.9* 8.9* 8.5*  HCT 29.0* 28.6* 28.9*  MCV  --  92.0 95.4  PLT  --  136* 646*   Basic Metabolic Panel: Recent Labs  Lab 11/25/18 1117 11/28/18 1736 11/29/18 1009 11/29/18 1936  NA 138 141 141 140  K 4.3 5.7* 5.8* 6.3*  CL  --  105 105 104  CO2  --  20* 20* 20*  GLUCOSE 246* 314* 225* 318*  BUN  --  108* 112* 114*  CREATININE  --  10.56* 11.16* 11.26*  CALCIUM  --  9.2 9.3 9.1  MG  --   --   --  2.0   Liver Function Tests: Recent Labs  Lab 11/29/18 1936  AST 14*  ALT 5  ALKPHOS 93  BILITOT 0.4  PROT 7.4  ALBUMIN 3.3*   CBG: Recent Labs  Lab 11/25/18 1037 11/25/18 1235 11/25/18 1436 11/28/18 1816 11/29/18 1920  GLUCAP 229* 201* 186* 279* 299*    Radiological Exams on Admission: Dg Ankle Complete Right  Result Date: 11/29/2018 CLINICAL DATA:  Fall this morning with swelling and bruising to the right ankle EXAM: RIGHT ANKLE - COMPLETE 3+ VIEW COMPARISON:  None FINDINGS: Oblique fracture through the distal fibular shaft. There is a transverse fracture through the medial malleolus. Widening of the medial clear space. Posterior malleolus fracture is present and retracted proximally. Located hindfoot.  Heel spurs. IMPRESSION: Trimalleolar fracture with widening of the medial clear space. Electronically Signed   By: Monte Fantasia M.D.   On: 11/29/2018 07:28   Ct Head Wo Contrast  Result Date: 11/29/2018 CLINICAL DATA:  Blunt maxillofacial trauma EXAM: CT HEAD WITHOUT CONTRAST CT CERVICAL SPINE WITHOUT CONTRAST TECHNIQUE: Multidetector CT imaging of the head and cervical spine was performed following the standard protocol without intravenous contrast. Multiplanar CT image reconstructions of the cervical spine were also generated. COMPARISON:  Head CT 11/28/2018.  Cervical spine CT  06/21/2015 FINDINGS: CT HEAD FINDINGS Brain: No evidence of acute infarction, hemorrhage, hydrocephalus, extra-axial collection or mass lesion/mass effect. Vascular: No hyperdense vessel or unexpected calcification. Skull: Negative Sinuses/Orbits: Left nucleation with prosthesis. Right-sided oil tamponade. CT CERVICAL SPINE FINDINGS Alignment: Normal Skull base and vertebrae: Negative for acute fracture Soft tissues and spinal canal: No prevertebral fluid or swelling. No visible canal hematoma. 22 mm left thyroid nodule without worrisome growth since cervical spine CT in 2016. Disc levels:  No notable degenerative changes Upper chest: Negative IMPRESSION: No evidence of acute intracranial or cervical spine injury. Electronically Signed   By: Monte Fantasia M.D.   On: 11/29/2018 07:41   Ct Head Wo Contrast  Result Date: 11/28/2018 CLINICAL DATA:  Jerking and tremors that began today, end-stage renal disease on dialysis, CHF, type II diabetes mellitus, hypertension, COPD EXAM: CT HEAD WITHOUT CONTRAST TECHNIQUE: Contiguous axial images were obtained from the base of the skull through the vertex without intravenous contrast. Sagittal and coronal MPR images reconstructed from axial data set. COMPARISON:  09/26/2018 FINDINGS: Brain: Normal ventricular morphology. No midline shift or mass effect. Normal appearance of brain parenchyma. No intracranial hemorrhage, mass lesion or evidence of acute infarction. No extra-axial fluid collections. Vascular: No abnormalities Skull: Intact Sinuses/Orbits: Clear. 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 Chronic increased attenuation of the RIGHT optic globe again noted. IMPRESSION: No acute intracranial mallet. Electronically Signed   By: Lavonia Dana M.D.   On: 11/28/2018 19:58   Ct Cervical Spine Wo Contrast  Result Date: 11/29/2018 CLINICAL DATA:  Blunt maxillofacial trauma EXAM: CT HEAD WITHOUT CONTRAST CT CERVICAL SPINE  WITHOUT CONTRAST TECHNIQUE: Multidetector CT imaging of the head and cervical spine was performed following the standard protocol without intravenous contrast. Multiplanar CT image reconstructions of the cervical spine were also generated. COMPARISON:  Head CT 11/28/2018.  Cervical spine CT 06/21/2015 FINDINGS: CT HEAD FINDINGS Brain: No evidence of acute infarction, hemorrhage, hydrocephalus, extra-axial collection or mass lesion/mass effect. Vascular: No hyperdense vessel or unexpected calcification. Skull: Negative Sinuses/Orbits: Left nucleation with prosthesis. Right-sided oil tamponade. CT CERVICAL SPINE FINDINGS Alignment: Normal Skull base and vertebrae: Negative for acute fracture Soft tissues and spinal canal: No prevertebral fluid or swelling. No visible canal hematoma. 22 mm left thyroid nodule without worrisome growth since cervical spine CT in 2016. Disc levels:  No notable degenerative changes Upper chest: Negative IMPRESSION: No evidence of acute intracranial or cervical spine injury. Electronically Signed   By: Monte Fantasia M.D.   On: 11/29/2018 07:41   Dg Chest Port 1 View  Result Date: 11/29/2018 CLINICAL DATA:  Shortness of breath, weakness, history hypertension, CHF, COPD, former smoker EXAM: PORTABLE CHEST 1 VIEW COMPARISON:  Portable exam 1939 hours compared to 09/28/2018 FINDINGS: RIGHT jugular line with tip projecting over SVC. Enlargement of cardiac silhouette with pulmonary vascular congestion. Mediastinal contours normal. Lungs clear. No pleural effusion or pneumothorax. Bones unremarkable. IMPRESSION: Enlargement of cardiac silhouette with pulmonary vascular congestion. No acute infiltrate. Electronically Signed   By: Lavonia Dana M.D.   On: 11/29/2018 19:46    EKG: Independently reviewed.  Sinus rhythm, QTC 425.  T wave in V3 only appears slightly increased compared to prior otherwise no significant change from prior EKG  Assessment/Plan Active Problems:   Metabolic  encephalopathy   Metabolic encephalopathy- confusion, intermittent jerking of bilateral upper extremities(?  Myoclonus), likely secondary to uremia from missed HD sessions, also contribution of narcotics, Xanax  and other psychoactive medications Paxil. Last HD 5 days ago.  CT earlier today negative for acute intracranial or cervical spine injury. -Consult nephrology in a.m. for HD -Hold home opioids, narcotics and paroxetine -Tramadol PRN  ESRD with hyperkalemia-potassium 6.3.  Lokelma 10 mg given in ED x1.  EKG without significant changes.  Two-view chest x-ray shows pulmonary vascular congestion, but patient is comfortable, O2 sats are time of my evaluation 94 to 96% on room air.  Blood pressure systolic 382N to 053Z. - administer 1g Calcium carbonate, sodium bicarbonate 1 amp, insulin 5 units. -Please consult nephrology in a.m. - BMP a.m -Continue home torsemide - Cont home cinacalcet, calcium carbonate  Hypertension-blood pressure systolic 767H to 419F. -Continue home losartan, torsemide - Pending per HD  Diabetes-random glucose 318. -Cont home Lantus at reduced dose 10 units QHS - SSI  Right ankle fracture- sustained after fall.  Ankle x-ray shows trimalleolar fracture with widening of the medial clear space.  Splinted in ED earlier today.  Plan was for patient to follow-up with Dr. Lucia Gaskins on Wednesday. -I talked to Dr. Lucia Gaskins, call for orthopedics - I asked if surgery should be done while inpatient, as patient would likely be here for at least 2 days- He recommended Elevating the right ankle, and patient can follow-up with him as an outpatient in his clinic- on Monday or Wednesday pending whenever patient is discharged.  Unless something changes, patient at this time does not need urgent surgery. -Tramadol PRN   DVT prophylaxis: SCDs for now considering ankle fracture and recent fall. Code Status: Full Family Communication: None at bedside Disposition Plan: Per rounding  team Consults called: Please consult nephrology in a.m for urgent HD Admission status: Obs, tele   Bethena Roys MD [facility]  11/29/2018, 10:48 PM

## 2018-11-29 NOTE — Consult Note (Signed)
Reason for Consult:Right ankle fx Referring Physician: C Tegeler  [name] is an 53 y.o. female.  HPI: [name] to [name] yesterday c/o uncontrollable shaking. Her workup did not show anything untoward and she was discharged home. She got up overnight to go to the bathroom and was still having the episodes of shaking. This caused her to fall. She had immediate right ankle pain and could not get up or bear weight. She was brought back to the ED where x-rays showed an ankle fx and orthopedic surgery was consulted. She c/o localized pain to the area.  Past Medical History:  Diagnosis Date  . Acid reflux    takes Tums  . Anemia   . Arthritis   . Bipolar 1 disorder ([place])   . Blindness of left eye   . Carpal tunnel syndrome, bilateral   . Cervical radiculopathy   . CHF (congestive heart failure) ([name])   . Chronic kidney disease    Stage 5- 01/25/17  . Constipation   . COPD (chronic obstructive pulmonary disease) ([name]) 2014   bronchitis  . Degenerative disc disease, thoracic   . Depression   . Diabetes mellitus    Type II  . Diabetic retinopathy ([name])   . Dyspnea    when walking  . End stage kidney disease ([name])    M, [name]  . Hypertension   . Noncompliance with medication regimen   . Noncompliance with medication regimen   . Obesity (BMI 30-39.9)   . OSA (obstructive sleep apnea)    cpap  . Panic attack   . RLS (restless legs syndrome)     Past Surgical History:  Procedure Laterality Date  . AV FISTULA PLACEMENT Right 01/27/2017   Procedure: ARTERIOVENOUS (AV) FISTULA CREATION-RIGHT ARM;  Surgeon: Elam Dutch, MD;  Location: [name];  Service: Vascular;  Laterality: Right;  . AV FISTULA PLACEMENT Left 08/03/2018   Procedure: LEFT ARTERIOVENOUS (AV) FISTULA CREATION;  Surgeon: Elam Dutch, MD;  Location: [name];  Service: Vascular;  Laterality: Left;  . BIOPSY N/A 05/17/2013   Procedure: BIOPSY;  Surgeon: [name], MD;  Location: [facility];   Service: Endoscopy;  Laterality: N/A;  . CARPAL TUNNEL RELEASE Left 11/25/2018   Procedure: LEFT CARPAL TUNNEL RELEASE;  Surgeon: Charlotte Crumb, MD;  Location: [name];  Service: Orthopedics;  Laterality: Left;  . CHOLECYSTECTOMY N/A 04/27/2014   Procedure: LAPAROSCOPIC CHOLECYSTECTOMY;  Surgeon: [name], MD;  Location: [facility];  Service: General;  Laterality: N/A;  . COLONOSCOPY  06/06/2010   EEF:EOFHQR bleeding secondary to internal hemorrhoids but incomplete evaluation secondary to poor right colon prep/small rectal and sigmoid colon polyps (hyperplastic). PROPOFOL  . COLONOSCOPY  May 2013   Dr. Gilliam/[facility]: 5 mm a descending colon polyp, hyperplastic. Adequate bowel prep.  . ENDOMETRIAL ABLATION    . 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 ENUCLEATION Left 02/04/2018   Procedure: ENUCLEATION WITH PLACEMENT OF IMPLANT LEFT EYE;  Surgeon: Clista Bernhardt, MD;  Location: [name];  Service: Ophthalmology;  Laterality: Left;  . ESOPHAGOGASTRODUODENOSCOPY  06/06/2010   FXJ:OITGPQD erythema and edema of body of stomach, with sessile polypoid lesions. bx benign. no h.pylori  . ESOPHAGOGASTRODUODENOSCOPY (EGD) WITH PROPOFOL N/A 05/17/2013   Dr. Oneida Alar: normal esophagus, moderate nodular gastritis, negative path, empiric Savary dilation  . EYE SURGERY  07/2017   sx for glaucoma  . FLEXIBLE SIGMOIDOSCOPY N/A 05/17/2013   Dr.  Fields: moderate sized internal hemorrhoids  . HEMORRHOID BANDING N/A 05/17/2013   Procedure: HEMORRHOID BANDING;  Surgeon: [name], MD;  Location: [facility];  Service: Endoscopy;  Laterality: N/A;  2 bands placed  . INCISION AND DRAINAGE ABSCESS Left 10/11/2013   Procedure: INCISION AND DRAINAGE AND DEBRIDEMENT LEFT BREAST  ABSCESS;  Surgeon: [name], MD;  Location: [facility];  Service: General;  Laterality: Left;  . INSERTION OF DIALYSIS CATHETER N/A 06/08/2018   Procedure: INSERTION OF Right internal jugular  TUNNELED  DIALYSIS CATHETER;  Surgeon: Angelia Mould, MD;  Location: [facility];  Service: Vascular;  Laterality: N/A;  . IRRIGATION AND DEBRIDEMENT ABSCESS Right 06/01/2013   Procedure: INCISION AND DRAINAGE AND DEBRIDEMENT ABSCESS RIGHT BREAST;  Surgeon: [name], MD;  Location: [facility];  Service: General;  Laterality: Right;  . LEFT EYE REMOVED Left 01/2018   [name] on [address].  Marland Kitchen LIGATION OF ARTERIOVENOUS  FISTULA Right 06/08/2018   Procedure: LIGATION OF ARTERIOVENOUS  FISTULA RIGHT ARM;  Surgeon: Angelia Mould, MD;  Location: [name];  Service: Vascular;  Laterality: Right;  . SAVORY DILATION N/A 05/17/2013   Procedure: SAVORY DILATION;  Surgeon: [name], MD;  Location: [facility];  Service: Endoscopy;  Laterality: N/A;  14/15/16  . SKIN FULL THICKNESS GRAFT Left 05/05/2018   Procedure: ABDOMINAL DERMIS FAT SKIN GRAFT FULL THICKNESS LEFT EYE;  Surgeon: Clista Bernhardt, MD;  Location: [name];  Service: Ophthalmology;  Laterality: Left;  . TUBAL LIGATION      Family History  Adopted: Yes  Family history unknown: Yes    Social History:  reports that she quit smoking about 11 years ago. Her smoking use included cigarettes. She has a 37.50 pack-year smoking history. She has never used smokeless tobacco. She reports that she does not drink alcohol or use drugs.  Allergies:  Allergies  Allergen Reactions  . Codeine Nausea And Vomiting  . Tape Rash and Other (See Comments)    Pull skin off.  Paper tape is ok    Medications: I have reviewed the patient's current medications.  Results for orders placed or performed during the [name] encounter of 11/28/18 (from the past 48 hour(s))  CBC with Differential     Status: Abnormal   Collection Time: 11/28/18  5:36 PM  Result Value Ref Range   WBC 5.2 4.0 - 10.5 K/uL   RBC 3.11 (L) 3.87 - 5.11 MIL/uL   Hemoglobin 8.9 (L) 12.0 - 15.0 g/dL   HCT 28.6 (L) 36.0 - 46.0 %   MCV 92.0 80.0 - 100.0 fL    MCH 28.6 26.0 - 34.0 pg   MCHC 31.1 30.0 - 36.0 g/dL   RDW 16.7 (H) 11.5 - 15.5 %   Platelets 136 (L) 150 - 400 K/uL   nRBC 0.0 0.0 - 0.2 %   Neutrophils Relative % 70 %   Neutro Abs 3.6 1.7 - 7.7 K/uL   Lymphocytes Relative 22 %   Lymphs Abs 1.2 0.7 - 4.0 K/uL   Monocytes Relative 6 %   Monocytes Absolute 0.3 0.1 - 1.0 K/uL   Eosinophils Relative 2 %   Eosinophils Absolute 0.1 0.0 - 0.5 K/uL   Basophils Relative 0 %   Basophils Absolute 0.0 0.0 - 0.1 K/uL   Immature Granulocytes 0 %   Abs Immature Granulocytes 0.01 0.00 - 0.07 K/uL    Comment: Performed at [name], [address], [place], [place] [address]  Basic  metabolic panel     Status: Abnormal   Collection Time: 11/28/18  5:36 PM  Result Value Ref Range   Sodium 141 135 - 145 mmol/L   Potassium 5.7 (H) 3.5 - 5.1 mmol/L   Chloride 105 98 - 111 mmol/L   CO2 20 (L) 22 - 32 mmol/L   Glucose, Bld 314 (H) 70 - 99 mg/dL   BUN 108 (H) 6 - 20 mg/dL    Comment: RESULTS CONFIRMED BY MANUAL DILUTION   Creatinine, Ser 10.56 (H) 0.44 - 1.00 mg/dL   Calcium 9.2 8.9 - 10.3 mg/dL   GFR calc non Af Amer 4 (L) >60 mL/min   GFR calc Af Amer 4 (L) >60 mL/min   Anion gap 16 (H) 5 - 15    Comment: Performed at [facility], [address] [place], [place] [address]  POC CBG, ED     Status: Abnormal   Collection Time: 11/28/18  6:16 PM  Result Value Ref Range   Glucose-Capillary 279 (H) 70 - 99 mg/dL    Dg Ankle Complete Right  Result Date: 11/29/2018 CLINICAL DATA:  Fall this morning with swelling and bruising to the right ankle EXAM: RIGHT ANKLE - COMPLETE 3+ VIEW COMPARISON:  None FINDINGS: Oblique fracture through the distal fibular shaft. There is a transverse fracture through the medial malleolus. Widening of the medial clear space. Posterior malleolus fracture is present and retracted proximally. Located hindfoot.  Heel spurs. IMPRESSION: Trimalleolar fracture with widening of the medial clear space. Electronically Signed   By:  Monte Fantasia M.D.   On: 11/29/2018 07:28   Ct Head Wo Contrast  Result Date: 11/29/2018 CLINICAL DATA:  Blunt maxillofacial trauma EXAM: CT HEAD WITHOUT CONTRAST CT CERVICAL SPINE WITHOUT CONTRAST TECHNIQUE: Multidetector CT imaging of the head and cervical spine was performed following the standard protocol without intravenous contrast. Multiplanar CT image reconstructions of the cervical spine were also generated. COMPARISON:  Head CT 11/28/2018.  Cervical spine CT 06/21/2015 FINDINGS: CT HEAD FINDINGS Brain: No evidence of acute infarction, hemorrhage, hydrocephalus, extra-axial collection or mass lesion/mass effect. Vascular: No hyperdense vessel or unexpected calcification. Skull: Negative Sinuses/Orbits: Left nucleation with prosthesis. Right-sided oil tamponade. CT CERVICAL SPINE FINDINGS Alignment: Normal Skull base and vertebrae: Negative for acute fracture Soft tissues and spinal canal: No prevertebral fluid or swelling. No visible canal hematoma. 22 mm left thyroid nodule without worrisome growth since cervical spine CT in 2016. Disc levels:  No notable degenerative changes Upper chest: Negative IMPRESSION: No evidence of acute intracranial or cervical spine injury. Electronically Signed   By: Monte Fantasia M.D.   On: 11/29/2018 07:41   Ct Head Wo Contrast  Result Date: 11/28/2018 CLINICAL DATA:  Jerking and tremors that began today, end-stage renal disease on dialysis, CHF, type II diabetes mellitus, hypertension, COPD EXAM: CT HEAD WITHOUT CONTRAST TECHNIQUE: Contiguous axial images were obtained from the base of the skull through the vertex without intravenous contrast. Sagittal and coronal MPR images reconstructed from axial data set. COMPARISON:  09/26/2018 FINDINGS: Brain: Normal ventricular morphology. No midline shift or mass effect. Normal appearance of brain parenchyma. No intracranial hemorrhage, mass lesion or evidence of acute infarction. No extra-axial fluid collections. Vascular:  No abnormalities Skull: Intact Sinuses/Orbits: Clear. 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 Chronic increased attenuation of the RIGHT optic globe again noted. IMPRESSION: No acute intracranial mallet. Electronically Signed   By: Lavonia Dana M.D.   On: 11/28/2018 19:58  Ct Cervical Spine Wo Contrast  Result Date: 11/29/2018 CLINICAL DATA:  Blunt maxillofacial trauma EXAM: CT HEAD WITHOUT CONTRAST CT CERVICAL SPINE WITHOUT CONTRAST TECHNIQUE: Multidetector CT imaging of the head and cervical spine was performed following the standard protocol without intravenous contrast. Multiplanar CT image reconstructions of the cervical spine were also generated. COMPARISON:  Head CT 11/28/2018.  Cervical spine CT 06/21/2015 FINDINGS: CT HEAD FINDINGS Brain: No evidence of acute infarction, hemorrhage, hydrocephalus, extra-axial collection or mass lesion/mass effect. Vascular: No hyperdense vessel or unexpected calcification. Skull: Negative Sinuses/Orbits: Left nucleation with prosthesis. Right-sided oil tamponade. CT CERVICAL SPINE FINDINGS Alignment: Normal Skull base and vertebrae: Negative for acute fracture Soft tissues and spinal canal: No prevertebral fluid or swelling. No visible canal hematoma. 22 mm left thyroid nodule without worrisome growth since cervical spine CT in 2016. Disc levels:  No notable degenerative changes Upper chest: Negative IMPRESSION: No evidence of acute intracranial or cervical spine injury. Electronically Signed   By: Monte Fantasia M.D.   On: 11/29/2018 07:41    Review of Systems  Constitutional: Positive for chills. Negative for fever and weight loss.  HENT: Negative for ear discharge, ear pain, hearing loss and tinnitus.   Eyes: Negative for blurred vision, double vision, photophobia and pain.  Respiratory: Negative for cough, sputum production and shortness of breath.   Cardiovascular: Negative for chest pain.   Gastrointestinal: Negative for abdominal pain, nausea and vomiting.  Genitourinary: Negative for dysuria, flank pain, frequency and urgency.  Musculoskeletal: Positive for joint pain (Right ankle). Negative for back pain, falls, myalgias and neck pain.  Neurological: Negative for dizziness, tingling, sensory change, focal weakness, loss of consciousness and headaches.  Endo/Heme/Allergies: Does not bruise/bleed easily.  Psychiatric/Behavioral: Negative for depression, memory loss and substance abuse. The patient is not nervous/anxious.    Blood pressure 139/70, pulse 80, temperature 98 F (36.7 C), temperature source Oral, resp. rate 17, height 5\' 8"  (1.727 m), weight (!) 158.8 kg, last menstrual period 08/03/2008, SpO2 96 %. Physical Exam  Constitutional: She appears well-developed and well-nourished. No distress.  HENT:  Head: Normocephalic and atraumatic.  Eyes: Conjunctivae are normal. Right eye exhibits no discharge. Left eye exhibits no discharge. No scleral icterus.  Neck: Normal range of motion.  Cardiovascular: Normal rate and regular rhythm.  Respiratory: Effort normal. No respiratory distress.  Musculoskeletal:     Comments: LLE No traumatic wounds, ecchymosis, or rash  Ankle medial ecchymosis, mild diffuse edema, severe TTP  No knee effusion  Knee stable to varus/ valgus and anterior/posterior stress  Sens DPN, SPN, TN intact  Motor EHL 5/5  DP 1+, PT 0  Neurological: She is alert.  Skin: Skin is warm and dry. She is not diaphoretic.  Psychiatric: She has a normal mood and affect. Her behavior is normal.    Assessment/Plan: Right ankle fx -- Will splint, keep NWB. She should ice and elevate at home. F/u with Dr. Lucia Gaskins in office on Wednesday to discuss plans for surgery. Multiple medical problems including ESRD on HD, HTN, DM, OSA, and COPD    Lisette Abu, PA-C Orthopedic Surgery [phone] 11/29/2018, 9:25 AM

## 2018-11-29 NOTE — ED Notes (Signed)
Assisted pt to dial family on cell phone.

## 2018-11-29 NOTE — ED Notes (Signed)
Patient transported to X-ray 

## 2018-11-29 NOTE — ED Provider Notes (Signed)
[facility] EMERGENCY DEPARTMENT Provider Note   CSN: [id number] Arrival date & time: 11/29/18  1809    History   Chief Complaint Chief Complaint  Patient presents with   Weakness    HPI NICKIA BOESEN is a 53 y.o. female.     HPI  The patient is a 53 year old female, she has a known history of COPD, congestive heart failure, she has diabetes and a history of blindness in the left eye.  She is known to have end-stage renal disease and has been on dialysis since approximately November or December 2019.  She is currently using her right dialysis catheter in the right chest wall, she has a fistula in her left arm which has not yet been used.  Unfortunately the patient has not had dialysis in 5 to 6 days.  She last dialyzed last Wednesday but on Friday she missed dialysis because she had carpal tunnel surgery on Thursday and was in too much pain, she then missed dialysis today after she had fallen in the early morning hours and suffered a trimalleolar fracture of her ankle which left her unable to get to dialysis.  Her husband had called her family doctor, Dr. Luan Pulling, and reported that she was having some abnormal shaking and some confusion and was recommended that she come to the emergency department.  The patient is slightly confused, she has been treated with oxycodone today.  She reports that she is very sleepy but not able to give me much in the way of information other than complaining of some intermittent uncontrollable shaking and myoclonic jerking.  Level 5 caveat applies secondary to confusion  Past Medical History:  Diagnosis Date   Acid reflux    takes Tums   Anemia    Arthritis    Bipolar 1 disorder (HCC)    Blindness of left eye    Carpal tunnel syndrome, bilateral    Cervical radiculopathy    CHF (congestive heart failure) (HCC)    Chronic kidney disease    Stage 5- 01/25/17   Constipation    COPD (chronic obstructive pulmonary disease) ([place]) 2014   bronchitis   Degenerative disc disease, thoracic    Depression    Diabetes mellitus    Type II   Diabetic retinopathy ([place])    Dyspnea    when walking   End stage kidney disease ([place])    M, W, F [facility]   Hypertension    Noncompliance with medication regimen    Noncompliance with medication regimen    Obesity (BMI 30-39.9)    OSA (obstructive sleep apnea)    cpap   Panic attack    RLS (restless legs syndrome)     Patient Active Problem List   Diagnosis Date Noted   Metabolic encephalopathy 23/55/7322   SIRS (systemic inflammatory response syndrome) ([place]) 06/23/2018   UTI (urinary tract infection) 02/26/2018   Volume overload 02/25/2018   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   Dialysis patient ([place]) 12/13/2017   ESRD (end stage renal disease) on dialysis ([place])    Chronic diarrhea 10/08/2017   Renal failure (ARF), acute on chronic ([place]) 04/22/2017   Anasarca associated with disorder of kidney 04/20/2017   Protein-calorie malnutrition, severe 11/29/2016   HTN (hypertension), malignant 11/20/2016   Bipolar 1 disorder ([place]) 11/20/2016   Noncompliance with medication regimen 11/20/2016   Panic attack 11/20/2016   Uncontrolled type 2 diabetes mellitus with hyperglycemia, without long-term current use of insulin ([place]) 10/27/2016   Sensory  disturbance 10/27/2016   Left hemiparesis ([place]) 10/27/2016   Cholelithiasis 04/26/2014   Hyponatremia 04/26/2014   Gallstones 04/06/2014   Diabetic ulcer of left great toe ([place]) 10/09/2013   Diabetes mellitus, type 2 ([place]) 05/30/2013   Hyperglycemia 05/30/2013   Internal hemorrhoids with other complication 30/86/5784   Umbilical hernia without mention of obstruction or gangrene 04/15/2013   Anemia 04/15/2013   DM 05/13/2010   Hyperlipidemia 05/13/2010   Morbid obesity ([place]) 05/13/2010   Depression with anxiety 05/13/2010   RESTLESS LEG SYNDROME 05/13/2010   Essential  hypertension 05/13/2010   GERD 05/13/2010   RECTAL BLEEDING 05/13/2010   Sleep apnea 05/13/2010   Dysphagia, oropharyngeal phase 05/13/2010    Past Surgical History:  Procedure Laterality Date   AV FISTULA PLACEMENT Right 01/27/2017   Procedure: ARTERIOVENOUS (AV) FISTULA CREATION-RIGHT ARM;  Surgeon: Elam Dutch, MD;  Location: [place];  Service: Vascular;  Laterality: Right;   AV FISTULA PLACEMENT Left 08/03/2018   Procedure: LEFT ARTERIOVENOUS (AV) FISTULA CREATION;  Surgeon: Elam Dutch, MD;  Location: [place];  Service: Vascular;  Laterality: Left;   BIOPSY N/A 05/17/2013   Procedure: BIOPSY;  Surgeon: [name], MD;  Location: [facility];  Service: Endoscopy;  Laterality: N/A;   CARPAL TUNNEL RELEASE Left 11/25/2018   Procedure: LEFT CARPAL TUNNEL RELEASE;  Surgeon: Charlotte Crumb, MD;  Location: [place];  Service: Orthopedics;  Laterality: Left;   CHOLECYSTECTOMY N/A 04/27/2014   Procedure: LAPAROSCOPIC CHOLECYSTECTOMY;  Surgeon: [name], MD;  Location: [facility];  Service: General;  Laterality: N/A;   COLONOSCOPY  06/06/2010   ONG:EXBMWU bleeding secondary to internal hemorrhoids but incomplete evaluation secondary to poor right colon prep/small rectal and sigmoid colon polyps (hyperplastic). PROPOFOL   COLONOSCOPY  May 2013   Dr. Gilliam/[facility]: 5 mm a descending colon polyp, hyperplastic. Adequate bowel prep.   ENDOMETRIAL ABLATION     ENUCLEATION Left 02/04/2018   ENUCLEATION WITH PLACEMENT OF IMPLANT LEFT EYE   ENUCLEATION Left 02/04/2018   Procedure: ENUCLEATION WITH PLACEMENT OF IMPLANT LEFT EYE;  Surgeon: Clista Bernhardt, MD;  Location: [place];  Service: Ophthalmology;  Laterality: Left;   ESOPHAGOGASTRODUODENOSCOPY  06/06/2010   XLK:GMWNUUV erythema and edema of body of stomach, with sessile polypoid lesions. bx benign. no h.pylori   ESOPHAGOGASTRODUODENOSCOPY (EGD) WITH PROPOFOL N/A 05/17/2013   Dr. Oneida Alar: normal esophagus, moderate nodular  gastritis, negative path, empiric Savary dilation   EYE SURGERY  07/2017   sx for glaucoma   FLEXIBLE SIGMOIDOSCOPY N/A 05/17/2013   Dr. Oneida Alar: moderate sized internal hemorrhoids   HEMORRHOID BANDING N/A 05/17/2013   Procedure: HEMORRHOID BANDING;  Surgeon: [name], MD;  Location: [facility];  Service: Endoscopy;  Laterality: N/A;  2 bands placed   INCISION AND DRAINAGE ABSCESS Left 10/11/2013   Procedure: INCISION AND DRAINAGE AND DEBRIDEMENT LEFT BREAST  ABSCESS;  Surgeon: [name], MD;  Location: [facility];  Service: General;  Laterality: Left;   INSERTION OF DIALYSIS CATHETER N/A 06/08/2018   Procedure: INSERTION OF Right internal jugular TUNNELED  DIALYSIS CATHETER;  Surgeon: Angelia Mould, MD;  Location: [place];  Service: Vascular;  Laterality: N/A;   [place] Right 06/01/2013   Procedure: INCISION AND DRAINAGE AND DEBRIDEMENT ABSCESS RIGHT BREAST;  Surgeon: [name], MD;  Location: [facility];  Service: General;  Laterality: Right;   LEFT EYE REMOVED Left 01/2018   [facility] on [address].   LIGATION OF ARTERIOVENOUS  FISTULA Right  06/08/2018   Procedure: LIGATION OF ARTERIOVENOUS  FISTULA RIGHT ARM;  Surgeon: Angelia Mould, MD;  Location: [place];  Service: Vascular;  Laterality: Right;   SAVORY DILATION N/A 05/17/2013   Procedure: SAVORY DILATION;  Surgeon: [name], MD;  Location: [facility];  Service: Endoscopy;  Laterality: N/A;  14/15/16   SKIN FULL THICKNESS GRAFT Left 05/05/2018   Procedure: ABDOMINAL DERMIS FAT SKIN GRAFT FULL THICKNESS LEFT EYE;  Surgeon: Clista Bernhardt, MD;  Location: [place];  Service: Ophthalmology;  Laterality: Left;   TUBAL LIGATION       OB History    Gravida  2   Para  2   Term  1   Preterm  1   AB      Living  2     SAB      TAB      Ectopic      Multiple      Live Births               Home Medications    Prior to Admission  medications   Medication Sig Start Date End Date Taking? Authorizing Provider  albuterol (PROVENTIL) (2.5 MG/3ML) 0.083% nebulizer solution Take 2.5 mg by nebulization every 6 (six) hours as needed for wheezing or shortness of breath.    Yes [provider]  ALPRAZolam Duanne Moron) 1 MG tablet Take 0.5 tablets (0.5 mg total) by mouth 2 (two) times daily as needed for anxiety (nerves). Patient taking differently: Take 0.5 mg by mouth 4 (four) times daily.  09/29/18  Yes Johnson, Clanford L, MD  atorvastatin (LIPITOR) 20 MG tablet Take 20 mg by mouth daily.    Yes [provider]  busPIRone (BUSPAR) 7.5 MG tablet Take 7.5 mg by mouth 2 (two) times daily.   Yes [provider]  calcium carbonate (TUMS EX) 750 MG chewable tablet Chew 2-4 tablets by mouth See admin instructions. Patient takes 4 tablets with meals and 2 tablet with snacks three times a day   Yes [provider]  cetirizine (ZYRTEC) 10 MG chewable tablet Chew 10 mg by mouth daily.   Yes [provider]  cinacalcet (SENSIPAR) 30 MG tablet Take 30 mg by mouth daily.   Yes [provider]  colchicine 0.6 MG tablet Take 1 tablet (0.6 mg total) by mouth every Tuesday, Thursday, and Saturday at 6 PM. Take 1 tablet every other day 09/30/18  Yes Johnson, Clanford L, MD  gabapentin (NEURONTIN) 300 MG capsule Take 300 mg by mouth at bedtime.   Yes [provider]  HYDROcodone-acetaminophen (NORCO) 10-325 MG tablet Take 1 tablet by mouth every 6 (six) hours as needed.   Yes [provider]  Insulin Glargine (LANTUS SOLOSTAR) 100 UNIT/ML Solostar Pen Inject 30 Units into the skin every morning. And pen needles 1/day 06/29/18  Yes Renato Shin, MD  lidocaine-prilocaine (EMLA) cream Apply 1 application topically as needed (topical anesthesia for hemodialysis if Gebauers and Lidocaine injection are ineffective.). 04/25/17  Yes Johnson, Clanford L, MD  losartan (COZAAR) 25 MG tablet Take 25 mg by  mouth daily.   Yes [provider]  omeprazole (PRILOSEC) 40 MG capsule Take 40 mg by mouth daily.   Yes [provider]  oxyCODONE-acetaminophen (PERCOCET/ROXICET) 5-325 MG tablet Take 1 tablet by mouth every 4 (four) hours as needed for severe pain. 11/29/18  Yes Tegeler, Gwenyth Allegra, MD  PARoxetine (PAXIL) 20 MG tablet Take 1 daily. This is to prevent panic  attacks Patient taking differently: Take 20 mg by mouth daily. This is to prevent panic attacks 10/28/16  Yes Sinda Du, MD  torsemide (DEMADEX) 100 MG tablet Take 100 mg by mouth 2 (two) times daily.    Yes [provider]  oxyCODONE-acetaminophen (PERCOCET) 5-325 MG tablet Take 1 tablet by mouth every 4 (four) hours as needed for severe pain. Patient not taking: Reported on 11/29/2018 11/25/18 11/25/19  Charlotte Crumb, MD    Family History Family History  Adopted: Yes  Family history unknown: Yes    Social History Social History   Tobacco Use   Smoking status: Former Smoker    Packs/day: 1.50    Years: 25.00    Pack years: 37.50    Types: Cigarettes    Last attempt to quit: 10/23/2007    Years since quitting: 11.1   Smokeless tobacco: Never Used  Substance Use Topics   Alcohol use: No   Drug use: No     Allergies   Codeine and Tape   Review of Systems Review of Systems  Unable to perform ROS: Mental status change     Physical Exam Updated Vital Signs BP (!) 145/84    Pulse 81    Resp (!) 31    Ht 1.727 m (5\' 8" )    Wt (!) 158.7 kg    LMP 08/03/2008 (Exact Date)    SpO2 94%    BMI 53.20 kg/m   Physical Exam Vitals signs and nursing note reviewed.  Constitutional:      General: She is not in acute distress.    Appearance: She is well-developed.  HENT:     Head: Normocephalic and atraumatic.     Mouth/Throat:     Pharynx: No oropharyngeal exudate.  Eyes:     General: No scleral icterus.       Right eye: No discharge.     Comments: Left eye enucleation, right eye  appears normal with pupil and conjunctiv  Neck:     Musculoskeletal: Normal range of motion and neck supple.     Thyroid: No thyromegaly.     Vascular: No JVD.  Cardiovascular:     Rate and Rhythm: Normal rate and regular rhythm.     Heart sounds: Normal heart sounds. No murmur. No friction rub. No gallop.   Pulmonary:     Effort: Pulmonary effort is normal. No respiratory distress.     Breath sounds: Normal breath sounds. No wheezing or rales.  Abdominal:     General: Bowel sounds are normal. There is no distension.     Palpations: Abdomen is soft. There is no mass.     Tenderness: There is no abdominal tenderness.  Musculoskeletal: Normal range of motion.        General: Tenderness present.     Comments: The patient's right foot and left hand are both in splints, she is able to move all 4 extremities however she has some intermittent myoclonic jerking.  Lymphadenopathy:     Cervical: No cervical adenopathy.  Skin:    General: Skin is warm and dry.     Findings: No erythema or rash.  Neurological:     Mental Status: She is alert.     Coordination: Coordination normal.     Comments: The patient is able to wake up, she is mildly somnolent, she has some intermittent myoclonic jerking especially with trying to do any specific movements.  Psychiatric:        Behavior: Behavior normal.  ED Treatments / Results  Labs (all labs ordered are listed, but only abnormal results are displayed) Labs Reviewed  CBC WITH DIFFERENTIAL/PLATELET - Abnormal; Notable for the following components:      Result Value   RBC 3.03 (*)    Hemoglobin 8.5 (*)    HCT 28.9 (*)    MCHC 29.4 (*)    RDW 16.8 (*)    Platelets 144 (*)    All other components within normal limits  COMPREHENSIVE METABOLIC PANEL - Abnormal; Notable for the following components:   Potassium 6.3 (*)    CO2 20 (*)    Glucose, Bld 318 (*)    BUN 114 (*)    Creatinine, Ser 11.26 (*)    Albumin 3.3 (*)    AST 14 (*)     GFR calc non Af Amer 4 (*)    GFR calc Af Amer 4 (*)    Anion gap 16 (*)    All other components within normal limits  CBG MONITORING, ED - Abnormal; Notable for the following components:   Glucose-Capillary 299 (*)    All other components within normal limits  MAGNESIUM  URINALYSIS, ROUTINE W REFLEX MICROSCOPIC  CBG MONITORING, ED    EKG EKG Interpretation  Date/Time:  Monday Nov 29 2018 18:32:35 EDT Ventricular Rate:  83 PR Interval:    QRS Duration: 90 QT Interval:  361 QTC Calculation: 425 R Axis:   56 Text Interpretation:  Sinus rhythm Borderline T abnormalities, lateral leads since last tracing no significant change Confirmed by Noemi Chapel [phone]) on 11/29/2018 6:34:32 PM   Radiology Dg Ankle Complete Right  Result Date: 11/29/2018 CLINICAL DATA:  Fall this morning with swelling and bruising to the right ankle EXAM: RIGHT ANKLE - COMPLETE 3+ VIEW COMPARISON:  None FINDINGS: Oblique fracture through the distal fibular shaft. There is a transverse fracture through the medial malleolus. Widening of the medial clear space. Posterior malleolus fracture is present and retracted proximally. Located hindfoot.  Heel spurs. IMPRESSION: Trimalleolar fracture with widening of the medial clear space. Electronically Signed   By: Monte Fantasia M.D.   On: 11/29/2018 07:28   Ct Head Wo Contrast  Result Date: 11/29/2018 CLINICAL DATA:  Blunt maxillofacial trauma EXAM: CT HEAD WITHOUT CONTRAST CT CERVICAL SPINE WITHOUT CONTRAST TECHNIQUE: Multidetector CT imaging of the head and cervical spine was performed following the standard protocol without intravenous contrast. Multiplanar CT image reconstructions of the cervical spine were also generated. COMPARISON:  Head CT 11/28/2018.  Cervical spine CT 06/21/2015 FINDINGS: CT HEAD FINDINGS Brain: No evidence of acute infarction, hemorrhage, hydrocephalus, extra-axial collection or mass lesion/mass effect. Vascular: No hyperdense vessel or unexpected  calcification. Skull: Negative Sinuses/Orbits: Left nucleation with prosthesis. Right-sided oil tamponade. CT CERVICAL SPINE FINDINGS Alignment: Normal Skull base and vertebrae: Negative for acute fracture Soft tissues and spinal canal: No prevertebral fluid or swelling. No visible canal hematoma. 22 mm left thyroid nodule without worrisome growth since cervical spine CT in 2016. Disc levels:  No notable degenerative changes Upper chest: Negative IMPRESSION: No evidence of acute intracranial or cervical spine injury. Electronically Signed   By: Monte Fantasia M.D.   On: 11/29/2018 07:41   Ct Head Wo Contrast  Result Date: 11/28/2018 CLINICAL DATA:  Jerking and tremors that began today, end-stage renal disease on dialysis, CHF, type II diabetes mellitus, hypertension, COPD EXAM: CT HEAD WITHOUT CONTRAST TECHNIQUE: Contiguous axial images were obtained from the base of the skull through the vertex without intravenous contrast. Sagittal  and coronal MPR images reconstructed from axial data set. COMPARISON:  09/26/2018 FINDINGS: Brain: Normal ventricular morphology. No midline shift or mass effect. Normal appearance of brain parenchyma. No intracranial hemorrhage, mass lesion or evidence of acute infarction. No extra-axial fluid collections. Vascular: No abnormalities Skull: Intact Sinuses/Orbits: Clear. 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 Chronic increased attenuation of the RIGHT optic globe again noted. IMPRESSION: No acute intracranial mallet. Electronically Signed   By: Lavonia Dana M.D.   On: 11/28/2018 19:58   Ct Cervical Spine Wo Contrast  Result Date: 11/29/2018 CLINICAL DATA:  Blunt maxillofacial trauma EXAM: CT HEAD WITHOUT CONTRAST CT CERVICAL SPINE WITHOUT CONTRAST TECHNIQUE: Multidetector CT imaging of the head and cervical spine was performed following the standard protocol without intravenous contrast. Multiplanar CT image reconstructions of the  cervical spine were also generated. COMPARISON:  Head CT 11/28/2018.  Cervical spine CT 06/21/2015 FINDINGS: CT HEAD FINDINGS Brain: No evidence of acute infarction, hemorrhage, hydrocephalus, extra-axial collection or mass lesion/mass effect. Vascular: No hyperdense vessel or unexpected calcification. Skull: Negative Sinuses/Orbits: Left nucleation with prosthesis. Right-sided oil tamponade. CT CERVICAL SPINE FINDINGS Alignment: Normal Skull base and vertebrae: Negative for acute fracture Soft tissues and spinal canal: No prevertebral fluid or swelling. No visible canal hematoma. 22 mm left thyroid nodule without worrisome growth since cervical spine CT in 2016. Disc levels:  No notable degenerative changes Upper chest: Negative IMPRESSION: No evidence of acute intracranial or cervical spine injury. Electronically Signed   By: Monte Fantasia M.D.   On: 11/29/2018 07:41   Dg Chest Port 1 View  Result Date: 11/29/2018 CLINICAL DATA:  Shortness of breath, weakness, history hypertension, CHF, COPD, former smoker EXAM: PORTABLE CHEST 1 VIEW COMPARISON:  Portable exam 1939 hours compared to 09/28/2018 FINDINGS: RIGHT jugular line with tip projecting over SVC. Enlargement of cardiac silhouette with pulmonary vascular congestion. Mediastinal contours normal. Lungs clear. No pleural effusion or pneumothorax. Bones unremarkable. IMPRESSION: Enlargement of cardiac silhouette with pulmonary vascular congestion. No acute infiltrate. Electronically Signed   By: Lavonia Dana M.D.   On: 11/29/2018 19:46    Procedures .Critical Care Performed by: Noemi Chapel, MD Authorized by: Noemi Chapel, MD   Critical care provider statement:    Critical care time (minutes):  35   Critical care time was exclusive of:  Separately billable procedures and treating other patients and teaching time   Critical care was necessary to treat or prevent imminent or life-threatening deterioration of the following conditions:  Renal failure  and metabolic crisis   Critical care was time spent personally by me on the following activities:  Blood draw for specimens, development of treatment plan with patient or surrogate, discussions with consultants, evaluation of patient's response to treatment, examination of patient, obtaining history from patient or surrogate, ordering and performing treatments and interventions, ordering and review of laboratory studies, ordering and review of radiographic studies, pulse oximetry, re-evaluation of patient's condition and review of old charts Comments:         (including critical care time)  Medications Ordered in ED Medications  sodium zirconium cyclosilicate (LOKELMA) packet 10 g (has no administration in time range)  morphine 4 MG/ML injection 4 mg (has no administration in time range)     Initial Impression / Assessment and Plan / ED Course  I have reviewed the triage vital signs and the nursing notes.  Pertinent labs & imaging results that were available during my care of the patient were  reviewed by me and considered in my medical decision making (see chart for details).  Clinical Course as of Nov 28 2141  Mon Nov 29, 2018  2126 Care was discussed with the nephrologist at 9:30 PM, they agree to help with dialysis tomorrow.  They recommend Lokelma   [BM]    Clinical Course User Index [BM] Noemi Chapel, MD      Imaging reviewed from 12 hours ago when she was here for her injuries.  There does appear to be a fracture of the ankle, this has been immobilized.  Her ankle mortise was essentially stable.  She has been nonambulatory, she would not be able to ambulate at this point, she is ill with some type of myoclonic jerking which may be related to drug toxicity as she is on multiple different medications which could cause this such as Neurontin, she is on oxycodone and having some mild somnolence.  Her oxygen level goes above 90% when she wakes up and breathes normally.  She has clear  lung sounds, she does not have any specific focal neurologic abnormalities on exam.  She is slightly confused and not answering all of my questions appropriately.  Will check labs to make sure she does not need dialysis.  She likely needs to be admitted to the hospital.  Labs show that the patient does in fact have progressive hyperkalemia with a level of 6.3.  Her BUN is 114 which may be the ultimate cause of the patient's mental decline and altered mental status.  She is severely uremic.  I discussed her care with the hospitalist at 9:40 PM, they will admit.  I have reevaluated the patient multiple times, there is been no decline in her mental status, she is asking for something for her ankle pain which I think is reasonable.  Morphine given.  She has remained on cardiac monitoring with multiple neurologic exams.  Final Clinical Impressions(s) / ED Diagnoses   Final diagnoses:  Uremia  Hyperkalemia    ED Discharge Orders    None       Noemi Chapel, MD 11/29/18 2144

## 2018-11-29 NOTE — Progress Notes (Signed)
Orthopedic Tech Progress Note Patient Details:  Martha [name] [birth year] [id number] PATIENT had a hard time with me touching her leg. So I applied splint to ankle/leg as is. Ortho Devices Type of Ortho Device: Short leg splint Ortho Device/Splint Location: LRE Ortho Device/Splint Interventions: Adjustment, Application, Ordered   Post Interventions Patient Tolerated: Fair Instructions Provided: Care of device, Adjustment of device   Janit Pagan 11/29/2018, 10:40 AM

## 2018-11-29 NOTE — Discharge Instructions (Addendum)
Keep splint intact and dry.  Keep leg elevated whenever possible.  Ice (over splint) for 30 minutes 4x/day.  Do not put weight down on right foot.  Also, please call your dialysis center to try to get dialysis tomorrow.    Any symptoms change or worsen, is return to the nearest emergency department.

## 2018-11-29 NOTE — ED Provider Notes (Addendum)
[facility] [place] [facility] EMERGENCY DEPARTMENT Provider Note   CSN: [id number] Arrival date & time: 11/29/18  0631    History   Chief Complaint Chief Complaint  Patient presents with  . Fall    HPI Martha [name] is a 53 y.o. female.   The history is provided by the patient.  Fall   She has history of diabetes, hypertension, heart failure, end-stage renal disease on hemodialysis, COPD, blindness and comes in following a fall at home.  She was getting out of bed when she fell and injured her right ankle.  She also hit her head without loss of consciousness.  She was unable to stand and ambulance brought her to the emergency department.  She cannot put a number on her pain, only stating that it hurts a lot.  Past Medical History:  Diagnosis Date  . Acid reflux    takes Tums  . Anemia   . Arthritis   . Bipolar 1 disorder ([place])   . Blindness of left eye   . Carpal tunnel syndrome, bilateral   . Cervical radiculopathy   . CHF (congestive heart failure) ([place])   . Chronic kidney disease    Stage 5- 01/25/17  . Constipation   . COPD (chronic obstructive pulmonary disease) ([place]) 2014   bronchitis  . Degenerative disc disease, thoracic   . Depression   . Diabetes mellitus    Type II  . Diabetic retinopathy ([place])   . Dyspnea    when walking  . End stage kidney disease ([place])    M, W, F [facility]  . Hypertension   . Noncompliance with medication regimen   . Noncompliance with medication regimen   . Obesity (BMI 30-39.9)   . OSA (obstructive sleep apnea)    cpap  . Panic attack   . RLS (restless legs syndrome)     Patient Active Problem List   Diagnosis Date Noted  . Metabolic encephalopathy 82/50/0370  . SIRS (systemic inflammatory response syndrome) ([place]) 06/23/2018  . UTI (urinary tract infection) 02/26/2018  . Volume overload 02/25/2018  . 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 Dialysis patient ([place]) 12/13/2017  . ESRD (end stage renal  disease) on dialysis ([place])   . Chronic diarrhea 10/08/2017  . Renal failure (ARF), acute on chronic (HCC) 04/22/2017  . Anasarca associated with disorder of kidney 04/20/2017  . Protein-calorie malnutrition, severe 11/29/2016  . HTN (hypertension), malignant 11/20/2016  . Bipolar 1 disorder ([place]) 11/20/2016  . Noncompliance with medication regimen 11/20/2016  . Panic attack 11/20/2016  . Uncontrolled type 2 diabetes mellitus with hyperglycemia, without long-term current use of insulin ([place]) 10/27/2016  . Sensory disturbance 10/27/2016  . Left hemiparesis ([place]) 10/27/2016  . Cholelithiasis 04/26/2014  . Hyponatremia 04/26/2014  . Gallstones 04/06/2014  . Diabetic ulcer of left great toe ([place]) 10/09/2013  . Diabetes mellitus, type 2 ([place]) 05/30/2013  . Hyperglycemia 05/30/2013  . Internal hemorrhoids with other complication 48/88/9169  . Umbilical hernia without mention of obstruction or gangrene 04/15/2013  . Anemia 04/15/2013  . DM 05/13/2010  . Hyperlipidemia 05/13/2010  . Morbid obesity ([place]) 05/13/2010  . Depression with anxiety 05/13/2010  . RESTLESS LEG SYNDROME 05/13/2010  . Essential hypertension 05/13/2010  . GERD 05/13/2010  . RECTAL BLEEDING 05/13/2010  . Sleep apnea 05/13/2010  . Dysphagia, oropharyngeal phase 05/13/2010    Past Surgical History:  Procedure Laterality Date  . AV FISTULA PLACEMENT Right 01/27/2017   Procedure: ARTERIOVENOUS (AV) FISTULA CREATION-RIGHT ARM;  Surgeon: Elam Dutch, MD;  [facility];  Service: Vascular;  Laterality: Right;  . AV FISTULA PLACEMENT Left 08/03/2018   Procedure: LEFT ARTERIOVENOUS (AV) FISTULA CREATION;  Surgeon: Elam Dutch, MD;  Location: [facility];  Service: Vascular;  Laterality: Left;  . BIOPSY N/A 05/17/2013   Procedure: BIOPSY;  Surgeon: [name], MD;  Location: [facility];  Service: Endoscopy;  Laterality: N/A;  . CARPAL TUNNEL RELEASE Left 11/25/2018   Procedure: LEFT CARPAL TUNNEL RELEASE;  Surgeon:  Charlotte Crumb, MD;  Location: [facility];  Service: Orthopedics;  Laterality: Left;  . CHOLECYSTECTOMY N/A 04/27/2014   Procedure: LAPAROSCOPIC CHOLECYSTECTOMY;  Surgeon: [name], MD;  Location: [facility];  Service: General;  Laterality: N/A;  . COLONOSCOPY  06/06/2010   WGN:FAOZHY bleeding secondary to internal hemorrhoids but incomplete evaluation secondary to poor right colon prep/small rectal and sigmoid colon polyps (hyperplastic). PROPOFOL  . COLONOSCOPY  May 2013   Dr. Gilliam/[facility]: 5 mm a descending colon polyp, hyperplastic. Adequate bowel prep.  . ENDOMETRIAL ABLATION    . 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 ENUCLEATION Left 02/04/2018   Procedure: ENUCLEATION WITH PLACEMENT OF IMPLANT LEFT EYE;  Surgeon: Clista Bernhardt, MD;  Location: [facility];  Service: Ophthalmology;  Laterality: Left;  . ESOPHAGOGASTRODUODENOSCOPY  06/06/2010   QMV:HQIONGE erythema and edema of body of stomach, with sessile polypoid lesions. bx benign. no h.pylori  . ESOPHAGOGASTRODUODENOSCOPY (EGD) WITH PROPOFOL N/A 05/17/2013   Dr. Oneida Alar: normal esophagus, moderate nodular gastritis, negative path, empiric Savary dilation  . EYE SURGERY  07/2017   sx for glaucoma  . FLEXIBLE SIGMOIDOSCOPY N/A 05/17/2013   Dr. Oneida Alar: moderate sized internal hemorrhoids  . HEMORRHOID BANDING N/A 05/17/2013   Procedure: HEMORRHOID BANDING;  Surgeon: [name], MD;  Location[facility];  Service: Endoscopy;  Laterality: N/A;  2 bands placed  . INCISION AND DRAINAGE ABSCESS Left 10/11/2013   Procedure: INCISION AND DRAINAGE AND DEBRIDEMENT LEFT BREAST  ABSCESS;  Surgeon: [name], MD;  Location: [facility];  Service: General;  Laterality: Left;  . INSERTION OF DIALYSIS CATHETER N/A 06/08/2018   Procedure: INSERTION OF Right internal jugular TUNNELED  DIALYSIS CATHETER;  Surgeon: Angelia Mould, MD;  Location: [place];  Service: Vascular;  Laterality: N/A;  .  IRRIGATION AND DEBRIDEMENT ABSCESS Right 06/01/2013   Procedure: INCISION AND DRAINAGE AND DEBRIDEMENT ABSCESS RIGHT BREAST;  Surgeon: [name], MD;  Location: [facility];  Service: General;  Laterality: Right;  . LEFT EYE REMOVED Left 01/2018   [facility] on [address].  Marland Kitchen LIGATION OF ARTERIOVENOUS  FISTULA Right 06/08/2018   Procedure: LIGATION OF ARTERIOVENOUS  FISTULA RIGHT ARM;  Surgeon: Angelia Mould, MD;  Location: [facility];  Service: Vascular;  Laterality: Right;  . SAVORY DILATION N/A 05/17/2013   Procedure: SAVORY DILATION;  Surgeon: [name], MD;  Location: [facility];  Service: Endoscopy;  Laterality: N/A;  14/15/16  . SKIN FULL THICKNESS GRAFT Left 05/05/2018   Procedure: ABDOMINAL DERMIS FAT SKIN GRAFT FULL THICKNESS LEFT EYE;  Surgeon: Clista Bernhardt, MD;  Location: [facility];  Service: Ophthalmology;  Laterality: Left;  . TUBAL LIGATION       OB History    Gravida  2   Para  2   Term  1   Preterm  1   AB      Living  2     SAB  TAB      Ectopic      Multiple      Live Births               Home Medications    Prior to Admission medications   Medication Sig Start Date End Date Taking? Authorizing Provider  albuterol (PROVENTIL) (2.5 MG/3ML) 0.083% nebulizer solution Take 2.5 mg by nebulization every 6 (six) hours as needed for wheezing or shortness of breath.     [provider]  ALPRAZolam Duanne Moron) 1 MG tablet Take 0.5 tablets (0.5 mg total) by mouth 2 (two) times daily as needed for anxiety (nerves). Patient taking differently: Take 0.5 mg by mouth 4 (four) times daily.  09/29/18   Johnson, Clanford L, MD  atorvastatin (LIPITOR) 20 MG tablet Take 20 mg by mouth daily.     [provider]  busPIRone (BUSPAR) 7.5 MG tablet Take 7.5 mg by mouth 2 (two) times daily.    [provider]  calcium carbonate (TUMS EX) 750 MG chewable tablet Chew 2-4 tablets by mouth See admin instructions. Patient takes  4 tablets with meals and 2 tablet with snacks three times a day    [provider]  cetirizine (ZYRTEC) 10 MG chewable tablet Chew 10 mg by mouth daily.    [provider]  cinacalcet (SENSIPAR) 30 MG tablet Take 30 mg by mouth daily.    [provider]  colchicine 0.6 MG tablet Take 1 tablet (0.6 mg total) by mouth every Tuesday, Thursday, and Saturday at 6 PM. Take 1 tablet every other day 09/30/18   Wynetta Emery, Clanford L, MD  gabapentin (NEURONTIN) 300 MG capsule Take 300 mg by mouth at bedtime.    [provider]  HYDROcodone-acetaminophen (NORCO) 10-325 MG tablet Take 1 tablet by mouth every 6 (six) hours as needed.    [provider]  Insulin Glargine (LANTUS SOLOSTAR) 100 UNIT/ML Solostar Pen Inject 30 Units into the skin every morning. And pen needles 1/day 06/29/18   Renato Shin, MD  lidocaine-prilocaine (EMLA) cream Apply 1 application topically as needed (topical anesthesia for hemodialysis if Gebauers and Lidocaine injection are ineffective.). 04/25/17   Johnson, Clanford L, MD  losartan (COZAAR) 25 MG tablet Take 25 mg by mouth daily.    [provider]  omeprazole (PRILOSEC) 40 MG capsule Take 40 mg by mouth daily.    [provider]  oxyCODONE-acetaminophen (PERCOCET) 5-325 MG tablet Take 1 tablet by mouth every 4 (four) hours as needed for severe pain. 11/25/18 11/25/19  Charlotte Crumb, MD  PARoxetine (PAXIL) 20 MG tablet Take 1 daily. This is to prevent panic attacks Patient taking differently: Take 20 mg by mouth daily. This is to prevent panic attacks 10/28/16   Sinda Du, MD  torsemide (DEMADEX) 100 MG tablet Take 100 mg by mouth 2 (two) times daily.     [provider]    Family History Family History  Adopted: Yes  Family history unknown: Yes    Social History Social History   Tobacco Use  . Smoking status: Former Smoker    Packs/day: 1.50    Years: 25.00    Pack years: 37.50    Types:  Cigarettes    Last attempt to quit: 10/23/2007    Years since quitting: 11.1  . Smokeless tobacco: Never Used  Substance Use Topics  . Alcohol use: No  . Drug use: No     Allergies   Codeine and Tape   Review of Systems  Review of Systems  All other systems reviewed and are negative.    Physical Exam Updated Vital Signs BP (!) 150/91   Temp 98 F (36.7 C) (Oral)   Resp (!) 23   Ht 5\' 8"  (1.727 m)   Wt (!) 158.8 kg   LMP 08/03/2008 (Exact Date)   SpO2 99%   BMI 53.22 kg/m   Physical Exam Vitals signs and nursing note reviewed.    53 year old female, resting comfortably and in no acute distress. Vital signs are significant for elevated blood pressure and respiratory rate. Oxygen saturation is 99%, which is normal. Head is normocephalic and atraumatic. PERRLA, EOMI. Oropharynx is clear. Neck is nontender supple without adenopathy or JVD. Back is nontender and there is no CVA tenderness. Lungs are clear without rales, wheezes, or rhonchi. Chest is nontender.  Dialysis access catheters present right subclavian area. Heart has regular rate and rhythm without murmur. Abdomen is soft, flat, nontender without masses or hepatosplenomegaly and peristalsis is normoactive. Extremities: Mild swelling and marked tenderness in the right ankle.  Distal pulses are palpable, capillary refill is prompt.  Inconsistent findings on sensory exam.  AV fistula is present in the left forearm and bandaged, not evaluated for presence of thrill or bruit. Skin is warm and dry without rash. Neurologic: Mental status is normal, cranial nerves are intact, there are no motor or sensory deficits.  ED Treatments / Results  Labs (all labs ordered are listed, but only abnormal results are displayed) Labs Reviewed - No data to display  EKG EKG Interpretation  Date/Time:  Monday Nov 29 2018 06:32:36 EDT Ventricular Rate:  88 PR Interval:    QRS Duration: 78 QT Interval:  375 QTC Calculation: 073  R Axis:   36 Text Interpretation:  Sinus rhythm Low voltage, precordial leads Otherwise within normal limits When compared with ECG of 11/28/2018, No significant change was found Confirmed by Delora Fuel ([id number]) on 11/29/2018 7:03:07 AM   Radiology Ct Head Wo Contrast  Result Date: 11/28/2018 CLINICAL DATA:  Jerking and tremors that began today, end-stage renal disease on dialysis, CHF, type II diabetes mellitus, hypertension, COPD EXAM: CT HEAD WITHOUT CONTRAST TECHNIQUE: Contiguous axial images were obtained from the base of the skull through the vertex without intravenous contrast. Sagittal and coronal MPR images reconstructed from axial data set. COMPARISON:  09/26/2018 FINDINGS: Brain: Normal ventricular morphology. No midline shift or mass effect. Normal appearance of brain parenchyma. No intracranial hemorrhage, mass lesion or evidence of acute infarction. No extra-axial fluid collections. Vascular: No abnormalities Skull: Intact Sinuses/Orbits: Clear. 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 Chronic increased attenuation of the RIGHT optic globe again noted. IMPRESSION: No acute intracranial mallet. Electronically Signed   By: Lavonia Dana M.D.   On: 11/28/2018 19:58    Procedures Procedures   Medications Ordered in ED Medications  morphine 4 MG/ML injection 4 mg (has no administration in time range)  ondansetron (ZOFRAN) injection 4 mg (has no administration in time range)     Initial Impression / Assessment and Plan / ED Course  I have reviewed the triage vital signs and the nursing notes.  Pertinent labs & imaging results that were available during my care of the patient were reviewed by me and considered in my medical decision making (see chart for details).  Fall at home with injury to right ankle as well as head injury.  She is being sent for CT of head and cervical  spine, plain x-rays of right ankle.  Old records are reviewed, and  she actually had a head CT yesterday for evaluation of tremor.  She is given morphine for pain.  ECG is unremarkable.  Case is signed out to Dr. Sherry Ruffing.  Final Clinical Impressions(s) / ED Diagnoses   Final diagnoses:  Fall at home, initial encounter  Acute right ankle pain  End-stage renal disease on hemodialysis [facility])    ED Discharge Orders    None       Delora Fuel, MD 00/52/59 1028    Delora Fuel, MD 90/22/84 [phone]

## 2018-11-29 NOTE — ED Notes (Signed)
Patient verbalizes understanding of discharge instructions. Opportunity for questioning and answers were provided to patient. Patient instructed on care for leg splint and need to follow up with orthopaedic surgery provided on discharge sheet. Pt also instructed on the need to call dialysis and go tomorrow. Armband removed by staff, pt discharged from ED in wheelchair with fiance.

## 2018-11-29 NOTE — ED Provider Notes (Signed)
7:03 AM Care assumed from Dr. Roxanne Mins.  At time of transfer care, patient is awaiting results of diagnostic imaging including head CT, cervical spine CT, and ankle x-rays.  Patient reportedly had a fall and is having right ankle pain.  Anticipate discharge if work-up is reassuring with outpatient follow-up.  X-ray shows tri-mall fracture.  Orthopedics was called who saw the patient.  They recommended splinting and follow-up in clinic in several days for outpatient surgery.  Patient reports that she cannot get to dialysis today as she Artie missed her treatment this morning.  She reports that she had hyperkalemia last night and was told to go this morning.  Spoke with nephrology and they recommended checking it now.  It was reassessed and it was nearly the same at 5.8.  They requested she get  a dose of Lokelma and get dialysis tomorrow.  Patient was agreeable to this plan.  Patient discharged for dialysis tomorrow and outpatient orthopedic follow-up.  Patient agreed with plan of care and was discharged in good condition.  Clinical Impression: 1. Fall at home, initial encounter   2. Acute right ankle pain   3. End-stage renal disease on hemodialysis [facility])     Disposition: Discharge  Condition: Good  I have discussed the results, Dx and Tx plan with the pt(& family if present). He/she/they expressed understanding and agree(s) with the plan. Discharge instructions discussed at great length. Strict return precautions discussed and pt &/or family have verbalized understanding of the instructions. No further questions at time of discharge.    Discharge Medication List as of 11/29/2018 11:44 AM    START taking these medications   Details  !! oxyCODONE-acetaminophen (PERCOCET/ROXICET) 5-325 MG tablet Take 1 tablet by mouth every 4 (four) hours as needed for severe pain., Starting Mon 11/29/2018, Normal     !! - Potential duplicate medications found. Please discuss with provider.      Follow  Up: Erle Crocker, MD [place] [id number] [phone]  Schedule an appointment as soon as possible for a visit on 12/01/2018      Charisse Wendell, Gwenyth Allegra, MD 11/29/18 [phone]

## 2018-11-30 DIAGNOSIS — T3995XA Adverse effect of unspecified nonopioid analgesic, antipyretic and antirheumatic, initial encounter: Secondary | ICD-10-CM | POA: Diagnosis not present

## 2018-11-30 DIAGNOSIS — Z1159 Encounter for screening for other viral diseases: Secondary | ICD-10-CM | POA: Diagnosis not present

## 2018-11-30 DIAGNOSIS — E11319 Type 2 diabetes mellitus with unspecified diabetic retinopathy without macular edema: Secondary | ICD-10-CM | POA: Diagnosis present

## 2018-11-30 DIAGNOSIS — Z992 Dependence on renal dialysis: Secondary | ICD-10-CM | POA: Diagnosis not present

## 2018-11-30 DIAGNOSIS — Z9115 Patient's noncompliance with renal dialysis: Secondary | ICD-10-CM | POA: Diagnosis not present

## 2018-11-30 DIAGNOSIS — R410 Disorientation, unspecified: Secondary | ICD-10-CM | POA: Diagnosis present

## 2018-11-30 DIAGNOSIS — D631 Anemia in chronic kidney disease: Secondary | ICD-10-CM | POA: Diagnosis present

## 2018-11-30 DIAGNOSIS — W06XXXA Fall from bed, initial encounter: Secondary | ICD-10-CM | POA: Diagnosis present

## 2018-11-30 DIAGNOSIS — E1122 Type 2 diabetes mellitus with diabetic chronic kidney disease: Secondary | ICD-10-CM | POA: Diagnosis present

## 2018-11-30 DIAGNOSIS — N186 End stage renal disease: Secondary | ICD-10-CM | POA: Diagnosis present

## 2018-11-30 DIAGNOSIS — E8889 Other specified metabolic disorders: Secondary | ICD-10-CM | POA: Diagnosis present

## 2018-11-30 DIAGNOSIS — M545 Low back pain: Secondary | ICD-10-CM | POA: Diagnosis present

## 2018-11-30 DIAGNOSIS — E875 Hyperkalemia: Secondary | ICD-10-CM | POA: Diagnosis present

## 2018-11-30 DIAGNOSIS — I509 Heart failure, unspecified: Secondary | ICD-10-CM | POA: Diagnosis present

## 2018-11-30 DIAGNOSIS — N2581 Secondary hyperparathyroidism of renal origin: Secondary | ICD-10-CM | POA: Diagnosis present

## 2018-11-30 DIAGNOSIS — G9341 Metabolic encephalopathy: Secondary | ICD-10-CM | POA: Diagnosis present

## 2018-11-30 DIAGNOSIS — I952 Hypotension due to drugs: Secondary | ICD-10-CM | POA: Diagnosis not present

## 2018-11-30 DIAGNOSIS — Z6841 Body Mass Index (BMI) 40.0 and over, adult: Secondary | ICD-10-CM | POA: Diagnosis not present

## 2018-11-30 DIAGNOSIS — G8929 Other chronic pain: Secondary | ICD-10-CM | POA: Diagnosis present

## 2018-11-30 DIAGNOSIS — G253 Myoclonus: Secondary | ICD-10-CM | POA: Diagnosis present

## 2018-11-30 DIAGNOSIS — S82851A Displaced trimalleolar fracture of right lower leg, initial encounter for closed fracture: Secondary | ICD-10-CM | POA: Diagnosis present

## 2018-11-30 DIAGNOSIS — K5900 Constipation, unspecified: Secondary | ICD-10-CM | POA: Diagnosis present

## 2018-11-30 DIAGNOSIS — Y92009 Unspecified place in unspecified non-institutional (private) residence as the place of occurrence of the external cause: Secondary | ICD-10-CM | POA: Diagnosis not present

## 2018-11-30 DIAGNOSIS — J449 Chronic obstructive pulmonary disease, unspecified: Secondary | ICD-10-CM | POA: Diagnosis present

## 2018-11-30 DIAGNOSIS — G2581 Restless legs syndrome: Secondary | ICD-10-CM | POA: Diagnosis present

## 2018-11-30 DIAGNOSIS — I132 Hypertensive heart and chronic kidney disease with heart failure and with stage 5 chronic kidney disease, or end stage renal disease: Secondary | ICD-10-CM | POA: Diagnosis present

## 2018-11-30 LAB — GLUCOSE, CAPILLARY
Glucose-Capillary: 147 mg/dL — ABNORMAL HIGH (ref 70–99)
Glucose-Capillary: 162 mg/dL — ABNORMAL HIGH (ref 70–99)
Glucose-Capillary: 217 mg/dL — ABNORMAL HIGH (ref 70–99)
Glucose-Capillary: 209 mg/dL — ABNORMAL HIGH (ref 70–99)

## 2018-11-30 LAB — MRSA PCR SCREENING: MRSA by PCR: NEGATIVE

## 2018-11-30 LAB — IRON AND TIBC
Iron: 23 ug/dL — ABNORMAL LOW (ref 28–170)
Saturation Ratios: 10 % — ABNORMAL LOW (ref 10.4–31.8)
TIBC: 230 ug/dL — ABNORMAL LOW (ref 250–450)
UIBC: 207 ug/dL

## 2018-11-30 LAB — BASIC METABOLIC PANEL
Anion gap: 16 — ABNORMAL HIGH (ref 5–15)
BUN: 116 mg/dL — ABNORMAL HIGH (ref 6–20)
CO2: 21 mmol/L — ABNORMAL LOW (ref 22–32)
Calcium: 9.4 mg/dL (ref 8.9–10.3)
Chloride: 107 mmol/L (ref 98–111)
Creatinine, Ser: 11.79 mg/dL — ABNORMAL HIGH (ref 0.44–1.00)
GFR calc Af Amer: 4 mL/min — ABNORMAL LOW (ref >60)
GFR calc non Af Amer: 3 mL/min — ABNORMAL LOW (ref >60)
Glucose, Bld: 157 mg/dL — ABNORMAL HIGH (ref 70–99)
Potassium: 5 mmol/L (ref 3.5–5.1)
Sodium: 144 mmol/L (ref 135–145)

## 2018-11-30 MED ORDER — SODIUM CHLORIDE 0.9 % IV SOLN: 100.0000 mL | INTRAVENOUS | Status: DC | PRN

## 2018-11-30 MED ORDER — ALTEPLASE 2 MG IJ SOLR: 2.0000 mg | Freq: Once | INTRAMUSCULAR | Status: DC | PRN

## 2018-11-30 MED ORDER — HEPARIN SODIUM (PORCINE) 1000 UNIT/ML DIALYSIS: 1000.0000 [IU] | INTRAMUSCULAR | Status: DC | PRN

## 2018-11-30 MED ORDER — CHLORHEXIDINE GLUCONATE CLOTH 2 % EX PADS
6.0000 | MEDICATED_PAD | Freq: Every day | CUTANEOUS | Status: DC
  Administered 2018-11-30 – 2018-12-08 (×6): 6 via TOPICAL

## 2018-11-30 MED ORDER — HYDROCODONE-ACETAMINOPHEN 5-325 MG PO TABS
1.0000 | ORAL_TABLET | Freq: Four times a day (QID) | ORAL | Status: DC | PRN
  Administered 2018-11-30 – 2018-12-07 (×9): 1 via ORAL
  Filled 2018-11-30 (×11): qty 1

## 2018-11-30 MED ORDER — HEPARIN SODIUM (PORCINE) 1000 UNIT/ML DIALYSIS
40.0000 [IU]/kg | Freq: Once | INTRAMUSCULAR | Status: AC
  Administered 2018-11-30: 12:00:00 6700 [IU] via INTRAVENOUS_CENTRAL
  Filled 2018-11-30 (×2): qty 7

## 2018-11-30 MED ORDER — HEPARIN SODIUM (PORCINE) 1000 UNIT/ML DIALYSIS
3000.0000 [IU] | INTRAMUSCULAR | Status: DC | PRN
  Filled 2018-11-30 (×4): qty 3

## 2018-11-30 MED ORDER — RENA-VITE PO TABS
1.0000 | ORAL_TABLET | Freq: Every day | ORAL | Status: DC
  Administered 2018-11-30 – 2018-12-07 (×8): 1 via ORAL
  Filled 2018-11-30 (×8): qty 1

## 2018-11-30 MED ORDER — MORPHINE SULFATE (PF) 4 MG/ML IV SOLN
4.0000 mg | INTRAVENOUS | Status: DC | PRN
  Administered 2018-11-30 – 2018-12-03 (×13): 4 mg via INTRAVENOUS
  Filled 2018-11-30 (×13): qty 1

## 2018-11-30 MED ORDER — LOSARTAN POTASSIUM 50 MG PO TABS
25.0000 mg | ORAL_TABLET | Freq: Every day | ORAL | Status: DC
  Administered 2018-12-02 – 2018-12-07 (×6): 25 mg via ORAL
  Filled 2018-11-30 (×8): qty 1

## 2018-11-30 MED ORDER — CALCIUM CARBONATE ANTACID 500 MG PO CHEW
4.0000 | CHEWABLE_TABLET | Freq: Three times a day (TID) | ORAL | Status: DC
  Administered 2018-11-30 – 2018-12-01 (×4): 800 mg via ORAL
  Filled 2018-11-30 (×15): qty 4

## 2018-11-30 MED ORDER — CALCIUM CARBONATE ANTACID 500 MG PO CHEW
2.0000 | CHEWABLE_TABLET | ORAL | Status: DC
  Administered 2018-12-04 – 2018-12-08 (×3): 400 mg via ORAL
  Filled 2018-11-30: qty 2

## 2018-11-30 MED ORDER — CHLORHEXIDINE GLUCONATE CLOTH 2 % EX PADS
6.0000 | MEDICATED_PAD | Freq: Every day | CUTANEOUS | Status: DC
  Administered 2018-11-30 – 2018-12-08 (×5): 6 via TOPICAL

## 2018-11-30 MED ORDER — PENTAFLUOROPROP-TETRAFLUOROETH EX AERO: 1.0000 "application " | INHALATION_SPRAY | CUTANEOUS | Status: DC | PRN

## 2018-11-30 NOTE — Consult Note (Signed)
Reason for Consult:Uremia, hyperkalemia, CHF Referring Physician: Dr. [name] is an 53 y.o. female.  HPI: 78yr female with DM >12 Yr, HTN >74yr, bipolar, massive obesity, OSA, COPD, Blindness, CTS recent Surgery, ankle fx from fall yesterday, GERD, DJD, nonadherence to medical therapy.  Came to ED with shaking, SOB, fell and fx ankle.  No HD x 1 wk, "due to her other problems".  SOB now at rest, chills, shaking all over.  No itching, ms cramping, but thinks has bit tongue.. On HD since 10/18, at [facility]. Constitutional: weak, as above, feels bad Eyes: blind, L eye shrunken Ears, nose, mouth, throat, and face: dry mouth Respiratory: SOB Cardiovascular: as above, no CP Gastrointestinal: constip, indigestion Genitourinary:negative Integument/breast: anemia Hematologic/lymphatic: anemia Musculoskeletal:L hand CTS ,, L ankle fx Neurological: shaking Behavioral/Psych: bipolar Endocrine: DM Allergic/Immunologic: codeine   Dialyzes at [facility]  on MWF since 10/18. Primary Nephrologist Martha [name].. Access RIJcath, maturing LUA AVF.  Past Medical History:  Diagnosis Date  . Acid reflux    takes Tums  . Anemia   . Arthritis   . Bipolar 1 disorder ([place])   . Blindness of left eye   . Carpal tunnel syndrome, bilateral   . Cervical radiculopathy   . CHF (congestive heart failure) ([place])   . Chronic kidney disease    Stage 5- 01/25/17  . Constipation   . COPD (chronic obstructive pulmonary disease) ([name]) 2014   bronchitis  . Degenerative disc disease, thoracic   . Depression   . Diabetes mellitus    Type II  . Diabetic retinopathy ([place])   . Dyspnea    when walking  . End stage kidney disease ([place])    M, W, F [place]   . Hypertension   . Noncompliance with medication regimen   . Noncompliance with medication regimen   . Obesity (BMI 30-39.9)   . OSA (obstructive sleep apnea)    cpap  . Panic attack   . RLS (restless legs syndrome)      Past Surgical History:  Procedure Laterality Date  . AV FISTULA PLACEMENT Right 01/27/2017   Procedure: ARTERIOVENOUS (AV) FISTULA CREATION-RIGHT ARM;  Surgeon: Elam Dutch, MD;  Location: [facility] OR;  Service: Vascular;  Laterality: Right;  . AV FISTULA PLACEMENT Left 08/03/2018   Procedure: LEFT ARTERIOVENOUS (AV) FISTULA CREATION;  Surgeon: Elam Dutch, MD;  Location: [place];  Service: Vascular;  Laterality: Left;  . BIOPSY N/A 05/17/2013   Procedure: BIOPSY;  Surgeon: [name], MD;  Location: [facility];  Service: Endoscopy;  Laterality: N/A;  . CARPAL TUNNEL RELEASE Left 11/25/2018   Procedure: LEFT CARPAL TUNNEL RELEASE;  Surgeon: Charlotte Crumb, MD;  Location: [place];  Service: Orthopedics;  Laterality: Left;  . CHOLECYSTECTOMY N/A 04/27/2014   Procedure: LAPAROSCOPIC CHOLECYSTECTOMY;  Surgeon: [name], MD;  Location: [facility];  Service: General;  Laterality: N/A;  . COLONOSCOPY  06/06/2010   BOF:BPZWCH bleeding secondary to internal hemorrhoids but incomplete evaluation secondary to poor right colon prep/small rectal and sigmoid colon polyps (hyperplastic). PROPOFOL  . COLONOSCOPY  May 2013   Dr. Gilliam/[facility]: 5 mm a descending colon polyp, hyperplastic. Adequate bowel prep.  . ENDOMETRIAL ABLATION    . 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 ENUCLEATION Left 02/04/2018   Procedure: ENUCLEATION WITH PLACEMENT OF IMPLANT LEFT EYE;  Surgeon: Clista Bernhardt, MD;  Location: [place];  Service: Ophthalmology;  Laterality: Left;  . ESOPHAGOGASTRODUODENOSCOPY  06/06/2010   LNL:GXQJJHE erythema and edema of body of stomach, with sessile polypoid lesions. bx benign. no h.pylori  . ESOPHAGOGASTRODUODENOSCOPY (EGD) WITH PROPOFOL N/A 05/17/2013   Dr. Oneida Alar: normal esophagus, moderate nodular gastritis, negative path, empiric Savary dilation  . EYE SURGERY  07/2017   sx for glaucoma  . FLEXIBLE SIGMOIDOSCOPY N/A 05/17/2013   Dr.  Oneida Alar: moderate sized internal hemorrhoids  . HEMORRHOID BANDING N/A 05/17/2013   Procedure: HEMORRHOID BANDING;  Surgeon: [name], MD;  [facility];  Service: Endoscopy;  Laterality: N/A;  2 bands placed  . INCISION AND DRAINAGE ABSCESS Left 10/11/2013   Procedure: INCISION AND DRAINAGE AND DEBRIDEMENT LEFT BREAST  ABSCESS;  Surgeon: [name], MD;  [facility];  Service: General;  Laterality: Left;  . INSERTION OF DIALYSIS CATHETER N/A 06/08/2018   Procedure: INSERTION OF Right internal jugular TUNNELED  DIALYSIS CATHETER;  Surgeon: Angelia Mould, MD;  Location: [place];  Service: Vascular;  Laterality: N/A;  . IRRIGATION AND DEBRIDEMENT ABSCESS Right 06/01/2013   Procedure: INCISION AND DRAINAGE AND DEBRIDEMENT ABSCESS RIGHT BREAST;  Surgeon: [name], MD;  [facility];  Service: General;  Laterality: Right;  . LEFT EYE REMOVED Left 01/2018   [facility] on [address].  Marland Kitchen LIGATION OF ARTERIOVENOUS  FISTULA Right 06/08/2018   Procedure: LIGATION OF ARTERIOVENOUS  FISTULA RIGHT ARM;  Surgeon: Angelia Mould, MD;  Location: [place];  Service: Vascular;  Laterality: Right;  . SAVORY DILATION N/A 05/17/2013   Procedure: SAVORY DILATION;  Surgeon: [name], MD;  Location[facility];  Service: Endoscopy;  Laterality: N/A;  14/15/16  . SKIN FULL THICKNESS GRAFT Left 05/05/2018   Procedure: ABDOMINAL DERMIS FAT SKIN GRAFT FULL THICKNESS LEFT EYE;  Surgeon: Clista Bernhardt, MD;  Location: [place];  Service: Ophthalmology;  Laterality: Left;  . TUBAL LIGATION      Family History  Adopted: Yes  Family history unknown: Yes    Social History:  reports that she quit smoking about 11 years ago. Her smoking use included cigarettes. She has a 37.50 pack-year smoking history. She has never used smokeless tobacco. She reports that she does not drink alcohol or use drugs.  Allergies:  Allergies  Allergen Reactions  . Codeine Nausea And  Vomiting  . Tape Rash and Other (See Comments)    Pull skin off.  Paper tape is ok    Medications:  I have reviewed the patient's current medications. Prior to Admission:  Medications Prior to Admission  Medication Sig Dispense Refill Last Dose  . albuterol (PROVENTIL) (2.5 MG/3ML) 0.083% nebulizer solution Take 2.5 mg by nebulization every 6 (six) hours as needed for wheezing or shortness of breath.    11/28/2018 at Unknown time  . ALPRAZolam (XANAX) 1 MG tablet Take 0.5 tablets (0.5 mg total) by mouth 2 (two) times daily as needed for anxiety (nerves). (Patient taking differently: Take 0.5 mg by mouth 4 (four) times daily. ) 30 tablet 0 11/28/2018 at Unknown time  . atorvastatin (LIPITOR) 20 MG tablet Take 20 mg by mouth daily.    11/28/2018 at Unknown time  . busPIRone (BUSPAR) 7.5 MG tablet Take 7.5 mg by mouth 2 (two) times daily.   11/28/2018 at Unknown time  . calcium carbonate (TUMS EX) 750 MG chewable tablet Chew 2-4 tablets by mouth See admin instructions. Patient takes 4 tablets with meals and 2 tablet with snacks three times a day   11/28/2018 at Unknown time  .  cetirizine (ZYRTEC) 10 MG chewable tablet Chew 10 mg by mouth daily.   11/28/2018 at Unknown time  . cinacalcet (SENSIPAR) 30 MG tablet Take 30 mg by mouth daily.   11/28/2018 at Unknown time  . colchicine 0.6 MG tablet Take 1 tablet (0.6 mg total) by mouth every Tuesday, Thursday, and Saturday at 6 PM. Take 1 tablet every other day   11/27/2018 at Unknown time  . gabapentin (NEURONTIN) 300 MG capsule Take 300 mg by mouth at bedtime.   Past Week at Unknown time  . HYDROcodone-acetaminophen (NORCO) 10-325 MG tablet Take 1 tablet by mouth every 6 (six) hours as needed.   Past Week at Unknown time  . Insulin Glargine (LANTUS SOLOSTAR) 100 UNIT/ML Solostar Pen Inject 30 Units into the skin every morning. And pen needles 1/day 15 mL 11 11/28/2018 at Unknown time  . lidocaine-prilocaine (EMLA) cream Apply 1 application topically as needed (topical  anesthesia for hemodialysis if Gebauers and Lidocaine injection are ineffective.). 30 g 0 Past Week at Unknown time  . losartan (COZAAR) 25 MG tablet Take 25 mg by mouth daily.   11/28/2018 at Unknown time  . omeprazole (PRILOSEC) 40 MG capsule Take 40 mg by mouth daily.   11/28/2018 at Unknown time  . oxyCODONE-acetaminophen (PERCOCET/ROXICET) 5-325 MG tablet Take 1 tablet by mouth every 4 (four) hours as needed for severe pain. 15 tablet 0 Past Week at Unknown time  . PARoxetine (PAXIL) 20 MG tablet Take 1 daily. This is to prevent panic attacks (Patient taking differently: Take 20 mg by mouth daily. This is to prevent panic attacks) 30 tablet 12 11/28/2018 at Unknown time  . torsemide (DEMADEX) 100 MG tablet Take 100 mg by mouth 2 (two) times daily.    11/28/2018 at Unknown time  . oxyCODONE-acetaminophen (PERCOCET) 5-325 MG tablet Take 1 tablet by mouth every 4 (four) hours as needed for severe pain. (Patient not taking: Reported on 11/29/2018) 20 tablet 0 Not Taking at Unknown time    Results for orders placed or performed during the hospital encounter of 11/29/18 (from the past 48 hour(s))  CBG monitoring, ED     Status: Abnormal   Collection Time: 11/29/18  7:20 PM  Result Value Ref Range   Glucose-Capillary 299 (H) 70 - 99 mg/dL   Comment 1 Notify RN    Comment 2 Document in Chart   CBC with Differential/Platelet     Status: Abnormal   Collection Time: 11/29/18  7:36 PM  Result Value Ref Range   WBC 6.3 4.0 - 10.5 K/uL   RBC 3.03 (L) 3.87 - 5.11 MIL/uL   Hemoglobin 8.5 (L) 12.0 - 15.0 g/dL   HCT 28.9 (L) 36.0 - 46.0 %   MCV 95.4 80.0 - 100.0 fL   MCH 28.1 26.0 - 34.0 pg   MCHC 29.4 (L) 30.0 - 36.0 g/dL   RDW 16.8 (H) 11.5 - 15.5 %   Platelets 144 (L) 150 - 400 K/uL   nRBC 0.0 0.0 - 0.2 %   Neutrophils Relative % 81 %   Neutro Abs 5.1 1.7 - 7.7 K/uL   Lymphocytes Relative 14 %   Lymphs Abs 0.9 0.7 - 4.0 K/uL   Monocytes Relative 4 %   Monocytes Absolute 0.3 0.1 - 1.0 K/uL    Eosinophils Relative 1 %   Eosinophils Absolute 0.1 0.0 - 0.5 K/uL   Basophils Relative 0 %   Basophils Absolute 0.0 0.0 - 0.1 K/uL   Immature Granulocytes 0 %  Abs Immature Granulocytes 0.02 0.00 - 0.07 K/uL    Comment: Performed at [facility], [address], [place], [place] [address]  Comprehensive metabolic panel     Status: Abnormal   Collection Time: 11/29/18  7:36 PM  Result Value Ref Range   Sodium 140 135 - 145 mmol/L   Potassium 6.3 (HH) 3.5 - 5.1 mmol/L    Comment: CRITICAL RESULT CALLED TO, READ BACK BY AND VERIFIED WITH: PRUITT,G ON 11/29/18 AT 2050 BY LOY,C    Chloride 104 98 - 111 mmol/L   CO2 20 (L) 22 - 32 mmol/L   Glucose, Bld 318 (H) 70 - 99 mg/dL   BUN 114 (H) 6 - 20 mg/dL    Comment: RESULTS CONFIRMED BY MANUAL DILUTION   Creatinine, Ser 11.26 (H) 0.44 - 1.00 mg/dL   Calcium 9.1 8.9 - 10.3 mg/dL   Total Protein 7.4 6.5 - 8.1 g/dL   Albumin 3.3 (L) 3.5 - 5.0 g/dL   AST 14 (L) 15 - 41 U/L   ALT 5 0 - 44 U/L   Alkaline Phosphatase 93 38 - 126 U/L   Total Bilirubin 0.4 0.3 - 1.2 mg/dL   GFR calc non Af Amer 4 (L) >60 mL/min   GFR calc Af Amer 4 (L) >60 mL/min   Anion gap 16 (H) 5 - 15    Comment: Performed at [facility], [address] [place], [place] [address]  Magnesium     Status: None   Collection Time: 11/29/18  7:36 PM  Result Value Ref Range   Magnesium 2.0 1.7 - 2.4 mg/dL    Comment: Performed at [facility], [address], [place], [place] [address]  Glucose, capillary     Status: Abnormal   Collection Time: 11/29/18 11:21 PM  Result Value Ref Range   Glucose-Capillary 227 (H) 70 - 99 mg/dL  Basic metabolic panel     Status: Abnormal   Collection Time: 11/30/18  5:39 AM  Result Value Ref Range   Sodium 144 135 - 145 mmol/L   Potassium 5.0 3.5 - 5.1 mmol/L    Comment: DELTA CHECK NOTED   Chloride 107 98 - 111 mmol/L   CO2 21 (L) 22 - 32 mmol/L   Glucose, Bld 157 (H) 70 - 99 mg/dL   BUN 116 (H) 6 - 20 mg/dL    Comment: RESULTS CONFIRMED  BY MANUAL DILUTION   Creatinine, Ser 11.79 (H) 0.44 - 1.00 mg/dL   Calcium 9.4 8.9 - 10.3 mg/dL   GFR calc non Af Amer 3 (L) >60 mL/min   GFR calc Af Amer 4 (L) >60 mL/min   Anion gap 16 (H) 5 - 15    Comment: Performed at [facility], [address] [place], [place] [address]    Dg Ankle Complete Right  Result Date: 11/29/2018 CLINICAL DATA:  Fall this morning with swelling and bruising to the right ankle EXAM: RIGHT ANKLE - COMPLETE 3+ VIEW COMPARISON:  None FINDINGS: Oblique fracture through the distal fibular shaft. There is a transverse fracture through the medial malleolus. Widening of the medial clear space. Posterior malleolus fracture is present and retracted proximally. Located hindfoot.  Heel spurs. IMPRESSION: Trimalleolar fracture with widening of the medial clear space. Electronically Signed   By: Monte Fantasia M.D.   On: 11/29/2018 07:28   Ct Head Wo Contrast  Result Date: 11/29/2018 CLINICAL DATA:  Blunt maxillofacial trauma EXAM: CT HEAD WITHOUT CONTRAST CT CERVICAL SPINE WITHOUT CONTRAST TECHNIQUE: Multidetector CT imaging  of the head and cervical spine was performed following the standard protocol without intravenous contrast. Multiplanar CT image reconstructions of the cervical spine were also generated. COMPARISON:  Head CT 11/28/2018.  Cervical spine CT 06/21/2015 FINDINGS: CT HEAD FINDINGS Brain: No evidence of acute infarction, hemorrhage, hydrocephalus, extra-axial collection or mass lesion/mass effect. Vascular: No hyperdense vessel or unexpected calcification. Skull: Negative Sinuses/Orbits: Left nucleation with prosthesis. Right-sided oil tamponade. CT CERVICAL SPINE FINDINGS Alignment: Normal Skull base and vertebrae: Negative for acute fracture Soft tissues and spinal canal: No prevertebral fluid or swelling. No visible canal hematoma. 22 mm left thyroid nodule without worrisome growth since cervical spine CT in 2016. Disc levels:  No notable degenerative changes Upper  chest: Negative IMPRESSION: No evidence of acute intracranial or cervical spine injury. Electronically Signed   By: Monte Fantasia M.D.   On: 11/29/2018 07:41   Ct Head Wo Contrast  Result Date: 11/28/2018 CLINICAL DATA:  Jerking and tremors that began today, end-stage renal disease on dialysis, CHF, type II diabetes mellitus, hypertension, COPD EXAM: CT HEAD WITHOUT CONTRAST TECHNIQUE: Contiguous axial images were obtained from the base of the skull through the vertex without intravenous contrast. Sagittal and coronal MPR images reconstructed from axial data set. COMPARISON:  09/26/2018 FINDINGS: Brain: Normal ventricular morphology. No midline shift or mass effect. Normal appearance of brain parenchyma. No intracranial hemorrhage, mass lesion or evidence of acute infarction. No extra-axial fluid collections. Vascular: No abnormalities Skull: Intact Sinuses/Orbits: Clear. 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 Chronic increased attenuation of the RIGHT optic globe again noted. IMPRESSION: No acute intracranial mallet. Electronically Signed   By: Lavonia Dana M.D.   On: 11/28/2018 19:58   Ct Cervical Spine Wo Contrast  Result Date: 11/29/2018 CLINICAL DATA:  Blunt maxillofacial trauma EXAM: CT HEAD WITHOUT CONTRAST CT CERVICAL SPINE WITHOUT CONTRAST TECHNIQUE: Multidetector CT imaging of the head and cervical spine was performed following the standard protocol without intravenous contrast. Multiplanar CT image reconstructions of the cervical spine were also generated. COMPARISON:  Head CT 11/28/2018.  Cervical spine CT 06/21/2015 FINDINGS: CT HEAD FINDINGS Brain: No evidence of acute infarction, hemorrhage, hydrocephalus, extra-axial collection or mass lesion/mass effect. Vascular: No hyperdense vessel or unexpected calcification. Skull: Negative Sinuses/Orbits: Left nucleation with prosthesis. Right-sided oil tamponade. CT CERVICAL SPINE FINDINGS Alignment:  Normal Skull base and vertebrae: Negative for acute fracture Soft tissues and spinal canal: No prevertebral fluid or swelling. No visible canal hematoma. 22 mm left thyroid nodule without worrisome growth since cervical spine CT in 2016. Disc levels:  No notable degenerative changes Upper chest: Negative IMPRESSION: No evidence of acute intracranial or cervical spine injury. Electronically Signed   By: Monte Fantasia M.D.   On: 11/29/2018 07:41   Dg Chest Port 1 View  Result Date: 11/29/2018 CLINICAL DATA:  Shortness of breath, weakness, history hypertension, CHF, COPD, former smoker EXAM: PORTABLE CHEST 1 VIEW COMPARISON:  Portable exam 1939 hours compared to 09/28/2018 FINDINGS: RIGHT jugular line with tip projecting over SVC. Enlargement of cardiac silhouette with pulmonary vascular congestion. Mediastinal contours normal. Lungs clear. No pleural effusion or pneumothorax. Bones unremarkable. IMPRESSION: Enlargement of cardiac silhouette with pulmonary vascular congestion. No acute infiltrate. Electronically Signed   By: Lavonia Dana M.D.   On: 11/29/2018 19:46    ROS Blood pressure (!) 193/96, pulse 89, temperature 98.4 F (36.9 C), temperature source Oral, resp. rate (!) 22, height 5\' 8"  (1.727 m), weight (!) 168.1 kg, last menstrual  period 08/03/2008, SpO2 100 %. Physical Exam Physical Examination: General appearance - obese, pale, shaking Mental status - agitated, anxious Eyes - L eye shrunken, lid closed, R eye DM retinopathy. Mouth - mucous membranes moist, pharynx normal without lesions Neck - adenopathy noted PCL Lymphatics - posterior cervical nodes Chest - decreased bs, rales in bases, R IJ PC Heart - S1 and S2 normal, systolic murmur XL2/4 at 2nd left intercostal space Abdomen - massive, liver down 6 cm Extremities - pedal edema 2-3+, AVF LUA , dressing L wrist, R ankle Skin -pale, few moles, abdm striae, stasis changes LE Assessment/Plan: 1 NONADHERENCE primary issue, needs  support 2 ESRD: will do HD but short as appears quite uremic, repeat in am.  Vol xs. Counseled. Need to limit diet 3 Hypertension: vol xs. 4. Anemia of ESRD: will check Fe 5. Metabolic Bone Disease: check PTH and phos 6 Bipolar 7 massive obesity 8 DM needs good control 9 Ankle fx.per Ortho 10 CTS 11 OSA P HD, short, slow, remove vol.  Time meds, diet.  Control bs, mask  Martha [name] 11/30/2018, 8:04 AM

## 2018-11-30 NOTE — Procedures (Signed)
    HEMODIALYSIS TREATMENT NOTE:  3 hour dialysis session completed via right chest wall tunneled catheter. Exit site unremarkable.  Goal nearly met: 2.9 liters removed.  UF rate was decreased once in response to declining SBP.  All blood was returned.  HD treatment planned for tomorrow to get patient back on MWF schedule.  Rockwell Alexandria, RN

## 2018-11-30 NOTE — TOC Initial Note (Signed)
Transition of Care [facility]) - Initial/Assessment Note    Patient Details  Name: Martha [name] MRN: [id number] Date of Birth: Feb 14, 1966  Transition of Care [facility]) CM/SW Contact:    Sherald Barge, RN Phone Number: 11/30/2018, 11:29 AM  Clinical Narrative:     Pt from home with fiance. Pt requires assistance at baseline for ADL's. She sues [facility]W or [facility] for mobility. She goes to OP HD at [facility] in [place] MWF. She has carpel tunnel surgery Thursday before last. She missed HD the following day d/t pain. She went to HD the following Mon and Wed but missed Friday of last week. Pt has no broke her foot. She wants to see surgeon in [place]. She reports being told by dialysis center she needs a hoyer lift to come back because she is unable to transfer on one leg d/t her size. Pt has concerns about transportation to and from HD. Pt has concerns about her fiance being able to care for her as he is "a little bitty thing". CM discussed options including going home vs a nursing home while she heals. Pt would like to return home if at all possible.   CM has contacted [place] [facility] center to clarify, they have a lift but pt will need her own lift pad, she is unable to afford one and they recommended getting an order to see if medicaid would pay for it. They have also sent referral to [facility] for transportation. [facility] needs to be notified when pt's next HD treatment is and they will provide WC transport.   CM has requested order for hoyer lift for pt's home. Pt has chosen [employer] as DME provider. Referral will be completed after signed order is received.   Pt should have [facility] for f/u after DC as pt is high risk for readmission d/t multiple barriers. She has had HH in the past but does not remember who. She does not have a preference of provider. No HH referral has been made at this time.  Ortho consult pending. TOC will cont to follow for DC planning needs.               Expected Discharge Plan:  [place]     Patient Goals and CMS Choice Patient states their goals for this hospitalization and ongoing recovery are:: be able to get back and forth to HD CMS Medicare.gov Compare Post Acute Care list provided to:: Patient Choice offered to / list presented to : Patient  Expected Discharge Plan and Services Expected Discharge Plan: [place] Choice: Durable Medical Equipment, Home Health Living arrangements for the past 2 months: Single Family Home                 DME Arranged: Other see comment(hoyer lift) DME Agency: [facility]       HH Arranged: RN          Prior Living Arrangements/Services Living arrangements for the past 2 months: [place] with:: Significant Other Patient language and need for interpreter reviewed:: Yes Do you feel safe going back to the place where you live?: Yes      Need for Family Participation in Patient Care: Yes (Comment) Care giver support system in place?: Yes (comment) Current home services: DME Criminal Activity/Legal Involvement Pertinent to Current Situation/Hospitalization: No - Comment as needed  Activities of Daily Living Home Assistive Devices/Equipment: Wheelchair ADL Screening (condition  at time of admission) Patient's cognitive ability adequate to safely complete daily activities?: Yes Is the patient deaf or have difficulty hearing?: No Does the patient have difficulty seeing, even when wearing glasses/contacts?: No Does the patient have difficulty concentrating, remembering, or making decisions?: No Patient able to express need for assistance with ADLs?: No Does the patient have difficulty dressing or bathing?: Yes Independently performs ADLs?: No Communication: Independent Dressing (OT): Needs assistance Is this a change from baseline?: Pre-admission baseline Grooming: Needs assistance Is this a change from baseline?: Pre-admission  baseline Feeding: Independent Bathing: Needs assistance Is this a change from baseline?: Pre-admission baseline Toileting: Needs assistance Is this a change from baseline?: Pre-admission baseline In/Out Bed: Needs assistance Is this a change from baseline?: Pre-admission baseline Walks in Home: Dependent Is this a change from baseline?: Change from baseline, expected to last <3 days Does the patient have difficulty walking or climbing stairs?: Yes Weakness of Legs: Right Weakness of Arms/Hands: Left  Permission Sought/Granted Permission sought to share information with : Other (comment) Permission granted to share information with : Yes, Verbal Permission Granted     Permission granted to share info w AGENCY: [facility]        Emotional Assessment Appearance:: Appears older than stated age Attitude/Demeanor/Rapport: Engaged Affect (typically observed): Calm, Appropriate Orientation: : Oriented to Self, Oriented to  Time, Oriented to Place, Oriented to Situation      Admission diagnosis:  Hyperkalemia [E87.5] Uremia [N19] Patient Active Problem List   Diagnosis Date Noted  . Metabolic encephalopathy 76/72/0947  . SIRS (systemic inflammatory response syndrome) ([place]) 06/23/2018  . UTI (urinary tract infection) 02/26/2018  . Volume overload 02/25/2018  . 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 Dialysis patient ([place]) 12/13/2017  . ESRD (end stage renal disease) on dialysis ([place])   . Chronic diarrhea 10/08/2017  . Renal failure (ARF), acute on chronic (HCC) 04/22/2017  . Anasarca associated with disorder of kidney 04/20/2017  . Protein-calorie malnutrition, severe 11/29/2016  . HTN (hypertension), malignant 11/20/2016  . Bipolar 1 disorder ([place]) 11/20/2016  . Noncompliance with medication regimen 11/20/2016  . Panic attack 11/20/2016  . Uncontrolled type 2 diabetes mellitus with hyperglycemia, without long-term current use of insulin ([place]) 10/27/2016  . Sensory  disturbance 10/27/2016  . Left hemiparesis ([place]) 10/27/2016  . Cholelithiasis 04/26/2014  . Hyponatremia 04/26/2014  . Gallstones 04/06/2014  . Diabetic ulcer of left great toe ([place]) 10/09/2013  . Type 2 diabetes mellitus treated with insulin ([place]) 05/30/2013  . Hyperglycemia 05/30/2013  . Internal hemorrhoids with other complication 09/62/8366  . Umbilical hernia without mention of obstruction or gangrene 04/15/2013  . Anemia 04/15/2013  . DM 05/13/2010  . Hyperlipidemia 05/13/2010  . Morbid obesity ([place]) 05/13/2010  . Depression with anxiety 05/13/2010  . RESTLESS LEG SYNDROME 05/13/2010  . Essential hypertension 05/13/2010  . GERD 05/13/2010  . RECTAL BLEEDING 05/13/2010  . Sleep apnea 05/13/2010  . Dysphagia, oropharyngeal phase 05/13/2010   PCP:  Sinda Du, MD Pharmacy:   [facility], [place] [address] Phone: [phone] Fax: [place] [facility], [place] [address] [place] [address] Phone: [phone] Fax: [phone]

## 2018-11-30 NOTE — Progress Notes (Signed)
Subjective: This is a 53 year old who called me yesterday stating that she had missed dialysis since last week and that she was getting twitchy and felt like she might need to be in the hospital.  She has a complicated situation with having had carpal tunnel surgery last week and then she started to get up yesterday and fell and has a trimalleolar fracture of her right ankle.  She was taken to the [facility] emergency department evaluated by orthopedics and set up for follow-up in the fracture was splinted.  When she called me stating that she was having more problems it sounded like she was uremic I had her come to the emergency department here and she was subsequently admitted.  She has missed dialysis for approximately a week.  Objective: Vital signs in last 24 hours: Temp:  [98.4 F (36.9 C)] 98.4 F (36.9 C) (05/05 0530) Pulse Rate:  [73-90] 89 (05/05 0530) Resp:  [10-31] 22 (05/05 0530) BP: (133-193)/(64-101) 193/96 (05/05 0530) SpO2:  [84 %-100 %] 100 % (05/05 0530) Weight:  [158.7 kg-168.1 kg] 168.1 kg (05/05 0456) Weight change:     Intake/Output from previous day: 05/04 0701 - 05/05 0700 In: 480 [P.O.:480] Out: -   PHYSICAL EXAM General appearance: She is alert but she is a little confused still Resp: clear to auscultation bilaterally Cardio: regular rate and rhythm, S1, S2 normal, no murmur, click, rub or gallop GI: soft, non-tender; bowel sounds normal; no masses,  no organomegaly Extremities: Her left wrist is wrapped in a splint and her right ankle is in a splint  Lab Results:  Results for orders placed or performed during the hospital encounter of 11/29/18 (from the past 48 hour(s))  CBG monitoring, ED     Status: Abnormal   Collection Time: 11/29/18  7:20 PM  Result Value Ref Range   Glucose-Capillary 299 (H) 70 - 99 mg/dL   Comment 1 Notify RN    Comment 2 Document in Chart   CBC with Differential/Platelet     Status: Abnormal   Collection Time: 11/29/18  7:36 PM   Result Value Ref Range   WBC 6.3 4.0 - 10.5 K/uL   RBC 3.03 (L) 3.87 - 5.11 MIL/uL   Hemoglobin 8.5 (L) 12.0 - 15.0 g/dL   HCT 28.9 (L) 36.0 - 46.0 %   MCV 95.4 80.0 - 100.0 fL   MCH 28.1 26.0 - 34.0 pg   MCHC 29.4 (L) 30.0 - 36.0 g/dL   RDW 16.8 (H) 11.5 - 15.5 %   Platelets 144 (L) 150 - 400 K/uL   nRBC 0.0 0.0 - 0.2 %   Neutrophils Relative % 81 %   Neutro Abs 5.1 1.7 - 7.7 K/uL   Lymphocytes Relative 14 %   Lymphs Abs 0.9 0.7 - 4.0 K/uL   Monocytes Relative 4 %   Monocytes Absolute 0.3 0.1 - 1.0 K/uL   Eosinophils Relative 1 %   Eosinophils Absolute 0.1 0.0 - 0.5 K/uL   Basophils Relative 0 %   Basophils Absolute 0.0 0.0 - 0.1 K/uL   Immature Granulocytes 0 %   Abs Immature Granulocytes 0.02 0.00 - 0.07 K/uL    Comment: Performed at [facility], [address] [place], [place] [address]  Comprehensive metabolic panel     Status: Abnormal   Collection Time: 11/29/18  7:36 PM  Result Value Ref Range   Sodium 140 135 - 145 mmol/L   Potassium 6.3 (HH) 3.5 - 5.1 mmol/L    Comment: CRITICAL  RESULT CALLED TO, READ BACK BY AND VERIFIED WITH: PRUITT,G ON 11/29/18 AT 2050 BY LOY,C    Chloride 104 98 - 111 mmol/L   CO2 20 (L) 22 - 32 mmol/L   Glucose, Bld 318 (H) 70 - 99 mg/dL   BUN 114 (H) 6 - 20 mg/dL    Comment: RESULTS CONFIRMED BY MANUAL DILUTION   Creatinine, Ser 11.26 (H) 0.44 - 1.00 mg/dL   Calcium 9.1 8.9 - 10.3 mg/dL   Total Protein 7.4 6.5 - 8.1 g/dL   Albumin 3.3 (L) 3.5 - 5.0 g/dL   AST 14 (L) 15 - 41 U/L   ALT 5 0 - 44 U/L   Alkaline Phosphatase 93 38 - 126 U/L   Total Bilirubin 0.4 0.3 - 1.2 mg/dL   GFR calc non Af Amer 4 (L) >60 mL/min   GFR calc Af Amer 4 (L) >60 mL/min   Anion gap 16 (H) 5 - 15    Comment: Performed at [facility], [address], [place], [place] [address]  Magnesium     Status: None   Collection Time: 11/29/18  7:36 PM  Result Value Ref Range   Magnesium 2.0 1.7 - 2.4 mg/dL    Comment: Performed at [facility], [address],  [place], [place] [address]  Glucose, capillary     Status: Abnormal   Collection Time: 11/29/18 11:21 PM  Result Value Ref Range   Glucose-Capillary 227 (H) 70 - 99 mg/dL  Basic metabolic panel     Status: Abnormal   Collection Time: 11/30/18  5:39 AM  Result Value Ref Range   Sodium 144 135 - 145 mmol/L   Potassium 5.0 3.5 - 5.1 mmol/L    Comment: DELTA CHECK NOTED   Chloride 107 98 - 111 mmol/L   CO2 21 (L) 22 - 32 mmol/L   Glucose, Bld 157 (H) 70 - 99 mg/dL   BUN 116 (H) 6 - 20 mg/dL    Comment: RESULTS CONFIRMED BY MANUAL DILUTION   Creatinine, Ser 11.79 (H) 0.44 - 1.00 mg/dL   Calcium 9.4 8.9 - 10.3 mg/dL   GFR calc non Af Amer 3 (L) >60 mL/min   GFR calc Af Amer 4 (L) >60 mL/min   Anion gap 16 (H) 5 - 15    Comment: Performed at [facility], [address], [place], [place] [address]  Glucose, capillary     Status: Abnormal   Collection Time: 11/30/18  8:02 AM  Result Value Ref Range   Glucose-Capillary 147 (H) 70 - 99 mg/dL    ABGS No results for input(s): PHART, PO2ART, TCO2, HCO3 in the last 72 hours.  Invalid input(s): PCO2 CULTURES No results found for this or any previous visit (from the past 240 hour(s)). Studies/Results: Dg Ankle Complete Right  Result Date: 11/29/2018 CLINICAL DATA:  Fall this morning with swelling and bruising to the right ankle EXAM: RIGHT ANKLE - COMPLETE 3+ VIEW COMPARISON:  None FINDINGS: Oblique fracture through the distal fibular shaft. There is a transverse fracture through the medial malleolus. Widening of the medial clear space. Posterior malleolus fracture is present and retracted proximally. Located hindfoot.  Heel spurs. IMPRESSION: Trimalleolar fracture with widening of the medial clear space. Electronically Signed   By: Monte Fantasia M.D.   On: 11/29/2018 07:28   Ct Head Wo Contrast  Result Date: 11/29/2018 CLINICAL DATA:  Blunt maxillofacial trauma EXAM: CT HEAD WITHOUT CONTRAST CT CERVICAL SPINE WITHOUT CONTRAST TECHNIQUE:  Multidetector CT imaging of the  head and cervical spine was performed following the standard protocol without intravenous contrast. Multiplanar CT image reconstructions of the cervical spine were also generated. COMPARISON:  Head CT 11/28/2018.  Cervical spine CT 06/21/2015 FINDINGS: CT HEAD FINDINGS Brain: No evidence of acute infarction, hemorrhage, hydrocephalus, extra-axial collection or mass lesion/mass effect. Vascular: No hyperdense vessel or unexpected calcification. Skull: Negative Sinuses/Orbits: Left nucleation with prosthesis. Right-sided oil tamponade. CT CERVICAL SPINE FINDINGS Alignment: Normal Skull base and vertebrae: Negative for acute fracture Soft tissues and spinal canal: No prevertebral fluid or swelling. No visible canal hematoma. 22 mm left thyroid nodule without worrisome growth since cervical spine CT in 2016. Disc levels:  No notable degenerative changes Upper chest: Negative IMPRESSION: No evidence of acute intracranial or cervical spine injury. Electronically Signed   By: Monte Fantasia M.D.   On: 11/29/2018 07:41   Ct Head Wo Contrast  Result Date: 11/28/2018 CLINICAL DATA:  Jerking and tremors that began today, end-stage renal disease on dialysis, CHF, type II diabetes mellitus, hypertension, COPD EXAM: CT HEAD WITHOUT CONTRAST TECHNIQUE: Contiguous axial images were obtained from the base of the skull through the vertex without intravenous contrast. Sagittal and coronal MPR images reconstructed from axial data set. COMPARISON:  09/26/2018 FINDINGS: Brain: Normal ventricular morphology. No midline shift or mass effect. Normal appearance of brain parenchyma. No intracranial hemorrhage, mass lesion or evidence of acute infarction. No extra-axial fluid collections. Vascular: No abnormalities Skull: Intact Sinuses/Orbits: Clear. 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 Chronic increased attenuation of the RIGHT optic globe again noted.  IMPRESSION: No acute intracranial mallet. Electronically Signed   By: Lavonia Dana M.D.   On: 11/28/2018 19:58   Ct Cervical Spine Wo Contrast  Result Date: 11/29/2018 CLINICAL DATA:  Blunt maxillofacial trauma EXAM: CT HEAD WITHOUT CONTRAST CT CERVICAL SPINE WITHOUT CONTRAST TECHNIQUE: Multidetector CT imaging of the head and cervical spine was performed following the standard protocol without intravenous contrast. Multiplanar CT image reconstructions of the cervical spine were also generated. COMPARISON:  Head CT 11/28/2018.  Cervical spine CT 06/21/2015 FINDINGS: CT HEAD FINDINGS Brain: No evidence of acute infarction, hemorrhage, hydrocephalus, extra-axial collection or mass lesion/mass effect. Vascular: No hyperdense vessel or unexpected calcification. Skull: Negative Sinuses/Orbits: Left nucleation with prosthesis. Right-sided oil tamponade. CT CERVICAL SPINE FINDINGS Alignment: Normal Skull base and vertebrae: Negative for acute fracture Soft tissues and spinal canal: No prevertebral fluid or swelling. No visible canal hematoma. 22 mm left thyroid nodule without worrisome growth since cervical spine CT in 2016. Disc levels:  No notable degenerative changes Upper chest: Negative IMPRESSION: No evidence of acute intracranial or cervical spine injury. Electronically Signed   By: Monte Fantasia M.D.   On: 11/29/2018 07:41   Dg Chest Port 1 View  Result Date: 11/29/2018 CLINICAL DATA:  Shortness of breath, weakness, history hypertension, CHF, COPD, former smoker EXAM: PORTABLE CHEST 1 VIEW COMPARISON:  Portable exam 1939 hours compared to 09/28/2018 FINDINGS: RIGHT jugular line with tip projecting over SVC. Enlargement of cardiac silhouette with pulmonary vascular congestion. Mediastinal contours normal. Lungs clear. No pleural effusion or pneumothorax. Bones unremarkable. IMPRESSION: Enlargement of cardiac silhouette with pulmonary vascular congestion. No acute infiltrate. Electronically Signed   By: Lavonia Dana M.D.   On: 11/29/2018 19:46    Medications:  Prior to Admission:  Medications Prior to Admission  Medication Sig Dispense Refill Last Dose  . albuterol (PROVENTIL) (2.5 MG/3ML) 0.083% nebulizer solution Take 2.5 mg by nebulization every  6 (six) hours as needed for wheezing or shortness of breath.    11/28/2018 at Unknown time  . ALPRAZolam (XANAX) 1 MG tablet Take 0.5 tablets (0.5 mg total) by mouth 2 (two) times daily as needed for anxiety (nerves). (Patient taking differently: Take 0.5 mg by mouth 4 (four) times daily. ) 30 tablet 0 11/28/2018 at Unknown time  . atorvastatin (LIPITOR) 20 MG tablet Take 20 mg by mouth daily.    11/28/2018 at Unknown time  . busPIRone (BUSPAR) 7.5 MG tablet Take 7.5 mg by mouth 2 (two) times daily.   11/28/2018 at Unknown time  . calcium carbonate (TUMS EX) 750 MG chewable tablet Chew 2-4 tablets by mouth See admin instructions. Patient takes 4 tablets with meals and 2 tablet with snacks three times a day   11/28/2018 at Unknown time  . cetirizine (ZYRTEC) 10 MG chewable tablet Chew 10 mg by mouth daily.   11/28/2018 at Unknown time  . cinacalcet (SENSIPAR) 30 MG tablet Take 30 mg by mouth daily.   11/28/2018 at Unknown time  . colchicine 0.6 MG tablet Take 1 tablet (0.6 mg total) by mouth every Tuesday, Thursday, and Saturday at 6 PM. Take 1 tablet every other day   11/27/2018 at Unknown time  . gabapentin (NEURONTIN) 300 MG capsule Take 300 mg by mouth at bedtime.   Past Week at Unknown time  . HYDROcodone-acetaminophen (NORCO) 10-325 MG tablet Take 1 tablet by mouth every 6 (six) hours as needed.   Past Week at Unknown time  . Insulin Glargine (LANTUS SOLOSTAR) 100 UNIT/ML Solostar Pen Inject 30 Units into the skin every morning. And pen needles 1/day 15 mL 11 11/28/2018 at Unknown time  . lidocaine-prilocaine (EMLA) cream Apply 1 application topically as needed (topical anesthesia for hemodialysis if Gebauers and Lidocaine injection are ineffective.). 30 g 0 Past Week at  Unknown time  . losartan (COZAAR) 25 MG tablet Take 25 mg by mouth daily.   11/28/2018 at Unknown time  . omeprazole (PRILOSEC) 40 MG capsule Take 40 mg by mouth daily.   11/28/2018 at Unknown time  . oxyCODONE-acetaminophen (PERCOCET/ROXICET) 5-325 MG tablet Take 1 tablet by mouth every 4 (four) hours as needed for severe pain. 15 tablet 0 Past Week at Unknown time  . PARoxetine (PAXIL) 20 MG tablet Take 1 daily. This is to prevent panic attacks (Patient taking differently: Take 20 mg by mouth daily. This is to prevent panic attacks) 30 tablet 12 11/28/2018 at Unknown time  . torsemide (DEMADEX) 100 MG tablet Take 100 mg by mouth 2 (two) times daily.    11/28/2018 at Unknown time  . oxyCODONE-acetaminophen (PERCOCET) 5-325 MG tablet Take 1 tablet by mouth every 4 (four) hours as needed for severe pain. (Patient not taking: Reported on 11/29/2018) 20 tablet 0 Not Taking at Unknown time   Scheduled: . atorvastatin  20 mg Oral Daily  . calcium carbonate  2 tablet Oral With snacks  . calcium carbonate  4 tablet Oral TID WC  . Chlorhexidine Gluconate Cloth  6 each Topical Q0600  . Chlorhexidine Gluconate Cloth  6 each Topical Q0600  . cinacalcet  30 mg Oral Q breakfast  . insulin aspart  0-9 Units Subcutaneous TID WC  . insulin glargine  10 Units Subcutaneous [id number]  . loratadine  10 mg Oral Daily  . losartan  25 mg Oral QHS  . multivitamin  1 tablet Oral QHS  . pantoprazole  40 mg Oral Daily   Continuous:  [id number] **  OR** acetaminophen, albuterol, ondansetron **OR** ondansetron (ZOFRAN) IV, polyethylene glycol, traMADol  Assesment: She was admitted with metabolic encephalopathy likely related to uremia.  She is not dialyzed in about a week.  She has end-stage renal disease on hemodialysis.  She has a relatively new problem which is trimalleolar fracture of her ankle.  She was evaluated in [place] but she requests potential surgery here if that is what she needs and if it can be done safely  here.  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  She has chronic pain which is worse since she has had the fracture  She has hypertension and has renal failure from that.  She has diabetes and renal failure from that.  She is on sliding scale and Lantus Active Problems:   Metabolic encephalopathy    Plan: Nephrology consultation appreciated.  She is to dialyze today and again tomorrow.  I have discussed her situation with Dr. Aline Brochure by phone and he is going to review her chart and see if she is a candidate for treatment here as far as her ankle fracture is concerned.    LOS: 0 days   Martha [name] 11/30/2018, 8:23 AM

## 2018-12-01 LAB — PARATHYROID HORMONE, INTACT (NO CA): PTH: 27 pg/mL (ref 15–65)

## 2018-12-01 LAB — CBC
HCT: 25.8 % — ABNORMAL LOW (ref 36.0–46.0)
Hemoglobin: 8 g/dL — ABNORMAL LOW (ref 12.0–15.0)
MCH: 28.6 pg (ref 26.0–34.0)
MCHC: 31 g/dL (ref 30.0–36.0)
MCV: 92.1 fL (ref 80.0–100.0)
Platelets: 137 10*3/uL — ABNORMAL LOW (ref 150–400)
RBC: 2.8 MIL/uL — ABNORMAL LOW (ref 3.87–5.11)
RDW: 16.9 % — ABNORMAL HIGH (ref 11.5–15.5)
WBC: 6 10*3/uL (ref 4.0–10.5)
nRBC: 0 % (ref 0.0–0.2)

## 2018-12-01 LAB — RENAL FUNCTION PANEL
Albumin: 2.8 g/dL — ABNORMAL LOW (ref 3.5–5.0)
Anion gap: 17 — ABNORMAL HIGH (ref 5–15)
BUN: 78 mg/dL — ABNORMAL HIGH (ref 6–20)
CO2: 26 mmol/L (ref 22–32)
Calcium: 9.5 mg/dL (ref 8.9–10.3)
Chloride: 97 mmol/L — ABNORMAL LOW (ref 98–111)
Creatinine, Ser: 9.48 mg/dL — ABNORMAL HIGH (ref 0.44–1.00)
GFR calc Af Amer: 5 mL/min — ABNORMAL LOW (ref >60)
GFR calc non Af Amer: 4 mL/min — ABNORMAL LOW (ref >60)
Glucose, Bld: 219 mg/dL — ABNORMAL HIGH (ref 70–99)
Phosphorus: 8.8 mg/dL — ABNORMAL HIGH (ref 2.5–4.6)
Potassium: 4.4 mmol/L (ref 3.5–5.1)
Sodium: 140 mmol/L (ref 135–145)

## 2018-12-01 LAB — GLUCOSE, CAPILLARY
Glucose-Capillary: 175 mg/dL — ABNORMAL HIGH (ref 70–99)
Glucose-Capillary: 175 mg/dL — ABNORMAL HIGH (ref 70–99)
Glucose-Capillary: 185 mg/dL — ABNORMAL HIGH (ref 70–99)
Glucose-Capillary: 189 mg/dL — ABNORMAL HIGH (ref 70–99)
Glucose-Capillary: 209 mg/dL — ABNORMAL HIGH (ref 70–99)

## 2018-12-01 LAB — HEPATITIS B SURFACE ANTIGEN: Hepatitis B Surface Ag: NEGATIVE

## 2018-12-01 MED ORDER — SODIUM CHLORIDE 0.9 % IV SOLN
510.0000 mg | Freq: Once | INTRAVENOUS | Status: DC
  Filled 2018-12-01: qty 17

## 2018-12-01 MED ORDER — CALCIUM CARBONATE ANTACID 500 MG PO CHEW
2.0000 | CHEWABLE_TABLET | ORAL | Status: DC
  Administered 2018-12-04 – 2018-12-07 (×2): 400 mg via ORAL

## 2018-12-01 MED ORDER — HEPARIN SODIUM (PORCINE) 1000 UNIT/ML DIALYSIS
40.0000 [IU]/kg | Freq: Once | INTRAMUSCULAR | Status: AC
  Administered 2018-12-01: 6700 [IU] via INTRAVENOUS_CENTRAL
  Filled 2018-12-01: qty 7

## 2018-12-01 MED ORDER — SODIUM CHLORIDE 0.9 % IV SOLN
510.0000 mg | Freq: Once | INTRAVENOUS | Status: AC
  Administered 2018-12-03: 510 mg via INTRAVENOUS
  Filled 2018-12-01: qty 17

## 2018-12-01 MED ORDER — CALCIUM CARBONATE ANTACID 500 MG PO CHEW
4.0000 | CHEWABLE_TABLET | Freq: Three times a day (TID) | ORAL | Status: DC
  Administered 2018-12-02 – 2018-12-08 (×13): 800 mg via ORAL
  Filled 2018-12-01 (×16): qty 4

## 2018-12-01 NOTE — Progress Notes (Signed)
Inpatient Diabetes Program Recommendations  AACE/ADA: New Consensus Statement on Inpatient Glycemic Control (2015)  Target Ranges:  Prepandial:   less than 140 mg/dL      Peak postprandial:   less than 180 mg/dL (1-2 hours)      Critically ill patients:  140 - 180 mg/dL   Lab Results  Component Value Date   GLUCAP 175 (H) 12/01/2018   HGBA1C 8.8 (A) 06/29/2018    Results for Martha, [name] (MRN [id number]) as of 12/01/2018 15:01  Ref. Range 11/30/2018 21:23 12/01/2018 00:14 12/01/2018 07:28 12/01/2018 11:04  Glucose-Capillary Latest Ref Range: 70 - 99 mg/dL 209 (H) 189 (H) 209 (H) 175 (H)     Review of Glycemic Control  Diabetes history: DM2 Outpatient Diabetes medications: Lantus 30 units daily  Current orders for Inpatient glycemic control: Lantus 10 units daily; Novolog sensitive (0-9 units) tid wc    Fasting CBG 209mg /dl this am. Would recommend increasing Lantus a small amount.     Md please consider following insulin adjustments:  Increase Lantus to 12 units daily    -- Will follow during hospitalization.--  Jonna Clark RN, MSN Diabetes Coordinator Inpatient Glycemic Control Team Team Pager: [phone] (8am-5pm)

## 2018-12-01 NOTE — NC FL2 (Signed)
[place] LEVEL OF CARE SCREENING TOOL     IDENTIFICATION  Patient Name: Martha [name] Birthdate: March 02, 1966 Sex: female Admission Date (Current Location): 11/29/2018  [place] and [place] Number:  [place] [id number] [place] and Address:  [facility] [address] [place]      Provider Number: [id number]  Attending Physician Name and Address:  Sinda Du, MD  Relative Name and Phone Number:  Junie Bame [phone]    Current Level of Care: Hospital Recommended Level of Care: [place] Prior Approval Number:    Date Approved/Denied:   PASRR Number: (pending)  Discharge Plan: SNF    Current Diagnoses: Patient Active Problem List   Diagnosis Date Noted  . Metabolic encephalopathy 17/49/4496  . SIRS (systemic inflammatory response syndrome) ([place]) 06/23/2018  . UTI (urinary tract infection) 02/26/2018  . Volume overload 02/25/2018  . 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 Dialysis patient ([place]) 12/13/2017  . ESRD (end stage renal disease) on dialysis ([place])   . Chronic diarrhea 10/08/2017  . Renal failure (ARF), acute on chronic (HCC) 04/22/2017  . Anasarca associated with disorder of kidney 04/20/2017  . Protein-calorie malnutrition, severe 11/29/2016  . HTN (hypertension), malignant 11/20/2016  . Bipolar 1 disorder ([place]) 11/20/2016  . Noncompliance with medication regimen 11/20/2016  . Panic attack 11/20/2016  . Uncontrolled type 2 diabetes mellitus with hyperglycemia, without long-term current use of insulin ([place]) 10/27/2016  . Sensory disturbance 10/27/2016  . Left hemiparesis ([place]) 10/27/2016  . Cholelithiasis 04/26/2014  . Hyponatremia 04/26/2014  . Gallstones 04/06/2014  . Diabetic ulcer of left great toe ([place]) 10/09/2013  . Type 2 diabetes mellitus treated with insulin ([place]) 05/30/2013  . Hyperglycemia 05/30/2013  . Internal hemorrhoids with other complication 75/91/6384  .  Umbilical hernia without mention of obstruction or gangrene 04/15/2013  . Anemia 04/15/2013  . DM 05/13/2010  . Hyperlipidemia 05/13/2010  . Morbid obesity ([place]) 05/13/2010  . Depression with anxiety 05/13/2010  . RESTLESS LEG SYNDROME 05/13/2010  . Essential hypertension 05/13/2010  . GERD 05/13/2010  . RECTAL BLEEDING 05/13/2010  . Sleep apnea 05/13/2010  . Dysphagia, oropharyngeal phase 05/13/2010    Orientation RESPIRATION BLADDER Height & Weight     Self, Time, Situation, Place  O2(2lpm and CPAP HS) Continent(oliguric) Weight: (!) 164.1 kg Height:  5\' 8"  (172.7 cm)  BEHAVIORAL SYMPTOMS/MOOD NEUROLOGICAL BOWEL NUTRITION STATUS  (n/a) (N/A) Continent Diet(renal carb modified; fluid resctriction of 1239ml)  AMBULATORY STATUS COMMUNICATION OF NEEDS Skin   Total Care Verbally Surgical wounds(incision left wrist; old surgical wound on umbilical area thats open to air but partially healed)                       Personal Care Assistance Level of Assistance  Bathing, Feeding, Dressing Bathing Assistance: Maximum assistance Feeding assistance: Independent Dressing Assistance: Maximum assistance     Functional Limitations Info  Sight, Hearing, Speech Sight Info: Impaired Hearing Info: Adequate Speech Info: Adequate    SPECIAL CARE FACTORS FREQUENCY  PT (By licensed PT)     PT Frequency: 5 days/week or as able              Contractures Contractures Info: Not present    Additional Factors Info  Code Status, Allergies, Psychotropic, Insulin Sliding Scale Code Status Info: full Allergies Info: codeine; tape Psychotropic Info: xanax, buspar, paxil Insulin Sliding Scale Info: see DC summary       Current Medications (  12/01/2018):  This is the current hospital active medication list Current Facility-Administered Medications  Medication Dose Route Frequency Provider Last Rate Last Dose  . 0.9 %  sodium chloride infusion  100 mL Intravenous PRN Deterding, Jeneen Rinks, MD       . 0.9 %  sodium chloride infusion  100 mL Intravenous PRN Deterding, Jeneen Rinks, MD      . acetaminophen (TYLENOL) tablet 650 mg  650 mg Oral Q6H PRN Emokpae, Ejiroghene E, MD       Or  . acetaminophen (TYLENOL) suppository 650 mg  650 mg Rectal Q6H PRN Emokpae, Ejiroghene E, MD      . albuterol (PROVENTIL) (2.5 MG/3ML) 0.083% nebulizer solution 2.5 mg  2.5 mg Nebulization Q6H PRN Emokpae, Ejiroghene E, MD   2.5 mg at 11/30/18 1320  . alteplase (CATHFLO ACTIVASE) injection 2 mg  2 mg Intracatheter Once PRN Mauricia Area, MD      . atorvastatin (LIPITOR) tablet 20 mg  20 mg Oral Daily Emokpae, Ejiroghene E, MD   20 mg at 12/01/18 1252  . calcium carbonate (TUMS - dosed in mg elemental calcium) chewable tablet 400 mg of elemental calcium  2 tablet Oral With snacks Emokpae, Ejiroghene E, MD      . calcium carbonate (TUMS - dosed in mg elemental calcium) chewable tablet 400 mg of elemental calcium  2 tablet Oral With snacks Sinda Du, MD      . calcium carbonate (TUMS - dosed in mg elemental calcium) chewable tablet 800 mg of elemental calcium  4 tablet Oral TID WC Sinda Du, MD      . Chlorhexidine Gluconate Cloth 2 % PADS 6 each  6 each Topical [id number] Mauricia Area, MD   6 each at 12/01/18 0535  . Chlorhexidine Gluconate Cloth 2 % PADS 6 each  6 each Topical [id number] Mauricia Area, MD   6 each at 12/01/18 0535  . cinacalcet (SENSIPAR) tablet 30 mg  30 mg Oral Q breakfast Emokpae, Ejiroghene E, MD   30 mg at 12/01/18 0812  . ferumoxytol (FERAHEME) 510 mg in sodium chloride 0.9 % 100 mL IVPB  510 mg Intravenous Once Sinda Du, MD      . heparin injection 3,000 Units  3,000 Units Dialysis PRN Deterding, Jeneen Rinks, MD      . HYDROcodone-acetaminophen (NORCO/VICODIN) 5-325 MG per tablet 1 tablet  1 tablet Oral Q6H PRN Sinda Du, MD   1 tablet at 12/01/18 [phone]  . insulin aspart (novoLOG) injection 0-9 Units  0-9 Units Subcutaneous TID WC Emokpae, Ejiroghene E, MD   3 Units at  12/01/18 0814  . insulin glargine (LANTUS) injection 10 Units  10 Units Subcutaneous Elyse Hsu, MD   10 Units at 12/01/18 [phone]  . loratadine (CLARITIN) tablet 10 mg  10 mg Oral Daily Emokpae, Ejiroghene E, MD   10 mg at 12/01/18 1252  . losartan (COZAAR) tablet 25 mg  25 mg Oral QHS Deterding, Jeneen Rinks, MD      . morphine 4 MG/ML injection 4 mg  4 mg Intravenous Q4H PRN Sinda Du, MD   4 mg at 12/01/18 1412  . multivitamin (RENA-VIT) tablet 1 tablet  1 tablet Oral Nile Riggs, MD   1 tablet at 11/30/18 2126  . ondansetron (ZOFRAN) tablet 4 mg  4 mg Oral Q6H PRN Emokpae, Ejiroghene E, MD       Or  . ondansetron (ZOFRAN) injection 4 mg  4 mg Intravenous Q6H PRN Emokpae, Ejiroghene E, MD      .  pantoprazole (PROTONIX) EC tablet 40 mg  40 mg Oral Daily Emokpae, Ejiroghene E, MD   40 mg at 12/01/18 1252  . pentafluoroprop-tetrafluoroeth (GEBAUERS) aerosol 1 application  1 application Topical PRN Deterding, Jeneen Rinks, MD      . polyethylene glycol (MIRALAX / GLYCOLAX) packet 17 g  17 g Oral Daily PRN Emokpae, Ejiroghene E, MD   17 g at 12/01/18 1412     Discharge Medications: Please see discharge summary for a list of discharge medications.  Relevant Imaging Results:  Relevant Lab Results:   Additional Information SSN [id number] ; ESRD on HD MWF at [facility] in [place], Margretta Sidle, RN

## 2018-12-01 NOTE — Procedures (Signed)
    HEMODIALYSIS TREATMENT NOTE:  3 hour dialysis session completed via right chest wall tunneled PC.  Goal NOT met: Ultrafiltration was interrupted x 15 minutes and UF rate was then lowered in response to SBP<100.  Net UF 2.8L.  All blood was returned.  Rockwell Alexandria, RN

## 2018-12-01 NOTE — TOC Progression Note (Signed)
Transition of Care [facility]) - Progression Note    Patient Details  Name: Martha [name] MRN: [id number] Date of Birth: 26-Feb-1966  Transition of Care [facility]) CM/SW Contact  Roda Shutters Margretta Sidle, RN Phone Number: 12/01/2018, 1:41 PM  Clinical Narrative:   Pt now agreeable to SNF. She has been to [facility] in the past but would like to stay in [place]. Permission given to fax to all 5 facilities in [facility]. CM will send out referral and cont to follow.     Expected Discharge Plan: [place]    Expected Discharge Plan and Services Expected Discharge Plan: [place] Choice: [place] arrangements for the past 2 months: [place]

## 2018-12-01 NOTE — Progress Notes (Signed)
Subjective: She says she feels okay.  She is having pain in her leg.  She did dialysis yesterday and she did well.  She is going to have dialysis again today.  Objective: Vital signs in last 24 hours: Temp:  [98.1 F (36.7 C)-98.5 F (36.9 C)] 98.2 F (36.8 C) (05/06 0641) Pulse Rate:  [78-88] 87 (05/06 0641) Resp:  [18-20] 20 (05/05 2121) BP: (98-159)/(48-66) 159/66 (05/06 0641) SpO2:  [75 %-100 %] 97 % (05/06 0653) Weight:  [165.4 kg-168.3 kg] 167 kg (05/06 0500) Weight change: 9.6 kg Last BM Date: (pt unable to state)  Intake/Output from previous day: 05/05 0701 - 05/06 0700 In: 360 [P.O.:360] Out: 4000 [Urine:1100]  PHYSICAL EXAM General appearance: alert, cooperative and morbidly obese Resp: clear to auscultation bilaterally Cardio: regular rate and rhythm, S1, S2 normal, no murmur, click, rub or gallop GI: soft, non-tender; bowel sounds normal; no masses,  no organomegaly Extremities: She has a splint on her right leg and on her left wrist  Lab Results:  Results for orders placed or performed during the hospital encounter of 11/29/18 (from the past 48 hour(s))  CBG monitoring, ED     Status: Abnormal   Collection Time: 11/29/18  7:20 PM  Result Value Ref Range   Glucose-Capillary 299 (H) 70 - 99 mg/dL   Comment 1 Notify RN    Comment 2 Document in Chart   CBC with Differential/Platelet     Status: Abnormal   Collection Time: 11/29/18  7:36 PM  Result Value Ref Range   WBC 6.3 4.0 - 10.5 K/uL   RBC 3.03 (L) 3.87 - 5.11 MIL/uL   Hemoglobin 8.5 (L) 12.0 - 15.0 g/dL   HCT 28.9 (L) 36.0 - 46.0 %   MCV 95.4 80.0 - 100.0 fL   MCH 28.1 26.0 - 34.0 pg   MCHC 29.4 (L) 30.0 - 36.0 g/dL   RDW 16.8 (H) 11.5 - 15.5 %   Platelets 144 (L) 150 - 400 K/uL   nRBC 0.0 0.0 - 0.2 %   Neutrophils Relative % 81 %   Neutro Abs 5.1 1.7 - 7.7 K/uL   Lymphocytes Relative 14 %   Lymphs Abs 0.9 0.7 - 4.0 K/uL   Monocytes Relative 4 %   Monocytes Absolute 0.3 0.1 - 1.0 K/uL    Eosinophils Relative 1 %   Eosinophils Absolute 0.1 0.0 - 0.5 K/uL   Basophils Relative 0 %   Basophils Absolute 0.0 0.0 - 0.1 K/uL   Immature Granulocytes 0 %   Abs Immature Granulocytes 0.02 0.00 - 0.07 K/uL    Comment: Performed at [facility], [address], [place], [place] [address]  Comprehensive metabolic panel     Status: Abnormal   Collection Time: 11/29/18  7:36 PM  Result Value Ref Range   Sodium 140 135 - 145 mmol/L   Potassium 6.3 (HH) 3.5 - 5.1 mmol/L    Comment: CRITICAL RESULT CALLED TO, READ BACK BY AND VERIFIED WITH: PRUITT,G ON 11/29/18 AT 2050 BY LOY,C    Chloride 104 98 - 111 mmol/L   CO2 20 (L) 22 - 32 mmol/L   Glucose, Bld 318 (H) 70 - 99 mg/dL   BUN 114 (H) 6 - 20 mg/dL    Comment: RESULTS CONFIRMED BY MANUAL DILUTION   Creatinine, Ser 11.26 (H) 0.44 - 1.00 mg/dL   Calcium 9.1 8.9 - 10.3 mg/dL   Total Protein 7.4 6.5 - 8.1 g/dL   Albumin 3.3 (L) 3.5 - 5.0 g/dL  AST 14 (L) 15 - 41 U/L   ALT 5 0 - 44 U/L   Alkaline Phosphatase 93 38 - 126 U/L   Total Bilirubin 0.4 0.3 - 1.2 mg/dL   GFR calc non Af Amer 4 (L) >60 mL/min   GFR calc Af Amer 4 (L) >60 mL/min   Anion gap 16 (H) 5 - 15    Comment: Performed at [facility], [address] [place], [place] [address]  Magnesium     Status: None   Collection Time: 11/29/18  7:36 PM  Result Value Ref Range   Magnesium 2.0 1.7 - 2.4 mg/dL    Comment: Performed at [facility], [address], [place], [place] [address]  Glucose, capillary     Status: Abnormal   Collection Time: 11/29/18 11:21 PM  Result Value Ref Range   Glucose-Capillary 227 (H) 70 - 99 mg/dL  MRSA PCR Screening     Status: None   Collection Time: 11/30/18  2:05 AM  Result Value Ref Range   MRSA by PCR NEGATIVE NEGATIVE    Comment:        The GeneXpert MRSA Assay (FDA approved for NASAL specimens only), is one component of a comprehensive MRSA colonization surveillance program. It is not intended to diagnose MRSA infection nor to guide  or monitor treatment for MRSA infections. Performed at [facility], [address] [place], [place] [address]   Basic metabolic panel     Status: Abnormal   Collection Time: 11/30/18  5:39 AM  Result Value Ref Range   Sodium 144 135 - 145 mmol/L   Potassium 5.0 3.5 - 5.1 mmol/L    Comment: DELTA CHECK NOTED   Chloride 107 98 - 111 mmol/L   CO2 21 (L) 22 - 32 mmol/L   Glucose, Bld 157 (H) 70 - 99 mg/dL   BUN 116 (H) 6 - 20 mg/dL    Comment: RESULTS CONFIRMED BY MANUAL DILUTION   Creatinine, Ser 11.79 (H) 0.44 - 1.00 mg/dL   Calcium 9.4 8.9 - 10.3 mg/dL   GFR calc non Af Amer 3 (L) >60 mL/min   GFR calc Af Amer 4 (L) >60 mL/min   Anion gap 16 (H) 5 - 15    Comment: Performed at [facility], [address], [place], [place] [address]  Glucose, capillary     Status: Abnormal   Collection Time: 11/30/18  8:02 AM  Result Value Ref Range   Glucose-Capillary 147 (H) 70 - 99 mg/dL  Glucose, capillary     Status: Abnormal   Collection Time: 11/30/18 11:20 AM  Result Value Ref Range   Glucose-Capillary 162 (H) 70 - 99 mg/dL  Hepatitis B surface antigen     Status: None   Collection Time: 11/30/18  2:04 PM  Result Value Ref Range   Hepatitis B Surface Ag Negative Negative    Comment: (NOTE) Performed At: [facility] [place], [place] [id number] Rush Farmer MD [id number]   Iron and TIBC     Status: Abnormal   Collection Time: 11/30/18  2:04 PM  Result Value Ref Range   Iron 23 (L) 28 - 170 ug/dL   TIBC 230 (L) 250 - 450 ug/dL   Saturation Ratios 10 (L) 10.4 - 31.8 %   UIBC 207 ug/dL    Comment: Performed at [facility], [address], [place], [place] [address]  Parathyroid hormone, intact (no Ca)     Status: None   Collection Time: 11/30/18  2:04 PM  Result Value Ref Range   PTH 27 15 - 65 pg/mL    Comment: (NOTE) Performed At: [facility] [place], [place] [id number] Rush Farmer MD [id number]   Glucose, capillary      Status: Abnormal   Collection Time: 11/30/18  4:14 PM  Result Value Ref Range   Glucose-Capillary 217 (H) 70 - 99 mg/dL  Glucose, capillary     Status: Abnormal   Collection Time: 11/30/18  9:23 PM  Result Value Ref Range   Glucose-Capillary 209 (H) 70 - 99 mg/dL   Comment 1 Notify RN    Comment 2 Document in Chart   Glucose, capillary     Status: Abnormal   Collection Time: 12/01/18 12:14 AM  Result Value Ref Range   Glucose-Capillary 189 (H) 70 - 99 mg/dL  Glucose, capillary     Status: Abnormal   Collection Time: 12/01/18  7:28 AM  Result Value Ref Range   Glucose-Capillary 209 (H) 70 - 99 mg/dL    ABGS No results for input(s): PHART, PO2ART, TCO2, HCO3 in the last 72 hours.  Invalid input(s): PCO2 CULTURES Recent Results (from the past 240 hour(s))  MRSA PCR Screening     Status: None   Collection Time: 11/30/18  2:05 AM  Result Value Ref Range Status   MRSA by PCR NEGATIVE NEGATIVE Final    Comment:        The GeneXpert MRSA Assay (FDA approved for NASAL specimens only), is one component of a comprehensive MRSA colonization surveillance program. It is not intended to diagnose MRSA infection nor to guide or monitor treatment for MRSA infections. Performed at [facility], [address], [place],  [address]    Studies/Results: Dg Chest Port 1 View  Result Date: 11/29/2018 CLINICAL DATA:  Shortness of breath, weakness, history hypertension, CHF, COPD, former smoker EXAM: PORTABLE CHEST 1 VIEW COMPARISON:  Portable exam 1939 hours compared to 09/28/2018 FINDINGS: RIGHT jugular line with tip projecting over SVC. Enlargement of cardiac silhouette with pulmonary vascular congestion. Mediastinal contours normal. Lungs clear. No pleural effusion or pneumothorax. Bones unremarkable. IMPRESSION: Enlargement of cardiac silhouette with pulmonary vascular congestion. No acute infiltrate. Electronically Signed   By: Lavonia Dana M.D.   On: 11/29/2018 19:46     Medications:  Prior to Admission:  Medications Prior to Admission  Medication Sig Dispense Refill Last Dose  . albuterol (PROVENTIL) (2.5 MG/3ML) 0.083% nebulizer solution Take 2.5 mg by nebulization every 6 (six) hours as needed for wheezing or shortness of breath.    11/28/2018 at Unknown time  . ALPRAZolam (XANAX) 1 MG tablet Take 0.5 tablets (0.5 mg total) by mouth 2 (two) times daily as needed for anxiety (nerves). (Patient taking differently: Take 0.5 mg by mouth 4 (four) times daily. ) 30 tablet 0 11/28/2018 at Unknown time  . atorvastatin (LIPITOR) 20 MG tablet Take 20 mg by mouth daily.    11/28/2018 at Unknown time  . busPIRone (BUSPAR) 7.5 MG tablet Take 7.5 mg by mouth 2 (two) times daily.   11/28/2018 at Unknown time  . calcium carbonate (TUMS EX) 750 MG chewable tablet Chew 2-4 tablets by mouth See admin instructions. Patient takes 4 tablets with meals and 2 tablet with snacks three times a day   11/28/2018 at Unknown time  . cetirizine (ZYRTEC) 10 MG chewable tablet Chew 10 mg by mouth daily.   11/28/2018 at Unknown time  . cinacalcet (SENSIPAR) 30 MG tablet Take 30 mg by  mouth daily.   11/28/2018 at Unknown time  . colchicine 0.6 MG tablet Take 1 tablet (0.6 mg total) by mouth every Tuesday, Thursday, and Saturday at 6 PM. Take 1 tablet every other day   11/27/2018 at Unknown time  . gabapentin (NEURONTIN) 300 MG capsule Take 300 mg by mouth at bedtime.   Past Week at Unknown time  . HYDROcodone-acetaminophen (NORCO) 10-325 MG tablet Take 1 tablet by mouth every 6 (six) hours as needed.   Past Week at Unknown time  . Insulin Glargine (LANTUS SOLOSTAR) 100 UNIT/ML Solostar Pen Inject 30 Units into the skin every morning. And pen needles 1/day 15 mL 11 11/28/2018 at Unknown time  . lidocaine-prilocaine (EMLA) cream Apply 1 application topically as needed (topical anesthesia for hemodialysis if Gebauers and Lidocaine injection are ineffective.). 30 g 0 Past Week at Unknown time  . losartan (COZAAR)  25 MG tablet Take 25 mg by mouth daily.   11/28/2018 at Unknown time  . omeprazole (PRILOSEC) 40 MG capsule Take 40 mg by mouth daily.   11/28/2018 at Unknown time  . oxyCODONE-acetaminophen (PERCOCET/ROXICET) 5-325 MG tablet Take 1 tablet by mouth every 4 (four) hours as needed for severe pain. 15 tablet 0 Past Week at Unknown time  . PARoxetine (PAXIL) 20 MG tablet Take 1 daily. This is to prevent panic attacks (Patient taking differently: Take 20 mg by mouth daily. This is to prevent panic attacks) 30 tablet 12 11/28/2018 at Unknown time  . torsemide (DEMADEX) 100 MG tablet Take 100 mg by mouth 2 (two) times daily.    11/28/2018 at Unknown time  . oxyCODONE-acetaminophen (PERCOCET) 5-325 MG tablet Take 1 tablet by mouth every 4 (four) hours as needed for severe pain. (Patient not taking: Reported on 11/29/2018) 20 tablet 0 Not Taking at Unknown time   Scheduled: . atorvastatin  20 mg Oral Daily  . calcium carbonate  2 tablet Oral With snacks  . calcium carbonate  2 tablet Oral With snacks  . calcium carbonate  4 tablet Oral TID WC  . Chlorhexidine Gluconate Cloth  6 each Topical Q0600  . Chlorhexidine Gluconate Cloth  6 each Topical Q0600  . cinacalcet  30 mg Oral Q breakfast  . insulin aspart  0-9 Units Subcutaneous TID WC  . insulin glargine  10 Units Subcutaneous BH-q7a  . loratadine  10 mg Oral Daily  . losartan  25 mg Oral QHS  . multivitamin  1 tablet Oral QHS  . pantoprazole  40 mg Oral Daily   Continuous: . sodium chloride    . sodium chloride     RKY:HCWCBJ chloride, sodium chloride, acetaminophen **OR** acetaminophen, albuterol, alteplase, heparin, HYDROcodone-acetaminophen, morphine, ondansetron **OR** ondansetron (ZOFRAN) IV, pentafluoroprop-tetrafluoroeth, polyethylene glycol  Assesment: She was admitted with uremia.  She missed dialysis multiple times.  She had metabolic encephalopathy and she is better.  She has a trimalleolar fracture of her right ankle.  She is complaining about  difficulty with transfers.  We discussed skilled care facility placement.  Apparently she had discussed that with case management yesterday and told them that she did not think she could do it because it would require her to give up her check.  Today when I talked to her about it she did not mention any of that so I am not sure if she was confused or what.  Case management will discuss with her again today. Active Problems:   Morbid obesity (HCC)   RESTLESS LEG SYNDROME   Essential hypertension  Sleep apnea   Type 2 diabetes mellitus treated with insulin (HCC)   ESRD (end stage renal disease) on dialysis [facility])   History of enucleation of left eyeball   Metabolic encephalopathy    Plan: Dialysis today.  Discussion with case management today.  Probably home in the morning    LOS: 1 day   Martha [name] 12/01/2018, 8:32 AM

## 2018-12-01 NOTE — Progress Notes (Signed)
Patient ID: Martha [name], female   DOB: 08-28-1965, 53 y.o.   MRN: [id number]  [facility] Progress Note    Subjective:   Complaining of ankle/foot pain    Objective:   BP (!) 159/66 (BP Location: Right Wrist)   Pulse 87   Temp 98.2 F (36.8 C) (Oral)   Resp 20   Ht 5\' 8"  (1.727 m)   Wt (!) 167 kg   LMP 08/03/2008 (Exact Date)   SpO2 97%   BMI 55.98 kg/m   Intake/Output: I/O last 3 completed shifts: In: 840 [P.O.:840] Out: 4000 [Urine:1100; Other:2900]   Intake/Output this shift:  No intake/output data recorded. Weight change: 9.6 kg  Physical Exam: Gen: morbidly obese WF in NAD CVS: no rub Resp: occ rhonchi Abd: obese, +BS, soft Ext: right ankle edema and pain, 1+ edema of lower extremities  Labs: BMET Recent Labs  Lab 11/25/18 1117 11/28/18 1736 11/29/18 1009 11/29/18 1936 11/30/18 0539  NA 138 141 141 140 144  K 4.3 5.7* 5.8* 6.3* 5.0  CL  --  105 105 104 107  CO2  --  20* 20* 20* 21*  GLUCOSE 246* 314* 225* 318* 157*  BUN  --  108* 112* 114* 116*  CREATININE  --  10.56* 11.16* 11.26* 11.79*  ALBUMIN  --   --   --  3.3*  --   CALCIUM  --  9.2 9.3 9.1 9.4   CBC Recent Labs  Lab 11/25/18 1117 11/28/18 1736 11/29/18 1936  WBC  --  5.2 6.3  NEUTROABS  --  3.6 5.1  HGB 9.9* 8.9* 8.5*  HCT 29.0* 28.6* 28.9*  MCV  --  92.0 95.4  PLT  --  136* 144*    @IMGRELPRIORS @ Medications:    . atorvastatin  20 mg Oral Daily  . calcium carbonate  2 tablet Oral With snacks  . calcium carbonate  2 tablet Oral With snacks  . calcium carbonate  4 tablet Oral TID WC  . Chlorhexidine Gluconate Cloth  6 each Topical Q0600  . Chlorhexidine Gluconate Cloth  6 each Topical Q0600  . cinacalcet  30 mg Oral Q breakfast  . heparin  40 Units/kg Dialysis Once in dialysis  . insulin aspart  0-9 Units Subcutaneous TID WC  . insulin glargine  10 Units Subcutaneous BH-q7a  . loratadine  10 mg Oral Daily  . losartan  25 mg Oral QHS  . multivitamin  1  tablet Oral QHS  . pantoprazole  40 mg Oral Daily     Assessment/ Plan:   1. Uremia- due to noncompliance with HD.  improving 2. ESRD back on schedule for MWF.  Plan for HD again on Friday for regular session. 3. Anemia: drop in Hgb.  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 Nutrition: renal diet 6. Hypertension: stable 7. Hyperkalemia- improved with serial HD 8. right ankle fracture- will need ortho/surgery but not able to happen for a week.  She is having issues with transferring and would benefit from SNF placement and rehab pre and post surgery.  Case management has been involved.  She is more amenable today to consider this.   Donetta Potts, MD [place] Pager [phone] 12/01/2018, 9:45 AM

## 2018-12-02 LAB — RENAL FUNCTION PANEL
Albumin: 2.9 g/dL — ABNORMAL LOW (ref 3.5–5.0)
Anion gap: 13 (ref 5–15)
BUN: 52 mg/dL — ABNORMAL HIGH (ref 6–20)
CO2: 28 mmol/L (ref 22–32)
Calcium: 9.3 mg/dL (ref 8.9–10.3)
Chloride: 97 mmol/L — ABNORMAL LOW (ref 98–111)
Creatinine, Ser: 7.09 mg/dL — ABNORMAL HIGH (ref 0.44–1.00)
GFR calc Af Amer: 7 mL/min — ABNORMAL LOW (ref >60)
GFR calc non Af Amer: 6 mL/min — ABNORMAL LOW (ref >60)
Glucose, Bld: 190 mg/dL — ABNORMAL HIGH (ref 70–99)
Phosphorus: 8.2 mg/dL — ABNORMAL HIGH (ref 2.5–4.6)
Potassium: 4.5 mmol/L (ref 3.5–5.1)
Sodium: 138 mmol/L (ref 135–145)

## 2018-12-02 LAB — GLUCOSE, CAPILLARY
Glucose-Capillary: 169 mg/dL — ABNORMAL HIGH (ref 70–99)
Glucose-Capillary: 202 mg/dL — ABNORMAL HIGH (ref 70–99)
Glucose-Capillary: 141 mg/dL — ABNORMAL HIGH (ref 70–99)
Glucose-Capillary: 155 mg/dL — ABNORMAL HIGH (ref 70–99)

## 2018-12-02 MED ORDER — INSULIN ASPART 100 UNIT/ML ~~LOC~~ SOLN: 0.0000 [IU] | Freq: Three times a day (TID) | SUBCUTANEOUS | 11 refills | Status: DC

## 2018-12-02 MED ORDER — INSULIN GLARGINE 100 UNIT/ML ~~LOC~~ SOLN
12.0000 [IU] | SUBCUTANEOUS | Status: DC
  Administered 2018-12-02 – 2018-12-08 (×7): 12 [IU] via SUBCUTANEOUS
  Filled 2018-12-02 (×8): qty 0.12

## 2018-12-02 MED ORDER — ACETAMINOPHEN 325 MG PO TABS: 650.0000 mg | ORAL_TABLET | Freq: Four times a day (QID) | ORAL | Status: DC | PRN

## 2018-12-02 MED ORDER — ALPRAZOLAM 1 MG PO TABS: 0.5000 mg | ORAL_TABLET | Freq: Four times a day (QID) | ORAL | Status: DC

## 2018-12-02 MED ORDER — DOXERCALCIFEROL 4 MCG/2ML IV SOLN
1.0000 ug | INTRAVENOUS | Status: DC
  Administered 2018-12-03: 1 ug via INTRAVENOUS
  Filled 2018-12-02: qty 2

## 2018-12-02 MED ORDER — ALUM & MAG HYDROXIDE-SIMETH 200-200-20 MG/5ML PO SUSP
30.0000 mL | ORAL | Status: DC | PRN
  Administered 2018-12-02: 30 mL via ORAL
  Filled 2018-12-02: qty 30

## 2018-12-02 MED ORDER — BISACODYL 5 MG PO TBEC
10.0000 mg | DELAYED_RELEASE_TABLET | Freq: Every day | ORAL | Status: DC | PRN
  Administered 2018-12-02: 10 mg via ORAL
  Filled 2018-12-02: qty 2

## 2018-12-02 MED ORDER — POLYETHYLENE GLYCOL 3350 17 G PO PACK: 17.0000 g | PACK | Freq: Every day | ORAL | 0 refills | Status: DC | PRN

## 2018-12-02 MED ORDER — EPOETIN ALFA 10000 UNIT/ML IJ SOLN
10000.0000 [IU] | INTRAMUSCULAR | Status: DC
  Administered 2018-12-03 – 2018-12-08 (×3): 10000 [IU] via INTRAVENOUS
  Filled 2018-12-02 (×4): qty 1

## 2018-12-02 MED ORDER — ONDANSETRON HCL 4 MG PO TABS: 4.0000 mg | ORAL_TABLET | Freq: Four times a day (QID) | ORAL | 0 refills | Status: DC | PRN

## 2018-12-02 NOTE — TOC Progression Note (Signed)
Transition of Care [facility]) - Progression Note    Patient Details  Name: Martha [name] MRN: [id number] Date of Birth: Feb 21, 1966  Transition of Care [facility]) CM/SW Contact  Roda Shutters Margretta Sidle, RN Phone Number: 12/02/2018, 3:38 PM  Clinical Narrative:  No bed offers at this time. Both [place] and [place] are working to get approval from their administration. Pt will not be able to DC today. TOC will cont to follow.     Expected Discharge Plan: Skilled Nursing Facility Barriers to Discharge: No SNF bed  Expected Discharge Plan and Services Expected Discharge Plan: [place] Choice: [place] arrangements for the past 2 months: Single Family Home Expected Discharge Date: 12/02/18

## 2018-12-02 NOTE — Progress Notes (Signed)
Patient ID: Martha [name], female   DOB: 28-Feb-1966, 53 y.o.   MRN: [id number] S: Feels a little better and is amenable to SNF until she can undergo surgery on her right ankle O:BP 140/70 (BP Location: Right Arm)   Pulse 79   Temp 98.4 F (36.9 C) (Oral)   Resp 16   Ht 5\' 8"  (1.727 m)   Wt (!) 164 kg   LMP 08/03/2008 (Exact Date)   SpO2 99%   BMI 54.97 kg/m   Intake/Output Summary (Last 24 hours) at 12/02/2018 1023 Last data filed at 12/01/2018 1935 Gross per 24 hour  Intake 240 ml  Output 3655 ml  Net -3415 ml   Intake/Output: I/O last 3 completed shifts: In: 600 [P.O.:600] Out: 2202 [Urine:500; Emesis/NG output:800; Other:2855]  Intake/Output this shift:  No intake/output data recorded. Weight change: -1.1 kg Gen: obese, WF in NAD CVS: no rub Resp: cta Abd: benign Ext: LUE AVF +TB  Recent Labs  Lab 11/25/18 1117 11/28/18 1736 11/29/18 1009 11/29/18 1936 11/30/18 0539 12/01/18 1005 12/02/18 0550  NA 138 141 141 140 144 140 138  K 4.3 5.7* 5.8* 6.3* 5.0 4.4 4.5  CL  --  105 105 104 107 97* 97*  CO2  --  20* 20* 20* 21* 26 28  GLUCOSE 246* 314* 225* 318* 157* 219* 190*  BUN  --  108* 112* 114* 116* 78* 52*  CREATININE  --  10.56* 11.16* 11.26* 11.79* 9.48* 7.09*  ALBUMIN  --   --   --  3.3*  --  2.8* 2.9*  CALCIUM  --  9.2 9.3 9.1 9.4 9.5 9.3  PHOS  --   --   --   --   --  8.8* 8.2*  AST  --   --   --  14*  --   --   --   ALT  --   --   --  5  --   --   --    Liver Function Tests: Recent Labs  Lab 11/29/18 1936 12/01/18 1005 12/02/18 0550  AST 14*  --   --   ALT 5  --   --   ALKPHOS 93  --   --   BILITOT 0.4  --   --   PROT 7.4  --   --   ALBUMIN 3.3* 2.8* 2.9*   No results for input(s): LIPASE, AMYLASE in the last 168 hours. No results for input(s): AMMONIA in the last 168 hours. CBC: Recent Labs  Lab 11/28/18 1736 11/29/18 1936 12/01/18 1005  WBC 5.2 6.3 6.0  NEUTROABS 3.6 5.1  --   HGB 8.9* 8.5* 8.0*  HCT 28.6* 28.9* 25.8*  MCV 92.0 95.4  92.1  PLT 136* 144* 137*   Cardiac Enzymes: No results for input(s): CKTOTAL, CKMB, CKMBINDEX, TROPONINI in the last 168 hours. CBG: Recent Labs  Lab 12/01/18 0728 12/01/18 1104 12/01/18 1606 12/01/18 2152 12/02/18 0713  GLUCAP 209* 175* 185* 175* 169*    Iron Studies:  Recent Labs    11/30/18 1404  IRON 23*  TIBC 230*   Studies/Results: No results found. Marland Kitchen atorvastatin  20 mg Oral Daily  . calcium carbonate  2 tablet Oral With snacks  . calcium carbonate  2 tablet Oral With snacks  . calcium carbonate  4 tablet Oral TID WC  . Chlorhexidine Gluconate Cloth  6 each Topical Q0600  . Chlorhexidine Gluconate Cloth  6 each Topical Q0600  . cinacalcet  30  mg Oral Q breakfast  . insulin aspart  0-9 Units Subcutaneous TID WC  . insulin glargine  12 Units Subcutaneous BH-q7a  . loratadine  10 mg Oral Daily  . losartan  25 mg Oral QHS  . multivitamin  1 tablet Oral QHS  . pantoprazole  40 mg Oral Daily    BMET    Component Value Date/Time   NA 138 12/02/2018 0550   K 4.5 12/02/2018 0550   CL 97 (L) 12/02/2018 0550   CO2 28 12/02/2018 0550   GLUCOSE 190 (H) 12/02/2018 0550   BUN 52 (H) 12/02/2018 0550   CREATININE 7.09 (H) 12/02/2018 0550   CALCIUM 9.3 12/02/2018 0550   CALCIUM 9.4 04/10/2017 0425   GFRNONAA 6 (L) 12/02/2018 0550   GFRAA 7 (L) 12/02/2018 0550   CBC    Component Value Date/Time   WBC 6.0 12/01/2018 1005   RBC 2.80 (L) 12/01/2018 1005   HGB 8.0 (L) 12/01/2018 1005   HGB 11.5 03/16/2013   HCT 25.8 (L) 12/01/2018 1005   HCT 29.3 (L) 04/06/2017 0357   HCT 36 03/16/2013   PLT 137 (L) 12/01/2018 1005   MCV 92.1 12/01/2018 1005   MCV 87.8 03/16/2013   MCH 28.6 12/01/2018 1005   MCHC 31.0 12/01/2018 1005   RDW 16.9 (H) 12/01/2018 1005   LYMPHSABS 0.9 11/29/2018 1936   MONOABS 0.3 11/29/2018 1936   EOSABS 0.1 11/29/2018 1936   BASOSABS 0.0 11/29/2018 1936    Dialysis prescription:  [facility], MWF edw- 157 kg, 2K/2.5Ca, time 4.5 hrs,  bfr 400, dfr 800, heparin 1000 units   Assessment/ Plan:   1. Uremia- due to noncompliance with HD.  improved with serial HD 2. ESRD back on schedule for MWF.  Plan for HD again tomorrow for regular session. 3. Anemia: drop in Hgb.  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 Nutrition: renal diet 6. Hypertension: stable 7. Hyperkalemia- improved with serial HD 8. right ankle fracture- has appointment with Ortho on Monday.   9. Disposition- She is having issues with transferring and would benefit from SNF placement and rehab pre and post surgery.  Case management has been involved.  She is amenable to this plan and are awaiting placement.Donetta Potts, MD [facility] [phone]

## 2018-12-02 NOTE — Progress Notes (Signed)
Subjective: She did dialysis yesterday as noted.  She says she feels fairly well.  She is got a little bit of nausea.  She has had some constipation.  She is not confused.  She still has pain in her leg.  She has elected to go to a skilled care facility which I think is appropriate.  Objective: Vital signs in last 24 hours: Temp:  [98 F (36.7 C)-98.4 F (36.9 C)] 98.4 F (36.9 C) (05/06 2151) Pulse Rate:  [75-91] 79 (05/07 0556) Resp:  [16-20] 16 (05/07 0556) BP: (96-166)/(50-85) 140/70 (05/07 0556) SpO2:  [90 %-99 %] 99 % (05/07 0556) Weight:  [164 kg-167.2 kg] 164 kg (05/07 0500) Weight change: -1.1 kg Last BM Date: (pt stated before she was admitted)  Intake/Output from previous day: 05/06 0701 - 05/07 0700 In: 480 [P.O.:480] Out: 3655 [Emesis/NG output:800]  PHYSICAL EXAM General appearance: alert, cooperative and morbidly obese Resp: clear to auscultation bilaterally Cardio: regular rate and rhythm, S1, S2 normal, no murmur, click, rub or gallop GI: soft, non-tender; bowel sounds normal; no masses,  no organomegaly Extremities: She has wrapping on her left wrist and she has a splint on her right ankle She has had enucleation of her left eye Lab Results:  Results for orders placed or performed during the hospital encounter of 11/29/18 (from the past 48 hour(s))  Glucose, capillary     Status: Abnormal   Collection Time: 11/30/18 11:20 AM  Result Value Ref Range   Glucose-Capillary 162 (H) 70 - 99 mg/dL  Hepatitis B surface antigen     Status: None   Collection Time: 11/30/18  2:04 PM  Result Value Ref Range   Hepatitis B Surface Ag Negative Negative    Comment: (NOTE) Performed At: [facility] [place], [place] [id number] Rush Farmer MD UU:[id number]   Iron and TIBC     Status: Abnormal   Collection Time: 11/30/18  2:04 PM  Result Value Ref Range   Iron 23 (L) 28 - 170 ug/dL   TIBC 230 (L) 250 - 450 ug/dL   Saturation Ratios 10 (L) 10.4  - 31.8 %   UIBC 207 ug/dL    Comment: Performed at [facility], [address] [place], [place] [address]  Parathyroid hormone, intact (no Ca)     Status: None   Collection Time: 11/30/18  2:04 PM  Result Value Ref Range   PTH 27 15 - 65 pg/mL    Comment: (NOTE) Performed At: [facility] [place], [place] [id number] Rush Farmer MD [id number]   Glucose, capillary     Status: Abnormal   Collection Time: 11/30/18  4:14 PM  Result Value Ref Range   Glucose-Capillary 217 (H) 70 - 99 mg/dL  Glucose, capillary     Status: Abnormal   Collection Time: 11/30/18  9:23 PM  Result Value Ref Range   Glucose-Capillary 209 (H) 70 - 99 mg/dL   Comment 1 Notify RN    Comment 2 Document in Chart   Glucose, capillary     Status: Abnormal   Collection Time: 12/01/18 12:14 AM  Result Value Ref Range   Glucose-Capillary 189 (H) 70 - 99 mg/dL  Glucose, capillary     Status: Abnormal   Collection Time: 12/01/18  7:28 AM  Result Value Ref Range   Glucose-Capillary 209 (H) 70 - 99 mg/dL  Renal function panel     Status: Abnormal   Collection Time: 12/01/18 10:05 AM  Result Value Ref  Range   Sodium 140 135 - 145 mmol/L   Potassium 4.4 3.5 - 5.1 mmol/L   Chloride 97 (L) 98 - 111 mmol/L   CO2 26 22 - 32 mmol/L   Glucose, Bld 219 (H) 70 - 99 mg/dL   BUN 78 (H) 6 - 20 mg/dL   Creatinine, Ser 9.48 (H) 0.44 - 1.00 mg/dL   Calcium 9.5 8.9 - 10.3 mg/dL   Phosphorus 8.8 (H) 2.5 - 4.6 mg/dL   Albumin 2.8 (L) 3.5 - 5.0 g/dL   GFR calc non Af Amer 4 (L) >60 mL/min   GFR calc Af Amer 5 (L) >60 mL/min   Anion gap 17 (H) 5 - 15    Comment: Performed at [facility], [address] [place], [place] [address]  CBC     Status: Abnormal   Collection Time: 12/01/18 10:05 AM  Result Value Ref Range   WBC 6.0 4.0 - 10.5 K/uL   RBC 2.80 (L) 3.87 - 5.11 MIL/uL   Hemoglobin 8.0 (L) 12.0 - 15.0 g/dL   HCT 25.8 (L) 36.0 - 46.0 %   MCV 92.1 80.0 - 100.0 fL   MCH 28.6 26.0 - 34.0 pg    MCHC 31.0 30.0 - 36.0 g/dL   RDW 16.9 (H) 11.5 - 15.5 %   Platelets 137 (L) 150 - 400 K/uL   nRBC 0.0 0.0 - 0.2 %    Comment: Performed at [facility], [address] [place], [place] [address]  Glucose, capillary     Status: Abnormal   Collection Time: 12/01/18 11:04 AM  Result Value Ref Range   Glucose-Capillary 175 (H) 70 - 99 mg/dL  Glucose, capillary     Status: Abnormal   Collection Time: 12/01/18  4:06 PM  Result Value Ref Range   Glucose-Capillary 185 (H) 70 - 99 mg/dL  Glucose, capillary     Status: Abnormal   Collection Time: 12/01/18  9:52 PM  Result Value Ref Range   Glucose-Capillary 175 (H) 70 - 99 mg/dL  Renal function panel     Status: Abnormal   Collection Time: 12/02/18  5:50 AM  Result Value Ref Range   Sodium 138 135 - 145 mmol/L   Potassium 4.5 3.5 - 5.1 mmol/L   Chloride 97 (L) 98 - 111 mmol/L   CO2 28 22 - 32 mmol/L   Glucose, Bld 190 (H) 70 - 99 mg/dL   BUN 52 (H) 6 - 20 mg/dL   Creatinine, Ser 7.09 (H) 0.44 - 1.00 mg/dL   Calcium 9.3 8.9 - 10.3 mg/dL   Phosphorus 8.2 (H) 2.5 - 4.6 mg/dL   Albumin 2.9 (L) 3.5 - 5.0 g/dL   GFR calc non Af Amer 6 (L) >60 mL/min   GFR calc Af Amer 7 (L) >60 mL/min   Anion gap 13 5 - 15    Comment: Performed at [facility], [address], [place], [place] [address]  Glucose, capillary     Status: Abnormal   Collection Time: 12/02/18  7:13 AM  Result Value Ref Range   Glucose-Capillary 169 (H) 70 - 99 mg/dL    ABGS No results for input(s): PHART, PO2ART, TCO2, HCO3 in the last 72 hours.  Invalid input(s): PCO2 CULTURES Recent Results (from the past 240 hour(s))  MRSA PCR Screening     Status: None   Collection Time: 11/30/18  2:05 AM  Result Value Ref Range Status   MRSA by PCR NEGATIVE NEGATIVE Final    Comment:  The GeneXpert MRSA Assay (FDA approved for NASAL specimens only), is one component of a comprehensive MRSA colonization surveillance program. It is not intended to diagnose MRSA infection  nor to guide or monitor treatment for MRSA infections. Performed at [facility], [address], [place], [place] [address]    Studies/Results: No results found.  Medications:  Prior to Admission:  Medications Prior to Admission  Medication Sig Dispense Refill Last Dose  . albuterol (PROVENTIL) (2.5 MG/3ML) 0.083% nebulizer solution Take 2.5 mg by nebulization every 6 (six) hours as needed for wheezing or shortness of breath.    11/28/2018 at Unknown time  . ALPRAZolam (XANAX) 1 MG tablet Take 0.5 tablets (0.5 mg total) by mouth 2 (two) times daily as needed for anxiety (nerves). (Patient taking differently: Take 0.5 mg by mouth 4 (four) times daily. ) 30 tablet 0 11/28/2018 at Unknown time  . atorvastatin (LIPITOR) 20 MG tablet Take 20 mg by mouth daily.    11/28/2018 at Unknown time  . busPIRone (BUSPAR) 7.5 MG tablet Take 7.5 mg by mouth 2 (two) times daily.   11/28/2018 at Unknown time  . calcium carbonate (TUMS EX) 750 MG chewable tablet Chew 2-4 tablets by mouth See admin instructions. Patient takes 4 tablets with meals and 2 tablet with snacks three times a day   11/28/2018 at Unknown time  . cetirizine (ZYRTEC) 10 MG chewable tablet Chew 10 mg by mouth daily.   11/28/2018 at Unknown time  . cinacalcet (SENSIPAR) 30 MG tablet Take 30 mg by mouth daily.   11/28/2018 at Unknown time  . colchicine 0.6 MG tablet Take 1 tablet (0.6 mg total) by mouth every Tuesday, Thursday, and Saturday at 6 PM. Take 1 tablet every other day   11/27/2018 at Unknown time  . gabapentin (NEURONTIN) 300 MG capsule Take 300 mg by mouth at bedtime.   Past Week at Unknown time  . HYDROcodone-acetaminophen (NORCO) 10-325 MG tablet Take 1 tablet by mouth every 6 (six) hours as needed.   Past Week at Unknown time  . Insulin Glargine (LANTUS SOLOSTAR) 100 UNIT/ML Solostar Pen Inject 30 Units into the skin every morning. And pen needles 1/day 15 mL 11 11/28/2018 at Unknown time  . lidocaine-prilocaine (EMLA) cream Apply 1 application  topically as needed (topical anesthesia for hemodialysis if Gebauers and Lidocaine injection are ineffective.). 30 g 0 Past Week at Unknown time  . losartan (COZAAR) 25 MG tablet Take 25 mg by mouth daily.   11/28/2018 at Unknown time  . omeprazole (PRILOSEC) 40 MG capsule Take 40 mg by mouth daily.   11/28/2018 at Unknown time  . oxyCODONE-acetaminophen (PERCOCET/ROXICET) 5-325 MG tablet Take 1 tablet by mouth every 4 (four) hours as needed for severe pain. 15 tablet 0 Past Week at Unknown time  . PARoxetine (PAXIL) 20 MG tablet Take 1 daily. This is to prevent panic attacks (Patient taking differently: Take 20 mg by mouth daily. This is to prevent panic attacks) 30 tablet 12 11/28/2018 at Unknown time  . torsemide (DEMADEX) 100 MG tablet Take 100 mg by mouth 2 (two) times daily.    11/28/2018 at Unknown time  . oxyCODONE-acetaminophen (PERCOCET) 5-325 MG tablet Take 1 tablet by mouth every 4 (four) hours as needed for severe pain. (Patient not taking: Reported on 11/29/2018) 20 tablet 0 Not Taking at Unknown time   Scheduled: . atorvastatin  20 mg Oral Daily  . calcium carbonate  2 tablet Oral With snacks  . calcium carbonate  2  tablet Oral With snacks  . calcium carbonate  4 tablet Oral TID WC  . Chlorhexidine Gluconate Cloth  6 each Topical Q0600  . Chlorhexidine Gluconate Cloth  6 each Topical Q0600  . cinacalcet  30 mg Oral Q breakfast  . insulin aspart  0-9 Units Subcutaneous TID WC  . insulin glargine  12 Units Subcutaneous BH-q7a  . loratadine  10 mg Oral Daily  . losartan  25 mg Oral QHS  . multivitamin  1 tablet Oral QHS  . pantoprazole  40 mg Oral Daily   Continuous: . sodium chloride    . sodium chloride    . [START ON 12/03/2018] ferumoxytol     BJS:EGBTDV chloride, sodium chloride, acetaminophen **OR** acetaminophen, albuterol, alteplase, heparin, HYDROcodone-acetaminophen, morphine, ondansetron **OR** ondansetron (ZOFRAN) IV, pentafluoroprop-tetrafluoroeth, polyethylene  glycol  Assesment: She was admitted because of metabolic encephalopathy likely related to uremia.  She had missed dialysis for several sessions because of a variety of problems.  She had surgery for left carpal tunnel syndrome.  She then was going to get up to get ready to go to dialysis and fell and had a trimalleolar fracture of her right ankle.  She has dialyzed twice here and she is doing better.  She is deconditioned has difficulty with transfers and has agreed to skilled care facility placement which I think is appropriate  She has end-stage renal disease on dialysis and normally dialyzes Monday Wednesday and Friday so she should be back on schedule now  She has diabetes treated with insulin stable  She complains of nausea and has received treatment and of constipation which I think is mostly because she is not been active since she fractured her ankle  She has hypertension stable  She has morbid obesity which is unchanged  He has sleep apnea and uses CPAP at night   Active Problems:   Morbid obesity ([place])   RESTLESS LEG SYNDROME   Essential hypertension   Sleep apnea   Type 2 diabetes mellitus treated with insulin (HCC)   ESRD (end stage renal disease) on dialysis ([place])   History of enucleation of left eyeball   Metabolic encephalopathy    Plan: Continue treatments.  Potential transfer to skilled care facility    LOS: 2 days   Martha [name] 12/02/2018, 8:18 AM

## 2018-12-02 NOTE — NC FL2 (Signed)
[place] LEVEL OF CARE SCREENING TOOL     IDENTIFICATION  Patient Name: Martha [name] Birthdate: [birth year] Sex: female Admission Date (Current Location): 11/29/2018  [place] and [place] Number:  [place] [phone] [place] and Address:  [place] [address] [place]      Provider Number: [phone]  Attending Physician Name and Address:  Sinda Du, MD  Relative Name and Phone Number:  Junie Bame [phone]    Current Level of Care: Hospital Recommended Level of Care: [place] Prior Approval Number:    Date Approved/Denied:   PASRR Number: [id number]  Discharge Plan: SNF    Current Diagnoses: Patient Active Problem List   Diagnosis Date Noted  . Metabolic encephalopathy 30/16/0109  . SIRS (systemic inflammatory response syndrome) ([place]) 06/23/2018  . UTI (urinary tract infection) 02/26/2018  . Volume overload 02/25/2018  . 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 Dialysis patient ([place]) 12/13/2017  . ESRD (end stage renal disease) on dialysis ([place])   . Chronic diarrhea 10/08/2017  . Renal failure (ARF), acute on chronic (HCC) 04/22/2017  . Anasarca associated with disorder of kidney 04/20/2017  . Protein-calorie malnutrition, severe 11/29/2016  . HTN (hypertension), malignant 11/20/2016  . Bipolar 1 disorder ([place]) 11/20/2016  . Noncompliance with medication regimen 11/20/2016  . Panic attack 11/20/2016  . Uncontrolled type 2 diabetes mellitus with hyperglycemia, without long-term current use of insulin ([place]) 10/27/2016  . Sensory disturbance 10/27/2016  . Left hemiparesis ([place]) 10/27/2016  . Cholelithiasis 04/26/2014  . Hyponatremia 04/26/2014  . Gallstones 04/06/2014  . Diabetic ulcer of left great toe ([place]) 10/09/2013  . Type 2 diabetes mellitus treated with insulin ([place]) 05/30/2013  . Hyperglycemia 05/30/2013  . Internal hemorrhoids with other complication 32/35/5732  .  Umbilical hernia without mention of obstruction or gangrene 04/15/2013  . Anemia 04/15/2013  . DM 05/13/2010  . Hyperlipidemia 05/13/2010  . Morbid obesity ([place]) 05/13/2010  . Depression with anxiety 05/13/2010  . RESTLESS LEG SYNDROME 05/13/2010  . Essential hypertension 05/13/2010  . GERD 05/13/2010  . RECTAL BLEEDING 05/13/2010  . Sleep apnea 05/13/2010  . Dysphagia, oropharyngeal phase 05/13/2010    Orientation RESPIRATION BLADDER Height & Weight     Self, Time, Situation, Place  O2(2lpm and CPAP HS) Continent(oliguric) Weight: (!) 164 kg Height:  5\' 8"  (172.7 cm)  BEHAVIORAL SYMPTOMS/MOOD NEUROLOGICAL BOWEL NUTRITION STATUS  (n/a) (N/A) Continent Diet(renal carb modified; fluid resctriction of 1213ml)  AMBULATORY STATUS COMMUNICATION OF NEEDS Skin   Total Care Verbally Surgical wounds(incision left wrist; old surgical wound on umbilical area thats open to air but partially healed)                       Personal Care Assistance Level of Assistance  Bathing, Feeding, Dressing Bathing Assistance: Maximum assistance Feeding assistance: Independent Dressing Assistance: Maximum assistance     Functional Limitations Info  Sight, Hearing, Speech Sight Info: Impaired Hearing Info: Adequate Speech Info: Adequate    SPECIAL CARE FACTORS FREQUENCY  PT (By licensed PT)     PT Frequency: 5 days/week or as able              Contractures Contractures Info: Not present    Additional Factors Info  Code Status, Allergies, Psychotropic, Insulin Sliding Scale Code Status Info: full Allergies Info: codeine; tape Psychotropic Info: xanax, buspar, paxil Insulin Sliding Scale Info: see DC summary       Current Medications (  12/02/2018):  This is the current hospital active medication list Current Facility-Administered Medications  Medication Dose Route Frequency Provider Last Rate Last Dose  . 0.9 %  sodium chloride infusion  100 mL Intravenous PRN Deterding, Jeneen Rinks, MD       . 0.9 %  sodium chloride infusion  100 mL Intravenous PRN Deterding, Jeneen Rinks, MD      . acetaminophen (TYLENOL) tablet 650 mg  650 mg Oral Q6H PRN Emokpae, Ejiroghene E, MD       Or  . acetaminophen (TYLENOL) suppository 650 mg  650 mg Rectal Q6H PRN Emokpae, Ejiroghene E, MD      . albuterol (PROVENTIL) (2.5 MG/3ML) 0.083% nebulizer solution 2.5 mg  2.5 mg Nebulization Q6H PRN Emokpae, Ejiroghene E, MD   2.5 mg at 12/01/18 1645  . alteplase (CATHFLO ACTIVASE) injection 2 mg  2 mg Intracatheter Once PRN Mauricia Area, MD      . atorvastatin (LIPITOR) tablet 20 mg  20 mg Oral Daily Emokpae, Ejiroghene E, MD   20 mg at 12/02/18 0805  . calcium carbonate (TUMS - dosed in mg elemental calcium) chewable tablet 400 mg of elemental calcium  2 tablet Oral With snacks Emokpae, Ejiroghene E, MD      . calcium carbonate (TUMS - dosed in mg elemental calcium) chewable tablet 400 mg of elemental calcium  2 tablet Oral With snacks Sinda Du, MD      . calcium carbonate (TUMS - dosed in mg elemental calcium) chewable tablet 800 mg of elemental calcium  4 tablet Oral TID WC Sinda Du, MD   800 mg of elemental calcium at 12/02/18 1132  . Chlorhexidine Gluconate Cloth 2 % PADS 6 each  6 each Topical [id number] Mauricia Area, MD   6 each at 12/02/18 0532  . Chlorhexidine Gluconate Cloth 2 % PADS 6 each  6 each Topical [id number] Mauricia Area, MD   6 each at 12/02/18 0532  . cinacalcet (SENSIPAR) tablet 30 mg  30 mg Oral Q breakfast Emokpae, Ejiroghene E, MD   30 mg at 12/02/18 0805  . [START ON 12/03/2018] doxercalciferol (HECTOROL) injection 1 mcg  1 mcg Intravenous Q M,W,F-HD Donato Heinz, MD      . Derrill Memo ON 12/03/2018] epoetin alfa (EPOGEN) injection 10,000 Units  10,000 Units Intravenous Once per day on Mon Wed Fri Coladonato, Joseph, MD      . Derrill Memo ON 12/03/2018] ferumoxytol [facility]) 510 mg in sodium chloride 0.9 % 100 mL IVPB  510 mg Intravenous Once Donato Heinz, MD      . heparin  injection 3,000 Units  3,000 Units Dialysis PRN Deterding, Jeneen Rinks, MD      . HYDROcodone-acetaminophen (NORCO/VICODIN) 5-325 MG per tablet 1 tablet  1 tablet Oral Q6H PRN Sinda Du, MD   1 tablet at 12/01/18 [phone]  . insulin aspart (novoLOG) injection 0-9 Units  0-9 Units Subcutaneous TID WC Emokpae, Ejiroghene E, MD   3 Units at 12/02/18 1132  . insulin glargine (LANTUS) injection 12 Units  12 Units Subcutaneous Kerrin Champagne, MD   12 Units at 12/02/18 1006  . loratadine (CLARITIN) tablet 10 mg  10 mg Oral Daily Emokpae, Ejiroghene E, MD   10 mg at 12/02/18 0805  . losartan (COZAAR) tablet 25 mg  25 mg Oral QHS Deterding, Jeneen Rinks, MD      . morphine 4 MG/ML injection 4 mg  4 mg Intravenous Q4H PRN Sinda Du, MD   4 mg at 12/02/18 1131  . multivitamin (RENA-VIT) tablet  1 tablet  1 tablet Oral Nile Riggs, MD   1 tablet at 12/01/18 2201  . ondansetron (ZOFRAN) tablet 4 mg  4 mg Oral Q6H PRN Emokpae, Ejiroghene E, MD   4 mg at 12/01/18 1933   Or  . ondansetron (ZOFRAN) injection 4 mg  4 mg Intravenous Q6H PRN Emokpae, Ejiroghene E, MD   4 mg at 12/02/18 0805  . pantoprazole (PROTONIX) EC tablet 40 mg  40 mg Oral Daily Emokpae, Ejiroghene E, MD   40 mg at 12/02/18 0805  . pentafluoroprop-tetrafluoroeth (GEBAUERS) aerosol 1 application  1 application Topical PRN Deterding, Jeneen Rinks, MD      . polyethylene glycol (MIRALAX / GLYCOLAX) packet 17 g  17 g Oral Daily PRN Emokpae, Ejiroghene E, MD   17 g at 12/02/18 1006     Discharge Medications: Please see discharge summary for a list of discharge medications.  Relevant Imaging Results:  Relevant Lab Results:   Additional Information SSN [id number] [id number] ; ESRD on HD MWF at [facility] in [place]; Ht: 5'8" and Wt: 168KG  Roda Shutters, Margretta Sidle, RN

## 2018-12-03 LAB — RENAL FUNCTION PANEL
Albumin: 3.2 g/dL — ABNORMAL LOW (ref 3.5–5.0)
Anion gap: 16 — ABNORMAL HIGH (ref 5–15)
BUN: 71 mg/dL — ABNORMAL HIGH (ref 6–20)
CO2: 28 mmol/L (ref 22–32)
Calcium: 9.2 mg/dL (ref 8.9–10.3)
Chloride: 93 mmol/L — ABNORMAL LOW (ref 98–111)
Creatinine, Ser: 9.21 mg/dL — ABNORMAL HIGH (ref 0.44–1.00)
GFR calc Af Amer: 5 mL/min — ABNORMAL LOW (ref >60)
GFR calc non Af Amer: 4 mL/min — ABNORMAL LOW (ref >60)
Glucose, Bld: 204 mg/dL — ABNORMAL HIGH (ref 70–99)
Phosphorus: 9.5 mg/dL — ABNORMAL HIGH (ref 2.5–4.6)
Potassium: 4.6 mmol/L (ref 3.5–5.1)
Sodium: 137 mmol/L (ref 135–145)

## 2018-12-03 LAB — CBC
HCT: 27.1 % — ABNORMAL LOW (ref 36.0–46.0)
Hemoglobin: 8.3 g/dL — ABNORMAL LOW (ref 12.0–15.0)
MCH: 28.6 pg (ref 26.0–34.0)
MCHC: 30.6 g/dL (ref 30.0–36.0)
MCV: 93.4 fL (ref 80.0–100.0)
Platelets: 168 10*3/uL (ref 150–400)
RBC: 2.9 MIL/uL — ABNORMAL LOW (ref 3.87–5.11)
RDW: 16.8 % — ABNORMAL HIGH (ref 11.5–15.5)
WBC: 7.2 10*3/uL (ref 4.0–10.5)
nRBC: 0 % (ref 0.0–0.2)

## 2018-12-03 LAB — NOVEL CORONAVIRUS, NAA (HOSP ORDER, SEND-OUT TO REF LAB; TAT 18-24 HRS): SARS-CoV-2, NAA: NOT DETECTED

## 2018-12-03 LAB — GLUCOSE, CAPILLARY
Glucose-Capillary: 134 mg/dL — ABNORMAL HIGH (ref 70–99)
Glucose-Capillary: 188 mg/dL — ABNORMAL HIGH (ref 70–99)
Glucose-Capillary: 136 mg/dL — ABNORMAL HIGH (ref 70–99)
Glucose-Capillary: 134 mg/dL — ABNORMAL HIGH (ref 70–99)

## 2018-12-03 MED ORDER — HYDROCODONE-ACETAMINOPHEN 5-325 MG PO TABS: 1.0000 | ORAL_TABLET | Freq: Four times a day (QID) | ORAL | 0 refills | Status: DC | PRN

## 2018-12-03 MED ORDER — DIPHENHYDRAMINE HCL 25 MG PO CAPS
25.0000 mg | ORAL_CAPSULE | Freq: Four times a day (QID) | ORAL | Status: DC | PRN
  Administered 2018-12-03 – 2018-12-04 (×2): 25 mg via ORAL
  Filled 2018-12-03 (×2): qty 1

## 2018-12-03 MED ORDER — FLEET ENEMA 7-19 GM/118ML RE ENEM: 1.0000 | ENEMA | Freq: Once | RECTAL | Status: DC

## 2018-12-03 MED ORDER — DOXERCALCIFEROL 4 MCG/2ML IV SOLN: 1.0000 ug | INTRAVENOUS | Status: DC

## 2018-12-03 MED ORDER — ALBUMIN HUMAN 25 % IV SOLN
50.0000 g | Freq: Once | INTRAVENOUS | Status: AC
  Administered 2018-12-03: 50 g via INTRAVENOUS
  Filled 2018-12-03: qty 200

## 2018-12-03 MED ORDER — HEPARIN SODIUM (PORCINE) 1000 UNIT/ML DIALYSIS
20.0000 [IU]/kg | INTRAMUSCULAR | Status: DC | PRN
  Administered 2018-12-03 – 2018-12-06 (×2): 3300 [IU] via INTRAVENOUS_CENTRAL
  Filled 2018-12-03 (×3): qty 4

## 2018-12-03 MED ORDER — BISACODYL 10 MG RE SUPP
10.0000 mg | Freq: Every day | RECTAL | Status: DC | PRN
  Administered 2018-12-03: 10 mg via RECTAL
  Filled 2018-12-03: qty 1

## 2018-12-03 MED ORDER — EPOETIN ALFA 10000 UNIT/ML IJ SOLN: 10000.0000 [IU] | INTRAMUSCULAR | Status: DC

## 2018-12-03 MED ORDER — DIPHENHYDRAMINE HCL 25 MG PO CAPS: 25.0000 mg | ORAL_CAPSULE | Freq: Four times a day (QID) | ORAL | 0 refills | Status: DC | PRN

## 2018-12-03 MED ORDER — HYDROMORPHONE HCL 1 MG/ML IJ SOLN
0.5000 mg | INTRAMUSCULAR | Status: DC | PRN
  Administered 2018-12-03 – 2018-12-06 (×6): 0.5 mg via INTRAVENOUS
  Filled 2018-12-03 (×6): qty 0.5

## 2018-12-03 MED ORDER — BISACODYL 10 MG RE SUPP: 10.0000 mg | Freq: Every day | RECTAL | 0 refills | Status: DC | PRN

## 2018-12-03 NOTE — TOC Progression Note (Signed)
Transition of Care [facility]) - Progression Note    Patient Details  Name: Martha [name] MRN: [id number] Date of Birth: [birth year]  Transition of Care [facility]) CM/SW Contact  Roda Shutters Margretta Sidle, RN Phone Number: 12/03/2018, 3:44 PM  Clinical Narrative:   CM updated from [facility] rep, she has been to visit pt and has submitted information to their administration for decision on bed offer.     Expected Discharge Plan: Skilled Nursing Facility Barriers to Discharge: No SNF bed

## 2018-12-03 NOTE — Progress Notes (Signed)
Patient ID: Martha [name], female   DOB: [birth year], 53 y.o.   MRN: [id number] S:c/o nausea this morning O:BP (!) 114/55 (BP Location: Right Arm)   Pulse 81   Temp 97.9 F (36.6 C) (Oral)   Resp 17   Ht 5\' 8"  (1.727 m)   Wt (!) 164.2 kg   LMP 08/03/2008 (Exact Date)   SpO2 90%   BMI 55.04 kg/m   Intake/Output Summary (Last 24 hours) at 12/03/2018 1019 Last data filed at 12/02/2018 1600 Gross per 24 hour  Intake 240 ml  Output 100 ml  Net 140 ml   Intake/Output: I/O last 3 completed shifts: In: 240 [P.O.:240] Out: 900 [Urine:100; Emesis/NG output:800]  Intake/Output this shift:  No intake/output data recorded. Weight change: -3 kg Gen: obese WF in NAD CVS: no rub Resp:cta Abd: benign Ext: LUE AVF +T/B  Recent Labs  Lab 11/28/18 1736 11/29/18 1009 11/29/18 1936 11/30/18 0539 12/01/18 1005 12/02/18 0550  NA 141 141 140 144 140 138  K 5.7* 5.8* 6.3* 5.0 4.4 4.5  CL 105 105 104 107 97* 97*  CO2 20* 20* 20* 21* 26 28  GLUCOSE 314* 225* 318* 157* 219* 190*  BUN 108* 112* 114* 116* 78* 52*  CREATININE 10.56* 11.16* 11.26* 11.79* 9.48* 7.09*  ALBUMIN  --   --  3.3*  --  2.8* 2.9*  CALCIUM 9.2 9.3 9.1 9.4 9.5 9.3  PHOS  --   --   --   --  8.8* 8.2*  AST  --   --  14*  --   --   --   ALT  --   --  5  --   --   --    Liver Function Tests: Recent Labs  Lab 11/29/18 1936 12/01/18 1005 12/02/18 0550  AST 14*  --   --   ALT 5  --   --   ALKPHOS 93  --   --   BILITOT 0.4  --   --   PROT 7.4  --   --   ALBUMIN 3.3* 2.8* 2.9*   No results for input(s): LIPASE, AMYLASE in the last 168 hours. No results for input(s): AMMONIA in the last 168 hours. CBC: Recent Labs  Lab 11/28/18 1736 11/29/18 1936 12/01/18 1005  WBC 5.2 6.3 6.0  NEUTROABS 3.6 5.1  --   HGB 8.9* 8.5* 8.0*  HCT 28.6* 28.9* 25.8*  MCV 92.0 95.4 92.1  PLT 136* 144* 137*   Cardiac Enzymes: No results for input(s): CKTOTAL, CKMB, CKMBINDEX, TROPONINI in the last 168 hours. CBG: Recent Labs  Lab  12/02/18 0713 12/02/18 1109 12/02/18 1615 12/02/18 2136 12/03/18 0728  GLUCAP 169* 202* 141* 155* 134*    Iron Studies:  Recent Labs    11/30/18 1404  IRON 23*  TIBC 230*   Studies/Results: No results found. Marland Kitchen atorvastatin  20 mg Oral Daily  . calcium carbonate  2 tablet Oral With snacks  . calcium carbonate  2 tablet Oral With snacks  . calcium carbonate  4 tablet Oral TID WC  . Chlorhexidine Gluconate Cloth  6 each Topical Q0600  . Chlorhexidine Gluconate Cloth  6 each Topical Q0600  . cinacalcet  30 mg Oral Q breakfast  . doxercalciferol  1 mcg Intravenous Q M,W,F-HD  . epoetin alfa  10,000 Units Intravenous Once per day on Mon Wed Fri  . insulin aspart  0-9 Units Subcutaneous TID WC  . insulin glargine  12 Units Subcutaneous BH-q7a  .  loratadine  10 mg Oral Daily  . losartan  25 mg Oral QHS  . multivitamin  1 tablet Oral QHS  . pantoprazole  40 mg Oral Daily    BMET    Component Value Date/Time   NA 138 12/02/2018 0550   K 4.5 12/02/2018 0550   CL 97 (L) 12/02/2018 0550   CO2 28 12/02/2018 0550   GLUCOSE 190 (H) 12/02/2018 0550   BUN 52 (H) 12/02/2018 0550   CREATININE 7.09 (H) 12/02/2018 0550   CALCIUM 9.3 12/02/2018 0550   CALCIUM 9.4 04/10/2017 0425   GFRNONAA 6 (L) 12/02/2018 0550   GFRAA 7 (L) 12/02/2018 0550   CBC    Component Value Date/Time   WBC 6.0 12/01/2018 1005   RBC 2.80 (L) 12/01/2018 1005   HGB 8.0 (L) 12/01/2018 1005   HGB 11.5 03/16/2013   HCT 25.8 (L) 12/01/2018 1005   HCT 29.3 (L) 04/06/2017 0357   HCT 36 03/16/2013   PLT 137 (L) 12/01/2018 1005   MCV 92.1 12/01/2018 1005   MCV 87.8 03/16/2013   MCH 28.6 12/01/2018 1005   MCHC 31.0 12/01/2018 1005   RDW 16.9 (H) 12/01/2018 1005   LYMPHSABS 0.9 11/29/2018 1936   MONOABS 0.3 11/29/2018 1936   EOSABS 0.1 11/29/2018 1936   BASOSABS 0.0 11/29/2018 1936    Dialysis prescription:  [facility], MWF edw- 157 kg, 2K/2.5Ca, time 4.5 hrs, bfr 400, dfr 800, heparin 1000  units   Assessment/ Plan:  1. Uremia- due to noncompliance with HD. improved with serial HD and now at baseline. 2. ESRDback on schedule for MWF.  1. Plan for HD again today for regular session. 2. Will also order for HD on Monday if she does not find a SNF over the weekend. 3. Anemia:drop in Hgb. Low iron stores will dose with IV Iron 1. Will resume procrit 10,000 units IVP qHD 4. CKD-MBD:continue with binders and sensipar 5. Nutrition:renal diet 6. Hypertension:stable 7. Hyperkalemia- improved with serial HD 8. right ankle fracture- has appointment with Ortho on Monday.  9. Disposition- She is having issues with transferring and would benefit from SNF placement and rehab pre and post surgery. Case management has been involved. She is amenable to this plan and awaiting placement.Donetta Potts, MD [facility] [phone]

## 2018-12-03 NOTE — Discharge Summary (Signed)
Physician Discharge Summary  Patient ID: Martha [name] MRN: [id number] DOB/AGE: 51-09-1965 51 y.o. Primary Care Physician:Kylan Liberati, Percell Miller, MD Admit date: 11/29/2018 Discharge date: 12/03/2018    Discharge Diagnoses:   Active Problems:   Morbid obesity (Martha [name])   RESTLESS LEG SYNDROME   Essential hypertension   Sleep apnea   Type 2 diabetes mellitus treated with insulin (Martha [name])   ESRD (end stage renal disease) on dialysis Martha [name])   History of enucleation of left eyeball   Metabolic encephalopathy Anxiety Depression Allergic reaction to morphine Constipation Uremia  Allergies as of 12/03/2018      Reactions   Codeine Nausea And Vomiting   Tape Rash, Other (See Comments)   Pull skin off.  Paper tape is ok      Medication List    STOP taking these medications   HYDROcodone-acetaminophen 10-325 MG tablet Commonly known as:  NORCO Replaced by:  HYDROcodone-acetaminophen 5-325 MG tablet     TAKE these medications   acetaminophen 325 MG tablet Commonly known as:  TYLENOL Take 2 tablets (650 mg total) by mouth every 6 (six) hours as needed for mild pain (or Fever >/= 101).   albuterol (2.5 MG/3ML) 0.083% nebulizer solution Commonly known as:  PROVENTIL Take 2.5 mg by nebulization every 6 (six) hours as needed for wheezing or shortness of breath.   ALPRAZolam 1 MG tablet Commonly known as:  XANAX Take 0.5 tablets (0.5 mg total) by mouth 4 (four) times daily.   atorvastatin 20 MG tablet Commonly known as:  LIPITOR Take 20 mg by mouth daily.   bisacodyl 10 MG suppository Commonly known as:  DULCOLAX Place 1 suppository (10 mg total) rectally daily as needed for moderate constipation.   busPIRone 7.5 MG tablet Commonly known as:  BUSPAR Take 7.5 mg by mouth 2 (two) times daily.   calcium carbonate 750 MG chewable tablet Commonly known as:  TUMS EX Chew 2-4 tablets by mouth See admin instructions. Patient takes 4 tablets with meals and 2 tablet with snacks three times a day    cetirizine 10 MG chewable tablet Commonly known as:  ZYRTEC Chew 10 mg by mouth daily.   cinacalcet 30 MG tablet Commonly known as:  SENSIPAR Take 30 mg by mouth daily.   colchicine 0.6 MG tablet Take 1 tablet (0.6 mg total) by mouth every Tuesday, Thursday, and Saturday at 6 PM. Take 1 tablet every other day   diphenhydrAMINE 25 mg capsule Commonly known as:  BENADRYL Take 1 capsule (25 mg total) by mouth every 6 (six) hours as needed for itching.   doxercalciferol 4 MCG/2ML injection Commonly known as:  HECTOROL Inject 0.5 mLs (1 mcg total) into the vein every Monday, Wednesday, and Friday with hemodialysis.   epoetin alfa 10000 UNIT/ML injection Commonly known as:  EPOGEN Inject 1 mL (10,000 Units total) into the vein 3 (three) times a week.   gabapentin 300 MG capsule Commonly known as:  NEURONTIN Take 300 mg by mouth at bedtime.   HYDROcodone-acetaminophen 5-325 MG tablet Commonly known as:  NORCO/VICODIN Take 1 tablet by mouth every 6 (six) hours as needed for moderate pain. Replaces:  HYDROcodone-acetaminophen 10-325 MG tablet   insulin aspart 100 UNIT/ML injection Commonly known as:  novoLOG Inject 0-9 Units into the skin 3 (three) times daily with meals.   Insulin Glargine 100 UNIT/ML Solostar Pen Commonly known as:  Lantus SoloStar Inject 30 Units into the skin every morning. And pen needles 1/day   lidocaine-prilocaine cream Commonly known as:  EMLA Apply 1 application topically as needed (topical anesthesia for hemodialysis if Gebauers and Lidocaine injection are ineffective.).   losartan 25 MG tablet Commonly known as:  COZAAR Take 25 mg by mouth daily.   omeprazole 40 MG capsule Commonly known as:  PRILOSEC Take 40 mg by mouth daily.   ondansetron 4 MG tablet Commonly known as:  ZOFRAN Take 1 tablet (4 mg total) by mouth every 6 (six) hours as needed for nausea.   oxyCODONE-acetaminophen 5-325 MG tablet Commonly known as:   PERCOCET/ROXICET Take 1 tablet by mouth every 4 (four) hours as needed for severe pain. What changed:  Another medication with the same name was removed. Continue taking this medication, and follow the directions you see here.   PARoxetine 20 MG tablet Commonly known as:  Paxil Take 1 daily. This is to prevent panic attacks What changed:    how much to take  how to take this  when to take this  additional instructions   polyethylene glycol 17 g packet Commonly known as:  MIRALAX / GLYCOLAX Take 17 g by mouth daily as needed for mild constipation.   torsemide 100 MG tablet Commonly known as:  DEMADEX Take 100 mg by mouth 2 (two) times daily.            Durable Medical Equipment  (From admission, onward)         Start     Ordered   11/30/18 1122  For home use only DME Other see comment  Once    Comments:  Hoyer lift - (bariatric if qualifies); ht: 5'8", wt: 168kg   11/30/18 1123          Discharged Condition: Improved    Consults: Nephrology  Significant Diagnostic Studies: Dg Ankle Complete Right  Result Date: 11/29/2018 CLINICAL DATA:  Fall this morning with swelling and bruising to the right ankle EXAM: RIGHT ANKLE - COMPLETE 3+ VIEW COMPARISON:  None FINDINGS: Oblique fracture through the distal fibular shaft. There is a transverse fracture through the medial malleolus. Widening of the medial clear space. Posterior malleolus fracture is present and retracted proximally. Located hindfoot.  Heel spurs. IMPRESSION: Trimalleolar fracture with widening of the medial clear space. Electronically Signed   By: Monte Fantasia M.D.   On: 11/29/2018 07:28   Ct Head Wo Contrast  Result Date: 11/29/2018 CLINICAL DATA:  Blunt maxillofacial trauma EXAM: CT HEAD WITHOUT CONTRAST CT CERVICAL SPINE WITHOUT CONTRAST TECHNIQUE: Multidetector CT imaging of the head and cervical spine was performed following the standard protocol without intravenous contrast. Multiplanar CT image  reconstructions of the cervical spine were also generated. COMPARISON:  Head CT 11/28/2018.  Cervical spine CT 06/21/2015 FINDINGS: CT HEAD FINDINGS Brain: No evidence of acute infarction, hemorrhage, hydrocephalus, extra-axial collection or mass lesion/mass effect. Vascular: No hyperdense vessel or unexpected calcification. Skull: Negative Sinuses/Orbits: Left nucleation with prosthesis. Right-sided oil tamponade. CT CERVICAL SPINE FINDINGS Alignment: Normal Skull base and vertebrae: Negative for acute fracture Soft tissues and spinal canal: No prevertebral fluid or swelling. No visible canal hematoma. 22 mm left thyroid nodule without worrisome growth since cervical spine CT in 2016. Disc levels:  No notable degenerative changes Upper chest: Negative IMPRESSION: No evidence of acute intracranial or cervical spine injury. Electronically Signed   By: Monte Fantasia M.D.   On: 11/29/2018 07:41   Ct Head Wo Contrast  Result Date: 11/28/2018 CLINICAL DATA:  Jerking and tremors that began today, end-stage renal disease on dialysis, CHF, type II diabetes mellitus,  hypertension, COPD EXAM: CT HEAD WITHOUT CONTRAST TECHNIQUE: Contiguous axial images were obtained from the base of the skull through the vertex without intravenous contrast. Sagittal and coronal MPR images reconstructed from axial data set. COMPARISON:  09/26/2018 FINDINGS: Brain: Normal ventricular morphology. No midline shift or mass effect. Normal appearance of brain parenchyma. No intracranial hemorrhage, mass lesion or evidence of acute infarction. No extra-axial fluid collections. Vascular: No abnormalities Skull: Intact Sinuses/Orbits: Clear. 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 Chronic increased attenuation of the RIGHT optic globe again noted. IMPRESSION: No acute intracranial mallet. Electronically Signed   By: Lavonia Dana M.D.   On: 11/28/2018 19:58   Ct Cervical Spine Wo Contrast  Result  Date: 11/29/2018 CLINICAL DATA:  Blunt maxillofacial trauma EXAM: CT HEAD WITHOUT CONTRAST CT CERVICAL SPINE WITHOUT CONTRAST TECHNIQUE: Multidetector CT imaging of the head and cervical spine was performed following the standard protocol without intravenous contrast. Multiplanar CT image reconstructions of the cervical spine were also generated. COMPARISON:  Head CT 11/28/2018.  Cervical spine CT 06/21/2015 FINDINGS: CT HEAD FINDINGS Brain: No evidence of acute infarction, hemorrhage, hydrocephalus, extra-axial collection or mass lesion/mass effect. Vascular: No hyperdense vessel or unexpected calcification. Skull: Negative Sinuses/Orbits: Left nucleation with prosthesis. Right-sided oil tamponade. CT CERVICAL SPINE FINDINGS Alignment: Normal Skull base and vertebrae: Negative for acute fracture Soft tissues and spinal canal: No prevertebral fluid or swelling. No visible canal hematoma. 22 mm left thyroid nodule without worrisome growth since cervical spine CT in 2016. Disc levels:  No notable degenerative changes Upper chest: Negative IMPRESSION: No evidence of acute intracranial or cervical spine injury. Electronically Signed   By: Monte Fantasia M.D.   On: 11/29/2018 07:41   Dg Chest Port 1 View  Result Date: 11/29/2018 CLINICAL DATA:  Shortness of breath, weakness, history hypertension, CHF, COPD, former smoker EXAM: PORTABLE CHEST 1 VIEW COMPARISON:  Portable exam 1939 hours compared to 09/28/2018 FINDINGS: RIGHT jugular line with tip projecting over SVC. Enlargement of cardiac silhouette with pulmonary vascular congestion. Mediastinal contours normal. Lungs clear. No pleural effusion or pneumothorax. Bones unremarkable. IMPRESSION: Enlargement of cardiac silhouette with pulmonary vascular congestion. No acute infiltrate. Electronically Signed   By: Lavonia Dana M.D.   On: 11/29/2018 19:46    Lab Results: Basic Metabolic Panel: Recent Labs    12/01/18 1005 12/02/18 0550  NA 140 138  K 4.4 4.5  CL  97* 97*  CO2 26 28  GLUCOSE 219* 190*  BUN 78* 52*  CREATININE 9.48* 7.09*  CALCIUM 9.5 9.3  PHOS 8.8* 8.2*   Liver Function Tests: Recent Labs    12/01/18 1005 12/02/18 0550  ALBUMIN 2.8* 2.9*     CBC: Recent Labs    12/01/18 1005  WBC 6.0  HGB 8.0*  HCT 25.8*  MCV 92.1  PLT 137*    Recent Results (from the past 240 hour(s))  MRSA PCR Screening     Status: None   Collection Time: 11/30/18  2:05 AM  Result Value Ref Range Status   MRSA by PCR NEGATIVE NEGATIVE Final    Comment:        The GeneXpert MRSA Assay (FDA approved for NASAL specimens only), is one component of a comprehensive MRSA colonization surveillance program. It is not intended to diagnose MRSA infection nor to guide or monitor treatment for MRSA infections. Performed at [facility], [address] [place], [place] [address]      Hospital Course: This is a  53 year old who has end-stage renal disease on dialysis and who had missed dialysis for several days because of a variety of circumstances.  She had surgery for carpal tunnel syndrome and missed today and then she was getting up to go to dialysis when she fell and suffered a trimalleolar fracture of her ankle.  She became confused and her husband called me and I directed them to come to the emergency department.  She was found to be uremic and was admitted to the hospital and started back on her dialysis program.  I had orthopedics review her chart but they felt that she should probably have her surgery done in [place] because of her multiple comorbidities.  She was dialyzed and her mental status cleared.  It was felt that she would be best served by going to a skilled care facility and that was arranged.  She will need follow-up with her orthopedist in [place] to have further evaluation of her fracture and be set up for potential surgery  Discharge Exam: Blood pressure (!) 114/55, pulse 81, temperature 97.9 F (36.6 C), temperature source  Oral, resp. rate 17, height 5\' 8"  (1.727 m), weight (!) 164.2 kg, last menstrual period 08/03/2008, SpO2 90 %. She is awake and alert.  Chest is clear.  Heart is regular.  Back to normal mental status  Disposition: To skilled care facility.  She will be on a carb modified renal diet.  She will have dialysis as an outpatient.  She will need follow-up with Dr. Lucia Gaskins in [place] for her trimalleolar fracture.  She will have PT OT and speech as needed.   She will also need follow-up with Dr. Burney Gauze who did her carpal tunnel surgery     Signed: Alonza Bogus   12/03/2018, 10:22 AM

## 2018-12-03 NOTE — Progress Notes (Signed)
Subjective: She says she feels okay.  She is going to dialyze again today.  We are trying to get her set for transfer to skilled care facility when a bed is available.  She feels like she may be having some allergic reaction to her pain medication.  She has not had a bowel movement.  Objective: Vital signs in last 24 hours: Temp:  [97.9 F (36.6 C)-99.1 F (37.3 C)] 97.9 F (36.6 C) (05/08 0555) Pulse Rate:  [81-83] 81 (05/08 0555) Resp:  [17-20] 17 (05/08 0555) BP: (114-141)/(50-74) 114/55 (05/08 0555) SpO2:  [90 %-97 %] 90 % (05/08 0555) Weight:  [164.2 kg] 164.2 kg (05/08 0500) Weight change: -3 kg Last BM Date: 11/28/18  Intake/Output from previous day: 05/07 0701 - 05/08 0700 In: 240 [P.O.:240] Out: 100 [Urine:100]  PHYSICAL EXAM General appearance: alert, cooperative, morbidly obese and She has some red spots on her face Resp: clear to auscultation bilaterally Cardio: regular rate and rhythm, S1, S2 normal, no murmur, click, rub or gallop GI: soft, non-tender; bowel sounds normal; no masses,  no organomegaly Extremities: extremities normal, atraumatic, no cyanosis or edema  Lab Results:  Results for orders placed or performed during the hospital encounter of 11/29/18 (from the past 48 hour(s))  Glucose, capillary     Status: Abnormal   Collection Time: 12/01/18 11:04 AM  Result Value Ref Range   Glucose-Capillary 175 (H) 70 - 99 mg/dL  Glucose, capillary     Status: Abnormal   Collection Time: 12/01/18  4:06 PM  Result Value Ref Range   Glucose-Capillary 185 (H) 70 - 99 mg/dL  Glucose, capillary     Status: Abnormal   Collection Time: 12/01/18  9:52 PM  Result Value Ref Range   Glucose-Capillary 175 (H) 70 - 99 mg/dL  Renal function panel     Status: Abnormal   Collection Time: 12/02/18  5:50 AM  Result Value Ref Range   Sodium 138 135 - 145 mmol/L   Potassium 4.5 3.5 - 5.1 mmol/L   Chloride 97 (L) 98 - 111 mmol/L   CO2 28 22 - 32 mmol/L   Glucose, Bld 190 (H)  70 - 99 mg/dL   BUN 52 (H) 6 - 20 mg/dL   Creatinine, Ser 7.09 (H) 0.44 - 1.00 mg/dL   Calcium 9.3 8.9 - 10.3 mg/dL   Phosphorus 8.2 (H) 2.5 - 4.6 mg/dL   Albumin 2.9 (L) 3.5 - 5.0 g/dL   GFR calc non Af Amer 6 (L) >60 mL/min   GFR calc Af Amer 7 (L) >60 mL/min   Anion gap 13 5 - 15    Comment: Performed at [facility], [address] [place], [place] [address]  Glucose, capillary     Status: Abnormal   Collection Time: 12/02/18  7:13 AM  Result Value Ref Range   Glucose-Capillary 169 (H) 70 - 99 mg/dL  Glucose, capillary     Status: Abnormal   Collection Time: 12/02/18 11:09 AM  Result Value Ref Range   Glucose-Capillary 202 (H) 70 - 99 mg/dL  Glucose, capillary     Status: Abnormal   Collection Time: 12/02/18  4:15 PM  Result Value Ref Range   Glucose-Capillary 141 (H) 70 - 99 mg/dL  Glucose, capillary     Status: Abnormal   Collection Time: 12/02/18  9:36 PM  Result Value Ref Range   Glucose-Capillary 155 (H) 70 - 99 mg/dL  Glucose, capillary     Status: Abnormal   Collection Time: 12/03/18  7:28 AM  Result Value Ref Range   Glucose-Capillary 134 (H) 70 - 99 mg/dL    ABGS No results for input(s): PHART, PO2ART, TCO2, HCO3 in the last 72 hours.  Invalid input(s): PCO2 CULTURES Recent Results (from the past 240 hour(s))  MRSA PCR Screening     Status: None   Collection Time: 11/30/18  2:05 AM  Result Value Ref Range Status   MRSA by PCR NEGATIVE NEGATIVE Final    Comment:        The GeneXpert MRSA Assay (FDA approved for NASAL specimens only), is one component of a comprehensive MRSA colonization surveillance program. It is not intended to diagnose MRSA infection nor to guide or monitor treatment for MRSA infections. Performed at [facility], [address], [place], [place] [address]    Studies/Results: No results found.  Medications:  Prior to Admission:  Medications Prior to Admission  Medication Sig Dispense Refill Last Dose  . albuterol (PROVENTIL)  (2.5 MG/3ML) 0.083% nebulizer solution Take 2.5 mg by nebulization every 6 (six) hours as needed for wheezing or shortness of breath.    11/28/2018 at Unknown time  . atorvastatin (LIPITOR) 20 MG tablet Take 20 mg by mouth daily.    11/28/2018 at Unknown time  . busPIRone (BUSPAR) 7.5 MG tablet Take 7.5 mg by mouth 2 (two) times daily.   11/28/2018 at Unknown time  . calcium carbonate (TUMS EX) 750 MG chewable tablet Chew 2-4 tablets by mouth See admin instructions. Patient takes 4 tablets with meals and 2 tablet with snacks three times a day   11/28/2018 at Unknown time  . cetirizine (ZYRTEC) 10 MG chewable tablet Chew 10 mg by mouth daily.   11/28/2018 at Unknown time  . cinacalcet (SENSIPAR) 30 MG tablet Take 30 mg by mouth daily.   11/28/2018 at Unknown time  . colchicine 0.6 MG tablet Take 1 tablet (0.6 mg total) by mouth every Tuesday, Thursday, and Saturday at 6 PM. Take 1 tablet every other day   11/27/2018 at Unknown time  . gabapentin (NEURONTIN) 300 MG capsule Take 300 mg by mouth at bedtime.   Past Week at Unknown time  . HYDROcodone-acetaminophen (NORCO) 10-325 MG tablet Take 1 tablet by mouth every 6 (six) hours as needed.   Past Week at Unknown time  . Insulin Glargine (LANTUS SOLOSTAR) 100 UNIT/ML Solostar Pen Inject 30 Units into the skin every morning. And pen needles 1/day 15 mL 11 11/28/2018 at Unknown time  . lidocaine-prilocaine (EMLA) cream Apply 1 application topically as needed (topical anesthesia for hemodialysis if Gebauers and Lidocaine injection are ineffective.). 30 g 0 Past Week at Unknown time  . losartan (COZAAR) 25 MG tablet Take 25 mg by mouth daily.   11/28/2018 at Unknown time  . omeprazole (PRILOSEC) 40 MG capsule Take 40 mg by mouth daily.   11/28/2018 at Unknown time  . oxyCODONE-acetaminophen (PERCOCET/ROXICET) 5-325 MG tablet Take 1 tablet by mouth every 4 (four) hours as needed for severe pain. 15 tablet 0 Past Week at Unknown time  . PARoxetine (PAXIL) 20 MG tablet Take 1 daily.  This is to prevent panic attacks (Patient taking differently: Take 20 mg by mouth daily. This is to prevent panic attacks) 30 tablet 12 11/28/2018 at Unknown time  . torsemide (DEMADEX) 100 MG tablet Take 100 mg by mouth 2 (two) times daily.    11/28/2018 at Unknown time  . [DISCONTINUED] ALPRAZolam (XANAX) 1 MG tablet Take 0.5 tablets (0.5 mg total) by mouth 2 (  two) times daily as needed for anxiety (nerves). (Patient taking differently: Take 0.5 mg by mouth 4 (four) times daily. ) 30 tablet 0 11/28/2018 at Unknown time  . oxyCODONE-acetaminophen (PERCOCET) 5-325 MG tablet Take 1 tablet by mouth every 4 (four) hours as needed for severe pain. (Patient not taking: Reported on 11/29/2018) 20 tablet 0 Not Taking at Unknown time   Scheduled: . atorvastatin  20 mg Oral Daily  . calcium carbonate  2 tablet Oral With snacks  . calcium carbonate  2 tablet Oral With snacks  . calcium carbonate  4 tablet Oral TID WC  . Chlorhexidine Gluconate Cloth  6 each Topical Q0600  . Chlorhexidine Gluconate Cloth  6 each Topical Q0600  . cinacalcet  30 mg Oral Q breakfast  . doxercalciferol  1 mcg Intravenous Q M,W,F-HD  . epoetin alfa  10,000 Units Intravenous Once per day on Mon Wed Fri  . insulin aspart  0-9 Units Subcutaneous TID WC  . insulin glargine  12 Units Subcutaneous [id number]  . loratadine  10 mg Oral Daily  . losartan  25 mg Oral QHS  . multivitamin  1 tablet Oral QHS  . pantoprazole  40 mg Oral Daily   Continuous: . sodium chloride    . sodium chloride    . ferumoxytol     VZS:MOLMBE chloride, sodium chloride, acetaminophen **OR** acetaminophen, albuterol, alteplase, alum & mag hydroxide-simeth, bisacodyl, heparin, HYDROcodone-acetaminophen, morphine, ondansetron **OR** ondansetron (ZOFRAN) IV, pentafluoroprop-tetrafluoroeth, polyethylene glycol  Assesment: She was admitted with metabolic encephalopathy likely from uremia.  She is on dialysis at baseline.  She is going to do dialysis again today.  Her  metabolic encephalopathy is much improved  She has diabetes which is stable  She may be having an allergic reaction to pain medication. Active Problems:   Morbid obesity ([place])   RESTLESS LEG SYNDROME   Essential hypertension   Sleep apnea   Type 2 diabetes mellitus treated with insulin (HCC)   ESRD (end stage renal disease) on dialysis ([place])   History of enucleation of left eyeball   Metabolic encephalopathy    Plan: Change her pain meds.  She can have Benadryl.  She will have something for constipation.  Transfer to skilled care facility when bed is available.    LOS: 3 days   Alonza Bogus 12/03/2018, 10:06 AM

## 2018-12-03 NOTE — Procedures (Signed)
    HEMODIALYSIS TREATMENT NOTE:  4 hour low-heparin dialysis completed via right chest wall tunneled catheter. Goal NOT met: UF was interrupted x20 minutes for hypotension.  Net UF 3.8L with administration of Albumin 50g x1.  0.7kg over EDW.  All blood was returned.  Rockwell Alexandria, RN

## 2018-12-03 NOTE — TOC Progression Note (Signed)
Transition of Care [facility]) - Progression Note    Patient Details  Name: Martha [name] MRN: [id number] Date of Birth: 1966-05-13  Transition of Care [facility]) CM/SW Contact  Roda Shutters Margretta Sidle, RN Phone Number: 12/03/2018, 1:10 PM  Clinical Narrative:   Pt has been declined by [facility], [place], [place]. [facility] is still looking at pt. Was supposed to come see pt so that they could submit to administration a request to accept. Did not visit afternoon of 8/7 or morning of 5/8 as indicated by rep. With pt's permission search has been extended to [employer]. SNF's ([facility], [facility], [place], [place]). TOC will cont to follow.      Expected Discharge Plan: Skilled Nursing Facility Barriers to Discharge: No SNF bed  Expected Discharge Plan and Services Expected Discharge Plan: [place] [facility]: [place] arrangements for the past 2 months: Single Family Home Expected Discharge Date: 12/03/18

## 2018-12-04 LAB — GLUCOSE, CAPILLARY
Glucose-Capillary: 152 mg/dL — ABNORMAL HIGH (ref 70–99)
Glucose-Capillary: 148 mg/dL — ABNORMAL HIGH (ref 70–99)
Glucose-Capillary: 148 mg/dL — ABNORMAL HIGH (ref 70–99)
Glucose-Capillary: 128 mg/dL — ABNORMAL HIGH (ref 70–99)

## 2018-12-04 MED ORDER — ALPRAZOLAM 0.5 MG PO TABS
0.5000 mg | ORAL_TABLET | Freq: Once | ORAL | Status: AC
  Administered 2018-12-04: 0.5 mg via ORAL
  Filled 2018-12-04: qty 1

## 2018-12-04 NOTE — Discharge Summary (Addendum)
Physician Discharge Summary  Patient ID: Martha [name] MRN: [id number] DOB/AGE: April 11, 1966 53 y.o. Primary Care Physician:Elward Nocera, Percell Miller, MD Admit date: 11/29/2018 Discharge date: 12/04/2018    Discharge Diagnoses:   Active Problems:   Morbid obesity ([place])   RESTLESS LEG SYNDROME   Essential hypertension   Sleep apnea   Type 2 diabetes mellitus treated with insulin (HCC)   ESRD (end stage renal disease) on dialysis [facility])   History of enucleation of left eyeball   Metabolic encephalopathy Chronic low back pain Obstipation Anxiety Depression  Allergies as of 12/04/2018      Reactions   Codeine Nausea And Vomiting   Tape Rash, Other (See Comments)   Pull skin off.  Paper tape is ok      Medication List    STOP taking these medications   oxyCODONE-acetaminophen 5-325 MG tablet Commonly known as:  PERCOCET/ROXICET     TAKE these medications   acetaminophen 325 MG tablet Commonly known as:  TYLENOL Take 2 tablets (650 mg total) by mouth every 6 (six) hours as needed for mild pain (or Fever >/= 101).   albuterol (2.5 MG/3ML) 0.083% nebulizer solution Commonly known as:  PROVENTIL Take 2.5 mg by nebulization every 6 (six) hours as needed for wheezing or shortness of breath.   ALPRAZolam 1 MG tablet Commonly known as:  XANAX Take 0.5 tablets (0.5 mg total) by mouth 4 (four) times daily.   atorvastatin 20 MG tablet Commonly known as:  LIPITOR Take 20 mg by mouth daily.   bisacodyl 10 MG suppository Commonly known as:  DULCOLAX Place 1 suppository (10 mg total) rectally daily as needed for moderate constipation.   busPIRone 7.5 MG tablet Commonly known as:  BUSPAR Take 7.5 mg by mouth 2 (two) times daily.   calcium carbonate 750 MG chewable tablet Commonly known as:  TUMS EX Chew 2-4 tablets by mouth See admin instructions. Patient takes 4 tablets with meals and 2 tablet with snacks three times a day   cetirizine 10 MG chewable tablet Commonly known as:   ZYRTEC Chew 10 mg by mouth daily.   cinacalcet 30 MG tablet Commonly known as:  SENSIPAR Take 30 mg by mouth daily.   colchicine 0.6 MG tablet Take 1 tablet (0.6 mg total) by mouth every Tuesday, Thursday, and Saturday at 6 PM. Take 1 tablet every other day   diphenhydrAMINE 25 mg capsule Commonly known as:  BENADRYL Take 1 capsule (25 mg total) by mouth every 6 (six) hours as needed for itching.   doxercalciferol 4 MCG/2ML injection Commonly known as:  HECTOROL Inject 0.5 mLs (1 mcg total) into the vein every Monday, Wednesday, and Friday with hemodialysis.   epoetin alfa 10000 UNIT/ML injection Commonly known as:  EPOGEN Inject 1 mL (10,000 Units total) into the vein 3 (three) times a week.   gabapentin 300 MG capsule Commonly known as:  NEURONTIN Take 300 mg by mouth at bedtime.   HYDROcodone-acetaminophen 10-325 MG tablet Commonly known as:  NORCO Take 1 tablet by mouth every 6 (six) hours as needed.   insulin aspart 100 UNIT/ML injection Commonly known as:  novoLOG Inject 0-9 Units into the skin 3 (three) times daily with meals.   Insulin Glargine 100 UNIT/ML Solostar Pen Commonly known as:  Lantus SoloStar Inject 30 Units into the skin every morning. And pen needles 1/day   lidocaine-prilocaine cream Commonly known as:  EMLA Apply 1 application topically as needed (topical anesthesia for hemodialysis if Gebauers and Lidocaine injection are  ineffective.).   losartan 25 MG tablet Commonly known as:  COZAAR Take 25 mg by mouth daily.   omeprazole 40 MG capsule Commonly known as:  PRILOSEC Take 40 mg by mouth daily.   ondansetron 4 MG tablet Commonly known as:  ZOFRAN Take 1 tablet (4 mg total) by mouth every 6 (six) hours as needed for nausea.   PARoxetine 20 MG tablet Commonly known as:  Paxil Take 1 daily. This is to prevent panic attacks What changed:    how much to take  how to take this  when to take this  additional instructions    polyethylene glycol 17 g packet Commonly known as:  MIRALAX / GLYCOLAX Take 17 g by mouth daily as needed for mild constipation.   torsemide 100 MG tablet Commonly known as:  DEMADEX Take 100 mg by mouth 2 (two) times daily.            Durable Medical Equipment  (From admission, onward)         Start     Ordered   11/30/18 1122  For home use only DME Other see comment  Once    Comments:  Hoyer lift - (bariatric if qualifies); ht: 5'8", wt: 168kg   11/30/18 1123          Discharged Condition: Improved    Consults: Nephrology  Significant Diagnostic Studies: Dg Ankle Complete Right  Result Date: 11/29/2018 CLINICAL DATA:  Fall this morning with swelling and bruising to the right ankle EXAM: RIGHT ANKLE - COMPLETE 3+ VIEW COMPARISON:  None FINDINGS: Oblique fracture through the distal fibular shaft. There is a transverse fracture through the medial malleolus. Widening of the medial clear space. Posterior malleolus fracture is present and retracted proximally. Located hindfoot.  Heel spurs. IMPRESSION: Trimalleolar fracture with widening of the medial clear space. Electronically Signed   By: Monte Fantasia M.D.   On: 11/29/2018 07:28   Ct Head Wo Contrast  Result Date: 11/29/2018 CLINICAL DATA:  Blunt maxillofacial trauma EXAM: CT HEAD WITHOUT CONTRAST CT CERVICAL SPINE WITHOUT CONTRAST TECHNIQUE: Multidetector CT imaging of the head and cervical spine was performed following the standard protocol without intravenous contrast. Multiplanar CT image reconstructions of the cervical spine were also generated. COMPARISON:  Head CT 11/28/2018.  Cervical spine CT 06/21/2015 FINDINGS: CT HEAD FINDINGS Brain: No evidence of acute infarction, hemorrhage, hydrocephalus, extra-axial collection or mass lesion/mass effect. Vascular: No hyperdense vessel or unexpected calcification. Skull: Negative Sinuses/Orbits: Left nucleation with prosthesis. Right-sided oil tamponade. CT CERVICAL SPINE  FINDINGS Alignment: Normal Skull base and vertebrae: Negative for acute fracture Soft tissues and spinal canal: No prevertebral fluid or swelling. No visible canal hematoma. 22 mm left thyroid nodule without worrisome growth since cervical spine CT in 2016. Disc levels:  No notable degenerative changes Upper chest: Negative IMPRESSION: No evidence of acute intracranial or cervical spine injury. Electronically Signed   By: Monte Fantasia M.D.   On: 11/29/2018 07:41   Ct Head Wo Contrast  Result Date: 11/28/2018 CLINICAL DATA:  Jerking and tremors that began today, end-stage renal disease on dialysis, CHF, type II diabetes mellitus, hypertension, COPD EXAM: CT HEAD WITHOUT CONTRAST TECHNIQUE: Contiguous axial images were obtained from the base of the skull through the vertex without intravenous contrast. Sagittal and coronal MPR images reconstructed from axial data set. COMPARISON:  09/26/2018 FINDINGS: Brain: Normal ventricular morphology. No midline shift or mass effect. Normal appearance of brain parenchyma. No intracranial hemorrhage, mass lesion or evidence of acute infarction.  No extra-axial fluid collections. Vascular: No abnormalities Skull: Intact Sinuses/Orbits: Clear. 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 Chronic increased attenuation of the RIGHT optic globe again noted. IMPRESSION: No acute intracranial mallet. Electronically Signed   By: Lavonia Dana M.D.   On: 11/28/2018 19:58   Ct Cervical Spine Wo Contrast  Result Date: 11/29/2018 CLINICAL DATA:  Blunt maxillofacial trauma EXAM: CT HEAD WITHOUT CONTRAST CT CERVICAL SPINE WITHOUT CONTRAST TECHNIQUE: Multidetector CT imaging of the head and cervical spine was performed following the standard protocol without intravenous contrast. Multiplanar CT image reconstructions of the cervical spine were also generated. COMPARISON:  Head CT 11/28/2018.  Cervical spine CT 06/21/2015 FINDINGS: CT HEAD FINDINGS  Brain: No evidence of acute infarction, hemorrhage, hydrocephalus, extra-axial collection or mass lesion/mass effect. Vascular: No hyperdense vessel or unexpected calcification. Skull: Negative Sinuses/Orbits: Left nucleation with prosthesis. Right-sided oil tamponade. CT CERVICAL SPINE FINDINGS Alignment: Normal Skull base and vertebrae: Negative for acute fracture Soft tissues and spinal canal: No prevertebral fluid or swelling. No visible canal hematoma. 22 mm left thyroid nodule without worrisome growth since cervical spine CT in 2016. Disc levels:  No notable degenerative changes Upper chest: Negative IMPRESSION: No evidence of acute intracranial or cervical spine injury. Electronically Signed   By: Monte Fantasia M.D.   On: 11/29/2018 07:41   Dg Chest Port 1 View  Result Date: 11/29/2018 CLINICAL DATA:  Shortness of breath, weakness, history hypertension, CHF, COPD, former smoker EXAM: PORTABLE CHEST 1 VIEW COMPARISON:  Portable exam 1939 hours compared to 09/28/2018 FINDINGS: RIGHT jugular line with tip projecting over SVC. Enlargement of cardiac silhouette with pulmonary vascular congestion. Mediastinal contours normal. Lungs clear. No pleural effusion or pneumothorax. Bones unremarkable. IMPRESSION: Enlargement of cardiac silhouette with pulmonary vascular congestion. No acute infiltrate. Electronically Signed   By: Lavonia Dana M.D.   On: 11/29/2018 19:46    Lab Results: Basic Metabolic Panel: Recent Labs    12/02/18 0550 12/03/18 1213  NA 138 137  K 4.5 4.6  CL 97* 93*  CO2 28 28  GLUCOSE 190* 204*  BUN 52* 71*  CREATININE 7.09* 9.21*  CALCIUM 9.3 9.2  PHOS 8.2* 9.5*   Liver Function Tests: Recent Labs    12/02/18 0550 12/03/18 1213  ALBUMIN 2.9* 3.2*     CBC: Recent Labs    12/01/18 1005 12/03/18 1213  WBC 6.0 7.2  HGB 8.0* 8.3*  HCT 25.8* 27.1*  MCV 92.1 93.4  PLT 137* 168    Recent Results (from the past 240 hour(s))  MRSA PCR Screening     Status: None    Collection Time: 11/30/18  2:05 AM  Result Value Ref Range Status   MRSA by PCR NEGATIVE NEGATIVE Final    Comment:        The GeneXpert MRSA Assay (FDA approved for NASAL specimens only), is one component of a comprehensive MRSA colonization surveillance program. It is not intended to diagnose MRSA infection nor to guide or monitor treatment for MRSA infections. Performed at [facility], [address] [place], [place] [address]   Novel Coronavirus, NAA (hospital order; send-out to ref lab)     Status: None   Collection Time: 12/02/18  8:14 AM  Result Value Ref Range Status   SARS-CoV-2, NAA NOT DETECTED NOT DETECTED Final    Comment: (NOTE) This test was developed and its performance characteristics determined by [employer]. This test has not been FDA cleared or approved. This  test has been authorized by FDA under an Emergency Use Authorization (EUA). This test is only authorized for the duration of time the declaration that circumstances exist justifying the authorization of the emergency use of in vitro diagnostic tests for detection of SARS-CoV-2 virus and/or diagnosis of COVID-19 infection under section 564(b)(1) of the Act, 21 U.S.C. 573UKG-2(R)(4), unless the authorization is terminated or revoked sooner. When diagnostic testing is negative, the possibility of a false negative result should be considered in the context of a patient's recent exposures and the presence of clinical signs and symptoms consistent with COVID-19. An individual without symptoms of COVID-19 and who is not shedding SARS-CoV-2 virus would expect to have a negative (not detected) result in this assay. Performed  At: [facility] [place], [place] [id number] Rush Farmer MD GB:[id number]    [place]  Final    Comment: Performed at [facility], [address] [place], [place] [address]     Hospital Course: This is a 53 year old who was  in her usual state of poor health at home when she missed several dialysis sessions because of a variety of reasons.  First she had a surgery for carpal tunnel syndrome and missed dialysis from that and then she became shaky and weak and it she was getting up to go to dialysis she tripped and fell and suffered a trimalleolar fracture of her ankle on the right.  She was taken to the emergency department at [facility] had evaluation by orthopedics with plans to be set up for surgery.  However she developed increasing problems with confusion and some agitation and came to the emergency department.  She was found to be uremic.  She had dialysis x3 while she was in the hospital and her mental status is back to baseline.  She had trouble with constipation but has had results from an enema now.  It was clear that she was not going to be able to manage at home and plans were made for a nursing home stay for rehab.  She will follow-up with her orthopedist next week with potential surgery to follow that.  Discharge Exam: Blood pressure (!) 144/69, pulse 85, temperature 98.2 F (36.8 C), temperature source Oral, resp. rate 16, height 5\' 8"  (1.727 m), weight (!) 161.7 kg, last menstrual period 08/03/2008, SpO2 90 %. She is awake and alert.  She is morbidly obese.  She has a wrap on her left wrist in a splint on her right ankle.  Mental status is back to normal  Disposition: To skilled care facility.  She will need PT OT and potentially speech.  She will continue with her home CPAP.  She will need appointment with orthopedic surgeon Dr. Lucia Gaskins next week.  She will need to continue her dialysis schedule Monday Wednesday and Friday  D/c summary updated today 12/08/2018      Signed: Alonza Bogus   12/04/2018, 9:39 AM

## 2018-12-04 NOTE — Progress Notes (Signed)
Subjective: She feels much better.  She still had some trouble with dialysis yesterday.  No other new complaints.  She did finally have a bowel movement and feels better.  She still has pain in her wrist.  The pain in her right ankle is somewhat better.  Objective: Vital signs in last 24 hours: Temp:  [98 F (36.7 C)-98.2 F (36.8 C)] 98.2 F (36.8 C) (05/09 0617) Pulse Rate:  [73-86] 85 (05/09 0617) Resp:  [16] 16 (05/08 1545) BP: (80-158)/(42-69) 144/69 (05/09 0617) SpO2:  [90 %-94 %] 90 % (05/09 0617) Weight:  [157.7 kg-161.7 kg] 161.7 kg (05/09 0500) Weight change: -3.1 kg Last BM Date: 12/03/18  Intake/Output from previous day: 05/08 0701 - 05/09 0700 In: 767 [P.O.:600; IV Piggyback:167] Out: 4203 [Urine:400; Stool:1]  PHYSICAL EXAM General appearance: alert, cooperative, no distress and morbidly obese Resp: clear to auscultation bilaterally Cardio: regular rate and rhythm, S1, S2 normal, no murmur, click, rub or gallop GI: soft, non-tender; bowel sounds normal; no masses,  no organomegaly Extremities: She has wrapping on her left wrist and a splint on her right ankle  Lab Results:  Results for orders placed or performed during the hospital encounter of 11/29/18 (from the past 48 hour(s))  Glucose, capillary     Status: Abnormal   Collection Time: 12/02/18 11:09 AM  Result Value Ref Range   Glucose-Capillary 202 (H) 70 - 99 mg/dL  Glucose, capillary     Status: Abnormal   Collection Time: 12/02/18  4:15 PM  Result Value Ref Range   Glucose-Capillary 141 (H) 70 - 99 mg/dL  Glucose, capillary     Status: Abnormal   Collection Time: 12/02/18  9:36 PM  Result Value Ref Range   Glucose-Capillary 155 (H) 70 - 99 mg/dL  Glucose, capillary     Status: Abnormal   Collection Time: 12/03/18  7:28 AM  Result Value Ref Range   Glucose-Capillary 134 (H) 70 - 99 mg/dL  Glucose, capillary     Status: Abnormal   Collection Time: 12/03/18 11:08 AM  Result Value Ref Range   Glucose-Capillary 188 (H) 70 - 99 mg/dL  Renal function panel     Status: Abnormal   Collection Time: 12/03/18 12:13 PM  Result Value Ref Range   Sodium 137 135 - 145 mmol/L   Potassium 4.6 3.5 - 5.1 mmol/L   Chloride 93 (L) 98 - 111 mmol/L   CO2 28 22 - 32 mmol/L   Glucose, Bld 204 (H) 70 - 99 mg/dL   BUN 71 (H) 6 - 20 mg/dL   Creatinine, Ser 9.21 (H) 0.44 - 1.00 mg/dL   Calcium 9.2 8.9 - 10.3 mg/dL   Phosphorus 9.5 (H) 2.5 - 4.6 mg/dL   Albumin 3.2 (L) 3.5 - 5.0 g/dL   GFR calc non Af Amer 4 (L) >60 mL/min   GFR calc Af Amer 5 (L) >60 mL/min   Anion gap 16 (H) 5 - 15    Comment: Performed at [facility], [address] [place], [place] [address]  CBC     Status: Abnormal   Collection Time: 12/03/18 12:13 PM  Result Value Ref Range   WBC 7.2 4.0 - 10.5 K/uL   RBC 2.90 (L) 3.87 - 5.11 MIL/uL   Hemoglobin 8.3 (L) 12.0 - 15.0 g/dL   HCT 27.1 (L) 36.0 - 46.0 %   MCV 93.4 80.0 - 100.0 fL   MCH 28.6 26.0 - 34.0 pg   MCHC 30.6 30.0 - 36.0 g/dL  RDW 16.8 (H) 11.5 - 15.5 %   Platelets 168 150 - 400 K/uL   nRBC 0.0 0.0 - 0.2 %    Comment: Performed at [facility], [address], [place], [place] [address]  Glucose, capillary     Status: Abnormal   Collection Time: 12/03/18  4:17 PM  Result Value Ref Range   Glucose-Capillary 136 (H) 70 - 99 mg/dL  Glucose, capillary     Status: Abnormal   Collection Time: 12/03/18 10:06 PM  Result Value Ref Range   Glucose-Capillary 134 (H) 70 - 99 mg/dL  Glucose, capillary     Status: Abnormal   Collection Time: 12/04/18  7:59 AM  Result Value Ref Range   Glucose-Capillary 152 (H) 70 - 99 mg/dL    ABGS No results for input(s): PHART, PO2ART, TCO2, HCO3 in the last 72 hours.  Invalid input(s): PCO2 CULTURES Recent Results (from the past 240 hour(s))  MRSA PCR Screening     Status: None   Collection Time: 11/30/18  2:05 AM  Result Value Ref Range Status   MRSA by PCR NEGATIVE NEGATIVE Final    Comment:        The GeneXpert MRSA Assay  (FDA approved for NASAL specimens only), is one component of a comprehensive MRSA colonization surveillance program. It is not intended to diagnose MRSA infection nor to guide or monitor treatment for MRSA infections. Performed at [facility], [address], [place], [place] [address]   Novel Coronavirus, NAA (hospital order; send-out to ref lab)     Status: None   Collection Time: 12/02/18  8:14 AM  Result Value Ref Range Status   SARS-CoV-2, NAA NOT DETECTED NOT DETECTED Final    Comment: (NOTE) This test was developed and its performance characteristics determined by [employer]. This test has not been FDA cleared or approved. This test has been authorized by FDA under an Emergency Use Authorization (EUA). This test is only authorized for the duration of time the declaration that circumstances exist justifying the authorization of the emergency use of in vitro diagnostic tests for detection of SARS-CoV-2 virus and/or diagnosis of COVID-19 infection under section 564(b)(1) of the Act, 21 U.S.C. 536UYQ-0(H)(4), unless the authorization is terminated or revoked sooner. When diagnostic testing is negative, the possibility of a false negative result should be considered in the context of a patient's recent exposures and the presence of clinical signs and symptoms consistent with COVID-19. An individual without symptoms of COVID-19 and who is not shedding SARS-CoV-2 virus would expect to have a negative (not detected) result in this assay. Performed  At: [facility] [place], [place] [id number] Rush Farmer MD VF:[id number]    [place]  Final    Comment: Performed at [facility], [address], [place], [place] [address]   Studies/Results: No results found.  Medications:  Prior to Admission:  Medications Prior to Admission  Medication Sig Dispense Refill Last Dose  . albuterol (PROVENTIL) (2.5 MG/3ML) 0.083%  nebulizer solution Take 2.5 mg by nebulization every 6 (six) hours as needed for wheezing or shortness of breath.    11/28/2018 at Unknown time  . atorvastatin (LIPITOR) 20 MG tablet Take 20 mg by mouth daily.    11/28/2018 at Unknown time  . busPIRone (BUSPAR) 7.5 MG tablet Take 7.5 mg by mouth 2 (two) times daily.   11/28/2018 at Unknown time  . calcium carbonate (TUMS EX) 750 MG chewable tablet Chew 2-4 tablets by mouth See admin instructions.  Patient takes 4 tablets with meals and 2 tablet with snacks three times a day   11/28/2018 at Unknown time  . cetirizine (ZYRTEC) 10 MG chewable tablet Chew 10 mg by mouth daily.   11/28/2018 at Unknown time  . cinacalcet (SENSIPAR) 30 MG tablet Take 30 mg by mouth daily.   11/28/2018 at Unknown time  . colchicine 0.6 MG tablet Take 1 tablet (0.6 mg total) by mouth every Tuesday, Thursday, and Saturday at 6 PM. Take 1 tablet every other day   11/27/2018 at Unknown time  . gabapentin (NEURONTIN) 300 MG capsule Take 300 mg by mouth at bedtime.   Past Week at Unknown time  . HYDROcodone-acetaminophen (NORCO) 10-325 MG tablet Take 1 tablet by mouth every 6 (six) hours as needed.   Past Week at Unknown time  . Insulin Glargine (LANTUS SOLOSTAR) 100 UNIT/ML Solostar Pen Inject 30 Units into the skin every morning. And pen needles 1/day 15 mL 11 11/28/2018 at Unknown time  . lidocaine-prilocaine (EMLA) cream Apply 1 application topically as needed (topical anesthesia for hemodialysis if Gebauers and Lidocaine injection are ineffective.). 30 g 0 Past Week at Unknown time  . losartan (COZAAR) 25 MG tablet Take 25 mg by mouth daily.   11/28/2018 at Unknown time  . omeprazole (PRILOSEC) 40 MG capsule Take 40 mg by mouth daily.   11/28/2018 at Unknown time  . oxyCODONE-acetaminophen (PERCOCET/ROXICET) 5-325 MG tablet Take 1 tablet by mouth every 4 (four) hours as needed for severe pain. 15 tablet 0 Past Week at Unknown time  . PARoxetine (PAXIL) 20 MG tablet Take 1 daily. This is to prevent  panic attacks (Patient taking differently: Take 20 mg by mouth daily. This is to prevent panic attacks) 30 tablet 12 11/28/2018 at Unknown time  . torsemide (DEMADEX) 100 MG tablet Take 100 mg by mouth 2 (two) times daily.    11/28/2018 at Unknown time  . [DISCONTINUED] ALPRAZolam (XANAX) 1 MG tablet Take 0.5 tablets (0.5 mg total) by mouth 2 (two) times daily as needed for anxiety (nerves). (Patient taking differently: Take 0.5 mg by mouth 4 (four) times daily. ) 30 tablet 0 11/28/2018 at Unknown time  . oxyCODONE-acetaminophen (PERCOCET) 5-325 MG tablet Take 1 tablet by mouth every 4 (four) hours as needed for severe pain. (Patient not taking: Reported on 11/29/2018) 20 tablet 0 Not Taking at Unknown time   Scheduled: . atorvastatin  20 mg Oral Daily  . calcium carbonate  2 tablet Oral With snacks  . calcium carbonate  2 tablet Oral With snacks  . calcium carbonate  4 tablet Oral TID WC  . Chlorhexidine Gluconate Cloth  6 each Topical Q0600  . Chlorhexidine Gluconate Cloth  6 each Topical Q0600  . cinacalcet  30 mg Oral Q breakfast  . doxercalciferol  1 mcg Intravenous Q M,W,F-HD  . epoetin alfa  10,000 Units Intravenous Once per day on Mon Wed Fri  . insulin aspart  0-9 Units Subcutaneous TID WC  . insulin glargine  12 Units Subcutaneous BH-q7a  . loratadine  10 mg Oral Daily  . losartan  25 mg Oral QHS  . multivitamin  1 tablet Oral QHS  . pantoprazole  40 mg Oral Daily  . sodium phosphate  1 enema Rectal Once   Continuous: . sodium chloride    . sodium chloride     OIN:OMVEHM chloride, sodium chloride, acetaminophen **OR** acetaminophen, albuterol, alteplase, alum & mag hydroxide-simeth, bisacodyl, bisacodyl, diphenhydrAMINE, heparin, heparin, HYDROcodone-acetaminophen, HYDROmorphone, ondansetron **OR** ondansetron (  ZOFRAN) IV, pentafluoroprop-tetrafluoroeth, polyethylene glycol  Assesment: She was admitted with metabolic encephalopathy likely related to uremia.  She had missed multiple  dialysis sessions.  She has done dialysis here and she is much better.  She is ready to be back on her normal Monday Wednesday Friday schedule.  She had a recent surgery for carpal tunnel syndrome on the left wrist.  She fell and has a trimalleolar fracture of her right ankle  She is going to need placement because she is not really able to manage at home with transfers etc.  She has end-stage renal disease on dialysis  She has diabetes which is on sliding scale  She has sleep apnea on CPAP  She has had trouble with anxiety and depression which is stable  He has chronic low back pain on pain medication Active Problems:   Morbid obesity ([place])   RESTLESS LEG SYNDROME   Essential hypertension   Sleep apnea   Type 2 diabetes mellitus treated with insulin (HCC)   ESRD (end stage renal disease) on dialysis ([place])   History of enucleation of left eyeball   Metabolic encephalopathy    Plan: Continue current treatments.  Ready for transfer to skilled care facility when bed available.    LOS: 4 days   Alonza Bogus 12/04/2018, 9:34 AM

## 2018-12-05 LAB — GLUCOSE, CAPILLARY
Glucose-Capillary: 109 mg/dL — ABNORMAL HIGH (ref 70–99)
Glucose-Capillary: 110 mg/dL — ABNORMAL HIGH (ref 70–99)
Glucose-Capillary: 109 mg/dL — ABNORMAL HIGH (ref 70–99)
Glucose-Capillary: 134 mg/dL — ABNORMAL HIGH (ref 70–99)

## 2018-12-05 NOTE — Progress Notes (Signed)
Subjective: She feels about the same.  She has been more anxious.  She is frustrated because she does not have a skilled care facility bed yet.  She wants to get out of bed  Objective: Vital signs in last 24 hours: Temp:  [98.1 F (36.7 C)-98.2 F (36.8 C)] 98.2 F (36.8 C) (05/09 2120) Pulse Rate:  [74-89] 80 (05/10 0828) Resp:  [18] 18 (05/10 0828) BP: (139-141)/(62-63) 139/62 (05/09 2120) SpO2:  [92 %-97 %] 97 % (05/10 0828) Weight change:  Last BM Date: 12/03/18  Intake/Output from previous day: 05/09 0701 - 05/10 0700 In: 840 [P.O.:840] Out: -   PHYSICAL EXAM General appearance: alert and Anxious Resp: clear to auscultation bilaterally Cardio: regular rate and rhythm, S1, S2 normal, no murmur, click, rub or gallop GI: soft, non-tender; bowel sounds normal; no masses,  no organomegaly Extremities: extremities normal, atraumatic, no cyanosis or edema  Lab Results:  Results for orders placed or performed during the hospital encounter of 11/29/18 (from the past 48 hour(s))  Glucose, capillary     Status: Abnormal   Collection Time: 12/03/18 11:08 AM  Result Value Ref Range   Glucose-Capillary 188 (H) 70 - 99 mg/dL  Renal function panel     Status: Abnormal   Collection Time: 12/03/18 12:13 PM  Result Value Ref Range   Sodium 137 135 - 145 mmol/L   Potassium 4.6 3.5 - 5.1 mmol/L   Chloride 93 (L) 98 - 111 mmol/L   CO2 28 22 - 32 mmol/L   Glucose, Bld 204 (H) 70 - 99 mg/dL   BUN 71 (H) 6 - 20 mg/dL   Creatinine, Ser 9.21 (H) 0.44 - 1.00 mg/dL   Calcium 9.2 8.9 - 10.3 mg/dL   Phosphorus 9.5 (H) 2.5 - 4.6 mg/dL   Albumin 3.2 (L) 3.5 - 5.0 g/dL   GFR calc non Af Amer 4 (L) >60 mL/min   GFR calc Af Amer 5 (L) >60 mL/min   Anion gap 16 (H) 5 - 15    Comment: Performed at [facility], [address], [place], [place] [address]  CBC     Status: Abnormal   Collection Time: 12/03/18 12:13 PM  Result Value Ref Range   WBC 7.2 4.0 - 10.5 K/uL   RBC 2.90 (L) 3.87 - 5.11  MIL/uL   Hemoglobin 8.3 (L) 12.0 - 15.0 g/dL   HCT 27.1 (L) 36.0 - 46.0 %   MCV 93.4 80.0 - 100.0 fL   MCH 28.6 26.0 - 34.0 pg   MCHC 30.6 30.0 - 36.0 g/dL   RDW 16.8 (H) 11.5 - 15.5 %   Platelets 168 150 - 400 K/uL   nRBC 0.0 0.0 - 0.2 %    Comment: Performed at [facility], [address] [place], [place] [address]  Glucose, capillary     Status: Abnormal   Collection Time: 12/03/18  4:17 PM  Result Value Ref Range   Glucose-Capillary 136 (H) 70 - 99 mg/dL  Glucose, capillary     Status: Abnormal   Collection Time: 12/03/18 10:06 PM  Result Value Ref Range   Glucose-Capillary 134 (H) 70 - 99 mg/dL  Glucose, capillary     Status: Abnormal   Collection Time: 12/04/18  7:59 AM  Result Value Ref Range   Glucose-Capillary 152 (H) 70 - 99 mg/dL  Glucose, capillary     Status: Abnormal   Collection Time: 12/04/18 11:54 AM  Result Value Ref Range   Glucose-Capillary 148 (H) 70 -  99 mg/dL  Glucose, capillary     Status: Abnormal   Collection Time: 12/04/18  4:42 PM  Result Value Ref Range   Glucose-Capillary 148 (H) 70 - 99 mg/dL  Glucose, capillary     Status: Abnormal   Collection Time: 12/04/18  9:16 PM  Result Value Ref Range   Glucose-Capillary 128 (H) 70 - 99 mg/dL   Comment 1 Notify RN    Comment 2 Document in Chart   Glucose, capillary     Status: Abnormal   Collection Time: 12/05/18  7:47 AM  Result Value Ref Range   Glucose-Capillary 109 (H) 70 - 99 mg/dL    ABGS No results for input(s): PHART, PO2ART, TCO2, HCO3 in the last 72 hours.  Invalid input(s): PCO2 CULTURES Recent Results (from the past 240 hour(s))  MRSA PCR Screening     Status: None   Collection Time: 11/30/18  2:05 AM  Result Value Ref Range Status   MRSA by PCR NEGATIVE NEGATIVE Final    Comment:        The GeneXpert MRSA Assay (FDA approved for NASAL specimens only), is one component of a comprehensive MRSA colonization surveillance program. It is not intended to diagnose MRSA infection  nor to guide or monitor treatment for MRSA infections. Performed at [facility], [address], [place], [place] [address]   Novel Coronavirus, NAA (hospital order; send-out to ref lab)     Status: None   Collection Time: 12/02/18  8:14 AM  Result Value Ref Range Status   SARS-CoV-2, NAA NOT DETECTED NOT DETECTED Final    Comment: (NOTE) This test was developed and its performance characteristics determined by [employer]. This test has not been FDA cleared or approved. This test has been authorized by FDA under an Emergency Use Authorization (EUA). This test is only authorized for the duration of time the declaration that circumstances exist justifying the authorization of the emergency use of in vitro diagnostic tests for detection of SARS-CoV-2 virus and/or diagnosis of COVID-19 infection under section 564(b)(1) of the Act, 21 U.S.C. 784ONG-2(X)(5), unless the authorization is terminated or revoked sooner. When diagnostic testing is negative, the possibility of a false negative result should be considered in the context of a patient's recent exposures and the presence of clinical signs and symptoms consistent with COVID-19. An individual without symptoms of COVID-19 and who is not shedding SARS-CoV-2 virus would expect to have a negative (not detected) result in this assay. Performed  At: [facility] [place], [place] [id number] Rush Farmer MD NU:[id number]    [place]  Final    Comment: Performed at [facility], [address], [place], [place] [address]   Studies/Results: No results found.  Medications:  Prior to Admission:  Medications Prior to Admission  Medication Sig Dispense Refill Last Dose  . albuterol (PROVENTIL) (2.5 MG/3ML) 0.083% nebulizer solution Take 2.5 mg by nebulization every 6 (six) hours as needed for wheezing or shortness of breath.    11/28/2018 at Unknown time  . atorvastatin (LIPITOR) 20 MG  tablet Take 20 mg by mouth daily.    11/28/2018 at Unknown time  . busPIRone (BUSPAR) 7.5 MG tablet Take 7.5 mg by mouth 2 (two) times daily.   11/28/2018 at Unknown time  . calcium carbonate (TUMS EX) 750 MG chewable tablet Chew 2-4 tablets by mouth See admin instructions. Patient takes 4 tablets with meals and 2 tablet with snacks three times a day   11/28/2018 at Unknown  time  . cetirizine (ZYRTEC) 10 MG chewable tablet Chew 10 mg by mouth daily.   11/28/2018 at Unknown time  . cinacalcet (SENSIPAR) 30 MG tablet Take 30 mg by mouth daily.   11/28/2018 at Unknown time  . colchicine 0.6 MG tablet Take 1 tablet (0.6 mg total) by mouth every Tuesday, Thursday, and Saturday at 6 PM. Take 1 tablet every other day   11/27/2018 at Unknown time  . gabapentin (NEURONTIN) 300 MG capsule Take 300 mg by mouth at bedtime.   Past Week at Unknown time  . HYDROcodone-acetaminophen (NORCO) 10-325 MG tablet Take 1 tablet by mouth every 6 (six) hours as needed.   Past Week at Unknown time  . Insulin Glargine (LANTUS SOLOSTAR) 100 UNIT/ML Solostar Pen Inject 30 Units into the skin every morning. And pen needles 1/day 15 mL 11 11/28/2018 at Unknown time  . lidocaine-prilocaine (EMLA) cream Apply 1 application topically as needed (topical anesthesia for hemodialysis if Gebauers and Lidocaine injection are ineffective.). 30 g 0 Past Week at Unknown time  . losartan (COZAAR) 25 MG tablet Take 25 mg by mouth daily.   11/28/2018 at Unknown time  . omeprazole (PRILOSEC) 40 MG capsule Take 40 mg by mouth daily.   11/28/2018 at Unknown time  . oxyCODONE-acetaminophen (PERCOCET/ROXICET) 5-325 MG tablet Take 1 tablet by mouth every 4 (four) hours as needed for severe pain. 15 tablet 0 Past Week at Unknown time  . PARoxetine (PAXIL) 20 MG tablet Take 1 daily. This is to prevent panic attacks (Patient taking differently: Take 20 mg by mouth daily. This is to prevent panic attacks) 30 tablet 12 11/28/2018 at Unknown time  . torsemide (DEMADEX) 100 MG  tablet Take 100 mg by mouth 2 (two) times daily.    11/28/2018 at Unknown time  . [DISCONTINUED] ALPRAZolam (XANAX) 1 MG tablet Take 0.5 tablets (0.5 mg total) by mouth 2 (two) times daily as needed for anxiety (nerves). (Patient taking differently: Take 0.5 mg by mouth 4 (four) times daily. ) 30 tablet 0 11/28/2018 at Unknown time  . oxyCODONE-acetaminophen (PERCOCET) 5-325 MG tablet Take 1 tablet by mouth every 4 (four) hours as needed for severe pain. (Patient not taking: Reported on 11/29/2018) 20 tablet 0 Not Taking at Unknown time   Scheduled: . atorvastatin  20 mg Oral Daily  . calcium carbonate  2 tablet Oral With snacks  . calcium carbonate  2 tablet Oral With snacks  . calcium carbonate  4 tablet Oral TID WC  . Chlorhexidine Gluconate Cloth  6 each Topical Q0600  . Chlorhexidine Gluconate Cloth  6 each Topical Q0600  . cinacalcet  30 mg Oral Q breakfast  . doxercalciferol  1 mcg Intravenous Q M,W,F-HD  . epoetin alfa  10,000 Units Intravenous Once per day on Mon Wed Fri  . insulin aspart  0-9 Units Subcutaneous TID WC  . insulin glargine  12 Units Subcutaneous BH-q7a  . loratadine  10 mg Oral Daily  . losartan  25 mg Oral QHS  . multivitamin  1 tablet Oral QHS  . pantoprazole  40 mg Oral Daily  . sodium phosphate  1 enema Rectal Once   Continuous: . sodium chloride    . sodium chloride     GYB:WLSLHT chloride, sodium chloride, acetaminophen **OR** acetaminophen, albuterol, alteplase, alum & mag hydroxide-simeth, bisacodyl, bisacodyl, diphenhydrAMINE, heparin, heparin, HYDROcodone-acetaminophen, HYDROmorphone, ondansetron **OR** ondansetron (ZOFRAN) IV, pentafluoroprop-tetrafluoroeth, polyethylene glycol  Assesment: She was admitted with metabolic encephalopathy likely related to uremia from missing dialysis.  She has had dialysis and she is better.  She had recent surgery for carpal tunnel syndrome and she has a wrap on her arm.  She fell trying to get to dialysis last week and  she has a trimalleolar fracture of her ankle that will need surgical repair.  She has end-stage renal disease on dialysis and will need dialysis tomorrow  She has hypertension which is fairly well controlled and diabetes and she is on a sliding scale  She has anxiety and depression and that has been exacerbated by her hospitalization Active Problems:   Morbid obesity ([place])   RESTLESS LEG SYNDROME   Essential hypertension   Sleep apnea   Type 2 diabetes mellitus treated with insulin (H[facility])   ESRD (end stage renal disease) on dialysis ([place])   History of enucleation of left eyeball   Metabolic encephalopathy    Plan: Continue treatments.  See if we can get her up in a chair.    LOS: 5 days   Martha [name] 12/05/2018, 9:43 AM

## 2018-12-05 NOTE — Progress Notes (Signed)
Patient attempted to get into the chair with staff help. Patient states it hurts to bad and she is unable to bear weight on either of her legs. Patient declines to get into chair and request pain medication. Medication administered per order. Will reassess and try again to get into chair later.

## 2018-12-06 LAB — CBC
HCT: 25.6 % — ABNORMAL LOW (ref 36.0–46.0)
Hemoglobin: 7.9 g/dL — ABNORMAL LOW (ref 12.0–15.0)
MCH: 28 pg (ref 26.0–34.0)
MCHC: 30.9 g/dL (ref 30.0–36.0)
MCV: 90.8 fL (ref 80.0–100.0)
Platelets: 171 10*3/uL (ref 150–400)
RBC: 2.82 MIL/uL — ABNORMAL LOW (ref 3.87–5.11)
RDW: 16.8 % — ABNORMAL HIGH (ref 11.5–15.5)
WBC: 7.1 10*3/uL (ref 4.0–10.5)
nRBC: 0 % (ref 0.0–0.2)

## 2018-12-06 LAB — RENAL FUNCTION PANEL
Albumin: 3.4 g/dL — ABNORMAL LOW (ref 3.5–5.0)
Anion gap: 15 (ref 5–15)
BUN: 79 mg/dL — ABNORMAL HIGH (ref 6–20)
CO2: 25 mmol/L (ref 22–32)
Calcium: 9 mg/dL (ref 8.9–10.3)
Chloride: 96 mmol/L — ABNORMAL LOW (ref 98–111)
Creatinine, Ser: 10.56 mg/dL — ABNORMAL HIGH (ref 0.44–1.00)
GFR calc Af Amer: 4 mL/min — ABNORMAL LOW (ref >60)
GFR calc non Af Amer: 4 mL/min — ABNORMAL LOW (ref >60)
Glucose, Bld: 145 mg/dL — ABNORMAL HIGH (ref 70–99)
Phosphorus: 8.3 mg/dL — ABNORMAL HIGH (ref 2.5–4.6)
Potassium: 3.8 mmol/L (ref 3.5–5.1)
Sodium: 136 mmol/L (ref 135–145)

## 2018-12-06 LAB — GLUCOSE, CAPILLARY
Glucose-Capillary: 114 mg/dL — ABNORMAL HIGH (ref 70–99)
Glucose-Capillary: 171 mg/dL — ABNORMAL HIGH (ref 70–99)
Glucose-Capillary: 116 mg/dL — ABNORMAL HIGH (ref 70–99)
Glucose-Capillary: 163 mg/dL — ABNORMAL HIGH (ref 70–99)

## 2018-12-06 MED ORDER — HEPARIN SODIUM (PORCINE) 1000 UNIT/ML DIALYSIS
20.0000 [IU]/kg | INTRAMUSCULAR | Status: DC | PRN
  Administered 2018-12-08: 3100 [IU] via INTRAVENOUS_CENTRAL
  Filled 2018-12-06: qty 4

## 2018-12-06 MED ORDER — CHLORHEXIDINE GLUCONATE CLOTH 2 % EX PADS
6.0000 | MEDICATED_PAD | Freq: Every day | CUTANEOUS | Status: DC
  Administered 2018-12-08: 6 via TOPICAL

## 2018-12-06 MED ORDER — DOXERCALCIFEROL 4 MCG/2ML IV SOLN
1.0000 ug | INTRAVENOUS | Status: DC
  Administered 2018-12-06 – 2018-12-08 (×2): 1 ug via INTRAVENOUS
  Filled 2018-12-06 (×2): qty 2

## 2018-12-06 NOTE — Clinical Social Work Note (Addendum)
Spoke to [place] at [facility] who states they are awaiting documents from [facility] worker.  Worker was out Friday, but Jackelyn Poling spoke to her this AM, and was told documents would be available by noon.  Jackelyn Poling is optimistic that admission could be completed today. Followed up with Jackelyn Poling who stated she had gotten information from [facility], but is now waiting to hear back from corporate.  In the meantime, she also stated, that besides the patient needing to make a 30 day commitment to facility and forfeiting May check, she would need to go into quarantine for 21 days, and she does not think she has an isolation bed until Thursday.  Thanked her for the update, and told her I would be checking on some other places as a [facility] declined patient.  Resent information to Tammy at  [facility] to review.  [phone] Tammy left msg stating she thought patient was going to [facility]. Furthermore, they wouold not be able to transport pt to [facility] for dialysis.  Perhaps they would consider if chair could be identified in [place].

## 2018-12-06 NOTE — Procedures (Signed)
    HEMODIALYSIS TREATMENT NOTE:  4 hour low-heparin dialysis session completed via right chest wall tunneled catheter. Exit site unremarkable. Goal nearly met: 1.8 liters removed.  UF rate was serially decreased, then completely interrupted x 20 minutes for SBP<100.  All blood was returned.   [place], RN

## 2018-12-06 NOTE — Progress Notes (Signed)
[facility] NEPHROLOGY PROGRESS NOTE  Assessment/ Plan: Pt is a 53 y.o. yo female with obesity, HTN, DM, OSA, bipolar, ESRD on HD at [facility], carpal tunnel surgery, fall with right ankle fracture, admitted with encephalopathy and missing dialysis.  Dialysis prescription: [facility], MWF edw- 157 kg, 2K/2.5Ca, time 4.5 hrs, bfr 400, dfr 800, heparin 1000 units  # Uremic encephalopathy: due to non-compliance with HD. Mental status improved. # ESRD MWF at [facility]: last HD on 5/8, net UF 3.8 L, required albumin for intra-dialytic hypotension. HD today. 4 hr, 2K/2.5ca, tight heparin and goal UF 3-4 L. Lab ordered.  Please avoid phosphate containing enema and aluminium containing antacid, both discontinued.  # Anemia of CKD: iron sat low, received IV Feraheme on 5/8. Continue Epogen. Monitor CBC. # Secondary hyperparathyroidism: PTH 27, phos high. Discontinue sensipar. On tums and hectorol. F/u lab, may need to add phos binders. # HTN/volume: BP acceptable. Continue current anti-hypertensive. HD today.  # Right ankle fracture: currently has splint. Plan for surgery noted.  # Disposition: likely to SNF.  Subjective:  Seen and examined. Last HD on Friday. Denies N/V/D. No chest pain, SOB. Reports ankle pain which is stable. No new event. Objective Vital signs in last 24 hours: Vitals:   12/05/18 1358 12/05/18 2205 12/06/18 0015 12/06/18 0558  BP: (!) 154/80 (!) 144/68  139/65  Pulse: 73 77 74 76  Resp: 18 18 18 18   Temp:  98.4 F (36.9 C)  97.7 F (36.5 C)  TempSrc:  Oral  Oral  SpO2: 95% 97% 96% 98%  Weight:    (!) 156.6 kg  Height:       Weight change:   Intake/Output Summary (Last 24 hours) at 12/06/2018 0836 Last data filed at 12/05/2018 1700 Gross per 24 hour  Intake 240 ml  Output -  Net 240 ml       Labs: Basic Metabolic Panel: Recent Labs  Lab 12/01/18 1005 12/02/18 0550 12/03/18 1213  NA 140 138 137  K 4.4 4.5 4.6  CL 97* 97* 93*  CO2 26 28  28   GLUCOSE 219* 190* 204*  BUN 78* 52* 71*  CREATININE 9.48* 7.09* 9.21*  CALCIUM 9.5 9.3 9.2  PHOS 8.8* 8.2* 9.5*   Liver Function Tests: Recent Labs  Lab 11/29/18 1936 12/01/18 1005 12/02/18 0550 12/03/18 1213  AST 14*  --   --   --   ALT 5  --   --   --   ALKPHOS 93  --   --   --   BILITOT 0.4  --   --   --   PROT 7.4  --   --   --   ALBUMIN 3.3* 2.8* 2.9* 3.2*   No results for input(s): LIPASE, AMYLASE in the last 168 hours. No results for input(s): AMMONIA in the last 168 hours. CBC: Recent Labs  Lab 11/29/18 1936 12/01/18 1005 12/03/18 1213  WBC 6.3 6.0 7.2  NEUTROABS 5.1  --   --   HGB 8.5* 8.0* 8.3*  HCT 28.9* 25.8* 27.1*  MCV 95.4 92.1 93.4  PLT 144* 137* 168   Cardiac Enzymes: No results for input(s): CKTOTAL, CKMB, CKMBINDEX, TROPONINI in the last 168 hours. CBG: Recent Labs  Lab 12/05/18 0747 12/05/18 1140 12/05/18 1637 12/05/18 2206 12/06/18 0726  GLUCAP 109* 110* 109* 134* 114*    Iron Studies: No results for input(s): IRON, TIBC, TRANSFERRIN, FERRITIN in the last 72 hours. Studies/Results: No results found.  Medications: Infusions: .  sodium chloride    . sodium chloride      Scheduled Medications: . atorvastatin  20 mg Oral Daily  . calcium carbonate  2 tablet Oral With snacks  . calcium carbonate  2 tablet Oral With snacks  . calcium carbonate  4 tablet Oral TID WC  . Chlorhexidine Gluconate Cloth  6 each Topical Q0600  . Chlorhexidine Gluconate Cloth  6 each Topical Q0600  . Chlorhexidine Gluconate Cloth  6 each Topical Q0600  . cinacalcet  30 mg Oral Q breakfast  . doxercalciferol  1 mcg Intravenous Q M,W,F-HD  . epoetin alfa  10,000 Units Intravenous Once per day on Mon Wed Fri  . insulin aspart  0-9 Units Subcutaneous TID WC  . insulin glargine  12 Units Subcutaneous BH-q7a  . loratadine  10 mg Oral Daily  . losartan  25 mg Oral QHS  . multivitamin  1 tablet Oral QHS  . pantoprazole  40 mg Oral Daily  . sodium phosphate   1 enema Rectal Once    have reviewed scheduled and prn medications.  Physical Exam: General:NAD, comfortable Heart:RRR, s1s2 nl, no rubs Lungs:clear b/l, no cracjke Abdomen:soft, Non-tender, non-distended Extremities:No edema, right ankle splint. Dialysis Access: [facility] catheter.  Jabria Loos Prasad Niyla Marone 12/06/2018,8:36 AM  LOS: 6 days

## 2018-12-06 NOTE — Progress Notes (Signed)
Subjective: She is overall about the same.  She is frustrated because she has not found a place for her rehab yet.  She has concerned that she may have a UTI.  Objective: Vital signs in last 24 hours: Temp:  [97.7 F (36.5 C)-98.4 F (36.9 C)] 97.7 F (36.5 C) (05/11 0558) Pulse Rate:  [73-77] 76 (05/11 0558) Resp:  [18] 18 (05/11 0558) BP: (139-154)/(65-80) 139/65 (05/11 0558) SpO2:  [95 %-98 %] 98 % (05/11 0558) Weight:  [156.6 kg] 156.6 kg (05/11 0558) Weight change:  Last BM Date: 12/03/18  Intake/Output from previous day: 05/10 0701 - 05/11 0700 In: 240 [P.O.:240] Out: -   PHYSICAL EXAM General appearance: alert, cooperative, mild distress and morbidly obese Resp: clear to auscultation bilaterally Cardio: regular rate and rhythm, S1, S2 normal, no murmur, click, rub or gallop GI: soft, non-tender; bowel sounds normal; no masses,  no organomegaly Extremities: She has a wrap on her left wrist and a brace on her right ankle  Lab Results:  Results for orders placed or performed during the hospital encounter of 11/29/18 (from the past 48 hour(s))  Glucose, capillary     Status: Abnormal   Collection Time: 12/04/18 11:54 AM  Result Value Ref Range   Glucose-Capillary 148 (H) 70 - 99 mg/dL  Glucose, capillary     Status: Abnormal   Collection Time: 12/04/18  4:42 PM  Result Value Ref Range   Glucose-Capillary 148 (H) 70 - 99 mg/dL  Glucose, capillary     Status: Abnormal   Collection Time: 12/04/18  9:16 PM  Result Value Ref Range   Glucose-Capillary 128 (H) 70 - 99 mg/dL   Comment 1 Notify RN    Comment 2 Document in Chart   Glucose, capillary     Status: Abnormal   Collection Time: 12/05/18  7:47 AM  Result Value Ref Range   Glucose-Capillary 109 (H) 70 - 99 mg/dL  Glucose, capillary     Status: Abnormal   Collection Time: 12/05/18 11:40 AM  Result Value Ref Range   Glucose-Capillary 110 (H) 70 - 99 mg/dL  Glucose, capillary     Status: Abnormal   Collection  Time: 12/05/18  4:37 PM  Result Value Ref Range   Glucose-Capillary 109 (H) 70 - 99 mg/dL   Comment 1 Notify RN    Comment 2 Document in Chart   Glucose, capillary     Status: Abnormal   Collection Time: 12/05/18 10:06 PM  Result Value Ref Range   Glucose-Capillary 134 (H) 70 - 99 mg/dL   Comment 1 Notify RN    Comment 2 Document in Chart   Glucose, capillary     Status: Abnormal   Collection Time: 12/06/18  7:26 AM  Result Value Ref Range   Glucose-Capillary 114 (H) 70 - 99 mg/dL    ABGS No results for input(s): PHART, PO2ART, TCO2, HCO3 in the last 72 hours.  Invalid input(s): PCO2 CULTURES Recent Results (from the past 240 hour(s))  MRSA PCR Screening     Status: None   Collection Time: 11/30/18  2:05 AM  Result Value Ref Range Status   MRSA by PCR NEGATIVE NEGATIVE Final    Comment:        The GeneXpert MRSA Assay (FDA approved for NASAL specimens only), is one component of a comprehensive MRSA colonization surveillance program. It is not intended to diagnose MRSA infection nor to guide or monitor treatment for MRSA infections. Performed at [facility], [address]  [place], [place] [address]   Novel Coronavirus, NAA (hospital order; send-out to ref lab)     Status: None   Collection Time: 12/02/18  8:14 AM  Result Value Ref Range Status   SARS-CoV-2, NAA NOT DETECTED NOT DETECTED Final    Comment: (NOTE) This test was developed and its performance characteristics determined by [employer]. This test has not been FDA cleared or approved. This test has been authorized by FDA under an Emergency Use Authorization (EUA). This test is only authorized for the duration of time the declaration that circumstances exist justifying the authorization of the emergency use of in vitro diagnostic tests for detection of SARS-CoV-2 virus and/or diagnosis of COVID-19 infection under section 564(b)(1) of the Act, 21 U.S.C. 614ERX-5(Q)(0), unless the authorization is  terminated or revoked sooner. When diagnostic testing is negative, the possibility of a false negative result should be considered in the context of a patient's recent exposures and the presence of clinical signs and symptoms consistent with COVID-19. An individual without symptoms of COVID-19 and who is not shedding SARS-CoV-2 virus would expect to have a negative (not detected) result in this assay. Performed  At: [facility] [place], [place] [id number] Rush Farmer MD DT:[id number]    [place]  Final    Comment: Performed at [facility], [address], [place], [place] [address]   Studies/Results: No results found.  Medications:  Prior to Admission:  Medications Prior to Admission  Medication Sig Dispense Refill Last Dose  . albuterol (PROVENTIL) (2.5 MG/3ML) 0.083% nebulizer solution Take 2.5 mg by nebulization every 6 (six) hours as needed for wheezing or shortness of breath.    11/28/2018 at Unknown time  . atorvastatin (LIPITOR) 20 MG tablet Take 20 mg by mouth daily.    11/28/2018 at Unknown time  . busPIRone (BUSPAR) 7.5 MG tablet Take 7.5 mg by mouth 2 (two) times daily.   11/28/2018 at Unknown time  . calcium carbonate (TUMS EX) 750 MG chewable tablet Chew 2-4 tablets by mouth See admin instructions. Patient takes 4 tablets with meals and 2 tablet with snacks three times a day   11/28/2018 at Unknown time  . cetirizine (ZYRTEC) 10 MG chewable tablet Chew 10 mg by mouth daily.   11/28/2018 at Unknown time  . cinacalcet (SENSIPAR) 30 MG tablet Take 30 mg by mouth daily.   11/28/2018 at Unknown time  . colchicine 0.6 MG tablet Take 1 tablet (0.6 mg total) by mouth every Tuesday, Thursday, and Saturday at 6 PM. Take 1 tablet every other day   11/27/2018 at Unknown time  . gabapentin (NEURONTIN) 300 MG capsule Take 300 mg by mouth at bedtime.   Past Week at Unknown time  . HYDROcodone-acetaminophen (NORCO) 10-325 MG tablet Take 1 tablet by  mouth every 6 (six) hours as needed.   Past Week at Unknown time  . Insulin Glargine (LANTUS SOLOSTAR) 100 UNIT/ML Solostar Pen Inject 30 Units into the skin every morning. And pen needles 1/day 15 mL 11 11/28/2018 at Unknown time  . lidocaine-prilocaine (EMLA) cream Apply 1 application topically as needed (topical anesthesia for hemodialysis if Gebauers and Lidocaine injection are ineffective.). 30 g 0 Past Week at Unknown time  . losartan (COZAAR) 25 MG tablet Take 25 mg by mouth daily.   11/28/2018 at Unknown time  . omeprazole (PRILOSEC) 40 MG capsule Take 40 mg by mouth daily.   11/28/2018 at Unknown time  . oxyCODONE-acetaminophen (PERCOCET/ROXICET) 5-325 MG tablet Take 1 tablet  by mouth every 4 (four) hours as needed for severe pain. 15 tablet 0 Past Week at Unknown time  . PARoxetine (PAXIL) 20 MG tablet Take 1 daily. This is to prevent panic attacks (Patient taking differently: Take 20 mg by mouth daily. This is to prevent panic attacks) 30 tablet 12 11/28/2018 at Unknown time  . torsemide (DEMADEX) 100 MG tablet Take 100 mg by mouth 2 (two) times daily.    11/28/2018 at Unknown time  . [DISCONTINUED] ALPRAZolam (XANAX) 1 MG tablet Take 0.5 tablets (0.5 mg total) by mouth 2 (two) times daily as needed for anxiety (nerves). (Patient taking differently: Take 0.5 mg by mouth 4 (four) times daily. ) 30 tablet 0 11/28/2018 at Unknown time  . oxyCODONE-acetaminophen (PERCOCET) 5-325 MG tablet Take 1 tablet by mouth every 4 (four) hours as needed for severe pain. (Patient not taking: Reported on 11/29/2018) 20 tablet 0 Not Taking at Unknown time   Scheduled: . atorvastatin  20 mg Oral Daily  . calcium carbonate  2 tablet Oral With snacks  . calcium carbonate  2 tablet Oral With snacks  . calcium carbonate  4 tablet Oral TID WC  . Chlorhexidine Gluconate Cloth  6 each Topical Q0600  . Chlorhexidine Gluconate Cloth  6 each Topical Q0600  . Chlorhexidine Gluconate Cloth  6 each Topical Q0600  . doxercalciferol   1 mcg Intravenous Q M,W,F-HD  . epoetin alfa  10,000 Units Intravenous Once per day on Mon Wed Fri  . insulin aspart  0-9 Units Subcutaneous TID WC  . insulin glargine  12 Units Subcutaneous BH-q7a  . loratadine  10 mg Oral Daily  . losartan  25 mg Oral QHS  . multivitamin  1 tablet Oral QHS  . pantoprazole  40 mg Oral Daily   Continuous: . sodium chloride    . sodium chloride     ZOX:WRUEAV chloride, sodium chloride, acetaminophen **OR** acetaminophen, albuterol, alteplase, bisacodyl, bisacodyl, diphenhydrAMINE, heparin, heparin, HYDROcodone-acetaminophen, HYDROmorphone, ondansetron **OR** ondansetron (ZOFRAN) IV, pentafluoroprop-tetrafluoroeth, polyethylene glycol  Assesment: She was admitted with metabolic encephalopathy likely from missing dialysis.  She had missed dialysis for a variety of reasons including that she had had surgery for left carpal tunnel syndrome.  She missed dialysis because of that and then when she was getting ready for her next dialysis session she was off balance and fell and she suffered a trimalleolar fracture of her right ankle.  She initially was going to try to do dialysis from home but she has a lot of difficulty with transferring and was becoming more confused so she was admitted to the hospital.  She is due for dialysis today.  Plan is for her to go to a skilled care facility for rehab and she does need to go back to her orthopedist about her ankle fracture.  Her metabolic encephalopathy is better.  She has diabetes which is pretty well controlled  She has hypertension which is pretty well controlled  She has sleep apnea and uses her CPAP every night Active Problems:   Morbid obesity ([name])   RESTLESS LEG SYNDROME   Essential hypertension   Sleep apnea   Type 2 diabetes mellitus treated with insulin ([facility])   ESRD (end stage renal disease) on dialysis ([place])   History of enucleation of left eyeball   Metabolic encephalopathy    Plan: Likely for  dialysis today per nephrology.  Hopefully to a skilled care facility soon.  Will check urine since she is concerned that she may have  a UTI    LOS: 6 days   Martha [name] 12/06/2018, 9:03 AM

## 2018-12-07 LAB — GLUCOSE, CAPILLARY
Glucose-Capillary: 129 mg/dL — ABNORMAL HIGH (ref 70–99)
Glucose-Capillary: 144 mg/dL — ABNORMAL HIGH (ref 70–99)
Glucose-Capillary: 129 mg/dL — ABNORMAL HIGH (ref 70–99)
Glucose-Capillary: 140 mg/dL — ABNORMAL HIGH (ref 70–99)

## 2018-12-07 MED ORDER — FERRIC CITRATE 1 GM 210 MG(FE) PO TABS
630.0000 mg | ORAL_TABLET | Freq: Three times a day (TID) | ORAL | Status: DC
  Administered 2018-12-08 (×2): 630 mg via ORAL
  Filled 2018-12-07 (×10): qty 3

## 2018-12-07 MED ORDER — SODIUM CHLORIDE 0.9 % IV SOLN
510.0000 mg | Freq: Once | INTRAVENOUS | Status: DC
  Filled 2018-12-07 (×2): qty 17

## 2018-12-07 MED ORDER — CHLORHEXIDINE GLUCONATE CLOTH 2 % EX PADS
6.0000 | MEDICATED_PAD | Freq: Every day | CUTANEOUS | Status: DC
  Administered 2018-12-07 – 2018-12-08 (×2): 6 via TOPICAL

## 2018-12-07 NOTE — Progress Notes (Signed)
Subjective: She is about the same.  She had complained of symptoms of a urinary tract infection but now she tells me that she does not make any urine at all.  I try to get urinalysis and culture yesterday but she has not made any urine.  She also complains of constipation and I wonder if she is having generalized pelvic irritation perhaps from constipation.  Objective: Vital signs in last 24 hours: Temp:  [98 F (36.7 C)-98.6 F (37 C)] 98.1 F (36.7 C) (05/12 6812) Pulse Rate:  [60-75] 75 (05/12 0614) Resp:  [18-20] 18 (05/12 0614) BP: (90-150)/(46-77) 150/69 (05/12 0614) SpO2:  [96 %-98 %] 98 % (05/12 0614) Weight:  [156.8 kg] 156.8 kg (05/11 1350) Weight change: 0.2 kg Last BM Date: 12/03/18  Intake/Output from previous day: 05/11 0701 - 05/12 0700 In: 720 [P.O.:720] Out: 1804   PHYSICAL EXAM General appearance: alert, cooperative, no distress and morbidly obese Resp: clear to auscultation bilaterally Cardio: regular rate and rhythm, S1, S2 normal, no murmur, click, rub or gallop GI: soft, non-tender; bowel sounds normal; no masses,  no organomegaly Extremities: extremities normal, atraumatic, no cyanosis or edema  Lab Results:  Results for orders placed or performed during the hospital encounter of 11/29/18 (from the past 48 hour(s))  Glucose, capillary     Status: Abnormal   Collection Time: 12/05/18 11:40 AM  Result Value Ref Range   Glucose-Capillary 110 (H) 70 - 99 mg/dL  Glucose, capillary     Status: Abnormal   Collection Time: 12/05/18  4:37 PM  Result Value Ref Range   Glucose-Capillary 109 (H) 70 - 99 mg/dL   Comment 1 Notify RN    Comment 2 Document in Chart   Glucose, capillary     Status: Abnormal   Collection Time: 12/05/18 10:06 PM  Result Value Ref Range   Glucose-Capillary 134 (H) 70 - 99 mg/dL   Comment 1 Notify RN    Comment 2 Document in Chart   Glucose, capillary     Status: Abnormal   Collection Time: 12/06/18  7:26 AM  Result Value Ref Range    Glucose-Capillary 114 (H) 70 - 99 mg/dL  Glucose, capillary     Status: Abnormal   Collection Time: 12/06/18 11:09 AM  Result Value Ref Range   Glucose-Capillary 171 (H) 70 - 99 mg/dL  Renal function panel     Status: Abnormal   Collection Time: 12/06/18  2:25 PM  Result Value Ref Range   Sodium 136 135 - 145 mmol/L   Potassium 3.8 3.5 - 5.1 mmol/L   Chloride 96 (L) 98 - 111 mmol/L   CO2 25 22 - 32 mmol/L   Glucose, Bld 145 (H) 70 - 99 mg/dL   BUN 79 (H) 6 - 20 mg/dL   Creatinine, Ser 10.56 (H) 0.44 - 1.00 mg/dL   Calcium 9.0 8.9 - 10.3 mg/dL   Phosphorus 8.3 (H) 2.5 - 4.6 mg/dL   Albumin 3.4 (L) 3.5 - 5.0 g/dL   GFR calc non Af Amer 4 (L) >60 mL/min   GFR calc Af Amer 4 (L) >60 mL/min   Anion gap 15 5 - 15    Comment: Performed at [facility], [address] [place], [place] [address]  CBC     Status: Abnormal   Collection Time: 12/06/18  2:34 PM  Result Value Ref Range   WBC 7.1 4.0 - 10.5 K/uL   RBC 2.82 (L) 3.87 - 5.11 MIL/uL   Hemoglobin 7.9 (L)  12.0 - 15.0 g/dL   HCT 25.6 (L) 36.0 - 46.0 %   MCV 90.8 80.0 - 100.0 fL   MCH 28.0 26.0 - 34.0 pg   MCHC 30.9 30.0 - 36.0 g/dL   RDW 16.8 (H) 11.5 - 15.5 %   Platelets 171 150 - 400 K/uL   nRBC 0.0 0.0 - 0.2 %    Comment: Performed at [facility], [address], [place], [place] [address]  Glucose, capillary     Status: Abnormal   Collection Time: 12/06/18  4:22 PM  Result Value Ref Range   Glucose-Capillary 116 (H) 70 - 99 mg/dL  Glucose, capillary     Status: Abnormal   Collection Time: 12/06/18  9:04 PM  Result Value Ref Range   Glucose-Capillary 163 (H) 70 - 99 mg/dL   Comment 1 Notify RN    Comment 2 Document in Chart   Glucose, capillary     Status: Abnormal   Collection Time: 12/07/18  7:40 AM  Result Value Ref Range   Glucose-Capillary 129 (H) 70 - 99 mg/dL   Comment 1 Notify RN    Comment 2 Document in Chart     ABGS No results for input(s): PHART, PO2ART, TCO2, HCO3 in the last 72 hours.  Invalid  input(s): PCO2 CULTURES Recent Results (from the past 240 hour(s))  MRSA PCR Screening     Status: None   Collection Time: 11/30/18  2:05 AM  Result Value Ref Range Status   MRSA by PCR NEGATIVE NEGATIVE Final    Comment:        The GeneXpert MRSA Assay (FDA approved for NASAL specimens only), is one component of a comprehensive MRSA colonization surveillance program. It is not intended to diagnose MRSA infection nor to guide or monitor treatment for MRSA infections. Performed at [facility], [address] [place], [place] [address]   Novel Coronavirus, NAA (hospital order; send-out to ref lab)     Status: None   Collection Time: 12/02/18  8:14 AM  Result Value Ref Range Status   SARS-CoV-2, NAA NOT DETECTED NOT DETECTED Final    Comment: (NOTE) This test was developed and its performance characteristics determined by [employer]. This test has not been FDA cleared or approved. This test has been authorized by FDA under an Emergency Use Authorization (EUA). This test is only authorized for the duration of time the declaration that circumstances exist justifying the authorization of the emergency use of in vitro diagnostic tests for detection of SARS-CoV-2 virus and/or diagnosis of COVID-19 infection under section 564(b)(1) of the Act, 21 U.S.C. 366QHU-7(M)(5), unless the authorization is terminated or revoked sooner. When diagnostic testing is negative, the possibility of a false negative result should be considered in the context of a patient's recent exposures and the presence of clinical signs and symptoms consistent with COVID-19. An individual without symptoms of COVID-19 and who is not shedding SARS-CoV-2 virus would expect to have a negative (not detected) result in this assay. Performed  At: [facility] [place], [place] [id number] Rush Farmer MD KC:[id number]    [place]  Final    Comment: Performed at  [facility], [address] [place], [place] [address]   Studies/Results: No results found.  Medications:  Prior to Admission:  Medications Prior to Admission  Medication Sig Dispense Refill Last Dose  . albuterol (PROVENTIL) (2.5 MG/3ML) 0.083% nebulizer solution Take 2.5 mg by nebulization every 6 (six) hours as needed for wheezing or  shortness of breath.    11/28/2018 at Unknown time  . atorvastatin (LIPITOR) 20 MG tablet Take 20 mg by mouth daily.    11/28/2018 at Unknown time  . busPIRone (BUSPAR) 7.5 MG tablet Take 7.5 mg by mouth 2 (two) times daily.   11/28/2018 at Unknown time  . calcium carbonate (TUMS EX) 750 MG chewable tablet Chew 2-4 tablets by mouth See admin instructions. Patient takes 4 tablets with meals and 2 tablet with snacks three times a day   11/28/2018 at Unknown time  . cetirizine (ZYRTEC) 10 MG chewable tablet Chew 10 mg by mouth daily.   11/28/2018 at Unknown time  . cinacalcet (SENSIPAR) 30 MG tablet Take 30 mg by mouth daily.   11/28/2018 at Unknown time  . colchicine 0.6 MG tablet Take 1 tablet (0.6 mg total) by mouth every Tuesday, Thursday, and Saturday at 6 PM. Take 1 tablet every other day   11/27/2018 at Unknown time  . gabapentin (NEURONTIN) 300 MG capsule Take 300 mg by mouth at bedtime.   Past Week at Unknown time  . HYDROcodone-acetaminophen (NORCO) 10-325 MG tablet Take 1 tablet by mouth every 6 (six) hours as needed.   Past Week at Unknown time  . Insulin Glargine (LANTUS SOLOSTAR) 100 UNIT/ML Solostar Pen Inject 30 Units into the skin every morning. And pen needles 1/day 15 mL 11 11/28/2018 at Unknown time  . lidocaine-prilocaine (EMLA) cream Apply 1 application topically as needed (topical anesthesia for hemodialysis if Gebauers and Lidocaine injection are ineffective.). 30 g 0 Past Week at Unknown time  . losartan (COZAAR) 25 MG tablet Take 25 mg by mouth daily.   11/28/2018 at Unknown time  . omeprazole (PRILOSEC) 40 MG capsule Take 40 mg by mouth daily.   11/28/2018  at Unknown time  . oxyCODONE-acetaminophen (PERCOCET/ROXICET) 5-325 MG tablet Take 1 tablet by mouth every 4 (four) hours as needed for severe pain. 15 tablet 0 Past Week at Unknown time  . PARoxetine (PAXIL) 20 MG tablet Take 1 daily. This is to prevent panic attacks (Patient taking differently: Take 20 mg by mouth daily. This is to prevent panic attacks) 30 tablet 12 11/28/2018 at Unknown time  . torsemide (DEMADEX) 100 MG tablet Take 100 mg by mouth 2 (two) times daily.    11/28/2018 at Unknown time  . [DISCONTINUED] ALPRAZolam (XANAX) 1 MG tablet Take 0.5 tablets (0.5 mg total) by mouth 2 (two) times daily as needed for anxiety (nerves). (Patient taking differently: Take 0.5 mg by mouth 4 (four) times daily. ) 30 tablet 0 11/28/2018 at Unknown time  . oxyCODONE-acetaminophen (PERCOCET) 5-325 MG tablet Take 1 tablet by mouth every 4 (four) hours as needed for severe pain. (Patient not taking: Reported on 11/29/2018) 20 tablet 0 Not Taking at Unknown time   Scheduled: . atorvastatin  20 mg Oral Daily  . calcium carbonate  2 tablet Oral With snacks  . calcium carbonate  2 tablet Oral With snacks  . calcium carbonate  4 tablet Oral TID WC  . Chlorhexidine Gluconate Cloth  6 each Topical Q0600  . Chlorhexidine Gluconate Cloth  6 each Topical Q0600  . Chlorhexidine Gluconate Cloth  6 each Topical Q0600  . doxercalciferol  1 mcg Intravenous Q M,W,F-HD  . epoetin alfa  10,000 Units Intravenous Once per day on Mon Wed Fri  . insulin aspart  0-9 Units Subcutaneous TID WC  . insulin glargine  12 Units Subcutaneous BH-q7a  . loratadine  10 mg Oral Daily  .  losartan  25 mg Oral QHS  . multivitamin  1 tablet Oral QHS  . pantoprazole  40 mg Oral Daily   Continuous: . sodium chloride    . sodium chloride     QAS:TMHDQQ chloride, sodium chloride, acetaminophen **OR** acetaminophen, albuterol, alteplase, bisacodyl, bisacodyl, diphenhydrAMINE, heparin, heparin, heparin, HYDROcodone-acetaminophen, HYDROmorphone,  ondansetron **OR** ondansetron (ZOFRAN) IV, pentafluoroprop-tetrafluoroeth, polyethylene glycol  Assesment: She was admitted with altered mental status is likely from missing dialysis.  She is doing better since she has been on dialysis.  She has a trimalleolar fracture of her ankle and she is going to need placement because she does not transfer well.  We are awaiting bed offer.  She has end-stage renal disease on dialysis  She has constipation and I have ordered an enema  She has what she thinks is a urinary tract infection but she is not making any urine so I think that is unlikely  She has morbid obesity which is unchanged  She has diabetes which is on sliding scale and is doing well  She has chronic pain and acute pain from her ankle stable Active Problems:   Morbid obesity ([place])   RESTLESS LEG SYNDROME   Essential hypertension   Sleep apnea   Type 2 diabetes mellitus treated with insulin (HCC)   ESRD (end stage renal disease) on dialysis ([place])   History of enucleation of left eyeball   Metabolic encephalopathy    Plan: She can have an enema today if she will agree to do that.  Transfer to skilled care facility when beds available    LOS: 7 days   Martha [name] 12/07/2018, 9:05 AM

## 2018-12-07 NOTE — Progress Notes (Signed)
[facility] NEPHROLOGY PROGRESS NOTE  Assessment/ Plan: Pt is a 53 y.o. yo female with obesity, HTN, DM, OSA, bipolar, ESRD on HD at [facility], carpal tunnel surgery, fall with right ankle fracture, admitted with encephalopathy and missing dialysis.  Dialysis prescription: [facility], MWF edw- 157 kg, 2K/2.5Ca, time 4.5 hrs, bfr 400, dfr 800, heparin 1000 units  # Uremic encephalopathy: due to non-compliance with HD. Mental status improved. # ESRD MWF at [facility]: Had dialysis yesterday with net 1.8 L removed, had intradialytic hypotension likely due to pain medication.  Next treatment tomorrow.  Please avoid phosphate containing enema and aluminium containing antacid, both discontinued.  # Anemia of CKD: iron sat low, received IV Feraheme on 5/8.  Dose tomorrow.  Continue Epogen. Monitor CBC. # Secondary hyperparathyroidism: PTH 27, phos high. Discontinued sensipar. On tums and hectorol.  Start [place]. # HTN/volume: BP acceptable. Continue current anti-hypertensive. HD today.  # Right ankle fracture: currently has splint. Plan for surgery noted.  # Disposition: likely to SNF, awaiting bed. D/w Dr. Luan Pulling  Subjective:  Seen and examined. No new event. Denies N/V/D. No chest pain, SOB. Reports ankle pain which is stable.  Objective Vital signs in last 24 hours: Vitals:   12/06/18 1745 12/06/18 1800 12/06/18 2108 12/07/18 0614  BP: (!) 110/56 124/64 (!) 145/68 (!) 150/69  Pulse: 60 64 75 75  Resp:  18 20 18   Temp:  98 F (36.7 C) 98.6 F (37 C) 98.1 F (36.7 C)  TempSrc:  Oral Oral Oral  SpO2:   96% 98%  Weight:      Height:       Weight change: 0.2 kg  Intake/Output Summary (Last 24 hours) at 12/07/2018 0923 Last data filed at 12/06/2018 2100 Gross per 24 hour  Intake 480 ml  Output 1804 ml  Net -1324 ml       Labs: Basic Metabolic Panel: Recent Labs  Lab 12/02/18 0550 12/03/18 1213 12/06/18 1425  NA 138 137 136  K 4.5 4.6 3.8  CL 97* 93* 96*   CO2 28 28 25   GLUCOSE 190* 204* 145*  BUN 52* 71* 79*  CREATININE 7.09* 9.21* 10.56*  CALCIUM 9.3 9.2 9.0  PHOS 8.2* 9.5* 8.3*   Liver Function Tests: Recent Labs  Lab 12/02/18 0550 12/03/18 1213 12/06/18 1425  ALBUMIN 2.9* 3.2* 3.4*   No results for input(s): LIPASE, AMYLASE in the last 168 hours. No results for input(s): AMMONIA in the last 168 hours. CBC: Recent Labs  Lab 12/01/18 1005 12/03/18 1213 12/06/18 1434  WBC 6.0 7.2 7.1  HGB 8.0* 8.3* 7.9*  HCT 25.8* 27.1* 25.6*  MCV 92.1 93.4 90.8  PLT 137* 168 171   Cardiac Enzymes: No results for input(s): CKTOTAL, CKMB, CKMBINDEX, TROPONINI in the last 168 hours. CBG: Recent Labs  Lab 12/06/18 0726 12/06/18 1109 12/06/18 1622 12/06/18 2104 12/07/18 0740  GLUCAP 114* 171* 116* 163* 129*    Iron Studies: No results for input(s): IRON, TIBC, TRANSFERRIN, FERRITIN in the last 72 hours. Studies/Results: No results found.  Medications: Infusions: . sodium chloride    . sodium chloride      Scheduled Medications: . atorvastatin  20 mg Oral Daily  . calcium carbonate  2 tablet Oral With snacks  . calcium carbonate  2 tablet Oral With snacks  . calcium carbonate  4 tablet Oral TID WC  . Chlorhexidine Gluconate Cloth  6 each Topical Q0600  . Chlorhexidine Gluconate Cloth  6 each Topical Q0600  .  Chlorhexidine Gluconate Cloth  6 each Topical Q0600  . doxercalciferol  1 mcg Intravenous Q M,W,F-HD  . epoetin alfa  10,000 Units Intravenous Once per day on Mon Wed Fri  . insulin aspart  0-9 Units Subcutaneous TID WC  . insulin glargine  12 Units Subcutaneous BH-q7a  . loratadine  10 mg Oral Daily  . losartan  25 mg Oral QHS  . multivitamin  1 tablet Oral QHS  . pantoprazole  40 mg Oral Daily    have reviewed scheduled and prn medications.  Physical Exam: General:NAD, comfortable Heart:RRR, s1s2 nl, no rubs Lungs:clear b/l, no cracjke Abdomen:soft, Non-tender, non-distended Extremities:No edema, right  ankle splint. Dialysis Access: [facility] catheter.   Prasad  12/07/2018,9:23 AM  LOS: 7 days

## 2018-12-07 NOTE — TOC Progression Note (Signed)
Transition of Care [facility]) - Progression Note    Patient Details  Name: Martha [name] MRN: [id number] Date of Birth: 03-31-1966  Transition of [facility] CM/SW Contact  Roda Shutters Margretta Sidle, RN Phone Number: 12/07/2018, 4:23 PM  Clinical Narrative:   Per Jackelyn Poling at [facility], they have received approval from corporate for admission tomorrow morning.     Expected Discharge Plan: Skilled Nursing Facility Barriers to Discharge: No SNF bed  Expected Discharge Plan and Services Expected Discharge Plan: [place] Choice: [place] arrangements for the past 2 months: Single Family Home Expected Discharge Date: 12/04/18

## 2018-12-08 LAB — GLUCOSE, CAPILLARY
Glucose-Capillary: 134 mg/dL — ABNORMAL HIGH (ref 70–99)
Glucose-Capillary: 99 mg/dL (ref 70–99)
Glucose-Capillary: 169 mg/dL — ABNORMAL HIGH (ref 70–99)

## 2018-12-08 NOTE — Progress Notes (Signed)
[facility] PROGRESS NOTE  Assessment/ Plan: Pt is a 53 y.o. yo female with obesity, HTN, DM, OSA, bipolar, ESRD on HD at [facility], carpal tunnel surgery, fall with right ankle fracture, admitted with encephalopathy and missing dialysis.  Dialysis prescription: [facility], MWF edw- 157 kg, 2K/2.5Ca, time 4.5 hrs, bfr 400, dfr 800, heparin 1000 units  # Uremic encephalopathy: due to non-compliance with HD. Mental status improved. # ESRD MWF at [facility]: Plan for HD today. She had intradialytic hypotension in last treatment likely due to pain medication. Midodrine as needed.  Please avoid phosphate containing enema and aluminium containing antacid, both discontinued.  # Anemia of CKD: iron sat low, received IV Feraheme on 5/8 and another dose today. Continue Epogen. Monitor CBC. # Secondary hyperparathyroidism: PTH 27, phos high. Discontinued sensipar. On tums and hectorol.  Started [place]. # HTN/volume: BP acceptable. Continue current anti-hypertensive. HD today.  # Right ankle fracture: currently has splint. Plan for surgery noted.  # Disposition: likely to SNF, awaiting bed.  Subjective:  Seen and examined. Denies N/V/D. No chest pain, SOB. Reports ankle pain which is stable. No new event. Objective Vital signs in last 24 hours: Vitals:   12/07/18 1416 12/07/18 1925 12/07/18 2136 12/08/18 0521  BP: (!) 124/54  (!) 155/75 131/71  Pulse: 75  71 72  Resp: 18   12  Temp: 98 F (36.7 C)  98.3 F (36.8 C) 98.2 F (36.8 C)  TempSrc: Oral  Oral Oral  SpO2: 98% 95% 97%   Weight:    (!) 157.6 kg  Height:       Weight change: 0.8 kg No intake or output data in the 24 hours ending 12/08/18 0834     Labs: Basic Metabolic Panel: Recent Labs  Lab 12/02/18 0550 12/03/18 1213 12/06/18 1425  NA 138 137 136  K 4.5 4.6 3.8  CL 97* 93* 96*  CO2 28 28 25   GLUCOSE 190* 204* 145*  BUN 52* 71* 79*  CREATININE 7.09* 9.21* 10.56*  CALCIUM 9.3 9.2 9.0  PHOS  8.2* 9.5* 8.3*   Liver Function Tests: Recent Labs  Lab 12/02/18 0550 12/03/18 1213 12/06/18 1425  ALBUMIN 2.9* 3.2* 3.4*   No results for input(s): LIPASE, AMYLASE in the last 168 hours. No results for input(s): AMMONIA in the last 168 hours. CBC: Recent Labs  Lab 12/01/18 1005 12/03/18 1213 12/06/18 1434  WBC 6.0 7.2 7.1  HGB 8.0* 8.3* 7.9*  HCT 25.8* 27.1* 25.6*  MCV 92.1 93.4 90.8  PLT 137* 168 171   Cardiac Enzymes: No results for input(s): CKTOTAL, CKMB, CKMBINDEX, TROPONINI in the last 168 hours. CBG: Recent Labs  Lab 12/07/18 0740 12/07/18 1131 12/07/18 1650 12/07/18 2106 12/08/18 0737  GLUCAP 129* 144* 129* 140* 134*    Iron Studies: No results for input(s): IRON, TIBC, TRANSFERRIN, FERRITIN in the last 72 hours. Studies/Results: No results found.  Medications: Infusions: . sodium chloride    . sodium chloride    . ferumoxytol      Scheduled Medications: . atorvastatin  20 mg Oral Daily  . calcium carbonate  2 tablet Oral With snacks  . calcium carbonate  2 tablet Oral With snacks  . calcium carbonate  4 tablet Oral TID WC  . Chlorhexidine Gluconate Cloth  6 each Topical Q0600  . Chlorhexidine Gluconate Cloth  6 each Topical Q0600  . Chlorhexidine Gluconate Cloth  6 each Topical Q0600  . Chlorhexidine Gluconate Cloth  6 each Topical Q0600  .  doxercalciferol  1 mcg Intravenous Q M,W,F-HD  . epoetin alfa  10,000 Units Intravenous Once per day on Mon Wed Fri  . ferric citrate  630 mg Oral TID WC  . insulin aspart  0-9 Units Subcutaneous TID WC  . insulin glargine  12 Units Subcutaneous BH-q7a  . loratadine  10 mg Oral Daily  . losartan  25 mg Oral QHS  . multivitamin  1 tablet Oral QHS  . pantoprazole  40 mg Oral Daily    have reviewed scheduled and prn medications.  Physical Exam: General:NAD, lying on bed Heart:RRR, s1s2 nl, no rubs Lungs:clear b/l, no cracjke Abdomen:soft, Non-tender, non-distended Extremities:No edema, right ankle  splint. Dialysis Access: [facility] catheter. Neurology: alert, awake and following commands.   Prasad  12/08/2018,8:34 AM  LOS: 8 days

## 2018-12-08 NOTE — NC FL2 (Signed)
[place] LEVEL OF CARE SCREENING TOOL     IDENTIFICATION  Patient Name: Martha [name] Birthdate: [birth year] Sex: female Admission Date (Current Location): 11/29/2018  [place] and [place] Number:  [facility] [id number] [place] and Address:  [facility] [address] [place]      Provider Number: [id number]  Attending Physician Name and Address:  Sinda Du, MD  Relative Name and Phone Number:  Junie Bame [phone]    Current Level of Care: Hospital Recommended Level of Care: [place] Prior Approval Number:    Date Approved/Denied:   PASRR Number: [id number]  Discharge Plan: SNF    Current Diagnoses: Patient Active Problem List   Diagnosis Date Noted  . Metabolic encephalopathy 45/62/5638  . SIRS (systemic inflammatory response syndrome) ([place]) 06/23/2018  . UTI (urinary tract infection) 02/26/2018  . Volume overload 02/25/2018  . 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 Dialysis patient ([place]) 12/13/2017  . ESRD (end stage renal disease) on dialysis ([place])   . Chronic diarrhea 10/08/2017  . Renal failure (ARF), acute on chronic (HCC) 04/22/2017  . Anasarca associated with disorder of kidney 04/20/2017  . Protein-calorie malnutrition, severe 11/29/2016  . HTN (hypertension), malignant 11/20/2016  . Bipolar 1 disorder ([place]) 11/20/2016  . Noncompliance with medication regimen 11/20/2016  . Panic attack 11/20/2016  . Uncontrolled type 2 diabetes mellitus with hyperglycemia, without long-term current use of insulin ([place]) 10/27/2016  . Sensory disturbance 10/27/2016  . Left hemiparesis ([place]) 10/27/2016  . Cholelithiasis 04/26/2014  . Hyponatremia 04/26/2014  . Gallstones 04/06/2014  . Diabetic ulcer of left great toe ([place]) 10/09/2013  . Type 2 diabetes mellitus treated with insulin ([place]) 05/30/2013  . Hyperglycemia 05/30/2013  . Internal hemorrhoids with other complication 93/73/4287  .  Umbilical hernia without mention of obstruction or gangrene 04/15/2013  . Anemia 04/15/2013  . DM 05/13/2010  . Hyperlipidemia 05/13/2010  . Morbid obesity ([place]) 05/13/2010  . Depression with anxiety 05/13/2010  . RESTLESS LEG SYNDROME 05/13/2010  . Essential hypertension 05/13/2010  . GERD 05/13/2010  . RECTAL BLEEDING 05/13/2010  . Sleep apnea 05/13/2010  . Dysphagia, oropharyngeal phase 05/13/2010    Orientation RESPIRATION BLADDER Height & Weight     Self, Time, Situation, Place  O2(2lpm and CPAP HS) Continent(oliguric) Weight: (!) 157.7 kg Height:  5\' 8"  (172.7 cm)  BEHAVIORAL SYMPTOMS/MOOD NEUROLOGICAL BOWEL NUTRITION STATUS  (n/a) (N/A) Continent Diet(renal carb modified; fluid resctriction of 1269ml)  AMBULATORY STATUS COMMUNICATION OF NEEDS Skin   Total Care Verbally Surgical wounds(incision left wrist; old surgical wound on umbilical area thats open to air but partially healed)                       Personal Care Assistance Level of Assistance  Bathing, Feeding, Dressing Bathing Assistance: Maximum assistance Feeding assistance: Independent Dressing Assistance: Maximum assistance     Functional Limitations Info  Sight, Hearing, Speech Sight Info: Impaired Hearing Info: Adequate Speech Info: Adequate    SPECIAL CARE FACTORS FREQUENCY  PT (By licensed PT)     PT Frequency: 5 days/week or as able              Contractures Contractures Info: Not present    Additional Factors Info  Code Status, Allergies, Psychotropic, Insulin Sliding Scale Code Status Info: full Allergies Info: codeine; tape Psychotropic Info: xanax, buspar, paxil Insulin Sliding Scale Info: see DC summary       Current Medications (  12/08/2018):  This is the current hospital active medication list Current Facility-Administered Medications  Medication Dose Route Frequency Provider Last Rate Last Dose  . 0.9 %  sodium chloride infusion  100 mL Intravenous PRN Deterding, Jeneen Rinks, MD       . 0.9 %  sodium chloride infusion  100 mL Intravenous PRN Deterding, Jeneen Rinks, MD      . acetaminophen (TYLENOL) tablet 650 mg  650 mg Oral Q6H PRN Emokpae, Ejiroghene E, MD       Or  . acetaminophen (TYLENOL) suppository 650 mg  650 mg Rectal Q6H PRN Emokpae, Ejiroghene E, MD      . albuterol (PROVENTIL) (2.5 MG/3ML) 0.083% nebulizer solution 2.5 mg  2.5 mg Nebulization Q6H PRN Emokpae, Ejiroghene E, MD   2.5 mg at 12/01/18 1645  . alteplase (CATHFLO ACTIVASE) injection 2 mg  2 mg Intracatheter Once PRN Mauricia Area, MD      . atorvastatin (LIPITOR) tablet 20 mg  20 mg Oral Daily Emokpae, Ejiroghene E, MD   20 mg at 12/08/18 0841  . bisacodyl (DULCOLAX) EC tablet 10 mg  10 mg Oral Daily PRN Sinda Du, MD   10 mg at 12/02/18 1613  . bisacodyl (DULCOLAX) suppository 10 mg  10 mg Rectal Daily PRN Sinda Du, MD   10 mg at 12/03/18 1100  . calcium carbonate (TUMS - dosed in mg elemental calcium) chewable tablet 400 mg of elemental calcium  2 tablet Oral With snacks Emokpae, Ejiroghene E, MD   400 mg of elemental calcium at 12/07/18 2100  . calcium carbonate (TUMS - dosed in mg elemental calcium) chewable tablet 400 mg of elemental calcium  2 tablet Oral With snacks Sinda Du, MD   400 mg of elemental calcium at 12/07/18 2100  . calcium carbonate (TUMS - dosed in mg elemental calcium) chewable tablet 800 mg of elemental calcium  4 tablet Oral TID WC Sinda Du, MD   800 mg of elemental calcium at 12/08/18 1241  . Chlorhexidine Gluconate Cloth 2 % PADS 6 each  6 each Topical [id number] Mauricia Area, MD   6 each at 12/08/18 0645  . Chlorhexidine Gluconate Cloth 2 % PADS 6 each  6 each Topical [id number] Mauricia Area, MD   6 each at 12/08/18 [phone]  . Chlorhexidine Gluconate Cloth 2 % PADS 6 each  6 each Topical Q0600 Rosita Fire, MD   6 each at 12/08/18 [phone]  . Chlorhexidine Gluconate Cloth 2 % PADS 6 each  6 each Topical Q0600 Rosita Fire, MD   6 each at  12/08/18 [phone]  . diphenhydrAMINE (BENADRYL) capsule 25 mg  25 mg Oral Q6H PRN Sinda Du, MD   25 mg at 12/04/18 1156  . doxercalciferol (HECTOROL) injection 1 mcg  1 mcg Intravenous Q M,W,F-HD Rosita Fire, MD   1 mcg at 12/08/18 1242  . epoetin alfa (EPOGEN) injection 10,000 Units  10,000 Units Intravenous Once per day on Mon Wed Fri Coladonato, Joseph, MD   10,000 Units at 12/08/18 1243  . ferric citrate (AURYXIA) tablet 630 mg  630 mg Oral TID WC Rosita Fire, MD   630 mg at 12/08/18 1241  . ferumoxytol (FERAHEME) 510 mg in sodium chloride 0.9 % 100 mL IVPB  510 mg Intravenous Once Rosita Fire, MD      . heparin injection 3,000 Units  3,000 Units Dialysis PRN Deterding, Jeneen Rinks, MD      . heparin injection 3,100 Units  20 Units/kg Dialysis PRN  Rosita Fire, MD   3,100 Units at 12/08/18 1015  . heparin injection 3,300 Units  20 Units/kg Dialysis PRN Donato Heinz, MD   3,300 Units at 12/06/18 1350  . HYDROcodone-acetaminophen (NORCO/VICODIN) 5-325 MG per tablet 1 tablet  1 tablet Oral Q6H PRN Sinda Du, MD   1 tablet at 12/07/18 0311  . HYDROmorphone (DILAUDID) injection 0.5 mg  0.5 mg Intravenous Q4H PRN Sinda Du, MD   0.5 mg at 12/06/18 2141  . insulin aspart (novoLOG) injection 0-9 Units  0-9 Units Subcutaneous TID WC Emokpae, Ejiroghene E, MD   1 Units at 12/08/18 0842  . insulin glargine (LANTUS) injection 12 Units  12 Units Subcutaneous Kerrin Champagne, MD   12 Units at 12/08/18 [phone]  . loratadine (CLARITIN) tablet 10 mg  10 mg Oral Daily Emokpae, Ejiroghene E, MD   10 mg at 12/08/18 0841  . losartan (COZAAR) tablet 25 mg  25 mg Oral Nile Riggs, MD   25 mg at 12/07/18 2110  . multivitamin (RENA-VIT) tablet 1 tablet  1 tablet Oral Nile Riggs, MD   1 tablet at 12/07/18 2111  . ondansetron (ZOFRAN) tablet 4 mg  4 mg Oral Q6H PRN Emokpae, Ejiroghene E, MD   4 mg at 12/01/18 1933   Or  . ondansetron (ZOFRAN)  injection 4 mg  4 mg Intravenous Q6H PRN Emokpae, Ejiroghene E, MD   4 mg at 12/02/18 1822  . pantoprazole (PROTONIX) EC tablet 40 mg  40 mg Oral Daily Emokpae, Ejiroghene E, MD   40 mg at 12/08/18 0841  . pentafluoroprop-tetrafluoroeth (GEBAUERS) aerosol 1 application  1 application Topical PRN Deterding, Jeneen Rinks, MD      . polyethylene glycol (MIRALAX / GLYCOLAX) packet 17 g  17 g Oral Daily PRN Emokpae, Ejiroghene E, MD   17 g at 12/02/18 1006     Discharge Medications: Please see discharge summary for a list of discharge medications.  Relevant Imaging Results:  Relevant Lab Results:   Additional Information SSN [id number] ; ESRD on HD MWF at [facility] in [place]; Ht: 5'8" and Wt: 168KG; CPAP HS  Roda Shutters Margretta Sidle, RN

## 2018-12-08 NOTE — Discharge Instructions (Signed)
Dialysis Dialysis is a procedure that is done when the kidneys have stopped working properly (kidney failure). It may also be done earlier if it may help improve symptoms. During dialysis, wastes, salt, and extra water are removed from the blood, and the levels of certain minerals in the blood are maintained. Dialysis is done in sessions which are continued until the kidneys get better. If the kidneys cannot get better, such as in end-stage kidney disease, dialysis is continued for life or until you receive a new kidney from a donor (kidney transplant). There are two types of dialysis: hemodialysis and peritoneal dialysis. What is hemodialysis?        Hemodialysis is when a machine called a dialyzer is used to filter the blood. Before starting hemodialysis, you will have surgery to create a site where blood can be removed from the body and returned to the body (vascular access). There are three types of vascular accesses:  Arteriovenous fistula. This type of access is created when an artery and a vein (usually in the arm) are connected during surgery. The arteriovenous fistula usually takes 1-6 months to develop after surgery. It may last longer than the other types of vascular accesses and is less likely to become infected or cause blood clots.  Arteriovenous graft. This type of access is created when an artery and a vein in the arm are connected during surgery with a tube. An arteriovenous graft can usually be used within 2-3 weeks of surgery.  A venous catheter. To create this type of access, a thin tube (catheter) is placed in a large vein in your neck, chest, or groin. A venous catheter can be used right away. It is usually used as a temporary access when dialysis needs to begin immediately. During hemodialysis, blood leaves your body through your access site. It travels through a tube to the dialyzer, where it is filtered. The blood then returns to your body through another tube. Hemodialysis  is usually done at a hospital or dialysis center three times a week. Visits last about 3-5 hours. With special training, it may also be done at home with the help of another person. What is peritoneal dialysis? Peritoneal dialysis is when the thin lining of the abdomen (peritoneum) and a fluid called dialysate are used to filter the blood. Before starting peritoneal dialysis, you will have surgery to place a catheter in your abdomen. The catheter will be used to transfer dialysate to and from your abdomen. At the start of a session, your abdomen is filled with dialysate. During the session, wastes, salt, and extra water in the blood pass through the peritoneum and into the dialysate. The dialysate is drained from the body at the end of the session. The process of filling and draining the dialysate is called an exchange. Exchanges are repeated until you have used up all the dialysate for the day. You may do peritoneal dialysis at home or at almost any other location. It is done every day. You may need up to five exchanges a day. Each exchange takes about 30-40 minutes. The amount of time the dialysate is in your body between exchanges is called a dwell. The dwell usually lasts 1.5-3 hours and can vary with each person. You may choose to do exchanges at night while you sleep, using a machine called a cycler. Which type of dialysis should I choose? Both types of dialysis have advantages and disadvantages. Talk with your health care provider about which type of dialysis is best  for you. Your lifestyle, preferences, and medical condition should be considered. In some cases, only one type of dialysis can be chosen. Advantages of hemodialysis  It is done less often than peritoneal dialysis.  Someone else can do the dialysis for you.  If you go to a dialysis center: ? Your health care provider can recognize any problems you may be having. ? You can interact with others who are having dialysis. This can  provide you with emotional support. Disadvantages of hemodialysis  Hemodialysis may cause cramps and low blood pressure. It may leave you feeling tired on the days you have the treatment.  If you go to a dialysis center, you will need to make weekly appointments and work around the centers schedule.  You will need to take extra care when traveling. If you usually get treatment in a dialysis center, you will need to arrange to visit a dialysis center near your destination. If you are having treatments at home, you will need to take the dialyzer with you when traveling.  There are more eating restrictions than with peritoneal dialysis. Advantages of peritoneal dialysis  It is less likely than hemodialysis to cause cramps and low blood pressure.  There are fewer eating restrictions than with hemodialysis.  You may do exchanges on your own wherever you are, including when you travel. Disadvantages of peritoneal dialysis  It is done more often than hemodialysis.  Doing peritoneal dialysis requires you to have a good use (dexterity) of your hands. You must also be able to lift bags.  You must learn how to make your equipment free of germs (sterilization techniques). You will need to use these techniques every day to prevent infection. What changes will I need to make to my diet during dialysis? Both types of dialysis require you to make some changes to your diet. For example, you will need to limit your intake of foods that contain a lot of phosphorus and potassium. You will also need to limit your fluid intake. A diet and nutrition specialist (dietitian) can help you make a meal plan that can help improve your dialysis and your health. What should I expect when starting dialysis? Adjusting to the dialysis treatment, schedule, and diet can take some time. You may need to stop working and may not be able to do some of your normal activities. You may feel anxious or depressed when starting  dialysis. Over time, many people feel better overall because of dialysis. You may be able to return to work after making some changes, such as reducing work intensity. Where to find more information  [place]: www.kidney.org  American Association of Kidney Patients: BombTimer.gl  [employer]: www.kidneyfund.org Summary  During dialysis, wastes, salt, and extra water are removed from the blood, and the levels of certain minerals in the blood are maintained. There are two types of dialysis: hemodialysis and peritoneal dialysis.  Hemodialysis is when a machine called a dialyzer is used to filter the blood.  Hemodialysis is usually done by a health care provider at a hospital or dialysis center three times a week.  Peritoneal dialysis is when the peritoneum is used as a filter. You may do peritoneal dialysis at home or at almost any other location.  Both types of dialysis have advantages and disadvantages. Talk with your health care provider about which type of dialysis is best for you. This information is not intended to replace advice given to you by your health care provider. Make sure you  discuss any questions you have with your health care provider. Document Released: 10/04/2002 Document Revised: 09/09/2016 Document Reviewed: 09/09/2016 Elsevier Interactive Patient Education  2019 [employer].

## 2018-12-08 NOTE — [place] Notes (Signed)
Report called to [place] at this time

## 2018-12-08 NOTE — Progress Notes (Signed)
Subjective: She says she feels okay.  Her bowels have moved.  She is complaining of pain in her ankle.  No other new complaints  Objective: Vital signs in last 24 hours: Temp:  [98 F (36.7 C)-98.3 F (36.8 C)] 98.2 F (36.8 C) (05/13 0521) Pulse Rate:  [71-75] 72 (05/13 0521) Resp:  [12-18] 12 (05/13 0521) BP: (124-155)/(54-75) 131/71 (05/13 0521) SpO2:  [95 %-98 %] 97 % (05/12 2136) Weight:  [157.6 kg] 157.6 kg (05/13 0521) Weight change: 0.8 kg Last BM Date: 12/07/18  Intake/Output from previous day: No intake/output data recorded.  PHYSICAL EXAM General appearance: alert, cooperative, mild distress and morbidly obese Resp: clear to auscultation bilaterally Cardio: regular rate and rhythm, S1, S2 normal, no murmur, click, rub or gallop GI: soft, non-tender; bowel sounds normal; no masses,  no organomegaly Extremities: Her bulky wrap is been taken off of her left wrist and the area looks good.  She still has a splint on her right ankle and neurovascular is intact  Lab Results:  Results for orders placed or performed during the hospital encounter of 11/29/18 (from the past 48 hour(s))  Glucose, capillary     Status: Abnormal   Collection Time: 12/06/18 11:09 AM  Result Value Ref Range   Glucose-Capillary 171 (H) 70 - 99 mg/dL  Renal function panel     Status: Abnormal   Collection Time: 12/06/18  2:25 PM  Result Value Ref Range   Sodium 136 135 - 145 mmol/L   Potassium 3.8 3.5 - 5.1 mmol/L   Chloride 96 (L) 98 - 111 mmol/L   CO2 25 22 - 32 mmol/L   Glucose, Bld 145 (H) 70 - 99 mg/dL   BUN 79 (H) 6 - 20 mg/dL   Creatinine, Ser 10.56 (H) 0.44 - 1.00 mg/dL   Calcium 9.0 8.9 - 10.3 mg/dL   Phosphorus 8.3 (H) 2.5 - 4.6 mg/dL   Albumin 3.4 (L) 3.5 - 5.0 g/dL   GFR calc non Af Amer 4 (L) >60 mL/min   GFR calc Af Amer 4 (L) >60 mL/min   Anion gap 15 5 - 15    Comment: Performed at [facility], [address] [place], [place] [address]  CBC     Status: Abnormal    Collection Time: 12/06/18  2:34 PM  Result Value Ref Range   WBC 7.1 4.0 - 10.5 K/uL   RBC 2.82 (L) 3.87 - 5.11 MIL/uL   Hemoglobin 7.9 (L) 12.0 - 15.0 g/dL   HCT 25.6 (L) 36.0 - 46.0 %   MCV 90.8 80.0 - 100.0 fL   MCH 28.0 26.0 - 34.0 pg   MCHC 30.9 30.0 - 36.0 g/dL   RDW 16.8 (H) 11.5 - 15.5 %   Platelets 171 150 - 400 K/uL   nRBC 0.0 0.0 - 0.2 %    Comment: Performed at [facility], [address] [place], [place] [address]  Glucose, capillary     Status: Abnormal   Collection Time: 12/06/18  4:22 PM  Result Value Ref Range   Glucose-Capillary 116 (H) 70 - 99 mg/dL  Glucose, capillary     Status: Abnormal   Collection Time: 12/06/18  9:04 PM  Result Value Ref Range   Glucose-Capillary 163 (H) 70 - 99 mg/dL   Comment 1 Notify RN    Comment 2 Document in Chart   Glucose, capillary     Status: Abnormal   Collection Time: 12/07/18  7:40 AM  Result Value Ref Range  Glucose-Capillary 129 (H) 70 - 99 mg/dL   Comment 1 Notify RN    Comment 2 Document in Chart   Glucose, capillary     Status: Abnormal   Collection Time: 12/07/18 11:31 AM  Result Value Ref Range   Glucose-Capillary 144 (H) 70 - 99 mg/dL   Comment 1 Notify RN    Comment 2 Document in Chart   Glucose, capillary     Status: Abnormal   Collection Time: 12/07/18  4:50 PM  Result Value Ref Range   Glucose-Capillary 129 (H) 70 - 99 mg/dL   Comment 1 Notify RN    Comment 2 Document in Chart   Glucose, capillary     Status: Abnormal   Collection Time: 12/07/18  9:06 PM  Result Value Ref Range   Glucose-Capillary 140 (H) 70 - 99 mg/dL  Glucose, capillary     Status: Abnormal   Collection Time: 12/08/18  7:37 AM  Result Value Ref Range   Glucose-Capillary 134 (H) 70 - 99 mg/dL    ABGS No results for input(s): PHART, PO2ART, TCO2, HCO3 in the last 72 hours.  Invalid input(s): PCO2 CULTURES Recent Results (from the past 240 hour(s))  MRSA PCR Screening     Status: None   Collection Time: 11/30/18  2:05 AM   Result Value Ref Range Status   MRSA by PCR NEGATIVE NEGATIVE Final    Comment:        The GeneXpert MRSA Assay (FDA approved for NASAL specimens only), is one component of a comprehensive MRSA colonization surveillance program. It is not intended to diagnose MRSA infection nor to guide or monitor treatment for MRSA infections. Performed at [facility], [address], [place], [place] [address]   Novel Coronavirus, NAA (hospital order; send-out to ref lab)     Status: None   Collection Time: 12/02/18  8:14 AM  Result Value Ref Range Status   SARS-CoV-2, NAA NOT DETECTED NOT DETECTED Final    Comment: (NOTE) This test was developed and its performance characteristics determined by [employer]. This test has not been FDA cleared or approved. This test has been authorized by FDA under an Emergency Use Authorization (EUA). This test is only authorized for the duration of time the declaration that circumstances exist justifying the authorization of the emergency use of in vitro diagnostic tests for detection of SARS-CoV-2 virus and/or diagnosis of COVID-19 infection under section 564(b)(1) of the Act, 21 U.S.C. 008QPY-1(P)(5), unless the authorization is terminated or revoked sooner. When diagnostic testing is negative, the possibility of a false negative result should be considered in the context of a patient's recent exposures and the presence of clinical signs and symptoms consistent with COVID-19. An individual without symptoms of COVID-19 and who is not shedding SARS-CoV-2 virus would expect to have a negative (not detected) result in this assay. Performed  At: [facility] [place], [place] [id number] Rush Farmer MD PY:[id number]    [place]  Final    Comment: Performed at [facility], [address] [place], [place] [address]   Studies/Results: No results found.  Medications:  Prior to Admission:   Medications Prior to Admission  Medication Sig Dispense Refill Last Dose  . albuterol (PROVENTIL) (2.5 MG/3ML) 0.083% nebulizer solution Take 2.5 mg by nebulization every 6 (six) hours as needed for wheezing or shortness of breath.    11/28/2018 at Unknown time  . atorvastatin (LIPITOR) 20 MG tablet Take 20 mg by mouth daily.  11/28/2018 at Unknown time  . busPIRone (BUSPAR) 7.5 MG tablet Take 7.5 mg by mouth 2 (two) times daily.   11/28/2018 at Unknown time  . calcium carbonate (TUMS EX) 750 MG chewable tablet Chew 2-4 tablets by mouth See admin instructions. Patient takes 4 tablets with meals and 2 tablet with snacks three times a day   11/28/2018 at Unknown time  . cetirizine (ZYRTEC) 10 MG chewable tablet Chew 10 mg by mouth daily.   11/28/2018 at Unknown time  . cinacalcet (SENSIPAR) 30 MG tablet Take 30 mg by mouth daily.   11/28/2018 at Unknown time  . colchicine 0.6 MG tablet Take 1 tablet (0.6 mg total) by mouth every Tuesday, Thursday, and Saturday at 6 PM. Take 1 tablet every other day   11/27/2018 at Unknown time  . gabapentin (NEURONTIN) 300 MG capsule Take 300 mg by mouth at bedtime.   Past Week at Unknown time  . HYDROcodone-acetaminophen (NORCO) 10-325 MG tablet Take 1 tablet by mouth every 6 (six) hours as needed.   Past Week at Unknown time  . Insulin Glargine (LANTUS SOLOSTAR) 100 UNIT/ML Solostar Pen Inject 30 Units into the skin every morning. And pen needles 1/day 15 mL 11 11/28/2018 at Unknown time  . lidocaine-prilocaine (EMLA) cream Apply 1 application topically as needed (topical anesthesia for hemodialysis if Gebauers and Lidocaine injection are ineffective.). 30 g 0 Past Week at Unknown time  . losartan (COZAAR) 25 MG tablet Take 25 mg by mouth daily.   11/28/2018 at Unknown time  . omeprazole (PRILOSEC) 40 MG capsule Take 40 mg by mouth daily.   11/28/2018 at Unknown time  . oxyCODONE-acetaminophen (PERCOCET/ROXICET) 5-325 MG tablet Take 1 tablet by mouth every 4 (four) hours as needed  for severe pain. 15 tablet 0 Past Week at Unknown time  . PARoxetine (PAXIL) 20 MG tablet Take 1 daily. This is to prevent panic attacks (Patient taking differently: Take 20 mg by mouth daily. This is to prevent panic attacks) 30 tablet 12 11/28/2018 at Unknown time  . torsemide (DEMADEX) 100 MG tablet Take 100 mg by mouth 2 (two) times daily.    11/28/2018 at Unknown time  . [DISCONTINUED] ALPRAZolam (XANAX) 1 MG tablet Take 0.5 tablets (0.5 mg total) by mouth 2 (two) times daily as needed for anxiety (nerves). (Patient taking differently: Take 0.5 mg by mouth 4 (four) times daily. ) 30 tablet 0 11/28/2018 at Unknown time  . oxyCODONE-acetaminophen (PERCOCET) 5-325 MG tablet Take 1 tablet by mouth every 4 (four) hours as needed for severe pain. (Patient not taking: Reported on 11/29/2018) 20 tablet 0 Not Taking at Unknown time   Scheduled: . atorvastatin  20 mg Oral Daily  . calcium carbonate  2 tablet Oral With snacks  . calcium carbonate  2 tablet Oral With snacks  . calcium carbonate  4 tablet Oral TID WC  . Chlorhexidine Gluconate Cloth  6 each Topical Q0600  . Chlorhexidine Gluconate Cloth  6 each Topical Q0600  . Chlorhexidine Gluconate Cloth  6 each Topical Q0600  . Chlorhexidine Gluconate Cloth  6 each Topical Q0600  . doxercalciferol  1 mcg Intravenous Q M,W,F-HD  . epoetin alfa  10,000 Units Intravenous Once per day on Mon Wed Fri  . ferric citrate  630 mg Oral TID WC  . insulin aspart  0-9 Units Subcutaneous TID WC  . insulin glargine  12 Units Subcutaneous [id number]  . loratadine  10 mg Oral Daily  . losartan  25 mg Oral  QHS  . multivitamin  1 tablet Oral QHS  . pantoprazole  40 mg Oral Daily   Continuous: . sodium chloride    . sodium chloride    . ferumoxytol     GDJ:MEQAST chloride, sodium chloride, acetaminophen **OR** acetaminophen, albuterol, alteplase, bisacodyl, bisacodyl, diphenhydrAMINE, heparin, heparin, heparin, HYDROcodone-acetaminophen, HYDROmorphone, ondansetron **OR**  ondansetron (ZOFRAN) IV, pentafluoroprop-tetrafluoroeth, polyethylene glycol  Assesment: She came in with altered mental status presumably from uremia.  She had missed dialysis on several occasions.  She has been dialyzing here.  She is due for dialysis today.  She apparently has been approved for a bed at skilled care facility for later today.  She had carpal tunnel syndrome surgery and everything looks okay with that  He has a trimalleolar fracture of her ankle and Dr. Aline Brochure reviewed her chart and feels that she is more appropriate for treatment in [facility]  She has end-stage renal disease on dialysis and is due for dialysis today  She has chronic pain which is pretty well controlled  He has diabetes which is well controlled Active Problems:   Morbid obesity ([place])   RESTLESS LEG SYNDROME   Essential hypertension   Sleep apnea   Type 2 diabetes mellitus treated with insulin (HCC)   ESRD (end stage renal disease) on dialysis [facility])   History of enucleation of left eyeball   Metabolic encephalopathy    Plan: Transfer to skilled care facility today after dialysis    LOS: 8 days   Martha [name] 12/08/2018, 8:54 AM

## 2018-12-13 ENCOUNTER — Other Ambulatory Visit: Payer: Self-pay

## 2018-12-13 ENCOUNTER — Ambulatory Visit (INDEPENDENT_AMBULATORY_CARE_PROVIDER_SITE_OTHER): Payer: Medicaid Other | Admitting: Podiatry

## 2018-12-13 ENCOUNTER — Encounter: Payer: Self-pay | Admitting: Podiatry

## 2018-12-13 VITALS — Temp 97.3°F

## 2018-12-13 DIAGNOSIS — S82851A Displaced trimalleolar fracture of right lower leg, initial encounter for closed fracture: Secondary | ICD-10-CM

## 2018-12-13 MED ORDER — HYDROCODONE-ACETAMINOPHEN 10-325 MG PO TABS: 1.0000 | ORAL_TABLET | Freq: Four times a day (QID) | ORAL | 0 refills | Status: DC | PRN

## 2018-12-13 NOTE — Patient Instructions (Signed)
Pre-Operative Instructions  Congratulations, you have decided to take an important step towards improving your quality of life.  You can be assured that the doctors and staff at [facility] will be with you every step of the way.  Here are some important things you should know:  1. Plan to be at the surgery center/hospital at least 1 (one) hour prior to your scheduled time, unless otherwise directed by the surgical center/hospital staff.  You must have a responsible adult accompany you, remain during the surgery and drive you home.  Make sure you have directions to the surgical center/hospital to ensure you arrive on time. 2. If you are having surgery at [facility] or [facility] hospitals, you will need a copy of your medical history and physical form from your family physician within one month prior to the date of surgery. We will give you a form for your primary physician to complete.  3. We make every effort to accommodate the date you request for surgery.  However, there are times where surgery dates or times have to be moved.  We will contact you as soon as possible if a change in schedule is required.   4. No aspirin/ibuprofen for one week before surgery.  If you are on aspirin, any non-steroidal anti-inflammatory medications (Mobic, Aleve, Ibuprofen) should not be taken seven (7) days prior to your surgery.  You make take Tylenol for pain prior to surgery.  5. Medications - If you are taking daily heart and blood pressure medications, seizure, reflux, allergy, asthma, anxiety, pain or diabetes medications, make sure you notify the surgery center/hospital before the day of surgery so they can tell you which medications you should take or avoid the day of surgery. 6. No food or drink after midnight the night before surgery unless directed otherwise by surgical center/hospital staff. 7. No alcoholic beverages 24-hours prior to surgery.  No smoking 24-hours prior or 24-hours after  surgery. 8. Wear loose pants or shorts. They should be loose enough to fit over bandages, boots, and casts. 9. Don't wear slip-on shoes. Sneakers are preferred. 10. Bring your boot with you to the surgery center/hospital.  Also bring crutches or a walker if your physician has prescribed it for you.  If you do not have this equipment, it will be provided for you after surgery. 11. If you have not been contacted by the surgery center/hospital by the day before your surgery, call to confirm the date and time of your surgery. 12. Leave-time from work may vary depending on the type of surgery you have.  Appropriate arrangements should be made prior to surgery with your employer. 13. Prescriptions will be provided immediately following surgery by your doctor.  Fill these as soon as possible after surgery and take the medication as directed. Pain medications will not be refilled on weekends and must be approved by the doctor. 14. Remove nail polish on the operative foot and avoid getting pedicures prior to surgery. 15. Wash the night before surgery.  The night before surgery wash the foot and leg well with water and the antibacterial soap provided. Be sure to pay special attention to beneath the toenails and in between the toes.  Wash for at least three (3) minutes. Rinse thoroughly with water and dry well with a towel.  Perform this wash unless told not to do so by your physician.  Enclosed: 1 Ice pack (please put in freezer the night before surgery)   1 Hibiclens skin cleaner     Pre-op instructions  If you have any questions regarding the instructions, please do not hesitate to call our office.  [place]: [address], [place], [place] [address] -- [phone]  [place]: [address] [place], [place] [address] -- [phone]  [place]: [address]  [place], [place] [address] -- [phone]  [place]: [address], [place], [place] [address] -- [phone]  Website:  [email or web address] 

## 2018-12-13 NOTE — Progress Notes (Signed)
HPI: 53 year old female with multiple comorbidities presents today for evaluation of right ankle fracture.  Patient states that she broke her ankle when she stepped out of her bed on 11/29/2018.  She presented to the emergency department and discharged with a posterior splint with instructions for follow-up to schedule outpatient surgical ORIF.  Radiographs were positive for trimalleolar ankle fracture with displacement.  Patient was ambulatory prior to the injury and she is currently unable to walk.  Patient presents today in severe pain wheelchair bound to discuss surgical options regarding repair of the ankle fracture  Past Medical History:  Diagnosis Date  . Acid reflux    takes Tums  . Anemia   . Arthritis   . Bipolar 1 disorder ([place])   . Blindness of left eye   . Carpal tunnel syndrome, bilateral   . Cervical radiculopathy   . CHF (congestive heart failure) ([place])   . Chronic kidney disease    Stage 5- 01/25/17  . Constipation   . COPD (chronic obstructive pulmonary disease) ([place]) 2014   bronchitis  . Degenerative disc disease, thoracic   . Depression   . Diabetes mellitus    Type II  . Diabetic retinopathy ([place])   . Dyspnea    when walking  . End stage kidney disease ([place])    M, W, F [facility]  . Hypertension   . Noncompliance with medication regimen   . Noncompliance with medication regimen   . Obesity (BMI 30-39.9)   . OSA (obstructive sleep apnea)    cpap  . Panic attack   . RLS (restless legs syndrome)      Physical Exam: General: The patient is alert and oriented x3 in no acute distress.  Dermatology: Skin is warm, dry and supple bilateral lower extremities.  Negative for any open lesions.  Vascular: Palpable pedal pulses bilaterally.  Capillary refill within normal limits.  Moderate edema right lower extremity.  Ecchymosis also noted diffusely to the right foot and ankle  Neurological: Epicritic and protective threshold diminished bilaterally.    Musculoskeletal Exam: Severe pain right ankle.  Radiographs taken in the ED consistent with trimalleolar ankle fracture right.  Assessment: 1.  Trimalleolar ankle fracture right 2.  Severe pain and edema right   Plan of Care:  1. Patient evaluated. X-Rays reviewed that were taken on 11/29/2018 in the ED.  2.  Had a very frank conversation with the patient.  Patient is high risk for surgery however I do believe surgery is warranted and needed otherwise she will likely never walk on her right foot and ankle again.  Patient agrees.  She would like to proceed with surgical ORIF. 3.  All possible complications and details the procedure were explained.  No guarantees were expressed or implied.  All patient questions answered.  Authorization for surgery initiated today.  Surgery will consist of: ORIF right trimalleolar ankle fracture. 4.  Prescription for Vicodin 10/325 mg every 6 hours as needed pain 5.  Posterior splint was removed today.  Foot was evaluated and exam performed.  Right lower extremity was redressed with Ace wrap and immobilization cam boot.  Keep clean dry and intact until surgery. 6.  Return to clinic 1 week postop      Edrick Kins, DPM [facility]  Dr. Edrick Kins, DPM    [address]  [place], [place] [address]                Office [phone]  Fax [phone]

## 2018-12-14 ENCOUNTER — Telehealth: Payer: Self-pay | Admitting: *Deleted

## 2018-12-14 DIAGNOSIS — S82851A Displaced trimalleolar fracture of right lower leg, initial encounter for closed fracture: Secondary | ICD-10-CM

## 2018-12-14 NOTE — Telephone Encounter (Signed)
I attempted to call the patient to inform her that we have her scheduled for surgery at [facility] on Friday, May 22 at 2:30 pm.  I could not leave a message because her mailbox has not been set up on her home number and her cell number gave a busy signal.

## 2018-12-14 NOTE — Telephone Encounter (Signed)
I spoke with Martha [name] and informed I had clinicals and Treatment Plan and pt was to have surgery on 12/17/2018. Martha [name] states they had not been informed of the surgery. I faxed the clinicals of 12/13/2018 and fax cover sheet with Martha [name] - Surgery Coordinator [phone] for more information concerning surgery and pre-op preparation. I told Martha [name] I would be reference for post-op information.

## 2018-12-14 NOTE — Telephone Encounter (Signed)
[place] asked if there were new orders pt, pt received a new brace at last appt.

## 2018-12-14 NOTE — Telephone Encounter (Signed)
[place] Manager states she needs to clarify date and time for pt's surgery for transportation services and post op care orders.

## 2018-12-14 NOTE — Telephone Encounter (Signed)
I called [place] and informed her our surgery coordinator stated pt's surgery is at the [facility] at 2:15pm and is to arrive no later than 12:15pm.

## 2018-12-15 NOTE — Telephone Encounter (Signed)
I attempted to call the patient again to inform her of her surgery.  She did not answer on her main number and her mobile number gave a busy signal.  I found out she is at [facility] for rehabilitation.  I left [place] Friday a message with all details about her surgery.

## 2018-12-15 NOTE — Telephone Encounter (Signed)
I called and left a message for Martha [name].  I informed her that the patient is scheduled to have surgery on [name], Dec 17, 2018 at 2:30 pm at [facility].  She will most likely have to arrive about two hours prior to that time.  She will have a 23 hour observation stay after the surgery.  I also left her another message informing her that Dr. Amalia Hailey would like Martha [name] to stay at [facility] for three months post-operative for rehabilitation.  I asked her to call if she has any questions.  I put in an order for rehab.

## 2018-12-16 ENCOUNTER — Encounter (HOSPITAL_COMMUNITY): Payer: Self-pay | Admitting: Emergency Medicine

## 2018-12-16 ENCOUNTER — Telehealth: Payer: Self-pay | Admitting: *Deleted

## 2018-12-16 ENCOUNTER — Inpatient Hospital Stay (HOSPITAL_COMMUNITY)
Admission: EM | Admit: 2018-12-16 | Discharge: 2018-12-19 | DRG: 492 | Disposition: A | Discharge status: Skilled Nursing Facility | Payer: Medicaid Other | Source: Skilled Nursing Facility | Attending: Internal Medicine | Admitting: Internal Medicine

## 2018-12-16 ENCOUNTER — Other Ambulatory Visit: Payer: Self-pay

## 2018-12-16 DIAGNOSIS — E1165 Type 2 diabetes mellitus with hyperglycemia: Secondary | ICD-10-CM | POA: Diagnosis present

## 2018-12-16 DIAGNOSIS — E1122 Type 2 diabetes mellitus with diabetic chronic kidney disease: Secondary | ICD-10-CM | POA: Diagnosis present

## 2018-12-16 DIAGNOSIS — S82851S Displaced trimalleolar fracture of right lower leg, sequela: Secondary | ICD-10-CM

## 2018-12-16 DIAGNOSIS — F319 Bipolar disorder, unspecified: Secondary | ICD-10-CM | POA: Diagnosis not present

## 2018-12-16 DIAGNOSIS — Z9114 Patient's other noncompliance with medication regimen: Secondary | ICD-10-CM | POA: Diagnosis not present

## 2018-12-16 DIAGNOSIS — X58XXXA Exposure to other specified factors, initial encounter: Secondary | ICD-10-CM | POA: Diagnosis present

## 2018-12-16 DIAGNOSIS — Z6841 Body Mass Index (BMI) 40.0 and over, adult: Secondary | ICD-10-CM

## 2018-12-16 DIAGNOSIS — Z87891 Personal history of nicotine dependence: Secondary | ICD-10-CM

## 2018-12-16 DIAGNOSIS — S82851A Displaced trimalleolar fracture of right lower leg, initial encounter for closed fracture: Secondary | ICD-10-CM | POA: Diagnosis not present

## 2018-12-16 DIAGNOSIS — J449 Chronic obstructive pulmonary disease, unspecified: Secondary | ICD-10-CM | POA: Diagnosis present

## 2018-12-16 DIAGNOSIS — G4733 Obstructive sleep apnea (adult) (pediatric): Secondary | ICD-10-CM | POA: Diagnosis present

## 2018-12-16 DIAGNOSIS — Z9001 Acquired absence of eye: Secondary | ICD-10-CM | POA: Diagnosis not present

## 2018-12-16 DIAGNOSIS — N2581 Secondary hyperparathyroidism of renal origin: Secondary | ICD-10-CM | POA: Diagnosis present

## 2018-12-16 DIAGNOSIS — M25471 Effusion, right ankle: Secondary | ICD-10-CM | POA: Diagnosis present

## 2018-12-16 DIAGNOSIS — N186 End stage renal disease: Secondary | ICD-10-CM | POA: Diagnosis not present

## 2018-12-16 DIAGNOSIS — Z1159 Encounter for screening for other viral diseases: Secondary | ICD-10-CM

## 2018-12-16 DIAGNOSIS — I10 Essential (primary) hypertension: Secondary | ICD-10-CM | POA: Diagnosis present

## 2018-12-16 DIAGNOSIS — I5032 Chronic diastolic (congestive) heart failure: Secondary | ICD-10-CM | POA: Diagnosis not present

## 2018-12-16 DIAGNOSIS — E785 Hyperlipidemia, unspecified: Secondary | ICD-10-CM | POA: Diagnosis present

## 2018-12-16 DIAGNOSIS — I132 Hypertensive heart and chronic kidney disease with heart failure and with stage 5 chronic kidney disease, or end stage renal disease: Secondary | ICD-10-CM | POA: Diagnosis not present

## 2018-12-16 DIAGNOSIS — Z992 Dependence on renal dialysis: Secondary | ICD-10-CM

## 2018-12-16 DIAGNOSIS — D631 Anemia in chronic kidney disease: Secondary | ICD-10-CM | POA: Diagnosis present

## 2018-12-16 DIAGNOSIS — Z9889 Other specified postprocedural states: Secondary | ICD-10-CM

## 2018-12-16 DIAGNOSIS — G2581 Restless legs syndrome: Secondary | ICD-10-CM | POA: Diagnosis present

## 2018-12-16 DIAGNOSIS — Z970 Presence of artificial eye: Secondary | ICD-10-CM

## 2018-12-16 DIAGNOSIS — F418 Other specified anxiety disorders: Secondary | ICD-10-CM | POA: Diagnosis present

## 2018-12-16 DIAGNOSIS — D649 Anemia, unspecified: Secondary | ICD-10-CM | POA: Diagnosis present

## 2018-12-16 DIAGNOSIS — M25571 Pain in right ankle and joints of right foot: Secondary | ICD-10-CM | POA: Diagnosis present

## 2018-12-16 DIAGNOSIS — Z794 Long term (current) use of insulin: Secondary | ICD-10-CM | POA: Diagnosis not present

## 2018-12-16 DIAGNOSIS — E11319 Type 2 diabetes mellitus with unspecified diabetic retinopathy without macular edema: Secondary | ICD-10-CM | POA: Diagnosis present

## 2018-12-16 DIAGNOSIS — G4730 Sleep apnea, unspecified: Secondary | ICD-10-CM | POA: Diagnosis present

## 2018-12-16 DIAGNOSIS — S82854A Nondisplaced trimalleolar fracture of right lower leg, initial encounter for closed fracture: Secondary | ICD-10-CM | POA: Diagnosis not present

## 2018-12-16 LAB — CBC WITH DIFFERENTIAL/PLATELET
Abs Immature Granulocytes: 0.11 10*3/uL — ABNORMAL HIGH (ref 0.00–0.07)
Basophils Absolute: 0 10*3/uL (ref 0.0–0.1)
Basophils Relative: 1 %
Eosinophils Absolute: 0.4 10*3/uL (ref 0.0–0.5)
Eosinophils Relative: 6 %
HCT: 24.7 % — ABNORMAL LOW (ref 36.0–46.0)
Hemoglobin: 7.3 g/dL — ABNORMAL LOW (ref 12.0–15.0)
Immature Granulocytes: 2 %
Lymphocytes Relative: 24 %
Lymphs Abs: 1.6 10*3/uL (ref 0.7–4.0)
MCH: 27.5 pg (ref 26.0–34.0)
MCHC: 29.6 g/dL — ABNORMAL LOW (ref 30.0–36.0)
MCV: 93.2 fL (ref 80.0–100.0)
Monocytes Absolute: 0.6 10*3/uL (ref 0.1–1.0)
Monocytes Relative: 8 %
Neutro Abs: 4.1 10*3/uL (ref 1.7–7.7)
Neutrophils Relative %: 59 %
Platelets: 155 10*3/uL (ref 150–400)
RBC: 2.65 MIL/uL — ABNORMAL LOW (ref 3.87–5.11)
RDW: 18.5 % — ABNORMAL HIGH (ref 11.5–15.5)
WBC: 6.8 10*3/uL (ref 4.0–10.5)
nRBC: 0 % (ref 0.0–0.2)

## 2018-12-16 LAB — BASIC METABOLIC PANEL
Anion gap: 15 (ref 5–15)
BUN: 61 mg/dL — ABNORMAL HIGH (ref 6–20)
CO2: 25 mmol/L (ref 22–32)
Calcium: 8.7 mg/dL — ABNORMAL LOW (ref 8.9–10.3)
Chloride: 96 mmol/L — ABNORMAL LOW (ref 98–111)
Creatinine, Ser: 10.74 mg/dL — ABNORMAL HIGH (ref 0.44–1.00)
GFR calc Af Amer: 4 mL/min — ABNORMAL LOW (ref >60)
GFR calc non Af Amer: 4 mL/min — ABNORMAL LOW (ref >60)
Glucose, Bld: 188 mg/dL — ABNORMAL HIGH (ref 70–99)
Potassium: 3.7 mmol/L (ref 3.5–5.1)
Sodium: 136 mmol/L (ref 135–145)

## 2018-12-16 LAB — PREPARE RBC (CROSSMATCH)

## 2018-12-16 LAB — HEMOGLOBIN AND HEMATOCRIT, BLOOD
HCT: 28.6 % — ABNORMAL LOW (ref 36.0–46.0)
Hemoglobin: 8.6 g/dL — ABNORMAL LOW (ref 12.0–15.0)

## 2018-12-16 LAB — SARS CORONAVIRUS 2 BY RT PCR (HOSPITAL ORDER, PERFORMED IN ~~LOC~~ HOSPITAL LAB): SARS Coronavirus 2: NEGATIVE

## 2018-12-16 MED ORDER — SODIUM CHLORIDE 0.9 % IV SOLN: 100.0000 mL | INTRAVENOUS | Status: DC | PRN

## 2018-12-16 MED ORDER — CHLORHEXIDINE GLUCONATE CLOTH 2 % EX PADS: 6.0000 | MEDICATED_PAD | Freq: Every day | CUTANEOUS | Status: DC

## 2018-12-16 MED ORDER — HEPARIN SODIUM (PORCINE) 1000 UNIT/ML DIALYSIS
1000.0000 [IU] | INTRAMUSCULAR | Status: DC | PRN
  Filled 2018-12-16: qty 1

## 2018-12-16 MED ORDER — DEXTROSE 5 % IV SOLN
3.0000 g | INTRAVENOUS | Status: AC
  Administered 2018-12-17: 3 g via INTRAVENOUS
  Filled 2018-12-16: qty 3

## 2018-12-16 MED ORDER — FENTANYL CITRATE (PF) 100 MCG/2ML IJ SOLN
50.0000 ug | Freq: Once | INTRAMUSCULAR | Status: AC
  Administered 2018-12-16: 50 ug via INTRAVENOUS
  Filled 2018-12-16: qty 2

## 2018-12-16 MED ORDER — HEPARIN SODIUM (PORCINE) 1000 UNIT/ML DIALYSIS
40.0000 [IU]/kg | INTRAMUSCULAR | Status: DC | PRN
  Administered 2018-12-16: 6300 [IU] via INTRAVENOUS_CENTRAL
  Filled 2018-12-16 (×3): qty 7

## 2018-12-16 MED ORDER — HEPARIN SODIUM (PORCINE) 1000 UNIT/ML DIALYSIS
3000.0000 [IU] | INTRAMUSCULAR | Status: DC | PRN
  Filled 2018-12-16: qty 3

## 2018-12-16 MED ORDER — SODIUM CHLORIDE 0.9% IV SOLUTION
Freq: Once | INTRAVENOUS | Status: AC
  Administered 2018-12-16: 19:00:00 via INTRAVENOUS

## 2018-12-16 MED ORDER — ALTEPLASE 2 MG IJ SOLR
2.0000 mg | Freq: Once | INTRAMUSCULAR | Status: DC | PRN
  Filled 2018-12-16: qty 2

## 2018-12-16 MED ORDER — ALBUMIN HUMAN 25 % IV SOLN
50.0000 g | Freq: Once | INTRAVENOUS | Status: AC
  Administered 2018-12-16: 19:00:00 50 g via INTRAVENOUS
  Filled 2018-12-16: qty 200

## 2018-12-16 MED ORDER — ALBUMIN HUMAN 25 % IV SOLN
INTRAVENOUS | Status: AC
  Administered 2018-12-16: 50 g via INTRAVENOUS
  Filled 2018-12-16: qty 200

## 2018-12-16 NOTE — Progress Notes (Signed)
Pt is a resident at [facility] in [place]. Spoke with Jamelle Rushing, LPN for pre-op call. She states pt is alert, oriented and able to speak for herself. Jamelle Rushing states pt has refused dialysis for the last 4 days that she is aware of. States she is depressed and "doesn't want to go". Pt is a type 2 diabetic. No recent A1C found in Epic and [place] states that they have not done one on her recently. She states pt's fasting blood sugar Is usually around 120-160. Jamelle Rushing states pt has not had any Covid 19 symptoms and states that the building pt is in has not had any patients positive for Covid 19.  Spoke with [place], [place] about pt refusing dialysis. He instructed me to call Dr. Amalia Hailey office and notify him. Have been on hold for 15 minutes trying to talk with his nurse.

## 2018-12-16 NOTE — ED Notes (Signed)
Pt now 92% RA on 1L

## 2018-12-16 NOTE — Telephone Encounter (Signed)
I spoke with [place] and she states pt I alert, oriented and will allow herself to be prepped for dialysis but once in the facility refuses, appears to be happy just lay in bed. Katherine Basset and I discuss pt's overall health status as being poor if she does not receive dialysis. Katherine Basset states she can complete paperwork to have pt transferred to the required closest ED - [facility], then [facility] can transfer to [facility] ED for dialysis and later surgery. I told Katherine Basset I would call [facility] ED and [facility] ED and inform what was needed.

## 2018-12-16 NOTE — ED Provider Notes (Addendum)
[facility] EMERGENCY DEPARTMENT Provider Note   CSN: [id number] Arrival date & time: 12/16/18  1503    History   Chief Complaint Chief Complaint  Patient presents with  . Ankle Pain    HPI Martha [name] is a 53 y.o. female.     Level 5 caveat for inability to give full history.  Complex patient with multiple health problems including end-stage renal disease on dialysis on Monday Wednesday Friday, bipolar 1 disorder, obesity, COPD, diabetes, noncompliance, many others presents with missing dialysis yesterday.  She is allegedly scheduled for surgery tomorrow at [facility] on her right ankle trimalleolar fracture by Dr Daylene Katayama podiatrist.  She needs dialysis today.  No other specific complaints.  She is allegedly to be transported to [facility] after dialysis.     Past Medical History:  Diagnosis Date  . Acid reflux    takes Tums  . Anemia   . Arthritis   . Bipolar 1 disorder ([place])   . Blindness of left eye   . Carpal tunnel syndrome, bilateral   . Cervical radiculopathy   . CHF (congestive heart failure) ([place])   . Chronic kidney disease    Stage 5- 01/25/17  . Constipation   . COPD (chronic obstructive pulmonary disease) ([place]) 2014   bronchitis  . Degenerative disc disease, thoracic   . Depression   . Diabetes mellitus    Type II  . Diabetic retinopathy ([place])   . Dyspnea    when walking  . End stage kidney disease ([place])    M, W, F [facility]  . Hypertension   . Noncompliance with medication regimen   . Noncompliance with medication regimen   . Obesity (BMI 30-39.9)   . OSA (obstructive sleep apnea)    cpap  . Panic attack   . RLS (restless legs syndrome)     Patient Active Problem List   Diagnosis Date Noted  . Metabolic encephalopathy 11/94/1740  . SIRS (systemic inflammatory response syndrome) ([place]) 06/23/2018  . UTI (urinary tract infection) 02/26/2018  . Volume overload 02/25/2018  . History of enucleation of left eyeball  02/04/2018  . Dialysis patient ([place]) 12/13/2017  . ESRD (end stage renal disease) on dialysis ([place])   . Chronic diarrhea 10/08/2017  . Renal failure (ARF), acute on chronic (HCC) 04/22/2017  . Anasarca associated with disorder of kidney 04/20/2017  . Protein-calorie malnutrition, severe 11/29/2016  . HTN (hypertension), malignant 11/20/2016  . Bipolar 1 disorder ([place]) 11/20/2016  . Noncompliance with medication regimen 11/20/2016  . Panic attack 11/20/2016  . Uncontrolled type 2 diabetes mellitus with hyperglycemia, without long-term current use of insulin ([place]) 10/27/2016  . Sensory disturbance 10/27/2016  . Left hemiparesis ([place]) 10/27/2016  . Cholelithiasis 04/26/2014  . Hyponatremia 04/26/2014  . Gallstones 04/06/2014  . Diabetic ulcer of left great toe ([place]) 10/09/2013  . Type 2 diabetes mellitus treated with insulin ([place]) 05/30/2013  . Hyperglycemia 05/30/2013  . Internal hemorrhoids with other complication 81/44/8185  . Umbilical hernia without mention of obstruction or gangrene 04/15/2013  . Anemia 04/15/2013  . DM 05/13/2010  . Hyperlipidemia 05/13/2010  . Morbid obesity ([place]) 05/13/2010  . Depression with anxiety 05/13/2010  . RESTLESS LEG SYNDROME 05/13/2010  . Essential hypertension 05/13/2010  . GERD 05/13/2010  . RECTAL BLEEDING 05/13/2010  . Sleep apnea 05/13/2010  . Dysphagia, oropharyngeal phase 05/13/2010    Past Surgical History:  Procedure Laterality Date  . AV FISTULA PLACEMENT Right 01/27/2017   Procedure: ARTERIOVENOUS (AV)  FISTULA CREATION-RIGHT ARM;  Surgeon: Elam Dutch, MD;  Location: [facility] OR;  Service: Vascular;  Laterality: Right;  . AV FISTULA PLACEMENT Left 08/03/2018   Procedure: LEFT ARTERIOVENOUS (AV) FISTULA CREATION;  Surgeon: Elam Dutch, MD;  Location: [place];  Service: Vascular;  Laterality: Left;  . BIOPSY N/A 05/17/2013   Procedure: BIOPSY;  Surgeon: [name], MD;  Location: [facility];  Service: Endoscopy;  Laterality: N/A;   . CARPAL TUNNEL RELEASE Left 11/25/2018   Procedure: LEFT CARPAL TUNNEL RELEASE;  Surgeon: Charlotte Crumb, MD;  Location: [place];  Service: Orthopedics;  Laterality: Left;  . CHOLECYSTECTOMY N/A 04/27/2014   Procedure: LAPAROSCOPIC CHOLECYSTECTOMY;  Surgeon: [name], MD;  Location: [facility];  Service: General;  Laterality: N/A;  . COLONOSCOPY  06/06/2010   VHQ:IONGEX bleeding secondary to internal hemorrhoids but incomplete evaluation secondary to poor right colon prep/small rectal and sigmoid colon polyps (hyperplastic). PROPOFOL  . COLONOSCOPY  May 2013   Dr. Gilliam/[facility]: 5 mm a descending colon polyp, hyperplastic. Adequate bowel prep.  . ENDOMETRIAL ABLATION    . 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 ENUCLEATION Left 02/04/2018   Procedure: ENUCLEATION WITH PLACEMENT OF IMPLANT LEFT EYE;  Surgeon: Clista Bernhardt, MD;  Location: [place];  Service: Ophthalmology;  Laterality: Left;  . ESOPHAGOGASTRODUODENOSCOPY  06/06/2010   BMW:UXLKGMW erythema and edema of body of stomach, with sessile polypoid lesions. bx benign. no h.pylori  . ESOPHAGOGASTRODUODENOSCOPY (EGD) WITH PROPOFOL N/A 05/17/2013   Dr. Oneida Alar: normal esophagus, moderate nodular gastritis, negative path, empiric Savary dilation  . EYE SURGERY  07/2017   sx for glaucoma  . FLEXIBLE SIGMOIDOSCOPY N/A 05/17/2013   Dr. Oneida Alar: moderate sized internal hemorrhoids  . HEMORRHOID BANDING N/A 05/17/2013   Procedure: HEMORRHOID BANDING;  Surgeon: [name], MD;  Location: [facility];  Service: Endoscopy;  Laterality: N/A;  2 bands placed  . INCISION AND DRAINAGE ABSCESS Left 10/11/2013   Procedure: INCISION AND DRAINAGE AND DEBRIDEMENT LEFT BREAST  ABSCESS;  Surgeon: [name], MD;  Location: [facility];  Service: General;  Laterality: Left;  . INSERTION OF DIALYSIS CATHETER N/A 06/08/2018   Procedure: INSERTION OF Right internal jugular TUNNELED  DIALYSIS CATHETER;  Surgeon:  Angelia Mould, MD;  Location: [place];  Service: Vascular;  Laterality: N/A;  . IRRIGATION AND DEBRIDEMENT ABSCESS Right 06/01/2013   Procedure: INCISION AND DRAINAGE AND DEBRIDEMENT ABSCESS RIGHT BREAST;  Surgeon: [name], MD;  Location: [facility];  Service: General;  Laterality: Right;  . LEFT EYE REMOVED Left 01/2018   [facility] on [address].  Marland Kitchen LIGATION OF ARTERIOVENOUS  FISTULA Right 06/08/2018   Procedure: LIGATION OF ARTERIOVENOUS  FISTULA RIGHT ARM;  Surgeon: Angelia Mould, MD;  Location: [place];  Service: Vascular;  Laterality: Right;  . SAVORY DILATION N/A 05/17/2013   Procedure: SAVORY DILATION;  Surgeon: [name], MD;  Location: [facility];  Service: Endoscopy;  Laterality: N/A;  14/15/16  . SKIN FULL THICKNESS GRAFT Left 05/05/2018   Procedure: ABDOMINAL DERMIS FAT SKIN GRAFT FULL THICKNESS LEFT EYE;  Surgeon: Clista Bernhardt, MD;  Location: [place];  Service: Ophthalmology;  Laterality: Left;  . TUBAL LIGATION       OB History    Gravida  2   Para  2   Term  1   Preterm  1   AB      Living  2  SAB      TAB      Ectopic      Multiple      Live Births               Home Medications    Prior to Admission medications   Medication Sig Start Date End Date Taking? Authorizing Provider  ADMELOG SOLOSTAR 100 UNIT/ML KwikPen Inject 1-8 Units into the skin at bedtime. Sliding Scale Insulin: 080-200=1 unit, 201-250=3 units, 251-300=5 units, 301-350=6 units, & 351-400=8 units, if greater than 400 notify MD   Yes [provider]  ALPRAZolam (XANAX) 0.5 MG tablet Take 0.5 mg by mouth 4 (four) times daily.    Yes [provider]  atorvastatin (LIPITOR) 20 MG tablet Take 20 mg by mouth daily.    Yes [provider]  bisacodyl (DULCOLAX) 10 MG suppository Place 1 suppository (10 mg total) rectally daily as needed for moderate constipation. 12/03/18  Yes Sinda Du, MD  busPIRone (BUSPAR) 7.5 MG  tablet Take 7.5 mg by mouth 2 (two) times daily.   Yes [provider]  calcium carbonate (TUMS EX) 750 MG chewable tablet Chew 2 tablets by mouth See admin instructions. Take 2 tablets by mouth every 8 hours as needed for gerd & Take 2 tablets with each meal for gerd   Yes [provider]  cinacalcet (SENSIPAR) 30 MG tablet Take 30 mg by mouth daily.   Yes [provider]  colchicine 0.6 MG tablet Take 1 tablet (0.6 mg total) by mouth every Tuesday, Thursday, and Saturday at 6 PM. Take 1 tablet every other day Patient taking differently: Take 0.6 mg by mouth every other day.  09/30/18  Yes Johnson, Clanford L, MD  doxercalciferol (HECTOROL) 4 MCG/2ML injection Inject 0.5 mLs (1 mcg total) into the vein every Monday, Wednesday, and Friday with hemodialysis. 12/03/18  Yes Sinda Du, MD  epoetin alfa (EPOGEN) 10000 UNIT/ML injection Inject 1 mL (10,000 Units total) into the vein 3 (three) times a week. 12/03/18  Yes Sinda Du, MD  HYDROcodone-acetaminophen [facility]) 10-325 MG tablet Take 1 tablet by mouth every 6 (six) hours as needed. Patient taking differently: Take 1 tablet by mouth every 6 (six) hours as needed (pain.).  12/13/18  Yes Edrick Kins, DPM  Insulin Glargine (BASAGLAR KWIKPEN) 100 UNIT/ML SOPN Inject 30 Units into the skin daily.   Yes [provider]  Insulin Glargine (LANTUS SOLOSTAR) 100 UNIT/ML Solostar Pen Inject 30 Units into the skin every morning. And pen needles 1/day 06/29/18  Yes Renato Shin, MD  lidocaine-prilocaine (EMLA) cream Apply 1 application topically as needed (topical anesthesia for hemodialysis if Gebauers and Lidocaine injection are ineffective.). Patient taking differently: Apply 1 application topically daily as needed (dialysis.).  04/25/17  Yes Johnson, Clanford L, MD  losartan (COZAAR) 25 MG tablet Take 25 mg by mouth daily.   Yes [provider]  ondansetron (ZOFRAN) 4 MG tablet Take 1 tablet (4 mg total) by  mouth every 6 (six) hours as needed for nausea. 12/02/18  Yes Sinda Du, MD  PARoxetine (PAXIL) 20 MG tablet Take 1 daily. This is to prevent panic attacks Patient taking differently: Take 20 mg by mouth daily. This is to prevent panic attacks 10/28/16  Yes Sinda Du, MD  polyethylene glycol (MIRALAX / GLYCOLAX) 17 g packet Take 17 g by mouth daily as needed for mild constipation. 12/02/18  Yes Sinda Du, MD  torsemide (DEMADEX) 100 MG tablet Take 100 mg by mouth daily.  Yes [provider]  acetaminophen (TYLENOL) 325 MG tablet Take 2 tablets (650 mg total) by mouth every 6 (six) hours as needed for mild pain (or Fever >/= 101). Patient not taking: Reported on 12/14/2018 12/02/18   Sinda Du, MD  ALPRAZolam Duanne Moron) 1 MG tablet Take 0.5 tablets (0.5 mg total) by mouth 4 (four) times daily. Patient not taking: Reported on 12/16/2018 12/02/18   Sinda Du, MD  diphenhydrAMINE (BENADRYL) 25 mg capsule Take 1 capsule (25 mg total) by mouth every 6 (six) hours as needed for itching. Patient not taking: Reported on 12/14/2018 12/03/18   Sinda Du, MD  insulin aspart (NOVOLOG) 100 UNIT/ML injection Inject 0-9 Units into the skin 3 (three) times daily with meals. Patient not taking: Reported on 12/14/2018 12/02/18   Sinda Du, MD    Family History Family History  Adopted: Yes  Family history unknown: Yes    Social History Social History   Tobacco Use  . Smoking status: Former Smoker    Packs/day: 1.50    Years: 25.00    Pack years: 37.50    Types: Cigarettes    Last attempt to quit: 10/23/2007    Years since quitting: 11.1  . Smokeless tobacco: Never Used  Substance Use Topics  . Alcohol use: No  . Drug use: No     Allergies   Codeine and Tape   Review of Systems Review of Systems  Unable to perform ROS: Other     Physical Exam Updated Vital Signs BP 123/77   Pulse 83   Temp 97.9 F (36.6 C) (Oral)   Resp 18   Ht 5\' 8"  (1.727 m)   Wt  (!) 156.5 kg   LMP 08/03/2008 (Exact Date)   SpO2 97%   BMI 52.46 kg/m   Physical Exam Vitals signs and nursing note reviewed.  Constitutional:      Appearance: She is well-developed.     Comments: Obese, no gross confusion noted  HENT:     Head: Normocephalic and atraumatic.  Eyes:     Conjunctiva/sclera: Conjunctivae normal.  Neck:     Musculoskeletal: Neck supple.  Cardiovascular:     Rate and Rhythm: Normal rate and regular rhythm.  Pulmonary:     Effort: Pulmonary effort is normal.     Breath sounds: Normal breath sounds.  Abdominal:     General: Bowel sounds are normal.     Palpations: Abdomen is soft.  Musculoskeletal:     Comments: Right ankle: Postop boot in place  Skin:    General: Skin is warm and dry.  Neurological:     Comments: Moving all extremities  Psychiatric:        Behavior: Behavior normal.      ED Treatments / Results  Labs (all labs ordered are listed, but only abnormal results are displayed) Labs Reviewed  CBC WITH DIFFERENTIAL/PLATELET - Abnormal; Notable for the following components:      Result Value   RBC 2.65 (*)    Hemoglobin 7.3 (*)    HCT 24.7 (*)    MCHC 29.6 (*)    RDW 18.5 (*)    Abs Immature Granulocytes 0.11 (*)    All other components within normal limits  BASIC METABOLIC PANEL - Abnormal; Notable for the following components:   Chloride 96 (*)    Glucose, Bld 188 (*)    BUN 61 (*)    Creatinine, Ser 10.74 (*)    Calcium 8.7 (*)    GFR calc non  Af Amer 4 (*)    GFR calc Af Amer 4 (*)    All other components within normal limits  TYPE AND SCREEN  PREPARE RBC (CROSSMATCH)    EKG None  Radiology No results found.  Procedures Procedures (including critical care time)  Medications Ordered in ED Medications  Chlorhexidine Gluconate Cloth 2 % PADS 6 each (has no administration in time range)  0.9 %  sodium chloride infusion (has no administration in time range)  0.9 %  sodium chloride infusion (has no  administration in time range)  alteplase (CATHFLO ACTIVASE) injection 2 mg (has no administration in time range)  heparin injection 6,300 Units (has no administration in time range)  albumin human 25 % solution 50 g (has no administration in time range)  heparin injection 3,000 Units (has no administration in time range)  albumin human 25 % solution (has no administration in time range)  0.9 %  sodium chloride infusion (Manually program via Guardrails IV Fluids) (has no administration in time range)     Initial Impression / Assessment and Plan / ED Course  I have reviewed the triage vital signs and the nursing notes.  Pertinent labs & imaging results that were available during my care of the patient were reviewed by me and considered in my medical decision making (see chart for details).        Complex patient with end-stage renal disease in need of dialysis today.  I was able to discuss her care with the nephrologist on-call Dr. Posey Pronto.  We will dialyze her here at [facility]  Then, will get her admitted to [facility] via the hospitalist program for her right ankle surgery tomorrow with Dr. Daylene Katayama  Final Clinical Impressions(s) / ED Diagnoses   Final diagnoses:  ESRD (end stage renal disease) [facility])  Closed fracture of ankle, trimalleolar, right, sequela    ED Discharge Orders    None       Nat Christen, MD 12/16/18 1709    Nat Christen, MD 12/16/18 [phone]

## 2018-12-16 NOTE — ED Notes (Signed)
Dialysis Nurse at bedside.

## 2018-12-16 NOTE — ED Triage Notes (Signed)
Pt sent to ED from [facility] for Dialysis today and transfer to [facility] for scheduled ankle surgery tomorrow.

## 2018-12-16 NOTE — Procedures (Addendum)
     HEMODIALYSIS TREATMENT NOTE:  4 hour heparinized dialysis session completed in emergency department via right IJ tunneled catheter. Exit site unremarkable. Goal met: 2.5 L removed without interruption in ultrafiltration.  Albumin 50g given x1 for BP support.  Pre-HD Hgb 7.3; one unit PRBCs transfused with HD.  Repeat Hgb ^8.6.  All blood was returned.   Treatment balance: Intake: NS------------- 300cc Albumin------  100cc PRBCs------  315cc  Total intake 715 cc + (-3300cc removed)---->> NET UF 2585 cc  Pt to be transferred to [facility] for ORIF of right ankle fracture (scheduled for 12/17/18).  Syd Manges L. Ebbie Sorenson, RN

## 2018-12-16 NOTE — ED Notes (Signed)
Per Dr. Lacinda Axon. Pt will have dialysis in the ED

## 2018-12-16 NOTE — ED Notes (Signed)
Lab says they are waiting for a phlebotomist to come draw a type and screen

## 2018-12-16 NOTE — Telephone Encounter (Signed)
I spoke to [facility] ED - Charge nurse-Karen Cobb and informed that pt would be coming from [facility] ED and would need dialysis and then later surgery. Charge Nurse-Karen Cobb states their ED doctor would need to take the report from [facility] ED doctor. I told her I understood and just wanted her to have knowledge of the pt's situation.

## 2018-12-16 NOTE — Telephone Encounter (Signed)
Left message iforming Dr. Amalia Hailey pt that needed dialysis was being transported to [facility] ED and would later be transported to [facility] ED for dialysis and possibly surgery later.

## 2018-12-16 NOTE — Telephone Encounter (Signed)
I informed [facility] - Christina-Charge Nurse, pt was to be transferred from their ED to [facility] ED for dialysis then later surgery. [facility] ED - Margreta Journey -Charge Nurse states [facility] ED doctor - Dr. Wilson Singer [phone] needs to be informed of request.

## 2018-12-16 NOTE — ED Notes (Signed)
Per Dr. Lacinda Axon, will work on admission orders after Dialysis

## 2018-12-16 NOTE — Telephone Encounter (Signed)
I spoke with [name] ED - Dr. Wilson Singer and informed of the need to have pt transported to [facility] ED for dialysis after being admitted to [facility] ED. Dr. Wilson Singer states they will take care of it

## 2018-12-16 NOTE — ED Notes (Signed)
Pt's initial sats 81% on RA. Pt placed on 1L. Sats increased to 95%.

## 2018-12-16 NOTE — H&P (Signed)
Patient Demographics:    Martha [name], is a 53 y.o. female  MRN: [id number]   DOB - March 07, 1966  Admit Date - 12/16/2018  Outpatient Primary MD for the patient is Sinda Du, MD   Assessment & Plan:    Principal Problem:   Trimalleolar fracture of ankle, closed, right, initial encounter Active Problems:   Morbid obesity ([name])   Depression with anxiety   Essential hypertension   Sleep apnea   Anemia   Bipolar 1 disorder (HCC)   Noncompliance with medication regimen   Dialysis patient ([name])   ESRD (end stage renal disease) on dialysis [facility])   History of enucleation of left eyeball   Pain and swelling of ankle, right    1)Rt trimalleolar fracture of the ankle--- DOI 11/29/2018, for ORIF by Dr. Daylene Katayama at [place] on 12/17/2018----transfer to [place]... Further management per podiatric team  2)ESRD--- usually gets HD on Mondays Wednesdays and Fridays, patient missed HD on 12/15/2018, last HD 12/16/2018 at [facility], continue torsemide 100 mg daily,  3)HTN--stable at this time continue losartan 25 mg daily,  4) depression/anxiety/bipolar disorder----this impacts patient's ability to be compliant, continue Paxil 20 mg daily, Xanax and buspirone  5)DM2-A1c was 8.8 about 6 months ago reflecting uncontrolled diabetes, decrease Lantus to use insulin to 15 units daily from 30 units as patient will be n.p.o. for right ankle surgery.Marland KitchenMarland KitchenAllow some permissive Hyperglycemia rather than risk life-threatening hypoglycemia in a patient with unreliable oral intake. Use Novolog/Humalog Sliding scale insulin with Accu-Cheks/Fingersticks as ordered  6) chronic anemia of CKD--- hemoglobin currently stable at 8.6, EPO agent as per nephrology team  7) morbid obesity--- BMI over 50, this complicates  overall care and increases more debility/mortality  8)OSA--- compliance with CPAP emphasized  With History of - Reviewed by me  Past Medical History:  Diagnosis Date  . Acid reflux    takes Tums  . Anemia   . Arthritis   . Bipolar 1 disorder ([name])   . Blindness of left eye   . Carpal tunnel syndrome, bilateral   . Cervical radiculopathy   . CHF (congestive heart failure) ([name])   . Chronic kidney disease    Stage 5- 01/25/17  . Constipation   . COPD (chronic obstructive pulmonary disease) ([name]) 2014   bronchitis  . Degenerative disc disease, thoracic   . Depression   . Diabetes mellitus    Type II  . Diabetic retinopathy ([name])   . Dyspnea    when walking  . End stage kidney disease ([name])    M, W, F [facility]  . Hypertension   . Noncompliance with medication regimen   . Noncompliance with medication regimen   . Obesity (BMI 30-39.9)   . OSA (obstructive sleep apnea)    cpap  . Panic attack   . RLS (restless legs syndrome)       Past Surgical History:  Procedure Laterality Date  .  AV FISTULA PLACEMENT Right 01/27/2017   Procedure: ARTERIOVENOUS (AV) FISTULA CREATION-RIGHT ARM;  Surgeon: Elam Dutch, MD;  Location: [facility] OR;  Service: Vascular;  Laterality: Right;  . AV FISTULA PLACEMENT Left 08/03/2018   Procedure: LEFT ARTERIOVENOUS (AV) FISTULA CREATION;  Surgeon: Elam Dutch, MD;  Location: [place];  Service: Vascular;  Laterality: Left;  . BIOPSY N/A 05/17/2013   Procedure: BIOPSY;  Surgeon: [name], MD;  [facility];  Service: Endoscopy;  Laterality: N/A;  . CARPAL TUNNEL RELEASE Left 11/25/2018   Procedure: LEFT CARPAL TUNNEL RELEASE;  Surgeon: Charlotte Crumb, MD;  Location: [place];  Service: Orthopedics;  Laterality: Left;  . CHOLECYSTECTOMY N/A 04/27/2014   Procedure: LAPAROSCOPIC CHOLECYSTECTOMY;  Surgeon: [name], MD;  Location: [facility];  Service: General;  Laterality: N/A;  . COLONOSCOPY  06/06/2010   CHE:NIDPOE bleeding  secondary to internal hemorrhoids but incomplete evaluation secondary to poor right colon prep/small rectal and sigmoid colon polyps (hyperplastic). PROPOFOL  . COLONOSCOPY  May 2013   Dr. Gilliam/[facility]: 5 mm a descending colon polyp, hyperplastic. Adequate bowel prep.  . ENDOMETRIAL ABLATION    . 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 ENUCLEATION Left 02/04/2018   Procedure: ENUCLEATION WITH PLACEMENT OF IMPLANT LEFT EYE;  Surgeon: Clista Bernhardt, MD;  Location: [name];  Service: Ophthalmology;  Laterality: Left;  . ESOPHAGOGASTRODUODENOSCOPY  06/06/2010   UMP:NTIRWER erythema and edema of body of stomach, with sessile polypoid lesions. bx benign. no h.pylori  . ESOPHAGOGASTRODUODENOSCOPY (EGD) WITH PROPOFOL N/A 05/17/2013   Dr. Oneida Alar: normal esophagus, moderate nodular gastritis, negative path, empiric Savary dilation  . EYE SURGERY  07/2017   sx for glaucoma  . FLEXIBLE SIGMOIDOSCOPY N/A 05/17/2013   Dr. Oneida Alar: moderate sized internal hemorrhoids  . HEMORRHOID BANDING N/A 05/17/2013   Procedure: HEMORRHOID BANDING;  Surgeon: [name], MD;  [facility];  Service: Endoscopy;  Laterality: N/A;  2 bands placed  . INCISION AND DRAINAGE ABSCESS Left 10/11/2013   Procedure: INCISION AND DRAINAGE AND DEBRIDEMENT LEFT BREAST  ABSCESS;  Surgeon: [name], MD;  [facility];  Service: General;  Laterality: Left;  . INSERTION OF DIALYSIS CATHETER N/A 06/08/2018   Procedure: INSERTION OF Right internal jugular TUNNELED  DIALYSIS CATHETER;  Surgeon: Angelia Mould, MD;  Location: [name];  Service: Vascular;  Laterality: N/A;  . IRRIGATION AND DEBRIDEMENT ABSCESS Right 06/01/2013   Procedure: INCISION AND DRAINAGE AND DEBRIDEMENT ABSCESS RIGHT BREAST;  Surgeon: [name], MD;  [facility];  Service: General;  Laterality: Right;  . LEFT EYE REMOVED Left 01/2018   [facility] [address].  Marland Kitchen LIGATION  OF ARTERIOVENOUS  FISTULA Right 06/08/2018   Procedure: LIGATION OF ARTERIOVENOUS  FISTULA RIGHT ARM;  Surgeon: Angelia Mould, MD;  Location: [place];  Service: Vascular;  Laterality: Right;  . SAVORY DILATION N/A 05/17/2013   Procedure: SAVORY DILATION;  Surgeon: [name], MD;  [facility];  Service: Endoscopy;  Laterality: N/A;  14/15/16  . SKIN FULL THICKNESS GRAFT Left 05/05/2018   Procedure: ABDOMINAL DERMIS FAT SKIN GRAFT FULL THICKNESS LEFT EYE;  Surgeon: Clista Bernhardt, MD;  Location: [place];  Service: Ophthalmology;  Laterality: Left;  . TUBAL LIGATION        Chief Complaint  Patient presents with  . Ankle Pain      HPI:    Garry Bochicchio  is a 53  y.o. female with past medical history relevant for morbid obesity, OSA on CPAP, ESRD with HD usually on Mondays Wednesdays and Fridays, bipolar disorder and depression, history of medication lifestyle noncompliance, diabetes and hypertension, as well as recent right Ankle trimalleolar fracture  (Rt trimalleolar fracture of the ankle--- DOI 11/29/2018, for ORIF by Dr. Daylene Katayama at [place] on 12/17/2018----transfer to [place]... Further management per podiatric team  Patient apparently has been staying at [facility] in [place]... Missed hemodialysis on 12/15/2018, scheduled for ORIF at [place] for right trimalleolar ankle fracture on 12/17/2018...  Presented to [facility] on 12/16/2018--- received hemodialysis on 12/16/2018.... Now being transferred to [place] for previously scheduled orthopedic/podiatric surgery  Denies chest pains, palpitations, no fevers, no chills,  No URI symptoms, no UTI symptoms  COVID 19 test is Negative   Review of systems:    In addition to the HPI above,   A full Review of  Systems was done, all other systems reviewed are negative except as noted above in HPI , .    Social History:  Reviewed by me    Social History   Tobacco Use  .  Smoking status: Former Smoker    Packs/day: 1.50    Years: 25.00    Pack years: 37.50    Types: Cigarettes    Last attempt to quit: 10/23/2007    Years since quitting: 11.1  . Smokeless tobacco: Never Used  Substance Use Topics  . Alcohol use: No       Family History :  Reviewed by me  Family History  Adopted: Yes  Family history unknown: Yes    Home Medications:   Prior to Admission medications   Medication Sig Start Date End Date Taking? Authorizing Provider  ADMELOG SOLOSTAR 100 UNIT/ML KwikPen Inject 1-8 Units into the skin at bedtime. Sliding Scale Insulin: 080-200=1 unit, 201-250=3 units, 251-300=5 units, 301-350=6 units, & 351-400=8 units, if greater than 400 notify MD   Yes [provider]  ALPRAZolam (XANAX) 0.5 MG tablet Take 0.5 mg by mouth 4 (four) times daily.    Yes [provider]  atorvastatin (LIPITOR) 20 MG tablet Take 20 mg by mouth daily.    Yes [provider]  bisacodyl (DULCOLAX) 10 MG suppository Place 1 suppository (10 mg total) rectally daily as needed for moderate constipation. 12/03/18  Yes Sinda Du, MD  busPIRone (BUSPAR) 7.5 MG tablet Take 7.5 mg by mouth 2 (two) times daily.   Yes [provider]  calcium carbonate (TUMS EX) 750 MG chewable tablet Chew 2 tablets by mouth See admin instructions. Take 2 tablets by mouth every 8 hours as needed for gerd & Take 2 tablets with each meal for gerd   Yes [provider]  cinacalcet (SENSIPAR) 30 MG tablet Take 30 mg by mouth daily.   Yes [provider]  colchicine 0.6 MG tablet Take 1 tablet (0.6 mg total) by mouth every Tuesday, Thursday, and Saturday at 6 PM. Take 1 tablet every other day Patient taking differently: Take 0.6 mg by mouth every other day.  09/30/18  Yes Johnson, Clanford L, MD  doxercalciferol (HECTOROL) 4 MCG/2ML injection Inject 0.5 mLs (1 mcg total) into the vein every Monday, Wednesday, and Friday with hemodialysis. 12/03/18  Yes  Sinda Du, MD  epoetin alfa (EPOGEN) 10000 UNIT/ML injection Inject 1 mL (10,000 Units total) into the vein 3 (three) times a week. 12/03/18  Yes Sinda Du, MD  HYDROcodone-acetaminophen (  NORCO) 10-325 MG tablet Take 1 tablet by mouth every 6 (six) hours as needed. Patient taking differently: Take 1 tablet by mouth every 6 (six) hours as needed (pain.).  12/13/18  Yes Edrick Kins, DPM  Insulin Glargine (BASAGLAR KWIKPEN) 100 UNIT/ML SOPN Inject 30 Units into the skin daily.   Yes [provider]  Insulin Glargine (LANTUS SOLOSTAR) 100 UNIT/ML Solostar Pen Inject 30 Units into the skin every morning. And pen needles 1/day 06/29/18  Yes Renato Shin, MD  lidocaine-prilocaine (EMLA) cream Apply 1 application topically as needed (topical anesthesia for hemodialysis if Gebauers and Lidocaine injection are ineffective.). Patient taking differently: Apply 1 application topically daily as needed (dialysis.).  04/25/17  Yes Johnson, Clanford L, MD  losartan (COZAAR) 25 MG tablet Take 25 mg by mouth daily.   Yes [provider]  ondansetron (ZOFRAN) 4 MG tablet Take 1 tablet (4 mg total) by mouth every 6 (six) hours as needed for nausea. 12/02/18  Yes Sinda Du, MD  PARoxetine (PAXIL) 20 MG tablet Take 1 daily. This is to prevent panic attacks Patient taking differently: Take 20 mg by mouth daily. This is to prevent panic attacks 10/28/16  Yes Sinda Du, MD  polyethylene glycol (MIRALAX / GLYCOLAX) 17 g packet Take 17 g by mouth daily as needed for mild constipation. 12/02/18  Yes Sinda Du, MD  torsemide (DEMADEX) 100 MG tablet Take 100 mg by mouth daily.    Yes [provider]  acetaminophen (TYLENOL) 325 MG tablet Take 2 tablets (650 mg total) by mouth every 6 (six) hours as needed for mild pain (or Fever >/= 101). Patient not taking: Reported on 12/14/2018 12/02/18   Sinda Du, MD  ALPRAZolam Duanne Moron) 1 MG tablet Take 0.5 tablets (0.5 mg total) by mouth 4  (four) times daily. Patient not taking: Reported on 12/16/2018 12/02/18   Sinda Du, MD  diphenhydrAMINE (BENADRYL) 25 mg capsule Take 1 capsule (25 mg total) by mouth every 6 (six) hours as needed for itching. Patient not taking: Reported on 12/14/2018 12/03/18   Sinda Du, MD  insulin aspart (NOVOLOG) 100 UNIT/ML injection Inject 0-9 Units into the skin 3 (three) times daily with meals. Patient not taking: Reported on 12/14/2018 12/02/18   Sinda Du, MD     Allergies:     Allergies  Allergen Reactions  . Codeine Nausea And Vomiting  . Tape Rash and Other (See Comments)    Pull skin off.  Paper tape is ok    Physical Exam:   Vitals  Blood pressure (!) 123/57, pulse 73, temperature 98.1 F (36.7 C), temperature source Oral, resp. rate 20, height 5\' 8"  (1.727 m), weight (!) 156.5 kg, last menstrual period 08/03/2008, SpO2 100 %.  Physical Examination: General appearance - alert, morbidly obese appearing, and in no distress  Mental status - alert, oriented to person, place, and time,  Eyes - sclera anicteric, left eye enucleated Neck - supple, no JVD elevation , right IJ HD hemodialysis catheter Chest - clear  to auscultation bilaterally, symmetrical air movement,  Heart - S1 and S2 normal, regular  Abdomen - soft, nontender, nondistended, increased truncal adiposity Neurological - screening mental status exam normal, neck supple without rigidity, cranial nerves II through XII intact, DTR's normal and symmetric, generalized weakness without new focal deficits Extremities -  intact peripheral pulses , left antecubital fossa/upper extremity area AV fistula with positive thrill and bruit MSK--right foot/ankle in splint/brace due to trimalleolar fracture Skin - warm, dry  Data Review:    CBC Recent Labs  Lab 12/16/18 1744 12/16/18 2243  WBC 6.8  --   HGB 7.3* 8.6*  HCT 24.7* 28.6*  PLT 155  --   MCV 93.2  --   MCH 27.5  --   MCHC 29.6*  --   RDW 18.5*  --    LYMPHSABS 1.6  --   MONOABS 0.6  --   EOSABS 0.4  --   BASOSABS 0.0  --    ------------------------------------------------------------------------------------------------------------------  Chemistries  Recent Labs  Lab 12/16/18 1744  NA 136  K 3.7  CL 96*  CO2 25  GLUCOSE 188*  BUN 61*  CREATININE 10.74*  CALCIUM 8.7*   ------------------------------------------------------------------------------------------------------------------ estimated creatinine clearance is 10 mL/min (A) (by C-G formula based on SCr of 10.74 mg/dL (H)). ------------------------------------------------------------------------------------------------------------------ No results for input(s): TSH, T4TOTAL, T3FREE, THYROIDAB in the last 72 hours.  Invalid input(s): FREET3   Coagulation profile No results for input(s): INR, PROTIME in the last 168 hours. ------------------------------------------------------------------------------------------------------------------- No results for input(s): DDIMER in the last 72 hours. -------------------------------------------------------------------------------------------------------------------  Cardiac Enzymes No results for input(s): CKMB, TROPONINI, MYOGLOBIN in the last 168 hours.  Invalid input(s): CK ------------------------------------------------------------------------------------------------------------------    Component Value Date/Time   BNP 556.0 (H) 02/25/2018 2019   --------------------------------------------------------------------------------------------------------------  Urinalysis    Component Value Date/Time   COLORURINE YELLOW 11/20/2018 0213   APPEARANCEUR TURBID (A) 11/20/2018 0213   LABSPEC 1.018 11/20/2018 0213   PHURINE 5.0 11/20/2018 0213   GLUCOSEU >=500 (A) 11/20/2018 0213   HGBUR SMALL (A) 11/20/2018 0213   BILIRUBINUR NEGATIVE 11/20/2018 0213   KETONESUR NEGATIVE 11/20/2018 0213   PROTEINUR 100 (A) 11/20/2018 0213    UROBILINOGEN 0.2 03/17/2015 2236   NITRITE NEGATIVE 11/20/2018 0213   LEUKOCYTESUR TRACE (A) 11/20/2018 0213     Imaging Results:    No results found.  Radiological Exams on Admission: No results found.  DVT Prophylaxis -SCD /Heparin AM Labs Ordered, also please review Full Orders  Family Communication: Admission, patients condition and plan of care including tests being ordered have been discussed with the patient who indicate understanding and agree with the plan   Code Status - Full Code  Likely DC to  SNF post-op  Condition   stable  Roxan Hockey M.D on 12/17/2018 at 12:57 AM Go to [email or web address] -  for contact info  [facility] - Office  [phone]

## 2018-12-16 NOTE — Pre-Procedure Instructions (Signed)
    Martha [name]  12/16/2018    [name]'s procedure is scheduled on Friday, 12/17/2018 at 2:50 PM.   Report to [facility] Entrance "A" Admitting Office at 11:50 AM.   Call this number if you have problems the morning of surgery: [phone]   Remember:  Patient is not to eat or drink after midnight tonight.  Take these medicines the morning of surgery with A SIP OF WATER: Buspirone (Buspar), Pantoprazole (Protonix), Paroxetine (Paxil), Alprazolam (Xanax) - if needed, Hydrocodone or Tylenol - if needed, Albuterol nebulizer - if needed.  Patient is not to have her bedtime dose of Admelog Solostar Insulin tonight. In the AM, please give her 1/2 of her regular dose of Lantus - 15 units to be given. Please check patient's blood sugar when she awakens and every 2 hours until she leaves for the hospital. If blood sugar is 70 or below, treat with 1/2 cup of clear juice (apple or cranberry) and recheck blood sugar 15 minutes after drinking juice. If blood sugar continues to be 70 or below, call the Short Stay department and ask to speak to a nurse.  Per instructions Dr. Renold Don, Anesthesiologist/Makaio Mach Stan Head, RN    Do not wear jewelry, make-up or nail polish.  Do not wear lotions, powders, perfumes or deodorant.  Do not shave 48 hours prior to surgery.    Do not bring valuables to the hospital.  [facility] is not responsible for any belongings or valuables.  Please have patient brush her teeth before arriving for surgery.  Contacts, dentures or bridgework may not be worn into surgery.  Leave your suitcase in the car.  After surgery it may be brought to your room.

## 2018-12-16 NOTE — Pre-Procedure Instructions (Signed)
    Martha [name]  12/16/2018    [name]'s procedure is scheduled on Friday, 12/17/2018 at 2:50 PM.   Report to [facility] Entrance "A" Admitting Office at 11:50 AM.   Call this number if you have problems the morning of surgery: [phone]   Remember:  Patient is not to eat or drink after midnight tonight.  Take these medicines the morning of surgery with A SIP OF WATER: Buspirone (Buspar), Pantoprazole (Protonix), Paroxetine (Paxil), Alprazolam (Xanax) - if needed, Hydrocodone or Tylenol - if needed, Albuterol nebulizer - if needed.  Patient is not to have her bedtime dose of Admelog Solostar Insulin tonight. In the AM, please give her 1/2 of her regular dose of Lantus - 15 units to be given. Please check patient's blood sugar when she awakens and every 2 hours until she leaves for the hospital. If blood sugar is 70 or below, treat with 1/2 cup of clear juice (apple or cranberry) and recheck blood sugar 15 minutes after drinking juice. If blood sugar continues to be 70 or below, call the Short Stay department and ask to speak to a nurse.  Per instructions Dr. Renold Don, Anesthesiologist/Giulietta Prokop Stan Head, RN     Do not wear jewelry, make-up or nail polish.  Do not wear lotions, powders, perfumes or deodorant.  Do not shave 48 hours prior to surgery.    Do not bring valuables to the hospital.  [facility] is not responsible for any belongings or valuables.  Please have patient brush her teeth before arriving for surgery.  Contacts, dentures or bridgework may not be worn into surgery.  Leave your suitcase in the car.  After surgery it may be brought to your room.  For patients admitted to the hospital, discharge time will be determined by your treatment team.

## 2018-12-16 NOTE — Telephone Encounter (Signed)
[facility] - Nurse called stating she needed to speak with a nurse concerning pt.

## 2018-12-16 NOTE — Telephone Encounter (Signed)
I called [facility] that [facility] - Dr. Wilson Singer stated they would get pt transported to [facility] ED. Katherine Basset states EMS is there to pick up pt.

## 2018-12-16 NOTE — Telephone Encounter (Signed)
"  I saw Martha [name] message about Martha [name].  What does Dr. Amalia Hailey decided to do about her case on tomorrow?  Is it still on or is he going to cancel it?"  I am not sure at this time Dr. Amalia Hailey has been in surgery all day.  I will call and give you a response, once I hear from him.

## 2018-12-16 NOTE — Anesthesia Preprocedure Evaluation (Addendum)
Anesthesia Evaluation  Patient identified by MRN, date of birth, ID band Patient awake    Reviewed: Allergy & Precautions, NPO status , Patient's Chart, lab work & pertinent test results  Airway Mallampati: II  TM Distance: >3 FB Neck ROM: Full    Dental no notable dental hx.    Pulmonary shortness of breath, COPD, former smoker,    Pulmonary exam normal breath sounds clear to auscultation       Cardiovascular hypertension, Pt. on medications +CHF  Normal cardiovascular exam Rhythm:Regular Rate:Normal     Neuro/Psych PSYCHIATRIC DISORDERS Anxiety Depression Bipolar Disorder  Neuromuscular disease    GI/Hepatic Neg liver ROS, GERD  Medicated and Poorly Controlled,  Endo/Other  diabetes, Type 2Morbid obesity  Renal/GU ARF and CRFRenal disease  negative genitourinary   Musculoskeletal  (+) Arthritis , Osteoarthritis,    Abdominal   Peds negative pediatric ROS (+)  Hematology  (+) Blood dyscrasia, anemia ,   Anesthesia Other Findings   Reproductive/Obstetrics negative OB ROS                            Anesthesia Physical Anesthesia Plan  ASA: IV  Anesthesia Plan: General   Post-op Pain Management:    Induction: Intravenous  PONV Risk Score and Plan: 3 and Ondansetron, Treatment may vary due to age or medical condition and Propofol infusion  Airway Management Planned: Oral ETT  Additional Equipment:   Intra-op Plan:   Post-operative Plan: Extubation in OR  Informed Consent:   Plan Discussed with: CRNA, Anesthesiologist and Surgeon  Anesthesia Plan Comments: (Recently admitted 6/1-9/5 for metabolic encephalopathy 2/2 uremia and trimalleolar fracture of Right ankle. Patient had carpal tunnel surgery 11/25/18, she missed her next two dialysis sessions, fell on 5/4 and suffered ankle fracture.   Chronic anemia of CKD, last Hgb 7.9 on 12/06/18.  Echo 04/07/2017 showed EF 60% to 65%,  normal wall motion, normal valves.)       Anesthesia Quick Evaluation

## 2018-12-16 NOTE — ED Notes (Signed)
Per [facility], pt refused transport for dialysis yesterday and today.

## 2018-12-16 NOTE — Telephone Encounter (Signed)
I informed Dr. Amalia Hailey of [facility]'s update of pt from Katherine Basset, RN at [facility]. Dr. Amalia Hailey states she can not maintain her health status without dialysis and would need to be admitted through ED for dialysis.

## 2018-12-16 NOTE — Progress Notes (Signed)
2:10 PM Mateo Flow, nurse for Dr. Amalia Hailey returned my call and she states she will notify Dr. Amalia Hailey and also the surgical scheduler.  At 3:00 PM, pt still on schedule. I called Delydia, surgical scheduler for Dr. Amalia Hailey and she states Dr. Amalia Hailey is in surgery at [facility] and has not yet talked with him. I asked her to call me back when she finds out if surgery is to be cancelled or if he wants to admit pt prior to surgery to have dialysis.

## 2018-12-16 NOTE — Telephone Encounter (Signed)
[facility] states pt is scheduled for surgery 12/17/2018, pt has refused dialysis for Wednesday and Thursday making last dialysis on Monday, pt also refused to go to pre-op appt. Ms Martha [name] request if surgery is still scheduled.

## 2018-12-16 NOTE — Telephone Encounter (Signed)
I called [facility] - Martha [name] states she spoke to nurse at [facility] and pt has refused dialysis of 4 days, and the surgical PA states pt can not have surgery if she is not taking dialysis, she could be admitted to the hospital for dialysis and then have the surgery. I told Martha [name] I would inform Dr. Amalia Hailey.

## 2018-12-17 ENCOUNTER — Inpatient Hospital Stay (HOSPITAL_COMMUNITY): Payer: Medicaid Other | Admitting: Physician Assistant

## 2018-12-17 ENCOUNTER — Encounter (HOSPITAL_COMMUNITY)
Admission: EM | Disposition: A | Discharge status: Skilled Nursing Facility | Payer: Self-pay | Source: Skilled Nursing Facility | Attending: Internal Medicine

## 2018-12-17 ENCOUNTER — Ambulatory Visit (HOSPITAL_COMMUNITY): Admission: RE | Admit: 2018-12-17 | Payer: Medicaid Other | Source: Home / Self Care | Admitting: Podiatry

## 2018-12-17 ENCOUNTER — Encounter (HOSPITAL_COMMUNITY): Payer: Self-pay

## 2018-12-17 DIAGNOSIS — S82854A Nondisplaced trimalleolar fracture of right lower leg, initial encounter for closed fracture: Secondary | ICD-10-CM

## 2018-12-17 DIAGNOSIS — S82851A Displaced trimalleolar fracture of right lower leg, initial encounter for closed fracture: Secondary | ICD-10-CM | POA: Diagnosis present

## 2018-12-17 DIAGNOSIS — S82851S Displaced trimalleolar fracture of right lower leg, sequela: Secondary | ICD-10-CM

## 2018-12-17 DIAGNOSIS — F418 Other specified anxiety disorders: Secondary | ICD-10-CM

## 2018-12-17 DIAGNOSIS — F319 Bipolar disorder, unspecified: Secondary | ICD-10-CM

## 2018-12-17 HISTORY — PX: ORIF ANKLE FRACTURE: SHX5408

## 2018-12-17 LAB — GLUCOSE, CAPILLARY
Glucose-Capillary: 127 mg/dL — ABNORMAL HIGH (ref 70–99)
Glucose-Capillary: 121 mg/dL — ABNORMAL HIGH (ref 70–99)
Glucose-Capillary: 117 mg/dL — ABNORMAL HIGH (ref 70–99)
Glucose-Capillary: 138 mg/dL — ABNORMAL HIGH (ref 70–99)
Glucose-Capillary: 138 mg/dL — ABNORMAL HIGH (ref 70–99)
Glucose-Capillary: 121 mg/dL — ABNORMAL HIGH (ref 70–99)

## 2018-12-17 LAB — TYPE AND SCREEN
ABO/RH(D): A NEG
ABO/RH(D): A NEG
Antibody Screen: NEGATIVE
Antibody Screen: NEGATIVE
Unit division: 0

## 2018-12-17 LAB — IRON AND TIBC
Iron: 37 ug/dL (ref 28–170)
Saturation Ratios: 21 % (ref 10.4–31.8)
TIBC: 174 ug/dL — ABNORMAL LOW (ref 250–450)
UIBC: 137 ug/dL

## 2018-12-17 LAB — CBC
HCT: 26.6 % — ABNORMAL LOW (ref 36.0–46.0)
Hemoglobin: 8.5 g/dL — ABNORMAL LOW (ref 12.0–15.0)
MCH: 27.9 pg (ref 26.0–34.0)
MCHC: 32 g/dL (ref 30.0–36.0)
MCV: 87.2 fL (ref 80.0–100.0)
Platelets: 135 10*3/uL — ABNORMAL LOW (ref 150–400)
RBC: 3.05 MIL/uL — ABNORMAL LOW (ref 3.87–5.11)
RDW: 17.3 % — ABNORMAL HIGH (ref 11.5–15.5)
WBC: 6.6 10*3/uL (ref 4.0–10.5)
nRBC: 0 % (ref 0.0–0.2)

## 2018-12-17 LAB — BPAM RBC
Blood Product Expiration Date: 202006152359
ISSUE DATE / TIME: 202005212029
Unit Type and Rh: 600

## 2018-12-17 LAB — ABO/RH: ABO/RH(D): A NEG

## 2018-12-17 LAB — BASIC METABOLIC PANEL
Anion gap: 15 (ref 5–15)
BUN: 19 mg/dL (ref 6–20)
CO2: 26 mmol/L (ref 22–32)
Calcium: 9.4 mg/dL (ref 8.9–10.3)
Chloride: 96 mmol/L — ABNORMAL LOW (ref 98–111)
Creatinine, Ser: 5 mg/dL — ABNORMAL HIGH (ref 0.44–1.00)
GFR calc Af Amer: 11 mL/min — ABNORMAL LOW (ref >60)
GFR calc non Af Amer: 9 mL/min — ABNORMAL LOW (ref >60)
Glucose, Bld: 138 mg/dL — ABNORMAL HIGH (ref 70–99)
Potassium: 3.3 mmol/L — ABNORMAL LOW (ref 3.5–5.1)
Sodium: 137 mmol/L (ref 135–145)

## 2018-12-17 LAB — CBG MONITORING, ED: Glucose-Capillary: 139 mg/dL — ABNORMAL HIGH (ref 70–99)

## 2018-12-17 LAB — MRSA PCR SCREENING: MRSA by PCR: NEGATIVE

## 2018-12-17 SURGERY — OPEN REDUCTION INTERNAL FIXATION (ORIF) ANKLE FRACTURE
Anesthesia: General | Laterality: Right

## 2018-12-17 MED ORDER — DOXERCALCIFEROL 4 MCG/2ML IV SOLN
1.0000 ug | INTRAVENOUS | Status: AC
  Administered 2018-12-18: 1 ug via INTRAVENOUS
  Filled 2018-12-17: qty 2

## 2018-12-17 MED ORDER — LIDOCAINE-PRILOCAINE 2.5-2.5 % EX CREA
1.0000 "application " | TOPICAL_CREAM | Freq: Every day | CUTANEOUS | Status: DC | PRN
  Filled 2018-12-17: qty 5

## 2018-12-17 MED ORDER — ENSURE PRE-SURGERY PO LIQD
296.0000 mL | Freq: Once | ORAL | Status: AC
  Administered 2018-12-17: 296 mL via ORAL
  Filled 2018-12-17: qty 296

## 2018-12-17 MED ORDER — ENSURE PRE-SURGERY PO LIQD
296.0000 mL | Freq: Once | ORAL | Status: DC
  Filled 2018-12-17: qty 296

## 2018-12-17 MED ORDER — TORSEMIDE 100 MG PO TABS
100.0000 mg | ORAL_TABLET | Freq: Every day | ORAL | Status: DC
  Administered 2018-12-17: 100 mg via ORAL
  Filled 2018-12-17: qty 1

## 2018-12-17 MED ORDER — CHLORHEXIDINE GLUCONATE CLOTH 2 % EX PADS
6.0000 | MEDICATED_PAD | Freq: Every day | CUTANEOUS | Status: DC
  Administered 2018-12-17 – 2018-12-19 (×3): 6 via TOPICAL

## 2018-12-17 MED ORDER — DARBEPOETIN ALFA 100 MCG/0.5ML IJ SOSY
100.0000 ug | PREFILLED_SYRINGE | INTRAMUSCULAR | Status: DC
  Administered 2018-12-18: 16:00:00 100 ug via INTRAVENOUS

## 2018-12-17 MED ORDER — MIDAZOLAM HCL 2 MG/2ML IJ SOLN
INTRAMUSCULAR | Status: AC
  Filled 2018-12-17: qty 2

## 2018-12-17 MED ORDER — HYDRALAZINE HCL 20 MG/ML IJ SOLN: 10.0000 mg | Freq: Four times a day (QID) | INTRAMUSCULAR | Status: DC | PRN

## 2018-12-17 MED ORDER — BISACODYL 10 MG RE SUPP
10.0000 mg | Freq: Every day | RECTAL | Status: DC | PRN
  Administered 2018-12-17: 10 mg via RECTAL
  Filled 2018-12-17: qty 1

## 2018-12-17 MED ORDER — OXYCODONE HCL 5 MG PO TABS: 5.0000 mg | ORAL_TABLET | Freq: Once | ORAL | Status: DC | PRN

## 2018-12-17 MED ORDER — MEPERIDINE HCL 25 MG/ML IJ SOLN: 6.2500 mg | INTRAMUSCULAR | Status: DC | PRN

## 2018-12-17 MED ORDER — TRAZODONE HCL 50 MG PO TABS
50.0000 mg | ORAL_TABLET | Freq: Every evening | ORAL | Status: DC | PRN
  Filled 2018-12-17: qty 1

## 2018-12-17 MED ORDER — DOXERCALCIFEROL 4 MCG/2ML IV SOLN
1.0000 ug | INTRAVENOUS | Status: DC
  Filled 2018-12-17: qty 2

## 2018-12-17 MED ORDER — EPHEDRINE 5 MG/ML INJ
INTRAVENOUS | Status: AC
  Filled 2018-12-17: qty 20

## 2018-12-17 MED ORDER — PROPOFOL 10 MG/ML IV BOLUS
INTRAVENOUS | Status: DC | PRN
  Administered 2018-12-17: 200 mg via INTRAVENOUS

## 2018-12-17 MED ORDER — ALBUTEROL SULFATE (2.5 MG/3ML) 0.083% IN NEBU
2.5000 mg | INHALATION_SOLUTION | RESPIRATORY_TRACT | Status: DC | PRN
  Administered 2018-12-18: 2.5 mg via RESPIRATORY_TRACT
  Filled 2018-12-17: qty 3

## 2018-12-17 MED ORDER — SODIUM CHLORIDE 0.9 % IV SOLN: INTRAVENOUS | Status: DC

## 2018-12-17 MED ORDER — CALCIUM CARBONATE ANTACID 500 MG PO CHEW
750.0000 mg | CHEWABLE_TABLET | Freq: Three times a day (TID) | ORAL | Status: DC | PRN
  Administered 2018-12-18: 750 mg via ORAL
  Filled 2018-12-17: qty 4

## 2018-12-17 MED ORDER — PROPOFOL 10 MG/ML IV BOLUS
INTRAVENOUS | Status: AC
  Filled 2018-12-17: qty 20

## 2018-12-17 MED ORDER — SODIUM CHLORIDE 0.9 % IV SOLN: 250.0000 mL | INTRAVENOUS | Status: DC | PRN

## 2018-12-17 MED ORDER — LOSARTAN POTASSIUM 25 MG PO TABS
25.0000 mg | ORAL_TABLET | Freq: Every day | ORAL | Status: DC
  Administered 2018-12-17 – 2018-12-19 (×3): 25 mg via ORAL
  Filled 2018-12-17 (×3): qty 1

## 2018-12-17 MED ORDER — OXYCODONE HCL 5 MG/5ML PO SOLN: 5.0000 mg | Freq: Once | ORAL | Status: DC | PRN

## 2018-12-17 MED ORDER — HYDROCODONE-ACETAMINOPHEN 10-325 MG PO TABS
1.0000 | ORAL_TABLET | Freq: Four times a day (QID) | ORAL | Status: DC | PRN
  Administered 2018-12-18 – 2018-12-19 (×5): 1 via ORAL
  Filled 2018-12-17 (×7): qty 1

## 2018-12-17 MED ORDER — SODIUM CHLORIDE 0.9% FLUSH: 3.0000 mL | INTRAVENOUS | Status: DC | PRN

## 2018-12-17 MED ORDER — LIDOCAINE 2% (20 MG/ML) 5 ML SYRINGE
INTRAMUSCULAR | Status: DC | PRN
  Administered 2018-12-17: 100 mg via INTRAVENOUS

## 2018-12-17 MED ORDER — ONDANSETRON HCL 4 MG/2ML IJ SOLN
4.0000 mg | Freq: Four times a day (QID) | INTRAMUSCULAR | Status: DC | PRN
  Administered 2018-12-17: 4 mg via INTRAVENOUS

## 2018-12-17 MED ORDER — ATORVASTATIN CALCIUM 10 MG PO TABS
20.0000 mg | ORAL_TABLET | Freq: Every day | ORAL | Status: DC
  Administered 2018-12-17 – 2018-12-19 (×3): 20 mg via ORAL
  Filled 2018-12-17 (×3): qty 2

## 2018-12-17 MED ORDER — FENTANYL CITRATE (PF) 100 MCG/2ML IJ SOLN
INTRAMUSCULAR | Status: AC
  Filled 2018-12-17: qty 2

## 2018-12-17 MED ORDER — LIDOCAINE HCL 2 % IJ SOLN
INTRAMUSCULAR | Status: AC
  Filled 2018-12-17: qty 20

## 2018-12-17 MED ORDER — EPOETIN ALFA 10000 UNIT/ML IJ SOLN: 10000.0000 [IU] | INTRAMUSCULAR | Status: DC

## 2018-12-17 MED ORDER — FENTANYL CITRATE (PF) 250 MCG/5ML IJ SOLN
INTRAMUSCULAR | Status: DC | PRN
  Administered 2018-12-17: 100 ug via INTRAVENOUS

## 2018-12-17 MED ORDER — BUPIVACAINE-EPINEPHRINE (PF) 0.5% -1:200000 IJ SOLN
INTRAMUSCULAR | Status: DC | PRN
  Administered 2018-12-17: 25 mL via PERINEURAL

## 2018-12-17 MED ORDER — SODIUM CHLORIDE 0.9 % IV SOLN
INTRAVENOUS | Status: DC | PRN
  Administered 2018-12-17: 20 ug/min via INTRAVENOUS

## 2018-12-17 MED ORDER — ONDANSETRON HCL 4 MG/2ML IJ SOLN: 4.0000 mg | Freq: Once | INTRAMUSCULAR | Status: DC | PRN

## 2018-12-17 MED ORDER — SODIUM CHLORIDE 0.9% FLUSH
3.0000 mL | Freq: Two times a day (BID) | INTRAVENOUS | Status: DC
  Administered 2018-12-17 – 2018-12-18 (×3): 3 mL via INTRAVENOUS

## 2018-12-17 MED ORDER — ONDANSETRON HCL 4 MG PO TABS: 4.0000 mg | ORAL_TABLET | Freq: Four times a day (QID) | ORAL | Status: DC | PRN

## 2018-12-17 MED ORDER — ACETAMINOPHEN 650 MG RE SUPP: 650.0000 mg | Freq: Four times a day (QID) | RECTAL | Status: DC | PRN

## 2018-12-17 MED ORDER — ACETAMINOPHEN 325 MG PO TABS: 325.0000 mg | ORAL_TABLET | ORAL | Status: DC | PRN

## 2018-12-17 MED ORDER — COLCHICINE 0.6 MG PO TABS
0.6000 mg | ORAL_TABLET | ORAL | Status: DC
  Administered 2018-12-18: 0.6 mg via ORAL
  Filled 2018-12-17: qty 1

## 2018-12-17 MED ORDER — FENTANYL CITRATE (PF) 100 MCG/2ML IJ SOLN: 25.0000 ug | INTRAMUSCULAR | Status: DC | PRN

## 2018-12-17 MED ORDER — LIDOCAINE 2% (20 MG/ML) 5 ML SYRINGE
INTRAMUSCULAR | Status: AC
  Filled 2018-12-17: qty 5

## 2018-12-17 MED ORDER — EPHEDRINE SULFATE-NACL 50-0.9 MG/10ML-% IV SOSY
PREFILLED_SYRINGE | INTRAVENOUS | Status: DC | PRN
  Administered 2018-12-17: 10 mg via INTRAVENOUS
  Administered 2018-12-17: 20 mg via INTRAVENOUS

## 2018-12-17 MED ORDER — ACETAMINOPHEN 325 MG PO TABS
650.0000 mg | ORAL_TABLET | Freq: Four times a day (QID) | ORAL | Status: DC | PRN
  Administered 2018-12-18: 650 mg via ORAL
  Filled 2018-12-17: qty 2

## 2018-12-17 MED ORDER — CEFAZOLIN SODIUM 1 G IJ SOLR
INTRAMUSCULAR | Status: AC
  Filled 2018-12-17: qty 30

## 2018-12-17 MED ORDER — SODIUM CHLORIDE 0.9% FLUSH
3.0000 mL | Freq: Two times a day (BID) | INTRAVENOUS | Status: DC
  Administered 2018-12-18: 3 mL via INTRAVENOUS

## 2018-12-17 MED ORDER — SUCCINYLCHOLINE CHLORIDE 200 MG/10ML IV SOSY
PREFILLED_SYRINGE | INTRAVENOUS | Status: DC | PRN
  Administered 2018-12-17: 140 mg via INTRAVENOUS

## 2018-12-17 MED ORDER — POLYETHYLENE GLYCOL 3350 17 G PO PACK: 17.0000 g | PACK | Freq: Every day | ORAL | Status: DC | PRN

## 2018-12-17 MED ORDER — 0.9 % SODIUM CHLORIDE (POUR BTL) OPTIME
TOPICAL | Status: DC | PRN
  Administered 2018-12-17: 1000 mL

## 2018-12-17 MED ORDER — ONDANSETRON HCL 4 MG/2ML IJ SOLN
INTRAMUSCULAR | Status: AC
  Filled 2018-12-17: qty 2

## 2018-12-17 MED ORDER — INSULIN GLARGINE 100 UNIT/ML SOLOSTAR PEN: 15.0000 [IU] | PEN_INJECTOR | SUBCUTANEOUS | Status: DC

## 2018-12-17 MED ORDER — INSULIN GLARGINE 100 UNIT/ML ~~LOC~~ SOLN
15.0000 [IU] | Freq: Every day | SUBCUTANEOUS | Status: DC
  Administered 2018-12-17 – 2018-12-19 (×3): 15 [IU] via SUBCUTANEOUS
  Filled 2018-12-17 (×3): qty 0.15

## 2018-12-17 MED ORDER — ACETAMINOPHEN 160 MG/5ML PO SOLN: 325.0000 mg | ORAL | Status: DC | PRN

## 2018-12-17 MED ORDER — CHLORHEXIDINE GLUCONATE 4 % EX LIQD
60.0000 mL | Freq: Once | CUTANEOUS | Status: AC
  Administered 2018-12-17: 4 via TOPICAL

## 2018-12-17 MED ORDER — FENTANYL CITRATE (PF) 250 MCG/5ML IJ SOLN
INTRAMUSCULAR | Status: AC
  Filled 2018-12-17: qty 5

## 2018-12-17 MED ORDER — BUPIVACAINE HCL (PF) 0.5 % IJ SOLN
INTRAMUSCULAR | Status: AC
  Filled 2018-12-17: qty 30

## 2018-12-17 MED ORDER — ALPRAZOLAM 0.25 MG PO TABS
0.2500 mg | ORAL_TABLET | Freq: Three times a day (TID) | ORAL | Status: DC | PRN
  Administered 2018-12-18 (×2): 0.25 mg via ORAL
  Filled 2018-12-17 (×2): qty 1

## 2018-12-17 MED ORDER — BUPIVACAINE LIPOSOME 1.3 % IJ SUSP
INTRAMUSCULAR | Status: DC | PRN
  Administered 2018-12-17: 10 mL via PERINEURAL

## 2018-12-17 MED ORDER — BUPIVACAINE HCL (PF) 0.5 % IJ SOLN
INTRAMUSCULAR | Status: DC | PRN
  Administered 2018-12-17: 20 mL

## 2018-12-17 MED ORDER — PAROXETINE HCL 20 MG PO TABS
20.0000 mg | ORAL_TABLET | Freq: Every day | ORAL | Status: DC
  Administered 2018-12-17 – 2018-12-19 (×3): 20 mg via ORAL
  Filled 2018-12-17 (×3): qty 1

## 2018-12-17 MED ORDER — FENTANYL CITRATE (PF) 100 MCG/2ML IJ SOLN
100.0000 ug | Freq: Once | INTRAMUSCULAR | Status: AC
  Administered 2018-12-17: 50 ug via INTRAVENOUS

## 2018-12-17 MED ORDER — BUSPIRONE HCL 15 MG PO TABS
7.5000 mg | ORAL_TABLET | Freq: Two times a day (BID) | ORAL | Status: DC
  Administered 2018-12-17 – 2018-12-19 (×5): 7.5 mg via ORAL
  Filled 2018-12-17 (×5): qty 1

## 2018-12-17 MED ORDER — INSULIN ASPART 100 UNIT/ML ~~LOC~~ SOLN
0.0000 [IU] | Freq: Three times a day (TID) | SUBCUTANEOUS | Status: DC
  Administered 2018-12-17 (×2): 1 [IU] via SUBCUTANEOUS
  Administered 2018-12-18: 2 [IU] via SUBCUTANEOUS
  Administered 2018-12-18 – 2018-12-19 (×2): 1 [IU] via SUBCUTANEOUS

## 2018-12-17 MED ORDER — SODIUM CHLORIDE 0.9 % IV SOLN
INTRAVENOUS | Status: DC | PRN
  Administered 2018-12-17: 15:00:00 via INTRAVENOUS

## 2018-12-17 MED ORDER — SUCCINYLCHOLINE CHLORIDE 200 MG/10ML IV SOSY
PREFILLED_SYRINGE | INTRAVENOUS | Status: AC
  Filled 2018-12-17: qty 10

## 2018-12-17 MED ORDER — HEPARIN SODIUM (PORCINE) 5000 UNIT/ML IJ SOLN
5000.0000 [IU] | Freq: Three times a day (TID) | INTRAMUSCULAR | Status: DC
  Administered 2018-12-17 – 2018-12-19 (×4): 5000 [IU] via SUBCUTANEOUS
  Filled 2018-12-17 (×3): qty 1

## 2018-12-17 MED ORDER — CINACALCET HCL 30 MG PO TABS
30.0000 mg | ORAL_TABLET | Freq: Every day | ORAL | Status: DC
  Administered 2018-12-17 – 2018-12-19 (×3): 30 mg via ORAL
  Filled 2018-12-17 (×3): qty 1

## 2018-12-17 SURGICAL SUPPLY — 59 items
BANDAGE ACE 4X5 VEL STRL LF (GAUZE/BANDAGES/DRESSINGS) ×4 IMPLANT
BANDAGE ELASTIC 4 VELCRO ST LF (GAUZE/BANDAGES/DRESSINGS) ×2 IMPLANT
BIT DRILL CANN 2.7 (BIT) ×1
BIT DRILL SRG 2.7XCANN AO CPLG (BIT) IMPLANT
BIT DRL SRG 2.7XCANN AO CPLNG (BIT) ×1
BLADE OSCILLATING/SAGITTAL (BLADE)
BLADE SURG 15 STRL LF DISP TIS (BLADE) ×2 IMPLANT
BLADE SURG 15 STRL SS (BLADE) ×2
BLADE SW THK.38XMED NAR THN (BLADE) ×1 IMPLANT
BNDG ESMARK 4X9 LF (GAUZE/BANDAGES/DRESSINGS) ×2 IMPLANT
BNDG GAUZE ELAST 4 BULKY (GAUZE/BANDAGES/DRESSINGS) ×2 IMPLANT
COVER WAND RF STERILE (DRAPES) ×2 IMPLANT
CUFF TOURNIQUET SINGLE 18IN (TOURNIQUET CUFF) ×1 IMPLANT
DRAPE EXTREMITY T 121X128X90 (DISPOSABLE) ×2 IMPLANT
DRAPE INCISE IOBAN 66X45 STRL (DRAPES) ×1 IMPLANT
DRAPE OEC MINIVIEW 54X84 (DRAPES) ×2 IMPLANT
DRAPE U-SHAPE 47X51 STRL (DRAPES) ×2 IMPLANT
DRILL 2.6X122MM WL AO SHAFT (BIT) ×1 IMPLANT
ELECT REM PT RETURN 9FT ADLT (ELECTROSURGICAL) ×2
ELECTRODE REM PT RTRN 9FT ADLT (ELECTROSURGICAL) ×1 IMPLANT
GAUZE SPONGE 4X4 12PLY STRL (GAUZE/BANDAGES/DRESSINGS) ×2 IMPLANT
GAUZE XEROFORM 1X8 LF (GAUZE/BANDAGES/DRESSINGS) ×3 IMPLANT
GLOVE BIO SURGEON STRL SZ8 (GLOVE) ×2 IMPLANT
GLOVE BIOGEL PI IND STRL 8 (GLOVE) IMPLANT
GLOVE BIOGEL PI INDICATOR 8 (GLOVE) ×3
GOWN STRL REUS W/ TWL LRG LVL3 (GOWN DISPOSABLE) ×1 IMPLANT
GOWN STRL REUS W/ TWL XL LVL3 (GOWN DISPOSABLE) ×1 IMPLANT
GOWN STRL REUS W/TWL LRG LVL3 (GOWN DISPOSABLE)
GOWN STRL REUS W/TWL XL LVL3 (GOWN DISPOSABLE) ×2
K-WIRE OLIVE STOP (WIRE) ×4
K-WIRE SMOOTH 1.4X150 (WIRE) ×4
K-WIRE SMOOTH 2.0X150 (WIRE) ×2
KIT BASIN OR (CUSTOM PROCEDURE TRAY) ×2 IMPLANT
KWIRE OLIVE STOP (WIRE) IMPLANT
KWIRE SMOOTH 1.4X150 (WIRE) IMPLANT
KWIRE SMOOTH 2.0X150 (WIRE) IMPLANT
NDL HYPO 25X1 1.5 SAFETY (NEEDLE) ×1 IMPLANT
NDL SAFETY ECLIPSE 18X1.5 (NEEDLE) IMPLANT
NEEDLE HYPO 18GX1.5 SHARP (NEEDLE)
NEEDLE HYPO 25X1 1.5 SAFETY (NEEDLE) ×2 IMPLANT
NS IRRIG 1000ML POUR BTL (IV SOLUTION) IMPLANT
PACK ORTHO EXTREMITY (CUSTOM PROCEDURE TRAY) ×2 IMPLANT
PAD ABD 8X10 STRL (GAUZE/BANDAGES/DRESSINGS) ×1 IMPLANT
PADDING CAST ABS 4INX4YD NS (CAST SUPPLIES)
PADDING CAST ABS COTTON 4X4 ST (CAST SUPPLIES) ×2 IMPLANT
PLATE 8H 108MM (Plate) ×1 IMPLANT
SCREW BONE 14MMX3.5MM (Screw) ×1 IMPLANT
SCREW BONE ANKLE 3.5X14MM (Screw) ×5 IMPLANT
SCREW CANNULATED TI 4.0X44 (Screw) ×1 IMPLANT
SCREW LOCKING 3.5X12 (Screw) ×1 IMPLANT
SCREW LOCKING 3.5X16MM (Screw) ×1 IMPLANT
SCRUB BETADINE 4OZ XXX (MISCELLANEOUS) ×2 IMPLANT
SPLINT FIBERGLASS 4X30 (CAST SUPPLIES) ×1 IMPLANT
STAPLER VISISTAT (STAPLE) IMPLANT
SUT PROLENE 4 0 PS 2 18 (SUTURE) ×1 IMPLANT
SUT VICRYL 4-0 PS2 18IN ABS (SUTURE) IMPLANT
SYR BULB 3OZ (MISCELLANEOUS) ×2 IMPLANT
SYR CONTROL 10ML LL (SYRINGE) ×1 IMPLANT
UNDERPAD 30X30 (UNDERPADS AND DIAPERS) ×2 IMPLANT

## 2018-12-17 NOTE — Anesthesia Procedure Notes (Signed)
Procedure Name: Intubation Date/Time: 12/17/2018 3:14 PM Performed by: Elayne Snare, CRNA Pre-anesthesia Checklist: Patient identified, Emergency Drugs available, Suction available and Patient being monitored Patient Re-evaluated:Patient Re-evaluated prior to induction Oxygen Delivery Method: Circle System Utilized Preoxygenation: Pre-oxygenation with 100% oxygen Induction Type: IV induction and Rapid sequence Laryngoscope Size: Mac and 4 Grade View: Grade I Tube type: Oral Tube size: 7.0 mm Number of attempts: 1 Airway Equipment and Method: Stylet Placement Confirmation: ETT inserted through vocal cords under direct vision,  positive ETCO2 and breath sounds checked- equal and bilateral Secured at: 22 cm Tube secured with: Tape Dental Injury: Teeth and Oropharynx as per pre-operative assessment

## 2018-12-17 NOTE — Anesthesia Procedure Notes (Signed)
Anesthesia Regional Block: Popliteal block   Pre-Anesthetic Checklist: ,, timeout performed, Correct Patient, Correct Site, Correct Laterality, Correct Procedure, Correct Position, site marked, Risks and benefits discussed,  Surgical consent,  Pre-op evaluation,  At surgeon's request and post-op pain management  Laterality: Right  Prep: chloraprep       Needles:  Injection technique: Single-shot  Needle Type: Echogenic Stimulator Needle     Needle Length: 5cm  Needle Gauge: 22     Additional Needles:   Procedures:, nerve stimulator,,, ultrasound used (permanent image in chart),,,,   Nerve Stimulator or Paresthesia:  Response: foot, 0.45 mA,   Additional Responses:   Narrative:  Start time: 12/17/2018 2:50 PM End time: 12/17/2018 2:55 PM Injection made incrementally with aspirations every 5 mL.  Performed by: Personally  Anesthesiologist: Janeece Riggers, MD  Additional Notes: Functioning IV was confirmed and monitors were applied.  A 35mm 22ga Arrow echogenic stimulator needle was used. Sterile prep and drape,hand hygiene and sterile gloves were used. Ultrasound guidance: relevant anatomy identified, needle position confirmed, local anesthetic spread visualized around nerve(s)., vascular puncture avoided.  Image printed for medical record. Negative aspiration and negative test dose prior to incremental administration of local anesthetic. The patient tolerated the procedure well.

## 2018-12-17 NOTE — Consult Note (Signed)
Referring Provider: No ref. provider found Primary Care Physician:  Sinda Du, MD Primary Nephrologist:  Dr. Lowanda Foster Reason for Consultation: Management of end-stage renal disease, evaluation and treatment of anemia, maintenance of euvolemia, treatment and evaluation of secondary hyperparathyroidism.  HPI: This is a 53 year old lady status post right trimalleolar fracture of ankle.  Plan for open reduction internal fixation Dr. Daylene Katayama.  End-stage renal disease hemodialysis usually Monday Wednesday Friday missed dialysis 12/15/2018 last dialysis treatment was 12/16/2018 at [facility].  She has a history of hypertension, diabetes mellitus type 2, diabetic retinopathy, anemia, morbid obesity, depression anxiety and bipolar disorder, congestive heart failure with diastolic dysfunction last 2D echo was 04/07/2017.  Blood pressure 141/67 pulse 76 temperature 98.6 O2 sats 93% CPAP last dialysis treatment 12/16/2018 with 2.5 L removed weight 256 kg  Sodium 137 potassium 3.3 chloride 96 CO2 26 BUN 19 creatinine 5 glucose 138 calcium 9.4 hemoglobin 8.5 WBC 6.6 platelets 135  Lipitor 20 mg daily BuSpar 7.5 mg twice daily cinacalcet 30 mg daily, Hectorol 1 mcg Monday Wednesday Friday, torsemide 100 mg daily, Paxil 20 mg daily, losartan 25 mg daily    Past Medical History:  Diagnosis Date  . Acid reflux    takes Tums  . Anemia   . Arthritis   . Bipolar 1 disorder ([place])   . Blindness of left eye   . Carpal tunnel syndrome, bilateral   . Cervical radiculopathy   . CHF (congestive heart failure) ([place])   . Chronic kidney disease    Stage 5- 01/25/17  . Constipation   . COPD (chronic obstructive pulmonary disease) ([place]) 2014   bronchitis  . Degenerative disc disease, thoracic   . Depression   . Diabetes mellitus    Type II  . Diabetic retinopathy ([place])   . Dyspnea    when walking  . End stage kidney disease ([place])    M, W, F [facility]   . Hypertension   . Noncompliance  with medication regimen   . Noncompliance with medication regimen   . Obesity (BMI 30-39.9)   . OSA (obstructive sleep apnea)    cpap  . Panic attack   . RLS (restless legs syndrome)     Past Surgical History:  Procedure Laterality Date  . AV FISTULA PLACEMENT Right 01/27/2017   Procedure: ARTERIOVENOUS (AV) FISTULA CREATION-RIGHT ARM;  Surgeon: Elam Dutch, MD;  Location: [facility] OR;  Service: Vascular;  Laterality: Right;  . AV FISTULA PLACEMENT Left 08/03/2018   Procedure: LEFT ARTERIOVENOUS (AV) FISTULA CREATION;  Surgeon: Elam Dutch, MD;  Location: [place];  Service: Vascular;  Laterality: Left;  . BIOPSY N/A 05/17/2013   Procedure: BIOPSY;  Surgeon: [name], MD;  Location: [facility];  Service: Endoscopy;  Laterality: N/A;  . CARPAL TUNNEL RELEASE Left 11/25/2018   Procedure: LEFT CARPAL TUNNEL RELEASE;  Surgeon: Charlotte Crumb, MD;  Location: [place];  Service: Orthopedics;  Laterality: Left;  . CHOLECYSTECTOMY N/A 04/27/2014   Procedure: LAPAROSCOPIC CHOLECYSTECTOMY;  Surgeon: [name], MD;  Location: [facility];  Service: General;  Laterality: N/A;  . COLONOSCOPY  06/06/2010   WUJ:WJXBJY bleeding secondary to internal hemorrhoids but incomplete evaluation secondary to poor right colon prep/small rectal and sigmoid colon polyps (hyperplastic). PROPOFOL  . COLONOSCOPY  May 2013   Dr. Gilliam/[facility]: 5 mm a descending colon polyp, hyperplastic. Adequate bowel prep.  . ENDOMETRIAL ABLATION    . 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  ENUCLEATION Left 02/04/2018   Procedure: ENUCLEATION WITH PLACEMENT OF IMPLANT LEFT EYE;  Surgeon: Clista Bernhardt, MD;  Location: [place];  Service: Ophthalmology;  Laterality: Left;  . ESOPHAGOGASTRODUODENOSCOPY  06/06/2010   KGS:UPJSRPR erythema and edema of body of stomach, with sessile polypoid lesions. bx benign. no h.pylori  . ESOPHAGOGASTRODUODENOSCOPY (EGD) WITH PROPOFOL N/A 05/17/2013   Dr.  Oneida Alar: normal esophagus, moderate nodular gastritis, negative path, empiric Savary dilation  . EYE SURGERY  07/2017   sx for glaucoma  . FLEXIBLE SIGMOIDOSCOPY N/A 05/17/2013   Dr. Oneida Alar: moderate sized internal hemorrhoids  . HEMORRHOID BANDING N/A 05/17/2013   Procedure: HEMORRHOID BANDING;  Surgeon: [name], MD;  [facility];  Service: Endoscopy;  Laterality: N/A;  2 bands placed  . INCISION AND DRAINAGE ABSCESS Left 10/11/2013   Procedure: INCISION AND DRAINAGE AND DEBRIDEMENT LEFT BREAST  ABSCESS;  Surgeon: [name], MD;  Location: [facility];  Service: General;  Laterality: Left;  . INSERTION OF DIALYSIS CATHETER N/A 06/08/2018   Procedure: INSERTION OF Right internal jugular TUNNELED  DIALYSIS CATHETER;  Surgeon: Angelia Mould, MD;  Location: [place];  Service: Vascular;  Laterality: N/A;  . IRRIGATION AND DEBRIDEMENT ABSCESS Right 06/01/2013   Procedure: INCISION AND DRAINAGE AND DEBRIDEMENT ABSCESS RIGHT BREAST;  Surgeon: [name], MD;  [facility];  Service: General;  Laterality: Right;  . LEFT EYE REMOVED Left 01/2018   [facility] [address].  Marland Kitchen LIGATION OF ARTERIOVENOUS  FISTULA Right 06/08/2018   Procedure: LIGATION OF ARTERIOVENOUS  FISTULA RIGHT ARM;  Surgeon: Angelia Mould, MD;  Location: [place];  Service: Vascular;  Laterality: Right;  . SAVORY DILATION N/A 05/17/2013   Procedure: SAVORY DILATION;  Surgeon: [name], MD;  [facility];  Service: Endoscopy;  Laterality: N/A;  14/15/16  . SKIN FULL THICKNESS GRAFT Left 05/05/2018   Procedure: ABDOMINAL DERMIS FAT SKIN GRAFT FULL THICKNESS LEFT EYE;  Surgeon: Clista Bernhardt, MD;  Location: [place];  Service: Ophthalmology;  Laterality: Left;  . TUBAL LIGATION      Prior to Admission medications   Medication Sig Start Date End Date Taking? Authorizing Provider  ADMELOG SOLOSTAR 100 UNIT/ML KwikPen Inject 1-8 Units into the skin at bedtime. Sliding  Scale Insulin: 080-200=1 unit, 201-250=3 units, 251-300=5 units, 301-350=6 units, & 351-400=8 units, if greater than 400 notify MD   Yes [provider]  ALPRAZolam (XANAX) 0.5 MG tablet Take 0.5 mg by mouth 4 (four) times daily.    Yes [provider]  atorvastatin (LIPITOR) 20 MG tablet Take 20 mg by mouth daily.    Yes [provider]  bisacodyl (DULCOLAX) 10 MG suppository Place 1 suppository (10 mg total) rectally daily as needed for moderate constipation. 12/03/18  Yes Sinda Du, MD  busPIRone (BUSPAR) 7.5 MG tablet Take 7.5 mg by mouth 2 (two) times daily.   Yes [provider]  calcium carbonate (TUMS EX) 750 MG chewable tablet Chew 2 tablets by mouth See admin instructions. Take 2 tablets by mouth every 8 hours as needed for gerd & Take 2 tablets with each meal for gerd   Yes [provider]  cinacalcet (SENSIPAR) 30 MG tablet Take 30 mg by mouth daily.   Yes [provider]  colchicine 0.6 MG tablet Take 1 tablet (0.6 mg total) by mouth every Tuesday, Thursday, and Saturday at 6 PM. Take 1 tablet every other day Patient taking differently: Take 0.6 mg by  mouth every other day.  09/30/18  Yes Johnson, Clanford L, MD  doxercalciferol (HECTOROL) 4 MCG/2ML injection Inject 0.5 mLs (1 mcg total) into the vein every Monday, Wednesday, and Friday with hemodialysis. 12/03/18  Yes Sinda Du, MD  epoetin alfa (EPOGEN) 10000 UNIT/ML injection Inject 1 mL (10,000 Units total) into the vein 3 (three) times a week. 12/03/18  Yes Sinda Du, MD  HYDROcodone-acetaminophen [facility]) 10-325 MG tablet Take 1 tablet by mouth every 6 (six) hours as needed. Patient taking differently: Take 1 tablet by mouth every 6 (six) hours as needed (pain.).  12/13/18  Yes Edrick Kins, DPM  Insulin Glargine (BASAGLAR KWIKPEN) 100 UNIT/ML SOPN Inject 30 Units into the skin daily.   Yes [provider]  Insulin Glargine (LANTUS SOLOSTAR) 100 UNIT/ML Solostar  Pen Inject 30 Units into the skin every morning. And pen needles 1/day 06/29/18  Yes Renato Shin, MD  lidocaine-prilocaine (EMLA) cream Apply 1 application topically as needed (topical anesthesia for hemodialysis if Gebauers and Lidocaine injection are ineffective.). Patient taking differently: Apply 1 application topically daily as needed (dialysis.).  04/25/17  Yes Johnson, Clanford L, MD  losartan (COZAAR) 25 MG tablet Take 25 mg by mouth daily.   Yes [provider]  ondansetron (ZOFRAN) 4 MG tablet Take 1 tablet (4 mg total) by mouth every 6 (six) hours as needed for nausea. 12/02/18  Yes Sinda Du, MD  PARoxetine (PAXIL) 20 MG tablet Take 1 daily. This is to prevent panic attacks Patient taking differently: Take 20 mg by mouth daily. This is to prevent panic attacks 10/28/16  Yes Sinda Du, MD  polyethylene glycol (MIRALAX / GLYCOLAX) 17 g packet Take 17 g by mouth daily as needed for mild constipation. 12/02/18  Yes Sinda Du, MD  torsemide (DEMADEX) 100 MG tablet Take 100 mg by mouth daily.    Yes [provider]  acetaminophen (TYLENOL) 325 MG tablet Take 2 tablets (650 mg total) by mouth every 6 (six) hours as needed for mild pain (or Fever >/= 101). Patient not taking: Reported on 12/14/2018 12/02/18   Sinda Du, MD  ALPRAZolam Duanne Moron) 1 MG tablet Take 0.5 tablets (0.5 mg total) by mouth 4 (four) times daily. Patient not taking: Reported on 12/16/2018 12/02/18   Sinda Du, MD  diphenhydrAMINE (BENADRYL) 25 mg capsule Take 1 capsule (25 mg total) by mouth every 6 (six) hours as needed for itching. Patient not taking: Reported on 12/14/2018 12/03/18   Sinda Du, MD  insulin aspart (NOVOLOG) 100 UNIT/ML injection Inject 0-9 Units into the skin 3 (three) times daily with meals. Patient not taking: Reported on 12/14/2018 12/02/18   Sinda Du, MD    Current Facility-Administered Medications  Medication Dose Route Frequency Provider Last Rate Last  Dose  . 0.9 %  sodium chloride infusion  100 mL Intravenous PRN Emokpae, Courage, MD      . 0.9 %  sodium chloride infusion  100 mL Intravenous PRN Emokpae, Courage, MD      . 0.9 %  sodium chloride infusion  250 mL Intravenous PRN Emokpae, Courage, MD      . acetaminophen (TYLENOL) tablet 650 mg  650 mg Oral Q6H PRN Emokpae, Courage, MD       Or  . acetaminophen (TYLENOL) suppository 650 mg  650 mg Rectal Q6H PRN Emokpae, Courage, MD      . albuterol (PROVENTIL) (2.5 MG/3ML) 0.083% nebulizer solution 2.5 mg  2.5 mg Nebulization Q2H PRN Roxan Hockey, MD      .  ALPRAZolam (XANAX) tablet 0.25 mg  0.25 mg Oral TID PRN Emokpae, Courage, MD      . alteplase (CATHFLO ACTIVASE) injection 2 mg  2 mg Intracatheter Once PRN Emokpae, Courage, MD      . atorvastatin (LIPITOR) tablet 20 mg  20 mg Oral Daily Emokpae, Courage, MD   20 mg at 12/17/18 1018  . bisacodyl (DULCOLAX) suppository 10 mg  10 mg Rectal Daily PRN Emokpae, Courage, MD      . busPIRone (BUSPAR) tablet 7.5 mg  7.5 mg Oral BID Emokpae, Courage, MD   7.5 mg at 12/17/18 1018  . calcium carbonate (TUMS - dosed in mg elemental calcium) chewable tablet 750 mg  750 mg Oral Q8H PRN Emokpae, Courage, MD      . Chlorhexidine Gluconate Cloth 2 % PADS 6 each  6 each Topical Q0600 Emokpae, Courage, MD      . cinacalcet (SENSIPAR) tablet 30 mg  30 mg Oral Q breakfast Emokpae, Courage, MD   30 mg at 12/17/18 1019  . [START ON 12/18/2018] colchicine tablet 0.6 mg  0.6 mg Oral Q T,Th,Sat-1800 Emokpae, Courage, MD      . doxercalciferol (HECTOROL) injection 1 mcg  1 mcg Intravenous Q M,W,F-HD Emokpae, Courage, MD      . heparin injection 3,000 Units  3,000 Units Dialysis PRN Denton Brick, Courage, MD      . heparin injection 5,000 Units  5,000 Units Subcutaneous Q8H Emokpae, Courage, MD      . heparin injection 6,300 Units  40 Units/kg Dialysis PRN Roxan Hockey, MD   6,300 Units at 12/16/18 1725  . hydrALAZINE (APRESOLINE) injection 10 mg  10 mg  Intravenous Q6H PRN Emokpae, Courage, MD      . HYDROcodone-acetaminophen (NORCO) 10-325 MG per tablet 1 tablet  1 tablet Oral Q6H PRN Emokpae, Courage, MD      . insulin aspart (novoLOG) injection 0-9 Units  0-9 Units Subcutaneous TID WC Emokpae, Courage, MD   1 Units at 12/17/18 0800  . insulin glargine (LANTUS) injection 15 Units  15 Units Subcutaneous Daily Roxan Hockey, MD   15 Units at 12/17/18 1021  . lidocaine-prilocaine (EMLA) cream 1 application  1 application Topical Daily PRN Emokpae, Courage, MD      . losartan (COZAAR) tablet 25 mg  25 mg Oral Daily Emokpae, Courage, MD   25 mg at 12/17/18 1018  . ondansetron (ZOFRAN) tablet 4 mg  4 mg Oral Q6H PRN Emokpae, Courage, MD       Or  . ondansetron (ZOFRAN) injection 4 mg  4 mg Intravenous Q6H PRN Emokpae, Courage, MD      . PARoxetine (PAXIL) tablet 20 mg  20 mg Oral Daily Emokpae, Courage, MD   20 mg at 12/17/18 1018  . polyethylene glycol (MIRALAX / GLYCOLAX) packet 17 g  17 g Oral Daily PRN Emokpae, Courage, MD      . sodium chloride flush (NS) 0.9 % injection 3 mL  3 mL Intravenous Q12H Emokpae, Courage, MD   3 mL at 12/17/18 1020  . sodium chloride flush (NS) 0.9 % injection 3 mL  3 mL Intravenous PRN Emokpae, Courage, MD      . torsemide (DEMADEX) tablet 100 mg  100 mg Oral Daily Emokpae, Courage, MD   100 mg at 12/17/18 1020  . traZODone (DESYREL) tablet 50 mg  50 mg Oral QHS PRN Roxan Hockey, MD       Facility-Administered Medications Ordered in Other Encounters  Medication Dose Route Frequency  Provider Last Rate Last Dose  . ceFAZolin (ANCEF) 3 g in dextrose 5 % 50 mL IVPB  3 g Intravenous To Cheral Bay, DPM        Allergies as of 12/16/2018 - Review Complete 12/16/2018  Allergen Reaction Noted  . Codeine Nausea And Vomiting   . Tape Rash and Other (See Comments) 04/24/2017    Family History  Adopted: Yes  Family history unknown: Yes    Social History   Socioeconomic History  . Marital status:  Significant Other    Spouse name: Not on file  . Number of children: 2  . Years of education: Not on file  . Highest education level: Not on file  Occupational History  . Occupation: Disabled  Social Needs  . Financial resource strain: Not on file  . Food insecurity:    Worry: Not on file    Inability: Not on file  . Transportation needs:    Medical: Not on file    Non-medical: Not on file  Tobacco Use  . Smoking status: Former Smoker    Packs/day: 1.50    Years: 25.00    Pack years: 37.50    Types: Cigarettes    Last attempt to quit: 10/23/2007    Years since quitting: 11.1  . Smokeless tobacco: Never Used  Substance and Sexual Activity  . Alcohol use: No  . Drug use: No  . Sexual activity: Yes    Birth control/protection: Surgical  Lifestyle  . Physical activity:    Days per week: Not on file    Minutes per session: Not on file  . Stress: Not on file  Relationships  . Social connections:    Talks on phone: Not on file    Gets together: Not on file    Attends religious service: Not on file    Active member of club or organization: Not on file    Attends meetings of clubs or organizations: Not on file    Relationship status: Not on file  . Intimate partner violence:    Fear of current or ex partner: Not on file    Emotionally abused: Not on file    Physically abused: Not on file    Forced sexual activity: Not on file  Other Topics Concern  . Not on file  Social History Narrative   Lives with fiance.      Review of Systems: Gen: Fatigue and weakness no fever sweats or chills HEENT: History of retinopathy with enucleation of left eye CV: Denies chest pain, angina, palpitations, syncope, orthopnea, PND, peripheral edema, and claudication. Resp: Denies dyspnea at rest, dyspnea with exercise, cough, sputum, wheezing, coughing up blood, and pleurisy.  History of obstructive sleep apnea BiPAP GI: Denies vomiting blood, jaundice, and fecal incontinence.   Denies  dysphagia or odynophagia.  Morbid obesity GU : Denies urinary burning, blood in urine, urinary frequency, urinary hesitancy, nocturnal urination, and urinary incontinence.  No renal calculi. MS: Trimalleolar fracture ankle Derm: Denies rash, itching, dry skin, hives, moles, warts, or unhealing ulcers.  Psych: History of bipolar disorder and anxiety and depression Heme: Denies bruising, bleeding, and enlarged lymph nodes. Neuro: No headache.  No diplopia. No dysarthria.  No dysphasia.  No history of CVA.  No Seizures. No paresthesias.  No weakness. Endocrine history of diabetes no Thyroid disease.  No Adrenal disease.  Physical Exam: Vital signs in last 24 hours: Temp:  [97.9 F (36.6 C)-98.9 F (37.2 C)] 98.9 F (37.2 C) (05/22  0535) Pulse Rate:  [61-85] 76 (05/22 0535) Resp:  [16-20] 20 (05/22 0535) BP: (108-141)/(57-102) 141/67 (05/22 0535) SpO2:  [93 %-100 %] 93 % (05/22 0535) Weight:  [156.5 kg] 156.5 kg (05/21 1509)   General:   Obese alert nondistressed Head:  Normocephalic and atraumatic. Eyes: Eyes firmly not opening Ears:  Normal auditory acuity. Nose:  No deformity, discharge,  or lesions. Mouth:  No deformity or lesions, dentition normal. Neck:  Supple; no masses or thyromegaly. JVP not elevated Lungs:  Clear throughout to auscultation.   No wheezes, crackles, or rhonchi. No acute distress. Heart:  Regular rate and rhythm; no murmurs, clicks, rubs,  or gallops. Abdomen:  Soft, nontender and nondistended. No masses, hepatosplenomegaly or hernias noted. Normal bowel sounds, without guarding, and without rebound.   Msk:  Symmetrical without gross deformities. Normal posture. Pulses:  No carotid, renal, femoral bruits. DP and PT symmetrical and equal Extremities:  Without clubbing or edema. Neurologic:  Alert and  oriented x4;  grossly normal neurologically. Skin:  Intact without significant lesions or rashes. Cervical Nodes:  No significant cervical adenopathy. Psych:   Alert and cooperative. Normal mood and affect.  Intake/Output from previous day: 05/21 0701 - 05/22 0700 In: 403.2 [I.V.:88.2; Blood:315] Out: 2585  Intake/Output this shift: No intake/output data recorded.  Lab Results: Recent Labs    12/16/18 1744 12/16/18 2243 12/17/18 0221  WBC 6.8  --  6.6  HGB 7.3* 8.6* 8.5*  HCT 24.7* 28.6* 26.6*  PLT 155  --  135*   BMET Recent Labs    12/16/18 1744 12/17/18 0221  NA 136 137  K 3.7 3.3*  CL 96* 96*  CO2 25 26  GLUCOSE 188* 138*  BUN 61* 19  CREATININE 10.74* 5.00*  CALCIUM 8.7* 9.4   LFT No results for input(s): PROT, ALBUMIN, AST, ALT, ALKPHOS, BILITOT, BILIDIR, IBILI in the last 72 hours. PT/INR No results for input(s): LABPROT, INR in the last 72 hours. Hepatitis Panel No results for input(s): HEPBSAG, HCVAB, HEPAIGM, HEPBIGM in the last 72 hours.  Studies/Results: No results found.  Assessment/Plan:  End-stage renal disease usually Monday Wednesday Friday dialysis is received dialysis on 12/16/2018 will dialyze patient again on 12/18/2018.  She has patient at [facility] and is followed with Dr. Lowanda Foster she has a history of dialysis catheter right IJ.  Hypertension/volume incredibly difficult to assess due to body habitus.  Received dialysis with removal of 2 L 12/16/2018  Bones continues on Hectorol and Sensipar.  We will continue to follow calcium and phosphorus  Anemia we will check iron studies and administer darbepoetin.  Status post fracture appreciate assistance from orthopedic surgery  Diabetes mellitus per primary team  Struct of sleep apnea per primary team  Hyperlipidemia continues on statin  Patient using torsemide as a diuretic though she is end-stage renal disease will discontinue her torsemide at this point.   LOS: English @TODAY @10 :26 AM

## 2018-12-17 NOTE — Interval H&P Note (Signed)
History and Physical Interval Note:  12/17/2018 3:08 PM  Martha [name]  has presented today for surgery, with the diagnosis of FRACTURE OF ANKLE.  The various methods of treatment have been discussed with the patient and family. After consideration of risks, benefits and other options for treatment, the patient has consented to  Procedure(s): OPEN REDUCTION INTERNAL FIXATION (ORIF) ANKLE FRACTURE (Right) as a surgical intervention.  The patient's history has been reviewed, patient examined, no change in status, stable for surgery.  I have reviewed the patient's chart and labs.  Questions were answered to the patient's satisfaction.     Edrick Kins

## 2018-12-17 NOTE — Anesthesia Postprocedure Evaluation (Signed)
Anesthesia Post Note  Patient: Martha [name]  Procedure(s) Performed: OPEN REDUCTION INTERNAL FIXATION (ORIF) ANKLE FRACTURE (Right )     Patient location during evaluation: PACU Anesthesia Type: General Level of consciousness: sedated Pain management: pain level controlled Vital Signs Assessment: post-procedure vital signs reviewed and stable Respiratory status: spontaneous breathing and respiratory function stable Cardiovascular status: stable Postop Assessment: no apparent nausea or vomiting Anesthetic complications: no    Last Vitals:  Vitals:   12/17/18 1730 12/17/18 1745  BP: (!) 116/57 (!) 119/56  Pulse: 79 78  Resp: 14 14  Temp: (!) 36.1 C   SpO2: 90% 94%    Last Pain:  Vitals:   12/17/18 1730  TempSrc:   PainSc: 0-No pain                 Sanja Elizardo DANIEL

## 2018-12-17 NOTE — Progress Notes (Signed)
Renal Navigator notified OP HD clinic/[facility] in [place] of patient's admission for surgery and negative COVID rapid test result to provide continuity of care and safety. Renal Navigator will alert OP HD clinic of patient's discharge in order to assist with smooth transition from hospital back to OP HD clinic when medically ready.  Alphonzo Cruise, [place] Renal Navigator [phone]

## 2018-12-17 NOTE — Progress Notes (Signed)
PROGRESS NOTE    Martha [name]  JSE:[id number] DOB: 1966-04-22 DOA: 12/16/2018 PCP: Sinda Du, MD    Brief Narrative:  52 y.o. female with past medical history relevant for morbid obesity, OSA on CPAP, ESRD with HD usually on Mondays Wednesdays and Fridays, bipolar disorder and depression, history of medication lifestyle noncompliance, diabetes and hypertension, as well as recent right Ankle trimalleolar fracture  (Rt trimalleolar fracture of the ankle--- DOI 11/29/2018, for ORIF by Dr. Daylene Katayama at [place] on 12/17/2018----transfer to [place]... Further management per podiatric team  Patient apparently has been staying at [facility] in [place]... Missed hemodialysis on 12/15/2018, scheduled for ORIF at [place] for right trimalleolar ankle fracture on 12/17/2018...  Presented to [facility] on 12/16/2018--- received hemodialysis on 12/16/2018.... Now being transferred to [place] for previously scheduled orthopedic/podiatric surgery  Denies chest pains, palpitations, no fevers, no chills,  No URI symptoms, no UTI symptoms  COVID 19 test is Negative  Assessment & Plan:   Principal Problem:   Trimalleolar fracture of ankle, closed, right, initial encounter Active Problems:   Morbid obesity ([place])   Depression with anxiety   Essential hypertension   Sleep apnea   Anemia   Bipolar 1 disorder (HCC)   Noncompliance with medication regimen   Dialysis patient ([place])   ESRD (end stage renal disease) on dialysis [facility])   History of enucleation of left eyeball   Pain and swelling of ankle, right  1)Rt trimalleolar fracture of the ankle--- DOI 11/29/2018, for ORIF by Dr. Daylene Katayama at [place] on 12/17/2018 -Called and discussed case with Dr. Amalia Hailey. Confirmed surgery for today  2)ESRD--- usually gets HD on Mondays Wednesdays and Fridays, patient missed HD on 12/15/2018 -Pt underwent HD 12/16/2018 at [facility] prior to  transfer to [facility] -continue torsemide 100 mg daily,  3)HTN -Presently appears stable -Continued on losartan 25 mg daily,  4) depression/anxiety/bipolar disorder----this impacts patient's ability to be compliant, continue Paxil 20 mg daily, Xanax and buspirone -Tired this AM, however seems stable  5)DM2-A1c was 8.8 about 6 months ago reflecting uncontrolled diabetes, decrease Lantus to use insulin to 15 units daily from 30 units as patient will be n.p.o. for right ankle surgery.  -Allowing for some permissive Hyperglycemia rather than risk life-threatening hypoglycemia in a patient with unreliable oral intake. -Continue SSI coverage  6) chronic anemia of CKD -hemoglobin currently stable at 8.5,  -would cont EPO agent as per nephrology team  7) morbid obesity -BMI over 50 -Recommend diet/lifestyle modification  8)OSA -compliance with CPAP emphasized -Seen this AM with pt wearing CPAP  DVT prophylaxis: heparin subq Code Status: Full Family Communication: Pt in room, family not at bedside Disposition Plan: Uncertain at this time  Consultants:   Podiatry  Nephrology  Procedures:     Antimicrobials: Anti-infectives (From admission, onward)   None       Subjective: Tired this AM  Objective: Vitals:   12/17/18 0000 12/17/18 0158 12/17/18 0300 12/17/18 0535  BP: (!) 123/57 (!) 133/102  (!) 141/67  Pulse: 73 82 80 76  Resp:  19 16 20   Temp:  98.4 F (36.9 C)  98.9 F (37.2 C)  TempSrc:  Oral  Oral  SpO2: 100% 96% 97% 93%  Weight:      Height:        Intake/Output Summary (Last 24 hours) at 12/17/2018 1127 Last data filed at 12/17/2018 0830 Gross per 24 hour  Intake  403.17 ml  Output 2585 ml  Net -2181.83 ml   Filed Weights   12/16/18 1509  Weight: (!) 156.5 kg    Examination:  General exam: Appears calm and comfortable  Respiratory system: Clear to auscultation. Respiratory effort normal. Cardiovascular system: S1 & S2 heard, RRR  Gastrointestinal system: Morbidly obese, soft, nondistended Central nervous system: Alert and oriented. No focal neurological deficits. Extremities: Symmetric 5 x 5 power. Skin: No rashes, lesions Psychiatry: Judgement and insight appear normal. Mood & affect appropriate.   Data Reviewed: I have personally reviewed following labs and imaging studies  CBC: Recent Labs  Lab 12/16/18 1744 12/16/18 2243 12/17/18 0221  WBC 6.8  --  6.6  NEUTROABS 4.1  --   --   HGB 7.3* 8.6* 8.5*  HCT 24.7* 28.6* 26.6*  MCV 93.2  --  87.2  PLT 155  --  789*   Basic Metabolic Panel: Recent Labs  Lab 12/16/18 1744 12/17/18 0221  NA 136 137  K 3.7 3.3*  CL 96* 96*  CO2 25 26  GLUCOSE 188* 138*  BUN 61* 19  CREATININE 10.74* 5.00*  CALCIUM 8.7* 9.4   GFR: Estimated Creatinine Clearance: 21.4 mL/min (A) (by C-G formula based on SCr of 5 mg/dL (H)). Liver Function Tests: No results for input(s): AST, ALT, ALKPHOS, BILITOT, PROT, ALBUMIN in the last 168 hours. No results for input(s): LIPASE, AMYLASE in the last 168 hours. No results for input(s): AMMONIA in the last 168 hours. Coagulation Profile: No results for input(s): INR, PROTIME in the last 168 hours. Cardiac Enzymes: No results for input(s): CKTOTAL, CKMB, CKMBINDEX, TROPONINI in the last 168 hours. BNP (last 3 results) No results for input(s): PROBNP in the last 8760 hours. HbA1C: No results for input(s): HGBA1C in the last 72 hours. CBG: Recent Labs  Lab 12/17/18 0056 12/17/18 1014  GLUCAP 139* 127*   Lipid Profile: No results for input(s): CHOL, HDL, LDLCALC, TRIG, CHOLHDL, LDLDIRECT in the last 72 hours. Thyroid Function Tests: No results for input(s): TSH, T4TOTAL, FREET4, T3FREE, THYROIDAB in the last 72 hours. Anemia Panel: No results for input(s): VITAMINB12, FOLATE, FERRITIN, TIBC, IRON, RETICCTPCT in the last 72 hours. Sepsis Labs: No results for input(s): PROCALCITON, LATICACIDVEN in the last 168 hours.  Recent  Results (from the past 240 hour(s))  SARS Coronavirus 2 (CEPHEID - Performed in [place] hospital lab), Hosp Order     Status: None   Collection Time: 12/16/18  9:00 PM  Result Value Ref Range Status   SARS Coronavirus 2 NEGATIVE NEGATIVE Final    Comment: (NOTE) If result is NEGATIVE SARS-CoV-2 target nucleic acids are NOT DETECTED. The SARS-CoV-2 RNA is generally detectable in upper and lower  respiratory specimens during the acute phase of infection. The lowest  concentration of SARS-CoV-2 viral copies this assay can detect is 250  copies / mL. A negative result does not preclude SARS-CoV-2 infection  and should not be used as the sole basis for treatment or other  patient management decisions.  A negative result may occur with  improper specimen collection / handling, submission of specimen other  than nasopharyngeal swab, presence of viral mutation(s) within the  areas targeted by this assay, and inadequate number of viral copies  (<250 copies / mL). A negative result must be combined with clinical  observations, patient history, and epidemiological information. If result is POSITIVE SARS-CoV-2 target nucleic acids are DETECTED. The SARS-CoV-2 RNA is generally detectable in upper and lower  respiratory specimens dur  ing the acute phase of infection.  Positive  results are indicative of active infection with SARS-CoV-2.  Clinical  correlation with patient history and other diagnostic information is  necessary to determine patient infection status.  Positive results do  not rule out bacterial infection or co-infection with other viruses. If result is PRESUMPTIVE POSTIVE SARS-CoV-2 nucleic acids MAY BE PRESENT.   A presumptive positive result was obtained on the submitted specimen  and confirmed on repeat testing.  While 2019 novel coronavirus  (SARS-CoV-2) nucleic acids may be present in the submitted sample  additional confirmatory testing may be necessary for epidemiological   and / or clinical management purposes  to differentiate between  SARS-CoV-2 and other Sarbecovirus currently known to infect humans.  If clinically indicated additional testing with an alternate test  methodology [phone]) is advised. The SARS-CoV-2 RNA is generally  detectable in upper and lower respiratory sp ecimens during the acute  phase of infection. The expected result is Negative. Fact Sheet for Patients:  StrictlyIdeas.no Fact Sheet for Healthcare Providers: BankingDealers.co.za This test is not yet approved or cleared by the [place] FDA and has been authorized for detection and/or diagnosis of SARS-CoV-2 by FDA under an Emergency Use Authorization (EUA).  This EUA will remain in effect (meaning this test can be used) for the duration of the COVID-19 declaration under Section 564(b)(1) of the Act, 21 U.S.C. section 360bbb-3(b)(1), unless the authorization is terminated or revoked sooner. Performed at [facility], [address] [place], [place] [address]   MRSA PCR Screening     Status: None   Collection Time: 12/17/18  2:11 AM  Result Value Ref Range Status   MRSA by PCR NEGATIVE NEGATIVE Final    Comment:        The GeneXpert MRSA Assay (FDA approved for NASAL specimens only), is one component of a comprehensive MRSA colonization surveillance program. It is not intended to diagnose MRSA infection nor to guide or monitor treatment for MRSA infections. Performed at [facility], [place] [address] [place], [place] [address]      Radiology Studies: No results found.  Scheduled Meds: . atorvastatin  20 mg Oral Daily  . busPIRone  7.5 mg Oral BID  . Chlorhexidine Gluconate Cloth  6 each Topical Q0600  . cinacalcet  30 mg Oral Q breakfast  . [START ON 12/18/2018] colchicine  0.6 mg Oral Q T,Th,Sat-1800  . [START ON 12/18/2018] darbepoetin (ARANESP) injection - DIALYSIS  100 mcg Intravenous Q Sat-HD  .  doxercalciferol  1 mcg Intravenous Q M,W,F-HD  . heparin  5,000 Units Subcutaneous Q8H  . insulin aspart  0-9 Units Subcutaneous TID WC  . insulin glargine  15 Units Subcutaneous Daily  . losartan  25 mg Oral Daily  . PARoxetine  20 mg Oral Daily  . sodium chloride flush  3 mL Intravenous Q12H   Continuous Infusions: . sodium chloride    . sodium chloride    . sodium chloride       LOS: 1 day   Marylu Lund, MD [facility] Pager On Amion  If 7PM-7AM, please contact night-coverage 12/17/2018, 11:27 AM

## 2018-12-17 NOTE — Progress Notes (Signed)
Pt currently wearing CPAP. RT will continue to monitor.

## 2018-12-17 NOTE — Transfer of Care (Signed)
Immediate Anesthesia Transfer of Care Note  Patient: Martha [name]  Procedure(s) Performed: OPEN REDUCTION INTERNAL FIXATION (ORIF) ANKLE FRACTURE (Right )  Patient Location: PACU  Anesthesia Type:General  Level of Consciousness: awake, alert  and patient cooperative  Airway & Oxygen Therapy: Patient Spontanous Breathing and Patient connected to face mask oxygen  Post-op Assessment: Report given to RN and Post -op Vital signs reviewed and stable  Post vital signs: Reviewed and stable  Last Vitals:  Vitals Value Taken Time  BP 122/63 12/17/2018  5:02 PM  Temp    Pulse 82 12/17/2018  5:03 PM  Resp 17 12/17/2018  5:03 PM  SpO2 95 % 12/17/2018  5:03 PM  Vitals shown include unvalidated device data.  Last Pain:  Vitals:   12/17/18 1102  TempSrc:   PainSc: Asleep      Patients Stated Pain Goal: 2 (71/99/41 2904)  Complications: No apparent anesthesia complications

## 2018-12-17 NOTE — Brief Op Note (Signed)
12/17/2018  4:53 PM  PATIENT:  Martha [name]  53 y.o. female  PRE-OPERATIVE DIAGNOSIS:  FRACTURE OF ANKLE  POST-OPERATIVE DIAGNOSIS:  FRACTURE OF ANKLE  PROCEDURE:  Procedure(s): OPEN REDUCTION INTERNAL FIXATION (ORIF) ANKLE FRACTURE (Right)  SURGEON:  Surgeon(s) and Role:    Edrick Kins, DPM - Primary  PHYSICIAN ASSISTANT: none  ASSISTANTS: none   ANESTHESIA:   regional  EBL:  30 mL   BLOOD ADMINISTERED:none  DRAINS: none   LOCAL MEDICATIONS USED:  49mL MARCAINE 0.5% plain     SPECIMEN:  No Specimen  DISPOSITION OF SPECIMEN:  N/A  COUNTS:  YES  TOURNIQUET:   Total Tourniquet Time Documented: Calf (Right) - 77 minutes Total: Calf (Right) - 77 minutes   DICTATION: .Viviann Spare Dictation  PLAN OF CARE: Admit to inpatient   PATIENT DISPOSITION:  PACU - hemodynamically stable.   Delay start of Pharmacological VTE agent (>24hrs) due to surgical blood loss or risk of bleeding: no  Edrick Kins, DPM [facility]  Dr. Edrick Kins, DPM    [address], [place] [address]                Office [phone]  Fax [phone]

## 2018-12-17 NOTE — H&P (View-Only) (Signed)
PROGRESS NOTE    Martha [name]  IEP:[id number] DOB: 1965/10/31 DOA: 12/16/2018 PCP: Sinda Du, MD    Brief Narrative:  53 y.o. female with past medical history relevant for morbid obesity, OSA on CPAP, ESRD with HD usually on Mondays Wednesdays and Fridays, bipolar disorder and depression, history of medication lifestyle noncompliance, diabetes and hypertension, as well as recent right Ankle trimalleolar fracture  (Rt trimalleolar fracture of the ankle--- DOI 11/29/2018, for ORIF by Dr. Daylene Katayama at [place] on 12/17/2018----transfer to [place]... Further management per podiatric team  Patient apparently has been staying at [facility] in [place]... Missed hemodialysis on 12/15/2018, scheduled for ORIF at [place] for right trimalleolar ankle fracture on 12/17/2018...  Presented to [facility] on 12/16/2018--- received hemodialysis on 12/16/2018.... Now being transferred to [place] for previously scheduled orthopedic/podiatric surgery  Denies chest pains, palpitations, no fevers, no chills,  No URI symptoms, no UTI symptoms  COVID 19 test is Negative  Assessment & Plan:   Principal Problem:   Trimalleolar fracture of ankle, closed, right, initial encounter Active Problems:   Morbid obesity ([place])   Depression with anxiety   Essential hypertension   Sleep apnea   Anemia   Bipolar 1 disorder (HCC)   Noncompliance with medication regimen   Dialysis patient ([place])   ESRD (end stage renal disease) on dialysis [facility])   History of enucleation of left eyeball   Pain and swelling of ankle, right  1)Rt trimalleolar fracture of the ankle--- DOI 11/29/2018, for ORIF by Dr. Daylene Katayama at [place] on 12/17/2018 -Called and discussed case with Dr. Amalia Hailey. Confirmed surgery for today  2)ESRD--- usually gets HD on Mondays Wednesdays and Fridays, patient missed HD on 12/15/2018 -Pt underwent HD 12/16/2018 at [facility] prior to  transfer to [facility] -continue torsemide 100 mg daily,  3)HTN -Presently appears stable -Continued on losartan 25 mg daily,  4) depression/anxiety/bipolar disorder----this impacts patient's ability to be compliant, continue Paxil 20 mg daily, Xanax and buspirone -Tired this AM, however seems stable  5)DM2-A1c was 8.8 about 6 months ago reflecting uncontrolled diabetes, decrease Lantus to use insulin to 15 units daily from 30 units as patient will be n.p.o. for right ankle surgery.  -Allowing for some permissive Hyperglycemia rather than risk life-threatening hypoglycemia in a patient with unreliable oral intake. -Continue SSI coverage  6) chronic anemia of CKD -hemoglobin currently stable at 8.5,  -would cont EPO agent as per nephrology team  7) morbid obesity -BMI over 50 -Recommend diet/lifestyle modification  8)OSA -compliance with CPAP emphasized -Seen this AM with pt wearing CPAP  DVT prophylaxis: heparin subq Code Status: Full Family Communication: Pt in room, family not at bedside Disposition Plan: Uncertain at this time  Consultants:   Podiatry  Nephrology  Procedures:     Antimicrobials: Anti-infectives (From admission, onward)   None       Subjective: Tired this AM  Objective: Vitals:   12/17/18 0000 12/17/18 0158 12/17/18 0300 12/17/18 0535  BP: (!) 123/57 (!) 133/102  (!) 141/67  Pulse: 73 82 80 76  Resp:  19 16 20   Temp:  98.4 F (36.9 C)  98.9 F (37.2 C)  TempSrc:  Oral  Oral  SpO2: 100% 96% 97% 93%  Weight:      Height:        Intake/Output Summary (Last 24 hours) at 12/17/2018 1127 Last data filed at 12/17/2018 0830 Gross per 24 hour  Intake  403.17 ml  Output 2585 ml  Net -2181.83 ml   Filed Weights   12/16/18 1509  Weight: (!) 156.5 kg    Examination:  General exam: Appears calm and comfortable  Respiratory system: Clear to auscultation. Respiratory effort normal. Cardiovascular system: S1 & S2 heard, RRR  Gastrointestinal system: Morbidly obese, soft, nondistended Central nervous system: Alert and oriented. No focal neurological deficits. Extremities: Symmetric 5 x 5 power. Skin: No rashes, lesions Psychiatry: Judgement and insight appear normal. Mood & affect appropriate.   Data Reviewed: I have personally reviewed following labs and imaging studies  CBC: Recent Labs  Lab 12/16/18 1744 12/16/18 2243 12/17/18 0221  WBC 6.8  --  6.6  NEUTROABS 4.1  --   --   HGB 7.3* 8.6* 8.5*  HCT 24.7* 28.6* 26.6*  MCV 93.2  --  87.2  PLT 155  --  097*   Basic Metabolic Panel: Recent Labs  Lab 12/16/18 1744 12/17/18 0221  NA 136 137  K 3.7 3.3*  CL 96* 96*  CO2 25 26  GLUCOSE 188* 138*  BUN 61* 19  CREATININE 10.74* 5.00*  CALCIUM 8.7* 9.4   GFR: Estimated Creatinine Clearance: 21.4 mL/min (A) (by C-G formula based on SCr of 5 mg/dL (H)). Liver Function Tests: No results for input(s): AST, ALT, ALKPHOS, BILITOT, PROT, ALBUMIN in the last 168 hours. No results for input(s): LIPASE, AMYLASE in the last 168 hours. No results for input(s): AMMONIA in the last 168 hours. Coagulation Profile: No results for input(s): INR, PROTIME in the last 168 hours. Cardiac Enzymes: No results for input(s): CKTOTAL, CKMB, CKMBINDEX, TROPONINI in the last 168 hours. BNP (last 3 results) No results for input(s): PROBNP in the last 8760 hours. HbA1C: No results for input(s): HGBA1C in the last 72 hours. CBG: Recent Labs  Lab 12/17/18 0056 12/17/18 1014  GLUCAP 139* 127*   Lipid Profile: No results for input(s): CHOL, HDL, LDLCALC, TRIG, CHOLHDL, LDLDIRECT in the last 72 hours. Thyroid Function Tests: No results for input(s): TSH, T4TOTAL, FREET4, T3FREE, THYROIDAB in the last 72 hours. Anemia Panel: No results for input(s): VITAMINB12, FOLATE, FERRITIN, TIBC, IRON, RETICCTPCT in the last 72 hours. Sepsis Labs: No results for input(s): PROCALCITON, LATICACIDVEN in the last 168 hours.  Recent  Results (from the past 240 hour(s))  SARS Coronavirus 2 (CEPHEID - Performed in [place] hospital lab), Hosp Order     Status: None   Collection Time: 12/16/18  9:00 PM  Result Value Ref Range Status   SARS Coronavirus 2 NEGATIVE NEGATIVE Final    Comment: (NOTE) If result is NEGATIVE SARS-CoV-2 target nucleic acids are NOT DETECTED. The SARS-CoV-2 RNA is generally detectable in upper and lower  respiratory specimens during the acute phase of infection. The lowest  concentration of SARS-CoV-2 viral copies this assay can detect is 250  copies / mL. A negative result does not preclude SARS-CoV-2 infection  and should not be used as the sole basis for treatment or other  patient management decisions.  A negative result may occur with  improper specimen collection / handling, submission of specimen other  than nasopharyngeal swab, presence of viral mutation(s) within the  areas targeted by this assay, and inadequate number of viral copies  (<250 copies / mL). A negative result must be combined with clinical  observations, patient history, and epidemiological information. If result is POSITIVE SARS-CoV-2 target nucleic acids are DETECTED. The SARS-CoV-2 RNA is generally detectable in upper and lower  respiratory specimens dur  ing the acute phase of infection.  Positive  results are indicative of active infection with SARS-CoV-2.  Clinical  correlation with patient history and other diagnostic information is  necessary to determine patient infection status.  Positive results do  not rule out bacterial infection or co-infection with other viruses. If result is PRESUMPTIVE POSTIVE SARS-CoV-2 nucleic acids MAY BE PRESENT.   A presumptive positive result was obtained on the submitted specimen  and confirmed on repeat testing.  While 2019 novel coronavirus  (SARS-CoV-2) nucleic acids may be present in the submitted sample  additional confirmatory testing may be necessary for epidemiological   and / or clinical management purposes  to differentiate between  SARS-CoV-2 and other Sarbecovirus currently known to infect humans.  If clinically indicated additional testing with an alternate test  methodology [phone]) is advised. The SARS-CoV-2 RNA is generally  detectable in upper and lower respiratory sp ecimens during the acute  phase of infection. The expected result is Negative. Fact Sheet for Patients:  StrictlyIdeas.no Fact Sheet for Healthcare Providers: BankingDealers.co.za This test is not yet approved or cleared by the [place] FDA and has been authorized for detection and/or diagnosis of SARS-CoV-2 by FDA under an Emergency Use Authorization (EUA).  This EUA will remain in effect (meaning this test can be used) for the duration of the COVID-19 declaration under Section 564(b)(1) of the Act, 21 U.S.C. section 360bbb-3(b)(1), unless the authorization is terminated or revoked sooner. Performed at [facility], [address], [place], [place] [address]   MRSA PCR Screening     Status: None   Collection Time: 12/17/18  2:11 AM  Result Value Ref Range Status   MRSA by PCR NEGATIVE NEGATIVE Final    Comment:        The GeneXpert MRSA Assay (FDA approved for NASAL specimens only), is one component of a comprehensive MRSA colonization surveillance program. It is not intended to diagnose MRSA infection nor to guide or monitor treatment for MRSA infections. Performed at [facility], [place] [address], [place], [place] [address]      Radiology Studies: No results found.  Scheduled Meds: . atorvastatin  20 mg Oral Daily  . busPIRone  7.5 mg Oral BID  . Chlorhexidine Gluconate Cloth  6 each Topical Q0600  . cinacalcet  30 mg Oral Q breakfast  . [START ON 12/18/2018] colchicine  0.6 mg Oral Q T,Th,Sat-1800  . [START ON 12/18/2018] darbepoetin (ARANESP) injection - DIALYSIS  100 mcg Intravenous Q Sat-HD  .  doxercalciferol  1 mcg Intravenous Q M,W,F-HD  . heparin  5,000 Units Subcutaneous Q8H  . insulin aspart  0-9 Units Subcutaneous TID WC  . insulin glargine  15 Units Subcutaneous Daily  . losartan  25 mg Oral Daily  . PARoxetine  20 mg Oral Daily  . sodium chloride flush  3 mL Intravenous Q12H   Continuous Infusions: . sodium chloride    . sodium chloride    . sodium chloride       LOS: 1 day   Marylu Lund, MD [facility] Pager On Amion  If 7PM-7AM, please contact night-coverage 12/17/2018, 11:27 AM

## 2018-12-17 NOTE — Progress Notes (Signed)
Patient came to the floor from  AP via PTAR  around 0200 Related to scheduled surgery today due to right ankle fracture . Alert and oriented X4, vital signs normal at patient baseline, orders released, CHG bath given, MRSA by PCR  Ordered and sent to lab,CAM boot on LLE,  Waiting on RT to set up Pt. own CPAP,O2 95% ON 2L Nasal Canula, waiting on pre surgery ensure from pharmacy, call light within reach, will continue to monitor.

## 2018-12-17 NOTE — Progress Notes (Signed)
Pt complains of CPAP not working properly, nurse called and informed the  assigned respiratory therapist to check pt CPAP, asymptomatic, still on 2 liters of nasal canula, will continue to monitor patient.

## 2018-12-18 ENCOUNTER — Inpatient Hospital Stay (HOSPITAL_COMMUNITY): Payer: Medicaid Other

## 2018-12-18 LAB — CBC
HCT: 28 % — ABNORMAL LOW (ref 36.0–46.0)
Hemoglobin: 8.7 g/dL — ABNORMAL LOW (ref 12.0–15.0)
MCH: 28.2 pg (ref 26.0–34.0)
MCHC: 31.1 g/dL (ref 30.0–36.0)
MCV: 90.6 fL (ref 80.0–100.0)
Platelets: 160 10*3/uL (ref 150–400)
RBC: 3.09 MIL/uL — ABNORMAL LOW (ref 3.87–5.11)
RDW: 18 % — ABNORMAL HIGH (ref 11.5–15.5)
WBC: 8.5 10*3/uL (ref 4.0–10.5)
nRBC: 0 % (ref 0.0–0.2)

## 2018-12-18 LAB — RENAL FUNCTION PANEL
Albumin: 2.9 g/dL — ABNORMAL LOW (ref 3.5–5.0)
Anion gap: 18 — ABNORMAL HIGH (ref 5–15)
BUN: 30 mg/dL — ABNORMAL HIGH (ref 6–20)
CO2: 22 mmol/L (ref 22–32)
Calcium: 8.5 mg/dL — ABNORMAL LOW (ref 8.9–10.3)
Chloride: 96 mmol/L — ABNORMAL LOW (ref 98–111)
Creatinine, Ser: 7.24 mg/dL — ABNORMAL HIGH (ref 0.44–1.00)
GFR calc Af Amer: 7 mL/min — ABNORMAL LOW (ref >60)
GFR calc non Af Amer: 6 mL/min — ABNORMAL LOW (ref >60)
Glucose, Bld: 148 mg/dL — ABNORMAL HIGH (ref 70–99)
Phosphorus: 4.3 mg/dL (ref 2.5–4.6)
Potassium: 3.5 mmol/L (ref 3.5–5.1)
Sodium: 136 mmol/L (ref 135–145)

## 2018-12-18 LAB — GLUCOSE, CAPILLARY
Glucose-Capillary: 135 mg/dL — ABNORMAL HIGH (ref 70–99)
Glucose-Capillary: 154 mg/dL — ABNORMAL HIGH (ref 70–99)
Glucose-Capillary: 162 mg/dL — ABNORMAL HIGH (ref 70–99)

## 2018-12-18 LAB — PARATHYROID HORMONE, INTACT (NO CA): PTH: 175 pg/mL — ABNORMAL HIGH (ref 15–65)

## 2018-12-18 MED ORDER — DARBEPOETIN ALFA 100 MCG/0.5ML IJ SOSY
PREFILLED_SYRINGE | INTRAMUSCULAR | Status: AC
  Administered 2018-12-18: 100 ug via INTRAVENOUS
  Filled 2018-12-18: qty 0.5

## 2018-12-18 MED ORDER — DOXERCALCIFEROL 4 MCG/2ML IV SOLN
INTRAVENOUS | Status: AC
  Administered 2018-12-18: 1 ug via INTRAVENOUS
  Filled 2018-12-18: qty 2

## 2018-12-18 MED ORDER — HEPARIN SODIUM (PORCINE) 1000 UNIT/ML IJ SOLN
INTRAMUSCULAR | Status: AC
  Filled 2018-12-18: qty 1

## 2018-12-18 NOTE — Evaluation (Signed)
Physical Therapy Evaluation Patient Details Name: SEPHIRA ZELLMAN MRN: [id number] DOB: [birth year] Today's Date: 12/18/2018   History of Present Illness  Pt is a 53 y/o female s/p R ankle ORIF. PMH including but not limited to Bipolar, ESRD, depression, History of enucleation of left eyeball.    Clinical Impression  Pt presented supine in bed with HOB elevated, awake and willing to participate in therapy session. Prior to admission, pt reported that she was residing at a SNF where she required assistance for ADLs and a mechanical lift for transfers. At the time of evaluation, pt very limited secondary to pain, fatigue and weakness. Pt would continue to benefit from skilled physical therapy services at this time while admitted and after d/c to address the below listed limitations in order to improve overall safety and independence with functional mobility.     Follow Up Recommendations SNF    Equipment Recommendations  None recommended by PT    Recommendations for Other Services       Precautions / Restrictions Precautions Precautions: Fall Precaution Comments: visual impairment (can only see shadows) Restrictions Weight Bearing Restrictions: Yes RLE Weight Bearing: Non weight bearing      Mobility  Bed Mobility Overal bed mobility: Needs Assistance Bed Mobility: Supine to Sit;Sit to Supine     Supine to sit: Mod assist;+2 for physical assistance;HOB elevated Sit to supine: Mod assist   General bed mobility comments: increased time and effort, verbal and tactile cueing for sequencing, heavy assistance needed for trunk elevation  Transfers Overall transfer level: Needs assistance   Transfers: Lateral/Scoot Transfers          Lateral/Scoot Transfers: Min assist General transfer comment: pt able to scoot at EOB towards her R side towards [facility] with use of bilateral UEs and L LE; maintained NWB R LE throughout  Ambulation/Gait                Stairs             Wheelchair Mobility    Modified Rankin (Stroke Patients Only)       Balance Overall balance assessment: Needs assistance Sitting-balance support: Feet supported Sitting balance-Leahy Scale: Fair                                       Pertinent Vitals/Pain Pain Assessment: Faces Faces Pain Scale: Hurts even more Pain Location: L ankle Pain Descriptors / Indicators: Grimacing;Guarding Pain Intervention(s): Monitored during session;Repositioned    Home Living Family/patient expects to be discharged to:: Skilled nursing facility                      Prior Function Level of Independence: Needs assistance   Gait / Transfers Assistance Needed: required [employer] lift for OOB to w/c  ADL's / Homemaking Assistance Needed: assistance needed for bathing/dressing        Hand Dominance        Extremity/Trunk Assessment   Upper Extremity Assessment Upper Extremity Assessment: Overall WFL for tasks assessed    Lower Extremity Assessment Lower Extremity Assessment: Generalized weakness       Communication   Communication: No difficulties  Cognition Arousal/Alertness: Awake/alert Behavior During Therapy: WFL for tasks assessed/performed Overall Cognitive Status: Within Functional Limits for tasks assessed  General Comments      Exercises     Assessment/Plan    PT Assessment Patient needs continued PT services  PT Problem List Decreased strength;Decreased balance;Decreased mobility;Decreased coordination;Decreased knowledge of use of DME;Decreased safety awareness;Decreased knowledge of precautions;Pain       PT Treatment Interventions DME instruction;Functional mobility training;Therapeutic activities;Therapeutic exercise;Balance training;Neuromuscular re-education;Patient/family education    PT Goals (Current goals can be found in the Care Plan section)  Acute Rehab PT Goals Patient  Stated Goal: decrease pain PT Goal Formulation: With patient Time For Goal Achievement: 01/01/19 Potential to Achieve Goals: Good    Frequency Min 3X/week   Barriers to discharge        Co-evaluation               AM-PAC PT "6 Clicks" Mobility  Outcome Measure Help needed turning from your back to your side while in a flat bed without using bedrails?: A Lot Help needed moving from lying on your back to sitting on the side of a flat bed without using bedrails?: A Lot Help needed moving to and from a bed to a chair (including a wheelchair)?: Total Help needed standing up from a chair using your arms (e.g., wheelchair or bedside chair)?: Total Help needed to walk in hospital room?: Total Help needed climbing 3-5 steps with a railing? : Total 6 Click Score: 8    End of Session   Activity Tolerance: Patient tolerated treatment well Patient left: in bed;with call bell/phone within reach;with bed alarm set Nurse Communication: Mobility status PT Visit Diagnosis: Other abnormalities of gait and mobility (R26.89);Muscle weakness (generalized) (M62.81)    Time: 6222-9798 PT Time Calculation (min) (ACUTE ONLY): 19 min   Charges:   PT Evaluation $PT Eval Moderate Complexity: 1 Mod          Sherie Don, PT, DPT  Acute Rehabilitation Services Pager [phone] Office [place] 12/18/2018, 12:49 PM

## 2018-12-18 NOTE — Progress Notes (Signed)
Neb tx given at this time, O2 running thru CPAP at 2lpm.

## 2018-12-18 NOTE — Progress Notes (Signed)
[facility] ROUNDING NOTE   Subjective:   [name] 53 years of age status post right trimalleolar fracture of ankle open reduction and internal fixation by Dr. Amalia Hailey.  Appreciate assistance from Dr. Amalia Hailey.  Monday Wednesday Friday dialysis missed 12/15/2018 last dialysis treatment was 12/16/2018 [facility] she has a history of hypertension diabetes diabetic retinopathy morbid obesity depression.  She is congestive heart failure although her 2D echo was positive for diastolic dysfunction 1/61/0960.  She underwent dialysis 12/16/2018 with removal of 2.5  L  Blood pressure 117/56 pulse 87 temperature 99.2 O2 sats 90% CPAP.  Sodium 136 potassium 3.5 chloride 96 CO2 22 BUN 30 creatinine 7.  2 4 glucose 148 calcium 8.5 phosphorus 4.3 albumin 2.9 last hemoglobin 8.5  Meds Lipitor 20 mg daily BuSpar 7.5 mg twice daily, cinacalcet 30 mg daily, colchicine 0.6 mg Tuesday Thursday Saturday, darbepoetin 100 mcg every Saturday ordered, Hectorol 1 mcg every Saturday, glargine insulin 15 units subcu daily, losartan 25 mg daily, Paxil 20 mg daily,  Objective:  Vital signs in last 24 hours:  Temp:  [97 F (36.1 C)-99.2 F (37.3 C)] 99.2 F (37.3 C) (05/23 0555) Pulse Rate:  [77-98] 87 (05/23 0555) Resp:  [14-18] 15 (05/23 0555) BP: (92-147)/(47-68) 117/56 (05/23 0555) SpO2:  [72 %-94 %] 90 % (05/23 0555) Weight:  [156.5 kg-180.9 kg] 180.9 kg (05/23 0500)  Weight change: 0 kg Filed Weights   12/16/18 1509 12/17/18 1434 12/18/18 0500  Weight: (!) 156.5 kg (!) 156.5 kg (!) 180.9 kg    Intake/Output: I/O last 3 completed shifts: In: 1143.2 [P.O.:240; I.V.:588.2; Blood:315] Out: 2615 [Other:2585; Blood:30]   Intake/Output this shift:  No intake/output data recorded.  Overweight obese female using CPAP. CVS-regular rate and rhythm faint 3/6 systolic murmur left sternal edge RS-diminished air entry at the bases no wheezes no rales ABD- BS present soft  non-distended EXT-right foot boot noted   Basic Metabolic Panel: Recent Labs  Lab 12/16/18 1744 12/17/18 0221 12/18/18 0345  NA 136 137 136  K 3.7 3.3* 3.5  CL 96* 96* 96*  CO2 25 26 22   GLUCOSE 188* 138* 148*  BUN 61* 19 30*  CREATININE 10.74* 5.00* 7.24*  CALCIUM 8.7* 9.4 8.5*  PHOS  --   --  4.3    Liver Function Tests: Recent Labs  Lab 12/18/18 0345  ALBUMIN 2.9*   No results for input(s): LIPASE, AMYLASE in the last 168 hours. No results for input(s): AMMONIA in the last 168 hours.  CBC: Recent Labs  Lab 12/16/18 1744 12/16/18 2243 12/17/18 0221  WBC 6.8  --  6.6  NEUTROABS 4.1  --   --   HGB 7.3* 8.6* 8.5*  HCT 24.7* 28.6* 26.6*  MCV 93.2  --  87.2  PLT 155  --  135*    Cardiac Enzymes: No results for input(s): CKTOTAL, CKMB, CKMBINDEX, TROPONINI in the last 168 hours.  BNP: Invalid input(s): POCBNP  CBG: Recent Labs  Lab 12/17/18 1149 12/17/18 1349 12/17/18 1700 12/17/18 1805 12/17/18 2127  GLUCAP 121* 117* 138* 138* 121*    Microbiology: Results for orders placed or performed during the hospital encounter of 12/16/18  SARS Coronavirus 2 (CEPHEID - Performed in [place] hospital lab), Hosp Order     Status: None   Collection Time: 12/16/18  9:00 PM  Result Value Ref Range Status   SARS Coronavirus 2 NEGATIVE NEGATIVE Final    Comment: (NOTE) If result is NEGATIVE SARS-CoV-2 target  nucleic acids are NOT DETECTED. The SARS-CoV-2 RNA is generally detectable in upper and lower  respiratory specimens during the acute phase of infection. The lowest  concentration of SARS-CoV-2 viral copies this assay can detect is 250  copies / mL. A negative result does not preclude SARS-CoV-2 infection  and should not be used as the sole basis for treatment or other  patient management decisions.  A negative result may occur with  improper specimen collection / handling, submission of specimen other  than nasopharyngeal swab, presence of viral  mutation(s) within the  areas targeted by this assay, and inadequate number of viral copies  (<250 copies / mL). A negative result must be combined with clinical  observations, patient history, and epidemiological information. If result is POSITIVE SARS-CoV-2 target nucleic acids are DETECTED. The SARS-CoV-2 RNA is generally detectable in upper and lower  respiratory specimens dur ing the acute phase of infection.  Positive  results are indicative of active infection with SARS-CoV-2.  Clinical  correlation with patient history and other diagnostic information is  necessary to determine patient infection status.  Positive results do  not rule out bacterial infection or co-infection with other viruses. If result is PRESUMPTIVE POSTIVE SARS-CoV-2 nucleic acids MAY BE PRESENT.   A presumptive positive result was obtained on the submitted specimen  and confirmed on repeat testing.  While 2019 novel coronavirus  (SARS-CoV-2) nucleic acids may be present in the submitted sample  additional confirmatory testing may be necessary for epidemiological  and / or clinical management purposes  to differentiate between  SARS-CoV-2 and other Sarbecovirus currently known to infect humans.  If clinically indicated additional testing with an alternate test  methodology [id number]) is advised. The SARS-CoV-2 RNA is generally  detectable in upper and lower respiratory sp ecimens during the acute  phase of infection. The expected result is Negative. Fact Sheet for Patients:  StrictlyIdeas.no Fact Sheet for Healthcare Providers: BankingDealers.co.za This test is not yet approved or cleared by the [place] FDA and has been authorized for detection and/or diagnosis of SARS-CoV-2 by FDA under an Emergency Use Authorization (EUA).  This EUA will remain in effect (meaning this test can be used) for the duration of the COVID-19 declaration under Section 564(b)(1)  of the Act, 21 U.S.C. section 360bbb-3(b)(1), unless the authorization is terminated or revoked sooner. Performed at [facility], [address], [place], [place] [address]   MRSA PCR Screening     Status: None   Collection Time: 12/17/18  2:11 AM  Result Value Ref Range Status   MRSA by PCR NEGATIVE NEGATIVE Final    Comment:        The GeneXpert MRSA Assay (FDA approved for NASAL specimens only), is one component of a comprehensive MRSA colonization surveillance program. It is not intended to diagnose MRSA infection nor to guide or monitor treatment for MRSA infections. Performed at [facility], [place] [address] [place], [place] [address]     Coagulation Studies: No results for input(s): LABPROT, INR in the last 72 hours.  Urinalysis: No results for input(s): COLORURINE, LABSPEC, PHURINE, GLUCOSEU, HGBUR, BILIRUBINUR, KETONESUR, PROTEINUR, UROBILINOGEN, NITRITE, LEUKOCYTESUR in the last 72 hours.  Invalid input(s): APPERANCEUR    Imaging: No results found.   Medications:   . sodium chloride    . sodium chloride    . sodium chloride    . sodium chloride     . atorvastatin  20 mg Oral Daily  . busPIRone  7.5 mg Oral  BID  . Chlorhexidine Gluconate Cloth  6 each Topical Q0600  . cinacalcet  30 mg Oral Q breakfast  . colchicine  0.6 mg Oral Q T,Th,Sat-1800  . darbepoetin (ARANESP) injection - DIALYSIS  100 mcg Intravenous Q Sat-HD  . doxercalciferol  1 mcg Intravenous Q M,W,F-HD  . doxercalciferol  1 mcg Intravenous Q Sat-HD  . heparin  5,000 Units Subcutaneous Q8H  . insulin aspart  0-9 Units Subcutaneous TID WC  . insulin glargine  15 Units Subcutaneous Daily  . losartan  25 mg Oral Daily  . PARoxetine  20 mg Oral Daily  . sodium chloride flush  3 mL Intravenous Q12H  . sodium chloride flush  3 mL Intravenous Q12H   sodium chloride, sodium chloride, sodium chloride, acetaminophen **OR** acetaminophen, albuterol, ALPRAZolam, alteplase, bisacodyl, calcium  carbonate, heparin, heparin, hydrALAZINE, HYDROcodone-acetaminophen, lidocaine-prilocaine, ondansetron **OR** ondansetron (ZOFRAN) IV, polyethylene glycol, sodium chloride flush, traZODone  Assessment/ Plan:   End-stage renal disease usually Monday Wednesday Friday dialysis.  Dialysis 12/18/2018 she has a right IJ dialysis cath follow-up by Dr. Lowanda Foster [facility]  She is usually Monday Wednesday Friday.  She is off schedule  Hypertension/volume difficult to assess secondary to body habitus 2 L removed 12/16/2018  Bones continue Hectorol Sensipar follow calcium phosphorus  Anemia continue darbepoetin.  Status post ORIF right trimalleolar fracture appreciated assistance of Dr. Daylene Katayama  Diabetes mellitus per primary team  Sleep apnea per primary team  Hyperlipidemia continues on statin.     LOS: 2 Sherril Croon @TODAY @8 :04 AM

## 2018-12-18 NOTE — Op Note (Signed)
OPERATIVE REPORT Patient name: Martha [name] MRN: [id number] DOB: [birth year]  DOS:  12/17/2018  Preop Dx: Trimalleolar ankle fracture right Postop Dx: same  Procedure:  1.  ORIF right ankle fracture  Surgeon: Edrick Kins DPM  Anesthesia: General anesthesia with popliteal leg block right lower extremity  Hemostasis: Calf tourniquet inflated to a pressure of 256mmHg after esmarch exsanguination   EBL: 100 mL Materials: 8 hole Stryker one third tubular plate with respective screws.  Stryker 4.0 mm cannulated screw x1 Injectables: 20 cc 0.5% Marcaine plain and a high ankle block fashion Pathology: None  Condition: The patient tolerated the procedure and anesthesia well. No complications noted or reported   Justification for procedure: The patient is a 53 y.o. female who presents today for surgical correction of trimalleolar ankle fracture right. All conservative modalities of been unsuccessful in providing any sort of satisfactory alleviation of symptoms with the patient. The patient was told benefits as well as possible side effects of the surgery. The patient consented for surgical correction. The patient consent form was reviewed. All patient questions were answered. No guarantees were expressed or implied. The patient and the surgeon boson the patient consent form with the witness present and placed in the patient's chart.   Procedure in Detail: The patient was brought to the operating room, placed in the operating table in the supine position at which time an aseptic scrub and drape were performed about the patient's respective lower extremity after anesthesia was induced as described above. Attention was then directed to the surgical area where procedure number one commenced.  Procedure #1: ORIF right trimalleolar ankle fracture A 10 cm linear longitudinal skin incision was planned and made bisecting the distal aspect of the fibula right lower extremity.  Incision was  carefully taken down to the level of bone and periosteum with care taken to cut clamp ligate and retract away all small neurovascular structures traversing the incision site.  Fibular fracture was identified and reduced and temporarily held in place with a 0.062 inch K wire.  Stryker one third tubular 8 hole plate was utilized and placed over the fracture site.  Intraoperative x-ray fluoroscopy was utilized to ensure the reduction of the fracture as well as placement of the plate.  Nonlocking cortical screw was utilized to ensure the plate was flush and in good contact with the bone.  Locking screws were utilized and the remaining plate holes for permanent internal fixation.  Screws and plate were inserted in the standard AO fashion.  Intraoperative x-ray fluoroscopy was again utilized to ensure placement of the screws and orthopedic hardware with reduction of the fracture which was satisfactory.  All temporary fixation was then removed. Attention was then directed to the medial aspect of the ankle where a 3 cm curvilinear skin incision was made over the medial malleolus.  Care was taken to identify retract and preserve the saphenous vein and neurovascular structures traversing the incision site.  The medial malleolar ankle fracture was identified in the deltoid ligament fibers within the fracture site were removed.  The medial malleolar fracture was reduced and a 4.0 mm cannulated screw was placed through the medial malleolus for permanent internal fixation.  Intraoperative x-ray fluoroscopy was utilized to visualize the reduction of the fracture fragment as well as placement of the orthopedic screw.  Care was taken to ensure that no hardware disrupted the ankle joint. Copious irrigation was then utilized in preparation for routine layered primary closure.  3-0 Vicryl suture was  utilized to reapproximate deep subcutaneous tissue followed by 4-0 Vicryl suture to reapproximate subcuticular tissue followed by  stainless steel skin staples to reapproximate superficial skin edges.  Dry sterile compressive dressings were then applied to all previously mentioned incision sites about the patient's lower extremity. The tourniquet which was used for hemostasis was deflated. All normal neurovascular responses including pink color and warmth returned all the digits of patient's lower extremity.  CAM boot was placed prior to the patient leaving the operating room.  The patient was then transferred from the operating room to the recovery room having tolerated the procedure and anesthesia well. All vital signs are stable. After a brief stay in the recovery room the patient was readmitted to inpatient room with adequate prescriptions for analgesia. Verbal as well as written instructions were provided for the patient regarding wound care. The patient is to keep the dressings clean dry and intact until they are to follow surgeon Dr. Daylene Katayama in the office upon discharge.  The patient is to be strictly nonweightbearing to the surgical extremity until instructed otherwise.  Edrick Kins, DPM [facility]  Dr. Edrick Kins, DPM    [address], [place] [address]                Office [phone]  Fax [phone]

## 2018-12-18 NOTE — Progress Notes (Signed)
RT was called to assess patient's CPAP as the RN from hemodialysis called to say that is was leaking water. The CPAP is still working but RT recommended having the patient's home health company assess the CPAP to make sure that there were no problems with the machine and for the patient to wear the hospitals CPAP machine. The patient refused and is insistent on wearing her own machine. This was cleared with the lead respiratory therapist, Janan Halter, RRT.

## 2018-12-18 NOTE — Progress Notes (Signed)
   Podiatry progress note/postop  HPI: Patient status post ORIF trimalleolar ankle fracture right lower extremity. DOS: 12/17/2018.  Patient resting in bed this morning.  Patient has some moderate pain to the surgical extremity but she says she just took some pain medication and she feels much better.  No other complaints regarding surgical extremity  Past Medical History:  Diagnosis Date  . Acid reflux    takes Tums  . Anemia   . Arthritis   . Bipolar 1 disorder ([place])   . Blindness of left eye   . Carpal tunnel syndrome, bilateral   . Cervical radiculopathy   . CHF (congestive heart failure) ([place])   . Chronic kidney disease    Stage 5- 01/25/17  . Constipation   . COPD (chronic obstructive pulmonary disease) ([place]) 2014   bronchitis  . Degenerative disc disease, thoracic   . Depression   . Diabetes mellitus    Type II  . Diabetic retinopathy ([place])   . Dyspnea    when walking  . End stage kidney disease ([place])    M, W, F [facility]  . Hypertension   . Noncompliance with medication regimen   . Noncompliance with medication regimen   . Obesity (BMI 30-39.9)   . OSA (obstructive sleep apnea)    cpap  . Panic attack   . RLS (restless legs syndrome)     Physical Exam: General: The patient is alert and oriented x3 in no acute distress. MSK exam: Cam boot intact.  No strikethrough noted with dressings.  Capillary refill to the digits within normal limits.  Assessment: -Status post ORIF RT ankle fracture  -From a surgical standpoint the patient is okay to be discharged back to the SNF facility in [place].   -Strict nonweightbearing surgical extremity  -Keep dressings clean dry and intact until follow-up in the office next week  -Follow-up in office in 1 week.     Edrick Kins, DPM [facility]  Dr. Edrick Kins, DPM    [address], [place] [address]                Office [phone]  Fax [phone]

## 2018-12-18 NOTE — Progress Notes (Signed)
PROGRESS NOTE    Martha [name]  OZD:[id number] DOB: 01-Dec-1965 DOA: 12/16/2018 PCP: Sinda Du, MD    Brief Narrative:  53 y.o. female with past medical history relevant for morbid obesity, OSA on CPAP, ESRD with HD usually on Mondays Wednesdays and Fridays, bipolar disorder and depression, history of medication lifestyle noncompliance, diabetes and hypertension, as well as recent right Ankle trimalleolar fracture  (Rt trimalleolar fracture of the ankle--- DOI 11/29/2018, for ORIF by Dr. Daylene Katayama at [place] on 12/17/2018----transfer to [place]... Further management per podiatric team  Patient apparently has been staying at [facility] in [place]... Missed hemodialysis on 12/15/2018, scheduled for ORIF at [place] for right trimalleolar ankle fracture on 12/17/2018...  Presented to [facility] on 12/16/2018--- received hemodialysis on 12/16/2018.... Now being transferred to [place] for previously scheduled orthopedic/podiatric surgery  Denies chest pains, palpitations, no fevers, no chills,  No URI symptoms, no UTI symptoms  COVID 19 test is Negative  Assessment & Plan:   Principal Problem:   Trimalleolar fracture of ankle, closed, right, initial encounter Active Problems:   Morbid obesity ([place])   Depression with anxiety   Essential hypertension   Sleep apnea   Anemia   Bipolar 1 disorder (HCC)   Noncompliance with medication regimen   Dialysis patient ([place])   ESRD (end stage renal disease) on dialysis [facility])   History of enucleation of left eyeball   Pain and swelling of ankle, right  1)Rt trimalleolar fracture of the ankle--- DOI 11/29/2018, for ORIF by Dr. Daylene Katayama at [place] on 12/17/2018 -Dr. Amalia Hailey following. Pt s/p surgery 5/22 -PT consulted  2)ESRD--- usually gets HD on Mondays Wednesdays and Fridays, patient missed HD on 12/15/2018 -Pt underwent HD 12/16/2018 at [facility] prior to transfer to  [facility] -continue torsemide 100 mg daily -Nephrology following with plan for HD today  3)HTN -Continued on losartan 25 mg daily -Currently stable and controlled  4) depression/anxiety/bipolar disorder----this impacts patient's ability to be compliant, continue Paxil 20 mg daily, Xanax and buspirone -Seems stable this AM  5)DM2-A1c was 8.8 about 6 months ago reflecting uncontrolled diabetes, decrease Lantus to use insulin to 15 units daily from 30 units as patient will be n.p.o. for right ankle surgery.  -Continue SSI coverage -Glucose trends stable, reviewed  6) chronic anemia of CKD -hemoglobin currently stable at 8.5,  -would cont EPO  agent as per nephrology team  7) morbid obesity -BMI over 50 -Encourage diet/lifestyle modification  8)OSA -compliance with CPAP emphasized -Pt is using CPAP in hospital  DVT prophylaxis: heparin subq Code Status: Full Family Communication: Pt in room, family not at bedside Disposition Plan: Uncertain at this time, pending PT eval  Consultants:   Podiatry  Nephrology  Procedures:     Antimicrobials: Anti-infectives (From admission, onward)   None      Subjective: Complains of pos-op foot pain this AM, requesting pain med  Objective: Vitals:   12/18/18 0218 12/18/18 0225 12/18/18 0500 12/18/18 0555  BP: (!) 147/68   (!) 117/56  Pulse: 93   87  Resp: 15   15  Temp: 98.9 F (37.2 C)   99.2 F (37.3 C)  TempSrc: Oral   Oral  SpO2: (!) 72% (!) 73%  90%  Weight:   (!) 180.9 kg   Height:        Intake/Output Summary (Last 24 hours) at 12/18/2018 1246 Last data filed at 12/18/2018 0555 Gross per 24  hour  Intake 740 ml  Output 30 ml  Net 710 ml   Filed Weights   12/16/18 1509 12/17/18 1434 12/18/18 0500  Weight: (!) 156.5 kg (!) 156.5 kg (!) 180.9 kg    Examination: General exam: Awake, laying in bed, in nad Respiratory system: Normal respiratory effort, no wheezing Cardiovascular system: regular rate, s1, s2  Gastrointestinal system: Soft, nondistended, positive BS Central nervous system: CN2-12 grossly intact, strength intact Extremities: Perfused, no clubbing Skin: Normal skin turgor, no notable skin lesions seen Psychiatry: Mood normal // no visual hallucinations   Data Reviewed: I have personally reviewed following labs and imaging studies  CBC: Recent Labs  Lab 12/16/18 1744 12/16/18 2243 12/17/18 0221  WBC 6.8  --  6.6  NEUTROABS 4.1  --   --   HGB 7.3* 8.6* 8.5*  HCT 24.7* 28.6* 26.6*  MCV 93.2  --  87.2  PLT 155  --  638*   Basic Metabolic Panel: Recent Labs  Lab 12/16/18 1744 12/17/18 0221 12/18/18 0345  NA 136 137 136  K 3.7 3.3* 3.5  CL 96* 96* 96*  CO2 25 26 22   GLUCOSE 188* 138* 148*  BUN 61* 19 30*  CREATININE 10.74* 5.00* 7.24*  CALCIUM 8.7* 9.4 8.5*  PHOS  --   --  4.3   GFR: Estimated Creatinine Clearance: 16.2 mL/min (A) (by C-G formula based on SCr of 7.24 mg/dL (H)). Liver Function Tests: Recent Labs  Lab 12/18/18 0345  ALBUMIN 2.9*   No results for input(s): LIPASE, AMYLASE in the last 168 hours. No results for input(s): AMMONIA in the last 168 hours. Coagulation Profile: No results for input(s): INR, PROTIME in the last 168 hours. Cardiac Enzymes: No results for input(s): CKTOTAL, CKMB, CKMBINDEX, TROPONINI in the last 168 hours. BNP (last 3 results) No results for input(s): PROBNP in the last 8760 hours. HbA1C: No results for input(s): HGBA1C in the last 72 hours. CBG: Recent Labs  Lab 12/17/18 1349 12/17/18 1700 12/17/18 1805 12/17/18 2127 12/18/18 0824  GLUCAP 117* 138* 138* 121* 135*   Lipid Profile: No results for input(s): CHOL, HDL, LDLCALC, TRIG, CHOLHDL, LDLDIRECT in the last 72 hours. Thyroid Function Tests: No results for input(s): TSH, T4TOTAL, FREET4, T3FREE, THYROIDAB in the last 72 hours. Anemia Panel: Recent Labs    12/17/18 1049  TIBC 174*  IRON 37   Sepsis Labs: No results for input(s): PROCALCITON,  LATICACIDVEN in the last 168 hours.  Recent Results (from the past 240 hour(s))  SARS Coronavirus 2 (CEPHEID - Performed in [place] hospital lab), Hosp Order     Status: None   Collection Time: 12/16/18  9:00 PM  Result Value Ref Range Status   SARS Coronavirus 2 NEGATIVE NEGATIVE Final    Comment: (NOTE) If result is NEGATIVE SARS-CoV-2 target nucleic acids are NOT DETECTED. The SARS-CoV-2 RNA is generally detectable in upper and lower  respiratory specimens during the acute phase of infection. The lowest  concentration of SARS-CoV-2 viral copies this assay can detect is 250  copies / mL. A negative result does not preclude SARS-CoV-2 infection  and should not be used as the sole basis for treatment or other  patient management decisions.  A negative result may occur with  improper specimen collection / handling, submission of specimen other  than nasopharyngeal swab, presence of viral mutation(s) within the  areas targeted by this assay, and inadequate number of viral copies  (<250 copies / mL). A negative result must be combined  with clinical  observations, patient history, and epidemiological information. If result is POSITIVE SARS-CoV-2 target nucleic acids are DETECTED. The SARS-CoV-2 RNA is generally detectable in upper and lower  respiratory specimens dur ing the acute phase of infection.  Positive  results are indicative of active infection with SARS-CoV-2.  Clinical  correlation with patient history and other diagnostic information is  necessary to determine patient infection status.  Positive results do  not rule out bacterial infection or co-infection with other viruses. If result is PRESUMPTIVE POSTIVE SARS-CoV-2 nucleic acids MAY BE PRESENT.   A presumptive positive result was obtained on the submitted specimen  and confirmed on repeat testing.  While 2019 novel coronavirus  (SARS-CoV-2) nucleic acids may be present in the submitted sample  additional confirmatory  testing may be necessary for epidemiological  and / or clinical management purposes  to differentiate between  SARS-CoV-2 and other Sarbecovirus currently known to infect humans.  If clinically indicated additional testing with an alternate test  methodology [phone]) is advised. The SARS-CoV-2 RNA is generally  detectable in upper and lower respiratory sp ecimens during the acute  phase of infection. The expected result is Negative. Fact Sheet for Patients:  StrictlyIdeas.no Fact Sheet for Healthcare Providers: BankingDealers.co.za This test is not yet approved or cleared by the [place] FDA and has been authorized for detection and/or diagnosis of SARS-CoV-2 by FDA under an Emergency Use Authorization (EUA).  This EUA will remain in effect (meaning this test can be used) for the duration of the COVID-19 declaration under Section 564(b)(1) of the Act, 21 U.S.C. section 360bbb-3(b)(1), unless the authorization is terminated or revoked sooner. Performed at [facility], [address], [place], [place] [address]   MRSA PCR Screening     Status: None   Collection Time: 12/17/18  2:11 AM  Result Value Ref Range Status   MRSA by PCR NEGATIVE NEGATIVE Final    Comment:        The GeneXpert MRSA Assay (FDA approved for NASAL specimens only), is one component of a comprehensive MRSA colonization surveillance program. It is not intended to diagnose MRSA infection nor to guide or monitor treatment for MRSA infections. Performed at [facility], [place] [address], [place], [place] [address]      Radiology Studies: No results found.  Scheduled Meds: . atorvastatin  20 mg Oral Daily  . busPIRone  7.5 mg Oral BID  . Chlorhexidine Gluconate Cloth  6 each Topical Q0600  . cinacalcet  30 mg Oral Q breakfast  . colchicine  0.6 mg Oral Q T,Th,Sat-1800  . darbepoetin (ARANESP) injection - DIALYSIS  100 mcg Intravenous Q Sat-HD  .  doxercalciferol  1 mcg Intravenous Q M,W,F-HD  . doxercalciferol  1 mcg Intravenous Q Sat-HD  . heparin  5,000 Units Subcutaneous Q8H  . insulin aspart  0-9 Units Subcutaneous TID WC  . insulin glargine  15 Units Subcutaneous Daily  . losartan  25 mg Oral Daily  . PARoxetine  20 mg Oral Daily  . sodium chloride flush  3 mL Intravenous Q12H  . sodium chloride flush  3 mL Intravenous Q12H   Continuous Infusions: . sodium chloride    . sodium chloride    . sodium chloride    . sodium chloride       LOS: 2 days   Marylu Lund, MD [facility] Pager On Amion  If 7PM-7AM, please contact night-coverage 12/18/2018, 12:46 PM

## 2018-12-19 LAB — GLUCOSE, CAPILLARY
Glucose-Capillary: 131 mg/dL — ABNORMAL HIGH (ref 70–99)
Glucose-Capillary: 115 mg/dL — ABNORMAL HIGH (ref 70–99)

## 2018-12-19 LAB — RENAL FUNCTION PANEL
Albumin: 2.8 g/dL — ABNORMAL LOW (ref 3.5–5.0)
Anion gap: 14 (ref 5–15)
BUN: 14 mg/dL (ref 6–20)
CO2: 26 mmol/L (ref 22–32)
Calcium: 8.9 mg/dL (ref 8.9–10.3)
Chloride: 97 mmol/L — ABNORMAL LOW (ref 98–111)
Creatinine, Ser: 4.5 mg/dL — ABNORMAL HIGH (ref 0.44–1.00)
GFR calc Af Amer: 12 mL/min — ABNORMAL LOW (ref >60)
GFR calc non Af Amer: 11 mL/min — ABNORMAL LOW (ref >60)
Glucose, Bld: 130 mg/dL — ABNORMAL HIGH (ref 70–99)
Phosphorus: 2.6 mg/dL (ref 2.5–4.6)
Potassium: 3.6 mmol/L (ref 3.5–5.1)
Sodium: 137 mmol/L (ref 135–145)

## 2018-12-19 MED ORDER — ALPRAZOLAM 0.25 MG PO TABS: 0.2500 mg | ORAL_TABLET | Freq: Three times a day (TID) | ORAL | 0 refills | Status: DC | PRN

## 2018-12-19 MED ORDER — HYDROCODONE-ACETAMINOPHEN 10-325 MG PO TABS: 1.0000 | ORAL_TABLET | Freq: Four times a day (QID) | ORAL | 0 refills | Status: DC | PRN

## 2018-12-19 MED ORDER — CHLORHEXIDINE GLUCONATE CLOTH 2 % EX PADS: 6.0000 | MEDICATED_PAD | Freq: Every day | CUTANEOUS | Status: DC

## 2018-12-19 MED ORDER — LOPERAMIDE HCL 2 MG PO CAPS: 2.0000 mg | ORAL_CAPSULE | ORAL | Status: DC | PRN

## 2018-12-19 NOTE — Progress Notes (Signed)
Gave report to nurse at [facility] where pt was a prior pt in room A91.   DC papers given and reviewed.

## 2018-12-19 NOTE — Progress Notes (Signed)
[facility] ROUNDING NOTE   Subjective:   [name] 53 years of age status post right trimalleolar fracture of ankle open reduction and internal fixation by Dr. Amalia Hailey.  Appreciate assistance from Dr. Amalia Hailey.  Monday Wednesday Friday dialysis missed 12/15/2018 last dialysis treatment was 12/16/2018 [facility] she has a history of hypertension diabetes diabetic retinopathy morbid obesity depression.  She is congestive heart failure although her 2D echo was positive for diastolic dysfunction 2/95/1884.  She underwent dialysis 12/18/2018 with removal of 2.2 L  Blood pressure 119/63 pulse 76 temperature 97.9 O2 sats 98%  Sodium 137 potassium 3.6 chloride 97 CO2 26 BUN 14 creatinine 4.5 glucose 130 calcium 8.9 phosphorus 2.6 albumin 2.8 hemoglobin 8.7  Meds Lipitor 20 mg daily BuSpar 7.5 mg twice daily, cinacalcet 30 mg daily, colchicine 0.6 mg Tuesday Thursday Saturday, darbepoetin 100 mcg every Saturday ordered, Hectorol 1 mcg every Saturday, glargine insulin 15 units subcu daily, losartan 25 mg daily, Paxil 20 mg daily,  Objective:  Vital signs in last 24 hours:  Temp:  [97.6 F (36.4 C)-99.2 F (37.3 C)] 97.9 F (36.6 C) (05/24 0534) Pulse Rate:  [76-88] 76 (05/24 0534) Resp:  [18-20] 18 (05/24 0534) BP: (97-126)/(38-65) 119/63 (05/24 0534) SpO2:  [85 %-98 %] 98 % (05/24 0534) Weight:  [178.2 kg-180.4 kg] 178.5 kg (05/24 0500)  Weight change: 23.9 kg Filed Weights   12/18/18 1227 12/18/18 1651 12/19/18 0500  Weight: (!) 180.4 kg (!) 178.2 kg (!) 178.5 kg    Intake/Output: I/O last 3 completed shifts: In: 720 [P.O.:720] Out: 2271 [Other:2271]   Intake/Output this shift:  No intake/output data recorded.  Overweight obese female using CPAP. CVS-regular rate and rhythm faint 3/6 systolic murmur left sternal edge RS-diminished air entry at the bases no wheezes no rales ABD- BS present soft non-distended EXT-right foot boot noted   Basic  Metabolic Panel: Recent Labs  Lab 12/16/18 1744 12/17/18 0221 12/18/18 0345 12/19/18 0301  NA 136 137 136 137  K 3.7 3.3* 3.5 3.6  CL 96* 96* 96* 97*  CO2 25 26 22 26   GLUCOSE 188* 138* 148* 130*  BUN 61* 19 30* 14  CREATININE 10.74* 5.00* 7.24* 4.50*  CALCIUM 8.7* 9.4 8.5* 8.9  PHOS  --   --  4.3 2.6    Liver Function Tests: Recent Labs  Lab 12/18/18 0345 12/19/18 0301  ALBUMIN 2.9* 2.8*   No results for input(s): LIPASE, AMYLASE in the last 168 hours. No results for input(s): AMMONIA in the last 168 hours.  CBC: Recent Labs  Lab 12/16/18 1744 12/16/18 2243 12/17/18 0221 12/18/18 1408  WBC 6.8  --  6.6 8.5  NEUTROABS 4.1  --   --   --   HGB 7.3* 8.6* 8.5* 8.7*  HCT 24.7* 28.6* 26.6* 28.0*  MCV 93.2  --  87.2 90.6  PLT 155  --  135* 160    Cardiac Enzymes: No results for input(s): CKTOTAL, CKMB, CKMBINDEX, TROPONINI in the last 168 hours.  BNP: Invalid input(s): POCBNP  CBG: Recent Labs  Lab 12/17/18 2127 12/18/18 0824 12/18/18 1748 12/18/18 2137 12/19/18 0736  GLUCAP 121* 135* 154* 162* 131*    Microbiology: Results for orders placed or performed during the hospital encounter of 12/16/18  SARS Coronavirus 2 (CEPHEID - Performed in [place] hospital lab), Hosp Order     Status: None   Collection Time: 12/16/18  9:00 PM  Result Value Ref Range Status   SARS Coronavirus  2 NEGATIVE NEGATIVE Final    Comment: (NOTE) If result is NEGATIVE SARS-CoV-2 target nucleic acids are NOT DETECTED. The SARS-CoV-2 RNA is generally detectable in upper and lower  respiratory specimens during the acute phase of infection. The lowest  concentration of SARS-CoV-2 viral copies this assay can detect is 250  copies / mL. A negative result does not preclude SARS-CoV-2 infection  and should not be used as the sole basis for treatment or other  patient management decisions.  A negative result may occur with  improper specimen collection / handling, submission of  specimen other  than nasopharyngeal swab, presence of viral mutation(s) within the  areas targeted by this assay, and inadequate number of viral copies  (<250 copies / mL). A negative result must be combined with clinical  observations, patient history, and epidemiological information. If result is POSITIVE SARS-CoV-2 target nucleic acids are DETECTED. The SARS-CoV-2 RNA is generally detectable in upper and lower  respiratory specimens dur ing the acute phase of infection.  Positive  results are indicative of active infection with SARS-CoV-2.  Clinical  correlation with patient history and other diagnostic information is  necessary to determine patient infection status.  Positive results do  not rule out bacterial infection or co-infection with other viruses. If result is PRESUMPTIVE POSTIVE SARS-CoV-2 nucleic acids MAY BE PRESENT.   A presumptive positive result was obtained on the submitted specimen  and confirmed on repeat testing.  While 2019 novel coronavirus  (SARS-CoV-2) nucleic acids may be present in the submitted sample  additional confirmatory testing may be necessary for epidemiological  and / or clinical management purposes  to differentiate between  SARS-CoV-2 and other Sarbecovirus currently known to infect humans.  If clinically indicated additional testing with an alternate test  methodology [phone]) is advised. The SARS-CoV-2 RNA is generally  detectable in upper and lower respiratory sp ecimens during the acute  phase of infection. The expected result is Negative. Fact Sheet for Patients:  StrictlyIdeas.no Fact Sheet for Healthcare Providers: BankingDealers.co.za This test is not yet approved or cleared by the [place] FDA and has been authorized for detection and/or diagnosis of SARS-CoV-2 by FDA under an Emergency Use Authorization (EUA).  This EUA will remain in effect (meaning this test can be used) for the  duration of the COVID-19 declaration under Section 564(b)(1) of the Act, 21 U.S.C. section 360bbb-3(b)(1), unless the authorization is terminated or revoked sooner. Performed at [facility], [address] [place], [place] [address]   MRSA PCR Screening     Status: None   Collection Time: 12/17/18  2:11 AM  Result Value Ref Range Status   MRSA by PCR NEGATIVE NEGATIVE Final    Comment:        The GeneXpert MRSA Assay (FDA approved for NASAL specimens only), is one component of a comprehensive MRSA colonization surveillance program. It is not intended to diagnose MRSA infection nor to guide or monitor treatment for MRSA infections. Performed at [facility], [place] [address], [place], [place] [address]     Coagulation Studies: No results for input(s): LABPROT, INR in the last 72 hours.  Urinalysis: No results for input(s): COLORURINE, LABSPEC, PHURINE, GLUCOSEU, HGBUR, BILIRUBINUR, KETONESUR, PROTEINUR, UROBILINOGEN, NITRITE, LEUKOCYTESUR in the last 72 hours.  Invalid input(s): APPERANCEUR    Imaging: Dg Ankle Complete Right  Result Date: 12/18/2018 CLINICAL DATA:  Pain.  ORIF EXAM: RIGHT ANKLE - COMPLETE 3+ VIEW COMPARISON:  11/29/2018 FINDINGS: The patient has undergone ORIF of the  right ankle. There is plate and screw fixation of the distal fibula and a transcortical screw through the medial malleolus. The alignment is significantly improved. Overlying skin staples are noted. There is soft tissue swelling. A moderate-sized plantar calcaneal spur and Achilles tendon enthesophyte are again noted. IMPRESSION: Improved osseous alignment status post ORIF of the right ankle. Electronically Signed   By: Constance Holster M.D.   On: 12/18/2018 19:24     Medications:   . sodium chloride    . sodium chloride    . sodium chloride    . sodium chloride     . atorvastatin  20 mg Oral Daily  . busPIRone  7.5 mg Oral BID  . Chlorhexidine Gluconate Cloth  6 each Topical Q0600   . cinacalcet  30 mg Oral Q breakfast  . colchicine  0.6 mg Oral Q T,Th,Sat-1800  . darbepoetin (ARANESP) injection - DIALYSIS  100 mcg Intravenous Q Sat-HD  . doxercalciferol  1 mcg Intravenous Q M,W,F-HD  . heparin  5,000 Units Subcutaneous Q8H  . insulin aspart  0-9 Units Subcutaneous TID WC  . insulin glargine  15 Units Subcutaneous Daily  . losartan  25 mg Oral Daily  . PARoxetine  20 mg Oral Daily  . sodium chloride flush  3 mL Intravenous Q12H  . sodium chloride flush  3 mL Intravenous Q12H   sodium chloride, sodium chloride, sodium chloride, acetaminophen **OR** acetaminophen, albuterol, ALPRAZolam, alteplase, bisacodyl, calcium carbonate, heparin, heparin, hydrALAZINE, HYDROcodone-acetaminophen, lidocaine-prilocaine, ondansetron **OR** ondansetron (ZOFRAN) IV, polyethylene glycol, sodium chloride flush, traZODone  Assessment/ Plan:   End-stage renal disease usually Monday Wednesday Friday dialysis.  Dialysis 12/18/2018 she has a right IJ dialysis cath follow-up by Dr. Lowanda Foster she dialyzes [facility]  She is usually Monday Wednesday Friday.  She is off schedule we can dialyze 12/20/2018 to keep her on schedule  Hypertension/volume difficult to assess secondary to body habitus 2 L removed 12/16/2018  Bones continue Hectorol Sensipar follow calcium phosphorus  Anemia continue darbepoetin.  Status post ORIF right trimalleolar fracture appreciated assistance of Dr. Daylene Katayama  Diabetes mellitus per primary team  Sleep apnea per primary team  Hyperlipidemia continues on statin.     LOS: [place] @TODAY @10 :14 AM

## 2018-12-19 NOTE — Progress Notes (Signed)
Pt's been complaining of extreme leg pain and tightness on affected extremity, on ace wrap and cam boot. Assessed affected leg and toes with Amy, RN. Leg and toes warm to touch, redness observed on right lower leg, capillary refill to the digits within normal limits, able to move and curl toes, able to push against resistance with feet, slightly loosened velcro straps per pt's request. Given 1 tab of Norco, tolerated well and willl continue to monitor.

## 2018-12-19 NOTE — Discharge Summary (Addendum)
Physician Discharge Summary  Martha [name] NTI:[id number] DOB: Dec 21, 1965 DOA: 12/16/2018  PCP: Sinda Du, MD  Admit date: 12/16/2018 Discharge date: 12/19/2018  Admitted From: SNF Disposition:  SNF  Recommendations for Outpatient Follow-up:  1. Follow up with PCP in 1-2 weeks 2. Follow up with Podiatry in 1 week 3. Continue MWF dialysis, next dialysis on 5/25 4. Per Podiatrist:               -Strict nonweightbearing surgical extremity               -Keep dressings clean dry and intact until follow-up in the office next week               -Follow-up in office in 1 week.    Discharge Condition:Stable CODE STATUS:Full Diet recommendation: Diabetic, renal   Brief/Interim Summary: 53 y.o.femalewith past medical history relevant for morbid obesity, OSA on CPAP, ESRDwith HD usually on Mondays Wednesdays and Fridays, bipolar disorder and depression, history of medication lifestyle noncompliance, diabetes and hypertension,as well as recent rightAnkletrimalleolar fracture(Rttrimalleolar fracture of the ankle--- DOI 11/29/2018, for ORIFby Dr. Daylene Katayama at [place] on 12/17/2018----transfer to [place].Marland KitchenMarland KitchenFurther management per podiatric team  Patient apparently has been staying at [facility] in [place].Marland KitchenMarland KitchenMissed hemodialysis on 12/15/2018,scheduled for ORIF at [facility] for right trimalleolar ankle fracture on 12/17/2018...  Presented to [facility] 12/16/2018---received hemodialysis on 12/16/2018.Marland KitchenMarland KitchenMarland KitchenNow being transferred to [place] for previously scheduled orthopedic/podiatric surgery  Denies chest pains, palpitations, no fevers, no chills,  No URI symptoms, no UTI symptoms  COVID 19 test is Negative   Discharge Diagnoses:  Principal Problem:   Trimalleolar fracture of ankle, closed, right, initial encounter Active Problems:   Morbid obesity ([place])   Depression with anxiety   Essential hypertension   Sleep apnea    Anemia   Bipolar 1 disorder (HCC)   Noncompliance with medication regimen   Dialysis patient ([place])   ESRD (end stage renal disease) on dialysis [facility])   History of enucleation of left eyeball   Pain and swelling of ankle, right   1)Rttrimalleolar fracture of the ankle--- DOI 11/29/2018, for ORIFby Dr. Daylene Katayama at [place] on 12/17/2018 -Dr. Amalia Hailey following. Pt s/p surgery 5/22 -PT consulted with recommendation for SNF  2)ESRD---usually gets HD on Mondays Wednesdays and Fridays, patient missed HD on 12/15/2018 -Pt underwent [facility] to [facility] -had been continued torsemide 100 mg daily per home regimen -Nephrology following with plan for HD today  3)HTN -Continued on losartan 25 mg daily -Currently stable and controlled  4)depression/anxiety/bipolar disorder----this impacts patient's ability to be compliant,continue Paxil 20 mg daily, Xanax and buspirone -Seems stable this AM  5)DM2-A1c was 8.8 about 6 months ago reflecting uncontrolled diabetes, decrease Lantus to use insulin to 15 units daily from 30 units as patient will be n.p.o. for right ankle surgery.  -Continue SSI coverage -Glucose trends stable, reviewed  6)chronic anemia of CKD -hemoglobin currently stable -would cont EPO  agent as per nephrology team  7)morbid obesity -BMI over 50 -Encourage diet/lifestyle modification  8)OSA -compliance with CPAP emphasized -Pt is using CPAP in hospital   Discharge Instructions   Allergies as of 12/19/2018      Reactions   Codeine Nausea And Vomiting   Tape Rash, Other (See Comments)   Pull skin off.  Paper tape is ok      Medication List    STOP taking these medications  torsemide 100 MG tablet Commonly known as:  DEMADEX     TAKE these medications   acetaminophen 325 MG tablet Commonly known as:  TYLENOL Take 2 tablets (650 mg total) by mouth every 6 (six) hours as needed for mild pain (or Fever  >/= 101).   Admelog SoloStar 100 UNIT/ML KwikPen Generic drug:  insulin lispro Inject 1-8 Units into the skin at bedtime. Sliding Scale Insulin: 080-200=1 unit, 201-250=3 units, 251-300=5 units, 301-350=6 units, & 351-400=8 units, if greater than 400 notify MD   ALPRAZolam 0.25 MG tablet Commonly known as:  XANAX Take 1 tablet (0.25 mg total) by mouth 3 (three) times daily as needed for anxiety. Prescription needed for SNF admission What changed:    medication strength  how much to take  when to take this  reasons to take this  additional instructions  Another medication with the same name was removed. Continue taking this medication, and follow the directions you see here.   atorvastatin 20 MG tablet Commonly known as:  LIPITOR Take 20 mg by mouth daily.   bisacodyl 10 MG suppository Commonly known as:  DULCOLAX Place 1 suppository (10 mg total) rectally daily as needed for moderate constipation.   busPIRone 7.5 MG tablet Commonly known as:  BUSPAR Take 7.5 mg by mouth 2 (two) times daily.   calcium carbonate 750 MG chewable tablet Commonly known as:  TUMS EX Chew 2 tablets by mouth See admin instructions. Take 2 tablets by mouth every 8 hours as needed for gerd & Take 2 tablets with each meal for gerd   cinacalcet 30 MG tablet Commonly known as:  SENSIPAR Take 30 mg by mouth daily.   colchicine 0.6 MG tablet Take 1 tablet (0.6 mg total) by mouth every Tuesday, Thursday, and Saturday at 6 PM. Take 1 tablet every other day What changed:    when to take this  additional instructions   diphenhydrAMINE 25 mg capsule Commonly known as:  BENADRYL Take 1 capsule (25 mg total) by mouth every 6 (six) hours as needed for itching.   doxercalciferol 4 MCG/2ML injection Commonly known as:  HECTOROL Inject 0.5 mLs (1 mcg total) into the vein every Monday, Wednesday, and Friday with hemodialysis.   epoetin alfa 10000 UNIT/ML injection Commonly known as:  EPOGEN Inject 1  mL (10,000 Units total) into the vein 3 (three) times a week.   HYDROcodone-acetaminophen 10-325 MG tablet Commonly known as:  NORCO Take 1 tablet by mouth every 6 (six) hours as needed (pain.). Prescription needed for SNF admission What changed:    reasons to take this  additional instructions   insulin aspart 100 UNIT/ML injection Commonly known as:  novoLOG Inject 0-9 Units into the skin 3 (three) times daily with meals.   Basaglar KwikPen 100 UNIT/ML Sopn Inject 30 Units into the skin daily.   Insulin Glargine 100 UNIT/ML Solostar Pen Commonly known as:  Lantus SoloStar Inject 30 Units into the skin every morning. And pen needles 1/day   lidocaine-prilocaine cream Commonly known as:  EMLA Apply 1 application topically as needed (topical anesthesia for hemodialysis if Gebauers and Lidocaine injection are ineffective.). What changed:    when to take this  reasons to take this   losartan 25 MG tablet Commonly known as:  COZAAR Take 25 mg by mouth daily.   ondansetron 4 MG tablet Commonly known as:  ZOFRAN Take 1 tablet (4 mg total) by mouth every 6 (six) hours as needed for nausea.   PARoxetine 20  MG tablet Commonly known as:  Paxil Take 1 daily. This is to prevent panic attacks What changed:    how much to take  how to take this  when to take this  additional instructions   polyethylene glycol 17 g packet Commonly known as:  MIRALAX / GLYCOLAX Take 17 g by mouth daily as needed for mild constipation.      Follow-up Information    Sinda Du, MD. Schedule an appointment as soon as possible for a visit in 2 week(s).   Specialty:  Pulmonary Disease Contact information: [address] [place] [address] [phone]        Edrick Kins, DPM. Schedule an appointment as soon as possible for a visit in 1 week(s).   Specialty:  Podiatry Contact information: [address] [place] [address] [phone]           Allergies  Allergen Reactions  . Codeine Nausea And Vomiting  . Tape Rash and Other (See Comments)    Pull skin off.  Paper tape is ok    Consultations:  Podiatry  Procedures/Studies: Dg Ankle Complete Right  Result Date: 12/18/2018 CLINICAL DATA:  Pain.  ORIF EXAM: RIGHT ANKLE - COMPLETE 3+ VIEW COMPARISON:  11/29/2018 FINDINGS: The patient has undergone ORIF of the right ankle. There is plate and screw fixation of the distal fibula and a transcortical screw through the medial malleolus. The alignment is significantly improved. Overlying skin staples are noted. There is soft tissue swelling. A moderate-sized plantar calcaneal spur and Achilles tendon enthesophyte are again noted. IMPRESSION: Improved osseous alignment status post ORIF of the right ankle. Electronically Signed   By: Constance Holster M.D.   On: 12/18/2018 19:24   Dg Ankle Complete Right  Result Date: 11/29/2018 CLINICAL DATA:  Fall this morning with swelling and bruising to the right ankle EXAM: RIGHT ANKLE - COMPLETE 3+ VIEW COMPARISON:  None FINDINGS: Oblique fracture through the distal fibular shaft. There is a transverse fracture through the medial malleolus. Widening of the medial clear space. Posterior malleolus fracture is present and retracted proximally. Located hindfoot.  Heel spurs. IMPRESSION: Trimalleolar fracture with widening of the medial clear space. Electronically Signed   By: Monte Fantasia M.D.   On: 11/29/2018 07:28   Ct Head Wo Contrast  Result Date: 11/29/2018 CLINICAL DATA:  Blunt maxillofacial trauma EXAM: CT HEAD WITHOUT CONTRAST CT CERVICAL SPINE WITHOUT CONTRAST TECHNIQUE: Multidetector CT imaging of the head and cervical spine was performed following the standard protocol without intravenous contrast. Multiplanar CT image reconstructions of the cervical spine were also generated. COMPARISON:  Head CT 11/28/2018.  Cervical spine CT 06/21/2015 FINDINGS: CT HEAD FINDINGS Brain: No evidence of acute  infarction, hemorrhage, hydrocephalus, extra-axial collection or mass lesion/mass effect. Vascular: No hyperdense vessel or unexpected calcification. Skull: Negative Sinuses/Orbits: Left nucleation with prosthesis. Right-sided oil tamponade. CT CERVICAL SPINE FINDINGS Alignment: Normal Skull base and vertebrae: Negative for acute fracture Soft tissues and spinal canal: No prevertebral fluid or swelling. No visible canal hematoma. 22 mm left thyroid nodule without worrisome growth since cervical spine CT in 2016. Disc levels:  No notable degenerative changes Upper chest: Negative IMPRESSION: No evidence of acute intracranial or cervical spine injury. Electronically Signed   By: Monte Fantasia M.D.   On: 11/29/2018 07:41   Ct Head Wo Contrast  Result Date: 11/28/2018 CLINICAL DATA:  Jerking and tremors that began today, end-stage renal disease on dialysis, CHF, type II diabetes mellitus, hypertension, COPD EXAM:  CT HEAD WITHOUT CONTRAST TECHNIQUE: Contiguous axial images were obtained from the base of the skull through the vertex without intravenous contrast. Sagittal and coronal MPR images reconstructed from axial data set. COMPARISON:  09/26/2018 FINDINGS: Brain: Normal ventricular morphology. No midline shift or mass effect. Normal appearance of brain parenchyma. No intracranial hemorrhage, mass lesion or evidence of acute infarction. No extra-axial fluid collections. Vascular: No abnormalities Skull: Intact Sinuses/Orbits: Clear. 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 Chronic increased attenuation of the RIGHT optic globe again noted. IMPRESSION: No acute intracranial mallet. Electronically Signed   By: Lavonia Dana M.D.   On: 11/28/2018 19:58   Ct Cervical Spine Wo Contrast  Result Date: 11/29/2018 CLINICAL DATA:  Blunt maxillofacial trauma EXAM: CT HEAD WITHOUT CONTRAST CT CERVICAL SPINE WITHOUT CONTRAST TECHNIQUE: Multidetector CT imaging of the head and  cervical spine was performed following the standard protocol without intravenous contrast. Multiplanar CT image reconstructions of the cervical spine were also generated. COMPARISON:  Head CT 11/28/2018.  Cervical spine CT 06/21/2015 FINDINGS: CT HEAD FINDINGS Brain: No evidence of acute infarction, hemorrhage, hydrocephalus, extra-axial collection or mass lesion/mass effect. Vascular: No hyperdense vessel or unexpected calcification. Skull: Negative Sinuses/Orbits: Left nucleation with prosthesis. Right-sided oil tamponade. CT CERVICAL SPINE FINDINGS Alignment: Normal Skull base and vertebrae: Negative for acute fracture Soft tissues and spinal canal: No prevertebral fluid or swelling. No visible canal hematoma. 22 mm left thyroid nodule without worrisome growth since cervical spine CT in 2016. Disc levels:  No notable degenerative changes Upper chest: Negative IMPRESSION: No evidence of acute intracranial or cervical spine injury. Electronically Signed   By: Monte Fantasia M.D.   On: 11/29/2018 07:41   Dg Chest Port 1 View  Result Date: 11/29/2018 CLINICAL DATA:  Shortness of breath, weakness, history hypertension, CHF, COPD, former smoker EXAM: PORTABLE CHEST 1 VIEW COMPARISON:  Portable exam 1939 hours compared to 09/28/2018 FINDINGS: RIGHT jugular line with tip projecting over SVC. Enlargement of cardiac silhouette with pulmonary vascular congestion. Mediastinal contours normal. Lungs clear. No pleural effusion or pneumothorax. Bones unremarkable. IMPRESSION: Enlargement of cardiac silhouette with pulmonary vascular congestion. No acute infiltrate. Electronically Signed   By: Lavonia Dana M.D.   On: 11/29/2018 19:46    Subjective: Without complaints this AM  Discharge Exam: Vitals:   12/18/18 2141 12/19/18 0534  BP: (!) 103/48 119/63  Pulse: 81 76  Resp: 20 18  Temp: 98 F (36.7 C) 97.9 F (36.6 C)  SpO2: (!) 85% 98%   Vitals:   12/18/18 1651 12/18/18 2141 12/19/18 0500 12/19/18 0534  BP:   (!) 103/48  119/63  Pulse:  81  76  Resp:  20  18  Temp:  98 F (36.7 C)  97.9 F (36.6 C)  TempSrc:  Oral  Oral  SpO2:  (!) 85%  98%  Weight: (!) 178.2 kg  (!) 178.5 kg   Height:        General: Pt is alert, awake, not in acute distress Cardiovascular: RRR, S1/S2 +, no rubs, no gallops Respiratory: CTA bilaterally, no wheezing, no rhonchi Abdominal: Soft, NT, ND, bowel sounds + Extremities: no edema, no cyanosis   The results of significant diagnostics from this hospitalization (including imaging, microbiology, ancillary and laboratory) are listed below for reference.     Microbiology: Recent Results (from the past 240 hour(s))  SARS Coronavirus 2 (CEPHEID - Performed in [facility] hospital lab), Hosp Order     Status: None  Collection Time: 12/16/18  9:00 PM  Result Value Ref Range Status   SARS Coronavirus 2 NEGATIVE NEGATIVE Final    Comment: (NOTE) If result is NEGATIVE SARS-CoV-2 target nucleic acids are NOT DETECTED. The SARS-CoV-2 RNA is generally detectable in upper and lower  respiratory specimens during the acute phase of infection. The lowest  concentration of SARS-CoV-2 viral copies this assay can detect is 250  copies / mL. A negative result does not preclude SARS-CoV-2 infection  and should not be used as the sole basis for treatment or other  patient management decisions.  A negative result may occur with  improper specimen collection / handling, submission of specimen other  than nasopharyngeal swab, presence of viral mutation(s) within the  areas targeted by this assay, and inadequate number of viral copies  (<250 copies / mL). A negative result must be combined with clinical  observations, patient history, and epidemiological information. If result is POSITIVE SARS-CoV-2 target nucleic acids are DETECTED. The SARS-CoV-2 RNA is generally detectable in upper and lower  respiratory specimens dur ing the acute phase of infection.  Positive  results are  indicative of active infection with SARS-CoV-2.  Clinical  correlation with patient history and other diagnostic information is  necessary to determine patient infection status.  Positive results do  not rule out bacterial infection or co-infection with other viruses. If result is PRESUMPTIVE POSTIVE SARS-CoV-2 nucleic acids MAY BE PRESENT.   A presumptive positive result was obtained on the submitted specimen  and confirmed on repeat testing.  While 2019 novel coronavirus  (SARS-CoV-2) nucleic acids may be present in the submitted sample  additional confirmatory testing may be necessary for epidemiological  and / or clinical management purposes  to differentiate between  SARS-CoV-2 and other Sarbecovirus currently known to infect humans.  If clinically indicated additional testing with an alternate test  methodology [phone]) is advised. The SARS-CoV-2 RNA is generally  detectable in upper and lower respiratory sp ecimens during the acute  phase of infection. The expected result is Negative. Fact Sheet for Patients:  StrictlyIdeas.no Fact Sheet for Healthcare Providers: BankingDealers.co.za This test is not yet approved or cleared by the [place] FDA and has been authorized for detection and/or diagnosis of SARS-CoV-2 by FDA under an Emergency Use Authorization (EUA).  This EUA will remain in effect (meaning this test can be used) for the duration of the COVID-19 declaration under Section 564(b)(1) of the Act, 21 U.S.C. section 360bbb-3(b)(1), unless the authorization is terminated or revoked sooner. Performed at [facility], [address], [place], [place] [address]   MRSA PCR Screening     Status: None   Collection Time: 12/17/18  2:11 AM  Result Value Ref Range Status   MRSA by PCR NEGATIVE NEGATIVE Final    Comment:        The GeneXpert MRSA Assay (FDA approved for NASAL specimens only), is one component of a comprehensive  MRSA colonization surveillance program. It is not intended to diagnose MRSA infection nor to guide or monitor treatment for MRSA infections. Performed at [facility], [place] [address], [place], [place] [address]      Labs: BNP (last 3 results) Recent Labs    02/25/18 2019  BNP 295.6*   Basic Metabolic Panel: Recent Labs  Lab 12/16/18 1744 12/17/18 0221 12/18/18 0345 12/19/18 0301  NA 136 137 136 137  K 3.7 3.3* 3.5 3.6  CL 96* 96* 96* 97*  CO2 25 26 22 26   GLUCOSE  188* 138* 148* 130*  BUN 61* 19 30* 14  CREATININE 10.74* 5.00* 7.24* 4.50*  CALCIUM 8.7* 9.4 8.5* 8.9  PHOS  --   --  4.3 2.6   Liver Function Tests: Recent Labs  Lab 12/18/18 0345 12/19/18 0301  ALBUMIN 2.9* 2.8*   No results for input(s): LIPASE, AMYLASE in the last 168 hours. No results for input(s): AMMONIA in the last 168 hours. CBC: Recent Labs  Lab 12/16/18 1744 12/16/18 2243 12/17/18 0221 12/18/18 1408  WBC 6.8  --  6.6 8.5  NEUTROABS 4.1  --   --   --   HGB 7.3* 8.6* 8.5* 8.7*  HCT 24.7* 28.6* 26.6* 28.0*  MCV 93.2  --  87.2 90.6  PLT 155  --  135* 160   Cardiac Enzymes: No results for input(s): CKTOTAL, CKMB, CKMBINDEX, TROPONINI in the last 168 hours. BNP: Invalid input(s): POCBNP CBG: Recent Labs  Lab 12/18/18 0824 12/18/18 1748 12/18/18 2137 12/19/18 0736 12/19/18 1141  GLUCAP 135* 154* 162* 131* 115*   D-Dimer No results for input(s): DDIMER in the last 72 hours. Hgb A1c No results for input(s): HGBA1C in the last 72 hours. Lipid Profile No results for input(s): CHOL, HDL, LDLCALC, TRIG, CHOLHDL, LDLDIRECT in the last 72 hours. Thyroid function studies No results for input(s): TSH, T4TOTAL, T3FREE, THYROIDAB in the last 72 hours.  Invalid input(s): FREET3 Anemia work up Recent Labs    12/17/18 1049  TIBC 174*  IRON 37   Urinalysis    Component Value Date/Time   COLORURINE YELLOW 11/20/2018 0213   APPEARANCEUR TURBID (A) 11/20/2018 0213   LABSPEC  1.018 11/20/2018 0213   PHURINE 5.0 11/20/2018 0213   GLUCOSEU >=500 (A) 11/20/2018 0213   HGBUR SMALL (A) 11/20/2018 0213   BILIRUBINUR NEGATIVE 11/20/2018 0213   KETONESUR NEGATIVE 11/20/2018 0213   PROTEINUR 100 (A) 11/20/2018 0213   UROBILINOGEN 0.2 03/17/2015 2236   NITRITE NEGATIVE 11/20/2018 0213   LEUKOCYTESUR TRACE (A) 11/20/2018 0213   Sepsis Labs Invalid input(s): PROCALCITONIN,  WBC,  LACTICIDVEN Microbiology Recent Results (from the past 240 hour(s))  SARS Coronavirus 2 (CEPHEID - Performed in [facility] hospital lab), Hosp Order     Status: None   Collection Time: 12/16/18  9:00 PM  Result Value Ref Range Status   SARS Coronavirus 2 NEGATIVE NEGATIVE Final    Comment: (NOTE) If result is NEGATIVE SARS-CoV-2 target nucleic acids are NOT DETECTED. The SARS-CoV-2 RNA is generally detectable in upper and lower  respiratory specimens during the acute phase of infection. The lowest  concentration of SARS-CoV-2 viral copies this assay can detect is 250  copies / mL. A negative result does not preclude SARS-CoV-2 infection  and should not be used as the sole basis for treatment or other  patient management decisions.  A negative result may occur with  improper specimen collection / handling, submission of specimen other  than nasopharyngeal swab, presence of viral mutation(s) within the  areas targeted by this assay, and inadequate number of viral copies  (<250 copies / mL). A negative result must be combined with clinical  observations, patient history, and epidemiological information. If result is POSITIVE SARS-CoV-2 target nucleic acids are DETECTED. The SARS-CoV-2 RNA is generally detectable in upper and lower  respiratory specimens dur ing the acute phase of infection.  Positive  results are indicative of active infection with SARS-CoV-2.  Clinical  correlation with patient history and other diagnostic information is  necessary to determine patient infection  status.  Positive results do  not rule out bacterial infection or co-infection with other viruses. If result is PRESUMPTIVE POSTIVE SARS-CoV-2 nucleic acids MAY BE PRESENT.   A presumptive positive result was obtained on the submitted specimen  and confirmed on repeat testing.  While 2019 novel coronavirus  (SARS-CoV-2) nucleic acids may be present in the submitted sample  additional confirmatory testing may be necessary for epidemiological  and / or clinical management purposes  to differentiate between  SARS-CoV-2 and other Sarbecovirus currently known to infect humans.  If clinically indicated additional testing with an alternate test  methodology [phone]) is advised. The SARS-CoV-2 RNA is generally  detectable in upper and lower respiratory sp ecimens during the acute  phase of infection. The expected result is Negative. Fact Sheet for Patients:  StrictlyIdeas.no Fact Sheet for Healthcare Providers: BankingDealers.co.za This test is not yet approved or cleared by the [place] FDA and has been authorized for detection and/or diagnosis of SARS-CoV-2 by FDA under an Emergency Use Authorization (EUA).  This EUA will remain in effect (meaning this test can be used) for the duration of the COVID-19 declaration under Section 564(b)(1) of the Act, 21 U.S.C. section 360bbb-3(b)(1), unless the authorization is terminated or revoked sooner. Performed at [facility], [address] [place], [place] [address]   MRSA PCR Screening     Status: None   Collection Time: 12/17/18  2:11 AM  Result Value Ref Range Status   MRSA by PCR NEGATIVE NEGATIVE Final    Comment:        The GeneXpert MRSA Assay (FDA approved for NASAL specimens only), is one component of a comprehensive MRSA colonization surveillance program. It is not intended to diagnose MRSA infection nor to guide or monitor treatment for MRSA infections. Performed at [facility], [place] [address] [place], [place] [address]    Time spent: 30 min  SIGNED:   Marylu Lund, MD  [facility] 12/19/2018, 12:03 PM  If 7PM-7AM, please contact night-coverage

## 2018-12-19 NOTE — Progress Notes (Addendum)
Clinical Social Worker facilitated patient discharge including contacting patient family and facility to confirm patient discharge plans.  Clinical information faxed to facility and family agreeable with plan. CSW arranged ambulance transport via [facility] to [facility]. RN to call for report prior to discharge ([phone]).  Clinical Social Worker will sign off for now as social work intervention is no longer needed. Please consult [place] again if new need arises.  Mahamad Dominique Ressel LCSW [phone]

## 2018-12-21 ENCOUNTER — Encounter (HOSPITAL_COMMUNITY): Payer: Self-pay | Admitting: Podiatry

## 2018-12-21 LAB — HEPATITIS B CORE ANTIBODY, TOTAL: Hep B Core Total Ab: NEGATIVE

## 2018-12-21 LAB — HEPATITIS B SURFACE ANTIBODY,QUALITATIVE: Hep B S Ab: REACTIVE

## 2018-12-22 ENCOUNTER — Encounter: Payer: Self-pay | Admitting: Podiatry

## 2018-12-22 ENCOUNTER — Other Ambulatory Visit: Payer: Medicaid Other

## 2018-12-23 ENCOUNTER — Other Ambulatory Visit: Payer: Self-pay

## 2018-12-23 ENCOUNTER — Ambulatory Visit (INDEPENDENT_AMBULATORY_CARE_PROVIDER_SITE_OTHER): Payer: Medicaid Other | Admitting: Podiatry

## 2018-12-23 ENCOUNTER — Ambulatory Visit (INDEPENDENT_AMBULATORY_CARE_PROVIDER_SITE_OTHER): Payer: Medicaid Other

## 2018-12-23 DIAGNOSIS — Z09 Encounter for follow-up examination after completed treatment for conditions other than malignant neoplasm: Secondary | ICD-10-CM

## 2018-12-23 DIAGNOSIS — S82851D Displaced trimalleolar fracture of right lower leg, subsequent encounter for closed fracture with routine healing: Secondary | ICD-10-CM

## 2018-12-23 NOTE — Progress Notes (Signed)
Subjective:  Patient ID: Martha [name], female    DOB: [birth year],  MRN: [id number]  Chief Complaint  Patient presents with  . Routine Post Op     DOS 12/17/2018 ORIF ANKLE TRIMALLEOLAR FX RT    DOS: 12/17/2018 Procedure: Rt Trimalleolar Ankle Fracture  53 y.o. female returns for post-op check. Doing ok having some pain denies post-op problems or complaints.   Review of Systems: Negative except as noted in the HPI. Denies N/V/F/Ch.  Past Medical History:  Diagnosis Date  . Acid reflux    takes Tums  . Anemia   . Arthritis   . Bipolar 1 disorder ([place])   . Blindness of left eye   . Carpal tunnel syndrome, bilateral   . Cervical radiculopathy   . CHF (congestive heart failure) ([place])   . Chronic kidney disease    Stage 5- 01/25/17  . Constipation   . COPD (chronic obstructive pulmonary disease) ([place]) 2014   bronchitis  . Degenerative disc disease, thoracic   . Depression   . Diabetes mellitus    Type II  . Diabetic retinopathy ([place])   . Dyspnea    when walking  . End stage kidney disease ([place])    M, W, F [facility]  . Hypertension   . Noncompliance with medication regimen   . Noncompliance with medication regimen   . Obesity (BMI 30-39.9)   . OSA (obstructive sleep apnea)    cpap  . Panic attack   . RLS (restless legs syndrome)     Current Outpatient Medications:  .  acetaminophen (TYLENOL) 325 MG tablet, Take 2 tablets (650 mg total) by mouth every 6 (six) hours as needed for mild pain (or Fever >/= 101). (Patient not taking: Reported on 12/14/2018), Disp: , Rfl:  .  ADMELOG SOLOSTAR 100 UNIT/ML KwikPen, Inject 1-8 Units into the skin at bedtime. Sliding Scale Insulin: 080-200=1 unit, 201-250=3 units, 251-300=5 units, 301-350=6 units, & 351-400=8 units, if greater than 400 notify MD, Disp: , Rfl:  .  ALPRAZolam (XANAX) 0.25 MG tablet, Take 1 tablet (0.25 mg total) by mouth 3 (three) times daily as needed for anxiety. Prescription needed for SNF admission, Disp:  6 tablet, Rfl: 0 .  atorvastatin (LIPITOR) 20 MG tablet, Take 20 mg by mouth daily. , Disp: , Rfl:  .  bisacodyl (DULCOLAX) 10 MG suppository, Place 1 suppository (10 mg total) rectally daily as needed for moderate constipation., Disp: 12 suppository, Rfl: 0 .  busPIRone (BUSPAR) 7.5 MG tablet, Take 7.5 mg by mouth 2 (two) times daily., Disp: , Rfl:  .  calcium carbonate (TUMS EX) 750 MG chewable tablet, Chew 2 tablets by mouth See admin instructions. Take 2 tablets by mouth every 8 hours as needed for gerd & Take 2 tablets with each meal for gerd, Disp: , Rfl:  .  cinacalcet (SENSIPAR) 30 MG tablet, Take 30 mg by mouth daily., Disp: , Rfl:  .  colchicine 0.6 MG tablet, Take 1 tablet (0.6 mg total) by mouth every Tuesday, Thursday, and Saturday at 6 PM. Take 1 tablet every other day (Patient taking differently: Take 0.6 mg by mouth every other day. ), Disp: , Rfl:  .  diphenhydrAMINE (BENADRYL) 25 mg capsule, Take 1 capsule (25 mg total) by mouth every 6 (six) hours as needed for itching. (Patient not taking: Reported on 12/14/2018), Disp: 30 capsule, Rfl: 0 .  doxercalciferol (HECTOROL) 4 MCG/2ML injection, Inject 0.5 mLs (1 mcg total) into the vein every Monday, Wednesday, and  Friday with hemodialysis., Disp: 2 mL, Rfl:  .  epoetin alfa (EPOGEN) 10000 UNIT/ML injection, Inject 1 mL (10,000 Units total) into the vein 3 (three) times a week., Disp: 1 mL, Rfl:  .  HYDROcodone-acetaminophen (NORCO) 10-325 MG tablet, Take 1 tablet by mouth every 6 (six) hours as needed (pain.). Prescription needed for SNF admission, Disp: 6 tablet, Rfl: 0 .  insulin aspart (NOVOLOG) 100 UNIT/ML injection, Inject 0-9 Units into the skin 3 (three) times daily with meals. (Patient not taking: Reported on 12/14/2018), Disp: 10 mL, Rfl: 11 .  Insulin Glargine (BASAGLAR KWIKPEN) 100 UNIT/ML SOPN, Inject 30 Units into the skin daily., Disp: , Rfl:  .  Insulin Glargine (LANTUS SOLOSTAR) 100 UNIT/ML Solostar Pen, Inject 30 Units into  the skin every morning. And pen needles 1/day, Disp: 15 mL, Rfl: 11 .  lidocaine-prilocaine (EMLA) cream, Apply 1 application topically as needed (topical anesthesia for hemodialysis if Gebauers and Lidocaine injection are ineffective.). (Patient taking differently: Apply 1 application topically daily as needed (dialysis.). ), Disp: 30 g, Rfl: 0 .  losartan (COZAAR) 25 MG tablet, Take 25 mg by mouth daily., Disp: , Rfl:  .  ondansetron (ZOFRAN) 4 MG tablet, Take 1 tablet (4 mg total) by mouth every 6 (six) hours as needed for nausea., Disp: 20 tablet, Rfl: 0 .  PARoxetine (PAXIL) 20 MG tablet, Take 1 daily. This is to prevent panic attacks (Patient taking differently: Take 20 mg by mouth daily. This is to prevent panic attacks), Disp: 30 tablet, Rfl: 12 .  polyethylene glycol (MIRALAX / GLYCOLAX) 17 g packet, Take 17 g by mouth daily as needed for mild constipation., Disp: 14 each, Rfl: 0  Social History   Tobacco Use  Smoking Status Former Smoker  . Packs/day: 1.50  . Years: 25.00  . Pack years: 37.50  . Types: Cigarettes  . Last attempt to quit: 10/23/2007  . Years since quitting: 11.1  Smokeless Tobacco Never Used    Allergies  Allergen Reactions  . Codeine Nausea And Vomiting  . Tape Rash and Other (See Comments)    Pull skin off.  Paper tape is ok   Objective:  There were no vitals filed for this visit. There is no height or weight on file to calculate BMI. Constitutional Well developed. Well nourished.  Vascular Foot warm and well perfused. Capillary refill normal to all digits.   Neurologic Normal speech. Oriented to person, place, and time. Epicritic sensation to light touch grossly present bilaterally.  Dermatologic Skin healing well without signs of infection. Skin edges well coapted without signs of infection. Dorsal midfoot pressure injury noted. Serous drainage from incisions.  Orthopedic: Tenderness to palpation noted about the surgical sites.   Radiographs:  Suboptimal views patient unable to position for XRs. Hardware appears intact. Unable to fully evaluate fracture alignment on the obtained views. Immediate post-operative views show good fracture alignment. Assessment:   1. Closed trimalleolar fracture of right ankle with routine healing, subsequent encounter   2. Surgery follow-up    Plan:  Patient was evaluated and treated and all questions answered.  S/p foot surgery right -Progressing as expected post-operatively. -XR: Could not be performed - patient could not be positioned with XR tubehead for appropriate views. Ordered to be performed at patient's facility. Additional orders placed to be performed along with each visit at our office since they will likely not able to be performed here until she is able to bear weight. -WB Status: NWB to the RLE. -Sutures:  intact. -Medications: None refilled today. -Foot redressed. Well padded over all bony prominences.  No follow-ups on file.

## 2018-12-24 ENCOUNTER — Telehealth: Payer: Self-pay

## 2018-12-24 NOTE — Telephone Encounter (Signed)
LOV 06/29/18. Per Dr. Loanne Drilling, f/u 2-3 wks. Called to schedule appt. Currently in SNF. Will call back to schedule.

## 2018-12-27 LAB — HEPATITIS B SURFACE ANTIGEN: Hepatitis B Surface Ag: NEGATIVE

## 2018-12-29 ENCOUNTER — Ambulatory Visit: Payer: Medicaid Other

## 2018-12-29 ENCOUNTER — Encounter: Payer: Self-pay | Admitting: Podiatry

## 2018-12-29 ENCOUNTER — Other Ambulatory Visit: Payer: Self-pay

## 2018-12-29 ENCOUNTER — Ambulatory Visit (INDEPENDENT_AMBULATORY_CARE_PROVIDER_SITE_OTHER): Payer: Medicaid Other | Admitting: Podiatry

## 2018-12-29 VITALS — Temp 97.2°F

## 2018-12-29 DIAGNOSIS — S82851D Displaced trimalleolar fracture of right lower leg, subsequent encounter for closed fracture with routine healing: Secondary | ICD-10-CM

## 2018-12-29 DIAGNOSIS — Z8781 Personal history of (healed) traumatic fracture: Secondary | ICD-10-CM

## 2018-12-29 DIAGNOSIS — Z9889 Other specified postprocedural states: Secondary | ICD-10-CM

## 2019-01-01 NOTE — Progress Notes (Signed)
   Subjective:  Patient presents today status post ORIF right ankle fracture. DOS: 12/17/2018. She states she is doing well. She denies any significant pain or modifying factors. Patient is here for further evaluation and treatment.    Past Medical History:  Diagnosis Date  . Acid reflux    takes Tums  . Anemia   . Arthritis   . Bipolar 1 disorder ([place])   . Blindness of left eye   . Carpal tunnel syndrome, bilateral   . Cervical radiculopathy   . CHF (congestive heart failure) ([place])   . Chronic kidney disease    Stage 5- 01/25/17  . Constipation   . COPD (chronic obstructive pulmonary disease) ([place]) 2014   bronchitis  . Degenerative disc disease, thoracic   . Depression   . Diabetes mellitus    Type II  . Diabetic retinopathy ([place])   . Dyspnea    when walking  . End stage kidney disease ([place])    M, W, F [facility]  . Hypertension   . Noncompliance with medication regimen   . Noncompliance with medication regimen   . Obesity (BMI 30-39.9)   . OSA (obstructive sleep apnea)    cpap  . Panic attack   . RLS (restless legs syndrome)       Objective/Physical Exam Neurovascular status intact.  Skin incisions appear to be well coapted with sutures and staples intact. No sign of infectious process noted. No dehiscence. No active bleeding noted. Moderate edema noted to the surgical extremity.  Radiographic Exam:  Trimalleolar fracture is in satisfactory position. There is a single screw in the medial malleolus. There is a lateral side plate attached with multiple screws of the distal fibula.   Fractures in satisfactory alignment, hardware in place.   Assessment: 1. s/p ORIF right ankle fracture. DOS: 12/17/2018   Plan of Care:  1. Patient was evaluated. X-rays from facility reviewed 2. Dressing changed. Keep clean, dry and intact for one week.  3. Continue nonweightbearing in CAM boot with wheelchair.  4. Return to clinic in 2 weeks.    Edrick Kins, DPM [facility]  Dr. Edrick Kins, [address]                                        [place], [place] [address]                Office [phone]  Fax [phone]

## 2019-01-12 ENCOUNTER — Ambulatory Visit (INDEPENDENT_AMBULATORY_CARE_PROVIDER_SITE_OTHER): Payer: Medicaid Other | Admitting: Podiatry

## 2019-01-12 ENCOUNTER — Encounter: Payer: Self-pay | Admitting: Podiatry

## 2019-01-12 ENCOUNTER — Other Ambulatory Visit: Payer: Self-pay

## 2019-01-12 VITALS — Temp 97.4°F

## 2019-01-12 DIAGNOSIS — Z9889 Other specified postprocedural states: Secondary | ICD-10-CM

## 2019-01-12 DIAGNOSIS — Z8781 Personal history of (healed) traumatic fracture: Secondary | ICD-10-CM

## 2019-01-12 DIAGNOSIS — S82851D Displaced trimalleolar fracture of right lower leg, subsequent encounter for closed fracture with routine healing: Secondary | ICD-10-CM

## 2019-01-16 NOTE — Progress Notes (Signed)
   Subjective:  Patient presents today status post ORIF right ankle fracture. DOS: 12/17/2018. She states she is doing well overall but reports moderate pain that began today. She reports going to dialysis and having to move around more today. She has been nonweightbearing in the CAM boot as directed. Patient is here for further evaluation and treatment.   Past Medical History:  Diagnosis Date  . Acid reflux    takes Tums  . Anemia   . Arthritis   . Bipolar 1 disorder ([place])   . Blindness of left eye   . Carpal tunnel syndrome, bilateral   . Cervical radiculopathy   . CHF (congestive heart failure) ([place])   . Chronic kidney disease    Stage 5- 01/25/17  . Constipation   . COPD (chronic obstructive pulmonary disease) ([place]) 2014   bronchitis  . Degenerative disc disease, thoracic   . Depression   . Diabetes mellitus    Type II  . Diabetic retinopathy ([place])   . Dyspnea    when walking  . End stage kidney disease ([place])    M[facility] W[facility]  . Hypertension   . Noncompliance with medication regimen   . Noncompliance with medication regimen   . Obesity (BMI 30-39.9)   . OSA (obstructive sleep apnea)    cpap  . Panic attack   . RLS (restless legs syndrome)       Objective/Physical Exam Neurovascular status intact.  Skin incisions appear to be well coapted with sutures and staples intact. No sign of infectious process noted. No dehiscence. No active bleeding noted. Moderate edema noted to the surgical extremity.  Assessment: 1. s/p ORIF right ankle fracture. DOS: 12/17/2018   Plan of Care:  1. Patient was evaluated.  2. Staples removed today.  3. Dry sterile dressing applied.  4. Continue nonweightbearing in CAM boot.  5. Return to clinic in 4 weeks.    Edrick Kins, DPM [facility]  Dr. Edrick Kins, [address]                                        [place], [place] [address]                Office [phone]  Fax [phone]

## 2019-02-09 ENCOUNTER — Encounter: Payer: Self-pay | Admitting: Podiatry

## 2019-02-09 ENCOUNTER — Other Ambulatory Visit: Payer: Self-pay

## 2019-02-09 ENCOUNTER — Ambulatory Visit (INDEPENDENT_AMBULATORY_CARE_PROVIDER_SITE_OTHER): Payer: Medicaid Other

## 2019-02-09 ENCOUNTER — Ambulatory Visit (INDEPENDENT_AMBULATORY_CARE_PROVIDER_SITE_OTHER): Payer: Self-pay | Admitting: Podiatry

## 2019-02-09 DIAGNOSIS — S82851D Displaced trimalleolar fracture of right lower leg, subsequent encounter for closed fracture with routine healing: Secondary | ICD-10-CM | POA: Diagnosis not present

## 2019-02-09 DIAGNOSIS — Z8781 Personal history of (healed) traumatic fracture: Secondary | ICD-10-CM

## 2019-02-09 DIAGNOSIS — Z9889 Other specified postprocedural states: Secondary | ICD-10-CM

## 2019-02-12 NOTE — Progress Notes (Signed)
   Subjective:  Patient presents today status post ORIF right ankle fracture. DOS: 12/17/2018. She states she is improving well and has minimal pain. She has been able to apply a little pressure to the right lower extremity in the CAM boot. There are no modifying factors noted. Patient is here for further evaluation and treatment.   Past Medical History:  Diagnosis Date  . Acid reflux    takes Tums  . Anemia   . Arthritis   . Bipolar 1 disorder ([place])   . Blindness of left eye   . Carpal tunnel syndrome, bilateral   . Cervical radiculopathy   . CHF (congestive heart failure) ([place])   . Chronic kidney disease    Stage 5- 01/25/17  . Constipation   . COPD (chronic obstructive pulmonary disease) ([place]) 2014   bronchitis  . Degenerative disc disease, thoracic   . Depression   . Diabetes mellitus    Type II  . Diabetic retinopathy ([place])   . Dyspnea    when walking  . End stage kidney disease ([place])    M, W, F [facility]  . Hypertension   . Noncompliance with medication regimen   . Noncompliance with medication regimen   . Obesity (BMI 30-39.9)   . OSA (obstructive sleep apnea)    cpap  . Panic attack   . RLS (restless legs syndrome)       Objective/Physical Exam Neurovascular status intact.  Skin incisions appear to be well coapted. No sign of infectious process noted. No dehiscence. No active bleeding noted. Moderate edema noted to the surgical extremity.  Assessment: 1. s/p ORIF right ankle fracture. DOS: 12/17/2018   Plan of Care:  1. Patient was evaluated.  2. Begin weightbearing in CAM boot with walker for assistance.  3. Patient declined physical therapy.  4. Return to clinic in 4 weeks.    Edrick Kins, DPM [facility]  Dr. Edrick Kins, [address]                                        [place], [place] [address]                Office [phone]  Fax [phone]

## 2019-02-24 ENCOUNTER — Other Ambulatory Visit: Payer: Self-pay

## 2019-02-24 ENCOUNTER — Encounter: Payer: Self-pay | Admitting: *Deleted

## 2019-02-24 ENCOUNTER — Ambulatory Visit (INDEPENDENT_AMBULATORY_CARE_PROVIDER_SITE_OTHER): Payer: Medicaid Other | Admitting: Vascular Surgery

## 2019-02-24 ENCOUNTER — Encounter: Payer: Self-pay | Admitting: Vascular Surgery

## 2019-02-24 ENCOUNTER — Other Ambulatory Visit: Payer: Self-pay | Admitting: *Deleted

## 2019-02-24 VITALS — BP 128/72 | HR 63 | Temp 97.6°F | Resp 20 | Ht 68.0 in | Wt 393.0 lb

## 2019-02-24 DIAGNOSIS — Z992 Dependence on renal dialysis: Secondary | ICD-10-CM | POA: Diagnosis not present

## 2019-02-24 DIAGNOSIS — N186 End stage renal disease: Secondary | ICD-10-CM | POA: Diagnosis not present

## 2019-02-24 NOTE — Progress Notes (Signed)
Patient is a 53 year old female who returns for follow-up today.  She had a left for stage basilic vein fistula placed January 2020.  She had significant numbness and tingling in her hand probably secondary to at least mild steal.  However, she recently underwent carpal tunnel release by Dr. Burney Gauze and had some mild improvement of her symptoms.  She still has occasional pain in her left hand but wishes to proceed with second stage basilic fistula at this point.  She has previously had a steal in the right arm and required fistula ligation.  Duplex ultrasound of the fistula performed in February 2020 showed a diameter of 5 to 8 mm.  Her dialysis today is Monday Wednesday Friday.  Chronic medical problems remain bipolar disorder, congestive failure, end-stage renal disease, diabetes, diabetic retinopathy and neuropathy.  All of which are currently stable.  She recently had a trimalleolar fracture of her right leg and is recovering from this.  Past Medical History:  Diagnosis Date  . Acid reflux    takes Tums  . Anemia   . Arthritis   . Bipolar 1 disorder ([place])   . Blindness of left eye   . Carpal tunnel syndrome, bilateral   . Cervical radiculopathy   . CHF (congestive heart failure) ([place])   . Chronic kidney disease    Stage 5- 01/25/17  . Constipation   . COPD (chronic obstructive pulmonary disease) ([place]) 2014   bronchitis  . Degenerative disc disease, thoracic   . Depression   . Diabetes mellitus    Type II  . Diabetic retinopathy ([place])   . Dyspnea    when walking  . End stage kidney disease ([place])    M, W, F [facility]  . Hypertension   . Noncompliance with medication regimen   . Noncompliance with medication regimen   . Obesity (BMI 30-39.9)   . OSA (obstructive sleep apnea)    cpap  . Panic attack   . RLS (restless legs syndrome)    Past Surgical History:  Procedure Laterality Date  . AV FISTULA PLACEMENT Right 01/27/2017   Procedure: ARTERIOVENOUS (AV) FISTULA  CREATION-RIGHT ARM;  Surgeon: Elam Dutch, MD;  Location: [facility] OR;  Service: Vascular;  Laterality: Right;  . AV FISTULA PLACEMENT Left 08/03/2018   Procedure: LEFT ARTERIOVENOUS (AV) FISTULA CREATION;  Surgeon: Elam Dutch, MD;  Location: [place];  Service: Vascular;  Laterality: Left;  . BIOPSY N/A 05/17/2013   Procedure: BIOPSY;  Surgeon: [name], MD;  Location[facility];  Service: Endoscopy;  Laterality: N/A;  . CARPAL TUNNEL RELEASE Left 11/25/2018   Procedure: LEFT CARPAL TUNNEL RELEASE;  Surgeon: Charlotte Crumb, MD;  Location: [place];  Service: Orthopedics;  Laterality: Left;  . CHOLECYSTECTOMY N/A 04/27/2014   Procedure: LAPAROSCOPIC CHOLECYSTECTOMY;  Surgeon: [name], MD;  [facility];  Service: General;  Laterality: N/A;  . COLONOSCOPY  06/06/2010   VQQ:VZDGLO bleeding secondary to internal hemorrhoids but incomplete evaluation secondary to poor right colon prep/small rectal and sigmoid colon polyps (hyperplastic). PROPOFOL  . COLONOSCOPY  May 2013   Dr. Gilliam/[facility]: 5 mm a descending colon polyp, hyperplastic. Adequate bowel prep.  . ENDOMETRIAL ABLATION    . 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 ENUCLEATION Left 02/04/2018   Procedure: ENUCLEATION WITH PLACEMENT OF IMPLANT LEFT EYE;  Surgeon: Clista Bernhardt, MD;  Location: [place];  Service: Ophthalmology;  Laterality: Left;  . ESOPHAGOGASTRODUODENOSCOPY  06/06/2010  MGQ:[id number] erythema and edema of body of stomach, with sessile polypoid lesions. bx benign. no h.pylori  . ESOPHAGOGASTRODUODENOSCOPY (EGD) WITH PROPOFOL N/A 05/17/2013   Dr. Oneida Alar: normal esophagus, moderate nodular gastritis, negative path, empiric Savary dilation  . EYE SURGERY  07/2017   sx for glaucoma  . FLEXIBLE SIGMOIDOSCOPY N/A 05/17/2013   Dr. Oneida Alar: moderate sized internal hemorrhoids  . HEMORRHOID BANDING N/A 05/17/2013   Procedure: HEMORRHOID BANDING;  Surgeon: [name],  MD;  [facility];  Service: Endoscopy;  Laterality: N/A;  2 bands placed  . INCISION AND DRAINAGE ABSCESS Left 10/11/2013   Procedure: INCISION AND DRAINAGE AND DEBRIDEMENT LEFT BREAST  ABSCESS;  Surgeon: [name], MD;  [facility];  Service: General;  Laterality: Left;  . INSERTION OF DIALYSIS CATHETER N/A 06/08/2018   Procedure: INSERTION OF Right internal jugular TUNNELED  DIALYSIS CATHETER;  Surgeon: Angelia Mould, MD;  Location: [place];  Service: Vascular;  Laterality: N/A;  . IRRIGATION AND DEBRIDEMENT ABSCESS Right 06/01/2013   Procedure: INCISION AND DRAINAGE AND DEBRIDEMENT ABSCESS RIGHT BREAST;  Surgeon: [name], MD;  [facility];  Service: General;  Laterality: Right;  . LEFT EYE REMOVED Left 01/2018   [facility] [address].  Marland Kitchen LIGATION OF ARTERIOVENOUS  FISTULA Right 06/08/2018   Procedure: LIGATION OF ARTERIOVENOUS  FISTULA RIGHT ARM;  Surgeon: Angelia Mould, MD;  Location: [place];  Service: Vascular;  Laterality: Right;  . ORIF ANKLE FRACTURE Right 12/17/2018   Procedure: OPEN REDUCTION INTERNAL FIXATION (ORIF) ANKLE FRACTURE;  Surgeon: Edrick Kins, DPM;  Location: [place];  Service: Podiatry;  Laterality: Right;  . SAVORY DILATION N/A 05/17/2013   Procedure: SAVORY DILATION;  Surgeon: [name], MD;  [facility];  Service: Endoscopy;  Laterality: N/A;  14/15/16  . SKIN FULL THICKNESS GRAFT Left 05/05/2018   Procedure: ABDOMINAL DERMIS FAT SKIN GRAFT FULL THICKNESS LEFT EYE;  Surgeon: Clista Bernhardt, MD;  Location: [place];  Service: Ophthalmology;  Laterality: Left;  . TUBAL LIGATION      Current Outpatient Medications on File Prior to Visit  Medication Sig Dispense Refill  . acetaminophen (TYLENOL) 325 MG tablet Take 2 tablets (650 mg total) by mouth every 6 (six) hours as needed for mild pain (or Fever >/= 101).    . ADMELOG SOLOSTAR 100 UNIT/ML KwikPen Inject 1-8 Units into the skin at bedtime.  Sliding Scale Insulin: 080-200=1 unit, 201-250=3 units, 251-300=5 units, 301-350=6 units, & 351-400=8 units, if greater than 400 notify MD    . ALPRAZolam (XANAX) 0.25 MG tablet Take 1 tablet (0.25 mg total) by mouth 3 (three) times daily as needed for anxiety. Prescription needed for SNF admission 6 tablet 0  . atorvastatin (LIPITOR) 20 MG tablet Take 20 mg by mouth daily.     . bisacodyl (DULCOLAX) 10 MG suppository Place 1 suppository (10 mg total) rectally daily as needed for moderate constipation. 12 suppository 0  . busPIRone (BUSPAR) 7.5 MG tablet Take 7.5 mg by mouth 2 (two) times daily.    . calcium carbonate (TUMS EX) 750 MG chewable tablet Chew 2 tablets by mouth See admin instructions. Take 2 tablets by mouth every 8 hours as needed for gerd & Take 2 tablets with each meal for gerd    . cinacalcet (SENSIPAR) 30 MG tablet Take 30 mg by mouth daily.    . colchicine 0.6 MG tablet Take 1 tablet (0.6 mg total) by mouth every Tuesday, Thursday,  and Saturday at 6 PM. Take 1 tablet every other day (Patient taking differently: Take 0.6 mg by mouth every other day. )    . diphenhydrAMINE (BENADRYL) 25 mg capsule Take 1 capsule (25 mg total) by mouth every 6 (six) hours as needed for itching. 30 capsule 0  . doxercalciferol (HECTOROL) 4 MCG/2ML injection Inject 0.5 mLs (1 mcg total) into the vein every Monday, Wednesday, and Friday with hemodialysis. 2 mL   . epoetin alfa (EPOGEN) 10000 UNIT/ML injection Inject 1 mL (10,000 Units total) into the vein 3 (three) times a week. 1 mL   . HYDROcodone-acetaminophen (NORCO) 10-325 MG tablet Take 1 tablet by mouth every 6 (six) hours as needed (pain.). Prescription needed for SNF admission 6 tablet 0  . insulin aspart (NOVOLOG) 100 UNIT/ML injection Inject 0-9 Units into the skin 3 (three) times daily with meals. 10 mL 11  . Insulin Glargine (BASAGLAR KWIKPEN) 100 UNIT/ML SOPN Inject 30 Units into the skin daily.    . Insulin Glargine (LANTUS SOLOSTAR) 100  UNIT/ML Solostar Pen Inject 30 Units into the skin every morning. And pen needles 1/day 15 mL 11  . lidocaine-prilocaine (EMLA) cream Apply 1 application topically as needed (topical anesthesia for hemodialysis if Gebauers and Lidocaine injection are ineffective.). (Patient taking differently: Apply 1 application topically daily as needed (dialysis.). ) 30 g 0  . losartan (COZAAR) 25 MG tablet Take 25 mg by mouth daily.    . ondansetron (ZOFRAN) 4 MG tablet Take 1 tablet (4 mg total) by mouth every 6 (six) hours as needed for nausea. 20 tablet 0  . PARoxetine (PAXIL) 20 MG tablet Take 1 daily. This is to prevent panic attacks (Patient taking differently: Take 20 mg by mouth daily. This is to prevent panic attacks) 30 tablet 12  . polyethylene glycol (MIRALAX / GLYCOLAX) 17 g packet Take 17 g by mouth daily as needed for mild constipation. 14 each 0   No current facility-administered medications on file prior to visit.     Social History   Socioeconomic History  . Marital status: Significant Other    Spouse name: Not on file  . Number of children: 2  . Years of education: Not on file  . Highest education level: Not on file  Occupational History  . Occupation: Disabled  Social Needs  . Financial resource strain: Not on file  . Food insecurity    Worry: Not on file    Inability: Not on file  . Transportation needs    Medical: Not on file    Non-medical: Not on file  Tobacco Use  . Smoking status: Former Smoker    Packs/day: 1.50    Years: 25.00    Pack years: 37.50    Types: Cigarettes    Quit date: 10/23/2007    Years since quitting: 11.3  . Smokeless tobacco: Never Used  Substance and Sexual Activity  . Alcohol use: No  . Drug use: No  . Sexual activity: Yes    Birth control/protection: Surgical  Lifestyle  . Physical activity    Days per week: Not on file    Minutes per session: Not on file  . Stress: Not on file  Relationships  . Social Herbalist on phone:  Not on file    Gets together: Not on file    Attends religious service: Not on file    Active member of club or organization: Not on file    Attends meetings of clubs  or organizations: Not on file    Relationship status: Not on file  . Intimate partner violence    Fear of current or ex partner: Not on file    Emotionally abused: Not on file    Physically abused: Not on file    Forced sexual activity: Not on file  Other Topics Concern  . Not on file  Social History Narrative   Lives with fiance.       Physical exam:  Vitals:   02/24/19 0911  BP: 128/72  Pulse: 63  Resp: 20  Temp: 97.6 F (36.4 C)  TempSrc: Temporal  SpO2: 99%  Weight: (!) 393 lb (178.3 kg)  Height: 5\' 8"  (1.727 m)    Left upper extremity: Absent left radial pulse well-healed carpal tunnel incision palpable thrill left upper arm AV fistula obese upper arm  Assessment: Patient still has mild steal symptoms in her left hand.  She is thinks that these are currently tolerable.  She is currently dialyzing via right sided catheter and wishes to get this out as soon as possible.  I discussed with her today second stage basilic vein fistula.  Risk benefits possible complications were discussed with patient today as well as operative details.  She wishes to proceed.  Plan: Second stage basilic vein transposition with general anesthesia scheduled for next Tuesday, August 4  Ruta Hinds, MD [facility] [place] Office: [phone] Pager: [phone]

## 2019-02-25 ENCOUNTER — Other Ambulatory Visit (HOSPITAL_COMMUNITY)
Admission: RE | Admit: 2019-02-25 | Discharge: 2019-02-25 | Disposition: A | Discharge status: Home / Self Care | Payer: Medicaid Other | Source: Ambulatory Visit | Attending: Vascular Surgery | Admitting: Vascular Surgery

## 2019-02-25 DIAGNOSIS — Z20828 Contact with and (suspected) exposure to other viral communicable diseases: Secondary | ICD-10-CM | POA: Diagnosis not present

## 2019-02-25 LAB — SARS CORONAVIRUS 2 (TAT 6-24 HRS): SARS Coronavirus 2: NEGATIVE

## 2019-02-28 ENCOUNTER — Encounter (HOSPITAL_COMMUNITY): Payer: Self-pay | Admitting: *Deleted

## 2019-02-28 ENCOUNTER — Other Ambulatory Visit: Payer: Self-pay

## 2019-02-28 MED ORDER — DEXTROSE 5 % IV SOLN
3.0000 g | INTRAVENOUS | Status: AC
  Filled 2019-02-28: qty 3000

## 2019-02-28 NOTE — Progress Notes (Signed)
Spoke with pt for pre-op call. Pt has hx of CHF, denies any other heart problems. Pt's PCP is Dr. Sinda Du. Pt is a type 2 diabetic. No recent A1C found and pt doesn't know what it was. Pt states her fasting blood sugar are usually 200 or greater. Pt has vision problems, her medications come in bubble packs. I instructed her not to take any of her pills in the AM. Pt is able to see her Lantus insulin well enough to be able to take 1/2 of her regular dose this evening, she will take 15 units. Instructed pt to check her blood sugar when she gets up in the AM. If blood sugar is >220 take 1/2 of usual correction dose of Novolog insulin. If blood sugar is 70 or below, treat with 1/2 cup of clear juice (apple or cranberry) and recheck blood sugar 15 minutes after drinking juice.   Pt had her Covid 19 test done on 02/25/19 and it is negative. She states she has been in quarantine since except for going to dialysis.   Coronavirus Screening  Have you experienced the following symptoms:  Cough NO Fever (>100.39F)  NO Runny nose NO Sore throat NO Difficulty breathing/shortness of breath  NO  Have you or a family member traveled in the last 14 days and where? NO   Patient reminded that hospital visitation restrictions are in effect and the importance of the restrictions. Informed pt that she may have 1 visitor to wait in the waiting room while patient is in pre-op, surgery and PACU. She voiced understanding.

## 2019-03-01 ENCOUNTER — Ambulatory Visit (HOSPITAL_COMMUNITY): Admission: RE | Admit: 2019-03-01 | Payer: Medicaid Other | Source: Home / Self Care | Admitting: Vascular Surgery

## 2019-03-01 SURGERY — TRANSPOSITION, VEIN, BASILIC
Anesthesia: Choice | Laterality: Left

## 2019-03-09 ENCOUNTER — Ambulatory Visit (INDEPENDENT_AMBULATORY_CARE_PROVIDER_SITE_OTHER): Payer: Medicaid Other

## 2019-03-09 ENCOUNTER — Ambulatory Visit (INDEPENDENT_AMBULATORY_CARE_PROVIDER_SITE_OTHER): Payer: Medicaid Other | Admitting: Podiatry

## 2019-03-09 ENCOUNTER — Other Ambulatory Visit: Payer: Self-pay

## 2019-03-09 ENCOUNTER — Encounter: Payer: Self-pay | Admitting: Podiatry

## 2019-03-09 VITALS — Temp 98.3°F

## 2019-03-09 DIAGNOSIS — B351 Tinea unguium: Secondary | ICD-10-CM | POA: Diagnosis not present

## 2019-03-09 DIAGNOSIS — Z9889 Other specified postprocedural states: Secondary | ICD-10-CM

## 2019-03-09 DIAGNOSIS — M79676 Pain in unspecified toe(s): Secondary | ICD-10-CM

## 2019-03-09 DIAGNOSIS — Z8781 Personal history of (healed) traumatic fracture: Secondary | ICD-10-CM

## 2019-03-09 DIAGNOSIS — S82851D Displaced trimalleolar fracture of right lower leg, subsequent encounter for closed fracture with routine healing: Secondary | ICD-10-CM

## 2019-03-14 NOTE — Progress Notes (Signed)
   SUBJECTIVE Patient presents to office today status post ORIF right ankle. DOS: 12/17/2018. She states she is doing well. She denies any significant pain. She has been using the CAM boot which helps alleviate any symptoms she may be having. She denies any modifying factors and states she is walking without issue.  She is also complaining of elongated, thickened nails that cause pain while ambulating in shoes. She is unable to trim her own nails. Patient is here for further evaluation and treatment.   Past Medical History:  Diagnosis Date  . Acid reflux    takes Tums  . Anemia   . Arthritis   . Bipolar 1 disorder ([place])   . Blindness of left eye   . Carpal tunnel syndrome, bilateral   . Cervical radiculopathy   . CHF (congestive heart failure) ([place])   . Chronic kidney disease    Stage 5- 01/25/17  . Constipation   . COPD (chronic obstructive pulmonary disease) ([place]) 2014   bronchitis  . Degenerative disc disease, thoracic   . Depression   . Diabetes mellitus    Type II  . Diabetic retinopathy ([place])   . Dyspnea    when walking  . End stage kidney disease ([place])    M, W, F [facility] [place]  . Hypertension   . Noncompliance with medication regimen   . Noncompliance with medication regimen   . Obesity (BMI 30-39.9)   . OSA (obstructive sleep apnea)    cpap  . Panic attack   . RLS (restless legs syndrome)     OBJECTIVE General Patient is awake, alert, and oriented x 3 and in no acute distress. Derm Skin is dry and supple bilateral. Negative open lesions or macerations. Remaining integument unremarkable. Nails are tender, long, thickened and dystrophic with subungual debris, consistent with onychomycosis, 1-5 bilateral. No signs of infection noted. Vasc  DP and PT pedal pulses palpable bilaterally. Temperature gradient within normal limits.  Neuro Epicritic and protective threshold sensation grossly intact bilaterally.  Musculoskeletal Exam No symptomatic pedal deformities noted  bilateral. Muscular strength within normal limits.  ASSESSMENT 1. Onychodystrophic nails 1-5 bilateral with hyperkeratosis of nails.  2. Onychomycosis of nail due to dermatophyte bilateral 3. S/p ORIF right ankle fracture DOS: 12/17/2018  PLAN OF CARE 1. Patient evaluated today.  2. Instructed to maintain good pedal hygiene and foot care.  3. Mechanical debridement of nails 1-5 bilaterally performed using a nail nipper. Filed with dremel without incident.  4. Transition from CAM boot into ankle brace. Ankle brace dispensed.  5. Return to clinic in 3 mos.    Edrick Kins, DPM [facility]  Dr. Edrick Kins, [address]                                        [place], [place] [address]                Office [phone]  Fax [phone]

## 2019-04-18 ENCOUNTER — Encounter: Payer: Self-pay | Admitting: *Deleted

## 2019-04-18 ENCOUNTER — Other Ambulatory Visit: Payer: Self-pay | Admitting: *Deleted

## 2019-04-18 ENCOUNTER — Telehealth: Payer: Self-pay | Admitting: *Deleted

## 2019-04-18 NOTE — Progress Notes (Signed)
Pre-op instruction letter faxed to [facility]. All instructions reviewed over the phone with HEATHER at [place].

## 2019-04-18 NOTE — Telephone Encounter (Signed)
Faxed received by Nira Conn at [facility]. She will review all instructions with patient.

## 2019-04-22 ENCOUNTER — Other Ambulatory Visit (HOSPITAL_COMMUNITY)
Admission: RE | Admit: 2019-04-22 | Discharge: 2019-04-22 | Disposition: A | Discharge status: Home / Self Care | Payer: Medicaid Other | Source: Ambulatory Visit | Attending: Vascular Surgery | Admitting: Vascular Surgery

## 2019-04-23 ENCOUNTER — Other Ambulatory Visit: Payer: Self-pay

## 2019-04-23 ENCOUNTER — Encounter (HOSPITAL_COMMUNITY): Payer: Self-pay | Admitting: *Deleted

## 2019-04-23 NOTE — Progress Notes (Signed)
Denies chest pain, shob, or cardiology visit. Below instructions given for DM management.      o Check your blood sugar the morning of your surgery when you wake up and every 2 hours until you get to the Short Stay unit. . If your blood sugar is less than 70 mg/dL, you will need to treat for low blood sugar: o Do not take insulin. o Treat a low blood sugar (less than 70 mg/dL) with  cup of clear juice (cranberry or apple), 4 glucose tablets, OR glucose gel. Recheck blood sugar in 15 minutes after treatment (to make sure it is greater than 70 mg/dL). If your blood sugar is not greater than 70 mg/dL on recheck, call [phone] o  for further instructions. . Report your blood sugar to the short stay nurse when you get to Short Stay.  . If you are admitted to the hospital after surgery: o Your blood sugar will be checked by the staff and you will probably be given insulin after surgery (instead of oral diabetes medicines) to make sure you have good blood sugar levels. o The goal for blood sugar control after surgery is 80-180 mg/dL.   Do not take oral diabetes medicines (pills) the morning of surgery.  . THE NIGHT BEFORE SURGERY, take __15_________ units of __glargine_________insulin.      . If your CBG is greater than 220 mg/dL, you may take  of your sliding scale (correction) dose of insulin.

## 2019-04-25 MED ORDER — DEXTROSE 5 % IV SOLN
3.0000 g | INTRAVENOUS | Status: AC
  Administered 2019-04-26: 3 g via INTRAVENOUS
  Filled 2019-04-25: qty 3

## 2019-04-25 NOTE — Anesthesia Preprocedure Evaluation (Addendum)
Anesthesia Evaluation  Patient identified by MRN, date of birth, ID band Patient awake    Reviewed: Allergy & Precautions, NPO status , Patient's Chart, lab work & pertinent test results  Airway Mallampati: I  TM Distance: >3 FB Neck ROM: Full    Dental no notable dental hx. (+)    Pulmonary sleep apnea , COPD,  COPD inhaler, former smoker,    Pulmonary exam normal breath sounds clear to auscultation       Cardiovascular hypertension, +CHF  Normal cardiovascular exam Rhythm:Regular Rate:Normal  HLD  TTE 2018 Normal LVEF, normal valves   Neuro/Psych PSYCHIATRIC DISORDERS Anxiety Depression Bipolar Disorder negative neurological ROS     GI/Hepatic Neg liver ROS, GERD  Medicated and Controlled,  Endo/Other  negative endocrine ROSdiabetes, Insulin Dependent  Renal/GU ESRF and DialysisRenal disease (HD MWF, K 4.8)  negative genitourinary   Musculoskeletal  (+) Arthritis ,   Abdominal   Peds  Hematology  (+) Blood dyscrasia, anemia ,   Anesthesia Other Findings   Reproductive/Obstetrics                            Anesthesia Physical Anesthesia Plan  ASA: III  Anesthesia Plan: General   Post-op Pain Management:    Induction: Intravenous  PONV Risk Score and Plan: 3 and Midazolam, Dexamethasone and Ondansetron  Airway Management Planned: LMA  Additional Equipment:   Intra-op Plan:   Post-operative Plan: Extubation in OR  Informed Consent: I have reviewed the patients History and Physical, chart, labs and discussed the procedure including the risks, benefits and alternatives for the proposed anesthesia with the patient or authorized representative who has indicated his/her understanding and acceptance.     Dental advisory given  Plan Discussed with: CRNA  Anesthesia Plan Comments:         Anesthesia Quick Evaluation

## 2019-04-26 ENCOUNTER — Ambulatory Visit (HOSPITAL_COMMUNITY): Payer: Medicaid Other | Admitting: Anesthesiology

## 2019-04-26 ENCOUNTER — Other Ambulatory Visit: Payer: Self-pay

## 2019-04-26 ENCOUNTER — Encounter (HOSPITAL_COMMUNITY)
Admission: RE | Disposition: A | Discharge status: Home / Self Care | Payer: Self-pay | Source: Ambulatory Visit | Attending: Vascular Surgery

## 2019-04-26 ENCOUNTER — Encounter (HOSPITAL_COMMUNITY): Payer: Self-pay | Admitting: *Deleted

## 2019-04-26 ENCOUNTER — Ambulatory Visit (HOSPITAL_COMMUNITY)
Admission: RE | Admit: 2019-04-26 | Discharge: 2019-04-26 | Disposition: A | Discharge status: Home / Self Care | Payer: Medicaid Other | Source: Ambulatory Visit | Attending: Vascular Surgery | Admitting: Vascular Surgery

## 2019-04-26 DIAGNOSIS — I509 Heart failure, unspecified: Secondary | ICD-10-CM | POA: Insufficient documentation

## 2019-04-26 DIAGNOSIS — N185 Chronic kidney disease, stage 5: Secondary | ICD-10-CM | POA: Diagnosis not present

## 2019-04-26 DIAGNOSIS — N186 End stage renal disease: Secondary | ICD-10-CM

## 2019-04-26 DIAGNOSIS — Z20828 Contact with and (suspected) exposure to other viral communicable diseases: Secondary | ICD-10-CM | POA: Diagnosis not present

## 2019-04-26 DIAGNOSIS — K219 Gastro-esophageal reflux disease without esophagitis: Secondary | ICD-10-CM | POA: Insufficient documentation

## 2019-04-26 DIAGNOSIS — H5462 Unqualified visual loss, left eye, normal vision right eye: Secondary | ICD-10-CM | POA: Insufficient documentation

## 2019-04-26 DIAGNOSIS — Z79899 Other long term (current) drug therapy: Secondary | ICD-10-CM | POA: Diagnosis not present

## 2019-04-26 DIAGNOSIS — Z87891 Personal history of nicotine dependence: Secondary | ICD-10-CM | POA: Insufficient documentation

## 2019-04-26 DIAGNOSIS — Z794 Long term (current) use of insulin: Secondary | ICD-10-CM | POA: Insufficient documentation

## 2019-04-26 DIAGNOSIS — Z992 Dependence on renal dialysis: Secondary | ICD-10-CM | POA: Diagnosis not present

## 2019-04-26 DIAGNOSIS — M1990 Unspecified osteoarthritis, unspecified site: Secondary | ICD-10-CM | POA: Insufficient documentation

## 2019-04-26 DIAGNOSIS — E669 Obesity, unspecified: Secondary | ICD-10-CM | POA: Diagnosis not present

## 2019-04-26 DIAGNOSIS — F319 Bipolar disorder, unspecified: Secondary | ICD-10-CM | POA: Insufficient documentation

## 2019-04-26 DIAGNOSIS — G4733 Obstructive sleep apnea (adult) (pediatric): Secondary | ICD-10-CM | POA: Diagnosis not present

## 2019-04-26 DIAGNOSIS — J449 Chronic obstructive pulmonary disease, unspecified: Secondary | ICD-10-CM | POA: Insufficient documentation

## 2019-04-26 DIAGNOSIS — E1122 Type 2 diabetes mellitus with diabetic chronic kidney disease: Secondary | ICD-10-CM | POA: Insufficient documentation

## 2019-04-26 DIAGNOSIS — I132 Hypertensive heart and chronic kidney disease with heart failure and with stage 5 chronic kidney disease, or end stage renal disease: Secondary | ICD-10-CM | POA: Insufficient documentation

## 2019-04-26 DIAGNOSIS — Z6841 Body Mass Index (BMI) 40.0 and over, adult: Secondary | ICD-10-CM | POA: Diagnosis not present

## 2019-04-26 DIAGNOSIS — E11319 Type 2 diabetes mellitus with unspecified diabetic retinopathy without macular edema: Secondary | ICD-10-CM | POA: Diagnosis not present

## 2019-04-26 HISTORY — DX: Gout, unspecified: M10.9

## 2019-04-26 HISTORY — PX: BASCILIC VEIN TRANSPOSITION: SHX5742

## 2019-04-26 LAB — POCT I-STAT, CHEM 8
BUN: 76 mg/dL — ABNORMAL HIGH (ref 6–20)
Calcium, Ion: 1.15 mmol/L (ref 1.15–1.40)
Chloride: 108 mmol/L (ref 98–111)
Creatinine, Ser: 9.8 mg/dL — ABNORMAL HIGH (ref 0.44–1.00)
Glucose, Bld: 109 mg/dL — ABNORMAL HIGH (ref 70–99)
HCT: 31 % — ABNORMAL LOW (ref 36.0–46.0)
Hemoglobin: 10.5 g/dL — ABNORMAL LOW (ref 12.0–15.0)
Potassium: 4.5 mmol/L (ref 3.5–5.1)
Sodium: 140 mmol/L (ref 135–145)
TCO2: 20 mmol/L — ABNORMAL LOW (ref 22–32)

## 2019-04-26 LAB — GLUCOSE, CAPILLARY
Glucose-Capillary: 115 mg/dL — ABNORMAL HIGH (ref 70–99)
Glucose-Capillary: 145 mg/dL — ABNORMAL HIGH (ref 70–99)

## 2019-04-26 LAB — SARS CORONAVIRUS 2 BY RT PCR (HOSPITAL ORDER, PERFORMED IN ~~LOC~~ HOSPITAL LAB): SARS Coronavirus 2: NEGATIVE

## 2019-04-26 LAB — HCG, SERUM, QUALITATIVE: Preg, Serum: POSITIVE — AB

## 2019-04-26 SURGERY — TRANSPOSITION, VEIN, BASILIC
Anesthesia: General | Laterality: Left

## 2019-04-26 MED ORDER — FENTANYL CITRATE (PF) 100 MCG/2ML IJ SOLN
25.0000 ug | INTRAMUSCULAR | Status: DC | PRN
  Administered 2019-04-26 (×3): 25 ug via INTRAVENOUS

## 2019-04-26 MED ORDER — PHENYLEPHRINE 40 MCG/ML (10ML) SYRINGE FOR IV PUSH (FOR BLOOD PRESSURE SUPPORT)
PREFILLED_SYRINGE | INTRAVENOUS | Status: DC | PRN
  Administered 2019-04-26: 80 ug via INTRAVENOUS
  Administered 2019-04-26: 120 ug via INTRAVENOUS

## 2019-04-26 MED ORDER — CHLORHEXIDINE GLUCONATE 4 % EX LIQD: 60.0000 mL | Freq: Once | CUTANEOUS | Status: DC

## 2019-04-26 MED ORDER — PROTAMINE SULFATE 10 MG/ML IV SOLN
INTRAVENOUS | Status: AC
  Filled 2019-04-26: qty 5

## 2019-04-26 MED ORDER — MIDAZOLAM HCL 2 MG/2ML IJ SOLN
INTRAMUSCULAR | Status: DC | PRN
  Administered 2019-04-26: 2 mg via INTRAVENOUS

## 2019-04-26 MED ORDER — HEPARIN SODIUM (PORCINE) 1000 UNIT/ML IJ SOLN
INTRAMUSCULAR | Status: AC
  Filled 2019-04-26: qty 1

## 2019-04-26 MED ORDER — ONDANSETRON HCL 4 MG/2ML IJ SOLN
INTRAMUSCULAR | Status: DC | PRN
  Administered 2019-04-26: 4 mg via INTRAVENOUS

## 2019-04-26 MED ORDER — 0.9 % SODIUM CHLORIDE (POUR BTL) OPTIME
TOPICAL | Status: DC | PRN
  Administered 2019-04-26: 09:00:00 1000 mL

## 2019-04-26 MED ORDER — HEPARIN SODIUM (PORCINE) 1000 UNIT/ML IJ SOLN
INTRAMUSCULAR | Status: DC | PRN
  Administered 2019-04-26: 7000 [IU] via INTRAVENOUS

## 2019-04-26 MED ORDER — SODIUM CHLORIDE 0.9 % IV SOLN
INTRAVENOUS | Status: AC
  Filled 2019-04-26: qty 1.2

## 2019-04-26 MED ORDER — FENTANYL CITRATE (PF) 100 MCG/2ML IJ SOLN
INTRAMUSCULAR | Status: AC
  Administered 2019-04-26: 25 ug via INTRAVENOUS
  Filled 2019-04-26: qty 2

## 2019-04-26 MED ORDER — MIDAZOLAM HCL 2 MG/2ML IJ SOLN
INTRAMUSCULAR | Status: AC
  Filled 2019-04-26: qty 2

## 2019-04-26 MED ORDER — ACETAMINOPHEN 500 MG PO TABS
1000.0000 mg | ORAL_TABLET | Freq: Once | ORAL | Status: AC
  Administered 2019-04-26: 1000 mg via ORAL
  Filled 2019-04-26: qty 2

## 2019-04-26 MED ORDER — LIDOCAINE HCL (PF) 1 % IJ SOLN
INTRAMUSCULAR | Status: AC
  Filled 2019-04-26: qty 30

## 2019-04-26 MED ORDER — DEXAMETHASONE SODIUM PHOSPHATE 10 MG/ML IJ SOLN
INTRAMUSCULAR | Status: DC | PRN
  Administered 2019-04-26: 5 mg via INTRAVENOUS

## 2019-04-26 MED ORDER — SODIUM CHLORIDE 0.9 % IV SOLN
INTRAVENOUS | Status: DC | PRN
  Administered 2019-04-26: 30 ug/min via INTRAVENOUS

## 2019-04-26 MED ORDER — FENTANYL CITRATE (PF) 250 MCG/5ML IJ SOLN
INTRAMUSCULAR | Status: AC
  Filled 2019-04-26: qty 5

## 2019-04-26 MED ORDER — PROPOFOL 10 MG/ML IV BOLUS
INTRAVENOUS | Status: AC
  Filled 2019-04-26: qty 20

## 2019-04-26 MED ORDER — PROTAMINE SULFATE 10 MG/ML IV SOLN
INTRAVENOUS | Status: DC | PRN
  Administered 2019-04-26: 10 mg via INTRAVENOUS
  Administered 2019-04-26: 60 mg via INTRAVENOUS

## 2019-04-26 MED ORDER — PROPOFOL 10 MG/ML IV BOLUS
INTRAVENOUS | Status: DC | PRN
  Administered 2019-04-26: 150 mg via INTRAVENOUS

## 2019-04-26 MED ORDER — LIDOCAINE 2% (20 MG/ML) 5 ML SYRINGE
INTRAMUSCULAR | Status: DC | PRN
  Administered 2019-04-26: 100 mg via INTRAVENOUS

## 2019-04-26 MED ORDER — SODIUM CHLORIDE 0.9 % IV SOLN
INTRAVENOUS | Status: DC | PRN
  Administered 2019-04-26: 500 mL

## 2019-04-26 MED ORDER — SODIUM CHLORIDE 0.9 % IV SOLN
INTRAVENOUS | Status: DC
  Administered 2019-04-26 (×2): via INTRAVENOUS

## 2019-04-26 MED ORDER — FENTANYL CITRATE (PF) 250 MCG/5ML IJ SOLN
INTRAMUSCULAR | Status: DC | PRN
  Administered 2019-04-26: 50 ug via INTRAVENOUS
  Administered 2019-04-26: 25 ug via INTRAVENOUS
  Administered 2019-04-26: 50 ug via INTRAVENOUS
  Administered 2019-04-26: 100 ug via INTRAVENOUS
  Administered 2019-04-26: 50 ug via INTRAVENOUS
  Administered 2019-04-26: 25 ug via INTRAVENOUS

## 2019-04-26 SURGICAL SUPPLY — 39 items
ARMBAND PINK RESTRICT EXTREMIT (MISCELLANEOUS) ×2 IMPLANT
CANISTER SUCT 3000ML PPV (MISCELLANEOUS) ×2 IMPLANT
CANNULA VESSEL 3MM 2 BLNT TIP (CANNULA) ×2 IMPLANT
CLIP VESOCCLUDE MED 24/CT (CLIP) ×2 IMPLANT
CLIP VESOCCLUDE SM WIDE 24/CT (CLIP) ×2 IMPLANT
COVER PROBE W GEL 5X96 (DRAPES) IMPLANT
COVER WAND RF STERILE (DRAPES) IMPLANT
DECANTER SPIKE VIAL GLASS SM (MISCELLANEOUS) IMPLANT
DERMABOND ADVANCED (GAUZE/BANDAGES/DRESSINGS) ×1
DERMABOND ADVANCED .7 DNX12 (GAUZE/BANDAGES/DRESSINGS) ×1 IMPLANT
DRAIN PENROSE 1/4X12 LTX STRL (WOUND CARE) ×2 IMPLANT
ELECT REM PT RETURN 9FT ADLT (ELECTROSURGICAL) ×2
ELECTRODE REM PT RTRN 9FT ADLT (ELECTROSURGICAL) ×1 IMPLANT
GLOVE BIO SURGEON STRL SZ 6.5 (GLOVE) ×4 IMPLANT
GLOVE BIO SURGEON STRL SZ7.5 (GLOVE) ×2 IMPLANT
GLOVE BIOGEL PI IND STRL 6.5 (GLOVE) ×2 IMPLANT
GLOVE BIOGEL PI INDICATOR 6.5 (GLOVE) ×2
GOWN STRL REUS W/ TWL LRG LVL3 (GOWN DISPOSABLE) ×4 IMPLANT
GOWN STRL REUS W/TWL LRG LVL3 (GOWN DISPOSABLE) ×4
HEMOSTAT SPONGE AVITENE ULTRA (HEMOSTASIS) IMPLANT
KIT BASIN OR (CUSTOM PROCEDURE TRAY) ×2 IMPLANT
KIT TURNOVER KIT B (KITS) ×2 IMPLANT
LOOP VESSEL MINI RED (MISCELLANEOUS) IMPLANT
NS IRRIG 1000ML POUR BTL (IV SOLUTION) ×2 IMPLANT
PACK CV ACCESS (CUSTOM PROCEDURE TRAY) ×2 IMPLANT
PAD ARMBOARD 7.5X6 YLW CONV (MISCELLANEOUS) ×4 IMPLANT
SUT PROLENE 6 0 CC (SUTURE) ×10 IMPLANT
SUT PROLENE 7 0 BV 1 (SUTURE) ×2 IMPLANT
SUT SILK 2 0 PERMA HAND 18 BK (SUTURE) ×2 IMPLANT
SUT SILK 2 0 SH (SUTURE) IMPLANT
SUT SILK 3 0 (SUTURE) ×2
SUT SILK 3-0 18XBRD TIE 12 (SUTURE) ×2 IMPLANT
SUT VIC AB 3-0 SH 27 (SUTURE) ×5
SUT VIC AB 3-0 SH 27X BRD (SUTURE) ×5 IMPLANT
SUT VIC AB 4-0 PS2 27 (SUTURE) ×6 IMPLANT
SUT VICRYL 4-0 PS2 18IN ABS (SUTURE) ×2 IMPLANT
TOWEL GREEN STERILE (TOWEL DISPOSABLE) ×2 IMPLANT
UNDERPAD 30X30 (UNDERPADS AND DIAPERS) ×2 IMPLANT
WATER STERILE IRR 1000ML POUR (IV SOLUTION) ×2 IMPLANT

## 2019-04-26 NOTE — Anesthesia Procedure Notes (Signed)
Procedure Name: LMA Insertion Date/Time: 04/26/2019 8:41 AM Performed by: Elayne Snare, CRNA Pre-anesthesia Checklist: Patient identified, Emergency Drugs available, Suction available and Patient being monitored Patient Re-evaluated:Patient Re-evaluated prior to induction Oxygen Delivery Method: Circle System Utilized Preoxygenation: Pre-oxygenation with 100% oxygen Induction Type: IV induction Ventilation: Mask ventilation without difficulty LMA: LMA inserted and LMA with gastric port inserted LMA Size: 4.0 Number of attempts: 1 Airway Equipment and Method: Bite block Placement Confirmation: positive ETCO2 Tube secured with: Tape Dental Injury: Teeth and Oropharynx as per pre-operative assessment

## 2019-04-26 NOTE — Discharge Instructions (Signed)
° °  [facility]  Discharge Instructions  AV Fistula or Graft Surgery for Dialysis Access  Please refer to the following instructions for your post-procedure care. Your surgeon or physician assistant will discuss any changes with you.  Activity  You may drive the day following your surgery, if you are comfortable and no longer taking prescription pain medication. Resume full activity as the soreness in your incision resolves.  Bathing/Showering  You may shower after you go home. Keep your incision dry for 48 hours. Do not soak in a bathtub, hot tub, or swim until the incision heals completely. You may not shower if you have a hemodialysis catheter.  Incision Care  Clean your incision with mild soap and water after 48 hours. Pat the area dry with a clean towel. You do not need a bandage unless otherwise instructed. Do not apply any ointments or creams to your incision. You may have skin glue on your incision. Do not peel it off. It will come off on its own in about one week. Your arm may swell a bit after surgery. To reduce swelling use pillows to elevate your arm so it is above your heart. Your doctor will tell you if you need to lightly wrap your arm with an ACE bandage.  Diet  Resume your normal diet. There are not special food restrictions following this procedure. In order to heal from your surgery, it is CRITICAL to get adequate nutrition. Your body requires vitamins, minerals, and protein. Vegetables are the best source of vitamins and minerals. Vegetables also provide the perfect balance of protein. Processed food has little nutritional value, so try to avoid this.  Medications  Resume taking all of your medications. If your incision is causing pain, you may take over-the counter pain relievers such as acetaminophen (Tylenol). If you were prescribed a stronger pain medication, please be aware these medications can cause nausea and constipation. Prevent  nausea by taking the medication with a snack or meal. Avoid constipation by drinking plenty of fluids and eating foods with high amount of fiber, such as fruits, vegetables, and grains.  Do not take Tylenol if you are taking prescription pain medications.  Follow up Your surgeon may want to see you in the office following your access surgery. If so, this will be arranged at the time of your surgery.  Please call us immediately for any of the following conditions:  Increased pain, redness, drainage (pus) from your incision site Fever of 101 degrees or higher Severe or worsening pain at your incision site Hand pain or numbness.  Reduce your risk of vascular disease:  Stop smoking. If you would like help, call [facility] at 1-800-QUIT-NOW 217-624-0135) or [place] at [place] your cholesterol Maintain a desired weight Control your diabetes Keep your blood pressure down  Dialysis  It will take several weeks to several months for your new dialysis access to be ready for use. Your surgeon will determine when it is okay to use it. Your nephrologist will continue to direct your dialysis. You can continue to use your Permcath until your new access is ready for use.   04/26/2019 Martha [name] [id number] [birth year]  Surgeon(s): Fields, Jessy Oto, MD  Procedure(s): SECOND STAGE BASILIC VEIN TRANSPOSITION LEFT ARM  x Do not stick fistula for 6 weeks    If you have any questions, please call the office at [phone].

## 2019-04-26 NOTE — Op Note (Signed)
Procedure: 2nd stage basilic vein transposition fistula Preop: ESRD Postop: ESRD Anesthesia: General Asst: Samantha Rhyne PAC Findings 6-8  Mm fistula  Operative details: After obtaining informed consent, the patient was taken to the operating room. The patient was placed in supine position on the operating room table. After administration of general anesthesia, the patient's entire left upper extremity was prepped and draped in usual sterile fashion. A longitudinal incision was made in the left antecubital area. Incision was carried down through the subcutaneous tissues down to the level of the preexisting basilic to brachial artery fistula. The vein was approximately 6-8 mm in diameter. The fistula was dissected free circumferentially and side branches ligated and divided between silk ties.  Four additional longitudinal incisions were made to dissect out the vein to the level of the axilla.  The remainder of the branches were smaller and ligated and divided between clips or ties. After the fistula was completely mobilized a subcutaneous tunnel was created connecting the antecubital incision to the axillary vein was 7 7000 is measuring 7.  It was then clamped just above the proximal anastomosis and in the axilla.  The vein was then divided and brought through the subcutaneous tunnel after marking orientation.  The vein was then reapproximated he has been using a running 6-0 Prolene suture.  Despite completion anastomosis it was for blood but anastomosis.  Clamps released there is palpable thrill in the fistula immediately.  Was given 70 mg of Lasix  Incisions were then closed in the deep layers with multiple layers with running 3-0 Vicryl suture.  The skin incisions were closed with 4-0 Vicryl subcuticular stitch. Dermabond was applied to all incisions.  The patient tolerated the procedure well and there were no complications. Instrument sponge and needle counts were correct at the end of the case. The  patient was taken to the recovery room in stable condition.   Ruta Hinds, MD

## 2019-04-26 NOTE — H&P (Signed)
Patient is a 53 year old female who returns for follow-up today.  She had a left for stage basilic vein fistula placed January 2020.  She had significant numbness and tingling in her hand probably secondary to at least mild steal.  However, she recently underwent carpal tunnel release by Dr. Burney Gauze and had some mild improvement of her symptoms.  She still has occasional pain in her left hand but wishes to proceed with second stage basilic fistula at this point.  She has previously had a steal in the right arm and required fistula ligation.  Duplex ultrasound of the fistula performed in February 2020 showed a diameter of 5 to 8 mm.  Her dialysis today is Monday Wednesday Friday.  Chronic medical problems remain bipolar disorder, congestive failure, end-stage renal disease, diabetes, diabetic retinopathy and neuropathy.  All of which are currently stable.  She recently had a trimalleolar fracture of her right leg and is recovering from this.      Past Medical History:  Diagnosis Date  . Acid reflux    takes Tums  . Anemia   . Arthritis   . Bipolar 1 disorder ([place])   . Blindness of left eye   . Carpal tunnel syndrome, bilateral   . Cervical radiculopathy   . CHF (congestive heart failure) ([place])   . Chronic kidney disease    Stage 5- 01/25/17  . Constipation   . COPD (chronic obstructive pulmonary disease) ([place]) 2014   bronchitis  . Degenerative disc disease, thoracic   . Depression   . Diabetes mellitus    Type II  . Diabetic retinopathy ([place])   . Dyspnea    when walking  . End stage kidney disease ([place])    M, W, F [facility]  . Hypertension   . Noncompliance with medication regimen   . Noncompliance with medication regimen   . Obesity (BMI 30-39.9)   . OSA (obstructive sleep apnea)    cpap  . Panic attack   . RLS (restless legs syndrome)         Past Surgical History:  Procedure Laterality Date  . AV FISTULA PLACEMENT Right 01/27/2017    Procedure: ARTERIOVENOUS (AV) FISTULA CREATION-RIGHT ARM;  Surgeon: Elam Dutch, MD;  Location: [facility] OR;  Service: Vascular;  Laterality: Right;  . AV FISTULA PLACEMENT Left 08/03/2018   Procedure: LEFT ARTERIOVENOUS (AV) FISTULA CREATION;  Surgeon: Elam Dutch, MD;  Location: [place];  Service: Vascular;  Laterality: Left;  . BIOPSY N/A 05/17/2013   Procedure: BIOPSY;  Surgeon: [name], MD;  Location: [facility];  Service: Endoscopy;  Laterality: N/A;  . CARPAL TUNNEL RELEASE Left 11/25/2018   Procedure: LEFT CARPAL TUNNEL RELEASE;  Surgeon: Charlotte Crumb, MD;  Location: [place];  Service: Orthopedics;  Laterality: Left;  . CHOLECYSTECTOMY N/A 04/27/2014   Procedure: LAPAROSCOPIC CHOLECYSTECTOMY;  Surgeon: [name], MD;  Location: [facility];  Service: General;  Laterality: N/A;  . COLONOSCOPY  06/06/2010   [id number] bleeding secondary to internal hemorrhoids but incomplete evaluation secondary to poor right colon prep/small rectal and sigmoid colon polyps (hyperplastic). PROPOFOL  . COLONOSCOPY  May 2013   Dr. Gilliam/[facility]: 5 mm a descending colon polyp, hyperplastic. Adequate bowel prep.  . ENDOMETRIAL ABLATION    . 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 ENUCLEATION Left 02/04/2018   Procedure: ENUCLEATION WITH PLACEMENT OF IMPLANT LEFT EYE;  Surgeon: Clista Bernhardt, MD;  Location: [place];  Service: Ophthalmology;  Laterality: Left;  . ESOPHAGOGASTRODUODENOSCOPY  06/06/2010   [id number] erythema and edema of body of stomach, with sessile polypoid lesions. bx benign. no h.pylori  . ESOPHAGOGASTRODUODENOSCOPY (EGD) WITH PROPOFOL N/A 05/17/2013   Dr. Oneida Alar: normal esophagus, moderate nodular gastritis, negative path, empiric Savary dilation  . EYE SURGERY  07/2017   sx for glaucoma  . FLEXIBLE SIGMOIDOSCOPY N/A 05/17/2013   Dr. Oneida Alar: moderate sized internal hemorrhoids  . HEMORRHOID BANDING N/A 05/17/2013    Procedure: HEMORRHOID BANDING;  Surgeon: [name], MD;  [facility];  Service: Endoscopy;  Laterality: N/A;  2 bands placed  . INCISION AND DRAINAGE ABSCESS Left 10/11/2013   Procedure: INCISION AND DRAINAGE AND DEBRIDEMENT LEFT BREAST  ABSCESS;  Surgeon: [name], MD;  [facility];  Service: General;  Laterality: Left;  . INSERTION OF DIALYSIS CATHETER N/A 06/08/2018   Procedure: INSERTION OF Right internal jugular TUNNELED  DIALYSIS CATHETER;  Surgeon: Angelia Mould, MD;  Location: [place];  Service: Vascular;  Laterality: N/A;  . IRRIGATION AND DEBRIDEMENT ABSCESS Right 06/01/2013   Procedure: INCISION AND DRAINAGE AND DEBRIDEMENT ABSCESS RIGHT BREAST;  Surgeon: [name], MD;  [facility];  Service: General;  Laterality: Right;  . LEFT EYE REMOVED Left 01/2018   [facility] on [address].  Marland Kitchen LIGATION OF ARTERIOVENOUS  FISTULA Right 06/08/2018   Procedure: LIGATION OF ARTERIOVENOUS  FISTULA RIGHT ARM;  Surgeon: Angelia Mould, MD;  Location: [place];  Service: Vascular;  Laterality: Right;  . ORIF ANKLE FRACTURE Right 12/17/2018   Procedure: OPEN REDUCTION INTERNAL FIXATION (ORIF) ANKLE FRACTURE;  Surgeon: Edrick Kins, DPM;  Location: [place];  Service: Podiatry;  Laterality: Right;  . SAVORY DILATION N/A 05/17/2013   Procedure: SAVORY DILATION;  Surgeon: [name], MD;  [facility];  Service: Endoscopy;  Laterality: N/A;  14/15/16  . SKIN FULL THICKNESS GRAFT Left 05/05/2018   Procedure: ABDOMINAL DERMIS FAT SKIN GRAFT FULL THICKNESS LEFT EYE;  Surgeon: Clista Bernhardt, MD;  Location: [place];  Service: Ophthalmology;  Laterality: Left;  . TUBAL LIGATION            Current Outpatient Medications on File Prior to Visit  Medication Sig Dispense Refill  . acetaminophen (TYLENOL) 325 MG tablet Take 2 tablets (650 mg total) by mouth every 6 (six) hours as needed for mild pain (or Fever >/= 101).    .  ADMELOG SOLOSTAR 100 UNIT/ML KwikPen Inject 1-8 Units into the skin at bedtime. Sliding Scale Insulin: 080-200=1 unit, 201-250=3 units, 251-300=5 units, 301-350=6 units, & 351-400=8 units, if greater than 400 notify MD    . ALPRAZolam (XANAX) 0.25 MG tablet Take 1 tablet (0.25 mg total) by mouth 3 (three) times daily as needed for anxiety. Prescription needed for SNF admission 6 tablet 0  . atorvastatin (LIPITOR) 20 MG tablet Take 20 mg by mouth daily.     . bisacodyl (DULCOLAX) 10 MG suppository Place 1 suppository (10 mg total) rectally daily as needed for moderate constipation. 12 suppository 0  . busPIRone (BUSPAR) 7.5 MG tablet Take 7.5 mg by mouth 2 (two) times daily.    . calcium carbonate (TUMS EX) 750 MG chewable tablet Chew 2 tablets by mouth See admin instructions. Take 2 tablets by mouth every 8 hours as needed for gerd & Take 2 tablets with each meal for gerd    . cinacalcet (SENSIPAR) 30 MG tablet Take 30 mg by mouth daily.    Marland Kitchen  colchicine 0.6 MG tablet Take 1 tablet (0.6 mg total) by mouth every Tuesday, Thursday, and Saturday at 6 PM. Take 1 tablet every other day (Patient taking differently: Take 0.6 mg by mouth every other day. )    . diphenhydrAMINE (BENADRYL) 25 mg capsule Take 1 capsule (25 mg total) by mouth every 6 (six) hours as needed for itching. 30 capsule 0  . doxercalciferol (HECTOROL) 4 MCG/2ML injection Inject 0.5 mLs (1 mcg total) into the vein every Monday, Wednesday, and Friday with hemodialysis. 2 mL   . epoetin alfa (EPOGEN) 10000 UNIT/ML injection Inject 1 mL (10,000 Units total) into the vein 3 (three) times a week. 1 mL   . HYDROcodone-acetaminophen (NORCO) 10-325 MG tablet Take 1 tablet by mouth every 6 (six) hours as needed (pain.). Prescription needed for SNF admission 6 tablet 0  . insulin aspart (NOVOLOG) 100 UNIT/ML injection Inject 0-9 Units into the skin 3 (three) times daily with meals. 10 mL 11  . Insulin Glargine (BASAGLAR KWIKPEN) 100  UNIT/ML SOPN Inject 30 Units into the skin daily.    . Insulin Glargine (LANTUS SOLOSTAR) 100 UNIT/ML Solostar Pen Inject 30 Units into the skin every morning. And pen needles 1/day 15 mL 11  . lidocaine-prilocaine (EMLA) cream Apply 1 application topically as needed (topical anesthesia for hemodialysis if Gebauers and Lidocaine injection are ineffective.). (Patient taking differently: Apply 1 application topically daily as needed (dialysis.). ) 30 g 0  . losartan (COZAAR) 25 MG tablet Take 25 mg by mouth daily.    . ondansetron (ZOFRAN) 4 MG tablet Take 1 tablet (4 mg total) by mouth every 6 (six) hours as needed for nausea. 20 tablet 0  . PARoxetine (PAXIL) 20 MG tablet Take 1 daily. This is to prevent panic attacks (Patient taking differently: Take 20 mg by mouth daily. This is to prevent panic attacks) 30 tablet 12  . polyethylene glycol (MIRALAX / GLYCOLAX) 17 g packet Take 17 g by mouth daily as needed for mild constipation. 14 each 0   No current facility-administered medications on file prior to visit.     Social History        Socioeconomic History  . Marital status: Significant Other    Spouse name: Not on file  . Number of children: 2  . Years of education: Not on file  . Highest education level: Not on file  Occupational History  . Occupation: Disabled  Social Needs  . Financial resource strain: Not on file  . Food insecurity    Worry: Not on file    Inability: Not on file  . Transportation needs    Medical: Not on file    Non-medical: Not on file  Tobacco Use  . Smoking status: Former Smoker    Packs/day: 1.50    Years: 25.00    Pack years: 37.50    Types: Cigarettes    Quit date: 10/23/2007    Years since quitting: 11.3  . Smokeless tobacco: Never Used  Substance and Sexual Activity  . Alcohol use: No  . Drug use: No  . Sexual activity: Yes    Birth control/protection: Surgical  Lifestyle  . Physical activity    Days per  week: Not on file    Minutes per session: Not on file  . Stress: Not on file  Relationships  . Social Herbalist on phone: Not on file    Gets together: Not on file    Attends religious service: Not on  file    Active member of club or organization: Not on file    Attends meetings of clubs or organizations: Not on file    Relationship status: Not on file  . Intimate partner violence    Fear of current or ex partner: Not on file    Emotionally abused: Not on file    Physically abused: Not on file    Forced sexual activity: Not on file  Other Topics Concern  . Not on file  Social History Narrative   Lives with fiance.       Physical exam:  Vitals:   04/26/19 0628  BP: (!) 175/83  Pulse: 69  Resp: 18  Temp: 97.7 F (36.5 C)  TempSrc: Oral  SpO2: 99%  Weight: (!) 149.7 kg  Height: 5\' 8"  (1.727 m)    Left upper extremity: Absent left radial pulse well-healed carpal tunnel incision palpable thrill left upper arm AV fistula obese upper arm  Assessment: Patient still has mild steal symptoms in her left hand.  She is thinks that these are currently tolerable.  She is currently dialyzing via right sided catheter and wishes to get this out as soon as possible.  I discussed with her today second stage basilic vein fistula.  Risk benefits possible complications were discussed with patient today as well as operative details.  She wishes to proceed.  Plan: Second stage basilic vein transposition with general anesthesia   Ruta Hinds, MD [facility] Office: [phone] Pager: [phone]

## 2019-04-26 NOTE — Transfer of Care (Signed)
Immediate Anesthesia Transfer of Care Note  Patient: Martha [name]  Procedure(s) Performed: SECOND STAGE BASILIC VEIN TRANSPOSITION LEFT ARM (Left )  Patient Location: PACU  Anesthesia Type:General  Level of Consciousness: awake, alert  and oriented  Airway & Oxygen Therapy: Patient Spontanous Breathing  Post-op Assessment: Post -op Vital signs reviewed and stable  Post vital signs: stable  Last Vitals:  Vitals Value Taken Time  BP    Temp    Pulse    Resp    SpO2      Last Pain:  Vitals:   04/26/19 0727  TempSrc:   PainSc: 7       Patients Stated Pain Goal: 3 ([id number])  Complications: No apparent anesthesia complications

## 2019-04-27 ENCOUNTER — Encounter (HOSPITAL_COMMUNITY): Payer: Self-pay | Admitting: Vascular Surgery

## 2019-04-27 NOTE — Anesthesia Postprocedure Evaluation (Signed)
Anesthesia Post Note  Patient: Selinda Michaels  Procedure(s) Performed: SECOND STAGE BASILIC VEIN TRANSPOSITION LEFT ARM (Left )     Patient location during evaluation: PACU Anesthesia Type: General Level of consciousness: awake and alert Pain management: pain level controlled Vital Signs Assessment: post-procedure vital signs reviewed and stable Respiratory status: spontaneous breathing, nonlabored ventilation, respiratory function stable and patient connected to nasal cannula oxygen Cardiovascular status: blood pressure returned to baseline and stable Postop Assessment: no apparent nausea or vomiting Anesthetic complications: no    Last Vitals:  Vitals:   04/26/19 1212 04/26/19 1215  BP: (!) 145/79   Pulse: 71   Resp: 14   Temp:  36.6 C  SpO2: 95%     Last Pain:  Vitals:   04/26/19 1224  TempSrc:   PainSc: 8                  Iniya Matzek L Kadijah Shamoon

## 2019-05-19 ENCOUNTER — Encounter: Payer: Medicaid Other | Admitting: Vascular Surgery

## 2019-05-30 ENCOUNTER — Emergency Department (HOSPITAL_COMMUNITY): Payer: Medicaid Other

## 2019-05-30 ENCOUNTER — Other Ambulatory Visit: Payer: Self-pay

## 2019-05-30 ENCOUNTER — Encounter (HOSPITAL_COMMUNITY): Payer: Self-pay

## 2019-05-30 ENCOUNTER — Emergency Department (HOSPITAL_COMMUNITY)
Admission: EM | Admit: 2019-05-30 | Discharge: 2019-05-30 | Disposition: A | Discharge status: Home / Self Care | Payer: Medicaid Other | Attending: Emergency Medicine | Admitting: Emergency Medicine

## 2019-05-30 DIAGNOSIS — Z87891 Personal history of nicotine dependence: Secondary | ICD-10-CM | POA: Insufficient documentation

## 2019-05-30 DIAGNOSIS — I132 Hypertensive heart and chronic kidney disease with heart failure and with stage 5 chronic kidney disease, or end stage renal disease: Secondary | ICD-10-CM | POA: Diagnosis not present

## 2019-05-30 DIAGNOSIS — Z992 Dependence on renal dialysis: Secondary | ICD-10-CM | POA: Diagnosis not present

## 2019-05-30 DIAGNOSIS — R0602 Shortness of breath: Secondary | ICD-10-CM | POA: Diagnosis present

## 2019-05-30 DIAGNOSIS — J449 Chronic obstructive pulmonary disease, unspecified: Secondary | ICD-10-CM | POA: Diagnosis not present

## 2019-05-30 DIAGNOSIS — E1122 Type 2 diabetes mellitus with diabetic chronic kidney disease: Secondary | ICD-10-CM | POA: Diagnosis not present

## 2019-05-30 DIAGNOSIS — E875 Hyperkalemia: Secondary | ICD-10-CM | POA: Diagnosis not present

## 2019-05-30 DIAGNOSIS — I509 Heart failure, unspecified: Secondary | ICD-10-CM | POA: Insufficient documentation

## 2019-05-30 DIAGNOSIS — N186 End stage renal disease: Secondary | ICD-10-CM | POA: Diagnosis not present

## 2019-05-30 DIAGNOSIS — Z79899 Other long term (current) drug therapy: Secondary | ICD-10-CM | POA: Insufficient documentation

## 2019-05-30 LAB — BASIC METABOLIC PANEL
Anion gap: 17 — ABNORMAL HIGH (ref 5–15)
BUN: 89 mg/dL — ABNORMAL HIGH (ref 6–20)
CO2: 18 mmol/L — ABNORMAL LOW (ref 22–32)
Calcium: 8.8 mg/dL — ABNORMAL LOW (ref 8.9–10.3)
Chloride: 109 mmol/L (ref 98–111)
Creatinine, Ser: 12.16 mg/dL — ABNORMAL HIGH (ref 0.44–1.00)
GFR calc Af Amer: 4 mL/min — ABNORMAL LOW (ref >60)
GFR calc non Af Amer: 3 mL/min — ABNORMAL LOW (ref >60)
Glucose, Bld: 132 mg/dL — ABNORMAL HIGH (ref 70–99)
Potassium: 5.7 mmol/L — ABNORMAL HIGH (ref 3.5–5.1)
Sodium: 144 mmol/L (ref 135–145)

## 2019-05-30 LAB — CBC WITH DIFFERENTIAL/PLATELET
Abs Immature Granulocytes: 0.02 10*3/uL (ref 0.00–0.07)
Basophils Absolute: 0 10*3/uL (ref 0.0–0.1)
Basophils Relative: 1 %
Eosinophils Absolute: 0.2 10*3/uL (ref 0.0–0.5)
Eosinophils Relative: 3 %
HCT: 29.1 % — ABNORMAL LOW (ref 36.0–46.0)
Hemoglobin: 8.8 g/dL — ABNORMAL LOW (ref 12.0–15.0)
Immature Granulocytes: 0 %
Lymphocytes Relative: 24 %
Lymphs Abs: 1.3 10*3/uL (ref 0.7–4.0)
MCH: 29.2 pg (ref 26.0–34.0)
MCHC: 30.2 g/dL (ref 30.0–36.0)
MCV: 96.7 fL (ref 80.0–100.0)
Monocytes Absolute: 0.3 10*3/uL (ref 0.1–1.0)
Monocytes Relative: 6 %
Neutro Abs: 3.6 10*3/uL (ref 1.7–7.7)
Neutrophils Relative %: 66 %
Platelets: 134 10*3/uL — ABNORMAL LOW (ref 150–400)
RBC: 3.01 MIL/uL — ABNORMAL LOW (ref 3.87–5.11)
RDW: 15.3 % (ref 11.5–15.5)
WBC: 5.5 10*3/uL (ref 4.0–10.5)
nRBC: 0 % (ref 0.0–0.2)

## 2019-05-30 NOTE — ED Provider Notes (Signed)
Recheck at 09 15: No obvious heart failure on chest x-ray.  Potassium slightly elevated.  No dialysis since Wednesday.  We will coordinate dialysis treatment today.   Nat Christen, MD 05/30/19 [phone]

## 2019-05-30 NOTE — ED Triage Notes (Addendum)
Pt c/o sob. Missed dialysis apt Friday. Last Wednesday. Supposed to have went today.  81% on room air with ems. Placed on 4l [place]. Currently 98% on room air .

## 2019-05-30 NOTE — ED Notes (Signed)
Armed forces operational officer for EMS transfer to [facility] on [address]

## 2019-05-30 NOTE — ED Provider Notes (Signed)
[facility] EMERGENCY DEPARTMENT Provider Note   CSN: [id number] Arrival date & time: 05/30/19  0607     History   Chief Complaint Chief Complaint  Patient presents with  . Shortness of Breath    HPI Martha [name] is a 53 y.o. female.     Patient is a 52 year old female with history of bipolar disorder, end-stage renal disease on hemodialysis, CHF, COPD, diabetes, hypertension.  She is brought by EMS for evaluation of shortness of breath.  Patient apparently skipped dialysis on Friday.  This morning she felt short of breath and called 911.  EMS personnel originally stated the patient was hypoxic with oxygen saturations in the 80s and was placed on nasal cannula.  Patient denies any chest pain, fever, productive cough.  She cannot recall who her nephrologist is, but she goes to dialysis at [facility] on [address]  The history is provided by the patient.  Shortness of Breath Severity:  Moderate Onset quality:  Sudden Duration:  3 hours Timing:  Constant Progression:  Worsening Chronicity:  New Relieved by:  Nothing Worsened by:  Nothing Ineffective treatments:  None tried   Past Medical History:  Diagnosis Date  . Acid reflux    takes Tums  . Anemia   . Arthritis   . Bipolar 1 disorder ([place])   . Blindness of left eye    left eye removed  . Carpal tunnel syndrome, bilateral   . Cervical radiculopathy   . CHF (congestive heart failure) ([place])   . Chronic kidney disease    Stage 5- 01/25/17  . Constipation   . COPD (chronic obstructive pulmonary disease) ([place]) 2014   bronchitis  . Degenerative disc disease, thoracic   . Depression   . Diabetes mellitus    Type II  . Diabetic retinopathy ([place])   . Dyspnea    when walking  . End stage kidney disease ([place])    M, W, F [facility]  . Gout   . Hypertension   . Noncompliance with medication regimen   . Noncompliance with medication regimen   . Obesity (BMI 30-39.9)   . OSA (obstructive sleep apnea)    cpap   . Panic attack   . RLS (restless legs syndrome)     Patient Active Problem List   Diagnosis Date Noted  . Trimalleolar fracture of ankle, closed, right, initial encounter 12/17/2018  . Pain and swelling of ankle, right 12/16/2018  . Metabolic encephalopathy 0000000  . SIRS (systemic inflammatory response syndrome) ([place]) 06/23/2018  . UTI (urinary tract infection) 02/26/2018  . Volume overload 02/25/2018  . 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 Dialysis patient ([place]) 12/13/2017  . ESRD (end stage renal disease) on dialysis ([place])   . Chronic diarrhea 10/08/2017  . Anemia in chronic kidney disease 05/24/2017  . Chronic obstructive pulmonary disease ([place]) 05/24/2017  . Heart failure ([place]) 05/24/2017  . 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 Renal failure (ARF), acute on chronic (HCC) 04/22/2017  . Anasarca associated with disorder of kidney 04/20/2017  . Protein-calorie malnutrition, severe 11/29/2016  . HTN (hypertension), malignant 11/20/2016  . Bipolar 1 disorder ([place]) 11/20/2016  . Noncompliance with medication regimen 11/20/2016  . Panic attack 11/20/2016  . Uncontrolled type 2 diabetes mellitus with hyperglycemia, without long-term current use of insulin ([place]) 10/27/2016  . Sensory disturbance 10/27/2016  . Left hemiparesis ([place]) 10/27/2016  . Cholelithiasis 04/26/2014  . Hyponatremia 04/26/2014  . Gallstones 04/06/2014  . Diabetic  ulcer of left great toe ([place]) 10/09/2013  . Type 2 diabetes mellitus treated with insulin ([place]) 05/30/2013  . Hyperglycemia 05/30/2013  . Internal hemorrhoids with other complication [id number]  . Umbilical hernia without mention of obstruction or gangrene 04/15/2013  . Anemia 04/15/2013  . Morbid obesity with BMI of 50.0-59.9, adult ([place]) 10/08/2012  . DM 05/13/2010  . Hyperlipidemia 05/13/2010  . Morbid obesity ([place]) 05/13/2010  . Depression with anxiety 05/13/2010  . RESTLESS LEG  SYNDROME 05/13/2010  . Essential hypertension 05/13/2010  . GERD 05/13/2010  . RECTAL BLEEDING 05/13/2010  . Sleep apnea 05/13/2010  . Dysphagia, oropharyngeal phase 05/13/2010    Past Surgical History:  Procedure Laterality Date  . AV FISTULA PLACEMENT Right 01/27/2017   Procedure: ARTERIOVENOUS (AV) FISTULA CREATION-RIGHT ARM;  Surgeon: Elam Dutch, MD;  [facility];  Service: Vascular;  Laterality: Right;  . AV FISTULA PLACEMENT Left 08/03/2018   Procedure: LEFT ARTERIOVENOUS (AV) FISTULA CREATION;  Surgeon: Elam Dutch, MD;  Location: [place];  Service: Vascular;  Laterality: Left;  . BASCILIC VEIN TRANSPOSITION Left 04/26/2019   Procedure: SECOND STAGE BASILIC VEIN TRANSPOSITION LEFT ARM;  Surgeon: Elam Dutch, MD;  Location: [place];  Service: Vascular;  Laterality: Left;  . BIOPSY N/A 05/17/2013   Procedure: BIOPSY;  Surgeon: [name], MD;  Location: [facility];  Service: Endoscopy;  Laterality: N/A;  . CARPAL TUNNEL RELEASE Left 11/25/2018   Procedure: LEFT CARPAL TUNNEL RELEASE;  Surgeon: Charlotte Crumb, MD;  Location: [place];  Service: Orthopedics;  Laterality: Left;  . CATARACT EXTRACTION Right   . CHOLECYSTECTOMY N/A 04/27/2014   Procedure: LAPAROSCOPIC CHOLECYSTECTOMY;  Surgeon: [name], MD;  Location: [facility];  Service: General;  Laterality: N/A;  . COLONOSCOPY  06/06/2010   [id number] bleeding secondary to internal hemorrhoids but incomplete evaluation secondary to poor right colon prep/small rectal and sigmoid colon polyps (hyperplastic). PROPOFOL  . COLONOSCOPY  May 2013   Dr. Gilliam/[facility]: 5 mm a descending colon polyp, hyperplastic. Adequate bowel prep.  . ENDOMETRIAL ABLATION    . 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 ENUCLEATION Left 02/04/2018   Procedure: ENUCLEATION WITH PLACEMENT OF IMPLANT LEFT EYE;  Surgeon: Clista Bernhardt, MD;  Location: [place];  Service: Ophthalmology;  Laterality: Left;   . ESOPHAGOGASTRODUODENOSCOPY  06/06/2010   [id number] erythema and edema of body of stomach, with sessile polypoid lesions. bx benign. no h.pylori  . ESOPHAGOGASTRODUODENOSCOPY (EGD) WITH PROPOFOL N/A 05/17/2013   Dr. Oneida Alar: normal esophagus, moderate nodular gastritis, negative path, empiric Savary dilation  . EYE SURGERY  07/2017   sx for glaucoma  . FLEXIBLE SIGMOIDOSCOPY N/A 05/17/2013   Dr. Oneida Alar: moderate sized internal hemorrhoids  . HEMORRHOID BANDING N/A 05/17/2013   Procedure: HEMORRHOID BANDING;  Surgeon: [name], MD;  Location: [facility];  Service: Endoscopy;  Laterality: N/A;  2 bands placed  . INCISION AND DRAINAGE ABSCESS Left 10/11/2013   Procedure: INCISION AND DRAINAGE AND DEBRIDEMENT LEFT BREAST  ABSCESS;  Surgeon: [name], MD;  Location: [facility];  Service: General;  Laterality: Left;  . INSERTION OF DIALYSIS CATHETER N/A 06/08/2018   Procedure: INSERTION OF Right internal jugular TUNNELED  DIALYSIS CATHETER;  Surgeon: Angelia Mould, MD;  Location: [place];  Service: Vascular;  Laterality: N/A;  . IRRIGATION AND DEBRIDEMENT ABSCESS Right 06/01/2013   Procedure: INCISION AND DRAINAGE AND DEBRIDEMENT ABSCESS RIGHT BREAST;  Surgeon: [name], MD;  [facility];  Service: General;  Laterality: Right;  . LEFT EYE REMOVED Left 01/2018   [facility] on [address].  Marland Kitchen LIGATION OF ARTERIOVENOUS  FISTULA Right 06/08/2018   Procedure: LIGATION OF ARTERIOVENOUS  FISTULA RIGHT ARM;  Surgeon: Angelia Mould, MD;  Location: [place];  Service: Vascular;  Laterality: Right;  . ORIF ANKLE FRACTURE Right 12/17/2018   Procedure: OPEN REDUCTION INTERNAL FIXATION (ORIF) ANKLE FRACTURE;  Surgeon: Edrick Kins, DPM;  Location: [place];  Service: Podiatry;  Laterality: Right;  . SAVORY DILATION N/A 05/17/2013   Procedure: SAVORY DILATION;  Surgeon: [name], MD;  Location: [facility];  Service: Endoscopy;  Laterality: N/A;  14/15/16  . SKIN  FULL THICKNESS GRAFT Left 05/05/2018   Procedure: ABDOMINAL DERMIS FAT SKIN GRAFT FULL THICKNESS LEFT EYE;  Surgeon: Clista Bernhardt, MD;  Location: [place];  Service: Ophthalmology;  Laterality: Left;  . TUBAL LIGATION       OB History    Gravida  2   Para  2   Term  1   Preterm  1   AB      Living  2     SAB      TAB      Ectopic      Multiple      Live Births               Home Medications    Prior to Admission medications   Medication Sig Start Date End Date Taking? Authorizing Provider  albuterol (PROVENTIL) (2.5 MG/3ML) 0.083% nebulizer solution Take 2.5 mg by nebulization every 6 (six) hours as needed for wheezing or shortness of breath.    [provider]  ALPRAZolam Duanne Moron) 1 MG tablet Take 1 mg by mouth 4 (four) times daily as needed for anxiety.    [provider]  atorvastatin (LIPITOR) 20 MG tablet Take 20 mg by mouth daily.     [provider]  baclofen (LIORESAL) 10 MG tablet Take 10 mg by mouth 2 (two) times daily.    [provider]  bisacodyl (DULCOLAX) 10 MG suppository Place 1 suppository (10 mg total) rectally daily as needed for moderate constipation. 12/03/18   Sinda Du, MD  busPIRone (BUSPAR) 7.5 MG tablet Take 7.5 mg by mouth 2 (two) times daily.    [provider]  calcium carbonate (TUMS EX) 750 MG chewable tablet Chew 2-4 tablets by mouth See admin instructions. Take 4 tablet with each meal & take 2 tablets with each snack    [provider]  cetirizine (ZYRTEC) 10 MG tablet Take 10 mg by mouth daily.    [provider]  cinacalcet (SENSIPAR) 30 MG tablet Take 30 mg by mouth daily.    [provider]  cloNIDine (CATAPRES) 0.1 MG tablet Take 0.1 mg by mouth 2 (two) times daily.    [provider]  colchicine 0.6 MG tablet Take 1 tablet (0.6 mg total) by mouth every Tuesday, Thursday, and Saturday at 6 PM. Take 1 tablet every other day Patient taking  differently: Take 0.6 mg by mouth every Tuesday, Thursday, and Saturday at 6 PM.  09/30/18   Wynetta Emery, Clanford L, MD  diphenhydrAMINE (BENADRYL) 25 mg capsule Take 1 capsule (25 mg total) by mouth every 6 (six) hours as needed for itching. 12/03/18   Sinda Du, MD  doxercalciferol (HECTOROL) 4 MCG/2ML injection Inject 0.5 mLs (1 mcg total) into the vein every Monday, Wednesday, and Friday with  hemodialysis. 12/03/18   Sinda Du, MD  epoetin alfa (EPOGEN) 10000 UNIT/ML injection Inject 1 mL (10,000 Units total) into the vein 3 (three) times a week. 12/03/18   Sinda Du, MD  gabapentin (NEURONTIN) 300 MG capsule Take 300 mg by mouth at bedtime.    [provider]  HYDROcodone-acetaminophen (NORCO) 10-325 MG tablet Take 1 tablet by mouth every 6 (six) hours as needed (pain.). Prescription needed for SNF admission Patient taking differently: Take 1 tablet by mouth 4 (four) times daily. Prescription needed for SNF admission 12/19/18   Donne Hazel, MD  insulin aspart (NOVOLOG) 100 UNIT/ML injection Inject 0-9 Units into the skin 3 (three) times daily with meals. Patient taking differently: Inject 2-15 Units into the skin 3 (three) times daily with meals. Sliding scale insulin 12/02/18   Sinda Du, MD  Insulin Glargine [facility]) 100 UNIT/ML SOPN Inject 30 Units into the skin at bedtime.     [provider]  lidocaine-prilocaine (EMLA) cream Apply 1 application topically as needed (topical anesthesia for hemodialysis if Gebauers and Lidocaine injection are ineffective.). Patient taking differently: Apply 1 application topically daily as needed (dialysis.).  04/25/17   Johnson, Clanford L, MD  losartan (COZAAR) 25 MG tablet Take 25 mg by mouth daily.    [provider]  omeprazole (PRILOSEC) 40 MG capsule Take 40 mg by mouth daily.    [provider]  ondansetron (ZOFRAN) 4 MG tablet Take 1 tablet (4 mg total) by mouth every 6 (six) hours as needed  for nausea. Patient taking differently: Take 4 mg by mouth every 4 (four) hours as needed for nausea.  12/02/18   Sinda Du, MD  PARoxetine (PAXIL) 20 MG tablet Take 1 daily. This is to prevent panic attacks Patient taking differently: Take 20 mg by mouth every evening. This is to prevent panic attacks 10/28/16   Sinda Du, MD  polyethylene glycol (MIRALAX / GLYCOLAX) 17 g packet Take 17 g by mouth daily as needed for mild constipation. 12/02/18   Sinda Du, MD  torsemide (DEMADEX) 100 MG tablet Take 100 mg by mouth 2 (two) times daily.    [provider]    Family History Family History  Adopted: Yes  Family history unknown: Yes    Social History Social History   Tobacco Use  . Smoking status: Former Smoker    Packs/day: 1.50    Years: 25.00    Pack years: 37.50    Types: Cigarettes    Quit date: 10/23/2007    Years since quitting: 11.6  . Smokeless tobacco: Never Used  Substance Use Topics  . Alcohol use: No  . Drug use: No     Allergies   Codeine and Tape   Review of Systems Review of Systems  Respiratory: Positive for shortness of breath.   All other systems reviewed and are negative.    Physical Exam Updated Vital Signs BP (!) 188/99   Pulse 80   Temp 97.8 F (36.6 C)   Resp 19   Ht 5\' 7"  (1.702 m)   Wt (!) 158.8 kg   LMP 08/03/2008 (Exact Date)   SpO2 98%   BMI 54.82 kg/m   Physical Exam Vitals signs and nursing note reviewed.  Constitutional:      General: She is not in acute distress.    Appearance: She is well-developed. She is not diaphoretic.  HENT:     Head: Normocephalic and atraumatic.  Neck:     Musculoskeletal: Normal range  of motion and neck supple.  Cardiovascular:     Rate and Rhythm: Normal rate and regular rhythm.     Heart sounds: No murmur. No friction rub. No gallop.   Pulmonary:     Effort: Pulmonary effort is normal. No respiratory distress.     Breath sounds: Normal breath sounds. No wheezing or  rales.  Abdominal:     General: Bowel sounds are normal. There is no distension.     Palpations: Abdomen is soft.     Tenderness: There is no abdominal tenderness.  Musculoskeletal: Normal range of motion.     Right lower leg: She exhibits no tenderness. No edema.     Left lower leg: She exhibits no tenderness. No edema.  Skin:    General: Skin is warm and dry.  Neurological:     Mental Status: She is alert and oriented to person, place, and time.      ED Treatments / Results  Labs (all labs ordered are listed, but only abnormal results are displayed) Labs Reviewed  BASIC METABOLIC PANEL  CBC WITH DIFFERENTIAL/PLATELET    EKG EKG Interpretation  Date/Time:  Monday May 30 2019 06:19:54 EST Ventricular Rate:  72 PR Interval:    QRS Duration: 73 QT Interval:  413 QTC Calculation: 452 R Axis:   43 Text Interpretation: Sinus rhythm Probable left atrial enlargement Low voltage, precordial leads No significant change since 11/29/2018 Confirmed by Veryl Speak [id number]) on 05/30/2019 6:23:22 AM   Radiology No results found.  Procedures Procedures (including critical care time)  Medications Ordered in ED Medications - No data to display   Initial Impression / Assessment and Plan / ED Course  I have reviewed the triage vital signs and the nursing notes.  Pertinent labs & imaging results that were available during my care of the patient were reviewed by me and considered in my medical decision making (see chart for details).  Patient with history of end-stage renal disease presenting here with shortness of breath.  She missed her dialysis session on Friday and was due to go this morning, however came here due to shortness of breath.  Patient was said to have been hypoxic with saturations in the 80s at home by EMS, however here her saturations are 99% and she is in no distress.  Chest x-ray shows cardiomegaly with mild vascular congestion.  Her lungs are essentially clear to  auscultation.  Laboratory studies are pending.  When the electrolytes return, patient will likely need arrangements for her to go to dialysis.  Care will be signed out to oncoming provider at shift change.  Final Clinical Impressions(s) / ED Diagnoses   Final diagnoses:  None    ED Discharge Orders    None       Veryl Speak, MD 05/30/19 [phone]

## 2019-05-30 NOTE — ED Notes (Signed)
PT taken by EMS to dialysis.

## 2019-05-30 NOTE — Discharge Instructions (Addendum)
Go to dialysis now for your treatment.

## 2019-05-30 NOTE — ED Notes (Signed)
Pt c/o difficulty breathing while lying in bed. Pt asked to sit on side of bed which pt reports helps her breathing. Pt's O2 sat 100% on RA. Pt states, "It's too much fluid, I know it is." Pt somewhat anxious. Pt assisted to side of bed per her request and encouraged to slow her breathing down. Lab at bedside attempting to draw blood.

## 2019-06-08 ENCOUNTER — Encounter: Payer: Medicaid Other | Admitting: Podiatry

## 2019-06-08 ENCOUNTER — Ambulatory Visit: Payer: Medicaid Other | Admitting: Podiatry

## 2019-06-27 NOTE — Progress Notes (Signed)
This encounter was created in error - please disregard.

## 2019-08-17 ENCOUNTER — Encounter (HOSPITAL_COMMUNITY): Payer: Self-pay | Admitting: *Deleted

## 2019-08-17 ENCOUNTER — Emergency Department (HOSPITAL_COMMUNITY): Payer: Medicaid Other

## 2019-08-17 ENCOUNTER — Other Ambulatory Visit: Payer: Self-pay

## 2019-08-17 ENCOUNTER — Emergency Department (HOSPITAL_COMMUNITY)
Admission: EM | Admit: 2019-08-17 | Discharge: 2019-08-17 | Disposition: A | Discharge status: Home / Self Care | Payer: Medicaid Other | Attending: Emergency Medicine | Admitting: Emergency Medicine

## 2019-08-17 DIAGNOSIS — Z20822 Contact with and (suspected) exposure to covid-19: Secondary | ICD-10-CM | POA: Insufficient documentation

## 2019-08-17 DIAGNOSIS — Z992 Dependence on renal dialysis: Secondary | ICD-10-CM | POA: Diagnosis not present

## 2019-08-17 DIAGNOSIS — N186 End stage renal disease: Secondary | ICD-10-CM | POA: Insufficient documentation

## 2019-08-17 DIAGNOSIS — R531 Weakness: Secondary | ICD-10-CM | POA: Diagnosis not present

## 2019-08-17 DIAGNOSIS — I509 Heart failure, unspecified: Secondary | ICD-10-CM | POA: Diagnosis not present

## 2019-08-17 DIAGNOSIS — Z79899 Other long term (current) drug therapy: Secondary | ICD-10-CM | POA: Insufficient documentation

## 2019-08-17 DIAGNOSIS — J449 Chronic obstructive pulmonary disease, unspecified: Secondary | ICD-10-CM | POA: Diagnosis not present

## 2019-08-17 DIAGNOSIS — E1122 Type 2 diabetes mellitus with diabetic chronic kidney disease: Secondary | ICD-10-CM | POA: Diagnosis not present

## 2019-08-17 DIAGNOSIS — Z794 Long term (current) use of insulin: Secondary | ICD-10-CM | POA: Diagnosis not present

## 2019-08-17 DIAGNOSIS — Z87891 Personal history of nicotine dependence: Secondary | ICD-10-CM | POA: Diagnosis not present

## 2019-08-17 DIAGNOSIS — M79609 Pain in unspecified limb: Secondary | ICD-10-CM | POA: Insufficient documentation

## 2019-08-17 DIAGNOSIS — I132 Hypertensive heart and chronic kidney disease with heart failure and with stage 5 chronic kidney disease, or end stage renal disease: Secondary | ICD-10-CM | POA: Diagnosis not present

## 2019-08-17 LAB — BASIC METABOLIC PANEL
Anion gap: 18 — ABNORMAL HIGH (ref 5–15)
BUN: 64 mg/dL — ABNORMAL HIGH (ref 6–20)
CO2: 20 mmol/L — ABNORMAL LOW (ref 22–32)
Calcium: 9.1 mg/dL (ref 8.9–10.3)
Chloride: 102 mmol/L (ref 98–111)
Creatinine, Ser: 11.61 mg/dL — ABNORMAL HIGH (ref 0.44–1.00)
GFR calc Af Amer: 4 mL/min — ABNORMAL LOW (ref >60)
GFR calc non Af Amer: 3 mL/min — ABNORMAL LOW (ref >60)
Glucose, Bld: 176 mg/dL — ABNORMAL HIGH (ref 70–99)
Potassium: 4.3 mmol/L (ref 3.5–5.1)
Sodium: 140 mmol/L (ref 135–145)

## 2019-08-17 LAB — CBC WITH DIFFERENTIAL/PLATELET
Abs Immature Granulocytes: 0.02 10*3/uL (ref 0.00–0.07)
Basophils Absolute: 0 10*3/uL (ref 0.0–0.1)
Basophils Relative: 0 %
Eosinophils Absolute: 0 10*3/uL (ref 0.0–0.5)
Eosinophils Relative: 0 %
HCT: 31.2 % — ABNORMAL LOW (ref 36.0–46.0)
Hemoglobin: 9.5 g/dL — ABNORMAL LOW (ref 12.0–15.0)
Immature Granulocytes: 0 %
Lymphocytes Relative: 12 %
Lymphs Abs: 0.8 10*3/uL (ref 0.7–4.0)
MCH: 28.4 pg (ref 26.0–34.0)
MCHC: 30.4 g/dL (ref 30.0–36.0)
MCV: 93.4 fL (ref 80.0–100.0)
Monocytes Absolute: 0.3 10*3/uL (ref 0.1–1.0)
Monocytes Relative: 5 %
Neutro Abs: 5.2 10*3/uL (ref 1.7–7.7)
Neutrophils Relative %: 83 %
Platelets: 134 10*3/uL — ABNORMAL LOW (ref 150–400)
RBC: 3.34 MIL/uL — ABNORMAL LOW (ref 3.87–5.11)
RDW: 16.7 % — ABNORMAL HIGH (ref 11.5–15.5)
WBC: 6.3 10*3/uL (ref 4.0–10.5)
nRBC: 0 % (ref 0.0–0.2)

## 2019-08-17 LAB — SARS CORONAVIRUS 2 (TAT 6-24 HRS): SARS Coronavirus 2: NEGATIVE

## 2019-08-17 MED ORDER — FENTANYL CITRATE (PF) 100 MCG/2ML IJ SOLN
50.0000 ug | Freq: Once | INTRAMUSCULAR | Status: AC
  Administered 2019-08-17: 50 ug via INTRAMUSCULAR

## 2019-08-17 MED ORDER — CYCLOBENZAPRINE HCL 10 MG PO TABS: 10.0000 mg | ORAL_TABLET | Freq: Two times a day (BID) | ORAL | 0 refills | Status: DC | PRN

## 2019-08-17 MED ORDER — GABAPENTIN 100 MG PO CAPS
100.0000 mg | ORAL_CAPSULE | Freq: Once | ORAL | Status: AC
  Administered 2019-08-17: 100 mg via ORAL
  Filled 2019-08-17: qty 1

## 2019-08-17 MED ORDER — BACLOFEN 10 MG PO TABS
10.0000 mg | ORAL_TABLET | Freq: Once | ORAL | Status: AC
  Administered 2019-08-17: 07:00:00 10 mg via ORAL
  Filled 2019-08-17: qty 1

## 2019-08-17 MED ORDER — FENTANYL CITRATE (PF) 100 MCG/2ML IJ SOLN
50.0000 ug | Freq: Once | INTRAMUSCULAR | Status: DC
  Filled 2019-08-17: qty 2

## 2019-08-17 NOTE — Discharge Instructions (Signed)
Go to dialysis today. Your Covid test should be back by tomorrow. Take the Flexeril as needed for shaking and follow-up with your family doctor within a week

## 2019-08-17 NOTE — ED Triage Notes (Signed)
Pt brought in by [facility] for c/o weakness and difficulty walking; pt states she did not go to dialysis on Monday because "I did not have my leg pills" and states she was hurting too bad; pt is scheduled for dialysis today

## 2019-08-17 NOTE — ED Provider Notes (Signed)
Emergency Department Provider Note   I have reviewed the triage vital signs and the nursing notes.   HISTORY  Chief Complaint Weakness   HPI Martha [name] is a 54 y.o. female who presents to the ED with multiple complaints. Her history is very inconsistent. She states she has had leg pain for many days, but today they were weak and shaky. States she can't walk because of pain (EMS noted she walked to the stretcher at the house without apparent difficulty and nursing stated she did same here). She has some sob. She has a headache. She has a cough. She has back pain. No chest pain. No rash.    No other associated or modifying symptoms.    Past Medical History:  Diagnosis Date  . Acid reflux    takes Tums  . Anemia   . Arthritis   . Bipolar 1 disorder ([place])   . Blindness of left eye    left eye removed  . Carpal tunnel syndrome, bilateral   . Cervical radiculopathy   . CHF (congestive heart failure) ([place])   . Chronic kidney disease    Stage 5- 01/25/17  . Constipation   . COPD (chronic obstructive pulmonary disease) ([name]) 2014   bronchitis  . Degenerative disc disease, thoracic   . Depression   . Diabetes mellitus    Type II  . Diabetic retinopathy ([place])   . Dyspnea    when walking  . End stage kidney disease ([place])    M, W, F [facility]  . Gout   . Hypertension   . Noncompliance with medication regimen   . Noncompliance with medication regimen   . Obesity (BMI 30-39.9)   . OSA (obstructive sleep apnea)    cpap  . Panic attack   . RLS (restless legs syndrome)     Patient Active Problem List   Diagnosis Date Noted  . Trimalleolar fracture of ankle, closed, right, initial encounter 12/17/2018  . Pain and swelling of ankle, right 12/16/2018  . Metabolic encephalopathy 0000000  . SIRS (systemic inflammatory response syndrome) ([place]) 06/23/2018  . UTI (urinary tract infection) 02/26/2018  . Volume overload 02/25/2018  . History of enucleation of left  eyeball 02/04/2018  . Dialysis patient ([place]) 12/13/2017  . ESRD (end stage renal disease) on dialysis ([place])   . Chronic diarrhea 10/08/2017  . Anemia in chronic kidney disease 05/24/2017  . Chronic obstructive pulmonary disease ([place]) 05/24/2017  . Heart failure ([place]) 05/24/2017  . 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 Renal failure (ARF), acute on chronic (HCC) 04/22/2017  . Anasarca associated with disorder of kidney 04/20/2017  . Protein-calorie malnutrition, severe 11/29/2016  . HTN (hypertension), malignant 11/20/2016  . Bipolar 1 disorder ([place]) 11/20/2016  . Noncompliance with medication regimen 11/20/2016  . Panic attack 11/20/2016  . Uncontrolled type 2 diabetes mellitus with hyperglycemia, without long-term current use of insulin ([place]) 10/27/2016  . Sensory disturbance 10/27/2016  . Left hemiparesis ([place]) 10/27/2016  . Cholelithiasis 04/26/2014  . Hyponatremia 04/26/2014  . Gallstones 04/06/2014  . Diabetic ulcer of left great toe ([place]) 10/09/2013  . Type 2 diabetes mellitus treated with insulin ([place]) 05/30/2013  . Hyperglycemia 05/30/2013  . Internal hemorrhoids with other complication [id number]  . Umbilical hernia without mention of obstruction or gangrene 04/15/2013  . Anemia 04/15/2013  . Morbid obesity with BMI of 50.0-59.9, adult ([place]) 10/08/2012  . DM 05/13/2010  . Hyperlipidemia 05/13/2010  . Morbid obesity ([place]) 05/13/2010  .  Depression with anxiety 05/13/2010  . RESTLESS LEG SYNDROME 05/13/2010  . Essential hypertension 05/13/2010  . GERD 05/13/2010  . RECTAL BLEEDING 05/13/2010  . Sleep apnea 05/13/2010  . Dysphagia, oropharyngeal phase 05/13/2010    Past Surgical History:  Procedure Laterality Date  . AV FISTULA PLACEMENT Right 01/27/2017   Procedure: ARTERIOVENOUS (AV) FISTULA CREATION-RIGHT ARM;  Surgeon: Elam Dutch, MD;  Location: [facility] OR;  Service: Vascular;  Laterality: Right;  . AV FISTULA PLACEMENT Left  08/03/2018   Procedure: LEFT ARTERIOVENOUS (AV) FISTULA CREATION;  Surgeon: Elam Dutch, MD;  Location: [place];  Service: Vascular;  Laterality: Left;  . BASCILIC VEIN TRANSPOSITION Left 04/26/2019   Procedure: SECOND STAGE BASILIC VEIN TRANSPOSITION LEFT ARM;  Surgeon: Elam Dutch, MD;  Location: [place];  Service: Vascular;  Laterality: Left;  . BIOPSY N/A 05/17/2013   Procedure: BIOPSY;  Surgeon: [name], MD;  Location: [facility];  Service: Endoscopy;  Laterality: N/A;  . CARPAL TUNNEL RELEASE Left 11/25/2018   Procedure: LEFT CARPAL TUNNEL RELEASE;  Surgeon: Charlotte Crumb, MD;  Location: [place];  Service: Orthopedics;  Laterality: Left;  . CATARACT EXTRACTION Right   . CHOLECYSTECTOMY N/A 04/27/2014   Procedure: LAPAROSCOPIC CHOLECYSTECTOMY;  Surgeon: [name], MD;  Location: [facility];  Service: General;  Laterality: N/A;  . COLONOSCOPY  06/06/2010   [id number] bleeding secondary to internal hemorrhoids but incomplete evaluation secondary to poor right colon prep/small rectal and sigmoid colon polyps (hyperplastic). PROPOFOL  . COLONOSCOPY  May 2013   Dr. Gilliam/[facility]: 5 mm a descending colon polyp, hyperplastic. Adequate bowel prep.  . ENDOMETRIAL ABLATION    . 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 ENUCLEATION Left 02/04/2018   Procedure: ENUCLEATION WITH PLACEMENT OF IMPLANT LEFT EYE;  Surgeon: Clista Bernhardt, MD;  Location: [place];  Service: Ophthalmology;  Laterality: Left;  . ESOPHAGOGASTRODUODENOSCOPY  06/06/2010   [id number] erythema and edema of body of stomach, with sessile polypoid lesions. bx benign. no h.pylori  . ESOPHAGOGASTRODUODENOSCOPY (EGD) WITH PROPOFOL N/A 05/17/2013   Dr. Oneida Alar: normal esophagus, moderate nodular gastritis, negative path, empiric Savary dilation  . EYE SURGERY  07/2017   sx for glaucoma  . FLEXIBLE SIGMOIDOSCOPY N/A 05/17/2013   Dr. Oneida Alar: moderate sized internal hemorrhoids  .  HEMORRHOID BANDING N/A 05/17/2013   Procedure: HEMORRHOID BANDING;  Surgeon: [name], MD;  [facility];  Service: Endoscopy;  Laterality: N/A;  2 bands placed  . INCISION AND DRAINAGE ABSCESS Left 10/11/2013   Procedure: INCISION AND DRAINAGE AND DEBRIDEMENT LEFT BREAST  ABSCESS;  Surgeon: [name], MD;  Location[facility];  Service: General;  Laterality: Left;  . INSERTION OF DIALYSIS CATHETER N/A 06/08/2018   Procedure: INSERTION OF Right internal jugular TUNNELED  DIALYSIS CATHETER;  Surgeon: Angelia Mould, MD;  Location: [place];  Service: Vascular;  Laterality: N/A;  . IRRIGATION AND DEBRIDEMENT ABSCESS Right 06/01/2013   Procedure: INCISION AND DRAINAGE AND DEBRIDEMENT ABSCESS RIGHT BREAST;  Surgeon: [name], MD;  [facility];  Service: General;  Laterality: Right;  . LEFT EYE REMOVED Left 01/2018   [facility] [address].  Marland Kitchen LIGATION OF ARTERIOVENOUS  FISTULA Right 06/08/2018   Procedure: LIGATION OF ARTERIOVENOUS  FISTULA RIGHT ARM;  Surgeon: Angelia Mould, MD;  Location: [place];  Service: Vascular;  Laterality: Right;  . ORIF ANKLE FRACTURE Right 12/17/2018   Procedure: OPEN REDUCTION INTERNAL FIXATION (ORIF)  ANKLE FRACTURE;  Surgeon: Edrick Kins, DPM;  Location: [place];  Service: Podiatry;  Laterality: Right;  . SAVORY DILATION N/A 05/17/2013   Procedure: SAVORY DILATION;  Surgeon: [name], MD;  [facility];  Service: Endoscopy;  Laterality: N/A;  14/15/16  . SKIN FULL THICKNESS GRAFT Left 05/05/2018   Procedure: ABDOMINAL DERMIS FAT SKIN GRAFT FULL THICKNESS LEFT EYE;  Surgeon: Clista Bernhardt, MD;  Location: [place];  Service: Ophthalmology;  Laterality: Left;  . TUBAL LIGATION      Current Outpatient Rx  . Order #: [id number] Class: Historical Med  . Order #: [id number] Class: Historical Med  . Order #: [id number] Class: Historical Med  . Order #: [id number] Class: Historical Med  . Order #: [id number] Class:  No Print  . Order #: [id number] Class: Historical Med  . Order #: [id number] Class: Historical Med  . Order #: [id number] Class: Historical Med  . Order #: [id number] Class: Historical Med  . Order #: [id number] Class: Historical Med  . Order #: [id number] Class: No Print  . Order #: [id number] Class: No Print  . Order #: [id number] Class: No Print  . Order #: [id number] Class: No Print  . Order #: [id number] Class: Historical Med  . Order #: [id number] Class: Print  . Order #: [id number] Class: No Print  . Order #: [id number] Class: Historical Med  . Order #: [id number] Class: Normal  . Order #: [id number] Class: Historical Med  . Order #: [id number] Class: Historical Med  . Order #: [id number] Class: No Print  . Order #: [id number] Class: Normal  . Order #: [id number] Class: No Print  . Order #: [id number] Class: Historical Med    Allergies Codeine and Tape  Family History  Adopted: Yes  Family history unknown: Yes    Social History Social History   Tobacco Use  . Smoking status: Former Smoker    Packs/day: 1.50    Years: 25.00    Pack years: 37.50    Types: Cigarettes    Quit date: 10/23/2007    Years since quitting: 11.8  . Smokeless tobacco: Never Used  Substance Use Topics  . Alcohol use: No  . Drug use: No    Review of Systems  All other systems negative except as documented in the HPI. All pertinent positives and negatives as reviewed in the HPI. ____________________________________________   PHYSICAL EXAM:  VITAL SIGNS: ED Triage Vitals  Enc Vitals Group     BP 08/17/19 0543 (!) 174/67     Pulse Rate 08/17/19 0543 81     Resp 08/17/19 0543 18     Temp 08/17/19 0543 100.1 F (37.8 C)     Temp Source 08/17/19 0543 Oral     SpO2 08/17/19 0543 97 %     Weight 08/17/19 0540 (!) 330 lb (149.7 kg)     Height 08/17/19 0540 5\' 9"  (1.753 m)    Constitutional: Alert and oriented. Well appearing and in no acute distress. Eyes: Conjunctivae are normal. Left enucleation. Head:  Atraumatic. Nose: No congestion/rhinnorhea. Mouth/Throat: Mucous membranes are moist.  Oropharynx non-erythematous. Neck: No stridor.  No meningeal signs.   Cardiovascular: Normal rate, regular rhythm. Good peripheral circulation. Grossly normal heart sounds.   Respiratory: Normal respiratory effort.  No retractions. Lungs diminished. Gastrointestinal: Soft and nontender. No distention.  Musculoskeletal: No lower extremity tenderness nor edema. No gross deformities of extremities. Neurologic:  Normal speech and language. No gross focal neurologic deficits are appreciated given chronic problems. Skin:  Skin is warm, dry and intact. No rash noted.  ____________________________________________   LABS (all labs ordered are listed, but  only abnormal results are displayed)  Labs Reviewed - No data to display ____________________________________________  EKG   EKG Interpretation  Date/Time:    Ventricular Rate:    PR Interval:    QRS Duration:   QT Interval:    QTC Calculation:   R Axis:     Text Interpretation:         ____________________________________________  RADIOLOGY  No results found.  ____________________________________________   PROCEDURES  Procedure(s) performed:   Procedures   ____________________________________________   INITIAL IMPRESSION / ASSESSMENT AND PLAN / ED COURSE  eval for emergent causes for symptoms. If workup clean, dc to dialysis.   Care transferred pending labs, reeval and final disposition. Anticipate discharge.      Pertinent labs & imaging results that were available during my care of the patient were reviewed by me and considered in my medical decision making (see chart for details).  ____________________________________________  FINAL CLINICAL IMPRESSION(S) / ED DIAGNOSES  Final diagnoses:  None     MEDICATIONS GIVEN DURING THIS VISIT:  Medications - No data to display   NEW OUTPATIENT MEDICATIONS STARTED  DURING THIS VISIT:  New Prescriptions   No medications on file    Note:  This note was prepared with assistance of Dragon voice recognition software. Occasional wrong-word or sound-a-like substitutions may have occurred due to the inherent limitations of voice recognition software.   Pattrick Bady, Corene Cornea, MD 08/20/19 [phone]

## 2019-08-17 NOTE — ED Notes (Signed)
[facility] called at [phone] called to inform pt is on the way for treatment.  Not able leave message d/t full mailbox.

## 2019-08-19 NOTE — ED Provider Notes (Signed)
Patient's labs are unremarkable.  Patient's Covid test is pending at discharge.  She was instructed to go to dialysis now   Milton Ferguson, MD 08/19/19 1102

## 2019-10-04 ENCOUNTER — Emergency Department (HOSPITAL_COMMUNITY): Payer: Medicaid Other

## 2019-10-04 ENCOUNTER — Other Ambulatory Visit: Payer: Self-pay

## 2019-10-04 ENCOUNTER — Encounter (HOSPITAL_COMMUNITY): Payer: Self-pay | Admitting: *Deleted

## 2019-10-04 ENCOUNTER — Emergency Department (HOSPITAL_COMMUNITY)
Admission: EM | Admit: 2019-10-04 | Discharge: 2019-10-05 | Disposition: A | Discharge status: Home / Self Care | Payer: Medicaid Other | Attending: Emergency Medicine | Admitting: Emergency Medicine

## 2019-10-04 DIAGNOSIS — Z794 Long term (current) use of insulin: Secondary | ICD-10-CM | POA: Diagnosis not present

## 2019-10-04 DIAGNOSIS — I509 Heart failure, unspecified: Secondary | ICD-10-CM | POA: Insufficient documentation

## 2019-10-04 DIAGNOSIS — H1031 Unspecified acute conjunctivitis, right eye: Secondary | ICD-10-CM | POA: Diagnosis not present

## 2019-10-04 DIAGNOSIS — N186 End stage renal disease: Secondary | ICD-10-CM | POA: Insufficient documentation

## 2019-10-04 DIAGNOSIS — H579 Unspecified disorder of eye and adnexa: Secondary | ICD-10-CM | POA: Diagnosis present

## 2019-10-04 DIAGNOSIS — I132 Hypertensive heart and chronic kidney disease with heart failure and with stage 5 chronic kidney disease, or end stage renal disease: Secondary | ICD-10-CM | POA: Diagnosis not present

## 2019-10-04 DIAGNOSIS — J449 Chronic obstructive pulmonary disease, unspecified: Secondary | ICD-10-CM | POA: Diagnosis not present

## 2019-10-04 DIAGNOSIS — E1122 Type 2 diabetes mellitus with diabetic chronic kidney disease: Secondary | ICD-10-CM | POA: Diagnosis not present

## 2019-10-04 DIAGNOSIS — Z87891 Personal history of nicotine dependence: Secondary | ICD-10-CM | POA: Diagnosis not present

## 2019-10-04 DIAGNOSIS — Z79899 Other long term (current) drug therapy: Secondary | ICD-10-CM | POA: Diagnosis not present

## 2019-10-04 DIAGNOSIS — Z992 Dependence on renal dialysis: Secondary | ICD-10-CM | POA: Diagnosis not present

## 2019-10-04 MED ORDER — GATIFLOXACIN 0.5 % OP SOLN
1.0000 [drp] | Freq: Once | OPHTHALMIC | Status: AC
  Administered 2019-10-05: 1 [drp] via OPHTHALMIC
  Filled 2019-10-04: qty 2.5

## 2019-10-04 MED ORDER — FLUORESCEIN SODIUM 1 MG OP STRP
1.0000 | ORAL_STRIP | Freq: Once | OPHTHALMIC | Status: AC
  Administered 2019-10-04: 1 via OPHTHALMIC
  Filled 2019-10-04: qty 1

## 2019-10-04 MED ORDER — TETRACAINE HCL 0.5 % OP SOLN
2.0000 [drp] | Freq: Once | OPHTHALMIC | Status: AC
  Administered 2019-10-04: 21:00:00 2 [drp] via OPHTHALMIC
  Filled 2019-10-04: qty 4

## 2019-10-04 MED ORDER — IOHEXOL 300 MG/ML  SOLN
75.0000 mL | Freq: Once | INTRAMUSCULAR | Status: AC | PRN
  Administered 2019-10-04: 22:00:00 75 mL via INTRAVENOUS

## 2019-10-04 NOTE — ED Provider Notes (Addendum)
[facility] EMERGENCY DEPARTMENT Provider Note   CSN: [id number] Arrival date & time: 10/04/19  1648     History Chief Complaint  Patient presents with  . Eye Problem    Martha [name] is a 54 y.o. female.  54 year old female presents with complaint of right eye redness, irritation, discharge.  Patient states symptoms started 5 days ago, states that her eye feels very irritated like it is has sand in it.  Denies any photophobia.  Patient reports not having a left eye secondary to diabetes, states that her left eye area is also irritated.  Reports purulent drainage from both eyes and states eye was matted shut this morning. Reports worsening vision out of the right eye compared to baseline.         Past Medical History:  Diagnosis Date  . Acid reflux    takes Tums  . Anemia   . Arthritis   . Bipolar 1 disorder ([place])   . Blindness of left eye    left eye removed  . Carpal tunnel syndrome, bilateral   . Cervical radiculopathy   . CHF (congestive heart failure) ([place])   . Chronic kidney disease    Stage 5- 01/25/17  . Constipation   . COPD (chronic obstructive pulmonary disease) ([place]) 2014   bronchitis  . Degenerative disc disease, thoracic   . Depression   . Diabetes mellitus    Type II  . Diabetic retinopathy ([place])   . Dyspnea    when walking  . End stage kidney disease ([place])    M, W, F [facility]  . Gout   . Hypertension   . Noncompliance with medication regimen   . Noncompliance with medication regimen   . Obesity (BMI 30-39.9)   . OSA (obstructive sleep apnea)    cpap  . Panic attack   . RLS (restless legs syndrome)     Patient Active Problem List   Diagnosis Date Noted  . Trimalleolar fracture of ankle, closed, right, initial encounter 12/17/2018  . Pain and swelling of ankle, right 12/16/2018  . Metabolic encephalopathy 96/78/9381  . SIRS (systemic inflammatory response syndrome) ([place]) 06/23/2018  . UTI (urinary tract infection) 02/26/2018  .  Volume overload 02/25/2018  . 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 Dialysis patient ([place]) 12/13/2017  . ESRD (end stage renal disease) on dialysis ([place])   . Chronic diarrhea 10/08/2017  . Anemia in chronic kidney disease 05/24/2017  . Chronic obstructive pulmonary disease ([place]) 05/24/2017  . Heart failure ([place]) 05/24/2017  . 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 Renal failure (ARF), acute on chronic (HCC) 04/22/2017  . Anasarca associated with disorder of kidney 04/20/2017  . Protein-calorie malnutrition, severe 11/29/2016  . HTN (hypertension), malignant 11/20/2016  . Bipolar 1 disorder ([place]) 11/20/2016  . Noncompliance with medication regimen 11/20/2016  . Panic attack 11/20/2016  . Uncontrolled type 2 diabetes mellitus with hyperglycemia, without long-term current use of insulin ([place]) 10/27/2016  . Sensory disturbance 10/27/2016  . Left hemiparesis ([place]) 10/27/2016  . Cholelithiasis 04/26/2014  . Hyponatremia 04/26/2014  . Gallstones 04/06/2014  . Diabetic ulcer of left great toe ([place]) 10/09/2013  . Type 2 diabetes mellitus treated with insulin ([place]) 05/30/2013  . Hyperglycemia 05/30/2013  . Internal hemorrhoids with other complication 01/75/1025  . Umbilical hernia without mention of obstruction or gangrene 04/15/2013  . Anemia 04/15/2013  . Morbid obesity with BMI of 50.0-59.9, adult ([place]) 10/08/2012  . DM 05/13/2010  .  Hyperlipidemia 05/13/2010  . Morbid obesity ([place]) 05/13/2010  . Depression with anxiety 05/13/2010  . RESTLESS LEG SYNDROME 05/13/2010  . Essential hypertension 05/13/2010  . GERD 05/13/2010  . RECTAL BLEEDING 05/13/2010  . Sleep apnea 05/13/2010  . Dysphagia, oropharyngeal phase 05/13/2010    Past Surgical History:  Procedure Laterality Date  . AV FISTULA PLACEMENT Right 01/27/2017   Procedure: ARTERIOVENOUS (AV) FISTULA CREATION-RIGHT ARM;  Surgeon: Elam Dutch, MD;  Location: [facility] OR;  Service:  Vascular;  Laterality: Right;  . AV FISTULA PLACEMENT Left 08/03/2018   Procedure: LEFT ARTERIOVENOUS (AV) FISTULA CREATION;  Surgeon: Elam Dutch, MD;  Location: [place];  Service: Vascular;  Laterality: Left;  . BASCILIC VEIN TRANSPOSITION Left 04/26/2019   Procedure: SECOND STAGE BASILIC VEIN TRANSPOSITION LEFT ARM;  Surgeon: Elam Dutch, MD;  Location: [place];  Service: Vascular;  Laterality: Left;  . BIOPSY N/A 05/17/2013   Procedure: BIOPSY;  Surgeon: [name], MD;  Location: [facility];  Service: Endoscopy;  Laterality: N/A;  . CARPAL TUNNEL RELEASE Left 11/25/2018   Procedure: LEFT CARPAL TUNNEL RELEASE;  Surgeon: Charlotte Crumb, MD;  Location: [place];  Service: Orthopedics;  Laterality: Left;  . CATARACT EXTRACTION Right   . CHOLECYSTECTOMY N/A 04/27/2014   Procedure: LAPAROSCOPIC CHOLECYSTECTOMY;  Surgeon: [name], MD;  Location: [facility];  Service: General;  Laterality: N/A;  . COLONOSCOPY  06/06/2010   QPR:FFMBWG bleeding secondary to internal hemorrhoids but incomplete evaluation secondary to poor right colon prep/small rectal and sigmoid colon polyps (hyperplastic). PROPOFOL  . COLONOSCOPY  May 2013   Dr. Gilliam/[facility]: 5 mm a descending colon polyp, hyperplastic. Adequate bowel prep.  . ENDOMETRIAL ABLATION    . 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 ENUCLEATION Left 02/04/2018   Procedure: ENUCLEATION WITH PLACEMENT OF IMPLANT LEFT EYE;  Surgeon: Clista Bernhardt, MD;  Location: [place];  Service: Ophthalmology;  Laterality: Left;  . ESOPHAGOGASTRODUODENOSCOPY  06/06/2010   YKZ:LDJTTSV erythema and edema of body of stomach, with sessile polypoid lesions. bx benign. no h.pylori  . ESOPHAGOGASTRODUODENOSCOPY (EGD) WITH PROPOFOL N/A 05/17/2013   Dr. Oneida Alar: normal esophagus, moderate nodular gastritis, negative path, empiric Savary dilation  . EYE SURGERY  07/2017   sx for glaucoma  . FLEXIBLE SIGMOIDOSCOPY N/A 05/17/2013    Dr. Oneida Alar: moderate sized internal hemorrhoids  . HEMORRHOID BANDING N/A 05/17/2013   Procedure: HEMORRHOID BANDING;  Surgeon: [name], MD;  Location: [facility];  Service: Endoscopy;  Laterality: N/A;  2 bands placed  . INCISION AND DRAINAGE ABSCESS Left 10/11/2013   Procedure: INCISION AND DRAINAGE AND DEBRIDEMENT LEFT BREAST  ABSCESS;  Surgeon: [name], MD;  Location: [facility];  Service: General;  Laterality: Left;  . INSERTION OF DIALYSIS CATHETER N/A 06/08/2018   Procedure: INSERTION OF Right internal jugular TUNNELED  DIALYSIS CATHETER;  Surgeon: Angelia Mould, MD;  Location: [place];  Service: Vascular;  Laterality: N/A;  . IRRIGATION AND DEBRIDEMENT ABSCESS Right 06/01/2013   Procedure: INCISION AND DRAINAGE AND DEBRIDEMENT ABSCESS RIGHT BREAST;  Surgeon: [name], MD;  [facility];  Service: General;  Laterality: Right;  . LEFT EYE REMOVED Left 01/2018   [facility] on [address].  Marland Kitchen LIGATION OF ARTERIOVENOUS  FISTULA Right 06/08/2018   Procedure: LIGATION OF ARTERIOVENOUS  FISTULA RIGHT ARM;  Surgeon: Angelia Mould, MD;  Location: [place];  Service: Vascular;  Laterality: Right;  . ORIF ANKLE FRACTURE  Right 12/17/2018   Procedure: OPEN REDUCTION INTERNAL FIXATION (ORIF) ANKLE FRACTURE;  Surgeon: Edrick Kins, DPM;  Location: [place];  Service: Podiatry;  Laterality: Right;  . SAVORY DILATION N/A 05/17/2013   Procedure: SAVORY DILATION;  Surgeon: [name], MD;  Location: [facility];  Service: Endoscopy;  Laterality: N/A;  14/15/16  . SKIN FULL THICKNESS GRAFT Left 05/05/2018   Procedure: ABDOMINAL DERMIS FAT SKIN GRAFT FULL THICKNESS LEFT EYE;  Surgeon: Clista Bernhardt, MD;  Location: [place];  Service: Ophthalmology;  Laterality: Left;  . TUBAL LIGATION       OB History    Gravida  2   Para  2   Term  1   Preterm  1   AB      Living  2     SAB      TAB      Ectopic      Multiple      Live Births               Family History  Adopted: Yes  Family history unknown: Yes    Social History   Tobacco Use  . Smoking status: Former Smoker    Packs/day: 1.50    Years: 25.00    Pack years: 37.50    Types: Cigarettes    Quit date: 10/23/2007    Years since quitting: 11.9  . Smokeless tobacco: Never Used  Substance Use Topics  . Alcohol use: No  . Drug use: No    Home Medications Prior to Admission medications   Medication Sig Start Date End Date Taking? Authorizing Provider  albuterol (PROVENTIL) (2.5 MG/3ML) 0.083% nebulizer solution Take 2.5 mg by nebulization every 6 (six) hours as needed for wheezing or shortness of breath.    [provider]  ALPRAZolam Duanne Moron) 1 MG tablet Take 1 mg by mouth 4 (four) times daily as needed for anxiety.    [provider]  atorvastatin (LIPITOR) 20 MG tablet Take 20 mg by mouth daily.     [provider]  baclofen (LIORESAL) 10 MG tablet Take 10 mg by mouth 2 (two) times daily.    [provider]  bisacodyl (DULCOLAX) 10 MG suppository Place 1 suppository (10 mg total) rectally daily as needed for moderate constipation. 12/03/18   Sinda Du, MD  busPIRone (BUSPAR) 7.5 MG tablet Take 7.5 mg by mouth 2 (two) times daily.    [provider]  calcium carbonate (TUMS EX) 750 MG chewable tablet Chew 2-4 tablets by mouth See admin instructions. Take 4 tablet with each meal & take 2 tablets with each snack    [provider]  cetirizine (ZYRTEC) 10 MG tablet Take 10 mg by mouth daily.    [provider]  cinacalcet (SENSIPAR) 30 MG tablet Take 30 mg by mouth daily.    [provider]  cloNIDine (CATAPRES) 0.1 MG tablet Take 0.1 mg by mouth 2 (two) times daily.    [provider]  colchicine 0.6 MG tablet Take 1 tablet (0.6 mg total) by mouth every Tuesday, Thursday, and Saturday at 6 PM. Take 1 tablet every other day Patient taking differently: Take 0.6 mg by mouth every  Tuesday, Thursday, and Saturday at 6 PM.  09/30/18   Johnson, Clanford L, MD  cyclobenzaprine (FLEXERIL) 10 MG tablet Take 1 tablet (10 mg total) by mouth 2 (two) times daily as needed for muscle spasms. 08/17/19   [place] Ferguson, MD  diphenhydrAMINE (BENADRYL) 25  mg capsule Take 1 capsule (25 mg total) by mouth every 6 (six) hours as needed for itching. 12/03/18   Sinda Du, MD  doxercalciferol (HECTOROL) 4 MCG/2ML injection Inject 0.5 mLs (1 mcg total) into the vein every Monday, Wednesday, and Friday with hemodialysis. 12/03/18   Sinda Du, MD  epoetin alfa (EPOGEN) 10000 UNIT/ML injection Inject 1 mL (10,000 Units total) into the vein 3 (three) times a week. 12/03/18   Sinda Du, MD  gabapentin (NEURONTIN) 300 MG capsule Take 300 mg by mouth at bedtime.    [provider]  HYDROcodone-acetaminophen (NORCO) 10-325 MG tablet Take 1 tablet by mouth every 6 (six) hours as needed (pain.). Prescription needed for SNF admission Patient taking differently: Take 1 tablet by mouth 4 (four) times daily. Prescription needed for SNF admission 12/19/18   Donne Hazel, MD  insulin aspart (NOVOLOG) 100 UNIT/ML injection Inject 0-9 Units into the skin 3 (three) times daily with meals. Patient taking differently: Inject 2-15 Units into the skin 3 (three) times daily with meals. Sliding scale insulin 12/02/18   Sinda Du, MD  Insulin Glargine [facility]) 100 UNIT/ML SOPN Inject 30 Units into the skin at bedtime.     [provider]  lidocaine-prilocaine (EMLA) cream Apply 1 application topically as needed (topical anesthesia for hemodialysis if Gebauers and Lidocaine injection are ineffective.). Patient taking differently: Apply 1 application topically daily as needed (dialysis.).  04/25/17   Johnson, Clanford L, MD  losartan (COZAAR) 25 MG tablet Take 25 mg by mouth daily.    [provider]  omeprazole (PRILOSEC) 40 MG capsule Take 40 mg by mouth daily.    [provider]  ondansetron (ZOFRAN) 4 MG tablet Take 1 tablet (4 mg total) by mouth every 6 (six) hours as needed for nausea. Patient taking differently: Take 4 mg by mouth every 4 (four) hours as needed for nausea.  12/02/18   Sinda Du, MD  PARoxetine (PAXIL) 20 MG tablet Take 1 daily. This is to prevent panic attacks Patient taking differently: Take 20 mg by mouth every evening. This is to prevent panic attacks 10/28/16   Sinda Du, MD  polyethylene glycol (MIRALAX / GLYCOLAX) 17 g packet Take 17 g by mouth daily as needed for mild constipation. 12/02/18   Sinda Du, MD  torsemide (DEMADEX) 100 MG tablet Take 100 mg by mouth 2 (two) times daily.    [provider]    Allergies    Codeine and Tape  Review of Systems   Review of Systems  Eyes: Positive for pain, discharge, redness and itching. Negative for photophobia and visual disturbance.  All other systems reviewed and are negative.   Physical Exam Updated Vital Signs BP (!) 159/71 (BP Location: Right Arm)   Pulse 63   Temp 98 F (36.7 C) (Oral)   Resp 16   Ht 5\' 7"  (1.702 m)   Wt (!) 149.7 kg   LMP 08/03/2008 (Exact Date)   SpO2 100%   BMI 51.69 kg/m   Physical Exam Vitals and nursing note reviewed.  Constitutional:      General: She is not in acute distress.    Appearance: She is well-developed. She is not diaphoretic.  HENT:     Head: Normocephalic and atraumatic.  Eyes:     General:        Right eye: Discharge present.        Left eye: Discharge present.    Intraocular pressure: Right eye pressure is 21  mmHg.     Extraocular Movements:     Right eye: Normal extraocular motion.     Conjunctiva/sclera:     Right eye: Right conjunctiva is injected. Chemosis present.     Comments: Yellow purulent drainage present in both eyes/orbits. No pain with EOM right eye. Right pupil reactive. Cornea clear  Pulmonary:     Effort: Pulmonary effort is normal.  Neurological:     Mental Status: She is  alert and oriented to person, place, and time.  Psychiatric:        Behavior: Behavior normal.     ED Results / Procedures / Treatments   Labs (all labs ordered are listed, but only abnormal results are displayed) Labs Reviewed - No data to display  EKG None  Radiology CT Orbits W Contrast  Result Date: 10/04/2019 CLINICAL DATA:  Initial evaluation for right ocular burning, acute scleritis. EXAM: CT ORBITS WITH CONTRAST TECHNIQUE: Multidetector CT images was performed according to the standard protocol following intravenous contrast administration. CONTRAST:  73mL OMNIPAQUE IOHEXOL 300 MG/ML  SOLN COMPARISON:  Prior head CT from 11/29/2018. FINDINGS: Orbits: Left ocular prosthesis in place. Postsurgical changes present at the right globe, stable. There is suggestion of mild thickening and irregularity with enhancement about the sclera of the right globe, suggesting acute scleritis. No significant surrounding inflammation within the adjacent orbital soft tissues. Intraconal and extraconal fat maintained. Optic nerves and extraocular muscles within normal limits. No abnormality at the orbital apex. Superior orbital vein within normal limits. Remote posttraumatic defect noted at the right lamina papyracea. Lacrimal gland within normal limits. No abnormality about the left orbit. No appreciable abnormality about the cavernous sinus or suprasellar cistern. Visualized sinuses: Mild mucosal thickening noted within the ethmoidal air cells. Paranasal sinuses are otherwise largely clear. Mastoid air cells and middle ear cavities are well pneumatized and free of fluid. Soft tissues: No significant preseptal or periorbital soft tissue swelling appreciated. Remainder the visualized soft tissues of the face within normal limits. Limited intracranial: Scattered vascular calcifications noted within the carotid siphons. Otherwise unremarkable. IMPRESSION: 1. Mild thickening and irregularity with enhancement about the  sclera of the right globe, suggesting acute scleritis. No significant surrounding inflammation within the adjacent orbital soft tissues to suggest concomitant postseptal cellulitis. 2. Stable postoperative changes at the right globe with left ocular prosthesis in place. Electronically Signed   By: Jeannine Boga M.D.   On: 10/04/2019 23:32    Procedures Procedures (including critical care time)  Medications Ordered in ED Medications  fluorescein ophthalmic strip 1 strip (1 strip Right Eye Given by Other 10/04/19 2038)  tetracaine (PONTOCAINE) 0.5 % ophthalmic solution 2 drop (2 drops Right Eye Given by Other 10/04/19 2038)  iohexol (OMNIPAQUE) 300 MG/ML solution 75 mL (75 mLs Intravenous Contrast Given 10/04/19 2153)  gatifloxacin (ZYMAXID) 0.5 % ophthalmic drops 1 drop (1 drop Both Eyes Given 10/05/19 0005)    ED Course  I have reviewed the triage vital signs and the nursing notes.  Pertinent labs & imaging results that were available during my care of the patient were reviewed by me and considered in my medical decision making (see chart for details).  Clinical Course as of Oct 05 18  Tue Oct 04, 4530  [id number] 54 year old female with complaint of right eye redness, drainage, irritation for the past 5 days, progressively worsening.  Reports similar irritation in her left eye/orbit area.  On exam has purulent drainage to both orbits with injection and chemosis of the right eye,  anterior chamber appears clear.  Extra ocular movements without pain, no exophthalmos.  IOP 21.  Vision is 20/200 in the right eye.  Patient reports history of multiple surgeries to this eye, states that she needs surgery on her retina however her eye doctor in [place] closed and she does not have one currently.  Case discussed with Dr. Laverta Baltimore, ER attending who has seen the patient, plan is for CT orbit with contrast, if unremarkable, patient will be referred to ophthalmology. CT returns with concern for acute scleritis,  phone call to Dr. Carolynn Sayers, ophthalmology who recommends antibiotic drops and can follow-up with the patient in the office tomorrow, does not recommend steroids at this time. Results and plan discussed with patient, patient states that she has no transportation to [facility], has an appointment for dialysis tomorrow morning at 930.  Referral placed to case management to see if there is any available assistance for transportation for this patient.  Patient given gatifloxacin drops in the emergency room, discharged with bottle for same and instructed to apply drops 4 times daily.   [LM]    Clinical Course User Index [LM] Roque Lias   MDM Rules/Calculators/A&P                      Final Clinical Impression(s) / ED Diagnoses Final diagnoses:  Acute bacterial conjunctivitis of right eye    Rx / DC Orders ED Discharge Orders    None       Tacy Learn, PA-C 10/05/19 0019    Tacy Learn, PA-C 10/05/19 Early Chars, MD 10/06/19 1929

## 2019-10-04 NOTE — Discharge Instructions (Addendum)
Apply eye drops 4 times daily to both eyes. Be sure to wash hands frequently.  Follow up with the eye doctor tomorrow morning at [place].

## 2019-10-04 NOTE — ED Triage Notes (Signed)
right eye burning for the pat 5 days, does not have a left eye

## 2019-10-05 NOTE — Progress Notes (Signed)
TOC CM spoke to pt and states she has been using Rx drops and they are helping. She has follow up with PCP on 10/06/2019 and will call to set up appt to see Ophthalmologist. She will have to arrange her Medicaid transportation 3 day in advance for her appts. ED provider updated. [place], [place] ED TOC CM [phone]

## 2019-10-19 MED ORDER — LATANOPROST 0.005 % OP SOLN
1.00 | OPHTHALMIC | Status: DC
Start: 2019-10-17 — End: 2019-10-19

## 2019-10-19 MED ORDER — PREDNISOLONE ACETATE 1 % OP SUSP
1.00 | OPHTHALMIC | Status: DC
Start: 2019-10-17 — End: 2019-10-19

## 2019-10-19 MED ORDER — BRIMONIDINE TARTRATE 0.2 % OP SOLN
1.00 | OPHTHALMIC | Status: DC
Start: 2019-10-17 — End: 2019-10-19

## 2019-10-19 MED ORDER — ATROPINE SULFATE 1 % OP SOLN
1.00 | OPHTHALMIC | Status: DC
Start: 2019-10-17 — End: 2019-10-19

## 2019-10-19 MED ORDER — DORZOLAMIDE HCL-TIMOLOL MAL 22.3-6.8 MG/ML OP SOLN
1.00 | OPHTHALMIC | Status: DC
Start: 2019-10-17 — End: 2019-10-19

## 2020-02-10 IMAGING — DX DG SHOULDER 2+V*R*
2 series · 2 of 2 positions shown · non-contrast
Comparison: None.

CLINICAL DATA: Fall.  Shoulder pain

EXAM:
RIGHT SHOULDER - 2+ VIEW

[shoulder grashey]
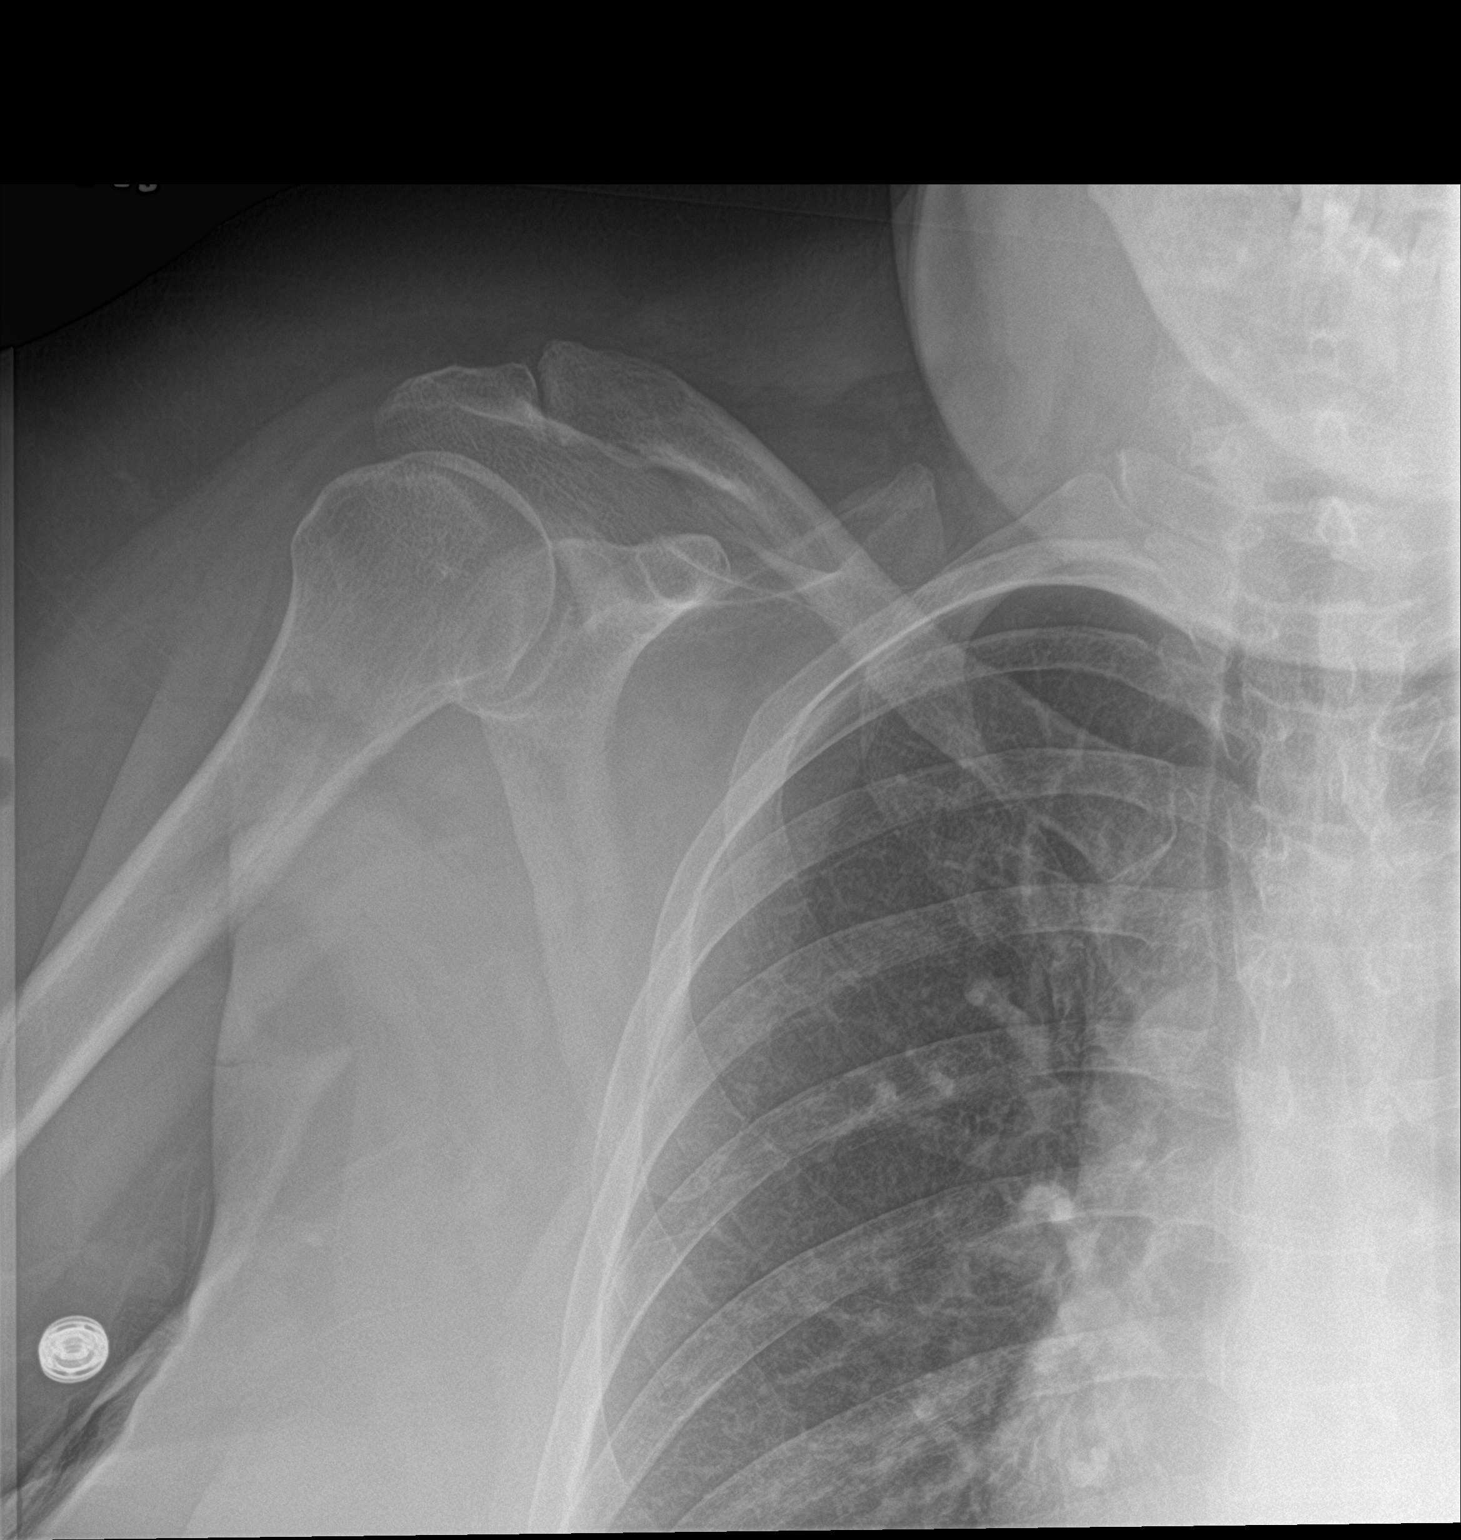

[shoulder y view]
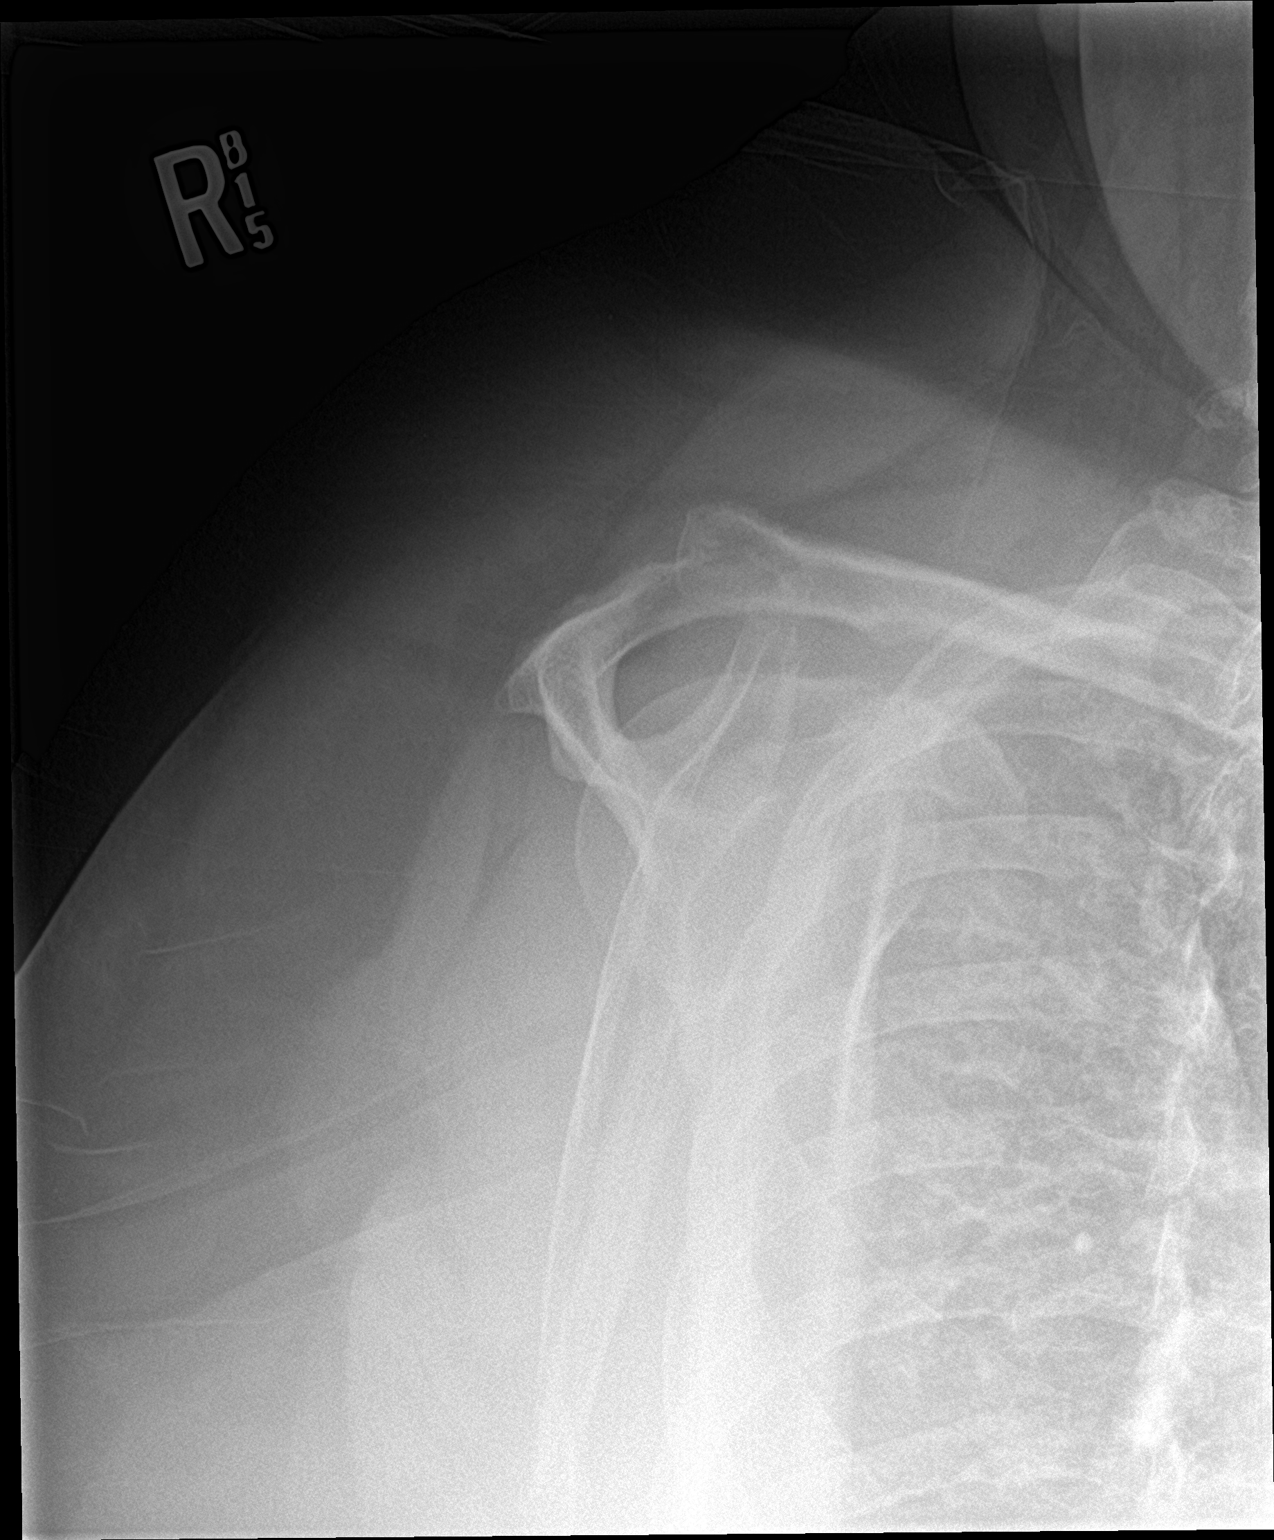

[2 of 2 positions shown; findings below may reference images not displayed]

FINDINGS: There is no evidence of fracture or dislocation. There is no
evidence of arthropathy or other focal bone abnormality. Soft
tissues are unremarkable.
IMPRESSION: Negative.

## 2020-02-28 DIAGNOSIS — E113541 Type 2 diabetes mellitus with proliferative diabetic retinopathy with combined traction retinal detachment and rhegmatogenous retinal detachment, right eye: Secondary | ICD-10-CM | POA: Insufficient documentation

## 2020-03-05 ENCOUNTER — Encounter (HOSPITAL_COMMUNITY): Payer: Self-pay | Admitting: *Deleted

## 2020-03-05 DIAGNOSIS — I509 Heart failure, unspecified: Secondary | ICD-10-CM | POA: Insufficient documentation

## 2020-03-05 DIAGNOSIS — M545 Low back pain: Secondary | ICD-10-CM | POA: Diagnosis not present

## 2020-03-05 DIAGNOSIS — N186 End stage renal disease: Secondary | ICD-10-CM | POA: Insufficient documentation

## 2020-03-05 DIAGNOSIS — R300 Dysuria: Secondary | ICD-10-CM | POA: Diagnosis not present

## 2020-03-05 DIAGNOSIS — R0602 Shortness of breath: Secondary | ICD-10-CM | POA: Insufficient documentation

## 2020-03-05 DIAGNOSIS — L03311 Cellulitis of abdominal wall: Secondary | ICD-10-CM | POA: Diagnosis not present

## 2020-03-05 DIAGNOSIS — Z79899 Other long term (current) drug therapy: Secondary | ICD-10-CM | POA: Diagnosis not present

## 2020-03-05 DIAGNOSIS — J449 Chronic obstructive pulmonary disease, unspecified: Secondary | ICD-10-CM | POA: Insufficient documentation

## 2020-03-05 DIAGNOSIS — Z794 Long term (current) use of insulin: Secondary | ICD-10-CM | POA: Insufficient documentation

## 2020-03-05 DIAGNOSIS — E1122 Type 2 diabetes mellitus with diabetic chronic kidney disease: Secondary | ICD-10-CM | POA: Insufficient documentation

## 2020-03-05 DIAGNOSIS — R109 Unspecified abdominal pain: Secondary | ICD-10-CM | POA: Diagnosis present

## 2020-03-05 DIAGNOSIS — I132 Hypertensive heart and chronic kidney disease with heart failure and with stage 5 chronic kidney disease, or end stage renal disease: Secondary | ICD-10-CM | POA: Insufficient documentation

## 2020-03-05 NOTE — ED Triage Notes (Signed)
States she is concerned she may have a UTI, states she had dialysis today and is unable to give a specimen

## 2020-03-06 ENCOUNTER — Emergency Department (HOSPITAL_COMMUNITY)
Admission: EM | Admit: 2020-03-06 | Discharge: 2020-03-06 | Disposition: A | Discharge status: Home / Self Care | Payer: Medicaid Other | Attending: Emergency Medicine | Admitting: Emergency Medicine

## 2020-03-06 ENCOUNTER — Emergency Department (HOSPITAL_COMMUNITY): Payer: Medicaid Other

## 2020-03-06 DIAGNOSIS — L03311 Cellulitis of abdominal wall: Secondary | ICD-10-CM

## 2020-03-06 DIAGNOSIS — M545 Low back pain, unspecified: Secondary | ICD-10-CM

## 2020-03-06 DIAGNOSIS — N186 End stage renal disease: Secondary | ICD-10-CM

## 2020-03-06 LAB — CBC WITH DIFFERENTIAL/PLATELET
Abs Immature Granulocytes: 0.03 10*3/uL (ref 0.00–0.07)
Basophils Absolute: 0 10*3/uL (ref 0.0–0.1)
Basophils Relative: 0 %
Eosinophils Absolute: 0.1 10*3/uL (ref 0.0–0.5)
Eosinophils Relative: 1 %
HCT: 35.6 % — ABNORMAL LOW (ref 36.0–46.0)
Hemoglobin: 10.9 g/dL — ABNORMAL LOW (ref 12.0–15.0)
Immature Granulocytes: 0 %
Lymphocytes Relative: 16 %
Lymphs Abs: 1.2 10*3/uL (ref 0.7–4.0)
MCH: 28.1 pg (ref 26.0–34.0)
MCHC: 30.6 g/dL (ref 30.0–36.0)
MCV: 91.8 fL (ref 80.0–100.0)
Monocytes Absolute: 0.4 10*3/uL (ref 0.1–1.0)
Monocytes Relative: 6 %
Neutro Abs: 6.1 10*3/uL (ref 1.7–7.7)
Neutrophils Relative %: 77 %
Platelets: 111 10*3/uL — ABNORMAL LOW (ref 150–400)
RBC: 3.88 MIL/uL (ref 3.87–5.11)
RDW: 16.6 % — ABNORMAL HIGH (ref 11.5–15.5)
WBC: 7.9 10*3/uL (ref 4.0–10.5)
nRBC: 0 % (ref 0.0–0.2)

## 2020-03-06 LAB — URINALYSIS, ROUTINE W REFLEX MICROSCOPIC
Bilirubin Urine: NEGATIVE
Glucose, UA: 150 mg/dL — AB
Ketones, ur: NEGATIVE mg/dL
Leukocytes,Ua: NEGATIVE
Nitrite: NEGATIVE
Protein, ur: >=300 mg/dL — AB
Specific Gravity, Urine: 1.019 (ref 1.005–1.030)
pH: 6 (ref 5.0–8.0)

## 2020-03-06 LAB — COMPREHENSIVE METABOLIC PANEL
ALT: 12 U/L (ref 0–44)
AST: 7 U/L — ABNORMAL LOW (ref 15–41)
Albumin: 3.7 g/dL (ref 3.5–5.0)
Alkaline Phosphatase: 159 U/L — ABNORMAL HIGH (ref 38–126)
Anion gap: 11 (ref 5–15)
BUN: 26 mg/dL — ABNORMAL HIGH (ref 6–20)
CO2: 25 mmol/L (ref 22–32)
Calcium: 8.9 mg/dL (ref 8.9–10.3)
Chloride: 101 mmol/L (ref 98–111)
Creatinine, Ser: 6.26 mg/dL — ABNORMAL HIGH (ref 0.44–1.00)
GFR calc Af Amer: 8 mL/min — ABNORMAL LOW (ref >60)
GFR calc non Af Amer: 7 mL/min — ABNORMAL LOW (ref >60)
Glucose, Bld: 147 mg/dL — ABNORMAL HIGH (ref 70–99)
Potassium: 3 mmol/L — ABNORMAL LOW (ref 3.5–5.1)
Sodium: 137 mmol/L (ref 135–145)
Total Bilirubin: 1.4 mg/dL — ABNORMAL HIGH (ref 0.3–1.2)
Total Protein: 7.4 g/dL (ref 6.5–8.1)

## 2020-03-06 LAB — LIPASE, BLOOD: Lipase: 26 U/L (ref 11–51)

## 2020-03-06 MED ORDER — ONDANSETRON 4 MG PO TBDP
4.0000 mg | ORAL_TABLET | Freq: Once | ORAL | Status: AC
  Administered 2020-03-06: 4 mg via ORAL
  Filled 2020-03-06: qty 1

## 2020-03-06 MED ORDER — FENTANYL CITRATE (PF) 100 MCG/2ML IJ SOLN
100.0000 ug | Freq: Once | INTRAMUSCULAR | Status: AC
  Administered 2020-03-06: 100 ug via INTRAMUSCULAR

## 2020-03-06 MED ORDER — FENTANYL CITRATE (PF) 100 MCG/2ML IJ SOLN
100.0000 ug | Freq: Once | INTRAMUSCULAR | Status: DC
  Filled 2020-03-06: qty 2

## 2020-03-06 MED ORDER — CLINDAMYCIN HCL 300 MG PO CAPS
300.0000 mg | ORAL_CAPSULE | Freq: Three times a day (TID) | ORAL | 0 refills | Status: DC
Start: 2020-03-06 — End: 2020-07-12

## 2020-03-06 MED ORDER — CLINDAMYCIN HCL 150 MG PO CAPS
300.0000 mg | ORAL_CAPSULE | Freq: Once | ORAL | Status: AC
  Administered 2020-03-06: 300 mg via ORAL
  Filled 2020-03-06: qty 2

## 2020-03-06 MED ORDER — HYDROCODONE-ACETAMINOPHEN 5-325 MG PO TABS
1.0000 | ORAL_TABLET | Freq: Once | ORAL | Status: AC
  Administered 2020-03-06: 1 via ORAL
  Filled 2020-03-06: qty 1

## 2020-03-06 MED ORDER — ONDANSETRON HCL 4 MG/2ML IJ SOLN
4.0000 mg | Freq: Once | INTRAMUSCULAR | Status: DC
  Filled 2020-03-06: qty 2

## 2020-03-06 NOTE — ED Provider Notes (Signed)
[name] EMERGENCY DEPARTMENT Provider Note   CSN: [id number] Arrival date & time: 03/05/20  1629     History Chief Complaint  Patient presents with  . Flank Pain    Martha [name] is a 54 y.o. female.  The history is provided by the patient.  Flank Pain This is a new problem. The current episode started yesterday. The problem occurs constantly. The problem has been gradually worsening. Associated symptoms include abdominal pain and shortness of breath. Pertinent negatives include no chest pain. Exacerbated by: Movement. Nothing relieves the symptoms.  Patient with extensive history including CHF, ESRD on dialysis, diabetes, obesity presents with left flank pain.  Patient reports for the past day she began having left flank pain and now having abdominal pain.  She reports this feels like previous urinary tract infections.  She also reports dysuria, but has very little urine output due to dialysis. She denies any fevers or vomiting.  She has shortness of breath due to the flank pain.  No new weakness or numbness in her legs     Past Medical History:  Diagnosis Date  . Acid reflux    takes Tums  . Anemia   . Arthritis   . Bipolar 1 disorder ([place])   . Blindness of left eye    left eye removed  . Carpal tunnel syndrome, bilateral   . Cervical radiculopathy   . CHF (congestive heart failure) ([place])   . Chronic kidney disease    Stage 5- 01/25/17  . Constipation   . COPD (chronic obstructive pulmonary disease) ([place]) 2014   bronchitis  . Degenerative disc disease, thoracic   . Depression   . Diabetes mellitus    Type II  . Diabetic retinopathy ([place])   . Dyspnea    when walking  . End stage kidney disease ([place])    M, W, F [facility]  . Gout   . Hypertension   . Noncompliance with medication regimen   . Noncompliance with medication regimen   . Obesity (BMI 30-39.9)   . OSA (obstructive sleep apnea)    cpap  . Panic attack   . RLS (restless legs syndrome)      Patient Active Problem List   Diagnosis Date Noted  . Trimalleolar fracture of ankle, closed, right, initial encounter 12/17/2018  . Pain and swelling of ankle, right 12/16/2018  . Metabolic encephalopathy 89/37/3428  . SIRS (systemic inflammatory response syndrome) ([place]) 06/23/2018  . UTI (urinary tract infection) 02/26/2018  . Volume overload 02/25/2018  . 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 Dialysis patient ([place]) 12/13/2017  . ESRD (end stage renal disease) on dialysis ([place])   . Chronic diarrhea 10/08/2017  . Anemia in chronic kidney disease 05/24/2017  . Chronic obstructive pulmonary disease ([place]) 05/24/2017  . Heart failure ([place]) 05/24/2017  . 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 Renal failure (ARF), acute on chronic (HCC) 04/22/2017  . Anasarca associated with disorder of kidney 04/20/2017  . Protein-calorie malnutrition, severe 11/29/2016  . HTN (hypertension), malignant 11/20/2016  . Bipolar 1 disorder ([place]) 11/20/2016  . Noncompliance with medication regimen 11/20/2016  . Panic attack 11/20/2016  . Uncontrolled type 2 diabetes mellitus with hyperglycemia, without long-term current use of insulin ([place]) 10/27/2016  . Sensory disturbance 10/27/2016  . Left hemiparesis ([place]) 10/27/2016  . Cholelithiasis 04/26/2014  . Hyponatremia 04/26/2014  . Gallstones 04/06/2014  . Diabetic ulcer of left great toe ([place]) 10/09/2013  . Type 2 diabetes  mellitus treated with insulin ([place]) 05/30/2013  . Hyperglycemia 05/30/2013  . Internal hemorrhoids with other complication 87/56/4332  . Umbilical hernia without mention of obstruction or gangrene 04/15/2013  . Anemia 04/15/2013  . Morbid obesity with BMI of 50.0-59.9, adult ([place]) 10/08/2012  . DM 05/13/2010  . Hyperlipidemia 05/13/2010  . Morbid obesity ([place]) 05/13/2010  . Depression with anxiety 05/13/2010  . RESTLESS LEG SYNDROME 05/13/2010  . Essential hypertension  05/13/2010  . GERD 05/13/2010  . RECTAL BLEEDING 05/13/2010  . Sleep apnea 05/13/2010  . Dysphagia, oropharyngeal phase 05/13/2010    Past Surgical History:  Procedure Laterality Date  . AV FISTULA PLACEMENT Right 01/27/2017   Procedure: ARTERIOVENOUS (AV) FISTULA CREATION-RIGHT ARM;  Surgeon: Elam Dutch, MD;  Location: [facility] OR;  Service: Vascular;  Laterality: Right;  . AV FISTULA PLACEMENT Left 08/03/2018   Procedure: LEFT ARTERIOVENOUS (AV) FISTULA CREATION;  Surgeon: Elam Dutch, MD;  Location: [place];  Service: Vascular;  Laterality: Left;  . BASCILIC VEIN TRANSPOSITION Left 04/26/2019   Procedure: SECOND STAGE BASILIC VEIN TRANSPOSITION LEFT ARM;  Surgeon: Elam Dutch, MD;  Location: [place];  Service: Vascular;  Laterality: Left;  . BIOPSY N/A 05/17/2013   Procedure: BIOPSY;  Surgeon: [name], MD;  Location: [facility];  Service: Endoscopy;  Laterality: N/A;  . CARPAL TUNNEL RELEASE Left 11/25/2018   Procedure: LEFT CARPAL TUNNEL RELEASE;  Surgeon: Charlotte Crumb, MD;  Location: [place];  Service: Orthopedics;  Laterality: Left;  . CATARACT EXTRACTION Right   . CHOLECYSTECTOMY N/A 04/27/2014   Procedure: LAPAROSCOPIC CHOLECYSTECTOMY;  Surgeon: [name], MD;  Location: [facility];  Service: General;  Laterality: N/A;  . COLONOSCOPY  06/06/2010   RJJ:OACZYS bleeding secondary to internal hemorrhoids but incomplete evaluation secondary to poor right colon prep/small rectal and sigmoid colon polyps (hyperplastic). PROPOFOL  . COLONOSCOPY  May 2013   Dr. Gilliam/[facility]: 5 mm a descending colon polyp, hyperplastic. Adequate bowel prep.  . ENDOMETRIAL ABLATION    . 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 ENUCLEATION Left 02/04/2018   Procedure: ENUCLEATION WITH PLACEMENT OF IMPLANT LEFT EYE;  Surgeon: Clista Bernhardt, MD;  Location: [place];  Service: Ophthalmology;  Laterality: Left;  . ESOPHAGOGASTRODUODENOSCOPY  06/06/2010    AYT:KZSWFUX erythema and edema of body of stomach, with sessile polypoid lesions. bx benign. no h.pylori  . ESOPHAGOGASTRODUODENOSCOPY (EGD) WITH PROPOFOL N/A 05/17/2013   Dr. Oneida Alar: normal esophagus, moderate nodular gastritis, negative path, empiric Savary dilation  . EYE SURGERY  07/2017   sx for glaucoma  . FLEXIBLE SIGMOIDOSCOPY N/A 05/17/2013   Dr. Oneida Alar: moderate sized internal hemorrhoids  . HEMORRHOID BANDING N/A 05/17/2013   Procedure: HEMORRHOID BANDING;  Surgeon: [name], MD;  Location: [facility];  Service: Endoscopy;  Laterality: N/A;  2 bands placed  . INCISION AND DRAINAGE ABSCESS Left 10/11/2013   Procedure: INCISION AND DRAINAGE AND DEBRIDEMENT LEFT BREAST  ABSCESS;  Surgeon: [name], MD;  Location: [facility];  Service: General;  Laterality: Left;  . INSERTION OF DIALYSIS CATHETER N/A 06/08/2018   Procedure: INSERTION OF Right internal jugular TUNNELED  DIALYSIS CATHETER;  Surgeon: Angelia Mould, MD;  Location: [place];  Service: Vascular;  Laterality: N/A;  . IRRIGATION AND DEBRIDEMENT ABSCESS Right 06/01/2013   Procedure: INCISION AND DRAINAGE AND DEBRIDEMENT ABSCESS RIGHT BREAST;  Surgeon: [name], MD;  Location[facility];  Service: General;  Laterality: Right;  . LEFT EYE  REMOVED Left 01/2018   [employer] on [address].  Marland Kitchen LIGATION OF ARTERIOVENOUS  FISTULA Right 06/08/2018   Procedure: LIGATION OF ARTERIOVENOUS  FISTULA RIGHT ARM;  Surgeon: Angelia Mould, MD;  Location: [place];  Service: Vascular;  Laterality: Right;  . ORIF ANKLE FRACTURE Right 12/17/2018   Procedure: OPEN REDUCTION INTERNAL FIXATION (ORIF) ANKLE FRACTURE;  Surgeon: Edrick Kins, DPM;  Location: [place];  Service: Podiatry;  Laterality: Right;  . SAVORY DILATION N/A 05/17/2013   Procedure: SAVORY DILATION;  Surgeon: [name], MD;  [facility];  Service: Endoscopy;  Laterality: N/A;  14/15/16  . SKIN FULL THICKNESS GRAFT Left 05/05/2018    Procedure: ABDOMINAL DERMIS FAT SKIN GRAFT FULL THICKNESS LEFT EYE;  Surgeon: Clista Bernhardt, MD;  Location: [place];  Service: Ophthalmology;  Laterality: Left;  . TUBAL LIGATION       OB History    Gravida  2   Para  2   Term  1   Preterm  1   AB      Living  2     SAB      TAB      Ectopic      Multiple      Live Births              Family History  Adopted: Yes  Family history unknown: Yes    Social History   Tobacco Use  . Smoking status: Former Smoker    Packs/day: 1.50    Years: 25.00    Pack years: 37.50    Types: Cigarettes    Quit date: 10/23/2007    Years since quitting: 12.3  . Smokeless tobacco: Never Used  Vaping Use  . Vaping Use: Never used  Substance Use Topics  . Alcohol use: No  . Drug use: No    Home Medications Prior to Admission medications   Medication Sig Start Date End Date Taking? Authorizing Provider  albuterol (PROVENTIL) (2.5 MG/3ML) 0.083% nebulizer solution Take 2.5 mg by nebulization every 6 (six) hours as needed for wheezing or shortness of breath.    [provider]  ALPRAZolam Duanne Moron) 1 MG tablet Take 1 mg by mouth 4 (four) times daily as needed for anxiety.    [provider]  atorvastatin (LIPITOR) 20 MG tablet Take 20 mg by mouth daily.     [provider]  baclofen (LIORESAL) 10 MG tablet Take 10 mg by mouth 2 (two) times daily.    [provider]  bisacodyl (DULCOLAX) 10 MG suppository Place 1 suppository (10 mg total) rectally daily as needed for moderate constipation. 12/03/18   Sinda Du, MD  busPIRone (BUSPAR) 7.5 MG tablet Take 7.5 mg by mouth 2 (two) times daily.    [provider]  calcium carbonate (TUMS EX) 750 MG chewable tablet Chew 2-4 tablets by mouth See admin instructions. Take 4 tablet with each meal & take 2 tablets with each snack    [provider]  cetirizine (ZYRTEC) 10 MG tablet Take 10 mg by mouth daily.    [provider]   cinacalcet (SENSIPAR) 30 MG tablet Take 30 mg by mouth daily.    [provider]  cloNIDine (CATAPRES) 0.1 MG tablet Take 0.1 mg by mouth 2 (two) times daily.    [provider]  colchicine 0.6 MG tablet Take 1 tablet (0.6 mg total) by mouth every Tuesday, Thursday, and Saturday at 6 PM. Take 1 tablet every  other day Patient taking differently: Take 0.6 mg by mouth every Tuesday, Thursday, and Saturday at 6 PM.  09/30/18   Johnson, Clanford L, MD  cyclobenzaprine (FLEXERIL) 10 MG tablet Take 1 tablet (10 mg total) by mouth 2 (two) times daily as needed for muscle spasms. 08/17/19   Milton Ferguson, MD  diphenhydrAMINE (BENADRYL) 25 mg capsule Take 1 capsule (25 mg total) by mouth every 6 (six) hours as needed for itching. 12/03/18   Sinda Du, MD  doxercalciferol (HECTOROL) 4 MCG/2ML injection Inject 0.5 mLs (1 mcg total) into the vein every Monday, Wednesday, and Friday with hemodialysis. 12/03/18   Sinda Du, MD  epoetin alfa (EPOGEN) 10000 UNIT/ML injection Inject 1 mL (10,000 Units total) into the vein 3 (three) times a week. 12/03/18   Sinda Du, MD  gabapentin (NEURONTIN) 300 MG capsule Take 300 mg by mouth at bedtime.    [provider]  HYDROcodone-acetaminophen (NORCO) 10-325 MG tablet Take 1 tablet by mouth every 6 (six) hours as needed (pain.). Prescription needed for SNF admission Patient taking differently: Take 1 tablet by mouth 4 (four) times daily. Prescription needed for SNF admission 12/19/18   Donne Hazel, MD  insulin aspart (NOVOLOG) 100 UNIT/ML injection Inject 0-9 Units into the skin 3 (three) times daily with meals. Patient taking differently: Inject 2-15 Units into the skin 3 (three) times daily with meals. Sliding scale insulin 12/02/18   Sinda Du, MD  Insulin Glargine [facility]) 100 UNIT/ML SOPN Inject 30 Units into the skin at bedtime.     [provider]  lidocaine-prilocaine (EMLA) cream Apply 1 application  topically as needed (topical anesthesia for hemodialysis if Gebauers and Lidocaine injection are ineffective.). Patient taking differently: Apply 1 application topically daily as needed (dialysis.).  04/25/17   Johnson, Clanford L, MD  losartan (COZAAR) 25 MG tablet Take 25 mg by mouth daily.    [provider]  omeprazole (PRILOSEC) 40 MG capsule Take 40 mg by mouth daily.    [provider]  ondansetron (ZOFRAN) 4 MG tablet Take 1 tablet (4 mg total) by mouth every 6 (six) hours as needed for nausea. Patient taking differently: Take 4 mg by mouth every 4 (four) hours as needed for nausea.  12/02/18   Sinda Du, MD  PARoxetine (PAXIL) 20 MG tablet Take 1 daily. This is to prevent panic attacks Patient taking differently: Take 20 mg by mouth every evening. This is to prevent panic attacks 10/28/16   Sinda Du, MD  polyethylene glycol (MIRALAX / GLYCOLAX) 17 g packet Take 17 g by mouth daily as needed for mild constipation. 12/02/18   Sinda Du, MD  torsemide (DEMADEX) 100 MG tablet Take 100 mg by mouth 2 (two) times daily.    [provider]    Allergies    Codeine and Tape  Review of Systems   Review of Systems  Constitutional: Negative for fever.  Respiratory: Positive for shortness of breath.   Cardiovascular: Negative for chest pain.  Gastrointestinal: Positive for abdominal pain.  Genitourinary: Positive for dysuria and flank pain.  All other systems reviewed and are negative.   Physical Exam Updated Vital Signs BP 126/71   Pulse 65   Temp 98.8 F (37.1 C)   Resp 20   Ht 1.702 m (5\' 7" )   Wt (!) 149.7 kg   LMP 08/03/2008 (Exact Date)   SpO2 96%   BMI 51.69 kg/m   Physical Exam CONSTITUTIONAL: Chronically ill-appearing HEAD: Normocephalic/atraumatic EYES: Enucleated left  eye ENMT: Mucous membranes moist NECK: supple no meningeal signs SPINE/BACK:entire spine nontender CV: S1/S2 noted LUNGS: Lungs are clear to auscultation  bilaterally, no apparent distress ABDOMEN: soft, obese.  Diffuse left upper and left lower quadrant tenderness.  Erythema noted to LUQ.  See photo below No crepitus GU: Left cva tenderness NEURO: Pt is awake/alert/appropriate, moves all extremitiesx4.  No facial droop.  No focal weakness in the legs EXTREMITIES: pulses normal/equal, full ROM, dialysis access to left arm with thrill noted SKIN: warm, color normal PSYCH: Anxious      ED Results / Procedures / Treatments   Labs (all labs ordered are listed, but only abnormal results are displayed) Labs Reviewed  URINALYSIS, ROUTINE W REFLEX MICROSCOPIC - Abnormal; Notable for the following components:      Result Value   APPearance HAZY (*)    Glucose, UA 150 (*)    Hgb urine dipstick SMALL (*)    Protein, ur >=300 (*)    Bacteria, UA RARE (*)    All other components within normal limits  CBC WITH DIFFERENTIAL/PLATELET - Abnormal; Notable for the following components:   Hemoglobin 10.9 (*)    HCT 35.6 (*)    RDW 16.6 (*)    Platelets 111 (*)    All other components within normal limits  COMPREHENSIVE METABOLIC PANEL - Abnormal; Notable for the following components:   Potassium 3.0 (*)    Glucose, Bld 147 (*)    BUN 26 (*)    Creatinine, Ser 6.26 (*)    AST 7 (*)    Alkaline Phosphatase 159 (*)    Total Bilirubin 1.4 (*)    GFR calc non Af Amer 7 (*)    GFR calc Af Amer 8 (*)    All other components within normal limits  URINE CULTURE  LIPASE, BLOOD    EKG None  Radiology CT Renal Stone Study  Result Date: 03/06/2020 CLINICAL DATA:  Flank pain EXAM: CT ABDOMEN AND PELVIS WITHOUT CONTRAST TECHNIQUE: Multidetector CT imaging of the abdomen and pelvis was performed following the standard protocol without IV contrast. COMPARISON:  September 04, 2015 FINDINGS: Lower chest: The visualized heart size within normal limits. No pericardial fluid/thickening. No hiatal hernia. Patchy airspace opacity seen at the left lung base.  Hepatobiliary: Although limited due to the lack of intravenous contrast, normal in appearance without gross focal abnormality. The patient is status post cholecystectomy. No biliary ductal dilation. Pancreas:  Unremarkable.  No surrounding inflammatory changes. Spleen: Again noted is mild prominence of the spleen. There is area of subtle area of hypoattenuation seen at the inferior posterior aspect of the spleen, best seen on series 2, image 25. There is also minimal adjacent fat stranding changes seen adjacent to the inferior spleen. Adrenals/Urinary Tract: Both adrenal glands appear normal. Mild bilateral renal atrophy with fat stranding changes are seen. No renal or collecting system calculi are noted. No hydronephrosis is noted. Bladder is unremarkable. Stomach/Bowel: The stomach, small bowel, and colon are normal in appearance. No inflammatory changes or obstructive findings. Scattered colonic diverticula are seen. Vascular/Lymphatic: There are no enlarged abdominal or pelvic lymph nodes. Scattered aortic atherosclerotic calcifications are seen without aneurysmal dilatation. Reproductive: The uterus and adnexa are unremarkable. Other: No evidence of abdominal wall mass or hernia. Musculoskeletal: No acute or significant osseous findings. There is diffuse osteopenia. There is endplate Schmorl's nodes are seen throughout the thoracolumbar spine. IMPRESSION: 1. Patchy airspace opacity at the left lung base which may be due to atelectasis and/or  early infectious etiology. 2. Subtle area of hypoattenuation at the inferior posterior spleen with minimal fat stranding changes which is nonspecific may be due to infectious or inflammatory process. If further evaluation is required would recommend abdominal ultrasound or contrast-enhanced CT. 3.  Aortic Atherosclerosis (ICD10-I70.0). Electronically Signed   By: Prudencio Pair M.D.   On: 03/06/2020 02:10    Procedures Procedures   Medications Ordered in ED Medications   HYDROcodone-acetaminophen (NORCO/VICODIN) 5-325 MG per tablet 1 tablet (has no administration in time range)  clindamycin (CLEOCIN) capsule 300 mg (has no administration in time range)  fentaNYL (SUBLIMAZE) injection 100 mcg (100 mcg Intramuscular Given 03/06/20 0221)  ondansetron (ZOFRAN-ODT) disintegrating tablet 4 mg (4 mg Oral Given 03/06/20 0220)    ED Course  I have reviewed the triage vital signs and the nursing notes.  Pertinent labs & imaging results that were available during my care of the patient were reviewed by me and considered in my medical decision making (see chart for details).    MDM Rules/Calculators/A&P                         12:40 AM Patient with extensive medical history presenting with flank and abdominal pain.  She suspects urinary tract infection.  Will obtain CT renal as well as urinalysis.  Patient is also noted to have what appears to be a cellulitis of her left upper quadrant.  No obvious abscess, no crepitus.  4:59 AM Patient improved.  She is resting comfortably BP (!) 134/50 (BP Location: Right Arm)   Pulse 68   Temp 97.9 F (36.6 C) (Oral)   Resp 17   Ht 1.702 m (5\' 7" )   Wt (!) 149.7 kg   LMP 08/03/2008 (Exact Date)   SpO2 96%   BMI 51.69 kg/m  Vitals are appropriate, she is not septic appearing.  No acute findings on CT imaging, low suspicion for pneumonia given CT findings.  Likely atelectasis.  Doubt splenic findings are acute in nature.  She has PCP follow-up next week and can have a repeat CT scan at that time. Patient does have abdominal wall cellulitis without any complicating features. I feel she is appropriate for outpatient management. We will start on clindamycin.  She can continue dialysis. I advised that she should have a friend or family member monitor the wound to see if the cellulitis is worsening.  If there is any worsening over the next 48 hours she is to return to the ER Patient otherwise appropriate discharge home when she has  PCP follow-up next week. Patient agreeable with plan   This patient presents to the ED for concern of flank pain, this involves an extensive number of treatment options, and is a complaint that carries with it a high risk of complications and morbidity.  The differential diagnosis includes pyelonephritis, kidney stone, diverticulitis, pancreatitis   Lab Tests:   I Ordered, reviewed, and interpreted labs, which included hepatic function panel, electrolytes, complete blood count, urinalysis, lipase  Medicines ordered:   I ordered medication fentanyl for pain  Imaging Studies ordered:   I ordered imaging studies which included CT renal   I independently visualized and interpreted imaging which showed no acute findings   Additional history obtained:   Previous records obtained and reviewed     Reevaluation:  After the interventions stated above, I reevaluated the patient and found patient is improved    Final Clinical Impression(s) / ED Diagnoses Final diagnoses:  Cellulitis of abdominal wall  ESRD (end stage renal disease) ([place])  Acute left-sided low back pain without sciatica    Rx / DC Orders ED Discharge Orders         Ordered    clindamycin (CLEOCIN) 300 MG capsule  3 times daily     Discontinue  Reprint     03/06/20 0454           [name], MD 03/06/20 0500

## 2020-03-06 NOTE — ED Notes (Signed)
Bladder scan showed 112 mL of urine

## 2020-03-06 NOTE — Discharge Instructions (Signed)
Have someone look at your wound every day to see if the redness is worsening. Please see your doctor next week.  You will likely need to have another CAT scan of your abdomen with contrast to see what is causing your pain.

## 2020-03-07 LAB — URINE CULTURE: Culture: NO GROWTH

## 2020-07-02 ENCOUNTER — Emergency Department (HOSPITAL_COMMUNITY)
Admission: EM | Admit: 2020-07-02 | Discharge: 2020-07-02 | Disposition: A | Discharge status: Home / Self Care | Payer: Medicaid Other | Attending: Emergency Medicine | Admitting: Emergency Medicine

## 2020-07-02 ENCOUNTER — Emergency Department (HOSPITAL_COMMUNITY): Payer: Medicaid Other

## 2020-07-02 ENCOUNTER — Encounter (HOSPITAL_COMMUNITY): Payer: Self-pay | Admitting: Emergency Medicine

## 2020-07-02 ENCOUNTER — Other Ambulatory Visit: Payer: Self-pay

## 2020-07-02 DIAGNOSIS — I132 Hypertensive heart and chronic kidney disease with heart failure and with stage 5 chronic kidney disease, or end stage renal disease: Secondary | ICD-10-CM | POA: Insufficient documentation

## 2020-07-02 DIAGNOSIS — J449 Chronic obstructive pulmonary disease, unspecified: Secondary | ICD-10-CM | POA: Diagnosis not present

## 2020-07-02 DIAGNOSIS — Z9114 Patient's other noncompliance with medication regimen: Secondary | ICD-10-CM | POA: Diagnosis not present

## 2020-07-02 DIAGNOSIS — Z20822 Contact with and (suspected) exposure to covid-19: Secondary | ICD-10-CM | POA: Insufficient documentation

## 2020-07-02 DIAGNOSIS — Z87891 Personal history of nicotine dependence: Secondary | ICD-10-CM | POA: Insufficient documentation

## 2020-07-02 DIAGNOSIS — F410 Panic disorder [episodic paroxysmal anxiety] without agoraphobia: Secondary | ICD-10-CM | POA: Diagnosis not present

## 2020-07-02 DIAGNOSIS — N186 End stage renal disease: Secondary | ICD-10-CM | POA: Diagnosis not present

## 2020-07-02 DIAGNOSIS — E1122 Type 2 diabetes mellitus with diabetic chronic kidney disease: Secondary | ICD-10-CM | POA: Diagnosis not present

## 2020-07-02 DIAGNOSIS — Z79899 Other long term (current) drug therapy: Secondary | ICD-10-CM | POA: Diagnosis not present

## 2020-07-02 DIAGNOSIS — M79602 Pain in left arm: Secondary | ICD-10-CM | POA: Insufficient documentation

## 2020-07-02 DIAGNOSIS — I509 Heart failure, unspecified: Secondary | ICD-10-CM | POA: Diagnosis not present

## 2020-07-02 DIAGNOSIS — Z992 Dependence on renal dialysis: Secondary | ICD-10-CM | POA: Diagnosis not present

## 2020-07-02 DIAGNOSIS — F329 Major depressive disorder, single episode, unspecified: Secondary | ICD-10-CM | POA: Insufficient documentation

## 2020-07-02 DIAGNOSIS — Z794 Long term (current) use of insulin: Secondary | ICD-10-CM | POA: Insufficient documentation

## 2020-07-02 LAB — CBC WITH DIFFERENTIAL/PLATELET
Abs Immature Granulocytes: 0.03 10*3/uL (ref 0.00–0.07)
Basophils Absolute: 0 10*3/uL (ref 0.0–0.1)
Basophils Relative: 0 %
Eosinophils Absolute: 0.1 10*3/uL (ref 0.0–0.5)
Eosinophils Relative: 1 %
HCT: 35.2 % — ABNORMAL LOW (ref 36.0–46.0)
Hemoglobin: 11.1 g/dL — ABNORMAL LOW (ref 12.0–15.0)
Immature Granulocytes: 0 %
Lymphocytes Relative: 20 %
Lymphs Abs: 1.5 10*3/uL (ref 0.7–4.0)
MCH: 30.2 pg (ref 26.0–34.0)
MCHC: 31.5 g/dL (ref 30.0–36.0)
MCV: 95.7 fL (ref 80.0–100.0)
Monocytes Absolute: 0.5 10*3/uL (ref 0.1–1.0)
Monocytes Relative: 7 %
Neutro Abs: 5.2 10*3/uL (ref 1.7–7.7)
Neutrophils Relative %: 72 %
Platelets: 108 10*3/uL — ABNORMAL LOW (ref 150–400)
RBC: 3.68 MIL/uL — ABNORMAL LOW (ref 3.87–5.11)
RDW: 17.6 % — ABNORMAL HIGH (ref 11.5–15.5)
WBC: 7.4 10*3/uL (ref 4.0–10.5)
nRBC: 0 % (ref 0.0–0.2)

## 2020-07-02 LAB — BASIC METABOLIC PANEL
Anion gap: 18 — ABNORMAL HIGH (ref 5–15)
BUN: 65 mg/dL — ABNORMAL HIGH (ref 6–20)
CO2: 21 mmol/L — ABNORMAL LOW (ref 22–32)
Calcium: 8.5 mg/dL — ABNORMAL LOW (ref 8.9–10.3)
Chloride: 94 mmol/L — ABNORMAL LOW (ref 98–111)
Creatinine, Ser: 12.85 mg/dL — ABNORMAL HIGH (ref 0.44–1.00)
GFR, Estimated: 3 mL/min — ABNORMAL LOW (ref >60)
Glucose, Bld: 168 mg/dL — ABNORMAL HIGH (ref 70–99)
Potassium: 4.5 mmol/L (ref 3.5–5.1)
Sodium: 133 mmol/L — ABNORMAL LOW (ref 135–145)

## 2020-07-02 LAB — RESP PANEL BY RT-PCR (FLU A&B, COVID) ARPGX2
Influenza A by PCR: NEGATIVE
Influenza B by PCR: NEGATIVE
SARS Coronavirus 2 by RT PCR: NEGATIVE

## 2020-07-02 MED ORDER — HYDROMORPHONE HCL 1 MG/ML IJ SOLN
1.0000 mg | Freq: Once | INTRAMUSCULAR | Status: AC
  Administered 2020-07-02: 1 mg via INTRAVENOUS
  Filled 2020-07-02: qty 1

## 2020-07-02 MED ORDER — ONDANSETRON HCL 4 MG/2ML IJ SOLN
4.0000 mg | Freq: Once | INTRAMUSCULAR | Status: AC
  Administered 2020-07-02: 4 mg via INTRAVENOUS
  Filled 2020-07-02: qty 2

## 2020-07-02 NOTE — ED Triage Notes (Signed)
Pt comes from dialysis via [facility]. Pt reports left arm pain that started Saturday. Pt has fistula in left arm. No redness, warmth, or deformity noted in the extremity.

## 2020-07-02 NOTE — ED Provider Notes (Signed)
[facility] EMERGENCY DEPARTMENT Provider Note   CSN: [id number] Arrival date & time: 07/02/20  1884     History Chief Complaint  Patient presents with  . Arm Pain    Martha [name] is a 53 y.o. female.  Patient is a dialysis patient has been for several years.  Normally dialyzed Monday Wednesday and Friday.  Went to dialysis today but because of the left arm pain she was referred here.  Patient last dialyzed on Friday.  The left arm pain which is more in the distal forearm and hand area started on Saturday.  Patient also has a history of chronic pain.  Is on chronic pain medications.  Had prescription recently filled.  Patient when she first got here had an oxygen saturation that went below 90.  But not sure if that was erroneous.  Patient was on 2 L of oxygen and was satting 99%.  Eventually we took the oxygen off oxygen levels remained above 90%.  No shortness of breath.  No chest pain.  No abdominal pain.        Past Medical History:  Diagnosis Date  . Acid reflux    takes Tums  . Anemia   . Arthritis   . Bipolar 1 disorder ([place])   . Blindness of left eye    left eye removed  . Carpal tunnel syndrome, bilateral   . Cervical radiculopathy   . CHF (congestive heart failure) ([place])   . Chronic kidney disease    Stage 5- 01/25/17  . Constipation   . COPD (chronic obstructive pulmonary disease) ([place]) 2014   bronchitis  . Degenerative disc disease, thoracic   . Depression   . Diabetes mellitus    Type II  . Diabetic retinopathy ([place])   . Dyspnea    when walking  . End stage kidney disease ([place])    M, W, F [place]   . Gout   . Hypertension   . Noncompliance with medication regimen   . Noncompliance with medication regimen   . Obesity (BMI 30-39.9)   . OSA (obstructive sleep apnea)    cpap  . Panic attack   . RLS (restless legs syndrome)     Patient Active Problem List   Diagnosis Date Noted  . Trimalleolar fracture of ankle, closed, right, initial  encounter 12/17/2018  . Pain and swelling of ankle, right 12/16/2018  . Metabolic encephalopathy 16/60/6301  . SIRS (systemic inflammatory response syndrome) ([place]) 06/23/2018  . UTI (urinary tract infection) 02/26/2018  . Volume overload 02/25/2018  . 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 Dialysis patient ([place]) 12/13/2017  . ESRD (end stage renal disease) on dialysis ([place])   . Chronic diarrhea 10/08/2017  . Anemia in chronic kidney disease 05/24/2017  . Chronic obstructive pulmonary disease ([place]) 05/24/2017  . Heart failure ([place]) 05/24/2017  . 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 Renal failure (ARF), acute on chronic (HCC) 04/22/2017  . Anasarca associated with disorder of kidney 04/20/2017  . Protein-calorie malnutrition, severe 11/29/2016  . HTN (hypertension), malignant 11/20/2016  . Bipolar 1 disorder ([place]) 11/20/2016  . Noncompliance with medication regimen 11/20/2016  . Panic attack 11/20/2016  . Uncontrolled type 2 diabetes mellitus with hyperglycemia, without long-term current use of insulin ([place]) 10/27/2016  . Sensory disturbance 10/27/2016  . Left hemiparesis ([place]) 10/27/2016  . Cholelithiasis 04/26/2014  . Hyponatremia 04/26/2014  . Gallstones 04/06/2014  . Diabetic ulcer of left great toe ([place]) 10/09/2013  .  Type 2 diabetes mellitus treated with insulin ([place]) 05/30/2013  . Hyperglycemia 05/30/2013  . Internal hemorrhoids with other complication 54/62/7035  . Umbilical hernia without mention of obstruction or gangrene 04/15/2013  . Anemia 04/15/2013  . Morbid obesity with BMI of 50.0-59.9, adult ([place]) 10/08/2012  . DM 05/13/2010  . Hyperlipidemia 05/13/2010  . Morbid obesity ([place]) 05/13/2010  . Depression with anxiety 05/13/2010  . RESTLESS LEG SYNDROME 05/13/2010  . Essential hypertension 05/13/2010  . GERD 05/13/2010  . RECTAL BLEEDING 05/13/2010  . Sleep apnea 05/13/2010  . Dysphagia, oropharyngeal phase  05/13/2010    Past Surgical History:  Procedure Laterality Date  . AV FISTULA PLACEMENT Right 01/27/2017   Procedure: ARTERIOVENOUS (AV) FISTULA CREATION-RIGHT ARM;  Surgeon: Elam Dutch, MD;  Location: [facility] OR;  Service: Vascular;  Laterality: Right;  . AV FISTULA PLACEMENT Left 08/03/2018   Procedure: LEFT ARTERIOVENOUS (AV) FISTULA CREATION;  Surgeon: Elam Dutch, MD;  Location: [place];  Service: Vascular;  Laterality: Left;  . BASCILIC VEIN TRANSPOSITION Left 04/26/2019   Procedure: SECOND STAGE BASILIC VEIN TRANSPOSITION LEFT ARM;  Surgeon: Elam Dutch, MD;  Location: [place];  Service: Vascular;  Laterality: Left;  . BIOPSY N/A 05/17/2013   Procedure: BIOPSY;  Surgeon: [name], MD;  Location: [facility];  Service: Endoscopy;  Laterality: N/A;  . CARPAL TUNNEL RELEASE Left 11/25/2018   Procedure: LEFT CARPAL TUNNEL RELEASE;  Surgeon: Charlotte Crumb, MD;  Location: [place];  Service: Orthopedics;  Laterality: Left;  . CATARACT EXTRACTION Right   . CHOLECYSTECTOMY N/A 04/27/2014   Procedure: LAPAROSCOPIC CHOLECYSTECTOMY;  Surgeon: [name], MD;  Location: [facility];  Service: General;  Laterality: N/A;  . COLONOSCOPY  06/06/2010   KKX:FGHWEX bleeding secondary to internal hemorrhoids but incomplete evaluation secondary to poor right colon prep/small rectal and sigmoid colon polyps (hyperplastic). PROPOFOL  . COLONOSCOPY  May 2013   Dr. Gilliam/[facility]: 5 mm a descending colon polyp, hyperplastic. Adequate bowel prep.  . ENDOMETRIAL ABLATION    . 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 ENUCLEATION Left 02/04/2018   Procedure: ENUCLEATION WITH PLACEMENT OF IMPLANT LEFT EYE;  Surgeon: Clista Bernhardt, MD;  Location: [place];  Service: Ophthalmology;  Laterality: Left;  . ESOPHAGOGASTRODUODENOSCOPY  06/06/2010   HBZ:JIRCVEL erythema and edema of body of stomach, with sessile polypoid lesions. bx benign. no h.pylori  .  ESOPHAGOGASTRODUODENOSCOPY (EGD) WITH PROPOFOL N/A 05/17/2013   Dr. Oneida Alar: normal esophagus, moderate nodular gastritis, negative path, empiric Savary dilation  . EYE SURGERY  07/2017   sx for glaucoma  . FLEXIBLE SIGMOIDOSCOPY N/A 05/17/2013   Dr. Oneida Alar: moderate sized internal hemorrhoids  . HEMORRHOID BANDING N/A 05/17/2013   Procedure: HEMORRHOID BANDING;  Surgeon: [name], MD;  Location[facility];  Service: Endoscopy;  Laterality: N/A;  2 bands placed  . INCISION AND DRAINAGE ABSCESS Left 10/11/2013   Procedure: INCISION AND DRAINAGE AND DEBRIDEMENT LEFT BREAST  ABSCESS;  Surgeon: [name], MD;  Location: [facility];  Service: General;  Laterality: Left;  . INSERTION OF DIALYSIS CATHETER N/A 06/08/2018   Procedure: INSERTION OF Right internal jugular TUNNELED  DIALYSIS CATHETER;  Surgeon: Angelia Mould, MD;  Location: [place];  Service: Vascular;  Laterality: N/A;  . IRRIGATION AND DEBRIDEMENT ABSCESS Right 06/01/2013   Procedure: INCISION AND DRAINAGE AND DEBRIDEMENT ABSCESS RIGHT BREAST;  Surgeon: [name], MD;  [facility];  Service: General;  Laterality: Right;  .  LEFT EYE REMOVED Left 01/2018   [facility] [address].  Marland Kitchen LIGATION OF ARTERIOVENOUS  FISTULA Right 06/08/2018   Procedure: LIGATION OF ARTERIOVENOUS  FISTULA RIGHT ARM;  Surgeon: Angelia Mould, MD;  Location: [place];  Service: Vascular;  Laterality: Right;  . ORIF ANKLE FRACTURE Right 12/17/2018   Procedure: OPEN REDUCTION INTERNAL FIXATION (ORIF) ANKLE FRACTURE;  Surgeon: Edrick Kins, DPM;  Location: [place];  Service: Podiatry;  Laterality: Right;  . SAVORY DILATION N/A 05/17/2013   Procedure: SAVORY DILATION;  Surgeon: [name], MD;  [facility];  Service: Endoscopy;  Laterality: N/A;  14/15/16  . SKIN FULL THICKNESS GRAFT Left 05/05/2018   Procedure: ABDOMINAL DERMIS FAT SKIN GRAFT FULL THICKNESS LEFT EYE;  Surgeon: Clista Bernhardt, MD;  Location: [place];  Service: Ophthalmology;  Laterality: Left;  . TUBAL LIGATION       OB History    Gravida  2   Para  2   Term  1   Preterm  1   AB      Living  2     SAB      TAB      Ectopic      Multiple      Live Births              Family History  Adopted: Yes  Family history unknown: Yes    Social History   Tobacco Use  . Smoking status: Former Smoker    Packs/day: 1.50    Years: 25.00    Pack years: 37.50    Types: Cigarettes    Quit date: 10/23/2007    Years since quitting: 12.7  . Smokeless tobacco: Never Used  Vaping Use  . Vaping Use: Never used  Substance Use Topics  . Alcohol use: No  . Drug use: No    Home Medications Prior to Admission medications   Medication Sig Start Date End Date Taking? Authorizing Provider  albuterol (PROVENTIL) (2.5 MG/3ML) 0.083% nebulizer solution Take 2.5 mg by nebulization every 6 (six) hours as needed for wheezing or shortness of breath.    [provider]  ALPRAZolam Duanne Moron) 1 MG tablet Take 1 mg by mouth 4 (four) times daily as needed for anxiety.    [provider]  atorvastatin (LIPITOR) 20 MG tablet Take 20 mg by mouth daily.     [provider]  baclofen (LIORESAL) 10 MG tablet Take 10 mg by mouth 2 (two) times daily.    [provider]  bisacodyl (DULCOLAX) 10 MG suppository Place 1 suppository (10 mg total) rectally daily as needed for moderate constipation. 12/03/18   Sinda Du, MD  busPIRone (BUSPAR) 7.5 MG tablet Take 7.5 mg by mouth 2 (two) times daily.    [provider]  calcium carbonate (TUMS EX) 750 MG chewable tablet Chew 2-4 tablets by mouth See admin instructions. Take 4 tablet with each meal & take 2 tablets with each snack    [provider]  cetirizine (ZYRTEC) 10 MG tablet Take 10 mg by mouth daily.    [provider]  cinacalcet (SENSIPAR) 30 MG tablet Take 30 mg by mouth daily.    [provider]  clindamycin (CLEOCIN)  300 MG capsule Take 1 capsule (300 mg total) by mouth 3 (three) times daily. X 7 days 03/06/20   [name], MD  cloNIDine (CATAPRES) 0.1 MG tablet Take 0.1 mg by mouth 2 (two) times daily.  [provider]  colchicine 0.6 MG tablet Take 1 tablet (0.6 mg total) by mouth every Tuesday, Thursday, and Saturday at 6 PM. Take 1 tablet every other day Patient taking differently: Take 0.6 mg by mouth every Tuesday, Thursday, and Saturday at 6 PM.  09/30/18   Johnson, Clanford L, MD  cyclobenzaprine (FLEXERIL) 10 MG tablet Take 1 tablet (10 mg total) by mouth 2 (two) times daily as needed for muscle spasms. 08/17/19   Milton Ferguson, MD  diphenhydrAMINE (BENADRYL) 25 mg capsule Take 1 capsule (25 mg total) by mouth every 6 (six) hours as needed for itching. 12/03/18   Sinda Du, MD  doxercalciferol (HECTOROL) 4 MCG/2ML injection Inject 0.5 mLs (1 mcg total) into the vein every Monday, Wednesday, and Friday with hemodialysis. 12/03/18   Sinda Du, MD  epoetin alfa (EPOGEN) 10000 UNIT/ML injection Inject 1 mL (10,000 Units total) into the vein 3 (three) times a week. 12/03/18   Sinda Du, MD  gabapentin (NEURONTIN) 300 MG capsule Take 300 mg by mouth at bedtime.    [provider]  HYDROcodone-acetaminophen (NORCO) 10-325 MG tablet Take 1 tablet by mouth every 6 (six) hours as needed (pain.). Prescription needed for SNF admission Patient taking differently: Take 1 tablet by mouth 4 (four) times daily. Prescription needed for SNF admission 12/19/18   Donne Hazel, MD  insulin aspart (NOVOLOG) 100 UNIT/ML injection Inject 0-9 Units into the skin 3 (three) times daily with meals. Patient taking differently: Inject 2-15 Units into the skin 3 (three) times daily with meals. Sliding scale insulin 12/02/18   Sinda Du, MD  Insulin Glargine [facility]) 100 UNIT/ML SOPN Inject 30 Units into the skin at bedtime.     [provider]  lidocaine-prilocaine (EMLA) cream  Apply 1 application topically as needed (topical anesthesia for hemodialysis if Gebauers and Lidocaine injection are ineffective.). Patient taking differently: Apply 1 application topically daily as needed (dialysis.).  04/25/17   Johnson, Clanford L, MD  losartan (COZAAR) 25 MG tablet Take 25 mg by mouth daily.    [provider]  omeprazole (PRILOSEC) 40 MG capsule Take 40 mg by mouth daily.    [provider]  ondansetron (ZOFRAN) 4 MG tablet Take 1 tablet (4 mg total) by mouth every 6 (six) hours as needed for nausea. Patient taking differently: Take 4 mg by mouth every 4 (four) hours as needed for nausea.  12/02/18   Sinda Du, MD  PARoxetine (PAXIL) 20 MG tablet Take 1 daily. This is to prevent panic attacks Patient taking differently: Take 20 mg by mouth every evening. This is to prevent panic attacks 10/28/16   Sinda Du, MD  polyethylene glycol (MIRALAX / GLYCOLAX) 17 g packet Take 17 g by mouth daily as needed for mild constipation. 12/02/18   Sinda Du, MD  torsemide (DEMADEX) 100 MG tablet Take 100 mg by mouth 2 (two) times daily.    [provider]    Allergies    Codeine and Tape  Review of Systems   Review of Systems  Constitutional: Negative for chills and fever.  HENT: Negative for congestion, rhinorrhea and sore throat.   Eyes: Negative for visual disturbance.  Respiratory: Negative for cough and shortness of breath.   Cardiovascular: Negative for chest pain and leg swelling.  Gastrointestinal: Negative for abdominal pain, diarrhea, nausea and vomiting.  Genitourinary: Negative for dysuria.  Musculoskeletal: Negative for back pain and neck pain.  Skin: Negative for rash.  Neurological: Negative for dizziness, light-headedness  and headaches.  Hematological: Does not bruise/bleed easily.  Psychiatric/Behavioral: Negative for confusion.    Physical Exam Updated Vital Signs BP (!) 133/59 (BP Location: Right Arm)   Pulse 66   Temp  98.5 F (36.9 C) (Oral)   Resp 19   Ht 1.702 m (5\' 7" )   Wt (!) 142 kg   LMP 08/03/2008 (Exact Date)   SpO2 94%   BMI 49.02 kg/m   Physical Exam Vitals and nursing note reviewed.  Constitutional:      General: She is not in acute distress.    Appearance: Normal appearance. She is well-developed. She is not ill-appearing.  HENT:     Head: Normocephalic and atraumatic.  Eyes:     Extraocular Movements: Extraocular movements intact.     Conjunctiva/sclera: Conjunctivae normal.     Pupils: Pupils are equal, round, and reactive to light.  Cardiovascular:     Rate and Rhythm: Normal rate and regular rhythm.     Heart sounds: No murmur heard.   Pulmonary:     Effort: Pulmonary effort is normal. No respiratory distress.     Breath sounds: Normal breath sounds.  Abdominal:     Palpations: Abdomen is soft.     Tenderness: There is no abdominal tenderness.  Musculoskeletal:        General: No deformity.     Cervical back: Neck supple.     Comments: Tenderness to palpation to distal left forearm wrist and hand.  No snuffbox tenderness.  Good cap refill.  Radial pulse is about 1+.  Good thrill in the AV fistula that is in the proximal part of the arm.  Hand is warm.  Not cool to touch.  No erythema.  Discomfort with range of motion.  Of the fingers and wrist.  Skin:    General: Skin is warm and dry.     Capillary Refill: Capillary refill takes less than 2 seconds.  Neurological:     General: No focal deficit present.     Mental Status: She is alert and oriented to person, place, and time.     Cranial Nerves: No cranial nerve deficit.     Sensory: No sensory deficit.     ED Results / Procedures / Treatments   Labs (all labs ordered are listed, but only abnormal results are displayed) Labs Reviewed  BASIC METABOLIC PANEL - Abnormal; Notable for the following components:      Result Value   Sodium 133 (*)    Chloride 94 (*)    CO2 21 (*)    Glucose, Bld 168 (*)    BUN 65 (*)     Creatinine, Ser 12.85 (*)    Calcium 8.5 (*)    GFR, Estimated 3 (*)    Anion gap 18 (*)    All other components within normal limits  CBC WITH DIFFERENTIAL/PLATELET - Abnormal; Notable for the following components:   RBC 3.68 (*)    Hemoglobin 11.1 (*)    HCT 35.2 (*)    RDW 17.6 (*)    Platelets 108 (*)    All other components within normal limits  RESP PANEL BY RT-PCR (FLU A&B, COVID) ARPGX2  CBC WITH DIFFERENTIAL/PLATELET    EKG None  Radiology DG Forearm Left  Result Date: 07/02/2020 CLINICAL DATA:  Left forearm pain EXAM: LEFT FOREARM - 2 VIEW COMPARISON:  None. FINDINGS: No acute bony abnormality. Specifically, no fracture, subluxation, or dislocation. Vascular calcifications noted. IMPRESSION: No acute bony abnormality. Electronically Signed  By: Rolm Baptise M.D.   On: 07/02/2020 11:54   DG Chest Port 1 View  Result Date: 07/02/2020 CLINICAL DATA:  SOB EXAM: PORTABLE CHEST 1 VIEW COMPARISON:  08/25/2019 chest radiograph and prior. FINDINGS: No pneumothorax or pleural effusion. No focal consolidation. Cardiomegaly and prominence of the central pulmonary vessels. No acute osseous abnormality. IMPRESSION: Cardiomegaly and prominence of the central pulmonary vessels. No focal consolidation. Electronically Signed   By: Primitivo Gauze M.D.   On: 07/02/2020 10:17   DG Hand Complete Left  Result Date: 07/02/2020 CLINICAL DATA:  Left forearm and hand pain. EXAM: LEFT HAND - COMPLETE 3+ VIEW COMPARISON:  None. FINDINGS: No acute bony abnormality. Specifically, no fracture, subluxation, or dislocation. Slight joint space narrowing in the IP joints. No bone erosions. Vascular calcifications noted. IMPRESSION: No acute bony abnormality. Electronically Signed   By: Rolm Baptise M.D.   On: 07/02/2020 11:54    Procedures Procedures (including critical care time)  Medications Ordered in ED Medications  ondansetron (ZOFRAN) injection 4 mg (4 mg Intravenous Given 07/02/20 1013)   HYDROmorphone (DILAUDID) injection 1 mg (1 mg Intravenous Given 07/02/20 1014)  HYDROmorphone (DILAUDID) injection 1 mg (1 mg Intravenous Given 07/02/20 1234)    ED Course  I have reviewed the triage vital signs and the nursing notes.  Pertinent labs & imaging results that were available during my care of the patient were reviewed by me and considered in my medical decision making (see chart for details).    MDM Rules/Calculators/A&P                          AV fistula has good thrill seems to be functioning.  Appears to be no vascular compromise to the distal arm.  Good cap refill warm radial pulse 1+.  X-ray of hand and forearm without any bony abnormalities.  Will treat with a sling.  Low patient missed dialysis today.  Labs showed no acute need for dialysis today she can rearrange it for tomorrow.  Does not appear to be any reason why the AV fistula cannot be used  We will have patient follow-up with orthopedics we will have her follow-up with vascular surgery again just to further evaluate the AV fistula.  Will have patient rearrange dialysis.       Final Clinical Impression(s) / ED Diagnoses Final diagnoses:  Left arm pain  ESRD needing dialysis [facility])    Rx / DC Orders ED Discharge Orders    None       Fredia Sorrow, MD 07/02/20 1408

## 2020-07-02 NOTE — Discharge Instructions (Signed)
Wear the sling for comfort.  Make an appointment to follow-up with vascular surgery just for further evaluation of the AV fistula.  Which seems to be functioning fine here today.  Make an appointment with orthopedics for the forearm pain.  Rearrange your dialysis for tomorrow.  Return for any new or worse symptoms.

## 2020-07-02 NOTE — ED Notes (Signed)
This RN to bedside, introduced self to patient at this time. This RN notes that patient is moaning in bed, tearful and stating "It hurts no bad." This RN notes no signs of distress noted. Bed is locked in the lowest position, side rails x2, call bell within reach. Educated on call light and hourly rounding, in agreement at this time. All questions and concerns voiced addressed at this time.

## 2020-07-02 NOTE — ED Notes (Signed)
Handoff report received from [place], [place]

## 2020-07-02 NOTE — ED Notes (Signed)
This RN rounded on patient. Pt reports that pain to left arm is a 7/10, and states "I want food, something to drink and I want to get out of the hallway." Pt educated that staff is currently awaiting lab work prior to food or drink. Pt verbalized understanding at this time. Respirations are even and unlabored with equal chest rise and fall.

## 2020-07-02 NOTE — ED Notes (Signed)
Lab at bedside

## 2020-07-05 ENCOUNTER — Encounter (HOSPITAL_COMMUNITY): Payer: Self-pay | Admitting: Emergency Medicine

## 2020-07-05 ENCOUNTER — Emergency Department (HOSPITAL_COMMUNITY): Payer: Medicaid Other

## 2020-07-05 ENCOUNTER — Other Ambulatory Visit: Payer: Self-pay

## 2020-07-05 ENCOUNTER — Inpatient Hospital Stay (HOSPITAL_COMMUNITY)
Admission: EM | Admit: 2020-07-05 | Discharge: 2020-07-12 | DRG: 177 | Disposition: A | Discharge status: Home / Self Care | Payer: Medicaid Other | Attending: Family Medicine | Admitting: Family Medicine

## 2020-07-05 DIAGNOSIS — G8929 Other chronic pain: Secondary | ICD-10-CM | POA: Diagnosis present

## 2020-07-05 DIAGNOSIS — Z992 Dependence on renal dialysis: Secondary | ICD-10-CM

## 2020-07-05 DIAGNOSIS — J440 Chronic obstructive pulmonary disease with acute lower respiratory infection: Secondary | ICD-10-CM | POA: Diagnosis present

## 2020-07-05 DIAGNOSIS — F319 Bipolar disorder, unspecified: Secondary | ICD-10-CM | POA: Diagnosis present

## 2020-07-05 DIAGNOSIS — I10 Essential (primary) hypertension: Secondary | ICD-10-CM

## 2020-07-05 DIAGNOSIS — M9080 Osteopathy in diseases classified elsewhere, unspecified site: Secondary | ICD-10-CM

## 2020-07-05 DIAGNOSIS — E11319 Type 2 diabetes mellitus with unspecified diabetic retinopathy without macular edema: Secondary | ICD-10-CM | POA: Diagnosis present

## 2020-07-05 DIAGNOSIS — E785 Hyperlipidemia, unspecified: Secondary | ICD-10-CM | POA: Diagnosis present

## 2020-07-05 DIAGNOSIS — Z87891 Personal history of nicotine dependence: Secondary | ICD-10-CM

## 2020-07-05 DIAGNOSIS — I361 Nonrheumatic tricuspid (valve) insufficiency: Secondary | ICD-10-CM | POA: Diagnosis not present

## 2020-07-05 DIAGNOSIS — I509 Heart failure, unspecified: Secondary | ICD-10-CM | POA: Diagnosis present

## 2020-07-05 DIAGNOSIS — R7881 Bacteremia: Secondary | ICD-10-CM | POA: Diagnosis present

## 2020-07-05 DIAGNOSIS — R0902 Hypoxemia: Secondary | ICD-10-CM

## 2020-07-05 DIAGNOSIS — F410 Panic disorder [episodic paroxysmal anxiety] without agoraphobia: Secondary | ICD-10-CM | POA: Diagnosis present

## 2020-07-05 DIAGNOSIS — I953 Hypotension of hemodialysis: Secondary | ICD-10-CM | POA: Diagnosis present

## 2020-07-05 DIAGNOSIS — E1122 Type 2 diabetes mellitus with diabetic chronic kidney disease: Secondary | ICD-10-CM | POA: Diagnosis present

## 2020-07-05 DIAGNOSIS — E119 Type 2 diabetes mellitus without complications: Secondary | ICD-10-CM | POA: Diagnosis not present

## 2020-07-05 DIAGNOSIS — Z6841 Body Mass Index (BMI) 40.0 and over, adult: Secondary | ICD-10-CM

## 2020-07-05 DIAGNOSIS — J189 Pneumonia, unspecified organism: Secondary | ICD-10-CM | POA: Diagnosis present

## 2020-07-05 DIAGNOSIS — N186 End stage renal disease: Secondary | ICD-10-CM | POA: Diagnosis present

## 2020-07-05 DIAGNOSIS — N3000 Acute cystitis without hematuria: Secondary | ICD-10-CM | POA: Diagnosis not present

## 2020-07-05 DIAGNOSIS — E1165 Type 2 diabetes mellitus with hyperglycemia: Secondary | ICD-10-CM | POA: Diagnosis not present

## 2020-07-05 DIAGNOSIS — G4733 Obstructive sleep apnea (adult) (pediatric): Secondary | ICD-10-CM | POA: Diagnosis present

## 2020-07-05 DIAGNOSIS — D631 Anemia in chronic kidney disease: Secondary | ICD-10-CM | POA: Diagnosis present

## 2020-07-05 DIAGNOSIS — Z9841 Cataract extraction status, right eye: Secondary | ICD-10-CM

## 2020-07-05 DIAGNOSIS — G2581 Restless legs syndrome: Secondary | ICD-10-CM | POA: Diagnosis present

## 2020-07-05 DIAGNOSIS — G4730 Sleep apnea, unspecified: Secondary | ICD-10-CM | POA: Diagnosis not present

## 2020-07-05 DIAGNOSIS — Z885 Allergy status to narcotic agent status: Secondary | ICD-10-CM

## 2020-07-05 DIAGNOSIS — Z20822 Contact with and (suspected) exposure to covid-19: Secondary | ICD-10-CM | POA: Diagnosis present

## 2020-07-05 DIAGNOSIS — M25571 Pain in right ankle and joints of right foot: Secondary | ICD-10-CM

## 2020-07-05 DIAGNOSIS — H548 Legal blindness, as defined in USA: Secondary | ICD-10-CM | POA: Diagnosis present

## 2020-07-05 DIAGNOSIS — Z91048 Other nonmedicinal substance allergy status: Secondary | ICD-10-CM

## 2020-07-05 DIAGNOSIS — E8809 Other disorders of plasma-protein metabolism, not elsewhere classified: Secondary | ICD-10-CM | POA: Diagnosis present

## 2020-07-05 DIAGNOSIS — J15212 Pneumonia due to Methicillin resistant Staphylococcus aureus: Principal | ICD-10-CM | POA: Diagnosis present

## 2020-07-05 DIAGNOSIS — J9601 Acute respiratory failure with hypoxia: Secondary | ICD-10-CM | POA: Diagnosis present

## 2020-07-05 DIAGNOSIS — Z794 Long term (current) use of insulin: Secondary | ICD-10-CM

## 2020-07-05 DIAGNOSIS — Z79899 Other long term (current) drug therapy: Secondary | ICD-10-CM

## 2020-07-05 DIAGNOSIS — M1990 Unspecified osteoarthritis, unspecified site: Secondary | ICD-10-CM | POA: Diagnosis present

## 2020-07-05 DIAGNOSIS — Z9114 Patient's other noncompliance with medication regimen: Secondary | ICD-10-CM

## 2020-07-05 DIAGNOSIS — M25471 Effusion, right ankle: Secondary | ICD-10-CM

## 2020-07-05 DIAGNOSIS — N390 Urinary tract infection, site not specified: Secondary | ICD-10-CM | POA: Diagnosis present

## 2020-07-05 DIAGNOSIS — K219 Gastro-esophageal reflux disease without esophagitis: Secondary | ICD-10-CM | POA: Diagnosis present

## 2020-07-05 DIAGNOSIS — I132 Hypertensive heart and chronic kidney disease with heart failure and with stage 5 chronic kidney disease, or end stage renal disease: Secondary | ICD-10-CM | POA: Diagnosis present

## 2020-07-05 DIAGNOSIS — N2581 Secondary hyperparathyroidism of renal origin: Secondary | ICD-10-CM | POA: Diagnosis present

## 2020-07-05 DIAGNOSIS — I340 Nonrheumatic mitral (valve) insufficiency: Secondary | ICD-10-CM | POA: Diagnosis not present

## 2020-07-05 DIAGNOSIS — M109 Gout, unspecified: Secondary | ICD-10-CM | POA: Diagnosis present

## 2020-07-05 DIAGNOSIS — T827XXA Infection and inflammatory reaction due to other cardiac and vascular devices, implants and grafts, initial encounter: Secondary | ICD-10-CM

## 2020-07-05 LAB — COMPREHENSIVE METABOLIC PANEL
ALT: 38 U/L (ref 0–44)
AST: 29 U/L (ref 15–41)
Albumin: 3.3 g/dL — ABNORMAL LOW (ref 3.5–5.0)
Alkaline Phosphatase: 146 U/L — ABNORMAL HIGH (ref 38–126)
Anion gap: 19 — ABNORMAL HIGH (ref 5–15)
BUN: 57 mg/dL — ABNORMAL HIGH (ref 6–20)
CO2: 22 mmol/L (ref 22–32)
Calcium: 8.3 mg/dL — ABNORMAL LOW (ref 8.9–10.3)
Chloride: 91 mmol/L — ABNORMAL LOW (ref 98–111)
Creatinine, Ser: 12.35 mg/dL — ABNORMAL HIGH (ref 0.44–1.00)
GFR, Estimated: 3 mL/min — ABNORMAL LOW (ref >60)
Glucose, Bld: 158 mg/dL — ABNORMAL HIGH (ref 70–99)
Potassium: 4.6 mmol/L (ref 3.5–5.1)
Sodium: 132 mmol/L — ABNORMAL LOW (ref 135–145)
Total Bilirubin: 1.6 mg/dL — ABNORMAL HIGH (ref 0.3–1.2)
Total Protein: 8.1 g/dL (ref 6.5–8.1)

## 2020-07-05 LAB — CBC WITH DIFFERENTIAL/PLATELET
Abs Immature Granulocytes: 0.15 10*3/uL — ABNORMAL HIGH (ref 0.00–0.07)
Basophils Absolute: 0.1 10*3/uL (ref 0.0–0.1)
Basophils Relative: 0 %
Eosinophils Absolute: 0 10*3/uL (ref 0.0–0.5)
Eosinophils Relative: 0 %
HCT: 36.4 % (ref 36.0–46.0)
Hemoglobin: 11.1 g/dL — ABNORMAL LOW (ref 12.0–15.0)
Immature Granulocytes: 1 %
Lymphocytes Relative: 9 %
Lymphs Abs: 1.1 10*3/uL (ref 0.7–4.0)
MCH: 29.5 pg (ref 26.0–34.0)
MCHC: 30.5 g/dL (ref 30.0–36.0)
MCV: 96.8 fL (ref 80.0–100.0)
Monocytes Absolute: 0.8 10*3/uL (ref 0.1–1.0)
Monocytes Relative: 7 %
Neutro Abs: 9.7 10*3/uL — ABNORMAL HIGH (ref 1.7–7.7)
Neutrophils Relative %: 83 %
Platelets: 225 10*3/uL (ref 150–400)
RBC: 3.76 MIL/uL — ABNORMAL LOW (ref 3.87–5.11)
RDW: 17.4 % — ABNORMAL HIGH (ref 11.5–15.5)
WBC: 11.9 10*3/uL — ABNORMAL HIGH (ref 4.0–10.5)
nRBC: 0.2 % (ref 0.0–0.2)

## 2020-07-05 LAB — LIPASE, BLOOD: Lipase: 23 U/L (ref 11–51)

## 2020-07-05 LAB — LACTIC ACID, PLASMA
Lactic Acid, Venous: 1.6 mmol/L (ref 0.5–1.9)
Lactic Acid, Venous: 0.9 mmol/L (ref 0.5–1.9)

## 2020-07-05 LAB — APTT: aPTT: 32 seconds (ref 24–36)

## 2020-07-05 LAB — PROTIME-INR
INR: 1.3 — ABNORMAL HIGH (ref 0.8–1.2)
Prothrombin Time: 16 seconds — ABNORMAL HIGH (ref 11.4–15.2)

## 2020-07-05 MED ORDER — AZITHROMYCIN 250 MG PO TABS
500.0000 mg | ORAL_TABLET | Freq: Once | ORAL | Status: AC
Start: 2020-07-05 — End: 2020-07-05
  Administered 2020-07-05: 500 mg via ORAL
  Filled 2020-07-05: qty 2

## 2020-07-05 MED ORDER — ACETAMINOPHEN 325 MG PO TABS
650.0000 mg | ORAL_TABLET | Freq: Once | ORAL | Status: AC
  Administered 2020-07-05: 650 mg via ORAL
  Filled 2020-07-05: qty 2

## 2020-07-05 MED ORDER — SODIUM CHLORIDE 0.9 % IV SOLN
1.0000 g | Freq: Once | INTRAVENOUS | Status: AC
  Administered 2020-07-05: 1 g via INTRAVENOUS
  Filled 2020-07-05: qty 10

## 2020-07-05 MED ORDER — GABAPENTIN 300 MG PO CAPS
300.0000 mg | ORAL_CAPSULE | Freq: Every day | ORAL | Status: DC
  Administered 2020-07-06 (×2): 300 mg via ORAL
  Filled 2020-07-05 (×2): qty 1

## 2020-07-05 MED ORDER — HEPARIN SODIUM (PORCINE) 5000 UNIT/ML IJ SOLN
5000.0000 [IU] | Freq: Three times a day (TID) | INTRAMUSCULAR | Status: DC
  Administered 2020-07-05 – 2020-07-12 (×21): 5000 [IU] via SUBCUTANEOUS
  Filled 2020-07-05 (×18): qty 1

## 2020-07-05 MED ORDER — IOHEXOL 300 MG/ML  SOLN
100.0000 mL | Freq: Once | INTRAMUSCULAR | Status: AC | PRN
  Administered 2020-07-05: 100 mL via INTRAVENOUS

## 2020-07-05 NOTE — ED Notes (Signed)
Pt continues to speak out in waiting area stating "jesus help me, I can't breath", O2 sat assessed, 90% RA which appears to be baseline, appropriate color and good cap refill. During pt assessment pt states "yall are just going to leave me out here to die & I am not stupid". This RN reassured her that we will get her back to a room as soon as possible.

## 2020-07-05 NOTE — ED Triage Notes (Signed)
Pt c/o left arm pain. Pt states she was seen here last week for same thing. Pt states she went to dialysis and her left arm near her fistula hurts.

## 2020-07-05 NOTE — ED Provider Notes (Signed)
54 yo female history of COPD (not normally on home O2 but states she has low sats at times), ESRD (dialysis M/W/F), additional history in complete chart, with pain in left arm distal to her fistula, exam of arm is unremarkable. Also with abdominal pain, left CVA pain. Had a temp of 100.6 at one point with O2 sat of 87% and BP 95/57, triggered septic work up which has been unremarkable so far. Patient was given Tylenol had symptoms improved.  Scheduled for dialysis tomorrow.  Physical Exam  BP 126/90 (BP Location: Left Leg)   Pulse 80   Temp (!) 100.6 F (38.1 C) (Oral)   Resp 20   Ht 5\' 7"  (1.702 m)   Wt (!) 142.9 kg   LMP 08/03/2008 (Exact Date)   SpO2 97%   BMI 49.34 kg/m   Physical Exam  ED Course/Procedures     Procedures  MDM  CT returns with bibasilar pneumonia. Plan is to consult for admission for pneumonia with new oxygen requirement. CT also shows possible ascending UTI, recommends correlate with UA, patient voids a small amount once daily. Placed on Zithromax and Rocephin for PNA and possible UTI.  Case discussed with Dr. Josephine Cables with [facility] who will consult for admission.        Tacy Learn, PA-C 07/05/20 2213    Davonna Belling, MD 07/05/20 [phone]

## 2020-07-05 NOTE — ED Provider Notes (Addendum)
[facility] EMERGENCY DEPARTMENT Provider Note   CSN: [id number] Arrival date & time: 07/05/20  1101     History Chief Complaint  Patient presents with  . arm pain    Martha [name] is a 54 y.o. female with history of type 2 diabetes, CKD now on dialysis for several years who presents for pain in her left arm at her fistula site and distal to it since Monday. She states she is on dialysis 3 times weekly, Monday, Wednesday, Friday.  Patient was seen here on 12/6 for same issue, without acute abnormality and was sent home.  She was instructed to follow-up with vascular surgery, whom she has not contacted.  She did not receive her dialysis treatment on Monday, but did receive as scheduled, 12/8.  She states that she only completed three quarters of her session due to pain in her left arm, distal to her fistula site.  She states that today her pain is worse in that left arm, and she now is experiencing pain in her abdomen and her back, and reports her legs are hurting severely.  She states "my legs are so numb I am not able to walk on them".  Patient states that she uses a walker at home.  She does endorse history of severe diabetic neuropathy in her lower extremities.  Additionally patient states that she is blind in both eyes.  She states she had her left eye removed approximately 3 years, and became blind in her right eye 2 years ago secondary to uncontrolled diabetes.  Patient is a very poor historian, becomes agitated when I ask her to clarify answers to my questions.  It is difficult to pinpoint why exactly she is presented to the emergency department today. Patient feels strongly that she will need to be admitted to the hospital today, for pain management.  She denies chest pain, shortness of breath, palpitations.  Does endorse abdominal pain, anorexia.  Denies nausea, vomiting, diarrhea.  She states most recent bowel movement was 1 week ago.  She states that she has not had anything to eat  since last Saturday except for Jell-O and a small amount of fluid.  Patient will not elaborate on what that she has been drinking.  She states that she has been "very sick" but describes only lack of appetite since last week.  She denies fevers, chills at home.  I have personally reviewed this patient's medical records.  She has history of hypertension, type 2 diabetes, hyperlipidemia, morbid obesity, ESRD on hemodialysis, bipolar 1 disorder, COPD, CHF.  HPI     Past Medical History:  Diagnosis Date  . Acid reflux    takes Tums  . Anemia   . Arthritis   . Bipolar 1 disorder ([place])   . Blindness of left eye    left eye removed  . Carpal tunnel syndrome, bilateral   . Cervical radiculopathy   . CHF (congestive heart failure) ([place])   . Chronic kidney disease    Stage 5- 01/25/17  . Constipation   . COPD (chronic obstructive pulmonary disease) ([facility]) 2014   bronchitis  . Degenerative disc disease, thoracic   . Depression   . Diabetes mellitus    Type II  . Diabetic retinopathy ([place])   . Dyspnea    when walking  . End stage kidney disease ([place])    M, W, F [facility]  . Gout   . Hypertension   . Noncompliance with medication regimen   . Noncompliance  with medication regimen   . Obesity (BMI 30-39.9)   . OSA (obstructive sleep apnea)    cpap  . Panic attack   . RLS (restless legs syndrome)     Patient Active Problem List   Diagnosis Date Noted  . Trimalleolar fracture of ankle, closed, right, initial encounter 12/17/2018  . Pain and swelling of ankle, right 12/16/2018  . Metabolic encephalopathy [id number]/5449  . SIRS (systemic inflammatory response syndrome) ([place]) 06/23/2018  . UTI (urinary tract infection) 02/26/2018  . Volume overload 02/25/2018  . 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 Dialysis patient ([place]) 12/13/2017  . ESRD (end stage renal disease) on dialysis ([place])   . Chronic diarrhea 10/08/2017  . Anemia in chronic kidney disease 05/24/2017   . Chronic obstructive pulmonary disease ([place]) 05/24/2017  . Heart failure ([place]) 05/24/2017  . 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 Renal failure (ARF), acute on chronic (HCC) 04/22/2017  . Anasarca associated with disorder of kidney 04/20/2017  . Protein-calorie malnutrition, severe 11/29/2016  . HTN (hypertension), malignant 11/20/2016  . Bipolar 1 disorder ([place]) 11/20/2016  . Noncompliance with medication regimen 11/20/2016  . Panic attack 11/20/2016  . Uncontrolled type 2 diabetes mellitus with hyperglycemia, without long-term current use of insulin ([place]) 10/27/2016  . Sensory disturbance 10/27/2016  . Left hemiparesis ([place]) 10/27/2016  . Cholelithiasis 04/26/2014  . Hyponatremia 04/26/2014  . Gallstones 04/06/2014  . Diabetic ulcer of left great toe ([place]) 10/09/2013  . Type 2 diabetes mellitus treated with insulin ([place]) 05/30/2013  . Hyperglycemia 05/30/2013  . Internal hemorrhoids with other complication 20/04/711  . Umbilical hernia without mention of obstruction or gangrene 04/15/2013  . Anemia 04/15/2013  . Morbid obesity with BMI of 50.0-59.9, adult ([place]) 10/08/2012  . DM 05/13/2010  . Hyperlipidemia 05/13/2010  . Morbid obesity ([place]) 05/13/2010  . Depression with anxiety 05/13/2010  . RESTLESS LEG SYNDROME 05/13/2010  . Essential hypertension 05/13/2010  . GERD 05/13/2010  . RECTAL BLEEDING 05/13/2010  . Sleep apnea 05/13/2010  . Dysphagia, oropharyngeal phase 05/13/2010    Past Surgical History:  Procedure Laterality Date  . AV FISTULA PLACEMENT Right 01/27/2017   Procedure: ARTERIOVENOUS (AV) FISTULA CREATION-RIGHT ARM;  Surgeon: Elam Dutch, MD;  [facility];  Service: Vascular;  Laterality: Right;  . AV FISTULA PLACEMENT Left 08/03/2018   Procedure: LEFT ARTERIOVENOUS (AV) FISTULA CREATION;  Surgeon: Elam Dutch, MD;  Location: [place];  Service: Vascular;  Laterality: Left;  . BASCILIC VEIN TRANSPOSITION Left  04/26/2019   Procedure: SECOND STAGE BASILIC VEIN TRANSPOSITION LEFT ARM;  Surgeon: Elam Dutch, MD;  Location: [place];  Service: Vascular;  Laterality: Left;  . BIOPSY N/A 05/17/2013   Procedure: BIOPSY;  Surgeon: [name], MD;  Location: [facility];  Service: Endoscopy;  Laterality: N/A;  . CARPAL TUNNEL RELEASE Left 11/25/2018   Procedure: LEFT CARPAL TUNNEL RELEASE;  Surgeon: Charlotte Crumb, MD;  Location: [place];  Service: Orthopedics;  Laterality: Left;  . CATARACT EXTRACTION Right   . CHOLECYSTECTOMY N/A 04/27/2014   Procedure: LAPAROSCOPIC CHOLECYSTECTOMY;  Surgeon: [name], MD;  Location: [facility];  Service: General;  Laterality: N/A;  . COLONOSCOPY  06/06/2010   RFX:JOITGP bleeding secondary to internal hemorrhoids but incomplete evaluation secondary to poor right colon prep/small rectal and sigmoid colon polyps (hyperplastic). PROPOFOL  . COLONOSCOPY  May 2013   Dr. Gilliam/[facility]: 5 mm a descending colon polyp, hyperplastic. Adequate bowel prep.  . ENDOMETRIAL ABLATION    .  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 ENUCLEATION Left 02/04/2018   Procedure: ENUCLEATION WITH PLACEMENT OF IMPLANT LEFT EYE;  Surgeon: Clista Bernhardt, MD;  Location: [place];  Service: Ophthalmology;  Laterality: Left;  . ESOPHAGOGASTRODUODENOSCOPY  06/06/2010   BHA:LPFXTKW erythema and edema of body of stomach, with sessile polypoid lesions. bx benign. no h.pylori  . ESOPHAGOGASTRODUODENOSCOPY (EGD) WITH PROPOFOL N/A 05/17/2013   Dr. Oneida Alar: normal esophagus, moderate nodular gastritis, negative path, empiric Savary dilation  . EYE SURGERY  07/2017   sx for glaucoma  . FLEXIBLE SIGMOIDOSCOPY N/A 05/17/2013   Dr. Oneida Alar: moderate sized internal hemorrhoids  . HEMORRHOID BANDING N/A 05/17/2013   Procedure: HEMORRHOID BANDING;  Surgeon: [name], MD;  Location: [facility];  Service: Endoscopy;  Laterality: N/A;  2 bands placed  . INCISION AND  DRAINAGE ABSCESS Left 10/11/2013   Procedure: INCISION AND DRAINAGE AND DEBRIDEMENT LEFT BREAST  ABSCESS;  Surgeon: [name], MD;  Location: [facility];  Service: General;  Laterality: Left;  . INSERTION OF DIALYSIS CATHETER N/A 06/08/2018   Procedure: INSERTION OF Right internal jugular TUNNELED  DIALYSIS CATHETER;  Surgeon: Angelia Mould, MD;  Location: [place];  Service: Vascular;  Laterality: N/A;  . IRRIGATION AND DEBRIDEMENT ABSCESS Right 06/01/2013   Procedure: INCISION AND DRAINAGE AND DEBRIDEMENT ABSCESS RIGHT BREAST;  Surgeon: [name], MD;  Location: [facility];  Service: General;  Laterality: Right;  . LEFT EYE REMOVED Left 01/2018   [facility] [address].  Marland Kitchen LIGATION OF ARTERIOVENOUS  FISTULA Right 06/08/2018   Procedure: LIGATION OF ARTERIOVENOUS  FISTULA RIGHT ARM;  Surgeon: Angelia Mould, MD;  Location: [place];  Service: Vascular;  Laterality: Right;  . ORIF ANKLE FRACTURE Right 12/17/2018   Procedure: OPEN REDUCTION INTERNAL FIXATION (ORIF) ANKLE FRACTURE;  Surgeon: Edrick Kins, DPM;  Location: [place];  Service: Podiatry;  Laterality: Right;  . SAVORY DILATION N/A 05/17/2013   Procedure: SAVORY DILATION;  Surgeon: [name], MD;  Location: [facility];  Service: Endoscopy;  Laterality: N/A;  14/15/16  . SKIN FULL THICKNESS GRAFT Left 05/05/2018   Procedure: ABDOMINAL DERMIS FAT SKIN GRAFT FULL THICKNESS LEFT EYE;  Surgeon: Clista Bernhardt, MD;  Location: [place];  Service: Ophthalmology;  Laterality: Left;  . TUBAL LIGATION       OB History    Gravida  2   Para  2   Term  1   Preterm  1   AB      Living  2     SAB      IAB      Ectopic      Multiple      Live Births              Family History  Adopted: Yes  Family history unknown: Yes    Social History   Tobacco Use  . Smoking status: Former Smoker    Packs/day: 1.50    Years: 25.00    Pack years: 37.50    Types: Cigarettes    Quit date:  10/23/2007    Years since quitting: 12.7  . Smokeless tobacco: Never Used  Vaping Use  . Vaping Use: Never used  Substance Use Topics  . Alcohol use: No  . Drug use: No    Home Medications Prior to Admission medications   Medication Sig Start Date End Date Taking? Authorizing Provider  albuterol (PROVENTIL) (2.5 MG/3ML)  0.083% nebulizer solution Take 2.5 mg by nebulization every 6 (six) hours as needed for wheezing or shortness of breath.    [provider]  ALPRAZolam Duanne Moron) 1 MG tablet Take 1 mg by mouth 4 (four) times daily as needed for anxiety.    [provider]  atorvastatin (LIPITOR) 20 MG tablet Take 20 mg by mouth daily.     [provider]  baclofen (LIORESAL) 10 MG tablet Take 10 mg by mouth 2 (two) times daily.    [provider]  bisacodyl (DULCOLAX) 10 MG suppository Place 1 suppository (10 mg total) rectally daily as needed for moderate constipation. 12/03/18   Sinda Du, MD  busPIRone (BUSPAR) 7.5 MG tablet Take 7.5 mg by mouth 2 (two) times daily.    [provider]  calcium carbonate (TUMS EX) 750 MG chewable tablet Chew 2-4 tablets by mouth See admin instructions. Take 4 tablet with each meal & take 2 tablets with each snack    [provider]  cetirizine (ZYRTEC) 10 MG tablet Take 10 mg by mouth daily.    [provider]  cinacalcet (SENSIPAR) 30 MG tablet Take 30 mg by mouth daily.    [provider]  clindamycin (CLEOCIN) 300 MG capsule Take 1 capsule (300 mg total) by mouth 3 (three) times daily. X 7 days 03/06/20   [name], MD  cloNIDine (CATAPRES) 0.1 MG tablet Take 0.1 mg by mouth 2 (two) times daily.    [provider]  colchicine 0.6 MG tablet Take 1 tablet (0.6 mg total) by mouth every Tuesday, Thursday, and Saturday at 6 PM. Take 1 tablet every other day Patient taking differently: Take 0.6 mg by mouth every Tuesday, Thursday, and Saturday at 6 PM.  09/30/18   Johnson,  Clanford L, MD  cyclobenzaprine (FLEXERIL) 10 MG tablet Take 1 tablet (10 mg total) by mouth 2 (two) times daily as needed for muscle spasms. 08/17/19   Milton Ferguson, MD  diphenhydrAMINE (BENADRYL) 25 mg capsule Take 1 capsule (25 mg total) by mouth every 6 (six) hours as needed for itching. 12/03/18   Sinda Du, MD  doxercalciferol (HECTOROL) 4 MCG/2ML injection Inject 0.5 mLs (1 mcg total) into the vein every Monday, Wednesday, and Friday with hemodialysis. 12/03/18   Sinda Du, MD  epoetin alfa (EPOGEN) 10000 UNIT/ML injection Inject 1 mL (10,000 Units total) into the vein 3 (three) times a week. 12/03/18   Sinda Du, MD  gabapentin (NEURONTIN) 300 MG capsule Take 300 mg by mouth at bedtime.    [provider]  HYDROcodone-acetaminophen (NORCO) 10-325 MG tablet Take 1 tablet by mouth every 6 (six) hours as needed (pain.). Prescription needed for SNF admission Patient taking differently: Take 1 tablet by mouth 4 (four) times daily. Prescription needed for SNF admission 12/19/18   Donne Hazel, MD  insulin aspart (NOVOLOG) 100 UNIT/ML injection Inject 0-9 Units into the skin 3 (three) times daily with meals. Patient taking differently: Inject 2-15 Units into the skin 3 (three) times daily with meals. Sliding scale insulin 12/02/18   Sinda Du, MD  Insulin Glargine [facility]) 100 UNIT/ML SOPN Inject 30 Units into the skin at bedtime.     [provider]  lidocaine-prilocaine (EMLA) cream Apply 1 application topically as needed (topical anesthesia for hemodialysis if Gebauers and Lidocaine injection are ineffective.). Patient taking differently: Apply 1 application topically daily as needed (dialysis.).  04/25/17   Johnson, Clanford L, MD  losartan (COZAAR) 25 MG tablet Take  25 mg by mouth daily.    [provider]  omeprazole (PRILOSEC) 40 MG capsule Take 40 mg by mouth daily.    [provider]  ondansetron (ZOFRAN) 4 MG tablet Take 1  tablet (4 mg total) by mouth every 6 (six) hours as needed for nausea. Patient taking differently: Take 4 mg by mouth every 4 (four) hours as needed for nausea.  12/02/18   Sinda Du, MD  PARoxetine (PAXIL) 20 MG tablet Take 1 daily. This is to prevent panic attacks Patient taking differently: Take 20 mg by mouth every evening. This is to prevent panic attacks 10/28/16   Sinda Du, MD  polyethylene glycol (MIRALAX / GLYCOLAX) 17 g packet Take 17 g by mouth daily as needed for mild constipation. 12/02/18   Sinda Du, MD  torsemide (DEMADEX) 100 MG tablet Take 100 mg by mouth 2 (two) times daily.    [provider]    Allergies    Codeine and Tape  Review of Systems   Review of Systems  Constitutional: Positive for appetite change. Negative for activity change, chills, diaphoresis, fatigue, fever and unexpected weight change.  HENT: Negative.   Eyes: Positive for discharge. Negative for pain.       Blindness at baseline in the right eye; patient had left eye removed approximately 3 years ago. Left eye with blood crusted around the rim; patient states that this has persisted since I was removed 3 years ago.  Respiratory: Negative for cough, chest tightness, shortness of breath and wheezing.   Cardiovascular: Negative for chest pain, palpitations and leg swelling.  Gastrointestinal: Positive for abdominal pain and constipation. Negative for abdominal distention, nausea and vomiting.  Endocrine: Negative.   Genitourinary: Negative for dysuria.       Patient states she urinates only a small amount every day  Musculoskeletal: Positive for back pain.  Skin: Negative.   Allergic/Immunologic: Positive for immunocompromised state.       Poorly controlled diabetes mellitus type 2  Neurological: Positive for numbness. Negative for dizziness, weakness, light-headedness and headaches.    Physical Exam Updated Vital Signs BP 126/90 (BP Location: Left Leg)   Pulse 80   Temp (!)  100.6 F (38.1 C) (Oral)   Resp 20   Ht 5\' 7"  (1.702 m)   Wt (!) 142.9 kg   LMP 08/03/2008 (Exact Date)   SpO2 97%   BMI 49.34 kg/m   Physical Exam Vitals and nursing note reviewed.  Constitutional:      Appearance: She is obese. She is not ill-appearing.  HENT:     Head: Normocephalic and atraumatic.     Nose: Nose normal.     Mouth/Throat:     Mouth: Mucous membranes are moist.     Dentition: Normal dentition. No dental tenderness.     Pharynx: Oropharynx is clear. Uvula midline. No oropharyngeal exudate or posterior oropharyngeal erythema.     Tonsils: No tonsillar exudate.  Eyes:     General:        Right eye: No discharge.        Left eye: No discharge.     Conjunctiva/sclera: Conjunctivae normal.  Cardiovascular:     Rate and Rhythm: Normal rate and regular rhythm.     Pulses:          Radial pulses are 1+ on the right side and 1+ on the left side.     Heart sounds: Normal heart sounds. No murmur heard.   Pulmonary:  Effort: Pulmonary effort is normal. No respiratory distress.     Breath sounds: Decreased air movement present. No wheezing or rales.     Comments: Patient was found to be hypoxic in the waiting room to 88% on room air, was placed on 2 L by nasal cannula with oxygen saturation improved Chest:     Chest wall: No mass, deformity, swelling, tenderness, crepitus or edema.  Abdominal:     General: Bowel sounds are normal. There is no distension.     Tenderness: There is generalized abdominal tenderness. There is right CVA tenderness. There is no left CVA tenderness.  Musculoskeletal:        General: No swelling, tenderness or deformity.       Arms:     Cervical back: Neck supple. No rigidity or tenderness.     Right lower leg: No edema.     Left lower leg: No edema.  Lymphadenopathy:     Cervical: No cervical adenopathy.  Skin:    General: Skin is warm and dry.     Capillary Refill: Capillary refill takes less than 2 seconds.  Neurological:      General: No focal deficit present.     Mental Status: She is alert and oriented to person, place, and time. Mental status is at baseline.     Sensory: Sensation is intact.     Motor: Motor function is intact.  Psychiatric:        Mood and Affect: Mood normal.     ED Results / Procedures / Treatments   Labs (all labs ordered are listed, but only abnormal results are displayed) Labs Reviewed  CBC WITH DIFFERENTIAL/PLATELET - Abnormal; Notable for the following components:      Result Value   WBC 11.9 (*)    RBC 3.76 (*)    Hemoglobin 11.1 (*)    RDW 17.4 (*)    Neutro Abs 9.7 (*)    Abs Immature Granulocytes 0.15 (*)    All other components within normal limits  COMPREHENSIVE METABOLIC PANEL - Abnormal; Notable for the following components:   Sodium 132 (*)    Chloride 91 (*)    Glucose, Bld 158 (*)    BUN 57 (*)    Creatinine, Ser 12.35 (*)    Calcium 8.3 (*)    Albumin 3.3 (*)    Alkaline Phosphatase 146 (*)    Total Bilirubin 1.6 (*)    GFR, Estimated 3 (*)    Anion gap 19 (*)    All other components within normal limits  PROTIME-INR - Abnormal; Notable for the following components:   Prothrombin Time 16.0 (*)    INR 1.3 (*)    All other components within normal limits  CULTURE, BLOOD (SINGLE)  URINE CULTURE  RESP PANEL BY RT-PCR (FLU A&B, COVID) ARPGX2  CULTURE, BLOOD (SINGLE)  LIPASE, BLOOD  LACTIC ACID, PLASMA  LACTIC ACID, PLASMA  APTT  URINALYSIS, ROUTINE W REFLEX MICROSCOPIC  POC URINE PREG, ED    EKG None  Radiology DG Chest Port 1 View  Result Date: 07/05/2020 CLINICAL DATA:  Questionable sepsis - evaluate for abnormality EXAM: PORTABLE CHEST 1 VIEW COMPARISON:  07/02/2020 and prior FINDINGS: No focal consolidation, pneumothorax or pleural effusion. Mild cardiomegaly, unchanged. No acute osseous abnormality. IMPRESSION: No focal airspace disease. Unchanged mild cardiomegaly. Electronically Signed   By: Primitivo Gauze M.D.   On: 07/05/2020 19:02     Procedures Procedures (including critical care time)  Medications Ordered in ED Medications  cefTRIAXone (ROCEPHIN) 1 g in sodium chloride 0.9 % 100 mL IVPB (has no administration in time range)  azithromycin (ZITHROMAX) tablet 500 mg (has no administration in time range)  acetaminophen (TYLENOL) tablet 650 mg (650 mg Oral Given 07/05/20 1811)  iohexol (OMNIPAQUE) 300 MG/ML solution 100 mL (100 mLs Intravenous Contrast Given 07/05/20 2039)    ED Course  I have reviewed the triage vital signs and the nursing notes.  Pertinent labs & imaging results that were available during my care of the patient were reviewed by me and considered in my medical decision making (see chart for details).    MDM Rules/Calculators/A&P                         54 year old female who presents with multiple concerns, and primary with chest pain distal to her fistula site in her left AC.  Patient was found to be hypoxic in the waiting room to 80%.  This happened with prior visits to the emergency department; she states that she has COPD and feels she should be on oxygen at home, but is not.  At time of my initial exam she is saturating 94% on 2 L of oxygen by nasal cannula.   Cardiopulmonary exam is unremarkable, abdominal exam significant for diffuse abdominal tenderness to palpation as well as left CVA tenderness.  Her vital signs were taken again on this patient immediately following my exam.  Patient was febrile at that time to 100.6 F, tachycardic to 105, and hypotensive to 95/57.  Additionally she was hypoxic to 87% on 2 L by nasal cannula. I presented to the room and the patient was complaining of feeling that she could not breathe; she was transferred to a stretcher by this provider and to the nurses, and was placed on nonrebreather mask with 4 L supplemental oxygen; improvement of saturations to 94%.  BP improved to 951 systolic that time as well. Tylenol administered.  Given new onset fever, reported  tachycardia and hypoxia patient with ESRD and diabetes mellitus, will proceed with code sepsis order set at this time.  CBC with leukocytosis of 11.9, previously 7.4.   CMP with creatinine of 12.35, patient's baseline.  Bilirubin elevated to 1.6, previously 1.4.  Anion gap 19, previously 18.   PT/INR elevated to 16/1.3.  Chest x-ray with unchanged cardiomegaly, no focal airspace disease.  Lipase negative, 23.    Lactic acid negative, 1.6. Repeat also negative, 0.9.   Patient was reevaluated, states she is feeling much better after administration of Tylenol and lying flat in the hospital bed.  She reiterates discomfort in her left arm,  CT exam pending at the time of shift change.  Physical exam and work-up up to this point were thoroughly discussed with ED provider to assume this patient's care, Suella Broad, PA-C.  Disposition plan to be finalized after CT results, however suspect this patient will require admission to the hospital.  I appreciate Laura's collaboration care of this patient. Martha [name] voiced understanding of her medical evaluation and treatment plan.  Her questions were answered to her expressed satisfaction.  She voiced understanding of likely disposition plan to be admitted to the hospital, pending CT results.  Final Clinical Impression(s) / ED Diagnoses Final diagnoses:  Community acquired pneumonia, unspecified laterality  Hypoxemia    Rx / DC Orders ED Discharge Orders    None       Debbrah Sampedro, Gypsy Balsam, PA-C 07/05/20 2128    Alishba [place], Gypsy Balsam,  PA-C 07/05/20 2130    Davonna Belling, MD 07/05/20 2315

## 2020-07-05 NOTE — H&P (Signed)
History and Physical  JULIANNE CHAMBERLIN KWI:[id number] DOB: 07-16-1966 DOA: 07/05/2020  Referring physician: Tacy Learn, PA-C PCP: Emelia Loron, NP  Patient coming from: Home  Chief Complaint: Arm pain  HPI: Martha [name] is a 54 y.o. female with medical history significant for hypertension, ESRD on HD (MWF), T2 DM, degenerative disc disease, COPD, CHF, carpal tunnel syndrome, legally blind, bipolar, arthritis, anemia presents to the ED with complaint of left arm which has been ongoing since Monday she presents to [facility] due to same complaint on 12/6 last dialysis was on Wednesday 12/8 during which she only completed three fourths of her session due to left arm pain.  She also complained of back pain and leg pain.  Patient also complained of increased shortness of breath that has been ongoing for several days, she denies cough, fever, chest pain or any known sick contacts.  ED Course:  In the emergency department, she was febrile and was noted to be hypoxic with an O2 sat of 87% on room air, supplemental oxygen via [place] at 2 LPM was provided with improvement in O2 sat to 97%.  Work-up in the ED showed leukocytosis, hyponatremia, BUN to creatinine 15/0.35, hyperglycemia, hypoalbuminemia, ALP 146. CT abdomen and pelvis with contrast showed new bibasilar airspace disease concerning for pneumonia and mild left-sided collecting system dilatation with asymmetric fat stranding about the left ureter with concern for ascending urinary tract infection (patient denies any irritative bladder symptoms). She was empirically started on IV antibiotics (ceftriaxone and azithromycin, Tylenol 650 mg x 1 was given.  Hospitalist was asked to admit patient for further evaluation and management.  Review of Systems: Constitutional: Negative for chills and fever.  HENT: Negative for ear pain and sore throat.   Eyes: Positive for blindness.  Negative for pain.  Respiratory: Negative for cough, chest tightness and  shortness of breath.   Cardiovascular: Negative for chest pain and palpitations.  Gastrointestinal: Negative for abdominal pain and vomiting.  Endocrine: Negative for polyphagia and polyuria.  Genitourinary: Negative for decreased urine volume, dysuria, enuresis Musculoskeletal: Positive for back pain and numbness in legs Skin: Negative for color change and rash.   Allergic/Immunologic: Negative for immunocompromised state.  Neurological: Negative for tremors, syncope, speech difficulty, weakness, light-headedness and headaches.  Hematological: Does not bruise/bleed easily.  All other systems reviewed and are negative   Past Medical History:  Diagnosis Date  . Acid reflux    takes Tums  . Anemia   . Arthritis   . Bipolar 1 disorder ([place])   . Blindness of left eye    left eye removed  . Carpal tunnel syndrome, bilateral   . Cervical radiculopathy   . CHF (congestive heart failure) ([place])   . Chronic kidney disease    Stage 5- 01/25/17  . Constipation   . COPD (chronic obstructive pulmonary disease) ([place]) 2014   bronchitis  . Degenerative disc disease, thoracic   . Depression   . Diabetes mellitus    Type II  . Diabetic retinopathy ([place])   . Dyspnea    when walking  . End stage kidney disease ([place])    M, W, F [facility] [place]  . Gout   . Hypertension   . Noncompliance with medication regimen   . Noncompliance with medication regimen   . Obesity (BMI 30-39.9)   . OSA (obstructive sleep apnea)    cpap  . Panic attack   . RLS (restless legs syndrome)    Past Surgical History:  Procedure  Laterality Date  . AV FISTULA PLACEMENT Right 01/27/2017   Procedure: ARTERIOVENOUS (AV) FISTULA CREATION-RIGHT ARM;  Surgeon: Elam Dutch, MD;  Location: [facility] OR;  Service: Vascular;  Laterality: Right;  . AV FISTULA PLACEMENT Left 08/03/2018   Procedure: LEFT ARTERIOVENOUS (AV) FISTULA CREATION;  Surgeon: Elam Dutch, MD;  Location: [place];  Service: Vascular;  Laterality: Left;   . BASCILIC VEIN TRANSPOSITION Left 04/26/2019   Procedure: SECOND STAGE BASILIC VEIN TRANSPOSITION LEFT ARM;  Surgeon: Elam Dutch, MD;  Location: [place];  Service: Vascular;  Laterality: Left;  . BIOPSY N/A 05/17/2013   Procedure: BIOPSY;  Surgeon: [name], MD;  Location: [facility];  Service: Endoscopy;  Laterality: N/A;  . CARPAL TUNNEL RELEASE Left 11/25/2018   Procedure: LEFT CARPAL TUNNEL RELEASE;  Surgeon: Charlotte Crumb, MD;  Location: [place];  Service: Orthopedics;  Laterality: Left;  . CATARACT EXTRACTION Right   . CHOLECYSTECTOMY N/A 04/27/2014   Procedure: LAPAROSCOPIC CHOLECYSTECTOMY;  Surgeon: [name], MD;  Location: [facility];  Service: General;  Laterality: N/A;  . COLONOSCOPY  06/06/2010   POE:UMPNTI bleeding secondary to internal hemorrhoids but incomplete evaluation secondary to poor right colon prep/small rectal and sigmoid colon polyps (hyperplastic). PROPOFOL  . COLONOSCOPY  May 2013   Dr. Gilliam/[facility]: 5 mm a descending colon polyp, hyperplastic. Adequate bowel prep.  . ENDOMETRIAL ABLATION    . 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 ENUCLEATION Left 02/04/2018   Procedure: ENUCLEATION WITH PLACEMENT OF IMPLANT LEFT EYE;  Surgeon: Clista Bernhardt, MD;  Location: [place];  Service: Ophthalmology;  Laterality: Left;  . ESOPHAGOGASTRODUODENOSCOPY  06/06/2010   RWE:RXVQMGQ erythema and edema of body of stomach, with sessile polypoid lesions. bx benign. no h.pylori  . ESOPHAGOGASTRODUODENOSCOPY (EGD) WITH PROPOFOL N/A 05/17/2013   Dr. Oneida Alar: normal esophagus, moderate nodular gastritis, negative path, empiric Savary dilation  . EYE SURGERY  07/2017   sx for glaucoma  . FLEXIBLE SIGMOIDOSCOPY N/A 05/17/2013   Dr. Oneida Alar: moderate sized internal hemorrhoids  . HEMORRHOID BANDING N/A 05/17/2013   Procedure: HEMORRHOID BANDING;  Surgeon: [name], MD;  [facility];  Service: Endoscopy;  Laterality: N/A;  2  bands placed  . INCISION AND DRAINAGE ABSCESS Left 10/11/2013   Procedure: INCISION AND DRAINAGE AND DEBRIDEMENT LEFT BREAST  ABSCESS;  Surgeon: [name], MD;  [facility];  Service: General;  Laterality: Left;  . INSERTION OF DIALYSIS CATHETER N/A 06/08/2018   Procedure: INSERTION OF Right internal jugular TUNNELED  DIALYSIS CATHETER;  Surgeon: Angelia Mould, MD;  Location: [place];  Service: Vascular;  Laterality: N/A;  . IRRIGATION AND DEBRIDEMENT ABSCESS Right 06/01/2013   Procedure: INCISION AND DRAINAGE AND DEBRIDEMENT ABSCESS RIGHT BREAST;  Surgeon: [name], MD;  [facility];  Service: General;  Laterality: Right;  . LEFT EYE REMOVED Left 01/2018   [facility] [address].  Marland Kitchen LIGATION OF ARTERIOVENOUS  FISTULA Right 06/08/2018   Procedure: LIGATION OF ARTERIOVENOUS  FISTULA RIGHT ARM;  Surgeon: Angelia Mould, MD;  Location: [place];  Service: Vascular;  Laterality: Right;  . ORIF ANKLE FRACTURE Right 12/17/2018   Procedure: OPEN REDUCTION INTERNAL FIXATION (ORIF) ANKLE FRACTURE;  Surgeon: Edrick Kins, DPM;  Location: [place];  Service: Podiatry;  Laterality: Right;  . SAVORY DILATION N/A 05/17/2013   Procedure: SAVORY DILATION;  Surgeon: [name], MD;  [facility];  Service: Endoscopy;  Laterality: N/A;  14/15/16  . SKIN FULL THICKNESS GRAFT Left 05/05/2018   Procedure: ABDOMINAL DERMIS FAT SKIN GRAFT FULL THICKNESS LEFT EYE;  Surgeon: Clista Bernhardt, MD;  Location: [place];  Service: Ophthalmology;  Laterality: Left;  . TUBAL LIGATION      Social History:  reports that she quit smoking about 12 years ago. Her smoking use included cigarettes. She has a 37.50 pack-year smoking history. She has never used smokeless tobacco. She reports that she does not drink alcohol and does not use drugs.   Allergies  Allergen Reactions  . Codeine Nausea And Vomiting  . Tape Rash and Other (See Comments)    Pull skin off.  Paper  tape is ok    Family History  Adopted: Yes  Family history unknown: Yes     Prior to Admission medications   Medication Sig Start Date End Date Taking? Authorizing Provider  albuterol (PROVENTIL) (2.5 MG/3ML) 0.083% nebulizer solution Take 2.5 mg by nebulization every 6 (six) hours as needed for wheezing or shortness of breath.    [provider]  ALPRAZolam Duanne Moron) 1 MG tablet Take 1 mg by mouth 4 (four) times daily as needed for anxiety.    [provider]  atorvastatin (LIPITOR) 20 MG tablet Take 20 mg by mouth daily.     [provider]  baclofen (LIORESAL) 10 MG tablet Take 10 mg by mouth 2 (two) times daily.    [provider]  bisacodyl (DULCOLAX) 10 MG suppository Place 1 suppository (10 mg total) rectally daily as needed for moderate constipation. 12/03/18   Sinda Du, MD  busPIRone (BUSPAR) 7.5 MG tablet Take 7.5 mg by mouth 2 (two) times daily.    [provider]  calcium carbonate (TUMS EX) 750 MG chewable tablet Chew 2-4 tablets by mouth See admin instructions. Take 4 tablet with each meal & take 2 tablets with each snack    [provider]  cetirizine (ZYRTEC) 10 MG tablet Take 10 mg by mouth daily.    [provider]  cinacalcet (SENSIPAR) 30 MG tablet Take 30 mg by mouth daily.    [provider]  clindamycin (CLEOCIN) 300 MG capsule Take 1 capsule (300 mg total) by mouth 3 (three) times daily. X 7 days 03/06/20   [name], MD  cloNIDine (CATAPRES) 0.1 MG tablet Take 0.1 mg by mouth 2 (two) times daily.    [provider]  colchicine 0.6 MG tablet Take 1 tablet (0.6 mg total) by mouth every Tuesday, Thursday, and Saturday at 6 PM. Take 1 tablet every other day Patient taking differently: Take 0.6 mg by mouth every Tuesday, Thursday, and Saturday at 6 PM.  09/30/18   Johnson, Clanford L, MD  cyclobenzaprine (FLEXERIL) 10 MG tablet Take 1 tablet (10 mg total) by mouth 2 (two) times daily as  needed for muscle spasms. 08/17/19   Milton Ferguson, MD  diphenhydrAMINE (BENADRYL) 25 mg capsule Take 1 capsule (25 mg total) by mouth every 6 (six) hours as needed for itching. 12/03/18   Sinda Du, MD  doxercalciferol (HECTOROL) 4 MCG/2ML injection Inject 0.5 mLs (1 mcg total) into the vein every Monday, Wednesday, and Friday with hemodialysis. 12/03/18   Sinda Du, MD  epoetin alfa (EPOGEN) 10000 UNIT/ML injection Inject 1 mL (10,000 Units total) into the vein 3 (three) times a week. 12/03/18   Sinda Du, MD  gabapentin (NEURONTIN) 300 MG capsule Take 300 mg by mouth at bedtime.    [provider]  HYDROcodone-acetaminophen (NORCO) 10-325 MG tablet Take 1 tablet by mouth every 6 (six) hours as needed (pain.). Prescription needed for SNF admission Patient taking differently: Take 1 tablet by mouth 4 (four) times daily. Prescription needed for SNF admission 12/19/18   Donne Hazel, MD  insulin aspart (NOVOLOG) 100 UNIT/ML injection Inject 0-9 Units into the skin 3 (three) times daily with meals. Patient taking differently: Inject 2-15 Units into the skin 3 (three) times daily with meals. Sliding scale insulin 12/02/18   Sinda Du, MD  Insulin Glargine [facility]) 100 UNIT/ML SOPN Inject 30 Units into the skin at bedtime.     [provider]  lidocaine-prilocaine (EMLA) cream Apply 1 application topically as needed (topical anesthesia for hemodialysis if Gebauers and Lidocaine injection are ineffective.). Patient taking differently: Apply 1 application topically daily as needed (dialysis.).  04/25/17   Johnson, Clanford L, MD  losartan (COZAAR) 25 MG tablet Take 25 mg by mouth daily.    [provider]  omeprazole (PRILOSEC) 40 MG capsule Take 40 mg by mouth daily.    [provider]  ondansetron (ZOFRAN) 4 MG tablet Take 1 tablet (4 mg total) by mouth every 6 (six) hours as needed for nausea. Patient taking differently: Take 4 mg by mouth  every 4 (four) hours as needed for nausea.  12/02/18   Sinda Du, MD  PARoxetine (PAXIL) 20 MG tablet Take 1 daily. This is to prevent panic attacks Patient taking differently: Take 20 mg by mouth every evening. This is to prevent panic attacks 10/28/16   Sinda Du, MD  polyethylene glycol (MIRALAX / GLYCOLAX) 17 g packet Take 17 g by mouth daily as needed for mild constipation. 12/02/18   Sinda Du, MD  torsemide (DEMADEX) 100 MG tablet Take 100 mg by mouth 2 (two) times daily.    [provider]    Physical Exam: BP (!) 106/58   Pulse 83   Temp (!) 100.6 F (38.1 C) (Oral)   Resp 20   Ht 5\' 7"  (1.702 m)   Wt (!) 142.9 kg   LMP 08/03/2008 (Exact Date)   SpO2 100%   BMI 49.34 kg/m   . General: 54 y.o. year-old female obese in no acute distress.  Alert and oriented x3. Marland Kitchen HEENT: NCAT, EOMI . Neck: Supple, trachea medial . Cardiovascular: Regular rate and rhythm with no rubs or gallops.  No thyromegaly or JVD noted.  No lower extremity edema. 2/4 pulses in all 4 extremities. Marland Kitchen Respiratory: Decreased breath sounds bilaterally. No wheezes or rales.  . Abdomen: Soft nontender nondistended with normal bowel sounds x4 quadrants. . Muskuloskeletal: No cyanosis, clubbing or edema noted bilaterally . Neuro: CN II-XII intact, decreased sensation in feet bilaterally.  Strength, reflexes . Skin: No ulcerative lesions noted or rashes . Psychiatry: Judgement and insight appear normal. Mood is appropriate for condition and setting          Labs on Admission:  Basic Metabolic Panel: Recent Labs  Lab 07/02/20 1021 07/05/20 1813  NA 133* 132*  K 4.5 4.6  CL 94* 91*  CO2 21* 22  GLUCOSE 168* 158*  BUN 65* 57*  CREATININE 12.85* 12.35*  CALCIUM 8.5* 8.3*   Liver Function Tests: Recent Labs  Lab 07/05/20 1813  AST 29  ALT 38  ALKPHOS 146*  BILITOT 1.6*  PROT 8.1  ALBUMIN 3.3*   Recent Labs  Lab 07/05/20 1813  LIPASE 23   No results for input(s): AMMONIA  in the  last 168 hours. CBC: Recent Labs  Lab 07/02/20 1241 07/05/20 1813  WBC 7.4 11.9*  NEUTROABS 5.2 9.7*  HGB 11.1* 11.1*  HCT 35.2* 36.4  MCV 95.7 96.8  PLT 108* 225   Cardiac Enzymes: No results for input(s): CKTOTAL, CKMB, CKMBINDEX, TROPONINI in the last 168 hours.  BNP (last 3 results) No results for input(s): BNP in the last 8760 hours.  ProBNP (last 3 results) No results for input(s): PROBNP in the last 8760 hours.  CBG: No results for input(s): GLUCAP in the last 168 hours.  Radiological Exams on Admission: CT Abdomen Pelvis W Contrast  Result Date: 07/05/2020 CLINICAL DATA:  Abdominal pain. EXAM: CT ABDOMEN AND PELVIS WITH CONTRAST TECHNIQUE: Multidetector CT imaging of the abdomen and pelvis was performed using the standard protocol following bolus administration of intravenous contrast. CONTRAST:  170mL OMNIPAQUE IOHEXOL 300 MG/ML  SOLN COMPARISON:  CT dated July 04, 2020 FINDINGS: Lower chest: There is new bibasilar airspace disease concerning for pneumonia.The heart is enlarged. Hepatobiliary: The liver is normal. Status post cholecystectomy.There is no biliary ductal dilation. Pancreas: Normal contours without ductal dilatation. No peripancreatic fluid collection. Spleen: The spleen is enlarged. 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 Adrenals/Urinary Tract: --Adrenal glands: Unremarkable. --Right kidney/ureter: The right kidney is atrophic without evidence for hydronephrosis. --Left kidney/ureter: The left kidney is atrophic. There is mild left-sided collecting system dilatation with asymmetric fat stranding about the left ureter. --Urinary bladder: Unremarkable. Stomach/Bowel: --Stomach/Duodenum: No hiatal hernia or other gastric abnormality. Normal duodenal course and caliber. --Small bowel: Unremarkable. --Colon: Unremarkable. --Appendix: Normal. Vascular/Lymphatic: Atherosclerotic calcification is present  within the non-aneurysmal abdominal aorta, without hemodynamically significant stenosis. --No retroperitoneal lymphadenopathy. --No mesenteric lymphadenopathy. --No pelvic or inguinal lymphadenopathy. Reproductive: Unremarkable Other: No ascites or free air. The abdominal wall is normal. Musculoskeletal. No acute displaced fractures. IMPRESSION: 1. New bibasilar airspace disease concerning for pneumonia. 2. Mild left-sided collecting system dilatation with asymmetric fat stranding about the left ureter. Correlation with urinalysis is recommended as findings may represent an ascending urinary tract infection. 3. 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 Aortic Atherosclerosis (ICD10-I70.0). Electronically Signed   By: Constance Holster M.D.   On: 07/05/2020 21:15   DG Chest Port 1 View  Result Date: 07/05/2020 CLINICAL DATA:  Questionable sepsis - evaluate for abnormality EXAM: PORTABLE CHEST 1 VIEW COMPARISON:  07/02/2020 and prior FINDINGS: No focal consolidation, pneumothorax or pleural effusion. Mild cardiomegaly, unchanged. No acute osseous abnormality. IMPRESSION: No focal airspace disease. Unchanged mild cardiomegaly. Electronically Signed   By: Primitivo Gauze M.D.   On: 07/05/2020 19:02    EKG: I independently viewed the EKG done and my findings are as followed: Normal sinus rhythm at rate of 80 bpm  Assessment/Plan Present on Admission: . CAP (community acquired pneumonia) . UTI (urinary tract infection) . Essential hypertension . Sleep apnea . Morbid obesity ([place]) . GERD . Bipolar 1 disorder ([place])  Principal Problem:   CAP (community acquired pneumonia) Active Problems:   Morbid obesity ([place])   Essential hypertension   GERD   Sleep apnea   Type 2 diabetes mellitus treated with insulin (H[facility])   Hyperglycemia due to diabetes mellitus ([place])   Bipolar 1 disorder ([facility])   ESRD (end stage renal disease) on dialysis ([place])    UTI (urinary tract infection)   Hypoalbuminemia  Acute respiratory failure with hypoxia secondary to CAP POA CT abdomen and pelvis with contrast showed  new bibasilar airspace disease concerning for pneumonia  Patient was febrile and hypoxic on room air PORT/PSI of 72 points  indicating 0.9-2.8% mortality Continue Mucinex, ceftriaxone and azithromycin Continue  incentive spirometry, flutter valve  Blood culture, urine Legionella, strep pneumo and sputum culture pending  Presumed UTI POA CT abdomen pelvis was suggestive of ascending urinary tract infection.   Continue with ceftriaxone as indicated above  Hyperglycemia secondary to type 2 diabetes mellitus Continue insulin sliding scale and hypoglycemic protocol  Hypoalbuminemia possibly secondary to mild protein calorie malnutrition Protein supplement will be provided  ESRD on HD (MWF) Nephrology will be consulted for maintenance dialysis  Obstructive sleep apnea on CPAP at night Continue CPAP  Essential hypertension Hold BP meds at this time due to soft blood pressure  Hyperlipidemia Continue Lipitor  GERD Continue Protonix  Bipolar 1 disorder Continue Paxil  Diabetic neuropathy Continue gabapentin,  Flexeril  Morbid obesity (BMI 49.34) Patient will be counseled on diet and lifestyle modification when more stable   DVT prophylaxis: Heparin subcu  Code Status: Full code  Family Communication: None at bedside  Disposition Plan:  Patient is from:                        home Anticipated DC to:                   home Anticipated DC date:               2-3 days Anticipated DC barriers:           Patient is unstable to be discharged at this time due to CAP and presumed UTI POA requiring inpatient antibiotic treatment   Consults called: None  Admission status: Inpatient    Bernadette Hoit MD [facility]  07/06/2020, 12:58 AM

## 2020-07-06 ENCOUNTER — Inpatient Hospital Stay (HOSPITAL_COMMUNITY): Payer: Medicaid Other

## 2020-07-06 DIAGNOSIS — E8809 Other disorders of plasma-protein metabolism, not elsewhere classified: Secondary | ICD-10-CM

## 2020-07-06 DIAGNOSIS — J9601 Acute respiratory failure with hypoxia: Secondary | ICD-10-CM

## 2020-07-06 LAB — PHOSPHORUS: Phosphorus: 6.5 mg/dL — ABNORMAL HIGH (ref 2.5–4.6)

## 2020-07-06 LAB — CBC
HCT: 35.2 % — ABNORMAL LOW (ref 36.0–46.0)
Hemoglobin: 10.7 g/dL — ABNORMAL LOW (ref 12.0–15.0)
MCH: 29.2 pg (ref 26.0–34.0)
MCHC: 30.4 g/dL (ref 30.0–36.0)
MCV: 95.9 fL (ref 80.0–100.0)
Platelets: 234 10*3/uL (ref 150–400)
RBC: 3.67 MIL/uL — ABNORMAL LOW (ref 3.87–5.11)
RDW: 17.6 % — ABNORMAL HIGH (ref 11.5–15.5)
WBC: 11.2 10*3/uL — ABNORMAL HIGH (ref 4.0–10.5)
nRBC: 0 % (ref 0.0–0.2)

## 2020-07-06 LAB — COMPREHENSIVE METABOLIC PANEL
ALT: 26 U/L (ref 0–44)
AST: 16 U/L (ref 15–41)
Albumin: 3 g/dL — ABNORMAL LOW (ref 3.5–5.0)
Alkaline Phosphatase: 130 U/L — ABNORMAL HIGH (ref 38–126)
Anion gap: 19 — ABNORMAL HIGH (ref 5–15)
BUN: 68 mg/dL — ABNORMAL HIGH (ref 6–20)
CO2: 23 mmol/L (ref 22–32)
Calcium: 8.8 mg/dL — ABNORMAL LOW (ref 8.9–10.3)
Chloride: 92 mmol/L — ABNORMAL LOW (ref 98–111)
Creatinine, Ser: 13.55 mg/dL — ABNORMAL HIGH (ref 0.44–1.00)
GFR, Estimated: 3 mL/min — ABNORMAL LOW (ref >60)
Glucose, Bld: 130 mg/dL — ABNORMAL HIGH (ref 70–99)
Potassium: 4.6 mmol/L (ref 3.5–5.1)
Sodium: 134 mmol/L — ABNORMAL LOW (ref 135–145)
Total Bilirubin: 0.8 mg/dL (ref 0.3–1.2)
Total Protein: 7.7 g/dL (ref 6.5–8.1)

## 2020-07-06 LAB — GLUCOSE, CAPILLARY
Glucose-Capillary: 152 mg/dL — ABNORMAL HIGH (ref 70–99)
Glucose-Capillary: 147 mg/dL — ABNORMAL HIGH (ref 70–99)
Glucose-Capillary: 201 mg/dL — ABNORMAL HIGH (ref 70–99)

## 2020-07-06 LAB — PROTIME-INR
INR: 1.3 — ABNORMAL HIGH (ref 0.8–1.2)
Prothrombin Time: 15.6 seconds — ABNORMAL HIGH (ref 11.4–15.2)

## 2020-07-06 LAB — MAGNESIUM: Magnesium: 1.9 mg/dL (ref 1.7–2.4)

## 2020-07-06 LAB — BLOOD CULTURE ID PANEL (REFLEXED) - BCID2

## 2020-07-06 LAB — RESP PANEL BY RT-PCR (FLU A&B, COVID) ARPGX2
Influenza A by PCR: NEGATIVE
Influenza B by PCR: NEGATIVE
SARS Coronavirus 2 by RT PCR: NEGATIVE

## 2020-07-06 LAB — APTT: aPTT: 35 seconds (ref 24–36)

## 2020-07-06 LAB — MRSA PCR SCREENING: MRSA by PCR: POSITIVE — AB

## 2020-07-06 LAB — CBG MONITORING, ED: Glucose-Capillary: 136 mg/dL — ABNORMAL HIGH (ref 70–99)

## 2020-07-06 LAB — HEMOGLOBIN A1C
Hgb A1c MFr Bld: 6 % — ABNORMAL HIGH (ref 4.8–5.6)
Mean Plasma Glucose: 125.5 mg/dL

## 2020-07-06 MED ORDER — SODIUM CHLORIDE 0.9 % IV SOLN: 100.0000 mL | INTRAVENOUS | Status: DC | PRN

## 2020-07-06 MED ORDER — VANCOMYCIN HCL 2000 MG/400ML IV SOLN
2000.0000 mg | Freq: Once | INTRAVENOUS | Status: AC
  Administered 2020-07-06: 2000 mg via INTRAVENOUS
  Filled 2020-07-06 (×2): qty 400

## 2020-07-06 MED ORDER — OXYCODONE HCL 5 MG PO TABS
5.0000 mg | ORAL_TABLET | ORAL | Status: DC | PRN
Start: 2020-07-06 — End: 2020-07-12
  Administered 2020-07-06 – 2020-07-12 (×6): 5 mg via ORAL
  Filled 2020-07-06 (×8): qty 1

## 2020-07-06 MED ORDER — CHLORHEXIDINE GLUCONATE CLOTH 2 % EX PADS
6.0000 | MEDICATED_PAD | Freq: Every day | CUTANEOUS | Status: AC
  Administered 2020-07-07 – 2020-07-11 (×4): 6 via TOPICAL

## 2020-07-06 MED ORDER — IOHEXOL 300 MG/ML  SOLN
75.0000 mL | Freq: Once | INTRAMUSCULAR | Status: AC | PRN
  Administered 2020-07-07: 75 mL via INTRAVENOUS

## 2020-07-06 MED ORDER — VANCOMYCIN HCL IN DEXTROSE 1-5 GM/200ML-% IV SOLN
1000.0000 mg | INTRAVENOUS | Status: DC
  Administered 2020-07-09 – 2020-07-11 (×2): 1000 mg via INTRAVENOUS
  Filled 2020-07-06 (×2): qty 200

## 2020-07-06 MED ORDER — LIDOCAINE-PRILOCAINE 2.5-2.5 % EX CREA: 1.0000 "application " | TOPICAL_CREAM | CUTANEOUS | Status: DC | PRN

## 2020-07-06 MED ORDER — GLUCERNA SHAKE PO LIQD
237.0000 mL | Freq: Three times a day (TID) | ORAL | Status: DC
  Administered 2020-07-06 – 2020-07-12 (×8): 237 mL via ORAL
  Filled 2020-07-06 (×11): qty 237

## 2020-07-06 MED ORDER — INSULIN ASPART 100 UNIT/ML ~~LOC~~ SOLN
0.0000 [IU] | Freq: Every day | SUBCUTANEOUS | Status: DC
  Administered 2020-07-06 – 2020-07-10 (×2): 2 [IU] via SUBCUTANEOUS

## 2020-07-06 MED ORDER — PANTOPRAZOLE SODIUM 40 MG PO TBEC
40.0000 mg | DELAYED_RELEASE_TABLET | Freq: Every day | ORAL | Status: DC
  Administered 2020-07-06 – 2020-07-12 (×7): 40 mg via ORAL
  Filled 2020-07-06 (×7): qty 1

## 2020-07-06 MED ORDER — CHLORHEXIDINE GLUCONATE CLOTH 2 % EX PADS
6.0000 | MEDICATED_PAD | Freq: Every day | CUTANEOUS | Status: DC
  Administered 2020-07-06: 6 via TOPICAL

## 2020-07-06 MED ORDER — MUPIROCIN 2 % EX OINT
1.0000 "application " | TOPICAL_OINTMENT | Freq: Two times a day (BID) | CUTANEOUS | Status: AC
  Administered 2020-07-06 – 2020-07-11 (×10): 1 via NASAL
  Filled 2020-07-06: qty 22

## 2020-07-06 MED ORDER — ATORVASTATIN CALCIUM 20 MG PO TABS
20.0000 mg | ORAL_TABLET | Freq: Every day | ORAL | Status: DC
  Administered 2020-07-06 – 2020-07-12 (×7): 20 mg via ORAL
  Filled 2020-07-06 (×7): qty 1

## 2020-07-06 MED ORDER — DM-GUAIFENESIN ER 30-600 MG PO TB12
1.0000 | ORAL_TABLET | Freq: Two times a day (BID) | ORAL | Status: DC
  Administered 2020-07-06 – 2020-07-12 (×13): 1 via ORAL
  Filled 2020-07-06 (×13): qty 1

## 2020-07-06 MED ORDER — ORAL CARE MOUTH RINSE
15.0000 mL | Freq: Two times a day (BID) | OROMUCOSAL | Status: DC
  Administered 2020-07-06 – 2020-07-11 (×12): 15 mL via OROMUCOSAL

## 2020-07-06 MED ORDER — PENTAFLUOROPROP-TETRAFLUOROETH EX AERO: 1.0000 "application " | INHALATION_SPRAY | CUTANEOUS | Status: DC | PRN

## 2020-07-06 MED ORDER — CYCLOBENZAPRINE HCL 10 MG PO TABS
10.0000 mg | ORAL_TABLET | Freq: Two times a day (BID) | ORAL | Status: DC | PRN
  Administered 2020-07-06 – 2020-07-10 (×7): 10 mg via ORAL
  Filled 2020-07-06 (×7): qty 1

## 2020-07-06 MED ORDER — VANCOMYCIN HCL IN DEXTROSE 1-5 GM/200ML-% IV SOLN
1000.0000 mg | Freq: Once | INTRAVENOUS | Status: AC
  Administered 2020-07-06: 1000 mg via INTRAVENOUS
  Filled 2020-07-06: qty 200

## 2020-07-06 MED ORDER — SODIUM CHLORIDE 0.9 % IV SOLN
500.0000 mg | INTRAVENOUS | Status: DC
  Administered 2020-07-06: 500 mg via INTRAVENOUS
  Filled 2020-07-06: qty 500

## 2020-07-06 MED ORDER — VANCOMYCIN HCL 2000 MG/400ML IV SOLN: 2000.0000 mg | Freq: Once | INTRAVENOUS | Status: DC

## 2020-07-06 MED ORDER — LIDOCAINE HCL (PF) 1 % IJ SOLN: 5.0000 mL | INTRAMUSCULAR | Status: DC | PRN

## 2020-07-06 MED ORDER — INSULIN ASPART 100 UNIT/ML ~~LOC~~ SOLN
0.0000 [IU] | Freq: Three times a day (TID) | SUBCUTANEOUS | Status: DC
  Administered 2020-07-06: 1 [IU] via SUBCUTANEOUS
  Administered 2020-07-07: 2 [IU] via SUBCUTANEOUS
  Administered 2020-07-09: 1 [IU] via SUBCUTANEOUS
  Administered 2020-07-10 – 2020-07-11 (×2): 2 [IU] via SUBCUTANEOUS
  Administered 2020-07-11 – 2020-07-12 (×4): 1 [IU] via SUBCUTANEOUS

## 2020-07-06 MED ORDER — CLONIDINE HCL 0.1 MG PO TABS: 0.1000 mg | ORAL_TABLET | Freq: Two times a day (BID) | ORAL | Status: DC

## 2020-07-06 MED ORDER — LOSARTAN POTASSIUM 25 MG PO TABS: 25.0000 mg | ORAL_TABLET | Freq: Every day | ORAL | Status: DC

## 2020-07-06 MED ORDER — PAROXETINE HCL 20 MG PO TABS
20.0000 mg | ORAL_TABLET | Freq: Every evening | ORAL | Status: DC
  Administered 2020-07-06 – 2020-07-11 (×6): 20 mg via ORAL
  Filled 2020-07-06 (×10): qty 1

## 2020-07-06 MED ORDER — SODIUM CHLORIDE 0.9 % IV SOLN: 1.0000 g | INTRAVENOUS | Status: DC

## 2020-07-06 MED ORDER — VANCOMYCIN HCL 2000 MG/400ML IV SOLN
2000.0000 mg | Freq: Once | INTRAVENOUS | Status: DC
  Filled 2020-07-06: qty 400

## 2020-07-06 MED ORDER — MORPHINE SULFATE (PF) 2 MG/ML IV SOLN
2.0000 mg | Freq: Once | INTRAVENOUS | Status: AC
  Administered 2020-07-06: 2 mg via INTRAVENOUS
  Filled 2020-07-06: qty 1

## 2020-07-06 NOTE — Progress Notes (Signed)
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  Patient was having left arm pain for the last week, she had 2 short in her hemodialysis sessions last 2 times.  Also complaining of shortness of breath.  In the emergency department she was febrile.  Patient was 87% on room air.  In the ER with leukocytosis, CT scan abdomen pelvis with new bibasilar airspace disease concerning for pneumonia and mild left-sided collecting system dilatation with asymmetrical fatty stranding about the left ureter with concern for ascending UTI.  Patient occasionally makes urine.  She was started on treatment with antibiotics for pneumonia and admitted to the hospital.   Assessment & Plan:   Principal Problem:   CAP (community acquired pneumonia) Active Problems:   Morbid obesity ([place])   Essential hypertension   GERD   Sleep apnea   Type 2 diabetes mellitus treated with insulin (HCC)   Hyperglycemia due to diabetes mellitus ([place])   Bipolar 1 disorder (HCC)   ESRD (end stage renal disease) on dialysis ([place])   UTI (urinary tract infection)   Hypoalbuminemia   Acute respiratory failure with hypoxia (HCC)  Community-acquired pneumonia, gram-positive cocci bacteremia, acute hypoxemic respiratory failure: Suspect both gram-positive and gram-negative pneumonia.  With gram-positive bacteremia, will broaden coverage of antibiotics to vancomycin until final cultures.  We'll continue azithromycin to cover for atypical organisms.  incentive spirometry, deep breathing exercises, sputum induction, mucolytic's and bronchodilators. Sputum cultures, blood cultures, Legionella and streptococcal antigen. Supplemental  oxygen to keep saturations more than 90%.  Presumed UTI: As above.  Hyperglycemia secondary to type 2 diabetes: On insulin that is continued.  ESRD on hemodialysis: We'll be getting dialysis today.  Seen by nephrology.  Left forearm pain associated with dialysis: Fistula looks nontender.  No evidence of ischemia.  Pain is in present or aggravated during dialysis.  Will monitor.  Symptomatic treatment for now.  Adequate vascularity.  Sleep apnea: Using CPAP.   DVT prophylaxis: heparin injection 5,000 Units Start: 07/05/20 2330 SCDs Start: 07/05/20 2316   Code Status: Full code Family Communication: Patient's daughter on the phone Disposition Plan: Status is: Inpatient  Remains inpatient appropriate because:Inpatient level of care appropriate due to severity of illness   Dispo: The patient is from: Home              Anticipated d/c is to: Home              Anticipated d/c date is: 3 days              Patient currently is not medically stable to d/c.         Consultants:   Nephrology  Procedures:   Hemodialysis  Antimicrobials:  Anti-infectives (From admission, onward)   Start     Dose/Rate Route Frequency Ordered Stop   07/09/20 1200  vancomycin (VANCOCIN) IVPB 1000 mg/200 mL premix        1,000 mg 200 mL/hr over 60 Minutes Intravenous Every M-W-F (Hemodialysis) 07/06/20 1129     07/06/20 2200  cefTRIAXone (ROCEPHIN) 1 g in sodium chloride 0.9 % 100 mL IVPB  Status:  Discontinued        1 g 200 mL/hr over 30 Minutes  Intravenous Every 24 hours 07/06/20 0109 07/06/20 1041   07/06/20 2200  azithromycin (ZITHROMAX) 500 mg in sodium chloride 0.9 % 250 mL IVPB        500 mg 250 mL/hr over 60 Minutes Intravenous Every 24 hours 07/06/20 0109 07/10/20 2159   07/06/20 1800  vancomycin (VANCOREADY) IVPB 2000 mg/400 mL        2,000 mg 200 mL/hr over 120 Minutes Intravenous  Once 07/06/20 1212     07/06/20 1100  vancomycin (VANCOREADY) IVPB 2000 mg/400 mL  Status:   Discontinued        2,000 mg 200 mL/hr over 120 Minutes Intravenous  Once 07/06/20 1035 07/06/20 1212   07/05/20 2130  cefTRIAXone (ROCEPHIN) 1 g in sodium chloride 0.9 % 100 mL IVPB        1 g 200 mL/hr over 30 Minutes Intravenous  Once 07/05/20 2120 07/06/20 0005   07/05/20 2130  azithromycin (ZITHROMAX) tablet 500 mg        500 mg Oral  Once 07/05/20 2120 07/05/20 2336         Subjective: Patient was seen and examined.  No overnight events.  Patient was still in the emergency room when I examined her.  Remains afebrile overnight.  Continues to be complaining of left arm pain on movement.  Shortness of breath is slightly better since admission.  No family at the bedside.  Daughter on the phone.  Objective: Vitals:   07/06/20 0900 07/06/20 1000 07/06/20 1138 07/06/20 1200  BP: (!) 174/57 (!) 167/65 (!) 178/92 (!) 183/79  Pulse: 78 78 78 80  Resp: (!) 21 17 20  (!) 22  Temp:   98.8 F (37.1 C)   TempSrc:   Oral   SpO2: 98% 99% 98% 97%  Weight:      Height:        Intake/Output Summary (Last 24 hours) at 07/06/2020 1321 Last data filed at 07/06/2020 0005 Gross per 24 hour  Intake 100 ml  Output -  Net 100 ml   Filed Weights   07/05/20 1133  Weight: (!) 142.9 kg    Examination:  General exam: Chronically sick looking.  In mild distress and anxious. She is legally blind and even do not see waving of hands. Respiratory system: Clear to auscultation. Respiratory effort normal.  Mostly conducted airway sounds. Cardiovascular system: S1 & S2 heard, RRR. Marland Kitchen Gastrointestinal system: Obese and pendulous.  Nontender.  Bowel sounds present. Central nervous system: Alert and oriented. No focal neurological deficits. Extremities: Symmetric 5 x 5 power. Left upper extremity AV fistula present with thrill.  Nontender at the fistula. Tenderness with no swelling erythema or edema on the left arm.   Data Reviewed: I have personally reviewed following labs and imaging  studies  CBC: Recent Labs  Lab 07/02/20 1241 07/05/20 1813 07/06/20 0500  WBC 7.4 11.9* 11.2*  NEUTROABS 5.2 9.7*  --   HGB 11.1* 11.1* 10.7*  HCT 35.2* 36.4 35.2*  MCV 95.7 96.8 95.9  PLT 108* 225 465   Basic Metabolic Panel: Recent Labs  Lab 07/02/20 1021 07/05/20 1813 07/06/20 0500  NA 133* 132* 134*  K 4.5 4.6 4.6  CL 94* 91* 92*  CO2 21* 22 23  GLUCOSE 168* 158* 130*  BUN 65* 57* 68*  CREATININE 12.85* 12.35* 13.55*  CALCIUM 8.5* 8.3* 8.8*  MG  --   --  1.9  PHOS  --   --  6.5*   GFR: Estimated Creatinine Clearance: 7.2 mL/min (A) (  by C-G formula based on SCr of 13.55 mg/dL (H)). Liver Function Tests: Recent Labs  Lab 07/05/20 1813 07/06/20 0500  AST 29 16  ALT 38 26  ALKPHOS 146* 130*  BILITOT 1.6* 0.8  PROT 8.1 7.7  ALBUMIN 3.3* 3.0*   Recent Labs  Lab 07/05/20 1813  LIPASE 23   No results for input(s): AMMONIA in the last 168 hours. Coagulation Profile: Recent Labs  Lab 07/05/20 1813 07/06/20 0500  INR 1.3* 1.3*   Cardiac Enzymes: No results for input(s): CKTOTAL, CKMB, CKMBINDEX, TROPONINI in the last 168 hours. BNP (last 3 results) No results for input(s): PROBNP in the last 8760 hours. HbA1C: Recent Labs    07/05/20 1813  HGBA1C 6.0*   CBG: Recent Labs  Lab 07/06/20 0857 07/06/20 1154  GLUCAP 136* 152*   Lipid Profile: No results for input(s): CHOL, HDL, LDLCALC, TRIG, CHOLHDL, LDLDIRECT in the last 72 hours. Thyroid Function Tests: No results for input(s): TSH, T4TOTAL, FREET4, T3FREE, THYROIDAB in the last 72 hours. Anemia Panel: No results for input(s): VITAMINB12, FOLATE, FERRITIN, TIBC, IRON, RETICCTPCT in the last 72 hours. Sepsis Labs: Recent Labs  Lab 07/05/20 1813 07/05/20 2009  LATICACIDVEN 1.6 0.9    Recent Results (from the past 240 hour(s))  Resp Panel by RT-PCR (Flu A&B, Covid) Nasopharyngeal Swab     Status: None   Collection Time: 07/02/20 10:05 AM   Specimen: Nasopharyngeal Swab;  Nasopharyngeal(NP) swabs in vial transport medium  Result Value Ref Range Status   SARS Coronavirus 2 by RT PCR NEGATIVE NEGATIVE Final    Comment: (NOTE) SARS-CoV-2 target nucleic acids are NOT DETECTED.  The SARS-CoV-2 RNA is generally detectable in upper respiratory specimens during the acute phase of infection. The lowest concentration of SARS-CoV-2 viral copies this assay can detect is 138 copies/mL. A negative result does not preclude SARS-Cov-2 infection and should not be used as the sole basis for treatment or other patient management decisions. A negative result may occur with  improper specimen collection/handling, submission of specimen other than nasopharyngeal swab, presence of viral mutation(s) within the areas targeted by this assay, and inadequate number of viral copies(<138 copies/mL). A negative result must be combined with clinical observations, patient history, and epidemiological information. The expected result is Negative.  Fact Sheet for Patients:  EntrepreneurPulse.com.au  Fact Sheet for Healthcare Providers:  IncredibleEmployment.be  This test is no t yet approved or cleared by the [place] FDA and  has been authorized for detection and/or diagnosis of SARS-CoV-2 by FDA under an Emergency Use Authorization (EUA). This EUA will remain  in effect (meaning this test can be used) for the duration of the COVID-19 declaration under Section 564(b)(1) of the Act, 21 U.S.C.section 360bbb-3(b)(1), unless the authorization is terminated  or revoked sooner.       Influenza A by PCR NEGATIVE NEGATIVE Final   Influenza B by PCR NEGATIVE NEGATIVE Final    Comment: (NOTE) The Xpert Xpress SARS-CoV-2/FLU/RSV plus assay is intended as an aid in the diagnosis of influenza from Nasopharyngeal swab specimens and should not be used as a sole basis for treatment. Nasal washings and aspirates are unacceptable for Xpert Xpress  SARS-CoV-2/FLU/RSV testing.  Fact Sheet for Patients: EntrepreneurPulse.com.au  Fact Sheet for Healthcare Providers: IncredibleEmployment.be  This test is not yet approved or cleared by the [place] FDA and has been authorized for detection and/or diagnosis of SARS-CoV-2 by FDA under an Emergency Use Authorization (EUA). This EUA will remain in effect (meaning this test  can be used) for the duration of the COVID-19 declaration under Section 564(b)(1) of the Act, 21 U.S.C. section 360bbb-3(b)(1), unless the authorization is terminated or revoked.  Performed at [facility], [address] [place], [place] [address]   Culture, blood (single)     Status: None (Preliminary result)   Collection Time: 07/05/20  6:11 PM   Specimen: BLOOD  Result Value Ref Range Status   Specimen Description BLOOD BLOOD RIGHT WRIST  Final   Special Requests   Final    BOTTLES DRAWN AEROBIC AND ANAEROBIC Blood Culture results may not be optimal due to an inadequate volume of blood received in culture bottles   Culture  Setup Time   Final    GRAM POSITIVE COCCI ANAEROBIC AND AEROBIC BOTTLES  Gram Stain Report Called to,Read Back By and Verified With: LOOMIS,K@1150  BY MATTHEWS,B12.10.21    Culture   Final    NO GROWTH < 12 HOURS Performed at [facility], [address] [place], [place] [address]    Report Status PENDING  Incomplete  Blood culture (routine single)     Status: None (Preliminary result)   Collection Time: 07/05/20  6:13 PM   Specimen: BLOOD RIGHT WRIST  Result Value Ref Range Status   Specimen Description   Final    BLOOD RIGHT WRIST Performed at [facility], [address], [place], [name] [address]    Special Requests   Final    BOTTLES DRAWN AEROBIC AND ANAEROBIC Blood Culture results may not be optimal due to an inadequate volume of blood received in culture bottles Performed at [facility], [address] [place], [place] [address]     Culture  Setup Time   Final    GRAM POSITIVE COCCI BOTH ANAEROBIC AND AEROBIC BOTTLES Gram Stain Report Called to,Read Back By and Verified With: PARNELL,R @0945  BY MATTHEWS B 12.10.21 Organism ID to follow Performed at [facility], [place] [address] [place], [place] [address]    Culture GRAM POSITIVE COCCI  Final   Report Status PENDING  Incomplete  Resp Panel by RT-PCR (Flu A&B, Covid) Nasopharyngeal Swab     Status: None   Collection Time: 07/05/20  9:49 PM   Specimen: Nasopharyngeal Swab; Nasopharyngeal(NP) swabs in vial transport medium  Result Value Ref Range Status   SARS Coronavirus 2 by RT PCR NEGATIVE NEGATIVE Final    Comment: (NOTE) SARS-CoV-2 target nucleic acids are NOT DETECTED.  The SARS-CoV-2 RNA is generally detectable in upper respiratory specimens during the acute phase of infection. The lowest concentration of SARS-CoV-2 viral copies this assay can detect is 138 copies/mL. A negative result does not preclude SARS-Cov-2 infection and should not be used as the sole basis for treatment or other patient management decisions. A negative result may occur with  improper specimen collection/handling, submission of specimen other than nasopharyngeal swab, presence of viral mutation(s) within the areas targeted by this assay, and inadequate number of viral copies(<138 copies/mL). A negative result must be combined with clinical observations, patient history, and epidemiological information. The expected result is Negative.  Fact Sheet for Patients:  EntrepreneurPulse.com.au  Fact Sheet for Healthcare Providers:  IncredibleEmployment.be  This test is no t yet approved or cleared by the [place] FDA and  has been authorized for detection and/or diagnosis of SARS-CoV-2 by FDA under an Emergency Use Authorization (EUA). This EUA will remain  in effect (meaning this test can be used) for the duration of the COVID-19  declaration under Section 564(b)(1) of  the Act, 21 U.S.C.section 360bbb-3(b)(1), unless the authorization is terminated  or revoked sooner.       Influenza A by PCR NEGATIVE NEGATIVE Final   Influenza B by PCR NEGATIVE NEGATIVE Final    Comment: (NOTE) The Xpert Xpress SARS-CoV-2/FLU/RSV plus assay is intended as an aid in the diagnosis of influenza from Nasopharyngeal swab specimens and should not be used as a sole basis for treatment. Nasal washings and aspirates are unacceptable for Xpert Xpress SARS-CoV-2/FLU/RSV testing.  Fact Sheet for Patients: EntrepreneurPulse.com.au  Fact Sheet for Healthcare Providers: IncredibleEmployment.be  This test is not yet approved or cleared by the [place] FDA and has been authorized for detection and/or diagnosis of SARS-CoV-2 by FDA under an Emergency Use Authorization (EUA). This EUA will remain in effect (meaning this test can be used) for the duration of the COVID-19 declaration under Section 564(b)(1) of the Act, 21 U.S.C. section 360bbb-3(b)(1), unless the authorization is terminated or revoked.  Performed at [facility], [address], [place], [place] [address]          Radiology Studies: CT Abdomen Pelvis W Contrast  Result Date: 07/05/2020 CLINICAL DATA:  Abdominal pain. EXAM: CT ABDOMEN AND PELVIS WITH CONTRAST TECHNIQUE: Multidetector CT imaging of the abdomen and pelvis was performed using the standard protocol following bolus administration of intravenous contrast. CONTRAST:  111mL OMNIPAQUE IOHEXOL 300 MG/ML  SOLN COMPARISON:  CT dated July 04, 2020 FINDINGS: Lower chest: There is new bibasilar airspace disease concerning for pneumonia.The heart is enlarged. Hepatobiliary: The liver is normal. Status post cholecystectomy.There is no biliary ductal dilation. Pancreas: Normal contours without ductal dilatation. No peripancreatic fluid collection. Spleen: The spleen is enlarged.  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 Adrenals/Urinary Tract: --Adrenal glands: Unremarkable. --Right kidney/ureter: The right kidney is atrophic without evidence for hydronephrosis. --Left kidney/ureter: The left kidney is atrophic. There is mild left-sided collecting system dilatation with asymmetric fat stranding about the left ureter. --Urinary bladder: Unremarkable. Stomach/Bowel: --Stomach/Duodenum: No hiatal hernia or other gastric abnormality. Normal duodenal course and caliber. --Small bowel: Unremarkable. --Colon: Unremarkable. --Appendix: Normal. Vascular/Lymphatic: Atherosclerotic calcification is present within the non-aneurysmal abdominal aorta, without hemodynamically significant stenosis. --No retroperitoneal lymphadenopathy. --No mesenteric lymphadenopathy. --No pelvic or inguinal lymphadenopathy. Reproductive: Unremarkable Other: No ascites or free air. The abdominal wall is normal. Musculoskeletal. No acute displaced fractures. IMPRESSION: 1. New bibasilar airspace disease concerning for pneumonia. 2. Mild left-sided collecting system dilatation with asymmetric fat stranding about the left ureter. Correlation with urinalysis is recommended as findings may represent an ascending urinary tract infection. 3. 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 Aortic Atherosclerosis (ICD10-I70.0). Electronically Signed   By: Constance Holster M.D.   On: 07/05/2020 21:15   DG Chest Port 1 View  Result Date: 07/05/2020 CLINICAL DATA:  Questionable sepsis - evaluate for abnormality EXAM: PORTABLE CHEST 1 VIEW COMPARISON:  07/02/2020 and prior FINDINGS: No focal consolidation, pneumothorax or pleural effusion. Mild cardiomegaly, unchanged. No acute osseous abnormality. IMPRESSION: No focal airspace disease. Unchanged mild cardiomegaly. Electronically Signed   By: Primitivo Gauze M.D.   On:  07/05/2020 19:02        Scheduled Meds: . atorvastatin  20 mg Oral Daily  . Chlorhexidine Gluconate Cloth  6 each Topical Daily  . dextromethorphan-guaiFENesin  1 tablet Oral BID  . feeding supplement (GLUCERNA SHAKE)  237 mL Oral TID BM  . gabapentin  300 mg Oral QHS  .  heparin  5,000 Units Subcutaneous Q8H  . insulin aspart  0-5 Units Subcutaneous QHS  . insulin aspart  0-6 Units Subcutaneous TID WC  . mouth rinse  15 mL Mouth Rinse BID  . pantoprazole  40 mg Oral Daily  . PARoxetine  20 mg Oral QPM   Continuous Infusions: . azithromycin    . [START ON 07/09/2020] vancomycin    . vancomycin       LOS: 1 day    Time spent: 35 minutes    Barb Merino, MD [facility] Pager [phone]

## 2020-07-06 NOTE — Consult Note (Signed)
Virtual ID/ASP consult note    54 y.o. female with bipolar, htn, dm2, copd, chf, esrd on iHD admitted 12/09 with sepsis found to have mssa bacteremia and sob with relative hypoxemic respiratory distress in setting of 5 days left arm pain (where dialysis av fistula is)  Chart mentioned recent dialysis having to be shortened due to left UE pain although it is not reported to be at the site of the fistula and there is not issue with bleeding/canulation  Noted to be 87% on room air on arrival; tmax 100.6 Leukocytosis 11.2 Ct abd/pelv with contrast showed bibasilar airspace opacity and mild left sided collecting system dilatation with fat stranding around left ureter (no urinary sx)  Started on ctrx/azithromycin; vanc added later after bcx showed mrsa  Labs reviewed: 12/09 bcx mrsa        [place] [facility]icrobial Management Team Staphylococcus aureus bacteremia   Staphylococcus aureus bacteremia (SAB) is associated with a high rate of complications and mortality.  Specific aspects of clinical management are critical to optimizing the outcome of patients with SAB.  Therefore, the [facility] Antimicrobial Management Team [facility]) has initiated an intervention aimed at improving the management of SAB at [facility].  To do so, Infectious Diseases physicians are providing an evidence-based consult for the management of all patients with SAB.     Yes No Comments  Perform follow-up blood cultures (even if the patient is afebrile) to ensure clearance of bacteremia [x]  []    Remove vascular catheter and obtain follow-up blood cultures after the removal of the catheter [x]  []    Perform echocardiography to evaluate for endocarditis (transthoracic ECHO is 40-50% sensitive, TEE is > 90% sensitive) [x]  []  Please keep in mind, that neither test can definitively EXCLUDE endocarditis, and that should clinical suspicion remain high for endocarditis the patient should then still be treated with an "endocarditis"  duration of therapy = 6 weeks  Consult electrophysiologist to evaluate implanted cardiac device (pacemaker, ICD) []  []    Ensure source control [x]  []  Have all abscesses been drained effectively? Have deep seeded infections (septic joints or osteomyelitis) had appropriate surgical debridement?  Investigate for "metastatic" sites of infection [x]  []  Does the patient have ANY symptom or physical exam finding that would suggest a deeper infection (back or neck pain that may be suggestive of vertebral osteomyelitis or epidural abscess, muscle pain that could be a symptom of pyomyositis)?  Keep in mind that for deep seeded infections MRI imaging with contrast is preferred rather than other often insensitive tests such as plain x-rays, especially early in a patient's presentation.  Change antibiotic therapy to __________________ []  []  Beta-lactam antibiotics are preferred for MSSA due to higher cure rates.   If on Vancomycin, goal trough should be 15 - 20 mcg/mL  Estimated duration of IV antibiotic therapy:  4-6 weeks [x]  []  Consult case management for probably prolonged outpatient IV antibiotic therapy     A/p Community onset mrsa BSI of several days Sepsis Mild hypoxic resp failure esrd on ihd via left UE avf Nonspecific left renal collecting system abnormality  Suspect primary mrsa bacteremia with presentation Serial cxr 12/6 and 12/9 no obvious abnormality. Ct finding could suggest atelectasis; also shortened dialysis and HFpEF could be volume related  No GU sx, unclear what to make of the GU finding, but descending infection from staph aureus BSI is a possibility  pecular LUE pain --> would consider w/u for infected thrombus vs cervical referred pain  -repeat blood culture ordered for 12/11;  please repeat until 72 hours persistently negative -w/u metastatic foci (mri any focal osseous tenderness); and would consider getting ct angio of LUE as well -TTE: if negative, proceed with tee -if  present any temporary catheter, remove if feasible  -if blood cx without other organisms and clinically no sign/syndrome of pna, could consider stopping azithromycin/ceftriaxone in 1-2 days -continue vancomycin -duration of vancomycin will be at least 4 weeks given community onset/presence of dm/esrd, and at least 6-8 weeks if endocarditis or osseous infection   -please reengage id if any other concerning finding on diagnostics mentioned or persistent bacteremia/fever

## 2020-07-06 NOTE — ED Notes (Signed)
Date and time results received: 07/06/20 11:16 AM  Test: Aerobic/Anaerobic Blood Cultures Critical Value: Gram Positive Cocci  Name of Provider Notified: Barb Merino, MD  MD Called and notified of critical value.

## 2020-07-06 NOTE — Progress Notes (Addendum)
Pharmacy Antibiotic Note  Martha [name] is a 54 y.o. female admitted on 07/05/2020 with bacteremia.  Pharmacy has been consulted for Vancomycin dosing.  Plan: Vancomycin 2000mg  Loading dose, then 1000mg  after every HD session on MWF Patient also on azithromycin 500mg  IV q24h F/U cxs and clinical progress Monitor V/S, labs, and levels as indicated  Height: 5\' 7"  (170.2 cm) Weight: (!) 142.9 kg (315 lb) IBW/kg (Calculated) : 61.6  Temp (24hrs), Avg:99.9 F (37.7 C), Min:99.2 F (37.3 C), Max:100.6 F (38.1 C)  Recent Labs  Lab 07/02/20 1021 07/02/20 1241 07/05/20 1813 07/05/20 2009 07/06/20 0500  WBC  --  7.4 11.9*  --  11.2*  CREATININE 12.85*  --  12.35*  --  13.55*  LATICACIDVEN  --   --  1.6 0.9  --     Estimated Creatinine Clearance: 7.2 mL/min (A) (by C-G formula based on SCr of 13.55 mg/dL (H)).    Allergies  Allergen Reactions  . Codeine Nausea And Vomiting  . Tape Rash and Other (See Comments)    Pull skin off.  Paper tape is ok    Antimicrobials this admission: Azithromycin 12/9  >> Ceftriaxone 12/9 >> 12/10 Vancomycin 12/10>>  Dose adjustments this admission: prn  Microbiology results: 12/10 BCx: GPC in 1 bottle  MRSA PCR:   Thank you for allowing pharmacy to be a part of this patient's care.  Isac Sarna, BS Pharm D, BCPS Clinical Pharmacist Pager [phone]  Addendum:  BCID + 3 out of 4 bottle MRSA Staphylococcus aureus (BCID) NOT DETECTED DETECTEDAbnormal   Comment: Methicillin (oxacillin)-resistant Staphylococcus aureus (MRSA). MRSA is predictably resistant to beta-lactam antibiotics (except ceftaroline). Preferred therapy is vancomycin unless clinically contraindicated. Patient requires contact precautions if  hospitalized.   Notified Dr. Bonna Gains, Sidda Humm L 07/06/2020 11:13 AM

## 2020-07-06 NOTE — ED Notes (Signed)
Pt was given a breakfast tray 

## 2020-07-06 NOTE — Procedures (Signed)
   HEMODIALYSIS TREATMENT NOTE:  Pt c/o severe left forearm pain and struggled to extend her arm for cannulation.  AVF was cannulated without difficulty and tolerated prescribed flow with stable pressures.  BCs revealed MRSA bacteremia and Vancomycin 2g loading dose was given just prior to HD by primary RN.  Pharmacist ordered an additional 1g dose to be given during last hour of dialysis.   4 hour heparin-free session was completed.  UF was suspended for a total of 30 minutes for hypotension.  Net UF 2.6 L.  All blood was returned and hemostasis was achieved in 15 minutes.  No changes from pre-dialysis assessment.  Rockwell Alexandria, RN

## 2020-07-06 NOTE — Consult Note (Signed)
Martha [name] Admit Date: 07/05/2020 07/06/2020 Martha [name] Requesting Physician:  Martha Mora, MD  Reason for Consult:  ESRD comanagement, pain in LUE with AVF HPI:  43F ESRD MWF LUE AVF [facility] presented overnight with pain in the left upper arm and found to have hypoxia and fever with concern for pneumonia.  Patient received dialysis last Wednesday, and shorten her treatment because of pain in the hand.  As an outpatient she has been referred to vascular surgery.  The pain is in the distal arm and hand, especially over the dorsal aspect of the first 3 digits.  There have been no ulcers.  The pain is not at the site of her fistula.  She recounts no recent issues with cannulation, hematoma.  No recent procedures on her fistula.  The patient is been placed on azithromycin and ceftriaxone for potential pneumonia.  Testing for flu and COVID-19 are negative.  Blood cultures are pending.  Labs are notable for K of 4.6, HCO3 23, BUN 68.  Hemoglobin is 10.7.  WBC is 11.2.  Partial outpatient prescription: BFR 400, DFR 800, ?if dry weight is 144 kg  Patient with GPC's in 1 out of 2 blood cultures on 12/9.  Placed on vancomycin  PMH Incudes:  DM2  COPD  Morbid obesity  History of heart failure  Legal blindness  BPAD  Chronic pain  ROS Balance of 12 systems is negative w/ exceptions as above  PMH  Past Medical History:  Diagnosis Date  . Acid reflux    takes Tums  . Anemia   . Arthritis   . Bipolar 1 disorder ([place])   . Blindness of left eye    left eye removed  . Carpal tunnel syndrome, bilateral   . Cervical radiculopathy   . CHF (congestive heart failure) ([place])   . Chronic kidney disease    Stage 5- 01/25/17  . Constipation   . COPD (chronic obstructive pulmonary disease) ([place]) 2014   bronchitis  . Degenerative disc disease, thoracic   . Depression   . Diabetes mellitus    Type II  . Diabetic retinopathy ([place])   . Dyspnea    when walking  . End stage  kidney disease ([place])    M, W, F [facility]   . Gout   . Hypertension   . Noncompliance with medication regimen   . Noncompliance with medication regimen   . Obesity (BMI 30-39.9)   . OSA (obstructive sleep apnea)    cpap  . Panic attack   . RLS (restless legs syndrome)    PSH  Past Surgical History:  Procedure Laterality Date  . AV FISTULA PLACEMENT Right 01/27/2017   Procedure: ARTERIOVENOUS (AV) FISTULA CREATION-RIGHT ARM;  Surgeon: Elam Dutch, MD;  Location: [facility] OR;  Service: Vascular;  Laterality: Right;  . AV FISTULA PLACEMENT Left 08/03/2018   Procedure: LEFT ARTERIOVENOUS (AV) FISTULA CREATION;  Surgeon: Elam Dutch, MD;  Location: [place];  Service: Vascular;  Laterality: Left;  . BASCILIC VEIN TRANSPOSITION Left 04/26/2019   Procedure: SECOND STAGE BASILIC VEIN TRANSPOSITION LEFT ARM;  Surgeon: Elam Dutch, MD;  Location: [place];  Service: Vascular;  Laterality: Left;  . BIOPSY N/A 05/17/2013   Procedure: BIOPSY;  Surgeon: [name], MD;  Location: [facility];  Service: Endoscopy;  Laterality: N/A;  . CARPAL TUNNEL RELEASE Left 11/25/2018   Procedure: LEFT CARPAL TUNNEL RELEASE;  Surgeon: Charlotte Crumb, MD;  Location: [place];  Service: Orthopedics;  Laterality:  Left;  . CATARACT EXTRACTION Right   . CHOLECYSTECTOMY N/A 04/27/2014   Procedure: LAPAROSCOPIC CHOLECYSTECTOMY;  Surgeon: [name], MD;  Location: [facility];  Service: General;  Laterality: N/A;  . COLONOSCOPY  06/06/2010   WGN:FAOZHY bleeding secondary to internal hemorrhoids but incomplete evaluation secondary to poor right colon prep/small rectal and sigmoid colon polyps (hyperplastic). PROPOFOL  . COLONOSCOPY  May 2013   Dr. Gilliam/[facility]: 5 mm a descending colon polyp, hyperplastic. Adequate bowel prep.  . ENDOMETRIAL ABLATION    . 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 ENUCLEATION Left 02/04/2018   Procedure: ENUCLEATION WITH PLACEMENT OF  IMPLANT LEFT EYE;  Surgeon: Clista Bernhardt, MD;  Location: [place];  Service: Ophthalmology;  Laterality: Left;  . ESOPHAGOGASTRODUODENOSCOPY  06/06/2010   QMV:HQIONGE erythema and edema of body of stomach, with sessile polypoid lesions. bx benign. no h.pylori  . ESOPHAGOGASTRODUODENOSCOPY (EGD) WITH PROPOFOL N/A 05/17/2013   Dr. Oneida Alar: normal esophagus, moderate nodular gastritis, negative path, empiric Savary dilation  . EYE SURGERY  07/2017   sx for glaucoma  . FLEXIBLE SIGMOIDOSCOPY N/A 05/17/2013   Dr. Oneida Alar: moderate sized internal hemorrhoids  . HEMORRHOID BANDING N/A 05/17/2013   Procedure: HEMORRHOID BANDING;  Surgeon: [name], MD;  Location[facility];  Service: Endoscopy;  Laterality: N/A;  2 bands placed  . INCISION AND DRAINAGE ABSCESS Left 10/11/2013   Procedure: INCISION AND DRAINAGE AND DEBRIDEMENT LEFT BREAST  ABSCESS;  Surgeon: [name], MD;  Location[facility];  Service: General;  Laterality: Left;  . INSERTION OF DIALYSIS CATHETER N/A 06/08/2018   Procedure: INSERTION OF Right internal jugular TUNNELED  DIALYSIS CATHETER;  Surgeon: Angelia Mould, MD;  Location: [place];  Service: Vascular;  Laterality: N/A;  . IRRIGATION AND DEBRIDEMENT ABSCESS Right 06/01/2013   Procedure: INCISION AND DRAINAGE AND DEBRIDEMENT ABSCESS RIGHT BREAST;  Surgeon: [name], MD;  Location[facility];  Service: General;  Laterality: Right;  . LEFT EYE REMOVED Left 01/2018   [facility] [address].  Marland Kitchen LIGATION OF ARTERIOVENOUS  FISTULA Right 06/08/2018   Procedure: LIGATION OF ARTERIOVENOUS  FISTULA RIGHT ARM;  Surgeon: Angelia Mould, MD;  Location: [place];  Service: Vascular;  Laterality: Right;  . ORIF ANKLE FRACTURE Right 12/17/2018   Procedure: OPEN REDUCTION INTERNAL FIXATION (ORIF) ANKLE FRACTURE;  Surgeon: Edrick Kins, DPM;  Location: [place];  Service: Podiatry;  Laterality: Right;  . SAVORY DILATION N/A 05/17/2013   Procedure: SAVORY  DILATION;  Surgeon: [name], MD;  Location[facility];  Service: Endoscopy;  Laterality: N/A;  14/15/16  . SKIN FULL THICKNESS GRAFT Left 05/05/2018   Procedure: ABDOMINAL DERMIS FAT SKIN GRAFT FULL THICKNESS LEFT EYE;  Surgeon: Clista Bernhardt, MD;  Location: [place];  Service: Ophthalmology;  Laterality: Left;  . TUBAL LIGATION     FH  Family History  Adopted: Yes  Family history unknown: Yes   [place]  reports that she quit smoking about 12 years ago. Her smoking use included cigarettes. She has a 37.50 pack-year smoking history. She has never used smokeless tobacco. She reports that she does not drink alcohol and does not use drugs. Allergies  Allergies  Allergen Reactions  . Codeine Nausea And Vomiting  . Tape Rash and Other (See Comments)    Pull skin off.  Paper tape is ok   Home medications Prior to Admission medications   Medication Sig Start Date End  Date Taking? Authorizing Provider  albuterol (PROVENTIL) (2.5 MG/3ML) 0.083% nebulizer solution Take 2.5 mg by nebulization every 6 (six) hours as needed for wheezing or shortness of breath.    [provider]  ALPRAZolam Duanne Moron) 1 MG tablet Take 1 mg by mouth 4 (four) times daily as needed for anxiety.    [provider]  atorvastatin (LIPITOR) 20 MG tablet Take 20 mg by mouth daily.     [provider]  baclofen (LIORESAL) 10 MG tablet Take 10 mg by mouth 2 (two) times daily.    [provider]  bisacodyl (DULCOLAX) 10 MG suppository Place 1 suppository (10 mg total) rectally daily as needed for moderate constipation. 12/03/18   Sinda Du, MD  busPIRone (BUSPAR) 7.5 MG tablet Take 7.5 mg by mouth 2 (two) times daily.    [provider]  calcium carbonate (TUMS EX) 750 MG chewable tablet Chew 2-4 tablets by mouth See admin instructions. Take 4 tablet with each meal & take 2 tablets with each snack    [provider]  cetirizine (ZYRTEC) 10 MG tablet Take 10 mg by mouth daily.     [provider]  cinacalcet (SENSIPAR) 30 MG tablet Take 30 mg by mouth daily.    [provider]  clindamycin (CLEOCIN) 300 MG capsule Take 1 capsule (300 mg total) by mouth 3 (three) times daily. X 7 days 03/06/20   [name], MD  cloNIDine (CATAPRES) 0.1 MG tablet Take 0.1 mg by mouth 2 (two) times daily.    [provider]  colchicine 0.6 MG tablet Take 1 tablet (0.6 mg total) by mouth every Tuesday, Thursday, and Saturday at 6 PM. Take 1 tablet every other day Patient taking differently: Take 0.6 mg by mouth every Tuesday, Thursday, and Saturday at 6 PM.  09/30/18   Johnson, Clanford L, MD  cyclobenzaprine (FLEXERIL) 10 MG tablet Take 1 tablet (10 mg total) by mouth 2 (two) times daily as needed for muscle spasms. 08/17/19   Milton Ferguson, MD  diphenhydrAMINE (BENADRYL) 25 mg capsule Take 1 capsule (25 mg total) by mouth every 6 (six) hours as needed for itching. 12/03/18   Sinda Du, MD  doxercalciferol (HECTOROL) 4 MCG/2ML injection Inject 0.5 mLs (1 mcg total) into the vein every Monday, Wednesday, and Friday with hemodialysis. 12/03/18   Sinda Du, MD  epoetin alfa (EPOGEN) 10000 UNIT/ML injection Inject 1 mL (10,000 Units total) into the vein 3 (three) times a week. 12/03/18   Sinda Du, MD  gabapentin (NEURONTIN) 300 MG capsule Take 300 mg by mouth at bedtime.    [provider]  HYDROcodone-acetaminophen (NORCO) 10-325 MG tablet Take 1 tablet by mouth every 6 (six) hours as needed (pain.). Prescription needed for SNF admission Patient taking differently: Take 1 tablet by mouth 4 (four) times daily. Prescription needed for SNF admission 12/19/18   Donne Hazel, MD  insulin aspart (NOVOLOG) 100 UNIT/ML injection Inject 0-9 Units into the skin 3 (three) times daily with meals. Patient taking differently: Inject 2-15 Units into the skin 3 (three) times daily with meals. Sliding scale insulin 12/02/18   Sinda Du, MD  Insulin Glargine  [facility]) 100 UNIT/ML SOPN Inject 30 Units into the skin at bedtime.     [provider]  lidocaine-prilocaine (EMLA) cream Apply 1 application topically as needed (topical anesthesia for hemodialysis if Gebauers and Lidocaine injection are ineffective.). Patient taking differently: Apply 1 application topically daily as needed (dialysis.).  04/25/17   Wynetta Emery,  Clanford L, MD  losartan (COZAAR) 25 MG tablet Take 25 mg by mouth daily.    [provider]  omeprazole (PRILOSEC) 40 MG capsule Take 40 mg by mouth daily.    [provider]  ondansetron (ZOFRAN) 4 MG tablet Take 1 tablet (4 mg total) by mouth every 6 (six) hours as needed for nausea. Patient taking differently: Take 4 mg by mouth every 4 (four) hours as needed for nausea.  12/02/18   Sinda Du, MD  PARoxetine (PAXIL) 20 MG tablet Take 1 daily. This is to prevent panic attacks Patient taking differently: Take 20 mg by mouth every evening. This is to prevent panic attacks 10/28/16   Sinda Du, MD  polyethylene glycol (MIRALAX / GLYCOLAX) 17 g packet Take 17 g by mouth daily as needed for mild constipation. 12/02/18   Sinda Du, MD  torsemide (DEMADEX) 100 MG tablet Take 100 mg by mouth 2 (two) times daily.    [provider]    Current Medications Scheduled Meds: . atorvastatin  20 mg Oral Daily  . dextromethorphan-guaiFENesin  1 tablet Oral BID  . feeding supplement (GLUCERNA SHAKE)  237 mL Oral TID BM  . gabapentin  300 mg Oral QHS  . heparin  5,000 Units Subcutaneous Q8H  . insulin aspart  0-5 Units Subcutaneous QHS  . insulin aspart  0-6 Units Subcutaneous TID WC  . pantoprazole  40 mg Oral Daily  . PARoxetine  20 mg Oral QPM   Continuous Infusions: . azithromycin    . cefTRIAXone (ROCEPHIN)  IV    . vancomycin     PRN Meds:.cyclobenzaprine  CBC Recent Labs  Lab 07/02/20 1241 07/05/20 1813 07/06/20 0500  WBC 7.4 11.9* 11.2*  NEUTROABS 5.2 9.7*  --   HGB 11.1*  11.1* 10.7*  HCT 35.2* 36.4 35.2*  MCV 95.7 96.8 95.9  PLT 108* 225 161   Basic Metabolic Panel Recent Labs  Lab 07/02/20 1021 07/05/20 1813 07/06/20 0500  NA 133* 132* 134*  K 4.5 4.6 4.6  CL 94* 91* 92*  CO2 21* 22 23  GLUCOSE 168* 158* 130*  BUN 65* 57* 68*  CREATININE 12.85* 12.35* 13.55*  CALCIUM 8.5* 8.3* 8.8*  PHOS  --   --  6.5*    Physical Exam  Blood pressure (!) 167/65, pulse 78, temperature 99.8 F (37.7 C), temperature source Oral, resp. rate 17, height 5\' 7"  (1.702 m), weight (!) 142.9 kg, last menstrual period 08/03/2008, SpO2 99 %. GEN: NAD, obese, in mild distress ENT: NCAT EYES: Legal blindness CV: Regular, normal S1 and S2 PULM: Clear bilaterally on anterior auscultation ABD: Soft, nontender SKIN: No rashes or lesions EXT: No peripheral edema VASCULAR: Left upper arm AV fistula without hematoma or ecchymosis, bruit and thrill present, no visible lesions.  Distal hand is warm, well-perfused, no ulcers in the digits   Assessment 80F ESRD MWF with fever, hypoxia, question of pneumonia and worsening pain in the distal portion of the left arm ipsilateral to AV fistula.  1. Left upper extremity pain, on same side as AV fistula: Main concern would be steal but the hand is well perfused and no evidence of ischemia at the current time.  She needs outpatient vascular surgery evaluation, potentially will require inpatient if unable to tolerate dialysis at which point she would need transfer to [facility].  Will attempt cannulation today and see how dialysis goes. 2. ESRD, MWF, [facility]: HD today, 2K bath, 4 hours, QB 350-400, no heparin, goal  2 to 3 L ultrafiltration 3. Pneumonia, on vancomycin and ceftriaxone, per hospitalist 4. 1 out of 2 GPC's on blood culture, contaminant versus bacteremia, on vancomycin, trend, no clear evidence of infection of the site of AV fistula 5. HTN/volume, gentle UF today.  Blood pressures are acceptable. 6. Question  baclofen use on outpatient medication list, baclofen is not advised when ESRD 7. CKD-BMD: Mild hyperphosphatemia, calcium at target, unclear if takes binders, will trend for now and 8. Anemia: Hemoglobin stable at 10.7  Plan 1. As above   Martha [name]  07/06/2020, 10:36 AM

## 2020-07-06 NOTE — Progress Notes (Signed)
CRITICAL VALUE ALERT  Critical Value:  Blood cultures positive for Gram positive Cocci- MRSA   Date & Time Notied:  8546  Provider Notified: Dr. Barb Merino   Orders Received/Actions taken: No new orders at this time

## 2020-07-07 ENCOUNTER — Inpatient Hospital Stay (HOSPITAL_COMMUNITY): Payer: Medicaid Other

## 2020-07-07 DIAGNOSIS — I361 Nonrheumatic tricuspid (valve) insufficiency: Secondary | ICD-10-CM

## 2020-07-07 DIAGNOSIS — R7881 Bacteremia: Secondary | ICD-10-CM

## 2020-07-07 DIAGNOSIS — I340 Nonrheumatic mitral (valve) insufficiency: Secondary | ICD-10-CM

## 2020-07-07 LAB — CBC WITH DIFFERENTIAL/PLATELET
Abs Immature Granulocytes: 0.16 10*3/uL — ABNORMAL HIGH (ref 0.00–0.07)
Basophils Absolute: 0 10*3/uL (ref 0.0–0.1)
Basophils Relative: 0 %
Eosinophils Absolute: 0 10*3/uL (ref 0.0–0.5)
Eosinophils Relative: 0 %
HCT: 31.5 % — ABNORMAL LOW (ref 36.0–46.0)
Hemoglobin: 9.7 g/dL — ABNORMAL LOW (ref 12.0–15.0)
Immature Granulocytes: 2 %
Lymphocytes Relative: 9 %
Lymphs Abs: 1 10*3/uL (ref 0.7–4.0)
MCH: 29.7 pg (ref 26.0–34.0)
MCHC: 30.8 g/dL (ref 30.0–36.0)
MCV: 96.3 fL (ref 80.0–100.0)
Monocytes Absolute: 0.9 10*3/uL (ref 0.1–1.0)
Monocytes Relative: 8 %
Neutro Abs: 8.7 10*3/uL — ABNORMAL HIGH (ref 1.7–7.7)
Neutrophils Relative %: 81 %
Platelets: 202 10*3/uL (ref 150–400)
RBC: 3.27 MIL/uL — ABNORMAL LOW (ref 3.87–5.11)
RDW: 17.7 % — ABNORMAL HIGH (ref 11.5–15.5)
WBC: 10.8 10*3/uL — ABNORMAL HIGH (ref 4.0–10.5)
nRBC: 0 % (ref 0.0–0.2)

## 2020-07-07 LAB — ECHOCARDIOGRAM COMPLETE
Area-P 1/2: 4.1 cm2
Height: 67 in
S' Lateral: 2.56 cm
Weight: 4987.69 oz

## 2020-07-07 LAB — BASIC METABOLIC PANEL
Anion gap: 13 (ref 5–15)
BUN: 35 mg/dL — ABNORMAL HIGH (ref 6–20)
CO2: 28 mmol/L (ref 22–32)
Calcium: 8.5 mg/dL — ABNORMAL LOW (ref 8.9–10.3)
Chloride: 91 mmol/L — ABNORMAL LOW (ref 98–111)
Creatinine, Ser: 7.46 mg/dL — ABNORMAL HIGH (ref 0.44–1.00)
GFR, Estimated: 6 mL/min — ABNORMAL LOW (ref >60)
Glucose, Bld: 167 mg/dL — ABNORMAL HIGH (ref 70–99)
Potassium: 3.8 mmol/L (ref 3.5–5.1)
Sodium: 132 mmol/L — ABNORMAL LOW (ref 135–145)

## 2020-07-07 LAB — MAGNESIUM: Magnesium: 1.8 mg/dL (ref 1.7–2.4)

## 2020-07-07 LAB — HEPATITIS B SURFACE ANTIGEN: Hepatitis B Surface Ag: NONREACTIVE

## 2020-07-07 LAB — HIV ANTIBODY (ROUTINE TESTING W REFLEX): HIV Screen 4th Generation wRfx: NONREACTIVE

## 2020-07-07 LAB — GLUCOSE, CAPILLARY
Glucose-Capillary: 122 mg/dL — ABNORMAL HIGH (ref 70–99)
Glucose-Capillary: 115 mg/dL — ABNORMAL HIGH (ref 70–99)
Glucose-Capillary: 127 mg/dL — ABNORMAL HIGH (ref 70–99)

## 2020-07-07 MED ORDER — MORPHINE SULFATE (PF) 2 MG/ML IV SOLN
2.0000 mg | INTRAVENOUS | Status: DC | PRN
  Administered 2020-07-07 – 2020-07-09 (×8): 2 mg via INTRAVENOUS
  Filled 2020-07-07 (×9): qty 1

## 2020-07-07 MED ORDER — SEVELAMER CARBONATE 800 MG PO TABS
800.0000 mg | ORAL_TABLET | Freq: Three times a day (TID) | ORAL | Status: DC
  Administered 2020-07-07 – 2020-07-12 (×14): 800 mg via ORAL
  Filled 2020-07-07 (×15): qty 1

## 2020-07-07 MED ORDER — GABAPENTIN 100 MG PO CAPS
100.0000 mg | ORAL_CAPSULE | Freq: Every day | ORAL | Status: DC
  Administered 2020-07-07 – 2020-07-11 (×5): 100 mg via ORAL
  Filled 2020-07-07 (×5): qty 1

## 2020-07-07 MED ORDER — COLCHICINE 0.6 MG PO TABS
0.6000 mg | ORAL_TABLET | Freq: Once | ORAL | Status: AC
  Administered 2020-07-07: 0.6 mg via ORAL
  Filled 2020-07-07: qty 1

## 2020-07-07 MED ORDER — ONDANSETRON HCL 4 MG/2ML IJ SOLN
4.0000 mg | Freq: Four times a day (QID) | INTRAMUSCULAR | Status: DC | PRN
  Administered 2020-07-07 – 2020-07-10 (×4): 4 mg via INTRAVENOUS
  Filled 2020-07-07 (×5): qty 2

## 2020-07-07 NOTE — Progress Notes (Signed)
Pt refused to wear CPAP machine

## 2020-07-07 NOTE — Progress Notes (Signed)
Pt transported to CT scan by this RN. During CT, RUE PIV infiltrated. Upon return to unit, ice pack applied. Will continue to monitor.

## 2020-07-07 NOTE — Progress Notes (Signed)
  Echocardiogram 2D Echocardiogram has been performed.  Martha [name] 07/07/2020, 8:55 AM

## 2020-07-07 NOTE — Progress Notes (Signed)
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  Patient was having left arm pain for the last week, she had to shorten her hemodialysis sessions last 2 times.  Also complaining of shortness of breath.  In the emergency department she was febrile.  Patient was 87% on room air.  In the ER with leukocytosis, CT scan abdomen pelvis with new bibasilar airspace disease concerning for pneumonia and mild left-sided collecting system dilatation with asymmetrical fatty stranding about the left ureter with concern for ascending UTI.  Patient occasionally makes urine.  She was started on treatment with antibiotics for pneumonia and admitted to the hospital. Blood cultures growing MRSA bacteremia.   Assessment & Plan:   Principal Problem:   CAP (community acquired pneumonia) Active Problems:   Morbid obesity ([place])   Essential hypertension   GERD   Sleep apnea   Type 2 diabetes mellitus treated with insulin (HCC)   Hyperglycemia due to diabetes mellitus ([place])   Bipolar 1 disorder (HCC)   ESRD (end stage renal disease) on dialysis ([place])   UTI (urinary tract infection)   Hypoalbuminemia   Acute respiratory failure with hypoxia (HCC)  MRSA bacteremia , currently acquired pneumonia suspected MRSA pneumonia with acute hypoxemic respiratory failure:  Discontinue Rocephin and azithromycin. Continue vancomycin, dosed by pharmacy with levels. Blood cultures 12/9 with MRSA Blood cultures 12/11 drawn 2D echocardiogram, results pending Painful left wrist and forearm, CT scan with contrast with no evidence of myositis or osteomyelitis. Will check vascular ultrasound of the left upper extremity for AV  fistula. Followed by ID.  Presumed UTI: Ruled out.  Hyperglycemia secondary to type 2 diabetes: On insulin that is continued.  ESRD on hemodialysis: On a schedule hemodialysis.  Using left arm fistula.  Left forearm pain and swelling: No evidence of ischemia, vascularity is intact.  No local erythema but diffuse swelling.  Denies trauma.  CT scan with contrast with no evidence of myositis, abscess or bony abnormalities.  If continues to have pain, may need angiogram/fistula evaluation, will discuss with nephrology.  Sleep apnea: Using CPAP.   DVT prophylaxis: heparin injection 5,000 Units Start: 07/05/20 2330 SCDs Start: 07/05/20 2316   Code Status: Full code Family Communication: Patient's daughter on the phone Disposition Plan: Status is: Inpatient  Remains inpatient appropriate because:Inpatient level of care appropriate due to severity of illness   Dispo: The patient is from: Home              Anticipated d/c is to: Home              Anticipated d/c date is: 3 days              Patient currently is not medically stable to d/c.         Consultants:   Nephrology  Procedures:   Hemodialysis  Antimicrobials:  Anti-infectives (From admission, onward)   Start     Dose/Rate Route Frequency Ordered Stop   07/09/20 1200  vancomycin (VANCOCIN) IVPB 1000 mg/200 mL premix        1,000 mg 200 mL/hr over 60 Minutes Intravenous Every M-W-F (Hemodialysis) 07/06/20 1129     07/06/20 2200  cefTRIAXone (ROCEPHIN) 1 g in  sodium chloride 0.9 % 100 mL IVPB  Status:  Discontinued        1 g 200 mL/hr over 30 Minutes Intravenous Every 24 hours 07/06/20 0109 07/06/20 1041   07/06/20 2200  azithromycin (ZITHROMAX) 500 mg in sodium chloride 0.9 % 250 mL IVPB  Status:  Discontinued        500 mg 250 mL/hr over 60 Minutes Intravenous Every 24 hours 07/06/20 0109 07/07/20 0938   07/06/20 2000  vancomycin (VANCOCIN) IVPB 1000 mg/200 mL premix        1,000 mg 200 mL/hr over 60  Minutes Intravenous  Once 07/06/20 1413 07/06/20 2025   07/06/20 1800  vancomycin (VANCOREADY) IVPB 2000 mg/400 mL  Status:  Discontinued        2,000 mg 200 mL/hr over 120 Minutes Intravenous  Once 07/06/20 1212 07/06/20 1340   07/06/20 1330  vancomycin (VANCOREADY) IVPB 2000 mg/400 mL        2,000 mg 200 mL/hr over 120 Minutes Intravenous  Once 07/06/20 1340 07/06/20 1437   07/06/20 1100  vancomycin (VANCOREADY) IVPB 2000 mg/400 mL  Status:  Discontinued        2,000 mg 200 mL/hr over 120 Minutes Intravenous  Once 07/06/20 1035 07/06/20 1212   07/05/20 2130  cefTRIAXone (ROCEPHIN) 1 g in sodium chloride 0.9 % 100 mL IVPB        1 g 200 mL/hr over 30 Minutes Intravenous  Once 07/05/20 2120 07/06/20 0005   07/05/20 2130  azithromycin (ZITHROMAX) tablet 500 mg        500 mg Oral  Once 07/05/20 2120 07/05/20 2336         Subjective: Patient seen and examined.  No overnight events.  Continues to have pain on her left wrist and difficult to mobilize.  Oral pain medications not helping.  She wants to try some IV pain medicine.  Remains afebrile.  Also had some shortness of breath mostly when she has bad posture.  Objective: Vitals:   07/07/20 0830 07/07/20 0900 07/07/20 1000 07/07/20 1100  BP:   (!) 161/105   Pulse: 80 78 73 73  Resp: 17 (!) 21 (!) 23 17  Temp:      TempSrc:      SpO2: 96% 93% 94% 95%  Weight:      Height:        Intake/Output Summary (Last 24 hours) at 07/07/2020 1143 Last data filed at 07/07/2020 7858 Gross per 24 hour  Intake 294.19 ml  Output 2612 ml  Net -2317.81 ml   Filed Weights   07/05/20 1133 07/06/20 1620 07/06/20 2045  Weight: (!) 142.9 kg (!) 144.6 kg (!) 141.4 kg    Examination:  General exam: Chronically sick looking.  In mild distress and anxious. She is legally blind.  Does not see waving of hands. Respiratory system: Clear to auscultation. Respiratory effort normal.  Mostly conducted airway sounds.  Difficult to  auscultate. Cardiovascular system: S1 & S2 heard, RRR.  Gastrointestinal system: Obese and pendulous.  Nontender.  Bowel sounds present. Central nervous system: Alert and oriented. No focal neurological deficits. Extremities: Symmetric 5 x 5 power. Left upper extremity AV fistula present with thrill.  Nontender at the fistula. Tenderness with no swelling erythema, mild swelling on the dorsum of the forearm.   Data Reviewed: I have personally reviewed following labs and imaging studies  CBC: Recent Labs  Lab 07/02/20 1241 07/05/20 1813 07/06/20 0500 07/07/20 0334  WBC 7.4 11.9* 11.2* 10.8*  NEUTROABS  5.2 9.7*  --  8.7*  HGB 11.1* 11.1* 10.7* 9.7*  HCT 35.2* 36.4 35.2* 31.5*  MCV 95.7 96.8 95.9 96.3  PLT 108* 225 234 620   Basic Metabolic Panel: Recent Labs  Lab 07/02/20 1021 07/05/20 1813 07/06/20 0500 07/07/20 0334  NA 133* 132* 134* 132*  K 4.5 4.6 4.6 3.8  CL 94* 91* 92* 91*  CO2 21* 22 23 28   GLUCOSE 168* 158* 130* 167*  BUN 65* 57* 68* 35*  CREATININE 12.85* 12.35* 13.55* 7.46*  CALCIUM 8.5* 8.3* 8.8* 8.5*  MG  --   --  1.9 1.8  PHOS  --   --  6.5*  --    GFR: Estimated Creatinine Clearance: 13 mL/min (A) (by C-G formula based on SCr of 7.46 mg/dL (H)). Liver Function Tests: Recent Labs  Lab 07/05/20 1813 07/06/20 0500  AST 29 16  ALT 38 26  ALKPHOS 146* 130*  BILITOT 1.6* 0.8  PROT 8.1 7.7  ALBUMIN 3.3* 3.0*   Recent Labs  Lab 07/05/20 1813  LIPASE 23   No results for input(s): AMMONIA in the last 168 hours. Coagulation Profile: Recent Labs  Lab 07/05/20 1813 07/06/20 0500  INR 1.3* 1.3*   Cardiac Enzymes: No results for input(s): CKTOTAL, CKMB, CKMBINDEX, TROPONINI in the last 168 hours. BNP (last 3 results) No results for input(s): PROBNP in the last 8760 hours. HbA1C: Recent Labs    07/05/20 1813  HGBA1C 6.0*   CBG: Recent Labs  Lab 07/06/20 0857 07/06/20 1154 07/06/20 1606 07/06/20 2213 07/07/20 1105  GLUCAP 136* 152*  147* 201* 122*   Lipid Profile: No results for input(s): CHOL, HDL, LDLCALC, TRIG, CHOLHDL, LDLDIRECT in the last 72 hours. Thyroid Function Tests: No results for input(s): TSH, T4TOTAL, FREET4, T3FREE, THYROIDAB in the last 72 hours. Anemia Panel: No results for input(s): VITAMINB12, FOLATE, FERRITIN, TIBC, IRON, RETICCTPCT in the last 72 hours. Sepsis Labs: Recent Labs  Lab 07/05/20 1813 07/05/20 2009  LATICACIDVEN 1.6 0.9    Recent Results (from the past 240 hour(s))  Resp Panel by RT-PCR (Flu A&B, Covid) Nasopharyngeal Swab     Status: None   Collection Time: 07/02/20 10:05 AM   Specimen: Nasopharyngeal Swab; Nasopharyngeal(NP) swabs in vial transport medium  Result Value Ref Range Status   SARS Coronavirus 2 by RT PCR NEGATIVE NEGATIVE Final    Comment: (NOTE) SARS-CoV-2 target nucleic acids are NOT DETECTED.  The SARS-CoV-2 RNA is generally detectable in upper respiratory specimens during the acute phase of infection. The lowest concentration of SARS-CoV-2 viral copies this assay can detect is 138 copies/mL. A negative result does not preclude SARS-Cov-2 infection and should not be used as the sole basis for treatment or other patient management decisions. A negative result may occur with  improper specimen collection/handling, submission of specimen other than nasopharyngeal swab, presence of viral mutation(s) within the areas targeted by this assay, and inadequate number of viral copies(<138 copies/mL). A negative result must be combined with clinical observations, patient history, and epidemiological information. The expected result is Negative.  Fact Sheet for Patients:  EntrepreneurPulse.com.au  Fact Sheet for Healthcare Providers:  IncredibleEmployment.be  This test is no t yet approved or cleared by the [place] FDA and  has been authorized for detection and/or diagnosis of SARS-CoV-2 by FDA under an Emergency Use  Authorization (EUA). This EUA will remain  in effect (meaning this test can be used) for the duration of the COVID-19 declaration under Section 564(b)(1) of the Act,  21 U.S.C.section 360bbb-3(b)(1), unless the authorization is terminated  or revoked sooner.       Influenza A by PCR NEGATIVE NEGATIVE Final   Influenza B by PCR NEGATIVE NEGATIVE Final    Comment: (NOTE) The Xpert Xpress SARS-CoV-2/FLU/RSV plus assay is intended as an aid in the diagnosis of influenza from Nasopharyngeal swab specimens and should not be used as a sole basis for treatment. Nasal washings and aspirates are unacceptable for Xpert Xpress SARS-CoV-2/FLU/RSV testing.  Fact Sheet for Patients: EntrepreneurPulse.com.au  Fact Sheet for Healthcare Providers: IncredibleEmployment.be  This test is not yet approved or cleared by the [place] FDA and has been authorized for detection and/or diagnosis of SARS-CoV-2 by FDA under an Emergency Use Authorization (EUA). This EUA will remain in effect (meaning this test can be used) for the duration of the COVID-19 declaration under Section 564(b)(1) of the Act, 21 U.S.C. section 360bbb-3(b)(1), unless the authorization is terminated or revoked.  Performed at [facility], [address] [place], [place] [address]   Culture, blood (single)     Status: Abnormal (Preliminary result)   Collection Time: 07/05/20  6:11 PM   Specimen: BLOOD  Result Value Ref Range Status   Specimen Description   Final    BLOOD BLOOD RIGHT WRIST Performed at [facility], [address] [place], [place] [address]    Special Requests   Final    BOTTLES DRAWN AEROBIC AND ANAEROBIC Blood Culture results may not be optimal due to an inadequate volume of blood received in culture bottles Performed at [facility], [address], [place], [place] [address]    Culture  Setup Time   Final    GRAM POSITIVE COCCI ANAEROBIC AND AEROBIC BOTTLES  Gram  Stain Report Called to,Read Back By and Verified With: LOOMIS,K@1150  BY MATTHEWS,B12.10.21 CRITICAL VALUE NOTED.  VALUE IS CONSISTENT WITH PREVIOUSLY REPORTED AND CALLED VALUE. Performed at [facility], [place] [address], [place], [place] [address]    Culture STAPHYLOCOCCUS AUREUS (A)  Final   Report Status PENDING  Incomplete  Blood culture (routine single)     Status: Abnormal (Preliminary result)   Collection Time: 07/05/20  6:13 PM   Specimen: BLOOD RIGHT WRIST  Result Value Ref Range Status   Specimen Description   Final    BLOOD RIGHT WRIST Performed at [facility], [address], [place], [place] [address]    Special Requests   Final    BOTTLES DRAWN AEROBIC AND ANAEROBIC Blood Culture results may not be optimal due to an inadequate volume of blood received in culture bottles Performed at [place] [place], [place] [address]    Culture  Setup Time   Final    GRAM POSITIVE COCCI BOTH ANAEROBIC AND AEROBIC BOTTLES Gram Stain Report Called to,Read Back By and Verified With: PARNELL,R @0945  BY MATTHEWS B 12.10.21 CRITICAL RESULT CALLED TO, READ BACK BY AND VERIFIED WITH: PHARMD L POOLE [phone] FCP    Culture (A)  Final    STAPHYLOCOCCUS AUREUS SUSCEPTIBILITIES TO FOLLOW Performed at [facility], [place] [address], [place], [place] [address]    Report Status PENDING  Incomplete  Blood Culture ID Panel (Reflexed)     Status: Abnormal   Collection Time: 07/05/20  6:13 PM  Result Value Ref Range Status   Enterococcus faecalis NOT DETECTED NOT DETECTED Final   Enterococcus Faecium NOT DETECTED NOT DETECTED Final   Listeria monocytogenes NOT DETECTED NOT DETECTED Final   Staphylococcus species  DETECTED (A) NOT DETECTED Final    Comment: CRITICAL RESULT CALLED TO, READ BACK BY AND VERIFIED WITH: PHARMD L POOLE [phone] FCP    Staphylococcus aureus (BCID) DETECTED (A) NOT DETECTED Final    Comment: Methicillin (oxacillin)-resistant  Staphylococcus aureus (MRSA). MRSA is predictably resistant to beta-lactam antibiotics (except ceftaroline). Preferred therapy is vancomycin unless clinically contraindicated. Patient requires contact precautions if  hospitalized. CRITICAL RESULT CALLED TO, READ BACK BY AND VERIFIED WITH: PHARMD L POOLE [phone] FCP    Staphylococcus epidermidis NOT DETECTED NOT DETECTED Final   Staphylococcus lugdunensis NOT DETECTED NOT DETECTED Final   Streptococcus species NOT DETECTED NOT DETECTED Final   Streptococcus agalactiae NOT DETECTED NOT DETECTED Final   Streptococcus pneumoniae NOT DETECTED NOT DETECTED Final   Streptococcus pyogenes NOT DETECTED NOT DETECTED Final   A.calcoaceticus-baumannii NOT DETECTED NOT DETECTED Final   Bacteroides fragilis NOT DETECTED NOT DETECTED Final   Enterobacterales NOT DETECTED NOT DETECTED Final   Enterobacter cloacae complex NOT DETECTED NOT DETECTED Final   Escherichia coli NOT DETECTED NOT DETECTED Final   Klebsiella aerogenes NOT DETECTED NOT DETECTED Final   Klebsiella oxytoca NOT DETECTED NOT DETECTED Final   Klebsiella pneumoniae NOT DETECTED NOT DETECTED Final   Proteus species NOT DETECTED NOT DETECTED Final   Salmonella species NOT DETECTED NOT DETECTED Final   Serratia marcescens NOT DETECTED NOT DETECTED Final   Haemophilus influenzae NOT DETECTED NOT DETECTED Final   Neisseria meningitidis NOT DETECTED NOT DETECTED Final   Pseudomonas aeruginosa NOT DETECTED NOT DETECTED Final   Stenotrophomonas maltophilia NOT DETECTED NOT DETECTED Final   Candida albicans NOT DETECTED NOT DETECTED Final   Candida auris NOT DETECTED NOT DETECTED Final   Candida glabrata NOT DETECTED NOT DETECTED Final   Candida krusei NOT DETECTED NOT DETECTED Final   Candida parapsilosis NOT DETECTED NOT DETECTED Final   Candida tropicalis NOT DETECTED NOT DETECTED Final   Cryptococcus neoformans/gattii NOT DETECTED NOT DETECTED Final   Meth resistant mecA/C and MREJ  DETECTED (A) NOT DETECTED Final    Comment: CRITICAL RESULT CALLED TO, READ BACK BY AND VERIFIED WITH: PHARMD L POOLE [phone] FCP Performed at [facility], [address], [place], [place] [address]   Resp Panel by RT-PCR (Flu A&B, Covid) Nasopharyngeal Swab     Status: None   Collection Time: 07/05/20  9:49 PM   Specimen: Nasopharyngeal Swab; Nasopharyngeal(NP) swabs in vial transport medium  Result Value Ref Range Status   SARS Coronavirus 2 by RT PCR NEGATIVE NEGATIVE Final    Comment: (NOTE) SARS-CoV-2 target nucleic acids are NOT DETECTED.  The SARS-CoV-2 RNA is generally detectable in upper respiratory specimens during the acute phase of infection. The lowest concentration of SARS-CoV-2 viral copies this assay can detect is 138 copies/mL. A negative result does not preclude SARS-Cov-2 infection and should not be used as the sole basis for treatment or other patient management decisions. A negative result may occur with  improper specimen collection/handling, submission of specimen other than nasopharyngeal swab, presence of viral mutation(s) within the areas targeted by this assay, and inadequate number of viral copies(<138 copies/mL). A negative result must be combined with clinical observations, patient history, and epidemiological information. The expected result is Negative.  Fact Sheet for Patients:  EntrepreneurPulse.com.au  Fact Sheet for Healthcare Providers:  IncredibleEmployment.be  This test is no t yet approved or cleared by the [place] FDA and  has been authorized for detection and/or diagnosis of SARS-CoV-2 by FDA  under an Emergency Use Authorization (EUA). This EUA will remain  in effect (meaning this test can be used) for the duration of the COVID-19 declaration under Section 564(b)(1) of the Act, 21 U.S.C.section 360bbb-3(b)(1), unless the authorization is terminated  or revoked sooner.        Influenza A by PCR NEGATIVE NEGATIVE Final   Influenza B by PCR NEGATIVE NEGATIVE Final    Comment: (NOTE) The Xpert Xpress SARS-CoV-2/FLU/RSV plus assay is intended as an aid in the diagnosis of influenza from Nasopharyngeal swab specimens and should not be used as a sole basis for treatment. Nasal washings and aspirates are unacceptable for Xpert Xpress SARS-CoV-2/FLU/RSV testing.  Fact Sheet for Patients: EntrepreneurPulse.com.au  Fact Sheet for Healthcare Providers: IncredibleEmployment.be  This test is not yet approved or cleared by the [place] FDA and has been authorized for detection and/or diagnosis of SARS-CoV-2 by FDA under an Emergency Use Authorization (EUA). This EUA will remain in effect (meaning this test can be used) for the duration of the COVID-19 declaration under Section 564(b)(1) of the Act, 21 U.S.C. section 360bbb-3(b)(1), unless the authorization is terminated or revoked.  Performed at [facility], [address], [place], [place] [address]   MRSA PCR Screening     Status: Abnormal   Collection Time: 07/06/20 11:31 AM   Specimen: Nasal Mucosa; Nasopharyngeal  Result Value Ref Range Status   MRSA by PCR POSITIVE (A) NEGATIVE Final    Comment:        The GeneXpert MRSA Assay (FDA approved for NASAL specimens only), is one component of a comprehensive MRSA colonization surveillance program. It is not intended to diagnose MRSA infection nor to guide or monitor treatment for MRSA infections. RESULT CALLED TO, READ BACK BY AND VERIFIED WITH: LOOMIS K. @ 0102 ON 121021 BY HENDERSON L. Performed at [facility], [address], [place], [place] [address]          Radiology Studies: CT Abdomen Pelvis W Contrast  Result Date: 07/05/2020 CLINICAL DATA:  Abdominal pain. EXAM: CT ABDOMEN AND PELVIS WITH CONTRAST TECHNIQUE: Multidetector CT imaging of the abdomen and pelvis was performed using the standard protocol  following bolus administration of intravenous contrast. CONTRAST:  151mL OMNIPAQUE IOHEXOL 300 MG/ML  SOLN COMPARISON:  CT dated July 04, 2020 FINDINGS: Lower chest: There is new bibasilar airspace disease concerning for pneumonia.The heart is enlarged. Hepatobiliary: The liver is normal. Status post cholecystectomy.There is no biliary ductal dilation. Pancreas: Normal contours without ductal dilatation. No peripancreatic fluid collection. Spleen: The spleen is enlarged. 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 Adrenals/Urinary Tract: --Adrenal glands: Unremarkable. --Right kidney/ureter: The right kidney is atrophic without evidence for hydronephrosis. --Left kidney/ureter: The left kidney is atrophic. There is mild left-sided collecting system dilatation with asymmetric fat stranding about the left ureter. --Urinary bladder: Unremarkable. Stomach/Bowel: --Stomach/Duodenum: No hiatal hernia or other gastric abnormality. Normal duodenal course and caliber. --Small bowel: Unremarkable. --Colon: Unremarkable. --Appendix: Normal. Vascular/Lymphatic: Atherosclerotic calcification is present within the non-aneurysmal abdominal aorta, without hemodynamically significant stenosis. --No retroperitoneal lymphadenopathy. --No mesenteric lymphadenopathy. --No pelvic or inguinal lymphadenopathy. Reproductive: Unremarkable Other: No ascites or free air. The abdominal wall is normal. Musculoskeletal. No acute displaced fractures. IMPRESSION: 1. New bibasilar airspace disease concerning for pneumonia. 2. Mild left-sided collecting system dilatation with asymmetric fat stranding about the left ureter. Correlation with urinalysis is recommended as findings may represent an ascending urinary tract infection. 3. 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 Aortic Atherosclerosis (ICD10-I70.0). Electronically Signed   By:  Constance Holster M.D.   On: 07/05/2020 21:15   CT WRIST LEFT W CONTRAST  Result Date: 07/07/2020 CLINICAL DATA:  Left forearm pain tendons.  MRSA bacteremia. EXAM: CT OF THE UPPER LEFT EXTREMITY WITH CONTRAST TECHNIQUE: Multidetector CT imaging of the upper left extremity was performed according to the standard protocol following intravenous contrast administration. CONTRAST:  41mL OMNIPAQUE IOHEXOL 300 MG/ML  SOLN COMPARISON:  Radiographs 07/05/2020 FINDINGS: Very limited examination due to positioning artifact patient's adjacent body. The bony structures are grossly intact. No obvious destructive bony changes to suggest septic arthritis or osteomyelitis. No obvious subcutaneous abscess or findings for pyomyositis. IMPRESSION: 1. Very limited examination. 2. No obvious destructive bony changes to suggest septic arthritis or osteomyelitis. 3. No obvious subcutaneous abscess or findings for pyomyositis. Electronically Signed   By: Marijo Sanes M.D.   On: 07/07/2020 10:52   DG Chest Port 1 View  Result Date: 07/05/2020 CLINICAL DATA:  Questionable sepsis - evaluate for abnormality EXAM: PORTABLE CHEST 1 VIEW COMPARISON:  07/02/2020 and prior FINDINGS: No focal consolidation, pneumothorax or pleural effusion. Mild cardiomegaly, unchanged. No acute osseous abnormality. IMPRESSION: No focal airspace disease. Unchanged mild cardiomegaly. Electronically Signed   By: Primitivo Gauze M.D.   On: 07/05/2020 19:02        Scheduled Meds:  atorvastatin  20 mg Oral Daily   Chlorhexidine Gluconate Cloth  6 each Topical Q0600   dextromethorphan-guaiFENesin  1 tablet Oral BID   feeding supplement (GLUCERNA SHAKE)  237 mL Oral TID BM   gabapentin  100 mg Oral QHS   heparin  5,000 Units Subcutaneous Q8H   insulin aspart  0-5 Units Subcutaneous QHS   insulin aspart  0-6 Units Subcutaneous TID WC   mouth rinse  15 mL Mouth Rinse BID   mupirocin ointment  1 application Nasal BID   pantoprazole   40 mg Oral Daily   PARoxetine  20 mg Oral QPM   Continuous Infusions:  sodium chloride     sodium chloride     [START ON 07/09/2020] vancomycin       LOS: 2 days    Time spent: 30 minutes    Barb Merino, MD [facility] Pager [phone]

## 2020-07-07 NOTE — Progress Notes (Signed)
[facility] NEPHROLOGY PROGRESS NOTE  Assessment/ Plan: Pt is a 54 y.o. yo female with DM, obesity, COPD, CHF, ESRD on HD MWF via left upper extremity AV fistula [facility] presented with left hand pain  found to have MRSA bacteremia.  #Left upper extremity pain/swelling: At the site of AV fistula. The AV fistula site looks clean, no erythema or tenderness, no sign of infection.  She actually has pain towards wrist and distal hand.  The CT scan without evidence of bony abnormalities or pyomyositis.  Per dialysis nurse there was no problem with blood flow through the AV fistula.  Exact etiology unknown however, some concern for steal syndrome.  Further evaluation of steal syndrome, she will need fistulogram and vascular consult.  She had duplex study of AV fistula today, follow-up the result. May consider treatment for gout   #MRSA bacteremia with suspected source pneumonia, hypoxic respiratory failure: On vancomycin, echo per primary team.  # ESRD MWF at [place]: Status post dialysis yesterday with 2.6 L UF, tolerated well.  No problem with access.  Plan for next HD on 12/13.  # Anemia of CKD: Slight drift in hemoglobin today.  Monitor hemoglobin.  May need ESA.  No iron because of infection.  # Secondary hyperparathyroidism: Phosphorus level elevated therefore start sevelamer.  Need to obtain outpatient medication list if remains in the hospital long..  # HTN/volume: Blood pressure variable, UF during dialysis.  Also on pain.  Subjective: Seen and examined in ICU.  Reports left forearm pain mostly around wrist.  Denies nausea vomiting chest pain shortness of breath.  Tolerated dialysis well. Objective Vital signs in last 24 hours: Vitals:   07/07/20 0900 07/07/20 1000 07/07/20 1100 07/07/20 1200  BP:  (!) 161/105  (!) 163/61  Pulse: 78 73 73 76  Resp: (!) 21 (!) 23 17 19   Temp:      TempSrc:      SpO2: 93% 94% 95% 95%  Weight:      Height:        Weight change: 1.717 kg  Intake/Output Summary (Last 24 hours) at 07/07/2020 1423 Last data filed at 07/07/2020 0733 Gross per 24 hour  Intake 294.19 ml  Output 2612 ml  Net -2317.81 ml       Labs: Basic Metabolic Panel: Recent Labs  Lab 07/05/20 1813 07/06/20 0500 07/07/20 0334  NA 132* 134* 132*  K 4.6 4.6 3.8  CL 91* 92* 91*  CO2 22 23 28   GLUCOSE 158* 130* 167*  BUN 57* 68* 35*  CREATININE 12.35* 13.55* 7.46*  CALCIUM 8.3* 8.8* 8.5*  PHOS  --  6.5*  --    Liver Function Tests: Recent Labs  Lab 07/05/20 1813 07/06/20 0500  AST 29 16  ALT 38 26  ALKPHOS 146* 130*  BILITOT 1.6* 0.8  PROT 8.1 7.7  ALBUMIN 3.3* 3.0*   Recent Labs  Lab 07/05/20 1813  LIPASE 23   No results for input(s): AMMONIA in the last 168 hours. CBC: Recent Labs  Lab 07/02/20 1241 07/05/20 1813 07/06/20 0500 07/07/20 0334  WBC 7.4 11.9* 11.2* 10.8*  NEUTROABS 5.2 9.7*  --  8.7*  HGB 11.1* 11.1* 10.7* 9.7*  HCT 35.2* 36.4 35.2* 31.5*  MCV 95.7 96.8 95.9 96.3  PLT 108* 225 234 202   Cardiac Enzymes: No results for input(s): CKTOTAL, CKMB, CKMBINDEX, TROPONINI in the last 168 hours. CBG: Recent Labs  Lab 07/06/20 0857 07/06/20 1154 07/06/20 1606 07/06/20 2213 07/07/20 1105  GLUCAP  136* 152* 147* 201* 122*    Iron Studies: No results for input(s): IRON, TIBC, TRANSFERRIN, FERRITIN in the last 72 hours. Studies/Results: CT Abdomen Pelvis W Contrast  Result Date: 07/05/2020 CLINICAL DATA:  Abdominal pain. EXAM: CT ABDOMEN AND PELVIS WITH CONTRAST TECHNIQUE: Multidetector CT imaging of the abdomen and pelvis was performed using the standard protocol following bolus administration of intravenous contrast. CONTRAST:  166mL OMNIPAQUE IOHEXOL 300 MG/ML  SOLN COMPARISON:  CT dated July 04, 2020 FINDINGS: Lower chest: There is new bibasilar airspace disease concerning for pneumonia.The heart is enlarged. Hepatobiliary: The liver is normal. Status post cholecystectomy.There  is no biliary ductal dilation. Pancreas: Normal contours without ductal dilatation. No peripancreatic fluid collection. Spleen: The spleen is enlarged. 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 Adrenals/Urinary Tract: --Adrenal glands: Unremarkable. --Right kidney/ureter: The right kidney is atrophic without evidence for hydronephrosis. --Left kidney/ureter: The left kidney is atrophic. There is mild left-sided collecting system dilatation with asymmetric fat stranding about the left ureter. --Urinary bladder: Unremarkable. Stomach/Bowel: --Stomach/Duodenum: No hiatal hernia or other gastric abnormality. Normal duodenal course and caliber. --Small bowel: Unremarkable. --Colon: Unremarkable. --Appendix: Normal. Vascular/Lymphatic: Atherosclerotic calcification is present within the non-aneurysmal abdominal aorta, without hemodynamically significant stenosis. --No retroperitoneal lymphadenopathy. --No mesenteric lymphadenopathy. --No pelvic or inguinal lymphadenopathy. Reproductive: Unremarkable Other: No ascites or free air. The abdominal wall is normal. Musculoskeletal. No acute displaced fractures. IMPRESSION: 1. New bibasilar airspace disease concerning for pneumonia. 2. Mild left-sided collecting system dilatation with asymmetric fat stranding about the left ureter. Correlation with urinalysis is recommended as findings may represent an ascending urinary tract infection. 3. 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 Aortic Atherosclerosis (ICD10-I70.0). Electronically Signed   By: Constance Holster M.D.   On: 07/05/2020 21:15   CT WRIST LEFT W CONTRAST  Result Date: 07/07/2020 CLINICAL DATA:  Left forearm pain tendons.  MRSA bacteremia. EXAM: CT OF THE UPPER LEFT EXTREMITY WITH CONTRAST TECHNIQUE: Multidetector CT imaging of the upper left extremity was performed according to the standard  protocol following intravenous contrast administration. CONTRAST:  46mL OMNIPAQUE IOHEXOL 300 MG/ML  SOLN COMPARISON:  Radiographs 07/05/2020 FINDINGS: Very limited examination due to positioning artifact patient's adjacent body. The bony structures are grossly intact. No obvious destructive bony changes to suggest septic arthritis or osteomyelitis. No obvious subcutaneous abscess or findings for pyomyositis. IMPRESSION: 1. Very limited examination. 2. No obvious destructive bony changes to suggest septic arthritis or osteomyelitis. 3. No obvious subcutaneous abscess or findings for pyomyositis. Electronically Signed   By: Marijo Sanes M.D.   On: 07/07/2020 10:52   DG Chest Port 1 View  Result Date: 07/05/2020 CLINICAL DATA:  Questionable sepsis - evaluate for abnormality EXAM: PORTABLE CHEST 1 VIEW COMPARISON:  07/02/2020 and prior FINDINGS: No focal consolidation, pneumothorax or pleural effusion. Mild cardiomegaly, unchanged. No acute osseous abnormality. IMPRESSION: No focal airspace disease. Unchanged mild cardiomegaly. Electronically Signed   By: Primitivo Gauze M.D.   On: 07/05/2020 19:02   ECHOCARDIOGRAM COMPLETE  Result Date: 07/07/2020    ECHOCARDIOGRAM REPORT   Patient Name:   Martha [name] Date of Exam: 07/07/2020 Medical Rec #:  [id number]     Height:       67.0 in Accession #:    [id number]    Weight:       311.7 lb Date of Birth:  January 08, 1966     BSA:  2.442 m Patient Age:    27 years      BP:           161/56 mmHg Patient Gender: F             HR:           78 bpm. Exam Location:  [facility] Procedure: 2D Echo, Cardiac Doppler and Color Doppler Indications:    Bacteremia  History:        Patient has prior history of Echocardiogram examinations, most                 recent 04/07/2017. COPD, Signs/Symptoms:Shortness of Breath and                 Bacteremia; Risk Factors:Hypertension, Diabetes and Obesity.                 ESRD, MSSA, pneumonia, resp. failure,.  Sonographer:    Dustin Flock RDCS Referring Phys: [id number] [place]  1. Left ventricular ejection fraction, by estimation, is 55 to 60%. The left ventricle has normal function. The left ventricle has no regional wall motion abnormalities. There is mild concentric left ventricular hypertrophy. Left ventricular diastolic parameters are consistent with Grade I diastolic dysfunction (impaired relaxation). Elevated left atrial pressure.  2. Right ventricular systolic function is normal. The right ventricular size is normal.  3. Left atrial size was mildly dilated.  4. The mitral valve is normal in structure. Mild mitral valve regurgitation. No evidence of mitral stenosis.  5. The aortic valve is normal in structure. Aortic valve regurgitation is not visualized. No aortic stenosis is present.  6. The inferior vena cava is normal in size with greater than 50% respiratory variability, suggesting right atrial pressure of 3 mmHg. Conclusion(s)/Recommendation(s): No evidence of valvular vegetations on this transthoracic echocardiogram. Would recommend a transesophageal echocardiogram to exclude infective endocarditis if clinically indicated. FINDINGS  Left Ventricle: Left ventricular ejection fraction, by estimation, is 55 to 60%. The left ventricle has normal function. The left ventricle has no regional wall motion abnormalities. The left ventricular internal cavity size was normal in size. There is  mild concentric left ventricular hypertrophy. Left ventricular diastolic parameters are consistent with Grade I diastolic dysfunction (impaired relaxation). Elevated left atrial pressure. Right Ventricle: The right ventricular size is normal. No increase in right ventricular wall thickness. Right ventricular systolic function is normal. Left Atrium: Left atrial size was mildly dilated. Right Atrium: Right atrial size was normal in size. Pericardium: There is no evidence of pericardial effusion. Mitral Valve: The mitral valve is  normal in structure. Mild mitral valve regurgitation. No evidence of mitral valve stenosis. There is no evidence of mitral valve vegetation. Tricuspid Valve: The tricuspid valve is normal in structure. Tricuspid valve regurgitation is mild . No evidence of tricuspid stenosis. There is no evidence of tricuspid valve vegetation. Aortic Valve: The aortic valve is normal in structure. Aortic valve regurgitation is not visualized. No aortic stenosis is present. There is no evidence of aortic valve vegetation. Pulmonic Valve: The pulmonic valve was normal in structure. Pulmonic valve regurgitation is not visualized. No evidence of pulmonic stenosis. Aorta: The aortic root is normal in size and structure. Venous: The inferior vena cava is normal in size with greater than 50% respiratory variability, suggesting right atrial pressure of 3 mmHg. IAS/Shunts: No atrial level shunt detected by color flow Doppler.  LEFT VENTRICLE PLAX 2D LVIDd:         4.71 cm Diastology  LVIDs:         2.56 cm LV e' medial:    4.46 cm/s LV PW:         1.12 cm LV E/e' medial:  27.4 LV IVS:        1.29 cm LV e' lateral:   7.94 cm/s                        LV E/e' lateral: 15.4  RIGHT VENTRICLE RV Basal diam:  3.45 cm RV S prime:     10.90 cm/s TAPSE (M-mode): 2.1 cm LEFT ATRIUM           Index       RIGHT ATRIUM           Index LA diam:      3.80 cm 1.56 cm/m  RA Area:     14.90 cm LA Vol (A2C): 35.4 ml 14.50 ml/m RA Volume:   39.90 ml  16.34 ml/m LA Vol (A4C): 71.1 ml 29.11 ml/m  AORTIC VALVE LVOT Vmax:   143.00 cm/s LVOT Vmean:  88.800 cm/s LVOT VTI:    0.258 m  AORTA Ao Root diam: 2.80 cm MITRAL VALVE MV Area (PHT): 4.10 cm     SHUNTS MV Decel Time: 185 msec     Systemic VTI: 0.26 m MV E velocity: 122.00 cm/s MV A velocity: 95.10 cm/s MV E/A ratio:  1.28 Ena Dawley MD Electronically signed by Ena Dawley MD Signature Date/Time: 07/07/2020/1:14:43 PM    Final     Medications: Infusions: . sodium chloride    . sodium chloride     . [START ON 07/09/2020] vancomycin      Scheduled Medications: . atorvastatin  20 mg Oral Daily  . Chlorhexidine Gluconate Cloth  6 each Topical Q0600  . dextromethorphan-guaiFENesin  1 tablet Oral BID  . feeding supplement (GLUCERNA SHAKE)  237 mL Oral TID BM  . gabapentin  100 mg Oral QHS  . heparin  5,000 Units Subcutaneous Q8H  . insulin aspart  0-5 Units Subcutaneous QHS  . insulin aspart  0-6 Units Subcutaneous TID WC  . mouth rinse  15 mL Mouth Rinse BID  . mupirocin ointment  1 application Nasal BID  . pantoprazole  40 mg Oral Daily  . PARoxetine  20 mg Oral QPM    have reviewed scheduled and prn medications.  Physical Exam: General:NAD, comfortable Heart:RRR, s1s2 nl Lungs:clear b/l, no crackle Abdomen:soft, Non-tender, non-distended Extremities:No edema Dialysis Access: AV fistula has good thrill and bruit, no erythema, no tenderness.  Baby Gieger Tanna Furry 07/07/2020,2:23 PM  LOS: 2 days  Pager: [phone]

## 2020-07-08 LAB — CBC WITH DIFFERENTIAL/PLATELET
Abs Immature Granulocytes: 0.09 10*3/uL — ABNORMAL HIGH (ref 0.00–0.07)
Basophils Absolute: 0 10*3/uL (ref 0.0–0.1)
Basophils Relative: 0 %
Eosinophils Absolute: 0.1 10*3/uL (ref 0.0–0.5)
Eosinophils Relative: 1 %
HCT: 32.8 % — ABNORMAL LOW (ref 36.0–46.0)
Hemoglobin: 9.8 g/dL — ABNORMAL LOW (ref 12.0–15.0)
Immature Granulocytes: 1 %
Lymphocytes Relative: 9 %
Lymphs Abs: 1 10*3/uL (ref 0.7–4.0)
MCH: 29.3 pg (ref 26.0–34.0)
MCHC: 29.9 g/dL — ABNORMAL LOW (ref 30.0–36.0)
MCV: 98.2 fL (ref 80.0–100.0)
Monocytes Absolute: 0.8 10*3/uL (ref 0.1–1.0)
Monocytes Relative: 7 %
Neutro Abs: 8.5 10*3/uL — ABNORMAL HIGH (ref 1.7–7.7)
Neutrophils Relative %: 82 %
Platelets: 199 10*3/uL (ref 150–400)
RBC: 3.34 MIL/uL — ABNORMAL LOW (ref 3.87–5.11)
RDW: 17.2 % — ABNORMAL HIGH (ref 11.5–15.5)
WBC: 10.5 10*3/uL (ref 4.0–10.5)
nRBC: 0 % (ref 0.0–0.2)

## 2020-07-08 LAB — BASIC METABOLIC PANEL
Anion gap: 15 (ref 5–15)
BUN: 55 mg/dL — ABNORMAL HIGH (ref 6–20)
CO2: 26 mmol/L (ref 22–32)
Calcium: 8.5 mg/dL — ABNORMAL LOW (ref 8.9–10.3)
Chloride: 91 mmol/L — ABNORMAL LOW (ref 98–111)
Creatinine, Ser: 10.15 mg/dL — ABNORMAL HIGH (ref 0.44–1.00)
GFR, Estimated: 4 mL/min — ABNORMAL LOW (ref >60)
Glucose, Bld: 119 mg/dL — ABNORMAL HIGH (ref 70–99)
Potassium: 4.3 mmol/L (ref 3.5–5.1)
Sodium: 132 mmol/L — ABNORMAL LOW (ref 135–145)

## 2020-07-08 LAB — CULTURE, BLOOD (SINGLE)

## 2020-07-08 LAB — GLUCOSE, CAPILLARY
Glucose-Capillary: 117 mg/dL — ABNORMAL HIGH (ref 70–99)
Glucose-Capillary: 116 mg/dL — ABNORMAL HIGH (ref 70–99)
Glucose-Capillary: 114 mg/dL — ABNORMAL HIGH (ref 70–99)
Glucose-Capillary: 111 mg/dL — ABNORMAL HIGH (ref 70–99)

## 2020-07-08 MED ORDER — PREDNISONE 20 MG PO TABS
40.0000 mg | ORAL_TABLET | Freq: Every day | ORAL | Status: DC
  Administered 2020-07-08 – 2020-07-10 (×3): 40 mg via ORAL
  Filled 2020-07-08 (×2): qty 2

## 2020-07-08 NOTE — Progress Notes (Signed)
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  Patient was having left arm pain for the last week, she had to shorten her hemodialysis sessions last 2 times.  Also complaining of shortness of breath.  In the emergency department she was febrile.  Patient was 87% on room air.  In the ER with leukocytosis, CT scan abdomen pelvis with new bibasilar airspace disease concerning for pneumonia and mild left-sided collecting system dilatation with asymmetrical fatty stranding about the left ureter with concern for ascending UTI.  Patient occasionally makes urine.  She was started on treatment with antibiotics for pneumonia and admitted to the hospital. Blood cultures growing MRSA bacteremia.   Assessment & Plan:   Principal Problem:   CAP (community acquired pneumonia) Active Problems:   Morbid obesity ([place])   Essential hypertension   GERD   Sleep apnea   Type 2 diabetes mellitus treated with insulin (HCC)   Hyperglycemia due to diabetes mellitus ([place])   Bipolar 1 disorder (HCC)   ESRD (end stage renal disease) on dialysis ([place])   UTI (urinary tract infection)   Hypoalbuminemia   Acute respiratory failure with hypoxia (HCC)  MRSA bacteremia , community acquired pneumonia suspected MRSA pneumonia with acute hypoxemic respiratory failure:  Continue vancomycin, dosed by pharmacy with levels. Blood cultures 12/9 with MRSA Blood cultures 12/11 drawn, results pending. 2D echocardiogram, no evidence of endocarditis. Painful left wrist and forearm, CT scan with contrast with no evidence of myositis or osteomyelitis. AV fistula with no evidence of infection. Followed by ID. If repeat  cultures negative, ID recommended 4 weeks of vancomycin, will need to be scheduled with hemodialysis.  Presumed UTI: Ruled out.  Hyperglycemia secondary to type 2 diabetes: On insulin that is continued.  ESRD on hemodialysis: On a schedule hemodialysis.  Using left arm fistula.  Functioning well.  Left forearm pain and swelling: No evidence of ischemia, vascularity is intact.  No local erythema but diffuse swelling.  Denies trauma.  CT scan with contrast with no evidence of myositis, abscess or bony abnormalities.  May have gouty arthritis.  No evidence of abscess or synovial effusion. Colchicine 0.6 mg given on 12/11 with some improvement. We will try some prednisone for presumed inflammatory arthritis.  Sleep apnea: Using CPAP.   DVT prophylaxis: heparin injection 5,000 Units Start: 07/05/20 2330 SCDs Start: 07/05/20 2316   Code Status: Full code Family Communication: Patient's daughter on the phone 12/11. Disposition Plan: Status is: Inpatient  Remains inpatient appropriate because:Inpatient level of care appropriate due to severity of illness   Dispo: The patient is from: Home              Anticipated d/c is to: Home              Anticipated d/c date is: 3 days              Patient currently is not medically stable to d/c.         Consultants:   Nephrology  ID  Procedures:   Hemodialysis  Antimicrobials:  Anti-infectives (From admission, onward)   Start     Dose/Rate Route Frequency Ordered Stop   07/09/20 1200  vancomycin (VANCOCIN) IVPB 1000  mg/200 mL premix        1,000 mg 200 mL/hr over 60 Minutes Intravenous Every M-W-F (Hemodialysis) 07/06/20 1129     07/06/20 2200  cefTRIAXone (ROCEPHIN) 1 g in sodium chloride 0.9 % 100 mL IVPB  Status:  Discontinued        1 g 200 mL/hr over 30 Minutes Intravenous Every 24 hours 07/06/20 0109 07/06/20 1041   07/06/20 2200  azithromycin (ZITHROMAX) 500 mg in sodium chloride 0.9 % 250 mL IVPB  Status:  Discontinued         500 mg 250 mL/hr over 60 Minutes Intravenous Every 24 hours 07/06/20 0109 07/07/20 0938   07/06/20 2000  vancomycin (VANCOCIN) IVPB 1000 mg/200 mL premix        1,000 mg 200 mL/hr over 60 Minutes Intravenous  Once 07/06/20 1413 07/06/20 2025   07/06/20 1800  vancomycin (VANCOREADY) IVPB 2000 mg/400 mL  Status:  Discontinued        2,000 mg 200 mL/hr over 120 Minutes Intravenous  Once 07/06/20 1212 07/06/20 1340   07/06/20 1330  vancomycin (VANCOREADY) IVPB 2000 mg/400 mL        2,000 mg 200 mL/hr over 120 Minutes Intravenous  Once 07/06/20 1340 07/06/20 1437   07/06/20 1100  vancomycin (VANCOREADY) IVPB 2000 mg/400 mL  Status:  Discontinued        2,000 mg 200 mL/hr over 120 Minutes Intravenous  Once 07/06/20 1035 07/06/20 1212   07/05/20 2130  cefTRIAXone (ROCEPHIN) 1 g in sodium chloride 0.9 % 100 mL IVPB        1 g 200 mL/hr over 30 Minutes Intravenous  Once 07/05/20 2120 07/06/20 0005   07/05/20 2130  azithromycin (ZITHROMAX) tablet 500 mg        500 mg Oral  Once 07/05/20 2120 07/05/20 2336         Subjective: Patient seen and examined.  Looks comfortable.  No overnight events.  Left wrist is still painful, however slightly improved than before.  Afebrile.  Objective: Vitals:   07/08/20 0400 07/08/20 0444 07/08/20 0500 07/08/20 0600  BP: (!) 166/65  (!) 157/49 (!) 159/108  Pulse: 80  80 80  Resp: 14  15 16   Temp:  97.6 F (36.4 C)    TempSrc:  Oral    SpO2: 97%  98% 97%  Weight:  131.5 kg    Height:        Intake/Output Summary (Last 24 hours) at 07/08/2020 1156 Last data filed at 07/07/2020 2001 Gross per 24 hour  Intake 0 ml  Output 0 ml  Net 0 ml   Filed Weights   07/06/20 1620 07/06/20 2045 07/08/20 0444  Weight: (!) 144.6 kg (!) 141.4 kg 131.5 kg    Examination:  General exam: Chronically sick looking.  In mild distress on manipulation of the left wrist. She is legally blind.  Does not see waving of hands. Respiratory system: Clear to  auscultation. Respiratory effort normal.  Mostly conducted airway sounds.  Difficult to auscultate. Cardiovascular system: S1 & S2 heard, RRR.  Gastrointestinal system: Obese and pendulous.  Nontender.  Bowel sounds present. Central nervous system: Alert and oriented. No focal neurological deficits. Extremities: Symmetric 5 x 5 power. Left upper extremity AV fistula present with thrill.  Nontender at the fistula. Tenderness with no swelling erythema, mild swelling on the dorsum of the forearm.   Data Reviewed: I have personally reviewed following labs and imaging studies  CBC: Recent Labs  Lab 07/02/20 1241  07/05/20 1813 07/06/20 0500 07/07/20 0334 07/08/20 0356  WBC 7.4 11.9* 11.2* 10.8* 10.5  NEUTROABS 5.2 9.7*  --  8.7* 8.5*  HGB 11.1* 11.1* 10.7* 9.7* 9.8*  HCT 35.2* 36.4 35.2* 31.5* 32.8*  MCV 95.7 96.8 95.9 96.3 98.2  PLT 108* 225 234 202 710   Basic Metabolic Panel: Recent Labs  Lab 07/02/20 1021 07/05/20 1813 07/06/20 0500 07/07/20 0334 07/08/20 0356  NA 133* 132* 134* 132* 132*  K 4.5 4.6 4.6 3.8 4.3  CL 94* 91* 92* 91* 91*  CO2 21* 22 23 28 26   GLUCOSE 168* 158* 130* 167* 119*  BUN 65* 57* 68* 35* 55*  CREATININE 12.85* 12.35* 13.55* 7.46* 10.15*  CALCIUM 8.5* 8.3* 8.8* 8.5* 8.5*  MG  --   --  1.9 1.8  --   PHOS  --   --  6.5*  --   --    GFR: Estimated Creatinine Clearance: 9.2 mL/min (A) (by C-G formula based on SCr of 10.15 mg/dL (H)). Liver Function Tests: Recent Labs  Lab 07/05/20 1813 07/06/20 0500  AST 29 16  ALT 38 26  ALKPHOS 146* 130*  BILITOT 1.6* 0.8  PROT 8.1 7.7  ALBUMIN 3.3* 3.0*   Recent Labs  Lab 07/05/20 1813  LIPASE 23   No results for input(s): AMMONIA in the last 168 hours. Coagulation Profile: Recent Labs  Lab 07/05/20 1813 07/06/20 0500  INR 1.3* 1.3*   Cardiac Enzymes: No results for input(s): CKTOTAL, CKMB, CKMBINDEX, TROPONINI in the last 168 hours. BNP (last 3 results) No results for input(s): PROBNP in  the last 8760 hours. HbA1C: Recent Labs    07/05/20 1813  HGBA1C 6.0*   CBG: Recent Labs  Lab 07/06/20 2213 07/07/20 1105 07/07/20 1650 07/07/20 2012 07/08/20 0749  GLUCAP 201* 122* 115* 127* 117*   Lipid Profile: No results for input(s): CHOL, HDL, LDLCALC, TRIG, CHOLHDL, LDLDIRECT in the last 72 hours. Thyroid Function Tests: No results for input(s): TSH, T4TOTAL, FREET4, T3FREE, THYROIDAB in the last 72 hours. Anemia Panel: No results for input(s): VITAMINB12, FOLATE, FERRITIN, TIBC, IRON, RETICCTPCT in the last 72 hours. Sepsis Labs: Recent Labs  Lab 07/05/20 1813 07/05/20 2009  LATICACIDVEN 1.6 0.9    Recent Results (from the past 240 hour(s))  Resp Panel by RT-PCR (Flu A&B, Covid) Nasopharyngeal Swab     Status: None   Collection Time: 07/02/20 10:05 AM   Specimen: Nasopharyngeal Swab; Nasopharyngeal(NP) swabs in vial transport medium  Result Value Ref Range Status   SARS Coronavirus 2 by RT PCR NEGATIVE NEGATIVE Final    Comment: (NOTE) SARS-CoV-2 target nucleic acids are NOT DETECTED.  The SARS-CoV-2 RNA is generally detectable in upper respiratory specimens during the acute phase of infection. The lowest concentration of SARS-CoV-2 viral copies this assay can detect is 138 copies/mL. A negative result does not preclude SARS-Cov-2 infection and should not be used as the sole basis for treatment or other patient management decisions. A negative result may occur with  improper specimen collection/handling, submission of specimen other than nasopharyngeal swab, presence of viral mutation(s) within the areas targeted by this assay, and inadequate number of viral copies(<138 copies/mL). A negative result must be combined with clinical observations, patient history, and epidemiological information. The expected result is Negative.  Fact Sheet for Patients:  EntrepreneurPulse.com.au  Fact Sheet for Healthcare Providers:   IncredibleEmployment.be  This test is no t yet approved or cleared by the [place] FDA and  has been authorized for detection  and/or diagnosis of SARS-CoV-2 by FDA under an Emergency Use Authorization (EUA). This EUA will remain  in effect (meaning this test can be used) for the duration of the COVID-19 declaration under Section 564(b)(1) of the Act, 21 U.S.C.section 360bbb-3(b)(1), unless the authorization is terminated  or revoked sooner.       Influenza A by PCR NEGATIVE NEGATIVE Final   Influenza B by PCR NEGATIVE NEGATIVE Final    Comment: (NOTE) The Xpert Xpress SARS-CoV-2/FLU/RSV plus assay is intended as an aid in the diagnosis of influenza from Nasopharyngeal swab specimens and should not be used as a sole basis for treatment. Nasal washings and aspirates are unacceptable for Xpert Xpress SARS-CoV-2/FLU/RSV testing.  Fact Sheet for Patients: EntrepreneurPulse.com.au  Fact Sheet for Healthcare Providers: IncredibleEmployment.be  This test is not yet approved or cleared by the [place] FDA and has been authorized for detection and/or diagnosis of SARS-CoV-2 by FDA under an Emergency Use Authorization (EUA). This EUA will remain in effect (meaning this test can be used) for the duration of the COVID-19 declaration under Section 564(b)(1) of the Act, 21 U.S.C. section 360bbb-3(b)(1), unless the authorization is terminated or revoked.  Performed at [facility], [address] [place], [place] [address]   Culture, blood (single)     Status: Abnormal   Collection Time: 07/05/20  6:11 PM   Specimen: BLOOD  Result Value Ref Range Status   Specimen Description   Final    BLOOD BLOOD RIGHT WRIST Performed at [facility], [address], [place], [place] [address]    Special Requests   Final    BOTTLES DRAWN AEROBIC AND ANAEROBIC Blood Culture results may not be optimal due to an inadequate volume of blood  received in culture bottles Performed at [facility], [address], [place], [place] [address]    Culture  Setup Time   Final    GRAM POSITIVE COCCI ANAEROBIC AND AEROBIC BOTTLES  Gram Stain Report Called to,Read Back By and Verified With: LOOMIS,K@1150  BY MATTHEWS,B12.10.21 CRITICAL VALUE NOTED.  VALUE IS CONSISTENT WITH PREVIOUSLY REPORTED AND CALLED VALUE.    Culture (A)  Final    STAPHYLOCOCCUS AUREUS SUSCEPTIBILITIES PERFORMED ON PREVIOUS CULTURE WITHIN THE LAST 5 DAYS. Performed at [facility], [place] [address] [place], [place] [address]    Report Status 07/08/2020 FINAL  Final  Blood culture (routine single)     Status: Abnormal   Collection Time: 07/05/20  6:13 PM   Specimen: BLOOD RIGHT WRIST  Result Value Ref Range Status   Specimen Description   Final    BLOOD RIGHT WRIST Performed at [facility], [address] [place], [place] [address]    Special Requests   Final    BOTTLES DRAWN AEROBIC AND ANAEROBIC Blood Culture results may not be optimal due to an inadequate volume of blood received in culture bottles Performed at [address] [place], [place] [address]    Culture  Setup Time   Final    GRAM POSITIVE COCCI BOTH ANAEROBIC AND AEROBIC BOTTLES Gram Stain Report Called to,Read Back By and Verified With: PARNELL,R @0945  BY MATTHEWS B 12.10.21 CRITICAL RESULT CALLED TO, READ BACK BY AND VERIFIED WITH: PHARMD L POOLE [phone] FCP Performed at [facility], [place] [address], [place], [place] [address]    Culture METHICILLIN RESISTANT STAPHYLOCOCCUS AUREUS (A)  Final   Report Status 07/08/2020 FINAL  Final   Organism ID, Bacteria METHICILLIN RESISTANT STAPHYLOCOCCUS AUREUS  Final  Susceptibility   Methicillin resistant staphylococcus aureus - MIC*    CIPROFLOXACIN >=8 RESISTANT Resistant     ERYTHROMYCIN >=8 RESISTANT Resistant     GENTAMICIN <=0.5 SENSITIVE Sensitive     OXACILLIN >=4 RESISTANT Resistant     TETRACYCLINE <=1  SENSITIVE Sensitive     VANCOMYCIN <=0.5 SENSITIVE Sensitive     TRIMETH/SULFA <=10 SENSITIVE Sensitive     CLINDAMYCIN <=0.25 SENSITIVE Sensitive     RIFAMPIN <=0.5 SENSITIVE Sensitive     Inducible Clindamycin NEGATIVE Sensitive     * METHICILLIN RESISTANT STAPHYLOCOCCUS AUREUS  Blood Culture ID Panel (Reflexed)     Status: Abnormal   Collection Time: 07/05/20  6:13 PM  Result Value Ref Range Status   Enterococcus faecalis NOT DETECTED NOT DETECTED Final   Enterococcus Faecium NOT DETECTED NOT DETECTED Final   Listeria monocytogenes NOT DETECTED NOT DETECTED Final   Staphylococcus species DETECTED (A) NOT DETECTED Final    Comment: CRITICAL RESULT CALLED TO, READ BACK BY AND VERIFIED WITH: PHARMD L POOLE [phone] FCP    Staphylococcus aureus (BCID) DETECTED (A) NOT DETECTED Final    Comment: Methicillin (oxacillin)-resistant Staphylococcus aureus (MRSA). MRSA is predictably resistant to beta-lactam antibiotics (except ceftaroline). Preferred therapy is vancomycin unless clinically contraindicated. Patient requires contact precautions if  hospitalized. CRITICAL RESULT CALLED TO, READ BACK BY AND VERIFIED WITH: PHARMD L POOLE [id number] FCP    Staphylococcus epidermidis NOT DETECTED NOT DETECTED Final   Staphylococcus lugdunensis NOT DETECTED NOT DETECTED Final   Streptococcus species NOT DETECTED NOT DETECTED Final   Streptococcus agalactiae NOT DETECTED NOT DETECTED Final   Streptococcus pneumoniae NOT DETECTED NOT DETECTED Final   Streptococcus pyogenes NOT DETECTED NOT DETECTED Final   A.calcoaceticus-baumannii NOT DETECTED NOT DETECTED Final   Bacteroides fragilis NOT DETECTED NOT DETECTED Final   Enterobacterales NOT DETECTED NOT DETECTED Final   Enterobacter cloacae complex NOT DETECTED NOT DETECTED Final   Escherichia coli NOT DETECTED NOT DETECTED Final   Klebsiella aerogenes NOT DETECTED NOT DETECTED Final   Klebsiella oxytoca NOT DETECTED NOT DETECTED Final    Klebsiella pneumoniae NOT DETECTED NOT DETECTED Final   Proteus species NOT DETECTED NOT DETECTED Final   Salmonella species NOT DETECTED NOT DETECTED Final   Serratia marcescens NOT DETECTED NOT DETECTED Final   Haemophilus influenzae NOT DETECTED NOT DETECTED Final   Neisseria meningitidis NOT DETECTED NOT DETECTED Final   Pseudomonas aeruginosa NOT DETECTED NOT DETECTED Final   Stenotrophomonas maltophilia NOT DETECTED NOT DETECTED Final   Candida albicans NOT DETECTED NOT DETECTED Final   Candida auris NOT DETECTED NOT DETECTED Final   Candida glabrata NOT DETECTED NOT DETECTED Final   Candida krusei NOT DETECTED NOT DETECTED Final   Candida parapsilosis NOT DETECTED NOT DETECTED Final   Candida tropicalis NOT DETECTED NOT DETECTED Final   Cryptococcus neoformans/gattii NOT DETECTED NOT DETECTED Final   Meth resistant mecA/C and MREJ DETECTED (A) NOT DETECTED Final    Comment: CRITICAL RESULT CALLED TO, READ BACK BY AND VERIFIED WITH: PHARMD L POOLE [phone] FCP Performed at [facility], [address], [place], [place] [address]   Resp Panel by RT-PCR (Flu A&B, Covid) Nasopharyngeal Swab     Status: None   Collection Time: 07/05/20  9:49 PM   Specimen: Nasopharyngeal Swab; Nasopharyngeal(NP) swabs in vial transport medium  Result Value Ref Range Status   SARS Coronavirus 2 by RT PCR NEGATIVE NEGATIVE Final    Comment: (NOTE) SARS-CoV-2 target nucleic acids are  NOT DETECTED.  The SARS-CoV-2 RNA is generally detectable in upper respiratory specimens during the acute phase of infection. The lowest concentration of SARS-CoV-2 viral copies this assay can detect is 138 copies/mL. A negative result does not preclude SARS-Cov-2 infection and should not be used as the sole basis for treatment or other patient management decisions. A negative result may occur with  improper specimen collection/handling, submission of specimen other than nasopharyngeal swab, presence of viral  mutation(s) within the areas targeted by this assay, and inadequate number of viral copies(<138 copies/mL). A negative result must be combined with clinical observations, patient history, and epidemiological information. The expected result is Negative.  Fact Sheet for Patients:  EntrepreneurPulse.com.au  Fact Sheet for Healthcare Providers:  IncredibleEmployment.be  This test is no t yet approved or cleared by the [place] FDA and  has been authorized for detection and/or diagnosis of SARS-CoV-2 by FDA under an Emergency Use Authorization (EUA). This EUA will remain  in effect (meaning this test can be used) for the duration of the COVID-19 declaration under Section 564(b)(1) of the Act, 21 U.S.C.section 360bbb-3(b)(1), unless the authorization is terminated  or revoked sooner.       Influenza A by PCR NEGATIVE NEGATIVE Final   Influenza B by PCR NEGATIVE NEGATIVE Final    Comment: (NOTE) The Xpert Xpress SARS-CoV-2/FLU/RSV plus assay is intended as an aid in the diagnosis of influenza from Nasopharyngeal swab specimens and should not be used as a sole basis for treatment. Nasal washings and aspirates are unacceptable for Xpert Xpress SARS-CoV-2/FLU/RSV testing.  Fact Sheet for Patients: EntrepreneurPulse.com.au  Fact Sheet for Healthcare Providers: IncredibleEmployment.be  This test is not yet approved or cleared by the [place] FDA and has been authorized for detection and/or diagnosis of SARS-CoV-2 by FDA under an Emergency Use Authorization (EUA). This EUA will remain in effect (meaning this test can be used) for the duration of the COVID-19 declaration under Section 564(b)(1) of the Act, 21 U.S.C. section 360bbb-3(b)(1), unless the authorization is terminated or revoked.  Performed at [facility], [address] [place], [place] [address]   MRSA PCR Screening     Status: Abnormal    Collection Time: 07/06/20 11:31 AM   Specimen: Nasal Mucosa; Nasopharyngeal  Result Value Ref Range Status   MRSA by PCR POSITIVE (A) NEGATIVE Final    Comment:        The GeneXpert MRSA Assay (FDA approved for NASAL specimens only), is one component of a comprehensive MRSA colonization surveillance program. It is not intended to diagnose MRSA infection nor to guide or monitor treatment for MRSA infections. RESULT CALLED TO, READ BACK BY AND VERIFIED WITH: LOOMIS K. @ 3818 ON 121021 BY HENDERSON L. Performed at [facility], [address], [place], [place] [address]   Culture, blood (routine x 2)     Status: None (Preliminary result)   Collection Time: 07/07/20  8:59 AM   Specimen: BLOOD RIGHT HAND  Result Value Ref Range Status   Specimen Description   Final    BLOOD RIGHT HAND BOTTLES DRAWN AEROBIC AND ANAEROBIC   Special Requests Blood Culture adequate volume  Final   Culture   Final    NO GROWTH < 24 HOURS Performed at [facility], [address], [place], [place] [address]    Report Status PENDING  Incomplete         Radiology Studies: CT WRIST LEFT W CONTRAST  Result Date: 07/07/2020 CLINICAL DATA:  Left forearm pain tendons.  MRSA bacteremia. EXAM: CT OF THE UPPER LEFT EXTREMITY WITH CONTRAST TECHNIQUE: Multidetector CT imaging of the upper left extremity was performed according to the standard protocol following intravenous contrast administration. CONTRAST:  67mL OMNIPAQUE IOHEXOL 300 MG/ML  SOLN COMPARISON:  Radiographs 07/05/2020 FINDINGS: Very limited examination due to positioning artifact patient's adjacent body. The bony structures are grossly intact. No obvious destructive bony changes to suggest septic arthritis or osteomyelitis. No obvious subcutaneous abscess or findings for pyomyositis. IMPRESSION: 1. Very limited examination. 2. No obvious destructive bony changes to suggest septic arthritis or osteomyelitis. 3. No obvious subcutaneous abscess or findings  for pyomyositis. Electronically Signed   By: Marijo Sanes M.D.   On: 07/07/2020 10:52   [place] UPPER EXTREMITY DUPLEX LEFT (NON-WBI)  Result Date: 07/07/2020 CLINICAL DATA:  Forearm pain x1 week. Left AV fistula placement January 2020. Bacteremia. Diabetes, obesity. EXAM: LEFT UPPER EXTREMITY ARTERIAL DUPLEX SCAN TECHNIQUE: Gray-scale sonography as well as color Doppler and duplex ultrasound was performed to evaluate the arteries of the upper extremity. COMPARISON:  CT from the same day FINDINGS: Subclavian artery normal arterial waveform and color Doppler signal fell elevated velocities up to 259 cm/seconds. Axillary artery low resistance arterial waveform with elevated velocities up to 319 cm/seconds. Brachial artery antegrade low resistance arterial waveform with elevated velocities throughout up to 523 cm/seconds Patency of brachiocephalic fistula with fusiform dilatation of the outflow vein, no intraluminal thrombus. Radial artery high resistance arterial waveform with normal color Doppler signal. Ulnar artery normal high resistance arterial waveform with no longer normal color Doppler signal and velocities. IMPRESSION: 1. Patent left brachiocephalic fistula with expected elevated velocities in the proximal arterial inflow. 2. Patency of radial and ulnar arteries. Electronically Signed   By: Lucrezia Europe M.D.   On: 07/07/2020 16:02   ECHOCARDIOGRAM COMPLETE  Result Date: 07/07/2020    ECHOCARDIOGRAM REPORT   Patient Name:   VEARL AITKEN Date of Exam: 07/07/2020 Medical Rec #:  [id number]     Height:       67.0 in Accession #:    [id number]    Weight:       311.7 lb Date of Birth:  [name]     BSA:          2.442 m Patient Age:    23 years      BP:           161/56 mmHg Patient Gender: F             HR:           78 bpm. Exam Location:  [facility] Procedure: 2D Echo, Cardiac Doppler and Color Doppler Indications:    Bacteremia  History:        Patient has prior history of Echocardiogram examinations,  most                 recent 04/07/2017. COPD, Signs/Symptoms:Shortness of Breath and                 Bacteremia; Risk Factors:Hypertension, Diabetes and Obesity.                 ESRD, MSSA, pneumonia, resp. failure,.  Sonographer:    Dustin Flock RDCS Referring Phys: [id number] [place]  1. Left ventricular ejection fraction, by estimation, is 55 to 60%. The left ventricle has normal function. The left ventricle has no regional wall motion abnormalities. There is mild concentric left ventricular hypertrophy. Left ventricular diastolic parameters are consistent with Grade  I diastolic dysfunction (impaired relaxation). Elevated left atrial pressure.  2. Right ventricular systolic function is normal. The right ventricular size is normal.  3. Left atrial size was mildly dilated.  4. The mitral valve is normal in structure. Mild mitral valve regurgitation. No evidence of mitral stenosis.  5. The aortic valve is normal in structure. Aortic valve regurgitation is not visualized. No aortic stenosis is present.  6. The inferior vena cava is normal in size with greater than 50% respiratory variability, suggesting right atrial pressure of 3 mmHg. Conclusion(s)/Recommendation(s): No evidence of valvular vegetations on this transthoracic echocardiogram. Would recommend a transesophageal echocardiogram to exclude infective endocarditis if clinically indicated. FINDINGS  Left Ventricle: Left ventricular ejection fraction, by estimation, is 55 to 60%. The left ventricle has normal function. The left ventricle has no regional wall motion abnormalities. The left ventricular internal cavity size was normal in size. There is  mild concentric left ventricular hypertrophy. Left ventricular diastolic parameters are consistent with Grade I diastolic dysfunction (impaired relaxation). Elevated left atrial pressure. Right Ventricle: The right ventricular size is normal. No increase in right ventricular wall thickness. Right  ventricular systolic function is normal. Left Atrium: Left atrial size was mildly dilated. Right Atrium: Right atrial size was normal in size. Pericardium: There is no evidence of pericardial effusion. Mitral Valve: The mitral valve is normal in structure. Mild mitral valve regurgitation. No evidence of mitral valve stenosis. There is no evidence of mitral valve vegetation. Tricuspid Valve: The tricuspid valve is normal in structure. Tricuspid valve regurgitation is mild . No evidence of tricuspid stenosis. There is no evidence of tricuspid valve vegetation. Aortic Valve: The aortic valve is normal in structure. Aortic valve regurgitation is not visualized. No aortic stenosis is present. There is no evidence of aortic valve vegetation. Pulmonic Valve: The pulmonic valve was normal in structure. Pulmonic valve regurgitation is not visualized. No evidence of pulmonic stenosis. Aorta: The aortic root is normal in size and structure. Venous: The inferior vena cava is normal in size with greater than 50% respiratory variability, suggesting right atrial pressure of 3 mmHg. IAS/Shunts: No atrial level shunt detected by color flow Doppler.  LEFT VENTRICLE PLAX 2D LVIDd:         4.71 cm Diastology LVIDs:         2.56 cm LV e' medial:    4.46 cm/s LV PW:         1.12 cm LV E/e' medial:  27.4 LV IVS:        1.29 cm LV e' lateral:   7.94 cm/s                        LV E/e' lateral: 15.4  RIGHT VENTRICLE RV Basal diam:  3.45 cm RV S prime:     10.90 cm/s TAPSE (M-mode): 2.1 cm LEFT ATRIUM           Index       RIGHT ATRIUM           Index LA diam:      3.80 cm 1.56 cm/m  RA Area:     14.90 cm LA Vol (A2C): 35.4 ml 14.50 ml/m RA Volume:   39.90 ml  16.34 ml/m LA Vol (A4C): 71.1 ml 29.11 ml/m  AORTIC VALVE LVOT Vmax:   143.00 cm/s LVOT Vmean:  88.800 cm/s LVOT VTI:    0.258 m  AORTA Ao Root diam: 2.80 cm MITRAL VALVE MV Area (PHT): 4.10 cm  SHUNTS MV Decel Time: 185 msec     Systemic VTI: 0.26 m MV E velocity: 122.00 cm/s  MV A velocity: 95.10 cm/s MV E/A ratio:  1.28 Ena Dawley MD Electronically signed by Ena Dawley MD Signature Date/Time: 07/07/2020/1:14:43 PM    Final         Scheduled Meds: . atorvastatin  20 mg Oral Daily  . Chlorhexidine Gluconate Cloth  6 each Topical Q0600  . dextromethorphan-guaiFENesin  1 tablet Oral BID  . feeding supplement (GLUCERNA SHAKE)  237 mL Oral TID BM  . gabapentin  100 mg Oral QHS  . heparin  5,000 Units Subcutaneous Q8H  . insulin aspart  0-5 Units Subcutaneous QHS  . insulin aspart  0-6 Units Subcutaneous TID WC  . mouth rinse  15 mL Mouth Rinse BID  . mupirocin ointment  1 application Nasal BID  . pantoprazole  40 mg Oral Daily  . PARoxetine  20 mg Oral QPM  . predniSONE  40 mg Oral Q breakfast  . sevelamer carbonate  800 mg Oral TID WC   Continuous Infusions: . sodium chloride    . sodium chloride    . [START ON 07/09/2020] vancomycin       LOS: 3 days    Time spent: 30 minutes    Barb Merino, MD [facility] Pager [phone]

## 2020-07-09 LAB — CBC WITH DIFFERENTIAL/PLATELET
Abs Immature Granulocytes: 0.08 10*3/uL — ABNORMAL HIGH (ref 0.00–0.07)
Basophils Absolute: 0.1 10*3/uL (ref 0.0–0.1)
Basophils Relative: 0 %
Eosinophils Absolute: 0.2 10*3/uL (ref 0.0–0.5)
Eosinophils Relative: 2 %
HCT: 31.4 % — ABNORMAL LOW (ref 36.0–46.0)
Hemoglobin: 9.6 g/dL — ABNORMAL LOW (ref 12.0–15.0)
Immature Granulocytes: 1 %
Lymphocytes Relative: 9 %
Lymphs Abs: 1 10*3/uL (ref 0.7–4.0)
MCH: 29.4 pg (ref 26.0–34.0)
MCHC: 30.6 g/dL (ref 30.0–36.0)
MCV: 96.3 fL (ref 80.0–100.0)
Monocytes Absolute: 0.7 10*3/uL (ref 0.1–1.0)
Monocytes Relative: 6 %
Neutro Abs: 9.9 10*3/uL — ABNORMAL HIGH (ref 1.7–7.7)
Neutrophils Relative %: 82 %
Platelets: 213 10*3/uL (ref 150–400)
RBC: 3.26 MIL/uL — ABNORMAL LOW (ref 3.87–5.11)
RDW: 16.9 % — ABNORMAL HIGH (ref 11.5–15.5)
WBC: 11.9 10*3/uL — ABNORMAL HIGH (ref 4.0–10.5)
nRBC: 0 % (ref 0.0–0.2)

## 2020-07-09 LAB — IRON AND TIBC
Iron: 36 ug/dL (ref 28–170)
Saturation Ratios: 23 % (ref 10.4–31.8)
TIBC: 159 ug/dL — ABNORMAL LOW (ref 250–450)
UIBC: 123 ug/dL

## 2020-07-09 LAB — GLUCOSE, CAPILLARY
Glucose-Capillary: 191 mg/dL — ABNORMAL HIGH (ref 70–99)
Glucose-Capillary: 114 mg/dL — ABNORMAL HIGH (ref 70–99)
Glucose-Capillary: 122 mg/dL — ABNORMAL HIGH (ref 70–99)
Glucose-Capillary: 154 mg/dL — ABNORMAL HIGH (ref 70–99)
Glucose-Capillary: 169 mg/dL — ABNORMAL HIGH (ref 70–99)

## 2020-07-09 LAB — BASIC METABOLIC PANEL
Anion gap: 18 — ABNORMAL HIGH (ref 5–15)
BUN: 72 mg/dL — ABNORMAL HIGH (ref 6–20)
CO2: 25 mmol/L (ref 22–32)
Calcium: 8.6 mg/dL — ABNORMAL LOW (ref 8.9–10.3)
Chloride: 89 mmol/L — ABNORMAL LOW (ref 98–111)
Creatinine, Ser: 11.8 mg/dL — ABNORMAL HIGH (ref 0.44–1.00)
GFR, Estimated: 4 mL/min — ABNORMAL LOW (ref >60)
Glucose, Bld: 121 mg/dL — ABNORMAL HIGH (ref 70–99)
Potassium: 4.9 mmol/L (ref 3.5–5.1)
Sodium: 132 mmol/L — ABNORMAL LOW (ref 135–145)

## 2020-07-09 LAB — FERRITIN: Ferritin: 2821 ng/mL — ABNORMAL HIGH (ref 11–307)

## 2020-07-09 MED ORDER — DARBEPOETIN ALFA 25 MCG/0.42ML IJ SOSY
25.0000 ug | PREFILLED_SYRINGE | INTRAMUSCULAR | Status: DC
  Filled 2020-07-09: qty 0.42

## 2020-07-09 MED ORDER — DARBEPOETIN ALFA 25 MCG/0.42ML IJ SOSY
25.0000 ug | PREFILLED_SYRINGE | INTRAMUSCULAR | Status: DC
  Administered 2020-07-11: 25 ug via INTRAVENOUS
  Filled 2020-07-09: qty 0.42

## 2020-07-09 MED ORDER — COLCHICINE 0.6 MG PO TABS
0.6000 mg | ORAL_TABLET | Freq: Once | ORAL | Status: AC
  Administered 2020-07-09: 0.6 mg via ORAL

## 2020-07-09 NOTE — Progress Notes (Signed)
Patient ID: Martha [name], female   DOB: 1966/01/25, 54 y.o.   MRN: [id number] S: Doesn't feel well this morning. O:BP (!) 184/74   Pulse 76   Temp 98.4 F (36.9 C) (Oral)   Resp 15   Ht 5\' 7"  (1.702 m)   Wt 131.5 kg   LMP 08/03/2008 (Exact Date)   SpO2 96%   BMI 45.41 kg/m  No intake or output data in the 24 hours ending 07/09/20 0945 Intake/Output: No intake/output data recorded.  Intake/Output this shift:  No intake/output data recorded. Weight change:  Gen: Obese WF lying in bed in mild distress, slowed mentation CVS: RRR Resp: cta Abd: obese, +BS, soft, NT/ND Ext: no edema, LUE AVF +T/B  Recent Labs  Lab 07/02/20 1021 07/05/20 1813 07/06/20 0500 07/07/20 0334 07/08/20 0356 07/09/20 0451  NA 133* 132* 134* 132* 132* 132*  K 4.5 4.6 4.6 3.8 4.3 4.9  CL 94* 91* 92* 91* 91* 89*  CO2 21* 22 23 28 26 25   GLUCOSE 168* 158* 130* 167* 119* 121*  BUN 65* 57* 68* 35* 55* 72*  CREATININE 12.85* 12.35* 13.55* 7.46* 10.15* 11.80*  ALBUMIN  --  3.3* 3.0*  --   --   --   CALCIUM 8.5* 8.3* 8.8* 8.5* 8.5* 8.6*  PHOS  --   --  6.5*  --   --   --   AST  --  29 16  --   --   --   ALT  --  38 26  --   --   --    Liver Function Tests: Recent Labs  Lab 07/05/20 1813 07/06/20 0500  AST 29 16  ALT 38 26  ALKPHOS 146* 130*  BILITOT 1.6* 0.8  PROT 8.1 7.7  ALBUMIN 3.3* 3.0*   Recent Labs  Lab 07/05/20 1813  LIPASE 23   No results for input(s): AMMONIA in the last 168 hours. CBC: Recent Labs  Lab 07/05/20 1813 07/06/20 0500 07/07/20 0334 07/08/20 0356 07/09/20 0451  WBC 11.9* 11.2* 10.8* 10.5 11.9*  NEUTROABS 9.7*  --  8.7* 8.5* 9.9*  HGB 11.1* 10.7* 9.7* 9.8* 9.6*  HCT 36.4 35.2* 31.5* 32.8* 31.4*  MCV 96.8 95.9 96.3 98.2 96.3  PLT 225 234 202 199 213   Cardiac Enzymes: No results for input(s): CKTOTAL, CKMB, CKMBINDEX, TROPONINI in the last 168 hours. CBG: Recent Labs  Lab 07/08/20 0749 07/08/20 1236 07/08/20 1559 07/08/20 2016 07/09/20 0729  GLUCAP  117* 116* 114* 111* 114*    Iron Studies: No results for input(s): IRON, TIBC, TRANSFERRIN, FERRITIN in the last 72 hours. Studies/Results: [place] UPPER EXTREMITY DUPLEX LEFT (NON-WBI)  Result Date: 07/07/2020 CLINICAL DATA:  Forearm pain x1 week. Left AV fistula placement January 2020. Bacteremia. Diabetes, obesity. EXAM: LEFT UPPER EXTREMITY ARTERIAL DUPLEX SCAN TECHNIQUE: Gray-scale sonography as well as color Doppler and duplex ultrasound was performed to evaluate the arteries of the upper extremity. COMPARISON:  CT from the same day FINDINGS: Subclavian artery normal arterial waveform and color Doppler signal fell elevated velocities up to 259 cm/seconds. Axillary artery low resistance arterial waveform with elevated velocities up to 319 cm/seconds. Brachial artery antegrade low resistance arterial waveform with elevated velocities throughout up to 523 cm/seconds Patency of brachiocephalic fistula with fusiform dilatation of the outflow vein, no intraluminal thrombus. Radial artery high resistance arterial waveform with normal color Doppler signal. Ulnar artery normal high resistance arterial waveform with no longer normal color Doppler signal and velocities. IMPRESSION: 1. Patent left  brachiocephalic fistula with expected elevated velocities in the proximal arterial inflow. 2. Patency of radial and ulnar arteries. Electronically Signed   By: Lucrezia Europe M.D.   On: 07/07/2020 16:02   . atorvastatin  20 mg Oral Daily  . Chlorhexidine Gluconate Cloth  6 each Topical Q0600  . colchicine  0.6 mg Oral Once  . dextromethorphan-guaiFENesin  1 tablet Oral BID  . feeding supplement (GLUCERNA SHAKE)  237 mL Oral TID BM  . gabapentin  100 mg Oral QHS  . heparin  5,000 Units Subcutaneous Q8H  . insulin aspart  0-5 Units Subcutaneous QHS  . insulin aspart  0-6 Units Subcutaneous TID WC  . mouth rinse  15 mL Mouth Rinse BID  . mupirocin ointment  1 application Nasal BID  . pantoprazole  40 mg Oral Daily  .  PARoxetine  20 mg Oral QPM  . predniSONE  40 mg Oral Q breakfast  . sevelamer carbonate  800 mg Oral TID WC    BMET    Component Value Date/Time   NA 132 (L) 07/09/2020 0451   K 4.9 07/09/2020 0451   CL 89 (L) 07/09/2020 0451   CO2 25 07/09/2020 0451   GLUCOSE 121 (H) 07/09/2020 0451   BUN 72 (H) 07/09/2020 0451   CREATININE 11.80 (H) 07/09/2020 0451   CALCIUM 8.6 (L) 07/09/2020 0451   CALCIUM 9.4 04/10/2017 0425   GFRNONAA 4 (L) 07/09/2020 0451   GFRAA 8 (L) 03/06/2020 0215   CBC    Component Value Date/Time   WBC 11.9 (H) 07/09/2020 0451   RBC 3.26 (L) 07/09/2020 0451   HGB 9.6 (L) 07/09/2020 0451   HGB 11.5 03/16/2013 0000   HCT 31.4 (L) 07/09/2020 0451   HCT 29.3 (L) 04/06/2017 0357   HCT 36 03/16/2013 0000   PLT 213 07/09/2020 0451   MCV 96.3 07/09/2020 0451   MCV 87.8 03/16/2013 0000   MCH 29.4 07/09/2020 0451   MCHC 30.6 07/09/2020 0451   RDW 16.9 (H) 07/09/2020 0451   LYMPHSABS 1.0 07/09/2020 0451   MONOABS 0.7 07/09/2020 0451   EOSABS 0.2 07/09/2020 0451   BASOSABS 0.1 07/09/2020 0451     Assessment/Plan:  1. MRSA bacteremia presumably due to pneumonia- on vancomycin. 2. ESRD- continue with HD on MWF schedule 3. Pain of left hand/wrist- possible steal syndrome but also complaining of leg pain with HD (not cramping).  Will continue to use AVF with HD for now and would recommend consulting Dr. Donnetta Hutching to evaluate this week. 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 5. HTN/volume- UF as tolerated. 6. SHPTH- continue with binders and follow. 7. DM type 2- per primary 8. OSA on CPAP  Donetta Potts, MD [employer] [phone]

## 2020-07-09 NOTE — Plan of Care (Signed)
  Problem: Acute Rehab PT Goals(only PT should resolve) Goal: Pt Will Go Supine/Side To Sit Outcome: Progressing Flowsheets (Taken 07/09/2020 1513) Pt will go Supine/Side to Sit:  with modified independence  with supervision Goal: Patient Will Transfer Sit To/From Stand Outcome: Progressing Flowsheets (Taken 07/09/2020 1513) Patient will transfer sit to/from stand:  with supervision  with min guard assist Goal: Pt Will Transfer Bed To Chair/Chair To Bed Outcome: Progressing Flowsheets (Taken 07/09/2020 1513) Pt will Transfer Bed to Chair/Chair to Bed:  min guard assist  with min assist Goal: Pt Will Ambulate Outcome: Progressing Flowsheets (Taken 07/09/2020 1513) Pt will Ambulate:  25 feet  with min guard assist  with minimal assist  with rolling walker   3:17 PM, 07/09/20 Lonell Grandchild, MPT Physical Therapist with [facility] 336 [phone] office [phone] mobile phone

## 2020-07-09 NOTE — Progress Notes (Signed)
      INFECTIOUS DISEASE ATTENDING ADDENDUM:   Date: 07/09/2020  Patient name: Martha [name] record number: [id number]  Date of birth: 10-26-1965    Is a 54 year old female with bipolar disease hypertension diabetes COPD heart failure end-stage renal disease on hemodialysis who was admitted on 9 December with MRSA bacteremia  Her 2D echocardiogram is without vegetations.  She has not had TEE.  In looking through the chart it appears she has had some problems of left upper extremity pain  She has had a CT of the left wrist which was negative for osteomyelitis or septic joint.  Extremity duplex shows a patent left brachiocephalic fistula.  I cannot assess her LUE from [facility]. Has [facility] evaluated this?  Repeat low blood cultures are negative so far.  Absent a  transesophageal echocardiogram improve of no deep infection she will need 6 weeks of parenteral antibiotics.    Alcide Evener 07/09/2020, 3:38 PM

## 2020-07-09 NOTE — Care Management Note (Addendum)
RE: Martha [name] DOB: Aug 28, 1965 Date: 07/09/2020 MUST ID: [id number]  To Whom It May Concern:  Please be advised that the above name patient will require a short-term nursing home stay--anticipated 30 days or less rehabilitation and strengthening. The plan is for return home.

## 2020-07-09 NOTE — Progress Notes (Signed)
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  Patient was having left arm pain for the last week, she had to shorten her hemodialysis sessions last 2 times.  Also complaining of shortness of breath.  In the emergency department she was febrile.  Patient was 87% on room air.  In the ER with leukocytosis, CT scan abdomen pelvis with new bibasilar airspace disease concerning for pneumonia and mild left-sided collecting system dilatation with asymmetrical fatty stranding about the left ureter with concern for ascending UTI.  Patient occasionally makes urine.  She was started on treatment with antibiotics for pneumonia and admitted to the hospital. Blood cultures growing MRSA bacteremia.   Assessment & Plan:   Principal Problem:   CAP (community acquired pneumonia) Active Problems:   Morbid obesity ([place])   Essential hypertension   GERD   Sleep apnea   Type 2 diabetes mellitus treated with insulin (HCC)   Hyperglycemia due to diabetes mellitus ([place])   Bipolar 1 disorder (HCC)   ESRD (end stage renal disease) on dialysis ([place])   UTI (urinary tract infection)   Hypoalbuminemia   Acute respiratory failure with hypoxia (HCC)  MRSA bacteremia , community acquired pneumonia suspected MRSA pneumonia with acute hypoxemic respiratory failure:  Continue vancomycin, dosed by pharmacy with levels. Blood cultures 12/9 with MRSA Blood cultures 12/11 drawn, repeat blood cultures negative. 2D echocardiogram, no evidence of endocarditis. Painful left wrist and forearm, CT scan with contrast with no evidence of myositis or osteomyelitis. AV fistula with no evidence of infection. Followed by  ID. Discussed with infectious disease, patient will need 6 weeks of IV vancomycin with dialysis. Discussed with nephrology, they will arrange vancomycin infusion with hemodialysis.  Presumed UTI: Ruled out.  Hyperglycemia secondary to type 2 diabetes: On insulin that is continued.  ESRD on hemodialysis: On a schedule hemodialysis.  Using left arm fistula.  Functioning well.  Left forearm pain and swelling: No evidence of ischemia, vascularity is intact.  No local erythema but diffuse swelling.  Denies trauma.  CT scan with contrast with no evidence of myositis, abscess or bony abnormalities.  May have gouty arthritis.  No evidence of abscess or synovial effusion. Colchicine 0.6 mg given on 12/11 with some improvement.  Trying prednisone.  Repeat colchicine today.  Sleep apnea: Using CPAP.  Physical deconditioning/severe medical conditions: Work with PT OT before planning discharge.  May need to go to SNF.   DVT prophylaxis: heparin injection 5,000 Units Start: 07/05/20 2330 SCDs Start: 07/05/20 2316   Code Status: Full code Family Communication: Patient's daughter on the phone 12/11. Disposition Plan: Status is: Inpatient  Remains inpatient appropriate because:Inpatient level of care appropriate due to severity of illness   Dispo: The patient is from: Home              Anticipated d/c is to: Home with home health versus SNF.              Anticipated d/c date is: Architectural technologist.              Patient currently is not medically stable to d/c.         Consultants:   Nephrology  ID  Procedures:  Hemodialysis  Antimicrobials:  Anti-infectives (From admission, onward)   Start     Dose/Rate Route Frequency Ordered Stop   07/09/20 1200  vancomycin (VANCOCIN) IVPB 1000 mg/200 mL premix        1,000 mg 200 mL/hr over 60 Minutes Intravenous Every M-W-F (Hemodialysis) 07/06/20 1129     07/06/20 2200  cefTRIAXone (ROCEPHIN) 1 g in sodium chloride 0.9 % 100 mL IVPB  Status:   Discontinued        1 g 200 mL/hr over 30 Minutes Intravenous Every 24 hours 07/06/20 0109 07/06/20 1041   07/06/20 2200  azithromycin (ZITHROMAX) 500 mg in sodium chloride 0.9 % 250 mL IVPB  Status:  Discontinued        500 mg 250 mL/hr over 60 Minutes Intravenous Every 24 hours 07/06/20 0109 07/07/20 0938   07/06/20 2000  vancomycin (VANCOCIN) IVPB 1000 mg/200 mL premix        1,000 mg 200 mL/hr over 60 Minutes Intravenous  Once 07/06/20 1413 07/06/20 2025   07/06/20 1800  vancomycin (VANCOREADY) IVPB 2000 mg/400 mL  Status:  Discontinued        2,000 mg 200 mL/hr over 120 Minutes Intravenous  Once 07/06/20 1212 07/06/20 1340   07/06/20 1330  vancomycin (VANCOREADY) IVPB 2000 mg/400 mL        2,000 mg 200 mL/hr over 120 Minutes Intravenous  Once 07/06/20 1340 07/06/20 1437   07/06/20 1100  vancomycin (VANCOREADY) IVPB 2000 mg/400 mL  Status:  Discontinued        2,000 mg 200 mL/hr over 120 Minutes Intravenous  Once 07/06/20 1035 07/06/20 1212   07/05/20 2130  cefTRIAXone (ROCEPHIN) 1 g in sodium chloride 0.9 % 100 mL IVPB        1 g 200 mL/hr over 30 Minutes Intravenous  Once 07/05/20 2120 07/06/20 0005   07/05/20 2130  azithromycin (ZITHROMAX) tablet 500 mg        500 mg Oral  Once 07/05/20 2120 07/05/20 2336         Subjective: Patient seen and examined.  No overnight events.  Left wrist is still painful, however slightly better than before.  She could hardly walk today and feels extremely weak.  Objective: Vitals:   07/09/20 1330 07/09/20 1345 07/09/20 1400 07/09/20 1415  BP:  (!) 144/65 (!) 109/52 137/63  Pulse:      Resp: 17 16 19 20   Temp:      TempSrc:      SpO2:      Weight:      Height:       No intake or output data in the 24 hours ending 07/09/20 1434 Filed Weights   07/06/20 2045 07/08/20 0444 07/09/20 1140  Weight: (!) 141.4 kg 131.5 kg (!) 141.5 kg    Examination:  General exam: Chronically sick looking.  Comfortable at rest. She is legally blind.   Does not see waving of hands. Respiratory system: Clear to auscultation. Respiratory effort normal.  Mostly conducted airway sounds.  Difficult to auscultate. Cardiovascular system: S1 & S2 heard, RRR.  Gastrointestinal system: Obese and pendulous.  Nontender.  Bowel sounds present. Central nervous system: Alert and oriented. No focal neurological deficits. Extremities: Symmetric 5 x 5 power. Left upper extremity AV fistula present with thrill.  Nontender at the fistula. Tenderness with no swelling erythema, mild swelling on the dorsum of the forearm.   Data Reviewed: I have personally reviewed following labs and imaging studies  CBC: Recent Labs  Lab  07/05/20 1813 07/06/20 0500 07/07/20 0334 07/08/20 0356 07/09/20 0451  WBC 11.9* 11.2* 10.8* 10.5 11.9*  NEUTROABS 9.7*  --  8.7* 8.5* 9.9*  HGB 11.1* 10.7* 9.7* 9.8* 9.6*  HCT 36.4 35.2* 31.5* 32.8* 31.4*  MCV 96.8 95.9 96.3 98.2 96.3  PLT 225 234 202 199 425   Basic Metabolic Panel: Recent Labs  Lab 07/05/20 1813 07/06/20 0500 07/07/20 0334 07/08/20 0356 07/09/20 0451  NA 132* 134* 132* 132* 132*  K 4.6 4.6 3.8 4.3 4.9  CL 91* 92* 91* 91* 89*  CO2 22 23 28 26 25   GLUCOSE 158* 130* 167* 119* 121*  BUN 57* 68* 35* 55* 72*  CREATININE 12.35* 13.55* 7.46* 10.15* 11.80*  CALCIUM 8.3* 8.8* 8.5* 8.5* 8.6*  MG  --  1.9 1.8  --   --   PHOS  --  6.5*  --   --   --    GFR: Estimated Creatinine Clearance: 8.2 mL/min (A) (by C-G formula based on SCr of 11.8 mg/dL (H)). Liver Function Tests: Recent Labs  Lab 07/05/20 1813 07/06/20 0500  AST 29 16  ALT 38 26  ALKPHOS 146* 130*  BILITOT 1.6* 0.8  PROT 8.1 7.7  ALBUMIN 3.3* 3.0*   Recent Labs  Lab 07/05/20 1813  LIPASE 23   No results for input(s): AMMONIA in the last 168 hours. Coagulation Profile: Recent Labs  Lab 07/05/20 1813 07/06/20 0500  INR 1.3* 1.3*   Cardiac Enzymes: No results for input(s): CKTOTAL, CKMB, CKMBINDEX, TROPONINI in the last 168  hours. BNP (last 3 results) No results for input(s): PROBNP in the last 8760 hours. HbA1C: No results for input(s): HGBA1C in the last 72 hours. CBG: Recent Labs  Lab 07/08/20 1236 07/08/20 1559 07/08/20 2016 07/09/20 0729 07/09/20 1126  GLUCAP 116* 114* 111* 114* 122*   Lipid Profile: No results for input(s): CHOL, HDL, LDLCALC, TRIG, CHOLHDL, LDLDIRECT in the last 72 hours. Thyroid Function Tests: No results for input(s): TSH, T4TOTAL, FREET4, T3FREE, THYROIDAB in the last 72 hours. Anemia Panel: No results for input(s): VITAMINB12, FOLATE, FERRITIN, TIBC, IRON, RETICCTPCT in the last 72 hours. Sepsis Labs: Recent Labs  Lab 07/05/20 1813 07/05/20 2009  LATICACIDVEN 1.6 0.9    Recent Results (from the past 240 hour(s))  Resp Panel by RT-PCR (Flu A&B, Covid) Nasopharyngeal Swab     Status: None   Collection Time: 07/02/20 10:05 AM   Specimen: Nasopharyngeal Swab; Nasopharyngeal(NP) swabs in vial transport medium  Result Value Ref Range Status   SARS Coronavirus 2 by RT PCR NEGATIVE NEGATIVE Final    Comment: (NOTE) SARS-CoV-2 target nucleic acids are NOT DETECTED.  The SARS-CoV-2 RNA is generally detectable in upper respiratory specimens during the acute phase of infection. The lowest concentration of SARS-CoV-2 viral copies this assay can detect is 138 copies/mL. A negative result does not preclude SARS-Cov-2 infection and should not be used as the sole basis for treatment or other patient management decisions. A negative result may occur with  improper specimen collection/handling, submission of specimen other than nasopharyngeal swab, presence of viral mutation(s) within the areas targeted by this assay, and inadequate number of viral copies(<138 copies/mL). A negative result must be combined with clinical observations, patient history, and epidemiological information. The expected result is Negative.  Fact Sheet for Patients:   EntrepreneurPulse.com.au  Fact Sheet for Healthcare Providers:  IncredibleEmployment.be  This test is no t yet approved or cleared by the [place] FDA and  has been authorized for detection  and/or diagnosis of SARS-CoV-2 by FDA under an Emergency Use Authorization (EUA). This EUA will remain  in effect (meaning this test can be used) for the duration of the COVID-19 declaration under Section 564(b)(1) of the Act, 21 U.S.C.section 360bbb-3(b)(1), unless the authorization is terminated  or revoked sooner.       Influenza A by PCR NEGATIVE NEGATIVE Final   Influenza B by PCR NEGATIVE NEGATIVE Final    Comment: (NOTE) The Xpert Xpress SARS-CoV-2/FLU/RSV plus assay is intended as an aid in the diagnosis of influenza from Nasopharyngeal swab specimens and should not be used as a sole basis for treatment. Nasal washings and aspirates are unacceptable for Xpert Xpress SARS-CoV-2/FLU/RSV testing.  Fact Sheet for Patients: EntrepreneurPulse.com.au  Fact Sheet for Healthcare Providers: IncredibleEmployment.be  This test is not yet approved or cleared by the [place] FDA and has been authorized for detection and/or diagnosis of SARS-CoV-2 by FDA under an Emergency Use Authorization (EUA). This EUA will remain in effect (meaning this test can be used) for the duration of the COVID-19 declaration under Section 564(b)(1) of the Act, 21 U.S.C. section 360bbb-3(b)(1), unless the authorization is terminated or revoked.  Performed at [facility], [address], [place], [place] [address]   Culture, blood (single)     Status: Abnormal   Collection Time: 07/05/20  6:11 PM   Specimen: BLOOD  Result Value Ref Range Status   Specimen Description   Final    BLOOD BLOOD RIGHT WRIST Performed at [facility], [address], [place], [place] [address]    Special Requests   Final    BOTTLES DRAWN AEROBIC AND  ANAEROBIC Blood Culture results may not be optimal due to an inadequate volume of blood received in culture bottles Performed at [facility], [address], [place], [place] [address]    Culture  Setup Time   Final    GRAM POSITIVE COCCI ANAEROBIC AND AEROBIC BOTTLES  Gram Stain Report Called to,Read Back By and Verified With: LOOMIS,K@1150  BY MATTHEWS,B12.10.21 CRITICAL VALUE NOTED.  VALUE IS CONSISTENT WITH PREVIOUSLY REPORTED AND CALLED VALUE.    Culture (A)  Final    STAPHYLOCOCCUS AUREUS SUSCEPTIBILITIES PERFORMED ON PREVIOUS CULTURE WITHIN THE LAST 5 DAYS. Performed at [facility], [place] [address] [place], [place] [address]    Report Status 07/08/2020 FINAL  Final  Blood culture (routine single)     Status: Abnormal   Collection Time: 07/05/20  6:13 PM   Specimen: BLOOD RIGHT WRIST  Result Value Ref Range Status   Specimen Description   Final    BLOOD RIGHT WRIST Performed at [facility], [address] [place], [place] [address]    Special Requests   Final    BOTTLES DRAWN AEROBIC AND ANAEROBIC Blood Culture results may not be optimal due to an inadequate volume of blood received in culture bottles Performed at [facility] [place], [place] [address]    Culture  Setup Time   Final    GRAM POSITIVE COCCI BOTH ANAEROBIC AND AEROBIC BOTTLES Gram Stain Report Called to,Read Back By and Verified With: PARNELL,R @0945  BY MATTHEWS B 12.10.21 CRITICAL RESULT CALLED TO, READ BACK BY AND VERIFIED WITH: PHARMD L POOLE [phone] FCP Performed at [facility], [place] [address], [place],  [address]    Culture METHICILLIN RESISTANT STAPHYLOCOCCUS AUREUS (A)  Final   Report Status 07/08/2020 FINAL  Final   Organism ID, Bacteria METHICILLIN RESISTANT STAPHYLOCOCCUS AUREUS  Final  Susceptibility   Methicillin resistant staphylococcus aureus - MIC*    CIPROFLOXACIN >=8 RESISTANT Resistant     ERYTHROMYCIN >=8 RESISTANT Resistant     GENTAMICIN  <=0.5 SENSITIVE Sensitive     OXACILLIN >=4 RESISTANT Resistant     TETRACYCLINE <=1 SENSITIVE Sensitive     VANCOMYCIN <=0.5 SENSITIVE Sensitive     TRIMETH/SULFA <=10 SENSITIVE Sensitive     CLINDAMYCIN <=0.25 SENSITIVE Sensitive     RIFAMPIN <=0.5 SENSITIVE Sensitive     Inducible Clindamycin NEGATIVE Sensitive     * METHICILLIN RESISTANT STAPHYLOCOCCUS AUREUS  Blood Culture ID Panel (Reflexed)     Status: Abnormal   Collection Time: 07/05/20  6:13 PM  Result Value Ref Range Status   Enterococcus faecalis NOT DETECTED NOT DETECTED Final   Enterococcus Faecium NOT DETECTED NOT DETECTED Final   Listeria monocytogenes NOT DETECTED NOT DETECTED Final   Staphylococcus species DETECTED (A) NOT DETECTED Final    Comment: CRITICAL RESULT CALLED TO, READ BACK BY AND VERIFIED WITH: PHARMD L POOLE [phone] FCP    Staphylococcus aureus (BCID) DETECTED (A) NOT DETECTED Final    Comment: Methicillin (oxacillin)-resistant Staphylococcus aureus (MRSA). MRSA is predictably resistant to beta-lactam antibiotics (except ceftaroline). Preferred therapy is vancomycin unless clinically contraindicated. Patient requires contact precautions if  hospitalized. CRITICAL RESULT CALLED TO, READ BACK BY AND VERIFIED WITH: PHARMD L POOLE [phone] [id number] FCP    Staphylococcus epidermidis NOT DETECTED NOT DETECTED Final   Staphylococcus lugdunensis NOT DETECTED NOT DETECTED Final   Streptococcus species NOT DETECTED NOT DETECTED Final   Streptococcus agalactiae NOT DETECTED NOT DETECTED Final   Streptococcus pneumoniae NOT DETECTED NOT DETECTED Final   Streptococcus pyogenes NOT DETECTED NOT DETECTED Final   A.calcoaceticus-baumannii NOT DETECTED NOT DETECTED Final   Bacteroides fragilis NOT DETECTED NOT DETECTED Final   Enterobacterales NOT DETECTED NOT DETECTED Final   Enterobacter cloacae complex NOT DETECTED NOT DETECTED Final   Escherichia coli NOT DETECTED NOT DETECTED Final   Klebsiella aerogenes NOT  DETECTED NOT DETECTED Final   Klebsiella oxytoca NOT DETECTED NOT DETECTED Final   Klebsiella pneumoniae NOT DETECTED NOT DETECTED Final   Proteus species NOT DETECTED NOT DETECTED Final   Salmonella species NOT DETECTED NOT DETECTED Final   Serratia marcescens NOT DETECTED NOT DETECTED Final   Haemophilus influenzae NOT DETECTED NOT DETECTED Final   Neisseria meningitidis NOT DETECTED NOT DETECTED Final   Pseudomonas aeruginosa NOT DETECTED NOT DETECTED Final   Stenotrophomonas maltophilia NOT DETECTED NOT DETECTED Final   Candida albicans NOT DETECTED NOT DETECTED Final   Candida auris NOT DETECTED NOT DETECTED Final   Candida glabrata NOT DETECTED NOT DETECTED Final   Candida krusei NOT DETECTED NOT DETECTED Final   Candida parapsilosis NOT DETECTED NOT DETECTED Final   Candida tropicalis NOT DETECTED NOT DETECTED Final   Cryptococcus neoformans/gattii NOT DETECTED NOT DETECTED Final   Meth resistant mecA/C and MREJ DETECTED (A) NOT DETECTED Final    Comment: CRITICAL RESULT CALLED TO, READ BACK BY AND VERIFIED WITH: PHARMD L POOLE [phone] FCP Performed at [facility], [address], [place], [place] [address]   Resp Panel by RT-PCR (Flu A&B, Covid) Nasopharyngeal Swab     Status: None   Collection Time: 07/05/20  9:49 PM   Specimen: Nasopharyngeal Swab; Nasopharyngeal(NP) swabs in vial transport medium  Result Value Ref Range Status   SARS Coronavirus 2 by RT PCR NEGATIVE NEGATIVE Final    Comment: (NOTE) SARS-CoV-2 target nucleic acids are  NOT DETECTED.  The SARS-CoV-2 RNA is generally detectable in upper respiratory specimens during the acute phase of infection. The lowest concentration of SARS-CoV-2 viral copies this assay can detect is 138 copies/mL. A negative result does not preclude SARS-Cov-2 infection and should not be used as the sole basis for treatment or other patient management decisions. A negative result may occur with  improper specimen  collection/handling, submission of specimen other than nasopharyngeal swab, presence of viral mutation(s) within the areas targeted by this assay, and inadequate number of viral copies(<138 copies/mL). A negative result must be combined with clinical observations, patient history, and epidemiological information. The expected result is Negative.  Fact Sheet for Patients:  EntrepreneurPulse.com.au  Fact Sheet for Healthcare Providers:  IncredibleEmployment.be  This test is no t yet approved or cleared by the [place] FDA and  has been authorized for detection and/or diagnosis of SARS-CoV-2 by FDA under an Emergency Use Authorization (EUA). This EUA will remain  in effect (meaning this test can be used) for the duration of the COVID-19 declaration under Section 564(b)(1) of the Act, 21 U.S.C.section 360bbb-3(b)(1), unless the authorization is terminated  or revoked sooner.       Influenza A by PCR NEGATIVE NEGATIVE Final   Influenza B by PCR NEGATIVE NEGATIVE Final    Comment: (NOTE) The Xpert Xpress SARS-CoV-2/FLU/RSV plus assay is intended as an aid in the diagnosis of influenza from Nasopharyngeal swab specimens and should not be used as a sole basis for treatment. Nasal washings and aspirates are unacceptable for Xpert Xpress SARS-CoV-2/FLU/RSV testing.  Fact Sheet for Patients: EntrepreneurPulse.com.au  Fact Sheet for Healthcare Providers: IncredibleEmployment.be  This test is not yet approved or cleared by the [place] FDA and has been authorized for detection and/or diagnosis of SARS-CoV-2 by FDA under an Emergency Use Authorization (EUA). This EUA will remain in effect (meaning this test can be used) for the duration of the COVID-19 declaration under Section 564(b)(1) of the Act, 21 U.S.C. section 360bbb-3(b)(1), unless the authorization is terminated or revoked.  Performed at [facility], [address] [place], [place] [address]   MRSA PCR Screening     Status: Abnormal   Collection Time: 07/06/20 11:31 AM   Specimen: Nasal Mucosa; Nasopharyngeal  Result Value Ref Range Status   MRSA by PCR POSITIVE (A) NEGATIVE Final    Comment:        The GeneXpert MRSA Assay (FDA approved for NASAL specimens only), is one component of a comprehensive MRSA colonization surveillance program. It is not intended to diagnose MRSA infection nor to guide or monitor treatment for MRSA infections. RESULT CALLED TO, READ BACK BY AND VERIFIED WITH: LOOMIS K. @ 2094 ON [id number] BY HENDERSON L. Performed at [facility], [address], [place], [place] [address]   Culture, blood (routine x 2)     Status: None (Preliminary result)   Collection Time: 07/07/20  8:59 AM   Specimen: BLOOD RIGHT HAND  Result Value Ref Range Status   Specimen Description   Final    BLOOD RIGHT HAND BOTTLES DRAWN AEROBIC AND ANAEROBIC   Special Requests Blood Culture adequate volume  Final   Culture   Final    NO GROWTH 2 DAYS Performed at [facility], [address] [place], [place] [address]    Report Status PENDING  Incomplete         Radiology Studies: No results found.      Scheduled Meds: . atorvastatin  20 mg Oral Daily  .  Chlorhexidine Gluconate Cloth  6 each Topical Q0600  . [START ON 07/11/2020] darbepoetin (ARANESP) injection - DIALYSIS  25 mcg Intravenous Q Wed-HD  . dextromethorphan-guaiFENesin  1 tablet Oral BID  . feeding supplement (GLUCERNA SHAKE)  237 mL Oral TID BM  . gabapentin  100 mg Oral QHS  . heparin  5,000 Units Subcutaneous Q8H  . insulin aspart  0-5 Units Subcutaneous QHS  . insulin aspart  0-6 Units Subcutaneous TID WC  . mouth rinse  15 mL Mouth Rinse BID  . mupirocin ointment  1 application Nasal BID  . pantoprazole  40 mg Oral Daily  . PARoxetine  20 mg Oral QPM  . predniSONE  40 mg Oral Q breakfast  . sevelamer carbonate  800 mg Oral TID WC    Continuous Infusions: . sodium chloride    . sodium chloride    . vancomycin       LOS: 4 days    Time spent: 30 minutes    Barb Merino, MD [facility] Pager [phone]

## 2020-07-09 NOTE — Evaluation (Signed)
Physical Therapy Evaluation Patient Details Name: Martha [name] MRN: [id number] DOB: 25-Jun-1966 Today's Date: 07/09/2020   History of Present Illness  Martha [name] is a 54 y.o. female with medical history significant for hypertension, ESRD on HD (MWF), T2 DM, degenerative disc disease, COPD, CHF, carpal tunnel syndrome, legally blind, bipolar, arthritis, anemia presents to the ED with complaint of left arm which has been ongoing since Monday she presents to [facility] due to same complaint on 12/6 last dialysis was on Wednesday 12/8 during which she only completed three fourths of her session due to left arm pain.  She also complained of back pain and leg pain.  Patient also complained of increased shortness of breath that has been ongoing for several days, she denies cough, fever, chest pain or any known sick contacts.    Clinical Impression  Patient very unsteady on feet with near loss of balance during transfer to chair, unable to ambulate away from bedside due to fall risk and patient very anxious.  Patient desaturates from 93% to low 80's on room air, required the use of 2 LPM to maintain SpO2 over 90% and tolerated sitting up in chair after therapy - RN notified.  Patient will benefit from continued physical therapy in hospital and recommended venue below to increase strength, balance, endurance for safe ADLs and gait.    Follow Up Recommendations SNF    Equipment Recommendations  None recommended by PT    Recommendations for Other Services       Precautions / Restrictions Precautions Precautions: Fall Restrictions Weight Bearing Restrictions: No      Mobility  Bed Mobility Overal bed mobility: Needs Assistance Bed Mobility: Sit to Supine       Sit to supine: Supervision   General bed mobility comments: increased time, labored movement    Transfers Overall transfer level: Needs assistance Equipment used: Rolling walker (2 wheeled) Transfers: Sit to/from  [facility] Sit to Stand: Min assist;Mod assist Stand pivot transfers: Min assist;Mod assist       General transfer comment: slow labored movement, difficulty for sit to stands due to BLE weakness  Ambulation/Gait Ambulation/Gait assistance: Mod assist Gait Distance (Feet): 5 Feet Assistive device: Rolling walker (2 wheeled) Gait Pattern/deviations: Decreased step length - right;Decreased step length - left;Decreased stride length Gait velocity: decreased   General Gait Details: limited to 5-6 slow labored unsteady steps with SpO2 dropping from 93% to 85% on room air, limited due to fatigue and poor standing balance  Stairs            Wheelchair Mobility    Modified Rankin (Stroke Patients Only)       Balance Overall balance assessment: Needs assistance Sitting-balance support: Feet supported;No upper extremity supported Sitting balance-Leahy Scale: Fair Sitting balance - Comments: fair/good seated at EOB   Standing balance support: During functional activity;Bilateral upper extremity supported Standing balance-Leahy Scale: Poor Standing balance comment: using heavy duty RW                             Pertinent Vitals/Pain Pain Assessment: No/denies pain    Home Living Family/patient expects to be discharged to:: Private residence Living Arrangements: Alone Available Help at Discharge: Personal care attendant Type of Home: Apartment Home Access: Ramped entrance     Home Layout: One level Home Equipment: Shower seat;Bedside commode;Grab bars - tub/shower;Wheelchair - manual      Prior Function Level of Independence: Needs assistance  Gait / Transfers Assistance Needed: Household and short distanced Electronics engineer  ADL's / Homemaking Assistance Needed: home aide M-F 6 hours/day x 5 days/week, 3 to 3 1/2 hours/day on weekends        Hand Dominance   Dominant Hand: Right    Extremity/Trunk Assessment    Upper Extremity Assessment Upper Extremity Assessment: Generalized weakness    Lower Extremity Assessment Lower Extremity Assessment: Generalized weakness    Cervical / Trunk Assessment Cervical / Trunk Assessment: Normal  Communication   Communication: No difficulties  Cognition Arousal/Alertness: Awake/alert Behavior During Therapy: WFL for tasks assessed/performed Overall Cognitive Status: Within Functional Limits for tasks assessed                                        General Comments      Exercises     Assessment/Plan    PT Assessment Patient needs continued PT services  PT Problem List Decreased strength;Decreased activity tolerance;Decreased balance;Decreased mobility       PT Treatment Interventions Gait training;DME instruction;Stair training;Functional mobility training;Therapeutic activities;Patient/family education;Therapeutic exercise;Balance training    PT Goals (Current goals can be found in the Care Plan section)  Acute Rehab PT Goals Patient Stated Goal: return home with caregivers to assist PT Goal Formulation: With patient Time For Goal Achievement: 07/23/20 Potential to Achieve Goals: Good    Frequency Min 3X/week   Barriers to discharge        Co-evaluation               AM-PAC PT "6 Clicks" Mobility  Outcome Measure Help needed turning from your back to your side while in a flat bed without using bedrails?: None Help needed moving from lying on your back to sitting on the side of a flat bed without using bedrails?: A Little Help needed moving to and from a bed to a chair (including a wheelchair)?: A Lot Help needed standing up from a chair using your arms (e.g., wheelchair or bedside chair)?: A Lot Help needed to walk in hospital room?: A Lot Help needed climbing 3-5 steps with a railing? : A Lot 6 Click Score: 15    End of Session Equipment Utilized During Treatment: Oxygen Activity Tolerance: Patient  tolerated treatment well;Patient limited by fatigue Patient left: in chair;with call bell/phone within reach Nurse Communication: Mobility status PT Visit Diagnosis: Unsteadiness on feet (R26.81);Other abnormalities of gait and mobility (R26.89);Muscle weakness (generalized) (M62.81)    Time: 1020-1053 PT Time Calculation (min) (ACUTE ONLY): 33 min   Charges:   PT Evaluation $PT Eval Moderate Complexity: 1 Mod PT Treatments $Therapeutic Activity: 23-37 mins        3:09 PM, 07/09/20 Lonell Grandchild, MPT Physical Therapist with [facility] [phone] office [phone] mobile phone

## 2020-07-09 NOTE — Progress Notes (Signed)
PHARMACY CONSULT NOTE FOR:  OUTPATIENT  PARENTERAL ANTIBIOTIC THERAPY (OPAT)  Indication: MRSA Bacteremia Regimen: Vancomycin 1gm after Every HD on MWF End date: 08/17/20  IV antibiotic discharge orders are pended. To discharging provider:  please sign these orders via discharge navigator,  Select New Orders & click on the button choice - Manage This Unsigned Work.     Thank you for allowing pharmacy to be a part of this patient's care.  Isac Sarna L 07/09/2020, 11:19 AM

## 2020-07-09 NOTE — TOC Initial Note (Signed)
Transition of Care [facility]) - Initial/Assessment Note   Patient Details  Name: Martha [name] MRN: [id number] Date of Birth: 1965/12/20  Transition of [facility]) CM/SW Contact:    Sherie Don, LCSW Phone Number: 07/09/2020, 3:28 PM  Clinical Narrative: Patient is a 54 year old female who was admitted for CAP. Readmission checklist completed due to high readmission score. PT evaluation recommended SNF.  CSW met with patient to discuss SNF and patient is agreeable, but would prefer a SNF in the [place] area if possible. FL2 completed; PASRR pending and documents uploaded to [facility] MUST. Patient faxed out. TOC awaiting bed offers.  Expected Discharge Plan: Skilled Nursing Facility Barriers to Discharge: Continued Medical Work up  Patient Goals and CMS Choice Patient states their goals for this hospitalization and ongoing recovery are:: Got to rehab CMS Medicare.gov Compare Post Acute Care list provided to:: Patient Choice offered to / list presented to : Patient  Expected Discharge Plan and Services Expected Discharge Plan: [place] In-house Referral: Clinical Social Work Discharge Planning Services: NA Post Acute Care Choice: [place] Living arrangements for the past 2 months: Apartment             DME Arranged: N/A DME Agency: NA HH Arranged: NA [place] Agency: NA  Prior Living Arrangements/Services Living arrangements for the past 2 months: Apartment Lives with:: Self Patient language and need for interpreter reviewed:: Yes Do you feel safe going back to the place where you live?: Yes      Need for Family Participation in Patient Care: Yes (Comment) Care giver support system in place?: Yes (comment) Current home services: DME Criminal Activity/Legal Involvement Pertinent to Current Situation/Hospitalization: No - Comment as needed  Activities of Daily Living Home Assistive Devices/Equipment: Gilford Rile (specify type) ADL Screening (condition at time of  admission) Patient's cognitive ability adequate to safely complete daily activities?: Yes Is the patient deaf or have difficulty hearing?: No Does the patient have difficulty seeing, even when wearing glasses/contacts?: Yes Does the patient have difficulty concentrating, remembering, or making decisions?: No Patient able to express need for assistance with ADLs?: Yes Does the patient have difficulty dressing or bathing?: No Independently performs ADLs?: Yes (appropriate for developmental age) Does the patient have difficulty walking or climbing stairs?: No Weakness of Legs: Both Weakness of Arms/Hands: None  Permission Sought/Granted Permission sought to share information with : Facility Art therapist granted to share information with : Yes, Verbal Permission Granted Permission granted to share info w AGENCY: SNFs  Emotional Assessment Appearance:: Appears stated age,Disheveled Attitude/Demeanor/Rapport: Engaged Affect (typically observed): Accepting Orientation: : Oriented to Self,Oriented to Place,Oriented to  Time,Oriented to Situation Alcohol / Substance Use: Not Applicable Psych Involvement: No (comment)  Admission diagnosis:  Hypoxemia [R09.02] CAP (community acquired pneumonia) [J18.9] Community acquired pneumonia, unspecified laterality [J18.9] Patient Active Problem List   Diagnosis Date Noted  . Hypoalbuminemia 07/06/2020  . Acute respiratory failure with hypoxia ([place]) 07/06/2020  . CAP (community acquired pneumonia) 07/05/2020  . Trimalleolar fracture of ankle, closed, right, initial encounter 12/17/2018  . Pain and swelling of ankle, right 12/16/2018  . Metabolic encephalopathy 44/92/0100  . SIRS (systemic inflammatory response syndrome) ([place]) 06/23/2018  . UTI (urinary tract infection) 02/26/2018  . Volume overload 02/25/2018  . 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 Dialysis patient ([place]) 12/13/2017  . ESRD (end stage renal  disease) on dialysis ([place])   . Chronic diarrhea 10/08/2017  . Anemia in chronic kidney disease 05/24/2017  .  Chronic obstructive pulmonary disease ([place]) 05/24/2017  . Heart failure ([place]) 05/24/2017  . 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 Renal failure (ARF), acute on chronic (HCC) 04/22/2017  . Anasarca associated with disorder of kidney 04/20/2017  . Protein-calorie malnutrition, severe 11/29/2016  . HTN (hypertension), malignant 11/20/2016  . Bipolar 1 disorder ([place]) 11/20/2016  . Noncompliance with medication regimen 11/20/2016  . Panic attack 11/20/2016  . Uncontrolled type 2 diabetes mellitus with hyperglycemia, without long-term current use of insulin ([place]) 10/27/2016  . Sensory disturbance 10/27/2016  . Left hemiparesis ([place]) 10/27/2016  . Cholelithiasis 04/26/2014  . Hyponatremia 04/26/2014  . Gallstones 04/06/2014  . Diabetic ulcer of left great toe ([place]) 10/09/2013  . Type 2 diabetes mellitus treated with insulin ([place]) 05/30/2013  . Hyperglycemia due to diabetes mellitus ([place]) 05/30/2013  . Internal hemorrhoids with other complication 87/68/1157  . Umbilical hernia without mention of obstruction or gangrene 04/15/2013  . Anemia 04/15/2013  . Morbid obesity with BMI of 50.0-59.9, adult ([place]) 10/08/2012  . DM 05/13/2010  . Hyperlipidemia 05/13/2010  . Morbid obesity ([place]) 05/13/2010  . Depression with anxiety 05/13/2010  . RESTLESS LEG SYNDROME 05/13/2010  . Essential hypertension 05/13/2010  . GERD 05/13/2010  . RECTAL BLEEDING 05/13/2010  . Sleep apnea 05/13/2010  . Dysphagia, oropharyngeal phase 05/13/2010   PCP:  Emelia Loron, NP Pharmacy:   [facility], [place] [address] [place] [address] Phone: [phone] Fax: [phone]  Readmission Risk Interventions Readmission Risk Prevention Plan 07/09/2020  Transportation Screening Complete  Medication Review (RN Care Manager)  Complete  PCP or Specialist appointment within 3-5 days of discharge Not Complete  PCP/Specialist Appt Not Complete comments Discharging to SNF  [place] or Home Care Consult Complete  SW Recovery Care/Counseling Consult Complete  Palliative Care Screening Not Applicable  Skilled Nursing Facility Complete  Some recent data might be hidden

## 2020-07-09 NOTE — Procedures (Signed)
HEMODIALYSIS TREATMENT NOTE:  3.5 hour heparin-free HD completed via LUE AVF (16g/antegrade). Goal met: 1.8 liters removed with minimal interruption in UF.  All blood was returned and hemostasis was achieved in 15 minutes.  No changes from pre-HD assessment.   , RN 

## 2020-07-09 NOTE — Progress Notes (Signed)
Pharmacy Antibiotic Note  Martha [name] is a 54 y.o. female admitted on 07/05/2020 with bacteremia.  Pharmacy has been consulted for Vancomycin dosing. Tmax 100.5 D#3 of tx, MRSA Bacteremia. ECHO shows no e/o vegetation. F/U imaging for LOT, repeat BCX are ngtd.  Plan: Continue Vancomycin 1000mg  after every HD session on MWF Vancomycin levels at S/S F/U cxs and clinical progress Monitor V/S, labs, and levels as indicated  Height: 5\' 7"  (170.2 cm) Weight: 131.5 kg (289 lb 14.5 oz) IBW/kg (Calculated) : 61.6  Temp (24hrs), Avg:98.4 F (36.9 C), Min:97.3 F (36.3 C), Max:100.5 F (38.1 C)  Recent Labs  Lab 07/05/20 1813 07/05/20 2009 07/06/20 0500 07/07/20 0334 07/08/20 0356 07/09/20 0451  WBC 11.9*  --  11.2* 10.8* 10.5 11.9*  CREATININE 12.35*  --  13.55* 7.46* 10.15* 11.80*  LATICACIDVEN 1.6 0.9  --   --   --   --     Estimated Creatinine Clearance: 7.9 mL/min (A) (by C-G formula based on SCr of 11.8 mg/dL (H)).    Allergies  Allergen Reactions  . Codeine Nausea And Vomiting  . Tape Rash and Other (See Comments)    Pull skin off.  Paper tape is ok    Antimicrobials this admission: Azithromycin 12/9  >>12/11 Ceftriaxone 12/9 >> 12/10 Vancomycin 12/10>>  Dose adjustments this admission: prn  Microbiology results: 12/10 BCx: MRSA S- Vancomycin, TCN, septra, clinday R-cipro, oxacillin  12/11 BCX; ngtd 12/9 SARS- 2 CV is negative 12/9 Influenza is negative  12/10 MRSA PCR: positive Thank you for allowing pharmacy to be a part of this patient's care.  Isac Sarna, BS Pharm D, BCPS Clinical Pharmacist 07/09/2020 8:50 AM

## 2020-07-09 NOTE — NC FL2 (Signed)
[place] LEVEL OF CARE SCREENING TOOL     IDENTIFICATION  Patient Name: Martha [name] Birthdate: August 21, 1965 Sex: female Admission Date (Current Location): 07/05/2020  [place] and [place] Number:  [place] [id number] [place] and Address:  [facility] [address] [place]      Provider Number: [id number]  Attending Physician Name and Address:  Barb Merino, MD  Relative Name and Phone Number:  Donavan Foil (son) Ph: [phone]    Current Level of Care: Hospital Recommended Level of Care: [place] Prior Approval Number:    Date Approved/Denied:   PASRR Number:    Discharge Plan: SNF    Current Diagnoses: Patient Active Problem List   Diagnosis Date Noted  . Hypoalbuminemia 07/06/2020  . Acute respiratory failure with hypoxia ([place]) 07/06/2020  . CAP (community acquired pneumonia) 07/05/2020  . Trimalleolar fracture of ankle, closed, right, initial encounter 12/17/2018  . Pain and swelling of ankle, right 12/16/2018  . Metabolic encephalopathy 01/21/9484  . SIRS (systemic inflammatory response syndrome) ([place]) 06/23/2018  . UTI (urinary tract infection) 02/26/2018  . Volume overload 02/25/2018  . 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 Dialysis patient ([place]) 12/13/2017  . ESRD (end stage renal disease) on dialysis ([place])   . Chronic diarrhea 10/08/2017  . Anemia in chronic kidney disease 05/24/2017  . Chronic obstructive pulmonary disease ([place]) 05/24/2017  . Heart failure ([place]) 05/24/2017  . 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 Renal failure (ARF), acute on chronic (HCC) 04/22/2017  . Anasarca associated with disorder of kidney 04/20/2017  . Protein-calorie malnutrition, severe 11/29/2016  . HTN (hypertension), malignant 11/20/2016  . Bipolar 1 disorder ([place]) 11/20/2016  . Noncompliance with medication regimen 11/20/2016  . Panic attack 11/20/2016  .  Uncontrolled type 2 diabetes mellitus with hyperglycemia, without long-term current use of insulin ([place]) 10/27/2016  . Sensory disturbance 10/27/2016  . Left hemiparesis ([place]) 10/27/2016  . Cholelithiasis 04/26/2014  . Hyponatremia 04/26/2014  . Gallstones 04/06/2014  . Diabetic ulcer of left great toe ([place]) 10/09/2013  . Type 2 diabetes mellitus treated with insulin ([place]) 05/30/2013  . Hyperglycemia due to diabetes mellitus ([name]) 05/30/2013  . Internal hemorrhoids with other complication 46/27/0350  . Umbilical hernia without mention of obstruction or gangrene 04/15/2013  . Anemia 04/15/2013  . Morbid obesity with BMI of 50.0-59.9, adult ([place]) 10/08/2012  . DM 05/13/2010  . Hyperlipidemia 05/13/2010  . Morbid obesity ([place]) 05/13/2010  . Depression with anxiety 05/13/2010  . RESTLESS LEG SYNDROME 05/13/2010  . Essential hypertension 05/13/2010  . GERD 05/13/2010  . RECTAL BLEEDING 05/13/2010  . Sleep apnea 05/13/2010  . Dysphagia, oropharyngeal phase 05/13/2010    Orientation RESPIRATION BLADDER Height & Weight     Self,Time,Situation,Place  O2 (2L/min) Incontinent Weight: (!) 311 lb 15.2 oz (141.5 kg) Height:  5\' 7"  (170.2 cm)  BEHAVIORAL SYMPTOMS/MOOD NEUROLOGICAL BOWEL NUTRITION STATUS      Incontinent Diet (Heart healthy/carb modified)  AMBULATORY STATUS COMMUNICATION OF NEEDS Skin   Extensive Assist Verbally Skin abrasions (Abdominal abrasion)                       Personal Care Assistance Level of Assistance  Bathing,Feeding,Dressing Bathing Assistance: Maximum assistance Feeding assistance: Limited assistance Dressing Assistance: Maximum assistance     Functional Limitations Info  Sight,Hearing,Speech Sight Info: Impaired (Patient is blind) Hearing Info: Adequate Speech Info: Adequate    SPECIAL CARE FACTORS FREQUENCY  PT (  By licensed PT)     PT Frequency: 5x's/week              Contractures Contractures Info: Not present    Additional  Factors Info  Code Status,Allergies,Psychotropic Code Status Info: Full Allergies Info: Codeine; Tape Psychotropic Info: Paxil (paroxetine); Neurontin (gabapentin)         Current Medications (07/09/2020):  This is the current hospital active medication list Current Facility-Administered Medications  Medication Dose Route Frequency Provider Last Rate Last Admin  . 0.9 %  sodium chloride infusion  100 mL Intravenous PRN Pearson Grippe B, MD      . 0.9 %  sodium chloride infusion  100 mL Intravenous PRN Pearson Grippe B, MD      . atorvastatin (LIPITOR) tablet 20 mg  20 mg Oral Daily Adefeso, Oladapo, DO   20 mg at 07/09/20 1008  . Chlorhexidine Gluconate Cloth 2 % PADS 6 each  6 each Topical Q0600 Barb Merino, MD   6 each at 07/09/20 0538  . cyclobenzaprine (FLEXERIL) tablet 10 mg  10 mg Oral BID PRN Adefeso, Oladapo, DO   10 mg at 07/08/20 2018  . [START ON 07/11/2020] Darbepoetin Alfa (ARANESP) injection 25 mcg  25 mcg Intravenous Q Wed-HD Barb Merino, MD      . dextromethorphan-guaiFENesin ([place] DM) 30-600 MG per 12 hr tablet 1 tablet  1 tablet Oral BID Adefeso, Oladapo, DO   1 tablet at 07/09/20 1008  . feeding supplement (GLUCERNA SHAKE) (GLUCERNA SHAKE) liquid 237 mL  237 mL Oral TID BM Adefeso, Oladapo, DO   237 mL at 07/07/20 1457  . gabapentin (NEURONTIN) capsule 100 mg  100 mg Oral QHS Barb Merino, MD   100 mg at 07/08/20 2100  . heparin injection 5,000 Units  5,000 Units Subcutaneous Q8H Adefeso, Oladapo, DO   5,000 Units at 07/09/20 0537  . insulin aspart (novoLOG) injection 0-5 Units  0-5 Units Subcutaneous QHS Adefeso, Oladapo, DO   2 Units at 07/06/20 2217  . insulin aspart (novoLOG) injection 0-6 Units  0-6 Units Subcutaneous TID WC Adefeso, Oladapo, DO   2 Units at 07/07/20 0926  . lidocaine (PF) (XYLOCAINE) 1 % injection 5 mL  5 mL Intradermal PRN Pearson Grippe B, MD      . lidocaine-prilocaine (EMLA) cream 1 application  1 application Topical PRN [name],  MD      . MEDLINE mouth rinse  15 mL Mouth Rinse BID Barb Merino, MD   15 mL at 07/09/20 1010  . morphine 2 MG/ML injection 2 mg  2 mg Intravenous Q3H PRN Barb Merino, MD   2 mg at 07/08/20 2013  . mupirocin ointment (BACTROBAN) 2 % 1 application  1 application Nasal BID Barb Merino, MD   1 application at 83/41/96 1010  . ondansetron (ZOFRAN) injection 4 mg  4 mg Intravenous Q6H PRN Barb Merino, MD   4 mg at 07/08/20 2014  . oxyCODONE (Oxy IR/ROXICODONE) immediate release tablet 5 mg  5 mg Oral Q4H PRN Barb Merino, MD   5 mg at 07/08/20 1754  . pantoprazole (PROTONIX) EC tablet 40 mg  40 mg Oral Daily Adefeso, Oladapo, DO   40 mg at 07/09/20 1010  . PARoxetine (PAXIL) tablet 20 mg  20 mg Oral QPM Adefeso, Oladapo, DO   20 mg at 07/08/20 1754  . pentafluoroprop-tetrafluoroeth (GEBAUERS) aerosol 1 application  1 application Topical PRN Pearson Grippe B, MD      . predniSONE (DELTASONE) tablet 40 mg  40 mg Oral Q breakfast Barb Merino, MD   40 mg at 07/09/20 0815  . sevelamer carbonate (RENVELA) tablet 800 mg  800 mg Oral TID WC Rosita Fire, MD   800 mg at 07/09/20 1151  . vancomycin (VANCOCIN) IVPB 1000 mg/200 mL premix  1,000 mg Intravenous Q M,W,F-HD Barb Merino, MD 200 mL/hr at 07/09/20 1415 1,000 mg at 07/09/20 1415     Discharge Medications: Please see discharge summary for a list of discharge medications.  Relevant Imaging Results:  Relevant Lab Results:   Additional Information SSN: [id number] ; ESRD on HD MWF at [facility] in [place]; Patient will need 6 weeks IV antibiotics  Sherie Don, LCSW

## 2020-07-09 NOTE — Progress Notes (Signed)
Pt refused cpap for hs

## 2020-07-10 LAB — GLUCOSE, CAPILLARY
Glucose-Capillary: 148 mg/dL — ABNORMAL HIGH (ref 70–99)
Glucose-Capillary: 136 mg/dL — ABNORMAL HIGH (ref 70–99)
Glucose-Capillary: 216 mg/dL — ABNORMAL HIGH (ref 70–99)
Glucose-Capillary: 210 mg/dL — ABNORMAL HIGH (ref 70–99)

## 2020-07-10 LAB — CBC WITH DIFFERENTIAL/PLATELET
Abs Immature Granulocytes: 0.12 10*3/uL — ABNORMAL HIGH (ref 0.00–0.07)
Basophils Absolute: 0 10*3/uL (ref 0.0–0.1)
Basophils Relative: 0 %
Eosinophils Absolute: 0 10*3/uL (ref 0.0–0.5)
Eosinophils Relative: 0 %
HCT: 33.9 % — ABNORMAL LOW (ref 36.0–46.0)
Hemoglobin: 10.3 g/dL — ABNORMAL LOW (ref 12.0–15.0)
Immature Granulocytes: 1 %
Lymphocytes Relative: 8 %
Lymphs Abs: 0.8 10*3/uL (ref 0.7–4.0)
MCH: 29.1 pg (ref 26.0–34.0)
MCHC: 30.4 g/dL (ref 30.0–36.0)
MCV: 95.8 fL (ref 80.0–100.0)
Monocytes Absolute: 0.3 10*3/uL (ref 0.1–1.0)
Monocytes Relative: 3 %
Neutro Abs: 9.2 10*3/uL — ABNORMAL HIGH (ref 1.7–7.7)
Neutrophils Relative %: 88 %
Platelets: 206 10*3/uL (ref 150–400)
RBC: 3.54 MIL/uL — ABNORMAL LOW (ref 3.87–5.11)
RDW: 16.5 % — ABNORMAL HIGH (ref 11.5–15.5)
WBC: 10.5 10*3/uL (ref 4.0–10.5)
nRBC: 0 % (ref 0.0–0.2)

## 2020-07-10 MED ORDER — PREDNISONE 20 MG PO TABS
40.0000 mg | ORAL_TABLET | Freq: Every day | ORAL | Status: DC
  Administered 2020-07-11 – 2020-07-12 (×2): 40 mg via ORAL
  Filled 2020-07-10 (×3): qty 2

## 2020-07-10 MED ORDER — HYDROCODONE-ACETAMINOPHEN 10-325 MG PO TABS: 1.0000 | ORAL_TABLET | Freq: Four times a day (QID) | ORAL | 0 refills | Status: DC | PRN

## 2020-07-10 MED ORDER — VANCOMYCIN IV (FOR PTA / DISCHARGE USE ONLY): 1000.0000 mg | INTRAVENOUS | 0 refills | Status: AC

## 2020-07-10 MED ORDER — ALPRAZOLAM 1 MG PO TABS: 1.0000 mg | ORAL_TABLET | Freq: Four times a day (QID) | ORAL | 0 refills | Status: DC | PRN

## 2020-07-10 MED ORDER — COLCHICINE 0.6 MG PO TABS
0.6000 mg | ORAL_TABLET | ORAL | Status: DC
  Administered 2020-07-10 – 2020-07-12 (×2): 0.6 mg via ORAL
  Filled 2020-07-10 (×2): qty 1

## 2020-07-10 NOTE — Progress Notes (Signed)
Today's progress note faxed to patient's outpatient HD clinic per request to provide update and continuity of care.  Alphonzo Cruise, [place] Renal Navigator [phone]

## 2020-07-10 NOTE — Progress Notes (Signed)
Physical Therapy Treatment Patient Details Name: Martha [name] MRN: [id number] DOB: [birth year] Today's Date: 07/10/2020    History of Present Illness Martha [name] is a 54 y.o. female with medical history significant for hypertension, ESRD on HD (MWF), T2 DM, degenerative disc disease, COPD, CHF, carpal tunnel syndrome, legally blind, bipolar, arthritis, anemia presents to the ED with complaint of left arm which has been ongoing since Monday she presents to [facility] due to same complaint on 12/6 last dialysis was on Wednesday 12/8 during which she only completed three fourths of her session due to left arm pain.  She also complained of back pain and leg pain.  Patient also complained of increased shortness of breath that has been ongoing for several days, she denies cough, fever, chest pain or any known sick contacts.    PT Comments    Patient demonstrates slightly labored movement for sitting up at bedside, good tolerance for completing BLE exercises while seated at bedside, limited to taking a few steps at bedside due to c/o fatigue and generalized weakness.  Patient SpO2 drops to 87% during exertion and tolerated sitting up in chair after therapy - nursing staff aware.  Patient will benefit from continued physical therapy in hospital and recommended venue below to increase strength, balance, endurance for safe ADLs and gait.    Follow Up Recommendations  SNF     Equipment Recommendations  None recommended by PT    Recommendations for Other Services       Precautions / Restrictions Precautions Precautions: Fall    Mobility  Bed Mobility Overal bed mobility: Needs Assistance Bed Mobility: Supine to Sit     Supine to sit: Min guard;Supervision     General bed mobility comments: increased time, labored movement  Transfers Overall transfer level: Needs assistance Equipment used: Rolling walker (2 wheeled) Transfers: Sit to/from [facility] Sit to Stand:  Min assist;Mod assist Stand pivot transfers: Min assist;Mod assist       General transfer comment: increased time, labored movement  Ambulation/Gait Ambulation/Gait assistance: Min assist;Mod assist Gait Distance (Feet): 6 Feet Assistive device: Rolling walker (2 wheeled) Gait Pattern/deviations: Decreased step length - right;Decreased step length - left;Decreased stride length Gait velocity: decreased   General Gait Details: limited to 6-7 slow labored steps at bedside due to fatigue   Stairs             Wheelchair Mobility    Modified Rankin (Stroke Patients Only)       Balance Overall balance assessment: Needs assistance Sitting-balance support: Feet supported;No upper extremity supported Sitting balance-Leahy Scale: Good Sitting balance - Comments: seated at EOB   Standing balance support: During functional activity;Bilateral upper extremity supported Standing balance-Leahy Scale: Fair Standing balance comment: using heavy duty RW                            Cognition Arousal/Alertness: Awake/alert Behavior During Therapy: WFL for tasks assessed/performed Overall Cognitive Status: Within Functional Limits for tasks assessed                                        Exercises General Exercises - Lower Extremity Long Arc Quad: Seated;AROM;Strengthening;Both;10 reps Hip Flexion/Marching: Seated;AROM;Strengthening;Both;15 reps Toe Raises: Seated;AROM;Strengthening;Both;15 reps Heel Raises: Seated;AROM;Strengthening;15 reps;Both    General Comments        Pertinent Vitals/Pain Pain Assessment: Faces  Faces Pain Scale: Hurts even more Pain Location: left hand/wrist Pain Descriptors / Indicators: Grimacing;Guarding Pain Intervention(s): Limited activity within patient's tolerance;Monitored during session    Home Living                      Prior Function            PT Goals (current goals can now be found in the  care plan section) Acute Rehab PT Goals Patient Stated Goal: return home with caregivers to assist PT Goal Formulation: With patient Time For Goal Achievement: 07/23/20 Potential to Achieve Goals: Good Progress towards PT goals: Progressing toward goals    Frequency    Min 3X/week      PT Plan      Co-evaluation              AM-PAC PT "6 Clicks" Mobility   Outcome Measure  Help needed turning from your back to your side while in a flat bed without using bedrails?: None Help needed moving from lying on your back to sitting on the side of a flat bed without using bedrails?: A Little Help needed moving to and from a bed to a chair (including a wheelchair)?: A Lot Help needed standing up from a chair using your arms (e.g., wheelchair or bedside chair)?: A Lot Help needed to walk in hospital room?: A Lot Help needed climbing 3-5 steps with a railing? : A Lot 6 Click Score: 15    End of Session Equipment Utilized During Treatment: Oxygen Activity Tolerance: Patient tolerated treatment well;Patient limited by fatigue Patient left: in chair;with call bell/phone within reach Nurse Communication: Mobility status PT Visit Diagnosis: Unsteadiness on feet (R26.81);Other abnormalities of gait and mobility (R26.89);Muscle weakness (generalized) (M62.81)     Time: 1430-1500 PT Time Calculation (min) (ACUTE ONLY): 30 min  Charges:  $Therapeutic Exercise: 8-22 mins $Therapeutic Activity: 8-22 mins                     3:29 PM, 07/10/20 Lonell Grandchild, MPT Physical Therapist with [facility] [phone] office [phone] mobile phone

## 2020-07-10 NOTE — Progress Notes (Signed)
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  Patient was having left arm pain for the last week, she had to shorten her hemodialysis sessions last 2 times.  Also complaining of shortness of breath.  In the emergency department she was febrile.  Patient was 87% on room air.  In the ER with leukocytosis, CT scan abdomen pelvis with new bibasilar airspace disease concerning for pneumonia and mild left-sided collecting system dilatation with asymmetrical fatty stranding about the left ureter with concern for ascending UTI.  Patient occasionally makes urine.  She was started on treatment with antibiotics for pneumonia and admitted to the hospital. Blood cultures with MRSA bacteremia.   Assessment & Plan:   Principal Problem:   CAP (community acquired pneumonia) Active Problems:   Morbid obesity ([place])   Essential hypertension   GERD   Sleep apnea   Type 2 diabetes mellitus treated with insulin (HCC)   Hyperglycemia due to diabetes mellitus ([place])   Bipolar 1 disorder (HCC)   ESRD (end stage renal disease) on dialysis ([place])   UTI (urinary tract infection)   Hypoalbuminemia   Acute respiratory failure with hypoxia (HCC)  MRSA bacteremia , community acquired pneumonia suspected MRSA pneumonia with acute hypoxemic respiratory failure:  Continue vancomycin, dosed by pharmacy with levels. Blood cultures 12/9 with MRSA Blood cultures 12/11 negative. 2D echocardiogram, no evidence of endocarditis. Painful left wrist and forearm, CT scan with contrast with no evidence of myositis or osteomyelitis. AV fistula with no evidence of infection. Followed by ID. Discussed with infectious  disease, patient will need 6 weeks of IV vancomycin with dialysis. Discussed with nephrology, they will arrange vancomycin infusion with hemodialysis.  Presumed UTI: Ruled out.  Hyperglycemia secondary to type 2 diabetes: On insulin that is continued.  ESRD on hemodialysis: On schedule hemodialysis.  Using left arm fistula.  Functioning well.  Left forearm pain and swelling: No evidence of ischemia, vascularity is intact.  No local erythema but diffuse swelling.  Denies trauma.  CT scan with contrast with no evidence of myositis, abscess or bony abnormalities.  May have gouty arthritis.  No evidence of abscess or synovial effusion. Colchicine 0.6 mg every 48 hours.  Does have history of gout.  Short prednisone trial.    Sleep apnea: Using CPAP.  Physical deconditioning/severe medical conditions: Inpatient therapies at rehab.  DVT prophylaxis: heparin injection 5,000 Units Start: 07/05/20 2330 SCDs Start: 07/05/20 2316   Code Status: Full code Family Communication: Patient's daughter on the phone 12/11. Disposition Plan: Status is: Inpatient  Remains inpatient appropriate because:Inpatient level of care appropriate due to severity of illness   Dispo: The patient is from: Home              Anticipated d/c is to: Skilled nursing facility.              Anticipated d/c date is: When bed is available.              Patient currently stable to discharge to skilled level of care. Patient can be transferred to [place] unit.  She is waiting to go to skilled nursing home.     Consultants:   Nephrology  ID  Procedures:  Hemodialysis  Antimicrobials:  Anti-infectives (From admission, onward)   Start     Dose/Rate Route Frequency Ordered Stop   07/10/20 0000  vancomycin IVPB        1,000 mg Intravenous Every M-W-F (Hemodialysis) 07/10/20 0807 08/18/20 2359   07/09/20 1200  vancomycin (VANCOCIN) IVPB 1000 mg/200 mL premix        1,000 mg 200 mL/hr over 60 Minutes Intravenous  Every M-W-F (Hemodialysis) 07/06/20 1129     07/06/20 2200  cefTRIAXone (ROCEPHIN) 1 g in sodium chloride 0.9 % 100 mL IVPB  Status:  Discontinued        1 g 200 mL/hr over 30 Minutes Intravenous Every 24 hours 07/06/20 0109 07/06/20 1041   07/06/20 2200  azithromycin (ZITHROMAX) 500 mg in sodium chloride 0.9 % 250 mL IVPB  Status:  Discontinued        500 mg 250 mL/hr over 60 Minutes Intravenous Every 24 hours 07/06/20 0109 07/07/20 0938   07/06/20 2000  vancomycin (VANCOCIN) IVPB 1000 mg/200 mL premix        1,000 mg 200 mL/hr over 60 Minutes Intravenous  Once 07/06/20 1413 07/06/20 2025   07/06/20 1800  vancomycin (VANCOREADY) IVPB 2000 mg/400 mL  Status:  Discontinued        2,000 mg 200 mL/hr over 120 Minutes Intravenous  Once 07/06/20 1212 07/06/20 1340   07/06/20 1330  vancomycin (VANCOREADY) IVPB 2000 mg/400 mL        2,000 mg 200 mL/hr over 120 Minutes Intravenous  Once 07/06/20 1340 07/06/20 1437   07/06/20 1100  vancomycin (VANCOREADY) IVPB 2000 mg/400 mL  Status:  Discontinued        2,000 mg 200 mL/hr over 120 Minutes Intravenous  Once 07/06/20 1035 07/06/20 1212   07/05/20 2130  cefTRIAXone (ROCEPHIN) 1 g in sodium chloride 0.9 % 100 mL IVPB        1 g 200 mL/hr over 30 Minutes Intravenous  Once 07/05/20 2120 07/06/20 0005   07/05/20 2130  azithromycin (ZITHROMAX) tablet 500 mg        500 mg Oral  Once 07/05/20 2120 07/05/20 2336         Subjective: Patient seen and examined.  No overnight events.  Does not wear CPAP so oxygen saturations go down.  Still has some pain on her left wrist, however better than before.  Objective: Vitals:   07/10/20 0626 07/10/20 0800 07/10/20 0818 07/10/20 1116  BP: (!) 166/74 (!) 167/80    Pulse: 62 63 70 67  Resp: (!) 8 11 12 15   Temp:   (!) 97.4 F (36.3 C) 97.7 F (36.5 C)  TempSrc:   Oral Oral  SpO2: 98% 97% (!) 86% 96%  Weight:      Height:        Intake/Output Summary (Last 24 hours) at 07/10/2020 1349 Last data filed  at 07/10/2020 1000 Gross per 24 hour  Intake 440 ml  Output 1803 ml  Net -1363 ml   Filed Weights   07/08/20 0444 07/09/20 1140 07/09/20 1530  Weight: 131.5 kg (!) 141.5 kg (!) 139.8 kg    Examination:  General exam: Chronically sick looking.  Comfortable at rest. She is legally blind.  Does not see waving of hands. Respiratory system: Clear to auscultation. Respiratory effort normal.  Mostly conducted airway sounds.  Difficult to auscultate. Cardiovascular system: S1 & S2 heard, RRR.  Gastrointestinal system: Obese and pendulous.  Nontender.  Bowel sounds present. Central nervous system: Alert and  oriented. No focal neurological deficits. Extremities: Symmetric 5 x 5 power. Left upper extremity AV fistula present with thrill.  Nontender at the fistula. Tenderness with no swelling erythema, mild swelling on the dorsum of the forearm.   Data Reviewed: I have personally reviewed following labs and imaging studies  CBC: Recent Labs  Lab 07/05/20 1813 07/06/20 0500 07/07/20 0334 07/08/20 0356 07/09/20 0451 07/10/20 0413  WBC 11.9* 11.2* 10.8* 10.5 11.9* 10.5  NEUTROABS 9.7*  --  8.7* 8.5* 9.9* 9.2*  HGB 11.1* 10.7* 9.7* 9.8* 9.6* 10.3*  HCT 36.4 35.2* 31.5* 32.8* 31.4* 33.9*  MCV 96.8 95.9 96.3 98.2 96.3 95.8  PLT 225 234 202 199 213 161   Basic Metabolic Panel: Recent Labs  Lab 07/05/20 1813 07/06/20 0500 07/07/20 0334 07/08/20 0356 07/09/20 0451  NA 132* 134* 132* 132* 132*  K 4.6 4.6 3.8 4.3 4.9  CL 91* 92* 91* 91* 89*  CO2 22 23 28 26 25   GLUCOSE 158* 130* 167* 119* 121*  BUN 57* 68* 35* 55* 72*  CREATININE 12.35* 13.55* 7.46* 10.15* 11.80*  CALCIUM 8.3* 8.8* 8.5* 8.5* 8.6*  MG  --  1.9 1.8  --   --   PHOS  --  6.5*  --   --   --    GFR: Estimated Creatinine Clearance: 8.2 mL/min (A) (by C-G formula based on SCr of 11.8 mg/dL (H)). Liver Function Tests: Recent Labs  Lab 07/05/20 1813 07/06/20 0500  AST 29 16  ALT 38 26  ALKPHOS 146* 130*  BILITOT  1.6* 0.8  PROT 8.1 7.7  ALBUMIN 3.3* 3.0*   Recent Labs  Lab 07/05/20 1813  LIPASE 23   No results for input(s): AMMONIA in the last 168 hours. Coagulation Profile: Recent Labs  Lab 07/05/20 1813 07/06/20 0500  INR 1.3* 1.3*   Cardiac Enzymes: No results for input(s): CKTOTAL, CKMB, CKMBINDEX, TROPONINI in the last 168 hours. BNP (last 3 results) No results for input(s): PROBNP in the last 8760 hours. HbA1C: No results for input(s): HGBA1C in the last 72 hours. CBG: Recent Labs  Lab 07/09/20 1126 07/09/20 1609 07/09/20 2221 07/10/20 0820 07/10/20 1115  GLUCAP 122* 154* 169* 148* 136*   Lipid Profile: No results for input(s): CHOL, HDL, LDLCALC, TRIG, CHOLHDL, LDLDIRECT in the last 72 hours. Thyroid Function Tests: No results for input(s): TSH, T4TOTAL, FREET4, T3FREE, THYROIDAB in the last 72 hours. Anemia Panel: Recent Labs    07/09/20 1302  FERRITIN 2,821*  TIBC 159*  IRON 36   Sepsis Labs: Recent Labs  Lab 07/05/20 1813 07/05/20 2009  LATICACIDVEN 1.6 0.9    Recent Results (from the past 240 hour(s))  Resp Panel by RT-PCR (Flu A&B, Covid) Nasopharyngeal Swab     Status: None   Collection Time: 07/02/20 10:05 AM   Specimen: Nasopharyngeal Swab; Nasopharyngeal(NP) swabs in vial transport medium  Result Value Ref Range Status   SARS Coronavirus 2 by RT PCR NEGATIVE NEGATIVE Final    Comment: (NOTE) SARS-CoV-2 target nucleic acids are NOT DETECTED.  The SARS-CoV-2 RNA is generally detectable in upper respiratory specimens during the acute phase of infection. The lowest concentration of SARS-CoV-2 viral copies this assay can detect is 138 copies/mL. A negative result does not preclude SARS-Cov-2 infection and should not be used as the sole basis for treatment or other patient management decisions. A negative result may occur with  improper specimen collection/handling, submission of specimen other than nasopharyngeal swab, presence of viral  mutation(s) within the areas  targeted by this assay, and inadequate number of viral copies(<138 copies/mL). A negative result must be combined with clinical observations, patient history, and epidemiological information. The expected result is Negative.  Fact Sheet for Patients:  EntrepreneurPulse.com.au  Fact Sheet for Healthcare Providers:  IncredibleEmployment.be  This test is no t yet approved or cleared by the [place] FDA and  has been authorized for detection and/or diagnosis of SARS-CoV-2 by FDA under an Emergency Use Authorization (EUA). This EUA will remain  in effect (meaning this test can be used) for the duration of the COVID-19 declaration under Section 564(b)(1) of the Act, 21 U.S.C.section 360bbb-3(b)(1), unless the authorization is terminated  or revoked sooner.       Influenza A by PCR NEGATIVE NEGATIVE Final   Influenza B by PCR NEGATIVE NEGATIVE Final    Comment: (NOTE) The Xpert Xpress SARS-CoV-2/FLU/RSV plus assay is intended as an aid in the diagnosis of influenza from Nasopharyngeal swab specimens and should not be used as a sole basis for treatment. Nasal washings and aspirates are unacceptable for Xpert Xpress SARS-CoV-2/FLU/RSV testing.  Fact Sheet for Patients: EntrepreneurPulse.com.au  Fact Sheet for Healthcare Providers: IncredibleEmployment.be  This test is not yet approved or cleared by the [place] FDA and has been authorized for detection and/or diagnosis of SARS-CoV-2 by FDA under an Emergency Use Authorization (EUA). This EUA will remain in effect (meaning this test can be used) for the duration of the COVID-19 declaration under Section 564(b)(1) of the Act, 21 U.S.C. section 360bbb-3(b)(1), unless the authorization is terminated or revoked.  Performed at [facility], [address] [place], [place] [address]   Culture, blood (single)     Status: Abnormal    Collection Time: 07/05/20  6:11 PM   Specimen: BLOOD  Result Value Ref Range Status   Specimen Description   Final    BLOOD BLOOD RIGHT WRIST Performed at [facility], [address], [place], [place] [address]    Special Requests   Final    BOTTLES DRAWN AEROBIC AND ANAEROBIC Blood Culture results may not be optimal due to an inadequate volume of blood received in culture bottles Performed at [facility], [address], [place], [place] [address]    Culture  Setup Time   Final    GRAM POSITIVE COCCI ANAEROBIC AND AEROBIC BOTTLES  Gram Stain Report Called to,Read Back By and Verified With: LOOMIS,K@1150  BY MATTHEWS,B12.10.21 CRITICAL VALUE NOTED.  VALUE IS CONSISTENT WITH PREVIOUSLY REPORTED AND CALLED VALUE.    Culture (A)  Final    STAPHYLOCOCCUS AUREUS SUSCEPTIBILITIES PERFORMED ON PREVIOUS CULTURE WITHIN THE LAST 5 DAYS. Performed at [facility], [place] [address] [place], [place] [address]    Report Status 07/08/2020 FINAL  Final  Blood culture (routine single)     Status: Abnormal   Collection Time: 07/05/20  6:13 PM   Specimen: BLOOD RIGHT WRIST  Result Value Ref Range Status   Specimen Description   Final    BLOOD RIGHT WRIST Performed at [facility], [address], [place], [place] [address]    Special Requests   Final    BOTTLES DRAWN AEROBIC AND ANAEROBIC Blood Culture results may not be optimal due to an inadequate volume of blood received in culture bottles Performed at [facility], [address], [place], [place] [address]    Culture  Setup Time   Final    GRAM POSITIVE COCCI BOTH ANAEROBIC AND AEROBIC BOTTLES Gram Stain Report Called to,Read Back By and Verified  With: PARNELL,R @0945  BY MATTHEWS B 12.10.21 CRITICAL RESULT CALLED TO, READ BACK BY AND VERIFIED WITH: PHARMD L POOLE [phone] FCP Performed at [facility], [place] [address] [place], [place] [address]    Culture METHICILLIN RESISTANT STAPHYLOCOCCUS AUREUS (A)  Final   Report  Status 07/08/2020 FINAL  Final   Organism ID, Bacteria METHICILLIN RESISTANT STAPHYLOCOCCUS AUREUS  Final      Susceptibility   Methicillin resistant staphylococcus aureus - MIC*    CIPROFLOXACIN >=8 RESISTANT Resistant     ERYTHROMYCIN >=8 RESISTANT Resistant     GENTAMICIN <=0.5 SENSITIVE Sensitive     OXACILLIN >=4 RESISTANT Resistant     TETRACYCLINE <=1 SENSITIVE Sensitive     VANCOMYCIN <=0.5 SENSITIVE Sensitive     TRIMETH/SULFA <=10 SENSITIVE Sensitive     CLINDAMYCIN <=0.25 SENSITIVE Sensitive     RIFAMPIN <=0.5 SENSITIVE Sensitive     Inducible Clindamycin NEGATIVE Sensitive     * METHICILLIN RESISTANT STAPHYLOCOCCUS AUREUS  Blood Culture ID Panel (Reflexed)     Status: Abnormal   Collection Time: 07/05/20  6:13 PM  Result Value Ref Range Status   Enterococcus faecalis NOT DETECTED NOT DETECTED Final   Enterococcus Faecium NOT DETECTED NOT DETECTED Final   Listeria monocytogenes NOT DETECTED NOT DETECTED Final   Staphylococcus species DETECTED (A) NOT DETECTED Final    Comment: CRITICAL RESULT CALLED TO, READ BACK BY AND VERIFIED WITH: PHARMD L POOLE [phone] FCP    Staphylococcus aureus (BCID) DETECTED (A) NOT DETECTED Final    Comment: Methicillin (oxacillin)-resistant Staphylococcus aureus (MRSA). MRSA is predictably resistant to beta-lactam antibiotics (except ceftaroline). Preferred therapy is vancomycin unless clinically contraindicated. Patient requires contact precautions if  hospitalized. CRITICAL RESULT CALLED TO, READ BACK BY AND VERIFIED WITH: PHARMD L POOLE [phone] FCP    Staphylococcus epidermidis NOT DETECTED NOT DETECTED Final   Staphylococcus lugdunensis NOT DETECTED NOT DETECTED Final   Streptococcus species NOT DETECTED NOT DETECTED Final   Streptococcus agalactiae NOT DETECTED NOT DETECTED Final   Streptococcus pneumoniae NOT DETECTED NOT DETECTED Final   Streptococcus pyogenes NOT DETECTED NOT DETECTED Final   A.calcoaceticus-baumannii NOT  DETECTED NOT DETECTED Final   Bacteroides fragilis NOT DETECTED NOT DETECTED Final   Enterobacterales NOT DETECTED NOT DETECTED Final   Enterobacter cloacae complex NOT DETECTED NOT DETECTED Final   Escherichia coli NOT DETECTED NOT DETECTED Final   Klebsiella aerogenes NOT DETECTED NOT DETECTED Final   Klebsiella oxytoca NOT DETECTED NOT DETECTED Final   Klebsiella pneumoniae NOT DETECTED NOT DETECTED Final   Proteus species NOT DETECTED NOT DETECTED Final   Salmonella species NOT DETECTED NOT DETECTED Final   Serratia marcescens NOT DETECTED NOT DETECTED Final   Haemophilus influenzae NOT DETECTED NOT DETECTED Final   Neisseria meningitidis NOT DETECTED NOT DETECTED Final   Pseudomonas aeruginosa NOT DETECTED NOT DETECTED Final   Stenotrophomonas maltophilia NOT DETECTED NOT DETECTED Final   Candida albicans NOT DETECTED NOT DETECTED Final   Candida auris NOT DETECTED NOT DETECTED Final   Candida glabrata NOT DETECTED NOT DETECTED Final   Candida krusei NOT DETECTED NOT DETECTED Final   Candida parapsilosis NOT DETECTED NOT DETECTED Final   Candida tropicalis NOT DETECTED NOT DETECTED Final   Cryptococcus neoformans/gattii NOT DETECTED NOT DETECTED Final   Meth resistant mecA/C and MREJ DETECTED (A) NOT DETECTED Final    Comment: CRITICAL RESULT CALLED TO, READ BACK BY AND VERIFIED WITH: PHARMD L POOLE [phone] [id number] FCP Performed at [facility], [address], [place], [place] [address]   Resp Panel by RT-PCR (Flu A&B, Covid) Nasopharyngeal Swab     Status: None   Collection Time: 07/05/20  9:49 PM   Specimen: Nasopharyngeal Swab; Nasopharyngeal(NP) swabs in vial transport medium  Result Value Ref Range Status   SARS Coronavirus 2 by RT PCR NEGATIVE NEGATIVE Final    Comment: (NOTE) SARS-CoV-2 target nucleic acids are NOT DETECTED.  The SARS-CoV-2 RNA is generally detectable in upper respiratory specimens during the acute phase of infection. The lowest concentration of  SARS-CoV-2 viral copies this assay can detect is 138 copies/mL. A negative result does not preclude SARS-Cov-2 infection and should not be used as the sole basis for treatment or other patient management decisions. A negative result may occur with  improper specimen collection/handling, submission of specimen other than nasopharyngeal swab, presence of viral mutation(s) within the areas targeted by this assay, and inadequate number of viral copies(<138 copies/mL). A negative result must be combined with clinical observations, patient history, and epidemiological information. The expected result is Negative.  Fact Sheet for Patients:  EntrepreneurPulse.com.au  Fact Sheet for Healthcare Providers:  IncredibleEmployment.be  This test is no t yet approved or cleared by the [place] FDA and  has been authorized for detection and/or diagnosis of SARS-CoV-2 by FDA under an Emergency Use Authorization (EUA). This EUA will remain  in effect (meaning this test can be used) for the duration of the COVID-19 declaration under Section 564(b)(1) of the Act, 21 U.S.C.section 360bbb-3(b)(1), unless the authorization is terminated  or revoked sooner.       Influenza A by PCR NEGATIVE NEGATIVE Final   Influenza B by PCR NEGATIVE NEGATIVE Final    Comment: (NOTE) The Xpert Xpress SARS-CoV-2/FLU/RSV plus assay is intended as an aid in the diagnosis of influenza from Nasopharyngeal swab specimens and should not be used as a sole basis for treatment. Nasal washings and aspirates are unacceptable for Xpert Xpress SARS-CoV-2/FLU/RSV testing.  Fact Sheet for Patients: EntrepreneurPulse.com.au  Fact Sheet for Healthcare Providers: IncredibleEmployment.be  This test is not yet approved or cleared by the [place] FDA and has been authorized for detection and/or diagnosis of SARS-CoV-2 by FDA under an Emergency Use  Authorization (EUA). This EUA will remain in effect (meaning this test can be used) for the duration of the COVID-19 declaration under Section 564(b)(1) of the Act, 21 U.S.C. section 360bbb-3(b)(1), unless the authorization is terminated or revoked.  Performed at [facility], [address] [place], [place] [address]   MRSA PCR Screening     Status: Abnormal   Collection Time: 07/06/20 11:31 AM   Specimen: Nasal Mucosa; Nasopharyngeal  Result Value Ref Range Status   MRSA by PCR POSITIVE (A) NEGATIVE Final    Comment:        The GeneXpert MRSA Assay (FDA approved for NASAL specimens only), is one component of a comprehensive MRSA colonization surveillance program. It is not intended to diagnose MRSA infection nor to guide or monitor treatment for MRSA infections. RESULT CALLED TO, READ BACK BY AND VERIFIED WITH: LOOMIS K. @ 6387 ON [id number] BY HENDERSON L. Performed at [facility], [address], [place], [place] [address]   Culture, blood (routine x 2)     Status: None (Preliminary result)   Collection Time: 07/07/20  8:59 AM   Specimen: BLOOD RIGHT HAND  Result Value Ref Range Status   Specimen Description   Final    BLOOD RIGHT HAND BOTTLES DRAWN AEROBIC  AND ANAEROBIC   Special Requests Blood Culture adequate volume  Final   Culture   Final    NO GROWTH 2 DAYS Performed at [facility], [address], [place], [place] [address]    Report Status PENDING  Incomplete         Radiology Studies: No results found.      Scheduled Meds: . atorvastatin  20 mg Oral Daily  . Chlorhexidine Gluconate Cloth  6 each Topical Q0600  . colchicine  0.6 mg Oral QODAY  . [START ON 07/11/2020] darbepoetin (ARANESP) injection - DIALYSIS  25 mcg Intravenous Q Wed-HD  . dextromethorphan-guaiFENesin  1 tablet Oral BID  . feeding supplement (GLUCERNA SHAKE)  237 mL Oral TID BM  . gabapentin  100 mg Oral QHS  . heparin  5,000 Units Subcutaneous Q8H  . insulin aspart  0-5 Units  Subcutaneous QHS  . insulin aspart  0-6 Units Subcutaneous TID WC  . mouth rinse  15 mL Mouth Rinse BID  . mupirocin ointment  1 application Nasal BID  . pantoprazole  40 mg Oral Daily  . PARoxetine  20 mg Oral QPM  . [START ON 07/11/2020] predniSONE  40 mg Oral Q breakfast  . sevelamer carbonate  800 mg Oral TID WC   Continuous Infusions: . sodium chloride    . sodium chloride    . vancomycin 1,000 mg (07/09/20 1415)     LOS: 5 days    Time spent: 30 minutes    Barb Merino, MD [facility] Pager [phone]

## 2020-07-10 NOTE — Progress Notes (Signed)
Patient ID: Martha [name], female   DOB: [birth year], 53 y.o.   MRN: [id number] S: Feels better but still having left wrist and arm pain. O:BP (!) 167/80   Pulse 70   Temp (!) 97.4 F (36.3 C) (Oral)   Resp 12   Ht 5\' 7"  (1.702 m)   Wt (!) 139.8 kg   LMP 08/03/2008 (Exact Date)   SpO2 (!) 86%   BMI 48.27 kg/m   Intake/Output Summary (Last 24 hours) at 07/10/2020 0927 Last data filed at 07/10/2020 0338 Gross per 24 hour  Intake 200 ml  Output 1803 ml  Net -1603 ml   Intake/Output: I/O last 3 completed shifts: In: 200 [IV [id number]:893] Out: 1803 [YBOFB:5102]  Intake/Output this shift:  No intake/output data recorded. Weight change:  Gen: NAD CVS: RRR Resp: CTA Abd: +BS, soft, NT/ND Ext: no edema, LUE AVF +T/B, + left wrist pain with movement and tender to touch.  Recent Labs  Lab 07/05/20 1813 07/06/20 0500 07/07/20 0334 07/08/20 0356 07/09/20 0451  NA 132* 134* 132* 132* 132*  K 4.6 4.6 3.8 4.3 4.9  CL 91* 92* 91* 91* 89*  CO2 22 23 28 26 25   GLUCOSE 158* 130* 167* 119* 121*  BUN 57* 68* 35* 55* 72*  CREATININE 12.35* 13.55* 7.46* 10.15* 11.80*  ALBUMIN 3.3* 3.0*  --   --   --   CALCIUM 8.3* 8.8* 8.5* 8.5* 8.6*  PHOS  --  6.5*  --   --   --   AST 29 16  --   --   --   ALT 38 26  --   --   --    Liver Function Tests: Recent Labs  Lab 07/05/20 1813 07/06/20 0500  AST 29 16  ALT 38 26  ALKPHOS 146* 130*  BILITOT 1.6* 0.8  PROT 8.1 7.7  ALBUMIN 3.3* 3.0*   Recent Labs  Lab 07/05/20 1813  LIPASE 23   No results for input(s): AMMONIA in the last 168 hours. CBC: Recent Labs  Lab 07/06/20 0500 07/07/20 0334 07/08/20 0356 07/09/20 0451 07/10/20 0413  WBC 11.2* 10.8* 10.5 11.9* 10.5  NEUTROABS  --  8.7* 8.5* 9.9* 9.2*  HGB 10.7* 9.7* 9.8* 9.6* 10.3*  HCT 35.2* 31.5* 32.8* 31.4* 33.9*  MCV 95.9 96.3 98.2 96.3 95.8  PLT 234 202 199 213 206   Cardiac Enzymes: No results for input(s): CKTOTAL, CKMB, CKMBINDEX, TROPONINI in the last 168  hours. CBG: Recent Labs  Lab 07/09/20 0729 07/09/20 1126 07/09/20 1609 07/09/20 2221 07/10/20 0820  GLUCAP 114* 122* 154* 169* 148*    Iron Studies:  Recent Labs    07/09/20 1302  IRON 36  TIBC 159*  FERRITIN 2,821*   Studies/Results: No results found. Marland Kitchen atorvastatin  20 mg Oral Daily  . Chlorhexidine Gluconate Cloth  6 each Topical Q0600  . [START ON 07/11/2020] darbepoetin (ARANESP) injection - DIALYSIS  25 mcg Intravenous Q Wed-HD  . dextromethorphan-guaiFENesin  1 tablet Oral BID  . feeding supplement (GLUCERNA SHAKE)  237 mL Oral TID BM  . gabapentin  100 mg Oral QHS  . heparin  5,000 Units Subcutaneous Q8H  . insulin aspart  0-5 Units Subcutaneous QHS  . insulin aspart  0-6 Units Subcutaneous TID WC  . mouth rinse  15 mL Mouth Rinse BID  . mupirocin ointment  1 application Nasal BID  . pantoprazole  40 mg Oral Daily  . PARoxetine  20 mg Oral QPM  . predniSONE  40 mg Oral Q breakfast  . sevelamer carbonate  800 mg Oral TID WC    BMET    Component Value Date/Time   NA 132 (L) 07/09/2020 0451   K 4.9 07/09/2020 0451   CL 89 (L) 07/09/2020 0451   CO2 25 07/09/2020 0451   GLUCOSE 121 (H) 07/09/2020 0451   BUN 72 (H) 07/09/2020 0451   CREATININE 11.80 (H) 07/09/2020 0451   CALCIUM 8.6 (L) 07/09/2020 0451   CALCIUM 9.4 04/10/2017 0425   GFRNONAA 4 (L) 07/09/2020 0451   GFRAA 8 (L) 03/06/2020 0215   CBC    Component Value Date/Time   WBC 10.5 07/10/2020 0413   RBC 3.54 (L) 07/10/2020 0413   HGB 10.3 (L) 07/10/2020 0413   HGB 11.5 03/16/2013 0000   HCT 33.9 (L) 07/10/2020 0413   HCT 29.3 (L) 04/06/2017 0357   HCT 36 03/16/2013 0000   PLT 206 07/10/2020 0413   MCV 95.8 07/10/2020 0413   MCV 87.8 03/16/2013 0000   MCH 29.1 07/10/2020 0413   MCHC 30.4 07/10/2020 0413   RDW 16.5 (H) 07/10/2020 0413   LYMPHSABS 0.8 07/10/2020 0413   MONOABS 0.3 07/10/2020 0413   EOSABS 0.0 07/10/2020 0413   BASOSABS 0.0 07/10/2020 0413      Assessment/Plan:  1. MRSA bacteremia presumably due to pneumonia- on vancomycin. 2. ESRD- continue with HD on MWF schedule 3. Pain of left hand/wrist- appears more musculoskeletal and no evidence of steal syndrome on exam.  She was able to tolerate HD using the fistula yesterday without issue.  Agree with short taper of prednisone for possible gout.  CT scan negative for osteo or septic joint. 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 5. HTN/volume- UF as tolerated. 6. SHPTH- continue with binders and follow. 7. DM type 2- per primary 8. OSA on CPAP  Donetta Potts, MD [employer] [phone]

## 2020-07-11 LAB — CBC
HCT: 32.2 % — ABNORMAL LOW (ref 36.0–46.0)
Hemoglobin: 9.9 g/dL — ABNORMAL LOW (ref 12.0–15.0)
MCH: 29.3 pg (ref 26.0–34.0)
MCHC: 30.7 g/dL (ref 30.0–36.0)
MCV: 95.3 fL (ref 80.0–100.0)
Platelets: 245 10*3/uL (ref 150–400)
RBC: 3.38 MIL/uL — ABNORMAL LOW (ref 3.87–5.11)
RDW: 16.3 % — ABNORMAL HIGH (ref 11.5–15.5)
WBC: 8.7 10*3/uL (ref 4.0–10.5)
nRBC: 0 % (ref 0.0–0.2)

## 2020-07-11 LAB — GLUCOSE, CAPILLARY
Glucose-Capillary: 197 mg/dL — ABNORMAL HIGH (ref 70–99)
Glucose-Capillary: 183 mg/dL — ABNORMAL HIGH (ref 70–99)
Glucose-Capillary: 215 mg/dL — ABNORMAL HIGH (ref 70–99)
Glucose-Capillary: 175 mg/dL — ABNORMAL HIGH (ref 70–99)

## 2020-07-11 MED ORDER — FAMOTIDINE 20 MG PO TABS
20.0000 mg | ORAL_TABLET | Freq: Every day | ORAL | Status: DC
  Administered 2020-07-11 – 2020-07-12 (×2): 20 mg via ORAL
  Filled 2020-07-11 (×2): qty 1

## 2020-07-11 MED ORDER — CALCIUM CARBONATE ANTACID 500 MG PO CHEW: 4.0000 | CHEWABLE_TABLET | Freq: Three times a day (TID) | ORAL | Status: DC

## 2020-07-11 MED ORDER — CALCIUM CARBONATE ANTACID 500 MG PO CHEW
400.0000 mg | CHEWABLE_TABLET | Freq: Three times a day (TID) | ORAL | Status: DC
  Administered 2020-07-11 – 2020-07-12 (×2): 400 mg via ORAL
  Filled 2020-07-11: qty 2

## 2020-07-11 MED ORDER — CALCIUM CARBONATE ANTACID 500 MG PO CHEW
2000.0000 mg | CHEWABLE_TABLET | Freq: Three times a day (TID) | ORAL | Status: DC
  Filled 2020-07-11 (×2): qty 10

## 2020-07-11 MED ORDER — DORZOLAMIDE HCL-TIMOLOL MAL 2-0.5 % OP SOLN
1.0000 [drp] | Freq: Two times a day (BID) | OPHTHALMIC | Status: DC
  Administered 2020-07-11 – 2020-07-12 (×3): 1 [drp] via OPHTHALMIC
  Filled 2020-07-11: qty 10

## 2020-07-11 MED ORDER — DOXERCALCIFEROL 4 MCG/2ML IV SOLN: 1.0000 ug | INTRAVENOUS | Status: DC

## 2020-07-11 MED ORDER — CINACALCET HCL 30 MG PO TABS
30.0000 mg | ORAL_TABLET | Freq: Every day | ORAL | Status: DC
  Administered 2020-07-11 – 2020-07-12 (×2): 30 mg via ORAL
  Filled 2020-07-11 (×2): qty 1

## 2020-07-11 NOTE — TOC Progression Note (Signed)
Transition of Care [facility]) - Progression Note   Patient Details  Name: Martha [name] MRN: [id number] Date of Birth: July 24, 1966  Transition of Care [facility]) CM/SW [place], LCSW Phone Number: 07/11/2020, 1:12 PM  Clinical Narrative: PASRR received: [id number] E. TOC awaiting bed offers; patient has been declined from 13 facilities at this time.  CSW called Debbie with [facility] is willing to make a bed offer provided patient will sign over her check and stay for the full 30 days. Debbie reviewing patient's check situation with patient. TOC to follow.  Expected Discharge Plan: [place] Barriers to Discharge: Continued Medical Work up  Expected Discharge Plan and Services Expected Discharge Plan: [place] In-house Referral: Clinical Social Work Discharge Planning Services: NA Post Acute Care Choice: [place] Living arrangements for the past 2 months: Apartment             DME Arranged: N/A DME Agency: NA HH Arranged: NA [place] Agency: NA  Readmission Risk Interventions Readmission Risk Prevention Plan 07/09/2020  Transportation Screening Complete  Medication Review Press photographer) Complete  PCP or Specialist appointment within 3-5 days of discharge Not Complete  PCP/Specialist Appt Not Complete comments Discharging to SNF  [place] or Home Care Consult Complete  SW Recovery Care/Counseling Consult Complete  Palliative Care Screening Not Applicable  Skilled Nursing Facility Complete  Some recent data might be hidden

## 2020-07-11 NOTE — Evaluation (Signed)
Occupational Therapy Evaluation Patient Details Name: Martha [name] MRN: [id number] DOB: 01/18/1966 Today's Date: 07/11/2020    History of Present Illness Martha [name] is a 54 y.o. female with medical history significant for hypertension, ESRD on HD (MWF), T2 DM, degenerative disc disease, COPD, CHF, carpal tunnel syndrome, legally blind, bipolar, arthritis, anemia presents to the ED with complaint of left arm which has been ongoing since Monday she presents to [facility] due to same complaint on 12/6 last dialysis was on Wednesday 12/8 during which she only completed three fourths of her session due to left arm pain.  She also complained of back pain and leg pain.  Patient also complained of increased shortness of breath that has been ongoing for several days, she denies cough, fever, chest pain or any known sick contacts.   Clinical Impression   Patient in bed upon therapy arrival and agreeable to participate in OT evaluation. Concern voiced about left UE pain mentioning that it is located in her hand and it extends almost to her elbow. She is tender to the touch with limited wrist and hand ROM. She reports that it has not decreased since she has arrived. Discussed pain management techniques such as positioning of the UE. Pt requested a stress ball to squeeze. Therapist was able to adaptive a rolled up washcloth for patient to use as a stress ball was not available. At baseline, patient lives alone and receives assistance from home health aids for ADL completion. She reports she is normally able to feed and bathe herself without assist. Due to left UE pain, patient is experiencing increased difficulty participating in ADL tasks and would benefit from continued OT services to address. Recommendation is discharge to SNF prior to returning home due to lower extremity weakness when PT evaluated her. Pt is concerned about losing her apartment if she needs to use her entire paycheck for the SNF. If  patient does not discharge to SNF, she will benefit from [facility] OT to address her LUE pain. OT will follow her acutely.     Follow Up Recommendations  SNF    Equipment Recommendations  None recommended by OT       Precautions / Restrictions Precautions Precautions: Fall;Other (comment) Precaution Comments: legally blind. Requires assistance for meals.      Mobility Bed Mobility     General bed mobility comments: Not assessed    Transfers     General transfer comment: Not assessed        ADL either performed or assessed with clinical judgement   ADL Overall ADL's : Needs assistance/impaired Eating/Feeding: Total assistance Eating/Feeding Details (indicate cue type and reason): Due to vision patient is requiring increased assistance with meals. Pt reports that when she uses her eye drops (which are not with her) she is able to see a little in her right eye and is able to feed herself at home.     Upper Body Bathing: Total assistance;Bed level   Lower Body Bathing: Total assistance;Bed level       General ADL Comments: patient receives assistance at baseline for ADL tasks with the exception of self feeding and bathing. patient reports that she is unable to see a small amount like normal due to her eye drops are at home.     Vision Baseline Vision/History: Legally blind Patient Visual Report: Other (comment) (Pt normally is able to see a very small amount in her right eye although she does not have her eye drops with her  and she lacks any vision in the right eye due to this.)              Pertinent Vitals/Pain Pain Assessment: 0-10 Pain Score: 8  Pain Location: left hand/wrist/forearm Pain Descriptors / Indicators: Grimacing;Guarding;Sore;Sharp;Constant Pain Intervention(s): Limited activity within patient's tolerance;Monitored during session;Repositioned     Hand Dominance Right   Extremity/Trunk Assessment Upper Extremity Assessment Upper Extremity  Assessment: LUE deficits/detail LUE Deficits / Details: Shoulder and elbow A/ROM is WNL with 5/5 strength. Functional active wrist flexion. Unable to extend wrist past nuetral actively. Slight wrist extension achieved passively although limited by pain. Unable to form a full fist (approximately 50% achieved). LUE Sensation: WNL LUE Coordination: decreased fine motor;decreased gross motor   Lower Extremity Assessment Lower Extremity Assessment: Defer to PT evaluation       Communication Communication Communication: No difficulties   Cognition Arousal/Alertness: Awake/alert Behavior During Therapy: WFL for tasks assessed/performed Overall Cognitive Status: Within Functional Limits for tasks assessed        Exercises Other Exercises Other Exercises: Discussed elevating her LUE on two pillows when in bed or in recliner. Rolled a washcloth and taped it to make a small "stress ball" to gently squeeze for her grip.        Home Living Family/patient expects to be discharged to:: Skilled nursing facility Living Arrangements: Alone Available Help at Discharge: Personal care attendant Type of Home: Apartment Home Access: Ramped entrance     Home Layout: One level     Bathroom Shower/Tub: Teacher, early years/pre: Standard Bathroom Accessibility: Yes   Home Equipment: Shower seat;Grab bars - tub/shower;Wheelchair - manual;Toilet riser   Additional Comments: pt receives diaylsis Mon-Wed-Fri      Prior Functioning/Environment Level of Independence: Needs assistance  Gait / Transfers Assistance Needed: Household and short distanced Electronics engineer ADL's / Homemaking Assistance Needed: home aide M-F 6 hours/day x 5 days/week, 3 to 3 1/2 hours/day on weekends            OT Problem List: Decreased strength;Pain;Decreased coordination;Decreased range of motion;Increased edema;Decreased activity tolerance;Impaired UE functional use      OT  Treatment/Interventions: Therapeutic exercise;Therapeutic activities;Self-care/ADL training;Manual therapy;Modalities;Patient/family education    OT Goals(Current goals can be found in the care plan section) Acute Rehab OT Goals Patient Stated Goal: for a stress ball for her left hand OT Goal Formulation: With patient Time For Goal Achievement: 07/25/20 Potential to Achieve Goals: Good  OT Frequency: Min 2X/week    AM-PAC OT "6 Clicks" Daily Activity     Outcome Measure Help from another person eating meals?: Total Help from another person taking care of personal grooming?: Total Help from another person toileting, which includes using toliet, bedpan, or urinal?: Total Help from another person bathing (including washing, rinsing, drying)?: Total Help from another person to put on and taking off regular upper body clothing?: Total Help from another person to put on and taking off regular lower body clothing?: Total 6 Click Score: 6   End of Session Nurse Communication: Other (comment) (vision deficits and requires assistance for meals)  Activity Tolerance: Patient tolerated treatment well;Patient limited by pain (Limited by wrist pain) Patient left: in bed;with call bell/phone within reach;with nursing/sitter in room  OT Visit Diagnosis: Muscle weakness (generalized) (M62.81);Pain Pain - Right/Left: Left Pain - part of body: Hand;Arm                Time: 1420-1453 OT Time Calculation (min): 33 min Charges:  OT  General Charges $OT Visit: 1 Visit OT Evaluation $OT Eval Low Complexity: 1 Low OT Treatments $Self Care/Home Management : 8-22 mins  Ailene Ravel, OTR/L,CBIS  [phone]   Casondra Gasca, Clarene Duke 07/11/2020, 5:32 PM

## 2020-07-11 NOTE — Progress Notes (Signed)
PROGRESS NOTE   Martha [name]  FGH:[id number] DOB: 1966/04/18 DOA: 07/05/2020 PCP: Emelia Loron, NP  Brief Narrative:  54 year old white female BMI 48 OSA on CPAP ESRD MWF left upper extremity Bipolar DM TY 2 HTN Trimalleolar ankle fracture 11/2018  Admit 07/05/2020 left arm pain-on work-up found to have new bibasilar airspace disease concerning for pneumonia and may be ascending urinary infection started on ceftriaxone azithromycin on admission  Assessment & Plan:   Principal Problem:   CAP (community acquired pneumonia) Active Problems:   Morbid obesity ([place])   Essential hypertension   GERD   Sleep apnea   Type 2 diabetes mellitus treated with insulin (HCC)   Hyperglycemia due to diabetes mellitus ([place])   Bipolar 1 disorder (HCC)   ESRD (end stage renal disease) on dialysis ([place])   UTI (urinary tract infection)   Hypoalbuminemia   Acute respiratory failure with hypoxia ([place])   1. Left arm pain a. Resolved-seems improved and is tolerating HD via LUE access, which doesn't seem infected b. No further work-up--treating apparently for gout with prednisone as well as with colchicine 0.6 q. other day 2. MRSA pneumonia + bacteremia a. Seen by ID-rec's 6 weeks abx which will end 08/09/19 b. 2D echo without vegetations we will empirically treat as above 3. ESRD MWF LUE a. Referred to nephrology for further planning b. Check phosphorus etc. in a.m. resume calcium carbonate 4 tablets cumulative 2 tablets with snack holding Demadex c. Iron stores are reasonable with borderline no saturation ratios on 12/13-recheck in 3 to 4 weeks hemoglobin good d. Holding Demadex from prior to admission e. Resume Hectorol 0.5 MWF dialysis, Sensipar 30 daily 4. DM TY 2 a. CBGs ranging 1 36-2 10 eating 50% of meals on carb moderate diet b. Continue sliding scale supplementation c. Home regimen 30 units Lantus, sliding scale 2 to 15 units 3 times daily d. Gabapentin 300 cut back to  100 5. HTN a. Currently not on meds 6. Bipolar a. Continue Paxil 20 daily 7. OSA on CPAP 8. BMI 48  DVT prophylaxis: Heparin 3 times daily Code Status: Full Family Communication: None Disposition:  Status is: Inpatient  Remains inpatient appropriate because:Persistent severe electrolyte disturbances and Altered mental status   Dispo: The patient is from: Home              Anticipated d/c is to: SNF              Anticipated d/c date is: > 3 days              Patient currently is not medically stable to d/c.       Consultants:   Nephrology  Procedures: None  Antimicrobials: None   Subjective: Awake alert coherent tells me that she is legally blind No fever no chills Eating drinking No chest pain   Objective: Vitals:   07/11/20 0126 07/11/20 0400 07/11/20 0500 07/11/20 0810  BP: (!) 171/69 (!) 189/74    Pulse:      Resp: 11 14    Temp:   97.9 F (36.6 C) 98.5 F (36.9 C)  TempSrc:   Oral Oral  SpO2:      Weight:      Height:        Intake/Output Summary (Last 24 hours) at 07/11/2020 1037 Last data filed at 07/11/2020 0430 Gross per 24 hour  Intake 240 ml  Output --  Net 240 ml   Filed Weights   07/08/20 0444 07/09/20 1140 07/09/20 1530  Weight: 131.5 kg (!) 141.5 kg (!) 139.8 kg    Examination: EOMI NCAT no focal deficit poor visual acuity Chest clear no added sound no rales no rhonchi S1-S2 no murmur no rub no gallop Abdomen soft no rebound no guarding No lower extremity edema Neurologically intact moving 4 limbs equally without focal deficit Sensory grossly intact Neurologically intact as above   Data Reviewed: I have personally reviewed following labs and imaging studies   Radiology Studies: No results found.   Scheduled Meds: . atorvastatin  20 mg Oral Daily  . calcium carbonate  2-4 tablet Oral See admin instructions  . Chlorhexidine Gluconate Cloth  6 each Topical Q0600  . cinacalcet  30 mg Oral Daily  . colchicine  0.6 mg  Oral QODAY  . darbepoetin (ARANESP) injection - DIALYSIS  25 mcg Intravenous Q Wed-HD  . dextromethorphan-guaiFENesin  1 tablet Oral BID  . dorzolamide-timolol  1 drop Both Eyes BID  . [START ON 07/13/2020] doxercalciferol  1 mcg Intravenous Q M,W,F-HD  . feeding supplement (GLUCERNA SHAKE)  237 mL Oral TID BM  . gabapentin  100 mg Oral QHS  . heparin  5,000 Units Subcutaneous Q8H  . insulin aspart  0-5 Units Subcutaneous QHS  . insulin aspart  0-6 Units Subcutaneous TID WC  . mouth rinse  15 mL Mouth Rinse BID  . pantoprazole  40 mg Oral Daily  . PARoxetine  20 mg Oral QPM  . predniSONE  40 mg Oral Q breakfast  . sevelamer carbonate  800 mg Oral TID WC   Continuous Infusions: . sodium chloride    . sodium chloride    . vancomycin 1,000 mg (07/09/20 1415)     LOS: 6 days    Time spent: [place], MD [facility] To contact the attending provider between 7A-7P or the covering provider during after hours 7P-7A, please log into the web site [email or web address] and access using universal [place] password for that web site. If you do not have the password, please call the hospital operator.  07/11/2020, 10:37 AM

## 2020-07-11 NOTE — Progress Notes (Signed)
Patient ID: Martha [name], female   DOB: 27-May-1966, 54 y.o.   MRN: [id number] S: Feels much better today. O:BP (!) 189/74   Pulse 74   Temp 98.5 F (36.9 C) (Oral)   Resp 14   Ht 5\' 7"  (1.702 m)   Wt (!) 139.8 kg   LMP 08/03/2008 (Exact Date)   SpO2 (!) 89% Comment: placed back on 2 lpm nasal cannula  BMI 48.27 kg/m   Intake/Output Summary (Last 24 hours) at 07/11/2020 0951 Last data filed at 07/11/2020 0430 Gross per 24 hour  Intake 480 ml  Output --  Net 480 ml   Intake/Output: I/O last 3 completed shifts: In: 680 [P.O.:480; IV Piggyback:200] Out: -   Intake/Output this shift:  No intake/output data recorded. Weight change:  Gen: NAD CVS: RRR Resp: cta Abd: +BS, soft, NT/ND Ext: no edema, LUE AVF +T/B, left wrist less tender and no swelling  Recent Labs  Lab 07/05/20 1813 07/06/20 0500 07/07/20 0334 07/08/20 0356 07/09/20 0451  NA 132* 134* 132* 132* 132*  K 4.6 4.6 3.8 4.3 4.9  CL 91* 92* 91* 91* 89*  CO2 22 23 28 26 25   GLUCOSE 158* 130* 167* 119* 121*  BUN 57* 68* 35* 55* 72*  CREATININE 12.35* 13.55* 7.46* 10.15* 11.80*  ALBUMIN 3.3* 3.0*  --   --   --   CALCIUM 8.3* 8.8* 8.5* 8.5* 8.6*  PHOS  --  6.5*  --   --   --   AST 29 16  --   --   --   ALT 38 26  --   --   --    Liver Function Tests: Recent Labs  Lab 07/05/20 1813 07/06/20 0500  AST 29 16  ALT 38 26  ALKPHOS 146* 130*  BILITOT 1.6* 0.8  PROT 8.1 7.7  ALBUMIN 3.3* 3.0*   Recent Labs  Lab 07/05/20 1813  LIPASE 23   No results for input(s): AMMONIA in the last 168 hours. CBC: Recent Labs  Lab 07/06/20 0500 07/07/20 0334 07/08/20 0356 07/09/20 0451 07/10/20 0413  WBC 11.2* 10.8* 10.5 11.9* 10.5  NEUTROABS  --  8.7* 8.5* 9.9* 9.2*  HGB 10.7* 9.7* 9.8* 9.6* 10.3*  HCT 35.2* 31.5* 32.8* 31.4* 33.9*  MCV 95.9 96.3 98.2 96.3 95.8  PLT 234 202 199 213 206   Cardiac Enzymes: No results for input(s): CKTOTAL, CKMB, CKMBINDEX, TROPONINI in the last 168 hours. CBG: Recent Labs   Lab 07/10/20 0820 07/10/20 1115 07/10/20 1621 07/10/20 2049 07/11/20 0807  GLUCAP 148* 136* 216* 210* 197*    Iron Studies:  Recent Labs    07/09/20 1302  IRON 36  TIBC 159*  FERRITIN 2,821*   Studies/Results: No results found. Marland Kitchen atorvastatin  20 mg Oral Daily  . Chlorhexidine Gluconate Cloth  6 each Topical Q0600  . colchicine  0.6 mg Oral QODAY  . darbepoetin (ARANESP) injection - DIALYSIS  25 mcg Intravenous Q Wed-HD  . dextromethorphan-guaiFENesin  1 tablet Oral BID  . feeding supplement (GLUCERNA SHAKE)  237 mL Oral TID BM  . gabapentin  100 mg Oral QHS  . heparin  5,000 Units Subcutaneous Q8H  . insulin aspart  0-5 Units Subcutaneous QHS  . insulin aspart  0-6 Units Subcutaneous TID WC  . mouth rinse  15 mL Mouth Rinse BID  . pantoprazole  40 mg Oral Daily  . PARoxetine  20 mg Oral QPM  . predniSONE  40 mg Oral Q breakfast  .  sevelamer carbonate  800 mg Oral TID WC    BMET    Component Value Date/Time   NA 132 (L) 07/09/2020 0451   K 4.9 07/09/2020 0451   CL 89 (L) 07/09/2020 0451   CO2 25 07/09/2020 0451   GLUCOSE 121 (H) 07/09/2020 0451   BUN 72 (H) 07/09/2020 0451   CREATININE 11.80 (H) 07/09/2020 0451   CALCIUM 8.6 (L) 07/09/2020 0451   CALCIUM 9.4 04/10/2017 0425   GFRNONAA 4 (L) 07/09/2020 0451   GFRAA 8 (L) 03/06/2020 0215   CBC    Component Value Date/Time   WBC 10.5 07/10/2020 0413   RBC 3.54 (L) 07/10/2020 0413   HGB 10.3 (L) 07/10/2020 0413   HGB 11.5 03/16/2013 0000   HCT 33.9 (L) 07/10/2020 0413   HCT 29.3 (L) 04/06/2017 0357   HCT 36 03/16/2013 0000   PLT 206 07/10/2020 0413   MCV 95.8 07/10/2020 0413   MCV 87.8 03/16/2013 0000   MCH 29.1 07/10/2020 0413   MCHC 30.4 07/10/2020 0413   RDW 16.5 (H) 07/10/2020 0413   LYMPHSABS 0.8 07/10/2020 0413   MONOABS 0.3 07/10/2020 0413   EOSABS 0.0 07/10/2020 0413   BASOSABS 0.0 07/10/2020 0413     Assessment/Plan:  1. MRSA bacteremia presumably due to pneumonia- on vancomycin for  6 weeks per ID pending TEE. 2. ESRD- continue with HD on MWF schedule.  Doing well.  3. Pain of left hand/wrist- appears more musculoskeletal and no evidence of steal syndrome on exam.  She reports marked improvement of her pain and mobility after starting prednisone taper yesterday.  She was able to tolerate HD using the fistula on Monday without issue.  Agree with short taper of prednisone for possible gout.  CT scan negative for osteo or septic joint. 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 5. HTN/volume- UF as tolerated. 6. SHPTH- continue with binders and follow. 7. DM type 2- per primary 8. OSA on CPAP 1.   Donetta Potts, MD [employer] [phone]

## 2020-07-11 NOTE — Procedures (Signed)
HEMODIALYSIS TREATMENT NOTE:  Uneventful 3.5 hour heparin-free dialysis tx completed via LUA AVF (16g/antegrade). Goal met: 1.5 liters removed.  UF was interrupted x 15 minutes for asymptomatic hypotension.  All blood was returned and hemostasis was achieved in 15 minutes.  No changes from pre-dialysis assessment.  Rockwell [place], RN

## 2020-07-11 NOTE — Plan of Care (Signed)
  Problem: Acute Rehab OT Goals (only OT should resolve) Goal: Pt. Will Perform Upper Body Bathing Flowsheets (Taken 07/11/2020 1743) Pt Will Perform Upper Body Bathing:  with mod assist  sitting Goal: Pt. Will Perform Upper Body Dressing Flowsheets (Taken 07/11/2020 1743) Pt Will Perform Upper Body Dressing:  with mod assist  sitting Goal: Pt. Will Perform Toileting-Clothing Manipulation Flowsheets (Taken 07/11/2020 1743) Pt Will Perform Toileting - Clothing Manipulation and hygiene:  with max assist  sit to/from stand  sitting/lateral leans Goal: Pt/Caregiver Will Perform Home Exercise Program Flowsheets (Taken 07/11/2020 1743) Pt/caregiver will Perform Home Exercise Program:  Increased ROM  Left upper extremity  With minimal assist  With Supervision  With written HEP provided

## 2020-07-12 LAB — COMPREHENSIVE METABOLIC PANEL
ALT: 14 U/L (ref 0–44)
AST: 10 U/L — ABNORMAL LOW (ref 15–41)
Albumin: 2.7 g/dL — ABNORMAL LOW (ref 3.5–5.0)
Alkaline Phosphatase: 98 U/L (ref 38–126)
Anion gap: 15 (ref 5–15)
BUN: 55 mg/dL — ABNORMAL HIGH (ref 6–20)
CO2: 25 mmol/L (ref 22–32)
Calcium: 8.7 mg/dL — ABNORMAL LOW (ref 8.9–10.3)
Chloride: 91 mmol/L — ABNORMAL LOW (ref 98–111)
Creatinine, Ser: 6.05 mg/dL — ABNORMAL HIGH (ref 0.44–1.00)
GFR, Estimated: 8 mL/min — ABNORMAL LOW (ref >60)
Glucose, Bld: 177 mg/dL — ABNORMAL HIGH (ref 70–99)
Potassium: 4.1 mmol/L (ref 3.5–5.1)
Sodium: 131 mmol/L — ABNORMAL LOW (ref 135–145)
Total Bilirubin: 1.2 mg/dL (ref 0.3–1.2)
Total Protein: 7.2 g/dL (ref 6.5–8.1)

## 2020-07-12 LAB — CULTURE, BLOOD (ROUTINE X 2)
Culture: NO GROWTH
Special Requests: ADEQUATE

## 2020-07-12 LAB — RENAL FUNCTION PANEL
Albumin: 2.6 g/dL — ABNORMAL LOW (ref 3.5–5.0)
Anion gap: 17 — ABNORMAL HIGH (ref 5–15)
BUN: 105 mg/dL — ABNORMAL HIGH (ref 6–20)
CO2: 23 mmol/L (ref 22–32)
Calcium: 8.7 mg/dL — ABNORMAL LOW (ref 8.9–10.3)
Chloride: 90 mmol/L — ABNORMAL LOW (ref 98–111)
Creatinine, Ser: 10.2 mg/dL — ABNORMAL HIGH (ref 0.44–1.00)
GFR, Estimated: 4 mL/min — ABNORMAL LOW (ref >60)
Glucose, Bld: 246 mg/dL — ABNORMAL HIGH (ref 70–99)
Phosphorus: 9.1 mg/dL — ABNORMAL HIGH (ref 2.5–4.6)
Potassium: 5.8 mmol/L — ABNORMAL HIGH (ref 3.5–5.1)
Sodium: 130 mmol/L — ABNORMAL LOW (ref 135–145)

## 2020-07-12 LAB — GLUCOSE, CAPILLARY
Glucose-Capillary: 165 mg/dL — ABNORMAL HIGH (ref 70–99)
Glucose-Capillary: 156 mg/dL — ABNORMAL HIGH (ref 70–99)

## 2020-07-12 LAB — SARS CORONAVIRUS 2 BY RT PCR (HOSPITAL ORDER, PERFORMED IN ~~LOC~~ HOSPITAL LAB): SARS Coronavirus 2: NEGATIVE

## 2020-07-12 MED ORDER — GABAPENTIN 100 MG PO CAPS: 100.0000 mg | ORAL_CAPSULE | Freq: Every day | ORAL | 0 refills | Status: DC

## 2020-07-12 MED ORDER — BASAGLAR KWIKPEN 100 UNIT/ML ~~LOC~~ SOPN: 30.0000 [IU] | PEN_INJECTOR | Freq: Every day | SUBCUTANEOUS | 0 refills | Status: DC

## 2020-07-12 MED ORDER — SEVELAMER CARBONATE 800 MG PO TABS: 800.0000 mg | ORAL_TABLET | Freq: Three times a day (TID) | ORAL | 0 refills | Status: DC

## 2020-07-12 MED ORDER — PREDNISONE 20 MG PO TABS: 40.0000 mg | ORAL_TABLET | Freq: Every day | ORAL | 0 refills | Status: DC

## 2020-07-12 MED ORDER — PAROXETINE HCL 20 MG PO TABS: 20.0000 mg | ORAL_TABLET | Freq: Every evening | ORAL | 0 refills | Status: DC

## 2020-07-12 MED ORDER — ALPRAZOLAM 1 MG PO TABS: 1.0000 mg | ORAL_TABLET | Freq: Four times a day (QID) | ORAL | 0 refills | Status: DC | PRN

## 2020-07-12 MED ORDER — HYDROCODONE-ACETAMINOPHEN 10-325 MG PO TABS: 1.0000 | ORAL_TABLET | Freq: Four times a day (QID) | ORAL | 0 refills | Status: AC | PRN

## 2020-07-12 MED ORDER — ONDANSETRON HCL 4 MG PO TABS
4.0000 mg | ORAL_TABLET | Freq: Four times a day (QID) | ORAL | Status: DC | PRN
  Administered 2020-07-12: 4 mg via ORAL
  Filled 2020-07-12: qty 1

## 2020-07-12 MED ORDER — FAMOTIDINE 20 MG PO TABS: 20.0000 mg | ORAL_TABLET | Freq: Every day | ORAL | Status: DC

## 2020-07-12 MED ORDER — BUSPIRONE HCL 7.5 MG PO TABS: 7.5000 mg | ORAL_TABLET | Freq: Two times a day (BID) | ORAL | 0 refills | Status: DC

## 2020-07-12 NOTE — TOC Transition Note (Signed)
Transition of Care [facility]) - CM/SW Discharge Note   Patient Details  Name: Martha [name] MRN: [id number] Date of Birth: 1965/10/05  [facility] CM/SW Contact:  Natasha Bence, LCSW Phone Number: 07/12/2020, 2:54 PM   Clinical Narrative:    CSW followed up with Jackelyn Poling from [place] to discuss patient's financial plan to assist patient with rent due to the fact that patient will need to sign over her check during her stay. Debbie with [employer] reported that after speaking with her Education officer, environmental, it was uncovered that patient had an outstanding balance from 2020 and would not be able to return until the balance was paid. CSW notified patients daughter. Patient's daughter also reported that patient has a cap aide. Patients daughter agreeable to OPPT referral. CSW referred patient for OPPT with [facility]. CSW faxed patient's OPAT to [facility] and confirmed they would be able to provide IV antibiotics in addition to dialysis. CSW also called [facility] to confirm that patient will be able to utilize their services for OPPT. Rep with [facility] reported that patient will be able to utilize services but will need to call once OPPT appt times are established. CSW discussed information with patient's daughter. Patient's daughter reported that she will be providing transportation for patient. TOC signing off.   Final next level of care: OP Rehab Barriers to Discharge: Barriers Resolved   Patient Goals and CMS Choice Patient states their goals for this hospitalization and ongoing recovery are:: Return home with cap aid and OPPT CMS Medicare.gov Compare Post Acute Care list provided to:: Patient Choice offered to / list presented to : [place]  Discharge Placement                Patient to be transferred to facility by: Family Name of family member notified: Neysa Hotter Patient and family notified of of transfer: 07/12/20  Discharge Plan and Services In-house Referral:  Clinical Social Work Discharge Planning Services: NA Post Acute Care Choice: [place]          DME Arranged: N/A DME Agency: NA       HH Arranged: NA [place] Agency: NA        Social Determinants of Health ([place]) Interventions     Readmission Risk Interventions Readmission Risk Prevention Plan 07/09/2020  Transportation Screening Complete  Medication Review Press photographer) Complete  PCP or Specialist appointment within 3-5 days of discharge Not Complete  PCP/Specialist Appt Not Complete comments Discharging to SNF  [place] or Home Care Consult Complete  SW Recovery Care/Counseling Consult Complete  Palliative Care Screening Not Applicable  Skilled Nursing Facility Complete  Some recent data might be hidden

## 2020-07-12 NOTE — TOC Transition Note (Deleted)
Transition of Care [facility]) - CM/SW Discharge Note   Patient Details  Name: Martha [name] MRN: [id number] Date of Birth: 01/11/1966  Transition [facility] Care [facility]) CM/SW Contact:  Natasha Bence, LCSW Phone Number: 07/12/2020, 2:35 PM   Clinical Narrative:    CSW followed up with Jackelyn Poling from [place] to discuss patient's financial plan to assist patient with rent due to the fact that patient will need to sign over her check during her stay. Debbie with [facility] reported that after speaking with her Education officer, environmental, it was uncovered that patient had an outstanding balance from 2020 and would not be able to return until the balance was paid. CSW notified patients daughter. Patients daughter agreeable to OPPT referral. CSW referred patient for OPPT with [facility]. CSW faxed patient's OPAT to [facility] and confirmed they would be able to provide IV antibiotics in addition to dialysis. CSW also called [facility] to confirm that patient will be able to utilize their services for OPPT. Rep with [facility] reported that patient will be able to utilize services but will need to call once OPPT appt times are established. CSW discussed information with patient's daughter. Patient's daughter reported that she will be providing transportation for patient. TOC signing off.   Final next level of care: Skilled Nursing Facility Barriers to Discharge: Continued Medical Work up   Patient Goals and CMS Choice Patient states their goals for this hospitalization and ongoing recovery are:: Got to rehab CMS Medicare.gov Compare Post Acute Care list provided to:: Patient Choice offered to / list presented to : Patient  Discharge Placement                       Discharge Plan and Services In-house Referral: Clinical Social Work Discharge Planning Services: NA Post Acute Care Choice: [place]          DME Arranged: N/A DME Agency: NA       HH Arranged: NA HH Agency: NA        Social  Determinants of Health ([place]) Interventions     Readmission Risk Interventions Readmission Risk Prevention Plan 07/09/2020  Transportation Screening Complete  Medication Review Press photographer) Complete  PCP or Specialist appointment within 3-5 days of discharge Not Complete  PCP/Specialist Appt Not Complete comments Discharging to SNF  [place] or Home Care Consult Complete  SW Recovery Care/Counseling Consult Complete  Palliative Care Screening Not Applicable  Skilled Nursing Facility Complete  Some recent data might be hidden

## 2020-07-12 NOTE — Discharge Summary (Signed)
Physician Discharge Summary  Martha [name] DGU:[id number] DOB: 17-Aug-1965 DOA: 07/05/2020  PCP: Emelia Loron, NP  Admit date: 07/05/2020 Discharge date: 07/12/2020  Time spent: 35 minutes  Recommendations for Outpatient Follow-up:  1. Needs completion of vancomycin 08/17/2020 for treatment of MRSA bacteremia to be given with dialysis Monday Wednesday Friday 2. Needs screening labs Chem-12, CBC, CRP every week until completion of therapy 3. Titrate insulin as needed at facility may need sliding scale dosage change to much lower dose of long-acting on discharge 4. Note dosage change of gabapentin this admission to a lower dose Taper steroids, colchicine in the outpatient setting given her left arm pain w given prescription well-controlled medication 5. Check iron stores in the outpatient setting in about 3 weeks  Discharge Diagnoses:  Principal Problem:   CAP (community acquired pneumonia) Active Problems:   Morbid obesity (Martha [name])   Essential hypertension   GERD   Sleep apnea   Type 2 diabetes mellitus treated with insulin (HCC)   Hyperglycemia due to diabetes mellitus (Martha [name])   Bipolar 1 disorder (HCC)   ESRD (end stage renal disease) on dialysis [name])   UTI (urinary tract infection)   Hypoalbuminemia   Acute respiratory failure with hypoxia [name])   Discharge Condition: Improved   Diet recommendation: Renal diabetic fluid restricted  Filed Weights   07/09/20 1530 07/11/20 1900 07/11/20 2305  Weight: (!) 139.8 kg (!) 141.2 kg (!) 139.8 kg    History of present illness:  54 year old white female BMI 48 OSA on CPAP ESRD MWF left upper extremity Bipolar DM TY 2 HTN Trimalleolar ankle fracture 11/2018  Admit 07/05/2020 left arm pain-on work-up found to have new bibasilar airspace disease concerning for pneumonia and may be ascending urinary infection started on ceftriaxone azithromycin on admission  Eventually found to have [place] [facility] Course:   1. Left arm  pain a. Resolved-seems improved and is tolerating HD via LUE access, which doesn't seem infected b. No further work-up--treating apparently for gout with prednisone as well as with colchicine 0.6 q. other day and this will need to be tapered in the outpatient setting with complete full course of prednisone 2. MRSA pneumonia + bacteremia a. Seen by ID-rec's 6 weeks abx which will end 08/18/19 b. 2D echo without vegetations we will empirically treat as above c. Outpatient follow-up with Dr. Hulen Luster of ID 3. ESRD MWF LUE a. Referred to nephrology for further planning b. Phosphorus acceptable this admission in a.m. resume calcium carbonate 4 tablets cumulative 2 tablets with snack holding Demadex c. Iron stores are reasonable with borderline no saturation ratios on 12/13-recheck in 3 to 4 weeks hemoglobin good d. Holding Demadex from prior to admission e. Resume Hectorol 0.5 MWF dialysis, Sensipar 30 daily 4. DM TY 2 a. CBGs rangin acceptable below 200 eating 50% of meals on carb moderate diet did not require Lantus initially b. Continue sliding scale supplementation c. Home regimen 30 units Lantus changes admission to lower dose, sliding scale 2 to 15 units 3 times daily d. Gabapentin 300 cut back to 100 5. HTN a. Currently not on meds 6. Bipolar a. Continue Paxil 20 daily 7. OSA on CPAP 8. BMI 48   Procedures:  TTE   Consultations:  Nephrology  Infectious disease  Discharge Exam: Vitals:   07/11/20 2352 07/12/20 0523  BP:  (!) 154/73  Pulse: 68 (!) 59  Resp: 18 19  Temp:  97.6 F (36.4 C)  SpO2: 98% 99%    General: Awake coherent  no distress EOMI NCAT no focal deficit Cardiovascular: S1-S2 no murmur Respiratory: Clear no added sound Slight swelling of left forearm and left wrist no pain in the fingers but quite tender to passive range of motion Abdomen is soft no rebound no guarding Neurologically intact moving all 4 limbs equally Psych euthymic pleasant  Discharge  Instructions   Discharge Instructions    [facility] pharmacist to adjust dose for Vancomycin, Aminoglycosides and other anti-infective therapies as requested by physician.   Complete by: As directed    [facility] to provide Cath Flo 450m   Complete by: As directed    Administer for PICC line occlusion and as ordered by physician for other access device issues.   Anaphylaxis Kit: Provided to treat any anaphylactic reaction to the medication being provided to the patient if First Dose or when requested by physician   Complete by: As directed    Epinephrine 125mml vial / amp: Administer 0.50m17m0.50ml26mubcutaneously once for moderate to severe anaphylaxis, nurse to call physician and pharmacy when reaction occurs and call 911 if needed for immediate care   Diphenhydramine 50mg17mIV vial: Administer 25-50mg 34mM PRN for first dose reaction, rash, itching, mild reaction, nurse to call physician and pharmacy when reaction occurs   Sodium Chloride 0.9% NS 500ml I750mdminister if needed for hypovolemic blood pressure drop or as ordered by physician after call to physician with anaphylactic reaction   Change dressing on IV access line weekly and PRN   Complete by: As directed    Diet - low sodium heart healthy   Complete by: As directed    Flush IV access with Sodium Chloride 0.9% and Heparin 10 units/ml or 100 units/ml   Complete by: As directed    Home infusion instructions - [facility]   Complete by: As directed    Instructions: Flush IV access with Sodium Chloride 0.9% and Heparin 10units/ml or 100units/ml   Change dressing on IV access line: Weekly and PRN   Instructions Cath Flo 2mg: Ad54mister for PICC Line occlusion and as ordered by physician for other access device   [facility] pharmacist to adjust dose for: Vancomycin, Aminoglycosides and other anti-infective therapies as requested by physician   Increase activity slowly   Complete by: As  directed    Method of administration may be changed at the discretion of home infusion pharmacist based upon assessment of the patient and/or caregiver's ability to self-administer the medication ordered   Complete by: As directed    No wound care   Complete by: As directed      Allergies as of 07/12/2020      Reactions   Codeine Nausea And Vomiting   Tape Rash, Other (See Comments)   Pull skin off.  Paper tape is ok      Medication List    STOP taking these medications   atorvastatin 20 MG tablet Commonly known as: LIPITOR   baclofen 10 MG tablet Commonly known as: LIORESAL   cetirizine 10 MG tablet Commonly known as: ZYRTEC   clindamycin 300 MG capsule Commonly known as: CLEOCIN   cloNIDine 0.1 MG tablet Commonly known as: CATAPRES   diphenhydrAMINE 25 mg capsule Commonly known as: BENADRYL   epoetin alfa 10000 UNIT/ML injection Commonly known as: EPOGEN   latanoprost 0.005 % ophthalmic solution Commonly known as: XALATAN   losartan 25 MG tablet Commonly known as: COZAAR   ondansetron 4 MG tablet Commonly known as: ZOFRAN  polyethylene glycol 17 g packet Commonly known as: MIRALAX / GLYCOLAX   torsemide 100 MG tablet Commonly known as: DEMADEX     TAKE these medications   albuterol (2.5 MG/3ML) 0.083% nebulizer solution Commonly known as: PROVENTIL Take 2.5 mg by nebulization every 6 (six) hours as needed for wheezing or shortness of breath.   ALPRAZolam 1 MG tablet Commonly known as: XANAX Take 1 tablet (1 mg total) by mouth 4 (four) times daily as needed for anxiety.   Basaglar KwikPen 100 UNIT/ML Inject 30 Units into the skin at bedtime.   bisacodyl 10 MG suppository Commonly known as: DULCOLAX Place 1 suppository (10 mg total) rectally daily as needed for moderate constipation.   busPIRone 7.5 MG tablet Commonly known as: BUSPAR Take 1 tablet (7.5 mg total) by mouth 2 (two) times daily.   calcium carbonate 750 MG chewable  tablet Commonly known as: TUMS EX Chew 2-4 tablets by mouth See admin instructions. Take 4 tablet with each meal & take 2 tablets with each snack   cinacalcet 30 MG tablet Commonly known as: SENSIPAR Take 30 mg by mouth daily.   colchicine 0.6 MG tablet Take 1 tablet (0.6 mg total) by mouth every Tuesday, Thursday, and Saturday at 6 PM. Take 1 tablet every other day What changed: additional instructions   cyclobenzaprine 10 MG tablet Commonly known as: FLEXERIL Take 1 tablet (10 mg total) by mouth 2 (two) times daily as needed for muscle spasms.   dorzolamide-timolol 22.3-6.8 MG/ML ophthalmic solution Commonly known as: COSOPT Place 1 drop into the right eye in the morning and at bedtime.   doxercalciferol 4 MCG/2ML injection Commonly known as: HECTOROL Inject 0.5 mLs (1 mcg total) into the vein every Monday, Wednesday, and Friday with hemodialysis.   famotidine 20 MG tablet Commonly known as: PEPCID Take 1 tablet (20 mg total) by mouth daily. Start taking on: July 13, 2020   gabapentin 100 MG capsule Commonly known as: NEURONTIN Take 1 capsule (100 mg total) by mouth at bedtime. What changed:   medication strength  how much to take   HYDROcodone-acetaminophen 10-325 MG tablet Commonly known as: NORCO Take 1 tablet by mouth every 6 (six) hours as needed for up to 5 days for severe pain (pain.). Prescription needed for SNF admission What changed: reasons to take this   insulin aspart 100 UNIT/ML injection Commonly known as: novoLOG Inject 0-9 Units into the skin 3 (three) times daily with meals. What changed:   how much to take  additional instructions   lidocaine-prilocaine cream Commonly known as: EMLA Apply 1 application topically as needed (topical anesthesia for hemodialysis if Gebauers and Lidocaine injection are ineffective.). What changed:   when to take this  reasons to take this   omeprazole 40 MG capsule Commonly known as: PRILOSEC Take 40  mg by mouth daily.   PARoxetine 20 MG tablet Commonly known as: PAXIL Take 1 tablet (20 mg total) by mouth every evening. This is to prevent panic attacks   predniSONE 20 MG tablet Commonly known as: DELTASONE Take 2 tablets (40 mg total) by mouth daily with breakfast. Start taking on: July 13, 2020   sevelamer carbonate 800 MG tablet Commonly known as: RENVELA Take 1 tablet (800 mg total) by mouth 3 (three) times daily with meals.   vancomycin  IVPB Inject 1,000 mg into the vein every Monday, Wednesday, and Friday with hemodialysis. Indication:  MRSA Bacteremia First Dose: No Last Day of Therapy:  08/17/2020 Labs - Sunday/Monday:  CBC/D, BMP, and vancomycin trough. Labs - Thursday:  BMP and vancomycin trough Labs - Every other week:  ESR and CRP Method of administration:Elastomeric Method of administration may be changed at the discretion of the patient and/or caregiver's ability to self-administer the medication ordered.            Discharge Care Instructions  (From admission, onward)         Start     Ordered   07/10/20 0000  Change dressing on IV access line weekly and PRN  (Home infusion instructions - [facility] )        07/10/20 0807         Allergies  Allergen Reactions  . Codeine Nausea And Vomiting  . Tape Rash and Other (See Comments)    Pull skin off.  Paper tape is ok      The results of significant diagnostics from this hospitalization (including imaging, microbiology, ancillary and laboratory) are listed below for reference.    Significant Diagnostic Studies: DG Forearm Left  Result Date: 07/02/2020 CLINICAL DATA:  Left forearm pain EXAM: LEFT FOREARM - 2 VIEW COMPARISON:  None. FINDINGS: No acute bony abnormality. Specifically, no fracture, subluxation, or dislocation. Vascular calcifications noted. IMPRESSION: No acute bony abnormality. Electronically Signed   By: Rolm Baptise M.D.   On: 07/02/2020 11:54   CT Abdomen Pelvis W  Contrast  Result Date: 07/05/2020 CLINICAL DATA:  Abdominal pain. EXAM: CT ABDOMEN AND PELVIS WITH CONTRAST TECHNIQUE: Multidetector CT imaging of the abdomen and pelvis was performed using the standard protocol following bolus administration of intravenous contrast. CONTRAST:  186mL OMNIPAQUE IOHEXOL 300 MG/ML  SOLN COMPARISON:  CT dated July 04, 2020 FINDINGS: Lower chest: There is new bibasilar airspace disease concerning for pneumonia.The heart is enlarged. Hepatobiliary: The liver is normal. Status post cholecystectomy.There is no biliary ductal dilation. Pancreas: Normal contours without ductal dilatation. No peripancreatic fluid collection. Spleen: The spleen is enlarged. 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 Adrenals/Urinary Tract: --Adrenal glands: Unremarkable. --Right kidney/ureter: The right kidney is atrophic without evidence for hydronephrosis. --Left kidney/ureter: The left kidney is atrophic. There is mild left-sided collecting system dilatation with asymmetric fat stranding about the left ureter. --Urinary bladder: Unremarkable. Stomach/Bowel: --Stomach/Duodenum: No hiatal hernia or other gastric abnormality. Normal duodenal course and caliber. --Small bowel: Unremarkable. --Colon: Unremarkable. --Appendix: Normal. Vascular/Lymphatic: Atherosclerotic calcification is present within the non-aneurysmal abdominal aorta, without hemodynamically significant stenosis. --No retroperitoneal lymphadenopathy. --No mesenteric lymphadenopathy. --No pelvic or inguinal lymphadenopathy. Reproductive: Unremarkable Other: No ascites or free air. The abdominal wall is normal. Musculoskeletal. No acute displaced fractures. IMPRESSION: 1. New bibasilar airspace disease concerning for pneumonia. 2. Mild left-sided collecting system dilatation with asymmetric fat stranding about the left ureter. Correlation with urinalysis is recommended as findings may  represent an ascending urinary tract infection. 3. 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 Aortic Atherosclerosis (ICD10-I70.0). Electronically Signed   By: Constance Holster M.D.   On: 07/05/2020 21:15   CT WRIST LEFT W CONTRAST  Result Date: 07/07/2020 CLINICAL DATA:  Left forearm pain tendons.  MRSA bacteremia. EXAM: CT OF THE UPPER LEFT EXTREMITY WITH CONTRAST TECHNIQUE: Multidetector CT imaging of the upper left extremity was performed according to the standard protocol following intravenous contrast administration. CONTRAST:  1mL OMNIPAQUE IOHEXOL 300 MG/ML  SOLN COMPARISON:  Radiographs 07/05/2020 FINDINGS: Very limited examination due to positioning artifact patient's adjacent body. The bony structures  are grossly intact. No obvious destructive bony changes to suggest septic arthritis or osteomyelitis. No obvious subcutaneous abscess or findings for pyomyositis. IMPRESSION: 1. Very limited examination. 2. No obvious destructive bony changes to suggest septic arthritis or osteomyelitis. 3. No obvious subcutaneous abscess or findings for pyomyositis. Electronically Signed   By: Marijo Sanes M.D.   On: 07/07/2020 10:52   [place] UPPER EXTREMITY DUPLEX LEFT (NON-WBI)  Result Date: 07/07/2020 CLINICAL DATA:  Forearm pain x1 week. Left AV fistula placement January 2020. Bacteremia. Diabetes, obesity. EXAM: LEFT UPPER EXTREMITY ARTERIAL DUPLEX SCAN TECHNIQUE: Gray-scale sonography as well as color Doppler and duplex ultrasound was performed to evaluate the arteries of the upper extremity. COMPARISON:  CT from the same day FINDINGS: Subclavian artery normal arterial waveform and color Doppler signal fell elevated velocities up to 259 cm/seconds. Axillary artery low resistance arterial waveform with elevated velocities up to 319 cm/seconds. Brachial artery antegrade low resistance arterial waveform with elevated velocities throughout up to 523  cm/seconds Patency of brachiocephalic fistula with fusiform dilatation of the outflow vein, no intraluminal thrombus. Radial artery high resistance arterial waveform with normal color Doppler signal. Ulnar artery normal high resistance arterial waveform with no longer normal color Doppler signal and velocities. IMPRESSION: 1. Patent left brachiocephalic fistula with expected elevated velocities in the proximal arterial inflow. 2. Patency of radial and ulnar arteries. Electronically Signed   By: Lucrezia Europe M.D.   On: 07/07/2020 16:02   DG Chest Port 1 View  Result Date: 07/05/2020 CLINICAL DATA:  Questionable sepsis - evaluate for abnormality EXAM: PORTABLE CHEST 1 VIEW COMPARISON:  07/02/2020 and prior FINDINGS: No focal consolidation, pneumothorax or pleural effusion. Mild cardiomegaly, unchanged. No acute osseous abnormality. IMPRESSION: No focal airspace disease. Unchanged mild cardiomegaly. Electronically Signed   By: Primitivo Gauze M.D.   On: 07/05/2020 19:02   DG Chest Port 1 View  Result Date: 07/02/2020 CLINICAL DATA:  SOB EXAM: PORTABLE CHEST 1 VIEW COMPARISON:  08/25/2019 chest radiograph and prior. FINDINGS: No pneumothorax or pleural effusion. No focal consolidation. Cardiomegaly and prominence of the central pulmonary vessels. No acute osseous abnormality. IMPRESSION: Cardiomegaly and prominence of the central pulmonary vessels. No focal consolidation. Electronically Signed   By: Primitivo Gauze M.D.   On: 07/02/2020 10:17   DG Hand Complete Left  Result Date: 07/02/2020 CLINICAL DATA:  Left forearm and hand pain. EXAM: LEFT HAND - COMPLETE 3+ VIEW COMPARISON:  None. FINDINGS: No acute bony abnormality. Specifically, no fracture, subluxation, or dislocation. Slight joint space narrowing in the IP joints. No bone erosions. Vascular calcifications noted. IMPRESSION: No acute bony abnormality. Electronically Signed   By: Rolm Baptise M.D.   On: 07/02/2020 11:54   ECHOCARDIOGRAM  COMPLETE  Result Date: 07/07/2020    ECHOCARDIOGRAM REPORT   Patient Name:   Martha [name] Date of Exam: 07/07/2020 Medical Rec #:  [id number]     Height:       67.0 in Accession #:    [id number]    Weight:       311.7 lb Date of Birth:  Dec 21, 1965     BSA:          2.442 m Patient Age:    10 years      BP:           161/56 mmHg Patient Gender: F             HR:           78 bpm. Exam Location:  [facility] Procedure: 2D Echo, Cardiac Doppler and Color Doppler Indications:    Bacteremia  History:        Patient has prior history of Echocardiogram examinations, most                 recent 04/07/2017. COPD, Signs/Symptoms:Shortness of Breath and                 Bacteremia; Risk Factors:Hypertension, Diabetes and Obesity.                 ESRD, MSSA, pneumonia, resp. failure,.  Sonographer:    Dustin Flock RDCS Referring Phys: [id number] [place]  1. Left ventricular ejection fraction, by estimation, is 55 to 60%. The left ventricle has normal function. The left ventricle has no regional wall motion abnormalities. There is mild concentric left ventricular hypertrophy. Left ventricular diastolic parameters are consistent with Grade I diastolic dysfunction (impaired relaxation). Elevated left atrial pressure.  2. Right ventricular systolic function is normal. The right ventricular size is normal.  3. Left atrial size was mildly dilated.  4. The mitral valve is normal in structure. Mild mitral valve regurgitation. No evidence of mitral stenosis.  5. The aortic valve is normal in structure. Aortic valve regurgitation is not visualized. No aortic stenosis is present.  6. The inferior vena cava is normal in size with greater than 50% respiratory variability, suggesting right atrial pressure of 3 mmHg. Conclusion(s)/Recommendation(s): No evidence of valvular vegetations on this transthoracic echocardiogram. Would recommend a transesophageal echocardiogram to exclude infective endocarditis if clinically  indicated. FINDINGS  Left Ventricle: Left ventricular ejection fraction, by estimation, is 55 to 60%. The left ventricle has normal function. The left ventricle has no regional wall motion abnormalities. The left ventricular internal cavity size was normal in size. There is  mild concentric left ventricular hypertrophy. Left ventricular diastolic parameters are consistent with Grade I diastolic dysfunction (impaired relaxation). Elevated left atrial pressure. Right Ventricle: The right ventricular size is normal. No increase in right ventricular wall thickness. Right ventricular systolic function is normal. Left Atrium: Left atrial size was mildly dilated. Right Atrium: Right atrial size was normal in size. Pericardium: There is no evidence of pericardial effusion. Mitral Valve: The mitral valve is normal in structure. Mild mitral valve regurgitation. No evidence of mitral valve stenosis. There is no evidence of mitral valve vegetation. Tricuspid Valve: The tricuspid valve is normal in structure. Tricuspid valve regurgitation is mild . No evidence of tricuspid stenosis. There is no evidence of tricuspid valve vegetation. Aortic Valve: The aortic valve is normal in structure. Aortic valve regurgitation is not visualized. No aortic stenosis is present. There is no evidence of aortic valve vegetation. Pulmonic Valve: The pulmonic valve was normal in structure. Pulmonic valve regurgitation is not visualized. No evidence of pulmonic stenosis. Aorta: The aortic root is normal in size and structure. Venous: The inferior vena cava is normal in size with greater than 50% respiratory variability, suggesting right atrial pressure of 3 mmHg. IAS/Shunts: No atrial level shunt detected by color flow Doppler.  LEFT VENTRICLE PLAX 2D LVIDd:         4.71 cm Diastology LVIDs:         2.56 cm LV e' medial:    4.46 cm/s LV PW:         1.12 cm LV E/e' medial:  27.4 LV IVS:        1.29 cm LV e' lateral:   7.94 cm/s  LV E/e' lateral: 15.4  RIGHT VENTRICLE RV Basal diam:  3.45 cm RV S prime:     10.90 cm/s TAPSE (M-mode): 2.1 cm LEFT ATRIUM           Index       RIGHT ATRIUM           Index LA diam:      3.80 cm 1.56 cm/m  RA Area:     14.90 cm LA Vol (A2C): 35.4 ml 14.50 ml/m RA Volume:   39.90 ml  16.34 ml/m LA Vol (A4C): 71.1 ml 29.11 ml/m  AORTIC VALVE LVOT Vmax:   143.00 cm/s LVOT Vmean:  88.800 cm/s LVOT VTI:    0.258 m  AORTA Ao Root diam: 2.80 cm MITRAL VALVE MV Area (PHT): 4.10 cm     SHUNTS MV Decel Time: 185 msec     Systemic VTI: 0.26 m MV E velocity: 122.00 cm/s MV A velocity: 95.10 cm/s MV E/A ratio:  1.28 Tobias Alexander MD Electronically signed by Tobias Alexander MD Signature Date/Time: 07/07/2020/1:14:43 PM    Final     Microbiology: Recent Results (from the past 240 hour(s))  Culture, blood (single)     Status: Abnormal   Collection Time: 07/05/20  6:11 PM   Specimen: BLOOD  Result Value Ref Range Status   Specimen Description   Final    BLOOD BLOOD RIGHT WRIST Performed at [facility], [address], [place], [place] [address]    Special Requests   Final    BOTTLES DRAWN AEROBIC AND ANAEROBIC Blood Culture results may not be optimal due to an inadequate volume of blood received in culture bottles Performed at [facility], [address], [place], [place] [address]    Culture  Setup Time   Final    GRAM POSITIVE COCCI ANAEROBIC AND AEROBIC BOTTLES  Gram Stain Report Called to,Read Back By and Verified With: LOOMIS,K@1150  BY MATTHEWS,B12.10.21 CRITICAL VALUE NOTED.  VALUE IS CONSISTENT WITH PREVIOUSLY REPORTED AND CALLED VALUE.    Culture (A)  Final    STAPHYLOCOCCUS AUREUS SUSCEPTIBILITIES PERFORMED ON PREVIOUS CULTURE WITHIN THE LAST 5 DAYS. Performed at [facility], [address] [place], [place] [address]    Report Status 07/08/2020 FINAL  Final  Blood culture (routine single)     Status: Abnormal   Collection Time: 07/05/20  6:13 PM   Specimen: BLOOD RIGHT  WRIST  Result Value Ref Range Status   Specimen Description   Final    BLOOD RIGHT WRIST Performed at [facility], [address], [place], [place] [address]    Special Requests   Final    BOTTLES DRAWN AEROBIC AND ANAEROBIC Blood Culture results may not be optimal due to an inadequate volume of blood received in culture bottles Performed at [facility], [address] [place], [place] [address]    Culture  Setup Time   Final    GRAM POSITIVE COCCI BOTH ANAEROBIC AND AEROBIC BOTTLES Gram Stain Report Called to,Read Back By and Verified With: PARNELL,R @0945  BY MATTHEWS B 12.10.21 CRITICAL RESULT CALLED TO, READ BACK BY AND VERIFIED WITH: PHARMD L POOLE [id number] 14.10.21 FCP Performed at [facility], [address], [place], [place]    Culture METHICILLIN RESISTANT STAPHYLOCOCCUS AUREUS (A)  Final   Report Status 07/08/2020 FINAL  Final   Organism ID, Bacteria METHICILLIN RESISTANT STAPHYLOCOCCUS AUREUS  Final      Susceptibility   Methicillin resistant staphylococcus aureus - MIC*  CIPROFLOXACIN >=8 RESISTANT Resistant     ERYTHROMYCIN >=8 RESISTANT Resistant     GENTAMICIN <=0.5 SENSITIVE Sensitive     OXACILLIN >=4 RESISTANT Resistant     TETRACYCLINE <=1 SENSITIVE Sensitive     VANCOMYCIN <=0.5 SENSITIVE Sensitive     TRIMETH/SULFA <=10 SENSITIVE Sensitive     CLINDAMYCIN <=0.25 SENSITIVE Sensitive     RIFAMPIN <=0.5 SENSITIVE Sensitive     Inducible Clindamycin NEGATIVE Sensitive     * METHICILLIN RESISTANT STAPHYLOCOCCUS AUREUS  Blood Culture ID Panel (Reflexed)     Status: Abnormal   Collection Time: 07/05/20  6:13 PM  Result Value Ref Range Status   Enterococcus faecalis NOT DETECTED NOT DETECTED Final   Enterococcus Faecium NOT DETECTED NOT DETECTED Final   Listeria monocytogenes NOT DETECTED NOT DETECTED Final   Staphylococcus species DETECTED (A) NOT DETECTED Final    Comment: CRITICAL RESULT CALLED TO, READ BACK BY AND VERIFIED WITH: PHARMD L POOLE  [phone] FCP    Staphylococcus aureus (BCID) DETECTED (A) NOT DETECTED Final    Comment: Methicillin (oxacillin)-resistant Staphylococcus aureus (MRSA). MRSA is predictably resistant to beta-lactam antibiotics (except ceftaroline). Preferred therapy is vancomycin unless clinically contraindicated. Patient requires contact precautions if  hospitalized. CRITICAL RESULT CALLED TO, READ BACK BY AND VERIFIED WITH: PHARMD L POOLE [id number] FCP    Staphylococcus epidermidis NOT DETECTED NOT DETECTED Final   Staphylococcus lugdunensis NOT DETECTED NOT DETECTED Final   Streptococcus species NOT DETECTED NOT DETECTED Final   Streptococcus agalactiae NOT DETECTED NOT DETECTED Final   Streptococcus pneumoniae NOT DETECTED NOT DETECTED Final   Streptococcus pyogenes NOT DETECTED NOT DETECTED Final   A.calcoaceticus-baumannii NOT DETECTED NOT DETECTED Final   Bacteroides fragilis NOT DETECTED NOT DETECTED Final   Enterobacterales NOT DETECTED NOT DETECTED Final   Enterobacter cloacae complex NOT DETECTED NOT DETECTED Final   Escherichia coli NOT DETECTED NOT DETECTED Final   Klebsiella aerogenes NOT DETECTED NOT DETECTED Final   Klebsiella oxytoca NOT DETECTED NOT DETECTED Final   Klebsiella pneumoniae NOT DETECTED NOT DETECTED Final   Proteus species NOT DETECTED NOT DETECTED Final   Salmonella species NOT DETECTED NOT DETECTED Final   Serratia marcescens NOT DETECTED NOT DETECTED Final   Haemophilus influenzae NOT DETECTED NOT DETECTED Final   Neisseria meningitidis NOT DETECTED NOT DETECTED Final   Pseudomonas aeruginosa NOT DETECTED NOT DETECTED Final   Stenotrophomonas maltophilia NOT DETECTED NOT DETECTED Final   Candida albicans NOT DETECTED NOT DETECTED Final   Candida auris NOT DETECTED NOT DETECTED Final   Candida glabrata NOT DETECTED NOT DETECTED Final   Candida krusei NOT DETECTED NOT DETECTED Final   Candida parapsilosis NOT DETECTED NOT DETECTED Final   Candida tropicalis NOT  DETECTED NOT DETECTED Final   Cryptococcus neoformans/gattii NOT DETECTED NOT DETECTED Final   Meth resistant mecA/C and MREJ DETECTED (A) NOT DETECTED Final    Comment: CRITICAL RESULT CALLED TO, READ BACK BY AND VERIFIED WITH: PHARMD L POOLE [phone] FCP Performed at [facility], [address] [place], [place] [address]   Resp Panel by RT-PCR (Flu A&B, Covid) Nasopharyngeal Swab     Status: None   Collection Time: 07/05/20  9:49 PM   Specimen: Nasopharyngeal Swab; Nasopharyngeal(NP) swabs in vial transport medium  Result Value Ref Range Status   SARS Coronavirus 2 by RT PCR NEGATIVE NEGATIVE Final    Comment: (NOTE) SARS-CoV-2 target nucleic acids are NOT DETECTED.  The SARS-CoV-2 RNA is generally detectable in upper respiratory  specimens during the acute phase of infection. The lowest concentration of SARS-CoV-2 viral copies this assay can detect is 138 copies/mL. A negative result does not preclude SARS-Cov-2 infection and should not be used as the sole basis for treatment or other patient management decisions. A negative result may occur with  improper specimen collection/handling, submission of specimen other than nasopharyngeal swab, presence of viral mutation(s) within the areas targeted by this assay, and inadequate number of viral copies(<138 copies/mL). A negative result must be combined with clinical observations, patient history, and epidemiological information. The expected result is Negative.  Fact Sheet for Patients:  EntrepreneurPulse.com.au  Fact Sheet for Healthcare Providers:  IncredibleEmployment.be  This test is no t yet approved or cleared by the [place] FDA and  has been authorized for detection and/or diagnosis of SARS-CoV-2 by FDA under an Emergency Use Authorization (EUA). This EUA will remain  in effect (meaning this test can be used) for the duration of the COVID-19 declaration under Section  564(b)(1) of the Act, 21 U.S.C.section 360bbb-3(b)(1), unless the authorization is terminated  or revoked sooner.       Influenza A by PCR NEGATIVE NEGATIVE Final   Influenza B by PCR NEGATIVE NEGATIVE Final    Comment: (NOTE) The Xpert Xpress SARS-CoV-2/FLU/RSV plus assay is intended as an aid in the diagnosis of influenza from Nasopharyngeal swab specimens and should not be used as a sole basis for treatment. Nasal washings and aspirates are unacceptable for Xpert Xpress SARS-CoV-2/FLU/RSV testing.  Fact Sheet for Patients: EntrepreneurPulse.com.au  Fact Sheet for Healthcare Providers: IncredibleEmployment.be  This test is not yet approved or cleared by the [place] FDA and has been authorized for detection and/or diagnosis of SARS-CoV-2 by FDA under an Emergency Use Authorization (EUA). This EUA will remain in effect (meaning this test can be used) for the duration of the COVID-19 declaration under Section 564(b)(1) of the Act, 21 U.S.C. section 360bbb-3(b)(1), unless the authorization is terminated or revoked.  Performed at [facility], [address], [place], [place] [address]   MRSA PCR Screening     Status: Abnormal   Collection Time: 07/06/20 11:31 AM   Specimen: Nasal Mucosa; Nasopharyngeal  Result Value Ref Range Status   MRSA by PCR POSITIVE (A) NEGATIVE Final    Comment:        The GeneXpert MRSA Assay (FDA approved for NASAL specimens only), is one component of a comprehensive MRSA colonization surveillance program. It is not intended to diagnose MRSA infection nor to guide or monitor treatment for MRSA infections. RESULT CALLED TO, READ BACK BY AND VERIFIED WITH: LOOMIS K. @ [phone] ON [id number] BY HENDERSON L. Performed at [facility], [address] [place], [place] [address]   Culture, blood (routine x 2)     Status: None   Collection Time: 07/07/20  8:59 AM   Specimen: BLOOD RIGHT HAND  Result Value Ref Range  Status   Specimen Description   Final    BLOOD RIGHT HAND BOTTLES DRAWN AEROBIC AND ANAEROBIC   Special Requests Blood Culture adequate volume  Final   Culture   Final    NO GROWTH 5 DAYS Performed at [facility], [address], [place],  [address]    Report Status 07/12/2020 FINAL  Final     Labs: Basic Metabolic Panel: Recent Labs  Lab 07/06/20 0500 07/07/20 0334 07/08/20 0356 07/09/20 0451 07/11/20 1855 07/12/20 0646  NA 134* 132* 132* 132* 130* 131*  K 4.6 3.8 4.3 4.9 5.8* 4.1  CL 92* 91* 91* 89* 90* 91*  CO2 _0 GLUCOSE 130* 167* 119* 121* 246* 177*  BUN 68* 35* 55* 72* 105* 55*  CREATININE 13.55* 7.46* 10.15* 11.80* 10.20* 6.05*  CALCIUM 8.8* 8.5* 8.5* 8.6* 8.7* 8.7*  MG 1.9 1.8  --   --   --   --   PHOS 6.5*  --   --   --  9.1*  --    Liver Function Tests: Recent Labs  Lab 07/05/20 1813 07/06/20 0500 07/11/20 1855 07/12/20 0646  AST 29 16  --  10*  ALT 38 26  --  14  ALKPHOS 146* 130*  --  98  BILITOT 1.6* 0.8  --  1.2  PROT 8.1 7.7  --  7.2  ALBUMIN 3.3* 3.0* 2.6* 2.7*   Recent Labs  Lab 07/05/20 1813  LIPASE 23   No results for input(s): AMMONIA in the last 168 hours. CBC: Recent Labs  Lab 07/05/20 1813 07/06/20 0500 07/07/20 0334 07/08/20 0356 07/09/20 0451 07/10/20 0413 07/11/20 1855  WBC 11.9*   < > 10.8* 10.5 11.9* 10.5 8.7  NEUTROABS 9.7*  --  8.7* 8.5* 9.9* 9.2*  --   HGB 11.1*   < > 9.7* 9.8* 9.6* 10.3* 9.9*  HCT 36.4   < > 31.5* 32.8* 31.4* 33.9* 32.2*  MCV 96.8   < > 96.3 98.2 96.3 95.8 95.3  PLT 225   < > 202 199 213 206 245   < > = values in this interval not displayed.   Cardiac Enzymes: No results for input(s): CKTOTAL, CKMB, CKMBINDEX, TROPONINI in the last 168 hours. BNP: BNP (last 3 results) No results for input(s): BNP in the last 8760 hours.  ProBNP (last 3 results) No results for input(s): PROBNP in the last 8760 hours.  CBG: Recent Labs  Lab 07/11/20 0807 07/11/20 1127 07/11/20 1624  07/11/20 2111 07/12/20 0824  GLUCAP 197* 183* 215* 175* 165*       Signed:  Nita Sells MD   [facility] 07/12/2020, 11:38 AM

## 2020-07-12 NOTE — Progress Notes (Signed)
Patient ID: Martha [name], female   DOB: Dec 08, 1965, 54 y.o.   MRN: [id number] S: no complaints/acute events, tolerated HD yesterday, terminated 15 min early due to asymptomatic hypotension. Net uf 1.5L O:BP (!) 154/73 (BP Location: Left Arm)   Pulse (!) 59   Temp 97.6 F (36.4 C) (Oral)   Resp 19   Ht 5\' 7"  (1.702 m)   Wt (!) 139.8 kg   LMP 08/03/2008 (Exact Date)   SpO2 99%   BMI 48.27 kg/m   Intake/Output Summary (Last 24 hours) at 07/12/2020 0921 Last data filed at 07/11/2020 2250 Gross per 24 hour  Intake 240 ml  Output 1500 ml  Net -1260 ml   Intake/Output: I/O last 3 completed shifts: In: 240 [P.O.:240] Out: 1500 [Other:1500]  Intake/Output this shift:  No intake/output data recorded. Weight change:  Gen: NAD CVS: RRR Resp: cta Abd: +BS, soft, NT/ND Ext: no edema, LUE AVF +T/B, left wrist less tender and no swelling  Recent Labs  Lab 07/05/20 1813 07/06/20 0500 07/07/20 0334 07/08/20 0356 07/09/20 0451 07/11/20 1855 07/12/20 0646  NA 132* 134* 132* 132* 132* 130* 131*  K 4.6 4.6 3.8 4.3 4.9 5.8* 4.1  CL 91* 92* 91* 91* 89* 90* 91*  CO2 22 23 28 26 25 23 25   GLUCOSE 158* 130* 167* 119* 121* 246* 177*  BUN 57* 68* 35* 55* 72* 105* 55*  CREATININE 12.35* 13.55* 7.46* 10.15* 11.80* 10.20* 6.05*  ALBUMIN 3.3* 3.0*  --   --   --  2.6* 2.7*  CALCIUM 8.3* 8.8* 8.5* 8.5* 8.6* 8.7* 8.7*  PHOS  --  6.5*  --   --   --  9.1*  --   AST 29 16  --   --   --   --  10*  ALT 38 26  --   --   --   --  14   Liver Function Tests: Recent Labs  Lab 07/05/20 1813 07/06/20 0500 07/11/20 1855 07/12/20 0646  AST 29 16  --  10*  ALT 38 26  --  14  ALKPHOS 146* 130*  --  98  BILITOT 1.6* 0.8  --  1.2  PROT 8.1 7.7  --  7.2  ALBUMIN 3.3* 3.0* 2.6* 2.7*   Recent Labs  Lab 07/05/20 1813  LIPASE 23   No results for input(s): AMMONIA in the last 168 hours. CBC: Recent Labs  Lab 07/07/20 0334 07/08/20 0356 07/09/20 0451 07/10/20 0413 07/11/20 1855  WBC 10.8* 10.5  11.9* 10.5 8.7  NEUTROABS 8.7* 8.5* 9.9* 9.2*  --   HGB 9.7* 9.8* 9.6* 10.3* 9.9*  HCT 31.5* 32.8* 31.4* 33.9* 32.2*  MCV 96.3 98.2 96.3 95.8 95.3  PLT 202 199 213 206 245   Cardiac Enzymes: No results for input(s): CKTOTAL, CKMB, CKMBINDEX, TROPONINI in the last 168 hours. CBG: Recent Labs  Lab 07/11/20 0807 07/11/20 1127 07/11/20 1624 07/11/20 2111 07/12/20 0824  GLUCAP 197* 183* 215* 175* 165*    Iron Studies:  Recent Labs    07/09/20 1302  IRON 36  TIBC 159*  FERRITIN 2,821*   Studies/Results: No results found. Marland Kitchen atorvastatin  20 mg Oral Daily  . calcium carbonate  400 mg of elemental calcium Oral TID with meals  . cinacalcet  30 mg Oral Daily  . colchicine  0.6 mg Oral QODAY  . darbepoetin (ARANESP) injection - DIALYSIS  25 mcg Intravenous Q Wed-HD  . dextromethorphan-guaiFENesin  1 tablet Oral BID  . dorzolamide-timolol  1 drop Both Eyes BID  . [START ON 07/13/2020] doxercalciferol  1 mcg Intravenous Q M,W,F-HD  . famotidine  20 mg Oral Daily  . feeding supplement (GLUCERNA SHAKE)  237 mL Oral TID BM  . gabapentin  100 mg Oral QHS  . heparin  5,000 Units Subcutaneous Q8H  . insulin aspart  0-5 Units Subcutaneous QHS  . insulin aspart  0-6 Units Subcutaneous TID WC  . mouth rinse  15 mL Mouth Rinse BID  . pantoprazole  40 mg Oral Daily  . PARoxetine  20 mg Oral QPM  . predniSONE  40 mg Oral Q breakfast  . sevelamer carbonate  800 mg Oral TID WC    BMET    Component Value Date/Time   NA 131 (L) 07/12/2020 0646   K 4.1 07/12/2020 0646   CL 91 (L) 07/12/2020 0646   CO2 25 07/12/2020 0646   GLUCOSE 177 (H) 07/12/2020 0646   BUN 55 (H) 07/12/2020 0646   CREATININE 6.05 (H) 07/12/2020 0646   CALCIUM 8.7 (L) 07/12/2020 0646   CALCIUM 9.4 04/10/2017 0425   GFRNONAA 8 (L) 07/12/2020 0646   GFRAA 8 (L) 03/06/2020 0215   CBC    Component Value Date/Time   WBC 8.7 07/11/2020 1855   RBC 3.38 (L) 07/11/2020 1855   HGB 9.9 (L) 07/11/2020 1855   HGB 11.5  03/16/2013 0000   HCT 32.2 (L) 07/11/2020 1855   HCT 29.3 (L) 04/06/2017 0357   HCT 36 03/16/2013 0000   PLT 245 07/11/2020 1855   MCV 95.3 07/11/2020 1855   MCV 87.8 03/16/2013 0000   MCH 29.3 07/11/2020 1855   MCHC 30.7 07/11/2020 1855   RDW 16.3 (H) 07/11/2020 1855   LYMPHSABS 0.8 07/10/2020 0413   MONOABS 0.3 07/10/2020 0413   EOSABS 0.0 07/10/2020 0413   BASOSABS 0.0 07/10/2020 0413     Assessment/Plan:  1. MRSA bacteremia presumably due to pneumonia- on vancomycin for 6 weeks per ID pending TEE. 2. ESRD- continue with HD on MWF schedule.  3. Pain of left hand/wrist- appears more musculoskeletal and no evidence of steal syndrome on exam.  She reports marked improvement of her pain and mobility after starting prednisone taper-  Agree with short taper of prednisone for possible gout.  CT scan negative for osteo or septic joint. 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 5. HTN/volume- UF as tolerated. 6. SHPTH- continue with binders and follow. 7. DM type 2- per primary 8. OSA on CPAP  Gean Quint, MD [facility]

## 2020-07-23 ENCOUNTER — Emergency Department (HOSPITAL_COMMUNITY)
Admission: EM | Admit: 2020-07-23 | Discharge: 2020-07-23 | Disposition: A | Discharge status: Home / Self Care | Payer: Medicaid Other | Attending: Emergency Medicine | Admitting: Emergency Medicine

## 2020-07-23 ENCOUNTER — Emergency Department (HOSPITAL_COMMUNITY): Payer: Medicaid Other

## 2020-07-23 ENCOUNTER — Encounter (HOSPITAL_COMMUNITY): Payer: Self-pay | Admitting: Emergency Medicine

## 2020-07-23 ENCOUNTER — Other Ambulatory Visit: Payer: Self-pay

## 2020-07-23 DIAGNOSIS — J449 Chronic obstructive pulmonary disease, unspecified: Secondary | ICD-10-CM | POA: Diagnosis not present

## 2020-07-23 DIAGNOSIS — Z992 Dependence on renal dialysis: Secondary | ICD-10-CM | POA: Diagnosis not present

## 2020-07-23 DIAGNOSIS — Y92009 Unspecified place in unspecified non-institutional (private) residence as the place of occurrence of the external cause: Secondary | ICD-10-CM | POA: Diagnosis not present

## 2020-07-23 DIAGNOSIS — E1122 Type 2 diabetes mellitus with diabetic chronic kidney disease: Secondary | ICD-10-CM | POA: Diagnosis not present

## 2020-07-23 DIAGNOSIS — I132 Hypertensive heart and chronic kidney disease with heart failure and with stage 5 chronic kidney disease, or end stage renal disease: Secondary | ICD-10-CM | POA: Diagnosis not present

## 2020-07-23 DIAGNOSIS — I509 Heart failure, unspecified: Secondary | ICD-10-CM | POA: Diagnosis not present

## 2020-07-23 DIAGNOSIS — Z794 Long term (current) use of insulin: Secondary | ICD-10-CM | POA: Insufficient documentation

## 2020-07-23 DIAGNOSIS — W1839XA Other fall on same level, initial encounter: Secondary | ICD-10-CM | POA: Insufficient documentation

## 2020-07-23 DIAGNOSIS — Y9301 Activity, walking, marching and hiking: Secondary | ICD-10-CM | POA: Diagnosis not present

## 2020-07-23 DIAGNOSIS — W19XXXA Unspecified fall, initial encounter: Secondary | ICD-10-CM

## 2020-07-23 DIAGNOSIS — N186 End stage renal disease: Secondary | ICD-10-CM | POA: Diagnosis not present

## 2020-07-23 DIAGNOSIS — M545 Low back pain, unspecified: Secondary | ICD-10-CM | POA: Diagnosis present

## 2020-07-23 DIAGNOSIS — Z87891 Personal history of nicotine dependence: Secondary | ICD-10-CM | POA: Insufficient documentation

## 2020-07-23 MED ORDER — LIDOCAINE 5 % EX PTCH
1.0000 | MEDICATED_PATCH | Freq: Once | CUTANEOUS | Status: DC
  Administered 2020-07-23: 1 via TRANSDERMAL
  Filled 2020-07-23: qty 1

## 2020-07-23 MED ORDER — FENTANYL CITRATE (PF) 100 MCG/2ML IJ SOLN
50.0000 ug | Freq: Once | INTRAMUSCULAR | Status: AC
  Administered 2020-07-23: 50 ug via INTRAMUSCULAR
  Filled 2020-07-23: qty 2

## 2020-07-23 MED ORDER — ACETAMINOPHEN 325 MG PO TABS
650.0000 mg | ORAL_TABLET | Freq: Once | ORAL | Status: AC
  Administered 2020-07-23: 650 mg via ORAL
  Filled 2020-07-23: qty 2

## 2020-07-23 NOTE — Discharge Instructions (Signed)
The x-ray did not show any broken bones in your back.  Continue home medications  Follow-up with primary care doctor

## 2020-07-23 NOTE — ED Notes (Signed)
PA at bedside.

## 2020-07-23 NOTE — ED Provider Notes (Signed)
[facility] EMERGENCY DEPARTMENT Provider Note   CSN: [id number] Arrival date & time: 07/23/20  1417     History Chief Complaint  Patient presents with  . Fall    Martha [name] is a 54 y.o. female with past medical history significant for anemia, arthritis, bipolar 1 disorder, CHF, CKD stage V, type 2 diabetes COPD, diabetic retinopathy, hypertension, obesity, degenerative disc disease.  HPI Patient presents to emergency department today via EMS with chief complaint of fall.  Happened just prior to arrival.  Patient states she was walking through the laundry room to get to her bedroom when she tripped over a pile of rugs.  She was walking with her walker. She states she landed on her bottom. She denies hitting her head or loss of consciousness. She admits to being incontinent of stool while on the floor because she was unable to get up to walk to the bathroom. She was on the ground for approximately 10 minutes. Her ex-husband called EMS. Fire also came to assist patient to stand. Patient initially denied being in any pain on their arrival and thought she could stand with assistance. In route to hospital she said her low back was hurting. Pain radiates across low back and is constant. She rates pain 3/10 in severity. No medications for symptoms prior to arrival. She denies any prodrome of symptoms. She did not have any chest pain, palpitations, weakness, dizziness or lightheadedness. She went to dialysis today and had complete session. She reports feeling like her usual self afterwards. Denies any blood in stool or black tarry stool. She denies any numbness/weakness of upper and lower extremities, bladder incontinence, urinary retention although only makes minimal urine, history of cancer, saddle anesthesia, history of back surgery, history of IVDA.   Patient had recent admission for CAP and MRSA bacteremia. Scheduled to have next dose of IV vancomycin next month 08/17/2020. She admits to doing  well at home since discharge x 11 days ago.  Past Medical History:  Diagnosis Date  . Acid reflux    takes Tums  . Anemia   . Arthritis   . Bipolar 1 disorder ([place])   . Blindness of left eye    left eye removed  . Carpal tunnel syndrome, bilateral   . Cervical radiculopathy   . CHF (congestive heart failure) ([place])   . Chronic kidney disease    Stage 5- 01/25/17  . Constipation   . COPD (chronic obstructive pulmonary disease) ([place]) 2014   bronchitis  . Degenerative disc disease, thoracic   . Depression   . Diabetes mellitus    Type II  . Diabetic retinopathy ([place])   . Dyspnea    when walking  . End stage kidney disease ([place])    M, W, F [facility]  . Gout   . Hypertension   . Noncompliance with medication regimen   . Noncompliance with medication regimen   . Obesity (BMI 30-39.9)   . OSA (obstructive sleep apnea)    cpap  . Panic attack   . RLS (restless legs syndrome)     Patient Active Problem List   Diagnosis Date Noted  . Hypoalbuminemia 07/06/2020  . Acute respiratory failure with hypoxia ([place]) 07/06/2020  . CAP (community acquired pneumonia) 07/05/2020  . Trimalleolar fracture of ankle, closed, right, initial encounter 12/17/2018  . Pain and swelling of ankle, right 12/16/2018  . Metabolic encephalopathy 54/27/0623  . SIRS (systemic inflammatory response syndrome) ([place]) 06/23/2018  . UTI (urinary tract infection) 02/26/2018  .  Volume overload 02/25/2018  . 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 Dialysis patient ([place]) 12/13/2017  . ESRD (end stage renal disease) on dialysis ([place])   . Chronic diarrhea 10/08/2017  . Anemia in chronic kidney disease 05/24/2017  . Chronic obstructive pulmonary disease ([place]) 05/24/2017  . Heart failure ([place]) 05/24/2017  . 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 Renal failure (ARF), acute on chronic (HCC) 04/22/2017  . Anasarca associated with disorder of kidney 04/20/2017   . Protein-calorie malnutrition, severe 11/29/2016  . HTN (hypertension), malignant 11/20/2016  . Bipolar 1 disorder ([place]) 11/20/2016  . Noncompliance with medication regimen 11/20/2016  . Panic attack 11/20/2016  . Uncontrolled type 2 diabetes mellitus with hyperglycemia, without long-term current use of insulin ([place]) 10/27/2016  . Sensory disturbance 10/27/2016  . Left hemiparesis ([place]) 10/27/2016  . Cholelithiasis 04/26/2014  . Hyponatremia 04/26/2014  . Gallstones 04/06/2014  . Diabetic ulcer of left great toe ([place]) 10/09/2013  . Type 2 diabetes mellitus treated with insulin ([place]) 05/30/2013  . Hyperglycemia due to diabetes mellitus ([place]) 05/30/2013  . Internal hemorrhoids with other complication 94/85/4627  . Umbilical hernia without mention of obstruction or gangrene 04/15/2013  . Anemia 04/15/2013  . Morbid obesity with BMI of 50.0-59.9, adult ([place]) 10/08/2012  . DM 05/13/2010  . Hyperlipidemia 05/13/2010  . Morbid obesity ([place]) 05/13/2010  . Depression with anxiety 05/13/2010  . RESTLESS LEG SYNDROME 05/13/2010  . Essential hypertension 05/13/2010  . GERD 05/13/2010  . RECTAL BLEEDING 05/13/2010  . Sleep apnea 05/13/2010  . Dysphagia, oropharyngeal phase 05/13/2010    Past Surgical History:  Procedure Laterality Date  . AV FISTULA PLACEMENT Right 01/27/2017   Procedure: ARTERIOVENOUS (AV) FISTULA CREATION-RIGHT ARM;  Surgeon: Elam Dutch, MD;  Location: [facility] OR;  Service: Vascular;  Laterality: Right;  . AV FISTULA PLACEMENT Left 08/03/2018   Procedure: LEFT ARTERIOVENOUS (AV) FISTULA CREATION;  Surgeon: Elam Dutch, MD;  Location: [place];  Service: Vascular;  Laterality: Left;  . BASCILIC VEIN TRANSPOSITION Left 04/26/2019   Procedure: SECOND STAGE BASILIC VEIN TRANSPOSITION LEFT ARM;  Surgeon: Elam Dutch, MD;  Location: [place];  Service: Vascular;  Laterality: Left;  . BIOPSY N/A 05/17/2013   Procedure: BIOPSY;  Surgeon: [name], MD;  Location: [facility];  Service: Endoscopy;  Laterality: N/A;  . CARPAL TUNNEL RELEASE Left 11/25/2018   Procedure: LEFT CARPAL TUNNEL RELEASE;  Surgeon: Charlotte Crumb, MD;  Location: [place];  Service: Orthopedics;  Laterality: Left;  . CATARACT EXTRACTION Right   . CHOLECYSTECTOMY N/A 04/27/2014   Procedure: LAPAROSCOPIC CHOLECYSTECTOMY;  Surgeon: [name], MD;  Location: [facility] ORS;  Service: General;  Laterality: N/A;  . COLONOSCOPY  06/06/2010   OJJ:[id number] bleeding secondary to internal hemorrhoids but incomplete evaluation secondary to poor right colon prep/small rectal and sigmoid colon polyps (hyperplastic). PROPOFOL  . COLONOSCOPY  May 2013   Dr. Gilliam/[facility]: 5 mm a descending colon polyp, hyperplastic. Adequate bowel prep.  . ENDOMETRIAL ABLATION    . 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 ENUCLEATION Left 02/04/2018   Procedure: ENUCLEATION WITH PLACEMENT OF IMPLANT LEFT EYE;  Surgeon: Clista Bernhardt, MD;  Location: [place];  Service: Ophthalmology;  Laterality: Left;  . ESOPHAGOGASTRODUODENOSCOPY  06/06/2010   WEX:HBZJIRC erythema and edema of body of stomach, with sessile polypoid lesions. bx benign. no h.pylori  . ESOPHAGOGASTRODUODENOSCOPY (EGD) WITH PROPOFOL N/A 05/17/2013   Dr. Oneida Alar: normal  esophagus, moderate nodular gastritis, negative path, empiric Savary dilation  . EYE SURGERY  07/2017   sx for glaucoma  . FLEXIBLE SIGMOIDOSCOPY N/A 05/17/2013   Dr. Oneida Alar: moderate sized internal hemorrhoids  . HEMORRHOID BANDING N/A 05/17/2013   Procedure: HEMORRHOID BANDING;  Surgeon: [name], MD;  Location: [facility];  Service: Endoscopy;  Laterality: N/A;  2 bands placed  . INCISION AND DRAINAGE ABSCESS Left 10/11/2013   Procedure: INCISION AND DRAINAGE AND DEBRIDEMENT LEFT BREAST  ABSCESS;  Surgeon: [name], MD;  Location: [facility];  Service: General;  Laterality: Left;  . INSERTION OF DIALYSIS CATHETER N/A 06/08/2018   Procedure:  INSERTION OF Right internal jugular TUNNELED  DIALYSIS CATHETER;  Surgeon: Angelia Mould, MD;  Location: [place];  Service: Vascular;  Laterality: N/A;  . IRRIGATION AND DEBRIDEMENT ABSCESS Right 06/01/2013   Procedure: INCISION AND DRAINAGE AND DEBRIDEMENT ABSCESS RIGHT BREAST;  Surgeon: [name], MD;  Location: [facility];  Service: General;  Laterality: Right;  . LEFT EYE REMOVED Left 01/2018   [facility] on [address].  Marland Kitchen LIGATION OF ARTERIOVENOUS  FISTULA Right 06/08/2018   Procedure: LIGATION OF ARTERIOVENOUS  FISTULA RIGHT ARM;  Surgeon: Angelia Mould, MD;  Location: [place];  Service: Vascular;  Laterality: Right;  . ORIF ANKLE FRACTURE Right 12/17/2018   Procedure: OPEN REDUCTION INTERNAL FIXATION (ORIF) ANKLE FRACTURE;  Surgeon: Edrick Kins, DPM;  Location: [place];  Service: Podiatry;  Laterality: Right;  . SAVORY DILATION N/A 05/17/2013   Procedure: SAVORY DILATION;  Surgeon: [name], MD;  Location[facility];  Service: Endoscopy;  Laterality: N/A;  14/15/16  . SKIN FULL THICKNESS GRAFT Left 05/05/2018   Procedure: ABDOMINAL DERMIS FAT SKIN GRAFT FULL THICKNESS LEFT EYE;  Surgeon: Clista Bernhardt, MD;  Location: [place];  Service: Ophthalmology;  Laterality: Left;  . TUBAL LIGATION       OB History    Gravida  2   Para  2   Term  1   Preterm  1   AB      Living  2     SAB      IAB      Ectopic      Multiple      Live Births              Family History  Adopted: Yes  Family history unknown: Yes    Social History   Tobacco Use  . Smoking status: Former Smoker    Packs/day: 1.50    Years: 25.00    Pack years: 37.50    Types: Cigarettes    Quit date: 10/23/2007    Years since quitting: 12.7  . Smokeless tobacco: Never Used  Vaping Use  . Vaping Use: Never used  Substance Use Topics  . Alcohol use: No  . Drug use: No    Home Medications Prior to Admission medications   Medication Sig Start Date End Date  Taking? Authorizing Provider  albuterol (PROVENTIL) (2.5 MG/3ML) 0.083% nebulizer solution Take 2.5 mg by nebulization every 6 (six) hours as needed for wheezing or shortness of breath.    [provider]  ALPRAZolam Duanne Moron) 1 MG tablet Take 1 tablet (1 mg total) by mouth 4 (four) times daily as needed for anxiety. 07/12/20   Nita Sells, MD  bisacodyl (DULCOLAX) 10 MG suppository Place 1 suppository (10 mg total) rectally daily as needed for moderate constipation. 12/03/18   Sinda Du, MD  busPIRone (BUSPAR) 7.5 MG tablet Take 1 tablet (7.5 mg total) by mouth 2 (two) times daily. 07/12/20   Nita Sells, MD  calcium carbonate (TUMS EX) 750 MG chewable tablet Chew 2-4 tablets by mouth See admin instructions. Take 4 tablet with each meal & take 2 tablets with each snack    [provider]  cinacalcet (SENSIPAR) 30 MG tablet Take 30 mg by mouth daily.    [provider]  colchicine 0.6 MG tablet Take 1 tablet (0.6 mg total) by mouth every Tuesday, Thursday, and Saturday at 6 PM. Take 1 tablet every other day Patient taking differently: Take 0.6 mg by mouth every Tuesday, Thursday, and Saturday at 6 PM. 09/30/18   Johnson, Clanford L, MD  cyclobenzaprine (FLEXERIL) 10 MG tablet Take 1 tablet (10 mg total) by mouth 2 (two) times daily as needed for muscle spasms. 08/17/19   Milton Ferguson, MD  dorzolamide-timolol (COSOPT) 22.3-6.8 MG/ML ophthalmic solution Place 1 drop into the right eye in the morning and at bedtime. 06/01/20   [provider]  doxercalciferol (HECTOROL) 4 MCG/2ML injection Inject 0.5 mLs (1 mcg total) into the vein every Monday, Wednesday, and Friday with hemodialysis. 12/03/18   Sinda Du, MD  famotidine (PEPCID) 20 MG tablet Take 1 tablet (20 mg total) by mouth daily. 07/13/20   Nita Sells, MD  gabapentin (NEURONTIN) 100 MG capsule Take 1 capsule (100 mg total) by mouth at bedtime. 07/12/20   Nita Sells, MD   insulin aspart (NOVOLOG) 100 UNIT/ML injection Inject 0-9 Units into the skin 3 (three) times daily with meals. Patient taking differently: Inject 2-15 Units into the skin 3 (three) times daily with meals. Sliding scale insulin 12/02/18   Sinda Du, MD  Insulin Glargine [facility]) 100 UNIT/ML Inject 30 Units into the skin at bedtime. 07/12/20   Nita Sells, MD  lidocaine-prilocaine (EMLA) cream Apply 1 application topically as needed (topical anesthesia for hemodialysis if Gebauers and Lidocaine injection are ineffective.). Patient taking differently: Apply 1 application topically daily as needed (dialysis.). 04/25/17   Johnson, Clanford L, MD  omeprazole (PRILOSEC) 40 MG capsule Take 40 mg by mouth daily.    [provider]  PARoxetine (PAXIL) 20 MG tablet Take 1 tablet (20 mg total) by mouth every evening. This is to prevent panic attacks 07/12/20   Nita Sells, MD  predniSONE (DELTASONE) 20 MG tablet Take 2 tablets (40 mg total) by mouth daily with breakfast. 07/13/20   Nita Sells, MD  sevelamer carbonate (RENVELA) 800 MG tablet Take 1 tablet (800 mg total) by mouth 3 (three) times daily with meals. 07/12/20   Nita Sells, MD  vancomycin IVPB Inject 1,000 mg into the vein every Monday, Wednesday, and Friday with hemodialysis. Indication:  MRSA Bacteremia First Dose: No Last Day of Therapy:  08/17/2020 Labs - Sunday/Monday:  CBC/D, BMP, and vancomycin trough. Labs - Thursday:  BMP and vancomycin trough Labs - Every other week:  ESR and CRP Method of administration:Elastomeric Method of administration may be changed at the discretion of the patient and/or caregiver's ability to self-administer the medication ordered. 07/10/20 08/18/20  Barb Merino, MD    Allergies    Codeine and Tape  Review of Systems   Review of Systems All other systems are reviewed and are negative for acute change except as noted in the HPI.  Physical  Exam Updated Vital Signs BP (!) 150/67   Pulse 66   Temp 97.6 F (36.4 C) (Oral)   Resp 18  Ht 5\' 7"  (1.702 m)   Wt 136.1 kg   LMP 08/03/2008 (Exact Date)   SpO2 99%   BMI 46.99 kg/m   Physical Exam Vitals and nursing note reviewed.  Constitutional:      General: She is not in acute distress.    Appearance: She is obese. She is not ill-appearing.  HENT:     Head: Normocephalic and atraumatic.     Comments: No tenderness to palpation of skull. No deformities or crepitus noted. No open wounds, abrasions or lacerations.    Right Ear: Tympanic membrane and external ear normal.     Left Ear: Tympanic membrane and external ear normal.     Nose: Nose normal.     Mouth/Throat:     Mouth: Mucous membranes are moist.     Pharynx: Oropharynx is clear.  Eyes:     General: No scleral icterus.       Right eye: No discharge.        Left eye: No discharge.     Extraocular Movements: Extraocular movements intact.     Conjunctiva/sclera: Conjunctivae normal.     Pupils: Pupils are equal, round, and reactive to light.  Neck:     Vascular: No JVD.  Cardiovascular:     Rate and Rhythm: Normal rate and regular rhythm.     Pulses: Normal pulses.          Radial pulses are 2+ on the right side and 2+ on the left side.     Heart sounds: Normal heart sounds.  Pulmonary:     Comments: Lungs clear to auscultation in all fields. Symmetric chest rise. No wheezing, rales, or rhonchi. Abdominal:     Comments: Abdomen is soft, non-distended, and non-tender in all quadrants. No rigidity, no guarding. No peritoneal signs.  Musculoskeletal:        General: Normal range of motion.     Cervical back: Normal range of motion. No tenderness.       Back:     Comments: Tenderness to palpation as depicted in image above. No midline tenderness. No step off or deformity. No overlying sign changes.  AV fistula left upper extremity with palpable thrill.  Skin:    General: Skin is warm and dry.     Capillary  Refill: Capillary refill takes less than 2 seconds.  Neurological:     Mental Status: She is oriented to person, place, and time.     GCS: GCS eye subscore is 4. GCS verbal subscore is 5. GCS motor subscore is 6.     Comments: Fluent speech, no facial droop.  Sensation grossly intact to light touch in the lower extremities bilaterally. No saddle anesthesias. Strength 5/5 with flexion and extension at the bilateral hips, knees, and ankles. Coordination intact with heel to shin testing.   Psychiatric:        Behavior: Behavior normal.     ED Results / Procedures / Treatments   Labs (all labs ordered are listed, but only abnormal results are displayed) Labs Reviewed - No data to display  EKG EKG Interpretation  Date/Time:  Monday July 23 2020 14:50:35 EST Ventricular Rate:  66 PR Interval:    QRS Duration: 93 QT Interval:  423 QTC Calculation: 444 R Axis:   36 Text Interpretation: AV block, complete (third degree) Posterior infarct, old Confirmed by [id number] [id number]) on 07/23/2020 3:01:22 PM   EKG Interpretation  Date/Time:  Monday July 23 2020 15:04:53 EST Ventricular Rate:  64 PR  Interval:    QRS Duration: 93 QT Interval:  435 QTC Calculation: 449 R Axis:   43 Text Interpretation: Sinus rhythm Interpretation limited secondary to artifact Short PR interval Posterior infarct, old Minimal ST depression, inferior leads Confirmed by Fredia Sorrow [phone]) on 07/23/2020 3:21:56 PM        Radiology DG Lumbar Spine Complete  Result Date: 07/23/2020 CLINICAL DATA:  Post fall with back pain. EXAM: LUMBAR SPINE - COMPLETE 4+ VIEW COMPARISON:  Reformats from abdominopelvic CT 07/05/2020 FINDINGS: Normal alignment. No evidence of acute fracture. Vertebral body heights are preserved. Slight elongation of anterior L2 vertebra is chronic and unchanged from prior exam. Multilevel endplate spurring with mild diffuse disc space narrowing. There is lower lumbar facet  hypertrophy. The sacroiliac joints are congruent. There are vascular calcifications, advanced for age. IMPRESSION: 1. No acute fracture or subluxation of the lumbar spine. 2. Mild diffuse degenerative change. Electronically Signed   By: Keith Rake M.D.   On: 07/23/2020 15:41    Procedures Procedures (including critical care time)  Medications Ordered in ED Medications  lidocaine (LIDODERM) 5 % 1 patch (1 patch Transdermal Patch Applied 07/23/20 1514)  fentaNYL (SUBLIMAZE) injection 50 mcg (50 mcg Intramuscular Given 07/23/20 1513)  acetaminophen (TYLENOL) tablet 650 mg (650 mg Oral Given 07/23/20 1513)    ED Course  I have reviewed the triage vital signs and the nursing notes.  Pertinent labs & imaging results that were available during my care of the patient were reviewed by me and considered in my medical decision making (see chart for details).    MDM Rules/Calculators/A&P                          History provided by patient and EMS with additional history obtained from chart review.    54 yo female presenting after mechanical fall with bilateral lower back pain. Exam unremarkable.No red flags for back pain. No signs of head injury and denied hitting head.  EKG on arrival shows AV block with artifact. QRS normal, not wide and a HR in the 60s does not correlate with heart block. Repeat EKG shows sinus rhythm. Patient is adamant she tripped over rugs in laundry room. She had no prodrome of symptoms. Presentation does not suggest syncope. She had bowel movement with light brown stool on her pants on arrival, GI bleed unlikely cause of fall either.  Given IM pain medicine and Lidoderm patch. Xray of lumbar spine viewed by me without acute traumatic findings. Agree with radiologist impression. Patient ambulatory with walker at least 20 ft without difficulty or assistance. As fall was mechanical do not feel emergent labs or further imaging is warranted at this time. Findings and plan of  care discussed with supervising physician Dr. Rogene Houston who agrees with plan of care. Patient eager to be discharged home.  The patient appears reasonably screened and/or stabilized for discharge and I doubt any other medical condition or other [facility] requiring further screening, evaluation, or treatment in the ED at this time prior to discharge. The patient is safe for discharge with strict return precautions discussed. Recommend pcp follow up as needed.   Portions of this note were generated with Lobbyist. Dictation errors may occur despite best attempts at proofreading.     Final Clinical Impression(s) / ED Diagnoses Final diagnoses:  Fall, initial encounter    Rx / DC Orders ED Discharge Orders    None  Barrie Folk, PA-C 07/23/20 1626    Fredia Sorrow, MD 08/04/20 [phone]

## 2020-07-23 NOTE — ED Notes (Signed)
Pt ambulated in room 20 feet around room with walker.

## 2020-07-23 NOTE — ED Triage Notes (Signed)
Pt fell while walking with walker in her house. Denies any LOC. Was in the floor for 10 mins

## 2020-07-25 ENCOUNTER — Ambulatory Visit (HOSPITAL_COMMUNITY): Payer: Medicaid Other | Attending: Family Medicine

## 2020-08-09 ENCOUNTER — Ambulatory Visit: Payer: Medicaid Other | Admitting: Infectious Disease

## 2020-08-30 ENCOUNTER — Other Ambulatory Visit: Payer: Self-pay

## 2020-08-30 DIAGNOSIS — Z992 Dependence on renal dialysis: Secondary | ICD-10-CM

## 2020-08-30 DIAGNOSIS — N186 End stage renal disease: Secondary | ICD-10-CM

## 2020-08-31 ENCOUNTER — Ambulatory Visit (HOSPITAL_COMMUNITY)
Admission: RE | Admit: 2020-08-31 | Discharge: 2020-08-31 | Disposition: A | Discharge status: Home / Self Care | Payer: Medicaid Other | Source: Ambulatory Visit | Attending: Vascular Surgery | Admitting: Vascular Surgery

## 2020-08-31 ENCOUNTER — Other Ambulatory Visit: Payer: Self-pay

## 2020-08-31 ENCOUNTER — Encounter: Payer: Self-pay | Admitting: Vascular Surgery

## 2020-08-31 ENCOUNTER — Ambulatory Visit (INDEPENDENT_AMBULATORY_CARE_PROVIDER_SITE_OTHER): Payer: Medicaid Other | Admitting: Vascular Surgery

## 2020-08-31 VITALS — BP 138/81 | HR 66 | Temp 97.6°F | Resp 20 | Ht 67.0 in | Wt 300.0 lb

## 2020-08-31 DIAGNOSIS — Z992 Dependence on renal dialysis: Secondary | ICD-10-CM | POA: Diagnosis not present

## 2020-08-31 DIAGNOSIS — N186 End stage renal disease: Secondary | ICD-10-CM | POA: Diagnosis present

## 2020-08-31 MED ORDER — SODIUM CHLORIDE 0.9% FLUSH: 3.0000 mL | INTRAVENOUS | Status: DC | PRN

## 2020-08-31 MED ORDER — SODIUM CHLORIDE 0.9% FLUSH: 3.0000 mL | Freq: Two times a day (BID) | INTRAVENOUS | Status: DC

## 2020-08-31 MED ORDER — SODIUM CHLORIDE 0.9 % IV SOLN: 250.0000 mL | INTRAVENOUS | Status: DC | PRN

## 2020-08-31 NOTE — Progress Notes (Signed)
Patient ID: [name], female   DOB: 05-Oct-1965, 55 y.o.   MRN: [id number]  Reason for Consult: No chief complaint on file.   Referred by Emelia Loron, NP  Subjective:     HPI:  Martha [name] is a 55 y.o. female previous left second stage basilic vein fistula in September 2020.  She has a previous fistula ligated on the right arm.  She states that she has pain in her left hand particularly with dialysis but also in the middle of the night that wakes her up.  She denies any tissue loss or ulceration.  She is not on any blood thinners.  She is on dialysis Mondays, Wednesdays and Fridays.    Past Medical History:  Diagnosis Date  . Acid reflux    takes Tums  . Anemia   . Arthritis   . Bipolar 1 disorder ([place])   . Blindness of left eye    left eye removed  . Carpal tunnel syndrome, bilateral   . Cervical radiculopathy   . CHF (congestive heart failure) ([place])   . Chronic kidney disease    Stage 5- 01/25/17  . Constipation   . COPD (chronic obstructive pulmonary disease) ([place]) 2014   bronchitis  . Degenerative disc disease, thoracic   . Depression   . Diabetes mellitus    Type II  . Diabetic retinopathy ([place])   . Dyspnea    when walking  . End stage kidney disease ([place])    M, W, F [facility]  . Gout   . Hypertension   . Noncompliance with medication regimen   . Noncompliance with medication regimen   . Obesity (BMI 30-39.9)   . OSA (obstructive sleep apnea)    cpap  . Panic attack   . RLS (restless legs syndrome)    Family History  Adopted: Yes  Family history unknown: Yes   Past Surgical History:  Procedure Laterality Date  . AV FISTULA PLACEMENT Right 01/27/2017   Procedure: ARTERIOVENOUS (AV) FISTULA CREATION-RIGHT ARM;  Surgeon: Elam Dutch, MD;  Location: [facility] OR;  Service: Vascular;  Laterality: Right;  . AV FISTULA PLACEMENT Left 08/03/2018   Procedure: LEFT ARTERIOVENOUS (AV) FISTULA CREATION;  Surgeon: Elam Dutch, MD;  Location: [place];   Service: Vascular;  Laterality: Left;  . BASCILIC VEIN TRANSPOSITION Left 04/26/2019   Procedure: SECOND STAGE BASILIC VEIN TRANSPOSITION LEFT ARM;  Surgeon: Elam Dutch, MD;  Location: [place];  Service: Vascular;  Laterality: Left;  . BIOPSY N/A 05/17/2013   Procedure: BIOPSY;  Surgeon: [name], MD;  [facility];  Service: Endoscopy;  Laterality: N/A;  . CARPAL TUNNEL RELEASE Left 11/25/2018   Procedure: LEFT CARPAL TUNNEL RELEASE;  Surgeon: Charlotte Crumb, MD;  Location: [place];  Service: Orthopedics;  Laterality: Left;  . CATARACT EXTRACTION Right   . CHOLECYSTECTOMY N/A 04/27/2014   Procedure: LAPAROSCOPIC CHOLECYSTECTOMY;  Surgeon: [name], MD;  [facility];  Service: General;  Laterality: N/A;  . COLONOSCOPY  06/06/2010   [id number] bleeding secondary to internal hemorrhoids but incomplete evaluation secondary to poor right colon prep/small rectal and sigmoid colon polyps (hyperplastic). PROPOFOL  . COLONOSCOPY  May 2013   Dr. Gilliam/[facility]: 5 mm a descending colon polyp, hyperplastic. Adequate bowel prep.  . ENDOMETRIAL ABLATION    . 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 ENUCLEATION Left 02/04/2018   Procedure: ENUCLEATION WITH PLACEMENT OF IMPLANT LEFT EYE;  Surgeon: Clista Bernhardt, MD;  Location: [place];  Service: Ophthalmology;  Laterality: Left;  . ESOPHAGOGASTRODUODENOSCOPY  06/06/2010   [id number] erythema and edema of body of stomach, with sessile polypoid lesions. bx benign. no h.pylori  . ESOPHAGOGASTRODUODENOSCOPY (EGD) WITH PROPOFOL N/A 05/17/2013   Dr. Oneida Alar: normal esophagus, moderate nodular gastritis, negative path, empiric Savary dilation  . EYE SURGERY  07/2017   sx for glaucoma  . FLEXIBLE SIGMOIDOSCOPY N/A 05/17/2013   Dr. Oneida Alar: moderate sized internal hemorrhoids  . HEMORRHOID BANDING N/A 05/17/2013   Procedure: HEMORRHOID BANDING;  Surgeon: [name], MD;  [facility];   Service: Endoscopy;  Laterality: N/A;  2 bands placed  . INCISION AND DRAINAGE ABSCESS Left 10/11/2013   Procedure: INCISION AND DRAINAGE AND DEBRIDEMENT LEFT BREAST  ABSCESS;  Surgeon: [name], MD;  [facility];  Service: General;  Laterality: Left;  . INSERTION OF DIALYSIS CATHETER N/A 06/08/2018   Procedure: INSERTION OF Right internal jugular TUNNELED  DIALYSIS CATHETER;  Surgeon: Angelia Mould, MD;  Location: [place];  Service: Vascular;  Laterality: N/A;  . IRRIGATION AND DEBRIDEMENT ABSCESS Right 06/01/2013   Procedure: INCISION AND DRAINAGE AND DEBRIDEMENT ABSCESS RIGHT BREAST;  Surgeon: [name], MD;  [facility];  Service: General;  Laterality: Right;  . LEFT EYE REMOVED Left 01/2018   [facility] on [address].  Marland Kitchen LIGATION OF ARTERIOVENOUS  FISTULA Right 06/08/2018   Procedure: LIGATION OF ARTERIOVENOUS  FISTULA RIGHT ARM;  Surgeon: Angelia Mould, MD;  Location: [place];  Service: Vascular;  Laterality: Right;  . ORIF ANKLE FRACTURE Right 12/17/2018   Procedure: OPEN REDUCTION INTERNAL FIXATION (ORIF) ANKLE FRACTURE;  Surgeon: Edrick Kins, DPM;  Location: [place];  Service: Podiatry;  Laterality: Right;  . SAVORY DILATION N/A 05/17/2013   Procedure: SAVORY DILATION;  Surgeon: [name], MD;  [facility];  Service: Endoscopy;  Laterality: N/A;  14/15/16  . SKIN FULL THICKNESS GRAFT Left 05/05/2018   Procedure: ABDOMINAL DERMIS FAT SKIN GRAFT FULL THICKNESS LEFT EYE;  Surgeon: Clista Bernhardt, MD;  Location: [place];  Service: Ophthalmology;  Laterality: Left;  . TUBAL LIGATION      Short Social History:  Social History   Tobacco Use  . Smoking status: Former Smoker    Packs/day: 1.50    Years: 25.00    Pack years: 37.50    Types: Cigarettes    Quit date: 10/23/2007    Years since quitting: 12.8  . Smokeless tobacco: Never Used  Substance Use Topics  . Alcohol use: No    Allergies  Allergen Reactions  .  Codeine Nausea And Vomiting  . Tape Rash and Other (See Comments)    Pull skin off.  Paper tape is ok    Current Outpatient Medications  Medication Sig Dispense Refill  . albuterol (PROVENTIL) (2.5 MG/3ML) 0.083% nebulizer solution Take 2.5 mg by nebulization every 6 (six) hours as needed for wheezing or shortness of breath.    . ALPRAZolam (XANAX) 1 MG tablet Take 1 tablet (1 mg total) by mouth 4 (four) times daily as needed for anxiety. 9 tablet 0  . bisacodyl (DULCOLAX) 10 MG suppository Place 1 suppository (10 mg total) rectally daily as needed for moderate constipation. 12 suppository 0  . busPIRone (BUSPAR) 7.5 MG tablet Take 1 tablet (7.5 mg total) by mouth 2 (two) times daily. 7 tablet 0  . calcium carbonate (TUMS EX) 750 MG chewable tablet Chew 2-4  tablets by mouth See admin instructions. Take 4 tablet with each meal & take 2 tablets with each snack    . cinacalcet (SENSIPAR) 30 MG tablet Take 30 mg by mouth daily.    . colchicine 0.6 MG tablet Take 1 tablet (0.6 mg total) by mouth every Tuesday, Thursday, and Saturday at 6 PM. Take 1 tablet every other day (Patient taking differently: Take 0.6 mg by mouth every Tuesday, Thursday, and Saturday at 6 PM.)    . cyclobenzaprine (FLEXERIL) 10 MG tablet Take 1 tablet (10 mg total) by mouth 2 (two) times daily as needed for muscle spasms. 20 tablet 0  . dorzolamide-timolol (COSOPT) 22.3-6.8 MG/ML ophthalmic solution Place 1 drop into the right eye in the morning and at bedtime.    Marland Kitchen doxercalciferol (HECTOROL) 4 MCG/2ML injection Inject 0.5 mLs (1 mcg total) into the vein every Monday, Wednesday, and Friday with hemodialysis. 2 mL   . famotidine (PEPCID) 20 MG tablet Take 1 tablet (20 mg total) by mouth daily.    Marland Kitchen gabapentin (NEURONTIN) 100 MG capsule Take 1 capsule (100 mg total) by mouth at bedtime. 7 capsule 0  . insulin aspart (NOVOLOG) 100 UNIT/ML injection Inject 0-9 Units into the skin 3 (three) times daily with meals. (Patient taking  differently: Inject 2-15 Units into the skin 3 (three) times daily with meals. Sliding scale insulin) 10 mL 11  . Insulin Glargine (BASAGLAR KWIKPEN) 100 UNIT/ML Inject 30 Units into the skin at bedtime. 10 mL 0  . lidocaine-prilocaine (EMLA) cream Apply 1 application topically as needed (topical anesthesia for hemodialysis if Gebauers and Lidocaine injection are ineffective.). (Patient taking differently: Apply 1 application topically daily as needed (dialysis.).) 30 g 0  . omeprazole (PRILOSEC) 40 MG capsule Take 40 mg by mouth daily.    Marland Kitchen PARoxetine (PAXIL) 20 MG tablet Take 1 tablet (20 mg total) by mouth every evening. This is to prevent panic attacks 7 tablet 0  . predniSONE (DELTASONE) 20 MG tablet Take 2 tablets (40 mg total) by mouth daily with breakfast. 4 tablet 0  . sevelamer carbonate (RENVELA) 800 MG tablet Take 1 tablet (800 mg total) by mouth 3 (three) times daily with meals. 9 tablet 0   No current facility-administered medications for this visit.    Review of Systems  Constitutional:  Constitutional negative. HENT: HENT negative.  Eyes: Eyes negative.  Respiratory: Respiratory negative.  Cardiovascular: Cardiovascular negative.  GI: Gastrointestinal negative.  Musculoskeletal:       Left arm pain Skin: Skin negative.  Neurological: Neurological negative. Hematologic: Hematologic/lymphatic negative.  Psychiatric: Psychiatric negative.        Objective:  Objective  Vitals:   08/31/20 1544  BP: 138/81  Pulse: 66  Resp: 20  Temp: 97.6 F (36.4 C)  SpO2: 98%    Physical Exam HENT:     Head: Normocephalic.     Mouth/Throat:     Comments: Wearing a mask Eyes:     Pupils: Pupils are equal, round, and reactive to light.  Cardiovascular:     Comments: There is a very strong thrill in the left upper arm fistula The left radial artery signal is much improved with compression of the fistula There are no palpable pulses even with compression of the fistula at the  wrist Pulmonary:     Effort: Pulmonary effort is normal.  Abdominal:     General: Abdomen is flat.  Musculoskeletal:        General: Normal range of motion.  Skin:  General: Skin is warm and dry.     Capillary Refill: Capillary refill takes less than 2 seconds.  Neurological:     General: No focal deficit present.     Mental Status: She is alert. Mental status is at baseline. She is disoriented.  Psychiatric:        Mood and Affect: Mood normal.        Behavior: Behavior normal.        Thought Content: Thought content normal.        Judgment: Judgment normal.     Data: I have independently interpreted her left upper extremity steal study which demonstrates left second digit pressure increased from 92 to 182 mmHg with fistula compression.     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  I have offered her continued nonoperative treatment with wearing a glove and keeping hand warm versus the opposite extreme which would be ligation of the fistula and she wants neither of these.  Prior to any other intervention I have recommended angiogram from a femoral approach to evaluate the arch and subclavian axillary and brachial arteries for inflow issues.  From there I discussed any further intervention could possibly lead to thrombosis of the fistula these would include banding and/or distal revascularization with interval ligation depending on the quality of the distal vessels.  She demonstrates good understanding would like to proceed with angiography.  As she is on dialysis Mondays and Wednesdays and Fridays this will need to be one of my partners and I can discuss with her afterwards her options moving forward.     Waynetta Sandy MD [facility]

## 2020-08-31 NOTE — H&P (View-Only) (Signed)
Patient ID: Martha [name], female   DOB: 04/16/1966, 55 y.o.   MRN: [id number]  Reason for Consult: No chief complaint on file.   Referred by Emelia Loron, NP  Subjective:     HPI:  Martha [name] is a 55 y.o. female previous left second stage basilic vein fistula in September 2020.  She has a previous fistula ligated on the right arm.  She states that she has pain in her left hand particularly with dialysis but also in the middle of the night that wakes her up.  She denies any tissue loss or ulceration.  She is not on any blood thinners.  She is on dialysis Mondays, Wednesdays and Fridays.    Past Medical History:  Diagnosis Date  . Acid reflux    takes Tums  . Anemia   . Arthritis   . Bipolar 1 disorder ([place])   . Blindness of left eye    left eye removed  . Carpal tunnel syndrome, bilateral   . Cervical radiculopathy   . CHF (congestive heart failure) ([place])   . Chronic kidney disease    Stage 5- 01/25/17  . Constipation   . COPD (chronic obstructive pulmonary disease) ([place]) 2014   bronchitis  . Degenerative disc disease, thoracic   . Depression   . Diabetes mellitus    Type II  . Diabetic retinopathy ([name])   . Dyspnea    when walking  . End stage kidney disease ([place])    M, W, F [facility]  . Gout   . Hypertension   . Noncompliance with medication regimen   . Noncompliance with medication regimen   . Obesity (BMI 30-39.9)   . OSA (obstructive sleep apnea)    cpap  . Panic attack   . RLS (restless legs syndrome)    Family History  Adopted: Yes  Family history unknown: Yes   Past Surgical History:  Procedure Laterality Date  . AV FISTULA PLACEMENT Right 01/27/2017   Procedure: ARTERIOVENOUS (AV) FISTULA CREATION-RIGHT ARM;  Surgeon: Elam Dutch, MD;  Location: [facility] OR;  Service: Vascular;  Laterality: Right;  . AV FISTULA PLACEMENT Left 08/03/2018   Procedure: LEFT ARTERIOVENOUS (AV) FISTULA CREATION;  Surgeon: Elam Dutch, MD;  Location: [place];   Service: Vascular;  Laterality: Left;  . BASCILIC VEIN TRANSPOSITION Left 04/26/2019   Procedure: SECOND STAGE BASILIC VEIN TRANSPOSITION LEFT ARM;  Surgeon: Elam Dutch, MD;  Location: [place];  Service: Vascular;  Laterality: Left;  . BIOPSY N/A 05/17/2013   Procedure: BIOPSY;  Surgeon: [name], MD;  [facility];  Service: Endoscopy;  Laterality: N/A;  . CARPAL TUNNEL RELEASE Left 11/25/2018   Procedure: LEFT CARPAL TUNNEL RELEASE;  Surgeon: Charlotte Crumb, MD;  Location: [place];  Service: Orthopedics;  Laterality: Left;  . CATARACT EXTRACTION Right   . CHOLECYSTECTOMY N/A 04/27/2014   Procedure: LAPAROSCOPIC CHOLECYSTECTOMY;  Surgeon: [name], MD;  [facility];  Service: General;  Laterality: N/A;  . COLONOSCOPY  06/06/2010   [id number] bleeding secondary to internal hemorrhoids but incomplete evaluation secondary to poor right colon prep/small rectal and sigmoid colon polyps (hyperplastic). PROPOFOL  . COLONOSCOPY  May 2013   Dr. Gilliam/[facility]: 5 mm a descending colon polyp, hyperplastic. Adequate bowel prep.  . ENDOMETRIAL ABLATION    . 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 ENUCLEATION Left 02/04/2018   Procedure: ENUCLEATION WITH PLACEMENT OF IMPLANT LEFT EYE;  Surgeon: Clista Bernhardt, MD;  Location: [place];  Service: Ophthalmology;  Laterality: Left;  . ESOPHAGOGASTRODUODENOSCOPY  06/06/2010   [id number] erythema and edema of body of stomach, with sessile polypoid lesions. bx benign. no h.pylori  . ESOPHAGOGASTRODUODENOSCOPY (EGD) WITH PROPOFOL N/A 05/17/2013   Dr. Oneida Alar: normal esophagus, moderate nodular gastritis, negative path, empiric Savary dilation  . EYE SURGERY  07/2017   sx for glaucoma  . FLEXIBLE SIGMOIDOSCOPY N/A 05/17/2013   Dr. Oneida Alar: moderate sized internal hemorrhoids  . HEMORRHOID BANDING N/A 05/17/2013   Procedure: HEMORRHOID BANDING;  Surgeon: [name], MD;  [facility];   Service: Endoscopy;  Laterality: N/A;  2 bands placed  . INCISION AND DRAINAGE ABSCESS Left 10/11/2013   Procedure: INCISION AND DRAINAGE AND DEBRIDEMENT LEFT BREAST  ABSCESS;  Surgeon: [name], MD;  [facility];  Service: General;  Laterality: Left;  . INSERTION OF DIALYSIS CATHETER N/A 06/08/2018   Procedure: INSERTION OF Right internal jugular TUNNELED  DIALYSIS CATHETER;  Surgeon: Angelia Mould, MD;  Location: [place];  Service: Vascular;  Laterality: N/A;  . IRRIGATION AND DEBRIDEMENT ABSCESS Right 06/01/2013   Procedure: INCISION AND DRAINAGE AND DEBRIDEMENT ABSCESS RIGHT BREAST;  Surgeon: [name], MD;  [facility];  Service: General;  Laterality: Right;  . LEFT EYE REMOVED Left 01/2018   [facility] on [address].  Marland Kitchen LIGATION OF ARTERIOVENOUS  FISTULA Right 06/08/2018   Procedure: LIGATION OF ARTERIOVENOUS  FISTULA RIGHT ARM;  Surgeon: Angelia Mould, MD;  Location: [place];  Service: Vascular;  Laterality: Right;  . ORIF ANKLE FRACTURE Right 12/17/2018   Procedure: OPEN REDUCTION INTERNAL FIXATION (ORIF) ANKLE FRACTURE;  Surgeon: Edrick Kins, DPM;  Location: [place];  Service: Podiatry;  Laterality: Right;  . SAVORY DILATION N/A 05/17/2013   Procedure: SAVORY DILATION;  Surgeon: [name], MD;  [facility];  Service: Endoscopy;  Laterality: N/A;  14/15/16  . SKIN FULL THICKNESS GRAFT Left 05/05/2018   Procedure: ABDOMINAL DERMIS FAT SKIN GRAFT FULL THICKNESS LEFT EYE;  Surgeon: Clista Bernhardt, MD;  Location: [place];  Service: Ophthalmology;  Laterality: Left;  . TUBAL LIGATION      Short Social History:  Social History   Tobacco Use  . Smoking status: Former Smoker    Packs/day: 1.50    Years: 25.00    Pack years: 37.50    Types: Cigarettes    Quit date: 10/23/2007    Years since quitting: 12.8  . Smokeless tobacco: Never Used  Substance Use Topics  . Alcohol use: No    Allergies  Allergen Reactions  .  Codeine Nausea And Vomiting  . Tape Rash and Other (See Comments)    Pull skin off.  Paper tape is ok    Current Outpatient Medications  Medication Sig Dispense Refill  . albuterol (PROVENTIL) (2.5 MG/3ML) 0.083% nebulizer solution Take 2.5 mg by nebulization every 6 (six) hours as needed for wheezing or shortness of breath.    . ALPRAZolam (XANAX) 1 MG tablet Take 1 tablet (1 mg total) by mouth 4 (four) times daily as needed for anxiety. 9 tablet 0  . bisacodyl (DULCOLAX) 10 MG suppository Place 1 suppository (10 mg total) rectally daily as needed for moderate constipation. 12 suppository 0  . busPIRone (BUSPAR) 7.5 MG tablet Take 1 tablet (7.5 mg total) by mouth 2 (two) times daily. 7 tablet 0  . calcium carbonate (TUMS EX) 750 MG chewable tablet Chew 2-4  tablets by mouth See admin instructions. Take 4 tablet with each meal & take 2 tablets with each snack    . cinacalcet (SENSIPAR) 30 MG tablet Take 30 mg by mouth daily.    . colchicine 0.6 MG tablet Take 1 tablet (0.6 mg total) by mouth every Tuesday, Thursday, and Saturday at 6 PM. Take 1 tablet every other day (Patient taking differently: Take 0.6 mg by mouth every Tuesday, Thursday, and Saturday at 6 PM.)    . cyclobenzaprine (FLEXERIL) 10 MG tablet Take 1 tablet (10 mg total) by mouth 2 (two) times daily as needed for muscle spasms. 20 tablet 0  . dorzolamide-timolol (COSOPT) 22.3-6.8 MG/ML ophthalmic solution Place 1 drop into the right eye in the morning and at bedtime.    Marland Kitchen doxercalciferol (HECTOROL) 4 MCG/2ML injection Inject 0.5 mLs (1 mcg total) into the vein every Monday, Wednesday, and Friday with hemodialysis. 2 mL   . famotidine (PEPCID) 20 MG tablet Take 1 tablet (20 mg total) by mouth daily.    Marland Kitchen gabapentin (NEURONTIN) 100 MG capsule Take 1 capsule (100 mg total) by mouth at bedtime. 7 capsule 0  . insulin aspart (NOVOLOG) 100 UNIT/ML injection Inject 0-9 Units into the skin 3 (three) times daily with meals. (Patient taking  differently: Inject 2-15 Units into the skin 3 (three) times daily with meals. Sliding scale insulin) 10 mL 11  . Insulin Glargine (BASAGLAR KWIKPEN) 100 UNIT/ML Inject 30 Units into the skin at bedtime. 10 mL 0  . lidocaine-prilocaine (EMLA) cream Apply 1 application topically as needed (topical anesthesia for hemodialysis if Gebauers and Lidocaine injection are ineffective.). (Patient taking differently: Apply 1 application topically daily as needed (dialysis.).) 30 g 0  . omeprazole (PRILOSEC) 40 MG capsule Take 40 mg by mouth daily.    Marland Kitchen PARoxetine (PAXIL) 20 MG tablet Take 1 tablet (20 mg total) by mouth every evening. This is to prevent panic attacks 7 tablet 0  . predniSONE (DELTASONE) 20 MG tablet Take 2 tablets (40 mg total) by mouth daily with breakfast. 4 tablet 0  . sevelamer carbonate (RENVELA) 800 MG tablet Take 1 tablet (800 mg total) by mouth 3 (three) times daily with meals. 9 tablet 0   No current facility-administered medications for this visit.    Review of Systems  Constitutional:  Constitutional negative. HENT: HENT negative.  Eyes: Eyes negative.  Respiratory: Respiratory negative.  Cardiovascular: Cardiovascular negative.  GI: Gastrointestinal negative.  Musculoskeletal:       Left arm pain Skin: Skin negative.  Neurological: Neurological negative. Hematologic: Hematologic/lymphatic negative.  Psychiatric: Psychiatric negative.        Objective:  Objective  Vitals:   08/31/20 1544  BP: 138/81  Pulse: 66  Resp: 20  Temp: 97.6 F (36.4 C)  SpO2: 98%    Physical Exam HENT:     Head: Normocephalic.     Mouth/Throat:     Comments: Wearing a mask Eyes:     Pupils: Pupils are equal, round, and reactive to light.  Cardiovascular:     Comments: There is a very strong thrill in the left upper arm fistula The left radial artery signal is much improved with compression of the fistula There are no palpable pulses even with compression of the fistula at the  wrist Pulmonary:     Effort: Pulmonary effort is normal.  Abdominal:     General: Abdomen is flat.  Musculoskeletal:        General: Normal range of motion.  Skin:  General: Skin is warm and dry.     Capillary Refill: Capillary refill takes less than 2 seconds.  Neurological:     General: No focal deficit present.     Mental Status: She is alert. Mental status is at baseline. She is disoriented.  Psychiatric:        Mood and Affect: Mood normal.        Behavior: Behavior normal.        Thought Content: Thought content normal.        Judgment: Judgment normal.     Data: I have independently interpreted her left upper extremity steal study which demonstrates left second digit pressure increased from 92 to 182 mmHg with fistula compression.     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  I have offered her continued nonoperative treatment with wearing a glove and keeping hand warm versus the opposite extreme which would be ligation of the fistula and she wants neither of these.  Prior to any other intervention I have recommended angiogram from a femoral approach to evaluate the arch and subclavian axillary and brachial arteries for inflow issues.  From there I discussed any further intervention could possibly lead to thrombosis of the fistula these would include banding and/or distal revascularization with interval ligation depending on the quality of the distal vessels.  She demonstrates good understanding would like to proceed with angiography.  As she is on dialysis Mondays and Wednesdays and Fridays this will need to be one of my partners and I can discuss with her afterwards her options moving forward.     Waynetta Sandy MD [facility]

## 2020-09-10 ENCOUNTER — Other Ambulatory Visit (HOSPITAL_COMMUNITY)
Admission: RE | Admit: 2020-09-10 | Discharge: 2020-09-10 | Disposition: A | Discharge status: Home / Self Care | Payer: Medicaid Other | Source: Ambulatory Visit | Attending: Surgery | Admitting: Surgery

## 2020-09-10 ENCOUNTER — Other Ambulatory Visit (HOSPITAL_COMMUNITY): Payer: Medicaid Other

## 2020-09-10 DIAGNOSIS — Z20822 Contact with and (suspected) exposure to covid-19: Secondary | ICD-10-CM | POA: Insufficient documentation

## 2020-09-10 DIAGNOSIS — Z01812 Encounter for preprocedural laboratory examination: Secondary | ICD-10-CM | POA: Insufficient documentation

## 2020-09-11 ENCOUNTER — Ambulatory Visit (HOSPITAL_COMMUNITY)
Admission: RE | Admit: 2020-09-11 | Discharge: 2020-09-11 | Disposition: A | Discharge status: Home / Self Care | Payer: Medicaid Other | Source: Ambulatory Visit | Attending: Surgery | Admitting: Surgery

## 2020-09-11 ENCOUNTER — Encounter (HOSPITAL_COMMUNITY)
Admission: RE | Disposition: A | Discharge status: Home / Self Care | Payer: Self-pay | Source: Ambulatory Visit | Attending: Surgery

## 2020-09-11 DIAGNOSIS — T82898A Other specified complication of vascular prosthetic devices, implants and grafts, initial encounter: Secondary | ICD-10-CM | POA: Insufficient documentation

## 2020-09-11 DIAGNOSIS — E1122 Type 2 diabetes mellitus with diabetic chronic kidney disease: Secondary | ICD-10-CM | POA: Insufficient documentation

## 2020-09-11 DIAGNOSIS — Z992 Dependence on renal dialysis: Secondary | ICD-10-CM | POA: Diagnosis not present

## 2020-09-11 DIAGNOSIS — Z87891 Personal history of nicotine dependence: Secondary | ICD-10-CM | POA: Diagnosis not present

## 2020-09-11 DIAGNOSIS — I120 Hypertensive chronic kidney disease with stage 5 chronic kidney disease or end stage renal disease: Secondary | ICD-10-CM | POA: Insufficient documentation

## 2020-09-11 DIAGNOSIS — N185 Chronic kidney disease, stage 5: Secondary | ICD-10-CM | POA: Diagnosis not present

## 2020-09-11 DIAGNOSIS — Y841 Kidney dialysis as the cause of abnormal reaction of the patient, or of later complication, without mention of misadventure at the time of the procedure: Secondary | ICD-10-CM | POA: Insufficient documentation

## 2020-09-11 DIAGNOSIS — Z79899 Other long term (current) drug therapy: Secondary | ICD-10-CM | POA: Insufficient documentation

## 2020-09-11 DIAGNOSIS — Z794 Long term (current) use of insulin: Secondary | ICD-10-CM | POA: Insufficient documentation

## 2020-09-11 DIAGNOSIS — Z885 Allergy status to narcotic agent status: Secondary | ICD-10-CM | POA: Insufficient documentation

## 2020-09-11 DIAGNOSIS — N186 End stage renal disease: Secondary | ICD-10-CM | POA: Insufficient documentation

## 2020-09-11 HISTORY — PX: AORTIC ARCH ANGIOGRAPHY: CATH118224

## 2020-09-11 LAB — POCT I-STAT, CHEM 8
BUN: 20 mg/dL (ref 6–20)
Calcium, Ion: 0.96 mmol/L — ABNORMAL LOW (ref 1.15–1.40)
Chloride: 103 mmol/L (ref 98–111)
Creatinine, Ser: 6.8 mg/dL — ABNORMAL HIGH (ref 0.44–1.00)
Glucose, Bld: 124 mg/dL — ABNORMAL HIGH (ref 70–99)
HCT: 30 % — ABNORMAL LOW (ref 36.0–46.0)
Hemoglobin: 10.2 g/dL — ABNORMAL LOW (ref 12.0–15.0)
Potassium: 3.5 mmol/L (ref 3.5–5.1)
Sodium: 143 mmol/L (ref 135–145)
TCO2: 25 mmol/L (ref 22–32)

## 2020-09-11 LAB — GLUCOSE, CAPILLARY: Glucose-Capillary: 115 mg/dL — ABNORMAL HIGH (ref 70–99)

## 2020-09-11 LAB — SARS CORONAVIRUS 2 (TAT 6-24 HRS): SARS Coronavirus 2: NEGATIVE

## 2020-09-11 SURGERY — AORTIC ARCH ANGIOGRAPHY
Anesthesia: LOCAL

## 2020-09-11 MED ORDER — MORPHINE SULFATE (PF) 4 MG/ML IV SOLN: 4.0000 mg | INTRAVENOUS | Status: DC | PRN

## 2020-09-11 MED ORDER — ONDANSETRON HCL 4 MG/2ML IJ SOLN
INTRAMUSCULAR | Status: AC
  Administered 2020-09-11: 4 mg via INTRAVENOUS
  Filled 2020-09-11: qty 2

## 2020-09-11 MED ORDER — ONDANSETRON HCL 4 MG/2ML IJ SOLN: 4.0000 mg | INTRAMUSCULAR | Status: AC

## 2020-09-11 MED ORDER — MIDAZOLAM HCL 5 MG/5ML IJ SOLN
INTRAMUSCULAR | Status: AC
  Filled 2020-09-11: qty 5

## 2020-09-11 MED ORDER — MIDAZOLAM HCL 2 MG/2ML IJ SOLN
INTRAMUSCULAR | Status: DC | PRN
  Administered 2020-09-11 (×2): 1 mg via INTRAVENOUS

## 2020-09-11 MED ORDER — SODIUM CHLORIDE 0.9% FLUSH: 3.0000 mL | Freq: Two times a day (BID) | INTRAVENOUS | Status: DC

## 2020-09-11 MED ORDER — HEPARIN SODIUM (PORCINE) 1000 UNIT/ML IJ SOLN
INTRAMUSCULAR | Status: AC
  Filled 2020-09-11: qty 1

## 2020-09-11 MED ORDER — IODIXANOL 320 MG/ML IV SOLN
INTRAVENOUS | Status: DC | PRN
  Administered 2020-09-11: 120 mL via INTRA_ARTERIAL

## 2020-09-11 MED ORDER — SODIUM CHLORIDE 0.9 % IV SOLN: 250.0000 mL | INTRAVENOUS | Status: DC | PRN

## 2020-09-11 MED ORDER — SODIUM CHLORIDE 0.9% FLUSH: 3.0000 mL | INTRAVENOUS | Status: DC | PRN

## 2020-09-11 MED ORDER — LABETALOL HCL 5 MG/ML IV SOLN: 10.0000 mg | INTRAVENOUS | Status: DC | PRN

## 2020-09-11 MED ORDER — FENTANYL CITRATE (PF) 100 MCG/2ML IJ SOLN
INTRAMUSCULAR | Status: DC | PRN
  Administered 2020-09-11 (×3): 50 ug via INTRAVENOUS

## 2020-09-11 MED ORDER — HEPARIN SODIUM (PORCINE) 1000 UNIT/ML IJ SOLN
INTRAMUSCULAR | Status: DC | PRN
  Administered 2020-09-11: 6000 [IU] via INTRAVENOUS

## 2020-09-11 MED ORDER — ONDANSETRON HCL 4 MG/2ML IJ SOLN: 4.0000 mg | Freq: Four times a day (QID) | INTRAMUSCULAR | Status: DC | PRN

## 2020-09-11 MED ORDER — ACETAMINOPHEN 325 MG PO TABS: 650.0000 mg | ORAL_TABLET | ORAL | Status: DC | PRN

## 2020-09-11 MED ORDER — HEPARIN (PORCINE) IN NACL 1000-0.9 UT/500ML-% IV SOLN
INTRAVENOUS | Status: DC | PRN
  Administered 2020-09-11 (×2): 500 mL

## 2020-09-11 MED ORDER — FENTANYL CITRATE (PF) 100 MCG/2ML IJ SOLN
INTRAMUSCULAR | Status: AC
  Filled 2020-09-11: qty 2

## 2020-09-11 MED ORDER — HYDRALAZINE HCL 20 MG/ML IJ SOLN: 5.0000 mg | INTRAMUSCULAR | Status: DC | PRN

## 2020-09-11 MED ORDER — HEPARIN (PORCINE) IN NACL 1000-0.9 UT/500ML-% IV SOLN
INTRAVENOUS | Status: AC
  Filled 2020-09-11: qty 1000

## 2020-09-11 MED ORDER — LIDOCAINE HCL (PF) 1 % IJ SOLN
INTRAMUSCULAR | Status: AC
  Filled 2020-09-11: qty 30

## 2020-09-11 MED ORDER — LIDOCAINE HCL (PF) 1 % IJ SOLN
INTRAMUSCULAR | Status: DC | PRN
  Administered 2020-09-11: 18 mL via INTRADERMAL

## 2020-09-11 SURGICAL SUPPLY — 11 items
CATH ANGIO 5F BER2 100CM (CATHETERS) ×2 IMPLANT
CATH ANGIO 5F PIGTAIL 100CM (CATHETERS) ×2 IMPLANT
CLOSURE MYNX CONTROL 5F (Vascular Products) ×2 IMPLANT
GUIDEWIRE ANGLED .035X150CM (WIRE) ×2 IMPLANT
KIT MICROPUNCTURE NIT STIFF (SHEATH) ×2 IMPLANT
KIT PV (KITS) ×2 IMPLANT
SHEATH PINNACLE 5F 10CM (SHEATH) ×2 IMPLANT
SYR MEDRAD MARK 7 150ML (SYRINGE) ×2 IMPLANT
TRANSDUCER W/STOPCOCK (MISCELLANEOUS) ×2 IMPLANT
TRAY PV CATH (CUSTOM PROCEDURE TRAY) ×2 IMPLANT
WIRE STARTER BENTSON 035X150 (WIRE) ×2 IMPLANT

## 2020-09-11 NOTE — Discharge Instructions (Signed)

## 2020-09-11 NOTE — Interval H&P Note (Signed)
History and Physical Interval Note:  09/11/2020 10:14 AM  Martha [name]  has presented today for surgery, with the diagnosis of end stage renal.  The various methods of treatment have been discussed with the patient and family. After consideration of risks, benefits and other options for treatment, the patient has consented to  Procedure(s): AORTIC ARCH ANGIOGRAPHY (N/A) as a surgical intervention.  The patient's history has been reviewed, patient examined, no change in status, stable for surgery.  I have reviewed the patient's chart and labs.  Questions were answered to the patient's satisfaction.     Annamarie Major

## 2020-09-11 NOTE — Op Note (Signed)
    Patient name: [name] MRN: [id number] DOB: 1966-03-06 Sex: female  09/11/2020 Pre-operative Diagnosis: ESRD Post-operative diagnosis:  Same Surgeon:  Annamarie Major Procedure Performed:  1.  Ultrasound-guided access, right femoral artery  2.  Aortic arch angiogram  3.  Left upper extremity runoff  4.  Second-order catheterization  5.  Conscious sedation, 33 minutes   Indications: The patient is having steal symptoms from her fistula.  She is here today for arterial evaluation  Procedure:  The patient was identified in the holding area and taken to room 8.  The patient was then placed supine on the table and prepped and draped in the usual sterile fashion.  A time out was called.  Conscious sedation was administered with the use of IV fentanyl and Versed under continuous physician and nurse monitoring.  Heart rate, blood pressure, and oxygen saturation were continuously monitored.  Total sedation time was 33 minutes.  Ultrasound was used to evaluate the right common femoral artery.  It was patent .  A digital ultrasound image was acquired.  A micropuncture needle was used to access the right common femoral artery under ultrasound guidance.  An 018 wire was advanced without resistance and a micropuncture sheath was placed.  The 018 wire was removed and a benson wire was placed.  The micropuncture sheath was exchanged for a 5 french sheath.  A pigtail catheter was advanced to the ascending aorta and an aortic arch angiogram was performed.  Next using a Berenstein 2 catheter, the subclavian artery was selected.  The catheter was advanced over wire out into the axillary artery and left arm runoff was performed. Findings:   Aortic arch: A type I aortic arch was identified with normal branch anatomy.  No significant ostial great vessel stenosis was identified.  Left upper extremity: The left subclavian, axillary, and brachial artery are widely patent.  There is a fistula originating from the  brachial artery just above the antecubital crease.  The radial and ulnar artery are patent without stenosis and opacify the palmar arch.  Intervention: The groin was closed with a minx  Impression:  #1  Normal type I aortic arch without ostial great vessel stenosis  #2  Note hemodynamically significant stenosis identified within the left subclavian, axillary, brachial, radial, or ulnar arteries identified.    Theotis Burrow, M.D., [facility] [place] Office: [phone] Pager:  [phone]

## 2020-09-12 ENCOUNTER — Encounter (HOSPITAL_COMMUNITY): Payer: Self-pay | Admitting: Surgery

## 2020-09-14 ENCOUNTER — Other Ambulatory Visit (HOSPITAL_COMMUNITY): Payer: Medicaid Other

## 2020-09-14 ENCOUNTER — Ambulatory Visit: Payer: Medicaid Other | Admitting: Vascular Surgery

## 2020-09-20 ENCOUNTER — Other Ambulatory Visit: Payer: Self-pay | Admitting: *Deleted

## 2020-09-20 DIAGNOSIS — I739 Peripheral vascular disease, unspecified: Secondary | ICD-10-CM

## 2020-09-20 DIAGNOSIS — N186 End stage renal disease: Secondary | ICD-10-CM

## 2020-09-21 ENCOUNTER — Encounter: Payer: Self-pay | Admitting: *Deleted

## 2020-09-21 ENCOUNTER — Ambulatory Visit (INDEPENDENT_AMBULATORY_CARE_PROVIDER_SITE_OTHER): Payer: Medicaid Other | Admitting: Vascular Surgery

## 2020-09-21 ENCOUNTER — Encounter: Payer: Self-pay | Admitting: Vascular Surgery

## 2020-09-21 ENCOUNTER — Other Ambulatory Visit: Payer: Self-pay | Admitting: *Deleted

## 2020-09-21 ENCOUNTER — Ambulatory Visit (HOSPITAL_COMMUNITY)
Admission: RE | Admit: 2020-09-21 | Discharge: 2020-09-21 | Disposition: A | Discharge status: Home / Self Care | Payer: Medicaid Other | Source: Ambulatory Visit | Attending: Vascular Surgery | Admitting: Vascular Surgery

## 2020-09-21 ENCOUNTER — Other Ambulatory Visit: Payer: Self-pay

## 2020-09-21 VITALS — BP 151/87 | HR 64 | Temp 97.5°F | Resp 20 | Ht 67.0 in | Wt 308.0 lb

## 2020-09-21 DIAGNOSIS — I739 Peripheral vascular disease, unspecified: Secondary | ICD-10-CM | POA: Diagnosis present

## 2020-09-21 DIAGNOSIS — N186 End stage renal disease: Secondary | ICD-10-CM | POA: Insufficient documentation

## 2020-09-21 DIAGNOSIS — Z992 Dependence on renal dialysis: Secondary | ICD-10-CM

## 2020-09-21 NOTE — H&P (View-Only) (Signed)
Patient ID: Martha [name], female   DOB: [birth year], 55 y.o.   MRN: [id number]  Reason for Consult: No chief complaint on file.   Referred by Emelia Loron, NP  Subjective:     HPI:  Martha [name] is a 55 y.o. female with a two-stage left arm basilic vein fistula.  She does have a previous fistula ligated in her right arm.  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  She is on dialysis Monday Wednesdays and Fridays and she dialyzed earlier today.  She recently underwent angiography she is now here for follow-up to discuss possible intervention.  Past Medical History:  Diagnosis Date  . Acid reflux    takes Tums  . Anemia   . Arthritis   . Bipolar 1 disorder ([place])   . Blindness of left eye    left eye removed  . Carpal tunnel syndrome, bilateral   . Cervical radiculopathy   . CHF (congestive heart failure) ([place])   . Chronic kidney disease    Stage 5- 01/25/17  . Constipation   . COPD (chronic obstructive pulmonary disease) ([place]) 2014   bronchitis  . Degenerative disc disease, thoracic   . Depression   . Diabetes mellitus    Type II  . Diabetic retinopathy ([place])   . Dyspnea    when walking  . End stage kidney disease ([place])    M, W, F [facility]  . Gout   . Hypertension   . Noncompliance with medication regimen   . Noncompliance with medication regimen   . Obesity (BMI 30-39.9)   . OSA (obstructive sleep apnea)    cpap  . Panic attack   . RLS (restless legs syndrome)    Family History  Adopted: Yes  Family history unknown: Yes   Past Surgical History:  Procedure Laterality Date  . AORTIC ARCH ANGIOGRAPHY N/A 09/11/2020   Procedure: AORTIC ARCH ANGIOGRAPHY;  Surgeon: Serafina Mitchell, MD;  Location: [place] [facility];  Service: Cardiovascular;  Laterality: N/A;  . AV FISTULA PLACEMENT Right 01/27/2017   Procedure: ARTERIOVENOUS (AV) FISTULA CREATION-RIGHT ARM;  Surgeon: Elam Dutch, MD;   [facility];  Service: Vascular;  Laterality: Right;  . AV FISTULA PLACEMENT Left 08/03/2018   Procedure: LEFT ARTERIOVENOUS (AV) FISTULA CREATION;  Surgeon: Elam Dutch, MD;  Location: [place];  Service: Vascular;  Laterality: Left;  . BASCILIC VEIN TRANSPOSITION Left 04/26/2019   Procedure: SECOND STAGE BASILIC VEIN TRANSPOSITION LEFT ARM;  Surgeon: Elam Dutch, MD;  Location: [place];  Service: Vascular;  Laterality: Left;  . BIOPSY N/A 05/17/2013   Procedure: BIOPSY;  Surgeon: [name], MD;  Location: [facility];  Service: Endoscopy;  Laterality: N/A;  . CARPAL TUNNEL RELEASE Left 11/25/2018   Procedure: LEFT CARPAL TUNNEL RELEASE;  Surgeon: Charlotte Crumb, MD;  Location: [place];  Service: Orthopedics;  Laterality: Left;  . CATARACT EXTRACTION Right   . CHOLECYSTECTOMY N/A 04/27/2014   Procedure: LAPAROSCOPIC CHOLECYSTECTOMY;  Surgeon: [name], MD;  Location: [facility];  Service: General;  Laterality: N/A;  . COLONOSCOPY  06/06/2010   [id number] bleeding secondary to internal hemorrhoids but incomplete evaluation secondary to poor right colon prep/small rectal and sigmoid colon polyps (hyperplastic). PROPOFOL  . COLONOSCOPY  May 2013   Dr. Gilliam/[facility]: 5 mm a descending colon polyp, hyperplastic. Adequate bowel prep.  . ENDOMETRIAL ABLATION    .  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 ENUCLEATION Left 02/04/2018   Procedure: ENUCLEATION WITH PLACEMENT OF IMPLANT LEFT EYE;  Surgeon: Clista Bernhardt, MD;  Location: [place];  Service: Ophthalmology;  Laterality: Left;  . ESOPHAGOGASTRODUODENOSCOPY  06/06/2010   [id number] erythema and edema of body of stomach, with sessile polypoid lesions. bx benign. no h.pylori  . ESOPHAGOGASTRODUODENOSCOPY (EGD) WITH PROPOFOL N/A 05/17/2013   Dr. Oneida Alar: normal esophagus, moderate nodular gastritis, negative path, empiric Savary dilation  . EYE SURGERY  07/2017   sx for glaucoma  . FLEXIBLE  SIGMOIDOSCOPY N/A 05/17/2013   Dr. Oneida Alar: moderate sized internal hemorrhoids  . HEMORRHOID BANDING N/A 05/17/2013   Procedure: HEMORRHOID BANDING;  Surgeon: [name], MD;  Location[facility];  Service: Endoscopy;  Laterality: N/A;  2 bands placed  . INCISION AND DRAINAGE ABSCESS Left 10/11/2013   Procedure: INCISION AND DRAINAGE AND DEBRIDEMENT LEFT BREAST  ABSCESS;  Surgeon: [name], MD;  Location[facility];  Service: General;  Laterality: Left;  . INSERTION OF DIALYSIS CATHETER N/A 06/08/2018   Procedure: INSERTION OF Right internal jugular TUNNELED  DIALYSIS CATHETER;  Surgeon: Angelia Mould, MD;  Location: [place];  Service: Vascular;  Laterality: N/A;  . IRRIGATION AND DEBRIDEMENT ABSCESS Right 06/01/2013   Procedure: INCISION AND DRAINAGE AND DEBRIDEMENT ABSCESS RIGHT BREAST;  Surgeon: [name], MD;  [facility];  Service: General;  Laterality: Right;  . LEFT EYE REMOVED Left 01/2018   [facility] [address].  Marland Kitchen LIGATION OF ARTERIOVENOUS  FISTULA Right 06/08/2018   Procedure: LIGATION OF ARTERIOVENOUS  FISTULA RIGHT ARM;  Surgeon: Angelia Mould, MD;  Location: [place];  Service: Vascular;  Laterality: Right;  . ORIF ANKLE FRACTURE Right 12/17/2018   Procedure: OPEN REDUCTION INTERNAL FIXATION (ORIF) ANKLE FRACTURE;  Surgeon: Edrick Kins, DPM;  Location: [place];  Service: Podiatry;  Laterality: Right;  . SAVORY DILATION N/A 05/17/2013   Procedure: SAVORY DILATION;  Surgeon: [name], MD;  [facility];  Service: Endoscopy;  Laterality: N/A;  14/15/16  . SKIN FULL THICKNESS GRAFT Left 05/05/2018   Procedure: ABDOMINAL DERMIS FAT SKIN GRAFT FULL THICKNESS LEFT EYE;  Surgeon: Clista Bernhardt, MD;  Location: [place];  Service: Ophthalmology;  Laterality: Left;  . TUBAL LIGATION      Short Social History:  Social History   Tobacco Use  . Smoking status: Former Smoker    Packs/day: 1.50    Years: 25.00    Pack years:  37.50    Types: Cigarettes    Quit date: 10/23/2007    Years since quitting: 12.9  . Smokeless tobacco: Never Used  Substance Use Topics  . Alcohol use: No    Allergies  Allergen Reactions  . Codeine Nausea And Vomiting  . Tape Rash and Other (See Comments)    Pull skin off.  Paper tape is ok    Current Outpatient Medications  Medication Sig Dispense Refill  . albuterol (PROVENTIL) (2.5 MG/3ML) 0.083% nebulizer solution Take 2.5 mg by nebulization every 12 (twelve) hours as needed for wheezing or shortness of breath.    . ALPRAZolam (XANAX) 1 MG tablet Take 1 tablet (1 mg total) by mouth 4 (four) times daily as needed for anxiety. (Patient taking differently: Take 1 mg by mouth 4 (four) times daily.) 9 tablet 0  . Artificial Tear Ointment (DRY EYES OP) Place 1 drop into the right eye 2 (two)  times daily.    . busPIRone (BUSPAR) 7.5 MG tablet Take 1 tablet (7.5 mg total) by mouth 2 (two) times daily. 7 tablet 0  . calcium carbonate (TUMS EX) 750 MG chewable tablet Chew 1-3 tablets by mouth See admin instructions. Take 3 tablets with each meal & take 1 tablets with each snack    . cinacalcet (SENSIPAR) 60 MG tablet Take 60 mg by mouth daily.    . dorzolamide-timolol (COSOPT) 22.3-6.8 MG/ML ophthalmic solution Place 1 drop into the right eye in the morning and at bedtime.    Marland Kitchen doxercalciferol (HECTOROL) 4 MCG/2ML injection Inject 0.5 mLs (1 mcg total) into the vein every Monday, Wednesday, and Friday with hemodialysis. 2 mL   . gabapentin (NEURONTIN) 100 MG capsule Take 1 capsule (100 mg total) by mouth at bedtime. (Patient taking differently: Take 300 mg by mouth at bedtime.) 7 capsule 0  . HYDROcodone-acetaminophen (NORCO) 10-325 MG tablet Take 1 tablet by mouth 4 (four) times daily.    . insulin aspart (NOVOLOG) 100 UNIT/ML injection Inject 0-9 Units into the skin 3 (three) times daily with meals. (Patient taking differently: Inject 4-7 Units into the skin 3 (three) times daily with meals.  Sliding scale insulin) 10 mL 11  . Insulin Glargine (BASAGLAR KWIKPEN) 100 UNIT/ML Inject 30 Units into the skin at bedtime. 10 mL 0  . latanoprost (XALATAN) 0.005 % ophthalmic solution Place 1 drop into the right eye 2 (two) times daily.    Marland Kitchen lidocaine-prilocaine (EMLA) cream Apply 1 application topically as needed (topical anesthesia for hemodialysis if Gebauers and Lidocaine injection are ineffective.). (Patient taking differently: Apply 1 application topically daily as needed (dialysis.).) 30 g 0  . omeprazole (PRILOSEC) 40 MG capsule Take 40 mg by mouth daily.    Marland Kitchen PARoxetine (PAXIL) 20 MG tablet Take 1 tablet (20 mg total) by mouth every evening. This is to prevent panic attacks (Patient taking differently: Take 20 mg by mouth at bedtime.) 7 tablet 0  . prednisoLONE acetate (PRED FORTE) 1 % ophthalmic suspension Place 1 drop into the right eye 2 (two) times daily.    . pregabalin (LYRICA) 75 MG capsule Take 75 mg by mouth daily.     Current Facility-Administered Medications  Medication Dose Route Frequency Provider Last Rate Last Admin  . 0.9 %  sodium chloride infusion  250 mL Intravenous PRN Waynetta Sandy, MD      . sodium chloride flush (NS) 0.9 % injection 3 mL  3 mL Intravenous Q12H Waynetta Sandy, MD      . sodium chloride flush (NS) 0.9 % injection 3 mL  3 mL Intravenous PRN Waynetta Sandy, MD        Review of Systems  Constitutional:  Constitutional negative. HENT: HENT negative.  Eyes: Eyes negative.  Cardiovascular: Positive for leg swelling.  GI: Gastrointestinal negative.  Musculoskeletal: Musculoskeletal negative.       Left hand pain Neurological: Neurological negative. Hematologic: Hematologic/lymphatic negative.  Psychiatric: Psychiatric negative.        Objective:  Objective  Vitals:   09/21/20 1556  BP: (!) 151/87  Pulse: 64  Resp: 20  Temp: (!) 97.5 F (36.4 C)  SpO2: 98%    Physical Exam HENT:     Nose:      Comments: Wearing a mask Eyes:     Pupils: Pupils are equal, round, and reactive to light.  Cardiovascular:     Rate and Rhythm: Normal rate.     Pulses:  Radial pulses are 0 on the right side and 0 on the left side.  Abdominal:     General: Abdomen is flat.     Palpations: Abdomen is soft.  Musculoskeletal:        General: Normal range of motion.     Right lower leg: Edema present.     Left lower leg: Edema present.  Skin:    Capillary Refill: Capillary refill takes less than 2 seconds.  Neurological:     General: No focal deficit present.     Mental Status: She is alert.  Psychiatric:        Mood and Affect: Mood normal.        Behavior: Behavior normal.        Thought Content: Thought content normal.        Judgment: Judgment normal.     Data: +--------------+-------------+-------------------+-------------+-----------  ----+   RT Diameter  RT Findings     GSV     LT Diameter  LT  Findings      (cm)                      (cm)             +--------------+-------------+-------------------+-------------+-----------  ----+     0.78           Saphenofemoral    0.43                              Junction                    +--------------+-------------+-------------------+-------------+-----------  ----+     0.36   large branch  Proximal thigh    0.45                    out of fascia                           +--------------+-------------+-------------------+-------------+-----------  ----+     0.43             Mid thigh     0.40             +--------------+-------------+-------------------+-------------+-----------  ----+     0.41            Distal thigh    0.42              +--------------+-------------+-------------------+-------------+-----------  ----+     0.45              Knee      0.42             +--------------+-------------+-------------------+-------------+-----------  ----+     0.35             Prox calf     0.23    large  bran h                                   out of  fascia   +--------------+-------------+-------------------+-------------+-----------  ----+     0.26             Mid calf             branching    +--------------+-------------+-------------------+-------------+-----------  ----+     0.29  Distal calf                   +--------------+-------------+-------------------+-------------+-----------  ----+     0.43              Ankle                     +--------------+-------------+-------------------+-------------+-----------  ----+        Assessment/Plan:     55 year old female with left hand steal from two-stage basilic vein fistula.  She has undergone angiography and I have reviewed the interpretation however cannot see the images.  We will plan for banding of the left arm fistula versus possible dril procedure depending on appearance by duplex at the time of surgery and previous angiogram pictures.  She does have suitable saphenous vein bilaterally that can be used for drill if that procedure is possible.  I discussed the other options being ligation of the fistula with catheter placement versus continued use of the fistula with no intervention.  At this time pain limits the latter option and the former option also would render her catheter dependent given previous steal from the right arm and high risk femoral graft.    Waynetta Sandy MD [facility]

## 2020-09-21 NOTE — Progress Notes (Signed)
Patient ID: Martha [name], female   DOB: [birth year], 55 y.o.   MRN: [id number]  Reason for Consult: No chief complaint on file.   Referred by Emelia Loron, NP  Subjective:     HPI:  Martha [name] is a 55 y.o. female with a two-stage left arm basilic vein fistula.  She does have a previous fistula ligated in her right arm.  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  She is on dialysis Monday Wednesdays and Fridays and she dialyzed earlier today.  She recently underwent angiography she is now here for follow-up to discuss possible intervention.  Past Medical History:  Diagnosis Date  . Acid reflux    takes Tums  . Anemia   . Arthritis   . Bipolar 1 disorder ([place])   . Blindness of left eye    left eye removed  . Carpal tunnel syndrome, bilateral   . Cervical radiculopathy   . CHF (congestive heart failure) ([place])   . Chronic kidney disease    Stage 5- 01/25/17  . Constipation   . COPD (chronic obstructive pulmonary disease) ([place]) 2014   bronchitis  . Degenerative disc disease, thoracic   . Depression   . Diabetes mellitus    Type II  . Diabetic retinopathy ([place])   . Dyspnea    when walking  . End stage kidney disease ([place])    M, W, [facility]  . Gout   . Hypertension   . Noncompliance with medication regimen   . Noncompliance with medication regimen   . Obesity (BMI 30-39.9)   . OSA (obstructive sleep apnea)    cpap  . Panic attack   . RLS (restless legs syndrome)    Family History  Adopted: Yes  Family history unknown: Yes   Past Surgical History:  Procedure Laterality Date  . AORTIC ARCH ANGIOGRAPHY N/A 09/11/2020   Procedure: AORTIC ARCH ANGIOGRAPHY;  Surgeon: Serafina Mitchell, MD;  Location: [place] [facility];  Service: Cardiovascular;  Laterality: N/A;  . AV FISTULA PLACEMENT Right 01/27/2017   Procedure: ARTERIOVENOUS (AV) FISTULA CREATION-RIGHT ARM;  Surgeon: Elam Dutch, MD;   Location[facility];  Service: Vascular;  Laterality: Right;  . AV FISTULA PLACEMENT Left 08/03/2018   Procedure: LEFT ARTERIOVENOUS (AV) FISTULA CREATION;  Surgeon: Elam Dutch, MD;  Location: [place];  Service: Vascular;  Laterality: Left;  . BASCILIC VEIN TRANSPOSITION Left 04/26/2019   Procedure: SECOND STAGE BASILIC VEIN TRANSPOSITION LEFT ARM;  Surgeon: Elam Dutch, MD;  Location: [place];  Service: Vascular;  Laterality: Left;  . BIOPSY N/A 05/17/2013   Procedure: BIOPSY;  Surgeon: [name], MD;  Location: [facility];  Service: Endoscopy;  Laterality: N/A;  . CARPAL TUNNEL RELEASE Left 11/25/2018   Procedure: LEFT CARPAL TUNNEL RELEASE;  Surgeon: Charlotte Crumb, MD;  Location: [place];  Service: Orthopedics;  Laterality: Left;  . CATARACT EXTRACTION Right   . CHOLECYSTECTOMY N/A 04/27/2014   Procedure: LAPAROSCOPIC CHOLECYSTECTOMY;  Surgeon: [name], MD;  Location: [facility];  Service: General;  Laterality: N/A;  . COLONOSCOPY  06/06/2010   [id number] bleeding secondary to internal hemorrhoids but incomplete evaluation secondary to poor right colon prep/small rectal and sigmoid colon polyps (hyperplastic). PROPOFOL  . COLONOSCOPY  May 2013   Dr. Gilliam/[facility]: 5 mm a descending colon polyp, hyperplastic. Adequate bowel prep.  . ENDOMETRIAL ABLATION    .  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 ENUCLEATION Left 02/04/2018   Procedure: ENUCLEATION WITH PLACEMENT OF IMPLANT LEFT EYE;  Surgeon: Clista Bernhardt, MD;  Location: [place];  Service: Ophthalmology;  Laterality: Left;  . ESOPHAGOGASTRODUODENOSCOPY  06/06/2010   [id number] erythema and edema of body of stomach, with sessile polypoid lesions. bx benign. no h.pylori  . ESOPHAGOGASTRODUODENOSCOPY (EGD) WITH PROPOFOL N/A 05/17/2013   Dr. Oneida Alar: normal esophagus, moderate nodular gastritis, negative path, empiric Savary dilation  . EYE SURGERY  07/2017   sx for glaucoma  . FLEXIBLE  SIGMOIDOSCOPY N/A 05/17/2013   Dr. Oneida Alar: moderate sized internal hemorrhoids  . HEMORRHOID BANDING N/A 05/17/2013   Procedure: HEMORRHOID BANDING;  Surgeon: [name], MD;  [facility];  Service: Endoscopy;  Laterality: N/A;  2 bands placed  . INCISION AND DRAINAGE ABSCESS Left 10/11/2013   Procedure: INCISION AND DRAINAGE AND DEBRIDEMENT LEFT BREAST  ABSCESS;  Surgeon: [name], MD;  [facility];  Service: General;  Laterality: Left;  . INSERTION OF DIALYSIS CATHETER N/A 06/08/2018   Procedure: INSERTION OF Right internal jugular TUNNELED  DIALYSIS CATHETER;  Surgeon: Angelia Mould, MD;  Location: [place];  Service: Vascular;  Laterality: N/A;  . IRRIGATION AND DEBRIDEMENT ABSCESS Right 06/01/2013   Procedure: INCISION AND DRAINAGE AND DEBRIDEMENT ABSCESS RIGHT BREAST;  Surgeon: [name], MD;  [facility];  Service: General;  Laterality: Right;  . LEFT EYE REMOVED Left 01/2018   [facility] [address].  Marland Kitchen LIGATION OF ARTERIOVENOUS  FISTULA Right 06/08/2018   Procedure: LIGATION OF ARTERIOVENOUS  FISTULA RIGHT ARM;  Surgeon: Angelia Mould, MD;  Location: [place];  Service: Vascular;  Laterality: Right;  . ORIF ANKLE FRACTURE Right 12/17/2018   Procedure: OPEN REDUCTION INTERNAL FIXATION (ORIF) ANKLE FRACTURE;  Surgeon: Edrick Kins, DPM;  Location: [place];  Service: Podiatry;  Laterality: Right;  . SAVORY DILATION N/A 05/17/2013   Procedure: SAVORY DILATION;  Surgeon: [name], MD;  [facility];  Service: Endoscopy;  Laterality: N/A;  14/15/16  . SKIN FULL THICKNESS GRAFT Left 05/05/2018   Procedure: ABDOMINAL DERMIS FAT SKIN GRAFT FULL THICKNESS LEFT EYE;  Surgeon: Clista Bernhardt, MD;  Location: [place];  Service: Ophthalmology;  Laterality: Left;  . TUBAL LIGATION      Short Social History:  Social History   Tobacco Use  . Smoking status: Former Smoker    Packs/day: 1.50    Years: 25.00    Pack years:  37.50    Types: Cigarettes    Quit date: 10/23/2007    Years since quitting: 12.9  . Smokeless tobacco: Never Used  Substance Use Topics  . Alcohol use: No    Allergies  Allergen Reactions  . Codeine Nausea And Vomiting  . Tape Rash and Other (See Comments)    Pull skin off.  Paper tape is ok    Current Outpatient Medications  Medication Sig Dispense Refill  . albuterol (PROVENTIL) (2.5 MG/3ML) 0.083% nebulizer solution Take 2.5 mg by nebulization every 12 (twelve) hours as needed for wheezing or shortness of breath.    . ALPRAZolam (XANAX) 1 MG tablet Take 1 tablet (1 mg total) by mouth 4 (four) times daily as needed for anxiety. (Patient taking differently: Take 1 mg by mouth 4 (four) times daily.) 9 tablet 0  . Artificial Tear Ointment (DRY EYES OP) Place 1 drop into the right eye 2 (two)  times daily.    . busPIRone (BUSPAR) 7.5 MG tablet Take 1 tablet (7.5 mg total) by mouth 2 (two) times daily. 7 tablet 0  . calcium carbonate (TUMS EX) 750 MG chewable tablet Chew 1-3 tablets by mouth See admin instructions. Take 3 tablets with each meal & take 1 tablets with each snack    . cinacalcet (SENSIPAR) 60 MG tablet Take 60 mg by mouth daily.    . dorzolamide-timolol (COSOPT) 22.3-6.8 MG/ML ophthalmic solution Place 1 drop into the right eye in the morning and at bedtime.    Marland Kitchen doxercalciferol (HECTOROL) 4 MCG/2ML injection Inject 0.5 mLs (1 mcg total) into the vein every Monday, Wednesday, and Friday with hemodialysis. 2 mL   . gabapentin (NEURONTIN) 100 MG capsule Take 1 capsule (100 mg total) by mouth at bedtime. (Patient taking differently: Take 300 mg by mouth at bedtime.) 7 capsule 0  . HYDROcodone-acetaminophen (NORCO) 10-325 MG tablet Take 1 tablet by mouth 4 (four) times daily.    . insulin aspart (NOVOLOG) 100 UNIT/ML injection Inject 0-9 Units into the skin 3 (three) times daily with meals. (Patient taking differently: Inject 4-7 Units into the skin 3 (three) times daily with meals.  Sliding scale insulin) 10 mL 11  . Insulin Glargine (BASAGLAR KWIKPEN) 100 UNIT/ML Inject 30 Units into the skin at bedtime. 10 mL 0  . latanoprost (XALATAN) 0.005 % ophthalmic solution Place 1 drop into the right eye 2 (two) times daily.    Marland Kitchen lidocaine-prilocaine (EMLA) cream Apply 1 application topically as needed (topical anesthesia for hemodialysis if Gebauers and Lidocaine injection are ineffective.). (Patient taking differently: Apply 1 application topically daily as needed (dialysis.).) 30 g 0  . omeprazole (PRILOSEC) 40 MG capsule Take 40 mg by mouth daily.    Marland Kitchen PARoxetine (PAXIL) 20 MG tablet Take 1 tablet (20 mg total) by mouth every evening. This is to prevent panic attacks (Patient taking differently: Take 20 mg by mouth at bedtime.) 7 tablet 0  . prednisoLONE acetate (PRED FORTE) 1 % ophthalmic suspension Place 1 drop into the right eye 2 (two) times daily.    . pregabalin (LYRICA) 75 MG capsule Take 75 mg by mouth daily.     Current Facility-Administered Medications  Medication Dose Route Frequency Provider Last Rate Last Admin  . 0.9 %  sodium chloride infusion  250 mL Intravenous PRN Waynetta Sandy, MD      . sodium chloride flush (NS) 0.9 % injection 3 mL  3 mL Intravenous Q12H Waynetta Sandy, MD      . sodium chloride flush (NS) 0.9 % injection 3 mL  3 mL Intravenous PRN Waynetta Sandy, MD        Review of Systems  Constitutional:  Constitutional negative. HENT: HENT negative.  Eyes: Eyes negative.  Cardiovascular: Positive for leg swelling.  GI: Gastrointestinal negative.  Musculoskeletal: Musculoskeletal negative.       Left hand pain Neurological: Neurological negative. Hematologic: Hematologic/lymphatic negative.  Psychiatric: Psychiatric negative.        Objective:  Objective  Vitals:   09/21/20 1556  BP: (!) 151/87  Pulse: 64  Resp: 20  Temp: (!) 97.5 F (36.4 C)  SpO2: 98%    Physical Exam HENT:     Nose:      Comments: Wearing a mask Eyes:     Pupils: Pupils are equal, round, and reactive to light.  Cardiovascular:     Rate and Rhythm: Normal rate.     Pulses:  Radial pulses are 0 on the right side and 0 on the left side.  Abdominal:     General: Abdomen is flat.     Palpations: Abdomen is soft.  Musculoskeletal:        General: Normal range of motion.     Right lower leg: Edema present.     Left lower leg: Edema present.  Skin:    Capillary Refill: Capillary refill takes less than 2 seconds.  Neurological:     General: No focal deficit present.     Mental Status: She is alert.  Psychiatric:        Mood and Affect: Mood normal.        Behavior: Behavior normal.        Thought Content: Thought content normal.        Judgment: Judgment normal.     Data: +--------------+-------------+-------------------+-------------+-----------  ----+   RT Diameter  RT Findings     GSV     LT Diameter  LT  Findings      (cm)                      (cm)             +--------------+-------------+-------------------+-------------+-----------  ----+     0.78           Saphenofemoral    0.43                              Junction                    +--------------+-------------+-------------------+-------------+-----------  ----+     0.36   large branch  Proximal thigh    0.45                    out of fascia                           +--------------+-------------+-------------------+-------------+-----------  ----+     0.43             Mid thigh     0.40             +--------------+-------------+-------------------+-------------+-----------  ----+     0.41            Distal thigh    0.42              +--------------+-------------+-------------------+-------------+-----------  ----+     0.45              Knee      0.42             +--------------+-------------+-------------------+-------------+-----------  ----+     0.35             Prox calf     0.23    large  bran h                                   out of  fascia   +--------------+-------------+-------------------+-------------+-----------  ----+     0.26             Mid calf             branching    +--------------+-------------+-------------------+-------------+-----------  ----+     0.29  Distal calf                   +--------------+-------------+-------------------+-------------+-----------  ----+     0.43              Ankle                     +--------------+-------------+-------------------+-------------+-----------  ----+        Assessment/Plan:     55 year old female with left hand steal from two-stage basilic vein fistula.  She has undergone angiography and I have reviewed the interpretation however cannot see the images.  We will plan for banding of the left arm fistula versus possible dril procedure depending on appearance by duplex at the time of surgery and previous angiogram pictures.  She does have suitable saphenous vein bilaterally that can be used for drill if that procedure is possible.  I discussed the other options being ligation of the fistula with catheter placement versus continued use of the fistula with no intervention.  At this time pain limits the latter option and the former option also would render her catheter dependent given previous steal from the right arm and high risk femoral graft.    Waynetta Sandy MD [facility]

## 2020-09-25 ENCOUNTER — Other Ambulatory Visit: Payer: Self-pay

## 2020-09-25 ENCOUNTER — Other Ambulatory Visit
Admission: RE | Admit: 2020-09-25 | Discharge: 2020-09-25 | Disposition: A | Discharge status: Home / Self Care | Payer: Medicaid Other | Source: Ambulatory Visit | Attending: Vascular Surgery | Admitting: Vascular Surgery

## 2020-09-25 ENCOUNTER — Encounter (HOSPITAL_COMMUNITY): Payer: Self-pay | Admitting: Vascular Surgery

## 2020-09-25 DIAGNOSIS — Z20822 Contact with and (suspected) exposure to covid-19: Secondary | ICD-10-CM | POA: Diagnosis not present

## 2020-09-25 DIAGNOSIS — Z01812 Encounter for preprocedural laboratory examination: Secondary | ICD-10-CM | POA: Insufficient documentation

## 2020-09-25 NOTE — Progress Notes (Signed)
Spoke with pt for pre-op call. Pt denies cardiac history, states she has had CHF in the past. No recent chest pain or sob. Pt is a type 2 Diabetic. Last A1C was 6.0 on 07/05/20. Pt states she has not been checking her blood sugar due to having difficulty seeing the meter. She states she will check it in the AM. Instructed her take 1/2 of her regular dose of Basaglar insulin tonight, she will take 15 units. Instructed her to check her blood sugar in the AM when she gets up and every 2 hours until she leaves for the hospital. If blood sugar is >220 take 1/2 of usual correction dose of Novolog insulin. If blood sugar is 70 or below, treat with 1/2 cup of clear juice (apple or cranberry) and recheck blood sugar 15 minutes after drinking juice. If blood sugar continues to be 70 or below, call the Short Stay department and ask to speak to a nurse. Pt voiced understanding.   Covid test done today, result pending. Pt states she's been in quarantine since the test was done and understands that she stays in quarantine until she comes to the hospital tomorrow.

## 2020-09-26 ENCOUNTER — Inpatient Hospital Stay (HOSPITAL_COMMUNITY): Payer: Medicaid Other | Admitting: Anesthesiology

## 2020-09-26 ENCOUNTER — Encounter (HOSPITAL_COMMUNITY)
Admission: RE | Disposition: A | Discharge status: Home / Self Care | Payer: Self-pay | Source: Home / Self Care | Attending: Vascular Surgery

## 2020-09-26 ENCOUNTER — Encounter (HOSPITAL_COMMUNITY): Payer: Self-pay | Admitting: Vascular Surgery

## 2020-09-26 ENCOUNTER — Inpatient Hospital Stay (HOSPITAL_COMMUNITY)
Admission: RE | Admit: 2020-09-26 | Discharge: 2020-09-27 | DRG: 252 | Disposition: A | Discharge status: Home / Self Care | Payer: Medicaid Other | Attending: Vascular Surgery | Admitting: Vascular Surgery

## 2020-09-26 ENCOUNTER — Other Ambulatory Visit: Payer: Self-pay

## 2020-09-26 DIAGNOSIS — F319 Bipolar disorder, unspecified: Secondary | ICD-10-CM | POA: Diagnosis present

## 2020-09-26 DIAGNOSIS — I132 Hypertensive heart and chronic kidney disease with heart failure and with stage 5 chronic kidney disease, or end stage renal disease: Secondary | ICD-10-CM | POA: Diagnosis present

## 2020-09-26 DIAGNOSIS — K219 Gastro-esophageal reflux disease without esophagitis: Secondary | ICD-10-CM | POA: Diagnosis present

## 2020-09-26 DIAGNOSIS — Z992 Dependence on renal dialysis: Secondary | ICD-10-CM

## 2020-09-26 DIAGNOSIS — G4733 Obstructive sleep apnea (adult) (pediatric): Secondary | ICD-10-CM | POA: Diagnosis present

## 2020-09-26 DIAGNOSIS — E1122 Type 2 diabetes mellitus with diabetic chronic kidney disease: Secondary | ICD-10-CM | POA: Diagnosis present

## 2020-09-26 DIAGNOSIS — F410 Panic disorder [episodic paroxysmal anxiety] without agoraphobia: Secondary | ICD-10-CM | POA: Diagnosis present

## 2020-09-26 DIAGNOSIS — H5462 Unqualified visual loss, left eye, normal vision right eye: Secondary | ICD-10-CM | POA: Diagnosis present

## 2020-09-26 DIAGNOSIS — G2581 Restless legs syndrome: Secondary | ICD-10-CM | POA: Diagnosis present

## 2020-09-26 DIAGNOSIS — Z794 Long term (current) use of insulin: Secondary | ICD-10-CM | POA: Diagnosis not present

## 2020-09-26 DIAGNOSIS — E11319 Type 2 diabetes mellitus with unspecified diabetic retinopathy without macular edema: Secondary | ICD-10-CM | POA: Diagnosis present

## 2020-09-26 DIAGNOSIS — Z79891 Long term (current) use of opiate analgesic: Secondary | ICD-10-CM | POA: Diagnosis not present

## 2020-09-26 DIAGNOSIS — Z79899 Other long term (current) drug therapy: Secondary | ICD-10-CM

## 2020-09-26 DIAGNOSIS — Z9049 Acquired absence of other specified parts of digestive tract: Secondary | ICD-10-CM | POA: Diagnosis not present

## 2020-09-26 DIAGNOSIS — T82898A Other specified complication of vascular prosthetic devices, implants and grafts, initial encounter: Secondary | ICD-10-CM | POA: Diagnosis not present

## 2020-09-26 DIAGNOSIS — Y832 Surgical operation with anastomosis, bypass or graft as the cause of abnormal reaction of the patient, or of later complication, without mention of misadventure at the time of the procedure: Secondary | ICD-10-CM | POA: Diagnosis present

## 2020-09-26 DIAGNOSIS — J449 Chronic obstructive pulmonary disease, unspecified: Secondary | ICD-10-CM | POA: Diagnosis present

## 2020-09-26 DIAGNOSIS — Z888 Allergy status to other drugs, medicaments and biological substances status: Secondary | ICD-10-CM

## 2020-09-26 DIAGNOSIS — N186 End stage renal disease: Secondary | ICD-10-CM | POA: Diagnosis present

## 2020-09-26 DIAGNOSIS — Z87891 Personal history of nicotine dependence: Secondary | ICD-10-CM

## 2020-09-26 DIAGNOSIS — Z9001 Acquired absence of eye: Secondary | ICD-10-CM

## 2020-09-26 DIAGNOSIS — I509 Heart failure, unspecified: Secondary | ICD-10-CM | POA: Diagnosis present

## 2020-09-26 DIAGNOSIS — Z885 Allergy status to narcotic agent status: Secondary | ICD-10-CM

## 2020-09-26 HISTORY — PX: VEIN HARVEST: SHX6363

## 2020-09-26 HISTORY — PX: REVISON OF ARTERIOVENOUS FISTULA: SHX6074

## 2020-09-26 LAB — POCT I-STAT, CHEM 8
BUN: 39 mg/dL — ABNORMAL HIGH (ref 6–20)
Calcium, Ion: 0.86 mmol/L — CL (ref 1.15–1.40)
Chloride: 102 mmol/L (ref 98–111)
Creatinine, Ser: 9 mg/dL — ABNORMAL HIGH (ref 0.44–1.00)
Glucose, Bld: 148 mg/dL — ABNORMAL HIGH (ref 70–99)
HCT: 30 % — ABNORMAL LOW (ref 36.0–46.0)
Hemoglobin: 10.2 g/dL — ABNORMAL LOW (ref 12.0–15.0)
Potassium: 5.3 mmol/L — ABNORMAL HIGH (ref 3.5–5.1)
Sodium: 139 mmol/L (ref 135–145)
TCO2: 24 mmol/L (ref 22–32)

## 2020-09-26 LAB — SARS CORONAVIRUS 2 (TAT 6-24 HRS): SARS Coronavirus 2: NEGATIVE

## 2020-09-26 LAB — CBC
HCT: 31.6 % — ABNORMAL LOW (ref 36.0–46.0)
Hemoglobin: 9.7 g/dL — ABNORMAL LOW (ref 12.0–15.0)
MCH: 29.2 pg (ref 26.0–34.0)
MCHC: 30.7 g/dL (ref 30.0–36.0)
MCV: 95.2 fL (ref 80.0–100.0)
Platelets: 173 10*3/uL (ref 150–400)
RBC: 3.32 MIL/uL — ABNORMAL LOW (ref 3.87–5.11)
RDW: 16.9 % — ABNORMAL HIGH (ref 11.5–15.5)
WBC: 10 10*3/uL (ref 4.0–10.5)
nRBC: 0 % (ref 0.0–0.2)

## 2020-09-26 LAB — GLUCOSE, CAPILLARY
Glucose-Capillary: 146 mg/dL — ABNORMAL HIGH (ref 70–99)
Glucose-Capillary: 130 mg/dL — ABNORMAL HIGH (ref 70–99)
Glucose-Capillary: 257 mg/dL — ABNORMAL HIGH (ref 70–99)

## 2020-09-26 LAB — HEMOGLOBIN A1C
Hgb A1c MFr Bld: 5 % (ref 4.8–5.6)
Mean Plasma Glucose: 96.8 mg/dL

## 2020-09-26 LAB — CREATININE, SERUM
Creatinine, Ser: 8.82 mg/dL — ABNORMAL HIGH (ref 0.44–1.00)
GFR, Estimated: 5 mL/min — ABNORMAL LOW (ref >60)

## 2020-09-26 SURGERY — REVISON OF ARTERIOVENOUS FISTULA
Anesthesia: General | Laterality: Left

## 2020-09-26 MED ORDER — INSULIN GLARGINE 100 UNIT/ML ~~LOC~~ SOLN
30.0000 [IU] | Freq: Every day | SUBCUTANEOUS | Status: DC
  Administered 2020-09-26: 30 [IU] via SUBCUTANEOUS
  Filled 2020-09-26 (×2): qty 0.3

## 2020-09-26 MED ORDER — PROPOFOL 10 MG/ML IV BOLUS
INTRAVENOUS | Status: DC | PRN
  Administered 2020-09-26: 200 mg via INTRAVENOUS

## 2020-09-26 MED ORDER — LATANOPROST 0.005 % OP SOLN
1.0000 [drp] | Freq: Two times a day (BID) | OPHTHALMIC | Status: DC
  Administered 2020-09-27 (×2): 1 [drp] via OPHTHALMIC
  Filled 2020-09-26: qty 2.5

## 2020-09-26 MED ORDER — 0.9 % SODIUM CHLORIDE (POUR BTL) OPTIME
TOPICAL | Status: DC | PRN
  Administered 2020-09-26: 2000 mL

## 2020-09-26 MED ORDER — BUSPIRONE HCL 5 MG PO TABS
7.5000 mg | ORAL_TABLET | Freq: Two times a day (BID) | ORAL | Status: DC
  Administered 2020-09-26 – 2020-09-27 (×2): 7.5 mg via ORAL
  Filled 2020-09-26: qty 2

## 2020-09-26 MED ORDER — SODIUM CHLORIDE 0.9 % IV SOLN: INTRAVENOUS | Status: DC | PRN

## 2020-09-26 MED ORDER — FENTANYL CITRATE (PF) 250 MCG/5ML IJ SOLN
INTRAMUSCULAR | Status: AC
  Filled 2020-09-26: qty 5

## 2020-09-26 MED ORDER — CHLORHEXIDINE GLUCONATE 4 % EX LIQD: 60.0000 mL | Freq: Once | CUTANEOUS | Status: DC

## 2020-09-26 MED ORDER — ALUM & MAG HYDROXIDE-SIMETH 200-200-20 MG/5ML PO SUSP: 15.0000 mL | ORAL | Status: DC | PRN

## 2020-09-26 MED ORDER — SODIUM CHLORIDE 0.9% FLUSH: 3.0000 mL | INTRAVENOUS | Status: DC | PRN

## 2020-09-26 MED ORDER — DEXTROSE 5 % IV SOLN
3.0000 g | INTRAVENOUS | Status: AC
  Administered 2020-09-26: 2 g via INTRAVENOUS
  Filled 2020-09-26: qty 3

## 2020-09-26 MED ORDER — PHENOL 1.4 % MT LIQD: 1.0000 | OROMUCOSAL | Status: DC | PRN

## 2020-09-26 MED ORDER — CEFAZOLIN SODIUM-DEXTROSE 2-4 GM/100ML-% IV SOLN
INTRAVENOUS | Status: AC
  Filled 2020-09-26: qty 100

## 2020-09-26 MED ORDER — SODIUM ZIRCONIUM CYCLOSILICATE 10 G PO PACK
10.0000 g | PACK | Freq: Once | ORAL | Status: AC
  Administered 2020-09-26: 10 g via ORAL
  Filled 2020-09-26: qty 1

## 2020-09-26 MED ORDER — MIDAZOLAM HCL 2 MG/2ML IJ SOLN
INTRAMUSCULAR | Status: AC
  Filled 2020-09-26: qty 2

## 2020-09-26 MED ORDER — OXYCODONE HCL 5 MG PO TABS
ORAL_TABLET | ORAL | Status: AC
  Administered 2020-09-26: 5 mg
  Filled 2020-09-26: qty 1

## 2020-09-26 MED ORDER — PAROXETINE HCL 20 MG PO TABS
20.0000 mg | ORAL_TABLET | Freq: Every day | ORAL | Status: DC
  Administered 2020-09-26: 20 mg via ORAL
  Filled 2020-09-26: qty 1

## 2020-09-26 MED ORDER — PAPAVERINE HCL 30 MG/ML IJ SOLN
INTRAMUSCULAR | Status: AC
  Filled 2020-09-26: qty 2

## 2020-09-26 MED ORDER — SODIUM CHLORIDE 0.9 % IV SOLN
INTRAVENOUS | Status: DC | PRN
  Administered 2020-09-26: 500 mL

## 2020-09-26 MED ORDER — SODIUM CHLORIDE 0.9 % IV SOLN
INTRAVENOUS | Status: AC
  Filled 2020-09-26: qty 1.2

## 2020-09-26 MED ORDER — GUAIFENESIN-DM 100-10 MG/5ML PO SYRP: 15.0000 mL | ORAL_SOLUTION | ORAL | Status: DC | PRN

## 2020-09-26 MED ORDER — FENTANYL CITRATE (PF) 100 MCG/2ML IJ SOLN
INTRAMUSCULAR | Status: AC
  Administered 2020-09-26: 25 ug via INTRAVENOUS
  Filled 2020-09-26: qty 2

## 2020-09-26 MED ORDER — ALBUTEROL SULFATE (2.5 MG/3ML) 0.083% IN NEBU: 2.5000 mg | INHALATION_SOLUTION | Freq: Two times a day (BID) | RESPIRATORY_TRACT | Status: DC | PRN

## 2020-09-26 MED ORDER — PREDNISOLONE ACETATE 1 % OP SUSP
1.0000 [drp] | Freq: Two times a day (BID) | OPHTHALMIC | Status: DC
  Administered 2020-09-27 (×2): 1 [drp] via OPHTHALMIC
  Filled 2020-09-26: qty 5

## 2020-09-26 MED ORDER — HEMOSTATIC AGENTS (NO CHARGE) OPTIME
TOPICAL | Status: DC | PRN
  Administered 2020-09-26: 1 via TOPICAL

## 2020-09-26 MED ORDER — PROMETHAZINE HCL 25 MG/ML IJ SOLN: 6.2500 mg | INTRAMUSCULAR | Status: DC | PRN

## 2020-09-26 MED ORDER — DORZOLAMIDE HCL-TIMOLOL MAL 2-0.5 % OP SOLN
1.0000 [drp] | Freq: Two times a day (BID) | OPHTHALMIC | Status: DC
  Administered 2020-09-27 (×2): 1 [drp] via OPHTHALMIC
  Filled 2020-09-26: qty 10

## 2020-09-26 MED ORDER — POLYETHYLENE GLYCOL 3350 17 G PO PACK: 17.0000 g | PACK | Freq: Every day | ORAL | Status: DC | PRN

## 2020-09-26 MED ORDER — POTASSIUM CHLORIDE CRYS ER 20 MEQ PO TBCR: 20.0000 meq | EXTENDED_RELEASE_TABLET | Freq: Once | ORAL | Status: DC

## 2020-09-26 MED ORDER — SODIUM CHLORIDE 0.9 % IV SOLN: INTRAVENOUS | Status: DC

## 2020-09-26 MED ORDER — AMISULPRIDE (ANTIEMETIC) 5 MG/2ML IV SOLN
INTRAVENOUS | Status: AC
  Administered 2020-09-26: 10 mg via INTRAVENOUS
  Filled 2020-09-26: qty 4

## 2020-09-26 MED ORDER — MIDAZOLAM HCL 5 MG/5ML IJ SOLN
INTRAMUSCULAR | Status: DC | PRN
  Administered 2020-09-26: 2 mg via INTRAVENOUS

## 2020-09-26 MED ORDER — PROPOFOL 10 MG/ML IV BOLUS
INTRAVENOUS | Status: AC
  Filled 2020-09-26: qty 20

## 2020-09-26 MED ORDER — METOPROLOL TARTRATE 5 MG/5ML IV SOLN
2.0000 mg | INTRAVENOUS | Status: DC | PRN
Start: 2020-09-26 — End: 2020-09-27

## 2020-09-26 MED ORDER — CINACALCET HCL 30 MG PO TABS
60.0000 mg | ORAL_TABLET | Freq: Every day | ORAL | Status: DC
  Administered 2020-09-27: 60 mg via ORAL
  Filled 2020-09-26: qty 2

## 2020-09-26 MED ORDER — CHLORHEXIDINE GLUCONATE 0.12 % MT SOLN
OROMUCOSAL | Status: AC
  Administered 2020-09-26: 15 mL
  Filled 2020-09-26: qty 15

## 2020-09-26 MED ORDER — SODIUM CHLORIDE 0.9% FLUSH
3.0000 mL | Freq: Two times a day (BID) | INTRAVENOUS | Status: DC
  Administered 2020-09-27: 3 mL via INTRAVENOUS

## 2020-09-26 MED ORDER — AMISULPRIDE (ANTIEMETIC) 5 MG/2ML IV SOLN: 10.0000 mg | Freq: Once | INTRAVENOUS | Status: AC | PRN

## 2020-09-26 MED ORDER — ACETAMINOPHEN 325 MG PO TABS: 325.0000 mg | ORAL_TABLET | ORAL | Status: DC | PRN

## 2020-09-26 MED ORDER — HEPARIN SODIUM (PORCINE) 1000 UNIT/ML IJ SOLN
INTRAMUSCULAR | Status: DC | PRN
  Administered 2020-09-26: 8000 [IU] via INTRAVENOUS

## 2020-09-26 MED ORDER — GABAPENTIN 300 MG PO CAPS
300.0000 mg | ORAL_CAPSULE | Freq: Every day | ORAL | Status: DC
  Administered 2020-09-26: 300 mg via ORAL
  Filled 2020-09-26: qty 1

## 2020-09-26 MED ORDER — PREGABALIN 75 MG PO CAPS
75.0000 mg | ORAL_CAPSULE | Freq: Every day | ORAL | Status: DC
  Administered 2020-09-27: 75 mg via ORAL
  Filled 2020-09-26: qty 1

## 2020-09-26 MED ORDER — PROTAMINE SULFATE 10 MG/ML IV SOLN
INTRAVENOUS | Status: DC | PRN
  Administered 2020-09-26: 10 mg via INTRAVENOUS
  Administered 2020-09-26: 5 mg via INTRAVENOUS
  Administered 2020-09-26: 10 mg via INTRAVENOUS

## 2020-09-26 MED ORDER — CALCIUM CARBONATE ANTACID 500 MG PO CHEW: 1.0000 | CHEWABLE_TABLET | ORAL | Status: DC

## 2020-09-26 MED ORDER — HYDROMORPHONE HCL 1 MG/ML IJ SOLN
0.5000 mg | INTRAMUSCULAR | Status: DC | PRN
Start: 2020-09-26 — End: 2020-09-27

## 2020-09-26 MED ORDER — PANTOPRAZOLE SODIUM 40 MG PO TBEC
40.0000 mg | DELAYED_RELEASE_TABLET | Freq: Every day | ORAL | Status: DC
  Administered 2020-09-27: 40 mg via ORAL
  Filled 2020-09-26: qty 1

## 2020-09-26 MED ORDER — ACETAMINOPHEN 10 MG/ML IV SOLN
INTRAVENOUS | Status: AC
  Administered 2020-09-26: 1000 mg via INTRAVENOUS
  Filled 2020-09-26: qty 100

## 2020-09-26 MED ORDER — LABETALOL HCL 5 MG/ML IV SOLN: 10.0000 mg | INTRAVENOUS | Status: DC | PRN

## 2020-09-26 MED ORDER — LIDOCAINE-PRILOCAINE 2.5-2.5 % EX CREA
1.0000 "application " | TOPICAL_CREAM | Freq: Every day | CUTANEOUS | Status: DC | PRN
  Filled 2020-09-26: qty 5

## 2020-09-26 MED ORDER — ACETAMINOPHEN 650 MG RE SUPP: 325.0000 mg | RECTAL | Status: DC | PRN

## 2020-09-26 MED ORDER — INSULIN ASPART 100 UNIT/ML ~~LOC~~ SOLN: 4.0000 [IU] | Freq: Three times a day (TID) | SUBCUTANEOUS | Status: DC

## 2020-09-26 MED ORDER — ONDANSETRON HCL 4 MG/2ML IJ SOLN
INTRAMUSCULAR | Status: DC | PRN
  Administered 2020-09-26: 4 mg via INTRAVENOUS

## 2020-09-26 MED ORDER — ACETAMINOPHEN 10 MG/ML IV SOLN: 1000.0000 mg | Freq: Once | INTRAVENOUS | Status: DC | PRN

## 2020-09-26 MED ORDER — ONDANSETRON HCL 4 MG/2ML IJ SOLN: 4.0000 mg | Freq: Four times a day (QID) | INTRAMUSCULAR | Status: DC | PRN

## 2020-09-26 MED ORDER — BISACODYL 10 MG RE SUPP: 10.0000 mg | Freq: Every day | RECTAL | Status: DC | PRN

## 2020-09-26 MED ORDER — SODIUM CHLORIDE 0.9 % IV SOLN: 250.0000 mL | INTRAVENOUS | Status: DC | PRN

## 2020-09-26 MED ORDER — FENTANYL CITRATE (PF) 100 MCG/2ML IJ SOLN
INTRAMUSCULAR | Status: DC | PRN
  Administered 2020-09-26 (×2): 25 ug via INTRAVENOUS
  Administered 2020-09-26 (×4): 50 ug via INTRAVENOUS

## 2020-09-26 MED ORDER — FENTANYL CITRATE (PF) 100 MCG/2ML IJ SOLN
INTRAMUSCULAR | Status: AC
  Administered 2020-09-26: 50 ug via INTRAVENOUS
  Filled 2020-09-26: qty 2

## 2020-09-26 MED ORDER — CALCIUM CARBONATE ANTACID 500 MG PO CHEW: 3.0000 | CHEWABLE_TABLET | Freq: Three times a day (TID) | ORAL | Status: DC

## 2020-09-26 MED ORDER — HYDROCODONE-ACETAMINOPHEN 10-325 MG PO TABS
1.0000 | ORAL_TABLET | Freq: Four times a day (QID) | ORAL | Status: DC
  Administered 2020-09-26 – 2020-09-27 (×3): 1 via ORAL
  Filled 2020-09-26 (×3): qty 1

## 2020-09-26 MED ORDER — DOXERCALCIFEROL 4 MCG/2ML IV SOLN: 1.0000 ug | INTRAVENOUS | Status: DC

## 2020-09-26 MED ORDER — DEXAMETHASONE SODIUM PHOSPHATE 4 MG/ML IJ SOLN
INTRAMUSCULAR | Status: DC | PRN
  Administered 2020-09-26: 4 mg via INTRAVENOUS

## 2020-09-26 MED ORDER — INSULIN ASPART 100 UNIT/ML ~~LOC~~ SOLN: 0.0000 [IU] | Freq: Three times a day (TID) | SUBCUTANEOUS | Status: DC

## 2020-09-26 MED ORDER — FENTANYL CITRATE (PF) 100 MCG/2ML IJ SOLN
25.0000 ug | INTRAMUSCULAR | Status: DC | PRN
  Administered 2020-09-26: 50 ug via INTRAVENOUS

## 2020-09-26 MED ORDER — ALPRAZOLAM 0.5 MG PO TABS
1.0000 mg | ORAL_TABLET | Freq: Four times a day (QID) | ORAL | Status: DC
  Administered 2020-09-26 – 2020-09-27 (×3): 1 mg via ORAL
  Filled 2020-09-26 (×3): qty 2

## 2020-09-26 MED ORDER — LIDOCAINE HCL (CARDIAC) PF 100 MG/5ML IV SOSY
PREFILLED_SYRINGE | INTRAVENOUS | Status: DC | PRN
  Administered 2020-09-26: 60 mg via INTRATRACHEAL

## 2020-09-26 MED ORDER — HYDRALAZINE HCL 20 MG/ML IJ SOLN
5.0000 mg | INTRAMUSCULAR | Status: DC | PRN
Start: 2020-09-26 — End: 2020-09-27

## 2020-09-26 MED ORDER — HEPARIN SODIUM (PORCINE) 5000 UNIT/ML IJ SOLN: 5000.0000 [IU] | Freq: Three times a day (TID) | INTRAMUSCULAR | Status: DC

## 2020-09-26 SURGICAL SUPPLY — 37 items
ARMBAND PINK RESTRICT EXTREMIT (MISCELLANEOUS) ×2 IMPLANT
CANISTER SUCT 3000ML PPV (MISCELLANEOUS) ×2 IMPLANT
CATH EMB 3FR 40CM (CATHETERS) ×2 IMPLANT
CLIP VESOCCLUDE MED 6/CT (CLIP) IMPLANT
CLIP VESOCCLUDE SM WIDE 6/CT (CLIP) IMPLANT
COVER PROBE W GEL 5X96 (DRAPES) ×2 IMPLANT
COVER WAND RF STERILE (DRAPES) ×2 IMPLANT
DERMABOND ADVANCED (GAUZE/BANDAGES/DRESSINGS) ×2
DERMABOND ADVANCED .7 DNX12 (GAUZE/BANDAGES/DRESSINGS) ×2 IMPLANT
ELECT CAUTERY BLADE 6.4 (BLADE) ×6 IMPLANT
ELECT REM PT RETURN 9FT ADLT (ELECTROSURGICAL) ×2
ELECTRODE REM PT RTRN 9FT ADLT (ELECTROSURGICAL) ×1 IMPLANT
GLOVE BIO SURGEON STRL SZ7.5 (GLOVE) ×2 IMPLANT
GLOVE SURG UNDER POLY LF SZ6.5 (GLOVE) ×6 IMPLANT
GLOVE SURG UNDER POLY LF SZ7.5 (GLOVE) ×2 IMPLANT
GOWN STRL REUS W/ TWL LRG LVL3 (GOWN DISPOSABLE) ×2 IMPLANT
GOWN STRL REUS W/ TWL XL LVL3 (GOWN DISPOSABLE) ×1 IMPLANT
GOWN STRL REUS W/TWL LRG LVL3 (GOWN DISPOSABLE) ×4
GOWN STRL REUS W/TWL XL LVL3 (GOWN DISPOSABLE) ×2
KIT BASIN OR (CUSTOM PROCEDURE TRAY) ×2 IMPLANT
KIT TURNOVER KIT B (KITS) ×2 IMPLANT
LOOP VESSEL MINI RED (MISCELLANEOUS) ×2 IMPLANT
NS IRRIG 1000ML POUR BTL (IV SOLUTION) ×2 IMPLANT
PACK CV ACCESS (CUSTOM PROCEDURE TRAY) ×2 IMPLANT
PACK UNIVERSAL I (CUSTOM PROCEDURE TRAY) ×2 IMPLANT
PAD ARMBOARD 7.5X6 YLW CONV (MISCELLANEOUS) ×4 IMPLANT
SPONGE LAP 18X18 RF (DISPOSABLE) ×4 IMPLANT
SUT MNCRL AB 4-0 PS2 18 (SUTURE) ×10 IMPLANT
SUT PROLENE 6 0 BV (SUTURE) ×8 IMPLANT
SUT VIC AB 2-0 CT1 27 (SUTURE) ×8
SUT VIC AB 2-0 CT1 TAPERPNT 27 (SUTURE) ×4 IMPLANT
SUT VIC AB 3-0 SH 27 (SUTURE) ×6
SUT VIC AB 3-0 SH 27X BRD (SUTURE) ×3 IMPLANT
TAPE UMBILICAL COTTON 1/8X30 (MISCELLANEOUS) ×2 IMPLANT
TOWEL GREEN STERILE (TOWEL DISPOSABLE) ×4 IMPLANT
UNDERPAD 30X36 HEAVY ABSORB (UNDERPADS AND DIAPERS) ×4 IMPLANT
WATER STERILE IRR 1000ML POUR (IV SOLUTION) ×2 IMPLANT

## 2020-09-26 NOTE — Interval H&P Note (Signed)
History and Physical Interval Note:  09/26/2020 8:28 AM  Martha [name]  has presented today for surgery, with the diagnosis of ESRD.  The various methods of treatment have been discussed with the patient and family. After consideration of risks, benefits and other options for treatment, the patient has consented to  Procedure(s): LEFT ARTERIOVENOUS FISTULA BANDING VS DRIL (Left) as a surgical intervention.  The patient's history has been reviewed, patient examined, no change in status, stable for surgery.  I have reviewed the patient's chart and labs.  Questions were answered to the patient's satisfaction.     Servando Snare

## 2020-09-26 NOTE — Anesthesia Procedure Notes (Signed)
Procedure Name: LMA Insertion Date/Time: 09/26/2020 10:30 AM Performed by: Oletta Lamas, CRNA Pre-anesthesia Checklist: Patient identified, Emergency Drugs available, Suction available and Patient being monitored Patient Re-evaluated:Patient Re-evaluated prior to induction Oxygen Delivery Method: Circle System Utilized Preoxygenation: Pre-oxygenation with 100% oxygen Induction Type: IV induction Ventilation: Mask ventilation without difficulty LMA: LMA inserted LMA Size: 4.0 Number of attempts: 1 Placement Confirmation: positive ETCO2 Tube secured with: Tape Dental Injury: Teeth and Oropharynx as per pre-operative assessment

## 2020-09-26 NOTE — Anesthesia Preprocedure Evaluation (Signed)
Anesthesia Evaluation  Patient identified by MRN, date of birth, ID band Patient awake    Reviewed: Allergy & Precautions, NPO status , Patient's Chart, lab work & pertinent test results  Airway Mallampati: II  TM Distance: >3 FB Neck ROM: Full    Dental  (+) Chipped, Poor Dentition   Pulmonary sleep apnea and Continuous Positive Airway Pressure Ventilation , COPD,  COPD inhaler, former smoker,    Pulmonary exam normal        Cardiovascular hypertension, Pt. on medications +CHF   Rhythm:Regular Rate:Normal     Neuro/Psych Anxiety Depression Bipolar Disorder    GI/Hepatic Neg liver ROS, GERD  Medicated,  Endo/Other  diabetes, Poorly Controlled, Type 2, Insulin Dependent  Renal/GU ESRF and DialysisRenal disease  negative genitourinary   Musculoskeletal  (+) Arthritis , Osteoarthritis,    Abdominal (+)  Abdomen: soft. Bowel sounds: normal.  Peds  Hematology  (+) anemia ,   Anesthesia Other Findings   Reproductive/Obstetrics                             Anesthesia Physical Anesthesia Plan  ASA: III  Anesthesia Plan: General   Post-op Pain Management:    Induction: Intravenous  PONV Risk Score and Plan: 3 and Ondansetron, Dexamethasone, Midazolam and Treatment may vary due to age or medical condition  Airway Management Planned: Mask and LMA  Additional Equipment: None  Intra-op Plan:   Post-operative Plan: Extubation in OR  Informed Consent: I have reviewed the patients History and Physical, chart, labs and discussed the procedure including the risks, benefits and alternatives for the proposed anesthesia with the patient or authorized representative who has indicated his/her understanding and acceptance.     Dental advisory given  Plan Discussed with: CRNA  Anesthesia Plan Comments: (Lab Results      Component                Value               Date                       WBC                      8.7                 07/11/2020                HGB                      10.2 (L)            09/26/2020                HCT                      30.0 (L)            09/26/2020                MCV                      95.3                07/11/2020                PLT  245                 07/11/2020           Lab Results      Component                Value               Date                      NA                       139                 09/26/2020                K                        5.3 (H)             09/26/2020                CO2                      25                  07/12/2020                GLUCOSE                  148 (H)             09/26/2020                BUN                      39 (H)              09/26/2020                CREATININE               9.00 (H)            09/26/2020                CALCIUM                  8.7 (L)             07/12/2020                GFRNONAA                 8 (L)               07/12/2020                GFRAA                    8 (L)               03/06/2020          )        Anesthesia Quick Evaluation

## 2020-09-26 NOTE — Op Note (Signed)
Patient name: Martha [name] MRN: [id number] DOB: Jul 22, 1966 Sex: female  09/26/2020 Pre-operative Diagnosis: esrd, left hand steal syndrome Post-operative diagnosis:  Same Surgeon:  Eda Paschal. Donzetta Matters, MD Assistant: Ellsworth Lennox, RNFA Procedure Performed: 1.  Left upper extremity distal revascularization with interval brachial artery ligation with reversed left lower extremity greater saphenous vein 2.  Harvest left greater saphenous vein  Indications: 55 year old female with clinical left hand steal syndrome from previously placed left upper extremity AV fistula.  She has undergone angiography as well as vein mapping and is indicated for distal revascularization with interval ligation.  Findings: The brachial artery in the mid upper arm was somewhat diseased although there was good flow there did have a thrill all the way up into the upper arm 10 cm proximal to the fistula.  Fistula excellent thrill before and after bypass.  After bypass we had great signals in the radial and ulnar artery distally and the brachial artery was ligated just proximal to our distal anastomosis.   Procedure:  The patient was identified in the holding area and taken to the operating room where she is placed supine on the operative table and general anesthesia induced.  She was sterilely prepped draped in the left upper and left lower extremity usual fashion antibiotics were administered and a timeout was called.  Ultrasound was used to identify the fistula anastomosis and the skin was marked 10 cm proximal to this and and longitudinal incision was made overlying the brachial artery.  We also made a longitudinal incision overlying the brachial artery distally below the antecubitum.  I then turned my attention to the left thigh.  Ultrasound was used to identify the greater saphenous vein.  An initial incision was made just above the knee.  We dissected down to what was a quite large and healthy-appearing saphenous vein.  2  separate skip incisions were made up the thigh.  We divided branches between clips and ties.  We then turned our attention back to the arm.  In the upper arm we dissected down to the brachial artery.  The vein was retracted medially the artery we placed a vessel loop around.  We then began a tunnel towards the antecubitum.  Distally we dissected down through the skin and soft tissue there was significant scar tissue.  We dissected out the anastomosis the brachial artery and then the radial ulnar and interosseous vessels distally and placed Vesseloops around all of these.  We created a tunnel between the 2 incisions and placed an umbilical tape trimmed this to size.  We then ensured that we had enough vein proximally distally.  We then turned our attention back to the leg we clamped and transected the vein distally and proximally and these were tied off with 2-0 silk suture.  We then fully heparinized the patient.  We clamped the brachial artery proximally followed by distally opened longitudinally.  I reversed the vein and sewed it end-to-side with 6-0 Prolene suture.  Upon completion we released our clamps.  We had very good flow through our vein.  We marked if orientation clipped and tunneled it through our previously established tunnel.  We then clamped the 3 outflow vessels using Vesseloops.  I clamped the brachial artery just past the anastomosis opened longitudinally.  I then straightened the vein trimmed to size reclamped it flushed with heparinized saline and sewed it into side with 6-0 Prolene suture.  Upon completion we released our clamps.  We had good flow distally.  There was a thrill within the bypass.  We had improved signal with clamping her brachial artery proximal to our anastomosis this was both within the wound bed and at the wrist radial and ulnar arteries.  We then ligated the brachial artery with a 2-0 silk suture.  We still had good radial and ulnar signals at the wrist.  We obtained stasis  in all of our wounds.  25 mg of protamine was administered.  We irrigated all the wounds in the leg and the arm and closed in layers with Vicryl and Monocryl.  Dermabond was placed on the leg and the arm at all 5 incision sites.  She was then awakened anesthesia having tolerated procedure well without immediate complication.  All counts were correct at completion.   EBL: 50cc  Gracielynn Birkel C. Donzetta Matters, MD [facility] [place] Office: [phone] Pager: [phone]

## 2020-09-26 NOTE — Anesthesia Postprocedure Evaluation (Signed)
Anesthesia Post Note  Patient: Martha [name]  Procedure(s) Performed: LEFT ARTERIOVENOUS FISTULA  DISTAL REVASCULARIZATION AND LIGATION ( DRIL ) USING GREATER SAPHENOUS VEIN (Left ) LEFT LEG GREATER SAPHENOUS VEIN HARVEST (Left )     Patient location during evaluation: PACU Anesthesia Type: General Level of consciousness: awake and alert Pain management: pain level controlled Vital Signs Assessment: post-procedure vital signs reviewed and stable Respiratory status: spontaneous breathing, nonlabored ventilation, respiratory function stable and patient connected to nasal cannula oxygen Cardiovascular status: blood pressure returned to baseline and stable Postop Assessment: no apparent nausea or vomiting Anesthetic complications: no   No complications documented.  Last Vitals:  Vitals:   09/26/20 1620 09/26/20 1652  BP: (!) 152/90 (!) 173/76  Pulse: 64 66  Resp: 18 19  Temp:  36.7 C  SpO2: 100% 99%    Last Pain:  Vitals:   09/26/20 1652  TempSrc: Oral  PainSc:                  Martha [name]

## 2020-09-26 NOTE — Progress Notes (Addendum)
55 yr female with ESRD Chronic HD MWF, [facility] DM >12 Yr, HTN >53yr bipolar, massive obesity, OSA, COPD, Blindness, Anemia of ESRD.  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 Today planned  Dr CDonzetta MattersLUA distal revascularization with interval brachial artery ligation with reversed left lower extremity greater saphenous vein with  Harvest left greater saphenous vein.   She missed HD 2/2 need to schedule steal surgery reversal. Noted Pre op K 5.3, BUN 39, Cr  9.0  HGB 10.2   Seen in recovery room ,no sob, cp 100% O2 sat on [place] O2 , Left hand warm, avf pos bruit   OP HD Orders = [facility] 4.5 hrs MWF  edw 139 kg, 2k, 2.25ca , Hep 1,000bolus then  Hourly pump 800 units  EPO 1200 q hd Hec 1.5 mcg q hd   Will plan for [facility] tomorrow        DErnest Haber PA-C [facility] [phone]/08/2020,3:47 PM  LOS: 0 days   Labs: Basic Metabolic Panel: Recent Labs  Lab 09/26/20 0911  NA 139  K 5.3*  CL 102  GLUCOSE 148*  BUN 39*  CREATININE 9.00*   Liver Function Tests: No results for input(s): AST, ALT, ALKPHOS, BILITOT, PROT, ALBUMIN in the last 168 hours. No results for input(s): LIPASE, AMYLASE in the last 168 hours. No results for input(s): AMMONIA in the last 168 hours. CBC: Recent Labs  Lab 09/26/20 0911  HGB 10.2*  HCT 30.0*   Cardiac Enzymes: No results for input(s): CKTOTAL, CKMB, CKMBINDEX, TROPONINI in the last 168 hours. CBG: Recent Labs  Lab 09/26/20 0755 09/26/20 1325  GLUCAP 146* 130*    Studies/Results: No results found. Medications: . sodium chloride    . acetaminophen 1,000 mg (09/26/20 1418)  . ceFAZolin     . chlorhexidine  60 mL Topical Once   And  . [START ON 09/27/2020] chlorhexidine  60 mL Topical Once  . HYDROcodone-acetaminophen  1 tablet Oral QID    Pt wants to leave due to an appointment and will have HD at her outpatient unit on Friday.  lokelma given.

## 2020-09-26 NOTE — Progress Notes (Signed)
Spoke to Dr. Gloris Manchester regarding iCa and K+ results.  No orders received.

## 2020-09-26 NOTE — Transfer of Care (Signed)
Immediate Anesthesia Transfer of Care Note  Patient: Martha [name]  Procedure(s) Performed: LEFT ARTERIOVENOUS FISTULA  DISTAL REVASCULARIZATION AND LIGATION ( DRIL ) USING GREATER SAPHENOUS VEIN (Left ) LEFT LEG GREATER SAPHENOUS VEIN HARVEST (Left )  Patient Location: PACU  Anesthesia Type:General  Level of Consciousness: awake, alert , oriented and patient cooperative  Airway & Oxygen Therapy: Patient Spontanous Breathing  Post-op Assessment: Report given to RN and Post -op Vital signs reviewed and stable  Post vital signs: Reviewed and stable  Last Vitals:  Vitals Value Taken Time  BP    Temp    Pulse 65 09/26/20 1321  Resp 15 09/26/20 1321  SpO2 98 % 09/26/20 1321  Vitals shown include unvalidated device data.  Last Pain:  Vitals:   09/26/20 0816  TempSrc:   PainSc: 7          Complications: No complications documented.

## 2020-09-26 NOTE — Progress Notes (Signed)
Pt placed on cpap 

## 2020-09-26 NOTE — Progress Notes (Signed)
CRITICAL RESULT PROVIDER NOTIFICATION  Test performed and critical result: iCa 0.86  Date and time result received:  09/26/20 at 0915  Provider name/title: Dr. Gloris Manchester, Anesthesiologis  Date and time provider notified: 09/26/20 at 0930  Date and time provider responded: 09/26/20 at 0930  Provider response:No new orders

## 2020-09-27 ENCOUNTER — Encounter (HOSPITAL_COMMUNITY): Payer: Self-pay | Admitting: Vascular Surgery

## 2020-09-27 LAB — CBC
HCT: 28.1 % — ABNORMAL LOW (ref 36.0–46.0)
Hemoglobin: 8.7 g/dL — ABNORMAL LOW (ref 12.0–15.0)
MCH: 28.9 pg (ref 26.0–34.0)
MCHC: 31 g/dL (ref 30.0–36.0)
MCV: 93.4 fL (ref 80.0–100.0)
Platelets: 142 10*3/uL — ABNORMAL LOW (ref 150–400)
RBC: 3.01 MIL/uL — ABNORMAL LOW (ref 3.87–5.11)
RDW: 16.6 % — ABNORMAL HIGH (ref 11.5–15.5)
WBC: 8.4 10*3/uL (ref 4.0–10.5)
nRBC: 0 % (ref 0.0–0.2)

## 2020-09-27 LAB — BASIC METABOLIC PANEL
Anion gap: 15 (ref 5–15)
BUN: 48 mg/dL — ABNORMAL HIGH (ref 6–20)
CO2: 21 mmol/L — ABNORMAL LOW (ref 22–32)
Calcium: 7.8 mg/dL — ABNORMAL LOW (ref 8.9–10.3)
Chloride: 98 mmol/L (ref 98–111)
Creatinine, Ser: 9.67 mg/dL — ABNORMAL HIGH (ref 0.44–1.00)
GFR, Estimated: 4 mL/min — ABNORMAL LOW (ref >60)
Glucose, Bld: 197 mg/dL — ABNORMAL HIGH (ref 70–99)
Potassium: 5.8 mmol/L — ABNORMAL HIGH (ref 3.5–5.1)
Sodium: 134 mmol/L — ABNORMAL LOW (ref 135–145)

## 2020-09-27 LAB — GLUCOSE, CAPILLARY: Glucose-Capillary: 136 mg/dL — ABNORMAL HIGH (ref 70–99)

## 2020-09-27 MED ORDER — LOKELMA 10 G PO PACK: 10.0000 g | PACK | Freq: Three times a day (TID) | ORAL | 0 refills | Status: DC

## 2020-09-27 MED ORDER — SODIUM ZIRCONIUM CYCLOSILICATE 10 G PO PACK
10.0000 g | PACK | Freq: Once | ORAL | Status: AC
  Administered 2020-09-27: 10 g via ORAL
  Filled 2020-09-27: qty 1

## 2020-09-27 NOTE — Discharge Instructions (Signed)
Take Lokelma for high potassium take 8 hours after discharge and then 8 hours later and then go to HD tomorrow.

## 2020-09-27 NOTE — Progress Notes (Addendum)
[facility]  Subjective  - States left hand feels fine, the vein harvest site has more pain than the arm.   Objective (!) 142/67 63 97.8 F (36.6 C) (Axillary) 20 100%  Intake/Output Summary (Last 24 hours) at 09/27/2020 0728 Last data filed at 09/27/2020 0300 Gross per 24 hour  Intake 793.33 ml  Output 350 ml  Net 443.33 ml    Left hand grip 5/5, sensation intact, palpable thrill in left AV fistula.  Incisions healing well Left medial thigh incision healing well Doppler signal in radial/ulnar artery left UE Lungs non labored breathing  Assessment/Planning: POD # 1 Left upper extremity distal revascularization with interval brachial artery ligation with reversed left lower extremity greater saphenous vein for steal  Plan for HD tomorrow I spoke with [facility] and he ask me to order Lokelma 10 g now then q 8 x 2 doses at home. She is on chronic pain medication and has Norco at home. D/C home today F/U in 2-3 weeks for incision checks.  Roxy Horseman 09/27/2020 7:28 AM --  Laboratory Lab Results: Recent Labs    09/26/20 1752 09/27/20 0203  WBC 10.0 8.4  HGB 9.7* 8.7*  HCT 31.6* 28.1*  PLT 173 142*   BMET Recent Labs    09/26/20 0911 09/26/20 1752 09/27/20 0203  NA 139  --  134*  K 5.3*  --  5.8*  CL 102  --  98  CO2  --   --  21*  GLUCOSE 148*  --  197*  BUN 39*  --  48*  CREATININE 9.00* 8.82* 9.67*  CALCIUM  --   --  7.8*    COAG Lab Results  Component Value Date   INR 1.3 (H) 07/06/2020   INR 1.3 (H) 07/05/2020   INR 1.07 05/05/2018   No results found for: PTT   I have seen and evaluated the patient. I agree with the PA note as documented above.  55 year old female postop day 1 status post left upper extremity DRIL with Dr. Donzetta Matters.  Incisions look good and she has a thrill in the fistula.  She has brisk radial ulnar and palmar arch signals at the left wrist.  States her hand feels better.  Her  potassium is 5.8 this morning but she wants to be discharged to make an urgent appointment in [place].  Nephrology is okay with [place] giving her [facility] and then she has her dialysis appointment as scheduled tomorrow morning as an outpatient at 5 am.  Marty Heck, MD [facility]: [phone]

## 2020-09-28 ENCOUNTER — Telehealth: Payer: Self-pay

## 2020-09-28 NOTE — Telephone Encounter (Signed)
Patient started bleeding from L leg where vein was harvested on Wednesday. It has bled all day, not forming a hematoma. Advised to hold firm steady pressure without peaking for 10 minutes and repeat if still bleeding. Patient verbalizes understanding and knows to call back if unable to stop bleeding.

## 2020-09-28 NOTE — Discharge Summary (Signed)
[facility] Discharge Summary   Patient ID:  Martha [name] MRN: [id number] DOB/AGE: May 14, 1966 55 y.o.  Admit date: 09/26/2020 Discharge date: 09/27/20 Date of Surgery: 09/26/2020 Surgeon: Surgeon(s): Waynetta Sandy, MD  Admission Diagnosis: ESRD (end stage renal disease) [facility]) [N18.6]  Discharge Diagnoses:  ESRD (end stage renal disease) ([place]) [N18.6]  Secondary Diagnoses: Past Medical History:  Diagnosis Date  . Acid reflux    takes Tums  . Anemia   . Arthritis   . Bipolar 1 disorder ([place])   . Blindness of left eye    left eye removed  . Carpal tunnel syndrome, bilateral   . Cervical radiculopathy   . CHF (congestive heart failure) ([place])   . Chronic kidney disease    Stage 5- 01/25/17  . Constipation   . COPD (chronic obstructive pulmonary disease) ([place]) 2014   bronchitis  . Degenerative disc disease, thoracic   . Depression   . Diabetes mellitus    Type II  . Diabetic retinopathy ([place])   . Dyspnea    when walking  . End stage kidney disease ([place])    M, W, F [facility]  . Gout   . Hypertension   . Noncompliance with medication regimen   . Noncompliance with medication regimen   . Obesity (BMI 30-39.9)   . OSA (obstructive sleep apnea)    cpap  . Panic attack   . RLS (restless legs syndrome)     Procedure(s): LEFT ARTERIOVENOUS FISTULA  DISTAL REVASCULARIZATION AND LIGATION ( DRIL ) USING GREATER SAPHENOUS VEIN LEFT LEG GREATER SAPHENOUS VEIN HARVEST  Discharged Condition: good  HPI: Martha [name] is a 55 y.o. female previous left second stage basilic vein fistula in September 2020.  She has a previous fistula ligated on the right arm.  She states that she has pain in her left hand particularly with dialysis but also in the middle of the night that wakes her up.    Hospital Course:  Martha [name] is a 55 y.o. female is S/P Procedure(s): LEFT ARTERIOVENOUS FISTULA  DISTAL REVASCULARIZATION AND LIGATION ( DRIL ) USING  GREATER SAPHENOUS VEIN LEFT LEG GREATER SAPHENOUS VEIN HARVEST POD # 1 Left upper extremity distal revascularization with interval brachial artery ligation with reversed left lower extremity greater saphenous vein for steal   Left hand grip 5/5, sensation intact, palpable thrill in left AV fistula.  Incisions healing well Patient reports she had an appointment on 09/27/20 that she can't miss.  She did not want to stay for HD today, and she did not get HD the day before.  He K+ was 5.8.  Plan for HD tomorrow I spoke with [facility] and he ask me to order Lokelma 10 g now then q 8 x 2 doses at home. She is on chronic pain medication and has Norco at home. D/C home today F/U in 2-3 weeks for incision checks.   Significant Diagnostic Studies: CBC Lab Results  Component Value Date   WBC 8.4 09/27/2020   HGB 8.7 (L) 09/27/2020   HCT 28.1 (L) 09/27/2020   MCV 93.4 09/27/2020   PLT 142 (L) 09/27/2020    BMET    Component Value Date/Time   NA 134 (L) 09/27/2020 0203   K 5.8 (H) 09/27/2020 0203   CL 98 09/27/2020 0203   CO2 21 (L) 09/27/2020 0203   GLUCOSE 197 (H) 09/27/2020 0203   BUN 48 (H) 09/27/2020 0203   CREATININE 9.67 (H) 09/27/2020 0203  CALCIUM 7.8 (L) 09/27/2020 0203   CALCIUM 9.4 04/10/2017 0425   GFRNONAA 4 (L) 09/27/2020 0203   GFRAA 8 (L) 03/06/2020 0215   COAG Lab Results  Component Value Date   INR 1.3 (H) 07/06/2020   INR 1.3 (H) 07/05/2020   INR 1.07 05/05/2018     Disposition:  Discharge to :Home Discharge Instructions    Call MD for:  redness, tenderness, or signs of infection (pain, swelling, bleeding, redness, odor or green/yellow discharge around incision site)   Complete by: As directed    Call MD for:  severe or increased pain, loss or decreased feeling  in affected limb(s)   Complete by: As directed    Call MD for:  temperature >100.5   Complete by: As directed    Resume previous diet   Complete by: As directed      Allergies  as of 09/27/2020      Reactions   Codeine Nausea And Vomiting   Tape Rash, Other (See Comments)   Pull skin off.  Paper tape is ok      Medication List    TAKE these medications   albuterol (2.5 MG/3ML) 0.083% nebulizer solution Commonly known as: PROVENTIL Take 2.5 mg by nebulization every 12 (twelve) hours as needed for wheezing or shortness of breath.   ALPRAZolam 1 MG tablet Commonly known as: XANAX Take 1 tablet (1 mg total) by mouth 4 (four) times daily as needed for anxiety. What changed: when to take this   Basaglar KwikPen 100 UNIT/ML Inject 30 Units into the skin at bedtime.   busPIRone 7.5 MG tablet Commonly known as: BUSPAR Take 1 tablet (7.5 mg total) by mouth 2 (two) times daily.   calcium carbonate 750 MG chewable tablet Commonly known as: TUMS EX Chew 1-3 tablets by mouth See admin instructions. Take 3 tablets with each meal & take 1 tablets with each snack   cinacalcet 60 MG tablet Commonly known as: SENSIPAR Take 60 mg by mouth daily.   dorzolamide-timolol 22.3-6.8 MG/ML ophthalmic solution Commonly known as: COSOPT Place 1 drop into the right eye in the morning and at bedtime.   doxercalciferol 4 MCG/2ML injection Commonly known as: HECTOROL Inject 0.5 mLs (1 mcg total) into the vein every Monday, Wednesday, and Friday with hemodialysis.   DRY EYES OP Place 1 drop into the right eye 2 (two) times daily.   gabapentin 100 MG capsule Commonly known as: NEURONTIN Take 1 capsule (100 mg total) by mouth at bedtime. What changed: how much to take   HYDROcodone-acetaminophen 10-325 MG tablet Commonly known as: NORCO Take 1 tablet by mouth 4 (four) times daily.   insulin aspart 100 UNIT/ML injection Commonly known as: novoLOG Inject 0-9 Units into the skin 3 (three) times daily with meals. What changed:   how much to take  additional instructions   latanoprost 0.005 % ophthalmic solution Commonly known as: XALATAN Place 1 drop into the right  eye 2 (two) times daily.   lidocaine-prilocaine cream Commonly known as: EMLA Apply 1 application topically as needed (topical anesthesia for hemodialysis if Gebauers and Lidocaine injection are ineffective.). What changed:   when to take this  reasons to take this   Lokelma 10 g Pack packet Generic drug: sodium zirconium cyclosilicate Take 10 g by mouth 3 (three) times daily.   omeprazole 40 MG capsule Commonly known as: PRILOSEC Take 40 mg by mouth daily.   PARoxetine 20 MG tablet Commonly known as: PAXIL Take 1 tablet (20  mg total) by mouth every evening. This is to prevent panic attacks What changed:   when to take this  additional instructions   prednisoLONE acetate 1 % ophthalmic suspension Commonly known as: PRED FORTE Place 1 drop into the right eye 2 (two) times daily.   pregabalin 75 MG capsule Commonly known as: LYRICA Take 75 mg by mouth daily.      Verbal and written Discharge instructions given to the patient. Wound care per Discharge AVS  Follow-up Information    Waynetta Sandy, MD Follow up in 2 week(s).   Specialties: Vascular Surgery, Cardiology Contact information: [address] [place] [address] [phone]               Signed: Roxy Horseman 09/28/2020, 10:38 AM

## 2020-10-01 ENCOUNTER — Telehealth: Payer: Self-pay

## 2020-10-01 NOTE — Telephone Encounter (Signed)
Received call from [place]. Patient's wound on leg from vein harvest is open. It is not red - he sent in Ancef for patient. Called patient, she is unable to make appt - advised to keep wound clean - moistened saline on the open area and covered with a dry gauze. Patient verbalizes understanding.

## 2020-10-10 ENCOUNTER — Telehealth: Payer: Self-pay

## 2020-10-10 NOTE — Telephone Encounter (Signed)
Ramiro Harvest, PA's office called saying pt is complaining of hand pain/especially left thumb.  I called patient to assess pain level.  She is at an 8 during dialysis.  Pt says she can wait until her appointment with [place] on 03/25.  She will call for an earlier appointment if pain is worse.

## 2020-10-12 ENCOUNTER — Encounter: Payer: Medicaid Other | Admitting: Vascular Surgery

## 2020-10-12 ENCOUNTER — Encounter (HOSPITAL_COMMUNITY): Payer: Medicaid Other

## 2020-10-19 ENCOUNTER — Ambulatory Visit (INDEPENDENT_AMBULATORY_CARE_PROVIDER_SITE_OTHER): Payer: Medicaid Other | Admitting: Physician Assistant

## 2020-10-19 ENCOUNTER — Other Ambulatory Visit: Payer: Self-pay

## 2020-10-19 VITALS — BP 156/79 | HR 60 | Temp 98.0°F | Resp 20 | Ht 67.0 in | Wt 308.0 lb

## 2020-10-19 DIAGNOSIS — T82898D Other specified complication of vascular prosthetic devices, implants and grafts, subsequent encounter: Secondary | ICD-10-CM

## 2020-10-19 DIAGNOSIS — N186 End stage renal disease: Secondary | ICD-10-CM

## 2020-10-19 DIAGNOSIS — Z992 Dependence on renal dialysis: Secondary | ICD-10-CM

## 2020-10-19 DIAGNOSIS — T8131XA Disruption of external operation (surgical) wound, not elsewhere classified, initial encounter: Secondary | ICD-10-CM

## 2020-10-19 NOTE — Progress Notes (Signed)
POST OPERATIVE DIALYSIS ACCESS OFFICE NOTE    CC:  F/u for dialysis access surgery  HPI:  This is a 55 y.o. female who is s/p Left upper extremity distal revascularization with interval brachial artery ligation with reversed left lower extremity greater saphenous vein and harvest left greater saphenous vein on 09/26/2020 by Dr. Donzetta Matters. This was due to steal syndrome and left hand pain. She states her pain in not improved, hurts constantly worse on dialysis and occasionally hurts during the night.  She denies any issues with accessing her fistula or with treatment.  She is blind and arrives in wheelchair. She lives alone and uses [facility] services.  Dialysis days:  MWF Dialysis center:  [place]  Allergies  Allergen Reactions  . Codeine Nausea And Vomiting  . Tape Rash and Other (See Comments)    Pull skin off.  Paper tape is ok    Current Outpatient Medications  Medication Sig Dispense Refill  . albuterol (PROVENTIL) (2.5 MG/3ML) 0.083% nebulizer solution Take 2.5 mg by nebulization every 12 (twelve) hours as needed for wheezing or shortness of breath.    . ALPRAZolam (XANAX) 1 MG tablet Take 1 tablet (1 mg total) by mouth 4 (four) times daily as needed for anxiety. (Patient taking differently: Take 1 mg by mouth 4 (four) times daily.) 9 tablet 0  . Artificial Tear Ointment (DRY EYES OP) Place 1 drop into the right eye 2 (two) times daily.    . Blood Glucose Monitoring Suppl (FORA V30A BLOOD GLUCOSE SYSTEM) DEVI     . busPIRone (BUSPAR) 7.5 MG tablet Take 1 tablet (7.5 mg total) by mouth 2 (two) times daily. 7 tablet 0  . calcium carbonate (TUMS EX) 750 MG chewable tablet Chew 1-3 tablets by mouth See admin instructions. Take 3 tablets with each meal & take 1 tablets with each snack    . cinacalcet (SENSIPAR) 60 MG tablet Take 60 mg by mouth daily.    . diphenoxylate-atropine (LOMOTIL) 2.5-0.025 MG tablet Take 1 tablet by mouth 4 (four) times daily.    . dorzolamide-timolol  (COSOPT) 22.3-6.8 MG/ML ophthalmic solution Place 1 drop into the right eye in the morning and at bedtime.    Marland Kitchen doxercalciferol (HECTOROL) 4 MCG/2ML injection Inject 0.5 mLs (1 mcg total) into the vein every Monday, Wednesday, and Friday with hemodialysis. 2 mL   . FORACARE PREMIUM V10 TEST test strip     . gabapentin (NEURONTIN) 100 MG capsule Take 1 capsule (100 mg total) by mouth at bedtime. (Patient taking differently: Take 300 mg by mouth at bedtime.) 7 capsule 0  . HYDROcodone-acetaminophen (NORCO) 10-325 MG tablet Take 1 tablet by mouth 4 (four) times daily.    . insulin aspart (NOVOLOG) 100 UNIT/ML injection Inject 0-9 Units into the skin 3 (three) times daily with meals. (Patient taking differently: Inject 4-7 Units into the skin 3 (three) times daily with meals. Sliding scale insulin) 10 mL 11  . Insulin Glargine (BASAGLAR KWIKPEN) 100 UNIT/ML Inject 30 Units into the skin at bedtime. 10 mL 0  . latanoprost (XALATAN) 0.005 % ophthalmic solution Place 1 drop into the right eye 2 (two) times daily.    Marland Kitchen lidocaine-prilocaine (EMLA) cream Apply 1 application topically as needed (topical anesthesia for hemodialysis if Gebauers and Lidocaine injection are ineffective.). (Patient taking differently: Apply 1 application topically daily as needed (dialysis.).) 30 g 0  . omeprazole (PRILOSEC) 40 MG capsule Take 40 mg by mouth daily.    Marland Kitchen PARoxetine (PAXIL) 20  MG tablet Take 1 tablet (20 mg total) by mouth every evening. This is to prevent panic attacks (Patient taking differently: Take 20 mg by mouth at bedtime.) 7 tablet 0  . prednisoLONE acetate (PRED FORTE) 1 % ophthalmic suspension Place 1 drop into the right eye 2 (two) times daily.    . pregabalin (LYRICA) 75 MG capsule Take 75 mg by mouth daily.    . sodium zirconium cyclosilicate (LOKELMA) 10 g PACK packet Take 10 g by mouth 3 (three) times daily. 2 packet 0  . SPS 15 GM/60ML suspension Take by mouth.     Current Facility-Administered  Medications  Medication Dose Route Frequency Provider Last Rate Last Admin  . 0.9 %  sodium chloride infusion  250 mL Intravenous PRN Waynetta Sandy, MD      . sodium chloride flush (NS) 0.9 % injection 3 mL  3 mL Intravenous Q12H Waynetta Sandy, MD      . sodium chloride flush (NS) 0.9 % injection 3 mL  3 mL Intravenous PRN Waynetta Sandy, MD         ROS:  See HPI  BP (!) 156/79 (BP Location: Right Arm, Patient Position: Sitting, Cuff Size: Normal)   Pulse 60   Temp 98 F (36.7 C) (Temporal)   Resp 20   Ht '5\' 7"'$  (1.702 m)   Wt (!) 307 lb 15.7 oz (139.7 kg)   LMP 08/03/2008 (Exact Date)   SpO2 98%   BMI 48.24 kg/m    Physical Exam:  General appearance: in NAD Cardiac:RRR Respiratory: non-labored Incision:  LUE incisions well-healed. Distal saphenectomy site has approx 4cm area of dehiscence, clear serous drainage. Probed with q-tip. Extremities:  Hand is warm with very slight pallor as compared to right hand. 5/5 hand grip strength. Sensation intact Good bruit and thrill in fistula.     Assessment/Plan: 55 year old female status post left upper extremity distal revascularization secondary to steal syndrome.  Her fistula is functioning well.  She continues to complain of left hand pain.  I discussed the case with Dr. Donzetta Matters who examined the patient.  Her hand is much warmer as compared to preoperative eval.  Motor function and sensation are intact.  She was reassured and advised we would continue close monitoring and follow-up in 2 weeks with Dr. Donzetta Matters with steal study. Left saphenectomy site dehiscence.  No evidence of cellulitis.  Promogran dressing applied.  I will place staff message to contact [facility] home health with regards to local wound care.   Barbie Banner, PA-C 10/19/2020 11:59 AM [facility] [phone]  Clinic MD:  Donzetta Matters

## 2020-10-24 ENCOUNTER — Other Ambulatory Visit: Payer: Self-pay | Admitting: *Deleted

## 2020-10-24 DIAGNOSIS — N186 End stage renal disease: Secondary | ICD-10-CM

## 2020-10-31 ENCOUNTER — Telehealth: Payer: Self-pay

## 2020-10-31 NOTE — Telephone Encounter (Signed)
Called and s/w pt about difficulty in getting home health for wound care. Explained that the agencies that I had contacted either did not staff her area, not enough staff or did not accept her particular insurance. She stated that her nephew has been doing the dressing changes and her would was almost completely closed. Also stated that she seen her PCP earlier in the week and was told the area looked good and that she did not need to try to pack the wound any longer. Pt denied any signs of infection, also denied any pain. Pt agreed that she did not need home health services for wound care at this time. Gave pt f/u appointment time and date, and advised that if she had any questions or concerns regarding her wound that she call our office. Pt verbalized understanding and agreed with this plan.

## 2020-11-02 ENCOUNTER — Ambulatory Visit (HOSPITAL_COMMUNITY): Admission: RE | Admit: 2020-11-02 | Payer: Medicaid Other | Source: Ambulatory Visit

## 2020-11-02 ENCOUNTER — Telehealth: Payer: Self-pay

## 2020-11-02 ENCOUNTER — Ambulatory Visit: Payer: Medicaid Other | Admitting: Vascular Surgery

## 2020-11-02 NOTE — Telephone Encounter (Signed)
Patient unable to come to appt today -talked to [facility] - they had to r/s for May - patient's pcp said site looked okay. They were concerned that the appt was not until May, but denied any issues. Offered an appt on Monday with [facility] but they were unable to be seen. Advised them to call back if there were any issues.

## 2020-11-25 IMAGING — DX LEFT TIBIA AND FIBULA - 2 VIEW
2 series · 2 of 2 positions shown · non-contrast
Comparison: February 11, 2013

CLINICAL DATA: Pain following fall

EXAM:
LEFT TIBIA AND FIBULA - 2 VIEW

[tibia ap]
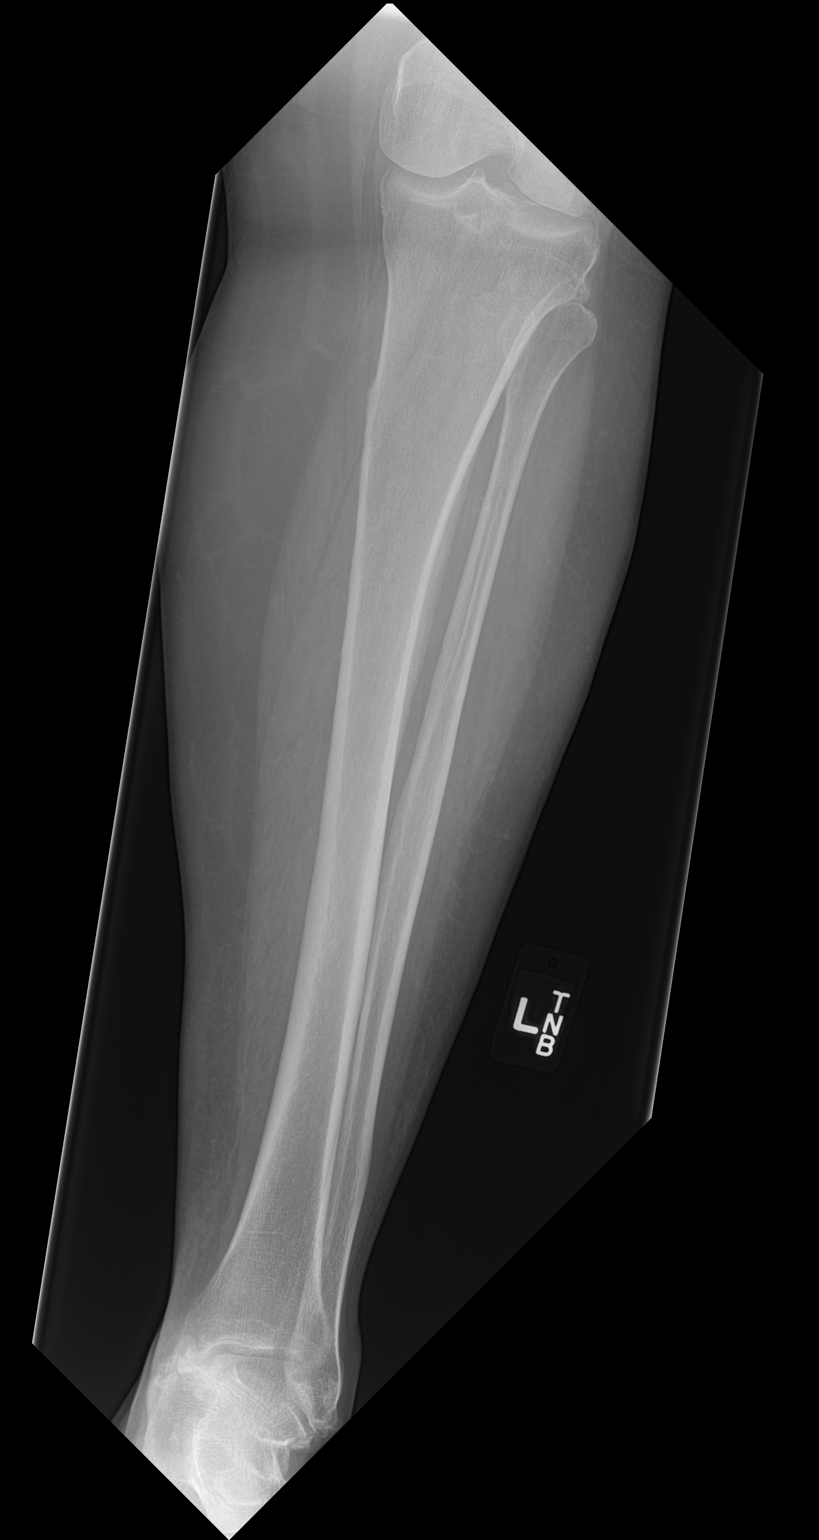

[tibia lat]
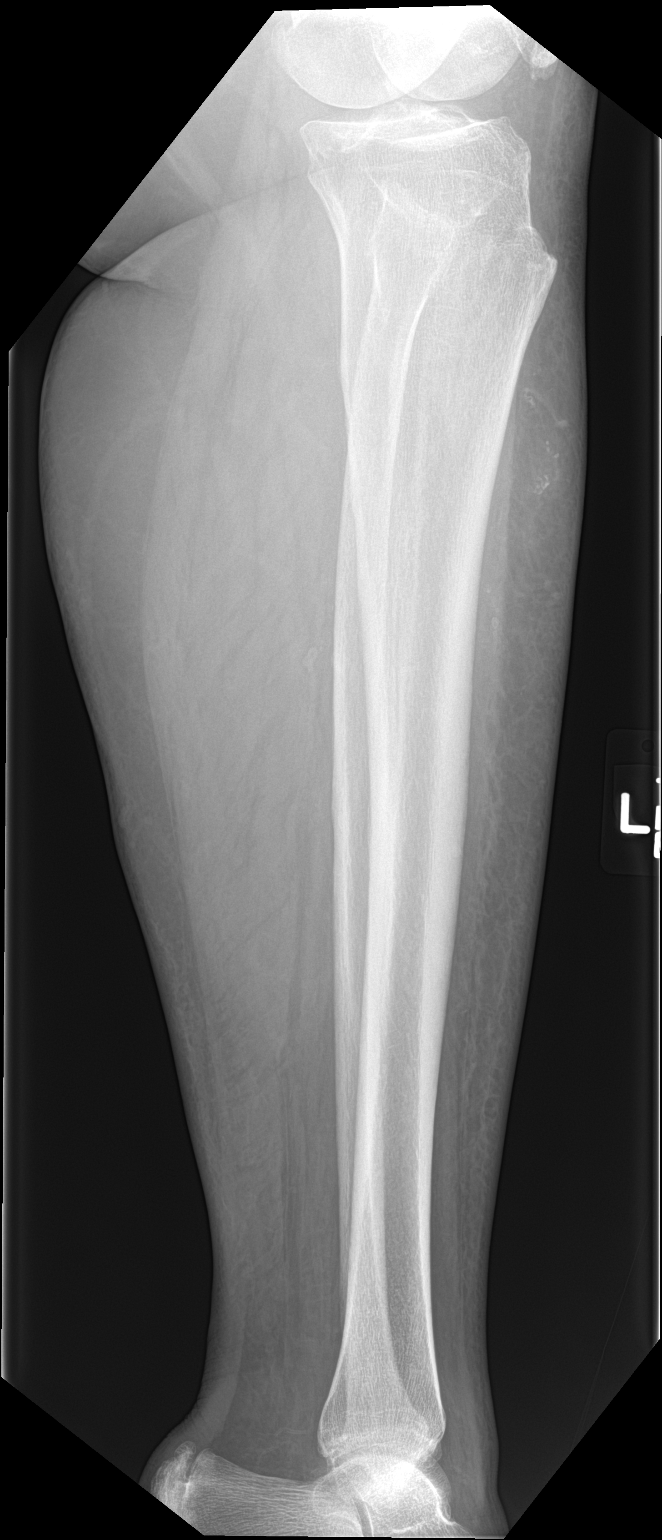

[2 of 2 positions shown; findings below may reference images not displayed]

FINDINGS: Frontal and lateral views were obtained. There is evidence of an old
fracture of the proximal tibia with remodeling, stable in
appearance. No evident acute fracture or dislocation. There is
narrowing in the medial ankle joint region. There is also mild
narrowing of the medial and lateral compartments of the knee.
IMPRESSION: Areas of arthropathy in the knee and ankle joints, incompletely
visualized. Evidence of old trauma involving the proximal tibia with
remodeling, unchanged. No acute fracture or dislocation
demonstrable.

## 2020-12-07 ENCOUNTER — Other Ambulatory Visit (HOSPITAL_COMMUNITY): Payer: Medicaid Other

## 2020-12-07 ENCOUNTER — Ambulatory Visit: Payer: Medicaid Other | Admitting: Vascular Surgery

## 2020-12-11 ENCOUNTER — Encounter: Payer: Self-pay | Admitting: Vascular Surgery

## 2020-12-11 ENCOUNTER — Ambulatory Visit (HOSPITAL_COMMUNITY)
Admission: RE | Admit: 2020-12-11 | Discharge: 2020-12-11 | Disposition: A | Discharge status: Home / Self Care | Payer: Medicaid Other | Source: Ambulatory Visit | Attending: Vascular Surgery | Admitting: Vascular Surgery

## 2020-12-11 ENCOUNTER — Other Ambulatory Visit: Payer: Self-pay

## 2020-12-11 ENCOUNTER — Ambulatory Visit (INDEPENDENT_AMBULATORY_CARE_PROVIDER_SITE_OTHER): Payer: Self-pay | Admitting: Vascular Surgery

## 2020-12-11 VITALS — BP 145/77 | HR 75 | Temp 98.2°F | Resp 20 | Ht 67.0 in | Wt 307.0 lb

## 2020-12-11 DIAGNOSIS — Z992 Dependence on renal dialysis: Secondary | ICD-10-CM | POA: Diagnosis not present

## 2020-12-11 DIAGNOSIS — N186 End stage renal disease: Secondary | ICD-10-CM | POA: Insufficient documentation

## 2020-12-11 DIAGNOSIS — T82898D Other specified complication of vascular prosthetic devices, implants and grafts, subsequent encounter: Secondary | ICD-10-CM

## 2020-12-11 NOTE — Progress Notes (Signed)
Subjective:     Patient ID: Martha [name], female   DOB: March 28, 1966, 55 y.o.   MRN: [id number]  HPI 55 year old female with end-stage renal disease dialyzing via left arm AV fistula.  She recently underwent distal revascularization with interval brachial artery ligation.  She still states that she has pain when she is on dialysis but no other pain.  She has no tissue loss or ulceration.  Patient is adamant that she does not want a catheter.  She walks with help of a rolling walker.   Review of Systems Left hand pain on dialysis    Objective:   Physical Exam Vitals:   12/11/20 1455  BP: (!) 145/77  Pulse: 75  Resp: 20  Temp: 98.2 F (36.8 C)  Awake alert oriented On the respirations Left radial pulse palpable with compression of the fistula very strong signal with and without compression All incisions left upper arm and left thigh well-healed         Study Result  DIALYSIS STEAL     Patient Name: Martha [name] Date of Exam:  12/11/2020  Medical Rec #: [id number]   Accession #:  [id number]  Date of Birth: 11/07/1965   Patient Gender: F  Patient Age:  54Y  Exam Location: [facility]  Procedure:   VAS [place] STEAL EXAM  Referring Phys: [id number] [place]    ---------------------------------------------------------------------------  -----    Reason for Exam: Numbness and Tingling during dialysis.   Access Site: Left Upper Extremity.   History: 09/26/2020: Left AV fistula distal revascularization and ligation  using      greater saphenous vein.      04/26/2019: Second stage left basilic vein transposition.      08/03/2018: Left AV fistula creation.      06/08/2018: Ligation of AV fistula (right).   Comparison Study: 08/31/2020: Significant increase in pressure of the  second          digit with compression suggestive of a steal.   Performing Technologist: Ivan Croft      Examination Guidelines: A complete evaluation includes B-mode imaging,  spectral  Doppler, color Doppler, and power Doppler as needed of all accessible  portions  of each vessel. Bilateral testing is considered an integral part of a  complete  examination. Limited examinations for reoccurring indications may be  performed  as noted.     Findings:     +---------------------+--------+--------+--------+             Right  Left  Comments  +---------------------+--------+--------+--------+  Brachial                     +---------------------+--------+--------+--------+  Radial Ambient                  +---------------------+--------+--------+--------+  Radial AV Compression              +---------------------+--------+--------+--------+  Ulnar Ambient                  +---------------------+--------+--------+--------+  Ulnar AV Compression               +---------------------+--------+--------+--------+  2nd Digit Ambient  171 mmHg114 mmHg      +---------------------+--------+--------+--------+  2nd Digit Compression    188 mmHg      +---------------------+--------+--------+--------+     Summary:  Significant increase in second digit waveform and pressure with  compression,  suggestive of a steal.        Assessment/plan:  55 year old female status post distal revascularization with interval ligation left upper extremity for steal.  She does have an improvement in the gap between compression and noncompression pressures in the second digit on the left hand.  She still has pain while on dialysis unfortunately but no tissue loss or ulceration that she has brisk capillary refill of the left hand.  I discussed with her that any option for other access would require catheter at least for short period of time.  At this time she states she  will deal with the pain during dialysis.  We can see her on an as-needed basis.  Ruperto Kiernan C. Donzetta Matters, MD [facility] Office: [phone] Pager: [phone]

## 2021-05-13 ENCOUNTER — Other Ambulatory Visit: Payer: Self-pay | Admitting: Internal Medicine

## 2021-05-13 DIAGNOSIS — N6320 Unspecified lump in the left breast, unspecified quadrant: Secondary | ICD-10-CM

## 2021-05-30 ENCOUNTER — Other Ambulatory Visit: Payer: Self-pay

## 2021-05-30 ENCOUNTER — Ambulatory Visit
Admission: RE | Admit: 2021-05-30 | Discharge: 2021-05-30 | Disposition: A | Discharge status: Home / Self Care | Payer: Medicaid Other | Source: Ambulatory Visit | Attending: Internal Medicine | Admitting: Internal Medicine

## 2021-05-30 DIAGNOSIS — N6320 Unspecified lump in the left breast, unspecified quadrant: Secondary | ICD-10-CM

## 2021-10-17 ENCOUNTER — Other Ambulatory Visit: Payer: Self-pay

## 2021-10-17 ENCOUNTER — Emergency Department (HOSPITAL_COMMUNITY): Payer: Medicaid Other

## 2021-10-17 ENCOUNTER — Encounter: Payer: Self-pay | Admitting: Emergency Medicine

## 2021-10-17 ENCOUNTER — Ambulatory Visit
Admission: EM | Admit: 2021-10-17 | Discharge: 2021-10-17 | Disposition: A | Discharge status: Nursing Home (Includes ICF) | Payer: Medicaid Other | Attending: Urgent Care | Admitting: Urgent Care

## 2021-10-17 ENCOUNTER — Encounter (HOSPITAL_COMMUNITY): Payer: Self-pay | Admitting: *Deleted

## 2021-10-17 ENCOUNTER — Inpatient Hospital Stay (HOSPITAL_COMMUNITY)
Admission: EM | Admit: 2021-10-17 | Discharge: 2021-10-19 | DRG: 602 | Disposition: A | Discharge status: Home / Self Care | Payer: Medicaid Other | Attending: Internal Medicine | Admitting: Internal Medicine

## 2021-10-17 DIAGNOSIS — M79642 Pain in left hand: Secondary | ICD-10-CM | POA: Diagnosis present

## 2021-10-17 DIAGNOSIS — E113599 Type 2 diabetes mellitus with proliferative diabetic retinopathy without macular edema, unspecified eye: Secondary | ICD-10-CM | POA: Diagnosis present

## 2021-10-17 DIAGNOSIS — Z87891 Personal history of nicotine dependence: Secondary | ICD-10-CM

## 2021-10-17 DIAGNOSIS — E1122 Type 2 diabetes mellitus with diabetic chronic kidney disease: Secondary | ICD-10-CM | POA: Diagnosis present

## 2021-10-17 DIAGNOSIS — D649 Anemia, unspecified: Secondary | ICD-10-CM | POA: Diagnosis present

## 2021-10-17 DIAGNOSIS — K219 Gastro-esophageal reflux disease without esophagitis: Secondary | ICD-10-CM | POA: Diagnosis present

## 2021-10-17 DIAGNOSIS — F319 Bipolar disorder, unspecified: Secondary | ICD-10-CM | POA: Diagnosis present

## 2021-10-17 DIAGNOSIS — N186 End stage renal disease: Secondary | ICD-10-CM | POA: Diagnosis not present

## 2021-10-17 DIAGNOSIS — R0602 Shortness of breath: Secondary | ICD-10-CM | POA: Diagnosis not present

## 2021-10-17 DIAGNOSIS — Z9001 Acquired absence of eye: Secondary | ICD-10-CM

## 2021-10-17 DIAGNOSIS — E66813 Obesity, class 3: Secondary | ICD-10-CM

## 2021-10-17 DIAGNOSIS — G4733 Obstructive sleep apnea (adult) (pediatric): Secondary | ICD-10-CM

## 2021-10-17 DIAGNOSIS — Z9049 Acquired absence of other specified parts of digestive tract: Secondary | ICD-10-CM

## 2021-10-17 DIAGNOSIS — E113591 Type 2 diabetes mellitus with proliferative diabetic retinopathy without macular edema, right eye: Secondary | ICD-10-CM | POA: Diagnosis present

## 2021-10-17 DIAGNOSIS — L03119 Cellulitis of unspecified part of limb: Secondary | ICD-10-CM | POA: Diagnosis present

## 2021-10-17 DIAGNOSIS — G2581 Restless legs syndrome: Secondary | ICD-10-CM | POA: Diagnosis present

## 2021-10-17 DIAGNOSIS — I5032 Chronic diastolic (congestive) heart failure: Secondary | ICD-10-CM | POA: Diagnosis present

## 2021-10-17 DIAGNOSIS — Z91048 Other nonmedicinal substance allergy status: Secondary | ICD-10-CM

## 2021-10-17 DIAGNOSIS — L02512 Cutaneous abscess of left hand: Secondary | ICD-10-CM | POA: Diagnosis present

## 2021-10-17 DIAGNOSIS — Z794 Long term (current) use of insulin: Secondary | ICD-10-CM

## 2021-10-17 DIAGNOSIS — M109 Gout, unspecified: Secondary | ICD-10-CM | POA: Diagnosis present

## 2021-10-17 DIAGNOSIS — E113541 Type 2 diabetes mellitus with proliferative diabetic retinopathy with combined traction retinal detachment and rhegmatogenous retinal detachment, right eye: Secondary | ICD-10-CM | POA: Diagnosis present

## 2021-10-17 DIAGNOSIS — M5412 Radiculopathy, cervical region: Secondary | ICD-10-CM | POA: Diagnosis present

## 2021-10-17 DIAGNOSIS — Z86010 Personal history of colonic polyps: Secondary | ICD-10-CM

## 2021-10-17 DIAGNOSIS — Z992 Dependence on renal dialysis: Secondary | ICD-10-CM

## 2021-10-17 DIAGNOSIS — L02519 Cutaneous abscess of unspecified hand: Secondary | ICD-10-CM | POA: Diagnosis present

## 2021-10-17 DIAGNOSIS — Z885 Allergy status to narcotic agent status: Secondary | ICD-10-CM

## 2021-10-17 DIAGNOSIS — I959 Hypotension, unspecified: Secondary | ICD-10-CM | POA: Diagnosis not present

## 2021-10-17 DIAGNOSIS — R0789 Other chest pain: Secondary | ICD-10-CM | POA: Diagnosis not present

## 2021-10-17 DIAGNOSIS — I132 Hypertensive heart and chronic kidney disease with heart failure and with stage 5 chronic kidney disease, or end stage renal disease: Secondary | ICD-10-CM | POA: Diagnosis present

## 2021-10-17 DIAGNOSIS — E119 Type 2 diabetes mellitus without complications: Secondary | ICD-10-CM

## 2021-10-17 DIAGNOSIS — H548 Legal blindness, as defined in USA: Secondary | ICD-10-CM | POA: Diagnosis present

## 2021-10-17 DIAGNOSIS — D696 Thrombocytopenia, unspecified: Secondary | ICD-10-CM | POA: Diagnosis present

## 2021-10-17 DIAGNOSIS — Z6841 Body Mass Index (BMI) 40.0 and over, adult: Secondary | ICD-10-CM

## 2021-10-17 DIAGNOSIS — J449 Chronic obstructive pulmonary disease, unspecified: Secondary | ICD-10-CM | POA: Diagnosis present

## 2021-10-17 DIAGNOSIS — Z9841 Cataract extraction status, right eye: Secondary | ICD-10-CM

## 2021-10-17 DIAGNOSIS — H409 Unspecified glaucoma: Secondary | ICD-10-CM | POA: Diagnosis present

## 2021-10-17 DIAGNOSIS — I10 Essential (primary) hypertension: Secondary | ICD-10-CM | POA: Diagnosis present

## 2021-10-17 DIAGNOSIS — Z79899 Other long term (current) drug therapy: Secondary | ICD-10-CM

## 2021-10-17 DIAGNOSIS — Z9851 Tubal ligation status: Secondary | ICD-10-CM

## 2021-10-17 DIAGNOSIS — F1120 Opioid dependence, uncomplicated: Secondary | ICD-10-CM

## 2021-10-17 DIAGNOSIS — L03114 Cellulitis of left upper limb: Principal | ICD-10-CM

## 2021-10-17 LAB — COMPREHENSIVE METABOLIC PANEL
ALT: 9 U/L (ref 0–44)
AST: 10 U/L — ABNORMAL LOW (ref 15–41)
Albumin: 3.8 g/dL (ref 3.5–5.0)
Alkaline Phosphatase: 98 U/L (ref 38–126)
Anion gap: 14 (ref 5–15)
BUN: 41 mg/dL — ABNORMAL HIGH (ref 6–20)
CO2: 25 mmol/L (ref 22–32)
Calcium: 9.4 mg/dL (ref 8.9–10.3)
Chloride: 100 mmol/L (ref 98–111)
Creatinine, Ser: 10.74 mg/dL — ABNORMAL HIGH (ref 0.44–1.00)
GFR, Estimated: 4 mL/min — ABNORMAL LOW (ref >60)
Glucose, Bld: 94 mg/dL (ref 70–99)
Potassium: 4.6 mmol/L (ref 3.5–5.1)
Sodium: 139 mmol/L (ref 135–145)
Total Bilirubin: 0.6 mg/dL (ref 0.3–1.2)
Total Protein: 7.6 g/dL (ref 6.5–8.1)

## 2021-10-17 LAB — CBC WITH DIFFERENTIAL/PLATELET
Abs Immature Granulocytes: 0.02 10*3/uL (ref 0.00–0.07)
Basophils Absolute: 0 10*3/uL (ref 0.0–0.1)
Basophils Relative: 1 %
Eosinophils Absolute: 0.1 10*3/uL (ref 0.0–0.5)
Eosinophils Relative: 2 %
HCT: 30.4 % — ABNORMAL LOW (ref 36.0–46.0)
Hemoglobin: 9.1 g/dL — ABNORMAL LOW (ref 12.0–15.0)
Immature Granulocytes: 0 %
Lymphocytes Relative: 29 %
Lymphs Abs: 1.3 10*3/uL (ref 0.7–4.0)
MCH: 27.8 pg (ref 26.0–34.0)
MCHC: 29.9 g/dL — ABNORMAL LOW (ref 30.0–36.0)
MCV: 93 fL (ref 80.0–100.0)
Monocytes Absolute: 0.2 10*3/uL (ref 0.1–1.0)
Monocytes Relative: 5 %
Neutro Abs: 2.8 10*3/uL (ref 1.7–7.7)
Neutrophils Relative %: 63 %
Platelets: 148 10*3/uL — ABNORMAL LOW (ref 150–400)
RBC: 3.27 MIL/uL — ABNORMAL LOW (ref 3.87–5.11)
RDW: 15.8 % — ABNORMAL HIGH (ref 11.5–15.5)
WBC: 4.5 10*3/uL (ref 4.0–10.5)
nRBC: 0 % (ref 0.0–0.2)

## 2021-10-17 LAB — TROPONIN I (HIGH SENSITIVITY)
Troponin I (High Sensitivity): 6 ng/L (ref <18)
Troponin I (High Sensitivity): 6 ng/L (ref <18)

## 2021-10-17 LAB — SEDIMENTATION RATE: Sed Rate: 37 mm/hr — ABNORMAL HIGH (ref 0–22)

## 2021-10-17 LAB — C-REACTIVE PROTEIN: CRP: 1.1 mg/dL — ABNORMAL HIGH (ref <1.0)

## 2021-10-17 LAB — LACTIC ACID, PLASMA: Lactic Acid, Venous: 1 mmol/L (ref 0.5–1.9)

## 2021-10-17 LAB — GLUCOSE, CAPILLARY: Glucose-Capillary: 139 mg/dL — ABNORMAL HIGH (ref 70–99)

## 2021-10-17 MED ORDER — HEPARIN SODIUM (PORCINE) 5000 UNIT/ML IJ SOLN
5000.0000 [IU] | Freq: Three times a day (TID) | INTRAMUSCULAR | Status: DC
  Administered 2021-10-17 – 2021-10-19 (×6): 5000 [IU] via SUBCUTANEOUS
  Filled 2021-10-17 (×6): qty 1

## 2021-10-17 MED ORDER — ALBUTEROL SULFATE (2.5 MG/3ML) 0.083% IN NEBU: 2.5000 mg | INHALATION_SOLUTION | Freq: Two times a day (BID) | RESPIRATORY_TRACT | Status: DC | PRN

## 2021-10-17 MED ORDER — CALCIUM CARBONATE ANTACID 500 MG PO CHEW
3.0000 | CHEWABLE_TABLET | Freq: Three times a day (TID) | ORAL | Status: DC
  Administered 2021-10-18 (×2): 600 mg via ORAL
  Filled 2021-10-17 (×4): qty 3

## 2021-10-17 MED ORDER — HYDRALAZINE HCL 20 MG/ML IJ SOLN: 10.0000 mg | Freq: Four times a day (QID) | INTRAMUSCULAR | Status: DC | PRN

## 2021-10-17 MED ORDER — PAROXETINE HCL 20 MG PO TABS
20.0000 mg | ORAL_TABLET | Freq: Every evening | ORAL | Status: DC
Start: 2021-10-18 — End: 2021-10-19
  Administered 2021-10-18: 20 mg via ORAL
  Filled 2021-10-17: qty 1

## 2021-10-17 MED ORDER — HYDROMORPHONE HCL 1 MG/ML IJ SOLN
1.0000 mg | Freq: Once | INTRAMUSCULAR | Status: AC
  Administered 2021-10-17: 1 mg via INTRAVENOUS
  Filled 2021-10-17: qty 1

## 2021-10-17 MED ORDER — ACETAMINOPHEN 650 MG RE SUPP: 650.0000 mg | Freq: Four times a day (QID) | RECTAL | Status: DC | PRN

## 2021-10-17 MED ORDER — PANTOPRAZOLE SODIUM 40 MG PO TBEC
40.0000 mg | DELAYED_RELEASE_TABLET | Freq: Every day | ORAL | Status: DC
Start: 2021-10-17 — End: 2021-10-18
  Administered 2021-10-17 – 2021-10-18 (×2): 40 mg via ORAL
  Filled 2021-10-17 (×2): qty 1

## 2021-10-17 MED ORDER — OXYCODONE HCL 5 MG PO TABS
5.0000 mg | ORAL_TABLET | ORAL | Status: DC | PRN
  Administered 2021-10-17 – 2021-10-18 (×2): 5 mg via ORAL
  Filled 2021-10-17 (×2): qty 1

## 2021-10-17 MED ORDER — DORZOLAMIDE HCL-TIMOLOL MAL 2-0.5 % OP SOLN
1.0000 [drp] | Freq: Two times a day (BID) | OPHTHALMIC | Status: DC
  Administered 2021-10-17 – 2021-10-19 (×4): 1 [drp] via OPHTHALMIC
  Filled 2021-10-17 (×2): qty 10

## 2021-10-17 MED ORDER — SODIUM CHLORIDE 0.9 % IV SOLN
2.0000 g | INTRAVENOUS | Status: DC
  Administered 2021-10-18: 2 g via INTRAVENOUS
  Filled 2021-10-17: qty 20

## 2021-10-17 MED ORDER — LATANOPROST 0.005 % OP SOLN
1.0000 [drp] | Freq: Two times a day (BID) | OPHTHALMIC | Status: DC
  Administered 2021-10-17 – 2021-10-19 (×4): 1 [drp] via OPHTHALMIC
  Filled 2021-10-17 (×2): qty 2.5

## 2021-10-17 MED ORDER — LISINOPRIL 10 MG PO TABS
10.0000 mg | ORAL_TABLET | Freq: Every day | ORAL | Status: DC
  Administered 2021-10-17 – 2021-10-19 (×3): 10 mg via ORAL
  Filled 2021-10-17 (×3): qty 1

## 2021-10-17 MED ORDER — CALCIUM CARBONATE ANTACID 500 MG PO CHEW: 1.0000 | CHEWABLE_TABLET | ORAL | Status: DC

## 2021-10-17 MED ORDER — SODIUM CHLORIDE 0.9 % IV SOLN
2.0000 g | Freq: Once | INTRAVENOUS | Status: AC
  Administered 2021-10-17: 2 g via INTRAVENOUS
  Filled 2021-10-17: qty 20

## 2021-10-17 MED ORDER — ACETAMINOPHEN 325 MG PO TABS: 650.0000 mg | ORAL_TABLET | Freq: Four times a day (QID) | ORAL | Status: DC | PRN

## 2021-10-17 MED ORDER — CINACALCET HCL 30 MG PO TABS
60.0000 mg | ORAL_TABLET | Freq: Every day | ORAL | Status: DC
  Administered 2021-10-17 – 2021-10-19 (×3): 60 mg via ORAL
  Filled 2021-10-17 (×5): qty 2

## 2021-10-17 NOTE — Assessment & Plan Note (Signed)
Continue CPAP.  

## 2021-10-17 NOTE — ED Provider Notes (Signed)
?[facility] ?Provider Note ? ? ?CSN: [id number] ?Arrival date & time: 10/17/21  1607 ? ?  ? ?History ? ?No chief complaint on file. ? ? ?Martha [name] is a 56 y.o. female. ? ?HPI ?56 year old female with complex past medical history which includes bipolar disorder, CHF, diabetes, ESRD, blindness along with other chronic medical conditions presents with left hand pain.  She indicates she has been having thenar left hand pain and swelling for a little less than a week.  She is blind so she does not know if there has been color change.  She denies fevers.  When asked about chest pain she does endorse some on and off chest pain and shortness of breath for a couple days, feels a little like when she has had too much fluid.  She went to urgent care and was referred here.  Pain is severe and is not better with oxycodone. She missed dialysis yesterday. ? ?Home Medications ?Prior to Admission medications   ?Medication Sig Start Date End Date Taking? Authorizing Provider  ?albuterol (PROVENTIL) (2.5 MG/3ML) 0.083% nebulizer solution Take 2.5 mg by nebulization every 12 (twelve) hours as needed for wheezing or shortness of breath.    [provider]  ?ALPRAZolam Duanne Moron) 1 MG tablet Take 1 tablet (1 mg total) by mouth 4 (four) times daily as needed for anxiety. ?Patient taking differently: Take 1 mg by mouth 4 (four) times daily. 07/12/20   Nita Sells, MD  ?Artificial Tear Ointment (DRY EYES OP) Place 1 drop into the right eye 2 (two) times daily.    [provider]  ?Blood Glucose Monitoring Suppl (FORA V30A BLOOD GLUCOSE SYSTEM) [place]  09/22/20   [provider]  ?busPIRone (BUSPAR) 7.5 MG tablet Take 1 tablet (7.5 mg total) by mouth 2 (two) times daily. 07/12/20   Nita Sells, MD  ?calcium carbonate (TUMS EX) 750 MG chewable tablet Chew 1-3 tablets by mouth See admin instructions. Take 3 tablets with each meal & take 1 tablets with each snack    [provider]  ?cinacalcet (SENSIPAR) 60 MG tablet Take 60 mg by mouth daily.    [provider]  ?diphenoxylate-atropine (LOMOTIL) 2.5-0.025 MG tablet Take 1 tablet by mouth 4 (four) times daily. 10/17/20   [provider]  ?dorzolamide-timolol (COSOPT) 22.3-6.8 MG/ML ophthalmic solution Place 1 drop into the right eye in the morning and at bedtime. 06/01/20   [provider]  ?doxercalciferol (HECTOROL) 4 MCG/2ML injection Inject 0.5 mLs (1 mcg total) into the vein every Monday, Wednesday, and Friday with hemodialysis. 12/03/18   Sinda Du, MD  ?Imagene [place] PREMIUM V10 TEST test strip  09/22/20   [provider]  ?gabapentin (NEURONTIN) 100 MG capsule Take 1 capsule (100 mg total) by mouth at bedtime. ?Patient taking differently: Take 300 mg by mouth at bedtime. 07/12/20   Nita Sells, MD  ?HYDROcodone-acetaminophen (NORCO) 10-325 MG tablet Take 1 tablet by mouth 4 (four) times daily. 08/30/20   [provider]  ?insulin aspart (NOVOLOG) 100 UNIT/ML injection Inject 0-9 Units into the skin 3 (three) times daily with meals. ?Patient taking differently: Inject 4-7 Units into the skin 3 (three) times daily with meals. Sliding scale insulin 12/02/18   Sinda Du, MD  ?Insulin Glargine [facility]) 100 UNIT/ML Inject 30 Units into the skin at bedtime. 07/12/20   Nita Sells, MD  ?latanoprost (XALATAN) 0.005 % ophthalmic solution Place 1 drop into the right eye 2 (two) times daily. 08/22/20  [provider]  ?lidocaine-prilocaine (EMLA) cream Apply 1 application topically as needed (topical anesthesia for hemodialysis if Gebauers and Lidocaine injection are ineffective.). ?Patient taking differently: Apply 1 application topically daily as needed (dialysis.). 04/25/17   Murlean Iba, MD  ?omeprazole (PRILOSEC) 40 MG capsule Take 40 mg by mouth daily.    [provider]  ?PARoxetine (PAXIL) 20 MG tablet Take 1 tablet (20 mg total)  by mouth every evening. This is to prevent panic attacks ?Patient taking differently: Take 20 mg by mouth at bedtime. 07/12/20   Nita Sells, MD  ?prednisoLONE acetate (PRED FORTE) 1 % ophthalmic suspension Place 1 drop into the right eye 2 (two) times daily. 08/27/20   [provider]  ?pregabalin (LYRICA) 75 MG capsule Take 75 mg by mouth daily. 08/03/20   [provider]  ?sodium zirconium cyclosilicate (LOKELMA) 10 g PACK packet Take 10 g by mouth 3 (three) times daily. 09/27/20   Ulyses Amor, PA-C  ?SPS 15 GM/60ML suspension Take by mouth. 09/27/20   [provider]  ?   ? ?Allergies    ?Codeine and Tape   ? ?Review of Systems   ?Review of Systems  ?Constitutional:  Negative for fever.  ?Respiratory:  Positive for shortness of breath.   ?Cardiovascular:  Positive for chest pain.  ?Musculoskeletal:  Positive for arthralgias.  ? ?Physical Exam ?Updated Vital Signs ?BP (!) 187/92 (BP Location: Right Arm)   Pulse 65   Temp 97.8 ?F (36.6 ?C) (Oral)   Resp 17   Ht 5\' 7"  (1.702 m)   Wt (!) 145.7 kg   LMP 08/03/2008 (Exact Date)   SpO2 98%   BMI 50.31 kg/m?  ?Physical Exam ?Vitals and nursing note reviewed.  ?Constitutional:   ?   Appearance: She is well-developed. She is obese.  ?HENT:  ?   Head: Normocephalic and atraumatic.  ?Cardiovascular:  ?   Rate and Rhythm: Normal rate and regular rhythm.  ?   Heart sounds: Murmur heard.  ?Pulmonary:  ?   Effort: Pulmonary effort is normal.  ?   Breath sounds: Normal breath sounds.  ?Abdominal:  ?   Palpations: Abdomen is soft.  ?   Tenderness: There is no abdominal tenderness.  ?Musculoskeletal:  ?   Left hand: Swelling and tenderness present.  ?   Comments: On the hypothenar palm there is an area of erythema with warmth.  Within this to the lateral aspect there is also a firm nodule.  Does not feel like an abscess.  ?Skin: ?   General: Skin is warm and dry.  ?Neurological:  ?   Mental Status: She is alert.  ? ? ?ED Results /  Procedures / Treatments   ?Labs ?(all labs ordered are listed, but only abnormal results are displayed) ?Labs Reviewed  ?COMPREHENSIVE METABOLIC PANEL - Abnormal; Notable for the following components:  ?    Result Value  ? BUN 41 (*)   ? Creatinine, Ser 10.74 (*)   ? AST 10 (*)   ? GFR, Estimated 4 (*)   ? All other components within normal limits  ?CBC WITH DIFFERENTIAL/PLATELET - Abnormal; Notable for the following components:  ? RBC 3.27 (*)   ? Hemoglobin 9.1 (*)   ? HCT 30.4 (*)   ? MCHC 29.9 (*)   ? RDW 15.8 (*)   ? Platelets 148 (*)   ? All other components within normal limits  ?SEDIMENTATION RATE - Abnormal; Notable for the following components:  ?  Sed Rate 37 (*)   ? All other components within normal limits  ?CULTURE, BLOOD (ROUTINE X 2)  ?CULTURE, BLOOD (ROUTINE X 2)  ?LACTIC ACID, PLASMA  ?C-REACTIVE PROTEIN  ?HIV ANTIBODY (ROUTINE TESTING W REFLEX)  ?COMPREHENSIVE METABOLIC PANEL  ?CBC  ?APTT  ?MAGNESIUM  ?PHOSPHORUS  ?HEMOGLOBIN A1C  ?TROPONIN I (HIGH SENSITIVITY)  ?TROPONIN I (HIGH SENSITIVITY)  ? ? ?EKG ?None ? ?Radiology ?DG Chest 2 View ? ?Result Date: 10/17/2021 ?CLINICAL DATA:  Chest pain, dialysis EXAM: CHEST - 2 VIEW COMPARISON:  07/05/2020 FINDINGS: Frontal and lateral views of the chest demonstrate a stable cardiac silhouette. There is mild central vascular congestion without airspace disease, effusion, or pneumothorax. No acute bony abnormalities. IMPRESSION: 1. Mild central vascular congestion without overt edema. 2. Stable borderline cardiomegaly. Electronically Signed   By: Randa Ngo M.D.   On: 10/17/2021 18:44  ? ?DG Hand Complete Left ? ?Result Date: 10/17/2021 ?CLINICAL DATA:  Left hand pain, swelling EXAM: LEFT HAND - COMPLETE 3+ VIEW COMPARISON:  07/02/2020 FINDINGS: Frontal, oblique, and lateral views of the left hand are obtained. No acute fracture, subluxation, or dislocation. There is mild diffuse osteoarthritis, with joint space narrowing most pronounced at the first  metacarpophalangeal joint and throughout the distal interphalangeal joints. Mild soft tissue swelling about the left wrist. Diffuse atherosclerosis. IMPRESSION: 1. Osteoarthritis.  No acute bony abnormality. 2. Soft tissue swelli

## 2021-10-17 NOTE — Assessment & Plan Note (Addendum)
Continue Paxil ?PDMP reviewed--pt receives alprazolam 1 mg, #90 monthly ?Pt also receives Norco 10/325, #135 monthly ?

## 2021-10-17 NOTE — ED Triage Notes (Addendum)
Pt reports left hand pain x2 weeks. Pt denies any known injury and reports "dialysis nurse stated this was a cyst". Large purple contusion noted to left hand.  ?

## 2021-10-17 NOTE — Assessment & Plan Note (Addendum)
Start amlodipine 10 mg daily ?SBP running consistently 150-160s ?Continue IV hydralazine 10 mg every 6 hours as needed for SBP > 170 ? ?

## 2021-10-17 NOTE — Discharge Instructions (Signed)
You are in need of a higher level of care than we can provide in the urgent care setting.  This includes testing and imaging to look at thrombophlebitis versus signs of a more severe infection such as Janeway lesions versus Osler nodes of the hand especially in the context of you having chest pain and shortness of breath.  Please make sure that you go straight to the emergency room now. ?

## 2021-10-17 NOTE — H&P (Addendum)
?History and Physical  ? ? ?Patient: Martha [name] DDU:[id number] DOB: 1965-11-20 ?DOA: 10/17/2021 ?DOS: the patient was seen and examined on 10/17/2021 ?PCP: Emelia Loron, NP  ?Patient coming from: Home ? ?Chief Complaint: No chief complaint on file. ? ?HPI: Martha [name] is a 56 y.o. female with medical history significant of hypertension, ESRD on HD (MWF), T2 DM, degenerative disc disease, COPD, CHF, carpal tunnel syndrome, legally blind, bipolar, arthritis, anemia presents to the ED with complaint of left hand pain which has been going on since last 3 days, patient states that she developed a rash with swelling on the palmar surface of the left hand about 6 to 7 days ago, she initially thought she accidentally hit her hand on something since she is legally blind and ambulates with a walker, however, she started to get worried when the hand became painful about 3 days ago.  Patient went to an urgent care where there was concern for possible infective endocarditis and patient was sent to the ER for further evaluation and management. ?Patient also complained of burning sensation in midsternal area and some increased shortness of breath, both at rest and exertion.  She missed dialysis yesterday.  Patient lives alone and she has home health nurse that comes to the house daily. ? ?ED course: ?In the emergency department, vital signs were within normal range except for elevated BP at 161/82.  Work-up in the ED shows normocytic anemia, thrombocytopenia, BMP was only positive for elevated BUN/creatinine of 41/10.74, sed rate 37. ?Left hand x-ray showed osteoarthritis but no acute bony abnormality.  Soft tissue swelling about the left wrist. ?Chest x-ray showed mild central vascular congestion without overt edema.  Stable borderline cardiomegaly ?She was treated with IV ceftriaxone.  Hospitalist was asked to admit.  For further evaluation and management. ? ?  ?Review of Systems: As mentioned in the history of present  illness. All other systems reviewed and are negative. ?Past Medical History:  ?Diagnosis Date  ? Acid reflux   ? takes Tums  ? Anemia   ? Arthritis   ? Bipolar 1 disorder ([place])   ? Blindness of left eye   ? left eye removed  ? Carpal tunnel syndrome, bilateral   ? Cervical radiculopathy   ? CHF (congestive heart failure) ([place])   ? Chronic kidney disease   ? Stage 5- 01/25/17  ? Constipation   ? COPD (chronic obstructive pulmonary disease) ([place]) 2014  ? bronchitis  ? Degenerative disc disease, thoracic   ? Depression   ? Diabetes mellitus   ? Type II  ? Diabetic retinopathy ([place])   ? Dyspnea   ? when walking  ? End stage kidney disease ([place])   ? M, W, F [place]   ? Gout   ? Hypertension   ? Noncompliance with medication regimen   ? Noncompliance with medication regimen   ? Obesity (BMI 30-39.9)   ? OSA (obstructive sleep apnea)   ? cpap  ? Panic attack   ? RLS (restless legs syndrome)   ? ?Past Surgical History:  ?Procedure Laterality Date  ? AORTIC ARCH ANGIOGRAPHY N/A 09/11/2020  ? Procedure: AORTIC ARCH ANGIOGRAPHY;  Surgeon: Serafina Mitchell, MD;  Location: [facility];  Service: Cardiovascular;  Laterality: N/A;  ? AV FISTULA PLACEMENT Right 01/27/2017  ? Procedure: ARTERIOVENOUS (AV) FISTULA CREATION-RIGHT ARM;  Surgeon: Elam Dutch, MD;  Location: [facility];  Service: Vascular;  Laterality: Right;  ? AV FISTULA PLACEMENT Left 08/03/2018  ?  Procedure: LEFT ARTERIOVENOUS (AV) FISTULA CREATION;  Surgeon: Elam Dutch, MD;  Location: [facility];  Service: Vascular;  Laterality: Left;  ? BASCILIC VEIN TRANSPOSITION Left 04/26/2019  ? Procedure: SECOND STAGE BASILIC VEIN TRANSPOSITION LEFT ARM;  Surgeon: Elam Dutch, MD;  Location: [facility];  Service: Vascular;  Laterality: Left;  ? BIOPSY N/A 05/17/2013  ? Procedure: BIOPSY;  Surgeon: [name], MD;  Location: [facility];  Service: Endoscopy;  Laterality: N/A;  ? CARPAL TUNNEL RELEASE Left 11/25/2018  ? Procedure: LEFT CARPAL TUNNEL RELEASE;  Surgeon:  Charlotte Crumb, MD;  Location: [facility];  Service: Orthopedics;  Laterality: Left;  ? CATARACT EXTRACTION Right   ? CHOLECYSTECTOMY N/A 04/27/2014  ? Procedure: LAPAROSCOPIC CHOLECYSTECTOMY;  Surgeon: [name], MD;  Location: [facility];  Service: General;  Laterality: N/A;  ? COLONOSCOPY  06/06/2010  ? DJT:TSVXBL bleeding secondary to internal hemorrhoids but incomplete evaluation secondary to poor right colon prep/small rectal and sigmoid colon polyps (hyperplastic). PROPOFOL  ? COLONOSCOPY  May 2013  ? Dr. Brita Romp: 5 mm a descending colon polyp, hyperplastic. Adequate bowel prep.  ? ENDOMETRIAL ABLATION    ? ENUCLEATION Left 02/04/2018  ? ENUCLEATION WITH PLACEMENT OF IMPLANT LEFT EYE  ? ENUCLEATION Left 02/04/2018  ? Procedure: ENUCLEATION WITH PLACEMENT OF IMPLANT LEFT EYE;  Surgeon: Clista Bernhardt, MD;  Location: [facility];  Service: Ophthalmology;  Laterality: Left;  ? ESOPHAGOGASTRODUODENOSCOPY  06/06/2010  ? TJQ:[id number] erythema and edema of body of stomach, with sessile polypoid lesions. bx benign. no h.pylori  ? ESOPHAGOGASTRODUODENOSCOPY (EGD) WITH PROPOFOL N/A 05/17/2013  ? Dr. Oneida Alar: normal esophagus, moderate nodular gastritis, negative path, empiric Savary dilation  ? EYE SURGERY  07/2017  ? sx for glaucoma  ? FLEXIBLE SIGMOIDOSCOPY N/A 05/17/2013  ? Dr. Oneida Alar: moderate sized internal hemorrhoids  ? HEMORRHOID BANDING N/A 05/17/2013  ? Procedure: HEMORRHOID BANDING;  Surgeon: [name], MD;  [facility];  Service: Endoscopy;  Laterality: N/A;  2 bands placed  ? INCISION AND DRAINAGE ABSCESS Left 10/11/2013  ? Procedure: INCISION AND DRAINAGE AND DEBRIDEMENT LEFT BREAST  ABSCESS;  Surgeon: [name], MD;  Location: [facility];  Service: General;  Laterality: Left;  ? INSERTION OF DIALYSIS CATHETER N/A 06/08/2018  ? Procedure: INSERTION OF Right internal jugular TUNNELED  DIALYSIS CATHETER;  Surgeon: Angelia Mould, MD;  Location: [facility];  Service: Vascular;  Laterality:  N/A;  ? [place] Right 06/01/2013  ? Procedure: INCISION AND DRAINAGE AND DEBRIDEMENT ABSCESS RIGHT BREAST;  Surgeon: [name], MD;  [facility];  Service: General;  Laterality: Right;  ? LEFT EYE REMOVED Left 01/2018  ? [employer] on [employer]  ? LIGATION OF ARTERIOVENOUS  FISTULA Right 06/08/2018  ? Procedure: LIGATION OF ARTERIOVENOUS  FISTULA RIGHT ARM;  Surgeon: Angelia Mould, MD;  Location: [facility];  Service: Vascular;  Laterality: Right;  ? ORIF ANKLE FRACTURE Right 12/17/2018  ? Procedure: OPEN REDUCTION INTERNAL FIXATION (ORIF) ANKLE FRACTURE;  Surgeon: Edrick Kins, DPM;  Location: [facility];  Service: Podiatry;  Laterality: Right;  ? REVISON OF ARTERIOVENOUS FISTULA Left 09/26/2020  ? Procedure: LEFT ARTERIOVENOUS FISTULA  DISTAL REVASCULARIZATION AND LIGATION ( DRIL ) USING GREATER SAPHENOUS VEIN;  Surgeon: Waynetta Sandy, MD;  Location: [facility];  Service: Vascular;  Laterality: Left;  ? SAVORY DILATION N/A 05/17/2013  ? Procedure: SAVORY DILATION;  Surgeon: [name], MD;  Location[facility];  Service: Endoscopy;  Laterality: N/A;  14/15/16  ? SKIN FULL THICKNESS GRAFT Left 05/05/2018  ? Procedure: ABDOMINAL DERMIS FAT SKIN GRAFT FULL THICKNESS LEFT EYE;  Surgeon: Clista Bernhardt, MD;  Location: [facility];  Service: Ophthalmology;  Laterality: Left;  ? TUBAL LIGATION    ? VEIN HARVEST Left 09/26/2020  ? Procedure: LEFT LEG GREATER SAPHENOUS VEIN HARVEST;  Surgeon: Waynetta Sandy, MD;  Location: [facility];  Service: Vascular;  Laterality: Left;  ? ?Social History:  reports that she quit smoking about 13 years ago. Her smoking use included cigarettes. She has a 37.50 pack-year smoking history. She has never used smokeless tobacco. She reports that she does not drink alcohol and does not use drugs. ? ?Allergies  ?Allergen Reactions  ? Codeine Nausea And Vomiting  ? Tape Rash and Other (See Comments)  ?  Pull skin off.  Paper tape is  ok  ? ? ?Family History  ?Adopted: Yes  ?Family history unknown: Yes  ? ? ?Prior to Admission medications   ?Medication Sig Start Date End Date Taking? Authorizing Provider  ?albuterol (PROVENTIL) (2.5 MG/3

## 2021-10-17 NOTE — Assessment & Plan Note (Addendum)
Patient usually have HD on MWF, she missed yesterday's session due to not feeling well ?Continue Sensipar ?Nephrology consulted ?Received HD 3/24 ?

## 2021-10-17 NOTE — ED Notes (Addendum)
Patient is being discharged from the Urgent Care and sent to the Emergency Department via POV . Per PA, patient is in need of higher level of care due to risk factors for endocarditis/septic emboli. Patient is aware and verbalizes understanding of plan of care.  ?Vitals:  ? 10/17/21 1516  ?BP: (!) 161/82  ?Pulse: 77  ?Temp: 98.1 ?F (36.7 ?C)  ?SpO2: 95%  ?  ?

## 2021-10-17 NOTE — Assessment & Plan Note (Signed)
Continue Cosopt and Xalatan ?

## 2021-10-17 NOTE — Assessment & Plan Note (Addendum)
Hemoglobin A1c on 09/26/2020 was 5.0 ?3/34/23 A1C--5.4 ?

## 2021-10-17 NOTE — ED Provider Notes (Signed)
?[place] ? ? ?MRN: [id number] DOB: 05-Oct-1965 ? ?Subjective:  ? ?Martha [name] is a 56 y.o. female presenting for 2-week history of persistent and worsening left hand pain with bruising and swelling.  Patient reports that the pain is 10 out of 10.  She is also had warmth to the area.  Reports intermittent chest pain and shortness of breath.  Has a history of CHF, ESRD on dialysis thrice weekly.  She also is a type II diabetic treated with insulin.  No recent heart procedures, drug use, IV drugs.  No history of blood clots or clotting disorder.  She does have a history of CHF.  Patient is blind but cannot recall any specific trauma or falls. ? ? ?Current Facility-Administered Medications:  ?  0.9 %  sodium chloride infusion, 250 mL, Intravenous, PRN, Waynetta Sandy, MD ?  sodium chloride flush (NS) 0.9 % injection 3 mL, 3 mL, Intravenous, Q12H, Waynetta Sandy, MD ?  sodium chloride flush (NS) 0.9 % injection 3 mL, 3 mL, Intravenous, PRN, Waynetta Sandy, MD ? ?Current Outpatient Medications:  ?  albuterol (PROVENTIL) (2.5 MG/3ML) 0.083% nebulizer solution, Take 2.5 mg by nebulization every 12 (twelve) hours as needed for wheezing or shortness of breath., Disp: , Rfl:  ?  ALPRAZolam (XANAX) 1 MG tablet, Take 1 tablet (1 mg total) by mouth 4 (four) times daily as needed for anxiety. (Patient taking differently: Take 1 mg by mouth 4 (four) times daily.), Disp: 9 tablet, Rfl: 0 ?  Artificial Tear Ointment (DRY EYES OP), Place 1 drop into the right eye 2 (two) times daily., Disp: , Rfl:  ?  Blood Glucose Monitoring Suppl (FORA V30A BLOOD GLUCOSE SYSTEM) DEVI, , Disp: , Rfl:  ?  busPIRone (BUSPAR) 7.5 MG tablet, Take 1 tablet (7.5 mg total) by mouth 2 (two) times daily., Disp: 7 tablet, Rfl: 0 ?  calcium carbonate (TUMS EX) 750 MG chewable tablet, Chew 1-3 tablets by mouth See admin instructions. Take 3 tablets with each meal & take 1 tablets with each snack, Disp: ,  Rfl:  ?  cinacalcet (SENSIPAR) 60 MG tablet, Take 60 mg by mouth daily., Disp: , Rfl:  ?  diphenoxylate-atropine (LOMOTIL) 2.5-0.025 MG tablet, Take 1 tablet by mouth 4 (four) times daily., Disp: , Rfl:  ?  dorzolamide-timolol (COSOPT) 22.3-6.8 MG/ML ophthalmic solution, Place 1 drop into the right eye in the morning and at bedtime., Disp: , Rfl:  ?  doxercalciferol (HECTOROL) 4 MCG/2ML injection, Inject 0.5 mLs (1 mcg total) into the vein every Monday, Wednesday, and Friday with hemodialysis., Disp: 2 mL, Rfl:  ?  FORACARE PREMIUM V10 TEST test strip, , Disp: , Rfl:  ?  gabapentin (NEURONTIN) 100 MG capsule, Take 1 capsule (100 mg total) by mouth at bedtime. (Patient taking differently: Take 300 mg by mouth at bedtime.), Disp: 7 capsule, Rfl: 0 ?  HYDROcodone-acetaminophen (NORCO) 10-325 MG tablet, Take 1 tablet by mouth 4 (four) times daily., Disp: , Rfl:  ?  insulin aspart (NOVOLOG) 100 UNIT/ML injection, Inject 0-9 Units into the skin 3 (three) times daily with meals. (Patient taking differently: Inject 4-7 Units into the skin 3 (three) times daily with meals. Sliding scale insulin), Disp: 10 mL, Rfl: 11 ?  Insulin Glargine (BASAGLAR KWIKPEN) 100 UNIT/ML, Inject 30 Units into the skin at bedtime., Disp: 10 mL, Rfl: 0 ?  latanoprost (XALATAN) 0.005 % ophthalmic solution, Place 1 drop into the right eye 2 (two) times daily., Disp: ,  Rfl:  ?  lidocaine-prilocaine (EMLA) cream, Apply 1 application topically as needed (topical anesthesia for hemodialysis if Gebauers and Lidocaine injection are ineffective.). (Patient taking differently: Apply 1 application topically daily as needed (dialysis.).), Disp: 30 g, Rfl: 0 ?  omeprazole (PRILOSEC) 40 MG capsule, Take 40 mg by mouth daily., Disp: , Rfl:  ?  PARoxetine (PAXIL) 20 MG tablet, Take 1 tablet (20 mg total) by mouth every evening. This is to prevent panic attacks (Patient taking differently: Take 20 mg by mouth at bedtime.), Disp: 7 tablet, Rfl: 0 ?  prednisoLONE  acetate (PRED FORTE) 1 % ophthalmic suspension, Place 1 drop into the right eye 2 (two) times daily., Disp: , Rfl:  ?  pregabalin (LYRICA) 75 MG capsule, Take 75 mg by mouth daily., Disp: , Rfl:  ?  sodium zirconium cyclosilicate (LOKELMA) 10 g PACK packet, Take 10 g by mouth 3 (three) times daily., Disp: 2 packet, Rfl: 0 ?  SPS 15 GM/60ML suspension, Take by mouth., Disp: , Rfl:   ? ?Allergies  ?Allergen Reactions  ? Codeine Nausea And Vomiting  ? Tape Rash and Other (See Comments)  ?  Pull skin off.  Paper tape is ok  ? ? ?Past Medical History:  ?Diagnosis Date  ? Acid reflux   ? takes Tums  ? Anemia   ? Arthritis   ? Bipolar 1 disorder ([place])   ? Blindness of left eye   ? left eye removed  ? Carpal tunnel syndrome, bilateral   ? Cervical radiculopathy   ? CHF (congestive heart failure) ([place])   ? Chronic kidney disease   ? Stage 5- 01/25/17  ? Constipation   ? COPD (chronic obstructive pulmonary disease) ([place]) 2014  ? bronchitis  ? Degenerative disc disease, thoracic   ? Depression   ? Diabetes mellitus   ? Type II  ? Diabetic retinopathy ([place])   ? Dyspnea   ? when walking  ? End stage kidney disease ([place])   ? M, W, F [place]   ? Gout   ? Hypertension   ? Noncompliance with medication regimen   ? Noncompliance with medication regimen   ? Obesity (BMI 30-39.9)   ? OSA (obstructive sleep apnea)   ? cpap  ? Panic attack   ? RLS (restless legs syndrome)   ?  ? ?Past Surgical History:  ?Procedure Laterality Date  ? AORTIC ARCH ANGIOGRAPHY N/A 09/11/2020  ? Procedure: AORTIC ARCH ANGIOGRAPHY;  Surgeon: Serafina Mitchell, MD;  Location: [facility];  Service: Cardiovascular;  Laterality: N/A;  ? AV FISTULA PLACEMENT Right 01/27/2017  ? Procedure: ARTERIOVENOUS (AV) FISTULA CREATION-RIGHT ARM;  Surgeon: Elam Dutch, MD;  Location: [facility];  Service: Vascular;  Laterality: Right;  ? AV FISTULA PLACEMENT Left 08/03/2018  ? Procedure: LEFT ARTERIOVENOUS (AV) FISTULA CREATION;  Surgeon: Elam Dutch, MD;   Location: [facility];  Service: Vascular;  Laterality: Left;  ? BASCILIC VEIN TRANSPOSITION Left 04/26/2019  ? Procedure: SECOND STAGE BASILIC VEIN TRANSPOSITION LEFT ARM;  Surgeon: Elam Dutch, MD;  Location: [facility];  Service: Vascular;  Laterality: Left;  ? BIOPSY N/A 05/17/2013  ? Procedure: BIOPSY;  Surgeon: [name], MD;  Location: [facility];  Service: Endoscopy;  Laterality: N/A;  ? CARPAL TUNNEL RELEASE Left 11/25/2018  ? Procedure: LEFT CARPAL TUNNEL RELEASE;  Surgeon: Charlotte Crumb, MD;  Location: [facility];  Service: Orthopedics;  Laterality: Left;  ? CATARACT EXTRACTION Right   ? CHOLECYSTECTOMY N/A 04/27/2014  ?  Procedure: LAPAROSCOPIC CHOLECYSTECTOMY;  Surgeon: [name], MD;  Location: [facility];  Service: General;  Laterality: N/A;  ? COLONOSCOPY  06/06/2010  ? FHQ:RFXJOI bleeding secondary to internal hemorrhoids but incomplete evaluation secondary to poor right colon prep/small rectal and sigmoid colon polyps (hyperplastic). PROPOFOL  ? COLONOSCOPY  May 2013  ? Dr. Brita Romp: 5 mm a descending colon polyp, hyperplastic. Adequate bowel prep.  ? ENDOMETRIAL ABLATION    ? ENUCLEATION Left 02/04/2018  ? ENUCLEATION WITH PLACEMENT OF IMPLANT LEFT EYE  ? ENUCLEATION Left 02/04/2018  ? Procedure: ENUCLEATION WITH PLACEMENT OF IMPLANT LEFT EYE;  Surgeon: Clista Bernhardt, MD;  Location: [facility];  Service: Ophthalmology;  Laterality: Left;  ? ESOPHAGOGASTRODUODENOSCOPY  06/06/2010  ? TGP:QDIYMEB erythema and edema of body of stomach, with sessile polypoid lesions. bx benign. no h.pylori  ? ESOPHAGOGASTRODUODENOSCOPY (EGD) WITH PROPOFOL N/A 05/17/2013  ? Dr. Oneida Alar: normal esophagus, moderate nodular gastritis, negative path, empiric Savary dilation  ? EYE SURGERY  07/2017  ? sx for glaucoma  ? FLEXIBLE SIGMOIDOSCOPY N/A 05/17/2013  ? Dr. Oneida Alar: moderate sized internal hemorrhoids  ? HEMORRHOID BANDING N/A 05/17/2013  ? Procedure: HEMORRHOID BANDING;  Surgeon: [name], MD;  Location: [facility];   Service: Endoscopy;  Laterality: N/A;  2 bands placed  ? INCISION AND DRAINAGE ABSCESS Left 10/11/2013  ? Procedure: INCISION AND DRAINAGE AND DEBRIDEMENT LEFT BREAST  ABSCESS;  Surgeon: [name], MD;

## 2021-10-17 NOTE — Assessment & Plan Note (Addendum)
BMI 50.58 ?Continue diet and lifestyle modification ?

## 2021-10-17 NOTE — ED Notes (Signed)
Pt brought food from family member ?

## 2021-10-17 NOTE — Assessment & Plan Note (Signed)
Continue Tums and Protonix ?

## 2021-10-17 NOTE — Assessment & Plan Note (Addendum)
Suspicion for cellulitis is low, but continue empiric IV antibiotics for now ?Concerned about early changes for calciphylaxis>>out pt referral to dermatology for biopsy ?After 48 hours abx, pain is improving ?Suspicion for endocarditis remains very low ?ESR 37, CRP 1.1 ?Echo--EF 60-65%, no WMA, G2DD, no vegetations or source of thrombi ?Check calcium and phosphorus,  ?intact PTH--296 ?Check rheumatoid factor--neg ?-anti-CCP--pending at time d/c ?ANA--pending at time of d/c ?Check complements-normal ?Check SSA --mildly positive ?SSB antibodies--neg ?Check anti-dsDNA--pending at time of dc ?D-dimer 0.62--essentially normal for her age ?

## 2021-10-17 NOTE — ED Notes (Signed)
Pt asked about CPAP. Provider is aware ?

## 2021-10-17 NOTE — Assessment & Plan Note (Addendum)
Tender, erythematous rash of palmar side of hand which is warm to touch. ?Left hand x-ray showed soft tissue swelling about the left wrist. ?Patient was started on IV ceftriaxone initially ?There was concern for infective endocarditis due to patient's rash being similar to Less likely [place] lesions (nonpainful), but patient's hand rash was painful,  ?--she has no Osler nodes or roth spots ?Patient does not meet with modified [facility] criteria 1 and 2 at this time ?Blood culture will be obtained with plan to possibly do an echocardiogram based on blood culture findings ? ?

## 2021-10-17 NOTE — ED Triage Notes (Signed)
SWELLING AREA LEFT HAND FOR OVER A WEEK ?

## 2021-10-18 DIAGNOSIS — I5032 Chronic diastolic (congestive) heart failure: Secondary | ICD-10-CM | POA: Diagnosis present

## 2021-10-18 DIAGNOSIS — J449 Chronic obstructive pulmonary disease, unspecified: Secondary | ICD-10-CM | POA: Diagnosis present

## 2021-10-18 DIAGNOSIS — Z794 Long term (current) use of insulin: Secondary | ICD-10-CM | POA: Diagnosis not present

## 2021-10-18 DIAGNOSIS — M109 Gout, unspecified: Secondary | ICD-10-CM | POA: Diagnosis present

## 2021-10-18 DIAGNOSIS — E113599 Type 2 diabetes mellitus with proliferative diabetic retinopathy without macular edema, unspecified eye: Secondary | ICD-10-CM | POA: Diagnosis present

## 2021-10-18 DIAGNOSIS — Z6841 Body Mass Index (BMI) 40.0 and over, adult: Secondary | ICD-10-CM | POA: Diagnosis not present

## 2021-10-18 DIAGNOSIS — K219 Gastro-esophageal reflux disease without esophagitis: Secondary | ICD-10-CM | POA: Diagnosis present

## 2021-10-18 DIAGNOSIS — H548 Legal blindness, as defined in USA: Secondary | ICD-10-CM | POA: Diagnosis present

## 2021-10-18 DIAGNOSIS — L03114 Cellulitis of left upper limb: Secondary | ICD-10-CM | POA: Diagnosis not present

## 2021-10-18 DIAGNOSIS — Z79899 Other long term (current) drug therapy: Secondary | ICD-10-CM | POA: Diagnosis not present

## 2021-10-18 DIAGNOSIS — L02512 Cutaneous abscess of left hand: Secondary | ICD-10-CM | POA: Diagnosis present

## 2021-10-18 DIAGNOSIS — G4733 Obstructive sleep apnea (adult) (pediatric): Secondary | ICD-10-CM | POA: Diagnosis present

## 2021-10-18 DIAGNOSIS — L03119 Cellulitis of unspecified part of limb: Secondary | ICD-10-CM | POA: Diagnosis not present

## 2021-10-18 DIAGNOSIS — D696 Thrombocytopenia, unspecified: Secondary | ICD-10-CM | POA: Diagnosis present

## 2021-10-18 DIAGNOSIS — N186 End stage renal disease: Secondary | ICD-10-CM

## 2021-10-18 DIAGNOSIS — I959 Hypotension, unspecified: Secondary | ICD-10-CM | POA: Diagnosis not present

## 2021-10-18 DIAGNOSIS — M79642 Pain in left hand: Secondary | ICD-10-CM | POA: Diagnosis present

## 2021-10-18 DIAGNOSIS — F319 Bipolar disorder, unspecified: Secondary | ICD-10-CM | POA: Diagnosis present

## 2021-10-18 DIAGNOSIS — I132 Hypertensive heart and chronic kidney disease with heart failure and with stage 5 chronic kidney disease, or end stage renal disease: Secondary | ICD-10-CM | POA: Diagnosis present

## 2021-10-18 DIAGNOSIS — G2581 Restless legs syndrome: Secondary | ICD-10-CM | POA: Diagnosis present

## 2021-10-18 DIAGNOSIS — I5030 Unspecified diastolic (congestive) heart failure: Secondary | ICD-10-CM | POA: Diagnosis not present

## 2021-10-18 DIAGNOSIS — E113591 Type 2 diabetes mellitus with proliferative diabetic retinopathy without macular edema, right eye: Secondary | ICD-10-CM | POA: Diagnosis present

## 2021-10-18 DIAGNOSIS — E1122 Type 2 diabetes mellitus with diabetic chronic kidney disease: Secondary | ICD-10-CM | POA: Diagnosis present

## 2021-10-18 DIAGNOSIS — D649 Anemia, unspecified: Secondary | ICD-10-CM | POA: Diagnosis present

## 2021-10-18 DIAGNOSIS — Z992 Dependence on renal dialysis: Secondary | ICD-10-CM | POA: Diagnosis not present

## 2021-10-18 DIAGNOSIS — R079 Chest pain, unspecified: Secondary | ICD-10-CM | POA: Diagnosis not present

## 2021-10-18 DIAGNOSIS — F1120 Opioid dependence, uncomplicated: Secondary | ICD-10-CM

## 2021-10-18 DIAGNOSIS — M5412 Radiculopathy, cervical region: Secondary | ICD-10-CM | POA: Diagnosis present

## 2021-10-18 DIAGNOSIS — H409 Unspecified glaucoma: Secondary | ICD-10-CM | POA: Diagnosis present

## 2021-10-18 LAB — CBC
HCT: 28.5 % — ABNORMAL LOW (ref 36.0–46.0)
Hemoglobin: 8.3 g/dL — ABNORMAL LOW (ref 12.0–15.0)
MCH: 27.1 pg (ref 26.0–34.0)
MCHC: 29.1 g/dL — ABNORMAL LOW (ref 30.0–36.0)
MCV: 93.1 fL (ref 80.0–100.0)
Platelets: 141 10*3/uL — ABNORMAL LOW (ref 150–400)
RBC: 3.06 MIL/uL — ABNORMAL LOW (ref 3.87–5.11)
RDW: 15.9 % — ABNORMAL HIGH (ref 11.5–15.5)
WBC: 4.1 10*3/uL (ref 4.0–10.5)
nRBC: 0 % (ref 0.0–0.2)

## 2021-10-18 LAB — HEMOGLOBIN A1C
Hgb A1c MFr Bld: 5.4 % (ref 4.8–5.6)
Mean Plasma Glucose: 108.28 mg/dL

## 2021-10-18 LAB — D-DIMER, QUANTITATIVE: D-Dimer, Quant: 0.62 ug/mL-FEU — ABNORMAL HIGH (ref 0.00–0.50)

## 2021-10-18 LAB — COMPREHENSIVE METABOLIC PANEL
ALT: 8 U/L (ref 0–44)
AST: 5 U/L — ABNORMAL LOW (ref 15–41)
Albumin: 3.4 g/dL — ABNORMAL LOW (ref 3.5–5.0)
Alkaline Phosphatase: 91 U/L (ref 38–126)
Anion gap: 14 (ref 5–15)
BUN: 44 mg/dL — ABNORMAL HIGH (ref 6–20)
CO2: 24 mmol/L (ref 22–32)
Calcium: 9.2 mg/dL (ref 8.9–10.3)
Chloride: 103 mmol/L (ref 98–111)
Creatinine, Ser: 11.33 mg/dL — ABNORMAL HIGH (ref 0.44–1.00)
GFR, Estimated: 4 mL/min — ABNORMAL LOW (ref >60)
Glucose, Bld: 108 mg/dL — ABNORMAL HIGH (ref 70–99)
Potassium: 4.3 mmol/L (ref 3.5–5.1)
Sodium: 141 mmol/L (ref 135–145)
Total Bilirubin: 0.4 mg/dL (ref 0.3–1.2)
Total Protein: 6.8 g/dL (ref 6.5–8.1)

## 2021-10-18 LAB — APTT: aPTT: 31 seconds (ref 24–36)

## 2021-10-18 LAB — LIPASE, BLOOD: Lipase: 30 U/L (ref 11–51)

## 2021-10-18 LAB — HEPATITIS B SURFACE ANTIGEN: Hepatitis B Surface Ag: NONREACTIVE

## 2021-10-18 LAB — PHOSPHORUS: Phosphorus: 6.1 mg/dL — ABNORMAL HIGH (ref 2.5–4.6)

## 2021-10-18 LAB — HIV ANTIBODY (ROUTINE TESTING W REFLEX): HIV Screen 4th Generation wRfx: NONREACTIVE

## 2021-10-18 LAB — HEPATITIS B SURFACE ANTIBODY,QUALITATIVE: Hep B S Ab: REACTIVE — AB

## 2021-10-18 LAB — MAGNESIUM: Magnesium: 2.2 mg/dL (ref 1.7–2.4)

## 2021-10-18 MED ORDER — METOCLOPRAMIDE HCL 5 MG/ML IJ SOLN
5.0000 mg | Freq: Once | INTRAMUSCULAR | Status: DC
Start: 2021-10-18 — End: 2021-10-19
  Filled 2021-10-18: qty 2

## 2021-10-18 MED ORDER — PENTAFLUOROPROP-TETRAFLUOROETH EX AERO
1.0000 "application " | INHALATION_SPRAY | CUTANEOUS | Status: DC | PRN
  Filled 2021-10-18: qty 30

## 2021-10-18 MED ORDER — SODIUM CHLORIDE 0.9 % IV SOLN: 100.0000 mL | INTRAVENOUS | Status: DC | PRN

## 2021-10-18 MED ORDER — DARBEPOETIN ALFA 100 MCG/0.5ML IJ SOSY
PREFILLED_SYRINGE | INTRAMUSCULAR | Status: AC
  Administered 2021-10-18: 100 ug via INTRAVENOUS
  Filled 2021-10-18: qty 0.5

## 2021-10-18 MED ORDER — LIDOCAINE-PRILOCAINE 2.5-2.5 % EX CREA: 1.0000 "application " | TOPICAL_CREAM | CUTANEOUS | Status: DC | PRN

## 2021-10-18 MED ORDER — DARBEPOETIN ALFA 100 MCG/0.5ML IJ SOSY
100.0000 ug | PREFILLED_SYRINGE | INTRAMUSCULAR | Status: DC
  Filled 2021-10-18: qty 0.5

## 2021-10-18 MED ORDER — ONDANSETRON HCL 4 MG/2ML IJ SOLN
4.0000 mg | Freq: Four times a day (QID) | INTRAMUSCULAR | Status: DC | PRN
Start: 2021-10-18 — End: 2021-10-19
  Administered 2021-10-18 (×3): 4 mg via INTRAVENOUS
  Filled 2021-10-18 (×4): qty 2

## 2021-10-18 MED ORDER — LOPERAMIDE HCL 2 MG PO CAPS
4.0000 mg | ORAL_CAPSULE | Freq: Once | ORAL | Status: AC
  Administered 2021-10-18: 4 mg via ORAL

## 2021-10-18 MED ORDER — HYDROMORPHONE HCL 1 MG/ML IJ SOLN
0.5000 mg | Freq: Once | INTRAMUSCULAR | Status: AC
  Administered 2021-10-18: 0.5 mg via INTRAVENOUS
  Filled 2021-10-18: qty 0.5

## 2021-10-18 MED ORDER — DARBEPOETIN ALFA 100 MCG/0.5ML IJ SOSY: 100.0000 ug | PREFILLED_SYRINGE | INTRAMUSCULAR | Status: DC

## 2021-10-18 MED ORDER — PANTOPRAZOLE SODIUM 40 MG IV SOLR
40.0000 mg | Freq: Two times a day (BID) | INTRAVENOUS | Status: DC
  Administered 2021-10-18: 40 mg via INTRAVENOUS
  Filled 2021-10-18 (×2): qty 10

## 2021-10-18 MED ORDER — PROCHLORPERAZINE EDISYLATE 10 MG/2ML IJ SOLN
10.0000 mg | Freq: Four times a day (QID) | INTRAMUSCULAR | Status: DC | PRN
  Administered 2021-10-18 – 2021-10-19 (×3): 10 mg via INTRAVENOUS
  Filled 2021-10-18 (×4): qty 2

## 2021-10-18 MED ORDER — CHLORHEXIDINE GLUCONATE CLOTH 2 % EX PADS: 6.0000 | MEDICATED_PAD | Freq: Every day | CUTANEOUS | Status: DC

## 2021-10-18 MED ORDER — SODIUM CHLORIDE 0.9 % IV SOLN
100.0000 mL | INTRAVENOUS | Status: DC | PRN
Start: 2021-10-18 — End: 2021-10-19

## 2021-10-18 MED ORDER — CALCITRIOL 0.25 MCG PO CAPS: 1.7500 ug | ORAL_CAPSULE | ORAL | Status: DC

## 2021-10-18 MED ORDER — LIDOCAINE HCL (PF) 1 % IJ SOLN: 5.0000 mL | INTRAMUSCULAR | Status: DC | PRN

## 2021-10-18 MED ORDER — HYDROMORPHONE HCL 1 MG/ML IJ SOLN
0.5000 mg | INTRAMUSCULAR | Status: DC | PRN
  Filled 2021-10-18: qty 0.5

## 2021-10-18 NOTE — Hospital Course (Addendum)
56 year old female with a history of ESRD, bipolar disorder, diastolic CHF, and hypertension presenting with left hand pain x 3 days.  The patient states that she first noticed her left hand pain on dialysis on 10/14/2021.  She denies any recent injury or falls.  She has had some subjective chills but denies any fevers.  She denies any new medications.  She states that she has been compliant with dialysis, but she did miss dialysis on 10/16/2021 because she was not feeling well.  She stated that she had some nausea, heartburn sensation, and lightheadedness on that day.  She states that this has since resolved.  She denies any shortness of breath, coughing, hemoptysis, vomiting, diarrhea, abdominal pain.  She went to urgent care on 10/17/2021 for her left hand pain.  There was concern that her hand may have been a manifestation of an infectious process.  As result, the patient was sent to the emergency department for further evaluation and treatment. ?In the ED, the patient has been afebrile and hemodynamically stable with oxygen saturation 97-99% on room air.  WBC 4.5, hemoglobin 9.1, platelets 140,000.  Sodium 139, potassium 4.6, bicarbonate 25, BUN 41, creatinine 10.74.  LFTs were unremarkable.  CRP was 1.1.  Calcium was 9.4, ESR 37.  Troponin 6>> 6.  The patient was started on ceftriaxone after blood cultures were obtained.  She was admitted for further evaluation and treatment of her left hand. ? ?Overall her hand pain improved, but discoloration continued to progress over the left hypothenar eminence from violaceous color to purplish ecchymosis. She will go home with cefdinir x 5 more days to complete 7 day course.  Blood cultures and echo were reassuring.  She will need dermatology follow up for opinion and biopsy of her left hypothenar eminence. ?She developed n/v felt to be due to oxycodone.  This was d/ced and pt given antiemetics with improvement.  She will go home back on her usual dose of hydrocodone. ?

## 2021-10-18 NOTE — TOC Initial Note (Addendum)
Transition of Care (TOC) - Initial/Assessment Note  ? ? ?Patient Details  ?Name: Martha [name] ?MRN: [id number] ?Date of Birth: Feb 19, 1966 ? ?Transition of Care (TOC) CM/SW Contact:    ?Boneta Lucks, RN ?Phone Number: ?10/18/2021, 12:05 PM ? ?Clinical Narrative:    Patient admitted with hypertension and            ?Cellulitis of left hand. Patient lives alone legally blind, morbid obesity. She states she has an aide 1Pm-9PM M-F and 2 1/2 hours a day on weekends. Patient lives in an apartment has a ramp, her rolling walker is broken has not brakes, her wheelchair is also broken. TOC referred to [employer], order have been placed. Melissa with [employer] accepted the referral. She will see if she qualifies for Bariatric equipment.   ? ?Expected Discharge Plan: Home/Self Care ?Barriers to Discharge: Continued Medical Work up ? ?Expected Discharge Plan: Home/Self Care ?Barriers to Discharge: Continued Medical Work up ? ? ?Patient Goals and CMS Choice ?Patient states their goals for this hospitalization and ongoing recovery are:: to go home. ?CMS Medicare.gov Compare Post Acute Care list provided to:: Patient ?Choice offered to / list presented to : Patient ? ?Expected Discharge Plan and Services ?Expected Discharge Plan: Home/Self Care ?   ?  ?Living arrangements for the past 2 months: Apartment ?                ?DME Arranged: Walker rolling, Wheelchair manual ?DME Agency: [employer] ?Date DME Agency Contacted: 10/18/21 ?Time DME Agency Contacted: [phone] ?Representative spoke with at DME Agency: Melissa ?  ?Prior Living Arrangements/Services ?Living arrangements for the past 2 months: Apartment ?Lives with:: Self ?  ?        ?Current home services: DME ?  ?Activities of Daily Living ?Home Assistive Devices/Equipment: [place] Rile (specify type) (rolling) ?ADL Screening (condition at time of admission) ?Patient's cognitive ability adequate to safely complete daily activities?: No ?Is the patient deaf or have difficulty hearing?:  No ?Does the patient have difficulty seeing, even when wearing glasses/contacts?: Yes ?Does the patient have difficulty concentrating, remembering, or making decisions?: No ?Patient able to express need for assistance with ADLs?: Yes ?Does the patient have difficulty dressing or bathing?: No ?Independently performs ADLs?: Yes (appropriate for developmental age) ?Does the patient have difficulty walking or climbing stairs?: Yes ?Weakness of Legs: Both ?Weakness of Arms/Hands: None ?  ?Affect (typically observed): Accepting ?Orientation: : Oriented to Self, Oriented to Place, Oriented to  Time, Oriented to Situation ?Alcohol / Substance Use: Not Applicable ?Psych Involvement: No (comment) ? ?Admission diagnosis:  Left hand pain [M79.642] ?Cellulitis of left hand [L03.114] ?Cellulitis and abscess of hand [L03.119, L02.519] ?Patient Active Problem List  ? Diagnosis Date Noted  ? Opioid dependence ([place]) 10/18/2021  ? Cellulitis and abscess of hand 10/18/2021  ? Left hand pain 10/17/2021  ? Cellulitis of left hand 10/17/2021  ? ESRD (end stage renal disease) ([place]) 09/26/2020  ? Hypoalbuminemia 07/06/2020  ? Acute respiratory failure with hypoxia ([place]) 07/06/2020  ? CAP (community acquired pneumonia) 07/05/2020  ? Trimalleolar fracture of ankle, closed, right, initial encounter 12/17/2018  ? Pain and swelling of ankle, right 12/16/2018  ? Metabolic encephalopathy 85/46/2703  ? SIRS (systemic inflammatory response syndrome) ([place]) 06/23/2018  ? UTI (urinary tract infection) 02/26/2018  ? Volume overload 02/25/2018  ? 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 Dialysis patient [facility]) 12/13/2017  ? ESRD (end stage renal disease) on dialysis [facility])   ? Chronic diarrhea 10/08/2017  ?  Anemia in chronic kidney disease 05/24/2017  ? Chronic obstructive pulmonary disease ([place]) 05/24/2017  ? Heart failure ([place]) 05/24/2017  ? 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 Renal failure (ARF), acute  on chronic ([place]) 04/22/2017  ? Anasarca associated with disorder of kidney 04/20/2017  ? Protein-calorie malnutrition, severe 11/29/2016  ? HTN (hypertension), malignant 11/20/2016  ? Bipolar 1 disorder ([place]) 11/20/2016  ? Noncompliance with medication regimen 11/20/2016  ? Panic attack 11/20/2016  ? Controlled type 2 diabetes mellitus without complication, without long-term current use of insulin ([place]) 10/27/2016  ? Sensory disturbance 10/27/2016  ? Left hemiparesis ([place]) 10/27/2016  ? Cholelithiasis 04/26/2014  ? Hyponatremia 04/26/2014  ? Gallstones 04/06/2014  ? Diabetic ulcer of left great toe ([place]) 10/09/2013  ? Type 2 diabetes mellitus treated with insulin ([place]) 05/30/2013  ? Hyperglycemia due to diabetes mellitus ([place]) 05/30/2013  ? Internal hemorrhoids with other complication 86/38/1771  ? Umbilical hernia without mention of obstruction or gangrene 04/15/2013  ? Anemia 04/15/2013  ? Obesity, Class III, BMI 40-49.9 (morbid obesity) ([place]) 10/08/2012  ? DM 05/13/2010  ? Hyperlipidemia 05/13/2010  ? Morbid obesity ([place]) 05/13/2010  ? Depression with anxiety 05/13/2010  ? RESTLESS LEG SYNDROME 05/13/2010  ? Essential hypertension 05/13/2010  ? GERD 05/13/2010  ? RECTAL BLEEDING 05/13/2010  ? OSA on CPAP 05/13/2010  ? Dysphagia, oropharyngeal phase 05/13/2010  ? ?PCP:  Emelia Loron, NP ?Pharmacy:   ?[facility] ?[address] ?[place] [address] ?Phone: [phone] Fax: [phone] ? ? ? ?Readmission Risk Interventions ? ?  10/18/2021  ? 12:12 PM 07/09/2020  ?  3:25 PM  ?Readmission Risk Prevention Plan  ?Transportation Screening Complete Complete  ?[place] or Home Care Consult Complete   ?Social Work Consult for [place] Planning/Counseling Complete   ?Palliative Care Screening Not Applicable   ?Medication Review Press photographer) Complete Complete  ?PCP or Specialist appointment within 3-5 days of discharge  Not Complete  ?PCP/Specialist Appt Not Complete  comments  Discharging to SNF  ?[place] or Home Care Consult  Complete  ?SW Recovery Care/Counseling Consult  Complete  ?Palliative Care Screening  Not Applicable  ?Skilled Nursing Facility  Complete  ? ? ? ?

## 2021-10-18 NOTE — Progress Notes (Signed)
Pt checks CBG AC/HS, is on novolog insulin sliding scale 3xday and 30 units of insulin glargine at HS. Pt's [id number]. Dr. Josephine Cables notified and informed this nurse that last HGB A1C was 5 and would like to see what next lab will show before placing on insulin regimen.  ?

## 2021-10-18 NOTE — Progress Notes (Signed)
?  ?       ?PROGRESS NOTE ? ?Martha [name] WPY:[id number] DOB: 1965-09-01 DOA: 10/17/2021 ?PCP: Emelia Loron, NP ? ?Brief History:  ?56 year old female with a history of ESRD, bipolar disorder, diastolic CHF, and hypertension presenting with left hand pain x 3 days.  The patient states that she first noticed her left hand pain on dialysis on 10/14/2021.  She denies any recent injury or falls.  She has had some subjective chills but denies any fevers.  She denies any new medications.  She states that she has been compliant with dialysis, but she did miss dialysis on 10/16/2021 because she was not feeling well.  She stated that she had some nausea, heartburn sensation, and lightheadedness on that day.  She states that this has since resolved.  She denies any shortness of breath, coughing, hemoptysis, vomiting, diarrhea, abdominal pain.  She went to urgent care on 10/17/2021 for her left hand pain.  There was concern that her hand may have been a manifestation of an infectious process.  As result, the patient was sent to the emergency department for further evaluation and treatment. ?In the ED, the patient has been afebrile and hemodynamically stable with oxygen saturation 97-99% on room air.  WBC 4.5, hemoglobin 9.1, platelets 140,000.  Sodium 139, potassium 4.6, bicarbonate 25, BUN 41, creatinine 10.74.  LFTs were unremarkable.  CRP was 1.1.  Calcium was 9.4, ESR 37.  Troponin 6>> 6.  The patient was started on ceftriaxone after blood cultures were obtained.  She was admitted for further evaluation and treatment of her left hand.  ? ?Assessment and Plan: ?* Left hand pain ?Suspicion for cellulitis is low, but continue empiric IV antibiotics for now ?Concerned about early changes for calciphylaxis ?Suspicion for endocarditis remains low ?ESR 37, CRP 1.1 ?Echocardiogram ?Check calcium and phosphorus, intact PTH ?Check rheumatoid factor, anti-CCP ?ANA ?Check complements ?Check SSA, SSB antibodies ?Check  anti-dsDNA ? ?Cellulitis of left hand ?Tender, erythematous rash of palmar side of hand which is warm to touch. ?Left hand x-ray showed soft tissue swelling about the left wrist. ?Patient was started on IV ceftriaxone initially ?There was concern for infective endocarditis due to patient's rash being similar to Less likely [place] lesions (nonpainful), but patient's hand rash was painful,  ?--she has no [place] nodes or roth spots ?Patient does not meet with modified [facility] criteria 1 and 2 at this time ?Blood culture will be obtained with plan to possibly do an echocardiogram based on blood culture findings ? ? ?ESRD (end stage renal disease) on dialysis [facility]) ?Patient usually have HD on MWF, she missed yesterday's session due to not feeling well ?Continue Sensipar ?Nephrology consulted ? ?Obesity, Class III, BMI 40-49.9 (morbid obesity) ([place]) ?BMI 50.58 ?Continue diet and lifestyle modification ? ?Bipolar 1 disorder ([place]) ?Continue Paxil ?PDMP reviewed--pt receives alprazolam 1 mg, #90 monthly ?Pt also receives Norco 10/325, #135 monthly ? ?Controlled type 2 diabetes mellitus without complication, without long-term current use of insulin ([place]) ?Hemoglobin A1c on 09/26/2020 was 5.0 ?3/34/23 A1C--5.4 ? ?OSA on CPAP ?Continue CPAP ? ?GERD ?Continue Tums and Protonix ? ?Essential hypertension ?Patient has no home meds ?Continue lisinopril 10 mg p.o. daily ?Continue IV hydralazine 10 mg every 6 hours as needed for SBP > 170 ? ? ? ? ? ? ? ?Family Communication:   no Family at bedside ? ?Consultants:  renal ? ?Code Status:  FULL  ?DVT Prophylaxis:  [place] Heparin  ? ? ?Procedures: ?As Listed in Progress Note Above ? ?  Antibiotics: ?Ceftriaxone 3/24>> ? ? ? ? ?Subjective: ?Patient states left hand pain is a little better.  Denies f/c, cp, sob, n/v/d, abd pain ? ?Objective: ?Vitals:  ? 10/17/21 2316 10/18/21 0234 10/18/21 0515 10/18/21 0522  ?BP:  131/67  (!) 145/67  ?Pulse: 65 61  62  ?Resp: $Remov'17 18  19  'EmfPen$ ?Temp:  97.7 ?F (36.5 ?C)   97.6 ?F (36.4 ?C)  ?TempSrc:  Oral  Oral  ?SpO2: 98% 98%  97%  ?Weight:   (!) 146.5 kg   ?Height:      ? ? ?Intake/Output Summary (Last 24 hours) at 10/18/2021 0839 ?Last data filed at 10/17/2021 2153 ?Gross per 24 hour  ?Intake 100.48 ml  ?Output --  ?Net 100.48 ml  ? ?Weight change:  ?Exam: ? ?General:  Pt is alert, follows commands appropriately, not in acute distress ?HEENT: No icterus, No thrush, No neck mass, [place]/AT ?Cardiovascular: RRR, S1/S2, no rubs, no gallops ?Respiratory: fine bibasilar rales. No wheeze ?Abdomen: Soft/+BS, non tender, non distended, no guarding ?Extremities: No edema, No lymphangitis, No petechiae, No rashes, no synovitis;  left hand hypothenar eminence induration and ecchymosis ? ? ? ? ? ? ?Data Reviewed: ?I have personally reviewed following labs and imaging studies ?Basic Metabolic Panel: ?Recent Labs  ?Lab 10/17/21 ?1805 10/18/21 ?3748  ?NA 139 141  ?K 4.6 4.3  ?CL 100 103  ?CO2 25 24  ?GLUCOSE 94 108*  ?BUN 41* 44*  ?CREATININE 10.74* 11.33*  ?CALCIUM 9.4 9.2  ?MG  --  2.2  ?PHOS  --  6.1*  ? ?Liver Function Tests: ?Recent Labs  ?Lab 10/17/21 ?1805 10/18/21 ?2707  ?AST 10* 5*  ?ALT 9 8  ?ALKPHOS 98 91  ?BILITOT 0.6 0.4  ?PROT 7.6 6.8  ?ALBUMIN 3.8 3.4*  ? ?No results for input(s): LIPASE, AMYLASE in the last 168 hours. ?No results for input(s): AMMONIA in the last 168 hours. ?Coagulation Profile: ?No results for input(s): INR, PROTIME in the last 168 hours. ?CBC: ?Recent Labs  ?Lab 10/17/21 ?1805 10/18/21 ?8675  ?WBC 4.5 4.1  ?NEUTROABS 2.8  --   ?HGB 9.1* 8.3*  ?HCT 30.4* 28.5*  ?MCV 93.0 93.1  ?PLT 148* 141*  ? ?Cardiac Enzymes: ?No results for input(s): CKTOTAL, CKMB, CKMBINDEX, TROPONINI in the last 168 hours. ?BNP: ?Invalid input(s): POCBNP ?CBG: ?Recent Labs  ?Lab 10/17/21 ?2324  ?GLUCAP 139*  ? ?HbA1C: ?Recent Labs  ?  10/18/21 ?0452  ?HGBA1C 5.4  ? ?Urine analysis: ?   ?Component Value Date/Time  ? COLORURINE YELLOW 03/06/2020 0311  ? APPEARANCEUR HAZY (A) 03/06/2020 0311  ?  LABSPEC 1.019 03/06/2020 0311  ? PHURINE 6.0 03/06/2020 0311  ? GLUCOSEU 150 (A) 03/06/2020 0311  ? HGBUR SMALL (A) 03/06/2020 0311  ? [place] NEGATIVE 03/06/2020 0311  ? [place] NEGATIVE 03/06/2020 0311  ? PROTEINUR >=300 (A) 03/06/2020 0311  ? UROBILINOGEN 0.2 03/17/2015 2236  ? NITRITE NEGATIVE 03/06/2020 0311  ? LEUKOCYTESUR NEGATIVE 03/06/2020 0311  ? ?Sepsis Labs: ?$RemoveBefore'@LABRCNTIP'OjxYORTvYIowt$ (procalcitonin:4,lacticidven:4) ?) ?Recent Results (from the past 240 hour(s))  ?Culture, blood (routine x 2)     Status: None (Preliminary result)  ? Collection Time: 10/17/21  6:05 PM  ? Specimen: BLOOD RIGHT HAND  ?Result Value Ref Range Status  ? Specimen Description BLOOD RIGHT HAND  Final  ? Special Requests   Final  ?  BOTTLES DRAWN AEROBIC ONLY Blood Culture results may not be optimal due to an inadequate volume of blood received in culture bottles  ? Culture  Final  ?  NO GROWTH < 12 HOURS ?Performed at [facility], [address] [place], [place] [address] ?  ? Report Status PENDING  Incomplete  ?Culture, blood (routine x 2)     Status: None (Preliminary result)  ? Collection Time: 10/17/21  6:05 PM  ? Specimen: BLOOD RIGHT WRIST  ?Result Value Ref Range Status  ? Specimen Description BLOOD RIGHT WRIST  Final  ? Special Requests   Final  ?  BOTTLES DRAWN AEROBIC AND ANAEROBIC Blood Culture results may not be optimal due to an inadequate volume of blood received in culture bottles  ? Culture   Final  ?  NO GROWTH < 12 HOURS ?Performed at [facility], [address], [place], [place] [address] ?  ? Report Status PENDING  Incomplete  ?  ? ?Scheduled Meds: ? calcium carbonate  3 tablet Oral TID WC  ? cinacalcet  60 mg Oral Daily  ? dorzolamide-timolol  1 drop Right Eye BID  ? heparin  5,000 Units Subcutaneous Q8H  ? latanoprost  1 drop Right Eye BID  ? lisinopril  10 mg Oral Daily  ? pantoprazole  40 mg Oral Daily  ? PARoxetine  20 mg Oral QPM  ? ?Continuous Infusions: ? cefTRIAXone (ROCEPHIN)  IV     ? ? ?Procedures/Studies: ?DG Chest 2 View ? ?Result Date: 10/17/2021 ?CLINICAL DATA:  Chest pain, dialysis EXAM: CHEST - 2 VIEW COMPARISON:  07/05/2020 FINDINGS: Frontal and lateral views of the chest demonstrate a stable cardiac silhouette. There is m

## 2021-10-18 NOTE — Consult Note (Signed)
[facility] ?Renal Consultation Note ? ?Requesting MD: Orson Eva, MD ?Indication for Consultation:  ESRD ? ?Chief complaint: left hand pain ? ?HPI:  ?Martha [name] is a 56 y.o. female with a history of ESRD on HD Monday Wednesday Friday, bipolar disorder, chronic diastolic CHF, COPD, diabetes mellitus, hypertension and sleep apnea who presented to the hospital with left hand pain for 3 days.  She first noted the pain on dialysis on 10/14/2021.  She missed dialysis on 3/22 given that she was not feeling well.  Her primary team has assessed her and their suspicion for cellulitis or endocarditis is low however they have continued with empiric antibiotics.  They are also doing a serologic work-up.  ? ?PMHx: ?  ?Past Medical History:  ?Diagnosis Date  ? Acid reflux   ? takes Tums  ? Anemia   ? Arthritis   ? Bipolar 1 disorder ([place])   ? Blindness of left eye   ? left eye removed  ? Carpal tunnel syndrome, bilateral   ? Cervical radiculopathy   ? CHF (congestive heart failure) ([place])   ? Chronic kidney disease   ? Stage 5- 01/25/17  ? Constipation   ? COPD (chronic obstructive pulmonary disease) ([place]) 2014  ? bronchitis  ? Degenerative disc disease, thoracic   ? Depression   ? Diabetes mellitus   ? Type II  ? Diabetic retinopathy ([place])   ? Dyspnea   ? when walking  ? End stage kidney disease ([place])   ? M, W, F [place]  ? Gout   ? Hypertension   ? Noncompliance with medication regimen   ? Noncompliance with medication regimen   ? Obesity (BMI 30-39.9)   ? OSA (obstructive sleep apnea)   ? cpap  ? Panic attack   ? RLS (restless legs syndrome)   ? ? ?Past Surgical History:  ?Procedure Laterality Date  ? AORTIC ARCH ANGIOGRAPHY N/A 09/11/2020  ? Procedure: AORTIC ARCH ANGIOGRAPHY;  Surgeon: Serafina Mitchell, MD;  Location: [place] [facility];  Service: Cardiovascular;  Laterality: N/A;  ? AV FISTULA PLACEMENT Right 01/27/2017  ? Procedure: ARTERIOVENOUS (AV) FISTULA CREATION-RIGHT ARM;  Surgeon: Elam Dutch, MD;  Location: [facility] OR;  Service: Vascular;  Laterality: Right;  ? AV FISTULA PLACEMENT Left 08/03/2018  ? Procedure: LEFT ARTERIOVENOUS (AV) FISTULA CREATION;  Surgeon: Elam Dutch, MD;  Location: [place];  Service: Vascular;  Laterality: Left;  ? BASCILIC VEIN TRANSPOSITION Left 04/26/2019  ? Procedure: SECOND STAGE BASILIC VEIN TRANSPOSITION LEFT ARM;  Surgeon: Elam Dutch, MD;  Location: [place];  Service: Vascular;  Laterality: Left;  ? BIOPSY N/A 05/17/2013  ? Procedure: BIOPSY;  Surgeon: [name], MD;  [facility]: [facility];  Service: Endoscopy;  Laterality: N/A;  ? CARPAL TUNNEL RELEASE Left 11/25/2018  ? Procedure: LEFT CARPAL TUNNEL RELEASE;  Surgeon: Charlotte Crumb, MD;  Location: [place];  Service: Orthopedics;  Laterality: Left;  ? CATARACT EXTRACTION Right   ? CHOLECYSTECTOMY N/A 04/27/2014  ? Procedure: LAPAROSCOPIC CHOLECYSTECTOMY;  Surgeon: [name], MD;  Location: [facility];  Service: General;  Laterality: N/A;  ? COLONOSCOPY  06/06/2010  ? VFI:EPPIRJ bleeding secondary to internal hemorrhoids but incomplete evaluation secondary to poor right colon prep/small rectal and sigmoid colon polyps (hyperplastic). PROPOFOL  ? COLONOSCOPY  May 2013  ? Dr. Brita Romp: 5 mm a descending colon polyp, hyperplastic. Adequate bowel prep.  ? ENDOMETRIAL ABLATION    ? ENUCLEATION Left 02/04/2018  ?  ENUCLEATION WITH PLACEMENT OF IMPLANT LEFT EYE  ? ENUCLEATION Left 02/04/2018  ? Procedure: ENUCLEATION WITH PLACEMENT OF IMPLANT LEFT EYE;  Surgeon: Clista Bernhardt, MD;  Location: [place];  Service: Ophthalmology;  Laterality: Left;  ? ESOPHAGOGASTRODUODENOSCOPY  06/06/2010  ? EUM:PNTIRWE erythema and edema of body of stomach, with sessile polypoid lesions. bx benign. no h.pylori  ? ESOPHAGOGASTRODUODENOSCOPY (EGD) WITH PROPOFOL N/A 05/17/2013  ? Dr. Oneida Alar: normal esophagus, moderate nodular gastritis, negative path, empiric [place] dilation  ? EYE SURGERY  07/2017  ? sx for glaucoma  ? FLEXIBLE  SIGMOIDOSCOPY N/A 05/17/2013  ? Dr. Oneida Alar: moderate sized internal hemorrhoids  ? HEMORRHOID BANDING N/A 05/17/2013  ? Procedure: HEMORRHOID BANDING;  Surgeon: [name], MD;  [facility];  Service: Endoscopy;  Laterality: N/A;  2 bands placed  ? INCISION AND DRAINAGE ABSCESS Left 10/11/2013  ? Procedure: INCISION AND DRAINAGE AND DEBRIDEMENT LEFT BREAST  ABSCESS;  Surgeon: [name], MD;  [facility];  Service: General;  Laterality: Left;  ? INSERTION OF DIALYSIS CATHETER N/A 06/08/2018  ? Procedure: INSERTION OF Right internal jugular TUNNELED  DIALYSIS CATHETER;  Surgeon: Angelia Mould, MD;  Location: [place];  Service: Vascular;  Laterality: N/A;  ? [place] Right 06/01/2013  ? Procedure: INCISION AND DRAINAGE AND DEBRIDEMENT ABSCESS RIGHT BREAST;  Surgeon: [name], MD;  [facility];  Service: General;  Laterality: Right;  ? LEFT EYE REMOVED Left 01/2018  ? [employer] on [employer]  ? LIGATION OF ARTERIOVENOUS  FISTULA Right 06/08/2018  ? Procedure: LIGATION OF ARTERIOVENOUS  FISTULA RIGHT ARM;  Surgeon: Angelia Mould, MD;  Location[facility] OR;  Service: Vascular;  Laterality: Right;  ? ORIF ANKLE FRACTURE Right 12/17/2018  ? Procedure: OPEN REDUCTION INTERNAL FIXATION (ORIF) ANKLE FRACTURE;  Surgeon: Edrick Kins, DPM;  Location: [place];  Service: Podiatry;  Laterality: Right;  ? REVISON OF ARTERIOVENOUS FISTULA Left 09/26/2020  ? Procedure: LEFT ARTERIOVENOUS FISTULA  DISTAL REVASCULARIZATION AND LIGATION ( DRIL ) USING GREATER SAPHENOUS VEIN;  Surgeon: Waynetta Sandy, MD;  Location: [place];  Service: Vascular;  Laterality: Left;  ? SAVORY DILATION N/A 05/17/2013  ? Procedure: SAVORY DILATION;  Surgeon: [name], MD;  [facility];  Service: Endoscopy;  Laterality: N/A;  14/15/16  ? SKIN FULL THICKNESS GRAFT Left 05/05/2018  ? Procedure: ABDOMINAL DERMIS FAT SKIN GRAFT FULL THICKNESS LEFT EYE;   Surgeon: Clista Bernhardt, MD;  Location: [place];  Service: Ophthalmology;  Laterality: Left;  ? TUBAL LIGATION    ? VEIN HARVEST Left 09/26/2020  ? Procedure: LEFT LEG GREATER SAPHENOUS VEIN HARVEST;  Surgeon: Waynetta Sandy, MD;  Location: [place];  Service: Vascular;  Laterality: Left;  ? ? ?Family Hx:  ?Family History  ?Adopted: Yes  ?Family history unknown: Yes  ? ? ?Social History:  reports that she quit smoking about 13 years ago. Her smoking use included cigarettes. She has a 37.50 pack-year smoking history. She has never used smokeless tobacco. She reports that she does not drink alcohol and does not use drugs. ? ?Allergies:  ?Allergies  ?Allergen Reactions  ? Codeine Nausea And Vomiting  ? Tape Rash and Other (See Comments)  ?  Pull skin off.  Paper tape is ok  ? ? ?Medications: ?Prior to Admission medications   ?Medication Sig Start Date End Date Taking? Authorizing Provider  ?busPIRone (BUSPAR) 7.5 MG tablet Take 1 tablet (7.5 mg total) by  mouth 2 (two) times daily. 07/12/20  Yes Nita Sells, MD  ?gabapentin (NEURONTIN) 100 MG capsule Take 1 capsule (100 mg total) by mouth at bedtime. ?Patient taking differently: Take 300 mg by mouth at bedtime. 07/12/20  Yes Nita Sells, MD  ?omeprazole (PRILOSEC) 40 MG capsule Take 40 mg by mouth daily.   Yes [provider]  ?PARoxetine (PAXIL) 20 MG tablet Take 1 tablet (20 mg total) by mouth every evening. This is to prevent panic attacks ?Patient taking differently: Take 20 mg by mouth at bedtime. 07/12/20  Yes Nita Sells, MD  ?albuterol (PROVENTIL) (2.5 MG/3ML) 0.083% nebulizer solution Take 2.5 mg by nebulization every 12 (twelve) hours as needed for wheezing or shortness of breath.    [provider]  ?ALPRAZolam Duanne Moron) 1 MG tablet Take 1 tablet (1 mg total) by mouth 4 (four) times daily as needed for anxiety. ?Patient taking differently: Take 1 mg by mouth 4 (four) times daily. 07/12/20   Nita Sells, MD  ?Artificial Tear Ointment (DRY EYES OP) Place 1 drop into the right eye 2 (two) times daily.    [provider]  ?Blood Glucose Monitoring Suppl (FORA V30A BLOOD GLUCOSE SYSTEM) [place]  09/22/20   Pr

## 2021-10-18 NOTE — Procedures (Signed)
? ?  HEMODIALYSIS TREATMENT NOTE: ? ? ?3.5 hour HD session completed using left upper arm AVF (15g/antegrade). Goal met: 2 liters removed with 20 minutes of interrupted UF time for one episode of hypotension. ? ?Loperamide 4 mg given as pt had three stools on HD, the last of which was watery. ? ?All blood was returned and hemostasis was achieved in 15 minutes.  No changes from pre-HD assessment. ? ? ?Rockwell Alexandria, RN ?

## 2021-10-19 ENCOUNTER — Inpatient Hospital Stay (HOSPITAL_COMMUNITY): Payer: Medicaid Other

## 2021-10-19 DIAGNOSIS — R079 Chest pain, unspecified: Secondary | ICD-10-CM

## 2021-10-19 DIAGNOSIS — I5030 Unspecified diastolic (congestive) heart failure: Secondary | ICD-10-CM

## 2021-10-19 DIAGNOSIS — N186 End stage renal disease: Secondary | ICD-10-CM | POA: Diagnosis not present

## 2021-10-19 DIAGNOSIS — L03119 Cellulitis of unspecified part of limb: Secondary | ICD-10-CM | POA: Diagnosis not present

## 2021-10-19 DIAGNOSIS — M79642 Pain in left hand: Secondary | ICD-10-CM | POA: Diagnosis not present

## 2021-10-19 DIAGNOSIS — L02519 Cutaneous abscess of unspecified hand: Secondary | ICD-10-CM

## 2021-10-19 LAB — BASIC METABOLIC PANEL
Anion gap: 12 (ref 5–15)
BUN: 24 mg/dL — ABNORMAL HIGH (ref 6–20)
CO2: 27 mmol/L (ref 22–32)
Calcium: 8.7 mg/dL — ABNORMAL LOW (ref 8.9–10.3)
Chloride: 100 mmol/L (ref 98–111)
Creatinine, Ser: 7.23 mg/dL — ABNORMAL HIGH (ref 0.44–1.00)
GFR, Estimated: 6 mL/min — ABNORMAL LOW (ref >60)
Glucose, Bld: 144 mg/dL — ABNORMAL HIGH (ref 70–99)
Potassium: 4.4 mmol/L (ref 3.5–5.1)
Sodium: 139 mmol/L (ref 135–145)

## 2021-10-19 LAB — SJOGRENS SYNDROME-A EXTRACTABLE NUCLEAR ANTIBODY: SSA (Ro) (ENA) Antibody, IgG: 2.8 AI — ABNORMAL HIGH (ref 0.0–0.9)

## 2021-10-19 LAB — CBC
HCT: 27 % — ABNORMAL LOW (ref 36.0–46.0)
Hemoglobin: 8.4 g/dL — ABNORMAL LOW (ref 12.0–15.0)
MCH: 29.1 pg (ref 26.0–34.0)
MCHC: 31.1 g/dL (ref 30.0–36.0)
MCV: 93.4 fL (ref 80.0–100.0)
Platelets: 126 10*3/uL — ABNORMAL LOW (ref 150–400)
RBC: 2.89 MIL/uL — ABNORMAL LOW (ref 3.87–5.11)
RDW: 15.9 % — ABNORMAL HIGH (ref 11.5–15.5)
WBC: 3.7 10*3/uL — ABNORMAL LOW (ref 4.0–10.5)
nRBC: 0 % (ref 0.0–0.2)

## 2021-10-19 LAB — ENA+DNA/DS+ANTICH+CENTRO+JO...
Anti JO-1: <0.2 AI (ref 0.0–0.9)
Centromere Ab Screen: <0.2 AI (ref 0.0–0.9)
Chromatin Ab SerPl-aCnc: <0.2 AI (ref 0.0–0.9)
ENA SM Ab Ser-aCnc: <0.2 AI (ref 0.0–0.9)
Ribonucleic Protein: <0.2 AI (ref 0.0–0.9)
SSA (Ro) (ENA) Antibody, IgG: 2.6 AI — ABNORMAL HIGH (ref 0.0–0.9)
SSB (La) (ENA) Antibody, IgG: <0.2 AI (ref 0.0–0.9)
Scleroderma (Scl-70) (ENA) Antibody, IgG: <0.2 AI (ref 0.0–0.9)
ds DNA Ab: 1 IU/mL (ref 0–9)

## 2021-10-19 LAB — ECHOCARDIOGRAM COMPLETE
Area-P 1/2: 4.08 cm2
Height: 67 in
S' Lateral: 3 cm
Weight: 5167.58 oz

## 2021-10-19 LAB — PARATHYROID HORMONE, INTACT (NO CA): PTH: 296 pg/mL — ABNORMAL HIGH (ref 15–65)

## 2021-10-19 LAB — RHEUMATOID FACTOR: Rheumatoid fact SerPl-aCnc: <10 IU/mL (ref <14.0)

## 2021-10-19 LAB — C4 COMPLEMENT: Complement C4, Body Fluid: 23 mg/dL (ref 12–38)

## 2021-10-19 LAB — ANTIEXTRACTABLE NUCLEAR AG
ENA SM Ab Ser-aCnc: <0.2 AI (ref 0.0–0.9)
Ribonucleic Protein: <0.2 AI (ref 0.0–0.9)

## 2021-10-19 LAB — C3 COMPLEMENT: C3 Complement: 117 mg/dL (ref 82–167)

## 2021-10-19 LAB — ANA W/REFLEX IF POSITIVE: Anti Nuclear Antibody (ANA): POSITIVE — AB

## 2021-10-19 LAB — SJOGRENS SYNDROME-B EXTRACTABLE NUCLEAR ANTIBODY: SSB (La) (ENA) Antibody, IgG: <0.2 AI (ref 0.0–0.9)

## 2021-10-19 LAB — ANTI-DNA ANTIBODY, DOUBLE-STRANDED: ds DNA Ab: 1 IU/mL (ref 0–9)

## 2021-10-19 MED ORDER — CEFDINIR 300 MG PO CAPS
300.0000 mg | ORAL_CAPSULE | Freq: Every day | ORAL | Status: DC
  Administered 2021-10-19: 300 mg via ORAL
  Filled 2021-10-19: qty 1

## 2021-10-19 MED ORDER — HYDROCODONE-ACETAMINOPHEN 10-325 MG PO TABS: 1.0000 | ORAL_TABLET | Freq: Four times a day (QID) | ORAL | Status: DC | PRN

## 2021-10-19 MED ORDER — CEFDINIR 300 MG PO CAPS: 300.0000 mg | ORAL_CAPSULE | Freq: Every day | ORAL | 0 refills | Status: DC

## 2021-10-19 MED ORDER — PROMETHAZINE HCL 12.5 MG PO TABS
25.0000 mg | ORAL_TABLET | Freq: Four times a day (QID) | ORAL | Status: DC | PRN
  Administered 2021-10-19: 25 mg via ORAL
  Filled 2021-10-19: qty 2

## 2021-10-19 NOTE — Progress Notes (Signed)
Patient's IV found out of place and lying against her wrist by nursing student.  Patient declined new IV placement and wanted to know if she would be discharged first.  MD notified.   Patient instructed that discharge unknown at this time after speaking with MD.  Patient asked whether she wanted IV placed or PO medication.  Patient declined IV again and stated "I just want something that will make me less nauseous."  RN clarified patient would like to try PO medication.  MD notified.  ?

## 2021-10-19 NOTE — Assessment & Plan Note (Signed)
After 48 hours ceftriaxone, pain is improving ?Continue on cefidinir x 5 more days after d/c ?

## 2021-10-19 NOTE — Assessment & Plan Note (Addendum)
PDMP reviewed ?Pt is on chronic Norco 10/325 refilled monthly, #135 ?

## 2021-10-19 NOTE — Discharge Summary (Addendum)
?Physician Discharge Summary ?  ?Patient: Martha [name] MRN: [id number] DOB: 1966/01/26  ?Admit date:     10/17/2021  ?Discharge date: 10/19/21  ?Discharge Physician: Shanon Brow Azia Toutant  ? ?PCP: Emelia Loron, NP  ? ?Recommendations at discharge:  ?Please send referral to dermatology for eval and biopsy of hand lesion ? ?Discharge Diagnoses: ?Principal Problem: ?  Left hand pain ?Active Problems: ?  Cellulitis of left hand ?  ESRD (end stage renal disease) on dialysis [facility]) ?  Essential hypertension ?  GERD ?  OSA on CPAP ?  Controlled type 2 diabetes mellitus without complication, without long-term current use of insulin ([place]) ?  Bipolar 1 disorder ([place]) ?  Obesity, Class III, BMI 40-49.9 (morbid obesity) ([place]) ?  Opioid dependence ([place]) ?  Cellulitis and abscess of hand ? ?Resolved Problems: ?  * No resolved hospital problems. * ? ?Hospital Course: ?56 year old female with a history of ESRD, bipolar disorder, diastolic CHF, and hypertension presenting with left hand pain x 3 days.  The patient states that she first noticed her left hand pain on dialysis on 10/14/2021.  She denies any recent injury or falls.  She has had some subjective chills but denies any fevers.  She denies any new medications.  She states that she has been compliant with dialysis, but she did miss dialysis on 10/16/2021 because she was not feeling well.  She stated that she had some nausea, heartburn sensation, and lightheadedness on that day.  She states that this has since resolved.  She denies any shortness of breath, coughing, hemoptysis, vomiting, diarrhea, abdominal pain.  She went to urgent care on 10/17/2021 for her left hand pain.  There was concern that her hand may have been a manifestation of an infectious process.  As result, the patient was sent to the emergency department for further evaluation and treatment. ?In the ED, the patient has been afebrile and hemodynamically stable with oxygen saturation 97-99% on room air.  WBC 4.5, hemoglobin  9.1, platelets 140,000.  Sodium 139, potassium 4.6, bicarbonate 25, BUN 41, creatinine 10.74.  LFTs were unremarkable.  CRP was 1.1.  Calcium was 9.4, ESR 37.  Troponin 6>> 6.  The patient was started on ceftriaxone after blood cultures were obtained.  She was admitted for further evaluation and treatment of her left hand. ? ?Overall her hand pain improved, but discoloration continued to progress over the left hypothenar eminence from violaceous color to purplish ecchymosis. She will go home with cefdinir x 5 more days to complete 7 day course.  Blood cultures and echo were reassuring.  She will need dermatology follow up for opinion and biopsy of her left hypothenar eminence. ?She developed n/v felt to be due to oxycodone.  This was d/ced and pt given antiemetics with improvement.  She will go home back on her usual dose of hydrocodone. ? ?Assessment and Plan: ?* Left hand pain ?Suspicion for cellulitis is low, but continue empiric IV antibiotics for now ?Concerned about early changes for calciphylaxis>>out pt referral to dermatology for biopsy ?After 48 hours abx, pain is improving ?Suspicion for endocarditis remains very low ?ESR 37, CRP 1.1 ?Echo--EF 60-65%, no WMA, G2DD, no vegetations or source of thrombi ?Check calcium and phosphorus,  ?intact PTH--296 ?Check rheumatoid factor--neg ?-anti-CCP--pending at time d/c ?ANA--pending at time of d/c ?Check complements-normal ?Check SSA --mildly positive ?SSB antibodies--neg ?Check anti-dsDNA--pending at time of dc ?D-dimer 0.62--essentially normal for her age ? ?Cellulitis of left hand ?Tender, erythematous rash of palmar side of  hand which is warm to touch. ?Left hand x-ray showed soft tissue swelling about the left wrist. ?Patient was started on IV ceftriaxone initially ?There was concern for infective endocarditis due to patient's rash being similar to Less likely [place] lesions (nonpainful), but patient's hand rash was painful,  ?--she has no Osler nodes or roth  spots ?Patient does not meet with modified [facility] criteria 1 and 2 at this time ?Blood culture will be obtained with plan to possibly do an echocardiogram based on blood culture findings ? ? ?ESRD (end stage renal disease) on dialysis [facility]) ?Patient usually have HD on MWF, she missed yesterday's session due to not feeling well ?Continue Sensipar ?Nephrology consulted ?Received HD 3/24 ? ?Cellulitis and abscess of hand ?After 48 hours ceftriaxone, pain is improving ?Continue on cefidinir x 5 more days after d/c ? ?Opioid dependence ([place]) ?PDMP reviewed ?Pt is on chronic Norco 10/325 refilled monthly, #135 ? ?Obesity, Class III, BMI 40-49.9 (morbid obesity) ([place]) ?BMI 50.58 ?Continue diet and lifestyle modification ? ?Bipolar 1 disorder ([place]) ?Continue Paxil ?PDMP reviewed--pt receives alprazolam 1 mg, #90 monthly ?Pt also receives Norco 10/325, #135 monthly ? ?Controlled type 2 diabetes mellitus without complication, without long-term current use of insulin ([place]) ?Hemoglobin A1c on 09/26/2020 was 5.0 ?3/34/23 A1C--5.4 ? ?OSA on CPAP ?Continue CPAP ? ?GERD ?Continue Tums and Protonix ? ?Essential hypertension ?Start amlodipine 10 mg daily ?SBP running consistently 150-160s ?Continue IV hydralazine 10 mg every 6 hours as needed for SBP > 170 ? ? ? ? ? ?  ? ? ?Consultants: renal ?Procedures performed: HD 3/24  ?Disposition: Home ?Diet recommendation:  ?Carb modified diet ?DISCHARGE MEDICATION: ?Allergies as of 10/19/2021   ? ?   Reactions  ? Codeine Nausea And Vomiting  ? Tape Rash, Other (See Comments)  ? Pull skin off.  Paper tape is ok  ? ?  ? ?  ?Medication List  ?  ? ?STOP taking these medications   ? ?Lokelma 10 g Pack packet ?Generic drug: sodium zirconium cyclosilicate ?  ?pregabalin 75 MG capsule ?Commonly known as: LYRICA ?  ?SPS 15 GM/60ML suspension ?Generic drug: sodium polystyrene ?  ? ?  ? ?TAKE these medications   ? ?albuterol (2.5 MG/3ML) 0.083% nebulizer solution ?Commonly known as: PROVENTIL ?Take 2.5 mg  by nebulization every 12 (twelve) hours as needed for wheezing or shortness of breath. ?  ?ALPRAZolam 1 MG tablet ?Commonly known as: Duanne Moron ?Take 1 tablet (1 mg total) by mouth 4 (four) times daily as needed for anxiety. ?What changed: when to take this ?  ?Basaglar KwikPen 100 UNIT/ML ?Inject 30 Units into the skin at bedtime. ?  ?busPIRone 7.5 MG tablet ?Commonly known as: BUSPAR ?Take 1 tablet (7.5 mg total) by mouth 2 (two) times daily. ?  ?calcium carbonate 750 MG chewable tablet ?Commonly known as: TUMS EX ?Chew 1-3 tablets by mouth See admin instructions. Take 3 tablets with each meal & take 1 tablets with each snack ?  ?cefdinir 300 MG capsule ?Commonly known as: OMNICEF ?Take 1 capsule (300 mg total) by mouth daily. ?  ?cinacalcet 60 MG tablet ?Commonly known as: SENSIPAR ?Take 60 mg by mouth daily. ?  ?diphenoxylate-atropine 2.5-0.025 MG tablet ?Commonly known as: LOMOTIL ?Take 1 tablet by mouth 4 (four) times daily. ?  ?dorzolamide-timolol 22.3-6.8 MG/ML ophthalmic solution ?Commonly known as: COSOPT ?Place 1 drop into the right eye in the morning and at bedtime. ?  ?doxercalciferol 4 MCG/2ML injection ?Commonly known as: HECTOROL ?Inject 0.5 mLs (  1 mcg total) into the vein every Monday, Wednesday, and Friday with hemodialysis. ?  ?DRY EYES OP ?Place 1 drop into the right eye 2 (two) times daily. ?  ?FORA V30a Blood Glucose System Devi ?  ?ForaCare premium V10 Test test strip ?Generic drug: glucose blood ?  ?gabapentin 100 MG capsule ?Commonly known as: NEURONTIN ?Take 1 capsule (100 mg total) by mouth at bedtime. ?What changed: how much to take ?  ?HYDROcodone-acetaminophen 10-325 MG tablet ?Commonly known as: NORCO ?Take 1 tablet by mouth 4 (four) times daily. ?  ?insulin aspart 100 UNIT/ML injection ?Commonly known as: novoLOG ?Inject 0-9 Units into the skin 3 (three) times daily with meals. ?What changed:  ?how much to take ?additional instructions ?  ?latanoprost 0.005 % ophthalmic solution ?Commonly  known as: XALATAN ?Place 1 drop into the right eye 2 (two) times daily. ?  ?lidocaine-prilocaine cream ?Commonly known as: EMLA ?Apply 1 application topically as needed (topical anesthesia for hemodialy

## 2021-10-19 NOTE — Progress Notes (Signed)
?  Echocardiogram ?2D Echocardiogram has been performed. ? [name] ?10/19/2021, 8:32 AM ?

## 2021-10-19 NOTE — Plan of Care (Signed)
?  Problem: Education: ?Goal: Knowledge of General Education information will improve ?Description: Including pain rating scale, medication(s)/side effects and non-pharmacologic comfort measures ?Outcome: Progressing ?  ?Problem: Health Behavior/Discharge Planning: ?Goal: Ability to manage health-related needs will improve ?Outcome: Progressing ?  ?Problem: Clinical Measurements: ?Goal: Ability to maintain clinical measurements within normal limits will improve ?Outcome: Progressing ?Goal: Will remain free from infection ?Outcome: Progressing ?Goal: Diagnostic test results will improve ?Outcome: Progressing ?Goal: Respiratory complications will improve ?Outcome: Progressing ?Goal: Cardiovascular complication will be avoided ?Outcome: Progressing ?  ?Problem: Coping: ?Goal: Level of anxiety will decrease ?Outcome: Progressing ?  ?Problem: Elimination: ?Goal: Will not experience complications related to bowel motility ?Outcome: Progressing ?Goal: Will not experience complications related to urinary retention ?Outcome: Progressing ?  ?Problem: Safety: ?Goal: Ability to remain free from injury will improve ?Outcome: Progressing ?  ?Problem: Skin Integrity: ?Goal: Risk for impaired skin integrity will decrease ?Outcome: Progressing ?  ?Problem: Activity: ?Goal: Risk for activity intolerance will decrease ?Outcome: Not Progressing ?  ?Problem: Nutrition: ?Goal: Adequate nutrition will be maintained ?Outcome: Not Progressing ?  ?Problem: Pain Managment: ?Goal: General experience of comfort will improve ?Outcome: Not Progressing ?  ?

## 2021-10-20 LAB — COMPLEMENT, TOTAL: Compl, Total (CH50): >60 U/mL (ref >41)

## 2021-10-20 LAB — CYCLIC CITRUL PEPTIDE ANTIBODY, IGG/IGA: CCP Antibodies IgG/IgA: 6 units (ref 0–19)

## 2021-10-20 LAB — HEPATITIS B SURFACE ANTIBODY, QUANTITATIVE: Hep B S AB Quant (Post): 9 m[IU]/mL — ABNORMAL LOW (ref >9.9)

## 2021-10-22 LAB — CULTURE, BLOOD (ROUTINE X 2)
Culture: NO GROWTH
Culture: NO GROWTH

## 2022-02-12 ENCOUNTER — Encounter: Payer: Self-pay | Admitting: Nephrology

## 2022-02-12 ENCOUNTER — Other Ambulatory Visit: Payer: Self-pay | Admitting: Nephrology

## 2022-06-23 ENCOUNTER — Ambulatory Visit
Admission: EM | Admit: 2022-06-23 | Discharge: 2022-06-23 | Disposition: A | Discharge status: Home / Self Care | Payer: Medicaid Other | Attending: Family Medicine | Admitting: Family Medicine

## 2022-06-23 DIAGNOSIS — L03119 Cellulitis of unspecified part of limb: Secondary | ICD-10-CM

## 2022-06-23 DIAGNOSIS — R600 Localized edema: Secondary | ICD-10-CM

## 2022-06-23 MED ORDER — CEPHALEXIN 500 MG PO CAPS: 500.0000 mg | ORAL_CAPSULE | Freq: Two times a day (BID) | ORAL | 0 refills | Status: DC

## 2022-06-23 MED ORDER — TRIAMCINOLONE ACETONIDE 0.1 % EX CREA: 1.0000 | TOPICAL_CREAM | Freq: Two times a day (BID) | CUTANEOUS | 0 refills | Status: DC

## 2022-06-23 NOTE — ED Provider Notes (Signed)
[facility]    CSN: [id number] Arrival date & time: 06/23/22  1329      History   Chief Complaint Chief Complaint  Patient presents with   Rash         HPI KORRI ASK is a 56 y.o. female.   Presenting today with itchy, painful lower legs for the past 1 to 2 weeks.  States she has had significant swelling in both legs additionally.  Denies drainage, bleeding, peeling to the skin and no known wounds or injury, chest pain, shortness of breath, dizziness.  So far not tried anything over-the-counter for her symptoms.  She has a history of end-stage renal disease on dialysis, hypertension, CHF, diabetes, restless leg syndrome, history of cellulitis.   Past Medical History:  Diagnosis Date   Acid reflux    takes Tums   Anemia    Arthritis    Bipolar 1 disorder (HCC)    Blindness of left eye    left eye removed   Carpal tunnel syndrome, bilateral    Cervical radiculopathy    CHF (congestive heart failure) ([place])    Chronic kidney disease    Stage 5- 01/25/17   Constipation    COPD (chronic obstructive pulmonary disease) ([place]) 2014   bronchitis   Degenerative disc disease, thoracic    Depression    Diabetes mellitus    Type II   Diabetic retinopathy (HCC)    Dyspnea    when walking   End stage kidney disease ([place])    M, W, F [facility]   Gout    Hypertension    Noncompliance with medication regimen    Noncompliance with medication regimen    Obesity (BMI 30-39.9)    OSA (obstructive sleep apnea)    cpap   Panic attack    RLS (restless legs syndrome)     Patient Active Problem List   Diagnosis Date Noted   Opioid dependence ([place]) 10/18/2021   Cellulitis and abscess of hand 10/18/2021   Left hand pain 10/17/2021   Cellulitis of left hand 10/17/2021   ESRD (end stage renal disease) ([place]) 09/26/2020   Hypoalbuminemia 07/06/2020   Acute respiratory failure with hypoxia ([place]) 07/06/2020   CAP (community acquired pneumonia) 07/05/2020    Trimalleolar fracture of ankle, closed, right, initial encounter 12/17/2018   Pain and swelling of ankle, right 17/51/0258   Metabolic encephalopathy 52/77/8242   SIRS (systemic inflammatory response syndrome) ([place]) 06/23/2018   UTI (urinary tract infection) 02/26/2018   Volume overload 02/25/2018   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   Dialysis patient ([place]) 12/13/2017   ESRD (end stage renal disease) on dialysis ([place])    Chronic diarrhea 10/08/2017   Anemia in chronic kidney disease 05/24/2017   Chronic obstructive pulmonary disease ([place]) 05/24/2017   Heart failure ([place]) 05/24/2017   Osteopathy in diseases classified elsewhere, unspecified site 05/24/2017   Renal failure (ARF), acute on chronic ([place]) 04/22/2017   Anasarca associated with disorder of kidney 04/20/2017   Protein-calorie malnutrition, severe 11/29/2016   HTN (hypertension), malignant 11/20/2016   Bipolar 1 disorder ([place]) 11/20/2016   Noncompliance with medication regimen 11/20/2016   Panic attack 11/20/2016   Controlled type 2 diabetes mellitus without complication, without long-term current use of insulin ([place]) 10/27/2016   Sensory disturbance 10/27/2016   Left hemiparesis ([place]) 10/27/2016   Cholelithiasis 04/26/2014   Hyponatremia 04/26/2014   Gallstones 04/06/2014   Diabetic ulcer of left great toe ([place]) 10/09/2013   Type 2  diabetes mellitus treated with insulin ([place]) 05/30/2013   Hyperglycemia due to diabetes mellitus ([place]) 05/30/2013   Internal hemorrhoids with other complication 16/55/3748   Umbilical hernia without mention of obstruction or gangrene 04/15/2013   Anemia 04/15/2013   Obesity, Class III, BMI 40-49.9 (morbid obesity) ([place]) 10/08/2012   DM 05/13/2010   Hyperlipidemia 05/13/2010   Morbid obesity ([place]) 05/13/2010   Depression with anxiety 05/13/2010   RESTLESS LEG SYNDROME 05/13/2010   Essential hypertension 05/13/2010   GERD 05/13/2010   RECTAL BLEEDING 05/13/2010   OSA on CPAP  05/13/2010   Dysphagia, oropharyngeal phase 05/13/2010    Past Surgical History:  Procedure Laterality Date   AORTIC ARCH ANGIOGRAPHY N/A 09/11/2020   Procedure: AORTIC ARCH ANGIOGRAPHY;  Surgeon: Serafina Mitchell, MD;  Location: [facility];  Service: Cardiovascular;  Laterality: N/A;   AV FISTULA PLACEMENT Right 01/27/2017   Procedure: ARTERIOVENOUS (AV) FISTULA CREATION-RIGHT ARM;  Surgeon: Elam Dutch, MD;  Location: [facility];  Service: Vascular;  Laterality: Right;   AV FISTULA PLACEMENT Left 08/03/2018   Procedure: LEFT ARTERIOVENOUS (AV) FISTULA CREATION;  Surgeon: Elam Dutch, MD;  Location: [facility];  Service: Vascular;  Laterality: Left;   [place] Left 04/26/2019   Procedure: SECOND STAGE BASILIC VEIN TRANSPOSITION LEFT ARM;  Surgeon: Elam Dutch, MD;  Location: [facility];  Service: Vascular;  Laterality: Left;   BIOPSY N/A 05/17/2013   Procedure: BIOPSY;  Surgeon: [name], MD;  Location: [facility];  Service: Endoscopy;  Laterality: N/A;   CARPAL TUNNEL RELEASE Left 11/25/2018   Procedure: LEFT CARPAL TUNNEL RELEASE;  Surgeon: Charlotte Crumb, MD;  Location: [facility];  Service: Orthopedics;  Laterality: Left;   CATARACT EXTRACTION Right    CHOLECYSTECTOMY N/A 04/27/2014   Procedure: LAPAROSCOPIC CHOLECYSTECTOMY;  Surgeon: [name], MD;  Location: [facility];  Service: General;  Laterality: N/A;   COLONOSCOPY  06/06/2010   OLM:BEMLJQ bleeding secondary to internal hemorrhoids but incomplete evaluation secondary to poor right colon prep/small rectal and sigmoid colon polyps (hyperplastic). PROPOFOL   COLONOSCOPY  May 2013   Dr. Gilliam/[facility]: 5 mm a descending colon polyp, hyperplastic. Adequate bowel prep.   ENDOMETRIAL ABLATION     ENUCLEATION Left 02/04/2018   ENUCLEATION WITH PLACEMENT OF IMPLANT LEFT EYE   ENUCLEATION Left 02/04/2018   Procedure: ENUCLEATION WITH PLACEMENT OF IMPLANT LEFT EYE;  Surgeon: Clista Bernhardt, MD;  Location: [facility];  Service: Ophthalmology;  Laterality: Left;   ESOPHAGOGASTRODUODENOSCOPY  06/06/2010   GBE:EFEOFHQ erythema and edema of body of stomach, with sessile polypoid lesions. bx benign. no h.pylori   ESOPHAGOGASTRODUODENOSCOPY (EGD) WITH PROPOFOL N/A 05/17/2013   Dr. Oneida Alar: normal esophagus, moderate nodular gastritis, negative path, empiric [place] dilation   EYE SURGERY  07/2017   sx for glaucoma   FLEXIBLE SIGMOIDOSCOPY N/A 05/17/2013   Dr. Oneida Alar: moderate sized internal hemorrhoids   HEMORRHOID BANDING N/A 05/17/2013   Procedure: HEMORRHOID BANDING;  Surgeon: [name], MD;  Location: [facility];  Service: Endoscopy;  Laterality: N/A;  2 bands placed   INCISION AND DRAINAGE ABSCESS Left 10/11/2013   Procedure: INCISION AND DRAINAGE AND DEBRIDEMENT LEFT BREAST  ABSCESS;  Surgeon: [name], MD;  Location: [facility];  Service: General;  Laterality: Left;   INSERTION OF DIALYSIS CATHETER N/A 06/08/2018   Procedure: INSERTION OF Right internal jugular TUNNELED  DIALYSIS CATHETER;  Surgeon: Angelia Mould, MD;  Location: [facility];  Service: Vascular;  Laterality: N/A;  IRRIGATION AND DEBRIDEMENT ABSCESS Right 06/01/2013   Procedure: INCISION AND DRAINAGE AND DEBRIDEMENT ABSCESS RIGHT BREAST;  Surgeon: [name], MD;  Location: [facility];  Service: General;  Laterality: Right;   LEFT EYE REMOVED Left 01/2018   [facility] on [address].   LIGATION OF ARTERIOVENOUS  FISTULA Right 06/08/2018   Procedure: LIGATION OF ARTERIOVENOUS  FISTULA RIGHT ARM;  Surgeon: Angelia Mould, MD;  Location: [facility];  Service: Vascular;  Laterality: Right;   ORIF ANKLE FRACTURE Right 12/17/2018   Procedure: OPEN REDUCTION INTERNAL FIXATION (ORIF) ANKLE FRACTURE;  Surgeon: Edrick Kins, DPM;  Location: [facility];  Service: Podiatry;  Laterality: Right;   REVISON OF ARTERIOVENOUS FISTULA Left 09/26/2020   Procedure: LEFT ARTERIOVENOUS FISTULA  DISTAL REVASCULARIZATION AND LIGATION (  DRIL ) USING GREATER SAPHENOUS VEIN;  Surgeon: Waynetta Sandy, MD;  Location: [facility];  Service: Vascular;  Laterality: Left;   SAVORY DILATION N/A 05/17/2013   Procedure: SAVORY DILATION;  Surgeon: [name], MD;  Location: [facility];  Service: Endoscopy;  Laterality: N/A;  14/15/16   SKIN FULL THICKNESS GRAFT Left 05/05/2018   Procedure: ABDOMINAL DERMIS FAT SKIN GRAFT FULL THICKNESS LEFT EYE;  Surgeon: Clista Bernhardt, MD;  Location: [facility];  Service: Ophthalmology;  Laterality: Left;   TUBAL LIGATION     VEIN HARVEST Left 09/26/2020   Procedure: LEFT LEG GREATER SAPHENOUS VEIN HARVEST;  Surgeon: Waynetta Sandy, MD;  Location: [facility];  Service: Vascular;  Laterality: Left;    OB History     Gravida  2   Para  2   Term  1   Preterm  1   AB      Living  2      SAB      IAB      Ectopic      Multiple      Live Births               Home Medications    Prior to Admission medications   Medication Sig Start Date End Date Taking? Authorizing Provider  cephALEXin (KEFLEX) 500 MG capsule Take 1 capsule (500 mg total) by mouth 2 (two) times daily. 06/23/22  Yes Volney American, PA-C  triamcinolone cream (KENALOG) 0.1 % Apply 1 Application topically 2 (two) times daily. 06/23/22  Yes Volney American, PA-C  albuterol (PROVENTIL) (2.5 MG/3ML) 0.083% nebulizer solution Take 2.5 mg by nebulization every 12 (twelve) hours as needed for wheezing or shortness of breath.    [provider]  ALPRAZolam Duanne Moron) 1 MG tablet Take 1 tablet (1 mg total) by mouth 4 (four) times daily as needed for anxiety. Patient taking differently: Take 1 mg by mouth 4 (four) times daily. 07/12/20   Nita Sells, MD  Artificial Tear Ointment (DRY EYES OP) Place 1 drop into the right eye 2 (two) times daily.    [provider]  Blood Glucose Monitoring Suppl (FORA V30A BLOOD GLUCOSE SYSTEM) [place]  09/22/20   [provider]  busPIRone  (BUSPAR) 7.5 MG tablet Take 1 tablet (7.5 mg total) by mouth 2 (two) times daily. 07/12/20   Nita Sells, MD  calcium carbonate (TUMS EX) 750 MG chewable tablet Chew 1-3 tablets by mouth See admin instructions. Take 3 tablets with each meal & take 1 tablets with each snack    [provider]  cefdinir (OMNICEF) 300 MG capsule Take 1 capsule (300 mg total) by mouth daily. 10/19/21  Orson Eva, MD  cinacalcet (SENSIPAR) 60 MG tablet Take 60 mg by mouth daily.    [provider]  diphenoxylate-atropine (LOMOTIL) 2.5-0.025 MG tablet Take 1 tablet by mouth 4 (four) times daily. 10/17/20   [provider]  dorzolamide-timolol (COSOPT) 22.3-6.8 MG/ML ophthalmic solution Place 1 drop into the right eye in the morning and at bedtime. 06/01/20   [provider]  doxercalciferol (HECTOROL) 4 MCG/2ML injection Inject 0.5 mLs (1 mcg total) into the vein every Monday, Wednesday, and Friday with hemodialysis. 12/03/18   Sinda Du, MD  FORACARE PREMIUM V10 TEST test strip  09/22/20   [provider]  gabapentin (NEURONTIN) 100 MG capsule Take 1 capsule (100 mg total) by mouth at bedtime. Patient taking differently: Take 300 mg by mouth at bedtime. 07/12/20   Nita Sells, MD  HYDROcodone-acetaminophen (NORCO) 10-325 MG tablet Take 1 tablet by mouth 4 (four) times daily. 08/30/20   [provider]  insulin aspart (NOVOLOG) 100 UNIT/ML injection Inject 0-9 Units into the skin 3 (three) times daily with meals. Patient taking differently: Inject 4-7 Units into the skin 3 (three) times daily with meals. Sliding scale insulin 12/02/18   Sinda Du, MD  Insulin Glargine [facility]) 100 UNIT/ML Inject 30 Units into the skin at bedtime. 07/12/20   Nita Sells, MD  latanoprost (XALATAN) 0.005 % ophthalmic solution Place 1 drop into the right eye 2 (two) times daily. 08/22/20   [provider]  lidocaine-prilocaine (EMLA) cream  Apply 1 application topically as needed (topical anesthesia for hemodialysis if Gebauers and Lidocaine injection are ineffective.). Patient taking differently: Apply 1 application topically daily as needed (dialysis.). 04/25/17   Johnson, Clanford L, MD  omeprazole (PRILOSEC) 40 MG capsule Take 40 mg by mouth daily.    [provider]  PARoxetine (PAXIL) 20 MG tablet Take 1 tablet (20 mg total) by mouth every evening. This is to prevent panic attacks Patient taking differently: Take 20 mg by mouth at bedtime. 07/12/20   Nita Sells, MD  prednisoLONE acetate (PRED FORTE) 1 % ophthalmic suspension Place 1 drop into the right eye 2 (two) times daily. 08/27/20   [provider]    Family History Family History  Adopted: Yes  Family history unknown: Yes    Social History Social History   Tobacco Use   Smoking status: Former    Packs/day: 1.50    Years: 25.00    Total pack years: 37.50    Types: Cigarettes    Quit date: 10/23/2007    Years since quitting: 14.6   Smokeless tobacco: Never  Vaping Use   Vaping Use: Never used  Substance Use Topics   Alcohol use: No   Drug use: No   Allergies   Codeine and Tape  Review of Systems Review of Systems Per HPI  Physical Exam Triage Vital Signs ED Triage Vitals  Enc Vitals Group     BP 06/23/22 1454 (!) 151/80     Pulse Rate 06/23/22 1454 78     Resp 06/23/22 1454 (!) 24     Temp 06/23/22 1454 99 F (37.2 C)     Temp Source 06/23/22 1454 Oral     SpO2 06/23/22 1454 94 %     Weight --      Height --      Head Circumference --      Peak Flow --      Pain Score 06/23/22 1452 10     Pain Loc --  Pain Edu? --      Excl. in [place]? --    No data found.  Updated Vital Signs BP (!) 151/80 (BP Location: Right Wrist)   Pulse 78   Temp 99 F (37.2 C) (Oral)   Resp (!) 24   LMP 08/03/2008 (Exact Date)   SpO2 94%   Visual Acuity Right Eye Distance:   Left Eye Distance:   Bilateral Distance:     Right Eye Near:   Left Eye Near:    Bilateral Near:     Physical Exam Vitals and nursing note reviewed.  Constitutional:      Appearance: Normal appearance. She is not ill-appearing.  HENT:     Head: Atraumatic.     Mouth/Throat:     Mouth: Mucous membranes are moist.  Eyes:     Extraocular Movements: Extraocular movements intact.  Cardiovascular:     Rate and Rhythm: Normal rate and regular rhythm.     Heart sounds: Normal heart sounds.  Pulmonary:     Effort: Pulmonary effort is normal.     Breath sounds: Normal breath sounds.  Musculoskeletal:     Cervical back: Normal range of motion and neck supple.     Comments: Range of motion at baseline, ambulating with a walker.  Bilateral lower leg edema, slightly worse on the left.  Diffuse erythema, tenderness to palpation.  No obvious wounds or ulcerations, no drainage to the legs  Skin:    General: Skin is warm and dry.  Neurological:     Mental Status: She is alert. Mental status is at baseline.     Comments: Bilateral lower extremities neurovascularly intact  Psychiatric:        Mood and Affect: Mood normal.        Thought Content: Thought content normal.        Judgment: Judgment normal.      UC Treatments / Results  Labs (all labs ordered are listed, but only abnormal results are displayed) Labs Reviewed - No data to display  EKG   Radiology No results found.  Procedures Procedures (including critical care time)  Medications Ordered in UC Medications - No data to display  Initial Impression / Assessment and Plan / UC Course  I have reviewed the triage vital signs and the nursing notes.  Pertinent labs & imaging results that were available during my care of the patient were reviewed by me and considered in my medical decision making (see chart for details).     Will cover for possible cellulitis with Keflex, discussed leg elevation, compression stockings, triamcinolone topically for itching.  Has a  follow-up scheduled for Thursday with PCP as a recheck point.  Follow-up sooner if worsening.  Final Clinical Impressions(s) / UC Diagnoses   Final diagnoses:  Lower leg edema  Cellulitis of lower extremity, unspecified laterality     Discharge Instructions      Elevate legs at rest, apply steroid cream as needed for itching, and complete full course of antibiotics.  Follow-up with primary care as scheduled on Thursday    ED Prescriptions     Medication Sig Dispense Auth. Provider   cephALEXin (KEFLEX) 500 MG capsule Take 1 capsule (500 mg total) by mouth 2 (two) times daily. 14 capsule Volney American, [place]   triamcinolone cream (KENALOG) 0.1 % Apply 1 Application topically 2 (two) times daily. 80 g Volney American, [place]      PDMP not reviewed this encounter.   Volney American, [place]  06/23/22 1532  

## 2022-06-23 NOTE — ED Triage Notes (Signed)
Pt reports swelling, pain, itching rash in legs x 1-2 weeks. Reports feeling tired as she had dialysis today.

## 2022-06-23 NOTE — Discharge Instructions (Addendum)
Elevate legs at rest, apply steroid cream as needed for itching, and complete full course of antibiotics.  Follow-up with primary care as scheduled on Thursday

## 2022-08-05 ENCOUNTER — Ambulatory Visit
Admission: RE | Admit: 2022-08-05 | Discharge: 2022-08-05 | Disposition: A | Discharge status: Home / Self Care | Payer: Medicaid Other | Source: Ambulatory Visit | Attending: Nurse Practitioner | Admitting: Nurse Practitioner

## 2022-08-05 VITALS — BP 152/76 | HR 79 | Temp 99.3°F | Resp 20

## 2022-08-05 DIAGNOSIS — R051 Acute cough: Secondary | ICD-10-CM

## 2022-08-05 DIAGNOSIS — R062 Wheezing: Secondary | ICD-10-CM

## 2022-08-05 MED ORDER — PREDNISONE 20 MG PO TABS: 20.0000 mg | ORAL_TABLET | Freq: Every day | ORAL | 0 refills | Status: AC

## 2022-08-05 MED ORDER — GUAIFENESIN ER 600 MG PO TB12
600.0000 mg | ORAL_TABLET | Freq: Two times a day (BID) | ORAL | 0 refills | Status: DC | PRN
Start: 2022-08-05 — End: 2022-11-05

## 2022-08-05 MED ORDER — ALBUTEROL SULFATE (2.5 MG/3ML) 0.083% IN NEBU: 2.5000 mg | INHALATION_SOLUTION | Freq: Two times a day (BID) | RESPIRATORY_TRACT | 0 refills | Status: DC | PRN

## 2022-08-05 MED ORDER — ALBUTEROL SULFATE (2.5 MG/3ML) 0.083% IN NEBU
2.5000 mg | INHALATION_SOLUTION | Freq: Once | RESPIRATORY_TRACT | Status: AC
  Administered 2022-08-05: 2.5 mg via RESPIRATORY_TRACT

## 2022-08-05 NOTE — ED Triage Notes (Signed)
Pt report she has a cough x  4 days ago.

## 2022-08-05 NOTE — ED Provider Notes (Signed)
[facility]    CSN: [id number] Arrival date & time: 08/05/22  1304      History   Chief Complaint Chief Complaint  Patient presents with   Cough    Entered by patient    HPI Martha [name] is a 57 y.o. female.   Patient presents today for 4-day history of cough and chest congestion.  She reports she feels like "it won't break up."  She also endorses decreased appetite that is now improving and fatigue more than normal slightly.  She denies fever, bodyaches/chills, shortness of breath, current wheezing or chest pain, chest tightness, nasal congestion or runny nose, sore throat, headache, ear pain, abdominal pain, nausea/vomiting, diarrhea, and loss of taste or smell.  No known sick contacts.  Reports she was sent home from dialysis early on Friday because of the cough.  Reports she had dialysis as planned yesterday and "Made them take off an extra liter" since she missed Friday.  Reports initially and over the weekend, she did have some wheezing, however that is now improved.  She has a history of COPD and takes breathing treatments at home every 4 hours.  She also has a history of end-stage renal disease on hemodialysis Monday, Wednesday, Friday.    Past Medical History:  Diagnosis Date   Acid reflux    takes Tums   Anemia    Arthritis    Bipolar 1 disorder (HCC)    Blindness of left eye    left eye removed   Carpal tunnel syndrome, bilateral    Cervical radiculopathy    CHF (congestive heart failure) (HCC)    Chronic kidney disease    Stage 5- 01/25/17   Constipation    COPD (chronic obstructive pulmonary disease) ([place]) 2014   bronchitis   Degenerative disc disease, thoracic    Depression    Diabetes mellitus    Type II   Diabetic retinopathy (HCC)    Dyspnea    when walking   End stage kidney disease ([place])    M, W, F [facility]   Gout    Hypertension    Noncompliance with medication regimen    Noncompliance with medication regimen    Obesity  (BMI 30-39.9)    OSA (obstructive sleep apnea)    cpap   Panic attack    RLS (restless legs syndrome)     Patient Active Problem List   Diagnosis Date Noted   Opioid dependence ([place]) 10/18/2021   Cellulitis and abscess of hand 10/18/2021   Left hand pain 10/17/2021   Cellulitis of left hand 10/17/2021   ESRD (end stage renal disease) ([place]) 09/26/2020   Hypoalbuminemia 07/06/2020   Acute respiratory failure with hypoxia ([place]) 07/06/2020   CAP (community acquired pneumonia) 07/05/2020   Trimalleolar fracture of ankle, closed, right, initial encounter 12/17/2018   Pain and swelling of ankle, right 18/84/1660   Metabolic encephalopathy 63/07/6008   SIRS (systemic inflammatory response syndrome) ([place]) 06/23/2018   UTI (urinary tract infection) 02/26/2018   Volume overload 02/25/2018   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   Dialysis patient ([place]) 12/13/2017   ESRD (end stage renal disease) on dialysis ([place])    Chronic diarrhea 10/08/2017   Anemia in chronic kidney disease 05/24/2017   Chronic obstructive pulmonary disease ([place]) 05/24/2017   Heart failure ([place]) 05/24/2017   Osteopathy in diseases classified elsewhere, unspecified site 05/24/2017   Renal failure (ARF), acute on chronic ([place]) 04/22/2017   Anasarca associated with disorder of  kidney 04/20/2017   Protein-calorie malnutrition, severe 11/29/2016   HTN (hypertension), malignant 11/20/2016   Bipolar 1 disorder ([place]) 11/20/2016   Noncompliance with medication regimen 11/20/2016   Panic attack 11/20/2016   Controlled type 2 diabetes mellitus without complication, without long-term current use of insulin ([place]) 10/27/2016   Sensory disturbance 10/27/2016   Left hemiparesis ([place]) 10/27/2016   Cholelithiasis 04/26/2014   Hyponatremia 04/26/2014   Gallstones 04/06/2014   Diabetic ulcer of left great toe ([place]) 10/09/2013   Type 2 diabetes mellitus treated with insulin ([place]) 05/30/2013   Hyperglycemia due to  diabetes mellitus ([place]) 05/30/2013   Internal hemorrhoids with other complication 19/37/9024   Umbilical hernia without mention of obstruction or gangrene 04/15/2013   Anemia 04/15/2013   Obesity, Class III, BMI 40-49.9 (morbid obesity) ([place]) 10/08/2012   DM 05/13/2010   Hyperlipidemia 05/13/2010   Morbid obesity ([place]) 05/13/2010   Depression with anxiety 05/13/2010   RESTLESS LEG SYNDROME 05/13/2010   Essential hypertension 05/13/2010   GERD 05/13/2010   RECTAL BLEEDING 05/13/2010   OSA on CPAP 05/13/2010   Dysphagia, oropharyngeal phase 05/13/2010    Past Surgical History:  Procedure Laterality Date   AORTIC ARCH ANGIOGRAPHY N/A 09/11/2020   Procedure: AORTIC ARCH ANGIOGRAPHY;  Surgeon: Serafina Mitchell, MD;  Location: [facility];  Service: Cardiovascular;  Laterality: N/A;   AV FISTULA PLACEMENT Right 01/27/2017   Procedure: ARTERIOVENOUS (AV) FISTULA CREATION-RIGHT ARM;  Surgeon: Elam Dutch, MD;  Location: [facility];  Service: Vascular;  Laterality: Right;   AV FISTULA PLACEMENT Left 08/03/2018   Procedure: LEFT ARTERIOVENOUS (AV) FISTULA CREATION;  Surgeon: Elam Dutch, MD;  Location: [facility];  Service: Vascular;  Laterality: Left;   [place] Left 04/26/2019   Procedure: SECOND STAGE BASILIC VEIN TRANSPOSITION LEFT ARM;  Surgeon: Elam Dutch, MD;  Location: [facility];  Service: Vascular;  Laterality: Left;   BIOPSY N/A 05/17/2013   Procedure: BIOPSY;  Surgeon: [name], MD;  Location: [facility];  Service: Endoscopy;  Laterality: N/A;   CARPAL TUNNEL RELEASE Left 11/25/2018   Procedure: LEFT CARPAL TUNNEL RELEASE;  Surgeon: [place] Crumb, MD;  Location: [facility];  Service: Orthopedics;  Laterality: Left;   CATARACT EXTRACTION Right    CHOLECYSTECTOMY N/A 04/27/2014   Procedure: LAPAROSCOPIC CHOLECYSTECTOMY;  Surgeon: [name], MD;  Location: [facility];  Service: General;  Laterality: N/A;   COLONOSCOPY  06/06/2010   OXB:DZHGDJ bleeding  secondary to internal hemorrhoids but incomplete evaluation secondary to poor right colon prep/small rectal and sigmoid colon polyps (hyperplastic). PROPOFOL   COLONOSCOPY  May 2013   Dr. Gilliam/[facility]: 5 mm a descending colon polyp, hyperplastic. Adequate bowel prep.   ENDOMETRIAL ABLATION     ENUCLEATION Left 02/04/2018   ENUCLEATION WITH PLACEMENT OF IMPLANT LEFT EYE   ENUCLEATION Left 02/04/2018   Procedure: ENUCLEATION WITH PLACEMENT OF IMPLANT LEFT EYE;  Surgeon: Clista Bernhardt, MD;  Location: [facility];  Service: Ophthalmology;  Laterality: Left;   ESOPHAGOGASTRODUODENOSCOPY  06/06/2010   MEQ:ASTMHDQ erythema and edema of body of stomach, with sessile polypoid lesions. bx benign. no h.pylori   ESOPHAGOGASTRODUODENOSCOPY (EGD) WITH PROPOFOL N/A 05/17/2013   Dr. Oneida Alar: normal esophagus, moderate nodular gastritis, negative path, empiric Savary dilation   EYE SURGERY  07/2017   sx for glaucoma   FLEXIBLE SIGMOIDOSCOPY N/A 05/17/2013   Dr. Oneida Alar: moderate sized internal hemorrhoids   HEMORRHOID BANDING N/A 05/17/2013   Procedure: Thayer Jew;  Surgeon: [name], MD;  Location: [facility];  Service: Endoscopy;  Laterality: N/A;  2 bands placed   INCISION AND DRAINAGE ABSCESS Left 10/11/2013   Procedure: INCISION AND DRAINAGE AND DEBRIDEMENT LEFT BREAST  ABSCESS;  Surgeon: [name], MD;  Location: [facility];  Service: General;  Laterality: Left;   INSERTION OF DIALYSIS CATHETER N/A 06/08/2018   Procedure: INSERTION OF Right internal jugular TUNNELED  DIALYSIS CATHETER;  Surgeon: Angelia Mould, MD;  Location: [facility];  Service: Vascular;  Laterality: N/A;   [place] Right 06/01/2013   Procedure: INCISION AND DRAINAGE AND DEBRIDEMENT ABSCESS RIGHT BREAST;  Surgeon: [name], MD;  Location: [facility];  Service: General;  Laterality: Right;   LEFT EYE REMOVED Left 01/2018   [facility] [address].   LIGATION OF  ARTERIOVENOUS  FISTULA Right 06/08/2018   Procedure: LIGATION OF ARTERIOVENOUS  FISTULA RIGHT ARM;  Surgeon: Angelia Mould, MD;  Location[facility];  Service: Vascular;  Laterality: Right;   ORIF ANKLE FRACTURE Right 12/17/2018   Procedure: OPEN REDUCTION INTERNAL FIXATION (ORIF) ANKLE FRACTURE;  Surgeon: Edrick Kins, DPM;  Location: [facility];  Service: Podiatry;  Laterality: Right;   REVISON OF ARTERIOVENOUS FISTULA Left 09/26/2020   Procedure: LEFT ARTERIOVENOUS FISTULA  DISTAL REVASCULARIZATION AND LIGATION ( DRIL ) USING GREATER SAPHENOUS VEIN;  Surgeon: Waynetta Sandy, MD;  Location: [facility];  Service: Vascular;  Laterality: Left;   SAVORY DILATION N/A 05/17/2013   Procedure: SAVORY DILATION;  Surgeon: [name], MD;  Location[facility];  Service: Endoscopy;  Laterality: N/A;  14/15/16   SKIN FULL THICKNESS GRAFT Left 05/05/2018   Procedure: ABDOMINAL DERMIS FAT SKIN GRAFT FULL THICKNESS LEFT EYE;  Surgeon: Clista Bernhardt, MD;  Location: [facility];  Service: Ophthalmology;  Laterality: Left;   TUBAL LIGATION     VEIN HARVEST Left 09/26/2020   Procedure: LEFT LEG GREATER SAPHENOUS VEIN HARVEST;  Surgeon: Waynetta Sandy, MD;  Location: [facility];  Service: Vascular;  Laterality: Left;    OB History     Gravida  2   Para  2   Term  1   Preterm  1   AB      Living  2      SAB      IAB      Ectopic      Multiple      Live Births               Home Medications    Prior to Admission medications   Medication Sig Start Date End Date Taking? Authorizing Provider  guaiFENesin (MUCINEX) 600 MG 12 hr tablet Take 1 tablet (600 mg total) by mouth 2 (two) times daily as needed for cough or to loosen phlegm. 08/05/22  Yes Eulogio Bear, NP  predniSONE (DELTASONE) 20 MG tablet Take 1 tablet (20 mg total) by mouth daily with breakfast for 5 days. 08/05/22 08/10/22 Yes Eulogio Bear, NP  albuterol (PROVENTIL) (2.5 MG/3ML) 0.083% nebulizer solution Take  3 mLs (2.5 mg total) by nebulization every 12 (twelve) hours as needed for wheezing or shortness of breath. 08/05/22   Eulogio Bear, NP  ALPRAZolam Duanne Moron) 1 MG tablet Take 1 tablet (1 mg total) by mouth 4 (four) times daily as needed for anxiety. Patient taking differently: Take 1 mg by mouth 4 (four) times daily. 07/12/20   Nita Sells, MD  Artificial Tear Ointment (DRY EYES OP) Place 1 drop into the  right eye 2 (two) times daily.    [provider]  Blood Glucose Monitoring Suppl (FORA V30A BLOOD GLUCOSE SYSTEM) [place]  09/22/20   [provider]  busPIRone (BUSPAR) 7.5 MG tablet Take 1 tablet (7.5 mg total) by mouth 2 (two) times daily. 07/12/20   Nita Sells, MD  calcium carbonate (TUMS EX) 750 MG chewable tablet Chew 1-3 tablets by mouth See admin instructions. Take 3 tablets with each meal & take 1 tablets with each snack    [provider]  cinacalcet (SENSIPAR) 60 MG tablet Take 60 mg by mouth daily.    [provider]  diphenoxylate-atropine (LOMOTIL) 2.5-0.025 MG tablet Take 1 tablet by mouth 4 (four) times daily. 10/17/20   [provider]  dorzolamide-timolol (COSOPT) 22.3-6.8 MG/ML ophthalmic solution Place 1 drop into the right eye in the morning and at bedtime. 06/01/20   [provider]  doxercalciferol (HECTOROL) 4 MCG/2ML injection Inject 0.5 mLs (1 mcg total) into the vein every Monday, Wednesday, and Friday with hemodialysis. 12/03/18   Sinda Du, MD  FORACARE PREMIUM V10 TEST test strip  09/22/20   [provider]  gabapentin (NEURONTIN) 100 MG capsule Take 1 capsule (100 mg total) by mouth at bedtime. Patient taking differently: Take 300 mg by mouth at bedtime. 07/12/20   Nita Sells, MD  HYDROcodone-acetaminophen (NORCO) 10-325 MG tablet Take 1 tablet by mouth 4 (four) times daily. 08/30/20   [provider]  insulin aspart (NOVOLOG) 100 UNIT/ML injection Inject 0-9 Units into  the skin 3 (three) times daily with meals. Patient taking differently: Inject 4-7 Units into the skin 3 (three) times daily with meals. Sliding scale insulin 12/02/18   Sinda Du, MD  Insulin Glargine [facility]) 100 UNIT/ML Inject 30 Units into the skin at bedtime. 07/12/20   Nita Sells, MD  latanoprost (XALATAN) 0.005 % ophthalmic solution Place 1 drop into the right eye 2 (two) times daily. 08/22/20   [provider]  lidocaine-prilocaine (EMLA) cream Apply 1 application topically as needed (topical anesthesia for hemodialysis if Gebauers and Lidocaine injection are ineffective.). Patient taking differently: Apply 1 application  topically daily as needed (dialysis.). 04/25/17   Johnson, Clanford L, MD  omeprazole (PRILOSEC) 40 MG capsule Take 40 mg by mouth daily.    [provider]  PARoxetine (PAXIL) 20 MG tablet Take 1 tablet (20 mg total) by mouth every evening. This is to prevent panic attacks Patient taking differently: Take 20 mg by mouth at bedtime. 07/12/20   Nita Sells, MD  prednisoLONE acetate (PRED FORTE) 1 % ophthalmic suspension Place 1 drop into the right eye 2 (two) times daily. 08/27/20   [provider]  triamcinolone cream (KENALOG) 0.1 % Apply 1 Application topically 2 (two) times daily. 06/23/22   Volney American, PA-C    Family History Family History  Adopted: Yes  Family history unknown: Yes    Social History Social History   Tobacco Use   Smoking status: Former    Packs/day: 1.50    Years: 25.00    Total pack years: 37.50    Types: Cigarettes    Quit date: 10/23/2007    Years since quitting: 14.7   Smokeless tobacco: Never  Vaping Use   Vaping Use: Never used  Substance Use Topics   Alcohol use: No   Drug use: No     Allergies   Codeine and Tape   Review of Systems Review of Systems Per HPI  Physical Exam Triage  Vital Signs ED Triage Vitals  Enc Vitals Group     BP 08/05/22 1325  (!) 152/76     Pulse Rate 08/05/22 1325 79     Resp 08/05/22 1325 20     Temp 08/05/22 1325 99.3 F (37.4 C)     Temp Source 08/05/22 1325 Oral     SpO2 08/05/22 1325 91 %     Weight --      Height --      Head Circumference --      Peak Flow --      Pain Score 08/05/22 1327 0     Pain Loc --      Pain Edu? --      Excl. in [place]? --    No data found.  Updated Vital Signs BP (!) 152/76 (BP Location: Right Arm)   Pulse 79   Temp 99.3 F (37.4 C) (Oral)   Resp 20   LMP 08/03/2008 (Exact Date)   SpO2 91%    O2 saturation post SABA: 99% on RA  Visual Acuity Right Eye Distance:   Left Eye Distance:   Bilateral Distance:    Right Eye Near:   Left Eye Near:    Bilateral Near:     Physical Exam Vitals and nursing note reviewed.  Constitutional:      General: She is not in acute distress.    Appearance: Normal appearance. She is not ill-appearing or toxic-appearing.  HENT:     Head: Normocephalic and atraumatic.     Right Ear: Tympanic membrane, ear canal and external ear normal.     Left Ear: Tympanic membrane, ear canal and external ear normal.     Nose: Rhinorrhea present. No congestion.     Mouth/Throat:     Mouth: Mucous membranes are moist.     Pharynx: Oropharynx is clear. Posterior oropharyngeal erythema present. No oropharyngeal exudate.  Eyes:     Comments: Patient legally blind with traumatic injury to left eye  Cardiovascular:     Rate and Rhythm: Normal rate and regular rhythm.  Pulmonary:     Effort: Pulmonary effort is normal. No respiratory distress.     Breath sounds: Normal breath sounds. Decreased air movement present. No wheezing, rhonchi or rales.  Abdominal:     General: Abdomen is flat. Bowel sounds are normal. There is no distension.     Palpations: Abdomen is soft.     Tenderness: There is no abdominal tenderness.  Musculoskeletal:     Cervical back: Normal range of motion and neck supple.  Lymphadenopathy:     Cervical: No cervical  adenopathy.  Skin:    General: Skin is warm and dry.     Coloration: Skin is not jaundiced or pale.     Findings: No erythema or rash.  Neurological:     Mental Status: She is alert and oriented to person, place, and time.  Psychiatric:        Behavior: Behavior is cooperative.      UC Treatments / Results  Labs (all labs ordered are listed, but only abnormal results are displayed) Labs Reviewed - No data to display  EKG   Radiology No results found.  Procedures Procedures (including critical care time)  Medications Ordered in UC Medications  albuterol (PROVENTIL) (2.5 MG/3ML) 0.083% nebulizer solution 2.5 mg (2.5 mg Nebulization Given 08/05/22 1359)    Initial Impression / Assessment and Plan / UC Course  I have reviewed the triage vital signs and the nursing  notes.  Pertinent labs & imaging results that were available during my care of the patient were reviewed by me and considered in my medical decision making (see chart for details).   Patient is well-appearing, normotensive, afebrile, not tachycardic, not tachypneic, oxygenating well on room air.  Initially in triage, she is saturating 91% on room air; post albuterol breathing treatment, oxygen saturation increased to 98% on room air.  Acute cough Wheezing Suspect secondary to viral illness which sounds like he has improved Albuterol breathing treatment given in urgent care today with full relief of decreased lung sounds, oxygenation also increased No work of breathing increased, signs or symptoms of pneumonia today Recommended use of albuterol nebulizer every 4-6 hours at home as needed, Mucinex 600 milligrams twice daily Start low-dose prednisone burst tomorrow given history of diabetes and also to help with lung inflammation/wheezing  The patient was given the opportunity to ask questions.  All questions answered to their satisfaction.  The patient is in agreement to this plan.    Final Clinical Impressions(s) /  UC Diagnoses   Final diagnoses:  Acute cough  Wheezing     Discharge Instructions      Please start guaifenesin 600 mg twice daily to help break up congestion.  Continue breathing treatments at home every 4-6 hours as needed for wheezing or shortness of breath.  Start the oral prednisone to help with inflammation in your lungs.  Follow up with PCP with no improvement of symptoms.  With worsening of symptoms, go to ER or call 911.     ED Prescriptions     Medication Sig Dispense Auth. Provider   guaiFENesin (MUCINEX) 600 MG 12 hr tablet Take 1 tablet (600 mg total) by mouth 2 (two) times daily as needed for cough or to loosen phlegm. 30 tablet Noemi Chapel A, NP   predniSONE (DELTASONE) 20 MG tablet Take 1 tablet (20 mg total) by mouth daily with breakfast for 5 days. 5 tablet Noemi Chapel A, NP   albuterol (PROVENTIL) (2.5 MG/3ML) 0.083% nebulizer solution Take 3 mLs (2.5 mg total) by nebulization every 12 (twelve) hours as needed for wheezing or shortness of breath. 75 mL Eulogio Bear, NP      PDMP not reviewed this encounter.   Eulogio Bear, NP 08/05/22 [phone]

## 2022-08-05 NOTE — Discharge Instructions (Addendum)
Please start guaifenesin 600 mg twice daily to help break up congestion.  Continue breathing treatments at home every 4-6 hours as needed for wheezing or shortness of breath.  Start the oral prednisone to help with inflammation in your lungs.  Follow up with PCP with no improvement of symptoms.  With worsening of symptoms, go to ER or call 911.

## 2022-11-03 ENCOUNTER — Encounter (HOSPITAL_COMMUNITY): Payer: Self-pay

## 2022-11-03 ENCOUNTER — Emergency Department (HOSPITAL_COMMUNITY): Payer: Medicaid Other

## 2022-11-03 ENCOUNTER — Ambulatory Visit: Payer: Medicaid Other

## 2022-11-03 ENCOUNTER — Ambulatory Visit
Admission: RE | Admit: 2022-11-03 | Discharge: 2022-11-03 | Disposition: A | Discharge status: Other Acute Inpatient Hospital | Payer: Medicaid Other | Source: Ambulatory Visit | Attending: Physician Assistant | Admitting: Physician Assistant

## 2022-11-03 ENCOUNTER — Other Ambulatory Visit: Payer: Self-pay

## 2022-11-03 ENCOUNTER — Inpatient Hospital Stay (HOSPITAL_COMMUNITY)
Admission: EM | Admit: 2022-11-03 | Discharge: 2022-11-05 | DRG: 193 | Disposition: A | Discharge status: Home / Self Care | Payer: Medicaid Other | Source: Ambulatory Visit | Attending: Family Medicine | Admitting: Family Medicine

## 2022-11-03 VITALS — BP 106/52 | HR 79 | Temp 98.3°F | Resp 25

## 2022-11-03 DIAGNOSIS — N186 End stage renal disease: Secondary | ICD-10-CM | POA: Diagnosis present

## 2022-11-03 DIAGNOSIS — E11319 Type 2 diabetes mellitus with unspecified diabetic retinopathy without macular edema: Secondary | ICD-10-CM | POA: Diagnosis present

## 2022-11-03 DIAGNOSIS — K7460 Unspecified cirrhosis of liver: Secondary | ICD-10-CM | POA: Diagnosis present

## 2022-11-03 DIAGNOSIS — I5033 Acute on chronic diastolic (congestive) heart failure: Secondary | ICD-10-CM | POA: Diagnosis not present

## 2022-11-03 DIAGNOSIS — Z87891 Personal history of nicotine dependence: Secondary | ICD-10-CM

## 2022-11-03 DIAGNOSIS — E876 Hypokalemia: Secondary | ICD-10-CM | POA: Diagnosis present

## 2022-11-03 DIAGNOSIS — J440 Chronic obstructive pulmonary disease with acute lower respiratory infection: Secondary | ICD-10-CM | POA: Diagnosis present

## 2022-11-03 DIAGNOSIS — J9601 Acute respiratory failure with hypoxia: Secondary | ICD-10-CM

## 2022-11-03 DIAGNOSIS — H548 Legal blindness, as defined in USA: Secondary | ICD-10-CM | POA: Diagnosis present

## 2022-11-03 DIAGNOSIS — E785 Hyperlipidemia, unspecified: Secondary | ICD-10-CM | POA: Diagnosis present

## 2022-11-03 DIAGNOSIS — I5032 Chronic diastolic (congestive) heart failure: Secondary | ICD-10-CM | POA: Diagnosis present

## 2022-11-03 DIAGNOSIS — D631 Anemia in chronic kidney disease: Secondary | ICD-10-CM | POA: Diagnosis present

## 2022-11-03 DIAGNOSIS — M1990 Unspecified osteoarthritis, unspecified site: Secondary | ICD-10-CM | POA: Diagnosis present

## 2022-11-03 DIAGNOSIS — Z79899 Other long term (current) drug therapy: Secondary | ICD-10-CM

## 2022-11-03 DIAGNOSIS — Z888 Allergy status to other drugs, medicaments and biological substances status: Secondary | ICD-10-CM

## 2022-11-03 DIAGNOSIS — A084 Viral intestinal infection, unspecified: Secondary | ICD-10-CM | POA: Diagnosis present

## 2022-11-03 DIAGNOSIS — G2581 Restless legs syndrome: Secondary | ICD-10-CM | POA: Diagnosis present

## 2022-11-03 DIAGNOSIS — D696 Thrombocytopenia, unspecified: Secondary | ICD-10-CM | POA: Diagnosis present

## 2022-11-03 DIAGNOSIS — J441 Chronic obstructive pulmonary disease with (acute) exacerbation: Secondary | ICD-10-CM

## 2022-11-03 DIAGNOSIS — R0902 Hypoxemia: Principal | ICD-10-CM

## 2022-11-03 DIAGNOSIS — J129 Viral pneumonia, unspecified: Principal | ICD-10-CM | POA: Diagnosis present

## 2022-11-03 DIAGNOSIS — Z885 Allergy status to narcotic agent status: Secondary | ICD-10-CM

## 2022-11-03 DIAGNOSIS — Z1152 Encounter for screening for COVID-19: Secondary | ICD-10-CM

## 2022-11-03 DIAGNOSIS — Z794 Long term (current) use of insulin: Secondary | ICD-10-CM

## 2022-11-03 DIAGNOSIS — I2489 Other forms of acute ischemic heart disease: Secondary | ICD-10-CM | POA: Diagnosis present

## 2022-11-03 DIAGNOSIS — K7581 Nonalcoholic steatohepatitis (NASH): Secondary | ICD-10-CM | POA: Diagnosis present

## 2022-11-03 DIAGNOSIS — G4733 Obstructive sleep apnea (adult) (pediatric): Secondary | ICD-10-CM | POA: Diagnosis present

## 2022-11-03 DIAGNOSIS — F319 Bipolar disorder, unspecified: Secondary | ICD-10-CM | POA: Diagnosis present

## 2022-11-03 DIAGNOSIS — M898X9 Other specified disorders of bone, unspecified site: Secondary | ICD-10-CM | POA: Diagnosis present

## 2022-11-03 DIAGNOSIS — E1122 Type 2 diabetes mellitus with diabetic chronic kidney disease: Secondary | ICD-10-CM | POA: Diagnosis present

## 2022-11-03 DIAGNOSIS — Z992 Dependence on renal dialysis: Secondary | ICD-10-CM

## 2022-11-03 DIAGNOSIS — Z6841 Body Mass Index (BMI) 40.0 and over, adult: Secondary | ICD-10-CM

## 2022-11-03 HISTORY — DX: Unspecified cirrhosis of liver: K74.60

## 2022-11-03 LAB — SARS CORONAVIRUS 2 BY RT PCR: SARS Coronavirus 2 by RT PCR: NEGATIVE

## 2022-11-03 MED ORDER — IPRATROPIUM-ALBUTEROL 0.5-2.5 (3) MG/3ML IN SOLN
3.0000 mL | Freq: Once | RESPIRATORY_TRACT | Status: AC
  Administered 2022-11-03: 3 mL via RESPIRATORY_TRACT

## 2022-11-03 NOTE — ED Notes (Addendum)
2L of O2 administered via nasal canula per provides orders

## 2022-11-03 NOTE — ED Provider Notes (Signed)
[facility]    CSN: [id number] Arrival date & time: 11/03/22  1835      History   Chief Complaint Chief Complaint  Patient presents with   Nasal Congestion    Bad cough, head congestion - Entered by patient    HPI Martha [name] is a 57 y.o. female.   Patient presents today with a 1 week history of URI symptoms including nasal congestion, cough, shortness of breath.  She does have a history of COPD that has previously required oxygen and does not currently have oxygen at home.  She reports that she lost a piece to her nebulizer and has not been taking any breathing treatments but has been using her inhaler.  She has a history of diabetes and reports her blood sugar has been slightly elevated with fasting 180 today.  She is on dialysis and gets dialysis Tuesday Thursday Saturday.  She is having difficulty with daily toady as result of symptoms.  Denies any recent antibiotics or steroids.  She denies any known sick contacts.    Past Medical History:  Diagnosis Date   Acid reflux    takes Tums   Anemia    Arthritis    Bipolar 1 disorder    Blindness of left eye    left eye removed   Carpal tunnel syndrome, bilateral    Cervical radiculopathy    CHF (congestive heart failure)    Chronic kidney disease    Stage 5- 01/25/17   Constipation    COPD (chronic obstructive pulmonary disease) 2014   bronchitis   Degenerative disc disease, thoracic    Depression    Diabetes mellitus    Type II   Diabetic retinopathy    Dyspnea    when walking   End stage kidney disease    M, W, [facility]   Gout    Hypertension    Noncompliance with medication regimen    Noncompliance with medication regimen    Obesity (BMI 30-39.9)    OSA (obstructive sleep apnea)    cpap   Panic attack    RLS (restless legs syndrome)     Patient Active Problem List   Diagnosis Date Noted   Opioid dependence 10/18/2021   Cellulitis and abscess of hand 10/18/2021   Left hand pain  10/17/2021   Cellulitis of left hand 10/17/2021   ESRD (end stage renal disease) 09/26/2020   Hypoalbuminemia 07/06/2020   Acute respiratory failure with hypoxia 07/06/2020   CAP (community acquired pneumonia) 07/05/2020   Trimalleolar fracture of ankle, closed, right, initial encounter 12/17/2018   Pain and swelling of ankle, right 12/16/2018   Metabolic encephalopathy 09/26/2018   SIRS (systemic inflammatory response syndrome) 06/23/2018   UTI (urinary tract infection) 02/26/2018   Volume overload 02/25/2018   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   Dialysis patient 12/13/2017   ESRD (end stage renal disease) on dialysis    Chronic diarrhea 10/08/2017   Anemia in chronic kidney disease 05/24/2017   Chronic obstructive pulmonary disease 05/24/2017   Heart failure 05/24/2017   Osteopathy in diseases classified elsewhere, unspecified site 05/24/2017   Renal failure (ARF), acute on chronic 04/22/2017   Anasarca associated with disorder of kidney 04/20/2017   Protein-calorie malnutrition, severe 11/29/2016   HTN (hypertension), malignant 11/20/2016   Bipolar 1 disorder 11/20/2016   Noncompliance with medication regimen 11/20/2016   Panic attack 11/20/2016   Controlled type 2 diabetes mellitus without complication, without long-term current use of insulin  10/27/2016   Sensory disturbance 10/27/2016   Left hemiparesis 10/27/2016   Cholelithiasis 04/26/2014   Hyponatremia 04/26/2014   Gallstones 04/06/2014   Diabetic ulcer of left great toe 10/09/2013   Type 2 diabetes mellitus treated with insulin 05/30/2013   Hyperglycemia due to diabetes mellitus 05/30/2013   Internal hemorrhoids with other complication 04/28/2013   Umbilical hernia without mention of obstruction or gangrene 04/15/2013   Anemia 04/15/2013   Obesity, Class III, BMI 40-49.9 (morbid obesity) 10/08/2012   DM 05/13/2010   Hyperlipidemia 05/13/2010   Morbid obesity 05/13/2010   Depression with anxiety  05/13/2010   RESTLESS LEG SYNDROME 05/13/2010   Essential hypertension 05/13/2010   GERD 05/13/2010   RECTAL BLEEDING 05/13/2010   OSA on CPAP 05/13/2010   Dysphagia, oropharyngeal phase 05/13/2010    Past Surgical History:  Procedure Laterality Date   AORTIC ARCH ANGIOGRAPHY N/A 09/11/2020   Procedure: AORTIC ARCH ANGIOGRAPHY;  Surgeon: Nada Libman, MD;  [facility]: [facility];  Service: Cardiovascular;  Laterality: N/A;   AV FISTULA PLACEMENT Right 01/27/2017   Procedure: ARTERIOVENOUS (AV) FISTULA CREATION-RIGHT ARM;  Surgeon: Sherren Kerns, MD;  Location: [facility] OR;  Service: Vascular;  Laterality: Right;   AV FISTULA PLACEMENT Left 08/03/2018   Procedure: LEFT ARTERIOVENOUS (AV) FISTULA CREATION;  Surgeon: Sherren Kerns, MD;  Location: [facility] OR;  Service: Vascular;  Laterality: Left;   BASCILIC VEIN TRANSPOSITION Left 04/26/2019   Procedure: SECOND STAGE BASILIC VEIN TRANSPOSITION LEFT ARM;  Surgeon: Sherren Kerns, MD;  Location: [facility] OR;  Service: Vascular;  Laterality: Left;   BIOPSY N/A 05/17/2013   Procedure: BIOPSY;  Surgeon: West Bali, MD;  Location: [facility]S;  Service: Endoscopy;  Laterality: N/A;   CARPAL TUNNEL RELEASE Left 11/25/2018   Procedure: LEFT CARPAL TUNNEL RELEASE;  Surgeon: Dairl Ponder, MD;  Location: [facility] OR;  Service: Orthopedics;  Laterality: Left;   CATARACT EXTRACTION Right    CHOLECYSTECTOMY N/A 04/27/2014   Procedure: LAPAROSCOPIC CHOLECYSTECTOMY;  Surgeon: Marlane Hatcher, MD;  Location: [facility];  Service: General;  Laterality: N/A;   COLONOSCOPY  06/06/2010   HWT:UUEKCM bleeding secondary to internal hemorrhoids but incomplete evaluation secondary to poor right colon prep/small rectal and sigmoid colon polyps (hyperplastic). PROPOFOL   COLONOSCOPY  May 2013   Dr. Gilliam/[facility]: 5 mm a descending colon polyp, hyperplastic. Adequate bowel prep.   ENDOMETRIAL ABLATION     ENUCLEATION Left 02/04/2018   ENUCLEATION WITH PLACEMENT OF IMPLANT  LEFT EYE   ENUCLEATION Left 02/04/2018   Procedure: ENUCLEATION WITH PLACEMENT OF IMPLANT LEFT EYE;  Surgeon: Floydene Flock, MD;  Location: [facility] OR;  Service: Ophthalmology;  Laterality: Left;   ESOPHAGOGASTRODUODENOSCOPY  06/06/2010   KLK:JZPHXTA erythema and edema of body of stomach, with sessile polypoid lesions. bx benign. no h.pylori   ESOPHAGOGASTRODUODENOSCOPY (EGD) WITH PROPOFOL N/A 05/17/2013   Dr. Darrick Penna: normal esophagus, moderate nodular gastritis, negative path, empiric Savary dilation   EYE SURGERY  07/2017   sx for glaucoma   FLEXIBLE SIGMOIDOSCOPY N/A 05/17/2013   Dr. Darrick Penna: moderate sized internal hemorrhoids   HEMORRHOID BANDING N/A 05/17/2013   Procedure: HEMORRHOID BANDING;  Surgeon: West Bali, MD;  Location: [facility];  Service: Endoscopy;  Laterality: N/A;  2 bands placed   INCISION AND DRAINAGE ABSCESS Left 10/11/2013   Procedure: INCISION AND DRAINAGE AND DEBRIDEMENT LEFT BREAST  ABSCESS;  Surgeon: Marlane Hatcher, MD;  Location: [facility];  Service: General;  Laterality: Left;   INSERTION OF DIALYSIS  CATHETER N/A 06/08/2018   Procedure: INSERTION OF Right internal jugular TUNNELED  DIALYSIS CATHETER;  Surgeon: Chuck Hint, MD;  Location: [facility] OR;  Service: Vascular;  Laterality: N/A;   IRRIGATION AND DEBRIDEMENT ABSCESS Right 06/01/2013   Procedure: INCISION AND DRAINAGE AND DEBRIDEMENT ABSCESS RIGHT BREAST;  Surgeon: Marlane Hatcher, MD;  Location: [facility];  Service: General;  Laterality: Right;   LEFT EYE REMOVED Left 01/2018   [facility] [address].   LIGATION OF ARTERIOVENOUS  FISTULA Right 06/08/2018   Procedure: LIGATION OF ARTERIOVENOUS  FISTULA RIGHT ARM;  Surgeon: Chuck Hint, MD;  Location: [facility] OR;  Service: Vascular;  Laterality: Right;   ORIF ANKLE FRACTURE Right 12/17/2018   Procedure: OPEN REDUCTION INTERNAL FIXATION (ORIF) ANKLE FRACTURE;  Surgeon: Felecia Shelling, DPM;  Location: [facility] OR;  Service: Podiatry;   Laterality: Right;   REVISON OF ARTERIOVENOUS FISTULA Left 09/26/2020   Procedure: LEFT ARTERIOVENOUS FISTULA  DISTAL REVASCULARIZATION AND LIGATION ( DRIL ) USING GREATER SAPHENOUS VEIN;  Surgeon: Maeola Harman, MD;  Location: [facility] OR;  Service: Vascular;  Laterality: Left;   SAVORY DILATION N/A 05/17/2013   Procedure: SAVORY DILATION;  Surgeon: West Bali, MD;  Location: [facility];  Service: Endoscopy;  Laterality: N/A;  14/15/16   SKIN FULL THICKNESS GRAFT Left 05/05/2018   Procedure: ABDOMINAL DERMIS FAT SKIN GRAFT FULL THICKNESS LEFT EYE;  Surgeon: Floydene Flock, MD;  Location: [facility] OR;  Service: Ophthalmology;  Laterality: Left;   TUBAL LIGATION     VEIN HARVEST Left 09/26/2020   Procedure: LEFT LEG GREATER SAPHENOUS VEIN HARVEST;  Surgeon: Maeola Harman, MD;  Location: [facility] OR;  Service: Vascular;  Laterality: Left;    OB History     Gravida  2   Para  2   Term  1   Preterm  1   AB      Living  2      SAB      IAB      Ectopic      Multiple      Live Births               Home Medications    Prior to Admission medications   Medication Sig Start Date End Date Taking? Authorizing Provider  albuterol (PROVENTIL) (2.5 MG/3ML) 0.083% nebulizer solution Take 3 mLs (2.5 mg total) by nebulization every 12 (twelve) hours as needed for wheezing or shortness of breath. 08/05/22   Valentino Nose, NP  ALPRAZolam Prudy Feeler) 1 MG tablet Take 1 tablet (1 mg total) by mouth 4 (four) times daily as needed for anxiety. Patient taking differently: Take 1 mg by mouth 4 (four) times daily. 07/12/20   Rhetta Mura, MD  Artificial Tear Ointment (DRY EYES OP) Place 1 drop into the right eye 2 (two) times daily.    [provider]  Blood Glucose Monitoring Suppl (FORA V30A BLOOD GLUCOSE SYSTEM) DEVI  09/22/20   [provider]  busPIRone (BUSPAR) 7.5 MG tablet Take 1 tablet (7.5 mg total) by mouth 2 (two) times daily. 07/12/20   Rhetta Mura, MD  calcium carbonate (TUMS EX) 750 MG chewable tablet Chew 1-3 tablets by mouth See admin instructions. Take 3 tablets with each meal & take 1 tablets with each snack    [provider]  cinacalcet (SENSIPAR) 60 MG tablet Take 60 mg by mouth daily.    [provider]  diphenoxylate-atropine (LOMOTIL) 2.5-0.025 MG tablet Take 1  tablet by mouth 4 (four) times daily. 10/17/20   [provider]  dorzolamide-timolol (COSOPT) 22.3-6.8 MG/ML ophthalmic solution Place 1 drop into the right eye in the morning and at bedtime. 06/01/20   [provider]  doxercalciferol (HECTOROL) 4 MCG/2ML injection Inject 0.5 mLs (1 mcg total) into the vein every Monday, Wednesday, and Friday with hemodialysis. 12/03/18   Kari Baars, MD  FORACARE PREMIUM V10 TEST test strip  09/22/20   [provider]  gabapentin (NEURONTIN) 100 MG capsule Take 1 capsule (100 mg total) by mouth at bedtime. Patient taking differently: Take 300 mg by mouth at bedtime. 07/12/20   Rhetta Mura, MD  guaiFENesin (MUCINEX) 600 MG 12 hr tablet Take 1 tablet (600 mg total) by mouth 2 (two) times daily as needed for cough or to loosen phlegm. 08/05/22   Valentino Nose, NP  HYDROcodone-acetaminophen (NORCO) 10-325 MG tablet Take 1 tablet by mouth 4 (four) times daily. 08/30/20   [provider]  insulin aspart (NOVOLOG) 100 UNIT/ML injection Inject 0-9 Units into the skin 3 (three) times daily with meals. Patient taking differently: Inject 4-7 Units into the skin 3 (three) times daily with meals. Sliding scale insulin 12/02/18   Kari Baars, MD  Insulin Glargine [facility]) 100 UNIT/ML Inject 30 Units into the skin at bedtime. 07/12/20   Rhetta Mura, MD  latanoprost (XALATAN) 0.005 % ophthalmic solution Place 1 drop into the right eye 2 (two) times daily. 08/22/20   [provider]  lidocaine-prilocaine (EMLA) cream Apply 1 application topically as  needed (topical anesthesia for hemodialysis if Gebauers and Lidocaine injection are ineffective.). Patient taking differently: Apply 1 application  topically daily as needed (dialysis.). 04/25/17   Johnson, Clanford L, MD  omeprazole (PRILOSEC) 40 MG capsule Take 40 mg by mouth daily.    [provider]  PARoxetine (PAXIL) 20 MG tablet Take 1 tablet (20 mg total) by mouth every evening. This is to prevent panic attacks Patient taking differently: Take 20 mg by mouth at bedtime. 07/12/20   Rhetta Mura, MD  prednisoLONE acetate (PRED FORTE) 1 % ophthalmic suspension Place 1 drop into the right eye 2 (two) times daily. 08/27/20   [provider]  triamcinolone cream (KENALOG) 0.1 % Apply 1 Application topically 2 (two) times daily. 06/23/22   Particia Nearing, PA-C    Family History Family History  Adopted: Yes  Family history unknown: Yes    Social History Social History   Tobacco Use   Smoking status: Former    Packs/day: 1.50    Years: 25.00    Additional pack years: 0.00    Total pack years: 37.50    Types: Cigarettes    Quit date: 10/23/2007    Years since quitting: 15.0   Smokeless tobacco: Never  Vaping Use   Vaping Use: Never used  Substance Use Topics   Alcohol use: No   Drug use: No     Allergies   Codeine and Tape   Review of Systems Review of Systems  Constitutional:  Positive for activity change and fatigue. Negative for appetite change and fever.  HENT:  Positive for congestion. Negative for sinus pressure, sneezing and sore throat.   Respiratory:  Positive for cough, shortness of breath and wheezing.   Cardiovascular:  Negative for chest pain.  Gastrointestinal:  Negative for abdominal pain, diarrhea, nausea and vomiting.  Neurological:  Positive for weakness (Generalized). Negative for dizziness, light-headedness and headaches.     Physical Exam Triage  Vital Signs ED Triage Vitals [11/03/22 1906]  Enc Vitals Group      BP (!) 106/52     Pulse Rate 79     Resp (!) 25     Temp 98.3 F (36.8 C)     Temp Source Oral     SpO2 (!) 85 %     Weight      Height      Head Circumference      Peak Flow      Pain Score 8     Pain Loc      Pain Edu?      Excl. in GC?    No data found.  Updated Vital Signs BP (!) 106/52 (BP Location: Right Arm)   Pulse 79   Temp 98.3 F (36.8 C) (Oral)   Resp (!) 25   LMP 08/03/2008 (Exact Date)   SpO2 97%   Visual Acuity Right Eye Distance:   Left Eye Distance:   Bilateral Distance:    Right Eye Near:   Left Eye Near:    Bilateral Near:     Physical Exam Vitals reviewed.  Constitutional:      General: She is awake. She is not in acute distress.    Appearance: Normal appearance. She is well-developed. She is ill-appearing.     Comments: Very pleasant female appears older than stated age  HENT:     Head: Normocephalic and atraumatic.  Eyes:     General:        Left eye: Discharge present. Cardiovascular:     Rate and Rhythm: Normal rate and regular rhythm.     Heart sounds: Normal heart sounds, S1 normal and S2 normal. No murmur heard. Pulmonary:     Effort: Pulmonary effort is normal.     Breath sounds: Wheezing and rhonchi present. No rales.     Comments: Widespread wheezing and rhonchi Musculoskeletal:     Right lower leg: No edema.     Left lower leg: No edema.  Psychiatric:        Behavior: Behavior is cooperative.      UC Treatments / Results  Labs (all labs ordered are listed, but only abnormal results are displayed) Labs Reviewed - No data to display  EKG   Radiology No results found.  Procedures Procedures (including critical care time)  Medications Ordered in UC Medications  ipratropium-albuterol (DUONEB) 0.5-2.5 (3) MG/3ML nebulizer solution 3 mL (3 mLs Nebulization Given 11/03/22 1924)    Initial Impression / Assessment and Plan / UC Course  I have reviewed the triage vital signs and the nursing notes.  Pertinent labs &  imaging results that were available during my care of the patient were reviewed by me and considered in my medical decision making (see chart for details).     Patient was initially tachypneic and hypoxic on intake with oxygen saturation of 85% on room air.  She was put on 2 L and this improved to approximately 92%.  Chest x-ray was ordered which did not think that she could stand so ultimately this was discontinued.  She was given a DuoNeb in clinic and her oxygen saturation improved to 97% with 2 L of nasal cannula supplemental oxygen but when we tried to remove nasal cannula she dropped back down to approximately 82%.  Given her new oxygen requirement discussed that the safest thing to do is to go to the emergency room.  She was initially hesitant but ultimately did agree.  She was  transported by EMS and was stable at the time of discharge.  Final Clinical Impressions(s) / UC Diagnoses   Final diagnoses:  Acute respiratory failure with hypoxia  COPD exacerbation   Discharge Instructions   None    ED Prescriptions   None    PDMP not reviewed this encounter.   Jeani HawkingRaspet, Orrin Yurkovich K, PA-C 11/03/22 1955

## 2022-11-03 NOTE — ED Triage Notes (Signed)
Pt BIB [facility] from UC for cough and ShOB. UC admin 1 neb treatment and they reports 85% SpO2 on R/A. Pt is currently on 3L via O2. EMS report pt has a productive cough. Pt is legally blind.

## 2022-11-03 NOTE — ED Notes (Signed)
Patient is being discharged from the Urgent Care and sent to the Emergency Department via EMS . Per provider Erin R. , patient is in need of higher level of care due to SOB, decreased oxygen level. Patient is aware and verbalizes understanding of plan of care.  Vitals:   11/03/22 1923 11/03/22 1934  BP:    Pulse:    Resp:    Temp:    SpO2: 93% 97%

## 2022-11-03 NOTE — ED Triage Notes (Addendum)
Pt c/o cough,nasal congestion, head congestion, S.O.B. difficulty breathing x1 week

## 2022-11-04 ENCOUNTER — Other Ambulatory Visit: Payer: Self-pay

## 2022-11-04 DIAGNOSIS — I5033 Acute on chronic diastolic (congestive) heart failure: Secondary | ICD-10-CM | POA: Diagnosis not present

## 2022-11-04 DIAGNOSIS — I5032 Chronic diastolic (congestive) heart failure: Secondary | ICD-10-CM | POA: Diagnosis present

## 2022-11-04 DIAGNOSIS — R9431 Abnormal electrocardiogram [ECG] [EKG]: Secondary | ICD-10-CM

## 2022-11-04 DIAGNOSIS — R0902 Hypoxemia: Secondary | ICD-10-CM | POA: Diagnosis present

## 2022-11-04 DIAGNOSIS — J9601 Acute respiratory failure with hypoxia: Secondary | ICD-10-CM | POA: Diagnosis present

## 2022-11-04 DIAGNOSIS — K7460 Unspecified cirrhosis of liver: Secondary | ICD-10-CM | POA: Diagnosis present

## 2022-11-04 DIAGNOSIS — G4733 Obstructive sleep apnea (adult) (pediatric): Secondary | ICD-10-CM | POA: Diagnosis present

## 2022-11-04 DIAGNOSIS — E11319 Type 2 diabetes mellitus with unspecified diabetic retinopathy without macular edema: Secondary | ICD-10-CM | POA: Diagnosis present

## 2022-11-04 DIAGNOSIS — J441 Chronic obstructive pulmonary disease with (acute) exacerbation: Secondary | ICD-10-CM | POA: Diagnosis present

## 2022-11-04 DIAGNOSIS — R0789 Other chest pain: Secondary | ICD-10-CM

## 2022-11-04 DIAGNOSIS — R7989 Other specified abnormal findings of blood chemistry: Secondary | ICD-10-CM

## 2022-11-04 DIAGNOSIS — E876 Hypokalemia: Secondary | ICD-10-CM | POA: Diagnosis present

## 2022-11-04 DIAGNOSIS — N186 End stage renal disease: Secondary | ICD-10-CM | POA: Diagnosis present

## 2022-11-04 DIAGNOSIS — Z794 Long term (current) use of insulin: Secondary | ICD-10-CM | POA: Diagnosis not present

## 2022-11-04 DIAGNOSIS — D631 Anemia in chronic kidney disease: Secondary | ICD-10-CM | POA: Diagnosis present

## 2022-11-04 DIAGNOSIS — E1122 Type 2 diabetes mellitus with diabetic chronic kidney disease: Secondary | ICD-10-CM | POA: Diagnosis present

## 2022-11-04 DIAGNOSIS — J129 Viral pneumonia, unspecified: Secondary | ICD-10-CM | POA: Diagnosis present

## 2022-11-04 DIAGNOSIS — Z1152 Encounter for screening for COVID-19: Secondary | ICD-10-CM | POA: Diagnosis not present

## 2022-11-04 DIAGNOSIS — G2581 Restless legs syndrome: Secondary | ICD-10-CM | POA: Diagnosis present

## 2022-11-04 DIAGNOSIS — D696 Thrombocytopenia, unspecified: Secondary | ICD-10-CM | POA: Diagnosis present

## 2022-11-04 DIAGNOSIS — K7581 Nonalcoholic steatohepatitis (NASH): Secondary | ICD-10-CM | POA: Diagnosis present

## 2022-11-04 DIAGNOSIS — F319 Bipolar disorder, unspecified: Secondary | ICD-10-CM | POA: Diagnosis present

## 2022-11-04 DIAGNOSIS — E785 Hyperlipidemia, unspecified: Secondary | ICD-10-CM | POA: Diagnosis present

## 2022-11-04 DIAGNOSIS — J440 Chronic obstructive pulmonary disease with acute lower respiratory infection: Secondary | ICD-10-CM | POA: Diagnosis present

## 2022-11-04 DIAGNOSIS — Z6841 Body Mass Index (BMI) 40.0 and over, adult: Secondary | ICD-10-CM | POA: Diagnosis not present

## 2022-11-04 DIAGNOSIS — Z992 Dependence on renal dialysis: Secondary | ICD-10-CM | POA: Diagnosis not present

## 2022-11-04 DIAGNOSIS — I2489 Other forms of acute ischemic heart disease: Secondary | ICD-10-CM | POA: Diagnosis present

## 2022-11-04 LAB — CBC WITH DIFFERENTIAL/PLATELET
Abs Immature Granulocytes: 0 10*3/uL (ref 0.00–0.07)
Band Neutrophils: 4 %
Basophils Absolute: 0 10*3/uL (ref 0.0–0.1)
Basophils Relative: 0 %
Eosinophils Absolute: 0 10*3/uL (ref 0.0–0.5)
Eosinophils Relative: 1 %
HCT: 26.7 % — ABNORMAL LOW (ref 36.0–46.0)
Hemoglobin: 8.2 g/dL — ABNORMAL LOW (ref 12.0–15.0)
Lymphocytes Relative: 32 %
Lymphs Abs: 1.1 10*3/uL (ref 0.7–4.0)
MCH: 25.9 pg — ABNORMAL LOW (ref 26.0–34.0)
MCHC: 30.7 g/dL (ref 30.0–36.0)
MCV: 84.2 fL (ref 80.0–100.0)
Monocytes Absolute: 0.3 10*3/uL (ref 0.1–1.0)
Monocytes Relative: 8 %
Neutro Abs: 2.1 10*3/uL (ref 1.7–7.7)
Neutrophils Relative %: 55 %
Platelets: 116 10*3/uL — ABNORMAL LOW (ref 150–400)
RBC: 3.17 MIL/uL — ABNORMAL LOW (ref 3.87–5.11)
RDW: 19 % — ABNORMAL HIGH (ref 11.5–15.5)
WBC: 3.5 10*3/uL — ABNORMAL LOW (ref 4.0–10.5)
nRBC: 0 % (ref 0.0–0.2)
nRBC: 1 /100 WBC — ABNORMAL HIGH

## 2022-11-04 LAB — BASIC METABOLIC PANEL
Anion gap: 16 — ABNORMAL HIGH (ref 5–15)
BUN: 39 mg/dL — ABNORMAL HIGH (ref 6–20)
CO2: 24 mmol/L (ref 22–32)
Calcium: 9.2 mg/dL (ref 8.9–10.3)
Chloride: 94 mmol/L — ABNORMAL LOW (ref 98–111)
Creatinine, Ser: 9.79 mg/dL — ABNORMAL HIGH (ref 0.44–1.00)
GFR, Estimated: 4 mL/min — ABNORMAL LOW (ref >60)
Glucose, Bld: 132 mg/dL — ABNORMAL HIGH (ref 70–99)
Potassium: 3 mmol/L — ABNORMAL LOW (ref 3.5–5.1)
Sodium: 134 mmol/L — ABNORMAL LOW (ref 135–145)

## 2022-11-04 LAB — TROPONIN I (HIGH SENSITIVITY)
Troponin I (High Sensitivity): 103 ng/L (ref <18)
Troponin I (High Sensitivity): 93 ng/L — ABNORMAL HIGH (ref <18)

## 2022-11-04 LAB — HEMOGLOBIN A1C
Hgb A1c MFr Bld: 5.6 % (ref 4.8–5.6)
Mean Plasma Glucose: 114.02 mg/dL

## 2022-11-04 LAB — C-REACTIVE PROTEIN: CRP: 10.8 mg/dL — ABNORMAL HIGH (ref <1.0)

## 2022-11-04 LAB — HEPATITIS B SURFACE ANTIGEN: Hepatitis B Surface Ag: NONREACTIVE

## 2022-11-04 LAB — CBG MONITORING, ED: Glucose-Capillary: 270 mg/dL — ABNORMAL HIGH (ref 70–99)

## 2022-11-04 LAB — PROCALCITONIN: Procalcitonin: 1.83 ng/mL

## 2022-11-04 LAB — HIV ANTIBODY (ROUTINE TESTING W REFLEX): HIV Screen 4th Generation wRfx: NONREACTIVE

## 2022-11-04 LAB — C DIFFICILE QUICK SCREEN W PCR REFLEX
C Diff antigen: NEGATIVE
C Diff interpretation: NOT DETECTED
C Diff toxin: NEGATIVE

## 2022-11-04 LAB — BRAIN NATRIURETIC PEPTIDE: B Natriuretic Peptide: 398 pg/mL — ABNORMAL HIGH (ref 0.0–100.0)

## 2022-11-04 LAB — GLUCOSE, CAPILLARY: Glucose-Capillary: 204 mg/dL — ABNORMAL HIGH (ref 70–99)

## 2022-11-04 MED ORDER — METHYLPREDNISOLONE SODIUM SUCC 40 MG IJ SOLR
40.0000 mg | Freq: Two times a day (BID) | INTRAMUSCULAR | Status: DC
  Administered 2022-11-05: 40 mg via INTRAVENOUS
  Filled 2022-11-04: qty 1

## 2022-11-04 MED ORDER — INSULIN GLARGINE-YFGN 100 UNIT/ML ~~LOC~~ SOLN
30.0000 [IU] | Freq: Every day | SUBCUTANEOUS | Status: DC
  Administered 2022-11-04 – 2022-11-05 (×2): 30 [IU] via SUBCUTANEOUS
  Filled 2022-11-04 (×3): qty 0.3

## 2022-11-04 MED ORDER — IPRATROPIUM-ALBUTEROL 0.5-2.5 (3) MG/3ML IN SOLN
3.0000 mL | RESPIRATORY_TRACT | Status: AC
  Administered 2022-11-04 (×3): 3 mL via RESPIRATORY_TRACT
  Filled 2022-11-04: qty 9

## 2022-11-04 MED ORDER — METHYLPREDNISOLONE SODIUM SUCC 125 MG IJ SOLR
125.0000 mg | Freq: Once | INTRAMUSCULAR | Status: AC
  Administered 2022-11-04: 125 mg via INTRAVENOUS
  Filled 2022-11-04: qty 2

## 2022-11-04 MED ORDER — PANTOPRAZOLE SODIUM 40 MG PO TBEC
40.0000 mg | DELAYED_RELEASE_TABLET | Freq: Every day | ORAL | Status: DC
  Administered 2022-11-04 – 2022-11-05 (×2): 40 mg via ORAL
  Filled 2022-11-04 (×2): qty 1

## 2022-11-04 MED ORDER — HEPARIN SODIUM (PORCINE) 1000 UNIT/ML IJ SOLN
INTRAMUSCULAR | Status: AC
  Filled 2022-11-04: qty 5

## 2022-11-04 MED ORDER — GABAPENTIN 100 MG PO CAPS
100.0000 mg | ORAL_CAPSULE | Freq: Every day | ORAL | Status: DC
  Administered 2022-11-04: 100 mg via ORAL
  Filled 2022-11-04: qty 1

## 2022-11-04 MED ORDER — ACETAMINOPHEN 650 MG RE SUPP: 650.0000 mg | Freq: Four times a day (QID) | RECTAL | Status: DC | PRN

## 2022-11-04 MED ORDER — INSULIN ASPART 100 UNIT/ML IJ SOLN
0.0000 [IU] | Freq: Three times a day (TID) | INTRAMUSCULAR | Status: DC
  Administered 2022-11-04: 3 [IU] via SUBCUTANEOUS
  Administered 2022-11-05: 1 [IU] via SUBCUTANEOUS
  Filled 2022-11-04: qty 1

## 2022-11-04 MED ORDER — INSULIN ASPART 100 UNIT/ML IJ SOLN
0.0000 [IU] | Freq: Every day | INTRAMUSCULAR | Status: DC
  Administered 2022-11-04: 2 [IU] via SUBCUTANEOUS

## 2022-11-04 MED ORDER — LOPERAMIDE HCL 2 MG PO CAPS
2.0000 mg | ORAL_CAPSULE | Freq: Once | ORAL | Status: AC
  Administered 2022-11-04: 2 mg via ORAL
  Filled 2022-11-04: qty 1

## 2022-11-04 MED ORDER — POLYVINYL ALCOHOL 1.4 % OP SOLN: 1.0000 [drp] | OPHTHALMIC | Status: DC | PRN

## 2022-11-04 MED ORDER — CHLORHEXIDINE GLUCONATE CLOTH 2 % EX PADS
6.0000 | MEDICATED_PAD | Freq: Every day | CUTANEOUS | Status: DC
  Administered 2022-11-05: 6 via TOPICAL

## 2022-11-04 MED ORDER — CINACALCET HCL 30 MG PO TABS
60.0000 mg | ORAL_TABLET | Freq: Every day | ORAL | Status: DC
  Administered 2022-11-05: 60 mg via ORAL
  Filled 2022-11-04 (×2): qty 2

## 2022-11-04 MED ORDER — CALCITRIOL 0.25 MCG PO CAPS: 1.7500 ug | ORAL_CAPSULE | ORAL | Status: DC

## 2022-11-04 MED ORDER — HEPARIN SODIUM (PORCINE) 1000 UNIT/ML DIALYSIS
20.0000 [IU]/kg | INTRAMUSCULAR | Status: DC | PRN
  Administered 2022-11-04: 2500 [IU] via INTRAVENOUS_CENTRAL

## 2022-11-04 MED ORDER — ALPRAZOLAM 1 MG PO TABS
1.0000 mg | ORAL_TABLET | Freq: Four times a day (QID) | ORAL | Status: DC | PRN
  Administered 2022-11-04 – 2022-11-05 (×3): 1 mg via ORAL
  Filled 2022-11-04 (×2): qty 1
  Filled 2022-11-04: qty 2

## 2022-11-04 MED ORDER — CALCITRIOL 0.25 MCG PO CAPS: 1.5000 ug | ORAL_CAPSULE | ORAL | Status: DC

## 2022-11-04 MED ORDER — HYDROCOD POLI-CHLORPHE POLI ER 10-8 MG/5ML PO SUER: 5.0000 mL | Freq: Two times a day (BID) | ORAL | Status: DC | PRN

## 2022-11-04 MED ORDER — LIDOCAINE-PRILOCAINE 2.5-2.5 % EX CREA: 1.0000 | TOPICAL_CREAM | CUTANEOUS | Status: DC | PRN

## 2022-11-04 MED ORDER — DOXERCALCIFEROL 4 MCG/2ML IV SOLN
3.0000 ug | INTRAVENOUS | Status: DC
  Administered 2022-11-04: 3 ug via INTRAVENOUS

## 2022-11-04 MED ORDER — DARBEPOETIN ALFA 200 MCG/0.4ML IJ SOSY
PREFILLED_SYRINGE | INTRAMUSCULAR | Status: AC
  Filled 2022-11-04: qty 0.4

## 2022-11-04 MED ORDER — DOXERCALCIFEROL 4 MCG/2ML IV SOLN
INTRAVENOUS | Status: AC
  Filled 2022-11-04: qty 2

## 2022-11-04 MED ORDER — HYDROCODONE-ACETAMINOPHEN 10-325 MG PO TABS
1.0000 | ORAL_TABLET | Freq: Four times a day (QID) | ORAL | Status: DC | PRN
  Administered 2022-11-04: 1 via ORAL
  Filled 2022-11-04: qty 1

## 2022-11-04 MED ORDER — TIMOLOL MALEATE 0.5 % OP SOLN
1.0000 [drp] | Freq: Every day | OPHTHALMIC | Status: DC
  Administered 2022-11-04 – 2022-11-05 (×2): 1 [drp] via OPHTHALMIC
  Filled 2022-11-04 (×2): qty 5

## 2022-11-04 MED ORDER — BUDESONIDE 0.25 MG/2ML IN SUSP
0.2500 mg | Freq: Two times a day (BID) | RESPIRATORY_TRACT | Status: DC
  Administered 2022-11-04 – 2022-11-05 (×3): 0.25 mg via RESPIRATORY_TRACT
  Filled 2022-11-04 (×3): qty 2

## 2022-11-04 MED ORDER — SODIUM CHLORIDE 0.9 % IV SOLN
500.0000 mg | INTRAVENOUS | Status: DC
  Administered 2022-11-04: 500 mg via INTRAVENOUS
  Filled 2022-11-04 (×2): qty 5

## 2022-11-04 MED ORDER — LATANOPROST 0.005 % OP SOLN
1.0000 [drp] | Freq: Two times a day (BID) | OPHTHALMIC | Status: DC
  Administered 2022-11-04 – 2022-11-05 (×3): 1 [drp] via OPHTHALMIC
  Filled 2022-11-04 (×3): qty 2.5

## 2022-11-04 MED ORDER — PAROXETINE HCL 20 MG PO TABS
20.0000 mg | ORAL_TABLET | Freq: Every day | ORAL | Status: DC
  Administered 2022-11-04 – 2022-11-05 (×2): 20 mg via ORAL
  Filled 2022-11-04 (×2): qty 1

## 2022-11-04 MED ORDER — LOPERAMIDE HCL 2 MG PO CAPS
4.0000 mg | ORAL_CAPSULE | Freq: Once | ORAL | Status: AC
  Administered 2022-11-04: 4 mg via ORAL
  Filled 2022-11-04 (×3): qty 2

## 2022-11-04 MED ORDER — DOXYCYCLINE HYCLATE 100 MG PO TABS
100.0000 mg | ORAL_TABLET | Freq: Once | ORAL | Status: AC
  Administered 2022-11-04: 100 mg via ORAL
  Filled 2022-11-04: qty 1

## 2022-11-04 MED ORDER — HEPARIN SODIUM (PORCINE) 5000 UNIT/ML IJ SOLN
5000.0000 [IU] | Freq: Three times a day (TID) | INTRAMUSCULAR | Status: DC
  Administered 2022-11-04 – 2022-11-05 (×2): 5000 [IU] via SUBCUTANEOUS
  Filled 2022-11-04 (×2): qty 1

## 2022-11-04 MED ORDER — ACETAMINOPHEN 325 MG PO TABS: 650.0000 mg | ORAL_TABLET | Freq: Four times a day (QID) | ORAL | Status: DC | PRN

## 2022-11-04 MED ORDER — PENTAFLUOROPROP-TETRAFLUOROETH EX AERO: 1.0000 | INHALATION_SPRAY | CUTANEOUS | Status: DC | PRN

## 2022-11-04 MED ORDER — ONDANSETRON HCL 4 MG/2ML IJ SOLN
INTRAMUSCULAR | Status: AC
  Filled 2022-11-04: qty 2

## 2022-11-04 MED ORDER — SODIUM CHLORIDE 0.9% FLUSH
3.0000 mL | Freq: Two times a day (BID) | INTRAVENOUS | Status: DC
  Administered 2022-11-04 – 2022-11-05 (×3): 3 mL via INTRAVENOUS

## 2022-11-04 MED ORDER — IPRATROPIUM-ALBUTEROL 0.5-2.5 (3) MG/3ML IN SOLN
3.0000 mL | Freq: Four times a day (QID) | RESPIRATORY_TRACT | Status: DC
  Administered 2022-11-04 – 2022-11-05 (×4): 3 mL via RESPIRATORY_TRACT
  Filled 2022-11-04 (×5): qty 3

## 2022-11-04 MED ORDER — BUSPIRONE HCL 5 MG PO TABS
7.5000 mg | ORAL_TABLET | Freq: Two times a day (BID) | ORAL | Status: DC
  Administered 2022-11-04 – 2022-11-05 (×3): 7.5 mg via ORAL
  Filled 2022-11-04 (×3): qty 2

## 2022-11-04 MED ORDER — ONDANSETRON HCL 4 MG PO TABS: 4.0000 mg | ORAL_TABLET | Freq: Four times a day (QID) | ORAL | Status: DC | PRN

## 2022-11-04 MED ORDER — DARBEPOETIN ALFA 200 MCG/0.4ML IJ SOSY
200.0000 ug | PREFILLED_SYRINGE | INTRAMUSCULAR | Status: DC
  Administered 2022-11-04: 200 ug via SUBCUTANEOUS
  Filled 2022-11-04: qty 0.4

## 2022-11-04 MED ORDER — ONDANSETRON HCL 4 MG/2ML IJ SOLN
4.0000 mg | Freq: Four times a day (QID) | INTRAMUSCULAR | Status: DC | PRN
  Administered 2022-11-04 – 2022-11-05 (×3): 4 mg via INTRAVENOUS
  Filled 2022-11-04 (×2): qty 2

## 2022-11-04 MED ORDER — LIDOCAINE HCL (PF) 1 % IJ SOLN: 5.0000 mL | INTRAMUSCULAR | Status: DC | PRN

## 2022-11-04 NOTE — ED Notes (Signed)
Pt remains in dialysis

## 2022-11-04 NOTE — TOC Progression Note (Signed)
  Transition of Care [facility]) Screening Note   Patient Details  Name: Martha [name] Date of Birth: 03/14/1966   Transition of Care [facility]) CM/SW Contact:    Leitha Bleak, RN Phone Number: 11/04/2022, 11:41 AM    Transition of Care Department [facility]) has reviewed patient and no TOC needs have been identified at this time. We will continue to monitor patient advancement through interdisciplinary progression rounds. If new patient transition needs arise, please place a TOC consult.     Barriers to Discharge: Continued Medical Work up  Expected Discharge Plan and Services     Living arrangements for the past 2 months: Single Family Home                    Social Determinants of Health (SDOH) Interventions SDOH Screenings   Tobacco Use: Medium Risk (11/03/2022)    Readmission Risk Interventions    10/18/2021   12:12 PM 07/09/2020    3:25 PM  Readmission Risk Prevention Plan  Transportation Screening Complete Complete  HRI or Home Care Consult Complete   Social Work Consult for Recovery Care Planning/Counseling Complete   Palliative Care Screening Not Applicable   Medication Review Oceanographer) Complete Complete  PCP or Specialist appointment within 3-5 days of discharge  Not Complete  PCP/Specialist Appt Not Complete comments  Discharging to SNF  HRI or Home Care Consult  Complete  SW Recovery Care/Counseling Consult  Complete  Palliative Care Screening  Not Applicable  Skilled Nursing Facility  Complete

## 2022-11-04 NOTE — Consult Note (Signed)
Reason for Consult: To manage dialysis and dialysis related needs Referring Physician: [name] is an 57 y.o. female with DM, bipolar, COPD, NASH, OSA, HTN, morbid obesity and blindness as well as ESRD TTS at [facility].  She presents with not feeling right.  URI sxms-  presented to medical attn and found to be hypoxic so sent to ER-  CXR =bronchitic change-  reticulonodular opacities bilat-  no overt edema.  Pt relates a history of losing weight and not having much to be taken off at the kidney center-  I suspect her EDW needs lowering and that could be part of the issue with her hypoxia.  Planning to do HD today on schedule   Outpatient HD unit:  [facility] -   270 minutes  TTS BF 400 -  AVF Dialysate flow is 500  2K/Ca 2.5 EDW - 127.5 kg-  they have been challenging  Meds:  calcitriol 1.5 mcg po each tx Mircera 200 mcg every 2 weeks Venofer 50 mg weekly   Heparin 1000 bolus-  800 per hour   Past Medical History:  Diagnosis Date   Acid reflux    takes Tums   Anemia    Arthritis    Bipolar 1 disorder    Blindness of left eye    left eye removed   Carpal tunnel syndrome, bilateral    Cervical radiculopathy    CHF (congestive heart failure)    Chronic kidney disease    Stage 5- 01/25/17   Constipation    COPD (chronic obstructive pulmonary disease) 2014   bronchitis   Degenerative disc disease, thoracic    Depression    Diabetes mellitus    Type II   Diabetic retinopathy    Dyspnea    when walking   End stage kidney disease    M, W, F [facility]   Gout    Hypertension    Non-alcoholic cirrhosis    Noncompliance with medication regimen    Noncompliance with medication regimen    Obesity (BMI 30-39.9)    OSA (obstructive sleep apnea)    cpap   Panic attack    RLS (restless legs syndrome)     Past Surgical History:  Procedure Laterality Date   AORTIC ARCH ANGIOGRAPHY N/A 09/11/2020   Procedure: AORTIC ARCH ANGIOGRAPHY;  Surgeon:  Nada Libman, MD;  [facility]: [facility];  Service: Cardiovascular;  Laterality: N/A;   AV FISTULA PLACEMENT Right 01/27/2017   Procedure: ARTERIOVENOUS (AV) FISTULA CREATION-RIGHT ARM;  Surgeon: Sherren Kerns, MD;  Location: [facility] OR;  Service: Vascular;  Laterality: Right;   AV FISTULA PLACEMENT Left 08/03/2018   Procedure: LEFT ARTERIOVENOUS (AV) FISTULA CREATION;  Surgeon: Sherren Kerns, MD;  Location: [facility] OR;  Service: Vascular;  Laterality: Left;   BASCILIC VEIN TRANSPOSITION Left 04/26/2019   Procedure: SECOND STAGE BASILIC VEIN TRANSPOSITION LEFT ARM;  Surgeon: Sherren Kerns, MD;  Location: [facility] OR;  Service: Vascular;  Laterality: Left;   BIOPSY N/A 05/17/2013   Procedure: BIOPSY;  Surgeon: West Bali, MD;  Location: [facility] ORS;  Service: Endoscopy;  Laterality: N/A;   CARPAL TUNNEL RELEASE Left 11/25/2018   Procedure: LEFT CARPAL TUNNEL RELEASE;  Surgeon: Dairl Ponder, MD;  Location: [facility] OR;  Service: Orthopedics;  Laterality: Left;   CATARACT EXTRACTION Right    CHOLECYSTECTOMY N/A 04/27/2014   Procedure: LAPAROSCOPIC CHOLECYSTECTOMY;  Surgeon: Marlane Hatcher, MD;  Location: [facility] ORS;  Service: General;  Laterality: N/A;  COLONOSCOPY  06/06/2010   ZOX:WRUEAV bleeding secondary to internal hemorrhoids but incomplete evaluation secondary to poor right colon prep/small rectal and sigmoid colon polyps (hyperplastic). PROPOFOL   COLONOSCOPY  May 2013   Dr. Gilliam/[facility]: 5 mm a descending colon polyp, hyperplastic. Adequate bowel prep.   ENDOMETRIAL ABLATION     ENUCLEATION Left 02/04/2018   ENUCLEATION WITH PLACEMENT OF IMPLANT LEFT EYE   ENUCLEATION Left 02/04/2018   Procedure: ENUCLEATION WITH PLACEMENT OF IMPLANT LEFT EYE;  Surgeon: Floydene Flock, MD;  Location: [facility] OR;  Service: Ophthalmology;  Laterality: Left;   ESOPHAGOGASTRODUODENOSCOPY  06/06/2010   WUJ:WJXBJYN erythema and edema of body of stomach, with sessile polypoid lesions. bx benign. no h.pylori    ESOPHAGOGASTRODUODENOSCOPY (EGD) WITH PROPOFOL N/A 05/17/2013   Dr. Darrick Penna: normal esophagus, moderate nodular gastritis, negative path, empiric Savary dilation   EYE SURGERY  07/2017   sx for glaucoma   FLEXIBLE SIGMOIDOSCOPY N/A 05/17/2013   Dr. Darrick Penna: moderate sized internal hemorrhoids   HEMORRHOID BANDING N/A 05/17/2013   Procedure: HEMORRHOID BANDING;  Surgeon: West Bali, MD;  Location: [facility];  Service: Endoscopy;  Laterality: N/A;  2 bands placed   INCISION AND DRAINAGE ABSCESS Left 10/11/2013   Procedure: INCISION AND DRAINAGE AND DEBRIDEMENT LEFT BREAST  ABSCESS;  Surgeon: Marlane Hatcher, MD;  Location: [facility];  Service: General;  Laterality: Left;   INSERTION OF DIALYSIS CATHETER N/A 06/08/2018   Procedure: INSERTION OF Right internal jugular TUNNELED  DIALYSIS CATHETER;  Surgeon: Chuck Hint, MD;  Location: [facility] OR;  Service: Vascular;  Laterality: N/A;   IRRIGATION AND DEBRIDEMENT ABSCESS Right 06/01/2013   Procedure: INCISION AND DRAINAGE AND DEBRIDEMENT ABSCESS RIGHT BREAST;  Surgeon: Marlane Hatcher, MD;  Location: [facility];  Service: General;  Laterality: Right;   LEFT EYE REMOVED Left 01/2018   [facility] [address].   LIGATION OF ARTERIOVENOUS  FISTULA Right 06/08/2018   Procedure: LIGATION OF ARTERIOVENOUS  FISTULA RIGHT ARM;  Surgeon: Chuck Hint, MD;  Location: [facility] OR;  Service: Vascular;  Laterality: Right;   ORIF ANKLE FRACTURE Right 12/17/2018   Procedure: OPEN REDUCTION INTERNAL FIXATION (ORIF) ANKLE FRACTURE;  Surgeon: Felecia Shelling, DPM;  Location: [facility] OR;  Service: Podiatry;  Laterality: Right;   REVISON OF ARTERIOVENOUS FISTULA Left 09/26/2020   Procedure: LEFT ARTERIOVENOUS FISTULA  DISTAL REVASCULARIZATION AND LIGATION ( DRIL ) USING GREATER SAPHENOUS VEIN;  Surgeon: Maeola Harman, MD;  Location: [facility] OR;  Service: Vascular;  Laterality: Left;   SAVORY DILATION N/A 05/17/2013   Procedure: SAVORY DILATION;   Surgeon: West Bali, MD;  Location: [facility]S;  Service: Endoscopy;  Laterality: N/A;  14/15/16   SKIN FULL THICKNESS GRAFT Left 05/05/2018   Procedure: ABDOMINAL DERMIS FAT SKIN GRAFT FULL THICKNESS LEFT EYE;  Surgeon: Floydene Flock, MD;  Location[facility] OR;  Service: Ophthalmology;  Laterality: Left;   TUBAL LIGATION     VEIN HARVEST Left 09/26/2020   Procedure: LEFT LEG GREATER SAPHENOUS VEIN HARVEST;  Surgeon: Maeola Harman, MD;  Location: [facility] OR;  Service: Vascular;  Laterality: Left;    Family History  Adopted: Yes  Family history unknown: Yes    Social History:  reports that she quit smoking about 15 years ago. Her smoking use included cigarettes. She has a 37.50 pack-year smoking history. She has never used smokeless tobacco. She reports that she does not drink alcohol and does not use drugs.  Allergies:  Allergies  Allergen Reactions  Codeine Nausea And Vomiting   Tape Rash and Other (See Comments)    Pull skin off.  Paper tape is ok    Medications: I have reviewed the patient's current medications.  Results for orders placed or performed during the hospital encounter of 11/03/22 (from the past 48 hour(s))  SARS Coronavirus 2 by RT PCR (hospital order, performed in [facility] hospital lab) *cepheid single result test* Anterior Nasal Swab     Status: None   Collection Time: 11/03/22  8:33 PM   Specimen: Anterior Nasal Swab  Result Value Ref Range   SARS Coronavirus 2 by RT PCR NEGATIVE NEGATIVE    Comment: (NOTE) SARS-CoV-2 target nucleic acids are NOT DETECTED.  The SARS-CoV-2 RNA is generally detectable in upper and lower respiratory specimens during the acute phase of infection. The lowest concentration of SARS-CoV-2 viral copies this assay can detect is 250 copies / mL. A negative result does not preclude SARS-CoV-2 infection and should not be used as the sole basis for treatment or other patient management decisions.  A negative result may occur  with improper specimen collection / handling, submission of specimen other than nasopharyngeal swab, presence of viral mutation(s) within the areas targeted by this assay, and inadequate number of viral copies (<250 copies / mL). A negative result must be combined with clinical observations, patient history, and epidemiological information.  Fact Sheet for Patients:   RoadLapTop.co.za  Fact Sheet for Healthcare Providers: http://kim-miller.com/  This test is not yet approved or  cleared by the [place] FDA and has been authorized for detection and/or diagnosis of SARS-CoV-2 by FDA under an Emergency Use Authorization (EUA).  This EUA will remain in effect (meaning this test can be used) for the duration of the COVID-19 declaration under Section 564(b)(1) of the Act, 21 U.S.C. section 360bbb-3(b)(1), unless the authorization is terminated or revoked sooner.  Performed at [facility], [address] [place], [place] [address]   CBC with Differential     Status: Abnormal   Collection Time: 11/04/22 12:24 AM  Result Value Ref Range   WBC 3.5 (L) 4.0 - 10.5 K/uL   RBC 3.17 (L) 3.87 - 5.11 MIL/uL   Hemoglobin 8.2 (L) 12.0 - 15.0 g/dL   HCT 02.4 (L) 09.7 - 35.3 %   MCV 84.2 80.0 - 100.0 fL   MCH 25.9 (L) 26.0 - 34.0 pg   MCHC 30.7 30.0 - 36.0 g/dL   RDW 29.9 (H) 24.2 - 68.3 %   Platelets 116 (L) 150 - 400 K/uL   nRBC 0.0 0.0 - 0.2 %   Neutrophils Relative % 55 %   Neutro Abs 2.1 1.7 - 7.7 K/uL   Band Neutrophils 4 %   Lymphocytes Relative 32 %   Lymphs Abs 1.1 0.7 - 4.0 K/uL   Monocytes Relative 8 %   Monocytes Absolute 0.3 0.1 - 1.0 K/uL   Eosinophils Relative 1 %   Eosinophils Absolute 0.0 0.0 - 0.5 K/uL   Basophils Relative 0 %   Basophils Absolute 0.0 0.0 - 0.1 K/uL   WBC Morphology See Note     Comment: REACTIVE LYMPHOCYTES PRESENT     RBC Morphology See Note     Comment: ANISOCYTOSIS PRESENT   Smear Review See Note      Comment: LARGE PLATELETS PRESENT   nRBC 1 (H) 0 /100 WBC   Abs Immature Granulocytes 0.00 0.00 - 0.07 K/uL   Polychromasia PRESENT    Basophilic Stippling PRESENT  Comment: Performed at [facility], [address], [place], [place] [address]  Basic metabolic panel     Status: Abnormal   Collection Time: 11/04/22 12:24 AM  Result Value Ref Range   Sodium 134 (L) 135 - 145 mmol/L   Potassium 3.0 (L) 3.5 - 5.1 mmol/L   Chloride 94 (L) 98 - 111 mmol/L   CO2 24 22 - 32 mmol/L   Glucose, Bld 132 (H) 70 - 99 mg/dL    Comment: Glucose reference range applies only to samples taken after fasting for at least 8 hours.   BUN 39 (H) 6 - 20 mg/dL   Creatinine, Ser 9.47 (H) 0.44 - 1.00 mg/dL   Calcium 9.2 8.9 - 65.4 mg/dL   GFR, Estimated 4 (L) >60 mL/min    Comment: (NOTE) Calculated using the CKD-EPI Creatinine Equation (2021)    Anion gap 16 (H) 5 - 15    Comment: Performed at [facility], [address] [place], [place] [address]  Troponin I (High Sensitivity)     Status: Abnormal   Collection Time: 11/04/22 12:24 AM  Result Value Ref Range   Troponin I (High Sensitivity) 103 (HH) <18 ng/L    Comment: CRITICAL RESULT CALLED TO, READ BACK BY AND VERIFIED WITH R. THOMPSON AT 0102 ON 04.09.24 BY ADGER J (NOTE) Elevated high sensitivity troponin I (hsTnI) values and significant  changes across serial measurements may suggest ACS but many other  chronic and acute conditions are known to elevate hsTnI results.  Refer to the "Links" section for chest pain algorithms and additional  guidance. Performed at [facility], [address] [place], [place] [address]   Brain natriuretic peptide     Status: Abnormal   Collection Time: 11/04/22 12:24 AM  Result Value Ref Range   B Natriuretic Peptide 398.0 (H) 0.0 - 100.0 pg/mL    Comment: Performed at [facility], [address] [place], [place] [address]  Troponin I (High Sensitivity)     Status: Abnormal   Collection Time: 11/04/22  1:27  AM  Result Value Ref Range   Troponin I (High Sensitivity) 93 (H) <18 ng/L    Comment: (NOTE) Elevated high sensitivity troponin I (hsTnI) values and significant  changes across serial measurements may suggest ACS but many other  chronic and acute conditions are known to elevate hsTnI results.  Refer to the "Links" section for chest pain algorithms and additional  guidance. Performed at [facility], [address], [place], [place] [address]   Procalcitonin     Status: None   Collection Time: 11/04/22  7:04 AM  Result Value Ref Range   Procalcitonin 1.83 ng/mL    Comment:        Interpretation: PCT > 0.5 ng/mL and <= 2 ng/mL: Systemic infection (sepsis) is possible, but other conditions are known to elevate PCT as well. (NOTE)       Sepsis PCT Algorithm           Lower Respiratory Tract                                      Infection PCT Algorithm    ----------------------------     ----------------------------         PCT < 0.25 ng/mL                PCT < 0.10 ng/mL  Strongly encourage             Strongly discourage   discontinuation of antibiotics    initiation of antibiotics    ----------------------------     -----------------------------       PCT 0.25 - 0.50 ng/mL            PCT 0.10 - 0.25 ng/mL               OR       >80% decrease in PCT            Discourage initiation of                                            antibiotics      Encourage discontinuation           of antibiotics    ----------------------------     -----------------------------         PCT >= 0.50 ng/mL              PCT 0.26 - 0.50 ng/mL                AND       <80% decrease in PCT             Encourage initiation of                                             antibiotics       Encourage continuation           of antibiotics    ----------------------------     -----------------------------        PCT >= 0.50 ng/mL                  PCT > 0.50 ng/mL               AND         increase in  PCT                  Strongly encourage                                      initiation of antibiotics    Strongly encourage escalation           of antibiotics                                     -----------------------------                                           PCT <= 0.25 ng/mL                                                 OR                                        >  80% decrease in PCT                                      Discontinue / Do not initiate                                             antibiotics  Performed at [facility], [address] [place], [place] [id number]   CBG monitoring, ED     Status: Abnormal   Collection Time: 11/04/22  8:32 AM  Result Value Ref Range   Glucose-Capillary 270 (H) 70 - 99 mg/dL    Comment: Glucose reference range applies only to samples taken after fasting for at least 8 hours.    DG Chest 2 View  Result Date: 11/03/2022 CLINICAL DATA:  Shortness of breath EXAM: CHEST - 2 VIEW COMPARISON:  10/17/2021, 07/05/2020 FINDINGS: No consolidation or pleural effusion. Diffuse bronchitic changes. Mild reticulonodular opacities bilaterally. Borderline cardiomegaly. No pneumothorax IMPRESSION: 1. Diffuse bronchitic changes with mild reticulonodular opacities, possible atypical infection. No consolidative airspace disease. 2. Borderline cardiomegaly without overt edema. Electronically Signed   By: Jasmine PangKim  Fujinaga M.D.   On: 11/03/2022 23:59    ROS: cough, not feeling right , no pain Blood pressure (!) 128/53, pulse 83, temperature 98 F (36.7 C), temperature source Oral, resp. rate (!) 21, height 5\' 7"  (1.702 m), weight 127 kg, last menstrual period 08/03/2008, SpO2 94 %. General appearance: alert, no distress, and obese, blind Resp: bronchophony bibasilar and bilaterally and diminished breath sounds bibasilar and bilaterally Cardio: regular rate and rhythm, S1, S2 normal, no murmur, click, rub or gallop GI: soft, non-tender; bowel sounds normal; no  masses,  no organomegaly and obese Extremities: extremities normal, atraumatic, no cyanosis or edema Left upper AVF-  positive thrill and bruit   Assessment/Plan: 57 year old WF multiple medical issues including ESRD-  now with hypoxia 1 hypoxia-  may have infectious component but I think also is volume overloaded and EDW needs to be lowered-  will challenge UF with HD  2 ESRD: normally [facility] via [facility]F-  she reports a history of small gains and losing weight-  needs EDW lower 3 Hypertension/vol : no BP meds-  BP is not high-  challenge with UF as able  4. Anemia of ESRD: hgb in the 8's -  not helping-  give ESA and checking iron stores 5. Metabolic Bone Disease: cont calcitriol, sensipar -  not sure what binder she is on    News CorporationKellie A Jeanita [name] 11/04/2022, 9:25 AM

## 2022-11-04 NOTE — Progress Notes (Signed)
  HEMODIALYSIS TREATMENT NOTE:   3.75 hour low-heparin HD session completed using left upper arm AVF (15g/antegrade).  She was put on a bedpan 5 times and had small liquid stools each time.  Sample was sent to lab - quick Cdiff neg, PCR panel pending.  Meds given:  Hectorol, Loperamide, Zofran, Darbepoetin.  Goal met: 3.1 liters removed without interruption in UF.  All blood was returned.  Weaned off O2 and maintaining saturations 90-93%.  POST-DIALYSIS:  11/04/22 1745  Vital Signs  Temp 97.9 F (36.6 C)  Temp Source Axillary  Pulse Rate 65  Pulse Rate Source Monitor  Resp 16  BP (!) 125/56  BP Location Right Arm  BP Method Automatic  Patient Position (if appropriate) Sitting  Oxygen Therapy  SpO2 93 %  O2 Device Room Air  Dialysis Weight  Weight 124.2 kg  Type of Weight Post-Dialysis  Post Treatment  Dialyzer Clearance Lightly streaked  Duration of HD Treatment -hour(s) 3.74 hour(s)  Hemodialysis Intake (mL) 0 mL  Liters Processed 75.1  Fluid Removed (mL) 3100 mL  Tolerated HD Treatment Yes  Post-Hemodialysis Comments Goal met  AVG/AVF Arterial Site Held (minutes) 7 minutes  AVG/AVF Venous Site Held (minutes) 7 minutes  Fistula / Graft Left Upper arm Arteriovenous fistula  Placement Date/Time: 04/26/19 1056   Orientation: Left  Access Location: Upper arm  Access Type: (c) Arteriovenous fistula  Fistula / Graft Assessment Thrill;Bruit  Status Patent  Education / Care Plan  Dialysis Education Provided Yes  Documented Education in Care Plan Yes  Outpatient Plan of Care Reviewed and on Chart Yes    Pt was transported back to [id number] as bed assignment is still pending. Hand-off given to Dalia Heading, RN    Arman Filter, RN [facility]

## 2022-11-04 NOTE — H&P (Addendum)
History and Physical    Patient: Martha [name] YNW:[id number] DOB: January 08, 1966 DOA: 11/03/2022 DOS: the patient was seen and examined on 11/04/2022 PCP: Carmel Sacramento, NP  Patient coming from: Home  Chief Complaint: Shortness of breath and cough  HPI: MEMORY HEINRICHS is a 57 y.o. 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  Patient reports 1 week of upper respiratory symptoms that began as a cough and was later associated with nausea and profuse diarrhea occasionally with blood.  Patient depends on reports of others regarding appearance of sputum and or stool since she is legally blind.  She initially presented to urgent care yesterday 4/8 with the symptoms.  She was given a DuoNeb nebulizer short-term oxygen given her presenting O2 sats were 85% on room air.  Unfortunately despite all of these treatments O2 sats decreased to 82%.  She was eventually transferred ported to the [facility] emergency department for further evaluation and treatment.  Since presenting to the ED she was found to have a Tmax of 99.3, white count was slightly low at 3500.  She has remained stable on 2 L nasal cannula oxygen.  She continued to have diarrhea and coughing.  Two-view chest x-ray revealed diffuse bronchitic changes with mild reticulonodular opacities that could represent atypical infection but no consolidative airspace disease.  No overt edema.  Her BNP was 398 and troponin flat at 103 and 93.  She has not had any chest pain.  COVID PCR was negative hospitalist team was asked to evaluate the patient for admission secondary to upper respiratory symptoms causing hypoxemia.  In addition to the above patient is primarily reporting significant coughing that is keeping her awake at night.  She has noticed over the past 2 days decreased urinary output.  She  has felt hot for several nights but has not been able to check her temperature at home.   Review of Systems: As mentioned in the history of present illness. All other systems reviewed and are negative. Past Medical History:  Diagnosis Date   Acid reflux    takes Tums   Anemia    Arthritis    Bipolar 1 disorder    Blindness of left eye    left eye removed   Carpal tunnel syndrome, bilateral    Cervical radiculopathy    CHF (congestive heart failure)    Chronic kidney disease    Stage 5- 01/25/17   Constipation    COPD (chronic obstructive pulmonary disease) 2014   bronchitis   Degenerative disc disease, thoracic    Depression    Diabetes mellitus    Type II   Diabetic retinopathy    Dyspnea    when walking   End stage kidney disease    M, W, F [facility]   Gout    Hypertension    Non-alcoholic cirrhosis    Noncompliance with medication regimen    Noncompliance with medication regimen    Obesity (BMI 30-39.9)    OSA (obstructive sleep apnea)    cpap   Panic attack    RLS (restless legs syndrome)    Past Surgical History:  Procedure Laterality Date   AORTIC ARCH ANGIOGRAPHY N/A 09/11/2020   Procedure: AORTIC ARCH ANGIOGRAPHY;  Surgeon: Nada Libman, MD;  Location: [facility];  Service: Cardiovascular;  Laterality: N/A;   AV FISTULA  PLACEMENT Right 01/27/2017   Procedure: ARTERIOVENOUS (AV) FISTULA CREATION-RIGHT ARM;  Surgeon: Sherren Kerns, MD;  Location: [facility] OR;  Service: Vascular;  Laterality: Right;   AV FISTULA PLACEMENT Left 08/03/2018   Procedure: LEFT ARTERIOVENOUS (AV) FISTULA CREATION;  Surgeon: Sherren Kerns, MD;  Location: [facility] OR;  Service: Vascular;  Laterality: Left;   BASCILIC VEIN TRANSPOSITION Left 04/26/2019   Procedure: SECOND STAGE BASILIC VEIN TRANSPOSITION LEFT ARM;  Surgeon: Sherren Kerns, MD;  Location: [facility] OR;  Service: Vascular;  Laterality: Left;   BIOPSY N/A 05/17/2013   Procedure: BIOPSY;  Surgeon: West Bali, MD;   Location: [facility] ORS;  Service: Endoscopy;  Laterality: N/A;   CARPAL TUNNEL RELEASE Left 11/25/2018   Procedure: LEFT CARPAL TUNNEL RELEASE;  Surgeon: Dairl Ponder, MD;  Location: [facility] OR;  Service: Orthopedics;  Laterality: Left;   CATARACT EXTRACTION Right    CHOLECYSTECTOMY N/A 04/27/2014   Procedure: LAPAROSCOPIC CHOLECYSTECTOMY;  Surgeon: Marlane Hatcher, MD;  Location: [facility] ORS;  Service: General;  Laterality: N/A;   COLONOSCOPY  06/06/2010   WUJ:WJXBJY bleeding secondary to internal hemorrhoids but incomplete evaluation secondary to poor right colon prep/small rectal and sigmoid colon polyps (hyperplastic). PROPOFOL   COLONOSCOPY  May 2013   Dr. Gilliam/[facility]: 5 mm a descending colon polyp, hyperplastic. Adequate bowel prep.   ENDOMETRIAL ABLATION     ENUCLEATION Left 02/04/2018   ENUCLEATION WITH PLACEMENT OF IMPLANT LEFT EYE   ENUCLEATION Left 02/04/2018   Procedure: ENUCLEATION WITH PLACEMENT OF IMPLANT LEFT EYE;  Surgeon: Floydene Flock, MD;  Location: [facility] OR;  Service: Ophthalmology;  Laterality: Left;   ESOPHAGOGASTRODUODENOSCOPY  06/06/2010   NWG:NFAOZHY erythema and edema of body of stomach, with sessile polypoid lesions. bx benign. no h.pylori   ESOPHAGOGASTRODUODENOSCOPY (EGD) WITH PROPOFOL N/A 05/17/2013   Dr. Darrick Penna: normal esophagus, moderate nodular gastritis, negative path, empiric Savary dilation   EYE SURGERY  07/2017   sx for glaucoma   FLEXIBLE SIGMOIDOSCOPY N/A 05/17/2013   Dr. Darrick Penna: moderate sized internal hemorrhoids   HEMORRHOID BANDING N/A 05/17/2013   Procedure: HEMORRHOID BANDING;  Surgeon: West Bali, MD;  Location: [facility]S;  Service: Endoscopy;  Laterality: N/A;  2 bands placed   INCISION AND DRAINAGE ABSCESS Left 10/11/2013   Procedure: INCISION AND DRAINAGE AND DEBRIDEMENT LEFT BREAST  ABSCESS;  Surgeon: Marlane Hatcher, MD;  Location: [facility]S;  Service: General;  Laterality: Left;   INSERTION OF DIALYSIS CATHETER N/A 06/08/2018   Procedure:  INSERTION OF Right internal jugular TUNNELED  DIALYSIS CATHETER;  Surgeon: Chuck Hint, MD;  Location: [facility] OR;  Service: Vascular;  Laterality: N/A;   IRRIGATION AND DEBRIDEMENT ABSCESS Right 06/01/2013   Procedure: INCISION AND DRAINAGE AND DEBRIDEMENT ABSCESS RIGHT BREAST;  Surgeon: Marlane Hatcher, MD;  Location: [facility];  Service: General;  Laterality: Right;   LEFT EYE REMOVED Left 01/2018   [facility] [address].   LIGATION OF ARTERIOVENOUS  FISTULA Right 06/08/2018   Procedure: LIGATION OF ARTERIOVENOUS  FISTULA RIGHT ARM;  Surgeon: Chuck Hint, MD;  Location: [facility] OR;  Service: Vascular;  Laterality: Right;   ORIF ANKLE FRACTURE Right 12/17/2018   Procedure: OPEN REDUCTION INTERNAL FIXATION (ORIF) ANKLE FRACTURE;  Surgeon: Felecia Shelling, DPM;  Location: [facility] OR;  Service: Podiatry;  Laterality: Right;   REVISON OF ARTERIOVENOUS FISTULA Left 09/26/2020   Procedure: LEFT ARTERIOVENOUS FISTULA  DISTAL REVASCULARIZATION AND LIGATION ( DRIL ) USING GREATER SAPHENOUS VEIN;  Surgeon: Maeola Harman,  MD;  Location: [facility] OR;  Service: Vascular;  Laterality: Left;   SAVORY DILATION N/A 05/17/2013   Procedure: SAVORY DILATION;  Surgeon: West BaliSandi L Fields, MD;  Location: [facility];  Service: Endoscopy;  Laterality: N/A;  14/15/16   SKIN FULL THICKNESS GRAFT Left 05/05/2018   Procedure: ABDOMINAL DERMIS FAT SKIN GRAFT FULL THICKNESS LEFT EYE;  Surgeon: Floydene FlockAbugo, Usiwoma Ene, MD;  Location: [facility] OR;  Service: Ophthalmology;  Laterality: Left;   TUBAL LIGATION     VEIN HARVEST Left 09/26/2020   Procedure: LEFT LEG GREATER SAPHENOUS VEIN HARVEST;  Surgeon: Maeola Harmanain, Brandon Christopher, MD;  [facility] OR;  Service: Vascular;  Laterality: Left;   Social History:  reports that she quit smoking about 15 years ago. Her smoking use included cigarettes. She has a 37.50 pack-year smoking history. She has never used smokeless tobacco. She reports that she does not drink alcohol and  does not use drugs.  Allergies  Allergen Reactions   Codeine Nausea And Vomiting   Tape Rash and Other (See Comments)    Pull skin off.  Paper tape is ok    Family History  Adopted: Yes  Family history unknown: Yes    Prior to Admission medications   Medication Sig Start Date End Date Taking? Authorizing Provider  albuterol (PROVENTIL) (2.5 MG/3ML) 0.083% nebulizer solution Take 3 mLs (2.5 mg total) by nebulization every 12 (twelve) hours as needed for wheezing or shortness of breath. 08/05/22   Valentino NoseMartinez, Jessica A, NP  ALPRAZolam Prudy Feeler(XANAX) 1 MG tablet Take 1 tablet (1 mg total) by mouth 4 (four) times daily as needed for anxiety. Patient taking differently: Take 1 mg by mouth 4 (four) times daily. 07/12/20   Rhetta MuraSamtani, Jai-Gurmukh, MD  Artificial Tear Ointment (DRY EYES OP) Place 1 drop into the right eye 2 (two) times daily.    [provider]  Blood Glucose Monitoring Suppl (FORA V30A BLOOD GLUCOSE SYSTEM) DEVI  09/22/20   [provider]  busPIRone (BUSPAR) 7.5 MG tablet Take 1 tablet (7.5 mg total) by mouth 2 (two) times daily. 07/12/20   Rhetta MuraSamtani, Jai-Gurmukh, MD  calcium carbonate (TUMS EX) 750 MG chewable tablet Chew 1-3 tablets by mouth See admin instructions. Take 3 tablets with each meal & take 1 tablets with each snack    [provider]  cinacalcet (SENSIPAR) 60 MG tablet Take 60 mg by mouth daily.    [provider]  diphenoxylate-atropine (LOMOTIL) 2.5-0.025 MG tablet Take 1 tablet by mouth 4 (four) times daily. 10/17/20   [provider]  dorzolamide-timolol (COSOPT) 22.3-6.8 MG/ML ophthalmic solution Place 1 drop into the right eye in the morning and at bedtime. 06/01/20   [provider]  doxercalciferol (HECTOROL) 4 MCG/2ML injection Inject 0.5 mLs (1 mcg total) into the vein every Monday, Wednesday, and Friday with hemodialysis. 12/03/18   Kari BaarsHawkins, Edward, MD  FORACARE PREMIUM V10 TEST test strip  09/22/20   [provider]  gabapentin (NEURONTIN) 100 MG capsule Take 1 capsule (100 mg total) by mouth at bedtime. Patient taking differently: Take 300 mg by mouth at bedtime. 07/12/20   Rhetta MuraSamtani, Jai-Gurmukh, MD  guaiFENesin (MUCINEX) 600 MG 12 hr tablet Take 1 tablet (600 mg total) by mouth 2 (two) times daily as needed for cough or to loosen phlegm. 08/05/22   Valentino NoseMartinez, Jessica A, NP  HYDROcodone-acetaminophen (NORCO) 10-325 MG tablet Take 1 tablet by mouth 4 (four) times daily. 08/30/20   [provider]  insulin aspart (NOVOLOG) 100 UNIT/ML injection  Inject 0-9 Units into the skin 3 (three) times daily with meals. Patient taking differently: Inject 4-7 Units into the skin 3 (three) times daily with meals. Sliding scale insulin 12/02/18   Kari Baars, MD  Insulin Glargine [facility]) 100 UNIT/ML Inject 30 Units into the skin at bedtime. 07/12/20   Rhetta Mura, MD  latanoprost (XALATAN) 0.005 % ophthalmic solution Place 1 drop into the right eye 2 (two) times daily. 08/22/20   [provider]  lidocaine-prilocaine (EMLA) cream Apply 1 application topically as needed (topical anesthesia for hemodialysis if Gebauers and Lidocaine injection are ineffective.). Patient taking differently: Apply 1 application  topically daily as needed (dialysis.). 04/25/17   Johnson, Clanford L, MD  omeprazole (PRILOSEC) 40 MG capsule Take 40 mg by mouth daily.    [provider]  PARoxetine (PAXIL) 20 MG tablet Take 1 tablet (20 mg total) by mouth every evening. This is to prevent panic attacks Patient taking differently: Take 20 mg by mouth at bedtime. 07/12/20   Rhetta Mura, MD  prednisoLONE acetate (PRED FORTE) 1 % ophthalmic suspension Place 1 drop into the right eye 2 (two) times daily. 08/27/20   [provider]  triamcinolone cream (KENALOG) 0.1 % Apply 1 Application topically 2 (two) times daily. 06/23/22   Particia Nearing, PA-C    Physical Exam: Vitals:   11/04/22  0300 11/04/22 0330 11/04/22 0410 11/04/22 0545  BP:  (!) 128/53    Pulse:  71 91 83  Resp:  17 (!) 23 (!) 21  Temp:      TempSrc:      SpO2: 100% 90% 97% 94%  Weight:      Height:       Constitutional: NAD, calm, comfortable Eyes: Keeps eyes closed during exam.  Does have left periorbital skin redness which patient attributes to constantly rubbing the secondary to drainage which is chronic.  She has had her left eye removed. ENMT: Mucous membranes are dry posterior pharynx clear of any exudate or lesions.Normal dentition.  Neck: normal, supple, no masses, no thyromegaly Respiratory: Coarse to auscultation with diffuse crackles throughout all lung fields although more notable right mid and lower lung fields.  2 L oxygen.  Slight increased work of breathing but able to maintain appropriate conversational speech without shortness of breath. Cardiovascular: Regular rate and rhythm, no murmurs / rubs / gallops. No extremity edema. 2+ pedal pulses.  Abdomen: no tenderness, no masses palpated.  Due to abdominal obesity unable to appreciate if any hepatosplenomegaly. Bowel sounds positive.  Musculoskeletal: no clubbing / cyanosis. No joint deformity upper and lower extremities. Good ROM, no contractures. Normal muscle tone.  Skin: no rashes, lesions, ulcers. No induration Neurologic: CN 2-12 grossly intact. Sensation intact, DTR normal. Strength 5/5 x all 4 extremities.  Psychiatric: Normal judgment and insight. Alert and oriented x 3. Normal mood.    Data Reviewed:  Sodium 134, potassium 3.0, CO2 24, glucose 132, BUN 39, creatinine 9.79, BNP as above  Procalcitonin 1.83, WBCs 3500 without a left shift, hemoglobin 8.2, platelets 116,000   COVID PCR negative  Diagnostic imaging as per HPI  Assessment and Plan: Acute hypoxemic respiratory failure likely related to COPD exacerbation with possible superimposed atypical pneumonia Pneumonia likely viral in etiology therefore we will check a  respiratory viral panel Provide supportive care with oxygen, scheduled DuoNebs every 6 hours, IV Solu-Medrol and budesonide nebs twice daily Procalcitonin elevated at 1.83 but unsure the significance of this and the patient on dialysis.  Patient  did have low-grade fever as well as borderline neutropenia Follow-up on CRP as well.  She had urinary strep.  Utilize empiric IV Zithromax to treat any possible atypical bacterial pneumonia Wean and discontinue oxygen as soon as patient can tolerate  Abnormal EKG Patient denies chest pain but routine EKG did reveal some subtle ST segment downsloping that is nonspecific in the inferior lateral leads Given hypoxia of unknown duartion PTA suspect demand ichemia Night physician did discuss with on-call cardiologist who stated patient was safe to admit to [facility] but recommended cardiology consult today Troponins are slightly elevated but have a flat trend and is may have no significance in the context of her chronic kidney disease Repeat EKG and check ECHO (h/o HFpEF)  Diarrhea Likely viral in etiology Check a gastrointestinal panel PCR Possible blood in stool that patient thinks is from irritation from repeated wiping Continue to monitor for true rectal bleeding No recent antibiotics but does attend outpatient dialysis and this could put her at risk for C. difficile therefore we will also check PCR  Chronic 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 Kathrene Bongo with nephrology made aware of patient need for dialysis today although she is otherwise stable with hypokalemia and no overt signs of volume overload on clinical exam Bone mineral replacement per nephrology team Electrolyte replacement can be accomplished during hemodialysis treatments  Diabetes mellitus 2 on insulin Follow CBGs AC/at bedtime Initially began with extra sensitive sliding scale replacement insulin since she is on dialysis and can adjust based on glycemic  response to steroids Check hemoglobin A1c Continue home insulin glargine and adjust dose as needed  Persistent thrombocytopenia Unclear significance Will cautiously utilize subcutaneous heparin for DVT prophylaxis but may need to discontinue if platelets continue to drop Baseline platelets in the past 12 months has been anywhere from 126,000 148,000 although have been steadily decreasing Hemoglobin is stable LFTs are stable so do not suspect at this juncture low platelets are related to her Elita Boone cirrhosis  Dyslipidemia Does not appear to be on pharmacotherapy  Hypertension Condition follow-up with hemodialysis  NASH cirrhosis Current LFTs are stable  Sleep apnea on CPAP Unclear if patient is utilizing verbal order at at bedtime  Bipolar disorder  Continue home medications: as needed Xanax, Paxil and BuSpar  Obesity with BMI 44 Weight reduction strategies per PCP  Patient reports has lost more than 12 pounds last few months       Advance Care Planning:   Code Status: Full Code   DVT prophylaxis subcutaneous heparin  Consults: Nephrology, cardiology  Family Communication: Patient only  Severity of Illness: The appropriate patient status for this patient is INPATIENT. Inpatient status is judged to be reasonable and necessary in order to provide the required intensity of service to ensure the patient's safety. The patient's presenting symptoms, physical exam findings, and initial radiographic and laboratory data in the context of their chronic comorbidities is felt to place them at high risk for further clinical deterioration. Furthermore, it is not anticipated that the patient will be medically stable for discharge from the hospital within 2 midnights of admission.   * I certify that at the point of admission it is my clinical judgment that the patient will require inpatient hospital care spanning beyond 2 midnights from the point of admission due to high intensity of  service, high risk for further deterioration and high frequency of surveillance required.*  Author: Junious Silk, NP 11/04/2022 6:45 AM  For on call  review [email or web address].

## 2022-11-04 NOTE — Progress Notes (Signed)
Pt has home CPAP unit setup and ready for use

## 2022-11-04 NOTE — ED Notes (Signed)
Debarah Crape daughter- [phone]. Please call with updates.

## 2022-11-04 NOTE — Consult Note (Addendum)
Cardiology Consultation   Patient ID: JERSEI GUBBELS MRN: [id number]; DOB: 1965/10/25  Admit date: 11/03/2022 Date of Consult: 11/04/2022  PCP:  Carmel Sacramento, NP   [facility] Providers Cardiologist:  Previously evaluated by Dr. Eden Emms in 2014 while admitted --> No outpatient F/U  Patient Profile:   YUNI SCHWASS is a 57 y.o. female with a hx of HFpEF, HTN, Type 2 DM, COPD, OSA, ESRD, blindness, NASH cirrhosis, GERD and Bipolar Disorder who is being seen 11/04/2022 for the evaluation of abnormal EKG and elevated troponin values at the request of Dr. Thomes Dinning.  History of Present Illness:   Ms. Buschmann presented to [facility] on 11/03/2022 for evaluation of worsening dyspnea over the past week with associated cough and nasal congestion. She was tachypneic and hypoxic upon arrival with saturations at 85% on room air but this quickly improved with application of 2 L nasal cannula. She was transferred to [facility] ED for further evaluation.  Temperature was elevated 99.3 upon arrival. Initial labs showed WBC 3.5, Hgb 8.2, platelets 116, Na+ 134, K+ 3.0 and creatinine 9.79. BNP elevated to 398. Initial and repeat Hs Troponin values elevated at 103 and 93. Pro calcitonin 1.83. Negative for COVID. Respiratory Panel pending. CXR showing diffuse bronchitic changes with mild reticulonodular opacities and no consolidative airspace disease. Noted to have borderline cardiomegaly without overt edema. EKG showed NSR, HR 75 with ST depression along the lateral leads which is more prominent when compared to prior tracings from 2023.  She was admitted for further management of acute hypoxic respiratory failure in the setting of a COPD exacerbation and possible atypical PNA. Has been started on Azithromycin. Nephrology is following for dialysis and feels like volume overload may be contributing to her hypoxia. They are planning to lower her dry weight.  In talking with the patient today, she  reports having a productive cough and sinus congestion for the past 2 weeks. Unsure of the color of her sputum given that she is legally blind. Also reports dyspnea on exertion.  No specific orthopnea or PND.  Says she does use her CPAP at night.  Feels like she has been retaining fluid in her legs. She does report having episodes of chest discomfort which has occurred in the setting of coughing.  Reports her pain can last for hours at a time.  Occurs at rest or with activity.   Past Medical History:  Diagnosis Date   Acid reflux    takes Tums   Anemia    Arthritis    Bipolar 1 disorder    Blindness of left eye    left eye removed   Carpal tunnel syndrome, bilateral    Cervical radiculopathy    CHF (congestive heart failure)    Chronic kidney disease    Stage 5- 01/25/17   Constipation    COPD (chronic obstructive pulmonary disease) 2014   bronchitis   Degenerative disc disease, thoracic    Depression    Diabetes mellitus    Type II   Diabetic retinopathy    Dyspnea    when walking   End stage kidney disease    M, W, F [facility]   Gout    Hypertension    Non-alcoholic cirrhosis    Noncompliance with medication regimen    Noncompliance with medication regimen    Obesity (BMI 30-39.9)    OSA (obstructive sleep apnea)    cpap   Panic attack    RLS (  restless legs syndrome)     Past Surgical History:  Procedure Laterality Date   AORTIC ARCH ANGIOGRAPHY N/A 09/11/2020   Procedure: AORTIC ARCH ANGIOGRAPHY;  Surgeon: Nada Libman, MD;  Location: [facility];  Service: Cardiovascular;  Laterality: N/A;   AV FISTULA PLACEMENT Right 01/27/2017   Procedure: ARTERIOVENOUS (AV) FISTULA CREATION-RIGHT ARM;  Surgeon: Sherren Kerns, MD;  Location: [facility] OR;  Service: Vascular;  Laterality: Right;   AV FISTULA PLACEMENT Left 08/03/2018   Procedure: LEFT ARTERIOVENOUS (AV) FISTULA CREATION;  Surgeon: Sherren Kerns, MD;  Location: [facility] OR;  Service: Vascular;  Laterality:  Left;   BASCILIC VEIN TRANSPOSITION Left 04/26/2019   Procedure: SECOND STAGE BASILIC VEIN TRANSPOSITION LEFT ARM;  Surgeon: Sherren Kerns, MD;  Location: [facility] OR;  Service: Vascular;  Laterality: Left;   BIOPSY N/A 05/17/2013   Procedure: BIOPSY;  Surgeon: West Bali, MD;  Location: [facility] ORS;  Service: Endoscopy;  Laterality: N/A;   CARPAL TUNNEL RELEASE Left 11/25/2018   Procedure: LEFT CARPAL TUNNEL RELEASE;  Surgeon: Dairl Ponder, MD;  Location: [facility] OR;  Service: Orthopedics;  Laterality: Left;   CATARACT EXTRACTION Right    CHOLECYSTECTOMY N/A 04/27/2014   Procedure: LAPAROSCOPIC CHOLECYSTECTOMY;  Surgeon: Marlane Hatcher, MD;  Location: [facility] ORS;  Service: General;  Laterality: N/A;   COLONOSCOPY  06/06/2010   ZOX:WRUEAV bleeding secondary to internal hemorrhoids but incomplete evaluation secondary to poor right colon prep/small rectal and sigmoid colon polyps (hyperplastic). PROPOFOL   COLONOSCOPY  May 2013   Dr. Gilliam/[facility]: 5 mm a descending colon polyp, hyperplastic. Adequate bowel prep.   ENDOMETRIAL ABLATION     ENUCLEATION Left 02/04/2018   ENUCLEATION WITH PLACEMENT OF IMPLANT LEFT EYE   ENUCLEATION Left 02/04/2018   Procedure: ENUCLEATION WITH PLACEMENT OF IMPLANT LEFT EYE;  Surgeon: Floydene Flock, MD;  Location: [facility] OR;  Service: Ophthalmology;  Laterality: Left;   ESOPHAGOGASTRODUODENOSCOPY  06/06/2010   WUJ:WJXBJYN erythema and edema of body of stomach, with sessile polypoid lesions. bx benign. no h.pylori   ESOPHAGOGASTRODUODENOSCOPY (EGD) WITH PROPOFOL N/A 05/17/2013   Dr. Darrick Penna: normal esophagus, moderate nodular gastritis, negative path, empiric Savary dilation   EYE SURGERY  07/2017   sx for glaucoma   FLEXIBLE SIGMOIDOSCOPY N/A 05/17/2013   Dr. Darrick Penna: moderate sized internal hemorrhoids   HEMORRHOID BANDING N/A 05/17/2013   Procedure: HEMORRHOID BANDING;  Surgeon: West Bali, MD;  Location: [facility];  Service: Endoscopy;  Laterality: N/A;  2 bands  placed   INCISION AND DRAINAGE ABSCESS Left 10/11/2013   Procedure: INCISION AND DRAINAGE AND DEBRIDEMENT LEFT BREAST  ABSCESS;  Surgeon: Marlane Hatcher, MD;  Location: [facility];  Service: General;  Laterality: Left;   INSERTION OF DIALYSIS CATHETER N/A 06/08/2018   Procedure: INSERTION OF Right internal jugular TUNNELED  DIALYSIS CATHETER;  Surgeon: Chuck Hint, MD;  Location: [facility] OR;  Service: Vascular;  Laterality: N/A;   IRRIGATION AND DEBRIDEMENT ABSCESS Right 06/01/2013   Procedure: INCISION AND DRAINAGE AND DEBRIDEMENT ABSCESS RIGHT BREAST;  Surgeon: Marlane Hatcher, MD;  Location: [facility];  Service: General;  Laterality: Right;   LEFT EYE REMOVED Left 01/2018   [facility] [address].   LIGATION OF ARTERIOVENOUS  FISTULA Right 06/08/2018   Procedure: LIGATION OF ARTERIOVENOUS  FISTULA RIGHT ARM;  Surgeon: Chuck Hint, MD;  Location: [facility] OR;  Service: Vascular;  Laterality: Right;   ORIF ANKLE FRACTURE Right 12/17/2018   Procedure: OPEN REDUCTION INTERNAL FIXATION (ORIF)  ANKLE FRACTURE;  Surgeon: Felecia Shelling, DPM;  Location: [facility] OR;  Service: Podiatry;  Laterality: Right;   REVISON OF ARTERIOVENOUS FISTULA Left 09/26/2020   Procedure: LEFT ARTERIOVENOUS FISTULA  DISTAL REVASCULARIZATION AND LIGATION ( DRIL ) USING GREATER SAPHENOUS VEIN;  Surgeon: Maeola Harman, MD;  Location: [facility] OR;  Service: Vascular;  Laterality: Left;   SAVORY DILATION N/A 05/17/2013   Procedure: SAVORY DILATION;  Surgeon: West Bali, MD;  Location: [facility];  Service: Endoscopy;  Laterality: N/A;  14/15/16   SKIN FULL THICKNESS GRAFT Left 05/05/2018   Procedure: ABDOMINAL DERMIS FAT SKIN GRAFT FULL THICKNESS LEFT EYE;  Surgeon: Floydene Flock, MD;  Location: [facility] OR;  Service: Ophthalmology;  Laterality: Left;   TUBAL LIGATION     VEIN HARVEST Left 09/26/2020   Procedure: LEFT LEG GREATER SAPHENOUS VEIN HARVEST;  Surgeon: Maeola Harman, MD;  Location: [facility]  OR;  Service: Vascular;  Laterality: Left;     Home Medications:  Prior to Admission medications   Medication Sig Start Date End Date Taking? Authorizing Provider  albuterol (PROVENTIL) (2.5 MG/3ML) 0.083% nebulizer solution Take 3 mLs (2.5 mg total) by nebulization every 12 (twelve) hours as needed for wheezing or shortness of breath. 08/05/22   Valentino Nose, NP  ALPRAZolam Prudy Feeler) 1 MG tablet Take 1 tablet (1 mg total) by mouth 4 (four) times daily as needed for anxiety. Patient taking differently: Take 1 mg by mouth 4 (four) times daily. 07/12/20   Rhetta Mura, MD  Artificial Tear Ointment (DRY EYES OP) Place 1 drop into the right eye 2 (two) times daily.    [provider]  Blood Glucose Monitoring Suppl (FORA V30A BLOOD GLUCOSE SYSTEM) DEVI  09/22/20   [provider]  busPIRone (BUSPAR) 7.5 MG tablet Take 1 tablet (7.5 mg total) by mouth 2 (two) times daily. 07/12/20   Rhetta Mura, MD  calcium carbonate (TUMS EX) 750 MG chewable tablet Chew 1-3 tablets by mouth See admin instructions. Take 3 tablets with each meal & take 1 tablets with each snack    [provider]  cinacalcet (SENSIPAR) 60 MG tablet Take 60 mg by mouth daily.    [provider]  diphenoxylate-atropine (LOMOTIL) 2.5-0.025 MG tablet Take 1 tablet by mouth 4 (four) times daily. 10/17/20   [provider]  dorzolamide-timolol (COSOPT) 22.3-6.8 MG/ML ophthalmic solution Place 1 drop into the right eye in the morning and at bedtime. 06/01/20   [provider]  doxercalciferol (HECTOROL) 4 MCG/2ML injection Inject 0.5 mLs (1 mcg total) into the vein every Monday, Wednesday, and Friday with hemodialysis. 12/03/18   Kari Baars, MD  FORACARE PREMIUM V10 TEST test strip  09/22/20   [provider]  gabapentin (NEURONTIN) 100 MG capsule Take 1 capsule (100 mg total) by mouth at bedtime. Patient taking differently: Take 300 mg by mouth at bedtime.  07/12/20   Rhetta Mura, MD  guaiFENesin (MUCINEX) 600 MG 12 hr tablet Take 1 tablet (600 mg total) by mouth 2 (two) times daily as needed for cough or to loosen phlegm. 08/05/22   Valentino Nose, NP  HYDROcodone-acetaminophen (NORCO) 10-325 MG tablet Take 1 tablet by mouth 4 (four) times daily. 08/30/20   [provider]  insulin aspart (NOVOLOG) 100 UNIT/ML injection Inject 0-9 Units into the skin 3 (three) times daily with meals. Patient taking differently: Inject 4-7 Units into the skin 3 (three) times daily with meals. Sliding scale insulin 12/02/18   Kari Baars,  MD  Insulin Glargine (BASAGLAR KWIKPEN) 100 UNIT/ML Inject 30 Units into the skin at bedtime. 07/12/20   Rhetta Mura, MD  latanoprost (XALATAN) 0.005 % ophthalmic solution Place 1 drop into the right eye 2 (two) times daily. 08/22/20   [provider]  lidocaine-prilocaine (EMLA) cream Apply 1 application topically as needed (topical anesthesia for hemodialysis if Gebauers and Lidocaine injection are ineffective.). Patient taking differently: Apply 1 application  topically daily as needed (dialysis.). 04/25/17   Johnson, Clanford L, MD  omeprazole (PRILOSEC) 40 MG capsule Take 40 mg by mouth daily.    [provider]  PARoxetine (PAXIL) 20 MG tablet Take 1 tablet (20 mg total) by mouth every evening. This is to prevent panic attacks Patient taking differently: Take 20 mg by mouth at bedtime. 07/12/20   Rhetta Mura, MD  prednisoLONE acetate (PRED FORTE) 1 % ophthalmic suspension Place 1 drop into the right eye 2 (two) times daily. 08/27/20   [provider]  triamcinolone cream (KENALOG) 0.1 % Apply 1 Application topically 2 (two) times daily. 06/23/22   Particia Nearing, PA-C    Inpatient Medications: Scheduled Meds:  budesonide (PULMICORT) nebulizer solution  0.25 mg Nebulization BID   busPIRone  7.5 mg Oral BID   calcitRIOL  1.5 mcg Oral Q T,Th,Sa-HD    Chlorhexidine Gluconate Cloth  6 each Topical Q0600   [START ON 11/05/2022] cinacalcet  60 mg Oral Q breakfast   darbepoetin (ARANESP) injection - DIALYSIS  200 mcg Subcutaneous Q Tue-1800   gabapentin  100 mg Oral QHS   heparin  5,000 Units Subcutaneous Q8H   insulin aspart  0-5 Units Subcutaneous QHS   insulin aspart  0-6 Units Subcutaneous TID WC   insulin glargine-yfgn  30 Units Subcutaneous Daily   ipratropium-albuterol  3 mL Nebulization Q6H   latanoprost  1 drop Right Eye BID   methylPREDNISolone (SOLU-MEDROL) injection  40 mg Intravenous Q12H   pantoprazole  40 mg Oral Daily   PARoxetine  20 mg Oral Daily   sodium chloride flush  3 mL Intravenous Q12H   timolol  1 drop Right Eye Daily   Continuous Infusions:  azithromycin 500 mg (11/04/22 0807)   PRN Meds: acetaminophen **OR** acetaminophen, ALPRAZolam, chlorpheniramine-HYDROcodone, HYDROcodone-acetaminophen, ondansetron **OR** ondansetron (ZOFRAN) IV, polyvinyl alcohol  Allergies:    Allergies  Allergen Reactions   Codeine Nausea And Vomiting   Tape Rash and Other (See Comments)    Pull skin off.  Paper tape is ok    Social History:   Social History   Socioeconomic History   Marital status: Significant Other    Spouse name: Not on file   Number of children: 2   Years of education: Not on file   Highest education level: Not on file  Occupational History   Occupation: Disabled  Tobacco Use   Smoking status: Former    Packs/day: 1.50    Years: 25.00    Additional pack years: 0.00    Total pack years: 37.50    Types: Cigarettes    Quit date: 10/23/2007    Years since quitting: 15.0   Smokeless tobacco: Never  Vaping Use   Vaping Use: Never used  Substance and Sexual Activity   Alcohol use: No   Drug use: No   Sexual activity: Yes    Birth control/protection: Surgical  Other Topics Concern   Not on file  Social History Narrative   Lives with fiance.     Social Determinants of Health  Financial  Resource Strain: Not on file  Food Insecurity: Not on file  Transportation Needs: Not on file  Physical Activity: Not on file  Stress: Not on file  Social Connections: Not on file  Intimate Partner Violence: Not on file    Family History:    Family History  Adopted: Yes  Family history unknown: Yes     ROS:  Please see the history of present illness.   All other ROS reviewed and negative.     Physical Exam/Data:   Vitals:   11/04/22 0300 11/04/22 0330 11/04/22 0410 11/04/22 0545  BP:  (!) 128/53    Pulse:  71 91 83  Resp:  17 (!) 23 (!) 21  Temp:      TempSrc:      SpO2: 100% 90% 97% 94%  Weight:      Height:       No intake or output data in the 24 hours ending 11/04/22 1034    11/03/2022    8:21 PM 10/18/2021    5:25 PM 10/18/2021    5:15 AM  Last 3 Weights  Weight (lbs) 280 lb 322 lb 15.6 oz 322 lb 15.6 oz  Weight (kg) 127.007 kg 146.5 kg 146.5 kg     Body mass index is 43.85 kg/m.  General:  Female appearing in no acute distress.  HEENT: normal Neck: no JVD Vascular: No carotid bruits; Distal pulses 2+ bilaterally Cardiac:  normal S1, S2; RRR; no murmur Lungs: rales along bases bilaterally.  Abd: soft, nontender, no hepatomegaly  Ext: no pitting edema Musculoskeletal:  No deformities, BUE and BLE strength normal and equal Skin: warm and dry  Neuro:  CNs 2-12 intact, no focal abnormalities noted Psych:  Normal affect   EKG:  The EKG was personally reviewed and demonstrates: NSR, HR 75 with ST depression along the lateral leads which is more prominent when compared to prior tracings from 2023. Telemetry:  Telemetry was personally reviewed and demonstrates: NSR, HR in 80's to 90's.   Relevant CV Studies:  Echocardiogram: 09/2021 IMPRESSIONS     1. Left ventricular ejection fraction, by estimation, is 60 to 65%. Left  ventricular ejection fraction by PLAX is 64 %. The left ventricle has  normal function. The left ventricle has no regional wall motion   abnormalities. Left ventricular diastolic  parameters are consistent with Grade II diastolic dysfunction  (pseudonormalization). Elevated left ventricular end-diastolic pressure.  The E/e' is 35. The average left ventricular global longitudinal strain is  -21.2 %. The global longitudinal strain is  normal.   2. Right ventricular systolic function is normal. The right ventricular  size is normal.   3. The mitral valve is grossly normal. No evidence of mitral valve  regurgitation.   4. The aortic valve was not well visualized. Aortic valve regurgitation  is not visualized. No aortic stenosis is present.   Comparison(s): Changes from prior study are noted. 07/07/2020: LVEF  55-60%.   Laboratory Data:  High Sensitivity Troponin:   Recent Labs  Lab 11/04/22 0024 11/04/22 0127  TROPONINIHS 103* 93*     Chemistry Recent Labs  Lab 11/04/22 0024  NA 134*  K 3.0*  CL 94*  CO2 24  GLUCOSE 132*  BUN 39*  CREATININE 9.79*  CALCIUM 9.2  GFRNONAA 4*  ANIONGAP 16*    No results for input(s): "PROT", "ALBUMIN", "AST", "ALT", "ALKPHOS", "BILITOT" in the last 168 hours. Lipids No results for input(s): "CHOL", "TRIG", "HDL", "LABVLDL", "LDLCALC", "CHOLHDL" in the  last 168 hours.  Hematology Recent Labs  Lab 11/04/22 0024  WBC 3.5*  RBC 3.17*  HGB 8.2*  HCT 26.7*  MCV 84.2  MCH 25.9*  MCHC 30.7  RDW 19.0*  PLT 116*   Thyroid No results for input(s): "TSH", "FREET4" in the last 168 hours.  BNP Recent Labs  Lab 11/04/22 0024  BNP 398.0*    DDimer No results for input(s): "DDIMER" in the last 168 hours.   Radiology/Studies:  DG Chest 2 View  Result Date: 11/03/2022 CLINICAL DATA:  Shortness of breath EXAM: CHEST - 2 VIEW COMPARISON:  10/17/2021, 07/05/2020 FINDINGS: No consolidation or pleural effusion. Diffuse bronchitic changes. Mild reticulonodular opacities bilaterally. Borderline cardiomegaly. No pneumothorax IMPRESSION: 1. Diffuse bronchitic changes with mild  reticulonodular opacities, possible atypical infection. No consolidative airspace disease. 2. Borderline cardiomegaly without overt edema. Electronically Signed   By: Jasmine PangKim  Fujinaga M.D.   On: 11/03/2022 23:59     Assessment and Plan:   1. Atypical Chest Pain/Abnormal EKG/Elevated Troponin Values - Her recent episodes of chest pain seem most consistent with a pleuritic etiology in the setting of frequent coughing. Troponin values have been flat at 103 and 93 which is likely secondary to demand ischemia in the setting of her acute illness. This is not consistent with ACS. Symptoms seem atypical for pericarditis as well. EKG does show more prominent ST depression along the lateral leads which is new when compared to prior tracings.  - An echocardiogram is pending for assessment of any structural abnormalities. If this is reassuring, could consider outpatient ischemic evaluation with a Lexiscan Myoview once her respiratory status has improved.  2. COPD Exacerbation/Possible PNA - Admitted with acute hypoxic respiratory failure and reports of low-grade fever, coughing and sinus congestion. She has been started on empiric antibiotics and respiratory panel is pending. Negative for COVID.   3. ESRD - On HD - Tuesday, Thursday and Saturday schedule. Nephrology following.   4. OSA - Uses her CPAP at night. Will order for use during admission.    For questions or updates, please contact [facility] Please consult [email or web address] for contact info under    Signed, Ellsworth LennoxBrittany M Strader, PA-C  11/04/2022 10:34 AM  Attending note     Patient seen and discussed with PA Iran OuchStrader, I agree with her documentation. 57 yo female history of ESRD, COPD< HTN, DM2, bipolar, presented with cough,  nasal congestion, SOB.    From urgent care note hypoxic to 85% on RA on presentaiton. Tachypneic. Sent to ER for evaluation.   From admit note 1 week of URI symptoms with cough, but also nausea with profuse  diarrhea at times with blood. To me she reports some intermittent chest pains, difficult to describe the character. Pain is worst with coughing, deep breathing, and position.          WBC 3.5 Hgb 8.2 Plt 116 K 3 Cr 9.79 BUN 39 BNP 398  Pro calc 1.83 Trop 103-->93 EKG SR, ST depression I/aVL, II, lateral precordial leads CXR diffuse bronchitic changes, possible atypical infection.  COVID neg     2018 nuclear stress: no ischemia 09/2021 echo: LVE 60-65%< grade II dd   1.Elevated troponin - mild elevation trending down in setting of ESRD, pneumonia, COPD exacerbation, hypoxia. Does have some new ST depressions on EKG - atypical chest pain in that its worst with coughing, deep breathing, and position.  - echo pending   - suspect demand ischemia - f/u echo, at this time no immediate  plans for ischemic testing - no need for anticoagulation at this time.      2. COPD exacerbation/pneumonia - per primary team  3. Diarrhea - per primary team   4. Legally blind   Dina Rich MD

## 2022-11-04 NOTE — ED Provider Notes (Signed)
[place] [facility]ERGENCY DEPARTMENT AT [facility] Provider Note   CSN: [id number] Arrival date & time: 11/03/22  2009     History  No chief complaint on file.   Martha [name] is a 57 y.o. female.  57 year old female here with shortness of breath hypoxia.  Patient has felt bad for a few days.  Febrile.  Cough.  Patient has a history of end-stage renal disease is on dialysis but has not missed any treatments.  Her last time was on Saturday.  She is legally blind but does not feel like her legs or abdomen are any more swollen than normal.  She does not feel she is fluid overloaded.  No fevers.        Home Medications Prior to Admission medications   Medication Sig Start Date End Date Taking? Authorizing Provider  albuterol (PROVENTIL) (2.5 MG/3ML) 0.083% nebulizer solution Take 3 mLs (2.5 mg total) by nebulization every 12 (twelve) hours as needed for wheezing or shortness of breath. 08/05/22   Valentino Nose, NP  ALPRAZolam Prudy Feeler) 1 MG tablet Take 1 tablet (1 mg total) by mouth 4 (four) times daily as needed for anxiety. Patient taking differently: Take 1 mg by mouth 4 (four) times daily. 07/12/20   Rhetta Mura, MD  Artificial Tear Ointment (DRY EYES OP) Place 1 drop into the right eye 2 (two) times daily.    [provider]  Blood Glucose Monitoring Suppl (FORA V30A BLOOD GLUCOSE SYSTEM) DEVI  09/22/20   [provider]  busPIRone (BUSPAR) 7.5 MG tablet Take 1 tablet (7.5 mg total) by mouth 2 (two) times daily. 07/12/20   Rhetta Mura, MD  calcium carbonate (TUMS EX) 750 MG chewable tablet Chew 1-3 tablets by mouth See admin instructions. Take 3 tablets with each meal & take 1 tablets with each snack    [provider]  cinacalcet (SENSIPAR) 60 MG tablet Take 60 mg by mouth daily.    [provider]  diphenoxylate-atropine (LOMOTIL) 2.5-0.025 MG tablet Take 1 tablet by mouth 4 (four) times daily. 10/17/20   [provider]  dorzolamide-timolol (COSOPT) 22.3-6.8 MG/ML ophthalmic solution Place 1 drop into the right eye in the morning and at bedtime. 06/01/20   [provider]  doxercalciferol (HECTOROL) 4 MCG/2ML injection Inject 0.5 mLs (1 mcg total) into the vein every Monday, Wednesday, and Friday with hemodialysis. 12/03/18   Kari Baars, MD  FORACARE PREMIUM V10 TEST test strip  09/22/20   [provider]  gabapentin (NEURONTIN) 100 MG capsule Take 1 capsule (100 mg total) by mouth at bedtime. Patient taking differently: Take 300 mg by mouth at bedtime. 07/12/20   Rhetta Mura, MD  guaiFENesin (MUCINEX) 600 MG 12 hr tablet Take 1 tablet (600 mg total) by mouth 2 (two) times daily as needed for cough or to loosen phlegm. 08/05/22   Valentino Nose, NP  HYDROcodone-acetaminophen (NORCO) 10-325 MG tablet Take 1 tablet by mouth 4 (four) times daily. 08/30/20   [provider]  insulin aspart (NOVOLOG) 100 UNIT/ML injection Inject 0-9 Units into the skin 3 (three) times daily with meals. Patient taking differently: Inject 4-7 Units into the skin 3 (three) times daily with meals. Sliding scale insulin 12/02/18   Kari Baars, MD  Insulin Glargine [facility]) 100 UNIT/ML Inject 30 Units into the skin at bedtime. 07/12/20   Rhetta Mura, MD  latanoprost (XALATAN) 0.005 % ophthalmic solution Place 1 drop into the right eye 2 (two) times daily.  08/22/20   [provider]  lidocaine-prilocaine (EMLA) cream Apply 1 application topically as needed (topical anesthesia for hemodialysis if Gebauers and Lidocaine injection are ineffective.). Patient taking differently: Apply 1 application  topically daily as needed (dialysis.). 04/25/17   Johnson, Clanford L, MD  omeprazole (PRILOSEC) 40 MG capsule Take 40 mg by mouth daily.    [provider]  PARoxetine (PAXIL) 20 MG tablet Take 1 tablet (20 mg total) by mouth every evening. This is to prevent panic  attacks Patient taking differently: Take 20 mg by mouth at bedtime. 07/12/20   Rhetta Mura, MD  prednisoLONE acetate (PRED FORTE) 1 % ophthalmic suspension Place 1 drop into the right eye 2 (two) times daily. 08/27/20   [provider]  triamcinolone cream (KENALOG) 0.1 % Apply 1 Application topically 2 (two) times daily. 06/23/22   Particia Nearing, PA-C      Allergies    Codeine and Tape    Review of Systems   Review of Systems  Physical Exam Updated Vital Signs BP (!) 128/53   Pulse 83   Temp 98 F (36.7 C) (Oral)   Resp (!) 21   Ht 5\' 7"  (1.702 m)   Wt 127 kg   LMP 08/03/2008 (Exact Date)   SpO2 94%   BMI 43.85 kg/m  Physical Exam Vitals and nursing note reviewed.  Constitutional:      Appearance: She is well-developed.  HENT:     Head: Normocephalic and atraumatic.     Mouth/Throat:     Mouth: Mucous membranes are dry.  Eyes:     Pupils: Pupils are equal, round, and reactive to light.  Cardiovascular:     Rate and Rhythm: Normal rate and regular rhythm.  Pulmonary:     Effort: No respiratory distress.     Breath sounds: No stridor. Wheezing present.     Comments: Hypoxic to 85%, not on oxygen at home. Abdominal:     General: There is no distension.  Musculoskeletal:        General: No swelling.     Cervical back: Normal range of motion.  Neurological:     General: No focal deficit present.     Mental Status: She is alert.     ED Results / Procedures / Treatments   Labs (all labs ordered are listed, but only abnormal results are displayed) Labs Reviewed  CBC WITH DIFFERENTIAL/PLATELET - Abnormal; Notable for the following components:      Result Value   WBC 3.5 (*)    RBC 3.17 (*)    Hemoglobin 8.2 (*)    HCT 26.7 (*)    MCH 25.9 (*)    RDW 19.0 (*)    Platelets 116 (*)    nRBC 1 (*)    All other components within normal limits  BASIC METABOLIC PANEL - Abnormal; Notable for the following components:   Sodium 134 (*)     Potassium 3.0 (*)    Chloride 94 (*)    Glucose, Bld 132 (*)    BUN 39 (*)    Creatinine, Ser 9.79 (*)    GFR, Estimated 4 (*)    Anion gap 16 (*)    All other components within normal limits  BRAIN NATRIURETIC PEPTIDE - Abnormal; Notable for the following components:   B Natriuretic Peptide 398.0 (*)    All other components within normal limits  TROPONIN I (HIGH SENSITIVITY) - Abnormal; Notable for the following components:   Troponin I (High Sensitivity)  103 (*)    All other components within normal limits  TROPONIN I (HIGH SENSITIVITY) - Abnormal; Notable for the following components:   Troponin I (High Sensitivity) 93 (*)    All other components within normal limits  SARS CORONAVIRUS 2 BY RT PCR    EKG EKG Interpretation  Date/Time:  Monday November 03 2022 20:37:45 EDT Ventricular Rate:  75 PR Interval:  144 QRS Duration: 84 QT Interval:  412 QTC Calculation: 460 R Axis:   47 Text Interpretation: Normal sinus rhythm Nonspecific ST and T wave abnormality Prolonged QT Abnormal ECG When compared with ECG of 17-Oct-2021 18:35, PREVIOUS ECG IS PRESENT ST depression in III, avF and V5-6 new from march 2023 Confirmed by Marily MemosMesner, Izaias Krupka [phone]([id number]) on 11/04/2022 3:22:04 AM  Radiology DG Chest 2 View  Result Date: 11/03/2022 CLINICAL DATA:  Shortness of breath EXAM: CHEST - 2 VIEW COMPARISON:  10/17/2021, 07/05/2020 FINDINGS: No consolidation or pleural effusion. Diffuse bronchitic changes. Mild reticulonodular opacities bilaterally. Borderline cardiomegaly. No pneumothorax IMPRESSION: 1. Diffuse bronchitic changes with mild reticulonodular opacities, possible atypical infection. No consolidative airspace disease. 2. Borderline cardiomegaly without overt edema. Electronically Signed   By: Jasmine PangKim  Fujinaga M.D.   On: 11/03/2022 23:59    Procedures .Critical Care  Performed by: Marily MemosMesner, Nickoles Gregori, MD Authorized by: Marily MemosMesner, Meigan Pates, MD   Critical care provider statement:    Critical care time  (minutes):  30   Critical care was necessary to treat or prevent imminent or life-threatening deterioration of the following conditions:  Respiratory failure   Critical care was time spent personally by me on the following activities:  Development of treatment plan with patient or surrogate, discussions with consultants, evaluation of patient's response to treatment, examination of patient, ordering and review of laboratory studies, ordering and review of radiographic studies, ordering and performing treatments and interventions, pulse oximetry, re-evaluation of patient's condition and review of old charts     Medications Ordered in ED Medications  ipratropium-albuterol (DUONEB) 0.5-2.5 (3) MG/3ML nebulizer solution 3 mL (3 mLs Nebulization Given 11/04/22 0310)  methylPREDNISolone sodium succinate (SOLU-MEDROL) 125 mg/2 mL injection 125 mg (125 mg Intravenous Given 11/04/22 0310)  doxycycline (VIBRA-TABS) tablet 100 mg (100 mg Oral Given 11/04/22 [id number])    ED Course/ Medical Decision Making/ A&P                             Medical Decision Making Amount and/or Complexity of Data Reviewed Labs: ordered. Radiology: ordered.  Risk Prescription drug management. Decision regarding hospitalization.   Full breathing treatments and antibiotics and steroids given here still hypoxic.  Will need admission for COPD exacerbation.  X-ray without evidence of fluid low suspicion for fluid overload.  She did have some minor EKG changes and minorly elevated troponins which are flat.  Discussed these with Dr. Augustina MoodBen Simone who recommends admission here at [facility] and have cardiology consult in the morning [facility] not [facility].   Final Clinical Impression(s) / ED Diagnoses Final diagnoses:  Hypoxia  COPD exacerbation    Rx / DC Orders ED Discharge Orders     None         Estevan Kersh, Barbara CowerJason, MD 11/04/22 [phone]

## 2022-11-04 NOTE — ED Notes (Signed)
Pt returned from Dialysis. Pt cbg at 1630- 167 not crossing over at this time. Pt complains of discomfort in stretcher. Offered to reposition pt states nothing will help.

## 2022-11-04 NOTE — ED Notes (Signed)
Patient transported to Dialysis

## 2022-11-05 ENCOUNTER — Other Ambulatory Visit (HOSPITAL_COMMUNITY): Payer: Self-pay | Admitting: *Deleted

## 2022-11-05 ENCOUNTER — Inpatient Hospital Stay (HOSPITAL_COMMUNITY): Payer: Medicaid Other

## 2022-11-05 DIAGNOSIS — R9431 Abnormal electrocardiogram [ECG] [EKG]: Secondary | ICD-10-CM

## 2022-11-05 DIAGNOSIS — J9601 Acute respiratory failure with hypoxia: Secondary | ICD-10-CM | POA: Diagnosis not present

## 2022-11-05 DIAGNOSIS — R0789 Other chest pain: Secondary | ICD-10-CM | POA: Diagnosis not present

## 2022-11-05 DIAGNOSIS — N186 End stage renal disease: Secondary | ICD-10-CM | POA: Diagnosis not present

## 2022-11-05 DIAGNOSIS — R7989 Other specified abnormal findings of blood chemistry: Secondary | ICD-10-CM | POA: Diagnosis not present

## 2022-11-05 LAB — ECHOCARDIOGRAM COMPLETE
AR max vel: 1.83 cm2
AV Area VTI: 1.88 cm2
AV Area mean vel: 2.07 cm2
AV Mean grad: 7 mmHg
AV Peak grad: 19.9 mmHg
Ao pk vel: 2.23 m/s
Area-P 1/2: 3.27 cm2
Height: 67 in
S' Lateral: 3 cm
Weight: 4380.98 oz

## 2022-11-05 LAB — GASTROINTESTINAL PANEL BY PCR, STOOL (REPLACES STOOL CULTURE)

## 2022-11-05 LAB — GLUCOSE, CAPILLARY
Glucose-Capillary: 167 mg/dL — ABNORMAL HIGH (ref 70–99)
Glucose-Capillary: 184 mg/dL — ABNORMAL HIGH (ref 70–99)
Glucose-Capillary: 228 mg/dL — ABNORMAL HIGH (ref 70–99)

## 2022-11-05 LAB — IRON AND TIBC
Iron: 93 ug/dL (ref 28–170)
Saturation Ratios: 52 % — ABNORMAL HIGH (ref 10.4–31.8)
TIBC: 180 ug/dL — ABNORMAL LOW (ref 250–450)
UIBC: 87 ug/dL

## 2022-11-05 LAB — FERRITIN: Ferritin: 1557 ng/mL — ABNORMAL HIGH (ref 11–307)

## 2022-11-05 LAB — HEPATITIS B SURFACE ANTIBODY, QUANTITATIVE: Hep B S AB Quant (Post): <3.5 m[IU]/mL — ABNORMAL LOW (ref >9.9)

## 2022-11-05 MED ORDER — INSULIN ASPART 100 UNIT/ML ~~LOC~~ SOLN: 4.0000 [IU] | Freq: Three times a day (TID) | SUBCUTANEOUS | Status: DC

## 2022-11-05 MED ORDER — DEXTROMETHORPHAN POLISTIREX ER 30 MG/5ML PO SUER: 30.0000 mg | Freq: Two times a day (BID) | ORAL | 0 refills | Status: DC | PRN

## 2022-11-05 MED ORDER — DOXYCYCLINE HYCLATE 100 MG PO CAPS: 100.0000 mg | ORAL_CAPSULE | Freq: Two times a day (BID) | ORAL | 0 refills | Status: AC

## 2022-11-05 MED ORDER — GUAIFENESIN ER 600 MG PO TB12: 600.0000 mg | ORAL_TABLET | Freq: Two times a day (BID) | ORAL | 0 refills | Status: AC

## 2022-11-05 NOTE — Progress Notes (Signed)
Patient ID: Martha [name], female   DOB: 04-Aug-1965, 57 y.o.   MRN: [id number] S: Feels much better this morning.  Able to UF 3 liters with HD yesterday. O:BP (!) 117/47 (BP Location: Right Arm)   Pulse 60   Temp 98.2 F (36.8 C) (Oral)   Resp 18   Ht 5\' 7"  (1.702 m)   Wt 124.2 kg   LMP 08/03/2008 (Exact Date)   SpO2 100%   BMI 42.88 kg/m   Intake/Output Summary (Last 24 hours) at 11/05/2022 1009 Last data filed at 11/05/2022 0600 Gross per 24 hour  Intake 250.13 ml  Output 3100 ml  Net -2849.87 ml   Intake/Output: I/O last 3 completed shifts: In: 250.1 [IV Piggyback:250.1] Out: 3100 [Other:3100]  Intake/Output this shift:  No intake/output data recorded. Weight change: 0.593 kg Gen: NAD CVS: RRR Resp:CTA Abd:+BS, soft, NT/ND Ext:no edema, LUE AVF +T/B  Recent Labs  Lab 11/04/22 0024  NA 134*  K 3.0*  CL 94*  CO2 24  GLUCOSE 132*  BUN 39*  CREATININE 9.79*  CALCIUM 9.2   Liver Function Tests: No results for input(s): "AST", "ALT", "ALKPHOS", "BILITOT", "PROT", "ALBUMIN" in the last 168 hours. No results for input(s): "LIPASE", "AMYLASE" in the last 168 hours. No results for input(s): "AMMONIA" in the last 168 hours. CBC: Recent Labs  Lab 11/04/22 0024  WBC 3.5*  NEUTROABS 2.1  HGB 8.2*  HCT 26.7*  MCV 84.2  PLT 116*   Cardiac Enzymes: No results for input(s): "CKTOTAL", "CKMB", "CKMBINDEX", "TROPONINI" in the last 168 hours. CBG: Recent Labs  Lab 11/04/22 0832 11/04/22 1629 11/04/22 2044 11/05/22 0748  GLUCAP 270* 167* 204* 184*    Iron Studies:  Recent Labs    11/05/22 0503  IRON 93  TIBC 180*  FERRITIN 1,557*   Studies/Results: DG Chest 2 View  Result Date: 11/03/2022 CLINICAL DATA:  Shortness of breath EXAM: CHEST - 2 VIEW COMPARISON:  10/17/2021, 07/05/2020 FINDINGS: No consolidation or pleural effusion. Diffuse bronchitic changes. Mild reticulonodular opacities bilaterally. Borderline cardiomegaly. No pneumothorax IMPRESSION: 1.  Diffuse bronchitic changes with mild reticulonodular opacities, possible atypical infection. No consolidative airspace disease. 2. Borderline cardiomegaly without overt edema. Electronically Signed   By: Jasmine Pang M.D.   On: 11/03/2022 23:59    budesonide (PULMICORT) nebulizer solution  0.25 mg Nebulization BID   busPIRone  7.5 mg Oral BID   Chlorhexidine Gluconate Cloth  6 each Topical Q0600   cinacalcet  60 mg Oral Q breakfast   darbepoetin (ARANESP) injection - DIALYSIS  200 mcg Subcutaneous Q Tue-1800   doxercalciferol  3 mcg Intravenous Q T,Th,Sa-HD   gabapentin  100 mg Oral QHS   heparin  5,000 Units Subcutaneous Q8H   insulin aspart  0-5 Units Subcutaneous QHS   insulin aspart  0-6 Units Subcutaneous TID WC   insulin glargine-yfgn  30 Units Subcutaneous Daily   ipratropium-albuterol  3 mL Nebulization Q6H   latanoprost  1 drop Right Eye BID   methylPREDNISolone (SOLU-MEDROL) injection  40 mg Intravenous Q12H   pantoprazole  40 mg Oral Daily   PARoxetine  20 mg Oral Daily   sodium chloride flush  3 mL Intravenous Q12H   timolol  1 drop Right Eye Daily    BMET    Component Value Date/Time   NA 134 (L) 11/04/2022 0024   K 3.0 (L) 11/04/2022 0024   CL 94 (L) 11/04/2022 0024   CO2 24 11/04/2022 0024   GLUCOSE 132 (H) 11/04/2022 0024  BUN 39 (H) 11/04/2022 0024   CREATININE 9.79 (H) 11/04/2022 0024   CALCIUM 9.2 11/04/2022 0024   CALCIUM 9.4 04/10/2017 0425   GFRNONAA 4 (L) 11/04/2022 0024   GFRAA 8 (L) 03/06/2020 0215   CBC    Component Value Date/Time   WBC 3.5 (L) 11/04/2022 0024   RBC 3.17 (L) 11/04/2022 0024   HGB 8.2 (L) 11/04/2022 0024   HGB 11.5 03/16/2013 0000   HCT 26.7 (L) 11/04/2022 0024   HCT 29.3 (L) 04/06/2017 0357   HCT 36 03/16/2013 0000   PLT 116 (L) 11/04/2022 0024   MCV 84.2 11/04/2022 0024   MCV 87.8 03/16/2013 0000   MCH 25.9 (L) 11/04/2022 0024   MCHC 30.7 11/04/2022 0024   RDW 19.0 (H) 11/04/2022 0024   LYMPHSABS 1.1 11/04/2022 0024    MONOABS 0.3 11/04/2022 0024   EOSABS 0.0 11/04/2022 0024   BASOSABS 0.0 11/04/2022 0024   Outpatient HD unit:  [facility] -   270 minutes  TTS BF 400 -  AVF Dialysate flow is 500  2K/Ca 2.5 EDW - 127.5 kg-  they have been challenging   Meds:  calcitriol 1.5 mcg po each tx Mircera 200 mcg every 2 weeks Venofer 50 mg weekly    Heparin 1000 bolus-  800 per hour  Assessment/Plan:  Acute hypoxic respiratory failure - improved with UF of 3 liters with HD yesterday.  She is 3 Kg below edw and will need a new lower edw.  Awaiting for ECHO this morning. ESRD - continue with HD on TTS schedule HTN/Volume - as above, will need to challenge edw as she has been losing weight. 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 Disposition - pt states that she is going home after her ECHO today.  She is to follow up with her outpatient unit tomorrow for HD if ECHO is benign.   Irena Cords, MD [facility]

## 2022-11-05 NOTE — TOC Initial Note (Signed)
Transition of Care [facility]) - Initial/Assessment Note    Patient Details  Name: Martha [name] MRN: [id number] Date of Birth: 30-Dec-1965  Transition of [facility]) CM/SW Contact:    Annice Needy, LCSW Phone Number: 11/05/2022, 11:31 AM  Clinical Narrative:                 Patient is from home alone. Independent with ADLs. Uses walker. Has CAP aide from [facility] for 8 hours M-F, and 4 hours on Sat and Sun. Does not drive, aide takes to appointments.   Expected Discharge Plan: Home/Self Care Barriers to Discharge: No Barriers Identified   Patient Goals and CMS Choice            Expected Discharge Plan and Services       Living arrangements for the past 2 months: Single Family Home                                      Prior Living Arrangements/Services Living arrangements for the past 2 months: Single Family Home Lives with:: Self Patient language and need for interpreter reviewed:: Yes        Need for Family Participation in Patient Care: No (Comment) Care giver support system in place?: No (comment) Current home services: DME (walker) Criminal Activity/Legal Involvement Pertinent to Current Situation/Hospitalization: No - Comment as needed  Activities of Daily Living Home Assistive Devices/Equipment: CPAP, Walker (specify type), Reacher, Tub transfer bench, Transfer board, Nebulizer ADL Screening (condition at time of admission) Patient's cognitive ability adequate to safely complete daily activities?: Yes Is the patient deaf or have difficulty hearing?: No Does the patient have difficulty seeing, even when wearing glasses/contacts?: Yes Does the patient have difficulty concentrating, remembering, or making decisions?: No Patient able to express need for assistance with ADLs?: Yes Does the patient have difficulty dressing or bathing?: Yes Independently performs ADLs?: No Communication: Needs assistance Dressing (OT): Needs assistance Grooming:  Needs assistance Feeding: Needs assistance Bathing: Needs assistance Toileting: Needs assistance Walks in Home: Needs assistance Does the patient have difficulty walking or climbing stairs?: Yes Weakness of Legs: None Weakness of Arms/Hands: None  Permission Sought/Granted                  Emotional Assessment     Affect (typically observed): Appropriate Orientation: : Oriented to Self, Oriented to Place, Oriented to  Time, Oriented to Situation Alcohol / Substance Use: Not Applicable Psych Involvement: No (comment)  Admission diagnosis:  Hypoxia [R09.02] COPD exacerbation [J44.1] Acute respiratory failure with hypoxia [J96.01] Patient Active Problem List   Diagnosis Date Noted   Opioid dependence 10/18/2021   Cellulitis and abscess of hand 10/18/2021   Left hand pain 10/17/2021   Cellulitis of left hand 10/17/2021   ESRD (end stage renal disease) 09/26/2020   Hypoalbuminemia 07/06/2020   Acute respiratory failure with hypoxia 07/06/2020   CAP (community acquired pneumonia) 07/05/2020   Trimalleolar fracture of ankle, closed, right, initial encounter 12/17/2018   Pain and swelling of ankle, right 12/16/2018   Metabolic encephalopathy 09/26/2018   SIRS (systemic inflammatory response syndrome) 06/23/2018   UTI (urinary tract infection) 02/26/2018   Volume overload 02/25/2018   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   Dialysis patient 12/13/2017   ESRD (end stage renal disease) on dialysis    Chronic diarrhea 10/08/2017   Anemia in chronic kidney disease 05/24/2017   Chronic obstructive  pulmonary disease 05/24/2017   Heart failure 05/24/2017   Osteopathy in diseases classified elsewhere, unspecified site 05/24/2017   Renal failure (ARF), acute on chronic 04/22/2017   Anasarca associated with disorder of kidney 04/20/2017   Protein-calorie malnutrition, severe 11/29/2016   HTN (hypertension), malignant 11/20/2016   Bipolar 1 disorder 11/20/2016    Noncompliance with medication regimen 11/20/2016   Panic attack 11/20/2016   Controlled type 2 diabetes mellitus without complication, without long-term current use of insulin 10/27/2016   Sensory disturbance 10/27/2016   Left hemiparesis 10/27/2016   Cholelithiasis 04/26/2014   Hyponatremia 04/26/2014   Gallstones 04/06/2014   Diabetic ulcer of left great toe 10/09/2013   Type 2 diabetes mellitus treated with insulin 05/30/2013   Hyperglycemia due to diabetes mellitus 05/30/2013   Internal hemorrhoids with other complication 04/28/2013   Umbilical hernia without mention of obstruction or gangrene 04/15/2013   Anemia 04/15/2013   Obesity, Class III, BMI 40-49.9 (morbid obesity) 10/08/2012   DM 05/13/2010   Hyperlipidemia 05/13/2010   Morbid obesity 05/13/2010   Depression with anxiety 05/13/2010   RESTLESS LEG SYNDROME 05/13/2010   Essential hypertension 05/13/2010   GERD 05/13/2010   RECTAL BLEEDING 05/13/2010   OSA on CPAP 05/13/2010   Dysphagia, oropharyngeal phase 05/13/2010   PCP:  Carmel Sacramento, NP Pharmacy:   [facility] - [place], [place] - [address] [place] [address] Phone: [phone] Fax: [phone]  [facility] [phone] - [place], [place] - [address] AT [facility] [address] [place] [address] Phone: [phone] Fax: [phone]     Social Determinants of Health (SDOH) Social History: SDOH Screenings   Food Insecurity: No Food Insecurity (11/04/2022)  Housing: Low Risk  (11/04/2022)  Transportation Needs: No Transportation Needs (11/04/2022)  Utilities: Not At Risk (11/04/2022)  Tobacco Use: Medium Risk (11/03/2022)   SDOH Interventions:     Readmission Risk Interventions    10/18/2021   12:12 PM 07/09/2020    3:25 PM  Readmission Risk Prevention Plan  Transportation Screening Complete Complete  HRI or Home Care Consult Complete   Social Work Consult for Recovery Care  Planning/Counseling Complete   Palliative Care Screening Not Applicable   Medication Review Oceanographer) Complete Complete  PCP or Specialist appointment within 3-5 days of discharge  Not Complete  PCP/Specialist Appt Not Complete comments  Discharging to SNF  HRI or Home Care Consult  Complete  SW Recovery Care/Counseling Consult  Complete  Palliative Care Screening  Not Applicable  Skilled Nursing Facility  Complete

## 2022-11-05 NOTE — Progress Notes (Cosign Needed)
Pt becoming agitated. Has taken off telemonitor and says states she is ready to go home. Xanax given and MD notified.

## 2022-11-05 NOTE — Progress Notes (Signed)
*  PRELIMINARY RESULTS* Echocardiogram 2D Echocardiogram has been performed.  Stacey Drain 11/05/2022, 11:42 AM

## 2022-11-05 NOTE — Discharge Summary (Signed)
Physician Discharge Summary  Martha [name] WUJ:[id number] DOB: 08/23/1965 DOA: 11/03/2022  PCP: Carmel Sacramentoerry, [place], NP  Admit date: 11/03/2022 Discharge date: 11/05/2022  Admitted From:  Home  Disposition: Home   Recommendations for Outpatient Follow-up:  Follow up with PCP in 1 weeks Please resume regular hemodialysis schedule Please review all medications with primary providers   Discharge Condition: STABLE   CODE STATUS: FULL DIET: Renal / fluid restricted    Brief Hospitalization Summary: Please see all hospital notes, images, labs for full details of the hospitalization. ADMISSION HPI:  57 y.o. 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  Patient reports 1 week of upper respiratory symptoms that began as a cough and was later associated with nausea and profuse diarrhea occasionally with blood.  Patient depends on reports of others regarding appearance of sputum and or stool since she is legally blind.  She initially presented to urgent care yesterday 4/8 with the symptoms.  She was given a DuoNeb nebulizer short-term oxygen given her presenting O2 sats were 85% on room air.  Unfortunately despite all of these treatments O2 sats decreased to 82%.  She was eventually transferred ported to the [facility] emergency department for further evaluation and treatment.   Since presenting to the ED she was found to have a Tmax of 99.3, white count was slightly low at 3500.  She has remained stable on 2 L nasal cannula oxygen.  She continued to have diarrhea and coughing.  Two-view chest x-ray revealed diffuse bronchitic changes with mild reticulonodular opacities that could represent atypical infection but no consolidative airspace disease.  No overt edema.  Her BNP was 398 and troponin flat at 103 and 93.  She has not had any chest pain.   COVID PCR was negative hospitalist team was asked to evaluate the patient for admission secondary to upper respiratory symptoms causing hypoxemia.   In addition to the above patient is primarily reporting significant coughing that is keeping her awake at night.  She has noticed over the past 2 days decreased urinary output.  She has felt hot for several nights but has not been able to check her temperature at home.  HOSPITAL COURSE BY PROBLEM   ** Home medications were never able to be reconciled correctly during hospitalization and patient not able to assist with home meds.  I advised patient to review all meds with primary providers to be sure she remains on the correct maintenance medications that she is supposed to be on currently -- she verbalized understanding **    Acute hypoxemic respiratory failure likely related to COPD exacerbation with possible superimposed atypical pneumonia Pneumonia likely viral in etiology therefore we will check a respiratory viral panel Provide supportive care with oxygen, scheduled DuoNebs every 6 hours, IV Solu-Medrol and budesonide nebs twice daily Procalcitonin elevated at 1.83 but unsure the significance of this and the patient on dialysis.  Patient did have low-grade fever as well as borderline neutropenia Follow-up on CRP as well.  She had urinary strep.  Utilize empiric IV Zithromax to treat any possible atypical bacterial pneumonia Wean and discontinue oxygen as soon as patient can tolerate   Abnormal EKG Patient denies chest pain but routine EKG did reveal some subtle ST segment downsloping that is nonspecific in the inferior lateral leads Given hypoxia of unknown duartion PTA suspect demand ichemia Night  physician did discuss with on-call cardiologist who stated patient was safe to admit to [facility] but recommended cardiology consult today Troponins are slightly elevated but have a flat trend and is may have no significance in the context of her chronic  kidney disease Repeat EKG and check ECHO (h/o HFpEF) -discussed with cardiology: Echo stable, no worrisome findings, ok to discharge home    Diarrhea - resolved Likely viral in etiology Check a gastrointestinal panel PCR Possible blood in stool that patient thinks is from irritation from repeated wiping Continue to monitor for true rectal bleeding C diff PCR - NEGATIVE    Chronic 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 Kathrene Bongo with nephrology made aware of patient need for dialysis today although she is otherwise stable with hypokalemia and no overt signs of volume overload on clinical exam Bone mineral replacement per nephrology team Electrolyte replacement can be accomplished during hemodialysis treatments -resume regular hemodialysis schedule at discharge   Diabetes mellitus 2 on insulin Follow CBGs AC/at bedtime Initially began with extra sensitive sliding scale replacement insulin since she is on dialysis and can adjust based on glycemic response to steroids Check hemoglobin A1c---5.6% Continue home insulin glargine and adjust dose as needed  CBG (last 3)  Recent Labs    11/04/22 2044 11/05/22 0748 11/05/22 1129  GLUCAP 204* 184* 228*    Persistent thrombocytopenia Unclear significance Will cautiously utilize subcutaneous heparin for DVT prophylaxis but may need to discontinue if platelets continue to drop Baseline platelets in the past 12 months has been anywhere from 126,000 148,000 although have been steadily decreasing Hemoglobin is stable LFTs are stable so do not suspect at this juncture low platelets are related to her Elita Boone cirrhosis   Dyslipidemia Does not appear to be on pharmacotherapy   Hypertension Condition follow-up with hemodialysis   NASH cirrhosis Current LFTs are stable   Sleep apnea on CPAP Unclear if patient is utilizing verbal order at at bedtime   Bipolar disorder  Continue home medications: as needed  Xanax, Paxil and BuSpar   Obesity with BMI 44 Weight reduction strategies per PCP  Patient reports has lost more than 12 pounds last few months   Discharge Diagnoses:  Principal Problem:   Acute respiratory failure with hypoxia   Discharge Instructions:  Allergies as of 11/05/2022       Reactions   Codeine Nausea And Vomiting   Tape Rash, Other (See Comments)   Pull skin off.  Paper tape is ok        Medication List     STOP taking these medications    cephALEXin 500 MG capsule Commonly known as: KEFLEX   cloNIDine 0.1 MG tablet Commonly known as: CATAPRES   diphenoxylate-atropine 2.5-0.025 MG tablet Commonly known as: LOMOTIL   hydrOXYzine 25 MG capsule Commonly known as: VISTARIL   lisinopril-hydrochlorothiazide 10-12.5 MG tablet Commonly known as: ZESTORETIC   losartan 25 MG tablet Commonly known as: COZAAR   prednisoLONE acetate 1 % ophthalmic suspension Commonly known as: PRED FORTE   rOPINIRole 1 MG tablet Commonly known as: REQUIP   simvastatin 40 MG tablet Commonly known as: ZOCOR   timolol 0.5 % ophthalmic solution Commonly known as: TIMOPTIC   tobramycin 0.3 % ophthalmic solution Commonly known as: TOBREX   torsemide 100 MG tablet Commonly known as: DEMADEX   triamcinolone cream 0.1 % Commonly known as: KENALOG       TAKE these medications    Advair Diskus 250-50 MCG/ACT Aepb  Generic drug: fluticasone-salmeterol Inhale 1 puff into the lungs 2 (two) times daily.   albuterol (2.5 MG/3ML) 0.083% nebulizer solution Commonly known as: PROVENTIL Take 3 mLs (2.5 mg total) by nebulization every 12 (twelve) hours as needed for wheezing or shortness of breath.   ALPRAZolam 1 MG tablet Commonly known as: XANAX Take 1 tablet (1 mg total) by mouth 4 (four) times daily as needed for anxiety. What changed: when to take this   atorvastatin 20 MG tablet Commonly known as: LIPITOR Take by mouth.   Basaglar KwikPen 100 UNIT/ML Inject 30  Units into the skin at bedtime.   brimonidine 0.2 % ophthalmic solution Commonly known as: ALPHAGAN PLACE 1 DROP INTO THE RIGHT EYE 3 TIMES A DAY.   busPIRone 7.5 MG tablet Commonly known as: BUSPAR Take 1 tablet (7.5 mg total) by mouth 2 (two) times daily.   calcium carbonate 750 MG chewable tablet Commonly known as: TUMS EX Chew 1-3 tablets by mouth See admin instructions. Take 3 tablets with each meal & take 1 tablets with each snack   cinacalcet 60 MG tablet Commonly known as: SENSIPAR Take 60 mg by mouth daily.   colchicine 0.6 MG tablet Take by mouth.   dextromethorphan 30 MG/5ML liquid Commonly known as: Delsym Take 5 mLs (30 mg total) by mouth 2 (two) times daily as needed for cough.   dorzolamide-timolol 2-0.5 % ophthalmic solution Commonly known as: COSOPT Place 1 drop into the right eye in the morning and at bedtime.   doxercalciferol 4 MCG/2ML injection Commonly known as: HECTOROL Inject 0.5 mLs (1 mcg total) into the vein every Monday, Wednesday, and Friday with hemodialysis.   doxycycline 100 MG capsule Commonly known as: VIBRAMYCIN Take 1 capsule (100 mg total) by mouth 2 (two) times daily for 3 days.   DRY EYES OP Place 1 drop into the right eye 2 (two) times daily.   fluocinonide cream 0.05 % Commonly known as: LIDEX Apply 1 Application topically 2 (two) times daily.   fluticasone 0.05 % cream Commonly known as: CUTIVATE Apply topically.   FORA V30a Blood Glucose System [employer] premium V10 Test test strip Generic drug: glucose blood   gabapentin 100 MG capsule Commonly known as: NEURONTIN Take 1 capsule (100 mg total) by mouth at bedtime. What changed: how much to take   guaiFENesin 600 MG 12 hr tablet Commonly known as: Mucinex Take 1 tablet (600 mg total) by mouth 2 (two) times daily for 5 days. What changed:  when to take this reasons to take this   HYDROcodone-acetaminophen 10-325 MG tablet Commonly known as: NORCO Take 1  tablet by mouth 4 (four) times daily.   hydrOXYzine 25 MG tablet Commonly known as: ATARAX Take 25 mg by mouth 2 (two) times daily.   insulin aspart 100 UNIT/ML injection Commonly known as: novoLOG Inject 4-7 Units into the skin 3 (three) times daily with meals. Sliding scale insulin   Invokana 300 MG Tabs tablet Generic drug: canagliflozin Take by mouth.   Isopto Atropine 1 % ophthalmic solution Generic drug: atropine Apply to eye.   ketoconazole 2 % cream Commonly known as: NIZORAL Apply topically.   latanoprost 0.005 % ophthalmic solution Commonly known as: XALATAN Place 1 drop into the right eye 2 (two) times daily.   lidocaine-prilocaine cream Commonly known as: EMLA Apply 1 application topically as needed (topical anesthesia for hemodialysis if Gebauers and Lidocaine injection are ineffective.). What changed:  when to take this reasons to take  this   omeprazole 40 MG capsule Commonly known as: PRILOSEC Take 40 mg by mouth daily.   ondansetron 4 MG tablet Commonly known as: ZOFRAN TAKE (1) TABLET THREE TIMES DAILY AS NEEDED FOR NAUSEA.   PARoxetine 20 MG tablet Commonly known as: PAXIL Take 1 tablet (20 mg total) by mouth every evening. This is to prevent panic attacks What changed:  when to take this additional instructions        Follow-up Information     Carmel Sacramento, NP. Schedule an appointment as soon as possible for a visit in 1 week(s).   Specialty: Nurse Practitioner Why: Hospital Follow Up Contact information: [address] [place] [address] [phone]                Allergies  Allergen Reactions   Codeine Nausea And Vomiting   Tape Rash and Other (See Comments)    Pull skin off.  Paper tape is ok   Allergies as of 11/05/2022       Reactions   Codeine Nausea And Vomiting   Tape Rash, Other (See Comments)   Pull skin off.  Paper tape is ok        Medication List     STOP taking these medications     cephALEXin 500 MG capsule Commonly known as: KEFLEX   cloNIDine 0.1 MG tablet Commonly known as: CATAPRES   diphenoxylate-atropine 2.5-0.025 MG tablet Commonly known as: LOMOTIL   hydrOXYzine 25 MG capsule Commonly known as: VISTARIL   lisinopril-hydrochlorothiazide 10-12.5 MG tablet Commonly known as: ZESTORETIC   losartan 25 MG tablet Commonly known as: COZAAR   prednisoLONE acetate 1 % ophthalmic suspension Commonly known as: PRED FORTE   rOPINIRole 1 MG tablet Commonly known as: REQUIP   simvastatin 40 MG tablet Commonly known as: ZOCOR   timolol 0.5 % ophthalmic solution Commonly known as: TIMOPTIC   tobramycin 0.3 % ophthalmic solution Commonly known as: TOBREX   torsemide 100 MG tablet Commonly known as: DEMADEX   triamcinolone cream 0.1 % Commonly known as: KENALOG       TAKE these medications    Advair Diskus 250-50 MCG/ACT Aepb Generic drug: fluticasone-salmeterol Inhale 1 puff into the lungs 2 (two) times daily.   albuterol (2.5 MG/3ML) 0.083% nebulizer solution Commonly known as: PROVENTIL Take 3 mLs (2.5 mg total) by nebulization every 12 (twelve) hours as needed for wheezing or shortness of breath.   ALPRAZolam 1 MG tablet Commonly known as: XANAX Take 1 tablet (1 mg total) by mouth 4 (four) times daily as needed for anxiety. What changed: when to take this   atorvastatin 20 MG tablet Commonly known as: LIPITOR Take by mouth.   Basaglar KwikPen 100 UNIT/ML Inject 30 Units into the skin at bedtime.   brimonidine 0.2 % ophthalmic solution Commonly known as: ALPHAGAN PLACE 1 DROP INTO THE RIGHT EYE 3 TIMES A DAY.   busPIRone 7.5 MG tablet Commonly known as: BUSPAR Take 1 tablet (7.5 mg total) by mouth 2 (two) times daily.   calcium carbonate 750 MG chewable tablet Commonly known as: TUMS EX Chew 1-3 tablets by mouth See admin instructions. Take 3 tablets with each meal & take 1 tablets with each snack   cinacalcet 60 MG  tablet Commonly known as: SENSIPAR Take 60 mg by mouth daily.   colchicine 0.6 MG tablet Take by mouth.   dextromethorphan 30 MG/5ML liquid Commonly known as: Delsym Take 5 mLs (30 mg total) by mouth 2 (two) times daily  as needed for cough.   dorzolamide-timolol 2-0.5 % ophthalmic solution Commonly known as: COSOPT Place 1 drop into the right eye in the morning and at bedtime.   doxercalciferol 4 MCG/2ML injection Commonly known as: HECTOROL Inject 0.5 mLs (1 mcg total) into the vein every Monday, Wednesday, and Friday with hemodialysis.   doxycycline 100 MG capsule Commonly known as: VIBRAMYCIN Take 1 capsule (100 mg total) by mouth 2 (two) times daily for 3 days.   DRY EYES OP Place 1 drop into the right eye 2 (two) times daily.   fluocinonide cream 0.05 % Commonly known as: LIDEX Apply 1 Application topically 2 (two) times daily.   fluticasone 0.05 % cream Commonly known as: CUTIVATE Apply topically.   FORA V30a Blood Glucose System [employer] premium V10 Test test strip Generic drug: glucose blood   gabapentin 100 MG capsule Commonly known as: NEURONTIN Take 1 capsule (100 mg total) by mouth at bedtime. What changed: how much to take   guaiFENesin 600 MG 12 hr tablet Commonly known as: Mucinex Take 1 tablet (600 mg total) by mouth 2 (two) times daily for 5 days. What changed:  when to take this reasons to take this   HYDROcodone-acetaminophen 10-325 MG tablet Commonly known as: NORCO Take 1 tablet by mouth 4 (four) times daily.   hydrOXYzine 25 MG tablet Commonly known as: ATARAX Take 25 mg by mouth 2 (two) times daily.   insulin aspart 100 UNIT/ML injection Commonly known as: novoLOG Inject 4-7 Units into the skin 3 (three) times daily with meals. Sliding scale insulin   Invokana 300 MG Tabs tablet Generic drug: canagliflozin Take by mouth.   Isopto Atropine 1 % ophthalmic solution Generic drug: atropine Apply to eye.   ketoconazole 2 %  cream Commonly known as: NIZORAL Apply topically.   latanoprost 0.005 % ophthalmic solution Commonly known as: XALATAN Place 1 drop into the right eye 2 (two) times daily.   lidocaine-prilocaine cream Commonly known as: EMLA Apply 1 application topically as needed (topical anesthesia for hemodialysis if Gebauers and Lidocaine injection are ineffective.). What changed:  when to take this reasons to take this   omeprazole 40 MG capsule Commonly known as: PRILOSEC Take 40 mg by mouth daily.   ondansetron 4 MG tablet Commonly known as: ZOFRAN TAKE (1) TABLET THREE TIMES DAILY AS NEEDED FOR NAUSEA.   PARoxetine 20 MG tablet Commonly known as: PAXIL Take 1 tablet (20 mg total) by mouth every evening. This is to prevent panic attacks What changed:  when to take this additional instructions        Procedures/Studies: ECHOCARDIOGRAM COMPLETE  Result Date: 11/05/2022    ECHOCARDIOGRAM REPORT   Patient Name:   ASHLEYNICOLE MCCLEES Date of Exam: 11/05/2022 Medical Rec #:  [id number]     Height:       67.0 in Accession #:    [id number]    Weight:       273.8 lb Date of Birth:  1966-03-10     BSA:          2.311 m Patient Age:    56 years      BP:           117/47 mmHg Patient Gender: F             HR:           60 bpm. [facility] Procedure: 2D Echo, Cardiac Doppler and Color Doppler Indications:  Abnormal ECG R94.31  History:        Patient has prior history of Echocardiogram examinations, most                 recent 10/19/2021. CHF; Risk Factors:Hypertension, Dyslipidemia                 and Diabetes. PAD, ESRD (end stage renal disease) on dialysis,                 Morbid obesity, OSA on CPAP.  Sonographer:    Celesta Gentile RCS Referring Phys: [id number] ALLISON L ELLIS IMPRESSIONS  1. Left ventricular ejection fraction, by estimation, is 60 to 65%. The left ventricle has normal function. The left ventricle has no regional wall motion abnormalities. Left ventricular diastolic parameters  are consistent with Grade II diastolic dysfunction (pseudonormalization). Elevated left atrial pressure.  2. Right ventricular systolic function is normal. The right ventricular size is normal.  3. Left atrial size was mildly dilated.  4. The mitral valve is normal in structure. No evidence of mitral valve regurgitation. No evidence of mitral stenosis.  5. The aortic valve is tricuspid. Aortic valve regurgitation is not visualized. No aortic stenosis is present.  6. The inferior vena cava is normal in size with greater than 50% respiratory variability, suggesting right atrial pressure of 3 mmHg. FINDINGS  Left Ventricle: Left ventricular ejection fraction, by estimation, is 60 to 65%. The left ventricle has normal function. The left ventricle has no regional wall motion abnormalities. The left ventricular internal cavity size was normal in size. There is  no left ventricular hypertrophy. Left ventricular diastolic parameters are consistent with Grade II diastolic dysfunction (pseudonormalization). Elevated left atrial pressure. Right Ventricle: The right ventricular size is normal. Right vetricular wall thickness was not well visualized. Right ventricular systolic function is normal. Left Atrium: Left atrial size was mildly dilated. Right Atrium: Right atrial size was normal in size. Pericardium: There is no evidence of pericardial effusion. Mitral Valve: The mitral valve is normal in structure. No evidence of mitral valve regurgitation. No evidence of mitral valve stenosis. Tricuspid Valve: The tricuspid valve is normal in structure. Tricuspid valve regurgitation is not demonstrated. No evidence of tricuspid stenosis. Aortic Valve: The aortic valve is tricuspid. Aortic valve regurgitation is not visualized. No aortic stenosis is present. Aortic valve mean gradient measures 7.0 mmHg. Aortic valve peak gradient measures 19.9 mmHg. Aortic valve area, by VTI measures 1.88  cm. Pulmonic Valve: The pulmonic valve was  not well visualized. Pulmonic valve regurgitation is not visualized. No evidence of pulmonic stenosis. Aorta: The aortic root is normal in size and structure. Venous: The inferior vena cava is normal in size with greater than 50% respiratory variability, suggesting right atrial pressure of 3 mmHg. IAS/Shunts: No atrial level shunt detected by color flow Doppler.  LEFT VENTRICLE PLAX 2D LVIDd:         5.00 cm   Diastology LVIDs:         3.00 cm   LV e' medial:    5.66 cm/s LV PW:         1.10 cm   LV E/e' medial:  27.0 LV IVS:        1.00 cm   LV e' lateral:   8.70 cm/s LVOT diam:     1.80 cm   LV E/e' lateral: 17.6 LV SV:         94 LV SV Index:   41 LVOT Area:  2.54 cm  RIGHT VENTRICLE RV S prime:     12.00 cm/s TAPSE (M-mode): 2.1 cm LEFT ATRIUM             Index        RIGHT ATRIUM           Index LA diam:        4.20 cm 1.82 cm/m   RA Area:     18.50 cm LA Vol (A2C):   74.3 ml 32.15 ml/m  RA Volume:   48.70 ml  21.07 ml/m LA Vol (A4C):   87.3 ml 37.77 ml/m LA Biplane Vol: 85.5 ml 37.00 ml/m  AORTIC VALVE AV Area (Vmax):    1.83 cm AV Area (Vmean):   2.07 cm AV Area (VTI):     1.88 cm AV Vmax:           223.00 cm/s AV Vmean:          121.000 cm/s AV VTI:            0.502 m AV Peak Grad:      19.9 mmHg AV Mean Grad:      7.0 mmHg LVOT Vmax:         160.00 cm/s LVOT Vmean:        98.600 cm/s LVOT VTI:          0.371 m LVOT/AV VTI ratio: 0.74  AORTA Ao Root diam: 3.00 cm MITRAL VALVE MV Area (PHT): 3.27 cm     SHUNTS MV Decel Time: 232 msec     Systemic VTI:  0.37 m MV E velocity: 153.00 cm/s  Systemic Diam: 1.80 cm MV A velocity: 57.00 cm/s MV E/A ratio:  2.68 Dina Rich MD Electronically signed by Dina Rich MD Signature Date/Time: 11/05/2022/12:03:27 PM    Final    DG Chest 2 View  Result Date: 11/03/2022 CLINICAL DATA:  Shortness of breath EXAM: CHEST - 2 VIEW COMPARISON:  10/17/2021, 07/05/2020 FINDINGS: No consolidation or pleural effusion. Diffuse bronchitic changes. Mild  reticulonodular opacities bilaterally. Borderline cardiomegaly. No pneumothorax IMPRESSION: 1. Diffuse bronchitic changes with mild reticulonodular opacities, possible atypical infection. No consolidative airspace disease. 2. Borderline cardiomegaly without overt edema. Electronically Signed   By: Jasmine Pang M.D.   On: 11/03/2022 23:59     Subjective:   Discharge Exam: Vitals:   11/05/22 0830 11/05/22 0831  BP:    Pulse:    Resp:    Temp:    SpO2: 100% 100%   Vitals:   11/04/22 2021 11/05/22 0506 11/05/22 0830 11/05/22 0831  BP: 128/86 (!) 117/47    Pulse: 85 60    Resp: 18 18    Temp: 98 F (36.7 C) 98.2 F (36.8 C)    TempSrc: Oral Oral    SpO2: 98% 98% 100% 100%  Weight:      Height:        General: Pt is alert, awake, not in acute distress Cardiovascular: RRR, S1/S2 +, no rubs, no gallops Respiratory: CTA bilaterally, no wheezing, no rhonchi Abdominal: Soft, NT, ND, bowel sounds + Extremities: no edema, no cyanosis   The results of significant diagnostics from this hospitalization (including imaging, microbiology, ancillary and laboratory) are listed below for reference.     Microbiology: Recent Results (from the past 240 hour(s))  SARS Coronavirus 2 by RT PCR (hospital order, performed in [facility] hospital lab) *cepheid single result test* Anterior Nasal Swab     Status: None   Collection Time: 11/03/22  8:33 PM   Specimen: Anterior Nasal Swab  Result Value Ref Range Status   SARS Coronavirus 2 by RT PCR NEGATIVE NEGATIVE Final    Comment: (NOTE) SARS-CoV-2 target nucleic acids are NOT DETECTED.  The SARS-CoV-2 RNA is generally detectable in upper and lower respiratory specimens during the acute phase of infection. The lowest concentration of SARS-CoV-2 viral copies this assay can detect is 250 copies / mL. A negative result does not preclude SARS-CoV-2 infection and should not be used as the sole basis for treatment or other patient management  decisions.  A negative result may occur with improper specimen collection / handling, submission of specimen other than nasopharyngeal swab, presence of viral mutation(s) within the areas targeted by this assay, and inadequate number of viral copies (<250 copies / mL). A negative result must be combined with clinical observations, patient history, and epidemiological information.  Fact Sheet for Patients:   RoadLapTop.co.za  Fact Sheet for Healthcare Providers: http://kim-miller.com/  This test is not yet approved or  cleared by the [place] FDA and has been authorized for detection and/or diagnosis of SARS-CoV-2 by FDA under an Emergency Use Authorization (EUA).  This EUA will remain in effect (meaning this test can be used) for the duration of the COVID-19 declaration under Section 564(b)(1) of the Act, 21 U.S.C. section 360bbb-3(b)(1), unless the authorization is terminated or revoked sooner.  Performed at [facility], [address], [place], [place] [address]   Gastrointestinal Panel by PCR , Stool     Status: None   Collection Time: 11/04/22  3:59 PM   Specimen: STOOL  Result Value Ref Range Status   Campylobacter species NOT DETECTED NOT DETECTED Final   Plesimonas shigelloides NOT DETECTED NOT DETECTED Final   Salmonella species NOT DETECTED NOT DETECTED Final   Yersinia enterocolitica NOT DETECTED NOT DETECTED Final   Vibrio species NOT DETECTED NOT DETECTED Final   Vibrio cholerae NOT DETECTED NOT DETECTED Final   Enteroaggregative E coli (EAEC) NOT DETECTED NOT DETECTED Final   Enteropathogenic E coli (EPEC) NOT DETECTED NOT DETECTED Final   Enterotoxigenic E coli (ETEC) NOT DETECTED NOT DETECTED Final   Shiga like toxin producing E coli (STEC) NOT DETECTED NOT DETECTED Final   Shigella/Enteroinvasive E coli (EIEC) NOT DETECTED NOT DETECTED Final   Cryptosporidium NOT DETECTED NOT DETECTED Final   Cyclospora  cayetanensis NOT DETECTED NOT DETECTED Final   Entamoeba histolytica NOT DETECTED NOT DETECTED Final   Giardia lamblia NOT DETECTED NOT DETECTED Final   Adenovirus F40/41 NOT DETECTED NOT DETECTED Final   Astrovirus NOT DETECTED NOT DETECTED Final   Norovirus GI/GII NOT DETECTED NOT DETECTED Final   Rotavirus A NOT DETECTED NOT DETECTED Final   Sapovirus (I, II, IV, and V) NOT DETECTED NOT DETECTED Final    Comment: Performed at [facility], [address] [place], [place] [address]  C Difficile Quick Screen w PCR reflex     Status: None   Collection Time: 11/04/22  3:59 PM   Specimen: STOOL  Result Value Ref Range Status   C Diff antigen NEGATIVE NEGATIVE Final   C Diff toxin NEGATIVE NEGATIVE Final   C Diff interpretation No C. difficile detected.  Final    Comment: Performed at [facility], [address] [place], [place] [address]     Labs: BNP (last 3 results) Recent Labs    11/04/22 0024  BNP 398.0*   Basic Metabolic Panel: Recent Labs  Lab 11/04/22 0024  NA 134*  K 3.0*  CL 94*  CO2 24  GLUCOSE 132*  BUN 39*  CREATININE 9.79*  CALCIUM 9.2   Liver Function Tests: No results for input(s): "AST", "ALT", "ALKPHOS", "BILITOT", "PROT", "ALBUMIN" in the last 168 hours. No results for input(s): "LIPASE", "AMYLASE" in the last 168 hours. No results for input(s): "AMMONIA" in the last 168 hours. CBC: Recent Labs  Lab 11/04/22 0024  WBC 3.5*  NEUTROABS 2.1  HGB 8.2*  HCT 26.7*  MCV 84.2  PLT 116*   Cardiac Enzymes: No results for input(s): "CKTOTAL", "CKMB", "CKMBINDEX", "TROPONINI" in the last 168 hours. BNP: Invalid input(s): "POCBNP" CBG: Recent Labs  Lab 11/04/22 0832 11/04/22 1629 11/04/22 2044 11/05/22 0748 11/05/22 1129  GLUCAP 270* 167* 204* 184* 228*   D-Dimer No results for input(s): "DDIMER" in the last 72 hours. Hgb A1c Recent Labs    11/04/22 0704  HGBA1C 5.6   Lipid Profile No results for input(s): "CHOL", "HDL",  "LDLCALC", "TRIG", "CHOLHDL", "LDLDIRECT" in the last 72 hours. Thyroid function studies No results for input(s): "TSH", "T4TOTAL", "T3FREE", "THYROIDAB" in the last 72 hours.  Invalid input(s): "FREET3" Anemia work up Recent Labs    11/05/22 0503  FERRITIN 1,557*  TIBC 180*  IRON 93   Urinalysis    Component Value Date/Time   COLORURINE YELLOW 03/06/2020 0311   APPEARANCEUR HAZY (A) 03/06/2020 0311   LABSPEC 1.019 03/06/2020 0311   PHURINE 6.0 03/06/2020 0311   GLUCOSEU 150 (A) 03/06/2020 0311   HGBUR SMALL (A) 03/06/2020 0311   BILIRUBINUR NEGATIVE 03/06/2020 0311   KETONESUR NEGATIVE 03/06/2020 0311   PROTEINUR >=300 (A) 03/06/2020 0311   UROBILINOGEN 0.2 03/17/2015 2236   NITRITE NEGATIVE 03/06/2020 0311   LEUKOCYTESUR NEGATIVE 03/06/2020 0311   Sepsis Labs Recent Labs  Lab 11/04/22 0024  WBC 3.5*   Microbiology Recent Results (from the past 240 hour(s))  SARS Coronavirus 2 by RT PCR (hospital order, performed in [facility] hospital lab) *cepheid single result test* Anterior Nasal Swab     Status: None   Collection Time: 11/03/22  8:33 PM   Specimen: Anterior Nasal Swab  Result Value Ref Range Status   SARS Coronavirus 2 by RT PCR NEGATIVE NEGATIVE Final    Comment: (NOTE) SARS-CoV-2 target nucleic acids are NOT DETECTED.  The SARS-CoV-2 RNA is generally detectable in upper and lower respiratory specimens during the acute phase of infection. The lowest concentration of SARS-CoV-2 viral copies this assay can detect is 250 copies / mL. A negative result does not preclude SARS-CoV-2 infection and should not be used as the sole basis for treatment or other patient management decisions.  A negative result may occur with improper specimen collection / handling, submission of specimen other than nasopharyngeal swab, presence of viral mutation(s) within the areas targeted by this assay, and inadequate number of viral copies (<250 copies / mL). A negative result must  be combined with clinical observations, patient history, and epidemiological information.  Fact Sheet for Patients:   RoadLapTop.co.za  Fact Sheet for Healthcare Providers: http://kim-miller.com/  This test is not yet approved or  cleared by the [place] FDA and has been authorized for detection and/or diagnosis of SARS-CoV-2 by FDA under an Emergency Use Authorization (EUA).  This EUA will remain in effect (meaning this test can be used) for the duration of the COVID-19 declaration under Section 564(b)(1) of the Act, 21 U.S.C. section 360bbb-3(b)(1), unless the authorization is terminated or revoked sooner.  Performed at [facility], [address] [place], [place] [address]   Gastrointestinal Panel by PCR , Stool     Status: None   Collection Time: 11/04/22  3:59 PM   Specimen: STOOL  Result Value Ref Range Status   Campylobacter species NOT DETECTED NOT DETECTED Final   Plesimonas shigelloides NOT DETECTED NOT DETECTED Final   Salmonella species NOT DETECTED NOT DETECTED Final   Yersinia enterocolitica NOT DETECTED NOT DETECTED Final   Vibrio species NOT DETECTED NOT DETECTED Final   Vibrio cholerae NOT DETECTED NOT DETECTED Final   Enteroaggregative E coli (EAEC) NOT DETECTED NOT DETECTED Final   Enteropathogenic E coli (EPEC) NOT DETECTED NOT DETECTED Final   Enterotoxigenic E coli (ETEC) NOT DETECTED NOT DETECTED Final   Shiga like toxin producing E coli (STEC) NOT DETECTED NOT DETECTED Final   Shigella/Enteroinvasive E coli (EIEC) NOT DETECTED NOT DETECTED Final   Cryptosporidium NOT DETECTED NOT DETECTED Final   Cyclospora cayetanensis NOT DETECTED NOT DETECTED Final   Entamoeba histolytica NOT DETECTED NOT DETECTED Final   Giardia lamblia NOT DETECTED NOT DETECTED Final   Adenovirus F40/41 NOT DETECTED NOT DETECTED Final   Astrovirus NOT DETECTED NOT DETECTED Final   Norovirus GI/GII NOT DETECTED NOT DETECTED Final    Rotavirus A NOT DETECTED NOT DETECTED Final   Sapovirus (I, II, IV, and V) NOT DETECTED NOT DETECTED Final    Comment: Performed at [facility], [address] [place], [place] [address]  C Difficile Quick Screen w PCR reflex     Status: None   Collection Time: 11/04/22  3:59 PM   Specimen: STOOL  Result Value Ref Range Status   C Diff antigen NEGATIVE NEGATIVE Final   C Diff toxin NEGATIVE NEGATIVE Final   C Diff interpretation No C. difficile detected.  Final    Comment: Performed at [facility], [address], [place], [place] [address]   Time coordinating discharge: 36 mins   SIGNED:  Standley Dakins, MD  [facility] 11/05/2022, 12:20 PM How to contact the [facility] Attending or Consulting provider 7A - 7P or covering provider during after hours 7P -7A, for this patient?  Check the care team in [facility] and look for a) attending/consulting [facility] provider listed and b) the [facility] team listed Log into [email or web address] and use [place]'s universal password to access. If you do not have the password, please contact the hospital operator. Locate the [facility] provider you are looking for under [facility] and page to a number that you can be directly reached. If you still have difficulty reaching the provider, please page the [facility] (Director on Call) for the Hospitalists listed on amion for assistance.

## 2022-11-05 NOTE — Discharge Instructions (Signed)
IMPORTANT INFORMATION: PAY CLOSE ATTENTION   PHYSICIAN DISCHARGE INSTRUCTIONS  Follow with Primary care provider  [name], Tyler, NP  and other consultants as instructed by your Hospitalist Physician  SEEK MEDICAL CARE OR RETURN TO EMERGENCY ROOM IF SYMPTOMS COME BACK, WORSEN OR NEW PROBLEM DEVELOPS   Please note: You were cared for by a hospitalist during your hospital stay. Every effort will be made to forward records to your primary care provider.  You can request that your primary care provider send for your hospital records if they have not received them.  Once you are discharged, your primary care physician will handle any further medical issues. Please note that NO REFILLS for any discharge medications will be authorized once you are discharged, as it is imperative that you return to your primary care physician (or establish a relationship with a primary care physician if you do not have one) for your post hospital discharge needs so that they can reassess your need for medications and monitor your lab values.  Please get a complete blood count and chemistry panel checked by your Primary MD at your next visit, and again as instructed by your Primary MD.  Get Medicines reviewed and adjusted: Please take all your medications with you for your next visit with your Primary MD  Laboratory/radiological data: Please request your Primary MD to go over all hospital tests and procedure/radiological results at the follow up, please ask your primary care provider to get all Hospital records sent to his/her office.  In some cases, they will be blood work, cultures and biopsy results pending at the time of your discharge. Please request that your primary care provider follow up on these results.  If you are diabetic, please bring your blood sugar readings with you to your follow up appointment with primary care.    Please call and make your follow up appointments as soon as possible.    Also Note the  following: If you experience worsening of your admission symptoms, develop shortness of breath, life threatening emergency, suicidal or homicidal thoughts you must seek medical attention immediately by calling 911 or calling your MD immediately  if symptoms less severe.  You must read complete instructions/literature along with all the possible adverse reactions/side effects for all the Medicines you take and that have been prescribed to you. Take any new Medicines after you have completely understood and accpet all the possible adverse reactions/side effects.   Do not drive when taking Pain medications or sleeping medications (Benzodiazepines)  Do not take more than prescribed Pain, Sleep and Anxiety Medications. It is not advisable to combine anxiety,sleep and pain medications without talking with your primary care practitioner  Special Instructions: If you have smoked or chewed Tobacco  in the last 2 yrs please stop smoking, stop any regular Alcohol  and or any Recreational drug use.  Wear Seat belts while driving.  Do not drive if taking any narcotic, mind altering or controlled substances or recreational drugs or alcohol.       

## 2022-11-05 NOTE — Progress Notes (Signed)
Rounding Note    Patient Name: Martha [name] Date of Encounter: 11/05/2022  [facility] Cardiologist: New  Subjective   Breathing is improving, pleuritic chest pain has resolved  Inpatient Medications    Scheduled Meds:  budesonide (PULMICORT) nebulizer solution  0.25 mg Nebulization BID   busPIRone  7.5 mg Oral BID   Chlorhexidine Gluconate Cloth  6 each Topical Q0600   cinacalcet  60 mg Oral Q breakfast   darbepoetin (ARANESP) injection - DIALYSIS  200 mcg Subcutaneous Q Tue-1800   doxercalciferol  3 mcg Intravenous Q T,Th,Sa-HD   gabapentin  100 mg Oral QHS   heparin  5,000 Units Subcutaneous Q8H   insulin aspart  0-5 Units Subcutaneous QHS   insulin aspart  0-6 Units Subcutaneous TID WC   insulin glargine-yfgn  30 Units Subcutaneous Daily   ipratropium-albuterol  3 mL Nebulization Q6H   latanoprost  1 drop Right Eye BID   methylPREDNISolone (SOLU-MEDROL) injection  40 mg Intravenous Q12H   pantoprazole  40 mg Oral Daily   PARoxetine  20 mg Oral Daily   sodium chloride flush  3 mL Intravenous Q12H   timolol  1 drop Right Eye Daily   Continuous Infusions:  azithromycin 500 mg (11/04/22 0807)   PRN Meds: acetaminophen **OR** acetaminophen, ALPRAZolam, chlorpheniramine-HYDROcodone, heparin, HYDROcodone-acetaminophen, lidocaine (PF), lidocaine-prilocaine, ondansetron **OR** ondansetron (ZOFRAN) IV, pentafluoroprop-tetrafluoroeth, polyvinyl alcohol   Vital Signs    Vitals:   11/04/22 2021 11/05/22 0506 11/05/22 0830 11/05/22 0831  BP: 128/86 (!) 117/47    Pulse: 85 60    Resp: 18 18    Temp: 98 F (36.7 C) 98.2 F (36.8 C)    TempSrc: Oral Oral    SpO2: 98% 98% 100% 100%  Weight:      Height:        Intake/Output Summary (Last 24 hours) at 11/05/2022 0943 Last data filed at 11/05/2022 0600 Gross per 24 hour  Intake 250.13 ml  Output 3100 ml  Net -2849.87 ml      11/04/2022    6:49 PM 11/04/2022    5:45 PM 11/04/2022    1:20 PM  Last 3 Weights   Weight (lbs) 273 lb 13 oz 273 lb 13 oz 281 lb 4.9 oz  Weight (kg) 124.2 kg 124.2 kg 127.6 kg      Telemetry    SR - Personally Reviewed  ECG    N/a - Personally Reviewed  Physical Exam   GEN: No acute distress.   Neck: No JVD Cardiac: RRR, no murmurs, rubs, or gallops.  Respiratory: Clear to auscultation bilaterally. GI: Soft, nontender, non-distended  MS: No edema; No deformity. Neuro:  Nonfocal  Psych: Normal affect   Labs    High Sensitivity Troponin:   Recent Labs  Lab 11/04/22 0024 11/04/22 0127  TROPONINIHS 103* 93*     Chemistry Recent Labs  Lab 11/04/22 0024  NA 134*  K 3.0*  CL 94*  CO2 24  GLUCOSE 132*  BUN 39*  CREATININE 9.79*  CALCIUM 9.2  GFRNONAA 4*  ANIONGAP 16*    Lipids No results for input(s): "CHOL", "TRIG", "HDL", "LABVLDL", "LDLCALC", "CHOLHDL" in the last 168 hours.  Hematology Recent Labs  Lab 11/04/22 0024  WBC 3.5*  RBC 3.17*  HGB 8.2*  HCT 26.7*  MCV 84.2  MCH 25.9*  MCHC 30.7  RDW 19.0*  PLT 116*   Thyroid No results for input(s): "TSH", "FREET4" in the last 168 hours.  BNP Recent Labs  Lab  11/04/22 0024  BNP 398.0*    DDimer No results for input(s): "DDIMER" in the last 168 hours.   Radiology    DG Chest 2 View  Result Date: 11/03/2022 CLINICAL DATA:  Shortness of breath EXAM: CHEST - 2 VIEW COMPARISON:  10/17/2021, 07/05/2020 FINDINGS: No consolidation or pleural effusion. Diffuse bronchitic changes. Mild reticulonodular opacities bilaterally. Borderline cardiomegaly. No pneumothorax IMPRESSION: 1. Diffuse bronchitic changes with mild reticulonodular opacities, possible atypical infection. No consolidative airspace disease. 2. Borderline cardiomegaly without overt edema. Electronically Signed   By: Jasmine Pang M.D.   On: 11/03/2022 23:59    Cardiac Studies     Patient Profile     Martha [name] is a 57 y.o. female with a hx of HFpEF, HTN, Type 2 DM, COPD, OSA, ESRD, blindness, NASH cirrhosis, GERD and  Bipolar Disorder who is being seen 11/04/2022 for the evaluation of abnormal EKG and elevated troponin values at the request of Dr. Thomes Dinning.   Assessment & Plan    1.Elevated troponin - mild elevation trending down in setting of ESRD, pneumonia, COPD exacerbation, hypoxia. Does have some new ST depressions on EKG - atypical chest pain in that its worst with coughing, deep breathing, and position consistent with pleuritic chest pain that would be associated with her pneumonia - echo pending   - suspect demand ischemia -f/u echo results today, if benign no further cards workup inpatient consider outpatient stress testing at f/u pending any symptoms     2. COPD exacerbation/pneumonia - per primary team   3. Diarrhea - per primary team     4. Legally blind  5. ESRD  Would be ok for discharge from cardiac standpoint if echo is benign.   For questions or updates, please contact [facility] Please consult [email or web address] for contact info under        Signed, Dina Rich, MD  11/05/2022, 9:43 AM

## 2022-11-22 ENCOUNTER — Ambulatory Visit
Admission: EM | Admit: 2022-11-22 | Discharge: 2022-11-22 | Disposition: A | Discharge status: Home / Self Care | Payer: Medicaid Other | Attending: Nurse Practitioner | Admitting: Nurse Practitioner

## 2022-11-22 DIAGNOSIS — L02211 Cutaneous abscess of abdominal wall: Secondary | ICD-10-CM | POA: Diagnosis not present

## 2022-11-22 MED ORDER — DOXYCYCLINE HYCLATE 100 MG PO TABS: 100.0000 mg | ORAL_TABLET | Freq: Two times a day (BID) | ORAL | 0 refills | Status: AC

## 2022-11-22 NOTE — ED Triage Notes (Signed)
Pt c/o "spots on stomach that are irritated that may be possible boils are red around area " per pt, they have been there for 6 months and are now causing, x 2 days

## 2022-11-22 NOTE — ED Provider Notes (Signed)
[facility]    CSN: [id number] Arrival date & time: 11/22/22  1159      History   Chief Complaint Chief Complaint  Patient presents with   Wound Check    Spots on stomach that are irritated that may be possible boils are red around area - Entered by patient    HPI Martha [name] is a 57 y.o. female.   The history is provided by the patient.   The patient presents for a "spot" on her lower abdomen that has been there for the past several months.  Patient first endorsed 6 months, then she stated it was 3.  She states over the last several days, she noticed that the area became painful.  She states that she has not noticed that the area has ever drained.  She denies fever, chills, chest pain, abdominal pain, nausea, vomiting, or diarrhea.  Patient also has a second area on her abdomen around her waistline.  Patient states "that is where my pants rub".  Patient states that she has not been treated for th these areas on her abdomen.  She states that one of her doctors told her "not to worry about it".  Patient reports that she is on dialysis, and that she notices that it does hurt when they pull blood when she is having dialysis.  Past Medical History:  Diagnosis Date   Acid reflux    takes Tums   Anemia    Arthritis    Bipolar 1 disorder (HCC)    Blindness of left eye    left eye removed   Carpal tunnel syndrome, bilateral    Cervical radiculopathy    CHF (congestive heart failure) (HCC)    Chronic kidney disease    Stage 5- 01/25/17   Constipation    COPD (chronic obstructive pulmonary disease) (HCC) 2014   bronchitis   Degenerative disc disease, thoracic    Depression    Diabetes mellitus    Type II   Diabetic retinopathy (HCC)    Dyspnea    when walking   End stage kidney disease (HCC)    M, W, F [facility]   Gout    Hypertension    Non-alcoholic cirrhosis (HCC)    Noncompliance with medication regimen    Noncompliance with medication regimen     Obesity (BMI 30-39.9)    OSA (obstructive sleep apnea)    cpap   Panic attack    RLS (restless legs syndrome)     Patient Active Problem List   Diagnosis Date Noted   Opioid dependence (HCC) 10/18/2021   Cellulitis and abscess of hand 10/18/2021   Left hand pain 10/17/2021   Cellulitis of left hand 10/17/2021   ESRD (end stage renal disease) (HCC) 09/26/2020   Hypoalbuminemia 07/06/2020   Acute respiratory failure with hypoxia (HCC) 07/06/2020   CAP (community acquired pneumonia) 07/05/2020   Trimalleolar fracture of ankle, closed, right, initial encounter 12/17/2018   Pain and swelling of ankle, right 12/16/2018   Metabolic encephalopathy 09/26/2018   SIRS (systemic inflammatory response syndrome) (HCC) 06/23/2018   UTI (urinary tract infection) 02/26/2018   Volume overload 02/25/2018   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   Dialysis patient (HCC) 12/13/2017   ESRD (end stage renal disease) on dialysis (HCC)    Chronic diarrhea 10/08/2017   Anemia in chronic kidney disease 05/24/2017   Chronic obstructive pulmonary disease (HCC) 05/24/2017   Heart failure (HCC) 05/24/2017   Osteopathy in diseases classified  elsewhere, unspecified site 05/24/2017   Renal failure (ARF), acute on chronic (HCC) 04/22/2017   Anasarca associated with disorder of kidney 04/20/2017   Protein-calorie malnutrition, severe 11/29/2016   HTN (hypertension), malignant 11/20/2016   Bipolar 1 disorder (HCC) 11/20/2016   Noncompliance with medication regimen 11/20/2016   Panic attack 11/20/2016   Controlled type 2 diabetes mellitus without complication, without long-term current use of insulin (HCC) 10/27/2016   Sensory disturbance 10/27/2016   Left hemiparesis (HCC) 10/27/2016   Cholelithiasis 04/26/2014   Hyponatremia 04/26/2014   Gallstones 04/06/2014   Diabetic ulcer of left great toe (HCC) 10/09/2013   Type 2 diabetes mellitus treated with insulin (HCC) 05/30/2013   Hyperglycemia due  to diabetes mellitus (HCC) 05/30/2013   Internal hemorrhoids with other complication 04/28/2013   Umbilical hernia without mention of obstruction or gangrene 04/15/2013   Anemia 04/15/2013   Obesity, Class III, BMI 40-49.9 (morbid obesity) (HCC) 10/08/2012   DM 05/13/2010   Hyperlipidemia 05/13/2010   Morbid obesity (HCC) 05/13/2010   Depression with anxiety 05/13/2010   RESTLESS LEG SYNDROME 05/13/2010   Essential hypertension 05/13/2010   GERD 05/13/2010   RECTAL BLEEDING 05/13/2010   OSA on CPAP 05/13/2010   Dysphagia, oropharyngeal phase 05/13/2010    Past Surgical History:  Procedure Laterality Date   AORTIC ARCH ANGIOGRAPHY N/A 09/11/2020   Procedure: AORTIC ARCH ANGIOGRAPHY;  Surgeon: Nada Libman, MD;  Location: [facility];  Service: Cardiovascular;  Laterality: N/A;   AV FISTULA PLACEMENT Right 01/27/2017   Procedure: ARTERIOVENOUS (AV) FISTULA CREATION-RIGHT ARM;  Surgeon: Sherren Kerns, MD;  Location: [facility] OR;  Service: Vascular;  Laterality: Right;   AV FISTULA PLACEMENT Left 08/03/2018   Procedure: LEFT ARTERIOVENOUS (AV) FISTULA CREATION;  Surgeon: Sherren Kerns, MD;  Location: [facility] OR;  Service: Vascular;  Laterality: Left;   BASCILIC VEIN TRANSPOSITION Left 04/26/2019   Procedure: SECOND STAGE BASILIC VEIN TRANSPOSITION LEFT ARM;  Surgeon: Sherren Kerns, MD;  Location: [facility] OR;  Service: Vascular;  Laterality: Left;   BIOPSY N/A 05/17/2013   Procedure: BIOPSY;  Surgeon: West Bali, MD;  Location: [facility];  Service: Endoscopy;  Laterality: N/A;   CARPAL TUNNEL RELEASE Left 11/25/2018   Procedure: LEFT CARPAL TUNNEL RELEASE;  Surgeon: Dairl Ponder, MD;  Location: [facility] OR;  Service: Orthopedics;  Laterality: Left;   CATARACT EXTRACTION Right    CHOLECYSTECTOMY N/A 04/27/2014   Procedure: LAPAROSCOPIC CHOLECYSTECTOMY;  Surgeon: Marlane Hatcher, MD;  Location: [facility];  Service: General;  Laterality: N/A;   COLONOSCOPY  06/06/2010   [id number] bleeding  secondary to internal hemorrhoids but incomplete evaluation secondary to poor right colon prep/small rectal and sigmoid colon polyps (hyperplastic). PROPOFOL   COLONOSCOPY  May 2013   Dr. Gilliam/[facility]: 5 mm a descending colon polyp, hyperplastic. Adequate bowel prep.   ENDOMETRIAL ABLATION     ENUCLEATION Left 02/04/2018   ENUCLEATION WITH PLACEMENT OF IMPLANT LEFT EYE   ENUCLEATION Left 02/04/2018   Procedure: ENUCLEATION WITH PLACEMENT OF IMPLANT LEFT EYE;  Surgeon: Floydene Flock, MD;  Location: [facility] OR;  Service: Ophthalmology;  Laterality: Left;   ESOPHAGOGASTRODUODENOSCOPY  06/06/2010   WUJ:[id number] erythema and edema of body of stomach, with sessile polypoid lesions. bx benign. no h.pylori   ESOPHAGOGASTRODUODENOSCOPY (EGD) WITH PROPOFOL N/A 05/17/2013   Dr. Darrick Penna: normal esophagus, moderate nodular gastritis, negative path, empiric Savary dilation   EYE SURGERY  07/2017   sx for glaucoma   FLEXIBLE SIGMOIDOSCOPY N/A 05/17/2013   Dr. Darrick Penna:  moderate sized internal hemorrhoids   HEMORRHOID BANDING N/A 05/17/2013   Procedure: HEMORRHOID BANDING;  Surgeon: West Bali, MD;  Location: [facility];  Service: Endoscopy;  Laterality: N/A;  2 bands placed   INCISION AND DRAINAGE ABSCESS Left 10/11/2013   Procedure: INCISION AND DRAINAGE AND DEBRIDEMENT LEFT BREAST  ABSCESS;  Surgeon: Marlane Hatcher, MD;  Location: [facility];  Service: General;  Laterality: Left;   INSERTION OF DIALYSIS CATHETER N/A 06/08/2018   Procedure: INSERTION OF Right internal jugular TUNNELED  DIALYSIS CATHETER;  Surgeon: Chuck Hint, MD;  Location: [facility] OR;  Service: Vascular;  Laterality: N/A;   IRRIGATION AND DEBRIDEMENT ABSCESS Right 06/01/2013   Procedure: INCISION AND DRAINAGE AND DEBRIDEMENT ABSCESS RIGHT BREAST;  Surgeon: Marlane Hatcher, MD;  Location: [facility];  Service: General;  Laterality: Right;   LEFT EYE REMOVED Left 01/2018   [facility] on [address].   LIGATION OF  ARTERIOVENOUS  FISTULA Right 06/08/2018   Procedure: LIGATION OF ARTERIOVENOUS  FISTULA RIGHT ARM;  Surgeon: Chuck Hint, MD;  Location: [facility] OR;  Service: Vascular;  Laterality: Right;   ORIF ANKLE FRACTURE Right 12/17/2018   Procedure: OPEN REDUCTION INTERNAL FIXATION (ORIF) ANKLE FRACTURE;  Surgeon: Felecia Shelling, DPM;  Location: [facility] OR;  Service: Podiatry;  Laterality: Right;   REVISON OF ARTERIOVENOUS FISTULA Left 09/26/2020   Procedure: LEFT ARTERIOVENOUS FISTULA  DISTAL REVASCULARIZATION AND LIGATION ( DRIL ) USING GREATER SAPHENOUS VEIN;  Surgeon: Maeola Harman, MD;  Location: [facility] OR;  Service: Vascular;  Laterality: Left;   SAVORY DILATION N/A 05/17/2013   Procedure: SAVORY DILATION;  Surgeon: West Bali, MD;  Location: [facility];  Service: Endoscopy;  Laterality: N/A;  14/15/16   SKIN FULL THICKNESS GRAFT Left 05/05/2018   Procedure: ABDOMINAL DERMIS FAT SKIN GRAFT FULL THICKNESS LEFT EYE;  Surgeon: Floydene Flock, MD;  Location: [facility] OR;  Service: Ophthalmology;  Laterality: Left;   TUBAL LIGATION     VEIN HARVEST Left 09/26/2020   Procedure: LEFT LEG GREATER SAPHENOUS VEIN HARVEST;  Surgeon: Maeola Harman, MD;  Location: [facility] OR;  Service: Vascular;  Laterality: Left;    OB History     Gravida  2   Para  2   Term  1   Preterm  1   AB      Living  2      SAB      IAB      Ectopic      Multiple      Live Births               Home Medications    Prior to Admission medications   Medication Sig Start Date End Date Taking? Authorizing Provider  doxycycline (VIBRA-TABS) 100 MG tablet Take 1 tablet (100 mg total) by mouth 2 (two) times daily for 7 days. 11/22/22 11/29/22 Yes Analia Zuk-Warren, Sadie Haber, NP  ADVAIR DISKUS 250-50 MCG/ACT AEPB Inhale 1 puff into the lungs 2 (two) times daily. 07/01/22   [provider]  albuterol (PROVENTIL) (2.5 MG/3ML) 0.083% nebulizer solution Take 3 mLs (2.5 mg total) by nebulization every 12  (twelve) hours as needed for wheezing or shortness of breath. 08/05/22   Valentino Nose, NP  ALPRAZolam Prudy Feeler) 1 MG tablet Take 1 tablet (1 mg total) by mouth 4 (four) times daily as needed for anxiety. Patient taking differently: Take 1 mg by mouth 4 (four) times daily. 07/12/20   Rhetta Mura, MD  Artificial  Tear Ointment (DRY EYES OP) Place 1 drop into the right eye 2 (two) times daily.    [provider]  atorvastatin (LIPITOR) 20 MG tablet Take by mouth. 10/21/18   [provider]  atropine (ISOPTO ATROPINE) 1 % ophthalmic solution Apply to eye. 12/27/19   [provider]  Blood Glucose Monitoring Suppl (FORA V30A BLOOD GLUCOSE SYSTEM) DEVI  09/22/20   [provider]  brimonidine (ALPHAGAN) 0.2 % ophthalmic solution PLACE 1 DROP INTO THE RIGHT EYE 3 TIMES A DAY. 05/24/21   [provider]  busPIRone (BUSPAR) 7.5 MG tablet Take 1 tablet (7.5 mg total) by mouth 2 (two) times daily. 07/12/20   Rhetta Mura, MD  calcium carbonate (TUMS EX) 750 MG chewable tablet Chew 1-3 tablets by mouth See admin instructions. Take 3 tablets with each meal & take 1 tablets with each snack    [provider]  canagliflozin (INVOKANA) 300 MG TABS tablet Take by mouth. 03/02/13   [provider]  cinacalcet (SENSIPAR) 60 MG tablet Take 60 mg by mouth daily.    [provider]  colchicine 0.6 MG tablet Take by mouth. 09/30/18   [provider]  dextromethorphan (DELSYM) 30 MG/5ML liquid Take 5 mLs (30 mg total) by mouth 2 (two) times daily as needed for cough. 11/05/22   Johnson, Clanford L, MD  dorzolamide-timolol (COSOPT) 22.3-6.8 MG/ML ophthalmic solution Place 1 drop into the right eye in the morning and at bedtime. 06/01/20   [provider]  doxercalciferol (HECTOROL) 4 MCG/2ML injection Inject 0.5 mLs (1 mcg total) into the vein every Monday, Wednesday, and Friday with hemodialysis. 12/03/18   Kari Baars, MD   fluocinonide cream (LIDEX) 0.05 % Apply 1 Application topically 2 (two) times daily. 09/23/22   [provider]  fluticasone (CUTIVATE) 0.05 % cream Apply topically. 06/26/22   [provider]  FORACARE PREMIUM V10 TEST test strip  09/22/20   [provider]  gabapentin (NEURONTIN) 100 MG capsule Take 1 capsule (100 mg total) by mouth at bedtime. Patient taking differently: Take 300 mg by mouth at bedtime. 07/12/20   Rhetta Mura, MD  HYDROcodone-acetaminophen (NORCO) 10-325 MG tablet Take 1 tablet by mouth 4 (four) times daily. 08/30/20   [provider]  hydrOXYzine (ATARAX) 25 MG tablet Take 25 mg by mouth 2 (two) times daily. 07/11/22   [provider]  insulin aspart (NOVOLOG) 100 UNIT/ML injection Inject 4-7 Units into the skin 3 (three) times daily with meals. Sliding scale insulin 11/05/22   Johnson, Clanford L, MD  Insulin Glargine (BASAGLAR KWIKPEN) 100 UNIT/ML Inject 30 Units into the skin at bedtime. 07/12/20   Rhetta Mura, MD  ketoconazole (NIZORAL) 2 % cream Apply topically. 10/08/12   [provider]  latanoprost (XALATAN) 0.005 % ophthalmic solution Place 1 drop into the right eye 2 (two) times daily. 08/22/20   [provider]  lidocaine-prilocaine (EMLA) cream Apply 1 application topically as needed (topical anesthesia for hemodialysis if Gebauers and Lidocaine injection are ineffective.). Patient taking differently: Apply 1 application  topically daily as needed (dialysis.). 04/25/17   Johnson, Clanford L, MD  omeprazole (PRILOSEC) 40 MG capsule Take 40 mg by mouth daily.    [provider]  ondansetron (ZOFRAN) 4 MG tablet TAKE (1) TABLET THREE TIMES DAILY AS NEEDED FOR NAUSEA. 07/30/22   [provider]  PARoxetine (PAXIL) 20 MG tablet Take 1 tablet (20 mg total) by mouth every evening. This is to prevent panic attacks  Patient taking differently: Take 20 mg by mouth at bedtime. 07/12/20    Rhetta Mura, MD    Family History Family History  Adopted: Yes  Family history unknown: Yes    Social History Social History   Tobacco Use   Smoking status: Former    Packs/day: 1.50    Years: 25.00    Additional pack years: 0.00    Total pack years: 37.50    Types: Cigarettes    Quit date: 10/23/2007    Years since quitting: 15.0   Smokeless tobacco: Never  Vaping Use   Vaping Use: Never used  Substance Use Topics   Alcohol use: No   Drug use: No     Allergies   Codeine and Tape   Review of Systems Review of Systems Per HPI  Physical Exam Triage Vital Signs ED Triage Vitals  Enc Vitals Group     BP 11/22/22 1218 (!) 92/56     Pulse Rate 11/22/22 1218 73     Resp 11/22/22 1218 18     Temp 11/22/22 1218 97.8 F (36.6 C)     Temp Source 11/22/22 1218 Oral     SpO2 11/22/22 1218 96 %     Weight --      Height --      Head Circumference --      Peak Flow --      Pain Score 11/22/22 1223 6     Pain Loc --      Pain Edu? --      Excl. in GC? --    No data found.  Updated Vital Signs BP (!) 92/56 (BP Location: Right Arm)   Pulse 73   Temp 97.8 F (36.6 C) (Oral)   Resp 18   LMP 08/03/2008 (Exact Date)   SpO2 96%   Visual Acuity Right Eye Distance:   Left Eye Distance:   Bilateral Distance:    Right Eye Near:   Left Eye Near:    Bilateral Near:     Physical Exam Vitals and nursing note reviewed.  Constitutional:      General: She is not in acute distress.    Appearance: Normal appearance.  Eyes:     Extraocular Movements: Extraocular movements intact.     Conjunctiva/sclera: Conjunctivae normal.     Pupils: Pupils are equal, round, and reactive to light.  Cardiovascular:     Rate and Rhythm: Normal rate and regular rhythm.     Pulses: Normal pulses.     Heart sounds: Normal heart sounds.  Pulmonary:     Effort: Pulmonary effort is normal.     Breath sounds: Normal breath sounds.  Abdominal:     General: Bowel sounds are  normal.     Palpations: Abdomen is soft.  Musculoskeletal:     Cervical back: Normal range of motion.  Skin:    General: Skin is warm and dry.     Findings: Wound present.     Comments: See attached images.  Image #1 is located around the patient's baseline above the umbilicus.  Image #2 is located in the right lower quadrant.  There is no oozing, fluctuance, or drainage present.  Neurological:     General: No focal deficit present.     Mental Status: She is alert and oriented to person, place, and time.  Psychiatric:        Mood and Affect: Mood normal.        Behavior: Behavior normal.  UC Treatments / Results  Labs (all labs ordered are listed, but only abnormal results are displayed) Labs Reviewed - No data to display  EKG   Radiology No results found.  Procedures Procedures (including critical care time)  Medications Ordered in UC Medications - No data to display  Initial Impression / Assessment and Plan / UC Course  I have reviewed the triage vital signs and the nursing notes.  Pertinent labs & imaging results that were available during my care of the patient were reviewed by me and considered in my medical decision making (see chart for details).  Will treat patient for cellulitis/abscess of the abdomen.  Will treat with doxycycline 100 mg twice daily for 7 days given the severity of the abscess.  Unable to perform I&D as there is no fluctuance present.  Patient was advised to continue to monitor the area for worsening.  If symptoms fail to improve, would like for the patient to follow-up with her primary care physician for further evaluation.  Patient was given strict ER follow-up precautions.  Patient is in agreement with this plan of care and verbalizes understanding.  All questions were answered.  Patient stable for discharge.   Final Clinical Impressions(s) / UC Diagnoses   Final diagnoses:  Abdominal wall abscess     Discharge Instructions       Take medication as prescribed. May take Tylenol as needed for pain, fever, or general discomfort. Warm compresses to the affected area 3-4 times daily. Clean the area at least twice daily with Dial Gold bar soap or another type of antibacterial soap. Keep the area covered if it begins to drain. Go to the emergency department if the area becomes larger, becomes more painful, begins to spread into the pelvis, if you have foul-smelling drainage from the site or if you develop fever, chills, generalized fatigue, nausea, vomiting, or other concerns.      ED Prescriptions     Medication Sig Dispense Auth. Provider   doxycycline (VIBRA-TABS) 100 MG tablet Take 1 tablet (100 mg total) by mouth 2 (two) times daily for 7 days. 14 tablet Janyia Guion-Warren, Sadie Haber, NP      PDMP not reviewed this encounter.   Abran Cantor, NP 11/22/22 1252

## 2022-11-22 NOTE — Discharge Instructions (Addendum)
Take medication as prescribed. May take Tylenol as needed for pain, fever, or general discomfort. Warm compresses to the affected area 3-4 times daily. Clean the area at least twice daily with Dial Gold bar soap or another type of antibacterial soap. Keep the area covered if it begins to drain. Go to the emergency department if the area becomes larger, becomes more painful, begins to spread into the pelvis, if you have foul-smelling drainage from the site or if you develop fever, chills, generalized fatigue, nausea, vomiting, or other concerns.

## 2022-12-01 ENCOUNTER — Ambulatory Visit: Payer: Medicaid Other | Attending: Physician Assistant | Admitting: Physician Assistant

## 2022-12-01 NOTE — Progress Notes (Deleted)
Cardiology Office Note    Date:  12/01/2022   ID:  Martha [name], DOB 07/12/1966, MRN [id number]  PCP:  Carmel Sacramento, NP  Cardiologist:  None  Electrophysiologist:  None   Chief Complaint: ***  History of Present Illness:   Martha [name] is a 57 y.o. female with history of ESRD on HD, chronic HFpEF, HTN, Type 2 DM, COPD, OSA, NASH cirrhosis, GERD, morbid obesity, bipolar disorder, abnormal CBC by labs seen for post-hospital f/u.   Our team remotely saw her in 2018 for chest pain with nuclear stress test low risk at that time, abnormalities felt due to soft tissue attenuation, cannot exclude mild degree of scar, no ischemic zones. She was recently admitted 10/2022 for acute respiratory failure felt due to AECOPD and superimposed PNA with diarrhea. She was seen by cardiology for elevated troponin/abnormal EKG with atypical chest pain felt associated to PNA. Her abnormal troponin was felt due to demand ischemia. Echo showed EF 60-65%, G2DD, elevated LAP. Dr. Wyline Mood recommended to consider OP stress test.  Samara Deist mgmt per nephro Abonral CBC  Elevated troponin Chronic HFpEF Essential HTN ESRD     Labwork independently reviewed: 10/2022 A1C 5.6, CRP elevated, hsTroponin 103-93, BNP 398,, WBC 3.5, Hgb 8.2, plt 116 , K 3.0, Na 134, Cr 9.79   Past History   Past Medical History:  Diagnosis Date   Acid reflux    takes Tums   Anemia    Arthritis    Bipolar 1 disorder (HCC)    Blindness of left eye    left eye removed   Carpal tunnel syndrome, bilateral    Cervical radiculopathy    CHF (congestive heart failure) (HCC)    Chronic kidney disease    Stage 5- 01/25/17   Constipation    COPD (chronic obstructive pulmonary disease) (HCC) 2014   bronchitis   Degenerative disc disease, thoracic    Depression    Diabetes mellitus    Type II   Diabetic retinopathy (HCC)    Dyspnea    when walking   End stage kidney disease (HCC)    M, W, F [facility]   Gout    Hypertension     Non-alcoholic cirrhosis (HCC)    Noncompliance with medication regimen    Noncompliance with medication regimen    Obesity (BMI 30-39.9)    OSA (obstructive sleep apnea)    cpap   Panic attack    RLS (restless legs syndrome)     Past Surgical History:  Procedure Laterality Date   AORTIC ARCH ANGIOGRAPHY N/A 09/11/2020   Procedure: AORTIC ARCH ANGIOGRAPHY;  Surgeon: Nada Libman, MD;  [facility]: [facility];  Service: Cardiovascular;  Laterality: N/A;   AV FISTULA PLACEMENT Right 01/27/2017   Procedure: ARTERIOVENOUS (AV) FISTULA CREATION-RIGHT ARM;  Surgeon: Sherren Kerns, MD;  Location: [facility] OR;  Service: Vascular;  Laterality: Right;   AV FISTULA PLACEMENT Left 08/03/2018   Procedure: LEFT ARTERIOVENOUS (AV) FISTULA CREATION;  Surgeon: Sherren Kerns, MD;  Location: [facility] OR;  Service: Vascular;  Laterality: Left;   BASCILIC VEIN TRANSPOSITION Left 04/26/2019   Procedure: SECOND STAGE BASILIC VEIN TRANSPOSITION LEFT ARM;  Surgeon: Sherren Kerns, MD;  Location: [facility] OR;  Service: Vascular;  Laterality: Left;   BIOPSY N/A 05/17/2013   Procedure: BIOPSY;  Surgeon: West Bali, MD;  Location: [facility]S;  Service: Endoscopy;  Laterality: N/A;   CARPAL TUNNEL RELEASE Left 11/25/2018   Procedure: LEFT CARPAL TUNNEL RELEASE;  Surgeon: Dairl Ponder, MD;  Location: [facility] OR;  Service: Orthopedics;  Laterality: Left;   CATARACT EXTRACTION Right    CHOLECYSTECTOMY N/A 04/27/2014   Procedure: LAPAROSCOPIC CHOLECYSTECTOMY;  Surgeon: Marlane Hatcher, MD;  Location: [facility];  Service: General;  Laterality: N/A;   COLONOSCOPY  06/06/2010   WUJ:WJXBJY bleeding secondary to internal hemorrhoids but incomplete evaluation secondary to poor right colon prep/small rectal and sigmoid colon polyps (hyperplastic). PROPOFOL   COLONOSCOPY  May 2013   Dr. Gilliam/[facility]: 5 mm a descending colon polyp, hyperplastic. Adequate bowel prep.   ENDOMETRIAL ABLATION     ENUCLEATION Left 02/04/2018    ENUCLEATION WITH PLACEMENT OF IMPLANT LEFT EYE   ENUCLEATION Left 02/04/2018   Procedure: ENUCLEATION WITH PLACEMENT OF IMPLANT LEFT EYE;  Surgeon: Floydene Flock, MD;  Location: [facility] OR;  Service: Ophthalmology;  Laterality: Left;   ESOPHAGOGASTRODUODENOSCOPY  06/06/2010   NWG:NFAOZHY erythema and edema of body of stomach, with sessile polypoid lesions. bx benign. no h.pylori   ESOPHAGOGASTRODUODENOSCOPY (EGD) WITH PROPOFOL N/A 05/17/2013   Dr. Darrick Penna: normal esophagus, moderate nodular gastritis, negative path, empiric Savary dilation   EYE SURGERY  07/2017   sx for glaucoma   FLEXIBLE SIGMOIDOSCOPY N/A 05/17/2013   Dr. Darrick Penna: moderate sized internal hemorrhoids   HEMORRHOID BANDING N/A 05/17/2013   Procedure: HEMORRHOID BANDING;  Surgeon: West Bali, MD;  Location: [facility];  Service: Endoscopy;  Laterality: N/A;  2 bands placed   INCISION AND DRAINAGE ABSCESS Left 10/11/2013   Procedure: INCISION AND DRAINAGE AND DEBRIDEMENT LEFT BREAST  ABSCESS;  Surgeon: Marlane Hatcher, MD;  Location: [facility];  Service: General;  Laterality: Left;   INSERTION OF DIALYSIS CATHETER N/A 06/08/2018   Procedure: INSERTION OF Right internal jugular TUNNELED  DIALYSIS CATHETER;  Surgeon: Chuck Hint, MD;  Location: [facility] OR;  Service: Vascular;  Laterality: N/A;   IRRIGATION AND DEBRIDEMENT ABSCESS Right 06/01/2013   Procedure: INCISION AND DRAINAGE AND DEBRIDEMENT ABSCESS RIGHT BREAST;  Surgeon: Marlane Hatcher, MD;  Location: [facility];  Service: General;  Laterality: Right;   LEFT EYE REMOVED Left 01/2018   [facility] [address].   LIGATION OF ARTERIOVENOUS  FISTULA Right 06/08/2018   Procedure: LIGATION OF ARTERIOVENOUS  FISTULA RIGHT ARM;  Surgeon: Chuck Hint, MD;  Location: [facility] OR;  Service: Vascular;  Laterality: Right;   ORIF ANKLE FRACTURE Right 12/17/2018   Procedure: OPEN REDUCTION INTERNAL FIXATION (ORIF) ANKLE FRACTURE;  Surgeon: Felecia Shelling, DPM;   Location: [facility] OR;  Service: Podiatry;  Laterality: Right;   REVISON OF ARTERIOVENOUS FISTULA Left 09/26/2020   Procedure: LEFT ARTERIOVENOUS FISTULA  DISTAL REVASCULARIZATION AND LIGATION ( DRIL ) USING GREATER SAPHENOUS VEIN;  Surgeon: Maeola Harman, MD;  Location: [facility] OR;  Service: Vascular;  Laterality: Left;   SAVORY DILATION N/A 05/17/2013   Procedure: SAVORY DILATION;  Surgeon: West Bali, MD;  Location: [facility];  Service: Endoscopy;  Laterality: N/A;  14/15/16   SKIN FULL THICKNESS GRAFT Left 05/05/2018   Procedure: ABDOMINAL DERMIS FAT SKIN GRAFT FULL THICKNESS LEFT EYE;  Surgeon: Floydene Flock, MD;  Location: [facility] OR;  Service: Ophthalmology;  Laterality: Left;   TUBAL LIGATION     VEIN HARVEST Left 09/26/2020   Procedure: LEFT LEG GREATER SAPHENOUS VEIN HARVEST;  Surgeon: Maeola Harman, MD;  Location: [facility] OR;  Service: Vascular;  Laterality: Left;    Current Medications: No outpatient medications have been marked as taking for the 12/01/22 encounter (Appointment)  with Laurann Montana, PA-C.   ***   Allergies:   Codeine and Tape   Social History   Socioeconomic History   Marital status: Significant Other    Spouse name: Not on file   Number of children: 2   Years of education: Not on file   Highest education level: Not on file  Occupational History   Occupation: Disabled  Tobacco Use   Smoking status: Former    Packs/day: 1.50    Years: 25.00    Additional pack years: 0.00    Total pack years: 37.50    Types: Cigarettes    Quit date: 10/23/2007    Years since quitting: 15.1   Smokeless tobacco: Never  Vaping Use   Vaping Use: Never used  Substance and Sexual Activity   Alcohol use: No   Drug use: No   Sexual activity: Yes    Birth control/protection: Surgical  Other Topics Concern   Not on file  Social History Narrative   Lives with fiance.     Social Determinants of Health   Financial Resource Strain: Not on file  Food Insecurity: No  Food Insecurity (11/04/2022)   Hunger Vital Sign    Worried About Running Out of Food in the Last Year: Never true    Ran Out of Food in the Last Year: Never true  Transportation Needs: No Transportation Needs (11/04/2022)   PRAPARE - Administrator, Civil Service (Medical): No    Lack of Transportation (Non-Medical): No  Physical Activity: Not on file  Stress: Not on file  Social Connections: Not on file     Family History:  The patient's ***She was adopted. Family history is unknown by patient.  ROS:   Please see the history of present illness. Otherwise, review of systems is positive for ***.  All other systems are reviewed and otherwise negative.    EKG(s)/Additional Testing   EKG:  EKG is ordered today, personally reviewed, demonstrating ***  CV Studies: Cardiac studies reviewed are outlined and summarized above. Otherwise please see EMR for full report.  Recent Labs: 11/04/2022: B Natriuretic Peptide 398.0; BUN 39; Creatinine, Ser 9.79; Hemoglobin 8.2; Platelets 116; Potassium 3.0; Sodium 134  Recent Lipid Panel    Component Value Date/Time   CHOL 95 10/28/2016 0504   TRIG 122 10/28/2016 0504   HDL 31 (L) 10/28/2016 0504   CHOLHDL 3.1 10/28/2016 0504   VLDL 24 10/28/2016 0504   LDLCALC 40 10/28/2016 0504    PHYSICAL EXAM:    VS:  LMP 08/03/2008 (Exact Date)   BMI: There is no height or weight on file to calculate BMI.  GEN: Well nourished, well developed female in no acute distress HEENT: normocephalic, atraumatic Neck: no JVD, carotid bruits, or masses Cardiac: ***RRR; no murmurs, rubs, or gallops, no edema  Respiratory:  clear to auscultation bilaterally, normal work of breathing GI: soft, nontender, nondistended, + BS MS: no deformity or atrophy Skin: warm and dry, no rash Neuro:  Alert and Oriented x 3, Strength and sensation are intact, follows commands Psych: euthymic mood, full affect  Wt Readings from Last 3 Encounters:  11/04/22 273 lb 13 oz  (124.2 kg)  10/18/21 (!) 322 lb 15.6 oz (146.5 kg)  10/17/21 (!) 305 lb (138.3 kg)     ASSESSMENT & PLAN:   ***     Disposition: F/u with ***   Medication Adjustments/Labs and Tests Ordered: Current medicines are reviewed at length with the patient today.  Concerns  regarding medicines are outlined above. Medication changes, Labs and Tests ordered today are summarized above and listed in the Patient Instructions accessible in Encounters.    Signed, Laurann Montana, PA-C  12/01/2022 7:54 AM    [facility] [place] Location in [facility] [address] [place], [place] [address] Ph: [phone]; Fax [phone]

## 2023-01-06 ENCOUNTER — Emergency Department (HOSPITAL_COMMUNITY): Payer: Medicaid Other

## 2023-01-06 ENCOUNTER — Other Ambulatory Visit: Payer: Self-pay

## 2023-01-06 ENCOUNTER — Encounter (HOSPITAL_COMMUNITY): Payer: Self-pay | Admitting: *Deleted

## 2023-01-06 ENCOUNTER — Emergency Department (HOSPITAL_COMMUNITY)
Admission: EM | Admit: 2023-01-06 | Discharge: 2023-01-06 | Disposition: A | Discharge status: Home / Self Care | Payer: Medicaid Other | Attending: Emergency Medicine | Admitting: Emergency Medicine

## 2023-01-06 DIAGNOSIS — I120 Hypertensive chronic kidney disease with stage 5 chronic kidney disease or end stage renal disease: Secondary | ICD-10-CM | POA: Diagnosis not present

## 2023-01-06 DIAGNOSIS — X58XXXA Exposure to other specified factors, initial encounter: Secondary | ICD-10-CM | POA: Insufficient documentation

## 2023-01-06 DIAGNOSIS — N186 End stage renal disease: Secondary | ICD-10-CM | POA: Diagnosis not present

## 2023-01-06 DIAGNOSIS — J449 Chronic obstructive pulmonary disease, unspecified: Secondary | ICD-10-CM | POA: Diagnosis not present

## 2023-01-06 DIAGNOSIS — Z794 Long term (current) use of insulin: Secondary | ICD-10-CM | POA: Insufficient documentation

## 2023-01-06 DIAGNOSIS — Z992 Dependence on renal dialysis: Secondary | ICD-10-CM | POA: Diagnosis not present

## 2023-01-06 DIAGNOSIS — E1122 Type 2 diabetes mellitus with diabetic chronic kidney disease: Secondary | ICD-10-CM | POA: Insufficient documentation

## 2023-01-06 DIAGNOSIS — L03311 Cellulitis of abdominal wall: Secondary | ICD-10-CM

## 2023-01-06 DIAGNOSIS — S31103A Unspecified open wound of abdominal wall, right lower quadrant without penetration into peritoneal cavity, initial encounter: Secondary | ICD-10-CM | POA: Insufficient documentation

## 2023-01-06 DIAGNOSIS — Z79899 Other long term (current) drug therapy: Secondary | ICD-10-CM | POA: Insufficient documentation

## 2023-01-06 LAB — APTT: aPTT: 32 seconds (ref 24–36)

## 2023-01-06 LAB — CBC
HCT: 33.1 % — ABNORMAL LOW (ref 36.0–46.0)
Hemoglobin: 10.2 g/dL — ABNORMAL LOW (ref 12.0–15.0)
MCH: 27.3 pg (ref 26.0–34.0)
MCHC: 30.8 g/dL (ref 30.0–36.0)
MCV: 88.5 fL (ref 80.0–100.0)
Platelets: 149 10*3/uL — ABNORMAL LOW (ref 150–400)
RBC: 3.74 MIL/uL — ABNORMAL LOW (ref 3.87–5.11)
RDW: 18.5 % — ABNORMAL HIGH (ref 11.5–15.5)
WBC: 5.9 10*3/uL (ref 4.0–10.5)
nRBC: 0 % (ref 0.0–0.2)

## 2023-01-06 LAB — COMPREHENSIVE METABOLIC PANEL
ALT: 8 U/L (ref 0–44)
AST: 7 U/L — ABNORMAL LOW (ref 15–41)
Albumin: 3.8 g/dL (ref 3.5–5.0)
Alkaline Phosphatase: 109 U/L (ref 38–126)
Anion gap: 23 — ABNORMAL HIGH (ref 5–15)
BUN: 40 mg/dL — ABNORMAL HIGH (ref 6–20)
CO2: 23 mmol/L (ref 22–32)
Calcium: 9.7 mg/dL (ref 8.9–10.3)
Chloride: 92 mmol/L — ABNORMAL LOW (ref 98–111)
Creatinine, Ser: 10.19 mg/dL — ABNORMAL HIGH (ref 0.44–1.00)
GFR, Estimated: 4 mL/min — ABNORMAL LOW (ref >60)
Glucose, Bld: 126 mg/dL — ABNORMAL HIGH (ref 70–99)
Potassium: 4.2 mmol/L (ref 3.5–5.1)
Sodium: 138 mmol/L (ref 135–145)
Total Bilirubin: 1.1 mg/dL (ref 0.3–1.2)
Total Protein: 7.9 g/dL (ref 6.5–8.1)

## 2023-01-06 LAB — LACTIC ACID, PLASMA: Lactic Acid, Venous: 1.3 mmol/L (ref 0.5–1.9)

## 2023-01-06 LAB — PROTIME-INR
INR: 1.3 — ABNORMAL HIGH (ref 0.8–1.2)
Prothrombin Time: 16.2 seconds — ABNORMAL HIGH (ref 11.4–15.2)

## 2023-01-06 LAB — CULTURE, BLOOD (ROUTINE X 2)

## 2023-01-06 MED ORDER — OXYCODONE HCL 5 MG PO TABS: 5.0000 mg | ORAL_TABLET | Freq: Four times a day (QID) | ORAL | 0 refills | Status: DC | PRN

## 2023-01-06 MED ORDER — HYDROMORPHONE HCL 1 MG/ML IJ SOLN
1.0000 mg | Freq: Once | INTRAMUSCULAR | Status: AC
  Administered 2023-01-06: 1 mg via INTRAVENOUS
  Filled 2023-01-06: qty 1

## 2023-01-06 MED ORDER — FENTANYL CITRATE PF 50 MCG/ML IJ SOSY
50.0000 ug | PREFILLED_SYRINGE | Freq: Once | INTRAMUSCULAR | Status: DC
  Filled 2023-01-06: qty 1

## 2023-01-06 MED ORDER — ONDANSETRON 4 MG PO TBDP
4.0000 mg | ORAL_TABLET | Freq: Once | ORAL | Status: AC
  Administered 2023-01-06: 4 mg via ORAL
  Filled 2023-01-06: qty 1

## 2023-01-06 MED ORDER — MEDIHONEY WOUND/BURN DRESSING EX PSTE
1.0000 | PASTE | Freq: Every day | CUTANEOUS | Status: DC
  Administered 2023-01-06: 1 via TOPICAL
  Filled 2023-01-06: qty 44

## 2023-01-06 MED ORDER — AMOXICILLIN-POT CLAVULANATE 875-125 MG PO TABS
1.0000 | ORAL_TABLET | Freq: Once | ORAL | Status: AC
  Administered 2023-01-06: 1 via ORAL
  Filled 2023-01-06: qty 1

## 2023-01-06 MED ORDER — AMOXICILLIN-POT CLAVULANATE 875-125 MG PO TABS: 1.0000 | ORAL_TABLET | Freq: Two times a day (BID) | ORAL | 0 refills | Status: AC

## 2023-01-06 MED ORDER — PROMETHAZINE HCL 25 MG PO TABS: 25.0000 mg | ORAL_TABLET | Freq: Four times a day (QID) | ORAL | 0 refills | Status: DC | PRN

## 2023-01-06 MED ORDER — MEDIHONEY WOUND/BURN DRESSING EX PSTE: 1.0000 | PASTE | Freq: Every day | CUTANEOUS | 0 refills | Status: DC

## 2023-01-06 NOTE — Discharge Instructions (Addendum)
You were seen in the emergency department for an abdominal wound.  As we discussed, the infection appears to be contained to your skin. I discussed your case with an infectious disease specialist, and they want to put you an additional antibiotic to the one you are receiving at dialysis. You will take this twice daily for the next 10 days.   I've sent some pain medication for break through pain, take this with your normal pain medication regimen. I've also sent a nausea medication. Take this as needed and prior to dialysis.   We also want you to be using a Medihoney cream on the wound whenever you are redressing it.   Please follow up with the [facility] facility here in [place]. I have sent a referral for you.  Continue to monitor how you're doing and return to the ER for new or worsening symptoms.

## 2023-01-06 NOTE — ED Provider Notes (Signed)
[place] EMERGENCY DEPARTMENT AT [facility] Provider Note   CSN: [id number] Arrival date & time: 01/06/23  1048     History  Chief Complaint  Patient presents with   Wound Infection    Martha [name] is a 57 y.o. female with history of insulin-dependent type 2 diabetes, bipolar 1 disorder, COPD, ESRD on hemodialysis TTS, nonalcoholic cirrhosis, sleep apnea on CPAP, hypertension, restless legs who presents the emergency department complaining of pain at site of wound.  She has had an area in her right lower abdomen on the skin which had a wound.  She went to urgent care few months ago unresponsive antibiotics.  She states that her dialysis center put her on a 72-month course of antibiotic starting on 5/22.  She has been taking this as prescribed, but has been getting more pain to the area.  Now having foul-smelling drainage.  Denies any fever.  HPI     Home Medications Prior to Admission medications   Medication Sig Start Date End Date Taking? Authorizing Provider  ADVAIR DISKUS 250-50 MCG/ACT AEPB Inhale 1 puff into the lungs 2 (two) times daily. 07/01/22   [provider]  albuterol (PROVENTIL) (2.5 MG/3ML) 0.083% nebulizer solution Take 3 mLs (2.5 mg total) by nebulization every 12 (twelve) hours as needed for wheezing or shortness of breath. 08/05/22   Valentino Nose, NP  ALPRAZolam Prudy Feeler) 1 MG tablet Take 1 tablet (1 mg total) by mouth 4 (four) times daily as needed for anxiety. Patient taking differently: Take 1 mg by mouth 4 (four) times daily. 07/12/20   Rhetta Mura, MD  Artificial Tear Ointment (DRY EYES OP) Place 1 drop into the right eye 2 (two) times daily.    [provider]  atorvastatin (LIPITOR) 20 MG tablet Take by mouth. 10/21/18   [provider]  atropine (ISOPTO ATROPINE) 1 % ophthalmic solution Apply to eye. 12/27/19   [provider]  Blood Glucose Monitoring Suppl (FORA V30A BLOOD GLUCOSE SYSTEM) DEVI   09/22/20   [provider]  brimonidine (ALPHAGAN) 0.2 % ophthalmic solution PLACE 1 DROP INTO THE RIGHT EYE 3 TIMES A DAY. 05/24/21   [provider]  busPIRone (BUSPAR) 7.5 MG tablet Take 1 tablet (7.5 mg total) by mouth 2 (two) times daily. 07/12/20   Rhetta Mura, MD  calcium carbonate (TUMS EX) 750 MG chewable tablet Chew 1-3 tablets by mouth See admin instructions. Take 3 tablets with each meal & take 1 tablets with each snack    [provider]  canagliflozin (INVOKANA) 300 MG TABS tablet Take by mouth. 03/02/13   [provider]  cinacalcet (SENSIPAR) 60 MG tablet Take 60 mg by mouth daily.    [provider]  colchicine 0.6 MG tablet Take by mouth. 09/30/18   [provider]  dextromethorphan (DELSYM) 30 MG/5ML liquid Take 5 mLs (30 mg total) by mouth 2 (two) times daily as needed for cough. 11/05/22   Johnson, Clanford L, MD  dorzolamide-timolol (COSOPT) 22.3-6.8 MG/ML ophthalmic solution Place 1 drop into the right eye in the morning and at bedtime. 06/01/20   [provider]  doxercalciferol (HECTOROL) 4 MCG/2ML injection Inject 0.5 mLs (1 mcg total) into the vein every Monday, Wednesday, and Friday with hemodialysis. 12/03/18   Kari Baars, MD  fluocinonide cream (LIDEX) 0.05 % Apply 1 Application topically 2 (two) times daily. 09/23/22   [provider]  fluticasone (CUTIVATE) 0.05 % cream Apply topically. 06/26/22   [provider]  FORACARE PREMIUM V10 TEST test strip  09/22/20   [provider]  gabapentin (NEURONTIN) 100 MG capsule Take 1 capsule (100 mg total) by mouth at bedtime. Patient taking differently: Take 300 mg by mouth at bedtime. 07/12/20   Rhetta Mura, MD  HYDROcodone-acetaminophen (NORCO) 10-325 MG tablet Take 1 tablet by mouth 4 (four) times daily. 08/30/20   [provider]  hydrOXYzine (ATARAX) 25 MG tablet Take 25 mg by mouth 2 (two) times daily. 07/11/22    [provider]  insulin aspart (NOVOLOG) 100 UNIT/ML injection Inject 4-7 Units into the skin 3 (three) times daily with meals. Sliding scale insulin 11/05/22   Johnson, Clanford L, MD  Insulin Glargine (BASAGLAR KWIKPEN) 100 UNIT/ML Inject 30 Units into the skin at bedtime. 07/12/20   Rhetta Mura, MD  ketoconazole (NIZORAL) 2 % cream Apply topically. 10/08/12   [provider]  latanoprost (XALATAN) 0.005 % ophthalmic solution Place 1 drop into the right eye 2 (two) times daily. 08/22/20   [provider]  lidocaine-prilocaine (EMLA) cream Apply 1 application topically as needed (topical anesthesia for hemodialysis if Gebauers and Lidocaine injection are ineffective.). Patient taking differently: Apply 1 application  topically daily as needed (dialysis.). 04/25/17   Johnson, Clanford L, MD  omeprazole (PRILOSEC) 40 MG capsule Take 40 mg by mouth daily.    [provider]  ondansetron (ZOFRAN) 4 MG tablet TAKE (1) TABLET THREE TIMES DAILY AS NEEDED FOR NAUSEA. 07/30/22   [provider]  PARoxetine (PAXIL) 20 MG tablet Take 1 tablet (20 mg total) by mouth every evening. This is to prevent panic attacks Patient taking differently: Take 20 mg by mouth at bedtime. 07/12/20   Rhetta Mura, MD      Allergies    Codeine and Tape    Review of Systems   Review of Systems  Skin:  Positive for wound.  All other systems reviewed and are negative.   Physical Exam Updated Vital Signs BP (!) 199/78   Pulse 66   Temp 98.1 F (36.7 C) (Oral)   Resp (!) 32   Ht 5\' 7"  (1.702 m)   Wt 121.6 kg   LMP 08/03/2008 (Exact Date)   SpO2 95%   BMI 41.97 kg/m  Physical Exam Vitals and nursing note reviewed.  Constitutional:      Appearance: Normal appearance.  HENT:     Head: Normocephalic and atraumatic.  Eyes:     Conjunctiva/sclera: Conjunctivae normal.  Pulmonary:     Effort: Pulmonary effort is normal. No respiratory distress.  Abdominal:      Comments: Open wound with purulent malodorous drainage in RLQ as imaged below  Skin:    General: Skin is warm and dry.  Neurological:     Mental Status: She is alert.  Psychiatric:        Mood and Affect: Mood normal.        Behavior: Behavior normal.      ED Results / Procedures / Treatments   Labs (all labs ordered are listed, but only abnormal results are displayed) Labs Reviewed  CBC - Abnormal; Notable for the following components:      Result Value   RBC 3.74 (*)    Hemoglobin 10.2 (*)    HCT 33.1 (*)    RDW 18.5 (*)    Platelets 149 (*)    All other components within normal limits  COMPREHENSIVE METABOLIC PANEL - Abnormal; Notable for the following components:   Chloride 92 (*)  Glucose, Bld 126 (*)    BUN 40 (*)    Creatinine, Ser 10.19 (*)    AST 7 (*)    GFR, Estimated 4 (*)    Anion gap 23 (*)    All other components within normal limits  PROTIME-INR - Abnormal; Notable for the following components:   Prothrombin Time 16.2 (*)    INR 1.3 (*)    All other components within normal limits  CULTURE, BLOOD (ROUTINE X 2)  CULTURE, BLOOD (ROUTINE X 2)  LACTIC ACID, PLASMA  APTT    EKG EKG Interpretation  Date/Time:  Tuesday January 06 2023 13:03:36 EDT Ventricular Rate:  64 PR Interval:  152 QRS Duration: 90 QT Interval:  457 QTC Calculation: 472 R Axis:   46 Text Interpretation: Sinus rhythm no acute ST/T changes Confirmed by Pricilla Loveless [phone]) on 01/06/2023 2:20:32 PM  Radiology No results found.  Procedures Procedures    Medications Ordered in ED Medications  ondansetron (ZOFRAN-ODT) disintegrating tablet 4 mg (4 mg Oral Given 01/06/23 1345)  HYDROmorphone (DILAUDID) injection 1 mg (1 mg Intravenous Given 01/06/23 1351)    ED Course/ Medical Decision Making/ A&P                             Medical Decision Making  This patient is a 57 y.o. female  who presents to the ED for concern of wound infection.   Differential diagnoses  prior to evaluation: The emergent differential diagnosis includes, but is not limited to,  abscess, cellulitis, deep space infection. This is not an exhaustive differential.   Past Medical History / Co-morbidities / Social History: insulin-dependent type 2 diabetes, bipolar 1 disorder, COPD, ESRD on hemodialysis TTS, nonalcoholic cirrhosis, sleep apnea on CPAP, hypertension, restless legs  Additional history: Chart reviewed. Pertinent results include: Patient seen at urgent care on 4/27 with concern for abdominal wall abscess.  No I&D was performed, but was discharged on doxycycline. Cannot view records from dialysis center regarding recent initiation of antibiotics. Dialysis nurse was contacted and reported patient is on 500 mg Vancomycin from 6/8-6/19.   Physical Exam: Physical exam performed. The pertinent findings include: open wound to the right lower quadrant of the abdomen with malodorous drainage  Lab Tests/Imaging studies: I personally interpreted labs/imaging and the pertinent results include:  no leukocytosis, stable hemoglobin. CMP at baseline. Wound culture collected and pending. Normal lactic acid.  CT abdomen/pelvis with focal skin lesion and underlying soft tissue gas consistent with cellulitis, no loculated collections.  I agree with the radiologist interpretation.  Cardiac monitoring: EKG obtained and interpreted by my attending physician which shows: sinus rhythm, without ischemic changes   Medications: I ordered medication including IVF, dilaudid, zofran, augmentin.  I have reviewed the patients home medicines and have made adjustments as needed.  Consultations obtained: I consulted with infectious disease specialist Dr Drue Second who recommended continuing vancomycin, adding augmentin, dressing wound with medihoney. Recommended follow up with outpatient wound care.    Disposition: After consideration of the diagnostic results and the patients response to treatment, I feel  that emergency department workup does not suggest an emergent condition requiring admission or immediate intervention beyond what has been performed at this time. The plan is: discharge to home with antibiotics and wound care instructions. Referral sent for wound care, and follow up information given to the patient. Not septic. CT imaging reassuring. Wound is open and actively draining, not requiring I&D at this time. The  patient is safe for discharge and has been instructed to return immediately for worsening symptoms, change in symptoms or any other concerns.  I discussed this case with my attending physician Dr. Estell Harpin who cosigned this note including patient's presenting symptoms, physical exam, and planned diagnostics and interventions. Attending physician stated agreement with plan or made changes to plan which were implemented.   Final Clinical Impression(s) / ED Diagnoses Final diagnoses:  None    Rx / DC Orders ED Discharge Orders     None      Portions of this report may have been transcribed using voice recognition software. Every effort was made to ensure accuracy; however, inadvertent computerized transcription errors may be present.    Jeanella Flattery 01/06/23 1754    Bethann Berkshire, MD 01/10/23 1110

## 2023-01-06 NOTE — ED Triage Notes (Signed)
Pt BIB [facility] for pain to wound, pt is being treated with antibiotics for an infection.  Pt reports nausea and emesis.

## 2023-01-08 LAB — CULTURE, BLOOD (ROUTINE X 2): Special Requests: ADEQUATE

## 2023-01-08 LAB — AEROBIC CULTURE W GRAM STAIN (SUPERFICIAL SPECIMEN)

## 2023-01-09 LAB — AEROBIC CULTURE W GRAM STAIN (SUPERFICIAL SPECIMEN)

## 2023-01-09 LAB — CULTURE, BLOOD (ROUTINE X 2): Culture: NO GROWTH

## 2023-01-10 LAB — AEROBIC CULTURE W GRAM STAIN (SUPERFICIAL SPECIMEN)

## 2023-01-10 LAB — CULTURE, BLOOD (ROUTINE X 2)

## 2023-01-11 ENCOUNTER — Telehealth (HOSPITAL_BASED_OUTPATIENT_CLINIC_OR_DEPARTMENT_OTHER): Payer: Self-pay

## 2023-01-11 LAB — CULTURE, BLOOD (ROUTINE X 2)

## 2023-01-11 NOTE — Telephone Encounter (Signed)
Post ED Visit - Positive Culture Follow-up  Culture report reviewed by antimicrobial stewardship pharmacist: [facility] [x]  Estill Batten, Pharm.D. BCCCP []  Celedonio Miyamoto, Pharm.D., BCPS AQ-ID []  Garvin Fila, Pharm.D., BCPS []  Georgina Pillion, [address].D., BCPS []  [place], [address].D., BCPS, AAHIVP []  Estella Husk, Pharm.D., BCPS, AAHIVP []  Lysle Pearl, PharmD, BCPS []  Phillips Climes, PharmD, BCPS []  Agapito Games, PharmD, BCPS []  Verlan Friends, PharmD []  Mervyn Gay, PharmD, BCPS []  Vinnie Level, PharmD  [facility] []  Len Childs, PharmD []  Greer Pickerel, PharmD []  Adalberto Cole, PharmD []  Perlie Gold, Rph []  Lonell Face) Jean Rosenthal, PharmD []  Earl Many, PharmD []  Junita Push, PharmD []  Dorna Leitz, PharmD []  Terrilee Files, PharmD []  Lynann Beaver, PharmD []  Keturah Barre, PharmD []  Loralee Pacas, PharmD []  Bernadene Person, PharmD   Positive wound culture Treated with Amoxicillin, Vanco, organism sensitive to the same and no further patient follow-up is required at this time.  Sandria Senter 01/11/2023, 11:50 AM

## 2023-01-16 ENCOUNTER — Ambulatory Visit (HOSPITAL_COMMUNITY): Payer: Medicaid Other | Attending: Nurse Practitioner | Admitting: Physical Therapy

## 2023-01-16 DIAGNOSIS — R1030 Lower abdominal pain, unspecified: Secondary | ICD-10-CM | POA: Insufficient documentation

## 2023-01-16 DIAGNOSIS — S31109S Unspecified open wound of abdominal wall, unspecified quadrant without penetration into peritoneal cavity, sequela: Secondary | ICD-10-CM | POA: Diagnosis present

## 2023-01-16 DIAGNOSIS — L089 Local infection of the skin and subcutaneous tissue, unspecified: Secondary | ICD-10-CM | POA: Diagnosis present

## 2023-01-16 NOTE — Therapy (Signed)
OUTPATIENT PHYSICAL THERAPY NEURO EVALUATION   Patient Name: Martha [name] MRN: [id number] DOB:20-Sep-1965, 57 y.o., female Today's Date: 01/16/2023   PCP: [facility] REFERRING PROVIDER: Hyler, Janine Limbo, NP  END OF SESSION:  PT End of Session - 01/16/23 1551     Visit Number 1    Number of Visits 12    Date for PT Re-Evaluation 02/27/23    Authorization Type medicaid auth put in for 3 visits    Progress Note Due on Visit 4    PT Start Time 1515    PT Stop Time 1550    PT Time Calculation (min) 35 min    Activity Tolerance Patient tolerated treatment well    Behavior During Therapy WFL for tasks assessed/performed             Past Medical History:  Diagnosis Date   Acid reflux    takes Tums   Anemia    Arthritis    Bipolar 1 disorder (HCC)    Blindness of left eye    left eye removed   Carpal tunnel syndrome, bilateral    Cervical radiculopathy    CHF (congestive heart failure) (HCC)    Chronic kidney disease    Stage 5- 01/25/17   Constipation    COPD (chronic obstructive pulmonary disease) (HCC) 2014   bronchitis   Degenerative disc disease, thoracic    Depression    Diabetes mellitus    Type II   Diabetic retinopathy (HCC)    Dyspnea    when walking   End stage kidney disease (HCC)    M, W, F [facility]   Gout    Hypertension    Non-alcoholic cirrhosis (HCC)    Noncompliance with medication regimen    Noncompliance with medication regimen    Obesity (BMI 30-39.9)    OSA (obstructive sleep apnea)    cpap   Panic attack    RLS (restless legs syndrome)    Past Surgical History:  Procedure Laterality Date   AORTIC ARCH ANGIOGRAPHY N/A 09/11/2020   Procedure: AORTIC ARCH ANGIOGRAPHY;  Surgeon: Nada Libman, MD;  Location: [facility];  Service: Cardiovascular;  Laterality: N/A;   AV FISTULA PLACEMENT Right 01/27/2017   Procedure: ARTERIOVENOUS (AV) FISTULA CREATION-RIGHT ARM;  Surgeon: Sherren Kerns, MD;  Location: [facility] OR;   Service: Vascular;  Laterality: Right;   AV FISTULA PLACEMENT Left 08/03/2018   Procedure: LEFT ARTERIOVENOUS (AV) FISTULA CREATION;  Surgeon: Sherren Kerns, MD;  Location: [facility] OR;  Service: Vascular;  Laterality: Left;   BASCILIC VEIN TRANSPOSITION Left 04/26/2019   Procedure: SECOND STAGE BASILIC VEIN TRANSPOSITION LEFT ARM;  Surgeon: Sherren Kerns, MD;  Location: [name] OR;  Service: Vascular;  Laterality: Left;   BIOPSY N/A 05/17/2013   Procedure: BIOPSY;  Surgeon: West Bali, MD;  Location: [facility]S;  Service: Endoscopy;  Laterality: N/A;   CARPAL TUNNEL RELEASE Left 11/25/2018   Procedure: LEFT CARPAL TUNNEL RELEASE;  Surgeon: Dairl Ponder, MD;  Location: [facility] OR;  Service: Orthopedics;  Laterality: Left;   CATARACT EXTRACTION Right    CHOLECYSTECTOMY N/A 04/27/2014   Procedure: LAPAROSCOPIC CHOLECYSTECTOMY;  Surgeon: Marlane Hatcher, MD;  Location: [facility];  Service: General;  Laterality: N/A;   COLONOSCOPY  06/06/2010   [id number]:[id number] bleeding secondary to internal hemorrhoids but incomplete evaluation secondary to poor right colon prep/small rectal and sigmoid colon polyps (hyperplastic). PROPOFOL   COLONOSCOPY  May 2013   Dr. Gilliam/[facility]: 5 mm a descending  colon polyp, hyperplastic. Adequate bowel prep.   ENDOMETRIAL ABLATION     ENUCLEATION Left 02/04/2018   ENUCLEATION WITH PLACEMENT OF IMPLANT LEFT EYE   ENUCLEATION Left 02/04/2018   Procedure: ENUCLEATION WITH PLACEMENT OF IMPLANT LEFT EYE;  Surgeon: Floydene Flock, MD;  Location: [facility] OR;  Service: Ophthalmology;  Laterality: Left;   ESOPHAGOGASTRODUODENOSCOPY  06/06/2010   BJY:NWGNFAO erythema and edema of body of stomach, with sessile polypoid lesions. bx benign. no h.pylori   ESOPHAGOGASTRODUODENOSCOPY (EGD) WITH PROPOFOL N/A 05/17/2013   Dr. Darrick Penna: normal esophagus, moderate nodular gastritis, negative path, empiric Savary dilation   EYE SURGERY  07/2017   sx for glaucoma   FLEXIBLE SIGMOIDOSCOPY N/A 05/17/2013    Dr. Darrick Penna: moderate sized internal hemorrhoids   HEMORRHOID BANDING N/A 05/17/2013   Procedure: HEMORRHOID BANDING;  Surgeon: West Bali, MD;  Location: [facility];  Service: Endoscopy;  Laterality: N/A;  2 bands placed   INCISION AND DRAINAGE ABSCESS Left 10/11/2013   Procedure: INCISION AND DRAINAGE AND DEBRIDEMENT LEFT BREAST  ABSCESS;  Surgeon: Marlane Hatcher, MD;  Location: [facility];  Service: General;  Laterality: Left;   INSERTION OF DIALYSIS CATHETER N/A 06/08/2018   Procedure: INSERTION OF Right internal jugular TUNNELED  DIALYSIS CATHETER;  Surgeon: Chuck Hint, MD;  Location: [facility] OR;  Service: Vascular;  Laterality: N/A;   IRRIGATION AND DEBRIDEMENT ABSCESS Right 06/01/2013   Procedure: INCISION AND DRAINAGE AND DEBRIDEMENT ABSCESS RIGHT BREAST;  Surgeon: Marlane Hatcher, MD;  Location: [facility];  Service: General;  Laterality: Right;   LEFT EYE REMOVED Left 01/2018   [facility] [address].   LIGATION OF ARTERIOVENOUS  FISTULA Right 06/08/2018   Procedure: LIGATION OF ARTERIOVENOUS  FISTULA RIGHT ARM;  Surgeon: Chuck Hint, MD;  Location: [facility] OR;  Service: Vascular;  Laterality: Right;   ORIF ANKLE FRACTURE Right 12/17/2018   Procedure: OPEN REDUCTION INTERNAL FIXATION (ORIF) ANKLE FRACTURE;  Surgeon: Felecia Shelling, DPM;  Location: [facility] OR;  Service: Podiatry;  Laterality: Right;   REVISON OF ARTERIOVENOUS FISTULA Left 09/26/2020   Procedure: LEFT ARTERIOVENOUS FISTULA  DISTAL REVASCULARIZATION AND LIGATION ( DRIL ) USING GREATER SAPHENOUS VEIN;  Surgeon: Maeola Harman, MD;  Location: [facility] OR;  Service: Vascular;  Laterality: Left;   SAVORY DILATION N/A 05/17/2013   Procedure: SAVORY DILATION;  Surgeon: West Bali, MD;  Location: [facility];  Service: Endoscopy;  Laterality: N/A;  14/15/16   SKIN FULL THICKNESS GRAFT Left 05/05/2018   Procedure: ABDOMINAL DERMIS FAT SKIN GRAFT FULL THICKNESS LEFT EYE;  Surgeon: Floydene Flock, MD;   Location: [facility] OR;  Service: Ophthalmology;  Laterality: Left;   TUBAL LIGATION     VEIN HARVEST Left 09/26/2020   Procedure: LEFT LEG GREATER SAPHENOUS VEIN HARVEST;  Surgeon: Maeola Harman, MD;  Location: [facility] OR;  Service: Vascular;  Laterality: Left;   Patient Active Problem List   Diagnosis Date Noted   Opioid dependence (HCC) 10/18/2021   Cellulitis and abscess of hand 10/18/2021   Left hand pain 10/17/2021   Cellulitis of left hand 10/17/2021   ESRD (end stage renal disease) (HCC) 09/26/2020   Hypoalbuminemia 07/06/2020   Acute respiratory failure with hypoxia (HCC) 07/06/2020   CAP (community acquired pneumonia) 07/05/2020   Trimalleolar fracture of ankle, closed, right, initial encounter 12/17/2018   Pain and swelling of ankle, right 12/16/2018   Metabolic encephalopathy 09/26/2018   SIRS (systemic inflammatory response syndrome) (HCC) 06/23/2018   UTI (urinary tract infection)  02/26/2018   Volume overload 02/25/2018   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   Dialysis patient (HCC) 12/13/2017   ESRD (end stage renal disease) on dialysis (HCC)    Chronic diarrhea 10/08/2017   Anemia in chronic kidney disease 05/24/2017   Chronic obstructive pulmonary disease (HCC) 05/24/2017   Heart failure (HCC) 05/24/2017   Osteopathy in diseases classified elsewhere, unspecified site 05/24/2017   Renal failure (ARF), acute on chronic (HCC) 04/22/2017   Anasarca associated with disorder of kidney 04/20/2017   Protein-calorie malnutrition, severe 11/29/2016   HTN (hypertension), malignant 11/20/2016   Bipolar 1 disorder (HCC) 11/20/2016   Noncompliance with medication regimen 11/20/2016   Panic attack 11/20/2016   Controlled type 2 diabetes mellitus without complication, without long-term current use of insulin (HCC) 10/27/2016   Sensory disturbance 10/27/2016   Left hemiparesis (HCC) 10/27/2016   Cholelithiasis 04/26/2014   Hyponatremia 04/26/2014   Gallstones  04/06/2014   Diabetic ulcer of left great toe (HCC) 10/09/2013   Type 2 diabetes mellitus treated with insulin (HCC) 05/30/2013   Hyperglycemia due to diabetes mellitus (HCC) 05/30/2013   Internal hemorrhoids with other complication 04/28/2013   Umbilical hernia without mention of obstruction or gangrene 04/15/2013   Anemia 04/15/2013   Obesity, Class III, BMI 40-49.9 (morbid obesity) (HCC) 10/08/2012   DM 05/13/2010   Hyperlipidemia 05/13/2010   Morbid obesity (HCC) 05/13/2010   Depression with anxiety 05/13/2010   RESTLESS LEG SYNDROME 05/13/2010   Essential hypertension 05/13/2010   GERD 05/13/2010   RECTAL BLEEDING 05/13/2010   OSA on CPAP 05/13/2010   Dysphagia, oropharyngeal phase 05/13/2010    ONSET DATE: 11/25/2022  REFERRING DIAG: abdominal wound   THERAPY DIAG:  Chronic abdominal wound infection, sequela  Lower abdominal pain  Rationale for Evaluation and Treatment: Rehabilitation     Wound Therapy - 01/16/23 0001     Subjective Pt states that she has had her wound for over a month.  The pain has been high.  She states it started out as bumps which progressed to a small wound which grew rapidly    Patient and Family Stated Goals wound to be healed    Date of Onset 11/25/22    Pain Scale 0-10    Pain Score 7     Pain Type Chronic pain    Pain Location Abdomen    Pain Orientation Lower    Pain Descriptors / Indicators Aching    Pain Onset On-going    Patients Stated Pain Goal 2    Pain Intervention(s) Emotional support    Evaluation and Treatment Procedures Explained to Patient/Family Yes    Evaluation and Treatment Procedures agreed to    Wound Properties Date First Assessed: 01/16/23 Time First Assessed: 1530 Location: Abdomen Location Orientation: Lower;Medial;Right Wound Description (Comments): Rt lower abdominal Present on Admission: Yes   Dressing Type Gauze (Comment)    Dressing Changed Changed    Dressing Status Old drainage;New drainage    Dressing  Change Frequency PRN    Site / Wound Assessment Pink;Yellow    % Wound base Red or Granulating 70%   following debridement   % Wound base Yellow/Fibrinous Exudate 30%    Peri-wound Assessment Edema;Induration   hardening goes to the left 11 cm, Rt 4, superior 4   Wound Length (cm) 3.2 cm    Wound Width (cm) 4.1 cm    Wound Depth (cm) --   at least 2 please measure next treatment   Wound Surface Area (cm^2) 13.12  cm^2    Drainage Amount Moderate    Drainage Description Serous    Treatment Cleansed;Debridement (Selective);Hydrotherapy (Pulse lavage);Other (Comment)    Pulsed lavage therapy - wound location wound bed    Pulsed Lavage with Suction (psi) 200 psi    Pulsed Lavage with Suction - Normal Saline Used 1000 mL    Pulsed Lavage Tip Tip with splash shield    Selective Debridement (non-excisional) - Location wound bed    Selective Debridement (non-excisional) - Tools Used Forceps;Scissors    Selective Debridement (non-excisional) - Tissue Removed devitalized tissue    Wound Therapy - Clinical Statement see below    Wound Therapy - Functional Problem List difficulty with dressing , cleansing    Factors Delaying/Impairing Wound Healing Altered sensation;Diabetes Mellitus;Infection - systemic/local;Polypharmacy;Vascular compromise    Hydrotherapy Plan Debridement;Dressing change;Patient/family education    Wound Therapy - Frequency 2X / week   for 6 weeks   Wound Therapy - Current Recommendations PT    Wound Plan see TIW x 6 weeks to ensure proper healing is occuring    Dressing  silver hydrofiber, 4x4 followed by abpad and medipore tape.               PATIENT EDUCATION: Education details: self massage to indurated area surrounding the wound, the importance of keeping her sugar level near 100, eat extra protein, keep dressing dry.  Person educated: Patient and Caregiver   Education method: Explanation Education comprehension: verbalized understanding   HOME EXERCISE  PROGRAM: Self manual   GOALS: Goals reviewed with patient? No  SHORT TERM GOALS: Target date: 01/30/2023  Wound to be 100% granulated Baseline: Goal status: INITIAL  2.  Pain to be no greater than 5/10 Baseline:  Goal status: INITIAL   LONG TERM GOALS: Target date: 02/27/2023  Wound size to be no more than 2x2 cm  Baseline:  Goal status: INITIAL  2.  Wound to have  no undermining Baseline:  Goal status: INITIAL  3.  Depth to be no greater than 1 cm  Baseline:  Goal status: INITIAL  4.  Pain to be no greater than a 2/10 Baseline:  Goal status: INITIAL  5.  Aide to be confident in continued care of wound  Baseline:  Goal status: INITIAL    ASSESSMENT:  CLINICAL IMPRESSION: Patient is a 57 y.o. female who was seen today for physical therapy evaluation and treatment for  Diagnosis Description  non healing wound abdominal wound  PMH affecting wound healing includes Mhistory of insulin-dependent type 2 diabetes, bipolar 1 disorder, COPD, ESRD on hemodialysis TTS, nonalcoholic cirrhosis on dialysis, sleep apnea on CPAP, hypertension, restless legs .  Pt has had a progressive abdominal wound and is now being referred to skilled PT for treatment.  Ms. Gaona will benefit from skilled PT to ensure proper wound healing is occurring as the pt is legally blind.    OBJECTIVE IMPAIRMENTS: obesity, pain, and decreased skin integrity .   ACTIVITY LIMITATIONS: dressing and hygiene/grooming  REHAB POTENTIAL: Good  CLINICAL DECISION MAKING: Stable/uncomplicated  EVALUATION COMPLEXITY: Moderate  PLAN: PT FREQUENCY: 2x/week  PT DURATION: 6 weeks  PLANNED INTERVENTIONS: Patient/Family education, Self Care, Manual therapy, and debridement and dressing change  PLAN FOR NEXT SESSION: Please measure depth and take photo as this was not done on this visit.  Continue with debridement and dressing change.  Wound most likely will only need pulse lavage for a few more visits.  Attempt  to get into undermining area.   Virgina Organ,  PT CLT [phone]  01/16/2023, 4:14 PM

## 2023-01-19 ENCOUNTER — Ambulatory Visit (HOSPITAL_COMMUNITY): Payer: Medicaid Other | Admitting: Physical Therapy

## 2023-01-19 DIAGNOSIS — L089 Local infection of the skin and subcutaneous tissue, unspecified: Secondary | ICD-10-CM

## 2023-01-19 DIAGNOSIS — R1030 Lower abdominal pain, unspecified: Secondary | ICD-10-CM

## 2023-01-19 DIAGNOSIS — S31109S Unspecified open wound of abdominal wall, unspecified quadrant without penetration into peritoneal cavity, sequela: Secondary | ICD-10-CM | POA: Diagnosis not present

## 2023-01-19 NOTE — Therapy (Signed)
OUTPATIENT PHYSICAL THERAPY Wound Treatment   Patient Name: Martha [name] MRN: [id number] DOB:[birth year], 57 y.o., female Today's Date: 01/19/2023   PCP: [facility] REFERRING PROVIDER: Hyler, Janine Limbo, NP  END OF SESSION:  PT End of Session - 01/19/23 1432     Visit Number 2    Number of Visits 12    Date for PT Re-Evaluation 02/27/23    Authorization Type medicaid auth put in for 3 visits    Progress Note Due on Visit 4    PT Start Time 1356    PT Stop Time 1420    PT Time Calculation (min) 24 min    Activity Tolerance Patient tolerated treatment well    Behavior During Therapy WFL for tasks assessed/performed             Past Medical History:  Diagnosis Date   Acid reflux    takes Tums   Anemia    Arthritis    Bipolar 1 disorder (HCC)    Blindness of left eye    left eye removed   Carpal tunnel syndrome, bilateral    Cervical radiculopathy    CHF (congestive heart failure) (HCC)    Chronic kidney disease    Stage 5- 01/25/17   Constipation    COPD (chronic obstructive pulmonary disease) (HCC) 2014   bronchitis   Degenerative disc disease, thoracic    Depression    Diabetes mellitus    Type II   Diabetic retinopathy (HCC)    Dyspnea    when walking   End stage kidney disease (HCC)    M, W, F [facility]   Gout    Hypertension    Non-alcoholic cirrhosis (HCC)    Noncompliance with medication regimen    Noncompliance with medication regimen    Obesity (BMI 30-39.9)    OSA (obstructive sleep apnea)    cpap   Panic attack    RLS (restless legs syndrome)    Past Surgical History:  Procedure Laterality Date   AORTIC ARCH ANGIOGRAPHY N/A 09/11/2020   Procedure: AORTIC ARCH ANGIOGRAPHY;  Surgeon: Nada Libman, MD;  Location: [facility];  Service: Cardiovascular;  Laterality: N/A;   AV FISTULA PLACEMENT Right 01/27/2017   Procedure: ARTERIOVENOUS (AV) FISTULA CREATION-RIGHT ARM;  Surgeon: Sherren Kerns, MD;  Location: [facility] OR;  Service:  Vascular;  Laterality: Right;   AV FISTULA PLACEMENT Left 08/03/2018   Procedure: LEFT ARTERIOVENOUS (AV) FISTULA CREATION;  Surgeon: Sherren Kerns, MD;  Location: [facility] OR;  Service: Vascular;  Laterality: Left;   BASCILIC VEIN TRANSPOSITION Left 04/26/2019   Procedure: SECOND STAGE BASILIC VEIN TRANSPOSITION LEFT ARM;  Surgeon: Sherren Kerns, MD;  Location: [facility] OR;  Service: Vascular;  Laterality: Left;   BIOPSY N/A 05/17/2013   Procedure: BIOPSY;  Surgeon: West Bali, MD;  Location: [facility] ORS;  Service: Endoscopy;  Laterality: N/A;   CARPAL TUNNEL RELEASE Left 11/25/2018   Procedure: LEFT CARPAL TUNNEL RELEASE;  Surgeon: Dairl Ponder, MD;  Location: [facility] OR;  Service: Orthopedics;  Laterality: Left;   CATARACT EXTRACTION Right    CHOLECYSTECTOMY N/A 04/27/2014   Procedure: LAPAROSCOPIC CHOLECYSTECTOMY;  Surgeon: Marlane Hatcher, MD;  Location: [facility];  Service: General;  Laterality: N/A;   COLONOSCOPY  06/06/2010   WUJ:WJXBJY bleeding secondary to internal hemorrhoids but incomplete evaluation secondary to poor right colon prep/small rectal and sigmoid colon polyps (hyperplastic). PROPOFOL   COLONOSCOPY  May 2013   Dr. Gilliam/[facility]: 5 mm a descending  colon polyp, hyperplastic. Adequate bowel prep.   ENDOMETRIAL ABLATION     ENUCLEATION Left 02/04/2018   ENUCLEATION WITH PLACEMENT OF IMPLANT LEFT EYE   ENUCLEATION Left 02/04/2018   Procedure: ENUCLEATION WITH PLACEMENT OF IMPLANT LEFT EYE;  Surgeon: Floydene Flock, MD;  Location: [facility] OR;  Service: Ophthalmology;  Laterality: Left;   ESOPHAGOGASTRODUODENOSCOPY  06/06/2010   ZOX:WRUEAVW erythema and edema of body of stomach, with sessile polypoid lesions. bx benign. no h.pylori   ESOPHAGOGASTRODUODENOSCOPY (EGD) WITH PROPOFOL N/A 05/17/2013   Dr. Darrick Penna: normal esophagus, moderate nodular gastritis, negative path, empiric Savary dilation   EYE SURGERY  07/2017   sx for glaucoma   FLEXIBLE SIGMOIDOSCOPY N/A 05/17/2013   Dr. Darrick Penna:  moderate sized internal hemorrhoids   HEMORRHOID BANDING N/A 05/17/2013   Procedure: HEMORRHOID BANDING;  Surgeon: West Bali, MD;  Location: [facility];  Service: Endoscopy;  Laterality: N/A;  2 bands placed   INCISION AND DRAINAGE ABSCESS Left 10/11/2013   Procedure: INCISION AND DRAINAGE AND DEBRIDEMENT LEFT BREAST  ABSCESS;  Surgeon: Marlane Hatcher, MD;  Location: [facility];  Service: General;  Laterality: Left;   INSERTION OF DIALYSIS CATHETER N/A 06/08/2018   Procedure: INSERTION OF Right internal jugular TUNNELED  DIALYSIS CATHETER;  Surgeon: Chuck Hint, MD;  Location: [facility] OR;  Service: Vascular;  Laterality: N/A;   IRRIGATION AND DEBRIDEMENT ABSCESS Right 06/01/2013   Procedure: INCISION AND DRAINAGE AND DEBRIDEMENT ABSCESS RIGHT BREAST;  Surgeon: Marlane Hatcher, MD;  Location: [facility];  Service: General;  Laterality: Right;   LEFT EYE REMOVED Left 01/2018   [facility] [address].   LIGATION OF ARTERIOVENOUS  FISTULA Right 06/08/2018   Procedure: LIGATION OF ARTERIOVENOUS  FISTULA RIGHT ARM;  Surgeon: Chuck Hint, MD;  Location: [facility] OR;  Service: Vascular;  Laterality: Right;   ORIF ANKLE FRACTURE Right 12/17/2018   Procedure: OPEN REDUCTION INTERNAL FIXATION (ORIF) ANKLE FRACTURE;  Surgeon: Felecia Shelling, DPM;  Location: [facility] OR;  Service: Podiatry;  Laterality: Right;   REVISON OF ARTERIOVENOUS FISTULA Left 09/26/2020   Procedure: LEFT ARTERIOVENOUS FISTULA  DISTAL REVASCULARIZATION AND LIGATION ( DRIL ) USING GREATER SAPHENOUS VEIN;  Surgeon: Maeola Harman, MD;  Location: [facility] OR;  Service: Vascular;  Laterality: Left;   SAVORY DILATION N/A 05/17/2013   Procedure: SAVORY DILATION;  Surgeon: West Bali, MD;  Location: [facility];  Service: Endoscopy;  Laterality: N/A;  14/15/16   SKIN FULL THICKNESS GRAFT Left 05/05/2018   Procedure: ABDOMINAL DERMIS FAT SKIN GRAFT FULL THICKNESS LEFT EYE;  Surgeon: Floydene Flock, MD;  Location: [facility] OR;   Service: Ophthalmology;  Laterality: Left;   TUBAL LIGATION     VEIN HARVEST Left 09/26/2020   Procedure: LEFT LEG GREATER SAPHENOUS VEIN HARVEST;  Surgeon: Maeola Harman, MD;  Location: [facility] OR;  Service: Vascular;  Laterality: Left;   Patient Active Problem List   Diagnosis Date Noted   Opioid dependence (HCC) 10/18/2021   Cellulitis and abscess of hand 10/18/2021   Left hand pain 10/17/2021   Cellulitis of left hand 10/17/2021   ESRD (end stage renal disease) (HCC) 09/26/2020   Hypoalbuminemia 07/06/2020   Acute respiratory failure with hypoxia (HCC) 07/06/2020   CAP (community acquired pneumonia) 07/05/2020   Trimalleolar fracture of ankle, closed, right, initial encounter 12/17/2018   Pain and swelling of ankle, right 12/16/2018   Metabolic encephalopathy 09/26/2018   SIRS (systemic inflammatory response syndrome) (HCC) 06/23/2018   UTI (urinary tract infection)  02/26/2018   Volume overload 02/25/2018   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   Dialysis patient (HCC) 12/13/2017   ESRD (end stage renal disease) on dialysis (HCC)    Chronic diarrhea 10/08/2017   Anemia in chronic kidney disease 05/24/2017   Chronic obstructive pulmonary disease (HCC) 05/24/2017   Heart failure (HCC) 05/24/2017   Osteopathy in diseases classified elsewhere, unspecified site 05/24/2017   Renal failure (ARF), acute on chronic (HCC) 04/22/2017   Anasarca associated with disorder of kidney 04/20/2017   Protein-calorie malnutrition, severe 11/29/2016   HTN (hypertension), malignant 11/20/2016   Bipolar 1 disorder (HCC) 11/20/2016   Noncompliance with medication regimen 11/20/2016   Panic attack 11/20/2016   Controlled type 2 diabetes mellitus without complication, without long-term current use of insulin (HCC) 10/27/2016   Sensory disturbance 10/27/2016   Left hemiparesis (HCC) 10/27/2016   Cholelithiasis 04/26/2014   Hyponatremia 04/26/2014   Gallstones 04/06/2014   Diabetic  ulcer of left great toe (HCC) 10/09/2013   Type 2 diabetes mellitus treated with insulin (HCC) 05/30/2013   Hyperglycemia due to diabetes mellitus (HCC) 05/30/2013   Internal hemorrhoids with other complication 04/28/2013   Umbilical hernia without mention of obstruction or gangrene 04/15/2013   Anemia 04/15/2013   Obesity, Class III, BMI 40-49.9 (morbid obesity) (HCC) 10/08/2012   DM 05/13/2010   Hyperlipidemia 05/13/2010   Morbid obesity (HCC) 05/13/2010   Depression with anxiety 05/13/2010   RESTLESS LEG SYNDROME 05/13/2010   Essential hypertension 05/13/2010   GERD 05/13/2010   RECTAL BLEEDING 05/13/2010   OSA on CPAP 05/13/2010   Dysphagia, oropharyngeal phase 05/13/2010    ONSET DATE: 11/25/2022  REFERRING DIAG: abdominal wound   THERAPY DIAG:  Chronic abdominal wound infection, sequela  Lower abdominal pain  Rationale for Evaluation and Treatment: Rehabilitation     Wound Therapy - 01/19/23 1433     Subjective pt states it is hard not being able to take a shower.  Dressing is still intact.    Patient and Family Stated Goals wound to be healed    Date of Onset 11/25/22    Pain Scale 0-10    Pain Score 5     Pain Type Chronic pain    Pain Location Abdomen    Pain Orientation Lower    Pain Descriptors / Indicators Aching    Evaluation and Treatment Procedures Explained to Patient/Family Yes    Evaluation and Treatment Procedures agreed to    Wound Properties Date First Assessed: 01/16/23 Time First Assessed: 1530 Location: Abdomen Location Orientation: Lower;Medial;Right Wound Description (Comments): Rt lower abdominal Present on Admission: Yes   Wound Image Images linked: 1    Dressing Type Silver hydrofiber;Gauze (Comment);Abdominal pads    Dressing Changed Changed    Dressing Status Old drainage;New drainage    Dressing Change Frequency PRN    Site / Wound Assessment Pink;Yellow    % Wound base Red or Granulating 75%    % Wound base Yellow/Fibrinous Exudate  25%    Peri-wound Assessment Edema;Induration   hardening goes to the left 11 cm, Rt 4, superior 4   Wound Length (cm) 2.5 cm    Wound Width (cm) 3.8 cm    Wound Depth (cm) 1.9 cm    Wound Volume (cm^3) 18.05 cm^3    Wound Surface Area (cm^2) 9.5 cm^2    Drainage Amount Moderate    Drainage Description Serosanguineous    Treatment Cleansed;Debridement (Selective)    Pulsed lavage therapy - wound location wound bed  Pulsed Lavage with Suction (psi) 10 psi    Pulsed Lavage with Suction - Normal Saline Used 1000 mL    Pulsed Lavage Tip Tip with splash shield    Selective Debridement (non-excisional) - Location wound bed    Selective Debridement (non-excisional) - Tools Used Forceps;Scissors    Selective Debridement (non-excisional) - Tissue Removed devitalized tissue    Wound Therapy - Clinical Statement see below    Wound Therapy - Functional Problem List difficulty with dressing , cleansing    Factors Delaying/Impairing Wound Healing Altered sensation;Diabetes Mellitus;Infection - systemic/local;Polypharmacy;Vascular compromise    Hydrotherapy Plan Debridement;Dressing change;Patient/family education    Wound Therapy - Frequency 2X / week   for 6 weeks   Wound Therapy - Current Recommendations PT    Wound Plan see TIW x 6 weeks to ensure proper healing is occuring    Dressing  silver hydrofiber, 4x4 followed by abpad and medipore tape.               PATIENT EDUCATION: Education details: self massage to indurated area surrounding the wound, the importance of keeping her sugar level near 100, eat extra protein, keep dressing dry.  Person educated: Patient and Caregiver   Education method: Explanation Education comprehension: verbalized understanding   HOME EXERCISE PROGRAM: Self manual   GOALS: Goals reviewed with patient? No  SHORT TERM GOALS: Target date: 01/30/2023  Wound to be 100% granulated Baseline: Goal status: INITIAL  2.  Pain to be no greater than  5/10 Baseline:  Goal status: INITIAL   LONG TERM GOALS: Target date: 02/27/2023  Wound size to be no more than 2x2 cm  Baseline:  Goal status: INITIAL  2.  Wound to have  no undermining Baseline:  Goal status: INITIAL  3.  Depth to be no greater than 1 cm  Baseline:  Goal status: INITIAL  4.  Pain to be no greater than a 2/10 Baseline:  Goal status: INITIAL  5.  Aide to be confident in continued care of wound  Baseline:  Goal status: INITIAL    ASSESSMENT:  CLINICAL IMPRESSION: Wound photographed and re-measured this session (see media).  Wound is already beginning to approximate and granulate in comparison to last weeks measurements.  Minimal debridement was needed today as mostly clean.  CG is concerned about 2 other spots on abdomen and these were also photographed in medial.  Most distal one is fully healed and proximal one at pant line is fully granulated.  Instructed to keep dressing over the most proximal one and therapist secured xeroform and 2X2 over this with medipore.  Educated on importance of continued sponge baths until wound is healed so no bacteria from skin can get into wound bed.  Pt verbalized understanding.   Patient is a 57 y.o. female who was seen today for physical therapy evaluation and treatment for  Diagnosis Description  non healing wound abdominal wound  PMH affecting wound healing includes Mhistory of insulin-dependent type 2 diabetes, bipolar 1 disorder, COPD, ESRD on hemodialysis TTS, nonalcoholic cirrhosis on dialysis, sleep apnea on CPAP, hypertension, restless legs .  Pt has had a progressive abdominal wound and is now being referred to skilled PT for treatment.  Ms. Weiskopf will benefit from skilled PT to ensure proper wound healing is occurring as the pt is legally blind.    OBJECTIVE IMPAIRMENTS: obesity, pain, and decreased skin integrity .   ACTIVITY LIMITATIONS: dressing and hygiene/grooming  REHAB POTENTIAL: Good  CLINICAL DECISION  MAKING: Stable/uncomplicated  EVALUATION  COMPLEXITY: Moderate  PLAN: PT FREQUENCY: 2x/week  PT DURATION: 6 weeks  PLANNED INTERVENTIONS: Patient/Family education, Self Care, Manual therapy, and debridement and dressing change  PLAN FOR NEXT SESSION:  Continue with debridement and dressing change.  Wound most likely will only need pulse lavage for a few more visits.  Attempt to get into undermining area.  *PT IS LEGALLY BLIND AND NEEDS NAVIGATIONAL ASSISTANCE TO GET TO WOUND ROOM*  Lurena Nida, PTA/CLT [facility] Ph: [phone]  01/19/2023, 2:38 PM

## 2023-01-21 ENCOUNTER — Ambulatory Visit (HOSPITAL_COMMUNITY): Payer: Medicaid Other | Admitting: Physical Therapy

## 2023-01-23 ENCOUNTER — Ambulatory Visit (HOSPITAL_COMMUNITY): Payer: Medicaid Other | Admitting: Physical Therapy

## 2023-01-26 ENCOUNTER — Ambulatory Visit (HOSPITAL_COMMUNITY): Payer: Medicaid Other | Admitting: Physical Therapy

## 2023-01-26 ENCOUNTER — Telehealth (HOSPITAL_COMMUNITY): Payer: Self-pay | Admitting: Physical Therapy

## 2023-01-26 ENCOUNTER — Ambulatory Visit (HOSPITAL_COMMUNITY): Payer: Medicaid Other | Attending: Nurse Practitioner | Admitting: Physical Therapy

## 2023-01-26 DIAGNOSIS — S31109S Unspecified open wound of abdominal wall, unspecified quadrant without penetration into peritoneal cavity, sequela: Secondary | ICD-10-CM | POA: Diagnosis present

## 2023-01-26 DIAGNOSIS — L089 Local infection of the skin and subcutaneous tissue, unspecified: Secondary | ICD-10-CM | POA: Diagnosis present

## 2023-01-26 DIAGNOSIS — R1030 Lower abdominal pain, unspecified: Secondary | ICD-10-CM | POA: Diagnosis present

## 2023-01-26 NOTE — Therapy (Signed)
OUTPATIENT PHYSICAL THERAPY Wound Treatment   Patient Name: Martha [name] MRN: [id number] DOB:03/29/1966, 57 y.o., female Today's Date: 01/26/2023   PCP: [facility] REFERRING PROVIDER: Hyler, Janine Limbo, NP  END OF SESSION:  PT End of Session - 01/26/23 1423     Visit Number 3    Number of Visits 12    Date for PT Re-Evaluation 02/27/23    Authorization Type medicaid 3 visits approved 6/24-7/7    Authorization - Visit Number 2    Authorization - Number of Visits 3    Progress Note Due on Visit --    PT Start Time 1425    PT Stop Time 1445    PT Time Calculation (min) 20 min    Activity Tolerance Patient tolerated treatment well    Behavior During Therapy WFL for tasks assessed/performed             Past Medical History:  Diagnosis Date   Acid reflux    takes Tums   Anemia    Arthritis    Bipolar 1 disorder (HCC)    Blindness of left eye    left eye removed   Carpal tunnel syndrome, bilateral    Cervical radiculopathy    CHF (congestive heart failure) (HCC)    Chronic kidney disease    Stage 5- 01/25/17   Constipation    COPD (chronic obstructive pulmonary disease) (HCC) 2014   bronchitis   Degenerative disc disease, thoracic    Depression    Diabetes mellitus    Type II   Diabetic retinopathy (HCC)    Dyspnea    when walking   End stage kidney disease (HCC)    M, W, F [facility]   Gout    Hypertension    Non-alcoholic cirrhosis (HCC)    Noncompliance with medication regimen    Noncompliance with medication regimen    Obesity (BMI 30-39.9)    OSA (obstructive sleep apnea)    cpap   Panic attack    RLS (restless legs syndrome)    Past Surgical History:  Procedure Laterality Date   AORTIC ARCH ANGIOGRAPHY N/A 09/11/2020   Procedure: AORTIC ARCH ANGIOGRAPHY;  Surgeon: Nada Libman, MD;  Location: [facility];  Service: Cardiovascular;  Laterality: N/A;   AV FISTULA PLACEMENT Right 01/27/2017   Procedure: ARTERIOVENOUS (AV) FISTULA  CREATION-RIGHT ARM;  Surgeon: Sherren Kerns, MD;  Location: [facility] OR;  Service: Vascular;  Laterality: Right;   AV FISTULA PLACEMENT Left 08/03/2018   Procedure: LEFT ARTERIOVENOUS (AV) FISTULA CREATION;  Surgeon: Sherren Kerns, MD;  Location: [facility] OR;  Service: Vascular;  Laterality: Left;   BASCILIC VEIN TRANSPOSITION Left 04/26/2019   Procedure: SECOND STAGE BASILIC VEIN TRANSPOSITION LEFT ARM;  Surgeon: Sherren Kerns, MD;  Location: [facility] OR;  Service: Vascular;  Laterality: Left;   BIOPSY N/A 05/17/2013   Procedure: BIOPSY;  Surgeon: West Bali, MD;  Location: [facility] ORS;  Service: Endoscopy;  Laterality: N/A;   CARPAL TUNNEL RELEASE Left 11/25/2018   Procedure: LEFT CARPAL TUNNEL RELEASE;  Surgeon: Dairl Ponder, MD;  Location: [facility] OR;  Service: Orthopedics;  Laterality: Left;   CATARACT EXTRACTION Right    CHOLECYSTECTOMY N/A 04/27/2014   Procedure: LAPAROSCOPIC CHOLECYSTECTOMY;  Surgeon: Marlane Hatcher, MD;  Location: [facility];  Service: General;  Laterality: N/A;   COLONOSCOPY  06/06/2010   WUJ:WJXBJY bleeding secondary to internal hemorrhoids but incomplete evaluation secondary to poor right colon prep/small rectal and sigmoid colon polyps (hyperplastic).  PROPOFOL   COLONOSCOPY  May 2013   Dr. Gilliam/[facility]: 5 mm a descending colon polyp, hyperplastic. Adequate bowel prep.   ENDOMETRIAL ABLATION     ENUCLEATION Left 02/04/2018   ENUCLEATION WITH PLACEMENT OF IMPLANT LEFT EYE   ENUCLEATION Left 02/04/2018   Procedure: ENUCLEATION WITH PLACEMENT OF IMPLANT LEFT EYE;  Surgeon: Floydene Flock, MD;  Location: [facility] OR;  Service: Ophthalmology;  Laterality: Left;   ESOPHAGOGASTRODUODENOSCOPY  06/06/2010   ZOX:WRUEAVW erythema and edema of body of stomach, with sessile polypoid lesions. bx benign. no h.pylori   ESOPHAGOGASTRODUODENOSCOPY (EGD) WITH PROPOFOL N/A 05/17/2013   Dr. Darrick Penna: normal esophagus, moderate nodular gastritis, negative path, empiric Savary dilation   EYE SURGERY   07/2017   sx for glaucoma   FLEXIBLE SIGMOIDOSCOPY N/A 05/17/2013   Dr. Darrick Penna: moderate sized internal hemorrhoids   HEMORRHOID BANDING N/A 05/17/2013   Procedure: HEMORRHOID BANDING;  Surgeon: West Bali, MD;  Location: [facility];  Service: Endoscopy;  Laterality: N/A;  2 bands placed   INCISION AND DRAINAGE ABSCESS Left 10/11/2013   Procedure: INCISION AND DRAINAGE AND DEBRIDEMENT LEFT BREAST  ABSCESS;  Surgeon: Marlane Hatcher, MD;  Location: [facility];  Service: General;  Laterality: Left;   INSERTION OF DIALYSIS CATHETER N/A 06/08/2018   Procedure: INSERTION OF Right internal jugular TUNNELED  DIALYSIS CATHETER;  Surgeon: Chuck Hint, MD;  Location[facility] OR;  Service: Vascular;  Laterality: N/A;   IRRIGATION AND DEBRIDEMENT ABSCESS Right 06/01/2013   Procedure: INCISION AND DRAINAGE AND DEBRIDEMENT ABSCESS RIGHT BREAST;  Surgeon: Marlane Hatcher, MD;  Location: [facility];  Service: General;  Laterality: Right;   LEFT EYE REMOVED Left 01/2018   [facility] on [address].   LIGATION OF ARTERIOVENOUS  FISTULA Right 06/08/2018   Procedure: LIGATION OF ARTERIOVENOUS  FISTULA RIGHT ARM;  Surgeon: Chuck Hint, MD;  Location: [facility] OR;  Service: Vascular;  Laterality: Right;   ORIF ANKLE FRACTURE Right 12/17/2018   Procedure: OPEN REDUCTION INTERNAL FIXATION (ORIF) ANKLE FRACTURE;  Surgeon: Felecia Shelling, DPM;  Location: [facility] OR;  Service: Podiatry;  Laterality: Right;   REVISON OF ARTERIOVENOUS FISTULA Left 09/26/2020   Procedure: LEFT ARTERIOVENOUS FISTULA  DISTAL REVASCULARIZATION AND LIGATION ( DRIL ) USING GREATER SAPHENOUS VEIN;  Surgeon: Maeola Harman, MD;  Location: [facility] OR;  Service: Vascular;  Laterality: Left;   SAVORY DILATION N/A 05/17/2013   Procedure: SAVORY DILATION;  Surgeon: West Bali, MD;  Location: [facility];  Service: Endoscopy;  Laterality: N/A;  14/15/16   SKIN FULL THICKNESS GRAFT Left 05/05/2018   Procedure: ABDOMINAL DERMIS FAT SKIN  GRAFT FULL THICKNESS LEFT EYE;  Surgeon: Floydene Flock, MD;  Location: [facility] OR;  Service: Ophthalmology;  Laterality: Left;   TUBAL LIGATION     VEIN HARVEST Left 09/26/2020   Procedure: LEFT LEG GREATER SAPHENOUS VEIN HARVEST;  Surgeon: Maeola Harman, MD;  Location: [facility] OR;  Service: Vascular;  Laterality: Left;   Patient Active Problem List   Diagnosis Date Noted   Opioid dependence (HCC) 10/18/2021   Cellulitis and abscess of hand 10/18/2021   Left hand pain 10/17/2021   Cellulitis of left hand 10/17/2021   ESRD (end stage renal disease) (HCC) 09/26/2020   Hypoalbuminemia 07/06/2020   Acute respiratory failure with hypoxia (HCC) 07/06/2020   CAP (community acquired pneumonia) 07/05/2020   Trimalleolar fracture of ankle, closed, right, initial encounter 12/17/2018   Pain and swelling of ankle, right 12/16/2018   Metabolic encephalopathy 09/26/2018  SIRS (systemic inflammatory response syndrome) (HCC) 06/23/2018   UTI (urinary tract infection) 02/26/2018   Volume overload 02/25/2018   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   Dialysis patient (HCC) 12/13/2017   ESRD (end stage renal disease) on dialysis (HCC)    Chronic diarrhea 10/08/2017   Anemia in chronic kidney disease 05/24/2017   Chronic obstructive pulmonary disease (HCC) 05/24/2017   Heart failure (HCC) 05/24/2017   Osteopathy in diseases classified elsewhere, unspecified site 05/24/2017   Renal failure (ARF), acute on chronic (HCC) 04/22/2017   Anasarca associated with disorder of kidney 04/20/2017   Protein-calorie malnutrition, severe 11/29/2016   HTN (hypertension), malignant 11/20/2016   Bipolar 1 disorder (HCC) 11/20/2016   Noncompliance with medication regimen 11/20/2016   Panic attack 11/20/2016   Controlled type 2 diabetes mellitus without complication, without long-term current use of insulin (HCC) 10/27/2016   Sensory disturbance 10/27/2016   Left hemiparesis (HCC) 10/27/2016    Cholelithiasis 04/26/2014   Hyponatremia 04/26/2014   Gallstones 04/06/2014   Diabetic ulcer of left great toe (HCC) 10/09/2013   Type 2 diabetes mellitus treated with insulin (HCC) 05/30/2013   Hyperglycemia due to diabetes mellitus (HCC) 05/30/2013   Internal hemorrhoids with other complication 04/28/2013   Umbilical hernia without mention of obstruction or gangrene 04/15/2013   Anemia 04/15/2013   Obesity, Class III, BMI 40-49.9 (morbid obesity) (HCC) 10/08/2012   DM 05/13/2010   Hyperlipidemia 05/13/2010   Morbid obesity (HCC) 05/13/2010   Depression with anxiety 05/13/2010   RESTLESS LEG SYNDROME 05/13/2010   Essential hypertension 05/13/2010   GERD 05/13/2010   RECTAL BLEEDING 05/13/2010   OSA on CPAP 05/13/2010   Dysphagia, oropharyngeal phase 05/13/2010    ONSET DATE: 11/25/2022  REFERRING DIAG: abdominal wound   THERAPY DIAG:  Chronic abdominal wound infection, sequela  Lower abdominal pain  Rationale for Evaluation and Treatment: Rehabilitation     Wound Therapy - 01/26/23 1448     Subjective pt arrived at wrong time for appt and able to work into schedule.  States she's been showering and changing her bandage.  comes today with gauze falling out of wound, not secured.    Patient and Family Stated Goals wound to be healed    Date of Onset 11/25/22    Pain Scale 0-10    Pain Score 5     Pain Type Other (Comment)   pain with debridement   Pain Location Abdomen    Pain Orientation Lower    Pain Descriptors / Indicators Sharp;Sore    Evaluation and Treatment Procedures Explained to Patient/Family Yes    Evaluation and Treatment Procedures agreed to    Wound Properties Date First Assessed: 01/16/23 Time First Assessed: 1530 Location: Abdomen Location Orientation: Lower;Medial;Right Wound Description (Comments): Rt lower abdominal Present on Admission: Yes   Wound Image Images linked: 1    Dressing Type Silver hydrofiber;Gauze (Comment);Abdominal pads    Dressing  Changed Changed    Dressing Status Old drainage;New drainage    Dressing Change Frequency PRN    Site / Wound Assessment Pink;Yellow    % Wound base Red or Granulating 70%    % Wound base Yellow/Fibrinous Exudate 30%    Peri-wound Assessment Edema;Induration   hardening goes to the left 11 cm, Rt 4, superior 4   Wound Length (cm) 2.5 cm    Wound Width (cm) 3.8 cm    Wound Depth (cm) 1.5 cm    Wound Volume (cm^3) 14.25 cm^3    Wound Surface Area (  cm^2) 9.5 cm^2    Drainage Amount Minimal    Drainage Description Serosanguineous    Treatment Cleansed;Debridement (Selective)    Selective Debridement (non-excisional) - Location wound bed    Selective Debridement (non-excisional) - Tools Used Forceps;Scissors    Selective Debridement (non-excisional) - Tissue Removed devitalized tissue    Wound Therapy - Clinical Statement see below    Wound Therapy - Functional Problem List difficulty with dressing , cleansing    Factors Delaying/Impairing Wound Healing Altered sensation;Diabetes Mellitus;Infection - systemic/local;Polypharmacy;Vascular compromise    Hydrotherapy Plan Debridement;Dressing change;Patient/family education    Wound Therapy - Frequency 2X / week   for 6 weeks   Wound Therapy - Current Recommendations PT    Wound Plan see TIW x 6 weeks to ensure proper healing is occuring    Dressing  medihoney sheet, 4x4 followed by ABD pad and medipore tape.    Dressing xerform on smaller skin irritation in pantline f/b 2X2 and medipore tape                PATIENT EDUCATION: Education details: self massage to indurated area surrounding the wound, the importance of keeping her sugar level near 100, eat extra protein, keep dressing dry.  Person educated: Patient and Caregiver   Education method: Explanation Education comprehension: verbalized understanding   HOME EXERCISE PROGRAM: Self manual   GOALS: Goals reviewed with patient? No  SHORT TERM GOALS: Target date:  01/30/2023  Wound to be 100% granulated Baseline: Goal status: INITIAL  2.  Pain to be no greater than 5/10 Baseline:  Goal status: INITIAL   LONG TERM GOALS: Target date: 02/27/2023  Wound size to be no more than 2x2 cm  Baseline:  Goal status: INITIAL  2.  Wound to have  no undermining Baseline:  Goal status: INITIAL  3.  Depth to be no greater than 1 cm  Baseline:  Goal status: INITIAL  4.  Pain to be no greater than a 2/10 Baseline:  Goal status: INITIAL  5.  Aide to be confident in continued care of wound  Baseline:  Goal status: INITIAL    ASSESSMENT:  CLINICAL IMPRESSION: Wound not covered, more slough present than last visit.  Overall measuring the same as last visit.  Appears to have more redness perimeter as well.  Instructed not to submerge/take baths at this point as wound does not need to get wet or be changed unless drains through.  PT verbalized understanding.  Cleansed well with saline, pulse lavage discontinued.  Debrided devitalized tissue mostly around perimeter.  Slough in inferior portion of wound adherent and not able to remove.   Wound photographed and re-measured this session (see media). Again, instructed to keep dressing over area at pant line as this is irritating and opening area back up.  Therapist secured xeroform and 2X2 over this with medipore. Pt given medihoney sheet to use if needed at home if dressing soaks through and needs to be changed.    Patient is a 57 y.o. female who was seen today for physical therapy evaluation and treatment for  Diagnosis Description  non healing wound abdominal wound  PMH affecting wound healing includes Mhistory of insulin-dependent type 2 diabetes, bipolar 1 disorder, COPD, ESRD on hemodialysis TTS, nonalcoholic cirrhosis on dialysis, sleep apnea on CPAP, hypertension, restless legs .  Pt has had a progressive abdominal wound and is now being referred to skilled PT for treatment.  Ms. Gipp will benefit from  skilled PT to ensure proper wound healing  is occurring as the pt is legally blind.    OBJECTIVE IMPAIRMENTS: obesity, pain, and decreased skin integrity .   ACTIVITY LIMITATIONS: dressing and hygiene/grooming  REHAB POTENTIAL: Good  CLINICAL DECISION MAKING: Stable/uncomplicated  EVALUATION COMPLEXITY: Moderate  PLAN: PT FREQUENCY: 2x/week  PT DURATION: 6 weeks  PLANNED INTERVENTIONS: Patient/Family education, Self Care, Manual therapy, and debridement and dressing change  PLAN FOR NEXT SESSION:  Continue with debridement and dressing change.  PT IS LEGALLY BLIND AND NEEDS NAVIGATIONAL ASSISTANCE TO GET TO WOUND ROOM*  Lurena Nida, PTA/CLT [facility] Ph: [phone]  01/26/2023, 3:09 PM

## 2023-01-26 NOTE — Telephone Encounter (Signed)
First no show:  Called both home and mobile number.   Therapist unable to leave message at either phone.  Virgina Organ, PT CLT [phone]

## 2023-01-28 ENCOUNTER — Encounter (HOSPITAL_COMMUNITY): Payer: Self-pay

## 2023-01-28 ENCOUNTER — Ambulatory Visit (HOSPITAL_COMMUNITY): Payer: Medicaid Other

## 2023-01-28 DIAGNOSIS — R1030 Lower abdominal pain, unspecified: Secondary | ICD-10-CM

## 2023-01-28 DIAGNOSIS — S31109S Unspecified open wound of abdominal wall, unspecified quadrant without penetration into peritoneal cavity, sequela: Secondary | ICD-10-CM | POA: Diagnosis not present

## 2023-01-28 DIAGNOSIS — L089 Local infection of the skin and subcutaneous tissue, unspecified: Secondary | ICD-10-CM

## 2023-01-28 NOTE — Therapy (Signed)
OUTPATIENT PHYSICAL THERAPY Wound Treatment   Patient Name: Martha [name] MRN: [id number] DOB:[birth year], 57 y.o., female Today's Date: 01/28/2023   PCP: [facility] REFERRING PROVIDER: Hyler, Janine Limbo, NP  END OF SESSION:  PT End of Session - 01/28/23 1044     Visit Number 4    Number of Visits 12    Date for PT Re-Evaluation 02/27/23    Authorization Type medicaid 3 visits approved 6/24-7/7    Authorization - Visit Number 3    Authorization - Number of Visits 3    Progress Note Due on Visit 4    PT Start Time 1002    PT Stop Time 1034    PT Time Calculation (min) 32 min    Activity Tolerance Patient tolerated treatment well    Behavior During Therapy WFL for tasks assessed/performed             Past Medical History:  Diagnosis Date   Acid reflux    takes Tums   Anemia    Arthritis    Bipolar 1 disorder (HCC)    Blindness of left eye    left eye removed   Carpal tunnel syndrome, bilateral    Cervical radiculopathy    CHF (congestive heart failure) (HCC)    Chronic kidney disease    Stage 5- 01/25/17   Constipation    COPD (chronic obstructive pulmonary disease) (HCC) 2014   bronchitis   Degenerative disc disease, thoracic    Depression    Diabetes mellitus    Type II   Diabetic retinopathy (HCC)    Dyspnea    when walking   End stage kidney disease (HCC)    M, W, F [facility]   Gout    Hypertension    Non-alcoholic cirrhosis (HCC)    Noncompliance with medication regimen    Noncompliance with medication regimen    Obesity (BMI 30-39.9)    OSA (obstructive sleep apnea)    cpap   Panic attack    RLS (restless legs syndrome)    Past Surgical History:  Procedure Laterality Date   AORTIC ARCH ANGIOGRAPHY N/A 09/11/2020   Procedure: AORTIC ARCH ANGIOGRAPHY;  Surgeon: Nada Libman, MD;  Location: [facility];  Service: Cardiovascular;  Laterality: N/A;   AV FISTULA PLACEMENT Right 01/27/2017   Procedure: ARTERIOVENOUS (AV) FISTULA  CREATION-RIGHT ARM;  Surgeon: Sherren Kerns, MD;  Location: [facility] OR;  Service: Vascular;  Laterality: Right;   AV FISTULA PLACEMENT Left 08/03/2018   Procedure: LEFT ARTERIOVENOUS (AV) FISTULA CREATION;  Surgeon: Sherren Kerns, MD;  Location: [facility] OR;  Service: Vascular;  Laterality: Left;   BASCILIC VEIN TRANSPOSITION Left 04/26/2019   Procedure: SECOND STAGE BASILIC VEIN TRANSPOSITION LEFT ARM;  Surgeon: Sherren Kerns, MD;  Location: [facility] OR;  Service: Vascular;  Laterality: Left;   BIOPSY N/A 05/17/2013   Procedure: BIOPSY;  Surgeon: West Bali, MD;  Location: [facility] ORS;  Service: Endoscopy;  Laterality: N/A;   CARPAL TUNNEL RELEASE Left 11/25/2018   Procedure: LEFT CARPAL TUNNEL RELEASE;  Surgeon: Dairl Ponder, MD;  Location: [facility] OR;  Service: Orthopedics;  Laterality: Left;   CATARACT EXTRACTION Right    CHOLECYSTECTOMY N/A 04/27/2014   Procedure: LAPAROSCOPIC CHOLECYSTECTOMY;  Surgeon: Marlane Hatcher, MD;  Location: [facility];  Service: General;  Laterality: N/A;   COLONOSCOPY  06/06/2010   WUJ:WJXBJY bleeding secondary to internal hemorrhoids but incomplete evaluation secondary to poor right colon prep/small rectal and sigmoid colon polyps (hyperplastic).  PROPOFOL   COLONOSCOPY  May 2013   Dr. Gilliam/[facility]: 5 mm a descending colon polyp, hyperplastic. Adequate bowel prep.   ENDOMETRIAL ABLATION     ENUCLEATION Left 02/04/2018   ENUCLEATION WITH PLACEMENT OF IMPLANT LEFT EYE   ENUCLEATION Left 02/04/2018   Procedure: ENUCLEATION WITH PLACEMENT OF IMPLANT LEFT EYE;  Surgeon: Floydene Flock, MD;  Location: [facility] OR;  Service: Ophthalmology;  Laterality: Left;   ESOPHAGOGASTRODUODENOSCOPY  06/06/2010   ZOX:WRUEAVW erythema and edema of body of stomach, with sessile polypoid lesions. bx benign. no h.pylori   ESOPHAGOGASTRODUODENOSCOPY (EGD) WITH PROPOFOL N/A 05/17/2013   Dr. Darrick Penna: normal esophagus, moderate nodular gastritis, negative path, empiric Savary dilation   EYE SURGERY   07/2017   sx for glaucoma   FLEXIBLE SIGMOIDOSCOPY N/A 05/17/2013   Dr. Darrick Penna: moderate sized internal hemorrhoids   HEMORRHOID BANDING N/A 05/17/2013   Procedure: HEMORRHOID BANDING;  Surgeon: West Bali, MD;  Location: [facility];  Service: Endoscopy;  Laterality: N/A;  2 bands placed   INCISION AND DRAINAGE ABSCESS Left 10/11/2013   Procedure: INCISION AND DRAINAGE AND DEBRIDEMENT LEFT BREAST  ABSCESS;  Surgeon: Marlane Hatcher, MD;  Location: [facility];  Service: General;  Laterality: Left;   INSERTION OF DIALYSIS CATHETER N/A 06/08/2018   Procedure: INSERTION OF Right internal jugular TUNNELED  DIALYSIS CATHETER;  Surgeon: Chuck Hint, MD;  Location: [facility] OR;  Service: Vascular;  Laterality: N/A;   IRRIGATION AND DEBRIDEMENT ABSCESS Right 06/01/2013   Procedure: INCISION AND DRAINAGE AND DEBRIDEMENT ABSCESS RIGHT BREAST;  Surgeon: Marlane Hatcher, MD;  Location: [facility];  Service: General;  Laterality: Right;   LEFT EYE REMOVED Left 01/2018   [facility] on [address].   LIGATION OF ARTERIOVENOUS  FISTULA Right 06/08/2018   Procedure: LIGATION OF ARTERIOVENOUS  FISTULA RIGHT ARM;  Surgeon: Chuck Hint, MD;  Location: [facility] OR;  Service: Vascular;  Laterality: Right;   ORIF ANKLE FRACTURE Right 12/17/2018   Procedure: OPEN REDUCTION INTERNAL FIXATION (ORIF) ANKLE FRACTURE;  Surgeon: Felecia Shelling, DPM;  Location: [facility] OR;  Service: Podiatry;  Laterality: Right;   REVISON OF ARTERIOVENOUS FISTULA Left 09/26/2020   Procedure: LEFT ARTERIOVENOUS FISTULA  DISTAL REVASCULARIZATION AND LIGATION ( DRIL ) USING GREATER SAPHENOUS VEIN;  Surgeon: Maeola Harman, MD;  Location: [facility] OR;  Service: Vascular;  Laterality: Left;   SAVORY DILATION N/A 05/17/2013   Procedure: SAVORY DILATION;  Surgeon: West Bali, MD;  Location: [facility];  Service: Endoscopy;  Laterality: N/A;  14/15/16   SKIN FULL THICKNESS GRAFT Left 05/05/2018   Procedure: ABDOMINAL DERMIS FAT SKIN  GRAFT FULL THICKNESS LEFT EYE;  Surgeon: Floydene Flock, MD;  Location: [facility] OR;  Service: Ophthalmology;  Laterality: Left;   TUBAL LIGATION     VEIN HARVEST Left 09/26/2020   Procedure: LEFT LEG GREATER SAPHENOUS VEIN HARVEST;  Surgeon: Maeola Harman, MD;  Location: [facility] OR;  Service: Vascular;  Laterality: Left;   Patient Active Problem List   Diagnosis Date Noted   Opioid dependence (HCC) 10/18/2021   Cellulitis and abscess of hand 10/18/2021   Left hand pain 10/17/2021   Cellulitis of left hand 10/17/2021   ESRD (end stage renal disease) (HCC) 09/26/2020   Hypoalbuminemia 07/06/2020   Acute respiratory failure with hypoxia (HCC) 07/06/2020   CAP (community acquired pneumonia) 07/05/2020   Trimalleolar fracture of ankle, closed, right, initial encounter 12/17/2018   Pain and swelling of ankle, right 12/16/2018   Metabolic encephalopathy 09/26/2018  SIRS (systemic inflammatory response syndrome) (HCC) 06/23/2018   UTI (urinary tract infection) 02/26/2018   Volume overload 02/25/2018   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   Dialysis patient (HCC) 12/13/2017   ESRD (end stage renal disease) on dialysis (HCC)    Chronic diarrhea 10/08/2017   Anemia in chronic kidney disease 05/24/2017   Chronic obstructive pulmonary disease (HCC) 05/24/2017   Heart failure (HCC) 05/24/2017   Osteopathy in diseases classified elsewhere, unspecified site 05/24/2017   Renal failure (ARF), acute on chronic (HCC) 04/22/2017   Anasarca associated with disorder of kidney 04/20/2017   Protein-calorie malnutrition, severe 11/29/2016   HTN (hypertension), malignant 11/20/2016   Bipolar 1 disorder (HCC) 11/20/2016   Noncompliance with medication regimen 11/20/2016   Panic attack 11/20/2016   Controlled type 2 diabetes mellitus without complication, without long-term current use of insulin (HCC) 10/27/2016   Sensory disturbance 10/27/2016   Left hemiparesis (HCC) 10/27/2016    Cholelithiasis 04/26/2014   Hyponatremia 04/26/2014   Gallstones 04/06/2014   Diabetic ulcer of left great toe (HCC) 10/09/2013   Type 2 diabetes mellitus treated with insulin (HCC) 05/30/2013   Hyperglycemia due to diabetes mellitus (HCC) 05/30/2013   Internal hemorrhoids with other complication 04/28/2013   Umbilical hernia without mention of obstruction or gangrene 04/15/2013   Anemia 04/15/2013   Obesity, Class III, BMI 40-49.9 (morbid obesity) (HCC) 10/08/2012   DM 05/13/2010   Hyperlipidemia 05/13/2010   Morbid obesity (HCC) 05/13/2010   Depression with anxiety 05/13/2010   RESTLESS LEG SYNDROME 05/13/2010   Essential hypertension 05/13/2010   GERD 05/13/2010   RECTAL BLEEDING 05/13/2010   OSA on CPAP 05/13/2010   Dysphagia, oropharyngeal phase 05/13/2010    ONSET DATE: 11/25/2022  REFERRING DIAG: abdominal wound   THERAPY DIAG:  Chronic abdominal wound infection, sequela  Lower abdominal pain  Rationale for Evaluation and Treatment: Rehabilitation     Wound Therapy - 01/28/23 0001     Subjective Pt stated she had to remove dressings at dialysis yesterday and reports increased pain.    Patient and Family Stated Goals wound to be healed    Date of Onset 11/25/22    Pain Scale 0-10    Pain Score 10-Worst pain ever    Pain Location Abdomen    Pain Orientation Lower    Pain Descriptors / Indicators Sore;Sharp    Evaluation and Treatment Procedures Explained to Patient/Family Yes    Evaluation and Treatment Procedures agreed to    Wound Properties Date First Assessed: 01/16/23 Time First Assessed: 1530 Location: Abdomen Location Orientation: Lower;Medial;Right Wound Description (Comments): Rt lower abdominal Present on Admission: Yes   Wound Image Images linked: 1    Dressing Type Other (Comment)   medihoney sheet, 4x4 followed by ABD pad and medipore tape.   Dressing Changed Changed    Dressing Status Old drainage;New drainage    Dressing Change Frequency PRN     Site / Wound Assessment Pink;Yellow    % Wound base Red or Granulating 75%    % Wound base Yellow/Fibrinous Exudate 25%    Peri-wound Assessment Edema;Induration   Hardening goes Lt 11 cm, Rt 4cm, superior 4cm   Wound Length (cm) 2.7 cm    Wound Width (cm) 4 cm    Wound Depth (cm) 1.3 cm    Wound Volume (cm^3) 14.04 cm^3    Wound Surface Area (cm^2) 10.8 cm^2    Drainage Amount Minimal    Drainage Description Serosanguineous    Treatment Cleansed;Debridement (Selective)  Selective Debridement (non-excisional) - Location wound bed    Selective Debridement (non-excisional) - Tools Used Forceps;Scissors    Selective Debridement (non-excisional) - Tissue Removed devitalized tissue    Wound Therapy - Clinical Statement see below    Wound Therapy - Functional Problem List difficulty with dressing , cleansing    Factors Delaying/Impairing Wound Healing Altered sensation;Diabetes Mellitus;Infection - systemic/local;Polypharmacy;Vascular compromise    Hydrotherapy Plan Debridement;Dressing change;Patient/family education    Wound Therapy - Frequency 2X / week   for 6 weeks   Wound Therapy - Current Recommendations PT    Wound Plan see TIW x 6 weeks to ensure proper healing is occuring    Dressing  medihoney sheet, 4x4 followed by ABD pad and medipore tape.    Dressing xerform on smaller skin irritation in pantline f/b 2X2 and medipore tape                PATIENT EDUCATION: Education details: self massage to indurated area surrounding the wound, the importance of keeping her sugar level near 100, eat extra protein, keep dressing dry.  Person educated: Patient and Caregiver   Education method: Explanation Education comprehension: verbalized understanding   HOME EXERCISE PROGRAM: Self manual   GOALS: Goals reviewed with patient? No  SHORT TERM GOALS: Target date: 01/30/2023  Wound to be 100% granulated Baseline: Goal status: INITIAL  2.  Pain to be no greater than  5/10 Baseline:  Goal status: INITIAL   LONG TERM GOALS: Target date: 02/27/2023  Wound size to be no more than 2x2 cm  Baseline:  Goal status: INITIAL  2.  Wound to have  no undermining Baseline:  Goal status: INITIAL  3.  Depth to be no greater than 1 cm  Baseline:  Goal status: INITIAL  4.  Pain to be no greater than a 2/10 Baseline:  Goal status: INITIAL  5.  Aide to be confident in continued care of wound  Baseline:  Goal status: INITIAL    ASSESSMENT:  CLINICAL IMPRESSION: Pt arrived with wound uncovered when removed her pants, pt stated dressings changed during dialysis yesterday and reports of increased pain following dressing change, difficult to assess current drainage.  Noted hair present in wound bed.  Pt educated importance of keeping wound covered at all times to prevent infection.  Cleansed well with saline and selective debridement for removal of devitalized tissue from wound bed, slight redness noted perimeter of wound.  Continued with medihoney to address adherent slough in wound bed, ABD pad and medipore tape.  Encouraged pt to not soak wound in bath, keep wound covered and change as needed if drains through dressings.  Requested further authorization through Medicaid today.  Patient is a 57 y.o. female who was seen today for physical therapy evaluation and treatment for  Diagnosis Description  non healing wound abdominal wound  PMH affecting wound healing includes Mhistory of insulin-dependent type 2 diabetes, bipolar 1 disorder, COPD, ESRD on hemodialysis TTS, nonalcoholic cirrhosis on dialysis, sleep apnea on CPAP, hypertension, restless legs .  Pt has had a progressive abdominal wound and is now being referred to skilled PT for treatment.  Ms. Disalvo will benefit from skilled PT to ensure proper wound healing is occurring as the pt is legally blind.    OBJECTIVE IMPAIRMENTS: obesity, pain, and decreased skin integrity .   ACTIVITY LIMITATIONS: dressing  and hygiene/grooming  REHAB POTENTIAL: Good  CLINICAL DECISION MAKING: Stable/uncomplicated  EVALUATION COMPLEXITY: Moderate  PLAN: PT FREQUENCY: 2x/week  PT DURATION: 6 weeks  PLANNED INTERVENTIONS: Patient/Family education, Self Care, Manual therapy, and debridement and dressing change  PLAN FOR NEXT SESSION:  Continue with debridement and dressing change.  PT IS LEGALLY BLIND AND NEEDS NAVIGATIONAL ASSISTANCE TO GET TO WOUND ROOM*  Becky Sax, LPTA/CLT; CBIS [phone]  Juel Burrow, PTA 01/28/2023, 10:46 AM   01/28/2023, 10:45 AM

## 2023-01-30 ENCOUNTER — Ambulatory Visit (HOSPITAL_COMMUNITY): Payer: Medicaid Other | Admitting: Physical Therapy

## 2023-02-04 ENCOUNTER — Ambulatory Visit (HOSPITAL_COMMUNITY): Payer: Medicaid Other | Admitting: Physical Therapy

## 2023-02-06 ENCOUNTER — Ambulatory Visit (HOSPITAL_COMMUNITY): Payer: Medicaid Other

## 2023-02-06 ENCOUNTER — Encounter (HOSPITAL_COMMUNITY): Payer: Self-pay

## 2023-02-06 DIAGNOSIS — S31109S Unspecified open wound of abdominal wall, unspecified quadrant without penetration into peritoneal cavity, sequela: Secondary | ICD-10-CM | POA: Diagnosis not present

## 2023-02-06 DIAGNOSIS — L089 Local infection of the skin and subcutaneous tissue, unspecified: Secondary | ICD-10-CM

## 2023-02-06 DIAGNOSIS — R1030 Lower abdominal pain, unspecified: Secondary | ICD-10-CM

## 2023-02-06 NOTE — Therapy (Addendum)
OUTPATIENT PHYSICAL THERAPY Wound Treatment   Patient Name: Martha [name] MRN: [id number] DOB:02/18/1966, 57 y.o., female Today's Date: 02/06/2023   PCP: [facility] REFERRING PROVIDER: Hyler, Janine Limbo, NP  END OF SESSION:  PT End of Session - 02/06/23 1407     Visit Number 5    Number of Visits 12    Date for PT Re-Evaluation 02/27/23    Authorization Type medicaid 3 visits approved 6/24-7/7;  Requested further authorization on 01/28/23.    Authorization Time Period 12 visits approved from 7/8-->03/15/23    Authorization - Visit Number 1    Authorization - Number of Visits 12    Progress Note Due on Visit 17    PT Start Time 1055    PT Stop Time 1126    PT Time Calculation (min) 31 min    Activity Tolerance Patient tolerated treatment well    Behavior During Therapy WFL for tasks assessed/performed             Past Medical History:  Diagnosis Date   Acid reflux    takes Tums   Anemia    Arthritis    Bipolar 1 disorder (HCC)    Blindness of left eye    left eye removed   Carpal tunnel syndrome, bilateral    Cervical radiculopathy    CHF (congestive heart failure) (HCC)    Chronic kidney disease    Stage 5- 01/25/17   Constipation    COPD (chronic obstructive pulmonary disease) (HCC) 2014   bronchitis   Degenerative disc disease, thoracic    Depression    Diabetes mellitus    Type II   Diabetic retinopathy (HCC)    Dyspnea    when walking   End stage kidney disease (HCC)    M, W, F [facility]   Gout    Hypertension    Non-alcoholic cirrhosis (HCC)    Noncompliance with medication regimen    Noncompliance with medication regimen    Obesity (BMI 30-39.9)    OSA (obstructive sleep apnea)    cpap   Panic attack    RLS (restless legs syndrome)    Past Surgical History:  Procedure Laterality Date   AORTIC ARCH ANGIOGRAPHY N/A 09/11/2020   Procedure: AORTIC ARCH ANGIOGRAPHY;  Surgeon: Nada Libman, MD;  Location: [facility];  Service:  Cardiovascular;  Laterality: N/A;   AV FISTULA PLACEMENT Right 01/27/2017   Procedure: ARTERIOVENOUS (AV) FISTULA CREATION-RIGHT ARM;  Surgeon: Sherren Kerns, MD;  Location: [facility] OR;  Service: Vascular;  Laterality: Right;   AV FISTULA PLACEMENT Left 08/03/2018   Procedure: LEFT ARTERIOVENOUS (AV) FISTULA CREATION;  Surgeon: Sherren Kerns, MD;  Location: [facility] OR;  Service: Vascular;  Laterality: Left;   BASCILIC VEIN TRANSPOSITION Left 04/26/2019   Procedure: SECOND STAGE BASILIC VEIN TRANSPOSITION LEFT ARM;  Surgeon: Sherren Kerns, MD;  Location: [facility] OR;  Service: Vascular;  Laterality: Left;   BIOPSY N/A 05/17/2013   Procedure: BIOPSY;  Surgeon: West Bali, MD;  Location: [facility];  Service: Endoscopy;  Laterality: N/A;   CARPAL TUNNEL RELEASE Left 11/25/2018   Procedure: LEFT CARPAL TUNNEL RELEASE;  Surgeon: Dairl Ponder, MD;  Location: [facility] OR;  Service: Orthopedics;  Laterality: Left;   CATARACT EXTRACTION Right    CHOLECYSTECTOMY N/A 04/27/2014   Procedure: LAPAROSCOPIC CHOLECYSTECTOMY;  Surgeon: Marlane Hatcher, MD;  Location: [facility];  Service: General;  Laterality: N/A;   COLONOSCOPY  06/06/2010   WUJ:WJXBJY bleeding secondary to  internal hemorrhoids but incomplete evaluation secondary to poor right colon prep/small rectal and sigmoid colon polyps (hyperplastic). PROPOFOL   COLONOSCOPY  May 2013   Dr. Gilliam/[facility]: 5 mm a descending colon polyp, hyperplastic. Adequate bowel prep.   ENDOMETRIAL ABLATION     ENUCLEATION Left 02/04/2018   ENUCLEATION WITH PLACEMENT OF IMPLANT LEFT EYE   ENUCLEATION Left 02/04/2018   Procedure: ENUCLEATION WITH PLACEMENT OF IMPLANT LEFT EYE;  Surgeon: Floydene Flock, MD;  Location: [facility] OR;  Service: Ophthalmology;  Laterality: Left;   ESOPHAGOGASTRODUODENOSCOPY  06/06/2010   ZOX:WRUEAVW erythema and edema of body of stomach, with sessile polypoid lesions. bx benign. no h.pylori   ESOPHAGOGASTRODUODENOSCOPY (EGD) WITH PROPOFOL N/A 05/17/2013    Dr. Darrick Penna: normal esophagus, moderate nodular gastritis, negative path, empiric Savary dilation   EYE SURGERY  07/2017   sx for glaucoma   FLEXIBLE SIGMOIDOSCOPY N/A 05/17/2013   Dr. Darrick Penna: moderate sized internal hemorrhoids   HEMORRHOID BANDING N/A 05/17/2013   Procedure: HEMORRHOID BANDING;  Surgeon: West Bali, MD;  Location: [facility];  Service: Endoscopy;  Laterality: N/A;  2 bands placed   INCISION AND DRAINAGE ABSCESS Left 10/11/2013   Procedure: INCISION AND DRAINAGE AND DEBRIDEMENT LEFT BREAST  ABSCESS;  Surgeon: Marlane Hatcher, MD;  Location: [facility];  Service: General;  Laterality: Left;   INSERTION OF DIALYSIS CATHETER N/A 06/08/2018   Procedure: INSERTION OF Right internal jugular TUNNELED  DIALYSIS CATHETER;  Surgeon: Chuck Hint, MD;  Location: [facility] OR;  Service: Vascular;  Laterality: N/A;   IRRIGATION AND DEBRIDEMENT ABSCESS Right 06/01/2013   Procedure: INCISION AND DRAINAGE AND DEBRIDEMENT ABSCESS RIGHT BREAST;  Surgeon: Marlane Hatcher, MD;  Location: [facility];  Service: General;  Laterality: Right;   LEFT EYE REMOVED Left 01/2018   [facility] [address].   LIGATION OF ARTERIOVENOUS  FISTULA Right 06/08/2018   Procedure: LIGATION OF ARTERIOVENOUS  FISTULA RIGHT ARM;  Surgeon: Chuck Hint, MD;  Location: [facility] OR;  Service: Vascular;  Laterality: Right;   ORIF ANKLE FRACTURE Right 12/17/2018   Procedure: OPEN REDUCTION INTERNAL FIXATION (ORIF) ANKLE FRACTURE;  Surgeon: Felecia Shelling, DPM;  Location: [facility] OR;  Service: Podiatry;  Laterality: Right;   REVISON OF ARTERIOVENOUS FISTULA Left 09/26/2020   Procedure: LEFT ARTERIOVENOUS FISTULA  DISTAL REVASCULARIZATION AND LIGATION ( DRIL ) USING GREATER SAPHENOUS VEIN;  Surgeon: Maeola Harman, MD;  Location: [facility] OR;  Service: Vascular;  Laterality: Left;   SAVORY DILATION N/A 05/17/2013   Procedure: SAVORY DILATION;  Surgeon: West Bali, MD;  Location: [facility];  Service: Endoscopy;   Laterality: N/A;  14/15/16   SKIN FULL THICKNESS GRAFT Left 05/05/2018   Procedure: ABDOMINAL DERMIS FAT SKIN GRAFT FULL THICKNESS LEFT EYE;  Surgeon: Floydene Flock, MD;  Location: [facility] OR;  Service: Ophthalmology;  Laterality: Left;   TUBAL LIGATION     VEIN HARVEST Left 09/26/2020   Procedure: LEFT LEG GREATER SAPHENOUS VEIN HARVEST;  Surgeon: Maeola Harman, MD;  Location: [facility] OR;  Service: Vascular;  Laterality: Left;   Patient Active Problem List   Diagnosis Date Noted   Opioid dependence (HCC) 10/18/2021   Cellulitis and abscess of hand 10/18/2021   Left hand pain 10/17/2021   Cellulitis of left hand 10/17/2021   ESRD (end stage renal disease) (HCC) 09/26/2020   Hypoalbuminemia 07/06/2020   Acute respiratory failure with hypoxia (HCC) 07/06/2020   CAP (community acquired pneumonia) 07/05/2020   Trimalleolar fracture of ankle, closed, right,  initial encounter 12/17/2018   Pain and swelling of ankle, right 12/16/2018   Metabolic encephalopathy 09/26/2018   SIRS (systemic inflammatory response syndrome) (HCC) 06/23/2018   UTI (urinary tract infection) 02/26/2018   Volume overload 02/25/2018   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   Dialysis patient (HCC) 12/13/2017   ESRD (end stage renal disease) on dialysis (HCC)    Chronic diarrhea 10/08/2017   Anemia in chronic kidney disease 05/24/2017   Chronic obstructive pulmonary disease (HCC) 05/24/2017   Heart failure (HCC) 05/24/2017   Osteopathy in diseases classified elsewhere, unspecified site 05/24/2017   Renal failure (ARF), acute on chronic (HCC) 04/22/2017   Anasarca associated with disorder of kidney 04/20/2017   Protein-calorie malnutrition, severe 11/29/2016   HTN (hypertension), malignant 11/20/2016   Bipolar 1 disorder (HCC) 11/20/2016   Noncompliance with medication regimen 11/20/2016   Panic attack 11/20/2016   Controlled type 2 diabetes mellitus without complication, without long-term current  use of insulin (HCC) 10/27/2016   Sensory disturbance 10/27/2016   Left hemiparesis (HCC) 10/27/2016   Cholelithiasis 04/26/2014   Hyponatremia 04/26/2014   Gallstones 04/06/2014   Diabetic ulcer of left great toe (HCC) 10/09/2013   Type 2 diabetes mellitus treated with insulin (HCC) 05/30/2013   Hyperglycemia due to diabetes mellitus (HCC) 05/30/2013   Internal hemorrhoids with other complication 04/28/2013   Umbilical hernia without mention of obstruction or gangrene 04/15/2013   Anemia 04/15/2013   Obesity, Class III, BMI 40-49.9 (morbid obesity) (HCC) 10/08/2012   DM 05/13/2010   Hyperlipidemia 05/13/2010   Morbid obesity (HCC) 05/13/2010   Depression with anxiety 05/13/2010   RESTLESS LEG SYNDROME 05/13/2010   Essential hypertension 05/13/2010   GERD 05/13/2010   RECTAL BLEEDING 05/13/2010   OSA on CPAP 05/13/2010   Dysphagia, oropharyngeal phase 05/13/2010    ONSET DATE: 11/25/2022  REFERRING DIAG: abdominal wound   THERAPY DIAG:  Chronic abdominal wound infection, sequela  Lower abdominal pain  Rationale for Evaluation and Treatment: Rehabilitation     Wound Therapy - 02/06/23 0001     Subjective Pt stated she has new wound.  Given antibiotics but caused her to itch so stopped.  Reports a culture complete, no new antibiotics yet.  No reports of pain.  Has been changing dressings daily and reports drainage has reduced.    Patient and Family Stated Goals wound to be healed    Date of Onset 11/25/22    Pain Scale 0-10    Pain Score 0-No pain    Pain Location Abdomen    Evaluation and Treatment Procedures Explained to Patient/Family Yes    Evaluation and Treatment Procedures agreed to    Wound Properties Date First Assessed: 01/16/23 Time First Assessed: 1530 Location: Abdomen Location Orientation: Lower;Medial;Right Wound Description (Comments): Rt lower abdominal Present on Admission: Yes   Wound Image Images linked: 1    Dressing Type --   Medihoney, 2x2, ABD  pad, medipore tape   Dressing Changed Changed    Dressing Status Old drainage;New drainage    Dressing Change Frequency PRN    Site / Wound Assessment Pink;Yellow    % Wound base Red or Granulating 80%    % Wound base Yellow/Fibrinous Exudate 20%    Peri-wound Assessment Edema;Induration    Wound Length (cm) 2.5 cm    Wound Width (cm) 3.8 cm    Wound Depth (cm) 1.2 cm    Wound Volume (cm^3) 11.4 cm^3    Wound Surface Area (cm^2) 9.5 cm^2  Drainage Amount Minimal    Drainage Description Serosanguineous    Treatment Cleansed;Debridement (Selective)    Wound Properties Date First Assessed: 02/06/23 Time First Assessed: 1100 Wound Type: Incision - Open Location: Abdomen Location Orientation: Left;Lower;Medial   Dressing Type --   Medihoney, 2x2, ABD pad, medipore tape   Dressing Changed Changed    Dressing Status Clean    Dressing Change Frequency PRN    Site / Wound Assessment Yellow;Pink    % Wound base Red or Granulating 0%    % Wound base Yellow/Fibrinous Exudate 100%    % Wound base Black/Eschar 0%    Peri-wound Assessment Edema;Induration    Wound Length (cm) 1 cm    Wound Width (cm) 2 cm    Wound Depth (cm) 3 cm    Wound Volume (cm^3) 6 cm^3    Wound Surface Area (cm^2) 2 cm^2    Drainage Amount Moderate    Drainage Description Serosanguineous    Treatment Cleansed    Selective Debridement (non-excisional) - Location wound bed    Selective Debridement (non-excisional) - Tools Used Forceps;Scissors    Selective Debridement (non-excisional) - Tissue Removed devitalized tissue    Wound Therapy - Clinical Statement see below    Wound Therapy - Functional Problem List difficulty with dressing , cleansing    Factors Delaying/Impairing Wound Healing Altered sensation;Diabetes Mellitus;Infection - systemic/local;Polypharmacy;Vascular compromise    Hydrotherapy Plan Debridement;Dressing change;Patient/family education    Wound Therapy - Frequency 2X / week    Wound Therapy -  Current Recommendations PT    Wound Plan see TIW x 6 weeks to ensure proper healing is occuring    Dressing  medihoney sheet, 4x4 followed by ABD pad and medipore tape.                PATIENT EDUCATION: Education details: self massage to indurated area surrounding the wound, the importance of keeping her sugar level near 100, eat extra protein, keep dressing dry.  Person educated: Patient and Caregiver   Education method: Explanation Education comprehension: verbalized understanding   HOME EXERCISE PROGRAM: Self manual   GOALS: Goals reviewed with patient? No  SHORT TERM GOALS: Target date: 01/30/2023  Wound to be 100% granulated Baseline: Goal status: INITIAL  2.  Pain to be no greater than 5/10 Baseline:  Goal status: INITIAL   LONG TERM GOALS: Target date: 02/27/2023  Wound size to be no more than 2x2 cm  Baseline:  Goal status: INITIAL  2.  Wound to have  no undermining Baseline:  Goal status: INITIAL  3.  Depth to be no greater than 1 cm  Baseline:  Goal status: INITIAL  4.  Pain to be no greater than a 2/10 Baseline:  Goal status: INITIAL  5.  Aide to be confident in continued care of wound  Baseline:  Goal status: INITIAL    ASSESSMENT:  CLINICAL IMPRESSION: Pt arrived with wound uncovered when removed her pants, pt stated dressings changed during dialysis yesterday and reports of increased pain following dressing change, difficult to assess current drainage.  Noted hair present in wound bed.  Pt educated importance of keeping wound covered at all times to prevent infection.  Cleansed well with saline and selective debridement for removal of devitalized tissue from wound bed, slight redness noted perimeter of wound.  Continued with medihoney to address adherent slough in wound bed, ABD pad and medipore tape.  Encouraged pt to not soak wound in bath, keep wound covered and change as needed if  drains through dressings.  Requested further authorization  through Medicaid today.  Pt arrived with dressings on, reports they have been changing daily.  Noted the tape on dressings irritates skin, advised pt and caregiver Chelsea to purchase medipore tape for skin integrity.  Pt arrived with new wound on lower abdomin, reports arrived last week.  Wound measured and cleansed well, no selective debridement complete to new wound this session.  Noted hard surrounding, encouraged pt to massage daily.  Continued with medihoney to address slough on wound bed, ABD pad for drainage and medipore tape.  No reports of pain through session.  Patient is a 57 y.o. female who was seen today for physical therapy evaluation and treatment for  Diagnosis Description  non healing wound abdominal wound  PMH affecting wound healing includes Mhistory of insulin-dependent type 2 diabetes, bipolar 1 disorder, COPD, ESRD on hemodialysis TTS, nonalcoholic cirrhosis on dialysis, sleep apnea on CPAP, hypertension, restless legs .  Pt has had a progressive abdominal wound and is now being referred to skilled PT for treatment.  Ms. Zent will benefit from skilled PT to ensure proper wound healing is occurring as the pt is legally blind.    OBJECTIVE IMPAIRMENTS: obesity, pain, and decreased skin integrity .   ACTIVITY LIMITATIONS: dressing and hygiene/grooming  REHAB POTENTIAL: Good  CLINICAL DECISION MAKING: Stable/uncomplicated  EVALUATION COMPLEXITY: Moderate  PLAN: PT FREQUENCY: 2x/week  PT DURATION: 6 weeks  PLANNED INTERVENTIONS: Patient/Family education, Self Care, Manual therapy, and debridement and dressing change  PLAN FOR NEXT SESSION:  Continue with debridement and dressing change.  PT IS LEGALLY BLIND AND NEEDS NAVIGATIONAL ASSISTANCE TO GET TO WOUND ROOM*  Becky Sax, LPTA/CLT; CBIS [phone]  Juel Burrow, PTA 02/06/2023, 2:09 PM Virgina Organ, PT CLT [phone]   02/06/2023, 2:09 PM

## 2023-02-09 ENCOUNTER — Telehealth (HOSPITAL_COMMUNITY): Payer: Self-pay | Admitting: Physical Therapy

## 2023-02-09 ENCOUNTER — Ambulatory Visit (HOSPITAL_COMMUNITY): Payer: Medicaid Other | Admitting: Physical Therapy

## 2023-02-09 NOTE — Telephone Encounter (Signed)
No show # 2:  Pt states that she thought the appointment was at 3:30 pm, therapist explained that the appointment was at 3:15 and it was now 3:30 pm.  PT then stated that she thought the appointment was at 3:45.    Virgina Organ, PT CLT [phone]

## 2023-02-11 ENCOUNTER — Ambulatory Visit (HOSPITAL_COMMUNITY): Payer: Medicaid Other

## 2023-02-13 ENCOUNTER — Ambulatory Visit (HOSPITAL_COMMUNITY): Payer: Medicaid Other

## 2023-02-13 ENCOUNTER — Encounter (HOSPITAL_COMMUNITY): Payer: Self-pay

## 2023-02-13 DIAGNOSIS — L089 Local infection of the skin and subcutaneous tissue, unspecified: Secondary | ICD-10-CM

## 2023-02-13 DIAGNOSIS — S31109S Unspecified open wound of abdominal wall, unspecified quadrant without penetration into peritoneal cavity, sequela: Secondary | ICD-10-CM | POA: Diagnosis not present

## 2023-02-13 DIAGNOSIS — R1030 Lower abdominal pain, unspecified: Secondary | ICD-10-CM

## 2023-02-13 NOTE — Therapy (Signed)
OUTPATIENT PHYSICAL THERAPY Wound Treatment   Patient Name: Martha [name] MRN: [id number] DOB:1966/07/18, 57 y.o., female Today's Date: 02/13/2023   PCP: [facility] REFERRING PROVIDER: Hyler, Janine Limbo, NP  END OF SESSION:  PT End of Session - 02/13/23 1346     Visit Number 6    Number of Visits 12    Date for PT Re-Evaluation 02/27/23    Authorization Type medicaid 3 visits approved 6/24-7/7;  Requested further authorization on 01/28/23.    Authorization Time Period 12 visits approved from 7/8-->03/15/23    Authorization - Visit Number 2    Authorization - Number of Visits 12    Progress Note Due on Visit 17    PT Start Time 1302    PT Stop Time 1335    PT Time Calculation (min) 33 min    Activity Tolerance Patient tolerated treatment well    Behavior During Therapy WFL for tasks assessed/performed             Past Medical History:  Diagnosis Date   Acid reflux    takes Tums   Anemia    Arthritis    Bipolar 1 disorder (HCC)    Blindness of left eye    left eye removed   Carpal tunnel syndrome, bilateral    Cervical radiculopathy    CHF (congestive heart failure) (HCC)    Chronic kidney disease    Stage 5- 01/25/17   Constipation    COPD (chronic obstructive pulmonary disease) (HCC) 2014   bronchitis   Degenerative disc disease, thoracic    Depression    Diabetes mellitus    Type II   Diabetic retinopathy (HCC)    Dyspnea    when walking   End stage kidney disease (HCC)    M, W, F [facility]   Gout    Hypertension    Non-alcoholic cirrhosis (HCC)    Noncompliance with medication regimen    Noncompliance with medication regimen    Obesity (BMI 30-39.9)    OSA (obstructive sleep apnea)    cpap   Panic attack    RLS (restless legs syndrome)    Past Surgical History:  Procedure Laterality Date   AORTIC ARCH ANGIOGRAPHY N/A 09/11/2020   Procedure: AORTIC ARCH ANGIOGRAPHY;  Surgeon: Nada Libman, MD;  Location: [facility];  Service:  Cardiovascular;  Laterality: N/A;   AV FISTULA PLACEMENT Right 01/27/2017   Procedure: ARTERIOVENOUS (AV) FISTULA CREATION-RIGHT ARM;  Surgeon: Sherren Kerns, MD;  Location: [facility] OR;  Service: Vascular;  Laterality: Right;   AV FISTULA PLACEMENT Left 08/03/2018   Procedure: LEFT ARTERIOVENOUS (AV) FISTULA CREATION;  Surgeon: Sherren Kerns, MD;  Location: [facility] OR;  Service: Vascular;  Laterality: Left;   BASCILIC VEIN TRANSPOSITION Left 04/26/2019   Procedure: SECOND STAGE BASILIC VEIN TRANSPOSITION LEFT ARM;  Surgeon: Sherren Kerns, MD;  Location: [facility] OR;  Service: Vascular;  Laterality: Left;   BIOPSY N/A 05/17/2013   Procedure: BIOPSY;  Surgeon: West Bali, MD;  Location: [facility] ORS;  Service: Endoscopy;  Laterality: N/A;   CARPAL TUNNEL RELEASE Left 11/25/2018   Procedure: LEFT CARPAL TUNNEL RELEASE;  Surgeon: Dairl Ponder, MD;  Location: [facility] OR;  Service: Orthopedics;  Laterality: Left;   CATARACT EXTRACTION Right    CHOLECYSTECTOMY N/A 04/27/2014   Procedure: LAPAROSCOPIC CHOLECYSTECTOMY;  Surgeon: Marlane Hatcher, MD;  Location: [facility];  Service: General;  Laterality: N/A;   COLONOSCOPY  06/06/2010   WUJ:WJXBJY bleeding secondary to  internal hemorrhoids but incomplete evaluation secondary to poor right colon prep/small rectal and sigmoid colon polyps (hyperplastic). PROPOFOL   COLONOSCOPY  May 2013   Dr. Gilliam/[facility]: 5 mm a descending colon polyp, hyperplastic. Adequate bowel prep.   ENDOMETRIAL ABLATION     ENUCLEATION Left 02/04/2018   ENUCLEATION WITH PLACEMENT OF IMPLANT LEFT EYE   ENUCLEATION Left 02/04/2018   Procedure: ENUCLEATION WITH PLACEMENT OF IMPLANT LEFT EYE;  Surgeon: Floydene Flock, MD;  Location: [facility] OR;  Service: Ophthalmology;  Laterality: Left;   ESOPHAGOGASTRODUODENOSCOPY  06/06/2010   WGN:FAOZHYQ erythema and edema of body of stomach, with sessile polypoid lesions. bx benign. no h.pylori   ESOPHAGOGASTRODUODENOSCOPY (EGD) WITH PROPOFOL N/A 05/17/2013    Dr. Darrick Penna: normal esophagus, moderate nodular gastritis, negative path, empiric Savary dilation   EYE SURGERY  07/2017   sx for glaucoma   FLEXIBLE SIGMOIDOSCOPY N/A 05/17/2013   Dr. Darrick Penna: moderate sized internal hemorrhoids   HEMORRHOID BANDING N/A 05/17/2013   Procedure: HEMORRHOID BANDING;  Surgeon: West Bali, MD;  Location: [facility];  Service: Endoscopy;  Laterality: N/A;  2 bands placed   INCISION AND DRAINAGE ABSCESS Left 10/11/2013   Procedure: INCISION AND DRAINAGE AND DEBRIDEMENT LEFT BREAST  ABSCESS;  Surgeon: Marlane Hatcher, MD;  Location: [facility];  Service: General;  Laterality: Left;   INSERTION OF DIALYSIS CATHETER N/A 06/08/2018   Procedure: INSERTION OF Right internal jugular TUNNELED  DIALYSIS CATHETER;  Surgeon: Chuck Hint, MD;  Location: [facility] OR;  Service: Vascular;  Laterality: N/A;   IRRIGATION AND DEBRIDEMENT ABSCESS Right 06/01/2013   Procedure: INCISION AND DRAINAGE AND DEBRIDEMENT ABSCESS RIGHT BREAST;  Surgeon: Marlane Hatcher, MD;  Location: [facility];  Service: General;  Laterality: Right;   LEFT EYE REMOVED Left 01/2018   [facility] [address].   LIGATION OF ARTERIOVENOUS  FISTULA Right 06/08/2018   Procedure: LIGATION OF ARTERIOVENOUS  FISTULA RIGHT ARM;  Surgeon: Chuck Hint, MD;  Location: [facility] OR;  Service: Vascular;  Laterality: Right;   ORIF ANKLE FRACTURE Right 12/17/2018   Procedure: OPEN REDUCTION INTERNAL FIXATION (ORIF) ANKLE FRACTURE;  Surgeon: Felecia Shelling, DPM;  Location: [facility] OR;  Service: Podiatry;  Laterality: Right;   REVISON OF ARTERIOVENOUS FISTULA Left 09/26/2020   Procedure: LEFT ARTERIOVENOUS FISTULA  DISTAL REVASCULARIZATION AND LIGATION ( DRIL ) USING GREATER SAPHENOUS VEIN;  Surgeon: Maeola Harman, MD;  Location: [facility] OR;  Service: Vascular;  Laterality: Left;   SAVORY DILATION N/A 05/17/2013   Procedure: SAVORY DILATION;  Surgeon: West Bali, MD;  Location: [facility];  Service: Endoscopy;   Laterality: N/A;  14/15/16   SKIN FULL THICKNESS GRAFT Left 05/05/2018   Procedure: ABDOMINAL DERMIS FAT SKIN GRAFT FULL THICKNESS LEFT EYE;  Surgeon: Floydene Flock, MD;  Location: [facility] OR;  Service: Ophthalmology;  Laterality: Left;   TUBAL LIGATION     VEIN HARVEST Left 09/26/2020   Procedure: LEFT LEG GREATER SAPHENOUS VEIN HARVEST;  Surgeon: Maeola Harman, MD;  Location: [facility] OR;  Service: Vascular;  Laterality: Left;   Patient Active Problem List   Diagnosis Date Noted   Opioid dependence (HCC) 10/18/2021   Cellulitis and abscess of hand 10/18/2021   Left hand pain 10/17/2021   Cellulitis of left hand 10/17/2021   ESRD (end stage renal disease) (HCC) 09/26/2020   Hypoalbuminemia 07/06/2020   Acute respiratory failure with hypoxia (HCC) 07/06/2020   CAP (community acquired pneumonia) 07/05/2020   Trimalleolar fracture of ankle, closed, right,  initial encounter 12/17/2018   Pain and swelling of ankle, right 12/16/2018   Metabolic encephalopathy 09/26/2018   SIRS (systemic inflammatory response syndrome) (HCC) 06/23/2018   UTI (urinary tract infection) 02/26/2018   Volume overload 02/25/2018   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   Dialysis patient (HCC) 12/13/2017   ESRD (end stage renal disease) on dialysis (HCC)    Chronic diarrhea 10/08/2017   Anemia in chronic kidney disease 05/24/2017   Chronic obstructive pulmonary disease (HCC) 05/24/2017   Heart failure (HCC) 05/24/2017   Osteopathy in diseases classified elsewhere, unspecified site 05/24/2017   Renal failure (ARF), acute on chronic (HCC) 04/22/2017   Anasarca associated with disorder of kidney 04/20/2017   Protein-calorie malnutrition, severe 11/29/2016   HTN (hypertension), malignant 11/20/2016   Bipolar 1 disorder (HCC) 11/20/2016   Noncompliance with medication regimen 11/20/2016   Panic attack 11/20/2016   Controlled type 2 diabetes mellitus without complication, without long-term current  use of insulin (HCC) 10/27/2016   Sensory disturbance 10/27/2016   Left hemiparesis (HCC) 10/27/2016   Cholelithiasis 04/26/2014   Hyponatremia 04/26/2014   Gallstones 04/06/2014   Diabetic ulcer of left great toe (HCC) 10/09/2013   Type 2 diabetes mellitus treated with insulin (HCC) 05/30/2013   Hyperglycemia due to diabetes mellitus (HCC) 05/30/2013   Internal hemorrhoids with other complication 04/28/2013   Umbilical hernia without mention of obstruction or gangrene 04/15/2013   Anemia 04/15/2013   Obesity, Class III, BMI 40-49.9 (morbid obesity) (HCC) 10/08/2012   DM 05/13/2010   Hyperlipidemia 05/13/2010   Morbid obesity (HCC) 05/13/2010   Depression with anxiety 05/13/2010   RESTLESS LEG SYNDROME 05/13/2010   Essential hypertension 05/13/2010   GERD 05/13/2010   RECTAL BLEEDING 05/13/2010   OSA on CPAP 05/13/2010   Dysphagia, oropharyngeal phase 05/13/2010    ONSET DATE: 11/25/2022  REFERRING DIAG: abdominal wound   THERAPY DIAG:  Chronic abdominal wound infection, sequela  Lower abdominal pain  Rationale for Evaluation and Treatment: Rehabilitation     Wound Therapy - 02/13/23 0001     Subjective Pt stated infection found in culture and she is antibiotics, day 3 of 10.  Stated she has been changing dressings daily, unable to find the medipore tape.  Reports dialysis has referred her wound specialist in [place] in August, unsure if they plan on transfering wound care to there.  Stated she has began massaging abdomen daily.    Patient and Family Stated Goals wound to be healed    Date of Onset 11/25/22    Pain Scale 0-10    Pain Score 5     Pain Location Abdomen    Pain Orientation Lower    Pain Descriptors / Indicators Sore;Sharp    Pain Onset On-going    Patients Stated Pain Goal 2    Pain Intervention(s) Emotional support;Repositioned    Evaluation and Treatment Procedures Explained to Patient/Family Yes    Evaluation and Treatment Procedures agreed to     Wound Properties Date First Assessed: 02/06/23 Time First Assessed: 1100 Wound Type: Incision - Open Location: Abdomen Location Orientation: Left;Lower;Medial   Dressing Type --   medihoney, 2x2, ABD pad with medipore tape   Dressing Changed Changed    Dressing Status Old drainage    Dressing Change Frequency PRN    Site / Wound Assessment Yellow;Pink    % Wound base Red or Granulating 0%    % Wound base Yellow/Fibrinous Exudate 100%    Peri-wound Assessment Edema;Induration    Drainage Amount  Moderate    Drainage Description Serosanguineous    Treatment Cleansed    Wound Properties Date First Assessed: 01/16/23 Time First Assessed: 1530 Location: Abdomen Location Orientation: Lower;Medial;Right Wound Description (Comments): Rt lower abdominal Present on Admission: Yes   Dressing Type Impregnated gauze (petrolatum);Tape dressing;Gauze (Comment)   xeroform, vaseline perimeter, 2x2, ABD pad with medipore tape   Dressing Changed Changed    Dressing Status Old drainage    Dressing Change Frequency PRN    Site / Wound Assessment Pink;Yellow    % Wound base Red or Granulating 85%    % Wound base Yellow/Fibrinous Exudate 15%    Peri-wound Assessment Edema;Induration    Drainage Amount Minimal    Drainage Description Serosanguineous    Treatment Cleansed;Debridement (Selective)    Selective Debridement (non-excisional) - Location wound bed    Selective Debridement (non-excisional) - Tools Used Forceps;Scalpel    Selective Debridement (non-excisional) - Tissue Removed devitalized tissue    Wound Therapy - Clinical Statement see below    Wound Therapy - Functional Problem List difficulty with dressing , cleansing    Factors Delaying/Impairing Wound Healing Altered sensation;Diabetes Mellitus;Infection - systemic/local;Polypharmacy;Vascular compromise    Hydrotherapy Plan Debridement;Dressing change;Patient/family education    Wound Therapy - Frequency 2X / week    Wound Therapy - Current  Recommendations PT    Wound Plan see TIW x 6 weeks to ensure proper healing is occuring    Dressing  superior: xeroform, vaseline, 2x2, abd pad, medipore tape    Dressing Inferior: packed with medihoney, 2x2, ABD pad with medipore tape                PATIENT EDUCATION: Education details: self massage to indurated area surrounding the wound, the importance of keeping her sugar level near 100, eat extra protein, keep dressing dry.  Person educated: Patient and Caregiver   Education method: Explanation Education comprehension: verbalized understanding   HOME EXERCISE PROGRAM: Self manual   GOALS: Goals reviewed with patient? No  SHORT TERM GOALS: Target date: 01/30/2023  Wound to be 100% granulated Baseline: Goal status: INITIAL  2.  Pain to be no greater than 5/10 Baseline:  Goal status: INITIAL   LONG TERM GOALS: Target date: 02/27/2023  Wound size to be no more than 2x2 cm  Baseline:  Goal status: INITIAL  2.  Wound to have  no undermining Baseline:  Goal status: INITIAL  3.  Depth to be no greater than 1 cm  Baseline:  Goal status: INITIAL  4.  Pain to be no greater than a 2/10 Baseline:  Goal status: INITIAL  5.  Aide to be confident in continued care of wound  Baseline:  Goal status: INITIAL    ASSESSMENT:  CLINICAL IMPRESSION: Pt arrived with dressings intact, reports she has been changing daily.  Stated she has been unable to find medipore tape at home, therapist gave her a small roll for skin integrity.  Overall primary wound has improved with granulation tissue following debridement for removal of slough from wound bed.  Pt continues to have induration surrounding wound though has reduced some compared to last session, encouraged pt to continue massaging daily.  Changed dressings to xeroform for increased moisturization as wound bed was dry.  Lower wound was cleansed well and packed with medihoney, no selective debridement complete this  session.  Patient is a 57 y.o. female who was seen today for physical therapy evaluation and treatment for  Diagnosis Description  non healing wound abdominal wound  PMH  affecting wound healing includes Mhistory of insulin-dependent type 2 diabetes, bipolar 1 disorder, COPD, ESRD on hemodialysis TTS, nonalcoholic cirrhosis on dialysis, sleep apnea on CPAP, hypertension, restless legs .  Pt has had a progressive abdominal wound and is now being referred to skilled PT for treatment.  Martha [name] will benefit from skilled PT to ensure proper wound healing is occurring as the pt is legally blind.    OBJECTIVE IMPAIRMENTS: obesity, pain, and decreased skin integrity .   ACTIVITY LIMITATIONS: dressing and hygiene/grooming  REHAB POTENTIAL: Good  CLINICAL DECISION MAKING: Stable/uncomplicated  EVALUATION COMPLEXITY: Moderate  PLAN: PT FREQUENCY: 2x/week  PT DURATION: 6 weeks  PLANNED INTERVENTIONS: Patient/Family education, Self Care, Manual therapy, and debridement and dressing change  PLAN FOR NEXT SESSION:  Continue with debridement and dressing change.  PT IS LEGALLY BLIND AND NEEDS NAVIGATIONAL ASSISTANCE TO GET TO WOUND ROOM*  Becky Sax, LPTA/CLT; CBIS [phone]  Juel Burrow, PTA 02/13/2023, 1:54 PM   02/13/2023, 1:54 PM

## 2023-02-16 ENCOUNTER — Ambulatory Visit (HOSPITAL_COMMUNITY): Payer: Medicaid Other | Admitting: Physical Therapy

## 2023-02-16 DIAGNOSIS — L089 Local infection of the skin and subcutaneous tissue, unspecified: Secondary | ICD-10-CM

## 2023-02-16 DIAGNOSIS — R1030 Lower abdominal pain, unspecified: Secondary | ICD-10-CM

## 2023-02-16 DIAGNOSIS — S31109S Unspecified open wound of abdominal wall, unspecified quadrant without penetration into peritoneal cavity, sequela: Secondary | ICD-10-CM | POA: Diagnosis not present

## 2023-02-16 NOTE — Therapy (Signed)
OUTPATIENT PHYSICAL THERAPY Wound Treatment   Patient Name: Martha [name] MRN: [id number] DOB:09-01-1965, 57 y.o., female Today's Date: 02/16/2023   PCP: [facility] REFERRING PROVIDER: Hyler, Janine Limbo, NP  END OF SESSION:  PT End of Session - 02/16/23 1541     Visit Number 7    Number of Visits 12    Date for PT Re-Evaluation 02/27/23    Authorization Type medicaid    Authorization Time Period 12 visits approved from 7/8-->03/15/23    Authorization - Visit Number 3    Authorization - Number of Visits 12    Progress Note Due on Visit 17    PT Start Time 1440    PT Stop Time 1505    PT Time Calculation (min) 25 min    Activity Tolerance Patient tolerated treatment well    Behavior During Therapy WFL for tasks assessed/performed             Past Medical History:  Diagnosis Date   Acid reflux    takes Tums   Anemia    Arthritis    Bipolar 1 disorder (HCC)    Blindness of left eye    left eye removed   Carpal tunnel syndrome, bilateral    Cervical radiculopathy    CHF (congestive heart failure) (HCC)    Chronic kidney disease    Stage 5- 01/25/17   Constipation    COPD (chronic obstructive pulmonary disease) (HCC) 2014   bronchitis   Degenerative disc disease, thoracic    Depression    Diabetes mellitus    Type II   Diabetic retinopathy (HCC)    Dyspnea    when walking   End stage kidney disease (HCC)    M, W, F [facility]   Gout    Hypertension    Non-alcoholic cirrhosis (HCC)    Noncompliance with medication regimen    Noncompliance with medication regimen    Obesity (BMI 30-39.9)    OSA (obstructive sleep apnea)    cpap   Panic attack    RLS (restless legs syndrome)    Past Surgical History:  Procedure Laterality Date   AORTIC ARCH ANGIOGRAPHY N/A 09/11/2020   Procedure: AORTIC ARCH ANGIOGRAPHY;  Surgeon: Nada Libman, MD;  Location: [facility];  Service: Cardiovascular;  Laterality: N/A;   AV FISTULA PLACEMENT Right 01/27/2017    Procedure: ARTERIOVENOUS (AV) FISTULA CREATION-RIGHT ARM;  Surgeon: Sherren Kerns, MD;  Location: [facility] OR;  Service: Vascular;  Laterality: Right;   AV FISTULA PLACEMENT Left 08/03/2018   Procedure: LEFT ARTERIOVENOUS (AV) FISTULA CREATION;  Surgeon: Sherren Kerns, MD;  Location: [facility] OR;  Service: Vascular;  Laterality: Left;   BASCILIC VEIN TRANSPOSITION Left 04/26/2019   Procedure: SECOND STAGE BASILIC VEIN TRANSPOSITION LEFT ARM;  Surgeon: Sherren Kerns, MD;  Location[facility] OR;  Service: Vascular;  Laterality: Left;   BIOPSY N/A 05/17/2013   Procedure: BIOPSY;  Surgeon: West Bali, MD;  Location: [facility]S;  Service: Endoscopy;  Laterality: N/A;   CARPAL TUNNEL RELEASE Left 11/25/2018   Procedure: LEFT CARPAL TUNNEL RELEASE;  Surgeon: Dairl Ponder, MD;  Location: [facility] OR;  Service: Orthopedics;  Laterality: Left;   CATARACT EXTRACTION Right    CHOLECYSTECTOMY N/A 04/27/2014   Procedure: LAPAROSCOPIC CHOLECYSTECTOMY;  Surgeon: Marlane Hatcher, MD;  Location: [facility];  Service: General;  Laterality: N/A;   COLONOSCOPY  06/06/2010   AOZ:HYQMVH bleeding secondary to internal hemorrhoids but incomplete evaluation secondary to poor right colon  prep/small rectal and sigmoid colon polyps (hyperplastic). PROPOFOL   COLONOSCOPY  May 2013   Dr. Gilliam/[facility]: 5 mm a descending colon polyp, hyperplastic. Adequate bowel prep.   ENDOMETRIAL ABLATION     ENUCLEATION Left 02/04/2018   ENUCLEATION WITH PLACEMENT OF IMPLANT LEFT EYE   ENUCLEATION Left 02/04/2018   Procedure: ENUCLEATION WITH PLACEMENT OF IMPLANT LEFT EYE;  Surgeon: Floydene Flock, MD;  Location: [facility] OR;  Service: Ophthalmology;  Laterality: Left;   ESOPHAGOGASTRODUODENOSCOPY  06/06/2010   BMW:UXLKGMW erythema and edema of body of stomach, with sessile polypoid lesions. bx benign. no h.pylori   ESOPHAGOGASTRODUODENOSCOPY (EGD) WITH PROPOFOL N/A 05/17/2013   Dr. Darrick Penna: normal esophagus, moderate nodular gastritis, negative path,  empiric Savary dilation   EYE SURGERY  07/2017   sx for glaucoma   FLEXIBLE SIGMOIDOSCOPY N/A 05/17/2013   Dr. Darrick Penna: moderate sized internal hemorrhoids   HEMORRHOID BANDING N/A 05/17/2013   Procedure: HEMORRHOID BANDING;  Surgeon: West Bali, MD;  Location: [facility];  Service: Endoscopy;  Laterality: N/A;  2 bands placed   INCISION AND DRAINAGE ABSCESS Left 10/11/2013   Procedure: INCISION AND DRAINAGE AND DEBRIDEMENT LEFT BREAST  ABSCESS;  Surgeon: Marlane Hatcher, MD;  Location: [facility];  Service: General;  Laterality: Left;   INSERTION OF DIALYSIS CATHETER N/A 06/08/2018   Procedure: INSERTION OF Right internal jugular TUNNELED  DIALYSIS CATHETER;  Surgeon: Chuck Hint, MD;  Location: [facility] OR;  Service: Vascular;  Laterality: N/A;   IRRIGATION AND DEBRIDEMENT ABSCESS Right 06/01/2013   Procedure: INCISION AND DRAINAGE AND DEBRIDEMENT ABSCESS RIGHT BREAST;  Surgeon: Marlane Hatcher, MD;  Location: [facility];  Service: General;  Laterality: Right;   LEFT EYE REMOVED Left 01/2018   [facility] [address].   LIGATION OF ARTERIOVENOUS  FISTULA Right 06/08/2018   Procedure: LIGATION OF ARTERIOVENOUS  FISTULA RIGHT ARM;  Surgeon: Chuck Hint, MD;  Location: [facility] OR;  Service: Vascular;  Laterality: Right;   ORIF ANKLE FRACTURE Right 12/17/2018   Procedure: OPEN REDUCTION INTERNAL FIXATION (ORIF) ANKLE FRACTURE;  Surgeon: Felecia Shelling, DPM;  Location: [facility] OR;  Service: Podiatry;  Laterality: Right;   REVISON OF ARTERIOVENOUS FISTULA Left 09/26/2020   Procedure: LEFT ARTERIOVENOUS FISTULA  DISTAL REVASCULARIZATION AND LIGATION ( DRIL ) USING GREATER SAPHENOUS VEIN;  Surgeon: Maeola Harman, MD;  Location: [facility] OR;  Service: Vascular;  Laterality: Left;   SAVORY DILATION N/A 05/17/2013   Procedure: SAVORY DILATION;  Surgeon: West Bali, MD;  Location: [facility];  Service: Endoscopy;  Laterality: N/A;  14/15/16   SKIN FULL THICKNESS GRAFT Left 05/05/2018    Procedure: ABDOMINAL DERMIS FAT SKIN GRAFT FULL THICKNESS LEFT EYE;  Surgeon: Floydene Flock, MD;  Location: [facility] OR;  Service: Ophthalmology;  Laterality: Left;   TUBAL LIGATION     VEIN HARVEST Left 09/26/2020   Procedure: LEFT LEG GREATER SAPHENOUS VEIN HARVEST;  Surgeon: Maeola Harman, MD;  Location: [facility] OR;  Service: Vascular;  Laterality: Left;   Patient Active Problem List   Diagnosis Date Noted   Opioid dependence (HCC) 10/18/2021   Cellulitis and abscess of hand 10/18/2021   Left hand pain 10/17/2021   Cellulitis of left hand 10/17/2021   ESRD (end stage renal disease) (HCC) 09/26/2020   Hypoalbuminemia 07/06/2020   Acute respiratory failure with hypoxia (HCC) 07/06/2020   CAP (community acquired pneumonia) 07/05/2020   Trimalleolar fracture of ankle, closed, right, initial encounter 12/17/2018   Pain and swelling of ankle,  right 12/16/2018   Metabolic encephalopathy 09/26/2018   SIRS (systemic inflammatory response syndrome) (HCC) 06/23/2018   UTI (urinary tract infection) 02/26/2018   Volume overload 02/25/2018   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   Dialysis patient (HCC) 12/13/2017   ESRD (end stage renal disease) on dialysis (HCC)    Chronic diarrhea 10/08/2017   Anemia in chronic kidney disease 05/24/2017   Chronic obstructive pulmonary disease (HCC) 05/24/2017   Heart failure (HCC) 05/24/2017   Osteopathy in diseases classified elsewhere, unspecified site 05/24/2017   Renal failure (ARF), acute on chronic (HCC) 04/22/2017   Anasarca associated with disorder of kidney 04/20/2017   Protein-calorie malnutrition, severe 11/29/2016   HTN (hypertension), malignant 11/20/2016   Bipolar 1 disorder (HCC) 11/20/2016   Noncompliance with medication regimen 11/20/2016   Panic attack 11/20/2016   Controlled type 2 diabetes mellitus without complication, without long-term current use of insulin (HCC) 10/27/2016   Sensory disturbance 10/27/2016    Left hemiparesis (HCC) 10/27/2016   Cholelithiasis 04/26/2014   Hyponatremia 04/26/2014   Gallstones 04/06/2014   Diabetic ulcer of left great toe (HCC) 10/09/2013   Type 2 diabetes mellitus treated with insulin (HCC) 05/30/2013   Hyperglycemia due to diabetes mellitus (HCC) 05/30/2013   Internal hemorrhoids with other complication 04/28/2013   Umbilical hernia without mention of obstruction or gangrene 04/15/2013   Anemia 04/15/2013   Obesity, Class III, BMI 40-49.9 (morbid obesity) (HCC) 10/08/2012   DM 05/13/2010   Hyperlipidemia 05/13/2010   Morbid obesity (HCC) 05/13/2010   Depression with anxiety 05/13/2010   RESTLESS LEG SYNDROME 05/13/2010   Essential hypertension 05/13/2010   GERD 05/13/2010   RECTAL BLEEDING 05/13/2010   OSA on CPAP 05/13/2010   Dysphagia, oropharyngeal phase 05/13/2010    ONSET DATE: 11/25/2022  REFERRING DIAG: abdominal wound   THERAPY DIAG:  Chronic abdominal wound infection, sequela  Lower abdominal pain  Rationale for Evaluation and Treatment: Rehabilitation     Wound Therapy - 02/16/23 1542     Subjective pt states she has 3-4 more days of antibiotics.    Patient and Family Stated Goals wound to be healed    Date of Onset 11/25/22    Pain Scale 0-10    Pain Score 0-No pain    Pain Location Abdomen    Evaluation and Treatment Procedures Explained to Patient/Family Yes    Evaluation and Treatment Procedures agreed to    Wound Properties Date First Assessed: 02/06/23 Time First Assessed: 1100 Wound Type: Incision - Open Location: Abdomen Location Orientation: Left;Lower;Medial   Wound Image Images linked: 1    Dressing Type Gauze (Comment);Honey;Tape dressing   medihoney, 2x2, ABD pad with medipore tape   Dressing Changed Changed    Dressing Status Old drainage    Dressing Change Frequency PRN    Site / Wound Assessment Yellow;Pink    % Wound base Red or Granulating 25%    % Wound base Yellow/Fibrinous Exudate 75%    Peri-wound  Assessment Edema;Induration    Wound Length (cm) 0.8 cm    Wound Width (cm) 1.9 cm    Wound Depth (cm) 2 cm    Wound Volume (cm^3) 3.04 cm^3    Wound Surface Area (cm^2) 1.52 cm^2    Drainage Amount Minimal    Drainage Description Serosanguineous    Treatment Cleansed;Debridement (Selective)    Wound Properties Date First Assessed: 01/16/23 Time First Assessed: 1530 Location: Abdomen Location Orientation: Lower;Medial;Right Wound Description (Comments): Rt lower abdominal Present on Admission: Yes  Wound Image Images linked: 1    Dressing Type Tape dressing;Gauze (Comment);Honey   xeroform, vaseline perimeter, 2x2, ABD pad with medipore tape   Dressing Changed Changed    Dressing Status Old drainage    Dressing Change Frequency PRN    Site / Wound Assessment Pink;Yellow    % Wound base Red or Granulating 85%    % Wound base Yellow/Fibrinous Exudate 15%    Peri-wound Assessment Edema;Induration    Wound Length (cm) 1.8 cm    Wound Width (cm) 2 cm    Wound Depth (cm) 0.8 cm    Wound Volume (cm^3) 2.88 cm^3    Wound Surface Area (cm^2) 3.6 cm^2    Drainage Amount Minimal    Drainage Description Serosanguineous    Treatment Cleansed;Debridement (Selective)    Selective Debridement (non-excisional) - Location wound bed    Selective Debridement (non-excisional) - Tools Used Forceps    Selective Debridement (non-excisional) - Tissue Removed devitalized tissue    Wound Therapy - Clinical Statement see below    Wound Therapy - Functional Problem List difficulty with dressing , cleansing    Factors Delaying/Impairing Wound Healing Altered sensation;Diabetes Mellitus;Infection - systemic/local;Polypharmacy;Vascular compromise    Hydrotherapy Plan Debridement;Dressing change;Patient/family education    Wound Therapy - Frequency 2X / week    Wound Therapy - Current Recommendations PT    Wound Plan see TIW x 6 weeks to ensure proper healing is occuring    Dressing  superior: xeroform,  vaseline, 2x2, abd pad, medipore tape    Dressing Inferior: packed with medihoney, 2x2, ABD pad with medipore tape                 PATIENT EDUCATION: Education details: self massage to indurated area surrounding the wound, the importance of keeping her sugar level near 100, eat extra protein, keep dressing dry.  Person educated: Patient and Caregiver   Education method: Explanation Education comprehension: verbalized understanding   HOME EXERCISE PROGRAM: Self manual   GOALS: Goals reviewed with patient? No  SHORT TERM GOALS: Target date: 01/30/2023  Wound to be 100% granulated Baseline: Goal status: INITIAL  2.  Pain to be no greater than 5/10 Baseline:  Goal status: INITIAL   LONG TERM GOALS: Target date: 02/27/2023  Wound size to be no more than 2x2 cm  Baseline:  Goal status: INITIAL  2.  Wound to have  no undermining Baseline:  Goal status: INITIAL  3.  Depth to be no greater than 1 cm  Baseline:  Goal status: INITIAL  4.  Pain to be no greater than a 2/10 Baseline:  Goal status: INITIAL  5.  Aide to be confident in continued care of wound  Baseline:  Goal status: INITIAL    ASSESSMENT:  CLINICAL IMPRESSION: Pt arrived with dressings intact. Much improved today compared to last weeks measurements and photographs.  Both wounds have approximated with less depth measured.  Increased granulation and less sensitivity to debridement today.  Pt continues to have induration surrounding wound though has reduced some than was originally with reports she is massaging daily.  Pt will continue to benefit from therapy in order to fully heal wounds.  Patient is a 57 y.o. female who was seen today for physical therapy evaluation and treatment for  Diagnosis Description  non healing wound abdominal wound  PMH affecting wound healing includes Mhistory of insulin-dependent type 2 diabetes, bipolar 1 disorder, COPD, ESRD on hemodialysis TTS, nonalcoholic cirrhosis  on dialysis, sleep apnea on CPAP, hypertension,  restless legs .  Pt has had a progressive abdominal wound and is now being referred to skilled PT for treatment.  Ms. Mackowiak will benefit from skilled PT to ensure proper wound healing is occurring as the pt is legally blind.    OBJECTIVE IMPAIRMENTS: obesity, pain, and decreased skin integrity .   ACTIVITY LIMITATIONS: dressing and hygiene/grooming  REHAB POTENTIAL: Good  CLINICAL DECISION MAKING: Stable/uncomplicated  EVALUATION COMPLEXITY: Moderate  PLAN: PT FREQUENCY: 2x/week  PT DURATION: 6 weeks  PLANNED INTERVENTIONS: Patient/Family education, Self Care, Manual therapy, and debridement and dressing change  PLAN FOR NEXT SESSION:  Continue with debridement and dressing change.  PT IS LEGALLY BLIND AND NEEDS NAVIGATIONAL ASSISTANCE TO GET TO WOUND ROOMBascom Levels, Maika Mcelveen B, PTA 02/16/2023, 3:57 PM   02/16/2023, 3:57 PM

## 2023-02-18 ENCOUNTER — Encounter (HOSPITAL_COMMUNITY): Payer: Self-pay

## 2023-02-18 ENCOUNTER — Ambulatory Visit (HOSPITAL_COMMUNITY): Payer: Medicaid Other

## 2023-02-18 DIAGNOSIS — R1030 Lower abdominal pain, unspecified: Secondary | ICD-10-CM

## 2023-02-18 DIAGNOSIS — S31109S Unspecified open wound of abdominal wall, unspecified quadrant without penetration into peritoneal cavity, sequela: Secondary | ICD-10-CM | POA: Diagnosis not present

## 2023-02-18 DIAGNOSIS — L089 Local infection of the skin and subcutaneous tissue, unspecified: Secondary | ICD-10-CM

## 2023-02-18 NOTE — Therapy (Signed)
OUTPATIENT PHYSICAL THERAPY Wound Treatment   Patient Name: Martha [name] MRN: [id number] DOB:[birth year], 57 y.o., female Today's Date: 02/18/2023   PCP: [facility] REFERRING PROVIDER: Hyler, Janine Limbo, NP  END OF SESSION:  PT End of Session - 02/18/23 1452     Visit Number 8    Number of Visits 12    Authorization Type medicaid    Authorization Time Period 12 visits approved from 7/8-->03/15/23    Authorization - Visit Number 4    Authorization - Number of Visits 12    Progress Note Due on Visit 17    PT Start Time 1450    PT Stop Time 1515    PT Time Calculation (min) 25 min    Activity Tolerance Patient tolerated treatment well    Behavior During Therapy WFL for tasks assessed/performed             Past Medical History:  Diagnosis Date   Acid reflux    takes Tums   Anemia    Arthritis    Bipolar 1 disorder (HCC)    Blindness of left eye    left eye removed   Carpal tunnel syndrome, bilateral    Cervical radiculopathy    CHF (congestive heart failure) (HCC)    Chronic kidney disease    Stage 5- 01/25/17   Constipation    COPD (chronic obstructive pulmonary disease) (HCC) 2014   bronchitis   Degenerative disc disease, thoracic    Depression    Diabetes mellitus    Type II   Diabetic retinopathy (HCC)    Dyspnea    when walking   End stage kidney disease (HCC)    M, W, F [facility]   Gout    Hypertension    Non-alcoholic cirrhosis (HCC)    Noncompliance with medication regimen    Noncompliance with medication regimen    Obesity (BMI 30-39.9)    OSA (obstructive sleep apnea)    cpap   Panic attack    RLS (restless legs syndrome)    Past Surgical History:  Procedure Laterality Date   AORTIC ARCH ANGIOGRAPHY N/A 09/11/2020   Procedure: AORTIC ARCH ANGIOGRAPHY;  Surgeon: Nada Libman, MD;  Location: [facility];  Service: Cardiovascular;  Laterality: N/A;   AV FISTULA PLACEMENT Right 01/27/2017   Procedure: ARTERIOVENOUS (AV) FISTULA  CREATION-RIGHT ARM;  Surgeon: Sherren Kerns, MD;  Location: [facility] OR;  Service: Vascular;  Laterality: Right;   AV FISTULA PLACEMENT Left 08/03/2018   Procedure: LEFT ARTERIOVENOUS (AV) FISTULA CREATION;  Surgeon: Sherren Kerns, MD;  Location: [facility] OR;  Service: Vascular;  Laterality: Left;   BASCILIC VEIN TRANSPOSITION Left 04/26/2019   Procedure: SECOND STAGE BASILIC VEIN TRANSPOSITION LEFT ARM;  Surgeon: Sherren Kerns, MD;  Location[facility] OR;  Service: Vascular;  Laterality: Left;   BIOPSY N/A 05/17/2013   Procedure: BIOPSY;  Surgeon: West Bali, MD;  Location: [facility]S;  Service: Endoscopy;  Laterality: N/A;   CARPAL TUNNEL RELEASE Left 11/25/2018   Procedure: LEFT CARPAL TUNNEL RELEASE;  Surgeon: Dairl Ponder, MD;  Location: [facility] OR;  Service: Orthopedics;  Laterality: Left;   CATARACT EXTRACTION Right    CHOLECYSTECTOMY N/A 04/27/2014   Procedure: LAPAROSCOPIC CHOLECYSTECTOMY;  Surgeon: Marlane Hatcher, MD;  Location: [facility];  Service: General;  Laterality: N/A;   COLONOSCOPY  06/06/2010   WUJ:WJXBJY bleeding secondary to internal hemorrhoids but incomplete evaluation secondary to poor right colon prep/small rectal and sigmoid colon polyps (hyperplastic). PROPOFOL  COLONOSCOPY  May 2013   Dr. Gilliam/[facility]: 5 mm a descending colon polyp, hyperplastic. Adequate bowel prep.   ENDOMETRIAL ABLATION     ENUCLEATION Left 02/04/2018   ENUCLEATION WITH PLACEMENT OF IMPLANT LEFT EYE   ENUCLEATION Left 02/04/2018   Procedure: ENUCLEATION WITH PLACEMENT OF IMPLANT LEFT EYE;  Surgeon: Floydene Flock, MD;  Location: [facility] OR;  Service: Ophthalmology;  Laterality: Left;   ESOPHAGOGASTRODUODENOSCOPY  06/06/2010   ZOX:WRUEAVW erythema and edema of body of stomach, with sessile polypoid lesions. bx benign. no h.pylori   ESOPHAGOGASTRODUODENOSCOPY (EGD) WITH PROPOFOL N/A 05/17/2013   Dr. Darrick Penna: normal esophagus, moderate nodular gastritis, negative path, empiric Savary dilation   EYE SURGERY   07/2017   sx for glaucoma   FLEXIBLE SIGMOIDOSCOPY N/A 05/17/2013   Dr. Darrick Penna: moderate sized internal hemorrhoids   HEMORRHOID BANDING N/A 05/17/2013   Procedure: HEMORRHOID BANDING;  Surgeon: West Bali, MD;  Location: [facility];  Service: Endoscopy;  Laterality: N/A;  2 bands placed   INCISION AND DRAINAGE ABSCESS Left 10/11/2013   Procedure: INCISION AND DRAINAGE AND DEBRIDEMENT LEFT BREAST  ABSCESS;  Surgeon: Marlane Hatcher, MD;  Location: [facility];  Service: General;  Laterality: Left;   INSERTION OF DIALYSIS CATHETER N/A 06/08/2018   Procedure: INSERTION OF Right internal jugular TUNNELED  DIALYSIS CATHETER;  Surgeon: Chuck Hint, MD;  Location: [facility] OR;  Service: Vascular;  Laterality: N/A;   IRRIGATION AND DEBRIDEMENT ABSCESS Right 06/01/2013   Procedure: INCISION AND DRAINAGE AND DEBRIDEMENT ABSCESS RIGHT BREAST;  Surgeon: Marlane Hatcher, MD;  Location: [facility];  Service: General;  Laterality: Right;   LEFT EYE REMOVED Left 01/2018   [facility] [address].   LIGATION OF ARTERIOVENOUS  FISTULA Right 06/08/2018   Procedure: LIGATION OF ARTERIOVENOUS  FISTULA RIGHT ARM;  Surgeon: Chuck Hint, MD;  Location: [facility] OR;  Service: Vascular;  Laterality: Right;   ORIF ANKLE FRACTURE Right 12/17/2018   Procedure: OPEN REDUCTION INTERNAL FIXATION (ORIF) ANKLE FRACTURE;  Surgeon: Felecia Shelling, DPM;  Location: [facility] OR;  Service: Podiatry;  Laterality: Right;   REVISON OF ARTERIOVENOUS FISTULA Left 09/26/2020   Procedure: LEFT ARTERIOVENOUS FISTULA  DISTAL REVASCULARIZATION AND LIGATION ( DRIL ) USING GREATER SAPHENOUS VEIN;  Surgeon: Maeola Harman, MD;  Location: [facility] OR;  Service: Vascular;  Laterality: Left;   SAVORY DILATION N/A 05/17/2013   Procedure: SAVORY DILATION;  Surgeon: West Bali, MD;  Location: [facility];  Service: Endoscopy;  Laterality: N/A;  14/15/16   SKIN FULL THICKNESS GRAFT Left 05/05/2018   Procedure: ABDOMINAL DERMIS FAT SKIN  GRAFT FULL THICKNESS LEFT EYE;  Surgeon: Floydene Flock, MD;  Location: [facility] OR;  Service: Ophthalmology;  Laterality: Left;   TUBAL LIGATION     VEIN HARVEST Left 09/26/2020   Procedure: LEFT LEG GREATER SAPHENOUS VEIN HARVEST;  Surgeon: Maeola Harman, MD;  Location: [facility] OR;  Service: Vascular;  Laterality: Left;   Patient Active Problem List   Diagnosis Date Noted   Opioid dependence (HCC) 10/18/2021   Cellulitis and abscess of hand 10/18/2021   Left hand pain 10/17/2021   Cellulitis of left hand 10/17/2021   ESRD (end stage renal disease) (HCC) 09/26/2020   Hypoalbuminemia 07/06/2020   Acute respiratory failure with hypoxia (HCC) 07/06/2020   CAP (community acquired pneumonia) 07/05/2020   Trimalleolar fracture of ankle, closed, right, initial encounter 12/17/2018   Pain and swelling of ankle, right 12/16/2018   Metabolic encephalopathy 09/26/2018   SIRS (  systemic inflammatory response syndrome) (HCC) 06/23/2018   UTI (urinary tract infection) 02/26/2018   Volume overload 02/25/2018   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   Dialysis patient (HCC) 12/13/2017   ESRD (end stage renal disease) on dialysis (HCC)    Chronic diarrhea 10/08/2017   Anemia in chronic kidney disease 05/24/2017   Chronic obstructive pulmonary disease (HCC) 05/24/2017   Heart failure (HCC) 05/24/2017   Osteopathy in diseases classified elsewhere, unspecified site 05/24/2017   Renal failure (ARF), acute on chronic (HCC) 04/22/2017   Anasarca associated with disorder of kidney 04/20/2017   Protein-calorie malnutrition, severe 11/29/2016   HTN (hypertension), malignant 11/20/2016   Bipolar 1 disorder (HCC) 11/20/2016   Noncompliance with medication regimen 11/20/2016   Panic attack 11/20/2016   Controlled type 2 diabetes mellitus without complication, without long-term current use of insulin (HCC) 10/27/2016   Sensory disturbance 10/27/2016   Left hemiparesis (HCC) 10/27/2016    Cholelithiasis 04/26/2014   Hyponatremia 04/26/2014   Gallstones 04/06/2014   Diabetic ulcer of left great toe (HCC) 10/09/2013   Type 2 diabetes mellitus treated with insulin (HCC) 05/30/2013   Hyperglycemia due to diabetes mellitus (HCC) 05/30/2013   Internal hemorrhoids with other complication 04/28/2013   Umbilical hernia without mention of obstruction or gangrene 04/15/2013   Anemia 04/15/2013   Obesity, Class III, BMI 40-49.9 (morbid obesity) (HCC) 10/08/2012   DM 05/13/2010   Hyperlipidemia 05/13/2010   Morbid obesity (HCC) 05/13/2010   Depression with anxiety 05/13/2010   RESTLESS LEG SYNDROME 05/13/2010   Essential hypertension 05/13/2010   GERD 05/13/2010   RECTAL BLEEDING 05/13/2010   OSA on CPAP 05/13/2010   Dysphagia, oropharyngeal phase 05/13/2010    ONSET DATE: 11/25/2022  REFERRING DIAG: abdominal wound   THERAPY DIAG:  Chronic abdominal wound infection, sequela  Lower abdominal pain  Rationale for Evaluation and Treatment: Rehabilitation     Wound Therapy - 02/18/23 0001     Subjective Pt stated a little pain.  Arrived with new dressings, stated she took her first shower in weeks prior apt today, felt so good.  Continues antibiotic until Saturday.    Patient and Family Stated Goals wound to be healed    Date of Onset 11/25/22    Pain Scale 0-10    Pain Score 3     Pain Location Abdomen    Pain Orientation Lower    Pain Descriptors / Indicators Sore;Sharp    Pain Onset On-going    Patients Stated Pain Goal 2    Evaluation and Treatment Procedures Explained to Patient/Family Yes    Evaluation and Treatment Procedures agreed to    Wound Properties Date First Assessed: 02/06/23 Time First Assessed: 1100 Wound Type: Incision - Open Location: Abdomen Location Orientation: Left;Lower;Medial   Wound Image Images linked: 1    Dressing Type Gauze (Comment);Honey;Tape dressing    Dressing Changed Changed    Dressing Status Old drainage    Dressing Change  Frequency PRN    Site / Wound Assessment Yellow;Pink    % Wound base Red or Granulating 25%    % Wound base Yellow/Fibrinous Exudate 75%    Peri-wound Assessment Edema;Induration    Wound Length (cm) 0.8 cm    Wound Width (cm) 1.9 cm    Wound Depth (cm) 2.6 cm    Wound Volume (cm^3) 3.95 cm^3    Wound Surface Area (cm^2) 1.52 cm^2    Drainage Amount Minimal    Drainage Description Serosanguineous    Treatment Cleansed  Wound Properties Date First Assessed: 01/16/23 Time First Assessed: 1530 Location: Abdomen Location Orientation: Lower;Medial;Right Wound Description (Comments): Rt lower abdominal Present on Admission: Yes   Wound Image Images linked: 1    Dressing Type Tape dressing;Gauze (Comment);Impregnated gauze (bismuth)   superior: xeroform, vaseline, 2x2, abd pad, medipore tape   Dressing Changed Changed    Dressing Status Old drainage    Dressing Change Frequency PRN    Site / Wound Assessment Pink;Yellow    % Wound base Red or Granulating 90%    % Wound base Yellow/Fibrinous Exudate 10%    Peri-wound Assessment Edema;Induration    Wound Length (cm) 1.8 cm    Wound Width (cm) 1.6 cm    Wound Depth (cm) 0.8 cm    Wound Volume (cm^3) 2.3 cm^3    Wound Surface Area (cm^2) 2.88 cm^2    Drainage Amount Minimal    Drainage Description Serosanguineous    Treatment Cleansed;Debridement (Selective)    Selective Debridement (non-excisional) - Location wound bed    Selective Debridement (non-excisional) - Tools Used Forceps    Selective Debridement (non-excisional) - Tissue Removed devitalized tissue    Wound Therapy - Clinical Statement see below    Wound Therapy - Functional Problem List difficulty with dressing , cleansing    Factors Delaying/Impairing Wound Healing Altered sensation;Diabetes Mellitus;Infection - systemic/local;Polypharmacy;Vascular compromise    Hydrotherapy Plan Debridement;Dressing change;Patient/family education    Wound Therapy - Frequency 2X / week     Wound Therapy - Current Recommendations PT    Wound Plan see TIW x 6 weeks to ensure proper healing is occuring    Dressing  superior: xeroform, vaseline, 2x2, abd pad, medipore tape    Dressing Inferior: packed with medihoney, 2x2, ABD pad with medipore tape                 PATIENT EDUCATION: Education details: self massage to indurated area surrounding the wound, the importance of keeping her sugar level near 100, eat extra protein, keep dressing dry.  Person educated: Patient and Caregiver   Education method: Explanation Education comprehension: verbalized understanding   HOME EXERCISE PROGRAM: Self manual   GOALS: Goals reviewed with patient? No  SHORT TERM GOALS: Target date: 01/30/2023  Wound to be 100% granulated Baseline: Goal status: INITIAL  2.  Pain to be no greater than 5/10 Baseline:  Goal status: INITIAL   LONG TERM GOALS: Target date: 02/27/2023  Wound size to be no more than 2x2 cm  Baseline:  Goal status: INITIAL  2.  Wound to have  no undermining Baseline:  Goal status: INITIAL  3.  Depth to be no greater than 1 cm  Baseline:  Goal status: INITIAL  4.  Pain to be no greater than a 2/10 Baseline:  Goal status: INITIAL  5.  Aide to be confident in continued care of wound  Baseline:  Goal status: INITIAL    ASSESSMENT:  CLINICAL IMPRESSION: Selective debridement for removal of slough from wound bed.  Wound is progressing well with scant drainage and improved approximation.  Does continues to have hard induratin surrounding wound that has reduced some compared to last week, pt stated she is massaging daily.   Patient is a 57 y.o. female who was seen today for physical therapy evaluation and treatment for  Diagnosis Description  non healing wound abdominal wound  PMH affecting wound healing includes Mhistory of insulin-dependent type 2 diabetes, bipolar 1 disorder, COPD, ESRD on hemodialysis TTS, nonalcoholic cirrhosis on dialysis,  sleep  apnea on CPAP, hypertension, restless legs .  Pt has had a progressive abdominal wound and is now being referred to skilled PT for treatment.  Martha [name] will benefit from skilled PT to ensure proper wound healing is occurring as the pt is legally blind.    OBJECTIVE IMPAIRMENTS: obesity, pain, and decreased skin integrity .   ACTIVITY LIMITATIONS: dressing and hygiene/grooming  REHAB POTENTIAL: Good  CLINICAL DECISION MAKING: Stable/uncomplicated  EVALUATION COMPLEXITY: Moderate  PLAN: PT FREQUENCY: 2x/week  PT DURATION: 6 weeks  PLANNED INTERVENTIONS: Patient/Family education, Self Care, Manual therapy, and debridement and dressing change  PLAN FOR NEXT SESSION:  Continue with debridement and dressing change.  PT IS LEGALLY BLIND AND NEEDS NAVIGATIONAL ASSISTANCE TO GET TO WOUND ROOM*  Becky Sax, LPTA/CLT; CBIS [phone]  Juel Burrow, PTA 02/18/2023, 2:54 PM   02/18/2023, 2:54 PM

## 2023-02-20 ENCOUNTER — Ambulatory Visit (HOSPITAL_COMMUNITY): Payer: Medicaid Other | Admitting: Physical Therapy

## 2023-02-23 ENCOUNTER — Ambulatory Visit (HOSPITAL_COMMUNITY): Payer: Medicaid Other

## 2023-02-23 ENCOUNTER — Ambulatory Visit (HOSPITAL_COMMUNITY): Payer: Medicaid Other | Admitting: Physical Therapy

## 2023-02-25 ENCOUNTER — Ambulatory Visit (HOSPITAL_COMMUNITY): Payer: Medicaid Other

## 2023-02-25 ENCOUNTER — Encounter (HOSPITAL_COMMUNITY): Payer: Self-pay

## 2023-02-25 ENCOUNTER — Ambulatory Visit (HOSPITAL_COMMUNITY): Payer: Medicaid Other | Admitting: Physical Therapy

## 2023-02-25 DIAGNOSIS — S31109S Unspecified open wound of abdominal wall, unspecified quadrant without penetration into peritoneal cavity, sequela: Secondary | ICD-10-CM | POA: Diagnosis not present

## 2023-02-25 DIAGNOSIS — R1030 Lower abdominal pain, unspecified: Secondary | ICD-10-CM

## 2023-02-25 DIAGNOSIS — L089 Local infection of the skin and subcutaneous tissue, unspecified: Secondary | ICD-10-CM

## 2023-02-25 NOTE — Therapy (Signed)
OUTPATIENT PHYSICAL THERAPY Wound Treatment   Patient Name: Martha [name] MRN: [id number] DOB:11-17-1965, 57 y.o., female Today's Date: 02/25/2023   PCP: [facility] REFERRING PROVIDER: Hyler, Janine Limbo, NP  END OF SESSION:  PT End of Session - 02/25/23 1624     Visit Number 9    Number of Visits 12    Date for PT Re-Evaluation 02/27/23    Authorization Type medicaid    Authorization Time Period 12 visits approved from 7/8-->03/15/23    Authorization - Visit Number 5    Authorization - Number of Visits 12    Progress Note Due on Visit 17    PT Start Time 1502    PT Stop Time 1525    PT Time Calculation (min) 23 min    Activity Tolerance Patient tolerated treatment well    Behavior During Therapy WFL for tasks assessed/performed             Past Medical History:  Diagnosis Date   Acid reflux    takes Tums   Anemia    Arthritis    Bipolar 1 disorder (HCC)    Blindness of left eye    left eye removed   Carpal tunnel syndrome, bilateral    Cervical radiculopathy    CHF (congestive heart failure) (HCC)    Chronic kidney disease    Stage 5- 01/25/17   Constipation    COPD (chronic obstructive pulmonary disease) (HCC) 2014   bronchitis   Degenerative disc disease, thoracic    Depression    Diabetes mellitus    Type II   Diabetic retinopathy (HCC)    Dyspnea    when walking   End stage kidney disease (HCC)    M, W, F [facility]   Gout    Hypertension    Non-alcoholic cirrhosis (HCC)    Noncompliance with medication regimen    Noncompliance with medication regimen    Obesity (BMI 30-39.9)    OSA (obstructive sleep apnea)    cpap   Panic attack    RLS (restless legs syndrome)    Past Surgical History:  Procedure Laterality Date   AORTIC ARCH ANGIOGRAPHY N/A 09/11/2020   Procedure: AORTIC ARCH ANGIOGRAPHY;  Surgeon: Nada Libman, MD;  Location: [facility];  Service: Cardiovascular;  Laterality: N/A;   AV FISTULA PLACEMENT Right 01/27/2017    Procedure: ARTERIOVENOUS (AV) FISTULA CREATION-RIGHT ARM;  Surgeon: Sherren Kerns, MD;  Location: [facility] OR;  Service: Vascular;  Laterality: Right;   AV FISTULA PLACEMENT Left 08/03/2018   Procedure: LEFT ARTERIOVENOUS (AV) FISTULA CREATION;  Surgeon: Sherren Kerns, MD;  Location: [facility] OR;  Service: Vascular;  Laterality: Left;   BASCILIC VEIN TRANSPOSITION Left 04/26/2019   Procedure: SECOND STAGE BASILIC VEIN TRANSPOSITION LEFT ARM;  Surgeon: Sherren Kerns, MD;  Location: [facility] OR;  Service: Vascular;  Laterality: Left;   BIOPSY N/A 05/17/2013   Procedure: BIOPSY;  Surgeon: West Bali, MD;  Location: [facility];  Service: Endoscopy;  Laterality: N/A;   CARPAL TUNNEL RELEASE Left 11/25/2018   Procedure: LEFT CARPAL TUNNEL RELEASE;  Surgeon: Dairl Ponder, MD;  Location: [facility] OR;  Service: Orthopedics;  Laterality: Left;   CATARACT EXTRACTION Right    CHOLECYSTECTOMY N/A 04/27/2014   Procedure: LAPAROSCOPIC CHOLECYSTECTOMY;  Surgeon: Marlane Hatcher, MD;  Location: [facility];  Service: General;  Laterality: N/A;   COLONOSCOPY  06/06/2010   [id number] bleeding secondary to internal hemorrhoids but incomplete evaluation secondary to poor right colon  prep/small rectal and sigmoid colon polyps (hyperplastic). PROPOFOL   COLONOSCOPY  May 2013   Dr. Gilliam/[facility]: 5 mm a descending colon polyp, hyperplastic. Adequate bowel prep.   ENDOMETRIAL ABLATION     ENUCLEATION Left 02/04/2018   ENUCLEATION WITH PLACEMENT OF IMPLANT LEFT EYE   ENUCLEATION Left 02/04/2018   Procedure: ENUCLEATION WITH PLACEMENT OF IMPLANT LEFT EYE;  Surgeon: Floydene Flock, MD;  Location: [facility] OR;  Service: Ophthalmology;  Laterality: Left;   ESOPHAGOGASTRODUODENOSCOPY  06/06/2010   [id number] erythema and edema of body of stomach, with sessile polypoid lesions. bx benign. no h.pylori   ESOPHAGOGASTRODUODENOSCOPY (EGD) WITH PROPOFOL N/A 05/17/2013   Dr. Darrick Penna: normal esophagus, moderate nodular gastritis, negative path,  empiric Savary dilation   EYE SURGERY  07/2017   sx for glaucoma   FLEXIBLE SIGMOIDOSCOPY N/A 05/17/2013   Dr. Darrick Penna: moderate sized internal hemorrhoids   HEMORRHOID BANDING N/A 05/17/2013   Procedure: HEMORRHOID BANDING;  Surgeon: West Bali, MD;  Location: [facility];  Service: Endoscopy;  Laterality: N/A;  2 bands placed   INCISION AND DRAINAGE ABSCESS Left 10/11/2013   Procedure: INCISION AND DRAINAGE AND DEBRIDEMENT LEFT BREAST  ABSCESS;  Surgeon: Marlane Hatcher, MD;  Location: [facility];  Service: General;  Laterality: Left;   INSERTION OF DIALYSIS CATHETER N/A 06/08/2018   Procedure: INSERTION OF Right internal jugular TUNNELED  DIALYSIS CATHETER;  Surgeon: Chuck Hint, MD;  Location: [facility] OR;  Service: Vascular;  Laterality: N/A;   IRRIGATION AND DEBRIDEMENT ABSCESS Right 06/01/2013   Procedure: INCISION AND DRAINAGE AND DEBRIDEMENT ABSCESS RIGHT BREAST;  Surgeon: Marlane Hatcher, MD;  Location: [facility];  Service: General;  Laterality: Right;   LEFT EYE REMOVED Left 01/2018   [facility] on [address].   LIGATION OF ARTERIOVENOUS  FISTULA Right 06/08/2018   Procedure: LIGATION OF ARTERIOVENOUS  FISTULA RIGHT ARM;  Surgeon: Chuck Hint, MD;  Location: [facility] OR;  Service: Vascular;  Laterality: Right;   ORIF ANKLE FRACTURE Right 12/17/2018   Procedure: OPEN REDUCTION INTERNAL FIXATION (ORIF) ANKLE FRACTURE;  Surgeon: Felecia Shelling, DPM;  Location: [facility] OR;  Service: Podiatry;  Laterality: Right;   REVISON OF ARTERIOVENOUS FISTULA Left 09/26/2020   Procedure: LEFT ARTERIOVENOUS FISTULA  DISTAL REVASCULARIZATION AND LIGATION ( DRIL ) USING GREATER SAPHENOUS VEIN;  Surgeon: Maeola Harman, MD;  Location: [facility] OR;  Service: Vascular;  Laterality: Left;   SAVORY DILATION N/A 05/17/2013   Procedure: SAVORY DILATION;  Surgeon: West Bali, MD;  Location: [facility];  Service: Endoscopy;  Laterality: N/A;  14/15/16   SKIN FULL THICKNESS GRAFT Left 05/05/2018    Procedure: ABDOMINAL DERMIS FAT SKIN GRAFT FULL THICKNESS LEFT EYE;  Surgeon: Floydene Flock, MD;  Location: [facility] OR;  Service: Ophthalmology;  Laterality: Left;   TUBAL LIGATION     VEIN HARVEST Left 09/26/2020   Procedure: LEFT LEG GREATER SAPHENOUS VEIN HARVEST;  Surgeon: Maeola Harman, MD;  Location: [facility] OR;  Service: Vascular;  Laterality: Left;   Patient Active Problem List   Diagnosis Date Noted   Opioid dependence (HCC) 10/18/2021   Cellulitis and abscess of hand 10/18/2021   Left hand pain 10/17/2021   Cellulitis of left hand 10/17/2021   ESRD (end stage renal disease) (HCC) 09/26/2020   Hypoalbuminemia 07/06/2020   Acute respiratory failure with hypoxia (HCC) 07/06/2020   CAP (community acquired pneumonia) 07/05/2020   Trimalleolar fracture of ankle, closed, right, initial encounter 12/17/2018   Pain and swelling of ankle,  right 12/16/2018   Metabolic encephalopathy 09/26/2018   SIRS (systemic inflammatory response syndrome) (HCC) 06/23/2018   UTI (urinary tract infection) 02/26/2018   Volume overload 02/25/2018   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   Dialysis patient (HCC) 12/13/2017   ESRD (end stage renal disease) on dialysis (HCC)    Chronic diarrhea 10/08/2017   Anemia in chronic kidney disease 05/24/2017   Chronic obstructive pulmonary disease (HCC) 05/24/2017   Heart failure (HCC) 05/24/2017   Osteopathy in diseases classified elsewhere, unspecified site 05/24/2017   Renal failure (ARF), acute on chronic (HCC) 04/22/2017   Anasarca associated with disorder of kidney 04/20/2017   Protein-calorie malnutrition, severe 11/29/2016   HTN (hypertension), malignant 11/20/2016   Bipolar 1 disorder (HCC) 11/20/2016   Noncompliance with medication regimen 11/20/2016   Panic attack 11/20/2016   Controlled type 2 diabetes mellitus without complication, without long-term current use of insulin (HCC) 10/27/2016   Sensory disturbance 10/27/2016    Left hemiparesis (HCC) 10/27/2016   Cholelithiasis 04/26/2014   Hyponatremia 04/26/2014   Gallstones 04/06/2014   Diabetic ulcer of left great toe (HCC) 10/09/2013   Type 2 diabetes mellitus treated with insulin (HCC) 05/30/2013   Hyperglycemia due to diabetes mellitus (HCC) 05/30/2013   Internal hemorrhoids with other complication 04/28/2013   Umbilical hernia without mention of obstruction or gangrene 04/15/2013   Anemia 04/15/2013   Obesity, Class III, BMI 40-49.9 (morbid obesity) (HCC) 10/08/2012   DM 05/13/2010   Hyperlipidemia 05/13/2010   Morbid obesity (HCC) 05/13/2010   Depression with anxiety 05/13/2010   RESTLESS LEG SYNDROME 05/13/2010   Essential hypertension 05/13/2010   GERD 05/13/2010   RECTAL BLEEDING 05/13/2010   OSA on CPAP 05/13/2010   Dysphagia, oropharyngeal phase 05/13/2010    ONSET DATE: 11/25/2022  REFERRING DIAG: abdominal wound   THERAPY DIAG:  Chronic abdominal wound infection, sequela  Lower abdominal pain  Rationale for Evaluation and Treatment: Rehabilitation     Wound Therapy - 02/25/23 0001     Subjective Pt stated she changed her dressings earlier today, has been massaging regularly.    Patient and Family Stated Goals wound to be healed    Date of Onset 11/25/22    Pain Scale 0-10    Pain Score 0-No pain    Pain Location Abdomen    Evaluation and Treatment Procedures Explained to Patient/Family Yes    Evaluation and Treatment Procedures agreed to    Wound Properties Date First Assessed: 02/06/23 Time First Assessed: 1100 Wound Type: Incision - Open Location: Abdomen Location Orientation: Left;Lower;Medial   Wound Image Images linked: 1    Dressing Type Gauze (Comment);Honey;Tape dressing   Inferior: packed with medihoney, 2x2, ABD pad with medipore tape   Dressing Changed Changed    Dressing Status Old drainage    Dressing Change Frequency PRN    Site / Wound Assessment Yellow;Pink    % Wound base Red or Granulating 25%    % Wound  base Yellow/Fibrinous Exudate 75%    Peri-wound Assessment Edema;Induration    Drainage Amount Minimal    Drainage Description Serosanguineous    Treatment Cleansed    Wound Properties Date First Assessed: 01/16/23 Time First Assessed: 1530 Location: Abdomen Location Orientation: Lower;Medial;Right Wound Description (Comments): Rt lower abdominal Present on Admission: Yes   Dressing Type Gauze (Comment)   superior: xeroform, vaseline, 2x2, abd pad, medipore tape   Dressing Changed Changed    Dressing Status Old drainage    Dressing Change Frequency PRN  Site / Wound Assessment Pink;Yellow    % Wound base Red or Granulating 80%    % Wound base Yellow/Fibrinous Exudate 20%    Drainage Amount Minimal    Drainage Description Serosanguineous    Treatment Cleansed;Debridement (Selective)    Selective Debridement (non-excisional) - Location wound bed    Selective Debridement (non-excisional) - Tools Used Forceps;Scalpel    Selective Debridement (non-excisional) - Tissue Removed devitalized tissue    Wound Therapy - Clinical Statement see below    Wound Therapy - Functional Problem List difficulty with dressing , cleansing    Factors Delaying/Impairing Wound Healing Altered sensation;Diabetes Mellitus;Infection - systemic/local;Polypharmacy;Vascular compromise    Hydrotherapy Plan Debridement;Dressing change;Patient/family education    Wound Therapy - Frequency 2X / week    Wound Therapy - Current Recommendations PT    Wound Plan see TIW x 6 weeks to ensure proper healing is occuring    Dressing  superior: xeroform, vaseline, 2x2, abd pad, medipore tape    Dressing Inferior: packed with medihoney, 2x2, ABD pad with medipore tape                 PATIENT EDUCATION: Education details: self massage to indurated area surrounding the wound, the importance of keeping her sugar level near 100, eat extra protein, keep dressing dry.  Person educated: Patient and Caregiver   Education  method: Explanation Education comprehension: verbalized understanding   HOME EXERCISE PROGRAM: Self manual   GOALS: Goals reviewed with patient? No  SHORT TERM GOALS: Target date: 01/30/2023  Wound to be 100% granulated Baseline: Goal status: INITIAL  2.  Pain to be no greater than 5/10 Baseline:  Goal status: INITIAL   LONG TERM GOALS: Target date: 02/27/2023  Wound size to be no more than 2x2 cm  Baseline:  Goal status: INITIAL  2.  Wound to have  no undermining Baseline:  Goal status: INITIAL  3.  Depth to be no greater than 1 cm  Baseline:  Goal status: INITIAL  4.  Pain to be no greater than a 2/10 Baseline:  Goal status: INITIAL  5.  Aide to be confident in continued care of wound  Baseline:  Goal status: INITIAL    ASSESSMENT:  CLINICAL IMPRESSION: Medihoney adherent to wound bed, selective debridement for removal of old drainage as well as slough from wound bed.  Increased bleeding from superior wound bed noted this session.  Noted tape used irritated surrounding skin, strongly encouraged to purchase medipore tape for skin integrity.  Pt continues to have induration surrounding wound though has reduced, pt stated she has been massaging regularly.   Patient is a 57 y.o. female who was seen today for physical therapy evaluation and treatment for  Diagnosis Description  non healing wound abdominal wound  PMH affecting wound healing includes Mhistory of insulin-dependent type 2 diabetes, bipolar 1 disorder, COPD, ESRD on hemodialysis TTS, nonalcoholic cirrhosis on dialysis, sleep apnea on CPAP, hypertension, restless legs .  Pt has had a progressive abdominal wound and is now being referred to skilled PT for treatment.  Ms. Widrig will benefit from skilled PT to ensure proper wound healing is occurring as the pt is legally blind.    OBJECTIVE IMPAIRMENTS: obesity, pain, and decreased skin integrity .   ACTIVITY LIMITATIONS: dressing and  hygiene/grooming  REHAB POTENTIAL: Good  CLINICAL DECISION MAKING: Stable/uncomplicated  EVALUATION COMPLEXITY: Moderate  PLAN: PT FREQUENCY: 2x/week  PT DURATION: 6 weeks  PLANNED INTERVENTIONS: Patient/Family education, Self Care, Manual therapy, and debridement and dressing  change  PLAN FOR NEXT SESSION:  Continue with debridement and dressing change.  PT IS LEGALLY BLIND AND NEEDS NAVIGATIONAL ASSISTANCE TO GET TO WOUND ROOM*  Becky Sax, LPTA/CLT; CBIS [phone]  Juel Burrow, PTA 02/25/2023, 4:25 PM   02/25/2023, 4:25 PM

## 2023-02-27 ENCOUNTER — Ambulatory Visit (HOSPITAL_COMMUNITY): Payer: Medicaid Other | Admitting: Physical Therapy

## 2023-03-04 ENCOUNTER — Ambulatory Visit (HOSPITAL_COMMUNITY): Payer: Medicaid Other | Attending: Nurse Practitioner | Admitting: Physical Therapy

## 2023-03-04 DIAGNOSIS — L089 Local infection of the skin and subcutaneous tissue, unspecified: Secondary | ICD-10-CM | POA: Insufficient documentation

## 2023-03-04 DIAGNOSIS — S31109S Unspecified open wound of abdominal wall, unspecified quadrant without penetration into peritoneal cavity, sequela: Secondary | ICD-10-CM | POA: Insufficient documentation

## 2023-03-04 DIAGNOSIS — R1030 Lower abdominal pain, unspecified: Secondary | ICD-10-CM | POA: Diagnosis present

## 2023-03-04 NOTE — Therapy (Signed)
OUTPATIENT PHYSICAL THERAPY Wound Treatment/Progress   Patient Name: Martha [name] MRN: [id number] DOB:30-Oct-1965, 57 y.o., female Today's Date: 03/04/2023   PCP: [facility] REFERRING PROVIDER: Damaris Schooner Janine Limbo, NP  Progress Note Reporting Period 6/21 to 03/04/23  See note below for Objective Data and Assessment of Progress/Goals.     END OF SESSION:  PT End of Session - 03/04/23 1154     Visit Number 10    Number of Visits 20    Date for PT Re-Evaluation 04/08/23    Authorization Type medicaid    Authorization Time Period 12 visits approved from 7/8-->03/15/23; requesting 10 more visits following 8/19    Authorization - Visit Number 9    Authorization - Number of Visits 12    Progress Note Due on Visit 20    PT Start Time 1110    PT Stop Time 1150    PT Time Calculation (min) 40 min    Activity Tolerance Patient tolerated treatment well    Behavior During Therapy WFL for tasks assessed/performed             Past Medical History:  Diagnosis Date   Acid reflux    takes Tums   Anemia    Arthritis    Bipolar 1 disorder (HCC)    Blindness of left eye    left eye removed   Carpal tunnel syndrome, bilateral    Cervical radiculopathy    CHF (congestive heart failure) (HCC)    Chronic kidney disease    Stage 5- 01/25/17   Constipation    COPD (chronic obstructive pulmonary disease) (HCC) 2014   bronchitis   Degenerative disc disease, thoracic    Depression    Diabetes mellitus    Type II   Diabetic retinopathy (HCC)    Dyspnea    when walking   End stage kidney disease (HCC)    M, W, F [facility]   Gout    Hypertension    Non-alcoholic cirrhosis (HCC)    Noncompliance with medication regimen    Noncompliance with medication regimen    Obesity (BMI 30-39.9)    OSA (obstructive sleep apnea)    cpap   Panic attack    RLS (restless legs syndrome)    Past Surgical History:  Procedure Laterality Date   AORTIC ARCH ANGIOGRAPHY N/A 09/11/2020    Procedure: AORTIC ARCH ANGIOGRAPHY;  Surgeon: Nada Libman, MD;  Location: [facility];  Service: Cardiovascular;  Laterality: N/A;   AV FISTULA PLACEMENT Right 01/27/2017   Procedure: ARTERIOVENOUS (AV) FISTULA CREATION-RIGHT ARM;  Surgeon: Sherren Kerns, MD;  Location: [facility] OR;  Service: Vascular;  Laterality: Right;   AV FISTULA PLACEMENT Left 08/03/2018   Procedure: LEFT ARTERIOVENOUS (AV) FISTULA CREATION;  Surgeon: Sherren Kerns, MD;  Location: [facility] OR;  Service: Vascular;  Laterality: Left;   BASCILIC VEIN TRANSPOSITION Left 04/26/2019   Procedure: SECOND STAGE BASILIC VEIN TRANSPOSITION LEFT ARM;  Surgeon: Sherren Kerns, MD;  Location: [facility] OR;  Service: Vascular;  Laterality: Left;   BIOPSY N/A 05/17/2013   Procedure: BIOPSY;  Surgeon: West Bali, MD;  Location: [facility] ORS;  Service: Endoscopy;  Laterality: N/A;   CARPAL TUNNEL RELEASE Left 11/25/2018   Procedure: LEFT CARPAL TUNNEL RELEASE;  Surgeon: Dairl Ponder, MD;  Location: [facility] OR;  Service: Orthopedics;  Laterality: Left;   CATARACT EXTRACTION Right    CHOLECYSTECTOMY N/A 04/27/2014   Procedure: LAPAROSCOPIC CHOLECYSTECTOMY;  Surgeon: Marlane Hatcher, MD;  Location: [facility]  ORS;  Service: General;  Laterality: N/A;   COLONOSCOPY  06/06/2010   ZOX:WRUEAV bleeding secondary to internal hemorrhoids but incomplete evaluation secondary to poor right colon prep/small rectal and sigmoid colon polyps (hyperplastic). PROPOFOL   COLONOSCOPY  May 2013   Dr. Gilliam/[facility]: 5 mm a descending colon polyp, hyperplastic. Adequate bowel prep.   ENDOMETRIAL ABLATION     ENUCLEATION Left 02/04/2018   ENUCLEATION WITH PLACEMENT OF IMPLANT LEFT EYE   ENUCLEATION Left 02/04/2018   Procedure: ENUCLEATION WITH PLACEMENT OF IMPLANT LEFT EYE;  Surgeon: Floydene Flock, MD;  Location: [facility] OR;  Service: Ophthalmology;  Laterality: Left;   ESOPHAGOGASTRODUODENOSCOPY  06/06/2010   WUJ:WJXBJYN erythema and edema of body of stomach, with sessile  polypoid lesions. bx benign. no h.pylori   ESOPHAGOGASTRODUODENOSCOPY (EGD) WITH PROPOFOL N/A 05/17/2013   Dr. Darrick Penna: normal esophagus, moderate nodular gastritis, negative path, empiric Savary dilation   EYE SURGERY  07/2017   sx for glaucoma   FLEXIBLE SIGMOIDOSCOPY N/A 05/17/2013   Dr. Darrick Penna: moderate sized internal hemorrhoids   HEMORRHOID BANDING N/A 05/17/2013   Procedure: HEMORRHOID BANDING;  Surgeon: West Bali, MD;  Location: [facility];  Service: Endoscopy;  Laterality: N/A;  2 bands placed   INCISION AND DRAINAGE ABSCESS Left 10/11/2013   Procedure: INCISION AND DRAINAGE AND DEBRIDEMENT LEFT BREAST  ABSCESS;  Surgeon: Marlane Hatcher, MD;  Location: [facility];  Service: General;  Laterality: Left;   INSERTION OF DIALYSIS CATHETER N/A 06/08/2018   Procedure: INSERTION OF Right internal jugular TUNNELED  DIALYSIS CATHETER;  Surgeon: Chuck Hint, MD;  Location: [facility] OR;  Service: Vascular;  Laterality: N/A;   IRRIGATION AND DEBRIDEMENT ABSCESS Right 06/01/2013   Procedure: INCISION AND DRAINAGE AND DEBRIDEMENT ABSCESS RIGHT BREAST;  Surgeon: Marlane Hatcher, MD;  Location: [facility];  Service: General;  Laterality: Right;   LEFT EYE REMOVED Left 01/2018   [facility] [address].   LIGATION OF ARTERIOVENOUS  FISTULA Right 06/08/2018   Procedure: LIGATION OF ARTERIOVENOUS  FISTULA RIGHT ARM;  Surgeon: Chuck Hint, MD;  Location: [facility] OR;  Service: Vascular;  Laterality: Right;   ORIF ANKLE FRACTURE Right 12/17/2018   Procedure: OPEN REDUCTION INTERNAL FIXATION (ORIF) ANKLE FRACTURE;  Surgeon: Felecia Shelling, DPM;  Location: [facility] OR;  Service: Podiatry;  Laterality: Right;   REVISON OF ARTERIOVENOUS FISTULA Left 09/26/2020   Procedure: LEFT ARTERIOVENOUS FISTULA  DISTAL REVASCULARIZATION AND LIGATION ( DRIL ) USING GREATER SAPHENOUS VEIN;  Surgeon: Maeola Harman, MD;  Location: [facility] OR;  Service: Vascular;  Laterality: Left;   SAVORY DILATION N/A  05/17/2013   Procedure: SAVORY DILATION;  Surgeon: West Bali, MD;  Location: [facility];  Service: Endoscopy;  Laterality: N/A;  14/15/16   SKIN FULL THICKNESS GRAFT Left 05/05/2018   Procedure: ABDOMINAL DERMIS FAT SKIN GRAFT FULL THICKNESS LEFT EYE;  Surgeon: Floydene Flock, MD;  Location: [facility] OR;  Service: Ophthalmology;  Laterality: Left;   TUBAL LIGATION     VEIN HARVEST Left 09/26/2020   Procedure: LEFT LEG GREATER SAPHENOUS VEIN HARVEST;  Surgeon: Maeola Harman, MD;  Location: [facility] OR;  Service: Vascular;  Laterality: Left;   Patient Active Problem List   Diagnosis Date Noted   Opioid dependence (HCC) 10/18/2021   Cellulitis and abscess of hand 10/18/2021   Left hand pain 10/17/2021   Cellulitis of left hand 10/17/2021   ESRD (end stage renal disease) (HCC) 09/26/2020   Hypoalbuminemia 07/06/2020   Acute respiratory failure with  hypoxia (HCC) 07/06/2020   CAP (community acquired pneumonia) 07/05/2020   Trimalleolar fracture of ankle, closed, right, initial encounter 12/17/2018   Pain and swelling of ankle, right 12/16/2018   Metabolic encephalopathy 09/26/2018   SIRS (systemic inflammatory response syndrome) (HCC) 06/23/2018   UTI (urinary tract infection) 02/26/2018   Volume overload 02/25/2018   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   Dialysis patient (HCC) 12/13/2017   ESRD (end stage renal disease) on dialysis (HCC)    Chronic diarrhea 10/08/2017   Anemia in chronic kidney disease 05/24/2017   Chronic obstructive pulmonary disease (HCC) 05/24/2017   Heart failure (HCC) 05/24/2017   Osteopathy in diseases classified elsewhere, unspecified site 05/24/2017   Renal failure (ARF), acute on chronic (HCC) 04/22/2017   Anasarca associated with disorder of kidney 04/20/2017   Protein-calorie malnutrition, severe 11/29/2016   HTN (hypertension), malignant 11/20/2016   Bipolar 1 disorder (HCC) 11/20/2016   Noncompliance with medication regimen 11/20/2016    Panic attack 11/20/2016   Controlled type 2 diabetes mellitus without complication, without long-term current use of insulin (HCC) 10/27/2016   Sensory disturbance 10/27/2016   Left hemiparesis (HCC) 10/27/2016   Cholelithiasis 04/26/2014   Hyponatremia 04/26/2014   Gallstones 04/06/2014   Diabetic ulcer of left great toe (HCC) 10/09/2013   Type 2 diabetes mellitus treated with insulin (HCC) 05/30/2013   Hyperglycemia due to diabetes mellitus (HCC) 05/30/2013   Internal hemorrhoids with other complication 04/28/2013   Umbilical hernia without mention of obstruction or gangrene 04/15/2013   Anemia 04/15/2013   Obesity, Class III, BMI 40-49.9 (morbid obesity) (HCC) 10/08/2012   DM 05/13/2010   Hyperlipidemia 05/13/2010   Morbid obesity (HCC) 05/13/2010   Depression with anxiety 05/13/2010   RESTLESS LEG SYNDROME 05/13/2010   Essential hypertension 05/13/2010   GERD 05/13/2010   RECTAL BLEEDING 05/13/2010   OSA on CPAP 05/13/2010   Dysphagia, oropharyngeal phase 05/13/2010    ONSET DATE: 11/25/2022  REFERRING DIAG: abdominal wound   THERAPY DIAG:  Chronic abdominal wound infection, sequela  Lower abdominal pain  Rationale for Evaluation and Treatment: Rehabilitation     Wound Therapy - 03/04/23 0001     Subjective Pt states she has no pain    Patient and Family Stated Goals wound to be healed    Date of Onset 11/25/22    Pain Scale 0-10    Pain Score 0-No pain    Evaluation and Treatment Procedures Explained to Patient/Family Yes    Evaluation and Treatment Procedures agreed to    Wound Properties Date First Assessed: 02/06/23 Time First Assessed: 1100 Wound Type: Incision - Open Location: Abdomen Location Orientation: Left;Lower;Medial Wound Description (Comments): this is the most inferior wound Present on Admission: No   Dressing Type Gauze (Comment);Honey;Tape dressing   Inferior: packed with medihoney, 2x2, ABD pad with medipore tape   Dressing Changed Changed     Dressing Status Old drainage    Dressing Change Frequency PRN    Site / Wound Assessment Yellow;Pink    % Wound base Red or Granulating 30%    % Wound base Yellow/Fibrinous Exudate 70%    Peri-wound Assessment Edema;Induration    Wound Length (cm) 0.3 cm  was not present initally   Wound Width (cm) 1.3 cm    Wound Depth (cm) 1.8 cm    Wound Volume (cm^3) 0.7 cm^3    Wound Surface Area (cm^2) 0.39 cm^2    Drainage Amount Minimal    Drainage Description Serous    Treatment Cleansed;Debridement (  Selective)    Wound Properties Date First Assessed: 01/16/23 Time First Assessed: 1530 Location: Abdomen Location Orientation: Lower;Medial;Right Wound Description (Comments): Rt lower abdominal Present on Admission: Yes   Dressing Type Gauze (Comment)   superior: xeroform, vaseline, 2x2, abd pad, medipore tape   Dressing Changed Changed    Dressing Status Old drainage    Dressing Change Frequency PRN    Site / Wound Assessment Pink;Yellow    % Wound base Red or Granulating 95%   was 75%   % Wound base Yellow/Fibrinous Exudate 5% was 25%   Wound Length (cm) 2 cm was 2.5 cm    Wound Width (cm) 1 cm  was 3.8 cm    Wound Depth (cm) 0.6 cm was 1.9 cm    Wound Volume (cm^3) 1.2 cm^3    Wound Surface Area (cm^2) 2 cm^2    Drainage Amount Minimal    Drainage Description Serous    Treatment Cleansed;Debridement (Selective)    Selective Debridement (non-excisional) - Location wound bed    Selective Debridement (non-excisional) - Tools Used Forceps;Scalpel    Selective Debridement (non-excisional) - Tissue Removed devitalized tissue    Wound Therapy - Clinical Statement see below    Wound Therapy - Functional Problem List difficulty with dressing , cleansing    Factors Delaying/Impairing Wound Healing Altered sensation;Diabetes Mellitus;Infection - systemic/local;Polypharmacy;Vascular compromise    Hydrotherapy Plan Debridement;Dressing change;Patient/family education    Wound Therapy - Frequency 2X /  week    Wound Therapy - Current Recommendations PT    Wound Plan see TIW x 6 weeks to ensure proper healing is occuring    Dressing  superior: xeroform, vaseline, 2x2, abd pad, medipore tape    Dressing Inferior: packed with wet to moist saline 2x2 re tape                 PATIENT EDUCATION: Education details: self massage to indurated area surrounding the wound, the importance of keeping her sugar level near 100, eat extra protein, keep dressing dry.  Person educated: Patient and Caregiver   Education method: Explanation Education comprehension: verbalized understanding   HOME EXERCISE PROGRAM: Self manual   GOALS: Goals reviewed with patient? No  SHORT TERM GOALS: Target date: 01/30/2023  Wound to be 100% granulated Baseline: Goal status: MET for original wound   2.  Pain to be no greater than 5/10 Baseline:  Goal status: MET   LONG TERM GOALS: Target date: 02/27/2023  Wound size to be no more than 2x2 cm  Baseline:  Goal status: MET  2.  Wound to have  no undermining Baseline:  Goal status: MET  3.  Depth to be no greater than 1 cm  Baseline:  Goal status: IN PROGRESS  4.  Pain to be no greater than a 2/10 Baseline:  Goal status: MET  5.  Aide to be confident in continued care of wound  Baseline:  Goal status: NOT MET    ASSESSMENT:  CLINICAL IMPRESSION: Pt reassessed.  PT has had a second wound develop inferior to the original wound .  Both wounds continue to approximate but also have slough that needs to be debrided.  Pt most inferior wound continues to be deep and needs packing.  Pt is blind and is unable to complete self care in the case of these two wounds and therefore will continue to need skilled PT.      Diagnosis Description  non healing wound abdominal wound  PMH affecting wound healing includes Mhistory of  insulin-dependent type 2 diabetes, bipolar 1 disorder, COPD, ESRD on hemodialysis TTS, nonalcoholic cirrhosis on dialysis, sleep  apnea on CPAP, hypertension, restless legs .  Pt has had a progressive abdominal wound and is now being referred to skilled PT for treatment.  Ms. Randleman will benefit from skilled PT to ensure proper wound healing is occurring as the pt is legally blind.    OBJECTIVE IMPAIRMENTS: obesity, pain, and decreased skin integrity .   ACTIVITY LIMITATIONS: dressing and hygiene/grooming  REHAB POTENTIAL: Good  CLINICAL DECISION MAKING: Stable/uncomplicated  EVALUATION COMPLEXITY: Moderate  PLAN: PT FREQUENCY: 2x/week  PT DURATION: 6 weeks  PLANNED INTERVENTIONS: Patient/Family education, Self Care, Manual therapy, and debridement and dressing change  PLAN FOR NEXT SESSION:  Continue with debridement and dressing change.  PT IS LEGALLY BLIND AND NEEDS NAVIGATIONAL ASSISTANCE TO GET TO WOUND ROOMVirgina Organ, PT CLT [phone]  03/04/2023, 12:03 PM   03/04/2023, 12:03 PM

## 2023-03-11 ENCOUNTER — Encounter: Payer: Self-pay | Admitting: Infectious Diseases

## 2023-03-11 ENCOUNTER — Other Ambulatory Visit: Payer: Self-pay

## 2023-03-11 ENCOUNTER — Ambulatory Visit (HOSPITAL_COMMUNITY): Payer: Medicaid Other

## 2023-03-11 ENCOUNTER — Ambulatory Visit (INDEPENDENT_AMBULATORY_CARE_PROVIDER_SITE_OTHER): Payer: Medicaid Other | Admitting: Infectious Diseases

## 2023-03-11 VITALS — BP 177/92 | HR 84 | Resp 16 | Ht 67.0 in | Wt 262.7 lb

## 2023-03-11 DIAGNOSIS — Z992 Dependence on renal dialysis: Secondary | ICD-10-CM | POA: Diagnosis not present

## 2023-03-11 DIAGNOSIS — Z7189 Other specified counseling: Secondary | ICD-10-CM | POA: Insufficient documentation

## 2023-03-11 DIAGNOSIS — S31109A Unspecified open wound of abdominal wall, unspecified quadrant without penetration into peritoneal cavity, initial encounter: Secondary | ICD-10-CM | POA: Diagnosis present

## 2023-03-11 DIAGNOSIS — N186 End stage renal disease: Secondary | ICD-10-CM

## 2023-03-11 DIAGNOSIS — Z6841 Body Mass Index (BMI) 40.0 and over, adult: Secondary | ICD-10-CM | POA: Diagnosis not present

## 2023-03-11 DIAGNOSIS — Z79899 Other long term (current) drug therapy: Secondary | ICD-10-CM | POA: Insufficient documentation

## 2023-03-11 NOTE — Progress Notes (Signed)
Patient Active Problem List   Diagnosis Date Noted   Opioid dependence (HCC) 10/18/2021   Cellulitis and abscess of hand 10/18/2021   Left hand pain 10/17/2021   Cellulitis of left hand 10/17/2021   ESRD (end stage renal disease) (HCC) 09/26/2020   Hypoalbuminemia 07/06/2020   Acute respiratory failure with hypoxia (HCC) 07/06/2020   CAP (community acquired pneumonia) 07/05/2020   Trimalleolar fracture of ankle, closed, right, initial encounter 12/17/2018   Pain and swelling of ankle, right 12/16/2018   Metabolic encephalopathy 09/26/2018   SIRS (systemic inflammatory response syndrome) (HCC) 06/23/2018   UTI (urinary tract infection) 02/26/2018   Volume overload 02/25/2018   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   Dialysis patient (HCC) 12/13/2017   ESRD (end stage renal disease) on dialysis (HCC)    Chronic diarrhea 10/08/2017   Anemia in chronic kidney disease 05/24/2017   Chronic obstructive pulmonary disease (HCC) 05/24/2017   Heart failure (HCC) 05/24/2017   Osteopathy in diseases classified elsewhere, unspecified site 05/24/2017   Renal failure (ARF), acute on chronic (HCC) 04/22/2017   Anasarca associated with disorder of kidney 04/20/2017   Protein-calorie malnutrition, severe 11/29/2016   HTN (hypertension), malignant 11/20/2016   Bipolar 1 disorder (HCC) 11/20/2016   Noncompliance with medication regimen 11/20/2016   Panic attack 11/20/2016   Controlled type 2 diabetes mellitus without complication, without long-term current use of insulin (HCC) 10/27/2016   Sensory disturbance 10/27/2016   Left hemiparesis (HCC) 10/27/2016   Cholelithiasis 04/26/2014   Hyponatremia 04/26/2014   Gallstones 04/06/2014   Diabetic ulcer of left great toe (HCC) 10/09/2013   Type 2 diabetes mellitus treated with insulin (HCC) 05/30/2013   Hyperglycemia due to diabetes mellitus (HCC) 05/30/2013   Internal hemorrhoids with other complication 04/28/2013   Umbilical  hernia without mention of obstruction or gangrene 04/15/2013   Anemia 04/15/2013   Obesity, Class III, BMI 40-49.9 (morbid obesity) (HCC) 10/08/2012   DM 05/13/2010   Hyperlipidemia 05/13/2010   Morbid obesity (HCC) 05/13/2010   Depression with anxiety 05/13/2010   RESTLESS LEG SYNDROME 05/13/2010   Essential hypertension 05/13/2010   GERD 05/13/2010   RECTAL BLEEDING 05/13/2010   OSA on CPAP 05/13/2010   Dysphagia, oropharyngeal phase 05/13/2010    Patient's Medications  New Prescriptions   No medications on file  Previous Medications   ADVAIR DISKUS 250-50 MCG/ACT AEPB    Inhale 1 puff into the lungs 2 (two) times daily.   ALBUTEROL (PROVENTIL) (2.5 MG/3ML) 0.083% NEBULIZER SOLUTION    Take 3 mLs (2.5 mg total) by nebulization every 12 (twelve) hours as needed for wheezing or shortness of breath.   ALPRAZOLAM (XANAX) 1 MG TABLET    Take 1 tablet (1 mg total) by mouth 4 (four) times daily as needed for anxiety.   ARTIFICIAL TEAR OINTMENT (DRY EYES OP)    Place 1 drop into the right eye 2 (two) times daily.   ATORVASTATIN (LIPITOR) 20 MG TABLET    Take by mouth.   ATROPINE (ISOPTO ATROPINE) 1 % OPHTHALMIC SOLUTION    Apply to eye.   BLOOD GLUCOSE MONITORING SUPPL (FORA V30A BLOOD GLUCOSE SYSTEM) DEVI       BRIMONIDINE (ALPHAGAN) 0.2 % OPHTHALMIC SOLUTION    PLACE 1 DROP INTO THE RIGHT EYE 3 TIMES A DAY.   BUSPIRONE (BUSPAR) 7.5 MG TABLET    Take 1 tablet (7.5 mg total) by mouth 2 (two) times daily.   CALCIUM CARBONATE (TUMS EX) 750 MG CHEWABLE TABLET  Chew 1-3 tablets by mouth See admin instructions. Take 3 tablets with each meal & take 1 tablets with each snack   CANAGLIFLOZIN (INVOKANA) 300 MG TABS TABLET    Take by mouth.   CINACALCET (SENSIPAR) 60 MG TABLET    Take 60 mg by mouth daily.   COLCHICINE 0.6 MG TABLET    Take by mouth.   DEXTROMETHORPHAN (DELSYM) 30 MG/5ML LIQUID    Take 5 mLs (30 mg total) by mouth 2 (two) times daily as needed for cough.   DORZOLAMIDE-TIMOLOL  (COSOPT) 22.3-6.8 MG/ML OPHTHALMIC SOLUTION    Place 1 drop into the right eye in the morning and at bedtime.   DOXERCALCIFEROL (HECTOROL) 4 MCG/2ML INJECTION    Inject 0.5 mLs (1 mcg total) into the vein every Monday, Wednesday, and Friday with hemodialysis.   FLUOCINONIDE CREAM (LIDEX) 0.05 %    Apply 1 Application topically 2 (two) times daily.   FLUTICASONE (CUTIVATE) 0.05 % CREAM    Apply topically.   FORACARE PREMIUM V10 TEST TEST STRIP       GABAPENTIN (NEURONTIN) 100 MG CAPSULE    Take 1 capsule (100 mg total) by mouth at bedtime.   HYDROCODONE-ACETAMINOPHEN (NORCO) 10-325 MG TABLET    Take 1 tablet by mouth 4 (four) times daily.   HYDROXYZINE (ATARAX) 25 MG TABLET    Take 25 mg by mouth 2 (two) times daily.   INSULIN ASPART (NOVOLOG) 100 UNIT/ML INJECTION    Inject 4-7 Units into the skin 3 (three) times daily with meals. Sliding scale insulin   INSULIN GLARGINE (BASAGLAR KWIKPEN) 100 UNIT/ML    Inject 30 Units into the skin at bedtime.   KETOCONAZOLE (NIZORAL) 2 % CREAM    Apply topically.   LATANOPROST (XALATAN) 0.005 % OPHTHALMIC SOLUTION    Place 1 drop into the right eye 2 (two) times daily.   LEPTOSPERMUM MANUKA HONEY (MEDIHONEY) PSTE PASTE    Apply 1 Application topically daily.   LIDOCAINE-PRILOCAINE (EMLA) CREAM    Apply 1 application topically as needed (topical anesthesia for hemodialysis if Gebauers and Lidocaine injection are ineffective.).   OMEPRAZOLE (PRILOSEC) 40 MG CAPSULE    Take 40 mg by mouth daily.   ONDANSETRON (ZOFRAN) 4 MG TABLET    TAKE (1) TABLET THREE TIMES DAILY AS NEEDED FOR NAUSEA.   OXYCODONE (ROXICODONE) 5 MG IMMEDIATE RELEASE TABLET    Take 1 tablet (5 mg total) by mouth every 6 (six) hours as needed for severe pain or breakthrough pain.   PAROXETINE (PAXIL) 20 MG TABLET    Take 1 tablet (20 mg total) by mouth every evening. This is to prevent panic attacks   PROMETHAZINE (PHENERGAN) 25 MG TABLET    Take 1 tablet (25 mg total) by mouth every 6 (six) hours  as needed for nausea or vomiting.  Modified Medications   No medications on file  Discontinued Medications   No medications on file    Subjective: 57 Y O female with prior h/o Bipolar 1 d/o/Depression, ESRD on HD, CHF, COPD, DM2 w retinopathy, HTN, Gout, Obesity/OSA, Non alcoholic liver cirrhosis who is referred from dialysis center for abdominal wall wound.   Accompanied by her caregiver. Reports the lowerabdominal wound has been present for approx 3-6 months and she is following with wound care at [facility]. 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 She reports completing abtx for the abdominal wound 3 weeks ago where they had to stop abtx eventually due to intolerance  and reports being off abtx currently. It appears from outside notes she was started on Vancomycin with HD from 8/9 but seems they stopped it after few days due to itching. Reports taking both po as well as IV abtx with HD previously. Caregiver reports wound has been improving in the last few weeks with no drainage, redness, swelling. Patient denies fevers, chills, nausea, vomiting, abdominal pain and diarrhea. Denies any rashes/GU symptoms Complains of chronic SOB but denies cough or chest pain.   She has a wound cx from 6/11 growing E coli, Proteus mirabilis, E faecalis and MRSA and also another wound cx on 7/11 with GNR ( unavailable results).  Review of Systems: all systems reviewed with pertinent positives and negatives as listed above   Past Medical History:  Diagnosis Date   Acid reflux    takes Tums   Anemia    Arthritis    Bipolar 1 disorder (HCC)    Blindness of left eye    left eye removed   Carpal tunnel syndrome, bilateral    Cervical radiculopathy    CHF (congestive heart failure) (HCC)    Chronic kidney disease    Stage 5- 01/25/17   Constipation    COPD (chronic obstructive pulmonary disease) (HCC) 2014   bronchitis   Degenerative disc disease, thoracic    Depression     Diabetes mellitus    Type II   Diabetic retinopathy (HCC)    Dyspnea    when walking   End stage kidney disease (HCC)    M, W, F [facility]   Gout    Hypertension    Non-alcoholic cirrhosis (HCC)    Noncompliance with medication regimen    Noncompliance with medication regimen    Obesity (BMI 30-39.9)    OSA (obstructive sleep apnea)    cpap   Panic attack    RLS (restless legs syndrome)    Past Surgical History:  Procedure Laterality Date   AORTIC ARCH ANGIOGRAPHY N/A 09/11/2020   Procedure: AORTIC ARCH ANGIOGRAPHY;  Surgeon: Nada Libman, MD;  [facility]: [facility];  Service: Cardiovascular;  Laterality: N/A;   AV FISTULA PLACEMENT Right 01/27/2017   Procedure: ARTERIOVENOUS (AV) FISTULA CREATION-RIGHT ARM;  Surgeon: Sherren Kerns, MD;  Location: [facility] OR;  Service: Vascular;  Laterality: Right;   AV FISTULA PLACEMENT Left 08/03/2018   Procedure: LEFT ARTERIOVENOUS (AV) FISTULA CREATION;  Surgeon: Sherren Kerns, MD;  Location: [facility] OR;  Service: Vascular;  Laterality: Left;   BASCILIC VEIN TRANSPOSITION Left 04/26/2019   Procedure: SECOND STAGE BASILIC VEIN TRANSPOSITION LEFT ARM;  Surgeon: Sherren Kerns, MD;  Location: [facility] OR;  Service: Vascular;  Laterality: Left;   BIOPSY N/A 05/17/2013   Procedure: BIOPSY;  Surgeon: West Bali, MD;  Location: [facility] ORS;  Service: Endoscopy;  Laterality: N/A;   CARPAL TUNNEL RELEASE Left 11/25/2018   Procedure: LEFT CARPAL TUNNEL RELEASE;  Surgeon: Dairl Ponder, MD;  Location: [facility] OR;  Service: Orthopedics;  Laterality: Left;   CATARACT EXTRACTION Right    CHOLECYSTECTOMY N/A 04/27/2014   Procedure: LAPAROSCOPIC CHOLECYSTECTOMY;  Surgeon: Marlane Hatcher, MD;  Location: [facility];  Service: General;  Laterality: N/A;   COLONOSCOPY  06/06/2010   [id number] bleeding secondary to internal hemorrhoids but incomplete evaluation secondary to poor right colon prep/small rectal and sigmoid colon polyps (hyperplastic). PROPOFOL    COLONOSCOPY  May 2013   Dr. Gilliam/[facility]: 5 mm a descending colon polyp, hyperplastic. Adequate bowel prep.   ENDOMETRIAL ABLATION  ENUCLEATION Left 02/04/2018   ENUCLEATION WITH PLACEMENT OF IMPLANT LEFT EYE   ENUCLEATION Left 02/04/2018   Procedure: ENUCLEATION WITH PLACEMENT OF IMPLANT LEFT EYE;  Surgeon: Floydene Flock, MD;  Location: [facility] OR;  Service: Ophthalmology;  Laterality: Left;   ESOPHAGOGASTRODUODENOSCOPY  06/06/2010   [id number] erythema and edema of body of stomach, with sessile polypoid lesions. bx benign. no h.pylori   ESOPHAGOGASTRODUODENOSCOPY (EGD) WITH PROPOFOL N/A 05/17/2013   Dr. Darrick Penna: normal esophagus, moderate nodular gastritis, negative path, empiric Savary dilation   EYE SURGERY  07/2017   sx for glaucoma   FLEXIBLE SIGMOIDOSCOPY N/A 05/17/2013   Dr. Darrick Penna: moderate sized internal hemorrhoids   HEMORRHOID BANDING N/A 05/17/2013   Procedure: HEMORRHOID BANDING;  Surgeon: West Bali, MD;  Location: [facility];  Service: Endoscopy;  Laterality: N/A;  2 bands placed   INCISION AND DRAINAGE ABSCESS Left 10/11/2013   Procedure: INCISION AND DRAINAGE AND DEBRIDEMENT LEFT BREAST  ABSCESS;  Surgeon: Marlane Hatcher, MD;  Location: [facility];  Service: General;  Laterality: Left;   INSERTION OF DIALYSIS CATHETER N/A 06/08/2018   Procedure: INSERTION OF Right internal jugular TUNNELED  DIALYSIS CATHETER;  Surgeon: Chuck Hint, MD;  Location: [facility] OR;  Service: Vascular;  Laterality: N/A;   IRRIGATION AND DEBRIDEMENT ABSCESS Right 06/01/2013   Procedure: INCISION AND DRAINAGE AND DEBRIDEMENT ABSCESS RIGHT BREAST;  Surgeon: Marlane Hatcher, MD;  Location: [facility];  Service: General;  Laterality: Right;   LEFT EYE REMOVED Left 01/2018   [facility] [address].   LIGATION OF ARTERIOVENOUS  FISTULA Right 06/08/2018   Procedure: LIGATION OF ARTERIOVENOUS  FISTULA RIGHT ARM;  Surgeon: Chuck Hint, MD;  Location: [facility] OR;  Service:  Vascular;  Laterality: Right;   ORIF ANKLE FRACTURE Right 12/17/2018   Procedure: OPEN REDUCTION INTERNAL FIXATION (ORIF) ANKLE FRACTURE;  Surgeon: Felecia Shelling, DPM;  Location: [facility] OR;  Service: Podiatry;  Laterality: Right;   REVISON OF ARTERIOVENOUS FISTULA Left 09/26/2020   Procedure: LEFT ARTERIOVENOUS FISTULA  DISTAL REVASCULARIZATION AND LIGATION ( DRIL ) USING GREATER SAPHENOUS VEIN;  Surgeon: Maeola Harman, MD;  Location: [facility] OR;  Service: Vascular;  Laterality: Left;   SAVORY DILATION N/A 05/17/2013   Procedure: SAVORY DILATION;  Surgeon: West Bali, MD;  Location: [facility];  Service: Endoscopy;  Laterality: N/A;  14/15/16   SKIN FULL THICKNESS GRAFT Left 05/05/2018   Procedure: ABDOMINAL DERMIS FAT SKIN GRAFT FULL THICKNESS LEFT EYE;  Surgeon: Floydene Flock, MD;  Location: [facility] OR;  Service: Ophthalmology;  Laterality: Left;   TUBAL LIGATION     VEIN HARVEST Left 09/26/2020   Procedure: LEFT LEG GREATER SAPHENOUS VEIN HARVEST;  Surgeon: Maeola Harman, MD;  Location: [facility] OR;  Service: Vascular;  Laterality: Left;    Social History   Tobacco Use   Smoking status: Former    Current packs/day: 0.00    Average packs/day: 1.5 packs/day for 25.0 years (37.5 ttl pk-yrs)    Types: Cigarettes    Start date: 10/23/1982    Quit date: 10/23/2007    Years since quitting: 15.3   Smokeless tobacco: Never  Vaping Use   Vaping status: Never Used  Substance Use Topics   Alcohol use: No   Drug use: No    Family History  Adopted: Yes  Family history unknown: Yes    Allergies  Allergen Reactions   Codeine Nausea And Vomiting   Tape Rash and Other (See Comments)  Pull skin off.  Paper tape is ok    Health Maintenance  Topic Date Due   FOOT EXAM  Never done   DTaP/Tdap/Td (1 - Tdap) Never done   PAP SMEAR-Modifier  Never done   Zoster Vaccines- Shingrix (1 of 2) Never done   OPHTHALMOLOGY EXAM  10/03/2020   Colonoscopy  12/10/2021   COVID-19 Vaccine (1 -  2023-24 season) Never done   INFLUENZA VACCINE  02/26/2023   HEMOGLOBIN A1C  05/06/2023   MAMMOGRAM  05/31/2023   Hepatitis C Screening  Completed   HIV Screening  Completed   HPV VACCINES  Aged Out   Lung Cancer Screening  Discontinued    Objective: BP (!) 177/92   Pulse 84   Resp 16   Ht 5\' 7"  (1.702 m)   Wt 262 lb 11.2 oz (119.2 kg)   LMP 08/03/2008 (Exact Date)   SpO2 99%   BMI 41.14 kg/m   Physical Exam Constitutional:      Appearance: Normal appearance.  HENT:     Head: Normocephalic and atraumatic.      Mouth: Mucous membranes are moist.  Eyes:    Conjunctiva/sclera: Conjunctivae normal.     Pupils: Pupils are equal, round, and bilaterally symmetrical  Cardiovascular:     Rate and Rhythm: Normal rate and regular rhythm.     Heart sounds: s1s2  Pulmonary:     Effort: Pulmonary effort is normal.     Breath sounds: Normal breath sounds.   Abdominal:     General: Non distended     Palpations: soft.   Musculoskeletal:        General: Normal range of motion. Sitting in a wheel chair  Skin:    General: Skin is warm and dry.     Comments: healing abdominal wall wound with no signs of acute infection. No drainage, redness, swelling or tenderness or fluctuance. Left AVF with thrill    Neurological:     General: grossly non focal     Mental Status: awake, alert and oriented to person, place, and time.   Psychiatric:        Mood and Affect: Mood normal.   Lab Results Lab Results  Component Value Date   WBC 5.9 01/06/2023   HGB 10.2 (L) 01/06/2023   HCT 33.1 (L) 01/06/2023   MCV 88.5 01/06/2023   PLT 149 (L) 01/06/2023    Lab Results  Component Value Date   CREATININE 10.19 (H) 01/06/2023   BUN 40 (H) 01/06/2023   NA 138 01/06/2023   K 4.2 01/06/2023   CL 92 (L) 01/06/2023   CO2 23 01/06/2023    Lab Results  Component Value Date   ALT 8 01/06/2023   AST 7 (L) 01/06/2023   ALKPHOS 109 01/06/2023   BILITOT 1.1 01/06/2023    Lab Results   Component Value Date   CHOL 95 10/28/2016   HDL 31 (L) 10/28/2016   LDLCALC 40 10/28/2016   TRIG 122 10/28/2016   CHOLHDL 3.1 10/28/2016   Lab Results  Component Value Date   LABRPR Non Reactive 11/15/2016   No results found for: "HIV1RNAQUANT", "HIV1RNAVL", "CD4TABS"   Assessment/Plan 41 Y O female with prior h/o Bipolar 1 d/o/Depression, ESRD on HD, CHF, COPD, DM2 w retinopathy, HTN, Gout, Obesity/OSA, Non alcoholic liver cirrhosis with   # Lower abdominal wall wound Healing s/p prior abtx No indication for further abtx Discussed however at risk for infection until wound closed and discussed to continue wound care  as well as fu with wound care center. Discussed warning signs and symptoms of infection to seek immediate attention  Fu as needed   # Morbid Obesity Will benefit from weight loss   I have personally spent 69 minutes involved in face-to-face and non-face-to-face activities for this patient on the day of the visit. Professional time spent includes the following activities: Preparing to see the patient (review of tests), Obtaining and/or reviewing separately obtained history (admission/discharge record), Performing a medically appropriate examination and/or evaluation , Ordering medications/tests/procedures, referring and communicating with other health care professionals, Documenting clinical information in the EMR, Independently interpreting results (not separately reported), Communicating results to the patient/family/caregiver, Counseling and educating the patient/family/caregiver and Care coordination (not separately reported).   Victoriano Lain, MD [facility] 03/11/2023, 3:06 PM

## 2023-03-18 ENCOUNTER — Ambulatory Visit (HOSPITAL_COMMUNITY): Payer: Medicaid Other | Admitting: Physical Therapy

## 2023-03-25 ENCOUNTER — Ambulatory Visit (HOSPITAL_COMMUNITY): Payer: Medicaid Other

## 2023-03-26 ENCOUNTER — Ambulatory Visit: Payer: Self-pay

## 2023-03-26 ENCOUNTER — Ambulatory Visit
Admission: RE | Admit: 2023-03-26 | Discharge: 2023-03-26 | Disposition: A | Discharge status: Home / Self Care | Payer: Medicaid Other | Source: Ambulatory Visit | Attending: Family Medicine | Admitting: Family Medicine

## 2023-03-26 VITALS — BP 186/82 | HR 82 | Temp 99.5°F | Resp 16

## 2023-03-26 DIAGNOSIS — J069 Acute upper respiratory infection, unspecified: Secondary | ICD-10-CM | POA: Diagnosis not present

## 2023-03-26 DIAGNOSIS — R0602 Shortness of breath: Secondary | ICD-10-CM | POA: Diagnosis present

## 2023-03-26 DIAGNOSIS — U071 COVID-19: Secondary | ICD-10-CM | POA: Insufficient documentation

## 2023-03-26 DIAGNOSIS — J441 Chronic obstructive pulmonary disease with (acute) exacerbation: Secondary | ICD-10-CM

## 2023-03-26 NOTE — ED Triage Notes (Signed)
Pt reports she has a cough with mucus, SOB and  runny nose x 3 days  Has been drinking pedialyte but it makes her sick

## 2023-03-26 NOTE — Discharge Instructions (Signed)
We should have your COVID test back tomorrow and someone will call if positive.  Take over-the-counter cold and congestion remedies and use your albuterol and Advair, follow-up for significantly worsening symptoms.

## 2023-03-26 NOTE — ED Provider Notes (Signed)
[facility]    CSN: [id number] Arrival date & time: 03/26/23  1654      History   Chief Complaint Chief Complaint  Patient presents with   Cough    Cough issia with breathing head congestion - Entered by patient    HPI Martha [name] is a 57 y.o. female.   Patient with complicated medical history to include COPD, diabetes, end-stage renal disease on dialysis, CHF, hypertension, nonalcoholic cirrhosis presenting today with 3-day history of rhinorrhea, chest tightness, shortness of breath, productive cough.  Denies fever, chills, chest pain, abdominal pain, nausea vomiting or diarrhea.  So far trying Tylenol cold and sinus with mild temporary benefit.  No known sick contacts recently.    Past Medical History:  Diagnosis Date   Acid reflux    takes Tums   Anemia    Arthritis    Bipolar 1 disorder (HCC)    Blindness of left eye    left eye removed   Carpal tunnel syndrome, bilateral    Cervical radiculopathy    CHF (congestive heart failure) (HCC)    Chronic kidney disease    Stage 5- 01/25/17   Constipation    COPD (chronic obstructive pulmonary disease) (HCC) 2014   bronchitis   Degenerative disc disease, thoracic    Depression    Diabetes mellitus    Type II   Diabetic retinopathy (HCC)    Dyspnea    when walking   End stage kidney disease (HCC)    M, W, [facility]   Gout    Hypertension    Non-alcoholic cirrhosis (HCC)    Noncompliance with medication regimen    Noncompliance with medication regimen    Obesity (BMI 30-39.9)    OSA (obstructive sleep apnea)    cpap   Panic attack    RLS (restless legs syndrome)     Patient Active Problem List   Diagnosis Date Noted   Open abdominal wall wound 03/11/2023   Medication management 03/11/2023   Opioid dependence (HCC) 10/18/2021   Cellulitis and abscess of hand 10/18/2021   Left hand pain 10/17/2021   Cellulitis of left hand 10/17/2021   ESRD (end stage renal disease) (HCC) 09/26/2020    Hypoalbuminemia 07/06/2020   Acute respiratory failure with hypoxia (HCC) 07/06/2020   CAP (community acquired pneumonia) 07/05/2020   Trimalleolar fracture of ankle, closed, right, initial encounter 12/17/2018   Pain and swelling of ankle, right 12/16/2018   Metabolic encephalopathy 09/26/2018   SIRS (systemic inflammatory response syndrome) (HCC) 06/23/2018   UTI (urinary tract infection) 02/26/2018   Volume overload 02/25/2018   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   Dialysis patient (HCC) 12/13/2017   ESRD (end stage renal disease) on dialysis (HCC)    Chronic diarrhea 10/08/2017   Anemia in chronic kidney disease 05/24/2017   Chronic obstructive pulmonary disease (HCC) 05/24/2017   Heart failure (HCC) 05/24/2017   Osteopathy in diseases classified elsewhere, unspecified site 05/24/2017   Renal failure (ARF), acute on chronic (HCC) 04/22/2017   Anasarca associated with disorder of kidney 04/20/2017   Protein-calorie malnutrition, severe 11/29/2016   HTN (hypertension), malignant 11/20/2016   Bipolar 1 disorder (HCC) 11/20/2016   Noncompliance with medication regimen 11/20/2016   Panic attack 11/20/2016   Controlled type 2 diabetes mellitus without complication, without long-term current use of insulin (HCC) 10/27/2016   Sensory disturbance 10/27/2016   Left hemiparesis (HCC) 10/27/2016   Cholelithiasis 04/26/2014   Hyponatremia 04/26/2014   Gallstones 04/06/2014  Diabetic ulcer of left great toe (HCC) 10/09/2013   Type 2 diabetes mellitus treated with insulin (HCC) 05/30/2013   Hyperglycemia due to diabetes mellitus (HCC) 05/30/2013   Internal hemorrhoids with other complication 04/28/2013   Umbilical hernia without mention of obstruction or gangrene 04/15/2013   Anemia 04/15/2013   Obesity, Class III, BMI 40-49.9 (morbid obesity) (HCC) 10/08/2012   DM 05/13/2010   Hyperlipidemia 05/13/2010   Morbid obesity (HCC) 05/13/2010   Depression with anxiety  05/13/2010   RESTLESS LEG SYNDROME 05/13/2010   Essential hypertension 05/13/2010   GERD 05/13/2010   RECTAL BLEEDING 05/13/2010   OSA on CPAP 05/13/2010   Dysphagia, oropharyngeal phase 05/13/2010    Past Surgical History:  Procedure Laterality Date   AORTIC ARCH ANGIOGRAPHY N/A 09/11/2020   Procedure: AORTIC ARCH ANGIOGRAPHY;  Surgeon: Nada Libman, MD;  [facility]: [facility];  Service: Cardiovascular;  Laterality: N/A;   AV FISTULA PLACEMENT Right 01/27/2017   Procedure: ARTERIOVENOUS (AV) FISTULA CREATION-RIGHT ARM;  Surgeon: Sherren Kerns, MD;  Location: [facility] OR;  Service: Vascular;  Laterality: Right;   AV FISTULA PLACEMENT Left 08/03/2018   Procedure: LEFT ARTERIOVENOUS (AV) FISTULA CREATION;  Surgeon: Sherren Kerns, MD;  Location: [facility] OR;  Service: Vascular;  Laterality: Left;   BASCILIC VEIN TRANSPOSITION Left 04/26/2019   Procedure: SECOND STAGE BASILIC VEIN TRANSPOSITION LEFT ARM;  Surgeon: Sherren Kerns, MD;  Location: [facility] OR;  Service: Vascular;  Laterality: Left;   BIOPSY N/A 05/17/2013   Procedure: BIOPSY;  Surgeon: West Bali, MD;  Location: [facility];  Service: Endoscopy;  Laterality: N/A;   CARPAL TUNNEL RELEASE Left 11/25/2018   Procedure: LEFT CARPAL TUNNEL RELEASE;  Surgeon: Dairl Ponder, MD;  Location: [facility] OR;  Service: Orthopedics;  Laterality: Left;   CATARACT EXTRACTION Right    CHOLECYSTECTOMY N/A 04/27/2014   Procedure: LAPAROSCOPIC CHOLECYSTECTOMY;  Surgeon: Marlane Hatcher, MD;  Location: [facility];  Service: General;  Laterality: N/A;   COLONOSCOPY  06/06/2010   WUJ:[id number] bleeding secondary to internal hemorrhoids but incomplete evaluation secondary to poor right colon prep/small rectal and sigmoid colon polyps (hyperplastic). PROPOFOL   COLONOSCOPY  May 2013   Dr. Gilliam/[facility]: 5 mm a descending colon polyp, hyperplastic. Adequate bowel prep.   ENDOMETRIAL ABLATION     ENUCLEATION Left 02/04/2018   ENUCLEATION WITH PLACEMENT OF IMPLANT  LEFT EYE   ENUCLEATION Left 02/04/2018   Procedure: ENUCLEATION WITH PLACEMENT OF IMPLANT LEFT EYE;  Surgeon: Floydene Flock, MD;  Location: [facility] OR;  Service: Ophthalmology;  Laterality: Left;   ESOPHAGOGASTRODUODENOSCOPY  06/06/2010   NWG:NFAOZHY erythema and edema of body of stomach, with sessile polypoid lesions. bx benign. no h.pylori   ESOPHAGOGASTRODUODENOSCOPY (EGD) WITH PROPOFOL N/A 05/17/2013   Dr. Darrick Penna: normal esophagus, moderate nodular gastritis, negative path, empiric Savary dilation   EYE SURGERY  07/2017   sx for glaucoma   FLEXIBLE SIGMOIDOSCOPY N/A 05/17/2013   Dr. Darrick Penna: moderate sized internal hemorrhoids   HEMORRHOID BANDING N/A 05/17/2013   Procedure: HEMORRHOID BANDING;  Surgeon: West Bali, MD;  Location: [facility];  Service: Endoscopy;  Laterality: N/A;  2 bands placed   INCISION AND DRAINAGE ABSCESS Left 10/11/2013   Procedure: INCISION AND DRAINAGE AND DEBRIDEMENT LEFT BREAST  ABSCESS;  Surgeon: Marlane Hatcher, MD;  Location: [facility];  Service: General;  Laterality: Left;   INSERTION OF DIALYSIS CATHETER N/A 06/08/2018   Procedure: INSERTION OF Right internal jugular TUNNELED  DIALYSIS CATHETER;  Surgeon: Chuck Hint,  MD;  Location: [facility] OR;  Service: Vascular;  Laterality: N/A;   IRRIGATION AND DEBRIDEMENT ABSCESS Right 06/01/2013   Procedure: INCISION AND DRAINAGE AND DEBRIDEMENT ABSCESS RIGHT BREAST;  Surgeon: Marlane Hatcher, MD;  Location: [facility];  Service: General;  Laterality: Right;   LEFT EYE REMOVED Left 01/2018   [facility] [address].   LIGATION OF ARTERIOVENOUS  FISTULA Right 06/08/2018   Procedure: LIGATION OF ARTERIOVENOUS  FISTULA RIGHT ARM;  Surgeon: Chuck Hint, MD;  Location: [facility] OR;  Service: Vascular;  Laterality: Right;   ORIF ANKLE FRACTURE Right 12/17/2018   Procedure: OPEN REDUCTION INTERNAL FIXATION (ORIF) ANKLE FRACTURE;  Surgeon: Felecia Shelling, DPM;  Location: [facility];  Service: Podiatry;   Laterality: Right;   REVISON OF ARTERIOVENOUS FISTULA Left 09/26/2020   Procedure: LEFT ARTERIOVENOUS FISTULA  DISTAL REVASCULARIZATION AND LIGATION ( DRIL ) USING GREATER SAPHENOUS VEIN;  Surgeon: Maeola Harman, MD;  Location: [facility] OR;  Service: Vascular;  Laterality: Left;   SAVORY DILATION N/A 05/17/2013   Procedure: SAVORY DILATION;  Surgeon: West Bali, MD;  Location: [facility];  Service: Endoscopy;  Laterality: N/A;  14/15/16   SKIN FULL THICKNESS GRAFT Left 05/05/2018   Procedure: ABDOMINAL DERMIS FAT SKIN GRAFT FULL THICKNESS LEFT EYE;  Surgeon: Floydene Flock, MD;  Location: [facility] OR;  Service: Ophthalmology;  Laterality: Left;   TUBAL LIGATION     VEIN HARVEST Left 09/26/2020   Procedure: LEFT LEG GREATER SAPHENOUS VEIN HARVEST;  Surgeon: Maeola Harman, MD;  Location[facility] OR;  Service: Vascular;  Laterality: Left;    OB History     Gravida  2   Para  2   Term  1   Preterm  1   AB      Living  2      SAB      IAB      Ectopic      Multiple      Live Births               Home Medications    Prior to Admission medications   Medication Sig Start Date End Date Taking? Authorizing Provider  ADVAIR DISKUS 250-50 MCG/ACT AEPB Inhale 1 puff into the lungs 2 (two) times daily. 07/01/22   [provider]  albuterol (PROVENTIL) (2.5 MG/3ML) 0.083% nebulizer solution Take 3 mLs (2.5 mg total) by nebulization every 12 (twelve) hours as needed for wheezing or shortness of breath. 08/05/22   Valentino Nose, NP  ALPRAZolam Prudy Feeler) 1 MG tablet Take 1 tablet (1 mg total) by mouth 4 (four) times daily as needed for anxiety. Patient taking differently: Take 1 mg by mouth 4 (four) times daily. 07/12/20   Rhetta Mura, MD  Artificial Tear Ointment (DRY EYES OP) Place 1 drop into the right eye 2 (two) times daily.    [provider]  atorvastatin (LIPITOR) 20 MG tablet Take by mouth. 10/21/18   [provider]  atropine  (ISOPTO ATROPINE) 1 % ophthalmic solution Apply to eye. 12/27/19   [provider]  Blood Glucose Monitoring Suppl (FORA V30A BLOOD GLUCOSE SYSTEM) DEVI  09/22/20   [provider]  brimonidine (ALPHAGAN) 0.2 % ophthalmic solution PLACE 1 DROP INTO THE RIGHT EYE 3 TIMES A DAY. 05/24/21   [provider]  busPIRone (BUSPAR) 7.5 MG tablet Take 1 tablet (7.5 mg total) by mouth 2 (two) times daily. 07/12/20   Rhetta Mura, MD  calcium carbonate (TUMS  EX) 750 MG chewable tablet Chew 1-3 tablets by mouth See admin instructions. Take 3 tablets with each meal & take 1 tablets with each snack    [provider]  canagliflozin (INVOKANA) 300 MG TABS tablet Take by mouth. 03/02/13   [provider]  cinacalcet (SENSIPAR) 60 MG tablet Take 60 mg by mouth daily.    [provider]  colchicine 0.6 MG tablet Take by mouth. 09/30/18   [provider]  dextromethorphan (DELSYM) 30 MG/5ML liquid Take 5 mLs (30 mg total) by mouth 2 (two) times daily as needed for cough. 11/05/22   Johnson, Clanford L, MD  dorzolamide-timolol (COSOPT) 22.3-6.8 MG/ML ophthalmic solution Place 1 drop into the right eye in the morning and at bedtime. 06/01/20   [provider]  doxercalciferol (HECTOROL) 4 MCG/2ML injection Inject 0.5 mLs (1 mcg total) into the vein every Monday, Wednesday, and Friday with hemodialysis. 12/03/18   Kari Baars, MD  fluocinonide cream (LIDEX) 0.05 % Apply 1 Application topically 2 (two) times daily. 09/23/22   [provider]  fluticasone (CUTIVATE) 0.05 % cream Apply topically. 06/26/22   [provider]  FORACARE PREMIUM V10 TEST test strip  09/22/20   [provider]  gabapentin (NEURONTIN) 100 MG capsule Take 1 capsule (100 mg total) by mouth at bedtime. Patient taking differently: Take 300 mg by mouth at bedtime. 07/12/20   Rhetta Mura, MD  HYDROcodone-acetaminophen (NORCO) 10-325 MG tablet Take 1  tablet by mouth 4 (four) times daily. 08/30/20   [provider]  hydrOXYzine (ATARAX) 25 MG tablet Take 25 mg by mouth 2 (two) times daily. 07/11/22   [provider]  insulin aspart (NOVOLOG) 100 UNIT/ML injection Inject 4-7 Units into the skin 3 (three) times daily with meals. Sliding scale insulin 11/05/22   Johnson, Clanford L, MD  Insulin Glargine (BASAGLAR KWIKPEN) 100 UNIT/ML Inject 30 Units into the skin at bedtime. 07/12/20   Rhetta Mura, MD  ketoconazole (NIZORAL) 2 % cream Apply topically. 10/08/12   [provider]  latanoprost (XALATAN) 0.005 % ophthalmic solution Place 1 drop into the right eye 2 (two) times daily. 08/22/20   [provider]  leptospermum manuka honey (MEDIHONEY) PSTE paste Apply 1 Application topically daily. 01/06/23   Roemhildt, Lorin T, PA-C  lidocaine-prilocaine (EMLA) cream Apply 1 application topically as needed (topical anesthesia for hemodialysis if Gebauers and Lidocaine injection are ineffective.). Patient taking differently: Apply 1 application  topically daily as needed (dialysis.). 04/25/17   Johnson, Clanford L, MD  omeprazole (PRILOSEC) 40 MG capsule Take 40 mg by mouth daily.    [provider]  ondansetron (ZOFRAN) 4 MG tablet TAKE (1) TABLET THREE TIMES DAILY AS NEEDED FOR NAUSEA. 07/30/22   [provider]  oxyCODONE (ROXICODONE) 5 MG immediate release tablet Take 1 tablet (5 mg total) by mouth every 6 (six) hours as needed for severe pain or breakthrough pain. 01/06/23   Roemhildt, Lorin T, PA-C  PARoxetine (PAXIL) 20 MG tablet Take 1 tablet (20 mg total) by mouth every evening. This is to prevent panic attacks Patient taking differently: Take 20 mg by mouth at bedtime. 07/12/20   Rhetta Mura, MD  promethazine (PHENERGAN) 25 MG tablet Take 1 tablet (25 mg total) by mouth every 6 (six) hours as needed for nausea or vomiting. 01/06/23   Roemhildt, Lora Paula, PA-C    Family History Family  History  Adopted: Yes  Family history unknown: Yes    Social History Social History  Tobacco Use   Smoking status: Former    Current packs/day: 0.00    Average packs/day: 1.5 packs/day for 25.0 years (37.5 ttl pk-yrs)    Types: Cigarettes    Start date: 10/23/1982    Quit date: 10/23/2007    Years since quitting: 15.4   Smokeless tobacco: Never  Vaping Use   Vaping status: Never Used  Substance Use Topics   Alcohol use: No   Drug use: No     Allergies   Codeine and Tape   Review of Systems Review of Systems HPI  Physical Exam Triage Vital Signs ED Triage Vitals  Encounter Vitals Group     BP 03/26/23 1659 (!) 186/82     Systolic BP Percentile --      Diastolic BP Percentile --      Pulse Rate 03/26/23 1659 82     Resp 03/26/23 1659 16     Temp 03/26/23 1659 99.5 F (37.5 C)     Temp Source 03/26/23 1659 Oral     SpO2 03/26/23 1659 92 %     Weight --      Height --      Head Circumference --      Peak Flow --      Pain Score 03/26/23 1702 0     Pain Loc --      Pain Education --      Exclude from Growth Chart --    No data found.  Updated Vital Signs BP (!) 186/82 (BP Location: Right Arm)   Pulse 82   Temp 99.5 F (37.5 C) (Oral)   Resp 16   LMP 08/03/2008 (Exact Date)   SpO2 92%   Visual Acuity Right Eye Distance:   Left Eye Distance:   Bilateral Distance:    Right Eye Near:   Left Eye Near:    Bilateral Near:     Physical Exam Vitals and nursing note reviewed.  Constitutional:      Appearance: Normal appearance.  HENT:     Head: Atraumatic.     Right Ear: Tympanic membrane and external ear normal.     Left Ear: Tympanic membrane and external ear normal.     Nose: Rhinorrhea present.     Mouth/Throat:     Mouth: Mucous membranes are moist.     Pharynx: Posterior oropharyngeal erythema present.  Eyes:     Extraocular Movements: Extraocular movements intact.     Conjunctiva/sclera: Conjunctivae normal.  Cardiovascular:     Rate  and Rhythm: Normal rate and regular rhythm.     Heart sounds: Normal heart sounds.  Pulmonary:     Effort: Pulmonary effort is normal.     Breath sounds: No wheezing or rales.     Comments: Decreased breath sounds bilaterally Musculoskeletal:        General: Normal range of motion.     Cervical back: Normal range of motion and neck supple.  Skin:    General: Skin is warm and dry.  Neurological:     Mental Status: She is alert and oriented to person, place, and time.  Psychiatric:        Mood and Affect: Mood normal.        Thought Content: Thought content normal.      UC Treatments / Results  Labs (all labs ordered are listed, but only abnormal results are displayed) Labs Reviewed  SARS CORONAVIRUS 2 (TAT 6-24 HRS)    EKG   Radiology No results found.  Procedures Procedures (including critical care time)  Medications Ordered in UC Medications - No data to display  Initial Impression / Assessment and Plan / UC Course  I have reviewed the triage vital signs and the nursing notes.  Pertinent labs & imaging results that were available during my care of the patient were reviewed by me and considered in my medical decision making (see chart for details).     Hypertensive in triage, and oxygen saturation 92% on room air.  She is breathing comfortably and in no acute distress.  Continue Advair, increase albuterol to every 4 hours as needed for COPD exacerbation secondary to viral respiratory infection.  COVID testing pending, good candidate for molnupiravir if positive for COVID.  Discussed supportive over-the-counter medications and home care.  Return for worsening symptoms.  Final Clinical Impressions(s) / UC Diagnoses   Final diagnoses:  Viral URI with cough  COPD exacerbation [facility])     Discharge Instructions      We should have your COVID test back tomorrow and someone will call if positive.  Take over-the-counter cold and congestion remedies and use your  albuterol and Advair, follow-up for significantly worsening symptoms.    ED Prescriptions   None    PDMP not reviewed this encounter.   Particia Nearing, [place] 03/26/23 1728

## 2023-03-27 ENCOUNTER — Telehealth: Payer: Self-pay

## 2023-03-27 ENCOUNTER — Ambulatory Visit (HOSPITAL_COMMUNITY): Payer: Medicaid Other

## 2023-03-27 LAB — SARS CORONAVIRUS 2 (TAT 6-24 HRS): SARS Coronavirus 2: POSITIVE — AB

## 2023-03-27 MED ORDER — MOLNUPIRAVIR EUA 200MG CAPSULE: 4.0000 | ORAL_CAPSULE | Freq: Two times a day (BID) | ORAL | 0 refills | Status: AC

## 2023-03-27 NOTE — Telephone Encounter (Signed)
Reached pt by phone. Notified of positive result and current CDC recommendations. Pt verbalized understanding. Per provider's note, pt is candidate for anti-virals. Reviewed with patient, verified pharmacy, prescription sent.

## 2023-04-17 ENCOUNTER — Telehealth: Payer: Self-pay

## 2023-04-17 ENCOUNTER — Ambulatory Visit
Admission: RE | Admit: 2023-04-17 | Discharge: 2023-04-17 | Disposition: A | Discharge status: Home / Self Care | Payer: Medicaid Other | Source: Ambulatory Visit | Attending: Family Medicine | Admitting: Family Medicine

## 2023-04-17 VITALS — BP 170/84 | HR 78 | Temp 98.5°F | Resp 18

## 2023-04-17 DIAGNOSIS — L03119 Cellulitis of unspecified part of limb: Secondary | ICD-10-CM | POA: Diagnosis not present

## 2023-04-17 MED ORDER — CEPHALEXIN 500 MG PO CAPS: 500.0000 mg | ORAL_CAPSULE | Freq: Two times a day (BID) | ORAL | 0 refills | Status: DC

## 2023-04-17 NOTE — Discharge Instructions (Addendum)
Elevate the foot at rest to help with swelling, and take the full course of antibiotics.  Follow-up with podiatry as scheduled

## 2023-04-17 NOTE — ED Provider Notes (Signed)
[facility]    CSN: [id number] Arrival date & time: 04/17/23  1148      History   Chief Complaint Chief Complaint  Patient presents with   Foot Injury    Right foot swollen with red mark on top of foot not sure cause of injury - Entered by patient    HPI Martha [name] is a 57 y.o. female.   Patient presenting today with 2 to 3-day history of right foot pain, swelling, redness.  She denies any known injury though states she does tend to bump this foot with her cane and walker frequently.  Denies numbness, tingling, loss of range of motion, fever, chills, sweats.  So far not tried anything over-the-counter for symptoms.  She does have a complicated past medical history to include end-stage renal disease on dialysis, diabetes, heart failure, anemia.  She got appointment with her podiatrist next week.    Past Medical History:  Diagnosis Date   Acid reflux    takes Tums   Anemia    Arthritis    Bipolar 1 disorder (HCC)    Blindness of left eye    left eye removed   Carpal tunnel syndrome, bilateral    Cervical radiculopathy    CHF (congestive heart failure) (HCC)    Chronic kidney disease    Stage 5- 01/25/17   Constipation    COPD (chronic obstructive pulmonary disease) (HCC) 2014   bronchitis   Degenerative disc disease, thoracic    Depression    Diabetes mellitus    Type II   Diabetic retinopathy (HCC)    Dyspnea    when walking   End stage kidney disease (HCC)    M, W, F [facility]   Gout    Hypertension    Non-alcoholic cirrhosis (HCC)    Noncompliance with medication regimen    Noncompliance with medication regimen    Obesity (BMI 30-39.9)    OSA (obstructive sleep apnea)    cpap   Panic attack    RLS (restless legs syndrome)     Patient Active Problem List   Diagnosis Date Noted   Open abdominal wall wound 03/11/2023   Medication management 03/11/2023   Opioid dependence (HCC) 10/18/2021   Cellulitis and abscess of hand 10/18/2021    Left hand pain 10/17/2021   Cellulitis of left hand 10/17/2021   ESRD (end stage renal disease) (HCC) 09/26/2020   Hypoalbuminemia 07/06/2020   Acute respiratory failure with hypoxia (HCC) 07/06/2020   CAP (community acquired pneumonia) 07/05/2020   Trimalleolar fracture of ankle, closed, right, initial encounter 12/17/2018   Pain and swelling of ankle, right 12/16/2018   Metabolic encephalopathy 09/26/2018   SIRS (systemic inflammatory response syndrome) (HCC) 06/23/2018   UTI (urinary tract infection) 02/26/2018   Volume overload 02/25/2018   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   Dialysis patient (HCC) 12/13/2017   ESRD (end stage renal disease) on dialysis (HCC)    Chronic diarrhea 10/08/2017   Anemia in chronic kidney disease 05/24/2017   Chronic obstructive pulmonary disease (HCC) 05/24/2017   Heart failure (HCC) 05/24/2017   Osteopathy in diseases classified elsewhere, unspecified site 05/24/2017   Renal failure (ARF), acute on chronic (HCC) 04/22/2017   Anasarca associated with disorder of kidney 04/20/2017   Protein-calorie malnutrition, severe 11/29/2016   HTN (hypertension), malignant 11/20/2016   Bipolar 1 disorder (HCC) 11/20/2016   Noncompliance with medication regimen 11/20/2016   Panic attack 11/20/2016   Controlled type 2 diabetes mellitus  without complication, without long-term current use of insulin (HCC) 10/27/2016   Sensory disturbance 10/27/2016   Left hemiparesis (HCC) 10/27/2016   Cholelithiasis 04/26/2014   Hyponatremia 04/26/2014   Gallstones 04/06/2014   Diabetic ulcer of left great toe (HCC) 10/09/2013   Type 2 diabetes mellitus treated with insulin (HCC) 05/30/2013   Hyperglycemia due to diabetes mellitus (HCC) 05/30/2013   Internal hemorrhoids with other complication 04/28/2013   Umbilical hernia without mention of obstruction or gangrene 04/15/2013   Anemia 04/15/2013   Obesity, Class III, BMI 40-49.9 (morbid obesity) (HCC)  10/08/2012   DM 05/13/2010   Hyperlipidemia 05/13/2010   Morbid obesity (HCC) 05/13/2010   Depression with anxiety 05/13/2010   RESTLESS LEG SYNDROME 05/13/2010   Essential hypertension 05/13/2010   GERD 05/13/2010   RECTAL BLEEDING 05/13/2010   OSA on CPAP 05/13/2010   Dysphagia, oropharyngeal phase 05/13/2010    Past Surgical History:  Procedure Laterality Date   AORTIC ARCH ANGIOGRAPHY N/A 09/11/2020   Procedure: AORTIC ARCH ANGIOGRAPHY;  Surgeon: Nada Libman, MD;  Location: [facility];  Service: Cardiovascular;  Laterality: N/A;   AV FISTULA PLACEMENT Right 01/27/2017   Procedure: ARTERIOVENOUS (AV) FISTULA CREATION-RIGHT ARM;  Surgeon: Sherren Kerns, MD;  [facility];  Service: Vascular;  Laterality: Right;   AV FISTULA PLACEMENT Left 08/03/2018   Procedure: LEFT ARTERIOVENOUS (AV) FISTULA CREATION;  Surgeon: Sherren Kerns, MD;  Location: [facility] OR;  Service: Vascular;  Laterality: Left;   BASCILIC VEIN TRANSPOSITION Left 04/26/2019   Procedure: SECOND STAGE BASILIC VEIN TRANSPOSITION LEFT ARM;  Surgeon: Sherren Kerns, MD;  Location: [facility] OR;  Service: Vascular;  Laterality: Left;   BIOPSY N/A 05/17/2013   Procedure: BIOPSY;  Surgeon: West Bali, MD;  Location: [facility];  Service: Endoscopy;  Laterality: N/A;   CARPAL TUNNEL RELEASE Left 11/25/2018   Procedure: LEFT CARPAL TUNNEL RELEASE;  Surgeon: Dairl Ponder, MD;  Location: [facility] OR;  Service: Orthopedics;  Laterality: Left;   CATARACT EXTRACTION Right    CHOLECYSTECTOMY N/A 04/27/2014   Procedure: LAPAROSCOPIC CHOLECYSTECTOMY;  Surgeon: Marlane Hatcher, MD;  Location: [facility];  Service: General;  Laterality: N/A;   COLONOSCOPY  06/06/2010   [id number] bleeding secondary to internal hemorrhoids but incomplete evaluation secondary to poor right colon prep/small rectal and sigmoid colon polyps (hyperplastic). PROPOFOL   COLONOSCOPY  May 2013   Dr. Gilliam/[facility]: 5 mm a descending colon polyp, hyperplastic.  Adequate bowel prep.   ENDOMETRIAL ABLATION     ENUCLEATION Left 02/04/2018   ENUCLEATION WITH PLACEMENT OF IMPLANT LEFT EYE   ENUCLEATION Left 02/04/2018   Procedure: ENUCLEATION WITH PLACEMENT OF IMPLANT LEFT EYE;  Surgeon: Floydene Flock, MD;  Location: [facility] OR;  Service: Ophthalmology;  Laterality: Left;   ESOPHAGOGASTRODUODENOSCOPY  06/06/2010   [id number]:[id number] erythema and edema of body of stomach, with sessile polypoid lesions. bx benign. no h.pylori   ESOPHAGOGASTRODUODENOSCOPY (EGD) WITH PROPOFOL N/A 05/17/2013   Dr. Darrick Penna: normal esophagus, moderate nodular gastritis, negative path, empiric Savary dilation   EYE SURGERY  07/2017   sx for glaucoma   FLEXIBLE SIGMOIDOSCOPY N/A 05/17/2013   Dr. Darrick Penna: moderate sized internal hemorrhoids   HEMORRHOID BANDING N/A 05/17/2013   Procedure: HEMORRHOID BANDING;  Surgeon: West Bali, MD;  Location: [facility];  Service: Endoscopy;  Laterality: N/A;  2 bands placed   INCISION AND DRAINAGE ABSCESS Left 10/11/2013   Procedure: INCISION AND DRAINAGE AND DEBRIDEMENT LEFT BREAST  ABSCESS;  Surgeon: Marlane Hatcher, MD;  Location: [facility];  Service: General;  Laterality: Left;   INSERTION OF DIALYSIS CATHETER N/A 06/08/2018   Procedure: INSERTION OF Right internal jugular TUNNELED  DIALYSIS CATHETER;  Surgeon: Chuck Hint, MD;  Location: [facility] OR;  Service: Vascular;  Laterality: N/A;   IRRIGATION AND DEBRIDEMENT ABSCESS Right 06/01/2013   Procedure: INCISION AND DRAINAGE AND DEBRIDEMENT ABSCESS RIGHT BREAST;  Surgeon: Marlane Hatcher, MD;  Location: [facility];  Service: General;  Laterality: Right;   LEFT EYE REMOVED Left 01/2018   [facility] [address].   LIGATION OF ARTERIOVENOUS  FISTULA Right 06/08/2018   Procedure: LIGATION OF ARTERIOVENOUS  FISTULA RIGHT ARM;  Surgeon: Chuck Hint, MD;  Location: [facility] OR;  Service: Vascular;  Laterality: Right;   ORIF ANKLE FRACTURE Right 12/17/2018   Procedure: OPEN  REDUCTION INTERNAL FIXATION (ORIF) ANKLE FRACTURE;  Surgeon: Felecia Shelling, DPM;  Location: [facility];  Service: Podiatry;  Laterality: Right;   REVISON OF ARTERIOVENOUS FISTULA Left 09/26/2020   Procedure: LEFT ARTERIOVENOUS FISTULA  DISTAL REVASCULARIZATION AND LIGATION ( DRIL ) USING GREATER SAPHENOUS VEIN;  Surgeon: Maeola Harman, MD;  Location: [facility] OR;  Service: Vascular;  Laterality: Left;   SAVORY DILATION N/A 05/17/2013   Procedure: SAVORY DILATION;  Surgeon: West Bali, MD;  Location: [facility];  Service: Endoscopy;  Laterality: N/A;  14/15/16   SKIN FULL THICKNESS GRAFT Left 05/05/2018   Procedure: ABDOMINAL DERMIS FAT SKIN GRAFT FULL THICKNESS LEFT EYE;  Surgeon: Floydene Flock, MD;  Location: [facility] OR;  Service: Ophthalmology;  Laterality: Left;   TUBAL LIGATION     VEIN HARVEST Left 09/26/2020   Procedure: LEFT LEG GREATER SAPHENOUS VEIN HARVEST;  Surgeon: Maeola Harman, MD;  Location: [facility] OR;  Service: Vascular;  Laterality: Left;    OB History     Gravida  2   Para  2   Term  1   Preterm  1   AB      Living  2      SAB      IAB      Ectopic      Multiple      Live Births               Home Medications    Prior to Admission medications   Medication Sig Start Date End Date Taking? Authorizing Provider  ADVAIR DISKUS 250-50 MCG/ACT AEPB Inhale 1 puff into the lungs 2 (two) times daily. 07/01/22   [provider]  albuterol (PROVENTIL) (2.5 MG/3ML) 0.083% nebulizer solution Take 3 mLs (2.5 mg total) by nebulization every 12 (twelve) hours as needed for wheezing or shortness of breath. 08/05/22   Valentino Nose, NP  ALPRAZolam Prudy Feeler) 1 MG tablet Take 1 tablet (1 mg total) by mouth 4 (four) times daily as needed for anxiety. Patient taking differently: Take 1 mg by mouth 4 (four) times daily. 07/12/20   Rhetta Mura, MD  Artificial Tear Ointment (DRY EYES OP) Place 1 drop into the right eye 2 (two) times daily.     [provider]  atorvastatin (LIPITOR) 20 MG tablet Take by mouth. 10/21/18   [provider]  atropine (ISOPTO ATROPINE) 1 % ophthalmic solution Apply to eye. 12/27/19   [provider]  Blood Glucose Monitoring Suppl (FORA V30A BLOOD GLUCOSE SYSTEM) DEVI  09/22/20   [provider]  brimonidine (ALPHAGAN) 0.2 % ophthalmic solution PLACE 1 DROP INTO THE RIGHT EYE 3 TIMES A DAY.  05/24/21   [provider]  busPIRone (BUSPAR) 7.5 MG tablet Take 1 tablet (7.5 mg total) by mouth 2 (two) times daily. 07/12/20   Rhetta Mura, MD  calcium carbonate (TUMS EX) 750 MG chewable tablet Chew 1-3 tablets by mouth See admin instructions. Take 3 tablets with each meal & take 1 tablets with each snack    [provider]  canagliflozin (INVOKANA) 300 MG TABS tablet Take by mouth. 03/02/13   [provider]  cephALEXin (KEFLEX) 500 MG capsule Take 1 capsule (500 mg total) by mouth 2 (two) times daily. 04/17/23   Particia Nearing, PA-C  cinacalcet (SENSIPAR) 60 MG tablet Take 60 mg by mouth daily.    [provider]  colchicine 0.6 MG tablet Take by mouth. 09/30/18   [provider]  dextromethorphan (DELSYM) 30 MG/5ML liquid Take 5 mLs (30 mg total) by mouth 2 (two) times daily as needed for cough. 11/05/22   Johnson, Clanford L, MD  dorzolamide-timolol (COSOPT) 22.3-6.8 MG/ML ophthalmic solution Place 1 drop into the right eye in the morning and at bedtime. 06/01/20   [provider]  doxercalciferol (HECTOROL) 4 MCG/2ML injection Inject 0.5 mLs (1 mcg total) into the vein every Monday, Wednesday, and Friday with hemodialysis. 12/03/18   Kari Baars, MD  fluocinonide cream (LIDEX) 0.05 % Apply 1 Application topically 2 (two) times daily. 09/23/22   [provider]  fluticasone (CUTIVATE) 0.05 % cream Apply topically. 06/26/22   [provider]  FORACARE PREMIUM V10 TEST test strip  09/22/20   [provider]  gabapentin (NEURONTIN) 100 MG capsule Take 1 capsule (100 mg total) by mouth at bedtime. Patient taking differently: Take 300 mg by mouth at bedtime. 07/12/20   Rhetta Mura, MD  HYDROcodone-acetaminophen (NORCO) 10-325 MG tablet Take 1 tablet by mouth 4 (four) times daily. 08/30/20   [provider]  hydrOXYzine (ATARAX) 25 MG tablet Take 25 mg by mouth 2 (two) times daily. 07/11/22   [provider]  insulin aspart (NOVOLOG) 100 UNIT/ML injection Inject 4-7 Units into the skin 3 (three) times daily with meals. Sliding scale insulin 11/05/22   Johnson, Clanford L, MD  Insulin Glargine (BASAGLAR KWIKPEN) 100 UNIT/ML Inject 30 Units into the skin at bedtime. 07/12/20   Rhetta Mura, MD  ketoconazole (NIZORAL) 2 % cream Apply topically. 10/08/12   [provider]  latanoprost (XALATAN) 0.005 % ophthalmic solution Place 1 drop into the right eye 2 (two) times daily. 08/22/20   [provider]  leptospermum manuka honey (MEDIHONEY) PSTE paste Apply 1 Application topically daily. 01/06/23   Roemhildt, Lorin T, PA-C  lidocaine-prilocaine (EMLA) cream Apply 1 application topically as needed (topical anesthesia for hemodialysis if Gebauers and Lidocaine injection are ineffective.). Patient taking differently: Apply 1 application  topically daily as needed (dialysis.). 04/25/17   Johnson, Clanford L, MD  omeprazole (PRILOSEC) 40 MG capsule Take 40 mg by mouth daily.    [provider]  ondansetron (ZOFRAN) 4 MG tablet TAKE (1) TABLET THREE TIMES DAILY AS NEEDED FOR NAUSEA. 07/30/22   [provider]  oxyCODONE (ROXICODONE) 5 MG immediate release tablet Take 1 tablet (5 mg total) by mouth every 6 (six) hours as needed for severe pain or breakthrough pain. 01/06/23   Roemhildt, Lorin T, PA-C  PARoxetine (PAXIL) 20 MG tablet Take 1 tablet (20 mg total) by mouth every evening. This is to prevent panic attacks Patient taking differently:  Take 20 mg by mouth at bedtime.  07/12/20   Rhetta Mura, MD  promethazine (PHENERGAN) 25 MG tablet Take 1 tablet (25 mg total) by mouth every 6 (six) hours as needed for nausea or vomiting. 01/06/23   Roemhildt, Lorin T, PA-C    Family History Family History  Adopted: Yes  Family history unknown: Yes    Social History Social History   Tobacco Use   Smoking status: Former    Current packs/day: 0.00    Average packs/day: 1.5 packs/day for 25.0 years (37.5 ttl pk-yrs)    Types: Cigarettes    Start date: 10/23/1982    Quit date: 10/23/2007    Years since quitting: 15.4   Smokeless tobacco: Never  Vaping Use   Vaping status: Never Used  Substance Use Topics   Alcohol use: No   Drug use: No     Allergies   Codeine and Tape   Review of Systems Review of Systems Per HPI  Physical Exam Triage Vital Signs ED Triage Vitals  Encounter Vitals Group     BP 04/17/23 1229 (!) 170/84     Systolic BP Percentile --      Diastolic BP Percentile --      Pulse Rate 04/17/23 1229 78     Resp 04/17/23 1229 18     Temp 04/17/23 1229 98.5 F (36.9 C)     Temp Source 04/17/23 1229 Oral     SpO2 04/17/23 1229 98 %     Weight --      Height --      Head Circumference --      Peak Flow --      Pain Score 04/17/23 1230 5     Pain Loc --      Pain Education --      Exclude from Growth Chart --    No data found.  Updated Vital Signs BP (!) 170/84 (BP Location: Right Arm)   Pulse 78   Temp 98.5 F (36.9 C) (Oral)   Resp 18   LMP 08/03/2008 (Exact Date)   SpO2 98%   Visual Acuity Right Eye Distance:   Left Eye Distance:   Bilateral Distance:    Right Eye Near:   Left Eye Near:    Bilateral Near:     Physical Exam Vitals and nursing note reviewed.  Constitutional:      Appearance: Normal appearance. She is not ill-appearing.  HENT:     Head: Atraumatic.  Eyes:     Extraocular Movements: Extraocular movements intact.     Conjunctiva/sclera: Conjunctivae normal.   Cardiovascular:     Rate and Rhythm: Normal rate and regular rhythm.     Heart sounds: Normal heart sounds.  Pulmonary:     Effort: Pulmonary effort is normal.     Breath sounds: Normal breath sounds.  Musculoskeletal:        General: Swelling and tenderness present. No signs of injury.     Cervical back: Normal range of motion and neck supple.  Skin:    General: Skin is warm and dry.     Findings: Erythema present.     Comments: Diffuse erythema to the dorsal right foot, no obvious open wounds  Neurological:     Mental Status: She is alert and oriented to person, place, and time.     Motor: No weakness.     Comments: Right lower extremity neurovascularly intact  Psychiatric:        Mood and Affect: Mood normal.  Thought Content: Thought content normal.        Judgment: Judgment normal.      UC Treatments / Results  Labs (all labs ordered are listed, but only abnormal results are displayed) Labs Reviewed - No data to display  EKG   Radiology No results found.  Procedures Procedures (including critical care time)  Medications Ordered in UC Medications - No data to display  Initial Impression / Assessment and Plan / UC Course  I have reviewed the triage vital signs and the nursing notes.  Pertinent labs & imaging results that were available during my care of the patient were reviewed by me and considered in my medical decision making (see chart for details).     X-ray imaging deferred with shared decision making given the lack of traumatic injury, good range of motion and superficial quality of pain.  Suspect cellulitis, treat with Keflex, Epsom salt soaks, compression and close podiatry follow-up for recheck.  Return for worsening symptoms anytime.  Final Clinical Impressions(s) / UC Diagnoses   Final diagnoses:  Cellulitis of foot     Discharge Instructions      Elevate the foot at rest to help with swelling, and take the full course of  antibiotics.  Follow-up with podiatry as scheduled    ED Prescriptions     Medication Sig Dispense Auth. Provider   cephALEXin (KEFLEX) 500 MG capsule Take 1 capsule (500 mg total) by mouth 2 (two) times daily. 14 capsule Particia Nearing, [place]      PDMP not reviewed this encounter.   Particia Nearing, [place] 04/17/23 2031

## 2023-04-17 NOTE — ED Triage Notes (Signed)
Pt reports she has right foot swelling and pain x 2-3 days   Denies injury

## 2023-04-21 ENCOUNTER — Ambulatory Visit (INDEPENDENT_AMBULATORY_CARE_PROVIDER_SITE_OTHER): Payer: Medicaid Other

## 2023-04-21 ENCOUNTER — Encounter: Payer: Self-pay | Admitting: Podiatry

## 2023-04-21 ENCOUNTER — Ambulatory Visit: Payer: Medicaid Other | Admitting: Podiatry

## 2023-04-21 DIAGNOSIS — M7751 Other enthesopathy of right foot: Secondary | ICD-10-CM

## 2023-04-21 DIAGNOSIS — M779 Enthesopathy, unspecified: Secondary | ICD-10-CM | POA: Diagnosis not present

## 2023-04-21 DIAGNOSIS — L97521 Non-pressure chronic ulcer of other part of left foot limited to breakdown of skin: Secondary | ICD-10-CM

## 2023-04-21 DIAGNOSIS — M10071 Idiopathic gout, right ankle and foot: Secondary | ICD-10-CM

## 2023-04-21 DIAGNOSIS — L97511 Non-pressure chronic ulcer of other part of right foot limited to breakdown of skin: Secondary | ICD-10-CM

## 2023-04-21 DIAGNOSIS — M79672 Pain in left foot: Secondary | ICD-10-CM

## 2023-04-21 MED ORDER — COLCHICINE 0.6 MG PO TABS: 0.6000 mg | ORAL_TABLET | Freq: Every day | ORAL | 0 refills | Status: DC

## 2023-04-29 NOTE — Progress Notes (Signed)
Chief Complaint  Patient presents with   Foot Pain    "Urgent Care said I have Cellulitis." N - Cellutitis L - dorsal right D - 1 week O - suddenly C - tender, swelling, pre-diabetic A - walking, standing, pressure, shoes T - Urgent Care - Cephalexin    Subjective:  57 y.o. female with PMHx of diabetes mellitus, last A1c 5.6 on 11/04/2022, presenting as an urgent work in for evaluation of cellulitis to the dorsum of the right foot.  Onset about 1 week ago.  She went to the urgent care and was prescribed cephalexin.  She was last seen in our office 03/09/2019.  She does have history of ORIF right ankle fracture.   Past Medical History:  Diagnosis Date   Acid reflux    takes Tums   Anemia    Arthritis    Bipolar 1 disorder (HCC)    Blindness of left eye    left eye removed   Carpal tunnel syndrome, bilateral    Cervical radiculopathy    CHF (congestive heart failure) (HCC)    Chronic kidney disease    Stage 5- 01/25/17   Constipation    COPD (chronic obstructive pulmonary disease) (HCC) 2014   bronchitis   Degenerative disc disease, thoracic    Depression    Diabetes mellitus    Type II   Diabetic retinopathy (HCC)    Dyspnea    when walking   End stage kidney disease (HCC)    M, W, F [facility] [place]   Gout    Hypertension    Non-alcoholic cirrhosis (HCC)    Noncompliance with medication regimen    Noncompliance with medication regimen    Obesity (BMI 30-39.9)    OSA (obstructive sleep apnea)    cpap   Panic attack    RLS (restless legs syndrome)     Past Surgical History:  Procedure Laterality Date   AORTIC ARCH ANGIOGRAPHY N/A 09/11/2020   Procedure: AORTIC ARCH ANGIOGRAPHY;  Surgeon: Nada Libman, MD;  [facility];  Service: Cardiovascular;  Laterality: N/A;   AV FISTULA PLACEMENT Right 01/27/2017   Procedure: ARTERIOVENOUS (AV) FISTULA CREATION-RIGHT ARM;  Surgeon: Sherren Kerns, MD;  Location: [facility] OR;  Service: Vascular;   Laterality: Right;   AV FISTULA PLACEMENT Left 08/03/2018   Procedure: LEFT ARTERIOVENOUS (AV) FISTULA CREATION;  Surgeon: Sherren Kerns, MD;  Location: [facility] OR;  Service: Vascular;  Laterality: Left;   BASCILIC VEIN TRANSPOSITION Left 04/26/2019   Procedure: SECOND STAGE BASILIC VEIN TRANSPOSITION LEFT ARM;  Surgeon: Sherren Kerns, MD;  Location: [facility] OR;  Service: Vascular;  Laterality: Left;   BIOPSY N/A 05/17/2013   Procedure: BIOPSY;  Surgeon: West Bali, MD;  Location: [facility];  Service: Endoscopy;  Laterality: N/A;   CARPAL TUNNEL RELEASE Left 11/25/2018   Procedure: LEFT CARPAL TUNNEL RELEASE;  Surgeon: Dairl Ponder, MD;  Location: [facility] OR;  Service: Orthopedics;  Laterality: Left;   CATARACT EXTRACTION Right    CHOLECYSTECTOMY N/A 04/27/2014   Procedure: LAPAROSCOPIC CHOLECYSTECTOMY;  Surgeon: Marlane Hatcher, MD;  Location: [facility];  Service: General;  Laterality: N/A;   COLONOSCOPY  06/06/2010   VWU:[id number] bleeding secondary to internal hemorrhoids but incomplete evaluation secondary to poor right colon prep/small rectal and sigmoid colon polyps (hyperplastic). PROPOFOL   COLONOSCOPY  May 2013   Dr. Gilliam/[facility]: 5 mm a descending colon polyp, hyperplastic. Adequate bowel prep.   ENDOMETRIAL ABLATION     ENUCLEATION Left  02/04/2018   ENUCLEATION WITH PLACEMENT OF IMPLANT LEFT EYE   ENUCLEATION Left 02/04/2018   Procedure: ENUCLEATION WITH PLACEMENT OF IMPLANT LEFT EYE;  Surgeon: Floydene Flock, MD;  Location: [facility];  Service: Ophthalmology;  Laterality: Left;   ESOPHAGOGASTRODUODENOSCOPY  06/06/2010   ZOX:WRUEAVW erythema and edema of body of stomach, with sessile polypoid lesions. bx benign. no h.pylori   ESOPHAGOGASTRODUODENOSCOPY (EGD) WITH PROPOFOL N/A 05/17/2013   Dr. Darrick Penna: normal esophagus, moderate nodular gastritis, negative path, empiric Savary dilation   EYE SURGERY  07/2017   sx for glaucoma   FLEXIBLE SIGMOIDOSCOPY N/A 05/17/2013   Dr. Darrick Penna: moderate  sized internal hemorrhoids   HEMORRHOID BANDING N/A 05/17/2013   Procedure: HEMORRHOID BANDING;  Surgeon: West Bali, MD;  Location: [facility];  Service: Endoscopy;  Laterality: N/A;  2 bands placed   INCISION AND DRAINAGE ABSCESS Left 10/11/2013   Procedure: INCISION AND DRAINAGE AND DEBRIDEMENT LEFT BREAST  ABSCESS;  Surgeon: Marlane Hatcher, MD;  Location: [facility];  Service: General;  Laterality: Left;   INSERTION OF DIALYSIS CATHETER N/A 06/08/2018   Procedure: INSERTION OF Right internal jugular TUNNELED  DIALYSIS CATHETER;  Surgeon: Chuck Hint, MD;  Location: [facility] OR;  Service: Vascular;  Laterality: N/A;   IRRIGATION AND DEBRIDEMENT ABSCESS Right 06/01/2013   Procedure: INCISION AND DRAINAGE AND DEBRIDEMENT ABSCESS RIGHT BREAST;  Surgeon: Marlane Hatcher, MD;  Location: [facility];  Service: General;  Laterality: Right;   LEFT EYE REMOVED Left 01/2018   [facility] [address].   LIGATION OF ARTERIOVENOUS  FISTULA Right 06/08/2018   Procedure: LIGATION OF ARTERIOVENOUS  FISTULA RIGHT ARM;  Surgeon: Chuck Hint, MD;  Location: [facility] OR;  Service: Vascular;  Laterality: Right;   ORIF ANKLE FRACTURE Right 12/17/2018   Procedure: OPEN REDUCTION INTERNAL FIXATION (ORIF) ANKLE FRACTURE;  Surgeon: Felecia Shelling, DPM;  Location: [facility];  Service: Podiatry;  Laterality: Right;   REVISON OF ARTERIOVENOUS FISTULA Left 09/26/2020   Procedure: LEFT ARTERIOVENOUS FISTULA  DISTAL REVASCULARIZATION AND LIGATION ( DRIL ) USING GREATER SAPHENOUS VEIN;  Surgeon: Maeola Harman, MD;  Location: [facility] OR;  Service: Vascular;  Laterality: Left;   SAVORY DILATION N/A 05/17/2013   Procedure: SAVORY DILATION;  Surgeon: West Bali, MD;  Location: [facility];  Service: Endoscopy;  Laterality: N/A;  14/15/16   SKIN FULL THICKNESS GRAFT Left 05/05/2018   Procedure: ABDOMINAL DERMIS FAT SKIN GRAFT FULL THICKNESS LEFT EYE;  Surgeon: Floydene Flock, MD;  [facility];  Service:  Ophthalmology;  Laterality: Left;   TUBAL LIGATION     VEIN HARVEST Left 09/26/2020   Procedure: LEFT LEG GREATER SAPHENOUS VEIN HARVEST;  Surgeon: Maeola Harman, MD;  Location: [facility] OR;  Service: Vascular;  Laterality: Left;    Allergies  Allergen Reactions   Codeine Nausea And Vomiting   Tape Rash and Other (See Comments)    Pull skin off.  Paper tape is ok     RT foot 04/21/2023 Objective/Physical Exam General: The patient is alert and oriented x3 in no acute distress.  Dermatology:  No open wound.  No open lacerations.  No drainage.  Skin is warm, dry and supple bilateral lower extremities.  Vascular: Palpable pedal pulses bilaterally. No edema or erythema noted. Capillary refill within normal limits.  Neurological: Light touch and protective threshold diminished bilaterally.   Musculoskeletal Exam: Patient is minimally ambulatory.  Muscle strength 5/5 all compartments.  No crepitus with palpation.  Assessment: 1.  Cellulitis right foot 2. diabetes mellitus w/ peripheral neuropathy   Plan of Care:  -Patient was evaluated. -After evaluating the patient I do believe that she may be suffering from an acute gout episode.  I am not convinced that this is truly a cellulitis. -However in the meantime continue oral antibiotics as prescribed -Prescription for colchicine 0.6 mg daily -RICE -Return to clinic 3 weeks   Felecia Shelling, DPM [facility]  Dr. Felecia Shelling, DPM    [address] [place], [place] [address]                Office [phone]  Fax [phone]    Is

## 2023-05-12 ENCOUNTER — Encounter: Payer: Self-pay | Admitting: Podiatry

## 2023-05-12 ENCOUNTER — Ambulatory Visit (INDEPENDENT_AMBULATORY_CARE_PROVIDER_SITE_OTHER): Payer: Medicaid Other | Admitting: Podiatry

## 2023-05-12 DIAGNOSIS — M10071 Idiopathic gout, right ankle and foot: Secondary | ICD-10-CM | POA: Diagnosis not present

## 2023-05-18 NOTE — Progress Notes (Signed)
Chief Complaint  Patient presents with   Gout    "It's better."   Diabetes    "I need my toenails clipped."    Subjective:  57 y.o. female with PMHx of diabetes mellitus, last A1c 5.6 on 11/04/2022, presenting for follow-up evaluation of erythema and edema to the right forefoot.  Patient states that she is doing significantly better.  She has no pain or tenderness and the redness and swelling has mostly resolved.   Past Medical History:  Diagnosis Date   Acid reflux    takes Tums   Anemia    Arthritis    Bipolar 1 disorder (HCC)    Blindness of left eye    left eye removed   Carpal tunnel syndrome, bilateral    Cervical radiculopathy    CHF (congestive heart failure) (HCC)    Chronic kidney disease    Stage 5- 01/25/17   Constipation    COPD (chronic obstructive pulmonary disease) (HCC) 2014   bronchitis   Degenerative disc disease, thoracic    Depression    Diabetes mellitus    Type II   Diabetic retinopathy (HCC)    Dyspnea    when walking   End stage kidney disease (HCC)    M, W, F [facility] [place]   Gout    Hypertension    Non-alcoholic cirrhosis (HCC)    Noncompliance with medication regimen    Noncompliance with medication regimen    Obesity (BMI 30-39.9)    OSA (obstructive sleep apnea)    cpap   Panic attack    RLS (restless legs syndrome)     Past Surgical History:  Procedure Laterality Date   AORTIC ARCH ANGIOGRAPHY N/A 09/11/2020   Procedure: AORTIC ARCH ANGIOGRAPHY;  Surgeon: Nada Libman, MD;  [facility];  Service: Cardiovascular;  Laterality: N/A;   AV FISTULA PLACEMENT Right 01/27/2017   Procedure: ARTERIOVENOUS (AV) FISTULA CREATION-RIGHT ARM;  Surgeon: Sherren Kerns, MD;  Location: [facility] OR;  Service: Vascular;  Laterality: Right;   AV FISTULA PLACEMENT Left 08/03/2018   Procedure: LEFT ARTERIOVENOUS (AV) FISTULA CREATION;  Surgeon: Sherren Kerns, MD;  Location: [facility] OR;  Service: Vascular;  Laterality: Left;   BASCILIC  VEIN TRANSPOSITION Left 04/26/2019   Procedure: SECOND STAGE BASILIC VEIN TRANSPOSITION LEFT ARM;  Surgeon: Sherren Kerns, MD;  Location: [facility] OR;  Service: Vascular;  Laterality: Left;   BIOPSY N/A 05/17/2013   Procedure: BIOPSY;  Surgeon: West Bali, MD;  Location[facility];  Service: Endoscopy;  Laterality: N/A;   CARPAL TUNNEL RELEASE Left 11/25/2018   Procedure: LEFT CARPAL TUNNEL RELEASE;  Surgeon: Dairl Ponder, MD;  Location: [facility] OR;  Service: Orthopedics;  Laterality: Left;   CATARACT EXTRACTION Right    CHOLECYSTECTOMY N/A 04/27/2014   Procedure: LAPAROSCOPIC CHOLECYSTECTOMY;  Surgeon: Marlane Hatcher, MD;  Location: [facility];  Service: General;  Laterality: N/A;   COLONOSCOPY  06/06/2010   IRJ:JOACZY bleeding secondary to internal hemorrhoids but incomplete evaluation secondary to poor right colon prep/small rectal and sigmoid colon polyps (hyperplastic). PROPOFOL   COLONOSCOPY  May 2013   Dr. Gilliam/[facility]: 5 mm a descending colon polyp, hyperplastic. Adequate bowel prep.   ENDOMETRIAL ABLATION     ENUCLEATION Left 02/04/2018   ENUCLEATION WITH PLACEMENT OF IMPLANT LEFT EYE   ENUCLEATION Left 02/04/2018   Procedure: ENUCLEATION WITH PLACEMENT OF IMPLANT LEFT EYE;  Surgeon: Floydene Flock, MD;  [facility];  Service: Ophthalmology;  Laterality: Left;  ESOPHAGOGASTRODUODENOSCOPY  06/06/2010   ZOX:WRUEAVW erythema and edema of body of stomach, with sessile polypoid lesions. bx benign. no h.pylori   ESOPHAGOGASTRODUODENOSCOPY (EGD) WITH PROPOFOL N/A 05/17/2013   Dr. Darrick Penna: normal esophagus, moderate nodular gastritis, negative path, empiric Savary dilation   EYE SURGERY  07/2017   sx for glaucoma   FLEXIBLE SIGMOIDOSCOPY N/A 05/17/2013   Dr. Darrick Penna: moderate sized internal hemorrhoids   HEMORRHOID BANDING N/A 05/17/2013   Procedure: HEMORRHOID BANDING;  Surgeon: West Bali, MD;  Location: [facility];  Service: Endoscopy;  Laterality: N/A;  2 bands placed   INCISION  AND DRAINAGE ABSCESS Left 10/11/2013   Procedure: INCISION AND DRAINAGE AND DEBRIDEMENT LEFT BREAST  ABSCESS;  Surgeon: Marlane Hatcher, MD;  Location: [facility];  Service: General;  Laterality: Left;   INSERTION OF DIALYSIS CATHETER N/A 06/08/2018   Procedure: INSERTION OF Right internal jugular TUNNELED  DIALYSIS CATHETER;  Surgeon: Chuck Hint, MD;  Location: [facility] OR;  Service: Vascular;  Laterality: N/A;   IRRIGATION AND DEBRIDEMENT ABSCESS Right 06/01/2013   Procedure: INCISION AND DRAINAGE AND DEBRIDEMENT ABSCESS RIGHT BREAST;  Surgeon: Marlane Hatcher, MD;  Location: [facility];  Service: General;  Laterality: Right;   LEFT EYE REMOVED Left 01/2018   [facility] [address].   LIGATION OF ARTERIOVENOUS  FISTULA Right 06/08/2018   Procedure: LIGATION OF ARTERIOVENOUS  FISTULA RIGHT ARM;  Surgeon: Chuck Hint, MD;  Location: [facility] OR;  Service: Vascular;  Laterality: Right;   ORIF ANKLE FRACTURE Right 12/17/2018   Procedure: OPEN REDUCTION INTERNAL FIXATION (ORIF) ANKLE FRACTURE;  Surgeon: Felecia Shelling, DPM;  Location: [facility];  Service: Podiatry;  Laterality: Right;   REVISON OF ARTERIOVENOUS FISTULA Left 09/26/2020   Procedure: LEFT ARTERIOVENOUS FISTULA  DISTAL REVASCULARIZATION AND LIGATION ( DRIL ) USING GREATER SAPHENOUS VEIN;  Surgeon: Maeola Harman, MD;  Location: [facility] OR;  Service: Vascular;  Laterality: Left;   SAVORY DILATION N/A 05/17/2013   Procedure: SAVORY DILATION;  Surgeon: West Bali, MD;  Location: [facility];  Service: Endoscopy;  Laterality: N/A;  14/15/16   SKIN FULL THICKNESS GRAFT Left 05/05/2018   Procedure: ABDOMINAL DERMIS FAT SKIN GRAFT FULL THICKNESS LEFT EYE;  Surgeon: Floydene Flock, MD;  Location: [facility];  Service: Ophthalmology;  Laterality: Left;   TUBAL LIGATION     VEIN HARVEST Left 09/26/2020   Procedure: LEFT LEG GREATER SAPHENOUS VEIN HARVEST;  Surgeon: Maeola Harman, MD;  Location: [facility] OR;  Service:  Vascular;  Laterality: Left;    Allergies  Allergen Reactions   Codeine Nausea And Vomiting   Tape Rash and Other (See Comments)    Pull skin off.  Paper tape is ok     RT foot 04/21/2023  Objective/Physical Exam General: The patient is alert and oriented x3 in no acute distress.  Dermatology:  No open wound.  No open lacerations.  No drainage.  Skin is warm, dry and supple bilateral lower extremities.  Vascular: Palpable pedal pulses bilaterally. No edema or erythema noted. Capillary refill within normal limits.  Neurological: Light touch and protective threshold diminished bilaterally.   Musculoskeletal Exam: Patient is minimally ambulatory.  Muscle strength 5/5 all compartments.  No crepitus with palpation.  Assessment: 1.  Cellulitis right foot vs. Acute gout RT foot 2. diabetes mellitus w/ peripheral neuropathy   Plan of Care:  -Patient was evaluated. - Overall the patient is doing much better.  She has no pain or tenderness associated to the  right foot.  The erythema and edema around the forefoot has resolved -Patient may resume full activity no restrictions.  Patient mostly sedentary and wheelchair-bound with the exception of transfers and short distances -Return to clinic as needed   Felecia Shelling, DPM [facility]  Dr. Felecia Shelling, DPM    [address] [place], [place] [address]                Office [phone]  Fax [phone]    Is

## 2023-06-04 ENCOUNTER — Other Ambulatory Visit: Payer: Self-pay | Admitting: Podiatry

## 2023-06-11 ENCOUNTER — Encounter (HOSPITAL_COMMUNITY): Payer: Self-pay | Admitting: Physical Therapy

## 2023-06-11 NOTE — Therapy (Unsigned)
PHYSICAL THERAPY DISCHARGE SUMMARY  Visits from Start of Care: 10  Current functional level related to goals / functional outcomes: Wound is smaller with improved granualtion   Remaining deficits: Not healed   Education / Equipment: Wound care   Patient agrees to discharge. Patient goals were partially met. Patient is being discharged due to not returning since the last visit. Virgina Organ, PT CLT [phone]

## 2023-08-28 ENCOUNTER — Emergency Department (HOSPITAL_COMMUNITY): Payer: Medicaid Other

## 2023-08-28 ENCOUNTER — Encounter (HOSPITAL_COMMUNITY): Payer: Self-pay | Admitting: Emergency Medicine

## 2023-08-28 ENCOUNTER — Other Ambulatory Visit: Payer: Self-pay

## 2023-08-28 ENCOUNTER — Inpatient Hospital Stay (HOSPITAL_COMMUNITY)
Admission: EM | Admit: 2023-08-28 | Discharge: 2023-08-30 | DRG: 871 | Disposition: A | Discharge status: Home / Self Care | Payer: Medicaid Other | Attending: Family Medicine | Admitting: Family Medicine

## 2023-08-28 DIAGNOSIS — Z7951 Long term (current) use of inhaled steroids: Secondary | ICD-10-CM

## 2023-08-28 DIAGNOSIS — N186 End stage renal disease: Secondary | ICD-10-CM | POA: Diagnosis present

## 2023-08-28 DIAGNOSIS — G2581 Restless legs syndrome: Secondary | ICD-10-CM | POA: Diagnosis present

## 2023-08-28 DIAGNOSIS — K7460 Unspecified cirrhosis of liver: Secondary | ICD-10-CM | POA: Diagnosis present

## 2023-08-28 DIAGNOSIS — I509 Heart failure, unspecified: Secondary | ICD-10-CM | POA: Diagnosis present

## 2023-08-28 DIAGNOSIS — Z885 Allergy status to narcotic agent status: Secondary | ICD-10-CM

## 2023-08-28 DIAGNOSIS — H5462 Unqualified visual loss, left eye, normal vision right eye: Secondary | ICD-10-CM | POA: Diagnosis present

## 2023-08-28 DIAGNOSIS — G8194 Hemiplegia, unspecified affecting left nondominant side: Secondary | ICD-10-CM | POA: Diagnosis present

## 2023-08-28 DIAGNOSIS — D61818 Other pancytopenia: Secondary | ICD-10-CM | POA: Diagnosis present

## 2023-08-28 DIAGNOSIS — D631 Anemia in chronic kidney disease: Secondary | ICD-10-CM | POA: Diagnosis present

## 2023-08-28 DIAGNOSIS — I132 Hypertensive heart and chronic kidney disease with heart failure and with stage 5 chronic kidney disease, or end stage renal disease: Secondary | ICD-10-CM | POA: Diagnosis present

## 2023-08-28 DIAGNOSIS — Z66 Do not resuscitate: Secondary | ICD-10-CM | POA: Diagnosis present

## 2023-08-28 DIAGNOSIS — Z9001 Acquired absence of eye: Secondary | ICD-10-CM

## 2023-08-28 DIAGNOSIS — Z794 Long term (current) use of insulin: Secondary | ICD-10-CM | POA: Diagnosis not present

## 2023-08-28 DIAGNOSIS — E785 Hyperlipidemia, unspecified: Secondary | ICD-10-CM | POA: Diagnosis present

## 2023-08-28 DIAGNOSIS — K529 Noninfective gastroenteritis and colitis, unspecified: Secondary | ICD-10-CM | POA: Diagnosis present

## 2023-08-28 DIAGNOSIS — E11319 Type 2 diabetes mellitus with unspecified diabetic retinopathy without macular edema: Secondary | ICD-10-CM | POA: Diagnosis present

## 2023-08-28 DIAGNOSIS — Z91148 Patient's other noncompliance with medication regimen for other reason: Secondary | ICD-10-CM

## 2023-08-28 DIAGNOSIS — F319 Bipolar disorder, unspecified: Secondary | ICD-10-CM | POA: Diagnosis present

## 2023-08-28 DIAGNOSIS — Z91158 Patient's noncompliance with renal dialysis for other reason: Secondary | ICD-10-CM

## 2023-08-28 DIAGNOSIS — J440 Chronic obstructive pulmonary disease with acute lower respiratory infection: Secondary | ICD-10-CM | POA: Diagnosis present

## 2023-08-28 DIAGNOSIS — K5900 Constipation, unspecified: Secondary | ICD-10-CM | POA: Diagnosis present

## 2023-08-28 DIAGNOSIS — R0602 Shortness of breath: Secondary | ICD-10-CM

## 2023-08-28 DIAGNOSIS — M159 Polyosteoarthritis, unspecified: Secondary | ICD-10-CM | POA: Diagnosis present

## 2023-08-28 DIAGNOSIS — C3490 Malignant neoplasm of unspecified part of unspecified bronchus or lung: Secondary | ICD-10-CM | POA: Diagnosis present

## 2023-08-28 DIAGNOSIS — Z992 Dependence on renal dialysis: Secondary | ICD-10-CM

## 2023-08-28 DIAGNOSIS — E1122 Type 2 diabetes mellitus with diabetic chronic kidney disease: Secondary | ICD-10-CM | POA: Diagnosis present

## 2023-08-28 DIAGNOSIS — K219 Gastro-esophageal reflux disease without esophagitis: Secondary | ICD-10-CM | POA: Diagnosis present

## 2023-08-28 DIAGNOSIS — Z1152 Encounter for screening for COVID-19: Secondary | ICD-10-CM

## 2023-08-28 DIAGNOSIS — I69354 Hemiplegia and hemiparesis following cerebral infarction affecting left non-dominant side: Secondary | ICD-10-CM | POA: Diagnosis not present

## 2023-08-28 DIAGNOSIS — Z79899 Other long term (current) drug therapy: Secondary | ICD-10-CM

## 2023-08-28 DIAGNOSIS — Z87891 Personal history of nicotine dependence: Secondary | ICD-10-CM

## 2023-08-28 DIAGNOSIS — B338 Other specified viral diseases: Principal | ICD-10-CM

## 2023-08-28 DIAGNOSIS — A09 Infectious gastroenteritis and colitis, unspecified: Secondary | ICD-10-CM | POA: Diagnosis present

## 2023-08-28 DIAGNOSIS — Z6841 Body Mass Index (BMI) 40.0 and over, adult: Secondary | ICD-10-CM | POA: Diagnosis not present

## 2023-08-28 DIAGNOSIS — N049 Nephrotic syndrome with unspecified morphologic changes: Secondary | ICD-10-CM

## 2023-08-28 DIAGNOSIS — J9601 Acute respiratory failure with hypoxia: Secondary | ICD-10-CM | POA: Diagnosis present

## 2023-08-28 DIAGNOSIS — Z9851 Tubal ligation status: Secondary | ICD-10-CM

## 2023-08-28 DIAGNOSIS — I10 Essential (primary) hypertension: Secondary | ICD-10-CM | POA: Diagnosis present

## 2023-08-28 DIAGNOSIS — J210 Acute bronchiolitis due to respiratory syncytial virus: Secondary | ICD-10-CM | POA: Diagnosis present

## 2023-08-28 DIAGNOSIS — J1289 Other viral pneumonia: Secondary | ICD-10-CM | POA: Diagnosis present

## 2023-08-28 DIAGNOSIS — A419 Sepsis, unspecified organism: Secondary | ICD-10-CM | POA: Diagnosis present

## 2023-08-28 DIAGNOSIS — F410 Panic disorder [episodic paroxysmal anxiety] without agoraphobia: Secondary | ICD-10-CM | POA: Diagnosis present

## 2023-08-28 DIAGNOSIS — Z860100 Personal history of colon polyps, unspecified: Secondary | ICD-10-CM

## 2023-08-28 DIAGNOSIS — E119 Type 2 diabetes mellitus without complications: Secondary | ICD-10-CM

## 2023-08-28 DIAGNOSIS — G4733 Obstructive sleep apnea (adult) (pediatric): Secondary | ICD-10-CM | POA: Diagnosis present

## 2023-08-28 DIAGNOSIS — Z91048 Other nonmedicinal substance allergy status: Secondary | ICD-10-CM

## 2023-08-28 DIAGNOSIS — E66813 Obesity, class 3: Secondary | ICD-10-CM | POA: Diagnosis present

## 2023-08-28 DIAGNOSIS — M109 Gout, unspecified: Secondary | ICD-10-CM | POA: Diagnosis present

## 2023-08-28 LAB — CBC WITH DIFFERENTIAL/PLATELET
Abs Immature Granulocytes: 0.02 10*3/uL (ref 0.00–0.07)
Basophils Absolute: 0 10*3/uL (ref 0.0–0.1)
Basophils Relative: 0 %
Eosinophils Absolute: 0 10*3/uL (ref 0.0–0.5)
Eosinophils Relative: 1 %
HCT: 30.7 % — ABNORMAL LOW (ref 36.0–46.0)
Hemoglobin: 9.4 g/dL — ABNORMAL LOW (ref 12.0–15.0)
Immature Granulocytes: 1 %
Lymphocytes Relative: 15 %
Lymphs Abs: 0.5 10*3/uL — ABNORMAL LOW (ref 0.7–4.0)
MCH: 27.9 pg (ref 26.0–34.0)
MCHC: 30.6 g/dL (ref 30.0–36.0)
MCV: 91.1 fL (ref 80.0–100.0)
Monocytes Absolute: 0.2 10*3/uL (ref 0.1–1.0)
Monocytes Relative: 7 %
Neutro Abs: 2.4 10*3/uL (ref 1.7–7.7)
Neutrophils Relative %: 76 %
Platelets: 86 10*3/uL — ABNORMAL LOW (ref 150–400)
RBC: 3.37 MIL/uL — ABNORMAL LOW (ref 3.87–5.11)
RDW: 19.2 % — ABNORMAL HIGH (ref 11.5–15.5)
Smear Review: DECREASED
WBC: 3.1 10*3/uL — ABNORMAL LOW (ref 4.0–10.5)
nRBC: 0 % (ref 0.0–0.2)

## 2023-08-28 LAB — BASIC METABOLIC PANEL
Anion gap: 16 — ABNORMAL HIGH (ref 5–15)
BUN: 38 mg/dL — ABNORMAL HIGH (ref 6–20)
CO2: 24 mmol/L (ref 22–32)
Calcium: 8.9 mg/dL (ref 8.9–10.3)
Chloride: 99 mmol/L (ref 98–111)
Creatinine, Ser: 11.51 mg/dL — ABNORMAL HIGH (ref 0.44–1.00)
GFR, Estimated: 4 mL/min — ABNORMAL LOW (ref >60)
Glucose, Bld: 143 mg/dL — ABNORMAL HIGH (ref 70–99)
Potassium: 4.9 mmol/L (ref 3.5–5.1)
Sodium: 139 mmol/L (ref 135–145)

## 2023-08-28 LAB — MRSA NEXT GEN BY PCR, NASAL: MRSA by PCR Next Gen: DETECTED — AB

## 2023-08-28 LAB — LACTIC ACID, PLASMA: Lactic Acid, Venous: 1.3 mmol/L (ref 0.5–1.9)

## 2023-08-28 LAB — BRAIN NATRIURETIC PEPTIDE: B Natriuretic Peptide: 1369 pg/mL — ABNORMAL HIGH (ref 0.0–100.0)

## 2023-08-28 LAB — RESP PANEL BY RT-PCR (RSV, FLU A&B, COVID)  RVPGX2
Influenza A by PCR: NEGATIVE
Influenza B by PCR: NEGATIVE
Resp Syncytial Virus by PCR: POSITIVE — AB
SARS Coronavirus 2 by RT PCR: NEGATIVE

## 2023-08-28 LAB — GLUCOSE, CAPILLARY: Glucose-Capillary: 182 mg/dL — ABNORMAL HIGH (ref 70–99)

## 2023-08-28 LAB — PROCALCITONIN: Procalcitonin: 0.54 ng/mL

## 2023-08-28 LAB — PHOSPHORUS: Phosphorus: 6.4 mg/dL — ABNORMAL HIGH (ref 2.5–4.6)

## 2023-08-28 LAB — HEPATITIS B SURFACE ANTIGEN: Hepatitis B Surface Ag: NONREACTIVE

## 2023-08-28 LAB — MAGNESIUM: Magnesium: 1.9 mg/dL (ref 1.7–2.4)

## 2023-08-28 MED ORDER — ALPRAZOLAM 0.5 MG PO TABS
1.0000 mg | ORAL_TABLET | Freq: Once | ORAL | Status: AC
  Administered 2023-08-28: 1 mg via ORAL
  Filled 2023-08-28: qty 2

## 2023-08-28 MED ORDER — HEPARIN SODIUM (PORCINE) 5000 UNIT/ML IJ SOLN
5000.0000 [IU] | Freq: Three times a day (TID) | INTRAMUSCULAR | Status: DC
  Administered 2023-08-28 – 2023-08-29 (×2): 5000 [IU] via SUBCUTANEOUS
  Filled 2023-08-28 (×2): qty 1

## 2023-08-28 MED ORDER — PAROXETINE HCL 20 MG PO TABS
20.0000 mg | ORAL_TABLET | Freq: Every day | ORAL | Status: DC
  Administered 2023-08-28 – 2023-08-29 (×2): 20 mg via ORAL
  Filled 2023-08-28 (×2): qty 1

## 2023-08-28 MED ORDER — ACETAMINOPHEN 650 MG RE SUPP: 650.0000 mg | Freq: Four times a day (QID) | RECTAL | Status: DC | PRN

## 2023-08-28 MED ORDER — LATANOPROST 0.005 % OP SOLN
1.0000 [drp] | Freq: Two times a day (BID) | OPHTHALMIC | Status: DC
  Administered 2023-08-28 – 2023-08-29 (×2): 1 [drp] via OPHTHALMIC
  Filled 2023-08-28: qty 2.5

## 2023-08-28 MED ORDER — FLEET ENEMA RE ENEM
1.0000 | ENEMA | Freq: Once | RECTAL | Status: DC | PRN
Start: 2023-08-28 — End: 2023-08-29

## 2023-08-28 MED ORDER — BUSPIRONE HCL 5 MG PO TABS
7.5000 mg | ORAL_TABLET | Freq: Two times a day (BID) | ORAL | Status: DC
Start: 2023-08-28 — End: 2023-08-30
  Administered 2023-08-28 – 2023-08-30 (×4): 7.5 mg via ORAL
  Filled 2023-08-28 (×4): qty 2

## 2023-08-28 MED ORDER — DIPHENOXYLATE-ATROPINE 2.5-0.025 MG PO TABS
1.0000 | ORAL_TABLET | Freq: Four times a day (QID) | ORAL | Status: DC
  Administered 2023-08-28 – 2023-08-30 (×7): 1 via ORAL
  Filled 2023-08-28 (×8): qty 1

## 2023-08-28 MED ORDER — IPRATROPIUM BROMIDE 0.02 % IN SOLN
0.5000 mg | Freq: Four times a day (QID) | RESPIRATORY_TRACT | Status: DC | PRN
  Filled 2023-08-28: qty 2.5

## 2023-08-28 MED ORDER — CALCIUM CARBONATE ANTACID 500 MG PO CHEW
1.0000 | CHEWABLE_TABLET | Freq: Three times a day (TID) | ORAL | Status: DC
  Administered 2023-08-29 – 2023-08-30 (×3): 200 mg via ORAL
  Filled 2023-08-28 (×3): qty 1

## 2023-08-28 MED ORDER — HYDRALAZINE HCL 20 MG/ML IJ SOLN
10.0000 mg | INTRAMUSCULAR | Status: DC | PRN
  Administered 2023-08-28 – 2023-08-29 (×2): 10 mg via INTRAVENOUS
  Filled 2023-08-28 (×2): qty 1

## 2023-08-28 MED ORDER — METHYLPREDNISOLONE SODIUM SUCC 125 MG IJ SOLR
80.0000 mg | Freq: Two times a day (BID) | INTRAMUSCULAR | Status: DC
Start: 2023-08-28 — End: 2023-08-29
  Administered 2023-08-28 – 2023-08-29 (×2): 80 mg via INTRAVENOUS
  Filled 2023-08-28 (×2): qty 2

## 2023-08-28 MED ORDER — ONDANSETRON HCL 4 MG PO TABS: 4.0000 mg | ORAL_TABLET | Freq: Four times a day (QID) | ORAL | Status: DC | PRN

## 2023-08-28 MED ORDER — TRAZODONE HCL 50 MG PO TABS
25.0000 mg | ORAL_TABLET | Freq: Every evening | ORAL | Status: DC | PRN
  Administered 2023-08-28: 25 mg via ORAL
  Filled 2023-08-28: qty 1

## 2023-08-28 MED ORDER — HYDROMORPHONE HCL 1 MG/ML IJ SOLN: 0.5000 mg | INTRAMUSCULAR | Status: DC | PRN

## 2023-08-28 MED ORDER — LIDOCAINE-PRILOCAINE 2.5-2.5 % EX CREA: 1.0000 | TOPICAL_CREAM | Freq: Every day | CUTANEOUS | Status: DC | PRN

## 2023-08-28 MED ORDER — ACETAMINOPHEN 325 MG PO TABS: 650.0000 mg | ORAL_TABLET | Freq: Four times a day (QID) | ORAL | Status: DC | PRN

## 2023-08-28 MED ORDER — BISACODYL 5 MG PO TBEC: 5.0000 mg | DELAYED_RELEASE_TABLET | Freq: Every day | ORAL | Status: DC | PRN

## 2023-08-28 MED ORDER — MUPIROCIN 2 % EX OINT
1.0000 | TOPICAL_OINTMENT | Freq: Two times a day (BID) | CUTANEOUS | Status: DC
  Administered 2023-08-28 – 2023-08-30 (×3): 1 via NASAL
  Filled 2023-08-28: qty 22

## 2023-08-28 MED ORDER — CINACALCET HCL 30 MG PO TABS
60.0000 mg | ORAL_TABLET | Freq: Every day | ORAL | Status: DC
  Administered 2023-08-29 – 2023-08-30 (×2): 60 mg via ORAL
  Filled 2023-08-28 (×2): qty 2

## 2023-08-28 MED ORDER — GUAIFENESIN-DM 100-10 MG/5ML PO SYRP
10.0000 mL | ORAL_SOLUTION | Freq: Three times a day (TID) | ORAL | Status: DC
  Administered 2023-08-28 – 2023-08-30 (×6): 10 mL via ORAL
  Filled 2023-08-28 (×6): qty 10

## 2023-08-28 MED ORDER — SENNOSIDES-DOCUSATE SODIUM 8.6-50 MG PO TABS: 1.0000 | ORAL_TABLET | Freq: Every evening | ORAL | Status: DC | PRN

## 2023-08-28 MED ORDER — SODIUM CHLORIDE 0.9% FLUSH
3.0000 mL | Freq: Two times a day (BID) | INTRAVENOUS | Status: DC
  Administered 2023-08-28 – 2023-08-30 (×3): 3 mL via INTRAVENOUS

## 2023-08-28 MED ORDER — ONDANSETRON HCL 4 MG/2ML IJ SOLN
4.0000 mg | Freq: Once | INTRAMUSCULAR | Status: AC
  Administered 2023-08-28: 4 mg via INTRAVENOUS
  Filled 2023-08-28: qty 2

## 2023-08-28 MED ORDER — CHLORHEXIDINE GLUCONATE CLOTH 2 % EX PADS
6.0000 | MEDICATED_PAD | Freq: Every day | CUTANEOUS | Status: DC
  Administered 2023-08-29 – 2023-08-30 (×2): 6 via TOPICAL

## 2023-08-28 MED ORDER — IPRATROPIUM-ALBUTEROL 0.5-2.5 (3) MG/3ML IN SOLN
3.0000 mL | Freq: Once | RESPIRATORY_TRACT | Status: AC
  Administered 2023-08-28: 3 mL via RESPIRATORY_TRACT
  Filled 2023-08-28: qty 3

## 2023-08-28 MED ORDER — DOXERCALCIFEROL 4 MCG/2ML IV SOLN: 1.0000 ug | INTRAVENOUS | Status: DC

## 2023-08-28 MED ORDER — INSULIN GLARGINE-YFGN 100 UNIT/ML ~~LOC~~ SOLN
30.0000 [IU] | Freq: Every day | SUBCUTANEOUS | Status: DC
  Administered 2023-08-29: 30 [IU] via SUBCUTANEOUS
  Filled 2023-08-28 (×2): qty 0.3

## 2023-08-28 MED ORDER — PANTOPRAZOLE SODIUM 40 MG PO TBEC
40.0000 mg | DELAYED_RELEASE_TABLET | Freq: Every day | ORAL | Status: DC
  Administered 2023-08-29 – 2023-08-30 (×2): 40 mg via ORAL
  Filled 2023-08-28 (×2): qty 1

## 2023-08-28 MED ORDER — BENZONATATE 100 MG PO CAPS
200.0000 mg | ORAL_CAPSULE | Freq: Three times a day (TID) | ORAL | Status: DC
Start: 2023-08-28 — End: 2023-08-30
  Administered 2023-08-28 – 2023-08-30 (×6): 200 mg via ORAL
  Filled 2023-08-28 (×6): qty 2

## 2023-08-28 MED ORDER — ALPRAZOLAM 1 MG PO TABS
1.0000 mg | ORAL_TABLET | Freq: Three times a day (TID) | ORAL | Status: DC | PRN
  Administered 2023-08-28 – 2023-08-29 (×2): 1 mg via ORAL
  Filled 2023-08-28: qty 1
  Filled 2023-08-28: qty 2

## 2023-08-28 MED ORDER — ONDANSETRON HCL 4 MG/2ML IJ SOLN
4.0000 mg | Freq: Four times a day (QID) | INTRAMUSCULAR | Status: DC | PRN
  Administered 2023-08-29: 4 mg via INTRAVENOUS
  Filled 2023-08-28: qty 2

## 2023-08-28 MED ORDER — HYDROCODONE-ACETAMINOPHEN 10-325 MG PO TABS
1.0000 | ORAL_TABLET | Freq: Four times a day (QID) | ORAL | Status: DC | PRN
  Administered 2023-08-28: 1 via ORAL
  Filled 2023-08-28: qty 1

## 2023-08-28 MED ORDER — ACETAMINOPHEN 500 MG PO TABS
1000.0000 mg | ORAL_TABLET | Freq: Once | ORAL | Status: AC
  Administered 2023-08-28: 1000 mg via ORAL
  Filled 2023-08-28: qty 2

## 2023-08-28 MED ORDER — SODIUM CHLORIDE 0.9% FLUSH
3.0000 mL | Freq: Two times a day (BID) | INTRAVENOUS | Status: DC
  Administered 2023-08-28 – 2023-08-30 (×4): 3 mL via INTRAVENOUS

## 2023-08-28 MED ORDER — OXYCODONE HCL 5 MG PO TABS: 5.0000 mg | ORAL_TABLET | ORAL | Status: DC | PRN

## 2023-08-28 MED ORDER — GABAPENTIN 100 MG PO CAPS
100.0000 mg | ORAL_CAPSULE | Freq: Every day | ORAL | Status: DC
Start: 2023-08-28 — End: 2023-08-30
  Administered 2023-08-28 – 2023-08-29 (×2): 100 mg via ORAL
  Filled 2023-08-28 (×2): qty 1

## 2023-08-28 NOTE — Assessment & Plan Note (Signed)
 Continue PPI ?

## 2023-08-28 NOTE — ED Provider Notes (Incomplete)
[place] EMERGENCY DEPARTMENT AT [facility] Provider Note   CSN: [id number] Arrival date & time: 08/28/23  4098     History {Add pertinent medical, surgical, social history, OB history to HPI:1} Chief Complaint  Patient presents with   Shortness of Breath    Martha [name] is a 58 y.o. female with PMH as listed below who presents BIB [facility]. Pt c/o SOB x3 days with productive cough, unsure what sputum looks like because she has difficulties with vision.  She endorses a history of COPD and has been using her inhalers at home which helps somewhat but not all the way.  Unknown if she has had any leg swelling.  Endorses central chest pain that is not exertional as well.  Pt sats in 70s with EMS. Given 2 albuterol tx in EMS which improved her sats, now in the ED satting 94% on 1 L nasal cannula.  Patient does not wear oxygen at home.  No known sick contacts.  Patient is dialysis patient, last dialysis was ***, got a full session.  Patient also complains of panic this morning and request her home Xanax.  Past Medical History:  Diagnosis Date   Acid reflux    takes Tums   Anemia    Arthritis    Bipolar 1 disorder (HCC)    Blindness of left eye    left eye removed   Carpal tunnel syndrome, bilateral    Cervical radiculopathy    CHF (congestive heart failure) (HCC)    Chronic kidney disease    Stage 5- 01/25/17   Constipation    COPD (chronic obstructive pulmonary disease) (HCC) 2014   bronchitis   Degenerative disc disease, thoracic    Depression    Diabetes mellitus    Type II   Diabetic retinopathy (HCC)    Dyspnea    when walking   End stage kidney disease (HCC)    M, W, F [facility]   Gout    Hypertension    Non-alcoholic cirrhosis (HCC)    Noncompliance with medication regimen    Noncompliance with medication regimen    Obesity (BMI 30-39.9)    OSA (obstructive sleep apnea)    cpap   Panic attack    RLS (restless legs syndrome)        Home  Medications Prior to Admission medications   Medication Sig Start Date End Date Taking? Authorizing Provider  ADVAIR DISKUS 250-50 MCG/ACT AEPB Inhale 1 puff into the lungs 2 (two) times daily. 07/01/22   [provider]  albuterol (PROVENTIL) (2.5 MG/3ML) 0.083% nebulizer solution Take 3 mLs (2.5 mg total) by nebulization every 12 (twelve) hours as needed for wheezing or shortness of breath. 08/05/22   Valentino Nose, NP  ALPRAZolam Prudy Feeler) 1 MG tablet Take 1 tablet (1 mg total) by mouth 4 (four) times daily as needed for anxiety. Patient taking differently: Take 1 mg by mouth 4 (four) times daily. 07/12/20   Rhetta Mura, MD  Artificial Tear Ointment (DRY EYES OP) Place 1 drop into the right eye 2 (two) times daily.    [provider]  atorvastatin (LIPITOR) 20 MG tablet Take by mouth. 10/21/18   [provider]  atropine (ISOPTO ATROPINE) 1 % ophthalmic solution Apply to eye. 12/27/19   [provider]  Blood Glucose Monitoring Suppl (FORA V30A BLOOD GLUCOSE SYSTEM) DEVI  09/22/20   [provider]  brimonidine (ALPHAGAN) 0.2 % ophthalmic solution PLACE 1 DROP INTO THE RIGHT EYE  3 TIMES A DAY. 05/24/21   [provider]  busPIRone (BUSPAR) 7.5 MG tablet Take 1 tablet (7.5 mg total) by mouth 2 (two) times daily. 07/12/20   Rhetta Mura, MD  calcium carbonate (TUMS EX) 750 MG chewable tablet Chew 1-3 tablets by mouth See admin instructions. Take 3 tablets with each meal & take 1 tablets with each snack    [provider]  canagliflozin (INVOKANA) 300 MG TABS tablet Take by mouth. 03/02/13   [provider]  cephALEXin (KEFLEX) 500 MG capsule Take 1 capsule (500 mg total) by mouth 2 (two) times daily. 04/17/23   Particia Nearing, PA-C  cinacalcet (SENSIPAR) 60 MG tablet Take 60 mg by mouth daily.    [provider]  colchicine 0.6 MG tablet TAKE 1 TABLET(0.6 MG) BY MOUTH DAILY 06/04/23   Felecia Shelling,  DPM  dextromethorphan (DELSYM) 30 MG/5ML liquid Take 5 mLs (30 mg total) by mouth 2 (two) times daily as needed for cough. 11/05/22   Johnson, Clanford L, MD  dorzolamide-timolol (COSOPT) 22.3-6.8 MG/ML ophthalmic solution Place 1 drop into the right eye in the morning and at bedtime. 06/01/20   [provider]  doxercalciferol (HECTOROL) 4 MCG/2ML injection Inject 0.5 mLs (1 mcg total) into the vein every Monday, Wednesday, and Friday with hemodialysis. 12/03/18   Kari Baars, MD  fluocinonide cream (LIDEX) 0.05 % Apply 1 Application topically 2 (two) times daily. 09/23/22   [provider]  fluticasone (CUTIVATE) 0.05 % cream Apply topically. 06/26/22   [provider]  FORACARE PREMIUM V10 TEST test strip  09/22/20   [provider]  gabapentin (NEURONTIN) 100 MG capsule Take 1 capsule (100 mg total) by mouth at bedtime. Patient taking differently: Take 300 mg by mouth at bedtime. 07/12/20   Rhetta Mura, MD  HYDROcodone-acetaminophen (NORCO) 10-325 MG tablet Take 1 tablet by mouth 4 (four) times daily. 08/30/20   [provider]  hydrOXYzine (ATARAX) 25 MG tablet Take 25 mg by mouth 2 (two) times daily. 07/11/22   [provider]  insulin aspart (NOVOLOG) 100 UNIT/ML injection Inject 4-7 Units into the skin 3 (three) times daily with meals. Sliding scale insulin 11/05/22   Johnson, Clanford L, MD  Insulin Glargine (BASAGLAR KWIKPEN) 100 UNIT/ML Inject 30 Units into the skin at bedtime. 07/12/20   Rhetta Mura, MD  ketoconazole (NIZORAL) 2 % cream Apply topically. 10/08/12   [provider]  latanoprost (XALATAN) 0.005 % ophthalmic solution Place 1 drop into the right eye 2 (two) times daily. 08/22/20   [provider]  leptospermum manuka honey (MEDIHONEY) PSTE paste Apply 1 Application topically daily. 01/06/23   Roemhildt, Lorin T, PA-C  lidocaine-prilocaine (EMLA) cream Apply 1 application topically as needed  (topical anesthesia for hemodialysis if Gebauers and Lidocaine injection are ineffective.). Patient taking differently: Apply 1 application  topically daily as needed (dialysis.). 04/25/17   Johnson, Clanford L, MD  omeprazole (PRILOSEC) 40 MG capsule Take 40 mg by mouth daily.    [provider]  ondansetron (ZOFRAN) 4 MG tablet TAKE (1) TABLET THREE TIMES DAILY AS NEEDED FOR NAUSEA. 07/30/22   [provider]  oxyCODONE (ROXICODONE) 5 MG immediate release tablet Take 1 tablet (5 mg total) by mouth every 6 (six) hours as needed for severe pain or breakthrough pain. 01/06/23   Roemhildt, Lorin T, PA-C  PARoxetine (PAXIL) 20 MG tablet Take 1 tablet (20 mg total) by mouth every evening. This is to prevent panic attacks Patient  taking differently: Take 20 mg by mouth at bedtime. 07/12/20   Rhetta Mura, MD  promethazine (PHENERGAN) 25 MG tablet Take 1 tablet (25 mg total) by mouth every 6 (six) hours as needed for nausea or vomiting. 01/06/23   Roemhildt, Lorin T, PA-C      Allergies    Codeine and Tape    Review of Systems   Review of Systems A 10 point review of systems was performed and is negative unless otherwise reported in HPI.  Physical Exam Updated Vital Signs BP (!) 180/75   Pulse 90   Temp (!) 100.5 F (38.1 C) (Oral)   Resp 18   Ht 5\' 7"  (1.702 m)   Wt 122.9 kg   LMP 08/03/2008 (Exact Date)   SpO2 97%   BMI 42.44 kg/m  Physical Exam General: Normal appearing female, lying in bed.  HEENT: PERRLA, Sclera anicteric, MMM, trachea midline.  Cardiology: RRR, no murmurs/rubs/gallops. BL radial and DP pulses equal bilaterally.  Resp: Normal respiratory effort. Mild tachypnea.  CTAB, no wheezes, rhonchi, crackles. On 1L [place] satting 94%.  Abd: Soft, non-tender, non-distended. No rebound tenderness or guarding.  GU: Deferred. MSK: No peripheral edema or signs of trauma. Extremities without deformity or TTP. No cyanosis or clubbing. Skin: warm, dry. No rashes or  lesions. Back: No CVA tenderness Neuro: A&Ox4, CNs II-XII grossly intact. MAEs. Sensation grossly intact.  Psych: Anxious, cooperative  ED Results / Procedures / Treatments   Labs (all labs ordered are listed, but only abnormal results are displayed) Labs Reviewed  RESP PANEL BY RT-PCR (RSV, FLU A&B, COVID)  [id number]  BASIC METABOLIC PANEL  BRAIN NATRIURETIC PEPTIDE  CBC WITH DIFFERENTIAL/PLATELET    EKG EKG Interpretation Date/Time:  Friday August 28 2023 08:33:25 EST Ventricular Rate:  96 PR Interval:  180 QRS Duration:  98 QT Interval:  351 QTC Calculation: 444 R Axis:   39  Text Interpretation: Sinus rhythm Low voltage, precordial leads Similar to prior Confirmed by Vivi Barrack [phone]) on 08/28/2023 9:21:27 AM  Radiology DG Chest Port 1 View Result Date: 08/28/2023 CLINICAL DATA:  SOB, hypoxia, COPD EXAM: PORTABLE CHEST 1 VIEW COMPARISON:  11/03/2022 chest radiograph. FINDINGS: Stable cardiomediastinal silhouette with top normal heart size. No pneumothorax. No pleural effusion. Mild diffuse prominence of the parahilar interstitial markings. IMPRESSION: Mild diffuse prominence of the parahilar interstitial markings, suggesting atypical/viral pneumonia or mild pulmonary edema. Electronically Signed   By: Delbert Phenix M.D.   On: 08/28/2023 09:10    Procedures Procedures  {Document cardiac monitor, telemetry assessment procedure when appropriate:1}  Medications Ordered in ED Medications  acetaminophen (TYLENOL) tablet 1,000 mg (has no administration in time range)  ipratropium-albuterol (DUONEB) 0.5-2.5 (3) MG/3ML nebulizer solution 3 mL (3 mLs Nebulization Given 08/28/23 0908)  ALPRAZolam Prudy Feeler) tablet 1 mg (1 mg Oral Given 08/28/23 0911)    ED Course/ Medical Decision Making/ A&P                          Medical Decision Making Amount and/or Complexity of Data Reviewed Labs: ordered. Decision-making details documented in ED Course. Radiology: ordered. Decision-making  details documented in ED Course.  Risk OTC drugs. Prescription drug management.    This patient presents to the ED for concern of ***, this involves an extensive number of treatment options, and is a complaint that carries with it a high risk of complications and morbidity.  I considered the following differential and admission for this acute, potentially  life threatening condition.   MDM:    DDX for dyspnea includes but is not limited to:  Cardiac- CHF, Myocardial Ischemia, Valvular heart disease, Arrythmia, Cardiac tamponade   Respiratory - Pneumonia / atelectasis / pulmonary effusion / cavitary lung disease, Pneumothorax, COPD/ reactive airway disease, PE, ARDS   Other - Sepsis, Anemia     Clinical Course as of 08/28/23 1111  Fri Aug 28, 2023  0918 DG Chest Port 1 View Mild diffuse prominence of the parahilar interstitial markings, suggesting atypical/viral pneumonia or mild pulmonary edema.   [HN]  0918 Temp(!): 100.5 F (38.1 C) [HN]  1019 Respiratory Syncytial Virus by PCR(!): POSITIVE +RSV [HN]  1103 Platelets(!): 86 Mildly decreased platelets from baseline thrombocytopenia [HN]  1103 B Natriuretic Peptide(!): 1,369.0 [HN]    Clinical Course User Index [HN] Loetta Rough, MD    Labs: I Ordered, and personally interpreted labs.  The pertinent results include:  those listed above  Imaging Studies ordered: I ordered imaging studies including CXR I independently visualized and interpreted imaging. I agree with the radiologist interpretation  Additional history obtained from chart review, EMS.    Cardiac Monitoring: The patient was maintained on a cardiac monitor.  I personally viewed and interpreted the cardiac monitored which showed an underlying rhythm of: NSR  Reevaluation: After the interventions noted above, I reevaluated the patient and found that they have :{resolved/improved/worsened:23923::"improved"}  Social Determinants of Health: Lives  independently  Disposition:  ***  Co morbidities that complicate the patient evaluation  Past Medical History:  Diagnosis Date   Acid reflux    takes Tums   Anemia    Arthritis    Bipolar 1 disorder (HCC)    Blindness of left eye    left eye removed   Carpal tunnel syndrome, bilateral    Cervical radiculopathy    CHF (congestive heart failure) (HCC)    Chronic kidney disease    Stage 5- 01/25/17   Constipation    COPD (chronic obstructive pulmonary disease) (HCC) 2014   bronchitis   Degenerative disc disease, thoracic    Depression    Diabetes mellitus    Type II   Diabetic retinopathy (HCC)    Dyspnea    when walking   End stage kidney disease (HCC)    M, W, F [facility]   Gout    Hypertension    Non-alcoholic cirrhosis (HCC)    Noncompliance with medication regimen    Noncompliance with medication regimen    Obesity (BMI 30-39.9)    OSA (obstructive sleep apnea)    cpap   Panic attack    RLS (restless legs syndrome)      Medicines Meds ordered this encounter  Medications   ipratropium-albuterol (DUONEB) 0.5-2.5 (3) MG/3ML nebulizer solution 3 mL   ALPRAZolam (XANAX) tablet 1 mg   acetaminophen (TYLENOL) tablet 1,000 mg    I have reviewed the patients home medicines and have made adjustments as needed  Problem List / ED Course: Problem List Items Addressed This Visit   None        {Document critical care time when appropriate:1} {Document review of labs and clinical decision tools ie heart score, Chads2Vasc2 etc:1}  {Document your independent review of radiology images, and any outside records:1} {Document your discussion with family members, caretakers, and with consultants:1} {Document social determinants of health affecting pt's care:1} {Document your decision making why or why not admission, treatments were needed:1}  This note was created using dictation software, which may contain  spelling or grammatical errors.

## 2023-08-28 NOTE — Assessment & Plan Note (Signed)
-  Acute respiratory failure, with hypoxia: Presented with O2 sat in 80s currently satting 94% on 1.4 L -Likely due to underlying causes of RSV infection, viral pneumonia -Continue supportive therapy -IV steroids -Supplemental oxygen, as needed DuoNeb bronchodilators -Mucolytics -As needed Tylenol -Lactic acid 1.3, pending procalcitonin level But holding antibiotic use at this point

## 2023-08-28 NOTE — Assessment & Plan Note (Signed)
 Continue supportive measures

## 2023-08-28 NOTE — ED Notes (Signed)
I ambulated patient on room air approx. 20 ft she said she doesn't walk far that she lives in an apartment and walks around the apartment. Her O2 stayed  between 92% - 89% while ambulating.

## 2023-08-28 NOTE — Progress Notes (Signed)
Pt went straight to dialysis from the ED. Moved pt onto the floor in order to pull and bring her meds to her. Dialysis will be completed close to 1900.

## 2023-08-28 NOTE — Progress Notes (Signed)
Brief Nephrology Note  Called about ESRD patient followed by [facility] here w/ RSV. Missed HD on Wednesday. Dialysis ordered for today. Given our limited coverage at [facility] will not be able to see at this time unless an urgent concern arises but will consult formally when able.

## 2023-08-28 NOTE — Progress Notes (Signed)
Patient states that she would not want to be intubated or resuscitated in a code event. Code status updated in chart.

## 2023-08-28 NOTE — ED Triage Notes (Signed)
Pt arrived by [facility]. Pt c/o SOB x3 days with hx of COPD and dialysis pt. Pt sats in 70s with EMS. Given 2 albuterol tx in EMS

## 2023-08-28 NOTE — Assessment & Plan Note (Signed)
-   Pancytopenia with acute on chronic anemia due to chronic kidney disease, monitoring H&H currently stable

## 2023-08-28 NOTE — Assessment & Plan Note (Signed)
On hemodialysis Monday Wednesdays and Fridays, - Missed hemodialysis on Wednesday, pursuing with hemodialysis today nephrologist consulted

## 2023-08-28 NOTE — Assessment & Plan Note (Addendum)
Home medication will be reviewed, it apparently patient has not taken Invokana. -Will check [facility] q. [facility], with [facility] coverage

## 2023-08-28 NOTE — Progress Notes (Signed)
Pt had cramping twice during TX. Pt goal met.  08/28/23 1852  Vitals  Temp 98.4 F (36.9 C)  Temp Source Oral  BP (!) 155/71  BP Location Right Arm  BP Method Automatic  Patient Position (if appropriate) Lying  Pulse Rate 84  Resp 20  Oxygen Therapy  SpO2 100 %  O2 Device Nasal Cannula  O2 Flow Rate (L/min) 2 L/min  During Treatment Monitoring  Intra-Hemodialysis Comments Tx completed  Post Treatment  Dialyzer Clearance Lightly streaked  Hemodialysis Intake (mL) 0 mL  Liters Processed 78  Fluid Removed (mL) 2600 mL  Tolerated HD Treatment Yes  Post-Hemodialysis Comments Pt goal met.  AVG/AVF Arterial Site Held (minutes) 10 minutes  AVG/AVF Venous Site Held (minutes) 10 minutes

## 2023-08-28 NOTE — Assessment & Plan Note (Signed)
-   Med rec indicate patient is on a statin Lipitor We will reevaluate anticipating to initiate but patient has a history of NASH may be a contraindication

## 2023-08-28 NOTE — Assessment & Plan Note (Signed)
With end-stage renal disease, missed hemodialysis on Wednesday, nephrologist consulted pursuing hemodialysis today Monitoring closely

## 2023-08-28 NOTE — Assessment & Plan Note (Signed)
 Stable, no changes

## 2023-08-28 NOTE — H&P (Signed)
History and Physical   Patient: Martha [name]                            PCP: Carmel Sacramento, NP                    DOB: 1965-10-26            DOA: 08/28/2023 WJX:[id number]             DOS: 08/28/2023, 3:21 PM  Carmel Sacramento, NP  Patient coming from:   HOME  I have personally reviewed patient's medical records, in electronic medical records, including:  [facility], and care everywhere.    Chief Complaint:   Chief Complaint  Patient presents with   Shortness of Breath    History of present illness:    Martha [name] is a 58 year old female with multiple comorbidities including ESRD on HD MWF, HTN, NASH, COPD, anemia of chronic disease, arthritis, bipolar type I, left eye blindness, radiculopathy, CHF,DDD, DM type II with retinopathy, gout, RLS, panic attacks ....   Presented with missed hemodialysis this past Wednesday, with shortness of breath, cough x 3 days, was found in respiratory distress, mild hypoxia satting 89-92% on room air, with supplemental oxygen satting 94%  ED course/evaluation: Blood pressure (!) 142/106, pulse 74, temperature (!) 100.8 F (38.2 C), temperature source Oral, RR 18, SpO2 94%.  1.5 L  CBC WBC 3.1, hemoglobin 9.4, platelet 86 CMP BUN 38 creatinine 11.5 Glucose 143 BNP 1369.0, lactic acid 1.3 Influenza A, B, SARS-CoV-2 negative RSV positive  Chest x-ray: IMPRESSION: Mild diffuse prominence of the parahilar interstitial markings, suggesting atypical/viral pneumonia or mild pulmonary edema.     Patient Denies having: Fever, Chills, Cough, SOB, Chest Pain, Abd pain, N/V/D, headache, dizziness, lightheadedness,  Dysuria, Joint pain, rash, open wounds    Review of Systems: As per HPI, otherwise 10 point review of systems were negative.   ----------------------------------------------------------------------------------------------------------------------  Allergies  Allergen Reactions   Codeine Nausea And Vomiting   Tape Rash and Other  (See Comments)    Pull skin off.  Paper tape is ok    Home MEDs:  Prior to Admission medications   Medication Sig Start Date End Date Taking? Authorizing Provider  albuterol (PROVENTIL) (2.5 MG/3ML) 0.083% nebulizer solution Take 3 mLs (2.5 mg total) by nebulization every 12 (twelve) hours as needed for wheezing or shortness of breath. 08/05/22  Yes Valentino Nose, NP  ALPRAZolam Prudy Feeler) 1 MG tablet Take 1 tablet (1 mg total) by mouth 4 (four) times daily as needed for anxiety. Patient taking differently: Take 1 mg by mouth 4 (four) times daily. 07/12/20  Yes Rhetta Mura, MD  atorvastatin (LIPITOR) 20 MG tablet Take by mouth. 10/21/18  Yes [provider]  busPIRone (BUSPAR) 7.5 MG tablet Take 1 tablet (7.5 mg total) by mouth 2 (two) times daily. 07/12/20  Yes Rhetta Mura, MD  calcium carbonate (TUMS EX) 750 MG chewable tablet Chew 1-3 tablets by mouth See admin instructions. Take 3 tablets with each meal & take 1 tablets with each snack   Yes [provider]  HYDROcodone-acetaminophen (NORCO) 10-325 MG tablet Take 1 tablet by mouth 4 (four) times daily. 08/30/20  Yes [provider]  lidocaine-prilocaine (EMLA) cream Apply 1 application topically as needed (topical anesthesia for hemodialysis if Gebauers and Lidocaine injection are ineffective.). Patient taking differently: Apply 1 application  topically daily as needed (dialysis.). 04/25/17  Yes Johnson, Clanford L, MD  ondansetron (ZOFRAN) 4 MG tablet TAKE (1) TABLET THREE TIMES DAILY AS NEEDED FOR NAUSEA. 07/30/22  Yes [provider]  ADVAIR DISKUS 250-50 MCG/ACT AEPB Inhale 1 puff into the lungs 2 (two) times daily. Patient not taking: Reported on 08/28/2023 07/01/22   [provider]  Artificial Tear Ointment (DRY EYES OP) Place 1 drop into the right eye 2 (two) times daily.    [provider]  atropine (ISOPTO ATROPINE) 1 % ophthalmic solution Apply to eye. 12/27/19    [provider]  Blood Glucose Monitoring Suppl (FORA V30A BLOOD GLUCOSE SYSTEM) DEVI  09/22/20   [provider]  brimonidine (ALPHAGAN) 0.2 % ophthalmic solution PLACE 1 DROP INTO THE RIGHT EYE 3 TIMES A DAY. 05/24/21   [provider]  canagliflozin (INVOKANA) 300 MG TABS tablet Take by mouth. Patient not taking: Reported on 08/28/2023 03/02/13   [provider]  cephALEXin (KEFLEX) 500 MG capsule Take 1 capsule (500 mg total) by mouth 2 (two) times daily. Patient not taking: Reported on 08/28/2023 04/17/23   Particia Nearing, PA-C  cinacalcet (SENSIPAR) 60 MG tablet Take 60 mg by mouth daily.    [provider]  colchicine 0.6 MG tablet TAKE 1 TABLET(0.6 MG) BY MOUTH DAILY 06/04/23   Felecia Shelling, DPM  dextromethorphan (DELSYM) 30 MG/5ML liquid Take 5 mLs (30 mg total) by mouth 2 (two) times daily as needed for cough. 11/05/22   Johnson, Clanford L, MD  dorzolamide-timolol (COSOPT) 22.3-6.8 MG/ML ophthalmic solution Place 1 drop into the right eye in the morning and at bedtime. 06/01/20   [provider]  doxercalciferol (HECTOROL) 4 MCG/2ML injection Inject 0.5 mLs (1 mcg total) into the vein every Monday, Wednesday, and Friday with hemodialysis. 12/03/18   Kari Baars, MD  fluocinonide cream (LIDEX) 0.05 % Apply 1 Application topically 2 (two) times daily. Patient not taking: Reported on 08/28/2023 09/23/22   [provider]  fluticasone (CUTIVATE) 0.05 % cream Apply topically. Patient not taking: Reported on 08/28/2023 06/26/22   [provider]  Maudie Flakes PREMIUM V10 TEST test strip  09/22/20   [provider]  gabapentin (NEURONTIN) 100 MG capsule Take 1 capsule (100 mg total) by mouth at bedtime. Patient taking differently: Take 300 mg by mouth at bedtime. 07/12/20   Rhetta Mura, MD  hydrOXYzine (ATARAX) 25 MG tablet Take 25 mg by mouth 2 (two) times daily. Patient not taking: Reported on 08/28/2023  07/11/22   [provider]  insulin aspart (NOVOLOG) 100 UNIT/ML injection Inject 4-7 Units into the skin 3 (three) times daily with meals. Sliding scale insulin 11/05/22   Johnson, Clanford L, MD  Insulin Glargine (BASAGLAR KWIKPEN) 100 UNIT/ML Inject 30 Units into the skin at bedtime. 07/12/20   Rhetta Mura, MD  ketoconazole (NIZORAL) 2 % cream Apply topically. Patient not taking: Reported on 08/28/2023 10/08/12   [provider]  latanoprost (XALATAN) 0.005 % ophthalmic solution Place 1 drop into the right eye 2 (two) times daily. 08/22/20   [provider]  leptospermum manuka honey (MEDIHONEY) PSTE paste Apply 1 Application topically daily. Patient not taking: Reported on 08/28/2023 01/06/23   Roemhildt, Lorin T, PA-C  omeprazole (PRILOSEC) 40 MG capsule Take 40 mg by mouth daily.    [provider]  oxyCODONE (ROXICODONE) 5 MG immediate release tablet Take 1 tablet (5 mg total) by mouth every 6 (six) hours as needed for severe pain or breakthrough pain. Patient not taking:  Reported on 08/28/2023 01/06/23   Roemhildt, Lorin T, PA-C  PARoxetine (PAXIL) 20 MG tablet Take 1 tablet (20 mg total) by mouth every evening. This is to prevent panic attacks Patient taking differently: Take 20 mg by mouth at bedtime. 07/12/20   Rhetta Mura, MD  promethazine (PHENERGAN) 25 MG tablet Take 1 tablet (25 mg total) by mouth every 6 (six) hours as needed for nausea or vomiting. Patient not taking: Reported on 08/28/2023 01/06/23   Roemhildt, Lorin T, PA-C    PRN MEDs: acetaminophen **OR** acetaminophen, bisacodyl, hydrALAZINE, HYDROmorphone (DILAUDID) injection, ipratropium, ondansetron **OR** ondansetron (ZOFRAN) IV, oxyCODONE, senna-docusate, sodium phosphate, traZODone  Past Medical History:  Diagnosis Date   Acid reflux    takes Tums   Anemia    Arthritis    Bipolar 1 disorder (HCC)    Blindness of left eye    left eye removed   Carpal tunnel syndrome,  bilateral    Cervical radiculopathy    CHF (congestive heart failure) (HCC)    Chronic kidney disease    Stage 5- 01/25/17   Constipation    COPD (chronic obstructive pulmonary disease) (HCC) 2014   bronchitis   Degenerative disc disease, thoracic    Depression    Diabetes mellitus    Type II   Diabetic retinopathy (HCC)    Dyspnea    when walking   End stage kidney disease (HCC)    M, W, F [facility]   Gout    Hypertension    Non-alcoholic cirrhosis (HCC)    Noncompliance with medication regimen    Noncompliance with medication regimen    Obesity (BMI 30-39.9)    OSA (obstructive sleep apnea)    cpap   Panic attack    RLS (restless legs syndrome)     Past Surgical History:  Procedure Laterality Date   AORTIC ARCH ANGIOGRAPHY N/A 09/11/2020   Procedure: AORTIC ARCH ANGIOGRAPHY;  Surgeon: Nada Libman, MD;  [facility]: [facility];  Service: Cardiovascular;  Laterality: N/A;   AV FISTULA PLACEMENT Right 01/27/2017   Procedure: ARTERIOVENOUS (AV) FISTULA CREATION-RIGHT ARM;  Surgeon: Sherren Kerns, MD;  Location: [facility] OR;  Service: Vascular;  Laterality: Right;   AV FISTULA PLACEMENT Left 08/03/2018   Procedure: LEFT ARTERIOVENOUS (AV) FISTULA CREATION;  Surgeon: Sherren Kerns, MD;  Location: [facility] OR;  Service: Vascular;  Laterality: Left;   BASCILIC VEIN TRANSPOSITION Left 04/26/2019   Procedure: SECOND STAGE BASILIC VEIN TRANSPOSITION LEFT ARM;  Surgeon: Sherren Kerns, MD;  Location: [facility] OR;  Service: Vascular;  Laterality: Left;   BIOPSY N/A 05/17/2013   Procedure: BIOPSY;  Surgeon: West Bali, MD;  Location: [facility] ORS;  Service: Endoscopy;  Laterality: N/A;   CARPAL TUNNEL RELEASE Left 11/25/2018   Procedure: LEFT CARPAL TUNNEL RELEASE;  Surgeon: Dairl Ponder, MD;  Location: [facility] OR;  Service: Orthopedics;  Laterality: Left;   CATARACT EXTRACTION Right    CHOLECYSTECTOMY N/A 04/27/2014   Procedure: LAPAROSCOPIC CHOLECYSTECTOMY;  Surgeon: Marlane Hatcher, MD;  Location: [facility];  Service: General;  Laterality: N/A;   COLONOSCOPY  06/06/2010   BJY:NWGNFA bleeding secondary to internal hemorrhoids but incomplete evaluation secondary to poor right colon prep/small rectal and sigmoid colon polyps (hyperplastic). PROPOFOL   COLONOSCOPY  May 2013   Dr. Gilliam/[facility]: 5 mm a descending colon polyp, hyperplastic. Adequate bowel prep.   ENDOMETRIAL ABLATION     ENUCLEATION Left 02/04/2018   ENUCLEATION WITH PLACEMENT OF IMPLANT LEFT EYE   ENUCLEATION  Left 02/04/2018   Procedure: ENUCLEATION WITH PLACEMENT OF IMPLANT LEFT EYE;  Surgeon: Floydene Flock, MD;  Location: [facility] OR;  Service: Ophthalmology;  Laterality: Left;   ESOPHAGOGASTRODUODENOSCOPY  06/06/2010   QMV:HQIONGE erythema and edema of body of stomach, with sessile polypoid lesions. bx benign. no h.pylori   ESOPHAGOGASTRODUODENOSCOPY (EGD) WITH PROPOFOL N/A 05/17/2013   Dr. Darrick Penna: normal esophagus, moderate nodular gastritis, negative path, empiric Savary dilation   EYE SURGERY  07/2017   sx for glaucoma   FLEXIBLE SIGMOIDOSCOPY N/A 05/17/2013   Dr. Darrick Penna: moderate sized internal hemorrhoids   HEMORRHOID BANDING N/A 05/17/2013   Procedure: HEMORRHOID BANDING;  Surgeon: West Bali, MD;  Location: [facility];  Service: Endoscopy;  Laterality: N/A;  2 bands placed   INCISION AND DRAINAGE ABSCESS Left 10/11/2013   Procedure: INCISION AND DRAINAGE AND DEBRIDEMENT LEFT BREAST  ABSCESS;  Surgeon: Marlane Hatcher, MD;  Location: [facility];  Service: General;  Laterality: Left;   INSERTION OF DIALYSIS CATHETER N/A 06/08/2018   Procedure: INSERTION OF Right internal jugular TUNNELED  DIALYSIS CATHETER;  Surgeon: Chuck Hint, MD;  Location[facility] OR;  Service: Vascular;  Laterality: N/A;   IRRIGATION AND DEBRIDEMENT ABSCESS Right 06/01/2013   Procedure: INCISION AND DRAINAGE AND DEBRIDEMENT ABSCESS RIGHT BREAST;  Surgeon: Marlane Hatcher, MD;  Location: [facility];  Service: General;   Laterality: Right;   LEFT EYE REMOVED Left 01/2018   [facility] on [address].   LIGATION OF ARTERIOVENOUS  FISTULA Right 06/08/2018   Procedure: LIGATION OF ARTERIOVENOUS  FISTULA RIGHT ARM;  Surgeon: Chuck Hint, MD;  Location: [facility] OR;  Service: Vascular;  Laterality: Right;   ORIF ANKLE FRACTURE Right 12/17/2018   Procedure: OPEN REDUCTION INTERNAL FIXATION (ORIF) ANKLE FRACTURE;  Surgeon: Felecia Shelling, DPM;  Location: [facility] OR;  Service: Podiatry;  Laterality: Right;   REVISON OF ARTERIOVENOUS FISTULA Left 09/26/2020   Procedure: LEFT ARTERIOVENOUS FISTULA  DISTAL REVASCULARIZATION AND LIGATION ( DRIL ) USING GREATER SAPHENOUS VEIN;  Surgeon: Maeola Harman, MD;  Location: [facility] OR;  Service: Vascular;  Laterality: Left;   SAVORY DILATION N/A 05/17/2013   Procedure: SAVORY DILATION;  Surgeon: West Bali, MD;  Location: [facility];  Service: Endoscopy;  Laterality: N/A;  14/15/16   SKIN FULL THICKNESS GRAFT Left 05/05/2018   Procedure: ABDOMINAL DERMIS FAT SKIN GRAFT FULL THICKNESS LEFT EYE;  Surgeon: Floydene Flock, MD;  Location: [facility] OR;  Service: Ophthalmology;  Laterality: Left;   TUBAL LIGATION     VEIN HARVEST Left 09/26/2020   Procedure: LEFT LEG GREATER SAPHENOUS VEIN HARVEST;  Surgeon: Maeola Harman, MD;  Location: [facility] OR;  Service: Vascular;  Laterality: Left;     reports that she has been smoking cigarettes and e-cigarettes. She started smoking about 40 years ago. She has a 37.5 pack-year smoking history. She has never used smokeless tobacco. She reports that she does not drink alcohol and does not use drugs.   Family History  Adopted: Yes  Family history unknown: Yes    Physical Exam:   Vitals:   08/28/23 1115 08/28/23 1145 08/28/23 1200 08/28/23 1300  BP: (!) 166/75 (!) 161/79 (!) 170/80 (!) 142/106  Pulse: 86 91 77 74  Resp:   18   Temp:   (!) 100.8 F (38.2 C)   TempSrc:   Oral   SpO2: 94% (!) 89% 96% 94%  Weight:       Height:       Constitutional:  NAD, calm, comfortable Eyes: PERRL, lids and conjunctivae normal ENMT: left Eye blindness -Mucous membranes are moist. Posterior pharynx clear of any exudate or lesions.Normal dentition.  Neck: normal, supple, no masses, no thyromegaly Respiratory: clear to auscultation bilaterally, no wheezing, no crackles. Normal respiratory effort. No accessory muscle use.  Cardiovascular: Regular rate and rhythm, no murmurs / rubs / gallops. No extremity edema. 2+ pedal pulses. No carotid bruits.  Abdomen: no tenderness, no masses palpated. No hepatosplenomegaly. Bowel sounds positive.  Musculoskeletal: no clubbing / cyanosis. No joint deformity upper and lower extremities. Good ROM, no contractures. Normal muscle tone.  Neurologic: CN II-XII grossly intact. Sensation intact, DTR normal. Strength 5/5 in all 4.  Psychiatric: Normal judgment and insight. Alert and oriented x 3. Normal mood.  Skin: no rashes, lesions, ulcers. No induration          Labs on admission:    I have personally reviewed following labs and imaging studies  CBC: Recent Labs  Lab 08/28/23 0913  WBC 3.1*  NEUTROABS 2.4  HGB 9.4*  HCT 30.7*  MCV 91.1  PLT 86*   Basic Metabolic Panel: Recent Labs  Lab 08/28/23 0913  NA 139  K 4.9  CL 99  CO2 24  GLUCOSE 143*  BUN 38*  CREATININE 11.51*  CALCIUM 8.9    Urine analysis:    Component Value Date/Time   COLORURINE YELLOW 03/06/2020 0311   APPEARANCEUR HAZY (A) 03/06/2020 0311   LABSPEC 1.019 03/06/2020 0311   PHURINE 6.0 03/06/2020 0311   GLUCOSEU 150 (A) 03/06/2020 0311   HGBUR SMALL (A) 03/06/2020 0311   BILIRUBINUR NEGATIVE 03/06/2020 0311   KETONESUR NEGATIVE 03/06/2020 0311   PROTEINUR >=300 (A) 03/06/2020 0311   UROBILINOGEN 0.2 03/17/2015 2236   NITRITE NEGATIVE 03/06/2020 0311   LEUKOCYTESUR NEGATIVE 03/06/2020 0311    Last A1C:  Lab Results  Component Value Date   HGBA1C 5.6 11/04/2022     Radiologic  Exams on Admission:   DG Chest Port 1 View Result Date: 08/28/2023 CLINICAL DATA:  SOB, hypoxia, COPD EXAM: PORTABLE CHEST 1 VIEW COMPARISON:  11/03/2022 chest radiograph. FINDINGS: Stable cardiomediastinal silhouette with top normal heart size. No pneumothorax. No pleural effusion. Mild diffuse prominence of the parahilar interstitial markings. IMPRESSION: Mild diffuse prominence of the parahilar interstitial markings, suggesting atypical/viral pneumonia or mild pulmonary edema. Electronically Signed   By: Delbert Phenix M.D.   On: 08/28/2023 09:10    EKG:   Independently reviewed.  Orders placed or performed during the hospital encounter of 08/28/23   ED EKG   ED EKG   EKG 12-Lead   EKG 12-Lead   EKG 12-Lead   ---------------------------------------------------------------------------------------------------------------------------------------    Assessment / Plan:   Principal Problem:   RSV (acute bronchiolitis due to respiratory syncytial virus) Active Problems:   Acute respiratory failure with hypoxia (HCC)   Controlled type 2 diabetes mellitus without complication, without long-term current use of insulin (HCC)   GERD   Hyperlipidemia   Essential hypertension   Left hemiparesis (HCC)   HTN (hypertension), malignant   Bipolar 1 disorder (HCC)   Anasarca associated with disorder of kidney   Chronic diarrhea   Anemia in chronic kidney disease   ESRD (end stage renal disease) (HCC)   Assessment and Plan: * RSV (acute bronchiolitis due to respiratory syncytial virus) Continue supportive measures  Acute respiratory failure with hypoxia (HCC) -Acute respiratory failure, with hypoxia: Presented with O2 sat in 80s currently satting 94% on 1.4 L -Likely due to  underlying causes of RSV infection, viral pneumonia -Continue supportive therapy -IV steroids -Supplemental oxygen, as needed DuoNeb bronchodilators -Mucolytics -As needed Tylenol -Lactic acid 1.3, pending  procalcitonin level But holding antibiotic use at this point  Controlled type 2 diabetes mellitus without complication, without long-term current use of insulin (HCC) Home medication will be reviewed, it apparently patient has not taken Invokana. -Will check CBG q. ACH, with SSI coverage   GERD -Continue PPI  ESRD (end stage renal disease) (HCC) On hemodialysis Monday Wednesdays and Fridays, - Missed hemodialysis on Wednesday, pursuing with hemodialysis today nephrologist consulted  Anemia in chronic kidney disease - Pancytopenia with acute on chronic anemia due to chronic kidney disease, monitoring H&H currently stable  Chronic diarrhea -Chronic diarrhea noted Tronic medical record reviewed -Initiating as needed Lomotil -No signs of infectious colitis or C. difficile  Anasarca associated with disorder of kidney With end-stage renal disease, missed hemodialysis on Wednesday, nephrologist consulted pursuing hemodialysis today Monitoring closely  Bipolar 1 disorder (HCC) Home medication being reviewed in detail, will resume accordingly once confirmed As needed anxiolytics-Xanax  HTN (hypertension), malignant - History of hypertension currently not on any BP meds, monitoring closely home medication being reviewed -As needed hydralazine  Left hemiparesis (HCC) Stable, no changes  Hyperlipidemia - Med rec indicate patient is on a statin Lipitor We will reevaluate anticipating to initiate but patient has a history of NASH may be a contraindication      Consults called:  None -------------------------------------------------------------------------------------------------------------------------------------------- DVT prophylaxis:  heparin injection 5,000 Units Start: 08/28/23 2200 TED hose Start: 08/28/23 1358 SCDs Start: 08/28/23 1358   Code Status:   Code Status: Full Code   Admission status: Patient will be admitted as Inpatient, with a greater than 2 midnight  length of stay. Level of care: Stepdown   Family Communication:  none at bedside  (The above findings and plan of care has been discussed with patient in detail, the patient expressed understanding and agreement of above plan)  --------------------------------------------------------------------------------------------------------------------------------------------------  Disposition Plan:  Anticipated 1-2 days Status is: Inpatient Remains inpatient appropriate because: Needing hemodialysis, breathing treatments, oxygen supplement, close monitoring for acute respiratory failure due to RSV pneumonia     ----------------------------------------------------------------------------------------------------------------------------------------------------  Time spent:  71  Min.  Was spent seeing and evaluating the patient, reviewing all medical records, drawn plan of care.  SIGNED: Kendell Bane, MD, FHM. FAAFP. [facility], Pager  (Please use [email or web address] to page/ or secure chat through epic) If 7PM-7AM, please contact night-coverage [email or web address],  08/28/2023, 3:21 PM  For on call review [email or web address].

## 2023-08-28 NOTE — Assessment & Plan Note (Signed)
Home medication being reviewed in detail, will resume accordingly once confirmed As needed anxiolytics-Xanax

## 2023-08-28 NOTE — Assessment & Plan Note (Signed)
-   History of hypertension currently not on any BP meds, monitoring closely home medication being reviewed -As needed hydralazine

## 2023-08-28 NOTE — TOC CM/SW Note (Signed)
Transition of Care [facility]) - Inpatient Brief Assessment   Patient Details  Name: Martha [name] MRN: [id number] Date of Birth: [birth year]  [facility]) CM/SW Contact:    Villa Herb, LCSWA Phone Number: 08/28/2023, 3:09 PM   Clinical Narrative: Tranisition of Care Department [facility]) has reviewed patient and no TOC needs have been ifentified at this time. We will continue to monitor patient advancement through interdisciplinary progression rounds. If new patient transition needs arise, please place a TOC consult.   Transition of Care Asessment: Insurance and Status: Insurance coverage has been reviewed Patient has primary care physician: Yes Home environment has been reviewed: From home Prior level of function:: Independent Prior/Current Home Services: No current home services Social Drivers of Health Review: SDOH reviewed no interventions necessary Readmission risk has been reviewed: Yes Transition of care needs: no transition of care needs at this time

## 2023-08-28 NOTE — Hospital Course (Signed)
Martha [name] is a 58 year old female with multiple comorbidities including ESRD on HD MWF, HTN, NASH, COPD, anemia of chronic disease, arthritis, bipolar type I, left eye blindness, radiculopathy, CHF,DDD, DM type II with retinopathy, gout, RLS, panic attacks ....   Presented with missed hemodialysis this past Wednesday, with shortness of breath, cough x 3 days, was found in respiratory distress, mild hypoxia satting 89-92% on room air, with supplemental oxygen satting 94%  ED course/evaluation: Blood pressure (!) 142/106, pulse 74, temperature (!) 100.8 F (38.2 C), temperature source Oral, RR 18, SpO2 94%.  1.5 L  CBC WBC 3.1, hemoglobin 9.4, platelet 86 CMP BUN 38 creatinine 11.5 Glucose 143 BNP 1369.0, lactic acid 1.3 Influenza A, B, SARS-CoV-2 negative RSV positive  Chest x-ray: IMPRESSION: Mild diffuse prominence of the parahilar interstitial markings, suggesting atypical/viral pneumonia or mild pulmonary edema.

## 2023-08-28 NOTE — Assessment & Plan Note (Signed)
-  Chronic diarrhea noted Tronic medical record reviewed -Initiating as needed Lomotil -No signs of infectious colitis or C. difficile

## 2023-08-29 DIAGNOSIS — J210 Acute bronchiolitis due to respiratory syncytial virus: Secondary | ICD-10-CM | POA: Diagnosis not present

## 2023-08-29 LAB — CBC
HCT: 30.4 % — ABNORMAL LOW (ref 36.0–46.0)
Hemoglobin: 9.2 g/dL — ABNORMAL LOW (ref 12.0–15.0)
MCH: 27.1 pg (ref 26.0–34.0)
MCHC: 30.3 g/dL (ref 30.0–36.0)
MCV: 89.4 fL (ref 80.0–100.0)
Platelets: 68 10*3/uL — ABNORMAL LOW (ref 150–400)
RBC: 3.4 MIL/uL — ABNORMAL LOW (ref 3.87–5.11)
RDW: 18.6 % — ABNORMAL HIGH (ref 11.5–15.5)
WBC: 2.2 10*3/uL — ABNORMAL LOW (ref 4.0–10.5)
nRBC: 0 % (ref 0.0–0.2)

## 2023-08-29 LAB — BASIC METABOLIC PANEL
Anion gap: 15 (ref 5–15)
BUN: 32 mg/dL — ABNORMAL HIGH (ref 6–20)
CO2: 25 mmol/L (ref 22–32)
Calcium: 9.1 mg/dL (ref 8.9–10.3)
Chloride: 95 mmol/L — ABNORMAL LOW (ref 98–111)
Creatinine, Ser: 7.59 mg/dL — ABNORMAL HIGH (ref 0.44–1.00)
GFR, Estimated: 6 mL/min — ABNORMAL LOW (ref >60)
Glucose, Bld: 176 mg/dL — ABNORMAL HIGH (ref 70–99)
Potassium: 4.9 mmol/L (ref 3.5–5.1)
Sodium: 135 mmol/L (ref 135–145)

## 2023-08-29 LAB — GLUCOSE, CAPILLARY: Glucose-Capillary: 178 mg/dL — ABNORMAL HIGH (ref 70–99)

## 2023-08-29 LAB — EXPECTORATED SPUTUM ASSESSMENT W GRAM STAIN, RFLX TO RESP C

## 2023-08-29 LAB — APTT: aPTT: 31 s (ref 24–36)

## 2023-08-29 MED ORDER — PROCHLORPERAZINE EDISYLATE 10 MG/2ML IJ SOLN
5.0000 mg | Freq: Four times a day (QID) | INTRAMUSCULAR | Status: DC | PRN
  Administered 2023-08-29: 5 mg via INTRAVENOUS
  Filled 2023-08-29: qty 2

## 2023-08-29 MED ORDER — LATANOPROST 0.005 % OP SOLN
1.0000 [drp] | Freq: Two times a day (BID) | OPHTHALMIC | Status: DC
  Administered 2023-08-29 – 2023-08-30 (×2): 1 [drp] via OPHTHALMIC
  Filled 2023-08-29 (×2): qty 2.5

## 2023-08-29 MED ORDER — FUROSEMIDE 10 MG/ML IJ SOLN
40.0000 mg | Freq: Once | INTRAMUSCULAR | Status: AC
  Administered 2023-08-29: 40 mg via INTRAVENOUS
  Filled 2023-08-29: qty 4

## 2023-08-29 MED ORDER — RISAQUAD PO CAPS
2.0000 | ORAL_CAPSULE | Freq: Three times a day (TID) | ORAL | Status: DC
  Administered 2023-08-29 – 2023-08-30 (×3): 2 via ORAL
  Filled 2023-08-29 (×3): qty 2

## 2023-08-29 MED ORDER — METHYLPREDNISOLONE SODIUM SUCC 40 MG IJ SOLR
40.0000 mg | Freq: Two times a day (BID) | INTRAMUSCULAR | Status: DC
  Administered 2023-08-29 – 2023-08-30 (×2): 40 mg via INTRAVENOUS
  Filled 2023-08-29 (×2): qty 1

## 2023-08-29 NOTE — Progress Notes (Signed)
Patient presents to the floor with left eye redness; the redness extends down toward her cheek and to her forehead; and patient has drainage around her eye prosthetic. When asked about the drainage; states it has been going on for a long time. Patient has redness to her groin/ abd folds, with two areas that are scabbed; almost matching on either side. Patient was able to demonstrate appropriate use of the call bell and note which button was used for what including the nurse button.

## 2023-08-29 NOTE — Progress Notes (Signed)
PROGRESS NOTE    Patient: Martha [name]                            PCP: Carmel Sacramento, NP                    DOB: June 25, 1966            DOA: 08/28/2023 ZOX:[id number]             DOS: 08/29/2023, 10:49 AM   LOS: 1 day   Date of Service: The patient was seen and examined on 08/29/2023  Subjective:   The patient was seen and examined this morning.  Sleepy otherwise awake alert cooperative Hemodynamically stable. On CPAP this morning,  Brief Narrative:   BLAYRE PAPANIA is a 58 year old female with multiple comorbidities including ESRD on HD MWF, HTN, NASH, COPD, anemia of chronic disease, arthritis, bipolar type I, left eye blindness, radiculopathy, CHF,DDD, DM type II with retinopathy, gout, RLS, panic attacks ....   Presented with missed hemodialysis this past Wednesday, with shortness of breath, cough x 3 days, was found in respiratory distress, mild hypoxia satting 89-92% on room air, with supplemental oxygen satting 94%  ED course/evaluation: Blood pressure (!) 142/106, pulse 74, temperature (!) 100.8 F (38.2 C), temperature source Oral, RR 18, SpO2 94%.  1.5 L  CBC WBC 3.1, hemoglobin 9.4, platelet 86 CMP BUN 38 creatinine 11.5 Glucose 143 BNP 1369.0, lactic acid 1.3 Influenza A, B, SARS-CoV-2 negative RSV positive  Chest x-ray: IMPRESSION: Mild diffuse prominence of the parahilar interstitial markings, suggesting atypical/viral pneumonia or mild pulmonary edema.       Assessment & Plan:   Principal Problem:   RSV (acute bronchiolitis due to respiratory syncytial virus) Active Problems:   Acute respiratory failure with hypoxia (HCC)   Controlled type 2 diabetes mellitus without complication, without long-term current use of insulin (HCC)   GERD   Hyperlipidemia   Essential hypertension   Left hemiparesis (HCC)   HTN (hypertension), malignant   Bipolar 1 disorder (HCC)   Anasarca associated with disorder of kidney   Chronic diarrhea   Anemia in chronic kidney  disease   ESRD (end stage renal disease) (HCC)     Assessment and Plan: * RSV (acute bronchiolitis due to respiratory syncytial virus) Continue supportive measures  Acute respiratory failure with hypoxia (HCC) -On CPAP this morning  -POA: Acute respiratory failure, with hypoxia: Presented with O2 sat in 80s currently satting 94% on 1.4 L -Likely due to underlying causes of RSV infection, viral pneumonia -Continue supportive therapy -IV steroids -with quick taper -Supplemental oxygen, as needed DuoNeb bronchodilators -Mucolytics -As needed Tylenol -Lactic acid 1.3, procalcitonin 0.54 -Initiating empiric antibiotic azithromycin in the face of leukopenia   Controlled type 2 diabetes mellitus without complication, without long-term current use of insulin (HCC) Home medication will be reviewed, it apparently patient has not taken Invokana. -Will check CBG q. ACH, with SSI coverage   GERD -Continue PPI  ESRD (end stage renal disease) (HCC) On hemodialysis Monday Wednesdays and Fridays, - Missed hemodialysis on Wednesday, pursuing with hemodialysis today nephrologist consulted  Anemia in chronic kidney disease -with leukopenia/thrombocytopenia - Pancytopenia with acute on chronic anemia due to chronic kidney disease, monitoring H&H currently stable -Monitor platelets and WBC -Platelets 86>> 68 Acute on chronic leukopenia -Monitoring WBC 3.1>> 2.2 -Discussed, curbside hematologist -no aggressive treatment at this point, monitoring closely    Chronic diarrhea -Chronic diarrhea  noted [facility] medical record reviewed -Initiating as needed Lomotil -No signs of infectious colitis or C. difficile  Anasarca associated with disorder of kidney With end-stage renal disease, missed hemodialysis on Wednesday, nephrologist consulted pursuing hemodialysis today Monitoring closely  Bipolar 1 disorder (HCC) Home medication being reviewed in detail, will resume accordingly once  confirmed As needed anxiolytics-Xanax  HTN (hypertension), malignant - History of hypertension currently not on any BP meds, monitoring closely home medication being reviewed -As needed hydralazine  Left hemiparesis (HCC) Stable, no changes  Hyperlipidemia - Med rec indicate patient is on a statin Lipitor We will reevaluate anticipating to initiate but patient has a history of NASH may be a contraindication     ----------------------------------------------------------------------------------------------------------------------------------------------- Nutritional status:  The patient's BMI is: Body mass index is 42.47 kg/m. I agree with the assessment and plan as -------------------------------------------------------------------------------------------------------------------------------  DVT prophylaxis:  heparin injection 5,000 Units Start: 08/28/23 2200 TED hose Start: 08/28/23 1358 SCDs Start: 08/28/23 1358   Code Status:   Code Status: Limited: Do not attempt resuscitation (DNR) -DNR-LIMITED -Do Not Intubate/DNI   Family Communication: No family member present at bedside--Advance care planning has been discussed.   Admission status:   Status is: Inpatient Remains inpatient appropriate because:continue respiratory support-due to acute respiratory distress due to viral pneumonia, sepsis   Disposition: From  - home             Planning for discharge in 1-2 days: to HOME   Procedures:   No admission procedures for hospital encounter.   Antimicrobials:  Anti-infectives (From admission, onward)    None        Medication:   acidophilus  2 capsule Oral TID WC   benzonatate  200 mg Oral TID   busPIRone  7.5 mg Oral BID   calcium carbonate  1 tablet Oral TID WC   Chlorhexidine Gluconate Cloth  6 each Topical Q0600   cinacalcet  60 mg Oral Q breakfast   diphenoxylate-atropine  1 tablet Oral QID   [START ON 08/31/2023] doxercalciferol  1 mcg Intravenous Q  M,W,F-HD   gabapentin  100 mg Oral QHS   guaiFENesin-dextromethorphan  10 mL Oral Q8H   heparin  5,000 Units Subcutaneous Q8H   insulin glargine-yfgn  30 Units Subcutaneous QHS   latanoprost  1 drop Left Eye BID   methylPREDNISolone (SOLU-MEDROL) injection  40 mg Intravenous Q12H   mupirocin ointment  1 Application Nasal BID   pantoprazole  40 mg Oral Daily   PARoxetine  20 mg Oral QHS   sodium chloride flush  3 mL Intravenous Q12H   sodium chloride flush  3 mL Intravenous Q12H    acetaminophen **OR** acetaminophen, ALPRAZolam, bisacodyl, hydrALAZINE, HYDROcodone-acetaminophen, HYDROmorphone (DILAUDID) injection, ipratropium, lidocaine-prilocaine, ondansetron **OR** ondansetron (ZOFRAN) IV, oxyCODONE, traZODone   Objective:   Vitals:   08/29/23 0800 08/29/23 0802 08/29/23 0900 08/29/23 1000  BP: (!) 154/59  (!) 156/51 (!) 177/65  Pulse: (!) 58 (!) 57 (!) 57 (!) 58  Resp: 16 (!) 22 18 14   Temp:  98.2 F (36.8 C)    TempSrc:  Axillary    SpO2: (!) 87% 92% 95% 100%  Weight:      Height:        Intake/Output Summary (Last 24 hours) at 08/29/2023 1049 Last data filed at 08/28/2023 2146 Gross per 24 hour  Intake 20 ml  Output 2600 ml  Net -2580 ml   Filed Weights   08/28/23 1523 08/28/23 1849 08/29/23 0426  Weight: 124 kg 121.5  kg 123 kg     Physical examination:   Constitution:  Alert, cooperative, no distress,  Appears calm and comfortable   HEENT:   Left eye blindness-prostatic, erythema of the orbit, minimal drainage     normocephalic, PERRL, otherwise with in Normal limits  Chest:         Chest symmetric Cardio vascular:  S1/S2, RRR, No murmure, No Rubs or Gallops  pulmonary: Scattered rhonchi  respirations unlabored, negative wheezes / crackles Abdomen: Soft, non-tender, non-distended, bowel sounds,no masses, no organomegaly Muscular skeletal: Limited exam - in bed, able to move all 4 extremities,   Neuro: CNII-XII intact. , normal motor and sensation, reflexes  intact  Extremities: No pitting edema lower extremities, +2 pulses  Skin: Dry, warm to touch, negative for any Rashes, No open wounds Wounds: per nursing documentation   ------------------------------------------------------------------------------------------------------------------------------------------    LABs:     Latest Ref Rng & Units 08/29/2023    6:05 AM 08/28/2023    9:13 AM 01/06/2023   12:05 PM  CBC  WBC 4.0 - 10.5 K/uL 2.2  3.1  5.9   Hemoglobin 12.0 - 15.0 g/dL 9.2  9.4  84.1   Hematocrit 36.0 - 46.0 % 30.4  30.7  33.1   Platelets 150 - 400 K/uL 68  86  149       Latest Ref Rng & Units 08/29/2023    6:05 AM 08/28/2023    9:13 AM 01/06/2023   12:29 PM  CMP  Glucose 70 - 99 mg/dL 324  401  027   BUN 6 - 20 mg/dL 32  38  40   Creatinine 0.44 - 1.00 mg/dL 2.53  66.44  03.47   Sodium 135 - 145 mmol/L 135  139  138   Potassium 3.5 - 5.1 mmol/L 4.9  4.9  4.2   Chloride 98 - 111 mmol/L 95  99  92   CO2 22 - 32 mmol/L 25  24  23    Calcium 8.9 - 10.3 mg/dL 9.1  8.9  9.7   Total Protein 6.5 - 8.1 g/dL   7.9   Total Bilirubin 0.3 - 1.2 mg/dL   1.1   Alkaline Phos 38 - 126 U/L   109   AST 15 - 41 U/L   7   ALT 0 - 44 U/L   8        Micro Results Recent Results (from the past 240 hours)  Resp panel by RT-PCR (RSV, Flu A&B, Covid) Anterior Nasal Swab     Status: Abnormal   Collection Time: 08/28/23  9:20 AM   Specimen: Anterior Nasal Swab  Result Value Ref Range Status   SARS Coronavirus 2 by RT PCR NEGATIVE NEGATIVE Final    Comment: (NOTE) SARS-CoV-2 target nucleic acids are NOT DETECTED.  The SARS-CoV-2 RNA is generally detectable in upper respiratory specimens during the acute phase of infection. The lowest concentration of SARS-CoV-2 viral copies this assay can detect is 138 copies/mL. A negative result does not preclude SARS-Cov-2 infection and should not be used as the sole basis for treatment or other patient management decisions. A negative result may  occur with  improper specimen collection/handling, submission of specimen other than nasopharyngeal swab, presence of viral mutation(s) within the areas targeted by this assay, and inadequate number of viral copies(<138 copies/mL). A negative result must be combined with clinical observations, patient history, and epidemiological information. The expected result is Negative.  Fact Sheet for Patients:  BloggerCourse.com  Fact Sheet for  Healthcare Providers:  SeriousBroker.it  This test is no t yet approved or cleared by the [place] and  has been authorized for detection and/or diagnosis of SARS-CoV-2 by FDA under an Emergency Use Authorization (EUA). This EUA will remain  in effect (meaning this test can be used) for the duration of the COVID-19 declaration under Section 564(b)(1) of the Act, 21 U.S.C.section 360bbb-3(b)(1), unless the authorization is terminated  or revoked sooner.       Influenza A by PCR NEGATIVE NEGATIVE Final   Influenza B by PCR NEGATIVE NEGATIVE Final    Comment: (NOTE) The Xpert Xpress SARS-CoV-2/FLU/RSV plus assay is intended as an aid in the diagnosis of influenza from Nasopharyngeal swab specimens and should not be used as a sole basis for treatment. Nasal washings and aspirates are unacceptable for Xpert Xpress SARS-CoV-2/FLU/RSV testing.  Fact Sheet for Patients: BloggerCourse.com  Fact Sheet for Healthcare Providers: SeriousBroker.it  This test is not yet approved or cleared by the [place] FDA and has been authorized for detection and/or diagnosis of SARS-CoV-2 by FDA under an Emergency Use Authorization (EUA). This EUA will remain in effect (meaning this test can be used) for the duration of the COVID-19 declaration under Section 564(b)(1) of the Act, 21 U.S.C. section 360bbb-3(b)(1), unless the authorization is terminated  or revoked.     Resp Syncytial Virus by PCR POSITIVE (A) NEGATIVE Final    Comment: (NOTE) Fact Sheet for Patients: BloggerCourse.com  Fact Sheet for Healthcare Providers: SeriousBroker.it  This test is not yet approved or cleared by the [place] FDA and has been authorized for detection and/or diagnosis of SARS-CoV-2 by FDA under an Emergency Use Authorization (EUA). This EUA will remain in effect (meaning this test can be used) for the duration of the COVID-19 declaration under Section 564(b)(1) of the Act, 21 U.S.C. section 360bbb-3(b)(1), unless the authorization is terminated or revoked.  Performed at [facility], [address], [place], [place] [address]   MRSA Next Gen by PCR, Nasal     Status: Abnormal   Collection Time: 08/28/23  2:26 PM   Specimen: Nasal Mucosa; Nasal Swab  Result Value Ref Range Status   MRSA by PCR Next Gen DETECTED (A) NOT DETECTED Final    Comment: RESULT CALLED TO, READ BACK BY AND VERIFIED WITH: BREANNA,RN ON 08/28/23 AT 1913 BY PURDIE,J        The GeneXpert MRSA Assay (FDA approved for NASAL specimens only), is one component of a comprehensive MRSA colonization surveillance program. It is not intended to diagnose MRSA infection nor to guide or monitor treatment for MRSA infections. Performed at [facility], [address], [place], [place] [address]     Radiology Reports No results found.  SIGNED: Kendell Bane, MD, FHM. FAAFP. [facility] - [facility] Critical care time spent - 55 min.  In seeing, evaluating and examining the patient. Reviewing medical records, labs, drawn plan of care. [facility],  Pager (please use [email or web address] to page/ text) Please use Epic Secure Chat for non-urgent communication (7AM-7PM)  If 7PM-7AM, please contact night-coverage [email or web address], 08/29/2023, 10:49 AM

## 2023-08-29 NOTE — Progress Notes (Signed)
Dr.Shahmehdi notified of drainage from left eye prosthetic .

## 2023-08-29 NOTE — Progress Notes (Signed)
Dr.Shahmehdi notified of pt.asking to be placed back on CPAP after being off a few minutes. Charge RN/[facility] also aware due to order to transfer pt.to floor.  MD notified that pt.per Charge/AC unable to transfer to floor unless CPAP is only at night. Pt.verbalizes feeling ''alot of difference in breathing'' when CPAP is off.

## 2023-08-29 NOTE — Progress Notes (Signed)
Dr.Shahmehdi notified of pt's elevated BP

## 2023-08-29 NOTE — Plan of Care (Signed)

## 2023-08-29 NOTE — Progress Notes (Signed)
Brief nephrology note  Patient receives dialysis Monday Wednesday Friday at a [facility] clinic.  Presented with shortness of breath associated with RSV.  Received dialysis yesterday with 2.6 L removed.  Patient states she feels much better today.  Appears fairly euvolemic.  Possibly going home today.  We can consult formally if the patient remains inpatient.  Would have next dialysis on Monday

## 2023-08-29 NOTE — Progress Notes (Signed)
Dr.Shahmehdi notified of pt's WBC dropping from 3.1 to 2.2.

## 2023-08-30 DIAGNOSIS — J210 Acute bronchiolitis due to respiratory syncytial virus: Secondary | ICD-10-CM | POA: Diagnosis not present

## 2023-08-30 LAB — CBC
HCT: 31.4 % — ABNORMAL LOW (ref 36.0–46.0)
Hemoglobin: 9.4 g/dL — ABNORMAL LOW (ref 12.0–15.0)
MCH: 26.8 pg (ref 26.0–34.0)
MCHC: 29.9 g/dL — ABNORMAL LOW (ref 30.0–36.0)
MCV: 89.5 fL (ref 80.0–100.0)
Platelets: 77 10*3/uL — ABNORMAL LOW (ref 150–400)
RBC: 3.51 MIL/uL — ABNORMAL LOW (ref 3.87–5.11)
RDW: 18.9 % — ABNORMAL HIGH (ref 11.5–15.5)
WBC: 3.5 10*3/uL — ABNORMAL LOW (ref 4.0–10.5)
nRBC: 0 % (ref 0.0–0.2)

## 2023-08-30 LAB — BASIC METABOLIC PANEL
Anion gap: 17 — ABNORMAL HIGH (ref 5–15)
BUN: 52 mg/dL — ABNORMAL HIGH (ref 6–20)
CO2: 25 mmol/L (ref 22–32)
Calcium: 8 mg/dL — ABNORMAL LOW (ref 8.9–10.3)
Chloride: 95 mmol/L — ABNORMAL LOW (ref 98–111)
Creatinine, Ser: 8.9 mg/dL — ABNORMAL HIGH (ref 0.44–1.00)
GFR, Estimated: 5 mL/min — ABNORMAL LOW (ref >60)
Glucose, Bld: 150 mg/dL — ABNORMAL HIGH (ref 70–99)
Potassium: 5 mmol/L (ref 3.5–5.1)
Sodium: 137 mmol/L (ref 135–145)

## 2023-08-30 LAB — HEPATITIS B SURFACE ANTIBODY, QUANTITATIVE: Hep B S AB Quant (Post): 8.2 m[IU]/mL — ABNORMAL LOW

## 2023-08-30 LAB — GLUCOSE, CAPILLARY
Glucose-Capillary: 161 mg/dL — ABNORMAL HIGH (ref 70–99)
Glucose-Capillary: 168 mg/dL — ABNORMAL HIGH (ref 70–99)

## 2023-08-30 MED ORDER — BENZONATATE 200 MG PO CAPS: 200.0000 mg | ORAL_CAPSULE | Freq: Three times a day (TID) | ORAL | 0 refills | Status: DC

## 2023-08-30 MED ORDER — GUAIFENESIN-DM 100-10 MG/5ML PO SYRP: 10.0000 mL | ORAL_SOLUTION | Freq: Three times a day (TID) | ORAL | 0 refills | Status: DC

## 2023-08-30 MED ORDER — ZINC OXIDE 40 % EX OINT
TOPICAL_OINTMENT | Freq: Three times a day (TID) | CUTANEOUS | Status: DC
  Filled 2023-08-30: qty 57

## 2023-08-30 MED ORDER — METHYLPREDNISOLONE 4 MG PO TBPK: ORAL_TABLET | ORAL | 0 refills | Status: DC

## 2023-08-30 NOTE — Progress Notes (Signed)
Nsg Discharge Note  Admit Date:  08/28/2023 Discharge date: 08/30/2023   Martha [name] to be D/C'd Home per MD order.  AVS completed.   Patient/caregiver able to verbalize understanding.  Discharge Medication: Allergies as of 08/30/2023       Reactions   Codeine Nausea And Vomiting   Tape Other (See Comments), Rash   Pull skin off.  Paper tape is ok        Medication List     STOP taking these medications    cephALEXin 500 MG capsule Commonly known as: KEFLEX   fluocinonide cream 0.05 % Commonly known as: LIDEX   fluticasone 0.05 % cream Commonly known as: CUTIVATE   hydrOXYzine 25 MG tablet Commonly known as: ATARAX   Invokana 300 MG Tabs tablet Generic drug: canagliflozin   ketoconazole 2 % cream Commonly known as: NIZORAL   leptospermum manuka honey Pste paste   oxyCODONE 5 MG immediate release tablet Commonly known as: Roxicodone   promethazine 25 MG tablet Commonly known as: PHENERGAN       TAKE these medications    Advair Diskus 250-50 MCG/ACT Aepb Generic drug: fluticasone-salmeterol Inhale 1 puff into the lungs 2 (two) times daily.   albuterol (2.5 MG/3ML) 0.083% nebulizer solution Commonly known as: PROVENTIL Take 3 mLs (2.5 mg total) by nebulization every 12 (twelve) hours as needed for wheezing or shortness of breath.   ALPRAZolam 1 MG tablet Commonly known as: XANAX Take 1 tablet (1 mg total) by mouth 4 (four) times daily as needed for anxiety. What changed: when to take this   atorvastatin 20 MG tablet Commonly known as: LIPITOR Take by mouth.   Basaglar KwikPen 100 UNIT/ML Inject 30 Units into the skin at bedtime.   benzonatate 200 MG capsule Commonly known as: TESSALON Take 1 capsule (200 mg total) by mouth 3 (three) times daily.   brimonidine 0.2 % ophthalmic solution Commonly known as: ALPHAGAN PLACE 1 DROP INTO THE RIGHT EYE 3 TIMES A DAY.   busPIRone 7.5 MG tablet Commonly known as: BUSPAR Take 1 tablet (7.5 mg total)  by mouth 2 (two) times daily.   calcium carbonate 750 MG chewable tablet Commonly known as: TUMS EX Chew 1-3 tablets by mouth See admin instructions. Take 3 tablets with each meal & take 1 tablets with each snack   cinacalcet 60 MG tablet Commonly known as: SENSIPAR Take 60 mg by mouth daily.   colchicine 0.6 MG tablet TAKE 1 TABLET(0.6 MG) BY MOUTH DAILY   dextromethorphan 30 MG/5ML liquid Commonly known as: Delsym Take 5 mLs (30 mg total) by mouth 2 (two) times daily as needed for cough.   dorzolamide-timolol 2-0.5 % ophthalmic solution Commonly known as: COSOPT Place 1 drop into the right eye in the morning and at bedtime.   doxercalciferol 4 MCG/2ML injection Commonly known as: HECTOROL Inject 0.5 mLs (1 mcg total) into the vein every Monday, Wednesday, and Friday with hemodialysis.   DRY EYES OP Place 1 drop into the right eye 2 (two) times daily.   FORA V30a Blood Glucose System [employer] premium V10 Test test strip Generic drug: glucose blood   gabapentin 100 MG capsule Commonly known as: NEURONTIN Take 1 capsule (100 mg total) by mouth at bedtime.   guaiFENesin-dextromethorphan 100-10 MG/5ML syrup Commonly known as: ROBITUSSIN DM Take 10 mLs by mouth every 8 (eight) hours.   HYDROcodone-acetaminophen 10-325 MG tablet Commonly known as: NORCO Take 1 tablet by mouth 4 (four) times daily.  insulin aspart 100 UNIT/ML injection Commonly known as: novoLOG Inject 4-7 Units into the skin 3 (three) times daily with meals. Sliding scale insulin   Isopto Atropine 1 % ophthalmic solution Generic drug: atropine Apply to eye.   latanoprost 0.005 % ophthalmic solution Commonly known as: XALATAN Place 1 drop into the right eye 2 (two) times daily.   lidocaine-prilocaine cream Commonly known as: EMLA Apply 1 application topically as needed (topical anesthesia for hemodialysis if Gebauers and Lidocaine injection are ineffective.). What changed:  when to take  this reasons to take this   methylPREDNISolone 4 MG Tbpk tablet Commonly known as: MEDROL DOSEPAK Medrol Dosepak take as instructed   omeprazole 40 MG capsule Commonly known as: PRILOSEC Take 40 mg by mouth daily.   ondansetron 4 MG tablet Commonly known as: ZOFRAN TAKE (1) TABLET THREE TIMES DAILY AS NEEDED FOR NAUSEA.   PARoxetine 20 MG tablet Commonly known as: PAXIL Take 1 tablet (20 mg total) by mouth every evening. This is to prevent panic attacks   Velphoro 500 MG chewable tablet Generic drug: sucroferric oxyhydroxide Chew 1,000 mg by mouth 3 (three) times daily.        Discharge Assessment: Vitals:   08/30/23 0400 08/30/23 0900  BP: (!) 134/59   Pulse: 64   Resp: 16   Temp: 97.8 F (36.6 C)   SpO2: 92% 95%   Skin clean, dry and intact without evidence of skin break down, no evidence of skin tears noted. IV catheter discontinued intact. Site without signs and symptoms of complications - no redness or edema noted at insertion site, patient denies c/o pain - only slight tenderness at site.  Dressing with slight pressure applied.  D/c Instructions-Education: Discharge instructions given to patient/family with verbalized understanding. D/c education completed with patient/family including follow up instructions, medication list, d/c activities limitations if indicated, with other d/c instructions as indicated by MD - patient able to verbalize understanding, all questions fully answered. Patient instructed to return to ED, call 911, or call MD for any changes in condition.  Patient escorted via WC, and D/C home via private auto.  Laurena Spies, RN 08/30/2023 10:11 AM

## 2023-08-30 NOTE — Discharge Summary (Signed)
Physician Discharge Summary   Patient: Martha [name] MRN: [id number] DOB: 1965/09/04  Admit date:     08/28/2023  Discharge date: 08/30/23  Discharge Physician: Kendell Bane   PCP: Carmel Sacramento, NP   Recommendations at discharge:  Follow-up with PCP 1 week Follow-up with your ophthalmologist in 1 week Continue with your scheduled hemodialysis   Discharge Diagnoses: Principal Problem:   RSV (acute bronchiolitis due to respiratory syncytial virus) Active Problems:   Acute respiratory failure with hypoxia (HCC)   Controlled type 2 diabetes mellitus without complication, without long-term current use of insulin (HCC)   GERD   Hyperlipidemia   Essential hypertension   Left hemiparesis (HCC)   HTN (hypertension), malignant   Bipolar 1 disorder (HCC)   Anasarca associated with disorder of kidney   Chronic diarrhea   Anemia in chronic kidney disease   ESRD (end stage renal disease) (HCC)  Resolved Problems:   ESRD (end stage renal disease) on dialysis [facility])  Hospital Course: Martha [name] is a 58 year old female with multiple comorbidities including ESRD on HD MWF, HTN, NASH, COPD, anemia of chronic disease, arthritis, bipolar type I, left eye blindness, radiculopathy, CHF,DDD, DM type II with retinopathy, gout, RLS, panic attacks ....   Presented with missed hemodialysis this past Wednesday, with shortness of breath, cough x 3 days, was found in respiratory distress, mild hypoxia satting 89-92% on room air, with supplemental oxygen satting 94%  ED course/evaluation: Blood pressure (!) 142/106, pulse 74, temperature (!) 100.8 F (38.2 C), temperature source Oral, RR 18, SpO2 94%.  1.5 L  CBC WBC 3.1, hemoglobin 9.4, platelet 86 CMP BUN 38 creatinine 11.5 Glucose 143 BNP 1369.0, lactic acid 1.3 Influenza A, B, SARS-CoV-2 negative RSV positive  Chest x-ray: IMPRESSION: Mild diffuse prominence of the parahilar interstitial markings, suggesting atypical/viral pneumonia or  mild pulmonary edema.     RSV (acute bronchiolitis due to respiratory syncytial virus) Continue supportive measures   Acute respiratory failure with hypoxia (HCC) -On CPAP this morning -Was weaned off supplemental oxygen-currently satting 95% on room air   -POA: Acute respiratory failure, with hypoxia: Presented with O2 sat in 80s currently satting 94% on 1.4 L -Likely due to underlying causes of RSV infection, viral pneumonia -Continue supportive therapy -IV steroids -with quick taper>> switching to p.o. taper -Supplemental oxygen, as needed DuoNeb bronchodilators  -Lactic acid 1.3, procalcitonin 0.54 -Initiating empiric antibiotic azithromycin in the face of leukopenia  -Currently stable, discharged to home with taper down steroids, mucolytics as needed,     Controlled type 2 diabetes mellitus without complication, without long-term current use of insulin (HCC) -Resume home medication, with carb modified diabetic diet     GERD -Continue PPI   ESRD (end stage renal disease) (HCC) On hemodialysis Monday Wednesdays and Fridays, - Missed hemodialysis on Wednesday, pursuing with hemodialysis today nephrologist consulted -Due for dialysis tomorrow Monday, 08/31/2023   Anemia in chronic kidney disease -with leukopenia/thrombocytopenia - Pancytopenia with acute on chronic anemia due to chronic kidney disease, monitoring H&H currently stable -Monitor platelets and WBC -Platelets 86>> 68 Acute on chronic leukopenia -Monitoring WBC 3.1>> 2.2 -Discussed, curbside hematologist -no aggressive treatment at this point, monitoring closely       Chronic diarrhea -Chronic diarrhea noted Tronic medical record reviewed -Initiating as needed Lomotil -No signs of infectious colitis or C. difficile   Anasarca associated with disorder of kidney With end-stage renal disease, missed hemodialysis on Wednesday, nephrologist consulted pursuing hemodialysis today Monitoring closely   Bipolar  1 disorder (HCC) Home medication being reviewed in detail, will resume accordingly once confirmed As needed anxiolytics-Xanax   HTN (hypertension), malignant - History of hypertension currently not on any BP meds, monitoring closely home medication being reviewed -As needed hydralazine   Left hemiparesis (HCC) Stable, no changes   Hyperlipidemia - Med rec indicate patient is on a statin Lipitor We will reevaluate anticipating to initiate but patient has a history of NASH may be a contraindication     Consultants: None Procedures performed: None Disposition: Home Diet recommendation:  Discharge Diet Orders (From admission, onward)     Start     Ordered   08/30/23 0000  Diet - low sodium heart healthy        08/30/23 0948           Cardiac and Carb modified diet DISCHARGE MEDICATION: Allergies as of 08/30/2023       Reactions   Codeine Nausea And Vomiting   Tape Other (See Comments), Rash   Pull skin off.  Paper tape is ok        Medication List     STOP taking these medications    cephALEXin 500 MG capsule Commonly known as: KEFLEX   fluocinonide cream 0.05 % Commonly known as: LIDEX   fluticasone 0.05 % cream Commonly known as: CUTIVATE   hydrOXYzine 25 MG tablet Commonly known as: ATARAX   Invokana 300 MG Tabs tablet Generic drug: canagliflozin   ketoconazole 2 % cream Commonly known as: NIZORAL   leptospermum manuka honey Pste paste   oxyCODONE 5 MG immediate release tablet Commonly known as: Roxicodone   promethazine 25 MG tablet Commonly known as: PHENERGAN       TAKE these medications    Advair Diskus 250-50 MCG/ACT Aepb Generic drug: fluticasone-salmeterol Inhale 1 puff into the lungs 2 (two) times daily.   albuterol (2.5 MG/3ML) 0.083% nebulizer solution Commonly known as: PROVENTIL Take 3 mLs (2.5 mg total) by nebulization every 12 (twelve) hours as needed for wheezing or shortness of breath.   ALPRAZolam 1 MG  tablet Commonly known as: XANAX Take 1 tablet (1 mg total) by mouth 4 (four) times daily as needed for anxiety. What changed: when to take this   atorvastatin 20 MG tablet Commonly known as: LIPITOR Take by mouth.   Basaglar KwikPen 100 UNIT/ML Inject 30 Units into the skin at bedtime.   benzonatate 200 MG capsule Commonly known as: TESSALON Take 1 capsule (200 mg total) by mouth 3 (three) times daily.   brimonidine 0.2 % ophthalmic solution Commonly known as: ALPHAGAN PLACE 1 DROP INTO THE RIGHT EYE 3 TIMES A DAY.   busPIRone 7.5 MG tablet Commonly known as: BUSPAR Take 1 tablet (7.5 mg total) by mouth 2 (two) times daily.   calcium carbonate 750 MG chewable tablet Commonly known as: TUMS EX Chew 1-3 tablets by mouth See admin instructions. Take 3 tablets with each meal & take 1 tablets with each snack   cinacalcet 60 MG tablet Commonly known as: SENSIPAR Take 60 mg by mouth daily.   colchicine 0.6 MG tablet TAKE 1 TABLET(0.6 MG) BY MOUTH DAILY   dextromethorphan 30 MG/5ML liquid Commonly known as: Delsym Take 5 mLs (30 mg total) by mouth 2 (two) times daily as needed for cough.   dorzolamide-timolol 2-0.5 % ophthalmic solution Commonly known as: COSOPT Place 1 drop into the right eye in the morning and at bedtime.   doxercalciferol 4 MCG/2ML injection Commonly known as: HECTOROL Inject 0.5  mLs (1 mcg total) into the vein every Monday, Wednesday, and Friday with hemodialysis.   DRY EYES OP Place 1 drop into the right eye 2 (two) times daily.   FORA V30a Blood Glucose System [employer] premium V10 Test test strip Generic drug: glucose blood   gabapentin 100 MG capsule Commonly known as: NEURONTIN Take 1 capsule (100 mg total) by mouth at bedtime.   guaiFENesin-dextromethorphan 100-10 MG/5ML syrup Commonly known as: ROBITUSSIN DM Take 10 mLs by mouth every 8 (eight) hours.   HYDROcodone-acetaminophen 10-325 MG tablet Commonly known as: NORCO Take 1  tablet by mouth 4 (four) times daily.   insulin aspart 100 UNIT/ML injection Commonly known as: novoLOG Inject 4-7 Units into the skin 3 (three) times daily with meals. Sliding scale insulin   Isopto Atropine 1 % ophthalmic solution Generic drug: atropine Apply to eye.   latanoprost 0.005 % ophthalmic solution Commonly known as: XALATAN Place 1 drop into the right eye 2 (two) times daily.   lidocaine-prilocaine cream Commonly known as: EMLA Apply 1 application topically as needed (topical anesthesia for hemodialysis if Gebauers and Lidocaine injection are ineffective.). What changed:  when to take this reasons to take this   methylPREDNISolone 4 MG Tbpk tablet Commonly known as: MEDROL DOSEPAK Medrol Dosepak take as instructed   omeprazole 40 MG capsule Commonly known as: PRILOSEC Take 40 mg by mouth daily.   ondansetron 4 MG tablet Commonly known as: ZOFRAN TAKE (1) TABLET THREE TIMES DAILY AS NEEDED FOR NAUSEA.   PARoxetine 20 MG tablet Commonly known as: PAXIL Take 1 tablet (20 mg total) by mouth every evening. This is to prevent panic attacks   Velphoro 500 MG chewable tablet Generic drug: sucroferric oxyhydroxide Chew 1,000 mg by mouth 3 (three) times daily.        Discharge Exam: Filed Weights   08/28/23 1849 08/29/23 0426 08/30/23 0419  Weight: 121.5 kg 123 kg 119.7 kg        General:  AAO x 3,  cooperative, no distress;   HEENT:  Left thigh blindness, eye socket mild erythema, with clear discharge  Prostatic in place normocephalic, PERRL, otherwise with in Normal limits   Neuro:  CNII-XII intact. , normal motor and sensation, reflexes intact   Lungs:   Clear to auscultation BL, Respirations unlabored,  No wheezes / crackles  Cardio:    S1/S2, RRR, No murmure, No Rubs or Gallops   Abdomen:  Soft, non-tender, bowel sounds active all four quadrants, no guarding or peritoneal signs.  Muscular  skeletal:  Limited exam -global generalized  weaknesses - in bed, able to move all 4 extremities,   2+ pulses,  symmetric, No pitting edema  Skin:  Dry, warm to touch, negative for any Rashes,  Wounds: Please see nursing documentation          Condition at discharge: good  The results of significant diagnostics from this hospitalization (including imaging, microbiology, ancillary and laboratory) are listed below for reference.   Imaging Studies: DG Chest Port 1 View Result Date: 08/28/2023 CLINICAL DATA:  SOB, hypoxia, COPD EXAM: PORTABLE CHEST 1 VIEW COMPARISON:  11/03/2022 chest radiograph. FINDINGS: Stable cardiomediastinal silhouette with top normal heart size. No pneumothorax. No pleural effusion. Mild diffuse prominence of the parahilar interstitial markings. IMPRESSION: Mild diffuse prominence of the parahilar interstitial markings, suggesting atypical/viral pneumonia or mild pulmonary edema. Electronically Signed   By: Delbert Phenix M.D.   On: 08/28/2023 09:10    Microbiology: Results for orders  placed or performed during the hospital encounter of 08/28/23  Resp panel by RT-PCR (RSV, Flu A&B, Covid) Anterior Nasal Swab     Status: Abnormal   Collection Time: 08/28/23  9:20 AM   Specimen: Anterior Nasal Swab  Result Value Ref Range Status   SARS Coronavirus 2 by RT PCR NEGATIVE NEGATIVE Final    Comment: (NOTE) SARS-CoV-2 target nucleic acids are NOT DETECTED.  The SARS-CoV-2 RNA is generally detectable in upper respiratory specimens during the acute phase of infection. The lowest concentration of SARS-CoV-2 viral copies this assay can detect is 138 copies/mL. A negative result does not preclude SARS-Cov-2 infection and should not be used as the sole basis for treatment or other patient management decisions. A negative result may occur with  improper specimen collection/handling, submission of specimen other than nasopharyngeal swab, presence of viral mutation(s) within the areas targeted by this assay, and  inadequate number of viral copies(<138 copies/mL). A negative result must be combined with clinical observations, patient history, and epidemiological information. The expected result is Negative.  Fact Sheet for Patients:  BloggerCourse.com  Fact Sheet for Healthcare Providers:  SeriousBroker.it  This test is no t yet approved or cleared by the [place] FDA and  has been authorized for detection and/or diagnosis of SARS-CoV-2 by FDA under an Emergency Use Authorization (EUA). This EUA will remain  in effect (meaning this test can be used) for the duration of the COVID-19 declaration under Section 564(b)(1) of the Act, 21 U.S.C.section 360bbb-3(b)(1), unless the authorization is terminated  or revoked sooner.       Influenza A by PCR NEGATIVE NEGATIVE Final   Influenza B by PCR NEGATIVE NEGATIVE Final    Comment: (NOTE) The Xpert Xpress SARS-CoV-2/FLU/RSV plus assay is intended as an aid in the diagnosis of influenza from Nasopharyngeal swab specimens and should not be used as a sole basis for treatment. Nasal washings and aspirates are unacceptable for Xpert Xpress SARS-CoV-2/FLU/RSV testing.  Fact Sheet for Patients: BloggerCourse.com  Fact Sheet for Healthcare Providers: SeriousBroker.it  This test is not yet approved or cleared by the [place] FDA and has been authorized for detection and/or diagnosis of SARS-CoV-2 by FDA under an Emergency Use Authorization (EUA). This EUA will remain in effect (meaning this test can be used) for the duration of the COVID-19 declaration under Section 564(b)(1) of the Act, 21 U.S.C. section 360bbb-3(b)(1), unless the authorization is terminated or revoked.     Resp Syncytial Virus by PCR POSITIVE (A) NEGATIVE Final    Comment: (NOTE) Fact Sheet for Patients: BloggerCourse.com  Fact Sheet for  Healthcare Providers: SeriousBroker.it  This test is not yet approved or cleared by the [place] FDA and has been authorized for detection and/or diagnosis of SARS-CoV-2 by FDA under an Emergency Use Authorization (EUA). This EUA will remain in effect (meaning this test can be used) for the duration of the COVID-19 declaration under Section 564(b)(1) of the Act, 21 U.S.C. section 360bbb-3(b)(1), unless the authorization is terminated or revoked.  Performed at [facility], [address] [place], [place] [address]   MRSA Next Gen by PCR, Nasal     Status: Abnormal   Collection Time: 08/28/23  2:26 PM   Specimen: Nasal Mucosa; Nasal Swab  Result Value Ref Range Status   MRSA by PCR Next Gen DETECTED (A) NOT DETECTED Final    Comment: RESULT CALLED TO, READ BACK BY AND VERIFIED WITH: BREANNA,RN ON 08/28/23 AT 1913 BY PURDIE,J  The GeneXpert MRSA Assay (FDA approved for NASAL specimens only), is one component of a comprehensive MRSA colonization surveillance program. It is not intended to diagnose MRSA infection nor to guide or monitor treatment for MRSA infections. Performed at [facility], [address] [place], [place] [address]   Expectorated Sputum Assessment w Gram Stain, Rflx to Resp Cult     Status: None   Collection Time: 08/29/23  3:23 PM   Specimen: Sputum  Result Value Ref Range Status   Specimen Description SPUTUM  Final   Special Requests NONE  Final   Sputum evaluation   Final    THIS SPECIMEN IS ACCEPTABLE FOR SPUTUM CULTURE Performed at [facility], [address] [place], [place] [address]    Report Status 08/29/2023 FINAL  Final  Culture, Respiratory w Gram Stain     Status: None (Preliminary result)   Collection Time: 08/29/23  3:23 PM   Specimen: SPU  Result Value Ref Range Status   Specimen Description   Final    SPUTUM Performed at [facility], [address], [place], [place] [address]    Special Requests    Final    NONE Reflexed from [id number] Performed at [facility], [address], [place], [place] [address]    Gram Stain   Final    MODERATE WBC PRESENT,BOTH PMN AND MONONUCLEAR FEW GRAM POSITIVE COCCI IN CLUSTERS RARE GRAM NEGATIVE RODS FEW GRAM POSITIVE COCCI IN PAIRS AND CHAINS Performed at [facility], [address] [place], [place] [address]    Culture PENDING  Incomplete   Report Status PENDING  Incomplete    Labs: CBC: Recent Labs  Lab 08/28/23 0913 08/29/23 0605 08/30/23 0425  WBC 3.1* 2.2* 3.5*  NEUTROABS 2.4  --   --   HGB 9.4* 9.2* 9.4*  HCT 30.7* 30.4* 31.4*  MCV 91.1 89.4 89.5  PLT 86* 68* 77*   Basic Metabolic Panel: Recent Labs  Lab 08/28/23 0913 08/29/23 0605 08/30/23 0425  NA 139 135 137  K 4.9 4.9 5.0  CL 99 95* 95*  CO2 24 25 25   GLUCOSE 143* 176* 150*  BUN 38* 32* 52*  CREATININE 11.51* 7.59* 8.90*  CALCIUM 8.9 9.1 8.0*  MG 1.9  --   --   PHOS 6.4*  --   --    Liver Function Tests: No results for input(s): "AST", "ALT", "ALKPHOS", "BILITOT", "PROT", "ALBUMIN" in the last 168 hours. CBG: Recent Labs  Lab 08/28/23 2203 08/29/23 0732 08/30/23 0717  GLUCAP 182* 178* 161*    Discharge time spent: greater than 45 minutes.  Signed: Kendell Bane, MD [facility] 08/30/2023

## 2023-08-30 NOTE — Plan of Care (Signed)
   Problem: Health Behavior/Discharge Planning: Goal: Ability to manage health-related needs will improve Outcome: Progressing   Problem: Clinical Measurements: Goal: Ability to maintain clinical measurements within normal limits will improve Outcome: Progressing Goal: Will remain free from infection Outcome: Progressing Goal: Diagnostic test results will improve Outcome: Progressing Goal: Respiratory complications will improve Outcome: Progressing

## 2023-09-01 LAB — CULTURE, RESPIRATORY W GRAM STAIN: Culture: NORMAL

## 2023-09-21 ENCOUNTER — Emergency Department (HOSPITAL_COMMUNITY): Payer: Medicaid Other

## 2023-09-21 ENCOUNTER — Observation Stay (HOSPITAL_COMMUNITY)
Admission: EM | Admit: 2023-09-21 | Discharge: 2023-09-22 | DRG: 814 | Discharge status: Against Medical Advice | Payer: Medicaid Other | Attending: Internal Medicine | Admitting: Internal Medicine

## 2023-09-21 ENCOUNTER — Ambulatory Visit: Payer: Self-pay

## 2023-09-21 ENCOUNTER — Other Ambulatory Visit: Payer: Self-pay

## 2023-09-21 ENCOUNTER — Encounter (HOSPITAL_COMMUNITY): Payer: Self-pay | Admitting: Emergency Medicine

## 2023-09-21 DIAGNOSIS — E11319 Type 2 diabetes mellitus with unspecified diabetic retinopathy without macular edema: Secondary | ICD-10-CM | POA: Insufficient documentation

## 2023-09-21 DIAGNOSIS — J449 Chronic obstructive pulmonary disease, unspecified: Secondary | ICD-10-CM | POA: Diagnosis present

## 2023-09-21 DIAGNOSIS — R0682 Tachypnea, not elsewhere classified: Secondary | ICD-10-CM | POA: Diagnosis present

## 2023-09-21 DIAGNOSIS — H409 Unspecified glaucoma: Secondary | ICD-10-CM | POA: Diagnosis present

## 2023-09-21 DIAGNOSIS — H5462 Unqualified visual loss, left eye, normal vision right eye: Secondary | ICD-10-CM | POA: Diagnosis not present

## 2023-09-21 DIAGNOSIS — F17290 Nicotine dependence, other tobacco product, uncomplicated: Secondary | ICD-10-CM | POA: Diagnosis not present

## 2023-09-21 DIAGNOSIS — F32A Depression, unspecified: Secondary | ICD-10-CM | POA: Diagnosis not present

## 2023-09-21 DIAGNOSIS — D735 Infarction of spleen: Secondary | ICD-10-CM | POA: Diagnosis present

## 2023-09-21 DIAGNOSIS — E785 Hyperlipidemia, unspecified: Secondary | ICD-10-CM | POA: Diagnosis not present

## 2023-09-21 DIAGNOSIS — F17210 Nicotine dependence, cigarettes, uncomplicated: Secondary | ICD-10-CM | POA: Diagnosis not present

## 2023-09-21 DIAGNOSIS — K529 Noninfective gastroenteritis and colitis, unspecified: Secondary | ICD-10-CM | POA: Diagnosis present

## 2023-09-21 DIAGNOSIS — I10 Essential (primary) hypertension: Secondary | ICD-10-CM | POA: Diagnosis present

## 2023-09-21 DIAGNOSIS — I700 Atherosclerosis of aorta: Secondary | ICD-10-CM | POA: Diagnosis not present

## 2023-09-21 DIAGNOSIS — F419 Anxiety disorder, unspecified: Secondary | ICD-10-CM | POA: Diagnosis not present

## 2023-09-21 DIAGNOSIS — F418 Other specified anxiety disorders: Secondary | ICD-10-CM | POA: Diagnosis present

## 2023-09-21 DIAGNOSIS — I132 Hypertensive heart and chronic kidney disease with heart failure and with stage 5 chronic kidney disease, or end stage renal disease: Secondary | ICD-10-CM | POA: Diagnosis not present

## 2023-09-21 DIAGNOSIS — Z992 Dependence on renal dialysis: Secondary | ICD-10-CM | POA: Diagnosis not present

## 2023-09-21 DIAGNOSIS — Z79899 Other long term (current) drug therapy: Secondary | ICD-10-CM

## 2023-09-21 DIAGNOSIS — L299 Pruritus, unspecified: Secondary | ICD-10-CM | POA: Diagnosis present

## 2023-09-21 DIAGNOSIS — Z1152 Encounter for screening for COVID-19: Secondary | ICD-10-CM

## 2023-09-21 DIAGNOSIS — Z885 Allergy status to narcotic agent status: Secondary | ICD-10-CM

## 2023-09-21 DIAGNOSIS — Z5329 Procedure and treatment not carried out because of patient's decision for other reasons: Secondary | ICD-10-CM | POA: Diagnosis present

## 2023-09-21 DIAGNOSIS — E782 Mixed hyperlipidemia: Secondary | ICD-10-CM | POA: Diagnosis present

## 2023-09-21 DIAGNOSIS — G4733 Obstructive sleep apnea (adult) (pediatric): Secondary | ICD-10-CM | POA: Diagnosis present

## 2023-09-21 DIAGNOSIS — R197 Diarrhea, unspecified: Secondary | ICD-10-CM | POA: Diagnosis not present

## 2023-09-21 DIAGNOSIS — I509 Heart failure, unspecified: Secondary | ICD-10-CM | POA: Diagnosis not present

## 2023-09-21 DIAGNOSIS — K7581 Nonalcoholic steatohepatitis (NASH): Secondary | ICD-10-CM | POA: Diagnosis not present

## 2023-09-21 DIAGNOSIS — E1122 Type 2 diabetes mellitus with diabetic chronic kidney disease: Secondary | ICD-10-CM | POA: Diagnosis present

## 2023-09-21 DIAGNOSIS — N2581 Secondary hyperparathyroidism of renal origin: Secondary | ICD-10-CM | POA: Diagnosis present

## 2023-09-21 DIAGNOSIS — F319 Bipolar disorder, unspecified: Secondary | ICD-10-CM | POA: Diagnosis present

## 2023-09-21 DIAGNOSIS — R112 Nausea with vomiting, unspecified: Secondary | ICD-10-CM | POA: Insufficient documentation

## 2023-09-21 DIAGNOSIS — Z9001 Acquired absence of eye: Secondary | ICD-10-CM

## 2023-09-21 DIAGNOSIS — R109 Unspecified abdominal pain: Secondary | ICD-10-CM | POA: Insufficient documentation

## 2023-09-21 DIAGNOSIS — D61818 Other pancytopenia: Secondary | ICD-10-CM | POA: Diagnosis present

## 2023-09-21 DIAGNOSIS — I120 Hypertensive chronic kidney disease with stage 5 chronic kidney disease or end stage renal disease: Secondary | ICD-10-CM | POA: Diagnosis present

## 2023-09-21 DIAGNOSIS — Z794 Long term (current) use of insulin: Secondary | ICD-10-CM

## 2023-09-21 DIAGNOSIS — Z7951 Long term (current) use of inhaled steroids: Secondary | ICD-10-CM

## 2023-09-21 DIAGNOSIS — E1140 Type 2 diabetes mellitus with diabetic neuropathy, unspecified: Secondary | ICD-10-CM | POA: Diagnosis present

## 2023-09-21 DIAGNOSIS — K219 Gastro-esophageal reflux disease without esophagitis: Secondary | ICD-10-CM | POA: Diagnosis not present

## 2023-09-21 DIAGNOSIS — Z91048 Other nonmedicinal substance allergy status: Secondary | ICD-10-CM

## 2023-09-21 DIAGNOSIS — N186 End stage renal disease: Secondary | ICD-10-CM | POA: Diagnosis not present

## 2023-09-21 DIAGNOSIS — G2581 Restless legs syndrome: Secondary | ICD-10-CM | POA: Diagnosis present

## 2023-09-21 DIAGNOSIS — R0602 Shortness of breath: Secondary | ICD-10-CM | POA: Insufficient documentation

## 2023-09-21 DIAGNOSIS — K7460 Unspecified cirrhosis of liver: Secondary | ICD-10-CM | POA: Diagnosis present

## 2023-09-21 DIAGNOSIS — M109 Gout, unspecified: Secondary | ICD-10-CM | POA: Diagnosis present

## 2023-09-21 LAB — CBC
HCT: 33.1 % — ABNORMAL LOW (ref 36.0–46.0)
Hemoglobin: 9.4 g/dL — ABNORMAL LOW (ref 12.0–15.0)
MCH: 27.6 pg (ref 26.0–34.0)
MCHC: 28.4 g/dL — ABNORMAL LOW (ref 30.0–36.0)
MCV: 97.4 fL (ref 80.0–100.0)
Platelets: 91 10*3/uL — ABNORMAL LOW (ref 150–400)
RBC: 3.4 MIL/uL — ABNORMAL LOW (ref 3.87–5.11)
RDW: 21 % — ABNORMAL HIGH (ref 11.5–15.5)
WBC: 2.4 10*3/uL — ABNORMAL LOW (ref 4.0–10.5)
nRBC: 0 % (ref 0.0–0.2)

## 2023-09-21 LAB — COMPREHENSIVE METABOLIC PANEL
ALT: 8 U/L (ref 0–44)
AST: 13 U/L — ABNORMAL LOW (ref 15–41)
Albumin: 3.8 g/dL (ref 3.5–5.0)
Alkaline Phosphatase: 108 U/L (ref 38–126)
Anion gap: 22 — ABNORMAL HIGH (ref 5–15)
BUN: 32 mg/dL — ABNORMAL HIGH (ref 6–20)
CO2: 19 mmol/L — ABNORMAL LOW (ref 22–32)
Calcium: 9.7 mg/dL (ref 8.9–10.3)
Chloride: 102 mmol/L (ref 98–111)
Creatinine, Ser: 10.64 mg/dL — ABNORMAL HIGH (ref 0.44–1.00)
GFR, Estimated: 4 mL/min — ABNORMAL LOW (ref >60)
Glucose, Bld: 98 mg/dL (ref 70–99)
Potassium: 4.9 mmol/L (ref 3.5–5.1)
Sodium: 143 mmol/L (ref 135–145)
Total Bilirubin: 1.5 mg/dL — ABNORMAL HIGH (ref 0.0–1.2)
Total Protein: 7.4 g/dL (ref 6.5–8.1)

## 2023-09-21 LAB — BLOOD GAS, VENOUS
Acid-Base Excess: 5.3 mmol/L — ABNORMAL HIGH (ref 0.0–2.0)
Bicarbonate: 29 mmol/L — ABNORMAL HIGH (ref 20.0–28.0)
Drawn by: 66297
O2 Saturation: 59.6 %
Patient temperature: 37.1
pCO2, Ven: 38 mm[Hg] — ABNORMAL LOW (ref 44–60)
pH, Ven: 7.49 — ABNORMAL HIGH (ref 7.25–7.43)
pO2, Ven: 36 mm[Hg] (ref 32–45)

## 2023-09-21 LAB — MAGNESIUM: Magnesium: 1.9 mg/dL (ref 1.7–2.4)

## 2023-09-21 LAB — RESP PANEL BY RT-PCR (RSV, FLU A&B, COVID)  RVPGX2
Influenza A by PCR: NEGATIVE
Influenza B by PCR: NEGATIVE
Resp Syncytial Virus by PCR: NEGATIVE
SARS Coronavirus 2 by RT PCR: NEGATIVE

## 2023-09-21 LAB — PHOSPHORUS: Phosphorus: 6.9 mg/dL — ABNORMAL HIGH (ref 2.5–4.6)

## 2023-09-21 MED ORDER — HEPARIN (PORCINE) 25000 UT/250ML-% IV SOLN
1350.0000 [IU]/h | INTRAVENOUS | Status: DC
  Administered 2023-09-22: 1350 [IU]/h via INTRAVENOUS
  Filled 2023-09-21: qty 250

## 2023-09-21 MED ORDER — IOHEXOL 300 MG/ML  SOLN
100.0000 mL | Freq: Once | INTRAMUSCULAR | Status: AC | PRN
  Administered 2023-09-21: 100 mL via INTRAVENOUS

## 2023-09-21 MED ORDER — FENTANYL CITRATE PF 50 MCG/ML IJ SOSY
50.0000 ug | PREFILLED_SYRINGE | Freq: Once | INTRAMUSCULAR | Status: AC
  Administered 2023-09-21: 50 ug via INTRAVENOUS
  Filled 2023-09-21: qty 1

## 2023-09-21 MED ORDER — HEPARIN BOLUS VIA INFUSION
4000.0000 [IU] | Freq: Once | INTRAVENOUS | Status: AC
  Administered 2023-09-22: 4000 [IU] via INTRAVENOUS

## 2023-09-21 MED ORDER — ONDANSETRON HCL 4 MG/2ML IJ SOLN
4.0000 mg | Freq: Once | INTRAMUSCULAR | Status: AC
  Administered 2023-09-21: 4 mg via INTRAVENOUS
  Filled 2023-09-21: qty 2

## 2023-09-21 MED ORDER — CHLORHEXIDINE GLUCONATE CLOTH 2 % EX PADS: 6.0000 | MEDICATED_PAD | Freq: Every day | CUTANEOUS | Status: DC

## 2023-09-21 MED ORDER — LORAZEPAM 2 MG/ML IJ SOLN
0.5000 mg | Freq: Once | INTRAMUSCULAR | Status: AC
  Administered 2023-09-21: 0.5 mg via INTRAVENOUS
  Filled 2023-09-21: qty 1

## 2023-09-21 MED ORDER — ALPRAZOLAM 0.5 MG PO TABS
1.0000 mg | ORAL_TABLET | Freq: Once | ORAL | Status: AC
  Administered 2023-09-21: 1 mg via ORAL
  Filled 2023-09-21: qty 2

## 2023-09-21 NOTE — ED Triage Notes (Signed)
 BIB EMS from home.  N/v/d x 1 month. Missed dialysis once last week and again today d/t symptoms.  Has not been able to take medications d/t vomiting.  CBG 122, 121/65, HR 66, 98% on RA

## 2023-09-21 NOTE — ED Notes (Signed)
 Pt says she feels less anxious

## 2023-09-21 NOTE — ED Notes (Signed)
 Pt vomiting bile contents, pt also having diarrhea. Dr Rhunette Croft made aware.

## 2023-09-21 NOTE — ED Provider Notes (Signed)
 [place] EMERGENCY DEPARTMENT AT [facility] Provider Note   CSN: [id number] Arrival date & time: 09/21/23  1654     History {Add pertinent medical, surgical, social history, OB history to HPI:1} Chief Complaint  Patient presents with   Emesis    Martha [name] is a 58 y.o. female.  HPI    58 year old female with history of COPD, Elita Boone, hypertension, ESRD on hemodialysis M-W-F, diabetes, CHF comes in with chief complaint of shortness of breath.  Patient also has anxiety and takes Xanax for it.  Patient states that she has had intermittent episodes of nausea, vomiting, diarrhea over the last several days.  However in the last 5 days, her symptoms have been more pronounced.  She is also having lower quadrant abdominal pain.  Patient has 5-6 episodes of emesis and diarrhea.  She is blind, therefore unable to tell if there is any blood, yellow/green color to the emesis.  Pain is in the lower quadrant and now constant.  Patient states that she is unable to keep any medications down, including Xanax.  She feels like she is having a panic attack right now.  Patient had her dialysis on Friday.  Today, she did not get her dialysis due to her symptoms.  She states that the dialysis center told her that with vomiting it is not advisable for her to come to dialysis.  Home Medications Prior to Admission medications   Medication Sig Start Date End Date Taking? Authorizing Provider  ADVAIR DISKUS 250-50 MCG/ACT AEPB Inhale 1 puff into the lungs 2 (two) times daily. Patient not taking: Reported on 08/28/2023 07/01/22   [provider]  albuterol (PROVENTIL) (2.5 MG/3ML) 0.083% nebulizer solution Take 3 mLs (2.5 mg total) by nebulization every 12 (twelve) hours as needed for wheezing or shortness of breath. 08/05/22   Valentino Nose, NP  ALPRAZolam Prudy Feeler) 1 MG tablet Take 1 tablet (1 mg total) by mouth 4 (four) times daily as needed for anxiety. Patient taking differently:  Take 1 mg by mouth 4 (four) times daily. 07/12/20   Rhetta Mura, MD  Artificial Tear Ointment (DRY EYES OP) Place 1 drop into the right eye 2 (two) times daily.    [provider]  atorvastatin (LIPITOR) 20 MG tablet Take by mouth. 10/21/18   [provider]  atropine (ISOPTO ATROPINE) 1 % ophthalmic solution Apply to eye. 12/27/19   [provider]  benzonatate (TESSALON) 200 MG capsule Take 1 capsule (200 mg total) by mouth 3 (three) times daily. 08/30/23   Kendell Bane, MD  Blood Glucose Monitoring Suppl (FORA V30A BLOOD GLUCOSE SYSTEM) DEVI  09/22/20   [provider]  brimonidine (ALPHAGAN) 0.2 % ophthalmic solution PLACE 1 DROP INTO THE RIGHT EYE 3 TIMES A DAY. 05/24/21   [provider]  busPIRone (BUSPAR) 7.5 MG tablet Take 1 tablet (7.5 mg total) by mouth 2 (two) times daily. 07/12/20   Rhetta Mura, MD  calcium carbonate (TUMS EX) 750 MG chewable tablet Chew 1-3 tablets by mouth See admin instructions. Take 3 tablets with each meal & take 1 tablets with each snack    [provider]  cinacalcet (SENSIPAR) 60 MG tablet Take 60 mg by mouth daily.    [provider]  colchicine 0.6 MG tablet TAKE 1 TABLET(0.6 MG) BY MOUTH DAILY 06/04/23   Felecia Shelling, DPM  dextromethorphan (DELSYM) 30 MG/5ML liquid Take 5 mLs (30 mg total) by mouth 2 (two) times daily as needed  for cough. 11/05/22   Johnson, Clanford L, MD  dorzolamide-timolol (COSOPT) 22.3-6.8 MG/ML ophthalmic solution Place 1 drop into the right eye in the morning and at bedtime. 06/01/20   [provider]  doxercalciferol (HECTOROL) 4 MCG/2ML injection Inject 0.5 mLs (1 mcg total) into the vein every Monday, Wednesday, and Friday with hemodialysis. 12/03/18   Kari Baars, MD  FORACARE PREMIUM V10 TEST test strip  09/22/20   [provider]  gabapentin (NEURONTIN) 100 MG capsule Take 1 capsule (100 mg total) by mouth at bedtime. Patient taking  differently: Take 300 mg by mouth at bedtime. 07/12/20   Rhetta Mura, MD  guaiFENesin-dextromethorphan (ROBITUSSIN DM) 100-10 MG/5ML syrup Take 10 mLs by mouth every 8 (eight) hours. 08/30/23   ShahmehdiGemma Payor, MD  HYDROcodone-acetaminophen (NORCO) 10-325 MG tablet Take 1 tablet by mouth 4 (four) times daily. 08/30/20   [provider]  insulin aspart (NOVOLOG) 100 UNIT/ML injection Inject 4-7 Units into the skin 3 (three) times daily with meals. Sliding scale insulin 11/05/22   Johnson, Clanford L, MD  Insulin Glargine (BASAGLAR KWIKPEN) 100 UNIT/ML Inject 30 Units into the skin at bedtime. 07/12/20   Rhetta Mura, MD  latanoprost (XALATAN) 0.005 % ophthalmic solution Place 1 drop into the right eye 2 (two) times daily. 08/22/20   [provider]  lidocaine-prilocaine (EMLA) cream Apply 1 application topically as needed (topical anesthesia for hemodialysis if Gebauers and Lidocaine injection are ineffective.). Patient taking differently: Apply 1 application  topically daily as needed (dialysis.). 04/25/17   Johnson, Clanford L, MD  methylPREDNISolone (MEDROL DOSEPAK) 4 MG TBPK tablet Medrol Dosepak take as instructed 08/30/23   Shahmehdi, Guadalupe Maple A, MD  omeprazole (PRILOSEC) 40 MG capsule Take 40 mg by mouth daily.    [provider]  ondansetron (ZOFRAN) 4 MG tablet TAKE (1) TABLET THREE TIMES DAILY AS NEEDED FOR NAUSEA. 07/30/22   [provider]  PARoxetine (PAXIL) 20 MG tablet Take 1 tablet (20 mg total) by mouth every evening. This is to prevent panic attacks Patient taking differently: Take 20 mg by mouth at bedtime. 07/12/20   Rhetta Mura, MD  VELPHORO 500 MG chewable tablet Chew 1,000 mg by mouth 3 (three) times daily. 08/05/23   [provider]      Allergies    Codeine and Tape    Review of Systems   Review of Systems  All other systems reviewed and are negative.   Physical Exam Updated Vital Signs BP (!) 158/66   Pulse  64   Resp 16   LMP 08/03/2008 (Exact Date)   SpO2 97%  Physical Exam Vitals and nursing note reviewed.  Constitutional:      Appearance: She is well-developed.  HENT:     Head: Atraumatic.  Cardiovascular:     Rate and Rhythm: Normal rate.  Pulmonary:     Effort: Pulmonary effort is normal.     Comments: Tachypnea noted Abdominal:     Tenderness: There is abdominal tenderness. There is no guarding or rebound.     Comments: Lower quadrant abdominal tenderness noted  Musculoskeletal:     Cervical back: Neck supple.  Skin:    General: Skin is warm and dry.  Neurological:     Mental Status: She is alert and oriented to person, place, and time.     ED Results / Procedures / Treatments   Labs (all labs ordered are listed, but only abnormal results are displayed) Labs Reviewed  COMPREHENSIVE METABOLIC PANEL - Abnormal;  Notable for the following components:      Result Value   CO2 19 (*)    BUN 32 (*)    Creatinine, Ser 10.64 (*)    AST 13 (*)    Total Bilirubin 1.5 (*)    GFR, Estimated 4 (*)    Anion gap 22 (*)    All other components within normal limits  CBC - Abnormal; Notable for the following components:   WBC 2.4 (*)    RBC 3.40 (*)    Hemoglobin 9.4 (*)    HCT 33.1 (*)    MCHC 28.4 (*)    RDW 21.0 (*)    Platelets 91 (*)    All other components within normal limits  PHOSPHORUS - Abnormal; Notable for the following components:   Phosphorus 6.9 (*)    All other components within normal limits  BLOOD GAS, VENOUS - Abnormal; Notable for the following components:   pH, Ven 7.49 (*)    pCO2, Ven 38 (*)    Bicarbonate 29.0 (*)    Acid-Base Excess 5.3 (*)    All other components within normal limits  RESP PANEL BY RT-PCR (RSV, FLU A&B, COVID)  RVPGX2  MAGNESIUM  URINALYSIS, ROUTINE W REFLEX MICROSCOPIC  HEPATITIS B SURFACE ANTIGEN  HEPATITIS B SURFACE ANTIBODY, QUANTITATIVE    EKG EKG Interpretation Date/Time:  Monday September 21 2023 19:04:23  EST Ventricular Rate:  63 PR Interval:  160 QRS Duration:  94 QT Interval:  466 QTC Calculation: 478 R Axis:   45  Text Interpretation: Sinus rhythm Low voltage, precordial leads No acute changes No significant change since last tracing Confirmed by Derwood Kaplan ([id number]) on 09/21/2023 7:36:12 PM  Radiology DG Chest Port 1 View Result Date: 09/21/2023 CLINICAL DATA:  Evaluate for fluid overload. Missed dialysis. Nausea, vomiting, diarrhea. EXAM: PORTABLE CHEST 1 VIEW COMPARISON:  08/28/2023 FINDINGS: Heart is borderline in size. Mediastinal contours within normal limits. Aortic atherosclerosis. Interstitial prominence within the lungs, similar to prior study. No effusions or acute bony abnormality. IMPRESSION: Diffuse interstitial prominence similar to prior study and could reflect edema or atypical infection. Electronically Signed   By: Charlett Nose M.D.   On: 09/21/2023 20:13    Procedures Procedures  {Document cardiac monitor, telemetry assessment procedure when appropriate:1}  Medications Ordered in ED Medications  Chlorhexidine Gluconate Cloth 2 % PADS 6 each (has no administration in time range)  fentaNYL (SUBLIMAZE) injection 50 mcg (50 mcg Intravenous Given 09/21/23 1817)  ondansetron (ZOFRAN) injection 4 mg (4 mg Intravenous Given 09/21/23 1817)  LORazepam (ATIVAN) injection 0.5 mg (0.5 mg Intravenous Given 09/21/23 1817)  ALPRAZolam (XANAX) tablet 1 mg (1 mg Oral Given 09/21/23 2006)    ED Course/ Medical Decision Making/ A&P   {   Click here for ABCD2, HEART and other calculatorsREFRESH Note before signing :1}                              Medical Decision Making Amount and/or Complexity of Data Reviewed Labs: ordered. Radiology: ordered.  Risk Prescription drug management.   This patient presents to the ED with chief complaint(s) of nausea, vomiting, abdominal pain and " panic attack feeling" with pertinent past medical history of ESRD on hemodialysis, diabetes  chronic pain syndrome, chronic anxiety for which patient is on high-dose Percocet and Xanax.The complaint involves an extensive differential diagnosis and also carries with it a high risk of complications and morbidity.  The differential diagnosis includes : Acute diverticulitis, acute cystitis, SBO, colitis, severe electrolyte abnormality, pulmonary edema, withdrawal symptoms.  The initial plan is to get basic labs.  We will give patient some IV pain medication and IV Ativan while we are initiating workup.  Additional history obtained: Records reviewed previous admission documents  Independent labs interpretation:  The following labs were independently interpreted: Patient has leukopenia.  Hemoglobin is at baseline normal for the patient.  Her bicarb is 19, anion gap is 22.  Likely because of missed dialysis.  Low suspicion for lactic acidosis.  Other possibility also includes dehydration and starvation ketosis.  COVID, flu test is negative.  Independent visualization and interpretation of imaging: - I independently visualized the following imaging with scope of interpretation limited to determining acute life threatening conditions related to emergency care: X-ray of the chest, which revealed no focal infiltrates.  Patient does have findings concerning for possible bronchiolitis.  Other possibility includes edema.   Treatment and Reassessment: Patient reassessed.  She states that the Ativan did not help a lot.  She does indicate that she has been throwing up everything including Xanax.  Her exam reveals no significant rales.  She is tachypneic.  My suspicion is still that patient likely is having withdrawals.  Blood gas has been ordered.  Volume overload is still a possibility, but does not appear to be the main reason.  I will consult nephrology as well.  Consultation: - Consulted or discussed management/test interpretation with external professional: ***  Consideration for admission  or further workup:  Social Determinants of health:   Final Clinical Impression(s) / ED Diagnoses Final diagnoses:  None    Rx / DC Orders ED Discharge Orders     None

## 2023-09-21 NOTE — ED Notes (Signed)
 Cleaned pt and changed the whole bed due to a BM. Pt is placed in a brief. Pt is resting at this time and call light in reach.

## 2023-09-21 NOTE — Progress Notes (Signed)
 Notified by Dr. Rhunette Croft that pt with h/o DM, bipolar disorder, COPD, NASAH, HTN, morbid obesity, and ESRD who missed HD today due to N/V.  She will likely be admitted and will plan for HD tomorrow, here if she remains hospitalized.

## 2023-09-21 NOTE — Progress Notes (Signed)
 PHARMACY - ANTICOAGULATION CONSULT NOTE  Pharmacy Consult for Heparin Indication: splenic infarction  Allergies  Allergen Reactions   Codeine Nausea And Vomiting   Tape Other (See Comments) and Rash    Pull skin off.  Paper tape is ok    Patient Measurements:   Heparin Dosing Weight: 90 kg  Vital Signs: Temp: 98.7 F (37.1 C) (02/24 2000) Temp Source: Oral (02/24 2000) BP: 162/72 (02/24 2230) Pulse Rate: 64 (02/24 2230)  Labs: Recent Labs    09/21/23 1746  HGB 9.4*  HCT 33.1*  PLT 91*  CREATININE 10.64*    CrCl cannot be calculated (Unknown ideal weight.).   Medical History: Past Medical History:  Diagnosis Date   Acid reflux    takes Tums   Anemia    Arthritis    Bipolar 1 disorder (HCC)    Blindness of left eye    left eye removed   Carpal tunnel syndrome, bilateral    Cervical radiculopathy    CHF (congestive heart failure) (HCC)    Chronic kidney disease    Stage 5- 01/25/17   Constipation    COPD (chronic obstructive pulmonary disease) (HCC) 2014   bronchitis   Degenerative disc disease, thoracic    Depression    Diabetes mellitus    Type II   Diabetic retinopathy (HCC)    Dyspnea    when walking   End stage kidney disease (HCC)    M, W, F [facility]   Gout    Hypertension    Non-alcoholic cirrhosis (HCC)    Noncompliance with medication regimen    Noncompliance with medication regimen    Obesity (BMI 30-39.9)    OSA (obstructive sleep apnea)    cpap   Panic attack    RLS (restless legs syndrome)     Medications:  No current facility-administered medications on file prior to encounter.   Current Outpatient Medications on File Prior to Encounter  Medication Sig Dispense Refill   ADVAIR DISKUS 250-50 MCG/ACT AEPB Inhale 1 puff into the lungs 2 (two) times daily. (Patient not taking: Reported on 08/28/2023)     albuterol (PROVENTIL) (2.5 MG/3ML) 0.083% nebulizer solution Take 3 mLs (2.5 mg total) by nebulization every 12 (twelve)  hours as needed for wheezing or shortness of breath. 75 mL 0   ALPRAZolam (XANAX) 1 MG tablet Take 1 tablet (1 mg total) by mouth 4 (four) times daily as needed for anxiety. (Patient taking differently: Take 1 mg by mouth 4 (four) times daily.) 9 tablet 0   Artificial Tear Ointment (DRY EYES OP) Place 1 drop into the right eye 2 (two) times daily.     atorvastatin (LIPITOR) 20 MG tablet Take by mouth.     atropine (ISOPTO ATROPINE) 1 % ophthalmic solution Apply to eye.     benzonatate (TESSALON) 200 MG capsule Take 1 capsule (200 mg total) by mouth 3 (three) times daily. 20 capsule 0   Blood Glucose Monitoring Suppl (FORA V30A BLOOD GLUCOSE SYSTEM) DEVI      brimonidine (ALPHAGAN) 0.2 % ophthalmic solution PLACE 1 DROP INTO THE RIGHT EYE 3 TIMES A DAY.     busPIRone (BUSPAR) 7.5 MG tablet Take 1 tablet (7.5 mg total) by mouth 2 (two) times daily. 7 tablet 0   calcium carbonate (TUMS EX) 750 MG chewable tablet Chew 1-3 tablets by mouth See admin instructions. Take 3 tablets with each meal & take 1 tablets with each snack     cinacalcet (SENSIPAR) 60 MG tablet  Take 60 mg by mouth daily.     colchicine 0.6 MG tablet TAKE 1 TABLET(0.6 MG) BY MOUTH DAILY 30 tablet 0   dextromethorphan (DELSYM) 30 MG/5ML liquid Take 5 mLs (30 mg total) by mouth 2 (two) times daily as needed for cough. 89 mL 0   dorzolamide-timolol (COSOPT) 22.3-6.8 MG/ML ophthalmic solution Place 1 drop into the right eye in the morning and at bedtime.     doxercalciferol (HECTOROL) 4 MCG/2ML injection Inject 0.5 mLs (1 mcg total) into the vein every Monday, Wednesday, and Friday with hemodialysis. 2 mL    FORACARE PREMIUM V10 TEST test strip      gabapentin (NEURONTIN) 100 MG capsule Take 1 capsule (100 mg total) by mouth at bedtime. (Patient taking differently: Take 300 mg by mouth at bedtime.) 7 capsule 0   guaiFENesin-dextromethorphan (ROBITUSSIN DM) 100-10 MG/5ML syrup Take 10 mLs by mouth every 8 (eight) hours. 118 mL 0    HYDROcodone-acetaminophen (NORCO) 10-325 MG tablet Take 1 tablet by mouth 4 (four) times daily.     insulin aspart (NOVOLOG) 100 UNIT/ML injection Inject 4-7 Units into the skin 3 (three) times daily with meals. Sliding scale insulin     Insulin Glargine (BASAGLAR KWIKPEN) 100 UNIT/ML Inject 30 Units into the skin at bedtime. 10 mL 0   latanoprost (XALATAN) 0.005 % ophthalmic solution Place 1 drop into the right eye 2 (two) times daily.     lidocaine-prilocaine (EMLA) cream Apply 1 application topically as needed (topical anesthesia for hemodialysis if Gebauers and Lidocaine injection are ineffective.). (Patient taking differently: Apply 1 application  topically daily as needed (dialysis.).) 30 g 0   methylPREDNISolone (MEDROL DOSEPAK) 4 MG TBPK tablet Medrol Dosepak take as instructed 21 tablet 0   omeprazole (PRILOSEC) 40 MG capsule Take 40 mg by mouth daily.     ondansetron (ZOFRAN) 4 MG tablet TAKE (1) TABLET THREE TIMES DAILY AS NEEDED FOR NAUSEA.     PARoxetine (PAXIL) 20 MG tablet Take 1 tablet (20 mg total) by mouth every evening. This is to prevent panic attacks (Patient taking differently: Take 20 mg by mouth at bedtime.) 7 tablet 0   VELPHORO 500 MG chewable tablet Chew 1,000 mg by mouth 3 (three) times daily.       Assessment: 58 y.o. female with splenic infarct for heparin Goal of Therapy:  Heparin level 0.3-0.7 units/ml Monitor platelets by anticoagulation protocol: Yes   Plan:  Heparin 4000 units IV bolus, then start heparin 1350 units/hr Check heparin level in 8 hours.   Eddie Candle 09/21/2023,11:46 PM

## 2023-09-21 NOTE — ED Notes (Addendum)
 Pt has called nurse to room 3 times in the past 30 minutes,  c/o being cold and having "panic attack" Dr Rhunette Croft made aware. Pt breathing fast, otherwise vitals are normal- new order received for xanax and blood gas- RT made aware. Pt also c/o abd pain and says she hasn't had xanax in 1 -1/2 days, suspecting pt sx related to withdrawsRhunette Croft aware

## 2023-09-22 ENCOUNTER — Inpatient Hospital Stay (HOSPITAL_COMMUNITY): Payer: Medicaid Other

## 2023-09-22 DIAGNOSIS — I10 Essential (primary) hypertension: Secondary | ICD-10-CM

## 2023-09-22 DIAGNOSIS — I120 Hypertensive chronic kidney disease with stage 5 chronic kidney disease or end stage renal disease: Secondary | ICD-10-CM | POA: Diagnosis present

## 2023-09-22 DIAGNOSIS — R112 Nausea with vomiting, unspecified: Secondary | ICD-10-CM | POA: Diagnosis not present

## 2023-09-22 DIAGNOSIS — E119 Type 2 diabetes mellitus without complications: Secondary | ICD-10-CM

## 2023-09-22 DIAGNOSIS — G4733 Obstructive sleep apnea (adult) (pediatric): Secondary | ICD-10-CM

## 2023-09-22 DIAGNOSIS — J449 Chronic obstructive pulmonary disease, unspecified: Secondary | ICD-10-CM

## 2023-09-22 DIAGNOSIS — K219 Gastro-esophageal reflux disease without esophagitis: Secondary | ICD-10-CM | POA: Diagnosis present

## 2023-09-22 DIAGNOSIS — Z79899 Other long term (current) drug therapy: Secondary | ICD-10-CM | POA: Diagnosis not present

## 2023-09-22 DIAGNOSIS — Z794 Long term (current) use of insulin: Secondary | ICD-10-CM | POA: Diagnosis not present

## 2023-09-22 DIAGNOSIS — Z1152 Encounter for screening for COVID-19: Secondary | ICD-10-CM | POA: Diagnosis not present

## 2023-09-22 DIAGNOSIS — D61818 Other pancytopenia: Secondary | ICD-10-CM | POA: Diagnosis present

## 2023-09-22 DIAGNOSIS — N186 End stage renal disease: Secondary | ICD-10-CM | POA: Diagnosis present

## 2023-09-22 DIAGNOSIS — K529 Noninfective gastroenteritis and colitis, unspecified: Secondary | ICD-10-CM

## 2023-09-22 DIAGNOSIS — F419 Anxiety disorder, unspecified: Secondary | ICD-10-CM | POA: Diagnosis present

## 2023-09-22 DIAGNOSIS — F418 Other specified anxiety disorders: Secondary | ICD-10-CM

## 2023-09-22 DIAGNOSIS — D735 Infarction of spleen: Principal | ICD-10-CM | POA: Diagnosis present

## 2023-09-22 DIAGNOSIS — K7460 Unspecified cirrhosis of liver: Secondary | ICD-10-CM | POA: Diagnosis present

## 2023-09-22 DIAGNOSIS — E782 Mixed hyperlipidemia: Secondary | ICD-10-CM

## 2023-09-22 DIAGNOSIS — K7581 Nonalcoholic steatohepatitis (NASH): Secondary | ICD-10-CM | POA: Diagnosis present

## 2023-09-22 DIAGNOSIS — Z992 Dependence on renal dialysis: Secondary | ICD-10-CM | POA: Diagnosis not present

## 2023-09-22 DIAGNOSIS — G2581 Restless legs syndrome: Secondary | ICD-10-CM | POA: Diagnosis present

## 2023-09-22 DIAGNOSIS — Z7951 Long term (current) use of inhaled steroids: Secondary | ICD-10-CM | POA: Diagnosis not present

## 2023-09-22 DIAGNOSIS — M109 Gout, unspecified: Secondary | ICD-10-CM | POA: Diagnosis present

## 2023-09-22 DIAGNOSIS — F17210 Nicotine dependence, cigarettes, uncomplicated: Secondary | ICD-10-CM | POA: Diagnosis present

## 2023-09-22 DIAGNOSIS — R0682 Tachypnea, not elsewhere classified: Secondary | ICD-10-CM | POA: Diagnosis present

## 2023-09-22 DIAGNOSIS — E1122 Type 2 diabetes mellitus with diabetic chronic kidney disease: Secondary | ICD-10-CM | POA: Diagnosis present

## 2023-09-22 DIAGNOSIS — N2581 Secondary hyperparathyroidism of renal origin: Secondary | ICD-10-CM | POA: Diagnosis present

## 2023-09-22 DIAGNOSIS — E1140 Type 2 diabetes mellitus with diabetic neuropathy, unspecified: Secondary | ICD-10-CM | POA: Diagnosis present

## 2023-09-22 DIAGNOSIS — F319 Bipolar disorder, unspecified: Secondary | ICD-10-CM | POA: Diagnosis present

## 2023-09-22 LAB — CBG MONITORING, ED: Glucose-Capillary: 78 mg/dL (ref 70–99)

## 2023-09-22 LAB — CBC
HCT: 30.8 % — ABNORMAL LOW (ref 36.0–46.0)
Hemoglobin: 9.3 g/dL — ABNORMAL LOW (ref 12.0–15.0)
MCH: 27.8 pg (ref 26.0–34.0)
MCHC: 30.2 g/dL (ref 30.0–36.0)
MCV: 91.9 fL (ref 80.0–100.0)
Platelets: 89 10*3/uL — ABNORMAL LOW (ref 150–400)
RBC: 3.35 MIL/uL — ABNORMAL LOW (ref 3.87–5.11)
RDW: 21.1 % — ABNORMAL HIGH (ref 11.5–15.5)
WBC: 2.4 10*3/uL — ABNORMAL LOW (ref 4.0–10.5)
nRBC: 0 % (ref 0.0–0.2)

## 2023-09-22 LAB — COMPREHENSIVE METABOLIC PANEL
ALT: 9 U/L (ref 0–44)
AST: 9 U/L — ABNORMAL LOW (ref 15–41)
Albumin: 3.6 g/dL (ref 3.5–5.0)
Alkaline Phosphatase: 101 U/L (ref 38–126)
Anion gap: 15 (ref 5–15)
BUN: 37 mg/dL — ABNORMAL HIGH (ref 6–20)
CO2: 23 mmol/L (ref 22–32)
Calcium: 9.3 mg/dL (ref 8.9–10.3)
Chloride: 103 mmol/L (ref 98–111)
Creatinine, Ser: 11.27 mg/dL — ABNORMAL HIGH (ref 0.44–1.00)
GFR, Estimated: 4 mL/min — ABNORMAL LOW (ref >60)
Glucose, Bld: 94 mg/dL (ref 70–99)
Potassium: 4.6 mmol/L (ref 3.5–5.1)
Sodium: 141 mmol/L (ref 135–145)
Total Bilirubin: 1.4 mg/dL — ABNORMAL HIGH (ref 0.0–1.2)
Total Protein: 6.8 g/dL (ref 6.5–8.1)

## 2023-09-22 LAB — HEPARIN LEVEL (UNFRACTIONATED)
Heparin Unfractionated: 0.12 [IU]/mL — ABNORMAL LOW (ref 0.30–0.70)
Heparin Unfractionated: 0.23 [IU]/mL — ABNORMAL LOW (ref 0.30–0.70)

## 2023-09-22 LAB — LIPASE, BLOOD: Lipase: 63 U/L — ABNORMAL HIGH (ref 11–51)

## 2023-09-22 LAB — LACTATE DEHYDROGENASE: LDH: 132 U/L (ref 98–192)

## 2023-09-22 LAB — MAGNESIUM: Magnesium: 2 mg/dL (ref 1.7–2.4)

## 2023-09-22 LAB — HEPATITIS B SURFACE ANTIGEN: Hepatitis B Surface Ag: NONREACTIVE

## 2023-09-22 LAB — PHOSPHORUS: Phosphorus: 7.7 mg/dL — ABNORMAL HIGH (ref 2.5–4.6)

## 2023-09-22 MED ORDER — INSULIN ASPART 100 UNIT/ML IJ SOLN: 0.0000 [IU] | Freq: Three times a day (TID) | INTRAMUSCULAR | Status: DC

## 2023-09-22 MED ORDER — HEPARIN (PORCINE) 25000 UT/250ML-% IV SOLN
1750.0000 [IU]/h | INTRAVENOUS | Status: DC
  Administered 2023-09-22: 1600 [IU]/h via INTRAVENOUS
  Filled 2023-09-22: qty 250

## 2023-09-22 MED ORDER — ONDANSETRON HCL 4 MG PO TABS
4.0000 mg | ORAL_TABLET | Freq: Four times a day (QID) | ORAL | Status: DC | PRN
Start: 2023-09-22 — End: 2023-09-23

## 2023-09-22 MED ORDER — PANTOPRAZOLE SODIUM 40 MG PO TBEC: 40.0000 mg | DELAYED_RELEASE_TABLET | Freq: Every day | ORAL | Status: DC

## 2023-09-22 MED ORDER — DIPHENHYDRAMINE HCL 50 MG/ML IJ SOLN
INTRAMUSCULAR | Status: AC
  Filled 2023-09-22: qty 1

## 2023-09-22 MED ORDER — DIPHENHYDRAMINE HCL 25 MG PO CAPS
25.0000 mg | ORAL_CAPSULE | Freq: Three times a day (TID) | ORAL | Status: DC | PRN
  Administered 2023-09-22: 25 mg via ORAL
  Filled 2023-09-22: qty 1

## 2023-09-22 MED ORDER — HYDROCODONE-ACETAMINOPHEN 10-325 MG PO TABS: 1.0000 | ORAL_TABLET | Freq: Four times a day (QID) | ORAL | Status: DC

## 2023-09-22 MED ORDER — DIPHENOXYLATE-ATROPINE 2.5-0.025 MG PO TABS
1.0000 | ORAL_TABLET | Freq: Four times a day (QID) | ORAL | Status: DC | PRN
Start: 2023-09-22 — End: 2023-09-23

## 2023-09-22 MED ORDER — HEPARIN BOLUS VIA INFUSION
2500.0000 [IU] | Freq: Once | INTRAVENOUS | Status: AC
  Administered 2023-09-22: 2500 [IU] via INTRAVENOUS

## 2023-09-22 MED ORDER — ACETAMINOPHEN 650 MG RE SUPP: 650.0000 mg | Freq: Four times a day (QID) | RECTAL | Status: DC | PRN

## 2023-09-22 MED ORDER — DIPHENHYDRAMINE HCL 50 MG/ML IJ SOLN: 25.0000 mg | Freq: Once | INTRAMUSCULAR | Status: DC

## 2023-09-22 MED ORDER — ALPRAZOLAM 1 MG PO TABS
1.0000 mg | ORAL_TABLET | Freq: Two times a day (BID) | ORAL | Status: DC | PRN
  Administered 2023-09-22 (×2): 1 mg via ORAL
  Filled 2023-09-22 (×2): qty 2

## 2023-09-22 MED ORDER — INSULIN GLARGINE-YFGN 100 UNIT/ML ~~LOC~~ SOLN: 10.0000 [IU] | Freq: Every day | SUBCUTANEOUS | Status: DC

## 2023-09-22 MED ORDER — INSULIN ASPART 100 UNIT/ML IJ SOLN: 0.0000 [IU] | Freq: Every day | INTRAMUSCULAR | Status: DC

## 2023-09-22 MED ORDER — HEPARIN BOLUS VIA INFUSION
1350.0000 [IU] | Freq: Once | INTRAVENOUS | Status: AC
  Administered 2023-09-22: 1350 [IU] via INTRAVENOUS

## 2023-09-22 MED ORDER — ALPRAZOLAM 0.5 MG PO TABS
1.0000 mg | ORAL_TABLET | Freq: Once | ORAL | Status: AC
  Administered 2023-09-22: 1 mg via ORAL
  Filled 2023-09-22: qty 2

## 2023-09-22 MED ORDER — ONDANSETRON HCL 4 MG/2ML IJ SOLN
4.0000 mg | Freq: Four times a day (QID) | INTRAMUSCULAR | Status: DC | PRN
  Administered 2023-09-22: 4 mg via INTRAVENOUS
  Filled 2023-09-22: qty 2

## 2023-09-22 MED ORDER — HYDROCODONE-ACETAMINOPHEN 10-325 MG PO TABS
1.0000 | ORAL_TABLET | Freq: Four times a day (QID) | ORAL | Status: DC | PRN
  Administered 2023-09-22: 1 via ORAL
  Filled 2023-09-22: qty 1

## 2023-09-22 MED ORDER — INSULIN ASPART 100 UNIT/ML IJ SOLN
0.0000 [IU] | Freq: Every day | INTRAMUSCULAR | Status: DC
Start: 2023-09-22 — End: 2023-09-23

## 2023-09-22 MED ORDER — ACETAMINOPHEN 325 MG PO TABS: 650.0000 mg | ORAL_TABLET | Freq: Four times a day (QID) | ORAL | Status: DC | PRN

## 2023-09-22 MED ORDER — HYDRALAZINE HCL 20 MG/ML IJ SOLN
10.0000 mg | Freq: Four times a day (QID) | INTRAMUSCULAR | Status: DC | PRN
Start: 2023-09-22 — End: 2023-09-23

## 2023-09-22 NOTE — H&P (Signed)
 History and Physical    Patient: Martha [name] NWG:[id number] DOB: [birth year] DOA: 09/21/2023 DOS: the patient was seen and examined on 09/22/2023 PCP: Carmel Sacramento, NP  Patient coming from: Home  Chief Complaint:  Chief Complaint  Patient presents with   Emesis   HPI: Martha [name] is a 58 y.o. female with medical history significant of ESRD on HD (MWF) COPD, hypertension, NASH, AOCD left eye blindness, type 2 diabetes mellitus with retinopathy, RLS, CHF who presents to the emergency department due to shortness of breath and anxiety.  She complained of several days of onset of intermittent episodes of nausea, vomiting, diarrhea and abdominal pain, this worsened in the last 5 days with difficulty in being able to keep anything down including her anxiety medication and she feels that she may be having a panic attack. Patient missed her dialysis yesterday due to vomiting, last dialysis was on Friday (2/21).  She denies fever, chills, chest pain. Patient was recently admitted from 1/31 to 08/30/2023 due to acute bronchiolitis due to RSV and acute respiratory failure with hypoxia  ED Course:  In the emergency department, BP was 174/73, respiratory rate was 22/min, other vital signs were within normal range.  Workup in the ED showed WBC 2.4, hemoglobin 9.4, hematocrit 33.1, platelets 91, magnesium 9.1, phosphorus 6.9.  Influenza A, B, SARS coronavirus 2, RSV was negative. CT abdomen and pelvis with contrast showed splenomegaly with wedge-shaped mid splenic infarcts, new from prior.  Possible lower abdominal wall cellulitis. Chest x-ray showed diffuse interstitial prominence similar to prior study and could reflect edema or atypical infection Patient was started on heparin drip due to splenic infarction, lorazepam was given, Zofran was given and IV fentanyl was given due to abdominal pain.  Hospitalist was asked admit patient for further evaluation and management.   Review of Systems: Review of  systems as noted in the HPI. All other systems reviewed and are negative.   Past Medical History:  Diagnosis Date   Acid reflux    takes Tums   Anemia    Arthritis    Bipolar 1 disorder (HCC)    Blindness of left eye    left eye removed   Carpal tunnel syndrome, bilateral    Cervical radiculopathy    CHF (congestive heart failure) (HCC)    Chronic kidney disease    Stage 5- 01/25/17   Constipation    COPD (chronic obstructive pulmonary disease) (HCC) 2014   bronchitis   Degenerative disc disease, thoracic    Depression    Diabetes mellitus    Type II   Diabetic retinopathy (HCC)    Dyspnea    when walking   End stage kidney disease (HCC)    M, W, F [facility]   Gout    Hypertension    Non-alcoholic cirrhosis (HCC)    Noncompliance with medication regimen    Noncompliance with medication regimen    Obesity (BMI 30-39.9)    OSA (obstructive sleep apnea)    cpap   Panic attack    RLS (restless legs syndrome)    Past Surgical History:  Procedure Laterality Date   AORTIC ARCH ANGIOGRAPHY N/A 09/11/2020   Procedure: AORTIC ARCH ANGIOGRAPHY;  Surgeon: Nada Libman, MD;  Location: [facility];  Service: Cardiovascular;  Laterality: N/A;   AV FISTULA PLACEMENT Right 01/27/2017   Procedure: ARTERIOVENOUS (AV) FISTULA CREATION-RIGHT ARM;  Surgeon: Sherren Kerns, MD;  Location: [facility] OR;  Service: Vascular;  Laterality: Right;  AV FISTULA PLACEMENT Left 08/03/2018   Procedure: LEFT ARTERIOVENOUS (AV) FISTULA CREATION;  Surgeon: Sherren Kerns, MD;  Location: [facility] OR;  Service: Vascular;  Laterality: Left;   BASCILIC VEIN TRANSPOSITION Left 04/26/2019   Procedure: SECOND STAGE BASILIC VEIN TRANSPOSITION LEFT ARM;  Surgeon: Sherren Kerns, MD;  Location: [facility] OR;  Service: Vascular;  Laterality: Left;   BIOPSY N/A 05/17/2013   Procedure: BIOPSY;  Surgeon: West Bali, MD;  Location: [facility] ORS;  Service: Endoscopy;  Laterality: N/A;   CARPAL TUNNEL RELEASE Left  11/25/2018   Procedure: LEFT CARPAL TUNNEL RELEASE;  Surgeon: Dairl Ponder, MD;  Location: [facility] OR;  Service: Orthopedics;  Laterality: Left;   CATARACT EXTRACTION Right    CHOLECYSTECTOMY N/A 04/27/2014   Procedure: LAPAROSCOPIC CHOLECYSTECTOMY;  Surgeon: Marlane Hatcher, MD;  Location: [facility];  Service: General;  Laterality: N/A;   COLONOSCOPY  06/06/2010   ION:GEXBMW bleeding secondary to internal hemorrhoids but incomplete evaluation secondary to poor right colon prep/small rectal and sigmoid colon polyps (hyperplastic). PROPOFOL   COLONOSCOPY  May 2013   Dr. Gilliam/[facility]: 5 mm a descending colon polyp, hyperplastic. Adequate bowel prep.   ENDOMETRIAL ABLATION     ENUCLEATION Left 02/04/2018   ENUCLEATION WITH PLACEMENT OF IMPLANT LEFT EYE   ENUCLEATION Left 02/04/2018   Procedure: ENUCLEATION WITH PLACEMENT OF IMPLANT LEFT EYE;  Surgeon: Floydene Flock, MD;  Location: [facility] OR;  Service: Ophthalmology;  Laterality: Left;   ESOPHAGOGASTRODUODENOSCOPY  06/06/2010   UXL:KGMWNUU erythema and edema of body of stomach, with sessile polypoid lesions. bx benign. no h.pylori   ESOPHAGOGASTRODUODENOSCOPY (EGD) WITH PROPOFOL N/A 05/17/2013   Dr. Darrick Penna: normal esophagus, moderate nodular gastritis, negative path, empiric Savary dilation   EYE SURGERY  07/2017   sx for glaucoma   FLEXIBLE SIGMOIDOSCOPY N/A 05/17/2013   Dr. Darrick Penna: moderate sized internal hemorrhoids   HEMORRHOID BANDING N/A 05/17/2013   Procedure: HEMORRHOID BANDING;  Surgeon: West Bali, MD;  Location: [facility];  Service: Endoscopy;  Laterality: N/A;  2 bands placed   INCISION AND DRAINAGE ABSCESS Left 10/11/2013   Procedure: INCISION AND DRAINAGE AND DEBRIDEMENT LEFT BREAST  ABSCESS;  Surgeon: Marlane Hatcher, MD;  Location: [facility];  Service: General;  Laterality: Left;   INSERTION OF DIALYSIS CATHETER N/A 06/08/2018   Procedure: INSERTION OF Right internal jugular TUNNELED  DIALYSIS CATHETER;  Surgeon: Chuck Hint, MD;  Location: [facility] OR;  Service: Vascular;  Laterality: N/A;   IRRIGATION AND DEBRIDEMENT ABSCESS Right 06/01/2013   Procedure: INCISION AND DRAINAGE AND DEBRIDEMENT ABSCESS RIGHT BREAST;  Surgeon: Marlane Hatcher, MD;  Location: [facility];  Service: General;  Laterality: Right;   LEFT EYE REMOVED Left 01/2018   [facility] on [address].   LIGATION OF ARTERIOVENOUS  FISTULA Right 06/08/2018   Procedure: LIGATION OF ARTERIOVENOUS  FISTULA RIGHT ARM;  Surgeon: Chuck Hint, MD;  Location: [facility] OR;  Service: Vascular;  Laterality: Right;   ORIF ANKLE FRACTURE Right 12/17/2018   Procedure: OPEN REDUCTION INTERNAL FIXATION (ORIF) ANKLE FRACTURE;  Surgeon: Felecia Shelling, DPM;  Location: [facility] OR;  Service: Podiatry;  Laterality: Right;   REVISON OF ARTERIOVENOUS FISTULA Left 09/26/2020   Procedure: LEFT ARTERIOVENOUS FISTULA  DISTAL REVASCULARIZATION AND LIGATION ( DRIL ) USING GREATER SAPHENOUS VEIN;  Surgeon: Maeola Harman, MD;  Location: [facility] OR;  Service: Vascular;  Laterality: Left;   SAVORY DILATION N/A 05/17/2013   Procedure: SAVORY DILATION;  Surgeon: West Bali, MD;  Location: [facility];  Service: Endoscopy;  Laterality: N/A;  14/15/16   SKIN FULL THICKNESS GRAFT Left 05/05/2018   Procedure: ABDOMINAL DERMIS FAT SKIN GRAFT FULL THICKNESS LEFT EYE;  Surgeon: Floydene Flock, MD;  Location: [facility] OR;  Service: Ophthalmology;  Laterality: Left;   TUBAL LIGATION     VEIN HARVEST Left 09/26/2020   Procedure: LEFT LEG GREATER SAPHENOUS VEIN HARVEST;  Surgeon: Maeola Harman, MD;  Location: [facility] OR;  Service: Vascular;  Laterality: Left;    Social History:  reports that she has been smoking cigarettes and e-cigarettes. She started smoking about 40 years ago. She has a 37.5 pack-year smoking history. She has never used smokeless tobacco. She reports that she does not drink alcohol and does not use drugs.   Allergies  Allergen Reactions   Codeine  Nausea And Vomiting   Tape Other (See Comments) and Rash    Pull skin off.  Paper tape is ok    Family History  Adopted: Yes  Family history unknown: Yes     Prior to Admission medications   Medication Sig Start Date End Date Taking? Authorizing Provider  ADVAIR DISKUS 250-50 MCG/ACT AEPB Inhale 1 puff into the lungs 2 (two) times daily. Patient not taking: Reported on 08/28/2023 07/01/22   [provider]  albuterol (PROVENTIL) (2.5 MG/3ML) 0.083% nebulizer solution Take 3 mLs (2.5 mg total) by nebulization every 12 (twelve) hours as needed for wheezing or shortness of breath. 08/05/22   Valentino Nose, NP  ALPRAZolam Prudy Feeler) 1 MG tablet Take 1 tablet (1 mg total) by mouth 4 (four) times daily as needed for anxiety. Patient taking differently: Take 1 mg by mouth 4 (four) times daily. 07/12/20   Rhetta Mura, MD  Artificial Tear Ointment (DRY EYES OP) Place 1 drop into the right eye 2 (two) times daily.    [provider]  atorvastatin (LIPITOR) 20 MG tablet Take by mouth. 10/21/18   [provider]  atropine (ISOPTO ATROPINE) 1 % ophthalmic solution Apply to eye. 12/27/19   [provider]  benzonatate (TESSALON) 200 MG capsule Take 1 capsule (200 mg total) by mouth 3 (three) times daily. 08/30/23   Kendell Bane, MD  Blood Glucose Monitoring Suppl (FORA V30A BLOOD GLUCOSE SYSTEM) DEVI  09/22/20   [provider]  brimonidine (ALPHAGAN) 0.2 % ophthalmic solution PLACE 1 DROP INTO THE RIGHT EYE 3 TIMES A DAY. 05/24/21   [provider]  busPIRone (BUSPAR) 7.5 MG tablet Take 1 tablet (7.5 mg total) by mouth 2 (two) times daily. 07/12/20   Rhetta Mura, MD  calcium carbonate (TUMS EX) 750 MG chewable tablet Chew 1-3 tablets by mouth See admin instructions. Take 3 tablets with each meal & take 1 tablets with each snack    [provider]  cinacalcet (SENSIPAR) 60 MG tablet Take 60 mg by mouth daily.    [provider]  colchicine 0.6 MG tablet TAKE 1 TABLET(0.6 MG) BY MOUTH DAILY 06/04/23   Felecia Shelling, DPM  dextromethorphan (DELSYM) 30 MG/5ML liquid Take 5 mLs (30 mg total) by mouth 2 (two) times daily as needed for cough. 11/05/22   Johnson, Clanford L, MD  dorzolamide-timolol (COSOPT) 22.3-6.8 MG/ML ophthalmic solution Place 1 drop into the right eye in the morning and at bedtime. 06/01/20   [provider]  doxercalciferol (HECTOROL) 4 MCG/2ML injection Inject 0.5 mLs (1 mcg total) into the vein every Monday, Wednesday, and Friday with hemodialysis. 12/03/18  Kari Baars, MD  [facility] PREMIUM V10 TEST test strip  09/22/20   [provider]  gabapentin (NEURONTIN) 100 MG capsule Take 1 capsule (100 mg total) by mouth at bedtime. Patient taking differently: Take 300 mg by mouth at bedtime. 07/12/20   Rhetta Mura, MD  guaiFENesin-dextromethorphan (ROBITUSSIN DM) 100-10 MG/5ML syrup Take 10 mLs by mouth every 8 (eight) hours. 08/30/23   ShahmehdiGemma Payor, MD  HYDROcodone-acetaminophen (NORCO) 10-325 MG tablet Take 1 tablet by mouth 4 (four) times daily. 08/30/20   [provider]  insulin aspart (NOVOLOG) 100 UNIT/ML injection Inject 4-7 Units into the skin 3 (three) times daily with meals. Sliding scale insulin 11/05/22   Johnson, Clanford L, MD  Insulin Glargine (BASAGLAR KWIKPEN) 100 UNIT/ML Inject 30 Units into the skin at bedtime. 07/12/20   Rhetta Mura, MD  latanoprost (XALATAN) 0.005 % ophthalmic solution Place 1 drop into the right eye 2 (two) times daily. 08/22/20   [provider]  lidocaine-prilocaine (EMLA) cream Apply 1 application topically as needed (topical anesthesia for hemodialysis if Gebauers and Lidocaine injection are ineffective.). Patient taking differently: Apply 1 application  topically daily as needed (dialysis.). 04/25/17   Johnson, Clanford L, MD  methylPREDNISolone (MEDROL DOSEPAK) 4 MG TBPK tablet Medrol Dosepak take as  instructed 08/30/23   Shahmehdi, Guadalupe Maple A, MD  omeprazole (PRILOSEC) 40 MG capsule Take 40 mg by mouth daily.    [provider]  ondansetron (ZOFRAN) 4 MG tablet TAKE (1) TABLET THREE TIMES DAILY AS NEEDED FOR NAUSEA. 07/30/22   [provider]  PARoxetine (PAXIL) 20 MG tablet Take 1 tablet (20 mg total) by mouth every evening. This is to prevent panic attacks Patient taking differently: Take 20 mg by mouth at bedtime. 07/12/20   Rhetta Mura, MD  VELPHORO 500 MG chewable tablet Chew 1,000 mg by mouth 3 (three) times daily. 08/05/23   [provider]    Physical Exam: BP (!) 147/61   Pulse 60   Temp 98.4 F (36.9 C)   Resp 10   LMP 08/03/2008 (Exact Date)   SpO2 100%   General: 58 y.o. year-old female well developed well nourished in no acute distress.  Alert and oriented x3. HEENT: NCAT, EOMI Neck: Supple, trachea medial Cardiovascular: Regular rate and rhythm with no rubs or gallops.  No thyromegaly or JVD noted.  No lower extremity edema. 2/4 pulses in all 4 extremities. Respiratory: Clear to auscultation with no wheezes or rales. Good inspiratory effort. Abdomen: Soft, tender to palpation of lower abdominal quadrants.  Nondistended with normal bowel sounds x4 quadrants. Muskuloskeletal: No cyanosis, clubbing or edema noted bilaterally Neuro: CN II-XII intact, strength 5/5 x 4, sensation, reflexes intact Skin: No ulcerative lesions noted or rashes Psychiatry: Judgement and insight appear normal. Mood is appropriate for condition and setting          Labs on Admission:  Basic Metabolic Panel: Recent Labs  Lab 09/21/23 1746 09/22/23 0723  NA 143 141  K 4.9 4.6  CL 102 103  CO2 19* 23  GLUCOSE 98 94  BUN 32* 37*  CREATININE 10.64* 11.27*  CALCIUM 9.7 9.3  MG 1.9 2.0  PHOS 6.9* 7.7*   Liver Function Tests: Recent Labs  Lab 09/21/23 1746 09/22/23 0723  AST 13* 9*  ALT 8 9  ALKPHOS 108 101  BILITOT 1.5* 1.4*  PROT 7.4 6.8  ALBUMIN 3.8  3.6   Recent Labs  Lab 09/22/23 0723  LIPASE 63*   No results for  input(s): "AMMONIA" in the last 168 hours. CBC: Recent Labs  Lab 09/21/23 1746 09/22/23 0723  WBC 2.4* 2.4*  HGB 9.4* 9.3*  HCT 33.1* 30.8*  MCV 97.4 91.9  PLT 91* 89*   Cardiac Enzymes: No results for input(s): "CKTOTAL", "CKMB", "CKMBINDEX", "TROPONINI" in the last 168 hours.  BNP (last 3 results) Recent Labs    11/04/22 0024 08/28/23 0913  BNP 398.0* 1,369.0*    ProBNP (last 3 results) No results for input(s): "PROBNP" in the last 8760 hours.  CBG: No results for input(s): "GLUCAP" in the last 168 hours.  Radiological Exams on Admission: CT ABDOMEN PELVIS W CONTRAST Result Date: 09/21/2023 CLINICAL DATA:  Abdominal pain, nausea/vomiting/diarrhea, missed dialysis EXAM: CT ABDOMEN AND PELVIS WITH CONTRAST TECHNIQUE: Multidetector CT imaging of the abdomen and pelvis was performed using the standard protocol following bolus administration of intravenous contrast. RADIATION DOSE REDUCTION: This exam was performed according to the departmental dose-optimization program which includes automated exposure control, adjustment of the mA and/or kV according to patient size and/or use of iterative reconstruction technique. CONTRAST:  OMNIPAQUE IOHEXOL 300 MG/ML  SOLN COMPARISON:  01/06/2023 FINDINGS: Lower chest: Mild left basilar atelectasis. Hepatobiliary: Liver is within normal limits. Status post cholecystectomy. No intrahepatic or extrahepatic ductal dilatation mid Pancreas: Within normal limits. Spleen: Splenomegaly with wedge-shaped mid splenic infarct (series 2/image 17), new from prior. Adrenals/Urinary Tract: Adrenal glands are within normal limits. Bilateral renal atrophy.  No hydronephrosis. Thick-walled bladder, although underdistended. Stomach/Bowel: Stomach is within normal limits. No evidence of bowel obstruction. Normal appendix (series 2/image 56). No colonic wall thickening or inflammatory changes.  Left colon is decompressed. Vascular/Lymphatic: No evidence of abdominal aortic aneurysm. Atherosclerotic calcifications of the abdominal aorta and branch vessels, although vessels remain patent. No suspicious abdominopelvic lymphadenopathy. Reproductive: Uterus and bilateral ovaries are within normal limits. Other: No abdominopelvic ascites. Musculoskeletal: Mild subcutaneous stranding along the anterior abdominal wall, favors medication injection sites, although left inferior cutaneous thickening could reflect superimposed cellulitis (series 2/image 86). Degenerative changes of the thoracolumbar spine with prominent Schmorl's node deformity along the inferior aspect of L1. IMPRESSION: Splenomegaly with wedge-shaped mid splenic infarct, new from prior. Possible lower abdominal wall cellulitis, equivocal. Additional ancillary findings as above. Electronically Signed   By: Charline Bills M.D.   On: 09/21/2023 22:26   DG Chest Port 1 View Result Date: 09/21/2023 CLINICAL DATA:  Evaluate for fluid overload. Missed dialysis. Nausea, vomiting, diarrhea. EXAM: PORTABLE CHEST 1 VIEW COMPARISON:  08/28/2023 FINDINGS: Heart is borderline in size. Mediastinal contours within normal limits. Aortic atherosclerosis. Interstitial prominence within the lungs, similar to prior study. No effusions or acute bony abnormality. IMPRESSION: Diffuse interstitial prominence similar to prior study and could reflect edema or atypical infection. Electronically Signed   By: Charlett Nose M.D.   On: 09/21/2023 20:13    EKG: I independently viewed the EKG done and my findings are as followed: Normal sinus rhythm at rate of 90 bpm  Assessment/Plan Present on Admission:  Splenic infarction  GERD  Essential hypertension  Depression with anxiety  Chronic obstructive pulmonary disease (HCC)  Principal Problem:   Splenic infarction Active Problems:   GERD   Depression with anxiety   Essential hypertension   OSA on CPAP    Chronic obstructive pulmonary disease (HCC)   Nausea & vomiting   Anxiety  Splenic infarction Patient was started on IV heparin drip Lipase and LDH levels will be checked Abdominal ultrasound will be done Consider hematology consult  Nausea and vomiting  in the setting of above Continue Zofran as needed  Anxiety Continue home Xanax  ESRD on HD (MWF) Patient missed dialysis yesterday due to vomiting Nephrologist was consulted by EDP for maintenance dialysis  Controlled type 2 diabetes mellitus without complication, without long-term current use of insulin hemoglobin A1c about 10 months ago was 5.6 Continue diet and lifestyle modification Continue CBG 4 times daily, before meals and at bedtime    GERD Continue PPI  Chronic diarrhea Continue Lomotil   Essential hypertension Continue IV hydralazine 10 mg every 6 hours as needed for SBP greater than 170  Mixed hyperlipidemia Continue home statin (monitor liver enzymes considering patient's history of NASH)  COPD Continue albuterol per home regimen  OSA on CPAP Continue CPAP    DVT prophylaxis: Heparin drip  Code Status: Full code  Family Communication: None at bedside  Severity of Illness: The appropriate patient status for this patient is INPATIENT. Inpatient status is judged to be reasonable and necessary in order to provide the required intensity of service to ensure the patient's safety. The patient's presenting symptoms, physical exam findings, and initial radiographic and laboratory data in the context of their chronic comorbidities is felt to place them at high risk for further clinical deterioration. Furthermore, it is not anticipated that the patient will be medically stable for discharge from the hospital within 2 midnights of admission.   * I certify that at the point of admission it is my clinical judgment that the patient will require inpatient hospital care spanning beyond 2 midnights from the point of  admission due to high intensity of service, high risk for further deterioration and high frequency of surveillance required.*  Author: Frankey Shown, DO 09/22/2023 8:12 AM  For on call review [email or web address].

## 2023-09-22 NOTE — Progress Notes (Signed)
 Pt signed AMA form at 2114 saying her decision is final and didn't want to wait and talk to the physician. Heparin drip stopped and IV access removed per pts requests. MD informed. Pt alert and oriented. Pt left hospital room at 2245 with daughter per wheelchair.

## 2023-09-22 NOTE — TOC Initial Note (Signed)
 Transition of Care [facility]) - Initial/Assessment Note    Patient Details  Name: Martha [name] MRN: [id number] Date of Birth: 03-16-1966  Transition of Care [facility]) CM/SW Contact:    Karn Cassis, LCSW Phone Number: 09/22/2023, 10:46 AM  Clinical Narrative:  Pt admitted for splenic infarction. Assessment completed due to high risk readmission score. Pt reports she lives alone, but has a CAP aid 40+ hours a week. She uses [facility] for transportation to appointments. Pt's dialysis is on MWF schedule at [facility]. She requests wheelchair as her current wheelchair is 56 years old and she said it is falling apart. LCSW contacted Zach with [employer] who reports pt should qualify for new wheelchair. Referral made. Pt updated. Zach notified pt requesting wheelchair be delivered to room. TOC will follow.                  Expected Discharge Plan: Home/Self Care Barriers to Discharge: Continued Medical Work up   Patient Goals and CMS Choice Patient states their goals for this hospitalization and ongoing recovery are:: return home   Choice offered to / list presented to : Patient [place] ownership interest in [facility].provided to::  (n/a)    Expected Discharge Plan and Services In-house Referral: Clinical Social Work   Post Acute Care Choice: Durable Medical Equipment Living arrangements for the past 2 months: Apartment                 DME Arranged: Government social research officer DME Agency: [employer] Date DME Agency Contacted: 09/22/23 Time DME Agency Contacted: 1045 Representative spoke with at DME Agency: Ian Malkin            Prior Living Arrangements/Services Living arrangements for the past 2 months: Apartment Lives with:: Self Patient language and need for interpreter reviewed:: Yes Do you feel safe going back to the place where you live?: Yes      Need for Family Participation in Patient Care: No (Comment)   Current home services: DME (walker, shower seat,  wheelchair) Criminal Activity/Legal Involvement Pertinent to Current Situation/Hospitalization: No - Comment as needed  Activities of Daily Living      Permission Sought/Granted                  Emotional Assessment     Affect (typically observed): Appropriate Orientation: : Oriented to Self, Oriented to Place, Oriented to  Time, Oriented to Situation Alcohol / Substance Use: Not Applicable Psych Involvement: No (comment)  Admission diagnosis:  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] Patient Active Problem List   Diagnosis Date Noted   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   Nausea & vomiting 09/22/2023   Anxiety 09/22/2023   RSV (acute bronchiolitis due to respiratory syncytial virus) 08/28/2023   Open abdominal wall wound 03/11/2023   Opioid dependence (HCC) 10/18/2021   Cellulitis and abscess of hand 10/18/2021   Cellulitis of left hand 10/17/2021   ESRD (end stage renal disease) (HCC) 09/26/2020   Hypoalbuminemia 07/06/2020   Acute respiratory failure with hypoxia (HCC) 07/06/2020   CAP (community acquired pneumonia) 07/05/2020   Trimalleolar fracture of ankle, closed, right, initial encounter 12/17/2018   Pain and swelling of ankle, right 12/16/2018   Metabolic encephalopathy 09/26/2018   SIRS (systemic inflammatory response syndrome) (HCC) 06/23/2018   UTI (urinary tract infection) 02/26/2018   Volume overload 02/25/2018   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   Chronic diarrhea 10/08/2017   Anemia in chronic kidney disease 05/24/2017   Chronic obstructive pulmonary disease (HCC) 05/24/2017  Heart failure (HCC) 05/24/2017   Osteopathy in diseases classified elsewhere, unspecified site 05/24/2017   Anasarca associated with disorder of kidney 04/20/2017   Protein-calorie malnutrition, severe 11/29/2016   HTN (hypertension), malignant 11/20/2016   Bipolar 1 disorder (HCC) 11/20/2016   Noncompliance with medication regimen 11/20/2016   Panic attack 11/20/2016    Controlled type 2 diabetes mellitus without complication, without long-term current use of insulin (HCC) 10/27/2016   Sensory disturbance 10/27/2016   Left hemiparesis (HCC) 10/27/2016   Cholelithiasis 04/26/2014   Hyponatremia 04/26/2014   Gallstones 04/06/2014   Diabetic ulcer of left great toe (HCC) 10/09/2013   Type 2 diabetes mellitus treated with insulin (HCC) 05/30/2013   Hyperglycemia due to diabetes mellitus (HCC) 05/30/2013   Internal hemorrhoids with other complication 04/28/2013   Umbilical hernia 04/15/2013   Obesity, Class III, BMI 40-49.9 (morbid obesity) (HCC) 10/08/2012   Hyperlipidemia 05/13/2010   Morbid obesity (HCC) 05/13/2010   Depression with anxiety 05/13/2010   RESTLESS LEG SYNDROME 05/13/2010   Essential hypertension 05/13/2010   GERD 05/13/2010   RECTAL BLEEDING 05/13/2010   OSA on CPAP 05/13/2010   Dysphagia, oropharyngeal phase 05/13/2010   PCP:  Carmel Sacramento, NP Pharmacy:   [facility] [phone] - [place], [place] - [address] AT [facility] [address] [place] [address] Phone: [phone] Fax: [phone]     Social Drivers of Health (SDOH) Social History: SDOH Screenings   Food Insecurity: No Food Insecurity (08/28/2023)  Housing: Low Risk  (08/28/2023)  Transportation Needs: No Transportation Needs (08/28/2023)  Utilities: Not At Risk (08/28/2023)  Depression (PHQ2-9): Low Risk  (03/11/2023)  Tobacco Use: High Risk (09/21/2023)   SDOH Interventions:     Readmission Risk Interventions    09/22/2023   10:43 AM 10/18/2021   12:12 PM  Readmission Risk Prevention Plan  Transportation Screening Complete Complete  HRI or Home Care Consult  Complete  Social Work Consult for Recovery Care Planning/Counseling  Complete  Palliative Care Screening  Not Applicable  Medication Review Oceanographer) Complete Complete  HRI or Home Care Consult Complete   SW Recovery Care/Counseling Consult Complete    Palliative Care Screening Not Applicable   Skilled Nursing Facility Not Applicable

## 2023-09-22 NOTE — Progress Notes (Addendum)
 Patient suffers from arthritis, CHF, and COPD which impairs their ability to perform daily activities like ambulating in the home. A walker will not resolve issue with performing activities of daily living. A wheelchair will allow patient to safely perform daily activities. Patient can safely propel the wheelchair in the home or has a caregiver who can provide assistance.   Due to patients height/weight ratio, she will require bariatric wheelchair.

## 2023-09-22 NOTE — ED Notes (Signed)
Report given to nurse on floor 

## 2023-09-22 NOTE — Procedures (Signed)
 HD Note:  Some information was entered later than the data was gathered due to patient care needs. The stated time with the data is accurate.  Received patient in bed to unit.   Alert and oriented.  Patient is blind and gave verbal consent for treatment.  Patient stated she was allergic to the stickers for EKG monitoring.  Dr. Allena Katz notified.  Agreed to continue with treatment without the monitoring in place.  O2 sat and BP  monitoring in place.  Informed consent signed and in chart.   Access used: Left upper arm AVF Access issues: None  Patient remained agitated and irritable during time in the dialysis unit.  Patient was given medication for itching that was helpful for approximately 30 min.  Patient stated that the stretcher was too uncomfortable.  Explained to patient that we could not allow her to sit on the side of the stretcher and did not have a dialysis chair that we could use.  Patient complained of cramping and requested 200 ml NS back.  This was not effective after 10 min and the patient stated she could not wait any longer for it to resolve and wanted to be done with treatment.  She stated that she would not want anymore UF removed.  It was explained that the cleaning portion of the treatment still needed to be done.  Patient stated again that she wanted to have treatment stopped.  AMA signed.  TX duration: 1 hour and 25 min  Alert, without acute distress.  Total UF removed: 300  Hand-off given to patient's nurse.   Transported back to the room   Darolyn Double L. Dareen Piano, RN Kidney Dialysis Unit.

## 2023-09-22 NOTE — Progress Notes (Addendum)
 Patient was admitted earlier this month by [facility] colleague. Briefly seen and examined Assessment and plan reviewed and revised  58 year old female with ESRD-HD-MWF, obesity, HTN, NASH, COPD, chronic nausea vomiting diarrhea, bipolar disorder, vision impairment Lives at home alone Patient states she has been struggling with chronic nausea, vomiting and diarrhea for quite a while.  Takes Imodium as needed for diarrhea.  In the last 5 days, symptoms are worse, could not hold anything down. Also missed hemodialysis on 2/24 Presented to ED  No fever, blood pressure in 150s and 150s Hemoglobin 9.3, WC count 2.4 CT abdomen with splenomegaly with wedge-shaped mid splenic infarct, new from prior scan from June 2024.  No evidence of colitis.  Admitted to [facility] Nephrology was consulted  At the time of my evaluation, patient was sitting up at the edge of the bed.  Complaining of itching everywhere.  Takes Benadryl PRN at home.  I do not see any skin lesions or hives or any evidence of acute reaction. Asking for Xanax and pain meds.   Assessment and plan  Acute on chronic gastroenteritis  Presented with flare up of chronic nausea, vomiting, diarrhea for 5 days.  Was unable to hold anything down including her medicines.    CT abdomen did not show any evidence of colitis Abdominal ultrasound ordered Lipase level mildly elevated to 63, LDH normal. As needed Zofran for nausea vomiting Stool studies -sent for GI pathogen panel Lomotil PRN for diarrhea Recent Labs  Lab 09/21/23 1746 09/22/23 0723  WBC 2.4* 2.4*   Splenic infarction Pancytopenia CT abdomen and pelvis with contrast showed splenomegaly with wedge-shaped mid splenic infarcts, new from prior.  Unclear cause of splenic infarction.  Unclear if it led to profound nausea and vomiting. Currently on heparin drip Hematology Dr. Ellin Saba consulted for splenic infarction and pancytopenia Recent Labs  Lab 09/21/23 1746 09/22/23 0723  WBC  2.4* 2.4*  HGB 9.4* 9.3*  HCT 33.1* 30.8*  MCV 97.4 91.9  PLT 91* 89*   ESRD-HD-MWF Patient missed dialysis yesterday due to vomiting Nephrologist was consulted by EDP for maintenance dialysis  H/o DM2 A1c 5.6 in April 2024 PTA meds-Lantus 30 units nightly, Premeal SSI Blood glucose levels normal currently. Start SSI  Hypertension Not on any meds at home for hypertension IV hydralazine as needed  HLD Continue statin  Anxiety Continue Xanax, buspirone   GERD Continue PPI   COPD Continue albuterol per home regimen   OSA on CPAP Continue CPAP  Gout ??  Was on colchicine daily  Neuropathy Gabapentin 300 mg at bedtime

## 2023-09-22 NOTE — Progress Notes (Signed)
 PHARMACY - ANTICOAGULATION CONSULT NOTE  Pharmacy Consult for Heparin Indication: splenic infarction  Allergies  Allergen Reactions   Codeine Nausea And Vomiting   Tape Other (See Comments) and Rash    Pull skin off.  Paper tape is ok    Patient Measurements:   Heparin Dosing Weight: 90 kg  Vital Signs: Temp: 97.3 F (36.3 C) (02/25 1303) Temp Source: Oral (02/25 0828) BP: 146/54 (02/25 1534) Pulse Rate: 65 (02/25 1534)  Labs: Recent Labs    09/21/23 1746 09/22/23 0723 09/22/23 1625  HGB 9.4* 9.3*  --   HCT 33.1* 30.8*  --   PLT 91* 89*  --   HEPARINUNFRC  --  0.12* 0.23*  CREATININE 10.64* 11.27*  --     CrCl cannot be calculated (Unknown ideal weight.).   Medical History: Past Medical History:  Diagnosis Date   Acid reflux    takes Tums   Anemia    Arthritis    Bipolar 1 disorder (HCC)    Blindness of left eye    left eye removed   Carpal tunnel syndrome, bilateral    Cervical radiculopathy    CHF (congestive heart failure) (HCC)    Chronic kidney disease    Stage 5- 01/25/17   Constipation    COPD (chronic obstructive pulmonary disease) (HCC) 2014   bronchitis   Degenerative disc disease, thoracic    Depression    Diabetes mellitus    Type II   Diabetic retinopathy (HCC)    Dyspnea    when walking   End stage kidney disease (HCC)    M, W, F [facility]   Gout    Hypertension    Non-alcoholic cirrhosis (HCC)    Noncompliance with medication regimen    Noncompliance with medication regimen    Obesity (BMI 30-39.9)    OSA (obstructive sleep apnea)    cpap   Panic attack    RLS (restless legs syndrome)     Medications:  No current facility-administered medications on file prior to encounter.   Current Outpatient Medications on File Prior to Encounter  Medication Sig Dispense Refill   albuterol (PROVENTIL) (2.5 MG/3ML) 0.083% nebulizer solution Take 3 mLs (2.5 mg total) by nebulization every 12 (twelve) hours as needed for wheezing  or shortness of breath. 75 mL 0   Artificial Tear Ointment (DRY EYES OP) Place 1 drop into the right eye 2 (two) times daily.     atorvastatin (LIPITOR) 20 MG tablet Take by mouth.     calcium carbonate (TUMS EX) 750 MG chewable tablet Chew 1-3 tablets by mouth See admin instructions. Take 3 tablets with each meal & take 1 tablets with each snack     doxercalciferol (HECTOROL) 4 MCG/2ML injection Inject 0.5 mLs (1 mcg total) into the vein every Monday, Wednesday, and Friday with hemodialysis. 2 mL    insulin glargine (LANTUS SOLOSTAR) 100 UNIT/ML Solostar Pen Inject 30 Units into the skin daily.     lidocaine-prilocaine (EMLA) cream Apply 1 application topically as needed (topical anesthesia for hemodialysis if Gebauers and Lidocaine injection are ineffective.). (Patient taking differently: Apply 1 application  topically daily as needed (dialysis.).) 30 g 0   omeprazole (PRILOSEC) 40 MG capsule Take 40 mg by mouth daily.     ondansetron (ZOFRAN) 4 MG tablet TAKE (1) TABLET THREE TIMES DAILY AS NEEDED FOR NAUSEA.     VELPHORO 500 MG chewable tablet Chew 1,000 mg by mouth 3 (three) times daily.     ADVAIR  DISKUS 250-50 MCG/ACT AEPB Inhale 1 puff into the lungs 2 (two) times daily. (Patient not taking: Reported on 08/28/2023)     ALPRAZolam (XANAX) 1 MG tablet Take 1 tablet (1 mg total) by mouth 4 (four) times daily as needed for anxiety. (Patient taking differently: Take 1 mg by mouth 4 (four) times daily.) 9 tablet 0   atropine (ISOPTO ATROPINE) 1 % ophthalmic solution Apply to eye.     benzonatate (TESSALON) 200 MG capsule Take 1 capsule (200 mg total) by mouth 3 (three) times daily. (Patient not taking: Reported on 09/22/2023) 20 capsule 0   Blood Glucose Monitoring Suppl (FORA V30A BLOOD GLUCOSE SYSTEM) DEVI      brimonidine (ALPHAGAN) 0.2 % ophthalmic solution PLACE 1 DROP INTO THE RIGHT EYE 3 TIMES A DAY.     busPIRone (BUSPAR) 7.5 MG tablet Take 1 tablet (7.5 mg total) by mouth 2 (two) times  daily. (Patient not taking: Reported on 09/22/2023) 7 tablet 0   cinacalcet (SENSIPAR) 60 MG tablet Take 60 mg by mouth daily. (Patient not taking: Reported on 09/22/2023)     colchicine 0.6 MG tablet TAKE 1 TABLET(0.6 MG) BY MOUTH DAILY 30 tablet 0   FORACARE PREMIUM V10 TEST test strip      gabapentin (NEURONTIN) 100 MG capsule Take 1 capsule (100 mg total) by mouth at bedtime. (Patient taking differently: Take 300 mg by mouth at bedtime.) 7 capsule 0   HYDROcodone-acetaminophen (NORCO) 10-325 MG tablet Take 1 tablet by mouth 4 (four) times daily.     latanoprost (XALATAN) 0.005 % ophthalmic solution Place 1 drop into the right eye 2 (two) times daily.     PARoxetine (PAXIL) 20 MG tablet Take 1 tablet (20 mg total) by mouth every evening. This is to prevent panic attacks (Patient taking differently: Take 20 mg by mouth at bedtime.) 7 tablet 0   [DISCONTINUED] dextromethorphan (DELSYM) 30 MG/5ML liquid Take 5 mLs (30 mg total) by mouth 2 (two) times daily as needed for cough. (Patient not taking: Reported on 09/22/2023) 89 mL 0   [DISCONTINUED] dorzolamide-timolol (COSOPT) 22.3-6.8 MG/ML ophthalmic solution Place 1 drop into the right eye in the morning and at bedtime. (Patient not taking: Reported on 09/22/2023)     [DISCONTINUED] guaiFENesin-dextromethorphan (ROBITUSSIN DM) 100-10 MG/5ML syrup Take 10 mLs by mouth every 8 (eight) hours. (Patient not taking: Reported on 09/22/2023) 118 mL 0   [DISCONTINUED] insulin aspart (NOVOLOG) 100 UNIT/ML injection Inject 4-7 Units into the skin 3 (three) times daily with meals. Sliding scale insulin (Patient not taking: Reported on 09/22/2023)     [DISCONTINUED] Insulin Glargine (BASAGLAR KWIKPEN) 100 UNIT/ML Inject 30 Units into the skin at bedtime. (Patient not taking: Reported on 09/22/2023) 10 mL 0   [DISCONTINUED] methylPREDNISolone (MEDROL DOSEPAK) 4 MG TBPK tablet Medrol Dosepak take as instructed (Patient not taking: Reported on 09/22/2023) 21 tablet 0     Assessment: 58 y.o. female with splenic infarct for heparin.  HL 0.23 - subtherapeutic Hgb 9.3 Platelets 89  Goal of Therapy:  Heparin level 0.3-0.7 units/ml Monitor platelets by anticoagulation protocol: Yes   Plan:  Give heparin bolus of 1350 units IV x 1 Increase heparin infusion to 1750 units/hr Check heparin level in 8 hours.  Monitor H&H and platelets.  Thank you for involving pharmacy in this patient's care.   Rockwell Alexandria, PharmD Clinical Pharmacist 09/22/2023 5:42 PM

## 2023-09-22 NOTE — ED Notes (Signed)
 Pt sleeping, using CPAP and wanting to hold PO meds until she is awake.

## 2023-09-22 NOTE — Progress Notes (Signed)
 PHARMACY - ANTICOAGULATION CONSULT NOTE  Pharmacy Consult for Heparin Indication: splenic infarction  Allergies  Allergen Reactions   Codeine Nausea And Vomiting   Tape Other (See Comments) and Rash    Pull skin off.  Paper tape is ok    Patient Measurements:   Heparin Dosing Weight: 90 kg  Vital Signs: Temp: 98.4 F (36.9 C) (02/25 0431) Temp Source: Oral (02/25 0049) BP: 147/61 (02/25 0600) Pulse Rate: 60 (02/25 0600)  Labs: Recent Labs    09/21/23 1746 09/22/23 0723  HGB 9.4* 9.3*  HCT 33.1* 30.8*  PLT 91* 89*  HEPARINUNFRC  --  0.12*  CREATININE 10.64* 11.27*    CrCl cannot be calculated (Unknown ideal weight.).   Medical History: Past Medical History:  Diagnosis Date   Acid reflux    takes Tums   Anemia    Arthritis    Bipolar 1 disorder (HCC)    Blindness of left eye    left eye removed   Carpal tunnel syndrome, bilateral    Cervical radiculopathy    CHF (congestive heart failure) (HCC)    Chronic kidney disease    Stage 5- 01/25/17   Constipation    COPD (chronic obstructive pulmonary disease) (HCC) 2014   bronchitis   Degenerative disc disease, thoracic    Depression    Diabetes mellitus    Type II   Diabetic retinopathy (HCC)    Dyspnea    when walking   End stage kidney disease (HCC)    M, W, F [facility]   Gout    Hypertension    Non-alcoholic cirrhosis (HCC)    Noncompliance with medication regimen    Noncompliance with medication regimen    Obesity (BMI 30-39.9)    OSA (obstructive sleep apnea)    cpap   Panic attack    RLS (restless legs syndrome)     Medications:  No current facility-administered medications on file prior to encounter.   Current Outpatient Medications on File Prior to Encounter  Medication Sig Dispense Refill   ADVAIR DISKUS 250-50 MCG/ACT AEPB Inhale 1 puff into the lungs 2 (two) times daily. (Patient not taking: Reported on 08/28/2023)     albuterol (PROVENTIL) (2.5 MG/3ML) 0.083% nebulizer  solution Take 3 mLs (2.5 mg total) by nebulization every 12 (twelve) hours as needed for wheezing or shortness of breath. 75 mL 0   ALPRAZolam (XANAX) 1 MG tablet Take 1 tablet (1 mg total) by mouth 4 (four) times daily as needed for anxiety. (Patient taking differently: Take 1 mg by mouth 4 (four) times daily.) 9 tablet 0   Artificial Tear Ointment (DRY EYES OP) Place 1 drop into the right eye 2 (two) times daily.     atorvastatin (LIPITOR) 20 MG tablet Take by mouth.     atropine (ISOPTO ATROPINE) 1 % ophthalmic solution Apply to eye.     benzonatate (TESSALON) 200 MG capsule Take 1 capsule (200 mg total) by mouth 3 (three) times daily. 20 capsule 0   Blood Glucose Monitoring Suppl (FORA V30A BLOOD GLUCOSE SYSTEM) DEVI      brimonidine (ALPHAGAN) 0.2 % ophthalmic solution PLACE 1 DROP INTO THE RIGHT EYE 3 TIMES A DAY.     busPIRone (BUSPAR) 7.5 MG tablet Take 1 tablet (7.5 mg total) by mouth 2 (two) times daily. 7 tablet 0   calcium carbonate (TUMS EX) 750 MG chewable tablet Chew 1-3 tablets by mouth See admin instructions. Take 3 tablets with each meal & take 1 tablets  with each snack     cinacalcet (SENSIPAR) 60 MG tablet Take 60 mg by mouth daily.     colchicine 0.6 MG tablet TAKE 1 TABLET(0.6 MG) BY MOUTH DAILY 30 tablet 0   dextromethorphan (DELSYM) 30 MG/5ML liquid Take 5 mLs (30 mg total) by mouth 2 (two) times daily as needed for cough. 89 mL 0   dorzolamide-timolol (COSOPT) 22.3-6.8 MG/ML ophthalmic solution Place 1 drop into the right eye in the morning and at bedtime.     doxercalciferol (HECTOROL) 4 MCG/2ML injection Inject 0.5 mLs (1 mcg total) into the vein every Monday, Wednesday, and Friday with hemodialysis. 2 mL    FORACARE PREMIUM V10 TEST test strip      gabapentin (NEURONTIN) 100 MG capsule Take 1 capsule (100 mg total) by mouth at bedtime. (Patient taking differently: Take 300 mg by mouth at bedtime.) 7 capsule 0   guaiFENesin-dextromethorphan (ROBITUSSIN DM) 100-10 MG/5ML  syrup Take 10 mLs by mouth every 8 (eight) hours. 118 mL 0   HYDROcodone-acetaminophen (NORCO) 10-325 MG tablet Take 1 tablet by mouth 4 (four) times daily.     insulin aspart (NOVOLOG) 100 UNIT/ML injection Inject 4-7 Units into the skin 3 (three) times daily with meals. Sliding scale insulin     Insulin Glargine (BASAGLAR KWIKPEN) 100 UNIT/ML Inject 30 Units into the skin at bedtime. 10 mL 0   latanoprost (XALATAN) 0.005 % ophthalmic solution Place 1 drop into the right eye 2 (two) times daily.     lidocaine-prilocaine (EMLA) cream Apply 1 application topically as needed (topical anesthesia for hemodialysis if Gebauers and Lidocaine injection are ineffective.). (Patient taking differently: Apply 1 application  topically daily as needed (dialysis.).) 30 g 0   methylPREDNISolone (MEDROL DOSEPAK) 4 MG TBPK tablet Medrol Dosepak take as instructed 21 tablet 0   omeprazole (PRILOSEC) 40 MG capsule Take 40 mg by mouth daily.     ondansetron (ZOFRAN) 4 MG tablet TAKE (1) TABLET THREE TIMES DAILY AS NEEDED FOR NAUSEA.     PARoxetine (PAXIL) 20 MG tablet Take 1 tablet (20 mg total) by mouth every evening. This is to prevent panic attacks (Patient taking differently: Take 20 mg by mouth at bedtime.) 7 tablet 0   VELPHORO 500 MG chewable tablet Chew 1,000 mg by mouth 3 (three) times daily.       Assessment: 58 y.o. female with splenic infarct for heparin.  HL 0.12- subtherapeutic Hgb 9.3 Platelets 89  Goal of Therapy:  Heparin level 0.3-0.7 units/ml Monitor platelets by anticoagulation protocol: Yes   Plan:  Heparin 2500 units IV x 1 Increase heparin infusion to 1600 units/hr Check heparin level in 8 hours.  Monitor H&H and platelets.  Judeth Cornfield, PharmD Clinical Pharmacist 09/22/2023 8:26 AM

## 2023-09-22 NOTE — ED Notes (Addendum)
 Pt has on her cpap from home - taken her xanax- given her teddy bear- and tucked in bed with blankets.

## 2023-09-22 NOTE — ED Notes (Signed)
 Pt says nausea is better, pt has not vomited, pt given several sips of gingerale- tolerated well.

## 2023-09-22 NOTE — Progress Notes (Signed)
 RN called due to patient leaving AMA.  She already signed AMA form without waiting for the physician.

## 2023-09-22 NOTE — ED Notes (Signed)
 Dr Thomes Dinning at bedside. Ok for pt to use cpap from home- verbal order received for xanax PO

## 2023-09-22 NOTE — Consult Note (Signed)
 Reason for Consult: Continuity of ESRD care Referring Physician: Lorin Glass MD [facility])  HPI:  58 year old woman with past medical history significant for hypertension, obesity, nonalcoholic steatohepatitis, COPD, bipolar disorder, visual impairment of the left eye and end-stage renal disease on hemodialysis at the [facility] unit in [place] (MWF).  Presented to the emergency room overnight with increasing shortness of breath and anxiety along with nausea, vomiting, diarrhea and abdominal pain with intolerance of oral intake including medications.  In the emergency room found to have splenomegaly with wedge-shaped mid splenic infarct and possible lower abdominal wall cellulitis responsible for some of her symptoms.  She was started on heparin drip and symptomatic management for pain/nausea.  Seen in the emergency room this morning, she complains of feeling anxious and restless from being in the emergency room overnight.  She is apprehensive about going home (reassured that she is not).  Past Medical History:  Diagnosis Date   Acid reflux    takes Tums   Anemia    Arthritis    Bipolar 1 disorder (HCC)    Blindness of left eye    left eye removed   Carpal tunnel syndrome, bilateral    Cervical radiculopathy    CHF (congestive heart failure) (HCC)    Chronic kidney disease    Stage 5- 01/25/17   Constipation    COPD (chronic obstructive pulmonary disease) (HCC) 2014   bronchitis   Degenerative disc disease, thoracic    Depression    Diabetes mellitus    Type II   Diabetic retinopathy (HCC)    Dyspnea    when walking   End stage kidney disease (HCC)    M, W, F [facility]   Gout    Hypertension    Non-alcoholic cirrhosis (HCC)    Noncompliance with medication regimen    Noncompliance with medication regimen    Obesity (BMI 30-39.9)    OSA (obstructive sleep apnea)    cpap   Panic attack    RLS (restless legs syndrome)     Past Surgical History:  Procedure Laterality  Date   AORTIC ARCH ANGIOGRAPHY N/A 09/11/2020   Procedure: AORTIC ARCH ANGIOGRAPHY;  Surgeon: Nada Libman, MD;  Location: [facility];  Service: Cardiovascular;  Laterality: N/A;   AV FISTULA PLACEMENT Right 01/27/2017   Procedure: ARTERIOVENOUS (AV) FISTULA CREATION-RIGHT ARM;  Surgeon: Sherren Kerns, MD;  Location[facility] OR;  Service: Vascular;  Laterality: Right;   AV FISTULA PLACEMENT Left 08/03/2018   Procedure: LEFT ARTERIOVENOUS (AV) FISTULA CREATION;  Surgeon: Sherren Kerns, MD;  Location: [facility] OR;  Service: Vascular;  Laterality: Left;   BASCILIC VEIN TRANSPOSITION Left 04/26/2019   Procedure: SECOND STAGE BASILIC VEIN TRANSPOSITION LEFT ARM;  Surgeon: Sherren Kerns, MD;  Location: [facility] OR;  Service: Vascular;  Laterality: Left;   BIOPSY N/A 05/17/2013   Procedure: BIOPSY;  Surgeon: West Bali, MD;  Location: [facility] ORS;  Service: Endoscopy;  Laterality: N/A;   CARPAL TUNNEL RELEASE Left 11/25/2018   Procedure: LEFT CARPAL TUNNEL RELEASE;  Surgeon: Dairl Ponder, MD;  Location: [facility] OR;  Service: Orthopedics;  Laterality: Left;   CATARACT EXTRACTION Right    CHOLECYSTECTOMY N/A 04/27/2014   Procedure: LAPAROSCOPIC CHOLECYSTECTOMY;  Surgeon: Marlane Hatcher, MD;  Location: [facility]S;  Service: General;  Laterality: N/A;   COLONOSCOPY  06/06/2010   ZOX:[id number] bleeding secondary to internal hemorrhoids but incomplete evaluation secondary to poor right colon prep/small rectal and sigmoid colon polyps (hyperplastic). PROPOFOL  COLONOSCOPY  May 2013   Dr. Gilliam/[facility]: 5 mm a descending colon polyp, hyperplastic. Adequate bowel prep.   ENDOMETRIAL ABLATION     ENUCLEATION Left 02/04/2018   ENUCLEATION WITH PLACEMENT OF IMPLANT LEFT EYE   ENUCLEATION Left 02/04/2018   Procedure: ENUCLEATION WITH PLACEMENT OF IMPLANT LEFT EYE;  Surgeon: Floydene Flock, MD;  Location: [facility] OR;  Service: Ophthalmology;  Laterality: Left;   ESOPHAGOGASTRODUODENOSCOPY  06/06/2010   WUJ:[id number]  erythema and edema of body of stomach, with sessile polypoid lesions. bx benign. no h.pylori   ESOPHAGOGASTRODUODENOSCOPY (EGD) WITH PROPOFOL N/A 05/17/2013   Dr. Darrick Penna: normal esophagus, moderate nodular gastritis, negative path, empiric Savary dilation   EYE SURGERY  07/2017   sx for glaucoma   FLEXIBLE SIGMOIDOSCOPY N/A 05/17/2013   Dr. Darrick Penna: moderate sized internal hemorrhoids   HEMORRHOID BANDING N/A 05/17/2013   Procedure: HEMORRHOID BANDING;  Surgeon: West Bali, MD;  Location: [facility];  Service: Endoscopy;  Laterality: N/A;  2 bands placed   INCISION AND DRAINAGE ABSCESS Left 10/11/2013   Procedure: INCISION AND DRAINAGE AND DEBRIDEMENT LEFT BREAST  ABSCESS;  Surgeon: Marlane Hatcher, MD;  Location: [facility];  Service: General;  Laterality: Left;   INSERTION OF DIALYSIS CATHETER N/A 06/08/2018   Procedure: INSERTION OF Right internal jugular TUNNELED  DIALYSIS CATHETER;  Surgeon: Chuck Hint, MD;  Location: [facility] OR;  Service: Vascular;  Laterality: N/A;   IRRIGATION AND DEBRIDEMENT ABSCESS Right 06/01/2013   Procedure: INCISION AND DRAINAGE AND DEBRIDEMENT ABSCESS RIGHT BREAST;  Surgeon: Marlane Hatcher, MD;  Location: [facility];  Service: General;  Laterality: Right;   LEFT EYE REMOVED Left 01/2018   [facility] [address].   LIGATION OF ARTERIOVENOUS  FISTULA Right 06/08/2018   Procedure: LIGATION OF ARTERIOVENOUS  FISTULA RIGHT ARM;  Surgeon: Chuck Hint, MD;  Location: [facility] OR;  Service: Vascular;  Laterality: Right;   ORIF ANKLE FRACTURE Right 12/17/2018   Procedure: OPEN REDUCTION INTERNAL FIXATION (ORIF) ANKLE FRACTURE;  Surgeon: Felecia Shelling, DPM;  Location: [facility] OR;  Service: Podiatry;  Laterality: Right;   REVISON OF ARTERIOVENOUS FISTULA Left 09/26/2020   Procedure: LEFT ARTERIOVENOUS FISTULA  DISTAL REVASCULARIZATION AND LIGATION ( DRIL ) USING GREATER SAPHENOUS VEIN;  Surgeon: Maeola Harman, MD;  Location: [facility] OR;  Service:  Vascular;  Laterality: Left;   SAVORY DILATION N/A 05/17/2013   Procedure: SAVORY DILATION;  Surgeon: West Bali, MD;  Location: [facility];  Service: Endoscopy;  Laterality: N/A;  14/15/16   SKIN FULL THICKNESS GRAFT Left 05/05/2018   Procedure: ABDOMINAL DERMIS FAT SKIN GRAFT FULL THICKNESS LEFT EYE;  Surgeon: Floydene Flock, MD;  Location: [facility] OR;  Service: Ophthalmology;  Laterality: Left;   TUBAL LIGATION     VEIN HARVEST Left 09/26/2020   Procedure: LEFT LEG GREATER SAPHENOUS VEIN HARVEST;  Surgeon: Maeola Harman, MD;  Location: [facility] OR;  Service: Vascular;  Laterality: Left;    Family History  Adopted: Yes  Family history unknown: Yes    Social History:  reports that she has been smoking cigarettes and e-cigarettes. She started smoking about 40 years ago. She has a 37.5 pack-year smoking history. She has never used smokeless tobacco. She reports that she does not drink alcohol and does not use drugs.  Allergies:  Allergies  Allergen Reactions   Codeine Nausea And Vomiting   Tape Other (See Comments) and Rash    Pull skin off.  Paper tape is ok  Medications: I have reviewed the patient's current medications. Scheduled:  Chlorhexidine Gluconate Cloth  6 each Topical Q0600   insulin aspart  0-5 Units Subcutaneous QHS   insulin aspart  0-9 Units Subcutaneous TID WC   pantoprazole  40 mg Oral Daily   Continuous:  heparin 1,600 Units/hr (09/22/23 0954)      Latest Ref Rng & Units 09/22/2023    7:23 AM 09/21/2023    5:46 PM 08/30/2023    4:25 AM  BMP  Glucose 70 - 99 mg/dL 94  98  161   BUN 6 - 20 mg/dL 37  32  52   Creatinine 0.44 - 1.00 mg/dL 09.60  45.40  9.81   Sodium 135 - 145 mmol/L 141  143  137   Potassium 3.5 - 5.1 mmol/L 4.6  4.9  5.0   Chloride 98 - 111 mmol/L 103  102  95   CO2 22 - 32 mmol/L 23  19  25    Calcium 8.9 - 10.3 mg/dL 9.3  9.7  8.0       Latest Ref Rng & Units 09/22/2023    7:23 AM 09/21/2023    5:46 PM 08/30/2023    4:25 AM  CBC   WBC 4.0 - 10.5 K/uL 2.4  2.4  3.5   Hemoglobin 12.0 - 15.0 g/dL 9.3  9.4  9.4   Hematocrit 36.0 - 46.0 % 30.8  33.1  31.4   Platelets 150 - 400 K/uL 89  91  77     CT ABDOMEN PELVIS W CONTRAST Result Date: 09/21/2023 CLINICAL DATA:  Abdominal pain, nausea/vomiting/diarrhea, missed dialysis EXAM: CT ABDOMEN AND PELVIS WITH CONTRAST TECHNIQUE: Multidetector CT imaging of the abdomen and pelvis was performed using the standard protocol following bolus administration of intravenous contrast. RADIATION DOSE REDUCTION: This exam was performed according to the departmental dose-optimization program which includes automated exposure control, adjustment of the mA and/or kV according to patient size and/or use of iterative reconstruction technique. CONTRAST:  OMNIPAQUE IOHEXOL 300 MG/ML  SOLN COMPARISON:  01/06/2023 FINDINGS: Lower chest: Mild left basilar atelectasis. Hepatobiliary: Liver is within normal limits. Status post cholecystectomy. No intrahepatic or extrahepatic ductal dilatation mid Pancreas: Within normal limits. Spleen: Splenomegaly with wedge-shaped mid splenic infarct (series 2/image 17), new from prior. Adrenals/Urinary Tract: Adrenal glands are within normal limits. Bilateral renal atrophy.  No hydronephrosis. Thick-walled bladder, although underdistended. Stomach/Bowel: Stomach is within normal limits. No evidence of bowel obstruction. Normal appendix (series 2/image 56). No colonic wall thickening or inflammatory changes. Left colon is decompressed. Vascular/Lymphatic: No evidence of abdominal aortic aneurysm. Atherosclerotic calcifications of the abdominal aorta and branch vessels, although vessels remain patent. No suspicious abdominopelvic lymphadenopathy. Reproductive: Uterus and bilateral ovaries are within normal limits. Other: No abdominopelvic ascites. Musculoskeletal: Mild subcutaneous stranding along the anterior abdominal wall, favors medication injection sites, although left  inferior cutaneous thickening could reflect superimposed cellulitis (series 2/image 86). Degenerative changes of the thoracolumbar spine with prominent Schmorl's node deformity along the inferior aspect of L1. IMPRESSION: Splenomegaly with wedge-shaped mid splenic infarct, new from prior. Possible lower abdominal wall cellulitis, equivocal. Additional ancillary findings as above. Electronically Signed   By: Charline Bills M.D.   On: 09/21/2023 22:26   DG Chest Port 1 View Result Date: 09/21/2023 CLINICAL DATA:  Evaluate for fluid overload. Missed dialysis. Nausea, vomiting, diarrhea. EXAM: PORTABLE CHEST 1 VIEW COMPARISON:  08/28/2023 FINDINGS: Heart is borderline in size. Mediastinal contours within normal limits. Aortic atherosclerosis. Interstitial prominence within the lungs,  similar to prior study. No effusions or acute bony abnormality. IMPRESSION: Diffuse interstitial prominence similar to prior study and could reflect edema or atypical infection. Electronically Signed   By: Charlett Nose M.D.   On: 09/21/2023 20:13    Review of Systems  Constitutional:  Positive for chills and fatigue. Negative for fever.  HENT:  Negative for sore throat and trouble swallowing.   Respiratory:  Positive for shortness of breath. Negative for chest tightness.   Cardiovascular:  Negative for chest pain and leg swelling.  Gastrointestinal:  Positive for abdominal pain, diarrhea, nausea and vomiting. Negative for blood in stool.  Genitourinary:  Negative for dysuria and pelvic pain.  Musculoskeletal:  Negative for gait problem and joint swelling.  Neurological:  Positive for weakness.  Psychiatric/Behavioral:  The patient is nervous/anxious.    Blood pressure (!) 140/80, pulse 60, temperature 98.4 F (36.9 C), temperature source Oral, resp. rate 18, last menstrual period 08/03/2008, SpO2 100%. Physical Exam Vitals reviewed.  Constitutional:      Appearance: She is obese. She is not ill-appearing.  HENT:      Head: Normocephalic and atraumatic.     Right Ear: External ear normal.     Left Ear: External ear normal.     Mouth/Throat:     Mouth: Mucous membranes are moist.     Pharynx: Oropharynx is clear.  Eyes:     Comments: Erythema/crusting around left eye  Cardiovascular:     Rate and Rhythm: Normal rate and regular rhythm.     Pulses: Normal pulses.     Heart sounds: Normal heart sounds.  Pulmonary:     Effort: Pulmonary effort is normal.     Breath sounds: Normal breath sounds.  Abdominal:     General: Abdomen is flat. Bowel sounds are normal.     Palpations: Abdomen is soft.  Musculoskeletal:        General: Normal range of motion.     Cervical back: Normal range of motion and neck supple.     Right lower leg: Edema present.     Left lower leg: Edema present.     Comments: Trace lower extremity edema.  Left upper arm AV fistula with palpable thrill  Skin:    General: Skin is warm and dry.     Coloration: Skin is pale.  Neurological:     Mental Status: She is alert and oriented to person, place, and time.     Assessment/Plan: 1.  Splenic infarction: Started on heparin drip with additional lab testing ordered along with consideration of hematology consultation. 2.  End-stage renal disease: Resume hemodialysis with treatment ordered for today to make up for missed treatment yesterday. 3.  Hypertension: Continue ongoing blood pressure monitoring/control including on hemodialysis with ultrafiltration. 4.  Anemia: Hemoglobin and hematocrit are low without any overt blood loss.  Will continue to monitor to determine need for PRBC transfusion versus ESA. 5.  Secondary hyperparathyroidism: Will add on phosphorus level to this morning's labs and continue renal diet.  Dagoberto Ligas 09/22/2023, 10:05 AM

## 2023-09-23 LAB — HEPATITIS B SURFACE ANTIBODY, QUANTITATIVE: Hep B S AB Quant (Post): 3.6 m[IU]/mL — ABNORMAL LOW

## 2023-09-23 LAB — HEMOGLOBIN A1C
Hgb A1c MFr Bld: 5.2 % (ref 4.8–5.6)
Mean Plasma Glucose: 103 mg/dL

## 2023-10-20 ENCOUNTER — Emergency Department (HOSPITAL_COMMUNITY)

## 2023-10-20 ENCOUNTER — Encounter (HOSPITAL_COMMUNITY): Payer: Self-pay

## 2023-10-20 ENCOUNTER — Inpatient Hospital Stay (HOSPITAL_COMMUNITY)
Admission: EM | Admit: 2023-10-20 | Discharge: 2023-10-23 | DRG: 814 | Disposition: A | Discharge status: Home / Self Care | Attending: Family Medicine | Admitting: Family Medicine

## 2023-10-20 ENCOUNTER — Other Ambulatory Visit: Payer: Self-pay

## 2023-10-20 DIAGNOSIS — L309 Dermatitis, unspecified: Secondary | ICD-10-CM | POA: Diagnosis present

## 2023-10-20 DIAGNOSIS — R112 Nausea with vomiting, unspecified: Secondary | ICD-10-CM | POA: Diagnosis not present

## 2023-10-20 DIAGNOSIS — Z7951 Long term (current) use of inhaled steroids: Secondary | ICD-10-CM

## 2023-10-20 DIAGNOSIS — Z860100 Personal history of colon polyps, unspecified: Secondary | ICD-10-CM

## 2023-10-20 DIAGNOSIS — F418 Other specified anxiety disorders: Secondary | ICD-10-CM | POA: Diagnosis present

## 2023-10-20 DIAGNOSIS — E1122 Type 2 diabetes mellitus with diabetic chronic kidney disease: Secondary | ICD-10-CM | POA: Diagnosis present

## 2023-10-20 DIAGNOSIS — G2581 Restless legs syndrome: Secondary | ICD-10-CM | POA: Diagnosis present

## 2023-10-20 DIAGNOSIS — N186 End stage renal disease: Secondary | ICD-10-CM | POA: Diagnosis present

## 2023-10-20 DIAGNOSIS — K7460 Unspecified cirrhosis of liver: Secondary | ICD-10-CM | POA: Diagnosis present

## 2023-10-20 DIAGNOSIS — Z8679 Personal history of other diseases of the circulatory system: Secondary | ICD-10-CM

## 2023-10-20 DIAGNOSIS — R109 Unspecified abdominal pain: Secondary | ICD-10-CM | POA: Diagnosis present

## 2023-10-20 DIAGNOSIS — K529 Noninfective gastroenteritis and colitis, unspecified: Secondary | ICD-10-CM | POA: Diagnosis present

## 2023-10-20 DIAGNOSIS — D735 Infarction of spleen: Principal | ICD-10-CM | POA: Diagnosis present

## 2023-10-20 DIAGNOSIS — Z8614 Personal history of Methicillin resistant Staphylococcus aureus infection: Secondary | ICD-10-CM

## 2023-10-20 DIAGNOSIS — N250 Renal osteodystrophy: Secondary | ICD-10-CM | POA: Diagnosis present

## 2023-10-20 DIAGNOSIS — Z91158 Patient's noncompliance with renal dialysis for other reason: Secondary | ICD-10-CM

## 2023-10-20 DIAGNOSIS — K7581 Nonalcoholic steatohepatitis (NASH): Secondary | ICD-10-CM | POA: Diagnosis present

## 2023-10-20 DIAGNOSIS — J449 Chronic obstructive pulmonary disease, unspecified: Secondary | ICD-10-CM | POA: Diagnosis present

## 2023-10-20 DIAGNOSIS — I120 Hypertensive chronic kidney disease with stage 5 chronic kidney disease or end stage renal disease: Secondary | ICD-10-CM | POA: Diagnosis present

## 2023-10-20 DIAGNOSIS — Z9001 Acquired absence of eye: Secondary | ICD-10-CM

## 2023-10-20 DIAGNOSIS — E785 Hyperlipidemia, unspecified: Secondary | ICD-10-CM | POA: Diagnosis present

## 2023-10-20 DIAGNOSIS — Z992 Dependence on renal dialysis: Secondary | ICD-10-CM

## 2023-10-20 DIAGNOSIS — H547 Unspecified visual loss: Secondary | ICD-10-CM

## 2023-10-20 DIAGNOSIS — N2581 Secondary hyperparathyroidism of renal origin: Secondary | ICD-10-CM | POA: Diagnosis present

## 2023-10-20 DIAGNOSIS — H543 Unqualified visual loss, both eyes: Secondary | ICD-10-CM | POA: Diagnosis present

## 2023-10-20 DIAGNOSIS — G4733 Obstructive sleep apnea (adult) (pediatric): Secondary | ICD-10-CM

## 2023-10-20 DIAGNOSIS — F410 Panic disorder [episodic paroxysmal anxiety] without agoraphobia: Secondary | ICD-10-CM | POA: Diagnosis present

## 2023-10-20 DIAGNOSIS — F319 Bipolar disorder, unspecified: Secondary | ICD-10-CM | POA: Diagnosis present

## 2023-10-20 DIAGNOSIS — Z9049 Acquired absence of other specified parts of digestive tract: Secondary | ICD-10-CM

## 2023-10-20 DIAGNOSIS — E66812 Obesity, class 2: Secondary | ICD-10-CM | POA: Diagnosis present

## 2023-10-20 DIAGNOSIS — D61818 Other pancytopenia: Secondary | ICD-10-CM | POA: Insufficient documentation

## 2023-10-20 DIAGNOSIS — Z79899 Other long term (current) drug therapy: Secondary | ICD-10-CM

## 2023-10-20 DIAGNOSIS — L089 Local infection of the skin and subcutaneous tissue, unspecified: Secondary | ICD-10-CM | POA: Diagnosis present

## 2023-10-20 DIAGNOSIS — Z7901 Long term (current) use of anticoagulants: Secondary | ICD-10-CM

## 2023-10-20 DIAGNOSIS — R1030 Lower abdominal pain, unspecified: Principal | ICD-10-CM

## 2023-10-20 DIAGNOSIS — Z6841 Body Mass Index (BMI) 40.0 and over, adult: Secondary | ICD-10-CM

## 2023-10-20 DIAGNOSIS — Z885 Allergy status to narcotic agent status: Secondary | ICD-10-CM

## 2023-10-20 DIAGNOSIS — G8929 Other chronic pain: Secondary | ICD-10-CM | POA: Diagnosis present

## 2023-10-20 DIAGNOSIS — Z91048 Other nonmedicinal substance allergy status: Secondary | ICD-10-CM

## 2023-10-20 DIAGNOSIS — F17210 Nicotine dependence, cigarettes, uncomplicated: Secondary | ICD-10-CM | POA: Diagnosis present

## 2023-10-20 LAB — COMPREHENSIVE METABOLIC PANEL
ALT: 9 U/L (ref 0–44)
AST: 11 U/L — ABNORMAL LOW (ref 15–41)
Albumin: 3.9 g/dL (ref 3.5–5.0)
Alkaline Phosphatase: 126 U/L (ref 38–126)
Anion gap: 18 — ABNORMAL HIGH (ref 5–15)
BUN: 35 mg/dL — ABNORMAL HIGH (ref 6–20)
CO2: 22 mmol/L (ref 22–32)
Calcium: 9.7 mg/dL (ref 8.9–10.3)
Chloride: 103 mmol/L (ref 98–111)
Creatinine, Ser: 11.55 mg/dL — ABNORMAL HIGH (ref 0.44–1.00)
GFR, Estimated: 4 mL/min — ABNORMAL LOW (ref >60)
Glucose, Bld: 138 mg/dL — ABNORMAL HIGH (ref 70–99)
Potassium: 4 mmol/L (ref 3.5–5.1)
Sodium: 143 mmol/L (ref 135–145)
Total Bilirubin: 1.1 mg/dL (ref 0.0–1.2)
Total Protein: 7.4 g/dL (ref 6.5–8.1)

## 2023-10-20 LAB — APTT: aPTT: 28 s (ref 24–36)

## 2023-10-20 LAB — CBC
HCT: 30.3 % — ABNORMAL LOW (ref 36.0–46.0)
Hemoglobin: 9.2 g/dL — ABNORMAL LOW (ref 12.0–15.0)
MCH: 28.2 pg (ref 26.0–34.0)
MCHC: 30.4 g/dL (ref 30.0–36.0)
MCV: 92.9 fL (ref 80.0–100.0)
Platelets: 107 10*3/uL — ABNORMAL LOW (ref 150–400)
RBC: 3.26 MIL/uL — ABNORMAL LOW (ref 3.87–5.11)
RDW: 18.8 % — ABNORMAL HIGH (ref 11.5–15.5)
WBC: 3.3 10*3/uL — ABNORMAL LOW (ref 4.0–10.5)
nRBC: 0 % (ref 0.0–0.2)

## 2023-10-20 LAB — PROTIME-INR
INR: 1.3 — ABNORMAL HIGH (ref 0.8–1.2)
Prothrombin Time: 16.8 s — ABNORMAL HIGH (ref 11.4–15.2)

## 2023-10-20 LAB — LIPASE, BLOOD: Lipase: 27 U/L (ref 11–51)

## 2023-10-20 LAB — TROPONIN I (HIGH SENSITIVITY): Troponin I (High Sensitivity): 12 ng/L (ref <18)

## 2023-10-20 LAB — GLUCOSE, CAPILLARY: Glucose-Capillary: 124 mg/dL — ABNORMAL HIGH (ref 70–99)

## 2023-10-20 MED ORDER — CINACALCET HCL 30 MG PO TABS
60.0000 mg | ORAL_TABLET | Freq: Every day | ORAL | Status: DC
  Administered 2023-10-20: 60 mg via ORAL
  Filled 2023-10-20 (×2): qty 2

## 2023-10-20 MED ORDER — HYDROCODONE-ACETAMINOPHEN 10-325 MG PO TABS
1.0000 | ORAL_TABLET | ORAL | Status: DC | PRN
  Administered 2023-10-20: 1 via ORAL
  Administered 2023-10-21 – 2023-10-22 (×3): 2 via ORAL
  Administered 2023-10-22: 1 via ORAL
  Filled 2023-10-20: qty 2
  Filled 2023-10-20: qty 1
  Filled 2023-10-20 (×2): qty 2
  Filled 2023-10-20: qty 1

## 2023-10-20 MED ORDER — HEPARIN SODIUM (PORCINE) 5000 UNIT/ML IJ SOLN: 5000.0000 [IU] | Freq: Three times a day (TID) | INTRAMUSCULAR | Status: DC

## 2023-10-20 MED ORDER — ONDANSETRON HCL 4 MG/2ML IJ SOLN
4.0000 mg | Freq: Four times a day (QID) | INTRAMUSCULAR | Status: DC | PRN
  Administered 2023-10-20 – 2023-10-22 (×4): 4 mg via INTRAVENOUS
  Filled 2023-10-20 (×4): qty 2

## 2023-10-20 MED ORDER — LIDOCAINE HCL (PF) 1 % IJ SOLN: 5.0000 mL | INTRAMUSCULAR | Status: DC | PRN

## 2023-10-20 MED ORDER — PAROXETINE HCL 20 MG PO TABS
20.0000 mg | ORAL_TABLET | Freq: Every day | ORAL | Status: DC
  Administered 2023-10-20 – 2023-10-22 (×3): 20 mg via ORAL
  Filled 2023-10-20 (×3): qty 1

## 2023-10-20 MED ORDER — ACETAMINOPHEN 325 MG PO TABS: 650.0000 mg | ORAL_TABLET | Freq: Four times a day (QID) | ORAL | Status: DC | PRN

## 2023-10-20 MED ORDER — HYDROMORPHONE HCL 1 MG/ML IJ SOLN
1.0000 mg | Freq: Once | INTRAMUSCULAR | Status: AC
  Administered 2023-10-20: 1 mg via INTRAVENOUS
  Filled 2023-10-20: qty 1

## 2023-10-20 MED ORDER — MOMETASONE FURO-FORMOTEROL FUM 200-5 MCG/ACT IN AERO
2.0000 | INHALATION_SPRAY | Freq: Two times a day (BID) | RESPIRATORY_TRACT | Status: DC
  Administered 2023-10-20 – 2023-10-23 (×6): 2 via RESPIRATORY_TRACT
  Filled 2023-10-20: qty 8.8

## 2023-10-20 MED ORDER — BUSPIRONE HCL 5 MG PO TABS
7.5000 mg | ORAL_TABLET | Freq: Two times a day (BID) | ORAL | Status: DC
  Administered 2023-10-20 – 2023-10-22 (×5): 7.5 mg via ORAL
  Filled 2023-10-20 (×6): qty 2

## 2023-10-20 MED ORDER — DOXERCALCIFEROL 4 MCG/2ML IV SOLN: 1.0000 ug | INTRAVENOUS | Status: DC

## 2023-10-20 MED ORDER — ONDANSETRON HCL 4 MG/2ML IJ SOLN
4.0000 mg | Freq: Once | INTRAMUSCULAR | Status: AC
  Administered 2023-10-20: 4 mg via INTRAVENOUS
  Filled 2023-10-20: qty 2

## 2023-10-20 MED ORDER — HEPARIN BOLUS VIA INFUSION
4000.0000 [IU] | Freq: Once | INTRAVENOUS | Status: AC
  Administered 2023-10-21: 4000 [IU] via INTRAVENOUS
  Filled 2023-10-20: qty 4000

## 2023-10-20 MED ORDER — INSULIN ASPART 100 UNIT/ML IJ SOLN: 0.0000 [IU] | Freq: Every day | INTRAMUSCULAR | Status: DC

## 2023-10-20 MED ORDER — ALBUTEROL SULFATE (2.5 MG/3ML) 0.083% IN NEBU: 2.5000 mg | INHALATION_SOLUTION | Freq: Two times a day (BID) | RESPIRATORY_TRACT | Status: DC | PRN

## 2023-10-20 MED ORDER — HEPARIN (PORCINE) 25000 UT/250ML-% IV SOLN
1900.0000 [IU]/h | INTRAVENOUS | Status: DC
  Administered 2023-10-21: 1800 [IU]/h via INTRAVENOUS
  Administered 2023-10-21: 1500 [IU]/h via INTRAVENOUS
  Administered 2023-10-22: 1900 [IU]/h via INTRAVENOUS
  Filled 2023-10-20 (×3): qty 250

## 2023-10-20 MED ORDER — ACETAMINOPHEN 650 MG RE SUPP: 650.0000 mg | Freq: Four times a day (QID) | RECTAL | Status: DC | PRN

## 2023-10-20 MED ORDER — IOHEXOL 300 MG/ML  SOLN
100.0000 mL | Freq: Once | INTRAMUSCULAR | Status: AC | PRN
  Administered 2023-10-20: 100 mL via INTRAVENOUS

## 2023-10-20 MED ORDER — SUCROFERRIC OXYHYDROXIDE 500 MG PO CHEW
1000.0000 mg | CHEWABLE_TABLET | Freq: Three times a day (TID) | ORAL | Status: DC
  Administered 2023-10-21 – 2023-10-22 (×4): 1000 mg via ORAL
  Filled 2023-10-20 (×11): qty 2

## 2023-10-20 MED ORDER — GABAPENTIN 300 MG PO CAPS
300.0000 mg | ORAL_CAPSULE | Freq: Every day | ORAL | Status: DC
  Administered 2023-10-20 – 2023-10-22 (×3): 300 mg via ORAL
  Filled 2023-10-20 (×3): qty 1

## 2023-10-20 MED ORDER — INSULIN GLARGINE-YFGN 100 UNIT/ML ~~LOC~~ SOLN
30.0000 [IU] | Freq: Every day | SUBCUTANEOUS | Status: DC
  Administered 2023-10-20 – 2023-10-22 (×3): 30 [IU] via SUBCUTANEOUS
  Filled 2023-10-20: qty 10
  Filled 2023-10-20 (×4): qty 0.3

## 2023-10-20 MED ORDER — ATROPINE SULFATE 1 % OP SOLN
1.0000 [drp] | Freq: Three times a day (TID) | OPHTHALMIC | Status: DC
  Administered 2023-10-20 – 2023-10-22 (×6): 1 [drp] via OPHTHALMIC
  Filled 2023-10-20 (×2): qty 5

## 2023-10-20 MED ORDER — BRIMONIDINE TARTRATE 0.2 % OP SOLN
1.0000 [drp] | Freq: Three times a day (TID) | OPHTHALMIC | Status: DC
  Administered 2023-10-20 – 2023-10-22 (×6): 1 [drp] via OPHTHALMIC
  Filled 2023-10-20 (×3): qty 5

## 2023-10-20 MED ORDER — HYDROMORPHONE HCL 1 MG/ML IJ SOLN: 0.5000 mg | INTRAMUSCULAR | Status: DC | PRN

## 2023-10-20 MED ORDER — SENNOSIDES-DOCUSATE SODIUM 8.6-50 MG PO TABS
2.0000 | ORAL_TABLET | Freq: Two times a day (BID) | ORAL | Status: DC
  Filled 2023-10-20 (×2): qty 2

## 2023-10-20 MED ORDER — ATORVASTATIN CALCIUM 20 MG PO TABS
20.0000 mg | ORAL_TABLET | Freq: Every day | ORAL | Status: DC
  Administered 2023-10-20 – 2023-10-22 (×2): 20 mg via ORAL
  Filled 2023-10-20 (×4): qty 1

## 2023-10-20 MED ORDER — POLYVINYL ALCOHOL 1.4 % OP SOLN: 1.0000 [drp] | OPHTHALMIC | Status: DC | PRN

## 2023-10-20 MED ORDER — VANCOMYCIN HCL 2000 MG/400ML IV SOLN
2000.0000 mg | Freq: Once | INTRAVENOUS | Status: DC
  Filled 2023-10-20: qty 400

## 2023-10-20 MED ORDER — POLYETHYLENE GLYCOL 3350 17 G PO PACK: 17.0000 g | PACK | Freq: Every day | ORAL | Status: DC | PRN

## 2023-10-20 MED ORDER — ALPRAZOLAM 0.5 MG PO TABS
0.5000 mg | ORAL_TABLET | Freq: Four times a day (QID) | ORAL | Status: DC | PRN
  Administered 2023-10-20 – 2023-10-22 (×5): 0.5 mg via ORAL
  Filled 2023-10-20 (×6): qty 1

## 2023-10-20 MED ORDER — LATANOPROST 0.005 % OP SOLN
1.0000 [drp] | Freq: Every day | OPHTHALMIC | Status: DC
  Filled 2023-10-20 (×2): qty 2.5

## 2023-10-20 MED ORDER — PANTOPRAZOLE SODIUM 40 MG IV SOLR
40.0000 mg | INTRAVENOUS | Status: DC
  Administered 2023-10-20 – 2023-10-22 (×3): 40 mg via INTRAVENOUS
  Filled 2023-10-20 (×3): qty 10

## 2023-10-20 MED ORDER — SODIUM CHLORIDE 0.9% FLUSH
3.0000 mL | Freq: Two times a day (BID) | INTRAVENOUS | Status: DC
  Administered 2023-10-20 – 2023-10-22 (×5): 3 mL via INTRAVENOUS

## 2023-10-20 MED ORDER — INSULIN ASPART 100 UNIT/ML IJ SOLN
0.0000 [IU] | Freq: Three times a day (TID) | INTRAMUSCULAR | Status: DC
  Administered 2023-10-21: 2 [IU] via SUBCUTANEOUS

## 2023-10-20 NOTE — Progress Notes (Signed)
   10/20/23 2249  TOC Brief Assessment  Insurance and Status Reviewed  Patient has primary care physician Yes  Home environment has been reviewed From home c/aide.  Prior level of function: Assisted.  Prior/Current Home Services Current home services ([facility])  Social Drivers of Health Review SDOH reviewed interventions complete (smoking cessation added to AVS)  Readmission risk has been reviewed Yes  Transition of care needs transition of care needs identified, TOC will continue to follow (needs raised toilet seat)   Pt admitted for obs c/nausea/vomiting. Pt is blind, from home w/[facility] who assists c/baths, meals, housekeeping, errands, 50 hrs/wk. Pt plans to dc home.

## 2023-10-20 NOTE — Assessment & Plan Note (Signed)
 Chrnoic mild stable. Check b12 and foalte.

## 2023-10-20 NOTE — Assessment & Plan Note (Signed)
 Patient is blind in both eyes with the left palpebral irritation/chronic injury due to rubbing.  Patient uses several eyedrops I believe for comfort including artificial tears treatment atropine brimonidine and latanoprost which will be continued.  I will request a wound care input to see if they can cover the left eye for the patient and promote healing.

## 2023-10-20 NOTE — Assessment & Plan Note (Signed)
 Reports chronic diarrhea has been using colchicine daily.  Hold colchicine at this time.  I do not see indication for colchicine.  Will monitor.

## 2023-10-20 NOTE — Assessment & Plan Note (Signed)
 Continue with statin

## 2023-10-20 NOTE — ED Provider Notes (Signed)
 [place] EMERGENCY DEPARTMENT AT [facility] Provider Note   CSN: [id number] Arrival date & time: 10/20/23  1155     History  Chief Complaint  Patient presents with   Abdominal Pain    Martha [name] is a 58 y.o. female with a history including end-stage renal disease, dialysis Monday Wednesday Friday, missed yesterday's treatment secondary to escalation of her now chronic abdominal pain which she describes as the entirety of her left abdomen along with nausea and vomiting which has been present since her last evaluation here approximately 3 weeks ago when she was diagnosed with a splenic infarct.  Unfortunately the patient left AMA prior to being admitted.  She states she has had persistent pain which is escalated over the past 3 to 4 days since that evaluation.  She denies fevers or chills, her pain is severe, worse when she lies down, better sitting up.  She also endorses having a panic attack for which she routinely takes Xanax.  No known fevers.  Patient does make a small amount of  urine.  Also reports chronic diarrhea,  nonbloody, 4-5 episodes daily since starting dialysis years ago, not worsened today.   The history is provided by the patient and medical records.       Home Medications Prior to Admission medications   Medication Sig Start Date End Date Taking? Authorizing Provider  ADVAIR DISKUS 250-50 MCG/ACT AEPB Inhale 1 puff into the lungs 2 (two) times daily. 07/01/22   [provider]  albuterol (PROVENTIL) (2.5 MG/3ML) 0.083% nebulizer solution Take 3 mLs (2.5 mg total) by nebulization every 12 (twelve) hours as needed for wheezing or shortness of breath. 08/05/22   Valentino Nose, NP  ALPRAZolam Prudy Feeler) 1 MG tablet Take 1 tablet (1 mg total) by mouth 4 (four) times daily as needed for anxiety. Patient taking differently: Take 1 mg by mouth 4 (four) times daily. 07/12/20   Rhetta Mura, MD  Artificial Tear Ointment (DRY EYES OP) Place 1 drop  into the right eye 2 (two) times daily.    [provider]  atorvastatin (LIPITOR) 20 MG tablet Take by mouth. 10/21/18   [provider]  atropine (ISOPTO ATROPINE) 1 % ophthalmic solution Apply to eye. 12/27/19   [provider]  benzonatate (TESSALON) 200 MG capsule Take 1 capsule (200 mg total) by mouth 3 (three) times daily. 08/30/23   Kendell Bane, MD  Blood Glucose Monitoring Suppl (FORA V30A BLOOD GLUCOSE SYSTEM) DEVI  09/22/20   [provider]  brimonidine (ALPHAGAN) 0.2 % ophthalmic solution PLACE 1 DROP INTO THE RIGHT EYE 3 TIMES A DAY. 05/24/21   [provider]  busPIRone (BUSPAR) 7.5 MG tablet Take 1 tablet (7.5 mg total) by mouth 2 (two) times daily. 07/12/20   Rhetta Mura, MD  calcium carbonate (TUMS EX) 750 MG chewable tablet Chew 1-3 tablets by mouth See admin instructions. Take 3 tablets with each meal & take 1 tablets with each snack    [provider]  cinacalcet (SENSIPAR) 60 MG tablet Take 60 mg by mouth daily.    [provider]  colchicine 0.6 MG tablet TAKE 1 TABLET(0.6 MG) BY MOUTH DAILY 06/04/23   Felecia Shelling, DPM  doxercalciferol (HECTOROL) 4 MCG/2ML injection Inject 0.5 mLs (1 mcg total) into the vein every Monday, Wednesday, and Friday with hemodialysis. 12/03/18   Kari Baars, MD  FORACARE PREMIUM V10 TEST test strip  09/22/20   [provider]  gabapentin (NEURONTIN)  100 MG capsule Take 1 capsule (100 mg total) by mouth at bedtime. Patient taking differently: Take 300 mg by mouth at bedtime. 07/12/20   Rhetta Mura, MD  HYDROcodone-acetaminophen (NORCO) 10-325 MG tablet Take 1 tablet by mouth 4 (four) times daily. 08/30/20   [provider]  insulin glargine (LANTUS SOLOSTAR) 100 UNIT/ML Solostar Pen Inject 30 Units into the skin daily.    [provider]  latanoprost (XALATAN) 0.005 % ophthalmic solution Place 1 drop into the right eye 2 (two) times daily.  08/22/20   [provider]  lidocaine-prilocaine (EMLA) cream Apply 1 application topically as needed (topical anesthesia for hemodialysis if Gebauers and Lidocaine injection are ineffective.). Patient taking differently: Apply 1 application  topically daily as needed (dialysis.). 04/25/17   Johnson, Clanford L, MD  omeprazole (PRILOSEC) 40 MG capsule Take 40 mg by mouth daily.    [provider]  ondansetron (ZOFRAN) 4 MG tablet TAKE (1) TABLET THREE TIMES DAILY AS NEEDED FOR NAUSEA. 07/30/22   [provider]  PARoxetine (PAXIL) 20 MG tablet Take 1 tablet (20 mg total) by mouth every evening. This is to prevent panic attacks Patient taking differently: Take 20 mg by mouth at bedtime. 07/12/20   Rhetta Mura, MD  VELPHORO 500 MG chewable tablet Chew 1,000 mg by mouth 3 (three) times daily. 08/05/23   [provider]      Allergies    Codeine and Tape    Review of Systems   Review of Systems  Constitutional:  Negative for chills and fever.  HENT:  Negative for congestion and sore throat.   Eyes: Negative.   Respiratory:  Negative for chest tightness and shortness of breath.   Cardiovascular:  Negative for chest pain.  Gastrointestinal:  Positive for abdominal pain, diarrhea, nausea and vomiting.  Genitourinary: Negative.   Musculoskeletal:  Negative for arthralgias, joint swelling and neck pain.  Skin: Negative.  Negative for rash and wound.  Neurological:  Negative for dizziness, weakness, light-headedness, numbness and headaches.  Psychiatric/Behavioral:  The patient is nervous/anxious.     Physical Exam Updated Vital Signs BP 131/64   Pulse 61   Temp 97.7 F (36.5 C) (Oral)   Resp 18   Ht 5\' 7"  (1.702 m)   Wt 117.9 kg   LMP 08/03/2008 (Exact Date)   SpO2 94%   BMI 40.72 kg/m  Physical Exam Vitals and nursing note reviewed.  Constitutional:      General: She is in acute distress.     Appearance: She is well-developed.     Comments:  Anxious appearing  HENT:     Head: Normocephalic and atraumatic.  Eyes:     Conjunctiva/sclera: Conjunctivae normal.  Cardiovascular:     Rate and Rhythm: Normal rate and regular rhythm.     Heart sounds: Normal heart sounds.  Pulmonary:     Effort: Pulmonary effort is normal.     Breath sounds: Normal breath sounds. No wheezing.  Abdominal:     General: Abdomen is protuberant. Bowel sounds are normal.     Palpations: Abdomen is soft.     Tenderness: There is generalized abdominal tenderness and tenderness in the suprapubic area, left upper quadrant and left lower quadrant. There is no guarding.  Musculoskeletal:        General: Normal range of motion.     Cervical back: Normal range of motion.  Skin:    General: Skin is warm and dry.  Neurological:     Mental Status:  She is alert and oriented to person, place, and time.  Psychiatric:        Mood and Affect: Mood is anxious.     ED Results / Procedures / Treatments   Labs (all labs ordered are listed, but only abnormal results are displayed) Labs Reviewed  COMPREHENSIVE METABOLIC PANEL - Abnormal; Notable for the following components:      Result Value   Glucose, Bld 138 (*)    BUN 35 (*)    Creatinine, Ser 11.55 (*)    AST 11 (*)    GFR, Estimated 4 (*)    Anion gap 18 (*)    All other components within normal limits  CBC - Abnormal; Notable for the following components:   WBC 3.3 (*)    RBC 3.26 (*)    Hemoglobin 9.2 (*)    HCT 30.3 (*)    RDW 18.8 (*)    Platelets 107 (*)    All other components within normal limits  LIPASE, BLOOD  URINALYSIS, ROUTINE W REFLEX MICROSCOPIC  APTT  PROTIME-INR  BASIC METABOLIC PANEL  CBC    EKG None  Radiology CT ABDOMEN PELVIS W CONTRAST Result Date: 10/20/2023 CLINICAL DATA:  Abdominal pain. EXAM: CT ABDOMEN AND PELVIS WITH CONTRAST TECHNIQUE: Multidetector CT imaging of the abdomen and pelvis was performed using the standard protocol following bolus administration of  intravenous contrast. RADIATION DOSE REDUCTION: This exam was performed according to the departmental dose-optimization program which includes automated exposure control, adjustment of the mA and/or kV according to patient size and/or use of iterative reconstruction technique. CONTRAST:  OMNIPAQUE IOHEXOL 300 MG/ML  SOLN COMPARISON:  CT abdomen pelvis dated 09/21/2023. FINDINGS: Lower chest: The visualized lung bases are clear. There is coronary vascular calcification. No intra-abdominal free air or free fluid. Hepatobiliary: The liver is unremarkable. No biliary dilatation. Cholecystectomy. No retained calcified stone noted in the central CBD. Pancreas: Unremarkable. No pancreatic ductal dilatation or surrounding inflammatory changes. Spleen: Splenomegaly measuring 17 cm in length. Which shaped peripheral hypoenhancement in the midportion of the spleen most consistent with an infarct and seen on the prior CT. Adrenals/Urinary Tract: The adrenal glands are unremarkable. Severe bilateral renal parenchyma atrophy. No hydronephrosis. The visualized ureters appear unremarkable. The urinary bladder is minimally distended. Stomach/Bowel: There is no bowel obstruction or active inflammation. The appendix is normal. Vascular/Lymphatic: Mild aortoiliac atherosclerotic disease. The IVC is unremarkable. No portal venous gas. Diffuse calcification of the mesenteric vessels. No adenopathy. Reproductive: The uterus is anteverted and grossly unremarkable. No suspicious adnexal masses. Other: There is diffuse skin thickening and subcutaneous edema of the anterior pelvic wall. No fluid collection. Musculoskeletal: Osteopenia with degenerative changes and findings of renal osteodystrophy. Fragmentation of the pubic symphysis as seen previously may represent osteomyelitis. Clinical correlation is recommended. No acute osseous pathology. IMPRESSION: 1. No bowel obstruction. Normal appendix. 2. Possible cellulitis of the anterior  pelvic wall. No abscess or soft tissue gas. 3. Fragmentation of the pubic symphysis as seen previously may represent osteomyelitis. 4. Splenomegaly with a mid splenic infarct as seen previously. 5. Severe bilateral renal parenchyma atrophy. No hydronephrosis. Electronically Signed   By: Elgie Collard M.D.   On: 10/20/2023 16:00    Procedures Procedures    Medications Ordered in ED Medications  lidocaine (PF) (XYLOCAINE) 1 % injection 5 mL (has no administration in time range)  acetaminophen (TYLENOL) tablet 650 mg (has no administration in time range)    Or  acetaminophen (TYLENOL) suppository 650 mg (has no  administration in time range)  polyethylene glycol (MIRALAX / GLYCOLAX) packet 17 g (has no administration in time range)  heparin injection 5,000 Units (has no administration in time range)  sodium chloride flush (NS) 0.9 % injection 3 mL (has no administration in time range)  ondansetron (ZOFRAN) injection 4 mg (has no administration in time range)  HYDROmorphone (DILAUDID) injection 0.5 mg (has no administration in time range)  HYDROmorphone (DILAUDID) injection 1 mg (1 mg Intravenous Given 10/20/23 1253)  ondansetron (ZOFRAN) injection 4 mg (4 mg Intravenous Given 10/20/23 1253)  iohexol (OMNIPAQUE) 300 MG/ML solution 100 mL (100 mLs Intravenous Contrast Given 10/20/23 1414)  HYDROmorphone (DILAUDID) injection 1 mg (1 mg Intravenous Given 10/20/23 1758)    ED Course/ Medical Decision Making/ A&P Clinical Course as of 10/20/23 1820  Tue Oct 20, 2023  1726 CT imaging confirms persistent splenic infarct as known.  There is also concern about lower abdominal wall cellulitis, it reexam there is no obvious cellulitis, no erythema.  She does have nodular densities in her subcu tissues which she states are secondary to phosphorus deposits.  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  Call placed  to radiologist who recommends MRI with contrast for definitive diagnosis versus bone scan. [JI]    Clinical Course User Index [JI] Burgess Amor, PA-C                                 Medical Decision Making Amount and/or Complexity of Data Reviewed Labs: ordered.    Details: C-Met reviewed, glucose 138, creatinine is 11.55 consistent with her renal failure.  Her chloride and her potassium levels are normal range.  CBC stable, mild leukocytosis with a WBC count of 3.3, her hemoglobin is 9.2 which is stable. Radiology: ordered.    Details: Ct reviewed, cellulitis of the anterior pelvic wall possible, but no exam findings to suggest this.  She is tender in her suprapubic area which does raise the concern about CT findings suggesting possibility of pubic symphysis osteomyelitis. Splenic infarct as previously known. Discussion of management or test interpretation with external provider(s): Discussed CT results with Dr. Gwenyth Bender of radiology, recommends MRI with contrast or nuclear med bone scan to definitively diagnose osteomyelitis at the pubic symphysis.  Patient discussed with Dr. Glenna Fellows of nephrology who will arrange dialysis while patient is admitted.  Also spoke with Dr. Maryjean Ka of the hospitalist service who accepts patient for admission.  Risk Prescription drug management. Decision regarding hospitalization.           Final Clinical Impression(s) / ED Diagnoses Final diagnoses:  Lower abdominal pain  Splenic infarct  Nausea vomiting and diarrhea    Rx / DC Orders ED Discharge Orders     None         Victoriano Lain 10/20/23 1844    Derwood Kaplan, MD 10/21/23 1009

## 2023-10-20 NOTE — Assessment & Plan Note (Addendum)
 Reported to me by patient.  And I do see on the CAT scan that the patient does have calcification of her blood vessels.  Continue with Sensipar as well as Hectorol.  Check phosphate in the morning. C.w. velphoro

## 2023-10-20 NOTE — Assessment & Plan Note (Addendum)
 See HPI, seems to be chronic, focused around the left upper quadrant at this time.  Patient does have a splenic infarct.  Again which is previously known. Review of imaging shows prominent calcification of splenic artery.  At this time I will draw blood cultures to make sure were not missing a septic embolus.  Maintain the patient on telemetry to make sure were not missing atrial fibrillation and start the patient on heparin infusion.  CAT scan report of possible abdominal wall cellulitis is noted.  However clinically speaking there is no infection at this time at the site.  There is nodularity under the skin. C/w patinet report of calciphylaxis. Patient pubic bone fragmentation more likely represent renal osteodystrophy.   Is using hydrocodone with acetaminophen at home with less than optimal pain control.  I will increase patient's dose to 1 to 2 tablets for moderate to severe pain respectively every 4 hours as needed.

## 2023-10-20 NOTE — ED Notes (Signed)
 Pt to CT

## 2023-10-20 NOTE — Assessment & Plan Note (Addendum)
 Patient is using Xanax 1 mg every 6 hours at home.  I think this will need to be tapered off.  I will reduce it to half a milligram every 6 hours as needed. C/w buspar. Continue with Paxil

## 2023-10-20 NOTE — ED Triage Notes (Signed)
 Pt to ED via [facility] c/o lower  abdominal pain and vomiting x 2 weeks. Dialysis pt , received last treatment on Friday.Missed Dialysis yesterday for not feeling well.   Last VS: 167/66, P 56. RrR2, 97%RA   No medications given by EMS

## 2023-10-20 NOTE — Progress Notes (Signed)
 PHARMACY - ANTICOAGULATION CONSULT NOTE  Pharmacy Consult for Heparin Indication:  splenic infarction  Allergies  Allergen Reactions   Codeine Nausea And Vomiting   Tape Other (See Comments) and Rash    Pull skin off.  Paper tape is ok   Patient Measurements: Height: 5\' 7"  (170.2 cm) Weight: 117.9 kg (260 lb) IBW/kg (Calculated) : 61.6 Heparin Dosing Weight: 89.3 kg   Vital Signs: Temp: 97.7 F (36.5 C) (03/25 1859) Temp Source: Oral (03/25 1859) BP: 151/58 (03/25 1859) Pulse Rate: 64 (03/25 1859)  Labs: Recent Labs    10/20/23 1208  HGB 9.2*  HCT 30.3*  PLT 107*  CREATININE 11.55*   Estimated Creatinine Clearance: 7.3 mL/min (A) (by C-G formula based on SCr of 11.55 mg/dL (H)).  Medical History: Past Medical History:  Diagnosis Date   Acid reflux    takes Tums   Anemia    Arthritis    Bipolar 1 disorder (HCC)    Blindness of left eye    left eye removed   Carpal tunnel syndrome, bilateral    Cervical radiculopathy    CHF (congestive heart failure) (HCC)    Chronic kidney disease    Stage 5- 01/25/17   Constipation    COPD (chronic obstructive pulmonary disease) (HCC) 2014   bronchitis   Degenerative disc disease, thoracic    Depression    Diabetes mellitus    Type II   Diabetic retinopathy (HCC)    Dyspnea    when walking   End stage kidney disease (HCC)    M, W, F [facility]   Gout    Hypertension    Non-alcoholic cirrhosis (HCC)    Noncompliance with medication regimen    Noncompliance with medication regimen    Obesity (BMI 30-39.9)    OSA (obstructive sleep apnea)    cpap   Panic attack    RLS (restless legs syndrome)    Medications:  No PTA anticoagulation per chart review  Baseline Labs: Hgb 9.2 Plt 107 INR/PT and aPTT - ordered   Assessment: MB is a 58 yo female who presented with chronic 2-3 loose water bowel movements at home. They had an acute onset of abdominal pain, severe in nature. Missed their HD appointment  yesterday. They were seen for a similar syndrome on 09/22/2023 and diagnosed with splenic infarction, then left AMA. Pharmacy has been consulted to manage this patient's heparin.   Goal of Therapy:  Heparin level 0.3-0.7 units/ml Monitor platelets by anticoagulation protocol: Yes   Plan:  -- Heparin bolus gtt 4,000 units x 1 -- Start heparin infusion at 1,500 units/hr -- Follow-up with heparin levels in 8 hours -- Continue for follow CBC with AM labs  Thank you for allowing pharmacy to participate in this patient's care.   Effie Shy, PharmD Pharmacy Resident  10/20/2023 8:36 PM

## 2023-10-20 NOTE — Consult Note (Addendum)
 Pharmacy Antibiotic Note  Martha [name] is a 58 y.o. female admitted on 10/20/2023 with  Osteomyelitis . They presented with chronic abdominal pain which is described as the entirety of her left abdomen along with nausea and vomiting. Pharmacy has been consulted for Vancomycin dosing.  Today, 10/20/2023 Patient is ESRD on MWF (missed yesterday's treatment) WBC 3.3  Temperature 97.7 F Imaging: No bowel obstruction, fragmentation of the public symphysis as seen previously may represent osteomyelitis.   Plan: Gave Vancomycin 2g IV loading dose  0.9*(84kg)*25 = 1,890 --> 2g  Used adjusted body weight Follow for when patient receives dialysis for next vancomycin dose Recommend 1000 mg post HD for >80 kg No cultures collected at the moment, follow if able to obtain blood cultures  Height: 5\' 7"  (170.2 cm) Weight: 117.9 kg (260 lb) IBW/kg (Calculated) : 61.6  Temp (24hrs), Avg:97.7 F (36.5 C), Min:97.7 F (36.5 C), Max:97.7 F (36.5 C)  Recent Labs  Lab 10/20/23 1208  WBC 3.3*  CREATININE 11.55*    Estimated Creatinine Clearance: 7.3 mL/min (A) (by C-G formula based on SCr of 11.55 mg/dL (H)).    Allergies  Allergen Reactions   Codeine Nausea And Vomiting   Tape Other (See Comments) and Rash    Pull skin off.  Paper tape is ok   Antimicrobials this admission: 3/25 Vancomycin >>   Dose adjustments this admission: NA  Microbiology results: No culture data at this time  Thank you for allowing pharmacy to be a part of this patient's care.  Effie Shy, PharmD Pharmacy Resident  10/20/2023 7:29 PM  Addendum Vancomycin consult cancelled  Vancomycin dose not given  Effie Shy, PharmD Pharmacy Resident  10/20/2023 8:29 PM

## 2023-10-20 NOTE — Assessment & Plan Note (Signed)
Continue with gabapentin 

## 2023-10-20 NOTE — ED Notes (Signed)
 ED lab unsuccessful at blood cultures

## 2023-10-20 NOTE — Assessment & Plan Note (Addendum)
 See hpi.. Reccurent episodes. Empric rx with pantop. Check stool for h pylori . Drug screen for canabis. Patient elevated Cr. And h/o missed dialysis sessions is concerning for uremic symptoms. Look forward to Renal eval in AM. Vomting is not related to meal or a.w. food aversion to suggest ischemia at this time.  Add on torponin.

## 2023-10-20 NOTE — Assessment & Plan Note (Signed)
 Continue with CPAP.  Advair changed to [facility].  Continue with albuterol Q12 hourly as needed

## 2023-10-20 NOTE — H&P (Addendum)
 History and Physical    Patient: Martha [name]:[id number] DOB: Dec 17, 1965 DOA: 10/20/2023 DOS: the patient was seen and examined on 10/20/2023 PCP: Carmel Sacramento, NP  Patient coming from: Home  Chief Complaint:  Chief Complaint  Patient presents with   Abdominal Pain   HPI: Martha [name] is a 58 y.o. female with medical history significant of end-stage renal disease on dialysis.  Patient reports chronically having 2-3 loose watery loose bowel movements at home.  This has been going on for years apparently.  Patient reports that she was in her usual state of health till about 10 days ago when she reports an insidious onset of left upper quadrant area abdominal pain that was pretty much constant like a knife going in.  It became severe.  Only relieved by pain medication.  It was associated with nausea and vomiting.  Vomiting up to 2 episodes a day.  Relieved by nausea medication.  Denies any fever or rigors any change in her diarrhea.  Patient miseed HD yesteray due to her seveirty of symptoms. Returns to ER.  Patient was actually seen seen for similar syndrom on around 09/22/2023 - diagnosed with splenic infarction and seems to have lft ama the next day.  S/p pain and nausea medication in the ER. Patient reports well controlle dsymptoms at this time.   Review of Systems: As mentioned in the history of present illness. All other systems reviewed and are negative. Past Medical History:  Diagnosis Date   Acid reflux    takes Tums   Anemia    Arthritis    Bipolar 1 disorder (HCC)    Blindness of left eye    left eye removed   Carpal tunnel syndrome, bilateral    Cervical radiculopathy    CHF (congestive heart failure) (HCC)    Chronic kidney disease    Stage 5- 01/25/17   Constipation    COPD (chronic obstructive pulmonary disease) (HCC) 2014   bronchitis   Degenerative disc disease, thoracic    Depression    Diabetes mellitus    Type II   Diabetic retinopathy (HCC)    Dyspnea     when walking   End stage kidney disease (HCC)    M, W, F [facility]   Gout    Hypertension    Non-alcoholic cirrhosis (HCC)    Noncompliance with medication regimen    Noncompliance with medication regimen    Obesity (BMI 30-39.9)    OSA (obstructive sleep apnea)    cpap   Panic attack    RLS (restless legs syndrome)    Past Surgical History:  Procedure Laterality Date   AORTIC ARCH ANGIOGRAPHY N/A 09/11/2020   Procedure: AORTIC ARCH ANGIOGRAPHY;  Surgeon: Nada Libman, MD;  [facility];  Service: Cardiovascular;  Laterality: N/A;   AV FISTULA PLACEMENT Right 01/27/2017   Procedure: ARTERIOVENOUS (AV) FISTULA CREATION-RIGHT ARM;  Surgeon: Sherren Kerns, MD;  Location: [facility] OR;  Service: Vascular;  Laterality: Right;   AV FISTULA PLACEMENT Left 08/03/2018   Procedure: LEFT ARTERIOVENOUS (AV) FISTULA CREATION;  Surgeon: Sherren Kerns, MD;  Location: [facility] OR;  Service: Vascular;  Laterality: Left;   BASCILIC VEIN TRANSPOSITION Left 04/26/2019   Procedure: SECOND STAGE BASILIC VEIN TRANSPOSITION LEFT ARM;  Surgeon: Sherren Kerns, MD;  Location: [facility] OR;  Service: Vascular;  Laterality: Left;   BIOPSY N/A 05/17/2013   Procedure: BIOPSY;  Surgeon: West Bali, MD;  Location: [facility] ORS;  Service:  Endoscopy;  Laterality: N/A;   CARPAL TUNNEL RELEASE Left 11/25/2018   Procedure: LEFT CARPAL TUNNEL RELEASE;  Surgeon: Dairl Ponder, MD;  Location: [facility] OR;  Service: Orthopedics;  Laterality: Left;   CATARACT EXTRACTION Right    CHOLECYSTECTOMY N/A 04/27/2014   Procedure: LAPAROSCOPIC CHOLECYSTECTOMY;  Surgeon: Marlane Hatcher, MD;  Location: [facility] ORS;  Service: General;  Laterality: N/A;   COLONOSCOPY  06/06/2010   [name]:WRUEAV bleeding secondary to internal hemorrhoids but incomplete evaluation secondary to poor right colon prep/small rectal and sigmoid colon polyps (hyperplastic). PROPOFOL   COLONOSCOPY  May 2013   Dr. Gilliam/[facility]: 5 mm a descending colon  polyp, hyperplastic. Adequate bowel prep.   ENDOMETRIAL ABLATION     ENUCLEATION Left 02/04/2018   ENUCLEATION WITH PLACEMENT OF IMPLANT LEFT EYE   ENUCLEATION Left 02/04/2018   Procedure: ENUCLEATION WITH PLACEMENT OF IMPLANT LEFT EYE;  Surgeon: Floydene Flock, MD;  Location: [facility] OR;  Service: Ophthalmology;  Laterality: Left;   ESOPHAGOGASTRODUODENOSCOPY  06/06/2010   WUJ:WJXBJYN erythema and edema of body of stomach, with sessile polypoid lesions. bx benign. no h.pylori   ESOPHAGOGASTRODUODENOSCOPY (EGD) WITH PROPOFOL N/A 05/17/2013   Dr. Darrick Penna: normal esophagus, moderate nodular gastritis, negative path, empiric Savary dilation   EYE SURGERY  07/2017   sx for glaucoma   FLEXIBLE SIGMOIDOSCOPY N/A 05/17/2013   Dr. Darrick Penna: moderate sized internal hemorrhoids   HEMORRHOID BANDING N/A 05/17/2013   Procedure: HEMORRHOID BANDING;  Surgeon: West Bali, MD;  Location: [facility];  Service: Endoscopy;  Laterality: N/A;  2 bands placed   INCISION AND DRAINAGE ABSCESS Left 10/11/2013   Procedure: INCISION AND DRAINAGE AND DEBRIDEMENT LEFT BREAST  ABSCESS;  Surgeon: Marlane Hatcher, MD;  Location: [facility];  Service: General;  Laterality: Left;   INSERTION OF DIALYSIS CATHETER N/A 06/08/2018   Procedure: INSERTION OF Right internal jugular TUNNELED  DIALYSIS CATHETER;  Surgeon: Chuck Hint, MD;  Location: [facility] OR;  Service: Vascular;  Laterality: N/A;   IRRIGATION AND DEBRIDEMENT ABSCESS Right 06/01/2013   Procedure: INCISION AND DRAINAGE AND DEBRIDEMENT ABSCESS RIGHT BREAST;  Surgeon: Marlane Hatcher, MD;  Location: [facility];  Service: General;  Laterality: Right;   LEFT EYE REMOVED Left 01/2018   [facility] [address].   LIGATION OF ARTERIOVENOUS  FISTULA Right 06/08/2018   Procedure: LIGATION OF ARTERIOVENOUS  FISTULA RIGHT ARM;  Surgeon: Chuck Hint, MD;  Location: [facility] OR;  Service: Vascular;  Laterality: Right;   ORIF ANKLE FRACTURE Right 12/17/2018    Procedure: OPEN REDUCTION INTERNAL FIXATION (ORIF) ANKLE FRACTURE;  Surgeon: Felecia Shelling, DPM;  Location: [facility] OR;  Service: Podiatry;  Laterality: Right;   REVISON OF ARTERIOVENOUS FISTULA Left 09/26/2020   Procedure: LEFT ARTERIOVENOUS FISTULA  DISTAL REVASCULARIZATION AND LIGATION ( DRIL ) USING GREATER SAPHENOUS VEIN;  Surgeon: Maeola Harman, MD;  Location: [facility] OR;  Service: Vascular;  Laterality: Left;   SAVORY DILATION N/A 05/17/2013   Procedure: SAVORY DILATION;  Surgeon: West Bali, MD;  Location: [facility];  Service: Endoscopy;  Laterality: N/A;  14/15/16   SKIN FULL THICKNESS GRAFT Left 05/05/2018   Procedure: ABDOMINAL DERMIS FAT SKIN GRAFT FULL THICKNESS LEFT EYE;  Surgeon: Floydene Flock, MD;  Location: [facility] OR;  Service: Ophthalmology;  Laterality: Left;   TUBAL LIGATION     VEIN HARVEST Left 09/26/2020   Procedure: LEFT LEG GREATER SAPHENOUS VEIN HARVEST;  Surgeon: Maeola Harman, MD;  Location: [facility] OR;  Service: Vascular;  Laterality:  Left;   Social History:  reports that she has been smoking cigarettes and e-cigarettes. She started smoking about 41 years ago. She has a 37.5 pack-year smoking history. She has never used smokeless tobacco. She reports that she does not drink alcohol and does not use drugs.  Allergies  Allergen Reactions   Codeine Nausea And Vomiting   Tape Other (See Comments) and Rash    Pull skin off.  Paper tape is ok    Family History  Adopted: Yes  Family history unknown: Yes    Prior to Admission medications   Medication Sig Start Date End Date Taking? Authorizing Provider  ADVAIR DISKUS 250-50 MCG/ACT AEPB Inhale 1 puff into the lungs 2 (two) times daily. 07/01/22  Yes [provider]  albuterol (PROVENTIL) (2.5 MG/3ML) 0.083% nebulizer solution Take 3 mLs (2.5 mg total) by nebulization every 12 (twelve) hours as needed for wheezing or shortness of breath. 08/05/22  Yes Valentino Nose, NP  ALPRAZolam Prudy Feeler) 1 MG  tablet Take 1 tablet (1 mg total) by mouth 4 (four) times daily as needed for anxiety. Patient taking differently: Take 1 mg by mouth 4 (four) times daily. 07/12/20  Yes Rhetta Mura, MD  Artificial Tear Ointment (DRY EYES OP) Place 1 drop into the right eye 2 (two) times daily.   Yes [provider]  atorvastatin (LIPITOR) 20 MG tablet Take 20 mg by mouth daily. 10/21/18  Yes [provider]  atropine (ISOPTO ATROPINE) 1 % ophthalmic solution Place 1 drop into the left eye 3 (three) times daily. 12/27/19  Yes [provider]  brimonidine (ALPHAGAN) 0.2 % ophthalmic solution Place 1 drop into the left eye 3 (three) times daily. 05/24/21  Yes [provider]  busPIRone (BUSPAR) 7.5 MG tablet Take 1 tablet (7.5 mg total) by mouth 2 (two) times daily. 07/12/20  Yes Rhetta Mura, MD  cinacalcet (SENSIPAR) 60 MG tablet Take 60 mg by mouth daily.   Yes [provider]  colchicine 0.6 MG tablet TAKE 1 TABLET(0.6 MG) BY MOUTH DAILY 06/04/23  Yes Felecia Shelling, DPM  doxercalciferol (HECTOROL) 4 MCG/2ML injection Inject 0.5 mLs (1 mcg total) into the vein every Monday, Wednesday, and Friday with hemodialysis. 12/03/18  Yes Kari Baars, MD  gabapentin (NEURONTIN) 100 MG capsule Take 1 capsule (100 mg total) by mouth at bedtime. Patient taking differently: Take 300 mg by mouth at bedtime. 07/12/20  Yes Rhetta Mura, MD  HYDROcodone-acetaminophen (NORCO) 10-325 MG tablet Take 1 tablet by mouth 4 (four) times daily. 08/30/20  Yes [provider]  insulin glargine (LANTUS SOLOSTAR) 100 UNIT/ML Solostar Pen Inject 30 Units into the skin daily.   Yes [provider]  latanoprost (XALATAN) 0.005 % ophthalmic solution Place 1 drop into the left eye 2 (two) times daily. 08/22/20  Yes [provider]  omeprazole (PRILOSEC) 40 MG capsule Take 40 mg by mouth daily.   Yes [provider]  ondansetron (ZOFRAN) 4 MG tablet  TAKE (1) TABLET THREE TIMES DAILY AS NEEDED FOR NAUSEA. 07/30/22  Yes [provider]  PARoxetine (PAXIL) 20 MG tablet Take 1 tablet (20 mg total) by mouth every evening. This is to prevent panic attacks Patient taking differently: Take 20 mg by mouth at bedtime. 07/12/20  Yes Rhetta Mura, MD  VELPHORO 500 MG chewable tablet Chew 1,000 mg by mouth 3 (three) times daily. 08/05/23  Yes [provider]  Blood Glucose Monitoring Suppl (FORA V30A BLOOD GLUCOSE SYSTEM) DEVI  09/22/20  [provider]  [facility] PREMIUM V10 TEST test strip  09/22/20   [provider]    Physical Exam: Vitals:   10/20/23 1600 10/20/23 1719 10/20/23 1830 10/20/23 1859  BP: 131/64  (!) 147/58 (!) 151/58  Pulse: 61  (!) 56 64  Resp: 18   18  Temp:  97.7 F (36.5 C)  97.7 F (36.5 C)  TempSrc:  Oral  Oral  SpO2: 94%  96% 98%  Weight:      Height:       General: Patient is blind out of both eyes.  Reports chronic left eye irritation due to mechanical irritation. Respiratory exam: Bilateral intravesicular Abdomen: Markedly obese/overweight appearing.  Nodularity of subcutaneous tissue.  Left upper quadrant tenderness, otherwise no tenderness.  Specifically patient denies any suprapubic pain or tenderness. Extremities warm without edema. CVS -s 1s2 normal Data Reviewed:  Labs on Admission:  Results for orders placed or performed during the hospital encounter of 10/20/23 (from the past 24 hours)  Lipase, blood     Status: None   Collection Time: 10/20/23 12:08 PM  Result Value Ref Range   Lipase 27 11 - 51 U/L  Comprehensive metabolic panel     Status: Abnormal   Collection Time: 10/20/23 12:08 PM  Result Value Ref Range   Sodium 143 135 - 145 mmol/L   Potassium 4.0 3.5 - 5.1 mmol/L   Chloride 103 98 - 111 mmol/L   CO2 22 22 - 32 mmol/L   Glucose, Bld 138 (H) 70 - 99 mg/dL   BUN 35 (H) 6 - 20 mg/dL   Creatinine, Ser 91.47 (H) 0.44 - 1.00 mg/dL   Calcium 9.7 8.9 -  82.9 mg/dL   Total Protein 7.4 6.5 - 8.1 g/dL   Albumin 3.9 3.5 - 5.0 g/dL   AST 11 (L) 15 - 41 U/L   ALT 9 0 - 44 U/L   Alkaline Phosphatase 126 38 - 126 U/L   Total Bilirubin 1.1 0.0 - 1.2 mg/dL   GFR, Estimated 4 (L) >60 mL/min   Anion gap 18 (H) 5 - 15  CBC     Status: Abnormal   Collection Time: 10/20/23 12:08 PM  Result Value Ref Range   WBC 3.3 (L) 4.0 - 10.5 K/uL   RBC 3.26 (L) 3.87 - 5.11 MIL/uL   Hemoglobin 9.2 (L) 12.0 - 15.0 g/dL   HCT 56.2 (L) 13.0 - 86.5 %   MCV 92.9 80.0 - 100.0 fL   MCH 28.2 26.0 - 34.0 pg   MCHC 30.4 30.0 - 36.0 g/dL   RDW 78.4 (H) 69.6 - 29.5 %   Platelets 107 (L) 150 - 400 K/uL   nRBC 0.0 0.0 - 0.2 %   Basic Metabolic Panel: Recent Labs  Lab 10/20/23 1208  NA 143  K 4.0  CL 103  CO2 22  GLUCOSE 138*  BUN 35*  CREATININE 11.55*  CALCIUM 9.7   Liver Function Tests: Recent Labs  Lab 10/20/23 1208  AST 11*  ALT 9  ALKPHOS 126  BILITOT 1.1  PROT 7.4  ALBUMIN 3.9   Recent Labs  Lab 10/20/23 1208  LIPASE 27   No results for input(s): "AMMONIA" in the last 168 hours. CBC: Recent Labs  Lab 10/20/23 1208  WBC 3.3*  HGB 9.2*  HCT 30.3*  MCV 92.9  PLT 107*   Cardiac Enzymes: No results for input(s): "CKTOTAL", "CKMB", "CKMBINDEX", "TROPONINIHS" in the last 168 hours.  BNP (last 3 results) No  results for input(s): "PROBNP" in the last 8760 hours. CBG: No results for input(s): "GLUCAP" in the last 168 hours.  Radiological Exams on Admission:  CT ABDOMEN PELVIS W CONTRAST Result Date: 10/20/2023 CLINICAL DATA:  Abdominal pain. EXAM: CT ABDOMEN AND PELVIS WITH CONTRAST TECHNIQUE: Multidetector CT imaging of the abdomen and pelvis was performed using the standard protocol following bolus administration of intravenous contrast. RADIATION DOSE REDUCTION: This exam was performed according to the departmental dose-optimization program which includes automated exposure control, adjustment of the mA and/or kV according to patient  size and/or use of iterative reconstruction technique. CONTRAST:  OMNIPAQUE IOHEXOL 300 MG/ML  SOLN COMPARISON:  CT abdomen pelvis dated 09/21/2023. FINDINGS: Lower chest: The visualized lung bases are clear. There is coronary vascular calcification. No intra-abdominal free air or free fluid. Hepatobiliary: The liver is unremarkable. No biliary dilatation. Cholecystectomy. No retained calcified stone noted in the central CBD. Pancreas: Unremarkable. No pancreatic ductal dilatation or surrounding inflammatory changes. Spleen: Splenomegaly measuring 17 cm in length. Which shaped peripheral hypoenhancement in the midportion of the spleen most consistent with an infarct and seen on the prior CT. Adrenals/Urinary Tract: The adrenal glands are unremarkable. Severe bilateral renal parenchyma atrophy. No hydronephrosis. The visualized ureters appear unremarkable. The urinary bladder is minimally distended. Stomach/Bowel: There is no bowel obstruction or active inflammation. The appendix is normal. Vascular/Lymphatic: Mild aortoiliac atherosclerotic disease. The IVC is unremarkable. No portal venous gas. Diffuse calcification of the mesenteric vessels. No adenopathy. Reproductive: The uterus is anteverted and grossly unremarkable. No suspicious adnexal masses. Other: There is diffuse skin thickening and subcutaneous edema of the anterior pelvic wall. No fluid collection. Musculoskeletal: Osteopenia with degenerative changes and findings of renal osteodystrophy. Fragmentation of the pubic symphysis as seen previously may represent osteomyelitis. Clinical correlation is recommended. No acute osseous pathology. IMPRESSION: 1. No bowel obstruction. Normal appendix. 2. Possible cellulitis of the anterior pelvic wall. No abscess or soft tissue gas. 3. Fragmentation of the pubic symphysis as seen previously may represent osteomyelitis. 4. Splenomegaly with a mid splenic infarct as seen previously. 5. Severe bilateral renal  parenchyma atrophy. No hydronephrosis. Electronically Signed   By: Elgie Collard M.D.   On: 10/20/2023 16:00     No intake/output data recorded. No intake/output data recorded.      Assessment and Plan: * Nausea & vomiting See hpi.. Reccurent episodes. Empric rx with pantop. Check stool for h pylori . Drug screen for canabis. Patient elevated Cr. And h/o missed dialysis sessions is concerning for uremic symptoms. Look forward to Renal eval in AM. Vomting is not related to meal or a.w. food aversion to suggest ischemia at this time.  Add on torponin.  Blind Patient is blind in both eyes with the left palpebral irritation/chronic injury due to rubbing.  Patient uses several eyedrops I believe for comfort including artificial tears treatment atropine brimonidine and latanoprost which will be continued.  I will request a wound care input to see if they can cover the left eye for the patient and promote healing.  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  And I do see on the CAT scan that the patient does have calcification of her blood vessels.  Continue with Sensipar as well as Hectorol.  Check phosphate in the morning. C.w. velphoro  Pancytopenia (HCC) Chrnoic mild stable. Check b12 and foalte.  Chronic diarrhea Reports chronic diarrhea has been using colchicine daily.  Hold colchicine at this time.  I do not see indication for colchicine.  Will  monitor.  Abdominal pain See HPI, seems to be chronic, focused around the left upper quadrant at this time.  Patient does have a splenic infarct.  Again which is previously known. Review of imaging shows prominent calcification of splenic artery.  At this time I will draw blood cultures to make sure were not missing a septic embolus.  Maintain the patient on telemetry to make sure were not missing atrial fibrillation and start the patient on heparin infusion.  CAT scan report of possible abdominal wall cellulitis is noted.  However clinically  speaking there is no infection at this time at the site.  There is nodularity under the skin. C/w patinet report of calciphylaxis. Patient pubic bone fragmentation more likely represent renal osteodystrophy.   Is using hydrocodone with acetaminophen at home with less than optimal pain control.  I will increase patient's dose to 1 to 2 tablets for moderate to severe pain respectively every 4 hours as needed.  Type 2 diabetes mellitus treated with insulin (HCC) Continue with insulin Lantus  OSA on CPAP Continue with CPAP.  Advair changed to [facility].  Continue with albuterol Q12 hourly as needed  RESTLESS LEG SYNDROME Continue with gabapentin  Depression with anxiety Patient is using Xanax 1 mg every 6 hours at home.  I think this will need to be tapered off.  I will reduce it to half a milligram every 6 hours as needed. C/w buspar. Continue with Paxil  Hyperlipidemia Continue with statin      Advance Care Planning:   Code Status: Full Code   Consults: Dr. Glenna Fellows of Renal engaged by ER provider - thank you.  Family Communication: per patient.  Severity of Illness: The appropriate patient status for this patient is OBSERVATION. Observation status is judged to be reasonable and necessary in order to provide the required intensity of service to ensure the patient's safety. The patient's presenting symptoms, physical exam findings, and initial radiographic and laboratory data in the context of their medical condition is felt to place them at decreased risk for further clinical deterioration. Furthermore, it is anticipated that the patient will be medically stable for discharge from the hospital within 2 midnights of admission.   Author: Nolberto Hanlon, MD 10/20/2023 7:55 PM  For on call review [email or web address].

## 2023-10-20 NOTE — Assessment & Plan Note (Signed)
 Continue with insulin Lantus

## 2023-10-21 ENCOUNTER — Observation Stay (HOSPITAL_COMMUNITY)

## 2023-10-21 DIAGNOSIS — D735 Infarction of spleen: Secondary | ICD-10-CM

## 2023-10-21 DIAGNOSIS — D61818 Other pancytopenia: Secondary | ICD-10-CM | POA: Diagnosis present

## 2023-10-21 DIAGNOSIS — F410 Panic disorder [episodic paroxysmal anxiety] without agoraphobia: Secondary | ICD-10-CM | POA: Diagnosis present

## 2023-10-21 DIAGNOSIS — I10 Essential (primary) hypertension: Secondary | ICD-10-CM | POA: Diagnosis not present

## 2023-10-21 DIAGNOSIS — N186 End stage renal disease: Secondary | ICD-10-CM | POA: Diagnosis present

## 2023-10-21 DIAGNOSIS — Z992 Dependence on renal dialysis: Secondary | ICD-10-CM | POA: Diagnosis not present

## 2023-10-21 DIAGNOSIS — G4733 Obstructive sleep apnea (adult) (pediatric): Secondary | ICD-10-CM | POA: Diagnosis present

## 2023-10-21 DIAGNOSIS — E1122 Type 2 diabetes mellitus with diabetic chronic kidney disease: Secondary | ICD-10-CM | POA: Diagnosis present

## 2023-10-21 DIAGNOSIS — L309 Dermatitis, unspecified: Secondary | ICD-10-CM | POA: Diagnosis present

## 2023-10-21 DIAGNOSIS — K529 Noninfective gastroenteritis and colitis, unspecified: Secondary | ICD-10-CM | POA: Diagnosis present

## 2023-10-21 DIAGNOSIS — G2581 Restless legs syndrome: Secondary | ICD-10-CM | POA: Diagnosis present

## 2023-10-21 DIAGNOSIS — Z7951 Long term (current) use of inhaled steroids: Secondary | ICD-10-CM | POA: Diagnosis not present

## 2023-10-21 DIAGNOSIS — L089 Local infection of the skin and subcutaneous tissue, unspecified: Secondary | ICD-10-CM | POA: Diagnosis present

## 2023-10-21 DIAGNOSIS — Z6841 Body Mass Index (BMI) 40.0 and over, adult: Secondary | ICD-10-CM | POA: Diagnosis not present

## 2023-10-21 DIAGNOSIS — I120 Hypertensive chronic kidney disease with stage 5 chronic kidney disease or end stage renal disease: Secondary | ICD-10-CM | POA: Diagnosis present

## 2023-10-21 DIAGNOSIS — R109 Unspecified abdominal pain: Secondary | ICD-10-CM | POA: Diagnosis present

## 2023-10-21 DIAGNOSIS — F17210 Nicotine dependence, cigarettes, uncomplicated: Secondary | ICD-10-CM | POA: Diagnosis present

## 2023-10-21 DIAGNOSIS — Z7901 Long term (current) use of anticoagulants: Secondary | ICD-10-CM | POA: Diagnosis not present

## 2023-10-21 DIAGNOSIS — E785 Hyperlipidemia, unspecified: Secondary | ICD-10-CM | POA: Diagnosis present

## 2023-10-21 DIAGNOSIS — N2581 Secondary hyperparathyroidism of renal origin: Secondary | ICD-10-CM | POA: Diagnosis present

## 2023-10-21 DIAGNOSIS — G8929 Other chronic pain: Secondary | ICD-10-CM | POA: Diagnosis present

## 2023-10-21 DIAGNOSIS — K7581 Nonalcoholic steatohepatitis (NASH): Secondary | ICD-10-CM | POA: Diagnosis present

## 2023-10-21 DIAGNOSIS — R112 Nausea with vomiting, unspecified: Secondary | ICD-10-CM | POA: Diagnosis not present

## 2023-10-21 DIAGNOSIS — F319 Bipolar disorder, unspecified: Secondary | ICD-10-CM | POA: Diagnosis present

## 2023-10-21 DIAGNOSIS — K7460 Unspecified cirrhosis of liver: Secondary | ICD-10-CM | POA: Diagnosis present

## 2023-10-21 DIAGNOSIS — J449 Chronic obstructive pulmonary disease, unspecified: Secondary | ICD-10-CM | POA: Diagnosis present

## 2023-10-21 LAB — URINALYSIS, ROUTINE W REFLEX MICROSCOPIC
Bilirubin Urine: NEGATIVE
Glucose, UA: 150 mg/dL — AB
Hgb urine dipstick: NEGATIVE
Ketones, ur: NEGATIVE mg/dL
Nitrite: NEGATIVE
Protein, ur: >=300 mg/dL — AB
Specific Gravity, Urine: 1.016 (ref 1.005–1.030)
pH: 8 (ref 5.0–8.0)

## 2023-10-21 LAB — BASIC METABOLIC PANEL
Anion gap: 16 — ABNORMAL HIGH (ref 5–15)
BUN: 38 mg/dL — ABNORMAL HIGH (ref 6–20)
CO2: 19 mmol/L — ABNORMAL LOW (ref 22–32)
Calcium: 8.5 mg/dL — ABNORMAL LOW (ref 8.9–10.3)
Chloride: 105 mmol/L (ref 98–111)
Creatinine, Ser: 12.03 mg/dL — ABNORMAL HIGH (ref 0.44–1.00)
GFR, Estimated: 3 mL/min — ABNORMAL LOW (ref >60)
Glucose, Bld: 141 mg/dL — ABNORMAL HIGH (ref 70–99)
Potassium: 4.1 mmol/L (ref 3.5–5.1)
Sodium: 140 mmol/L (ref 135–145)

## 2023-10-21 LAB — CBC
HCT: 28.8 % — ABNORMAL LOW (ref 36.0–46.0)
Hemoglobin: 8.3 g/dL — ABNORMAL LOW (ref 12.0–15.0)
MCH: 27.5 pg (ref 26.0–34.0)
MCHC: 28.8 g/dL — ABNORMAL LOW (ref 30.0–36.0)
MCV: 95.4 fL (ref 80.0–100.0)
Platelets: 78 10*3/uL — ABNORMAL LOW (ref 150–400)
RBC: 3.02 MIL/uL — ABNORMAL LOW (ref 3.87–5.11)
RDW: 18.7 % — ABNORMAL HIGH (ref 11.5–15.5)
WBC: 3.5 10*3/uL — ABNORMAL LOW (ref 4.0–10.5)
nRBC: 0 % (ref 0.0–0.2)

## 2023-10-21 LAB — ECHOCARDIOGRAM COMPLETE
AR max vel: 2.3 cm2
AV Area VTI: 2.31 cm2
AV Area mean vel: 2.01 cm2
AV Mean grad: 6 mmHg
AV Peak grad: 13.1 mmHg
Ao pk vel: 1.81 m/s
Area-P 1/2: 4.46 cm2
Calc EF: 58.7 %
Height: 67 in
MV VTI: 2.67 cm2
S' Lateral: 3.4 cm
Single Plane A2C EF: 64.2 %
Single Plane A4C EF: 57.7 %
Weight: 4160 [oz_av]

## 2023-10-21 LAB — HEPARIN LEVEL (UNFRACTIONATED)
Heparin Unfractionated: 0.16 [IU]/mL — ABNORMAL LOW (ref 0.30–0.70)
Heparin Unfractionated: 0.31 [IU]/mL (ref 0.30–0.70)

## 2023-10-21 LAB — GLUCOSE, CAPILLARY
Glucose-Capillary: 122 mg/dL — ABNORMAL HIGH (ref 70–99)
Glucose-Capillary: 96 mg/dL (ref 70–99)
Glucose-Capillary: 127 mg/dL — ABNORMAL HIGH (ref 70–99)

## 2023-10-21 LAB — VITAMIN B12: Vitamin B-12: 421 pg/mL (ref 180–914)

## 2023-10-21 LAB — FOLATE: Folate: 6.8 ng/mL (ref >5.9)

## 2023-10-21 LAB — PHOSPHORUS: Phosphorus: 6.4 mg/dL — ABNORMAL HIGH (ref 2.5–4.6)

## 2023-10-21 MED ORDER — PROCHLORPERAZINE EDISYLATE 10 MG/2ML IJ SOLN
10.0000 mg | Freq: Four times a day (QID) | INTRAMUSCULAR | Status: DC | PRN
  Administered 2023-10-21 (×2): 10 mg via INTRAVENOUS
  Filled 2023-10-21 (×2): qty 2

## 2023-10-21 MED ORDER — CINACALCET HCL 30 MG PO TABS
30.0000 mg | ORAL_TABLET | ORAL | Status: DC
Start: 2023-10-21 — End: 2023-10-23

## 2023-10-21 MED ORDER — CHLORHEXIDINE GLUCONATE CLOTH 2 % EX PADS
6.0000 | MEDICATED_PAD | Freq: Every day | CUTANEOUS | Status: DC
  Administered 2023-10-22 – 2023-10-23 (×2): 6 via TOPICAL

## 2023-10-21 MED ORDER — ALBUTEROL SULFATE (2.5 MG/3ML) 0.083% IN NEBU
2.5000 mg | INHALATION_SOLUTION | Freq: Every day | RESPIRATORY_TRACT | Status: DC
  Administered 2023-10-21 – 2023-10-23 (×3): 2.5 mg via RESPIRATORY_TRACT
  Filled 2023-10-21 (×4): qty 3

## 2023-10-21 MED ORDER — CALCITRIOL 0.25 MCG PO CAPS: 2.0000 ug | ORAL_CAPSULE | ORAL | Status: DC

## 2023-10-21 MED ORDER — LORAZEPAM 2 MG/ML IJ SOLN
0.5000 mg | Freq: Once | INTRAMUSCULAR | Status: AC
  Administered 2023-10-21: 0.5 mg via INTRAVENOUS
  Filled 2023-10-21: qty 1

## 2023-10-21 MED ORDER — CALCITRIOL 0.25 MCG PO CAPS
ORAL_CAPSULE | ORAL | Status: AC
  Filled 2023-10-21: qty 8

## 2023-10-21 MED ORDER — ALBUTEROL SULFATE (2.5 MG/3ML) 0.083% IN NEBU: 2.5000 mg | INHALATION_SOLUTION | RESPIRATORY_TRACT | Status: DC | PRN

## 2023-10-21 MED ORDER — HEPARIN BOLUS VIA INFUSION
2500.0000 [IU] | Freq: Once | INTRAVENOUS | Status: AC
  Administered 2023-10-21: 2500 [IU] via INTRAVENOUS
  Filled 2023-10-21: qty 2500

## 2023-10-21 MED ORDER — CINACALCET HCL 30 MG PO TABS
ORAL_TABLET | ORAL | Status: AC
  Filled 2023-10-21: qty 1

## 2023-10-21 NOTE — Progress Notes (Signed)
 PHARMACY - ANTICOAGULATION CONSULT NOTE  Pharmacy Consult for Heparin Indication:  splenic infarction  Allergies  Allergen Reactions   Codeine Nausea And Vomiting   Tape Other (See Comments) and Rash    Pull skin off.  Paper tape is ok   Patient Measurements: Height: 5\' 7"  (170.2 cm) Weight: 117.9 kg (260 lb) IBW/kg (Calculated) : 61.6 Heparin Dosing Weight: 89.3 kg   Vital Signs: BP: 166/94 (03/26 0256) Pulse Rate: 77 (03/26 0256)  Labs: Recent Labs    10/20/23 1208 10/20/23 2132 10/21/23 0425 10/21/23 0734  HGB 9.2*  --  8.3*  --   HCT 30.3*  --  28.8*  --   PLT 107*  --  78*  --   APTT  --  28  --   --   LABPROT  --  16.8*  --   --   INR  --  1.3*  --   --   HEPARINUNFRC  --   --   --  0.16*  CREATININE 11.55*  --  12.03*  --   TROPONINIHS  --  12  --   --    Estimated Creatinine Clearance: 7 mL/min (A) (by C-G formula based on SCr of 12.03 mg/dL (H)).  Medical History: Past Medical History:  Diagnosis Date   Acid reflux    takes Tums   Anemia    Arthritis    Bipolar 1 disorder (HCC)    Blindness of left eye    left eye removed   Carpal tunnel syndrome, bilateral    Cervical radiculopathy    CHF (congestive heart failure) (HCC)    Chronic kidney disease    Stage 5- 01/25/17   Constipation    COPD (chronic obstructive pulmonary disease) (HCC) 2014   bronchitis   Degenerative disc disease, thoracic    Depression    Diabetes mellitus    Type II   Diabetic retinopathy (HCC)    Dyspnea    when walking   End stage kidney disease (HCC)    M, W, F [facility]   Gout    Hypertension    Non-alcoholic cirrhosis (HCC)    Noncompliance with medication regimen    Noncompliance with medication regimen    Obesity (BMI 30-39.9)    OSA (obstructive sleep apnea)    cpap   Panic attack    RLS (restless legs syndrome)    Medications:  No PTA anticoagulation per chart review  Baseline Labs: Hgb 9.2 Plt 107 INR/PT and aPTT - ordered    Assessment: Martha [name] is a 58 yo female who presented with chronic 2-3 loose water bowel movements at home. They had an acute onset of abdominal pain, severe in nature. Missed their HD appointment yesterday. They were seen for a similar syndrome on 09/22/2023 and diagnosed with splenic infarction, then left AMA. Pharmacy has been consulted to manage this patient's heparin.   HL 0.16- subtherapeutic Hgb 8.3 Platelets 78  Goal of Therapy:  Heparin level 0.3-0.7 units/ml Monitor platelets by anticoagulation protocol: Yes   Plan:  Rebolus 2500 units IV x 1 Increase heparin infusion to 1800 units/hr Follow-up with heparin levels in 8 hours Continue for follow CBC with AM labs  Thank you for allowing pharmacy to participate in this patient's care.   Judeth Cornfield, PharmD Clinical Pharmacist 10/21/2023 9:15 AM

## 2023-10-21 NOTE — TOC Progression Note (Signed)
 Transition of Care [facility]) - Progression Note    Patient Details  Name: Martha [name] MRN: [id number] Date of Birth: 10-11-1965  Transition of Care [facility] CM/SW Contact  Karn Cassis, [place] Phone Number: 10/21/2023, 11:44 AM  Clinical Narrative:  Pt requesting 3N1. Agreeable to [employer]. Referred to [facility] with [employer] to drop ship to pt's home. Order and narrative in.        Barriers to Discharge: Continued Medical Work up  Expected Discharge Plan and Services                         DME Arranged: 3-N-1 DME Agency: [employer] Date DME Agency Contacted: 10/21/23 Time DME Agency Contacted: 1112 Representative spoke with at DME Agency: Ian Malkin             Social Determinants of Health (SDOH) Interventions SDOH Screenings   Food Insecurity: No Food Insecurity (10/20/2023)  Housing: Low Risk  (10/20/2023)  Transportation Needs: No Transportation Needs (10/20/2023)  Utilities: Not At Risk (10/20/2023)  Depression (PHQ2-9): Low Risk  (03/11/2023)  Social Connections: Unknown (10/20/2023)  Tobacco Use: High Risk (10/20/2023)    Readmission Risk Interventions    09/22/2023   10:43 AM 10/18/2021   12:12 PM  Readmission Risk Prevention Plan  Transportation Screening Complete Complete  HRI or Home Care Consult  Complete  Social Work Consult for Recovery Care Planning/Counseling  Complete  Palliative Care Screening  Not Applicable  Medication Review Oceanographer) Complete Complete  HRI or Home Care Consult Complete   SW Recovery Care/Counseling Consult Complete   Palliative Care Screening Not Applicable   Skilled Nursing Facility Not Applicable

## 2023-10-21 NOTE — Progress Notes (Signed)
 PHARMACY - ANTICOAGULATION CONSULT NOTE  Pharmacy Consult for Heparin Indication:  splenic infarction  Allergies  Allergen Reactions   Codeine Nausea And Vomiting   Tape Other (See Comments) and Rash    Pull skin off.  Paper tape is ok   Patient Measurements: Height: 5\' 7"  (170.2 cm) Weight: 118.6 kg (261 lb 7.5 oz) IBW/kg (Calculated) : 61.6 Heparin Dosing Weight: 89.3 kg   Vital Signs: Temp: 98 F (36.7 C) (03/26 1440) Temp Source: Oral (03/26 1440) BP: 130/60 (03/26 1440) Pulse Rate: 59 (03/26 1440)  Labs: Recent Labs    10/20/23 1208 10/20/23 2132 10/21/23 0425 10/21/23 0734 10/21/23 1641  HGB 9.2*  --  8.3*  --   --   HCT 30.3*  --  28.8*  --   --   PLT 107*  --  78*  --   --   APTT  --  28  --   --   --   LABPROT  --  16.8*  --   --   --   INR  --  1.3*  --   --   --   HEPARINUNFRC  --   --   --  0.16* 0.31  CREATININE 11.55*  --  12.03*  --   --   TROPONINIHS  --  12  --   --   --    Estimated Creatinine Clearance: 7 mL/min (A) (by C-G formula based on SCr of 12.03 mg/dL (H)).  Medical History: Past Medical History:  Diagnosis Date   Acid reflux    takes Tums   Anemia    Arthritis    Bipolar 1 disorder (HCC)    Blindness of left eye    left eye removed   Carpal tunnel syndrome, bilateral    Cervical radiculopathy    CHF (congestive heart failure) (HCC)    Chronic kidney disease    Stage 5- 01/25/17   Constipation    COPD (chronic obstructive pulmonary disease) (HCC) 2014   bronchitis   Degenerative disc disease, thoracic    Depression    Diabetes mellitus    Type II   Diabetic retinopathy (HCC)    Dyspnea    when walking   End stage kidney disease (HCC)    M, W, F [facility]   Gout    Hypertension    Non-alcoholic cirrhosis (HCC)    Noncompliance with medication regimen    Noncompliance with medication regimen    Obesity (BMI 30-39.9)    OSA (obstructive sleep apnea)    cpap   Panic attack    RLS (restless legs syndrome)     Medications:  No PTA anticoagulation per chart review  Baseline Labs: Hgb 9.2 Plt 107 INR/PT and aPTT - ordered   Assessment: Martha [name] is a 58 yo female who presented with chronic 2-3 loose water bowel movements at home. They had an acute onset of abdominal pain, severe in nature. Missed their HD appointment yesterday. They were seen for a similar syndrome on 09/22/2023 and diagnosed with splenic infarction, then left AMA. Pharmacy has been consulted to manage this patient's heparin.   HL 0.31- therapeutic Hgb 8.3 Platelets 78  Goal of Therapy:  Heparin level 0.3-0.7 units/ml Monitor platelets by anticoagulation protocol: Yes   Plan:  Increase heparin infusion to 1900 units/hr since on lower end of therapeutic Follow-up with heparin levels in 6-8 hours Continue for follow CBC with AM labs  Thank you for allowing pharmacy to participate in this  patient's care.   Judeth Cornfield, PharmD Clinical Pharmacist 10/21/2023 5:21 PM

## 2023-10-21 NOTE — Progress Notes (Signed)
 [facility] PROGRESS NOTE  [name] (DOB: 11-09-1965) EXB:[id number] PCP: Carmel Sacramento, NP  Brief Narrative: Martha [name] is a 58 y.o. female with a history of ESRD (HD on MWF for 7 years), recently diagnosed splenic infarct who presented to the ED on 10/20/2023 with abdominal pain associated with nausea and vomiting, caused her to not go to routine dialysis on 3/24. Symptoms improved with medications in the ED. Heparin started and blood cultures, echocardiogram and cardiac telemetry ordered as work up. Nephrology consulted for initiation of dialysis.   Subjective: Feels less lethargic, less ill. Her abdominal pain is resolved currently, no nausea or vomiting but hasn't eaten. No RLQ abdominal pain either.   Objective: BP 130/60 (BP Location: Right Arm)   Pulse (!) 59   Temp 98 F (36.7 C) (Oral)   Resp 16   Ht 5\' 7"  (1.702 m)   Wt 118.6 kg   LMP 08/03/2008 (Exact Date)   SpO2 98%   BMI 40.95 kg/m   Gen: Chronically ill-appearing pleasant female in no distress Pulm: Clear, nonlabored  CV: RRR,  GI: Soft, NT, ND, +BS. Some nodularity that is nontender to pannus, no open wounds or exudate.  Neuro: Alert and oriented. No new focal deficits. Ext: Warm, no deformities. LUE AVF +thrill Skin: No new rashes, lesions or ulcers on visualized skin   Assessment & Plan: Abdominal pain, nausea, vomiting: Suspected to be related to splenic infarct.  - Continue analgesics, antiemetics prn and PPI - Anticipate gradual improvement with anticoagulation.  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  - Stopped colchicine standing order ?previous notes suggest gout.   Splenic infarct:  - Cotninue to monitor blood cultures to r/o bacterial embolic etiology (history of MRSA bacteremia 2021). Reported some areas like pustules to nephrology, I don't see any at this time. - Check echo to evaluate for cardioembolic  etiology - Continue cardiac monitoring to monitor for occult atrial fibrillation.  - Continue heparin for now pending further work up, consider transition to apixaban 3/27.   ESRD:  - Nephrology consult appreciated to restart routine HD, typically MWF, missed on 3/24, thru LUE AVF.   History of calciphylaxis - Continue sensipar as well as Hectorol.  Check phosphate in the morning. C.w. velphoro   Pancytopenia: Chronic, mild. B12 and folic acid wnl. ?related to splenic sequestration.  - Monitor on anticoagulation.    IDT2DM: Last HbA1c reportedly 5.2%.  - Continue SSI, glargine insulin.     OSA:  - Continue CPAP.     RLS:  - Continue gabapentin.    Depression, anxiety, bipolar disorder: Quiescent.  - Continue long term benzodiazepine, would ideally taper this high risk medication.  - Continue buspar and paroxetine.    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  - Continue gtt's.   Obesity, class 2: Body mass index is 40.95 kg/m.   Tyrone Nine, MD [facility] [email or web address] 10/21/2023, 4:54 PM

## 2023-10-21 NOTE — Progress Notes (Signed)
Patient off the floor to hemodialysis. 

## 2023-10-21 NOTE — Consult Note (Signed)
 WOC Nurse Consult Note: Reason for Consult:Blindness with left eye removed.  Eye dryness with erythema and  periorbital dermatitis to left eye.  HAs resumed home eye drops and states she is feeling much more comfortable and does not want to wear an eye patch at this time  I will place PRN order as she indicates she may want this later.  Wound type: inflammatory Pressure Injury POA: NA Measurement: erythema and irritation around left eye orbit.  Wound bed:red Drainage (amount, consistency, odor)  none noted, is moist from eye drops  Periwound: intact Dressing procedure/placement/frequency: May use eye patch if patient desires to.  She has resumed home meds and eyes are more comfortable now.  Will not follow at this time.  Please re-consult if needed.  Mike Gip MSN, RN, FNP-BC CWON Wound, Ostomy, Continence Nurse Outpatient [facility] [phone] Pager [phone]

## 2023-10-21 NOTE — Plan of Care (Signed)
   Problem: Health Behavior/Discharge Planning: Goal: Ability to manage health-related needs will improve Outcome: Progressing

## 2023-10-21 NOTE — Progress Notes (Signed)
 The beneficiary is confined to a single room and will need 3N1.

## 2023-10-21 NOTE — Consult Note (Signed)
 [name]  INPATIENT CONSULTATION  Reason for Consultation: ESRD on HD Requesting Provider: Dr Jarvis Newcomer  HPI: Martha [name] is an 58 y.o. female with ESRD on HD, COPD, bipolar DO, HTN, obesity, NASH, visual impairment of the L eye currently admitted with splenic infarct and nephrology is consulted for evaluation and management of ESRD and assoc conditions.   Presented to the ED yesterday with abdominal pain, mainly LUQ.  Had been previously dx with splenic infarct and admitted 2/25 for the same but signed out AMA.  She returned to the ED yesterday due to pain.  She was started on heparin gtt, blood cultures done r/o septic embolus, TTE pending, on tele watch for A fib.   CT showed some nodularity of the skin in the pubic area and she is reporting h/o calciphylaxis about 6 months ago.   She has a healed skin lesion on the R side of her pannus.  It was very painful when it developed. She has 2 small skin lesions on the L abd she says started as pustules and are not painful.    No recent issues with LUE AVF.  Says has been on HD for about 7y.  PMH: Past Medical History:  Diagnosis Date   Acid reflux    takes Tums   Anemia    Arthritis    Bipolar 1 disorder (HCC)    Blindness of left eye    left eye removed   Carpal tunnel syndrome, bilateral    Cervical radiculopathy    CHF (congestive heart failure) (HCC)    Chronic kidney disease    Stage 5- 01/25/17   Constipation    COPD (chronic obstructive pulmonary disease) (HCC) 2014   bronchitis   Degenerative disc disease, thoracic    Depression    Diabetes mellitus    Type II   Diabetic retinopathy (HCC)    Dyspnea    when walking   End stage kidney disease (HCC)    M, W, F [facility]   Gout    Hypertension    Non-alcoholic cirrhosis (HCC)    Noncompliance with medication regimen    Noncompliance with medication regimen    Obesity (BMI 30-39.9)    OSA (obstructive sleep apnea)    cpap   Panic attack    RLS  (restless legs syndrome)    PSH: Past Surgical History:  Procedure Laterality Date   AORTIC ARCH ANGIOGRAPHY N/A 09/11/2020   Procedure: AORTIC ARCH ANGIOGRAPHY;  Surgeon: Nada Libman, MD;  [facility];  Service: Cardiovascular;  Laterality: N/A;   AV FISTULA PLACEMENT Right 01/27/2017   Procedure: ARTERIOVENOUS (AV) FISTULA CREATION-RIGHT ARM;  Surgeon: Sherren Kerns, MD;  Location: [facility] OR;  Service: Vascular;  Laterality: Right;   AV FISTULA PLACEMENT Left 08/03/2018   Procedure: LEFT ARTERIOVENOUS (AV) FISTULA CREATION;  Surgeon: Sherren Kerns, MD;  Location: [facility] OR;  Service: Vascular;  Laterality: Left;   BASCILIC VEIN TRANSPOSITION Left 04/26/2019   Procedure: SECOND STAGE BASILIC VEIN TRANSPOSITION LEFT ARM;  Surgeon: Sherren Kerns, MD;  Location: [facility] OR;  Service: Vascular;  Laterality: Left;   BIOPSY N/A 05/17/2013   Procedure: BIOPSY;  Surgeon: West Bali, MD;  Location: [facility] ORS;  Service: Endoscopy;  Laterality: N/A;   CARPAL TUNNEL RELEASE Left 11/25/2018   Procedure: LEFT CARPAL TUNNEL RELEASE;  Surgeon: Dairl Ponder, MD;  Location: [facility] OR;  Service: Orthopedics;  Laterality: Left;   CATARACT EXTRACTION Right  CHOLECYSTECTOMY N/A 04/27/2014   Procedure: LAPAROSCOPIC CHOLECYSTECTOMY;  Surgeon: Marlane Hatcher, MD;  Location: [facility] ORS;  Service: General;  Laterality: N/A;   COLONOSCOPY  06/06/2010   NGE:XBMWUX bleeding secondary to internal hemorrhoids but incomplete evaluation secondary to poor right colon prep/small rectal and sigmoid colon polyps (hyperplastic). PROPOFOL   COLONOSCOPY  May 2013   Dr. Gilliam/[facility]: 5 mm a descending colon polyp, hyperplastic. Adequate bowel prep.   ENDOMETRIAL ABLATION     ENUCLEATION Left 02/04/2018   ENUCLEATION WITH PLACEMENT OF IMPLANT LEFT EYE   ENUCLEATION Left 02/04/2018   Procedure: ENUCLEATION WITH PLACEMENT OF IMPLANT LEFT EYE;  Surgeon: Floydene Flock, MD;  Location: [facility] OR;  Service:  Ophthalmology;  Laterality: Left;   ESOPHAGOGASTRODUODENOSCOPY  06/06/2010   LKG:MWNUUVO erythema and edema of body of stomach, with sessile polypoid lesions. bx benign. no h.pylori   ESOPHAGOGASTRODUODENOSCOPY (EGD) WITH PROPOFOL N/A 05/17/2013   Dr. Darrick Penna: normal esophagus, moderate nodular gastritis, negative path, empiric Savary dilation   EYE SURGERY  07/2017   sx for glaucoma   FLEXIBLE SIGMOIDOSCOPY N/A 05/17/2013   Dr. Darrick Penna: moderate sized internal hemorrhoids   HEMORRHOID BANDING N/A 05/17/2013   Procedure: HEMORRHOID BANDING;  Surgeon: West Bali, MD;  Location: [facility];  Service: Endoscopy;  Laterality: N/A;  2 bands placed   INCISION AND DRAINAGE ABSCESS Left 10/11/2013   Procedure: INCISION AND DRAINAGE AND DEBRIDEMENT LEFT BREAST  ABSCESS;  Surgeon: Marlane Hatcher, MD;  Location: [facility];  Service: General;  Laterality: Left;   INSERTION OF DIALYSIS CATHETER N/A 06/08/2018   Procedure: INSERTION OF Right internal jugular TUNNELED  DIALYSIS CATHETER;  Surgeon: Chuck Hint, MD;  Location: [facility] OR;  Service: Vascular;  Laterality: N/A;   IRRIGATION AND DEBRIDEMENT ABSCESS Right 06/01/2013   Procedure: INCISION AND DRAINAGE AND DEBRIDEMENT ABSCESS RIGHT BREAST;  Surgeon: Marlane Hatcher, MD;  Location: [facility];  Service: General;  Laterality: Right;   LEFT EYE REMOVED Left 01/2018   [facility].   LIGATION OF ARTERIOVENOUS  FISTULA Right 06/08/2018   Procedure: LIGATION OF ARTERIOVENOUS  FISTULA RIGHT ARM;  Surgeon: Chuck Hint, MD;  Location: [facility] OR;  Service: Vascular;  Laterality: Right;   ORIF ANKLE FRACTURE Right 12/17/2018   Procedure: OPEN REDUCTION INTERNAL FIXATION (ORIF) ANKLE FRACTURE;  Surgeon: Felecia Shelling, DPM;  [facility];  Service: Podiatry;  Laterality: Right;   REVISON OF ARTERIOVENOUS FISTULA Left 09/26/2020   Procedure: LEFT ARTERIOVENOUS FISTULA  DISTAL REVASCULARIZATION AND LIGATION ( DRIL ) USING  GREATER SAPHENOUS VEIN;  Surgeon: Maeola Harman, MD;  Location: [facility] OR;  Service: Vascular;  Laterality: Left;   SAVORY DILATION N/A 05/17/2013   Procedure: SAVORY DILATION;  Surgeon: West Bali, MD;  Location: [facility];  Service: Endoscopy;  Laterality: N/A;  14/15/16   SKIN FULL THICKNESS GRAFT Left 05/05/2018   Procedure: ABDOMINAL DERMIS FAT SKIN GRAFT FULL THICKNESS LEFT EYE;  Surgeon: Floydene Flock, MD;  Location[facility];  Service: Ophthalmology;  Laterality: Left;   TUBAL LIGATION     VEIN HARVEST Left 09/26/2020   Procedure: LEFT LEG GREATER SAPHENOUS VEIN HARVEST;  Surgeon: Maeola Harman, MD;  Location: [facility] OR;  Service: Vascular;  Laterality: Left;     Past Medical History:  Diagnosis Date   Acid reflux    takes Tums   Anemia    Arthritis    Bipolar 1 disorder (HCC)    Blindness of left eye  left eye removed   Carpal tunnel syndrome, bilateral    Cervical radiculopathy    CHF (congestive heart failure) (HCC)    Chronic kidney disease    Stage 5- 01/25/17   Constipation    COPD (chronic obstructive pulmonary disease) (HCC) 2014   bronchitis   Degenerative disc disease, thoracic    Depression    Diabetes mellitus    Type II   Diabetic retinopathy (HCC)    Dyspnea    when walking   End stage kidney disease (HCC)    M, W, F [facility]   Gout    Hypertension    Non-alcoholic cirrhosis (HCC)    Noncompliance with medication regimen    Noncompliance with medication regimen    Obesity (BMI 30-39.9)    OSA (obstructive sleep apnea)    cpap   Panic attack    RLS (restless legs syndrome)     Medications:  I have reviewed the patient's current medications.  Medications Prior to Admission  Medication Sig Dispense Refill   ADVAIR DISKUS 250-50 MCG/ACT AEPB Inhale 1 puff into the lungs 2 (two) times daily.     albuterol (PROVENTIL) (2.5 MG/3ML) 0.083% nebulizer solution Take 3 mLs (2.5 mg total) by nebulization every 12 (twelve) hours  as needed for wheezing or shortness of breath. 75 mL 0   ALPRAZolam (XANAX) 1 MG tablet Take 1 tablet (1 mg total) by mouth 4 (four) times daily as needed for anxiety. (Patient taking differently: Take 1 mg by mouth 4 (four) times daily.) 9 tablet 0   Artificial Tear Ointment (DRY EYES OP) Place 1 drop into the right eye 2 (two) times daily.     atorvastatin (LIPITOR) 20 MG tablet Take 20 mg by mouth daily.     atropine (ISOPTO ATROPINE) 1 % ophthalmic solution Place 1 drop into the left eye 3 (three) times daily.     brimonidine (ALPHAGAN) 0.2 % ophthalmic solution Place 1 drop into the left eye 3 (three) times daily.     busPIRone (BUSPAR) 7.5 MG tablet Take 1 tablet (7.5 mg total) by mouth 2 (two) times daily. 7 tablet 0   cinacalcet (SENSIPAR) 60 MG tablet Take 60 mg by mouth daily.     colchicine 0.6 MG tablet TAKE 1 TABLET(0.6 MG) BY MOUTH DAILY 30 tablet 0   doxercalciferol (HECTOROL) 4 MCG/2ML injection Inject 0.5 mLs (1 mcg total) into the vein every Monday, Wednesday, and Friday with hemodialysis. 2 mL    gabapentin (NEURONTIN) 100 MG capsule Take 1 capsule (100 mg total) by mouth at bedtime. (Patient taking differently: Take 300 mg by mouth at bedtime.) 7 capsule 0   HYDROcodone-acetaminophen (NORCO) 10-325 MG tablet Take 1 tablet by mouth 4 (four) times daily.     insulin glargine (LANTUS SOLOSTAR) 100 UNIT/ML Solostar Pen Inject 30 Units into the skin daily.     latanoprost (XALATAN) 0.005 % ophthalmic solution Place 1 drop into the left eye 2 (two) times daily.     omeprazole (PRILOSEC) 40 MG capsule Take 40 mg by mouth daily.     ondansetron (ZOFRAN) 4 MG tablet TAKE (1) TABLET THREE TIMES DAILY AS NEEDED FOR NAUSEA.     PARoxetine (PAXIL) 20 MG tablet Take 1 tablet (20 mg total) by mouth every evening. This is to prevent panic attacks (Patient taking differently: Take 20 mg by mouth at bedtime.) 7 tablet 0   VELPHORO 500 MG chewable tablet Chew 1,000 mg by mouth 3 (three) times  daily.  Blood Glucose Monitoring Suppl (FORA V30A BLOOD GLUCOSE SYSTEM) DEVI      FORACARE PREMIUM V10 TEST test strip       ALLERGIES:   Allergies  Allergen Reactions   Codeine Nausea And Vomiting   Tape Other (See Comments) and Rash    Pull skin off.  Paper tape is ok    FAM HX: Family History  Adopted: Yes  Family history unknown: Yes    Social History:   reports that she has been smoking cigarettes and e-cigarettes. She started smoking about 41 years ago. She has a 37.5 pack-year smoking history. She has never used smokeless tobacco. She reports that she does not drink alcohol and does not use drugs.  ROS: 12 system ROS neg except per HPI  Blood pressure (!) 166/94, pulse 77, temperature 97.7 F (36.5 C), temperature source Oral, resp. rate 20, height 5\' 7"  (1.702 m), weight 117.9 kg, last menstrual period 08/03/2008, SpO2 98%. PHYSICAL EXAM: Gen: uncomfortable sitting at edge of bed  Eyes: L eye enucleated months ago surrounded by erythema she says from chronic rubbing ENT: MMM Neck:thick CV: RRR Abd: obese, TTP, healed/scar R side, nodular texture to SQ tissue throughout pannus, 2 small scabs L abd which she says were pustules, no drainage, nontender, no suprapubic skin lesions Back: dec BS but overall clear, normal WOB Extr: LUE AVF +t/b, no edema Neuro: conversant, grossly nonfocal other than vision loss   Results for orders placed or performed during the hospital encounter of 10/20/23 (from the past 48 hours)  Lipase, blood     Status: None   Collection Time: 10/20/23 12:08 PM  Result Value Ref Range   Lipase 27 11 - 51 U/L    Comment: Performed at [facility], [address], [place], [place] [address]  Comprehensive metabolic panel     Status: Abnormal   Collection Time: 10/20/23 12:08 PM  Result Value Ref Range   Sodium 143 135 - 145 mmol/L   Potassium 4.0 3.5 - 5.1 mmol/L   Chloride 103 98 - 111 mmol/L   CO2 22 22 - 32 mmol/L   Glucose, Bld 138 (H)  70 - 99 mg/dL    Comment: Glucose reference range applies only to samples taken after fasting for at least 8 hours.   BUN 35 (H) 6 - 20 mg/dL   Creatinine, Ser 60.45 (H) 0.44 - 1.00 mg/dL   Calcium 9.7 8.9 - 40.9 mg/dL   Total Protein 7.4 6.5 - 8.1 g/dL   Albumin 3.9 3.5 - 5.0 g/dL   AST 11 (L) 15 - 41 U/L   ALT 9 0 - 44 U/L   Alkaline Phosphatase 126 38 - 126 U/L   Total Bilirubin 1.1 0.0 - 1.2 mg/dL   GFR, Estimated 4 (L) >60 mL/min    Comment: (NOTE) Calculated using the CKD-EPI Creatinine Equation (2021)    Anion gap 18 (H) 5 - 15    Comment: Performed at [facility], [address], [place], [place] [address]  CBC     Status: Abnormal   Collection Time: 10/20/23 12:08 PM  Result Value Ref Range   WBC 3.3 (L) 4.0 - 10.5 K/uL   RBC 3.26 (L) 3.87 - 5.11 MIL/uL   Hemoglobin 9.2 (L) 12.0 - 15.0 g/dL   HCT 47.8 (L) 29.5 - 62.1 %   MCV 92.9 80.0 - 100.0 fL   MCH 28.2 26.0 - 34.0 pg   MCHC 30.4 30.0 - 36.0 g/dL   RDW 30.8 (  H) 11.5 - 15.5 %   Platelets 107 (L) 150 - 400 K/uL   nRBC 0.0 0.0 - 0.2 %    Comment: Performed at [facility], [address], [place], [place] [address]  Culture, blood (Routine X 2) w Reflex to ID Panel     Status: None (Preliminary result)   Collection Time: 10/20/23  8:00 PM   Specimen: BLOOD  Result Value Ref Range   Specimen Description BLOOD BLOOD RIGHT ARM    Special Requests      BOTTLES DRAWN AEROBIC ONLY Blood Culture results may not be optimal due to an inadequate volume of blood received in culture bottles   Culture      NO GROWTH < 12 HOURS Performed at [facility], [address] [place], [place] [address]    Report Status PENDING   Glucose, capillary     Status: Abnormal   Collection Time: 10/20/23  9:28 PM  Result Value Ref Range   Glucose-Capillary 124 (H) 70 - 99 mg/dL    Comment: Glucose reference range applies only to samples taken after fasting for at least 8 hours.  Culture, blood (Routine X 2) w Reflex to ID Panel     Status:  None (Preliminary result)   Collection Time: 10/20/23  9:32 PM   Specimen: BLOOD  Result Value Ref Range   Specimen Description BLOOD BLOOD RIGHT WRIST    Special Requests      BOTTLES DRAWN AEROBIC AND ANAEROBIC Blood Culture adequate volume   Culture      NO GROWTH < 12 HOURS Performed at [facility], [address], [place], [place] [address]    Report Status PENDING   Troponin I (High Sensitivity)     Status: None   Collection Time: 10/20/23  9:32 PM  Result Value Ref Range   Troponin I (High Sensitivity) 12 <18 ng/L    Comment: (NOTE) Elevated high sensitivity troponin I (hsTnI) values and significant  changes across serial measurements may suggest ACS but many other  chronic and acute conditions are known to elevate hsTnI results.  Refer to the "Links" section for chest pain algorithms and additional  guidance. Performed at [facility], [address] [place], [place] [address]   Protime-INR     Status: Abnormal   Collection Time: 10/20/23  9:32 PM  Result Value Ref Range   Prothrombin Time 16.8 (H) 11.4 - 15.2 seconds   INR 1.3 (H) 0.8 - 1.2    Comment: (NOTE) INR goal varies based on device and disease states. Performed at [facility], [address], [place], [place] [address]   APTT     Status: None   Collection Time: 10/20/23  9:32 PM  Result Value Ref Range   aPTT 28 24 - 36 seconds    Comment: Performed at [facility], [address] [place], [place] [address]  Urinalysis, Routine w reflex microscopic -Urine, Clean Catch     Status: Abnormal   Collection Time: 10/21/23  1:08 AM  Result Value Ref Range   Color, Urine YELLOW YELLOW   APPearance HAZY (A) CLEAR   Specific Gravity, Urine 1.016 1.005 - 1.030   pH 8.0 5.0 - 8.0   Glucose, UA 150 (A) NEGATIVE mg/dL   Hgb urine dipstick NEGATIVE NEGATIVE   Bilirubin Urine NEGATIVE NEGATIVE   Ketones, ur NEGATIVE NEGATIVE mg/dL   Protein, ur >=578 (A) NEGATIVE mg/dL   Nitrite NEGATIVE NEGATIVE   Leukocytes,Ua  MODERATE (A) NEGATIVE   RBC /  HPF 0-5 0 - 5 RBC/hpf   WBC, UA 6-10 0 - 5 WBC/hpf   Bacteria, UA RARE (A) NONE SEEN   Squamous Epithelial / HPF 0-5 0 - 5 /HPF   Mucus PRESENT    Hyaline Casts, UA PRESENT     Comment: Performed at [facility], [address], [place], [place] [address]  Basic metabolic panel     Status: Abnormal   Collection Time: 10/21/23  4:25 AM  Result Value Ref Range   Sodium 140 135 - 145 mmol/L   Potassium 4.1 3.5 - 5.1 mmol/L   Chloride 105 98 - 111 mmol/L   CO2 19 (L) 22 - 32 mmol/L   Glucose, Bld 141 (H) 70 - 99 mg/dL    Comment: Glucose reference range applies only to samples taken after fasting for at least 8 hours.   BUN 38 (H) 6 - 20 mg/dL   Creatinine, Ser 60.45 (H) 0.44 - 1.00 mg/dL   Calcium 8.5 (L) 8.9 - 10.3 mg/dL   GFR, Estimated 3 (L) >60 mL/min    Comment: (NOTE) Calculated using the CKD-EPI Creatinine Equation (2021)    Anion gap 16 (H) 5 - 15    Comment: Performed at [facility], [address], [place], [place] [address]  CBC     Status: Abnormal   Collection Time: 10/21/23  4:25 AM  Result Value Ref Range   WBC 3.5 (L) 4.0 - 10.5 K/uL   RBC 3.02 (L) 3.87 - 5.11 MIL/uL   Hemoglobin 8.3 (L) 12.0 - 15.0 g/dL   HCT 19.1 (L) 47.8 - 29.5 %   MCV 95.4 80.0 - 100.0 fL   MCH 27.5 26.0 - 34.0 pg   MCHC 28.8 (L) 30.0 - 36.0 g/dL   RDW 62.1 (H) 30.8 - 65.7 %   Platelets 78 (L) 150 - 400 K/uL    Comment: SPECIMEN CHECKED FOR CLOTS Immature Platelet Fraction may be clinically indicated, consider ordering this additional test [id number] REPEATED TO VERIFY PLATELETS APPEAR DECREASED    nRBC 0.0 0.0 - 0.2 %    Comment: Performed at [facility], [address], [place], [place] [address]  Vitamin B12     Status: None   Collection Time: 10/21/23  4:25 AM  Result Value Ref Range   Vitamin B-12 421 180 - 914 pg/mL    Comment: (NOTE) This assay is not validated for testing neonatal or myeloproliferative syndrome specimens for Vitamin B12  levels. Performed at [facility], [address], [place], [place] [address]   Folate     Status: None   Collection Time: 10/21/23  4:25 AM  Result Value Ref Range   Folate 6.8 >5.9 ng/mL    Comment: Performed at [facility], [address] [place], [place] [address]  Phosphorus     Status: Abnormal   Collection Time: 10/21/23  4:25 AM  Result Value Ref Range   Phosphorus 6.4 (H) 2.5 - 4.6 mg/dL    Comment: Performed at [facility], [address], [place], [place] [address]  Heparin level (unfractionated)     Status: Abnormal   Collection Time: 10/21/23  7:34 AM  Result Value Ref Range   Heparin Unfractionated 0.16 (L) 0.30 - 0.70 IU/mL    Comment: (NOTE) The clinical reportable range upper limit is being lowered to >1.10 to align with the FDA approved guidance for the current laboratory assay.  If heparin results are below expected values, and patient dosage has  been confirmed, suggest follow  up testing of antithrombin III levels. Performed at [facility], [address], [place], [place] [address]   Glucose, capillary     Status: Abnormal   Collection Time: 10/21/23  7:39 AM  Result Value Ref Range   Glucose-Capillary 122 (H) 70 - 99 mg/dL    Comment: Glucose reference range applies only to samples taken after fasting for at least 8 hours.    CT ABDOMEN PELVIS W CONTRAST Result Date: 10/20/2023 CLINICAL DATA:  Abdominal pain. EXAM: CT ABDOMEN AND PELVIS WITH CONTRAST TECHNIQUE: Multidetector CT imaging of the abdomen and pelvis was performed using the standard protocol following bolus administration of intravenous contrast. RADIATION DOSE REDUCTION: This exam was performed according to the departmental dose-optimization program which includes automated exposure control, adjustment of the mA and/or kV according to patient size and/or use of iterative reconstruction technique. CONTRAST:  OMNIPAQUE IOHEXOL 300 MG/ML  SOLN COMPARISON:  CT abdomen pelvis dated 09/21/2023.  FINDINGS: Lower chest: The visualized lung bases are clear. There is coronary vascular calcification. No intra-abdominal free air or free fluid. Hepatobiliary: The liver is unremarkable. No biliary dilatation. Cholecystectomy. No retained calcified stone noted in the central CBD. Pancreas: Unremarkable. No pancreatic ductal dilatation or surrounding inflammatory changes. Spleen: Splenomegaly measuring 17 cm in length. Which shaped peripheral hypoenhancement in the midportion of the spleen most consistent with an infarct and seen on the prior CT. Adrenals/Urinary Tract: The adrenal glands are unremarkable. Severe bilateral renal parenchyma atrophy. No hydronephrosis. The visualized ureters appear unremarkable. The urinary bladder is minimally distended. Stomach/Bowel: There is no bowel obstruction or active inflammation. The appendix is normal. Vascular/Lymphatic: Mild aortoiliac atherosclerotic disease. The IVC is unremarkable. No portal venous gas. Diffuse calcification of the mesenteric vessels. No adenopathy. Reproductive: The uterus is anteverted and grossly unremarkable. No suspicious adnexal masses. Other: There is diffuse skin thickening and subcutaneous edema of the anterior pelvic wall. No fluid collection. Musculoskeletal: Osteopenia with degenerative changes and findings of renal osteodystrophy. Fragmentation of the pubic symphysis as seen previously may represent osteomyelitis. Clinical correlation is recommended. No acute osseous pathology. IMPRESSION: 1. No bowel obstruction. Normal appendix. 2. Possible cellulitis of the anterior pelvic wall. No abscess or soft tissue gas. 3. Fragmentation of the pubic symphysis as seen previously may represent osteomyelitis. 4. Splenomegaly with a mid splenic infarct as seen previously. 5. Severe bilateral renal parenchyma atrophy. No hydronephrosis. Electronically Signed   By: Elgie Collard M.D.   On: 10/20/2023 16:00   Outpt HD:  [facility] MWF EDW  120.5kg achieved Friday 4hrs AVF BFR 400/DFR 500 Heparin 1000 bolus and 800/hr Venofer 50 weekly Mircera 200 q2wks due this week Sensipar 30 qtx, calcitriol qtx  Assessment/Plan **Abd pain: complicated by N/V.  Splenic infarct being investigated.  Also with some acute/chronic diarrhea which is also being evaluated by primary.    **ESRD on HD MWF:  Missed HD Monday, will plan for HD today with UF goal to EDW.    **Secondary hyperparathyroidism:  Cont outpt VDRA, sensipar, binder, renal diet.   **Anemia:  Hb 8.3, cont outpt ESA if remains admitted.  Baseline in the 9s recently  **HTN:  hypertensive, on home meds.  Follow with UF with HD.   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  Will monitor.    Will follow, reach out with questions.   Tyler Pita 10/21/2023,  8:49 AM

## 2023-10-21 NOTE — Progress Notes (Signed)
 Pt cut off 1 hr d/t pt had cramping during tx. She said that : "let me off the machine because my body already clear well." Dr. Glenna Fellows notified.   10/21/23 1440  Vitals  Temp 98 F (36.7 C)  Temp Source Oral  BP 130/60  BP Location Right Arm  BP Method Automatic  Patient Position (if appropriate) Lying  Pulse Rate (!) 59  Resp 16  Oxygen Therapy  SpO2 98 %  O2 Device Room Air  During Treatment Monitoring  Intra-Hemodialysis Comments See progress note  Post Treatment  Dialyzer Clearance Lightly streaked  Hemodialysis Intake (mL) 0 mL  Liters Processed 72  Fluid Removed (mL) 900 mL  Tolerated HD Treatment Yes  Post-Hemodialysis Comments Pt cut off 1 hour d/t tired.  AVG/AVF Arterial Site Held (minutes) 10 minutes  AVG/AVF Venous Site Held (minutes) 10 minutes

## 2023-10-21 NOTE — Progress Notes (Signed)
  Echocardiogram 2D Echocardiogram has been performed.  Ocie Doyne RDCS 10/21/2023, 10:14 AM

## 2023-10-22 ENCOUNTER — Telehealth (HOSPITAL_COMMUNITY): Payer: Self-pay | Admitting: Pharmacy Technician

## 2023-10-22 ENCOUNTER — Other Ambulatory Visit (HOSPITAL_COMMUNITY): Payer: Self-pay

## 2023-10-22 DIAGNOSIS — R112 Nausea with vomiting, unspecified: Secondary | ICD-10-CM | POA: Diagnosis not present

## 2023-10-22 DIAGNOSIS — D735 Infarction of spleen: Secondary | ICD-10-CM | POA: Diagnosis not present

## 2023-10-22 LAB — HEPATITIS B SURFACE ANTIGEN: Hepatitis B Surface Ag: NONREACTIVE

## 2023-10-22 LAB — COMPREHENSIVE METABOLIC PANEL WITH GFR
ALT: 8 U/L (ref 0–44)
AST: 11 U/L — ABNORMAL LOW (ref 15–41)
Albumin: 3.5 g/dL (ref 3.5–5.0)
Alkaline Phosphatase: 104 U/L (ref 38–126)
Anion gap: 12 (ref 5–15)
BUN: 22 mg/dL — ABNORMAL HIGH (ref 6–20)
CO2: 26 mmol/L (ref 22–32)
Calcium: 8.6 mg/dL — ABNORMAL LOW (ref 8.9–10.3)
Chloride: 100 mmol/L (ref 98–111)
Creatinine, Ser: 8.29 mg/dL — ABNORMAL HIGH (ref 0.44–1.00)
GFR, Estimated: 5 mL/min — ABNORMAL LOW (ref >60)
Glucose, Bld: 121 mg/dL — ABNORMAL HIGH (ref 70–99)
Potassium: 3.3 mmol/L — ABNORMAL LOW (ref 3.5–5.1)
Sodium: 138 mmol/L (ref 135–145)
Total Bilirubin: 1.3 mg/dL — ABNORMAL HIGH (ref 0.0–1.2)
Total Protein: 6.6 g/dL (ref 6.5–8.1)

## 2023-10-22 LAB — HEPARIN LEVEL (UNFRACTIONATED): Heparin Unfractionated: 0.33 [IU]/mL (ref 0.30–0.70)

## 2023-10-22 LAB — CBC
HCT: 26.5 % — ABNORMAL LOW (ref 36.0–46.0)
Hemoglobin: 8.1 g/dL — ABNORMAL LOW (ref 12.0–15.0)
MCH: 28 pg (ref 26.0–34.0)
MCHC: 30.6 g/dL (ref 30.0–36.0)
MCV: 91.7 fL (ref 80.0–100.0)
Platelets: 92 10*3/uL — ABNORMAL LOW (ref 150–400)
RBC: 2.89 MIL/uL — ABNORMAL LOW (ref 3.87–5.11)
RDW: 18.9 % — ABNORMAL HIGH (ref 11.5–15.5)
WBC: 3.8 10*3/uL — ABNORMAL LOW (ref 4.0–10.5)
nRBC: 0 % (ref 0.0–0.2)

## 2023-10-22 LAB — GLUCOSE, CAPILLARY
Glucose-Capillary: 73 mg/dL (ref 70–99)
Glucose-Capillary: 100 mg/dL — ABNORMAL HIGH (ref 70–99)
Glucose-Capillary: 100 mg/dL — ABNORMAL HIGH (ref 70–99)
Glucose-Capillary: 120 mg/dL — ABNORMAL HIGH (ref 70–99)

## 2023-10-22 LAB — HEPATITIS B SURFACE ANTIBODY, QUANTITATIVE: Hep B S AB Quant (Post): <3.5 m[IU]/mL — ABNORMAL LOW

## 2023-10-22 MED ORDER — APIXABAN 5 MG PO TABS: 5.0000 mg | ORAL_TABLET | Freq: Two times a day (BID) | ORAL | Status: DC

## 2023-10-22 MED ORDER — APIXABAN 5 MG PO TABS
10.0000 mg | ORAL_TABLET | Freq: Two times a day (BID) | ORAL | Status: DC
  Administered 2023-10-22 (×2): 10 mg via ORAL
  Filled 2023-10-22 (×3): qty 2

## 2023-10-22 NOTE — Plan of Care (Signed)

## 2023-10-22 NOTE — Progress Notes (Signed)
 PHARMACY - ANTICOAGULATION CONSULT NOTE  Pharmacy Consult for Heparin>>apixaban Indication:  splenic infarction  Allergies  Allergen Reactions   Codeine Nausea And Vomiting   Tape Other (See Comments) and Rash    Pull skin off.  Paper tape is ok   Patient Measurements: Height: 5\' 7"  (170.2 cm) Weight: 118.6 kg (261 lb 7.5 oz) IBW/kg (Calculated) : 61.6 Heparin Dosing Weight: 89.3 kg   Vital Signs: Temp: 97.9 F (36.6 C) (03/27 1247) Temp Source: Oral (03/27 1000) BP: 154/68 (03/27 1247) Pulse Rate: 66 (03/27 1247)  Labs: Recent Labs    10/20/23 1208 10/20/23 2132 10/21/23 0425 10/21/23 0734 10/21/23 1641 10/21/23 2357 10/22/23 0000  HGB 9.2*  --  8.3*  --   --   --  8.1*  HCT 30.3*  --  28.8*  --   --   --  26.5*  PLT 107*  --  78*  --   --   --  92*  APTT  --  28  --   --   --   --   --   LABPROT  --  16.8*  --   --   --   --   --   INR  --  1.3*  --   --   --   --   --   HEPARINUNFRC  --   --   --  0.16* 0.31 0.33  --   CREATININE 11.55*  --  12.03*  --   --   --  8.29*  TROPONINIHS  --  12  --   --   --   --   --    Estimated Creatinine Clearance: 10.2 mL/min (A) (by C-G formula based on SCr of 8.29 mg/dL (H)).  Medications:  No PTA anticoagulation per chart review Assessment: Martha [name] is a 58 yo female who presented with chronic 2-3 loose water bowel movements at home. They had an acute onset of abdominal pain, severe in nature. Missed their HD appointment yesterday. They were seen for a similar syndrome on 09/22/2023 and diagnosed with splenic infarction, then left AMA. Pharmacy has been consulted to manage this patient's heparin.   New orders to transition patient to apixaban. Of note patient's hemoglobin is borderline low in 8s. No bleeding issues noted on IV heparin.   Goal of Therapy:  Heparin level 0.3-0.7 units/ml Monitor platelets by anticoagulation protocol: Yes   Plan:  Start apixaban 10mg  bid x 7 days this afternoon then transition to 5mg  bid Patient  has no copay for apixaban  Martha [name] PharmD., BCPS Clinical Pharmacist 10/22/2023 1:07 PM

## 2023-10-22 NOTE — Discharge Instructions (Addendum)
 Information on my medicine - ELIQUIS (apixaban)  This medication education was reviewed with me or my healthcare representative as part of my discharge preparation.    Why was Eliquis prescribed for you? Eliquis was prescribed to treat blood clots that may have been found in the veins of your legs (deep vein thrombosis) or in your lungs (pulmonary embolism) and to reduce the risk of them occurring again.  What do You need to know about Eliquis ? The starting dose is 10 mg (two 5 mg tablets) taken TWICE daily for the FIRST SEVEN (7) DAYS, then on 10/29/2023 the dose is reduced to ONE 5 mg tablet taken TWICE daily.  Eliquis may be taken with or without food.   Try to take the dose about the same time in the morning and in the evening. If you have difficulty swallowing the tablet whole please discuss with your pharmacist how to take the medication safely.  Take Eliquis exactly as prescribed and DO NOT stop taking Eliquis without talking to the doctor who prescribed the medication.  Stopping may increase your risk of developing a new blood clot.  Refill your prescription before you run out.  After discharge, you should have regular check-up appointments with your healthcare provider that is prescribing your Eliquis.    What do you do if you miss a dose? If a dose of ELIQUIS is not taken at the scheduled time, take it as soon as possible on the same day and twice-daily administration should be resumed. The dose should not be doubled to make up for a missed dose.  Important Safety Information A possible side effect of Eliquis is bleeding. You should call your healthcare provider right away if you experience any of the following: Bleeding from an injury or your nose that does not stop. Unusual colored urine (red or dark brown) or unusual colored stools (red or black). Unusual bruising for unknown reasons. A serious fall or if you hit your head (even if there is no bleeding).  Some  medicines may interact with Eliquis and might increase your risk of bleeding or clotting while on Eliquis. To help avoid this, consult your healthcare provider or pharmacist prior to using any new prescription or non-prescription medications, including herbals, vitamins, non-steroidal anti-inflammatory drugs (NSAIDs) and supplements.  This website has more information on Eliquis (apixaban): http://www.eliquis.com/eliquis/home

## 2023-10-22 NOTE — TOC Progression Note (Signed)
 Transition of Care [facility]) - Progression Note    Patient Details  Name: Martha [name] MRN: [id number] Date of Birth: 1966-03-28  [facility]) CM/SW Contact  Villa Herb, [place] Phone Number: 10/22/2023, 11:49 AM  Clinical Narrative:    CSW updated by MD that pt will be ready for D/C tmrw and hopes for HD at regular scheduled clinic time. CSW spoke to pt who confirms she goes to [facility] MWF at [employer]. CSW spoke to [place] with HD clinic who states they are happy to accept pt for normal chair time tomorrow, they will need her to arrive by 9:45. CSW also confirmed that HD clinic has her transportation already arranged for her ride home after HD session. CSW updated MD of this and need for D/C summary by 9:30 tomorrow morning. CSW spoke to [place] and arranged transportation for pt and they will pick up at 9. CSW updated MD. TOC to follow.     Barriers to Discharge: Continued Medical Work up  Expected Discharge Plan and Services                         DME Arranged: 3-N-1 DME Agency: [employer] Date DME Agency Contacted: 10/21/23 Time DME Agency Contacted: 1112 Representative spoke with at DME Agency: Ian Malkin             Social Determinants of Health (SDOH) Interventions SDOH Screenings   Food Insecurity: No Food Insecurity (10/20/2023)  Housing: Low Risk  (10/20/2023)  Transportation Needs: No Transportation Needs (10/20/2023)  Utilities: Not At Risk (10/20/2023)  Depression (PHQ2-9): Low Risk  (03/11/2023)  Social Connections: Unknown (10/20/2023)  Tobacco Use: High Risk (10/20/2023)    Readmission Risk Interventions    09/22/2023   10:43 AM 10/18/2021   12:12 PM  Readmission Risk Prevention Plan  Transportation Screening Complete Complete  HRI or Home Care Consult  Complete  Social Work Consult for Recovery Care Planning/Counseling  Complete  Palliative Care Screening  Not Applicable  Medication Review Oceanographer) Complete Complete  HRI or Home  Care Consult Complete   SW Recovery Care/Counseling Consult Complete   Palliative Care Screening Not Applicable   Skilled Nursing Facility Not Applicable

## 2023-10-22 NOTE — Progress Notes (Addendum)
 [facility] Progress Note   Subjective:   Signed off early (AMA) 1hr from HD treatment yesterday. UF 0.9L. C/o cramping w treatment o/w no issues. Says abd feeling much better with the po pain medication.  She is hopeful for d/c today.   Objective Vitals:   10/21/23 2033 10/21/23 2038 10/22/23 0635 10/22/23 0834  BP:   (!) 119/58   Pulse:  71 64   Resp:  20 16   Temp:   98.6 F (37 C)   TempSrc:   Oral   SpO2: 93% 96% 94% 98%  Weight:      Height:       Physical Exam General: up in chair eating breakfast much more comfortable today Eyes: L eye enucleated months ago surrounded by erythema she says from chronic rubbing ENT: MMM Neck:thick CV: RRR Abd: obese, nontender, healed/scar R side, nodular texture to SQ tissue throughout pannus, 2 small scabs L abd which she says were pustules, no drainage, nontender, no suprapubic skin lesions Back: dec BS but overall clear, normal WOB Extr: LUE AVF +t/b, no edema Neuro: conversant, grossly nonfocal other than vision loss  Additional Objective Labs: Basic Metabolic Panel: Recent Labs  Lab 10/20/23 1208 10/21/23 0425 10/22/23 0000  NA 143 140 138  K 4.0 4.1 3.3*  CL 103 105 100  CO2 22 19* 26  GLUCOSE 138* 141* 121*  BUN 35* 38* 22*  CREATININE 11.55* 12.03* 8.29*  CALCIUM 9.7 8.5* 8.6*  PHOS  --  6.4*  --    Liver Function Tests: Recent Labs  Lab 10/20/23 1208 10/22/23 0000  AST 11* 11*  ALT 9 8  ALKPHOS 126 104  BILITOT 1.1 1.3*  PROT 7.4 6.6  ALBUMIN 3.9 3.5   Recent Labs  Lab 10/20/23 1208  LIPASE 27   CBC: Recent Labs  Lab 10/20/23 1208 10/21/23 0425 10/22/23 0000  WBC 3.3* 3.5* 3.8*  HGB 9.2* 8.3* 8.1*  HCT 30.3* 28.8* 26.5*  MCV 92.9 95.4 91.7  PLT 107* 78* 92*   Blood Culture    Component Value Date/Time   SDES BLOOD BLOOD RIGHT WRIST 10/20/2023 2132   SPECREQUEST  10/20/2023 2132    BOTTLES DRAWN AEROBIC AND ANAEROBIC Blood Culture adequate volume   CULT  10/20/2023 2132     NO GROWTH 2 DAYS Performed at [facility], [address] [place], [place] [address]    REPTSTATUS PENDING 10/20/2023 2132    Cardiac Enzymes: No results for input(s): "CKTOTAL", "CKMB", "CKMBINDEX", "TROPONINI" in the last 168 hours. CBG: Recent Labs  Lab 10/20/23 2128 10/21/23 0739 10/21/23 1621 10/21/23 2118 10/22/23 0739  GLUCAP 124* 122* 96 127* 73   Iron Studies: No results for input(s): "IRON", "TIBC", "TRANSFERRIN", "FERRITIN" in the last 72 hours. @lablastinr3 @ Studies/Results: ECHOCARDIOGRAM COMPLETE Result Date: 10/21/2023    ECHOCARDIOGRAM REPORT   Patient Name:   Martha [name] Date of Exam: 10/21/2023 Medical Rec #:  [id number]     Height:       67.0 in Accession #:    [id number]    Weight:       260.0 lb Date of Birth:  August 11, 1965     BSA:          2.261 m Patient Age:    57 years      BP:           166/94 mmHg Patient Gender: F             HR:  65 bpm. [facility] Procedure: 2D Echo, Cardiac Doppler and Color Doppler (Both Spectral and Color            Flow Doppler were utilized during procedure). Indications:    Splenic infarct  History:        Patient has prior history of Echocardiogram examinations, most                 recent 11/05/2022. CHF, Abnormal ECG; Risk Factors:Sleep Apnea,                 Hypertension, Diabetes and Dyslipidemia. PAD, ESRD.  Sonographer:    Vern Claude Referring Phys: [id number] [address] Nine  Sonographer Comments: Image acquisition challenging due to uncooperative patient. IMPRESSIONS  1. Left ventricular ejection fraction, by estimation, is 60 to 65%. The left ventricle has normal function. The left ventricle has no regional wall motion abnormalities. Left ventricular diastolic parameters are indeterminate. Elevated left ventricular end-diastolic pressure.  2. Right ventricular systolic function is normal. The right ventricular size is mildly enlarged.  3. The mitral valve is grossly normal. Trivial mitral valve regurgitation. No  evidence of mitral stenosis.  4. The aortic valve was not well visualized. Aortic valve regurgitation is not visualized. No aortic stenosis is present. Comparison(s): No significant change from prior study. FINDINGS  Left Ventricle: Left ventricular ejection fraction, by estimation, is 60 to 65%. The left ventricle has normal function. The left ventricle has no regional wall motion abnormalities. Strain was performed and the global longitudinal strain is indeterminate. The left ventricular internal cavity size was normal in size. There is no left ventricular hypertrophy. Left ventricular diastolic parameters are indeterminate. Elevated left ventricular end-diastolic pressure. Right Ventricle: The right ventricular size is mildly enlarged. No increase in right ventricular wall thickness. Right ventricular systolic function is normal. Left Atrium: Left atrial size was normal in size. Right Atrium: Right atrial size was normal in size. Pericardium: There is no evidence of pericardial effusion. Mitral Valve: The mitral valve is grossly normal. Trivial mitral valve regurgitation. No evidence of mitral valve stenosis. MV peak gradient, 8.1 mmHg. The mean mitral valve gradient is 2.0 mmHg. Tricuspid Valve: The tricuspid valve is grossly normal. Tricuspid valve regurgitation is mild . No evidence of tricuspid stenosis. Aortic Valve: The aortic valve was not well visualized. Aortic valve regurgitation is not visualized. No aortic stenosis is present. Aortic valve mean gradient measures 6.0 mmHg. Aortic valve peak gradient measures 13.1 mmHg. Aortic valve area, by VTI measures 2.31 cm. Pulmonic Valve: The pulmonic valve was not well visualized. Pulmonic valve regurgitation is trivial. No evidence of pulmonic stenosis. Aorta: The aortic root and ascending aorta are structurally normal, with no evidence of dilitation. Venous: The inferior vena cava was not well visualized. IAS/Shunts: The interatrial septum was not assessed.  Additional Comments: 3D was performed not requiring image post processing on an independent workstation and was indeterminate.  LEFT VENTRICLE PLAX 2D LVIDd:         5.60 cm      Diastology LVIDs:         3.40 cm      LV e' medial:    5.77 cm/s LV PW:         0.70 cm      LV E/e' medial:  27.0 LV IVS:        0.70 cm      LV e' lateral:   8.70 cm/s LVOT diam:     2.10 cm  LV E/e' lateral: 17.9 LV SV:         88 LV SV Index:   39 LVOT Area:     3.46 cm  LV Volumes (MOD) LV vol d, MOD A2C: 140.0 ml LV vol d, MOD A4C: 162.0 ml LV vol s, MOD A2C: 50.1 ml LV vol s, MOD A4C: 68.5 ml LV SV MOD A2C:     89.9 ml LV SV MOD A4C:     162.0 ml LV SV MOD BP:      88.5 ml RIGHT VENTRICLE RV Basal diam:  4.40 cm RV Mid diam:    3.00 cm RV S prime:     11.20 cm/s TAPSE (M-mode): 2.4 cm LEFT ATRIUM             Index        RIGHT ATRIUM           Index LA diam:        4.00 cm 1.77 cm/m   RA Area:     17.90 cm LA Vol (A2C):   37.7 ml 16.68 ml/m  RA Volume:   51.30 ml  22.69 ml/m LA Vol (A4C):   52.5 ml 23.22 ml/m LA Biplane Vol: 44.7 ml 19.77 ml/m  AORTIC VALVE                     PULMONIC VALVE AV Area (Vmax):    2.30 cm      PV Vmax:          1.01 m/s AV Area (Vmean):   2.01 cm      PV Peak grad:     4.1 mmHg AV Area (VTI):     2.31 cm      PR End Diast Vel: 7.73 msec AV Vmax:           181.00 cm/s AV Vmean:          109.000 cm/s AV VTI:            0.379 m AV Peak Grad:      13.1 mmHg AV Mean Grad:      6.0 mmHg LVOT Vmax:         120.00 cm/s LVOT Vmean:        63.300 cm/s LVOT VTI:          0.253 m LVOT/AV VTI ratio: 0.67  AORTA Ao Root diam: 3.20 cm Ao Asc diam:  2.80 cm MITRAL VALVE                TRICUSPID VALVE MV Area (PHT): 4.46 cm     TR Peak grad:   25.0 mmHg MV Area VTI:   2.67 cm     TR Vmax:        250.00 cm/s MV Peak grad:  8.1 mmHg MV Mean grad:  2.0 mmHg     SHUNTS MV Vmax:       1.42 m/s     Systemic VTI:  0.25 m MV Vmean:      58.4 cm/s    Systemic Diam: 2.10 cm MV Decel Time: 170 msec MV E velocity:  156.00 cm/s Vishnu Priya Mallipeddi Electronically signed by Winfield Rast Mallipeddi Signature Date/Time: 10/21/2023/11:41:56 AM    Final    CT ABDOMEN PELVIS W CONTRAST Result Date: 10/20/2023 CLINICAL DATA:  Abdominal pain. EXAM: CT ABDOMEN AND PELVIS WITH CONTRAST TECHNIQUE: Multidetector CT imaging of the abdomen and pelvis was performed using the standard protocol following bolus administration of  intravenous contrast. RADIATION DOSE REDUCTION: This exam was performed according to the departmental dose-optimization program which includes automated exposure control, adjustment of the mA and/or kV according to patient size and/or use of iterative reconstruction technique. CONTRAST:  OMNIPAQUE IOHEXOL 300 MG/ML  SOLN COMPARISON:  CT abdomen pelvis dated 09/21/2023. FINDINGS: Lower chest: The visualized lung bases are clear. There is coronary vascular calcification. No intra-abdominal free air or free fluid. Hepatobiliary: The liver is unremarkable. No biliary dilatation. Cholecystectomy. No retained calcified stone noted in the central CBD. Pancreas: Unremarkable. No pancreatic ductal dilatation or surrounding inflammatory changes. Spleen: Splenomegaly measuring 17 cm in length. Which shaped peripheral hypoenhancement in the midportion of the spleen most consistent with an infarct and seen on the prior CT. Adrenals/Urinary Tract: The adrenal glands are unremarkable. Severe bilateral renal parenchyma atrophy. No hydronephrosis. The visualized ureters appear unremarkable. The urinary bladder is minimally distended. Stomach/Bowel: There is no bowel obstruction or active inflammation. The appendix is normal. Vascular/Lymphatic: Mild aortoiliac atherosclerotic disease. The IVC is unremarkable. No portal venous gas. Diffuse calcification of the mesenteric vessels. No adenopathy. Reproductive: The uterus is anteverted and grossly unremarkable. No suspicious adnexal masses. Other: There is diffuse skin thickening  and subcutaneous edema of the anterior pelvic wall. No fluid collection. Musculoskeletal: Osteopenia with degenerative changes and findings of renal osteodystrophy. Fragmentation of the pubic symphysis as seen previously may represent osteomyelitis. Clinical correlation is recommended. No acute osseous pathology. IMPRESSION: 1. No bowel obstruction. Normal appendix. 2. Possible cellulitis of the anterior pelvic wall. No abscess or soft tissue gas. 3. Fragmentation of the pubic symphysis as seen previously may represent osteomyelitis. 4. Splenomegaly with a mid splenic infarct as seen previously. 5. Severe bilateral renal parenchyma atrophy. No hydronephrosis. Electronically Signed   By: Elgie Collard M.D.   On: 10/20/2023 16:00   Medications:  heparin 1,900 Units/hr (10/22/23 0150)    albuterol  2.5 mg Nebulization Q1200   atorvastatin  20 mg Oral Daily   atropine  1 drop Left Eye TID   brimonidine  1 drop Left Eye TID   busPIRone  7.5 mg Oral BID   calcitRIOL  2 mcg Oral Q M,W,F-HD   Chlorhexidine Gluconate Cloth  6 each Topical Q0600   cinacalcet  30 mg Oral Q M,W,F-HD   gabapentin  300 mg Oral QHS   insulin aspart  0-15 Units Subcutaneous TID WC   insulin aspart  0-5 Units Subcutaneous QHS   insulin glargine-yfgn  30 Units Subcutaneous QHS   latanoprost  1 drop Left Eye QHS   mometasone-formoterol  2 puff Inhalation BID   pantoprazole (PROTONIX) IV  40 mg Intravenous Q24H   PARoxetine  20 mg Oral QHS   sodium chloride flush  3 mL Intravenous Q12H   sucroferric oxyhydroxide  1,000 mg Oral TID WC   Outpt HD:  [facility] MWF EDW 120.5kg achieved Friday 4hrs AVF BFR 400/DFR 500 Heparin 1000 bolus and 800/hr Venofer 50 weekly Mircera 200 q2wks due this week Sensipar 30 qtx, calcitriol qtx   Assessment/Plan **Abd pain: complicated by N/V.  Splenic infarct being investigated - TTE 3/26 unrevealing. On tele.  Also with some acute/chronic diarrhea which is also being  evaluated by primary.  Symptoms improved this AM   **ESRD on HD MWF:  Missed HD Monday, had HD Wed (short per pt request), next will be tomorrow.  Below EDW 120.5kg > post wt yesterday 118.6kg but was cramping.  Would reset to 119kg.    **Secondary  hyperparathyroidism:  Cont outpt VDRA, sensipar, binder, renal diet.    **Anemia:  Hb 8.3 > 8.1, cont outpt ESA if remains admitted.  Baseline in the 9s recently.  Noted pancytopenia - WBC in 3s, plt 92 - has been present > 1 yr and fairly stable.   **HTN:  hypertensive, on home meds.  Follow with UF with HD.    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  Will monitor.     Will follow, reach out with questions.   OK for discharge from nephrology perspective.  Estill Bakes MD 10/22/2023, 8:35 AM  [facility] Pager: [phone]

## 2023-10-22 NOTE — Progress Notes (Signed)
 PHARMACY - ANTICOAGULATION CONSULT NOTE  Pharmacy Consult for Heparin Indication:  splenic infarction  Allergies  Allergen Reactions   Codeine Nausea And Vomiting   Tape Other (See Comments) and Rash    Pull skin off.  Paper tape is ok   Patient Measurements: Height: 5\' 7"  (170.2 cm) Weight: 118.6 kg (261 lb 7.5 oz) IBW/kg (Calculated) : 61.6 Heparin Dosing Weight: 89.3 kg   Vital Signs: Temp: 97.9 F (36.6 C) (03/26 1949) Temp Source: Oral (03/26 1440) BP: 121/57 (03/26 1949) Pulse Rate: 71 (03/26 2038)  Labs: Recent Labs    10/20/23 1208 10/20/23 2132 10/21/23 0425 10/21/23 0734 10/21/23 1641 10/21/23 2357 10/22/23 0000  HGB 9.2*  --  8.3*  --   --   --  8.1*  HCT 30.3*  --  28.8*  --   --   --  26.5*  PLT 107*  --  78*  --   --   --  92*  APTT  --  28  --   --   --   --   --   LABPROT  --  16.8*  --   --   --   --   --   INR  --  1.3*  --   --   --   --   --   HEPARINUNFRC  --   --   --  0.16* 0.31 0.33  --   CREATININE 11.55*  --  12.03*  --   --   --  8.29*  TROPONINIHS  --  12  --   --   --   --   --    Estimated Creatinine Clearance: 10.2 mL/min (A) (by C-G formula based on SCr of 8.29 mg/dL (H)).  Medications:  No PTA anticoagulation per chart review Assessment: MB is a 58 yo female who presented with chronic 2-3 loose water bowel movements at home. They had an acute onset of abdominal pain, severe in nature. Missed their HD appointment yesterday. They were seen for a similar syndrome on 09/22/2023 and diagnosed with splenic infarction, then left AMA. Pharmacy has been consulted to manage this patient's heparin.   HL 0.33- therapeutic x2 Hgb 8s Platelets increase to 92 No reports of bleeding or issues with the infusion running continuously documented  Goal of Therapy:  Heparin level 0.3-0.7 units/ml Monitor platelets by anticoagulation protocol: Yes   Plan:  Continue heparin infusion at 1900 units/hr  Follow-up with heparin levels daily Continue  for follow CBC with AM labs  Thank you for allowing pharmacy to participate in this patient's care.   Arabella Merles, PharmD. Clinical Pharmacist 10/22/2023 12:47 AM

## 2023-10-22 NOTE — Progress Notes (Signed)
 [facility] PROGRESS NOTE  Martha [name] (DOB: [birth year]) WUJ:[id number] PCP: Carmel Sacramento, NP  Brief Narrative: Martha [name] is a 58 y.o. female with a history of ESRD (HD on MWF for 7 years), recently diagnosed splenic infarct who presented to the ED on 10/20/2023 with abdominal pain associated with nausea and vomiting, caused her to not go to routine dialysis on 3/24. Symptoms improved with medications in the ED. Heparin started and blood cultures, echocardiogram and cardiac telemetry ordered as work up. Nephrology consulted for initiation of dialysis.   Subjective: Continues to feel much better, really wants to go home today. No fevers.    Objective: BP (!) 154/68 (BP Location: Right Arm)   Pulse 66   Temp 97.9 F (36.6 C)   Resp 18   Ht 5\' 7"  (1.702 m)   Wt 118.6 kg   LMP 08/03/2008 (Exact Date)   SpO2 94%   BMI 40.95 kg/m   Gen: No distress Pulm: Clear, nonlabored  CV: RRR, no MRG GI: Soft, NT, ND, +BS Neuro: Alert and oriented. No new focal deficits.  Ext: LUE AVF +thrill Skin: Diffuse and particularly left calf hard subcutaneous nodularity consistent with soft tissue calcification, nontender.   Assessment & Plan: Abdominal pain, nausea, vomiting: Suspected to be related to splenic infarct.  - Continue analgesics, antiemetics prn and PPI, much improved.  - Anticipate gradual improvement with anticoagulation.  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  - Stopped colchicine standing order ?previous notes suggest gout.   Splenic infarct:  - Continue to monitor blood cultures to r/o bacterial embolic etiology (history of MRSA bacteremia 2021). Echo and cardiac monitoring have been negative thus far. NPV at 48 hours is sufficient for discharge, though this will be late this evening. We will follow cultures through tomorrow morning.  - Since echo negative, blood counts stable without gross bleeding,  will convert heparin to eliquis now. No copay.  ESRD:  - Nephrology consult appreciated to restart routine HD, typically MWF, missed on 3/24, thru LUE AVF. Has 10:00am chair time for routine HD tomorrow. We've arranged transport from here to there and from there home.   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 B12 and folic acid wnl. ?related to splenic sequestration.  - Monitor on anticoagulation.    IDT2DM: Last HbA1c reportedly 5.2%.  - Continue SSI, glargine insulin.     OSA:  - Continue CPAP.     RLS:  - Continue gabapentin.    Depression, anxiety, bipolar disorder: Quiescent.  - Continue long term benzodiazepine, would ideally taper this high risk medication.  - Continue buspar and paroxetine.    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  - Continue gtt's.   Obesity, class 2: Body mass index is 40.95 kg/m.   Tyrone Nine, MD [facility] [email or web address] 10/22/2023, 3:30 PM

## 2023-10-22 NOTE — Telephone Encounter (Signed)
 Patient Product/process development scientist completed.    The patient is insured through [facility] MEDICAID.     Ran test claim for Eliquis 5 mg and the current 30 day co-pay is $0.00.   This test claim was processed through [facility]- copay amounts may vary at other pharmacies due to pharmacy/plan contracts, or as the patient moves through the different stages of their insurance plan.     Roland Earl, CPHT Pharmacy Technician III Certified Patient Advocate [facility] Patient Advocate Team Direct Number: [phone]  Fax: [phone]

## 2023-10-23 DIAGNOSIS — R112 Nausea with vomiting, unspecified: Secondary | ICD-10-CM | POA: Diagnosis not present

## 2023-10-23 LAB — GLUCOSE, CAPILLARY
Glucose-Capillary: 80 mg/dL (ref 70–99)
Glucose-Capillary: 88 mg/dL (ref 70–99)

## 2023-10-23 LAB — CBC
HCT: 29.3 % — ABNORMAL LOW (ref 36.0–46.0)
Hemoglobin: 8.6 g/dL — ABNORMAL LOW (ref 12.0–15.0)
MCH: 27.7 pg (ref 26.0–34.0)
MCHC: 29.4 g/dL — ABNORMAL LOW (ref 30.0–36.0)
MCV: 94.2 fL (ref 80.0–100.0)
Platelets: 85 10*3/uL — ABNORMAL LOW (ref 150–400)
RBC: 3.11 MIL/uL — ABNORMAL LOW (ref 3.87–5.11)
RDW: 18.9 % — ABNORMAL HIGH (ref 11.5–15.5)
WBC: 3 10*3/uL — ABNORMAL LOW (ref 4.0–10.5)
nRBC: 0 % (ref 0.0–0.2)

## 2023-10-23 MED ORDER — HYDRALAZINE HCL 25 MG PO TABS
25.0000 mg | ORAL_TABLET | Freq: Three times a day (TID) | ORAL | Status: DC | PRN
  Administered 2023-10-23: 25 mg via ORAL
  Filled 2023-10-23: qty 1

## 2023-10-23 MED ORDER — APIXABAN 5 MG PO TABS: ORAL_TABLET | ORAL | 0 refills | Status: DC

## 2023-10-23 NOTE — Discharge Summary (Signed)
 Physician Discharge Summary   Patient: Martha [name] MRN: [id number] DOB: [birth year]  Admit date:     10/20/2023  Discharge date: 10/23/23  Discharge Physician: Tyrone Nine   PCP: Carmel Sacramento, NP   Recommendations at discharge:  Follow up with PCP in 1-2 weeks for continued chronic disease management and anticoagulation monitoring. Suggest recheck CBC to monitor pancytopenia.  Consider taper of benzodiazepine. Blood cultures (NGTD at discharge) will be followed after discharge.  Discharge Diagnoses: Principal Problem:   Nausea & vomiting Active Problems:   Depression with anxiety   OSA on CPAP   Abdominal pain   Chronic diarrhea   Pancytopenia (HCC)   Calciphylaxis   Blind   Splenic infarct  Hospital Course: Martha [name] is a 58 y.o. female with a history of ESRD (HD on MWF for 7 years), recently diagnosed splenic infarct who presented to the ED on 10/20/2023 with abdominal pain associated with nausea and vomiting, caused her to not go to routine dialysis on 3/24. Symptoms improved with medications in the ED. Heparin started and blood cultures, echocardiogram and cardiac telemetry ordered as work up. Nephrology consulted for restart of routine HD which was performed.   Work up revealed no cardioembolic source, significant dysrhythmia, nor bacteremia to indicate ongoing cause for infarction. Heparin has been transitioned to eliquis. Anticoagulation will be continued at discharge and blood cultures (no growth at > 48 hours at time of discharge) will continue to be monitored by discharging physician for 5 days. She will be discharged 3/28 AM with transportation arranged to go directly to routine dialysis at 10:00am.   Assessment and Plan: Abdominal pain, nausea, vomiting: Suspected to be related to splenic infarct. Has resolved. 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  - Continue AC as below   Splenic infarct:   - Continue to monitor blood cultures to r/o bacterial embolic etiology (history of MRSA bacteremia 2021). Negative at time of discharge, I will follow these for the complete 5 days and contact patient if significant finding results. Echo and cardiac monitoring have been negative thus far. - Continue eliquis for anticoagulation (no copay per our check)   ESRD:  - Nephrology consult appreciated to restart routine HD, typically MWF, missed on 3/24, thru LUE AVF. Has 10:00am chair time for routine HD today. We've arranged transport from here to there and from there home.    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 B12 and folic acid wnl.   - Monitor at follow up.   IDT2DM: Last HbA1c reportedly 5.2%.  - Continue home Tx   OSA:  - Continue CPAP.     RLS:  - Continue gabapentin.    Depression, anxiety, bipolar disorder: Quiescent.  - Continue long term benzodiazepine, would ideally taper this high risk medication.  - Continue buspar and paroxetine.     HLD:  - Continue statin   Blind Patient is blind in both eyes with the left palpebral irritation/chronic injury due to rubbing  - Continue gtt's.    Obesity, class 2: Body mass index is 40.95 kg/m.    Consultants: Nephrology Procedures performed: Echo, dialysis  Disposition: Home (transporting to dialysis center directly, then home).  Diet recommendation:  Renal diet DISCHARGE MEDICATION: Allergies as of 10/23/2023       Reactions   Codeine Nausea And Vomiting   Tape Other (See Comments), Rash  Pull skin off.  Paper tape is ok        Medication List     TAKE these medications    Advair Diskus 250-50 MCG/ACT Aepb Generic drug: fluticasone-salmeterol Inhale 1 puff into the lungs 2 (two) times daily.   albuterol (2.5 MG/3ML) 0.083% nebulizer solution Commonly known as: PROVENTIL Take 3 mLs (2.5 mg total) by nebulization every 12 (twelve) hours as needed for  wheezing or shortness of breath.   ALPRAZolam 1 MG tablet Commonly known as: XANAX Take 1 tablet (1 mg total) by mouth 4 (four) times daily as needed for anxiety. What changed: when to take this   apixaban 5 MG Tabs tablet Commonly known as: ELIQUIS Take 2 tablets (10 mg total) by mouth 2 (two) times daily for 7 days, THEN 1 tablet (5 mg total) 2 (two) times daily for 23 days. Start taking on: October 23, 2023   atorvastatin 20 MG tablet Commonly known as: LIPITOR Take 20 mg by mouth daily.   brimonidine 0.2 % ophthalmic solution Commonly known as: ALPHAGAN Place 1 drop into the left eye 3 (three) times daily.   busPIRone 7.5 MG tablet Commonly known as: BUSPAR Take 1 tablet (7.5 mg total) by mouth 2 (two) times daily.   cinacalcet 60 MG tablet Commonly known as: SENSIPAR Take 60 mg by mouth daily.   colchicine 0.6 MG tablet TAKE 1 TABLET(0.6 MG) BY MOUTH DAILY   doxercalciferol 4 MCG/2ML injection Commonly known as: HECTOROL Inject 0.5 mLs (1 mcg total) into the vein every Monday, Wednesday, and Friday with hemodialysis.   DRY EYES OP Place 1 drop into the right eye 2 (two) times daily.   FORA V30a Blood Glucose System [employer] premium V10 Test test strip Generic drug: glucose blood   gabapentin 100 MG capsule Commonly known as: NEURONTIN Take 1 capsule (100 mg total) by mouth at bedtime. What changed: how much to take   HYDROcodone-acetaminophen 10-325 MG tablet Commonly known as: NORCO Take 1 tablet by mouth 4 (four) times daily.   Isopto Atropine 1 % ophthalmic solution Generic drug: atropine Place 1 drop into the left eye 3 (three) times daily.   Lantus SoloStar 100 UNIT/ML Solostar Pen Generic drug: insulin glargine Inject 30 Units into the skin daily.   latanoprost 0.005 % ophthalmic solution Commonly known as: XALATAN Place 1 drop into the left eye 2 (two) times daily.   omeprazole 40 MG capsule Commonly known as: PRILOSEC Take 40 mg by  mouth daily.   ondansetron 4 MG tablet Commonly known as: ZOFRAN TAKE (1) TABLET THREE TIMES DAILY AS NEEDED FOR NAUSEA.   PARoxetine 20 MG tablet Commonly known as: PAXIL Take 1 tablet (20 mg total) by mouth every evening. This is to prevent panic attacks What changed:  when to take this additional instructions   Velphoro 500 MG chewable tablet Generic drug: sucroferric oxyhydroxide Chew 1,000 mg by mouth 3 (three) times daily.               Durable Medical Equipment  (From admission, onward)           Start     Ordered   10/21/23 1140  For home use only DME 3 n 1  Once        10/21/23 1140            Follow-up Information     Carmel Sacramento, NP Follow up.   Specialty: Nurse Practitioner Contact information: [phone] [place]  [place] [address] [phone]         [facility] Follow up.   Contact information: [address] [place] [facility] [address] [phone]                Discharge Exam: Filed Weights   10/20/23 1202 10/21/23 1145 10/21/23 1509  Weight: 117.9 kg 119.5 kg 118.6 kg  BP (!) 182/84   Pulse 82   Temp 97.6 F (36.4 C)   Resp 20   Ht 5\' 7"  (1.702 m)   Wt 118.6 kg   LMP 08/03/2008 (Exact Date)   SpO2 100%   BMI 40.95 kg/m   No distress  Condition at discharge: stable  The results of significant diagnostics from this hospitalization (including imaging, microbiology, ancillary and laboratory) are listed below for reference.   Imaging Studies: ECHOCARDIOGRAM COMPLETE Result Date: 10/21/2023    ECHOCARDIOGRAM REPORT   Patient Name:   Martha [name] Date of Exam: 10/21/2023 Medical Rec #:  [id number]     Height:       67.0 in Accession #:    [id number]    Weight:       260.0 lb Date of Birth:  July 08, 1966     BSA:          2.261 m Patient Age:    57 years      BP:           166/94 mmHg Patient Gender: F             HR:           65 bpm. [facility] Procedure: 2D Echo, Cardiac Doppler and  Color Doppler (Both Spectral and Color            Flow Doppler were utilized during procedure). Indications:    Splenic infarct  History:        Patient has prior history of Echocardiogram examinations, most                 recent 11/05/2022. CHF, Abnormal ECG; Risk Factors:Sleep Apnea,                 Hypertension, Diabetes and Dyslipidemia. PAD, ESRD.  Sonographer:    Vern Claude Referring Phys: [id number] Tyrone Nine  Sonographer Comments: Image acquisition challenging due to uncooperative patient. IMPRESSIONS  1. Left ventricular ejection fraction, by estimation, is 60 to 65%. The left ventricle has normal function. The left ventricle has no regional wall motion abnormalities. Left ventricular diastolic parameters are indeterminate. Elevated left ventricular end-diastolic pressure.  2. Right ventricular systolic function is normal. The right ventricular size is mildly enlarged.  3. The mitral valve is grossly normal. Trivial mitral valve regurgitation. No evidence of mitral stenosis.  4. The aortic valve was not well visualized. Aortic valve regurgitation is not visualized. No aortic stenosis is present. Comparison(s): No significant change from prior study. FINDINGS  Left Ventricle: Left ventricular ejection fraction, by estimation, is 60 to 65%. The left ventricle has normal function. The left ventricle has no regional wall motion abnormalities. Strain was performed and the global longitudinal strain is indeterminate. The left ventricular internal cavity size was normal in size. There is no left ventricular hypertrophy. Left ventricular diastolic parameters are indeterminate. Elevated left ventricular end-diastolic pressure. Right Ventricle: The right ventricular size is mildly enlarged. No increase in right ventricular wall thickness. Right ventricular systolic function is normal. Left Atrium: Left atrial size was normal in size. Right Atrium: Right atrial  size was normal in size. Pericardium: There is no evidence  of pericardial effusion. Mitral Valve: The mitral valve is grossly normal. Trivial mitral valve regurgitation. No evidence of mitral valve stenosis. MV peak gradient, 8.1 mmHg. The mean mitral valve gradient is 2.0 mmHg. Tricuspid Valve: The tricuspid valve is grossly normal. Tricuspid valve regurgitation is mild . No evidence of tricuspid stenosis. Aortic Valve: The aortic valve was not well visualized. Aortic valve regurgitation is not visualized. No aortic stenosis is present. Aortic valve mean gradient measures 6.0 mmHg. Aortic valve peak gradient measures 13.1 mmHg. Aortic valve area, by VTI measures 2.31 cm. Pulmonic Valve: The pulmonic valve was not well visualized. Pulmonic valve regurgitation is trivial. No evidence of pulmonic stenosis. Aorta: The aortic root and ascending aorta are structurally normal, with no evidence of dilitation. Venous: The inferior vena cava was not well visualized. IAS/Shunts: The interatrial septum was not assessed. Additional Comments: 3D was performed not requiring image post processing on an independent workstation and was indeterminate.  LEFT VENTRICLE PLAX 2D LVIDd:         5.60 cm      Diastology LVIDs:         3.40 cm      LV e' medial:    5.77 cm/s LV PW:         0.70 cm      LV E/e' medial:  27.0 LV IVS:        0.70 cm      LV e' lateral:   8.70 cm/s LVOT diam:     2.10 cm      LV E/e' lateral: 17.9 LV SV:         88 LV SV Index:   39 LVOT Area:     3.46 cm  LV Volumes (MOD) LV vol d, MOD A2C: 140.0 ml LV vol d, MOD A4C: 162.0 ml LV vol s, MOD A2C: 50.1 ml LV vol s, MOD A4C: 68.5 ml LV SV MOD A2C:     89.9 ml LV SV MOD A4C:     162.0 ml LV SV MOD BP:      88.5 ml RIGHT VENTRICLE RV Basal diam:  4.40 cm RV Mid diam:    3.00 cm RV S prime:     11.20 cm/s TAPSE (M-mode): 2.4 cm LEFT ATRIUM             Index        RIGHT ATRIUM           Index LA diam:        4.00 cm 1.77 cm/m   RA Area:     17.90 cm LA Vol (A2C):   37.7 ml 16.68 ml/m  RA Volume:   51.30 ml  22.69 ml/m  LA Vol (A4C):   52.5 ml 23.22 ml/m LA Biplane Vol: 44.7 ml 19.77 ml/m  AORTIC VALVE                     PULMONIC VALVE AV Area (Vmax):    2.30 cm      PV Vmax:          1.01 m/s AV Area (Vmean):   2.01 cm      PV Peak grad:     4.1 mmHg AV Area (VTI):     2.31 cm      PR End Diast Vel: 7.73 msec AV Vmax:           181.00 cm/s AV Vmean:  109.000 cm/s AV VTI:            0.379 m AV Peak Grad:      13.1 mmHg AV Mean Grad:      6.0 mmHg LVOT Vmax:         120.00 cm/s LVOT Vmean:        63.300 cm/s LVOT VTI:          0.253 m LVOT/AV VTI ratio: 0.67  AORTA Ao Root diam: 3.20 cm Ao Asc diam:  2.80 cm MITRAL VALVE                TRICUSPID VALVE MV Area (PHT): 4.46 cm     TR Peak grad:   25.0 mmHg MV Area VTI:   2.67 cm     TR Vmax:        250.00 cm/s MV Peak grad:  8.1 mmHg MV Mean grad:  2.0 mmHg     SHUNTS MV Vmax:       1.42 m/s     Systemic VTI:  0.25 m MV Vmean:      58.4 cm/s    Systemic Diam: 2.10 cm MV Decel Time: 170 msec MV E velocity: 156.00 cm/s Vishnu Priya Mallipeddi Electronically signed by Winfield Rast Mallipeddi Signature Date/Time: 10/21/2023/11:41:56 AM    Final    CT ABDOMEN PELVIS W CONTRAST Result Date: 10/20/2023 CLINICAL DATA:  Abdominal pain. EXAM: CT ABDOMEN AND PELVIS WITH CONTRAST TECHNIQUE: Multidetector CT imaging of the abdomen and pelvis was performed using the standard protocol following bolus administration of intravenous contrast. RADIATION DOSE REDUCTION: This exam was performed according to the departmental dose-optimization program which includes automated exposure control, adjustment of the mA and/or kV according to patient size and/or use of iterative reconstruction technique. CONTRAST:  OMNIPAQUE IOHEXOL 300 MG/ML  SOLN COMPARISON:  CT abdomen pelvis dated 09/21/2023. FINDINGS: Lower chest: The visualized lung bases are clear. There is coronary vascular calcification. No intra-abdominal free air or free fluid. Hepatobiliary: The liver is unremarkable. No biliary  dilatation. Cholecystectomy. No retained calcified stone noted in the central CBD. Pancreas: Unremarkable. No pancreatic ductal dilatation or surrounding inflammatory changes. Spleen: Splenomegaly measuring 17 cm in length. Which shaped peripheral hypoenhancement in the midportion of the spleen most consistent with an infarct and seen on the prior CT. Adrenals/Urinary Tract: The adrenal glands are unremarkable. Severe bilateral renal parenchyma atrophy. No hydronephrosis. The visualized ureters appear unremarkable. The urinary bladder is minimally distended. Stomach/Bowel: There is no bowel obstruction or active inflammation. The appendix is normal. Vascular/Lymphatic: Mild aortoiliac atherosclerotic disease. The IVC is unremarkable. No portal venous gas. Diffuse calcification of the mesenteric vessels. No adenopathy. Reproductive: The uterus is anteverted and grossly unremarkable. No suspicious adnexal masses. Other: There is diffuse skin thickening and subcutaneous edema of the anterior pelvic wall. No fluid collection. Musculoskeletal: Osteopenia with degenerative changes and findings of renal osteodystrophy. Fragmentation of the pubic symphysis as seen previously may represent osteomyelitis. Clinical correlation is recommended. No acute osseous pathology. IMPRESSION: 1. No bowel obstruction. Normal appendix. 2. Possible cellulitis of the anterior pelvic wall. No abscess or soft tissue gas. 3. Fragmentation of the pubic symphysis as seen previously may represent osteomyelitis. 4. Splenomegaly with a mid splenic infarct as seen previously. 5. Severe bilateral renal parenchyma atrophy. No hydronephrosis. Electronically Signed   By: Elgie Collard M.D.   On: 10/20/2023 16:00    Microbiology: Results for orders placed or performed during the hospital encounter of 10/20/23  Culture, blood (Routine  X 2) w Reflex to ID Panel     Status: None (Preliminary result)   Collection Time: 10/20/23  8:00 PM   Specimen:  BLOOD  Result Value Ref Range Status   Specimen Description BLOOD BLOOD RIGHT ARM  Final   Special Requests   Final    BOTTLES DRAWN AEROBIC ONLY Blood Culture results may not be optimal due to an inadequate volume of blood received in culture bottles   Culture   Final    NO GROWTH 3 DAYS Performed at [facility], [address] [place], [place] [address]    Report Status PENDING  Incomplete  Culture, blood (Routine X 2) w Reflex to ID Panel     Status: None (Preliminary result)   Collection Time: 10/20/23  9:32 PM   Specimen: BLOOD  Result Value Ref Range Status   Specimen Description BLOOD BLOOD RIGHT WRIST  Final   Special Requests   Final    BOTTLES DRAWN AEROBIC AND ANAEROBIC Blood Culture adequate volume   Culture   Final    NO GROWTH 3 DAYS Performed at [facility], [address] [place], [place] [address]    Report Status PENDING  Incomplete    Labs: CBC: Recent Labs  Lab 10/20/23 1208 10/21/23 0425 10/22/23 0000 10/23/23 0454  WBC 3.3* 3.5* 3.8* 3.0*  HGB 9.2* 8.3* 8.1* 8.6*  HCT 30.3* 28.8* 26.5* 29.3*  MCV 92.9 95.4 91.7 94.2  PLT 107* 78* 92* 85*   Basic Metabolic Panel: Recent Labs  Lab 10/20/23 1208 10/21/23 0425 10/22/23 0000  NA 143 140 138  K 4.0 4.1 3.3*  CL 103 105 100  CO2 22 19* 26  GLUCOSE 138* 141* 121*  BUN 35* 38* 22*  CREATININE 11.55* 12.03* 8.29*  CALCIUM 9.7 8.5* 8.6*  PHOS  --  6.4*  --    Liver Function Tests: Recent Labs  Lab 10/20/23 1208 10/22/23 0000  AST 11* 11*  ALT 9 8  ALKPHOS 126 104  BILITOT 1.1 1.3*  PROT 7.4 6.6  ALBUMIN 3.9 3.5   CBG: Recent Labs  Lab 10/22/23 1123 10/22/23 1613 10/22/23 2012 10/23/23 0443 10/23/23 0724  GLUCAP 100* 100* 120* 80 88    Discharge time spent: greater than 30 minutes.  Signed: Tyrone Nine, MD [facility] 10/23/2023

## 2023-10-23 NOTE — Plan of Care (Signed)

## 2023-10-25 LAB — CULTURE, BLOOD (ROUTINE X 2)
Culture: NO GROWTH
Culture: NO GROWTH
Special Requests: ADEQUATE

## 2023-10-27 ENCOUNTER — Encounter: Payer: Self-pay | Admitting: Nurse Practitioner

## 2023-10-27 ENCOUNTER — Ambulatory Visit: Payer: Medicaid Other | Admitting: "Endocrinology

## 2023-10-27 LAB — BENZODIAZEPINES,MS,WB/SP RFX

## 2023-10-27 LAB — OXYCODONES,MS,WB/SP RFX

## 2023-10-27 LAB — OPIATES,MS,WB/SP RFX

## 2023-10-29 LAB — DRUG SCREEN 10 W/CONF, SERUM
Amphetamines, IA: NEGATIVE ng/mL
Barbiturates, IA: NEGATIVE ug/mL
Cocaine & Metabolite, IA: NEGATIVE ng/mL
Methadone, IA: NEGATIVE ng/mL
Phencyclidine, IA: NEGATIVE ng/mL
Propoxyphene, IA: NEGATIVE ng/mL
THC(Marijuana) Metabolite, IA: POSITIVE ng/mL — AB

## 2023-10-29 LAB — THC,MS,WB/SP RFX
Cannabidiol: NEGATIVE ng/mL
Cannabinoid Confirmation: POSITIVE
Tetrahydrocannabinol(THC): 1.8 ng/mL

## 2023-11-03 ENCOUNTER — Other Ambulatory Visit: Payer: Self-pay

## 2023-11-03 ENCOUNTER — Inpatient Hospital Stay (HOSPITAL_COMMUNITY)
Admission: EM | Admit: 2023-11-03 | Discharge: 2023-11-26 | DRG: 579 | Disposition: E | Attending: Internal Medicine | Admitting: Internal Medicine

## 2023-11-03 ENCOUNTER — Inpatient Hospital Stay (HOSPITAL_COMMUNITY)

## 2023-11-03 ENCOUNTER — Emergency Department (HOSPITAL_COMMUNITY)

## 2023-11-03 ENCOUNTER — Encounter (HOSPITAL_COMMUNITY): Payer: Self-pay

## 2023-11-03 DIAGNOSIS — R109 Unspecified abdominal pain: Secondary | ICD-10-CM | POA: Diagnosis not present

## 2023-11-03 DIAGNOSIS — D631 Anemia in chronic kidney disease: Secondary | ICD-10-CM | POA: Diagnosis present

## 2023-11-03 DIAGNOSIS — Z515 Encounter for palliative care: Secondary | ICD-10-CM

## 2023-11-03 DIAGNOSIS — K7581 Nonalcoholic steatohepatitis (NASH): Secondary | ICD-10-CM | POA: Diagnosis present

## 2023-11-03 DIAGNOSIS — E8809 Other disorders of plasma-protein metabolism, not elsewhere classified: Secondary | ICD-10-CM | POA: Diagnosis not present

## 2023-11-03 DIAGNOSIS — I5033 Acute on chronic diastolic (congestive) heart failure: Secondary | ICD-10-CM | POA: Diagnosis present

## 2023-11-03 DIAGNOSIS — Z66 Do not resuscitate: Secondary | ICD-10-CM | POA: Diagnosis not present

## 2023-11-03 DIAGNOSIS — Z91158 Patient's noncompliance with renal dialysis for other reason: Secondary | ICD-10-CM

## 2023-11-03 DIAGNOSIS — J690 Pneumonitis due to inhalation of food and vomit: Secondary | ICD-10-CM | POA: Diagnosis not present

## 2023-11-03 DIAGNOSIS — I132 Hypertensive heart and chronic kidney disease with heart failure and with stage 5 chronic kidney disease, or end stage renal disease: Secondary | ICD-10-CM | POA: Diagnosis present

## 2023-11-03 DIAGNOSIS — J449 Chronic obstructive pulmonary disease, unspecified: Secondary | ICD-10-CM | POA: Diagnosis present

## 2023-11-03 DIAGNOSIS — J9601 Acute respiratory failure with hypoxia: Secondary | ICD-10-CM | POA: Diagnosis present

## 2023-11-03 DIAGNOSIS — Z794 Long term (current) use of insulin: Secondary | ICD-10-CM

## 2023-11-03 DIAGNOSIS — D735 Infarction of spleen: Secondary | ICD-10-CM | POA: Diagnosis present

## 2023-11-03 DIAGNOSIS — F419 Anxiety disorder, unspecified: Secondary | ICD-10-CM | POA: Diagnosis present

## 2023-11-03 DIAGNOSIS — I2489 Other forms of acute ischemic heart disease: Secondary | ICD-10-CM | POA: Diagnosis not present

## 2023-11-03 DIAGNOSIS — Z860100 Personal history of colon polyps, unspecified: Secondary | ICD-10-CM

## 2023-11-03 DIAGNOSIS — F319 Bipolar disorder, unspecified: Secondary | ICD-10-CM | POA: Diagnosis present

## 2023-11-03 DIAGNOSIS — F17210 Nicotine dependence, cigarettes, uncomplicated: Secondary | ICD-10-CM | POA: Diagnosis present

## 2023-11-03 DIAGNOSIS — E872 Acidosis, unspecified: Secondary | ICD-10-CM | POA: Insufficient documentation

## 2023-11-03 DIAGNOSIS — R509 Fever, unspecified: Secondary | ICD-10-CM | POA: Diagnosis present

## 2023-11-03 DIAGNOSIS — E66813 Obesity, class 3: Secondary | ICD-10-CM | POA: Diagnosis present

## 2023-11-03 DIAGNOSIS — R197 Diarrhea, unspecified: Secondary | ICD-10-CM | POA: Diagnosis not present

## 2023-11-03 DIAGNOSIS — T402X1A Poisoning by other opioids, accidental (unintentional), initial encounter: Secondary | ICD-10-CM | POA: Diagnosis not present

## 2023-11-03 DIAGNOSIS — Z6841 Body Mass Index (BMI) 40.0 and over, adult: Secondary | ICD-10-CM | POA: Diagnosis not present

## 2023-11-03 DIAGNOSIS — Z1152 Encounter for screening for COVID-19: Secondary | ICD-10-CM | POA: Diagnosis not present

## 2023-11-03 DIAGNOSIS — G9340 Encephalopathy, unspecified: Secondary | ICD-10-CM | POA: Diagnosis not present

## 2023-11-03 DIAGNOSIS — K7460 Unspecified cirrhosis of liver: Secondary | ICD-10-CM | POA: Diagnosis present

## 2023-11-03 DIAGNOSIS — I5A Non-ischemic myocardial injury (non-traumatic): Secondary | ICD-10-CM | POA: Diagnosis not present

## 2023-11-03 DIAGNOSIS — E1165 Type 2 diabetes mellitus with hyperglycemia: Secondary | ICD-10-CM | POA: Diagnosis present

## 2023-11-03 DIAGNOSIS — H5789 Other specified disorders of eye and adnexa: Secondary | ICD-10-CM | POA: Diagnosis present

## 2023-11-03 DIAGNOSIS — G2581 Restless legs syndrome: Secondary | ICD-10-CM | POA: Diagnosis present

## 2023-11-03 DIAGNOSIS — K219 Gastro-esophageal reflux disease without esophagitis: Secondary | ICD-10-CM | POA: Diagnosis present

## 2023-11-03 DIAGNOSIS — E079 Disorder of thyroid, unspecified: Secondary | ICD-10-CM | POA: Diagnosis present

## 2023-11-03 DIAGNOSIS — Z781 Physical restraint status: Secondary | ICD-10-CM

## 2023-11-03 DIAGNOSIS — Z79899 Other long term (current) drug therapy: Secondary | ICD-10-CM

## 2023-11-03 DIAGNOSIS — G9341 Metabolic encephalopathy: Secondary | ICD-10-CM | POA: Diagnosis not present

## 2023-11-03 DIAGNOSIS — Z9001 Acquired absence of eye: Secondary | ICD-10-CM

## 2023-11-03 DIAGNOSIS — E1122 Type 2 diabetes mellitus with diabetic chronic kidney disease: Secondary | ICD-10-CM | POA: Diagnosis present

## 2023-11-03 DIAGNOSIS — N2581 Secondary hyperparathyroidism of renal origin: Secondary | ICD-10-CM | POA: Diagnosis present

## 2023-11-03 DIAGNOSIS — Z992 Dependence on renal dialysis: Secondary | ICD-10-CM | POA: Diagnosis not present

## 2023-11-03 DIAGNOSIS — Z7189 Other specified counseling: Secondary | ICD-10-CM | POA: Diagnosis not present

## 2023-11-03 DIAGNOSIS — N186 End stage renal disease: Secondary | ICD-10-CM | POA: Diagnosis present

## 2023-11-03 DIAGNOSIS — L03317 Cellulitis of buttock: Principal | ICD-10-CM

## 2023-11-03 DIAGNOSIS — G4733 Obstructive sleep apnea (adult) (pediatric): Secondary | ICD-10-CM | POA: Diagnosis not present

## 2023-11-03 DIAGNOSIS — D72810 Lymphocytopenia: Secondary | ICD-10-CM | POA: Diagnosis present

## 2023-11-03 DIAGNOSIS — L89316 Pressure-induced deep tissue damage of right buttock: Secondary | ICD-10-CM | POA: Diagnosis present

## 2023-11-03 DIAGNOSIS — E8770 Fluid overload, unspecified: Secondary | ICD-10-CM | POA: Diagnosis present

## 2023-11-03 DIAGNOSIS — H547 Unspecified visual loss: Secondary | ICD-10-CM | POA: Diagnosis present

## 2023-11-03 DIAGNOSIS — E11319 Type 2 diabetes mellitus with unspecified diabetic retinopathy without macular edema: Secondary | ICD-10-CM | POA: Diagnosis present

## 2023-11-03 DIAGNOSIS — D696 Thrombocytopenia, unspecified: Secondary | ICD-10-CM | POA: Diagnosis present

## 2023-11-03 DIAGNOSIS — I4891 Unspecified atrial fibrillation: Secondary | ICD-10-CM | POA: Diagnosis not present

## 2023-11-03 DIAGNOSIS — G4710 Hypersomnia, unspecified: Secondary | ICD-10-CM | POA: Diagnosis not present

## 2023-11-03 DIAGNOSIS — R1030 Lower abdominal pain, unspecified: Secondary | ICD-10-CM | POA: Diagnosis present

## 2023-11-03 DIAGNOSIS — Z7951 Long term (current) use of inhaled steroids: Secondary | ICD-10-CM

## 2023-11-03 DIAGNOSIS — Z91048 Other nonmedicinal substance allergy status: Secondary | ICD-10-CM

## 2023-11-03 DIAGNOSIS — Z885 Allergy status to narcotic agent status: Secondary | ICD-10-CM

## 2023-11-03 DIAGNOSIS — L0231 Cutaneous abscess of buttock: Secondary | ICD-10-CM | POA: Diagnosis not present

## 2023-11-03 DIAGNOSIS — N390 Urinary tract infection, site not specified: Secondary | ICD-10-CM | POA: Diagnosis not present

## 2023-11-03 DIAGNOSIS — M5146 Schmorl's nodes, lumbar region: Secondary | ICD-10-CM | POA: Diagnosis present

## 2023-11-03 DIAGNOSIS — I468 Cardiac arrest due to other underlying condition: Secondary | ICD-10-CM | POA: Diagnosis not present

## 2023-11-03 DIAGNOSIS — E782 Mixed hyperlipidemia: Secondary | ICD-10-CM | POA: Diagnosis present

## 2023-11-03 DIAGNOSIS — R112 Nausea with vomiting, unspecified: Principal | ICD-10-CM | POA: Diagnosis present

## 2023-11-03 DIAGNOSIS — R102 Pelvic and perineal pain: Secondary | ICD-10-CM | POA: Diagnosis present

## 2023-11-03 DIAGNOSIS — I953 Hypotension of hemodialysis: Secondary | ICD-10-CM | POA: Diagnosis not present

## 2023-11-03 DIAGNOSIS — Z7901 Long term (current) use of anticoagulants: Secondary | ICD-10-CM

## 2023-11-03 DIAGNOSIS — N250 Renal osteodystrophy: Secondary | ICD-10-CM | POA: Diagnosis present

## 2023-11-03 DIAGNOSIS — R54 Age-related physical debility: Secondary | ICD-10-CM | POA: Diagnosis present

## 2023-11-03 DIAGNOSIS — F17290 Nicotine dependence, other tobacco product, uncomplicated: Secondary | ICD-10-CM | POA: Diagnosis present

## 2023-11-03 DIAGNOSIS — E65 Localized adiposity: Secondary | ICD-10-CM | POA: Diagnosis present

## 2023-11-03 LAB — URINALYSIS, W/ REFLEX TO CULTURE (INFECTION SUSPECTED)
Bilirubin Urine: NEGATIVE
Glucose, UA: 150 mg/dL — AB
Hgb urine dipstick: NEGATIVE
Ketones, ur: NEGATIVE mg/dL
Leukocytes,Ua: NEGATIVE
Nitrite: NEGATIVE
Protein, ur: >=300 mg/dL — AB
Specific Gravity, Urine: 1.011 (ref 1.005–1.030)
pH: 8 (ref 5.0–8.0)

## 2023-11-03 LAB — CBC WITH DIFFERENTIAL/PLATELET
Abs Immature Granulocytes: 0.06 10*3/uL (ref 0.00–0.07)
Band Neutrophils: 0 %
Basophils Absolute: 0 10*3/uL (ref 0.0–0.1)
Basophils Relative: 0 %
Blasts: 0 %
Eosinophils Absolute: 0 10*3/uL (ref 0.0–0.5)
Eosinophils Relative: 0 %
HCT: 31.7 % — ABNORMAL LOW (ref 36.0–46.0)
Hemoglobin: 9.3 g/dL — ABNORMAL LOW (ref 12.0–15.0)
Immature Granulocytes: 1 %
Lymphocytes Relative: 10 %
Lymphs Abs: 0.6 10*3/uL — ABNORMAL LOW (ref 0.7–4.0)
MCH: 27.8 pg (ref 26.0–34.0)
MCHC: 29.3 g/dL — ABNORMAL LOW (ref 30.0–36.0)
MCV: 94.9 fL (ref 80.0–100.0)
Metamyelocytes Relative: 0 %
Monocytes Absolute: 0.4 10*3/uL (ref 0.1–1.0)
Monocytes Relative: 7 %
Myelocytes: 0 %
Neutro Abs: 4.9 10*3/uL (ref 1.7–7.7)
Neutrophils Relative %: 82 %
Platelets: 82 10*3/uL — ABNORMAL LOW (ref 150–400)
Promyelocytes Relative: 0 %
RBC: 3.34 MIL/uL — ABNORMAL LOW (ref 3.87–5.11)
RDW: 18.6 % — ABNORMAL HIGH (ref 11.5–15.5)
WBC: 5.9 10*3/uL (ref 4.0–10.5)
nRBC: 0 % (ref 0.0–0.2)
nRBC: 0 /100{WBCs}

## 2023-11-03 LAB — TROPONIN I (HIGH SENSITIVITY)
Troponin I (High Sensitivity): 12 ng/L (ref <18)
Troponin I (High Sensitivity): 14 ng/L (ref <18)

## 2023-11-03 LAB — COMPREHENSIVE METABOLIC PANEL WITH GFR
ALT: 7 U/L (ref 0–44)
AST: 15 U/L (ref 15–41)
Albumin: 3.5 g/dL (ref 3.5–5.0)
Alkaline Phosphatase: 104 U/L (ref 38–126)
Anion gap: 20 — ABNORMAL HIGH (ref 5–15)
BUN: 42 mg/dL — ABNORMAL HIGH (ref 6–20)
CO2: 20 mmol/L — ABNORMAL LOW (ref 22–32)
Calcium: 9.3 mg/dL (ref 8.9–10.3)
Chloride: 100 mmol/L (ref 98–111)
Creatinine, Ser: 11.47 mg/dL — ABNORMAL HIGH (ref 0.44–1.00)
GFR, Estimated: 4 mL/min — ABNORMAL LOW (ref >60)
Glucose, Bld: 123 mg/dL — ABNORMAL HIGH (ref 70–99)
Potassium: 5.1 mmol/L (ref 3.5–5.1)
Sodium: 140 mmol/L (ref 135–145)
Total Bilirubin: 1.6 mg/dL — ABNORMAL HIGH (ref 0.0–1.2)
Total Protein: 7.1 g/dL (ref 6.5–8.1)

## 2023-11-03 LAB — RESP PANEL BY RT-PCR (RSV, FLU A&B, COVID)  RVPGX2
Influenza A by PCR: NEGATIVE
Influenza B by PCR: NEGATIVE
Resp Syncytial Virus by PCR: NEGATIVE
SARS Coronavirus 2 by RT PCR: NEGATIVE

## 2023-11-03 LAB — BLOOD GAS, VENOUS
Acid-base deficit: 1.9 mmol/L (ref 0.0–2.0)
Bicarbonate: 21.5 mmol/L (ref 20.0–28.0)
Drawn by: 34092
O2 Saturation: 70.9 %
Patient temperature: 39.4
pCO2, Ven: 34 mmHg — ABNORMAL LOW (ref 44–60)
pH, Ven: 7.41 (ref 7.25–7.43)
pO2, Ven: 50 mmHg — ABNORMAL HIGH (ref 32–45)

## 2023-11-03 LAB — PROTIME-INR
INR: 1.4 — ABNORMAL HIGH (ref 0.8–1.2)
Prothrombin Time: 17.3 s — ABNORMAL HIGH (ref 11.4–15.2)

## 2023-11-03 LAB — CBG MONITORING, ED: Glucose-Capillary: 117 mg/dL — ABNORMAL HIGH (ref 70–99)

## 2023-11-03 LAB — BRAIN NATRIURETIC PEPTIDE: B Natriuretic Peptide: 1300 pg/mL — ABNORMAL HIGH (ref 0.0–100.0)

## 2023-11-03 LAB — LACTIC ACID, PLASMA
Lactic Acid, Venous: 3.8 mmol/L (ref 0.5–1.9)
Lactic Acid, Venous: 2.4 mmol/L (ref 0.5–1.9)

## 2023-11-03 MED ORDER — PIPERACILLIN-TAZOBACTAM 3.375 G IVPB 30 MIN
3.3750 g | Freq: Once | INTRAVENOUS | Status: AC
  Administered 2023-11-03: 3.375 g via INTRAVENOUS
  Filled 2023-11-03: qty 50

## 2023-11-03 MED ORDER — LACTATED RINGERS IV SOLN: INTRAVENOUS | Status: DC

## 2023-11-03 MED ORDER — ACETAMINOPHEN 650 MG RE SUPP
RECTAL | Status: AC
  Administered 2023-11-03: 650 mg via RECTAL
  Filled 2023-11-03: qty 1

## 2023-11-03 MED ORDER — HYDROMORPHONE HCL 1 MG/ML IJ SOLN
0.5000 mg | Freq: Once | INTRAMUSCULAR | Status: AC
  Administered 2023-11-03: 0.5 mg via INTRAVENOUS
  Filled 2023-11-03: qty 0.5

## 2023-11-03 MED ORDER — VANCOMYCIN HCL 2000 MG/400ML IV SOLN
2000.0000 mg | Freq: Once | INTRAVENOUS | Status: AC
  Administered 2023-11-04: 2000 mg via INTRAVENOUS
  Filled 2023-11-03: qty 400

## 2023-11-03 MED ORDER — LORAZEPAM 2 MG/ML IJ SOLN
1.0000 mg | Freq: Once | INTRAMUSCULAR | Status: AC
  Administered 2023-11-03: 1 mg via INTRAVENOUS
  Filled 2023-11-03: qty 1

## 2023-11-03 MED ORDER — HYDROMORPHONE HCL 1 MG/ML IJ SOLN: 1.0000 mg | Freq: Once | INTRAMUSCULAR | Status: DC

## 2023-11-03 MED ORDER — METOCLOPRAMIDE HCL 5 MG/ML IJ SOLN
10.0000 mg | Freq: Once | INTRAMUSCULAR | Status: AC
  Administered 2023-11-03: 10 mg via INTRAVENOUS
  Filled 2023-11-03: qty 2

## 2023-11-03 MED ORDER — ACETAMINOPHEN 650 MG RE SUPP: 650.0000 mg | Freq: Once | RECTAL | Status: AC

## 2023-11-03 NOTE — ED Notes (Signed)
 Patient calling out in pain and saying "I can't do this" and "help me", repetitively. Pt is complaining of 10/10 abd pain and requesting something to help her sleep. Admitting MD notified

## 2023-11-03 NOTE — ED Triage Notes (Signed)
 Pt arrives via [facility] from home with complaints of hurting all over, unsure of length of time she has been in pain. States that her last dialysis was one week ago (last Monday) states that she has not gone because she has been sick.   ED Provider at bedside, Durwin Nora MD. - Pt endorse stomach pains with ED MD

## 2023-11-03 NOTE — ED Provider Notes (Signed)
 [place] EMERGENCY DEPARTMENT AT [facility] Provider Note   CSN: [id number] Arrival date & time: 11/03/23  1628     History {Add pertinent medical, surgical, social history, OB history to HPI:1} No chief complaint on file.   Martha [name] is a 58 y.o. female.  HPI Patient presents for abdominal pain.  Medical history includes COPD, anemia, arthritis, bipolar disorder, CHF, DM, ESRD, cirrhosis.  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  She can only see shapes from her right eye.  Her last dialysis session was 8 days ago.  She has been feeling unwell, which has caused her to miss her dialysis.  Although patient is a poor historian, she does state that she has had some recent lower abdominal pain, nausea, vomiting, and diffuse pain.  Per chart review, her home medications include Xanax, Eliquis, Norco, insulin, Paxil. Home Medications Prior to Admission medications   Medication Sig Start Date End Date Taking? Authorizing Provider  ADVAIR DISKUS 250-50 MCG/ACT AEPB Inhale 1 puff into the lungs 2 (two) times daily. 07/01/22   [provider]  albuterol (PROVENTIL) (2.5 MG/3ML) 0.083% nebulizer solution Take 3 mLs (2.5 mg total) by nebulization every 12 (twelve) hours as needed for wheezing or shortness of breath. 08/05/22   Valentino Nose, NP  ALPRAZolam Prudy Feeler) 1 MG tablet Take 1 tablet (1 mg total) by mouth 4 (four) times daily as needed for anxiety. Patient taking differently: Take 1 mg by mouth 4 (four) times daily. 07/12/20   Rhetta Mura, MD  apixaban (ELIQUIS) 5 MG TABS tablet Take 2 tablets (10 mg total) by mouth 2 (two) times daily for 7 days, THEN 1 tablet (5 mg total) 2 (two) times daily for 23 days. 10/23/23 11/22/23  Tyrone Nine, MD  Artificial Tear Ointment (DRY EYES OP) Place 1 drop into the right eye 2 (two) times daily.    [provider]  atorvastatin (LIPITOR) 20 MG tablet Take 20 mg by mouth daily. 10/21/18    [provider]  atropine (ISOPTO ATROPINE) 1 % ophthalmic solution Place 1 drop into the left eye 3 (three) times daily. 12/27/19   [provider]  Blood Glucose Monitoring Suppl (FORA V30A BLOOD GLUCOSE SYSTEM) DEVI  09/22/20   [provider]  brimonidine (ALPHAGAN) 0.2 % ophthalmic solution Place 1 drop into the left eye 3 (three) times daily. 05/24/21   [provider]  busPIRone (BUSPAR) 7.5 MG tablet Take 1 tablet (7.5 mg total) by mouth 2 (two) times daily. 07/12/20   Rhetta Mura, MD  cinacalcet (SENSIPAR) 60 MG tablet Take 60 mg by mouth daily.    [provider]  colchicine 0.6 MG tablet TAKE 1 TABLET(0.6 MG) BY MOUTH DAILY 06/04/23   Felecia Shelling, DPM  doxercalciferol (HECTOROL) 4 MCG/2ML injection Inject 0.5 mLs (1 mcg total) into the vein every Monday, Wednesday, and Friday with hemodialysis. 12/03/18   Kari Baars, MD  FORACARE PREMIUM V10 TEST test strip  09/22/20   [provider]  gabapentin (NEURONTIN) 100 MG capsule Take 1 capsule (100 mg total) by mouth at bedtime. Patient taking differently: Take 300 mg by mouth at bedtime. 07/12/20   Rhetta Mura, MD  HYDROcodone-acetaminophen (NORCO) 10-325 MG tablet Take 1 tablet by mouth 4 (four) times daily. 08/30/20   [provider]  insulin glargine (LANTUS SOLOSTAR) 100 UNIT/ML Solostar Pen Inject 30 Units into the skin daily.    [provider]  latanoprost Harrel Lemon)  0.005 % ophthalmic solution Place 1 drop into the left eye 2 (two) times daily. 08/22/20   [provider]  omeprazole (PRILOSEC) 40 MG capsule Take 40 mg by mouth daily.    [provider]  ondansetron (ZOFRAN) 4 MG tablet TAKE (1) TABLET THREE TIMES DAILY AS NEEDED FOR NAUSEA. 07/30/22   [provider]  PARoxetine (PAXIL) 20 MG tablet Take 1 tablet (20 mg total) by mouth every evening. This is to prevent panic attacks Patient taking differently: Take 20 mg by  mouth at bedtime. 07/12/20   Rhetta Mura, MD  VELPHORO 500 MG chewable tablet Chew 1,000 mg by mouth 3 (three) times daily. 08/05/23   [provider]      Allergies    Codeine and Tape    Review of Systems   Review of Systems  Gastrointestinal:  Positive for abdominal pain, nausea and vomiting.  All other systems reviewed and are negative.   Physical Exam Updated Vital Signs LMP 08/03/2008 (Exact Date)  Physical Exam Vitals and nursing note reviewed.  Constitutional:      General: She is not in acute distress.    Appearance: Normal appearance. She is well-developed. She is ill-appearing. She is not toxic-appearing or diaphoretic.  HENT:     Head: Normocephalic and atraumatic.     Right Ear: External ear normal.     Left Ear: External ear normal.     Nose: Nose normal.     Mouth/Throat:     Mouth: Mucous membranes are moist.  Eyes:     Comments: Swelling and irritation to left eye.  Patient reports chronic.  Cardiovascular:     Rate and Rhythm: Normal rate and regular rhythm.  Pulmonary:     Effort: Pulmonary effort is normal. No respiratory distress.  Abdominal:     Palpations: Abdomen is soft.     Tenderness: There is abdominal tenderness.  Musculoskeletal:        General: No swelling or deformity.     Cervical back: Normal range of motion and neck supple.  Skin:    General: Skin is warm and dry.     Coloration: Skin is not jaundiced or pale.  Neurological:     General: No focal deficit present.     Mental Status: She is alert and oriented to person, place, and time.  Psychiatric:        Mood and Affect: Mood is anxious.        Behavior: Behavior is agitated.     ED Results / Procedures / Treatments   Labs (all labs ordered are listed, but only abnormal results are displayed) Labs Reviewed - No data to display  EKG None  Radiology No results found.  Procedures Procedures  {Document cardiac monitor, telemetry assessment procedure when  appropriate:1}  Medications Ordered in ED Medications - No data to display  ED Course/ Medical Decision Making/ A&P   {   Click here for ABCD2, HEART and other calculatorsREFRESH Note before signing :1}                              Medical Decision Making Amount and/or Complexity of Data Reviewed Labs: ordered. Radiology: ordered.  Risk Prescription drug management.   This patient presents to the ED for concern of ***, this involves an extensive number of treatment options, and is a complaint that carries with it a high risk of complications and morbidity.  The differential  diagnosis includes ***   Co morbidities that complicate the patient evaluation  ***   Additional history obtained:  Additional history obtained from *** External records from outside source obtained and reviewed including ***   Lab Tests:  I Ordered, and personally interpreted labs.  The pertinent results include:  ***   Imaging Studies ordered:  I ordered imaging studies including ***  I independently visualized and interpreted imaging which showed *** I agree with the radiologist interpretation   Cardiac Monitoring: / EKG:  The patient was maintained on a cardiac monitor.  I personally viewed and interpreted the cardiac monitored which showed an underlying rhythm of: ***   Problem List / ED Course / Critical interventions / Medication management  Patient presenting for vague complaints of lower abdominal pain.  Unable to describe duration of symptoms.  She does report that she has missed dialysis for the past week due to feeling unwell.  During exam, patient had coughing spell and subsequently had episode of repeated emesis.  She does state that she has had recent nausea and vomiting.  She is prescribed opioids as well as benzodiazepines.  There may be a withdrawal component, she does seem mildly tremulous.  She has swelling and erythema to area of left eye.  Per chart review, this is chronic  and secondary to rubbing.  She was hospitalized 2 weeks ago for nausea and vomiting.  Today, vital signs are notable for a fever of 103 degrees.  Despite this, heart rate and breathing rate are normal.  Pain and nausea medicine were ordered.  Broad workup was initiated.***. I ordered medication including ***  for ***  Reevaluation of the patient after these medicines showed that the patient {resolved/improved/worsened:23923::"improved"} I have reviewed the patients home medicines and have made adjustments as needed   Consultations Obtained:  I requested consultation with the ***,  and discussed lab and imaging findings as well as pertinent plan - they recommend: ***   Social Determinants of Health:  ***   Test / Admission - Considered:  ***   {Document critical care time when appropriate:1} {Document review of labs and clinical decision tools ie heart score, Chads2Vasc2 etc:1}  {Document your independent review of radiology images, and any outside records:1} {Document your discussion with family members, caretakers, and with consultants:1} {Document social determinants of health affecting pt's care:1} {Document your decision making why or why not admission, treatments were needed:1} Final Clinical Impression(s) / ED Diagnoses Final diagnoses:  None    Rx / DC Orders ED Discharge Orders     None

## 2023-11-03 NOTE — ED Notes (Signed)
 Patient transported to CT

## 2023-11-03 NOTE — H&P (Signed)
 History and Physical    Patient: Martha [name] ION:[id number] DOB: 1966/02/11 DOA: 11/03/2023 DOS: the patient was seen and examined on 11/04/2023 PCP: Carmel Sacramento, NP  Patient coming from: Home  Chief Complaint:  Chief Complaint  Patient presents with   Generalized Body Aches   HPI: Martha [name] is a 58 y.o. female with medical history significant of  ESRD on HD (MWF) COPD, hypertension, NASH, AOCD, left eye blindness, type 2 diabetes mellitus with retinopathy, RLS, CHF who presents to the emergency department due to lower abdominal pain, nausea, NBNB vomiting.  Abdominal pain was sharp in nature, it was intermittent with no known alleviating/aggravating factors.  Last dialysis was on 10/21/2023 due to feeling unwell.  She denies chest pain, fever, chills.  ED Course:  In the emergency department, she was febrile with a temperature of 103F, BP was 179/74, O2 sat was 98%, but this decreased to 86% on room air while sleeping, she was provided with supplemental oxygen via [place] at 2 LPM with O2 sats improvement to 94% to 97%.  Other vital signs are within normal range.  Workup in the ED showed normocytic anemia and thrombocytopenia.  BMP was normal except for bicarb 20, blood glucose 123, BUN 42, creatinine 1.47.  Anion gap 20.  Lactic acid 3.8 > 2.4.  Troponin 12 > 14, BNP 1300 (this was 1,369 on 08/18/2023).  TSH 5.193.  Influenza A, B, SARS coronavirus 2, RSV was negative. CT chest, abdomen and pelvis without contrast was negative for acute intrathoracic or intra-abdominal/pelvic abnormality.  Erosive changes at the pubic symphysis considered to be secondary to inflammatory process osteitis or infection. Tylenol was given, patient was treated with IV vancomycin and Zosyn.  Reglan was given, Ativan was given, Dilaudid was given.   Review of Systems: Review of systems as noted in the HPI. All other systems reviewed and are negative.   Past Medical History:  Diagnosis Date   Acid reflux    takes  Tums   Anemia    Arthritis    Bipolar 1 disorder (HCC)    Blindness of left eye    left eye removed   Carpal tunnel syndrome, bilateral    Cervical radiculopathy    CHF (congestive heart failure) (HCC)    Chronic kidney disease    Stage 5- 01/25/17   Constipation    COPD (chronic obstructive pulmonary disease) (HCC) 2014   bronchitis   Degenerative disc disease, thoracic    Depression    Diabetes mellitus    Type II   Diabetic retinopathy (HCC)    Dyspnea    when walking   End stage kidney disease (HCC)    M, W, F [facility]   Gout    Hypertension    Non-alcoholic cirrhosis (HCC)    Noncompliance with medication regimen    Noncompliance with medication regimen    Obesity (BMI 30-39.9)    OSA (obstructive sleep apnea)    cpap   Panic attack    RLS (restless legs syndrome)    Past Surgical History:  Procedure Laterality Date   AORTIC ARCH ANGIOGRAPHY N/A 09/11/2020   Procedure: AORTIC ARCH ANGIOGRAPHY;  Surgeon: Nada Libman, MD;  Location: [facility];  Service: Cardiovascular;  Laterality: N/A;   AV FISTULA PLACEMENT Right 01/27/2017   Procedure: ARTERIOVENOUS (AV) FISTULA CREATION-RIGHT ARM;  Surgeon: Sherren Kerns, MD;  Location: [facility] OR;  Service: Vascular;  Laterality: Right;   AV FISTULA PLACEMENT Left 08/03/2018  Procedure: LEFT ARTERIOVENOUS (AV) FISTULA CREATION;  Surgeon: Sherren Kerns, MD;  Location: [facility] OR;  Service: Vascular;  Laterality: Left;   BASCILIC VEIN TRANSPOSITION Left 04/26/2019   Procedure: SECOND STAGE BASILIC VEIN TRANSPOSITION LEFT ARM;  Surgeon: Sherren Kerns, MD;  Location: [facility] OR;  Service: Vascular;  Laterality: Left;   BIOPSY N/A 05/17/2013   Procedure: BIOPSY;  Surgeon: West Bali, MD;  Location: [facility] ORS;  Service: Endoscopy;  Laterality: N/A;   CARPAL TUNNEL RELEASE Left 11/25/2018   Procedure: LEFT CARPAL TUNNEL RELEASE;  Surgeon: Dairl Ponder, MD;  Location: [facility] OR;  Service: Orthopedics;  Laterality: Left;    CATARACT EXTRACTION Right    CHOLECYSTECTOMY N/A 04/27/2014   Procedure: LAPAROSCOPIC CHOLECYSTECTOMY;  Surgeon: Marlane Hatcher, MD;  Location: [facility] ORS;  Service: [name];  Laterality: N/A;   COLONOSCOPY  06/06/2010   ZOX:WRUEAV bleeding secondary to internal hemorrhoids but incomplete evaluation secondary to poor right colon prep/small rectal and sigmoid colon polyps (hyperplastic). PROPOFOL   COLONOSCOPY  May 2013   Dr. Gilliam/[facility]: 5 mm a descending colon polyp, hyperplastic. Adequate bowel prep.   ENDOMETRIAL ABLATION     ENUCLEATION Left 02/04/2018   ENUCLEATION WITH PLACEMENT OF IMPLANT LEFT EYE   ENUCLEATION Left 02/04/2018   Procedure: ENUCLEATION WITH PLACEMENT OF IMPLANT LEFT EYE;  Surgeon: Floydene Flock, MD;  Location: [facility] OR;  Service: Ophthalmology;  Laterality: Left;   ESOPHAGOGASTRODUODENOSCOPY  06/06/2010   WUJ:[id number] erythema and edema of body of stomach, with sessile polypoid lesions. bx benign. no h.pylori   ESOPHAGOGASTRODUODENOSCOPY (EGD) WITH PROPOFOL N/A 05/17/2013   Dr. Darrick Penna: normal esophagus, moderate nodular gastritis, negative path, empiric Savary dilation   EYE SURGERY  07/2017   sx for glaucoma   FLEXIBLE SIGMOIDOSCOPY N/A 05/17/2013   Dr. Darrick Penna: moderate sized internal hemorrhoids   HEMORRHOID BANDING N/A 05/17/2013   Procedure: HEMORRHOID BANDING;  Surgeon: West Bali, MD;  Location: [facility];  Service: Endoscopy;  Laterality: N/A;  2 bands placed   INCISION AND DRAINAGE ABSCESS Left 10/11/2013   Procedure: INCISION AND DRAINAGE AND DEBRIDEMENT LEFT BREAST  ABSCESS;  Surgeon: Marlane Hatcher, MD;  Location: [facility];  Service: [name];  Laterality: Left;   INSERTION OF DIALYSIS CATHETER N/A 06/08/2018   Procedure: INSERTION OF Right internal jugular TUNNELED  DIALYSIS CATHETER;  Surgeon: Chuck Hint, MD;  Location: [facility] OR;  Service: Vascular;  Laterality: N/A;   IRRIGATION AND DEBRIDEMENT ABSCESS Right 06/01/2013   Procedure: INCISION AND  DRAINAGE AND DEBRIDEMENT ABSCESS RIGHT BREAST;  Surgeon: Marlane Hatcher, MD;  Location: [facility];  Service: [name];  Laterality: Right;   LEFT EYE REMOVED Left 01/2018   [facility] [address].   LIGATION OF ARTERIOVENOUS  FISTULA Right 06/08/2018   Procedure: LIGATION OF ARTERIOVENOUS  FISTULA RIGHT ARM;  Surgeon: Chuck Hint, MD;  Location: [facility] OR;  Service: Vascular;  Laterality: Right;   ORIF ANKLE FRACTURE Right 12/17/2018   Procedure: OPEN REDUCTION INTERNAL FIXATION (ORIF) ANKLE FRACTURE;  Surgeon: Felecia Shelling, DPM;  Location: [facility] OR;  Service: Podiatry;  Laterality: Right;   REVISON OF ARTERIOVENOUS FISTULA Left 09/26/2020   Procedure: LEFT ARTERIOVENOUS FISTULA  DISTAL REVASCULARIZATION AND LIGATION ( DRIL ) USING GREATER SAPHENOUS VEIN;  Surgeon: Maeola Harman, MD;  Location: [facility] OR;  Service: Vascular;  Laterality: Left;   SAVORY DILATION N/A 05/17/2013   Procedure: SAVORY DILATION;  Surgeon: West Bali, MD;  Location: [facility];  Service: Endoscopy;  Laterality: N/A;  14/15/16   SKIN FULL THICKNESS GRAFT Left 05/05/2018   Procedure: ABDOMINAL DERMIS FAT SKIN GRAFT FULL THICKNESS LEFT EYE;  Surgeon: Floydene Flock, MD;  Location: [facility] OR;  Service: Ophthalmology;  Laterality: Left;   TUBAL LIGATION     VEIN HARVEST Left 09/26/2020   Procedure: LEFT LEG GREATER SAPHENOUS VEIN HARVEST;  Surgeon: Maeola Harman, MD;  Location: [facility] OR;  Service: Vascular;  Laterality: Left;    Social History:  reports that she has been smoking cigarettes and e-cigarettes. She started smoking about 41 years ago. She has a 37.5 pack-year smoking history. She has never used smokeless tobacco. She reports that she does not drink alcohol and does not use drugs.   Allergies  Allergen Reactions   Codeine Nausea And Vomiting   Tape Other (See Comments) and Rash    Pull skin off.  Paper tape is ok    Family History  Adopted: Yes  Family history unknown:  Yes     Prior to Admission medications   Medication Sig Start Date End Date Taking? Authorizing Provider  ADVAIR DISKUS 250-50 MCG/ACT AEPB Inhale 1 puff into the lungs 2 (two) times daily. 07/01/22  Yes [provider]  albuterol (PROVENTIL) (2.5 MG/3ML) 0.083% nebulizer solution Take 3 mLs (2.5 mg total) by nebulization every 12 (twelve) hours as needed for wheezing or shortness of breath. 08/05/22  Yes Valentino Nose, NP  ALPRAZolam Prudy Feeler) 1 MG tablet Take 1 tablet (1 mg total) by mouth 4 (four) times daily as needed for anxiety. Patient taking differently: Take 1 mg by mouth 4 (four) times daily. 07/12/20  Yes Rhetta Mura, MD  apixaban (ELIQUIS) 5 MG TABS tablet Take 2 tablets (10 mg total) by mouth 2 (two) times daily for 7 days, THEN 1 tablet (5 mg total) 2 (two) times daily for 23 days. 10/23/23 11/22/23 Yes Tyrone Nine, MD  Artificial Tear Ointment (DRY EYES OP) Place 1 drop into the right eye 2 (two) times daily.   Yes [provider]  atorvastatin (LIPITOR) 20 MG tablet Take 20 mg by mouth daily. 10/21/18  Yes [provider]  atropine (ISOPTO ATROPINE) 1 % ophthalmic solution Place 1 drop into the left eye 3 (three) times daily. 12/27/19  Yes [provider]  brimonidine (ALPHAGAN) 0.2 % ophthalmic solution Place 1 drop into the left eye 3 (three) times daily. 05/24/21  Yes [provider]  busPIRone (BUSPAR) 7.5 MG tablet Take 1 tablet (7.5 mg total) by mouth 2 (two) times daily. 07/12/20  Yes Rhetta Mura, MD  cinacalcet (SENSIPAR) 60 MG tablet Take 60 mg by mouth daily.   Yes [provider]  colchicine 0.6 MG tablet TAKE 1 TABLET(0.6 MG) BY MOUTH DAILY 06/04/23  Yes Felecia Shelling, DPM  doxercalciferol (HECTOROL) 4 MCG/2ML injection Inject 0.5 mLs (1 mcg total) into the vein every Monday, Wednesday, and Friday with hemodialysis. 12/03/18  Yes Kari Baars, MD  gabapentin (NEURONTIN) 100 MG capsule Take 1 capsule  (100 mg total) by mouth at bedtime. Patient taking differently: Take 300 mg by mouth at bedtime. 07/12/20  Yes Rhetta Mura, MD  HYDROcodone-acetaminophen (NORCO) 10-325 MG tablet Take 1 tablet by mouth 4 (four) times daily. 08/30/20  Yes [provider]  insulin glargine (LANTUS SOLOSTAR) 100 UNIT/ML Solostar Pen Inject 30 Units into the skin daily.   Yes [provider]  latanoprost (XALATAN) 0.005 % ophthalmic solution Place 1 drop into the left eye 2 (two)  times daily. 08/22/20  Yes [provider]  omeprazole (PRILOSEC) 40 MG capsule Take 40 mg by mouth daily.   Yes [provider]  ondansetron (ZOFRAN) 4 MG tablet TAKE (1) TABLET THREE TIMES DAILY AS NEEDED FOR NAUSEA. 07/30/22  Yes [provider]  PARoxetine (PAXIL) 20 MG tablet Take 1 tablet (20 mg total) by mouth every evening. This is to prevent panic attacks Patient taking differently: Take 20 mg by mouth at bedtime. 07/12/20  Yes Rhetta Mura, MD  VELPHORO 500 MG chewable tablet Chew 1,000 mg by mouth 3 (three) times daily. 08/05/23  Yes [provider]  Blood Glucose Monitoring Suppl (FORA V30A BLOOD GLUCOSE SYSTEM) DEVI  09/22/20   [provider]  Maudie Flakes PREMIUM V10 TEST test strip  09/22/20   [provider]    Physical Exam: BP (!) 116/42 (BP Location: Right Wrist)   Pulse 68   Temp 98.2 F (36.8 C) (Oral)   Resp 18   Ht 5\' 7"  (1.702 m)   Wt 118.4 kg   LMP 08/03/2008 (Exact Date)   SpO2 99%   BMI 40.88 kg/m   [name]: 58 y.o. year-old female well developed well nourished in no acute distress.  Alert and oriented x3. HEENT: NCAT, EOMI Neck: Supple, trachea medial Cardiovascular: Regular rate and rhythm with no rubs or gallops.  No thyromegaly or JVD noted.  No lower extremity edema. 2/4 pulses in all 4 extremities. Respiratory: Clear to auscultation with no wheezes or rales. Good inspiratory effort. Abdomen: Soft, tender to palpation of  lower abdomen.  Nondistended with normal bowel sounds x4 quadrants. Muskuloskeletal: No cyanosis, clubbing or edema noted bilaterally Neuro: CN II-XII intact, strength 5/5 x 4, sensation, reflexes intact Skin: No ulcerative lesions noted or rashes Psychiatry: Judgement and insight appear normal. Mood is appropriate for condition and setting          Labs on Admission:  Basic Metabolic Panel: Recent Labs  Lab 11/03/23 1714  NA 140  K 5.1  CL 100  CO2 20*  GLUCOSE 123*  BUN 42*  CREATININE 11.47*  CALCIUM 9.3   Liver Function Tests: Recent Labs  Lab 11/03/23 1714  AST 15  ALT 7  ALKPHOS 104  BILITOT 1.6*  PROT 7.1  ALBUMIN 3.5   No results for input(s): "LIPASE", "AMYLASE" in the last 168 hours. No results for input(s): "AMMONIA" in the last 168 hours. CBC: Recent Labs  Lab 11/03/23 1714  WBC 5.9  NEUTROABS 4.9  HGB 9.3*  HCT 31.7*  MCV 94.9  PLT 82*   Cardiac Enzymes: No results for input(s): "CKTOTAL", "CKMB", "CKMBINDEX", "TROPONINI" in the last 168 hours.  BNP (last 3 results) Recent Labs    08/28/23 0913 11/03/23 1714  BNP 1,369.0* 1,300.0*    ProBNP (last 3 results) No results for input(s): "PROBNP" in the last 8760 hours.  CBG: Recent Labs  Lab 11/03/23 1645  GLUCAP 117*    Radiological Exams on Admission: CT CHEST ABDOMEN PELVIS WO CONTRAST Addendum Date: 11/03/2023 ADDENDUM REPORT: 11/03/2023 22:36 ADDENDUM: Increased size of hypodense mass in the left lower lobe of thyroid, previously biopsied but given significant interval enlargement, consider emergent thyroid ultrasound follow-up. Electronically Signed   By: Jasmine Pang M.D.   On: 11/03/2023 22:36   Result Date: 11/03/2023 CLINICAL DATA:  Sepsis body aches EXAM: CT CHEST, ABDOMEN AND PELVIS WITHOUT CONTRAST TECHNIQUE: Multidetector CT imaging of the chest, abdomen and pelvis was performed following the standard protocol without IV contrast. RADIATION  DOSE REDUCTION: This exam was  performed according to the departmental dose-optimization program which includes automated exposure control, adjustment of the mA and/or kV according to patient size and/or use of iterative reconstruction technique. COMPARISON:  CT 10/20/2023, CT cervical spine 11/29/2018, 04/16/2015, 01/06/2023 FINDINGS: CT CHEST FINDINGS Cardiovascular: Limited evaluation without intravenous contrast. Mild atherosclerosis. No aneurysm. Coronary vascular calcification. Upper normal cardiac size. No pericardial effusion Mediastinum/Nodes: Patent trachea. Hypodense thyroid mass in the left lobe, now measures 3.9 cm, previously 2.3 cm. No suspicious lymph nodes. Esophagus within normal limits. Lungs/Pleura: No acute airspace disease, pleural effusion or pneumothorax. Musculoskeletal: Sternum appears intact. Irregular degenerative changes at T10-T11 which are chronic. No acute osseous abnormality CT ABDOMEN PELVIS FINDINGS Hepatobiliary: No focal liver abnormality is seen. Status post cholecystectomy. No biliary dilatation. Pancreas: Atrophic.  No inflammation Spleen: Enlarged, AP measurement of 19 cm. Adrenals/Urinary Tract: Adrenal glands are within normal limits. Atrophic kidneys. No hydronephrosis. Bladder decompressed Stomach/Bowel: Stomach nonenlarged. No dilated small bowel. No acute bowel wall thickening. Negative appendix Vascular/Lymphatic: Extensive vascular disease. No aneurysm. No suspicious lymph nodes. Reproductive: Uterus and bilateral adnexa are unremarkable. Other: Negative for pelvic effusion or free air. Diffuse generalized subcutaneous edema. Musculoskeletal: Erosive changes at the pubic symphysis left greater than right stable compared to recent priors, progressive compared to 2024. Large Schmorl's node at L1. IMPRESSION: 1. Negative for acute intrathoracic or intra-abdominal/pelvic abnormality. 2. Marked splenomegaly.  Atrophic kidneys. 3. Diffuse generalized subcutaneous edema. 4. Erosive changes at the pubic  symphysis left greater than right stable compared to recent priors, progressive compared to 2024. Findings could be secondary to inflammatory pubis osteitis or infection. 5. Aortic atherosclerosis. Electronically Signed: By: Jasmine Pang M.D. On: 11/03/2023 21:33   DG Chest Port 1 View Result Date: 11/03/2023 CLINICAL DATA:  Questionable sepsis - evaluate for abnormality Diffuse pain.  Missed dialysis. EXAM: PORTABLE CHEST 1 VIEW COMPARISON:  09/22/2023 FINDINGS: Stable cardiomegaly. Unchanged mediastinal contours. Improvement in interstitial thickening from prior likely improving edema. No confluent airspace disease. No large pleural effusion. IMPRESSION: Cardiomegaly with improvement in interstitial thickening from prior, likely improving edema. Electronically Signed   By: Narda Rutherford M.D.   On: 11/03/2023 20:03    EKG: I independently viewed the EKG done and my findings are as followed: Normal sinus rhythm at a rate of 71 bpm  Assessment/Plan Present on Admission:  Acute febrile illness  Morbid obesity (HCC)  Nausea & vomiting  Abdominal pain  RESTLESS LEG SYNDROME  GERD  Principal Problem:   Acute febrile illness Active Problems:   GERD   Morbid obesity (HCC)   RESTLESS LEG SYNDROME   OSA on CPAP   Abdominal pain   Nausea & vomiting   Lactic acidosis   Thrombocytopenia (HCC)  Acute febrile illness Patient was treated with IV vancomycin, Zosyn, we shall continue same at this time with plan to de-escalate/discontinue based on Blood cultures  Nausea, vomiting and abdominal pain Continue Zofran as needed Continue Dilaudid as needed  Elevated BNP ESRD on HD BNP 1,300(this was 1,369 on 08/18/2023) This is possibly due to fluid overload, considering patient does not have a dialysis in about 1 week Nephrologist (Dr. Arrie Aran) was consulted for maintenance dialysis in the morning per EDP  Lactic acidosis Elevated anion gap Lactic acid 3.8 > 2.4, anion gap 20 This is  possibly related to several missed dialysis sessions Nephrology consulted for maintenance dialysis  Obesity class III (BMI of 40.88) Patient was counseled about the cardiovascular and metabolic risk of morbid  obesity. Patient was counseled for diet control, exercise regimen and weight loss.   History of calciphylaxis Continue sensipar as well as Hectorol   Thrombocytopenia Platelets 82, continue to monitor platelet levels  OSA Continue CPAP.     Restless leg syndrome Continue gabapentin.   GERD Continue Protonix   Depression, anxiety, bipolar disorder Continue buspar and paroxetine.     Mixed hyperlipidemia Continue statin  DVT prophylaxis: Eliquis  Code Status: Full code  Family Communication: None at bedside  Consults: Nephrology  Severity of Illness: The appropriate patient status for this patient is INPATIENT. Inpatient status is judged to be reasonable and necessary in order to provide the required intensity of service to ensure the patient's safety. The patient's presenting symptoms, physical exam findings, and initial radiographic and laboratory data in the context of their chronic comorbidities is felt to place them at high risk for further clinical deterioration. Furthermore, it is not anticipated that the patient will be medically stable for discharge from the hospital within 2 midnights of admission.   * I certify that at the point of admission it is my clinical judgment that the patient will require inpatient hospital care spanning beyond 2 midnights from the point of admission due to high intensity of service, high risk for further deterioration and high frequency of surveillance required.*  Author: Frankey Shown, DO 11/04/2023 3:24 AM  For on call review [email or web address].

## 2023-11-03 NOTE — ED Notes (Signed)
 Pt given ice chips per request- pt tolerating well at this time.

## 2023-11-03 NOTE — ED Notes (Signed)
 Patient transported to MRI

## 2023-11-04 DIAGNOSIS — K219 Gastro-esophageal reflux disease without esophagitis: Secondary | ICD-10-CM | POA: Diagnosis not present

## 2023-11-04 DIAGNOSIS — D696 Thrombocytopenia, unspecified: Secondary | ICD-10-CM | POA: Insufficient documentation

## 2023-11-04 DIAGNOSIS — E872 Acidosis, unspecified: Secondary | ICD-10-CM | POA: Insufficient documentation

## 2023-11-04 DIAGNOSIS — Z992 Dependence on renal dialysis: Secondary | ICD-10-CM

## 2023-11-04 DIAGNOSIS — R509 Fever, unspecified: Secondary | ICD-10-CM | POA: Diagnosis not present

## 2023-11-04 DIAGNOSIS — N186 End stage renal disease: Secondary | ICD-10-CM | POA: Diagnosis not present

## 2023-11-04 LAB — HEPATITIS B SURFACE ANTIGEN: Hepatitis B Surface Ag: NONREACTIVE

## 2023-11-04 LAB — CBC
HCT: 27.9 % — ABNORMAL LOW (ref 36.0–46.0)
Hemoglobin: 8.3 g/dL — ABNORMAL LOW (ref 12.0–15.0)
MCH: 28.1 pg (ref 26.0–34.0)
MCHC: 29.7 g/dL — ABNORMAL LOW (ref 30.0–36.0)
MCV: 94.6 fL (ref 80.0–100.0)
Platelets: 67 10*3/uL — ABNORMAL LOW (ref 150–400)
RBC: 2.95 MIL/uL — ABNORMAL LOW (ref 3.87–5.11)
RDW: 18.8 % — ABNORMAL HIGH (ref 11.5–15.5)
WBC: 5.8 10*3/uL (ref 4.0–10.5)
nRBC: 0 % (ref 0.0–0.2)

## 2023-11-04 LAB — RESPIRATORY PANEL BY PCR

## 2023-11-04 LAB — COMPREHENSIVE METABOLIC PANEL WITH GFR
ALT: 8 U/L (ref 0–44)
AST: 9 U/L — ABNORMAL LOW (ref 15–41)
Albumin: 3 g/dL — ABNORMAL LOW (ref 3.5–5.0)
Alkaline Phosphatase: 89 U/L (ref 38–126)
Anion gap: 16 — ABNORMAL HIGH (ref 5–15)
BUN: 46 mg/dL — ABNORMAL HIGH (ref 6–20)
CO2: 21 mmol/L — ABNORMAL LOW (ref 22–32)
Calcium: 9.1 mg/dL (ref 8.9–10.3)
Chloride: 102 mmol/L (ref 98–111)
Creatinine, Ser: 11.86 mg/dL — ABNORMAL HIGH (ref 0.44–1.00)
GFR, Estimated: 3 mL/min — ABNORMAL LOW (ref >60)
Glucose, Bld: 148 mg/dL — ABNORMAL HIGH (ref 70–99)
Potassium: 4.7 mmol/L (ref 3.5–5.1)
Sodium: 139 mmol/L (ref 135–145)
Total Bilirubin: 1.8 mg/dL — ABNORMAL HIGH (ref 0.0–1.2)
Total Protein: 6.4 g/dL — ABNORMAL LOW (ref 6.5–8.1)

## 2023-11-04 LAB — MAGNESIUM: Magnesium: 1.6 mg/dL — ABNORMAL LOW (ref 1.7–2.4)

## 2023-11-04 LAB — LACTIC ACID, PLASMA
Lactic Acid, Venous: 1.5 mmol/L (ref 0.5–1.9)
Lactic Acid, Venous: 1.6 mmol/L (ref 0.5–1.9)

## 2023-11-04 LAB — TSH: TSH: 5.193 u[IU]/mL — ABNORMAL HIGH (ref 0.350–4.500)

## 2023-11-04 LAB — PHOSPHORUS: Phosphorus: 4.1 mg/dL (ref 2.5–4.6)

## 2023-11-04 MED ORDER — HEPARIN BOLUS VIA INFUSION: 1000.0000 [IU] | Freq: Once | INTRAVENOUS | Status: DC

## 2023-11-04 MED ORDER — PAROXETINE HCL 20 MG PO TABS
20.0000 mg | ORAL_TABLET | Freq: Every day | ORAL | Status: DC
  Administered 2023-11-04 – 2023-11-13 (×10): 20 mg via ORAL
  Filled 2023-11-04 (×10): qty 1

## 2023-11-04 MED ORDER — SUCROFERRIC OXYHYDROXIDE 500 MG PO CHEW
1000.0000 mg | CHEWABLE_TABLET | Freq: Three times a day (TID) | ORAL | Status: DC
Start: 2023-11-04 — End: 2023-11-17
  Administered 2023-11-04 – 2023-11-17 (×17): 1000 mg via ORAL
  Filled 2023-11-04 (×43): qty 2

## 2023-11-04 MED ORDER — HYDRALAZINE HCL 20 MG/ML IJ SOLN: 10.0000 mg | INTRAMUSCULAR | Status: DC | PRN

## 2023-11-04 MED ORDER — ATORVASTATIN CALCIUM 20 MG PO TABS
20.0000 mg | ORAL_TABLET | Freq: Every day | ORAL | Status: DC
  Administered 2023-11-04 – 2023-11-16 (×13): 20 mg via ORAL
  Filled 2023-11-04 (×13): qty 1

## 2023-11-04 MED ORDER — CALCITRIOL 0.25 MCG PO CAPS
2.0000 ug | ORAL_CAPSULE | ORAL | Status: DC
  Administered 2023-11-04 – 2023-11-13 (×4): 2 ug via ORAL
  Filled 2023-11-04 (×4): qty 8

## 2023-11-04 MED ORDER — HEPARIN BOLUS VIA INFUSION
1000.0000 [IU] | Freq: Once | INTRAVENOUS | Status: DC
  Filled 2023-11-04: qty 1000

## 2023-11-04 MED ORDER — HYDROMORPHONE HCL 1 MG/ML IJ SOLN
INTRAMUSCULAR | Status: AC
  Filled 2023-11-04: qty 0.5

## 2023-11-04 MED ORDER — APIXABAN 5 MG PO TABS
5.0000 mg | ORAL_TABLET | Freq: Two times a day (BID) | ORAL | Status: DC
Start: 2023-11-04 — End: 2023-11-11
  Administered 2023-11-04 – 2023-11-11 (×14): 5 mg via ORAL
  Filled 2023-11-04 (×15): qty 1

## 2023-11-04 MED ORDER — ALPRAZOLAM 1 MG PO TABS
1.0000 mg | ORAL_TABLET | Freq: Four times a day (QID) | ORAL | Status: DC | PRN
  Administered 2023-11-04 – 2023-11-08 (×8): 1 mg via ORAL
  Filled 2023-11-04 (×8): qty 1

## 2023-11-04 MED ORDER — ACETAMINOPHEN 650 MG RE SUPP: 650.0000 mg | Freq: Four times a day (QID) | RECTAL | Status: DC | PRN

## 2023-11-04 MED ORDER — HYDROMORPHONE HCL 1 MG/ML IJ SOLN
0.5000 mg | INTRAMUSCULAR | Status: DC | PRN
  Administered 2023-11-04 – 2023-11-07 (×8): 0.5 mg via INTRAVENOUS
  Filled 2023-11-04 (×8): qty 0.5

## 2023-11-04 MED ORDER — HYDROMORPHONE HCL 1 MG/ML IJ SOLN
0.5000 mg | Freq: Once | INTRAMUSCULAR | Status: AC
  Administered 2023-11-04: 0.5 mg via INTRAVENOUS

## 2023-11-04 MED ORDER — CINACALCET HCL 30 MG PO TABS: 60.0000 mg | ORAL_TABLET | Freq: Every day | ORAL | Status: DC

## 2023-11-04 MED ORDER — VANCOMYCIN HCL IN DEXTROSE 1-5 GM/200ML-% IV SOLN
1000.0000 mg | INTRAVENOUS | Status: DC
  Administered 2023-11-04: 1000 mg via INTRAVENOUS
  Filled 2023-11-04: qty 200

## 2023-11-04 MED ORDER — PENTAFLUOROPROP-TETRAFLUOROETH EX AERO
1.0000 | INHALATION_SPRAY | CUTANEOUS | Status: DC | PRN
  Administered 2023-11-09: 1 via TOPICAL
  Filled 2023-11-04 (×2): qty 30

## 2023-11-04 MED ORDER — ACETAMINOPHEN 325 MG PO TABS
650.0000 mg | ORAL_TABLET | Freq: Four times a day (QID) | ORAL | Status: DC | PRN
  Administered 2023-11-04 – 2023-11-17 (×11): 650 mg via ORAL
  Filled 2023-11-04 (×14): qty 2

## 2023-11-04 MED ORDER — PIPERACILLIN-TAZOBACTAM IN DEX 2-0.25 GM/50ML IV SOLN
2.2500 g | Freq: Three times a day (TID) | INTRAVENOUS | Status: DC
  Administered 2023-11-04 – 2023-11-05 (×4): 2.25 g via INTRAVENOUS
  Filled 2023-11-04 (×2): qty 50
  Filled 2023-11-04 (×3): qty 2.25
  Filled 2023-11-04 (×3): qty 50

## 2023-11-04 MED ORDER — CHLORHEXIDINE GLUCONATE CLOTH 2 % EX PADS
6.0000 | MEDICATED_PAD | Freq: Every day | CUTANEOUS | Status: DC
  Administered 2023-11-06 – 2023-11-11 (×2): 6 via TOPICAL

## 2023-11-04 MED ORDER — CINACALCET HCL 30 MG PO TABS
30.0000 mg | ORAL_TABLET | ORAL | Status: DC
  Administered 2023-11-04 – 2023-11-16 (×5): 30 mg via ORAL
  Filled 2023-11-04 (×5): qty 1

## 2023-11-04 MED ORDER — ONDANSETRON HCL 4 MG/2ML IJ SOLN
4.0000 mg | Freq: Four times a day (QID) | INTRAMUSCULAR | Status: DC | PRN
  Administered 2023-11-04 (×2): 4 mg via INTRAVENOUS
  Filled 2023-11-04 (×2): qty 2

## 2023-11-04 MED ORDER — ONDANSETRON HCL 4 MG PO TABS
4.0000 mg | ORAL_TABLET | Freq: Four times a day (QID) | ORAL | Status: DC | PRN
  Administered 2023-11-05: 4 mg via ORAL
  Filled 2023-11-04: qty 1

## 2023-11-04 MED ORDER — BUSPIRONE HCL 5 MG PO TABS
7.5000 mg | ORAL_TABLET | Freq: Two times a day (BID) | ORAL | Status: DC
Start: 2023-11-04 — End: 2023-11-14
  Administered 2023-11-04 – 2023-11-14 (×20): 7.5 mg via ORAL
  Filled 2023-11-04 (×5): qty 1
  Filled 2023-11-04: qty 2
  Filled 2023-11-04 (×2): qty 1
  Filled 2023-11-04 (×3): qty 2
  Filled 2023-11-04 (×2): qty 1
  Filled 2023-11-04: qty 2
  Filled 2023-11-04 (×6): qty 1
  Filled 2023-11-04: qty 2

## 2023-11-04 MED ORDER — GABAPENTIN 300 MG PO CAPS
300.0000 mg | ORAL_CAPSULE | Freq: Every day | ORAL | Status: DC
Start: 2023-11-04 — End: 2023-11-10
  Administered 2023-11-04 – 2023-11-09 (×6): 300 mg via ORAL
  Filled 2023-11-04 (×6): qty 1

## 2023-11-04 MED ORDER — PANTOPRAZOLE SODIUM 40 MG PO TBEC
80.0000 mg | DELAYED_RELEASE_TABLET | Freq: Every day | ORAL | Status: DC
  Administered 2023-11-04 – 2023-11-14 (×11): 80 mg via ORAL
  Filled 2023-11-04 (×11): qty 2

## 2023-11-04 MED ORDER — DARBEPOETIN ALFA 60 MCG/0.3ML IJ SOSY
60.0000 ug | PREFILLED_SYRINGE | INTRAMUSCULAR | Status: DC
  Administered 2023-11-04: 60 ug via SUBCUTANEOUS
  Filled 2023-11-04: qty 0.3

## 2023-11-04 NOTE — Procedures (Signed)
 HD Note:  Some information was entered later than the data was gathered due to patient care needs. The stated time with the data is accurate.  Brought patient in bed to unit.  Patient verbalizing "Help me Jesus" and "Thank you Jesus" continuously.  Alert and oriented.   Informed consent signed and in chart.  Verbal consent with two nurses signing consent due to the patient's visual impairment  Access used: Left upper arm fistula  Access issues: initial venous cannulation attempt resulted in the removal of a clot when the needle was removed.  Second cannulation of venous area was successful.  No issues with arterial cannulation. Venous pressures remained high resulting in reduction of BFR to 300 to maintain pressures WDL.  Patient had pain in her lower right abdomen that was treated during treatment.  See MAR  Heparin 1000 u per ml was given at start of the treatment and at the 2 hour 15 min point of treatment per dialysis orders.  Patient began consistent complaint about how she would not be able to continue. At the 45 min until end of treatment.  She decided she would stop treatment with 30 min left to go. See flowsheet.  TX duration: 3.5 hours  Total UF removed: 2400 ml  Hand-off given to patient's nurse.   Transported back to the room   Luria Rosario L. Dareen Piano, RN Kid[facility] Dialysis Unit.

## 2023-11-04 NOTE — ED Notes (Signed)
 Patient requesting CPAP machine. Respiratory called to be made aware

## 2023-11-04 NOTE — ED Notes (Signed)
 Patient returns from MRI with increased agitation and anxiety

## 2023-11-04 NOTE — ED Notes (Addendum)
 Per radiology tech, unable to do patient MRI due to patient stating she "couldn't take it" and would not cooperate

## 2023-11-04 NOTE — Progress Notes (Signed)
   11/04/23 2306  BiPAP/CPAP/SIPAP  BiPAP/CPAP/SIPAP Pt Type Adult  BiPAP/CPAP/SIPAP  (patient's home machine)  Mask Type Full face mask  Dentures removed? Not applicable  EPAP  (patient's home settings)  FiO2 (%) 21 %  Patient Home Machine Yes  Safety Check Completed by RT for Home Unit Yes, no issues noted  Patient Home Mask Yes  Patient Home Tubing Yes  Auto Titrate  (patient on home settings)  Device Plugged into RED Power Outlet Yes  BiPAP/CPAP /SiPAP Vitals  Pulse Rate 99  Resp 18  SpO2 93 %  Bilateral Breath Sounds Clear;Diminished  MEWS Score/Color  MEWS Score 1  MEWS Score Color Green

## 2023-11-04 NOTE — ED Notes (Signed)
 Cleaned of bowel incontinence. Full linen change. Patient complaining of pain to the R buttock and believes she has a boil there. R buttock is red and hardened; sensitive to touch. Moisture barrier cream and mepilex placed on patient

## 2023-11-04 NOTE — ED Notes (Signed)
 Patient had a BM and brief changed.

## 2023-11-04 NOTE — TOC Initial Note (Signed)
 Transition of Care [facility]) - Initial/Assessment Note    Patient Details  Name: Martha [name] MRN: [id number] Date of Birth: 07-May-1966  Transition of Care [facility]) CM/SW Contact:    Isabella Bowens, LCSWA Phone Number: 11/04/2023, 8:38 AM  Clinical Narrative:                  CSW spoke with patient at bedside to assess since she is at risk for readmission due to her high risk score. Patient lives alone with her cat and has a Engineer, civil (consulting) that comes out everyday. Patient was unable to provide the agency the nurse works with. The nurse comes out everyday and do her meals and baths. Patient states that she has a niece who is supportive and for her transportation purposes she uses [employer]. Patient states that she missed dialysis and ever since then she has not been doing well; Patient dialysis center is [facility] in [place]. TOC to follow.   Expected Discharge Plan: Home/Self Care Barriers to Discharge: Continued Medical Work up   Patient Goals and CMS Choice Patient states their goals for this hospitalization and ongoing recovery are:: return back home CMS Medicare.gov Compare Post Acute Care list provided to:: Patient Choice offered to / list presented to : Patient      Expected Discharge Plan and Services In-house Referral: Clinical Social Work   Post Acute Care Choice: Durable Medical Equipment, Home Health, Dialysis Living arrangements for the past 2 months: Apartment                 DME Arranged: 3-N-1, Tub bench     Prior Living Arrangements/Services Living arrangements for the past 2 months: Apartment Lives with:: Self, Other (Comment) (Her cat) Patient language and need for interpreter reviewed:: Yes Do you feel safe going back to the place where you live?: Yes      Need for Family Participation in Patient Care: Yes (Comment) Care giver support system in place?: Yes (comment) Current home services: DME Criminal Activity/Legal Involvement Pertinent to Current  Situation/Hospitalization: No - Comment as needed  Activities of Daily Living      Permission Sought/Granted      Share Information with NAME: Denys     Permission granted to share info w Relationship: Patient     Emotional Assessment Appearance:: Appears stated age Attitude/Demeanor/Rapport: Engaged, Reactive, Screaming Affect (typically observed): Appropriate Orientation: : Oriented to Self, Oriented to Place, Oriented to  Time, Oriented to Situation Alcohol / Substance Use: Tobacco Use Psych Involvement: No (comment)  Admission diagnosis:  Lower abdominal pain [R10.30] Acute febrile illness [R50.9] Acute respiratory failure with hypoxia (HCC) [J96.01] ESRD needing dialysis (HCC) [N18.6, Z99.2] Nausea and vomiting, unspecified vomiting type [R11.2] Patient Active Problem List   Diagnosis Date Noted   Lactic acidosis 11/04/2023   Thrombocytopenia (HCC) 11/04/2023   Acute febrile illness 11/03/2023   Splenic infarct 10/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   Blind 10/20/2023   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   Nausea & vomiting 09/22/2023   Anxiety 09/22/2023   RSV (acute bronchiolitis due to respiratory syncytial virus) 08/28/2023   Open abdominal wall wound 03/11/2023   Opioid dependence (HCC) 10/18/2021   Cellulitis and abscess of hand 10/18/2021   Cellulitis of left hand 10/17/2021   ESRD (end stage renal disease) (HCC) 09/26/2020   Hypoalbuminemia 07/06/2020   Acute respiratory failure with hypoxia (HCC) 07/06/2020   CAP (community acquired pneumonia) 07/05/2020   Trimalleolar fracture of ankle, closed, right, initial encounter 12/17/2018   Pain and  swelling of ankle, right 12/16/2018   Metabolic encephalopathy 09/26/2018   SIRS (systemic inflammatory response syndrome) (HCC) 06/23/2018   UTI (urinary tract infection) 02/26/2018   Volume overload 02/25/2018   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   Chronic diarrhea 10/08/2017   Pancytopenia (HCC)  05/24/2017   Chronic obstructive pulmonary disease (HCC) 05/24/2017   Heart failure (HCC) 05/24/2017   Osteopathy in diseases classified elsewhere, unspecified site 05/24/2017   Anasarca associated with disorder of kidney 04/20/2017   Protein-calorie malnutrition, severe 11/29/2016   HTN (hypertension), malignant 11/20/2016   Bipolar 1 disorder (HCC) 11/20/2016   Noncompliance with medication regimen 11/20/2016   Panic attack 11/20/2016   Controlled type 2 diabetes mellitus without complication, without long-term current use of insulin (HCC) 10/27/2016   Sensory disturbance 10/27/2016   Left hemiparesis (HCC) 10/27/2016   Abdominal pain 04/26/2014   Cholelithiasis 04/26/2014   Hyponatremia 04/26/2014   Gallstones 04/06/2014   Diabetic ulcer of left great toe (HCC) 10/09/2013   Type 2 diabetes mellitus treated with insulin (HCC) 05/30/2013   Hyperglycemia due to diabetes mellitus (HCC) 05/30/2013   Internal hemorrhoids with other complication 04/28/2013   Umbilical hernia 04/15/2013   Obesity, Class III, BMI 40-49.9 (morbid obesity) (HCC) 10/08/2012   Hyperlipidemia 05/13/2010   Morbid obesity (HCC) 05/13/2010   Depression with anxiety 05/13/2010   RESTLESS LEG SYNDROME 05/13/2010   Essential hypertension 05/13/2010   GERD 05/13/2010   RECTAL BLEEDING 05/13/2010   OSA on CPAP 05/13/2010   Dysphagia, oropharyngeal phase 05/13/2010   PCP:  Carmel Sacramento, NP Pharmacy:   [facility] [phone] - [place], [place] - [address] AT [facility] OF [address] & [address] [place] [address] Phone: [phone] Fax: [phone]     Social Drivers of Health (SDOH) Social History: SDOH Screenings   Food Insecurity: No Food Insecurity (10/20/2023)  Housing: Low Risk  (10/20/2023)  Transportation Needs: No Transportation Needs (10/20/2023)  Utilities: Not At Risk (10/20/2023)  Depression (PHQ2-9): Low Risk  (03/11/2023)  Social Connections: Unknown (10/20/2023)   Tobacco Use: High Risk (11/03/2023)   SDOH Interventions:     Readmission Risk Interventions    11/04/2023    8:33 AM 09/22/2023   10:43 AM 10/18/2021   12:12 PM  Readmission Risk Prevention Plan  Transportation Screening Complete Complete Complete  HRI or Home Care Consult   Complete  Social Work Consult for Recovery Care Planning/Counseling   Complete  Palliative Care Screening   Not Applicable  Medication Review Oceanographer) Complete Complete Complete  HRI or Home Care Consult Complete Complete   SW Recovery Care/Counseling Consult Complete Complete   Palliative Care Screening Not Applicable Not Applicable   Skilled Nursing Facility Not Applicable Not Applicable

## 2023-11-04 NOTE — ED Notes (Signed)
 Patient SpO2 decreased to 86% on room air while sleeping. Patient placed on 2L O2 via [place] while awaiting CPAP

## 2023-11-04 NOTE — Consult Note (Addendum)
 ESRD Consult Note  Requesting provider: Mauro Kaufmann Service requesting consult: Hospitalist Reason for consult: ESRD, provision of dialysis Indication for acute dialysis?: End Stage Renal Disease  Outpatient dialysis unit: [facility] MWF Outpatient dialysis prescription:  EDW 120.5kg  4hrs AVF BFR 400/DFR 500 Heparin 1000 bolus and 800/hr Venofer 50 weekly Mircera 200 q2wks Sensipar 30 qtx, calcitriol qtx  Assessment/Recommendations: Martha [name] is a/an 58 y.o. female with a past medical history notable for ESRD on HD admitted with fever  # ESRD: MWF. Poorly compliant. Plan for dialysis today. Could consider extra treatment but likely can maintain schedule. Reassess daily  # Volume/ hypertension: Hypertensive and volume overloaded. Significant body edema on CT. Weighed under EDW on admission but likely not accurate. Receiving IVFs so will stop those. UF with HD today. Watch BP. Stop IV prn hydral   # Anemia of Chronic Kidney Disease: Hemoglobin 8.3. No iron for now given infection. Aranesp ordered.  # Secondary Hyperparathyroidism/Hyperphosphatemia: Ca and phos acceptable. Continue home sensipar and calcitriol  #Fever: unclear source. On abx per primary team  # Uncontrolled type 2 diabetes with hyperglycemia: mgmt per primary  #GOC: briefly discussed with Martha [name]. Patient missing dialysis routinely. Often because she is in pain and generally doesn't feel well. May benefit from palliative care.  # Additional recommendations: - Dose all meds for creatinine clearance < 10 ml/min  - Unless absolutely necessary, no MRIs with gadolinium.  - Implement save arm precautions.  Prefer needle sticks in the dorsum of the hands or wrists.  No blood pressure measurements in arm. - If blood transfusion is requested during hemodialysis sessions, please alert [place] prior to the session.  - Use synthetic opioids (Fentanyl/Dilaudid) if needed  Recommendations were discussed with the  primary team.   History of Present Illness: Martha [name] is a/an 58 y.o. female with a past medical history of ESRD who presents with generalized aches  Patient was unable to give history.  Unclear if this was because she was altered or if because she was in pain and had difficulty giving details.  She often responded I do not know.  I did discuss her care with Martha [name] who is listed as her daughter but is actually not related and is a Development worker, international aid that takes care of the patient.  The patient was recently hospitalized and discharged on 3/28.  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  She was having abdominal pain, nausea, vomiting and it was felt the symptoms were related to the splenic infarct.  Martha [name] states that the patient has not improved since discharge.  She continues to have poor p.o. intake, nausea, vomiting, abdominal pain.  The patient also endorses generalized bodyaches.  The patient states she is unsure when she last had dialysis but Martha [name] said it was Friday; other providers have documented last dialysis is being 3/26.  Martha [name] states that the patient has not been going to dialysis regularly.  She often skips and is noncompliant.  Patient has described the pain in her abdomen as sharp and intermittent.  In the emergency department she was found to be febrile with a temperature of 103 Fahrenheit.  She had some hypoxia that improved with supplemental oxygen.  Lactic acid was initially elevated and then improved.  She was negative for RSV, flu, COVID.  CT of the abdomen did demonstrate erosive changes at the pubic symphysis.   Medications:  Current Facility-Administered Medications  Medication Dose Route Frequency Provider Last Rate Last  Admin   acetaminophen (TYLENOL) tablet 650 mg  650 mg Oral Q6H PRN Adefeso, Oladapo, DO       Or   acetaminophen (TYLENOL) suppository 650 mg  650 mg Rectal Q6H PRN Adefeso, Oladapo, DO       Chlorhexidine Gluconate Cloth 2  % PADS 6 each  6 each Topical Q0600 Darnell Level, MD       HYDROmorphone (DILAUDID) 1 MG/ML injection            HYDROmorphone (DILAUDID) injection 0.5 mg  0.5 mg Intravenous Q3H PRN Adefeso, Oladapo, DO   0.5 mg at 11/04/23 0409   lactated ringers infusion   Intravenous Continuous Gloris Manchester, MD   Stopped at 11/03/23 2320   ondansetron (ZOFRAN) tablet 4 mg  4 mg Oral Q6H PRN Adefeso, Oladapo, DO       Or   ondansetron (ZOFRAN) injection 4 mg  4 mg Intravenous Q6H PRN Adefeso, Oladapo, DO       piperacillin-tazobactam (ZOSYN) IVPB 2.25 g  2.25 g Intravenous Q8H Stevphen Rochester, RPH   Stopped at 11/04/23 0736   vancomycin (VANCOCIN) IVPB 1000 mg/200 mL premix  1,000 mg Intravenous Q M,W,F-HD Stevphen Rochester, RPH         ALLERGIES Codeine and Tape  MEDICAL HISTORY Past Medical History:  Diagnosis Date   Acid reflux    takes Tums   Anemia    Arthritis    Bipolar 1 disorder (HCC)    Blindness of left eye    left eye removed   Carpal tunnel syndrome, bilateral    Cervical radiculopathy    CHF (congestive heart failure) (HCC)    Chronic kidney disease    Stage 5- 01/25/17   Constipation    COPD (chronic obstructive pulmonary disease) (HCC) 2014   bronchitis   Degenerative disc disease, thoracic    Depression    Diabetes mellitus    Type II   Diabetic retinopathy (HCC)    Dyspnea    when walking   End stage kidney disease (HCC)    M, W, F [facility]   Gout    Hypertension    Non-alcoholic cirrhosis (HCC)    Noncompliance with medication regimen    Noncompliance with medication regimen    Obesity (BMI 30-39.9)    OSA (obstructive sleep apnea)    cpap   Panic attack    RLS (restless legs syndrome)      SOCIAL HISTORY Social History   Socioeconomic History   Marital status: Married    Spouse name: Not on file   Number of children: 2   Years of education: Not on file   Highest education level: Not on file  Occupational History   Occupation: Disabled   Tobacco Use   Smoking status: Some Days    Current packs/day: 0.00    Average packs/day: 1.5 packs/day for 25.0 years (37.5 ttl pk-yrs)    Types: Cigarettes, E-cigarettes    Start date: 10/23/1982    Last attempt to quit: 10/23/2007    Years since quitting: 16.0   Smokeless tobacco: Never   Tobacco comments:    I vape once in a while.  Vaping Use   Vaping status: Never Used  Substance and Sexual Activity   Alcohol use: No   Drug use: No   Sexual activity: Yes    Birth control/protection: Surgical  Other Topics Concern   Not on file  Social History Narrative   Lives with fiance.  Social Drivers of Corporate investment banker Strain: Not on file  Food Insecurity: No Food Insecurity (10/20/2023)   Hunger Vital Sign    Worried About Running Out of Food in the Last Year: Never true    Ran Out of Food in the Last Year: Never true  Transportation Needs: No Transportation Needs (10/20/2023)   PRAPARE - Administrator, Civil Service (Medical): No    Lack of Transportation (Non-Medical): No  Physical Activity: Not on file  Stress: Not on file  Social Connections: Unknown (10/20/2023)   Social Connection and Isolation Panel [NHANES]    Frequency of Communication with Friends and Family: More than three times a week    Frequency of Social Gatherings with Friends and Family: Once a week    Attends Religious Services: Never    Database administrator or Organizations: No    Attends Banker Meetings: Never    Marital Status: Patient declined  Catering manager Violence: Not At Risk (10/20/2023)   Humiliation, Afraid, Rape, and Kick questionnaire    Fear of Current or Ex-Partner: No    Emotionally Abused: No    Physically Abused: No    Sexually Abused: No     FAMILY HISTORY Family History  Adopted: Yes  Family history unknown: Yes     Review of Systems: 12 systems were reviewed and negative except per HPI  Physical Exam: Vitals:   11/04/23 0700  11/04/23 0715  BP: (!) 153/63   Pulse: 67   Resp:  18  Temp:    SpO2: 97%    Total I/O In: 100 [IV Piggyback:100] Out: -   Intake/Output Summary (Last 24 hours) at 11/04/2023 0825 Last data filed at 11/04/2023 0736 Gross per 24 hour  Intake 100 ml  Output 150 ml  Net -50 ml   General: Chronically ill-appearing, in pain, mild distress HEENT: anicteric sclera on right, surgical changes and scar of left eye CV: normal rate, no murmurs, 1+ edema in the bilateral lower extremities Lungs: bilateral chest rise, normal wob, clear to auscultation anteriorly Abd: soft, diffusely tender to palpation, non-distended Skin: no visible lesions or rashes Psych: alert, engaged, appropriate mood and affect Neuro: normal speech, oriented to person but not place or time  Test Results Reviewed Lab Results  Component Value Date   NA 139 11/04/2023   K 4.7 11/04/2023   CL 102 11/04/2023   CO2 21 (L) 11/04/2023   BUN 46 (H) 11/04/2023   CREATININE 11.86 (H) 11/04/2023   CALCIUM 9.1 11/04/2023   ALBUMIN 3.0 (L) 11/04/2023   PHOS 4.1 11/04/2023    I have reviewed relevant outside healthcare records

## 2023-11-04 NOTE — Progress Notes (Signed)
 [facility]  PROGRESS NOTE  NICHA HEMANN ZOX:[id number] DOB: [birth year] DOA: 11/03/2023 PCP: Carmel Sacramento, NP   Brief HPI:   58 y.o. female with medical history significant of  ESRD on HD (MWF) COPD, hypertension, NASH, AOCD, left eye blindness, type 2 diabetes mellitus with retinopathy, RLS, CHF who presents to the emergency department due to lower abdominal pain, nausea, NBNB vomiting.  Abdominal pain was sharp in nature, it was intermittent with no known alleviating/aggravating factors.  Last dialysis was on 10/21/2023 due to feeling unwell  BMP was normal except for bicarb 20, blood glucose 123, BUN 42, creatinine 1.47.  Anion gap 20.  Lactic acid 3.8 > 2.4.  Troponin 12 > 14, BNP 1300 (this was 1,369 on 08/18/2023).  TSH 5.193.  Influenza A, B, SARS coronavirus 2, RSV was negative. CT chest, abdomen and pelvis without contrast was negative for acute intrathoracic or intra-abdominal/pelvic abnormality.  Erosive changes at the pubic symphysis considered to be secondary to inflammatory process osteitis or infection.   Assessment/Plan:   Acute febrile illness -Tmax 103 F -Unclear etiology -Started on empiric vancomycin and Zosyn -Blood cultures x 2 are negative to date    Nausea, vomiting and abdominal pain Continue Zofran as needed Continue Dilaudid as needed   Elevated BNP ESRD on HD BNP 1,300(this was 1,369 on 08/18/2023) This is possibly due to fluid overload, considering patient does not have a dialysis in about 1 week Nephrologist (Dr. Arrie Aran) was consulted for maintenance dialysis in the morning per EDP   Lactic acidosis Elevated anion gap Lactic acid 3.8 > 2.4, anion gap 20 This is possibly related to several missed dialysis sessions Nephrology consulted for maintenance dialysis  Hypertension -Blood pressure has been elevated -Start hydralazine as needed   Obesity class III (BMI of 40.88) Patient was counseled about the cardiovascular and metabolic risk of  morbid obesity. Patient was counseled for diet control, exercise regimen and weight loss.    History of calciphylaxis Continue sensipar as well as Hectorol    Thrombocytopenia Platelets 82, continue to monitor platelet levels   OSA Continue CPAP.     Restless leg syndrome Continue gabapentin.    GERD Continue Protonix   Depression, anxiety, bipolar disorder Continue buspar and paroxetine.     Mixed hyperlipidemia Continue statin    Medications     apixaban  5 mg Oral BID   atorvastatin  20 mg Oral Daily   busPIRone  7.5 mg Oral BID   calcitRIOL  2 mcg Oral Q M,W,F   Chlorhexidine Gluconate Cloth  6 each Topical Q0600   cinacalcet  30 mg Oral Q M,W,F   darbepoetin (ARANESP) injection - DIALYSIS  60 mcg Subcutaneous Q Wed-1800   gabapentin  300 mg Oral QHS   pantoprazole  80 mg Oral Daily   PARoxetine  20 mg Oral QHS   sucroferric oxyhydroxide  1,000 mg Oral TID WC     Data Reviewed:   CBG:  Recent Labs  Lab 11/03/23 1645  GLUCAP 117*    SpO2: 97 % O2 Flow Rate (L/min): 2 L/min    Vitals:   11/04/23 1530 11/04/23 1557 11/04/23 1617 11/04/23 1633  BP: (!) 143/69 (!) 144/65 (!) 143/59 (!) 155/66  Pulse: 73 75 74 89  Resp: 11 (!) 22 18 20   Temp:    98.5 F (36.9 C)  TempSrc:    Oral  SpO2: 100% 100% 97% 97%  Weight:      Height:  Data Reviewed:  Basic Metabolic Panel: Recent Labs  Lab 11/03/23 1714 11/04/23 0246  NA 140 139  K 5.1 4.7  CL 100 102  CO2 20* 21*  GLUCOSE 123* 148*  BUN 42* 46*  CREATININE 11.47* 11.86*  CALCIUM 9.3 9.1  MG  --  1.6*  PHOS  --  4.1    CBC: Recent Labs  Lab 11/03/23 1714 11/04/23 0246  WBC 5.9 5.8  NEUTROABS 4.9  --   HGB 9.3* 8.3*  HCT 31.7* 27.9*  MCV 94.9 94.6  PLT 82* 67*    LFT Recent Labs  Lab 11/03/23 1714 11/04/23 0246  AST 15 9*  ALT 7 8  ALKPHOS 104 89  BILITOT 1.6* 1.8*  PROT 7.1 6.4*  ALBUMIN 3.5 3.0*     Antibiotics: Anti-infectives (From admission, onward)     Start     Dose/Rate Route Frequency Ordered Stop   11/04/23 1200  vancomycin (VANCOCIN) IVPB 1000 mg/200 mL premix        1,000 mg 200 mL/hr over 60 Minutes Intravenous Every M-W-F (Hemodialysis) 11/04/23 0250     11/04/23 0600  piperacillin-tazobactam (ZOSYN) IVPB 2.25 g        2.25 g 100 mL/hr over 30 Minutes Intravenous Every 8 hours 11/04/23 0250     11/03/23 2315  vancomycin (VANCOREADY) IVPB 2000 mg/400 mL        2,000 mg 200 mL/hr over 120 Minutes Intravenous  Once 11/03/23 2300 11/04/23 0229   11/03/23 2315  piperacillin-tazobactam (ZOSYN) IVPB 3.375 g        3.375 g 100 mL/hr over 30 Minutes Intravenous  Once 11/03/23 2300 11/04/23 0024        DVT prophylaxis: Eliquis  Code Status: Full code  Family Communication: Discussed with patient's daughter at bedside   CONSULTS nephrology   Subjective   Complains of abdominal discomfort   Objective    Physical Examination:   General-appears in no acute distress Heart-S1-S2, regular, no murmur auscultated Lungs-clear to auscultation bilaterally, no wheezing or crackles auscultated Abdomen-soft, nontender, no organomegaly Extremities-no edema in the lower extremities Neuro-alert, oriented x3, no focal deficit noted   Status is: Inpatient:             Meredeth Ide   [facility] If 7PM-7AM, please contact night-coverage at [email or web address], Office  [phone]   11/04/2023, 4:41 PM  LOS: 1 day

## 2023-11-04 NOTE — ED Notes (Signed)
 Patient is becoming increasingly agitated and continuing to call out constantly asking for help and trying to get out of bed. Admitting MD notified

## 2023-11-04 NOTE — Progress Notes (Signed)
 Patient had been on home CPAP unit, started complaining about it being hot.  Placed mask back on patient and she still started complaining about being hot.  Vitals taken by RN.  Patient did not want mask back on even after temp adjusted in room.  This RT informed patient that her mask was at bedside and unit still running when she was ready to go back on.

## 2023-11-04 NOTE — Progress Notes (Signed)
 Pharmacy Antibiotic Note  Martha [name] is a 58 y.o. female admitted on 11/03/2023 with fever of unknown origin.  Pharmacy has been consulted for Vancomycin/Zosyn dosing. WBC WNL. Pt has ESRD on HD MWF.   Plan: Vancomycin 2000 mg IV x 1, then 1000 mg IV qHD MWF Zosyn 2.25g IV q8h Trend WBC, temp, HD schedule F/U infectious work-up Drug levels as indicated   Height: 5\' 7"  (170.2 cm) Weight: 118.4 kg (261 lb) IBW/kg (Calculated) : 61.6  Temp (24hrs), Avg:99.3 F (37.4 C), Min:98 F (36.7 C), Max:103 F (39.4 C)  Recent Labs  Lab 11/03/23 1714 11/03/23 1921  WBC 5.9  --   CREATININE 11.47*  --   LATICACIDVEN 3.8* 2.4*    Estimated Creatinine Clearance: 7.4 mL/min (A) (by C-G formula based on SCr of 11.47 mg/dL (H)).    Allergies  Allergen Reactions   Codeine Nausea And Vomiting   Tape Other (See Comments) and Rash    Pull skin off.  Paper tape is ok    Abran Duke, PharmD, BCPS Clinical Pharmacist Phone: [phone]

## 2023-11-05 ENCOUNTER — Encounter (HOSPITAL_COMMUNITY): Payer: Self-pay | Admitting: Internal Medicine

## 2023-11-05 DIAGNOSIS — R1030 Lower abdominal pain, unspecified: Secondary | ICD-10-CM | POA: Diagnosis not present

## 2023-11-05 DIAGNOSIS — R509 Fever, unspecified: Secondary | ICD-10-CM | POA: Diagnosis not present

## 2023-11-05 DIAGNOSIS — Z515 Encounter for palliative care: Secondary | ICD-10-CM

## 2023-11-05 DIAGNOSIS — K219 Gastro-esophageal reflux disease without esophagitis: Secondary | ICD-10-CM | POA: Diagnosis not present

## 2023-11-05 DIAGNOSIS — Z7189 Other specified counseling: Secondary | ICD-10-CM | POA: Diagnosis not present

## 2023-11-05 DIAGNOSIS — N186 End stage renal disease: Secondary | ICD-10-CM | POA: Diagnosis not present

## 2023-11-05 LAB — MRSA NEXT GEN BY PCR, NASAL: MRSA by PCR Next Gen: DETECTED — AB

## 2023-11-05 LAB — HEPATITIS B SURFACE ANTIBODY, QUANTITATIVE: Hep B S AB Quant (Post): <3.5 m[IU]/mL — ABNORMAL LOW

## 2023-11-05 MED ORDER — MUPIROCIN 2 % EX OINT
1.0000 | TOPICAL_OINTMENT | Freq: Two times a day (BID) | CUTANEOUS | Status: AC
  Administered 2023-11-05 – 2023-11-09 (×9): 1 via NASAL
  Filled 2023-11-05: qty 22

## 2023-11-05 MED ORDER — ZINC OXIDE 40 % EX OINT
TOPICAL_OINTMENT | Freq: Two times a day (BID) | CUTANEOUS | Status: DC
  Administered 2023-11-05 – 2023-11-14 (×6): 1 via TOPICAL
  Filled 2023-11-05 (×2): qty 57

## 2023-11-05 MED ORDER — NYSTATIN 100000 UNIT/GM EX POWD
Freq: Three times a day (TID) | CUTANEOUS | Status: DC
  Administered 2023-11-06 – 2023-11-07 (×2): 1 via TOPICAL
  Filled 2023-11-05 (×3): qty 15

## 2023-11-05 MED ORDER — CHLORHEXIDINE GLUCONATE CLOTH 2 % EX PADS
6.0000 | MEDICATED_PAD | Freq: Every day | CUTANEOUS | Status: DC
Start: 2023-11-05 — End: 2023-11-10
  Administered 2023-11-05 – 2023-11-10 (×6): 6 via TOPICAL

## 2023-11-05 MED ORDER — SODIUM CHLORIDE 0.9 % IV SOLN
2.2500 g | Freq: Three times a day (TID) | INTRAVENOUS | Status: DC
  Filled 2023-11-05 (×4): qty 10

## 2023-11-05 NOTE — Consult Note (Signed)
 Ortho CARE Martha [name]  Consultation  Consult requested by Dr.Gagan Mileidy Atkin is an 58 y.o. female.  HPI: Records were reviewed from the admission date.  Patient presented febrile 103 temperature with abdominal pain.  She also had generalized ache.  She has several medical problems which include renal failure  She is on dialysis  My evaluation patient complained of nausea vomiting abdominal pain.  She had had it for several days prior to admission.  She has some areas of skin irritation on the gluteal regions.  She complains of pain on the right side of her gluteus maximus area.  But most of her pain is in the pannus area and the abdomen.  Past Medical History:  Diagnosis Date   Acid reflux    takes Tums   Anemia    Arthritis    Bipolar 1 disorder (HCC)    Blindness of left eye    left eye removed   Carpal tunnel syndrome, bilateral    Cervical radiculopathy    CHF (congestive heart failure) (HCC)    Chronic kidney disease    Stage 5- 01/25/17   Constipation    COPD (chronic obstructive pulmonary disease) (HCC) 2014   bronchitis   Degenerative disc disease, thoracic    Depression    Diabetes mellitus    Type II   Diabetic retinopathy (HCC)    Dyspnea    when walking   End stage kidney disease (HCC)    M, W, F [facility]   Gout    Hypertension    Non-alcoholic cirrhosis (HCC)    Noncompliance with medication regimen    Noncompliance with medication regimen    Obesity (BMI 30-39.9)    OSA (obstructive sleep apnea)    cpap   Panic attack    RLS (restless legs syndrome)     Past Surgical History:  Procedure Laterality Date   AORTIC ARCH ANGIOGRAPHY N/A 09/11/2020   Procedure: AORTIC ARCH ANGIOGRAPHY;  Surgeon: Nada Libman, MD;  [facility]: [facility];  Service: Cardiovascular;  Laterality: N/A;   AV FISTULA PLACEMENT Right 01/27/2017   Procedure: ARTERIOVENOUS (AV) FISTULA CREATION-RIGHT ARM;  Surgeon: Sherren Kerns, MD;  Location: [facility]  OR;  Service: Vascular;  Laterality: Right;   AV FISTULA PLACEMENT Left 08/03/2018   Procedure: LEFT ARTERIOVENOUS (AV) FISTULA CREATION;  Surgeon: Sherren Kerns, MD;  Location: [facility] OR;  Service: Vascular;  Laterality: Left;   BASCILIC VEIN TRANSPOSITION Left 04/26/2019   Procedure: SECOND STAGE BASILIC VEIN TRANSPOSITION LEFT ARM;  Surgeon: Sherren Kerns, MD;  Location: [facility] OR;  Service: Vascular;  Laterality: Left;   BIOPSY N/A 05/17/2013   Procedure: BIOPSY;  Surgeon: West Bali, MD;  Location: [facility] ORS;  Service: Endoscopy;  Laterality: N/A;   CARPAL TUNNEL RELEASE Left 11/25/2018   Procedure: LEFT CARPAL TUNNEL RELEASE;  Surgeon: Dairl Ponder, MD;  Location: [facility] OR;  Service: Orthopedics;  Laterality: Left;   CATARACT EXTRACTION Right    CHOLECYSTECTOMY N/A 04/27/2014   Procedure: LAPAROSCOPIC CHOLECYSTECTOMY;  Surgeon: Marlane Hatcher, MD;  Location: [facility] ORS;  Service: General;  Laterality: N/A;   COLONOSCOPY  06/06/2010   ZOX:[id number] bleeding secondary to internal hemorrhoids but incomplete evaluation secondary to poor right colon prep/small rectal and sigmoid colon polyps (hyperplastic). PROPOFOL   COLONOSCOPY  May 2013   Dr. Gilliam/[facility]: 5 mm a descending colon polyp, hyperplastic. Adequate bowel prep.   ENDOMETRIAL ABLATION     ENUCLEATION Left 02/04/2018  ENUCLEATION WITH PLACEMENT OF IMPLANT LEFT EYE   ENUCLEATION Left 02/04/2018   Procedure: ENUCLEATION WITH PLACEMENT OF IMPLANT LEFT EYE;  Surgeon: Floydene Flock, MD;  Location: [facility] OR;  Service: Ophthalmology;  Laterality: Left;   ESOPHAGOGASTRODUODENOSCOPY  06/06/2010   WUJ:[id number] erythema and edema of body of stomach, with sessile polypoid lesions. bx benign. no h.pylori   ESOPHAGOGASTRODUODENOSCOPY (EGD) WITH PROPOFOL N/A 05/17/2013   Dr. Darrick Penna: normal esophagus, moderate nodular gastritis, negative path, empiric Savary dilation   EYE SURGERY  07/2017   sx for glaucoma   FLEXIBLE SIGMOIDOSCOPY N/A 05/17/2013    Dr. Darrick Penna: moderate sized internal hemorrhoids   HEMORRHOID BANDING N/A 05/17/2013   Procedure: HEMORRHOID BANDING;  Surgeon: West Bali, MD;  Location: [facility];  Service: Endoscopy;  Laterality: N/A;  2 bands placed   INCISION AND DRAINAGE ABSCESS Left 10/11/2013   Procedure: INCISION AND DRAINAGE AND DEBRIDEMENT LEFT BREAST  ABSCESS;  Surgeon: Marlane Hatcher, MD;  Location: [facility];  Service: General;  Laterality: Left;   INSERTION OF DIALYSIS CATHETER N/A 06/08/2018   Procedure: INSERTION OF Right internal jugular TUNNELED  DIALYSIS CATHETER;  Surgeon: Chuck Hint, MD;  Location: [facility] OR;  Service: Vascular;  Laterality: N/A;   IRRIGATION AND DEBRIDEMENT ABSCESS Right 06/01/2013   Procedure: INCISION AND DRAINAGE AND DEBRIDEMENT ABSCESS RIGHT BREAST;  Surgeon: Marlane Hatcher, MD;  Location: [facility];  Service: General;  Laterality: Right;   LEFT EYE REMOVED Left 01/2018   [facility].   LIGATION OF ARTERIOVENOUS  FISTULA Right 06/08/2018   Procedure: LIGATION OF ARTERIOVENOUS  FISTULA RIGHT ARM;  Surgeon: Chuck Hint, MD;  Location: [facility] OR;  Service: Vascular;  Laterality: Right;   ORIF ANKLE FRACTURE Right 12/17/2018   Procedure: OPEN REDUCTION INTERNAL FIXATION (ORIF) ANKLE FRACTURE;  Surgeon: Felecia Shelling, DPM;  Location: [facility] OR;  Service: Podiatry;  Laterality: Right;   REVISON OF ARTERIOVENOUS FISTULA Left 09/26/2020   Procedure: LEFT ARTERIOVENOUS FISTULA  DISTAL REVASCULARIZATION AND LIGATION ( DRIL ) USING GREATER SAPHENOUS VEIN;  Surgeon: Maeola Harman, MD;  Location: [facility] OR;  Service: Vascular;  Laterality: Left;   SAVORY DILATION N/A 05/17/2013   Procedure: SAVORY DILATION;  Surgeon: West Bali, MD;  Location: [facility];  Service: Endoscopy;  Laterality: N/A;  14/15/16   SKIN FULL THICKNESS GRAFT Left 05/05/2018   Procedure: ABDOMINAL DERMIS FAT SKIN GRAFT FULL THICKNESS LEFT EYE;  Surgeon: Floydene Flock, MD;   Location: [facility] OR;  Service: Ophthalmology;  Laterality: Left;   TUBAL LIGATION     VEIN HARVEST Left 09/26/2020   Procedure: LEFT LEG GREATER SAPHENOUS VEIN HARVEST;  Surgeon: Maeola Harman, MD;  Location: [facility] OR;  Service: Vascular;  Laterality: Left;    Family History  Adopted: Yes  Family history unknown: Yes    Social History:  reports that she has been smoking cigarettes and e-cigarettes. She started smoking about 41 years ago. She has a 37.5 pack-year smoking history. She has never used smokeless tobacco. She reports that she does not drink alcohol and does not use drugs.  Allergies:  Allergies  Allergen Reactions   Codeine Nausea And Vomiting   Tape Other (See Comments) and Rash    Pull skin off.  Paper tape is ok    Medications: I have reviewed the patient's current medications.  Results for orders placed or performed during the hospital encounter of 11/03/23 (from the past 48 hours)  Lactic acid, plasma  Status: None   Collection Time: 11/04/23  2:46 AM  Result Value Ref Range   Lactic Acid, Venous 1.5 0.5 - 1.9 mmol/L    Comment: Performed at [facility], [address] [place], [place] [address]  Comprehensive metabolic panel     Status: Abnormal   Collection Time: 11/04/23  2:46 AM  Result Value Ref Range   Sodium 139 135 - 145 mmol/L   Potassium 4.7 3.5 - 5.1 mmol/L   Chloride 102 98 - 111 mmol/L   CO2 21 (L) 22 - 32 mmol/L   Glucose, Bld 148 (H) 70 - 99 mg/dL    Comment: Glucose reference range applies only to samples taken after fasting for at least 8 hours.   BUN 46 (H) 6 - 20 mg/dL   Creatinine, Ser 60.45 (H) 0.44 - 1.00 mg/dL   Calcium 9.1 8.9 - 40.9 mg/dL   Total Protein 6.4 (L) 6.5 - 8.1 g/dL   Albumin 3.0 (L) 3.5 - 5.0 g/dL   AST 9 (L) 15 - 41 U/L   ALT 8 0 - 44 U/L   Alkaline Phosphatase 89 38 - 126 U/L   Total Bilirubin 1.8 (H) 0.0 - 1.2 mg/dL   GFR, Estimated 3 (L) >60 mL/min    Comment: (NOTE) Calculated using the CKD-EPI  Creatinine Equation (2021)    Anion gap 16 (H) 5 - 15    Comment: Performed at [facility], [address], [place], [place] [address]  CBC     Status: Abnormal   Collection Time: 11/04/23  2:46 AM  Result Value Ref Range   WBC 5.8 4.0 - 10.5 K/uL   RBC 2.95 (L) 3.87 - 5.11 MIL/uL   Hemoglobin 8.3 (L) 12.0 - 15.0 g/dL   HCT 47.8 (L) 29.5 - 62.1 %   MCV 94.6 80.0 - 100.0 fL   MCH 28.1 26.0 - 34.0 pg   MCHC 29.7 (L) 30.0 - 36.0 g/dL   RDW 30.8 (H) 65.7 - 84.6 %   Platelets 67 (L) 150 - 400 K/uL    Comment: SPECIMEN CHECKED FOR CLOTS Immature Platelet Fraction may be clinically indicated, consider ordering this additional test [id number] REPEATED TO VERIFY PLATELETS APPEAR DECREASED    nRBC 0.0 0.0 - 0.2 %    Comment: Performed at [facility], [address], [place], [place] [address]  Magnesium     Status: Abnormal   Collection Time: 11/04/23  2:46 AM  Result Value Ref Range   Magnesium 1.6 (L) 1.7 - 2.4 mg/dL    Comment: Performed at [facility], [address], [place], [place] [address]  Phosphorus     Status: None   Collection Time: 11/04/23  2:46 AM  Result Value Ref Range   Phosphorus 4.1 2.5 - 4.6 mg/dL    Comment: Performed at [facility], [address] [place], [place] [address]  Lactic acid, plasma     Status: None   Collection Time: 11/04/23  5:49 AM  Result Value Ref Range   Lactic Acid, Venous 1.6 0.5 - 1.9 mmol/L    Comment: Performed at [facility], [address], [place], [place] [address]  Hepatitis B surface antigen     Status: None   Collection Time: 11/04/23  5:49 AM  Result Value Ref Range   Hepatitis B Surface Ag NON REACTIVE NON REACTIVE    Comment: Performed at [facility], [address], [place], [place] [address]  Hepatitis B surface antibody,quantitative  Status: Abnormal   Collection Time: 11/04/23  5:49 AM  Result Value Ref Range   Hep B S AB Quant (Post) <3.5 (L) Immunity>10 mIU/mL    Comment: (NOTE)  Status of Immunity                      Anti-HBs Level  ------------------                     -------------- Inconsistent with Immunity                  0.0 - 10.0 Consistent with Immunity                         >10.0 Performed At: [facility] [address] [place], [place] [id number] Jolene Schimke MD MV:[id number]   MRSA Next Gen by PCR, Nasal     Status: Abnormal   Collection Time: 11/05/23 12:00 PM   Specimen: Nasal Mucosa; Nasal Swab  Result Value Ref Range   MRSA by PCR Next Gen DETECTED (A) NOT DETECTED    Comment: RESULT CALLED TO, READ BACK BY AND VERIFIED WITH: D HAIRSTON AT 1356 11/05/23 BY A WILSON (NOTE) The GeneXpert MRSA Assay (FDA approved for NASAL specimens only), is one component of a comprehensive MRSA colonization surveillance program. It is not intended to diagnose MRSA infection nor to guide or monitor treatment for MRSA infections. Test performance is not FDA approved in patients less than 61 years old. Performed at [facility], [address], [place], [place] [address]     No results found.  Review of Systems fever generalized body ache Blood pressure 112/77, pulse 100, temperature 99.6 F (37.6 C), temperature source Oral, resp. rate 18, height 5\' 8"  (1.727 m), weight 118.4 kg, last menstrual period 08/03/2008, SpO2 90%. Physical Exam Constitutional:      General: She is not in acute distress.    Appearance: She is obese. She is not ill-appearing, toxic-appearing or diaphoretic.  HENT:     Head: Normocephalic and atraumatic.     Mouth/Throat:     Pharynx: Oropharynx is clear. No oropharyngeal exudate.  Eyes:     General: No scleral icterus.       Right eye: No discharge.     Extraocular Movements: Extraocular movements intact.     Conjunctiva/sclera: Conjunctivae normal.     Pupils: Pupils are equal, round, and reactive to light.     Comments: Deformity of the left orbit  Right eye extraocular muscles intact pupils equal accommodating normally   Cardiovascular:     Rate and Rhythm: Normal rate.     Pulses: Normal pulses.  Pulmonary:     Effort: Pulmonary effort is normal.  Abdominal:     General: There is no distension.     Palpations: Abdomen is soft. There is no mass.     Tenderness: There is abdominal tenderness. There is no rebound.     Comments: Firm areas in the pannus which were tender when the pannus was lifted up the lower abdomen was also tender.  There was some tenderness at the symphysis pubis  Musculoskeletal:     Cervical back: No rigidity or tenderness.  Skin:    General: Skin is warm and dry.     Capillary Refill: Capillary refill takes less than 2 seconds.     Comments: Skin over the abdomen was normal and lower extremities normal upper extremities normal.  This  did not include examination of the buttocks  Neurological:     Mental Status: She is alert and oriented to person, place, and time.     Sensory: No sensory deficit.     Coordination: Coordination normal.  Psychiatric:        Mood and Affect: Mood normal.        Behavior: Behavior normal.        Thought Content: Thought content normal.        Judgment: Judgment normal.     Assessment/Plan: 4 recent CT scans were reviewed going back to 2024   The most recent CT scan from 2024 did show mild changes at the symphysis pubis  The next 3 CT scans the last 2 being very stable before this one shows erosion on the left side of the symphysis pubis but there is no mass or inflammation  11/03/23 Musculoskeletal: Erosive changes at the pubic symphysis left greater than right stable compared to recent priors, progressive compared to 2024. Large Schmorl's node at L1.   IMPRESSION: 1. Negative for acute intrathoracic or intra-abdominal/pelvic abnormality. 2. Marked splenomegaly.  Atrophic kidneys. 3. Diffuse generalized subcutaneous edema. 4. Erosive changes at the pubic symphysis left greater than right stable compared to recent priors, progressive  compared to 2024. Findings could be secondary to inflammatory pubis osteitis or infection. 5. Aortic atherosclerosis.   Electronically Signed: By: Jasmine Pang M.D. On: 11/03/2023 21:33    10/19/23 Musculoskeletal: Osteopenia with degenerative changes and findings of renal osteodystrophy. Fragmentation of the pubic symphysis as seen previously may represent osteomyelitis. Clinical correlation is recommended. No acute osseous pathology.    09/21/23 Musculoskeletal: Mild subcutaneous stranding along the anterior abdominal wall, favors medication injection sites, although left inferior cutaneous thickening could reflect superimposed cellulitis (series 2/image 86).   01/06/2023 Musculoskeletal: Degenerative changes in the spine. Schmorl's nodes. Schmorl's nodes are progressing since prior study. Possible erosion versus degenerative change in the inferior endplate of T10. This is progressing since previous study and could indicate discitis. Consider MRI correlation.   IMPRESSION: 1. Infiltration and skin thickening in the anterior abdominal wall with focal skin lesion and underlying soft tissue gas. Changes likely represent cellulitis. No loculated collections are identified. 2. Possible erosion versus progressive degenerative change in the inferior endplate of T10. This could indicate discitis in the appropriate clinical setting. Consider MRI for further characterization. 3. Multiple Schmorl's nodes progressing since prior study. 4. Aortic atherosclerosis. 5. Prominent splenic enlargement is similar to prior study. Consider infectious or inflammatory etiologies versus neoplasm/lymphoma. 6. Bilateral atrophic kidneys.     Electronically Signed   By: Burman Nieves M.D.   On: 01/06/2023 16:23 The key features of her CT scans over the last year are that the changes over the last 2 CT scans prior to this 1 when compared to the most recent one shows no change no fluid no soft  tissue mass.  Doubtful that this is the source of the patient's pain or fever.  Most of her discomfort seems to be abdominal in nature.  No other orthopedic indications for intervention at this time.  In the event that the symphysis pubis area was infected, this would be a chronic situation amenable to IV antibiotics.  Biopsying this area would be extremely difficult.  At this time I do not feel MRI is necessary.  Fuller Canada 11/05/2023, 9:34 PM

## 2023-11-05 NOTE — Plan of Care (Signed)
 Pt is alert and oriented x 3. Confused to time. Pt has poor understanding of safety and the need for staff to complete treatments and care. Pt required xanax at bedtime due to agitation. Refuse to wear cpap during overnight.  Problem: Education: Goal: Knowledge of General Education information will improve Description: Including pain rating scale, medication(s)/side effects and non-pharmacologic comfort measures Outcome: Progressing   Problem: Health Behavior/Discharge Planning: Goal: Ability to manage health-related needs will improve Outcome: Progressing   Problem: Clinical Measurements: Goal: Ability to maintain clinical measurements within normal limits will improve Outcome: Progressing Goal: Will remain free from infection Outcome: Progressing Goal: Diagnostic test results will improve Outcome: Progressing Goal: Respiratory complications will improve Outcome: Progressing Goal: Cardiovascular complication will be avoided Outcome: Progressing   Problem: Activity: Goal: Risk for activity intolerance will decrease Outcome: Progressing   Problem: Nutrition: Goal: Adequate nutrition will be maintained Outcome: Progressing   Problem: Coping: Goal: Level of anxiety will decrease Outcome: Progressing   Problem: Elimination: Goal: Will not experience complications related to bowel motility Outcome: Progressing Goal: Will not experience complications related to urinary retention Outcome: Progressing   Problem: Pain Managment: Goal: General experience of comfort will improve and/or be controlled Outcome: Progressing   Problem: Safety: Goal: Ability to remain free from injury will improve Outcome: Progressing   Problem: Skin Integrity: Goal: Risk for impaired skin integrity will decrease Outcome: Progressing

## 2023-11-05 NOTE — Progress Notes (Signed)
 Patient ID: Martha [name], female   DOB: Mar 10, 1966, 58 y.o.   MRN: [id number]  Chief Complaint  Patient presents with   Generalized Body Aches   I was asked to consult on this patient by Meredeth Ide, MDAttending   Reason for consultation evaluation of abnormal CT scan read as chronic inflammation of the symphysis pubis  History and physical notes are in the consult section  I did not find any evidence of symphysis pubis inflammation.  The patient has a large pannus and the areas in the pannus are tender across her abdomen she also complains of nausea and vomiting  She complains of diarrhea  I suspect her abdominal pain is coming from 1 of these areas  I am signing off at this point but and happy to reconsult if needed

## 2023-11-05 NOTE — Progress Notes (Signed)
 [facility] Progress Note  Name: Martha [name] MRN: [id number] DOB: 08-Aug-1965  Chief Complaint:  Fever and generalized aches   Subjective:  Strict ins/outs not available.  No UOP charted over 4/9.  Last HD on 4/9 with 2.4 kg UF.   Spoke with pt's nurse and pt - pt reports her eye irritation is chronic and that it bothers her overnight.  She has blanket covering her face on arrival   Review of systems:  Denies shortness of breath or chest pain  Reports nausea and some diarrhea  Her eye has been irritated and bleeding.    --------------  Background on consult:  Martha [name] is a/an 58 y.o. female with a past medical history of ESRD who presents with generalized aches.   Patient was unable to give history.  Unclear if this was because she was altered or if because she was in pain and had difficulty giving details.  She often responded I do not know.  I did discuss her care with Aram Beecham who is listed as her daughter but is actually not related and is a Development worker, international aid that takes care of the patient. The patient was recently hospitalized and discharged on 3/28.  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  She was having abdominal pain, nausea, vomiting and it was felt the symptoms were related to the splenic infarct.  Aram Beecham states that the patient has not improved since discharge.  She continues to have poor p.o. intake, nausea, vomiting, abdominal pain.  The patient also endorses generalized bodyaches.  The patient states she is unsure when she last had dialysis but Aram Beecham said it was Friday; other providers have documented last dialysis is being 3/26.  Aram Beecham states that the patient has not been going to dialysis regularly.  She often skips and is noncompliant.  Patient has described the pain in her abdomen as sharp and intermittent.  In the emergency department she was found to be febrile with a temperature of 103 Fahrenheit.  She had some hypoxia that  improved with supplemental oxygen.  Lactic acid was initially elevated and then improved.  She was negative for RSV, flu, COVID.  CT of the abdomen did demonstrate erosive changes at the pubic symphysis.   Intake/Output Summary (Last 24 hours) at 11/05/2023 0932 Last data filed at 11/05/2023 0700 Gross per 24 hour  Intake 50 ml  Output 2400 ml  Net -2350 ml    Vitals:  Vitals:   11/04/23 2306 11/04/23 2309 11/05/23 0351 11/05/23 0919  BP:  (!) 154/62 (!) 159/78 (!) 168/74  Pulse: 99 99 95 75  Resp: 18 18 (!) 21 18  Temp:  98.4 F (36.9 C) 97.8 F (36.6 C) 98.5 F (36.9 C)  TempSrc:  Oral Oral Oral  SpO2: 93% 93% 95% 100%  Weight:      Height:         Physical Exam:  General adult female in chair in no acute distress.   HEENT left eye with dried blood around orbit and question of ecchymoses.  Gross left eye visual impairment Neck supple trachea midline Lungs clear to auscultation bilaterally normal work of breathing at rest on room air Heart  S1S2 no rub Abdomen soft nontender obese habitus Extremities trace if any edema  Psych normal mood and affect Neuro gross left eye visual impairment; alert and conversant; provides some history   Medications reviewed   Labs:     Latest Ref Rng & Units  11/04/2023    2:46 AM 11/03/2023    5:14 PM 10/22/2023   12:00 AM  BMP  Glucose 70 - 99 mg/dL 213  086  578   BUN 6 - 20 mg/dL 46  42  22   Creatinine 0.44 - 1.00 mg/dL 46.96  29.52  8.41   Sodium 135 - 145 mmol/L 139  140  138   Potassium 3.5 - 5.1 mmol/L 4.7  5.1  3.3   Chloride 98 - 111 mmol/L 102  100  100   CO2 22 - 32 mmol/L 21  20  26    Calcium 8.9 - 10.3 mg/dL 9.1  9.3  8.6    Outpatient dialysis prescription:  [facility] MWF EDW 120.5kg  4hrs AVF BFR 400/DFR 500 Heparin 1000 bolus and 800/hr Venofer 50 weekly Mircera 200 q2wks Sensipar 30 qtx, calcitriol qtx   Assessment/Plan:    # ESRD: - HD per MWF schedule  - renal panel and CBC in AM   #  Volume/ hypertension: Hypertensive and volume overloaded. Significant body edema on CT. Weighed under EDW on admission but weight likely not accurate and EDW may need to be adjusted.  - noted fluids were stopped by nephrology earlier    # Anemia of Chronic Kidney Disease: - No IV iron for now given infection.  - Aranesp 60 mcg every Wednesday    # Secondary Hyperparathyroidism/Hyperphosphatemia: Ca and phos acceptable. On sensipar and calcitriol   #Fever: unclear source. On abx per primary team.  Blood cx NGTD    # Uncontrolled type 2 diabetes with hyperglycemia: mgmt per primary team    #GOC:  note patient missing dialysis routinely - often because she is in pain and generally doesn't feel well. She would benefit from palliative care evaluation   Disposition per primary team   Estanislado Emms, MD 11/05/2023 9:56 AM

## 2023-11-05 NOTE — Consult Note (Signed)
 Consultation Note Date: 11/05/2023   Patient Name: Martha [name]  DOB: 06-10-1966  MRN: [id number]  Age / Sex: 58 y.o., female  PCP: Carmel Sacramento, NP Referring Physician: Meredeth Ide, MD  Reason for Consultation: Establishing goals of care  HPI/Patient Profile: 58 y.o. female  with past medical history of ESRD on HD (MWF) COPD, hypertension, NASH, AOCD, left eye blindness, type 2 diabetes mellitus with retinopathy, RLS, CHF, morbid obesity admitted on 11/03/2023 with acute febrile illness cause unclear.   Clinical Assessment and Goals of Care: I have reviewed medical records including EPIC notes, labs and imaging, received report from RN, assessed the patient.  Mrs. Martha [name] is sitting in the Arnold chair in her room.  She appears acutely/chronically ill and frail, obese.  She has left eye blindness and will briefly make but not keep eye contact.  She is alert and oriented x 3, able to make her needs known.  There is no family at bedside at this time.  We meet at the bedside to discuss diagnosis prognosis, GOC, EOL wishes, disposition and options.  I introduced Palliative Medicine as specialized medical care for people living with serious illness. It focuses on providing relief from the symptoms and stress of a serious illness. The goal is to improve quality of life for both the patient and the family.  We discussed a brief life review of the patient.  Mrs. Martha [name] tells me that she has been married to her current husband for 5 years but they have been together for longer.  She has 1 natural child, son Martha [name].  She calls her caretaker who is also her niece by marriage, Martha [name], "my daughter".  She tells me that Martha [name] is her CAP aid providing 5 hours/day for 5 days a week care.  She tells me that she has been on hemodialysis for 8 years.  We then focused on their current illness.  We talked about her acute illness and  the treatment plan.  We talked about time for outcomes.  We talked about her missed HD sessions.  She states that she has missed because she has been sick.  I ask if she is interested in stopping dialysis and she tells me she is not.  I asked what would happen if she were to stop and she states, "I would die".  The natural disease trajectory and expectations at EOL were discussed.  Advanced directives, concepts specific to code status, artifical feeding and hydration, and rehospitalization were considered and discussed.  We talked about the concept of "treat the treatable, but allow a natural passing".  Mrs. Martha [name] tells me that she would want full scope/full code, but would not want long-term life support, no tracheotomy.  Palliative Care services outpatient were explained and offered.  We talked about the benefits of outpatient palliative services for relationship building, support.  Provider choice offered, she chooses [facility] [facility].   Discussed the importance of continued conversation with family and the medical providers regarding overall plan of care and treatment options, ensuring decisions  are within the context of the patient's values and GOCs. Questions and concerns were addressed.  The family was encouraged to call with questions or concerns.  PMT will continue to support holistically.  Conference with attending, nephrologist, HD RN, bedside nursing staff, transition of care team related to patient condition, needs, goals of care, disposition.    HCPOA  NEXT OF KIN -Mrs. Martha [name] initially states that she would want her caretaker who she calls "my daughter" Martha [name], to be her healthcare surrogate.  I shared that it is not usual for someone who is a paid caregiver to be healthcare power of attorney.  She tells me that Martha [name] is also her niece by marriage.  Mrs. Martha [name] has 1 son, Martha [name].  Her spouse of 5 years is currently in ALF.    SUMMARY OF RECOMMENDATIONS   Continue to treat the  treatable Would accept short-term life support, no tracheotomy Anticipate home with home health if needed/qualified Outpatient palliative services with [facility] Continue HD as long as possible   Code Status/Advance Care Planning: Full code -Mrs. Martha [name] tells me that she would want attempted resuscitation.  She states that she would not want tracheotomy.  Symptom Management:  Per hospitalist, no additional needs at this time.  Palliative Prophylaxis:  Frequent Pain Assessment and Oral Care  Additional Recommendations (Limitations, Scope, Preferences): Full Scope Treatment and No Tracheostomy  Psycho-social/Spiritual:  Desire for further Chaplaincy support:no Additional Recommendations: Caregiving  Support/Resources  Prognosis:  Unable to determine, based on outcomes.  Guarded at this point due to noncompliance with HD and 4 hospital admits in the last 6 months.    Discharge Planning: Home with Palliative Services      Primary Diagnoses: Present on Admission:  Acute febrile illness  Morbid obesity (HCC)  Nausea & vomiting  Abdominal pain  RESTLESS LEG SYNDROME  GERD   I have reviewed the medical record, interviewed the patient and family, and examined the patient. The following aspects are pertinent.  Past Medical History:  Diagnosis Date   Acid reflux    takes Tums   Anemia    Arthritis    Bipolar 1 disorder (HCC)    Blindness of left eye    left eye removed   Carpal tunnel syndrome, bilateral    Cervical radiculopathy    CHF (congestive heart failure) (HCC)    Chronic kidney disease    Stage 5- 01/25/17   Constipation    COPD (chronic obstructive pulmonary disease) (HCC) 2014   bronchitis   Degenerative disc disease, thoracic    Depression    Diabetes mellitus    Type II   Diabetic retinopathy (HCC)    Dyspnea    when walking   End stage kidney disease (HCC)    M, W, F [facility]   Gout    Hypertension    Non-alcoholic cirrhosis (HCC)     Noncompliance with medication regimen    Noncompliance with medication regimen    Obesity (BMI 30-39.9)    OSA (obstructive sleep apnea)    cpap   Panic attack    RLS (restless legs syndrome)    Social History   Socioeconomic History   Marital status: Married    Spouse name: Not on file   Number of children: 2   Years of education: Not on file   Highest education level: Not on file  Occupational History   Occupation: Disabled  Tobacco Use   Smoking status: Some Days    Current packs/day: 0.00  Average packs/day: 1.5 packs/day for 25.0 years (37.5 ttl pk-yrs)    Types: Cigarettes, E-cigarettes    Start date: 10/23/1982    Last attempt to quit: 10/23/2007    Years since quitting: 16.0   Smokeless tobacco: Never   Tobacco comments:    I vape once in a while.  Vaping Use   Vaping status: Never Used  Substance and Sexual Activity   Alcohol use: No   Drug use: No   Sexual activity: Yes    Birth control/protection: Surgical  Other Topics Concern   Not on file  Social History Narrative   Lives with fiance.     Social Drivers of Corporate investment banker Strain: Not on file  Food Insecurity: No Food Insecurity (11/04/2023)   Hunger Vital Sign    Worried About Running Out of Food in the Last Year: Never true    Ran Out of Food in the Last Year: Never true  Transportation Needs: No Transportation Needs (11/04/2023)   PRAPARE - Administrator, Civil Service (Medical): No    Lack of Transportation (Non-Medical): No  Physical Activity: Not on file  Stress: Not on file  Social Connections: Unknown (10/20/2023)   Social Connection and Isolation Panel [NHANES]    Frequency of Communication with Friends and Family: More than three times a week    Frequency of Social Gatherings with Friends and Family: Once a week    Attends Religious Services: Never    Database administrator or Organizations: No    Attends Engineer, structural: Never    Marital Status:  Patient declined   Family History  Adopted: Yes  Family history unknown: Yes   Scheduled Meds:  apixaban  5 mg Oral BID   atorvastatin  20 mg Oral Daily   busPIRone  7.5 mg Oral BID   calcitRIOL  2 mcg Oral Q M,W,F   Chlorhexidine Gluconate Cloth  6 each Topical Q0600   Chlorhexidine Gluconate Cloth  6 each Topical Q0600   cinacalcet  30 mg Oral Q M,W,F   darbepoetin (ARANESP) injection - DIALYSIS  60 mcg Subcutaneous Q Wed-1800   gabapentin  300 mg Oral QHS   liver oil-zinc oxide   Topical BID   nystatin   Topical TID   pantoprazole  80 mg Oral Daily   PARoxetine  20 mg Oral QHS   sucroferric oxyhydroxide  1,000 mg Oral TID WC   Continuous Infusions: PRN Meds:.acetaminophen **OR** acetaminophen, ALPRAZolam, HYDROmorphone (DILAUDID) injection, ondansetron **OR** ondansetron (ZOFRAN) IV, pentafluoroprop-tetrafluoroeth Medications Prior to Admission:  Prior to Admission medications   Medication Sig Start Date End Date Taking? Authorizing Provider  ADVAIR DISKUS 250-50 MCG/ACT AEPB Inhale 1 puff into the lungs 2 (two) times daily. 07/01/22  Yes [provider]  albuterol (PROVENTIL) (2.5 MG/3ML) 0.083% nebulizer solution Take 3 mLs (2.5 mg total) by nebulization every 12 (twelve) hours as needed for wheezing or shortness of breath. 08/05/22  Yes Valentino Nose, NP  ALPRAZolam Prudy Feeler) 1 MG tablet Take 1 tablet (1 mg total) by mouth 4 (four) times daily as needed for anxiety. Patient taking differently: Take 1 mg by mouth 4 (four) times daily. 07/12/20  Yes Rhetta Mura, MD  apixaban (ELIQUIS) 5 MG TABS tablet Take 2 tablets (10 mg total) by mouth 2 (two) times daily for 7 days, THEN 1 tablet (5 mg total) 2 (two) times daily for 23 days. 10/23/23 11/22/23 Yes Tyrone Nine, MD  Artificial Tear  Ointment (DRY EYES OP) Place 1 drop into the right eye 2 (two) times daily.   Yes [provider]  atorvastatin (LIPITOR) 20 MG tablet Take 20 mg by mouth daily. 10/21/18   Yes [provider]  atropine (ISOPTO ATROPINE) 1 % ophthalmic solution Place 1 drop into the left eye 3 (three) times daily. 12/27/19  Yes [provider]  brimonidine (ALPHAGAN) 0.2 % ophthalmic solution Place 1 drop into the left eye 3 (three) times daily. 05/24/21  Yes [provider]  busPIRone (BUSPAR) 7.5 MG tablet Take 1 tablet (7.5 mg total) by mouth 2 (two) times daily. 07/12/20  Yes Rhetta Mura, MD  cinacalcet (SENSIPAR) 60 MG tablet Take 60 mg by mouth daily.   Yes [provider]  colchicine 0.6 MG tablet TAKE 1 TABLET(0.6 MG) BY MOUTH DAILY 06/04/23  Yes Felecia Shelling, DPM  doxercalciferol (HECTOROL) 4 MCG/2ML injection Inject 0.5 mLs (1 mcg total) into the vein every Monday, Wednesday, and Friday with hemodialysis. 12/03/18  Yes Kari Baars, MD  gabapentin (NEURONTIN) 100 MG capsule Take 1 capsule (100 mg total) by mouth at bedtime. Patient taking differently: Take 300 mg by mouth at bedtime. 07/12/20  Yes Rhetta Mura, MD  HYDROcodone-acetaminophen (NORCO) 10-325 MG tablet Take 1 tablet by mouth 4 (four) times daily. 08/30/20  Yes [provider]  insulin glargine (LANTUS SOLOSTAR) 100 UNIT/ML Solostar Pen Inject 30 Units into the skin daily.   Yes [provider]  latanoprost (XALATAN) 0.005 % ophthalmic solution Place 1 drop into the left eye 2 (two) times daily. 08/22/20  Yes [provider]  omeprazole (PRILOSEC) 40 MG capsule Take 40 mg by mouth daily.   Yes [provider]  ondansetron (ZOFRAN) 4 MG tablet TAKE (1) TABLET THREE TIMES DAILY AS NEEDED FOR NAUSEA. 07/30/22  Yes [provider]  PARoxetine (PAXIL) 20 MG tablet Take 1 tablet (20 mg total) by mouth every evening. This is to prevent panic attacks Patient taking differently: Take 20 mg by mouth at bedtime. 07/12/20  Yes Rhetta Mura, MD  VELPHORO 500 MG chewable tablet Chew 1,000 mg by mouth 3 (three) times daily. 08/05/23   Yes [provider]  Blood Glucose Monitoring Suppl (FORA V30A BLOOD GLUCOSE SYSTEM) DEVI  09/22/20   [provider]  Maudie Flakes PREMIUM V10 TEST test strip  09/22/20   [provider]   Allergies  Allergen Reactions   Codeine Nausea And Vomiting   Tape Other (See Comments) and Rash    Pull skin off.  Paper tape is ok   Review of Systems  Unable to perform ROS: Acuity of condition    Physical Exam Vitals and nursing note reviewed.     Vital Signs: BP (!) 155/69 (BP Location: Right Wrist)   Pulse 77   Temp 99.8 F (37.7 C) (Oral)   Resp 20   Ht 5\' 8"  (1.727 m)   Wt 118.4 kg   LMP 08/03/2008 (Exact Date)   SpO2 99%   BMI 39.68 kg/m  Pain Scale: 0-10 POSS *See Group Information*: S-Acceptable,Sleep, easy to arouse Pain Score: 10-Worst pain ever   SpO2: SpO2: 99 % O2 Device:SpO2: 99 % O2 Flow Rate: .O2 Flow Rate (L/min): 2 L/min  IO: Intake/output summary:  Intake/Output Summary (Last 24 hours) at 11/05/2023 1312 Last data filed at 11/05/2023 0935 Gross per 24 hour  Intake 150 ml  Output 2400 ml  Net -2250 ml    LBM: Last BM Date : 11/05/23 Baseline  Weight: Weight: 118.4 kg Most recent weight: Weight: 118.4 kg     Palliative Assessment/Data:     Time In: 1300 Time Out: 1415 Time Total: 75 minutes  Greater than 50%  of this time was spent counseling and coordinating care related to the above assessment and plan.  Signed by: Katheran Awe, NP   Please contact Palliative Medicine Team phone at [phone] for questions and concerns.  For individual provider: See Loretha Stapler

## 2023-11-05 NOTE — Progress Notes (Signed)
   11/05/23 2333  BiPAP/CPAP/SIPAP  BiPAP/CPAP/SIPAP Pt Type Adult  BiPAP/CPAP/SIPAP  (patient's home machine)  Mask Type Full face mask (patient's home mask)  Dentures removed? Not applicable  EPAP  (patient's home settings)  FiO2 (%) 21 %  Patient Home Machine Yes  Safety Check Completed by RT for Home Unit Yes, no issues noted  Patient Home Mask Yes  Patient Home Tubing Yes  Minimum cmH2O 7 cmH2O  Maximum cmH2O 11 cmH2O  BiPAP/CPAP /SiPAP Vitals  Pulse Rate 74  Resp 18  SpO2 93 %  Bilateral Breath Sounds Clear;Diminished  MEWS Score/Color  MEWS Score 0  MEWS Score Color Green

## 2023-11-05 NOTE — Progress Notes (Signed)
 [facility]  PROGRESS NOTE  Martha [name] UJW:[id number] DOB: 1966/02/24 DOA: 11/03/2023 PCP: Carmel Sacramento, NP   Brief HPI:   58 y.o. female with medical history significant of  ESRD on HD (MWF) COPD, hypertension, NASH, AOCD, left eye blindness, type 2 diabetes mellitus with retinopathy, RLS, CHF who presents to the emergency department due to lower abdominal pain, nausea, NBNB vomiting.  Abdominal pain was sharp in nature, it was intermittent with no known alleviating/aggravating factors.  Last dialysis was on 10/21/2023 due to feeling unwell  BMP was normal except for bicarb 20, blood glucose 123, BUN 42, creatinine 1.47.  Anion gap 20.  Lactic acid 3.8 > 2.4.  Troponin 12 > 14, BNP 1300 (this was 1,369 on 08/18/2023).  TSH 5.193.  Influenza A, B, SARS coronavirus 2, RSV was negative. CT chest, abdomen and pelvis without contrast was negative for acute intrathoracic or intra-abdominal/pelvic abnormality.  Erosive changes at the pubic symphysis considered to be secondary to inflammatory process osteitis or infection.   Assessment/Plan:   Acute febrile illness -Tmax 103 F; resolved -Has been afebrile for past 24 hours -Unclear etiology -Started on empiric vancomycin and Zosyn -Blood cultures x 2 are negative to date - Will discontinue antibiotics  Buttock/pelvic pain - Patient complaining of pain in the buttocks - CT pelvis shows erosive changes at the pubic symphysis likely from inflammatory process versus osteitis versus infection - Will consult orthopedics for further recommendations, may need corticosteroid injection !    Nausea, vomiting and abdominal pain Continue Zofran as needed Continue Dilaudid as needed    ESRD on HD BNP 1,300(this was 1,369 on 08/18/2023) This is possibly due to fluid overload, considering patient does not have a dialysis in about 1 week Nephrologist (Dr. Arrie Aran) was consulted for maintenance dialysis in the morning per EDP -Patient has been  missing hemodialysis due to pain, nephrology recommending palliative care consult for goals of care -Will consult palliative care   Lactic acidosis Elevated anion gap Lactic acid 3.8 > 2.4, anion gap 20 This is possibly related to several missed dialysis sessions Nephrology consulted for maintenance dialysis  Hypertension -Blood pressure has been elevated -Start hydralazine as needed   Obesity class III (BMI of 40.88) Patient was counseled about the cardiovascular and metabolic risk of morbid obesity. Patient was counseled for diet control, exercise regimen and weight loss.    History of calciphylaxis Continue sensipar as well as Hectorol    Thrombocytopenia Platelets 82, continue to monitor platelet levels   OSA Continue CPAP.     Restless leg syndrome Continue gabapentin.    GERD Continue Protonix   Depression, anxiety, bipolar disorder Continue buspar and paroxetine.     Mixed hyperlipidemia Continue statin    Medications     apixaban  5 mg Oral BID   atorvastatin  20 mg Oral Daily   busPIRone  7.5 mg Oral BID   calcitRIOL  2 mcg Oral Q M,W,F   Chlorhexidine Gluconate Cloth  6 each Topical Q0600   Chlorhexidine Gluconate Cloth  6 each Topical Q0600   cinacalcet  30 mg Oral Q M,W,F   darbepoetin (ARANESP) injection - DIALYSIS  60 mcg Subcutaneous Q Wed-1800   gabapentin  300 mg Oral QHS   liver oil-zinc oxide   Topical BID   nystatin   Topical TID   pantoprazole  80 mg Oral Daily   PARoxetine  20 mg Oral QHS   sucroferric oxyhydroxide  1,000 mg Oral TID WC  Data Reviewed:   CBG:  Recent Labs  Lab 11/03/23 1645  GLUCAP 117*    SpO2: 100 % O2 Flow Rate (L/min): 2 L/min FiO2 (%): 21 %    Vitals:   11/04/23 2306 11/04/23 2309 11/05/23 0351 11/05/23 0919  BP:  (!) 154/62 (!) 159/78 (!) 168/74  Pulse: 99 99 95 75  Resp: 18 18 (!) 21 18  Temp:  98.4 F (36.9 C) 97.8 F (36.6 C) 98.5 F (36.9 C)  TempSrc:  Oral Oral Oral  SpO2: 93% 93%  95% 100%  Weight:      Height:          Data Reviewed:  Basic Metabolic Panel: Recent Labs  Lab 11/03/23 1714 11/04/23 0246  NA 140 139  K 5.1 4.7  CL 100 102  CO2 20* 21*  GLUCOSE 123* 148*  BUN 42* 46*  CREATININE 11.47* 11.86*  CALCIUM 9.3 9.1  MG  --  1.6*  PHOS  --  4.1    CBC: Recent Labs  Lab 11/03/23 1714 11/04/23 0246  WBC 5.9 5.8  NEUTROABS 4.9  --   HGB 9.3* 8.3*  HCT 31.7* 27.9*  MCV 94.9 94.6  PLT 82* 67*    LFT Recent Labs  Lab 11/03/23 1714 11/04/23 0246  AST 15 9*  ALT 7 8  ALKPHOS 104 89  BILITOT 1.6* 1.8*  PROT 7.1 6.4*  ALBUMIN 3.5 3.0*     Antibiotics: Anti-infectives (From admission, onward)    Start     Dose/Rate Route Frequency Ordered Stop   11/05/23 1130  piperacillin-tazobactam (ZOSYN) 2.25 g in sodium chloride 0.9 % 50 mL IVPB        2.25 g 100 mL/hr over 30 Minutes Intravenous Every 8 hours 11/05/23 1054     11/04/23 1200  vancomycin (VANCOCIN) IVPB 1000 mg/200 mL premix        1,000 mg 200 mL/hr over 60 Minutes Intravenous Every M-W-F (Hemodialysis) 11/04/23 0250     11/04/23 0600  piperacillin-tazobactam (ZOSYN) IVPB 2.25 g  Status:  Discontinued        2.25 g 100 mL/hr over 30 Minutes Intravenous Every 8 hours 11/04/23 0250 11/05/23 1054   11/03/23 2315  vancomycin (VANCOREADY) IVPB 2000 mg/400 mL        2,000 mg 200 mL/hr over 120 Minutes Intravenous  Once 11/03/23 2300 11/04/23 0229   11/03/23 2315  piperacillin-tazobactam (ZOSYN) IVPB 3.375 g        3.375 g 100 mL/hr over 30 Minutes Intravenous  Once 11/03/23 2300 11/04/23 0024        DVT prophylaxis: Eliquis  Code Status: Full code  Family Communication: Discussed with patient's daughter at bedside   CONSULTS nephrology   Subjective   Patient complains of pain in the buttocks.   Objective    Physical Examination:   General-appears in no acute distress Heart-S1-S2, regular, no murmur auscultated Lungs-clear to auscultation bilaterally,  no wheezing or crackles auscultated Abdomen-soft, nontender, no organomegaly Buttocks-erythema noted in the buttocks Extremities-no edema in the lower extremities Neuro-alert, oriented x3, no focal deficit noted   Status is: Inpatient:             Meredeth Ide   [facility] If 7PM-7AM, please contact night-coverage at [email or web address], Office  [phone]   11/05/2023, 11:17 AM  LOS: 2 days

## 2023-11-05 NOTE — Progress Notes (Signed)
 On call MD contacted regarding patient med list states Insulin at home and checks blood sugar. NO blood sugars ordered and no insulin ordered. MD advised that hgA1c 5.2. NO orders received.

## 2023-11-06 DIAGNOSIS — N186 End stage renal disease: Secondary | ICD-10-CM | POA: Diagnosis not present

## 2023-11-06 DIAGNOSIS — K219 Gastro-esophageal reflux disease without esophagitis: Secondary | ICD-10-CM | POA: Diagnosis not present

## 2023-11-06 DIAGNOSIS — R509 Fever, unspecified: Secondary | ICD-10-CM | POA: Diagnosis not present

## 2023-11-06 DIAGNOSIS — E872 Acidosis, unspecified: Secondary | ICD-10-CM | POA: Diagnosis not present

## 2023-11-06 LAB — CBC
HCT: 35.1 % — ABNORMAL LOW (ref 36.0–46.0)
Hemoglobin: 10.3 g/dL — ABNORMAL LOW (ref 12.0–15.0)
MCH: 27.5 pg (ref 26.0–34.0)
MCHC: 29.3 g/dL — ABNORMAL LOW (ref 30.0–36.0)
MCV: 93.9 fL (ref 80.0–100.0)
Platelets: 70 10*3/uL — ABNORMAL LOW (ref 150–400)
RBC: 3.74 MIL/uL — ABNORMAL LOW (ref 3.87–5.11)
RDW: 18.1 % — ABNORMAL HIGH (ref 11.5–15.5)
WBC: 9.4 10*3/uL (ref 4.0–10.5)
nRBC: 0 % (ref 0.0–0.2)

## 2023-11-06 LAB — RENAL FUNCTION PANEL
Albumin: 2.5 g/dL — ABNORMAL LOW (ref 3.5–5.0)
Anion gap: 14 (ref 5–15)
BUN: 45 mg/dL — ABNORMAL HIGH (ref 6–20)
CO2: 22 mmol/L (ref 22–32)
Calcium: 8.8 mg/dL — ABNORMAL LOW (ref 8.9–10.3)
Chloride: 99 mmol/L (ref 98–111)
Creatinine, Ser: 8.25 mg/dL — ABNORMAL HIGH (ref 0.44–1.00)
GFR, Estimated: 5 mL/min — ABNORMAL LOW (ref >60)
Glucose, Bld: 131 mg/dL — ABNORMAL HIGH (ref 70–99)
Phosphorus: 3.9 mg/dL (ref 2.5–4.6)
Potassium: 3.9 mmol/L (ref 3.5–5.1)
Sodium: 135 mmol/L (ref 135–145)

## 2023-11-06 MED ORDER — LIDOCAINE HCL (PF) 1 % IJ SOLN: 5.0000 mL | INTRAMUSCULAR | Status: DC | PRN

## 2023-11-06 MED ORDER — LOPERAMIDE HCL 2 MG PO CAPS
2.0000 mg | ORAL_CAPSULE | ORAL | Status: DC | PRN
  Administered 2023-11-06 – 2023-11-07 (×3): 2 mg via ORAL
  Filled 2023-11-06 (×3): qty 1

## 2023-11-06 MED ORDER — LIDOCAINE-PRILOCAINE 2.5-2.5 % EX CREA: 1.0000 | TOPICAL_CREAM | CUTANEOUS | Status: DC | PRN

## 2023-11-06 NOTE — Plan of Care (Signed)
   Problem: Education: Goal: Knowledge of General Education information will improve Description Including pain rating scale, medication(s)/side effects and non-pharmacologic comfort measures Outcome: Progressing

## 2023-11-06 NOTE — Plan of Care (Signed)
   Problem: Health Behavior/Discharge Planning: Goal: Ability to manage health-related needs will improve Outcome: Progressing   Problem: Clinical Measurements: Goal: Ability to maintain clinical measurements within normal limits will improve Outcome: Progressing Goal: Will remain free from infection Outcome: Progressing

## 2023-11-06 NOTE — Progress Notes (Addendum)
 [facility]  PROGRESS NOTE  Martha [name] ZOX:[id number] DOB: 1965/12/25 DOA: 11/03/2023 PCP: Carmel Sacramento, NP   Brief HPI:   58 y.o. female with medical history significant of  ESRD on HD (MWF) COPD, hypertension, NASH, AOCD, left eye blindness, type 2 diabetes mellitus with retinopathy, RLS, CHF who presents to the emergency department due to lower abdominal pain, nausea, NBNB vomiting.  Abdominal pain was sharp in nature, it was intermittent with no known alleviating/aggravating factors.  Last dialysis was on 10/21/2023 due to feeling unwell  BMP was normal except for bicarb 20, blood glucose 123, BUN 42, creatinine 1.47.  Anion gap 20.  Lactic acid 3.8 > 2.4.  Troponin 12 > 14, BNP 1300 (this was 1,369 on 08/18/2023).  TSH 5.193.  Influenza A, B, SARS coronavirus 2, RSV was negative. CT chest, abdomen and pelvis without contrast was negative for acute intrathoracic or intra-abdominal/pelvic abnormality.  Erosive changes at the pubic symphysis considered to be secondary to inflammatory process osteitis or infection.   Assessment/Plan:   Acute febrile illness -Tmax 103 F; resolved -Has been afebrile for past 48 hours -Unclear etiology -Started on empiric vancomycin and Zosyn; discontinued on 11/05/2023 -Blood cultures x 2 are negative to date   Buttock/pelvic pain - Patient complaining of pain in the buttocks - CT pelvis shows erosive changes at the pubic symphysis likely from inflammatory process versus osteitis versus infection -Consulted orthopedics, no further recommendations per orthopedics.     Nausea, vomiting and abdominal pain Continue Zofran as needed Continue Dilaudid as needed    ESRD on HD BNP 1,300(this was 1,369 on 08/18/2023) This is possibly due to fluid overload, considering patient does not have a dialysis in about 1 week Nephrologist (Dr. Arrie Aran) was consulted for maintenance dialysis in the morning per EDP -Patient has been missing hemodialysis due to  pain, nephrology recommending palliative care consult for goals of care - Perative care consulted, continue dialysis as outpatient   Lactic acidosis Elevated anion gap Lactic acid 3.8 > 2.4, anion gap 20 This is possibly related to several missed dialysis sessions Nephrology consulted for maintenance dialysis  Hypertension -Blood pressure has been elevated -Start hydralazine as needed   Obesity class III (BMI of 40.88) Patient was counseled about the cardiovascular and metabolic risk of morbid obesity. Patient was counseled for diet control, exercise regimen and weight loss.    History of calciphylaxis Continue sensipar as well as Hectorol    Thrombocytopenia Platelets 82, continue to monitor platelet levels   OSA Continue CPAP.     Restless leg syndrome Continue gabapentin.    GERD Continue Protonix   Depression, anxiety, bipolar disorder Continue buspar and paroxetine.     Mixed hyperlipidemia Continue statin    Medications     apixaban  5 mg Oral BID   atorvastatin  20 mg Oral Daily   busPIRone  7.5 mg Oral BID   calcitRIOL  2 mcg Oral Q M,W,F   Chlorhexidine Gluconate Cloth  6 each Topical Q0600   Chlorhexidine Gluconate Cloth  6 each Topical Q0600   cinacalcet  30 mg Oral Q M,W,F   darbepoetin (ARANESP) injection - DIALYSIS  60 mcg Subcutaneous Q Wed-1800   gabapentin  300 mg Oral QHS   liver oil-zinc oxide   Topical BID   mupirocin ointment  1 Application Nasal BID   nystatin   Topical TID   pantoprazole  80 mg Oral Daily   PARoxetine  20 mg Oral QHS   sucroferric oxyhydroxide  1,000 mg Oral TID WC     Data Reviewed:   CBG:  Recent Labs  Lab 11/03/23 1645  GLUCAP 117*    SpO2: 90 % O2 Flow Rate (L/min): 2 L/min FiO2 (%): 21 %    Vitals:   11/05/23 1245 11/05/23 2050 11/05/23 2333 11/06/23 0355  BP: (!) 155/69 112/77  (!) 130/55  Pulse: 77 100 74 79  Resp: 20 18 18 18   Temp: 99.8 F (37.7 C) 99.6 F (37.6 C)  97.8 F (36.6 C)   TempSrc: Oral Oral  Oral  SpO2: 99% 90% 93% 90%  Weight:      Height:          Data Reviewed:  Basic Metabolic Panel: Recent Labs  Lab 11/03/23 1714 11/04/23 0246 11/06/23 0520  NA 140 139 135  K 5.1 4.7 3.9  CL 100 102 99  CO2 20* 21* 22  GLUCOSE 123* 148* 131*  BUN 42* 46* 45*  CREATININE 11.47* 11.86* 8.25*  CALCIUM 9.3 9.1 8.8*  MG  --  1.6*  --   PHOS  --  4.1 3.9    CBC: Recent Labs  Lab 11/03/23 1714 11/04/23 0246 11/06/23 0634  WBC 5.9 5.8 9.4  NEUTROABS 4.9  --   --   HGB 9.3* 8.3* 10.3*  HCT 31.7* 27.9* 35.1*  MCV 94.9 94.6 93.9  PLT 82* 67* 70*    LFT Recent Labs  Lab 11/03/23 1714 11/04/23 0246 11/06/23 0520  AST 15 9*  --   ALT 7 8  --   ALKPHOS 104 89  --   BILITOT 1.6* 1.8*  --   PROT 7.1 6.4*  --   ALBUMIN 3.5 3.0* 2.5*     Antibiotics: Anti-infectives (From admission, onward)    Start     Dose/Rate Route Frequency Ordered Stop   11/05/23 1130  piperacillin-tazobactam (ZOSYN) 2.25 g in sodium chloride 0.9 % 50 mL IVPB  Status:  Discontinued        2.25 g 100 mL/hr over 30 Minutes Intravenous Every 8 hours 11/05/23 1054 11/05/23 1127   11/04/23 1200  vancomycin (VANCOCIN) IVPB 1000 mg/200 mL premix  Status:  Discontinued        1,000 mg 200 mL/hr over 60 Minutes Intravenous Every M-W-F (Hemodialysis) 11/04/23 0250 11/05/23 1127   11/04/23 0600  piperacillin-tazobactam (ZOSYN) IVPB 2.25 g  Status:  Discontinued        2.25 g 100 mL/hr over 30 Minutes Intravenous Every 8 hours 11/04/23 0250 11/05/23 1054   11/03/23 2315  vancomycin (VANCOREADY) IVPB 2000 mg/400 mL        2,000 mg 200 mL/hr over 120 Minutes Intravenous  Once 11/03/23 2300 11/04/23 0229   11/03/23 2315  piperacillin-tazobactam (ZOSYN) IVPB 3.375 g        3.375 g 100 mL/hr over 30 Minutes Intravenous  Once 11/03/23 2300 11/04/23 0024        DVT prophylaxis: Eliquis  Code Status: Full code  Family Communication: Discussed with patient's daughter at  bedside   CONSULTS nephrology   Subjective   Buttock pain has improved   Objective    Physical Examination:   Appears in no acute distress S1-S2, regular Lungs clear to auscultation bilaterally Abdomen is soft, nontender, no organomegaly   Status is: Inpatient:             Meredeth Ide   [facility] If 7PM-7AM, please contact night-coverage at [email or web address], Office  [phone]   11/06/2023, 8:39 AM  LOS: 3 days

## 2023-11-06 NOTE — Progress Notes (Signed)
 Patient not to be seen physically today. Discussed with primary service, chart reviewed. Plan for HD per her MWF schedule. Appreciate palliative care's assistance. Will be seen again on Monday if she is still here. Please contact covering nephrologist over the weekend with any questions/concerns.  Anthony Sar, MD [facility]

## 2023-11-06 NOTE — Progress Notes (Signed)
   HEMODIALYSIS TREATMENT NOTE:  Uneventful 3.5 hour heparin-free treatment completed using left upper arm AVF (15g/antegrade). Goal met: 2 liters removed.  All blood was returned.  Hemostasis was achieved in 15 minutes.  Post-HD:  11/06/23 1445  Vital Signs  Temp 98.4 F (36.9 C)  Temp Source Oral  Pulse Rate 70  Pulse Rate Source Monitor  Resp 18  BP (!) 135/58  BP Location Right Wrist  BP Method Automatic  Patient Position (if appropriate) Lying  Oxygen Therapy  SpO2 98 %  O2 Device Room Air  Pain Assessment  Pain Score 3  Pain Location Abdomen  Pain Descriptors / Indicators Pressure  Dialysis Weight  Weight 119.8 kg  Type of Weight Post-Dialysis  Post Treatment  Dialyzer Clearance Lightly streaked  Hemodialysis Intake (mL) 0 mL  Liters Processed 84  Fluid Removed (mL) 2000 mL  Tolerated HD Treatment Yes  Post-Hemodialysis Comments Goal met  AVG/AVF Arterial Site Held (minutes) 7 minutes  AVG/AVF Venous Site Held (minutes) 7 minutes    Arman Filter, RN [facility]

## 2023-11-06 NOTE — Progress Notes (Signed)
   11/06/23 2257  BiPAP/CPAP/SIPAP  BiPAP/CPAP/SIPAP Pt Type Adult  Mask Type Full face mask  Dentures removed? Not applicable  Mask Size  (patient's home mask)  FiO2 (%) 21 %  Patient Home Machine Yes  Safety Check Completed by RT for Home Unit Yes, no issues noted  Patient Home Mask Yes  Patient Home Tubing Yes  Device Plugged into RED Power Outlet Yes  BiPAP/CPAP /SiPAP Vitals  Pulse Rate (!) 56  Resp 18  SpO2 95 %  Bilateral Breath Sounds Diminished  MEWS Score/Color  MEWS Score 0  MEWS Score Color Martha [name]

## 2023-11-07 ENCOUNTER — Inpatient Hospital Stay (HOSPITAL_COMMUNITY)

## 2023-11-07 DIAGNOSIS — L0231 Cutaneous abscess of buttock: Secondary | ICD-10-CM

## 2023-11-07 DIAGNOSIS — L03317 Cellulitis of buttock: Secondary | ICD-10-CM

## 2023-11-07 DIAGNOSIS — R1030 Lower abdominal pain, unspecified: Secondary | ICD-10-CM | POA: Diagnosis not present

## 2023-11-07 DIAGNOSIS — R509 Fever, unspecified: Secondary | ICD-10-CM | POA: Diagnosis not present

## 2023-11-07 DIAGNOSIS — K219 Gastro-esophageal reflux disease without esophagitis: Secondary | ICD-10-CM | POA: Diagnosis not present

## 2023-11-07 MED ORDER — BACITRACIN-POLYMYXIN B 500-10000 UNIT/GM OP OINT
TOPICAL_OINTMENT | Freq: Two times a day (BID) | OPHTHALMIC | Status: DC
  Administered 2023-11-07 – 2023-11-13 (×3): 1 via OPHTHALMIC
  Filled 2023-11-07: qty 3.5

## 2023-11-07 MED ORDER — OXYCODONE HCL 5 MG PO TABS
5.0000 mg | ORAL_TABLET | ORAL | Status: DC | PRN
  Administered 2023-11-07 – 2023-11-08 (×5): 5 mg via ORAL
  Filled 2023-11-07 (×6): qty 1

## 2023-11-07 NOTE — Progress Notes (Signed)
 [facility]  Read pending, but I'm not seeing any fluid collection in the right buttock /perianal region where the area of pressure sore corresponds.  Will await for official read.  Deena Farrier MD

## 2023-11-07 NOTE — Consult Note (Signed)
 [facility] Consult  Reason for Consult: Right buttock induration? abscess Referring Physician: Dr. Alfonse Angle  Chief Complaint   Generalized Body Aches     HPI: Martha [name] is a 58 y.o. female with ESRD on dialysis COPD, HTN, NASH, left eye blindness, DM, CHF who was admitted with abdominal pain and fever of unknown etiology. Was on antibiotics that were discontinued 4/10. CT c/a/p were negative for any etiology on 4/8.  Noted today to have concern for induration abscess on the right buttock. Patient in the chair and reported no prior abscesses.  Patient is a little sleepy but is able to stand but received pain medication recently, so is not answering many questions. She is on Eliquis. No leukocytosis.   Past Medical History:  Diagnosis Date   Acid reflux    takes Tums   Anemia    Arthritis    Bipolar 1 disorder (HCC)    Blindness of left eye    left eye removed   Carpal tunnel syndrome, bilateral    Cervical radiculopathy    CHF (congestive heart failure) (HCC)    Chronic kidney disease    Stage 5- 01/25/17   Constipation    COPD (chronic obstructive pulmonary disease) (HCC) 2014   bronchitis   Degenerative disc disease, thoracic    Depression    Diabetes mellitus    Type II   Diabetic retinopathy (HCC)    Dyspnea    when walking   End stage kidney disease (HCC)    M, W, F [facility]   Gout    Hypertension    Non-alcoholic cirrhosis (HCC)    Noncompliance with medication regimen    Noncompliance with medication regimen    Obesity (BMI 30-39.9)    OSA (obstructive sleep apnea)    cpap   Panic attack    RLS (restless legs syndrome)     Past Surgical History:  Procedure Laterality Date   AORTIC ARCH ANGIOGRAPHY N/A 09/11/2020   Procedure: AORTIC ARCH ANGIOGRAPHY;  Surgeon: Margherita Shell, MD;  [facility]: [facility];  Service: Cardiovascular;  Laterality: N/A;   AV FISTULA PLACEMENT Right 01/27/2017   Procedure: ARTERIOVENOUS (AV)  FISTULA CREATION-RIGHT ARM;  Surgeon: Richrd Char, MD;  Location[facility] OR;  Service: Vascular;  Laterality: Right;   AV FISTULA PLACEMENT Left 08/03/2018   Procedure: LEFT ARTERIOVENOUS (AV) FISTULA CREATION;  Surgeon: Richrd Char, MD;  Location: [facility] OR;  Service: Vascular;  Laterality: Left;   BASCILIC VEIN TRANSPOSITION Left 04/26/2019   Procedure: SECOND STAGE BASILIC VEIN TRANSPOSITION LEFT ARM;  Surgeon: Richrd Char, MD;  Location: [facility] OR;  Service: Vascular;  Laterality: Left;   BIOPSY N/A 05/17/2013   Procedure: BIOPSY;  Surgeon: Alyce Jubilee, MD;  Location: [facility] ORS;  Service: Endoscopy;  Laterality: N/A;   CARPAL TUNNEL RELEASE Left 11/25/2018   Procedure: LEFT CARPAL TUNNEL RELEASE;  Surgeon: Florida Hurter, MD;  Location: [facility] OR;  Service: Orthopedics;  Laterality: Left;   CATARACT EXTRACTION Right    CHOLECYSTECTOMY N/A 04/27/2014   Procedure: LAPAROSCOPIC CHOLECYSTECTOMY;  Surgeon: Myrl Askew, MD;  Location: [facility] ORS;  Service: General;  Laterality: N/A;   COLONOSCOPY  06/06/2010   NWG:[id number] bleeding secondary to internal hemorrhoids but incomplete evaluation secondary to poor right colon prep/small rectal and sigmoid colon polyps (hyperplastic). PROPOFOL   COLONOSCOPY  May 2013   Dr. Gilliam/[facility]: 5 mm a descending colon polyp, hyperplastic. Adequate bowel prep.   ENDOMETRIAL ABLATION  ENUCLEATION Left 02/04/2018   ENUCLEATION WITH PLACEMENT OF IMPLANT LEFT EYE   ENUCLEATION Left 02/04/2018   Procedure: ENUCLEATION WITH PLACEMENT OF IMPLANT LEFT EYE;  Surgeon: Karan Osgood, MD;  Location: [facility] OR;  Service: Ophthalmology;  Laterality: Left;   ESOPHAGOGASTRODUODENOSCOPY  06/06/2010   OZH:[id number] erythema and edema of body of stomach, with sessile polypoid lesions. bx benign. no h.pylori   ESOPHAGOGASTRODUODENOSCOPY (EGD) WITH PROPOFOL N/A 05/17/2013   Dr. Nolene Baumgarten: normal esophagus, moderate nodular gastritis, negative path, empiric Savary dilation   EYE  SURGERY  07/2017   sx for glaucoma   FLEXIBLE SIGMOIDOSCOPY N/A 05/17/2013   Dr. Nolene Baumgarten: moderate sized internal hemorrhoids   HEMORRHOID BANDING N/A 05/17/2013   Procedure: HEMORRHOID BANDING;  Surgeon: Alyce Jubilee, MD;  Location: [facility];  Service: Endoscopy;  Laterality: N/A;  2 bands placed   INCISION AND DRAINAGE ABSCESS Left 10/11/2013   Procedure: INCISION AND DRAINAGE AND DEBRIDEMENT LEFT BREAST  ABSCESS;  Surgeon: Myrl Askew, MD;  Location: [facility];  Service: General;  Laterality: Left;   INSERTION OF DIALYSIS CATHETER N/A 06/08/2018   Procedure: INSERTION OF Right internal jugular TUNNELED  DIALYSIS CATHETER;  Surgeon: Dannis Dy, MD;  Location: [facility] OR;  Service: Vascular;  Laterality: N/A;   IRRIGATION AND DEBRIDEMENT ABSCESS Right 06/01/2013   Procedure: INCISION AND DRAINAGE AND DEBRIDEMENT ABSCESS RIGHT BREAST;  Surgeon: Myrl Askew, MD;  Location: [facility];  Service: General;  Laterality: Right;   LEFT EYE REMOVED Left 01/2018   [facility] [address].   LIGATION OF ARTERIOVENOUS  FISTULA Right 06/08/2018   Procedure: LIGATION OF ARTERIOVENOUS  FISTULA RIGHT ARM;  Surgeon: Dannis Dy, MD;  Location: [facility] OR;  Service: Vascular;  Laterality: Right;   ORIF ANKLE FRACTURE Right 12/17/2018   Procedure: OPEN REDUCTION INTERNAL FIXATION (ORIF) ANKLE FRACTURE;  Surgeon: Dot Gazella, DPM;  Location: [facility] OR;  Service: Podiatry;  Laterality: Right;   REVISON OF ARTERIOVENOUS FISTULA Left 09/26/2020   Procedure: LEFT ARTERIOVENOUS FISTULA  DISTAL REVASCULARIZATION AND LIGATION ( DRIL ) USING GREATER SAPHENOUS VEIN;  Surgeon: Adine Hoof, MD;  Location: [facility] OR;  Service: Vascular;  Laterality: Left;   SAVORY DILATION N/A 05/17/2013   Procedure: SAVORY DILATION;  Surgeon: Alyce Jubilee, MD;  Location: [facility];  Service: Endoscopy;  Laterality: N/A;  14/15/16   SKIN FULL THICKNESS GRAFT Left 05/05/2018   Procedure: ABDOMINAL DERMIS  FAT SKIN GRAFT FULL THICKNESS LEFT EYE;  Surgeon: Karan Osgood, MD;  Location: [facility] OR;  Service: Ophthalmology;  Laterality: Left;   TUBAL LIGATION     VEIN HARVEST Left 09/26/2020   Procedure: LEFT LEG GREATER SAPHENOUS VEIN HARVEST;  Surgeon: Adine Hoof, MD;  Location: [facility] OR;  Service: Vascular;  Laterality: Left;    Family History  Adopted: Yes  Family history unknown: Yes    Social History   Tobacco Use   Smoking status: Some Days    Current packs/day: 0.00    Average packs/day: 1.5 packs/day for 25.0 years (37.5 ttl pk-yrs)    Types: Cigarettes, E-cigarettes    Start date: 10/23/1982    Last attempt to quit: 10/23/2007    Years since quitting: 16.0   Smokeless tobacco: Never   Tobacco comments:    I vape once in a while.  Vaping Use   Vaping status: Never Used  Substance Use Topics   Alcohol use: No   Drug use: No    Medications: I have  reviewed the patient's current medications. Prior to Admission:  Medications Prior to Admission  Medication Sig Dispense Refill Last Dose/Taking   ADVAIR DISKUS 250-50 MCG/ACT AEPB Inhale 1 puff into the lungs 2 (two) times daily.   Past Week   albuterol (PROVENTIL) (2.5 MG/3ML) 0.083% nebulizer solution Take 3 mLs (2.5 mg total) by nebulization every 12 (twelve) hours as needed for wheezing or shortness of breath. 75 mL 0 Past Week   ALPRAZolam (XANAX) 1 MG tablet Take 1 tablet (1 mg total) by mouth 4 (four) times daily as needed for anxiety. (Patient taking differently: Take 1 mg by mouth 4 (four) times daily.) 9 tablet 0 11/01/2023   apixaban (ELIQUIS) 5 MG TABS tablet Take 2 tablets (10 mg total) by mouth 2 (two) times daily for 7 days, THEN 1 tablet (5 mg total) 2 (two) times daily for 23 days. 74 tablet 0 10/30/2023   Artificial Tear Ointment (DRY EYES OP) Place 1 drop into the right eye 2 (two) times daily.   11/01/2023   atorvastatin (LIPITOR) 20 MG tablet Take 20 mg by mouth daily.   11/01/2023   atropine (ISOPTO  ATROPINE) 1 % ophthalmic solution Place 1 drop into the left eye 3 (three) times daily.   11/01/2023   brimonidine (ALPHAGAN) 0.2 % ophthalmic solution Place 1 drop into the left eye 3 (three) times daily.   11/01/2023   busPIRone (BUSPAR) 7.5 MG tablet Take 1 tablet (7.5 mg total) by mouth 2 (two) times daily. 7 tablet 0 11/01/2023   cinacalcet (SENSIPAR) 60 MG tablet Take 60 mg by mouth daily.   11/01/2023   colchicine 0.6 MG tablet TAKE 1 TABLET(0.6 MG) BY MOUTH DAILY 30 tablet 0 11/01/2023   doxercalciferol (HECTOROL) 4 MCG/2ML injection Inject 0.5 mLs (1 mcg total) into the vein every Monday, Wednesday, and Friday with hemodialysis. 2 mL  10/30/2023   gabapentin (NEURONTIN) 100 MG capsule Take 1 capsule (100 mg total) by mouth at bedtime. (Patient taking differently: Take 300 mg by mouth at bedtime.) 7 capsule 0 11/01/2023   HYDROcodone-acetaminophen (NORCO) 10-325 MG tablet Take 1 tablet by mouth 4 (four) times daily.   11/01/2023   insulin glargine (LANTUS SOLOSTAR) 100 UNIT/ML Solostar Pen Inject 30 Units into the skin daily.   11/01/2023   latanoprost (XALATAN) 0.005 % ophthalmic solution Place 1 drop into the left eye 2 (two) times daily.   11/01/2023   omeprazole (PRILOSEC) 40 MG capsule Take 40 mg by mouth daily.   11/01/2023   ondansetron (ZOFRAN) 4 MG tablet TAKE (1) TABLET THREE TIMES DAILY AS NEEDED FOR NAUSEA.   11/01/2023   PARoxetine (PAXIL) 20 MG tablet Take 1 tablet (20 mg total) by mouth every evening. This is to prevent panic attacks (Patient taking differently: Take 20 mg by mouth at bedtime.) 7 tablet 0 11/01/2023   VELPHORO 500 MG chewable tablet Chew 1,000 mg by mouth 3 (three) times daily.   11/01/2023   Blood Glucose Monitoring Suppl (FORA V30A BLOOD GLUCOSE SYSTEM) DEVI       FORACARE PREMIUM V10 TEST test strip       Scheduled:  apixaban  5 mg Oral BID   atorvastatin  20 mg Oral Daily   busPIRone  7.5 mg Oral BID   calcitRIOL  2 mcg Oral Q M,W,F   Chlorhexidine Gluconate Cloth  6 each  Topical Q0600   Chlorhexidine Gluconate Cloth  6 each Topical Q0600   cinacalcet  30 mg Oral Q M,W,F  darbepoetin (ARANESP) injection - DIALYSIS  60 mcg Subcutaneous Q Wed-1800   gabapentin  300 mg Oral QHS   liver oil-zinc oxide   Topical BID   mupirocin ointment  1 Application Nasal BID   nystatin   Topical TID   pantoprazole  80 mg Oral Daily   PARoxetine  20 mg Oral QHS   sucroferric oxyhydroxide  1,000 mg Oral TID WC   Continuous: [id number] **OR** acetaminophen, ALPRAZolam, lidocaine (PF), lidocaine-prilocaine, loperamide, ondansetron **OR** ondansetron (ZOFRAN) IV, oxyCODONE, pentafluoroprop-tetrafluoroeth  Allergies  Allergen Reactions   Codeine Nausea And Vomiting   Tape Other (See Comments) and Rash    Pull skin off.  Paper tape is ok     ROS:  sleepy and not answering many questions  Review of systems not obtained due to patient factors.  Blood pressure (!) 119/55, pulse 79, temperature 98 F (36.7 C), temperature source Oral, resp. rate (!) 22, height 5\' 8"  (1.727 m), weight 119.8 kg, last menstrual period 08/03/2008, SpO2 93%. Physical Exam Vitals reviewed.  Constitutional:      Appearance: She is obese.  HENT:     Head:     Comments: Left eye with drainage/ blood around it    Nose: Nose normal.  Cardiovascular:     Rate and Rhythm: Normal rate.  Pulmonary:     Effort: Pulmonary effort is normal.  Abdominal:     Palpations: Abdomen is soft.  Musculoskeletal:     Comments: Right buttock with induration and some sloughing of the skin, pressure ulcer type lesion noted, no obvious fluctuant area, some fluid on the chair pad noted   Neurological:     General: No focal deficit present.  Psychiatric:        Mood and Affect: Mood normal.        Results: Results for orders placed or performed during the hospital encounter of 11/03/23 (from the past 48 hours)  MRSA Next Gen by PCR, Nasal     Status: Abnormal   Collection Time: 11/05/23 12:00 PM    Specimen: Nasal Mucosa; Nasal Swab  Result Value Ref Range   MRSA by PCR Next Gen DETECTED (A) NOT DETECTED    Comment: RESULT CALLED TO, READ BACK BY AND VERIFIED WITH: D HAIRSTON AT 1356 11/05/23 BY A WILSON (NOTE) The GeneXpert MRSA Assay (FDA approved for NASAL specimens only), is one component of a comprehensive MRSA colonization surveillance program. It is not intended to diagnose MRSA infection nor to guide or monitor treatment for MRSA infections. Test performance is not FDA approved in patients less than 104 years old. Performed at [facility], [address] [place], [place] [address]   Renal function panel     Status: Abnormal   Collection Time: 11/06/23  5:20 AM  Result Value Ref Range   Sodium 135 135 - 145 mmol/L   Potassium 3.9 3.5 - 5.1 mmol/L   Chloride 99 98 - 111 mmol/L   CO2 22 22 - 32 mmol/L   Glucose, Bld 131 (H) 70 - 99 mg/dL    Comment: Glucose reference range applies only to samples taken after fasting for at least 8 hours.   BUN 45 (H) 6 - 20 mg/dL   Creatinine, Ser 9.56 (H) 0.44 - 1.00 mg/dL   Calcium 8.8 (L) 8.9 - 10.3 mg/dL   Phosphorus 3.9 2.5 - 4.6 mg/dL   Albumin 2.5 (L) 3.5 - 5.0 g/dL   GFR, Estimated 5 (L) >60 mL/min    Comment: (NOTE) Calculated  using the CKD-EPI Creatinine Equation (2021)    Anion gap 14 5 - 15    Comment: Performed at [facility], [address], [place], [place] [address]  CBC     Status: Abnormal   Collection Time: 11/06/23  6:34 AM  Result Value Ref Range   WBC 9.4 4.0 - 10.5 K/uL   RBC 3.74 (L) 3.87 - 5.11 MIL/uL   Hemoglobin 10.3 (L) 12.0 - 15.0 g/dL   HCT 65.7 (L) 84.6 - 96.2 %   MCV 93.9 80.0 - 100.0 fL   MCH 27.5 26.0 - 34.0 pg   MCHC 29.3 (L) 30.0 - 36.0 g/dL   RDW 95.2 (H) 84.1 - 32.4 %   Platelets 70 (L) 150 - 400 K/uL    Comment: SPECIMEN CHECKED FOR CLOTS Immature Platelet Fraction may be clinically indicated, consider ordering this additional test [id number] CONSISTENT WITH PREVIOUS RESULT REPEATED TO  VERIFY    nRBC 0.0 0.0 - 0.2 %    Comment: Performed at [facility], [address] [place], [place] [address]   Personally reviewed CT from 4/8- no obvious abscess at that time   Assessment & Plan:  MATHEW STORCK is a 58 y.o. female with concern for induration/ abscess on the right buttock. This could be a pressure wound. It is hard to fell any obvious fluctuance. She is on Eliquis. She is more sleepy right now but received pain medication per the RN.   CT pelvis to assess, did w/o contrast given renal disease   All questions were answered to the satisfaction of the patient.  Updated RN and Dr. Alfonse Angle.   Awilda Bogus 11/07/2023, 10:24 AM

## 2023-11-07 NOTE — Progress Notes (Signed)
 [facility]  PROGRESS NOTE  Martha [name] ZOX:[id number] DOB: 10-12-1965 DOA: 11/03/2023 PCP: Forest Idol, NP   Brief HPI:   58 y.o. female with medical history significant of  ESRD on HD (MWF) COPD, hypertension, NASH, AOCD, left eye blindness, type 2 diabetes mellitus with retinopathy, RLS, CHF who presents to the emergency department due to lower abdominal pain, nausea, NBNB vomiting.  Abdominal pain was sharp in nature, it was intermittent with no known alleviating/aggravating factors.  Last dialysis was on 10/21/2023 due to feeling unwell  BMP was normal except for bicarb 20, blood glucose 123, BUN 42, creatinine 1.47.  Anion gap 20.  Lactic acid 3.8 > 2.4.  Troponin 12 > 14, BNP 1300 (this was 1,369 on 08/18/2023).  TSH 5.193.  Influenza A, B, SARS coronavirus 2, RSV was negative. CT chest, abdomen and pelvis without contrast was negative for acute intrathoracic or intra-abdominal/pelvic abnormality.  Erosive changes at the pubic symphysis considered to be secondary to inflammatory process osteitis or infection.   Assessment/Plan:   Acute febrile illness -Tmax 103 F; resolved -Has been afebrile for past 48 hours -Unclear etiology -Started on empiric vancomycin and Zosyn; discontinued on 11/05/2023 -Blood cultures x 2 are negative to date   Buttock/pelvic pain/?  Buttock abscess -Patient has been complaining of buttock pain - CT pelvis shows erosive changes at the pubic symphysis likely from inflammatory process versus osteitis versus infection -Consulted orthopedics, no further recommendations per orthopedics. - Likely has buttock abscess, will consult general surgery for possible incision and drainage     Nausea, vomiting and abdominal pain Continue Zofran as needed Continue Dilaudid as needed    ESRD on HD BNP 1,300(this was 1,369 on 08/18/2023) This is possibly due to fluid overload, considering patient does not have a dialysis in about 1 week Nephrologist (Dr.  Irene Mannheim) was consulted for maintenance dialysis in the morning per EDP -Patient has been missing hemodialysis due to pain, nephrology recommending palliative care consult for goals of care - Perative care consulted, continue dialysis as outpatient   Lactic acidosis Elevated anion gap Lactic acid 3.8 > 2.4, anion gap 20 This is possibly related to several missed dialysis sessions Nephrology consulted for maintenance dialysis  Hypertension -Blood pressure has been elevated -Start hydralazine as needed   Obesity class III (BMI of 40.88) Patient was counseled about the cardiovascular and metabolic risk of morbid obesity. Patient was counseled for diet control, exercise regimen and weight loss.    History of calciphylaxis Continue sensipar as well as Hectorol    Thrombocytopenia Platelets 82, continue to monitor platelet levels   OSA Continue CPAP.     Restless leg syndrome Continue gabapentin.    GERD Continue Protonix   Depression, anxiety, bipolar disorder Continue buspar and paroxetine.     Mixed hyperlipidemia Continue statin    Medications     apixaban  5 mg Oral BID   atorvastatin  20 mg Oral Daily   busPIRone  7.5 mg Oral BID   calcitRIOL  2 mcg Oral Q M,W,F   Chlorhexidine Gluconate Cloth  6 each Topical Q0600   Chlorhexidine Gluconate Cloth  6 each Topical Q0600   cinacalcet  30 mg Oral Q M,W,F   darbepoetin (ARANESP) injection - DIALYSIS  60 mcg Subcutaneous Q Wed-1800   gabapentin  300 mg Oral QHS   liver oil-zinc oxide   Topical BID   mupirocin ointment  1 Application Nasal BID   nystatin   Topical TID   pantoprazole  80 mg Oral Daily   PARoxetine  20 mg Oral QHS   sucroferric oxyhydroxide  1,000 mg Oral TID WC     Data Reviewed:   CBG:  Recent Labs  Lab 11/03/23 1645  GLUCAP 117*    SpO2: 93 % O2 Flow Rate (L/min): 2 L/min FiO2 (%): 21 %    Vitals:   11/06/23 1459 11/06/23 1952 11/06/23 2257 11/07/23 0429  BP: (!) 134/55 (!)  129/91  (!) 119/55  Pulse: 91 99 (!) 56 79  Resp: 16 20 18  (!) 22  Temp: 98.6 F (37 C) 97.9 F (36.6 C)  98 F (36.7 C)  TempSrc: Oral Oral  Oral  SpO2: 99% 93% 95% 93%  Weight:      Height:          Data Reviewed:  Basic Metabolic Panel: Recent Labs  Lab 11/03/23 1714 11/04/23 0246 11/06/23 0520  NA 140 139 135  K 5.1 4.7 3.9  CL 100 102 99  CO2 20* 21* 22  GLUCOSE 123* 148* 131*  BUN 42* 46* 45*  CREATININE 11.47* 11.86* 8.25*  CALCIUM 9.3 9.1 8.8*  MG  --  1.6*  --   PHOS  --  4.1 3.9    CBC: Recent Labs  Lab 11/03/23 1714 11/04/23 0246 11/06/23 0634  WBC 5.9 5.8 9.4  NEUTROABS 4.9  --   --   HGB 9.3* 8.3* 10.3*  HCT 31.7* 27.9* 35.1*  MCV 94.9 94.6 93.9  PLT 82* 67* 70*    LFT Recent Labs  Lab 11/03/23 1714 11/04/23 0246 11/06/23 0520  AST 15 9*  --   ALT 7 8  --   ALKPHOS 104 89  --   BILITOT 1.6* 1.8*  --   PROT 7.1 6.4*  --   ALBUMIN 3.5 3.0* 2.5*     Antibiotics: Anti-infectives (From admission, onward)    Start     Dose/Rate Route Frequency Ordered Stop   11/05/23 1130  piperacillin-tazobactam (ZOSYN) 2.25 g in sodium chloride 0.9 % 50 mL IVPB  Status:  Discontinued        2.25 g 100 mL/hr over 30 Minutes Intravenous Every 8 hours 11/05/23 1054 11/05/23 1127   11/04/23 1200  vancomycin (VANCOCIN) IVPB 1000 mg/200 mL premix  Status:  Discontinued        1,000 mg 200 mL/hr over 60 Minutes Intravenous Every M-W-F (Hemodialysis) 11/04/23 0250 11/05/23 1127   11/04/23 0600  piperacillin-tazobactam (ZOSYN) IVPB 2.25 g  Status:  Discontinued        2.25 g 100 mL/hr over 30 Minutes Intravenous Every 8 hours 11/04/23 0250 11/05/23 1054   11/03/23 2315  vancomycin (VANCOREADY) IVPB 2000 mg/400 mL        2,000 mg 200 mL/hr over 120 Minutes Intravenous  Once 11/03/23 2300 11/04/23 0229   11/03/23 2315  piperacillin-tazobactam (ZOSYN) IVPB 3.375 g        3.375 g 100 mL/hr over 30 Minutes Intravenous  Once 11/03/23 2300 11/04/23 0024         DVT prophylaxis: Eliquis  Code Status: Full code  Family Communication: Discussed with patient's daughter at bedside   CONSULTS nephrology   Subjective   Still complains of right buttock pain   Objective    Physical Examination:   General-appears in no acute distress Heart-S1-S2, regular, no murmur auscultated Lungs-clear to auscultation bilaterally, no wheezing or crackles auscultated Abdomen-soft, nontender, no organomegaly Extremities-no edema in the lower extremities Right buttock-indurated area noted at the  right buttock with dried scab, tenderness to palpation Neuro-alert, oriented x3, no focal deficit noted   Status is: Inpatient:             Martha [name]   [facility] If 7PM-7AM, please contact night-coverage at [email or web address], Office  [phone]   11/07/2023, 8:16 AM  LOS: 4 days

## 2023-11-08 DIAGNOSIS — R509 Fever, unspecified: Secondary | ICD-10-CM | POA: Diagnosis not present

## 2023-11-08 DIAGNOSIS — K219 Gastro-esophageal reflux disease without esophagitis: Secondary | ICD-10-CM | POA: Diagnosis not present

## 2023-11-08 DIAGNOSIS — R1030 Lower abdominal pain, unspecified: Secondary | ICD-10-CM | POA: Diagnosis not present

## 2023-11-08 LAB — CULTURE, BLOOD (ROUTINE X 2): Culture: NO GROWTH

## 2023-11-08 MED ORDER — SODIUM CHLORIDE 0.9 % IV SOLN
2.0000 g | Freq: Once | INTRAVENOUS | Status: AC
  Administered 2023-11-08: 2 g via INTRAVENOUS
  Filled 2023-11-08: qty 12.5

## 2023-11-08 MED ORDER — VANCOMYCIN HCL IN DEXTROSE 1-5 GM/200ML-% IV SOLN
1000.0000 mg | INTRAVENOUS | Status: DC
  Administered 2023-11-09 – 2023-11-16 (×4): 1000 mg via INTRAVENOUS
  Filled 2023-11-08 (×4): qty 200

## 2023-11-08 MED ORDER — SODIUM CHLORIDE 0.9 % IV SOLN
2.0000 g | INTRAVENOUS | Status: DC
  Administered 2023-11-09 – 2023-11-16 (×4): 2 g via INTRAVENOUS
  Filled 2023-11-08 (×4): qty 12.5

## 2023-11-08 MED ORDER — VANCOMYCIN HCL IN DEXTROSE 1-5 GM/200ML-% IV SOLN
1000.0000 mg | Freq: Once | INTRAVENOUS | Status: AC
  Administered 2023-11-08: 1000 mg via INTRAVENOUS
  Filled 2023-11-08: qty 200

## 2023-11-08 NOTE — Plan of Care (Signed)
   Problem: Education: Goal: Knowledge of General Education information will improve Description: Including pain rating scale, medication(s)/side effects and non-pharmacologic comfort measures Outcome: Progressing   Problem: Health Behavior/Discharge Planning: Goal: Ability to manage health-related needs will improve Outcome: Progressing   Problem: Clinical Measurements: Goal: Ability to maintain clinical measurements within normal limits will improve Outcome: Progressing Goal: Will remain free from infection Outcome: Progressing Goal: Diagnostic test results will improve Outcome: Progressing Goal: Respiratory complications will improve Outcome: Progressing Goal: Cardiovascular complication will be avoided Outcome: Progressing   Problem: Activity: Goal: Risk for activity intolerance will decrease Outcome: Progressing   Problem: Nutrition: Goal: Adequate nutrition will be maintained Outcome: Progressing   Problem: Coping: Goal: Level of anxiety will decrease Outcome: Progressing   Problem: Elimination: Goal: Will not experience complications related to bowel motility Outcome: Progressing Goal: Will not experience complications related to urinary retention Outcome: Progressing   Problem: Pain Managment: Goal: General experience of comfort will improve and/or be controlled Outcome: Progressing   Problem: Safety: Goal: Ability to remain free from injury will improve Outcome: Progressing   Problem: Skin Integrity: Goal: Risk for impaired skin integrity will decrease Outcome: Progressing   Problem: Clinical Measurements: Goal: Ability to avoid or minimize complications of infection will improve Outcome: Progressing   Problem: Skin Integrity: Goal: Skin integrity will improve Outcome: Progressing   Problem: Clinical Measurements: Goal: Ability to avoid or minimize complications of infection will improve Outcome: Progressing   Problem: Skin Integrity: Goal: Skin  integrity will improve Outcome: Progressing

## 2023-11-08 NOTE — Progress Notes (Addendum)
 Pharmacy Antibiotic Note  Martha [name] is a 58 y.o. female admitted on 11/03/2023 with cellulitis.  Pharmacy has been consulted for vancomycin and cefepime dosing.  Plan: Vancomycin 1000 mg IV every MWF w/ HD Cefepime 2000 mg IV every MWF w/ HD Monitor labs, c/s, and vanco levels as indicated  Height: 5\' 8"  (172.7 cm) Weight: 119.8 kg (264 lb 1.8 oz) IBW/kg (Calculated) : 63.9  Temp (24hrs), Avg:99.1 F (37.3 C), Min:98.6 F (37 C), Max:99.5 F (37.5 C)  Recent Labs  Lab 11/03/23 1714 11/03/23 1921 11/04/23 0246 11/04/23 0549 11/06/23 0520 11/06/23 0634  WBC 5.9  --  5.8  --   --  9.4  CREATININE 11.47*  --  11.86*  --  8.25*  --   LATICACIDVEN 3.8* 2.4* 1.5 1.6  --   --     Estimated Creatinine Clearance: 10.5 mL/min (A) (by C-G formula based on SCr of 8.25 mg/dL (H)).    Allergies  Allergen Reactions   Codeine Nausea And Vomiting   Tape Other (See Comments) and Rash    Pull skin off.  Paper tape is ok    Antimicrobials this admission: Cefepime 4/13 >> Vanco 4/8 >>4/10 restarted 4/13 >> Zosyn 4/8 >>4/10   Microbiology results: 4/8 Bcx: ngtd Respiratory panel: neg  Thank you for allowing pharmacy to be a part of this patient's care.  Alicia Apa 11/08/2023 8:56 AM

## 2023-11-08 NOTE — Consult Note (Signed)
 WOC Nurse Consult Note: patient has R glute DTPI that has been evaluated by surgery with no surgical intervention deemed necessary  Reason for Consult: R buttock wound  Wound type: Deep Tissue Pressure Injury that is evolving with some partial  full thickness skin loss  Pressure Injury POA: Yes Measurement: see nursing flowsheet  Wound bed: purple maroon discoloration with sloughing skin and dark hemorrhagic tissue  Drainage (amount, consistency, odor) see nursing flowsheet  Periwound: erythema  Dressing procedure/placement/frequency: Cleanse R buttock with soap and water, dry and cover PI with Xeroform gauze Timm Foot [phone]) daily, apply Desitin to intact surrounding skin then cover with silicone foam or ABD pad whichever is preferred.   Patient would benefit from a low air loss mattress for pressure redistribution and moisture management. Also would benefit from a pressure redistribution cushion Timm Foot [phone]) for when sitting up in chair especially during dialysis.   POC discussed with bedside nurse. WOC team will not follow. Re-consult if further needs arise.  Thank you,    Ronni Colace MSN, RN-BC, [employer] [phone]

## 2023-11-08 NOTE — Progress Notes (Signed)
 [facility]  CT without any abscess. Maybe some cellulitis. Treat with antibiotics. No surgical intervention indicated. Signing off.  Deena Farrier, MD [facility] [address]ise Barlow [place], [place] [address] [phone] (office)

## 2023-11-08 NOTE — Progress Notes (Addendum)
 [facility]  PROGRESS NOTE  Martha [name] WUJ:[id number] DOB: March 04, 1966 DOA: 11/03/2023 PCP: Forest Idol, NP   Brief HPI:   58 y.o. female with medical history significant of  ESRD on HD (MWF) COPD, hypertension, NASH, AOCD, left eye blindness, type 2 diabetes mellitus with retinopathy, RLS, CHF who presents to the emergency department due to lower abdominal pain, nausea, NBNB vomiting.  Abdominal pain was sharp in nature, it was intermittent with no known alleviating/aggravating factors.  Last dialysis was on 10/21/2023 due to feeling unwell  BMP was normal except for bicarb 20, blood glucose 123, BUN 42, creatinine 1.47.  Anion gap 20.  Lactic acid 3.8 > 2.4.  Troponin 12 > 14, BNP 1300 (this was 1,369 on 08/18/2023).  TSH 5.193.  Influenza A, B, SARS coronavirus 2, RSV was negative. CT chest, abdomen and pelvis without contrast was negative for acute intrathoracic or intra-abdominal/pelvic abnormality.  Erosive changes at the pubic symphysis considered to be secondary to inflammatory process osteitis or infection.   Assessment/Plan:   Buttock cellulitis - Initially presented with fever, started empirically on vancomycin and Zosyn - Fever had resolved so antibiotics were discontinued - Started having drainage from right buttock, general surgery was consulted for possible abscess - CT of the pelvis showed no drainable abscess but showed cellulitis - Will restart antibiotics vancomycin and cefepime - Will consult wound care for topical treatment for superficial buttock wound   Pubic symphysis inflammation - CT pelvis shows erosive changes at the pubic symphysis likely from inflammatory process versus osteitis versus infection -Consulted orthopedics, no further recommendations per orthopedics. 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 Irene Mannheim) was consulted for maintenance dialysis in the morning per EDP -Patient has been missing hemodialysis due to pain, nephrology recommending palliative care consult for goals of care - Palliative care consulted, continue dialysis as outpatient   Lactic acidosis Elevated anion gap Lactic acid 3.8 > 2.4, anion gap 20 This is possibly related to several missed dialysis sessions Nephrology consulted for maintenance dialysis  Hypertension -Blood pressure has been elevated -Start hydralazine as needed   Obesity class III (BMI of 40.88) Patient was counseled about the cardiovascular and metabolic risk of morbid obesity. Patient was counseled for diet control, exercise regimen and weight loss.    History of calciphylaxis Continue sensipar as well as Hectorol    Thrombocytopenia Platelets 70, continue to monitor platelet levels -Likely due to splenic sequestration  Splenic infarct -Continue Eliquis   OSA Continue CPAP.     Restless leg syndrome Continue gabapentin.    GERD Continue Protonix   Depression, anxiety, bipolar disorder Continue buspar and paroxetine.     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  - Started on triple antibiotic ophthalmic ointment twice daily   Medications     apixaban  5 mg Oral BID   atorvastatin  20 mg Oral Daily   bacitracin-polymyxin b   Left Eye BID   busPIRone  7.5 mg Oral BID   calcitRIOL  2 mcg Oral Q M,W,F   Chlorhexidine Gluconate Cloth  6 each Topical Q0600   Chlorhexidine Gluconate Cloth  6 each Topical Q0600   cinacalcet  30 mg Oral Q M,W,F   darbepoetin (ARANESP) injection - DIALYSIS  60 mcg Subcutaneous Q Wed-1800   gabapentin  300 mg Oral QHS   liver oil-zinc oxide   Topical BID    mupirocin ointment  1 Application Nasal BID   nystatin   Topical TID   pantoprazole  80 mg Oral Daily   PARoxetine  20 mg Oral QHS   sucroferric oxyhydroxide  1,000 mg Oral TID WC     Data Reviewed:   CBG:  Recent Labs  Lab 11/03/23 1645  GLUCAP 117*    SpO2: 100 % O2 Flow Rate (L/min): 2 L/min FiO2 (%): 21 %    Vitals:   11/07/23 0429 11/07/23 1324 11/07/23 2016 11/08/23 0513  BP: (!) 119/55 112/68 (!) 114/45 (!) 127/57  Pulse: 79 84 (!) 110 91  Resp: (!) 22 20 18 17   Temp: 98 F (36.7 C) 98.6 F (37 C) 99.3 F (37.4 C) 99.5 F (37.5 C)  TempSrc: Oral Oral Oral Axillary  SpO2: 93% 94% 94% 100%  Weight:      Height:          Data Reviewed:  Basic Metabolic Panel: Recent Labs  Lab 11/03/23 1714 11/04/23 0246 11/06/23 0520  NA 140 139 135  K 5.1 4.7 3.9  CL 100 102 99  CO2 20* 21* 22  GLUCOSE 123* 148* 131*  BUN 42* 46* 45*  CREATININE 11.47* 11.86* 8.25*  CALCIUM 9.3 9.1 8.8*  MG  --  1.6*  --   PHOS  --  4.1 3.9    CBC: Recent Labs  Lab 11/03/23 1714 11/04/23 0246 11/06/23 0634  WBC 5.9 5.8 9.4  NEUTROABS 4.9  --   --   HGB 9.3* 8.3* 10.3*  HCT 31.7* 27.9* 35.1*  MCV 94.9 94.6 93.9  PLT 82* 67* 70*    LFT Recent Labs  Lab 11/03/23 1714 11/04/23 0246 11/06/23 0520  AST 15 9*  --   ALT 7 8  --   ALKPHOS 104 89  --   BILITOT 1.6* 1.8*  --   PROT 7.1 6.4*  --   ALBUMIN 3.5 3.0* 2.5*     Antibiotics: Anti-infectives (From admission, onward)    Start     Dose/Rate Route Frequency Ordered Stop   11/05/23 1130  piperacillin-tazobactam (ZOSYN) 2.25 g in sodium chloride 0.9 % 50 mL IVPB  Status:  Discontinued        2.25 g 100 mL/hr over 30 Minutes Intravenous Every 8 hours 11/05/23 1054 11/05/23 1127   11/04/23 1200  vancomycin (VANCOCIN) IVPB 1000 mg/200 mL premix  Status:  Discontinued        1,000 mg 200 mL/hr over 60 Minutes Intravenous Every M-W-F (Hemodialysis) 11/04/23 0250 11/05/23 1127   11/04/23 0600   piperacillin-tazobactam (ZOSYN) IVPB 2.25 g  Status:  Discontinued        2.25 g 100 mL/hr over 30 Minutes Intravenous Every 8 hours 11/04/23 0250 11/05/23 1054   11/03/23 2315  vancomycin (VANCOREADY) IVPB 2000 mg/400 mL        2,000 mg 200 mL/hr over 120 Minutes Intravenous  Once 11/03/23 2300 11/04/23 0229   11/03/23 2315  piperacillin-tazobactam (ZOSYN) IVPB 3.375 g        3.375 g 100 mL/hr over 30 Minutes Intravenous  Once 11/03/23 2300 11/04/23 0024        DVT prophylaxis: Eliquis  Code Status: Full code  Family Communication:  Discussed with patient's daughter at bedside   CONSULTS nephrology   Subjective   Still complains of buttock pain.  Appreciate general surgery input.   Objective    Physical Examination:  General-appears in no acute distress Heart-S1-S2, regular, no murmur auscultated Lungs-clear to auscultation bilaterally, no wheezing or crackles auscultated Abdomen-soft, nontender, no organomegaly Extremities-no edema in the lower extremities Right buttock-induration with superficial dried scab noted in the right buttock Neuro-alert, oriented x3, no focal deficit noted   Status is: Inpatient:             Ozell Blunt   [facility] If 7PM-7AM, please contact night-coverage at [email or web address], Office  [phone]   11/08/2023, 7:57 AM  LOS: 5 days

## 2023-11-09 DIAGNOSIS — N186 End stage renal disease: Secondary | ICD-10-CM | POA: Diagnosis not present

## 2023-11-09 DIAGNOSIS — L03317 Cellulitis of buttock: Secondary | ICD-10-CM | POA: Diagnosis not present

## 2023-11-09 DIAGNOSIS — K219 Gastro-esophageal reflux disease without esophagitis: Secondary | ICD-10-CM | POA: Diagnosis not present

## 2023-11-09 DIAGNOSIS — Z515 Encounter for palliative care: Secondary | ICD-10-CM | POA: Diagnosis not present

## 2023-11-09 DIAGNOSIS — R509 Fever, unspecified: Secondary | ICD-10-CM | POA: Diagnosis not present

## 2023-11-09 DIAGNOSIS — E872 Acidosis, unspecified: Secondary | ICD-10-CM | POA: Diagnosis not present

## 2023-11-09 DIAGNOSIS — Z7189 Other specified counseling: Secondary | ICD-10-CM | POA: Diagnosis not present

## 2023-11-09 LAB — RENAL FUNCTION PANEL
Albumin: 2.1 g/dL — ABNORMAL LOW (ref 3.5–5.0)
Anion gap: 14 (ref 5–15)
BUN: 54 mg/dL — ABNORMAL HIGH (ref 6–20)
CO2: 25 mmol/L (ref 22–32)
Calcium: 9.4 mg/dL (ref 8.9–10.3)
Chloride: 93 mmol/L — ABNORMAL LOW (ref 98–111)
Creatinine, Ser: 7.96 mg/dL — ABNORMAL HIGH (ref 0.44–1.00)
GFR, Estimated: 6 mL/min — ABNORMAL LOW (ref >60)
Glucose, Bld: 154 mg/dL — ABNORMAL HIGH (ref 70–99)
Phosphorus: 4.2 mg/dL (ref 2.5–4.6)
Potassium: 3.8 mmol/L (ref 3.5–5.1)
Sodium: 132 mmol/L — ABNORMAL LOW (ref 135–145)

## 2023-11-09 LAB — CBC
HCT: 29.4 % — ABNORMAL LOW (ref 36.0–46.0)
Hemoglobin: 8.9 g/dL — ABNORMAL LOW (ref 12.0–15.0)
MCH: 27.5 pg (ref 26.0–34.0)
MCHC: 30.3 g/dL (ref 30.0–36.0)
MCV: 90.7 fL (ref 80.0–100.0)
Platelets: 112 10*3/uL — ABNORMAL LOW (ref 150–400)
RBC: 3.24 MIL/uL — ABNORMAL LOW (ref 3.87–5.11)
RDW: 18.5 % — ABNORMAL HIGH (ref 11.5–15.5)
WBC: 18.3 10*3/uL — ABNORMAL HIGH (ref 4.0–10.5)
nRBC: 0.1 % (ref 0.0–0.2)

## 2023-11-09 MED ORDER — MEDIHONEY WOUND/BURN DRESSING EX PSTE
1.0000 | PASTE | Freq: Every day | CUTANEOUS | Status: DC
  Administered 2023-11-09 – 2023-11-11 (×3): 1 via TOPICAL
  Filled 2023-11-09: qty 44

## 2023-11-09 MED ORDER — NALOXONE HCL 0.4 MG/ML IJ SOLN
0.4000 mg | INTRAMUSCULAR | Status: DC | PRN
  Administered 2023-11-09 – 2023-11-10 (×2): 0.4 mg via INTRAVENOUS
  Filled 2023-11-09 (×2): qty 1

## 2023-11-09 MED ORDER — PENTAFLUOROPROP-TETRAFLUOROETH EX AERO
INHALATION_SPRAY | CUTANEOUS | Status: AC
  Filled 2023-11-09: qty 30

## 2023-11-09 MED ORDER — OXYCODONE HCL 5 MG PO TABS: 5.0000 mg | ORAL_TABLET | Freq: Four times a day (QID) | ORAL | Status: DC | PRN

## 2023-11-09 NOTE — Progress Notes (Addendum)
 [facility] Progress Note  Name: Martha [name] MRN: [id number] DOB: [birth year]  Chief Complaint:  Fever and generalized aches   Subjective:  Patient seen and examined bedside. Sound asleep but easily arousable to tactile/vocal stimuli. When prompted denies any pain, N/V, SOB, CP.  --------------  Background on consult:  Martha [name] is a/an 58 y.o. female with a past medical history of ESRD who presents with generalized aches.   Patient was unable to give history.  Unclear if this was because she was altered or if because she was in pain and had difficulty giving details.  She often responded I do not know.  I did discuss her care with Aram Beecham who is listed as her daughter but is actually not related and is a Development worker, international aid that takes care of the patient. The patient was recently hospitalized and discharged on 3/28.  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  She was having abdominal pain, nausea, vomiting and it was felt the symptoms were related to the splenic infarct.  Aram Beecham states that the patient has not improved since discharge.  She continues to have poor p.o. intake, nausea, vomiting, abdominal pain.  The patient also endorses generalized bodyaches.  The patient states she is unsure when she last had dialysis but Aram Beecham said it was Friday; other providers have documented last dialysis is being 3/26.  Aram Beecham states that the patient has not been going to dialysis regularly.  She often skips and is noncompliant.  Patient has described the pain in her abdomen as sharp and intermittent.  In the emergency department she was found to be febrile with a temperature of 103 Fahrenheit.  She had some hypoxia that improved with supplemental oxygen.  Lactic acid was initially elevated and then improved.  She was negative for RSV, flu, COVID.  CT of the abdomen did demonstrate erosive changes at the pubic symphysis.   Intake/Output Summary (Last 24 hours) at  11/09/2023 0755 Last data filed at 11/09/2023 0500 Gross per 24 hour  Intake 480 ml  Output --  Net 480 ml    Vitals:  Vitals:   11/08/23 1304 11/08/23 2041 11/09/23 0700 11/09/23 0729  BP: 115/62 (!) 123/49 (!) 94/43 (!) 136/59  Pulse: (!) 109 (!) 110 96 93  Resp: 18 16 18 18   Temp: 98.8 F (37.1 C) 98.5 F (36.9 C) 98 F (36.7 C) (!) 97.4 F (36.3 C)  TempSrc:  Oral    SpO2: 90% 92% 100% 98%  Weight:      Height:         Physical Exam:  General: NAD, asleep, laying flat in bed Lungs clear to auscultation bilaterally normal work of breathing Heart  S1S2 no rub Abdomen soft nontender obese habitus Extremities: no sig edema b/l LEs Neuro: asleep but arousable to vocal/tactile stimuli, following some commands  Medications reviewed   Labs:     Latest Ref Rng & Units 11/06/2023    5:20 AM 11/04/2023    2:46 AM 11/03/2023    5:14 PM  BMP  Glucose 70 - 99 mg/dL 433  295  188   BUN 6 - 20 mg/dL 45  46  42   Creatinine 0.44 - 1.00 mg/dL 4.16  60.63  01.60   Sodium 135 - 145 mmol/L 135  139  140   Potassium 3.5 - 5.1 mmol/L 3.9  4.7  5.1   Chloride 98 - 111 mmol/L 99  102  100  CO2 22 - 32 mmol/L 22  21  20    Calcium 8.9 - 10.3 mg/dL 8.8  9.1  9.3    Outpatient dialysis prescription:  [facility]  MWF EDW 120.5kg  4hrs AVF BFR 400/DFR 500 Heparin 1000 bolus and 800/hr Venofer 50 weekly Mircera 200 q2wks Sensipar 30 qtx, calcitriol 2mcg qtx   Assessment/Plan:   # ESRD: - HD per MWF schedule    # Volume/ hypertension: Hypertensive and volume overloaded. Significant body edema on initial CT. UF as tolerated, may need edw adjustment on d/c   # Anemia of Chronic Kidney Disease: - No IV iron for now given infection.  - Aranesp 60 mcg every Wednesday    # Secondary Hyperparathyroidism/Hyperphosphatemia: Ca and phos acceptable. On sensipar and calcitriol   #Buttock cellulitis: On abx per primary team.   # Uncontrolled type 2 diabetes with hyperglycemia: mgmt  per primary team    #GOC:  note patient missing dialysis routinely - often because she is in pain and generally doesn't feel well. Palliative care has seen: treat the treatable, outpatient palliative, continue HD as long as possible  Disposition per primary team   Cristi Donalds, MD 11/09/2023 7:55 AM

## 2023-11-09 NOTE — Consult Note (Signed)
 Received secure chat from primary nurse that wound now has foul smelling drainage.  This wound has been evaluated by surgery, see prior WOC note.    Will change orders to include cleaning with Vashe wound cleanser Timm Foot [phone]) and applying Medihoney to wound bed daily.    POC discussed with bedside nurse and primary MD.  Call placed to materials and message left to bring bottle of Vashe to unit for use.    Patient should be placed on a low air loss mattress for pressure redistribution and moisture management.   Thank you,    Ronni Colace MSN, RN-BC, [employer] [phone]

## 2023-11-09 NOTE — Progress Notes (Signed)
 Palliative:   Face-to-face conference with bedside nursing staff related to patient condition, needs, goals of care, disposition.  [name] is scheduled for hemodialysis today.  Ms. Martha, [name], is lying quietly in bed on an air mattress.  She appears chronically ill and frail, morbidly obese.  She is resting comfortably, but will briefly open her eyes when I call her name.  She is known to be blind, therefore does not make eye contact.  I ask how she is doing.  She states multiple times that she is not doing well.  She is unable to give specifics or answer any other questions.  I do not believe that she can make her basic needs known.  There is no family at bedside at this time.  Per medication report Martha [name] had 1 mg Xanax 3 times yesterday.  This must be contributing to her sleepiness today.  She is scheduled for hemodialysis and it is anticipated that she will be more alert afterwards.  The goal remains for Martha [name] to return to her own home.  She has CAP aid 5 days a week/ four hours per day.  Conference with attending, bedside nursing staff, transition of care team related to patient condition, needs, goals of care, disposition.    Plan: Continue full scope/full code.  HD today.  Home with home health if needed.  Anticipate IV antibiotics to be given at HD sessions if indicated.  25 minutes  Martha Knoche, NP Palliative medicine team Team phone [phone]

## 2023-11-09 NOTE — Progress Notes (Signed)
 [facility]  PROGRESS NOTE  Martha [name] ZOX:[id number] DOB: August 30, 1965 DOA: 11/03/2023 PCP: Forest Idol, NP   Brief HPI:   58 y.o. female with medical history significant of  ESRD on HD (MWF) COPD, hypertension, NASH, AOCD, left eye blindness, type 2 diabetes mellitus with retinopathy, RLS, CHF who presents to the emergency department due to lower abdominal pain, nausea, NBNB vomiting.  Abdominal pain was sharp in nature, it was intermittent with no known alleviating/aggravating factors.  Last dialysis was on 10/21/2023 due to feeling unwell  BMP was normal except for bicarb 20, blood glucose 123, BUN 42, creatinine 1.47.  Anion gap 20.  Lactic acid 3.8 > 2.4.  Troponin 12 > 14, BNP 1300 (this was 1,369 on 08/18/2023).  TSH 5.193.  Influenza A, B, SARS coronavirus 2, RSV was negative. CT chest, abdomen and pelvis without contrast was negative for acute intrathoracic or intra-abdominal/pelvic abnormality.  Erosive changes at the pubic symphysis considered to be secondary to inflammatory process osteitis or infection.   Assessment/Plan:   Buttock cellulitis - Initially presented with fever, started empirically on vancomycin and Zosyn - Fever had resolved so antibiotics were discontinued - Started having drainage from right buttock, general surgery was consulted for possible abscess - CT of the pelvis showed no drainable abscess but showed cellulitis -Restarted vancomycin and cefepime -Wound care RN consulted - Hopefully can be discharged home in next 1 to 2 days on p.o. antibiotics; if continues to do well   Pubic symphysis inflammation - CT pelvis shows erosive changes at the pubic symphysis likely from inflammatory process versus osteitis versus infection -Consulted orthopedics, no further recommendations per orthopedics. 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 Irene Mannheim) was consulted for maintenance dialysis in the morning per EDP -Patient has been missing hemodialysis due to pain, nephrology recommending palliative care consult for goals of care - Palliative care consulted, continue dialysis as outpatient   Lactic acidosis Elevated anion gap Lactic acid 3.8 > 2.4, anion gap 20 This is possibly related to several missed dialysis sessions Nephrology consulted for maintenance dialysis  Hypertension -Blood pressure has been elevated -Start hydralazine as needed   Obesity class III (BMI of 40.88) Patient was counseled about the cardiovascular and metabolic risk of morbid obesity. Patient was counseled for diet control, exercise regimen and weight loss.    History of calciphylaxis Continue sensipar as well as Hectorol    Thrombocytopenia Platelets 70, continue to monitor platelet levels -Likely due to splenic sequestration  Splenic infarct -Continue Eliquis   OSA Continue CPAP.     Restless leg syndrome Continue gabapentin.    GERD Continue Protonix   Depression, anxiety, bipolar disorder Continue buspar and paroxetine.     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  - Started on triple antibiotic ophthalmic ointment twice daily   Medications     apixaban  5 mg Oral BID   atorvastatin  20 mg Oral Daily   bacitracin-polymyxin b   Left Eye BID   busPIRone  7.5 mg Oral BID   calcitRIOL  2 mcg Oral Q M,W,F   Chlorhexidine Gluconate Cloth  6  each Topical Q0600   Chlorhexidine Gluconate Cloth  6 each Topical Q0600   cinacalcet  30 mg Oral Q M,W,F   darbepoetin (ARANESP) injection - DIALYSIS  60 mcg Subcutaneous Q Wed-1800   gabapentin  300 mg Oral QHS   liver  oil-zinc oxide   Topical BID   mupirocin ointment  1 Application Nasal BID   nystatin   Topical TID   pantoprazole  80 mg Oral Daily   PARoxetine  20 mg Oral QHS   sucroferric oxyhydroxide  1,000 mg Oral TID WC     Data Reviewed:   CBG:  Recent Labs  Lab 11/03/23 1645  GLUCAP 117*    SpO2: 98 % O2 Flow Rate (L/min): 2 L/min FiO2 (%): 21 %    Vitals:   11/08/23 1304 11/08/23 2041 11/09/23 0700 11/09/23 0729  BP: 115/62 (!) 123/49 (!) 94/43 (!) 136/59  Pulse: (!) 109 (!) 110 96 93  Resp: 18 16 18 18   Temp: 98.8 F (37.1 C) 98.5 F (36.9 C) 98 F (36.7 C) (!) 97.4 F (36.3 C)  TempSrc:  Oral    SpO2: 90% 92% 100% 98%  Weight:      Height:          Data Reviewed:  Basic Metabolic Panel: Recent Labs  Lab 11/03/23 1714 11/04/23 0246 11/06/23 0520  NA 140 139 135  K 5.1 4.7 3.9  CL 100 102 99  CO2 20* 21* 22  GLUCOSE 123* 148* 131*  BUN 42* 46* 45*  CREATININE 11.47* 11.86* 8.25*  CALCIUM 9.3 9.1 8.8*  MG  --  1.6*  --   PHOS  --  4.1 3.9    CBC: Recent Labs  Lab 11/03/23 1714 11/04/23 0246 11/06/23 0634  WBC 5.9 5.8 9.4  NEUTROABS 4.9  --   --   HGB 9.3* 8.3* 10.3*  HCT 31.7* 27.9* 35.1*  MCV 94.9 94.6 93.9  PLT 82* 67* 70*    LFT Recent Labs  Lab 11/03/23 1714 11/04/23 0246 11/06/23 0520  AST 15 9*  --   ALT 7 8  --   ALKPHOS 104 89  --   BILITOT 1.6* 1.8*  --   PROT 7.1 6.4*  --   ALBUMIN 3.5 3.0* 2.5*     Antibiotics: Anti-infectives (From admission, onward)    Start     Dose/Rate Route Frequency Ordered Stop   11/09/23 1600  ceFEPIme (MAXIPIME) 2 g in sodium chloride 0.9 % 100 mL IVPB        2 g 200 mL/hr over 30 Minutes Intravenous Every M-W-F (Hemodialysis) 11/08/23 0854     11/09/23 1600  vancomycin (VANCOCIN) IVPB 1000 mg/200 mL premix        1,000 mg 200 mL/hr over 60 Minutes Intravenous Every M-W-F (Hemodialysis) 11/08/23 0855     11/08/23 0930  vancomycin (VANCOCIN) IVPB 1000 mg/200 mL premix        1,000 mg 200  mL/hr over 60 Minutes Intravenous  Once 11/08/23 0824 11/08/23 1201   11/08/23 0900  ceFEPIme (MAXIPIME) 2 g in sodium chloride 0.9 % 100 mL IVPB        2 g 200 mL/hr over 30 Minutes Intravenous  Once 11/08/23 0804 11/08/23 1008   11/05/23 1130  piperacillin-tazobactam (ZOSYN) 2.25 g in sodium chloride 0.9 % 50 mL IVPB  Status:  Discontinued        2.25 g 100 mL/hr over 30 Minutes Intravenous Every 8 hours 11/05/23 1054 11/05/23 1127  11/04/23 1200  vancomycin (VANCOCIN) IVPB 1000 mg/200 mL premix  Status:  Discontinued        1,000 mg 200 mL/hr over 60 Minutes Intravenous Every M-W-F (Hemodialysis) 11/04/23 0250 11/05/23 1127   11/04/23 0600  piperacillin-tazobactam (ZOSYN) IVPB 2.25 g  Status:  Discontinued        2.25 g 100 mL/hr over 30 Minutes Intravenous Every 8 hours 11/04/23 0250 11/05/23 1054   11/03/23 2315  vancomycin (VANCOREADY) IVPB 2000 mg/400 mL        2,000 mg 200 mL/hr over 120 Minutes Intravenous  Once 11/03/23 2300 11/04/23 0229   11/03/23 2315  piperacillin-tazobactam (ZOSYN) IVPB 3.375 g        3.375 g 100 mL/hr over 30 Minutes Intravenous  Once 11/03/23 2300 11/04/23 0024        DVT prophylaxis: Eliquis  Code Status: Full code  Family Communication: Discussed with patient's daughter at bedside   CONSULTS nephrology   Subjective   Very drowsy this morning.  Has been getting oxycodone 5 mg every 4 hours as needed   Objective    Physical Examination:  Very somnolent, opens eyes to verbal stimuli S1-S2, regular Skin-right buttock area has marked induration, no erythema noted   Status is: Inpatient:             Martha [name]   [facility] If 7PM-7AM, please contact night-coverage at [email or web address], Office  [phone]   11/09/2023, 7:56 AM  LOS: 6 days

## 2023-11-09 NOTE — Progress Notes (Signed)
 Patient noted to have xeroform to area on her right buttocks this morning,  foul odor noted. Eschar tissue noted to most of wound, notified WOC Heather Bullins, RN , new order placed for Vashe and medihoney with desitin periwound. Patient tolerated wound care with no s/s of pain . Patient has been asleep most of day. Arouses when spoken to , but will only open eyes for a few moments and then go back to sleep.

## 2023-11-09 NOTE — Progress Notes (Signed)
 Patient sleeping most of day , Dr. Alfonse Angle ordered Narcan prn given intravenously , patient able to talk more at this time stating " oh God help me " . Will monitor and avoid narcotics at this time

## 2023-11-09 NOTE — Evaluation (Signed)
 Physical Therapy Evaluation Patient Details Name: Martha [name] MRN: [id number] DOB: [birth year] Today's Date: 11/09/2023  History of Present Illness  In the emergency department, she was febrile with a temperature of 103F, BP was 179/74, O2 sat was 98%, but this decreased to 86% on room air while sleeping, she was provided with supplemental oxygen via [place] at 2 LPM with O2 sats improvement to 94% to 97%.  Other vital signs are within normal range.  Workup in the ED showed normocytic anemia and thrombocytopenia.  BMP was normal except for bicarb 20, blood glucose 123, BUN 42, creatinine 1.47.  Anion gap 20.  Lactic acid 3.8 > 2.4.  Troponin 12 > 14, BNP 1300 (this was 1,369 on 08/18/2023).  TSH 5.193.  Influenza A, B, SARS coronavirus 2, RSV was negative.  CT chest, abdomen and pelvis without contrast was negative for acute intrathoracic or intra-abdominal/pelvic abnormality.  Erosive changes at the pubic symphysis considered to be secondary to inflammatory process osteitis or infection.  Tylenol was given, patient was treated with IV vancomycin and Zosyn.  Reglan was given, Ativan was given, Dilaudid was given.   Clinical Impression  Patient demonstrates slow labored movement for sitting up at bedside, once seated unable to maintain sitting balance with frequent leaning, falling to the left.  Patient unable to attempt sit to stands/transfers and required Max assist to reposition in bed mostly due to lethargy. Patient will benefit from continued skilled physical therapy in hospital and recommended venue below to increase strength, balance, endurance for safe ADLs and gait.          If plan is discharge home, recommend the following: Help with stairs or ramp for entrance;A lot of help with bathing/dressing/bathroom;A lot of help with walking and/or transfers;Assistance with cooking/housework   Can travel by private vehicle   No    Equipment Recommendations None recommended by PT  Recommendations for  Other Services       Functional Status Assessment Patient has had a recent decline in their functional status and demonstrates the ability to make significant improvements in function in a reasonable and predictable amount of time.     Precautions / Restrictions Precautions Precautions: Fall Restrictions Weight Bearing Restrictions Per Provider Order: No      Mobility  Bed Mobility Overal bed mobility: Needs Assistance Bed Mobility: Sidelying to Sit, Sit to Sidelying   Sidelying to sit: Max assist     Sit to sidelying: Max assist General bed mobility comments: slow labored movement with poor sitting balance, frequent falling over to the left    Transfers                        Ambulation/Gait                  Stairs            Wheelchair Mobility     Tilt Bed    Modified Rankin (Stroke Patients Only)       Balance Overall balance assessment: Needs assistance Sitting-balance support: Feet supported, Bilateral upper extremity supported Sitting balance-Leahy Scale: Poor Sitting balance - Comments: seated at EOB Postural control: Left lateral lean                                   Pertinent Vitals/Pain Pain Assessment Pain Assessment: No/denies pain    Home Living Family/patient expects to be discharged  to:: Private residence Living Arrangements: Alone Available Help at Discharge: Personal care attendant Type of Home: Apartment Home Access: Ramped entrance       Home Layout: One level Home Equipment: Shower seat;BSC/3in1;Grab bars - tub/shower;Rolling Environmental consultant (2 wheels) Additional Comments: HD MWF    Prior Function Prior Level of Function : Needs assist       Physical Assist : Mobility (physical);ADLs (physical) Mobility (physical): Bed mobility;Transfers;Gait;Stairs   Mobility Comments: House hold ambulation using RW ADLs Comments: Has home aides 8 hours/day x 7 days/week     Extremity/Trunk Assessment    Upper Extremity Assessment Upper Extremity Assessment: Generalized weakness    Lower Extremity Assessment Lower Extremity Assessment: Generalized weakness    Cervical / Trunk Assessment Cervical / Trunk Assessment: Kyphotic  Communication   Communication Communication: Impaired Factors Affecting Communication: Other (comment) (limited most due to lethargy)    Cognition Arousal: Lethargic Behavior During Therapy: Flat affect   PT - Cognitive impairments: No apparent impairments                       PT - Cognition Comments: possibly lethargic due to pain medication Following commands: Intact       Cueing Cueing Techniques: Verbal cues, Tactile cues     General Comments      Exercises     Assessment/Plan    PT Assessment Patient needs continued PT services  PT Problem List Decreased strength;Decreased activity tolerance;Decreased balance;Decreased mobility       PT Treatment Interventions DME instruction;Gait training;Stair training;Functional mobility training;Therapeutic activities;Therapeutic exercise;Balance training;Patient/family education    PT Goals (Current goals can be found in the Care Plan section)  Acute Rehab PT Goals Patient Stated Goal: return home PT Goal Formulation: With patient/family Time For Goal Achievement: 11/23/23 Potential to Achieve Goals: Good    Frequency Min 3X/week     Co-evaluation               AM-PAC PT "6 Clicks" Mobility  Outcome Measure Help needed turning from your back to your side while in a flat bed without using bedrails?: A Lot Help needed moving from lying on your back to sitting on the side of a flat bed without using bedrails?: A Lot Help needed moving to and from a bed to a chair (including a wheelchair)?: Total Help needed standing up from a chair using your arms (e.g., wheelchair or bedside chair)?: Total Help needed to walk in hospital room?: Total Help needed climbing 3-5 steps with a  railing? : Total 6 Click Score: 8    End of Session   Activity Tolerance: Patient limited by fatigue;Patient limited by lethargy Patient left: in bed;with call bell/phone within reach;with family/visitor present Nurse Communication: Mobility status PT Visit Diagnosis: Unsteadiness on feet (R26.81);Other abnormalities of gait and mobility (R26.89);Muscle weakness (generalized) (M62.81)    Time: 1610-9604 PT Time Calculation (min) (ACUTE ONLY): 20 min   Charges:   PT Evaluation $PT Eval Moderate Complexity: 1 Mod PT Treatments $Therapeutic Activity: 8-22 mins PT General Charges $$ ACUTE PT VISIT: 1 Visit         2:34 PM, 11/09/23 Walton Guppy, MPT Physical Therapist with [facility] [phone] office [phone] mobile phone

## 2023-11-09 NOTE — Plan of Care (Signed)
   Problem: Education: Goal: Knowledge of General Education information will improve Description: Including pain rating scale, medication(s)/side effects and non-pharmacologic comfort measures Outcome: Progressing   Problem: Health Behavior/Discharge Planning: Goal: Ability to manage health-related needs will improve Outcome: Progressing   Problem: Clinical Measurements: Goal: Ability to maintain clinical measurements within normal limits will improve Outcome: Progressing Goal: Will remain free from infection Outcome: Progressing Goal: Diagnostic test results will improve Outcome: Progressing Goal: Respiratory complications will improve Outcome: Progressing Goal: Cardiovascular complication will be avoided Outcome: Progressing   Problem: Activity: Goal: Risk for activity intolerance will decrease Outcome: Progressing   Problem: Nutrition: Goal: Adequate nutrition will be maintained Outcome: Progressing   Problem: Coping: Goal: Level of anxiety will decrease Outcome: Progressing   Problem: Elimination: Goal: Will not experience complications related to bowel motility Outcome: Progressing Goal: Will not experience complications related to urinary retention Outcome: Progressing   Problem: Pain Managment: Goal: General experience of comfort will improve and/or be controlled Outcome: Progressing   Problem: Safety: Goal: Ability to remain free from injury will improve Outcome: Progressing   Problem: Skin Integrity: Goal: Risk for impaired skin integrity will decrease Outcome: Progressing   Problem: Clinical Measurements: Goal: Ability to avoid or minimize complications of infection will improve Outcome: Progressing   Problem: Skin Integrity: Goal: Skin integrity will improve Outcome: Progressing   Problem: Clinical Measurements: Goal: Ability to avoid or minimize complications of infection will improve Outcome: Progressing   Problem: Skin Integrity: Goal: Skin  integrity will improve Outcome: Progressing

## 2023-11-09 NOTE — Plan of Care (Signed)
  Problem: Acute Rehab PT Goals(only PT should resolve) Goal: Pt Will Go Supine/Side To Sit Outcome: Progressing Flowsheets (Taken 11/09/2023 1441) Pt will go Supine/Side to Sit:  with minimal assist  with moderate assist Goal: Patient Will Transfer Sit To/From Stand Outcome: Progressing Flowsheets (Taken 11/09/2023 1441) Patient will transfer sit to/from stand:  with moderate assist  with minimal assist Goal: Pt Will Transfer Bed To Chair/Chair To Bed Outcome: Progressing Flowsheets (Taken 11/09/2023 1441) Pt will Transfer Bed to Chair/Chair to Bed:  with min assist  with mod assist Goal: Pt Will Ambulate Outcome: Progressing Flowsheets (Taken 11/09/2023 1441) Pt will Ambulate:  15 feet  with moderate assist  with rolling walker   2:42 PM, 11/09/23 Walton Guppy, MPT Physical Therapist with [facility] [phone] office [phone] mobile phone

## 2023-11-09 NOTE — Progress Notes (Signed)
   HEMODIALYSIS TREATMENT NOTE:  Treatment initiation was delayed due to need to exchange Sizewise bed after malfunction (unable to raise HOB).    Drowsy throughout session and moaning, "Oh God, help me.  Jesus, help me" repeatedly.  UF was limited by soft blood pressures.   Vancomycin and Cefepime given during last 1.5 hours of therapy.  All blood was returned.  Net UF 1 liter.  Post-HD:  11/09/23 2215  Vitals  Temp 97.7 F (36.5 C)  Temp Source Axillary  BP 123/62  MAP (mmHg) 72  BP Location Right Wrist  BP Method Automatic  Patient Position (if appropriate) Lying  Pulse Rate 70  Pulse Rate Source Monitor  ECG Heart Rate 72  Resp 16  Oxygen Therapy  SpO2 96 %  O2 Device Nasal Cannula  O2 Flow Rate (L/min) 2 L/min  Post Treatment  Dialyzer Clearance Lightly streaked  Hemodialysis Intake (mL) 0 mL  Liters Processed 87.2  Fluid Removed (mL) 1000 mL  Tolerated HD Treatment No (Comment)  Post-Hemodialysis Comments UF was limited by hypotension.  Unable to meet 2-2.5L goal  AVG/AVF Arterial Site Held (minutes) 7 minutes  AVG/AVF Venous Site Held (minutes) 7 minutes  Fistula / Graft Left Upper arm Arteriovenous fistula  Placement Date/Time: 04/26/19 1056   Orientation: Left  Access Location: Upper arm  Access Type: (c) Arteriovenous fistula  Fistula / Graft Assessment Thrill;Bruit;Other (Comment) (ecchymosis)  Status Patent    Waunita Haff, RN AP K[facility]

## 2023-11-10 ENCOUNTER — Inpatient Hospital Stay (HOSPITAL_COMMUNITY)

## 2023-11-10 DIAGNOSIS — Z7189 Other specified counseling: Secondary | ICD-10-CM | POA: Diagnosis not present

## 2023-11-10 DIAGNOSIS — K219 Gastro-esophageal reflux disease without esophagitis: Secondary | ICD-10-CM | POA: Diagnosis not present

## 2023-11-10 DIAGNOSIS — R509 Fever, unspecified: Secondary | ICD-10-CM | POA: Diagnosis not present

## 2023-11-10 DIAGNOSIS — E872 Acidosis, unspecified: Secondary | ICD-10-CM | POA: Diagnosis not present

## 2023-11-10 DIAGNOSIS — N186 End stage renal disease: Secondary | ICD-10-CM | POA: Diagnosis not present

## 2023-11-10 DIAGNOSIS — L03317 Cellulitis of buttock: Secondary | ICD-10-CM | POA: Diagnosis not present

## 2023-11-10 DIAGNOSIS — Z515 Encounter for palliative care: Secondary | ICD-10-CM | POA: Diagnosis not present

## 2023-11-10 LAB — BLOOD GAS, ARTERIAL
Acid-Base Excess: 9.5 mmol/L — ABNORMAL HIGH (ref 0.0–2.0)
Bicarbonate: 33.5 mmol/L — ABNORMAL HIGH (ref 20.0–28.0)
Drawn by: 41977
O2 Saturation: 92.8 %
Patient temperature: 36.8
pCO2 arterial: 42 mmHg (ref 32–48)
pH, Arterial: 7.51 — ABNORMAL HIGH (ref 7.35–7.45)
pO2, Arterial: 59 mmHg — ABNORMAL LOW (ref 83–108)

## 2023-11-10 MED ORDER — DARBEPOETIN ALFA 100 MCG/0.5ML IJ SOSY
100.0000 ug | PREFILLED_SYRINGE | INTRAMUSCULAR | Status: DC
  Administered 2023-11-11: 100 ug via SUBCUTANEOUS
  Filled 2023-11-10: qty 0.5

## 2023-11-10 MED ORDER — NEPRO/CARBSTEADY PO LIQD
237.0000 mL | Freq: Two times a day (BID) | ORAL | Status: DC
  Administered 2023-11-11 – 2023-11-14 (×4): 237 mL via ORAL

## 2023-11-10 MED ORDER — ALPRAZOLAM 0.5 MG PO TABS
1.0000 mg | ORAL_TABLET | Freq: Three times a day (TID) | ORAL | Status: DC | PRN
  Administered 2023-11-11 – 2023-11-17 (×8): 1 mg via ORAL
  Filled 2023-11-10 (×8): qty 2

## 2023-11-10 NOTE — Progress Notes (Signed)
 Palliative: Martha [name] is seen in her room.  She is using a walker to stand up out of the [place] chair for transfer to her bed in order to transport to CT.  She appears chronically ill and frail, morbidly obese.  I believe that she can make her basic needs known.  There is no family at bedside at this time but bedside nursing staff is present attending to needs.  Waiting head CT results.  PMT to follow.   Face-to-face conference with attending, bedside nursing staff, transition of care team related to patient condition needs, goals of care, disposition.  Plan: Martha [name] request full scope/full code, but would not want long-term life support.  Ultimately, her goal is to return to her own home with CAP aid already in place for 4 hours/day 5 days/week.  Continue HD as long as she is able  25 minutes  Martha Milburn, NP Palliative medicine team Team phone [phone]

## 2023-11-10 NOTE — Progress Notes (Signed)
 Mobility Specialist Progress Note:    11/10/23 1200  Mobility  Activity Transferred from chair to bed  Level of Assistance Minimal assist, patient does 75% or more  Assistive Device Front wheel walker  Distance Ambulated (ft) 3 ft  Range of Motion/Exercises Active;All extremities  Activity Response Tolerated fair  Mobility visit 1 Mobility  Mobility Specialist Start Time (ACUTE ONLY) 1145  Mobility Specialist Stop Time (ACUTE ONLY) 1200  Mobility Specialist Time Calculation (min) (ACUTE ONLY) 15 min   Assisted nursing staff with transfer of pt. Required MinA to stand and transfer with RW. Tolerated fair, pt called out for help before and during transfer. Left pt with nursing staff, all needs met.  Glinda Lapping Mobility Specialist Please contact via Special educational needs teacher or  Rehab office at [phone]

## 2023-11-10 NOTE — Plan of Care (Signed)
   Problem: Education: Goal: Knowledge of General Education information will improve Description: Including pain rating scale, medication(s)/side effects and non-pharmacologic comfort measures Outcome: Progressing   Problem: Health Behavior/Discharge Planning: Goal: Ability to manage health-related needs will improve Outcome: Progressing   Problem: Clinical Measurements: Goal: Ability to maintain clinical measurements within normal limits will improve Outcome: Progressing Goal: Will remain free from infection Outcome: Progressing Goal: Diagnostic test results will improve Outcome: Progressing Goal: Respiratory complications will improve Outcome: Progressing Goal: Cardiovascular complication will be avoided Outcome: Progressing   Problem: Activity: Goal: Risk for activity intolerance will decrease Outcome: Progressing   Problem: Nutrition: Goal: Adequate nutrition will be maintained Outcome: Progressing   Problem: Coping: Goal: Level of anxiety will decrease Outcome: Progressing   Problem: Elimination: Goal: Will not experience complications related to bowel motility Outcome: Progressing Goal: Will not experience complications related to urinary retention Outcome: Progressing   Problem: Pain Managment: Goal: General experience of comfort will improve and/or be controlled Outcome: Progressing   Problem: Safety: Goal: Ability to remain free from injury will improve Outcome: Progressing   Problem: Skin Integrity: Goal: Risk for impaired skin integrity will decrease Outcome: Progressing   Problem: Clinical Measurements: Goal: Ability to avoid or minimize complications of infection will improve Outcome: Progressing   Problem: Skin Integrity: Goal: Skin integrity will improve Outcome: Progressing   Problem: Clinical Measurements: Goal: Ability to avoid or minimize complications of infection will improve Outcome: Progressing   Problem: Skin Integrity: Goal: Skin  integrity will improve Outcome: Progressing

## 2023-11-10 NOTE — Progress Notes (Signed)
 Patient given PRN narcan since she was lethargic she is now awake and yelling out, gave PRN tylenol for pain , able to take morning medications and sip some sprite

## 2023-11-10 NOTE — Progress Notes (Signed)
 Patient to CT scan via bed and then transported back safely to room. She still continues to yell out " Oh God, Help me at times".

## 2023-11-10 NOTE — Progress Notes (Signed)
 Patient is no longer screaming in pain, she is asleep , she was able to tolerate PO oral medications this shift and consumed 50% of lunch, she did require max assist and needing to be fed for lunch ., patient states she usually feeds self at home however was unable to hold the fork without dropping it . She is alert and oriented, able to make needs known.

## 2023-11-10 NOTE — Plan of Care (Signed)
   Problem: Education: Goal: Knowledge of General Education information will improve Description Including pain rating scale, medication(s)/side effects and non-pharmacologic comfort measures Outcome: Progressing   Problem: Health Behavior/Discharge Planning: Goal: Ability to manage health-related needs will improve Outcome: Progressing

## 2023-11-10 NOTE — Progress Notes (Signed)
 Went to place patient on her home CPAP machine. Her machine seems to be leaking excessively from the water chamber. RT unable to safely place patient on her machine due to it continuing to cut off. Will place patient on hospital machine with her home settings and mask.

## 2023-11-10 NOTE — Progress Notes (Signed)
 RN called to inform RT that patient had pulled CPAP off already and was not going to put it back on. Unit at bedside if she changes her mind.

## 2023-11-10 NOTE — Progress Notes (Signed)
 [facility] Progress Note  Name: Martha [name] MRN: [id number] DOB: [birth year]  Chief Complaint:  Fever and generalized aches   Subjective:  Patient seen and examined bedside. S/p HD yesterday, net uf 1L w/ vanc and cefepime. She reports that she didn't tolerated HD yesterday, had "pain all over". Inquiring about how long she is going to be here for.  Denies any chest pain, SOB.  --------------  Background on consult:  Martha [name] is a/an 58 y.o. female with a past medical history of ESRD who presents with generalized aches.   Patient was unable to give history.  Unclear if this was because she was altered or if because she was in pain and had difficulty giving details.  She often responded I do not know.  I did discuss her care with Martha [name] who is listed as her daughter but is actually not related and is a Development worker, international aid that takes care of the patient. The patient was recently hospitalized and discharged on 3/28.  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  She was having abdominal pain, nausea, vomiting and it was felt the symptoms were related to the splenic infarct.  Martha [name] states that the patient has not improved since discharge.  She continues to have poor p.o. intake, nausea, vomiting, abdominal pain.  The patient also endorses generalized bodyaches.  The patient states she is unsure when she last had dialysis but Martha [name] said it was Friday; other providers have documented last dialysis is being 3/26.  Martha [name] states that the patient has not been going to dialysis regularly.  She often skips and is noncompliant.  Patient has described the pain in her abdomen as sharp and intermittent.  In the emergency department she was found to be febrile with a temperature of 103 Fahrenheit.  She had some hypoxia that improved with supplemental oxygen.  Lactic acid was initially elevated and then improved.  She was negative for RSV, flu, COVID.  CT of the abdomen  did demonstrate erosive changes at the pubic symphysis.   Intake/Output Summary (Last 24 hours) at 11/10/2023 0731 Last data filed at 11/10/2023 0500 Gross per 24 hour  Intake 400 ml  Output 1000 ml  Net -600 ml    Vitals:  Vitals:   11/09/23 2130 11/09/23 2145 11/09/23 2215 11/10/23 0416  BP: (!) 120/57 (!) 118/59 123/62 (!) 117/55  Pulse: 70 79 70 93  Resp: 16 14 16 20   Temp:   97.7 F (36.5 C) 98.3 F (36.8 C)  TempSrc:   Axillary   SpO2:   96% 94%  Weight:      Height:         Physical Exam:  General: NAD/ill appearing, laying flat in bed Lungs clear to auscultation bilaterally normal work of breathing Heart  S1S2 no rub Abdomen soft nontender obese habitus Extremities: no sig edema b/l LEs Neuro: awake, following commands Dialysis access: LUE AVF +b/t  Medications reviewed   Labs:     Latest Ref Rng & Units 11/09/2023    6:35 PM 11/06/2023    5:20 AM 11/04/2023    2:46 AM  BMP  Glucose 70 - 99 mg/dL 409  811  914   BUN 6 - 20 mg/dL 54  45  46   Creatinine 0.44 - 1.00 mg/dL 7.82  9.56  21.30   Sodium 135 - 145 mmol/L 132  135  139   Potassium 3.5 - 5.1 mmol/L 3.8  3.9  4.7  Chloride 98 - 111 mmol/L 93  99  102   CO2 22 - 32 mmol/L 25  22  21    Calcium 8.9 - 10.3 mg/dL 9.4  8.8  9.1    Outpatient dialysis prescription:  [facility] MWF EDW 120.5kg  4hrs AVF BFR 400/DFR 500 Heparin 1000 bolus and 800/hr Venofer 50 weekly Mircera 200 q2wks Sensipar 30 qtx, calcitriol 2mcg qtx   Assessment/Plan:   # ESRD: - HD per MWF schedule    # Volume/ hypertension: Hypertensive and volume overloaded initially--improved. Significant body edema on initial CT. UF as tolerated, may need edw adjustment on d/c   # Anemia of Chronic Kidney Disease: - No IV iron for now given infection.  - Aranesp 60 mcg every Wednesday---increasing to 100mcg    # Secondary Hyperparathyroidism/Hyperphosphatemia: Ca and phos acceptable. On sensipar and calcitriol   #Buttock  cellulitis: On abx per primary team. Vanc+cefepime w/ HD   # Uncontrolled type 2 diabetes with hyperglycemia: mgmt per primary team    #GOC:  note patient missing dialysis routinely as an outpatient- often because she is in pain and generally doesn't feel well. Palliative care has seen: full scope of care  Disposition per primary team   Martha Donalds, MD 11/10/2023 7:31 AM

## 2023-11-10 NOTE — Progress Notes (Addendum)
 [facility]  PROGRESS NOTE  Martha [name] BJY:[id number] DOB: 1965/09/05 DOA: 11/03/2023 PCP: Carmel Sacramento, NP   Brief HPI:   58 y.o. female with medical history significant of  ESRD on HD (MWF) COPD, hypertension, NASH, AOCD, left eye blindness, type 2 diabetes mellitus with retinopathy, RLS, CHF who presents to the emergency department due to lower abdominal pain, nausea, NBNB vomiting.  Abdominal pain was sharp in nature, it was intermittent with no known alleviating/aggravating factors.  Last dialysis was on 10/21/2023 due to feeling unwell  BMP was normal except for bicarb 20, blood glucose 123, BUN 42, creatinine 1.47.  Anion gap 20.  Lactic acid 3.8 > 2.4.  Troponin 12 > 14, BNP 1300 (this was 1,369 on 08/18/2023).  TSH 5.193.  Influenza A, B, SARS coronavirus 2, RSV was negative. CT chest, abdomen and pelvis without contrast was negative for acute intrathoracic or intra-abdominal/pelvic abnormality.  Erosive changes at the pubic symphysis considered to be secondary to inflammatory process osteitis or infection.   Assessment/Plan:   Buttock cellulitis - Initially presented with fever, started empirically on vancomycin and Zosyn - Fever had resolved so antibiotics were discontinued - Started having drainage from right buttock, general surgery was consulted for possible abscess - CT of the pelvis showed no drainable abscess but showed cellulitis -Restarted vancomycin and cefepime -Wound care RN consulted - Hopefully can be discharged home in next 1 to 2 days on p.o. antibiotics; if continues to do well  Acute encephalopathy/hypersomnolence - Likely in setting of opioid overdose -ABG showed pCO2 42;, CT head pending - Resolved after getting IV Narcan - Will discontinue oxycodone at this time - Will continue with only Tylenol as needed for now   Pubic symphysis inflammation - CT pelvis shows erosive changes at the pubic symphysis likely from inflammatory process versus osteitis  versus infection -Consulted orthopedics, no further recommendations per orthopedics. 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 Arrie Aran) was consulted for maintenance dialysis in the morning per EDP -Patient has been missing hemodialysis due to pain, nephrology recommending palliative care consult for goals of care - Palliative care consulted, continue dialysis as outpatient   Lactic acidosis Elevated anion gap Lactic acid 3.8 > 2.4, anion gap 20 This is possibly related to several missed dialysis sessions Nephrology consulted for maintenance dialysis  Hypertension -Blood pressure has been elevated - hydralazine as needed   Obesity class III (BMI of 40.88) Patient was counseled about the cardiovascular and metabolic risk of morbid obesity. Patient was counseled for diet control, exercise regimen and weight loss.    History of calciphylaxis Continue sensipar as well as Hectorol    Thrombocytopenia Platelets 70, continue to monitor platelet levels -Likely due to splenic sequestration  Splenic infarct -Continue Eliquis   OSA Continue CPAP.     Restless leg syndrome Continue gabapentin.    GERD Continue Protonix   Depression, anxiety, bipolar disorder Continue buspar and paroxetine.     Mixed hyperlipidemia Continue statin   Blindness Patient is blind in both eyes with the left palpebral irritation/chronic injury due to rubbing [name].  - Started on triple antibiotic ophthalmic ointment twice daily   Medications     apixaban  5 mg Oral BID   atorvastatin  20 mg Oral Daily   bacitracin-polymyxin b   Left Eye BID   busPIRone  7.5 mg Oral BID   calcitRIOL  2 mcg Oral Q M,W,F   Chlorhexidine Gluconate Cloth  6  each Topical Q0600   Chlorhexidine Gluconate Cloth  6 each Topical Q0600   cinacalcet  30 mg Oral Q M,W,F   [START ON 11/11/2023] darbepoetin (ARANESP) injection - DIALYSIS  100 mcg Subcutaneous Q Wed-1800   gabapentin  300 mg Oral QHS   leptospermum manuka honey  1 Application Topical Daily   liver oil-zinc oxide   Topical BID   mupirocin ointment  1 Application Nasal BID   nystatin   Topical TID   pantoprazole  80 mg Oral Daily   PARoxetine  20 mg Oral QHS   sucroferric oxyhydroxide  1,000 mg Oral TID WC     Data Reviewed:   CBG:  Recent Labs  Lab 11/03/23 1645  GLUCAP 117*    SpO2: 94 % O2 Flow Rate (L/min): 2 L/min FiO2 (%): 21 %    Vitals:   11/09/23 2130 11/09/23 2145 11/09/23 2215 11/10/23 0416  BP: (!) 120/57 (!) 118/59 123/62 (!) 117/55  Pulse: 70 79 70 93  Resp: 16 14 16 20   Temp:   97.7 F (36.5 C) 98.3 F (36.8 C)  TempSrc:   Axillary   SpO2:   96% 94%  Weight:      Height:          Data Reviewed:  Basic Metabolic Panel: Recent Labs  Lab 11/03/23 1714 11/04/23 0246 11/06/23 0520 11/09/23 1835  NA 140 139 135 132*  K 5.1 4.7 3.9 3.8  CL 100 102 99 93*  CO2 20* 21* 22 25  GLUCOSE 123* 148* 131* 154*  BUN 42* 46* 45* 54*  CREATININE 11.47* 11.86* 8.25* 7.96*  CALCIUM 9.3 9.1 8.8* 9.4  MG  --  1.6*  --   --   PHOS  --  4.1 3.9 4.2    CBC: Recent Labs  Lab 11/03/23 1714 11/04/23 0246 11/06/23 0634 11/09/23 1835  WBC 5.9 5.8 9.4 18.3*  NEUTROABS 4.9  --   --   --   HGB 9.3* 8.3* 10.3* 8.9*  HCT 31.7* 27.9* 35.1* 29.4*  MCV 94.9 94.6 93.9 90.7  PLT 82* 67* 70* 112*    LFT Recent Labs  Lab 11/03/23 1714 11/04/23 0246 11/06/23 0520 11/09/23 1835  AST 15 9*  --   --   ALT 7 8  --   --   ALKPHOS 104 89  --   --   BILITOT 1.6* 1.8*  --   --   PROT 7.1 6.4*  --   --   ALBUMIN 3.5 3.0* 2.5* 2.1*     Antibiotics: Anti-infectives (From admission, onward)    Start     Dose/Rate Route Frequency Ordered Stop   11/09/23 1600   ceFEPIme (MAXIPIME) 2 g in sodium chloride 0.9 % 100 mL IVPB        2 g 200 mL/hr over 30 Minutes Intravenous Every M-W-F (Hemodialysis) 11/08/23 0854     11/09/23 1600  vancomycin (VANCOCIN) IVPB 1000 mg/200 mL premix        1,000 mg 200 mL/hr over 60 Minutes Intravenous Every M-W-F (Hemodialysis) 11/08/23 0855     11/08/23 0930  vancomycin (VANCOCIN) IVPB 1000 mg/200 mL premix        1,000 mg 200 mL/hr over 60 Minutes Intravenous  Once 11/08/23 0824 11/08/23  1201   11/08/23 0900  ceFEPIme (MAXIPIME) 2 g in sodium chloride 0.9 % 100 mL IVPB        2 g 200 mL/hr over 30 Minutes Intravenous  Once 11/08/23 0804 11/08/23 1008   11/05/23 1130  piperacillin-tazobactam (ZOSYN) 2.25 g in sodium chloride 0.9 % 50 mL IVPB  Status:  Discontinued        2.25 g 100 mL/hr over 30 Minutes Intravenous Every 8 hours 11/05/23 1054 11/05/23 1127   11/04/23 1200  vancomycin (VANCOCIN) IVPB 1000 mg/200 mL premix  Status:  Discontinued        1,000 mg 200 mL/hr over 60 Minutes Intravenous Every M-W-F (Hemodialysis) 11/04/23 0250 11/05/23 1127   11/04/23 0600  piperacillin-tazobactam (ZOSYN) IVPB 2.25 g  Status:  Discontinued        2.25 g 100 mL/hr over 30 Minutes Intravenous Every 8 hours 11/04/23 0250 11/05/23 1054   11/03/23 2315  vancomycin (VANCOREADY) IVPB 2000 mg/400 mL        2,000 mg 200 mL/hr over 120 Minutes Intravenous  Once 11/03/23 2300 11/04/23 0229   11/03/23 2315  piperacillin-tazobactam (ZOSYN) IVPB 3.375 g        3.375 g 100 mL/hr over 30 Minutes Intravenous  Once 11/03/23 2300 11/04/23 0024        DVT prophylaxis: Eliquis  Code Status: Full code  Family Communication: Discussed with patient'[name] daughter at bedside   CONSULTS nephrology   Subjective   Patient was very somnolent this morning, became more alert after getting 1 dose of Narcan.  Started yelling with pain.   Objective    Physical Examination:  Somnolent but arousable S1-S2, regular Lungs clear to  auscultation bilaterally   Status is: Inpatient:      Pressure Injury 11/09/23 Thigh Posterior;Proximal;Right Unstageable - Full thickness tissue loss in which the base of the injury is covered by slough (yellow, tan, gray, green or brown) and/or eschar (tan, brown or black) in the wound bed. eschar present (Active)  11/09/23 1129  Location: Thigh  Location Orientation: Posterior;Proximal;Right  Staging: Unstageable - Full thickness tissue loss in which the base of the injury is covered by slough (yellow, tan, gray, green or brown) and/or eschar (tan, brown or black) in the wound bed.  Wound Description (Comments): eschar present  Present on Admission:         Martha [name] Martha [name]   [facility] If 7PM-7AM, please contact night-coverage at [email or web address], Office  [phone]   11/10/2023, 8:44 AM  LOS: 7 days

## 2023-11-10 NOTE — TOC Progression Note (Signed)
 Transition of Care [facility]) - Progression Note    Patient Details  Name: LALITA EBEL MRN: [id number] Date of Birth: June 05, 1966  Transition of Care [facility]) CM/SW Contact  Linnea Richards, LCSW Phone Number: 11/10/2023, 3:21 PM  Clinical Narrative:     TOC following. PT recommending rehab at dc. Asked MD for OT eval to see if pt appropriate for CIR due to insurance benefits.  Will follow.  Expected Discharge Plan: Home/Self Care Barriers to Discharge: Continued Medical Work up  Expected Discharge Plan and Services In-house Referral: Clinical Social Work   Post Acute Care Choice: Horticulturist, commercial, Home Health, Dialysis Living arrangements for the past 2 months: Apartment                 DME Arranged: 3-N-1, Tub bench                     Social Determinants of Health (SDOH) Interventions SDOH Screenings   Food Insecurity: No Food Insecurity (11/04/2023)  Housing: Low Risk  (11/04/2023)  Transportation Needs: No Transportation Needs (11/04/2023)  Utilities: Not At Risk (11/04/2023)  Depression (PHQ2-9): Low Risk  (03/11/2023)  Social Connections: Unknown (10/20/2023)  Tobacco Use: High Risk (11/05/2023)    Readmission Risk Interventions    11/05/2023    1:24 PM 11/04/2023    8:33 AM 09/22/2023   10:43 AM  Readmission Risk Prevention Plan  Transportation Screening Complete Complete Complete  Medication Review Oceanographer) Complete Complete Complete  HRI or Home Care Consult Complete Complete Complete  SW Recovery Care/Counseling Consult Complete Complete Complete  Palliative Care Screening Not Applicable Not Applicable Not Applicable  Skilled Nursing Facility Not Applicable Not Applicable Not Applicable

## 2023-11-10 NOTE — Progress Notes (Signed)
 Physical Therapy Treatment Patient Details Name: Martha [name] MRN: [id number] DOB: [birth year] Today's Date: 11/10/2023   History of Present Illness In the emergency department, she was febrile with a temperature of 103F, BP was 179/74, O2 sat was 98%, but this decreased to 86% on room air while sleeping, she was provided with supplemental oxygen via [place] at 2 LPM with O2 sats improvement to 94% to 97%.  Other vital signs are within normal range.  Workup in the ED showed normocytic anemia and thrombocytopenia.  BMP was normal except for bicarb 20, blood glucose 123, BUN 42, creatinine 1.47.  Anion gap 20.  Lactic acid 3.8 > 2.4.  Troponin 12 > 14, BNP 1300 (this was 1,369 on 08/18/2023).  TSH 5.193.  Influenza A, B, SARS coronavirus 2, RSV was negative.  CT chest, abdomen and pelvis without contrast was negative for acute intrathoracic or intra-abdominal/pelvic abnormality.  Erosive changes at the pubic symphysis considered to be secondary to inflammatory process osteitis or infection.  Tylenol was given, patient was treated with IV vancomycin and Zosyn.  Reglan was given, Ativan was given, Dilaudid was given.    PT Comments  Patient presents alert and able to follow directions with frequent verbal/tactile cueing. Patient demonstrates improvement for sitting up at bedside using bed rail with HOB partially raised, able to take a few side steps at bedside with slow labored movement and limited mostly due to c/o fatigue with buckling of knees. Patient tolerated sitting up in chair after therapy - nursing staff aware. Patient will benefit from continued skilled physical therapy in hospital and recommended venue below to increase strength, balance, endurance for safe ADLs and gait.      If plan is discharge home, recommend the following: Help with stairs or ramp for entrance;A lot of help with bathing/dressing/bathroom;A lot of help with walking and/or transfers;Assistance with cooking/housework   Can travel by  private vehicle     No  Equipment Recommendations  None recommended by PT    Recommendations for Other Services       Precautions / Restrictions Precautions Precautions: Fall Recall of Precautions/Restrictions: Intact Restrictions Weight Bearing Restrictions Per Provider Order: No     Mobility  Bed Mobility Overal bed mobility: Needs Assistance Bed Mobility: Rolling, Sidelying to Sit Rolling: Min assist Sidelying to sit: Mod assist       General bed mobility comments: increased time, labored movement    Transfers Overall transfer level: Needs assistance Equipment used: Rolling walker (2 wheels) Transfers: Sit to/from Stand, Bed to chair/wheelchair/BSC Sit to Stand: Mod assist   Step pivot transfers: Mod assist       General transfer comment: very unsteady on feet with buckling of knees due to weakness    Ambulation/Gait Ambulation/Gait assistance: Mod assist, Max assist Gait Distance (Feet): 5 Feet Assistive device: Rolling walker (2 wheels) Gait Pattern/deviations: Decreased step length - right, Decreased step length - left, Decreased stride length, Knees buckling Gait velocity: slow     General Gait Details: limited to a few slow labored unsteady side steps with buckling of knees due to weakness   Stairs             Wheelchair Mobility     Tilt Bed    Modified Rankin (Stroke Patients Only)       Balance Overall balance assessment: Needs assistance Sitting-balance support: Feet supported, No upper extremity supported Sitting balance-Leahy Scale: Fair Sitting balance - Comments: seated at EOB   Standing balance support: Reliant on  assistive device for balance, During functional activity, Bilateral upper extremity supported Standing balance-Leahy Scale: Poor Standing balance comment: using RW                            Communication Communication Communication: No apparent difficulties  Cognition Arousal: Alert Behavior  During Therapy: WFL for tasks assessed/performed   PT - Cognitive impairments: No apparent impairments                         Following commands: Impaired Following commands impaired: Follows one step commands with increased time    Cueing Cueing Techniques: Verbal cues, Tactile cues  Exercises      General Comments        Pertinent Vitals/Pain Pain Assessment Pain Assessment: Faces Faces Pain Scale: Hurts whole lot Pain Location: stomach Pain Descriptors / Indicators: Grimacing, Moaning, Sharp, Discomfort Pain Intervention(s): Limited activity within patient's tolerance, Monitored during session, Repositioned    Home Living                          Prior Function            PT Goals (current goals can now be found in the care plan section) Acute Rehab PT Goals Patient Stated Goal: return home PT Goal Formulation: With patient/family Time For Goal Achievement: 11/23/23 Potential to Achieve Goals: Good Progress towards PT goals: Progressing toward goals    Frequency    Min 3X/week      PT Plan      Co-evaluation              AM-PAC PT "6 Clicks" Mobility   Outcome Measure  Help needed turning from your back to your side while in a flat bed without using bedrails?: A Little Help needed moving from lying on your back to sitting on the side of a flat bed without using bedrails?: A Lot Help needed moving to and from a bed to a chair (including a wheelchair)?: A Lot Help needed standing up from a chair using your arms (e.g., wheelchair or bedside chair)?: A Lot Help needed to walk in hospital room?: A Lot Help needed climbing 3-5 steps with a railing? : Total 6 Click Score: 12    End of Session Equipment Utilized During Treatment: Oxygen Activity Tolerance: Patient tolerated treatment well;Patient limited by fatigue Patient left: in chair;with call bell/phone within reach;with chair alarm set Nurse Communication: Mobility  status PT Visit Diagnosis: Unsteadiness on feet (R26.81);Other abnormalities of gait and mobility (R26.89);Muscle weakness (generalized) (M62.81)     Time: 0454-0981 PT Time Calculation (min) (ACUTE ONLY): 28 min  Charges:    $Therapeutic Activity: 23-37 mins PT General Charges $$ ACUTE PT VISIT: 1 Visit                     2:15 PM, 11/10/23 Walton Guppy, MPT Physical Therapist with [facility] 336 [phone] office [phone] mobile phone

## 2023-11-11 ENCOUNTER — Encounter (HOSPITAL_COMMUNITY): Payer: Self-pay | Admitting: Internal Medicine

## 2023-11-11 DIAGNOSIS — N186 End stage renal disease: Secondary | ICD-10-CM | POA: Diagnosis not present

## 2023-11-11 DIAGNOSIS — I4891 Unspecified atrial fibrillation: Secondary | ICD-10-CM

## 2023-11-11 DIAGNOSIS — Z992 Dependence on renal dialysis: Secondary | ICD-10-CM | POA: Diagnosis not present

## 2023-11-11 DIAGNOSIS — R509 Fever, unspecified: Secondary | ICD-10-CM | POA: Diagnosis not present

## 2023-11-11 DIAGNOSIS — L03317 Cellulitis of buttock: Secondary | ICD-10-CM

## 2023-11-11 DIAGNOSIS — I952 Hypotension due to drugs: Secondary | ICD-10-CM

## 2023-11-11 DIAGNOSIS — R1084 Generalized abdominal pain: Secondary | ICD-10-CM

## 2023-11-11 LAB — CBC
HCT: 29.4 % — ABNORMAL LOW (ref 36.0–46.0)
Hemoglobin: 9.1 g/dL — ABNORMAL LOW (ref 12.0–15.0)
MCH: 27.9 pg (ref 26.0–34.0)
MCHC: 31 g/dL (ref 30.0–36.0)
MCV: 90.2 fL (ref 80.0–100.0)
Platelets: 110 10*3/uL — ABNORMAL LOW (ref 150–400)
RBC: 3.26 MIL/uL — ABNORMAL LOW (ref 3.87–5.11)
RDW: 18.3 % — ABNORMAL HIGH (ref 11.5–15.5)
WBC: 22.6 10*3/uL — ABNORMAL HIGH (ref 4.0–10.5)
nRBC: 0.1 % (ref 0.0–0.2)

## 2023-11-11 LAB — RENAL FUNCTION PANEL
Albumin: 1.9 g/dL — ABNORMAL LOW (ref 3.5–5.0)
Anion gap: 18 — ABNORMAL HIGH (ref 5–15)
BUN: 43 mg/dL — ABNORMAL HIGH (ref 6–20)
CO2: 25 mmol/L (ref 22–32)
Calcium: 9.2 mg/dL (ref 8.9–10.3)
Chloride: 92 mmol/L — ABNORMAL LOW (ref 98–111)
Creatinine, Ser: 6.12 mg/dL — ABNORMAL HIGH (ref 0.44–1.00)
GFR, Estimated: 8 mL/min — ABNORMAL LOW (ref >60)
Glucose, Bld: 175 mg/dL — ABNORMAL HIGH (ref 70–99)
Phosphorus: 2.9 mg/dL (ref 2.5–4.6)
Potassium: 3.4 mmol/L — ABNORMAL LOW (ref 3.5–5.1)
Sodium: 135 mmol/L (ref 135–145)

## 2023-11-11 LAB — PROCALCITONIN: Procalcitonin: 6.07 ng/mL

## 2023-11-11 LAB — HEPARIN LEVEL (UNFRACTIONATED): Heparin Unfractionated: >1.1 [IU]/mL — ABNORMAL HIGH (ref 0.30–0.70)

## 2023-11-11 LAB — LACTIC ACID, PLASMA
Lactic Acid, Venous: 1 mmol/L (ref 0.5–1.9)
Lactic Acid, Venous: 1.1 mmol/L (ref 0.5–1.9)

## 2023-11-11 LAB — TSH: TSH: 4.577 u[IU]/mL — ABNORMAL HIGH (ref 0.350–4.500)

## 2023-11-11 LAB — MRSA NEXT GEN BY PCR, NASAL: MRSA by PCR Next Gen: DETECTED — AB

## 2023-11-11 LAB — APTT: aPTT: 39 s — ABNORMAL HIGH (ref 24–36)

## 2023-11-11 LAB — TROPONIN I (HIGH SENSITIVITY)
Troponin I (High Sensitivity): 330 ng/L (ref <18)
Troponin I (High Sensitivity): 322 ng/L (ref <18)

## 2023-11-11 MED ORDER — AMIODARONE HCL IN DEXTROSE 360-4.14 MG/200ML-% IV SOLN
30.0000 mg/h | INTRAVENOUS | Status: DC
  Administered 2023-11-12: 30 mg/h via INTRAVENOUS
  Filled 2023-11-11 (×3): qty 200

## 2023-11-11 MED ORDER — ALBUMIN HUMAN 25 % IV SOLN
25.0000 g | Freq: Once | INTRAVENOUS | Status: AC
Start: 2023-11-11 — End: 2023-11-11
  Administered 2023-11-11: 25 g via INTRAVENOUS
  Filled 2023-11-11: qty 100

## 2023-11-11 MED ORDER — AMIODARONE HCL IN DEXTROSE 360-4.14 MG/200ML-% IV SOLN
60.0000 mg/h | INTRAVENOUS | Status: AC
  Administered 2023-11-11 (×2): 60 mg/h via INTRAVENOUS
  Filled 2023-11-11: qty 200

## 2023-11-11 MED ORDER — ALBUMIN HUMAN 25 % IV SOLN: 12.5000 g | Freq: Once | INTRAVENOUS | Status: DC

## 2023-11-11 MED ORDER — MUPIROCIN 2 % EX OINT
TOPICAL_OINTMENT | Freq: Two times a day (BID) | CUTANEOUS | Status: DC
  Administered 2023-11-12 – 2023-11-14 (×3): 1 via NASAL
  Filled 2023-11-11: qty 22

## 2023-11-11 MED ORDER — HEPARIN (PORCINE) 25000 UT/250ML-% IV SOLN
2150.0000 [IU]/h | INTRAVENOUS | Status: DC
  Administered 2023-11-11: 1250 [IU]/h via INTRAVENOUS
  Administered 2023-11-12: 1500 [IU]/h via INTRAVENOUS
  Administered 2023-11-14 – 2023-11-16 (×6): 2150 [IU]/h via INTRAVENOUS
  Filled 2023-11-11 (×11): qty 250

## 2023-11-11 MED ORDER — DILTIAZEM LOAD VIA INFUSION
10.0000 mg | Freq: Once | INTRAVENOUS | Status: AC
  Administered 2023-11-11: 10 mg via INTRAVENOUS
  Filled 2023-11-11: qty 10

## 2023-11-11 MED ORDER — DILTIAZEM HCL-DEXTROSE 125-5 MG/125ML-% IV SOLN (PREMIX)
5.0000 mg/h | INTRAVENOUS | Status: DC
  Administered 2023-11-11: 5 mg/h via INTRAVENOUS
  Filled 2023-11-11 (×2): qty 125

## 2023-11-11 NOTE — Procedures (Signed)
 HD Note:  Some information was entered later than the data was gathered due to patient care needs. The stated time with the data is accurate.  Brought patient in bed to unit.   Alert and oriented.  Slow to answer questions unless she is interested in them.  Will easily speak of family.  Informed consent signed and in chart.   Patient heart rythmn changed when setting up for treatment.  Dr. Simpson Dubs notified and new orders given with Dr. Colin Dawley taking over as he is on campus.  EKG completed   Treatment was interrupted to move patient to the ICU med to allow new medications to be monitored during treatment.  Access used: Left upper arm fistula Access issues: None   Maniya Donovan L. Alva Jewels, RN Kidney Dialysis Unit.

## 2023-11-11 NOTE — Consult Note (Signed)
 WOC team consulted for R inner thigh wound.  Patient has R buttock/ischial wound that has been evaluated by surgery and WOC with orders in place.  Secure chat with bedside nurse.  If new wound found please upload photo for consultation.    Thank you,    Ronni Colace MSN, RN-BC, [employer] [phone]

## 2023-11-11 NOTE — Progress Notes (Signed)
 PHARMACY - ANTICOAGULATION CONSULT NOTE  Pharmacy Consult for Heparin Indication: atrial fibrillation  Allergies  Allergen Reactions   Codeine Nausea And Vomiting   Tape Other (See Comments) and Rash    Pull skin off.  Paper tape is ok    Patient Measurements: Height: 5\' 4"  (162.6 cm) Weight: 117 kg (257 lb 15 oz) (Bed weight, patient unable to stand) IBW/kg (Calculated) : 54.7 HEPARIN DW (KG): 89.4  Vital Signs: Temp: 98.4 F (36.9 C) (04/16 1301) BP: 84/32 (04/16 1624) Pulse Rate: 117 (04/16 1624)  Labs: Recent Labs    11/09/23 1835 11/11/23 1320 11/11/23 1441  HGB 8.9* 9.1*  --   HCT 29.4* 29.4*  --   PLT 112* 110*  --   CREATININE 7.96* 6.12*  --   TROPONINIHS  --   --  330*    Estimated Creatinine Clearance: 13.1 mL/min (A) (by C-G formula based on SCr of 6.12 mg/dL (H)).   Medical History: Past Medical History:  Diagnosis Date   Acid reflux    takes Tums   Anemia    Arthritis    Bipolar 1 disorder (HCC)    Blindness of left eye    left eye removed   Carpal tunnel syndrome, bilateral    Cervical radiculopathy    CHF (congestive heart failure) (HCC)    Chronic kidney disease    Stage 5- 01/25/17   Constipation    COPD (chronic obstructive pulmonary disease) (HCC) 2014   bronchitis   Degenerative disc disease, thoracic    Depression    Diabetes mellitus    Type II   Diabetic retinopathy (HCC)    Dyspnea    when walking   End stage kidney disease (HCC)    M, W, F [facility]   Gout    Hypertension    Non-alcoholic cirrhosis (HCC)    Noncompliance with medication regimen    Noncompliance with medication regimen    Obesity (BMI 30-39.9)    OSA (obstructive sleep apnea)    cpap   Panic attack    RLS (restless legs syndrome)     Medications:  Medications Prior to Admission  Medication Sig Dispense Refill Last Dose/Taking   ADVAIR DISKUS 250-50 MCG/ACT AEPB Inhale 1 puff into the lungs 2 (two) times daily.   Past Week   albuterol  (PROVENTIL) (2.5 MG/3ML) 0.083% nebulizer solution Take 3 mLs (2.5 mg total) by nebulization every 12 (twelve) hours as needed for wheezing or shortness of breath. 75 mL 0 Past Week   ALPRAZolam (XANAX) 1 MG tablet Take 1 tablet (1 mg total) by mouth 4 (four) times daily as needed for anxiety. (Patient taking differently: Take 1 mg by mouth 4 (four) times daily.) 9 tablet 0 11/01/2023   apixaban (ELIQUIS) 5 MG TABS tablet Take 2 tablets (10 mg total) by mouth 2 (two) times daily for 7 days, THEN 1 tablet (5 mg total) 2 (two) times daily for 23 days. 74 tablet 0 10/30/2023   Artificial Tear Ointment (DRY EYES OP) Place 1 drop into the right eye 2 (two) times daily.   11/01/2023   atorvastatin (LIPITOR) 20 MG tablet Take 20 mg by mouth daily.   11/01/2023   atropine (ISOPTO ATROPINE) 1 % ophthalmic solution Place 1 drop into the left eye 3 (three) times daily.   11/01/2023   brimonidine (ALPHAGAN) 0.2 % ophthalmic solution Place 1 drop into the left eye 3 (three) times daily.   11/01/2023   busPIRone (BUSPAR) 7.5 MG  tablet Take 1 tablet (7.5 mg total) by mouth 2 (two) times daily. 7 tablet 0 11/01/2023   cinacalcet (SENSIPAR) 60 MG tablet Take 60 mg by mouth daily.   11/01/2023   colchicine 0.6 MG tablet TAKE 1 TABLET(0.6 MG) BY MOUTH DAILY 30 tablet 0 11/01/2023   doxercalciferol (HECTOROL) 4 MCG/2ML injection Inject 0.5 mLs (1 mcg total) into the vein every Monday, Wednesday, and Friday with hemodialysis. 2 mL  10/30/2023   gabapentin (NEURONTIN) 100 MG capsule Take 1 capsule (100 mg total) by mouth at bedtime. (Patient taking differently: Take 300 mg by mouth at bedtime.) 7 capsule 0 11/01/2023   HYDROcodone-acetaminophen (NORCO) 10-325 MG tablet Take 1 tablet by mouth 4 (four) times daily.   11/01/2023   insulin glargine (LANTUS SOLOSTAR) 100 UNIT/ML Solostar Pen Inject 30 Units into the skin daily.   11/01/2023   latanoprost (XALATAN) 0.005 % ophthalmic solution Place 1 drop into the left eye 2 (two) times daily.   11/01/2023    omeprazole (PRILOSEC) 40 MG capsule Take 40 mg by mouth daily.   11/01/2023   ondansetron (ZOFRAN) 4 MG tablet TAKE (1) TABLET THREE TIMES DAILY AS NEEDED FOR NAUSEA.   11/01/2023   PARoxetine (PAXIL) 20 MG tablet Take 1 tablet (20 mg total) by mouth every evening. This is to prevent panic attacks (Patient taking differently: Take 20 mg by mouth at bedtime.) 7 tablet 0 11/01/2023   VELPHORO 500 MG chewable tablet Chew 1,000 mg by mouth 3 (three) times daily.   11/01/2023   Blood Glucose Monitoring Suppl (FORA V30A BLOOD GLUCOSE SYSTEM) DEVI       FORACARE PREMIUM V10 TEST test strip        Assessment: Patient presented with buttock cellulitis. ON chronic anticoagulation with eliquis for afib. Patient having rhythm changes and MD concerned. Diltiazem infusion started, cardiology to evaluate. MD asked pharmacy to transition to heparin for now. Will follow APTT and HL until correlates. Last dose of eliquis 4/16 at 0830  Goal of Therapy:  Heparin level 0.3-0.7 units/ml aPTT 66-102 seconds Monitor platelets by anticoagulation protocol: Yes   Plan:  Start heparin infusion at 1250  units/hr at 2130 Check APTT and anti-Xa level in 6-8 hours and daily while on heparin Continue to monitor H&H and platelets  Lora Robinsons, BS Pharm D, BCPS Clinical Pharmacist 11/11/2023,4:28 PM

## 2023-11-11 NOTE — Evaluation (Signed)
 Occupational Therapy Evaluation Patient Details Name: Martha [name] MRN: [id number] DOB: 02-12-1966 Today's Date: 11/11/2023   History of Present Illness   In the emergency department, she was febrile with a temperature of 103F, BP was 179/74, O2 sat was 98%, but this decreased to 86% on room air while sleeping, she was provided with supplemental oxygen via [place] at 2 LPM with O2 sats improvement to 94% to 97%.  Other vital signs are within normal range.  Workup in the ED showed normocytic anemia and thrombocytopenia.  BMP was normal except for bicarb 20, blood glucose 123, BUN 42, creatinine 1.47.  Anion gap 20.  Lactic acid 3.8 > 2.4.  Troponin 12 > 14, BNP 1300 (this was 1,369 on 08/18/2023).  TSH 5.193.  Influenza A, B, SARS coronavirus 2, RSV was negative.  CT chest, abdomen and pelvis without contrast was negative for acute intrathoracic or intra-abdominal/pelvic abnormality.  Erosive changes at the pubic symphysis considered to be secondary to inflammatory process osteitis or infection.  Tylenol was given, patient was treated with IV vancomycin and Zosyn.  Reglan was given, Ativan was given, Dilaudid was given.     Clinical Impressions Pt agreeable to OT and PT co-evaluation/treatment. Pt is assisted for bathing, dressing, and toileting at baseline. Pt demonstrates need for mod A to sit at EOB and min to mod A to transfer to chair with RW. Pt demonstrates B UE weakness limiting active range of motion at the shoulder. Pt anxious and moaning and calling out often during the session. Able to follow commands with verbal and tactile cuing. Pt left in the chair with call bell within reach and chair alarm set. Pt will benefit from continued OT in the hospital and recommended venue below to increase strength, balance, and endurance for safe ADL's.        If plan is discharge home, recommend the following:   A lot of help with walking and/or transfers;A lot of help with  bathing/dressing/bathroom;Assistance with cooking/housework;Assist for transportation;Help with stairs or ramp for entrance;Direct supervision/assist for medications management     Functional Status Assessment   Patient has had a recent decline in their functional status and demonstrates the ability to make significant improvements in function in a reasonable and predictable amount of time.     Equipment Recommendations   None recommended by OT     Recommendations for Other Services   Rehab consult     Precautions/Restrictions   Precautions Precautions: Fall Recall of Precautions/Restrictions: Intact Restrictions Weight Bearing Restrictions Per Provider Order: No     Mobility Bed Mobility Overal bed mobility: Needs Assistance Bed Mobility: Supine to Sit     Supine to sit: Mod assist     General bed mobility comments: labored movement; increased time; assist to push to sit from flat bed.    Transfers Overall transfer level: Needs assistance Equipment used: Rolling walker (2 wheels) Transfers: Sit to/from Stand, Bed to chair/wheelchair/BSC Sit to Stand: Min assist, Mod assist     Step pivot transfers: Min assist, Mod assist     General transfer comment: per PT; see PT note      Balance Overall balance assessment: Needs assistance Sitting-balance support: No upper extremity supported, Feet supported Sitting balance-Leahy Scale: Fair Sitting balance - Comments: seated at EOB   Standing balance support: Bilateral upper extremity supported, During functional activity, Reliant on assistive device for balance Standing balance-Leahy Scale: Poor Standing balance comment: using RW  ADL either performed or assessed with clinical judgement   ADL Overall ADL's : Needs assistance/impaired Eating/Feeding: Modified independent;Sitting   Grooming: Set up;Minimal assistance;Sitting   Upper Body Bathing: Minimal  assistance;Moderate assistance;Sitting   Lower Body Bathing: Maximal assistance;Total assistance;Sitting/lateral leans   Upper Body Dressing : Minimal assistance;Moderate assistance;Sitting   Lower Body Dressing: Maximal assistance;Total assistance;Sitting/lateral leans   Toilet Transfer: Moderate assistance;Stand-pivot;Rolling walker (2 wheels) Toilet Transfer Details (indicate cue type and reason): Simulated EOB to chair with RW Toileting- Clothing Manipulation and Hygiene: Total assistance;Maximal assistance;Bed level   Tub/ Shower Transfer: Moderate assistance;Stand-pivot;Rolling walker (2 wheels)   Functional mobility during ADLs: Moderate assistance;Maximal assistance;Rolling walker (2 wheels) (per clinical judgement; pt unable to take steps today)       Vision Baseline Vision/History: 2 Legally blind (blind L eye) Ability to See in Adequate Light: 2 Moderately impaired Patient Visual Report: No change from baseline Additional Comments: impaired at baseline     Perception Perception: Not tested       Praxis Praxis: Not tested       Pertinent Vitals/Pain Pain Assessment Pain Assessment: Faces Faces Pain Scale: Hurts whole lot Pain Location: general; moaning Pain Descriptors / Indicators: Grimacing, Moaning Pain Intervention(s): Limited activity within patient's tolerance, Monitored during session, Repositioned     Extremity/Trunk Assessment Upper Extremity Assessment Upper Extremity Assessment: Generalized weakness (B UE limited to 3-/5 for shoulder flexion with full P/ROM of the same motion. Indicates limitation due to weakness.)   Lower Extremity Assessment Lower Extremity Assessment: Defer to PT evaluation   Cervical / Trunk Assessment Cervical / Trunk Assessment: Kyphotic   Communication Communication Communication: No apparent difficulties   Cognition Arousal: Alert Behavior During Therapy: Anxious Cognition: No family/caregiver present to determine  baseline                               Following commands: Intact Following commands impaired: Only follows one step commands consistently     Cueing  General Comments   Cueing Techniques: Verbal cues                 Home Living Family/patient expects to be discharged to:: Private residence Living Arrangements: Alone Available Help at Discharge: Personal care attendant Type of Home: Apartment Home Access: Ramped entrance     Home Layout: One level     Bathroom Shower/Tub: Producer, television/film/video: Standard Bathroom Accessibility: Yes   Home Equipment: Shower seat;BSC/3in1;Grab bars - tub/shower;Rolling Environmental consultant (2 wheels)   Additional Comments: HD MWF (per chart)      Prior Functioning/Environment Prior Level of Function : Needs assist       Physical Assist : ADLs (physical)   ADLs (physical): Bathing;Dressing;Toileting;IADLs Mobility Comments: House hold ambulation using RW ADLs Comments: Has home aides 8 hours/day x 7 days/week; pt able to groom and feed only. Assited for all other ADL's and IADL's.    OT Problem List: Decreased strength;Decreased range of motion;Decreased activity tolerance;Impaired balance (sitting and/or standing);Obesity;Pain   OT Treatment/Interventions: Self-care/ADL training;Therapeutic exercise;Therapeutic activities;Patient/family education;Balance training      OT Goals(Current goals can be found in the care plan section)   Acute Rehab OT Goals Patient Stated Goal: return home OT Goal Formulation: With patient Time For Goal Achievement: 11/25/23 Potential to Achieve Goals: Fair   OT Frequency:  Min 2X/week    Co-evaluation PT/OT/SLP Co-Evaluation/Treatment: Yes Reason for Co-Treatment: To address functional/ADL transfers   OT goals addressed  during session: ADL's and self-care                       End of Session Equipment Utilized During Treatment: Rolling walker (2  wheels);Oxygen  Activity Tolerance: Patient tolerated treatment well Patient left: in chair;with call bell/phone within reach;with chair alarm set  OT Visit Diagnosis: Unsteadiness on feet (R26.81);Other abnormalities of gait and mobility (R26.89);Muscle weakness (generalized) (M62.81)                Time: 9604-5409 OT Time Calculation (min): 25 min Charges:  OT General Charges $OT Visit: 1 Visit OT Evaluation $OT Eval Low Complexity: 1 Low  Tiwanna Tuch OT, MOT  [name] 11/11/2023, 9:41 AM

## 2023-11-11 NOTE — Consult Note (Signed)
 Cardiology Consultation:   Patient ID: Martha [name]; [id number]; 1966-03-15   Admit date: 11/03/2023 Date of Consult: 11/11/2023  Primary Care Provider: Carmel Sacramento, NP Primary Cardiologist: New to [facility]  History of Present Illness:   [name] is a medically complex 58 y.o. female currently admitted to the hospital with fever in association with buttock cellulitis, encephalopathy, lactic acidosis, calciphylaxis, and nausea/emesis.  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  I reviewed the hospitalist note from earlier this morning as well as nephrology note from today.  Cardiology consulted by Dr. Marland Mcalpine late this afternoon for assistance with management of atrial fibrillation that was documented earlier today.  Based on limited information, my understanding is that the patient was found to be in atrial fibrillation today by ECG, she was not on cardiac monitor previously. Per review of her vital signs, it looks like she was tachycardic as of yesterday suggesting potential onset of arrhythmia at that time.  No prior history of atrial fibrillation.  Her most recent ECG was from April 8 showing sinus rhythm.  She was initially started on IV Cardizem by hospitalist team, developed relative hypotension however during hemodialysis session resulting in discontinuation of IV Cardizem and temporary pause in hemodialysis, also given IV albumin.  On my evaluation in the ICU, the patient's systolic blood pressure was in the 90s, she was being given back fluid with ongoing hemodialysis.  She did not report any sense of chest pain or palpitations, mainly complains of buttock pain in the setting of cellulitis.  Echocardiogram from March indicated LVEF 60 to 65%.  ROS:  Pertinent review in history of present illness.  History is somewhat limited by encephalopathy.  Past Medical History:  Diagnosis Date   Acid reflux     Anemia    Arthritis    Bipolar 1 disorder (HCC)    Blindness of left eye    left eye removed   Carpal tunnel syndrome, bilateral    Cervical radiculopathy    CHF (congestive heart failure) (HCC)    Constipation    COPD (chronic obstructive pulmonary disease) (HCC) 2014   Degenerative disc disease, thoracic    Depression    Diabetes mellitus    Type II   Diabetic retinopathy (HCC)    End stage kidney disease (HCC)    M, W, F [facility]   Gout    Hypertension    Non-alcoholic cirrhosis (HCC)    Noncompliance with medication regimen    Obesity (BMI 30-39.9)    OSA (obstructive sleep apnea)    cpap   Panic attack    RLS (restless legs syndrome)     Past Surgical History:  Procedure Laterality Date   AORTIC ARCH ANGIOGRAPHY N/A 09/11/2020   Procedure: AORTIC ARCH ANGIOGRAPHY;  Surgeon: Nada Libman, MD;  [facility]: [facility];  Service: Cardiovascular;  Laterality: N/A;   AV FISTULA PLACEMENT Right 01/27/2017   Procedure: ARTERIOVENOUS (AV) FISTULA CREATION-RIGHT ARM;  Surgeon: Sherren Kerns, MD;  Location: [facility] OR;  Service: Vascular;  Laterality: Right;   AV FISTULA PLACEMENT Left 08/03/2018   Procedure: LEFT ARTERIOVENOUS (AV) FISTULA CREATION;  Surgeon: Sherren Kerns, MD;  Location: [facility] OR;  Service: Vascular;  Laterality: Left;   BASCILIC VEIN TRANSPOSITION Left 04/26/2019   Procedure: SECOND STAGE BASILIC VEIN TRANSPOSITION LEFT ARM;  Surgeon: Sherren Kerns, MD;  Location: [facility] OR;  Service: Vascular;  Laterality: Left;  BIOPSY N/A 05/17/2013   Procedure: BIOPSY;  Surgeon: Alyce Jubilee, MD;  Location: [facility] ORS;  Service: Endoscopy;  Laterality: N/A;   CARPAL TUNNEL RELEASE Left 11/25/2018   Procedure: LEFT CARPAL TUNNEL RELEASE;  Surgeon: Florida Hurter, MD;  Location: [facility] OR;  Service: Orthopedics;  Laterality: Left;   CATARACT EXTRACTION Right    CHOLECYSTECTOMY N/A 04/27/2014   Procedure: LAPAROSCOPIC CHOLECYSTECTOMY;  Surgeon: Myrl Askew,  MD;  Location: [facility] ORS;  Service: General;  Laterality: N/A;   COLONOSCOPY  06/06/2010   EXB:MWUXLK bleeding secondary to internal hemorrhoids but incomplete evaluation secondary to poor right colon prep/small rectal and sigmoid colon polyps (hyperplastic). PROPOFOL   COLONOSCOPY  May 2013   Dr. Gilliam/[facility]: 5 mm a descending colon polyp, hyperplastic. Adequate bowel prep.   ENDOMETRIAL ABLATION     ENUCLEATION Left 02/04/2018   ENUCLEATION WITH PLACEMENT OF IMPLANT LEFT EYE   ENUCLEATION Left 02/04/2018   Procedure: ENUCLEATION WITH PLACEMENT OF IMPLANT LEFT EYE;  Surgeon: Karan Osgood, MD;  Location: [facility] OR;  Service: Ophthalmology;  Laterality: Left;   ESOPHAGOGASTRODUODENOSCOPY  06/06/2010   GMW:NUUVOZD erythema and edema of body of stomach, with sessile polypoid lesions. bx benign. no h.pylori   ESOPHAGOGASTRODUODENOSCOPY (EGD) WITH PROPOFOL N/A 05/17/2013   Dr. Nolene Baumgarten: normal esophagus, moderate nodular gastritis, negative path, empiric Savary dilation   EYE SURGERY  07/2017   sx for glaucoma   FLEXIBLE SIGMOIDOSCOPY N/A 05/17/2013   Dr. Nolene Baumgarten: moderate sized internal hemorrhoids   HEMORRHOID BANDING N/A 05/17/2013   Procedure: HEMORRHOID BANDING;  Surgeon: Alyce Jubilee, MD;  Location: [facility];  Service: Endoscopy;  Laterality: N/A;  2 bands placed   INCISION AND DRAINAGE ABSCESS Left 10/11/2013   Procedure: INCISION AND DRAINAGE AND DEBRIDEMENT LEFT BREAST  ABSCESS;  Surgeon: Myrl Askew, MD;  Location: [facility]S;  Service: General;  Laterality: Left;   INSERTION OF DIALYSIS CATHETER N/A 06/08/2018   Procedure: INSERTION OF Right internal jugular TUNNELED  DIALYSIS CATHETER;  Surgeon: Dannis Dy, MD;  Location: [facility] OR;  Service: Vascular;  Laterality: N/A;   IRRIGATION AND DEBRIDEMENT ABSCESS Right 06/01/2013   Procedure: INCISION AND DRAINAGE AND DEBRIDEMENT ABSCESS RIGHT BREAST;  Surgeon: Myrl Askew, MD;  Location: [facility];  Service: General;  Laterality:  Right;   LEFT EYE REMOVED Left 01/2018   [facility] [address].   LIGATION OF ARTERIOVENOUS  FISTULA Right 06/08/2018   Procedure: LIGATION OF ARTERIOVENOUS  FISTULA RIGHT ARM;  Surgeon: Dannis Dy, MD;  Location: [facility] OR;  Service: Vascular;  Laterality: Right;   ORIF ANKLE FRACTURE Right 12/17/2018   Procedure: OPEN REDUCTION INTERNAL FIXATION (ORIF) ANKLE FRACTURE;  Surgeon: Dot Gazella, DPM;  Location: [facility] OR;  Service: Podiatry;  Laterality: Right;   REVISON OF ARTERIOVENOUS FISTULA Left 09/26/2020   Procedure: LEFT ARTERIOVENOUS FISTULA  DISTAL REVASCULARIZATION AND LIGATION ( DRIL ) USING GREATER SAPHENOUS VEIN;  Surgeon: Adine Hoof, MD;  Location: [facility] OR;  Service: Vascular;  Laterality: Left;   SAVORY DILATION N/A 05/17/2013   Procedure: SAVORY DILATION;  Surgeon: Alyce Jubilee, MD;  Location: [facility];  Service: Endoscopy;  Laterality: N/A;  14/15/16   SKIN FULL THICKNESS GRAFT Left 05/05/2018   Procedure: ABDOMINAL DERMIS FAT SKIN GRAFT FULL THICKNESS LEFT EYE;  Surgeon: Karan Osgood, MD;  Location: [facility] OR;  Service: Ophthalmology;  Laterality: Left;   TUBAL LIGATION     VEIN HARVEST Left 09/26/2020   Procedure: LEFT LEG  GREATER SAPHENOUS VEIN HARVEST;  Surgeon: Adine Hoof, MD;  Location: [facility] OR;  Service: Vascular;  Laterality: Left;     Inpatient Medications: Scheduled Meds:  atorvastatin  20 mg Oral Daily   bacitracin-polymyxin b   Left Eye BID   busPIRone  7.5 mg Oral BID   calcitRIOL  2 mcg Oral Q M,W,F   Chlorhexidine Gluconate Cloth  6 each Topical Q0600   cinacalcet  30 mg Oral Q M,W,F   darbepoetin (ARANESP) injection - DIALYSIS  100 mcg Subcutaneous Q Wed-1800   feeding supplement (NEPRO CARB STEADY)  237 mL Oral BID BM   leptospermum manuka honey  1 Application Topical Daily   liver oil-zinc oxide   Topical BID   nystatin   Topical TID   pantoprazole  80 mg Oral Daily   PARoxetine  20 mg Oral QHS    sucroferric oxyhydroxide  1,000 mg Oral TID WC   Continuous Infusions:  albumin human     ceFEPime (MAXIPIME) IV 2 g (11/09/23 2131)   diltiazem (CARDIZEM) infusion 5 mg/hr (11/11/23 1412)   heparin     vancomycin 1,000 mg (11/09/23 2024)   PRN Meds: acetaminophen **OR** acetaminophen, ALPRAZolam, loperamide, naLOXone (NARCAN)  injection, ondansetron **OR** ondansetron (ZOFRAN) IV  Allergies:    Allergies  Allergen Reactions   Codeine Nausea And Vomiting   Tape Other (See Comments) and Rash    Pull skin off.  Paper tape is ok    Social History:   Social History   Tobacco Use   Smoking status: Some Days    Current packs/day: 0.00    Average packs/day: 1.5 packs/day for 25.0 years (37.5 ttl pk-yrs)    Types: Cigarettes, E-cigarettes    Start date: 10/23/1982    Last attempt to quit: 10/23/2007    Years since quitting: 16.0   Smokeless tobacco: Never   Tobacco comments:    I vape once in a while.  Substance Use Topics   Alcohol use: No    Family History:   The patient's She was adopted. Family history is unknown by patient.  Physical Exam/Data:   Vitals:   11/11/23 1616 11/11/23 1620 11/11/23 1624 11/11/23 1631  BP:  (!) 74/22 (!) 84/32 (!) 93/32  Pulse: (!) 109 (!) 101 (!) 117 (!) 113  Resp: 19 (!) 23 (!) 22 (!) 21  Temp:      TempSrc:      SpO2: 96% 95% 94% 93%  Weight:      Height:        Intake/Output Summary (Last 24 hours) at 11/11/2023 1649 Last data filed at 11/11/2023 0900 Gross per 24 hour  Intake 460 ml  Output --  Net 460 ml   Filed Weights   11/06/23 1025 11/06/23 1445 11/11/23 1301  Weight: 121.7 kg 119.8 kg 117 kg   Body mass index is 44.27 kg/m.   Gen: Chronically ill-appearing woman, in Trendelenburg. HEENT: Conjunctiva and lids normal. Neck: Supple, no elevated JVP or carotid bruits. Lungs: Clear to auscultation anteriorly, nonlabored breathing at rest. Cardiac: Irregularly irregular with soft systolic murmur and no gallop. Abdomen:  Soft, nontender, bowel sounds present. Extremities: No pitting edema. Skin: Warm and dry. Musculoskeletal: No kyphosis. Neuropsychiatric: Alert and oriented x1.  EKG:  An ECG dated 11/11/2023 was personally reviewed today and demonstrated:  Atrial fibrillation at 119 bpm, nonspecific ST-T changes.  Telemetry:  I personally reviewed telemetry which shows atrial fibrillation.  Relevant CV Studies:  Echocardiogram 10/21/2023:  1. Left  ventricular ejection fraction, by estimation, is 60 to 65%. The  left ventricle has normal function. The left ventricle has no regional  wall motion abnormalities. Left ventricular diastolic parameters are  indeterminate. Elevated left ventricular  end-diastolic pressure.   2. Right ventricular systolic function is normal. The right ventricular  size is mildly enlarged.   3. The mitral valve is grossly normal. Trivial mitral valve  regurgitation. No evidence of mitral stenosis.   4. The aortic valve was not well visualized. Aortic valve regurgitation  is not visualized. No aortic stenosis is present.   Laboratory Data:  Chemistry Recent Labs  Lab 11/06/23 0520 11/09/23 1835 11/11/23 1320  NA 135 132* 135  K 3.9 3.8 3.4*  CL 99 93* 92*  CO2 22 25 25   GLUCOSE 131* 154* 175*  BUN 45* 54* 43*  CREATININE 8.25* 7.96* 6.12*  CALCIUM 8.8* 9.4 9.2  GFRNONAA 5* 6* 8*  ANIONGAP 14 14 18*    Recent Labs  Lab 11/06/23 0520 11/09/23 1835 11/11/23 1320  ALBUMIN 2.5* 2.1* 1.9*   Hematology Recent Labs  Lab 11/06/23 0634 11/09/23 1835 11/11/23 1320  WBC 9.4 18.3* 22.6*  RBC 3.74* 3.24* 3.26*  HGB 10.3* 8.9* 9.1*  HCT 35.1* 29.4* 29.4*  MCV 93.9 90.7 90.2  MCH 27.5 27.5 27.9  MCHC 29.3* 30.3 31.0  RDW 18.1* 18.5* 18.3*  PLT 70* 112* 110*   Cardiac Enzymes Recent Labs  Lab 10/20/23 2132 11/03/23 1714 11/03/23 1921 11/11/23 1441  TROPONINIHS 12 12 14  330*    Radiology/Studies:  CT HEAD WO CONTRAST ( ) Result Date:  11/10/2023 CLINICAL DATA:  Mental status change, unknown cause. EXAM: CT HEAD WITHOUT CONTRAST TECHNIQUE: Contiguous axial images were obtained from the base of the skull through the vertex without intravenous contrast. RADIATION DOSE REDUCTION: This exam was performed according to the departmental dose-optimization program which includes automated exposure control, adjustment of the mA and/or kV according to patient size and/or use of iterative reconstruction technique. COMPARISON:  CT orbits October 04, 2019.  CT head Nov 29, 2018. FINDINGS: Brain: No evidence of acute infarction, hemorrhage, hydrocephalus, extra-axial collection or mass lesion/mass effect. Vascular: No hyperdense vessel.  Calcific atherosclerosis. Skull: No acute fracture. Sinuses/Orbits: Stable chronic postoperative changes of the globes. Clear sinuses. Remote right medial orbital wall fracture. IMPRESSION: No evidence of acute intracranial abnormality. Electronically Signed   By: Feliberto Harts M.D.   On: 11/10/2023 19:14    Assessment and Plan:   1.  Recent onset atrial fibrillation with RVR, absolute duration uncertain but potentially within the last 24 hours based on review of vital signs.  ECG confirming rhythm was done today and her last tracing on April 8 showed normal sinus rhythm.  She does have a history of splenic infarct so the likelihood of paroxysmal atrial fibrillation is quite high.  Developed hypotension today on IV diltiazem and during hemodialysis.  CHA2DS2-VASc score is 3-4.  LVEF 60 to 65% and RV contraction normal by recent echocardiogram.  Left atrium normal in size.  2.  Buttock cellulitis currently on IV antibiotics per primary team.  CT also shows erosive changes of the pubic symphysis.  3.  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  4.  History of splenic infarct on Eliquis as an outpatient.  I discussed with hemodialysis nurse and also ICU nurse.  IV Cardizem has  already been discontinued and plan is to keep her relatively even during hemodialysis session.  It looks like IV heparin  has been ordered by primary team, she does need anticoagulation given history of splenic infarct and newly documented atrial fibrillation.  This can be converted back to Eliquis when able.  Suggest starting IV amiodarone without bolus once her blood pressure has stabilized (systolic over 100).  She may ultimately convert with this intervention, although ongoing physiologic stressors make risk of arrhythmia high.  Our service will continue to follow with you.  For questions or updates, please contact [facility] Please consult [email or web address] for contact info under   Signed, Teddie Favre, MD  11/11/2023 4:49 PM

## 2023-11-11 NOTE — Progress Notes (Signed)
 Physical Therapy Treatment Patient Details Name: Martha [name] MRN: [id number] DOB: 1966/01/30 Today's Date: 11/11/2023   History of Present Illness In the emergency department, she was febrile with a temperature of 103F, BP was 179/74, O2 sat was 98%, but this decreased to 86% on room air while sleeping, she was provided with supplemental oxygen via [place] at 2 LPM with O2 sats improvement to 94% to 97%.  Other vital signs are within normal range.  Workup in the ED showed normocytic anemia and thrombocytopenia.  BMP was normal except for bicarb 20, blood glucose 123, BUN 42, creatinine 1.47.  Anion gap 20.  Lactic acid 3.8 > 2.4.  Troponin 12 > 14, BNP 1300 (this was 1,369 on 08/18/2023).  TSH 5.193.  Influenza A, B, SARS coronavirus 2, RSV was negative.  CT chest, abdomen and pelvis without contrast was negative for acute intrathoracic or intra-abdominal/pelvic abnormality.  Erosive changes at the pubic symphysis considered to be secondary to inflammatory process osteitis or infection.  Tylenol was given, patient was treated with IV vancomycin and Zosyn.  Reglan was given, Ativan was given, Dilaudid was given.    PT Comments  Patient demonstrates slightly increased strength for sitting up at bedside with [facility] flat, but had to use bed rail, able to take a few side steps, steps forward/backwards before having to sit due to c/o fatigue and BLE weakness.  Patient tolerated sitting up in chair after therapy - RN aware. Patient will benefit from continued skilled physical therapy in hospital and recommended venue below to increase strength, balance, endurance for safe ADLs and gait.     If plan is discharge home, recommend the following: Help with stairs or ramp for entrance;A lot of help with bathing/dressing/bathroom;A lot of help with walking and/or transfers;Assistance with cooking/housework   Can travel by private vehicle     No  Equipment Recommendations  None recommended by PT    Recommendations for  Other Services       Precautions / Restrictions Precautions Precautions: Fall Recall of Precautions/Restrictions: Intact Restrictions Weight Bearing Restrictions Per Provider Order: No     Mobility  Bed Mobility Overal bed mobility: Needs Assistance Bed Mobility: Supine to Sit     Supine to sit: Mod assist     General bed mobility comments: increased time, labored movement, had to use bed rails with HOB flat    Transfers Overall transfer level: Needs assistance Equipment used: Rolling walker (2 wheels) Transfers: Sit to/from Stand, Bed to chair/wheelchair/BSC Sit to Stand: Min assist, Mod assist   Step pivot transfers: Min assist, Mod assist       General transfer comment: unsteady labored movement    Ambulation/Gait Ambulation/Gait assistance: Mod assist Gait Distance (Feet): 8 Feet Assistive device: Rolling walker (2 wheels) Gait Pattern/deviations: Decreased step length - right, Decreased step length - left, Decreased stride length, Knees buckling Gait velocity: slow     General Gait Details: limited to a few side steps, steps forward/backwards before having to sit due to c/o fatigue, poor standing balance using RW   Stairs             Wheelchair Mobility     Tilt Bed    Modified Rankin (Stroke Patients Only)       Balance Overall balance assessment: Needs assistance Sitting-balance support: Feet supported, No upper extremity supported Sitting balance-Leahy Scale: Fair Sitting balance - Comments: fair/good seated at EOB   Standing balance support: Reliant on assistive device for balance, During functional activity,  Bilateral upper extremity supported Standing balance-Leahy Scale: Poor Standing balance comment: using RW                            Communication Communication Communication: No apparent difficulties  Cognition Arousal: Alert Behavior During Therapy: Anxious   PT - Cognitive impairments: No apparent  impairments                         Following commands: Intact Following commands impaired: Only follows one step commands consistently    Cueing Cueing Techniques: Verbal cues, Tactile cues  Exercises      General Comments        Pertinent Vitals/Pain Pain Assessment Pain Assessment: Faces Faces Pain Scale: Hurts whole lot Pain Location: general; moaning, stomach Pain Descriptors / Indicators: Grimacing, Moaning, Discomfort Pain Intervention(s): Limited activity within patient's tolerance, Monitored during session, Repositioned    Home Living Family/patient expects to be discharged to:: Private residence Living Arrangements: Alone Available Help at Discharge: Personal care attendant Type of Home: Apartment Home Access: Ramped entrance       Home Layout: One level Home Equipment: Shower seat;BSC/3in1;Grab bars - tub/shower;Rolling Environmental consultant (2 wheels) Additional Comments: HD MWF (per chart)    Prior Function            PT Goals (current goals can now be found in the care plan section) Acute Rehab PT Goals Patient Stated Goal: return home PT Goal Formulation: With patient/family Time For Goal Achievement: 11/23/23 Potential to Achieve Goals: Good Progress towards PT goals: Progressing toward goals    Frequency    Min 3X/week      PT Plan      Co-evaluation PT/OT/SLP Co-Evaluation/Treatment: Yes Reason for Co-Treatment: To address functional/ADL transfers PT goals addressed during session: Mobility/safety with mobility;Balance;Proper use of DME OT goals addressed during session: ADL's and self-care      AM-PAC PT "6 Clicks" Mobility   Outcome Measure  Help needed turning from your back to your side while in a flat bed without using bedrails?: A Little Help needed moving from lying on your back to sitting on the side of a flat bed without using bedrails?: A Little Help needed moving to and from a bed to a chair (including a wheelchair)?: A  Lot Help needed standing up from a chair using your arms (e.g., wheelchair or bedside chair)?: A Lot Help needed to walk in hospital room?: A Lot Help needed climbing 3-5 steps with a railing? : Total 6 Click Score: 13    End of Session Equipment Utilized During Treatment: Oxygen Activity Tolerance: Patient tolerated treatment well;Patient limited by fatigue Patient left: in chair;with call bell/phone within reach;with chair alarm set Nurse Communication: Mobility status PT Visit Diagnosis: Unsteadiness on feet (R26.81);Other abnormalities of gait and mobility (R26.89);Muscle weakness (generalized) (M62.81)     Time: 1610-9604 PT Time Calculation (min) (ACUTE ONLY): 22 min  Charges:    $Therapeutic Activity: 8-22 mins PT General Charges $$ ACUTE PT VISIT: 1 Visit                     11:20 AM, 11/11/23 Ocie Bob, MPT Physical Therapist with [facility] 336 [phone] office [phone] mobile phone

## 2023-11-11 NOTE — Progress Notes (Signed)
 [facility] Progress Note  Name: Martha [name] MRN: [id number] DOB: 1966/04/04  Chief Complaint:  Fever and generalized aches   Subjective:  Patient seen in room.  Working with PT- looks like she's having great difficulty standing up.  ? If will need SNF  --------------  Background on consult:  Martha [name] is a/an 58 y.o. female with a past medical history of ESRD who presents with generalized aches.   Patient was unable to give history.  Unclear if this was because she was altered or if because she was in pain and had difficulty giving details.  She often responded I do not know.  I did discuss her care with Aram Beecham who is listed as her daughter but is actually not related and is a Development worker, international aid that takes care of the patient. The patient was recently hospitalized and discharged on 3/28.  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  She was having abdominal pain, nausea, vomiting and it was felt the symptoms were related to the splenic infarct.  Aram Beecham states that the patient has not improved since discharge.  She continues to have poor p.o. intake, nausea, vomiting, abdominal pain.  The patient also endorses generalized bodyaches.  The patient states she is unsure when she last had dialysis but Aram Beecham said it was Friday; other providers have documented last dialysis is being 3/26.  Aram Beecham states that the patient has not been going to dialysis regularly.  She often skips and is noncompliant.  Patient has described the pain in her abdomen as sharp and intermittent.  In the emergency department she was found to be febrile with a temperature of 103 Fahrenheit.  She had some hypoxia that improved with supplemental oxygen.  Lactic acid was initially elevated and then improved.  She was negative for RSV, flu, COVID.  CT of the abdomen did demonstrate erosive changes at the pubic symphysis.   Intake/Output Summary (Last 24 hours) at 11/11/2023 0921 Last data  filed at 11/11/2023 0500 Gross per 24 hour  Intake 540 ml  Output --  Net 540 ml    Vitals:  Vitals:   11/10/23 0416 11/10/23 1233 11/10/23 2009 11/11/23 0354  BP: (!) 117/55 131/76 (!) 140/62 121/72  Pulse: 93 100 88 (!) 110  Resp: 20 19 18 20   Temp: 98.3 F (36.8 C) 97.9 F (36.6 C) 98.2 F (36.8 C) 97.8 F (36.6 C)  TempSrc:    Oral  SpO2: 94% 91% 95% 92%  Weight:      Height:         Physical Exam:  General: NAD/ill appearing, laying flat in bed Lungs clear to auscultation bilaterally normal work of breathing Heart  S1S2 no rub Abdomen soft nontender obese habitus Extremities: no sig edema b/l LEs Neuro: awake, following commands Dialysis access: LUE AVF +b/t  Medications reviewed   Labs:     Latest Ref Rng & Units 11/09/2023    6:35 PM 11/06/2023    5:20 AM 11/04/2023    2:46 AM  BMP  Glucose 70 - 99 mg/dL 409  811  914   BUN 6 - 20 mg/dL 54  45  46   Creatinine 0.44 - 1.00 mg/dL 7.82  9.56  21.30   Sodium 135 - 145 mmol/L 132  135  139   Potassium 3.5 - 5.1 mmol/L 3.8  3.9  4.7   Chloride 98 - 111 mmol/L 93  99  102   CO2 22 -  32 mmol/L 25  22  21    Calcium 8.9 - 10.3 mg/dL 9.4  8.8  9.1    Outpatient dialysis prescription:  [facility] MWF EDW 120.5kg  4hrs AVF BFR 400/DFR 500 Heparin 1000 bolus and 800/hr Venofer 50 weekly Mircera 200 q2wks Sensipar 30 qtx, calcitriol 2mcg qtx   Assessment/Plan:   # ESRD: - HD per MWF schedule.  Next HD today   # Volume/ hypertension: Hypertensive and volume overloaded initially--improved. Significant body edema on initial CT. UF as tolerated, may need edw adjustment on d/c   # Anemia of Chronic Kidney Disease: - No IV iron for now given infection.  - Aranesp 60 mcg every Wednesday---increasing to 100mcg    # Secondary Hyperparathyroidism/Hyperphosphatemia: Ca and phos acceptable. On sensipar and calcitriol   #Buttock cellulitis: On abx per primary team. Vanc+cefepime w/ HD   # Uncontrolled type 2  diabetes with hyperglycemia: mgmt per primary team    #GOC:  note patient missing dialysis routinely as an outpatient- often because she is in pain and generally doesn't feel well. Palliative care has seen: full scope of care  Disposition per primary team   Leandra Pro, MD 11/11/2023 9:21 AM

## 2023-11-11 NOTE — Hospital Course (Addendum)
 The patient is a 58 y.o. female with medical history significant of  ESRD on HD (MWF) COPD, hypertension, NASH, AOCD, left eye blindness, type 2 diabetes mellitus with retinopathy, RLS, CHF who presents to the emergency department due to lower abdominal pain, nausea, NBNB vomiting.  Abdominal pain was sharp in nature, it was intermittent with no known alleviating/aggravating factors.  Last dialysis was on 10/21/2023 due to feeling unwell.  CT chest, abdomen and pelvis without contrast was negative for acute intrathoracic or intra-abdominal/pelvic abnormality.  Erosive changes at the pubic symphysis considered to be secondary to inflammatory process osteitis or infection.  She was found have a buttock cellulitis and initiated on antibiotics.  Since her fever resolved her antibiotics were discontinued but then she started having drainage again so general surgery was consulted and CT scan of the pelvis showed no drainable abscess.  She was resumed on antibiotics.  She is undergoing dialysis today and went into A-fib with RVR and was transferred to the intensive care unit and placed on a diltiazem drip but then became hypotensive.  This was subsequently stopped and she was anticoagulated with heparin drip and cardiology been consulted.  WBC is worsening so may need further involvement by ID.  Assessment and Plan:  Buttock Cellulitis:  Initially presented with fever, started empirically on vancomycin and Zosyn. Fever had resolved so antibiotics were discontinued but  Started having drainage from right buttock, general surgery was consulted for possible abscess.  CT of the pelvis showed no drainable abscess but showed cellulitis. Restarted vancomycin and cefepime. May need to re-image given worsening drainage and WBC. WBC Trend: Recent Labs  Lab 10/22/23 0000 10/23/23 0454 11/03/23 1714 11/04/23 0246 11/06/23 0634 11/09/23 1835 11/11/23 1320  WBC 3.8* 3.0* 5.9 5.8 9.4 18.3* 22.6*  -Wound care RN  consulted.  Lactic acid level was not elevated again but procalcitonin was 6.07.  May need further reevaluation by general surgery as well and discussion with ID given that her WBC is worsening  New Onset A Fib w/RVR / Elevated Troponin: Checked EKG and showed atrial fibrillation this tracing as last tracing on 8 Place showed normal sinus rhythm.  Changed her Eliquis to heparin drip.  Troponin was elevated at 330.  CHA2DS2-VASc score is at least 3-4.  Recent echocardiogram done a few weeks ago and showed normal LVEF.  Initiated on Cardizem drip but then became extremely hypotensive so this was discontinued and she had to begin fluids and albumin.  Cardiology recommending to initiate IV amiodarone once her systolic is stabilized without bolus and feel that she may ultimately convert this.   Acute encephalopathy/hypersomnolence:  Likely in setting of opioid overdose. ABG showed pCO2 42 yesterday; CT head showed No evidence of acute intracranial abnormality. Resolved after getting IV Narcan. Will discontinue oxycodone at this time. Will continue with only Tylenol as needed for now   Pubic symphysis inflammation:  CT pelvis shows erosive changes at the pubic symphysis likely from inflammatory process versus osteitis versus infection. 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 Nephrology was consulted for maintenance dialysis in the morning per EDP. Patient has been missing hemodialysis due to pain, nephrology recommending palliative care consult for goals of care. Palliative  care consulted, continue dialysis as outpatient   Lactic acidosis /Elevated anion gap: Lactic acid 3.8 > 2.4 and now improved to  1.0; anion gap 18. This is possibly related to several missed dialysis sessions Nephrology consulted for maintenance dialysis.  On broad-spectrum antibiotics as above   Hypertension -> Hypotension: Blood pressure had been elevated but then dropped in the setting of her A-fib with RVR and diltiazem drip.  Hold antihypertensive and was given albumin 25 g and IV fluid bolus.  To monitor blood pressures per protocol.  If not improving may need pressors.  Continue to monitor blood pressures per protocol.  Last blood pressure reading was 113/58 but did drop as low as 74/22  History of Calciphylaxis: Continue sensipar as well as Hectorol    Thrombocytopenia:  -Platelet count had dropped to 67 but now trending back up and is now 110.  Current platelet count trend over the last 7 days was: Recent Labs  Lab 10/22/23 0000 10/23/23 0454 11/03/23 1714 11/04/23 0246 11/06/23 0634 11/09/23 1835 11/11/23 1320  PLT 92* 85* 82* 67* 70* 112* 110*  -Likely due to splenic sequestration and infection.  Continue with broad-spectrum antibiotics as above   Splenic Infarct: Hold Eliquis and change to Heparin gtt given A Fib w/ RVR and elevated Troponin   OSA: Continue CPAP.     Restless Leg Syndrome: No longer on Gabapentin   GERD/GI Prophylaxis: C/w PPI with Pantoprazole 80 mg po Daily   Depression, Anxiety, Bipolar Disorder: C/w Buspirone 7.5 mg po BID and Paroxetine 20 mg po at bedtime  Normocytic Anemia/Anemia of Chronic Kidney Disease: Hemoglobin/hematocrit is now 9.1/29.4.  Continue with darbepoetin alpha 100 mcg subcu every Wednesday and continue monitor for signs or symptoms of bleeding given that she is now being anticoag with heparin drip.  Check anemia panel in AM.  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   Started on triple antibiotic ophthalmic ointment twice daily  Hypoalbuminemia: Patient's Albumin trending down and now 2.1 on last check. CTM and Trend and repeat CMP in the AM  Class III (Morbid) Obesity: Complicates overall prognosis and care -Estimated body mass index is 40.16 kg/m as calculated from the following:   Height as of this encounter: 5\' 8"  (1.727 m).   Weight as of this encounter: 119.8 kg. Weight Loss and Dietary Counseling given

## 2023-11-11 NOTE — Progress Notes (Signed)
 Inpatient Rehab Admissions Coordinator:   Per therapy updated recommendations pt was screened for CIR by Loye Rumble, PT, DPT.  Chart reviewed extensively.  From home alone (spouse in ALF) with a caregiver 25 hrs/week who may be her niece.  This is her 4th admission in 3 months.  She has been non-compliant with HD and presented with fever, abdominal pain, N/V.  Extensive workup revealed cellulitis of R buttock.  I do not feel she has the medical necessity to support a CIR admission at this time.  Would recommend alternative rehab venues be pursued.   Loye Rumble, PT, DPT Admissions Coordinator [phone] 11/11/23  1:28 PM

## 2023-11-11 NOTE — Progress Notes (Signed)
 PROGRESS NOTE    Martha [name]  XBJ:[id number] DOB: 01/26/1966 DOA: 11/03/2023 PCP: Carmel Sacramento, NP   Brief Narrative:  The patient is a 58 y.o. female with medical history significant of  ESRD on HD (MWF) COPD, hypertension, NASH, AOCD, left eye blindness, type 2 diabetes mellitus with retinopathy, RLS, CHF who presents to the emergency department due to lower abdominal pain, nausea, NBNB vomiting.  Abdominal pain was sharp in nature, it was intermittent with no known alleviating/aggravating factors.  Last dialysis was on 10/21/2023 due to feeling unwell.  CT chest, abdomen and pelvis without contrast was negative for acute intrathoracic or intra-abdominal/pelvic abnormality.  Erosive changes at the pubic symphysis considered to be secondary to inflammatory process osteitis or infection.  She was found have a buttock cellulitis and initiated on antibiotics.  Since her fever resolved her antibiotics were discontinued but then she started having drainage again so general surgery was consulted and CT scan of the pelvis showed no drainable abscess.  She was resumed on antibiotics.  She is undergoing dialysis today and went into A-fib with RVR and was transferred to the intensive care unit and placed on a diltiazem drip but then became hypotensive.  This was subsequently stopped and she was anticoagulated with heparin drip and cardiology been consulted.  WBC is worsening so may need further involvement by ID.  Assessment and Plan:  Buttock Cellulitis:  Initially presented with fever, started empirically on vancomycin and Zosyn. Fever had resolved so antibiotics were discontinued but  Started having drainage from right buttock, general surgery was consulted for possible abscess.  CT of the pelvis showed no drainable abscess but showed cellulitis. Restarted vancomycin and cefepime. May need to re-image given worsening drainage and WBC. WBC Trend: Recent Labs  Lab 10/22/23 0000 10/23/23 0454  11/03/23 1714 11/04/23 0246 11/06/23 0634 11/09/23 1835 11/11/23 1320  WBC 3.8* 3.0* 5.9 5.8 9.4 18.3* 22.6*  -Wound care RN consulted.  Lactic acid level was not elevated again but procalcitonin was 6.07.  May need further reevaluation by general surgery as well and discussion with ID given that her WBC is worsening  New Onset A Fib w/RVR / Elevated Troponin: Checked EKG and showed atrial fibrillation this tracing as last tracing on 8 Place showed normal sinus rhythm.  Changed her Eliquis to heparin drip.  Troponin was elevated at 330.  CHA2DS2-VASc score is at least 3-4.  Recent echocardiogram done a few weeks ago and showed normal LVEF.  Initiated on Cardizem drip but then became extremely hypotensive so this was discontinued and she had to begin fluids and albumin.  Cardiology recommending to initiate IV amiodarone once her systolic is stabilized without bolus and feel that she may ultimately convert this.   Acute encephalopathy/hypersomnolence:  Likely in setting of opioid overdose. ABG showed pCO2 42 yesterday; CT head showed No evidence of acute intracranial abnormality. Resolved after getting IV Narcan. Will discontinue oxycodone at this time. Will continue with only Tylenol as needed for now   Pubic symphysis inflammation:  CT pelvis shows erosive changes at the pubic symphysis likely from inflammatory process versus osteitis versus infection. 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 Nephrology was consulted for maintenance dialysis  in the morning per EDP. Patient has been missing hemodialysis due to pain, nephrology recommending palliative care consult for goals of care. Palliative  care consulted, continue dialysis as outpatient   Lactic acidosis /Elevated anion gap: Lactic acid 3.8 > 2.4 and now improved to 1.0; anion gap 18. This is possibly related to several missed dialysis sessions Nephrology consulted for maintenance dialysis.  On broad-spectrum antibiotics as above   Hypertension -> Hypotension: Blood pressure had been elevated but then dropped in the setting of her A-fib with RVR and diltiazem drip.  Hold antihypertensive and was given albumin 25 g and IV fluid bolus.  To monitor blood pressures per protocol.  If not improving may need pressors.  Continue to monitor blood pressures per protocol.  Last blood pressure reading was 113/58 but did drop as low as 74/22  History of Calciphylaxis: Continue sensipar as well as Hectorol    Thrombocytopenia:  -Platelet count had dropped to 67 but now trending back up and is now 110.  Current platelet count trend over the last 7 days was: Recent Labs  Lab 10/22/23 0000 10/23/23 0454 11/03/23 1714 11/04/23 0246 11/06/23 0634 11/09/23 1835 11/11/23 1320  PLT 92* 85* 82* 67* 70* 112* 110*  -Likely due to splenic sequestration and infection.  Continue with broad-spectrum antibiotics as above   Splenic Infarct: Hold Eliquis and change to Heparin gtt given A Fib w/ RVR and elevated Troponin   OSA: Continue CPAP.     Restless Leg Syndrome: No longer on Gabapentin   GERD/GI Prophylaxis: C/w PPI with Pantoprazole 80 mg po Daily   Depression, Anxiety, Bipolar Disorder: C/w Buspirone 7.5 mg po BID and Paroxetine 20 mg po at bedtime  Normocytic Anemia/Anemia of Chronic Kidney Disease: Hemoglobin/hematocrit is now 9.1/29.4.  Continue with darbepoetin alpha 100 mcg subcu every Wednesday and continue monitor for signs or symptoms of bleeding given that she is now being anticoag with heparin drip.  Check anemia panel in AM.  Continue monitor for signs and symptoms bleeding;   Mixed Hyperlipidemia: C/w Atorvastatin 20 mg po qHS     Blindness: Patient is blind in both eyes with the left palpebral irritation/chronic injury due to rubbing s/p enucleation OS.  Started on triple antibiotic ophthalmic ointment twice daily  Hypoalbuminemia: Patient's Albumin trending down and now 2.1 on last check. CTM and Trend and repeat CMP in the AM  Class III (Morbid) Obesity: Complicates overall prognosis and care -Estimated body mass index is 40.16 kg/m as calculated from the following:   Height as of this encounter: 5\' 8"  (1.727 m).   Weight as of this encounter: 119.8 kg. Weight Loss and Dietary Counseling given   DVT prophylaxis: SCDs Start: 11/04/23 0300    Code Status: Full Code Family Communication: No family at bedside  Disposition Plan:  Level of care: Stepdown Status is: Inpatient Remains inpatient appropriate because: Needs further clinical improvement as she has been transferred to the stepdown unit   Consultants:  Cardiology Nephrology General Surgery Palliative care medicine  Procedures:  As delineated as above  Antimicrobials:  Anti-infectives (From admission, onward)    Start     Dose/Rate Route Frequency Ordered Stop   11/09/23 1600  ceFEPIme (MAXIPIME) 2 g in sodium chloride 0.9 % 100 mL IVPB        2 g 200 mL/hr over 30 Minutes Intravenous Every M-W-F (Hemodialysis) 11/08/23 0854     11/09/23 1600  vancomycin (VANCOCIN) IVPB 1000 mg/200 mL premix  1,000 mg 200 mL/hr over 60 Minutes Intravenous Every M-W-F (Hemodialysis) 11/08/23 0855     11/08/23 0930  vancomycin (VANCOCIN) IVPB 1000 mg/200 mL premix        1,000 mg 200 mL/hr over 60 Minutes Intravenous  Once 11/08/23 0824 11/08/23 1201   11/08/23 0900  ceFEPIme (MAXIPIME) 2 g in sodium chloride 0.9 % 100 mL IVPB        2 g 200 mL/hr over 30 Minutes Intravenous  Once 11/08/23 0804 11/08/23 1008   11/05/23 1130  piperacillin-tazobactam (ZOSYN) 2.25 g in sodium chloride 0.9 % 50 mL IVPB  Status:  Discontinued        2.25 g 100 mL/hr over 30  Minutes Intravenous Every 8 hours 11/05/23 1054 11/05/23 1127   11/04/23 1200  vancomycin (VANCOCIN) IVPB 1000 mg/200 mL premix  Status:  Discontinued        1,000 mg 200 mL/hr over 60 Minutes Intravenous Every M-W-F (Hemodialysis) 11/04/23 0250 11/05/23 1127   11/04/23 0600  piperacillin-tazobactam (ZOSYN) IVPB 2.25 g  Status:  Discontinued        2.25 g 100 mL/hr over 30 Minutes Intravenous Every 8 hours 11/04/23 0250 11/05/23 1054   11/03/23 2315  vancomycin (VANCOREADY) IVPB 2000 mg/400 mL        2,000 mg 200 mL/hr over 120 Minutes Intravenous  Once 11/03/23 2300 11/04/23 0229   11/03/23 2315  piperacillin-tazobactam (ZOSYN) IVPB 3.375 g        3.375 g 100 mL/hr over 30 Minutes Intravenous  Once 11/03/23 2300 11/04/23 0024       Subjective: Seen and examined at bedside and she appeared uncomfortable sitting in the chair at bedside and kept stating "God help me".  Her buttock wound was examined with assistance of nursing and it was draining quite a bit.  Subsequently she went to dialysis and went into A-fib with RVR and had to be transitioned down to the stepdown unit and placed on a heparin drip and diltiazem drip which caused her to be hypotensive so this was discontinued.  Rapid response was called on my colleague Dr. Valerie Salts was able to evaluate her at bedside and cardiology was consulted for further evaluation.  Objective: Vitals:   11/11/23 1700 11/11/23 1715 11/11/23 1730 11/11/23 1743  BP: 109/66 (!) 96/33 102/65 (!) 113/58  Pulse: (!) 113 (!) 120 (!) 117 (!) 106  Resp: 19 (!) 22 17 14   Temp:      TempSrc:      SpO2: 95% 94% 98% 96%  Weight:      Height:        Intake/Output Summary (Last 24 hours) at 11/11/2023 1804 Last data filed at 11/11/2023 0900 Gross per 24 hour  Intake 340 ml  Output --  Net 340 ml   Filed Weights   11/06/23 1025 11/06/23 1445 11/11/23 1301  Weight: 121.7 kg 119.8 kg 117 kg   Examination: Physical Exam:  Constitutional: Morbidly obese  chronically ill-appearing occasion female who is blind and appears uncomfortable Respiratory: Diminished to auscultation bilaterally, no wheezing, rales, rhonchi or crackles. Normal respiratory effort and patient is not tachypenic. No accessory muscle use.  Cardiovascular: RRR, no murmurs / rubs / gallops. S1 and S2 auscultated.  Mild extremity edema Abdomen: Soft, non-tender, just secondary to body habitus. Bowel sounds positive.  GU: Deferred. Musculoskeletal: No clubbing / cyanosis of digits/nails. No joint deformity upper and lower extremities.  Skin: Right buttock/ischial wound evaluated and is draining and has some purulence with foul-smelling  odor. Neurologic: Patient is blind but cranial nerves II through XII grossly intact.  Romberg sign cerebellar reflexes not assessed.  Psychiatric: Very anxious and appears uncomfortable  Data Reviewed: I have personally reviewed following labs and imaging studies  CBC: Recent Labs  Lab 11/06/23 0634 11/09/23 1835 11/11/23 1320  WBC 9.4 18.3* 22.6*  HGB 10.3* 8.9* 9.1*  HCT 35.1* 29.4* 29.4*  MCV 93.9 90.7 90.2  PLT 70* 112* 110*   Basic Metabolic Panel: Recent Labs  Lab 11/06/23 0520 11/09/23 1835 11/11/23 1320  NA 135 132* 135  K 3.9 3.8 3.4*  CL 99 93* 92*  CO2 22 25 25   GLUCOSE 131* 154* 175*  BUN 45* 54* 43*  CREATININE 8.25* 7.96* 6.12*  CALCIUM 8.8* 9.4 9.2  PHOS 3.9 4.2 2.9   GFR: Estimated Creatinine Clearance: 13.1 mL/min (A) (by C-G formula based on SCr of 6.12 mg/dL (H)). Liver Function Tests: Recent Labs  Lab 11/06/23 0520 11/09/23 1835 11/11/23 1320  ALBUMIN 2.5* 2.1* 1.9*   No results for input(s): "LIPASE", "AMYLASE" in the last 168 hours. No results for input(s): "AMMONIA" in the last 168 hours. Coagulation Profile: No results for input(s): "INR", "PROTIME" in the last 168 hours. Cardiac Enzymes: No results for input(s): "CKTOTAL", "CKMB", "CKMBINDEX", "TROPONINI" in the last 168 hours. BNP (last 3  results) No results for input(s): "PROBNP" in the last 8760 hours. HbA1C: No results for input(s): "HGBA1C" in the last 72 hours. CBG: No results for input(s): "GLUCAP" in the last 168 hours. Lipid Profile: No results for input(s): "CHOL", "HDL", "LDLCALC", "TRIG", "CHOLHDL", "LDLDIRECT" in the last 72 hours. Thyroid Function Tests: Recent Labs    11/11/23 1441  TSH 4.577*   Anemia Panel: No results for input(s): "VITAMINB12", "FOLATE", "FERRITIN", "TIBC", "IRON", "RETICCTPCT" in the last 72 hours. Sepsis Labs: Recent Labs  Lab 11/11/23 1646  PROCALCITON 6.07  LATICACIDVEN 1.0   Recent Results (from the past 240 hours)  Resp panel by RT-PCR (RSV, Flu A&B, Covid) Anterior Nasal Swab     Status: None   Collection Time: 11/03/23  4:54 PM   Specimen: Anterior Nasal Swab  Result Value Ref Range Status   SARS Coronavirus 2 by RT PCR NEGATIVE NEGATIVE Final    Comment: (NOTE) SARS-CoV-2 target nucleic acids are NOT DETECTED.  The SARS-CoV-2 RNA is generally detectable in upper respiratory specimens during the acute phase of infection. The lowest concentration of SARS-CoV-2 viral copies this assay can detect is 138 copies/mL. A negative result does not preclude SARS-Cov-2 infection and should not be used as the sole basis for treatment or other patient management decisions. A negative result may occur with  improper specimen collection/handling, submission of specimen other than nasopharyngeal swab, presence of viral mutation(s) within the areas targeted by this assay, and inadequate number of viral copies(<138 copies/mL). A negative result must be combined with clinical observations, patient history, and epidemiological information. The expected result is Negative.  Fact Sheet for Patients:  BloggerCourse.com  Fact Sheet for Healthcare Providers:  SeriousBroker.it  This test is no t yet approved or cleared by the [place] FDA and  has been authorized for detection and/or diagnosis of SARS-CoV-2 by FDA under an Emergency Use Authorization (EUA). This EUA will remain  in effect (meaning this test can be used) for the duration of the COVID-19 declaration under Section 564(b)(1) of the Act, 21 U.S.C.section 360bbb-3(b)(1), unless the authorization is terminated  or revoked sooner.       Influenza A by  PCR NEGATIVE NEGATIVE Final   Influenza B by PCR NEGATIVE NEGATIVE Final    Comment: (NOTE) The Xpert Xpress SARS-CoV-2/FLU/RSV plus assay is intended as an aid in the diagnosis of influenza from Nasopharyngeal swab specimens and should not be used as a sole basis for treatment. Nasal washings and aspirates are unacceptable for Xpert Xpress SARS-CoV-2/FLU/RSV testing.  Fact Sheet for Patients: BloggerCourse.com  Fact Sheet for Healthcare Providers: SeriousBroker.it  This test is not yet approved or cleared by the [place] FDA and has been authorized for detection and/or diagnosis of SARS-CoV-2 by FDA under an Emergency Use Authorization (EUA). This EUA will remain in effect (meaning this test can be used) for the duration of the COVID-19 declaration under Section 564(b)(1) of the Act, 21 U.S.C. section 360bbb-3(b)(1), unless the authorization is terminated or revoked.     Resp Syncytial Virus by PCR NEGATIVE NEGATIVE Final    Comment: (NOTE) Fact Sheet for Patients: BloggerCourse.com  Fact Sheet for Healthcare Providers: SeriousBroker.it  This test is not yet approved or cleared by the [place] FDA and has been authorized for detection and/or diagnosis of SARS-CoV-2 by FDA under an Emergency Use Authorization (EUA). This EUA will remain in effect (meaning this test can be used) for the duration of the COVID-19 declaration under Section 564(b)(1) of the Act, 21 U.S.C. section  360bbb-3(b)(1), unless the authorization is terminated or revoked.  Performed at [facility], [address] [place], [place] [address]   Blood Culture (routine x 2)     Status: None   Collection Time: 11/03/23  5:14 PM   Specimen: BLOOD  Result Value Ref Range Status   Specimen Description BLOOD RIGHT ANTECUBITAL  Final   Special Requests   Final    BOTTLES DRAWN AEROBIC AND ANAEROBIC Blood Culture results may not be optimal due to an inadequate volume of blood received in culture bottles   Culture   Final    NO GROWTH 5 DAYS Performed at [facility], [address], [place], [place] [address]    Report Status 11/08/2023 FINAL  Final  Respiratory (~20 pathogens) panel by PCR     Status: None   Collection Time: 11/03/23  7:19 PM   Specimen: Nasopharyngeal Swab; Respiratory  Result Value Ref Range Status   Adenovirus NOT DETECTED NOT DETECTED Final   Coronavirus 229E NOT DETECTED NOT DETECTED Final    Comment: (NOTE) The Coronavirus on the Respiratory Panel, DOES NOT test for the novel  Coronavirus (2019 nCoV)    Coronavirus HKU1 NOT DETECTED NOT DETECTED Final   Coronavirus NL63 NOT DETECTED NOT DETECTED Final   Coronavirus OC43 NOT DETECTED NOT DETECTED Final   Metapneumovirus NOT DETECTED NOT DETECTED Final   Rhinovirus / Enterovirus NOT DETECTED NOT DETECTED Final   Influenza A NOT DETECTED NOT DETECTED Final   Influenza B NOT DETECTED NOT DETECTED Final   Parainfluenza Virus 1 NOT DETECTED NOT DETECTED Final   Parainfluenza Virus 2 NOT DETECTED NOT DETECTED Final   Parainfluenza Virus 3 NOT DETECTED NOT DETECTED Final   Parainfluenza Virus 4 NOT DETECTED NOT DETECTED Final   Respiratory Syncytial Virus NOT DETECTED NOT DETECTED Final   Bordetella pertussis NOT DETECTED NOT DETECTED Final   Bordetella Parapertussis NOT DETECTED NOT DETECTED Final   Chlamydophila pneumoniae NOT DETECTED NOT DETECTED Final   Mycoplasma pneumoniae NOT DETECTED NOT DETECTED Final     Comment: Performed at [facility], [address] [place], [place] [address]  MRSA Next Gen  by PCR, Nasal     Status: Abnormal   Collection Time: 11/05/23 12:00 PM   Specimen: Nasal Mucosa; Nasal Swab  Result Value Ref Range Status   MRSA by PCR Next Gen DETECTED (A) NOT DETECTED Final    Comment: RESULT CALLED TO, READ BACK BY AND VERIFIED WITH: D HAIRSTON AT 1356 11/05/23 BY A WILSON (NOTE) The GeneXpert MRSA Assay (FDA approved for NASAL specimens only), is one component of a comprehensive MRSA colonization surveillance program. It is not intended to diagnose MRSA infection nor to guide or monitor treatment for MRSA infections. Test performance is not FDA approved in patients less than 54 years old. Performed at [facility], [address] [place], [place] [address]   MRSA Next Gen by PCR, Nasal     Status: Abnormal   Collection Time: 11/11/23  2:06 PM   Specimen: Nasal Mucosa; Nasal Swab  Result Value Ref Range Status   MRSA by PCR Next Gen DETECTED (A) NOT DETECTED Final    Comment: RESULT CALLED TO, READ BACK BY AND VERIFIED WITH: JENNIFER KING @ 1731 ON 11/11/23 C VARNER (NOTE) The GeneXpert MRSA Assay (FDA approved for NASAL specimens only), is one component of a comprehensive MRSA colonization surveillance program. It is not intended to diagnose MRSA infection nor to guide or monitor treatment for MRSA infections. Test performance is not FDA approved in patients less than 39 years old. Performed at [facility], [address], [place], [place] [address]     Radiology Studies: CT HEAD WO CONTRAST ( ) Result Date: 11/10/2023 CLINICAL DATA:  Mental status change, unknown cause. EXAM: CT HEAD WITHOUT CONTRAST TECHNIQUE: Contiguous axial images were obtained from the base of the skull through the vertex without intravenous contrast. RADIATION DOSE REDUCTION: This exam was performed according to the departmental dose-optimization program which includes automated  exposure control, adjustment of the mA and/or kV according to patient size and/or use of iterative reconstruction technique. COMPARISON:  CT orbits October 04, 2019.  CT head Nov 29, 2018. FINDINGS: Brain: No evidence of acute infarction, hemorrhage, hydrocephalus, extra-axial collection or mass lesion/mass effect. Vascular: No hyperdense vessel.  Calcific atherosclerosis. Skull: No acute fracture. Sinuses/Orbits: Stable chronic postoperative changes of the globes. Clear sinuses. Remote right medial orbital wall fracture. IMPRESSION: No evidence of acute intracranial abnormality. Electronically Signed   By: Stevenson Elbe M.D.   On: 11/10/2023 19:14   Scheduled Meds:  atorvastatin  20 mg Oral Daily   bacitracin-polymyxin b   Left Eye BID   busPIRone  7.5 mg Oral BID   calcitRIOL  2 mcg Oral Q M,W,F   Chlorhexidine Gluconate Cloth  6 each Topical Q0600   cinacalcet  30 mg Oral Q M,W,F   darbepoetin (ARANESP) injection - DIALYSIS  100 mcg Subcutaneous Q Wed-1800   feeding supplement (NEPRO CARB STEADY)  237 mL Oral BID BM   leptospermum manuka honey  1 Application Topical Daily   liver oil-zinc oxide   Topical BID   mupirocin ointment   Nasal BID   nystatin   Topical TID   pantoprazole  80 mg Oral Daily   PARoxetine  20 mg Oral QHS   sucroferric oxyhydroxide  1,000 mg Oral TID WC   Continuous Infusions:  amiodarone     [START ON 11/12/2023] amiodarone     ceFEPime (MAXIPIME) IV 2 g (11/09/23 2131)   heparin     vancomycin 1,000 mg (11/11/23 1652)    LOS: 8 days   Aura Leeds, DO  [facility] Available via Epic secure chat 7am-7pm After these hours, please refer to coverage provider listed on [email or web address] 11/11/2023, 6:04 PM

## 2023-11-11 NOTE — Plan of Care (Signed)
  Problem: Acute Rehab OT Goals (only OT should resolve) Goal: Pt. Will Perform Upper Body Bathing Flowsheets (Taken 11/11/2023 0943) Pt Will Perform Upper Body Bathing:  with contact guard assist  sitting Goal: Pt. Will Perform Upper Body Dressing Flowsheets (Taken 11/11/2023 0943) Pt Will Perform Upper Body Dressing:  with contact guard assist  sitting Goal: Pt. Will Transfer To Toilet Flowsheets (Taken 11/11/2023 [phone]) Pt Will Transfer to Toilet:  with contact guard assist  stand pivot transfer Goal: Pt. Will Perform Toileting-Clothing Manipulation Flowsheets (Taken 11/11/2023 0943) Pt Will Perform Toileting - Clothing Manipulation and hygiene:  with contact guard assist  sitting/lateral leans Goal: Pt/Caregiver Will Perform Home Exercise Program Flowsheets (Taken 11/11/2023 [phone]) Pt/caregiver will Perform Home Exercise Program:  Increased ROM  Increased strength  Both right and left upper extremity  Independently  Sanuel Ladnier OT, MOT

## 2023-11-11 NOTE — Progress Notes (Signed)
 Date and time results received: 11/11/23 1734 (use smartphrase ".now" to insert current time)  Test: MRSA of Nares  Critical Value: Positive  Name of Provider Notified: Dr Gillermo Lack and Silva Drone RN (patient RN)  Orders Received? Or Actions Taken?:  Standing orders placed for Bactroban Ointment BID

## 2023-11-11 NOTE — Progress Notes (Signed)
 Patient refusing to wear CPAP tonight. RN tried multiple times with no success. Unit at bedside if needed.

## 2023-11-11 NOTE — Procedures (Signed)
 HD Note:  Some information was entered later than the data was gathered due to patient care needs. The stated time with the data is accurate.  Patient moved to ICU 12 for management of A fib.  Treatment resumed when patient settled, see flowsheet for timing.   Alert and responsive.  Patient verbalizations of "Lord help me" much less.  Patient more sedate.   Access used: Did not re-cannulate.  Access continued to function after pause in treatment.  Lines kept flushed with NS between treatments Access issues: None  Patient's husband called her on her cell phone.  I updated him as to recent change of condition and room.  He stated he did not have his usual phone with him.  Patient's BP decreased below acceptable limits.  Patient's bed placed in trendelenberg position.  200 ml NS given.  See flowsheet for details.  TX duration: 3.5 hours  Alert, without acute distress.  Total UF removed: 500 ml  Hand-off given to patient's nurse.    Haedyn Ancrum L. Alva Jewels, RN Kidney Dialysis Unit.

## 2023-11-11 NOTE — Progress Notes (Signed)
  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  - Dialysis paused due to above -Patient transferred to stepdown unit to resume hemodialysis and start IV Cardizem drip -Patient became hypotensive -Dialysis paused again -Cardizem drip put on hold -IV fluid and IV albumin given -Cardiology consult from Dr. Londa Rival appreciated -Nephrology input appreciated -Primary attending Dr. Gillermo Lack, Jonel Nephew notified -- New onset atrial fibrillation with hemodynamic compromise -Continue to monitor hemodynamics after IV fluids and IV albumin -Continue IV heparin -Defer to cardiology team if iv amiodarone would be an option - Total critical care time 38 minutes  CRITICAL CARE Performed by: Colin Dawley   Total critical care time: 38 minutes  Critical care time was exclusive of separately billable procedures and treating other patients.  New onset atrial fibrillation with hemodynamic compromise -Continue to monitor hemodynamics after IV fluids and IV albumin  Critical care was necessary to treat or prevent imminent or life-threatening deterioration.  Critical care was time spent personally by me on the following activities: development of treatment plan with patient and/or surrogate as well as nursing, discussions with consultants, evaluation of patient's response to treatment, examination of patient, obtaining history from patient or surrogate, ordering and performing treatments and interventions, ordering and review of laboratory studies, ordering and review of  radiographic studies, pulse oximetry and re-evaluation of patient's condition.   Colin Dawley, MD

## 2023-11-12 DIAGNOSIS — D735 Infarction of spleen: Secondary | ICD-10-CM | POA: Diagnosis not present

## 2023-11-12 DIAGNOSIS — R509 Fever, unspecified: Secondary | ICD-10-CM | POA: Diagnosis not present

## 2023-11-12 DIAGNOSIS — I5A Non-ischemic myocardial injury (non-traumatic): Secondary | ICD-10-CM | POA: Diagnosis not present

## 2023-11-12 DIAGNOSIS — I4891 Unspecified atrial fibrillation: Secondary | ICD-10-CM | POA: Diagnosis not present

## 2023-11-12 DIAGNOSIS — G9340 Encephalopathy, unspecified: Secondary | ICD-10-CM

## 2023-11-12 DIAGNOSIS — L03317 Cellulitis of buttock: Secondary | ICD-10-CM | POA: Diagnosis not present

## 2023-11-12 LAB — URINALYSIS, W/ REFLEX TO CULTURE (INFECTION SUSPECTED)
Bilirubin Urine: NEGATIVE
Glucose, UA: 50 mg/dL — AB
Ketones, ur: NEGATIVE mg/dL
Nitrite: NEGATIVE
Protein, ur: >=300 mg/dL — AB
Specific Gravity, Urine: 1.016 (ref 1.005–1.030)
pH: 6 (ref 5.0–8.0)

## 2023-11-12 LAB — CBC WITH DIFFERENTIAL/PLATELET
Abs Immature Granulocytes: 0.31 10*3/uL — ABNORMAL HIGH (ref 0.00–0.07)
Basophils Absolute: 0 10*3/uL (ref 0.0–0.1)
Basophils Relative: 0 %
Eosinophils Absolute: 0.2 10*3/uL (ref 0.0–0.5)
Eosinophils Relative: 2 %
HCT: 29 % — ABNORMAL LOW (ref 36.0–46.0)
Hemoglobin: 8.7 g/dL — ABNORMAL LOW (ref 12.0–15.0)
Immature Granulocytes: 2 %
Lymphocytes Relative: 6 %
Lymphs Abs: 0.9 10*3/uL (ref 0.7–4.0)
MCH: 27.4 pg (ref 26.0–34.0)
MCHC: 30 g/dL (ref 30.0–36.0)
MCV: 91.2 fL (ref 80.0–100.0)
Monocytes Absolute: 0.4 10*3/uL (ref 0.1–1.0)
Monocytes Relative: 3 %
Neutro Abs: 13 10*3/uL — ABNORMAL HIGH (ref 1.7–7.7)
Neutrophils Relative %: 87 %
Platelets: 107 10*3/uL — ABNORMAL LOW (ref 150–400)
RBC: 3.18 MIL/uL — ABNORMAL LOW (ref 3.87–5.11)
RDW: 17.9 % — ABNORMAL HIGH (ref 11.5–15.5)
WBC: 14.8 10*3/uL — ABNORMAL HIGH (ref 4.0–10.5)
nRBC: 0 % (ref 0.0–0.2)

## 2023-11-12 LAB — PHOSPHORUS: Phosphorus: 2.9 mg/dL (ref 2.5–4.6)

## 2023-11-12 LAB — COMPREHENSIVE METABOLIC PANEL WITH GFR
ALT: 9 U/L (ref 0–44)
AST: 10 U/L — ABNORMAL LOW (ref 15–41)
Albumin: 2.2 g/dL — ABNORMAL LOW (ref 3.5–5.0)
Alkaline Phosphatase: 75 U/L (ref 38–126)
Anion gap: 12 (ref 5–15)
BUN: 27 mg/dL — ABNORMAL HIGH (ref 6–20)
CO2: 26 mmol/L (ref 22–32)
Calcium: 8.9 mg/dL (ref 8.9–10.3)
Chloride: 95 mmol/L — ABNORMAL LOW (ref 98–111)
Creatinine, Ser: 3.73 mg/dL — ABNORMAL HIGH (ref 0.44–1.00)
GFR, Estimated: 14 mL/min — ABNORMAL LOW (ref >60)
Glucose, Bld: 177 mg/dL — ABNORMAL HIGH (ref 70–99)
Potassium: 3.3 mmol/L — ABNORMAL LOW (ref 3.5–5.1)
Sodium: 133 mmol/L — ABNORMAL LOW (ref 135–145)
Total Bilirubin: 1.4 mg/dL — ABNORMAL HIGH (ref 0.0–1.2)
Total Protein: 5.6 g/dL — ABNORMAL LOW (ref 6.5–8.1)

## 2023-11-12 LAB — HEPARIN LEVEL (UNFRACTIONATED): Heparin Unfractionated: >1.1 [IU]/mL — ABNORMAL HIGH (ref 0.30–0.70)

## 2023-11-12 LAB — T4, FREE: Free T4: 1.41 ng/dL — ABNORMAL HIGH (ref 0.61–1.12)

## 2023-11-12 LAB — PROCALCITONIN: Procalcitonin: 6.57 ng/mL

## 2023-11-12 LAB — MAGNESIUM: Magnesium: 1.9 mg/dL (ref 1.7–2.4)

## 2023-11-12 LAB — APTT
aPTT: 41 s — ABNORMAL HIGH (ref 24–36)
aPTT: 49 s — ABNORMAL HIGH (ref 24–36)
aPTT: 66 s — ABNORMAL HIGH (ref 24–36)

## 2023-11-12 LAB — GLUCOSE, CAPILLARY: Glucose-Capillary: 159 mg/dL — ABNORMAL HIGH (ref 70–99)

## 2023-11-12 MED ORDER — POTASSIUM CHLORIDE CRYS ER 20 MEQ PO TBCR
20.0000 meq | EXTENDED_RELEASE_TABLET | Freq: Once | ORAL | Status: AC
  Administered 2023-11-12: 20 meq via ORAL
  Filled 2023-11-12: qty 1

## 2023-11-12 MED ORDER — DAKINS (1/4 STRENGTH) 0.125 % EX SOLN
Freq: Two times a day (BID) | CUTANEOUS | Status: AC
  Administered 2023-11-13: 1
  Filled 2023-11-12: qty 473

## 2023-11-12 MED ORDER — HYDROMORPHONE HCL 1 MG/ML IJ SOLN
0.5000 mg | INTRAMUSCULAR | Status: DC | PRN
  Administered 2023-11-12 – 2023-11-16 (×18): 0.5 mg via INTRAVENOUS
  Filled 2023-11-12 (×18): qty 0.5

## 2023-11-12 MED ORDER — GUAIFENESIN-DM 100-10 MG/5ML PO SYRP: 5.0000 mL | ORAL_SOLUTION | ORAL | Status: DC | PRN

## 2023-11-12 MED ORDER — CHLORHEXIDINE GLUCONATE CLOTH 2 % EX PADS
6.0000 | MEDICATED_PAD | Freq: Every day | CUTANEOUS | Status: DC
  Administered 2023-11-12 – 2023-11-17 (×6): 6 via TOPICAL

## 2023-11-12 MED ORDER — COLLAGENASE 250 UNIT/GM EX OINT
TOPICAL_OINTMENT | Freq: Every day | CUTANEOUS | Status: DC
  Filled 2023-11-12: qty 30

## 2023-11-12 NOTE — Progress Notes (Addendum)
 Progress Note  Patient Name: Martha [name] Date of Encounter: 11/12/2023  Primary Cardiologist: None  Subjective   Somnolent. Waxing and waning.  Inpatient Medications    Scheduled Meds:  atorvastatin  20 mg Oral Daily   bacitracin-polymyxin b   Left Eye BID   busPIRone  7.5 mg Oral BID   calcitRIOL  2 mcg Oral Q M,W,F   Chlorhexidine Gluconate Cloth  6 each Topical Q0600   cinacalcet  30 mg Oral Q M,W,F   [START ON 11/15/2023] collagenase   Topical Daily   darbepoetin (ARANESP) injection - DIALYSIS  100 mcg Subcutaneous Q Wed-1800   feeding supplement (NEPRO CARB STEADY)  237 mL Oral BID BM   liver oil-zinc oxide   Topical BID   mupirocin ointment   Nasal BID   nystatin   Topical TID   pantoprazole  80 mg Oral Daily   PARoxetine  20 mg Oral QHS   sodium hypochlorite   Irrigation BID   sucroferric oxyhydroxide  1,000 mg Oral TID WC   Continuous Infusions:  amiodarone 30 mg/hr (11/12/23 0700)   ceFEPime (MAXIPIME) IV Stopped (11/11/23 1852)   heparin 1,500 Units/hr (11/12/23 0841)   vancomycin Stopped (11/11/23 1742)   PRN Meds: acetaminophen **OR** acetaminophen, ALPRAZolam, loperamide, naLOXone (NARCAN)  injection, ondansetron **OR** ondansetron (ZOFRAN) IV   Vital Signs    Vitals:   11/12/23 0700 11/12/23 0706 11/12/23 0800 11/12/23 0815  BP:  (!) 148/61 120/66   Pulse: 97 (!) 110 81   Resp: (!) 26 19 19    Temp:    97.9 F (36.6 C)  TempSrc:    Oral  SpO2: 95% 96% 97%   Weight:      Height:        Intake/Output Summary (Last 24 hours) at 11/12/2023 1112 Last data filed at 11/12/2023 0841 Gross per 24 hour  Intake 854.04 ml  Output 520 ml  Net 334.04 ml   Filed Weights   11/11/23 1301 11/11/23 1812  Weight: 117 kg 114 kg    Telemetry     Personally reviewed. Afib, HR 80s  ECG    Not performed today  Physical Exam   GEN: No acute distress.   Neck: Unable to examine due to patient being somnolent Cardiac: RRR, no murmur, rub, or gallop.   Respiratory: Nonlabored. Clear to auscultation bilaterally. GI: Soft, nontender, bowel sounds present. MS: No edema; No deformity. Neuro:  Nonfocal. Psych: Somnolent  Labs    Chemistry Recent Labs  Lab 11/09/23 1835 11/11/23 1320 11/12/23 0551  NA 132* 135 133*  K 3.8 3.4* 3.3*  CL 93* 92* 95*  CO2 25 25 26   GLUCOSE 154* 175* 177*  BUN 54* 43* 27*  CREATININE 7.96* 6.12* 3.73*  CALCIUM 9.4 9.2 8.9  PROT  --   --  5.6*  ALBUMIN 2.1* 1.9* 2.2*  AST  --   --  10*  ALT  --   --  9  ALKPHOS  --   --  75  BILITOT  --   --  1.4*  GFRNONAA 6* 8* 14*  ANIONGAP 14 18* 12     Hematology Recent Labs  Lab 11/09/23 1835 11/11/23 1320 11/12/23 0551  WBC 18.3* 22.6* 14.8*  RBC 3.24* 3.26* 3.18*  HGB 8.9* 9.1* 8.7*  HCT 29.4* 29.4* 29.0*  MCV 90.7 90.2 91.2  MCH 27.5 27.9 27.4  MCHC 30.3 31.0 30.0  RDW 18.5* 18.3* 17.9*  PLT 112* 110* 107*  Cardiac Enzymes Recent Labs  Lab 10/20/23 2132 11/03/23 1714 11/03/23 1921 11/11/23 1441 11/11/23 1646  TROPONINIHS 12 12 14  330* 322*    BNPNo results for input(s): "BNP", "PROBNP" in the last 168 hours.   DDimerNo results for input(s): "DDIMER" in the last 168 hours.   Radiology    CT HEAD WO CONTRAST ( ) Result Date: 11/10/2023 CLINICAL DATA:  Mental status change, unknown cause. EXAM: CT HEAD WITHOUT CONTRAST TECHNIQUE: Contiguous axial images were obtained from the base of the skull through the vertex without intravenous contrast. RADIATION DOSE REDUCTION: This exam was performed according to the departmental dose-optimization program which includes automated exposure control, adjustment of the mA and/or kV according to patient size and/or use of iterative reconstruction technique. COMPARISON:  CT orbits October 04, 2019.  CT head Nov 29, 2018. FINDINGS: Brain: No evidence of acute infarction, hemorrhage, hydrocephalus, extra-axial collection or mass lesion/mass effect. Vascular: No hyperdense vessel.  Calcific  atherosclerosis. Skull: No acute fracture. Sinuses/Orbits: Stable chronic postoperative changes of the globes. Clear sinuses. Remote right medial orbital wall fracture. IMPRESSION: No evidence of acute intracranial abnormality. Electronically Signed   By: Stevenson Elbe M.D.   On: 11/10/2023 19:14    Assessment & Plan   Atrial fibrillation with RVR: New onset this admission in the setting of buttock cellulitis. Initially started on Diltiazem but became hypotensive. On Amio drip, HR 80s and in Afib. Goal HR<120 bpm. Somnolent during the interview. Switch Amio drip to PO Amio 200 mg BID starting tomorrow. Continue heparin drip and switch to DOAC upon discharge. She will  benefit from outpateint DCCV if she continues to be in Afib and when she demonstrates medication compliance.Will see her in the clinic. Outpatient OSA evaluation, if not tested before.   Myocardial injury secondary to demand ischemia from Afib and RVR, buttock cellulitis: Somnolent. No indication of ischemia evaluation at this time. Hs troponins 330>>322.  Buttock cellulitis: On antibiotics.  Acute metabolic encephalopathy: Likely from sepsis.  ESRD DD: Recent non-compliance. Nephrology on board.  Hx of splenic infarct: On Eliquis at home.  CRITICAL CARE Performed by: Kennyth Pean Kristine Chahal   Total critical care time: 30 minutes  Critical care time was exclusive of separately billable procedures and treating other patients.  Critical care was necessary to treat or prevent imminent or life-threatening deterioration.  Critical care was time spent personally by me on the following activities: development of treatment plan with patient and/or surrogate as well as nursing, discussions with consultants, evaluation of patient's response to treatment, examination of patient, obtaining history from patient or surrogate, ordering and performing treatments and interventions, ordering and review of laboratory studies, ordering and  review of radiographic studies, pulse oximetry and re-evaluation of patient's condition.   Signed, Day Deery Babs Less, MD  11/12/2023, 11:12 AM

## 2023-11-12 NOTE — Consult Note (Signed)
 WOC Nurse wound follow up Wound type: R glute/ischial wound  Measurement: see nursing flowsheet  Wound bed: black yellow stringy necrotic tissue  Drainage (amount, consistency, odor) tan on old dressing  Periwound: erythema  Dressing procedure/placement/frequency: Cleanse R glute/ischial wound with Vashe wound cleanser, apply Dakin's moistened gauze to wound bed making sure to cover any area of depth 2 times daily, cover with dry gauze and ABD pad and tape.  After 3 days Dakin's will switch to Santyl for continued debridement.   Secure chat sent to primary MD regarding this wound which has declined in spite of treatment.  Would recommend surgery be consulted to re-evaluate this wound for possible debridement.    POC discussed with primary MD and bedside nurse.   WOC team will not follow Re-consult if further needs arise.   Thank you,    Ronni Colace MSN, RN-BC, [employer] [phone]

## 2023-11-12 NOTE — Plan of Care (Signed)
 Remains on amio and heparin gtt for Afib. Rate controlled today 90s-100. Pt lethargic today with periods of agitation due to pain. MD made aware. Wounds documented and dressed, foley placed to promote wound healing and pain control optimized.  Problem: Education: Goal: Knowledge of General Education information will improve Description: Including pain rating scale, medication(s)/side effects and non-pharmacologic comfort measures Outcome: Progressing   Problem: Health Behavior/Discharge Planning: Goal: Ability to manage health-related needs will improve Outcome: Progressing   Problem: Clinical Measurements: Goal: Ability to maintain clinical measurements within normal limits will improve Outcome: Progressing Goal: Will remain free from infection Outcome: Progressing Goal: Diagnostic test results will improve Outcome: Progressing Goal: Respiratory complications will improve Outcome: Progressing Goal: Cardiovascular complication will be avoided Outcome: Progressing   Problem: Activity: Goal: Risk for activity intolerance will decrease Outcome: Progressing   Problem: Nutrition: Goal: Adequate nutrition will be maintained Outcome: Progressing   Problem: Coping: Goal: Level of anxiety will decrease Outcome: Progressing   Problem: Elimination: Goal: Will not experience complications related to bowel motility Outcome: Progressing Goal: Will not experience complications related to urinary retention Outcome: Progressing   Problem: Pain Managment: Goal: General experience of comfort will improve and/or be controlled Outcome: Progressing   Problem: Safety: Goal: Ability to remain free from injury will improve Outcome: Progressing   Problem: Skin Integrity: Goal: Risk for impaired skin integrity will decrease Outcome: Progressing   Problem: Clinical Measurements: Goal: Ability to avoid or minimize complications of infection will improve Outcome: Progressing   Problem: Skin  Integrity: Goal: Skin integrity will improve Outcome: Progressing   Problem: Clinical Measurements: Goal: Ability to avoid or minimize complications of infection will improve Outcome: Progressing   Problem: Skin Integrity: Goal: Skin integrity will improve Outcome: Progressing

## 2023-11-12 NOTE — Progress Notes (Signed)
 Asked to see patient concerning her nonhealing right ischial gluteal wound.  Reviewed wound care consultation.  At bedside I did manually remove some necrotic tissue.  I could not do a sharp debridement as the patient is on heparin drip. Agree with resuming Dakin's solution.  Will follow with you and see if this helps clean up the wound.  For wound healing will be complicated due to her comorbidities.

## 2023-11-12 NOTE — Progress Notes (Signed)
 Patient has mittens on and is still restless and non compliant with CPAP. Unit at bedside if she decides to try and wear it tonight. Patient's home mask attached to hospital CPAP.

## 2023-11-12 NOTE — Progress Notes (Signed)
 [facility] Progress Note   Assessment/ Plan:   Outpatient dialysis prescription:  [facility] MWF EDW 120.5kg  4hrs AVF BFR 400/DFR 500 Heparin 1000 bolus and 800/hr Venofer 50 weekly Mircera 200 q2wks Sensipar 30 qtx, calcitriol qtx     Assessment/Plan:    # ESRD: - HD per MWF schedule.  Next HD tomorrow.  Will ask that she be done separately in her ICU room   # Volume/ hypertension: Hypertensive and volume overloaded initially--improved. Significant body edema on initial CT. UF as tolerated, may need edw adjustment on d/c   # Anemia of Chronic Kidney Disease: - No IV iron for now given infection.  - Aranesp 60 mcg every Wednesday---increasing to    # Secondary Hyperparathyroidism/Hyperphosphatemia: Ca and phos acceptable. On sensipar and calcitriol Note history of calciphylaxis.   #Buttock cellulitis: On abx per primary team. Vanc+cefepime w/ HD   # Uncontrolled type 2 diabetes with hyperglycemia: mgmt per primary team    #GOC:  note patient missing dialysis routinely as an outpatient- often because she is in pain and generally doesn't feel well. Palliative care has seen: full scope of care   Disposition per primary team   Subjective:    Seen in room.  Had HD yesterday but went into Afib with RVR.  Treatment paused and pt was transferred to ICU.  Had dilt gtt initially- now to [facility].  She is sleeping, difficult to arouse but does so.     Objective:   BP (!) 148/61   Pulse (!) 110   Temp 97.9 F (36.6 C) (Oral)   Resp 19   Ht 5\' 4"  (1.626 m)   Wt 114 kg Comment: bedweight  LMP 08/03/2008 (Exact Date)   SpO2 96%   BMI 43.14 kg/m   Physical Exam: [id number] CVS: RRR now, on amio gtt Resp: clear Abd: soft Ext: 1+ anasarca  Labs: BMET Recent Labs  Lab 11/06/23 0520 11/09/23 1835 11/11/23 1320 11/12/23 0551  NA 135 132* 135 133*  K 3.9 3.8 3.4* 3.3*  CL 99 93* 92* 95*  CO2 22 25 25 26   GLUCOSE 131* 154* 175* 177*   BUN 45* 54* 43* 27*  CREATININE 8.25* 7.96* 6.12* 3.73*  CALCIUM 8.8* 9.4 9.2 8.9  PHOS 3.9 4.2 2.9 2.9   CBC Recent Labs  Lab 11/06/23 0634 11/09/23 1835 11/11/23 1320 11/12/23 0551  WBC 9.4 18.3* 22.6* 14.8*  NEUTROABS  --   --   --  13.0*  HGB 10.3* 8.9* 9.1* 8.7*  HCT 35.1* 29.4* 29.4* 29.0*  MCV 93.9 90.7 90.2 91.2  PLT 70* 112* 110* 107*      Medications:     atorvastatin  20 mg Oral Daily   bacitracin-polymyxin b   Left Eye BID   busPIRone  7.5 mg Oral BID   calcitRIOL  2 mcg Oral Q M,W,F   Chlorhexidine Gluconate Cloth  6 each Topical Q0600   cinacalcet  30 mg Oral Q M,W,F   [START ON 11/15/2023] collagenase   Topical Daily   darbepoetin (ARANESP) injection - DIALYSIS  100 mcg Subcutaneous Q Wed-1800   feeding supplement (NEPRO CARB STEADY)  237 mL Oral BID BM   liver oil-zinc oxide   Topical BID   mupirocin ointment   Nasal BID   nystatin   Topical TID   pantoprazole  80 mg Oral Daily   PARoxetine  20 mg Oral QHS   sodium hypochlorite   Irrigation BID   sucroferric oxyhydroxide  1,000 mg Oral TID WC     Leandra Pro MD 11/12/2023, 9:31 AM

## 2023-11-12 NOTE — Progress Notes (Signed)
 PHARMACY - ANTICOAGULATION  Pharmacy Consult for Heparin Indication: atrial fibrillation Brief A/P: aPTT within goal range Continue Heparin at current rate   Allergies  Allergen Reactions   Codeine Nausea And Vomiting   Tape Other (See Comments) and Rash    Pull skin off.  Paper tape is ok    Patient Measurements: Height: 5\' 4"  (162.6 cm) Weight: 114 kg (251 lb 5.2 oz) (bedweight) IBW/kg (Calculated) : 54.7 HEPARIN DW (KG): 89.4  Vital Signs: Temp: 97.7 F (36.5 C) (04/17 1946) Temp Source: Oral (04/17 1946) BP: 121/47 (04/17 2235) Pulse Rate: 89 (04/17 2235)  Labs: Recent Labs    11/11/23 1320 11/11/23 1441 11/11/23 1646 11/11/23 1646 11/12/23 0551 11/12/23 1439 11/12/23 2246  HGB 9.1*  --   --   --  8.7*  --   --   HCT 29.4*  --   --   --  29.0*  --   --   PLT 110*  --   --   --  107*  --   --   APTT  --   --  39*   < > 41* 49* 66*  HEPARINUNFRC  --   --  >1.10*  --  >1.10*  --   --   CREATININE 6.12*  --   --   --  3.73*  --   --   TROPONINIHS  --  330* 322*  --   --   --   --    < > = values in this interval not displayed.    Estimated Creatinine Clearance: 21.1 mL/min (A) (by C-G formula based on SCr of 3.73 mg/dL (H)).  Assessment: 58 y.o. female with h/o Afib, Eliquis on hold, for heparin  Goal of Therapy:  Heparin level 0.3-0.7 units/ml aPTT 66-102 seconds Monitor platelets by anticoagulation protocol: Yes   Plan:  No change to heparin  Claudine Cullens, PharmD, BCPS  11/12/2023,11:17 PM

## 2023-11-12 NOTE — Progress Notes (Signed)
 PHARMACY - ANTICOAGULATION CONSULT NOTE  Pharmacy Consult for Heparin Indication: atrial fibrillation  Allergies  Allergen Reactions   Codeine Nausea And Vomiting   Tape Other (See Comments) and Rash    Pull skin off.  Paper tape is ok    Patient Measurements: Height: 5\' 4"  (162.6 cm) Weight: 114 kg (251 lb 5.2 oz) (bedweight) IBW/kg (Calculated) : 54.7 HEPARIN DW (KG): 89.4  Vital Signs: Temp: 97.5 F (36.4 C) (04/17 1559) Temp Source: Axillary (04/17 1559) BP: 116/58 (04/17 1500) Pulse Rate: 94 (04/17 1300)  Labs: Recent Labs    11/09/23 1835 11/11/23 1320 11/11/23 1441 11/11/23 1646 11/12/23 0551 11/12/23 1439  HGB 8.9* 9.1*  --   --  8.7*  --   HCT 29.4* 29.4*  --   --  29.0*  --   PLT 112* 110*  --   --  107*  --   APTT  --   --   --  39* 41* 49*  HEPARINUNFRC  --   --   --  >1.10* >1.10*  --   CREATININE 7.96* 6.12*  --   --  3.73*  --   TROPONINIHS  --   --  330* 322*  --   --     Estimated Creatinine Clearance: 21.1 mL/min (A) (by C-G formula based on SCr of 3.73 mg/dL (H)).   Medical History: Past Medical History:  Diagnosis Date   Acid reflux    Anemia    Arthritis    Bipolar 1 disorder (HCC)    Blindness of left eye    left eye removed   Carpal tunnel syndrome, bilateral    Cervical radiculopathy    CHF (congestive heart failure) (HCC)    Constipation    COPD (chronic obstructive pulmonary disease) (HCC) 2014   Degenerative disc disease, thoracic    Depression    Diabetes mellitus    Type II   Diabetic retinopathy (HCC)    End stage kidney disease (HCC)    M, W, F [facility]   Gout    Hypertension    Non-alcoholic cirrhosis (HCC)    Noncompliance with medication regimen    Obesity (BMI 30-39.9)    OSA (obstructive sleep apnea)    cpap   Panic attack    RLS (restless legs syndrome)     Medications:  Medications Prior to Admission  Medication Sig Dispense Refill Last Dose/Taking   ADVAIR DISKUS 250-50 MCG/ACT AEPB Inhale  1 puff into the lungs 2 (two) times daily.   Past Week   albuterol (PROVENTIL) (2.5 MG/3ML) 0.083% nebulizer solution Take 3 mLs (2.5 mg total) by nebulization every 12 (twelve) hours as needed for wheezing or shortness of breath. 75 mL 0 Past Week   ALPRAZolam (XANAX) 1 MG tablet Take 1 tablet (1 mg total) by mouth 4 (four) times daily as needed for anxiety. (Patient taking differently: Take 1 mg by mouth 4 (four) times daily.) 9 tablet 0 11/01/2023   apixaban (ELIQUIS) 5 MG TABS tablet Take 2 tablets (10 mg total) by mouth 2 (two) times daily for 7 days, THEN 1 tablet (5 mg total) 2 (two) times daily for 23 days. 74 tablet 0 10/30/2023   Artificial Tear Ointment (DRY EYES OP) Place 1 drop into the right eye 2 (two) times daily.   11/01/2023   atorvastatin (LIPITOR) 20 MG tablet Take 20 mg by mouth daily.   11/01/2023   atropine (ISOPTO ATROPINE) 1 % ophthalmic solution Place 1 drop into  the left eye 3 (three) times daily.   11/01/2023   brimonidine (ALPHAGAN) 0.2 % ophthalmic solution Place 1 drop into the left eye 3 (three) times daily.   11/01/2023   busPIRone (BUSPAR) 7.5 MG tablet Take 1 tablet (7.5 mg total) by mouth 2 (two) times daily. 7 tablet 0 11/01/2023   cinacalcet (SENSIPAR) 60 MG tablet Take 60 mg by mouth daily.   11/01/2023   colchicine 0.6 MG tablet TAKE 1 TABLET(0.6 MG) BY MOUTH DAILY 30 tablet 0 11/01/2023   doxercalciferol (HECTOROL) 4 MCG/2ML injection Inject 0.5 mLs (1 mcg total) into the vein every Monday, Wednesday, and Friday with hemodialysis. 2 mL  10/30/2023   gabapentin (NEURONTIN) 100 MG capsule Take 1 capsule (100 mg total) by mouth at bedtime. (Patient taking differently: Take 300 mg by mouth at bedtime.) 7 capsule 0 11/01/2023   HYDROcodone-acetaminophen (NORCO) 10-325 MG tablet Take 1 tablet by mouth 4 (four) times daily.   11/01/2023   insulin glargine (LANTUS SOLOSTAR) 100 UNIT/ML Solostar Pen Inject 30 Units into the skin daily.   11/01/2023   latanoprost (XALATAN) 0.005 % ophthalmic  solution Place 1 drop into the left eye 2 (two) times daily.   11/01/2023   omeprazole (PRILOSEC) 40 MG capsule Take 40 mg by mouth daily.   11/01/2023   ondansetron (ZOFRAN) 4 MG tablet TAKE (1) TABLET THREE TIMES DAILY AS NEEDED FOR NAUSEA.   11/01/2023   PARoxetine (PAXIL) 20 MG tablet Take 1 tablet (20 mg total) by mouth every evening. This is to prevent panic attacks (Patient taking differently: Take 20 mg by mouth at bedtime.) 7 tablet 0 11/01/2023   VELPHORO 500 MG chewable tablet Chew 1,000 mg by mouth 3 (three) times daily.   11/01/2023   Blood Glucose Monitoring Suppl (FORA V30A BLOOD GLUCOSE SYSTEM) DEVI       FORACARE PREMIUM V10 TEST test strip        Assessment: Patient presented with buttock cellulitis. ON chronic anticoagulation with eliquis for afib. Patient having rhythm changes and MD concerned. Diltiazem infusion started, cardiology to evaluate. MD asked pharmacy to transition to heparin for now. Will follow APTT and HL until correlates. Last dose of eliquis 4/16 at 0830  APTT 41> 49, subtherapeutic. No issues with infusion per RN. Will adjust heparin rate No bolus, apixaban yesterday  Goal of Therapy:  Heparin level 0.3-0.7 units/ml aPTT 66-102 seconds Monitor platelets by anticoagulation protocol: Yes   Plan:  Increase heparin infusion at 1750  units/hr Check APTT level in 6-8 hours and APTT and anti-xa daily while on heparin Continue to monitor H&H and platelets F/U transition back to apixaban  Paislei Dorval, BS Pharm D, BCPS Clinical Pharmacist 11/12/2023,4:19 PM

## 2023-11-12 NOTE — Progress Notes (Signed)
 PROGRESS NOTE    Martha [name]  ZOX:[id number] DOB: 12-20-1965 DOA: 11/03/2023 PCP: Carmel Sacramento, NP   Brief Narrative:    The patient is a 58 y.o. female with medical history significant of  ESRD on HD (MWF) COPD, hypertension, NASH, AOCD, left eye blindness, type 2 diabetes mellitus with retinopathy, RLS, CHF who presents to the emergency department due to lower abdominal pain, nausea, NBNB vomiting.  She is found to have buttock cellulitis and was initiated on antibiotics and her fever has resolved and leukocytosis is improving.  CT scan of pelvis demonstrated no drainable abscess.  She was undergoing hemodialysis on 4/16 and went into atrial fibrillation with RVR and was transferred to ICU on heparin drip with possible need for amiodarone per cardiology.  Assessment & Plan:   Principal Problem:   Acute febrile illness Active Problems:   GERD   Morbid obesity (HCC)   RESTLESS LEG SYNDROME   OSA on CPAP   Abdominal pain   Nausea & vomiting   Lactic acidosis   Thrombocytopenia (HCC)   Cellulitis of right buttock  Assessment and Plan:   Buttock Cellulitis:  Initially presented with fever, started empirically on vancomycin and Zosyn. Fever had resolved so antibiotics were discontinued but  Started having drainage from right buttock, general surgery was consulted for possible abscess.  CT of the pelvis showed no drainable abscess but showed cellulitis. Restarted vancomycin and cefepime. May need to re-image given worsening drainage and WBC. WBC Trend: -Wound care RN consulted.  Lactic acid level was not elevated again but procalcitonin was 6.07.   - Repeat general surgery evaluation due to worsening wound, discussed with Dr. Vevelyn Pat   New Onset A Fib w/RVR / Elevated Troponin: Checked EKG and showed atrial fibrillation this tracing as last tracing on 8 Place showed normal sinus rhythm.  Changed her Eliquis to heparin drip.  Troponin was elevated at 330.  CHA2DS2-VASc score is at  least 3-4.  Recent echocardiogram done a few weeks ago and showed normal LVEF.  Initiated on Cardizem drip but then became extremely hypotensive so this was discontinued and she had to begin fluids and albumin.   - Appreciate cardiology recommendations with IV amiodarone infusion, plan to switch to oral amiodarone by a.m. plans are for DOAC on discharge   Acute encephalopathy/hypersomnolence:  Likely in setting of opioid overdose. ABG showed pCO2 42 yesterday; CT head showed No evidence of acute intracranial abnormality. Resolved after getting IV Narcan. Will discontinue oxycodone at this time. Will continue with only Tylenol as needed for now -Careful use of IV Dilaudid for pain management   Pubic symphysis inflammation:  CT pelvis shows erosive changes at the pubic symphysis likely from inflammatory process versus osteitis versus infection. 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 Nephrology was consulted for maintenance dialysis in the morning per EDP. Patient has been missing hemodialysis due to pain, nephrology recommending palliative care consult for goals of care. Palliative care consulted, continue dialysis with next session/18   Lactic acidosis /Elevated anion gap: Lactic acid 3.8 > 2.4 and now improved to 1.0; anion gap 18. This is possibly related to several missed dialysis sessions Nephrology consulted for maintenance dialysis.  On broad-spectrum  antibiotics as above   Hypertension -> Hypotension: Blood pressure had been elevated but then dropped in the setting of her A-fib with RVR and diltiazem drip.  Hold antihypertensive and was given albumin 25 g and IV fluid bolus.  To monitor  blood pressures per protocol.  If not improving may need pressors.  Continue to monitor blood pressures per protocol.  Last blood pressure reading was 113/58 but did drop as low as 74/22   History of Calciphylaxis: Continue sensipar as well as Hectorol    Thrombocytopenia:  -Stable, continue to monitor CBC -Likely due to splenic sequestration and infection.  Continue with broad-spectrum antibiotics as above   Splenic Infarct: Hold Eliquis and change to Heparin gtt given A Fib w/ RVR and elevated Troponin   OSA: Continue CPAP.     Restless Leg Syndrome: No longer on Gabapentin   GERD/GI Prophylaxis: C/w PPI with Pantoprazole 80 mg po Daily   Depression, Anxiety, Bipolar Disorder: C/w Buspirone 7.5 mg po BID and Paroxetine 20 mg po at bedtime   Normocytic Anemia/Anemia of Chronic Kidney Disease: Hemoglobin/hematocrit is now 9.1/29.4.  Continue with darbepoetin alpha 100 mcg subcu every Wednesday and continue monitor for signs or symptoms of bleeding given that she is now being anticoag with heparin drip.  Check anemia panel in AM.  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  Started on triple antibiotic ophthalmic ointment twice daily   Hypoalbuminemia: Patient's Albumin trending down and now 2.1 on last check. CTM and Trend and repeat CMP in the AM   Class III (Morbid) Obesity: Complicates overall prognosis and care -Estimated body mass index is 40.16 kg/m as calculated from the following:   Height as of this encounter: 5\' 8"  (1.727 m).   Weight as of this encounter: 119.8 kg. Weight Loss and Dietary Counseling given  New concern for UTI -Continue current antibiotics and send off urine culture    DVT prophylaxis:Heparin drip Code Status: Full Family Communication: None at bedside Disposition  Plan:  Status is: Inpatient Remains inpatient appropriate because: Need for IV medications.   Consultants:  Cardiology Nephrology General Surgery Palliative care medicine  Procedures:  As above  Antimicrobials:  Anti-infectives (From admission, onward)    Start     Dose/Rate Route Frequency Ordered Stop   11/09/23 1600  ceFEPIme (MAXIPIME) 2 g in sodium chloride 0.9 % 100 mL IVPB        2 g 200 mL/hr over 30 Minutes Intravenous Every M-W-F (Hemodialysis) 11/08/23 0854     11/09/23 1600  vancomycin (VANCOCIN) IVPB 1000 mg/200 mL premix        1,000 mg 200 mL/hr over 60 Minutes Intravenous Every M-W-F (Hemodialysis) 11/08/23 0855     11/08/23 0930  vancomycin (VANCOCIN) IVPB 1000 mg/200 mL premix        1,000 mg 200 mL/hr over 60 Minutes Intravenous  Once 11/08/23 0824 11/08/23 1201   11/08/23 0900  ceFEPIme (MAXIPIME) 2 g in sodium chloride 0.9 % 100 mL IVPB        2 g 200 mL/hr over 30 Minutes Intravenous  Once 11/08/23 0804 11/08/23 1008   11/05/23 1130  piperacillin-tazobactam (ZOSYN) 2.25 g in sodium chloride 0.9 % 50 mL IVPB  Status:  Discontinued        2.25 g 100 mL/hr over  30 Minutes Intravenous Every 8 hours 11/05/23 1054 11/05/23 1127   11/04/23 1200  vancomycin (VANCOCIN) IVPB 1000 mg/200 mL premix  Status:  Discontinued        1,000 mg 200 mL/hr over 60 Minutes Intravenous Every M-W-F (Hemodialysis) 11/04/23 0250 11/05/23 1127   11/04/23 0600  piperacillin-tazobactam (ZOSYN) IVPB 2.25 g  Status:  Discontinued        2.25 g 100 mL/hr over 30 Minutes Intravenous Every 8 hours 11/04/23 0250 11/05/23 1054   11/03/23 2315  vancomycin (VANCOREADY) IVPB 2000 mg/400 mL        2,000 mg 200 mL/hr over 120 Minutes Intravenous  Once 11/03/23 2300 11/04/23 0229   11/03/23 2315  piperacillin-tazobactam (ZOSYN) IVPB 3.375 g        3.375 g 100 mL/hr over 30 Minutes Intravenous  Once 11/03/23 2300 11/04/23 0024      Subjective: Patient seen and evaluated today with no new  acute complaints or concerns. No acute concerns or events noted overnight.  Objective: Vitals:   11/12/23 0430 11/12/23 0500 11/12/23 0530 11/12/23 0600  BP: (!) 119/58 (!) 149/74 (!) 148/104 (!) 127/58  Pulse: 86 90  (!) 103  Resp: 19 18 (!) 21 18  Temp:      TempSrc:      SpO2: 97% 98%  95%  Weight:      Height:        Intake/Output Summary (Last 24 hours) at 11/12/2023 0650 Last data filed at 11/12/2023 0300 Gross per 24 hour  Intake 866.31 ml  Output 500 ml  Net 366.31 ml   Filed Weights   11/11/23 1301 11/11/23 1812  Weight: 117 kg 114 kg    Examination:  General exam: Appears calm and comfortable, somnolent but arousable Respiratory system: Clear to auscultation. Respiratory effort normal.  4 L nasal cannula Cardiovascular system: S1 & S2 heard, irregular. Gastrointestinal system: Abdomen is soft Central nervous system: Alert and awake Extremities: No edema Skin: Wound as noted in chart Psychiatry: Flat affect.    Data Reviewed: I have personally reviewed following labs and imaging studies  CBC: Recent Labs  Lab 11/06/23 0634 11/09/23 1835 11/11/23 1320 11/12/23 0551  WBC 9.4 18.3* 22.6* 14.8*  NEUTROABS  --   --   --  13.0*  HGB 10.3* 8.9* 9.1* 8.7*  HCT 35.1* 29.4* 29.4* 29.0*  MCV 93.9 90.7 90.2 91.2  PLT 70* 112* 110* 107*   Basic Metabolic Panel: Recent Labs  Lab 11/06/23 0520 11/09/23 1835 11/11/23 1320 11/12/23 0551  NA 135 132* 135 133*  K 3.9 3.8 3.4* 3.3*  CL 99 93* 92* 95*  CO2 22 25 25 26   GLUCOSE 131* 154* 175* 177*  BUN 45* 54* 43* 27*  CREATININE 8.25* 7.96* 6.12* 3.73*  CALCIUM 8.8* 9.4 9.2 8.9  MG  --   --   --  1.9  PHOS 3.9 4.2 2.9 2.9   GFR: Estimated Creatinine Clearance: 21.1 mL/min (A) (by C-G formula based on SCr of 3.73 mg/dL (H)). Liver Function Tests: Recent Labs  Lab 11/06/23 0520 11/09/23 1835 11/11/23 1320 11/12/23 0551  AST  --   --   --  10*  ALT  --   --   --  9  ALKPHOS  --   --   --  75   BILITOT  --   --   --  1.4*  PROT  --   --   --  5.6*  ALBUMIN 2.5* 2.1* 1.9* 2.2*  No results for input(s): "LIPASE", "AMYLASE" in the last 168 hours. No results for input(s): "AMMONIA" in the last 168 hours. Coagulation Profile: No results for input(s): "INR", "PROTIME" in the last 168 hours. Cardiac Enzymes: No results for input(s): "CKTOTAL", "CKMB", "CKMBINDEX", "TROPONINI" in the last 168 hours. BNP (last 3 results) No results for input(s): "PROBNP" in the last 8760 hours. HbA1C: No results for input(s): "HGBA1C" in the last 72 hours. CBG: No results for input(s): "GLUCAP" in the last 168 hours. Lipid Profile: No results for input(s): "CHOL", "HDL", "LDLCALC", "TRIG", "CHOLHDL", "LDLDIRECT" in the last 72 hours. Thyroid Function Tests: Recent Labs    11/11/23 1441  TSH 4.577*   Anemia Panel: No results for input(s): "VITAMINB12", "FOLATE", "FERRITIN", "TIBC", "IRON", "RETICCTPCT" in the last 72 hours. Sepsis Labs: Recent Labs  Lab 11/11/23 1646 11/11/23 1902  PROCALCITON 6.07  --   LATICACIDVEN 1.0 1.1    Recent Results (from the past 240 hours)  Resp panel by RT-PCR (RSV, Flu A&B, Covid) Anterior Nasal Swab     Status: None   Collection Time: 11/03/23  4:54 PM   Specimen: Anterior Nasal Swab  Result Value Ref Range Status   SARS Coronavirus 2 by RT PCR NEGATIVE NEGATIVE Final    Comment: (NOTE) SARS-CoV-2 target nucleic acids are NOT DETECTED.  The SARS-CoV-2 RNA is generally detectable in upper respiratory specimens during the acute phase of infection. The lowest concentration of SARS-CoV-2 viral copies this assay can detect is 138 copies/mL. A negative result does not preclude SARS-Cov-2 infection and should not be used as the [name] basis for treatment or other patient management decisions. A negative result may occur with  improper specimen collection/handling, submission of specimen other than nasopharyngeal swab, presence of viral mutation(s) within  the areas targeted by this assay, and inadequate number of viral copies(<138 copies/mL). A negative result must be combined with clinical observations, patient history, and epidemiological information. The expected result is Negative.  Fact Sheet for Patients:  BloggerCourse.com  Fact Sheet for Healthcare Providers:  SeriousBroker.it  This test is no t yet approved or cleared by the [place]  FDA and  has been authorized for detection and/or diagnosis of SARS-CoV-2 by FDA under an Emergency Use Authorization (EUA). This EUA will remain  in effect (meaning this test can be used) for the duration of the COVID-19 declaration under Section 564(b)(1) of the Act, 21 U.S.C.section 360bbb-3(b)(1), unless the authorization is terminated  or revoked sooner.       Influenza A by PCR NEGATIVE NEGATIVE Final   Influenza B by PCR NEGATIVE NEGATIVE Final    Comment: (NOTE) The Xpert Xpress SARS-CoV-2/FLU/RSV plus assay is intended as an aid in the diagnosis of influenza from Nasopharyngeal swab specimens and should not be used as a [name] basis for treatment. Nasal washings and aspirates are unacceptable for Xpert Xpress SARS-CoV-2/FLU/RSV testing.  Fact Sheet for Patients: BloggerCourse.com  Fact Sheet for Healthcare Providers: SeriousBroker.it  This test is not yet approved or cleared by the [place]  FDA and has been authorized for detection and/or diagnosis of SARS-CoV-2 by FDA under an Emergency Use Authorization (EUA). This EUA will remain in effect (meaning this test can be used) for the duration of the COVID-19 declaration under Section 564(b)(1) of the Act, 21 U.S.C. section 360bbb-3(b)(1), unless the authorization is terminated or revoked.     Resp Syncytial Virus by PCR NEGATIVE NEGATIVE Final    Comment: (NOTE) Fact Sheet for  Patients: BloggerCourse.com  Fact Sheet for Healthcare  Providers: SeriousBroker.it  This test is not yet approved or cleared by the [place]  FDA and has been authorized for detection and/or diagnosis of SARS-CoV-2 by FDA under an Emergency Use Authorization (EUA). This EUA will remain in effect (meaning this test can be used) for the duration of the COVID-19 declaration under Section 564(b)(1) of the Act, 21 U.S.C. section 360bbb-3(b)(1), unless the authorization is terminated or revoked.  Performed at [facility], [address] [place], [place] [address]   Blood Culture (routine x 2)     Status: None   Collection Time: 11/03/23  5:14 PM   Specimen: BLOOD  Result Value Ref Range Status   Specimen Description BLOOD RIGHT ANTECUBITAL  Final   Special Requests   Final    BOTTLES DRAWN AEROBIC AND ANAEROBIC Blood Culture results may not be optimal due to an inadequate volume of blood received in culture bottles   Culture   Final    NO GROWTH 5 DAYS Performed at [facility], [address] [place], [place] [address]    Report Status 11/08/2023 FINAL  Final  Respiratory (~20 pathogens) panel by PCR     Status: None   Collection Time: 11/03/23  7:19 PM   Specimen: Nasopharyngeal Swab; Respiratory  Result Value Ref Range Status   Adenovirus NOT DETECTED NOT DETECTED Final   Coronavirus 229E NOT DETECTED NOT DETECTED Final    Comment: (NOTE) The Coronavirus on the Respiratory Panel, DOES NOT test for the novel  Coronavirus (2019 nCoV)    Coronavirus HKU1 NOT DETECTED NOT DETECTED Final   Coronavirus NL63 NOT DETECTED NOT DETECTED Final   Coronavirus OC43 NOT DETECTED NOT DETECTED Final   Metapneumovirus NOT DETECTED NOT DETECTED Final   Rhinovirus / Enterovirus NOT DETECTED NOT DETECTED Final   Influenza A NOT DETECTED NOT DETECTED Final   Influenza B NOT DETECTED NOT DETECTED Final   Parainfluenza Virus 1 NOT DETECTED NOT  DETECTED Final   Parainfluenza Virus 2 NOT DETECTED NOT DETECTED Final   Parainfluenza Virus 3 NOT DETECTED NOT DETECTED Final   Parainfluenza Virus 4 NOT DETECTED NOT DETECTED Final   Respiratory Syncytial Virus NOT DETECTED NOT DETECTED Final   Bordetella pertussis NOT DETECTED NOT DETECTED Final   Bordetella Parapertussis NOT DETECTED NOT DETECTED Final   Chlamydophila pneumoniae NOT DETECTED NOT DETECTED Final   Mycoplasma pneumoniae NOT DETECTED NOT DETECTED Final    Comment: Performed at [facility], [address], [place], [place] [address]  MRSA Next Gen by PCR, Nasal     Status: Abnormal   Collection Time: 11/05/23 12:00 PM   Specimen: Nasal Mucosa; Nasal Swab  Result Value Ref Range Status   MRSA by PCR Next Gen DETECTED (A) NOT DETECTED Final    Comment: RESULT CALLED TO, READ BACK BY AND VERIFIED WITH: D HAIRSTON AT 1356 11/05/23 BY A WILSON (NOTE) The GeneXpert MRSA Assay (FDA approved for NASAL specimens only), is one component of a comprehensive MRSA colonization surveillance program. It is not intended to diagnose MRSA infection nor to guide or monitor treatment for MRSA infections. Test performance is not FDA approved in patients less than 14 years old. Performed at [facility], [address], [place], [place] [address]   MRSA Next Gen by PCR, Nasal     Status: Abnormal   Collection Time: 11/11/23  2:06 PM   Specimen: Nasal Mucosa; Nasal Swab  Result Value Ref Range Status   MRSA by PCR Next Gen DETECTED (A) NOT  DETECTED Final    Comment: RESULT CALLED TO, READ BACK BY AND VERIFIED WITH: JENNIFER KING @ 1731 ON 11/11/23 C VARNER (NOTE) The GeneXpert MRSA Assay (FDA approved for NASAL specimens only), is one component of a comprehensive MRSA colonization surveillance program. It is not intended to diagnose MRSA infection nor to guide or monitor treatment for MRSA infections. Test performance is not FDA approved in patients less than 70  years old. Performed at [facility], [address], [place], [place] [address]          Radiology Studies: CT HEAD WO CONTRAST ( ) Result Date: 11/10/2023 CLINICAL DATA:  Mental status change, unknown cause. EXAM: CT HEAD WITHOUT CONTRAST TECHNIQUE: Contiguous axial images were obtained from the base of the skull through the vertex without intravenous contrast. RADIATION DOSE REDUCTION: This exam was performed according to the departmental dose-optimization program which includes automated exposure control, adjustment of the mA and/or kV according to patient size and/or use of iterative reconstruction technique. COMPARISON:  CT orbits October 04, 2019.  CT head Nov 29, 2018. FINDINGS: Brain: No evidence of acute infarction, hemorrhage, hydrocephalus, extra-axial collection or mass lesion/mass effect. Vascular: No hyperdense vessel.  Calcific atherosclerosis. Skull: No acute fracture. Sinuses/Orbits: Stable chronic postoperative changes of the globes. Clear sinuses. Remote right medial orbital wall fracture. IMPRESSION: No evidence of acute intracranial abnormality. Electronically Signed   By: Stevenson Elbe M.D.   On: 11/10/2023 19:14        Scheduled Meds:  atorvastatin  20 mg Oral Daily   bacitracin-polymyxin b   Left Eye BID   busPIRone  7.5 mg Oral BID   calcitRIOL  2 mcg Oral Q M,W,F   Chlorhexidine Gluconate Cloth  6 each Topical Q0600   cinacalcet  30 mg Oral Q M,W,F   darbepoetin (ARANESP) injection - DIALYSIS  100 mcg Subcutaneous Q Wed-1800   feeding supplement (NEPRO CARB STEADY)  237 mL Oral BID BM   leptospermum manuka honey  1 Application Topical Daily   liver oil-zinc oxide   Topical BID   mupirocin ointment   Nasal BID   nystatin   Topical TID   pantoprazole  80 mg Oral Daily   PARoxetine  20 mg Oral QHS   sucroferric oxyhydroxide  1,000 mg Oral TID WC   Continuous Infusions:  amiodarone 30 mg/hr (11/12/23 0300)   ceFEPime (MAXIPIME) IV Stopped (11/11/23 1852)    heparin 1,250 Units/hr (11/12/23 0300)   vancomycin Stopped (11/11/23 1742)     LOS: 9 days    Time spent: 55 minutes    Martha Tweten D Mason Sole, DO [facility]  If 7PM-7AM, please contact night-coverage [email or web address] 11/12/2023, 6:50 AM

## 2023-11-12 NOTE — Progress Notes (Signed)
 PT Cancellation Note  Patient Details Name: QUINNLEY COLASURDO MRN: [id number] DOB: 01/23/1966   Cancelled Treatment:    Reason Eval/Treat Not Completed: Medical issues which prohibited therapy.  Patient transferred to a higher level of care and will need new PT consult to resume therapy when patient is medically stable.  Thank you.    7:34 AM, 11/12/23 Walton Guppy, MPT Physical Therapist with [facility] 336 [phone] office [phone] mobile phone

## 2023-11-12 NOTE — Progress Notes (Signed)
 OT Cancellation Note  Patient Details Name: Martha [name] MRN: [id number] DOB: November 09, 1965   Cancelled Treatment:    Reason Eval/Treat Not Completed: Medical issues which prohibited therapy.  Patient transferred to a higher level of care and will need new OT consult to resume therapy when patient is medically stable.  Thank you.   Jaris Kohles OT, MOT   [name] 11/12/2023, 8:02 AM

## 2023-11-12 NOTE — Progress Notes (Signed)
 PHARMACY - ANTICOAGULATION CONSULT NOTE  Pharmacy Consult for Heparin Indication: atrial fibrillation  Allergies  Allergen Reactions   Codeine Nausea And Vomiting   Tape Other (See Comments) and Rash    Pull skin off.  Paper tape is ok    Patient Measurements: Height: 5\' 4"  (162.6 cm) Weight: 114 kg (251 lb 5.2 oz) (bedweight) IBW/kg (Calculated) : 54.7 HEPARIN DW (KG): 89.4  Vital Signs: Temp: 97.8 F (36.6 C) (04/17 0300) Temp Source: Oral (04/17 0300) BP: 148/61 (04/17 0706) Pulse Rate: 110 (04/17 0706)  Labs: Recent Labs    11/09/23 1835 11/11/23 1320 11/11/23 1441 11/11/23 1646 11/12/23 0551  HGB 8.9* 9.1*  --   --  8.7*  HCT 29.4* 29.4*  --   --  29.0*  PLT 112* 110*  --   --  107*  APTT  --   --   --  39* 41*  HEPARINUNFRC  --   --   --  >1.10* >1.10*  CREATININE 7.96* 6.12*  --   --  3.73*  TROPONINIHS  --   --  330* 322*  --     Estimated Creatinine Clearance: 21.1 mL/min (A) (by C-G formula based on SCr of 3.73 mg/dL (H)).   Medical History: Past Medical History:  Diagnosis Date   Acid reflux    Anemia    Arthritis    Bipolar 1 disorder (HCC)    Blindness of left eye    left eye removed   Carpal tunnel syndrome, bilateral    Cervical radiculopathy    CHF (congestive heart failure) (HCC)    Constipation    COPD (chronic obstructive pulmonary disease) (HCC) 2014   Degenerative disc disease, thoracic    Depression    Diabetes mellitus    Type II   Diabetic retinopathy (HCC)    End stage kidney disease (HCC)    M, W, F [facility]   Gout    Hypertension    Non-alcoholic cirrhosis (HCC)    Noncompliance with medication regimen    Obesity (BMI 30-39.9)    OSA (obstructive sleep apnea)    cpap   Panic attack    RLS (restless legs syndrome)     Medications:  Medications Prior to Admission  Medication Sig Dispense Refill Last Dose/Taking   ADVAIR DISKUS 250-50 MCG/ACT AEPB Inhale 1 puff into the lungs 2 (two) times daily.   Past  Week   albuterol (PROVENTIL) (2.5 MG/3ML) 0.083% nebulizer solution Take 3 mLs (2.5 mg total) by nebulization every 12 (twelve) hours as needed for wheezing or shortness of breath. 75 mL 0 Past Week   ALPRAZolam (XANAX) 1 MG tablet Take 1 tablet (1 mg total) by mouth 4 (four) times daily as needed for anxiety. (Patient taking differently: Take 1 mg by mouth 4 (four) times daily.) 9 tablet 0 11/01/2023   apixaban (ELIQUIS) 5 MG TABS tablet Take 2 tablets (10 mg total) by mouth 2 (two) times daily for 7 days, THEN 1 tablet (5 mg total) 2 (two) times daily for 23 days. 74 tablet 0 10/30/2023   Artificial Tear Ointment (DRY EYES OP) Place 1 drop into the right eye 2 (two) times daily.   11/01/2023   atorvastatin (LIPITOR) 20 MG tablet Take 20 mg by mouth daily.   11/01/2023   atropine (ISOPTO ATROPINE) 1 % ophthalmic solution Place 1 drop into the left eye 3 (three) times daily.   11/01/2023   brimonidine (ALPHAGAN) 0.2 % ophthalmic solution Place 1 drop  into the left eye 3 (three) times daily.   11/01/2023   busPIRone (BUSPAR) 7.5 MG tablet Take 1 tablet (7.5 mg total) by mouth 2 (two) times daily. 7 tablet 0 11/01/2023   cinacalcet (SENSIPAR) 60 MG tablet Take 60 mg by mouth daily.   11/01/2023   colchicine 0.6 MG tablet TAKE 1 TABLET(0.6 MG) BY MOUTH DAILY 30 tablet 0 11/01/2023   doxercalciferol (HECTOROL) 4 MCG/2ML injection Inject 0.5 mLs (1 mcg total) into the vein every Monday, Wednesday, and Friday with hemodialysis. 2 mL  10/30/2023   gabapentin (NEURONTIN) 100 MG capsule Take 1 capsule (100 mg total) by mouth at bedtime. (Patient taking differently: Take 300 mg by mouth at bedtime.) 7 capsule 0 11/01/2023   HYDROcodone-acetaminophen (NORCO) 10-325 MG tablet Take 1 tablet by mouth 4 (four) times daily.   11/01/2023   insulin glargine (LANTUS SOLOSTAR) 100 UNIT/ML Solostar Pen Inject 30 Units into the skin daily.   11/01/2023   latanoprost (XALATAN) 0.005 % ophthalmic solution Place 1 drop into the left eye 2 (two) times  daily.   11/01/2023   omeprazole (PRILOSEC) 40 MG capsule Take 40 mg by mouth daily.   11/01/2023   ondansetron (ZOFRAN) 4 MG tablet TAKE (1) TABLET THREE TIMES DAILY AS NEEDED FOR NAUSEA.   11/01/2023   PARoxetine (PAXIL) 20 MG tablet Take 1 tablet (20 mg total) by mouth every evening. This is to prevent panic attacks (Patient taking differently: Take 20 mg by mouth at bedtime.) 7 tablet 0 11/01/2023   VELPHORO 500 MG chewable tablet Chew 1,000 mg by mouth 3 (three) times daily.   11/01/2023   Blood Glucose Monitoring Suppl (FORA V30A BLOOD GLUCOSE SYSTEM) DEVI       FORACARE PREMIUM V10 TEST test strip        Assessment: Patient presented with buttock cellulitis. ON chronic anticoagulation with eliquis for afib. Patient having rhythm changes and MD concerned. Diltiazem infusion started, cardiology to evaluate. MD asked pharmacy to transition to heparin for now. Will follow APTT and HL until correlates. Last dose of eliquis 4/16 at 0830  APTT 41, subtherapeutic. No issues with infusion per RN. Will adjust heparin rate No bolus, apixaban yesterday  Goal of Therapy:  Heparin level 0.3-0.7 units/ml aPTT 66-102 seconds Monitor platelets by anticoagulation protocol: Yes   Plan:  Increase heparin infusion at 1500  units/hr Check APTT and anti-Xa level in 6-8 hours and daily while on heparin Continue to monitor H&H and platelets F/U transition back to apixaban  Sanjana Folz, BS Pharm D, BCPS Clinical Pharmacist 11/12/2023,7:45 AM

## 2023-11-12 NOTE — Progress Notes (Signed)
 Daughter says patient produces some urine varying from a lot to a little amounts.  Attempted to place foley - 2 RNs tried with no success due abnormal or bad anatomy.    Her wounds are infected and protruding more wound care made aware.  She needs Urology possibly to place.    1st shift RN aware.

## 2023-11-12 NOTE — Plan of Care (Addendum)
 Discussed in front of patient plan of care for the evening, pain management and bedtime medications with no evidence of learning at this time.  Encouraged to wear CPAP for breathing at bedtime; attempted twice with no success.    Patient continually throughout the night yelling out and removing clothes.  Patient has yet to touch IV medication lines due to being wrapped at this time.  Problem: Education: Goal: Knowledge of General Education information will improve Description: Including pain rating scale, medication(s)/side effects and non-pharmacologic comfort measures Outcome: Not Progressing   Problem: Health Behavior/Discharge Planning: Goal: Ability to manage health-related needs will improve Outcome: Not Progressing

## 2023-11-13 DIAGNOSIS — I5A Non-ischemic myocardial injury (non-traumatic): Secondary | ICD-10-CM | POA: Diagnosis not present

## 2023-11-13 DIAGNOSIS — R509 Fever, unspecified: Secondary | ICD-10-CM | POA: Diagnosis not present

## 2023-11-13 DIAGNOSIS — G9340 Encephalopathy, unspecified: Secondary | ICD-10-CM | POA: Diagnosis not present

## 2023-11-13 DIAGNOSIS — L03317 Cellulitis of buttock: Secondary | ICD-10-CM | POA: Diagnosis not present

## 2023-11-13 DIAGNOSIS — I4891 Unspecified atrial fibrillation: Secondary | ICD-10-CM | POA: Diagnosis not present

## 2023-11-13 LAB — CBC
HCT: 29.5 % — ABNORMAL LOW (ref 36.0–46.0)
Hemoglobin: 8.9 g/dL — ABNORMAL LOW (ref 12.0–15.0)
MCH: 27.3 pg (ref 26.0–34.0)
MCHC: 30.2 g/dL (ref 30.0–36.0)
MCV: 90.5 fL (ref 80.0–100.0)
Platelets: 111 10*3/uL — ABNORMAL LOW (ref 150–400)
RBC: 3.26 MIL/uL — ABNORMAL LOW (ref 3.87–5.11)
RDW: 18.5 % — ABNORMAL HIGH (ref 11.5–15.5)
WBC: 14.2 10*3/uL — ABNORMAL HIGH (ref 4.0–10.5)
nRBC: 0.2 % (ref 0.0–0.2)

## 2023-11-13 LAB — COMPREHENSIVE METABOLIC PANEL WITH GFR
ALT: 8 U/L (ref 0–44)
AST: 11 U/L — ABNORMAL LOW (ref 15–41)
Albumin: 2.1 g/dL — ABNORMAL LOW (ref 3.5–5.0)
Alkaline Phosphatase: 75 U/L (ref 38–126)
Anion gap: 11 (ref 5–15)
BUN: 35 mg/dL — ABNORMAL HIGH (ref 6–20)
CO2: 27 mmol/L (ref 22–32)
Calcium: 8.9 mg/dL (ref 8.9–10.3)
Chloride: 95 mmol/L — ABNORMAL LOW (ref 98–111)
Creatinine, Ser: 4.73 mg/dL — ABNORMAL HIGH (ref 0.44–1.00)
GFR, Estimated: 10 mL/min — ABNORMAL LOW (ref >60)
Glucose, Bld: 162 mg/dL — ABNORMAL HIGH (ref 70–99)
Potassium: 3.6 mmol/L (ref 3.5–5.1)
Sodium: 133 mmol/L — ABNORMAL LOW (ref 135–145)
Total Bilirubin: 0.8 mg/dL (ref 0.0–1.2)
Total Protein: 5.6 g/dL — ABNORMAL LOW (ref 6.5–8.1)

## 2023-11-13 LAB — MAGNESIUM: Magnesium: 1.9 mg/dL (ref 1.7–2.4)

## 2023-11-13 LAB — APTT
aPTT: 58 s — ABNORMAL HIGH (ref 24–36)
aPTT: 47 s — ABNORMAL HIGH (ref 24–36)

## 2023-11-13 LAB — HEPARIN LEVEL (UNFRACTIONATED): Heparin Unfractionated: >1.1 [IU]/mL — ABNORMAL HIGH (ref 0.30–0.70)

## 2023-11-13 MED ORDER — HEPARIN BOLUS VIA INFUSION
2600.0000 [IU] | Freq: Once | INTRAVENOUS | Status: AC
  Administered 2023-11-13: 2600 [IU] via INTRAVENOUS
  Filled 2023-11-13: qty 2600

## 2023-11-13 MED ORDER — ALBUMIN HUMAN 25 % IV SOLN: 25.0000 g | Freq: Once | INTRAVENOUS | Status: AC

## 2023-11-13 MED ORDER — AMIODARONE HCL 200 MG PO TABS
200.0000 mg | ORAL_TABLET | Freq: Two times a day (BID) | ORAL | Status: DC
  Administered 2023-11-13 – 2023-11-16 (×8): 200 mg via ORAL
  Filled 2023-11-13 (×9): qty 1

## 2023-11-13 MED ORDER — ALBUMIN HUMAN 25 % IV SOLN
INTRAVENOUS | Status: AC
  Administered 2023-11-13: 25 g via INTRAVENOUS
  Filled 2023-11-13: qty 100

## 2023-11-13 MED ORDER — HEPARIN BOLUS VIA INFUSION
1300.0000 [IU] | Freq: Once | INTRAVENOUS | Status: AC
Start: 2023-11-13 — End: 2023-11-13
  Administered 2023-11-13: 1300 [IU] via INTRAVENOUS
  Filled 2023-11-13: qty 1300

## 2023-11-13 MED ORDER — METOPROLOL TARTRATE 25 MG PO TABS
25.0000 mg | ORAL_TABLET | Freq: Four times a day (QID) | ORAL | Status: DC
  Administered 2023-11-13 – 2023-11-17 (×10): 25 mg via ORAL
  Filled 2023-11-13 (×12): qty 1

## 2023-11-13 MED ORDER — MIDODRINE HCL 5 MG PO TABS
10.0000 mg | ORAL_TABLET | Freq: Three times a day (TID) | ORAL | Status: DC
Start: 2023-11-14 — End: 2023-11-17
  Administered 2023-11-14 – 2023-11-17 (×11): 10 mg via ORAL
  Filled 2023-11-13 (×11): qty 2

## 2023-11-13 NOTE — Progress Notes (Signed)
 Progress Note  Patient Name: Martha [name] Date of Encounter: 11/13/2023  Primary Cardiologist: None  Subjective   Somnolent. Waxing and waning.  Inpatient Medications    Scheduled Meds:  atorvastatin   20 mg Oral Daily   bacitracin -polymyxin b    Left Eye BID   busPIRone   7.5 mg Oral BID   calcitRIOL   2 mcg Oral Q M,W,F   Chlorhexidine  Gluconate Cloth  6 each Topical Q0600   cinacalcet   30 mg Oral Q M,W,F   [START ON 11/15/2023] collagenase    Topical Daily   darbepoetin (ARANESP ) injection - DIALYSIS  100 mcg Subcutaneous Q Wed-1800   feeding supplement (NEPRO CARB STEADY)  237 mL Oral BID BM   liver oil-zinc  oxide   Topical BID   mupirocin  ointment   Nasal BID   nystatin    Topical TID   pantoprazole   80 mg Oral Daily   PARoxetine   20 mg Oral QHS   sodium hypochlorite   Irrigation BID   sucroferric oxyhydroxide  1,000 mg Oral TID WC   Continuous Infusions:  amiodarone  30 mg/hr (11/12/23 1549)   ceFEPime  (MAXIPIME ) IV Stopped (11/11/23 1852)   heparin  1,900 Units/hr (11/13/23 0921)   vancomycin  Stopped (11/11/23 1742)   PRN Meds: acetaminophen  **OR** acetaminophen , ALPRAZolam , guaiFENesin -dextromethorphan , HYDROmorphone  (DILAUDID ) injection, loperamide , naLOXone  (NARCAN )  injection, ondansetron  **OR** ondansetron  (ZOFRAN ) IV   Vital Signs    Vitals:   11/13/23 0930 11/13/23 1000 11/13/23 1025 11/13/23 1030  BP: (!) 120/55 116/68 (!) 122/53 (!) 120/34  Pulse:  90    Resp: (!) 23 16 18 20   Temp:  97.9 F (36.6 C)    TempSrc:  Axillary    SpO2: 97% 99%    Weight:  107 kg    Height:        Intake/Output Summary (Last 24 hours) at 11/13/2023 1045 Last data filed at 11/13/2023 0900 Gross per 24 hour  Intake 511.07 ml  Output 5 ml  Net 506.07 ml   Filed Weights   11/11/23 1301 11/11/23 1812 11/13/23 1000  Weight: 117 kg 114 kg 107 kg    Telemetry     Personally reviewed. Afib, HR 90-100s  ECG    Not performed today  Physical Exam   GEN: No acute  distress.   Neck: Unable to examine due to patient being somnolent Cardiac: RRR, no murmur, rub, or gallop.  Respiratory: Nonlabored. Clear to auscultation bilaterally. GI: Soft, nontender, bowel sounds present. MS: No edema; No deformity. Neuro:  Nonfocal. Psych: Somnolent  Labs    Chemistry Recent Labs  Lab 11/11/23 1320 11/12/23 0551 11/13/23 0413  NA 135 133* 133*  K 3.4* 3.3* 3.6  CL 92* 95* 95*  CO2 25 26 27   GLUCOSE 175* 177* 162*  BUN 43* 27* 35*  CREATININE 6.12* 3.73* 4.73*  CALCIUM  9.2 8.9 8.9  PROT  --  5.6* 5.6*  ALBUMIN  1.9* 2.2* 2.1*  AST  --  10* 11*  ALT  --  9 8  ALKPHOS  --  75 75  BILITOT  --  1.4* 0.8  GFRNONAA 8* 14* 10*  ANIONGAP 18* 12 11     Hematology Recent Labs  Lab 11/11/23 1320 11/12/23 0551 11/13/23 0413  WBC 22.6* 14.8* 14.2*  RBC 3.26* 3.18* 3.26*  HGB 9.1* 8.7* 8.9*  HCT 29.4* 29.0* 29.5*  MCV 90.2 91.2 90.5  MCH 27.9 27.4 27.3  MCHC 31.0 30.0 30.2  RDW 18.3* 17.9* 18.5*  PLT 110* 107* 111*  Cardiac Enzymes Recent Labs  Lab 10/20/23 2132 11/03/23 1714 11/03/23 1921 11/11/23 1441 11/11/23 1646  TROPONINIHS 12 12 14  330* 322*    BNPNo results for input(s): BNP, PROBNP in the last 168 hours.   DDimerNo results for input(s): DDIMER in the last 168 hours.   Radiology    No results found.   Assessment & Plan   Atrial fibrillation with RVR: New onset this admission in the setting of buttock cellulitis although given her history of splenic infarct recently, she might be having paroxysmal A-fib. Initially started on diltiazem  drip but became hypotensive, hence had to be started on amiodarone  drip.  On Amio drip, HR 90-100s, in A-fib on telemetry. Intermittent bouts of agitation and somnolence. If HR is more than 120 consistently for hours, okay to give only 1 dose of IV digoxin. Otherwise, will start metoprolol  tartrate 25 mg every 6 hours for more adequate rate control.  Hold metoprolol  for BP less than 100  mmHg SBP. Will switch amio drip to p.o. amiodarone  200 mg twice daily.  Although A-fib is new onset this admission, will defer DCCV due to documentation of noncompliance to dialysis.  Will see her in the clinic and schedule her for DCCV when she demonstrates medication compliance.  Outpatient OSA evaluation, if not tested before.   Myocardial injury secondary to demand ischemia from Afib and RVR, buttock cellulitis: Intermittent bouts of agitation and complaints.  No indication of ischemia evaluation at this time. Hs troponins 330>>322.  Buttock cellulitis: On antibiotics, management per primary team.  Acute metabolic encephalopathy: Likely from sepsis.  Management per primary team.  ESRD DD: Recent noncompliance.  Nephrology on board.  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  CRITICAL CARE Performed by: Diannah SQUIBB Meta Kroenke   Total critical care time: 30 minutes  Critical care time was exclusive of separately billable procedures and treating other patients.  Critical care was necessary to treat or prevent imminent or life-threatening deterioration.  Critical care was time spent personally by me on the following activities: development of treatment plan with patient and/or surrogate as well as nursing, discussions with consultants, evaluation of patient's response to treatment, examination of patient, obtaining history from patient or surrogate, ordering and performing treatments and interventions, ordering and review of laboratory studies, ordering and review of radiographic studies, pulse oximetry and re-evaluation of patient's condition.   Signed, Diannah SQUIBB [place], MD  11/13/2023, 10:45 AM

## 2023-11-13 NOTE — Progress Notes (Signed)
 PHARMACY - ANTICOAGULATION CONSULT NOTE  Pharmacy Consult for Heparin  Indication: atrial fibrillation  Allergies  Allergen Reactions   Codeine  Nausea And Vomiting   Tape Other (See Comments) and Rash    Pull skin off.  Paper tape is ok    Patient Measurements: Height: 5' 4 (162.6 cm) Weight: 114 kg (251 lb 5.2 oz) (bedweight) IBW/kg (Calculated) : 54.7 HEPARIN  DW (KG): 89.4  Vital Signs: Temp: 98.1 F (36.7 C) (04/18 0700) Temp Source: Axillary (04/18 0700) BP: 127/53 (04/18 0700) Pulse Rate: 95 (04/18 0500)  Labs: Recent Labs    11/11/23 1320 11/11/23 1320 11/11/23 1441 11/11/23 1646 11/12/23 0551 11/12/23 1439 11/12/23 2246 11/13/23 0413  HGB 9.1*  --   --   --  8.7*  --   --  8.9*  HCT 29.4*  --   --   --  29.0*  --   --  29.5*  PLT 110*  --   --   --  107*  --   --  111*  APTT  --    < >  --  39* 41* 49* 66* 58*  HEPARINUNFRC  --   --   --  >1.10* >1.10*  --   --  >1.10*  CREATININE 6.12*  --   --   --  3.73*  --   --  4.73*  TROPONINIHS  --   --  330* 322*  --   --   --   --    < > = values in this interval not displayed.    Estimated Creatinine Clearance: 16.6 mL/min (A) (by C-G formula based on SCr of 4.73 mg/dL (H)).   Medical History: Past Medical History:  Diagnosis Date   Acid reflux    Anemia    Arthritis    Bipolar 1 disorder (HCC)    Blindness of left eye    left eye removed   Carpal tunnel syndrome, bilateral    Cervical radiculopathy    CHF (congestive heart failure) (HCC)    Constipation    COPD (chronic obstructive pulmonary disease) (HCC) 2014   Degenerative disc disease, thoracic    Depression    Diabetes mellitus    Type II   Diabetic retinopathy (HCC)    End stage kidney disease (HCC)    M, W, F [facility]   Gout    Hypertension    Non-alcoholic cirrhosis (HCC)    Noncompliance with medication regimen    Obesity (BMI 30-39.9)    OSA (obstructive sleep apnea)    cpap   Panic attack    RLS (restless legs  syndrome)     Medications:  Medications Prior to Admission  Medication Sig Dispense Refill Last Dose/Taking   ADVAIR DISKUS 250-50 MCG/ACT AEPB Inhale 1 puff into the lungs 2 (two) times daily.   Past Week   albuterol  (PROVENTIL ) (2.5 MG/3ML) 0.083% nebulizer solution Take 3 mLs (2.5 mg total) by nebulization every 12 (twelve) hours as needed for wheezing or shortness of breath. 75 mL 0 Past Week   ALPRAZolam  (XANAX ) 1 MG tablet Take 1 tablet (1 mg total) by mouth 4 (four) times daily as needed for anxiety. (Patient taking differently: Take 1 mg by mouth 4 (four) times daily.) 9 tablet 0 11/01/2023   apixaban  (ELIQUIS ) 5 MG TABS tablet Take 2 tablets (10 mg total) by mouth 2 (two) times daily for 7 days, THEN 1 tablet (5 mg total) 2 (two) times daily for 23 days. 74 tablet 0  10/30/2023   Artificial Tear Ointment (DRY EYES OP) Place 1 drop into the right eye 2 (two) times daily.   11/01/2023   atorvastatin  (LIPITOR) 20 MG tablet Take 20 mg by mouth daily.   11/01/2023   atropine  (ISOPTO ATROPINE ) 1 % ophthalmic solution Place 1 drop into the left eye 3 (three) times daily.   11/01/2023   brimonidine  (ALPHAGAN ) 0.2 % ophthalmic solution Place 1 drop into the left eye 3 (three) times daily.   11/01/2023   busPIRone  (BUSPAR ) 7.5 MG tablet Take 1 tablet (7.5 mg total) by mouth 2 (two) times daily. 7 tablet 0 11/01/2023   cinacalcet  (SENSIPAR ) 60 MG tablet Take 60 mg by mouth daily.   11/01/2023   colchicine  0.6 MG tablet TAKE 1 TABLET(0.6 MG) BY MOUTH DAILY 30 tablet 0 11/01/2023   doxercalciferol  (HECTOROL ) 4 MCG/2ML injection Inject 0.5 mLs (1 mcg total) into the vein every Monday, Wednesday, and Friday with hemodialysis. 2 mL  10/30/2023   gabapentin  (NEURONTIN ) 100 MG capsule Take 1 capsule (100 mg total) by mouth at bedtime. (Patient taking differently: Take 300 mg by mouth at bedtime.) 7 capsule 0 11/01/2023   HYDROcodone -acetaminophen  (NORCO) 10-325 MG tablet Take 1 tablet by mouth 4 (four) times daily.   11/01/2023    insulin  glargine (LANTUS  SOLOSTAR) 100 UNIT/ML Solostar Pen Inject 30 Units into the skin daily.   11/01/2023   latanoprost  (XALATAN ) 0.005 % ophthalmic solution Place 1 drop into the left eye 2 (two) times daily.   11/01/2023   omeprazole  (PRILOSEC) 40 MG capsule Take 40 mg by mouth daily.   11/01/2023   ondansetron  (ZOFRAN ) 4 MG tablet TAKE (1) TABLET THREE TIMES DAILY AS NEEDED FOR NAUSEA.   11/01/2023   PARoxetine  (PAXIL ) 20 MG tablet Take 1 tablet (20 mg total) by mouth every evening. This is to prevent panic attacks (Patient taking differently: Take 20 mg by mouth at bedtime.) 7 tablet 0 11/01/2023   VELPHORO  500 MG chewable tablet Chew 1,000 mg by mouth 3 (three) times daily.   11/01/2023   Blood Glucose Monitoring Suppl (FORA V30A BLOOD GLUCOSE SYSTEM) DEVI       FORACARE PREMIUM V10 TEST test strip        Assessment: Patient presented with buttock cellulitis. ON chronic anticoagulation with eliquis  for afib. Patient having rhythm changes and MD concerned. Diltiazem  infusion started, cardiology to evaluate. MD asked pharmacy to transition to heparin  for now. Will follow APTT and HL until correlates. Last dose of eliquis  4/16 at 0830  Goal of Therapy:  Heparin  level 0.3-0.7 units/ml aPTT 66-102 seconds Monitor platelets by anticoagulation protocol: Yes   Plan: Give 1300 units bolus x1; then increase rate of heparin  infusion to 1900 units/hour. Check aPTT in 8 hours, then daily once at least two levels are consecutively therapeutic. Daily heparin  level Continue to monitor CBC daily while on heparin  infusion.   Will M. Lenon, PharmD Clinical Pharmacist 11/13/2023 8:49 AM

## 2023-11-13 NOTE — Plan of Care (Signed)
  Problem: Clinical Measurements: Goal: Will remain free from infection Outcome: Progressing Goal: Diagnostic test results will improve Outcome: Progressing Goal: Respiratory complications will improve Outcome: Progressing Goal: Cardiovascular complication will be avoided Outcome: Progressing   Problem: Elimination: Goal: Will not experience complications related to bowel motility Outcome: Progressing Goal: Will not experience complications related to urinary retention Outcome: Progressing   

## 2023-11-13 NOTE — Progress Notes (Signed)
   HEMODIALYSIS TREATMENT NOTE:  Restless for entire HD session and yelling "Help me. Oh God, help me please!"  Safety mitts on - pt requested I remove them, after which she promptly pulled off all telemetry wires.  Mitts reapplied.  Oriented to self and situation (dialysis).  UF is limited by hypotension.  Albumin  25g given once.  Hypotension persists.  Increasingly restless, screaming, attempting to get OOB.  High risk for infiltration of AVF.  Above d/w Dr. Christianne Cowper.  HD was stopped after 3 hours due to unsafe behaviors: restlessness, propping upper body up on elbows, attempting bed exit, pressing mitted hand down over AVF while yelling "I can't do this.  Please.  I gotta get [place] here!"  Total run time: 3 hours Net UF: 300 ml  Post-HD:  11/13/23 1400  Vital Signs  Temp 97.7 F (36.5 C)  Temp Source Axillary  Pulse Rate 84  Pulse Rate Source Monitor  Resp 20  BP (!) 94/34  BP Location Right Leg  BP Method Automatic  Patient Position (if appropriate) Lying  Oxygen  Therapy  SpO2 97 %  O2 Device Nasal Cannula  O2 Flow Rate (L/min) 3 L/min  Dialysis Weight  Weight 112 kg (Sizewise bedscale (? pre-weight))  Type of Weight Post-Dialysis  Post Treatment  Dialyzer Clearance Lightly streaked  Hemodialysis Intake (mL) 300 mL  Liters Processed 54.1  Fluid Removed (mL) 600 mL  Tolerated HD Treatment No (Comment)  Post-Hemodialysis Comments See progress note  AVG/AVF Arterial Site Held (minutes) 10 minutes  AVG/AVF Venous Site Held (minutes) 10 minutes  Fistula / Graft Left Upper arm Arteriovenous fistula  Placement Date/Time: 04/26/19 1056   Orientation: Left  Access Location: Upper arm  Access Type: (c) Arteriovenous fistula  Fistula / Graft Assessment Thrill;Bruit;Other (Comment) (ecchymosis - resolving)  Status Patent    Waunita Haff, RN AP [phone]

## 2023-11-13 NOTE — Progress Notes (Signed)
 Attempted NG tube insertion. Procedure was explained to the patient. Pt was combative and would not swallow the water  for us  while passing the tube. Small amount of blood observed coming from the right nare. Pt was coughing, thrashing around and screaming throughout the procedure. The tube was removed after only getting it about 25 cm down. Patient is currently still screaming out "help me", which has been going on most of the day.

## 2023-11-13 NOTE — Progress Notes (Signed)
 [facility] Progress Note   Assessment/ Plan:   Outpatient dialysis prescription:  [facility] MWF EDW 120.5kg  4hrs AVF BFR 400/DFR 500 Heparin  1000 bolus and 800/hr Venofer 50 weekly Mircera 200 q2wks Sensipar  30 qtx, calcitriol  2mcg qtx     Assessment/Plan:    # ESRD: - HD per MWF schedule.  Next HD 4/18.  Will ask that she be done separately in her ICU room   # Volume/ hypertension: Hypertensive and volume overloaded initially--improved. Significant body edema on initial CT. UF as tolerated, may need edw adjustment on d/c   # Anemia of Chronic Kidney Disease: - No IV iron  for now given infection.  - Aranesp  60 mcg every Wednesday---increasing to 100mcg    # Secondary Hyperparathyroidism/Hyperphosphatemia: Ca and phos acceptable. On sensipar  and calcitriol  Note history of calciphylaxis.  Pics have a lot of slough on them and hard to tell if this fits with calciphylaxis- would have a high suspicion to resume Na thio.   #Buttock cellulitis: On abx per primary team. Vanc+cefepime  w/ HD   # Uncontrolled type 2 diabetes with hyperglycemia: mgmt per primary team    #GOC:  note patient missing dialysis routinely as an outpatient- often because she is in pain and generally doesn't feel well. Palliative care has seen: full scope of care   Disposition per primary team   Subjective:    Seen in room.  For HD today.  No RVR.  More arousable today but isn't really able to carry on a conversation.  Wound pics reviewed in Epic.     Objective:   BP (!) 120/55   Pulse 95   Temp 98.1 F (36.7 C) (Axillary)   Resp (!) 23   Ht 5' 4 (1.626 m)   Wt 114 kg Comment: bedweight  LMP 08/03/2008 (Exact Date)   SpO2 97%   BMI 43.14 kg/m   Physical Exam: Hzw:dozzepwh CVS: RRR now, on amio gtt Resp: clear Abd: soft Ext: 1+ anasarca  Labs: BMET Recent Labs  Lab 11/09/23 1835 11/11/23 1320 11/12/23 0551 11/13/23 0413  NA 132* 135 133* 133*  K 3.8 3.4* 3.3*  3.6  CL 93* 92* 95* 95*  CO2 25 25 26 27   GLUCOSE 154* 175* 177* 162*  BUN 54* 43* 27* 35*  CREATININE 7.96* 6.12* 3.73* 4.73*  CALCIUM  9.4 9.2 8.9 8.9  PHOS 4.2 2.9 2.9  --    CBC Recent Labs  Lab 11/09/23 1835 11/11/23 1320 11/12/23 0551 11/13/23 0413  WBC 18.3* 22.6* 14.8* 14.2*  NEUTROABS  --   --  13.0*  --   HGB 8.9* 9.1* 8.7* 8.9*  HCT 29.4* 29.4* 29.0* 29.5*  MCV 90.7 90.2 91.2 90.5  PLT 112* 110* 107* 111*      Medications:     atorvastatin   20 mg Oral Daily   bacitracin -polymyxin b    Left Eye BID   busPIRone   7.5 mg Oral BID   calcitRIOL   2 mcg Oral Q M,W,F   Chlorhexidine  Gluconate Cloth  6 each Topical Q0600   cinacalcet   30 mg Oral Q M,W,F   [START ON 11/15/2023] collagenase    Topical Daily   darbepoetin (ARANESP ) injection - DIALYSIS  100 mcg Subcutaneous Q Wed-1800   feeding supplement (NEPRO CARB STEADY)  237 mL Oral BID BM   liver oil-zinc  oxide   Topical BID   mupirocin  ointment   Nasal BID   nystatin    Topical TID   pantoprazole   80 mg Oral Daily  PARoxetine   20 mg Oral QHS   sodium hypochlorite   Irrigation BID   sucroferric oxyhydroxide  1,000 mg Oral TID WC     Almarie Bonine MD 11/13/2023, 10:04 AM

## 2023-11-13 NOTE — Progress Notes (Signed)
 PHARMACY - ANTICOAGULATION CONSULT NOTE  Pharmacy Consult for Heparin  Indication: atrial fibrillation  Allergies  Allergen Reactions   Codeine  Nausea And Vomiting   Tape Other (See Comments) and Rash    Pull skin off.  Paper tape is ok    Patient Measurements: Height: 5' 4 (162.6 cm) Weight: 112 kg (246 lb 14.6 oz) (Sizewise bedscale (? pre-weight)) IBW/kg (Calculated) : 54.7 HEPARIN  DW (KG): 89.4  Vital Signs: Temp: 97 F (36.1 C) (04/18 2034) Temp Source: Oral (04/18 2034) BP: 118/62 (04/18 2101) Pulse Rate: 94 (04/18 2101)  Labs: Recent Labs    11/11/23 1320 11/11/23 1320 11/11/23 1441 11/11/23 1646 11/12/23 0551 11/12/23 1439 11/12/23 2246 11/13/23 0413 11/13/23 1737  HGB 9.1*  --   --   --  8.7*  --   --  8.9*  --   HCT 29.4*  --   --   --  29.0*  --   --  29.5*  --   PLT 110*  --   --   --  107*  --   --  111*  --   APTT  --    < >  --  39* 41*   < > 66* 58* 47*  HEPARINUNFRC  --   --   --  >1.10* >1.10*  --   --  >1.10*  --   CREATININE 6.12*  --   --   --  3.73*  --   --  4.73*  --   TROPONINIHS  --   --  330* 322*  --   --   --   --   --    < > = values in this interval not displayed.    Estimated Creatinine Clearance: 16.5 mL/min (A) (by C-G formula based on SCr of 4.73 mg/dL (H)).   Medical History: Past Medical History:  Diagnosis Date   Acid reflux    Anemia    Arthritis    Bipolar 1 disorder (HCC)    Blindness of left eye    left eye removed   Carpal tunnel syndrome, bilateral    Cervical radiculopathy    CHF (congestive heart failure) (HCC)    Constipation    COPD (chronic obstructive pulmonary disease) (HCC) 2014   Degenerative disc disease, thoracic    Depression    Diabetes mellitus    Type II   Diabetic retinopathy (HCC)    End stage kidney disease (HCC)    M, W, F [facility]   Gout    Hypertension    Non-alcoholic cirrhosis (HCC)    Noncompliance with medication regimen    Obesity (BMI 30-39.9)    OSA (obstructive  sleep apnea)    cpap   Panic attack    RLS (restless legs syndrome)     Medications:  Medications Prior to Admission  Medication Sig Dispense Refill Last Dose/Taking   ADVAIR DISKUS 250-50 MCG/ACT AEPB Inhale 1 puff into the lungs 2 (two) times daily.   Past Week   albuterol  (PROVENTIL ) (2.5 MG/3ML) 0.083% nebulizer solution Take 3 mLs (2.5 mg total) by nebulization every 12 (twelve) hours as needed for wheezing or shortness of breath. 75 mL 0 Past Week   ALPRAZolam  (XANAX ) 1 MG tablet Take 1 tablet (1 mg total) by mouth 4 (four) times daily as needed for anxiety. (Patient taking differently: Take 1 mg by mouth 4 (four) times daily.) 9 tablet 0 11/01/2023   apixaban  (ELIQUIS ) 5 MG TABS tablet Take 2 tablets (10  mg total) by mouth 2 (two) times daily for 7 days, THEN 1 tablet (5 mg total) 2 (two) times daily for 23 days. 74 tablet 0 10/30/2023   Artificial Tear Ointment (DRY EYES OP) Place 1 drop into the right eye 2 (two) times daily.   11/01/2023   atorvastatin  (LIPITOR) 20 MG tablet Take 20 mg by mouth daily.   11/01/2023   atropine  (ISOPTO ATROPINE ) 1 % ophthalmic solution Place 1 drop into the left eye 3 (three) times daily.   11/01/2023   brimonidine  (ALPHAGAN ) 0.2 % ophthalmic solution Place 1 drop into the left eye 3 (three) times daily.   11/01/2023   busPIRone  (BUSPAR ) 7.5 MG tablet Take 1 tablet (7.5 mg total) by mouth 2 (two) times daily. 7 tablet 0 11/01/2023   cinacalcet  (SENSIPAR ) 60 MG tablet Take 60 mg by mouth daily.   11/01/2023   colchicine  0.6 MG tablet TAKE 1 TABLET(0.6 MG) BY MOUTH DAILY 30 tablet 0 11/01/2023   doxercalciferol  (HECTOROL ) 4 MCG/2ML injection Inject 0.5 mLs (1 mcg total) into the vein every Monday, Wednesday, and Friday with hemodialysis. 2 mL  10/30/2023   gabapentin  (NEURONTIN ) 100 MG capsule Take 1 capsule (100 mg total) by mouth at bedtime. (Patient taking differently: Take 300 mg by mouth at bedtime.) 7 capsule 0 11/01/2023   HYDROcodone -acetaminophen  (NORCO) 10-325 MG  tablet Take 1 tablet by mouth 4 (four) times daily.   11/01/2023   insulin  glargine (LANTUS  SOLOSTAR) 100 UNIT/ML Solostar Pen Inject 30 Units into the skin daily.   11/01/2023   latanoprost  (XALATAN ) 0.005 % ophthalmic solution Place 1 drop into the left eye 2 (two) times daily.   11/01/2023   omeprazole  (PRILOSEC) 40 MG capsule Take 40 mg by mouth daily.   11/01/2023   ondansetron  (ZOFRAN ) 4 MG tablet TAKE (1) TABLET THREE TIMES DAILY AS NEEDED FOR NAUSEA.   11/01/2023   PARoxetine  (PAXIL ) 20 MG tablet Take 1 tablet (20 mg total) by mouth every evening. This is to prevent panic attacks (Patient taking differently: Take 20 mg by mouth at bedtime.) 7 tablet 0 11/01/2023   VELPHORO  500 MG chewable tablet Chew 1,000 mg by mouth 3 (three) times daily.   11/01/2023   Blood Glucose Monitoring Suppl (FORA V30A BLOOD GLUCOSE SYSTEM) DEVI       FORACARE PREMIUM V10 TEST test strip        Assessment: Patient presented with buttock cellulitis. ON chronic anticoagulation with eliquis  for afib. Patient having rhythm changes and MD concerned. Diltiazem  infusion started, cardiology to evaluate. MD asked pharmacy to transition to heparin  for now. Will follow APTT and HL until correlates. Last dose of eliquis  4/16 at 0830  Goal of Therapy:  Heparin  level 0.3-0.7 units/ml aPTT 66-102 seconds Monitor platelets by anticoagulation protocol: Yes   Plan: 4/18@1737 : aPTT 47, subtherapeutic Give 2600 units bolus x1; then increase rate of heparin  infusion to 2150 units/hour. Check aPTT and heparin  level tomorrow with morning labs Continue to monitor CBC daily while on heparin  infusion.   Demisha Nokes A Freddy Spadafora, PharmD Clinical Pharmacist 11/13/2023 9:17 PM

## 2023-11-13 NOTE — Progress Notes (Signed)
 PROGRESS NOTE    Martha [name]  ZOX:[id number] DOB: [birth year] DOA: 11/03/2023 PCP: Forest Idol, NP   Brief Narrative:    The patient is a 58 y.o. female with medical history significant of  ESRD on HD (MWF) COPD, hypertension, NASH, AOCD, left eye blindness, type 2 diabetes mellitus with retinopathy, RLS, CHF who presents to the emergency department due to lower abdominal pain, nausea, NBNB vomiting.  She is found to have buttock cellulitis and was initiated on antibiotics and her fever has resolved and leukocytosis is improving.  CT scan of pelvis demonstrated no drainable abscess.  She was undergoing hemodialysis on 4/16 and went into atrial fibrillation with RVR and was transferred to ICU on heparin  drip with possible need for amiodarone  per cardiology.  Assessment & Plan:   Principal Problem:   Acute febrile illness Active Problems:   GERD   Morbid obesity (HCC)   RESTLESS LEG SYNDROME   OSA on CPAP   Abdominal pain   Nausea & vomiting   Lactic acidosis   Thrombocytopenia (HCC)   Cellulitis of right buttock  Assessment and Plan:   Buttock Cellulitis:  Initially presented with fever, started empirically on vancomycin  and Zosyn . Fever had resolved so antibiotics were discontinued but  Started having drainage from right buttock, general surgery was consulted for possible abscess.  CT of the pelvis showed no drainable abscess but showed cellulitis. Restarted vancomycin  and cefepime . May need to re-image given worsening drainage and WBC. WBC Trend: -Wound care RN consulted.  Lactic acid level was not elevated again but procalcitonin was 6.07.   - Repeat general surgery evaluation due to worsening wound, discussed with Dr. Larrie Po who evaluated wound on 4/17 -Currently on antibiotics as below day 11/14   New Onset A Fib w/RVR / Elevated Troponin: Checked EKG and showed atrial fibrillation this tracing as last tracing on 8 Place showed normal sinus rhythm.  Changed her Eliquis  to  heparin  drip.  Troponin was elevated at 330.  CHA2DS2-VASc score is at least 3-4.  Recent echocardiogram done a few weeks ago and showed normal LVEF.  Initiated on Cardizem  drip but then became extremely hypotensive so this was discontinued and she had to begin fluids and albumin .   - Appreciate cardiology recommendations with IV amiodarone  to oral amiodarone  today and uses metoprolol  every 6 hours for heart rate control with plans to hold for blood pressure drops -Continue heparin  drip for now until closer to discharge   Acute encephalopathy/hypersomnolence:  Likely in setting of opioid overdose. ABG showed pCO2 42 yesterday; CT head showed No evidence of acute intracranial abnormality. Resolved after getting IV Narcan . Will discontinue oxycodone  at this time. Will continue with only Tylenol  as needed for now -Careful use of IV Dilaudid  for pain management   Pubic symphysis inflammation:  CT pelvis shows erosive changes at the pubic symphysis likely from inflammatory process versus osteitis versus infection. 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 Nephrology was consulted for maintenance dialysis in the morning per EDP. Patient has been missing hemodialysis due to pain, nephrology recommending palliative care consult for goals of care. Palliative care consulted, continue dialysis with next session 4/18   Hypertension -> Hypotension: Blood pressure had  been elevated but then dropped in the setting of her A-fib with RVR and diltiazem  drip.  Hold antihypertensive and was given albumin  25 g and IV fluid bolus.  To monitor blood pressures per protocol.  If not improving may need pressors.  Continue  to monitor blood pressures per protocol.  Last blood pressure reading was 113/58 but did drop as low as 74/22   History of Calciphylaxis: Continue sensipar  as well as Hectorol     Thrombocytopenia:  -Stable, continue to monitor CBC -Likely due to splenic sequestration and infection.  Continue with broad-spectrum antibiotics as above   Splenic Infarct: Hold Eliquis  and change to Heparin  gtt given A Fib w/ RVR and elevated Troponin   OSA: Continue CPAP.     Restless Leg Syndrome: No longer on Gabapentin    GERD/GI Prophylaxis: C/w PPI with Pantoprazole  80 mg po Daily   Depression, Anxiety, Bipolar Disorder: C/w Buspirone  7.5 mg po BID and Paroxetine  20 mg po at bedtime   Normocytic Anemia/Anemia of Chronic Kidney Disease: Hemoglobin/hematocrit is now 9.1/29.4.  Continue with darbepoetin alpha 100 mcg subcu every Wednesday and continue monitor for signs or symptoms of bleeding given that she is now being anticoag with heparin  drip.  Check anemia panel in AM.  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  Started on triple antibiotic ophthalmic ointment twice daily   Hypoalbuminemia: Patient's Albumin  trending down and now 2.1 on last check. CTM and Trend and repeat CMP in the AM   Class III (Morbid) Obesity: Complicates overall prognosis and care -Estimated body mass index is 40.16 kg/m as calculated from the following:   Height as of this encounter: 5\' 8"  (1.727 m).   Weight as of this encounter: 119.8 kg. Weight Loss and Dietary Counseling given  New concern for UTI -Continue current antibiotics and send off urine culture pending    DVT prophylaxis:Heparin  drip Code Status: Full Family Communication: None at bedside Disposition Plan:  Status is: Inpatient Remains inpatient appropriate because:  Need for IV medications.   Consultants:  Cardiology Nephrology General Surgery Palliative care medicine  Procedures:  As above  Antimicrobials:  Anti-infectives (From admission, onward)    Start     Dose/Rate Route Frequency Ordered Stop   11/09/23 1600  ceFEPIme  (MAXIPIME ) 2 g in sodium chloride  0.9 % 100 mL IVPB        2 g 200 mL/hr over 30 Minutes Intravenous Every M-W-F (Hemodialysis) 11/08/23 0854     11/09/23 1600  vancomycin  (VANCOCIN ) IVPB 1000 mg/200 mL premix        1,000 mg 200 mL/hr over 60 Minutes Intravenous Every M-W-F (Hemodialysis) 11/08/23 0855     11/08/23 0930  vancomycin  (VANCOCIN ) IVPB 1000 mg/200 mL premix        1,000 mg 200 mL/hr over 60 Minutes Intravenous  Once 11/08/23 0824 11/08/23 1201   11/08/23 0900  ceFEPIme  (MAXIPIME ) 2 g in sodium chloride  0.9 % 100 mL IVPB        2 g 200 mL/hr over 30 Minutes Intravenous  Once 11/08/23 0804 11/08/23 1008   11/05/23 1130  piperacillin -tazobactam (ZOSYN ) 2.25 g in sodium chloride  0.9 % 50 mL IVPB  Status:  Discontinued        2.25 g 100 mL/hr over 30 Minutes Intravenous Every 8 hours 11/05/23 1054 11/05/23 1127  11/04/23 1200  vancomycin  (VANCOCIN ) IVPB 1000 mg/200 mL premix  Status:  Discontinued        1,000 mg 200 mL/hr over 60 Minutes Intravenous Every M-W-F (Hemodialysis) 11/04/23 0250 11/05/23 1127   11/04/23 0600  piperacillin -tazobactam (ZOSYN ) IVPB 2.25 g  Status:  Discontinued        2.25 g 100 mL/hr over 30 Minutes Intravenous Every 8 hours 11/04/23 0250 11/05/23 1054   11/03/23 2315  vancomycin  (VANCOREADY) IVPB 2000 mg/400 mL        2,000 mg 200 mL/hr over 120 Minutes Intravenous  Once 11/03/23 2300 11/04/23 0229   11/03/23 2315  piperacillin -tazobactam (ZOSYN ) IVPB 3.375 g        3.375 g 100 mL/hr over 30 Minutes Intravenous  Once 11/03/23 2300 11/04/23 0024      Subjective: Patient seen and evaluated today with no new acute complaints or concerns. No acute concerns or events noted  overnight.  She appears more arousable today.  Objective: Vitals:   11/13/23 1115 11/13/23 1130 11/13/23 1134 11/13/23 1145  BP: (!) 118/48 98/75  (!) 100/59  Pulse:    65  Resp: 17 19  (!) 21  Temp:   (!) 97.4 F (36.3 C)   TempSrc:   Axillary   SpO2:  98%  97%  Weight:      Height:        Intake/Output Summary (Last 24 hours) at 11/13/2023 1152 Last data filed at 11/13/2023 1101 Gross per 24 hour  Intake 1175.79 ml  Output 5 ml  Net 1170.79 ml   Filed Weights   11/11/23 1301 11/11/23 1812 11/13/23 1000  Weight: 117 kg 114 kg 107 kg    Examination:  General exam: Appears calm and comfortable, somnolent but arousable Respiratory system: Clear to auscultation. Respiratory effort normal.  4 L nasal cannula Cardiovascular system: S1 & S2 heard, irregular. Gastrointestinal system: Abdomen is soft Central nervous system: Alert and awake Extremities: No edema Skin: Wound as noted in chart Psychiatry: Flat affect.    Data Reviewed: I have personally reviewed following labs and imaging studies  CBC: Recent Labs  Lab 11/09/23 1835 11/11/23 1320 11/12/23 0551 11/13/23 0413  WBC 18.3* 22.6* 14.8* 14.2*  NEUTROABS  --   --  13.0*  --   HGB 8.9* 9.1* 8.7* 8.9*  HCT 29.4* 29.4* 29.0* 29.5*  MCV 90.7 90.2 91.2 90.5  PLT 112* 110* 107* 111*   Basic Metabolic Panel: Recent Labs  Lab 11/09/23 1835 11/11/23 1320 11/12/23 0551 11/13/23 0413  NA 132* 135 133* 133*  K 3.8 3.4* 3.3* 3.6  CL 93* 92* 95* 95*  CO2 25 25 26 27   GLUCOSE 154* 175* 177* 162*  BUN 54* 43* 27* 35*  CREATININE 7.96* 6.12* 3.73* 4.73*  CALCIUM  9.4 9.2 8.9 8.9  MG  --   --  1.9 1.9  PHOS 4.2 2.9 2.9  --    GFR: Estimated Creatinine Clearance: 16 mL/min (A) (by C-G formula based on SCr of 4.73 mg/dL (H)). Liver Function Tests: Recent Labs  Lab 11/09/23 1835 11/11/23 1320 11/12/23 0551 11/13/23 0413  AST  --   --  10* 11*  ALT  --   --  9 8  ALKPHOS  --   --  75 75  BILITOT  --   --   1.4* 0.8  PROT  --   --  5.6* 5.6*  ALBUMIN  2.1* 1.9* 2.2* 2.1*   No results for input(s): "LIPASE", "AMYLASE" in the last  168 hours. No results for input(s): "AMMONIA" in the last 168 hours. Coagulation Profile: No results for input(s): "INR", "PROTIME" in the last 168 hours. Cardiac Enzymes: No results for input(s): "CKTOTAL", "CKMB", "CKMBINDEX", "TROPONINI" in the last 168 hours. BNP (last 3 results) No results for input(s): "PROBNP" in the last 8760 hours. HbA1C: No results for input(s): "HGBA1C" in the last 72 hours. CBG: Recent Labs  Lab 11/12/23 1709  GLUCAP 159*   Lipid Profile: No results for input(s): "CHOL", "HDL", "LDLCALC", "TRIG", "CHOLHDL", "LDLDIRECT" in the last 72 hours. Thyroid  Function Tests: Recent Labs    11/11/23 1441 11/12/23 0722  TSH 4.577*  --   FREET4  --  1.41*   Anemia Panel: No results for input(s): "VITAMINB12", "FOLATE", "FERRITIN", "TIBC", "IRON ", "RETICCTPCT" in the last 72 hours. Sepsis Labs: Recent Labs  Lab 11/11/23 1646 11/11/23 1902 11/12/23 0551  PROCALCITON 6.07  --  6.57  LATICACIDVEN 1.0 1.1  --     Recent Results (from the past 240 hours)  Resp panel by RT-PCR (RSV, Flu A&B, Covid) Anterior Nasal Swab     Status: None   Collection Time: 11/03/23  4:54 PM   Specimen: Anterior Nasal Swab  Result Value Ref Range Status   SARS Coronavirus 2 by RT PCR NEGATIVE NEGATIVE Final    Comment: (NOTE) SARS-CoV-2 target nucleic acids are NOT DETECTED.  The SARS-CoV-2 RNA is generally detectable in upper respiratory specimens during the acute phase of infection. The lowest concentration of SARS-CoV-2 viral copies this assay can detect is 138 copies/mL. A negative result does not preclude SARS-Cov-2 infection and should not be used as the sole basis for treatment or other patient management decisions. A negative result may occur with  improper specimen collection/handling, submission of specimen other than nasopharyngeal swab,  presence of viral mutation(s) within the areas targeted by this assay, and inadequate number of viral copies(<138 copies/mL). A negative result must be combined with clinical observations, patient history, and epidemiological information. The expected result is Negative.  Fact Sheet for Patients:  BloggerCourse.com  Fact Sheet for Healthcare Providers:  SeriousBroker.it  This test is no t yet approved or cleared by the [place]  FDA and  has been authorized for detection and/or diagnosis of SARS-CoV-2 by FDA under an Emergency Use Authorization (EUA). This EUA will remain  in effect (meaning this test can be used) for the duration of the COVID-19 declaration under Section 564(b)(1) of the Act, 21 U.S.C.section 360bbb-3(b)(1), unless the authorization is terminated  or revoked sooner.       Influenza A by PCR NEGATIVE NEGATIVE Final   Influenza B by PCR NEGATIVE NEGATIVE Final    Comment: (NOTE) The Xpert Xpress SARS-CoV-2/FLU/RSV plus assay is intended as an aid in the diagnosis of influenza from Nasopharyngeal swab specimens and should not be used as a sole basis for treatment. Nasal washings and aspirates are unacceptable for Xpert Xpress SARS-CoV-2/FLU/RSV testing.  Fact Sheet for Patients: BloggerCourse.com  Fact Sheet for Healthcare Providers: SeriousBroker.it  This test is not yet approved or cleared by the [place]  FDA and has been authorized for detection and/or diagnosis of SARS-CoV-2 by FDA under an Emergency Use Authorization (EUA). This EUA will remain in effect (meaning this test can be used) for the duration of the COVID-19 declaration under Section 564(b)(1) of the Act, 21 U.S.C. section 360bbb-3(b)(1), unless the authorization is terminated or revoked.     Resp Syncytial Virus by PCR NEGATIVE NEGATIVE Final    Comment: (NOTE) Fact Sheet  for  Patients: BloggerCourse.com  Fact Sheet for Healthcare Providers: SeriousBroker.it  This test is not yet approved or cleared by the [place]  FDA and has been authorized for detection and/or diagnosis of SARS-CoV-2 by FDA under an Emergency Use Authorization (EUA). This EUA will remain in effect (meaning this test can be used) for the duration of the COVID-19 declaration under Section 564(b)(1) of the Act, 21 U.S.C. section 360bbb-3(b)(1), unless the authorization is terminated or revoked.  Performed at [facility], [address] [place], [place] [address]   Blood Culture (routine x 2)     Status: None   Collection Time: 11/03/23  5:14 PM   Specimen: BLOOD  Result Value Ref Range Status   Specimen Description BLOOD RIGHT ANTECUBITAL  Final   Special Requests   Final    BOTTLES DRAWN AEROBIC AND ANAEROBIC Blood Culture results may not be optimal due to an inadequate volume of blood received in culture bottles   Culture   Final    NO GROWTH 5 DAYS Performed at [facility], [address], [place], [place] [address]    Report Status 11/08/2023 FINAL  Final  Respiratory (~20 pathogens) panel by PCR     Status: None   Collection Time: 11/03/23  7:19 PM   Specimen: Nasopharyngeal Swab; Respiratory  Result Value Ref Range Status   Adenovirus NOT DETECTED NOT DETECTED Final   Coronavirus 229E NOT DETECTED NOT DETECTED Final    Comment: (NOTE) The Coronavirus on the Respiratory Panel, DOES NOT test for the novel  Coronavirus (2019 nCoV)    Coronavirus HKU1 NOT DETECTED NOT DETECTED Final   Coronavirus NL63 NOT DETECTED NOT DETECTED Final   Coronavirus OC43 NOT DETECTED NOT DETECTED Final   Metapneumovirus NOT DETECTED NOT DETECTED Final   Rhinovirus / Enterovirus NOT DETECTED NOT DETECTED Final   Influenza A NOT DETECTED NOT DETECTED Final   Influenza B NOT DETECTED NOT DETECTED Final   Parainfluenza Virus 1 NOT DETECTED NOT  DETECTED Final   Parainfluenza Virus 2 NOT DETECTED NOT DETECTED Final   Parainfluenza Virus 3 NOT DETECTED NOT DETECTED Final   Parainfluenza Virus 4 NOT DETECTED NOT DETECTED Final   Respiratory Syncytial Virus NOT DETECTED NOT DETECTED Final   Bordetella pertussis NOT DETECTED NOT DETECTED Final   Bordetella Parapertussis NOT DETECTED NOT DETECTED Final   Chlamydophila pneumoniae NOT DETECTED NOT DETECTED Final   Mycoplasma pneumoniae NOT DETECTED NOT DETECTED Final    Comment: Performed at [facility], [address], [place], [place] [address]  MRSA Next Gen by PCR, Nasal     Status: Abnormal   Collection Time: 11/05/23 12:00 PM   Specimen: Nasal Mucosa; Nasal Swab  Result Value Ref Range Status   MRSA by PCR Next Gen DETECTED (A) NOT DETECTED Final    Comment: RESULT CALLED TO, READ BACK BY AND VERIFIED WITH: D HAIRSTON AT 1356 11/05/23 BY A WILSON (NOTE) The GeneXpert MRSA Assay (FDA approved for NASAL specimens only), is one component of a comprehensive MRSA colonization surveillance program. It is not intended to diagnose MRSA infection nor to guide or monitor treatment for MRSA infections. Test performance is not FDA approved in patients less than 52 years old. Performed at [facility], [address], [place], [place] [address]   MRSA Next Gen by PCR, Nasal     Status: Abnormal   Collection Time: 11/11/23  2:06 PM   Specimen: Nasal Mucosa; Nasal Swab  Result Value Ref Range Status  MRSA by PCR Next Gen DETECTED (A) NOT DETECTED Final    Comment: RESULT CALLED TO, READ BACK BY AND VERIFIED WITH: JENNIFER KING @ 1731 ON 11/11/23 C VARNER (NOTE) The GeneXpert MRSA Assay (FDA approved for NASAL specimens only), is one component of a comprehensive MRSA colonization surveillance program. It is not intended to diagnose MRSA infection nor to guide or monitor treatment for MRSA infections. Test performance is not FDA approved in patients less than 109  years old. Performed at [facility], [address] [place], [place] [address]          Radiology Studies: No results found.       Scheduled Meds:  amiodarone   200 mg Oral BID   atorvastatin   20 mg Oral Daily   bacitracin -polymyxin b    Left Eye BID   busPIRone   7.5 mg Oral BID   calcitRIOL   2 mcg Oral Q M,W,F   Chlorhexidine  Gluconate Cloth  6 each Topical Q0600   cinacalcet   30 mg Oral Q M,W,F   [START ON 11/15/2023] collagenase    Topical Daily   darbepoetin (ARANESP ) injection - DIALYSIS  100 mcg Subcutaneous Q Wed-1800   feeding supplement (NEPRO CARB STEADY)  237 mL Oral BID BM   liver oil-zinc  oxide   Topical BID   metoprolol  tartrate  25 mg Oral Q6H   mupirocin  ointment   Nasal BID   nystatin    Topical TID   pantoprazole   80 mg Oral Daily   PARoxetine   20 mg Oral QHS   sodium hypochlorite   Irrigation BID   sucroferric oxyhydroxide  1,000 mg Oral TID WC   Continuous Infusions:  ceFEPime  (MAXIPIME ) IV Stopped (11/11/23 1852)   heparin  1,900 Units/hr (11/13/23 1101)   vancomycin  Stopped (11/11/23 1742)     LOS: 10 days    Time spent: 55 minutes    Chrystian Ressler D Mason Sole, DO [facility]  If 7PM-7AM, please contact night-coverage [email or web address] 11/13/2023, 11:52 AM

## 2023-11-14 ENCOUNTER — Inpatient Hospital Stay (HOSPITAL_COMMUNITY)

## 2023-11-14 DIAGNOSIS — L03317 Cellulitis of buttock: Secondary | ICD-10-CM | POA: Diagnosis not present

## 2023-11-14 DIAGNOSIS — R509 Fever, unspecified: Secondary | ICD-10-CM | POA: Diagnosis not present

## 2023-11-14 LAB — GLUCOSE, CAPILLARY
Glucose-Capillary: 147 mg/dL — ABNORMAL HIGH (ref 70–99)
Glucose-Capillary: 152 mg/dL — ABNORMAL HIGH (ref 70–99)
Glucose-Capillary: 156 mg/dL — ABNORMAL HIGH (ref 70–99)

## 2023-11-14 LAB — COMPREHENSIVE METABOLIC PANEL WITH GFR
ALT: 8 U/L (ref 0–44)
AST: 11 U/L — ABNORMAL LOW (ref 15–41)
Albumin: 2.4 g/dL — ABNORMAL LOW (ref 3.5–5.0)
Alkaline Phosphatase: 74 U/L (ref 38–126)
Anion gap: 15 (ref 5–15)
BUN: 27 mg/dL — ABNORMAL HIGH (ref 6–20)
CO2: 26 mmol/L (ref 22–32)
Calcium: 9.5 mg/dL (ref 8.9–10.3)
Chloride: 93 mmol/L — ABNORMAL LOW (ref 98–111)
Creatinine, Ser: 3.84 mg/dL — ABNORMAL HIGH (ref 0.44–1.00)
GFR, Estimated: 13 mL/min — ABNORMAL LOW (ref >60)
Glucose, Bld: 162 mg/dL — ABNORMAL HIGH (ref 70–99)
Potassium: 3.4 mmol/L — ABNORMAL LOW (ref 3.5–5.1)
Sodium: 134 mmol/L — ABNORMAL LOW (ref 135–145)
Total Bilirubin: 0.9 mg/dL (ref 0.0–1.2)
Total Protein: 5.9 g/dL — ABNORMAL LOW (ref 6.5–8.1)

## 2023-11-14 LAB — APTT
aPTT: 84 s — ABNORMAL HIGH (ref 24–36)
aPTT: 92 s — ABNORMAL HIGH (ref 24–36)

## 2023-11-14 LAB — CBC
HCT: 29 % — ABNORMAL LOW (ref 36.0–46.0)
Hemoglobin: 8.8 g/dL — ABNORMAL LOW (ref 12.0–15.0)
MCH: 27.7 pg (ref 26.0–34.0)
MCHC: 30.3 g/dL (ref 30.0–36.0)
MCV: 91.2 fL (ref 80.0–100.0)
Platelets: 109 10*3/uL — ABNORMAL LOW (ref 150–400)
RBC: 3.18 MIL/uL — ABNORMAL LOW (ref 3.87–5.11)
RDW: 18.6 % — ABNORMAL HIGH (ref 11.5–15.5)
WBC: 13.2 10*3/uL — ABNORMAL HIGH (ref 4.0–10.5)
nRBC: 0.2 % (ref 0.0–0.2)

## 2023-11-14 LAB — MAGNESIUM: Magnesium: 1.9 mg/dL (ref 1.7–2.4)

## 2023-11-14 LAB — HEPARIN LEVEL (UNFRACTIONATED): Heparin Unfractionated: >1.1 [IU]/mL — ABNORMAL HIGH (ref 0.30–0.70)

## 2023-11-14 MED ORDER — LORAZEPAM 2 MG/ML IJ SOLN
1.0000 mg | INTRAMUSCULAR | Status: DC | PRN
  Administered 2023-11-14 – 2023-11-17 (×10): 1 mg via INTRAVENOUS
  Filled 2023-11-14 (×10): qty 1

## 2023-11-14 MED ORDER — INSULIN ASPART 100 UNIT/ML IJ SOLN
0.0000 [IU] | INTRAMUSCULAR | Status: DC
  Administered 2023-11-14: 1 [IU] via SUBCUTANEOUS
  Administered 2023-11-14: 2 [IU] via SUBCUTANEOUS
  Administered 2023-11-15 (×2): 1 [IU] via SUBCUTANEOUS
  Administered 2023-11-15: 2 [IU] via SUBCUTANEOUS
  Administered 2023-11-16 – 2023-11-17 (×7): 1 [IU] via SUBCUTANEOUS

## 2023-11-14 MED ORDER — NEPRO/CARBSTEADY PO LIQD
1000.0000 mL | ORAL | Status: DC
  Administered 2023-11-14 – 2023-11-15 (×2): 1000 mL

## 2023-11-14 MED ORDER — RENA-VITE PO TABS
1.0000 | ORAL_TABLET | Freq: Every day | ORAL | Status: DC
  Administered 2023-11-14 – 2023-11-16 (×3): 1
  Filled 2023-11-14 (×3): qty 1

## 2023-11-14 NOTE — Progress Notes (Signed)
 PROGRESS NOTE    CADIENCE BRADFIELD  FAO:[id number] DOB: 1966-05-10 DOA: 11/03/2023 PCP: Forest Idol, NP   Brief Narrative:    The patient is a 58 y.o. female with medical history significant of  ESRD on HD (MWF) COPD, hypertension, NASH, AOCD, left eye blindness, type 2 diabetes mellitus with retinopathy, RLS, CHF who presents to the emergency department due to lower abdominal pain, nausea, NBNB vomiting.  She is found to have buttock cellulitis and was initiated on antibiotics and her fever has resolved and leukocytosis is improving.  CT scan of pelvis demonstrated no drainable abscess.  She was undergoing hemodialysis on 4/16 and went into atrial fibrillation with RVR and was transferred to ICU on heparin  drip and amiodarone  which has now been converted to oral dosing.  She is now requiring NG tube for feeding, but has pulled this out prior to initiation of tube feeds.  Assessment & Plan:   Principal Problem:   Acute febrile illness Active Problems:   GERD   Morbid obesity (HCC)   RESTLESS LEG SYNDROME   OSA on CPAP   Abdominal pain   Nausea & vomiting   Lactic acidosis   Thrombocytopenia (HCC)   Cellulitis of right buttock  Assessment and Plan:   Buttock Cellulitis:  Initially presented with fever, started empirically on vancomycin  and Zosyn . Fever had resolved so antibiotics were discontinued but  Started having drainage from right buttock, general surgery was consulted for possible abscess.  CT of the pelvis showed no drainable abscess but showed cellulitis. Restarted vancomycin  and cefepime . May need to re-image given worsening drainage and WBC. WBC Trend: -Wound care RN consulted.  Lactic acid level was not elevated again but procalcitonin was 6.07.   - Repeat general surgery evaluation due to worsening wound, discussed with Dr. Larrie Po who evaluated wound on 4/17 -Currently on antibiotics as below day 12/14 -NG tube placed by general surgery 4/19, but this has already been pulled  out and will need to be replaced.  Appreciate dietitian tube feeding recommendations.   New Onset A Fib w/RVR / Elevated Troponin: Checked EKG and showed atrial fibrillation this tracing as last tracing on 8 Place showed normal sinus rhythm.  Changed her Eliquis  to heparin  drip.  Troponin was elevated at 330.  CHA2DS2-VASc score is at least 3-4.  Recent echocardiogram done a few weeks ago and showed normal LVEF.  Initiated on Cardizem  drip but then became extremely hypotensive so this was discontinued and she had to begin fluids and albumin .   - Appreciate cardiology recommendations with IV amiodarone  to oral amiodarone  today and uses metoprolol  every 6 hours for heart rate control with plans to hold for blood pressure drops -Continue heparin  drip for now until closer to discharge   Acute encephalopathy/hypersomnolence:  Likely in setting of opioid overdose. ABG showed pCO2 42 yesterday; CT head showed No evidence of acute intracranial abnormality. Resolved after getting IV Narcan . Will discontinue oxycodone  at this time. Will continue with only Tylenol  as needed for now -Careful use of IV Dilaudid  for pain management   Pubic symphysis inflammation:  CT pelvis shows erosive changes at the pubic symphysis likely from inflammatory process versus osteitis versus infection. 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 Nephrology was consulted for maintenance dialysis in the morning per EDP. Patient has been missing hemodialysis due to pain, nephrology recommending palliative care consult for goals of care. Palliative care consulted, continue dialysis with next session 4/18   Hypertension ->  Hypotension: Blood pressure had been elevated but then dropped in the setting of her A-fib with RVR and diltiazem  drip.  Hold antihypertensive and was given albumin  25 g and IV fluid bolus.  To monitor blood pressures per protocol.  If not improving may need pressors.  Continue to monitor blood pressures per protocol.  Last blood pressure reading was 113/58 but did drop as low as 74/22   History of Calciphylaxis: Continue sensipar  as well as Hectorol     Thrombocytopenia:  -Stable, continue to monitor CBC -Likely due to splenic sequestration and infection.  Continue with broad-spectrum antibiotics as above   Splenic Infarct: Hold Eliquis  and change to Heparin  gtt given A Fib w/ RVR and elevated Troponin   OSA: Continue CPAP.     Restless Leg Syndrome: No longer on Gabapentin    GERD/GI Prophylaxis: C/w PPI with Pantoprazole  80 mg po Daily   Depression, Anxiety, Bipolar Disorder: C/w Buspirone  7.5 mg po BID and Paroxetine  20 mg po at bedtime   Normocytic Anemia/Anemia of Chronic Kidney Disease: Hemoglobin/hematocrit is now 9.1/29.4.  Continue with darbepoetin alpha 100 mcg subcu every Wednesday and continue monitor for signs or symptoms of bleeding given that she is now being anticoag with heparin  drip.  Check anemia panel in AM.  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  Started on triple antibiotic ophthalmic ointment twice daily   Hypoalbuminemia: Patient's Albumin  trending down and now 2.1 on last check. CTM and Trend and repeat CMP in the AM -Plan to try tube feeds 4/19   Class III (Morbid) Obesity: Complicates overall prognosis and care -Estimated body mass index is 40.16 kg/m as calculated from the following:   Height as of this encounter: 5\' 8"  (1.727 m).   Weight as of this encounter: 119.8  kg. Weight Loss and Dietary Counseling given  New concern for UTI -Continue current antibiotics and send off urine culture pending    DVT prophylaxis:Heparin  drip Code Status: Full Family Communication: None at bedside Disposition Plan:  Status is: Inpatient Remains inpatient appropriate because: Need for IV medications.   Consultants:  Cardiology Nephrology General Surgery Palliative care medicine  Procedures:  As above  Antimicrobials:  Anti-infectives (From admission, onward)    Start     Dose/Rate Route Frequency Ordered Stop   11/09/23 1600  ceFEPIme  (MAXIPIME ) 2 g in sodium chloride  0.9 % 100 mL IVPB        2 g 200 mL/hr over 30 Minutes Intravenous Every M-W-F (Hemodialysis) 11/08/23 0854     11/09/23 1600  vancomycin  (VANCOCIN ) IVPB 1000 mg/200 mL premix        1,000 mg 200 mL/hr over 60 Minutes Intravenous Every M-W-F (Hemodialysis) 11/08/23 0855     11/08/23 0930  vancomycin  (VANCOCIN ) IVPB 1000 mg/200 mL premix        1,000 mg 200 mL/hr over 60 Minutes Intravenous  Once 11/08/23 0824 11/08/23 1201   11/08/23 0900  ceFEPIme  (MAXIPIME ) 2 g in sodium chloride  0.9 % 100 mL IVPB        2 g  200 mL/hr over 30 Minutes Intravenous  Once 11/08/23 0804 11/08/23 1008   11/05/23 1130  piperacillin -tazobactam (ZOSYN ) 2.25 g in sodium chloride  0.9 % 50 mL IVPB  Status:  Discontinued        2.25 g 100 mL/hr over 30 Minutes Intravenous Every 8 hours 11/05/23 1054 11/05/23 1127   11/04/23 1200  vancomycin  (VANCOCIN ) IVPB 1000 mg/200 mL premix  Status:  Discontinued        1,000 mg 200 mL/hr over 60 Minutes Intravenous Every M-W-F (Hemodialysis) 11/04/23 0250 11/05/23 1127   11/04/23 0600  piperacillin -tazobactam (ZOSYN ) IVPB 2.25 g  Status:  Discontinued        2.25 g 100 mL/hr over 30 Minutes Intravenous Every 8 hours 11/04/23 0250 11/05/23 1054   11/03/23 2315  vancomycin  (VANCOREADY) IVPB 2000 mg/400 mL        2,000 mg 200 mL/hr over 120 Minutes Intravenous  Once 11/03/23  2300 11/04/23 0229   11/03/23 2315  piperacillin -tazobactam (ZOSYN ) IVPB 3.375 g        3.375 g 100 mL/hr over 30 Minutes Intravenous  Once 11/03/23 2300 11/04/23 0024      Subjective: Patient seen and evaluated today with no new acute complaints or concerns. No acute concerns or events noted overnight.  She had a feeding tube placed earlier today, but this was pulled out.  Objective: Vitals:   11/14/23 0742 11/14/23 0900 11/14/23 1000 11/14/23 1100  BP:  (!) 120/59 116/70 (!) 118/36  Pulse:  86 90 88  Resp:  15 16 12   Temp: (!) 97.4 F (36.3 C)     TempSrc: Axillary     SpO2:  96% 97% 98%  Weight:      Height:        Intake/Output Summary (Last 24 hours) at 11/14/2023 1105 Last data filed at 11/14/2023 1100 Gross per 24 hour  Intake 1091.89 ml  Output 600 ml  Net 491.89 ml   Filed Weights   11/11/23 1812 11/13/23 1000 11/13/23 1400  Weight: 114 kg 107 kg 112 kg    Examination:  General exam: Appears calm and comfortable, somnolent but arousable Respiratory system: Clear to auscultation. Respiratory effort normal.  4 L nasal cannula Cardiovascular system: S1 & S2 heard, irregular. Gastrointestinal system: Abdomen is soft Central nervous system: Alert and awake Extremities: No edema Skin: Wound as noted in chart Psychiatry: Flat affect.    Data Reviewed: I have personally reviewed following labs and imaging studies  CBC: Recent Labs  Lab 11/09/23 1835 11/11/23 1320 11/12/23 0551 11/13/23 0413 11/14/23 0435  WBC 18.3* 22.6* 14.8* 14.2* 13.2*  NEUTROABS  --   --  13.0*  --   --   HGB 8.9* 9.1* 8.7* 8.9* 8.8*  HCT 29.4* 29.4* 29.0* 29.5* 29.0*  MCV 90.7 90.2 91.2 90.5 91.2  PLT 112* 110* 107* 111* 109*   Basic Metabolic Panel: Recent Labs  Lab 11/09/23 1835 11/11/23 1320 11/12/23 0551 11/13/23 0413 11/14/23 0435  NA 132* 135 133* 133* 134*  K 3.8 3.4* 3.3* 3.6 3.4*  CL 93* 92* 95* 95* 93*  CO2 25 25 26 27 26   GLUCOSE 154* 175* 177* 162* 162*   BUN 54* 43* 27* 35* 27*  CREATININE 7.96* 6.12* 3.73* 4.73* 3.84*  CALCIUM  9.4 9.2 8.9 8.9 9.5  MG  --   --  1.9 1.9 1.9  PHOS 4.2 2.9 2.9  --   --    GFR: Estimated Creatinine Clearance: 20.3 mL/min (A) (by C-G formula based  on SCr of 3.84 mg/dL (H)). Liver Function Tests: Recent Labs  Lab 11/09/23 1835 11/11/23 1320 11/12/23 0551 11/13/23 0413 11/14/23 0435  AST  --   --  10* 11* 11*  ALT  --   --  9 8 8   ALKPHOS  --   --  75 75 74  BILITOT  --   --  1.4* 0.8 0.9  PROT  --   --  5.6* 5.6* 5.9*  ALBUMIN  2.1* 1.9* 2.2* 2.1* 2.4*   No results for input(s): "LIPASE", "AMYLASE" in the last 168 hours. No results for input(s): "AMMONIA" in the last 168 hours. Coagulation Profile: No results for input(s): "INR", "PROTIME" in the last 168 hours. Cardiac Enzymes: No results for input(s): "CKTOTAL", "CKMB", "CKMBINDEX", "TROPONINI" in the last 168 hours. BNP (last 3 results) No results for input(s): "PROBNP" in the last 8760 hours. HbA1C: No results for input(s): "HGBA1C" in the last 72 hours. CBG: Recent Labs  Lab 11/12/23 1709  GLUCAP 159*   Lipid Profile: No results for input(s): "CHOL", "HDL", "LDLCALC", "TRIG", "CHOLHDL", "LDLDIRECT" in the last 72 hours. Thyroid  Function Tests: Recent Labs    11/11/23 1441 11/12/23 0722  TSH 4.577*  --   FREET4  --  1.41*   Anemia Panel: No results for input(s): "VITAMINB12", "FOLATE", "FERRITIN", "TIBC", "IRON ", "RETICCTPCT" in the last 72 hours. Sepsis Labs: Recent Labs  Lab 11/11/23 1646 11/11/23 1902 11/12/23 0551  PROCALCITON 6.07  --  6.57  LATICACIDVEN 1.0 1.1  --     Recent Results (from the past 240 hours)  MRSA Next Gen by PCR, Nasal     Status: Abnormal   Collection Time: 11/05/23 12:00 PM   Specimen: Nasal Mucosa; Nasal Swab  Result Value Ref Range Status   MRSA by PCR Next Gen DETECTED (A) NOT DETECTED Final    Comment: RESULT CALLED TO, READ BACK BY AND VERIFIED WITH: D HAIRSTON AT 1356 11/05/23 BY A  WILSON (NOTE) The GeneXpert MRSA Assay (FDA approved for NASAL specimens only), is one component of a comprehensive MRSA colonization surveillance program. It is not intended to diagnose MRSA infection nor to guide or monitor treatment for MRSA infections. Test performance is not FDA approved in patients less than 60 years old. Performed at [facility], [address] [place], [place] [address]   MRSA Next Gen by PCR, Nasal     Status: Abnormal   Collection Time: 11/11/23  2:06 PM   Specimen: Nasal Mucosa; Nasal Swab  Result Value Ref Range Status   MRSA by PCR Next Gen DETECTED (A) NOT DETECTED Final    Comment: RESULT CALLED TO, READ BACK BY AND VERIFIED WITH: JENNIFER KING @ 1731 ON 11/11/23 C VARNER (NOTE) The GeneXpert MRSA Assay (FDA approved for NASAL specimens only), is one component of a comprehensive MRSA colonization surveillance program. It is not intended to diagnose MRSA infection nor to guide or monitor treatment for MRSA infections. Test performance is not FDA approved in patients less than 54 years old. Performed at [facility], [address], [place], [place] [address]          Radiology Studies: DG Chest [facility] 1 View Result Date: 11/14/2023 CLINICAL DATA:  Nasogastric verification EXAM: PORTABLE CHEST 1 VIEW COMPARISON:  11/03/2023 FINDINGS: Nasogastric tube enters the abdomen with its tip in the expected location of the mid body of the stomach. This should be good to use. Artifact otherwise overlying the region. IMPRESSION: Nasogastric tube tip in the expected location of  the mid body of the stomach. Electronically Signed   By: Bettylou Brunner M.D.   On: 11/14/2023 11:01         Scheduled Meds:  amiodarone   200 mg Oral BID   atorvastatin   20 mg Oral Daily   bacitracin -polymyxin b    Left Eye BID   calcitRIOL   2 mcg Oral Q M,W,F   Chlorhexidine  Gluconate Cloth  6 each Topical Q0600   cinacalcet   30 mg Oral Q M,W,F   [START ON 11/15/2023] collagenase     Topical Daily   darbepoetin (ARANESP ) injection - DIALYSIS  100 mcg Subcutaneous Q Wed-1800   insulin  aspart  0-6 Units Subcutaneous Q4H   liver oil-zinc  oxide   Topical BID   metoprolol  tartrate  25 mg Oral Q6H   midodrine   10 mg Oral TID WC   multivitamin  1 tablet Per Tube QHS   mupirocin  ointment   Nasal BID   nystatin    Topical TID   pantoprazole   80 mg Oral Daily   sodium hypochlorite   Irrigation BID   sucroferric oxyhydroxide  1,000 mg Oral TID WC   Continuous Infusions:  ceFEPime  (MAXIPIME ) IV Stopped (11/13/23 1741)   feeding supplement (NEPRO CARB STEADY)     heparin  2,150 Units/hr (11/14/23 1100)   vancomycin  Stopped (11/13/23 1906)     LOS: 11 days    Time spent: 55 minutes    Yaret Hush D Mason Sole, DO [facility]  If 7PM-7AM, please contact night-coverage [email or web address] 11/14/2023, 11:05 AM

## 2023-11-14 NOTE — Progress Notes (Signed)
 Subjective: Patient not coherent.  Combative.  Objective: Vital signs in last 24 hours: Temp:  [97 F (36.1 C)-98.3 F (36.8 C)] 97.4 F (36.3 C) (04/19 0742) Pulse Rate:  [65-94] 88 (04/19 1100) Resp:  [12-25] 12 (04/19 1100) BP: (70-150)/(15-89) 118/36 (04/19 1100) SpO2:  [95 %-100 %] 98 % (04/19 1100) Weight:  [161 kg] 112 kg (04/18 1400) Last BM Date : 11/10/23  Intake/Output from previous day: 04/18 0701 - 04/19 0700 In: 1911.3 [P.O.:480; I.V.:1095.9; IV Piggyback:335.4] Out: 600  Intake/Output this shift: Total I/O In: 85.3 [I.V.:85.3] Out: -   Skin: Right buttock wound with some necrotic skin present.  No abscess cavity identified on digital examination.  Lab Results:  Recent Labs    11/13/23 0413 11/14/23 0435  WBC 14.2* 13.2*  HGB 8.9* 8.8*  HCT 29.5* 29.0*  PLT 111* 109*   BMET Recent Labs    11/13/23 0413 11/14/23 0435  NA 133* 134*  K 3.6 3.4*  CL 95* 93*  CO2 27 26  GLUCOSE 162* 162*  BUN 35* 27*  CREATININE 4.73* 3.84*  CALCIUM  8.9 9.5   PT/INR No results for input(s): "LABPROT", "INR" in the last 72 hours.  Studies/Results: DG Chest Port 1 View Result Date: 11/14/2023 CLINICAL DATA:  Nasogastric verification EXAM: PORTABLE CHEST 1 VIEW COMPARISON:  11/03/2023 FINDINGS: Nasogastric tube enters the abdomen with its tip in the expected location of the mid body of the stomach. This should be good to use. Artifact otherwise overlying the region. IMPRESSION: Nasogastric tube tip in the expected location of the mid body of the stomach. Electronically Signed   By: Bettylou Brunner M.D.   On: 11/14/2023 11:01    Anti-infectives: Anti-infectives (From admission, onward)    Start     Dose/Rate Route Frequency Ordered Stop   11/09/23 1600  ceFEPIme  (MAXIPIME ) 2 g in sodium chloride  0.9 % 100 mL IVPB        2 g 200 mL/hr over 30 Minutes Intravenous Every M-W-F (Hemodialysis) 11/08/23 0854     11/09/23 1600  vancomycin  (VANCOCIN ) IVPB 1000 mg/200 mL  premix        1,000 mg 200 mL/hr over 60 Minutes Intravenous Every M-W-F (Hemodialysis) 11/08/23 0855     11/08/23 0930  vancomycin  (VANCOCIN ) IVPB 1000 mg/200 mL premix        1,000 mg 200 mL/hr over 60 Minutes Intravenous  Once 11/08/23 0824 11/08/23 1201   11/08/23 0900  ceFEPIme  (MAXIPIME ) 2 g in sodium chloride  0.9 % 100 mL IVPB        2 g 200 mL/hr over 30 Minutes Intravenous  Once 11/08/23 0804 11/08/23 1008   11/05/23 1130  piperacillin -tazobactam (ZOSYN ) 2.25 g in sodium chloride  0.9 % 50 mL IVPB  Status:  Discontinued        2.25 g 100 mL/hr over 30 Minutes Intravenous Every 8 hours 11/05/23 1054 11/05/23 1127   11/04/23 1200  vancomycin  (VANCOCIN ) IVPB 1000 mg/200 mL premix  Status:  Discontinued        1,000 mg 200 mL/hr over 60 Minutes Intravenous Every M-W-F (Hemodialysis) 11/04/23 0250 11/05/23 1127   11/04/23 0600  piperacillin -tazobactam (ZOSYN ) IVPB 2.25 g  Status:  Discontinued        2.25 g 100 mL/hr over 30 Minutes Intravenous Every 8 hours 11/04/23 0250 11/05/23 1054   11/03/23 2315  vancomycin  (VANCOREADY) IVPB 2000 mg/400 mL        2,000 mg 200 mL/hr over 120 Minutes Intravenous  Once 11/03/23 2300  11/04/23 0229   11/03/23 2315  piperacillin -tazobactam (ZOSYN ) IVPB 3.375 g        3.375 g 100 mL/hr over 30 Minutes Intravenous  Once 11/03/23 2300 11/04/23 0024       Assessment/Plan: Impression: Right buttock wound without abscess.  Continues to be infected.  Patient now is on heparin  drip precluding sharp debridement.  I did place an NG tube for tube feedings, but patient has pulled this out.  Will check to see whether it has been placed back in.  LOS: 11 days    Alanda Allegra 11/14/2023

## 2023-11-14 NOTE — Progress Notes (Signed)
 PHARMACY - ANTICOAGULATION CONSULT NOTE  Pharmacy Consult for Heparin  Indication: atrial fibrillation  Allergies  Allergen Reactions   Codeine  Nausea And Vomiting   Tape Other (See Comments) and Rash    Pull skin off.  Paper tape is ok    Patient Measurements: Height: 5\' 4"  (162.6 cm) Weight: 112 kg (246 lb 14.6 oz) (Sizewise bedscale (? pre-weight)) IBW/kg (Calculated) : 54.7 HEPARIN  DW (KG): 89.4  Vital Signs: Temp: 97.4 F (36.3 C) (04/19 1335) Temp Source: Axillary (04/19 1335) BP: 109/46 (04/19 1300) Pulse Rate: 80 (04/19 1300)  Labs: Recent Labs    11/11/23 1441 11/11/23 1646 11/11/23 1646 11/12/23 0551 11/12/23 1439 11/13/23 0413 11/13/23 1737 11/14/23 0435 11/14/23 1322  HGB  --   --    < > 8.7*  --  8.9*  --  8.8*  --   HCT  --   --   --  29.0*  --  29.5*  --  29.0*  --   PLT  --   --   --  107*  --  111*  --  109*  --   APTT  --  39*   < > 41*   < > 58* 47* 84* 92*  HEPARINUNFRC  --  >1.10*   < > >1.10*  --  >1.10*  --  >1.10*  --   CREATININE  --   --   --  3.73*  --  4.73*  --  3.84*  --   TROPONINIHS 330* 322*  --   --   --   --   --   --   --    < > = values in this interval not displayed.    Estimated Creatinine Clearance: 20.3 mL/min (A) (by C-G formula based on SCr of 3.84 mg/dL (H)).   Medical History: Past Medical History:  Diagnosis Date   Acid reflux    Anemia    Arthritis    Bipolar 1 disorder (HCC)    Blindness of left eye    left eye removed   Carpal tunnel syndrome, bilateral    Cervical radiculopathy    CHF (congestive heart failure) (HCC)    Constipation    COPD (chronic obstructive pulmonary disease) (HCC) 2014   Degenerative disc disease, thoracic    Depression    Diabetes mellitus    Type II   Diabetic retinopathy (HCC)    End stage kidney disease (HCC)    M, W, F [facility]   Gout    Hypertension    Non-alcoholic cirrhosis (HCC)    Noncompliance with medication regimen    Obesity (BMI 30-39.9)    OSA  (obstructive sleep apnea)    cpap   Panic attack    RLS (restless legs syndrome)     Medications:  Medications Prior to Admission  Medication Sig Dispense Refill Last Dose/Taking   ADVAIR DISKUS 250-50 MCG/ACT AEPB Inhale 1 puff into the lungs 2 (two) times daily.   Past Week   albuterol  (PROVENTIL ) (2.5 MG/3ML) 0.083% nebulizer solution Take 3 mLs (2.5 mg total) by nebulization every 12 (twelve) hours as needed for wheezing or shortness of breath. 75 mL 0 Past Week   ALPRAZolam  (XANAX ) 1 MG tablet Take 1 tablet (1 mg total) by mouth 4 (four) times daily as needed for anxiety. (Patient taking differently: Take 1 mg by mouth 4 (four) times daily.) 9 tablet 0 11/01/2023   apixaban  (ELIQUIS ) 5 MG TABS tablet Take 2 tablets (10 mg total)  by mouth 2 (two) times daily for 7 days, THEN 1 tablet (5 mg total) 2 (two) times daily for 23 days. 74 tablet 0 10/30/2023   Artificial Tear Ointment (DRY EYES OP) Place 1 drop into the right eye 2 (two) times daily.   11/01/2023   atorvastatin  (LIPITOR) 20 MG tablet Take 20 mg by mouth daily.   11/01/2023   atropine  (ISOPTO ATROPINE ) 1 % ophthalmic solution Place 1 drop into the left eye 3 (three) times daily.   11/01/2023   brimonidine  (ALPHAGAN ) 0.2 % ophthalmic solution Place 1 drop into the left eye 3 (three) times daily.   11/01/2023   busPIRone  (BUSPAR ) 7.5 MG tablet Take 1 tablet (7.5 mg total) by mouth 2 (two) times daily. 7 tablet 0 11/01/2023   cinacalcet  (SENSIPAR ) 60 MG tablet Take 60 mg by mouth daily.   11/01/2023   colchicine  0.6 MG tablet TAKE 1 TABLET(0.6 MG) BY MOUTH DAILY 30 tablet 0 11/01/2023   doxercalciferol  (HECTOROL ) 4 MCG/2ML injection Inject 0.5 mLs (1 mcg total) into the vein every Monday, Wednesday, and Friday with hemodialysis. 2 mL  10/30/2023   gabapentin  (NEURONTIN ) 100 MG capsule Take 1 capsule (100 mg total) by mouth at bedtime. (Patient taking differently: Take 300 mg by mouth at bedtime.) 7 capsule 0 11/01/2023   HYDROcodone -acetaminophen  (NORCO)  10-325 MG tablet Take 1 tablet by mouth 4 (four) times daily.   11/01/2023   insulin  glargine (LANTUS  SOLOSTAR) 100 UNIT/ML Solostar Pen Inject 30 Units into the skin daily.   11/01/2023   latanoprost  (XALATAN ) 0.005 % ophthalmic solution Place 1 drop into the left eye 2 (two) times daily.   11/01/2023   omeprazole  (PRILOSEC) 40 MG capsule Take 40 mg by mouth daily.   11/01/2023   ondansetron  (ZOFRAN ) 4 MG tablet TAKE (1) TABLET THREE TIMES DAILY AS NEEDED FOR NAUSEA.   11/01/2023   PARoxetine  (PAXIL ) 20 MG tablet Take 1 tablet (20 mg total) by mouth every evening. This is to prevent panic attacks (Patient taking differently: Take 20 mg by mouth at bedtime.) 7 tablet 0 11/01/2023   VELPHORO  500 MG chewable tablet Chew 1,000 mg by mouth 3 (three) times daily.   11/01/2023   Blood Glucose Monitoring Suppl (FORA V30A BLOOD GLUCOSE SYSTEM) DEVI       FORACARE PREMIUM V10 TEST test strip        Assessment: Patient presented with buttock cellulitis. ON chronic anticoagulation with eliquis  for afib. Patient having rhythm changes and MD concerned. Diltiazem  infusion started, cardiology to evaluate. MD asked pharmacy to transition to heparin  for now. Will follow APTT and HL until correlates. Last dose of eliquis  4/16 at 0830  Goal of Therapy:  Heparin  level 0.3-0.7 units/ml aPTT 66-102 seconds Monitor platelets by anticoagulation protocol: Yes  4/18@1737 : aPTT 47, subtherapeutic 4/19@0435 : aPTT 84, therapeutic x 1 (and daily anti-Xa level >1.10, not correlating) 4/19@1322 : aPTT 92, therapeutic x 2   Plan: Continue heparin  infusion at 2150 units/hour. Check aPTT and heparin  level tomorrow with morning labs Look for correlation between the two, after which will monitor heparin  level Continue to monitor CBC daily while on heparin  infusion.   Will M. Alva Jewels, PharmD Clinical Pharmacist 11/14/2023 2:19 PM

## 2023-11-14 NOTE — Plan of Care (Signed)
  Problem: Education: Goal: Knowledge of General Education information will improve Description: Including pain rating scale, medication(s)/side effects and non-pharmacologic comfort measures Outcome: Not Progressing   Problem: Health Behavior/Discharge Planning: Goal: Ability to manage health-related needs will improve Outcome: Not Progressing   Problem: Clinical Measurements: Goal: Ability to maintain clinical measurements within normal limits will improve Outcome: Not Progressing Goal: Will remain free from infection Outcome: Not Progressing Goal: Diagnostic test results will improve Outcome: Not Progressing Goal: Respiratory complications will improve Outcome: Not Progressing Goal: Cardiovascular complication will be avoided Outcome: Not Progressing   Problem: Activity: Goal: Risk for activity intolerance will decrease Outcome: Not Progressing   Problem: Nutrition: Goal: Adequate nutrition will be maintained Outcome: Not Progressing   Problem: Coping: Goal: Level of anxiety will decrease Outcome: Not Progressing   Problem: Elimination: Goal: Will not experience complications related to bowel motility Outcome: Not Progressing Goal: Will not experience complications related to urinary retention Outcome: Not Progressing   Problem: Pain Managment: Goal: General experience of comfort will improve and/or be controlled Outcome: Not Progressing   Problem: Safety: Goal: Ability to remain free from injury will improve Outcome: Not Progressing   Problem: Skin Integrity: Goal: Risk for impaired skin integrity will decrease Outcome: Not Progressing   Problem: Clinical Measurements: Goal: Ability to avoid or minimize complications of infection will improve Outcome: Not Progressing   Problem: Skin Integrity: Goal: Skin integrity will improve Outcome: Not Progressing   Problem: Clinical Measurements: Goal: Ability to avoid or minimize complications of infection will  improve Outcome: Not Progressing   Problem: Skin Integrity: Goal: Skin integrity will improve Outcome: Not Progressing   Problem: Education: Goal: Ability to describe self-care measures that may prevent or decrease complications (Diabetes Survival Skills Education) will improve Outcome: Not Progressing Goal: Individualized Educational Video(s) Outcome: Not Progressing   Problem: Coping: Goal: Ability to adjust to condition or change in health will improve Outcome: Not Progressing   Problem: Fluid Volume: Goal: Ability to maintain a balanced intake and output will improve Outcome: Not Progressing   Problem: Health Behavior/Discharge Planning: Goal: Ability to identify and utilize available resources and services will improve Outcome: Not Progressing Goal: Ability to manage health-related needs will improve Outcome: Not Progressing   Problem: Metabolic: Goal: Ability to maintain appropriate glucose levels will improve Outcome: Not Progressing   Problem: Nutritional: Goal: Maintenance of adequate nutrition will improve Outcome: Not Progressing Goal: Progress toward achieving an optimal weight will improve Outcome: Not Progressing   Problem: Skin Integrity: Goal: Risk for impaired skin integrity will decrease Outcome: Not Progressing   Problem: Tissue Perfusion: Goal: Adequacy of tissue perfusion will improve Outcome: Not Progressing

## 2023-11-14 NOTE — Progress Notes (Signed)
 PHARMACY - ANTICOAGULATION CONSULT NOTE  Pharmacy Consult for Heparin  Indication: atrial fibrillation  Allergies  Allergen Reactions   Codeine  Nausea And Vomiting   Tape Other (See Comments) and Rash    Pull skin off.  Paper tape is ok    Patient Measurements: Height: 5\' 4"  (162.6 cm) Weight: 112 kg (246 lb 14.6 oz) (Sizewise bedscale (? pre-weight)) IBW/kg (Calculated) : 54.7 HEPARIN  DW (KG): 89.4  Vital Signs: Temp: 97.4 F (36.3 C) (04/19 0742) Temp Source: Axillary (04/19 0742) BP: 124/55 (04/19 0700) Pulse Rate: 92 (04/19 0700)  Labs: Recent Labs    11/11/23 1320 11/11/23 1441 11/11/23 1646 11/12/23 0551 11/12/23 1439 11/13/23 0413 11/13/23 1737 11/14/23 0435  HGB  --   --   --  8.7*  --  8.9*  --  8.8*  HCT  --   --   --  29.0*  --  29.5*  --  29.0*  PLT  --   --   --  107*  --  111*  --  109*  APTT   < >  --  39* 41*   < > 58* 47* 84*  HEPARINUNFRC   < >  --  >1.10* >1.10*  --  >1.10*  --  >1.10*  CREATININE  --   --   --  3.73*  --  4.73*  --  3.84*  TROPONINIHS  --  330* 322*  --   --   --   --   --    < > = values in this interval not displayed.    Estimated Creatinine Clearance: 20.3 mL/min (A) (by C-G formula based on SCr of 3.84 mg/dL (H)).   Medical History: Past Medical History:  Diagnosis Date   Acid reflux    Anemia    Arthritis    Bipolar 1 disorder (HCC)    Blindness of left eye    left eye removed   Carpal tunnel syndrome, bilateral    Cervical radiculopathy    CHF (congestive heart failure) (HCC)    Constipation    COPD (chronic obstructive pulmonary disease) (HCC) 2014   Degenerative disc disease, thoracic    Depression    Diabetes mellitus    Type II   Diabetic retinopathy (HCC)    End stage kidney disease (HCC)    M, W, F [facility] [place]   Gout    Hypertension    Non-alcoholic cirrhosis (HCC)    Noncompliance with medication regimen    Obesity (BMI 30-39.9)    OSA (obstructive sleep apnea)    cpap   Panic attack     RLS (restless legs syndrome)     Medications:  Medications Prior to Admission  Medication Sig Dispense Refill Last Dose/Taking   ADVAIR DISKUS 250-50 MCG/ACT AEPB Inhale 1 puff into the lungs 2 (two) times daily.   Past Week   albuterol  (PROVENTIL ) (2.5 MG/3ML) 0.083% nebulizer solution Take 3 mLs (2.5 mg total) by nebulization every 12 (twelve) hours as needed for wheezing or shortness of breath. 75 mL 0 Past Week   ALPRAZolam  (XANAX ) 1 MG tablet Take 1 tablet (1 mg total) by mouth 4 (four) times daily as needed for anxiety. (Patient taking differently: Take 1 mg by mouth 4 (four) times daily.) 9 tablet 0 11/01/2023   apixaban  (ELIQUIS ) 5 MG TABS tablet Take 2 tablets (10 mg total) by mouth 2 (two) times daily for 7 days, THEN 1 tablet (5 mg total) 2 (two) times daily for 23 days.  74 tablet 0 10/30/2023   Artificial Tear Ointment (DRY EYES OP) Place 1 drop into the right eye 2 (two) times daily.   11/01/2023   atorvastatin  (LIPITOR) 20 MG tablet Take 20 mg by mouth daily.   11/01/2023   atropine  (ISOPTO ATROPINE ) 1 % ophthalmic solution Place 1 drop into the left eye 3 (three) times daily.   11/01/2023   brimonidine  (ALPHAGAN ) 0.2 % ophthalmic solution Place 1 drop into the left eye 3 (three) times daily.   11/01/2023   busPIRone  (BUSPAR ) 7.5 MG tablet Take 1 tablet (7.5 mg total) by mouth 2 (two) times daily. 7 tablet 0 11/01/2023   cinacalcet  (SENSIPAR ) 60 MG tablet Take 60 mg by mouth daily.   11/01/2023   colchicine  0.6 MG tablet TAKE 1 TABLET(0.6 MG) BY MOUTH DAILY 30 tablet 0 11/01/2023   doxercalciferol  (HECTOROL ) 4 MCG/2ML injection Inject 0.5 mLs (1 mcg total) into the vein every Monday, Wednesday, and Friday with hemodialysis. 2 mL  10/30/2023   gabapentin  (NEURONTIN ) 100 MG capsule Take 1 capsule (100 mg total) by mouth at bedtime. (Patient taking differently: Take 300 mg by mouth at bedtime.) 7 capsule 0 11/01/2023   HYDROcodone -acetaminophen  (NORCO) 10-325 MG tablet Take 1 tablet by mouth 4 (four)  times daily.   11/01/2023   insulin  glargine (LANTUS  SOLOSTAR) 100 UNIT/ML Solostar Pen Inject 30 Units into the skin daily.   11/01/2023   latanoprost  (XALATAN ) 0.005 % ophthalmic solution Place 1 drop into the left eye 2 (two) times daily.   11/01/2023   omeprazole  (PRILOSEC) 40 MG capsule Take 40 mg by mouth daily.   11/01/2023   ondansetron  (ZOFRAN ) 4 MG tablet TAKE (1) TABLET THREE TIMES DAILY AS NEEDED FOR NAUSEA.   11/01/2023   PARoxetine  (PAXIL ) 20 MG tablet Take 1 tablet (20 mg total) by mouth every evening. This is to prevent panic attacks (Patient taking differently: Take 20 mg by mouth at bedtime.) 7 tablet 0 11/01/2023   VELPHORO  500 MG chewable tablet Chew 1,000 mg by mouth 3 (three) times daily.   11/01/2023   Blood Glucose Monitoring Suppl (FORA V30A BLOOD GLUCOSE SYSTEM) DEVI       FORACARE PREMIUM V10 TEST test strip        Assessment: Patient presented with buttock cellulitis. ON chronic anticoagulation with eliquis  for afib. Patient having rhythm changes and MD concerned. Diltiazem  infusion started, cardiology to evaluate. MD asked pharmacy to transition to heparin  for now. Will follow APTT and HL until correlates. Last dose of eliquis  4/16 at 0830  Goal of Therapy:  Heparin  level 0.3-0.7 units/ml aPTT 66-102 seconds Monitor platelets by anticoagulation protocol: Yes  4/18@1737 : aPTT 47, subtherapeutic 4/19@0435 : aPTT 84, therapeutic x 1 (and daily anti-Xa level >1.10, not correlating)   Plan: Continue heparin  infusion at 2150 units/hour. Check aPTT in 8 hours Recheck heparin  level tomorrow with morning labs Continue to monitor CBC daily while on heparin  infusion.   Will M. Alva Jewels, PharmD Clinical Pharmacist 11/14/2023 9:11 AM

## 2023-11-14 NOTE — Progress Notes (Signed)
 Initial Nutrition Assessment  INTERVENTION:   -Initiate Nepro @20  ml/hr via NGT, advance by 10 ml every 4 hours to goal rate of 55 ml/hr.  -Rena-vit daily via tube  -PO as tolerated and when more alert  NUTRITION DIAGNOSIS:   Increased nutrient needs related to chronic illness as evidenced by estimated needs.  GOAL:   Patient will meet greater than or equal to 90% of their needs  MONITOR:   TF tolerance, Labs  REASON FOR ASSESSMENT:   Consult Enteral/tube feeding initiation and management  ASSESSMENT:   58 y.o. female with medical history significant of  ESRD on HD (MWF) COPD, hypertension, NASH, AOCD, left eye blindness, type 2 diabetes mellitus with retinopathy, RLS, CHF who presents to the emergency department due to lower abdominal pain, nausea, NBNB vomiting.  4/8: admitted 4/9: renal diet, HD for 3.5 hours, UF 2.4L removed 4/13: 60-100% of meals 4/15: 0-100% of meals 4/16: 25% of meals, HD for 3.5 hours, UF: 500 ml removed 4/18: HD x 3 hours, UF: 300 ml  Patient had NGT placed this morning for tube feeding to be initiated this morning.  Pt was on renal diet with Nepro supplements ordered orally. Currently alert/oriented x 2. Has been somnolent this admission.  Pt was unable to complete full HD 4/18 given unsafe behaviors per nephrology note. Typically on MWF schedule for HD. PTA pt had not had HD in a week.  EDW documented as 120.5 kg. Current weight is lower than EDW: 112 kg Lowest weight this admission has been 107 kg.  Medications reviewed.  Labs reviewed: CBGs: 159 Low Na Low K   NUTRITION - FOCUSED PHYSICAL EXAM:  Unable to complete, working remotely.  Diet Order:   Diet Order             Diet renal/carb modified with fluid restriction Diet-HS Snack? Nothing; Fluid restriction: 1200 mL Fluid; Room service appropriate? Yes; Fluid consistency: Thin  Diet effective now                   EDUCATION NEEDS:   Not appropriate for education  at this time  Skin:  Skin Assessment: Skin Integrity Issues: Skin Integrity Issues:: Unstageable Unstageable: right thigh  Last BM:  4/15  Height:   Ht Readings from Last 1 Encounters:  11/11/23 5\' 4"  (1.626 m)    Weight:   Wt Readings from Last 1 Encounters:  11/13/23 112 kg    BMI:  Body mass index is 42.38 kg/m.  Estimated Nutritional Needs:   Kcal:  2300-2500  Protein:  85-105g  Fluid:  IL + UOP   Arna Better, MS, RD, LDN Inpatient Clinical Dietitian Contact via Secure chat

## 2023-11-14 NOTE — Progress Notes (Signed)
 0730 patient screaming "Help me" anxiety and pain meds given patient in bilateral mitts pulled one off. 0900 wound care completed general surgery at bedside NG tube placed  1100 patient pulled out NG tube new one placed new order for sitter, waiting on xray to start tube feeding  1350 bladder scanned due to zero urine out put bladder scan was zero

## 2023-11-15 DIAGNOSIS — R509 Fever, unspecified: Secondary | ICD-10-CM | POA: Diagnosis not present

## 2023-11-15 LAB — BASIC METABOLIC PANEL WITH GFR
Anion gap: 13 (ref 5–15)
BUN: 39 mg/dL — ABNORMAL HIGH (ref 6–20)
CO2: 27 mmol/L (ref 22–32)
Calcium: 9.5 mg/dL (ref 8.9–10.3)
Chloride: 95 mmol/L — ABNORMAL LOW (ref 98–111)
Creatinine, Ser: 4.85 mg/dL — ABNORMAL HIGH (ref 0.44–1.00)
GFR, Estimated: 10 mL/min — ABNORMAL LOW (ref >60)
Glucose, Bld: 145 mg/dL — ABNORMAL HIGH (ref 70–99)
Potassium: 3.7 mmol/L (ref 3.5–5.1)
Sodium: 135 mmol/L (ref 135–145)

## 2023-11-15 LAB — APTT: aPTT: 38 s — ABNORMAL HIGH (ref 24–36)

## 2023-11-15 LAB — GLUCOSE, CAPILLARY
Glucose-Capillary: 134 mg/dL — ABNORMAL HIGH (ref 70–99)
Glucose-Capillary: 137 mg/dL — ABNORMAL HIGH (ref 70–99)
Glucose-Capillary: 144 mg/dL — ABNORMAL HIGH (ref 70–99)
Glucose-Capillary: 158 mg/dL — ABNORMAL HIGH (ref 70–99)
Glucose-Capillary: 162 mg/dL — ABNORMAL HIGH (ref 70–99)
Glucose-Capillary: 207 mg/dL — ABNORMAL HIGH (ref 70–99)

## 2023-11-15 LAB — CBC
HCT: 29.4 % — ABNORMAL LOW (ref 36.0–46.0)
Hemoglobin: 8.5 g/dL — ABNORMAL LOW (ref 12.0–15.0)
MCH: 26.6 pg (ref 26.0–34.0)
MCHC: 28.9 g/dL — ABNORMAL LOW (ref 30.0–36.0)
MCV: 91.9 fL (ref 80.0–100.0)
Platelets: 127 10*3/uL — ABNORMAL LOW (ref 150–400)
RBC: 3.2 MIL/uL — ABNORMAL LOW (ref 3.87–5.11)
RDW: 18.6 % — ABNORMAL HIGH (ref 11.5–15.5)
WBC: 16 10*3/uL — ABNORMAL HIGH (ref 4.0–10.5)
nRBC: 0.1 % (ref 0.0–0.2)

## 2023-11-15 LAB — HEPARIN LEVEL (UNFRACTIONATED): Heparin Unfractionated: 0.85 [IU]/mL — ABNORMAL HIGH (ref 0.30–0.70)

## 2023-11-15 LAB — MAGNESIUM: Magnesium: 1.9 mg/dL (ref 1.7–2.4)

## 2023-11-15 MED ORDER — PANTOPRAZOLE SODIUM 40 MG IV SOLR
40.0000 mg | Freq: Every day | INTRAVENOUS | Status: DC
  Administered 2023-11-15 – 2023-11-17 (×3): 40 mg via INTRAVENOUS
  Filled 2023-11-15 (×3): qty 10

## 2023-11-15 NOTE — Progress Notes (Signed)
 PHARMACY - ANTICOAGULATION CONSULT NOTE  Pharmacy Consult for Heparin  Indication: atrial fibrillation  Allergies  Allergen Reactions   Codeine  Nausea And Vomiting   Tape Other (See Comments) and Rash    Pull skin off.  Paper tape is ok    Patient Measurements: Height: 5\' 4"  (162.6 cm) Weight: 112 kg (246 lb 14.6 oz) (Sizewise bedscale (? pre-weight)) IBW/kg (Calculated) : 54.7 HEPARIN  DW (KG): 89.4  Vital Signs: Temp: 100 F (37.8 C) (04/20 0500) BP: 112/54 (04/20 0500) Pulse Rate: 91 (04/20 0500)  Labs: Recent Labs    11/13/23 0413 11/13/23 1737 11/14/23 0435 11/14/23 1322 11/15/23 0544  HGB 8.9*  --  8.8*  --  8.5*  HCT 29.5*  --  29.0*  --  29.4*  PLT 111*  --  109*  --  127*  APTT 58* 47* 84* 92*  --   HEPARINUNFRC >1.10*  --  >1.10*  --  0.85*  CREATININE 4.73*  --  3.84*  --  4.85*    Estimated Creatinine Clearance: 16.1 mL/min (A) (by C-G formula based on SCr of 4.85 mg/dL (H)).   Medical History: Past Medical History:  Diagnosis Date   Acid reflux    Anemia    Arthritis    Bipolar 1 disorder (HCC)    Blindness of left eye    left eye removed   Carpal tunnel syndrome, bilateral    Cervical radiculopathy    CHF (congestive heart failure) (HCC)    Constipation    COPD (chronic obstructive pulmonary disease) (HCC) 2014   Degenerative disc disease, thoracic    Depression    Diabetes mellitus    Type II   Diabetic retinopathy (HCC)    End stage kidney disease (HCC)    M, W, [facility]   Gout    Hypertension    Non-alcoholic cirrhosis (HCC)    Noncompliance with medication regimen    Obesity (BMI 30-39.9)    OSA (obstructive sleep apnea)    cpap   Panic attack    RLS (restless legs syndrome)     Medications:  Medications Prior to Admission  Medication Sig Dispense Refill Last Dose/Taking   ADVAIR DISKUS 250-50 MCG/ACT AEPB Inhale 1 puff into the lungs 2 (two) times daily.   Past Week   albuterol  (PROVENTIL ) (2.5 MG/3ML) 0.083%  nebulizer solution Take 3 mLs (2.5 mg total) by nebulization every 12 (twelve) hours as needed for wheezing or shortness of breath. 75 mL 0 Past Week   ALPRAZolam  (XANAX ) 1 MG tablet Take 1 tablet (1 mg total) by mouth 4 (four) times daily as needed for anxiety. (Patient taking differently: Take 1 mg by mouth 4 (four) times daily.) 9 tablet 0 11/01/2023   apixaban  (ELIQUIS ) 5 MG TABS tablet Take 2 tablets (10 mg total) by mouth 2 (two) times daily for 7 days, THEN 1 tablet (5 mg total) 2 (two) times daily for 23 days. 74 tablet 0 10/30/2023   Artificial Tear Ointment (DRY EYES OP) Place 1 drop into the right eye 2 (two) times daily.   11/01/2023   atorvastatin  (LIPITOR) 20 MG tablet Take 20 mg by mouth daily.   11/01/2023   atropine  (ISOPTO ATROPINE ) 1 % ophthalmic solution Place 1 drop into the left eye 3 (three) times daily.   11/01/2023   brimonidine  (ALPHAGAN ) 0.2 % ophthalmic solution Place 1 drop into the left eye 3 (three) times daily.   11/01/2023   busPIRone  (BUSPAR ) 7.5 MG tablet Take 1 tablet (  7.5 mg total) by mouth 2 (two) times daily. 7 tablet 0 11/01/2023   cinacalcet  (SENSIPAR ) 60 MG tablet Take 60 mg by mouth daily.   11/01/2023   colchicine  0.6 MG tablet TAKE 1 TABLET(0.6 MG) BY MOUTH DAILY 30 tablet 0 11/01/2023   doxercalciferol  (HECTOROL ) 4 MCG/2ML injection Inject 0.5 mLs (1 mcg total) into the vein every Monday, Wednesday, and Friday with hemodialysis. 2 mL  10/30/2023   gabapentin  (NEURONTIN ) 100 MG capsule Take 1 capsule (100 mg total) by mouth at bedtime. (Patient taking differently: Take 300 mg by mouth at bedtime.) 7 capsule 0 11/01/2023   HYDROcodone -acetaminophen  (NORCO) 10-325 MG tablet Take 1 tablet by mouth 4 (four) times daily.   11/01/2023   insulin  glargine (LANTUS  SOLOSTAR) 100 UNIT/ML Solostar Pen Inject 30 Units into the skin daily.   11/01/2023   latanoprost  (XALATAN ) 0.005 % ophthalmic solution Place 1 drop into the left eye 2 (two) times daily.   11/01/2023   omeprazole  (PRILOSEC) 40 MG  capsule Take 40 mg by mouth daily.   11/01/2023   ondansetron  (ZOFRAN ) 4 MG tablet TAKE (1) TABLET THREE TIMES DAILY AS NEEDED FOR NAUSEA.   11/01/2023   PARoxetine  (PAXIL ) 20 MG tablet Take 1 tablet (20 mg total) by mouth every evening. This is to prevent panic attacks (Patient taking differently: Take 20 mg by mouth at bedtime.) 7 tablet 0 11/01/2023   VELPHORO  500 MG chewable tablet Chew 1,000 mg by mouth 3 (three) times daily.   11/01/2023   Blood Glucose Monitoring Suppl (FORA V30A BLOOD GLUCOSE SYSTEM) DEVI       FORACARE PREMIUM V10 TEST test strip        Assessment: Patient presented with buttock cellulitis. ON chronic anticoagulation with eliquis  for afib. Patient having rhythm changes and MD concerned. Diltiazem  infusion started, cardiology to evaluate. MD asked pharmacy to transition to heparin  for now. Will follow APTT and HL until correlates. Last dose of eliquis  4/16 at 0830  Goal of Therapy:  Heparin  level 0.3-0.7 units/ml aPTT 66-102 seconds Monitor platelets by anticoagulation protocol: Yes  4/18@1737 : aPTT 47, subtherapeutic 4/19@0435 : aPTT 84, therapeutic x 1 (and daily anti-Xa level >1.10, not correlating) 4/19@1322 : aPTT 92, therapeutic x 2 4/19@0544 : aPTT 38, anti-Xa level 0.85 (therapeutic; not correlating)   Plan: Continue heparin  infusion at 2150 units/hour. Check aPTT and heparin  level tomorrow with morning labs Look for correlation between the two, after which will monitor heparin  level Continue to monitor CBC daily while on heparin  infusion.   Will M. Alva Jewels, PharmD Clinical Pharmacist 11/15/2023 9:55 AM

## 2023-11-15 NOTE — Progress Notes (Signed)
 PROGRESS NOTE    Martha [name]  QMV:[id number] DOB: 1965/08/26 DOA: 11/03/2023 PCP: Forest Idol, NP   Brief Narrative:    The patient is a 58 y.o. female with medical history significant of  ESRD on HD (MWF) COPD, hypertension, NASH, AOCD, left eye blindness, type 2 diabetes mellitus with retinopathy, RLS, CHF who presents to the emergency department due to lower abdominal pain, nausea, NBNB vomiting.  She is found to have buttock cellulitis and was initiated on antibiotics and her fever has resolved and leukocytosis is improving.  CT scan of pelvis demonstrated no drainable abscess.  She was undergoing hemodialysis on 4/16 and went into atrial fibrillation with RVR and was transferred to ICU on heparin  drip and amiodarone  which has now been converted to oral dosing.  She is now requiring NG tube for feeding and continues to pull at lines and tubes and requires sitter at bedside.  Likely will require LTAC placement.  Assessment & Plan:   Principal Problem:   Acute febrile illness Active Problems:   GERD   Morbid obesity (HCC)   RESTLESS LEG SYNDROME   OSA on CPAP   Abdominal pain   Nausea & vomiting   Lactic acidosis   Thrombocytopenia (HCC)   Cellulitis of right buttock  Assessment and Plan:   Buttock Cellulitis:  Initially presented with fever, started empirically on vancomycin  and Zosyn . Fever had resolved so antibiotics were discontinued but  Started having drainage from right buttock, general surgery was consulted for possible abscess.  CT of the pelvis showed no drainable abscess but showed cellulitis. Restarted vancomycin  and cefepime . May need to re-image given worsening drainage and WBC. WBC Trend: -Wound care RN consulted.  Lactic acid level was not elevated again but procalcitonin was 6.07.   - Repeat general surgery evaluation due to worsening wound, discussed with Dr. Larrie Po who evaluated wound on 4/17 -Currently on antibiotics as below day 13/14 -NG tube placed by  general surgery 4/19, now with tube feeds ongoing -Patient will require restraints due to pulling at lines and tubes 4/20   New Onset A Fib w/RVR / Elevated Troponin: Checked EKG and showed atrial fibrillation this tracing as last tracing on 8 Place showed normal sinus rhythm.  Changed her Eliquis  to heparin  drip.  Troponin was elevated at 330.  CHA2DS2-VASc score is at least 3-4.  Recent echocardiogram done a few weeks ago and showed normal LVEF.  Initiated on Cardizem  drip but then became extremely hypotensive so this was discontinued and she had to begin fluids and albumin .   - Appreciate cardiology recommendations with IV amiodarone  to oral amiodarone  today and uses metoprolol  every 6 hours for heart rate control with plans to hold for blood pressure drops -Continue heparin  drip for now until closer to discharge   Acute encephalopathy/hypersomnolence:  Likely in setting of opioid overdose. ABG showed pCO2 42 yesterday; CT head showed No evidence of acute intracranial abnormality. Resolved after getting IV Narcan . Will discontinue oxycodone  at this time. Will continue with only Tylenol  as needed for now -Careful use of IV Dilaudid  for pain management   Pubic symphysis inflammation:  CT pelvis shows erosive changes at the pubic symphysis likely from inflammatory process versus osteitis versus infection. 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 Nephrology was consulted for maintenance dialysis in the morning per EDP. Patient has been missing hemodialysis due to pain, nephrology recommending palliative care consult for goals of care. Palliative care consulted, continue dialysis with next session  4/18   Hypertension -> Hypotension: Blood pressure had been elevated but then dropped in the setting of her A-fib with RVR and diltiazem  drip.  Hold antihypertensive and was given albumin  25 g and IV fluid bolus.  To monitor blood pressures per protocol.  If not improving may need pressors.  Continue to monitor blood pressures per protocol.  Last blood pressure reading was 113/58 but did drop as low as 74/22   History of Calciphylaxis: Continue sensipar  as well as Hectorol     Thrombocytopenia:  -Stable, continue to monitor CBC -Likely due to splenic sequestration and infection.  Continue with broad-spectrum antibiotics as above   Splenic Infarct: Hold Eliquis  and change to Heparin  gtt given A Fib w/ RVR and elevated Troponin   OSA: Continue CPAP.     Restless Leg Syndrome: No longer on Gabapentin    GERD/GI Prophylaxis: C/w PPI with Pantoprazole  80 mg po Daily   Depression, Anxiety, Bipolar Disorder: C/w Buspirone  7.5 mg po BID and Paroxetine  20 mg po at bedtime   Normocytic Anemia/Anemia of Chronic Kidney Disease: Hemoglobin/hematocrit is now 9.1/29.4.  Continue with darbepoetin alpha 100 mcg subcu every Wednesday and continue monitor for signs or symptoms of bleeding given that she is now being anticoag with heparin  drip.  Check anemia panel in AM.  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  Started on triple antibiotic ophthalmic ointment twice daily   Hypoalbuminemia: Patient's Albumin  trending down and now 2.1 on last check. CTM and Trend and repeat CMP in the AM -Plan to try tube feeds 4/19   Class III (Morbid) Obesity: Complicates overall prognosis and care -Estimated body mass index is 40.16 kg/m as calculated from the following:   Height as of this encounter: 5\' 8"  (1.727 m).   Weight as  of this encounter: 119.8 kg. Weight Loss and Dietary Counseling given  New concern for UTI -Continue current antibiotics and send off urine culture pending    DVT prophylaxis:Heparin  drip Code Status: Full Family Communication: None at bedside Disposition Plan:  Status is: Inpatient Remains inpatient appropriate because: Need for IV medications.   Consultants:  Cardiology Nephrology General Surgery Palliative care medicine  Procedures:  As above  Antimicrobials:  Anti-infectives (From admission, onward)    Start     Dose/Rate Route Frequency Ordered Stop   11/09/23 1600  ceFEPIme  (MAXIPIME ) 2 g in sodium chloride  0.9 % 100 mL IVPB        2 g 200 mL/hr over 30 Minutes Intravenous Every M-W-F (Hemodialysis) 11/08/23 0854     11/09/23 1600  vancomycin  (VANCOCIN ) IVPB 1000 mg/200 mL premix        1,000 mg 200 mL/hr over 60 Minutes Intravenous Every M-W-F (Hemodialysis) 11/08/23 0855     11/08/23 0930  vancomycin  (VANCOCIN ) IVPB 1000 mg/200 mL premix        1,000 mg 200 mL/hr over 60 Minutes Intravenous  Once 11/08/23 0824 11/08/23 1201   11/08/23 0900  ceFEPIme  (MAXIPIME ) 2 g in sodium chloride  0.9 % 100 mL IVPB  2 g 200 mL/hr over 30 Minutes Intravenous  Once 11/08/23 0804 11/08/23 1008   11/05/23 1130  piperacillin -tazobactam (ZOSYN ) 2.25 g in sodium chloride  0.9 % 50 mL IVPB  Status:  Discontinued        2.25 g 100 mL/hr over 30 Minutes Intravenous Every 8 hours 11/05/23 1054 11/05/23 1127   11/04/23 1200  vancomycin  (VANCOCIN ) IVPB 1000 mg/200 mL premix  Status:  Discontinued        1,000 mg 200 mL/hr over 60 Minutes Intravenous Every M-W-F (Hemodialysis) 11/04/23 0250 11/05/23 1127   11/04/23 0600  piperacillin -tazobactam (ZOSYN ) IVPB 2.25 g  Status:  Discontinued        2.25 g 100 mL/hr over 30 Minutes Intravenous Every 8 hours 11/04/23 0250 11/05/23 1054   11/03/23 2315  vancomycin  (VANCOREADY) IVPB 2000 mg/400 mL        2,000 mg 200 mL/hr over 120 Minutes  Intravenous  Once 11/03/23 2300 11/04/23 0229   11/03/23 2315  piperacillin -tazobactam (ZOSYN ) IVPB 3.375 g        3.375 g 100 mL/hr over 30 Minutes Intravenous  Once 11/03/23 2300 11/04/23 0024      Subjective: Patient seen and evaluated today with no new acute complaints or concerns. No acute concerns or events noted overnight.  She continues to have some agitation and anxiety and has pulled out her Foley catheter as well as IV line today despite sitter at bedside.  Objective: Vitals:   11/15/23 0200 11/15/23 0301 11/15/23 0400 11/15/23 0500  BP: (!) 103/36 (!) 98/47 (!) 91/49 (!) 112/54  Pulse: 89 87 83 91  Resp: 18 18 18 18   Temp: 99.7 F (37.6 C) 99.7 F (37.6 C) 99.7 F (37.6 C) 100 F (37.8 C)  TempSrc:      SpO2: 97% 97% 96% 97%  Weight:      Height:        Intake/Output Summary (Last 24 hours) at 11/15/2023 1055 Last data filed at 11/14/2023 1700 Gross per 24 hour  Intake 265.63 ml  Output --  Net 265.63 ml   Filed Weights   11/11/23 1812 11/13/23 1000 11/13/23 1400  Weight: 114 kg 107 kg 112 kg    Examination:  General exam: Appears calm and comfortable, somnolent but arousable Respiratory system: Clear to auscultation. Respiratory effort normal.  4 L nasal cannula Cardiovascular system: S1 & S2 heard, irregular. Gastrointestinal system: Abdomen is soft, NG tube present Central nervous system: Alert and awake Extremities: No edema Skin: Wound as noted in chart Psychiatry: Flat affect.    Data Reviewed: I have personally reviewed following labs and imaging studies  CBC: Recent Labs  Lab 11/11/23 1320 11/12/23 0551 11/13/23 0413 11/14/23 0435 11/15/23 0544  WBC 22.6* 14.8* 14.2* 13.2* 16.0*  NEUTROABS  --  13.0*  --   --   --   HGB 9.1* 8.7* 8.9* 8.8* 8.5*  HCT 29.4* 29.0* 29.5* 29.0* 29.4*  MCV 90.2 91.2 90.5 91.2 91.9  PLT 110* 107* 111* 109* 127*   Basic Metabolic Panel: Recent Labs  Lab 11/09/23 1835 11/11/23 1320 11/12/23 0551  11/13/23 0413 11/14/23 0435 11/15/23 0544  NA 132* 135 133* 133* 134* 135  K 3.8 3.4* 3.3* 3.6 3.4* 3.7  CL 93* 92* 95* 95* 93* 95*  CO2 25 25 26 27 26 27   GLUCOSE 154* 175* 177* 162* 162* 145*  BUN 54* 43* 27* 35* 27* 39*  CREATININE 7.96* 6.12* 3.73* 4.73* 3.84* 4.85*  CALCIUM  9.4 9.2 8.9 8.9 9.5  9.5  MG  --   --  1.9 1.9 1.9 1.9  PHOS 4.2 2.9 2.9  --   --   --    GFR: Estimated Creatinine Clearance: 16.1 mL/min (A) (by C-G formula based on SCr of 4.85 mg/dL (H)). Liver Function Tests: Recent Labs  Lab 11/09/23 1835 11/11/23 1320 11/12/23 0551 11/13/23 0413 11/14/23 0435  AST  --   --  10* 11* 11*  ALT  --   --  9 8 8   ALKPHOS  --   --  75 75 74  BILITOT  --   --  1.4* 0.8 0.9  PROT  --   --  5.6* 5.6* 5.9*  ALBUMIN  2.1* 1.9* 2.2* 2.1* 2.4*   No results for input(s): "LIPASE", "AMYLASE" in the last 168 hours. No results for input(s): "AMMONIA" in the last 168 hours. Coagulation Profile: No results for input(s): "INR", "PROTIME" in the last 168 hours. Cardiac Enzymes: No results for input(s): "CKTOTAL", "CKMB", "CKMBINDEX", "TROPONINI" in the last 168 hours. BNP (last 3 results) No results for input(s): "PROBNP" in the last 8760 hours. HbA1C: No results for input(s): "HGBA1C" in the last 72 hours. CBG: Recent Labs  Lab 11/14/23 1639 11/14/23 2002 11/15/23 0033 11/15/23 0432 11/15/23 0728  GLUCAP 152* 156* 158* 134* 137*   Lipid Profile: No results for input(s): "CHOL", "HDL", "LDLCALC", "TRIG", "CHOLHDL", "LDLDIRECT" in the last 72 hours. Thyroid  Function Tests: No results for input(s): "TSH", "T4TOTAL", "FREET4", "T3FREE", "THYROIDAB" in the last 72 hours.  Anemia Panel: No results for input(s): "VITAMINB12", "FOLATE", "FERRITIN", "TIBC", "IRON ", "RETICCTPCT" in the last 72 hours. Sepsis Labs: Recent Labs  Lab 11/11/23 1646 11/11/23 1902 11/12/23 0551  PROCALCITON 6.07  --  6.57  LATICACIDVEN 1.0 1.1  --     Recent Results (from the past 240  hours)  MRSA Next Gen by PCR, Nasal     Status: Abnormal   Collection Time: 11/05/23 12:00 PM   Specimen: Nasal Mucosa; Nasal Swab  Result Value Ref Range Status   MRSA by PCR Next Gen DETECTED (A) NOT DETECTED Final    Comment: RESULT CALLED TO, READ BACK BY AND VERIFIED WITH: D HAIRSTON AT 1356 11/05/23 BY A WILSON (NOTE) The GeneXpert MRSA Assay (FDA approved for NASAL specimens only), is one component of a comprehensive MRSA colonization surveillance program. It is not intended to diagnose MRSA infection nor to guide or monitor treatment for MRSA infections. Test performance is not FDA approved in patients less than 56 years old. Performed at [facility], [address], [place], [place] [address]   MRSA Next Gen by PCR, Nasal     Status: Abnormal   Collection Time: 11/11/23  2:06 PM   Specimen: Nasal Mucosa; Nasal Swab  Result Value Ref Range Status   MRSA by PCR Next Gen DETECTED (A) NOT DETECTED Final    Comment: RESULT CALLED TO, READ BACK BY AND VERIFIED WITH: JENNIFER KING @ 1731 ON 11/11/23 C VARNER (NOTE) The GeneXpert MRSA Assay (FDA approved for NASAL specimens only), is one component of a comprehensive MRSA colonization surveillance program. It is not intended to diagnose MRSA infection nor to guide or monitor treatment for MRSA infections. Test performance is not FDA approved in patients less than 105 years old. Performed at [facility], [address], [place], [place] [address]          Radiology Studies: DG CHEST PORT 1 VIEW Result Date: 11/14/2023 CLINICAL DATA:  Encounter for nasogastric tube placement. EXAM:  PORTABLE CHEST 1 VIEW COMPARISON:  11/14/2023. FINDINGS: Study is limited by significant rotational artifact. Enteric tube tip courses by below the level of the GE junction. Unchanged asymmetric elevation of the right hemidiaphragm. Similar appearance of diffuse increase interstitial markings. Decreased aeration to the left lung base is again noted and  may reflect atelectasis versus airspace disease. IMPRESSION: 1. Enteric tube tip courses below the level of the GE junction. 2. Unchanged asymmetric elevation of the right hemidiaphragm. 3. Unchanged left basilar atelectasis versus airspace disease. Electronically Signed   By: Kimberley Penman M.D.   On: 11/14/2023 14:20   DG Chest Port 1 View Result Date: 11/14/2023 CLINICAL DATA:  Nasogastric verification EXAM: PORTABLE CHEST 1 VIEW COMPARISON:  11/03/2023 FINDINGS: Nasogastric tube enters the abdomen with its tip in the expected location of the mid body of the stomach. This should be good to use. Artifact otherwise overlying the region. IMPRESSION: Nasogastric tube tip in the expected location of the mid body of the stomach. Electronically Signed   By: Bettylou Brunner M.D.   On: 11/14/2023 11:01         Scheduled Meds:  amiodarone   200 mg Oral BID   atorvastatin   20 mg Oral Daily   bacitracin -polymyxin b    Left Eye BID   calcitRIOL   2 mcg Oral Q M,W,F   Chlorhexidine  Gluconate Cloth  6 each Topical Q0600   cinacalcet   30 mg Oral Q M,W,F   collagenase    Topical Daily   darbepoetin (ARANESP ) injection - DIALYSIS  100 mcg Subcutaneous Q Wed-1800   insulin  aspart  0-6 Units Subcutaneous Q4H   liver oil-zinc  oxide   Topical BID   metoprolol  tartrate  25 mg Oral Q6H   midodrine   10 mg Oral TID WC   multivitamin  1 tablet Per Tube QHS   mupirocin  ointment   Nasal BID   nystatin    Topical TID   pantoprazole   80 mg Oral Daily   sucroferric oxyhydroxide  1,000 mg Oral TID WC   Continuous Infusions:  ceFEPime  (MAXIPIME ) IV Stopped (11/13/23 1741)   feeding supplement (NEPRO CARB STEADY) 50 mL/hr at 11/15/23 0034   heparin  2,150 Units/hr (11/15/23 1051)   vancomycin  Stopped (11/13/23 1906)     LOS: 12 days    Time spent: 55 minutes    Levie Wages D Mason Sole, DO [facility]  If 7PM-7AM, please contact night-coverage [email or web address] 11/15/2023, 10:55 AM

## 2023-11-15 NOTE — Progress Notes (Signed)
 Foley reestablished Restraints applied correctly. Dressing on buttocks changed and Vash used along with moistened Kerlix.and covered. Destin applied to other areas.

## 2023-11-16 ENCOUNTER — Inpatient Hospital Stay (HOSPITAL_COMMUNITY)

## 2023-11-16 DIAGNOSIS — I4891 Unspecified atrial fibrillation: Secondary | ICD-10-CM | POA: Diagnosis not present

## 2023-11-16 DIAGNOSIS — Z515 Encounter for palliative care: Secondary | ICD-10-CM | POA: Diagnosis not present

## 2023-11-16 DIAGNOSIS — R509 Fever, unspecified: Secondary | ICD-10-CM | POA: Diagnosis not present

## 2023-11-16 DIAGNOSIS — Z7189 Other specified counseling: Secondary | ICD-10-CM | POA: Diagnosis not present

## 2023-11-16 LAB — BASIC METABOLIC PANEL WITH GFR
Anion gap: 15 (ref 5–15)
BUN: 51 mg/dL — ABNORMAL HIGH (ref 6–20)
CO2: 24 mmol/L (ref 22–32)
Calcium: 9.8 mg/dL (ref 8.9–10.3)
Chloride: 96 mmol/L — ABNORMAL LOW (ref 98–111)
Creatinine, Ser: 5.84 mg/dL — ABNORMAL HIGH (ref 0.44–1.00)
GFR, Estimated: 8 mL/min — ABNORMAL LOW (ref >60)
Glucose, Bld: 188 mg/dL — ABNORMAL HIGH (ref 70–99)
Potassium: 3.6 mmol/L (ref 3.5–5.1)
Sodium: 135 mmol/L (ref 135–145)

## 2023-11-16 LAB — GLUCOSE, CAPILLARY
Glucose-Capillary: 138 mg/dL — ABNORMAL HIGH (ref 70–99)
Glucose-Capillary: 161 mg/dL — ABNORMAL HIGH (ref 70–99)
Glucose-Capillary: 161 mg/dL — ABNORMAL HIGH (ref 70–99)
Glucose-Capillary: 162 mg/dL — ABNORMAL HIGH (ref 70–99)
Glucose-Capillary: 163 mg/dL — ABNORMAL HIGH (ref 70–99)
Glucose-Capillary: 168 mg/dL — ABNORMAL HIGH (ref 70–99)

## 2023-11-16 LAB — CBC
HCT: 28.1 % — ABNORMAL LOW (ref 36.0–46.0)
Hemoglobin: 8.5 g/dL — ABNORMAL LOW (ref 12.0–15.0)
MCH: 27.3 pg (ref 26.0–34.0)
MCHC: 30.2 g/dL (ref 30.0–36.0)
MCV: 90.4 fL (ref 80.0–100.0)
Platelets: 152 10*3/uL (ref 150–400)
RBC: 3.11 MIL/uL — ABNORMAL LOW (ref 3.87–5.11)
RDW: 18.8 % — ABNORMAL HIGH (ref 11.5–15.5)
WBC: 15.5 10*3/uL — ABNORMAL HIGH (ref 4.0–10.5)
nRBC: 0.1 % (ref 0.0–0.2)

## 2023-11-16 LAB — VANCOMYCIN, TROUGH: Vancomycin Tr: 24 ug/mL (ref 15–20)

## 2023-11-16 LAB — HEPARIN LEVEL (UNFRACTIONATED): Heparin Unfractionated: 0.55 [IU]/mL (ref 0.30–0.70)

## 2023-11-16 LAB — APTT: aPTT: 75 s — ABNORMAL HIGH (ref 24–36)

## 2023-11-16 LAB — MAGNESIUM: Magnesium: 2.1 mg/dL (ref 1.7–2.4)

## 2023-11-16 MED ORDER — HYDROMORPHONE HCL 1 MG/ML IJ SOLN
0.5000 mg | INTRAMUSCULAR | Status: DC | PRN
  Administered 2023-11-16 – 2023-11-17 (×4): 0.5 mg via INTRAVENOUS
  Filled 2023-11-16 (×4): qty 0.5

## 2023-11-16 MED ORDER — SODIUM THIOSULFATE 250 MG/ML IV SOLN
25.0000 g | INTRAVENOUS | Status: DC
  Administered 2023-11-16: 25 g via INTRAVENOUS
  Filled 2023-11-16: qty 100

## 2023-11-16 NOTE — Progress Notes (Signed)
  HEMODIALYSIS TREATMENT NOTE:  Pt is restrained and wailing.  Verbally non-responsive.  Not speaking, just yowling, sobbing, and fighting soft wrist restraints for the entirety of 3.5 hour HD session.  Inconsolable.   Unable to tolerate ultrafiltration.  Two episodes of hypotension with SBP<100.  With administration of sodium thiosulfate , Vancomycin , and Cefepime ,  net UF +100.  Post-HD:  11/16/23 2313  Vital Signs  Temp 99 F (37.2 C)  Temp Source Core  Pulse Rate 97  Pulse Rate Source Monitor  Resp (!) 22  BP (!) 120/57  BP Location Right Arm  BP Method Automatic  Patient Position (if appropriate) Lying  Post Treatment  Dialyzer Clearance Lightly streaked  Hemodialysis Intake (mL) 800 mL  Liters Processed 55.6  Fluid Removed (mL) 800 mL  Tolerated HD Treatment No (Comment)  Post-Hemodialysis Comments See PN  AVG/AVF Arterial Site Held (minutes) 10 minutes  AVG/AVF Venous Site Held (minutes) 10 minutes  Fistula / Graft Left Upper arm Arteriovenous fistula  Placement Date/Time: 04/26/19 1056   Orientation: Left  Access Location: Upper arm  Access Type: (c) Arteriovenous fistula  Fistula / Graft Assessment Thrill;Bruit  Status Patent    Waunita Haff, RN AP [phone]

## 2023-11-16 NOTE — Progress Notes (Signed)
 PROGRESS NOTE    Martha [name]  NUU:[id number] DOB: 04-Jun-1966 DOA: 11/03/2023 PCP: Forest Idol, NP   Brief Narrative:    The patient is a 58 y.o. female with medical history significant of  ESRD on HD (MWF) COPD, hypertension, NASH, AOCD, left eye blindness, type 2 diabetes mellitus with retinopathy, RLS, CHF who presents to the emergency department due to lower abdominal pain, nausea, NBNB vomiting.  She is found to have buttock cellulitis and was initiated on antibiotics and her fever has resolved and leukocytosis is improving.  CT scan of pelvis demonstrated no drainable abscess.  She was undergoing hemodialysis on 4/16 and went into atrial fibrillation with RVR and was transferred to ICU on heparin  drip and amiodarone  which has now been converted to oral dosing.  She is now requiring NG tube for feeding and continues to pull at lines and tubes and requires sitter at bedside.  Need for further discussion with palliative care.  Assessment & Plan:   Principal Problem:   Acute febrile illness Active Problems:   GERD   Morbid obesity (HCC)   RESTLESS LEG SYNDROME   OSA on CPAP   Abdominal pain   Nausea & vomiting   Lactic acidosis   Thrombocytopenia (HCC)   Cellulitis of right buttock  Assessment and Plan:   Buttock Cellulitis:  Initially presented with fever, started empirically on vancomycin  and Zosyn . Fever had resolved so antibiotics were discontinued but  Started having drainage from right buttock, general surgery was consulted for possible abscess.  CT of the pelvis showed no drainable abscess but showed cellulitis. Restarted vancomycin  and cefepime . May need to re-image given worsening drainage and WBC. WBC Trend: -Wound care RN consulted.  Lactic acid level was not elevated again but procalcitonin was 6.07.   - Repeat general surgery evaluation due to worsening wound, discussed with Dr. Larrie Po who evaluated wound on 4/17 -Currently on antibiotics as below day 14/14 -NG tube  placed by general surgery 4/19, now with tube feeds ongoing -Patient will require restraints due to pulling at lines and tubes 4/20   New Onset A Fib w/RVR / Elevated Troponin: Checked EKG and showed atrial fibrillation this tracing as last tracing on 8 Place showed normal sinus rhythm.  Changed her Eliquis  to heparin  drip.  Troponin was elevated at 330.  CHA2DS2-VASc score is at least 3-4.  Recent echocardiogram done a few weeks ago and showed normal LVEF.  Initiated on Cardizem  drip but then became extremely hypotensive so this was discontinued and she had to begin fluids and albumin .   - Appreciate cardiology recommendations with IV amiodarone  to oral amiodarone  today and uses metoprolol  every 6 hours for heart rate control with plans to hold for blood pressure drops -Continue heparin  drip for now until closer to discharge   Acute encephalopathy/hypersomnolence:  Likely in setting of opioid overdose. ABG showed pCO2 42 yesterday; CT head showed No evidence of acute intracranial abnormality. Resolved after getting IV Narcan . Will discontinue oxycodone  at this time. Will continue with only Tylenol  as needed for now -Careful use of IV Dilaudid  for pain management -Check ammonia level   Pubic symphysis inflammation:  CT pelvis shows erosive changes at the pubic symphysis likely from inflammatory process versus osteitis versus infection. 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 Nephrology was consulted for maintenance dialysis in the morning per EDP. Patient has been missing hemodialysis due to pain, nephrology recommending palliative care consult for goals of care. Palliative care consulted,  continue dialysis with next session 4/18   Hypertension -> Hypotension: Blood pressure had been elevated but then dropped in the setting of her A-fib with RVR and diltiazem  drip.  Hold antihypertensive and was given albumin  25 g and IV fluid bolus.  To monitor blood pressures per protocol.  If not improving may need pressors.  Continue to monitor blood pressures per protocol.  Last blood pressure reading was 113/58 but did drop as low as 74/22   History of Calciphylaxis: Continue sensipar  as well as Hectorol     Thrombocytopenia:  -Stable, continue to monitor CBC -Likely due to splenic sequestration and infection.  Continue with broad-spectrum antibiotics as above   Splenic Infarct: Hold Eliquis  and change to Heparin  gtt given A Fib w/ RVR and elevated Troponin   OSA: Continue CPAP.     Restless Leg Syndrome: No longer on Gabapentin    GERD/GI Prophylaxis: C/w PPI with Pantoprazole  80 mg po Daily   Depression, Anxiety, Bipolar Disorder: C/w Buspirone  7.5 mg po BID and Paroxetine  20 mg po at bedtime   Normocytic Anemia/Anemia of Chronic Kidney Disease: Hemoglobin/hematocrit is now 9.1/29.4.  Continue with darbepoetin alpha 100 mcg subcu every Wednesday and continue monitor for signs or symptoms of bleeding given that she is now being anticoag with heparin  drip.  Check anemia panel in AM.  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  Started on triple antibiotic ophthalmic ointment twice daily   Hypoalbuminemia: Patient's Albumin  trending down and now 2.1 on last check. CTM and Trend and repeat CMP in the AM -Plan to try tube feeds 4/19   Class III (Morbid) Obesity: Complicates overall prognosis and care -Estimated body mass index is 40.16 kg/m as calculated from the following:   Height as of this  encounter: 5\' 8"  (1.727 m).   Weight as of this encounter: 119.8 kg. Weight Loss and Dietary Counseling given  New concern for UTI -Continue current antibiotics and send off urine culture pending  Concern for calciphylaxis/thyroid  mass -Appreciate nephrology recommendations -Check ultrasound of thyroid     DVT prophylaxis:Heparin  drip Code Status: Full Family Communication: None at bedside Disposition Plan:  Status is: Inpatient Remains inpatient appropriate because: Need for IV medications.   Consultants:  Cardiology Nephrology General Surgery Palliative care medicine  Procedures:  As above  Antimicrobials:  Anti-infectives (From admission, onward)    Start     Dose/Rate Route Frequency Ordered Stop   11/09/23 1600  ceFEPIme  (MAXIPIME ) 2 g in sodium chloride  0.9 % 100 mL IVPB        2 g 200 mL/hr over 30 Minutes Intravenous Every M-W-F (Hemodialysis) 11/08/23 0854     11/09/23 1600  vancomycin  (VANCOCIN ) IVPB 1000 mg/200 mL premix        1,000 mg 200 mL/hr over 60 Minutes Intravenous Every M-W-F (Hemodialysis) 11/08/23 0855     11/08/23 0930  vancomycin  (VANCOCIN ) IVPB 1000 mg/200 mL premix        1,000 mg 200 mL/hr over 60 Minutes Intravenous  Once 11/08/23 0824 11/08/23 1201   11/08/23  0900  ceFEPIme  (MAXIPIME ) 2 g in sodium chloride  0.9 % 100 mL IVPB        2 g 200 mL/hr over 30 Minutes Intravenous  Once 11/08/23 0804 11/08/23 1008   11/05/23 1130  piperacillin -tazobactam (ZOSYN ) 2.25 g in sodium chloride  0.9 % 50 mL IVPB  Status:  Discontinued        2.25 g 100 mL/hr over 30 Minutes Intravenous Every 8 hours 11/05/23 1054 11/05/23 1127   11/04/23 1200  vancomycin  (VANCOCIN ) IVPB 1000 mg/200 mL premix  Status:  Discontinued        1,000 mg 200 mL/hr over 60 Minutes Intravenous Every M-W-F (Hemodialysis) 11/04/23 0250 11/05/23 1127   11/04/23 0600  piperacillin -tazobactam (ZOSYN ) IVPB 2.25 g  Status:  Discontinued        2.25 g 100 mL/hr over 30 Minutes  Intravenous Every 8 hours 11/04/23 0250 11/05/23 1054   11/03/23 2315  vancomycin  (VANCOREADY) IVPB 2000 mg/400 mL        2,000 mg 200 mL/hr over 120 Minutes Intravenous  Once 11/03/23 2300 11/04/23 0229   11/03/23 2315  piperacillin -tazobactam (ZOSYN ) IVPB 3.375 g        3.375 g 100 mL/hr over 30 Minutes Intravenous  Once 11/03/23 2300 11/04/23 0024      Subjective: Patient seen and evaluated today with no new acute complaints or concerns. No acute concerns or events noted overnight.  She continues to remain on tube feeds and requires restraints.  Objective: Vitals:   11/16/23 0700 11/16/23 0800 11/16/23 0900 11/16/23 1000  BP: (!) 145/63 (!) 155/66 107/65 (!) 112/53  Pulse:   96 77  Resp: 20 20 20 20   Temp: (!) 100.8 F (38.2 C) (!) 100.6 F (38.1 C) (!) 100.6 F (38.1 C) 100.2 F (37.9 C)  TempSrc:      SpO2:  96% 94% 94%  Weight:      Height:        Intake/Output Summary (Last 24 hours) at 11/16/2023 1106 Last data filed at 11/16/2023 0900 Gross per 24 hour  Intake 2049.2 ml  Output --  Net 2049.2 ml   Filed Weights   11/13/23 1000 11/13/23 1400 11/16/23 0500  Weight: 107 kg 112 kg 108 kg    Examination:  General exam: Appears calm and comfortable, somnolent but arousable Respiratory system: Clear to auscultation. Respiratory effort normal.  4 L nasal cannula Cardiovascular system: S1 & S2 heard, irregular. Gastrointestinal system: Abdomen is soft, NG tube present Central nervous system: Alert and awake Extremities: No edema Skin: Wound as noted in chart Psychiatry: Flat affect.    Data Reviewed: I have personally reviewed following labs and imaging studies  CBC: Recent Labs  Lab 11/12/23 0551 11/13/23 0413 11/14/23 0435 11/15/23 0544 11/16/23 0506  WBC 14.8* 14.2* 13.2* 16.0* 15.5*  NEUTROABS 13.0*  --   --   --   --   HGB 8.7* 8.9* 8.8* 8.5* 8.5*  HCT 29.0* 29.5* 29.0* 29.4* 28.1*  MCV 91.2 90.5 91.2 91.9 90.4  PLT 107* 111* 109* 127* 152    Basic Metabolic Panel: Recent Labs  Lab 11/09/23 1835 11/11/23 1320 11/12/23 0551 11/13/23 0413 11/14/23 0435 11/15/23 0544 11/16/23 0506  NA 132* 135 133* 133* 134* 135 135  K 3.8 3.4* 3.3* 3.6 3.4* 3.7 3.6  CL 93* 92* 95* 95* 93* 95* 96*  CO2 25 25 26 27 26 27 24   GLUCOSE 154* 175* 177* 162* 162* 145* 188*  BUN 54* 43* 27* 35* 27* 39*  51*  CREATININE 7.96* 6.12* 3.73* 4.73* 3.84* 4.85* 5.84*  CALCIUM  9.4 9.2 8.9 8.9 9.5 9.5 9.8  MG  --   --  1.9 1.9 1.9 1.9 2.1  PHOS 4.2 2.9 2.9  --   --   --   --    GFR: Estimated Creatinine Clearance: 13.1 mL/min (A) (by C-G formula based on SCr of 5.84 mg/dL (H)). Liver Function Tests: Recent Labs  Lab 11/09/23 1835 11/11/23 1320 11/12/23 0551 11/13/23 0413 11/14/23 0435  AST  --   --  10* 11* 11*  ALT  --   --  9 8 8   ALKPHOS  --   --  75 75 74  BILITOT  --   --  1.4* 0.8 0.9  PROT  --   --  5.6* 5.6* 5.9*  ALBUMIN  2.1* 1.9* 2.2* 2.1* 2.4*   No results for input(s): "LIPASE", "AMYLASE" in the last 168 hours. No results for input(s): "AMMONIA" in the last 168 hours. Coagulation Profile: No results for input(s): "INR", "PROTIME" in the last 168 hours. Cardiac Enzymes: No results for input(s): "CKTOTAL", "CKMB", "CKMBINDEX", "TROPONINI" in the last 168 hours. BNP (last 3 results) No results for input(s): "PROBNP" in the last 8760 hours. HbA1C: No results for input(s): "HGBA1C" in the last 72 hours. CBG: Recent Labs  Lab 11/15/23 1625 11/15/23 2020 11/16/23 0027 11/16/23 0413 11/16/23 0730  GLUCAP 162* 144* 161* 161* 163*   Lipid Profile: No results for input(s): "CHOL", "HDL", "LDLCALC", "TRIG", "CHOLHDL", "LDLDIRECT" in the last 72 hours. Thyroid  Function Tests: No results for input(s): "TSH", "T4TOTAL", "FREET4", "T3FREE", "THYROIDAB" in the last 72 hours.  Anemia Panel: No results for input(s): "VITAMINB12", "FOLATE", "FERRITIN", "TIBC", "IRON ", "RETICCTPCT" in the last 72 hours. Sepsis Labs: Recent Labs   Lab 11/11/23 1646 11/11/23 1902 11/12/23 0551  PROCALCITON 6.07  --  6.57  LATICACIDVEN 1.0 1.1  --     Recent Results (from the past 240 hours)  MRSA Next Gen by PCR, Nasal     Status: Abnormal   Collection Time: 11/11/23  2:06 PM   Specimen: Nasal Mucosa; Nasal Swab  Result Value Ref Range Status   MRSA by PCR Next Gen DETECTED (A) NOT DETECTED Final    Comment: RESULT CALLED TO, READ BACK BY AND VERIFIED WITH: JENNIFER KING @ 1731 ON 11/11/23 C VARNER (NOTE) The GeneXpert MRSA Assay (FDA approved for NASAL specimens only), is one component of a comprehensive MRSA colonization surveillance program. It is not intended to diagnose MRSA infection nor to guide or monitor treatment for MRSA infections. Test performance is not FDA approved in patients less than 75 years old. Performed at [facility], [address] [place], [place] [address]          Radiology Studies: DG CHEST PORT 1 VIEW Result Date: 11/14/2023 CLINICAL DATA:  Encounter for nasogastric tube placement. EXAM: PORTABLE CHEST 1 VIEW COMPARISON:  11/14/2023. FINDINGS: Study is limited by significant rotational artifact. Enteric tube tip courses by below the level of the GE junction. Unchanged asymmetric elevation of the right hemidiaphragm. Similar appearance of diffuse increase interstitial markings. Decreased aeration to the left lung base is again noted and may reflect atelectasis versus airspace disease. IMPRESSION: 1. Enteric tube tip courses below the level of the GE junction. 2. Unchanged asymmetric elevation of the right hemidiaphragm. 3. Unchanged left basilar atelectasis versus airspace disease. Electronically Signed   By: Kimberley Penman M.D.   On: 11/14/2023 14:20  Scheduled Meds:  amiodarone   200 mg Oral BID   atorvastatin   20 mg Oral Daily   bacitracin -polymyxin b    Left Eye BID   calcitRIOL   2 mcg Oral Q M,W,F   Chlorhexidine  Gluconate Cloth  6 each Topical Q0600   cinacalcet   30 mg Oral  Q M,W,F   collagenase    Topical Daily   darbepoetin (ARANESP ) injection - DIALYSIS  100 mcg Subcutaneous Q Wed-1800   insulin  aspart  0-6 Units Subcutaneous Q4H   liver oil-zinc  oxide   Topical BID   metoprolol  tartrate  25 mg Oral Q6H   midodrine   10 mg Oral TID WC   multivitamin  1 tablet Per Tube QHS   mupirocin  ointment   Nasal BID   nystatin    Topical TID   pantoprazole  (PROTONIX ) IV  40 mg Intravenous Daily   sucroferric oxyhydroxide  1,000 mg Oral TID WC   Continuous Infusions:  ceFEPime  (MAXIPIME ) IV Stopped (11/13/23 1741)   feeding supplement (NEPRO CARB STEADY) 1,000 mL (11/15/23 1850)   heparin  2,150 Units/hr (11/15/23 2238)   sodium thiosulfate  25 g in sodium chloride  0.9 % 200 mL Infusion for Calciphylaxis     vancomycin  Stopped (11/13/23 1906)     LOS: 13 days    Time spent: 55 minutes    Kestrel Mis D Mason Sole, DO [facility]  If 7PM-7AM, please contact night-coverage [email or web address] 11/16/2023, 11:06 AM

## 2023-11-16 NOTE — Plan of Care (Signed)
  Problem: Education: Goal: Knowledge of General Education information will improve Description: Including pain rating scale, medication(s)/side effects and non-pharmacologic comfort measures Outcome: Not Progressing   Problem: Health Behavior/Discharge Planning: Goal: Ability to manage health-related needs will improve Outcome: Not Progressing   Problem: Activity: Goal: Risk for activity intolerance will decrease Outcome: Not Progressing   Problem: Coping: Goal: Level of anxiety will decrease Outcome: Not Progressing   

## 2023-11-16 NOTE — Progress Notes (Signed)
 PHARMACY - ANTICOAGULATION CONSULT NOTE  Pharmacy Consult for Heparin  Indication: atrial fibrillation  Allergies  Allergen Reactions   Codeine  Nausea And Vomiting   Tape Other (See Comments) and Rash    Pull skin off.  Paper tape is ok    Patient Measurements: Height: 5\' 4"  (162.6 cm) Weight: 108 kg (238 lb 1.6 oz) IBW/kg (Calculated) : 54.7 HEPARIN  DW (KG): 89.4  Vital Signs: Temp: 100.6 F (38.1 C) (04/21 0600) BP: 127/59 (04/21 0600) Pulse Rate: 87 (04/21 0457)  Labs: Recent Labs    11/14/23 0435 11/14/23 1322 11/15/23 0544 11/16/23 0506  HGB 8.8*  --  8.5* 8.5*  HCT 29.0*  --  29.4* 28.1*  PLT 109*  --  127* 152  APTT 84* 92* 38* 75*  HEPARINUNFRC >1.10*  --  0.85* 0.55  CREATININE 3.84*  --  4.85* 5.84*    Estimated Creatinine Clearance: 13.1 mL/min (A) (by C-G formula based on SCr of 5.84 mg/dL (H)).   Medical History: Past Medical History:  Diagnosis Date   Acid reflux    Anemia    Arthritis    Bipolar 1 disorder (HCC)    Blindness of left eye    left eye removed   Carpal tunnel syndrome, bilateral    Cervical radiculopathy    CHF (congestive heart failure) (HCC)    Constipation    COPD (chronic obstructive pulmonary disease) (HCC) 2014   Degenerative disc disease, thoracic    Depression    Diabetes mellitus    Type II   Diabetic retinopathy (HCC)    End stage kidney disease (HCC)    M, W, F [facility]   Gout    Hypertension    Non-alcoholic cirrhosis (HCC)    Noncompliance with medication regimen    Obesity (BMI 30-39.9)    OSA (obstructive sleep apnea)    cpap   Panic attack    RLS (restless legs syndrome)     Medications:  Medications Prior to Admission  Medication Sig Dispense Refill Last Dose/Taking   ADVAIR DISKUS 250-50 MCG/ACT AEPB Inhale 1 puff into the lungs 2 (two) times daily.   Past Week   albuterol  (PROVENTIL ) (2.5 MG/3ML) 0.083% nebulizer solution Take 3 mLs (2.5 mg total) by nebulization every 12 (twelve)  hours as needed for wheezing or shortness of breath. 75 mL 0 Past Week   ALPRAZolam  (XANAX ) 1 MG tablet Take 1 tablet (1 mg total) by mouth 4 (four) times daily as needed for anxiety. (Patient taking differently: Take 1 mg by mouth 4 (four) times daily.) 9 tablet 0 11/01/2023   apixaban  (ELIQUIS ) 5 MG TABS tablet Take 2 tablets (10 mg total) by mouth 2 (two) times daily for 7 days, THEN 1 tablet (5 mg total) 2 (two) times daily for 23 days. 74 tablet 0 10/30/2023   Artificial Tear Ointment (DRY EYES OP) Place 1 drop into the right eye 2 (two) times daily.   11/01/2023   atorvastatin  (LIPITOR) 20 MG tablet Take 20 mg by mouth daily.   11/01/2023   atropine  (ISOPTO ATROPINE ) 1 % ophthalmic solution Place 1 drop into the left eye 3 (three) times daily.   11/01/2023   brimonidine  (ALPHAGAN ) 0.2 % ophthalmic solution Place 1 drop into the left eye 3 (three) times daily.   11/01/2023   busPIRone  (BUSPAR ) 7.5 MG tablet Take 1 tablet (7.5 mg total) by mouth 2 (two) times daily. 7 tablet 0 11/01/2023   cinacalcet  (SENSIPAR ) 60 MG tablet Take 60 mg by  mouth daily.   11/01/2023   colchicine  0.6 MG tablet TAKE 1 TABLET(0.6 MG) BY MOUTH DAILY 30 tablet 0 11/01/2023   doxercalciferol  (HECTOROL ) 4 MCG/2ML injection Inject 0.5 mLs (1 mcg total) into the vein every Monday, Wednesday, and Friday with hemodialysis. 2 mL  10/30/2023   gabapentin  (NEURONTIN ) 100 MG capsule Take 1 capsule (100 mg total) by mouth at bedtime. (Patient taking differently: Take 300 mg by mouth at bedtime.) 7 capsule 0 11/01/2023   HYDROcodone -acetaminophen  (NORCO) 10-325 MG tablet Take 1 tablet by mouth 4 (four) times daily.   11/01/2023   insulin  glargine (LANTUS  SOLOSTAR) 100 UNIT/ML Solostar Pen Inject 30 Units into the skin daily.   11/01/2023   latanoprost  (XALATAN ) 0.005 % ophthalmic solution Place 1 drop into the left eye 2 (two) times daily.   11/01/2023   omeprazole  (PRILOSEC) 40 MG capsule Take 40 mg by mouth daily.   11/01/2023   ondansetron  (ZOFRAN ) 4 MG  tablet TAKE (1) TABLET THREE TIMES DAILY AS NEEDED FOR NAUSEA.   11/01/2023   PARoxetine  (PAXIL ) 20 MG tablet Take 1 tablet (20 mg total) by mouth every evening. This is to prevent panic attacks (Patient taking differently: Take 20 mg by mouth at bedtime.) 7 tablet 0 11/01/2023   VELPHORO  500 MG chewable tablet Chew 1,000 mg by mouth 3 (three) times daily.   11/01/2023   Blood Glucose Monitoring Suppl (FORA V30A BLOOD GLUCOSE SYSTEM) DEVI       FORACARE PREMIUM V10 TEST test strip        Assessment: Patient presented with buttock cellulitis. ON chronic anticoagulation with eliquis  for afib. Patient having rhythm changes and MD concerned. Diltiazem  infusion started, cardiology to evaluate. MD asked pharmacy to transition to heparin  for now. Will follow APTT and HL until correlates. Last dose of eliquis  4/16 at 0830  aPTT 75- therapeutic HL 0.55- therapeutic Hgb 8.5- stable  Goal of Therapy:  Heparin  level 0.3-0.7 units/ml aPTT 66-102 seconds Monitor platelets by anticoagulation protocol: Yes    Plan: Continue heparin  infusion at 2150 units/hour. Check heparin  levels daily now that correlation with aPTT. Continue to monitor CBC daily while on heparin  infusion.   Cliffton Dama, PharmD Clinical Pharmacist 11/16/2023 8:37 AM

## 2023-11-16 NOTE — Progress Notes (Signed)
 Palliative: Face-to-face discussion with bedside nursing staff and transition care teams related to patient condition, needs, goals of care.  Mrs. Martha [name], is lying quietly in bed.  She appears acutely/chronically ill and very frail, obese.  She is blind, and does not attempt to make eye contact.  She will move her head, but not respond to my questions.  She clearly cannot make her basic needs known.  There is no family at bedside at this time.  Call to husband of 5 years, Martha [name], they have been together on and off for 30 years. He tells me that he is living in a group home. Martha [name]'s son Martha [name] lives in [place] . 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   He told me that she told him on Thursday, "I am going to die here". I shared our concern that this may be true.   We talked about her acute health concerns and the treatment plan.  We talked about her infection, calciphylaxis, plan for HD today, tube feeding.  I shared that she is not awake enough to eat and drink to support life.  We talked about CODE STATUS. He tearfully stated, "she is all I've got". Initially, he stated that he did not think she would want life support. He later stated that he needed to speak with [facility]. I reminded him that he is the decision-maker. PMT to follow-up.   Conference with attending, bedside nursing staff, transition of care team related to patient condition, needs, goals of care, disposition.  Plan: At this point continue full scope/full code.  Time for decision making about CODE STATUS.  PMT to follow.  50 minutes  Eiliana Drone, NP Palliative medicine team Team phone [phone]

## 2023-11-16 NOTE — Progress Notes (Addendum)
 [facility] Progress Note   Assessment/ Plan:     # ESRD: - HD per MWF schedule.  Next HD today   # Volume/ hypertension: Hypertensive and volume overloaded initially--improved but then hypotensive during afib w/ rvr, BP now stable, UF as tolerated   # Anemia of Chronic Kidney Disease: - No IV iron  for now given infection.  - Aranesp  100mcg every wed (increased last week)   # Secondary Hyperparathyroidism/Hyperphosphatemia: Ca and phos acceptable. On sensipar  and calcitriol  Note history of calciphylaxis.  Pics have a lot of slough on them and hard to tell if this fits with calciphylaxis- would have a high suspicion to resume Na thio---Checking PTH, will start na thiosulfate w/ HD   #Buttock cellulitis: On abx per primary team. Vanc+cefepime  w/ HD, surgery following  # New onset Afib w/ RVR: cardiology following. On amio PO and heparin  gtt  #Thyroid  mass: hypodense, on 4/8 CT addendum, increased in size. W/u per primary  # Acute encephalopathy/hypersomnolence: per primary. Of note, has been combative on HD therefore safety is a concern   # Uncontrolled type 2 diabetes with hyperglycemia: mgmt per primary team    #GOC:  note patient missing dialysis routinely as an outpatient- often because she is in pain and generally doesn't feel well. Palliative care has seen, last note: full scope of care  Outpatient dialysis prescription:  [facility] MWF EDW 120.5kg  4hrs AVF BFR 400/DFR 500 Heparin  1000 bolus and 800/hr Venofer 50 weekly Mircera 200 q2wks Sensipar  30 qtx, calcitriol  2mcg qtx   Disposition per primary team  Discussed w/ ICU RN and primary.  Subjective:    Seen in room/ICU. Patient currently groaning, laying flat in bed, restraints in place, temps climbing up-about to receive tylenol . Just received dilaudid  and ativan    Objective:   BP (!) 127/59   Pulse 87   Temp (!) 100.6 F (38.1 C)   Resp (!) 21   Ht 5\' 4"  (1.626 m)   Wt 108 kg   LMP  08/03/2008 (Exact Date)   SpO2 100%   BMI 40.87 kg/m   Physical Exam: Gen: ill appearing, groaning in bed HEENT: +NGT CVS: irreg irreg, on amio gtt Resp: cta b/l Abd: soft, obese Neuro: not following commands Ext: trace anasarca Dialysis access: LUE AVF +b/t  Labs: BMET Recent Labs  Lab 11/09/23 1835 11/11/23 1320 11/12/23 0551 11/13/23 0413 11/14/23 0435 11/15/23 0544 11/16/23 0506  NA 132* 135 133* 133* 134* 135 135  K 3.8 3.4* 3.3* 3.6 3.4* 3.7 3.6  CL 93* 92* 95* 95* 93* 95* 96*  CO2 25 25 26 27 26 27 24   GLUCOSE 154* 175* 177* 162* 162* 145* 188*  BUN 54* 43* 27* 35* 27* 39* 51*  CREATININE 7.96* 6.12* 3.73* 4.73* 3.84* 4.85* 5.84*  CALCIUM  9.4 9.2 8.9 8.9 9.5 9.5 9.8  PHOS 4.2 2.9 2.9  --   --   --   --    CBC Recent Labs  Lab 11/12/23 0551 11/13/23 0413 11/14/23 0435 11/15/23 0544 11/16/23 0506  WBC 14.8* 14.2* 13.2* 16.0* 15.5*  NEUTROABS 13.0*  --   --   --   --   HGB 8.7* 8.9* 8.8* 8.5* 8.5*  HCT 29.0* 29.5* 29.0* 29.4* 28.1*  MCV 91.2 90.5 91.2 91.9 90.4  PLT 107* 111* 109* 127* 152      Medications:     amiodarone   200 mg Oral BID   atorvastatin   20 mg Oral Daily   bacitracin -polymyxin b   Left Eye BID   calcitRIOL   2 mcg Oral Q M,W,F   Chlorhexidine  Gluconate Cloth  6 each Topical Q0600   cinacalcet   30 mg Oral Q M,W,F   collagenase    Topical Daily   darbepoetin (ARANESP ) injection - DIALYSIS  100 mcg Subcutaneous Q Wed-1800   insulin  aspart  0-6 Units Subcutaneous Q4H   liver oil-zinc  oxide   Topical BID   metoprolol  tartrate  25 mg Oral Q6H   midodrine   10 mg Oral TID WC   multivitamin  1 tablet Per Tube QHS   mupirocin  ointment   Nasal BID   nystatin    Topical TID   pantoprazole  (PROTONIX ) IV  40 mg Intravenous Daily   sucroferric oxyhydroxide  1,000 mg Oral TID WC     Leandra Pro MD 11/16/2023, 7:56 AM

## 2023-11-16 NOTE — Progress Notes (Addendum)
 Rounding Note    Patient Name: Martha [name] Date of Encounter: 11/16/2023  [facility] Cardiologist: New  Subjective   No complaints  Inpatient Medications    Scheduled Meds:  amiodarone   200 mg Oral BID   atorvastatin   20 mg Oral Daily   bacitracin -polymyxin b    Left Eye BID   calcitRIOL   2 mcg Oral Q M,W,F   Chlorhexidine  Gluconate Cloth  6 each Topical Q0600   cinacalcet   30 mg Oral Q M,W,F   collagenase    Topical Daily   darbepoetin (ARANESP ) injection - DIALYSIS  100 mcg Subcutaneous Q Wed-1800   insulin  aspart  0-6 Units Subcutaneous Q4H   liver oil-zinc  oxide   Topical BID   metoprolol  tartrate  25 mg Oral Q6H   midodrine   10 mg Oral TID WC   multivitamin  1 tablet Per Tube QHS   mupirocin  ointment   Nasal BID   nystatin    Topical TID   pantoprazole  (PROTONIX ) IV  40 mg Intravenous Daily   sucroferric oxyhydroxide  1,000 mg Oral TID WC   Continuous Infusions:  ceFEPime  (MAXIPIME ) IV Stopped (11/13/23 1741)   feeding supplement (NEPRO CARB STEADY) 1,000 mL (11/15/23 1850)   heparin  2,150 Units/hr (11/15/23 2238)   sodium thiosulfate  25 g in sodium chloride  0.9 % 200 mL Infusion for Calciphylaxis     vancomycin  Stopped (11/13/23 1906)   PRN Meds: acetaminophen  **OR** acetaminophen , ALPRAZolam , guaiFENesin -dextromethorphan , HYDROmorphone  (DILAUDID ) injection, loperamide , LORazepam , naLOXone  (NARCAN )  injection, ondansetron  **OR** ondansetron  (ZOFRAN ) IV   Vital Signs    Vitals:   11/16/23 0400 11/16/23 0457 11/16/23 0500 11/16/23 0600  BP: 119/76 119/76 (!) 156/51 (!) 127/59  Pulse:  87    Resp: (!) 24  (!) 21 (!) 21  Temp: 99.9 F (37.7 C)  100.2 F (37.9 C) (!) 100.6 F (38.1 C)  TempSrc:      SpO2:      Weight:   108 kg   Height:        Intake/Output Summary (Last 24 hours) at 11/16/2023 0838 Last data filed at 11/15/2023 1850 Gross per 24 hour  Intake 1884.2 ml  Output --  Net 1884.2 ml      11/16/2023    5:00 AM 11/13/2023     2:00 PM 11/13/2023   10:00 AM  Last 3 Weights  Weight (lbs) 238 lb 1.6 oz 246 lb 14.6 oz 235 lb 14.3 oz  Weight (kg) 108 kg 112 kg 107 kg      Telemetry    Rate controlled afib - Personally Reviewed  ECG    N/a - Personally Reviewed  Physical Exam   GEN: No acute distress.   Neck: No JVD Cardiac: irreg Respiratory: Clear to auscultation bilaterally. GI: Soft, nontender, non-distended  MS: No edema; No deformity. Neuro:  Nonfocal  Psych: Normal affect   Labs    High Sensitivity Troponin:   Recent Labs  Lab 10/20/23 2132 11/03/23 1714 11/03/23 1921 11/11/23 1441 11/11/23 1646  TROPONINIHS 12 12 14  330* 322*     Chemistry Recent Labs  Lab 11/12/23 0551 11/13/23 0413 11/14/23 0435 11/15/23 0544 11/16/23 0506  NA 133* 133* 134* 135 135  K 3.3* 3.6 3.4* 3.7 3.6  CL 95* 95* 93* 95* 96*  CO2 26 27 26 27 24   GLUCOSE 177* 162* 162* 145* 188*  BUN 27* 35* 27* 39* 51*  CREATININE 3.73* 4.73* 3.84* 4.85* 5.84*  CALCIUM  8.9 8.9 9.5 9.5 9.8  MG 1.9  1.9 1.9 1.9 2.1  PROT 5.6* 5.6* 5.9*  --   --   ALBUMIN  2.2* 2.1* 2.4*  --   --   AST 10* 11* 11*  --   --   ALT 9 8 8   --   --   ALKPHOS 75 75 74  --   --   BILITOT 1.4* 0.8 0.9  --   --   GFRNONAA 14* 10* 13* 10* 8*  ANIONGAP 12 11 15 13 15     Lipids No results for input(s): "CHOL", "TRIG", "HDL", "LABVLDL", "LDLCALC", "CHOLHDL" in the last 168 hours.  Hematology Recent Labs  Lab 11/14/23 0435 11/15/23 0544 11/16/23 0506  WBC 13.2* 16.0* 15.5*  RBC 3.18* 3.20* 3.11*  HGB 8.8* 8.5* 8.5*  HCT 29.0* 29.4* 28.1*  MCV 91.2 91.9 90.4  MCH 27.7 26.6 27.3  MCHC 30.3 28.9* 30.2  RDW 18.6* 18.6* 18.8*  PLT 109* 127* 152   Thyroid   Recent Labs  Lab 11/11/23 1441 11/12/23 0722  TSH 4.577*  --   FREET4  --  1.41*    BNPNo results for input(s): "BNP", "PROBNP" in the last 168 hours.  DDimer No results for input(s): "DDIMER" in the last 168 hours.   Radiology    DG CHEST PORT 1 VIEW Result Date:  11/14/2023 CLINICAL DATA:  Encounter for nasogastric tube placement. EXAM: PORTABLE CHEST 1 VIEW COMPARISON:  11/14/2023. FINDINGS: Study is limited by significant rotational artifact. Enteric tube tip courses by below the level of the GE junction. Unchanged asymmetric elevation of the right hemidiaphragm. Similar appearance of diffuse increase interstitial markings. Decreased aeration to the left lung base is again noted and may reflect atelectasis versus airspace disease. IMPRESSION: 1. Enteric tube tip courses below the level of the GE junction. 2. Unchanged asymmetric elevation of the right hemidiaphragm. 3. Unchanged left basilar atelectasis versus airspace disease. Electronically Signed   By: Kimberley Penman M.D.   On: 11/14/2023 14:20   DG Chest Port 1 View Result Date: 11/14/2023 CLINICAL DATA:  Nasogastric verification EXAM: PORTABLE CHEST 1 VIEW COMPARISON:  11/03/2023 FINDINGS: Nasogastric tube enters the abdomen with its tip in the expected location of the mid body of the stomach. This should be good to use. Artifact otherwise overlying the region. IMPRESSION: Nasogastric tube tip in the expected location of the mid body of the stomach. Electronically Signed   By: Bettylou Brunner M.D.   On: 11/14/2023 11:01    Cardiac Studies   09/2023 echo 1. Left ventricular ejection fraction, by estimation, is 60 to 65%. The  left ventricle has normal function. The left ventricle has no regional  wall motion abnormalities. Left ventricular diastolic parameters are  indeterminate. Elevated left ventricular  end-diastolic pressure.   2. Right ventricular systolic function is normal. The right ventricular  size is mildly enlarged.   3. The mitral valve is grossly normal. Trivial mitral valve  regurgitation. No evidence of mitral stenosis.   4. The aortic valve was not well visualized. Aortic valve regurgitation  is not visualized. No aortic stenosis is present.     Assessment & Plan    1.Afib with  RVR - new diagnosis this admission in setting of cellulitis - low bp on dilt gtt, changed to amio gtt and subsequently to oral 200mg  bid. She is on lopressor  25mg  every 6 hrs though some held doses due to bp, also midodrine  10mg  tid to support bp. BP's labile, monitor and wean midodrine  as able. Remains in afib, heart rates  well controlled.  - started on hep gtt. Of note history of prior splenic infarct, was already on eliquis  at home.  - may consider DCCV as outpatietn when acute illness has resolved if remains in afib over time.    2. Elevated troponin - peak 330 in setting of afib with RVR, ESRD, cellulitis.  - 09/2023 echo LVEF 60-65%, , no WMAs  3. ESRD  4. Encephalopathy - per primary team  We will follow telemetry and follow patient remotely tomorrow    For questions or updates, please contact [facility] Please consult [email or web address] for contact info under        Signed, Armida Lander, MD  11/16/2023, 8:38 AM

## 2023-11-16 NOTE — Progress Notes (Signed)
 Spoke with RN about whether patient could possibly go on CPAP.  Patient has AMS at this time and it would probably not be a good for patient to be on CPAP at this time due to AMS.  Patient had been requiring a sitter and is currently in restraints.

## 2023-11-16 NOTE — Progress Notes (Signed)
 Nutrition Follow-up  DOCUMENTATION CODES:   Not applicable  INTERVENTION:   Continue TF via NG tube: Nepro at 55 ml/h Provides 2376 kcal, 107 gm protein, 960 ml free water  daily.  Continue Renal MVI daily via tube.   NUTRITION DIAGNOSIS:   Increased nutrient needs related to chronic illness as evidenced by estimated needs.  Ongoing   GOAL:   Patient will meet greater than or equal to 90% of their needs  Met with TF  MONITOR:   TF tolerance, Labs  REASON FOR ASSESSMENT:   Consult Enteral/tube feeding initiation and management  ASSESSMENT:   58 y.o. female with medical history significant of  ESRD on HD (MWF) COPD, hypertension, NASH, AOCD, left eye blindness, type 2 diabetes mellitus with retinopathy, RLS, CHF who presents to the emergency department due to lower abdominal pain, nausea, NBNB vomiting.  Patient asleep in bed, unable to provide any nutrition information. She is receiving Nepro at 55 ml/h via NG tube, tolerating well to meet 100% of estimated nutrition needs. Remains on a renal carb modified diet with 1200 ml fluid restriction. Meal intakes 0% for the past 2 days, 20-25% for the 3 days prior to that.  Palliative care team is following with plans to continue full scope of care.  Concern for calciphylaxis / thyroid  mass noted.  Cardiology following for new A fib with RVR in the setting of cellulitis. Receiving IV antibiotics for R buttock cellulitis.  Labs reviewed.  CBG: 161-163  Medications reviewed and include calcitriol , sensipar , aranesp , novolog , renal MVI, protonix , velphoro .   Admit weight 118.4 kg (4/8) Current weight 108 kg (4/21) Lowest weight since admission 107 kg (4/18) EDW 120.5 kg per Nephrology notes  Last HD 4/18, 600 ml volume removed. Patient has been combative while on HD.  Next HD planned for today. UOP 0 ml x 24 h  Intake/Output Summary (Last 24 hours) at 11/16/2023 1203 Last data filed at 11/16/2023 0900 Gross per 24 hour   Intake 2049.2 ml  Output --  Net 2049.2 ml   Net IO Since Admission: 1,732.47 mL [11/16/23 1203]   NUTRITION - FOCUSED PHYSICAL EXAM:  Flowsheet Row Most Recent Value  Orbital Region No depletion  Upper Arm Region No depletion  Thoracic and Lumbar Region No depletion  Buccal Region Mild depletion  Temple Region Mild depletion  Clavicle Bone Region No depletion  Clavicle and Acromion Bone Region No depletion  Scapular Bone Region Unable to assess  Dorsal Hand No depletion  Patellar Region No depletion  Anterior Thigh Region No depletion  Posterior Calf Region Mild depletion  Edema (RD Assessment) Mild  Hair Reviewed  Eyes Unable to assess  Mouth Unable to assess  Skin Reviewed  Nails Reviewed       Diet Order:   Diet Order             Diet renal/carb modified with fluid restriction Diet-HS Snack? Nothing; Fluid restriction: 1200 mL Fluid; Room service appropriate? Yes; Fluid consistency: Thin  Diet effective now                   EDUCATION NEEDS:   Not appropriate for education at this time  Skin:  Skin Assessment: Skin Integrity Issues: Skin Integrity Issues:: Unstageable, Other (Comment) Unstageable: right thigh Other: cellulitis to R buttock  Last BM:  4/20 type 6/7  Height:   Ht Readings from Last 1 Encounters:  11/11/23 5\' 4"  (1.626 m)    Weight:   Wt Readings from Last  1 Encounters:  11/16/23 108 kg    Ideal Body Weight:  54.5 kg  BMI:  Body mass index is 40.87 kg/m.  Estimated Nutritional Needs:   Kcal:  2300-2500  Protein:  85-105g  Fluid:  IL + UOP   Barnet Boots RD, LDN, CNSC Contact via secure chat. If unavailable, use group chat "RD Inpatient."

## 2023-11-17 ENCOUNTER — Other Ambulatory Visit: Payer: Self-pay

## 2023-11-17 ENCOUNTER — Inpatient Hospital Stay (HOSPITAL_COMMUNITY)

## 2023-11-17 DIAGNOSIS — Z515 Encounter for palliative care: Secondary | ICD-10-CM | POA: Diagnosis not present

## 2023-11-17 DIAGNOSIS — Z7189 Other specified counseling: Secondary | ICD-10-CM

## 2023-11-17 DIAGNOSIS — J9601 Acute respiratory failure with hypoxia: Secondary | ICD-10-CM | POA: Diagnosis not present

## 2023-11-17 DIAGNOSIS — R509 Fever, unspecified: Secondary | ICD-10-CM | POA: Diagnosis not present

## 2023-11-17 LAB — CBC
HCT: 29.2 % — ABNORMAL LOW (ref 36.0–46.0)
HCT: 32.2 % — ABNORMAL LOW (ref 36.0–46.0)
Hemoglobin: 8.9 g/dL — ABNORMAL LOW (ref 12.0–15.0)
Hemoglobin: 9.4 g/dL — ABNORMAL LOW (ref 12.0–15.0)
MCH: 27.6 pg (ref 26.0–34.0)
MCH: 27.6 pg (ref 26.0–34.0)
MCHC: 30.5 g/dL (ref 30.0–36.0)
MCHC: 29.2 g/dL — ABNORMAL LOW (ref 30.0–36.0)
MCV: 90.4 fL (ref 80.0–100.0)
MCV: 94.7 fL (ref 80.0–100.0)
Platelets: 154 10*3/uL (ref 150–400)
Platelets: ADEQUATE 10*3/uL (ref 150–400)
RBC: 3.23 MIL/uL — ABNORMAL LOW (ref 3.87–5.11)
RBC: 3.4 MIL/uL — ABNORMAL LOW (ref 3.87–5.11)
RDW: 19 % — ABNORMAL HIGH (ref 11.5–15.5)
RDW: 19.5 % — ABNORMAL HIGH (ref 11.5–15.5)
WBC: 16.1 10*3/uL — ABNORMAL HIGH (ref 4.0–10.5)
WBC: 22.6 10*3/uL — ABNORMAL HIGH (ref 4.0–10.5)
nRBC: 0.2 % (ref 0.0–0.2)
nRBC: 1.2 % — ABNORMAL HIGH (ref 0.0–0.2)

## 2023-11-17 LAB — BASIC METABOLIC PANEL WITH GFR
Anion gap: 18 — ABNORMAL HIGH (ref 5–15)
BUN: 35 mg/dL — ABNORMAL HIGH (ref 6–20)
CO2: 24 mmol/L (ref 22–32)
Calcium: 9.9 mg/dL (ref 8.9–10.3)
Chloride: 94 mmol/L — ABNORMAL LOW (ref 98–111)
Creatinine, Ser: 3.97 mg/dL — ABNORMAL HIGH (ref 0.44–1.00)
GFR, Estimated: 13 mL/min — ABNORMAL LOW (ref >60)
Glucose, Bld: 233 mg/dL — ABNORMAL HIGH (ref 70–99)
Potassium: 4 mmol/L (ref 3.5–5.1)
Sodium: 136 mmol/L (ref 135–145)

## 2023-11-17 LAB — BLOOD GAS, ARTERIAL
Acid-Base Excess: 2.5 mmol/L — ABNORMAL HIGH (ref 0.0–2.0)
Bicarbonate: 27.9 mmol/L (ref 20.0–28.0)
Drawn by: 23430
O2 Saturation: 100 %
Patient temperature: 39.5
pCO2 arterial: 50 mmHg — ABNORMAL HIGH (ref 32–48)
pH, Arterial: 7.36 (ref 7.35–7.45)
pO2, Arterial: 202 mmHg — ABNORMAL HIGH (ref 83–108)

## 2023-11-17 LAB — COMPREHENSIVE METABOLIC PANEL WITH GFR
ALT: 11 U/L (ref 0–44)
AST: 30 U/L (ref 15–41)
Albumin: 2.5 g/dL — ABNORMAL LOW (ref 3.5–5.0)
Alkaline Phosphatase: 84 U/L (ref 38–126)
Anion gap: 20 — ABNORMAL HIGH (ref 5–15)
BUN: 37 mg/dL — ABNORMAL HIGH (ref 6–20)
CO2: 19 mmol/L — ABNORMAL LOW (ref 22–32)
Calcium: 9.7 mg/dL (ref 8.9–10.3)
Chloride: 93 mmol/L — ABNORMAL LOW (ref 98–111)
Creatinine, Ser: 4.42 mg/dL — ABNORMAL HIGH (ref 0.44–1.00)
GFR, Estimated: 11 mL/min — ABNORMAL LOW (ref >60)
Glucose, Bld: 246 mg/dL — ABNORMAL HIGH (ref 70–99)
Potassium: 4.6 mmol/L (ref 3.5–5.1)
Sodium: 132 mmol/L — ABNORMAL LOW (ref 135–145)
Total Bilirubin: 1.2 mg/dL (ref 0.0–1.2)
Total Protein: 6.6 g/dL (ref 6.5–8.1)

## 2023-11-17 LAB — MAGNESIUM
Magnesium: 1.9 mg/dL (ref 1.7–2.4)
Magnesium: 2.3 mg/dL (ref 1.7–2.4)

## 2023-11-17 LAB — GLUCOSE, CAPILLARY
Glucose-Capillary: 165 mg/dL — ABNORMAL HIGH (ref 70–99)
Glucose-Capillary: 182 mg/dL — ABNORMAL HIGH (ref 70–99)
Glucose-Capillary: 214 mg/dL — ABNORMAL HIGH (ref 70–99)
Glucose-Capillary: 221 mg/dL — ABNORMAL HIGH (ref 70–99)

## 2023-11-17 LAB — HEPARIN LEVEL (UNFRACTIONATED): Heparin Unfractionated: <0.1 [IU]/mL — ABNORMAL LOW (ref 0.30–0.70)

## 2023-11-17 LAB — PARATHYROID HORMONE, INTACT (NO CA): PTH: 176 pg/mL — ABNORMAL HIGH (ref 15–65)

## 2023-11-17 MED ORDER — GLYCOPYRROLATE 1 MG PO TABS: 1.0000 mg | ORAL_TABLET | ORAL | Status: DC | PRN

## 2023-11-17 MED ORDER — ACETAMINOPHEN 650 MG RE SUPP: 650.0000 mg | Freq: Four times a day (QID) | RECTAL | Status: DC | PRN

## 2023-11-17 MED ORDER — ONDANSETRON HCL 4 MG/2ML IJ SOLN: 4.0000 mg | Freq: Four times a day (QID) | INTRAMUSCULAR | Status: DC | PRN

## 2023-11-17 MED ORDER — POLYVINYL ALCOHOL 1.4 % OP SOLN: 1.0000 [drp] | Freq: Four times a day (QID) | OPHTHALMIC | Status: DC | PRN

## 2023-11-17 MED ORDER — GLYCOPYRROLATE 0.2 MG/ML IJ SOLN: 0.2000 mg | INTRAMUSCULAR | Status: DC | PRN

## 2023-11-17 MED ORDER — LORAZEPAM 2 MG/ML IJ SOLN
1.0000 mg | INTRAMUSCULAR | Status: DC | PRN
  Administered 2023-11-17: 2 mg via INTRAVENOUS
  Filled 2023-11-17: qty 1

## 2023-11-17 MED ORDER — HYDROCORTISONE SOD SUC (PF) 100 MG IJ SOLR: 100.0000 mg | Freq: Three times a day (TID) | INTRAMUSCULAR | Status: DC

## 2023-11-17 MED ORDER — VANCOMYCIN HCL 750 MG/150ML IV SOLN: 750.0000 mg | INTRAVENOUS | Status: DC

## 2023-11-17 MED ORDER — ACETAMINOPHEN 325 MG PO TABS: 650.0000 mg | ORAL_TABLET | Freq: Four times a day (QID) | ORAL | Status: DC | PRN

## 2023-11-17 MED ORDER — PIPERACILLIN-TAZOBACTAM 3.375 G IVPB 30 MIN
3.3750 g | Freq: Once | INTRAVENOUS | Status: DC
  Filled 2023-11-17: qty 50

## 2023-11-17 MED ORDER — BIOTENE DRY MOUTH MT LIQD: 15.0000 mL | OROMUCOSAL | Status: DC | PRN

## 2023-11-17 MED ORDER — ONDANSETRON 4 MG PO TBDP: 4.0000 mg | ORAL_TABLET | Freq: Four times a day (QID) | ORAL | Status: DC | PRN

## 2023-11-17 MED ORDER — HYDROMORPHONE HCL 1 MG/ML IJ SOLN
1.0000 mg | INTRAMUSCULAR | Status: DC | PRN
  Administered 2023-11-17: 2 mg via INTRAVENOUS
  Filled 2023-11-17: qty 2

## 2023-11-17 MED ORDER — SODIUM CHLORIDE 0.9 % IV SOLN
INTRAVENOUS | Status: DC | PRN
Start: 2023-11-17 — End: 2023-11-17

## 2023-11-17 MED ORDER — NOREPINEPHRINE 4 MG/250ML-% IV SOLN
0.0000 ug/min | INTRAVENOUS | Status: DC
  Administered 2023-11-17: 2 ug/min via INTRAVENOUS
  Filled 2023-11-17 (×2): qty 250

## 2023-11-18 MED FILL — Medication: Qty: 1 | Status: AC

## 2023-11-22 LAB — CULTURE, BLOOD (ROUTINE X 2): Special Requests: ADEQUATE

## 2023-11-26 NOTE — Progress Notes (Signed)
 Pt found by RN to have no pulse and not breathing, CPR started at 0835. One amp epi given at [id number]. SWOT arrived at [phone] and took over recording. 0840 was pulse check and per Dr Amanda Jungling pt was A fib on monitor but PEA. CPR started. 5409, cpr stopped to intubate, 7.0 ETT placed by DR Larrie Po 22 at middle lip, with positive CO2 and xray on way. Pulse check during intubation was  afib PEA per Dr Amanda Jungling. CPR started again once intubation done at 0841.  2nd amp epi given at 0841. 0843 pulse check with ST with ROSC. 3rd amp given per vo DR [facility]. 8119 one amp bicarb given per vo Dr Mason Sole.

## 2023-11-26 NOTE — Progress Notes (Addendum)
 Palliative: Face-to-face discussion with bedside nursing staff, nephrology related to patient condition, needs.  Mrs. Depner suffered cardiac arrest this morning.  She had compressions and intubation, and it is likely that she aspirated tube feeding.  Although she had return of circulation, she is now on maximum vasopressor.  There is no family at bedside at this time.  Call to husband, Dawnelle Warman.  We talk in detail about Nasiya's acute health concerns, what has happened.  We talked about the acuity of her condition and prognosis.  We talked about her inability to have hemodialysis, her treatment yesterday.  After discussion, Alease Fait endorses DNR, no escalation of care.  He does agree for a few hours of care for possible stabilization.  Call from [employer] who shares that she is biological daughter.  We discussed Mrs. Oleski's acute health concerns.  She tearfully states that she is coming to the hospital to see her mother.  Return phone call to [employer].  We did talk about Mrs. Delellis's continued declines.  After discussion Mr. Pavon is agreement for comfort care after visit from daughter Avanell Bob.  He shares that he will try to come to the hospital but agrees that we would not wait for him to arrive for comfort care.  He does endorse that Raynette Cam is Mrs. Capelli's biological daughter and states agreement for her visit.  Face-to-face conference with bedside nursing staff related to compassionate extubation and symptom management needs.  Conference with attending, nephrology, bedside nursing staff, transition of care team related to patient condition, needs, goals of care, disposition.  Plan: Full comfort care once biological daughter, Avanell Bob, arrives.  Of extubation, to let nature take its course.  End-of-life order set implemented. Prognosis: Anticipate minutes, in-hospital death.  Addendum: Mrs. Cortez was extubated to comfort care.  Time of death 67.  Husband, Pola Furno notified by  this NP.  Condolences offered.  He tells me that funeral arrangements will be decided by daughter Avanell Bob.  70 minutes Arla Lab, NP  Palliative medicine team Team phone [phone]

## 2023-11-26 NOTE — Progress Notes (Signed)
 Received call from someone named Raynette Cam who states she is the biological daughter to the patient. Emotional support provided and Tasha NP on unit and made aware. [facility] phone number is [phone].

## 2023-11-26 NOTE — Progress Notes (Signed)
 Security collected valuables from patient purse with Clinical research associate present. This includes patient wallet with social security cards, visa debit card and drivers license. One Cell phone, Two lighters and medication medication bottle with a key in it also collected and sent down to secure locker. Per Oakley Bellman NP, patient husband asked for valuables to be put up until he can collect it.   Patient Martha [name] purse, medications (cream ointment, two tessalon  pearls capsules, benadryl  pills and immodium pill) placed in clear plastic bag and put back in purse. Pill bottle filled with teeth also placed in clear plastic bag and in purse. All of these were placed in patient belongings bag and labeled with her name to be with patient.

## 2023-11-26 NOTE — Progress Notes (Signed)
 PHARMACY - ANTICOAGULATION CONSULT NOTE  Pharmacy Consult for Heparin  Indication: atrial fibrillation  Allergies  Allergen Reactions   Codeine  Nausea And Vomiting   Tape Other (See Comments) and Rash    Pull skin off.  Paper tape is ok    Patient Measurements: Height: 5\' 4"  (162.6 cm) Weight: 115 kg (253 lb 8.5 oz) IBW/kg (Calculated) : 54.7 HEPARIN  DW (KG): 89.4  Vital Signs: Temp: 102.9 F (39.4 C) 12-05-23 0851) Temp Source: Axillary Dec 05, 2023 0731) BP: 129/39 Dec 05, 2023 0700) Pulse Rate: 123 Dec 05, 2023 0840)  Labs: Recent Labs    11/14/23 1322 11/15/23 0544 11/15/23 0544 11/16/23 0506 2023-12-05 0547 12/05/23 0906  HGB  --  8.5*   < > 8.5* 8.9*  --   HCT  --  29.4*  --  28.1* 29.2*  --   PLT  --  127*  --  152 154  --   APTT 92* 38*  --  75*  --   --   HEPARINUNFRC  --  0.85*  --  0.55 <0.10*  --   CREATININE  --  4.85*   < > 5.84* 3.97* 4.42*   < > = values in this interval not displayed.    Estimated Creatinine Clearance: 17.9 mL/min (A) (by C-G formula based on SCr of 4.42 mg/dL (H)).   Medical History: Past Medical History:  Diagnosis Date   Acid reflux    Anemia    Arthritis    Bipolar 1 disorder (HCC)    Blindness of left eye    left eye removed   Carpal tunnel syndrome, bilateral    Cervical radiculopathy    CHF (congestive heart failure) (HCC)    Constipation    COPD (chronic obstructive pulmonary disease) (HCC) 2014   Degenerative disc disease, thoracic    Depression    Diabetes mellitus    Type II   Diabetic retinopathy (HCC)    End stage kidney disease (HCC)    M, W, F [facility]   Gout    Hypertension    Non-alcoholic cirrhosis (HCC)    Noncompliance with medication regimen    Obesity (BMI 30-39.9)    OSA (obstructive sleep apnea)    cpap   Panic attack    RLS (restless legs syndrome)     Medications:  Medications Prior to Admission  Medication Sig Dispense Refill Last Dose/Taking   ADVAIR DISKUS 250-50 MCG/ACT AEPB Inhale 1  puff into the lungs 2 (two) times daily.   Past Week   albuterol  (PROVENTIL ) (2.5 MG/3ML) 0.083% nebulizer solution Take 3 mLs (2.5 mg total) by nebulization every 12 (twelve) hours as needed for wheezing or shortness of breath. 75 mL 0 Past Week   ALPRAZolam  (XANAX ) 1 MG tablet Take 1 tablet (1 mg total) by mouth 4 (four) times daily as needed for anxiety. (Patient taking differently: Take 1 mg by mouth 4 (four) times daily.) 9 tablet 0 11/01/2023   apixaban  (ELIQUIS ) 5 MG TABS tablet Take 2 tablets (10 mg total) by mouth 2 (two) times daily for 7 days, THEN 1 tablet (5 mg total) 2 (two) times daily for 23 days. 74 tablet 0 10/30/2023   Artificial Tear Ointment (DRY EYES OP) Place 1 drop into the right eye 2 (two) times daily.   11/01/2023   atorvastatin  (LIPITOR) 20 MG tablet Take 20 mg by mouth daily.   11/01/2023   atropine  (ISOPTO ATROPINE ) 1 % ophthalmic solution Place 1 drop into the left eye 3 (three) times daily.  11/01/2023   brimonidine  (ALPHAGAN ) 0.2 % ophthalmic solution Place 1 drop into the left eye 3 (three) times daily.   11/01/2023   busPIRone  (BUSPAR ) 7.5 MG tablet Take 1 tablet (7.5 mg total) by mouth 2 (two) times daily. 7 tablet 0 11/01/2023   cinacalcet  (SENSIPAR ) 60 MG tablet Take 60 mg by mouth daily.   11/01/2023   colchicine  0.6 MG tablet TAKE 1 TABLET(0.6 MG) BY MOUTH DAILY 30 tablet 0 11/01/2023   doxercalciferol  (HECTOROL ) 4 MCG/2ML injection Inject 0.5 mLs (1 mcg total) into the vein every Monday, Wednesday, and Friday with hemodialysis. 2 mL  10/30/2023   gabapentin  (NEURONTIN ) 100 MG capsule Take 1 capsule (100 mg total) by mouth at bedtime. (Patient taking differently: Take 300 mg by mouth at bedtime.) 7 capsule 0 11/01/2023   HYDROcodone -acetaminophen  (NORCO) 10-325 MG tablet Take 1 tablet by mouth 4 (four) times daily.   11/01/2023   insulin  glargine (LANTUS  SOLOSTAR) 100 UNIT/ML Solostar Pen Inject 30 Units into the skin daily.   11/01/2023   latanoprost  (XALATAN ) 0.005 % ophthalmic  solution Place 1 drop into the left eye 2 (two) times daily.   11/01/2023   omeprazole  (PRILOSEC) 40 MG capsule Take 40 mg by mouth daily.   11/01/2023   ondansetron  (ZOFRAN ) 4 MG tablet TAKE (1) TABLET THREE TIMES DAILY AS NEEDED FOR NAUSEA.   11/01/2023   PARoxetine  (PAXIL ) 20 MG tablet Take 1 tablet (20 mg total) by mouth every evening. This is to prevent panic attacks (Patient taking differently: Take 20 mg by mouth at bedtime.) 7 tablet 0 11/01/2023   VELPHORO  500 MG chewable tablet Chew 1,000 mg by mouth 3 (three) times daily.   11/01/2023   Blood Glucose Monitoring Suppl (FORA V30A BLOOD GLUCOSE SYSTEM) DEVI       FORACARE PREMIUM V10 TEST test strip        Assessment: Patient presented with buttock cellulitis. ON chronic anticoagulation with eliquis  for afib. Patient having rhythm changes and MD concerned. Diltiazem  infusion started, cardiology to evaluate. MD asked pharmacy to transition to heparin  for now. Will follow APTT and HL until correlates. Last dose of eliquis  4/16 at 0830   HL <0.1 - subtherapeutic - unexpected results Hgb 8.5- stable  Goal of Therapy:  Heparin  level 0.3-0.7 units/ml aPTT 66-102 seconds Monitor platelets by anticoagulation protocol: Yes    Plan: Continue heparin  infusion at 2150 units/hour and reorder labs for now to verify level.  Check heparin  levels daily now that correlation with aPTT. Continue to monitor CBC daily while on heparin  infusion.   Neal Baldy Student pharmacist 2023/11/26 at [phone]

## 2023-11-26 NOTE — Progress Notes (Signed)
 Respiratory Therapist received orders and a consent to insert an arterial line, Patient doppled positive for collateral circulation on right radial artery. Several attempts were performed once by Harold Like RRT and a second attempt by Anise Barlow RRT, both were unsuccessful with the insertion of the Aline while using sterile technique. Nurse at bedside and MD made aware. RT will continue to monitor and assess patient.

## 2023-11-26 NOTE — Progress Notes (Signed)
 At 1345 HR RT extubated patient per MD orders for comfort care . Patient's family and RN in the room.

## 2023-11-26 NOTE — Accreditation Note (Signed)
 Restraint death within 24 hours of removal of bilateral wrist restraints logged by Jaci Martini RN on 11/23/2023 at 0855.

## 2023-11-26 NOTE — Progress Notes (Signed)
~  8am: Pt was bathed and wound care performed after feces visibly excreting from would on right inner thigh. Left room to get medications at approximately 0830.   0835: Returned to room to find patient bradying down from 50s with agonal breathing. Code was initiated and patient quickly went into asystole. Compressions started @0835 . See note by Braulio Calender, SWOT RN for details of code.

## 2023-11-26 NOTE — Death Summary Note (Signed)
 DEATH SUMMARY   Patient Details  Name: Martha [name] MRN: [id number] DOB: 1966/07/21 [id number], Herminia Lope, NP Admission/Discharge Information   Admit Date:  Nov 12, 2023  Date of Death: Date of Death: 2023/11/26  Time of Death: Time of Death: 12/03/1350  Length of Stay: 2023-11-18   Principle Cause of death: Aspiration leading to PEA arrest, subsequent terminal extubation  Hospital Diagnoses: Principal Problem:   Acute febrile illness Active Problems:   GERD   Morbid obesity (HCC)   RESTLESS LEG SYNDROME   OSA on CPAP   Abdominal pain   Goals of care, counseling/discussion   Nausea & vomiting   Lactic acidosis   Thrombocytopenia (HCC)   Cellulitis of right buttock   Palliative care by specialist   End of life care   DNR (do not resuscitate) discussion   Hospital Course:  The patient is a 58 y.o. female with medical history significant of  ESRD on HD (MWF) COPD, hypertension, NASH, AOCD, left eye blindness, type 2 diabetes mellitus with retinopathy, RLS, CHF who presents to the emergency department due to lower abdominal pain, nausea, NBNB vomiting.  She was noted to have buttock cellulitis and fever and was started on empiric IV antibiotics with vancomycin  and Zosyn  and was switched to cefepime  and vancomycin  eventually.  She had significant wound noted, but with no drainable abscess noted on imaging or on examination at bedside.  Some mild debridement was performed by general surgery at bedside and she remained on IV antibiotics throughout the duration of her stay.  She continued to have trouble with hypersomnolence as well as acute encephalopathy related to opioid overdose and other workup including CT head was unremarkable.  She continued to have persistent encephalopathy and then also developed new onset atrial fibrillation with RVR with elevated troponin levels which required initiation of IV heparin  drip as well as amiodarone  infusion.  She was seen by cardiology and eventually switched over to  oral amiodarone  dosing.  Unfortunately, she did not have adequate oral intake given her persistent encephalopathic state and therefore required NG tube placement for tube feedings.  Given her ESRD, she did receive hemodialysis throughout her stay and continued to have trouble with ongoing mild fever as well as encephalopathy.  Unfortunately on 11/26/23 she had PEA arrest develop shortly after what appeared to be an aspiration event.  This occurred at approximately 0835.  She underwent CPR for approximately 8 minutes and had ROSC after 3 mg of epinephrine  were given.  She was maintained on pressors and had PICC line placed and was also intubated on mechanical ventilation.  Palliative care have further discussions with husband at bedside it was decided to make her DNR and not escalate care any further.  She was eventually made comfort care after further discussion with other family members to include her daughter.  Ultimately she was terminally extubated and expired on 11-26-23 at 1352.   Consultations: Cardiology Nephrology General Surgery Palliative care medicine  The results of significant diagnostics from this hospitalization (including imaging, microbiology, ancillary and laboratory) are listed below for reference.   Significant Diagnostic Studies: DG CHEST PORT 1 VIEW Result Date: 2023-11-26 CLINICAL DATA:  Check endotracheal tube placement EXAM: PORTABLE CHEST 1 VIEW COMPARISON:  11/14/2018 FINDINGS: Endotracheal tube is noted 4 cm above the carina in satisfactory position. Gastric catheter extends into the stomach. Heart is mildly enlarged. The lungs are well aerated without focal infiltrate. Mild central vascular congestion is noted. IMPRESSION: Mild CHF without significant edema. Tubes and lines in  satisfactory position. Electronically Signed   By: Violeta Grey M.D.   On: 12-17-23 10:54   US  EKG SITE RITE Result Date: 12/17/23 If [facility] image not attached, placement could not be  confirmed due to current cardiac rhythm.  US  THYROID  Result Date: 2023/12/17 CLINICAL DATA:  Enlarging left thyroid  nodule, previously biopsied EXAM: THYROID  ULTRASOUND TECHNIQUE: Ultrasound examination of the thyroid  gland and adjacent soft tissues was performed. COMPARISON:  CT 11/03/2023 and previous FINDINGS: Parenchymal Echotexture: Mildly heterogenous Isthmus: 0.7 cm thickness, previously 0.4 cm on 03/29/2015 Right lobe: 4.4 x 2.4 x 2.2 cm, previously 5.8 x 2.5 x 2.3 Left lobe: 5 x 3.4 x 3.5 cm, previously 5.3 x 1.9 x 2.5 _________________________________________________________ Estimated total number of nodules >/= 1 cm: 1 Number of spongiform nodules >/=  2 cm not described below (TR1): 0 Number of mixed cystic and solid nodules >/= 1.5 cm not described below (TR2): 0 _________________________________________________________ Nodule # 1: Prior biopsy: Yes Location: Left; mid Maximum size: 3.9 cm; Other 2 dimensions: 2.8 x 3.1 cm, previously, 1.6 x 1.2 x 1.3 cm Composition: mixed cystic and solid (1) Echogenicity: hypoechoic (2) Shape: not taller-than-wide (0) Margins: ill-defined (0) Echogenic foci: none (0) ACR TI-RADS total points: 3. ACR TI-RADS risk category:  TR 3. Significant change in size (>/= 20% in two dimensions and minimal increase of 2 mm): Yes Change in features: Yes Change in ACR TI-RADS risk category: Yes ACR TI-RADS recommendations: **Given size (>/= 2.5 cm) and appearance, fine needle aspiration of this mildly suspicious nodule should be considered based on TI-RADS criteria. _________________________________________________________ No regional cervical adenopathy identified. IMPRESSION: Enlarging 3.9 cm left mid thyroid  nodule, previously 1.6 cm. Consider FNA biopsy. The above is in keeping with the ACR TI-RADS recommendations - J Am Coll Radiol 2017;14:587-595. Electronically Signed   By: Nicoletta Barrier M.D.   On: Dec 17, 2023 08:35   DG CHEST PORT 1 VIEW Result Date: 11/14/2023 CLINICAL  DATA:  Encounter for nasogastric tube placement. EXAM: PORTABLE CHEST 1 VIEW COMPARISON:  11/14/2023. FINDINGS: Study is limited by significant rotational artifact. Enteric tube tip courses by below the level of the GE junction. Unchanged asymmetric elevation of the right hemidiaphragm. Similar appearance of diffuse increase interstitial markings. Decreased aeration to the left lung base is again noted and may reflect atelectasis versus airspace disease. IMPRESSION: 1. Enteric tube tip courses below the level of the GE junction. 2. Unchanged asymmetric elevation of the right hemidiaphragm. 3. Unchanged left basilar atelectasis versus airspace disease. Electronically Signed   By: Kimberley Penman M.D.   On: 11/14/2023 14:20   DG Chest Port 1 View Result Date: 11/14/2023 CLINICAL DATA:  Nasogastric verification EXAM: PORTABLE CHEST 1 VIEW COMPARISON:  11/03/2023 FINDINGS: Nasogastric tube enters the abdomen with its tip in the expected location of the mid body of the stomach. This should be good to use. Artifact otherwise overlying the region. IMPRESSION: Nasogastric tube tip in the expected location of the mid body of the stomach. Electronically Signed   By: Bettylou Brunner M.D.   On: 11/14/2023 11:01   CT HEAD WO CONTRAST ( ) Result Date: 11/10/2023 CLINICAL DATA:  Mental status change, unknown cause. EXAM: CT HEAD WITHOUT CONTRAST TECHNIQUE: Contiguous axial images were obtained from the base of the skull through the vertex without intravenous contrast. RADIATION DOSE REDUCTION: This exam was performed according to the departmental dose-optimization program which includes automated exposure control, adjustment of the mA and/or kV according to patient size and/or use of iterative reconstruction technique.  COMPARISON:  CT orbits October 04, 2019.  CT head Nov 29, 2018. FINDINGS: Brain: No evidence of acute infarction, hemorrhage, hydrocephalus, extra-axial collection or mass lesion/mass effect. Vascular: No hyperdense  vessel.  Calcific atherosclerosis. Skull: No acute fracture. Sinuses/Orbits: Stable chronic postoperative changes of the globes. Clear sinuses. Remote right medial orbital wall fracture. IMPRESSION: No evidence of acute intracranial abnormality. Electronically Signed   By: Stevenson Elbe M.D.   On: 11/10/2023 19:14   CT PELVIS WO CONTRAST Result Date: 11/07/2023 CLINICAL DATA:  Perianal abscess/fistula. EXAM: CT PELVIS WITHOUT CONTRAST TECHNIQUE: Multidetector CT imaging of the pelvis was performed following the standard protocol without intravenous contrast. RADIATION DOSE REDUCTION: This exam was performed according to the departmental dose-optimization program which includes automated exposure control, adjustment of the mA and/or kV according to patient size and/or use of iterative reconstruction technique. COMPARISON:  CT scan 11/03/2023 FINDINGS: Urinary Tract: Urinary bladder is decompressed. Despite the decompressed state, though urinary bladder wall appears thick and ill-defined raising the question of infection/inflammation. Bowel: No small bowel or colonic dilatation within the visualized pelvis. No substantial rectal wall thickening. While there is no definite perianal abscess or fistula, assessment is markedly limited by lack of intravenous contrast material and tiny abscess or tiny fistulous tract could be obscured. Vascular/Lymphatic: Atherosclerotic calcification noted in the distal aorta and common iliac arteries. No pelvic sidewall lymphadenopathy. 2 cm short axis right groin lymph node identified on image 111/2, increased in the interval since 11/03/2023. Reproductive: Calcification in the periphery of the uterus may be vascular. There is no adnexal mass. Other: Skin thickening and multiple calcified ill-defined areas of subcutaneous soft tissue nodularity may reflect sites of previous injection site/injection granulomata. Similar findings are seen in the buttocks regions bilaterally. There  is asymmetric edema/stranding in the inferior right buttocks region with apparent skin thickening on the right along the intergluteal fold. There appears to be asymmetric edema in the anterior, medial and posterior aspect of the proximal right thigh as well. Musculoskeletal: Degenerative changes are noted in both hips. IMPRESSION: 1. While there is no definite perianal abscess or fistula, assessment is markedly limited by lack of intravenous contrast material and tiny abscess or tiny fistulous tract could be obscured. 2. Asymmetric edema/stranding in the inferior right buttocks region with apparent skin thickening on the right along the inferior intergluteal fold. There is asymmetric edema/inflammation in the anterior, medial and posterior aspect of the proximal right thigh as well. Imaging features could be related to cellulitis. 3. 2 cm short axis right groin lymph node, increased in the interval since 11/03/2023. This is likely reactive but follow-up warranted to ensure resolution. Consider repeat CT in 3 months. Electronically Signed   By: Donnal Fusi M.D.   On: 11/07/2023 12:53   CT CHEST ABDOMEN PELVIS WO CONTRAST Addendum Date: 11/03/2023 ADDENDUM REPORT: 11/03/2023 22:36 ADDENDUM: Increased size of hypodense mass in the left lower lobe of thyroid , previously biopsied but given significant interval enlargement, consider emergent thyroid  ultrasound follow-up. Electronically Signed   By: Esmeralda Hedge M.D.   On: 11/03/2023 22:36   Result Date: 11/03/2023 CLINICAL DATA:  Sepsis body aches EXAM: CT CHEST, ABDOMEN AND PELVIS WITHOUT CONTRAST TECHNIQUE: Multidetector CT imaging of the chest, abdomen and pelvis was performed following the standard protocol without IV contrast. RADIATION DOSE REDUCTION: This exam was performed according to the departmental dose-optimization program which includes automated exposure control, adjustment of the mA and/or kV according to patient size and/or use of iterative  reconstruction technique. COMPARISON:  CT 10/20/2023, CT cervical spine 11/29/2018, 04/16/2015, 01/06/2023 FINDINGS: CT CHEST FINDINGS Cardiovascular: Limited evaluation without intravenous contrast. Mild atherosclerosis. No aneurysm. Coronary vascular calcification. Upper normal cardiac size. No pericardial effusion Mediastinum/Nodes: Patent trachea. Hypodense thyroid  mass in the left lobe, now measures 3.9 cm, previously 2.3 cm. No suspicious lymph nodes. Esophagus within normal limits. Lungs/Pleura: No acute airspace disease, pleural effusion or pneumothorax. Musculoskeletal: Sternum appears intact. Irregular degenerative changes at T10-T11 which are chronic. No acute osseous abnormality CT ABDOMEN PELVIS FINDINGS Hepatobiliary: No focal liver abnormality is seen. Status post cholecystectomy. No biliary dilatation. Pancreas: Atrophic.  No inflammation Spleen: Enlarged, AP measurement of 19 cm. Adrenals/Urinary Tract: Adrenal glands are within normal limits. Atrophic kidneys. No hydronephrosis. Bladder decompressed Stomach/Bowel: Stomach nonenlarged. No dilated small bowel. No acute bowel wall thickening. Negative appendix Vascular/Lymphatic: Extensive vascular disease. No aneurysm. No suspicious lymph nodes. Reproductive: Uterus and bilateral adnexa are unremarkable. Other: Negative for pelvic effusion or free air. Diffuse generalized subcutaneous edema. Musculoskeletal: Erosive changes at the pubic symphysis left greater than right stable compared to recent priors, progressive compared to 2024. Large Schmorl's node at L1. IMPRESSION: 1. Negative for acute intrathoracic or intra-abdominal/pelvic abnormality. 2. Marked splenomegaly.  Atrophic kidneys. 3. Diffuse generalized subcutaneous edema. 4. Erosive changes at the pubic symphysis left greater than right stable compared to recent priors, progressive compared to 2024. Findings could be secondary to inflammatory pubis osteitis or infection. 5. Aortic  atherosclerosis. Electronically Signed: By: Esmeralda Hedge M.D. On: 11/03/2023 21:33   DG Chest Port 1 View Result Date: 11/03/2023 CLINICAL DATA:  Questionable sepsis - evaluate for abnormality Diffuse pain.  Missed dialysis. EXAM: PORTABLE CHEST 1 VIEW COMPARISON:  09/22/2023 FINDINGS: Stable cardiomegaly. Unchanged mediastinal contours. Improvement in interstitial thickening from prior likely improving edema. No confluent airspace disease. No large pleural effusion. IMPRESSION: Cardiomegaly with improvement in interstitial thickening from prior, likely improving edema. Electronically Signed   By: Chadwick Colonel M.D.   On: 11/03/2023 20:03   ECHOCARDIOGRAM COMPLETE Result Date: 10/21/2023    ECHOCARDIOGRAM REPORT   Patient Name:   INICE SANLUIS Date of Exam: 10/21/2023 Medical Rec #:  [id number]     Height:       67.0 in Accession #:    [id number]    Weight:       260.0 lb Date of Birth:  1966-01-22     BSA:          2.261 m Patient Age:    57 years      BP:           166/94 mmHg Patient Gender: F             HR:           65 bpm. Exam Location:  [facility] Procedure: 2D Echo, Cardiac Doppler and Color Doppler (Both Spectral and Color            Flow Doppler were utilized during procedure). Indications:    Splenic infarct  History:        Patient has prior history of Echocardiogram examinations, most                 recent 11/05/2022. CHF, Abnormal ECG; Risk Factors:Sleep Apnea,                 Hypertension, Diabetes and Dyslipidemia. PAD, ESRD.  Sonographer:    Juanita Shaw Referring Phys: [id number] Wynetta Heckle  Sonographer Comments: Image acquisition challenging due to uncooperative patient. IMPRESSIONS  1. Left ventricular ejection fraction, by estimation, is 60 to 65%. The left ventricle has normal function. The left ventricle has no regional wall motion abnormalities. Left ventricular diastolic parameters are indeterminate. Elevated left ventricular end-diastolic pressure.  2. Right ventricular systolic  function is normal. The right ventricular size is mildly enlarged.  3. The mitral valve is grossly normal. Trivial mitral valve regurgitation. No evidence of mitral stenosis.  4. The aortic valve was not well visualized. Aortic valve regurgitation is not visualized. No aortic stenosis is present. Comparison(s): No significant change from prior study. FINDINGS  Left Ventricle: Left ventricular ejection fraction, by estimation, is 60 to 65%. The left ventricle has normal function. The left ventricle has no regional wall motion abnormalities. Strain was performed and the global longitudinal strain is indeterminate. The left ventricular internal cavity size was normal in size. There is no left ventricular hypertrophy. Left ventricular diastolic parameters are indeterminate. Elevated left ventricular end-diastolic pressure. Right Ventricle: The right ventricular size is mildly enlarged. No increase in right ventricular wall thickness. Right ventricular systolic function is normal. Left Atrium: Left atrial size was normal in size. Right Atrium: Right atrial size was normal in size. Pericardium: There is no evidence of pericardial effusion. Mitral Valve: The mitral valve is grossly normal. Trivial mitral valve regurgitation. No evidence of mitral valve stenosis. MV peak gradient, 8.1 mmHg. The mean mitral valve gradient is 2.0 mmHg. Tricuspid Valve: The tricuspid valve is grossly normal. Tricuspid valve regurgitation is mild . No evidence of tricuspid stenosis. Aortic Valve: The aortic valve was not well visualized. Aortic valve regurgitation is not visualized. No aortic stenosis is present. Aortic valve mean gradient measures 6.0 mmHg. Aortic valve peak gradient measures 13.1 mmHg. Aortic valve area, by VTI measures 2.31 cm. Pulmonic Valve: The pulmonic valve was not well visualized. Pulmonic valve regurgitation is trivial. No evidence of pulmonic stenosis. Aorta: The aortic root and ascending aorta are structurally  normal, with no evidence of dilitation. Venous: The inferior vena cava was not well visualized. IAS/Shunts: The interatrial septum was not assessed. Additional Comments: 3D was performed not requiring image post processing on an independent workstation and was indeterminate.  LEFT VENTRICLE PLAX 2D LVIDd:         5.60 cm      Diastology LVIDs:         3.40 cm      LV e' medial:    5.77 cm/s LV PW:         0.70 cm      LV E/e' medial:  27.0 LV IVS:        0.70 cm      LV e' lateral:   8.70 cm/s LVOT diam:     2.10 cm      LV E/e' lateral: 17.9 LV SV:         88 LV SV Index:   39 LVOT Area:     3.46 cm  LV Volumes (MOD) LV vol d, MOD A2C: 140.0 ml LV vol d, MOD A4C: 162.0 ml LV vol s, MOD A2C: 50.1 ml LV vol s, MOD A4C: 68.5 ml LV SV MOD A2C:     89.9 ml LV SV MOD A4C:     162.0 ml LV SV MOD BP:      88.5 ml RIGHT VENTRICLE RV Basal diam:  4.40 cm RV Mid diam:    3.00 cm RV S prime:     11.20 cm/s TAPSE (M-mode): 2.4 cm LEFT ATRIUM  Index        RIGHT ATRIUM           Index LA diam:        4.00 cm 1.77 cm/m   RA Area:     17.90 cm LA Vol (A2C):   37.7 ml 16.68 ml/m  RA Volume:   51.30 ml  22.69 ml/m LA Vol (A4C):   52.5 ml 23.22 ml/m LA Biplane Vol: 44.7 ml 19.77 ml/m  AORTIC VALVE                     PULMONIC VALVE AV Area (Vmax):    2.30 cm      PV Vmax:          1.01 m/s AV Area (Vmean):   2.01 cm      PV Peak grad:     4.1 mmHg AV Area (VTI):     2.31 cm      PR End Diast Vel: 7.73 msec AV Vmax:           181.00 cm/s AV Vmean:          109.000 cm/s AV VTI:            0.379 m AV Peak Grad:      13.1 mmHg AV Mean Grad:      6.0 mmHg LVOT Vmax:         120.00 cm/s LVOT Vmean:        63.300 cm/s LVOT VTI:          0.253 m LVOT/AV VTI ratio: 0.67  AORTA Ao Root diam: 3.20 cm Ao Asc diam:  2.80 cm MITRAL VALVE                TRICUSPID VALVE MV Area (PHT): 4.46 cm     TR Peak grad:   25.0 mmHg MV Area VTI:   2.67 cm     TR Vmax:        250.00 cm/s MV Peak grad:  8.1 mmHg MV Mean grad:  2.0 mmHg      SHUNTS MV Vmax:       1.42 m/s     Systemic VTI:  0.25 m MV Vmean:      58.4 cm/s    Systemic Diam: 2.10 cm MV Decel Time: 170 msec MV E velocity: 156.00 cm/s Vishnu Priya Mallipeddi Electronically signed by Lucetta Russel Mallipeddi Signature Date/Time: 10/21/2023/11:41:56 AM    Final    CT ABDOMEN PELVIS W CONTRAST Result Date: 10/20/2023 CLINICAL DATA:  Abdominal pain. EXAM: CT ABDOMEN AND PELVIS WITH CONTRAST TECHNIQUE: Multidetector CT imaging of the abdomen and pelvis was performed using the standard protocol following bolus administration of intravenous contrast. RADIATION DOSE REDUCTION: This exam was performed according to the departmental dose-optimization program which includes automated exposure control, adjustment of the mA and/or kV according to patient size and/or use of iterative reconstruction technique. CONTRAST:  OMNIPAQUE  IOHEXOL  300 MG/ML  SOLN COMPARISON:  CT abdomen pelvis dated 09/21/2023. FINDINGS: Lower chest: The visualized lung bases are clear. There is coronary vascular calcification. No intra-abdominal free air or free fluid. Hepatobiliary: The liver is unremarkable. No biliary dilatation. Cholecystectomy. No retained calcified stone noted in the central CBD. Pancreas: Unremarkable. No pancreatic ductal dilatation or surrounding inflammatory changes. Spleen: Splenomegaly measuring 17 cm in length. Which shaped peripheral hypoenhancement in the midportion of the spleen most consistent with an infarct and seen on the prior CT. Adrenals/Urinary Tract: The adrenal glands are  unremarkable. Severe bilateral renal parenchyma atrophy. No hydronephrosis. The visualized ureters appear unremarkable. The urinary bladder is minimally distended. Stomach/Bowel: There is no bowel obstruction or active inflammation. The appendix is normal. Vascular/Lymphatic: Mild aortoiliac atherosclerotic disease. The IVC is unremarkable. No portal venous gas. Diffuse calcification of the mesenteric vessels. No  adenopathy. Reproductive: The uterus is anteverted and grossly unremarkable. No suspicious adnexal masses. Other: There is diffuse skin thickening and subcutaneous edema of the anterior pelvic wall. No fluid collection. Musculoskeletal: Osteopenia with degenerative changes and findings of renal osteodystrophy. Fragmentation of the pubic symphysis as seen previously may represent osteomyelitis. Clinical correlation is recommended. No acute osseous pathology. IMPRESSION: 1. No bowel obstruction. Normal appendix. 2. Possible cellulitis of the anterior pelvic wall. No abscess or soft tissue gas. 3. Fragmentation of the pubic symphysis as seen previously may represent osteomyelitis. 4. Splenomegaly with a mid splenic infarct as seen previously. 5. Severe bilateral renal parenchyma atrophy. No hydronephrosis. Electronically Signed   By: Angus Bark M.D.   On: 10/20/2023 16:00    Microbiology: Recent Results (from the past 240 hours)  MRSA Next Gen by PCR, Nasal     Status: Abnormal   Collection Time: 11/11/23  2:06 PM   Specimen: Nasal Mucosa; Nasal Swab  Result Value Ref Range Status   MRSA by PCR Next Gen DETECTED (A) NOT DETECTED Final    Comment: RESULT CALLED TO, READ BACK BY AND VERIFIED WITH: JENNIFER KING @ 1731 ON 11/11/23 C VARNER (NOTE) The GeneXpert MRSA Assay (FDA approved for NASAL specimens only), is one component of a comprehensive MRSA colonization surveillance program. It is not intended to diagnose MRSA infection nor to guide or monitor treatment for MRSA infections. Test performance is not FDA approved in patients less than 20 years old. Performed at [facility], [address], [place], [place] [address]   Culture, blood (Routine X 2) w Reflex to ID Panel     Status: None (Preliminary result)   Collection Time: 2023-12-08  9:46 AM   Specimen: BLOOD  Result Value Ref Range Status   Specimen Description BLOOD RIGHT ANTECUBITAL  Final   Special Requests   Final    BOTTLES  DRAWN AEROBIC AND ANAEROBIC Blood Culture adequate volume Performed at [facility], [address] [place], [place] [address]    Culture PENDING  Incomplete   Report Status PENDING  Incomplete    Time spent: 40 minutes  Signed: Cornelius Dill, DO 12-08-2023

## 2023-11-26 NOTE — Progress Notes (Signed)
 Telemetry reviewed, afib with overall controlled rates. She remains on amio 200mg  bid, lopressor  25mg  q 6 hrs. Midodrine  added to help support bp's. No additional cardiology recs today, continue current cardiac regimen.   Armida Lander MD

## 2023-11-26 NOTE — Progress Notes (Signed)
 CODE BLUE NOTE  Responded to CODE BLUE after patient noted to have no pulse and no breathing at approximately 0835 this a.m.  Dr. Larrie Po of general surgery and Dr. Amanda Jungling with cardiology already in the room upon my arrival and CPR was in progress with 1 amp of epi given at [phone].  She was noted to be in PEA arrest suspected to have started after an aspiration event.  She continued to receive high-quality chest compressions and total 3 doses of epinephrine  followed by 1 amp of bicarbonate.  She was intubated by Dr. Larrie Po.  Pulse check performed at 0843 with ROSC noted.  Palliative in further discussion with family members regarding goals of care and patient is now DNR.  Anticipate transition to full comfort care soon with desire to not escalate vasopressor use at this time.  A-line attempts were made, but have failed.  Total critical care time: 47 minutes.

## 2023-11-26 NOTE — Progress Notes (Signed)
  Pharmacy Antibiotic Note  Martha [name] is a 58 y.o. female admitted on 11/03/2023 presenting with buttock cellulitis and placed on antibiotics. PMH of ESRD on HD (MWF), COPD, HTN, NASH, T2DM with retinopathy. Was undergoing HD on 4/16 when pt went into Afib and transferred to ICU on heparin  drip and amiodarone . Code blue 03-Dec-2023, intubated and found gastric contents in the back of her throat which was suctioned free. Pharmacy has been consulted for Zosyn  dosing for aspiration pneumonia.  Scr: 3.97, WBC 16.1, T 102.9  Plan: Vanco level: 24 Decrease vanco to 750mg  with HD Start Zosyn  2.25 GM IV q8h  Monitor labs, C/S, and vanc levels as needed.   Height: 5\' 4"  (162.6 cm) Weight: 115 kg (253 lb 8.5 oz) IBW/kg (Calculated) : 54.7  Temp (24hrs), Avg:100.5 F (38.1 C), Min:98.6 F (37 C), Max:102.9 F (39.4 C)  Recent Labs  Lab 11/11/23 1646 11/11/23 1902 11/12/23 0551 11/13/23 0413 11/14/23 0435 11/15/23 0544 11/16/23 0506 11/16/23 1904 12/03/23 0547  WBC  --   --    < > 14.2* 13.2* 16.0* 15.5*  --  16.1*  CREATININE  --   --    < > 4.73* 3.84* 4.85* 5.84*  --  3.97*  LATICACIDVEN 1.0 1.1  --   --   --   --   --   --   --   VANCOTROUGH  --   --   --   --   --   --   --  24*  --    < > = values in this interval not displayed.    Estimated Creatinine Clearance: 19.9 mL/min (A) (by C-G formula based on SCr of 3.97 mg/dL (H)).    Allergies  Allergen Reactions   Codeine  Nausea And Vomiting   Tape Other (See Comments) and Rash    Pull skin off.  Paper tape is ok    Antimicrobials this admission: Zosyn  12/03/2023 >> Cefepime  4/13 >> 2023-12-03 Vanco 4/8 >>4/10 restarted 4/13 >> Zosyn  4/8 >>4/10   Microbiology results: 12-03-2023 Bcx pending 4/8 BCx: ng 4/16 MRSA PCR: positive  Thank you for allowing pharmacy to be a part of this patient's care.  Neal Baldy Student Pharmacist 2023-12-03 9:07 AM

## 2023-11-26 NOTE — Progress Notes (Signed)
 1350: Pt compassionately extubated with family at bedside. Levo turned off. Pt had agonal breathing which stopped shortly after. TOD 1352 verified by two RN.

## 2023-11-26 NOTE — Progress Notes (Signed)
 Upon entering the ICU, patient had respiratory arrest and asystole.  I intubated the patient with a #7 endotracheal tube.  She did have gastric contents in the back of her throat which was suctioned free.  The ET tube was taped at the 22 cm Mckinzie Saksa.  Chest x-ray is pending.

## 2023-11-26 NOTE — Progress Notes (Addendum)
 Patient ID: Martha [name], female   DOB: 05-03-1966, 58 y.o.   MRN: [id number] S:pt found by RN without a pulse and not breathing.  CPR initiated and stopped for intubation by Dr. Larrie Po.  CPR resumed and after 2nd amp of pei ROSC.  Palliative care contacted husband and she is now DNR.  Now in atrial fibrillation with RVR and hypotensive. O:BP (!) 129/39   Pulse (!) 123   Temp (!) 102.9 F (39.4 C)   Resp (!) 23   Ht 5\' 4"  (1.626 m)   Wt 115 kg   LMP 08/03/2008 (Exact Date)   SpO2 (!) 79%   BMI 43.52 kg/m   Intake/Output Summary (Last 24 hours) at 12-12-2023 1011 Last data filed at 11/16/2023 2313 Gross per 24 hour  Intake 914.26 ml  Output 800 ml  Net 114.26 ml   Intake/Output: I/O last 3 completed shifts: In: 1079.3 [I.V.:474.3; NG/GT:605] Out: 800 [Other:800]  Intake/Output this shift:  No intake/output data recorded. Weight change: 7 kg Gen: intubated and sedated CVS: tachy and IRR in the 120's-130's Resp: ventilated breath sounds bilaterally Abd: obese, +BS, soft Ext: no edema, LUE AVF +T/B  Recent Labs  Lab 11/11/23 1320 11/12/23 0551 11/13/23 0413 11/14/23 0435 11/15/23 0544 11/16/23 0506 Dec 12, 2023 0547 2023-12-12 0906  NA 135 133* 133* 134* 135 135 136 132*  K 3.4* 3.3* 3.6 3.4* 3.7 3.6 4.0 4.6  CL 92* 95* 95* 93* 95* 96* 94* 93*  CO2 25 26 27 26 27 24 24  19*  GLUCOSE 175* 177* 162* 162* 145* 188* 233* 246*  BUN 43* 27* 35* 27* 39* 51* 35* 37*  CREATININE 6.12* 3.73* 4.73* 3.84* 4.85* 5.84* 3.97* 4.42*  ALBUMIN  1.9* 2.2* 2.1* 2.4*  --   --   --  2.5*  CALCIUM  9.2 8.9 8.9 9.5 9.5 9.8 9.9 9.7  PHOS 2.9 2.9  --   --   --   --   --   --   AST  --  10* 11* 11*  --   --   --  30  ALT  --  9 8 8   --   --   --  11   Liver Function Tests: Recent Labs  Lab 11/13/23 0413 11/14/23 0435 12-12-23 0906  AST 11* 11* 30  ALT 8 8 11   ALKPHOS 75 74 84  BILITOT 0.8 0.9 1.2  PROT 5.6* 5.9* 6.6  ALBUMIN  2.1* 2.4* 2.5*   No results for input(s): "LIPASE", "AMYLASE" in  the last 168 hours. No results for input(s): "AMMONIA" in the last 168 hours. CBC: Recent Labs  Lab 11/12/23 0551 11/13/23 0413 11/14/23 0435 11/15/23 0544 11/16/23 0506 12/12/2023 0547 12-12-23 0906  WBC 14.8*   < > 13.2* 16.0* 15.5* 16.1* 22.6*  NEUTROABS 13.0*  --   --   --   --   --   --   HGB 8.7*   < > 8.8* 8.5* 8.5* 8.9* 9.4*  HCT 29.0*   < > 29.0* 29.4* 28.1* 29.2* 32.2*  MCV 91.2   < > 91.2 91.9 90.4 90.4 94.7  PLT 107*   < > 109* 127* 152 154 PLATELET CLUMPS NOTED ON SMEAR, COUNT APPEARS ADEQUATE   < > = values in this interval not displayed.   Cardiac Enzymes: No results for input(s): "CKTOTAL", "CKMB", "CKMBINDEX", "TROPONINI" in the last 168 hours. CBG: Recent Labs  Lab 11/16/23 1942 12-12-23 0041 12/12/23 0407 12-12-2023 0734 12/12/2023 0846  GLUCAP 138* 165* 182* 221*  214*    Iron  Studies: No results for input(s): "IRON ", "TIBC", "TRANSFERRIN", "FERRITIN" in the last 72 hours. Studies/Results: US  EKG SITE RITE Result Date: 12/11/2023 If [facility] image not attached, placement could not be confirmed due to current cardiac rhythm.  US  THYROID  Result Date: Dec 11, 2023 CLINICAL DATA:  Enlarging left thyroid  nodule, previously biopsied EXAM: THYROID  ULTRASOUND TECHNIQUE: Ultrasound examination of the thyroid  gland and adjacent soft tissues was performed. COMPARISON:  CT 11/03/2023 and previous FINDINGS: Parenchymal Echotexture: Mildly heterogenous Isthmus: 0.7 cm thickness, previously 0.4 cm on 03/29/2015 Right lobe: 4.4 x 2.4 x 2.2 cm, previously 5.8 x 2.5 x 2.3 Left lobe: 5 x 3.4 x 3.5 cm, previously 5.3 x 1.9 x 2.5 _________________________________________________________ Estimated total number of nodules >/= 1 cm: 1 Number of spongiform nodules >/=  2 cm not described below (TR1): 0 Number of mixed cystic and solid nodules >/= 1.5 cm not described below (TR2): 0 _________________________________________________________ Nodule # 1: Prior biopsy: Yes Location: Left; mid  Maximum size: 3.9 cm; Other 2 dimensions: 2.8 x 3.1 cm, previously, 1.6 x 1.2 x 1.3 cm Composition: mixed cystic and solid (1) Echogenicity: hypoechoic (2) Shape: not taller-than-wide (0) Margins: ill-defined (0) Echogenic foci: none (0) ACR TI-RADS total points: 3. ACR TI-RADS risk category:  TR 3. Significant change in size (>/= 20% in two dimensions and minimal increase of 2 mm): Yes Change in features: Yes Change in ACR TI-RADS risk category: Yes ACR TI-RADS recommendations: **Given size (>/= 2.5 cm) and appearance, fine needle aspiration of this mildly suspicious nodule should be considered based on TI-RADS criteria. _________________________________________________________ No regional cervical adenopathy identified. IMPRESSION: Enlarging 3.9 cm left mid thyroid  nodule, previously 1.6 cm. Consider FNA biopsy. The above is in keeping with the ACR TI-RADS recommendations - J Am Coll Radiol 2017;14:587-595. Electronically Signed   By: Nicoletta Barrier M.D.   On: 12-11-23 08:35    amiodarone   200 mg Oral BID   atorvastatin   20 mg Oral Daily   bacitracin -polymyxin b    Left Eye BID   calcitRIOL   2 mcg Oral Q M,W,F   Chlorhexidine  Gluconate Cloth  6 each Topical Q0600   cinacalcet   30 mg Oral Q M,W,F   collagenase    Topical Daily   darbepoetin (ARANESP ) injection - DIALYSIS  100 mcg Subcutaneous Q Wed-1800   insulin  aspart  0-6 Units Subcutaneous Q4H   liver oil-zinc  oxide   Topical BID   metoprolol  tartrate  25 mg Oral Q6H   midodrine   10 mg Oral TID WC   multivitamin  1 tablet Per Tube QHS   mupirocin  ointment   Nasal BID   nystatin    Topical TID   pantoprazole  (PROTONIX ) IV  40 mg Intravenous Daily   sucroferric oxyhydroxide  1,000 mg Oral TID WC    BMET    Component Value Date/Time   NA 132 (L) 11-Dec-2023 0906   K 4.6 12-11-2023 0906   CL 93 (L) Dec 11, 2023 0906   CO2 19 (L) Dec 11, 2023 0906   GLUCOSE 246 (H) 2023/12/11 0906   BUN 37 (H) 11-Dec-2023 0906   CREATININE 4.42 (H) 11-Dec-2023 0906    CALCIUM  9.7 Dec 11, 2023 0906   CALCIUM  9.4 04/10/2017 0425   GFRNONAA 11 (L) 11-Dec-2023 0906   GFRAA 8 (L) 03/06/2020 0215   CBC    Component Value Date/Time   WBC 22.6 (H) Dec 11, 2023 0906   RBC 3.40 (L) 2023-12-11 0906   HGB 9.4 (L) 12/11/2023 0906   HGB 11.5 03/16/2013 0000  HCT 32.2 (L) 2023/11/25 0906   HCT 29.3 (L) 04/06/2017 0357   HCT 36 03/16/2013 0000   PLT  11-25-23 0906    PLATELET CLUMPS NOTED ON SMEAR, COUNT APPEARS ADEQUATE   MCV 94.7 11/25/2023 0906   MCV 87.8 03/16/2013 0000   MCH 27.6 25-Nov-2023 0906   MCHC 29.2 (L) 11-25-23 0906   RDW 19.5 (H) 2023-11-25 0906   LYMPHSABS 0.9 11/12/2023 0551   MONOABS 0.4 11/12/2023 0551   EOSABS 0.2 11/12/2023 0551   BASOSABS 0.0 11/12/2023 0551   Outpatient dialysis prescription:  [facility] MWF EDW 120.5kg  4hrs AVF BFR 400/DFR 500 Heparin  1000 bolus and 800/hr Venofer 50 weekly Mircera 200 q2wks Sensipar  30 qtx, calcitriol  2mcg qtx  Assessment/Plan:  PEA arrest - pt found by RN without a pulse and not breathing.  CPR initiated and stopped for intubation by Dr. Larrie Po.  CPR resumed and after 2nd amp of pei ROSC.  Palliative care contacted husband and she is now DNR.  Now in atrial fibrillation with RVR and hypotensive.  Ongoing discussions regarding transition to comfort care are ongoing with Palliative care. VDRF with aspiration of tube feeds - per primary svc  ESRD - normally on MWF at [facility].  She had to be restrained for HD yesterday due to thrashing and wailing.  She has repeatedly missed outpatient dialysis.  She is currently too unstable for IHD and is not appropriate for outpatient HD if she requires restraints.  I recommend transition to comfort care.  Discussed with ICU staff and Palliative care who are in agreement.  OK for PICC line as I do not believe she will survive this hospitalization. New onset Atrial fib with RVR - on amiodarone  but HR still in the 130's and hypotensive.   Cardiology following Buttock cellulitis - currently on vancomycin  and cefepime .  Wound care following.  General surgery following.  Acute metabolic encephalopathy/hypersomnolence - CT scan without IC abnormality. Thyroid  mass - hypodense on CT which has increased in size.  Per primary. DM type 2 - poorly controlled. Disposition - poor overall prognosis as stated above.  She is too unstable for IHD and had to be restrained at her last session yesterday.  Discussed with palliative care and agree with transition to comfort measures.  They are to discuss with husband.  She was made DNR this morning.  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

## 2023-11-26 NOTE — Progress Notes (Signed)
 Peripherally Inserted Central Catheter Placement  The IV Nurse has discussed with the patient and/or persons authorized to consent for the patient, the purpose of this procedure and the potential benefits and risks involved with this procedure.  The benefits include less needle sticks, lab draws from the catheter, and the patient may be discharged home with the catheter. Risks include, but not limited to, infection, bleeding, blood clot (thrombus formation), and puncture of an artery; nerve damage and irregular heartbeat and possibility to perform a PICC exchange if needed/ordered by physician.  Alternatives to this procedure were also discussed.  Bard Power PICC patient education guide, fact sheet on infection prevention and patient information card has been provided to patient /or left at bedside.  Pt intubated. Received consent from husband [name] who is in SNF at this time. 2nd witness Arla Lab  PICC Placement Documentation  PICC Triple Lumen 2023-11-30 Right Basilic 41 cm 1 cm (Active)  Indication for Insertion or Continuance of Line Administration of hyperosmolar/irritating solutions (i.e. TPN, Vancomycin , etc.) 11/30/23 1115  Exposed Catheter (cm) 1 cm 11/30/2023 1115  Site Assessment Clean, Dry, Intact 11/30/23 1115  Lumen #1 Status Flushed;Saline locked;Blood return noted 2023/11/30 1115  Lumen #2 Status Flushed;Saline locked;Blood return noted 11/30/23 1115  Lumen #3 Status Flushed;Saline locked;Blood return noted Nov 30, 2023 1115  Dressing Type Transparent 11/30/2023 1115  Dressing Status Antimicrobial disc/dressing in place 30-Nov-2023 1115  Line Care Lumen 1 cap changed;Lumen 3 cap changed;Lumen 2 cap changed 11/30/2023 1115  Dressing Change Due 11/24/23 Nov 30, 2023 1115       Martha [name] Martha [name] 2023-11-30, 11:57 AM

## 2023-11-26 NOTE — Progress Notes (Signed)
~  1220: Patient's daughter Avanell Bob arrived at bedside. After several minutes, Lori asked if she is able to take patients belongings, purse, and cellphone home with her after patient is compassionately extubated. Informed Avanell Bob that purse and cellphone are in security and must be obtained by husband. Pt then called Wyran while in the room to speak with him. During phone call, Sharrell Deck confirmed with this nurse that Avanell Bob can take purse and cellphone home with her. Husband verified by phone number and by stating full name. Security brought back patients belongings and were signed out as received by daughter Avanell Bob.

## 2023-11-26 NOTE — Progress Notes (Addendum)
 Dr Irene Mannheim gave verbal permission to start PICC in right arm due to low GFR and pt on dialysis.  See his note. RT at bedside attempting arterial line at this time.

## 2023-11-26 DEATH — deceased
